# Patient Record
Sex: Female | Born: 1945
Health system: Southern US, Community
[De-identification: ages and names within clinical notes are randomized; demographics above are authoritative.]

## PROBLEM LIST (undated history)

## (undated) DIAGNOSIS — N319 Neuromuscular dysfunction of bladder, unspecified: Secondary | ICD-10-CM

## (undated) DIAGNOSIS — I82432 Acute embolism and thrombosis of left popliteal vein: Secondary | ICD-10-CM

## (undated) DIAGNOSIS — N189 Chronic kidney disease, unspecified: Secondary | ICD-10-CM

## (undated) DIAGNOSIS — M549 Dorsalgia, unspecified: Secondary | ICD-10-CM

## (undated) DIAGNOSIS — F419 Anxiety disorder, unspecified: Secondary | ICD-10-CM

## (undated) DIAGNOSIS — R269 Unspecified abnormalities of gait and mobility: Secondary | ICD-10-CM

## (undated) DIAGNOSIS — G0490 Encephalitis and encephalomyelitis, unspecified: Secondary | ICD-10-CM

## (undated) DIAGNOSIS — B0082 Herpes simplex myelitis: Secondary | ICD-10-CM

## (undated) HISTORY — PX: ABDOMINAL HYSTERECTOMY: SHX81

## (undated) HISTORY — PX: BLADDER REPAIR: SHX76

## (undated) HISTORY — DX: Chronic kidney disease, unspecified: N18.9

## (undated) HISTORY — PX: TUBAL LIGATION: SHX77

## (undated) HISTORY — DX: Unspecified abnormalities of gait and mobility: R26.9

---

## 1998-01-31 ENCOUNTER — Other Ambulatory Visit: Admission: RE | Admit: 1998-01-31 | Discharge: 1998-01-31 | Payer: Self-pay | Admitting: Obstetrics and Gynecology

## 1999-02-01 ENCOUNTER — Other Ambulatory Visit: Admission: RE | Admit: 1999-02-01 | Discharge: 1999-02-01 | Payer: Self-pay | Admitting: Obstetrics and Gynecology

## 1999-11-21 ENCOUNTER — Inpatient Hospital Stay (HOSPITAL_COMMUNITY): Admission: RE | Admit: 1999-11-21 | Discharge: 1999-11-24 | Payer: Self-pay | Admitting: Obstetrics and Gynecology

## 1999-11-21 ENCOUNTER — Encounter (INDEPENDENT_AMBULATORY_CARE_PROVIDER_SITE_OTHER): Payer: Self-pay | Admitting: Specialist

## 2000-11-19 ENCOUNTER — Other Ambulatory Visit: Admission: RE | Admit: 2000-11-19 | Discharge: 2000-11-19 | Payer: Self-pay | Admitting: Obstetrics and Gynecology

## 2001-02-26 ENCOUNTER — Encounter: Payer: Self-pay | Admitting: *Deleted

## 2001-02-26 ENCOUNTER — Ambulatory Visit (HOSPITAL_COMMUNITY): Admission: RE | Admit: 2001-02-26 | Discharge: 2001-02-26 | Payer: Self-pay | Admitting: *Deleted

## 2001-03-27 ENCOUNTER — Ambulatory Visit (HOSPITAL_COMMUNITY): Admission: RE | Admit: 2001-03-27 | Discharge: 2001-03-27 | Payer: Self-pay | Admitting: Gastroenterology

## 2001-03-27 ENCOUNTER — Encounter (INDEPENDENT_AMBULATORY_CARE_PROVIDER_SITE_OTHER): Payer: Self-pay

## 2001-12-16 ENCOUNTER — Other Ambulatory Visit: Admission: RE | Admit: 2001-12-16 | Discharge: 2001-12-16 | Payer: Self-pay | Admitting: Obstetrics and Gynecology

## 2002-05-27 ENCOUNTER — Encounter: Payer: Self-pay | Admitting: Obstetrics and Gynecology

## 2002-05-27 ENCOUNTER — Encounter: Admission: RE | Admit: 2002-05-27 | Discharge: 2002-05-27 | Payer: Self-pay | Admitting: Obstetrics and Gynecology

## 2002-12-29 ENCOUNTER — Other Ambulatory Visit: Admission: RE | Admit: 2002-12-29 | Discharge: 2002-12-29 | Payer: Self-pay | Admitting: Obstetrics and Gynecology

## 2004-07-02 ENCOUNTER — Encounter: Admission: RE | Admit: 2004-07-02 | Discharge: 2004-07-02 | Payer: Self-pay

## 2006-01-02 ENCOUNTER — Encounter: Admission: RE | Admit: 2006-01-02 | Discharge: 2006-01-02 | Payer: Self-pay | Admitting: Obstetrics and Gynecology

## 2007-02-04 ENCOUNTER — Encounter: Admission: RE | Admit: 2007-02-04 | Discharge: 2007-02-04 | Payer: Self-pay | Admitting: Obstetrics and Gynecology

## 2008-02-24 ENCOUNTER — Encounter: Admission: RE | Admit: 2008-02-24 | Discharge: 2008-02-24 | Payer: Self-pay | Admitting: Obstetrics and Gynecology

## 2008-02-26 ENCOUNTER — Encounter: Admission: RE | Admit: 2008-02-26 | Discharge: 2008-02-26 | Payer: Self-pay

## 2008-04-06 ENCOUNTER — Encounter: Admission: RE | Admit: 2008-04-06 | Discharge: 2008-04-21 | Payer: Self-pay

## 2010-09-25 ENCOUNTER — Encounter: Payer: Self-pay | Admitting: Obstetrics and Gynecology

## 2010-10-03 ENCOUNTER — Ambulatory Visit (HOSPITAL_COMMUNITY)
Admission: RE | Admit: 2010-10-03 | Discharge: 2010-10-03 | Payer: Self-pay | Source: Home / Self Care | Attending: Obstetrics and Gynecology | Admitting: Obstetrics and Gynecology

## 2011-01-19 NOTE — Op Note (Signed)
[facility]  Patient:    Katelyn Lamb, Katelyn Lamb                         MRN: [id number] Proc. Date: 11/21/99 Adm. Date:  [id number] Attending:  Frederich Balding                           Operative Report  PREOPERATIVE DIAGNOSIS:       Abnormal uterine bleeding.  Family history of ovarian cancer.  Urinary stress incontinence.  POSTOPERATIVE DIAGNOSIS:      Abnormal uterine bleeding.  Family history of ovarian cancer.  Urinary stress incontinence.  OPERATION:                    Total abdominal hysterectomy with bilateral salpingo-oophorectomy. Culdoplasty.  Retropubic suspension of the bladder using the Burch procedure.  Cystotomy with subsequent cystoscopy and placement of a suprapubic catheter.  SURGEON:                      Juluis Mire, M.D.  ASSISTANT:                    Beather Arbour. Thomasena Edis, M.D.  ANESTHESIA:                   General endotracheal anesthesia.  ESTIMATED BLOOD LOSS:         300 to 400 cc.  PACKS AND DRAINS:             None except for a suprapubic Foley.  BLOOD REPLACED:               None.  COMPLICATIONS:                None.  INDICATIONS:                  Dictated in the history and physical.  DESCRIPTION OF PROCEDURE:     The patient was taken to the OR and placed in the  supine position.  After a satisfactory level of general endotracheal anesthesia was obtained, the patient was placed in the modified dorsal lithotomy position using the Allen stirrups.  The abdomen, perineum, and vagina were prepped with Betadine and draped as a sterile field.  A low transverse skin incision was made with the knife and carried through the subcutaneous tissue.  The fascia was then entered  sharply.  The incision to the fascia was extended laterally.  The fascia was then taken off of the muscles superiorly and inferiorly.  The rectus muscles were separated in the midline.  The perineum was entered sharply and incision to peritoneum  extended both superiorly and inferiorly.  There was no evidence of injury to adjacent organs.  The appendix was visualized and noted to be normal.  The upper abdomen examination revealed both kidneys to be palpably normal.  The  liver was smooth.  I did not feel the gallbladder.  An OConnor-OSullivan retractor was put in place and the bowel contents were packed superiorly out of the pelvic cavity.  The uterus was of normal size and shape.  The tubes and ovaries  were unremarkable.  Both of the round ligaments were clamped, cut, and suture ligated with 0 Vicryl.  The retroperitoneal space on each side was developed. he ureters were identified and the ovarian vasculature was isolated above each ureter was clamped,  cut, and doubly ligated first with a free tie of 0 Vicryl and then a suture ligature of 0 Vicryl.  The bladder flap was developed.  The uterine vessels were clamped, cut, and suture ligated with 0 Vicryl.  Next, using the clamp, cut, and tie technique with suture ligatures of 0 Vicryl, the parametrium was serially separated from the sides of the uterus.  Both vaginal angles were clamped and cut and the intervening vaginal mucosa was excised.  The uterus was passed off the operative field.  Vaginal angles were secured with suture ligatures of 0 Vicryl. The intervening vaginal mucosa was closed with interrupted figure-of-eight sutures of 0 Vicryl.  We had good hemostasis.  The uterosacral ligaments were brought together in the midline with several interrupted sutures of 0 Vicryl completely  obliterating the cul-de-sac.  At this point in time we had adequate clear urine  output.  The pelvic cavity was thoroughly irrigated.  Hemostasis was excellent.  All pedicles were hemostatically intact.  The OConnor-OSullivan retractor and packs were removed.  The peritoneum was closed with a running suture of 2-0 Vicryl.  Attention was now turned to the retropubic suspension.  The  space of Retzius was easily developed.  A gloved hand was placed in the vaginal vault and the bladder was dissected bluntly off the underlying vaginal mucosa.  Bleeding was brought under control with the bipolar.  Next the urethrovesical angle was identified on each side and two sutures of 2-0 Prolene were put in place into the perivaginal  fascia and secured to the Coopers ligament.  Two further sutures of 2-0 Prolene  were put out further lateral in the perivaginal fascia and also secured to Coopers ligament.  All of these were tied down and we had excellent elevation of the urethrovesical angle.  The gloved hand was removed from the vagina at this point. The bladder was filled with saline.  A cystostomy incision was made and the cystoscope was introduced through that.  There was no evidence of sutures in the bladder mucosa.  She was given Indigo-Carmine and had free spillage of dye from  both ureteral orifices.  The cystostomy site was closed.  The mucosa was closed  with a running suture of 3-0 chromic.  The muscularis was brought together with  interrupted sutures of 3-0 chromic and a third layer was brought in using 3-0 Vicryl.  We redistended the bladder and there was no leakage from this area. The Bonanno catheter was put in place and secured to the skin.  At this point in time, the muscles were reapproximated with some 2-0 Vicryl.  The fascia was closed with a running suture of 0 PDS.  The skin was closed with staples and Steri-Strips. Sponge, needle, and instrument counts were correct by circulating nurse x 2. The patient tolerated the procedure well, was extubated, and transferred to the recovery room in good condition. DD:  11/22/99 TD:  11/22/99 Job: [id number]

## 2011-01-19 NOTE — Procedures (Signed)
[facility]  Patient:    Katelyn Lamb, Katelyn Lamb                           MRN: [id number] Proc. Date: 03/27/01 Attending:  Florencia Reasons, M.D. CC:         Willis Modena. Dreiling, M.D.  Juluis Mire, M.D.   Procedure Report  PROCEDURE:  Colonoscopy with polypectomy and biopsies.  INDICATIONS FOR PROCEDURE:  A 65 year old female for colon cancer screening and evaluation of sporadic intermittent diarrhea.  FINDINGS:  No evident colitis. Small rectosigmoid polyps.  DESCRIPTION OF PROCEDURE:  The nature, purpose and risk of the procedure had been discussed with the patient who provided written consent. Sedation for this procedure and the upper endoscopy which preceded it totaled fentanyl 175 mcg and Versed 18 mg IV without arrhythmias or desaturation. The large amount of medication required is attributed to the patients previous use of multiple psychotropic medications. The Olympus adjustable tension pediatric video colonoscope was advanced without much difficulty to the terminal ileum which had a normal appearance. Biopsies were obtained of it and pullback was then performed. The quality of the prep was very good and it is felt that all areas are well seen. On the way in and also during extraction of the scope, I encountered several small to medium size polyps in the sigmoid region. Most of these were about 2 x 4 mm in size and sessile in character. Two of these were removed by successive cold biopsies, and the two more distal ones were removed by snare technique, retrieving the polyp fragments from the snare polypectomies by suction through the scope. There was good hemostasis and no evidence of excessive cautery with the snare polypectomies.  Random mucosal biopsies were obtained along the length of the colon as well.  Retroflexion of the rectum was unremarkable. There were no large polyps, cancer, colitis or vascular malformations noted and I did not see  any diverticulosis.  The patient tolerated the procedure well and there were no apparent complications.  IMPRESSION:  Normal exam to the terminal ileum except for small to medium size rectosigmoid polyps.  No endoscopically-evident source for the patients IBS type diarrhea identified.  PLAN:  Await pathology on biopsies. DD:  03/27/01 TD:  03/27/01 Job: [id number]

## 2011-01-19 NOTE — Discharge Summary (Signed)
[facility]  Patient:    Katelyn Lamb, Katelyn Lamb                         MRN: [id number] Adm. Date:  [id number] Disc. Date: [id number] Attending:  Frederich Balding                           Discharge Summary  ADMISSION DIAGNOSES:          1. Anatomical urinary stress incontinence.                               2. Abnormal bleeding.                               3. Family history of ovarian cancer.  DISCHARGE DIAGNOSES:          1. Anatomical urinary stress incontinence.                               2. Abnormal bleeding.                               3. Family history of ovarian cancer.  PROCEDURE:                    Total abdominal hysterectomy with bilateral salpingo-oophorectomy.  Culdoplasty.  Retropubic suspension of the bladder using the Burch procedure.  Cystoscopy with placement of suprapubic catheter.  HISTORY OF PRESENT ILLNESS:   For complete history and physical, please see dictated note.  HOSPITAL COURSE:              The patient underwent above noted surgery. Pathology is still pending.  Postoperative hemoglobin was 11.1.  On her second postoperative day, we began clamping the suprapubic catheter.  She voided well and had minimal residuals.  On her third postoperative day, the suprapubic catheter was removed  intact.  At that time, she was tolerating a regular diet and ambulating without  difficulty.  She had normal bowel function, no active vaginal bleeding and incision was intact.  She was discharged home at that time.  COMPLICATIONS:                None were encountered during her stay in the hospital. The patient was discharged home in stable condition.  DISPOSITION:                  Routine postoperative instructions and orders were given.  She is to avoid heavy lifting, vaginal entrance, or driving a car. Discharged home on Tylox as needed for pain, continue Premarin hormone replacement therapy.  She is to call for active  vaginal bleeding.  She should call with increasing abdominal pain, nausea and vomiting, or elevated temperature. Follow up in the office will be in one week. DD:  11/24/99 TD:  11/24/99 Job: [id number]

## 2014-04-20 ENCOUNTER — Other Ambulatory Visit: Payer: Self-pay | Admitting: Gastroenterology

## 2014-04-20 DIAGNOSIS — R1033 Periumbilical pain: Secondary | ICD-10-CM

## 2014-04-26 ENCOUNTER — Ambulatory Visit
Admission: RE | Admit: 2014-04-26 | Discharge: 2014-04-26 | Disposition: A | Discharge status: Home / Self Care | Payer: No Typology Code available for payment source | Source: Ambulatory Visit | Attending: Gastroenterology | Admitting: Gastroenterology

## 2014-04-26 DIAGNOSIS — R1033 Periumbilical pain: Secondary | ICD-10-CM

## 2014-04-26 MED ORDER — IOHEXOL 300 MG/ML  SOLN
100.0000 mL | Freq: Once | INTRAMUSCULAR | Status: AC | PRN
  Administered 2014-04-26: 100 mL via INTRAVENOUS

## 2014-04-27 ENCOUNTER — Other Ambulatory Visit: Payer: Self-pay

## 2014-05-19 ENCOUNTER — Ambulatory Visit
Admission: RE | Admit: 2014-05-19 | Discharge: 2014-05-19 | Disposition: A | Discharge status: Home / Self Care | Payer: No Typology Code available for payment source | Source: Ambulatory Visit | Attending: Family Medicine | Admitting: Family Medicine

## 2014-05-19 ENCOUNTER — Emergency Department (HOSPITAL_COMMUNITY)
Admission: EM | Admit: 2014-05-19 | Discharge: 2014-05-19 | Disposition: A | Discharge status: Home / Self Care | Payer: Medicare Other | Attending: Emergency Medicine | Admitting: Emergency Medicine

## 2014-05-19 ENCOUNTER — Encounter (HOSPITAL_COMMUNITY): Payer: Self-pay | Admitting: Emergency Medicine

## 2014-05-19 ENCOUNTER — Emergency Department (HOSPITAL_COMMUNITY): Payer: Medicare Other

## 2014-05-19 ENCOUNTER — Other Ambulatory Visit: Payer: Self-pay | Admitting: Family Medicine

## 2014-05-19 DIAGNOSIS — Z87891 Personal history of nicotine dependence: Secondary | ICD-10-CM | POA: Insufficient documentation

## 2014-05-19 DIAGNOSIS — R52 Pain, unspecified: Secondary | ICD-10-CM

## 2014-05-19 DIAGNOSIS — Y92410 Unspecified street and highway as the place of occurrence of the external cause: Secondary | ICD-10-CM | POA: Diagnosis not present

## 2014-05-19 DIAGNOSIS — S298XXA Other specified injuries of thorax, initial encounter: Secondary | ICD-10-CM | POA: Diagnosis present

## 2014-05-19 DIAGNOSIS — Z79899 Other long term (current) drug therapy: Secondary | ICD-10-CM | POA: Diagnosis not present

## 2014-05-19 DIAGNOSIS — S2220XA Unspecified fracture of sternum, initial encounter for closed fracture: Secondary | ICD-10-CM | POA: Diagnosis not present

## 2014-05-19 DIAGNOSIS — Y9389 Activity, other specified: Secondary | ICD-10-CM | POA: Diagnosis not present

## 2014-05-19 LAB — I-STAT CHEM 8, ED
BUN: 18 mg/dL (ref 6–23)
Calcium, Ion: 1.12 mmol/L — ABNORMAL LOW (ref 1.13–1.30)
Chloride: 99 mEq/L (ref 96–112)
Creatinine, Ser: 0.9 mg/dL (ref 0.50–1.10)
Glucose, Bld: 107 mg/dL — ABNORMAL HIGH (ref 70–99)
HCT: 40 % (ref 36.0–46.0)
Hemoglobin: 13.6 g/dL (ref 12.0–15.0)
Potassium: 3.7 mEq/L (ref 3.7–5.3)
SODIUM: 136 meq/L — AB (ref 137–147)
TCO2: 31 mmol/L (ref 0–100)

## 2014-05-19 LAB — CBC WITH DIFFERENTIAL/PLATELET
BASOS ABS: 0 10*3/uL (ref 0.0–0.1)
Basophils Relative: 0 % (ref 0–1)
EOS PCT: 2 % (ref 0–5)
Eosinophils Absolute: 0.2 10*3/uL (ref 0.0–0.7)
HCT: 37.9 % (ref 36.0–46.0)
Hemoglobin: 12.4 g/dL (ref 12.0–15.0)
LYMPHS PCT: 35 % (ref 12–46)
Lymphs Abs: 2.8 10*3/uL (ref 0.7–4.0)
MCH: 31.1 pg (ref 26.0–34.0)
MCHC: 32.7 g/dL (ref 30.0–36.0)
MCV: 95 fL (ref 78.0–100.0)
Monocytes Absolute: 0.5 10*3/uL (ref 0.1–1.0)
Monocytes Relative: 7 % (ref 3–12)
NEUTROS ABS: 4.4 10*3/uL (ref 1.7–7.7)
Neutrophils Relative %: 56 % (ref 43–77)
PLATELETS: 187 10*3/uL (ref 150–400)
RBC: 3.99 MIL/uL (ref 3.87–5.11)
RDW: 13 % (ref 11.5–15.5)
WBC: 7.9 10*3/uL (ref 4.0–10.5)

## 2014-05-19 MED ORDER — IOHEXOL 350 MG/ML SOLN
100.0000 mL | Freq: Once | INTRAVENOUS | Status: AC | PRN
  Administered 2014-05-19: 100 mL via INTRAVENOUS
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
## 2014-06-24 ENCOUNTER — Ambulatory Visit (INDEPENDENT_AMBULATORY_CARE_PROVIDER_SITE_OTHER): Payer: No Typology Code available for payment source

## 2014-06-24 ENCOUNTER — Ambulatory Visit (INDEPENDENT_AMBULATORY_CARE_PROVIDER_SITE_OTHER): Payer: No Typology Code available for payment source | Admitting: Family Medicine

## 2014-06-24 VITALS — BP 130/88 | HR 104 | Temp 98.6°F | Resp 16 | Ht 65.77 in | Wt 149.8 lb

## 2014-06-24 DIAGNOSIS — M25561 Pain in right knee: Secondary | ICD-10-CM

## 2014-06-24 DIAGNOSIS — S82191A Other fracture of upper end of right tibia, initial encounter for closed fracture: Secondary | ICD-10-CM

## 2014-06-24 DIAGNOSIS — S82131A Displaced fracture of medial condyle of right tibia, initial encounter for closed fracture: Secondary | ICD-10-CM

## 2014-06-24 MED ORDER — HYDROCODONE-ACETAMINOPHEN 10-325 MG PO TABS: 1.0000 | ORAL_TABLET | Freq: Three times a day (TID) | ORAL | Status: DC | PRN

## 2014-06-24 MED ORDER — HYDROCODONE-ACETAMINOPHEN 10-325 MG PO TABS: 1.0000 | ORAL_TABLET | Freq: Three times a day (TID) | ORAL | Status: DC

## 2014-06-24 NOTE — Addendum Note (Signed)
Addended by: Lamar Blinks C on: 06/24/2014 10:14 PM   Modules accepted: Orders, Medications

## 2014-06-24 NOTE — Patient Instructions (Addendum)
I will call Dr. Graciella Freer tomorrow and ask them to see you asap. Per the radiologist you may have a fracture of your tibial plateau- however with your level of pain I am more suspicious that you do indeed have a fracture.   Keep the knee immobilizer on all the time, and do not bear weight on your knee.  Use your crutches or walker for support.  Keep the knee elevated and use ice when you can.   You can use the pain medication as needed, remember it will make you sleepy

## 2014-06-24 NOTE — Progress Notes (Addendum)
[facility] [address] [place] [address] [id number]  Date:  06/24/2014   Name:  Katelyn Lamb   DOB:  18-Sep-1945   MRN:  [id number]  PCP:  No PCP Per Patient    Chief Complaint: Knee Injury   History of Present Illness:  Katelyn Lamb is a 68 y.o. very pleasant female patient who presents with the following:  She is here today with a right knee injury.  She was putting her dog in the car- she did not see the open car door and struck it with her right knee.  She had a lot of pain right away.  Stumbled but did not fall. She has already had a lot of issues with her right knee and may be looking towards a knee replacement at some point.  She has not yet had any surgery on her knee but recently had synvysc injections per Dr. Ricki Rodriguez.  She is also seen by a spine specialist who periodically will rx her pain medication for her back.  She typically takes norco 10mg   There are no active problems to display for this patient.   History reviewed. No pertinent past medical history.  Past Surgical History  Procedure Laterality Date  . Cesarean section    . Tubal ligation    . Bladder repair    . Abdominal hysterectomy      History  Substance Use Topics  . Smoking status: Former Research scientist (life sciences)  . Smokeless tobacco: Not on file  . Alcohol Use: No    No family history on file.  Allergies  Allergen Reactions  . Demerol [Meperidine]     Hallucinations    Medication list has been reviewed and updated.  Current Outpatient Prescriptions on File Prior to Visit  Medication Sig Dispense Refill  . ALPRAZolam (XANAX) 1 MG tablet Take 1 mg by mouth 4 (four) times daily as needed for anxiety or sleep.       Marland Kitchen amphetamine-dextroamphetamine (ADDERALL XR) 30 MG 24 hr capsule Take 30 mg by mouth daily.       Marland Kitchen b complex vitamins tablet Take 1 tablet by mouth daily.      . calcium carbonate (OS-CAL) 600 MG TABS tablet Take 600 mg by mouth 2 (two) times daily with a meal.       . FLUoxetine (PROZAC) 40 MG capsule Take 40 mg by mouth every morning.      Marland Kitchen HYDROcodone-acetaminophen (NORCO) 10-325 MG per tablet Take 1 tablet by mouth 3 (three) times daily.      Marland Kitchen omega-3 acid ethyl esters (LOVAZA) 1 G capsule Take by mouth 2 (two) times daily.      . QUEtiapine (SEROQUEL) 50 MG tablet Take 25 mg by mouth at bedtime.       . vitamin C (ASCORBIC ACID) 500 MG tablet Take 500 mg by mouth daily.       No current facility-administered medications on file prior to visit.    Review of Systems:  As per HPI- otherwise negative.   Physical Examination: Filed Vitals:   06/24/14 1620  BP: 130/88  Pulse: 104  Temp: 98.6 F (37 C)  Resp: 16   Filed Vitals:   06/24/14 1620  Height: 5' 5.77" (1.671 m)  Weight: 149 lb 12.8 oz (67.949 kg)   Body mass index is 24.33 kg/(m^2). Ideal Body Weight: Weight in (lb) to have BMI = 25: 153.5  GEN: WDWN, NAD, Non-toxic, A & O x 3,  looks well, sitting in wheelchair with her husband HEENT: Atraumatic, Normocephalic. Neck supple. No masses, No LAD. Ears and Nose: No external deformity. CV: RRR, No M/G/R. No JVD. No thrill. No extra heart sounds. PULM: CTA B, no wheezes, crackles, rhonchi. No retractions. No resp. distress. No accessory muscle use. EXTR: No c/c/e NEURO she is quite willing to try and walk on her knee- I asked her to please not bear weight on it PSYCH: Normally interactive. Conversant. Not depressed or anxious appearing.  Calm demeanor.  Right knee: obvious bruising over the medial patella and medial tibial plateau.  She is quite tender in these areas.  She is holding knee in extension and resists any flexion of the knee as it is painful.  Kneecap is located.   Foot with normal sensation and cap refill   [facility] reading (PRIMARY) by  Dr. Lorelei Pont. Right knee: medial tibial plateau fracture RIGHT KNEE - COMPLETE 4+ VIEW  COMPARISON: None.  FINDINGS:  Spur formation involving all 3 joint compartments. Small to   moderate-sized effusion. Possible fracture through the far medial  aspect of the medial tibial plateau versus artifact due to  overlapping spur formation. Moderate medial joint space narrowing.  IMPRESSION:  1. Small effusion.  2. Fracture versus artifact at the base of a medial tibial spur.  3. Tricompartmental degenerative changes.    Assessment and Plan: Pain in right knee - Plan: DG Knee Complete 4 Views Right, HYDROcodone-acetaminophen (NORCO) 10-325 MG per tablet, HYDROcodone-acetaminophen (NORCO) 10-325 MG per tablet  Right medial tibial plateau fracture, closed, initial encounter - Plan: HYDROcodone-acetaminophen (NORCO) 10-325 MG per tablet, HYDROcodone-acetaminophen (NORCO) 10-325 MG per tablet  Likely right tibial plateau fracture.  Placed in a knee immobilizer, given crutches and rx for a walker See patient instructions for more details.   Will contact her ortho office tomorrow and ask them to see her  Hydrocodone for pain.  She states she is used to this medication, but cautioned her regarding excessive sedation if used with her xanax.  Advised her to use reduced doses of hydrocodone and/ or xanax if they are used in conjunction.    Please note pt only given one rx for #30 hydrocodone; I did have to order this twice due to printer problem but extra rx was destroyed.   Signed Lamar Blinks, MD

## 2014-06-25 ENCOUNTER — Other Ambulatory Visit: Payer: Self-pay | Admitting: Family Medicine

## 2014-07-01 ENCOUNTER — Telehealth: Payer: Self-pay

## 2014-07-01 DIAGNOSIS — S82141D Displaced bicondylar fracture of right tibia, subsequent encounter for closed fracture with routine healing: Secondary | ICD-10-CM

## 2014-07-01 NOTE — Telephone Encounter (Signed)
Pended shower chair. Please advise.

## 2014-07-01 NOTE — Telephone Encounter (Signed)
Patient was seen on Oct. 22 by Dr. Lorelei Pont. Husband [name] wants to know if she can have a rx for a bath chair because she can't not stand up while showering and doesn't want to use crutches while in the shower. He states he can pick up the rx because he doesn't know where to have our office send the rx to. He is not familiar with any medical supply companies. Cb# [phone] and he is on hippa form.

## 2014-07-01 NOTE — Telephone Encounter (Signed)
Called him back- he does not need the rx as insurance will not cover anyway. He is on his way to [facility] to buy the chair

## 2015-06-16 DIAGNOSIS — M47812 Spondylosis without myelopathy or radiculopathy, cervical region: Secondary | ICD-10-CM | POA: Insufficient documentation

## 2015-11-15 ENCOUNTER — Other Ambulatory Visit: Payer: Self-pay | Admitting: Gastroenterology

## 2015-11-15 DIAGNOSIS — K580 Irritable bowel syndrome with diarrhea: Secondary | ICD-10-CM | POA: Diagnosis not present

## 2015-11-15 DIAGNOSIS — Z86010 Personal history of colonic polyps: Secondary | ICD-10-CM | POA: Diagnosis not present

## 2015-11-15 DIAGNOSIS — R1033 Periumbilical pain: Secondary | ICD-10-CM

## 2015-11-15 DIAGNOSIS — R1031 Right lower quadrant pain: Secondary | ICD-10-CM | POA: Diagnosis not present

## 2015-11-15 DIAGNOSIS — K5730 Diverticulosis of large intestine without perforation or abscess without bleeding: Secondary | ICD-10-CM | POA: Diagnosis not present

## 2015-11-22 ENCOUNTER — Ambulatory Visit
Admission: RE | Admit: 2015-11-22 | Discharge: 2015-11-22 | Disposition: A | Discharge status: Home / Self Care | Payer: PPO | Source: Ambulatory Visit | Attending: Gastroenterology | Admitting: Gastroenterology

## 2015-11-22 DIAGNOSIS — R1031 Right lower quadrant pain: Secondary | ICD-10-CM | POA: Diagnosis not present

## 2015-11-22 DIAGNOSIS — R1033 Periumbilical pain: Secondary | ICD-10-CM

## 2015-11-22 MED ORDER — IOPAMIDOL (ISOVUE-300) INJECTION 61%
100.0000 mL | Freq: Once | INTRAVENOUS | Status: AC | PRN
  Administered 2015-11-22: 100 mL via INTRAVENOUS

## 2015-12-26 DIAGNOSIS — H6062 Unspecified chronic otitis externa, left ear: Secondary | ICD-10-CM | POA: Diagnosis not present

## 2015-12-26 DIAGNOSIS — H6122 Impacted cerumen, left ear: Secondary | ICD-10-CM | POA: Diagnosis not present

## 2015-12-26 DIAGNOSIS — J342 Deviated nasal septum: Secondary | ICD-10-CM | POA: Diagnosis not present

## 2015-12-29 DIAGNOSIS — H903 Sensorineural hearing loss, bilateral: Secondary | ICD-10-CM | POA: Diagnosis not present

## 2015-12-29 DIAGNOSIS — H6062 Unspecified chronic otitis externa, left ear: Secondary | ICD-10-CM | POA: Diagnosis not present

## 2015-12-29 DIAGNOSIS — H6122 Impacted cerumen, left ear: Secondary | ICD-10-CM | POA: Diagnosis not present

## 2016-01-03 DIAGNOSIS — M5136 Other intervertebral disc degeneration, lumbar region: Secondary | ICD-10-CM | POA: Diagnosis not present

## 2016-01-19 DIAGNOSIS — H6063 Unspecified chronic otitis externa, bilateral: Secondary | ICD-10-CM | POA: Diagnosis not present

## 2016-02-01 DIAGNOSIS — N390 Urinary tract infection, site not specified: Secondary | ICD-10-CM | POA: Diagnosis not present

## 2016-02-01 DIAGNOSIS — N816 Rectocele: Secondary | ICD-10-CM | POA: Diagnosis not present

## 2016-02-28 DIAGNOSIS — M25541 Pain in joints of right hand: Secondary | ICD-10-CM | POA: Diagnosis not present

## 2016-02-28 DIAGNOSIS — M1811 Unilateral primary osteoarthritis of first carpometacarpal joint, right hand: Secondary | ICD-10-CM | POA: Diagnosis not present

## 2016-03-14 DIAGNOSIS — Z6824 Body mass index (BMI) 24.0-24.9, adult: Secondary | ICD-10-CM | POA: Diagnosis not present

## 2016-03-14 DIAGNOSIS — Z01419 Encounter for gynecological examination (general) (routine) without abnormal findings: Secondary | ICD-10-CM | POA: Diagnosis not present

## 2016-03-14 DIAGNOSIS — Z1231 Encounter for screening mammogram for malignant neoplasm of breast: Secondary | ICD-10-CM | POA: Diagnosis not present

## 2016-05-25 DIAGNOSIS — H3589 Other specified retinal disorders: Secondary | ICD-10-CM | POA: Diagnosis not present

## 2016-05-25 DIAGNOSIS — H2513 Age-related nuclear cataract, bilateral: Secondary | ICD-10-CM | POA: Diagnosis not present

## 2016-05-25 DIAGNOSIS — H5203 Hypermetropia, bilateral: Secondary | ICD-10-CM | POA: Diagnosis not present

## 2016-06-12 DIAGNOSIS — N951 Menopausal and female climacteric states: Secondary | ICD-10-CM | POA: Diagnosis not present

## 2016-06-12 DIAGNOSIS — R61 Generalized hyperhidrosis: Secondary | ICD-10-CM | POA: Diagnosis not present

## 2016-06-12 DIAGNOSIS — F411 Generalized anxiety disorder: Secondary | ICD-10-CM | POA: Diagnosis not present

## 2016-06-12 DIAGNOSIS — Z79899 Other long term (current) drug therapy: Secondary | ICD-10-CM | POA: Diagnosis not present

## 2016-06-12 DIAGNOSIS — Z23 Encounter for immunization: Secondary | ICD-10-CM | POA: Diagnosis not present

## 2016-06-12 DIAGNOSIS — Z0000 Encounter for general adult medical examination without abnormal findings: Secondary | ICD-10-CM | POA: Diagnosis not present

## 2016-06-12 DIAGNOSIS — R51 Headache: Secondary | ICD-10-CM | POA: Diagnosis not present

## 2016-06-12 DIAGNOSIS — Z136 Encounter for screening for cardiovascular disorders: Secondary | ICD-10-CM | POA: Diagnosis not present

## 2016-06-13 DIAGNOSIS — M5136 Other intervertebral disc degeneration, lumbar region: Secondary | ICD-10-CM | POA: Diagnosis not present

## 2016-06-28 DIAGNOSIS — N952 Postmenopausal atrophic vaginitis: Secondary | ICD-10-CM | POA: Diagnosis not present

## 2016-07-03 DIAGNOSIS — H353130 Nonexudative age-related macular degeneration, bilateral, stage unspecified: Secondary | ICD-10-CM | POA: Diagnosis not present

## 2016-07-03 DIAGNOSIS — H25013 Cortical age-related cataract, bilateral: Secondary | ICD-10-CM | POA: Diagnosis not present

## 2016-07-03 DIAGNOSIS — H2511 Age-related nuclear cataract, right eye: Secondary | ICD-10-CM | POA: Diagnosis not present

## 2016-07-03 DIAGNOSIS — H02839 Dermatochalasis of unspecified eye, unspecified eyelid: Secondary | ICD-10-CM | POA: Diagnosis not present

## 2016-07-03 DIAGNOSIS — H2513 Age-related nuclear cataract, bilateral: Secondary | ICD-10-CM | POA: Diagnosis not present

## 2016-07-17 DIAGNOSIS — N952 Postmenopausal atrophic vaginitis: Secondary | ICD-10-CM | POA: Diagnosis not present

## 2016-07-19 ENCOUNTER — Other Ambulatory Visit: Payer: Self-pay | Admitting: Family Medicine

## 2016-07-19 DIAGNOSIS — G4452 New daily persistent headache (NDPH): Secondary | ICD-10-CM

## 2016-08-01 ENCOUNTER — Ambulatory Visit
Admission: RE | Admit: 2016-08-01 | Discharge: 2016-08-01 | Disposition: A | Discharge status: Home / Self Care | Payer: PPO | Source: Ambulatory Visit | Attending: Family Medicine | Admitting: Family Medicine

## 2016-08-01 DIAGNOSIS — R51 Headache: Secondary | ICD-10-CM | POA: Diagnosis not present

## 2016-08-01 DIAGNOSIS — G4452 New daily persistent headache (NDPH): Secondary | ICD-10-CM

## 2016-08-03 DIAGNOSIS — H2512 Age-related nuclear cataract, left eye: Secondary | ICD-10-CM | POA: Diagnosis not present

## 2016-08-03 DIAGNOSIS — H2513 Age-related nuclear cataract, bilateral: Secondary | ICD-10-CM | POA: Diagnosis not present

## 2016-08-03 DIAGNOSIS — H2511 Age-related nuclear cataract, right eye: Secondary | ICD-10-CM | POA: Diagnosis not present

## 2016-08-17 DIAGNOSIS — H2512 Age-related nuclear cataract, left eye: Secondary | ICD-10-CM | POA: Diagnosis not present

## 2016-08-17 DIAGNOSIS — H2513 Age-related nuclear cataract, bilateral: Secondary | ICD-10-CM | POA: Diagnosis not present

## 2016-08-23 DIAGNOSIS — M79604 Pain in right leg: Secondary | ICD-10-CM | POA: Diagnosis not present

## 2016-08-23 DIAGNOSIS — R252 Cramp and spasm: Secondary | ICD-10-CM | POA: Diagnosis not present

## 2016-08-23 DIAGNOSIS — M79606 Pain in leg, unspecified: Secondary | ICD-10-CM | POA: Diagnosis not present

## 2016-09-09 ENCOUNTER — Encounter (HOSPITAL_COMMUNITY): Payer: Self-pay | Admitting: Emergency Medicine

## 2016-09-09 ENCOUNTER — Emergency Department (HOSPITAL_COMMUNITY): Payer: PPO

## 2016-09-09 ENCOUNTER — Emergency Department (HOSPITAL_COMMUNITY)
Admission: EM | Admit: 2016-09-09 | Discharge: 2016-09-09 | Disposition: A | Discharge status: Home / Self Care | Payer: PPO | Attending: Emergency Medicine | Admitting: Emergency Medicine

## 2016-09-09 DIAGNOSIS — M545 Low back pain: Secondary | ICD-10-CM | POA: Diagnosis not present

## 2016-09-09 DIAGNOSIS — Z87891 Personal history of nicotine dependence: Secondary | ICD-10-CM | POA: Insufficient documentation

## 2016-09-09 DIAGNOSIS — M5416 Radiculopathy, lumbar region: Secondary | ICD-10-CM

## 2016-09-09 HISTORY — DX: Anxiety disorder, unspecified: F41.9

## 2016-09-09 HISTORY — DX: Dorsalgia, unspecified: M54.9

## 2016-09-09 LAB — BASIC METABOLIC PANEL
Anion gap: 11 (ref 5–15)
BUN: 10 mg/dL (ref 6–20)
CALCIUM: 9.5 mg/dL (ref 8.9–10.3)
CO2: 27 mmol/L (ref 22–32)
CREATININE: 0.96 mg/dL (ref 0.44–1.00)
Chloride: 98 mmol/L — ABNORMAL LOW (ref 101–111)
GFR calc Af Amer: >60 mL/min (ref >60)
GFR, EST NON AFRICAN AMERICAN: 59 mL/min — AB (ref >60)
Glucose, Bld: 106 mg/dL — ABNORMAL HIGH (ref 65–99)
Potassium: 4.1 mmol/L (ref 3.5–5.1)
SODIUM: 136 mmol/L (ref 135–145)

## 2016-09-09 LAB — CBC
HCT: 41.4 % (ref 36.0–46.0)
Hemoglobin: 13.7 g/dL (ref 12.0–15.0)
MCH: 31.4 pg (ref 26.0–34.0)
MCHC: 33.1 g/dL (ref 30.0–36.0)
MCV: 95 fL (ref 78.0–100.0)
PLATELETS: 222 10*3/uL (ref 150–400)
RBC: 4.36 MIL/uL (ref 3.87–5.11)
RDW: 12.7 % (ref 11.5–15.5)
WBC: 6.5 10*3/uL (ref 4.0–10.5)

## 2016-09-09 LAB — I-STAT TROPONIN, ED: Troponin i, poc: 0 ng/mL (ref 0.00–0.08)

## 2016-09-09 MED ORDER — SODIUM CHLORIDE 0.9 % IV BOLUS (SEPSIS)
500.0000 mL | Freq: Once | INTRAVENOUS | Status: AC
  Administered 2016-09-09: 500 mL via INTRAVENOUS

## 2016-09-09 MED ORDER — KETOROLAC TROMETHAMINE 10 MG PO TABS: 10.0000 mg | ORAL_TABLET | Freq: Four times a day (QID) | ORAL | 0 refills | Status: DC | PRN

## 2016-09-09 MED ORDER — MORPHINE SULFATE (PF) 4 MG/ML IV SOLN
4.0000 mg | Freq: Once | INTRAVENOUS | Status: AC
  Administered 2016-09-09: 4 mg via INTRAVENOUS
  Filled 2016-09-09: qty 1

## 2016-09-09 MED ORDER — MORPHINE SULFATE 30 MG PO TABS: 30.0000 mg | ORAL_TABLET | Freq: Four times a day (QID) | ORAL | 0 refills | Status: DC | PRN

## 2016-09-09 MED ORDER — ONDANSETRON HCL 4 MG/2ML IJ SOLN
4.0000 mg | Freq: Once | INTRAMUSCULAR | Status: AC
  Administered 2016-09-09: 4 mg via INTRAVENOUS
  Filled 2016-09-09: qty 2

## 2016-09-09 MED ORDER — KETOROLAC TROMETHAMINE 30 MG/ML IJ SOLN
30.0000 mg | Freq: Once | INTRAMUSCULAR | Status: AC
  Administered 2016-09-09: 30 mg via INTRAVENOUS
  Filled 2016-09-09: qty 1

## 2016-09-09 NOTE — Discharge Instructions (Signed)
Prescription for oral Toradol and oral morphine. Call Dr. Nelva Bush in the morning for follow-up

## 2016-09-09 NOTE — ED Triage Notes (Signed)
Pt reports chronic lower back pain- placed a heating pad on lower back/L hip area on 12/23 and thought it would cut off automatically and it didn't.  Since then she has had numbness/tingling in L hip that radiates to epigastric area.  Denies epigastric pain- only tingling.  Denies sob, nausea, and vomiting.  Since Tuesday, numbness/tingling radiates down L leg to toes.

## 2016-09-09 NOTE — ED Provider Notes (Signed)
[facility] DEPT Provider Note   CSN: [id number] Arrival date & time: 09/09/16  1553     History   Chief Complaint Chief Complaint  Patient presents with  . Back Pain  . numbness  . Tingling    HPI Katelyn Lamb is a 71 y.o. female.  Status post chronic low back pain with radicular symptoms. She sees Dr. Nelva Bush locally for epidural injections. She now complains of pain in the left lower back c radiation to the buttocks and to the left lower leg. Her leg feels numb and tingling. No frank bowel or bladder incontinence. She is able to ambulate. No foot drop. Severity of pain is moderate. She has been taking Vicodin at home with minimal relief.      Past Medical History:  Diagnosis Date  . Anxiety   . Back pain     There are no active problems to display for this patient.   Past Surgical History:  Procedure Laterality Date  . ABDOMINAL HYSTERECTOMY    . BLADDER REPAIR    . CESAREAN SECTION    . TUBAL LIGATION      OB History    No data available       Home Medications    Prior to Admission medications   Medication Sig Start Date End Date Taking? Authorizing Provider  ALPRAZolam Duanne Moron) 1 MG tablet Take 1 mg by mouth 4 (four) times daily as needed for anxiety or sleep.  03/23/14   Historical Provider, MD  amphetamine-dextroamphetamine (ADDERALL XR) 30 MG 24 hr capsule Take 30 mg by mouth daily.  04/27/14   Historical Provider, MD  b complex vitamins tablet Take 1 tablet by mouth daily.    Historical Provider, MD  calcium carbonate (OS-CAL) 600 MG TABS tablet Take 600 mg by mouth 2 (two) times daily with a meal.    Historical Provider, MD  FLUoxetine (PROZAC) 40 MG capsule Take 40 mg by mouth every morning.    Historical Provider, MD  HYDROcodone-acetaminophen (NORCO) 10-325 MG per tablet Take 1 tablet by mouth every 8 (eight) hours as needed for moderate pain. 06/24/14   Gay Filler Copland, MD  ketorolac (TORADOL) 10 MG tablet Take 1 tablet (10 mg total) by mouth  every 6 (six) hours as needed. 09/09/16   Nat Christen, MD  morphine (MSIR) 30 MG tablet Take 1 tablet (30 mg total) by mouth every 6 (six) hours as needed for severe pain. 09/09/16   Nat Christen, MD  omega-3 acid ethyl esters (LOVAZA) 1 G capsule Take by mouth 2 (two) times daily.    Historical Provider, MD  QUEtiapine (SEROQUEL) 50 MG tablet Take 25 mg by mouth at bedtime.  04/09/14   Historical Provider, MD  vitamin C (ASCORBIC ACID) 500 MG tablet Take 500 mg by mouth daily.    Historical Provider, MD    Family History No family history on file.  Social History Social History  Substance Use Topics  . Smoking status: Former Research scientist (life sciences)  . Smokeless tobacco: Never Used  . Alcohol use No     Allergies   Demerol [meperidine]   Review of Systems Review of Systems  All other systems reviewed and are negative.    Physical Exam Updated Vital Signs BP 93/75 (BP Location: Right Arm)   Pulse 82   Temp 97.5 F (36.4 C) (Oral)   Resp 18   SpO2 98%   Physical Exam  Constitutional: She appears well-developed and well-nourished.  HENT:  Head: Normocephalic and atraumatic.  Eyes: Conjunctivae are normal.  Neck: Neck supple.  Cardiovascular: Normal rate and regular rhythm.   Pulmonary/Chest: Effort normal and breath sounds normal.  Abdominal: Soft. Bowel sounds are normal.  Musculoskeletal: Normal range of motion.  Neurological: She is alert.  Tender over the sciatic nerve. Pain with straight leg raise left greater than right  Skin: Skin is warm and dry.  Psychiatric: She has a normal mood and affect. Her behavior is normal.  Nursing note and vitals reviewed.    ED Treatments / Results  Labs (all labs ordered are listed, but only abnormal results are displayed) Labs Reviewed  BASIC METABOLIC PANEL - Abnormal; Notable for the following:       Result Value   Chloride 98 (*)    Glucose, Bld 106 (*)    GFR calc non Af Amer 59 (*)    All other components within normal limits  CBC    I-STAT TROPOININ, ED    EKG  EKG Interpretation  Date/Time:  Sunday September 09 2016 16:09:52 EST Ventricular Rate:  88 PR Interval:  152 QRS Duration: 68 QT Interval:  346 QTC Calculation: 418 R Axis:   61 Text Interpretation:  Normal sinus rhythm Right atrial enlargement Low voltage QRS Borderline ECG Confirmed by Naser Schuld  MD, Darriel Utter ([id number]) on 09/09/2016 5:42:43 PM       Radiology Dg Lumbar Spine Complete  Result Date: 09/09/2016 CLINICAL DATA:  Low back pain with left leg pain EXAM: LUMBAR SPINE - COMPLETE 4+ VIEW COMPARISON:  Lumbar MRI 02/26/2008 FINDINGS: 5 mm anterior slip L4-L5 has progressed in the interval. Remaining alignment normal. Negative for fracture or pars defect. Facet degeneration at L4-5. Disc spaces intact. IMPRESSION: Grade 1 anterolisthesis L4-5 has progressed since 2009. No acute abnormality Electronically Signed   By: Franchot Gallo M.D.   On: 09/09/2016 18:59    Procedures Procedures (including critical care time)  Medications Ordered in ED Medications  ondansetron (ZOFRAN) injection 4 mg (4 mg Intravenous Given 09/09/16 1827)  sodium chloride 0.9 % bolus 500 mL (0 mLs Intravenous Stopped 09/09/16 1900)  ketorolac (TORADOL) 30 MG/ML injection 30 mg (30 mg Intravenous Given 09/09/16 1827)  morphine 4 MG/ML injection 4 mg (4 mg Intravenous Given 09/09/16 1826)     Initial Impression / Assessment and Plan / ED Course  I have reviewed the triage vital signs and the nursing notes.  Pertinent labs & imaging results that were available during my care of the patient were reviewed by me and considered in my medical decision making (see chart for details).  Clinical Course     Patient has acute on chronic low back pain with radicular symptoms. Plain films of lumbar spine show grade 1 anterolisthesis L4-L5.  She was given intravenous pain management in the ED which seemed to help. Discussed with orthopedic doctor on-call for [facility] Dr. Nelva Bush who agreed with  treatment plan. Discharge medication Toradol 10 mg by mouth and MSIR 30 mg. She will call orthopedics in the morning.  Final Clinical Impressions(s) / ED Diagnoses   Final diagnoses:  Lumbar back pain with radiculopathy affecting left lower extremity    New Prescriptions New Prescriptions   KETOROLAC (TORADOL) 10 MG TABLET    Take 1 tablet (10 mg total) by mouth every 6 (six) hours as needed.   MORPHINE (MSIR) 30 MG TABLET    Take 1 tablet (30 mg total) by mouth every 6 (six) hours as needed for severe pain.     Nat Christen,  MD 09/09/16 2101

## 2016-09-09 NOTE — ED Notes (Signed)
Labs were attempted by Quita Skye, EMT.  Clarise Cruz, phlebotomy notified of labs needed.

## 2016-09-09 NOTE — ED Notes (Signed)
Pt to xray

## 2016-09-10 DIAGNOSIS — M5416 Radiculopathy, lumbar region: Secondary | ICD-10-CM | POA: Diagnosis not present

## 2016-09-14 ENCOUNTER — Encounter (HOSPITAL_COMMUNITY): Payer: Self-pay | Admitting: Emergency Medicine

## 2016-09-14 DIAGNOSIS — M5416 Radiculopathy, lumbar region: Secondary | ICD-10-CM | POA: Diagnosis not present

## 2016-09-14 DIAGNOSIS — Z87891 Personal history of nicotine dependence: Secondary | ICD-10-CM | POA: Diagnosis not present

## 2016-09-14 DIAGNOSIS — G588 Other specified mononeuropathies: Secondary | ICD-10-CM | POA: Diagnosis not present

## 2016-09-14 DIAGNOSIS — M545 Low back pain: Secondary | ICD-10-CM | POA: Diagnosis not present

## 2016-09-14 NOTE — ED Triage Notes (Signed)
Patient with known bulging disc and numbness in bilateral legs.  Patient had MRI today, was told that a disc is pressing on a nerve.  Patient with pain that is uncontrolled at this time.  She has been given pain meds and prednisone with no effect.  She has two prescriptions for pain meds, morphine and hydromorphone and is mixing them.

## 2016-09-15 ENCOUNTER — Emergency Department (HOSPITAL_COMMUNITY)
Admission: EM | Admit: 2016-09-15 | Discharge: 2016-09-15 | Disposition: A | Discharge status: Home / Self Care | Payer: PPO | Attending: Emergency Medicine | Admitting: Emergency Medicine

## 2016-09-15 DIAGNOSIS — M5416 Radiculopathy, lumbar region: Secondary | ICD-10-CM

## 2016-09-15 MED ORDER — KETOROLAC TROMETHAMINE 60 MG/2ML IM SOLN
30.0000 mg | Freq: Once | INTRAMUSCULAR | Status: AC
  Administered 2016-09-15: 30 mg via INTRAMUSCULAR
  Filled 2016-09-15: qty 2

## 2016-09-15 MED ORDER — DIAZEPAM 5 MG PO TABS
5.0000 mg | ORAL_TABLET | Freq: Once | ORAL | Status: AC
  Administered 2016-09-15: 5 mg via ORAL
  Filled 2016-09-15: qty 1

## 2016-09-15 MED ORDER — ACETAMINOPHEN 500 MG PO TABS
1000.0000 mg | ORAL_TABLET | Freq: Once | ORAL | Status: AC
  Administered 2016-09-15: 1000 mg via ORAL
  Filled 2016-09-15: qty 2

## 2016-09-15 MED ORDER — DIAZEPAM 5 MG PO TABS: 5.0000 mg | ORAL_TABLET | Freq: Two times a day (BID) | ORAL | 0 refills | Status: DC

## 2016-09-15 NOTE — Discharge Instructions (Signed)

## 2016-09-15 NOTE — ED Provider Notes (Signed)
[facility] DEPT Provider Note   CSN: [id number] Arrival date & time: 09/14/16  2329     History   Chief Complaint Chief Complaint  Patient presents with  . Back Pain    HPI Katelyn Lamb is a 71 y.o. female.  71 yo F with a chief complaints of low back pain. This been going on for a while. She is seen by [facility] orthopedics. Had an MRI done as an outpatient yesterday and was found to have significant stenosis. She was started on gabapentin, prednisone and 2 mg Dilaudid tablets. Patient felt that her pain was still severely uncontrolled and came to the ED. While she was waiting in the ED for 7 hours her pain is somewhat improved. She has not been taking Tylenol or NSAIDs with this.   The history is provided by the patient and the spouse.  Back Pain   This is a new problem. The current episode started more than 2 days ago. The problem occurs constantly. The problem has been rapidly worsening. The pain is associated with no known injury. The pain is present in the lumbar spine. The quality of the pain is described as stabbing and shooting. The pain radiates to the left foot and right foot. The pain is at a severity of 10/10. The pain is severe. The symptoms are aggravated by bending and twisting. Pertinent negatives include no chest pain, no fever, no headaches and no dysuria. Treatments tried: narcotics, gabapentin. The treatment provided moderate relief.    Past Medical History:  Diagnosis Date  . Anxiety   . Back pain     There are no active problems to display for this patient.   Past Surgical History:  Procedure Laterality Date  . ABDOMINAL HYSTERECTOMY    . BLADDER REPAIR    . CESAREAN SECTION    . TUBAL LIGATION      OB History    No data available       Home Medications    Prior to Admission medications   Medication Sig Start Date End Date Taking? Authorizing Provider  ALPRAZolam Duanne Moron) 1 MG tablet Take 1 mg by mouth 4 (four) times daily as needed for  anxiety or sleep.  03/23/14   Historical Provider, MD  amphetamine-dextroamphetamine (ADDERALL XR) 30 MG 24 hr capsule Take 30 mg by mouth daily.  04/27/14   Historical Provider, MD  b complex vitamins tablet Take 1 tablet by mouth daily.    Historical Provider, MD  calcium carbonate (OS-CAL) 600 MG TABS tablet Take 600 mg by mouth 2 (two) times daily with a meal.    Historical Provider, MD  diazepam (VALIUM) 5 MG tablet Take 1 tablet (5 mg total) by mouth 2 (two) times daily. 09/15/16   Deno Etienne, DO  FLUoxetine (PROZAC) 40 MG capsule Take 40 mg by mouth every morning.    Historical Provider, MD  HYDROcodone-acetaminophen (NORCO) 10-325 MG per tablet Take 1 tablet by mouth every 8 (eight) hours as needed for moderate pain. 06/24/14   Gay Filler Copland, MD  ketorolac (TORADOL) 10 MG tablet Take 1 tablet (10 mg total) by mouth every 6 (six) hours as needed. 09/09/16   Nat Christen, MD  morphine (MSIR) 30 MG tablet Take 1 tablet (30 mg total) by mouth every 6 (six) hours as needed for severe pain. 09/09/16   Nat Christen, MD  omega-3 acid ethyl esters (LOVAZA) 1 G capsule Take by mouth 2 (two) times daily.    Historical Provider, MD  QUEtiapine (SEROQUEL) 50 MG tablet Take 25 mg by mouth at bedtime.  04/09/14   Historical Provider, MD  vitamin C (ASCORBIC ACID) 500 MG tablet Take 500 mg by mouth daily.    Historical Provider, MD    Family History History reviewed. No pertinent family history.  Social History Social History  Substance Use Topics  . Smoking status: Former Research scientist (life sciences)  . Smokeless tobacco: Never Used  . Alcohol use No     Allergies   Demerol [meperidine]   Review of Systems Review of Systems  Constitutional: Negative for chills and fever.  HENT: Negative for congestion and rhinorrhea.   Eyes: Negative for redness and visual disturbance.  Respiratory: Negative for shortness of breath and wheezing.   Cardiovascular: Negative for chest pain and palpitations.  Gastrointestinal: Negative  for nausea and vomiting.  Genitourinary: Negative for dysuria and urgency.  Musculoskeletal: Positive for back pain. Negative for arthralgias and myalgias.  Skin: Negative for pallor and wound.  Neurological: Negative for dizziness and headaches.     Physical Exam Updated Vital Signs BP 109/74 (BP Location: Right Arm)   Pulse 96   Temp 98.3 F (36.8 C) (Oral)   Resp 18   SpO2 100%   Physical Exam  Constitutional: She is oriented to person, place, and time. She appears well-developed and well-nourished. No distress.  HENT:  Head: Normocephalic and atraumatic.  Eyes: EOM are normal. Pupils are equal, round, and reactive to light.  Neck: Normal range of motion. Neck supple.  Cardiovascular: Normal rate and regular rhythm.  Exam reveals no gallop and no friction rub.   No murmur heard. Pulmonary/Chest: Effort normal. She has no wheezes. She has no rales.  Abdominal: Soft. She exhibits no distension and no mass. There is no tenderness. There is no guarding.  Musculoskeletal: She exhibits tenderness (worse to the L SI joint area). She exhibits no edema.  Pulse motor and sensation is intact to bilateral lower extremities. Patient is able to ambulate without difficulty.  Neurological: She is alert and oriented to person, place, and time.  Skin: Skin is warm and dry. She is not diaphoretic.  Psychiatric: She has a normal mood and affect. Her behavior is normal.  Nursing note and vitals reviewed.    ED Treatments / Results  Labs (all labs ordered are listed, but only abnormal results are displayed) Labs Reviewed - No data to display  EKG  EKG Interpretation None       Radiology No results found.  Procedures Procedures (including critical care time)  Medications Ordered in ED Medications  acetaminophen (TYLENOL) tablet 1,000 mg (not administered)  ketorolac (TORADOL) injection 30 mg (not administered)  diazepam (VALIUM) tablet 5 mg (not administered)     Initial  Impression / Assessment and Plan / ED Course  I have reviewed the triage vital signs and the nursing notes.  Pertinent labs & imaging results that were available during my care of the patient were reviewed by me and considered in my medical decision making (see chart for details).  Clinical Course     71 yo F With a chief complaint of low back pain. Patient has had an MRI this morning. Will attempt to treat the patient's pain here. Patient has some spasm on exam will give a prescription for Valium. Ortho follow-up.  6:00 AM:  I have discussed the diagnosis/risks/treatment options with the patient and family and believe the pt to be eligible for discharge home to follow-up with Ortho. We also discussed returning  to the ED immediately if new or worsening sx occur. We discussed the sx which are most concerning (e.g., sudden worsening pain, fever, inability to tolerate by mouth, loss of bowel or bladder) that necessitate immediate return. Medications administered to the patient during their visit and any new prescriptions provided to the patient are listed below.  Medications given during this visit Medications  acetaminophen (TYLENOL) tablet 1,000 mg (not administered)  ketorolac (TORADOL) injection 30 mg (not administered)  diazepam (VALIUM) tablet 5 mg (not administered)     The patient appears reasonably screen and/or stabilized for discharge and I doubt any other medical condition or other [facility] requiring further screening, evaluation, or treatment in the ED at this time prior to discharge.    Final Clinical Impressions(s) / ED Diagnoses   Final diagnoses:  Lumbar nerve root impingement    New Prescriptions New Prescriptions   DIAZEPAM (VALIUM) 5 MG TABLET    Take 1 tablet (5 mg total) by mouth 2 (two) times daily.     Deno Etienne, DO 09/15/16 0600

## 2016-09-17 DIAGNOSIS — M5416 Radiculopathy, lumbar region: Secondary | ICD-10-CM | POA: Diagnosis not present

## 2016-09-17 DIAGNOSIS — G894 Chronic pain syndrome: Secondary | ICD-10-CM | POA: Diagnosis not present

## 2016-09-17 DIAGNOSIS — M5136 Other intervertebral disc degeneration, lumbar region: Secondary | ICD-10-CM | POA: Diagnosis not present

## 2016-09-20 ENCOUNTER — Encounter (HOSPITAL_COMMUNITY): Payer: Self-pay | Admitting: Emergency Medicine

## 2016-09-20 ENCOUNTER — Emergency Department (HOSPITAL_COMMUNITY): Payer: PPO

## 2016-09-20 ENCOUNTER — Inpatient Hospital Stay (HOSPITAL_COMMUNITY)
Admission: EM | Admit: 2016-09-20 | Discharge: 2016-09-26 | DRG: 098 | Disposition: A | Discharge status: Home Health | Payer: PPO | Attending: Internal Medicine | Admitting: Internal Medicine

## 2016-09-20 ENCOUNTER — Ambulatory Visit: Payer: No Typology Code available for payment source | Admitting: Neurology

## 2016-09-20 DIAGNOSIS — M5489 Other dorsalgia: Secondary | ICD-10-CM | POA: Diagnosis present

## 2016-09-20 DIAGNOSIS — M545 Low back pain: Secondary | ICD-10-CM | POA: Diagnosis not present

## 2016-09-20 DIAGNOSIS — R159 Full incontinence of feces: Secondary | ICD-10-CM | POA: Diagnosis present

## 2016-09-20 DIAGNOSIS — F419 Anxiety disorder, unspecified: Secondary | ICD-10-CM | POA: Diagnosis not present

## 2016-09-20 DIAGNOSIS — Z79891 Long term (current) use of opiate analgesic: Secondary | ICD-10-CM | POA: Diagnosis not present

## 2016-09-20 DIAGNOSIS — G0491 Myelitis, unspecified: Secondary | ICD-10-CM

## 2016-09-20 DIAGNOSIS — R140 Abdominal distension (gaseous): Secondary | ICD-10-CM | POA: Diagnosis not present

## 2016-09-20 DIAGNOSIS — Z79899 Other long term (current) drug therapy: Secondary | ICD-10-CM | POA: Diagnosis not present

## 2016-09-20 DIAGNOSIS — M4802 Spinal stenosis, cervical region: Secondary | ICD-10-CM | POA: Diagnosis not present

## 2016-09-20 DIAGNOSIS — E876 Hypokalemia: Secondary | ICD-10-CM | POA: Diagnosis present

## 2016-09-20 DIAGNOSIS — B0082 Herpes simplex myelitis: Secondary | ICD-10-CM | POA: Diagnosis not present

## 2016-09-20 DIAGNOSIS — R32 Unspecified urinary incontinence: Secondary | ICD-10-CM | POA: Diagnosis present

## 2016-09-20 DIAGNOSIS — B029 Zoster without complications: Secondary | ICD-10-CM | POA: Diagnosis present

## 2016-09-20 DIAGNOSIS — M5124 Other intervertebral disc displacement, thoracic region: Secondary | ICD-10-CM | POA: Diagnosis not present

## 2016-09-20 DIAGNOSIS — M5126 Other intervertebral disc displacement, lumbar region: Secondary | ICD-10-CM | POA: Diagnosis not present

## 2016-09-20 DIAGNOSIS — G373 Acute transverse myelitis in demyelinating disease of central nervous system: Secondary | ICD-10-CM | POA: Diagnosis not present

## 2016-09-20 DIAGNOSIS — Z888 Allergy status to other drugs, medicaments and biological substances status: Secondary | ICD-10-CM | POA: Diagnosis not present

## 2016-09-20 DIAGNOSIS — D72829 Elevated white blood cell count, unspecified: Secondary | ICD-10-CM | POA: Diagnosis not present

## 2016-09-20 DIAGNOSIS — G0490 Encephalitis and encephalomyelitis, unspecified: Secondary | ICD-10-CM | POA: Diagnosis not present

## 2016-09-20 DIAGNOSIS — B028 Zoster with other complications: Secondary | ICD-10-CM | POA: Diagnosis present

## 2016-09-20 DIAGNOSIS — E871 Hypo-osmolality and hyponatremia: Secondary | ICD-10-CM | POA: Diagnosis not present

## 2016-09-20 DIAGNOSIS — Z90710 Acquired absence of both cervix and uterus: Secondary | ICD-10-CM | POA: Diagnosis not present

## 2016-09-20 DIAGNOSIS — Z880 Allergy status to penicillin: Secondary | ICD-10-CM

## 2016-09-20 DIAGNOSIS — R200 Anesthesia of skin: Secondary | ICD-10-CM | POA: Diagnosis not present

## 2016-09-20 DIAGNOSIS — R9089 Other abnormal findings on diagnostic imaging of central nervous system: Secondary | ICD-10-CM

## 2016-09-20 DIAGNOSIS — M549 Dorsalgia, unspecified: Secondary | ICD-10-CM | POA: Diagnosis not present

## 2016-09-20 DIAGNOSIS — Z87891 Personal history of nicotine dependence: Secondary | ICD-10-CM

## 2016-09-20 DIAGNOSIS — Z881 Allergy status to other antibiotic agents status: Secondary | ICD-10-CM

## 2016-09-20 DIAGNOSIS — B0224 Postherpetic myelitis: Secondary | ICD-10-CM | POA: Diagnosis not present

## 2016-09-20 DIAGNOSIS — T380X5A Adverse effect of glucocorticoids and synthetic analogues, initial encounter: Secondary | ICD-10-CM | POA: Diagnosis not present

## 2016-09-20 DIAGNOSIS — M5416 Radiculopathy, lumbar region: Secondary | ICD-10-CM | POA: Diagnosis not present

## 2016-09-20 LAB — BASIC METABOLIC PANEL
ANION GAP: 15 (ref 5–15)
BUN: 24 mg/dL — ABNORMAL HIGH (ref 6–20)
CO2: 23 mmol/L (ref 22–32)
Calcium: 10 mg/dL (ref 8.9–10.3)
Chloride: 96 mmol/L — ABNORMAL LOW (ref 101–111)
Creatinine, Ser: 1 mg/dL (ref 0.44–1.00)
GFR calc Af Amer: >60 mL/min (ref >60)
GFR, EST NON AFRICAN AMERICAN: 57 mL/min — AB (ref >60)
GLUCOSE: 70 mg/dL (ref 65–99)
Potassium: 3.9 mmol/L (ref 3.5–5.1)
SODIUM: 134 mmol/L — AB (ref 135–145)

## 2016-09-20 LAB — CBC WITH DIFFERENTIAL/PLATELET
BASOS ABS: 0 10*3/uL (ref 0.0–0.1)
Basophils Relative: 0 %
Eosinophils Absolute: 0.1 10*3/uL (ref 0.0–0.7)
Eosinophils Relative: 1 %
HEMATOCRIT: 47.4 % — AB (ref 36.0–46.0)
Hemoglobin: 15.8 g/dL — ABNORMAL HIGH (ref 12.0–15.0)
Lymphocytes Relative: 23 %
Lymphs Abs: 2.5 10*3/uL (ref 0.7–4.0)
MCH: 31.7 pg (ref 26.0–34.0)
MCHC: 33.3 g/dL (ref 30.0–36.0)
MCV: 95 fL (ref 78.0–100.0)
Monocytes Absolute: 0.9 10*3/uL (ref 0.1–1.0)
Monocytes Relative: 8 %
NEUTROS ABS: 7.4 10*3/uL (ref 1.7–7.7)
Neutrophils Relative %: 68 %
Platelets: 272 10*3/uL (ref 150–400)
RBC: 4.99 MIL/uL (ref 3.87–5.11)
RDW: 12.9 % (ref 11.5–15.5)
WBC: 10.9 10*3/uL — AB (ref 4.0–10.5)

## 2016-09-20 LAB — SEDIMENTATION RATE: Sed Rate: 2 mm/hr (ref 0–22)

## 2016-09-20 LAB — CK: Total CK: 200 U/L (ref 38–234)

## 2016-09-20 MED ORDER — DIPHENHYDRAMINE HCL 50 MG/ML IJ SOLN
12.5000 mg | INTRAMUSCULAR | Status: DC | PRN
Start: 2016-09-20 — End: 2016-09-26

## 2016-09-20 MED ORDER — VITAMIN D 1000 UNITS PO TABS
1000.0000 [IU] | ORAL_TABLET | Freq: Every day | ORAL | Status: DC
  Administered 2016-09-21 – 2016-09-26 (×6): 1000 [IU] via ORAL
  Filled 2016-09-20 (×6): qty 1

## 2016-09-20 MED ORDER — DIAZEPAM 5 MG PO TABS
5.0000 mg | ORAL_TABLET | Freq: Two times a day (BID) | ORAL | Status: DC
  Administered 2016-09-21 – 2016-09-26 (×12): 5 mg via ORAL
  Filled 2016-09-20 (×13): qty 1

## 2016-09-20 MED ORDER — SODIUM CHLORIDE 0.9 % IV SOLN
500.0000 mg | Freq: Two times a day (BID) | INTRAVENOUS | Status: AC
  Administered 2016-09-21 – 2016-09-25 (×10): 500 mg via INTRAVENOUS
  Filled 2016-09-20 (×10): qty 4

## 2016-09-20 MED ORDER — HYDROCODONE-ACETAMINOPHEN 10-325 MG PO TABS
1.0000 | ORAL_TABLET | ORAL | Status: DC | PRN
  Administered 2016-09-21 – 2016-09-26 (×15): 1 via ORAL
  Filled 2016-09-20 (×16): qty 1

## 2016-09-20 MED ORDER — CALCIUM CARBONATE 600 MG PO TABS: 600.0000 mg | ORAL_TABLET | Freq: Two times a day (BID) | ORAL | Status: DC

## 2016-09-20 MED ORDER — VALACYCLOVIR HCL 500 MG PO TABS
500.0000 mg | ORAL_TABLET | Freq: Two times a day (BID) | ORAL | Status: DC
  Administered 2016-09-21: 500 mg via ORAL
  Filled 2016-09-20 (×2): qty 1

## 2016-09-20 MED ORDER — ONDANSETRON HCL 4 MG/2ML IJ SOLN
4.0000 mg | Freq: Once | INTRAMUSCULAR | Status: AC
  Administered 2016-09-20: 4 mg via INTRAVENOUS
  Filled 2016-09-20: qty 2

## 2016-09-20 MED ORDER — ONDANSETRON HCL 4 MG PO TABS
4.0000 mg | ORAL_TABLET | Freq: Four times a day (QID) | ORAL | Status: DC | PRN
Start: 2016-09-20 — End: 2016-09-26

## 2016-09-20 MED ORDER — MORPHINE SULFATE (PF) 4 MG/ML IV SOLN
4.0000 mg | INTRAVENOUS | Status: AC | PRN
  Administered 2016-09-22: 4 mg via INTRAVENOUS
  Filled 2016-09-20 (×2): qty 1

## 2016-09-20 MED ORDER — SODIUM CHLORIDE 0.9 % IV BOLUS (SEPSIS)
1000.0000 mL | Freq: Once | INTRAVENOUS | Status: AC
  Administered 2016-09-20: 1000 mL via INTRAVENOUS

## 2016-09-20 MED ORDER — GADOBENATE DIMEGLUMINE 529 MG/ML IV SOLN
15.0000 mL | Freq: Once | INTRAVENOUS | Status: AC | PRN
  Administered 2016-09-20: 15 mL via INTRAVENOUS

## 2016-09-20 MED ORDER — PREDNISONE 10 MG PO TABS
10.0000 mg | ORAL_TABLET | Freq: Every day | ORAL | Status: DC
Start: 2016-09-21 — End: 2016-09-20

## 2016-09-20 MED ORDER — MORPHINE SULFATE (PF) 4 MG/ML IV SOLN
4.0000 mg | Freq: Once | INTRAVENOUS | Status: AC
  Administered 2016-09-20: 4 mg via INTRAVENOUS
  Filled 2016-09-20: qty 1

## 2016-09-20 MED ORDER — ONDANSETRON HCL 4 MG/2ML IJ SOLN: 4.0000 mg | Freq: Four times a day (QID) | INTRAMUSCULAR | Status: DC | PRN

## 2016-09-20 MED ORDER — ALPRAZOLAM 0.5 MG PO TABS
1.0000 mg | ORAL_TABLET | Freq: Every evening | ORAL | Status: DC | PRN
  Administered 2016-09-21 – 2016-09-25 (×3): 1 mg via ORAL
  Filled 2016-09-20 (×3): qty 2

## 2016-09-20 MED ORDER — DIPHENHYDRAMINE HCL 50 MG/ML IJ SOLN
12.5000 mg | Freq: Once | INTRAMUSCULAR | Status: AC
  Administered 2016-09-20: 12.5 mg via INTRAVENOUS

## 2016-09-20 MED ORDER — DIPHENHYDRAMINE HCL 50 MG/ML IJ SOLN
INTRAMUSCULAR | Status: AC
Start: 2016-09-20 — End: 2016-09-21
  Filled 2016-09-20: qty 1

## 2016-09-20 MED ORDER — SODIUM CHLORIDE 0.9 % IV SOLN
INTRAVENOUS | Status: DC
  Administered 2016-09-21 – 2016-09-24 (×4): via INTRAVENOUS

## 2016-09-20 MED ORDER — GABAPENTIN 100 MG PO CAPS
100.0000 mg | ORAL_CAPSULE | Freq: Three times a day (TID) | ORAL | Status: DC
  Administered 2016-09-21 – 2016-09-26 (×17): 100 mg via ORAL
  Filled 2016-09-20 (×18): qty 1

## 2016-09-20 MED ORDER — CALCIUM CARBONATE 1250 (500 CA) MG PO TABS
1.0000 | ORAL_TABLET | Freq: Every day | ORAL | Status: DC
  Administered 2016-09-21 – 2016-09-26 (×6): 500 mg via ORAL
  Filled 2016-09-20 (×7): qty 1

## 2016-09-20 MED ORDER — DIPHENHYDRAMINE HCL 50 MG/ML IJ SOLN
12.5000 mg | Freq: Once | INTRAMUSCULAR | Status: AC
  Administered 2016-09-20: 12.5 mg via INTRAVENOUS
  Filled 2016-09-20: qty 1

## 2016-09-20 MED ORDER — MORPHINE SULFATE (PF) 4 MG/ML IV SOLN
4.0000 mg | Freq: Once | INTRAVENOUS | Status: AC | PRN
Start: 2016-09-20 — End: 2016-09-20
  Administered 2016-09-20: 4 mg via INTRAVENOUS
  Filled 2016-09-20: qty 1

## 2016-09-20 MED ORDER — ENOXAPARIN SODIUM 40 MG/0.4ML ~~LOC~~ SOLN
40.0000 mg | SUBCUTANEOUS | Status: DC
  Administered 2016-09-21: 40 mg via SUBCUTANEOUS
  Filled 2016-09-20: qty 0.4

## 2016-09-20 MED ORDER — KETOROLAC TROMETHAMINE 15 MG/ML IJ SOLN
15.0000 mg | Freq: Four times a day (QID) | INTRAMUSCULAR | Status: AC | PRN
  Administered 2016-09-23: 15 mg via INTRAVENOUS
  Filled 2016-09-20: qty 1

## 2016-09-20 NOTE — ED Notes (Signed)
Returned from MRI.  Pt peripad was wet with urine, removed and placed dry material.

## 2016-09-20 NOTE — ED Triage Notes (Signed)
Pt sts she has bulging disc and having increased pain and difficulty walking; pt sts N/V and pain meds are making her sick

## 2016-09-20 NOTE — Consult Note (Addendum)
Neurology Consultation Reason for Consult: Lower extremity weakness Referring Physician: Olevia Bowens, D  CC: Lower extremity weakness  History is obtained from: Patient  HPI: Katelyn Lamb is a 71 y.o. female with a history of 2 weeks' worsening back pain and lower extremity weakness/numbness and urinary changes. She states that this is been getting progressive since she noticed some numbness on January 2. He has been getting progressively worse, and she was not able to get up to go to the bathroom on her own last night prompting her to come in for the third time.  Her chief complaint throughout all this is pain, and states that it is "odd, because my legs are numb but they also hurt."  She states that she has had some problems with her legs even in the month December, with occasional numbness but this would come and go was not persistent.  There is no clear precipitating illness.  ROS: A 14 point ROS was performed and is negative except as noted in the HPI.   Past Medical History:  Diagnosis Date  . Anxiety   . Back pain      Family History  Problem Relation Age of Onset  . Hypertension Mother    No history of autoimmune disorders  Social History:  reports that she has quit smoking. She has never used smokeless tobacco. She reports that she does not drink alcohol or use drugs.   Exam: Current vital signs: BP 121/67 (BP Location: Right Arm)   Pulse 93   Temp 97.9 F (36.6 C) (Oral)   Resp 20   Ht '5\' 6"'  (1.676 m)   Wt 65 kg (143 lb 3.2 oz)   SpO2 99%   BMI 23.11 kg/m  Vital signs in last 24 hours: Temp:  [97.9 F (36.6 C)-99 F (37.2 C)] 97.9 F (36.6 C) (01/18 2108) Pulse Rate:  [87-115] 93 (01/18 2108) Resp:  [20-21] 20 (01/18 2108) BP: (98-126)/(67-86) 121/67 (01/18 2108) SpO2:  [93 %-100 %] 99 % (01/18 2108) Weight:  [65 kg (143 lb 3.2 oz)-65.8 kg (145 lb)] 65 kg (143 lb 3.2 oz) (01/18 2108)   Physical Exam  Constitutional: Appears well-developed and  well-nourished.  Psych: Affect appropriate to situation Eyes: No scleral injection HENT: No OP obstrucion Head: Normocephalic.  Cardiovascular: Normal rate and regular rhythm.  Respiratory: Effort normal and breath sounds normal to anterior ascultation GI: Soft.  No distension. There is no tenderness.  Skin: She has 2 lesions of small vesicles, one on her left shoulder and one on her left leg.  Neuro: Mental Status: Patient is awake, alert, oriented to person, place, month, year, and situation. Patient is able to give a clear and coherent history. No signs of aphasia or neglect Cranial Nerves: II: Visual Fields are full. Pupils are equal, round, and reactive to light.   III,IV, VI: EOMI without ptosis or diploplia.  V: Facial sensation is symmetric to temperature VII: Facial movement is symmetric.  VIII: hearing is intact to voice X: Uvula elevates symmetrically XI: Shoulder shrug is symmetric. XII: tongue is midline without atrophy or fasciculations.  Motor: Tone is normal. Bulk is normal. She has good strength in her upper extremities bilaterally, 4/5 strength in bilateral lower extremities throughout with some limitation due to pain. Sensory: Sensation is decreased throughout the lower extremities and in the upper extremities to the elbow. Deep Tendon Reflexes: 3+ and symmetric in the bilateral upper extremities, 2+ in the patella bilaterally. Cerebellar: She has no clear ataxia on  finger nose finger   I have reviewed labs in epic and the results pertinent to this consultation are: ESR-normal BMP-unremarkable  I have reviewed the images obtained: MRI cervical and thoracic spine-patchy areas of signal change with enhancement.  Impression: 71 year old female with subacute progressive myelopathy with enhancing lesions  Most consistent with myelitis. Given her progression, I do think that treatment with Solu-Medrol is indicated at this time.  Differential diagnosis includes  multiple sclerosis, and neuromyelitis optica, infectious myelitis, autoimmune disease. Enhancement I think makes metabolic causes of myelopathy less likely.  Given that her symptoms have been present for several weeks and her rash only appeared today, not certain if these are related.   1) NMO IgG from serum 2) autoimmune evaluation with ANA, serum ACE, SSA, SSB, ena, anca, rf 3) CSF evaluation for cell count and differential, protein, glucose, oligoclonal bands, IgG index.  4) could consider infectious evaluation with CSF PCR for HSV-1, HSV-2, VZV, CMV, EBV  4) HIV, if immune compromised then further infectious workup 5) MRI brain without contrast(cannot get gad too close together). If lesions are seen, could go back for contrasted sequences later.  6) Solumedrol 566m BID 7) I think that VZV myelitis is certainly possible. While awaiting CSF evaluation, IV acyclovir is relatively safe and I would favor starting this for now given that we are also starting immune suppression with steroids.    MRoland Rack MD [facility] [phone] If 7pm- 7am, please page neurology on call as listed in [facility] [facility]

## 2016-09-20 NOTE — ED Provider Notes (Signed)
[facility] DEPT Provider Note   CSN: [id number] Arrival date & time: 09/20/16  1152     History   Chief Complaint Chief Complaint  Patient presents with  . Back Pain    HPI Katelyn Lamb is a 71 y.o. female.  The history is provided by the patient and medical records. No language interpreter was used.  Back Pain   Pertinent negatives include no fever, no headaches, no abdominal pain and no dysuria.   Katelyn Lamb is a 71 y.o. female  who presents to the Emergency Department complaining of gradually worsening low back pain and bilateral lower extremity pain. Patient states that she has a bulging disc in her lower spine seen on MRI performed 6 days ago. Patient followed by orthopedics, Dr. Rolena Infante. She was seen at his office following MRI and told that surgery may be warranted, but she will first need to see GI and neurology. Patient states that her lower back pain has progressively worsened. She also endorses a "pins and needles" sensation from her breast area down to her feet. Patient states that she has been having both bowel and bladder incontinence for the last week now. Due to her pain, she has not felt like eating, therefore number of bowel incontinence episodes have decreased. No blood in the stool. Pain is worse with movement, palpation and ambulation. She has been taking Norco as needed for pain, but notes that this medication does not improve her pain whatsoever.   Past Medical History:  Diagnosis Date  . Anxiety   . Back pain     Patient Active Problem List   Diagnosis Date Noted  . Intractable back pain 09/20/2016    Past Surgical History:  Procedure Laterality Date  . ABDOMINAL HYSTERECTOMY    . BLADDER REPAIR    . CESAREAN SECTION    . TUBAL LIGATION      OB History    No data available       Home Medications    Prior to Admission medications   Medication Sig Start Date End Date Taking? Authorizing Provider  ALPRAZolam Duanne Moron) 1 MG tablet Take 1 mg  by mouth at bedtime as needed for anxiety or sleep.  03/23/14  Yes Historical Provider, MD  calcium carbonate (OS-CAL) 600 MG TABS tablet Take 600 mg by mouth 2 (two) times daily with a meal.   Yes Historical Provider, MD  cholecalciferol (VITAMIN D) 1000 units tablet Take 1,000 Units by mouth daily.   Yes Historical Provider, MD  diazepam (VALIUM) 5 MG tablet Take 1 tablet (5 mg total) by mouth 2 (two) times daily. 09/15/16  Yes Deno Etienne, DO  diphenhydramine-acetaminophen (TYLENOL PM) 25-500 MG TABS tablet Take 1 tablet by mouth at bedtime as needed (sleep/pain).   Yes Historical Provider, MD  gabapentin (NEURONTIN) 100 MG capsule Take 100 mg by mouth 3 (three) times daily. 09/14/16  Yes Historical Provider, MD  HYDROcodone-acetaminophen (NORCO) 10-325 MG per tablet Take 1 tablet by mouth every 8 (eight) hours as needed for moderate pain. 06/24/14  Yes Gay Filler Copland, MD  HYDROmorphone (DILAUDID) 2 MG tablet Take 2 mg by mouth every 8 (eight) hours as needed for pain. 09/14/16  Yes Historical Provider, MD  ketorolac (TORADOL) 10 MG tablet Take 1 tablet (10 mg total) by mouth every 6 (six) hours as needed. 09/09/16  Yes Nat Christen, MD  morphine (MSIR) 30 MG tablet Take 1 tablet (30 mg total) by mouth every 6 (six) hours as needed for  severe pain. 09/09/16  Yes Nat Christen, MD  omega-3 acid ethyl esters (LOVAZA) 1 G capsule Take by mouth 2 (two) times daily.   Yes Historical Provider, MD  predniSONE (DELTASONE) 10 MG tablet Take 10 mg by mouth See admin instructions. Take as directed for 6 days 09/14/16  Yes Historical Provider, MD    Family History History reviewed. No pertinent family history.  Social History Social History  Substance Use Topics  . Smoking status: Former Research scientist (life sciences)  . Smokeless tobacco: Never Used  . Alcohol use No     Allergies   Amoxicillin-pot clavulanate; Demerol [meperidine]; and Penicillins   Review of Systems Review of Systems  Constitutional: Negative for chills and  fever.  HENT: Negative for congestion.   Eyes: Negative for visual disturbance.  Respiratory: Negative for cough and shortness of breath.   Cardiovascular: Negative.   Gastrointestinal: Positive for nausea. Negative for abdominal pain and vomiting.  Genitourinary: Negative for dysuria.       + bowel/bladder incontinence  Musculoskeletal: Positive for back pain and myalgias. Negative for gait problem.  Skin: Negative for rash.  Neurological: Negative for headaches.     Physical Exam Updated Vital Signs BP 104/74   Pulse 94   Temp 99 F (37.2 C) (Oral)   Resp 20   Ht 5\' 6"  (1.676 m)   Wt 65.8 kg   SpO2 93%   BMI 23.40 kg/m   Physical Exam  Constitutional: She is oriented to person, place, and time. She appears well-developed and well-nourished. No distress.  HENT:  Head: Normocephalic and atraumatic.  Cardiovascular: Normal rate, regular rhythm and normal heart sounds.   No murmur heard. Pulmonary/Chest: Effort normal and breath sounds normal. No respiratory distress. She has no wheezes. She has no rales.  Abdominal: Soft. She exhibits no distension. There is no tenderness.  Genitourinary:  Genitourinary Comments: Good rectal tone. Sensation to buttocks area at S1, S2 and S3 intact bilaterally.   Musculoskeletal:  5/5 muscle strength of all four extremities. Straight leg raises with pain bilaterally. Bilateral lower extremities with sensation intact, good distal pulses, good cap refill in all compartments soft. No cervical or thoracic midline/paraspinal tenderness. TTP of L-spine as well as paraspinal and SI areas.   Neurological: She is alert and oriented to person, place, and time.  Skin: Skin is warm and dry.  Nursing note and vitals reviewed.    ED Treatments / Results  Labs (all labs ordered are listed, but only abnormal results are displayed) Labs Reviewed  BASIC METABOLIC PANEL - Abnormal; Notable for the following:       Result Value   Sodium 134 (*)     Chloride 96 (*)    BUN 24 (*)    GFR calc non Af Amer 57 (*)    All other components within normal limits  CBC WITH DIFFERENTIAL/PLATELET - Abnormal; Notable for the following:    WBC 10.9 (*)    Hemoglobin 15.8 (*)    HCT 47.4 (*)    All other components within normal limits  CK  SEDIMENTATION RATE    EKG  EKG Interpretation None       Radiology No results found.  Procedures Procedures (including critical care time)  Medications Ordered in ED Medications  gadobenate dimeglumine (MULTIHANCE) injection 15 mL (not administered)  sodium chloride 0.9 % bolus 1,000 mL (0 mLs Intravenous Stopped 09/20/16 1519)  ondansetron (ZOFRAN) injection 4 mg (4 mg Intravenous Given 09/20/16 1347)  morphine 4 MG/ML injection 4  mg (4 mg Intravenous Given 09/20/16 1347)  morphine 4 MG/ML injection 4 mg (4 mg Intravenous Given 09/20/16 1526)  diphenhydrAMINE (BENADRYL) injection 12.5 mg (12.5 mg Intravenous Given 09/20/16 1530)     Initial Impression / Assessment and Plan / ED Course  I have reviewed the triage vital signs and the nursing notes.  Pertinent labs & imaging results that were available during my care of the patient were reviewed by me and considered in my medical decision making (see chart for details).     Katelyn Lamb is a 71 y.o. female who presents to ED for Worsening low back pain and bilateral lower extremity pain described as a pins and needle sensation. Numbness from breast down to toes bilaterally. Patient is followed by Dr. Rolena Infante of orthopedics for this. She has a recent MRI last week and has followed up with him following imaging. Patient endorses both bowel and bladder incontinence for the last 4-5 days.  Attending, Dr. Jeneen Rinks spoke with Dr. Rolena Infante who would like repeat imaging and will evaluate patient.   5:30 PM - Spoke with radiology concerning MRI findings. Per radiology, patient has abnormal signal to cervical and thoracic spine which is concerning and would like  MRI with GAD for further evaluation.   6:00 PM - Spoke with Dr. Rolena Infante who recommends neurology consult and medical admission.   6:12 PM - Spoke with neurology, Dr. Lawana Pai who agrees with repeat MR with contrast and will see patient in consultation.   Hospitalist consulted who will admit.   Patient seen by and discussed with Dr. Jeneen Rinks who agrees with treatment plan.    Final Clinical Impressions(s) / ED Diagnoses   Final diagnoses:  Back pain  Numbness  Intractable back pain    New Prescriptions New Prescriptions   No medications on file     [facility] Tarisa [name], PA-C 09/20/16 1844    Tanna Furry, MD 09/21/16 [phone]

## 2016-09-20 NOTE — ED Notes (Signed)
Pt reports itching with morphine, tolerable if benadryl is given. Medication provided per orders

## 2016-09-20 NOTE — H&P (Signed)
History and Physical    Katelyn Lamb [id number] DOB: [birth year] DOA: 09/20/2016  PCP: No PCP Per Patient   Patient coming from: Home.  Chief Complaint: Back pain  HPI: Katelyn Lamb is a 71 y.o. female with medical history significant of anxiety and chronic back pain who is coming to the emergency department with progressively worse lower back pain and numbness that radiates to her lower extremities since 09/04/2016 while she was in [place]. After returning home, symptoms have continued to progress to the point that she is unable to ambulate without assistance and has had sphincteric incontinence over the past few days. She denies fever,  but feels fatigued. She denies headache, sore throat, productive cough, chest pain, dizziness, palpitations, pitting edema lower extremities, abdominal pain, nausea, emesis, diarrhea or constipation. She denies dysuria or frequency.  ED Course: The patient received IV morphine and Benadryl reporting relief. WBC is 10.9, hemoglobin 15.8 g/dL and platelets 270. Sodium 139, potassium 3.9, chloride 96 and CO2 23 mmol/L. Her BUN was 24 mg/dL. MRI of the spine shows multiple abnormalities at all levels. Please see reports for further detail.  Review of Systems: As per HPI otherwise 10 point review of systems negative.    Past Medical History:  Diagnosis Date  . Anxiety   . Back pain     Past Surgical History:  Procedure Laterality Date  . ABDOMINAL HYSTERECTOMY    . BLADDER REPAIR    . CESAREAN SECTION    . TUBAL LIGATION       reports that she has quit smoking. She has never used smokeless tobacco. She reports that she does not drink alcohol or use drugs.  Allergies  Allergen Reactions  . Amoxicillin-Pot Clavulanate Other (See Comments)  . Demerol [Meperidine]     Hallucinations  . Penicillins     Family History  Problem Relation Age of Onset  . Hypertension Mother     Prior to Admission medications   Medication Sig Start Date End Date  Taking? Authorizing Provider  ALPRAZolam Duanne Moron) 1 MG tablet Take 1 mg by mouth at bedtime as needed for anxiety or sleep.  03/23/14  Yes Historical Provider, MD  calcium carbonate (OS-CAL) 600 MG TABS tablet Take 600 mg by mouth 2 (two) times daily with a meal.   Yes Historical Provider, MD  cholecalciferol (VITAMIN D) 1000 units tablet Take 1,000 Units by mouth daily.   Yes Historical Provider, MD  diazepam (VALIUM) 5 MG tablet Take 1 tablet (5 mg total) by mouth 2 (two) times daily. 09/15/16  Yes Deno Etienne, DO  diphenhydramine-acetaminophen (TYLENOL PM) 25-500 MG TABS tablet Take 1 tablet by mouth at bedtime as needed (sleep/pain).   Yes Historical Provider, MD  gabapentin (NEURONTIN) 100 MG capsule Take 100 mg by mouth 3 (three) times daily. 09/14/16  Yes Historical Provider, MD  HYDROcodone-acetaminophen (NORCO) 10-325 MG per tablet Take 1 tablet by mouth every 8 (eight) hours as needed for moderate pain. 06/24/14  Yes Gay Filler Copland, MD  HYDROmorphone (DILAUDID) 2 MG tablet Take 2 mg by mouth every 8 (eight) hours as needed for pain. 09/14/16  Yes Historical Provider, MD  ketorolac (TORADOL) 10 MG tablet Take 1 tablet (10 mg total) by mouth every 6 (six) hours as needed. 09/09/16  Yes Nat Christen, MD  morphine (MSIR) 30 MG tablet Take 1 tablet (30 mg total) by mouth every 6 (six) hours as needed for severe pain. 09/09/16  Yes Nat Christen, MD  omega-3 acid ethyl esters (  LOVAZA) 1 G capsule Take by mouth 2 (two) times daily.   Yes Historical Provider, MD  predniSONE (DELTASONE) 10 MG tablet Take 10 mg by mouth See admin instructions. Take as directed for 6 days 09/14/16  Yes Historical Provider, MD    Physical Exam:  Constitutional: NAD, calm, comfortable Vitals:   09/20/16 1254 09/20/16 1500 09/20/16 1715 09/20/16 2108  BP: 118/82 126/86 104/74 121/67  Pulse: 98 87 94 93  Resp: 20 20  20   Temp:    97.9 F (36.6 C)  TempSrc:    Oral  SpO2: 100% 98% 93% 99%  Weight:    65 kg (143 lb 3.2 oz)    Height:    5\' 6"  (1.676 m)   Eyes: PERRL, lids and conjunctivae normal ENMT: Mucous membranes are moist. Posterior pharynx clear of any exudate or lesions.  Neck: normal, supple, no masses, no thyromegaly Respiratory: clear to auscultation bilaterally, no wheezing, no crackles. Normal respiratory effort. No accessory muscle use.  Cardiovascular: Regular rate and rhythm, no murmurs / rubs / gallops. No extremity edema. 2+ pedal pulses. No carotid bruits.  Abdomen: no tenderness, no masses palpated. No hepatosplenomegaly. Bowel sounds positive.  Musculoskeletal: no clubbing / cyanosis.Good ROM, no contractures. Normal muscle tone.  Skin: left lateral chest wall and knee area vesicular rash Neurologic: CN 2-12 grossly intact. Sensation intact, DTR normal. Decreased strength on lower extremities. Psychiatric:  Alert and oriented x 3. Midlly anxiousmood.        Labs on Admission: I have personally reviewed following labs and imaging studies  CBC:  Recent Labs Lab 09/20/16 1420  WBC 10.9*  NEUTROABS 7.4  HGB 15.8*  HCT 47.4*  MCV 95.0  PLT [id number]   Basic Metabolic Panel:  Recent Labs Lab 09/20/16 1420  NA 134*  K 3.9  CL 96*  CO2 23  GLUCOSE 70  BUN 24*  CREATININE 1.00  CALCIUM 10.0   GFR: Estimated Creatinine Clearance: 50.4 mL/min (by C-G formula based on SCr of 1 mg/dL). Liver Function Tests: No results for input(s): AST, ALT, ALKPHOS, BILITOT, PROT, ALBUMIN in the last 168 hours. No results for input(s): LIPASE, AMYLASE in the last 168 hours. No results for input(s): AMMONIA in the last 168 hours. Coagulation Profile: No results for input(s): INR, PROTIME in the last 168 hours. Cardiac Enzymes:  Recent Labs Lab 09/20/16 1420  CKTOTAL 200   BNP (last 3 results) No results for input(s): PROBNP in the last 8760 hours. HbA1C: No results for input(s): HGBA1C in the last 72 hours. CBG: No results for input(s): GLUCAP in the last 168 hours. Lipid Profile: No  results for input(s): CHOL, HDL, LDLCALC, TRIG, CHOLHDL, LDLDIRECT in the last 72 hours. Thyroid Function Tests: No results for input(s): TSH, T4TOTAL, FREET4, T3FREE, THYROIDAB in the last 72 hours. Anemia Panel: No results for input(s): VITAMINB12, FOLATE, FERRITIN, TIBC, IRON, RETICCTPCT in the last 72 hours. Urine analysis: No results found for: COLORURINE, APPEARANCEUR, LABSPEC, PHURINE, GLUCOSEU, HGBUR, BILIRUBINUR, KETONESUR, PROTEINUR, UROBILINOGEN, NITRITE, LEUKOCYTESUR  Radiological Exams on Admission: Mr Lumbar Spine Wo Contrast  Result Date: 09/20/2016 CLINICAL DATA:  Acute on chronic low back pain radiating to buttocks and LEFT lower extremity with numbness and tingling. On Vicodin with minimal relief. EXAM: MRI CERVICAL, THORACIC SPINE WITH AND WITHOUT CONTRAST MR LUMBAR SPINE WITHOUT TECHNIQUE: Multiplanar and multiecho pulse sequences of the cervical spine, to include the craniocervical junction and cervicothoracic junction, and thoracic with and without contrast. Multiplanar and multi echo pulse sequences of  the lumbar spine, were obtained without intravenous contrast. CONTRAST:  15 cc MultiHance COMPARISON:  CT chest May 19, 2014 and MRI of lumbar spine February 26, 2008 FINDINGS: MRI CERVICAL SPINE FINDINGS ALIGNMENT: Straightened cervical lordosis.  No malalignment. VERTEBRAE/DISCS: Vertebral bodies are intact. Moderate to severe C3-4 thru C6-7 disc height loss of with moderate chronic discogenic endplate changes and disc desiccation. No suspicious bone marrow signal. No suspicious osseous or intradiscal enhancement. CORD:Expansile T2 bright signal and patchy varying enhancement predominately involving the posterior columns C2 through C4-5 and more central within the spinal cord at C5 through C7-T1. No myelomalacia. No syrinx. No abnormal leptomeningeal or epidural enhancement. POSTERIOR FOSSA, VERTEBRAL ARTERIES, PARASPINAL TISSUES: No MR findings of ligamentous injury. Vertebral  artery flow voids present. Included posterior fossa and paraspinal soft tissues are normal. DISC LEVELS: C2-3: Small central disc protrusion. Mild facet arthropathy without canal stenosis or neural foraminal narrowing. C3-4: 2 mm broad-based disc bulge, uncovertebral hypertrophy and mild facet arthropathy. Mild canal stenosis. Moderate RIGHT, mild to moderate LEFT neural foraminal narrowing. C4-5: Annular bulging, uncovertebral hypertrophy and mild facet arthropathy. Mild canal stenosis. Moderate RIGHT and moderate to severe LEFT neural foraminal narrowing. C5-6: Broad-based disc bulge and LEFT central disc protrusion in total measure 3 mm in AP dimension. Moderate to severe canal stenosis, AP dimension the canal is 6 mm. Ventral cord deformity. Severe RIGHT and moderate to severe LEFT neural foraminal narrowing. C6-7: 2 mm broad-based disc bulge, uncovertebral hypertrophy and mild facet arthropathy. Moderate canal stenosis. Moderate to severe bilateral neural foraminal narrowing. C7-T1: No disc bulge, canal stenosis nor neural foraminal narrowing. MRI THORACIC SPINE FINDINGS ALIGNMENT: Maintenance of the thoracic kyphosis. No malalignment. VERTEBRAE/DISCS: Vertebral bodies are intact. Old minimally displaced sternal fracture. Intervertebral discs morphology and signal are normal. Multilevel mild chronic discogenic endplate changes/spurring. Moderate subacute on chronic discogenic endplate changes [id number]. Heterogeneous T1, bright STIR probable hemangioma T2, 10 mm. Additional large L1 hemangioma. No suspicious osseous or intradiscal enhancement. CORD: Patchy mildly expansile central spinal cord T2 hyperintense signal and varying enhancement T1-2 through T2, T5-6 through T7-8 with predominant dorsal cord involvement. Patchy T2 hyperintense signal within the conus medullaris. No syrinx. No abnormal leptomeningeal or epidural enhancement. No myelomalacia. PREVERTEBRAL AND PARASPINAL SOFT TISSUES:  Normal. DISC LEVELS:  At T5-6 is 3 mm RIGHT central disc protrusion/extrusion without migration, contacting the ventral spinal cord without canal stenosis or neural foraminal narrowing at any level. MRI LUMBAR SPINE FINDINGS SEGMENTATION: For the purposes of this report, the last well-formed intervertebral disc will be described as L5-S1. ALIGNMENT: Maintenance of the lumbar lordosis. Grade 1 L4-5 anterolisthesis is similar, no spondylolysis. VERTEBRAE:Vertebral bodies are intact. Mild L4-5 and L5-S1 disc height loss is similar with decreased T2 signal within the lower lumbar disc compatible with mild desiccation. Mild chronic discogenic endplate changes 075-GRM and L5-S1. No abnormal bone marrow signal. No suspicious bone marrow signal. Large L1 hemangioma with additional scattered small hemangiomas. CONUS MEDULLARIS: Conus medullaris terminates at L1-2. Cauda equina is normal. PARASPINAL AND SOFT TISSUES: Extremely distended urinary bladder. DISC LEVELS: L1-2: Small broad-based LEFT central disc protrusion, interval re- absorption of extrusion. No canal stenosis or neural foraminal narrowing. L2-3: No disc bulge, canal stenosis nor neural foraminal narrowing. L3-4: Annular bulging. Moderate facet arthropathy and ligamentum flavum redundancy with trace facet effusions which are likely reactive. No canal stenosis. Mild neural foraminal narrowing. L4-5: Anterolisthesis. Small broad-based disc bulge. Severe facet arthropathy and ligamentum flavum redundancy with trace facet effusions which are likely reactive. Mild  canal stenosis including partial effacement lateral recesses which may affect the traversing L5 nerves. Moderate to severe RIGHT, moderate LEFT neural foraminal narrowing. L5-S1: Small broad-based disc bulge. Moderate to severe RIGHT, moderate LEFT facet arthropathy. Trace RIGHT facet effusion is likely reactive. No canal stenosis. Mild LEFT neural foraminal narrowing. IMPRESSION: MRI CERVICAL SPINE: Abnormal spinal cord signal and  enhancement without discrete mass. 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 Recommend correlation with cerebral spinal fluid studies. Degenerative cervical spine results in moderate to severe canal stenosis C5-6, moderate at C6-7. Neural foraminal narrowing C3-4 thru C6-7: Severe on the RIGHT at C5-6, moderate to severe multiple levels. MRI THORACIC SPINE: Abnormal spinal cord signal and enhancement without discrete mass. 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 Recommend correlation with cerebral spinal fluid studies. Small T5-6 disc protrusion/extrusion without canal stenosis or neural foraminal narrowing. MRI LUMBAR SPINE: Re- absorption of L1-2 disc extrusion with small residual protrusion. Degenerative lumbar spine resulting in mild canal stenosis L4-5, similar grade 1 L4-5 anterolisthesis on degenerative basis. Neural foraminal narrowing L3-4 through L5-S1: Moderate to severe on the RIGHT at L4-5. Preliminary acute findings discussed with and reconfirmed by PA.JAIME WARD on 09/20/2016 at 5:35 pm. Electronically Signed   By: Elon Alas M.D.   On: 09/20/2016 19:19   Mr Cervical Spine W Wo Contrast  Result Date: 09/20/2016 CLINICAL DATA:  Acute on chronic low back pain radiating to buttocks and LEFT lower extremity with numbness and tingling. On Vicodin with minimal relief. EXAM: MRI CERVICAL, THORACIC SPINE WITH AND WITHOUT CONTRAST MR LUMBAR SPINE WITHOUT TECHNIQUE: Multiplanar and multiecho pulse sequences of the cervical spine, to include the craniocervical junction and cervicothoracic junction, and thoracic with and without contrast. Multiplanar and multi echo pulse sequences of the lumbar spine, were obtained without intravenous contrast. CONTRAST:  15 cc MultiHance COMPARISON:  CT chest May 19, 2014 and MRI of lumbar spine February 26, 2008 FINDINGS: MRI CERVICAL SPINE FINDINGS ALIGNMENT: Straightened cervical lordosis.  No malalignment. VERTEBRAE/DISCS: Vertebral  bodies are intact. Moderate to severe C3-4 thru C6-7 disc height loss of with moderate chronic discogenic endplate changes and disc desiccation. No suspicious bone marrow signal. No suspicious osseous or intradiscal enhancement. CORD:Expansile T2 bright signal and patchy varying enhancement predominately involving the posterior columns C2 through C4-5 and more central within the spinal cord at C5 through C7-T1. No myelomalacia. No syrinx. No abnormal leptomeningeal or epidural enhancement. POSTERIOR FOSSA, VERTEBRAL ARTERIES, PARASPINAL TISSUES: No MR findings of ligamentous injury. Vertebral artery flow voids present. Included posterior fossa and paraspinal soft tissues are normal. DISC LEVELS: C2-3: Small central disc protrusion. Mild facet arthropathy without canal stenosis or neural foraminal narrowing. C3-4: 2 mm broad-based disc bulge, uncovertebral hypertrophy and mild facet arthropathy. Mild canal stenosis. Moderate RIGHT, mild to moderate LEFT neural foraminal narrowing. C4-5: Annular bulging, uncovertebral hypertrophy and mild facet arthropathy. Mild canal stenosis. Moderate RIGHT and moderate to severe LEFT neural foraminal narrowing. C5-6: Broad-based disc bulge and LEFT central disc protrusion in total measure 3 mm in AP dimension. Moderate to severe canal stenosis, AP dimension the canal is 6 mm. Ventral cord deformity. Severe RIGHT and moderate to severe LEFT neural foraminal narrowing. C6-7: 2 mm broad-based disc bulge, uncovertebral hypertrophy and mild facet arthropathy. Moderate canal stenosis. Moderate to severe bilateral neural foraminal narrowing. C7-T1: No disc bulge, canal stenosis nor neural foraminal narrowing. MRI THORACIC SPINE FINDINGS ALIGNMENT: Maintenance of the thoracic kyphosis. No malalignment. VERTEBRAE/DISCS: Vertebral bodies are intact. Old minimally displaced sternal fracture. Intervertebral  discs morphology and signal are normal. Multilevel mild chronic discogenic endplate  changes/spurring. Moderate subacute on chronic discogenic endplate changes [id number]. Heterogeneous T1, bright STIR probable hemangioma T2, 10 mm. Additional large L1 hemangioma. No suspicious osseous or intradiscal enhancement. CORD: Patchy mildly expansile central spinal cord T2 hyperintense signal and varying enhancement T1-2 through T2, T5-6 through T7-8 with predominant dorsal cord involvement. Patchy T2 hyperintense signal within the conus medullaris. No syrinx. No abnormal leptomeningeal or epidural enhancement. No myelomalacia. PREVERTEBRAL AND PARASPINAL SOFT TISSUES:  Normal. DISC LEVELS: At T5-6 is 3 mm RIGHT central disc protrusion/extrusion without migration, contacting the ventral spinal cord without canal stenosis or neural foraminal narrowing at any level. MRI LUMBAR SPINE FINDINGS SEGMENTATION: For the purposes of this report, the last well-formed intervertebral disc will be described as L5-S1. ALIGNMENT: Maintenance of the lumbar lordosis. Grade 1 L4-5 anterolisthesis is similar, no spondylolysis. VERTEBRAE:Vertebral bodies are intact. Mild L4-5 and L5-S1 disc height loss is similar with decreased T2 signal within the lower lumbar disc compatible with mild desiccation. Mild chronic discogenic endplate changes 075-GRM and L5-S1. No abnormal bone marrow signal. No suspicious bone marrow signal. Large L1 hemangioma with additional scattered small hemangiomas. CONUS MEDULLARIS: Conus medullaris terminates at L1-2. Cauda equina is normal. PARASPINAL AND SOFT TISSUES: Extremely distended urinary bladder. DISC LEVELS: L1-2: Small broad-based LEFT central disc protrusion, interval re- absorption of extrusion. No canal stenosis or neural foraminal narrowing. L2-3: No disc bulge, canal stenosis nor neural foraminal narrowing. L3-4: Annular bulging. Moderate facet arthropathy and ligamentum flavum redundancy with trace facet effusions which are likely reactive. No canal stenosis. Mild neural foraminal narrowing.  L4-5: Anterolisthesis. Small broad-based disc bulge. Severe facet arthropathy and ligamentum flavum redundancy with trace facet effusions which are likely reactive. Mild canal stenosis including partial effacement lateral recesses which may affect the traversing L5 nerves. Moderate to severe RIGHT, moderate LEFT neural foraminal narrowing. L5-S1: Small broad-based disc bulge. Moderate to severe RIGHT, moderate LEFT facet arthropathy. Trace RIGHT facet effusion is likely reactive. No canal stenosis. Mild LEFT neural foraminal narrowing. IMPRESSION: MRI CERVICAL SPINE: Abnormal spinal cord signal and enhancement without discrete mass. 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 Recommend correlation with cerebral spinal fluid studies. Degenerative cervical spine results in moderate to severe canal stenosis C5-6, moderate at C6-7. Neural foraminal narrowing C3-4 thru C6-7: Severe on the RIGHT at C5-6, moderate to severe multiple levels. MRI THORACIC SPINE: Abnormal spinal cord signal and enhancement without discrete mass. 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 Recommend correlation with cerebral spinal fluid studies. Small T5-6 disc protrusion/extrusion without canal stenosis or neural foraminal narrowing. MRI LUMBAR SPINE: Re- absorption of L1-2 disc extrusion with small residual protrusion. Degenerative lumbar spine resulting in mild canal stenosis L4-5, similar grade 1 L4-5 anterolisthesis on degenerative basis. Neural foraminal narrowing L3-4 through L5-S1: Moderate to severe on the RIGHT at L4-5. Preliminary acute findings discussed with and reconfirmed by PA.JAIME WARD on 09/20/2016 at 5:35 pm. Electronically Signed   By: Elon Alas M.D.   On: 09/20/2016 19:19   Mr Thoracic Spine W Wo Contrast  Result Date: 09/20/2016 CLINICAL DATA:  Acute on chronic low back pain radiating to buttocks and LEFT lower extremity with numbness and tingling. On Vicodin with minimal relief. EXAM:  MRI CERVICAL, THORACIC SPINE WITH AND WITHOUT CONTRAST MR LUMBAR SPINE WITHOUT TECHNIQUE: Multiplanar and multiecho pulse sequences of the cervical spine, to include the craniocervical junction and cervicothoracic junction, and thoracic with and without contrast. Multiplanar and multi echo  pulse sequences of the lumbar spine, were obtained without intravenous contrast. CONTRAST:  15 cc MultiHance COMPARISON:  CT chest May 19, 2014 and MRI of lumbar spine February 26, 2008 FINDINGS: MRI CERVICAL SPINE FINDINGS ALIGNMENT: Straightened cervical lordosis.  No malalignment. VERTEBRAE/DISCS: Vertebral bodies are intact. Moderate to severe C3-4 thru C6-7 disc height loss of with moderate chronic discogenic endplate changes and disc desiccation. No suspicious bone marrow signal. No suspicious osseous or intradiscal enhancement. CORD:Expansile T2 bright signal and patchy varying enhancement predominately involving the posterior columns C2 through C4-5 and more central within the spinal cord at C5 through C7-T1. No myelomalacia. No syrinx. No abnormal leptomeningeal or epidural enhancement. POSTERIOR FOSSA, VERTEBRAL ARTERIES, PARASPINAL TISSUES: No MR findings of ligamentous injury. Vertebral artery flow voids present. Included posterior fossa and paraspinal soft tissues are normal. DISC LEVELS: C2-3: Small central disc protrusion. Mild facet arthropathy without canal stenosis or neural foraminal narrowing. C3-4: 2 mm broad-based disc bulge, uncovertebral hypertrophy and mild facet arthropathy. Mild canal stenosis. Moderate RIGHT, mild to moderate LEFT neural foraminal narrowing. C4-5: Annular bulging, uncovertebral hypertrophy and mild facet arthropathy. Mild canal stenosis. Moderate RIGHT and moderate to severe LEFT neural foraminal narrowing. C5-6: Broad-based disc bulge and LEFT central disc protrusion in total measure 3 mm in AP dimension. Moderate to severe canal stenosis, AP dimension the canal is 6 mm. Ventral cord  deformity. Severe RIGHT and moderate to severe LEFT neural foraminal narrowing. C6-7: 2 mm broad-based disc bulge, uncovertebral hypertrophy and mild facet arthropathy. Moderate canal stenosis. Moderate to severe bilateral neural foraminal narrowing. C7-T1: No disc bulge, canal stenosis nor neural foraminal narrowing. MRI THORACIC SPINE FINDINGS ALIGNMENT: Maintenance of the thoracic kyphosis. No malalignment. VERTEBRAE/DISCS: Vertebral bodies are intact. Old minimally displaced sternal fracture. Intervertebral discs morphology and signal are normal. Multilevel mild chronic discogenic endplate changes/spurring. Moderate subacute on chronic discogenic endplate changes [id number]. Heterogeneous T1, bright STIR probable hemangioma T2, 10 mm. Additional large L1 hemangioma. No suspicious osseous or intradiscal enhancement. CORD: Patchy mildly expansile central spinal cord T2 hyperintense signal and varying enhancement T1-2 through T2, T5-6 through T7-8 with predominant dorsal cord involvement. Patchy T2 hyperintense signal within the conus medullaris. No syrinx. No abnormal leptomeningeal or epidural enhancement. No myelomalacia. PREVERTEBRAL AND PARASPINAL SOFT TISSUES:  Normal. DISC LEVELS: At T5-6 is 3 mm RIGHT central disc protrusion/extrusion without migration, contacting the ventral spinal cord without canal stenosis or neural foraminal narrowing at any level. MRI LUMBAR SPINE FINDINGS SEGMENTATION: For the purposes of this report, the last well-formed intervertebral disc will be described as L5-S1. ALIGNMENT: Maintenance of the lumbar lordosis. Grade 1 L4-5 anterolisthesis is similar, no spondylolysis. VERTEBRAE:Vertebral bodies are intact. Mild L4-5 and L5-S1 disc height loss is similar with decreased T2 signal within the lower lumbar disc compatible with mild desiccation. Mild chronic discogenic endplate changes 075-GRM and L5-S1. No abnormal bone marrow signal. No suspicious bone marrow signal. Large L1 hemangioma  with additional scattered small hemangiomas. CONUS MEDULLARIS: Conus medullaris terminates at L1-2. Cauda equina is normal. PARASPINAL AND SOFT TISSUES: Extremely distended urinary bladder. DISC LEVELS: L1-2: Small broad-based LEFT central disc protrusion, interval re- absorption of extrusion. No canal stenosis or neural foraminal narrowing. L2-3: No disc bulge, canal stenosis nor neural foraminal narrowing. L3-4: Annular bulging. Moderate facet arthropathy and ligamentum flavum redundancy with trace facet effusions which are likely reactive. No canal stenosis. Mild neural foraminal narrowing. L4-5: Anterolisthesis. Small broad-based disc bulge. Severe facet arthropathy and ligamentum flavum redundancy with trace facet effusions which are  likely reactive. Mild canal stenosis including partial effacement lateral recesses which may affect the traversing L5 nerves. Moderate to severe RIGHT, moderate LEFT neural foraminal narrowing. L5-S1: Small broad-based disc bulge. Moderate to severe RIGHT, moderate LEFT facet arthropathy. Trace RIGHT facet effusion is likely reactive. No canal stenosis. Mild LEFT neural foraminal narrowing. IMPRESSION: MRI CERVICAL SPINE: Abnormal spinal cord signal and enhancement without discrete mass. 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 Recommend correlation with cerebral spinal fluid studies. Degenerative cervical spine results in moderate to severe canal stenosis C5-6, moderate at C6-7. Neural foraminal narrowing C3-4 thru C6-7: Severe on the RIGHT at C5-6, moderate to severe multiple levels. MRI THORACIC SPINE: Abnormal spinal cord signal and enhancement without discrete mass. 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 Recommend correlation with cerebral spinal fluid studies. Small T5-6 disc protrusion/extrusion without canal stenosis or neural foraminal narrowing. MRI LUMBAR SPINE: Re- absorption of L1-2 disc extrusion with small residual protrusion.  Degenerative lumbar spine resulting in mild canal stenosis L4-5, similar grade 1 L4-5 anterolisthesis on degenerative basis. Neural foraminal narrowing L3-4 through L5-S1: Moderate to severe on the RIGHT at L4-5. Preliminary acute findings discussed with and reconfirmed by PA.JAIME WARD on 09/20/2016 at 5:35 pm. Electronically Signed   By: Elon Alas M.D.   On: 09/20/2016 19:19     Assessment/Plan Principal Problem:   Numbness of left lower extremity   Abnormal MRI of the cervical and thoracic spine consistent with myelitis. Admit to MedSurg/observation. Analgesics as needed for pain. Further workup and treatment per neurology.  Active Problems:   Intractable back pain Continue analgesics as needed.    Hyponatremia Normal saline at 75 MLs an hour. Follow-up sodium level in a.m.    Herpes zoster Per patient, lesions are painful and pruritic. Will start valacyclovir and begin contact precautions.     DVT prophylaxis: Lovenox. Code Status: Full code. Family Communication: Her husband is present in the room. Disposition Plan: Admit for further evaluation and treatment. Consults called: Orthopedic surgery (Dr. Rolena Infante), Dr. Leonel Ramsay (neurology) Admission status: Observation/MedSurg   Reubin Milan MD [facility] Pager [phone].  If 7PM-7AM, please contact night-coverage [email or web address] Password [facility]  09/20/2016, 9:21 PM

## 2016-09-20 NOTE — ED Notes (Signed)
Admitting MD at bedside; requesting pain medications be given prior to transport upstairs

## 2016-09-20 NOTE — ED Notes (Signed)
Pt assisted to bedside commode

## 2016-09-20 NOTE — Consult Note (Signed)
No PCP Per Patient Chief Complaint: Sever back and leg pain History: Status post chronic low back pain with radicular symptoms. She sees Dr. Nelva Bush locally for epidural injections. She now complains of pain in the left lower back c radiation to the buttocks and to the left lower leg. Her leg feels numb and tingling. No frank bowel or bladder incontinence. She has difficulty ambulating. No foot drop. Severity of pain is moderate. She has been taking Vicodin at home with minimal relief.  Past Medical History:  Diagnosis Date  . Anxiety   . Back pain     Allergies  Allergen Reactions  . Amoxicillin-Pot Clavulanate Other (See Comments)  . Demerol [Meperidine]     Hallucinations  . Penicillins     No current facility-administered medications on file prior to encounter.    Current Outpatient Prescriptions on File Prior to Encounter  Medication Sig Dispense Refill  . ALPRAZolam (XANAX) 1 MG tablet Take 1 mg by mouth at bedtime as needed for anxiety or sleep.     . calcium carbonate (OS-CAL) 600 MG TABS tablet Take 600 mg by mouth 2 (two) times daily with a meal.    . diazepam (VALIUM) 5 MG tablet Take 1 tablet (5 mg total) by mouth 2 (two) times daily. 10 tablet 0  . HYDROcodone-acetaminophen (NORCO) 10-325 MG per tablet Take 1 tablet by mouth every 8 (eight) hours as needed for moderate pain. 30 tablet 0  . ketorolac (TORADOL) 10 MG tablet Take 1 tablet (10 mg total) by mouth every 6 (six) hours as needed. 20 tablet 0  . morphine (MSIR) 30 MG tablet Take 1 tablet (30 mg total) by mouth every 6 (six) hours as needed for severe pain. 20 tablet 0  . omega-3 acid ethyl esters (LOVAZA) 1 G capsule Take by mouth 2 (two) times daily.      Physical Exam: Vitals:   09/20/16 1254 09/20/16 1500  BP: 118/82 126/86  Pulse: 98 87  Resp: 20 20  Temp:    A+O X3 No SOB/CP Abd soft/NT.  Denies further episodes of incontinence of bowel/bladder No hip/knee/ankle  Shoulder/elbow/wrist pain with  PROM Global weakness and pain with ROM and strength testing Unable to stand and ambulate due to pain 1+ UE/LE reflexes Unequivocal babinski/hoffman  Negative clonus Compartments soft/NT 1+ DP/PT pulses   Image: Dg Lumbar Spine Complete  Result Date: 09/09/2016 CLINICAL DATA:  Low back pain with left leg pain EXAM: LUMBAR SPINE - COMPLETE 4+ VIEW COMPARISON:  Lumbar MRI 02/26/2008 FINDINGS: 5 mm anterior slip L4-L5 has progressed in the interval. Remaining alignment normal. Negative for fracture or pars defect. Facet degeneration at L4-5. Disc spaces intact. IMPRESSION: Grade 1 anterolisthesis L4-5 has progressed since 2009. No acute abnormality Electronically Signed   By: Franchot Gallo M.D.   On: 09/09/2016 18:59    A/P: Patient was evaluated by me in the office on Monday 09/17/16 with progressive LE pain, incontinence of bowel and bladder (x3 weeks) and progressive difficulty ambulating (balance). At that time lumbar MRi demonstrate low grade slip at L4/5 but no significant stenosis or neural compression. Given extent of pathology and pain I felt further work-up was needed.  Recommended outpatient neurology evaluation which was suppose to happen today.  However, the office was closed and appointment cancelled.  Because of her worsening pain and poor po intake her husband brought her to the ER fir further evaluation. Her cervical and thoracic MRI demonstrate multiple areas of cord signal change without significant  compression.  Question of MS or transverse myelitis. No obvious surgical lesion appreciated at this time.  Awaiting final MRI read. At this point I recommend medicine admission for pain management and neurology consult for further diagnostic work-up I will continue to monitor clinical exam Please contact me if any questions arise  [phone]

## 2016-09-20 NOTE — ED Notes (Signed)
Pt back to MRI for images with contrast. Pain remains controlled.

## 2016-09-21 ENCOUNTER — Observation Stay (HOSPITAL_COMMUNITY): Payer: PPO

## 2016-09-21 DIAGNOSIS — G373 Acute transverse myelitis in demyelinating disease of central nervous system: Secondary | ICD-10-CM | POA: Diagnosis not present

## 2016-09-21 DIAGNOSIS — M5489 Other dorsalgia: Secondary | ICD-10-CM | POA: Diagnosis not present

## 2016-09-21 DIAGNOSIS — B0224 Postherpetic myelitis: Secondary | ICD-10-CM

## 2016-09-21 DIAGNOSIS — R32 Unspecified urinary incontinence: Secondary | ICD-10-CM | POA: Diagnosis not present

## 2016-09-21 DIAGNOSIS — R220 Localized swelling, mass and lump, head: Secondary | ICD-10-CM | POA: Diagnosis not present

## 2016-09-21 DIAGNOSIS — G0491 Myelitis, unspecified: Secondary | ICD-10-CM | POA: Diagnosis not present

## 2016-09-21 DIAGNOSIS — Z79891 Long term (current) use of opiate analgesic: Secondary | ICD-10-CM | POA: Diagnosis not present

## 2016-09-21 DIAGNOSIS — E876 Hypokalemia: Secondary | ICD-10-CM | POA: Diagnosis not present

## 2016-09-21 DIAGNOSIS — Z79899 Other long term (current) drug therapy: Secondary | ICD-10-CM | POA: Diagnosis not present

## 2016-09-21 DIAGNOSIS — R200 Anesthesia of skin: Secondary | ICD-10-CM | POA: Diagnosis not present

## 2016-09-21 DIAGNOSIS — F419 Anxiety disorder, unspecified: Secondary | ICD-10-CM | POA: Diagnosis not present

## 2016-09-21 DIAGNOSIS — R836 Abnormal cytological findings in cerebrospinal fluid: Secondary | ICD-10-CM | POA: Diagnosis not present

## 2016-09-21 DIAGNOSIS — B029 Zoster without complications: Secondary | ICD-10-CM | POA: Diagnosis not present

## 2016-09-21 DIAGNOSIS — Z888 Allergy status to other drugs, medicaments and biological substances status: Secondary | ICD-10-CM | POA: Diagnosis not present

## 2016-09-21 DIAGNOSIS — R9089 Other abnormal findings on diagnostic imaging of central nervous system: Secondary | ICD-10-CM | POA: Diagnosis not present

## 2016-09-21 DIAGNOSIS — B0082 Herpes simplex myelitis: Secondary | ICD-10-CM | POA: Diagnosis not present

## 2016-09-21 DIAGNOSIS — E871 Hypo-osmolality and hyponatremia: Secondary | ICD-10-CM | POA: Diagnosis not present

## 2016-09-21 DIAGNOSIS — Z90710 Acquired absence of both cervix and uterus: Secondary | ICD-10-CM | POA: Diagnosis not present

## 2016-09-21 DIAGNOSIS — Z881 Allergy status to other antibiotic agents status: Secondary | ICD-10-CM | POA: Diagnosis not present

## 2016-09-21 DIAGNOSIS — G959 Disease of spinal cord, unspecified: Secondary | ICD-10-CM | POA: Diagnosis not present

## 2016-09-21 DIAGNOSIS — G0490 Encephalitis and encephalomyelitis, unspecified: Secondary | ICD-10-CM | POA: Diagnosis not present

## 2016-09-21 DIAGNOSIS — R159 Full incontinence of feces: Secondary | ICD-10-CM | POA: Diagnosis not present

## 2016-09-21 DIAGNOSIS — Z5181 Encounter for therapeutic drug level monitoring: Secondary | ICD-10-CM | POA: Diagnosis not present

## 2016-09-21 DIAGNOSIS — Z880 Allergy status to penicillin: Secondary | ICD-10-CM | POA: Diagnosis not present

## 2016-09-21 DIAGNOSIS — B028 Zoster with other complications: Secondary | ICD-10-CM | POA: Diagnosis not present

## 2016-09-21 DIAGNOSIS — R202 Paresthesia of skin: Secondary | ICD-10-CM | POA: Diagnosis not present

## 2016-09-21 DIAGNOSIS — R140 Abdominal distension (gaseous): Secondary | ICD-10-CM | POA: Diagnosis not present

## 2016-09-21 DIAGNOSIS — D72829 Elevated white blood cell count, unspecified: Secondary | ICD-10-CM | POA: Diagnosis not present

## 2016-09-21 DIAGNOSIS — T380X5A Adverse effect of glucocorticoids and synthetic analogues, initial encounter: Secondary | ICD-10-CM | POA: Diagnosis not present

## 2016-09-21 DIAGNOSIS — M549 Dorsalgia, unspecified: Secondary | ICD-10-CM

## 2016-09-21 DIAGNOSIS — Z87891 Personal history of nicotine dependence: Secondary | ICD-10-CM | POA: Diagnosis not present

## 2016-09-21 LAB — CBC WITH DIFFERENTIAL/PLATELET
BASOS ABS: 0 10*3/uL (ref 0.0–0.1)
Basophils Relative: 0 %
EOS PCT: 0 %
Eosinophils Absolute: 0 10*3/uL (ref 0.0–0.7)
HEMATOCRIT: 43.5 % (ref 36.0–46.0)
Hemoglobin: 14.7 g/dL (ref 12.0–15.0)
LYMPHS PCT: 12 %
Lymphs Abs: 1 10*3/uL (ref 0.7–4.0)
MCH: 31.6 pg (ref 26.0–34.0)
MCHC: 33.8 g/dL (ref 30.0–36.0)
MCV: 93.5 fL (ref 78.0–100.0)
MONO ABS: 0 10*3/uL — AB (ref 0.1–1.0)
MONOS PCT: 0 %
Neutro Abs: 7.7 10*3/uL (ref 1.7–7.7)
Neutrophils Relative %: 88 %
PLATELETS: 221 10*3/uL (ref 150–400)
RBC: 4.65 MIL/uL (ref 3.87–5.11)
RDW: 12.8 % (ref 11.5–15.5)
WBC: 8.8 10*3/uL (ref 4.0–10.5)

## 2016-09-21 LAB — URINALYSIS, ROUTINE W REFLEX MICROSCOPIC
Bilirubin Urine: NEGATIVE
GLUCOSE, UA: 50 mg/dL — AB
Ketones, ur: 20 mg/dL — AB
Nitrite: NEGATIVE
PH: 5 (ref 5.0–8.0)
Protein, ur: NEGATIVE mg/dL
Specific Gravity, Urine: 1.02 (ref 1.005–1.030)

## 2016-09-21 LAB — COMPREHENSIVE METABOLIC PANEL
ALT: 34 U/L (ref 14–54)
ANION GAP: 12 (ref 5–15)
AST: 34 U/L (ref 15–41)
Albumin: 3.7 g/dL (ref 3.5–5.0)
Alkaline Phosphatase: 62 U/L (ref 38–126)
BILIRUBIN TOTAL: 1.1 mg/dL (ref 0.3–1.2)
BUN: 19 mg/dL (ref 6–20)
CHLORIDE: 99 mmol/L — AB (ref 101–111)
CO2: 20 mmol/L — ABNORMAL LOW (ref 22–32)
Calcium: 9 mg/dL (ref 8.9–10.3)
Creatinine, Ser: 0.84 mg/dL (ref 0.44–1.00)
GFR calc Af Amer: >60 mL/min (ref >60)
Glucose, Bld: 117 mg/dL — ABNORMAL HIGH (ref 65–99)
POTASSIUM: 4.3 mmol/L (ref 3.5–5.1)
Sodium: 131 mmol/L — ABNORMAL LOW (ref 135–145)
Total Protein: 6.4 g/dL — ABNORMAL LOW (ref 6.5–8.1)

## 2016-09-21 LAB — APTT: aPTT: 27 seconds (ref 24–36)

## 2016-09-21 LAB — HIV ANTIBODY (ROUTINE TESTING W REFLEX): HIV Screen 4th Generation wRfx: NONREACTIVE

## 2016-09-21 LAB — VITAMIN B12: VITAMIN B 12: 569 pg/mL (ref 180–914)

## 2016-09-21 MED ORDER — DEXTROSE 5 % IV SOLN
10.0000 mg/kg | Freq: Three times a day (TID) | INTRAVENOUS | Status: DC
  Administered 2016-09-21 – 2016-09-26 (×17): 650 mg via INTRAVENOUS
  Filled 2016-09-21 (×19): qty 13

## 2016-09-21 NOTE — Progress Notes (Signed)
[facility] PROGRESS NOTE  Katelyn Lamb [id number] DOB: 1946-08-08 DOA: 09/20/2016 PCP: No PCP Per Patient  Assessment/Plan: 71 y.o. female with medical history significant of anxiety and chronic back pain who is coming to the emergency department with progressively worse lower back pain and numbness that radiates to her lower extremities since 09/04/2016. She had left sided several vesicular dermatomal lesions on admission. MRI is suspicious for myelitis.   Probable transverse myelitis. MRI c/t/l spine: Abnormal spinal cord signal and enhancement without discrete mass. 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 Recommend correlation with cerebral spinal fluid studies. MRI brain: Cortical diffusion and FLAIR signal abnormality with some increased sulcal FLAIR signal centered in the anterior paramedian frontal and cingulate gyri. 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 appreciate the input   Urinary incontinence likely due to above. Non focal neuro exam, motor/sensory is preserved. Check bladder scan. May need foley, check UA   Shingles, vzv rash. Left sided dermatomal distribution. On acyclovir. Contact isolation.   Code Status: full Family Communication: d/w patient, her husband  (indicate person spoken with, relationship, and if by phone, the number) Disposition Plan: pend PT   Consultants:  Neurology   Procedures:  Pend LP  Antibiotics:  Acyclovir 1/19>>> (indicate start date, and stop date if known)  HPI/Subjective: Alert, no distress. Reports chronic back pains, no focal weakness   Objective: Vitals:   09/20/16 2108 09/21/16 0428  BP: 121/67 116/70  Pulse: 93 89  Resp: 20 18  Temp: 97.9 F (36.6 C) 99.5 F (37.5 C)    Intake/Output Summary (Last 24 hours) at 09/21/16 1218 Last data filed at  09/21/16 1039  Gross per 24 hour  Intake           815.25 ml  Output                2 ml  Net           813.25 ml   Filed Weights   09/20/16 1157 09/20/16 2108  Weight: 65.8 kg (145 lb) 65 kg (143 lb 3.2 oz)    Exam:   General:  Comfortable   Cardiovascular: s1,s2 rrr  Respiratory: CTA BL  Abdomen: soft, nt, nd   Musculoskeletal: no pedal edema    Data Reviewed: Basic Metabolic Panel:  Recent Labs Lab 09/20/16 1420 09/21/16 0655  NA 134* 131*  K 3.9 4.3  CL 96* 99*  CO2 23 20*  GLUCOSE 70 117*  BUN 24* 19  CREATININE 1.00 0.84  CALCIUM 10.0 9.0   Liver Function Tests:  Recent Labs Lab 09/21/16 0655  AST 34  ALT 34  ALKPHOS 62  BILITOT 1.1  PROT 6.4*  ALBUMIN 3.7   No results for input(s): LIPASE, AMYLASE in the last 168 hours. No results for input(s): AMMONIA in the last 168 hours. CBC:  Recent Labs Lab 09/20/16 1420 09/21/16 0655  WBC 10.9* 8.8  NEUTROABS 7.4 7.7  HGB 15.8* 14.7  HCT 47.4* 43.5  MCV 95.0 93.5  PLT 272 221   Cardiac Enzymes:  Recent Labs Lab 09/20/16 1420  CKTOTAL 200   BNP (last 3 results) No results for input(s): BNP in the last 8760 hours.  ProBNP (last 3 results) No results for input(s): PROBNP in the last 8760 hours.  CBG: No results for input(s): GLUCAP in the last 168 hours.  No  results found for this or any previous visit (from the past 240 hour(s)).   Studies: Mr Brain 45 Contrast  Result Date: 09/21/2016 CLINICAL DATA:  71 y/o F; worsening back pain and lower extremity weakness/numbness. EXAM: MRI HEAD WITHOUT CONTRAST TECHNIQUE: Multiplanar, multiecho pulse sequences of the brain and surrounding structures were obtained without intravenous contrast. COMPARISON:  09/20/2016 cervical MRI.  08/01/2016 MRI of the brain. FINDINGS: Brain: Increased T2 FLAIR signal within the right paramedian frontal sulci (series 12, image 157). Within the bilateral paramedian cingulate and right frontal lobes in the region  of incomplete FLAIR suppression there is cortical diffusion restriction and increased T2 FLAIR hyperintense signal (series 7 image 17 and series 5, image 26). Humerus T2 FLAIR hyperintense foci in white matter in both periventricular and subcortical white matter are similar in distribution in comparison with the prior MRI. On the sagittal T2 FLAIR volumetric sequence there are foci of signal abnormality at the cervicomedullary junction and upper cervical cord better characterized on the prior cervical MRI. No abnormal susceptibility hypointensity. No focal mass effect. No hydrocephalus. Vascular: Normal flow voids. Skull and upper cervical spine: Normal marrow signal. Sinuses/Orbits: Right mastoid effusion. No abnormal signal of paranasal sinuses or left mastoid air cell. Bilateral intra-ocular lens replacement. Other: None. IMPRESSION: 1. Cortical diffusion and FLAIR signal abnormality with some increased sulcal FLAIR signal centered in the anterior paramedian frontal and cingulate gyri. 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 The pattern of meningeal and cortical inflammation is not typical of multiple sclerosis or neuromyelitis optica favoring an infectious or other autoimmune etiology. Consider follow-up with MRI of the brain with contrast. 2. Stable nonspecific T2 FLAIR hyperintense white matter foci may represent microvascular ischemic changes particularly in the setting of diabetes or hypertension or sequelae of demyelination, vasculitis, and other infectious/inflammatory processes. Electronically Signed   By: Kristine Garbe M.D.   On: 09/21/2016 02:57   Mr Lumbar Spine Wo Contrast  Result Date: 09/20/2016 CLINICAL DATA:  Acute on chronic low back pain radiating to buttocks and LEFT lower extremity with numbness and tingling. On Vicodin with minimal relief. EXAM: MRI CERVICAL, THORACIC SPINE WITH AND WITHOUT CONTRAST MR LUMBAR  SPINE WITHOUT TECHNIQUE: Multiplanar and multiecho pulse sequences of the cervical spine, to include the craniocervical junction and cervicothoracic junction, and thoracic with and without contrast. Multiplanar and multi echo pulse sequences of the lumbar spine, were obtained without intravenous contrast. CONTRAST:  15 cc MultiHance COMPARISON:  CT chest May 19, 2014 and MRI of lumbar spine February 26, 2008 FINDINGS: MRI CERVICAL SPINE FINDINGS ALIGNMENT: Straightened cervical lordosis.  No malalignment. VERTEBRAE/DISCS: Vertebral bodies are intact. Moderate to severe C3-4 thru C6-7 disc height loss of with moderate chronic discogenic endplate changes and disc desiccation. No suspicious bone marrow signal. No suspicious osseous or intradiscal enhancement. CORD:Expansile T2 bright signal and patchy varying enhancement predominately involving the posterior columns C2 through C4-5 and more central within the spinal cord at C5 through C7-T1. No myelomalacia. No syrinx. No abnormal leptomeningeal or epidural enhancement. POSTERIOR FOSSA, VERTEBRAL ARTERIES, PARASPINAL TISSUES: No MR findings of ligamentous injury. Vertebral artery flow voids present. Included posterior fossa and paraspinal soft tissues are normal. DISC LEVELS: C2-3: Small central disc protrusion. Mild facet arthropathy without canal stenosis or neural foraminal narrowing. C3-4: 2 mm broad-based disc bulge, uncovertebral hypertrophy and mild facet arthropathy. Mild canal stenosis. Moderate RIGHT, mild to moderate LEFT neural foraminal narrowing. C4-5: Annular bulging, uncovertebral hypertrophy and mild  facet arthropathy. Mild canal stenosis. Moderate RIGHT and moderate to severe LEFT neural foraminal narrowing. C5-6: Broad-based disc bulge and LEFT central disc protrusion in total measure 3 mm in AP dimension. Moderate to severe canal stenosis, AP dimension the canal is 6 mm. Ventral cord deformity. Severe RIGHT and moderate to severe LEFT neural  foraminal narrowing. C6-7: 2 mm broad-based disc bulge, uncovertebral hypertrophy and mild facet arthropathy. Moderate canal stenosis. Moderate to severe bilateral neural foraminal narrowing. C7-T1: No disc bulge, canal stenosis nor neural foraminal narrowing. MRI THORACIC SPINE FINDINGS ALIGNMENT: Maintenance of the thoracic kyphosis. No malalignment. VERTEBRAE/DISCS: Vertebral bodies are intact. Old minimally displaced sternal fracture. Intervertebral discs morphology and signal are normal. Multilevel mild chronic discogenic endplate changes/spurring. Moderate subacute on chronic discogenic endplate changes [id number]. Heterogeneous T1, bright STIR probable hemangioma T2, 10 mm. Additional large L1 hemangioma. No suspicious osseous or intradiscal enhancement. CORD: Patchy mildly expansile central spinal cord T2 hyperintense signal and varying enhancement T1-2 through T2, T5-6 through T7-8 with predominant dorsal cord involvement. Patchy T2 hyperintense signal within the conus medullaris. No syrinx. No abnormal leptomeningeal or epidural enhancement. No myelomalacia. PREVERTEBRAL AND PARASPINAL SOFT TISSUES:  Normal. DISC LEVELS: At T5-6 is 3 mm RIGHT central disc protrusion/extrusion without migration, contacting the ventral spinal cord without canal stenosis or neural foraminal narrowing at any level. MRI LUMBAR SPINE FINDINGS SEGMENTATION: For the purposes of this report, the last well-formed intervertebral disc will be described as L5-S1. ALIGNMENT: Maintenance of the lumbar lordosis. Grade 1 L4-5 anterolisthesis is similar, no spondylolysis. VERTEBRAE:Vertebral bodies are intact. Mild L4-5 and L5-S1 disc height loss is similar with decreased T2 signal within the lower lumbar disc compatible with mild desiccation. Mild chronic discogenic endplate changes 075-GRM and L5-S1. No abnormal bone marrow signal. No suspicious bone marrow signal. Large L1 hemangioma with additional scattered small hemangiomas. CONUS  MEDULLARIS: Conus medullaris terminates at L1-2. Cauda equina is normal. PARASPINAL AND SOFT TISSUES: Extremely distended urinary bladder. DISC LEVELS: L1-2: Small broad-based LEFT central disc protrusion, interval re- absorption of extrusion. No canal stenosis or neural foraminal narrowing. L2-3: No disc bulge, canal stenosis nor neural foraminal narrowing. L3-4: Annular bulging. Moderate facet arthropathy and ligamentum flavum redundancy with trace facet effusions which are likely reactive. No canal stenosis. Mild neural foraminal narrowing. L4-5: Anterolisthesis. Small broad-based disc bulge. Severe facet arthropathy and ligamentum flavum redundancy with trace facet effusions which are likely reactive. Mild canal stenosis including partial effacement lateral recesses which may affect the traversing L5 nerves. Moderate to severe RIGHT, moderate LEFT neural foraminal narrowing. L5-S1: Small broad-based disc bulge. Moderate to severe RIGHT, moderate LEFT facet arthropathy. Trace RIGHT facet effusion is likely reactive. No canal stenosis. Mild LEFT neural foraminal narrowing. IMPRESSION: MRI CERVICAL SPINE: Abnormal spinal cord signal and enhancement without discrete mass. 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 Recommend correlation with cerebral spinal fluid studies. Degenerative cervical spine results in moderate to severe canal stenosis C5-6, moderate at C6-7. Neural foraminal narrowing C3-4 thru C6-7: Severe on the RIGHT at C5-6, moderate to severe multiple levels. MRI THORACIC SPINE: Abnormal spinal cord signal and enhancement without discrete mass. 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 Recommend correlation with cerebral spinal fluid studies. Small T5-6 disc protrusion/extrusion without canal stenosis or neural foraminal narrowing. MRI LUMBAR SPINE: Re- absorption of L1-2 disc extrusion with small residual protrusion. Degenerative lumbar spine resulting in mild canal stenosis  L4-5, similar grade 1 L4-5 anterolisthesis on degenerative basis. Neural foraminal narrowing L3-4 through L5-S1: Moderate to severe  on the RIGHT at L4-5. Preliminary acute findings discussed with and reconfirmed by PA.JAIME WARD on 09/20/2016 at 5:35 pm. Electronically Signed   By: Elon Alas M.D.   On: 09/20/2016 19:19   Mr Cervical Spine W Wo Contrast  Result Date: 09/20/2016 CLINICAL DATA:  Acute on chronic low back pain radiating to buttocks and LEFT lower extremity with numbness and tingling. On Vicodin with minimal relief. EXAM: MRI CERVICAL, THORACIC SPINE WITH AND WITHOUT CONTRAST MR LUMBAR SPINE WITHOUT TECHNIQUE: Multiplanar and multiecho pulse sequences of the cervical spine, to include the craniocervical junction and cervicothoracic junction, and thoracic with and without contrast. Multiplanar and multi echo pulse sequences of the lumbar spine, were obtained without intravenous contrast. CONTRAST:  15 cc MultiHance COMPARISON:  CT chest May 19, 2014 and MRI of lumbar spine February 26, 2008 FINDINGS: MRI CERVICAL SPINE FINDINGS ALIGNMENT: Straightened cervical lordosis.  No malalignment. VERTEBRAE/DISCS: Vertebral bodies are intact. Moderate to severe C3-4 thru C6-7 disc height loss of with moderate chronic discogenic endplate changes and disc desiccation. No suspicious bone marrow signal. No suspicious osseous or intradiscal enhancement. CORD:Expansile T2 bright signal and patchy varying enhancement predominately involving the posterior columns C2 through C4-5 and more central within the spinal cord at C5 through C7-T1. No myelomalacia. No syrinx. No abnormal leptomeningeal or epidural enhancement. POSTERIOR FOSSA, VERTEBRAL ARTERIES, PARASPINAL TISSUES: No MR findings of ligamentous injury. Vertebral artery flow voids present. Included posterior fossa and paraspinal soft tissues are normal. DISC LEVELS: C2-3: Small central disc protrusion. Mild facet arthropathy without canal stenosis or  neural foraminal narrowing. C3-4: 2 mm broad-based disc bulge, uncovertebral hypertrophy and mild facet arthropathy. Mild canal stenosis. Moderate RIGHT, mild to moderate LEFT neural foraminal narrowing. C4-5: Annular bulging, uncovertebral hypertrophy and mild facet arthropathy. Mild canal stenosis. Moderate RIGHT and moderate to severe LEFT neural foraminal narrowing. C5-6: Broad-based disc bulge and LEFT central disc protrusion in total measure 3 mm in AP dimension. Moderate to severe canal stenosis, AP dimension the canal is 6 mm. Ventral cord deformity. Severe RIGHT and moderate to severe LEFT neural foraminal narrowing. C6-7: 2 mm broad-based disc bulge, uncovertebral hypertrophy and mild facet arthropathy. Moderate canal stenosis. Moderate to severe bilateral neural foraminal narrowing. C7-T1: No disc bulge, canal stenosis nor neural foraminal narrowing. MRI THORACIC SPINE FINDINGS ALIGNMENT: Maintenance of the thoracic kyphosis. No malalignment. VERTEBRAE/DISCS: Vertebral bodies are intact. Old minimally displaced sternal fracture. Intervertebral discs morphology and signal are normal. Multilevel mild chronic discogenic endplate changes/spurring. Moderate subacute on chronic discogenic endplate changes [id number]. Heterogeneous T1, bright STIR probable hemangioma T2, 10 mm. Additional large L1 hemangioma. No suspicious osseous or intradiscal enhancement. CORD: Patchy mildly expansile central spinal cord T2 hyperintense signal and varying enhancement T1-2 through T2, T5-6 through T7-8 with predominant dorsal cord involvement. Patchy T2 hyperintense signal within the conus medullaris. No syrinx. No abnormal leptomeningeal or epidural enhancement. No myelomalacia. PREVERTEBRAL AND PARASPINAL SOFT TISSUES:  Normal. DISC LEVELS: At T5-6 is 3 mm RIGHT central disc protrusion/extrusion without migration, contacting the ventral spinal cord without canal stenosis or neural foraminal narrowing at any level. MRI LUMBAR  SPINE FINDINGS SEGMENTATION: For the purposes of this report, the last well-formed intervertebral disc will be described as L5-S1. ALIGNMENT: Maintenance of the lumbar lordosis. Grade 1 L4-5 anterolisthesis is similar, no spondylolysis. VERTEBRAE:Vertebral bodies are intact. Mild L4-5 and L5-S1 disc height loss is similar with decreased T2 signal within the lower lumbar disc compatible with mild desiccation. Mild chronic discogenic endplate changes 075-GRM and L5-S1.  No abnormal bone marrow signal. No suspicious bone marrow signal. Large L1 hemangioma with additional scattered small hemangiomas. CONUS MEDULLARIS: Conus medullaris terminates at L1-2. Cauda equina is normal. PARASPINAL AND SOFT TISSUES: Extremely distended urinary bladder. DISC LEVELS: L1-2: Small broad-based LEFT central disc protrusion, interval re- absorption of extrusion. No canal stenosis or neural foraminal narrowing. L2-3: No disc bulge, canal stenosis nor neural foraminal narrowing. L3-4: Annular bulging. Moderate facet arthropathy and ligamentum flavum redundancy with trace facet effusions which are likely reactive. No canal stenosis. Mild neural foraminal narrowing. L4-5: Anterolisthesis. Small broad-based disc bulge. Severe facet arthropathy and ligamentum flavum redundancy with trace facet effusions which are likely reactive. Mild canal stenosis including partial effacement lateral recesses which may affect the traversing L5 nerves. Moderate to severe RIGHT, moderate LEFT neural foraminal narrowing. L5-S1: Small broad-based disc bulge. Moderate to severe RIGHT, moderate LEFT facet arthropathy. Trace RIGHT facet effusion is likely reactive. No canal stenosis. Mild LEFT neural foraminal narrowing. IMPRESSION: MRI CERVICAL SPINE: Abnormal spinal cord signal and enhancement without discrete mass. 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 Recommend correlation with cerebral spinal fluid studies. Degenerative cervical spine results in  moderate to severe canal stenosis C5-6, moderate at C6-7. Neural foraminal narrowing C3-4 thru C6-7: Severe on the RIGHT at C5-6, moderate to severe multiple levels. MRI THORACIC SPINE: Abnormal spinal cord signal and enhancement without discrete mass. 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 Recommend correlation with cerebral spinal fluid studies. Small T5-6 disc protrusion/extrusion without canal stenosis or neural foraminal narrowing. MRI LUMBAR SPINE: Re- absorption of L1-2 disc extrusion with small residual protrusion. Degenerative lumbar spine resulting in mild canal stenosis L4-5, similar grade 1 L4-5 anterolisthesis on degenerative basis. Neural foraminal narrowing L3-4 through L5-S1: Moderate to severe on the RIGHT at L4-5. Preliminary acute findings discussed with and reconfirmed by PA.JAIME WARD on 09/20/2016 at 5:35 pm. Electronically Signed   By: Elon Alas M.D.   On: 09/20/2016 19:19   Mr Thoracic Spine W Wo Contrast  Result Date: 09/20/2016 CLINICAL DATA:  Acute on chronic low back pain radiating to buttocks and LEFT lower extremity with numbness and tingling. On Vicodin with minimal relief. EXAM: MRI CERVICAL, THORACIC SPINE WITH AND WITHOUT CONTRAST MR LUMBAR SPINE WITHOUT TECHNIQUE: Multiplanar and multiecho pulse sequences of the cervical spine, to include the craniocervical junction and cervicothoracic junction, and thoracic with and without contrast. Multiplanar and multi echo pulse sequences of the lumbar spine, were obtained without intravenous contrast. CONTRAST:  15 cc MultiHance COMPARISON:  CT chest May 19, 2014 and MRI of lumbar spine February 26, 2008 FINDINGS: MRI CERVICAL SPINE FINDINGS ALIGNMENT: Straightened cervical lordosis.  No malalignment. VERTEBRAE/DISCS: Vertebral bodies are intact. Moderate to severe C3-4 thru C6-7 disc height loss of with moderate chronic discogenic endplate changes and disc desiccation. No suspicious bone marrow signal. No  suspicious osseous or intradiscal enhancement. CORD:Expansile T2 bright signal and patchy varying enhancement predominately involving the posterior columns C2 through C4-5 and more central within the spinal cord at C5 through C7-T1. No myelomalacia. No syrinx. No abnormal leptomeningeal or epidural enhancement. POSTERIOR FOSSA, VERTEBRAL ARTERIES, PARASPINAL TISSUES: No MR findings of ligamentous injury. Vertebral artery flow voids present. Included posterior fossa and paraspinal soft tissues are normal. DISC LEVELS: C2-3: Small central disc protrusion. Mild facet arthropathy without canal stenosis or neural foraminal narrowing. C3-4: 2 mm broad-based disc bulge, uncovertebral hypertrophy and mild facet arthropathy. Mild canal stenosis. Moderate RIGHT, mild to moderate LEFT neural foraminal narrowing. C4-5: Annular bulging, uncovertebral  hypertrophy and mild facet arthropathy. Mild canal stenosis. Moderate RIGHT and moderate to severe LEFT neural foraminal narrowing. C5-6: Broad-based disc bulge and LEFT central disc protrusion in total measure 3 mm in AP dimension. Moderate to severe canal stenosis, AP dimension the canal is 6 mm. Ventral cord deformity. Severe RIGHT and moderate to severe LEFT neural foraminal narrowing. C6-7: 2 mm broad-based disc bulge, uncovertebral hypertrophy and mild facet arthropathy. Moderate canal stenosis. Moderate to severe bilateral neural foraminal narrowing. C7-T1: No disc bulge, canal stenosis nor neural foraminal narrowing. MRI THORACIC SPINE FINDINGS ALIGNMENT: Maintenance of the thoracic kyphosis. No malalignment. VERTEBRAE/DISCS: Vertebral bodies are intact. Old minimally displaced sternal fracture. Intervertebral discs morphology and signal are normal. Multilevel mild chronic discogenic endplate changes/spurring. Moderate subacute on chronic discogenic endplate changes [id number]. Heterogeneous T1, bright STIR probable hemangioma T2, 10 mm. Additional large L1 hemangioma. No  suspicious osseous or intradiscal enhancement. CORD: Patchy mildly expansile central spinal cord T2 hyperintense signal and varying enhancement T1-2 through T2, T5-6 through T7-8 with predominant dorsal cord involvement. Patchy T2 hyperintense signal within the conus medullaris. No syrinx. No abnormal leptomeningeal or epidural enhancement. No myelomalacia. PREVERTEBRAL AND PARASPINAL SOFT TISSUES:  Normal. DISC LEVELS: At T5-6 is 3 mm RIGHT central disc protrusion/extrusion without migration, contacting the ventral spinal cord without canal stenosis or neural foraminal narrowing at any level. MRI LUMBAR SPINE FINDINGS SEGMENTATION: For the purposes of this report, the last well-formed intervertebral disc will be described as L5-S1. ALIGNMENT: Maintenance of the lumbar lordosis. Grade 1 L4-5 anterolisthesis is similar, no spondylolysis. VERTEBRAE:Vertebral bodies are intact. Mild L4-5 and L5-S1 disc height loss is similar with decreased T2 signal within the lower lumbar disc compatible with mild desiccation. Mild chronic discogenic endplate changes [id number]-GRM and L5-S1. No abnormal bone marrow signal. No suspicious bone marrow signal. Large L1 hemangioma with additional scattered small hemangiomas. CONUS MEDULLARIS: Conus medullaris terminates at L1-2. Cauda equina is normal. PARASPINAL AND SOFT TISSUES: Extremely distended urinary bladder. DISC LEVELS: L1-2: Small broad-based LEFT central disc protrusion, interval re- absorption of extrusion. No canal stenosis or neural foraminal narrowing. L2-3: No disc bulge, canal stenosis nor neural foraminal narrowing. L3-4: Annular bulging. Moderate facet arthropathy and ligamentum flavum redundancy with trace facet effusions which are likely reactive. No canal stenosis. Mild neural foraminal narrowing. L4-5: Anterolisthesis. Small broad-based disc bulge. Severe facet arthropathy and ligamentum flavum redundancy with trace facet effusions which are likely reactive. Mild canal  stenosis including partial effacement lateral recesses which may affect the traversing L5 nerves. Moderate to severe RIGHT, moderate LEFT neural foraminal narrowing. L5-S1: Small broad-based disc bulge. Moderate to severe RIGHT, moderate LEFT facet arthropathy. Trace RIGHT facet effusion is likely reactive. No canal stenosis. Mild LEFT neural foraminal narrowing. IMPRESSION: MRI CERVICAL SPINE: Abnormal spinal cord signal and enhancement without discrete mass. 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 Recommend correlation with cerebral spinal fluid studies. Degenerative cervical spine results in moderate to severe canal stenosis C5-6, moderate at C6-7. Neural foraminal narrowing C3-4 thru C6-7: Severe on the RIGHT at C5-6, moderate to severe multiple levels. MRI THORACIC SPINE: Abnormal spinal cord signal and enhancement without discrete mass. 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 Recommend correlation with cerebral spinal fluid studies. Small T5-6 disc protrusion/extrusion without canal stenosis or neural foraminal narrowing. MRI LUMBAR SPINE: Re- absorption of L1-2 disc extrusion with small residual protrusion. Degenerative lumbar spine resulting in mild canal stenosis L4-5, similar grade 1 L4-5 anterolisthesis on degenerative basis. Neural foraminal narrowing L3-4 through L5-S1: Moderate  to severe on the RIGHT at L4-5. Preliminary acute findings discussed with and reconfirmed by PA.JAIME WARD on 09/20/2016 at 5:35 pm. Electronically Signed   By: Elon Alas M.D.   On: 09/20/2016 19:19    Scheduled Meds: . acyclovir  10 mg/kg Intravenous Q8H  . calcium carbonate  1 tablet Oral Q breakfast  . cholecalciferol  1,000 Units Oral Daily  . diazepam  5 mg Oral BID  . enoxaparin (LOVENOX) injection  40 mg Subcutaneous Q24H  . gabapentin  100 mg Oral TID  . methylPREDNISolone (SOLU-MEDROL) injection  500 mg Intravenous Q12H   Continuous Infusions: . sodium chloride 75  mL/hr at 09/21/16 0427    Principal Problem:   Numbness of left lower extremity Active Problems:   Intractable back pain   Hyponatremia   Herpes zoster    Time spent: >35 minutes     Kinnie Feil  [facility] Pager [phone]. If 7PM-7AM, please contact night-coverage at [email or web address], password [facility] 09/21/2016, 12:18 PM  LOS: 0 days

## 2016-09-21 NOTE — Care Management Obs Status (Signed)
[place] NOTIFICATION   Patient Details  Name: Katelyn Lamb MRN: [id number] Date of Birth: 1946-03-03   Medicare Observation Status Notification Given:  Yes    Erenest Rasher, RN 09/21/2016, 10:15 AM

## 2016-09-21 NOTE — Evaluation (Signed)
Physical Therapy Evaluation Patient Details Name: Katelyn Lamb MRN: [id number] DOB: 1946/07/26 Today's Date: 09/21/2016   History of Present Illness  Pt adm with progressive lower extremity weakness and incontinence. Pt found to have subacute progressive myelopathy with enhancing lesions most consistent with myelitis. Further work up in progress. Pt also with shingles. PMH - anxiety, back pain.  Clinical Impression  Pt admitted with above diagnosis and presents to PT with functional limitations due to deficits listed below (See PT problem list). Pt needs skilled PT to maximize independence and safety to allow discharge to home if husband able to provide assist. Amb distance limited by almost constant incontinence of urine despite toileting immediately on getting OOB.     Follow Up Recommendations Home health PT;Supervision for mobility/OOB    Equipment Recommendations  Rolling walker with 5" wheels    Recommendations for Other Services       Precautions / Restrictions Precautions Precautions: Fall Restrictions Weight Bearing Restrictions: No      Mobility  Bed Mobility Overal bed mobility: Needs Assistance Bed Mobility: Supine to Sit     Supine to sit: Min assist     General bed mobility comments: Assist to elevate trunk into sitting  Transfers Overall transfer level: Needs assistance Equipment used: Rolling walker (2 wheeled);1 person hand held assist Transfers: Sit to/from [facility] Sit to Stand: Min assist Stand pivot transfers: Min assist       General transfer comment: Assist to bring hips up and for balance. Performed pivotal steps from bed to bsc to recliner with bil forearm support.  Ambulation/Gait Ambulation/Gait assistance: Min assist Ambulation Distance (Feet): 30 Feet Assistive device: Rolling walker (2 wheeled) Gait Pattern/deviations: Step-through pattern;Decreased step length - right;Decreased step length - left;Ataxic Gait  velocity: decr Gait velocity interpretation: Below normal speed for age/gender General Gait Details: Assist for balance and support. Initially pt's LE's appeared slightly ataxic but that improved with incr distance. Pt incontinent of urine every time she stood and while she was amb which limited distance.  Stairs            Wheelchair Mobility    Modified Rankin (Stroke Patients Only)       Balance Overall balance assessment: Needs assistance Sitting-balance support: No upper extremity supported;Feet supported Sitting balance-Leahy Scale: Good     Standing balance support: Bilateral upper extremity supported Standing balance-Leahy Scale: Poor Standing balance comment: walker and min guard for static standing                             Pertinent Vitals/Pain      Home Living Family/patient expects to be discharged to:: Private residence Living Arrangements: Spouse/significant other Available Help at Discharge: Family;Available 24 hours/day Type of Home: House Home Access: Stairs to enter Entrance Stairs-Rails: None Entrance Stairs-Number of Steps: 3 Home Layout: Two level Home Equipment: None      Prior Function Level of Independence: Independent               Hand Dominance        Extremity/Trunk Assessment   Upper Extremity Assessment Upper Extremity Assessment: Overall WFL for tasks assessed    Lower Extremity Assessment Lower Extremity Assessment: RLE deficits/detail RLE Deficits / Details: strength 4/5, some ataxia LLE Deficits / Details: strength 4/5, some ataxia       Communication   Communication: No difficulties  Cognition Arousal/Alertness: Awake/alert Behavior During Therapy: WFL for tasks assessed/performed;Anxious Overall  Cognitive Status: Within Functional Limits for tasks assessed                      General Comments      Exercises     Assessment/Plan    PT Assessment Patient needs continued PT  services  PT Problem List Decreased strength;Decreased activity tolerance;Decreased balance;Decreased mobility;Decreased knowledge of use of DME          PT Treatment Interventions DME instruction;Gait training;Stair training;Functional mobility training;Therapeutic activities;Therapeutic exercise;Balance training;Neuromuscular re-education;Patient/family education    PT Goals (Current goals can be found in the Care Plan section)  Acute Rehab PT Goals Patient Stated Goal: return home PT Goal Formulation: With patient Time For Goal Achievement: 09/28/16 Potential to Achieve Goals: Good    Frequency Min 3X/week   Barriers to discharge Inaccessible home environment stairs to    Co-evaluation               End of Session Equipment Utilized During Treatment: Gait belt Activity Tolerance: Patient tolerated treatment well Patient left: in chair;with call bell/phone within reach Nurse Communication: Mobility status         Time: [id number] PT Time Calculation (min) (ACUTE ONLY): 36 min   Charges:         PT G CodesShary Decamp [place] 10/14/16, 2:23 PM [facility] PT [phone]

## 2016-09-21 NOTE — Progress Notes (Signed)
Indication: look for autoimmune versus infectious process.   Risks of the procedure were dicussed with the patient including post-LP headache, bleeding, infection, weakness/numbness of legs(radiculopathy), death.  The patient/patient's proxy agreed and written consent was obtained.   The patient was prepped and draped, and using sterile technique a 20 gauge quinke spinal needle was inserted in the L 3/4space. Do to patient not able to relax and curl well enough needle was not able to be passes into space. wil need to be done under IR.   Etta Quill PA-C [facility] [phone]  M-F  (8:30 am- 4 PM)  09/21/2016, 2:56 PM

## 2016-09-21 NOTE — Progress Notes (Signed)
Pharmacy Antibiotic Note  Katelyn Lamb is a 71 y.o. female admitted on 09/20/2016 with lower extremity weakness.  Pharmacy has been consulted for Acyclovir dosing for rule out VZV myelitis. WBC WNL. Renal function ok.   Plan: -Acyclovir 10 mg/kg IV q8h -Trend WBC, temp, renal function -F/U infectious work-up   Height: 5\' 6"  (167.6 cm) Weight: 143 lb 3.2 oz (65 kg) IBW/kg (Calculated) : 59.3  Temp (24hrs), Avg:98.8 F (37.1 C), Min:97.9 F (36.6 C), Max:99.5 F (37.5 C)   Recent Labs Lab 09/20/16 1420  WBC 10.9*  CREATININE 1.00    Estimated Creatinine Clearance: 50.4 mL/min (by C-G formula based on SCr of 1 mg/dL).    Allergies  Allergen Reactions  . Amoxicillin-Pot Clavulanate Other (See Comments)  . Demerol [Meperidine]     Hallucinations  . Penicillins     Katelyn Lamb 09/21/2016 4:59 AM

## 2016-09-21 NOTE — Care Management Note (Signed)
Case Management Note  Patient Details  Name: NAYVIE PELTO MRN: [id number] Date of Birth: [birth year]  Subjective/Objective:   Transverse myelitis                Action/Plan: Discharge Planning: NCM spoke to pt's husband and offered choice for HH/list provided. Husband agreeable to [facility]. Will need RW for home. Will notify [facility] and order DME closer to dc. Pt was having procedure at time of NCM visit.    Expected Discharge Date:                  Expected Discharge Plan:  [place]  In-House Referral:  NA  Discharge planning Services  CM Consult  Post Acute Care Choice:  Home Health Choice offered to:     DME Arranged:    DME Agency:     HH Arranged:  PT [place]:  [place]  Status of Service:  In process, will continue to follow  If discussed at Long Length of Stay Meetings, dates discussed:    Additional Comments:  Erenest Rasher, RN 09/21/2016, 5:55 PM

## 2016-09-22 ENCOUNTER — Inpatient Hospital Stay (HOSPITAL_COMMUNITY): Payer: PPO

## 2016-09-22 DIAGNOSIS — E871 Hypo-osmolality and hyponatremia: Secondary | ICD-10-CM

## 2016-09-22 LAB — CSF CELL COUNT WITH DIFFERENTIAL
LYMPHS CSF: 86 % — AB (ref 40–80)
MONOCYTE-MACROPHAGE-SPINAL FLUID: 14 % — AB (ref 15–45)
RBC Count, CSF: 34 /mm3 — ABNORMAL HIGH
TUBE #: 1
WBC, CSF: 490 /mm3 (ref 0–5)

## 2016-09-22 LAB — BASIC METABOLIC PANEL
ANION GAP: 12 (ref 5–15)
BUN: 15 mg/dL (ref 6–20)
CALCIUM: 8.8 mg/dL — AB (ref 8.9–10.3)
CO2: 18 mmol/L — AB (ref 22–32)
Chloride: 104 mmol/L (ref 101–111)
Creatinine, Ser: 0.7 mg/dL (ref 0.44–1.00)
GFR calc non Af Amer: >60 mL/min (ref >60)
GLUCOSE: 144 mg/dL — AB (ref 65–99)
POTASSIUM: 4.6 mmol/L (ref 3.5–5.1)
Sodium: 134 mmol/L — ABNORMAL LOW (ref 135–145)

## 2016-09-22 LAB — SJOGRENS SYNDROME-B EXTRACTABLE NUCLEAR ANTIBODY: SSB (La) (ENA) Antibody, IgG: <0.2 AI (ref 0.0–0.9)

## 2016-09-22 LAB — ANTIEXTRACTABLE NUCLEAR AG
ENA SM Ab Ser-aCnc: <0.2 AI (ref 0.0–0.9)
Ribonucleic Protein: <0.2 AI (ref 0.0–0.9)

## 2016-09-22 LAB — SJOGRENS SYNDROME-A EXTRACTABLE NUCLEAR ANTIBODY

## 2016-09-22 LAB — PROTEIN, CSF: TOTAL PROTEIN, CSF: 53 mg/dL — AB (ref 15–45)

## 2016-09-22 LAB — GLUCOSE, CSF: GLUCOSE CSF: 96 mg/dL — AB (ref 40–70)

## 2016-09-22 LAB — RHEUMATOID FACTOR: Rhuematoid fact SerPl-aCnc: <10 IU/mL (ref 0.0–13.9)

## 2016-09-22 NOTE — Progress Notes (Signed)
Patient ID: Katelyn Lamb, female   DOB: [birth year], 71 y.o.   MRN: [id number]                                                                PROGRESS NOTE                                                                                                                                                                                                             Patient Demographics:    Katelyn Lamb, is a 71 y.o. female, DOB - 12-01-1945, [id number]  Admit date - 09/20/2016   Admitting Physician Katelyn Milan, MD  Outpatient Primary MD for the patient is No PCP Per Patient  LOS - 1  Outpatient Specialists:   Chief Complaint  Patient presents with  . Back Pain       Brief Narrative 71 y.o.femalewith medical history significant of anxiety and chronic back pain who is coming to the emergency department with progressively worse lower back pain and numbness that radiates to her lower extremities since 09/04/2016. She had left sided several vesicular dermatomal lesions on admission. MRI is suspicious for myelitis.   Probable transverse myelitis. MRI c/t/l spine: Abnormal spinal cord signal and enhancement without discrete mass. 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 Recommend correlation with cerebral spinal fluid studies. MRI brain: Cortical diffusion and FLAIR signal abnormality with some increased sulcal FLAIR signal centered in the anterior paramedian frontal and cingulate gyri. Findings are most consistent with a primary encephalitis with a component of meningitis, possibly reactive - On iv steroids, cont acyclovir iv, Awaiting IR LP    Subjective:    Katelyn Lamb today is eating and feeling like numbness, tingling slightly better in her left leg.  Slight new rash in the left groin area.    Pt denies headache, photophobia, neck stiffness, cp, palp, sob, weakness.     Assessment  & Plan :    Principal Problem:   Numbness of left lower extremity Active Problems:  Intractable back pain   Hyponatremia   Herpes zoster   1. Shingles/  R/o transverse myelitis/encephalitis Cont iv acyclovir, cont iv steroids LP today Appreciate neurology input  2. Hyponatremia, mild Check cmp in am  3. Rash likely to to zoster Cont acyclovir and contact isolation  Code Status : FULL CODE  Family Communication  : w patient  Disposition Plan  : home  Barriers For Discharge :   Consults  :  neurology  Procedures  : LP today  DVT Prophylaxis  :   SCDs   Lab Results  Component Value Date   PLT 221 09/21/2016    Antibiotics  :    Anti-infectives    Start     Dose/Rate Route Frequency Ordered Stop   09/21/16 0500  acyclovir (ZOVIRAX) 650 mg in dextrose 5 % 100 mL IVPB     10 mg/kg  65 kg 113 mL/hr over 60 Minutes Intravenous Every 8 hours 09/21/16 0458     09/20/16 2200  valACYclovir (VALTREX) tablet 500 mg  Status:  Discontinued     500 mg Oral 2 times daily 09/20/16 2106 09/21/16 0453        Objective:   Vitals:   09/21/16 0428 09/21/16 1425 09/21/16 2029 09/22/16 0601  BP: 116/70 118/68 127/82 131/79  Pulse: 89 85 82 78  Resp: 18 18 17 20   Temp: 99.5 F (37.5 C) 98 F (36.7 C) 97.9 F (36.6 C) 98.2 F (36.8 C)  TempSrc: Oral Oral Oral Oral  SpO2: 95% 95% 96% 96%  Weight:      Height:        Wt Readings from Last 3 Encounters:  09/20/16 65 kg (143 lb 3.2 oz)  06/24/14 67.9 kg (149 lb 12.8 oz)  05/19/14 65.8 kg (145 lb)     Intake/Output Summary (Last 24 hours) at 09/22/16 0828 Last data filed at 09/22/16 0600  Gross per 24 hour  Intake             2735 ml  Output             2900 ml  Net             -165 ml     Physical Exam  Awake Alert, Oriented X 3, No new F.N deficits, Normal affect [place].AT,PERRAL Supple Neck,No JVD, No cervical lymphadenopathy appriciated.  Symmetrical Chest wall movement, Good air movement bilaterally, CTAB RRR,No Gallops,Rubs or new Murmurs, No Parasternal Heave +ve B.Sounds, Abd  Soft, No tenderness, No organomegaly appriciated, No rebound - guarding or rigidity. No Cyanosis, Clubbing or edema,  Vesicular rash in the left groin area and also along the outer aspect of the left lateral leg around knee    Data Review:    CBC  Recent Labs Lab 09/20/16 1420 09/21/16 0655  WBC 10.9* 8.8  HGB 15.8* 14.7  HCT 47.4* 43.5  PLT 272 221  MCV 95.0 93.5  MCH 31.7 31.6  MCHC 33.3 33.8  RDW 12.9 12.8  LYMPHSABS 2.5 1.0  MONOABS 0.9 0.0*  EOSABS 0.1 0.0  BASOSABS 0.0 0.0    Chemistries   Recent Labs Lab 09/20/16 1420 09/21/16 0655 09/22/16 0435  NA 134* 131* 134*  K 3.9 4.3 4.6  CL 96* 99* 104  CO2 23 20* 18*  GLUCOSE 70 117* 144*  BUN 24* 19 15  CREATININE 1.00 0.84 0.70  CALCIUM 10.0 9.0 8.8*  AST  --  34  --   ALT  --  34  --   ALKPHOS  --  62  --   BILITOT  --  1.1  --    ------------------------------------------------------------------------------------------------------------------ No results for input(s): CHOL, HDL, LDLCALC, TRIG, CHOLHDL, LDLDIRECT in the last 72 hours.  No results found for: HGBA1C ------------------------------------------------------------------------------------------------------------------ No results for input(s):  TSH, T4TOTAL, T3FREE, THYROIDAB in the last 72 hours.  Invalid input(s): FREET3 ------------------------------------------------------------------------------------------------------------------  Recent Labs  09/21/16 0655  VITAMINB12 569    Coagulation profile No results for input(s): INR, PROTIME in the last 168 hours.  No results for input(s): DDIMER in the last 72 hours.  Cardiac Enzymes No results for input(s): CKMB, TROPONINI, MYOGLOBIN in the last 168 hours.  Invalid input(s): CK ------------------------------------------------------------------------------------------------------------------ No results found for: BNP  Inpatient Medications  Scheduled Meds: . acyclovir  10 mg/kg  Intravenous Q8H  . calcium carbonate  1 tablet Oral Q breakfast  . cholecalciferol  1,000 Units Oral Daily  . diazepam  5 mg Oral BID  . gabapentin  100 mg Oral TID  . methylPREDNISolone (SOLU-MEDROL) injection  500 mg Intravenous Q12H   Continuous Infusions: . sodium chloride 75 mL/hr at 09/21/16 0427   PRN Meds:.ALPRAZolam, diphenhydrAMINE, HYDROcodone-acetaminophen, ketorolac, ondansetron **OR** ondansetron (ZOFRAN) IV  Micro Results No results found for this or any previous visit (from the past 240 hour(s)).  Radiology Reports Dg Lumbar Spine Complete  Result Date: 09/09/2016 CLINICAL DATA:  Low back pain with left leg pain EXAM: LUMBAR SPINE - COMPLETE 4+ VIEW COMPARISON:  Lumbar MRI 02/26/2008 FINDINGS: 5 mm anterior slip L4-L5 has progressed in the interval. Remaining alignment normal. Negative for fracture or pars defect. Facet degeneration at L4-5. Disc spaces intact. IMPRESSION: Grade 1 anterolisthesis L4-5 has progressed since 2009. No acute abnormality Electronically Signed   By: Franchot Gallo M.D.   On: 09/09/2016 18:59   Mr Brain Wo Contrast  Result Date: 09/21/2016 CLINICAL DATA:  71 y/o F; worsening back pain and lower extremity weakness/numbness. EXAM: MRI HEAD WITHOUT CONTRAST TECHNIQUE: Multiplanar, multiecho pulse sequences of the brain and surrounding structures were obtained without intravenous contrast. COMPARISON:  09/20/2016 cervical MRI.  08/01/2016 MRI of the brain. FINDINGS: Brain: Increased T2 FLAIR signal within the right paramedian frontal sulci (series 12, image 157). Within the bilateral paramedian cingulate and right frontal lobes in the region of incomplete FLAIR suppression there is cortical diffusion restriction and increased T2 FLAIR hyperintense signal (series 7 image 17 and series 5, image 26). Humerus T2 FLAIR hyperintense foci in white matter in both periventricular and subcortical white matter are similar in distribution in comparison with the prior  MRI. On the sagittal T2 FLAIR volumetric sequence there are foci of signal abnormality at the cervicomedullary junction and upper cervical cord better characterized on the prior cervical MRI. No abnormal susceptibility hypointensity. No focal mass effect. No hydrocephalus. Vascular: Normal flow voids. Skull and upper cervical spine: Normal marrow signal. Sinuses/Orbits: Right mastoid effusion. No abnormal signal of paranasal sinuses or left mastoid air cell. Bilateral intra-ocular lens replacement. Other: None. IMPRESSION: 1. Cortical diffusion and FLAIR signal abnormality with some increased sulcal FLAIR signal centered in the anterior paramedian frontal and cingulate gyri. 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 The pattern of meningeal and cortical inflammation is not typical of multiple sclerosis or neuromyelitis optica favoring an infectious or other autoimmune etiology. Consider follow-up with MRI of the brain with contrast. 2. Stable nonspecific T2 FLAIR hyperintense white matter foci may represent microvascular ischemic changes particularly in the setting of diabetes or hypertension or sequelae of demyelination, vasculitis, and other infectious/inflammatory processes. Electronically Signed   By: Kristine Garbe M.D.   On: 09/21/2016 02:57   Mr Lumbar Spine Wo Contrast  Result Date: 09/20/2016 CLINICAL DATA:  Acute on chronic low back pain radiating  to buttocks and LEFT lower extremity with numbness and tingling. On Vicodin with minimal relief. EXAM: MRI CERVICAL, THORACIC SPINE WITH AND WITHOUT CONTRAST MR LUMBAR SPINE WITHOUT TECHNIQUE: Multiplanar and multiecho pulse sequences of the cervical spine, to include the craniocervical junction and cervicothoracic junction, and thoracic with and without contrast. Multiplanar and multi echo pulse sequences of the lumbar spine, were obtained without intravenous contrast. CONTRAST:  15  cc MultiHance COMPARISON:  CT chest May 19, 2014 and MRI of lumbar spine February 26, 2008 FINDINGS: MRI CERVICAL SPINE FINDINGS ALIGNMENT: Straightened cervical lordosis.  No malalignment. VERTEBRAE/DISCS: Vertebral bodies are intact. Moderate to severe C3-4 thru C6-7 disc height loss of with moderate chronic discogenic endplate changes and disc desiccation. No suspicious bone marrow signal. No suspicious osseous or intradiscal enhancement. CORD:Expansile T2 bright signal and patchy varying enhancement predominately involving the posterior columns C2 through C4-5 and more central within the spinal cord at C5 through C7-T1. No myelomalacia. No syrinx. No abnormal leptomeningeal or epidural enhancement. POSTERIOR FOSSA, VERTEBRAL ARTERIES, PARASPINAL TISSUES: No MR findings of ligamentous injury. Vertebral artery flow voids present. Included posterior fossa and paraspinal soft tissues are normal. DISC LEVELS: C2-3: Small central disc protrusion. Mild facet arthropathy without canal stenosis or neural foraminal narrowing. C3-4: 2 mm broad-based disc bulge, uncovertebral hypertrophy and mild facet arthropathy. Mild canal stenosis. Moderate RIGHT, mild to moderate LEFT neural foraminal narrowing. C4-5: Annular bulging, uncovertebral hypertrophy and mild facet arthropathy. Mild canal stenosis. Moderate RIGHT and moderate to severe LEFT neural foraminal narrowing. C5-6: Broad-based disc bulge and LEFT central disc protrusion in total measure 3 mm in AP dimension. Moderate to severe canal stenosis, AP dimension the canal is 6 mm. Ventral cord deformity. Severe RIGHT and moderate to severe LEFT neural foraminal narrowing. C6-7: 2 mm broad-based disc bulge, uncovertebral hypertrophy and mild facet arthropathy. Moderate canal stenosis. Moderate to severe bilateral neural foraminal narrowing. C7-T1: No disc bulge, canal stenosis nor neural foraminal narrowing. MRI THORACIC SPINE FINDINGS ALIGNMENT: Maintenance of the  thoracic kyphosis. No malalignment. VERTEBRAE/DISCS: Vertebral bodies are intact. Old minimally displaced sternal fracture. Intervertebral discs morphology and signal are normal. Multilevel mild chronic discogenic endplate changes/spurring. Moderate subacute on chronic discogenic endplate changes [id number]. Heterogeneous T1, bright STIR probable hemangioma T2, 10 mm. Additional large L1 hemangioma. No suspicious osseous or intradiscal enhancement. CORD: Patchy mildly expansile central spinal cord T2 hyperintense signal and varying enhancement T1-2 through T2, T5-6 through T7-8 with predominant dorsal cord involvement. Patchy T2 hyperintense signal within the conus medullaris. No syrinx. No abnormal leptomeningeal or epidural enhancement. No myelomalacia. PREVERTEBRAL AND PARASPINAL SOFT TISSUES:  Normal. DISC LEVELS: At T5-6 is 3 mm RIGHT central disc protrusion/extrusion without migration, contacting the ventral spinal cord without canal stenosis or neural foraminal narrowing at any level. MRI LUMBAR SPINE FINDINGS SEGMENTATION: For the purposes of this report, the last well-formed intervertebral disc will be described as L5-S1. ALIGNMENT: Maintenance of the lumbar lordosis. Grade 1 L4-5 anterolisthesis is similar, no spondylolysis. VERTEBRAE:Vertebral bodies are intact. Mild L4-5 and L5-S1 disc height loss is similar with decreased T2 signal within the lower lumbar disc compatible with mild desiccation. Mild chronic discogenic endplate changes 075-GRM and L5-S1. No abnormal bone marrow signal. No suspicious bone marrow signal. Large L1 hemangioma with additional scattered small hemangiomas. CONUS MEDULLARIS: Conus medullaris terminates at L1-2. Cauda equina is normal. PARASPINAL AND SOFT TISSUES: Extremely distended urinary bladder. DISC LEVELS: L1-2: Small broad-based LEFT central disc protrusion, interval re- absorption of extrusion. No canal stenosis or neural  foraminal narrowing. L2-3: No disc bulge, canal stenosis  nor neural foraminal narrowing. L3-4: Annular bulging. Moderate facet arthropathy and ligamentum flavum redundancy with trace facet effusions which are likely reactive. No canal stenosis. Mild neural foraminal narrowing. L4-5: Anterolisthesis. Small broad-based disc bulge. Severe facet arthropathy and ligamentum flavum redundancy with trace facet effusions which are likely reactive. Mild canal stenosis including partial effacement lateral recesses which may affect the traversing L5 nerves. Moderate to severe RIGHT, moderate LEFT neural foraminal narrowing. L5-S1: Small broad-based disc bulge. Moderate to severe RIGHT, moderate LEFT facet arthropathy. Trace RIGHT facet effusion is likely reactive. No canal stenosis. Mild LEFT neural foraminal narrowing. IMPRESSION: MRI CERVICAL SPINE: Abnormal spinal cord signal and enhancement without discrete mass. 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 Recommend correlation with cerebral spinal fluid studies. Degenerative cervical spine results in moderate to severe canal stenosis C5-6, moderate at C6-7. Neural foraminal narrowing C3-4 thru C6-7: Severe on the RIGHT at C5-6, moderate to severe multiple levels. MRI THORACIC SPINE: Abnormal spinal cord signal and enhancement without discrete mass. 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 Recommend correlation with cerebral spinal fluid studies. Small T5-6 disc protrusion/extrusion without canal stenosis or neural foraminal narrowing. MRI LUMBAR SPINE: Re- absorption of L1-2 disc extrusion with small residual protrusion. Degenerative lumbar spine resulting in mild canal stenosis L4-5, similar grade 1 L4-5 anterolisthesis on degenerative basis. Neural foraminal narrowing L3-4 through L5-S1: Moderate to severe on the RIGHT at L4-5. Preliminary acute findings discussed with and reconfirmed by PA.JAIME WARD on 09/20/2016 at 5:35 pm. Electronically Signed   By: Elon Alas M.D.   On: 09/20/2016 19:19     Mr Cervical Spine W Wo Contrast  Result Date: 09/20/2016 CLINICAL DATA:  Acute on chronic low back pain radiating to buttocks and LEFT lower extremity with numbness and tingling. On Vicodin with minimal relief. EXAM: MRI CERVICAL, THORACIC SPINE WITH AND WITHOUT CONTRAST MR LUMBAR SPINE WITHOUT TECHNIQUE: Multiplanar and multiecho pulse sequences of the cervical spine, to include the craniocervical junction and cervicothoracic junction, and thoracic with and without contrast. Multiplanar and multi echo pulse sequences of the lumbar spine, were obtained without intravenous contrast. CONTRAST:  15 cc MultiHance COMPARISON:  CT chest May 19, 2014 and MRI of lumbar spine February 26, 2008 FINDINGS: MRI CERVICAL SPINE FINDINGS ALIGNMENT: Straightened cervical lordosis.  No malalignment. VERTEBRAE/DISCS: Vertebral bodies are intact. Moderate to severe C3-4 thru C6-7 disc height loss of with moderate chronic discogenic endplate changes and disc desiccation. No suspicious bone marrow signal. No suspicious osseous or intradiscal enhancement. CORD:Expansile T2 bright signal and patchy varying enhancement predominately involving the posterior columns C2 through C4-5 and more central within the spinal cord at C5 through C7-T1. No myelomalacia. No syrinx. No abnormal leptomeningeal or epidural enhancement. POSTERIOR FOSSA, VERTEBRAL ARTERIES, PARASPINAL TISSUES: No MR findings of ligamentous injury. Vertebral artery flow voids present. Included posterior fossa and paraspinal soft tissues are normal. DISC LEVELS: C2-3: Small central disc protrusion. Mild facet arthropathy without canal stenosis or neural foraminal narrowing. C3-4: 2 mm broad-based disc bulge, uncovertebral hypertrophy and mild facet arthropathy. Mild canal stenosis. Moderate RIGHT, mild to moderate LEFT neural foraminal narrowing. C4-5: Annular bulging, uncovertebral hypertrophy and mild facet arthropathy. Mild canal stenosis. Moderate RIGHT and moderate  to severe LEFT neural foraminal narrowing. C5-6: Broad-based disc bulge and LEFT central disc protrusion in total measure 3 mm in AP dimension. Moderate to severe canal stenosis, AP dimension the canal is 6 mm. Ventral cord deformity. Severe RIGHT and moderate  to severe LEFT neural foraminal narrowing. C6-7: 2 mm broad-based disc bulge, uncovertebral hypertrophy and mild facet arthropathy. Moderate canal stenosis. Moderate to severe bilateral neural foraminal narrowing. C7-T1: No disc bulge, canal stenosis nor neural foraminal narrowing. MRI THORACIC SPINE FINDINGS ALIGNMENT: Maintenance of the thoracic kyphosis. No malalignment. VERTEBRAE/DISCS: Vertebral bodies are intact. Old minimally displaced sternal fracture. Intervertebral discs morphology and signal are normal. Multilevel mild chronic discogenic endplate changes/spurring. Moderate subacute on chronic discogenic endplate changes [id number]. Heterogeneous T1, bright STIR probable hemangioma T2, 10 mm. Additional large L1 hemangioma. No suspicious osseous or intradiscal enhancement. CORD: Patchy mildly expansile central spinal cord T2 hyperintense signal and varying enhancement T1-2 through T2, T5-6 through T7-8 with predominant dorsal cord involvement. Patchy T2 hyperintense signal within the conus medullaris. No syrinx. No abnormal leptomeningeal or epidural enhancement. No myelomalacia. PREVERTEBRAL AND PARASPINAL SOFT TISSUES:  Normal. DISC LEVELS: At T5-6 is 3 mm RIGHT central disc protrusion/extrusion without migration, contacting the ventral spinal cord without canal stenosis or neural foraminal narrowing at any level. MRI LUMBAR SPINE FINDINGS SEGMENTATION: For the purposes of this report, the last well-formed intervertebral disc will be described as L5-S1. ALIGNMENT: Maintenance of the lumbar lordosis. Grade 1 L4-5 anterolisthesis is similar, no spondylolysis. VERTEBRAE:Vertebral bodies are intact. Mild L4-5 and L5-S1 disc height loss is similar with  decreased T2 signal within the lower lumbar disc compatible with mild desiccation. Mild chronic discogenic endplate changes 075-GRM and L5-S1. No abnormal bone marrow signal. No suspicious bone marrow signal. Large L1 hemangioma with additional scattered small hemangiomas. CONUS MEDULLARIS: Conus medullaris terminates at L1-2. Cauda equina is normal. PARASPINAL AND SOFT TISSUES: Extremely distended urinary bladder. DISC LEVELS: L1-2: Small broad-based LEFT central disc protrusion, interval re- absorption of extrusion. No canal stenosis or neural foraminal narrowing. L2-3: No disc bulge, canal stenosis nor neural foraminal narrowing. L3-4: Annular bulging. Moderate facet arthropathy and ligamentum flavum redundancy with trace facet effusions which are likely reactive. No canal stenosis. Mild neural foraminal narrowing. L4-5: Anterolisthesis. Small broad-based disc bulge. Severe facet arthropathy and ligamentum flavum redundancy with trace facet effusions which are likely reactive. Mild canal stenosis including partial effacement lateral recesses which may affect the traversing L5 nerves. Moderate to severe RIGHT, moderate LEFT neural foraminal narrowing. L5-S1: Small broad-based disc bulge. Moderate to severe RIGHT, moderate LEFT facet arthropathy. Trace RIGHT facet effusion is likely reactive. No canal stenosis. Mild LEFT neural foraminal narrowing. IMPRESSION: MRI CERVICAL SPINE: Abnormal spinal cord signal and enhancement without discrete mass. 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 Recommend correlation with cerebral spinal fluid studies. Degenerative cervical spine results in moderate to severe canal stenosis C5-6, moderate at C6-7. Neural foraminal narrowing C3-4 thru C6-7: Severe on the RIGHT at C5-6, moderate to severe multiple levels. MRI THORACIC SPINE: Abnormal spinal cord signal and enhancement without discrete mass. 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 Recommend  correlation with cerebral spinal fluid studies. Small T5-6 disc protrusion/extrusion without canal stenosis or neural foraminal narrowing. MRI LUMBAR SPINE: Re- absorption of L1-2 disc extrusion with small residual protrusion. Degenerative lumbar spine resulting in mild canal stenosis L4-5, similar grade 1 L4-5 anterolisthesis on degenerative basis. Neural foraminal narrowing L3-4 through L5-S1: Moderate to severe on the RIGHT at L4-5. Preliminary acute findings discussed with and reconfirmed by PA.JAIME WARD on 09/20/2016 at 5:35 pm. Electronically Signed   By: Elon Alas M.D.   On: 09/20/2016 19:19   Mr Thoracic Spine W Wo Contrast  Result Date: 09/20/2016 CLINICAL DATA:  Acute on chronic  low back pain radiating to buttocks and LEFT lower extremity with numbness and tingling. On Vicodin with minimal relief. EXAM: MRI CERVICAL, THORACIC SPINE WITH AND WITHOUT CONTRAST MR LUMBAR SPINE WITHOUT TECHNIQUE: Multiplanar and multiecho pulse sequences of the cervical spine, to include the craniocervical junction and cervicothoracic junction, and thoracic with and without contrast. Multiplanar and multi echo pulse sequences of the lumbar spine, were obtained without intravenous contrast. CONTRAST:  15 cc MultiHance COMPARISON:  CT chest May 19, 2014 and MRI of lumbar spine February 26, 2008 FINDINGS: MRI CERVICAL SPINE FINDINGS ALIGNMENT: Straightened cervical lordosis.  No malalignment. VERTEBRAE/DISCS: Vertebral bodies are intact. Moderate to severe C3-4 thru C6-7 disc height loss of with moderate chronic discogenic endplate changes and disc desiccation. No suspicious bone marrow signal. No suspicious osseous or intradiscal enhancement. CORD:Expansile T2 bright signal and patchy varying enhancement predominately involving the posterior columns C2 through C4-5 and more central within the spinal cord at C5 through C7-T1. No myelomalacia. No syrinx. No abnormal leptomeningeal or epidural enhancement. POSTERIOR  FOSSA, VERTEBRAL ARTERIES, PARASPINAL TISSUES: No MR findings of ligamentous injury. Vertebral artery flow voids present. Included posterior fossa and paraspinal soft tissues are normal. DISC LEVELS: C2-3: Small central disc protrusion. Mild facet arthropathy without canal stenosis or neural foraminal narrowing. C3-4: 2 mm broad-based disc bulge, uncovertebral hypertrophy and mild facet arthropathy. Mild canal stenosis. Moderate RIGHT, mild to moderate LEFT neural foraminal narrowing. C4-5: Annular bulging, uncovertebral hypertrophy and mild facet arthropathy. Mild canal stenosis. Moderate RIGHT and moderate to severe LEFT neural foraminal narrowing. C5-6: Broad-based disc bulge and LEFT central disc protrusion in total measure 3 mm in AP dimension. Moderate to severe canal stenosis, AP dimension the canal is 6 mm. Ventral cord deformity. Severe RIGHT and moderate to severe LEFT neural foraminal narrowing. C6-7: 2 mm broad-based disc bulge, uncovertebral hypertrophy and mild facet arthropathy. Moderate canal stenosis. Moderate to severe bilateral neural foraminal narrowing. C7-T1: No disc bulge, canal stenosis nor neural foraminal narrowing. MRI THORACIC SPINE FINDINGS ALIGNMENT: Maintenance of the thoracic kyphosis. No malalignment. VERTEBRAE/DISCS: Vertebral bodies are intact. Old minimally displaced sternal fracture. Intervertebral discs morphology and signal are normal. Multilevel mild chronic discogenic endplate changes/spurring. Moderate subacute on chronic discogenic endplate changes [id number]. Heterogeneous T1, bright STIR probable hemangioma T2, 10 mm. Additional large L1 hemangioma. No suspicious osseous or intradiscal enhancement. CORD: Patchy mildly expansile central spinal cord T2 hyperintense signal and varying enhancement T1-2 through T2, T5-6 through T7-8 with predominant dorsal cord involvement. Patchy T2 hyperintense signal within the conus medullaris. No syrinx. No abnormal leptomeningeal or epidural  enhancement. No myelomalacia. PREVERTEBRAL AND PARASPINAL SOFT TISSUES:  Normal. DISC LEVELS: At T5-6 is 3 mm RIGHT central disc protrusion/extrusion without migration, contacting the ventral spinal cord without canal stenosis or neural foraminal narrowing at any level. MRI LUMBAR SPINE FINDINGS SEGMENTATION: For the purposes of this report, the last well-formed intervertebral disc will be described as L5-S1. ALIGNMENT: Maintenance of the lumbar lordosis. Grade 1 L4-5 anterolisthesis is similar, no spondylolysis. VERTEBRAE:Vertebral bodies are intact. Mild L4-5 and L5-S1 disc height loss is similar with decreased T2 signal within the lower lumbar disc compatible with mild desiccation. Mild chronic discogenic endplate changes [id number] and L5-S1. No abnormal bone marrow signal. No suspicious bone marrow signal. Large L1 hemangioma with additional scattered small hemangiomas. CONUS MEDULLARIS: Conus medullaris terminates at L1-2. Cauda equina is normal. PARASPINAL AND SOFT TISSUES: Extremely distended urinary bladder. DISC LEVELS: L1-2: Small broad-based LEFT central disc protrusion, interval re- absorption of extrusion. No  canal stenosis or neural foraminal narrowing. L2-3: No disc bulge, canal stenosis nor neural foraminal narrowing. L3-4: Annular bulging. Moderate facet arthropathy and ligamentum flavum redundancy with trace facet effusions which are likely reactive. No canal stenosis. Mild neural foraminal narrowing. L4-5: Anterolisthesis. Small broad-based disc bulge. Severe facet arthropathy and ligamentum flavum redundancy with trace facet effusions which are likely reactive. Mild canal stenosis including partial effacement lateral recesses which may affect the traversing L5 nerves. Moderate to severe RIGHT, moderate LEFT neural foraminal narrowing. L5-S1: Small broad-based disc bulge. Moderate to severe RIGHT, moderate LEFT facet arthropathy. Trace RIGHT facet effusion is likely reactive. No canal stenosis. Mild  LEFT neural foraminal narrowing. IMPRESSION: MRI CERVICAL SPINE: Abnormal spinal cord signal and enhancement without discrete mass. 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 Recommend correlation with cerebral spinal fluid studies. Degenerative cervical spine results in moderate to severe canal stenosis C5-6, moderate at C6-7. Neural foraminal narrowing C3-4 thru C6-7: Severe on the RIGHT at C5-6, moderate to severe multiple levels. MRI THORACIC SPINE: Abnormal spinal cord signal and enhancement without discrete mass. 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 Recommend correlation with cerebral spinal fluid studies. Small T5-6 disc protrusion/extrusion without canal stenosis or neural foraminal narrowing. MRI LUMBAR SPINE: Re- absorption of L1-2 disc extrusion with small residual protrusion. Degenerative lumbar spine resulting in mild canal stenosis L4-5, similar grade 1 L4-5 anterolisthesis on degenerative basis. Neural foraminal narrowing L3-4 through L5-S1: Moderate to severe on the RIGHT at L4-5. Preliminary acute findings discussed with and reconfirmed by [email or web address] WARD on 09/20/2016 at 5:35 pm. Electronically Signed   By: Elon Alas M.D.   On: 09/20/2016 19:19    Time Spent in minutes  30   Jani Gravel M.D on 09/22/2016 at 8:28 AM  Between 7am to 7pm - Pager - [phone]  After 7pm go to [email or web address] - password [facility]  [facility] -  Office  [phone]

## 2016-09-22 NOTE — Progress Notes (Signed)
Subjective: Interval History: She is c/o numbness and tingling in all 4 extremities.  Her weakness has dissipated mostly.  Foley is in place.  No pain on the skin with or without rashes.  Objective: Vital signs in last 24 hours: Temp:  [97.9 F (36.6 C)-98.2 F (36.8 C)] 98.2 F (36.8 C) (01/20 0601) Pulse Rate:  [78-85] 78 (01/20 0601) Resp:  [17-20] 20 (01/20 0601) BP: (118-131)/(68-82) 131/79 (01/20 0601) SpO2:  [95 %-96 %] 96 % (01/20 0601)  Intake/Output from previous day: 01/19 0701 - 01/20 0700 In: 2735 [P.O.:600; I.V.:1575; IV Piggyback:560] Out: 2900 [Urine:2900] Intake/Output this shift: No intake/output data recorded. Nutritional status: Diet regular Room service appropriate? Yes; Fluid consistency: Thin  Neurologic Exam: Awake, alert, fully oriented. PERL, EOMI, Face symmetrical, tongue midline. Language is normal. Strength 5/5 bil UE and LE except for 4/5 left grip. No pronator drift. There is a vesicular rash in the left lateral thorac and left inner thigh and left groin areas.   Sensation is decreased more on the left vs right in UE, but normal in the LE.   No babinski.  No hoffman's. Gait - independent, but mildly unsteady.     Lab Results:  Recent Labs  09/20/16 1420 09/21/16 0655 09/22/16 0435  WBC 10.9* 8.8  --   HGB 15.8* 14.7  --   HCT 47.4* 43.5  --   PLT 272 221  --   NA 134* 131* 134*  K 3.9 4.3 4.6  CL 96* 99* 104  CO2 23 20* 18*  GLUCOSE 70 117* 144*  BUN 24* 19 15  CREATININE 1.00 0.84 0.70  CALCIUM 10.0 9.0 8.8*   Lipid Panel No results for input(s): CHOL, TRIG, HDL, CHOLHDL, VLDL, LDLCALC in the last 72 hours.  Studies/Results: Mr Brain Wo Contrast  Result Date: 09/21/2016 CLINICAL DATA:  71 y/o F; worsening back pain and lower extremity weakness/numbness. EXAM: MRI HEAD WITHOUT CONTRAST TECHNIQUE: Multiplanar, multiecho pulse sequences of the brain and surrounding structures were obtained without intravenous contrast.  COMPARISON:  09/20/2016 cervical MRI.  08/01/2016 MRI of the brain. FINDINGS: Brain: Increased T2 FLAIR signal within the right paramedian frontal sulci (series 12, image 157). Within the bilateral paramedian cingulate and right frontal lobes in the region of incomplete FLAIR suppression there is cortical diffusion restriction and increased T2 FLAIR hyperintense signal (series 7 image 17 and series 5, image 26). Humerus T2 FLAIR hyperintense foci in white matter in both periventricular and subcortical white matter are similar in distribution in comparison with the prior MRI. On the sagittal T2 FLAIR volumetric sequence there are foci of signal abnormality at the cervicomedullary junction and upper cervical cord better characterized on the prior cervical MRI. No abnormal susceptibility hypointensity. No focal mass effect. No hydrocephalus. Vascular: Normal flow voids. Skull and upper cervical spine: Normal marrow signal. Sinuses/Orbits: Right mastoid effusion. No abnormal signal of paranasal sinuses or left mastoid air cell. Bilateral intra-ocular lens replacement. Other: None. IMPRESSION: 1. Cortical diffusion and FLAIR signal abnormality with some increased sulcal FLAIR signal centered in the anterior paramedian frontal and cingulate gyri. 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 The pattern of meningeal and cortical inflammation is not typical of multiple sclerosis or neuromyelitis optica favoring an infectious or other autoimmune etiology. Consider follow-up with MRI of the brain with contrast. 2. Stable nonspecific T2 FLAIR hyperintense white matter foci may represent microvascular ischemic changes particularly in the setting of diabetes or hypertension or  sequelae of demyelination, vasculitis, and other infectious/inflammatory processes. Electronically Signed   By: Kristine Garbe M.D.   On: 09/21/2016 02:57   Mr Lumbar Spine Wo  Contrast  Result Date: 09/20/2016 CLINICAL DATA:  Acute on chronic low back pain radiating to buttocks and LEFT lower extremity with numbness and tingling. On Vicodin with minimal relief. EXAM: MRI CERVICAL, THORACIC SPINE WITH AND WITHOUT CONTRAST MR LUMBAR SPINE WITHOUT TECHNIQUE: Multiplanar and multiecho pulse sequences of the cervical spine, to include the craniocervical junction and cervicothoracic junction, and thoracic with and without contrast. Multiplanar and multi echo pulse sequences of the lumbar spine, were obtained without intravenous contrast. CONTRAST:  15 cc MultiHance COMPARISON:  CT chest May 19, 2014 and MRI of lumbar spine February 26, 2008 FINDINGS: MRI CERVICAL SPINE FINDINGS ALIGNMENT: Straightened cervical lordosis.  No malalignment. VERTEBRAE/DISCS: Vertebral bodies are intact. Moderate to severe C3-4 thru C6-7 disc height loss of with moderate chronic discogenic endplate changes and disc desiccation. No suspicious bone marrow signal. No suspicious osseous or intradiscal enhancement. CORD:Expansile T2 bright signal and patchy varying enhancement predominately involving the posterior columns C2 through C4-5 and more central within the spinal cord at C5 through C7-T1. No myelomalacia. No syrinx. No abnormal leptomeningeal or epidural enhancement. POSTERIOR FOSSA, VERTEBRAL ARTERIES, PARASPINAL TISSUES: No MR findings of ligamentous injury. Vertebral artery flow voids present. Included posterior fossa and paraspinal soft tissues are normal. DISC LEVELS: C2-3: Small central disc protrusion. Mild facet arthropathy without canal stenosis or neural foraminal narrowing. C3-4: 2 mm broad-based disc bulge, uncovertebral hypertrophy and mild facet arthropathy. Mild canal stenosis. Moderate RIGHT, mild to moderate LEFT neural foraminal narrowing. C4-5: Annular bulging, uncovertebral hypertrophy and mild facet arthropathy. Mild canal stenosis. Moderate RIGHT and moderate to severe LEFT neural  foraminal narrowing. C5-6: Broad-based disc bulge and LEFT central disc protrusion in total measure 3 mm in AP dimension. Moderate to severe canal stenosis, AP dimension the canal is 6 mm. Ventral cord deformity. Severe RIGHT and moderate to severe LEFT neural foraminal narrowing. C6-7: 2 mm broad-based disc bulge, uncovertebral hypertrophy and mild facet arthropathy. Moderate canal stenosis. Moderate to severe bilateral neural foraminal narrowing. C7-T1: No disc bulge, canal stenosis nor neural foraminal narrowing. MRI THORACIC SPINE FINDINGS ALIGNMENT: Maintenance of the thoracic kyphosis. No malalignment. VERTEBRAE/DISCS: Vertebral bodies are intact. Old minimally displaced sternal fracture. Intervertebral discs morphology and signal are normal. Multilevel mild chronic discogenic endplate changes/spurring. Moderate subacute on chronic discogenic endplate changes [id number]. Heterogeneous T1, bright STIR probable hemangioma T2, 10 mm. Additional large L1 hemangioma. No suspicious osseous or intradiscal enhancement. CORD: Patchy mildly expansile central spinal cord T2 hyperintense signal and varying enhancement T1-2 through T2, T5-6 through T7-8 with predominant dorsal cord involvement. Patchy T2 hyperintense signal within the conus medullaris. No syrinx. No abnormal leptomeningeal or epidural enhancement. No myelomalacia. PREVERTEBRAL AND PARASPINAL SOFT TISSUES:  Normal. DISC LEVELS: At T5-6 is 3 mm RIGHT central disc protrusion/extrusion without migration, contacting the ventral spinal cord without canal stenosis or neural foraminal narrowing at any level. MRI LUMBAR SPINE FINDINGS SEGMENTATION: For the purposes of this report, the last well-formed intervertebral disc will be described as L5-S1. ALIGNMENT: Maintenance of the lumbar lordosis. Grade 1 L4-5 anterolisthesis is similar, no spondylolysis. VERTEBRAE:Vertebral bodies are intact. Mild L4-5 and L5-S1 disc height loss is similar with decreased T2 signal within  the lower lumbar disc compatible with mild desiccation. Mild chronic discogenic endplate changes 075-GRM and L5-S1. No abnormal bone marrow signal. No suspicious bone marrow signal. Large L1  hemangioma with additional scattered small hemangiomas. CONUS MEDULLARIS: Conus medullaris terminates at L1-2. Cauda equina is normal. PARASPINAL AND SOFT TISSUES: Extremely distended urinary bladder. DISC LEVELS: L1-2: Small broad-based LEFT central disc protrusion, interval re- absorption of extrusion. No canal stenosis or neural foraminal narrowing. L2-3: No disc bulge, canal stenosis nor neural foraminal narrowing. L3-4: Annular bulging. Moderate facet arthropathy and ligamentum flavum redundancy with trace facet effusions which are likely reactive. No canal stenosis. Mild neural foraminal narrowing. L4-5: Anterolisthesis. Small broad-based disc bulge. Severe facet arthropathy and ligamentum flavum redundancy with trace facet effusions which are likely reactive. Mild canal stenosis including partial effacement lateral recesses which may affect the traversing L5 nerves. Moderate to severe RIGHT, moderate LEFT neural foraminal narrowing. L5-S1: Small broad-based disc bulge. Moderate to severe RIGHT, moderate LEFT facet arthropathy. Trace RIGHT facet effusion is likely reactive. No canal stenosis. Mild LEFT neural foraminal narrowing. IMPRESSION: MRI CERVICAL SPINE: Abnormal spinal cord signal and enhancement without discrete mass. 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 Recommend correlation with cerebral spinal fluid studies. Degenerative cervical spine results in moderate to severe canal stenosis C5-6, moderate at C6-7. Neural foraminal narrowing C3-4 thru C6-7: Severe on the RIGHT at C5-6, moderate to severe multiple levels. MRI THORACIC SPINE: Abnormal spinal cord signal and enhancement without discrete mass. 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 Recommend correlation with cerebral spinal  fluid studies. Small T5-6 disc protrusion/extrusion without canal stenosis or neural foraminal narrowing. MRI LUMBAR SPINE: Re- absorption of L1-2 disc extrusion with small residual protrusion. Degenerative lumbar spine resulting in mild canal stenosis L4-5, similar grade 1 L4-5 anterolisthesis on degenerative basis. Neural foraminal narrowing L3-4 through L5-S1: Moderate to severe on the RIGHT at L4-5. Preliminary acute findings discussed with and reconfirmed by PA.JAIME WARD on 09/20/2016 at 5:35 pm. Electronically Signed   By: Elon Alas M.D.   On: 09/20/2016 19:19   Mr Cervical Spine W Wo Contrast  Result Date: 09/20/2016 CLINICAL DATA:  Acute on chronic low back pain radiating to buttocks and LEFT lower extremity with numbness and tingling. On Vicodin with minimal relief. EXAM: MRI CERVICAL, THORACIC SPINE WITH AND WITHOUT CONTRAST MR LUMBAR SPINE WITHOUT TECHNIQUE: Multiplanar and multiecho pulse sequences of the cervical spine, to include the craniocervical junction and cervicothoracic junction, and thoracic with and without contrast. Multiplanar and multi echo pulse sequences of the lumbar spine, were obtained without intravenous contrast. CONTRAST:  15 cc MultiHance COMPARISON:  CT chest May 19, 2014 and MRI of lumbar spine February 26, 2008 FINDINGS: MRI CERVICAL SPINE FINDINGS ALIGNMENT: Straightened cervical lordosis.  No malalignment. VERTEBRAE/DISCS: Vertebral bodies are intact. Moderate to severe C3-4 thru C6-7 disc height loss of with moderate chronic discogenic endplate changes and disc desiccation. No suspicious bone marrow signal. No suspicious osseous or intradiscal enhancement. CORD:Expansile T2 bright signal and patchy varying enhancement predominately involving the posterior columns C2 through C4-5 and more central within the spinal cord at C5 through C7-T1. No myelomalacia. No syrinx. No abnormal leptomeningeal or epidural enhancement. POSTERIOR FOSSA, VERTEBRAL ARTERIES,  PARASPINAL TISSUES: No MR findings of ligamentous injury. Vertebral artery flow voids present. Included posterior fossa and paraspinal soft tissues are normal. DISC LEVELS: C2-3: Small central disc protrusion. Mild facet arthropathy without canal stenosis or neural foraminal narrowing. C3-4: 2 mm broad-based disc bulge, uncovertebral hypertrophy and mild facet arthropathy. Mild canal stenosis. Moderate RIGHT, mild to moderate LEFT neural foraminal narrowing. C4-5: Annular bulging, uncovertebral hypertrophy and mild facet arthropathy. Mild canal stenosis. Moderate RIGHT and moderate  to severe LEFT neural foraminal narrowing. C5-6: Broad-based disc bulge and LEFT central disc protrusion in total measure 3 mm in AP dimension. Moderate to severe canal stenosis, AP dimension the canal is 6 mm. Ventral cord deformity. Severe RIGHT and moderate to severe LEFT neural foraminal narrowing. C6-7: 2 mm broad-based disc bulge, uncovertebral hypertrophy and mild facet arthropathy. Moderate canal stenosis. Moderate to severe bilateral neural foraminal narrowing. C7-T1: No disc bulge, canal stenosis nor neural foraminal narrowing. MRI THORACIC SPINE FINDINGS ALIGNMENT: Maintenance of the thoracic kyphosis. No malalignment. VERTEBRAE/DISCS: Vertebral bodies are intact. Old minimally displaced sternal fracture. Intervertebral discs morphology and signal are normal. Multilevel mild chronic discogenic endplate changes/spurring. Moderate subacute on chronic discogenic endplate changes [id number]. Heterogeneous T1, bright STIR probable hemangioma T2, 10 mm. Additional large L1 hemangioma. No suspicious osseous or intradiscal enhancement. CORD: Patchy mildly expansile central spinal cord T2 hyperintense signal and varying enhancement T1-2 through T2, T5-6 through T7-8 with predominant dorsal cord involvement. Patchy T2 hyperintense signal within the conus medullaris. No syrinx. No abnormal leptomeningeal or epidural enhancement. No  myelomalacia. PREVERTEBRAL AND PARASPINAL SOFT TISSUES:  Normal. DISC LEVELS: At T5-6 is 3 mm RIGHT central disc protrusion/extrusion without migration, contacting the ventral spinal cord without canal stenosis or neural foraminal narrowing at any level. MRI LUMBAR SPINE FINDINGS SEGMENTATION: For the purposes of this report, the last well-formed intervertebral disc will be described as L5-S1. ALIGNMENT: Maintenance of the lumbar lordosis. Grade 1 L4-5 anterolisthesis is similar, no spondylolysis. VERTEBRAE:Vertebral bodies are intact. Mild L4-5 and L5-S1 disc height loss is similar with decreased T2 signal within the lower lumbar disc compatible with mild desiccation. Mild chronic discogenic endplate changes 075-GRM and L5-S1. No abnormal bone marrow signal. No suspicious bone marrow signal. Large L1 hemangioma with additional scattered small hemangiomas. CONUS MEDULLARIS: Conus medullaris terminates at L1-2. Cauda equina is normal. PARASPINAL AND SOFT TISSUES: Extremely distended urinary bladder. DISC LEVELS: L1-2: Small broad-based LEFT central disc protrusion, interval re- absorption of extrusion. No canal stenosis or neural foraminal narrowing. L2-3: No disc bulge, canal stenosis nor neural foraminal narrowing. L3-4: Annular bulging. Moderate facet arthropathy and ligamentum flavum redundancy with trace facet effusions which are likely reactive. No canal stenosis. Mild neural foraminal narrowing. L4-5: Anterolisthesis. Small broad-based disc bulge. Severe facet arthropathy and ligamentum flavum redundancy with trace facet effusions which are likely reactive. Mild canal stenosis including partial effacement lateral recesses which may affect the traversing L5 nerves. Moderate to severe RIGHT, moderate LEFT neural foraminal narrowing. L5-S1: Small broad-based disc bulge. Moderate to severe RIGHT, moderate LEFT facet arthropathy. Trace RIGHT facet effusion is likely reactive. No canal stenosis. Mild LEFT neural  foraminal narrowing. IMPRESSION: MRI CERVICAL SPINE: Abnormal spinal cord signal and enhancement without discrete mass. 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 Recommend correlation with cerebral spinal fluid studies. Degenerative cervical spine results in moderate to severe canal stenosis C5-6, moderate at C6-7. Neural foraminal narrowing C3-4 thru C6-7: Severe on the RIGHT at C5-6, moderate to severe multiple levels. MRI THORACIC SPINE: Abnormal spinal cord signal and enhancement without discrete mass. 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 Recommend correlation with cerebral spinal fluid studies. Small T5-6 disc protrusion/extrusion without canal stenosis or neural foraminal narrowing. MRI LUMBAR SPINE: Re- absorption of L1-2 disc extrusion with small residual protrusion. Degenerative lumbar spine resulting in mild canal stenosis L4-5, similar grade 1 L4-5 anterolisthesis on degenerative basis. Neural foraminal narrowing L3-4 through L5-S1: Moderate to severe on the RIGHT at L4-5. Preliminary acute findings discussed with  and reconfirmed by PA.JAIME WARD on 09/20/2016 at 5:35 pm. Electronically Signed   By: Elon Alas M.D.   On: 09/20/2016 19:19   Mr Thoracic Spine W Wo Contrast  Result Date: 09/20/2016 CLINICAL DATA:  Acute on chronic low back pain radiating to buttocks and LEFT lower extremity with numbness and tingling. On Vicodin with minimal relief. EXAM: MRI CERVICAL, THORACIC SPINE WITH AND WITHOUT CONTRAST MR LUMBAR SPINE WITHOUT TECHNIQUE: Multiplanar and multiecho pulse sequences of the cervical spine, to include the craniocervical junction and cervicothoracic junction, and thoracic with and without contrast. Multiplanar and multi echo pulse sequences of the lumbar spine, were obtained without intravenous contrast. CONTRAST:  15 cc MultiHance COMPARISON:  CT chest May 19, 2014 and MRI of lumbar spine February 26, 2008 FINDINGS: MRI CERVICAL SPINE FINDINGS  ALIGNMENT: Straightened cervical lordosis.  No malalignment. VERTEBRAE/DISCS: Vertebral bodies are intact. Moderate to severe C3-4 thru C6-7 disc height loss of with moderate chronic discogenic endplate changes and disc desiccation. No suspicious bone marrow signal. No suspicious osseous or intradiscal enhancement. CORD:Expansile T2 bright signal and patchy varying enhancement predominately involving the posterior columns C2 through C4-5 and more central within the spinal cord at C5 through C7-T1. No myelomalacia. No syrinx. No abnormal leptomeningeal or epidural enhancement. POSTERIOR FOSSA, VERTEBRAL ARTERIES, PARASPINAL TISSUES: No MR findings of ligamentous injury. Vertebral artery flow voids present. Included posterior fossa and paraspinal soft tissues are normal. DISC LEVELS: C2-3: Small central disc protrusion. Mild facet arthropathy without canal stenosis or neural foraminal narrowing. C3-4: 2 mm broad-based disc bulge, uncovertebral hypertrophy and mild facet arthropathy. Mild canal stenosis. Moderate RIGHT, mild to moderate LEFT neural foraminal narrowing. C4-5: Annular bulging, uncovertebral hypertrophy and mild facet arthropathy. Mild canal stenosis. Moderate RIGHT and moderate to severe LEFT neural foraminal narrowing. C5-6: Broad-based disc bulge and LEFT central disc protrusion in total measure 3 mm in AP dimension. Moderate to severe canal stenosis, AP dimension the canal is 6 mm. Ventral cord deformity. Severe RIGHT and moderate to severe LEFT neural foraminal narrowing. C6-7: 2 mm broad-based disc bulge, uncovertebral hypertrophy and mild facet arthropathy. Moderate canal stenosis. Moderate to severe bilateral neural foraminal narrowing. C7-T1: No disc bulge, canal stenosis nor neural foraminal narrowing. MRI THORACIC SPINE FINDINGS ALIGNMENT: Maintenance of the thoracic kyphosis. No malalignment. VERTEBRAE/DISCS: Vertebral bodies are intact. Old minimally displaced sternal fracture. Intervertebral  discs morphology and signal are normal. Multilevel mild chronic discogenic endplate changes/spurring. Moderate subacute on chronic discogenic endplate changes [id number]. Heterogeneous T1, bright STIR probable hemangioma T2, 10 mm. Additional large L1 hemangioma. No suspicious osseous or intradiscal enhancement. CORD: Patchy mildly expansile central spinal cord T2 hyperintense signal and varying enhancement T1-2 through T2, T5-6 through T7-8 with predominant dorsal cord involvement. Patchy T2 hyperintense signal within the conus medullaris. No syrinx. No abnormal leptomeningeal or epidural enhancement. No myelomalacia. PREVERTEBRAL AND PARASPINAL SOFT TISSUES:  Normal. DISC LEVELS: At T5-6 is 3 mm RIGHT central disc protrusion/extrusion without migration, contacting the ventral spinal cord without canal stenosis or neural foraminal narrowing at any level. MRI LUMBAR SPINE FINDINGS SEGMENTATION: For the purposes of this report, the last well-formed intervertebral disc will be described as L5-S1. ALIGNMENT: Maintenance of the lumbar lordosis. Grade 1 L4-5 anterolisthesis is similar, no spondylolysis. VERTEBRAE:Vertebral bodies are intact. Mild L4-5 and L5-S1 disc height loss is similar with decreased T2 signal within the lower lumbar disc compatible with mild desiccation. Mild chronic discogenic endplate changes [id number]-GRM and L5-S1. No abnormal bone marrow signal. No suspicious bone marrow signal.  Large L1 hemangioma with additional scattered small hemangiomas. CONUS MEDULLARIS: Conus medullaris terminates at L1-2. Cauda equina is normal. PARASPINAL AND SOFT TISSUES: Extremely distended urinary bladder. DISC LEVELS: L1-2: Small broad-based LEFT central disc protrusion, interval re- absorption of extrusion. No canal stenosis or neural foraminal narrowing. L2-3: No disc bulge, canal stenosis nor neural foraminal narrowing. L3-4: Annular bulging. Moderate facet arthropathy and ligamentum flavum redundancy with trace facet  effusions which are likely reactive. No canal stenosis. Mild neural foraminal narrowing. L4-5: Anterolisthesis. Small broad-based disc bulge. Severe facet arthropathy and ligamentum flavum redundancy with trace facet effusions which are likely reactive. Mild canal stenosis including partial effacement lateral recesses which may affect the traversing L5 nerves. Moderate to severe RIGHT, moderate LEFT neural foraminal narrowing. L5-S1: Small broad-based disc bulge. Moderate to severe RIGHT, moderate LEFT facet arthropathy. Trace RIGHT facet effusion is likely reactive. No canal stenosis. Mild LEFT neural foraminal narrowing. IMPRESSION: MRI CERVICAL SPINE: Abnormal spinal cord signal and enhancement without discrete mass. 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 Recommend correlation with cerebral spinal fluid studies. Degenerative cervical spine results in moderate to severe canal stenosis C5-6, moderate at C6-7. Neural foraminal narrowing C3-4 thru C6-7: Severe on the RIGHT at C5-6, moderate to severe multiple levels. MRI THORACIC SPINE: Abnormal spinal cord signal and enhancement without discrete mass. 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 Recommend correlation with cerebral spinal fluid studies. Small T5-6 disc protrusion/extrusion without canal stenosis or neural foraminal narrowing. MRI LUMBAR SPINE: Re- absorption of L1-2 disc extrusion with small residual protrusion. Degenerative lumbar spine resulting in mild canal stenosis L4-5, similar grade 1 L4-5 anterolisthesis on degenerative basis. Neural foraminal narrowing L3-4 through L5-S1: Moderate to severe on the RIGHT at L4-5. Preliminary acute findings discussed with and reconfirmed by PA.JAIME WARD on 09/20/2016 at 5:35 pm. Electronically Signed   By: Elon Alas M.D.   On: 09/20/2016 19:19    Medications:  Scheduled: . acyclovir  10 mg/kg Intravenous Q8H  . calcium carbonate  1 tablet Oral Q breakfast  .  cholecalciferol  1,000 Units Oral Daily  . diazepam  5 mg Oral BID  . gabapentin  100 mg Oral TID  . 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  Awaiting LP under fluoro.  I added VZV PCR in the CSF.  Prior order was for HSV PCR.    She is getting Acyclovir for antiviral treatment and steroids to reduce inflammation in the spinal cord.  These seem to have shown some good benefit clinically.    She will require extensive PT/OT.  She may slowly recover all of her neurological function.     LOS: 1 day   Rogue Jury, MD 09/22/2016  10:34 AM

## 2016-09-22 NOTE — Progress Notes (Signed)
Physical Therapy Treatment Patient Details Name: TANAYA GOGAN MRN: [id number] DOB: 05-02-1946 Today's Date: 09/22/2016    History of Present Illness Pt adm with progressive lower extremity weakness and incontinence. Pt found to have subacute progressive myelopathy with enhancing lesions most consistent with myelitis. Further work up in progress. Pt also with shingles. PMH - anxiety, back pain.    PT Comments    Pt very pleasant and willing to participate in PT session.  Making progress towards goals as she increased gt distance as well as required less assistance for bed mobility and transfers.  Cont with current POC to maximize strength and safety prior to d/c home.      Follow Up Recommendations  Home health PT;Supervision for mobility/OOB     Equipment Recommendations  Rolling walker with 5" wheels    Recommendations for Other Services       Precautions / Restrictions Precautions Precautions: Fall Restrictions Weight Bearing Restrictions: No    Mobility  Bed Mobility Overal bed mobility: Needs Assistance Bed Mobility: Supine to Sit     Supine to sit: Supervision     General bed mobility comments: completed with supervision with HOB elevated and use of rail.    Transfers Overall transfer level: Needs assistance Equipment used: Rolling walker (2 wheeled) Transfers: Sit to/from [facility] Sit to Stand: Min guard Stand pivot transfers: Min guard       General transfer comment: guarding for safety due to pt stating her legs felt numb   Ambulation/Gait Ambulation/Gait assistance: Min guard Ambulation Distance (Feet): 60 Feet Assistive device: Rolling walker (2 wheeled) Gait Pattern/deviations: Step-through pattern;Decreased step length - right;Decreased step length - left;Ataxic     General Gait Details: guarding for safety due to balance and strength deficits.  Slightly ataxic gt.     Stairs            Wheelchair Mobility     Modified Rankin (Stroke Patients Only)       Balance                                    Cognition Arousal/Alertness: Awake/alert Behavior During Therapy: WFL for tasks assessed/performed;Anxious Overall Cognitive Status: Within Functional Limits for tasks assessed                      Exercises      General Comments        Pertinent Vitals/Pain      Home Living Family/patient expects to be discharged to:: Private residence Living Arrangements: Spouse/significant other                  Prior Function            PT Goals (current goals can now be found in the care plan section) Acute Rehab PT Goals PT Goal Formulation: With patient Time For Goal Achievement: 09/28/16 Potential to Achieve Goals: Good Progress towards PT goals: Progressing toward goals    Frequency    Min 3X/week      PT Plan Current plan remains appropriate    Co-evaluation             End of Session Equipment Utilized During Treatment: Gait belt Activity Tolerance: Patient tolerated treatment well Patient left: in chair;with call bell/phone within reach;with family/visitor present;Other (comment) (Neuro MD present)     Time: [id number] PT Time Calculation (min) (ACUTE ONLY): 20 min  Charges:  $Gait Training: 8-22 mins                    G Codes:      Sena Hitch 09/22/2016, 10:28 AM   Sarajane Marek, PTA [phone] 09/22/2016

## 2016-09-22 NOTE — Progress Notes (Signed)
CRITICAL VALUE ALERT  Critical value received:  CSF; WBC 490  Date of notification:  09/22/16  Time of notification:  2000  Critical value read back:Yes.    Nurse who received alert: S. Elizebeth Brooking  MD notified (1st page):  Olevia Bowens  Time of first page:  2015  MD notified (2nd page):  Time of second page:  Responding MD: Olevia Bowens  Time MD responded:  2025

## 2016-09-22 NOTE — Progress Notes (Signed)
Patient in room from [facility] fluoro guide lumbar puncture. Laying flat and doing well.

## 2016-09-22 NOTE — Procedures (Signed)
Fluoroscopic guided lumbar puncture performed at L3-4 without immediate complication. Opening pressure 9 cm H2O. 12 mL clear CSF collected.

## 2016-09-23 ENCOUNTER — Inpatient Hospital Stay (HOSPITAL_COMMUNITY): Payer: PPO

## 2016-09-23 ENCOUNTER — Encounter (HOSPITAL_COMMUNITY): Payer: Self-pay | Admitting: Radiology

## 2016-09-23 LAB — CBC
HCT: 39.4 % (ref 36.0–46.0)
Hemoglobin: 13.3 g/dL (ref 12.0–15.0)
MCH: 31 pg (ref 26.0–34.0)
MCHC: 33.8 g/dL (ref 30.0–36.0)
MCV: 91.8 fL (ref 78.0–100.0)
PLATELETS: 210 10*3/uL (ref 150–400)
RBC: 4.29 MIL/uL (ref 3.87–5.11)
RDW: 12.5 % (ref 11.5–15.5)
WBC: 15.7 10*3/uL — AB (ref 4.0–10.5)

## 2016-09-23 LAB — COMPREHENSIVE METABOLIC PANEL
ALT: 29 U/L (ref 14–54)
AST: 31 U/L (ref 15–41)
Albumin: 3.3 g/dL — ABNORMAL LOW (ref 3.5–5.0)
Alkaline Phosphatase: 56 U/L (ref 38–126)
Anion gap: 11 (ref 5–15)
BUN: 16 mg/dL (ref 6–20)
CHLORIDE: 103 mmol/L (ref 101–111)
CO2: 24 mmol/L (ref 22–32)
Calcium: 9 mg/dL (ref 8.9–10.3)
Creatinine, Ser: 0.78 mg/dL (ref 0.44–1.00)
Glucose, Bld: 144 mg/dL — ABNORMAL HIGH (ref 65–99)
POTASSIUM: 3.4 mmol/L — AB (ref 3.5–5.1)
SODIUM: 138 mmol/L (ref 135–145)
Total Bilirubin: 0.6 mg/dL (ref 0.3–1.2)
Total Protein: 5.7 g/dL — ABNORMAL LOW (ref 6.5–8.1)

## 2016-09-23 LAB — ANGIOTENSIN CONVERTING ENZYME: ANGIOTENSIN-CONVERTING ENZYME: 24 U/L (ref 14–82)

## 2016-09-23 MED ORDER — GADOBENATE DIMEGLUMINE 529 MG/ML IV SOLN
13.0000 mL | Freq: Once | INTRAVENOUS | Status: AC | PRN
  Administered 2016-09-23: 13 mL via INTRAVENOUS

## 2016-09-23 MED ORDER — SODIUM CHLORIDE 0.9% FLUSH: 10.0000 mL | INTRAVENOUS | Status: DC | PRN

## 2016-09-23 MED ORDER — POTASSIUM CHLORIDE CRYS ER 20 MEQ PO TBCR
20.0000 meq | EXTENDED_RELEASE_TABLET | Freq: Once | ORAL | Status: AC
  Administered 2016-09-23: 20 meq via ORAL
  Filled 2016-09-23: qty 1

## 2016-09-23 NOTE — Progress Notes (Signed)
Peripherally Inserted Central Catheter/Midline Placement  The IV Nurse has discussed with the patient and/or persons authorized to consent for the patient, the purpose of this procedure and the potential benefits and risks involved with this procedure.  The benefits include less needle sticks, lab draws from the catheter, and the patient may be discharged home with the catheter. Risks include, but not limited to, infection, bleeding, blood clot (thrombus formation), and puncture of an artery; nerve damage and irregular heartbeat and possibility to perform a PICC exchange if needed/ordered by physician.  Alternatives to this procedure were also discussed.  Bard Power PICC patient education guide, fact sheet on infection prevention and patient information card has been provided to patient /or left at bedside.    PICC/Midline Placement Documentation  PICC Single Lumen 09/23/16 PICC Right Brachial 35 cm 0 cm (Active)  Indication for Insertion or Continuance of Line Home intravenous therapies (PICC only) 09/23/2016  5:46 PM  Exposed Catheter (cm) 0 cm 09/23/2016  5:46 PM  Site Assessment Clean;Dry;Intact 09/23/2016  5:46 PM  Line Status Flushed;Saline locked;Blood return noted 09/23/2016  5:46 PM  Dressing Type Transparent 09/23/2016  5:46 PM  Dressing Status Clean;Dry;Intact;Antimicrobial disc in place 09/23/2016  5:46 PM  Line Care Connections checked and tightened 09/23/2016  5:46 PM  Line Adjustment (NICU/IV Team Only) No 09/23/2016  5:46 PM  Dressing Intervention New dressing 09/23/2016  5:46 PM  Dressing Change Due 09/30/16 09/23/2016  5:46 PM       Rolena Infante 09/23/2016, 5:48 PM

## 2016-09-23 NOTE — Progress Notes (Addendum)
Patient ID: Katelyn Lamb, female   DOB: August 23, 1946, 71 y.o.   MRN: [id number]                                                                PROGRESS NOTE                                                                                                                                                                                                             Patient Demographics:    Katelyn Lamb, is a 71 y.o. female, DOB - [birth year], [id number]  Admit date - 09/20/2016   Admitting Physician Reubin Milan, MD  Outpatient Primary MD for the patient is No PCP Per Patient  LOS - 2  Outpatient Specialists:   Chief Complaint  Patient presents with  . Back Pain       Brief Narrative   71 y.o.femalewith medical history significant of anxiety and chronic back pain who is coming to the emergency department with progressively worse lower back pain and numbness that radiates to her lower extremities since 09/04/2016. She had left sided several vesicular dermatomal lesions on admission. MRI is suspicious for myelitis.   Probable transverse myelitis. MRI c/t/l spine: Abnormal spinal cord signal and enhancement without discrete mass. 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 Recommend correlation with cerebral spinal fluid studies. MRI brain: Cortical diffusion and FLAIR signal abnormality with some increased sulcal FLAIR signal centered in the anterior paramedian frontal and cingulate gyri. Findings are most consistent with a primary encephalitis with a component of meningitis, possibly reactive - On iv steroids, cont acyclovir iv, s/p LP 09/22/2016    Subjective:    [name] today still has numbness in the left leg.  Rash stable. No new vesicular lesion.    No headache, no photophobia, No chest pain, No abdominal pain - No Nausea,  No Cough - SOB.    Assessment  & Plan :    Principal Problem:   Numbness of left lower extremity Active Problems:   Intractable back pain  Hyponatremia   Herpes zoster    1. Shingles/  R/o transverse myelitis/encephalitis Cont iv acyclovir, cont iv steroids LP 1/20 Awaiting culture final results and hsv, vzv pcr Appreciate neurology input Picc line ordered to continue iv acyclovir for total 21 days  as per neurology Continue iv steroids for 5 day course  2. Hyponatremia, mild Check cmp in am  3. Rash likely to to zoster Cont acyclovir and contact isolation  4. Hypokalemia Replete, check cmp in am  5. Leukocytosis Secondary to steroids   Code Status : FULL CODE  Family Communication  : w patient  Disposition Plan  : home  Barriers For Discharge :   Consults  :  neurology  Procedures  : LP 1/20  DVT Prophylaxis  :  Lovenox - SCDs   Lab Results  Component Value Date   PLT 210 09/23/2016    Antibiotics  :    Anti-infectives    Start     Dose/Rate Route Frequency Ordered Stop   09/21/16 0500  acyclovir (ZOVIRAX) 650 mg in dextrose 5 % 100 mL IVPB     10 mg/kg  65 kg 113 mL/hr over 60 Minutes Intravenous Every 8 hours 09/21/16 0458     09/20/16 2200  valACYclovir (VALTREX) tablet 500 mg  Status:  Discontinued     500 mg Oral 2 times daily 09/20/16 2106 09/21/16 0453        Objective:   Vitals:   09/22/16 1454 09/22/16 2045 09/23/16 0632 09/23/16 1206  BP: (!) 143/91 126/76 122/66 120/67  Pulse: 95 76 80 73  Resp: 20 19 20 18   Temp:  98.3 F (36.8 C) 98.1 F (36.7 C) 97.5 F (36.4 C)  TempSrc:  Oral Oral Oral  SpO2: 98% 95% 93% 96%  Weight:      Height:        Wt Readings from Last 3 Encounters:  09/20/16 65 kg (143 lb 3.2 oz)  06/24/14 67.9 kg (149 lb 12.8 oz)  05/19/14 65.8 kg (145 lb)     Intake/Output Summary (Last 24 hours) at 09/23/16 1411 Last data filed at 09/23/16 1211  Gross per 24 hour  Intake             1502 ml  Output             2600 ml  Net            -1098 ml     Physical Exam  Awake Alert, Oriented X 3, No new F.N deficits, Normal  affect [place].AT,PERRAL Supple Neck,No JVD, No cervical lymphadenopathy appriciated.  Symmetrical Chest wall movement, Good air movement bilaterally, CTAB RRR,No Gallops,Rubs or new Murmurs, No Parasternal Heave +ve B.Sounds, Abd Soft, No tenderness, No organomegaly appriciated, No rebound - guarding or rigidity. No Cyanosis, Clubbing or edema,  Vesicular confluent rash on the left groin, left lateral leg   Data Review:    CBC  Recent Labs Lab 09/20/16 1420 09/21/16 0655 09/23/16 0348  WBC 10.9* 8.8 15.7*  HGB 15.8* 14.7 13.3  HCT 47.4* 43.5 39.4  PLT 272 221 210  MCV 95.0 93.5 91.8  MCH 31.7 31.6 31.0  MCHC 33.3 33.8 33.8  RDW 12.9 12.8 12.5  LYMPHSABS 2.5 1.0  --   MONOABS 0.9 0.0*  --   EOSABS 0.1 0.0  --   BASOSABS 0.0 0.0  --     Chemistries   Recent Labs Lab 09/20/16 1420 09/21/16 0655 09/22/16 0435 09/23/16 0348  NA 134* 131* 134* 138  K 3.9 4.3 4.6 3.4*  CL 96* 99* 104 103  CO2 23 20* 18* 24  GLUCOSE 70 117* 144* 144*  BUN 24* 19 15 16   CREATININE 1.00 0.84 0.70 0.78  CALCIUM 10.0 9.0 8.8* 9.0  AST  --  34  --  31  ALT  --  34  --  29  ALKPHOS  --  62  --  56  BILITOT  --  1.1  --  0.6   ------------------------------------------------------------------------------------------------------------------ No results for input(s): CHOL, HDL, LDLCALC, TRIG, CHOLHDL, LDLDIRECT in the last 72 hours.  No results found for: HGBA1C ------------------------------------------------------------------------------------------------------------------ No results for input(s): TSH, T4TOTAL, T3FREE, THYROIDAB in the last 72 hours.  Invalid input(s): FREET3 ------------------------------------------------------------------------------------------------------------------  Recent Labs  09/21/16 0655  VITAMINB12 569    Coagulation profile No results for input(s): INR, PROTIME in the last 168 hours.  No results for input(s): DDIMER in the last 72 hours.  Cardiac  Enzymes No results for input(s): CKMB, TROPONINI, MYOGLOBIN in the last 168 hours.  Invalid input(s): CK ------------------------------------------------------------------------------------------------------------------ No results found for: BNP  Inpatient Medications  Scheduled Meds: . acyclovir  10 mg/kg Intravenous Q8H  . calcium carbonate  1 tablet Oral Q breakfast  . cholecalciferol  1,000 Units Oral Daily  . diazepam  5 mg Oral BID  . gabapentin  100 mg Oral TID  . methylPREDNISolone (SOLU-MEDROL) injection  500 mg Intravenous Q12H   Continuous Infusions: . sodium chloride 75 mL/hr at 09/22/16 1208   PRN Meds:.ALPRAZolam, diphenhydrAMINE, HYDROcodone-acetaminophen, ketorolac, ondansetron **OR** ondansetron (ZOFRAN) IV  Micro Results Recent Results (from the past 240 hour(s))  CSF culture     Status: None (Preliminary result)   Collection Time: 09/22/16  5:57 PM  Result Value Ref Range Status   Specimen Description CSF  Final   Special Requests NONE  Final   Gram Stain   Final    WBC PRESENT, PREDOMINANTLY MONONUCLEAR NO ORGANISMS SEEN CYTOSPIN SMEAR    Culture NO GROWTH 1 DAY  Final   Report Status PENDING  Incomplete    Radiology Reports Dg Lumbar Spine Complete  Result Date: 09/09/2016 CLINICAL DATA:  Low back pain with left leg pain EXAM: LUMBAR SPINE - COMPLETE 4+ VIEW COMPARISON:  Lumbar MRI 02/26/2008 FINDINGS: 5 mm anterior slip L4-L5 has progressed in the interval. Remaining alignment normal. Negative for fracture or pars defect. Facet degeneration at L4-5. Disc spaces intact. IMPRESSION: Grade 1 anterolisthesis L4-5 has progressed since 2009. No acute abnormality Electronically Signed   By: Franchot Gallo M.D.   On: 09/09/2016 18:59   Mr Brain Wo Contrast  Result Date: 09/21/2016 CLINICAL DATA:  71 y/o F; worsening back pain and lower extremity weakness/numbness. EXAM: MRI HEAD WITHOUT CONTRAST TECHNIQUE: Multiplanar, multiecho pulse sequences of the brain  and surrounding structures were obtained without intravenous contrast. COMPARISON:  09/20/2016 cervical MRI.  08/01/2016 MRI of the brain. FINDINGS: Brain: Increased T2 FLAIR signal within the right paramedian frontal sulci (series 12, image 157). Within the bilateral paramedian cingulate and right frontal lobes in the region of incomplete FLAIR suppression there is cortical diffusion restriction and increased T2 FLAIR hyperintense signal (series 7 image 17 and series 5, image 26). Humerus T2 FLAIR hyperintense foci in white matter in both periventricular and subcortical white matter are similar in distribution in comparison with the prior MRI. On the sagittal T2 FLAIR volumetric sequence there are foci of signal abnormality at the cervicomedullary junction and upper cervical cord better characterized on the prior cervical MRI. No abnormal susceptibility hypointensity. No focal mass effect. No hydrocephalus. Vascular: Normal flow voids. Skull and upper cervical spine: Normal marrow signal. Sinuses/Orbits: Right mastoid effusion. No abnormal signal of paranasal sinuses or left mastoid air cell. Bilateral intra-ocular lens replacement.  Other: None. IMPRESSION: 1. Cortical diffusion and FLAIR signal abnormality with some increased sulcal FLAIR signal centered in the anterior paramedian frontal and cingulate gyri. 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 The pattern of meningeal and cortical inflammation is not typical of multiple sclerosis or neuromyelitis optica favoring an infectious or other autoimmune etiology. Consider follow-up with MRI of the brain with contrast. 2. Stable nonspecific T2 FLAIR hyperintense white matter foci may represent microvascular ischemic changes particularly in the setting of diabetes or hypertension or sequelae of demyelination, vasculitis, and other infectious/inflammatory processes. Electronically Signed   By: Kristine Garbe M.D.   On: 09/21/2016 02:57   Mr Jeri Cos [id number] Contrast  Result Date: 09/23/2016 CLINICAL DATA:  71 y/o  F; encephalitis. EXAM: MRI HEAD WITHOUT AND WITH CONTRAST TECHNIQUE: Multiplanar, multiecho pulse sequences of the brain and surrounding structures were obtained without and with intravenous contrast. CONTRAST:  25mL MULTIHANCE GADOBENATE DIMEGLUMINE 529 MG/ML IV SOLN COMPARISON:  09/21/2016 MRI of the brain. FINDINGS: Brain: Cortical T2 FLAIR hyperintense signal abnormality with diffusion restriction in the paramedian frontal and cingulate gyri is stable in distribution in comparison with the prior MRI of the brain. Sulcal increased FLAIR signal is largely resolved with minimal residual in the right paramedian frontal lobe (series 6, image 18). After administration of intravenous contrast there is faint cortical enhancement which corresponds best to diffusion restricting cortex (series 8, image 28 and series 13, image 23). There is no appreciable leptomeningeal enhancement. There are stable foci of T2 FLAIR hyperintense signal abnormality in subcortical and periventricular white matter from prior MRIs. There is no new focus of diffusion restriction, focal mass effect, FLAIR signal abnormality, or susceptibility hypointensity. There is no additional abnormal enhancement of the brain. Vascular: Normal flow voids. Skull and upper cervical spine: Normal marrow signal. Sinuses/Orbits: Stable right mastoid effusion. No abnormal signal of paranasal sinuses. Orbits are unremarkable. Other: None. IMPRESSION: 1. The region of paramedian frontal and cingulate gyrus cortical T2 FLAIR and diffusion signal abnormality is stable and demonstrates mild enhancement. Sulcal FLAIR signal abnormality has largely resolved and there is no leptomeningeal enhancement. Findings are consistent with encephalitis, probably slightly improved given diminution in sulcal FLAIR signal abnormality. No new areas of encephalitis  are identified. 2. No new signal abnormality of the brain parenchyma. 3. Stable background of nonspecific T2 FLAIR hyperintense white matter foci may represent microvascular ischemic changes particularly in the setting of diabetes or hypertension or sequelae of demyelination, vasculitis, and other infectious/inflammatory processes. Electronically Signed   By: Kristine Garbe M.D.   On: 09/23/2016 06:07   Mr Lumbar Spine Wo Contrast  Result Date: 09/20/2016 CLINICAL DATA:  Acute on chronic low back pain radiating to buttocks and LEFT lower extremity with numbness and tingling. On Vicodin with minimal relief. EXAM: MRI CERVICAL, THORACIC SPINE WITH AND WITHOUT CONTRAST MR LUMBAR SPINE WITHOUT TECHNIQUE: Multiplanar and multiecho pulse sequences of the cervical spine, to include the craniocervical junction and cervicothoracic junction, and thoracic with and without contrast. Multiplanar and multi echo pulse sequences of the lumbar spine, were obtained without intravenous contrast. CONTRAST:  15 cc MultiHance COMPARISON:  CT chest May 19, 2014 and MRI of lumbar spine February 26, 2008 FINDINGS: MRI CERVICAL SPINE FINDINGS ALIGNMENT: Straightened cervical lordosis.  No malalignment. VERTEBRAE/DISCS: Vertebral bodies are intact. Moderate to severe C3-4 thru C6-7 disc height loss of with moderate chronic discogenic endplate changes and disc desiccation. No suspicious bone marrow signal. No  suspicious osseous or intradiscal enhancement. CORD:Expansile T2 bright signal and patchy varying enhancement predominately involving the posterior columns C2 through C4-5 and more central within the spinal cord at C5 through C7-T1. No myelomalacia. No syrinx. No abnormal leptomeningeal or epidural enhancement. POSTERIOR FOSSA, VERTEBRAL ARTERIES, PARASPINAL TISSUES: No MR findings of ligamentous injury. Vertebral artery flow voids present. Included posterior fossa and paraspinal soft tissues are normal. DISC LEVELS: C2-3:  Small central disc protrusion. Mild facet arthropathy without canal stenosis or neural foraminal narrowing. C3-4: 2 mm broad-based disc bulge, uncovertebral hypertrophy and mild facet arthropathy. Mild canal stenosis. Moderate RIGHT, mild to moderate LEFT neural foraminal narrowing. C4-5: Annular bulging, uncovertebral hypertrophy and mild facet arthropathy. Mild canal stenosis. Moderate RIGHT and moderate to severe LEFT neural foraminal narrowing. C5-6: Broad-based disc bulge and LEFT central disc protrusion in total measure 3 mm in AP dimension. Moderate to severe canal stenosis, AP dimension the canal is 6 mm. Ventral cord deformity. Severe RIGHT and moderate to severe LEFT neural foraminal narrowing. C6-7: 2 mm broad-based disc bulge, uncovertebral hypertrophy and mild facet arthropathy. Moderate canal stenosis. Moderate to severe bilateral neural foraminal narrowing. C7-T1: No disc bulge, canal stenosis nor neural foraminal narrowing. MRI THORACIC SPINE FINDINGS ALIGNMENT: Maintenance of the thoracic kyphosis. No malalignment. VERTEBRAE/DISCS: Vertebral bodies are intact. Old minimally displaced sternal fracture. Intervertebral discs morphology and signal are normal. Multilevel mild chronic discogenic endplate changes/spurring. Moderate subacute on chronic discogenic endplate changes [id number]. Heterogeneous T1, bright STIR probable hemangioma T2, 10 mm. Additional large L1 hemangioma. No suspicious osseous or intradiscal enhancement. CORD: Patchy mildly expansile central spinal cord T2 hyperintense signal and varying enhancement T1-2 through T2, T5-6 through T7-8 with predominant dorsal cord involvement. Patchy T2 hyperintense signal within the conus medullaris. No syrinx. No abnormal leptomeningeal or epidural enhancement. No myelomalacia. PREVERTEBRAL AND PARASPINAL SOFT TISSUES:  Normal. DISC LEVELS: At T5-6 is 3 mm RIGHT central disc protrusion/extrusion without migration, contacting the ventral spinal cord  without canal stenosis or neural foraminal narrowing at any level. MRI LUMBAR SPINE FINDINGS SEGMENTATION: For the purposes of this report, the last well-formed intervertebral disc will be described as L5-S1. ALIGNMENT: Maintenance of the lumbar lordosis. Grade 1 L4-5 anterolisthesis is similar, no spondylolysis. VERTEBRAE:Vertebral bodies are intact. Mild L4-5 and L5-S1 disc height loss is similar with decreased T2 signal within the lower lumbar disc compatible with mild desiccation. Mild chronic discogenic endplate changes 075-GRM and L5-S1. No abnormal bone marrow signal. No suspicious bone marrow signal. Large L1 hemangioma with additional scattered small hemangiomas. CONUS MEDULLARIS: Conus medullaris terminates at L1-2. Cauda equina is normal. PARASPINAL AND SOFT TISSUES: Extremely distended urinary bladder. DISC LEVELS: L1-2: Small broad-based LEFT central disc protrusion, interval re- absorption of extrusion. No canal stenosis or neural foraminal narrowing. L2-3: No disc bulge, canal stenosis nor neural foraminal narrowing. L3-4: Annular bulging. Moderate facet arthropathy and ligamentum flavum redundancy with trace facet effusions which are likely reactive. No canal stenosis. Mild neural foraminal narrowing. L4-5: Anterolisthesis. Small broad-based disc bulge. Severe facet arthropathy and ligamentum flavum redundancy with trace facet effusions which are likely reactive. Mild canal stenosis including partial effacement lateral recesses which may affect the traversing L5 nerves. Moderate to severe RIGHT, moderate LEFT neural foraminal narrowing. L5-S1: Small broad-based disc bulge. Moderate to severe RIGHT, moderate LEFT facet arthropathy. Trace RIGHT facet effusion is likely reactive. No canal stenosis. Mild LEFT neural foraminal narrowing. IMPRESSION: MRI CERVICAL SPINE: Abnormal spinal cord signal and enhancement without discrete mass. Findings favor autoimmune or viral/post viral  myelitis. Recommend  correlation with cerebral spinal fluid studies. Degenerative cervical spine results in moderate to severe canal stenosis C5-6, moderate at C6-7. Neural foraminal narrowing C3-4 thru C6-7: Severe on the RIGHT at C5-6, moderate to severe multiple levels. MRI THORACIC SPINE: Abnormal spinal cord signal and enhancement without discrete mass. 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 Recommend correlation with cerebral spinal fluid studies. Small T5-6 disc protrusion/extrusion without canal stenosis or neural foraminal narrowing. MRI LUMBAR SPINE: Re- absorption of L1-2 disc extrusion with small residual protrusion. Degenerative lumbar spine resulting in mild canal stenosis L4-5, similar grade 1 L4-5 anterolisthesis on degenerative basis. Neural foraminal narrowing L3-4 through L5-S1: Moderate to severe on the RIGHT at L4-5. Preliminary acute findings discussed with and reconfirmed by PA.JAIME WARD on 09/20/2016 at 5:35 pm. Electronically Signed   By: Elon Alas M.D.   On: 09/20/2016 19:19   Mr Cervical Spine W Wo Contrast  Result Date: 09/20/2016 CLINICAL DATA:  Acute on chronic low back pain radiating to buttocks and LEFT lower extremity with numbness and tingling. On Vicodin with minimal relief. EXAM: MRI CERVICAL, THORACIC SPINE WITH AND WITHOUT CONTRAST MR LUMBAR SPINE WITHOUT TECHNIQUE: Multiplanar and multiecho pulse sequences of the cervical spine, to include the craniocervical junction and cervicothoracic junction, and thoracic with and without contrast. Multiplanar and multi echo pulse sequences of the lumbar spine, were obtained without intravenous contrast. CONTRAST:  15 cc MultiHance COMPARISON:  CT chest May 19, 2014 and MRI of lumbar spine February 26, 2008 FINDINGS: MRI CERVICAL SPINE FINDINGS ALIGNMENT: Straightened cervical lordosis.  No malalignment. VERTEBRAE/DISCS: Vertebral bodies are intact. Moderate to severe C3-4 thru C6-7 disc height loss of with moderate chronic  discogenic endplate changes and disc desiccation. No suspicious bone marrow signal. No suspicious osseous or intradiscal enhancement. CORD:Expansile T2 bright signal and patchy varying enhancement predominately involving the posterior columns C2 through C4-5 and more central within the spinal cord at C5 through C7-T1. No myelomalacia. No syrinx. No abnormal leptomeningeal or epidural enhancement. POSTERIOR FOSSA, VERTEBRAL ARTERIES, PARASPINAL TISSUES: No MR findings of ligamentous injury. Vertebral artery flow voids present. Included posterior fossa and paraspinal soft tissues are normal. DISC LEVELS: C2-3: Small central disc protrusion. Mild facet arthropathy without canal stenosis or neural foraminal narrowing. C3-4: 2 mm broad-based disc bulge, uncovertebral hypertrophy and mild facet arthropathy. Mild canal stenosis. Moderate RIGHT, mild to moderate LEFT neural foraminal narrowing. C4-5: Annular bulging, uncovertebral hypertrophy and mild facet arthropathy. Mild canal stenosis. Moderate RIGHT and moderate to severe LEFT neural foraminal narrowing. C5-6: Broad-based disc bulge and LEFT central disc protrusion in total measure 3 mm in AP dimension. Moderate to severe canal stenosis, AP dimension the canal is 6 mm. Ventral cord deformity. Severe RIGHT and moderate to severe LEFT neural foraminal narrowing. C6-7: 2 mm broad-based disc bulge, uncovertebral hypertrophy and mild facet arthropathy. Moderate canal stenosis. Moderate to severe bilateral neural foraminal narrowing. C7-T1: No disc bulge, canal stenosis nor neural foraminal narrowing. MRI THORACIC SPINE FINDINGS ALIGNMENT: Maintenance of the thoracic kyphosis. No malalignment. VERTEBRAE/DISCS: Vertebral bodies are intact. Old minimally displaced sternal fracture. Intervertebral discs morphology and signal are normal. Multilevel mild chronic discogenic endplate changes/spurring. Moderate subacute on chronic discogenic endplate changes [id number]. Heterogeneous  T1, bright STIR probable hemangioma T2, 10 mm. Additional large L1 hemangioma. No suspicious osseous or intradiscal enhancement. CORD: Patchy mildly expansile central spinal cord T2 hyperintense signal and varying enhancement T1-2 through T2, T5-6 through T7-8 with predominant dorsal cord involvement. Patchy T2 hyperintense signal within the  conus medullaris. No syrinx. No abnormal leptomeningeal or epidural enhancement. No myelomalacia. PREVERTEBRAL AND PARASPINAL SOFT TISSUES:  Normal. DISC LEVELS: At T5-6 is 3 mm RIGHT central disc protrusion/extrusion without migration, contacting the ventral spinal cord without canal stenosis or neural foraminal narrowing at any level. MRI LUMBAR SPINE FINDINGS SEGMENTATION: For the purposes of this report, the last well-formed intervertebral disc will be described as L5-S1. ALIGNMENT: Maintenance of the lumbar lordosis. Grade 1 L4-5 anterolisthesis is similar, no spondylolysis. VERTEBRAE:Vertebral bodies are intact. Mild L4-5 and L5-S1 disc height loss is similar with decreased T2 signal within the lower lumbar disc compatible with mild desiccation. Mild chronic discogenic endplate changes 075-GRM and L5-S1. No abnormal bone marrow signal. No suspicious bone marrow signal. Large L1 hemangioma with additional scattered small hemangiomas. CONUS MEDULLARIS: Conus medullaris terminates at L1-2. Cauda equina is normal. PARASPINAL AND SOFT TISSUES: Extremely distended urinary bladder. DISC LEVELS: L1-2: Small broad-based LEFT central disc protrusion, interval re- absorption of extrusion. No canal stenosis or neural foraminal narrowing. L2-3: No disc bulge, canal stenosis nor neural foraminal narrowing. L3-4: Annular bulging. Moderate facet arthropathy and ligamentum flavum redundancy with trace facet effusions which are likely reactive. No canal stenosis. Mild neural foraminal narrowing. L4-5: Anterolisthesis. Small broad-based disc bulge. Severe facet arthropathy and ligamentum flavum  redundancy with trace facet effusions which are likely reactive. Mild canal stenosis including partial effacement lateral recesses which may affect the traversing L5 nerves. Moderate to severe RIGHT, moderate LEFT neural foraminal narrowing. L5-S1: Small broad-based disc bulge. Moderate to severe RIGHT, moderate LEFT facet arthropathy. Trace RIGHT facet effusion is likely reactive. No canal stenosis. Mild LEFT neural foraminal narrowing. IMPRESSION: MRI CERVICAL SPINE: Abnormal spinal cord signal and enhancement without discrete mass. 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 Recommend correlation with cerebral spinal fluid studies. Degenerative cervical spine results in moderate to severe canal stenosis C5-6, moderate at C6-7. Neural foraminal narrowing C3-4 thru C6-7: Severe on the RIGHT at C5-6, moderate to severe multiple levels. MRI THORACIC SPINE: Abnormal spinal cord signal and enhancement without discrete mass. 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 Recommend correlation with cerebral spinal fluid studies. Small T5-6 disc protrusion/extrusion without canal stenosis or neural foraminal narrowing. MRI LUMBAR SPINE: Re- absorption of L1-2 disc extrusion with small residual protrusion. Degenerative lumbar spine resulting in mild canal stenosis L4-5, similar grade 1 L4-5 anterolisthesis on degenerative basis. Neural foraminal narrowing L3-4 through L5-S1: Moderate to severe on the RIGHT at L4-5. Preliminary acute findings discussed with and reconfirmed by PA.JAIME WARD on 09/20/2016 at 5:35 pm. Electronically Signed   By: Elon Alas M.D.   On: 09/20/2016 19:19   Mr Thoracic Spine W Wo Contrast  Result Date: 09/20/2016 CLINICAL DATA:  Acute on chronic low back pain radiating to buttocks and LEFT lower extremity with numbness and tingling. On Vicodin with minimal relief. EXAM: MRI CERVICAL, THORACIC SPINE WITH AND WITHOUT CONTRAST MR LUMBAR SPINE WITHOUT TECHNIQUE:  Multiplanar and multiecho pulse sequences of the cervical spine, to include the craniocervical junction and cervicothoracic junction, and thoracic with and without contrast. Multiplanar and multi echo pulse sequences of the lumbar spine, were obtained without intravenous contrast. CONTRAST:  15 cc MultiHance COMPARISON:  CT chest May 19, 2014 and MRI of lumbar spine February 26, 2008 FINDINGS: MRI CERVICAL SPINE FINDINGS ALIGNMENT: Straightened cervical lordosis.  No malalignment. VERTEBRAE/DISCS: Vertebral bodies are intact. Moderate to severe C3-4 thru C6-7 disc height loss of with moderate chronic discogenic endplate changes and disc desiccation. No suspicious bone  marrow signal. No suspicious osseous or intradiscal enhancement. CORD:Expansile T2 bright signal and patchy varying enhancement predominately involving the posterior columns C2 through C4-5 and more central within the spinal cord at C5 through C7-T1. No myelomalacia. No syrinx. No abnormal leptomeningeal or epidural enhancement. POSTERIOR FOSSA, VERTEBRAL ARTERIES, PARASPINAL TISSUES: No MR findings of ligamentous injury. Vertebral artery flow voids present. Included posterior fossa and paraspinal soft tissues are normal. DISC LEVELS: C2-3: Small central disc protrusion. Mild facet arthropathy without canal stenosis or neural foraminal narrowing. C3-4: 2 mm broad-based disc bulge, uncovertebral hypertrophy and mild facet arthropathy. Mild canal stenosis. Moderate RIGHT, mild to moderate LEFT neural foraminal narrowing. C4-5: Annular bulging, uncovertebral hypertrophy and mild facet arthropathy. Mild canal stenosis. Moderate RIGHT and moderate to severe LEFT neural foraminal narrowing. C5-6: Broad-based disc bulge and LEFT central disc protrusion in total measure 3 mm in AP dimension. Moderate to severe canal stenosis, AP dimension the canal is 6 mm. Ventral cord deformity. Severe RIGHT and moderate to severe LEFT neural foraminal narrowing. C6-7: 2  mm broad-based disc bulge, uncovertebral hypertrophy and mild facet arthropathy. Moderate canal stenosis. Moderate to severe bilateral neural foraminal narrowing. C7-T1: No disc bulge, canal stenosis nor neural foraminal narrowing. MRI THORACIC SPINE FINDINGS ALIGNMENT: Maintenance of the thoracic kyphosis. No malalignment. VERTEBRAE/DISCS: Vertebral bodies are intact. Old minimally displaced sternal fracture. Intervertebral discs morphology and signal are normal. Multilevel mild chronic discogenic endplate changes/spurring. Moderate subacute on chronic discogenic endplate changes [id number]. Heterogeneous T1, bright STIR probable hemangioma T2, 10 mm. Additional large L1 hemangioma. No suspicious osseous or intradiscal enhancement. CORD: Patchy mildly expansile central spinal cord T2 hyperintense signal and varying enhancement T1-2 through T2, T5-6 through T7-8 with predominant dorsal cord involvement. Patchy T2 hyperintense signal within the conus medullaris. No syrinx. No abnormal leptomeningeal or epidural enhancement. No myelomalacia. PREVERTEBRAL AND PARASPINAL SOFT TISSUES:  Normal. DISC LEVELS: At T5-6 is 3 mm RIGHT central disc protrusion/extrusion without migration, contacting the ventral spinal cord without canal stenosis or neural foraminal narrowing at any level. MRI LUMBAR SPINE FINDINGS SEGMENTATION: For the purposes of this report, the last well-formed intervertebral disc will be described as L5-S1. ALIGNMENT: Maintenance of the lumbar lordosis. Grade 1 L4-5 anterolisthesis is similar, no spondylolysis. VERTEBRAE:Vertebral bodies are intact. Mild L4-5 and L5-S1 disc height loss is similar with decreased T2 signal within the lower lumbar disc compatible with mild desiccation. Mild chronic discogenic endplate changes [id number] and L5-S1. No abnormal bone marrow signal. No suspicious bone marrow signal. Large L1 hemangioma with additional scattered small hemangiomas. CONUS MEDULLARIS: Conus medullaris terminates  at L1-2. Cauda equina is normal. PARASPINAL AND SOFT TISSUES: Extremely distended urinary bladder. DISC LEVELS: L1-2: Small broad-based LEFT central disc protrusion, interval re- absorption of extrusion. No canal stenosis or neural foraminal narrowing. L2-3: No disc bulge, canal stenosis nor neural foraminal narrowing. L3-4: Annular bulging. Moderate facet arthropathy and ligamentum flavum redundancy with trace facet effusions which are likely reactive. No canal stenosis. Mild neural foraminal narrowing. L4-5: Anterolisthesis. Small broad-based disc bulge. Severe facet arthropathy and ligamentum flavum redundancy with trace facet effusions which are likely reactive. Mild canal stenosis including partial effacement lateral recesses which may affect the traversing L5 nerves. Moderate to severe RIGHT, moderate LEFT neural foraminal narrowing. L5-S1: Small broad-based disc bulge. Moderate to severe RIGHT, moderate LEFT facet arthropathy. Trace RIGHT facet effusion is likely reactive. No canal stenosis. Mild LEFT neural foraminal narrowing. IMPRESSION: MRI CERVICAL SPINE: Abnormal spinal cord signal and enhancement without discrete mass. Findings favor autoimmune  or viral/post viral myelitis. Recommend correlation with cerebral spinal fluid studies. Degenerative cervical spine results in moderate to severe canal stenosis C5-6, moderate at C6-7. Neural foraminal narrowing C3-4 thru C6-7: Severe on the RIGHT at C5-6, moderate to severe multiple levels. MRI THORACIC SPINE: Abnormal spinal cord signal and enhancement without discrete mass. 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 Recommend correlation with cerebral spinal fluid studies. Small T5-6 disc protrusion/extrusion without canal stenosis or neural foraminal narrowing. MRI LUMBAR SPINE: Re- absorption of L1-2 disc extrusion with small residual protrusion. Degenerative lumbar spine resulting in mild canal stenosis L4-5, similar grade 1 L4-5  anterolisthesis on degenerative basis. Neural foraminal narrowing L3-4 through L5-S1: Moderate to severe on the RIGHT at L4-5. Preliminary acute findings discussed with and reconfirmed by PA.JAIME WARD on 09/20/2016 at 5:35 pm. Electronically Signed   By: Elon Alas M.D.   On: 09/20/2016 19:19   Dg Fluoro Guide Lumbar Puncture  Result Date: 09/22/2016 CLINICAL DATA:  Myelitis. EXAM: DIAGNOSTIC LUMBAR PUNCTURE UNDER FLUOROSCOPIC GUIDANCE FLUOROSCOPY TIME:  Fluoroscopy Time:  12 seconds Radiation Exposure Index (if provided by the fluoroscopic device): 21.25 microGray*m^2 Number of Acquired Spot Images: 0 PROCEDURE: Informed consent was obtained from the patient prior to the procedure, including potential complications of headache, allergy, and pain. With the patient prone, the lower back was prepped with Betadine. 1% Lidocaine was used for local anesthesia. Lumbar puncture was performed at the L3-4 level using a 3.5 inch 20 gauge needle via a right interlaminar approach with return of clear CSF with an opening pressure of 9 cm water (measured in the left lateral decubitus position). 12 ml of CSF were obtained for laboratory studies. The patient tolerated the procedure well and there were no apparent complications. IMPRESSION: Successful fluoroscopic guided lumbar puncture. Electronically Signed   By: Logan Bores M.D.   On: 09/22/2016 18:21    Time Spent in minutes  30   Jani Gravel M.D on 09/23/2016 at 2:11 PM  Between 7am to 7pm - Pager - [phone]  After 7pm go to [email or web address] - password [facility]  [facility] -  Office  [phone]

## 2016-09-23 NOTE — Progress Notes (Signed)
Subjective: Interval History: Still complains of tingling and numbness in the distal 4 extremities.  She complains of some abdominal distension and pain.  Foley in place.    CSF WBC 490 86%L, RBC 34, Glc 96, Prot 53, culture negative to date.  CSF HSV and VZV PCR pending.    MRI Brain with and without gad- no enhancing lesions- no changes from prior.    Objective: Vital signs in last 24 hours: Temp:  [98.1 F (36.7 C)-98.3 F (36.8 C)] 98.1 F (36.7 C) (01/21 [id number]) Pulse Rate:  [76-95] 80 (01/21 0632) Resp:  [19-20] 20 (01/21 [id number]) BP: (122-143)/(66-91) 122/66 (01/21 [id number]) SpO2:  [93 %-98 %] 93 % (01/21 [id number])  Intake/Output from previous day: 01/20 0701 - 01/21 0700 In: 1622 [P.O.:480; I.V.:975; IV Piggyback:167] Out: 2600 [Urine:2600] Intake/Output this shift: No intake/output data recorded. Nutritional status: Diet regular Room service appropriate? Yes; Fluid consistency: Thin  Neurologic Exam:  Awake, alert, fully oriented. Language normal. CN 2-12 intact. Motor 5/5 bil UE and LE. Sensory- decreased pinprick in the distal extremities. Coord- FTN intact. Gait- walking with PT.  Lab Results:  Recent Labs  09/21/16 0655 09/22/16 0435 09/23/16 0348  WBC 8.8  --  15.7*  HGB 14.7  --  13.3  HCT 43.5  --  39.4  PLT 221  --  210  NA 131* 134* 138  K 4.3 4.6 3.4*  CL 99* 104 103  CO2 20* 18* 24  GLUCOSE 117* 144* 144*  BUN 19 15 16   CREATININE 0.84 0.70 0.78  CALCIUM 9.0 8.8* 9.0   Lipid Panel No results for input(s): CHOL, TRIG, HDL, CHOLHDL, VLDL, LDLCALC in the last 72 hours.  Studies/Results: Mr Jeri Cos [id number] Contrast  Result Date: 09/23/2016 CLINICAL DATA:  71 y/o  F; encephalitis. EXAM: MRI HEAD WITHOUT AND WITH CONTRAST TECHNIQUE: Multiplanar, multiecho pulse sequences of the brain and surrounding structures were obtained without and with intravenous contrast. CONTRAST:  22mL MULTIHANCE GADOBENATE DIMEGLUMINE 529 MG/ML IV SOLN COMPARISON:  09/21/2016 MRI of  the brain. FINDINGS: Brain: Cortical T2 FLAIR hyperintense signal abnormality with diffusion restriction in the paramedian frontal and cingulate gyri is stable in distribution in comparison with the prior MRI of the brain. Sulcal increased FLAIR signal is largely resolved with minimal residual in the right paramedian frontal lobe (series 6, image 18). After administration of intravenous contrast there is faint cortical enhancement which corresponds best to diffusion restricting cortex (series 8, image 28 and series 13, image 23). There is no appreciable leptomeningeal enhancement. There are stable foci of T2 FLAIR hyperintense signal abnormality in subcortical and periventricular white matter from prior MRIs. There is no new focus of diffusion restriction, focal mass effect, FLAIR signal abnormality, or susceptibility hypointensity. There is no additional abnormal enhancement of the brain. Vascular: Normal flow voids. Skull and upper cervical spine: Normal marrow signal. Sinuses/Orbits: Stable right mastoid effusion. No abnormal signal of paranasal sinuses. Orbits are unremarkable. Other: None. IMPRESSION: 1. The region of paramedian frontal and cingulate gyrus cortical T2 FLAIR and diffusion signal abnormality is stable and demonstrates mild enhancement. Sulcal FLAIR signal abnormality has largely resolved and there is no leptomeningeal enhancement. Findings are consistent with encephalitis, probably slightly improved given diminution in sulcal FLAIR signal abnormality. No new areas of encephalitis are identified. 2. No new signal abnormality of the brain parenchyma. 3. Stable background of nonspecific T2 FLAIR hyperintense white matter foci may represent microvascular ischemic changes particularly in the setting of diabetes or hypertension or sequelae  of demyelination, vasculitis, and other infectious/inflammatory processes. Electronically Signed   By: Kristine Garbe M.D.   On: 09/23/2016 06:07   Dg  Fluoro Guide Lumbar Puncture  Result Date: 09/22/2016 CLINICAL DATA:  Myelitis. EXAM: DIAGNOSTIC LUMBAR PUNCTURE UNDER FLUOROSCOPIC GUIDANCE FLUOROSCOPY TIME:  Fluoroscopy Time:  12 seconds Radiation Exposure Index (if provided by the fluoroscopic device): 21.25 microGray*m^2 Number of Acquired Spot Images: 0 PROCEDURE: Informed consent was obtained from the patient prior to the procedure, including potential complications of headache, allergy, and pain. With the patient prone, the lower back was prepped with Betadine. 1% Lidocaine was used for local anesthesia. Lumbar puncture was performed at the L3-4 level using a 3.5 inch 20 gauge needle via a right interlaminar approach with return of clear CSF with an opening pressure of 9 cm water (measured in the left lateral decubitus position). 12 ml of CSF were obtained for laboratory studies. The patient tolerated the procedure well and there were no apparent complications. IMPRESSION: Successful fluoroscopic guided lumbar puncture. Electronically Signed   By: Logan Bores M.D.   On: 09/22/2016 18:21    Medications:  Scheduled: . acyclovir  10 mg/kg Intravenous Q8H  . calcium carbonate  1 tablet Oral Q breakfast  . cholecalciferol  1,000 Units Oral Daily  . diazepam  5 mg Oral BID  . gabapentin  100 mg Oral TID  . 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  Weakness has resolved.  She has some mild sensory changes still.  We will have to see how her bladder functions without the foley.  She may have constipation causing abdominal distension and discomfort which is related to spinal cord dysfunction as well.    She will need a total of 21 days of IV acyclovir and therefore will need PICC line placement at some point before discharge home with IV antiviral to continue there.  The solumedrol can be stopped after  5 full days of treatment.    Prognosis for recovery is good overall based on her progress thus far.       LOS: 2 days   Rogue Jury, MD 09/23/2016  11:34 AM

## 2016-09-24 DIAGNOSIS — R9089 Other abnormal findings on diagnostic imaging of central nervous system: Secondary | ICD-10-CM | POA: Diagnosis present

## 2016-09-24 DIAGNOSIS — G0490 Encephalitis and encephalomyelitis, unspecified: Secondary | ICD-10-CM | POA: Diagnosis present

## 2016-09-24 DIAGNOSIS — R200 Anesthesia of skin: Secondary | ICD-10-CM | POA: Diagnosis present

## 2016-09-24 DIAGNOSIS — G373 Acute transverse myelitis in demyelinating disease of central nervous system: Secondary | ICD-10-CM

## 2016-09-24 LAB — CBC
HCT: 37.1 % (ref 36.0–46.0)
Hemoglobin: 12.6 g/dL (ref 12.0–15.0)
MCH: 31.5 pg (ref 26.0–34.0)
MCHC: 34 g/dL (ref 30.0–36.0)
MCV: 92.8 fL (ref 78.0–100.0)
PLATELETS: 174 10*3/uL (ref 150–400)
RBC: 4 MIL/uL (ref 3.87–5.11)
RDW: 12.6 % (ref 11.5–15.5)
WBC: 11.4 10*3/uL — AB (ref 4.0–10.5)

## 2016-09-24 LAB — COMPREHENSIVE METABOLIC PANEL
ALT: 30 U/L (ref 14–54)
AST: 30 U/L (ref 15–41)
Albumin: 3.1 g/dL — ABNORMAL LOW (ref 3.5–5.0)
Alkaline Phosphatase: 54 U/L (ref 38–126)
Anion gap: 9 (ref 5–15)
BUN: 13 mg/dL (ref 6–20)
CHLORIDE: 102 mmol/L (ref 101–111)
CO2: 25 mmol/L (ref 22–32)
Calcium: 8.7 mg/dL — ABNORMAL LOW (ref 8.9–10.3)
Creatinine, Ser: 0.75 mg/dL (ref 0.44–1.00)
Glucose, Bld: 167 mg/dL — ABNORMAL HIGH (ref 65–99)
POTASSIUM: 3.3 mmol/L — AB (ref 3.5–5.1)
Sodium: 136 mmol/L (ref 135–145)
Total Bilirubin: 0.4 mg/dL (ref 0.3–1.2)
Total Protein: 5.5 g/dL — ABNORMAL LOW (ref 6.5–8.1)

## 2016-09-24 LAB — NEUROMYELITIS OPTICA AUTOAB, IGG: NMO-IGG: 1.7 U/mL (ref 0.0–3.0)

## 2016-09-24 LAB — HERPES SIMPLEX VIRUS(HSV) DNA BY PCR
HSV 1 DNA: NEGATIVE
HSV 2 DNA: POSITIVE — AB

## 2016-09-24 LAB — VDRL, CSF: VDRL Quant, CSF: NONREACTIVE

## 2016-09-24 LAB — ANTINUCLEAR ANTIBODIES, IFA: ANTINUCLEAR ANTIBODIES, IFA: NEGATIVE

## 2016-09-24 MED ORDER — POTASSIUM CHLORIDE CRYS ER 20 MEQ PO TBCR
40.0000 meq | EXTENDED_RELEASE_TABLET | Freq: Four times a day (QID) | ORAL | Status: AC
  Administered 2016-09-24 (×2): 40 meq via ORAL
  Filled 2016-09-24: qty 2
  Filled 2016-09-24: qty 4

## 2016-09-24 NOTE — Progress Notes (Signed)
CRITICAL VALUE ALERT  Critical value received:  CSF positive for HSV 2 DNA  Date of notification:  09/24/16  Time of notification:  11:30  Critical value read back: yes, positive for HSV 2 DNA  Nurse who received alert:  Derald Macleod  MD notified (1st page):  Dr. Hartford Poli  Time of first page:  11:45  MD notified (2nd page): na  Time of second page:na  Responding MD:  waiting  Time MD responded:  na

## 2016-09-24 NOTE — Progress Notes (Signed)
Patient ID: Katelyn Lamb, female   DOB: March 05, 1946, 71 y.o.   MRN: [id number]                                                                PROGRESS NOTE                                                                                                                                                                                                             Patient Demographics:    Katelyn Lamb, is a 71 y.o. female, DOB - 01-24-1946, [id number]  Admit date - 09/20/2016   Admitting Physician Reubin Milan, MD  Outpatient Primary MD for the patient is No PCP Per Patient  LOS - 3  Outpatient Specialists:   Chief Complaint  Patient presents with  . Back Pain       Brief Narrative   71 y.o.femalewith medical history significant of anxiety and chronic back pain who is coming to the emergency department with progressively worse lower back pain and numbness that radiates to her lower extremities since 09/04/2016. She had left sided several vesicular dermatomal lesions on admission. MRI is suspicious for myelitis.   Probable transverse myelitis. MRI c/t/l spine: Abnormal spinal cord signal and enhancement without discrete mass. 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 Recommend correlation with cerebral spinal fluid studies. MRI brain: Cortical diffusion and FLAIR signal abnormality with some increased sulcal FLAIR signal centered in the anterior paramedian frontal and cingulate gyri. Findings are most consistent with a primary encephalitis with a component of meningitis, possibly reactive - On iv steroids, cont acyclovir iv, s/p LP 09/22/2016    Subjective:   Continues to have numbness in lower extremities and hands. Denies any headache, photophobia or meningeal sign   Assessment  & Plan :    Principal Problem:   Numbness of left lower extremity Active Problems:   Intractable back pain   Hyponatremia   Herpes zoster    Rule out transverse  myelitis/encephalitis Patient is on IV acyclovir and IV steroids, per neurology continue acyclovir for total of 3 weeks. 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 Likely can be discharged in a.m.  Hyponatremia, mild Check cmp in am  Rash likely to to zoster Cont acyclovir  and contact isolation  Hypokalemia Replete, check cmp in am  Leukocytosis Secondary to steroids   Code Status : FULL CODE  Family Communication  : w patient  Disposition Plan  : home  Barriers For Discharge :   Consults  :  neurology  Procedures  : LP 1/20  DVT Prophylaxis  :  Lovenox - SCDs   Lab Results  Component Value Date   PLT 174 09/24/2016    Antibiotics  :    Anti-infectives    Start     Dose/Rate Route Frequency Ordered Stop   09/21/16 0500  acyclovir (ZOVIRAX) 650 mg in dextrose 5 % 100 mL IVPB     10 mg/kg  65 kg 113 mL/hr over 60 Minutes Intravenous Every 8 hours 09/21/16 0458     09/20/16 2200  valACYclovir (VALTREX) tablet 500 mg  Status:  Discontinued     500 mg Oral 2 times daily 09/20/16 2106 09/21/16 0453        Objective:   Vitals:   09/23/16 1206 09/23/16 2156 09/24/16 0418 09/24/16 1321  BP: 120/67 135/75 132/81 122/73  Pulse: 73 77 70 83  Resp: 18 19 19 18   Temp: 97.5 F (36.4 C) 97.5 F (36.4 C) 97.6 F (36.4 C) 98 F (36.7 C)  TempSrc: Oral Oral Oral Oral  SpO2: 96% 96% 96% 96%  Weight:      Height:        Wt Readings from Last 3 Encounters:  09/20/16 65 kg (143 lb 3.2 oz)  06/24/14 67.9 kg (149 lb 12.8 oz)  05/19/14 65.8 kg (145 lb)     Intake/Output Summary (Last 24 hours) at 09/24/16 1437 Last data filed at 09/24/16 1030  Gross per 24 hour  Intake             3802 ml  Output             3950 ml  Net             -148 ml     Physical Exam  Awake Alert, Oriented X 3, No new F.N deficits, Normal affect Capac.AT,PERRAL Supple Neck,No JVD, No cervical lymphadenopathy appriciated.  Symmetrical Chest  wall movement, Good air movement bilaterally, CTAB RRR,No Gallops,Rubs or new Murmurs, No Parasternal Heave +ve B.Sounds, Abd Soft, No tenderness, No organomegaly appriciated, No rebound - guarding or rigidity. No Cyanosis, Clubbing or edema,  Vesicular confluent rash on the left groin, left lateral leg   Data Review:    CBC  Recent Labs Lab 09/20/16 1420 09/21/16 0655 09/23/16 0348 09/24/16 0452  WBC 10.9* 8.8 15.7* 11.4*  HGB 15.8* 14.7 13.3 12.6  HCT 47.4* 43.5 39.4 37.1  PLT 272 221 210 174  MCV 95.0 93.5 91.8 92.8  MCH 31.7 31.6 31.0 31.5  MCHC 33.3 33.8 33.8 34.0  RDW 12.9 12.8 12.5 12.6  LYMPHSABS 2.5 1.0  --   --   MONOABS 0.9 0.0*  --   --   EOSABS 0.1 0.0  --   --   BASOSABS 0.0 0.0  --   --     Chemistries   Recent Labs Lab 09/20/16 1420 09/21/16 0655 09/22/16 0435 09/23/16 0348 09/24/16 0452  NA 134* 131* 134* 138 136  K 3.9 4.3 4.6 3.4* 3.3*  CL 96* 99* 104 103 102  CO2 23 20* 18* 24 25  GLUCOSE 70 117* 144* 144* 167*  BUN 24* 19 15 16 13   CREATININE 1.00 0.84 0.70 0.78 0.75  CALCIUM 10.0 9.0 8.8* 9.0 8.7*  AST  --  34  --  31 30  ALT  --  34  --  29 30  ALKPHOS  --  62  --  56 54  BILITOT  --  1.1  --  0.6 0.4   ------------------------------------------------------------------------------------------------------------------ No results for input(s): CHOL, HDL, LDLCALC, TRIG, CHOLHDL, LDLDIRECT in the last 72 hours.  No results found for: HGBA1C ------------------------------------------------------------------------------------------------------------------ No results for input(s): TSH, T4TOTAL, T3FREE, THYROIDAB in the last 72 hours.  Invalid input(s): FREET3 ------------------------------------------------------------------------------------------------------------------ No results for input(s): VITAMINB12, FOLATE, FERRITIN, TIBC, IRON, RETICCTPCT in the last 72 hours.  Coagulation profile No results for input(s): INR, PROTIME in the  last 168 hours.  No results for input(s): DDIMER in the last 72 hours.  Cardiac Enzymes No results for input(s): CKMB, TROPONINI, MYOGLOBIN in the last 168 hours.  Invalid input(s): CK ------------------------------------------------------------------------------------------------------------------ No results found for: BNP  Inpatient Medications  Scheduled Meds: . acyclovir  10 mg/kg Intravenous Q8H  . calcium carbonate  1 tablet Oral Q breakfast  . cholecalciferol  1,000 Units Oral Daily  . diazepam  5 mg Oral BID  . gabapentin  100 mg Oral TID  . methylPREDNISolone (SOLU-MEDROL) injection  500 mg Intravenous Q12H  . potassium chloride  40 mEq Oral Q6H   Continuous Infusions:  PRN Meds:.ALPRAZolam, diphenhydrAMINE, HYDROcodone-acetaminophen, ketorolac, ondansetron **OR** ondansetron (ZOFRAN) IV, sodium chloride flush  Micro Results Recent Results (from the past 240 hour(s))  CSF culture     Status: None (Preliminary result)   Collection Time: 09/22/16  5:57 PM  Result Value Ref Range Status   Specimen Description CSF  Final   Special Requests NONE  Final   Gram Stain   Final    WBC PRESENT, PREDOMINANTLY MONONUCLEAR NO ORGANISMS SEEN CYTOSPIN SMEAR    Culture NO GROWTH 2 DAYS  Final   Report Status PENDING  Incomplete    Radiology Reports Dg Lumbar Spine Complete  Result Date: 09/09/2016 CLINICAL DATA:  Low back pain with left leg pain EXAM: LUMBAR SPINE - COMPLETE 4+ VIEW COMPARISON:  Lumbar MRI 02/26/2008 FINDINGS: 5 mm anterior slip L4-L5 has progressed in the interval. Remaining alignment normal. Negative for fracture or pars defect. Facet degeneration at L4-5. Disc spaces intact. IMPRESSION: Grade 1 anterolisthesis L4-5 has progressed since 2009. No acute abnormality Electronically Signed   By: Franchot Gallo M.D.   On: 09/09/2016 18:59   Mr Brain Wo Contrast  Result Date: 09/21/2016 CLINICAL DATA:  71 y/o F; worsening back pain and lower extremity  weakness/numbness. EXAM: MRI HEAD WITHOUT CONTRAST TECHNIQUE: Multiplanar, multiecho pulse sequences of the brain and surrounding structures were obtained without intravenous contrast. COMPARISON:  09/20/2016 cervical MRI.  08/01/2016 MRI of the brain. FINDINGS: Brain: Increased T2 FLAIR signal within the right paramedian frontal sulci (series 12, image 157). Within the bilateral paramedian cingulate and right frontal lobes in the region of incomplete FLAIR suppression there is cortical diffusion restriction and increased T2 FLAIR hyperintense signal (series 7 image 17 and series 5, image 26). Humerus T2 FLAIR hyperintense foci in white matter in both periventricular and subcortical white matter are similar in distribution in comparison with the prior MRI. On the sagittal T2 FLAIR volumetric sequence there are foci of signal abnormality at the cervicomedullary junction and upper cervical cord better characterized on the prior cervical MRI. No abnormal susceptibility hypointensity. No focal mass effect. No hydrocephalus. Vascular: Normal flow voids. Skull and upper cervical spine: Normal marrow signal. Sinuses/Orbits:  Right mastoid effusion. No abnormal signal of paranasal sinuses or left mastoid air cell. Bilateral intra-ocular lens replacement. Other: None. IMPRESSION: 1. Cortical diffusion and FLAIR signal abnormality with some increased sulcal FLAIR signal centered in the anterior paramedian frontal and cingulate gyri. 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 The pattern of meningeal and cortical inflammation is not typical of multiple sclerosis or neuromyelitis optica favoring an infectious or other autoimmune etiology. Consider follow-up with MRI of the brain with contrast. 2. Stable nonspecific T2 FLAIR hyperintense white matter foci may represent microvascular ischemic changes particularly in the setting of diabetes or hypertension or sequelae  of demyelination, vasculitis, and other infectious/inflammatory processes. Electronically Signed   By: Kristine Garbe M.D.   On: 09/21/2016 02:57   Mr Jeri Cos [id number] Contrast  Result Date: 09/23/2016 CLINICAL DATA:  71 y/o  F; encephalitis. EXAM: MRI HEAD WITHOUT AND WITH CONTRAST TECHNIQUE: Multiplanar, multiecho pulse sequences of the brain and surrounding structures were obtained without and with intravenous contrast. CONTRAST:  71mL MULTIHANCE GADOBENATE DIMEGLUMINE 529 MG/ML IV SOLN COMPARISON:  09/21/2016 MRI of the brain. FINDINGS: Brain: Cortical T2 FLAIR hyperintense signal abnormality with diffusion restriction in the paramedian frontal and cingulate gyri is stable in distribution in comparison with the prior MRI of the brain. Sulcal increased FLAIR signal is largely resolved with minimal residual in the right paramedian frontal lobe (series 6, image 18). After administration of intravenous contrast there is faint cortical enhancement which corresponds best to diffusion restricting cortex (series 8, image 28 and series 13, image 23). There is no appreciable leptomeningeal enhancement. There are stable foci of T2 FLAIR hyperintense signal abnormality in subcortical and periventricular white matter from prior MRIs. There is no new focus of diffusion restriction, focal mass effect, FLAIR signal abnormality, or susceptibility hypointensity. There is no additional abnormal enhancement of the brain. Vascular: Normal flow voids. Skull and upper cervical spine: Normal marrow signal. Sinuses/Orbits: Stable right mastoid effusion. No abnormal signal of paranasal sinuses. Orbits are unremarkable. Other: None. IMPRESSION: 1. The region of paramedian frontal and cingulate gyrus cortical T2 FLAIR and diffusion signal abnormality is stable and demonstrates mild enhancement. Sulcal FLAIR signal abnormality has largely resolved and there is no leptomeningeal enhancement. Findings are consistent with encephalitis,  probably slightly improved given diminution in sulcal FLAIR signal abnormality. No new areas of encephalitis are identified. 2. No new signal abnormality of the brain parenchyma. 3. Stable background of nonspecific T2 FLAIR hyperintense white matter foci may represent microvascular ischemic changes particularly in the setting of diabetes or hypertension or sequelae of demyelination, vasculitis, and other infectious/inflammatory processes. Electronically Signed   By: Kristine Garbe M.D.   On: 09/23/2016 06:07   Mr Lumbar Spine Wo Contrast  Result Date: 09/20/2016 CLINICAL DATA:  Acute on chronic low back pain radiating to buttocks and LEFT lower extremity with numbness and tingling. On Vicodin with minimal relief. EXAM: MRI CERVICAL, THORACIC SPINE WITH AND WITHOUT CONTRAST MR LUMBAR SPINE WITHOUT TECHNIQUE: Multiplanar and multiecho pulse sequences of the cervical spine, to include the craniocervical junction and cervicothoracic junction, and thoracic with and without contrast. Multiplanar and multi echo pulse sequences of the lumbar spine, were obtained without intravenous contrast. CONTRAST:  15 cc MultiHance COMPARISON:  CT chest May 19, 2014 and MRI of lumbar spine February 26, 2008 FINDINGS: MRI CERVICAL SPINE FINDINGS ALIGNMENT: Straightened cervical lordosis.  No malalignment. VERTEBRAE/DISCS: Vertebral bodies are intact. Moderate to severe C3-4 thru C6-7 disc  height loss of with moderate chronic discogenic endplate changes and disc desiccation. No suspicious bone marrow signal. No suspicious osseous or intradiscal enhancement. CORD:Expansile T2 bright signal and patchy varying enhancement predominately involving the posterior columns C2 through C4-5 and more central within the spinal cord at C5 through C7-T1. No myelomalacia. No syrinx. No abnormal leptomeningeal or epidural enhancement. POSTERIOR FOSSA, VERTEBRAL ARTERIES, PARASPINAL TISSUES: No MR findings of ligamentous injury. Vertebral  artery flow voids present. Included posterior fossa and paraspinal soft tissues are normal. DISC LEVELS: C2-3: Small central disc protrusion. Mild facet arthropathy without canal stenosis or neural foraminal narrowing. C3-4: 2 mm broad-based disc bulge, uncovertebral hypertrophy and mild facet arthropathy. Mild canal stenosis. Moderate RIGHT, mild to moderate LEFT neural foraminal narrowing. C4-5: Annular bulging, uncovertebral hypertrophy and mild facet arthropathy. Mild canal stenosis. Moderate RIGHT and moderate to severe LEFT neural foraminal narrowing. C5-6: Broad-based disc bulge and LEFT central disc protrusion in total measure 3 mm in AP dimension. Moderate to severe canal stenosis, AP dimension the canal is 6 mm. Ventral cord deformity. Severe RIGHT and moderate to severe LEFT neural foraminal narrowing. C6-7: 2 mm broad-based disc bulge, uncovertebral hypertrophy and mild facet arthropathy. Moderate canal stenosis. Moderate to severe bilateral neural foraminal narrowing. C7-T1: No disc bulge, canal stenosis nor neural foraminal narrowing. MRI THORACIC SPINE FINDINGS ALIGNMENT: Maintenance of the thoracic kyphosis. No malalignment. VERTEBRAE/DISCS: Vertebral bodies are intact. Old minimally displaced sternal fracture. Intervertebral discs morphology and signal are normal. Multilevel mild chronic discogenic endplate changes/spurring. Moderate subacute on chronic discogenic endplate changes [id number]. Heterogeneous T1, bright STIR probable hemangioma T2, 10 mm. Additional large L1 hemangioma. No suspicious osseous or intradiscal enhancement. CORD: Patchy mildly expansile central spinal cord T2 hyperintense signal and varying enhancement T1-2 through T2, T5-6 through T7-8 with predominant dorsal cord involvement. Patchy T2 hyperintense signal within the conus medullaris. No syrinx. No abnormal leptomeningeal or epidural enhancement. No myelomalacia. PREVERTEBRAL AND PARASPINAL SOFT TISSUES:  Normal. DISC LEVELS:  At T5-6 is 3 mm RIGHT central disc protrusion/extrusion without migration, contacting the ventral spinal cord without canal stenosis or neural foraminal narrowing at any level. MRI LUMBAR SPINE FINDINGS SEGMENTATION: For the purposes of this report, the last well-formed intervertebral disc will be described as L5-S1. ALIGNMENT: Maintenance of the lumbar lordosis. Grade 1 L4-5 anterolisthesis is similar, no spondylolysis. VERTEBRAE:Vertebral bodies are intact. Mild L4-5 and L5-S1 disc height loss is similar with decreased T2 signal within the lower lumbar disc compatible with mild desiccation. Mild chronic discogenic endplate changes 075-GRM and L5-S1. No abnormal bone marrow signal. No suspicious bone marrow signal. Large L1 hemangioma with additional scattered small hemangiomas. CONUS MEDULLARIS: Conus medullaris terminates at L1-2. Cauda equina is normal. PARASPINAL AND SOFT TISSUES: Extremely distended urinary bladder. DISC LEVELS: L1-2: Small broad-based LEFT central disc protrusion, interval re- absorption of extrusion. No canal stenosis or neural foraminal narrowing. L2-3: No disc bulge, canal stenosis nor neural foraminal narrowing. L3-4: Annular bulging. Moderate facet arthropathy and ligamentum flavum redundancy with trace facet effusions which are likely reactive. No canal stenosis. Mild neural foraminal narrowing. L4-5: Anterolisthesis. Small broad-based disc bulge. Severe facet arthropathy and ligamentum flavum redundancy with trace facet effusions which are likely reactive. Mild canal stenosis including partial effacement lateral recesses which may affect the traversing L5 nerves. Moderate to severe RIGHT, moderate LEFT neural foraminal narrowing. L5-S1: Small broad-based disc bulge. Moderate to severe RIGHT, moderate LEFT facet arthropathy. Trace RIGHT facet effusion is likely reactive. No canal stenosis. Mild LEFT neural foraminal narrowing. IMPRESSION:  MRI CERVICAL SPINE: Abnormal spinal cord signal and  enhancement without discrete mass. 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 Recommend correlation with cerebral spinal fluid studies. Degenerative cervical spine results in moderate to severe canal stenosis C5-6, moderate at C6-7. Neural foraminal narrowing C3-4 thru C6-7: Severe on the RIGHT at C5-6, moderate to severe multiple levels. MRI THORACIC SPINE: Abnormal spinal cord signal and enhancement without discrete mass. 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 Recommend correlation with cerebral spinal fluid studies. Small T5-6 disc protrusion/extrusion without canal stenosis or neural foraminal narrowing. MRI LUMBAR SPINE: Re- absorption of L1-2 disc extrusion with small residual protrusion. Degenerative lumbar spine resulting in mild canal stenosis L4-5, similar grade 1 L4-5 anterolisthesis on degenerative basis. Neural foraminal narrowing L3-4 through L5-S1: Moderate to severe on the RIGHT at L4-5. Preliminary acute findings discussed with and reconfirmed by PA.JAIME WARD on 09/20/2016 at 5:35 pm. Electronically Signed   By: Elon Alas M.D.   On: 09/20/2016 19:19   Mr Cervical Spine W Wo Contrast  Result Date: 09/20/2016 CLINICAL DATA:  Acute on chronic low back pain radiating to buttocks and LEFT lower extremity with numbness and tingling. On Vicodin with minimal relief. EXAM: MRI CERVICAL, THORACIC SPINE WITH AND WITHOUT CONTRAST MR LUMBAR SPINE WITHOUT TECHNIQUE: Multiplanar and multiecho pulse sequences of the cervical spine, to include the craniocervical junction and cervicothoracic junction, and thoracic with and without contrast. Multiplanar and multi echo pulse sequences of the lumbar spine, were obtained without intravenous contrast. CONTRAST:  15 cc MultiHance COMPARISON:  CT chest May 19, 2014 and MRI of lumbar spine February 26, 2008 FINDINGS: MRI CERVICAL SPINE FINDINGS ALIGNMENT: Straightened cervical lordosis.  No malalignment. VERTEBRAE/DISCS: Vertebral  bodies are intact. Moderate to severe C3-4 thru C6-7 disc height loss of with moderate chronic discogenic endplate changes and disc desiccation. No suspicious bone marrow signal. No suspicious osseous or intradiscal enhancement. CORD:Expansile T2 bright signal and patchy varying enhancement predominately involving the posterior columns C2 through C4-5 and more central within the spinal cord at C5 through C7-T1. No myelomalacia. No syrinx. No abnormal leptomeningeal or epidural enhancement. POSTERIOR FOSSA, VERTEBRAL ARTERIES, PARASPINAL TISSUES: No MR findings of ligamentous injury. Vertebral artery flow voids present. Included posterior fossa and paraspinal soft tissues are normal. DISC LEVELS: C2-3: Small central disc protrusion. Mild facet arthropathy without canal stenosis or neural foraminal narrowing. C3-4: 2 mm broad-based disc bulge, uncovertebral hypertrophy and mild facet arthropathy. Mild canal stenosis. Moderate RIGHT, mild to moderate LEFT neural foraminal narrowing. C4-5: Annular bulging, uncovertebral hypertrophy and mild facet arthropathy. Mild canal stenosis. Moderate RIGHT and moderate to severe LEFT neural foraminal narrowing. C5-6: Broad-based disc bulge and LEFT central disc protrusion in total measure 3 mm in AP dimension. Moderate to severe canal stenosis, AP dimension the canal is 6 mm. Ventral cord deformity. Severe RIGHT and moderate to severe LEFT neural foraminal narrowing. C6-7: 2 mm broad-based disc bulge, uncovertebral hypertrophy and mild facet arthropathy. Moderate canal stenosis. Moderate to severe bilateral neural foraminal narrowing. C7-T1: No disc bulge, canal stenosis nor neural foraminal narrowing. MRI THORACIC SPINE FINDINGS ALIGNMENT: Maintenance of the thoracic kyphosis. No malalignment. VERTEBRAE/DISCS: Vertebral bodies are intact. Old minimally displaced sternal fracture. Intervertebral discs morphology and signal are normal. Multilevel mild chronic discogenic endplate  changes/spurring. Moderate subacute on chronic discogenic endplate changes [id number]. Heterogeneous T1, bright STIR probable hemangioma T2, 10 mm. Additional large L1 hemangioma. No suspicious osseous or intradiscal enhancement. CORD: Patchy mildly expansile central spinal cord T2 hyperintense signal and varying  enhancement T1-2 through T2, T5-6 through T7-8 with predominant dorsal cord involvement. Patchy T2 hyperintense signal within the conus medullaris. No syrinx. No abnormal leptomeningeal or epidural enhancement. No myelomalacia. PREVERTEBRAL AND PARASPINAL SOFT TISSUES:  Normal. DISC LEVELS: At T5-6 is 3 mm RIGHT central disc protrusion/extrusion without migration, contacting the ventral spinal cord without canal stenosis or neural foraminal narrowing at any level. MRI LUMBAR SPINE FINDINGS SEGMENTATION: For the purposes of this report, the last well-formed intervertebral disc will be described as L5-S1. ALIGNMENT: Maintenance of the lumbar lordosis. Grade 1 L4-5 anterolisthesis is similar, no spondylolysis. VERTEBRAE:Vertebral bodies are intact. Mild L4-5 and L5-S1 disc height loss is similar with decreased T2 signal within the lower lumbar disc compatible with mild desiccation. Mild chronic discogenic endplate changes 075-GRM and L5-S1. No abnormal bone marrow signal. No suspicious bone marrow signal. Large L1 hemangioma with additional scattered small hemangiomas. CONUS MEDULLARIS: Conus medullaris terminates at L1-2. Cauda equina is normal. PARASPINAL AND SOFT TISSUES: Extremely distended urinary bladder. DISC LEVELS: L1-2: Small broad-based LEFT central disc protrusion, interval re- absorption of extrusion. No canal stenosis or neural foraminal narrowing. L2-3: No disc bulge, canal stenosis nor neural foraminal narrowing. L3-4: Annular bulging. Moderate facet arthropathy and ligamentum flavum redundancy with trace facet effusions which are likely reactive. No canal stenosis. Mild neural foraminal narrowing.  L4-5: Anterolisthesis. Small broad-based disc bulge. Severe facet arthropathy and ligamentum flavum redundancy with trace facet effusions which are likely reactive. Mild canal stenosis including partial effacement lateral recesses which may affect the traversing L5 nerves. Moderate to severe RIGHT, moderate LEFT neural foraminal narrowing. L5-S1: Small broad-based disc bulge. Moderate to severe RIGHT, moderate LEFT facet arthropathy. Trace RIGHT facet effusion is likely reactive. No canal stenosis. Mild LEFT neural foraminal narrowing. IMPRESSION: MRI CERVICAL SPINE: Abnormal spinal cord signal and enhancement without discrete mass. 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 Recommend correlation with cerebral spinal fluid studies. Degenerative cervical spine results in moderate to severe canal stenosis C5-6, moderate at C6-7. Neural foraminal narrowing C3-4 thru C6-7: Severe on the RIGHT at C5-6, moderate to severe multiple levels. MRI THORACIC SPINE: Abnormal spinal cord signal and enhancement without discrete mass. 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 Recommend correlation with cerebral spinal fluid studies. Small T5-6 disc protrusion/extrusion without canal stenosis or neural foraminal narrowing. MRI LUMBAR SPINE: Re- absorption of L1-2 disc extrusion with small residual protrusion. Degenerative lumbar spine resulting in mild canal stenosis L4-5, similar grade 1 L4-5 anterolisthesis on degenerative basis. Neural foraminal narrowing L3-4 through L5-S1: Moderate to severe on the RIGHT at L4-5. Preliminary acute findings discussed with and reconfirmed by PA.JAIME WARD on 09/20/2016 at 5:35 pm. Electronically Signed   By: Elon Alas M.D.   On: 09/20/2016 19:19   Mr Thoracic Spine W Wo Contrast  Result Date: 09/20/2016 CLINICAL DATA:  Acute on chronic low back pain radiating to buttocks and LEFT lower extremity with numbness and tingling. On Vicodin with minimal relief. EXAM:  MRI CERVICAL, THORACIC SPINE WITH AND WITHOUT CONTRAST MR LUMBAR SPINE WITHOUT TECHNIQUE: Multiplanar and multiecho pulse sequences of the cervical spine, to include the craniocervical junction and cervicothoracic junction, and thoracic with and without contrast. Multiplanar and multi echo pulse sequences of the lumbar spine, were obtained without intravenous contrast. CONTRAST:  15 cc MultiHance COMPARISON:  CT chest May 19, 2014 and MRI of lumbar spine February 26, 2008 FINDINGS: MRI CERVICAL SPINE FINDINGS ALIGNMENT: Straightened cervical lordosis.  No malalignment. VERTEBRAE/DISCS: Vertebral bodies are intact. Moderate to severe C3-4  thru C6-7 disc height loss of with moderate chronic discogenic endplate changes and disc desiccation. No suspicious bone marrow signal. No suspicious osseous or intradiscal enhancement. CORD:Expansile T2 bright signal and patchy varying enhancement predominately involving the posterior columns C2 through C4-5 and more central within the spinal cord at C5 through C7-T1. No myelomalacia. No syrinx. No abnormal leptomeningeal or epidural enhancement. POSTERIOR FOSSA, VERTEBRAL ARTERIES, PARASPINAL TISSUES: No MR findings of ligamentous injury. Vertebral artery flow voids present. Included posterior fossa and paraspinal soft tissues are normal. DISC LEVELS: C2-3: Small central disc protrusion. Mild facet arthropathy without canal stenosis or neural foraminal narrowing. C3-4: 2 mm broad-based disc bulge, uncovertebral hypertrophy and mild facet arthropathy. Mild canal stenosis. Moderate RIGHT, mild to moderate LEFT neural foraminal narrowing. C4-5: Annular bulging, uncovertebral hypertrophy and mild facet arthropathy. Mild canal stenosis. Moderate RIGHT and moderate to severe LEFT neural foraminal narrowing. C5-6: Broad-based disc bulge and LEFT central disc protrusion in total measure 3 mm in AP dimension. Moderate to severe canal stenosis, AP dimension the canal is 6 mm. Ventral cord  deformity. Severe RIGHT and moderate to severe LEFT neural foraminal narrowing. C6-7: 2 mm broad-based disc bulge, uncovertebral hypertrophy and mild facet arthropathy. Moderate canal stenosis. Moderate to severe bilateral neural foraminal narrowing. C7-T1: No disc bulge, canal stenosis nor neural foraminal narrowing. MRI THORACIC SPINE FINDINGS ALIGNMENT: Maintenance of the thoracic kyphosis. No malalignment. VERTEBRAE/DISCS: Vertebral bodies are intact. Old minimally displaced sternal fracture. Intervertebral discs morphology and signal are normal. Multilevel mild chronic discogenic endplate changes/spurring. Moderate subacute on chronic discogenic endplate changes [id number]. Heterogeneous T1, bright STIR probable hemangioma T2, 10 mm. Additional large L1 hemangioma. No suspicious osseous or intradiscal enhancement. CORD: Patchy mildly expansile central spinal cord T2 hyperintense signal and varying enhancement T1-2 through T2, T5-6 through T7-8 with predominant dorsal cord involvement. Patchy T2 hyperintense signal within the conus medullaris. No syrinx. No abnormal leptomeningeal or epidural enhancement. No myelomalacia. PREVERTEBRAL AND PARASPINAL SOFT TISSUES:  Normal. DISC LEVELS: At T5-6 is 3 mm RIGHT central disc protrusion/extrusion without migration, contacting the ventral spinal cord without canal stenosis or neural foraminal narrowing at any level. MRI LUMBAR SPINE FINDINGS SEGMENTATION: For the purposes of this report, the last well-formed intervertebral disc will be described as L5-S1. ALIGNMENT: Maintenance of the lumbar lordosis. Grade 1 L4-5 anterolisthesis is similar, no spondylolysis. VERTEBRAE:Vertebral bodies are intact. Mild L4-5 and L5-S1 disc height loss is similar with decreased T2 signal within the lower lumbar disc compatible with mild desiccation. Mild chronic discogenic endplate changes [id number] and L5-S1. No abnormal bone marrow signal. No suspicious bone marrow signal. Large L1 hemangioma  with additional scattered small hemangiomas. CONUS MEDULLARIS: Conus medullaris terminates at L1-2. Cauda equina is normal. PARASPINAL AND SOFT TISSUES: Extremely distended urinary bladder. DISC LEVELS: L1-2: Small broad-based LEFT central disc protrusion, interval re- absorption of extrusion. No canal stenosis or neural foraminal narrowing. L2-3: No disc bulge, canal stenosis nor neural foraminal narrowing. L3-4: Annular bulging. Moderate facet arthropathy and ligamentum flavum redundancy with trace facet effusions which are likely reactive. No canal stenosis. Mild neural foraminal narrowing. L4-5: Anterolisthesis. Small broad-based disc bulge. Severe facet arthropathy and ligamentum flavum redundancy with trace facet effusions which are likely reactive. Mild canal stenosis including partial effacement lateral recesses which may affect the traversing L5 nerves. Moderate to severe RIGHT, moderate LEFT neural foraminal narrowing. L5-S1: Small broad-based disc bulge. Moderate to severe RIGHT, moderate LEFT facet arthropathy. Trace RIGHT facet effusion is likely reactive. No canal stenosis. Mild LEFT neural  foraminal narrowing. IMPRESSION: MRI CERVICAL SPINE: Abnormal spinal cord signal and enhancement without discrete mass. 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 Recommend correlation with cerebral spinal fluid studies. Degenerative cervical spine results in moderate to severe canal stenosis C5-6, moderate at C6-7. Neural foraminal narrowing C3-4 thru C6-7: Severe on the RIGHT at C5-6, moderate to severe multiple levels. MRI THORACIC SPINE: Abnormal spinal cord signal and enhancement without discrete mass. 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 Recommend correlation with cerebral spinal fluid studies. Small T5-6 disc protrusion/extrusion without canal stenosis or neural foraminal narrowing. MRI LUMBAR SPINE: Re- absorption of L1-2 disc extrusion with small residual protrusion.  Degenerative lumbar spine resulting in mild canal stenosis L4-5, similar grade 1 L4-5 anterolisthesis on degenerative basis. Neural foraminal narrowing L3-4 through L5-S1: Moderate to severe on the RIGHT at L4-5. Preliminary acute findings discussed with and reconfirmed by PA.JAIME WARD on 09/20/2016 at 5:35 pm. Electronically Signed   By: Elon Alas M.D.   On: 09/20/2016 19:19   Dg Fluoro Guide Lumbar Puncture  Result Date: 09/22/2016 CLINICAL DATA:  Myelitis. EXAM: DIAGNOSTIC LUMBAR PUNCTURE UNDER FLUOROSCOPIC GUIDANCE FLUOROSCOPY TIME:  Fluoroscopy Time:  12 seconds Radiation Exposure Index (if provided by the fluoroscopic device): 21.25 microGray*m^2 Number of Acquired Spot Images: 0 PROCEDURE: Informed consent was obtained from the patient prior to the procedure, including potential complications of headache, allergy, and pain. With the patient prone, the lower back was prepped with Betadine. 1% Lidocaine was used for local anesthesia. Lumbar puncture was performed at the L3-4 level using a 3.5 inch 20 gauge needle via a right interlaminar approach with return of clear CSF with an opening pressure of 9 cm water (measured in the left lateral decubitus position). 12 ml of CSF were obtained for laboratory studies. The patient tolerated the procedure well and there were no apparent complications. IMPRESSION: Successful fluoroscopic guided lumbar puncture. Electronically Signed   By: Logan Bores M.D.   On: 09/22/2016 18:21    Time Spent in minutes  [place].D on 09/24/2016 at 2:37 PM  Between 7am to 7pm - Pager - [phone]  After 7pm go to [email or web address] - password [facility]  [facility] -  Office  [phone]

## 2016-09-24 NOTE — Progress Notes (Signed)
Pharmacy Antibiotic Note  Katelyn Lamb is a 71 y.o. female admitted on 09/20/2016 with lower extremity weakness.  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  She has a history of genital herpes many years ago.  Her renal function has been stable.   Plan: - Continue Acyclovir 650mg  IV Q8H (~10 mg/kg TBW) - Continue to monitor renal fxn, clinical progress, work-up   Height: 5\' 6"  (167.6 cm) Weight: 143 lb 3.2 oz (65 kg) IBW/kg (Calculated) : 59.3  Temp (24hrs), Avg:97.6 F (36.4 C), Min:97.5 F (36.4 C), Max:97.6 F (36.4 C)   Recent Labs Lab 09/20/16 1420 09/21/16 0655 09/22/16 0435 09/23/16 0348 09/24/16 0452  WBC 10.9* 8.8  --  15.7* 11.4*  CREATININE 1.00 0.84 0.70 0.78 0.75    Estimated Creatinine Clearance: 63 mL/min (by C-G formula based on SCr of 0.75 mg/dL).    Allergies  Allergen Reactions  . Amoxicillin-Pot Clavulanate Other (See Comments)  . Demerol [Meperidine]     Hallucinations  . 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 Mina Marble, PharmD, BCPS Pager:  [phone] 09/24/2016, 1:01 PM

## 2016-09-24 NOTE — Progress Notes (Signed)
Physical Therapy Treatment Patient Details Name: Katelyn Lamb MRN: [id number] DOB: 09/25/1945 Today's Date: 09/24/2016    History of Present Illness Pt adm with progressive lower extremity weakness and incontinence. Pt found to have subacute progressive myelopathy with enhancing lesions most consistent with myelitis. Further work up in progress. Pt also with shingles. PMH - anxiety, back pain.    PT Comments    Patient is making progress toward mobility goals. Pt tolerated increased gait distance and bilat LE therex this session. Pt continues to c/o decreased sensation/numbness and heaviness in bilat feet and hands. Pt demonstrated slightly greater weakness L LE vs R LE. Pt educated on calf stretch as she c/o tightness with DF. Continue to progress as tolerated with anticipated d/c home with HHPT.   Follow Up Recommendations  Home health PT;Supervision for mobility/OOB     Equipment Recommendations  Rolling walker with 5" wheels    Recommendations for Other Services       Precautions / Restrictions Precautions Precautions: Fall Restrictions Weight Bearing Restrictions: No    Mobility  Bed Mobility Overal bed mobility: Modified Independent Bed Mobility: Supine to Sit           General bed mobility comments: increased time and effort  Transfers Overall transfer level: Needs assistance Equipment used: Rolling walker (2 wheeled) Transfers: Sit to/from [facility] Sit to Stand: Min guard         General transfer comment: min guard for safety; no unsteadiness noted upon standing although does continue to c/o numbness in feet and hands  Ambulation/Gait Ambulation/Gait assistance: Min guard Ambulation Distance (Feet): 70 Feet Assistive device: Rolling walker (2 wheeled) Gait Pattern/deviations: Step-through pattern;Decreased stride length;Decreased dorsiflexion - left;Trunk flexed Gait velocity: decreased   General Gait Details: min guard for safety;  slow, grossly steady gait with LOB when attempting to bend forward and point to knee with assistance needed to steady; cues for bilat heel strike   Stairs            Wheelchair Mobility    Modified Rankin (Stroke Patients Only)       Balance Overall balance assessment: Needs assistance   Sitting balance-Leahy Scale: Good       Standing balance-Leahy Scale: Poor                      Cognition Arousal/Alertness: Awake/alert Behavior During Therapy: WFL for tasks assessed/performed;Anxious Overall Cognitive Status: Within Functional Limits for tasks assessed                      Exercises General Exercises - Lower Extremity Ankle Circles/Pumps: AROM;Both;15 reps Long Arc Quad: AROM;Both;15 reps Hip ABduction/ADduction: AROM;Both;15 reps Straight Leg Raises: AROM;Both;15 reps Hip Flexion/Marching: AROM;Both;15 reps Toe Raises: AROM;Both;15 reps    General Comments        Pertinent Vitals/Pain Pain Assessment: No/denies pain    Home Living                      Prior Function            PT Goals (current goals can now be found in the care plan section) Acute Rehab PT Goals Patient Stated Goal: be active again PT Goal Formulation: With patient Time For Goal Achievement: 09/28/16 Potential to Achieve Goals: Good Progress towards PT goals: Progressing toward goals    Frequency    Min 3X/week      PT Plan Current plan remains appropriate  Co-evaluation             End of Session Equipment Utilized During Treatment: Gait belt Activity Tolerance: Patient tolerated treatment well Patient left: in chair;with call bell/phone within reach;Other (comment) (Neuro MD present)     Time: [id number] PT Time Calculation (min) (ACUTE ONLY): 31 min  Charges:  $Gait Training: 8-22 mins $Therapeutic Exercise: 8-22 mins                    G Codes:      Salina April, PTA Pager: [phone]   09/24/2016, 10:46 AM

## 2016-09-24 NOTE — Progress Notes (Signed)
Neurology Progress Note  I have reviewed the patient's chart at length. In brief, this is a 58-yo RH woman who initially presented to the ED on 09/20/16 for evaluation of LE weakness. She reports back pain with gradually worsening weakness and numbness in both legs as well as some urinary retention for about two weeks. She first noted numbness on 09/04/16. She was initially seen in the ED on 09/09/16 c/o pain in the left lower back with radiation to the buttocks and LLE, at that time reporting no change in bowel/bladder and no weakness. It was felt that this was lumbar pain with radiculopathy based on h/o same and she was discharged home. She was seen as an outpatient by ortho with MRI L spine 1/12 showing severe stenosis. She was started on gabapentin, prednisone, and Dilaudid. Due to uncontrolled pain, she returned to the ED on 1/13 with no mention of neuro deficits on documented exam. She was discharged home with Valium for muscle spasm. On presentation to the ED on 1/18, she c/o tingling in both legs with pain from the breasts down. She was found to have weakness in BLE. Neurologic consultation was obtained and she was noted to have 4/5 strength in BLE with decreased sensation in BLE diffusely and in BUE below the elbow. She was also noted to have small vesicular lesions on the L shoulder and L leg. MRI of the cervical and thoracic spine showed patchy areas of hyperintensity with enhancement. MRI brain showed hyperintensity in the paramedian frontal and cingulate gyri concerning for encephalitis. LP was performed and showed a mononuclear pleocytosis with a positive HSV-2 PCR. She was started IV Solumedrol and acyclovir. She has been documented to have improvement in neurologic deficits since admission.   Subjective: She has no new complaints today. She continues to have tingling in both hands that makes it difficult for her to perform fine motor tasks. She also reports ongoing tingling in her legs. She feels weak  in her left leg today. Foley catheter remains in place. She notes significant improvement in her back pain. She has been eating and sleeping well.   Current Meds:   Current Facility-Administered Medications:  .  0.9 %  sodium chloride infusion, , Intravenous, Continuous, Reubin Milan, MD, Last Rate: 75 mL/hr at 09/24/16 0941 .  acyclovir (ZOVIRAX) 650 mg in dextrose 5 % 100 mL IVPB, 10 mg/kg, Intravenous, Q8H, Erenest Blank, RPH, 650 mg at 09/24/16 0551 .  ALPRAZolam Duanne Moron) tablet 1 mg, 1 mg, Oral, QHS PRN, Reubin Milan, MD, 1 mg at 09/23/16 0320 .  calcium carbonate (OS-CAL - dosed in mg of elemental calcium) tablet 500 mg of elemental calcium, 1 tablet, Oral, Q breakfast, Reubin Milan, MD, 500 mg of elemental calcium at 09/24/16 0810 .  cholecalciferol (VITAMIN D) tablet 1,000 Units, 1,000 Units, Oral, Daily, Reubin Milan, MD, 1,000 Units at 09/24/16 0941 .  diazepam (VALIUM) tablet 5 mg, 5 mg, Oral, BID, Reubin Milan, MD, 5 mg at 09/24/16 0941 .  diphenhydrAMINE (BENADRYL) injection 12.5 mg, 12.5 mg, Intravenous, Q4H PRN, Reubin Milan, MD .  gabapentin (NEURONTIN) capsule 100 mg, 100 mg, Oral, TID, Reubin Milan, MD, 100 mg at 09/24/16 0941 .  HYDROcodone-acetaminophen (NORCO) 10-325 MG per tablet 1 tablet, 1 tablet, Oral, Q4H PRN, Reubin Milan, MD, 1 tablet at 09/24/16 1036 .  ketorolac (TORADOL) 15 MG/ML injection 15 mg, 15 mg, Intravenous, Q6H PRN, Reubin Milan, MD, 15 mg at 09/23/16 0936 .  methylPREDNISolone sodium succinate (SOLU-MEDROL) 500 mg in sodium chloride 0.9 % 50 mL IVPB, 500 mg, Intravenous, Q12H, Greta Doom, MD, 500 mg at 09/24/16 0945 .  ondansetron (ZOFRAN) tablet 4 mg, 4 mg, Oral, Q6H PRN **OR** ondansetron (ZOFRAN) injection 4 mg, 4 mg, Intravenous, Q6H PRN, Reubin Milan, MD .  sodium chloride flush (NS) 0.9 % injection 10-40 mL, 10-40 mL, Intracatheter, PRN, Jani Gravel, MD  Objective:  Temp:  [97.5 F  (36.4 C)-97.6 F (36.4 C)] 97.6 F (36.4 C) (01/22 0418) Pulse Rate:  [70-77] 70 (01/22 0418) Resp:  [18-19] 19 (01/22 0418) BP: (120-135)/(67-81) 132/81 (01/22 0418) SpO2:  [96 %] 96 % (01/22 0418)  General: WDWN woman in NAD. Alert, oriented x4. Speech is clear without dysarthria. Affect is bright. Comportment is normal.  HEENT: Neck is supple without lymphadenopathy. Mucous membranes are moist and the oropharynx is clear. Sclerae are anicteric. There is no conjunctival injection.  CV: Regular, no murmur. Carotid pulses are 2+ and symmetric with no bruits. Distal pulses 2+ and symmetric.   Extremities: No C/C/E. Neuro: MS: As noted above. No aphasia.  CN: Pupils are equal and reactive from 3-->2 mm bilaterally. EOMI, no nystagmus. Facial sensation is intact to light touch. Face is symmetric at rest with normal strength and mobility. Hearing is intact to conversational voice. Voice is normal in tone and quality. Palate elevates symmetrically. Uvula is midline. Bilateral SCM and trapezii are 5/5. Tongue is midline with normal bulk and mobility.  Motor: Normal bulk, tone, and strength throughout with the exception of 4+/5 strength L PF/DF, KE, and HF. No pronator drift. No tremor or other abnormal movements are observed.  Sensation: Intact to light touch, pinprick and vibration. She reports dysesthesias in the arms.   DTRs: 3+ BUE and both knees. Toes are mute bilaterally. No pathological reflexes.  Coordination: Finger-to-nose and heel-to-shin are without dysmetria bilaterally.     Labs: Lab Results  Component Value Date   WBC 11.4 (H) 09/24/2016   HGB 12.6 09/24/2016   HCT 37.1 09/24/2016   PLT 174 09/24/2016   GLUCOSE 167 (H) 09/24/2016   ALT 30 09/24/2016   AST 30 09/24/2016   NA 136 09/24/2016   K 3.3 (L) 09/24/2016   CL 102 09/24/2016   CREATININE 0.75 09/24/2016   BUN 13 09/24/2016   CO2 25 09/24/2016   CBC Latest Ref Rng & Units 09/24/2016 09/23/2016 09/21/2016  WBC 4.0 -  10.5 K/uL 11.4(H) 15.7(H) 8.8  Hemoglobin 12.0 - 15.0 g/dL 12.6 13.3 14.7  Hematocrit 36.0 - 46.0 % 37.1 39.4 43.5  Platelets 150 - 400 K/uL 174 210 221    No results found for: HGBA1C Lab Results  Component Value Date   ALT 30 09/24/2016   AST 30 09/24/2016   ALKPHOS 54 09/24/2016   BILITOT 0.4 09/24/2016   CSF wbc 490 (86% lymphs, 14% monos) CSF rbc 34 CSF protein 53 CSF glucose 96 CSF HSV-1 PCR negative CSF HSV-2 PCR positive CSF VZV PCR pending CSF VDRL pending CSF oligoclonal bands pending CSF cx negative  NMO Abs pending RF <10 ANCA pending SS-A, SS-B Abs negative ANA pending ACE 24 B12 569 HIV nonreactive   Radiology:  I have personally and independently reviewed the MRI of the cervical and thoracic spine from 09/20/16. This shows patchy areas of T2 hyperintensity extending from C2-T1. There is irregular patchy enhancement of this lesion without significant edema. There are disk bulges noted throughout the cervical spine, greatest at C5-6 where  there is moderate to severe canal stenosis.   I have personally and independently reviewed the MRI brain without contrast from 09/20/16.  This shows a moderate burden of patchy T2/FLAIR hyperintense lesions throughout the bihemispheric white matter, most suggestive of chronic small vessel disease. Also noted are focal areas of cortical hyperintensity in the paramedian and cingulate gyri with associated restricted diffusion and mild enhancement, concerning for an acute inflammatory process.   A/P:   1. Myelitis: She has a cervical and thoracic myelitis. Workup thus far is notable for inflammatory CSF with a positive HSV-2 PCR. While rare, HSV-2 has been associated with both myelitis and encephalitis. The patient reports a history of genital herpes many years ago, no recent outbreaks or lesions reported. VZV PCR is still pending--this could also produce her symptoms. She is now on day #4/21 of iv acyclovir which will treat both.  Continue to monitor daily BUN/creatinine while on acyclovir. She is on day #4/5 of IV Solumedrol.   2. Encephalitis: She had patchy areas of enhancement in the frontal lobes consistent with encephalitis. This can be seen with both HSV-2 and VZV as above. Continue IV acyclovir for total of 21 days as above.  3. BLE weakness: This is acute, due to myelitis. Symptoms have improved. Continue Solumedrol and acyclovir as above. Continue PT, appreciate assistance.   4. Dysesthesias: She has tingling in both hands and both legs, c/w myelitis. Continue solumedrol, acyclovir, PT/OT.    This was discussed with the patient who is in agreement with the plan as noted. She was given the opportunity to ask any questions and these were addressed to her satisfaction.   Melba Coon, MD [facility]

## 2016-09-24 NOTE — Progress Notes (Signed)
[facility]  New IV Acyclovir pt for home with [facility].  [facility] will provide HHRN, PT and Home Infusion Phamacy services for pt upon DC.  [facility] will provide in hospital teaching to support independence at home upon DC.  [facility] hospital team will follow and support transition home when ordered.  If patient discharges after hours, please call [phone].   Larry Sierras 09/24/2016, 11:06 AM

## 2016-09-24 NOTE — Care Management Note (Signed)
Case Management Note  Patient Details  Name: CORBIN PIZZOFERRATO MRN: [id number] Date of Birth: [birth year]  Subjective/Objective:                    Action/Plan:  Discussed home health with patient and grand daughter at bedside.   They would like [facility]. Will need home health orders and face to face for RN and PT.   PCP is Dr [name] at [facility] [place] .  Patient's husband plans on assisting patient at home with IV ABX  Expected Discharge Date:                  Expected Discharge Plan:  [place]  In-House Referral:  NA  Discharge planning Services  CM Consult  Post Acute Care Choice:  Home Health Choice offered to:  Patient  DME Arranged:  [facility] rolling DME Agency:     HH Arranged:  PT, RN [place] Agency:  [facility]  Status of Service:  In process, will continue to follow  If discussed at Long Length of Stay Meetings, dates discussed:    Additional Comments:  Marilu Favre, RN 09/24/2016, 12:58 PM

## 2016-09-25 LAB — BASIC METABOLIC PANEL
Anion gap: 6 (ref 5–15)
BUN: 14 mg/dL (ref 6–20)
CALCIUM: 9 mg/dL (ref 8.9–10.3)
CO2: 27 mmol/L (ref 22–32)
CREATININE: 0.73 mg/dL (ref 0.44–1.00)
Chloride: 101 mmol/L (ref 101–111)
GFR calc Af Amer: >60 mL/min (ref >60)
GFR calc non Af Amer: >60 mL/min (ref >60)
GLUCOSE: 131 mg/dL — AB (ref 65–99)
Potassium: 4.9 mmol/L (ref 3.5–5.1)
Sodium: 134 mmol/L — ABNORMAL LOW (ref 135–145)

## 2016-09-25 LAB — ANCA TITERS: P-ANCA: <1:20 {titer}

## 2016-09-25 LAB — CBC
HEMATOCRIT: 37.9 % (ref 36.0–46.0)
Hemoglobin: 13 g/dL (ref 12.0–15.0)
MCH: 31.6 pg (ref 26.0–34.0)
MCHC: 34.3 g/dL (ref 30.0–36.0)
MCV: 92 fL (ref 78.0–100.0)
PLATELETS: 194 10*3/uL (ref 150–400)
RBC: 4.12 MIL/uL (ref 3.87–5.11)
RDW: 12.5 % (ref 11.5–15.5)
WBC: 12.2 10*3/uL — ABNORMAL HIGH (ref 4.0–10.5)

## 2016-09-25 LAB — OLIGOCLONAL BANDS, CSF + SERM

## 2016-09-25 LAB — VARICELLA-ZOSTER BY PCR: VARICELLA-ZOSTER, PCR: NEGATIVE

## 2016-09-25 NOTE — Progress Notes (Signed)
Patient ID: Katelyn Lamb, female   DOB: June 05, 1946, 71 y.o.   MRN: [id number]                                                                PROGRESS NOTE                                                                                                                                                                                                             Patient Demographics:    Katelyn Lamb, is a 71 y.o. female, DOB - 1946/01/27, [id number] Admit date - 09/20/2016   Admitting Physician Reubin Milan, MD Outpatient Primary MD for the patient is No PCP Per Patient  LOS - 4  Outpatient Specialists:   Chief Complaint  Patient presents with  . Back Pain       Brief Narrative   71 y.o.femalewith medical history significant of anxiety and chronic back pain who is coming to the emergency department with progressively worse lower back pain and numbness that radiates to her lower extremities since 09/04/2016. She had left sided several vesicular dermatomal lesions on admission. MRI is suspicious for myelitis.   Probable transverse myelitis. MRI c/t/l spine: Abnormal spinal cord signal and enhancement without discrete mass. 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 Recommend correlation with cerebral spinal fluid studies. MRI brain: Cortical diffusion and FLAIR signal abnormality with some increased sulcal FLAIR signal centered in the anterior paramedian frontal and cingulate gyri. Findings are most consistent with a primary encephalitis with a component of meningitis, possibly reactive - On iv steroids, cont acyclovir iv, s/p LP 09/22/2016    Subjective:   Continue to have numbness and tingling in her legs and hands. Very hesitant when she gets up and walk around.   Assessment  & Plan :    Principal Problem:   Numbness of left lower extremity Active Problems:   Intractable back pain   Hyponatremia   Herpes zoster   Abnormal MRI, spinal cord   Encephalitis and  encephalomyelitis   Myelitis (HCC)   Numbness    Rule out transverse myelitis/encephalitis Patient is on IV acyclovir and IV steroids, per neurology continue acyclovir for total of 3 weeks. 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 Per neurology recommendation acyclovir for total of 3 weeks. PT recommended [facility] services with walker.  Hyponatremia, mild Check CMP in am  Rash likely to to zoster Cont acyclovir and contact isolation  Hypokalemia Replete, check cmp in am  Leukocytosis Secondary to steroids   Code Status : FULL CODE Family Communication  : w patient Disposition Plan  : home Barriers For Discharge :  Consults  :  neurology  Procedures  : LP 1/20  DVT Prophylaxis  :  Lovenox - SCDs   Lab Results  Component Value Date   PLT 194 09/25/2016    Antibiotics  :    Anti-infectives    Start     Dose/Rate Route Frequency Ordered Stop   09/21/16 0500  acyclovir (ZOVIRAX) 650 mg in dextrose 5 % 100 mL IVPB     10 mg/kg  65 kg 113 mL/hr over 60 Minutes Intravenous Every 8 hours 09/21/16 0458     09/20/16 2200  valACYclovir (VALTREX) tablet 500 mg  Status:  Discontinued     500 mg Oral 2 times daily 09/20/16 2106 09/21/16 0453        Objective:   Vitals:   09/24/16 0418 09/24/16 1321 09/24/16 2141 09/25/16 0500  BP: 132/81 122/73 136/74 128/84  Pulse: 70 83 86 74  Resp: 19 18 17 16   Temp: 97.6 F (36.4 C) 98 F (36.7 C) 98.1 F (36.7 C) 98.1 F (36.7 C)  TempSrc: Oral Oral Oral   SpO2: 96% 96% 95% 97%  Weight:      Height:        Wt Readings from Last 3 Encounters:  09/20/16 65 kg (143 lb 3.2 oz)  06/24/14 67.9 kg (149 lb 12.8 oz)  05/19/14 65.8 kg (145 lb)     Intake/Output Summary (Last 24 hours) at 09/25/16 1336 Last data filed at 09/25/16 0733  Gross per 24 hour  Intake              459 ml  Output             3200 ml  Net            -2741 ml     Physical  Exam  Awake Alert, Oriented X 3, No new F.N deficits, Normal affect Harper.AT,PERRAL Supple Neck,No JVD, No cervical lymphadenopathy appriciated.  Symmetrical Chest wall movement, Good air movement bilaterally, CTAB RRR,No Gallops,Rubs or new Murmurs, No Parasternal Heave +ve B.Sounds, Abd Soft, No tenderness, No organomegaly appriciated, No rebound - guarding or rigidity. No Cyanosis, Clubbing or edema,  Vesicular confluent rash on the left groin, left lateral leg   Data Review:    CBC  Recent Labs Lab 09/20/16 1420 09/21/16 0655 09/23/16 0348 09/24/16 0452 09/25/16 0508  WBC 10.9* 8.8 15.7* 11.4* 12.2*  HGB 15.8* 14.7 13.3 12.6 13.0  HCT 47.4* 43.5 39.4 37.1 37.9  PLT 272 221 210 174 194  MCV 95.0 93.5 91.8 92.8 92.0  MCH 31.7 31.6 31.0 31.5 31.6  MCHC 33.3 33.8 33.8 34.0 34.3  RDW 12.9 12.8 12.5 12.6 12.5  LYMPHSABS 2.5 1.0  --   --   --   MONOABS 0.9 0.0*  --   --   --   EOSABS 0.1 0.0  --   --   --   BASOSABS 0.0 0.0  --   --   --     Chemistries   Recent Labs Lab 09/21/16 0655 09/22/16 0435 09/23/16 0348 09/24/16  [id number] 09/25/16 0508  NA 131* 134* 138 136 134*  K 4.3 4.6 3.4* 3.3* 4.9  CL 99* 104 103 102 101  CO2 20* 18* 24 25 27   GLUCOSE 117* 144* 144* 167* 131*  BUN 19 15 16 13 14   CREATININE 0.84 0.70 0.78 0.75 0.73  CALCIUM 9.0 8.8* 9.0 8.7* 9.0  AST 34  --  31 30  --   ALT 34  --  29 30  --   ALKPHOS 62  --  56 54  --   BILITOT 1.1  --  0.6 0.4  --    ------------------------------------------------------------------------------------------------------------------ No results for input(s): CHOL, HDL, LDLCALC, TRIG, CHOLHDL, LDLDIRECT in the last 72 hours.  No results found for: HGBA1C ------------------------------------------------------------------------------------------------------------------ No results for input(s): TSH, T4TOTAL, T3FREE, THYROIDAB in the last 72 hours.  Invalid input(s):  FREET3 ------------------------------------------------------------------------------------------------------------------ No results for input(s): VITAMINB12, FOLATE, FERRITIN, TIBC, IRON, RETICCTPCT in the last 72 hours.  Coagulation profile No results for input(s): INR, PROTIME in the last 168 hours.  No results for input(s): DDIMER in the last 72 hours.  Cardiac Enzymes No results for input(s): CKMB, TROPONINI, MYOGLOBIN in the last 168 hours.  Invalid input(s): CK ------------------------------------------------------------------------------------------------------------------ No results found for: BNP  Inpatient Medications  Scheduled Meds: . acyclovir  10 mg/kg Intravenous Q8H  . calcium carbonate  1 tablet Oral Q breakfast  . cholecalciferol  1,000 Units Oral Daily  . diazepam  5 mg Oral BID  . gabapentin  100 mg Oral TID   Continuous Infusions:  PRN Meds:.ALPRAZolam, diphenhydrAMINE, HYDROcodone-acetaminophen, ketorolac, ondansetron **OR** ondansetron (ZOFRAN) IV, sodium chloride flush  Micro Results Recent Results (from the past 240 hour(s))  CSF culture     Status: None (Preliminary result)   Collection Time: 09/22/16  5:57 PM  Result Value Ref Range Status   Specimen Description CSF  Final   Special Requests NONE  Final   Gram Stain   Final    WBC PRESENT, PREDOMINANTLY MONONUCLEAR NO ORGANISMS SEEN CYTOSPIN SMEAR    Culture NO GROWTH 3 DAYS  Final   Report Status PENDING  Incomplete    Radiology Reports Dg Lumbar Spine Complete  Result Date: 09/09/2016 CLINICAL DATA:  Low back pain with left leg pain EXAM: LUMBAR SPINE - COMPLETE 4+ VIEW COMPARISON:  Lumbar MRI 02/26/2008 FINDINGS: 5 mm anterior slip L4-L5 has progressed in the interval. Remaining alignment normal. Negative for fracture or pars defect. Facet degeneration at L4-5. Disc spaces intact. IMPRESSION: Grade 1 anterolisthesis L4-5 has progressed since 2009. No acute abnormality Electronically Signed    By: Franchot Gallo M.D.   On: 09/09/2016 18:59   Mr Brain Wo Contrast  Result Date: 09/21/2016 CLINICAL DATA:  71 y/o F; worsening back pain and lower extremity weakness/numbness. EXAM: MRI HEAD WITHOUT CONTRAST TECHNIQUE: Multiplanar, multiecho pulse sequences of the brain and surrounding structures were obtained without intravenous contrast. COMPARISON:  09/20/2016 cervical MRI.  08/01/2016 MRI of the brain. FINDINGS: Brain: Increased T2 FLAIR signal within the right paramedian frontal sulci (series 12, image 157). Within the bilateral paramedian cingulate and right frontal lobes in the region of incomplete FLAIR suppression there is cortical diffusion restriction and increased T2 FLAIR hyperintense signal (series 7 image 17 and series 5, image 26). Humerus T2 FLAIR hyperintense foci in white matter in both periventricular and subcortical white matter are similar in distribution in comparison with the prior MRI. On the sagittal T2 FLAIR volumetric sequence there are foci of signal abnormality at the cervicomedullary junction  and upper cervical cord better characterized on the prior cervical MRI. No abnormal susceptibility hypointensity. No focal mass effect. No hydrocephalus. Vascular: Normal flow voids. Skull and upper cervical spine: Normal marrow signal. Sinuses/Orbits: Right mastoid effusion. No abnormal signal of paranasal sinuses or left mastoid air cell. Bilateral intra-ocular lens replacement. Other: None. IMPRESSION: 1. Cortical diffusion and FLAIR signal abnormality with some increased sulcal FLAIR signal centered in the anterior paramedian frontal and cingulate gyri. 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 The pattern of meningeal and cortical inflammation is not typical of multiple sclerosis or neuromyelitis optica favoring an infectious or other autoimmune etiology. Consider follow-up with MRI of the brain with contrast. 2.  Stable nonspecific T2 FLAIR hyperintense white matter foci may represent microvascular ischemic changes particularly in the setting of diabetes or hypertension or sequelae of demyelination, vasculitis, and other infectious/inflammatory processes. Electronically Signed   By: Kristine Garbe M.D.   On: 09/21/2016 02:57   Mr Jeri Cos [id number] Contrast  Result Date: 09/23/2016 CLINICAL DATA:  71 y/o  F; encephalitis. EXAM: MRI HEAD WITHOUT AND WITH CONTRAST TECHNIQUE: Multiplanar, multiecho pulse sequences of the brain and surrounding structures were obtained without and with intravenous contrast. CONTRAST:  65mL MULTIHANCE GADOBENATE DIMEGLUMINE 529 MG/ML IV SOLN COMPARISON:  09/21/2016 MRI of the brain. FINDINGS: Brain: Cortical T2 FLAIR hyperintense signal abnormality with diffusion restriction in the paramedian frontal and cingulate gyri is stable in distribution in comparison with the prior MRI of the brain. Sulcal increased FLAIR signal is largely resolved with minimal residual in the right paramedian frontal lobe (series 6, image 18). After administration of intravenous contrast there is faint cortical enhancement which corresponds best to diffusion restricting cortex (series 8, image 28 and series 13, image 23). There is no appreciable leptomeningeal enhancement. There are stable foci of T2 FLAIR hyperintense signal abnormality in subcortical and periventricular white matter from prior MRIs. There is no new focus of diffusion restriction, focal mass effect, FLAIR signal abnormality, or susceptibility hypointensity. There is no additional abnormal enhancement of the brain. Vascular: Normal flow voids. Skull and upper cervical spine: Normal marrow signal. Sinuses/Orbits: Stable right mastoid effusion. No abnormal signal of paranasal sinuses. Orbits are unremarkable. Other: None. IMPRESSION: 1. The region of paramedian frontal and cingulate gyrus cortical T2 FLAIR and diffusion signal abnormality is stable  and demonstrates mild enhancement. Sulcal FLAIR signal abnormality has largely resolved and there is no leptomeningeal enhancement. Findings are consistent with encephalitis, probably slightly improved given diminution in sulcal FLAIR signal abnormality. No new areas of encephalitis are identified. 2. No new signal abnormality of the brain parenchyma. 3. Stable background of nonspecific T2 FLAIR hyperintense white matter foci may represent microvascular ischemic changes particularly in the setting of diabetes or hypertension or sequelae of demyelination, vasculitis, and other infectious/inflammatory processes. Electronically Signed   By: Kristine Garbe M.D.   On: 09/23/2016 06:07   Mr Lumbar Spine Wo Contrast  Result Date: 09/20/2016 CLINICAL DATA:  Acute on chronic low back pain radiating to buttocks and LEFT lower extremity with numbness and tingling. On Vicodin with minimal relief. EXAM: MRI CERVICAL, THORACIC SPINE WITH AND WITHOUT CONTRAST MR LUMBAR SPINE WITHOUT TECHNIQUE: Multiplanar and multiecho pulse sequences of the cervical spine, to include the craniocervical junction and cervicothoracic junction, and thoracic with and without contrast. Multiplanar and multi echo pulse sequences of the lumbar spine, were obtained without intravenous contrast. CONTRAST:  15 cc MultiHance COMPARISON:  CT chest May 19, 2014 and MRI of lumbar spine February 26, 2008 FINDINGS: MRI CERVICAL SPINE FINDINGS ALIGNMENT: Straightened cervical lordosis.  No malalignment. VERTEBRAE/DISCS: Vertebral bodies are intact. Moderate to severe C3-4 thru C6-7 disc height loss of with moderate chronic discogenic endplate changes and disc desiccation. No suspicious bone marrow signal. No suspicious osseous or intradiscal enhancement. CORD:Expansile T2 bright signal and patchy varying enhancement predominately involving the posterior columns C2 through C4-5 and more central within the spinal cord at C5 through C7-T1. No  myelomalacia. No syrinx. No abnormal leptomeningeal or epidural enhancement. POSTERIOR FOSSA, VERTEBRAL ARTERIES, PARASPINAL TISSUES: No MR findings of ligamentous injury. Vertebral artery flow voids present. Included posterior fossa and paraspinal soft tissues are normal. DISC LEVELS: C2-3: Small central disc protrusion. Mild facet arthropathy without canal stenosis or neural foraminal narrowing. C3-4: 2 mm broad-based disc bulge, uncovertebral hypertrophy and mild facet arthropathy. Mild canal stenosis. Moderate RIGHT, mild to moderate LEFT neural foraminal narrowing. C4-5: Annular bulging, uncovertebral hypertrophy and mild facet arthropathy. Mild canal stenosis. Moderate RIGHT and moderate to severe LEFT neural foraminal narrowing. C5-6: Broad-based disc bulge and LEFT central disc protrusion in total measure 3 mm in AP dimension. Moderate to severe canal stenosis, AP dimension the canal is 6 mm. Ventral cord deformity. Severe RIGHT and moderate to severe LEFT neural foraminal narrowing. C6-7: 2 mm broad-based disc bulge, uncovertebral hypertrophy and mild facet arthropathy. Moderate canal stenosis. Moderate to severe bilateral neural foraminal narrowing. C7-T1: No disc bulge, canal stenosis nor neural foraminal narrowing. MRI THORACIC SPINE FINDINGS ALIGNMENT: Maintenance of the thoracic kyphosis. No malalignment. VERTEBRAE/DISCS: Vertebral bodies are intact. Old minimally displaced sternal fracture. Intervertebral discs morphology and signal are normal. Multilevel mild chronic discogenic endplate changes/spurring. Moderate subacute on chronic discogenic endplate changes [id number]. Heterogeneous T1, bright STIR probable hemangioma T2, 10 mm. Additional large L1 hemangioma. No suspicious osseous or intradiscal enhancement. CORD: Patchy mildly expansile central spinal cord T2 hyperintense signal and varying enhancement T1-2 through T2, T5-6 through T7-8 with predominant dorsal cord involvement. Patchy T2  hyperintense signal within the conus medullaris. No syrinx. No abnormal leptomeningeal or epidural enhancement. No myelomalacia. PREVERTEBRAL AND PARASPINAL SOFT TISSUES:  Normal. DISC LEVELS: At T5-6 is 3 mm RIGHT central disc protrusion/extrusion without migration, contacting the ventral spinal cord without canal stenosis or neural foraminal narrowing at any level. MRI LUMBAR SPINE FINDINGS SEGMENTATION: For the purposes of this report, the last well-formed intervertebral disc will be described as L5-S1. ALIGNMENT: Maintenance of the lumbar lordosis. Grade 1 L4-5 anterolisthesis is similar, no spondylolysis. VERTEBRAE:Vertebral bodies are intact. Mild L4-5 and L5-S1 disc height loss is similar with decreased T2 signal within the lower lumbar disc compatible with mild desiccation. Mild chronic discogenic endplate changes 075-GRM and L5-S1. No abnormal bone marrow signal. No suspicious bone marrow signal. Large L1 hemangioma with additional scattered small hemangiomas. CONUS MEDULLARIS: Conus medullaris terminates at L1-2. Cauda equina is normal. PARASPINAL AND SOFT TISSUES: Extremely distended urinary bladder. DISC LEVELS: L1-2: Small broad-based LEFT central disc protrusion, interval re- absorption of extrusion. No canal stenosis or neural foraminal narrowing. L2-3: No disc bulge, canal stenosis nor neural foraminal narrowing. L3-4: Annular bulging. Moderate facet arthropathy and ligamentum flavum redundancy with trace facet effusions which are likely reactive. No canal stenosis. Mild neural foraminal narrowing. L4-5: Anterolisthesis. Small broad-based disc bulge. Severe facet arthropathy and ligamentum flavum redundancy with trace facet effusions which are likely reactive. Mild canal stenosis including partial effacement lateral recesses which may affect the traversing L5 nerves. Moderate to severe RIGHT,  moderate LEFT neural foraminal narrowing. L5-S1: Small broad-based disc bulge. Moderate to severe RIGHT,  moderate LEFT facet arthropathy. Trace RIGHT facet effusion is likely reactive. No canal stenosis. Mild LEFT neural foraminal narrowing. IMPRESSION: MRI CERVICAL SPINE: Abnormal spinal cord signal and enhancement without discrete mass. 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 Recommend correlation with cerebral spinal fluid studies. Degenerative cervical spine results in moderate to severe canal stenosis C5-6, moderate at C6-7. Neural foraminal narrowing C3-4 thru C6-7: Severe on the RIGHT at C5-6, moderate to severe multiple levels. MRI THORACIC SPINE: Abnormal spinal cord signal and enhancement without discrete mass. 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 Recommend correlation with cerebral spinal fluid studies. Small T5-6 disc protrusion/extrusion without canal stenosis or neural foraminal narrowing. MRI LUMBAR SPINE: Re- absorption of L1-2 disc extrusion with small residual protrusion. Degenerative lumbar spine resulting in mild canal stenosis L4-5, similar grade 1 L4-5 anterolisthesis on degenerative basis. Neural foraminal narrowing L3-4 through L5-S1: Moderate to severe on the RIGHT at L4-5. Preliminary acute findings discussed with and reconfirmed by PA.JAIME WARD on 09/20/2016 at 5:35 pm. Electronically Signed   By: Elon Alas M.D.   On: 09/20/2016 19:19   Mr Cervical Spine W Wo Contrast  Result Date: 09/20/2016 CLINICAL DATA:  Acute on chronic low back pain radiating to buttocks and LEFT lower extremity with numbness and tingling. On Vicodin with minimal relief. EXAM: MRI CERVICAL, THORACIC SPINE WITH AND WITHOUT CONTRAST MR LUMBAR SPINE WITHOUT TECHNIQUE: Multiplanar and multiecho pulse sequences of the cervical spine, to include the craniocervical junction and cervicothoracic junction, and thoracic with and without contrast. Multiplanar and multi echo pulse sequences of the lumbar spine, were obtained without intravenous contrast. CONTRAST:  15 cc MultiHance  COMPARISON:  CT chest May 19, 2014 and MRI of lumbar spine February 26, 2008 FINDINGS: MRI CERVICAL SPINE FINDINGS ALIGNMENT: Straightened cervical lordosis.  No malalignment. VERTEBRAE/DISCS: Vertebral bodies are intact. Moderate to severe C3-4 thru C6-7 disc height loss of with moderate chronic discogenic endplate changes and disc desiccation. No suspicious bone marrow signal. No suspicious osseous or intradiscal enhancement. CORD:Expansile T2 bright signal and patchy varying enhancement predominately involving the posterior columns C2 through C4-5 and more central within the spinal cord at C5 through C7-T1. No myelomalacia. No syrinx. No abnormal leptomeningeal or epidural enhancement. POSTERIOR FOSSA, VERTEBRAL ARTERIES, PARASPINAL TISSUES: No MR findings of ligamentous injury. Vertebral artery flow voids present. Included posterior fossa and paraspinal soft tissues are normal. DISC LEVELS: C2-3: Small central disc protrusion. Mild facet arthropathy without canal stenosis or neural foraminal narrowing. C3-4: 2 mm broad-based disc bulge, uncovertebral hypertrophy and mild facet arthropathy. Mild canal stenosis. Moderate RIGHT, mild to moderate LEFT neural foraminal narrowing. C4-5: Annular bulging, uncovertebral hypertrophy and mild facet arthropathy. Mild canal stenosis. Moderate RIGHT and moderate to severe LEFT neural foraminal narrowing. C5-6: Broad-based disc bulge and LEFT central disc protrusion in total measure 3 mm in AP dimension. Moderate to severe canal stenosis, AP dimension the canal is 6 mm. Ventral cord deformity. Severe RIGHT and moderate to severe LEFT neural foraminal narrowing. C6-7: 2 mm broad-based disc bulge, uncovertebral hypertrophy and mild facet arthropathy. Moderate canal stenosis. Moderate to severe bilateral neural foraminal narrowing. C7-T1: No disc bulge, canal stenosis nor neural foraminal narrowing. MRI THORACIC SPINE FINDINGS ALIGNMENT: Maintenance of the thoracic kyphosis. No  malalignment. VERTEBRAE/DISCS: Vertebral bodies are intact. Old minimally displaced sternal fracture. Intervertebral discs morphology and signal are normal. Multilevel mild chronic discogenic endplate changes/spurring. Moderate subacute on chronic discogenic  endplate changes [id number]. Heterogeneous T1, bright STIR probable hemangioma T2, 10 mm. Additional large L1 hemangioma. No suspicious osseous or intradiscal enhancement. CORD: Patchy mildly expansile central spinal cord T2 hyperintense signal and varying enhancement T1-2 through T2, T5-6 through T7-8 with predominant dorsal cord involvement. Patchy T2 hyperintense signal within the conus medullaris. No syrinx. No abnormal leptomeningeal or epidural enhancement. No myelomalacia. PREVERTEBRAL AND PARASPINAL SOFT TISSUES:  Normal. DISC LEVELS: At T5-6 is 3 mm RIGHT central disc protrusion/extrusion without migration, contacting the ventral spinal cord without canal stenosis or neural foraminal narrowing at any level. MRI LUMBAR SPINE FINDINGS SEGMENTATION: For the purposes of this report, the last well-formed intervertebral disc will be described as L5-S1. ALIGNMENT: Maintenance of the lumbar lordosis. Grade 1 L4-5 anterolisthesis is similar, no spondylolysis. VERTEBRAE:Vertebral bodies are intact. Mild L4-5 and L5-S1 disc height loss is similar with decreased T2 signal within the lower lumbar disc compatible with mild desiccation. Mild chronic discogenic endplate changes 075-GRM and L5-S1. No abnormal bone marrow signal. No suspicious bone marrow signal. Large L1 hemangioma with additional scattered small hemangiomas. CONUS MEDULLARIS: Conus medullaris terminates at L1-2. Cauda equina is normal. PARASPINAL AND SOFT TISSUES: Extremely distended urinary bladder. DISC LEVELS: L1-2: Small broad-based LEFT central disc protrusion, interval re- absorption of extrusion. No canal stenosis or neural foraminal narrowing. L2-3: No disc bulge, canal stenosis nor neural foraminal  narrowing. L3-4: Annular bulging. Moderate facet arthropathy and ligamentum flavum redundancy with trace facet effusions which are likely reactive. No canal stenosis. Mild neural foraminal narrowing. L4-5: Anterolisthesis. Small broad-based disc bulge. Severe facet arthropathy and ligamentum flavum redundancy with trace facet effusions which are likely reactive. Mild canal stenosis including partial effacement lateral recesses which may affect the traversing L5 nerves. Moderate to severe RIGHT, moderate LEFT neural foraminal narrowing. L5-S1: Small broad-based disc bulge. Moderate to severe RIGHT, moderate LEFT facet arthropathy. Trace RIGHT facet effusion is likely reactive. No canal stenosis. Mild LEFT neural foraminal narrowing. IMPRESSION: MRI CERVICAL SPINE: Abnormal spinal cord signal and enhancement without discrete mass. 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 Recommend correlation with cerebral spinal fluid studies. Degenerative cervical spine results in moderate to severe canal stenosis C5-6, moderate at C6-7. Neural foraminal narrowing C3-4 thru C6-7: Severe on the RIGHT at C5-6, moderate to severe multiple levels. MRI THORACIC SPINE: Abnormal spinal cord signal and enhancement without discrete mass. 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 Recommend correlation with cerebral spinal fluid studies. Small T5-6 disc protrusion/extrusion without canal stenosis or neural foraminal narrowing. MRI LUMBAR SPINE: Re- absorption of L1-2 disc extrusion with small residual protrusion. Degenerative lumbar spine resulting in mild canal stenosis L4-5, similar grade 1 L4-5 anterolisthesis on degenerative basis. Neural foraminal narrowing L3-4 through L5-S1: Moderate to severe on the RIGHT at L4-5. Preliminary acute findings discussed with and reconfirmed by PA.JAIME WARD on 09/20/2016 at 5:35 pm. Electronically Signed   By: Elon Alas M.D.   On: 09/20/2016 19:19   Mr Thoracic Spine  W Wo Contrast  Result Date: 09/20/2016 CLINICAL DATA:  Acute on chronic low back pain radiating to buttocks and LEFT lower extremity with numbness and tingling. On Vicodin with minimal relief. EXAM: MRI CERVICAL, THORACIC SPINE WITH AND WITHOUT CONTRAST MR LUMBAR SPINE WITHOUT TECHNIQUE: Multiplanar and multiecho pulse sequences of the cervical spine, to include the craniocervical junction and cervicothoracic junction, and thoracic with and without contrast. Multiplanar and multi echo pulse sequences of the lumbar spine, were obtained without intravenous contrast. CONTRAST:  15 cc MultiHance COMPARISON:  CT chest May 19, 2014 and MRI of lumbar spine February 26, 2008 FINDINGS: MRI CERVICAL SPINE FINDINGS ALIGNMENT: Straightened cervical lordosis.  No malalignment. VERTEBRAE/DISCS: Vertebral bodies are intact. Moderate to severe C3-4 thru C6-7 disc height loss of with moderate chronic discogenic endplate changes and disc desiccation. No suspicious bone marrow signal. No suspicious osseous or intradiscal enhancement. CORD:Expansile T2 bright signal and patchy varying enhancement predominately involving the posterior columns C2 through C4-5 and more central within the spinal cord at C5 through C7-T1. No myelomalacia. No syrinx. No abnormal leptomeningeal or epidural enhancement. POSTERIOR FOSSA, VERTEBRAL ARTERIES, PARASPINAL TISSUES: No MR findings of ligamentous injury. Vertebral artery flow voids present. Included posterior fossa and paraspinal soft tissues are normal. DISC LEVELS: C2-3: Small central disc protrusion. Mild facet arthropathy without canal stenosis or neural foraminal narrowing. C3-4: 2 mm broad-based disc bulge, uncovertebral hypertrophy and mild facet arthropathy. Mild canal stenosis. Moderate RIGHT, mild to moderate LEFT neural foraminal narrowing. C4-5: Annular bulging, uncovertebral hypertrophy and mild facet arthropathy. Mild canal stenosis. Moderate RIGHT and moderate to severe LEFT neural  foraminal narrowing. C5-6: Broad-based disc bulge and LEFT central disc protrusion in total measure 3 mm in AP dimension. Moderate to severe canal stenosis, AP dimension the canal is 6 mm. Ventral cord deformity. Severe RIGHT and moderate to severe LEFT neural foraminal narrowing. C6-7: 2 mm broad-based disc bulge, uncovertebral hypertrophy and mild facet arthropathy. Moderate canal stenosis. Moderate to severe bilateral neural foraminal narrowing. C7-T1: No disc bulge, canal stenosis nor neural foraminal narrowing. MRI THORACIC SPINE FINDINGS ALIGNMENT: Maintenance of the thoracic kyphosis. No malalignment. VERTEBRAE/DISCS: Vertebral bodies are intact. Old minimally displaced sternal fracture. Intervertebral discs morphology and signal are normal. Multilevel mild chronic discogenic endplate changes/spurring. Moderate subacute on chronic discogenic endplate changes [id number]. Heterogeneous T1, bright STIR probable hemangioma T2, 10 mm. Additional large L1 hemangioma. No suspicious osseous or intradiscal enhancement. CORD: Patchy mildly expansile central spinal cord T2 hyperintense signal and varying enhancement T1-2 through T2, T5-6 through T7-8 with predominant dorsal cord involvement. Patchy T2 hyperintense signal within the conus medullaris. No syrinx. No abnormal leptomeningeal or epidural enhancement. No myelomalacia. PREVERTEBRAL AND PARASPINAL SOFT TISSUES:  Normal. DISC LEVELS: At T5-6 is 3 mm RIGHT central disc protrusion/extrusion without migration, contacting the ventral spinal cord without canal stenosis or neural foraminal narrowing at any level. MRI LUMBAR SPINE FINDINGS SEGMENTATION: For the purposes of this report, the last well-formed intervertebral disc will be described as L5-S1. ALIGNMENT: Maintenance of the lumbar lordosis. Grade 1 L4-5 anterolisthesis is similar, no spondylolysis. VERTEBRAE:Vertebral bodies are intact. Mild L4-5 and L5-S1 disc height loss is similar with decreased T2 signal within  the lower lumbar disc compatible with mild desiccation. Mild chronic discogenic endplate changes [id number] and L5-S1. No abnormal bone marrow signal. No suspicious bone marrow signal. Large L1 hemangioma with additional scattered small hemangiomas. CONUS MEDULLARIS: Conus medullaris terminates at L1-2. Cauda equina is normal. PARASPINAL AND SOFT TISSUES: Extremely distended urinary bladder. DISC LEVELS: L1-2: Small broad-based LEFT central disc protrusion, interval re- absorption of extrusion. No canal stenosis or neural foraminal narrowing. L2-3: No disc bulge, canal stenosis nor neural foraminal narrowing. L3-4: Annular bulging. Moderate facet arthropathy and ligamentum flavum redundancy with trace facet effusions which are likely reactive. No canal stenosis. Mild neural foraminal narrowing. L4-5: Anterolisthesis. Small broad-based disc bulge. Severe facet arthropathy and ligamentum flavum redundancy with trace facet effusions which are likely reactive. Mild canal stenosis including partial effacement lateral recesses which may affect the traversing L5 nerves. Moderate  to severe RIGHT, moderate LEFT neural foraminal narrowing. L5-S1: Small broad-based disc bulge. Moderate to severe RIGHT, moderate LEFT facet arthropathy. Trace RIGHT facet effusion is likely reactive. No canal stenosis. Mild LEFT neural foraminal narrowing. IMPRESSION: MRI CERVICAL SPINE: Abnormal spinal cord signal and enhancement without discrete mass. 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 Recommend correlation with cerebral spinal fluid studies. Degenerative cervical spine results in moderate to severe canal stenosis C5-6, moderate at C6-7. Neural foraminal narrowing C3-4 thru C6-7: Severe on the RIGHT at C5-6, moderate to severe multiple levels. MRI THORACIC SPINE: Abnormal spinal cord signal and enhancement without discrete mass. 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 Recommend correlation with cerebral spinal  fluid studies. Small T5-6 disc protrusion/extrusion without canal stenosis or neural foraminal narrowing. MRI LUMBAR SPINE: Re- absorption of L1-2 disc extrusion with small residual protrusion. Degenerative lumbar spine resulting in mild canal stenosis L4-5, similar grade 1 L4-5 anterolisthesis on degenerative basis. Neural foraminal narrowing L3-4 through L5-S1: Moderate to severe on the RIGHT at L4-5. Preliminary acute findings discussed with and reconfirmed by PA.JAIME WARD on 09/20/2016 at 5:35 pm. Electronically Signed   By: Elon Alas M.D.   On: 09/20/2016 19:19   Dg Fluoro Guide Lumbar Puncture  Result Date: 09/22/2016 CLINICAL DATA:  Myelitis. EXAM: DIAGNOSTIC LUMBAR PUNCTURE UNDER FLUOROSCOPIC GUIDANCE FLUOROSCOPY TIME:  Fluoroscopy Time:  12 seconds Radiation Exposure Index (if provided by the fluoroscopic device): 21.25 microGray*m^2 Number of Acquired Spot Images: 0 PROCEDURE: Informed consent was obtained from the patient prior to the procedure, including potential complications of headache, allergy, and pain. With the patient prone, the lower back was prepped with Betadine. 1% Lidocaine was used for local anesthesia. Lumbar puncture was performed at the L3-4 level using a 3.5 inch 20 gauge needle via a right interlaminar approach with return of clear CSF with an opening pressure of 9 cm water (measured in the left lateral decubitus position). 12 ml of CSF were obtained for laboratory studies. The patient tolerated the procedure well and there were no apparent complications. IMPRESSION: Successful fluoroscopic guided lumbar puncture. Electronically Signed   By: Logan Bores M.D.   On: 09/22/2016 18:21    Time Spent in minutes  [email or web address].D on 09/25/2016 at 1:36 PM  Between 7am to 7pm - Pager - [phone]  After 7pm go to [email or web address] - password [facility]  [facility] -  Office  [phone]

## 2016-09-25 NOTE — Progress Notes (Signed)
Subjective: She has no new complaints today. She continues to have tingling in both hands that makes it difficult for her to perform fine motor tasks. She also reports ongoing tingling in her legs. She feels weak in her left leg today. Foley catheter remains in place. She notes significant improvement in her back pain. She has been eating and sleeping well.   Exam: Vitals:   09/24/16 2141 09/25/16 0500  BP: 136/74 128/84  Pulse: 86 74  Resp: 17 16  Temp: 98.1 F (36.7 C) 98.1 F (36.7 C)    HEENT-  Normocephalic, no lesions, without obvious abnormality.  Normal external eye and conjunctiva.  Normal TM's bilaterally.  Normal auditory canals and external ears. Normal external nose, mucus membranes and septum.  Normal pharynx. Cardiovascular- S1, S2 normal, pulses palpable throughout   Lungs- chest clear, no wheezing, rales, normal symmetric air entry, Heart exam - S1, S2 normal, no murmur, no gallop, rate regular Abdomen- normal findings: bowel sounds normal Extremities- no edema Lymph-no adenopathy palpable Musculoskeletal-no joint tenderness, deformity or swelling Skin-warm and dry, no hyperpigmentation, vitiligo, or suspicious lesions   EXAM: General: WDWN woman in NAD. Alert, oriented x4. Speech is clear without dysarthria. Affect is bright. Comportment is normal.  HEENT: Neck is supple without lymphadenopathy. Mucous membranes are moist and the oropharynx is clear. Sclerae are anicteric. There is no conjunctival injection.  CV: Regular, no murmur. Carotid pulses are 2+ and symmetric with no bruits. Distal pulses 2+ and symmetric.   Extremities: No C/C/E. Neuro: MS: As noted above. No aphasia.  CN: Pupils are equal and reactive from 3-->2 mm bilaterally. EOMI, no nystagmus. Facial sensation is intact to light touch. Face is symmetric at rest with normal strength and mobility. Hearing is intact to conversational voice. Voice is normal in tone and quality. Palate elevates  symmetrically. Uvula is midline. Bilateral SCM and trapezii are 5/5. Tongue is midline with normal bulk and mobility.  Motor: Normal bulk, tone, and strength throughout with the exception of 4+/5 strength L PF/DF, KE, and HF. No pronator drift. No tremor or other abnormal movements are observed.  Sensation: Intact to light touch, pinprick and vibration. She reports dysesthesias in the arms.   DTRs: 3+ BUE and both knees. Toes are mute bilaterally. No pathological reflexes.  Coordination: Finger-to-nose and heel-to-shin are without dysmetria bilaterally.    Pertinent Labs/Diagnostics: CSF wbc 490 (86% lymphs, 14% monos) CSF rbc 34 CSF protein 53 CSF glucose 96 CSF HSV-1 PCR negative CSF HSV-2 PCR positive CSF VZV PCR pending CSF VDRL negative CSF oligoclonal bands pending CSF cx negative  NMO Abs negative RF <10 ANCA pending SS-A, SS-B Abs negative ANA pending ACE 24 B12 569 HIV nonreactive  Etta Quill PA-C [facility] [phone]  Impression:  A/P:   1. Myelitis: She has a cervical and thoracic myelitis. Workup thus far is notable for inflammatory CSF with a positive HSV-2 PCR. While rare, HSV-2 has been associated with both myelitis and encephalitis. The patient reports a history of genital herpes many years ago, no recent outbreaks or lesions reported. VZV PCR is still pending--this could also produce her symptoms. She is now on day #5/21 of iv acyclovir which will treat both. Continue to monitor daily BUN/creatinine while on acyclovir. She is on day #5/5 of IV Solumedrol.   2. Encephalitis: She had patchy areas of enhancement in the frontal lobes consistent with encephalitis. This can be seen with both HSV-2 and VZV as above. Continue IV acyclovir for total of  21 days as above.  3. BLE weakness: This is acute, due to myelitis. Symptoms have improved. Continue Solumedrol and acyclovir as above. Continue PT, appreciate assistance--will need evaluation for D/C  such as home versus inpatient [facility]  4. Dysesthesias: She has tingling in both hands and both legs, c/w myelitis. Continue solumedrol, acyclovir, PT/OT.     09/25/2016, 10:38 AM

## 2016-09-26 DIAGNOSIS — R200 Anesthesia of skin: Secondary | ICD-10-CM

## 2016-09-26 DIAGNOSIS — Z5181 Encounter for therapeutic drug level monitoring: Secondary | ICD-10-CM | POA: Diagnosis not present

## 2016-09-26 DIAGNOSIS — M549 Dorsalgia, unspecified: Secondary | ICD-10-CM | POA: Diagnosis not present

## 2016-09-26 DIAGNOSIS — G959 Disease of spinal cord, unspecified: Secondary | ICD-10-CM | POA: Diagnosis not present

## 2016-09-26 DIAGNOSIS — R220 Localized swelling, mass and lump, head: Secondary | ICD-10-CM | POA: Diagnosis not present

## 2016-09-26 DIAGNOSIS — B029 Zoster without complications: Secondary | ICD-10-CM | POA: Diagnosis not present

## 2016-09-26 DIAGNOSIS — R9089 Other abnormal findings on diagnostic imaging of central nervous system: Secondary | ICD-10-CM

## 2016-09-26 DIAGNOSIS — G0490 Encephalitis and encephalomyelitis, unspecified: Secondary | ICD-10-CM

## 2016-09-26 DIAGNOSIS — R202 Paresthesia of skin: Secondary | ICD-10-CM | POA: Diagnosis not present

## 2016-09-26 LAB — CSF CULTURE: CULTURE: NO GROWTH

## 2016-09-26 LAB — CSF CULTURE W GRAM STAIN

## 2016-09-26 MED ORDER — DEXTROSE 5 % IV SOLN: 10.0000 mg/kg | Freq: Three times a day (TID) | INTRAVENOUS | 0 refills | Status: DC

## 2016-09-26 MED ORDER — HEPARIN SOD (PORK) LOCK FLUSH 100 UNIT/ML IV SOLN
250.0000 [IU] | INTRAVENOUS | Status: AC | PRN
  Administered 2016-09-26: 250 [IU]

## 2016-09-26 NOTE — Progress Notes (Signed)
Physical Therapy Treatment Patient Details Name: Katelyn Lamb MRN: [id number] DOB: [birth year] Today's Date: 09/26/2016    History of Present Illness Pt adm with progressive lower extremity weakness and incontinence. Pt found to have subacute progressive myelopathy with enhancing lesions most consistent with myelitis. Further work up in progress. Pt also with shingles. PMH - anxiety, back pain.    PT Comments    Pt admitted with above diagnosis. Pt currently with functional limitations due to balance and endurance deficits. Pt was able to ambulate with RW in room due to DROPLET precautions.  Min guard assist as pt slightly unsteady at times.  Performed strengthening exercises.  Pt states she will have help at home.  Pt will benefit from skilled PT to increase their independence and safety with mobility to allow discharge to the venue listed below.    Follow Up Recommendations  Home health PT;Supervision for mobility/OOB     Equipment Recommendations  Rolling walker with 5" wheels    Recommendations for Other Services       Precautions / Restrictions Precautions Precautions: Fall Precaution Comments: DROPLET and MRSA precautions Restrictions Weight Bearing Restrictions: No    Mobility  Bed Mobility   Bed Mobility: Sit to Supine       Sit to supine: Supervision   General bed mobility comments: increased time and effort  Transfers Overall transfer level: Needs assistance Equipment used: Rolling walker (2 wheeled) Transfers: Sit to/from Stand Sit to Stand: Min guard         General transfer comment: min guard for safety; no unsteadiness noted upon standing although does continue to c/o numbness in feet and hands  Ambulation/Gait Ambulation/Gait assistance: Min guard;Min assist Ambulation Distance (Feet): 90 Feet Assistive device: Rolling walker (2 wheeled) Gait Pattern/deviations: Step-through pattern;Decreased stride length;Decreased dorsiflexion - left;Trunk  flexed Gait velocity: decreased Gait velocity interpretation: Below normal speed for age/gender General Gait Details: min guard for safety; slow, grossly steady gait with LOB when attempting to reach forward for object with assistance needed to steady; cues for bilat heel strike   Stairs            Wheelchair Mobility    Modified Rankin (Stroke Patients Only)       Balance Overall balance assessment: Needs assistance Sitting-balance support: No upper extremity supported;Feet supported Sitting balance-Leahy Scale: Good     Standing balance support: Bilateral upper extremity supported Standing balance-Leahy Scale: Poor Standing balance comment: walker and min guard for static standing             High level balance activites: Turns;Direction changes High Level Balance Comments: min guard assist with turns with cues to stay close to RW.    Cognition Arousal/Alertness: Awake/alert Behavior During Therapy: WFL for tasks assessed/performed;Anxious Overall Cognitive Status: Within Functional Limits for tasks assessed                      Exercises General Exercises - Lower Extremity Ankle Circles/Pumps: AROM;Both;15 reps Hip ABduction/ADduction: AROM;Both;5 reps;Standing Hip Flexion/Marching: AROM;Both;5 reps;Standing Other Exercises Other Exercises: knee flexion and hip extension in standing 10 reps.     General Comments        Pertinent Vitals/Pain Pain Assessment: No/denies pain  VSS    Home Living                      Prior Function            PT Goals (current goals can now be  found in the care plan section) Acute Rehab PT Goals Patient Stated Goal: be active again Progress towards PT goals: Progressing toward goals    Frequency    Min 3X/week      PT Plan Current plan remains appropriate    Co-evaluation             End of Session Equipment Utilized During Treatment: Gait belt Activity Tolerance: Patient  tolerated treatment well Patient left: with call bell/phone within reach;in bed;with bed alarm set     Time: [id number] PT Time Calculation (min) (ACUTE ONLY): 17 min  Charges:  $Gait Training: 8-22 mins                    G Codes:      Denice Paradise 2016/10/15, 2:27 PM [facility] Acute Rehabilitation [phone] (pager)

## 2016-09-26 NOTE — Progress Notes (Signed)
Discharge instructions gone over with patient. Home medications gone over. Follow up appointments to be made. [facility] will be following patient. Walker was delivered to patient's room. Patient is going home with picc line and the iv team has flushed and capped it off. Diet and activity was discussed. Patient verbalized understanding of instructions.

## 2016-09-26 NOTE — Care Management Note (Signed)
Case Management Note  Patient Details  Name: Katelyn Lamb MRN: [id number] Date of Birth: 1945-12-02  Subjective/Objective:                    Action/Plan:   Expected Discharge Date:                  Expected Discharge Plan:  [place]  In-House Referral:  NA  Discharge planning Services  CM Consult  Post Acute Care Choice:  Home Health Choice offered to:  Patient  DME Arranged:  Walker rolling DME Agency:  [place] Arranged:  PT, RN [facility] Agency:  [place]  Status of Service:  In process, will continue to follow  If discussed at Long Length of Stay Meetings, dates discussed:    Additional Comments:  Marilu Favre, RN 09/26/2016, 11:34 AM

## 2016-09-26 NOTE — Progress Notes (Addendum)
Subjective: Much improved  Exam: Vitals:   09/25/16 2145 09/26/16 0600  BP: 129/80 125/61  Pulse: 88 88  Resp: 19 18  Temp: 97.9 F (36.6 C) 98 F (36.7 C)     EXAM: General: WDWN womanin NAD. Alert, oriented x4. Speech is clear without dysarthria. Affect is bright. Comportment is normal.  HEENT:Neck is supple without lymphadenopathy. Mucous membranes are moist and the oropharynx is clear. Sclerae are anicteric. There is no conjunctival injection.  CV: Regular, no murmur. Carotid pulses are 2+ and symmetric with no bruits. Distal pulses 2+ and symmetric.  Extremities: No C/C/E. Neuro: MS:As noted above. No aphasia.  [id number] are equal and reactive from 3-->2 mm bilaterally. EOMI, no nystagmus. Facial sensation is intact to light touch. Face is symmetric at rest with normal strength and mobility. Hearing is intact to conversational voice. Voice is normal in tone and quality. Palate elevates symmetrically. Uvula is midline. Bilateral SCM and trapezii are 5/5. Tongue is midline with normal bulk and mobility.  Motor:Normal bulk, tone, and strength throughout with the exception of 4+/5 strength L PF/DF, KE, and HF. No pronator drift. No tremor or other abnormal movements are observed.  Sensation:Intact to light touch,pinprick andvibration. She reports dysesthesias in the arms.  DTRs: 3+ BUE and both knees. Toes are mutebilaterally. No pathological reflexes.  Coordination:Finger-to-nose and heel-to-shin are without dysmetria bilaterally    Pertinent Labs/Diagnostics: Pertinent Labs/Diagnostics: CSF wbc 490 (86% lymphs, 14% monos) CSF rbc 34 CSF protein 53 CSF glucose 96 CSF HSV-1 PCR negative CSF HSV-2 PCR positive CSF VZV PCR negative CSF VDRL negative CSF oligoclonal bands pending CSF cx negative  NMO Abs negative RF <10 ANCA pending SS-A, SS-B Abs negative ANA pending ACE 24 B12 569 HIV nonreactive    Impression:  A/P: 1. Myelitis: She has a  cervical and thoracic myelitis. Workup thus far is notable for inflammatory CSF with a positive HSV-2 PCR. While rare, HSV-2 has been associated with both myelitis and encephalitis. The patient reports a history of genital herpes many years ago, no recent outbreaks or lesions reported. VZV PCR is negative--this could also produce her symptoms. She is now on day #5/21 of iv acyclovir which will treat both. Continue to monitor daily BUN/creatinine while on acyclovir. She is on day #5/5 of IV Solumedrol.   2. Encephalitis: She had patchy areas of enhancement in the frontal lobes consistent with encephalitis. This can be seen with both HSV-2 and VZV as above. Continue IV acyclovir for total of 21 days as above.  3. BLE weakness: This is acute, due to myelitis. Symptoms have improved. Continue Solumedrol and acyclovir as above. Continue PT, appreciate assistance--will need evaluation for D/C such as home versus inpatient [facility]  4. Dysesthesias: She has tingling in both hands and both legs, c/w myelitis. Continue solumedrol, acyclovir, PT/OT.    NEUROLOGY S/O  Etta Quill PA-C [facility] [phone]  M-F  (8:30 am- 4 PM)  09/26/2016, 11:20 AM  Addendum:  I was called back to the room for further information about her diagnosis. One hour was spent with both husband and wife talking about her diagnosis. 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 It was also explained to them that she is not contagious at this time and that is okay to be around family members. It was also explained that she will be going home with a central catheter and they will be using heparin to flush support.  As far as her symptoms of tingling in her fingers and legs it was explained that only time will tell if this improves and if it does not improve over a period of 6 months she can go for an nerve conduction study and they can evaluate if it is permanent.  She is currently on Neurontin and this was explained to them that this is the drug of choice for neuropathy. There is explained to them that they most likely will have a follow-up with neurology in approximately 2 weeks. If anything changes they're to call the outpatient neurologist and see if they can have a appointment fitted in during that time if somebody does not go to their scheduled appointment. All studies, labs, and diagnostic tests were explained. All terminology such as meningitis and the myelitis was explained to them. What to expect during rehabilitation and the need to continue with rehabilitation exercises at home when she is not at rehabilitation was explained to them. It was explained to them that after they are discharged they are not to call the hospital to reach neuro hospitalist as we do not work outside the hospital. After this full conversation I feel the understood some but unfortunately I feel they were still confused about many many issues. I asked multiple times if there is any way I can help them and they said no.

## 2016-09-26 NOTE — Discharge Summary (Signed)
Physician Discharge Summary  Katelyn Lamb [id number] DOB: 04/20/1946 DOA: 09/20/2016  PCP: Katelyn Amel, MD  Admit date: 09/20/2016 Discharge date: 09/26/2016  Time spent: 60 minutes  Recommendations for Outpatient Follow-up:  1. Follow-up with Dr. Floyde Parkins, neurology in 1-2 weeks. 2. Follow-up with Katelyn Amel, MD in 1-2 weeks.   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  Diet recommendation: Regular.  Filed Weights   09/20/16 1157 09/20/16 2108  Weight: 65.8 kg (145 lb) 65 kg (143 lb 3.2 oz)    History of present illness:  Per Dr Katelyn Lamb is a 71 y.o. female with medical history significant of anxiety and chronic back pain who was coming to the emergency department with progressively worse lower back pain and numbness that radiated to her lower extremities since 09/04/2016 while she was in [place]. After returning home, symptoms continued to progress to the point that she was unable to ambulate without assistance and has had sphincteric incontinence over the past few days. She denied fever,  but feels fatigued. She denied headache, sore throat, productive cough, chest pain, dizziness, palpitations, pitting edema lower extremities, abdominal pain, nausea, emesis, diarrhea or constipation. She denied dysuria or frequency.  ED Course: The patient received IV morphine and Benadryl reporting relief. WBC is 10.9, hemoglobin 15.8 g/dL and platelets 270. Sodium 139, potassium 3.9, chloride 96 and CO2 23 mmol/L. Her BUN was 24 mg/dL. MRI of the spine showed multiple abnormalities at all levels. Please see reports for further detail. Lamb Course:  Rule out transverse  myelitis/encephalitis Patient Had presented with progressive worsening lower back pain and numbness that radiated to the lower extremities which started 09-2016 with some associated sphincteric incontinence. Patient was admitted neurology consult obtained. MRI of the brain, C-spine, T-spine and L-spine were obtained.  LP done on 1/20 was positive for HSV-2 DNA Patient was placed on IV steroids and IV acyclovir pending neurology recommendations and recommended to continue acyclovir for total of 3 weeks. Patient received a total of 5 days of high-dose IV Solu-Medrol. Patient was seen by physical therapy. Patient will discharge home with home health services and IV acyclovir to complete a three-week course of treatment.   Hyponatremia, mild Improved with hydration.   Rash likely to to zoster Patient was maintained on IV acyclovir and contact isolation during the hospitalization.  Hypokalemia Repleted during hospitalization.  Leukocytosis Secondary to steroids   Procedures:  MRI brain 09/21/2016, 09/23/2016  MRI of the C-spine, T-spine, L-spine 09/20/2016  Lumbar puncture under fluoroscopy Per Dr. Jeralyn Ruths 09/22/2016  Consultations:  Orthopedics: Dr. Rolena Infante 09/20/2016  Neurology Dr. Leonel Ramsay 09/20/2016    Discharge Exam: Vitals:   09/25/16 2145 09/26/16 0600  BP: 129/80 125/61  Pulse: 88 88  Resp: 19 18  Temp: 97.9 F (36.6 C) 98 F (36.7 C)    General: NAD Cardiovascular: RRR Respiratory: CTAB  Discharge Instructions   Discharge Instructions    Ambulatory referral to Neurology    Complete by:  As directed    An appointment is requested in approximately: 1-2 WEEKS.   Diet general    Complete by:  As directed    Increase activity slowly    Complete by:  As directed  Current Discharge Medication List    START taking these medications   Details  acyclovir 650 mg in dextrose 5 % 100 mL Inject 650 mg into the vein every 8 (eight) hours. TAKE FOR 16  MORE DAYS. Qty: 31200 mg, Refills: 0      CONTINUE these medications which have NOT CHANGED   Details  ALPRAZolam (XANAX) 1 MG tablet Take 1 mg by mouth at bedtime as needed for anxiety or sleep.     calcium carbonate (OS-CAL) 600 MG TABS tablet Take 600 mg by mouth 2 (two) times daily with a meal.    cholecalciferol (VITAMIN D) 1000 units tablet Take 1,000 Units by mouth daily.    diazepam (VALIUM) 5 MG tablet Take 1 tablet (5 mg total) by mouth 2 (two) times daily. Qty: 10 tablet, Refills: 0    diphenhydramine-acetaminophen (TYLENOL PM) 25-500 MG TABS tablet Take 1 tablet by mouth at bedtime as needed (sleep/pain).    gabapentin (NEURONTIN) 100 MG capsule Take 100 mg by mouth 3 (three) times daily.    HYDROcodone-acetaminophen (NORCO) 10-325 MG per tablet Take 1 tablet by mouth every 8 (eight) hours as needed for moderate pain. Qty: 30 tablet, Refills: 0   Associated Diagnoses: Right medial tibial plateau fracture, closed, initial encounter; Pain in right knee    ketorolac (TORADOL) 10 MG tablet Take 1 tablet (10 mg total) by mouth every 6 (six) hours as needed. Qty: 20 tablet, Refills: 0    morphine (MSIR) 30 MG tablet Take 1 tablet (30 mg total) by mouth every 6 (six) hours as needed for severe pain. Qty: 20 tablet, Refills: 0    omega-3 acid ethyl esters (LOVAZA) 1 G capsule Take by mouth 2 (two) times daily.      STOP taking these medications     HYDROmorphone (DILAUDID) 2 MG tablet      predniSONE (DELTASONE) 10 MG tablet        Allergies  Allergen Reactions  . Amoxicillin-Pot Clavulanate Other (See Comments)  . Demerol [Meperidine]     Hallucinations  . Penicillins    Follow-up Information    KOIRALA,DIBAS, MD Follow up in 1 week(s).   Specialty:  Family Medicine Why:  F/U IN 1-2 WEEKS. Contact information: [place] [address] [phone]        Lenor Coffin, MD. Schedule an appointment as soon as possible  for a visit in 1 week(s).   Specialty:  Neurology Why:  F/U IN 1-2 WEEKS. OFFICE WILL CALL WITH APPOINTMENT TIME. Contact information: [address] [place]  [address] [phone]            The results of significant diagnostics from this hospitalization (including imaging, microbiology, ancillary and laboratory) are listed below for reference.    Significant Diagnostic Studies: Dg Lumbar Spine Complete  Result Date: 09/09/2016 CLINICAL DATA:  Low back pain with left leg pain EXAM: LUMBAR SPINE - COMPLETE 4+ VIEW COMPARISON:  Lumbar MRI 02/26/2008 FINDINGS: 5 mm anterior slip L4-L5 has progressed in the interval. Remaining alignment normal. Negative for fracture or pars defect. Facet degeneration at L4-5. Disc spaces intact. IMPRESSION: Grade 1 anterolisthesis L4-5 has progressed since 2009. No acute abnormality Electronically Signed   By: Franchot Gallo M.D.   On: 09/09/2016 18:59   Mr Brain Wo Contrast  Result Date: 09/21/2016 CLINICAL DATA:  71 y/o F; worsening back pain and lower extremity weakness/numbness. EXAM: MRI HEAD WITHOUT CONTRAST TECHNIQUE: Multiplanar, multiecho pulse sequences of  the brain and surrounding structures were obtained without intravenous contrast. COMPARISON:  09/20/2016 cervical MRI.  08/01/2016 MRI of the brain. FINDINGS: Brain: Increased T2 FLAIR signal within the right paramedian frontal sulci (series 12, image 157). Within the bilateral paramedian cingulate and right frontal lobes in the region of incomplete FLAIR suppression there is cortical diffusion restriction and increased T2 FLAIR hyperintense signal (series 7 image 17 and series 5, image 26). Humerus T2 FLAIR hyperintense foci in white matter in both periventricular and subcortical white matter are similar in distribution in comparison with the prior MRI. On the sagittal T2 FLAIR volumetric sequence there are foci of signal abnormality at the cervicomedullary junction and upper cervical  cord better characterized on the prior cervical MRI. No abnormal susceptibility hypointensity. No focal mass effect. No hydrocephalus. Vascular: Normal flow voids. Skull and upper cervical spine: Normal marrow signal. Sinuses/Orbits: Right mastoid effusion. No abnormal signal of paranasal sinuses or left mastoid air cell. Bilateral intra-ocular lens replacement. Other: None. IMPRESSION: 1. Cortical diffusion and FLAIR signal abnormality with some increased sulcal FLAIR signal centered in the anterior paramedian frontal and cingulate gyri. 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 The pattern of meningeal and cortical inflammation is not typical of multiple sclerosis or neuromyelitis optica favoring an infectious or other autoimmune etiology. Consider follow-up with MRI of the brain with contrast. 2. Stable nonspecific T2 FLAIR hyperintense white matter foci may represent microvascular ischemic changes particularly in the setting of diabetes or hypertension or sequelae of demyelination, vasculitis, and other infectious/inflammatory processes. Electronically Signed   By: Kristine Garbe M.D.   On: 09/21/2016 02:57   Mr Jeri Cos [id number] Contrast  Result Date: 09/23/2016 CLINICAL DATA:  71 y/o  F; encephalitis. EXAM: MRI HEAD WITHOUT AND WITH CONTRAST TECHNIQUE: Multiplanar, multiecho pulse sequences of the brain and surrounding structures were obtained without and with intravenous contrast. CONTRAST:  37mL MULTIHANCE GADOBENATE DIMEGLUMINE 529 MG/ML IV SOLN COMPARISON:  09/21/2016 MRI of the brain. FINDINGS: Brain: Cortical T2 FLAIR hyperintense signal abnormality with diffusion restriction in the paramedian frontal and cingulate gyri is stable in distribution in comparison with the prior MRI of the brain. Sulcal increased FLAIR signal is largely resolved with minimal residual in the right paramedian frontal lobe (series 6, image 18). After  administration of intravenous contrast there is faint cortical enhancement which corresponds best to diffusion restricting cortex (series 8, image 28 and series 13, image 23). There is no appreciable leptomeningeal enhancement. There are stable foci of T2 FLAIR hyperintense signal abnormality in subcortical and periventricular white matter from prior MRIs. There is no new focus of diffusion restriction, focal mass effect, FLAIR signal abnormality, or susceptibility hypointensity. There is no additional abnormal enhancement of the brain. Vascular: Normal flow voids. Skull and upper cervical spine: Normal marrow signal. Sinuses/Orbits: Stable right mastoid effusion. No abnormal signal of paranasal sinuses. Orbits are unremarkable. Other: None. IMPRESSION: 1. The region of paramedian frontal and cingulate gyrus cortical T2 FLAIR and diffusion signal abnormality is stable and demonstrates mild enhancement. Sulcal FLAIR signal abnormality has largely resolved and there is no leptomeningeal enhancement. Findings are consistent with encephalitis, probably slightly improved given diminution in sulcal FLAIR signal abnormality. No new areas of encephalitis are identified. 2. No new signal abnormality of the brain parenchyma. 3. Stable background of nonspecific T2 FLAIR hyperintense white matter foci may represent microvascular ischemic changes particularly in the setting of diabetes or hypertension or sequelae of demyelination, vasculitis, and  other infectious/inflammatory processes. Electronically Signed   By: Kristine Garbe M.D.   On: 09/23/2016 06:07   Mr Lumbar Spine Wo Contrast  Result Date: 09/20/2016 CLINICAL DATA:  Acute on chronic low back pain radiating to buttocks and LEFT lower extremity with numbness and tingling. On Vicodin with minimal relief. EXAM: MRI CERVICAL, THORACIC SPINE WITH AND WITHOUT CONTRAST MR LUMBAR SPINE WITHOUT TECHNIQUE: Multiplanar and multiecho pulse sequences of the cervical  spine, to include the craniocervical junction and cervicothoracic junction, and thoracic with and without contrast. Multiplanar and multi echo pulse sequences of the lumbar spine, were obtained without intravenous contrast. CONTRAST:  15 cc MultiHance COMPARISON:  CT chest May 19, 2014 and MRI of lumbar spine February 26, 2008 FINDINGS: MRI CERVICAL SPINE FINDINGS ALIGNMENT: Straightened cervical lordosis.  No malalignment. VERTEBRAE/DISCS: Vertebral bodies are intact. Moderate to severe C3-4 thru C6-7 disc height loss of with moderate chronic discogenic endplate changes and disc desiccation. No suspicious bone marrow signal. No suspicious osseous or intradiscal enhancement. CORD:Expansile T2 bright signal and patchy varying enhancement predominately involving the posterior columns C2 through C4-5 and more central within the spinal cord at C5 through C7-T1. No myelomalacia. No syrinx. No abnormal leptomeningeal or epidural enhancement. POSTERIOR FOSSA, VERTEBRAL ARTERIES, PARASPINAL TISSUES: No MR findings of ligamentous injury. Vertebral artery flow voids present. Included posterior fossa and paraspinal soft tissues are normal. DISC LEVELS: C2-3: Small central disc protrusion. Mild facet arthropathy without canal stenosis or neural foraminal narrowing. C3-4: 2 mm broad-based disc bulge, uncovertebral hypertrophy and mild facet arthropathy. Mild canal stenosis. Moderate RIGHT, mild to moderate LEFT neural foraminal narrowing. C4-5: Annular bulging, uncovertebral hypertrophy and mild facet arthropathy. Mild canal stenosis. Moderate RIGHT and moderate to severe LEFT neural foraminal narrowing. C5-6: Broad-based disc bulge and LEFT central disc protrusion in total measure 3 mm in AP dimension. Moderate to severe canal stenosis, AP dimension the canal is 6 mm. Ventral cord deformity. Severe RIGHT and moderate to severe LEFT neural foraminal narrowing. C6-7: 2 mm broad-based disc bulge, uncovertebral hypertrophy and  mild facet arthropathy. Moderate canal stenosis. Moderate to severe bilateral neural foraminal narrowing. C7-T1: No disc bulge, canal stenosis nor neural foraminal narrowing. MRI THORACIC SPINE FINDINGS ALIGNMENT: Maintenance of the thoracic kyphosis. No malalignment. VERTEBRAE/DISCS: Vertebral bodies are intact. Old minimally displaced sternal fracture. Intervertebral discs morphology and signal are normal. Multilevel mild chronic discogenic endplate changes/spurring. Moderate subacute on chronic discogenic endplate changes [id number]. Heterogeneous T1, bright STIR probable hemangioma T2, 10 mm. Additional large L1 hemangioma. No suspicious osseous or intradiscal enhancement. CORD: Patchy mildly expansile central spinal cord T2 hyperintense signal and varying enhancement T1-2 through T2, T5-6 through T7-8 with predominant dorsal cord involvement. Patchy T2 hyperintense signal within the conus medullaris. No syrinx. No abnormal leptomeningeal or epidural enhancement. No myelomalacia. PREVERTEBRAL AND PARASPINAL SOFT TISSUES:  Normal. DISC LEVELS: At T5-6 is 3 mm RIGHT central disc protrusion/extrusion without migration, contacting the ventral spinal cord without canal stenosis or neural foraminal narrowing at any level. MRI LUMBAR SPINE FINDINGS SEGMENTATION: For the purposes of this report, the last well-formed intervertebral disc will be described as L5-S1. ALIGNMENT: Maintenance of the lumbar lordosis. Grade 1 L4-5 anterolisthesis is similar, no spondylolysis. VERTEBRAE:Vertebral bodies are intact. Mild L4-5 and L5-S1 disc height loss is similar with decreased T2 signal within the lower lumbar disc compatible with mild desiccation. Mild chronic discogenic endplate changes [id number]-GRM and L5-S1. No abnormal bone marrow signal. No suspicious bone marrow signal. Large L1 hemangioma with additional scattered small hemangiomas.  CONUS MEDULLARIS: Conus medullaris terminates at L1-2. Cauda equina is normal. PARASPINAL AND SOFT  TISSUES: Extremely distended urinary bladder. DISC LEVELS: L1-2: Small broad-based LEFT central disc protrusion, interval re- absorption of extrusion. No canal stenosis or neural foraminal narrowing. L2-3: No disc bulge, canal stenosis nor neural foraminal narrowing. L3-4: Annular bulging. Moderate facet arthropathy and ligamentum flavum redundancy with trace facet effusions which are likely reactive. No canal stenosis. Mild neural foraminal narrowing. L4-5: Anterolisthesis. Small broad-based disc bulge. Severe facet arthropathy and ligamentum flavum redundancy with trace facet effusions which are likely reactive. Mild canal stenosis including partial effacement lateral recesses which may affect the traversing L5 nerves. Moderate to severe RIGHT, moderate LEFT neural foraminal narrowing. L5-S1: Small broad-based disc bulge. Moderate to severe RIGHT, moderate LEFT facet arthropathy. Trace RIGHT facet effusion is likely reactive. No canal stenosis. Mild LEFT neural foraminal narrowing. IMPRESSION: MRI CERVICAL SPINE: Abnormal spinal cord signal and enhancement without discrete mass. 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 Recommend correlation with cerebral spinal fluid studies. Degenerative cervical spine results in moderate to severe canal stenosis C5-6, moderate at C6-7. Neural foraminal narrowing C3-4 thru C6-7: Severe on the RIGHT at C5-6, moderate to severe multiple levels. MRI THORACIC SPINE: Abnormal spinal cord signal and enhancement without discrete mass. 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 Recommend correlation with cerebral spinal fluid studies. Small T5-6 disc protrusion/extrusion without canal stenosis or neural foraminal narrowing. MRI LUMBAR SPINE: Re- absorption of L1-2 disc extrusion with small residual protrusion. Degenerative lumbar spine resulting in mild canal stenosis L4-5, similar grade 1 L4-5 anterolisthesis on degenerative basis. Neural foraminal narrowing  L3-4 through L5-S1: Moderate to severe on the RIGHT at L4-5. Preliminary acute findings discussed with and reconfirmed by PA.JAIME WARD on 09/20/2016 at 5:35 pm. Electronically Signed   By: Elon Alas M.D.   On: 09/20/2016 19:19   Mr Cervical Spine W Wo Contrast  Result Date: 09/20/2016 CLINICAL DATA:  Acute on chronic low back pain radiating to buttocks and LEFT lower extremity with numbness and tingling. On Vicodin with minimal relief. EXAM: MRI CERVICAL, THORACIC SPINE WITH AND WITHOUT CONTRAST MR LUMBAR SPINE WITHOUT TECHNIQUE: Multiplanar and multiecho pulse sequences of the cervical spine, to include the craniocervical junction and cervicothoracic junction, and thoracic with and without contrast. Multiplanar and multi echo pulse sequences of the lumbar spine, were obtained without intravenous contrast. CONTRAST:  15 cc MultiHance COMPARISON:  CT chest May 19, 2014 and MRI of lumbar spine February 26, 2008 FINDINGS: MRI CERVICAL SPINE FINDINGS ALIGNMENT: Straightened cervical lordosis.  No malalignment. VERTEBRAE/DISCS: Vertebral bodies are intact. Moderate to severe C3-4 thru C6-7 disc height loss of with moderate chronic discogenic endplate changes and disc desiccation. No suspicious bone marrow signal. No suspicious osseous or intradiscal enhancement. CORD:Expansile T2 bright signal and patchy varying enhancement predominately involving the posterior columns C2 through C4-5 and more central within the spinal cord at C5 through C7-T1. No myelomalacia. No syrinx. No abnormal leptomeningeal or epidural enhancement. POSTERIOR FOSSA, VERTEBRAL ARTERIES, PARASPINAL TISSUES: No MR findings of ligamentous injury. Vertebral artery flow voids present. Included posterior fossa and paraspinal soft tissues are normal. DISC LEVELS: C2-3: Small central disc protrusion. Mild facet arthropathy without canal stenosis or neural foraminal narrowing. C3-4: 2 mm broad-based disc bulge, uncovertebral hypertrophy and  mild facet arthropathy. Mild canal stenosis. Moderate RIGHT, mild to moderate LEFT neural foraminal narrowing. C4-5: Annular bulging, uncovertebral hypertrophy and mild facet arthropathy. Mild canal stenosis. Moderate RIGHT and moderate to severe LEFT neural foraminal  narrowing. C5-6: Broad-based disc bulge and LEFT central disc protrusion in total measure 3 mm in AP dimension. Moderate to severe canal stenosis, AP dimension the canal is 6 mm. Ventral cord deformity. Severe RIGHT and moderate to severe LEFT neural foraminal narrowing. C6-7: 2 mm broad-based disc bulge, uncovertebral hypertrophy and mild facet arthropathy. Moderate canal stenosis. Moderate to severe bilateral neural foraminal narrowing. C7-T1: No disc bulge, canal stenosis nor neural foraminal narrowing. MRI THORACIC SPINE FINDINGS ALIGNMENT: Maintenance of the thoracic kyphosis. No malalignment. VERTEBRAE/DISCS: Vertebral bodies are intact. Old minimally displaced sternal fracture. Intervertebral discs morphology and signal are normal. Multilevel mild chronic discogenic endplate changes/spurring. Moderate subacute on chronic discogenic endplate changes [id number]. Heterogeneous T1, bright STIR probable hemangioma T2, 10 mm. Additional large L1 hemangioma. No suspicious osseous or intradiscal enhancement. CORD: Patchy mildly expansile central spinal cord T2 hyperintense signal and varying enhancement T1-2 through T2, T5-6 through T7-8 with predominant dorsal cord involvement. Patchy T2 hyperintense signal within the conus medullaris. No syrinx. No abnormal leptomeningeal or epidural enhancement. No myelomalacia. PREVERTEBRAL AND PARASPINAL SOFT TISSUES:  Normal. DISC LEVELS: At T5-6 is 3 mm RIGHT central disc protrusion/extrusion without migration, contacting the ventral spinal cord without canal stenosis or neural foraminal narrowing at any level. MRI LUMBAR SPINE FINDINGS SEGMENTATION: For the purposes of this report, the last well-formed  intervertebral disc will be described as L5-S1. ALIGNMENT: Maintenance of the lumbar lordosis. Grade 1 L4-5 anterolisthesis is similar, no spondylolysis. VERTEBRAE:Vertebral bodies are intact. Mild L4-5 and L5-S1 disc height loss is similar with decreased T2 signal within the lower lumbar disc compatible with mild desiccation. Mild chronic discogenic endplate changes [id number]-GRM and L5-S1. No abnormal bone marrow signal. No suspicious bone marrow signal. Large L1 hemangioma with additional scattered small hemangiomas. CONUS MEDULLARIS: Conus medullaris terminates at L1-2. Cauda equina is normal. PARASPINAL AND SOFT TISSUES: Extremely distended urinary bladder. DISC LEVELS: L1-2: Small broad-based LEFT central disc protrusion, interval re- absorption of extrusion. No canal stenosis or neural foraminal narrowing. L2-3: No disc bulge, canal stenosis nor neural foraminal narrowing. L3-4: Annular bulging. Moderate facet arthropathy and ligamentum flavum redundancy with trace facet effusions which are likely reactive. No canal stenosis. Mild neural foraminal narrowing. L4-5: Anterolisthesis. Small broad-based disc bulge. Severe facet arthropathy and ligamentum flavum redundancy with trace facet effusions which are likely reactive. Mild canal stenosis including partial effacement lateral recesses which may affect the traversing L5 nerves. Moderate to severe RIGHT, moderate LEFT neural foraminal narrowing. L5-S1: Small broad-based disc bulge. Moderate to severe RIGHT, moderate LEFT facet arthropathy. Trace RIGHT facet effusion is likely reactive. No canal stenosis. Mild LEFT neural foraminal narrowing. IMPRESSION: MRI CERVICAL SPINE: Abnormal spinal cord signal and enhancement without discrete mass. 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 Recommend correlation with cerebral spinal fluid studies. Degenerative cervical spine results in moderate to severe canal stenosis C5-6, moderate at C6-7. Neural foraminal  narrowing C3-4 thru C6-7: Severe on the RIGHT at C5-6, moderate to severe multiple levels. MRI THORACIC SPINE: Abnormal spinal cord signal and enhancement without discrete mass. 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 Recommend correlation with cerebral spinal fluid studies. Small T5-6 disc protrusion/extrusion without canal stenosis or neural foraminal narrowing. MRI LUMBAR SPINE: Re- absorption of L1-2 disc extrusion with small residual protrusion. Degenerative lumbar spine resulting in mild canal stenosis L4-5, similar grade 1 L4-5 anterolisthesis on degenerative basis. Neural foraminal narrowing L3-4 through L5-S1: Moderate to severe on the RIGHT at L4-5. Preliminary acute findings discussed with and reconfirmed by PA.JAIME WARD  on 09/20/2016 at 5:35 pm. Electronically Signed   By: Elon Alas M.D.   On: 09/20/2016 19:19   Mr Thoracic Spine W Wo Contrast  Result Date: 09/20/2016 CLINICAL DATA:  Acute on chronic low back pain radiating to buttocks and LEFT lower extremity with numbness and tingling. On Vicodin with minimal relief. EXAM: MRI CERVICAL, THORACIC SPINE WITH AND WITHOUT CONTRAST MR LUMBAR SPINE WITHOUT TECHNIQUE: Multiplanar and multiecho pulse sequences of the cervical spine, to include the craniocervical junction and cervicothoracic junction, and thoracic with and without contrast. Multiplanar and multi echo pulse sequences of the lumbar spine, were obtained without intravenous contrast. CONTRAST:  15 cc MultiHance COMPARISON:  CT chest May 19, 2014 and MRI of lumbar spine February 26, 2008 FINDINGS: MRI CERVICAL SPINE FINDINGS ALIGNMENT: Straightened cervical lordosis.  No malalignment. VERTEBRAE/DISCS: Vertebral bodies are intact. Moderate to severe C3-4 thru C6-7 disc height loss of with moderate chronic discogenic endplate changes and disc desiccation. No suspicious bone marrow signal. No suspicious osseous or intradiscal enhancement. CORD:Expansile T2 bright signal  and patchy varying enhancement predominately involving the posterior columns C2 through C4-5 and more central within the spinal cord at C5 through C7-T1. No myelomalacia. No syrinx. No abnormal leptomeningeal or epidural enhancement. POSTERIOR FOSSA, VERTEBRAL ARTERIES, PARASPINAL TISSUES: No MR findings of ligamentous injury. Vertebral artery flow voids present. Included posterior fossa and paraspinal soft tissues are normal. DISC LEVELS: C2-3: Small central disc protrusion. Mild facet arthropathy without canal stenosis or neural foraminal narrowing. C3-4: 2 mm broad-based disc bulge, uncovertebral hypertrophy and mild facet arthropathy. Mild canal stenosis. Moderate RIGHT, mild to moderate LEFT neural foraminal narrowing. C4-5: Annular bulging, uncovertebral hypertrophy and mild facet arthropathy. Mild canal stenosis. Moderate RIGHT and moderate to severe LEFT neural foraminal narrowing. C5-6: Broad-based disc bulge and LEFT central disc protrusion in total measure 3 mm in AP dimension. Moderate to severe canal stenosis, AP dimension the canal is 6 mm. Ventral cord deformity. Severe RIGHT and moderate to severe LEFT neural foraminal narrowing. C6-7: 2 mm broad-based disc bulge, uncovertebral hypertrophy and mild facet arthropathy. Moderate canal stenosis. Moderate to severe bilateral neural foraminal narrowing. C7-T1: No disc bulge, canal stenosis nor neural foraminal narrowing. MRI THORACIC SPINE FINDINGS ALIGNMENT: Maintenance of the thoracic kyphosis. No malalignment. VERTEBRAE/DISCS: Vertebral bodies are intact. Old minimally displaced sternal fracture. Intervertebral discs morphology and signal are normal. Multilevel mild chronic discogenic endplate changes/spurring. Moderate subacute on chronic discogenic endplate changes [id number]. Heterogeneous T1, bright STIR probable hemangioma T2, 10 mm. Additional large L1 hemangioma. No suspicious osseous or intradiscal enhancement. CORD: Patchy mildly expansile central  spinal cord T2 hyperintense signal and varying enhancement T1-2 through T2, T5-6 through T7-8 with predominant dorsal cord involvement. Patchy T2 hyperintense signal within the conus medullaris. No syrinx. No abnormal leptomeningeal or epidural enhancement. No myelomalacia. PREVERTEBRAL AND PARASPINAL SOFT TISSUES:  Normal. DISC LEVELS: At T5-6 is 3 mm RIGHT central disc protrusion/extrusion without migration, contacting the ventral spinal cord without canal stenosis or neural foraminal narrowing at any level. MRI LUMBAR SPINE FINDINGS SEGMENTATION: For the purposes of this report, the last well-formed intervertebral disc will be described as L5-S1. ALIGNMENT: Maintenance of the lumbar lordosis. Grade 1 L4-5 anterolisthesis is similar, no spondylolysis. VERTEBRAE:Vertebral bodies are intact. Mild L4-5 and L5-S1 disc height loss is similar with decreased T2 signal within the lower lumbar disc compatible with mild desiccation. Mild chronic discogenic endplate changes [id number] and L5-S1. No abnormal bone marrow signal. No suspicious bone marrow signal. Large L1 hemangioma with additional  scattered small hemangiomas. CONUS MEDULLARIS: Conus medullaris terminates at L1-2. Cauda equina is normal. PARASPINAL AND SOFT TISSUES: Extremely distended urinary bladder. DISC LEVELS: L1-2: Small broad-based LEFT central disc protrusion, interval re- absorption of extrusion. No canal stenosis or neural foraminal narrowing. L2-3: No disc bulge, canal stenosis nor neural foraminal narrowing. L3-4: Annular bulging. Moderate facet arthropathy and ligamentum flavum redundancy with trace facet effusions which are likely reactive. No canal stenosis. Mild neural foraminal narrowing. L4-5: Anterolisthesis. Small broad-based disc bulge. Severe facet arthropathy and ligamentum flavum redundancy with trace facet effusions which are likely reactive. Mild canal stenosis including partial effacement lateral recesses which may affect the traversing L5  nerves. Moderate to severe RIGHT, moderate LEFT neural foraminal narrowing. L5-S1: Small broad-based disc bulge. Moderate to severe RIGHT, moderate LEFT facet arthropathy. Trace RIGHT facet effusion is likely reactive. No canal stenosis. Mild LEFT neural foraminal narrowing. IMPRESSION: MRI CERVICAL SPINE: Abnormal spinal cord signal and enhancement without discrete mass. 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 Recommend correlation with cerebral spinal fluid studies. Degenerative cervical spine results in moderate to severe canal stenosis C5-6, moderate at C6-7. Neural foraminal narrowing C3-4 thru C6-7: Severe on the RIGHT at C5-6, moderate to severe multiple levels. MRI THORACIC SPINE: Abnormal spinal cord signal and enhancement without discrete mass. 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 Recommend correlation with cerebral spinal fluid studies. Small T5-6 disc protrusion/extrusion without canal stenosis or neural foraminal narrowing. MRI LUMBAR SPINE: Re- absorption of L1-2 disc extrusion with small residual protrusion. Degenerative lumbar spine resulting in mild canal stenosis L4-5, similar grade 1 L4-5 anterolisthesis on degenerative basis. Neural foraminal narrowing L3-4 through L5-S1: Moderate to severe on the RIGHT at L4-5. Preliminary acute findings discussed with and reconfirmed by PA.JAIME WARD on 09/20/2016 at 5:35 pm. Electronically Signed   By: Elon Alas M.D.   On: 09/20/2016 19:19   Dg Fluoro Guide Lumbar Puncture  Result Date: 09/22/2016 CLINICAL DATA:  Myelitis. EXAM: DIAGNOSTIC LUMBAR PUNCTURE UNDER FLUOROSCOPIC GUIDANCE FLUOROSCOPY TIME:  Fluoroscopy Time:  12 seconds Radiation Exposure Index (if provided by the fluoroscopic device): 21.25 microGray*m^2 Number of Acquired Spot Images: 0 PROCEDURE: Informed consent was obtained from the patient prior to the procedure, including potential complications of headache, allergy, and pain. With the patient  prone, the lower back was prepped with Betadine. 1% Lidocaine was used for local anesthesia. Lumbar puncture was performed at the L3-4 level using a 3.5 inch 20 gauge needle via a right interlaminar approach with return of clear CSF with an opening pressure of 9 cm water (measured in the left lateral decubitus position). 12 ml of CSF were obtained for laboratory studies. The patient tolerated the procedure well and there were no apparent complications. IMPRESSION: Successful fluoroscopic guided lumbar puncture. Electronically Signed   By: Logan Bores M.D.   On: 09/22/2016 18:21    Microbiology: Recent Results (from the past 240 hour(s))  CSF culture     Status: None   Collection Time: 09/22/16  5:57 PM  Result Value Ref Range Status   Specimen Description CSF  Final   Special Requests NONE  Final   Gram Stain   Final    WBC PRESENT, PREDOMINANTLY MONONUCLEAR NO ORGANISMS SEEN CYTOSPIN SMEAR    Culture NO GROWTH 3 DAYS  Final   Report Status 09/26/2016 FINAL  Final     Labs: Basic Metabolic Panel:  Recent Labs Lab 09/21/16 0655 09/22/16 0435 09/23/16 0348 09/24/16 0452 09/25/16 0508  NA 131* 134* 138 136  134*  K 4.3 4.6 3.4* 3.3* 4.9  CL 99* 104 103 102 101  CO2 20* 18* 24 25 27   GLUCOSE 117* 144* 144* 167* 131*  BUN 19 15 16 13 14   CREATININE 0.84 0.70 0.78 0.75 0.73  CALCIUM 9.0 8.8* 9.0 8.7* 9.0   Liver Function Tests:  Recent Labs Lab 09/21/16 0655 09/23/16 0348 09/24/16 0452  AST 34 31 30  ALT 34 29 30  ALKPHOS 62 56 54  BILITOT 1.1 0.6 0.4  PROT 6.4* 5.7* 5.5*  ALBUMIN 3.7 3.3* 3.1*   No results for input(s): LIPASE, AMYLASE in the last 168 hours. No results for input(s): AMMONIA in the last 168 hours. CBC:  Recent Labs Lab 09/20/16 1420 09/21/16 0655 09/23/16 0348 09/24/16 0452 09/25/16 0508  WBC 10.9* 8.8 15.7* 11.4* 12.2*  NEUTROABS 7.4 7.7  --   --   --   HGB 15.8* 14.7 13.3 12.6 13.0  HCT 47.4* 43.5 39.4 37.1 37.9  MCV 95.0 93.5 91.8 92.8  92.0  PLT 272 221 210 174 194   Cardiac Enzymes:  Recent Labs Lab 09/20/16 1420  CKTOTAL 200   BNP: BNP (last 3 results) No results for input(s): BNP in the last 8760 hours.  ProBNP (last 3 results) No results for input(s): PROBNP in the last 8760 hours.  CBG: No results for input(s): GLUCAP in the last 168 hours.     SignedIrine Seal MD.  [facility] 09/26/2016, 1:28 PM

## 2016-09-26 NOTE — Care Management Note (Signed)
Case Management Note  Patient Details  Name: [name] MRN: [id number] Date of Birth: June 17, 1946  Subjective/Objective:                    Action/Plan: CM spoke to [facility] with [facility] DME and he is delivering the walker to the patients room. CM spoke to [facility] with [facility] IV therapy and the patient is all set up with [facility] and will receive a visit tonight. Pt has transportation home.   Expected Discharge Date:  09/26/16               Expected Discharge Plan:  [place]  In-House Referral:  NA  Discharge planning Services  CM Consult  Post Acute Care Choice:  Home Health Choice offered to:  Patient  DME Arranged:  Walker rolling DME Agency:  [place] Arranged:  PT, RN [facility] Agency:  [place]  Status of Service:  Completed, signed off  If discussed at [place] of Stay Meetings, dates discussed:    Additional Comments:  Pollie Friar, RN 09/26/2016, 2:23 PM

## 2016-09-27 DIAGNOSIS — R202 Paresthesia of skin: Secondary | ICD-10-CM | POA: Diagnosis not present

## 2016-09-27 DIAGNOSIS — Z452 Encounter for adjustment and management of vascular access device: Secondary | ICD-10-CM | POA: Diagnosis not present

## 2016-09-27 DIAGNOSIS — E871 Hypo-osmolality and hyponatremia: Secondary | ICD-10-CM | POA: Diagnosis not present

## 2016-09-27 DIAGNOSIS — B029 Zoster without complications: Secondary | ICD-10-CM | POA: Diagnosis not present

## 2016-09-27 DIAGNOSIS — R32 Unspecified urinary incontinence: Secondary | ICD-10-CM | POA: Diagnosis not present

## 2016-09-27 DIAGNOSIS — M549 Dorsalgia, unspecified: Secondary | ICD-10-CM | POA: Diagnosis not present

## 2016-09-27 DIAGNOSIS — Z792 Long term (current) use of antibiotics: Secondary | ICD-10-CM | POA: Diagnosis not present

## 2016-09-27 DIAGNOSIS — Z5181 Encounter for therapeutic drug level monitoring: Secondary | ICD-10-CM | POA: Diagnosis not present

## 2016-09-27 DIAGNOSIS — F419 Anxiety disorder, unspecified: Secondary | ICD-10-CM | POA: Diagnosis not present

## 2016-09-27 DIAGNOSIS — G959 Disease of spinal cord, unspecified: Secondary | ICD-10-CM | POA: Diagnosis not present

## 2016-09-28 NOTE — Consult Note (Signed)
            [facility] CM Primary Care Navigator  09/28/2016  Katelyn Lamb [birth year] [id number]   Attempt to see patient at the bedside to identify possible discharge needsbut she was already discharged to home per staff.  Primary care provider's office called [name]) to notify of patient's discharge and need for post hospital follow-up and transition of care.  Made aware to refer patient to [facility] care management for care coordination needs if deemed appropriate for services  For additional questions please contact:  Edwena Felty A. Dreden Rivere, BSN, RN-BC [facility] PRIMARY CARE Navigator Cell: [phone]

## 2016-10-01 DIAGNOSIS — M86141 Other acute osteomyelitis, right hand: Secondary | ICD-10-CM | POA: Diagnosis not present

## 2016-10-01 DIAGNOSIS — Z5181 Encounter for therapeutic drug level monitoring: Secondary | ICD-10-CM | POA: Diagnosis not present

## 2016-10-01 DIAGNOSIS — M549 Dorsalgia, unspecified: Secondary | ICD-10-CM | POA: Diagnosis not present

## 2016-10-01 DIAGNOSIS — G959 Disease of spinal cord, unspecified: Secondary | ICD-10-CM | POA: Diagnosis not present

## 2016-10-01 DIAGNOSIS — R202 Paresthesia of skin: Secondary | ICD-10-CM | POA: Diagnosis not present

## 2016-10-02 ENCOUNTER — Inpatient Hospital Stay (HOSPITAL_COMMUNITY)
Admission: EM | Admit: 2016-10-02 | Discharge: 2016-10-08 | DRG: 098 | Disposition: A | Discharge status: Rehabilitation Facility | Payer: PPO | Attending: Family Medicine | Admitting: Family Medicine

## 2016-10-02 ENCOUNTER — Telehealth: Payer: Self-pay | Admitting: Neurology

## 2016-10-02 ENCOUNTER — Encounter (HOSPITAL_COMMUNITY): Payer: Self-pay

## 2016-10-02 DIAGNOSIS — Z885 Allergy status to narcotic agent status: Secondary | ICD-10-CM

## 2016-10-02 DIAGNOSIS — R278 Other lack of coordination: Secondary | ICD-10-CM | POA: Diagnosis not present

## 2016-10-02 DIAGNOSIS — R32 Unspecified urinary incontinence: Secondary | ICD-10-CM | POA: Diagnosis not present

## 2016-10-02 DIAGNOSIS — K219 Gastro-esophageal reflux disease without esophagitis: Secondary | ICD-10-CM | POA: Diagnosis not present

## 2016-10-02 DIAGNOSIS — M62838 Other muscle spasm: Secondary | ICD-10-CM

## 2016-10-02 DIAGNOSIS — R262 Difficulty in walking, not elsewhere classified: Secondary | ICD-10-CM | POA: Diagnosis not present

## 2016-10-02 DIAGNOSIS — D696 Thrombocytopenia, unspecified: Secondary | ICD-10-CM | POA: Diagnosis present

## 2016-10-02 DIAGNOSIS — M5489 Other dorsalgia: Secondary | ICD-10-CM | POA: Diagnosis not present

## 2016-10-02 DIAGNOSIS — Z90710 Acquired absence of both cervix and uterus: Secondary | ICD-10-CM

## 2016-10-02 DIAGNOSIS — F419 Anxiety disorder, unspecified: Secondary | ICD-10-CM | POA: Diagnosis not present

## 2016-10-02 DIAGNOSIS — G373 Acute transverse myelitis in demyelinating disease of central nervous system: Secondary | ICD-10-CM | POA: Diagnosis present

## 2016-10-02 DIAGNOSIS — Z7401 Bed confinement status: Secondary | ICD-10-CM | POA: Diagnosis not present

## 2016-10-02 DIAGNOSIS — R200 Anesthesia of skin: Secondary | ICD-10-CM | POA: Diagnosis not present

## 2016-10-02 DIAGNOSIS — Z880 Allergy status to penicillin: Secondary | ICD-10-CM | POA: Diagnosis not present

## 2016-10-02 DIAGNOSIS — B0082 Herpes simplex myelitis: Secondary | ICD-10-CM | POA: Diagnosis not present

## 2016-10-02 DIAGNOSIS — I6789 Other cerebrovascular disease: Secondary | ICD-10-CM | POA: Diagnosis not present

## 2016-10-02 DIAGNOSIS — G0491 Myelitis, unspecified: Secondary | ICD-10-CM

## 2016-10-02 DIAGNOSIS — M25552 Pain in left hip: Secondary | ICD-10-CM | POA: Diagnosis present

## 2016-10-02 DIAGNOSIS — G8929 Other chronic pain: Secondary | ICD-10-CM | POA: Diagnosis present

## 2016-10-02 DIAGNOSIS — Z79899 Other long term (current) drug therapy: Secondary | ICD-10-CM

## 2016-10-02 DIAGNOSIS — Z87891 Personal history of nicotine dependence: Secondary | ICD-10-CM

## 2016-10-02 DIAGNOSIS — R159 Full incontinence of feces: Secondary | ICD-10-CM | POA: Diagnosis present

## 2016-10-02 DIAGNOSIS — R531 Weakness: Secondary | ICD-10-CM | POA: Diagnosis not present

## 2016-10-02 DIAGNOSIS — M792 Neuralgia and neuritis, unspecified: Secondary | ICD-10-CM

## 2016-10-02 DIAGNOSIS — Z8249 Family history of ischemic heart disease and other diseases of the circulatory system: Secondary | ICD-10-CM | POA: Diagnosis not present

## 2016-10-02 DIAGNOSIS — K5901 Slow transit constipation: Secondary | ICD-10-CM

## 2016-10-02 DIAGNOSIS — G0490 Encephalitis and encephalomyelitis, unspecified: Secondary | ICD-10-CM | POA: Diagnosis present

## 2016-10-02 HISTORY — DX: Herpes simplex myelitis: B00.82

## 2016-10-02 LAB — COMPREHENSIVE METABOLIC PANEL
ALT: 22 U/L (ref 14–54)
AST: 19 U/L (ref 15–41)
Albumin: 2.7 g/dL — ABNORMAL LOW (ref 3.5–5.0)
Alkaline Phosphatase: 69 U/L (ref 38–126)
Anion gap: 13 (ref 5–15)
BILIRUBIN TOTAL: 0.4 mg/dL (ref 0.3–1.2)
BUN: 12 mg/dL (ref 6–20)
CO2: 25 mmol/L (ref 22–32)
Calcium: 8.5 mg/dL — ABNORMAL LOW (ref 8.9–10.3)
Chloride: 98 mmol/L — ABNORMAL LOW (ref 101–111)
Creatinine, Ser: 0.86 mg/dL (ref 0.44–1.00)
Glucose, Bld: 126 mg/dL — ABNORMAL HIGH (ref 65–99)
POTASSIUM: 3.8 mmol/L (ref 3.5–5.1)
Sodium: 136 mmol/L (ref 135–145)
TOTAL PROTEIN: 5.2 g/dL — AB (ref 6.5–8.1)

## 2016-10-02 LAB — CBC WITH DIFFERENTIAL/PLATELET
BASOS ABS: 0 10*3/uL (ref 0.0–0.1)
Basophils Relative: 0 %
EOS ABS: 0.1 10*3/uL (ref 0.0–0.7)
EOS PCT: 2 %
HCT: 39.7 % (ref 36.0–46.0)
Hemoglobin: 12.9 g/dL (ref 12.0–15.0)
LYMPHS PCT: 31 %
Lymphs Abs: 2.6 10*3/uL (ref 0.7–4.0)
MCH: 31.5 pg (ref 26.0–34.0)
MCHC: 32.5 g/dL (ref 30.0–36.0)
MCV: 96.8 fL (ref 78.0–100.0)
Monocytes Absolute: 0.8 10*3/uL (ref 0.1–1.0)
Monocytes Relative: 10 %
NEUTROS PCT: 57 %
Neutro Abs: 4.7 10*3/uL (ref 1.7–7.7)
PLATELETS: 127 10*3/uL — AB (ref 150–400)
RBC: 4.1 MIL/uL (ref 3.87–5.11)
RDW: 13.1 % (ref 11.5–15.5)
WBC: 8.2 10*3/uL (ref 4.0–10.5)

## 2016-10-02 MED ORDER — SODIUM CHLORIDE 0.9 % IV SOLN
INTRAVENOUS | Status: DC
  Administered 2016-10-02 – 2016-10-08 (×6): via INTRAVENOUS

## 2016-10-02 MED ORDER — METHYLPREDNISOLONE SODIUM SUCC 125 MG IJ SOLR: 125.0000 mg | Freq: Once | INTRAMUSCULAR | Status: DC

## 2016-10-02 MED ORDER — ONDANSETRON HCL 4 MG/2ML IJ SOLN
4.0000 mg | Freq: Once | INTRAMUSCULAR | Status: AC
  Administered 2016-10-02: 4 mg via INTRAVENOUS
  Filled 2016-10-02: qty 2

## 2016-10-02 MED ORDER — SODIUM CHLORIDE 0.9 % IV SOLN
1000.0000 mg | Freq: Once | INTRAVENOUS | Status: AC
  Administered 2016-10-02: 1000 mg via INTRAVENOUS
  Filled 2016-10-02: qty 8

## 2016-10-02 MED ORDER — SODIUM CHLORIDE 0.9 % IV BOLUS (SEPSIS)
500.0000 mL | Freq: Once | INTRAVENOUS | Status: AC
  Administered 2016-10-02: 500 mL via INTRAVENOUS

## 2016-10-02 MED ORDER — MORPHINE SULFATE (PF) 4 MG/ML IV SOLN
2.0000 mg | Freq: Once | INTRAVENOUS | Status: AC
  Administered 2016-10-02: 2 mg via INTRAVENOUS
  Filled 2016-10-02: qty 1

## 2016-10-02 NOTE — Telephone Encounter (Signed)
Patients husband called office and was advised of message from RN below.  Patients husband has concerns about taking patient to the emergency room with the thought she will be turned away and not admitted as he feels she needs.

## 2016-10-02 NOTE — Telephone Encounter (Signed)
As an outpt clinic, we cannot arrange transportation for patients. If she is not able to ride in a vehicle, it is recommended that pt be transported back to the hospital via ambulance for emergency treatment.

## 2016-10-02 NOTE — Telephone Encounter (Signed)
I called the patient, talk with the husband. 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 The patient has a thoracic level sensory level, since coming home, she is losing function of the arms, the husband indicates that the arms are flailing about and she is not able to use them well. She has had a decline in her ability to ambulate, she is essentially bedridden. Pain is excruciating, she has been taken off of steroids when she was discharged to home. She has not had any seizures, no severe confusion.  Given the decline in functional level since discharge, I have recommended that the patient return to the hospital for consideration of readmission. The patient likely needs to be evaluated through infectious disease, consider restarting steroids. The patient may need inpatient rehabilitation following this hospital stay.

## 2016-10-02 NOTE — Telephone Encounter (Signed)
I called and spoke to the patient's husband. I explained that we cannot admitted to the hospital. If the patient symptoms are worsening and she is in severe pain they should take her back to the emergency room. He voiced understanding. He would like to keep the appointment on Thursday.

## 2016-10-02 NOTE — Telephone Encounter (Signed)
Husband is calling back, said her pain has started again. He is wanting to get her in ASAP.

## 2016-10-02 NOTE — ED Triage Notes (Signed)
Pt comes from home with complaints of increased pain in hands, legs, feet. Pt was admitted here for Myelitis d/t herpes simplex. Pt is being treated at home with acyclovir through a picc line with no improvement. PT husband at bedside and states he spoke with Dr. Jannifer Franklin today who advised they come back to ER. Pt husband reports she is now having increased pain, balance problems, and difficulty holding things with her hands. Pt A&Ox4. NAD. VSS

## 2016-10-02 NOTE — ED Provider Notes (Signed)
[facility] DEPT Provider Note   CSN: [id number] Arrival date & time: 10/02/16  [id number]     History   Chief Complaint Chief Complaint  Patient presents with  . Tingling    HPI Katelyn Lamb is a 71 y.o. female.  Patient status post admission January 18 through January 24 for a myelitis. During hospitalization she broke out with some vesicular lesions which seem to be consistent with shingles or herpetic infection. Final diagnosis was myelitis secondary to this. Patient MRI showed abnormalities. Patient reportedly had numbness up to the level of breast during the admission and was discharged at that level of numbness. But on steroids and on IV acyclovir was showing a fair amount of improvement. But over the last 5 days patient has gotten significantly worse. Now having difficulty walking even with a walker and her upper extremity coordination has gotten worse and is more spastic. Patient with no other new symptoms no further rash. Patient's been getting IV acyclovir at home through her PICC line and there has been a home nurse. Patient has not received steroids since discharge.      Past Medical History:  Diagnosis Date  . Anxiety   . Back pain   . 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 Abnormal MRI, spinal cord   . Encephalitis and encephalomyelitis   . 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 Numbness   . Intractable back pain 09/20/2016  . Numbness of left lower extremity 09/20/2016  . Hyponatremia 09/20/2016  . Herpes zoster 09/20/2016    Past Surgical History:  Procedure Laterality Date  . ABDOMINAL HYSTERECTOMY    . BLADDER REPAIR    . CESAREAN SECTION    . TUBAL LIGATION      OB History    No data available       Home Medications    Prior to Admission medications   Medication Sig Start Date End Date Taking? Authorizing Provider  acyclovir 650 mg in dextrose 5 % 100 mL  Inject 650 mg into the vein every 8 (eight) hours. TAKE FOR 16 MORE DAYS. 09/26/16 10/12/16 Yes Eugenie Filler, MD  ALPRAZolam Duanne Moron) 1 MG tablet Take 1 mg by mouth at bedtime as needed for anxiety or sleep.  03/23/14  Yes Historical Provider, MD  diazepam (VALIUM) 5 MG tablet Take 1 tablet (5 mg total) by mouth 2 (two) times daily. Patient taking differently: Take 5 mg by mouth every 12 (twelve) hours as needed for muscle spasms.  09/15/16  Yes Deno Etienne, DO  gabapentin (NEURONTIN) 100 MG capsule Take 100 mg by mouth 3 (three) times daily. 09/14/16  Yes Historical Provider, MD  HYDROcodone-acetaminophen (NORCO) 10-325 MG per tablet Take 1 tablet by mouth every 8 (eight) hours as needed for moderate pain. 06/24/14  Yes Gay Filler Copland, MD  calcium carbonate (OS-CAL) 600 MG TABS tablet Take 600 mg by mouth 2 (two) times daily with a meal.    Historical Provider, MD  cholecalciferol (VITAMIN D) 1000 units tablet Take 1,000 Units by mouth daily.    Historical Provider, MD  diphenhydramine-acetaminophen (TYLENOL PM) 25-500 MG TABS tablet Take 1 tablet by mouth at bedtime as needed (sleep/pain).    Historical Provider, MD  ketorolac (TORADOL) 10 MG tablet Take 1 tablet (10 mg total) by mouth every 6 (six) hours as needed. Patient not taking: Reported on 10/02/2016 09/09/16  Nat Christen, MD  omega-3 acid ethyl esters (LOVAZA) 1 G capsule Take by mouth 2 (two) times daily.    Historical Provider, MD    Family History Family History  Problem Relation Age of Onset  . Hypertension Mother     Social History Social History  Substance Use Topics  . Smoking status: Former Research scientist (life sciences)  . Smokeless tobacco: Never Used  . Alcohol use No     Allergies   Demerol [meperidine]; Amoxicillin-pot clavulanate; and Penicillins   Review of Systems Review of Systems  Constitutional: Negative for fever.  HENT: Negative for congestion.   Eyes: Negative for visual disturbance.  Respiratory: Negative for shortness of  breath.   Cardiovascular: Negative for chest pain.  Gastrointestinal: Negative for abdominal pain, diarrhea, nausea and vomiting.  Musculoskeletal: Positive for back pain and myalgias.  Skin: Positive for rash.  Neurological: Positive for weakness and numbness. Negative for headaches.  Hematological: Does not bruise/bleed easily.  Psychiatric/Behavioral: Negative for confusion.     Physical Exam Updated Vital Signs BP 97/79   Pulse 82   Temp 98.4 F (36.9 C) (Oral)   Resp 18   Ht 5\' 6"  (1.676 m)   Wt 63.5 kg   SpO2 98%   BMI 22.60 kg/m   Physical Exam  Constitutional: She is oriented to person, place, and time. She appears well-developed and well-nourished. No distress.  HENT:  Head: Normocephalic and atraumatic.  Mouth/Throat: Oropharynx is clear and moist.  Eyes: Conjunctivae and EOM are normal. Pupils are equal, round, and reactive to light.  Neck: Normal range of motion. Neck supple.  Cardiovascular: Normal rate and regular rhythm.   Pulmonary/Chest: Effort normal and breath sounds normal. No respiratory distress.  Abdominal: Soft. Bowel sounds are normal. There is no tenderness.  Musculoskeletal: She exhibits no edema.  Neurological: She is alert and oriented to person, place, and time. No cranial nerve deficit.  Bilateral lower extremity weakness a lot of coordination issue. Also bilateral upper extremity weakness with a lot of coordination abnormalities. Patient states that she has numbness feeling up to the level of her breasts.  Skin: Rash noted.  Examination of her skin shows some drying lesions consistent with fascicular lesions on her left upper back area. No other of fascicular lesions noted at this time.  Nursing note and vitals reviewed.    ED Treatments / Results  Labs (all labs ordered are listed, but only abnormal results are displayed) Labs Reviewed  COMPREHENSIVE METABOLIC PANEL - Abnormal; Notable for the following:       Result Value   Chloride  98 (*)    Glucose, Bld 126 (*)    Calcium 8.5 (*)    Total Protein 5.2 (*)    Albumin 2.7 (*)    All other components within normal limits  CBC WITH DIFFERENTIAL/PLATELET - Abnormal; Notable for the following:    Platelets 127 (*)    All other components within normal limits   Results for orders placed or performed during the hospital encounter of 10/02/16  Comprehensive metabolic panel  Result Value Ref Range   Sodium 136 135 - 145 mmol/L   Potassium 3.8 3.5 - 5.1 mmol/L   Chloride 98 (L) 101 - 111 mmol/L   CO2 25 22 - 32 mmol/L   Glucose, Bld 126 (H) 65 - 99 mg/dL   BUN 12 6 - 20 mg/dL   Creatinine, Ser 0.86 0.44 - 1.00 mg/dL   Calcium 8.5 (L) 8.9 - 10.3 mg/dL   Total  Protein 5.2 (L) 6.5 - 8.1 g/dL   Albumin 2.7 (L) 3.5 - 5.0 g/dL   AST 19 15 - 41 U/L   ALT 22 14 - 54 U/L   Alkaline Phosphatase 69 38 - 126 U/L   Total Bilirubin 0.4 0.3 - 1.2 mg/dL   GFR calc non Af Amer >60 >60 mL/min   GFR calc Af Amer >60 >60 mL/min   Anion gap 13 5 - 15  CBC with Differential/Platelet  Result Value Ref Range   WBC 8.2 4.0 - 10.5 K/uL   RBC 4.10 3.87 - 5.11 MIL/uL   Hemoglobin 12.9 12.0 - 15.0 g/dL   HCT 39.7 36.0 - 46.0 %   MCV 96.8 78.0 - 100.0 fL   MCH 31.5 26.0 - 34.0 pg   MCHC 32.5 30.0 - 36.0 g/dL   RDW 13.1 11.5 - 15.5 %   Platelets 127 (L) 150 - 400 K/uL   Neutrophils Relative % 57 %   Neutro Abs 4.7 1.7 - 7.7 K/uL   Lymphocytes Relative 31 %   Lymphs Abs 2.6 0.7 - 4.0 K/uL   Monocytes Relative 10 %   Monocytes Absolute 0.8 0.1 - 1.0 K/uL   Eosinophils Relative 2 %   Eosinophils Absolute 0.1 0.0 - 0.7 K/uL   Basophils Relative 0 %   Basophils Absolute 0.0 0.0 - 0.1 K/uL     EKG  EKG Interpretation None       Radiology No results found.  Procedures Procedures (including critical care time)  Medications Ordered in ED Medications  0.9 %  sodium chloride infusion ( Intravenous New Bag/Given 10/02/16 2145)  sodium chloride 0.9 % bolus 500 mL (500 mLs  Intravenous New Bag/Given 10/02/16 2145)  ondansetron (ZOFRAN) injection 4 mg (4 mg Intravenous Given 10/02/16 2145)  morphine 4 MG/ML injection 2 mg (2 mg Intravenous Given 10/02/16 2145)  methylPREDNISolone sodium succinate (SOLU-MEDROL) 1,000 mg in sodium chloride 0.9 % 50 mL IVPB (1,000 mg Intravenous Given 10/02/16 2216)     Initial Impression / Assessment and Plan / ED Course  I have reviewed the triage vital signs and the nursing notes.  Pertinent labs & imaging results that were available during my care of the patient were reviewed by me and considered in my medical decision making (see chart for details).   patient with recent hospitalization January 18 through January 24 for myelitis secondary to herpes simplex infection. They have been secondary shingles. Patient during hospitalization had a PICC line placed was treated with acyclovir and steroids. Discharged home on just IV acyclovir. Since being home patient has gotten progressively worse more so in the last few days. Where her ability to walk in the use of her arms is gotten worse. Patient states that the numbness at the level of the chest is unchanged from discharge. As stated above patient was not discharged home on steroids.  Patient was due to be followed up by Dr. Jannifer [facility] from [facility] neurology. Had appointment next 2 days. When she got sniffily worse they called today and recommended she come back in for evaluation. On my exam and listening to history of inpatient [place] a require readmission. At the advice of our neuro hospitalist I started her on 1000 mg Solu-Medrol IV and gave her some pain medicine. Patient's labs without any significant abnormalities.  Awaiting formal neurology consultation.    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

## 2016-10-02 NOTE — Telephone Encounter (Signed)
Patients husband called and stated that the patient is in extreme pain. They were referred to Dr. Jannifer Franklin but the soonest available apt was on Monday the 5th. He states that they are at a loss of what to do at this point because the patient needs to be seen as soon as possible. She is avoiding drinking any fluids because she is worried that she wont be able to get up and make it to the bathroom due to the extreme pain. They have been to the ER 3 times. He states that she has had to wait 7 hours to be seen in the ER. She has been diagnosed with a viral infection in her spine that has spread to her brain. He wants to know if there is any way to be seen earlier or what they should do. He doesn't want to take her back to ER because the wait is so hard for her. She has medication for the pain that have been helping her up until today. He would really like for her to see Dr. Jannifer Franklin but they are willing to see someone else if that is the only option. Please call and advise.

## 2016-10-03 ENCOUNTER — Inpatient Hospital Stay (HOSPITAL_COMMUNITY): Payer: PPO

## 2016-10-03 ENCOUNTER — Encounter (HOSPITAL_COMMUNITY): Payer: Self-pay | Admitting: *Deleted

## 2016-10-03 DIAGNOSIS — G8250 Quadriplegia, unspecified: Secondary | ICD-10-CM | POA: Diagnosis not present

## 2016-10-03 DIAGNOSIS — D696 Thrombocytopenia, unspecified: Secondary | ICD-10-CM | POA: Diagnosis not present

## 2016-10-03 DIAGNOSIS — D62 Acute posthemorrhagic anemia: Secondary | ICD-10-CM | POA: Diagnosis not present

## 2016-10-03 DIAGNOSIS — Z881 Allergy status to other antibiotic agents status: Secondary | ICD-10-CM

## 2016-10-03 DIAGNOSIS — M25552 Pain in left hip: Secondary | ICD-10-CM | POA: Diagnosis not present

## 2016-10-03 DIAGNOSIS — Z7401 Bed confinement status: Secondary | ICD-10-CM | POA: Diagnosis not present

## 2016-10-03 DIAGNOSIS — Z880 Allergy status to penicillin: Secondary | ICD-10-CM

## 2016-10-03 DIAGNOSIS — Z79899 Other long term (current) drug therapy: Secondary | ICD-10-CM | POA: Diagnosis not present

## 2016-10-03 DIAGNOSIS — K5901 Slow transit constipation: Secondary | ICD-10-CM | POA: Diagnosis not present

## 2016-10-03 DIAGNOSIS — K5903 Drug induced constipation: Secondary | ICD-10-CM | POA: Diagnosis not present

## 2016-10-03 DIAGNOSIS — G0491 Myelitis, unspecified: Secondary | ICD-10-CM | POA: Diagnosis not present

## 2016-10-03 DIAGNOSIS — G8929 Other chronic pain: Secondary | ICD-10-CM | POA: Diagnosis not present

## 2016-10-03 DIAGNOSIS — M7989 Other specified soft tissue disorders: Secondary | ICD-10-CM | POA: Diagnosis not present

## 2016-10-03 DIAGNOSIS — M5124 Other intervertebral disc displacement, thoracic region: Secondary | ICD-10-CM | POA: Diagnosis not present

## 2016-10-03 DIAGNOSIS — R339 Retention of urine, unspecified: Secondary | ICD-10-CM | POA: Diagnosis not present

## 2016-10-03 DIAGNOSIS — G0490 Encephalitis and encephalomyelitis, unspecified: Secondary | ICD-10-CM | POA: Diagnosis not present

## 2016-10-03 DIAGNOSIS — M62838 Other muscle spasm: Secondary | ICD-10-CM | POA: Diagnosis not present

## 2016-10-03 DIAGNOSIS — R278 Other lack of coordination: Secondary | ICD-10-CM | POA: Diagnosis not present

## 2016-10-03 DIAGNOSIS — F418 Other specified anxiety disorders: Secondary | ICD-10-CM | POA: Diagnosis not present

## 2016-10-03 DIAGNOSIS — Z885 Allergy status to narcotic agent status: Secondary | ICD-10-CM | POA: Diagnosis not present

## 2016-10-03 DIAGNOSIS — F419 Anxiety disorder, unspecified: Secondary | ICD-10-CM | POA: Diagnosis not present

## 2016-10-03 DIAGNOSIS — M549 Dorsalgia, unspecified: Secondary | ICD-10-CM | POA: Diagnosis not present

## 2016-10-03 DIAGNOSIS — N319 Neuromuscular dysfunction of bladder, unspecified: Secondary | ICD-10-CM | POA: Diagnosis not present

## 2016-10-03 DIAGNOSIS — F329 Major depressive disorder, single episode, unspecified: Secondary | ICD-10-CM | POA: Diagnosis not present

## 2016-10-03 DIAGNOSIS — R2689 Other abnormalities of gait and mobility: Secondary | ICD-10-CM | POA: Diagnosis not present

## 2016-10-03 DIAGNOSIS — M5489 Other dorsalgia: Secondary | ICD-10-CM | POA: Diagnosis not present

## 2016-10-03 DIAGNOSIS — G373 Acute transverse myelitis in demyelinating disease of central nervous system: Secondary | ICD-10-CM | POA: Diagnosis not present

## 2016-10-03 DIAGNOSIS — I82432 Acute embolism and thrombosis of left popliteal vein: Secondary | ICD-10-CM | POA: Diagnosis not present

## 2016-10-03 DIAGNOSIS — B029 Zoster without complications: Secondary | ICD-10-CM | POA: Diagnosis not present

## 2016-10-03 DIAGNOSIS — G8222 Paraplegia, incomplete: Secondary | ICD-10-CM | POA: Diagnosis not present

## 2016-10-03 DIAGNOSIS — M5136 Other intervertebral disc degeneration, lumbar region: Secondary | ICD-10-CM | POA: Diagnosis not present

## 2016-10-03 DIAGNOSIS — Z87891 Personal history of nicotine dependence: Secondary | ICD-10-CM | POA: Diagnosis not present

## 2016-10-03 DIAGNOSIS — Z90710 Acquired absence of both cervix and uterus: Secondary | ICD-10-CM | POA: Diagnosis not present

## 2016-10-03 DIAGNOSIS — M792 Neuralgia and neuritis, unspecified: Secondary | ICD-10-CM | POA: Diagnosis not present

## 2016-10-03 DIAGNOSIS — R159 Full incontinence of feces: Secondary | ICD-10-CM | POA: Diagnosis not present

## 2016-10-03 DIAGNOSIS — R270 Ataxia, unspecified: Secondary | ICD-10-CM | POA: Diagnosis not present

## 2016-10-03 DIAGNOSIS — R262 Difficulty in walking, not elsewhere classified: Secondary | ICD-10-CM | POA: Diagnosis not present

## 2016-10-03 DIAGNOSIS — R300 Dysuria: Secondary | ICD-10-CM | POA: Diagnosis not present

## 2016-10-03 DIAGNOSIS — M50322 Other cervical disc degeneration at C5-C6 level: Secondary | ICD-10-CM | POA: Diagnosis not present

## 2016-10-03 DIAGNOSIS — R200 Anesthesia of skin: Secondary | ICD-10-CM | POA: Diagnosis not present

## 2016-10-03 DIAGNOSIS — Z888 Allergy status to other drugs, medicaments and biological substances status: Secondary | ICD-10-CM | POA: Diagnosis not present

## 2016-10-03 DIAGNOSIS — R531 Weakness: Secondary | ICD-10-CM | POA: Diagnosis not present

## 2016-10-03 DIAGNOSIS — Z8249 Family history of ischemic heart disease and other diseases of the circulatory system: Secondary | ICD-10-CM

## 2016-10-03 DIAGNOSIS — R32 Unspecified urinary incontinence: Secondary | ICD-10-CM | POA: Diagnosis not present

## 2016-10-03 DIAGNOSIS — R109 Unspecified abdominal pain: Secondary | ICD-10-CM | POA: Diagnosis not present

## 2016-10-03 DIAGNOSIS — K219 Gastro-esophageal reflux disease without esophagitis: Secondary | ICD-10-CM | POA: Diagnosis not present

## 2016-10-03 DIAGNOSIS — B0082 Herpes simplex myelitis: Secondary | ICD-10-CM | POA: Diagnosis not present

## 2016-10-03 DIAGNOSIS — N390 Urinary tract infection, site not specified: Secondary | ICD-10-CM | POA: Diagnosis not present

## 2016-10-03 LAB — CBC WITH DIFFERENTIAL/PLATELET
BASOS ABS: 0 10*3/uL (ref 0.0–0.1)
Basophils Relative: 0 %
EOS ABS: 0 10*3/uL (ref 0.0–0.7)
Eosinophils Relative: 0 %
HCT: 40.2 % (ref 36.0–46.0)
Hemoglobin: 13.3 g/dL (ref 12.0–15.0)
Lymphocytes Relative: 8 %
Lymphs Abs: 0.5 10*3/uL — ABNORMAL LOW (ref 0.7–4.0)
MCH: 31.2 pg (ref 26.0–34.0)
MCHC: 33.1 g/dL (ref 30.0–36.0)
MCV: 94.4 fL (ref 78.0–100.0)
Monocytes Absolute: 0 10*3/uL — ABNORMAL LOW (ref 0.1–1.0)
Monocytes Relative: 0 %
Neutro Abs: 5.7 10*3/uL (ref 1.7–7.7)
Neutrophils Relative %: 92 %
PLATELETS: 135 10*3/uL — AB (ref 150–400)
RBC: 4.26 MIL/uL (ref 3.87–5.11)
RDW: 13 % (ref 11.5–15.5)
WBC: 6.3 10*3/uL (ref 4.0–10.5)

## 2016-10-03 MED ORDER — ACETAMINOPHEN 650 MG RE SUPP: 650.0000 mg | Freq: Four times a day (QID) | RECTAL | Status: DC | PRN

## 2016-10-03 MED ORDER — DIAZEPAM 5 MG PO TABS
5.0000 mg | ORAL_TABLET | Freq: Two times a day (BID) | ORAL | Status: DC | PRN
  Administered 2016-10-04 – 2016-10-07 (×5): 5 mg via ORAL
  Filled 2016-10-03 (×5): qty 1

## 2016-10-03 MED ORDER — HYDROCODONE-ACETAMINOPHEN 10-325 MG PO TABS
1.0000 | ORAL_TABLET | ORAL | Status: DC | PRN
  Administered 2016-10-03 – 2016-10-08 (×22): 1 via ORAL
  Filled 2016-10-03 (×23): qty 1

## 2016-10-03 MED ORDER — DIPHENHYDRAMINE-APAP (SLEEP) 25-500 MG PO TABS: 1.0000 | ORAL_TABLET | Freq: Every evening | ORAL | Status: DC | PRN

## 2016-10-03 MED ORDER — GABAPENTIN 100 MG PO CAPS
100.0000 mg | ORAL_CAPSULE | Freq: Three times a day (TID) | ORAL | Status: DC
  Administered 2016-10-03 – 2016-10-08 (×17): 100 mg via ORAL
  Filled 2016-10-03 (×18): qty 1

## 2016-10-03 MED ORDER — ONDANSETRON HCL 4 MG/2ML IJ SOLN: 4.0000 mg | Freq: Four times a day (QID) | INTRAMUSCULAR | Status: DC | PRN

## 2016-10-03 MED ORDER — ENOXAPARIN SODIUM 40 MG/0.4ML ~~LOC~~ SOLN
40.0000 mg | SUBCUTANEOUS | Status: DC
  Administered 2016-10-03 – 2016-10-08 (×6): 40 mg via SUBCUTANEOUS
  Filled 2016-10-03 (×7): qty 0.4

## 2016-10-03 MED ORDER — PANTOPRAZOLE SODIUM 40 MG PO TBEC
40.0000 mg | DELAYED_RELEASE_TABLET | Freq: Every day | ORAL | Status: DC
  Administered 2016-10-03 – 2016-10-08 (×6): 40 mg via ORAL
  Filled 2016-10-03 (×6): qty 1

## 2016-10-03 MED ORDER — MORPHINE SULFATE (PF) 4 MG/ML IV SOLN
2.0000 mg | INTRAVENOUS | Status: DC | PRN
  Administered 2016-10-03 – 2016-10-08 (×6): 2 mg via INTRAVENOUS
  Filled 2016-10-03 (×6): qty 1

## 2016-10-03 MED ORDER — ALBUTEROL SULFATE (2.5 MG/3ML) 0.083% IN NEBU: 2.5000 mg | INHALATION_SOLUTION | RESPIRATORY_TRACT | Status: DC | PRN

## 2016-10-03 MED ORDER — ONDANSETRON HCL 4 MG PO TABS: 4.0000 mg | ORAL_TABLET | Freq: Four times a day (QID) | ORAL | Status: DC | PRN

## 2016-10-03 MED ORDER — ACETAMINOPHEN 325 MG PO TABS
650.0000 mg | ORAL_TABLET | Freq: Four times a day (QID) | ORAL | Status: DC | PRN
  Filled 2016-10-03: qty 2

## 2016-10-03 MED ORDER — SODIUM CHLORIDE 0.9 % IV SOLN
1000.0000 mg | INTRAVENOUS | Status: DC
  Administered 2016-10-03 – 2016-10-04 (×2): 1000 mg via INTRAVENOUS
  Filled 2016-10-03 (×3): qty 8

## 2016-10-03 MED ORDER — GADOBENATE DIMEGLUMINE 529 MG/ML IV SOLN
15.0000 mL | Freq: Once | INTRAVENOUS | Status: AC
  Administered 2016-10-03: 13 mL via INTRAVENOUS

## 2016-10-03 MED ORDER — HYDROCODONE-ACETAMINOPHEN 10-325 MG PO TABS
1.0000 | ORAL_TABLET | Freq: Three times a day (TID) | ORAL | Status: DC | PRN
  Administered 2016-10-03: 1 via ORAL
  Filled 2016-10-03: qty 1

## 2016-10-03 MED ORDER — MORPHINE SULFATE (PF) 4 MG/ML IV SOLN
2.0000 mg | Freq: Once | INTRAVENOUS | Status: AC
  Administered 2016-10-03: 2 mg via INTRAVENOUS
  Filled 2016-10-03: qty 1

## 2016-10-03 MED ORDER — ALPRAZOLAM 0.5 MG PO TABS
1.0000 mg | ORAL_TABLET | Freq: Every day | ORAL | Status: DC | PRN
  Filled 2016-10-03: qty 2

## 2016-10-03 MED ORDER — DEXTROSE 5 % IV SOLN
10.0000 mg/kg | Freq: Three times a day (TID) | INTRAVENOUS | Status: DC
  Administered 2016-10-03 – 2016-10-08 (×17): 650 mg via INTRAVENOUS
  Filled 2016-10-03 (×20): qty 13

## 2016-10-03 NOTE — Progress Notes (Signed)
Patient seen and evaluated earlier this AM by my associate. Please refer to H and P for details regarding assessment and plan.  Will reassess next am.  Katelyn Lamb  Gen: pt in nad, alert and awake CV: no cyanosis Pulm: no wheezes, equal chest rise.  Will contact ID

## 2016-10-03 NOTE — ED Notes (Signed)
Patient transported to MRI 

## 2016-10-03 NOTE — ED Notes (Signed)
Breakfast ordered. Paged hospitalist to inform pt is back in room from MRI

## 2016-10-03 NOTE — H&P (Signed)
History and Physical    Katelyn Lamb [id number] DOB: [birth year] DOA: 10/02/2016  Referring MD/NP/PA: Dr. Roxanne Mins PCP: [name], MD  Patient coming from: home  Chief Complaint: weakness  HPI: Katelyn Lamb is a 71 y.o. female with medical history significant of anxiety and chronic back pain; presents with a five-day history of progressively worsening weakness. Just recently hospitalized here at [facility] 1/18-1/24 for HSV encephalitis and myelitis discharged home with PICC to complete  21 day course of IV acyclovir. After being discharged home she was not continued on high-dose steroids previously given during her hospitalization. Husband notes a gradual decline the patient's overall functional status including decreased ability to walk, involuntary hand movements, and loss of coordination. Patient reports associated symptoms of numbness from the nipple line down, increased work of breathing, and feeling as though a band is wrapped around waist. Husband notes that the patient is basically bedbound and cannot even feed herself. Patient also reports return of the left hip and buttock pain yesterday that is intense and unbearable to the point she has been crying out in pain. Symptoms of bladder/bowel incontinence previously reported have improved. The herpetic rash appears to be scabbing over. Denies any chest pain, seizure-like activity, or confusion.   ED Course: Upon admission into the emergency department patient was seen to be afebrile, pulse 7898, respirations 10 and 26, blood pressure as low as 89/65, and O2 saturation maintained on room air. Lab work was relatively unremarkable except for platelet count 127 and albumin 2.1. Patient was evaluated by Dr. Cheral Marker neurology who recommended MRI and high-dose steroids.  Review of Systems: As per HPI otherwise 10 point review of systems negative.   Past Medical History:  Diagnosis Date  . Anxiety   . Back pain   . 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 ABDOMINAL HYSTERECTOMY    . BLADDER REPAIR    . CESAREAN SECTION    . TUBAL LIGATION       reports that she has quit smoking. She has never used smokeless tobacco. She reports that she does not drink alcohol or use drugs.  Allergies  Allergen Reactions  . Demerol [Meperidine] Other (See Comments)    Hallucinations  . Amoxicillin-Pot Clavulanate Other (See Comments)  . Penicillins Other (See Comments)    Family History  Problem Relation Age of Onset  . Hypertension Mother     Prior to Admission medications   Medication Sig Start Date End Date Taking? Authorizing Provider  acyclovir 650 mg in dextrose 5 % 100 mL Inject 650 mg into the vein every 8 (eight) hours. TAKE FOR 16 MORE DAYS. 09/26/16 10/12/16 Yes Eugenie Filler, MD  ALPRAZolam Duanne Moron) 1 MG tablet Take 1 mg by mouth at bedtime as needed for anxiety or sleep.  03/23/14  Yes Historical Provider, MD  diazepam (VALIUM) 5 MG tablet Take 1 tablet (5 mg total) by mouth 2 (two) times daily. Patient taking differently: Take 5 mg by mouth every 12 (twelve) hours as needed for muscle spasms.  09/15/16  Yes Deno Etienne, DO  gabapentin (NEURONTIN) 100 MG capsule Take 100 mg by mouth 3 (three) times daily. 09/14/16  Yes Historical Provider, MD  HYDROcodone-acetaminophen (NORCO) 10-325 MG per tablet Take 1 tablet by mouth every 8 (eight) hours as needed for moderate pain. 06/24/14  Yes Gay Filler Copland, MD  calcium carbonate (OS-CAL) 600 MG TABS tablet Take 600 mg by  mouth 2 (two) times daily with a meal.    Historical Provider, MD  cholecalciferol (VITAMIN D) 1000 units tablet Take 1,000 Units by mouth daily.    Historical Provider, MD  diphenhydramine-acetaminophen (TYLENOL PM) 25-500 MG TABS tablet Take 1 tablet by mouth at bedtime as needed (sleep/pain).    Historical Provider, MD  ketorolac (TORADOL) 10 MG tablet Take 1 tablet (10 mg total) by mouth every 6 (six)  hours as needed. Patient not taking: Reported on 10/02/2016 09/09/16   Nat Christen, MD  omega-3 acid ethyl esters (LOVAZA) 1 G capsule Take by mouth 2 (two) times daily.    Historical Provider, MD    Physical Exam:    Constitutional: Elderly female in NAD, calm, comfortable Vitals:   10/02/16 2345 10/03/16 0000 10/03/16 0015 10/03/16 0030  BP: 94/68 (!) 89/65 93/69 98/68   Pulse: 84 82 81 78  Resp: 21 20 26 22   Temp:      TempSrc:      SpO2: 97% 96% 95% 95%  Weight:      Height:       Eyes: PERRL, lids and conjunctivae normal ENMT: Mucous membranes are moist. Posterior pharynx clear of any exudate or lesions.Normal dentition.  Neck: normal, supple, no masses, no thyromegaly Respiratory: Tachypneic, but clear to auscultation bilaterally, no wheezing, no crackles.. No accessory muscle use.  Cardiovascular: Regular rate and rhythm, no murmurs / rubs / gallops. No extremity edema. 2+ pedal pulses. No carotid bruits.  Abdomen: no tenderness, no masses palpated. No hepatosplenomegaly. Bowel sounds positive.  Musculoskeletal: no clubbing / cyanosis. No joint deformity upper and lower extremities. Good ROM, no contractures. Normal muscle tone.  Skin: Scabbed over lesions of the left chest wall and knee Neurologic: CN 2-12 grossly intact. Sensation abnormal, DTR normal. Strength 4/5 in all 4. Patient with decreased coordination of the bilateral upper extremities. Psychiatric: Normal judgment and insight. Alert and oriented x 3. Normal mood.     Labs on Admission: I have personally reviewed following labs and imaging studies  CBC:  Recent Labs Lab 10/02/16 2153  WBC 8.2  NEUTROABS 4.7  HGB 12.9  HCT 39.7  MCV 96.8  PLT [id number]   Basic Metabolic Panel:  Recent Labs Lab 10/02/16 2153  NA 136  K 3.8  CL 98*  CO2 25  GLUCOSE 126*  BUN 12  CREATININE 0.86  CALCIUM 8.5*   GFR: Estimated Creatinine Clearance: 58.6 mL/min (by C-G formula based on SCr of 0.86 mg/dL). Liver  Function Tests:  Recent Labs Lab 10/02/16 2153  AST 19  ALT 22  ALKPHOS 69  BILITOT 0.4  PROT 5.2*  ALBUMIN 2.7*   No results for input(s): LIPASE, AMYLASE in the last 168 hours. No results for input(s): AMMONIA in the last 168 hours. Coagulation Profile: No results for input(s): INR, PROTIME in the last 168 hours. Cardiac Enzymes: No results for input(s): CKTOTAL, CKMB, CKMBINDEX, TROPONINI in the last 168 hours. BNP (last 3 results) No results for input(s): PROBNP in the last 8760 hours. HbA1C: No results for input(s): HGBA1C in the last 72 hours. CBG: No results for input(s): GLUCAP in the last 168 hours. Lipid Profile: No results for input(s): CHOL, HDL, LDLCALC, TRIG, CHOLHDL, LDLDIRECT in the last 72 hours. Thyroid Function Tests: No results for input(s): TSH, T4TOTAL, FREET4, T3FREE, THYROIDAB in the last 72 hours. Anemia Panel: No results for input(s): VITAMINB12, FOLATE, FERRITIN, TIBC, IRON, RETICCTPCT in the last 72 hours. Urine analysis:    Component Value Date/Time  COLORURINE YELLOW 09/21/2016 1339   APPEARANCEUR HAZY (A) 09/21/2016 1339   LABSPEC 1.020 09/21/2016 1339   PHURINE 5.0 09/21/2016 1339   GLUCOSEU 50 (A) 09/21/2016 1339   HGBUR SMALL (A) 09/21/2016 1339   BILIRUBINUR NEGATIVE 09/21/2016 1339   KETONESUR 20 (A) 09/21/2016 1339   PROTEINUR NEGATIVE 09/21/2016 1339   NITRITE NEGATIVE 09/21/2016 1339   LEUKOCYTESUR TRACE (A) 09/21/2016 1339   Sepsis Labs: No results found for this or any previous visit (from the past 240 hour(s)).   Radiological Exams on Admission: No results found.   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 - Admit to a MedSurg bed - Continue Acyclovir IV, gabapentin, Valium prn muscle spasms - IVF NS at 75 mL per hour - Continue Solu-Medrol 1 g daily for total 5 days course. - Follow-up MRI studies - May  warrant reconsultation ID in a.m.  - PT/OT to eval and treat - Appreciate neurology consultative services, follow-up further recommendations - Continue pulse oximetry for bandlike sensation around chest and tachypnea initially seen on admission. The ascending nature initally gave concern for Ethelene Hal syndorme.  Anxiety  - Xanax prn anxiety/sleep  Thrombocytopenia: Acute. The patient's platelet count was noted to be 127 on admission.  - Recheck CBC  GI prophylaxis: protonix DVT prophylaxis: Lovenox   Code Status: Full  Family Communication: Discussed plan of care with the patient and her husband present at bedside  Disposition Plan: TBD Consults called: Neurology Admission status: Inpatient  Norval Morton MD [facility] Pager [phone]  If 7PM-7AM, please contact night-coverage [email or web address] Password [email or web address]  10/03/2016, 2:05 AM

## 2016-10-03 NOTE — Progress Notes (Signed)
Received pt from ED, A&O x4. BUE weakness noted. Old lesions to left back, thigh and knee with dry scabbing. Pain noted to BUE,BLE and back, prn meds given.

## 2016-10-03 NOTE — ED Notes (Signed)
Patient signed out to me pending official neurology consultation. Presented with myelitis felt to be herpetic, but now with complex neurologic findings not clearly attributable to myelitis. MRIs have been ordered looking for other lesions. I discussed the case with Dr. Cheral Marker of neurology service who requests the patient be admitted to the internal medicine service. She is already received high-dose methylprednisolone. Case is discussed with Dr. Tamala Julian of [facility] who agrees to admit the patient.   Delora Fuel, MD [phone]

## 2016-10-03 NOTE — Progress Notes (Signed)
Subjective: Has multiple complaints today about hip pain, knee pain, joint pain. But she does state that she is feeling stronger today.  Exam: Vitals:   10/03/16 0836 10/03/16 1234  BP: 100/60 104/71  Pulse: 79 81  Resp:  18  Temp: 98.9 F (37.2 C) 97.6 F (36.4 C)    Gen: In bed, NAD MS: Patient is alert and oriented following all commands CN: Cranial nerves II through XII grossly intact Motor: Lower extremities are 5/5. Upper extremities show proximal weakness with 4/5 with distal strength of 5/5. Of note with finger-nose she does have dysmetria on bilateral arms. Sensory: Sensory is grossly intact Deep tendon reflexes are 3+ throughout  Pertinent Labs/Diagnostics: MRI brain was obtained today shows cortically based signal abnormality in the paramedian frontal and cingulate gyri has overall improved. There is no residual diffusion abnormality in the region and only minimal cortical enhancement remains. Abnormal flare signal in the region is similar to the prior study. No new regions of signal normality are identified in the brain and there is no evidence of acute infarct. An cervical spine abnormal T2 hyperintensity within the cord from C2-C7 has decreased in intensity, and the cord expansion has decreased also. Associated enhancement in the cord from C2-C4 has increased from prior study and is more confluent with greatest involvement of the dorsal cord. No new cord lesions  Etta Quill PA-C [facility] [phone]  Impression: This is a 71 year old female with recent diagnosis of HSV-2 encephalomyelitis. She currently is on IV acyclovir at home but presented to the ED with worsening leg pain, sensory loss and gait instability. She also had worsening of upper extremity weakness and coordination. Imaging reveals improved spinal lesions, but there is evidence of possible worsening inflammation in the cervical area which is where her symptoms would localize.  I think that it  could be reasonable to rigidity steroids to try and control inflammation and possibly limit further injury as we continue to treat with acyclovir.  Recommendations: 1) continue acyclovir.  2) IV methylprednisolone, 1000 mg qd.  Roland Rack, MD [facility] [phone]  If 7pm- 7am, please page neurology on call as listed in [place].  10/03/2016, 12:37 PM

## 2016-10-03 NOTE — Progress Notes (Signed)
[facility]  Katelyn Lamb is an active pt with [facility] for [facility] and pharmacy for home IV antiviral medication (Acyclovir 650 mg IV Q 8 hours) prior to this readmission.  [facility] hospital staff will follow [name] while she is an inpatient to support transition back home when ordered to ensure [facility] needs are met.   If patient discharges after hours, please call [phone].   Larry Sierras 10/03/2016, 2:30 PM

## 2016-10-03 NOTE — ED Notes (Signed)
Pt would like something else for pain.

## 2016-10-03 NOTE — Consult Note (Signed)
NEURO HOSPITALIST CONSULT NOTE   Requestig physician: Dr. Rogene Houston  Reason for Consult: Worsened gait. Recently diagnosed herpes myelitis.  History obtained from:   Patient and Chart     HPI:                                                                                                                                          Katelyn Lamb is an 71 y.o. female with recent diagnosis of HSV2 encephalomyelitis, on IV acyclovir at home, who presents with worsening leg pain, sensory loss and gait instability in addition to worsening upper extremity weakness and incoordination. Per Dr. Jannifer Franklin' outpatient note from Tuesday evening: "The patient has a thoracic level sensory level, since coming home, she is losing function of the arms, the husband indicates that the arms are flailing about and she is not able to use them well. She has had a decline in her ability to ambulate, she is essentially bedridden. Pain is excruciating, she has been taken off of steroids when she was discharged to home. She has not had any seizures, no severe confusion." Dr. Jannifer Franklin' note documents that he feels she should be evaluated by ID and to consider restarting steroids.   She was recently admitted from January 18 through 24 for worsened gait and ascending muscle pains. She was diagnosed with HSV myelitis and started on steroids and acyclovir, with initial improvement. She was imaged and also had an LP that admission. Diagnosis of HSV myelitis was favored given LP results, MRI results and findings on skin exam of vesicular lesions at different locations that appeared consistent with shingles or herpetic infection. She had numbness up to breast level bilaterally and continues to have a sensory level to just below the nipples, at about T5 - T6. She states that the initial symptoms began about December 17 in her lower legs, with intermittent pain, paresthesias and weakness in a patchy distribution that then  ascended.   In the ED this evening, the patient states that despite being treated at home with acyclovir through a PICC line, she has worsened. She also endorses worsened bowel and bladder function with incontinence issues.  Past Medical History:  Diagnosis Date  . Anxiety   . Back pain   . Myelitis due to herpes simplex [facility])     Past Surgical History:  Procedure Laterality Date  . ABDOMINAL HYSTERECTOMY    . BLADDER REPAIR    . CESAREAN SECTION    . TUBAL LIGATION      Family History  Problem Relation Age of Onset  . Hypertension Mother    Social History:  reports that she has quit smoking. She has never used smokeless tobacco. She reports that she does not drink alcohol or use drugs.  Allergies  Allergen  Reactions  . Demerol [Meperidine] Other (See Comments)    Hallucinations  . Amoxicillin-Pot Clavulanate Other (See Comments)  . Penicillins Other (See Comments)    HOME MEDICATIONS:                                                                                                                     acyclovir 650 mg in dextrose 5 % 100 mL Inject 650 mg into the vein every 8 (eight) hours. TAKE FOR 16 MORE DAYS. Norval Morton, MD Reordered  Orderedas:acyclovir (ZOVIRAX) 650 mg in dextrose 5 % 100 mL IVPB - 650 mg (10 mg/kg  65 kg), Intravenous, Administer over 60 Minutes, Every 8 hours, First dose on Wed 10/03/16 at 0300, For 10 days  ALPRAZolam (XANAX) 1 MG tablet Take 1 mg by mouth at bedtime as needed for anxiety or sleep.  Norval Morton, MD Reordered  Orderedas:ALPRAZolam Duanne Moron) tablet 1 mg - 1 mg, Oral, Daily PRN, anxiety, sleep, Starting Wed 10/03/16 at 0251  diazepam (VALIUM) 5 MG tablet Take 1 tablet (5 mg total) by mouth 2 (two) times daily. Norval Morton, MD Reordered   Patient taking differently: Take 5 mg by mouth every 12 (twelve) hours as needed for muscle spasms.     Orderedas:diazepam (VALIUM) tablet 5 mg - 5 mg, Oral, Every 12 hours PRN, muscle  spasms, Starting Wed 10/03/16 at 0245  gabapentin (NEURONTIN) 100 MG capsule Take 100 mg by mouth 3 (three) times daily. Norval Morton, MD Reordered  Orderedas:gabapentin (NEURONTIN) capsule 100 mg - 100 mg, Oral, 3 times daily, First dose on Wed 10/03/16 at 1000  HYDROcodone-acetaminophen (NORCO) 10-325 MG per tablet Take 1 tablet by mouth every 8 (eight) hours as needed for moderate pain. Norval Morton, MD Reordered  Orderedas:HYDROcodone-acetaminophen (NORCO) 10-325 MG per tablet 1 tablet - 1 tablet, Oral, Every 8 hours PRN, moderate pain, Starting Wed 10/03/16 at 0246  calcium carbonate (OS-CAL) 600 MG TABS tablet Take 600 mg by mouth 2 (two) times daily with a meal. Norval Morton, MD Not Ordered  cholecalciferol (VITAMIN D) 1000 units tablet Take 1,000 Units by mouth daily. Norval Morton, MD Not Ordered  diphenhydramine-acetaminophen (TYLENOL PM) 25-500 MG TABS tablet Take 1 tablet by mouth at bedtime as needed (sleep/pain). Norval Morton, MD Not Ordered  Orderedas:diphenhydramine-acetaminophen (TYLENOL PM) 25-500 MG per tablet 1 tablet - 1 tablet, Oral, At bedtime PRN, sleep/pain, Starting Wed 10/03/16 at 0246 (Discontinued)  ketorolac (TORADOL) 10 MG tablet Take 1 tablet (10 mg total) by mouth every 6 (six) hours as needed. Norval Morton, MD Not Ordered   Patient not taking: Reported on 10/02/2016    omega-3 acid ethyl esters (LOVAZA) 1 G capsule Take by mouth 2 (two) times daily. Norval Morton, MD Not Ordered     ROS:  History obtained from patient. As per HPI.   Blood pressure 98/68, pulse 78, temperature 98.4 F (36.9 C), temperature source Oral, resp. rate 22, height 5\' 6"  (1.676 m), weight 63.5 kg (140 lb), SpO2 95 %.  General Examination:                                                                                                       HEENT-  Normocephalic/atraumatic.  Lungs- No gross wheezing. Respirations unlabored.  Extremities- Warm and well perfused.   Neurological Examination Mental Status: Alert, oriented, thought content appropriate.  Speech fluent without evidence of aphasia.  Able to follow all commands without difficulty. Cranial Nerves: II:  Visual fields intact to bedside confrontation, PERRL III,IV, VI: ptosis not present, EOMI with saccadic visual pursuits noted. No nystagmus. V,VII: smile symmetric, facial temperature sensation normal bilaterally VIII: hearing intact to conversation IX,X: No hypophonia XI: Symmetric shoulder shrug XII: midline tongue extension Motor: RUE: 4/5 proximal and distal with decreased tone.  LUE: 4/5 proximal and distal with decreased tone.  RLE: 4/5 proximal, 4-/5 distal, decreased tone LLE: 4/5 proximal, 4-/5 distal, decreased tone Sensory: Temperature sensation intact in upper extremities, mildly decreased in bilateral lower extremities. Dysesthesia ("feels like sandpaper") to fine touch bilateral lower extremities and abdomen/thorax to T6 level. Fine touch normal bilateral upper extremities.  Deep Tendon Reflexes: 3+ brachioradialis, biceps, patellae and achilles bilaterally.  Plantars: Upgoing bilaterally, right more briskly than left.  Cerebellar: Pronounced dyssinergia with FNF and other movements of bilateral upper extremities.  Gait: Unable to assess  Lab Results: Basic Metabolic Panel:  Recent Labs Lab 10/02/16 2153  NA 136  K 3.8  CL 98*  CO2 25  GLUCOSE 126*  BUN 12  CREATININE 0.86  CALCIUM 8.5*    Liver Function Tests:  Recent Labs Lab 10/02/16 2153  AST 19  ALT 22  ALKPHOS 69  BILITOT 0.4  PROT 5.2*  ALBUMIN 2.7*   No results for input(s): LIPASE, AMYLASE in the last 168 hours. No results for input(s): AMMONIA in the last 168 hours.  CBC:  Recent Labs Lab 10/02/16 2153  WBC 8.2  NEUTROABS 4.7  HGB 12.9  HCT 39.7  MCV 96.8   PLT 127*    Cardiac Enzymes: No results for input(s): CKTOTAL, CKMB, CKMBINDEX, TROPONINI in the last 168 hours.  Lipid Panel: No results for input(s): CHOL, TRIG, HDL, CHOLHDL, VLDL, LDLCALC in the last 168 hours.  CBG: No results for input(s): GLUCAP in the last 168 hours.  Microbiology: Results for orders placed or performed during the hospital encounter of 09/20/16  CSF culture     Status: None   Collection Time: 09/22/16  5:57 PM  Result Value Ref Range Status   Specimen Description CSF  Final   Special Requests NONE  Final   Gram Stain   Final    WBC PRESENT, PREDOMINANTLY MONONUCLEAR NO ORGANISMS SEEN CYTOSPIN SMEAR    Culture NO GROWTH 3 DAYS  Final   Report Status 09/26/2016 FINAL  Final    Coagulation Studies: No results for input(s): LABPROT,  INR in the last 72 hours.  Imaging: No results found.  Assessment:  71 y.o. female with recent diagnosis of HSV2 encephalomyelitis, on IV acyclovir at home, who presents with worsening leg pain, sensory loss and gait instability in addition to worsening upper extremity weakness and incoordination.  1. History of symptoms and examination findings are most consistent with worsening cervical myelopathy, now with additional findings suggestive of cerebellar involvement. Most likely secondary to a rare instance of HSV2 encephalomyelitis. HSV2 testing on CSF was positive last admission. VZV and HSV1 PCR were negative.  2. Has had 4 prior positives when tested for lupus, per patient, but ANA last admission was negative. Also negative were ACE, NMO titer, RA latex test, C-ANCA/P-ANCA, SSA/SSB, ribonucleic protein Ab and ENA SM Ab. HIV testing was negative. CSF VDRL was negative. CSF WBC were markedly elevated at 490 and total protein was mildly elevated at 53. Atypical cells on CSF also noted.  3. Imaging studies from last admission were reviewed. Findings consistent with encephalitis were seen in the paramedian frontal and cingulate  gyrus regions. Extensive lesional signal abnormality was seen in the cervical spinal cord. Thoracic spinal cord lesional signal abnormality also noted.   Recommendations: 1. Repeat MRI brain, cervical and thoracic spine with and without contrast.  2. ID consult regarding possible acyclovir-resistance of disseminated HSV2 CNS infection. Per article found from Pubmed search on acyclovir-resistant HSV: "therapy with intravenous foscarnet should be given for 10 days or until complete resolution of the lesions. The dosage of foscarnet should be 40 milligrams per kilogram three times per day or 60 milligrams per kilogram twice daily. If foscarnet fails to achieve clinical clearing, consideration should be given to use of intravenous cidofovir (or application of compounded 1% to 3% topical cidofovir ointment). Vidarabine is reserved for situations in which all of these therapies fail". Continue IV acyclovir for now.  3. Start 5 day course of IV methylprednisolone, 1000 mg qd. 4. PT/OT.  5. Bladder scan.  Electronically signed: Dr. Kerney Elbe 10/03/2016, 1:41 AM

## 2016-10-03 NOTE — Consult Note (Signed)
[facility] for Infectious Disease       Reason for Consult: HSV-2 transverse myelitis    Referring Physician: Dr. 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 enoxaparin (LOVENOX) injection  40 mg Subcutaneous Q24H  . gabapentin  100 mg Oral TID  . methylPREDNISolone (SOLU-MEDROL) injection  1,000 mg Intravenous Q24H  . pantoprazole  40 mg Oral Daily    Recommendations: Continue acyclovir Steroids per neurology   Assessment: She has HSV-2 transverse myelitis diagnosed in a hospitalization earlier this month with HSV-2 DNA in her CSF with elevated WBCs, MRI findings of cervical and thoracic myelitis.  She now has increased pain, difficulty with her arms and hands and came in for evaluation with concern by Dr. Jannifer Franklin of acyclovir resistance.  Her repeat MRI though is improved and therefore I do not feel she needs a change in her antivirals.    Antibiotics: Acyclovir day 13/21  HPI: Katelyn Lamb is a 71 y.o. female with a remote history of HSV2 infection who was admitted on 1/18 with worsening back pain, weakness, numbness and urinary changes and diagnosed as above with transverse myelitis due to HSV2.  She was  Sent out on acyclovir and had completed steroid treatment but has noted worsening pain now and difficulty using her hands, unable to grasp the walker very well.  No fever, taking mediacations.  MRI improved.   MRI independently reviewed  Review of Systems:  Constitutional: negative for fevers and chills Gastrointestinal: negative for diarrhea Hematologic/lymphatic: negative for lymphadenopathy Musculoskeletal: positive for hip pain in left All other systems reviewed and are negative    Past Medical History:  Diagnosis Date  . Anxiety   . Back pain   . 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 Smoking status: Former Research scientist (life sciences)  . Smokeless  tobacco: Never Used  . Alcohol use No    Family History  Problem Relation Age of Onset  . Hypertension Mother     Allergies  Allergen Reactions  . Demerol [Meperidine] Other (See Comments)    Hallucinations  . Amoxicillin-Pot Clavulanate Other (See Comments)  . Penicillins Other (See Comments)    Physical Exam: Constitutional: in no apparent distress  Vitals:   10/03/16 0230 10/03/16 0836  BP: 102/75 100/60  Pulse: 84 79  Resp: 19   Temp:  98.9 F (37.2 C)   EYES: anicteric ENMT: no thrush Cardiovascular: Cor RRR Respiratory: CTA B; normal respiratory effort GI: Bowel sounds are normal, liver is not enlarged, spleen is not enlarged Musculoskeletal: no pedal edema noted Skin: negatives: no rash Neuro: numbness in bilateral feet, lower legs, able to grasp with hands but difficult for her, chorea movements of arms, some contraction of hands at rest  Lab Results  Component Value Date   WBC 6.3 10/03/2016   HGB 13.3 10/03/2016   HCT 40.2 10/03/2016   MCV 94.4 10/03/2016   PLT 135 (L) 10/03/2016    Lab Results  Component Value Date   CREATININE 0.86 10/02/2016   BUN 12 10/02/2016   NA 136 10/02/2016   K 3.8 10/02/2016   CL 98 (L) 10/02/2016   CO2 25 10/02/2016    Lab Results  Component Value Date   ALT 22 10/02/2016   AST 19 10/02/2016   ALKPHOS 69 10/02/2016     Microbiology: No results  found for this or any previous visit (from the past 240 hour(s)).  Scharlene Gloss, [facility] Infectious Disease [place] [email or web address] [phone] pager  [phone] cell 10/03/2016, 11:47 AM

## 2016-10-03 NOTE — Care Management Note (Signed)
Case Management Note  Patient Details  Name: Katelyn Lamb MRN: [id number] Date of Birth: 03-07-1946  Subjective/Objective:                  From home with spouse.  /70 y.o. female with recent diagnosis of HSV2 encephalomyelitis, on IV acyclovir at home, who presents with worsening leg pain, sensory loss and gait instability in addition to worsening upper extremity weakness and incoordination.   Action/Plan: Admit status INPATIENT (Myelitis 2/2 HSV); anticipate discharge RESUMPTION OF CARE ([place]).   Expected Discharge Date:   (unsure)               Expected Discharge Plan:  [place]  In-House Referral:  NA  Discharge planning Services  CM Consult  Post Acute Care Choice:  Resumption of Svcs/PTA Provider Choice offered to:     DME Arranged:    DME Agency:     HH Arranged:    [place] Agency:  [place]  Status of Service:  In process, will continue to follow  If discussed at Long Length of Stay Meetings, dates discussed:    Additional Comments:  Fuller Mandril, RN 10/03/2016, 9:42 AM

## 2016-10-04 ENCOUNTER — Ambulatory Visit: Payer: PPO | Admitting: Neurology

## 2016-10-04 DIAGNOSIS — R278 Other lack of coordination: Secondary | ICD-10-CM

## 2016-10-04 DIAGNOSIS — G0490 Encephalitis and encephalomyelitis, unspecified: Secondary | ICD-10-CM

## 2016-10-04 NOTE — Progress Notes (Signed)
Subjective: Patient still suffering from significant dysmetria  in her upper extremities in particular   Antibiotics:  Anti-infectives    Start     Dose/Rate Route Frequency Ordered Stop   10/03/16 0300  acyclovir (ZOVIRAX) 650 mg in dextrose 5 % 100 mL IVPB    Comments:  TAKE FOR 16 MORE DAYS.     10 mg/kg  65 kg 113 mL/hr over 60 Minutes Intravenous Every 8 hours 10/03/16 0251 10/13/16 0559      Medications: Scheduled Meds: . acyclovir (ZOVIRAX) </= 700 mg IVPB  10 mg/kg Intravenous Q8H  . enoxaparin (LOVENOX) injection  40 mg Subcutaneous Q24H  . gabapentin  100 mg Oral TID  . methylPREDNISolone (SOLU-MEDROL) injection  1,000 mg Intravenous Q24H  . pantoprazole  40 mg Oral Daily   Continuous Infusions: . sodium chloride 75 mL/hr at 10/04/16 0512   PRN Meds:.acetaminophen **OR** acetaminophen, albuterol, ALPRAZolam, diazepam, HYDROcodone-acetaminophen, morphine, ondansetron **OR** ondansetron (ZOFRAN) IV    Objective: Weight change:   Intake/Output Summary (Last 24 hours) at 10/04/16 1747 Last data filed at 10/04/16 1422  Gross per 24 hour  Intake             1754 ml  Output                0 ml  Net             1754 ml   Blood pressure 112/68, pulse 90, temperature 98.6 F (37 C), temperature source Oral, resp. rate 18, height 5\' 6"  (1.676 m), weight 140 lb (63.5 kg), SpO2 100 %. Temp:  [97.9 F (36.6 C)-98.6 F (37 C)] 98.6 F (37 C) (02/01 1024) Pulse Rate:  [90-95] 90 (02/01 1415) Resp:  [16-18] 18 (02/01 1415) BP: (99-112)/(65-74) 112/68 (02/01 1415) SpO2:  [97 %-100 %] 100 % (02/01 1415)  Physical Exam: General: Alert and awake, oriented x3, not in any acute distress. HEENT: anicteric sclera, pupils reactive to light and accommodation, EOMI CVS regular rate, normal r,  no murmur rubs or gallops Chest: clear to auscultation bilaterally, no wheezing, rales or rhonchi Abdomen: soft nontender, nondistended, normal bowel sounds, Extremities: no   clubbing or edema noted bilaterally Skin: no rashes  Neuro: prominent dysmetria  CBC: CBC Latest Ref Rng & Units 10/03/2016 10/02/2016 09/25/2016  WBC 4.0 - 10.5 K/uL 6.3 8.2 12.2(H)  Hemoglobin 12.0 - 15.0 g/dL 13.3 12.9 13.0  Hematocrit 36.0 - 46.0 % 40.2 39.7 37.9  Platelets 150 - 400 K/uL 135(L) 127(L) 194      BMET  Recent Labs  10/02/16 2153  NA 136  K 3.8  CL 98*  CO2 25  GLUCOSE 126*  BUN 12  CREATININE 0.86  CALCIUM 8.5*     Liver Panel   Recent Labs  10/02/16 2153  PROT 5.2*  ALBUMIN 2.7*  AST 19  ALT 22  ALKPHOS 69  BILITOT 0.4       Sedimentation Rate No results for input(s): ESRSEDRATE in the last 72 hours. C-Reactive Protein No results for input(s): CRP in the last 72 hours.  Micro Results: Recent Results (from the past 720 hour(s))  CSF culture     Status: None   Collection Time: 09/22/16  5:57 PM  Result Value Ref Range Status   Specimen Description CSF  Final   Special Requests NONE  Final   Gram Stain   Final    WBC PRESENT, PREDOMINANTLY MONONUCLEAR NO ORGANISMS SEEN CYTOSPIN SMEAR  Culture NO GROWTH 3 DAYS  Final   Report Status 09/26/2016 FINAL  Final    Studies/Results: Mr Jeri Cos And Wo Contrast  Result Date: 10/03/2016 CLINICAL DATA:  Recent hospitalization for HSV encephalitis and myelitis. Progressive decline in functional status with worsening weakness, decreased ability to walk, involuntary hand movements, and loss of coordination. Numbness from the nipple line down. EXAM: MRI HEAD WITHOUT AND WITH CONTRAST MRI CERVICAL SPINE WITHOUT AND WITH CONTRAST MRI THORACIC SPINE WITHOUT AND WITH CONTRAST MRI LUMBAR SPINE WITHOUT AND WITH CONTRAST TECHNIQUE: Multiplanar, multiecho pulse sequences of the brain and surrounding structures, cervical spine, to include the craniocervical junction and cervicothoracic junction, and thoracic and lumbar spine were obtained without and with intravenous contrast. CONTRAST:  13 mL  MultiHance COMPARISON:  Brain MRI 09/23/2016.  Total spine MRI 09/21/2015. FINDINGS: MRI HEAD FINDINGS Brain: Cortically based signal abnormality in the paramedian frontal and cingulate gyri has overall improved. There is no residual diffusion abnormality in this region, and only minimal cortical enhancement remains, greater on the left (series 47, images 30 and 35 and series 48, image 25). Abnormal FLAIR signal in this region is similar to the prior study (series 6, images 16 and 18). No abnormal leptomeningeal enhancement is identified. There is a punctate focus of subtly increased trace diffusion signal in the inferior ventral medulla right of midline, decreased from prior. There is associated enhancement in the medulla (best seen on the cervical spine examination, series 45, image 8). No new regions of signal abnormality are identified in the brain. There is no evidence of acute infarct, intracranial hemorrhage, mass, midline shift, or extra-axial fluid collection. Patchy T2 hyperintensities in the cerebral white matter and pons are unchanged and nonspecific, moderate for age. Vascular: Major intracranial vascular flow voids are preserved. Skull and upper cervical spine: Unremarkable bone marrow signal. Sinuses/Orbits: Unchanged small right mastoid effusion. Clear paranasal sinuses. Bilateral cataract extraction. Other: None. MRI CERVICAL SPINE FINDINGS Alignment: Straightening and slight reversal of the normal cervical lordosis. No listhesis. Vertebrae: No evidence of fracture or suspicious osseous lesion. C3-C7 degenerative endplate changes. Cord: Abnormal T2 hyperintensity within the cord from C2-C7 has decreased in intensity, and cord expansion has decreased. Associated enhancement in the cord from C2-C4 has increased from the prior study and is more confluent with greatest involvement of the dorsal cord. More central cord enhancement at C6-7 has mildly decreased. Posterior Fossa, vertebral arteries,  paraspinal tissues: Small focus of enhancement in the inferior ventral medulla may have minimally increased. Disc levels: Axial images are motion degraded, particularly the MERGE sequence. Disc degeneration does not appear significantly changed from the recent prior examination, most notable at C5-6 where a broad-based posterior disc osteophyte complex with more focal left paracentral component results in moderate to severe spinal stenosis with mild-to-moderate cord flattening and severe right and moderate to severe left neural foraminal stenosis. MRI THORACIC SPINE FINDINGS Alignment:  Normal. Vertebrae: No evidence of thoracic spine fracture. 10 mm T2 hyperintense focus in the T2 vertebral body is unchanged and may represent an atypical hemangioma. Cord: Patchy T2 hyperintensity within the spinal cord at T2, T6, and T7-8 has decreased. The cord still appears slightly expanded at T7-8. There is persistent enhancement in each of these areas of T2 signal abnormality, however the extent and intensity of the enhancement have decreased. No new cord lesions are identified. Paraspinal and other soft tissues: Unremarkable. Disc levels: Unchanged right central disc protrusion at T5-6 contacting and slightly flattening the ventral spinal cord without significant  stenosis. MRI LUMBAR SPINE FINDINGS Segmentation:  Standard. Alignment: Facet mediated grade 1 anterolisthesis of L4 on L5, unchanged. Vertebrae: No evidence of fracture or suspicious osseous lesion. Large L1 vertebral body hemangioma. Conus medullaris: Extends to the L1-2 level and appears normal. The cauda equina is unremarkable. No abnormal intradural enhancement or nerve root thickening. Paraspinal and other soft tissues: Prominent bladder distention though less than on the prior MRI. Disc levels: Unchanged lumbar disc and facet degeneration compared to the recent prior examination, most notable at L4-5 where there is anterolisthesis with bulging uncovered disc  and advanced facet arthrosis resulting in mild spinal stenosis, moderate bilateral lateral recess stenosis, and moderate bilateral neural foraminal stenosis. IMPRESSION: 1. Improved appearance of the paramedian frontal and cingulate gyrus signal abnormality, with resolved restricted diffusion and decreased enhancement. 2. Punctate signal abnormality in the medulla with interval decreased diffusion abnormality and stable to slightly increased enhancement. 3. No new intracranial abnormality. 4. Decreased patchy T2 signal abnormality and expansion throughout the cervical and thoracic spinal cord. Cord enhancement from C2-C4 has increased, while cord enhancement elsewhere has improved. 5. No new cord lesions. Electronically Signed   By: Logan Bores M.D.   On: 10/03/2016 07:40   Mr Cervical Spine W Or Wo Contrast  Result Date: 10/03/2016 CLINICAL DATA:  Recent hospitalization for HSV encephalitis and myelitis. Progressive decline in functional status with worsening weakness, decreased ability to walk, involuntary hand movements, and loss of coordination. Numbness from the nipple line down. EXAM: MRI HEAD WITHOUT AND WITH CONTRAST MRI CERVICAL SPINE WITHOUT AND WITH CONTRAST MRI THORACIC SPINE WITHOUT AND WITH CONTRAST MRI LUMBAR SPINE WITHOUT AND WITH CONTRAST TECHNIQUE: Multiplanar, multiecho pulse sequences of the brain and surrounding structures, cervical spine, to include the craniocervical junction and cervicothoracic junction, and thoracic and lumbar spine were obtained without and with intravenous contrast. CONTRAST:  13 mL MultiHance COMPARISON:  Brain MRI 09/23/2016.  Total spine MRI 09/21/2015. FINDINGS: MRI HEAD FINDINGS Brain: Cortically based signal abnormality in the paramedian frontal and cingulate gyri has overall improved. There is no residual diffusion abnormality in this region, and only minimal cortical enhancement remains, greater on the left (series 47, images 30 and 35 and series 48, image  25). Abnormal FLAIR signal in this region is similar to the prior study (series 6, images 16 and 18). No abnormal leptomeningeal enhancement is identified. There is a punctate focus of subtly increased trace diffusion signal in the inferior ventral medulla right of midline, decreased from prior. There is associated enhancement in the medulla (best seen on the cervical spine examination, series 45, image 8). No new regions of signal abnormality are identified in the brain. There is no evidence of acute infarct, intracranial hemorrhage, mass, midline shift, or extra-axial fluid collection. Patchy T2 hyperintensities in the cerebral white matter and pons are unchanged and nonspecific, moderate for age. Vascular: Major intracranial vascular flow voids are preserved. Skull and upper cervical spine: Unremarkable bone marrow signal. Sinuses/Orbits: Unchanged small right mastoid effusion. Clear paranasal sinuses. Bilateral cataract extraction. Other: None. MRI CERVICAL SPINE FINDINGS Alignment: Straightening and slight reversal of the normal cervical lordosis. No listhesis. Vertebrae: No evidence of fracture or suspicious osseous lesion. C3-C7 degenerative endplate changes. Cord: Abnormal T2 hyperintensity within the cord from C2-C7 has decreased in intensity, and cord expansion has decreased. Associated enhancement in the cord from C2-C4 has increased from the prior study and is more confluent with greatest involvement of the dorsal cord. More central cord enhancement at C6-7 has mildly decreased.  Posterior Fossa, vertebral arteries, paraspinal tissues: Small focus of enhancement in the inferior ventral medulla may have minimally increased. Disc levels: Axial images are motion degraded, particularly the MERGE sequence. Disc degeneration does not appear significantly changed from the recent prior examination, most notable at C5-6 where a broad-based posterior disc osteophyte complex with more focal left paracentral  component results in moderate to severe spinal stenosis with mild-to-moderate cord flattening and severe right and moderate to severe left neural foraminal stenosis. MRI THORACIC SPINE FINDINGS Alignment:  Normal. Vertebrae: No evidence of thoracic spine fracture. 10 mm T2 hyperintense focus in the T2 vertebral body is unchanged and may represent an atypical hemangioma. Cord: Patchy T2 hyperintensity within the spinal cord at T2, T6, and T7-8 has decreased. The cord still appears slightly expanded at T7-8. There is persistent enhancement in each of these areas of T2 signal abnormality, however the extent and intensity of the enhancement have decreased. No new cord lesions are identified. Paraspinal and other soft tissues: Unremarkable. Disc levels: Unchanged right central disc protrusion at T5-6 contacting and slightly flattening the ventral spinal cord without significant stenosis. MRI LUMBAR SPINE FINDINGS Segmentation:  Standard. Alignment: Facet mediated grade 1 anterolisthesis of L4 on L5, unchanged. Vertebrae: No evidence of fracture or suspicious osseous lesion. Large L1 vertebral body hemangioma. Conus medullaris: Extends to the L1-2 level and appears normal. The cauda equina is unremarkable. No abnormal intradural enhancement or nerve root thickening. Paraspinal and other soft tissues: Prominent bladder distention though less than on the prior MRI. Disc levels: Unchanged lumbar disc and facet degeneration compared to the recent prior examination, most notable at L4-5 where there is anterolisthesis with bulging uncovered disc and advanced facet arthrosis resulting in mild spinal stenosis, moderate bilateral lateral recess stenosis, and moderate bilateral neural foraminal stenosis. IMPRESSION: 1. Improved appearance of the paramedian frontal and cingulate gyrus signal abnormality, with resolved restricted diffusion and decreased enhancement. 2. Punctate signal abnormality in the medulla with interval  decreased diffusion abnormality and stable to slightly increased enhancement. 3. No new intracranial abnormality. 4. Decreased patchy T2 signal abnormality and expansion throughout the cervical and thoracic spinal cord. Cord enhancement from C2-C4 has increased, while cord enhancement elsewhere has improved. 5. No new cord lesions. Electronically Signed   By: Logan Bores M.D.   On: 10/03/2016 07:40   Mr Thoracic Spine W Wo Contrast  Result Date: 10/03/2016 CLINICAL DATA:  Recent hospitalization for HSV encephalitis and myelitis. Progressive decline in functional status with worsening weakness, decreased ability to walk, involuntary hand movements, and loss of coordination. Numbness from the nipple line down. EXAM: MRI HEAD WITHOUT AND WITH CONTRAST MRI CERVICAL SPINE WITHOUT AND WITH CONTRAST MRI THORACIC SPINE WITHOUT AND WITH CONTRAST MRI LUMBAR SPINE WITHOUT AND WITH CONTRAST TECHNIQUE: Multiplanar, multiecho pulse sequences of the brain and surrounding structures, cervical spine, to include the craniocervical junction and cervicothoracic junction, and thoracic and lumbar spine were obtained without and with intravenous contrast. CONTRAST:  13 mL MultiHance COMPARISON:  Brain MRI 09/23/2016.  Total spine MRI 09/21/2015. FINDINGS: MRI HEAD FINDINGS Brain: Cortically based signal abnormality in the paramedian frontal and cingulate gyri has overall improved. There is no residual diffusion abnormality in this region, and only minimal cortical enhancement remains, greater on the left (series 47, images 30 and 35 and series 48, image 25). Abnormal FLAIR signal in this region is similar to the prior study (series 6, images 16 and 18). No abnormal leptomeningeal enhancement is identified. There is a punctate focus of  subtly increased trace diffusion signal in the inferior ventral medulla right of midline, decreased from prior. There is associated enhancement in the medulla (best seen on the cervical spine  examination, series 45, image 8). No new regions of signal abnormality are identified in the brain. There is no evidence of acute infarct, intracranial hemorrhage, mass, midline shift, or extra-axial fluid collection. Patchy T2 hyperintensities in the cerebral white matter and pons are unchanged and nonspecific, moderate for age. Vascular: Major intracranial vascular flow voids are preserved. Skull and upper cervical spine: Unremarkable bone marrow signal. Sinuses/Orbits: Unchanged small right mastoid effusion. Clear paranasal sinuses. Bilateral cataract extraction. Other: None. MRI CERVICAL SPINE FINDINGS Alignment: Straightening and slight reversal of the normal cervical lordosis. No listhesis. Vertebrae: No evidence of fracture or suspicious osseous lesion. C3-C7 degenerative endplate changes. Cord: Abnormal T2 hyperintensity within the cord from C2-C7 has decreased in intensity, and cord expansion has decreased. Associated enhancement in the cord from C2-C4 has increased from the prior study and is more confluent with greatest involvement of the dorsal cord. More central cord enhancement at C6-7 has mildly decreased. Posterior Fossa, vertebral arteries, paraspinal tissues: Small focus of enhancement in the inferior ventral medulla may have minimally increased. Disc levels: Axial images are motion degraded, particularly the MERGE sequence. Disc degeneration does not appear significantly changed from the recent prior examination, most notable at C5-6 where a broad-based posterior disc osteophyte complex with more focal left paracentral component results in moderate to severe spinal stenosis with mild-to-moderate cord flattening and severe right and moderate to severe left neural foraminal stenosis. MRI THORACIC SPINE FINDINGS Alignment:  Normal. Vertebrae: No evidence of thoracic spine fracture. 10 mm T2 hyperintense focus in the T2 vertebral body is unchanged and may represent an atypical hemangioma. Cord: Patchy  T2 hyperintensity within the spinal cord at T2, T6, and T7-8 has decreased. The cord still appears slightly expanded at T7-8. There is persistent enhancement in each of these areas of T2 signal abnormality, however the extent and intensity of the enhancement have decreased. No new cord lesions are identified. Paraspinal and other soft tissues: Unremarkable. Disc levels: Unchanged right central disc protrusion at T5-6 contacting and slightly flattening the ventral spinal cord without significant stenosis. MRI LUMBAR SPINE FINDINGS Segmentation:  Standard. Alignment: Facet mediated grade 1 anterolisthesis of L4 on L5, unchanged. Vertebrae: No evidence of fracture or suspicious osseous lesion. Large L1 vertebral body hemangioma. Conus medullaris: Extends to the L1-2 level and appears normal. The cauda equina is unremarkable. No abnormal intradural enhancement or nerve root thickening. Paraspinal and other soft tissues: Prominent bladder distention though less than on the prior MRI. Disc levels: Unchanged lumbar disc and facet degeneration compared to the recent prior examination, most notable at L4-5 where there is anterolisthesis with bulging uncovered disc and advanced facet arthrosis resulting in mild spinal stenosis, moderate bilateral lateral recess stenosis, and moderate bilateral neural foraminal stenosis. IMPRESSION: 1. Improved appearance of the paramedian frontal and cingulate gyrus signal abnormality, with resolved restricted diffusion and decreased enhancement. 2. Punctate signal abnormality in the medulla with interval decreased diffusion abnormality and stable to slightly increased enhancement. 3. No new intracranial abnormality. 4. Decreased patchy T2 signal abnormality and expansion throughout the cervical and thoracic spinal cord. Cord enhancement from C2-C4 has increased, while cord enhancement elsewhere has improved. 5. No new cord lesions. Electronically Signed   By: Logan Bores M.D.   On:  10/03/2016 07:40   Mr Lumbar Spine W Wo Contrast (assess For Abscess, Cord  Compression)  Result Date: 10/03/2016 CLINICAL DATA:  Recent hospitalization for HSV encephalitis and myelitis. Progressive decline in functional status with worsening weakness, decreased ability to walk, involuntary hand movements, and loss of coordination. Numbness from the nipple line down. EXAM: MRI HEAD WITHOUT AND WITH CONTRAST MRI CERVICAL SPINE WITHOUT AND WITH CONTRAST MRI THORACIC SPINE WITHOUT AND WITH CONTRAST MRI LUMBAR SPINE WITHOUT AND WITH CONTRAST TECHNIQUE: Multiplanar, multiecho pulse sequences of the brain and surrounding structures, cervical spine, to include the craniocervical junction and cervicothoracic junction, and thoracic and lumbar spine were obtained without and with intravenous contrast. CONTRAST:  13 mL MultiHance COMPARISON:  Brain MRI 09/23/2016.  Total spine MRI 09/21/2015. FINDINGS: MRI HEAD FINDINGS Brain: Cortically based signal abnormality in the paramedian frontal and cingulate gyri has overall improved. There is no residual diffusion abnormality in this region, and only minimal cortical enhancement remains, greater on the left (series 47, images 30 and 35 and series 48, image 25). Abnormal FLAIR signal in this region is similar to the prior study (series 6, images 16 and 18). No abnormal leptomeningeal enhancement is identified. There is a punctate focus of subtly increased trace diffusion signal in the inferior ventral medulla right of midline, decreased from prior. There is associated enhancement in the medulla (best seen on the cervical spine examination, series 45, image 8). No new regions of signal abnormality are identified in the brain. There is no evidence of acute infarct, intracranial hemorrhage, mass, midline shift, or extra-axial fluid collection. Patchy T2 hyperintensities in the cerebral white matter and pons are unchanged and nonspecific, moderate for age. Vascular: Major  intracranial vascular flow voids are preserved. Skull and upper cervical spine: Unremarkable bone marrow signal. Sinuses/Orbits: Unchanged small right mastoid effusion. Clear paranasal sinuses. Bilateral cataract extraction. Other: None. MRI CERVICAL SPINE FINDINGS Alignment: Straightening and slight reversal of the normal cervical lordosis. No listhesis. Vertebrae: No evidence of fracture or suspicious osseous lesion. C3-C7 degenerative endplate changes. Cord: Abnormal T2 hyperintensity within the cord from C2-C7 has decreased in intensity, and cord expansion has decreased. Associated enhancement in the cord from C2-C4 has increased from the prior study and is more confluent with greatest involvement of the dorsal cord. More central cord enhancement at C6-7 has mildly decreased. Posterior Fossa, vertebral arteries, paraspinal tissues: Small focus of enhancement in the inferior ventral medulla may have minimally increased. Disc levels: Axial images are motion degraded, particularly the MERGE sequence. Disc degeneration does not appear significantly changed from the recent prior examination, most notable at C5-6 where a broad-based posterior disc osteophyte complex with more focal left paracentral component results in moderate to severe spinal stenosis with mild-to-moderate cord flattening and severe right and moderate to severe left neural foraminal stenosis. MRI THORACIC SPINE FINDINGS Alignment:  Normal. Vertebrae: No evidence of thoracic spine fracture. 10 mm T2 hyperintense focus in the T2 vertebral body is unchanged and may represent an atypical hemangioma. Cord: Patchy T2 hyperintensity within the spinal cord at T2, T6, and T7-8 has decreased. The cord still appears slightly expanded at T7-8. There is persistent enhancement in each of these areas of T2 signal abnormality, however the extent and intensity of the enhancement have decreased. No new cord lesions are identified. Paraspinal and other soft tissues:  Unremarkable. Disc levels: Unchanged right central disc protrusion at T5-6 contacting and slightly flattening the ventral spinal cord without significant stenosis. MRI LUMBAR SPINE FINDINGS Segmentation:  Standard. Alignment: Facet mediated grade 1 anterolisthesis of L4 on L5, unchanged. Vertebrae: No evidence of fracture  or suspicious osseous lesion. Large L1 vertebral body hemangioma. Conus medullaris: Extends to the L1-2 level and appears normal. The cauda equina is unremarkable. No abnormal intradural enhancement or nerve root thickening. Paraspinal and other soft tissues: Prominent bladder distention though less than on the prior MRI. Disc levels: Unchanged lumbar disc and facet degeneration compared to the recent prior examination, most notable at L4-5 where there is anterolisthesis with bulging uncovered disc and advanced facet arthrosis resulting in mild spinal stenosis, moderate bilateral lateral recess stenosis, and moderate bilateral neural foraminal stenosis. IMPRESSION: 1. Improved appearance of the paramedian frontal and cingulate gyrus signal abnormality, with resolved restricted diffusion and decreased enhancement. 2. Punctate signal abnormality in the medulla with interval decreased diffusion abnormality and stable to slightly increased enhancement. 3. No new intracranial abnormality. 4. Decreased patchy T2 signal abnormality and expansion throughout the cervical and thoracic spinal cord. Cord enhancement from C2-C4 has increased, while cord enhancement elsewhere has improved. 5. No new cord lesions. Electronically Signed   By: Logan Bores M.D.   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 There was concern for potential acyclovir though we do not think this is likely at all.  #1 Cervical and thoracic myelitis due to HSV:  Given the fact that the patient has never been exposed to acyclovir until her recent admission with cervical and thoracic myelitis I think the possibility of her having acquired acyclovir resistance is exceedingly low if not impossible  I suspect the bulk of her pathology currently is being driven by the inflammatory response itself and I do support corticosteroid therapy.  I would finish out a 21 day course of IV acyclovir as planned  I discussed the case with Kathrynn Speed from neurology  I will sign off please call with further questions.     LOS: 1 day   Alcide Evener 10/04/2016, 5:47 PM

## 2016-10-04 NOTE — Progress Notes (Signed)
T/O Dr. Wendee Beavers.  OK to dc telemetry.

## 2016-10-04 NOTE — Evaluation (Signed)
Occupational Therapy Evaluation Patient Details Name: Katelyn Lamb MRN: [id number] DOB: 1946/02/10 Today's Date: 10/04/2016    History of Present Illness 71 y.o.femalewith medical history significant of anxiety andchronic back pain; presents with a five-day history of progressively worsening weakness. Just recently hospitalized here at [facility] 1/18-1/24for HSV encephalitis and myelitis. Husband notes a gradual decline the patient's overall functional status including decreased ability to walk, involuntary hand movements,and loss of coordination. Patient reports associated symptoms of numbness from the nipple line down, increased work of breathing,and feeling as though a band is wrapped around waist.   Clinical Impression   Pt reports recently she has required assist with all ADL. Currently pt requires mod assist +2 for safety with functional mobility and min-mod assist for ADL. Pt presenting with generalized weakness, dysmetria, bil UE athetoid movements, poor sitting and standing balance, and impaired sensation impacting her independence and safety with ADL and functional mobility. Pt very motivated to return to functional independence, willing to participate in therapy, and with supportive family present in room. Feel pt would be an excellent CIR candidate to maximize independence and safety with ADL and functional mobility prior to return home. Pt would benefit from continued skilled OT to address established goals.    Follow Up Recommendations  CIR;Supervision/Assistance - 24 hour    Equipment Recommendations  Tub/shower bench    Recommendations for Other Services Rehab consult     Precautions / Restrictions Precautions Precautions: Fall Restrictions Weight Bearing Restrictions: No      Mobility Bed Mobility Overal bed mobility: Needs Assistance Bed Mobility: Supine to Sit;Sit to Supine     Supine to sit: Mod assist Sit to supine: Min guard   General bed mobility  comments: Assist for trunk elevation from supine to sit and scooting hips out toward EOB. Cues for sequencing and hand placement.  Transfers Overall transfer level: Needs assistance Equipment used: Rolling walker (2 wheeled) Transfers: Sit to/from Stand Sit to Stand: Mod assist;+2 safety/equipment         General transfer comment: Mod assist to boost up from EOB and couch. Cues for hand placement and technique.    Balance Overall balance assessment: Needs assistance Sitting-balance support: Feet supported;Bilateral upper extremity supported Sitting balance-Leahy Scale: Fair     Standing balance support: Bilateral upper extremity supported Standing balance-Leahy Scale: Poor                              ADL Overall ADL's : Needs assistance/impaired Eating/Feeding: Set up;With adaptive utensils;Sitting Eating/Feeding Details (indicate cue type and reason): Pt with orange foam in room to use as built up handle for utensils. Pt reports she has just been using foam for hand strengthening. Educated pt and husband on use of foam for built up handle; pt able to demo use with spoon to mouth. Grooming: Minimal assistance;Sitting   Upper Body Bathing: Minimal assistance;Sitting   Lower Body Bathing: Moderate assistance;Sit to/from stand   Upper Body Dressing : Minimal assistance;Sitting Upper Body Dressing Details (indicate cue type and reason): to don/doff hospital gown Lower Body Dressing: Moderate assistance;Sit to/from stand Lower Body Dressing Details (indicate cue type and reason): Pt able to adjust socks in sitting. Anticipate she would have difficulty getting items started over her feet and pulling up clothing in standing. Toilet Transfer: Moderate assistance;+2 for safety/equipment;Ambulation;BSC;RW;Cueing for safety;Cueing for sequencing Toilet Transfer Details (indicate cue type and reason): Simulated by sit to stand from EOB with  functional mobility in room.          Functional mobility during ADLs: Moderate assistance;+2 for safety/equipment;Rolling walker;Cueing for safety;Cueing for sequencing General ADL Comments: Discussed need for post acute rehab; pt and husband are agreeable and hopeful for CIR placement.     Vision     Perception     Praxis      Pertinent Vitals/Pain Pain Assessment: Faces Faces Pain Scale: Hurts even more Pain Location: nerve pain bil UE and LEs Pain Descriptors / Indicators: Tingling;Numbness;Burning Pain Intervention(s): Limited activity within patient's tolerance;Monitored during session     Hand Dominance Right   Extremity/Trunk Assessment Upper Extremity Assessment Upper Extremity Assessment: RUE deficits/detail;LUE deficits/detail RUE Deficits / Details: Strength grossly 4/5. Pt reports impaired sensation. Poor grip strength. Pt with poor coordaintion; dysmetria and athetoid movements. RUE Sensation: decreased light touch;decreased proprioception RUE Coordination: decreased fine motor;decreased gross motor LUE Deficits / Details: Strength grossly 4/5. Pt reports impaired sensation. Poor grip strength. Pt with poor coordaintion; dysmetria and athetoid movements. Pt reports L worse than R side. LUE Sensation: decreased light touch;decreased proprioception LUE Coordination: decreased fine motor;decreased gross motor   Lower Extremity Assessment Lower Extremity Assessment: Defer to PT evaluation   Cervical / Trunk Assessment Cervical / Trunk Assessment: Kyphotic   Communication Communication Communication: No difficulties   Cognition Arousal/Alertness: Awake/alert Behavior During Therapy: Anxious;WFL for tasks assessed/performed Overall Cognitive Status: Within Functional Limits for tasks assessed                     General Comments       Exercises       Shoulder Instructions      Home Living Family/patient expects to be discharged to:: Private residence Living Arrangements:  Spouse/significant other Available Help at Discharge: Family;Available 24 hours/day Type of Home: House Home Access: Stairs to enter [employer] of Steps: 2 Entrance Stairs-Rails: None Home Layout: Two level;Able to live on main level with bedroom/bathroom     Bathroom Shower/Tub: Tub/shower unit;Curtain   Bathroom Toilet: Standard Bathroom Accessibility: Yes (can go in sideways)   Home Equipment: Bedside commode;Walker - 2 wheels;Adaptive equipment (gait belt) Adaptive Equipment: Reacher        Prior Functioning/Environment Level of Independence: Needs assistance  Gait / Transfers Assistance Needed: min assist for ambulation with RW.  min asssit to stand from lower chair.  (Sponge bathed; ) ADL's / Homemaking Assistance Needed: Sponge bathed PTA per pt due to PICC -someone helped with all of it; Assist with dressing; A to feed pt as well.     Comments: [place] in the bathroom a few times per pt        OT Problem List: Decreased strength;Decreased activity tolerance;Impaired balance (sitting and/or standing);Decreased coordination;Decreased knowledge of use of DME or AE;Decreased knowledge of precautions;Impaired sensation;Impaired tone;Impaired UE functional use;Pain   OT Treatment/Interventions: Self-care/ADL training;Therapeutic exercise;Neuromuscular education;Energy conservation;DME and/or AE instruction;Therapeutic activities;Patient/family education;Balance training    OT Goals(Current goals can be found in the care plan section) Acute Rehab OT Goals Patient Stated Goal: return to being independent OT Goal Formulation: With patient/family Time For Goal Achievement: 10/18/16 Potential to Achieve Goals: Good ADL Goals Pt Will Perform Eating: with modified independence;with adaptive utensils;sitting Pt Will Perform Grooming: with min guard assist;standing Pt Will Perform Upper Body Bathing: with supervision;sitting Pt Will Perform Lower Body Bathing: with min  guard assist;sit to/from stand Pt Will Perform Upper Body Dressing: with supervision;sitting Pt Will Perform Lower Body Dressing: with min guard assist;sit  to/from stand Pt Will Transfer to Toilet: ambulating;bedside commode;with min guard assist Pt Will Perform Toileting - Clothing Manipulation and hygiene: with min guard assist;sit to/from stand  OT Frequency: Min 2X/week   Barriers to D/C:            Co-evaluation PT/OT/SLP Co-Evaluation/Treatment: Yes Reason for Co-Treatment: Complexity of the patient's impairments (multi-system involvement);For patient/therapist safety;To address functional/ADL transfers   OT goals addressed during session: ADL's and self-care      End of Session Equipment Utilized During Treatment: Gait belt;Rolling walker Nurse Communication: Mobility status  Activity Tolerance: Patient tolerated treatment well Patient left: in bed;with call bell/phone within reach;with family/visitor present   Time: 1125-1202 OT Time Calculation (min): 37 min Charges:  OT General Charges $OT Visit: 1 Procedure OT Evaluation $OT Eval Moderate Complexity: 1 Procedure G-Codes:     Binnie Kand M.S., OTR/L Pager: [phone]  10/04/2016, 3:28 PM

## 2016-10-04 NOTE — NC FL2 (Signed)
[place] LEVEL OF CARE SCREENING TOOL     IDENTIFICATION  Patient Name: Katelyn Lamb Birthdate: Sep 15, 1945 Sex: female Admission Date (Current Location): 10/02/2016  [facility] and [place] Number:  Herbalist and Address:  [facility], [place] [address], [place], [place] [address]      Provider Number: [id number]  Attending Physician Name and Address:  Velvet Bathe, MD  Relative Name and Phone Number:       Current Level of Care: Hospital Recommended Level of Care: [place] Prior Approval Number:    Date Approved/Denied:   PASRR Number: [id number]  Discharge Plan: SNF    Current Diagnoses: Patient Active Problem List   Diagnosis Date Noted  . Thrombocytopenia ([place]) 10/03/2016  . Anxiety 10/03/2016  . Abnormal MRI, spinal cord   . Encephalitis and encephalomyelitis   . 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 Numbness   . Intractable back pain 09/20/2016  . Numbness of left lower extremity 09/20/2016  . Hyponatremia 09/20/2016  . Herpes zoster 09/20/2016    Orientation RESPIRATION BLADDER Height & Weight     Self, Time, Situation, Place  Normal Incontinent Weight: 140 lb (63.5 kg) Height:  5\' 6"  (167.6 cm)  BEHAVIORAL SYMPTOMS/MOOD NEUROLOGICAL BOWEL NUTRITION STATUS      Continent Diet (Heart healthy, thin consistency)  AMBULATORY STATUS COMMUNICATION OF NEEDS Skin   Extensive Assist Verbally Normal                       Personal Care Assistance Level of Assistance  Bathing, Feeding, Dressing Bathing Assistance: Maximum assistance Feeding assistance: Maximum assistance Dressing Assistance: Maximum assistance     Functional Limitations Info  Sight, Hearing, Speech Sight Info: Adequate Hearing Info: Adequate Speech Info: Adequate    SPECIAL CARE FACTORS FREQUENCY  PT (By licensed PT), OT (By licensed OT)     PT Frequency: 5 OT Frequency: 5             Contractures Contractures Info: Not present    Additional Factors Info  Code Status, Allergies, Isolation Precautions Code Status Info: Full Allergies Info:  Demerol Meperidine, Amoxicillin-pot Clavulanate, Penicillins     Isolation Precautions Info: None     Current Medications (10/04/2016):  This is the current hospital active medication list Current Facility-Administered Medications  Medication Dose Route Frequency Provider Last Rate Last Dose  . 0.9 %  sodium chloride infusion   Intravenous Continuous Fredia Sorrow, MD 75 mL/hr at 10/04/16 [id number]    . acetaminophen (TYLENOL) tablet 650 mg  650 mg Oral Q6H PRN Norval Morton, MD       Or  . acetaminophen (TYLENOL) suppository 650 mg  650 mg Rectal Q6H PRN Norval Morton, MD      . acyclovir (ZOVIRAX) 650 mg in dextrose 5 % 100 mL IVPB  10 mg/kg Intravenous Q8H Rondell A Tamala Julian, MD 113 mL/hr at 10/04/16 1445 650 mg at 10/04/16 1445  . albuterol (PROVENTIL) (2.5 MG/3ML) 0.083% nebulizer solution 2.5 mg  2.5 mg Nebulization Q2H PRN Norval Morton, MD      . ALPRAZolam Duanne Moron) tablet 1 mg  1 mg Oral Daily PRN Norval Morton, MD      . diazepam (VALIUM) tablet 5 mg  5 mg Oral Q12H PRN Norval Morton, MD      . enoxaparin (LOVENOX) injection 40 mg  40 mg Subcutaneous Q24H Rondell A Smith,  MD   40 mg at 10/04/16 1446  . gabapentin (NEURONTIN) capsule 100 mg  100 mg Oral TID Norval Morton, MD   100 mg at 10/04/16 1508  . HYDROcodone-acetaminophen (NORCO) 10-325 MG per tablet 1 tablet  1 tablet Oral Q4H PRN Velvet Bathe, MD   1 tablet at 10/04/16 1508  . methylPREDNISolone sodium succinate (SOLU-MEDROL) 1,000 mg in sodium chloride 0.9 % 50 mL IVPB  1,000 mg Intravenous Q24H Norval Morton, MD   1,000 mg at 10/03/16 2047  . morphine 4 MG/ML injection 2 mg  2 mg Intravenous Q3H PRN Norval Morton, MD   2 mg at 10/03/16 1439  . ondansetron (ZOFRAN) tablet 4 mg  4 mg Oral Q6H PRN Norval Morton, MD       Or  . ondansetron (ZOFRAN)  injection 4 mg  4 mg Intravenous Q6H PRN Rondell A Tamala Julian, MD      . pantoprazole (PROTONIX) EC tablet 40 mg  40 mg Oral Daily Norval Morton, MD   40 mg at 10/04/16 [id number]     Discharge Medications: Please see discharge summary for a list of discharge medications.  Relevant Imaging Results:  Relevant Lab Results:   Additional Information SSN: [id number]  Truitt Merle, LCSW

## 2016-10-04 NOTE — Progress Notes (Signed)
Inpatient Rehabilitation  Patient was screened by Gunnar Fusi for appropriateness for an Inpatient Acute Rehab consult.  At this time we await OT/PT evaluations to determine functional needs.  Plan to follow; please call with questions.   Carmelia Roller., CCC/SLP Admission Coordinator  [place]  Cell [phone]

## 2016-10-04 NOTE — Progress Notes (Signed)
Inpatient Rehabilitation  Note OT/PT evaluations and recommendations for IP Rehab. At this time we are recommending an Inpatient Rehab consult.  MD paged; please order if you are agreeable.    Carmelia Roller., CCC/SLP Admission Coordinator  [place]  Cell [phone]

## 2016-10-04 NOTE — Progress Notes (Signed)
PROGRESS NOTE    Katelyn Lamb  [id number] DOB: 02/19/1946 DOA: 10/02/2016 PCP: [name], MD   Brief Narrative:  71 y.o. female with medical history significant of anxiety and chronic back pain; presents with a five-day history of progressively worsening weakness. 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 - Neurology on board as well as ID     Thrombocytopenia ([place]) - Stable and improved from last check    Anxiety - Stable    DVT prophylaxis: Lovenox Code Status: Full Family Communication: None at bedside Disposition Plan: Once cleared for discharge by specialist   Consultants:   ID  Neurology   Procedures: None   Antimicrobials: Acyclovir   Subjective: The patient has no new complaints. No acute issues overnight  Objective: Vitals:   10/03/16 1234 10/04/16 0517 10/04/16 1024 10/04/16 1415  BP: 104/71 111/65 99/74 112/68  Pulse: 81 95 93 90  Resp: 18 16 16 18   Temp: 97.6 F (36.4 C) 97.9 F (36.6 C) 98.6 F (37 C)   TempSrc: Oral Oral Oral   SpO2: 94% 99% 97% 100%  Weight:      Height:        Intake/Output Summary (Last 24 hours) at 10/04/16 1740 Last data filed at 10/04/16 1422  Gross per 24 hour  Intake             1994 ml  Output                0 ml  Net             1994 ml   Filed Weights   10/02/16 1943  Weight: 63.5 kg (140 lb)    Examination:  General exam: Appears calm and comfortable , In no acute distress Respiratory system: Clear to auscultation. Respiratory effort normal., No wheezes Cardiovascular system: S1 & S2 heard, RRR. No JVD, murmurs, rubs, gallops or clicks. No pedal edema. Gastrointestinal system: Abdomen is nondistended, soft and  nontender. No organomegaly or masses felt. Normal bowel sounds heard. Central nervous system: Alert and oriented. No facial asymmetry Extremities: No cyanosis Skin: No rashes, lesions or ulcers, limited exam Psychiatry: Judgement and insight appear normal. Mood & affect appropriate.   Data Reviewed: I have personally reviewed following labs and imaging studies  CBC:  Recent Labs Lab 10/02/16 2153 10/03/16 0719  WBC 8.2 6.3  NEUTROABS 4.7 5.7  HGB 12.9 13.3  HCT 39.7 40.2  MCV 96.8 94.4  PLT 127* [id number]*   Basic Metabolic Panel:  Recent Labs Lab 10/02/16 2153  NA 136  K 3.8  CL 98*  CO2 25  GLUCOSE 126*  BUN 12  CREATININE 0.86  CALCIUM 8.5*   GFR: Estimated Creatinine Clearance: 58.6 mL/min (by C-G formula based on SCr of 0.86 mg/dL). Liver Function Tests:  Recent Labs Lab 10/02/16 2153  AST 19  ALT 22  ALKPHOS 69  BILITOT 0.4  PROT 5.2*  ALBUMIN 2.7*   No results for input(s): LIPASE, AMYLASE in the last 168 hours. No results for input(s): AMMONIA in the last 168 hours. Coagulation Profile: No results for input(s): INR, PROTIME in the last 168 hours. Cardiac Enzymes: No results for input(s): CKTOTAL, CKMB, CKMBINDEX, TROPONINI in the last  168 hours. BNP (last 3 results) No results for input(s): PROBNP in the last 8760 hours. HbA1C: No results for input(s): HGBA1C in the last 72 hours. CBG: No results for input(s): GLUCAP in the last 168 hours. Lipid Profile: No results for input(s): CHOL, HDL, LDLCALC, TRIG, CHOLHDL, LDLDIRECT in the last 72 hours. Thyroid Function Tests: No results for input(s): TSH, T4TOTAL, FREET4, T3FREE, THYROIDAB in the last 72 hours. Anemia Panel: No results for input(s): VITAMINB12, FOLATE, FERRITIN, TIBC, IRON, RETICCTPCT in the last 72 hours. Sepsis Labs: No results for input(s): PROCALCITON, LATICACIDVEN in the last 168 hours.  No results found for this or any previous visit (from the past 240 hour(s)).        Radiology Studies: Mr Jeri Cos And Wo Contrast  Result Date: 10/03/2016 CLINICAL DATA:  Recent hospitalization for HSV encephalitis and myelitis. Progressive decline in functional status with worsening weakness, decreased ability to walk, involuntary hand movements, and loss of coordination. Numbness from the nipple line down. EXAM: MRI HEAD WITHOUT AND WITH CONTRAST MRI CERVICAL SPINE WITHOUT AND WITH CONTRAST MRI THORACIC SPINE WITHOUT AND WITH CONTRAST MRI LUMBAR SPINE WITHOUT AND WITH CONTRAST TECHNIQUE: Multiplanar, multiecho pulse sequences of the brain and surrounding structures, cervical spine, to include the craniocervical junction and cervicothoracic junction, and thoracic and lumbar spine were obtained without and with intravenous contrast. CONTRAST:  13 mL MultiHance COMPARISON:  Brain MRI 09/23/2016.  Total spine MRI 09/21/2015. FINDINGS: MRI HEAD FINDINGS Brain: Cortically based signal abnormality in the paramedian frontal and cingulate gyri has overall improved. There is no residual diffusion abnormality in this region, and only minimal cortical enhancement remains, greater on the left (series 47, images 30 and 35 and series 48, image 25). Abnormal FLAIR signal in this region is similar to the prior study (series 6, images 16 and 18). No abnormal leptomeningeal enhancement is identified. There is a punctate focus of subtly increased trace diffusion signal in the inferior ventral medulla right of midline, decreased from prior. There is associated enhancement in the medulla (best seen on the cervical spine examination, series 45, image 8). No new regions of signal abnormality are identified in the brain. There is no evidence of acute infarct, intracranial hemorrhage, mass, midline shift, or extra-axial fluid collection. Patchy T2 hyperintensities in the cerebral white matter and pons are unchanged and nonspecific, moderate for age. Vascular: Major intracranial vascular flow voids are  preserved. Skull and upper cervical spine: Unremarkable bone marrow signal. Sinuses/Orbits: Unchanged small right mastoid effusion. Clear paranasal sinuses. Bilateral cataract extraction. Other: None. MRI CERVICAL SPINE FINDINGS Alignment: Straightening and slight reversal of the normal cervical lordosis. No listhesis. Vertebrae: No evidence of fracture or suspicious osseous lesion. C3-C7 degenerative endplate changes. Cord: Abnormal T2 hyperintensity within the cord from C2-C7 has decreased in intensity, and cord expansion has decreased. Associated enhancement in the cord from C2-C4 has increased from the prior study and is more confluent with greatest involvement of the dorsal cord. More central cord enhancement at C6-7 has mildly decreased. Posterior Fossa, vertebral arteries, paraspinal tissues: Small focus of enhancement in the inferior ventral medulla may have minimally increased. Disc levels: Axial images are motion degraded, particularly the MERGE sequence. Disc degeneration does not appear significantly changed from the recent prior examination, most notable at C5-6 where a broad-based posterior disc osteophyte complex with more focal left paracentral component results in moderate to severe spinal stenosis with mild-to-moderate cord flattening and severe right and moderate to severe left neural foraminal stenosis. MRI THORACIC SPINE  FINDINGS Alignment:  Normal. Vertebrae: No evidence of thoracic spine fracture. 10 mm T2 hyperintense focus in the T2 vertebral body is unchanged and may represent an atypical hemangioma. Cord: Patchy T2 hyperintensity within the spinal cord at T2, T6, and T7-8 has decreased. The cord still appears slightly expanded at T7-8. There is persistent enhancement in each of these areas of T2 signal abnormality, however the extent and intensity of the enhancement have decreased. No new cord lesions are identified. Paraspinal and other soft tissues: Unremarkable. Disc levels: Unchanged  right central disc protrusion at T5-6 contacting and slightly flattening the ventral spinal cord without significant stenosis. MRI LUMBAR SPINE FINDINGS Segmentation:  Standard. Alignment: Facet mediated grade 1 anterolisthesis of L4 on L5, unchanged. Vertebrae: No evidence of fracture or suspicious osseous lesion. Large L1 vertebral body hemangioma. Conus medullaris: Extends to the L1-2 level and appears normal. The cauda equina is unremarkable. No abnormal intradural enhancement or nerve root thickening. Paraspinal and other soft tissues: Prominent bladder distention though less than on the prior MRI. Disc levels: Unchanged lumbar disc and facet degeneration compared to the recent prior examination, most notable at L4-5 where there is anterolisthesis with bulging uncovered disc and advanced facet arthrosis resulting in mild spinal stenosis, moderate bilateral lateral recess stenosis, and moderate bilateral neural foraminal stenosis. IMPRESSION: 1. Improved appearance of the paramedian frontal and cingulate gyrus signal abnormality, with resolved restricted diffusion and decreased enhancement. 2. Punctate signal abnormality in the medulla with interval decreased diffusion abnormality and stable to slightly increased enhancement. 3. No new intracranial abnormality. 4. Decreased patchy T2 signal abnormality and expansion throughout the cervical and thoracic spinal cord. Cord enhancement from C2-C4 has increased, while cord enhancement elsewhere has improved. 5. No new cord lesions. Electronically Signed   By: Logan Bores M.D.   On: 10/03/2016 07:40   Mr Cervical Spine W Or Wo Contrast  Result Date: 10/03/2016 CLINICAL DATA:  Recent hospitalization for HSV encephalitis and myelitis. Progressive decline in functional status with worsening weakness, decreased ability to walk, involuntary hand movements, and loss of coordination. Numbness from the nipple line down. EXAM: MRI HEAD WITHOUT AND WITH CONTRAST MRI  CERVICAL SPINE WITHOUT AND WITH CONTRAST MRI THORACIC SPINE WITHOUT AND WITH CONTRAST MRI LUMBAR SPINE WITHOUT AND WITH CONTRAST TECHNIQUE: Multiplanar, multiecho pulse sequences of the brain and surrounding structures, cervical spine, to include the craniocervical junction and cervicothoracic junction, and thoracic and lumbar spine were obtained without and with intravenous contrast. CONTRAST:  13 mL MultiHance COMPARISON:  Brain MRI 09/23/2016.  Total spine MRI 09/21/2015. FINDINGS: MRI HEAD FINDINGS Brain: Cortically based signal abnormality in the paramedian frontal and cingulate gyri has overall improved. There is no residual diffusion abnormality in this region, and only minimal cortical enhancement remains, greater on the left (series 47, images 30 and 35 and series 48, image 25). Abnormal FLAIR signal in this region is similar to the prior study (series 6, images 16 and 18). No abnormal leptomeningeal enhancement is identified. There is a punctate focus of subtly increased trace diffusion signal in the inferior ventral medulla right of midline, decreased from prior. There is associated enhancement in the medulla (best seen on the cervical spine examination, series 45, image 8). No new regions of signal abnormality are identified in the brain. There is no evidence of acute infarct, intracranial hemorrhage, mass, midline shift, or extra-axial fluid collection. Patchy T2 hyperintensities in the cerebral white matter and pons are unchanged and nonspecific, moderate for age. Vascular: Major intracranial vascular flow  voids are preserved. Skull and upper cervical spine: Unremarkable bone marrow signal. Sinuses/Orbits: Unchanged small right mastoid effusion. Clear paranasal sinuses. Bilateral cataract extraction. Other: None. MRI CERVICAL SPINE FINDINGS Alignment: Straightening and slight reversal of the normal cervical lordosis. No listhesis. Vertebrae: No evidence of fracture or suspicious osseous lesion. C3-C7  degenerative endplate changes. Cord: Abnormal T2 hyperintensity within the cord from C2-C7 has decreased in intensity, and cord expansion has decreased. Associated enhancement in the cord from C2-C4 has increased from the prior study and is more confluent with greatest involvement of the dorsal cord. More central cord enhancement at C6-7 has mildly decreased. Posterior Fossa, vertebral arteries, paraspinal tissues: Small focus of enhancement in the inferior ventral medulla may have minimally increased. Disc levels: Axial images are motion degraded, particularly the MERGE sequence. Disc degeneration does not appear significantly changed from the recent prior examination, most notable at C5-6 where a broad-based posterior disc osteophyte complex with more focal left paracentral component results in moderate to severe spinal stenosis with mild-to-moderate cord flattening and severe right and moderate to severe left neural foraminal stenosis. MRI THORACIC SPINE FINDINGS Alignment:  Normal. Vertebrae: No evidence of thoracic spine fracture. 10 mm T2 hyperintense focus in the T2 vertebral body is unchanged and may represent an atypical hemangioma. Cord: Patchy T2 hyperintensity within the spinal cord at T2, T6, and T7-8 has decreased. The cord still appears slightly expanded at T7-8. There is persistent enhancement in each of these areas of T2 signal abnormality, however the extent and intensity of the enhancement have decreased. No new cord lesions are identified. Paraspinal and other soft tissues: Unremarkable. Disc levels: Unchanged right central disc protrusion at T5-6 contacting and slightly flattening the ventral spinal cord without significant stenosis. MRI LUMBAR SPINE FINDINGS Segmentation:  Standard. Alignment: Facet mediated grade 1 anterolisthesis of L4 on L5, unchanged. Vertebrae: No evidence of fracture or suspicious osseous lesion. Large L1 vertebral body hemangioma. Conus medullaris: Extends to the L1-2  level and appears normal. The cauda equina is unremarkable. No abnormal intradural enhancement or nerve root thickening. Paraspinal and other soft tissues: Prominent bladder distention though less than on the prior MRI. Disc levels: Unchanged lumbar disc and facet degeneration compared to the recent prior examination, most notable at L4-5 where there is anterolisthesis with bulging uncovered disc and advanced facet arthrosis resulting in mild spinal stenosis, moderate bilateral lateral recess stenosis, and moderate bilateral neural foraminal stenosis. IMPRESSION: 1. Improved appearance of the paramedian frontal and cingulate gyrus signal abnormality, with resolved restricted diffusion and decreased enhancement. 2. Punctate signal abnormality in the medulla with interval decreased diffusion abnormality and stable to slightly increased enhancement. 3. No new intracranial abnormality. 4. Decreased patchy T2 signal abnormality and expansion throughout the cervical and thoracic spinal cord. Cord enhancement from C2-C4 has increased, while cord enhancement elsewhere has improved. 5. No new cord lesions. Electronically Signed   By: Logan Bores M.D.   On: 10/03/2016 07:40   Mr Thoracic Spine W Wo Contrast  Result Date: 10/03/2016 CLINICAL DATA:  Recent hospitalization for HSV encephalitis and myelitis. Progressive decline in functional status with worsening weakness, decreased ability to walk, involuntary hand movements, and loss of coordination. Numbness from the nipple line down. EXAM: MRI HEAD WITHOUT AND WITH CONTRAST MRI CERVICAL SPINE WITHOUT AND WITH CONTRAST MRI THORACIC SPINE WITHOUT AND WITH CONTRAST MRI LUMBAR SPINE WITHOUT AND WITH CONTRAST TECHNIQUE: Multiplanar, multiecho pulse sequences of the brain and surrounding structures, cervical spine, to include the craniocervical junction and cervicothoracic junction,  and thoracic and lumbar spine were obtained without and with intravenous contrast. CONTRAST:   13 mL MultiHance COMPARISON:  Brain MRI 09/23/2016.  Total spine MRI 09/21/2015. FINDINGS: MRI HEAD FINDINGS Brain: Cortically based signal abnormality in the paramedian frontal and cingulate gyri has overall improved. There is no residual diffusion abnormality in this region, and only minimal cortical enhancement remains, greater on the left (series 47, images 30 and 35 and series 48, image 25). Abnormal FLAIR signal in this region is similar to the prior study (series 6, images 16 and 18). No abnormal leptomeningeal enhancement is identified. There is a punctate focus of subtly increased trace diffusion signal in the inferior ventral medulla right of midline, decreased from prior. There is associated enhancement in the medulla (best seen on the cervical spine examination, series 45, image 8). No new regions of signal abnormality are identified in the brain. There is no evidence of acute infarct, intracranial hemorrhage, mass, midline shift, or extra-axial fluid collection. Patchy T2 hyperintensities in the cerebral white matter and pons are unchanged and nonspecific, moderate for age. Vascular: Major intracranial vascular flow voids are preserved. Skull and upper cervical spine: Unremarkable bone marrow signal. Sinuses/Orbits: Unchanged small right mastoid effusion. Clear paranasal sinuses. Bilateral cataract extraction. Other: None. MRI CERVICAL SPINE FINDINGS Alignment: Straightening and slight reversal of the normal cervical lordosis. No listhesis. Vertebrae: No evidence of fracture or suspicious osseous lesion. C3-C7 degenerative endplate changes. Cord: Abnormal T2 hyperintensity within the cord from C2-C7 has decreased in intensity, and cord expansion has decreased. Associated enhancement in the cord from C2-C4 has increased from the prior study and is more confluent with greatest involvement of the dorsal cord. More central cord enhancement at C6-7 has mildly decreased. Posterior Fossa, vertebral arteries,  paraspinal tissues: Small focus of enhancement in the inferior ventral medulla may have minimally increased. Disc levels: Axial images are motion degraded, particularly the MERGE sequence. Disc degeneration does not appear significantly changed from the recent prior examination, most notable at C5-6 where a broad-based posterior disc osteophyte complex with more focal left paracentral component results in moderate to severe spinal stenosis with mild-to-moderate cord flattening and severe right and moderate to severe left neural foraminal stenosis. MRI THORACIC SPINE FINDINGS Alignment:  Normal. Vertebrae: No evidence of thoracic spine fracture. 10 mm T2 hyperintense focus in the T2 vertebral body is unchanged and may represent an atypical hemangioma. Cord: Patchy T2 hyperintensity within the spinal cord at T2, T6, and T7-8 has decreased. The cord still appears slightly expanded at T7-8. There is persistent enhancement in each of these areas of T2 signal abnormality, however the extent and intensity of the enhancement have decreased. No new cord lesions are identified. Paraspinal and other soft tissues: Unremarkable. Disc levels: Unchanged right central disc protrusion at T5-6 contacting and slightly flattening the ventral spinal cord without significant stenosis. MRI LUMBAR SPINE FINDINGS Segmentation:  Standard. Alignment: Facet mediated grade 1 anterolisthesis of L4 on L5, unchanged. Vertebrae: No evidence of fracture or suspicious osseous lesion. Large L1 vertebral body hemangioma. Conus medullaris: Extends to the L1-2 level and appears normal. The cauda equina is unremarkable. No abnormal intradural enhancement or nerve root thickening. Paraspinal and other soft tissues: Prominent bladder distention though less than on the prior MRI. Disc levels: Unchanged lumbar disc and facet degeneration compared to the recent prior examination, most notable at L4-5 where there is anterolisthesis with bulging uncovered disc  and advanced facet arthrosis resulting in mild spinal stenosis, moderate bilateral lateral recess stenosis, and  moderate bilateral neural foraminal stenosis. IMPRESSION: 1. Improved appearance of the paramedian frontal and cingulate gyrus signal abnormality, with resolved restricted diffusion and decreased enhancement. 2. Punctate signal abnormality in the medulla with interval decreased diffusion abnormality and stable to slightly increased enhancement. 3. No new intracranial abnormality. 4. Decreased patchy T2 signal abnormality and expansion throughout the cervical and thoracic spinal cord. Cord enhancement from C2-C4 has increased, while cord enhancement elsewhere has improved. 5. No new cord lesions. Electronically Signed   By: Logan Bores M.D.   On: 10/03/2016 07:40   Mr Lumbar Spine W Wo Contrast (assess For Abscess, Cord Compression)  Result Date: 10/03/2016 CLINICAL DATA:  Recent hospitalization for HSV encephalitis and myelitis. Progressive decline in functional status with worsening weakness, decreased ability to walk, involuntary hand movements, and loss of coordination. Numbness from the nipple line down. EXAM: MRI HEAD WITHOUT AND WITH CONTRAST MRI CERVICAL SPINE WITHOUT AND WITH CONTRAST MRI THORACIC SPINE WITHOUT AND WITH CONTRAST MRI LUMBAR SPINE WITHOUT AND WITH CONTRAST TECHNIQUE: Multiplanar, multiecho pulse sequences of the brain and surrounding structures, cervical spine, to include the craniocervical junction and cervicothoracic junction, and thoracic and lumbar spine were obtained without and with intravenous contrast. CONTRAST:  13 mL MultiHance COMPARISON:  Brain MRI 09/23/2016.  Total spine MRI 09/21/2015. FINDINGS: MRI HEAD FINDINGS Brain: Cortically based signal abnormality in the paramedian frontal and cingulate gyri has overall improved. There is no residual diffusion abnormality in this region, and only minimal cortical enhancement remains, greater on the left (series 47, images  30 and 35 and series 48, image 25). Abnormal FLAIR signal in this region is similar to the prior study (series 6, images 16 and 18). No abnormal leptomeningeal enhancement is identified. There is a punctate focus of subtly increased trace diffusion signal in the inferior ventral medulla right of midline, decreased from prior. There is associated enhancement in the medulla (best seen on the cervical spine examination, series 45, image 8). No new regions of signal abnormality are identified in the brain. There is no evidence of acute infarct, intracranial hemorrhage, mass, midline shift, or extra-axial fluid collection. Patchy T2 hyperintensities in the cerebral white matter and pons are unchanged and nonspecific, moderate for age. Vascular: Major intracranial vascular flow voids are preserved. Skull and upper cervical spine: Unremarkable bone marrow signal. Sinuses/Orbits: Unchanged small right mastoid effusion. Clear paranasal sinuses. Bilateral cataract extraction. Other: None. MRI CERVICAL SPINE FINDINGS Alignment: Straightening and slight reversal of the normal cervical lordosis. No listhesis. Vertebrae: No evidence of fracture or suspicious osseous lesion. C3-C7 degenerative endplate changes. Cord: Abnormal T2 hyperintensity within the cord from C2-C7 has decreased in intensity, and cord expansion has decreased. Associated enhancement in the cord from C2-C4 has increased from the prior study and is more confluent with greatest involvement of the dorsal cord. More central cord enhancement at C6-7 has mildly decreased. Posterior Fossa, vertebral arteries, paraspinal tissues: Small focus of enhancement in the inferior ventral medulla may have minimally increased. Disc levels: Axial images are motion degraded, particularly the MERGE sequence. Disc degeneration does not appear significantly changed from the recent prior examination, most notable at C5-6 where a broad-based posterior disc osteophyte complex with more  focal left paracentral component results in moderate to severe spinal stenosis with mild-to-moderate cord flattening and severe right and moderate to severe left neural foraminal stenosis. MRI THORACIC SPINE FINDINGS Alignment:  Normal. Vertebrae: No evidence of thoracic spine fracture. 10 mm T2 hyperintense focus in the T2 vertebral body is unchanged and  may represent an atypical hemangioma. Cord: Patchy T2 hyperintensity within the spinal cord at T2, T6, and T7-8 has decreased. The cord still appears slightly expanded at T7-8. There is persistent enhancement in each of these areas of T2 signal abnormality, however the extent and intensity of the enhancement have decreased. No new cord lesions are identified. Paraspinal and other soft tissues: Unremarkable. Disc levels: Unchanged right central disc protrusion at T5-6 contacting and slightly flattening the ventral spinal cord without significant stenosis. MRI LUMBAR SPINE FINDINGS Segmentation:  Standard. Alignment: Facet mediated grade 1 anterolisthesis of L4 on L5, unchanged. Vertebrae: No evidence of fracture or suspicious osseous lesion. Large L1 vertebral body hemangioma. Conus medullaris: Extends to the L1-2 level and appears normal. The cauda equina is unremarkable. No abnormal intradural enhancement or nerve root thickening. Paraspinal and other soft tissues: Prominent bladder distention though less than on the prior MRI. Disc levels: Unchanged lumbar disc and facet degeneration compared to the recent prior examination, most notable at L4-5 where there is anterolisthesis with bulging uncovered disc and advanced facet arthrosis resulting in mild spinal stenosis, moderate bilateral lateral recess stenosis, and moderate bilateral neural foraminal stenosis. IMPRESSION: 1. Improved appearance of the paramedian frontal and cingulate gyrus signal abnormality, with resolved restricted diffusion and decreased enhancement. 2. Punctate signal abnormality in the medulla  with interval decreased diffusion abnormality and stable to slightly increased enhancement. 3. No new intracranial abnormality. 4. Decreased patchy T2 signal abnormality and expansion throughout the cervical and thoracic spinal cord. Cord enhancement from C2-C4 has increased, while cord enhancement elsewhere has improved. 5. No new cord lesions. Electronically Signed   By: Logan Bores M.D.   On: 10/03/2016 07:40        Scheduled Meds: . acyclovir (ZOVIRAX) </= 700 mg IVPB  10 mg/kg Intravenous Q8H  . enoxaparin (LOVENOX) injection  40 mg Subcutaneous Q24H  . gabapentin  100 mg Oral TID  . methylPREDNISolone (SOLU-MEDROL) injection  1,000 mg Intravenous Q24H  . pantoprazole  40 mg Oral Daily   Continuous Infusions: . sodium chloride 75 mL/hr at 10/04/16 0512     LOS: 1 day    Time spent:  > 35 minutes  Velvet Bathe, MD [facility] Pager [phone]  If 7PM-7AM, please contact night-coverage [email or web address] Password [email or web address] 10/04/2016, 5:40 PM

## 2016-10-04 NOTE — Evaluation (Addendum)
Physical Therapy Evaluation Patient Details Name: Katelyn Lamb MRN: [id number] DOB: [birth year] Today's Date: 10/04/2016   History of Present Illness  71 y.o.femalewith medical history significant of anxiety andchronic back pain; presents with a five-day history of progressively worsening weakness. Just recently hospitalized here at [facility] 1/18-1/24for HSV encephalitis and myelitis. Husband notes a gradual decline the patient's overall functional status including decreased ability to walk, involuntary hand movements,and loss of coordination. Patient reports associated symptoms of numbness from the nipple line down, increased work of breathing,and feeling as though a band is wrapped around waist.  Clinical Impression  Pt admitted with above diagnosis. Pt currently with functional limitations due to the deficits listed below (see PT Problem List). Pt mod assist overall of 2 persons.  Very weak and ataxic/dysmetria all 4's.  Will progress as able.  Would benefit from Rehab.  Pt and husband agree and pt has 24 hour care at d/c.MD:  Pt has back pain and was scheduled to have injection by Dr. Nelva Bush.  Can he be consulted while pt here?    Pt will benefit from skilled PT to increase their independence and safety with mobility to allow discharge to the venue listed below.      Follow Up Recommendations CIR;Supervision/Assistance - 24 hour    Equipment Recommendations  None recommended by PT    Recommendations for Other Services Rehab consult     Precautions / Restrictions Precautions Precautions: Fall Restrictions Weight Bearing Restrictions: No      Mobility  Bed Mobility Overal bed mobility: Needs Assistance Bed Mobility: Supine to Sit;Sit to Supine     Supine to sit: Mod assist Sit to supine: Min guard   General bed mobility comments: Assist for trunk elevation from supine to sit and scooting hips out toward EOB. Cues for sequencing and hand placement.Took incr time for pt to  scoot to EOB and did best with reciprocal scooting. Had to have reminders to place hands correctly as to not injure them.   Transfers Overall transfer level: Needs assistance Equipment used: Rolling walker (2 wheeled) Transfers: Sit to/from Stand Sit to Stand: Mod assist;+2 safety/equipment         General transfer comment: Mod assist to boost up from EOB and couch. Cues for hand placement and technique as pt with athetoid and dysmetric movements making transitions difficult and unsteady.   Ambulation/Gait Ambulation/Gait assistance: Mod assist;+2 physical assistance Ambulation Distance (Feet): 20 Feet (10 feet x 2) Assistive device: Rolling walker (2 wheeled) Gait Pattern/deviations: Step-through pattern;Decreased stride length;Decreased dorsiflexion - left;Trunk flexed Gait velocity: decreased Gait velocity interpretation: Below normal speed for age/gender General Gait Details: slow, grossly unsteady gait with assistance needed to steady and cues to separate feet to widen BOS; cues for bilat heel strike.  Pt also had difficulty keeping her right UE on RW due to dysmetria.  Poor postural stability overall.  Husband present and said gait got progressively worse.   Stairs            Wheelchair Mobility    Modified Rankin (Stroke Patients Only)       Balance Overall balance assessment: Needs assistance Sitting-balance support: Feet supported;Bilateral upper extremity supported Sitting balance-Leahy Scale: Fair     Standing balance support: Bilateral upper extremity supported Standing balance-Leahy Scale: Poor Standing balance comment: walker and mod assist  for static standing             High level balance activites: Direction changes;Turns;Sudden stops High Level Balance Comments: mod assist  with turns with cues to stay close to RW.  Ataxia made it difficult as well.              Pertinent Vitals/Pain Pain Assessment: Faces Faces Pain Scale: Hurts even  more Pain Location: nerve pain bil UE and LEs Pain Descriptors / Indicators: Tingling;Numbness;Burning Pain Intervention(s): Limited activity within patient's tolerance;Monitored during session;Repositioned   VSS Home Living Family/patient expects to be discharged to:: Private residence Living Arrangements: Spouse/significant other Available Help at Discharge: Family;Available 24 hours/day Type of Home: House Home Access: Stairs to enter Entrance Stairs-Rails: None Entrance Stairs-Number of Steps: 2 Home Layout: Two level;Able to live on main level with bedroom/bathroom Home Equipment: Bedside commode;Walker - 2 wheels;Adaptive equipment (gait belt)      Prior Function Level of Independence: Needs assistance   Gait / Transfers Assistance Needed: min assist for ambulation with RW.  min asssit to stand from lower chair.  (Sponge bathed; )  ADL's / Homemaking Assistance Needed: Sponge bathed PTA per pt due to PICC -someone helped with all of it; Assist with dressing; A to feed pt as well.    Comments: [place] in the bathroom a few times per pt     Hand Dominance   Dominant Hand: Right    Extremity/Trunk Assessment   Upper Extremity Assessment Upper Extremity Assessment: RUE deficits/detail;LUE deficits/detail RUE Deficits / Details: Strength grossly 4/5. Pt reports impaired sensation. Poor grip strength. Pt with poor coordaintion; dysmetria and athetoid movements. RUE Sensation: decreased light touch;decreased proprioception RUE Coordination: decreased fine motor;decreased gross motor LUE Deficits / Details: Strength grossly 4/5. Pt reports impaired sensation. Poor grip strength. Pt with poor coordaintion; dysmetria and athetoid movements. Pt reports L worse than R side. LUE Sensation: decreased light touch;decreased proprioception LUE Coordination: decreased fine motor;decreased gross motor    Lower Extremity Assessment Lower Extremity Assessment: RLE deficits/detail;LLE  deficits/detail RLE Deficits / Details: strength 4-/5, ataxia RLE Sensation: decreased light touch;decreased proprioception RLE Coordination: decreased fine motor;decreased gross motor LLE Deficits / Details: strength 3/5, some ataxia LLE Sensation: decreased light touch;decreased proprioception LLE Coordination: decreased fine motor;decreased gross motor    Cervical / Trunk Assessment Cervical / Trunk Assessment: Kyphotic  Communication   Communication: No difficulties  Cognition Arousal/Alertness: Awake/alert Behavior During Therapy: Anxious;WFL for tasks assessed/performed Overall Cognitive Status: Within Functional Limits for tasks assessed                      General Comments      Exercises General Exercises - Lower Extremity Ankle Circles/Pumps: AROM;Both;15 reps Long Arc Quad: AROM;Both;5 reps;Seated   Assessment/Plan    PT Assessment Patient needs continued PT services  PT Problem List Decreased strength;Decreased activity tolerance;Decreased balance;Decreased mobility;Decreased knowledge of use of DME;Decreased coordination;Decreased safety awareness;Decreased knowledge of precautions;Decreased cognition          PT Treatment Interventions DME instruction;Gait training;Stair training;Functional mobility training;Therapeutic activities;Therapeutic exercise;Balance training;Neuromuscular re-education;Patient/family education    PT Goals (Current goals can be found in the Care Plan section)  Acute Rehab PT Goals Patient Stated Goal: return to being independent PT Goal Formulation: With patient Time For Goal Achievement: 10/18/16 Potential to Achieve Goals: Good    Frequency Min 3X/week   Barriers to discharge        Co-evaluation PT/OT/SLP Co-Evaluation/Treatment: Yes Reason for Co-Treatment: Complexity of the patient's impairments (multi-system involvement) PT goals addressed during session: Mobility/safety with mobility OT goals addressed during  session: ADL's and self-care       End  of Session Equipment Utilized During Treatment: Gait belt Activity Tolerance: Patient limited by fatigue Patient left: with call bell/phone within reach;in bed;with bed alarm set;with family/visitor present Nurse Communication: Mobility status         Time: 1125-1200 PT Time Calculation (min) (ACUTE ONLY): 35 min   Charges:   PT Evaluation $PT Eval Moderate Complexity: 1 Procedure     PT G Codes:        Monty Mccarrell F Alycia [place] Oct 14, 2016, 4:28 PM  [facility] [phone] (pager)

## 2016-10-04 NOTE — Progress Notes (Signed)
Subjective: She feels slightly improved today  Exam: Vitals:   10/04/16 1024 10/04/16 1415  BP: 99/74 112/68  Pulse: 93 90  Resp: 16 18  Temp: 98.6 F (37 C)     Gen: In bed, NAD MS: Patient is alert and oriented following all commands CN: Cranial nerves II through XII grossly intact Motor: Lower extremities are 5/5. Upper extremities show proximal weakness with 4/5 with distal strength of 5/5. Of note with finger-nose she does have dysmetria on bilateral arms. Sensory: Sensory is grossly intact Deep tendon reflexes are 3+ throughout   Impression: This is a 71 year old female with recent diagnosis of HSV-2 encephalomyelitis. She currently is on IV acyclovir at home but presented to the ED with worsening leg pain, sensory loss and gait instability. She also had worsening of upper extremity weakness and coordination. Imaging reveals improved spinal lesions, but there is evidence of possible worsening inflammation in the cervical area which is where her symptoms would localize.  I think that it could be reasonable to give steroids to try and control inflammation and possibly limit further injury as we continue to treat with acyclovir.  Recommendations: 1) continue acyclovir.  2) IV methylprednisolone, 1000 mg qd.  Roland Rack, MD [facility] [phone]  If 7pm- 7am, please page neurology on call as listed in [place].  10/04/2016, 7:44 PM

## 2016-10-05 DIAGNOSIS — G8250 Quadriplegia, unspecified: Secondary | ICD-10-CM

## 2016-10-05 DIAGNOSIS — G0491 Myelitis, unspecified: Secondary | ICD-10-CM

## 2016-10-05 DIAGNOSIS — K219 Gastro-esophageal reflux disease without esophagitis: Secondary | ICD-10-CM

## 2016-10-05 MED ORDER — PREDNISONE 50 MG PO TABS
50.0000 mg | ORAL_TABLET | Freq: Once | ORAL | Status: AC
  Administered 2016-10-06: 50 mg via ORAL
  Filled 2016-10-05: qty 1

## 2016-10-05 MED ORDER — PREDNISONE 10 MG PO TABS
10.0000 mg | ORAL_TABLET | Freq: Once | ORAL | Status: DC
  Filled 2016-10-05: qty 1

## 2016-10-05 MED ORDER — PREDNISONE 20 MG PO TABS
20.0000 mg | ORAL_TABLET | Freq: Once | ORAL | Status: DC
  Filled 2016-10-05: qty 1

## 2016-10-05 MED ORDER — PREDNISONE 50 MG PO TABS
60.0000 mg | ORAL_TABLET | Freq: Once | ORAL | Status: AC
  Administered 2016-10-05: 60 mg via ORAL
  Filled 2016-10-05: qty 1

## 2016-10-05 MED ORDER — PREDNISONE 20 MG PO TABS
40.0000 mg | ORAL_TABLET | Freq: Once | ORAL | Status: AC
  Administered 2016-10-07: 40 mg via ORAL
  Filled 2016-10-05: qty 2

## 2016-10-05 MED ORDER — PREDNISONE 10 MG PO TABS
30.0000 mg | ORAL_TABLET | Freq: Once | ORAL | Status: AC
  Administered 2016-10-08: 30 mg via ORAL
  Filled 2016-10-05 (×2): qty 1

## 2016-10-05 MED ORDER — SODIUM CHLORIDE 0.9% FLUSH: 10.0000 mL | INTRAVENOUS | Status: DC | PRN

## 2016-10-05 NOTE — Clinical Social Work Note (Signed)
Clinical Social Work Assessment  Patient Details  Name: Katelyn Lamb MRN: [id number] Date of Birth: 1946-08-30  Date of referral:  10/05/16               Reason for consult:  Facility Placement, Discharge Planning                Permission sought to share information with:  Family Supports Permission granted to share information::  Yes, Verbal Permission Granted  Name::     Annie Main Topping  Relationship::  husband  Contact Information:  [phone]  Housing/Transportation Living arrangements for the past 2 months:  [place] of Information:  Patient Patient Interpreter Needed:  None Criminal Activity/Legal Involvement Pertinent to Current Situation/Hospitalization:  No - Comment as needed Significant Relationships:  Adult Children, Pets, Spouse Lives with:  Spouse Do you feel safe going back to the place where you live?  Yes Need for family participation in patient care:  No (Coment)  Care giving concerns:  No care giving concerns identified.   Social Worker assessment / plan:  CSW met with pt to address consult for New SNF as a back up. CSW introduced herself and explained role of social work, and the process of discharging to SNF. PT is recommending CIR. Pt is agreeable to rehab. CSW initiated bed search and will follow up with bed offers. CSW will continue to follow.   Employment status:  Retired Nurse, adult PT Recommendations:  Inpatient [place] / Referral to community resources:  [place]  Patient/Family's Response to care:  Pt was appreciative of CSW support.   Patient/Family's Understanding of and Emotional Response to Diagnosis, Current Treatment, and Prognosis:  Pt understands that she would benefit from rehab prior to returning home.   Emotional Assessment Appearance:  Appears stated age Attitude/Demeanor/Rapport:   (appropriate) Affect (typically observed):  Accepting, Adaptable,  Pleasant Orientation:  Oriented to Self, Oriented to Place, Oriented to  Time, Oriented to Situation Alcohol / Substance use:  Not Applicable Psych involvement (Current and /or in the community):  No (Comment)  Discharge Needs  Concerns to be addressed:  Adjustment to Illness Readmission within the last 30 days:  No Current discharge risk:  Chronically ill Barriers to Discharge:  Continued Medical Work up   [employer], LCSW 10/05/2016, 4:41 PM

## 2016-10-05 NOTE — Progress Notes (Signed)
PROGRESS NOTE    Katelyn Lamb  [id number] DOB: 12-Sep-1945 DOA: 10/02/2016 PCP: [name], MD   Brief Narrative:  71 y.o. female with medical history significant of anxiety and chronic back pain; presents with a five-day history of progressively worsening weakness. 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 - Neurology on board and managing - ID consulted     Thrombocytopenia ([place]) - Stable and improved from last check    Anxiety - Stable    DVT prophylaxis: Lovenox Code Status: Full Family Communication: None at bedside Disposition Plan: Once cleared for discharge by specialist   Consultants:   ID  Neurology   Procedures: None   Antimicrobials: Acyclovir   Subjective: The patient has no new complaints.   Objective: Vitals:   10/04/16 1024 10/04/16 1415 10/04/16 2154 10/05/16 0618  BP: 99/74 112/68 (!) 98/54 130/72  Pulse: 93 90 74 100  Resp: 16 18 16 16   Temp: 98.6 F (37 C)  98.6 F (37 C) 98 F (36.7 C)  TempSrc: Oral  Oral   SpO2: 97% 100% 96% 93%  Weight:      Height:        Intake/Output Summary (Last 24 hours) at 10/05/16 1621 Last data filed at 10/05/16 [id number]  Gross per 24 hour  Intake             2755 ml  Output                0 ml  Net             2755 ml   Filed Weights   10/02/16 1943  Weight: 63.5 kg (140 lb)    Examination:  General exam: Appears calm and comfortable , In no acute distress Respiratory system: Clear to auscultation. Respiratory effort normal., No wheezes Cardiovascular system: S1 & S2 heard, RRR. No JVD, murmurs, rubs, gallops or clicks. No pedal edema. Gastrointestinal system: Abdomen is nondistended, soft and nontender. No  organomegaly or masses felt. Normal bowel sounds heard. Central nervous system: Alert and oriented. No facial asymmetry Extremities: No cyanosis Skin: No rashes, lesions or ulcers, limited exam Psychiatry: Judgement and insight appear normal. Mood & affect appropriate.   Data Reviewed: I have personally reviewed following labs and imaging studies  CBC:  Recent Labs Lab 10/02/16 2153 10/03/16 0719  WBC 8.2 6.3  NEUTROABS 4.7 5.7  HGB 12.9 13.3  HCT 39.7 40.2  MCV 96.8 94.4  PLT 127* [id number]*   Basic Metabolic Panel:  Recent Labs Lab 10/02/16 2153  NA 136  K 3.8  CL 98*  CO2 25  GLUCOSE 126*  BUN 12  CREATININE 0.86  CALCIUM 8.5*   GFR: Estimated Creatinine Clearance: 58.6 mL/min (by C-G formula based on SCr of 0.86 mg/dL). Liver Function Tests:  Recent Labs Lab 10/02/16 2153  AST 19  ALT 22  ALKPHOS 69  BILITOT 0.4  PROT 5.2*  ALBUMIN 2.7*   No results for input(s): LIPASE, AMYLASE in the last 168 hours. No results for input(s): AMMONIA in the last 168 hours. Coagulation Profile: No results for input(s): INR, PROTIME in the last 168 hours. Cardiac Enzymes: No results for input(s): CKTOTAL, CKMB, CKMBINDEX, TROPONINI in the last 168  hours. BNP (last 3 results) No results for input(s): PROBNP in the last 8760 hours. HbA1C: No results for input(s): HGBA1C in the last 72 hours. CBG: No results for input(s): GLUCAP in the last 168 hours. Lipid Profile: No results for input(s): CHOL, HDL, LDLCALC, TRIG, CHOLHDL, LDLDIRECT in the last 72 hours. Thyroid Function Tests: No results for input(s): TSH, T4TOTAL, FREET4, T3FREE, THYROIDAB in the last 72 hours. Anemia Panel: No results for input(s): VITAMINB12, FOLATE, FERRITIN, TIBC, IRON, RETICCTPCT in the last 72 hours. Sepsis Labs: No results for input(s): PROCALCITON, LATICACIDVEN in the last 168 hours.  No results found for this or any previous visit (from the past 240 hour(s)).       Radiology  Studies: No results found.      Scheduled Meds: . acyclovir (ZOVIRAX) </= 700 mg IVPB  10 mg/kg Intravenous Q8H  . enoxaparin (LOVENOX) injection  40 mg Subcutaneous Q24H  . gabapentin  100 mg Oral TID  . pantoprazole  40 mg Oral Daily  . [START ON 10/10/2016] predniSONE  10 mg Oral Once  . [START ON 10/09/2016] predniSONE  20 mg Oral Once  . [START ON 10/08/2016] predniSONE  30 mg Oral Once  . [START ON 10/07/2016] predniSONE  40 mg Oral Once  . [START ON 10/06/2016] predniSONE  50 mg Oral Once  . predniSONE  60 mg Oral Once   Continuous Infusions: . sodium chloride 75 mL/hr at 10/04/16 0512     LOS: 2 days    Time spent:  > 35 minutes  Velvet Bathe, MD [facility] Pager [phone]  If 7PM-7AM, please contact night-coverage [email or web address] Password [email or web address] 10/05/2016, 4:21 PM

## 2016-10-05 NOTE — Clinical Social Work Placement (Signed)
   CLINICAL SOCIAL WORK PLACEMENT  NOTE  Date:  10/05/2016  Patient Details  Name: Katelyn Lamb MRN: [id number] Date of Birth: 10-06-1945  Clinical Social Work is seeking post-discharge placement for this patient at the [place] level of care (*CSW will initial, date and re-position this form in  chart as items are completed):  Yes   Patient/family provided with [place] Work Department's list of facilities offering this level of care within the geographic area requested by the patient (or if unable, by the patient's family).  Yes   Patient/family informed of their freedom to choose among providers that offer the needed level of care, that participate in Medicare, Medicaid or managed care program needed by the patient, have an available bed and are willing to accept the patient.  Yes   Patient/family informed of [facility]'s ownership interest in [facility] and [facility], as well as of the fact that they are under no obligation to receive care at these facilities.  PASRR submitted to EDS on 10/04/16     PASRR number received on 10/04/16     Existing PASRR number confirmed on       FL2 transmitted to all facilities in geographic area requested by pt/family on 10/04/16     FL2 transmitted to all facilities within larger geographic area on       Patient informed that his/her managed care company has contracts with or will negotiate with certain facilities, including the following:            Patient/family informed of bed offers received.  Patient chooses bed at       Physician recommends and patient chooses bed at      Patient to be transferred to   on  .  Patient to be transferred to facility by       Patient family notified on   of transfer.  Name of family member notified:        PHYSICIAN       Additional Comment:    _______________________________________________ Darden Dates, LCSW 10/05/2016, 4:35 PM

## 2016-10-05 NOTE — Progress Notes (Signed)
I met with pt and her spouse at bedside to discuss a possible inpt rehab admission pending insurance approval when pt medically ready to d/c. They are frustrated for the 4 ED visits and 2 admissions with her continued decline in functional mobility. They are hopeful for an inpt rehab admission prior to d/c home. I have contacted [employer] go begin authorization for a possible inpt rehab admission. I will follow up on Monday. [phone]

## 2016-10-05 NOTE — Progress Notes (Signed)
Subjective: Feels improved   Exam: Vitals:   10/04/16 2154 10/05/16 0618  BP: (!) 98/54 130/72  Pulse: 74 100  Resp: 16 16  Temp: 98.6 F (37 C) 98 F (36.7 C)    Gen: In bed, NAD MS: Patient is alert and oriented following all commands CN: Cranial nerves II through XII grossly intact Motor: Lower extremities are 5/5 on right, 4/5 on left proximally. Upper extremities show proximal weakness with 4-/5 with distal strength of 4/5. Of note with finger-nose she does have dysmetria on bilateral arms but improved Sensory: Sensory is grossly intact Deep tendon reflexes are 3+ throughout    Pertinent Labs/Diagnostics: PLTs [place] PA-C [facility] [phone]  Impression: This is a 71 year old female with recent diagnosis of HSV-2 encephalomyelitis. She currently is on IV acyclovir  At home and now on IV solumedrol. Patient feels like she is getting improvemetn, but I am not sure that she is much differnet objectively. She has now completed three day course of high dose steroids, and given she already recoeved another course, I would favor weaning this now. I have ordered prednisone taper.    Recommendations: 1) continue acyclovir.  2) Prednisone 60mg , then decrease by 10mg  daily to off.  3) Neurology will sign off at this time, please call with further questions or concerns.   Roland Rack, MD [facility] [phone]  If 7pm- 7am, please page neurology on call as listed in [place].   10/05/2016, 11:48 AM

## 2016-10-05 NOTE — Consult Note (Signed)
Physical Medicine and Rehabilitation Consult Reason for Consult: Myelitis Referring Physician: [facility]   HPI: Katelyn Lamb is a 71 y.o. right handed female with history of anxiety and chronic back pain. Presented with a five-day history of progressive worsening weakness of lower extremities. Just recently hospitalized at [facility] 1/18-1/24 for HSV encephalitis and myelitis discharged to home with a PICC line to complete 21 day course of IV acyclovir. Per chart review patient lives with spouse needed minimal assist for ambulation with rolling walker. Minimal assist to stand from a low chair. She needed some assistance with dressing. 2 level home with bedroom on first floor. After being discharged to home she was not continued on high dose steroids previously given during her hospitalization. Husband reported a gradual decline overall functional status with associated symptoms of numbness from the nipple line down as well as involuntary hand movements. Noted bladder and bowel incontinence. Presented 10/03/2016 due to her increasing weakness. MRI of the brain showed no new intracranial abnormality. MRI of the spine showed improved appearance of the paramedian frontal and cingulate gyrus signal abnormality with resolved restricted diffusion and decreased enhancement. Decreased patchy T2 signal abnormality and expansion throughout the cervical and thoracic cord. Cord enhancement from C2-C4 had increased. Neurology follow-up placed on intravenous Solu-Medrol. Infectious disease follow-up advised to finish out 21 day course of plan IV acyclovir. Subcutaneous Lovenox for DVT prophylaxis. Tolerating a regular diet. Physical therapy evaluation completed 10/04/2016 with recommendations of physical medicine rehabilitation consult.   Review of Systems  Constitutional: Negative for chills and fever.  HENT: Negative for hearing loss and tinnitus.   Eyes: Negative for blurred vision and double vision.    Respiratory: Negative for cough and shortness of breath.   Cardiovascular: Negative for chest pain, palpitations and leg swelling.  Gastrointestinal: Negative for abdominal pain, nausea and vomiting.  Genitourinary: Negative for hematuria.       Bladder incontinence  Musculoskeletal: Positive for joint pain, myalgias and neck pain. Negative for falls.  Skin: Negative for rash.  Neurological: Positive for sensory change and weakness. Negative for seizures and loss of consciousness.  Psychiatric/Behavioral:       Anxiety  All other systems reviewed and are negative.  Past Medical History:  Diagnosis Date  . Anxiety   . Back pain   . Myelitis due to herpes simplex [facility])    Past Surgical History:  Procedure Laterality Date  . ABDOMINAL HYSTERECTOMY    . BLADDER REPAIR    . CESAREAN SECTION    . TUBAL LIGATION     Family History  Problem Relation Age of Onset  . Hypertension Mother    Social History:  reports that she has quit smoking. She has never used smokeless tobacco. She reports that she does not drink alcohol or use drugs. Allergies:  Allergies  Allergen Reactions  . Demerol [Meperidine] Other (See Comments)    Hallucinations  . Amoxicillin-Pot Clavulanate Other (See Comments)  . Penicillins Other (See Comments)   Medications Prior to Admission  Medication Sig Dispense Refill  . acyclovir 650 mg in dextrose 5 % 100 mL Inject 650 mg into the vein every 8 (eight) hours. TAKE FOR 16 MORE DAYS. 31200 mg 0  . ALPRAZolam (XANAX) 1 MG tablet Take 1 mg by mouth at bedtime as needed for anxiety or sleep.     . diazepam (VALIUM) 5 MG tablet Take 1 tablet (5 mg total) by mouth 2 (two) times daily. (Patient taking differently:  Take 5 mg by mouth every 12 (twelve) hours as needed for muscle spasms. ) 10 tablet 0  . gabapentin (NEURONTIN) 100 MG capsule Take 100 mg by mouth 3 (three) times daily.    Marland Kitchen HYDROcodone-acetaminophen (NORCO) 10-325 MG per tablet Take 1 tablet by mouth  every 8 (eight) hours as needed for moderate pain. 30 tablet 0  . calcium carbonate (OS-CAL) 600 MG TABS tablet Take 600 mg by mouth 2 (two) times daily with a meal.    . cholecalciferol (VITAMIN D) 1000 units tablet Take 1,000 Units by mouth daily.    . diphenhydramine-acetaminophen (TYLENOL PM) 25-500 MG TABS tablet Take 1 tablet by mouth at bedtime as needed (sleep/pain).    Marland Kitchen ketorolac (TORADOL) 10 MG tablet Take 1 tablet (10 mg total) by mouth every 6 (six) hours as needed. (Patient not taking: Reported on 10/02/2016) 20 tablet 0  . omega-3 acid ethyl esters (LOVAZA) 1 G capsule Take by mouth 2 (two) times daily.      Home: Home Living Family/patient expects to be discharged to:: Private residence Living Arrangements: Spouse/significant other Available Help at Discharge: Family, Available 24 hours/day Type of Home: House Home Access: Stairs to enter [employer] of Steps: 2 Entrance Stairs-Rails: None Home Layout: Two level, Able to live on main level with bedroom/bathroom Bathroom Shower/Tub: Tub/shower unit, Architectural technologist: Standard Bathroom Accessibility: Yes (can go in sideways) Home Equipment: Bedside commode, Walker - 2 wheels, Adaptive equipment (gait belt) Adaptive Equipment: Reacher  Functional History: Prior Function Level of Independence: Needs assistance Gait / Transfers Assistance Needed: min assist for ambulation with RW.  min asssit to stand from lower chair.  (Sponge bathed; ) ADL's / Homemaking Assistance Needed: Sponge bathed PTA per pt due to PICC -someone helped with all of it; Assist with dressing; A to feed pt as well.   Comments: [place] in the bathroom a few times per pt Functional Status:  Mobility: Bed Mobility Overal bed mobility: Needs Assistance Bed Mobility: Supine to Sit, Sit to Supine Supine to sit: Mod assist Sit to supine: Min guard General bed mobility comments: Assist for trunk elevation from supine to sit and scooting hips  out toward EOB. Cues for sequencing and hand placement.Took incr time for pt to scoot to EOB and did best with reciprocal scooting. Had to have reminders to place hands correctly as to not injure them.  Transfers Overall transfer level: Needs assistance Equipment used: Rolling walker (2 wheeled) Transfers: Sit to/from Stand Sit to Stand: Mod assist, +2 safety/equipment General transfer comment: Mod assist to boost up from EOB and couch. Cues for hand placement and technique as pt with athetoid and dysmetric movements making transitions difficult and unsteady.  Ambulation/Gait Ambulation/Gait assistance: Mod assist, +2 physical assistance Ambulation Distance (Feet): 20 Feet (10 feet x 2) Assistive device: Rolling walker (2 wheeled) Gait Pattern/deviations: Step-through pattern, Decreased stride length, Decreased dorsiflexion - left, Trunk flexed General Gait Details: slow, grossly unsteady gait with assistance needed to steady and cues to separate feet to widen BOS; cues for bilat heel strike.  Pt also had difficulty keeping her right UE on RW due to dysmetria.  Poor postural stability overall.  Husband present and said gait got progressively worse.  Gait velocity: decreased Gait velocity interpretation: Below normal speed for age/gender    ADL: ADL Overall ADL's : Needs assistance/impaired Eating/Feeding: Set up, With adaptive utensils, Sitting Eating/Feeding Details (indicate cue type and reason): Pt with orange foam in room to use as built  up handle for utensils. Pt reports she has just been using foam for hand strengthening. Educated pt and husband on use of foam for built up handle; pt able to demo use with spoon to mouth. Grooming: Minimal assistance, Sitting Upper Body Bathing: Minimal assistance, Sitting Lower Body Bathing: Moderate assistance, Sit to/from stand Upper Body Dressing : Minimal assistance, Sitting Upper Body Dressing Details (indicate cue type and reason): to don/doff  hospital gown Lower Body Dressing: Moderate assistance, Sit to/from stand Lower Body Dressing Details (indicate cue type and reason): Pt able to adjust socks in sitting. Anticipate she would have difficulty getting items started over her feet and pulling up clothing in standing. Toilet Transfer: Moderate assistance, +2 for safety/equipment, Ambulation, BSC, RW, Cueing for safety, Cueing for sequencing Toilet Transfer Details (indicate cue type and reason): Simulated by sit to stand from EOB with functional mobility in room. Functional mobility during ADLs: Moderate assistance, +2 for safety/equipment, Rolling walker, Cueing for safety, Cueing for sequencing General ADL Comments: Discussed need for post acute rehab; pt and husband are agreeable and hopeful for CIR placement.  Cognition: Cognition Overall Cognitive Status: Within Functional Limits for tasks assessed Orientation Level: Oriented X4 Cognition Arousal/Alertness: Awake/alert Behavior During Therapy: Anxious, WFL for tasks assessed/performed Overall Cognitive Status: Within Functional Limits for tasks assessed  Blood pressure 130/72, pulse 100, temperature 98 F (36.7 C), resp. rate 16, height 5\' 6"  (1.676 m), weight 63.5 kg (140 lb), SpO2 93 %. Physical Exam  Constitutional: She is oriented to person, place, and time.  HENT:  Head: Normocephalic.  Eyes: EOM are normal.  Neck: Normal range of motion. Neck supple. No thyromegaly present.  Cardiovascular: Normal rate, regular rhythm and normal heart sounds.   Respiratory: Effort normal and breath sounds normal. No respiratory distress.  GI: Soft. Bowel sounds are normal. She exhibits no distension.  Neurological: She is alert and oriented to person, place, and time.  Bilateral sensory loss from neck/collar line to toes. Left seems a little more affected than right. Bilateral limb ataxia Left greater than right. Strength RUE: 4-deltoid, 4/5 biceps,triceps, wrist, hand. LUE: 3- to  3/5 deltoid, 3+ to 4- biceps, triceps, wrist, hand. LE: 3+/5 HF, 3+ to 4- KE and 4/5 ADF/PF.   Skin: Skin is warm and dry.  Psychiatric: She has a normal mood and affect. Her behavior is normal. Thought content normal.    No results found for this or any previous visit (from the past 24 hour(s)). No results found.  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 Does the need for close, 24 hr/day medical supervision in concert with the patient's rehab needs make it unreasonable for this patient to be served in a less intensive setting? Yes 2. Co-Morbidities requiring supervision/potential complications: hyponatremia which requires serial labs, thrombocytopenia--check serial cbc's and monitor clinically, iv acyclovir and steroids, pain mgt 3. Due to bladder management, bowel management, safety, skin/wound care, disease management, medication administration, pain management and patient education, does the patient require 24 hr/day rehab nursing? Yes 4. Does the patient require coordinated care of a physician, rehab nurse, PT (1-2 hrs/day, 5 days/week) and OT (1-2 hrs/day, 5 days/week) to address physical and functional deficits in the context of the above medical diagnosis(es)? Yes Addressing deficits in the following areas: balance, endurance, locomotion, strength, transferring, bowel/bladder control, bathing, dressing, feeding, grooming, toileting and psychosocial support 5. Can the patient actively participate in an intensive therapy program of at least 3 hrs of therapy per day at least  5 days per week? Yes 6. The potential for patient to make measurable gains while on inpatient rehab is excellent 7. Anticipated functional outcomes upon discharge from inpatient rehab are modified independent and supervision  with PT, modified independent and supervision with OT, n/a with SLP. 8. Estimated rehab length of stay to reach the above functional goals is: 13-18  days 9. Does the patient have adequate social supports and living environment to accommodate these discharge functional goals? Yes 10. Anticipated D/C setting: Home 11. Anticipated post D/C treatments: [place] therapy 12. Overall Rehab/Functional Prognosis: excellent  RECOMMENDATIONS: This patient's condition is appropriate for continued rehabilitative care in the following setting: CIR Patient has agreed to participate in recommended program. Yes Note that insurance prior authorization may be required for reimbursement for recommended care.  Comment: Rehab Admissions Coordinator to follow up.  Thanks,  [name], MD, Mellody Drown    Cathlyn Parsons., PA-C 10/05/2016

## 2016-10-05 NOTE — PMR Pre-admission (Signed)
PMR Admission Coordinator Pre-Admission Assessment  Patient: Katelyn Lamb is an 71 y.o., female MRN: [id number] DOB: 28-Jan-1946 Height: 5\' 6"  (167.6 cm) Weight: 63.5 kg (140 lb)              Insurance Information HMO:    PPO: yes     PCP:      IPA:      80/20:      OTHER: medicare advantage plan PRIMARY: [employer]    Policy#: [id number]      Subscriber: pt CM Name: Eula Listen     Phone#: [phone]     Fax#: EPIC Access Pre-Cert#: [id number] approved for 7 days      Employer: retired Benefits:  Phone #: [phone]     Name: 10/08/16 Eff. Date: 09/03/14     Deduct: none      Out of Pocket Max: $3400      Life Max: none CIR: $250 co pay per day days 1-6 then covers 100%      SNF: no co pay days 1-20; $150 co pay per day days 21-90 Outpatient: $15 co pay per visit     Co-Pay: visits per medical neccesity Home Health: $25 co pay per visit      Co-Pay: visits per medical neccesity DME: 80%     Co-Pay: 20% Providers: in network  SECONDARY: none        Medicaid Application Date:       Case Manager:  Disability Application Date:       Case Worker:   Emergency Contact Information Contact Information    Name Relation Home Work Mobile   Mcgann,Stephen Spouse [phone]  [phone]   Raynald Blend Daughter   [phone]   Whitman Hero Grandaughter   [phone]   Simya, Carucci   [phone]     Current Medical History  Patient Admitting Diagnosis: HSV-2 myelitis, 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 Presented with a five-day history of progressive worsening weakness of lower extremities. Just recently hospitalized at [facility] 1/18-1/24 for HSV encephalitis and myelitis discharged to home with a PICC line to complete 21 day course of IV acyclovir.  After being discharged to home she was not continued on high dose steroids previously  given during her hospitalization. Husband reported a gradual decline overall functional status with associated symptoms of numbness from the nipple line down as well as involuntary hand movements. Noted bladder and bowel incontinence. Presented 10/03/2016 due to her increasing weakness. MRI of the brain showed no new intracranial abnormality. MRI of the spine showed improved appearance of the paramedian frontal and cingulate gyrus signal abnormality with resolved restricted diffusion and decreased enhancement. Decreased patchy T2 signal abnormality and expansion throughout the cervical and thoracic cord. Cord enhancement from C2-C4 had increased. Neurology follow-up placed on intravenous Solu-Medrol and transitioned to PO with taper as directed. Infectious disease follow-up advised to complete 21 day course of  IV acyclovir . Subcutaneous Lovenox for DVT prophylaxis. Tolerating a regular diet.   Past Medical History  Past Medical History:  Diagnosis Date  . Anxiety   . Back pain   . Myelitis due to herpes simplex ([place])     Family History  family history includes Hypertension in her mother.  Prior Rehab/Hospitalizations:  Has the patient had major surgery during 100 days prior to admission? No  Current Medications   Current Facility-Administered Medications:  .  0.9 %  sodium chloride infusion, , Intravenous, Continuous, Fredia Sorrow, MD, Last Rate: 75 mL/hr at 10/08/16 1141 .  acetaminophen (TYLENOL) tablet 650 mg, 650 mg, Oral, Q6H PRN **OR** acetaminophen (TYLENOL) suppository 650 mg, 650 mg, Rectal, Q6H PRN, Norval Morton, MD .  acyclovir (ZOVIRAX) 650 mg in dextrose 5 % 100 mL IVPB, 10 mg/kg, Intravenous, Q8H, Rondell A Tamala Julian, MD, Last Rate: 113 mL/hr at 10/08/16 0543, 650 mg at 10/08/16 0543 .  albuterol (PROVENTIL) (2.5 MG/3ML) 0.083% nebulizer solution 2.5 mg, 2.5 mg, Nebulization, Q2H PRN, Norval Morton, MD .  ALPRAZolam Duanne Moron) tablet 1 mg, 1 mg, Oral, Daily PRN, Norval Morton, MD .  diazepam (VALIUM) tablet 5 mg, 5 mg, Oral, Q12H PRN, Norval Morton, MD, 5 mg at 10/07/16 2204 .  enoxaparin (LOVENOX) injection 40 mg, 40 mg, Subcutaneous, Q24H, Rondell A Smith, MD, 40 mg at 10/07/16 1431 .  gabapentin (NEURONTIN) capsule 100 mg, 100 mg, Oral, TID, Norval Morton, MD, 100 mg at 10/08/16 0836 .  HYDROcodone-acetaminophen (NORCO) 10-325 MG per tablet 1 tablet, 1 tablet, Oral, Q4H PRN, Velvet Bathe, MD, 1 tablet at 10/08/16 1138 .  morphine 4 MG/ML injection 2 mg, 2 mg, Intravenous, Q3H PRN, Norval Morton, MD, 2 mg at 10/08/16 0837 .  ondansetron (ZOFRAN) tablet 4 mg, 4 mg, Oral, Q6H PRN **OR** ondansetron (ZOFRAN) injection 4 mg, 4 mg, Intravenous, Q6H PRN, Norval Morton, MD .  pantoprazole (PROTONIX) EC tablet 40 mg, 40 mg, Oral, Daily, Norval Morton, MD, 40 mg at 10/08/16 0836 .  [START ON 10/10/2016] predniSONE (DELTASONE) tablet 10 mg, 10 mg, Oral, Once, Greta Doom, MD .  Derrill Memo ON 10/09/2016] predniSONE (DELTASONE) tablet 20 mg, 20 mg, Oral, Once, Greta Doom, MD .  sodium chloride flush (NS) 0.9 % injection 10-40 mL, 10-40 mL, Intracatheter, PRN, Velvet Bathe, MD  Patients Current Diet: Diet regular Room service appropriate? Yes; Fluid consistency: Thin  Precautions / Restrictions Precautions Precautions: Fall Restrictions Weight Bearing Restrictions: No   Has the patient had 2 or more falls or a fall with injury in the past year?No  Prior Activity Level Community (5-7x/wk): Independent and active pta; recent visit to [facility] to visit friends, very social  Development worker, international aid / [place] Devices/Equipment: Hand-held shower hose Home Equipment: Bedside commode, Environmental consultant - 2 wheels, Adaptive equipment (gait belt)  Prior Device Use: Indicate devices/aids used by the patient prior to current illness, exacerbation or injury? None of the above  Prior Functional Level Prior Function Level of Independence:   (was totally independent prior to this disease process) Gait / Transfers Assistance Needed: min assist for ambulation with RW.  min asssit to stand from lower chair.  ADL's / Homemaking Assistance Needed: Sponge bathed PTA per pt due to PICC -someone helped with all of it; Assist with dressing; A to feed pt as well.   Comments: [place] in the bathroom a few times per pt  Self Care: Did the patient need help bathing, dressing, using the toilet or eating?  Independent  Indoor Mobility: Did the patient need assistance with walking from room to room (with or without device)? Independent  Stairs: Did the patient need assistance with internal or external stairs (with or without device)? Independent  Functional Cognition: Did the patient need help planning regular tasks such as shopping or remembering to take medications? Independent  Current Functional Level Cognition  Overall Cognitive Status: Within Functional Limits for tasks assessed  Orientation Level: Oriented X4    Extremity Assessment (includes Sensation/Coordination)  Upper Extremity Assessment: RUE deficits/detail, LUE deficits/detail RUE Deficits / Details: Strength grossly 4/5. Pt reports impaired sensation. Poor grip strength. Pt with poor coordaintion; dysmetria and athetoid movements. RUE Sensation: decreased light touch, decreased proprioception RUE Coordination: decreased fine motor, decreased gross motor LUE Deficits / Details: Strength grossly 4/5. Pt reports impaired sensation. Poor grip strength. Pt with poor coordaintion; dysmetria and athetoid movements. Pt reports L worse than R side. LUE Sensation: decreased light touch, decreased proprioception LUE Coordination: decreased fine motor, decreased gross motor  Lower Extremity Assessment: RLE deficits/detail, LLE deficits/detail RLE Deficits / Details: strength 4-/5, ataxia RLE Sensation: decreased light touch, decreased proprioception RLE Coordination: decreased fine motor,  decreased gross motor LLE Deficits / Details: strength 3/5, some ataxia LLE Sensation: decreased light touch, decreased proprioception LLE Coordination: decreased fine motor, decreased gross motor    ADLs  Overall ADL's : Needs assistance/impaired Eating/Feeding: Set up, With adaptive utensils, Sitting Eating/Feeding Details (indicate cue type and reason): Pt with orange foam in room to use as built up handle for utensils. Pt reports she has just been using foam for hand strengthening. Educated pt and husband on use of foam for built up handle; pt able to demo use with spoon to mouth. Grooming: Minimal assistance, Sitting Upper Body Bathing: Minimal assistance, Sitting Lower Body Bathing: Moderate assistance, Sit to/from stand Upper Body Dressing : Minimal assistance, Sitting Upper Body Dressing Details (indicate cue type and reason): to don/doff hospital gown Lower Body Dressing: Moderate assistance, Sit to/from stand Lower Body Dressing Details (indicate cue type and reason): Pt able to adjust socks in sitting. Anticipate she would have difficulty getting items started over her feet and pulling up clothing in standing. Toilet Transfer: Moderate assistance, +2 for safety/equipment, Ambulation, BSC, RW, Cueing for safety, Cueing for sequencing Toilet Transfer Details (indicate cue type and reason): Simulated by sit to stand from EOB with functional mobility in room. Functional mobility during ADLs: Moderate assistance, +2 for safety/equipment, Rolling walker, Cueing for safety, Cueing for sequencing General ADL Comments: Discussed need for post acute rehab; pt and husband are agreeable and hopeful for CIR placement.    Mobility  Overal bed mobility: Needs Assistance Bed Mobility: Supine to Sit, Sit to Supine Supine to sit: Mod assist Sit to supine: Min guard General bed mobility comments: Assist for trunk elevation from supine to sit and scooting hips out toward EOB. Cues for sequencing and  hand placement.Took incr time for pt to scoot to EOB and did best with reciprocal scooting. Had to have reminders to place hands correctly as to not injure them as they want to invert and pronate but pt cannot coordinate them.  Does best with pressure applied over hands hand over hand for movement.       Transfers  Overall transfer level: Needs assistance Equipment used: Rolling walker (2 wheeled) Transfers: Sit to/from Stand Sit to Stand: Mod assist, +2 safety/equipment Stand pivot transfers: Min guard General transfer comment: Mod assist to boost up from EOB and couch. Cues for hand placement and technique as pt with athetoid and dysmetric movements making transitions difficult and unsteady.     Ambulation / Gait / Stairs / Wheelchair Mobility  Ambulation/Gait Ambulation/Gait assistance: Mod assist, +2 physical assistance Ambulation Distance (Feet): 40 Feet Assistive device: Rolling walker (2 wheeled) Gait Pattern/deviations: Step-through pattern, Decreased stride length, Decreased dorsiflexion - left, Trunk flexed, Decreased step length - right, Decreased step length - left,  Ataxic, Scissoring, Staggering left, Drifts right/left, Staggering right, Narrow base of support General Gait Details: Pt with slow, grossly unsteady gait with assistance needed to steady and cues to separate feet to widen BOS; cues for bilat heel strike.  Pt at times scissoring needing cues to separate feet.  Posterior lean which pt could correct with cues but could not maintain postural stability for any length of time.  Pt also had difficulty keeping her right UE as well as Left UE on RW due to dysmetria (right worse then left).  Poor postural stability overall. Fatigues and as she fatigues her posture and poor coordination worsens.   Gait velocity: decreased Gait velocity interpretation: Below normal speed for age/gender    Posture / Balance Dynamic Sitting Balance Sitting balance - Comments: can sit EOB for up to 5  min but cannot use UEs effectively to assist her with sitting with continued athetoid movements when using UEs.  Difficult for pt to use hands functionally due to this.  Does best if she brings hands together to grasp objects.   Balance Overall balance assessment: Needs assistance Sitting-balance support: Feet supported, Bilateral upper extremity supported Sitting balance-Leahy Scale: Fair Sitting balance - Comments: can sit EOB for up to 5 min but cannot use UEs effectively to assist her with sitting with continued athetoid movements when using UEs.  Difficult for pt to use hands functionally due to this.  Does best if she brings hands together to grasp objects.   Standing balance support: Bilateral upper extremity supported Standing balance-Leahy Scale: Poor Standing balance comment: walker and mod assist  for static standing with significant posterior lean limiting pt stability. High level balance activites: Turns High Level Balance Comments: mod assist with turns with cues to stay close to RW.  Ataxia made it difficult as well.     Special needs/care consideration BiPAP/CPAP  N/a CPM  N/a Continuous Drip IV  yes Dialysis  N/a Life Vest  N/a Oxygen  N/a Special Bed  N/a Trach Size  N/a Wound Vac (area)  N/a Skin rash to back. Knee, thigh, groin  and leg, some blisters to back and leg                         Bowel mgmt: 2/4 continent Bladder mgmt: wears briefs and som incontinence Diabetic mgmt  N/a Was seeing Dr. Nelva Bush and Dr. Rolena Infante for bulging disc and receiving pain med injection with Dr. Nelva Bush that she would like to inquired into during CIR stay. Pt reports wash clothes , towels, etc are painful and rough to her skin since this diagnosis .    Previous Home Environment Living Arrangements: Spouse/significant other  Lives With: Spouse Available Help at Discharge: Family, Available 24 hours/day Type of Home: House Home Layout: Two level, Able to live on main level with  bedroom/bathroom Home Access: Stairs to enter Entrance Stairs-Rails: None Entrance Stairs-Number of Steps: 2 Bathroom Shower/Tub: Tub/shower unit, Air cabin crew Accessibility: No (down stair sbathroom small for pt must enter sideways with u) Home Care Services: Yes Type of Home Care Services: Home PT, [facility] (if known): ?[facility]  Discharge Living Setting Plans for Discharge Living Setting: Patient's home, Lives with (comment) Type of Home at Discharge: House Discharge Home Layout: Able to live on main level with bedroom/bathroom, Two level Discharge Home Access: Stairs to enter Entrance Stairs-Rails: None Entrance Stairs-Number of Steps: 2 Discharge Bathroom Shower/Tub: Tub/shower unit, Curtain Discharge  Bathroom Toilet: Standard Discharge Bathroom Accessibility: No (has to walk in sideways with use of RW) Does the patient have any problems obtaining your medications?: No  Social/Family/Support Systems Patient Roles: Spouse, Parent Contact Information: Annie Main, spouse Anticipated Caregiver: spouse, children and grandchildren Anticipated Caregiver's Contact Information: see above Ability/Limitations of Caregiver: spouse can provide min assist Caregiver Availability: 24/7 Discharge Plan Discussed with Primary Caregiver: Yes Is Caregiver In Agreement with Plan?: Yes Does Caregiver/Family have Issues with Lodging/Transportation while Pt is in Rehab?: No  Goals/Additional Needs Patient/Family Goal for Rehab: mod I to supervision with PT and OT Expected length of stay: ELOS 13-18 days Pt/Family Agrees to Admission and willing to participate: Yes Program Orientation Provided & Reviewed with Pt/Caregiver Including Roles  & Responsibilities: Yes  Decrease burden of Care through IP rehab admission: n/a  Possible need for SNF placement upon discharge:not anticipated  Patient Condition: This patient's medical and functional status has  changed since the consult dated: 10/05/2016 in which the Rehabilitation Physician determined and documented that the patient's condition is appropriate for intensive rehabilitative care in an inpatient rehabilitation facility. See "History of Present Illness" (above) for medical update. Functional changes are: overall mod assist. Patient's medical and functional status update has been discussed with the Rehabilitation physician and patient remains appropriate for inpatient rehabilitation. Will admit to inpatient rehab today.  Preadmission Screen Completed By:  Cleatrice Burke, 10/08/2016 12:22 PM ______________________________________________________________________   Discussed status with Dr. Posey Pronto on 10/08/16 at  1223 and received telephone approval for admission today.  Admission Coordinator:  Cleatrice Burke, time [id number] Date 10/08/2016

## 2016-10-06 LAB — HEPATITIS C ANTIBODY (REFLEX)

## 2016-10-06 LAB — HCV COMMENT:

## 2016-10-06 LAB — HEPATITIS B SURFACE ANTIGEN: HEP B S AG: NEGATIVE

## 2016-10-06 NOTE — Progress Notes (Signed)
PROGRESS NOTE    Katelyn Lamb  [id number] DOB: 1946-06-04 DOA: 10/02/2016 PCP: [name], MD   Brief Narrative:  71 y.o. female with medical history significant of anxiety and chronic back pain; presents with a five-day history of progressively worsening weakness. Just recently hospitalized here at [facility] 1/18-1/24 for HSV encephalitis and myelitis discharged home with PICC to complete  21 day course of IV acyclovir   Assessment & Plan:   Active Problems:   Myelitis due to herpes simplex [facility]): CERVICAL AND THORACIC - Plan is to continue acyclovir and IV methylprednisolone. - Neurology on board and managing - ID consulted     Thrombocytopenia ([place]) - Stable and improved from last check    Anxiety - Stable    DVT prophylaxis: Lovenox Code Status: Full Family Communication: None at bedside Disposition Plan: Once cleared for discharge by specialist   Consultants:   ID  Neurology   Procedures: None   Antimicrobials: Acyclovir   Subjective: The patient has no new complaints. Would like her diet changed to regular diet.  Objective: Vitals:   10/04/16 2154 10/05/16 0618 10/05/16 1943 10/06/16 0405  BP: (!) 98/54 130/72 108/60 110/62  Pulse: 74 100 69 68  Resp: 16 16 17 18   Temp: 98.6 F (37 C) 98 F (36.7 C) 97.8 F (36.6 C) 97.9 F (36.6 C)  TempSrc: Oral  Oral Oral  SpO2: 96% 93% 98% 99%  Weight:      Height:        Intake/Output Summary (Last 24 hours) at 10/06/16 1609 Last data filed at 10/06/16 1400  Gross per 24 hour  Intake             3144 ml  Output                0 ml  Net             3144 ml   Filed Weights   10/02/16 1943  Weight: 63.5 kg (140 lb)    Examination:  General exam: Appears calm and comfortable , In no acute distress Respiratory system: Clear to auscultation. Respiratory effort normal., No wheezes Cardiovascular system: S1 & S2 heard, RRR. No JVD, murmurs, rubs, gallops or clicks. No pedal  edema. Gastrointestinal system: Abdomen is nondistended, soft and nontender. No organomegaly or masses felt. Normal bowel sounds heard. Central nervous system: Alert and oriented. No facial asymmetry Extremities: No cyanosis Skin: No rashes, lesions or ulcers, limited exam Psychiatry: Judgement and insight appear normal. Mood & affect appropriate.   Data Reviewed: I have personally reviewed following labs and imaging studies  CBC:  Recent Labs Lab 10/02/16 2153 10/03/16 0719  WBC 8.2 6.3  NEUTROABS 4.7 5.7  HGB 12.9 13.3  HCT 39.7 40.2  MCV 96.8 94.4  PLT 127* [id number]*   Basic Metabolic Panel:  Recent Labs Lab 10/02/16 2153  NA 136  K 3.8  CL 98*  CO2 25  GLUCOSE 126*  BUN 12  CREATININE 0.86  CALCIUM 8.5*   GFR: Estimated Creatinine Clearance: 58.6 mL/min (by C-G formula based on SCr of 0.86 mg/dL). Liver Function Tests:  Recent Labs Lab 10/02/16 2153  AST 19  ALT 22  ALKPHOS 69  BILITOT 0.4  PROT 5.2*  ALBUMIN 2.7*   No results for input(s): LIPASE, AMYLASE in the last 168 hours. No results for input(s): AMMONIA in the last 168 hours. Coagulation Profile: No results for input(s): INR, PROTIME in the last 168 hours. Cardiac Enzymes: No results  for input(s): CKTOTAL, CKMB, CKMBINDEX, TROPONINI in the last 168 hours. BNP (last 3 results) No results for input(s): PROBNP in the last 8760 hours. HbA1C: No results for input(s): HGBA1C in the last 72 hours. CBG: No results for input(s): GLUCAP in the last 168 hours. Lipid Profile: No results for input(s): CHOL, HDL, LDLCALC, TRIG, CHOLHDL, LDLDIRECT in the last 72 hours. Thyroid Function Tests: No results for input(s): TSH, T4TOTAL, FREET4, T3FREE, THYROIDAB in the last 72 hours. Anemia Panel: No results for input(s): VITAMINB12, FOLATE, FERRITIN, TIBC, IRON, RETICCTPCT in the last 72 hours. Sepsis Labs: No results for input(s): PROCALCITON, LATICACIDVEN in the last 168 hours.  No results found for this or  any previous visit (from the past 240 hour(s)).   Radiology Studies: No results found.  Scheduled Meds: . acyclovir (ZOVIRAX) </= 700 mg IVPB  10 mg/kg Intravenous Q8H  . enoxaparin (LOVENOX) injection  40 mg Subcutaneous Q24H  . gabapentin  100 mg Oral TID  . pantoprazole  40 mg Oral Daily  . [START ON 10/10/2016] predniSONE  10 mg Oral Once  . [START ON 10/09/2016] predniSONE  20 mg Oral Once  . [START ON 10/08/2016] predniSONE  30 mg Oral Once  . [START ON 10/07/2016] predniSONE  40 mg Oral Once   Continuous Infusions: . sodium chloride 75 mL/hr at 10/06/16 0354     LOS: 3 days    Time spent:  > 35 minutes  Velvet Bathe, MD [facility] Pager [phone]  If 7PM-7AM, please contact night-coverage [email or web address] Password [email or web address] 10/06/2016, 4:09 PM

## 2016-10-07 NOTE — Progress Notes (Signed)
PROGRESS NOTE    Katelyn Lamb  [id number] DOB: May 28, 1946 DOA: 10/02/2016 PCP: [name], MD   Brief Narrative:  71 y.o. female with medical history significant of anxiety and chronic back pain; presents with a five-day history of progressively worsening weakness. 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 - Neurology on board and managing - ID consulted    Thrombocytopenia ([place]) - Stable and improved from last check    Anxiety - Stable    DVT prophylaxis: Lovenox Code Status: Full Family Communication: None at bedside Disposition Plan: Once cleared for discharge by specialist   Consultants:   ID  Neurology   Procedures: None   Antimicrobials: Acyclovir   Subjective: The patient has no new complaints. No new complaints.  Objective: Vitals:   10/05/16 1943 10/06/16 0405 10/06/16 2135 10/07/16 0502  BP: 108/60 110/62 (!) 105/58 121/62  Pulse: 69 68 (!) 55 (!) 52  Resp: 17 18 17 17   Temp: 97.8 F (36.6 C) 97.9 F (36.6 C) 97.8 F (36.6 C) 97.8 F (36.6 C)  TempSrc: Oral Oral Oral Oral  SpO2: 98% 99% 99% 99%  Weight:      Height:        Intake/Output Summary (Last 24 hours) at 10/07/16 1225 Last data filed at 10/07/16 [id number]  Gross per 24 hour  Intake          2672.75 ml  Output             1025 ml  Net          1647.75 ml   Filed Weights   10/02/16 1943  Weight: 63.5 kg (140 lb)    Examination:  General exam: Appears calm and comfortable , In no acute distress Respiratory system: Clear to auscultation. Respiratory effort normal., No wheezes Cardiovascular system: S1 & S2 heard, RRR. No JVD, murmurs, rubs, gallops or clicks. No pedal edema. Gastrointestinal  system: Abdomen is nondistended, soft and nontender. No organomegaly or masses felt. Normal bowel sounds heard. Central nervous system: Alert and oriented. No facial asymmetry Extremities: No cyanosis Skin: No rashes, lesions or ulcers, limited exam Psychiatry: Judgement and insight appear normal. Mood & affect appropriate.   Data Reviewed: I have personally reviewed following labs and imaging studies  CBC:  Recent Labs Lab 10/02/16 2153 10/03/16 0719  WBC 8.2 6.3  NEUTROABS 4.7 5.7  HGB 12.9 13.3  HCT 39.7 40.2  MCV 96.8 94.4  PLT 127* [id number]*   Basic Metabolic Panel:  Recent Labs Lab 10/02/16 2153  NA 136  K 3.8  CL 98*  CO2 25  GLUCOSE 126*  BUN 12  CREATININE 0.86  CALCIUM 8.5*   GFR: Estimated Creatinine Clearance: 57.8 mL/min (by C-G formula based on SCr of 0.86 mg/dL). Liver Function Tests:  Recent Labs Lab 10/02/16 2153  AST 19  ALT 22  ALKPHOS 69  BILITOT 0.4  PROT 5.2*  ALBUMIN 2.7*   No results for input(s): LIPASE, AMYLASE in the last 168 hours. No results for input(s): AMMONIA in the last 168 hours. Coagulation Profile: No results for input(s): INR, PROTIME in the last 168 hours. Cardiac Enzymes: No results for input(s): CKTOTAL, CKMB, CKMBINDEX, TROPONINI in the last 168 hours. BNP (last  3 results) No results for input(s): PROBNP in the last 8760 hours. HbA1C: No results for input(s): HGBA1C in the last 72 hours. CBG: No results for input(s): GLUCAP in the last 168 hours. Lipid Profile: No results for input(s): CHOL, HDL, LDLCALC, TRIG, CHOLHDL, LDLDIRECT in the last 72 hours. Thyroid Function Tests: No results for input(s): TSH, T4TOTAL, FREET4, T3FREE, THYROIDAB in the last 72 hours. Anemia Panel: No results for input(s): VITAMINB12, FOLATE, FERRITIN, TIBC, IRON, RETICCTPCT in the last 72 hours. Sepsis Labs: No results for input(s): PROCALCITON, LATICACIDVEN in the last 168 hours.  No results found for this or any previous visit (from  the past 240 hour(s)).   Radiology Studies: No results found.  Scheduled Meds: . acyclovir (ZOVIRAX) </= 700 mg IVPB  10 mg/kg Intravenous Q8H  . enoxaparin (LOVENOX) injection  40 mg Subcutaneous Q24H  . gabapentin  100 mg Oral TID  . pantoprazole  40 mg Oral Daily  . [START ON 10/10/2016] predniSONE  10 mg Oral Once  . [START ON 10/09/2016] predniSONE  20 mg Oral Once  . [START ON 10/08/2016] predniSONE  30 mg Oral Once   Continuous Infusions: . sodium chloride 75 mL/hr at 10/07/16 0418     LOS: 4 days    Time spent:  > 35 minutes  Velvet Bathe, MD [facility] Pager [phone]  If 7PM-7AM, please contact night-coverage [email or web address] Password [email or web address] 10/07/2016, 12:25 PM

## 2016-10-08 ENCOUNTER — Ambulatory Visit: Payer: PPO | Admitting: Neurology

## 2016-10-08 ENCOUNTER — Inpatient Hospital Stay (HOSPITAL_COMMUNITY)
Admission: RE | Admit: 2016-10-08 | Discharge: 2016-10-26 | DRG: 052 | Disposition: A | Discharge status: Home Health | Payer: PPO | Source: Intra-hospital | Attending: Physical Medicine & Rehabilitation | Admitting: Physical Medicine & Rehabilitation

## 2016-10-08 DIAGNOSIS — R4589 Other symptoms and signs involving emotional state: Secondary | ICD-10-CM

## 2016-10-08 DIAGNOSIS — G8222 Paraplegia, incomplete: Principal | ICD-10-CM

## 2016-10-08 DIAGNOSIS — G8929 Other chronic pain: Secondary | ICD-10-CM | POA: Diagnosis present

## 2016-10-08 DIAGNOSIS — Z79899 Other long term (current) drug therapy: Secondary | ICD-10-CM | POA: Diagnosis not present

## 2016-10-08 DIAGNOSIS — B0082 Herpes simplex myelitis: Secondary | ICD-10-CM | POA: Diagnosis present

## 2016-10-08 DIAGNOSIS — R300 Dysuria: Secondary | ICD-10-CM | POA: Diagnosis not present

## 2016-10-08 DIAGNOSIS — R109 Unspecified abdominal pain: Secondary | ICD-10-CM | POA: Diagnosis not present

## 2016-10-08 DIAGNOSIS — Z8249 Family history of ischemic heart disease and other diseases of the circulatory system: Secondary | ICD-10-CM

## 2016-10-08 DIAGNOSIS — K219 Gastro-esophageal reflux disease without esophagitis: Secondary | ICD-10-CM

## 2016-10-08 DIAGNOSIS — F418 Other specified anxiety disorders: Secondary | ICD-10-CM | POA: Diagnosis not present

## 2016-10-08 DIAGNOSIS — N390 Urinary tract infection, site not specified: Secondary | ICD-10-CM

## 2016-10-08 DIAGNOSIS — R339 Retention of urine, unspecified: Secondary | ICD-10-CM

## 2016-10-08 DIAGNOSIS — R159 Full incontinence of feces: Secondary | ICD-10-CM | POA: Diagnosis present

## 2016-10-08 DIAGNOSIS — R270 Ataxia, unspecified: Secondary | ICD-10-CM

## 2016-10-08 DIAGNOSIS — Z90710 Acquired absence of both cervix and uterus: Secondary | ICD-10-CM

## 2016-10-08 DIAGNOSIS — Z87891 Personal history of nicotine dependence: Secondary | ICD-10-CM | POA: Diagnosis not present

## 2016-10-08 DIAGNOSIS — F419 Anxiety disorder, unspecified: Secondary | ICD-10-CM | POA: Diagnosis not present

## 2016-10-08 DIAGNOSIS — I82432 Acute embolism and thrombosis of left popliteal vein: Secondary | ICD-10-CM

## 2016-10-08 DIAGNOSIS — G0491 Myelitis, unspecified: Secondary | ICD-10-CM | POA: Diagnosis not present

## 2016-10-08 DIAGNOSIS — Z888 Allergy status to other drugs, medicaments and biological substances status: Secondary | ICD-10-CM | POA: Diagnosis not present

## 2016-10-08 DIAGNOSIS — M549 Dorsalgia, unspecified: Secondary | ICD-10-CM | POA: Diagnosis not present

## 2016-10-08 DIAGNOSIS — D62 Acute posthemorrhagic anemia: Secondary | ICD-10-CM

## 2016-10-08 DIAGNOSIS — Z880 Allergy status to penicillin: Secondary | ICD-10-CM | POA: Diagnosis not present

## 2016-10-08 DIAGNOSIS — B029 Zoster without complications: Secondary | ICD-10-CM | POA: Diagnosis not present

## 2016-10-08 DIAGNOSIS — R2689 Other abnormalities of gait and mobility: Secondary | ICD-10-CM | POA: Diagnosis present

## 2016-10-08 DIAGNOSIS — Z881 Allergy status to other antibiotic agents status: Secondary | ICD-10-CM | POA: Diagnosis not present

## 2016-10-08 DIAGNOSIS — K5901 Slow transit constipation: Secondary | ICD-10-CM

## 2016-10-08 DIAGNOSIS — I959 Hypotension, unspecified: Secondary | ICD-10-CM

## 2016-10-08 DIAGNOSIS — N319 Neuromuscular dysfunction of bladder, unspecified: Secondary | ICD-10-CM

## 2016-10-08 DIAGNOSIS — R278 Other lack of coordination: Secondary | ICD-10-CM | POA: Diagnosis present

## 2016-10-08 DIAGNOSIS — G0490 Encephalitis and encephalomyelitis, unspecified: Secondary | ICD-10-CM | POA: Diagnosis present

## 2016-10-08 DIAGNOSIS — I952 Hypotension due to drugs: Secondary | ICD-10-CM

## 2016-10-08 DIAGNOSIS — M62838 Other muscle spasm: Secondary | ICD-10-CM | POA: Diagnosis present

## 2016-10-08 DIAGNOSIS — F329 Major depressive disorder, single episode, unspecified: Secondary | ICD-10-CM | POA: Diagnosis not present

## 2016-10-08 DIAGNOSIS — M7989 Other specified soft tissue disorders: Secondary | ICD-10-CM | POA: Diagnosis not present

## 2016-10-08 DIAGNOSIS — K5903 Drug induced constipation: Secondary | ICD-10-CM

## 2016-10-08 DIAGNOSIS — M792 Neuralgia and neuritis, unspecified: Secondary | ICD-10-CM

## 2016-10-08 LAB — CREATININE, SERUM: Creatinine, Ser: 0.74 mg/dL (ref 0.44–1.00)

## 2016-10-08 LAB — CBC
HEMATOCRIT: 35.2 % — AB (ref 36.0–46.0)
HEMOGLOBIN: 11.7 g/dL — AB (ref 12.0–15.0)
MCH: 31 pg (ref 26.0–34.0)
MCHC: 33.2 g/dL (ref 30.0–36.0)
MCV: 93.4 fL (ref 78.0–100.0)
Platelets: 154 10*3/uL (ref 150–400)
RBC: 3.77 MIL/uL — ABNORMAL LOW (ref 3.87–5.11)
RDW: 13.2 % (ref 11.5–15.5)
WBC: 8.7 10*3/uL (ref 4.0–10.5)

## 2016-10-08 MED ORDER — SORBITOL 70 % SOLN: 30.0000 mL | Freq: Every day | Status: DC | PRN

## 2016-10-08 MED ORDER — ENOXAPARIN SODIUM 40 MG/0.4ML ~~LOC~~ SOLN: 40.0000 mg | SUBCUTANEOUS | Status: DC

## 2016-10-08 MED ORDER — PREDNISONE 20 MG PO TABS
20.0000 mg | ORAL_TABLET | Freq: Once | ORAL | Status: AC
  Administered 2016-10-09: 20 mg via ORAL
  Filled 2016-10-08: qty 1

## 2016-10-08 MED ORDER — DEXTROSE 5 % IV SOLN
10.0000 mg/kg | Freq: Three times a day (TID) | INTRAVENOUS | Status: AC
  Administered 2016-10-08 – 2016-10-12 (×13): 650 mg via INTRAVENOUS
  Filled 2016-10-08 (×16): qty 13

## 2016-10-08 MED ORDER — ALPRAZOLAM 0.5 MG PO TABS
1.0000 mg | ORAL_TABLET | Freq: Every day | ORAL | Status: DC | PRN
  Administered 2016-10-09 – 2016-10-26 (×6): 1 mg via ORAL
  Filled 2016-10-08 (×6): qty 2

## 2016-10-08 MED ORDER — ENSURE ENLIVE PO LIQD
237.0000 mL | Freq: Two times a day (BID) | ORAL | Status: DC
  Administered 2016-10-09 (×2): 237 mL via ORAL

## 2016-10-08 MED ORDER — GABAPENTIN 100 MG PO CAPS
100.0000 mg | ORAL_CAPSULE | Freq: Three times a day (TID) | ORAL | Status: DC
  Administered 2016-10-08 – 2016-10-16 (×23): 100 mg via ORAL
  Filled 2016-10-08 (×23): qty 1

## 2016-10-08 MED ORDER — DIAZEPAM 5 MG PO TABS
5.0000 mg | ORAL_TABLET | Freq: Two times a day (BID) | ORAL | Status: DC | PRN
  Administered 2016-10-09: 5 mg via ORAL
  Filled 2016-10-08: qty 1

## 2016-10-08 MED ORDER — ALBUTEROL SULFATE (2.5 MG/3ML) 0.083% IN NEBU: 2.5000 mg | INHALATION_SOLUTION | RESPIRATORY_TRACT | Status: DC | PRN

## 2016-10-08 MED ORDER — PREDNISONE 5 MG PO TABS
10.0000 mg | ORAL_TABLET | Freq: Once | ORAL | Status: AC
  Administered 2016-10-10: 10 mg via ORAL
  Filled 2016-10-08: qty 2

## 2016-10-08 MED ORDER — SODIUM CHLORIDE 0.9% FLUSH
10.0000 mL | INTRAVENOUS | Status: DC | PRN
Start: 2016-10-08 — End: 2016-10-23
  Administered 2016-10-09 – 2016-10-18 (×6): 10 mL
  Filled 2016-10-08 (×6): qty 40

## 2016-10-08 MED ORDER — ENOXAPARIN SODIUM 40 MG/0.4ML ~~LOC~~ SOLN
40.0000 mg | SUBCUTANEOUS | Status: DC
  Administered 2016-10-09: 40 mg via SUBCUTANEOUS
  Filled 2016-10-08: qty 0.4

## 2016-10-08 MED ORDER — PANTOPRAZOLE SODIUM 40 MG PO TBEC
40.0000 mg | DELAYED_RELEASE_TABLET | Freq: Every day | ORAL | Status: DC
  Administered 2016-10-09 – 2016-10-26 (×18): 40 mg via ORAL
  Filled 2016-10-08 (×18): qty 1

## 2016-10-08 MED ORDER — ACETAMINOPHEN 325 MG PO TABS
650.0000 mg | ORAL_TABLET | Freq: Four times a day (QID) | ORAL | Status: DC | PRN
  Filled 2016-10-08 (×2): qty 2

## 2016-10-08 MED ORDER — HYDROCODONE-ACETAMINOPHEN 10-325 MG PO TABS
1.0000 | ORAL_TABLET | ORAL | Status: DC | PRN
  Administered 2016-10-08 – 2016-10-09 (×3): 1 via ORAL
  Filled 2016-10-08 (×3): qty 1

## 2016-10-08 MED ORDER — ONDANSETRON HCL 4 MG PO TABS
4.0000 mg | ORAL_TABLET | Freq: Four times a day (QID) | ORAL | Status: DC | PRN
  Administered 2016-10-20: 4 mg via ORAL
  Filled 2016-10-08: qty 1

## 2016-10-08 MED ORDER — ONDANSETRON HCL 4 MG/2ML IJ SOLN: 4.0000 mg | Freq: Four times a day (QID) | INTRAMUSCULAR | Status: DC | PRN

## 2016-10-08 MED ORDER — ACETAMINOPHEN 650 MG RE SUPP: 650.0000 mg | Freq: Four times a day (QID) | RECTAL | Status: DC | PRN

## 2016-10-08 NOTE — Progress Notes (Signed)
Physical Therapy Treatment Patient Details Name: Katelyn Lamb MRN: [id number] DOB: [birth year] Today's Date: 10/08/2016    History of Present Illness 71 y.o.femalewith medical history significant of anxiety andchronic back pain; presents with a five-day history of progressively worsening weakness. Just recently hospitalized here at [facility] 1/18-1/24for HSV encephalitis and myelitis. Husband notes a gradual decline the patient's overall functional status including decreased ability to walk, involuntary hand movements,and loss of coordination. Patient reports associated symptoms of numbness from the nipple line down, increased work of breathing,and feeling as though a band is wrapped around waist.    PT Comments    Pt admitted with above diagnosis. Pt currently with functional limitations due to balance and endurance deficits as well as coordination deficits.  Pt was able to incr distance today however still requiring +2 mod assist for safety due to poor postural stability with posterior lean in standing and ambulation.  Pt is continuing to use her hands as much as possible and she can grasp an  Object with both hands together.  Her coordination is still impaired in all 4 extremities.  Pt is highly motivated and would be a great candidate for Rehab.  Will continue to follow acutely. Pt will benefit from skilled PT to increase their independence and safety with mobility to allow discharge to the venue listed below.    Follow Up Recommendations  CIR;Supervision/Assistance - 24 hour     Equipment Recommendations  None recommended by PT    Recommendations for Other Services Rehab consult     Precautions / Restrictions Precautions Precautions: Fall Restrictions Weight Bearing Restrictions: No    Mobility  Bed Mobility Overal bed mobility: Needs Assistance Bed Mobility: Supine to Sit;Sit to Supine     Supine to sit: Mod assist     General bed mobility comments: Assist for trunk  elevation from supine to sit and scooting hips out toward EOB. Cues for sequencing and hand placement.Took incr time for pt to scoot to EOB and did best with reciprocal scooting. Had to have reminders to place hands correctly as to not injure them as they want to invert and pronate but pt cannot coordinate them.  Does best with pressure applied over hands hand over hand for movement.     Transfers Overall transfer level: Needs assistance Equipment used: Rolling walker (2 wheeled) Transfers: Sit to/from Stand Sit to Stand: Mod assist;+2 safety/equipment Stand pivot transfers: Min guard       General transfer comment: Mod assist to boost up from EOB and couch. Cues for hand placement and technique as pt with athetoid and dysmetric movements making transitions difficult and unsteady.   Ambulation/Gait Ambulation/Gait assistance: Mod assist;+2 physical assistance Ambulation Distance (Feet): 40 Feet Assistive device: Rolling walker (2 wheeled) Gait Pattern/deviations: Step-through pattern;Decreased stride length;Decreased dorsiflexion - left;Trunk flexed;Decreased step length - right;Decreased step length - left;Ataxic;Scissoring;Staggering left;Drifts right/left;Staggering right;Narrow base of support Gait velocity: decreased Gait velocity interpretation: Below normal speed for age/gender General Gait Details: Pt with slow, grossly unsteady gait with assistance needed to steady and cues to separate feet to widen BOS; cues for bilat heel strike.  Pt at times scissoring needing cues to separate feet.  Posterior lean which pt could correct with cues but could not maintain postural stability for any length of time.  Pt also had difficulty keeping her right UE as well as Left UE on RW due to dysmetria (right worse then left).  Poor postural stability overall. Fatigues and as she fatigues her posture and  poor coordination worsens.     Stairs            Wheelchair Mobility    Modified Rankin  (Stroke Patients Only)       Balance Overall balance assessment: Needs assistance Sitting-balance support: Feet supported;Bilateral upper extremity supported Sitting balance-Leahy Scale: Fair Sitting balance - Comments: can sit EOB for up to 5 min but cannot use UEs effectively to assist her with sitting with continued athetoid movements when using UEs.  Difficult for pt to use hands functionally due to this.  Does best if she brings hands together to grasp objects.     Standing balance support: Bilateral upper extremity supported Standing balance-Leahy Scale: Poor Standing balance comment: walker and mod assist  for static standing with significant posterior lean limiting pt stability.             High level balance activites: Turns High Level Balance Comments: mod assist with turns with cues to stay close to RW.  Ataxia made it difficult as well.     Cognition Arousal/Alertness: Awake/alert Behavior During Therapy: Anxious;WFL for tasks assessed/performed Overall Cognitive Status: Within Functional Limits for tasks assessed                      Exercises General Exercises - Lower Extremity Ankle Circles/Pumps: AROM;Both;15 reps Long Arc Quad: AROM;Both;Seated;10 reps Hip Flexion/Marching: AROM;Both;10 reps;Seated    General Comments        Pertinent Vitals/Pain Pain Assessment: Faces Faces Pain Scale: Hurts little more Pain Location: nerve pain bil UE and LEs Pain Descriptors / Indicators: Tingling;Numbness;Burning Pain Intervention(s): Limited activity within patient's tolerance;Monitored during session;Repositioned  VSS  Home Living                      Prior Function            PT Goals (current goals can now be found in the care plan section) Acute Rehab PT Goals Patient Stated Goal: return to being independent Progress towards PT goals: Progressing toward goals    Frequency    Min 3X/week      PT Plan Current plan remains  appropriate    Co-evaluation             End of Session Equipment Utilized During Treatment: Gait belt Activity Tolerance: Patient limited by fatigue Patient left: with call bell/phone within reach;in chair     Time: 1035-1055 PT Time Calculation (min) (ACUTE ONLY): 20 min  Charges:  $Gait Training: 8-22 mins                    G Codes:      Denice Paradise 06-Nov-2016, 11:31 AM Amanda [facility] Acute Rehabilitation [phone] (pager)

## 2016-10-08 NOTE — H&P (Signed)
Physical Medicine and Rehabilitation Admission H&P    Chief Complaint  Patient presents with  . Tingling  : HPI: Katelyn Lamb is a 71 y.o. right handed female with history of anxiety and chronic back pain. Presented with a five-day history of progressive worsening weakness of lower extremities. Just recently hospitalized at [facility] 1/18-1/24 for HSV encephalitis and myelitis discharged to home with a PICC line to complete 21 day course of IV acyclovir. Per chart review, patient lives with spouse needed minimal assist for ambulation with rolling walker. Minimal assist to stand from a low chair. She needed some assistance with dressing. 2 level home with bedroom on first floor. After being discharged to home she was not continued on high dose steroids previously given during her hospitalization. Husband reported a gradual decline overall functional status with associated symptoms of numbness from the nipple line down as well as involuntary hand movements. Noted bladder and bowel incontinence. Presented 10/03/2016 due to her increasing weakness. MRI of the brain reviewed, no new intracranial abnormality. MRI of the spine showed improved appearance of the paramedian frontal and cingulate gyrus signal abnormality with resolved restricted diffusion and decreased enhancement. Decreased patchy T2 signal abnormality and expansion throughout the cervical and thoracic cord. Cord enhancement from C2-C4 had increased. Neurology follow-up placed on intravenous Solu-Medrol and transitioned to PO with taper as directed. Infectious disease follow-up advised to complete 21 day course of  IV acyclovir . Subcutaneous Lovenox for DVT prophylaxis. Tolerating a regular diet. Physical and occupational therapy evaluations completed 10/04/2016 with recommendations of physical medicine rehabilitation consult. Patient was admitted for a comprehensive rehabilitation program  Review of Systems  Constitutional:  Negative for chills and fever.  HENT: Negative for hearing loss and tinnitus.   Eyes: Negative for blurred vision and discharge.  Respiratory: Negative for cough and shortness of breath.   Cardiovascular: Positive for leg swelling. Negative for chest pain and palpitations.  Gastrointestinal: Positive for constipation. Negative for abdominal pain, nausea and vomiting.  Genitourinary: Negative for flank pain and hematuria.       Bladder incontinence  Musculoskeletal: Positive for back pain.  Skin: Negative for rash.  Neurological: Positive for sensory change, focal weakness and weakness. Negative for seizures and loss of consciousness.  Psychiatric/Behavioral:       Anxiety  All other systems reviewed and are negative.  Past Medical History:  Diagnosis Date  . Anxiety   . Back pain   . 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 ABDOMINAL HYSTERECTOMY    . BLADDER REPAIR    . CESAREAN SECTION    . TUBAL LIGATION     Family History  Problem Relation Age of Onset  . Hypertension Mother    Social History:  reports that she has quit smoking. She has never used smokeless tobacco. She reports that she does not drink alcohol or use drugs. Allergies:  Allergies  Allergen Reactions  . Demerol [Meperidine] Other (See Comments)    Hallucinations  . Amoxicillin-Pot Clavulanate Other (See Comments)  . Penicillins Other (See Comments)   Medications Prior to Admission  Medication Sig Dispense Refill  . acyclovir 650 mg in dextrose 5 % 100 mL Inject 650 mg into the vein every 8 (eight) hours. TAKE FOR 16 MORE DAYS. 31200 mg 0  . ALPRAZolam (XANAX) 1 MG tablet Take 1 mg by mouth at bedtime as needed for anxiety or sleep.     Marland Kitchen  diazepam (VALIUM) 5 MG tablet Take 1 tablet (5 mg total) by mouth 2 (two) times daily. (Patient taking differently: Take 5 mg by mouth every 12 (twelve) hours as needed for muscle spasms. ) 10 tablet 0  . gabapentin  (NEURONTIN) 100 MG capsule Take 100 mg by mouth 3 (three) times daily.    Marland Kitchen HYDROcodone-acetaminophen (NORCO) 10-325 MG per tablet Take 1 tablet by mouth every 8 (eight) hours as needed for moderate pain. 30 tablet 0  . calcium carbonate (OS-CAL) 600 MG TABS tablet Take 600 mg by mouth 2 (two) times daily with a meal.    . cholecalciferol (VITAMIN D) 1000 units tablet Take 1,000 Units by mouth daily.    . diphenhydramine-acetaminophen (TYLENOL PM) 25-500 MG TABS tablet Take 1 tablet by mouth at bedtime as needed (sleep/pain).    Marland Kitchen ketorolac (TORADOL) 10 MG tablet Take 1 tablet (10 mg total) by mouth every 6 (six) hours as needed. (Patient not taking: Reported on 10/02/2016) 20 tablet 0  . omega-3 acid ethyl esters (LOVAZA) 1 G capsule Take by mouth 2 (two) times daily.      Home: Home Living Family/patient expects to be discharged to:: Private residence Living Arrangements: Spouse/significant other Available Help at Discharge: Family, Available 24 hours/day Type of Home: House Home Access: Stairs to enter [employer] of Steps: 2 Entrance Stairs-Rails: None Home Layout: Two level, Able to live on main level with bedroom/bathroom Bathroom Shower/Tub: Tub/shower unit, Architectural technologist: Standard Bathroom Accessibility: No (down stair sbathroom small for pt must enter sideways with u) Home Equipment: Bedside commode, Walker - 2 wheels, Adaptive equipment (gait belt) Adaptive Equipment: Reacher  Lives With: Spouse   Functional History: Prior Function Level of Independence:  (was totally independent prior to this disease process) Gait / Transfers Assistance Needed: min assist for ambulation with RW.  min asssit to stand from lower chair.  ADL's / Homemaking Assistance Needed: Sponge bathed PTA per pt due to PICC -someone helped with all of it; Assist with dressing; A to feed pt as well.   Comments: [place] in the bathroom a few times per pt  Functional Status:  Mobility: Bed  Mobility Overal bed mobility: Needs Assistance Bed Mobility: Supine to Sit, Sit to Supine Supine to sit: Mod assist Sit to supine: Min guard General bed mobility comments: Assist for trunk elevation from supine to sit and scooting hips out toward EOB. Cues for sequencing and hand placement.Took incr time for pt to scoot to EOB and did best with reciprocal scooting. Had to have reminders to place hands correctly as to not injure them.  Transfers Overall transfer level: Needs assistance Equipment used: Rolling walker (2 wheeled) Transfers: Sit to/from Stand Sit to Stand: Mod assist, +2 safety/equipment General transfer comment: Mod assist to boost up from EOB and couch. Cues for hand placement and technique as pt with athetoid and dysmetric movements making transitions difficult and unsteady.  Ambulation/Gait Ambulation/Gait assistance: Mod assist, +2 physical assistance Ambulation Distance (Feet): 20 Feet (10 feet x 2) Assistive device: Rolling walker (2 wheeled) Gait Pattern/deviations: Step-through pattern, Decreased stride length, Decreased dorsiflexion - left, Trunk flexed General Gait Details: slow, grossly unsteady gait with assistance needed to steady and cues to separate feet to widen BOS; cues for bilat heel strike.  Pt also had difficulty keeping her right UE on RW due to dysmetria.  Poor postural stability overall.  Husband present and said gait got progressively worse.  Gait velocity: decreased Gait velocity interpretation: Below normal  speed for age/gender    ADL: ADL Overall ADL's : Needs assistance/impaired Eating/Feeding: Set up, With adaptive utensils, Sitting Eating/Feeding Details (indicate cue type and reason): Pt with orange foam in room to use as built up handle for utensils. Pt reports she has just been using foam for hand strengthening. Educated pt and husband on use of foam for built up handle; pt able to demo use with spoon to mouth. Grooming: Minimal assistance,  Sitting Upper Body Bathing: Minimal assistance, Sitting Lower Body Bathing: Moderate assistance, Sit to/from stand Upper Body Dressing : Minimal assistance, Sitting Upper Body Dressing Details (indicate cue type and reason): to don/doff hospital gown Lower Body Dressing: Moderate assistance, Sit to/from stand Lower Body Dressing Details (indicate cue type and reason): Pt able to adjust socks in sitting. Anticipate she would have difficulty getting items started over her feet and pulling up clothing in standing. Toilet Transfer: Moderate assistance, +2 for safety/equipment, Ambulation, BSC, RW, Cueing for safety, Cueing for sequencing Toilet Transfer Details (indicate cue type and reason): Simulated by sit to stand from EOB with functional mobility in room. Functional mobility during ADLs: Moderate assistance, +2 for safety/equipment, Rolling walker, Cueing for safety, Cueing for sequencing General ADL Comments: Discussed need for post acute rehab; pt and husband are agreeable and hopeful for CIR placement.  Cognition: Cognition Overall Cognitive Status: Within Functional Limits for tasks assessed Orientation Level: Oriented X4 Cognition Arousal/Alertness: Awake/alert Behavior During Therapy: Anxious, WFL for tasks assessed/performed Overall Cognitive Status: Within Functional Limits for tasks assessed  Physical Exam: Blood pressure 128/62, pulse (!) 57, temperature 98.2 F (36.8 C), temperature source Oral, resp. rate 17, height 5\' 6"  (1.676 m), weight 63.5 kg (140 lb), SpO2 97 %. Physical Exam  Vitals reviewed. Constitutional: She is oriented to person, place, and time. She appears well-developed.  Frail  HENT:  Head: Normocephalic and atraumatic.  Eyes: Conjunctivae and EOM are normal. Left eye exhibits no discharge.  Neck: Normal range of motion. Neck supple. No thyromegaly present.  Cardiovascular: Normal rate, regular rhythm and normal heart sounds.   Respiratory: Effort normal  and breath sounds normal. No respiratory distress. She has no wheezes.  GI: Soft. Bowel sounds are normal. She exhibits no distension.  Musculoskeletal: She exhibits no edema or tenderness.  Neurological: She is alert and oriented to person, place, and time.  Bilateral sensory loss distal to neck  Bilateral ataxia  Motor: B/l UE: 4-/5 proximal to distal  B/l LE: 4/5 proximal to distal DTRs symmetric  Skin: Skin is warm and dry.  Psychiatric: She has a normal mood and affect. Her behavior is normal.  Slightly anxious    Medical Problem List and Plan: 1.  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 Complete Zovirax as directed per infectious disease. Prednisone taper as directed 2.  DVT Prophylaxis/Anticoagulation: Subcutaneous Lovenox. Monitor platelet counts and any signs of bleeding. Check vascular study 3. Pain Management: Neurontin 100 mg 3 times a day, Valium 5 mg every 4 hours as needed muscle spasms, hydrocodone as needed 4. Mood: Xanax 1 mg daily as needed 5. Neuropsych: This patient is capable of making decisions on her own behalf. 6. Skin/Wound Care: Routine skin checks 7. Fluids/Electrolytes/Nutrition: Routine I&O with follow-up chemistries 8. GERD. Protonix 9. Constipation. Laxative assistance.   Post Admission Physician Evaluation: 1. Preadmission assessment reviewed and changes made below. 2. Functional deficits secondary  to paraplegia. 3. Patient is admitted to receive collaborative, interdisciplinary care between the physiatrist, rehab nursing staff,  and therapy team. 4. Patient's level of medical complexity and substantial therapy needs in context of that medical necessity cannot be provided at a lesser intensity of care such as a SNF. 5. Patient has experienced substantial functional loss from his/her baseline which was documented above under the "Functional History" and "Functional Status" headings.  Judging by the  patient's diagnosis, physical exam, and functional history, the patient has potential for functional progress which will result in measurable gains while on inpatient rehab.  These gains will be of substantial and practical use upon discharge  in facilitating mobility and self-care at the household level. 6. Physiatrist will provide 24 hour management of medical needs as well as oversight of the therapy plan/treatment and provide guidance as appropriate regarding the interaction of the two. 7. The Preadmission Screening has been reviewed and patient status is unchanged unless otherwise stated above. 8. 24 hour rehab nursing will assist with bladder management, bowel management, safety, skin/wound care, disease management, pain management and patient education  and help integrate therapy concepts, techniques,education, etc. 9. PT will assess and treat for/with: Lower extremity strength, range of motion, stamina, balance, functional mobility, safety, adaptive techniques and equipment, coping skills, pain control, education.   Goals are: Supervision/Min A. 10. OT will assess and treat for/with: ADL's, functional mobility, safety, upper extremity strength, adaptive techniques and equipment, ego support, and community reintegration.   Goals are: Supervision/Min A. Therapy may proceed with showering this patient. 11. Case Management and Social Worker will assess and treat for psychological issues and discharge planning. 12. Team conference will be held weekly to assess progress toward goals and to determine barriers to discharge. 13. Patient will receive at least 3 hours of therapy per day at least 5 days per week. 14. ELOS: 16-19 days.       15. Prognosis:  good  Delice Lesch, MD, Mellody Drown Cathlyn Parsons., PA-C 10/08/2016

## 2016-10-08 NOTE — Progress Notes (Signed)
Katelyn Gong, RN Rehab Admission Coordinator Signed Physical Medicine and Rehabilitation  PMR Pre-admission Date of Service: 10/05/2016 2:56 PM  Related encounter: ED to Hosp-Admission (Current) from 10/02/2016 in [place]       [] Hide copied text PMR Admission Coordinator Pre-Admission Assessment  Patient: Katelyn Lamb is an 71 y.o., female MRN: [id number] DOB: May 12, 1946 Height: 5\' 6"  (167.6 cm) Weight: 63.5 kg (140 lb)                                                                                                                                                  Insurance Information HMO:    PPO: yes     PCP:      IPA:      80/20:      OTHER: medicare advantage plan PRIMARY: [employer]    Policy#: [id number]      Subscriber: pt CM Name: Eula Listen     Phone#: [phone]     Fax#: EPIC Access Pre-Cert#: [id number] approved for 7 days      Employer: retired Benefits:  Phone #: [phone]     Name: 10/08/16 Eff. Date: 09/03/14     Deduct: none      Out of Pocket Max: $3400      Life Max: none CIR: $250 co pay per day days 1-6 then covers 100%      SNF: no co pay days 1-20; $150 co pay per day days 21-90 Outpatient: $15 co pay per visit     Co-Pay: visits per medical neccesity Home Health: $25 co pay per visit      Co-Pay: visits per medical neccesity DME: 80%     Co-Pay: 20% Providers: in network  SECONDARY: none        Medicaid Application Date:       Case Manager:  Disability Application Date:       Case Worker:   Emergency Contact Information        Contact Information    Name Relation Home Work Mobile   Evitt,Stephen Spouse [phone]  [phone]   Raynald Blend Daughter   [phone]   Whitman Hero Grandaughter   [phone]   Vinetta, Mcgauley   [phone]     Current Medical History  Patient Admitting Diagnosis: HSV-2 myelitis, 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 Presented with a five-day history of progressive worsening weakness of lower extremities. Just recently hospitalized at [facility] 1/18-1/24 for HSV encephalitis and myelitis discharged to home with a PICC line to complete 21 day course of IV acyclovir.  After being discharged to home she was not continued on high dose steroids previously given during her hospitalization. Husband reported a gradual decline overall functional  status with associated symptoms of numbness from the nipple line down as well as involuntary hand movements. Noted bladder and bowel incontinence. Presented 10/03/2016 due to her increasing weakness. MRI of the brain showed no new intracranial abnormality. MRI of the spine showed improved appearance of the paramedian frontal and cingulate gyrus signal abnormality with resolved restricted diffusion and decreased enhancement. Decreased patchy T2 signal abnormality and expansion throughout the cervical and thoracic cord. Cord enhancement from C2-C4 had increased. Neurology follow-up placed on intravenous Solu-Medrol and transitioned to POwith taper as directed. Infectious disease follow-up advised to complete21 day course of IV acyclovir .Subcutaneous Lovenox for DVT prophylaxis. Tolerating a regular diet.   Past Medical History      Past Medical History:  Diagnosis Date  . Anxiety   . Back pain   . Myelitis due to herpes simplex ([place])     Family History  family history includes Hypertension in her mother.  Prior Rehab/Hospitalizations:  Has the patient had major surgery during 100 days prior to admission? No  Current Medications   Current Facility-Administered Medications:  .  0.9 %  sodium chloride infusion, , Intravenous, Continuous, Fredia Sorrow, MD, Last Rate: 75 mL/hr at 10/08/16 1141 .  acetaminophen (TYLENOL) tablet 650 mg, 650 mg, Oral, Q6H PRN  **OR** acetaminophen (TYLENOL) suppository 650 mg, 650 mg, Rectal, Q6H PRN, Norval Morton, MD .  acyclovir (ZOVIRAX) 650 mg in dextrose 5 % 100 mL IVPB, 10 mg/kg, Intravenous, Q8H, Rondell A Tamala Julian, MD, Last Rate: 113 mL/hr at 10/08/16 0543, 650 mg at 10/08/16 0543 .  albuterol (PROVENTIL) (2.5 MG/3ML) 0.083% nebulizer solution 2.5 mg, 2.5 mg, Nebulization, Q2H PRN, Norval Morton, MD .  ALPRAZolam Duanne Moron) tablet 1 mg, 1 mg, Oral, Daily PRN, Norval Morton, MD .  diazepam (VALIUM) tablet 5 mg, 5 mg, Oral, Q12H PRN, Norval Morton, MD, 5 mg at 10/07/16 2204 .  enoxaparin (LOVENOX) injection 40 mg, 40 mg, Subcutaneous, Q24H, Rondell A Smith, MD, 40 mg at 10/07/16 1431 .  gabapentin (NEURONTIN) capsule 100 mg, 100 mg, Oral, TID, Norval Morton, MD, 100 mg at 10/08/16 0836 .  HYDROcodone-acetaminophen (NORCO) 10-325 MG per tablet 1 tablet, 1 tablet, Oral, Q4H PRN, Velvet Bathe, MD, 1 tablet at 10/08/16 1138 .  morphine 4 MG/ML injection 2 mg, 2 mg, Intravenous, Q3H PRN, Norval Morton, MD, 2 mg at 10/08/16 0837 .  ondansetron (ZOFRAN) tablet 4 mg, 4 mg, Oral, Q6H PRN **OR** ondansetron (ZOFRAN) injection 4 mg, 4 mg, Intravenous, Q6H PRN, Norval Morton, MD .  pantoprazole (PROTONIX) EC tablet 40 mg, 40 mg, Oral, Daily, Norval Morton, MD, 40 mg at 10/08/16 0836 .  [START ON 10/10/2016] predniSONE (DELTASONE) tablet 10 mg, 10 mg, Oral, Once, Greta Doom, MD .  Derrill Memo ON 10/09/2016] predniSONE (DELTASONE) tablet 20 mg, 20 mg, Oral, Once, Greta Doom, MD .  sodium chloride flush (NS) 0.9 % injection 10-40 mL, 10-40 mL, Intracatheter, PRN, Velvet Bathe, MD  Patients Current Diet: Diet regular Room service appropriate? Yes; Fluid consistency: Thin  Precautions / Restrictions Precautions Precautions: Fall Restrictions Weight Bearing Restrictions: No   Has the patient had 2 or more falls or a fall with injury in the past year?No  Prior Activity Level Community (5-7x/wk):  Independent and active pta; recent visit to [facility] to visit friends, very social  Development worker, international aid / [place] Devices/Equipment: Hand-held shower hose Home Equipment: Bedside commode, Environmental consultant - 2 wheels, Adaptive equipment (gait  belt)  Prior Device Use: Indicate devices/aids used by the patient prior to current illness, exacerbation or injury? None of the above  Prior Functional Level Prior Function Level of Independence:  (was totally independent prior to this disease process) Gait / Transfers Assistance Needed: min assist for ambulation with RW.  min asssit to stand from lower chair.  ADL's / Homemaking Assistance Needed: Sponge bathed PTA per pt due to PICC -someone helped with all of it; Assist with dressing; A to feed pt as well.   Comments: [place] in the bathroom a few times per pt  Self Care: Did the patient need help bathing, dressing, using the toilet or eating?  Independent  Indoor Mobility: Did the patient need assistance with walking from room to room (with or without device)? Independent  Stairs: Did the patient need assistance with internal or external stairs (with or without device)? Independent  Functional Cognition: Did the patient need help planning regular tasks such as shopping or remembering to take medications? Independent  Current Functional Level Cognition  Overall Cognitive Status: Within Functional Limits for tasks assessed Orientation Level: Oriented X4    Extremity Assessment (includes Sensation/Coordination)  Upper Extremity Assessment: RUE deficits/detail, LUE deficits/detail RUE Deficits / Details: Strength grossly 4/5. Pt reports impaired sensation. Poor grip strength. Pt with poor coordaintion; dysmetria and athetoid movements. RUE Sensation: decreased light touch, decreased proprioception RUE Coordination: decreased fine motor, decreased gross motor LUE Deficits / Details: Strength grossly 4/5. Pt reports  impaired sensation. Poor grip strength. Pt with poor coordaintion; dysmetria and athetoid movements. Pt reports L worse than R side. LUE Sensation: decreased light touch, decreased proprioception LUE Coordination: decreased fine motor, decreased gross motor  Lower Extremity Assessment: RLE deficits/detail, LLE deficits/detail RLE Deficits / Details: strength 4-/5, ataxia RLE Sensation: decreased light touch, decreased proprioception RLE Coordination: decreased fine motor, decreased gross motor LLE Deficits / Details: strength 3/5, some ataxia LLE Sensation: decreased light touch, decreased proprioception LLE Coordination: decreased fine motor, decreased gross motor    ADLs  Overall ADL's : Needs assistance/impaired Eating/Feeding: Set up, With adaptive utensils, Sitting Eating/Feeding Details (indicate cue type and reason): Pt with orange foam in room to use as built up handle for utensils. Pt reports she has just been using foam for hand strengthening. Educated pt and husband on use of foam for built up handle; pt able to demo use with spoon to mouth. Grooming: Minimal assistance, Sitting Upper Body Bathing: Minimal assistance, Sitting Lower Body Bathing: Moderate assistance, Sit to/from stand Upper Body Dressing : Minimal assistance, Sitting Upper Body Dressing Details (indicate cue type and reason): to don/doff hospital gown Lower Body Dressing: Moderate assistance, Sit to/from stand Lower Body Dressing Details (indicate cue type and reason): Pt able to adjust socks in sitting. Anticipate she would have difficulty getting items started over her feet and pulling up clothing in standing. Toilet Transfer: Moderate assistance, +2 for safety/equipment, Ambulation, BSC, RW, Cueing for safety, Cueing for sequencing Toilet Transfer Details (indicate cue type and reason): Simulated by sit to stand from EOB with functional mobility in room. Functional mobility during ADLs: Moderate assistance, +2  for safety/equipment, Rolling walker, Cueing for safety, Cueing for sequencing General ADL Comments: Discussed need for post acute rehab; pt and husband are agreeable and hopeful for CIR placement.    Mobility  Overal bed mobility: Needs Assistance Bed Mobility: Supine to Sit, Sit to Supine Supine to sit: Mod assist Sit to supine: Min guard General bed mobility comments: Assist for trunk elevation  from supine to sit and scooting hips out toward EOB. Cues for sequencing and hand placement.Took incr time for pt to scoot to EOB and did best with reciprocal scooting. Had to have reminders to place hands correctly as to not injure them as they want to invert and pronate but pt cannot coordinate them.  Does best with pressure applied over hands hand over hand for movement.       Transfers  Overall transfer level: Needs assistance Equipment used: Rolling walker (2 wheeled) Transfers: Sit to/from Stand Sit to Stand: Mod assist, +2 safety/equipment Stand pivot transfers: Min guard General transfer comment: Mod assist to boost up from EOB and couch. Cues for hand placement and technique as pt with athetoid and dysmetric movements making transitions difficult and unsteady.     Ambulation / Gait / Stairs / Wheelchair Mobility  Ambulation/Gait Ambulation/Gait assistance: Mod assist, +2 physical assistance Ambulation Distance (Feet): 40 Feet Assistive device: Rolling walker (2 wheeled) Gait Pattern/deviations: Step-through pattern, Decreased stride length, Decreased dorsiflexion - left, Trunk flexed, Decreased step length - right, Decreased step length - left, Ataxic, Scissoring, Staggering left, Drifts right/left, Staggering right, Narrow base of support General Gait Details: Pt with slow, grossly unsteady gait with assistance needed to steady and cues to separate feet to widen BOS; cues for bilat heel strike.  Pt at times scissoring needing cues to separate feet.  Posterior lean which pt could  correct with cues but could not maintain postural stability for any length of time.  Pt also had difficulty keeping her right UE as well as Left UE on RW due to dysmetria (right worse then left).  Poor postural stability overall. Fatigues and as she fatigues her posture and poor coordination worsens.   Gait velocity: decreased Gait velocity interpretation: Below normal speed for age/gender    Posture / Balance Dynamic Sitting Balance Sitting balance - Comments: can sit EOB for up to 5 min but cannot use UEs effectively to assist her with sitting with continued athetoid movements when using UEs.  Difficult for pt to use hands functionally due to this.  Does best if she brings hands together to grasp objects.   Balance Overall balance assessment: Needs assistance Sitting-balance support: Feet supported, Bilateral upper extremity supported Sitting balance-Leahy Scale: Fair Sitting balance - Comments: can sit EOB for up to 5 min but cannot use UEs effectively to assist her with sitting with continued athetoid movements when using UEs.  Difficult for pt to use hands functionally due to this.  Does best if she brings hands together to grasp objects.   Standing balance support: Bilateral upper extremity supported Standing balance-Leahy Scale: Poor Standing balance comment: walker and mod assist  for static standing with significant posterior lean limiting pt stability. High level balance activites: Turns High Level Balance Comments: mod assist with turns with cues to stay close to RW.  Ataxia made it difficult as well.     Special needs/care consideration BiPAP/CPAP  N/a CPM  N/a Continuous Drip IV  yes Dialysis  N/a Life Vest  N/a Oxygen  N/a Special Bed  N/a Trach Size  N/a Wound Vac (area)  N/a Skin rash to back. Knee, thigh, groin  and leg, some blisters to back and leg                         Bowel mgmt: 2/4 continent Bladder mgmt: wears briefs and som incontinence Diabetic mgmt   N/a Was seeing Dr. Nelva Bush  and Dr. Rolena Infante for bulging disc and receiving pain med injection with Dr. Nelva Bush that she would like to inquired into during CIR stay. Pt reports wash clothes , towels, etc are painful and rough to her skin since this diagnosis .    Previous Home Environment Living Arrangements: Spouse/significant other  Lives With: Spouse Available Help at Discharge: Family, Available 24 hours/day Type of Home: House Home Layout: Two level, Able to live on main level with bedroom/bathroom Home Access: Stairs to enter Entrance Stairs-Rails: None Entrance Stairs-Number of Steps: 2 Bathroom Shower/Tub: Tub/shower unit, Air cabin crew Accessibility: No (down stair sbathroom small for pt must enter sideways with u) Home Care Services: Yes Type of Home Care Services: Home PT, [place] (if known): ?[facility]  Discharge Living Setting Plans for Discharge Living Setting: Patient's home, Lives with (comment) Type of Home at Discharge: House Discharge Home Layout: Able to live on main level with bedroom/bathroom, Two level Discharge Home Access: Stairs to enter Entrance Stairs-Rails: None Entrance Stairs-Number of Steps: 2 Discharge Bathroom Shower/Tub: Tub/shower unit, Curtain Discharge Bathroom Toilet: Standard Discharge Bathroom Accessibility: No (has to walk in sideways with use of RW) Does the patient have any problems obtaining your medications?: No  Social/Family/Support Systems Patient Roles: Spouse, Parent Contact Information: Annie Main, spouse Anticipated Caregiver: spouse, children and grandchildren Anticipated Caregiver's Contact Information: see above Ability/Limitations of Caregiver: spouse can provide min assist Caregiver Availability: 24/7 Discharge Plan Discussed with Primary Caregiver: Yes Is Caregiver In Agreement with Plan?: Yes Does Caregiver/Family have Issues with Lodging/Transportation while Pt is in Rehab?:  No  Goals/Additional Needs Patient/Family Goal for Rehab: mod I to supervision with PT and OT Expected length of stay: ELOS 13-18 days Pt/Family Agrees to Admission and willing to participate: Yes Program Orientation Provided & Reviewed with Pt/Caregiver Including Roles  & Responsibilities: Yes  Decrease burden of Care through IP rehab admission: n/a  Possible need for SNF placement upon discharge:not anticipated  Patient Condition: This patient's medical and functional status has changed since the consult dated: 10/05/2016 in which the Rehabilitation Physician determined and documented that the patient's condition is appropriate for intensive rehabilitative care in an inpatient rehabilitation facility. See "History of Present Illness" (above) for medical update. Functional changes are: overall mod assist. Patient's medical and functional status update has been discussed with the Rehabilitation physician and patient remains appropriate for inpatient rehabilitation. Will admit to inpatient rehab today.  Preadmission Screen Completed By:  Cleatrice Burke, 10/08/2016 12:22 PM ______________________________________________________________________   Discussed status with Dr. Posey Pronto on 10/08/16 at  1223 and received telephone approval for admission today.  Admission Coordinator:  Cleatrice Burke, time [id number] Date 10/08/2016       Cosigned by: Ankit Lorie Phenix, MD at 10/08/2016 12:25 PM  Revision History

## 2016-10-08 NOTE — Progress Notes (Signed)
[name], MD Physician Signed Physical Medicine and Rehabilitation  Consult Note Date of Service: 10/05/2016 12:23 PM  Related encounter: ED to Hosp-Admission (Current) from 10/02/2016 in [place] Collapse All   [] Hide copied text [] Hover for attribution information      Physical Medicine and Rehabilitation Consult Reason for Consult: Myelitis Referring Physician: [facility]   HPI: Katelyn Lamb is a 71 y.o. right handed female with history of anxiety and chronic back pain. Presented with a five-day history of progressive worsening weakness of lower extremities. Just recently hospitalized at [facility] 1/18-1/24 for HSV encephalitis and myelitis discharged to home with a PICC line to complete 21 day course of IV acyclovir. Per chart review patient lives with spouse needed minimal assist for ambulation with rolling walker. Minimal assist to stand from a low chair. She needed some assistance with dressing. 2 level home with bedroom on first floor. After being discharged to home she was not continued on high dose steroids previously given during her hospitalization. Husband reported a gradual decline overall functional status with associated symptoms of numbness from the nipple line down as well as involuntary hand movements. Noted bladder and bowel incontinence. Presented 10/03/2016 due to her increasing weakness. MRI of the brain showed no new intracranial abnormality. MRI of the spine showed improved appearance of the paramedian frontal and cingulate gyrus signal abnormality with resolved restricted diffusion and decreased enhancement. Decreased patchy T2 signal abnormality and expansion throughout the cervical and thoracic cord. Cord enhancement from C2-C4 had increased. Neurology follow-up placed on intravenous Solu-Medrol. Infectious disease follow-up advised to finish out 21 day course of plan IV acyclovir. Subcutaneous Lovenox  for DVT prophylaxis. Tolerating a regular diet. Physical therapy evaluation completed 10/04/2016 with recommendations of physical medicine rehabilitation consult.   Review of Systems  Constitutional: Negative for chills and fever.  HENT: Negative for hearing loss and tinnitus.   Eyes: Negative for blurred vision and double vision.  Respiratory: Negative for cough and shortness of breath.   Cardiovascular: Negative for chest pain, palpitations and leg swelling.  Gastrointestinal: Negative for abdominal pain, nausea and vomiting.  Genitourinary: Negative for hematuria.       Bladder incontinence  Musculoskeletal: Positive for joint pain, myalgias and neck pain. Negative for falls.  Skin: Negative for rash.  Neurological: Positive for sensory change and weakness. Negative for seizures and loss of consciousness.  Psychiatric/Behavioral:       Anxiety  All other systems reviewed and are negative.      Past Medical History:  Diagnosis Date  . Anxiety   . Back pain   . Myelitis due to herpes simplex [facility])         Past Surgical History:  Procedure Laterality Date  . ABDOMINAL HYSTERECTOMY    . BLADDER REPAIR    . CESAREAN SECTION    . TUBAL LIGATION          Family History  Problem Relation Age of Onset  . Hypertension Mother    Social History:  reports that she has quit smoking. She has never used smokeless tobacco. She reports that she does not drink alcohol or use drugs. Allergies:       Allergies  Allergen Reactions  . Demerol [Meperidine] Other (See Comments)    Hallucinations  . Amoxicillin-Pot Clavulanate Other (See Comments)  . Penicillins Other (See Comments)         Medications Prior to Admission  Medication Sig Dispense Refill  . acyclovir 650 mg in dextrose 5 % 100 mL Inject 650 mg into the vein every 8 (eight) hours. TAKE FOR 16 MORE DAYS. 31200 mg 0  . ALPRAZolam (XANAX) 1 MG tablet Take 1 mg by mouth at bedtime as needed for anxiety or  sleep.     . diazepam (VALIUM) 5 MG tablet Take 1 tablet (5 mg total) by mouth 2 (two) times daily. (Patient taking differently: Take 5 mg by mouth every 12 (twelve) hours as needed for muscle spasms. ) 10 tablet 0  . gabapentin (NEURONTIN) 100 MG capsule Take 100 mg by mouth 3 (three) times daily.    Marland Kitchen HYDROcodone-acetaminophen (NORCO) 10-325 MG per tablet Take 1 tablet by mouth every 8 (eight) hours as needed for moderate pain. 30 tablet 0  . calcium carbonate (OS-CAL) 600 MG TABS tablet Take 600 mg by mouth 2 (two) times daily with a meal.    . cholecalciferol (VITAMIN D) 1000 units tablet Take 1,000 Units by mouth daily.    . diphenhydramine-acetaminophen (TYLENOL PM) 25-500 MG TABS tablet Take 1 tablet by mouth at bedtime as needed (sleep/pain).    Marland Kitchen ketorolac (TORADOL) 10 MG tablet Take 1 tablet (10 mg total) by mouth every 6 (six) hours as needed. (Patient not taking: Reported on 10/02/2016) 20 tablet 0  . omega-3 acid ethyl esters (LOVAZA) 1 G capsule Take by mouth 2 (two) times daily.      Home: Home Living Family/patient expects to be discharged to:: Private residence Living Arrangements: Spouse/significant other Available Help at Discharge: Family, Available 24 hours/day Type of Home: House Home Access: Stairs to enter [employer] of Steps: 2 Entrance Stairs-Rails: None Home Layout: Two level, Able to live on main level with bedroom/bathroom Bathroom Shower/Tub: Tub/shower unit, Architectural technologist: Standard Bathroom Accessibility: Yes (can go in sideways) Home Equipment: Bedside commode, Walker - 2 wheels, Adaptive equipment (gait belt) Adaptive Equipment: Reacher  Functional History: Prior Function Level of Independence: Needs assistance Gait / Transfers Assistance Needed: min assist for ambulation with RW.  min asssit to stand from lower chair.  (Sponge bathed; ) ADL's / Homemaking Assistance Needed: Sponge bathed PTA per pt due to PICC -someone  helped with all of it; Assist with dressing; A to feed pt as well.   Comments: [place] in the bathroom a few times per pt Functional Status:  Mobility: Bed Mobility Overal bed mobility: Needs Assistance Bed Mobility: Supine to Sit, Sit to Supine Supine to sit: Mod assist Sit to supine: Min guard General bed mobility comments: Assist for trunk elevation from supine to sit and scooting hips out toward EOB. Cues for sequencing and hand placement.Took incr time for pt to scoot to EOB and did best with reciprocal scooting. Had to have reminders to place hands correctly as to not injure them.  Transfers Overall transfer level: Needs assistance Equipment used: Rolling walker (2 wheeled) Transfers: Sit to/from Stand Sit to Stand: Mod assist, +2 safety/equipment General transfer comment: Mod assist to boost up from EOB and couch. Cues for hand placement and technique as pt with athetoid and dysmetric movements making transitions difficult and unsteady.  Ambulation/Gait Ambulation/Gait assistance: Mod assist, +2 physical assistance Ambulation Distance (Feet): 20 Feet (10 feet x 2) Assistive device: Rolling walker (2 wheeled) Gait Pattern/deviations: Step-through pattern, Decreased stride length, Decreased dorsiflexion - left, Trunk flexed General Gait Details: slow, grossly unsteady gait with assistance needed to steady and cues to separate feet to widen BOS; cues  for bilat heel strike.  Pt also had difficulty keeping her right UE on RW due to dysmetria.  Poor postural stability overall.  Husband present and said gait got progressively worse.  Gait velocity: decreased Gait velocity interpretation: Below normal speed for age/gender  ADL: ADL Overall ADL's : Needs assistance/impaired Eating/Feeding: Set up, With adaptive utensils, Sitting Eating/Feeding Details (indicate cue type and reason): Pt with orange foam in room to use as built up handle for utensils. Pt reports she has just been using foam  for hand strengthening. Educated pt and husband on use of foam for built up handle; pt able to demo use with spoon to mouth. Grooming: Minimal assistance, Sitting Upper Body Bathing: Minimal assistance, Sitting Lower Body Bathing: Moderate assistance, Sit to/from stand Upper Body Dressing : Minimal assistance, Sitting Upper Body Dressing Details (indicate cue type and reason): to don/doff hospital gown Lower Body Dressing: Moderate assistance, Sit to/from stand Lower Body Dressing Details (indicate cue type and reason): Pt able to adjust socks in sitting. Anticipate she would have difficulty getting items started over her feet and pulling up clothing in standing. Toilet Transfer: Moderate assistance, +2 for safety/equipment, Ambulation, BSC, RW, Cueing for safety, Cueing for sequencing Toilet Transfer Details (indicate cue type and reason): Simulated by sit to stand from EOB with functional mobility in room. Functional mobility during ADLs: Moderate assistance, +2 for safety/equipment, Rolling walker, Cueing for safety, Cueing for sequencing General ADL Comments: Discussed need for post acute rehab; pt and husband are agreeable and hopeful for CIR placement.  Cognition: Cognition Overall Cognitive Status: Within Functional Limits for tasks assessed Orientation Level: Oriented X4 Cognition Arousal/Alertness: Awake/alert Behavior During Therapy: Anxious, WFL for tasks assessed/performed Overall Cognitive Status: Within Functional Limits for tasks assessed  Blood pressure 130/72, pulse 100, temperature 98 F (36.7 C), resp. rate 16, height 5\' 6"  (1.676 m), weight 63.5 kg (140 lb), SpO2 93 %. Physical Exam  Constitutional: She is oriented to person, place, and time.  HENT:  Head: Normocephalic.  Eyes: EOM are normal.  Neck: Normal range of motion. Neck supple. No thyromegaly present.  Cardiovascular: Normal rate, regular rhythm and normal heart sounds.   Respiratory: Effort normal and  breath sounds normal. No respiratory distress.  GI: Soft. Bowel sounds are normal. She exhibits no distension.  Neurological: She is alert and oriented to person, place, and time.  Bilateral sensory loss from neck/collar line to toes. Left seems a little more affected than right. Bilateral limb ataxia Left greater than right. Strength RUE: 4-deltoid, 4/5 biceps,triceps, wrist, hand. LUE: 3- to 3/5 deltoid, 3+ to 4- biceps, triceps, wrist, hand. LE: 3+/5 HF, 3+ to 4- KE and 4/5 ADF/PF.   Skin: Skin is warm and dry.  Psychiatric: She has a normal mood and affect. Her behavior is normal. Thought content normal.    Lab Results Last 24 Hours  No results found for this or any previous visit (from the past 24 hour(s)).   Imaging Results (Last 48 hours)  No results found.    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 Does the need for close, 24 hr/day medical supervision in concert with the patient's rehab needs make it unreasonable for this patient to be served in a less intensive setting? Yes 2. Co-Morbidities requiring supervision/potential complications: hyponatremia which requires serial labs, thrombocytopenia--check serial cbc's and monitor clinically, iv acyclovir and steroids, pain mgt 3. Due to bladder management, bowel management, safety, skin/wound care, disease management, medication administration, pain management and  patient education, does the patient require 24 hr/day rehab nursing? Yes 4. Does the patient require coordinated care of a physician, rehab nurse, PT (1-2 hrs/day, 5 days/week) and OT (1-2 hrs/day, 5 days/week) to address physical and functional deficits in the context of the above medical diagnosis(es)? Yes Addressing deficits in the following areas: balance, endurance, locomotion, strength, transferring, bowel/bladder control, bathing, dressing, feeding, grooming, toileting and psychosocial support 5. Can the patient  actively participate in an intensive therapy program of at least 3 hrs of therapy per day at least 5 days per week? Yes 6. The potential for patient to make measurable gains while on inpatient rehab is excellent 7. Anticipated functional outcomes upon discharge from inpatient rehab are modified independent and supervision  with PT, modified independent and supervision with OT, n/a with SLP. 8. Estimated rehab length of stay to reach the above functional goals is: 13-18 days 9. Does the patient have adequate social supports and living environment to accommodate these discharge functional goals? Yes 10. Anticipated D/C setting: Home 11. Anticipated post D/C treatments: [place] therapy 12. Overall Rehab/Functional Prognosis: excellent  RECOMMENDATIONS: This patient's condition is appropriate for continued rehabilitative care in the following setting: CIR Patient has agreed to participate in recommended program. Yes Note that insurance prior authorization may be required for reimbursement for recommended care.  Comment: Rehab Admissions Coordinator to follow up.  Thanks,  [name], MD, Mellody Drown    Cathlyn Parsons., PA-C 10/05/2016    Revision History                        Routing History

## 2016-10-08 NOTE — Progress Notes (Signed)
Patient to transfer to 4W rm 17. Husband by the bedside aware of transfer. Report given via phone to nurse Barnett Applebaum at [facility]. All belongings with patient, leaving the unit now.

## 2016-10-08 NOTE — Care Management Important Message (Signed)
Important Message  Patient Details  Name: Katelyn Lamb MRN: [id number] Date of Birth: 1945/10/28   Medicare Important Message Given:  Yes    Espn Zeman Montine Circle 10/08/2016, 4:55 PM

## 2016-10-08 NOTE — Progress Notes (Signed)
I have insurance approval to admit pt to inpt rehab today. I met with pt at bedside and discussed with her spouse by phone. They are both in agreement.I contacted Dr. Vega and we will make the arrangements. I will alert RN CM. [phone] 

## 2016-10-08 NOTE — Progress Notes (Signed)
PROGRESS NOTE    Katelyn Lamb  [id number] DOB: [birth year] DOA: 10/02/2016 PCP: Katelyn Amel, MD   Brief Narrative:  71 y.o. female with medical history significant of anxiety and chronic back pain; presents with a five-day history of progressively worsening weakness. Just recently hospitalized here at [facility] 1/18-1/24 for HSV encephalitis and myelitis discharged home with PICC to complete  21 day course of IV acyclovir   Assessment & Plan:   Active Problems:   Myelitis due to herpes simplex [facility]): CERVICAL AND THORACIC - Plan is to continue acyclovir and IV methylprednisolone. - Neurology evaluated patient and recommended prednisone taper. They have signed off - ID consulted and recommended continuing acyclovir.    Thrombocytopenia ([place]) - Stable and improved from last check    Anxiety - Stable    DVT prophylaxis: Lovenox Code Status: Full Family Communication: None at bedside Disposition Plan: Awaiting acceptance into Inpatient rehab vs SNF. Ready medically for transition once bed available. For now will continue prednisone taper and IV acyclovir in house.   Consultants:   ID  Neurology   Procedures: None   Antimicrobials: Acyclovir   Subjective: The patient has no new complaints. No new complaints.  Objective: Vitals:   10/07/16 0502 10/07/16 1339 10/07/16 2137 10/08/16 0513  BP: 121/62 118/67 113/63 128/62  Pulse: (!) 52 60 69 (!) 57  Resp: 17 17 18 17   Temp: 97.8 F (36.6 C) 98.6 F (37 C) 98.5 F (36.9 C) 98.2 F (36.8 C)  TempSrc: Oral Oral Oral Oral  SpO2: 99% 96% 99% 97%  Weight:      Height:        Intake/Output Summary (Last 24 hours) at 10/08/16 0956 Last data filed at 10/08/16 0900  Gross per 24 hour  Intake             1140 ml  Output             2250 ml  Net            -1110 ml   Filed Weights   10/02/16 1943  Weight: 63.5 kg (140 lb)    Examination:  General exam: Appears calm and comfortable , In no acute  distress Respiratory system: Clear to auscultation. Respiratory effort normal., No wheezes Cardiovascular system: S1 & S2 heard, RRR. No JVD, murmurs, rubs, gallops or clicks. No pedal edema. Gastrointestinal system: Abdomen is nondistended, soft and nontender. No organomegaly or masses felt. Normal bowel sounds heard. Central nervous system: Alert and oriented. No facial asymmetry Extremities: No cyanosis Skin: No rashes, lesions or ulcers, limited exam Psychiatry: Judgement and insight appear normal. Mood & affect appropriate.   Data Reviewed: I have personally reviewed following labs and imaging studies  CBC:  Recent Labs Lab 10/02/16 2153 10/03/16 0719  WBC 8.2 6.3  NEUTROABS 4.7 5.7  HGB 12.9 13.3  HCT 39.7 40.2  MCV 96.8 94.4  PLT 127* [id number]*   Basic Metabolic Panel:  Recent Labs Lab 10/02/16 2153  NA 136  K 3.8  CL 98*  CO2 25  GLUCOSE 126*  BUN 12  CREATININE 0.86  CALCIUM 8.5*   GFR: Estimated Creatinine Clearance: 57.8 mL/min (by C-G formula based on SCr of 0.86 mg/dL). Liver Function Tests:  Recent Labs Lab 10/02/16 2153  AST 19  ALT 22  ALKPHOS 69  BILITOT 0.4  PROT 5.2*  ALBUMIN 2.7*   No results for input(s): LIPASE, AMYLASE in the last 168 hours. No results for input(s): AMMONIA in the  last 168 hours. Coagulation Profile: No results for input(s): INR, PROTIME in the last 168 hours. Cardiac Enzymes: No results for input(s): CKTOTAL, CKMB, CKMBINDEX, TROPONINI in the last 168 hours. BNP (last 3 results) No results for input(s): PROBNP in the last 8760 hours. HbA1C: No results for input(s): HGBA1C in the last 72 hours. CBG: No results for input(s): GLUCAP in the last 168 hours. Lipid Profile: No results for input(s): CHOL, HDL, LDLCALC, TRIG, CHOLHDL, LDLDIRECT in the last 72 hours. Thyroid Function Tests: No results for input(s): TSH, T4TOTAL, FREET4, T3FREE, THYROIDAB in the last 72 hours. Anemia Panel: No results for input(s):  VITAMINB12, FOLATE, FERRITIN, TIBC, IRON, RETICCTPCT in the last 72 hours. Sepsis Labs: No results for input(s): PROCALCITON, LATICACIDVEN in the last 168 hours.  No results found for this or any previous visit (from the past 240 hour(s)).   Radiology Studies: No results found.  Scheduled Meds: . acyclovir (ZOVIRAX) </= 700 mg IVPB  10 mg/kg Intravenous Q8H  . enoxaparin (LOVENOX) injection  40 mg Subcutaneous Q24H  . gabapentin  100 mg Oral TID  . pantoprazole  40 mg Oral Daily  . [START ON 10/10/2016] predniSONE  10 mg Oral Once  . [START ON 10/09/2016] predniSONE  20 mg Oral Once   Continuous Infusions: . sodium chloride 75 mL/hr at 10/07/16 1742     LOS: 5 days    Time spent:  > 35 minutes  Velvet Bathe, MD [facility] Pager [phone]  If 7PM-7AM, please contact night-coverage [email or web address] Password [email or web address] 10/08/2016, 9:56 AM

## 2016-10-08 NOTE — Progress Notes (Signed)
Pt admitted to unit at 1730 with husband at bedside. RN reviewed rehab booklet, schedule and safety plan with verbal understanding. Call bell within reach, husband at bedside. RN provided choice of soft call bell with pt stating she does not need one. Husband ordering dinner for patient.

## 2016-10-09 ENCOUNTER — Inpatient Hospital Stay (HOSPITAL_COMMUNITY): Payer: PPO | Admitting: Physical Therapy

## 2016-10-09 ENCOUNTER — Inpatient Hospital Stay (HOSPITAL_COMMUNITY): Payer: PPO | Admitting: Occupational Therapy

## 2016-10-09 ENCOUNTER — Inpatient Hospital Stay (HOSPITAL_COMMUNITY): Payer: PPO

## 2016-10-09 DIAGNOSIS — G0491 Myelitis, unspecified: Secondary | ICD-10-CM

## 2016-10-09 DIAGNOSIS — M62838 Other muscle spasm: Secondary | ICD-10-CM

## 2016-10-09 DIAGNOSIS — M7989 Other specified soft tissue disorders: Secondary | ICD-10-CM

## 2016-10-09 DIAGNOSIS — N319 Neuromuscular dysfunction of bladder, unspecified: Secondary | ICD-10-CM

## 2016-10-09 DIAGNOSIS — M792 Neuralgia and neuritis, unspecified: Secondary | ICD-10-CM

## 2016-10-09 DIAGNOSIS — G8222 Paraplegia, incomplete: Secondary | ICD-10-CM

## 2016-10-09 DIAGNOSIS — D62 Acute posthemorrhagic anemia: Secondary | ICD-10-CM

## 2016-10-09 LAB — COMPREHENSIVE METABOLIC PANEL
ALBUMIN: 3 g/dL — AB (ref 3.5–5.0)
ALK PHOS: 58 U/L (ref 38–126)
ALT: 50 U/L (ref 14–54)
AST: 33 U/L (ref 15–41)
Anion gap: 12 (ref 5–15)
BUN: 12 mg/dL (ref 6–20)
CALCIUM: 8.7 mg/dL — AB (ref 8.9–10.3)
CO2: 30 mmol/L (ref 22–32)
CREATININE: 0.8 mg/dL (ref 0.44–1.00)
Chloride: 96 mmol/L — ABNORMAL LOW (ref 101–111)
GFR calc Af Amer: >60 mL/min (ref >60)
GFR calc non Af Amer: >60 mL/min (ref >60)
GLUCOSE: 80 mg/dL (ref 65–99)
Potassium: 3 mmol/L — ABNORMAL LOW (ref 3.5–5.1)
SODIUM: 138 mmol/L (ref 135–145)
Total Bilirubin: 0.4 mg/dL (ref 0.3–1.2)
Total Protein: 5.6 g/dL — ABNORMAL LOW (ref 6.5–8.1)

## 2016-10-09 LAB — CBC WITH DIFFERENTIAL/PLATELET
BASOS PCT: 0 %
Basophils Absolute: 0 10*3/uL (ref 0.0–0.1)
EOS ABS: 0.3 10*3/uL (ref 0.0–0.7)
Eosinophils Relative: 2 %
HEMATOCRIT: 38.5 % (ref 36.0–46.0)
HEMOGLOBIN: 13.1 g/dL (ref 12.0–15.0)
LYMPHS ABS: 2.9 10*3/uL (ref 0.7–4.0)
Lymphocytes Relative: 28 %
MCH: 31.8 pg (ref 26.0–34.0)
MCHC: 34 g/dL (ref 30.0–36.0)
MCV: 93.4 fL (ref 78.0–100.0)
Monocytes Absolute: 0.5 10*3/uL (ref 0.1–1.0)
Monocytes Relative: 5 %
NEUTROS ABS: 6.6 10*3/uL (ref 1.7–7.7)
NEUTROS PCT: 65 %
Platelets: 163 10*3/uL (ref 150–400)
RBC: 4.12 MIL/uL (ref 3.87–5.11)
RDW: 13.3 % (ref 11.5–15.5)
WBC: 10.2 10*3/uL (ref 4.0–10.5)

## 2016-10-09 MED ORDER — DIAZEPAM 5 MG PO TABS
5.0000 mg | ORAL_TABLET | Freq: Two times a day (BID) | ORAL | Status: DC
  Administered 2016-10-09 – 2016-10-26 (×33): 5 mg via ORAL
  Filled 2016-10-09 (×34): qty 1

## 2016-10-09 MED ORDER — ENOXAPARIN SODIUM 30 MG/0.3ML ~~LOC~~ SOLN
30.0000 mg | Freq: Two times a day (BID) | SUBCUTANEOUS | Status: DC
  Administered 2016-10-10: 30 mg via SUBCUTANEOUS
  Filled 2016-10-09: qty 0.3

## 2016-10-09 MED ORDER — HYDROCODONE-ACETAMINOPHEN 10-325 MG PO TABS
1.0000 | ORAL_TABLET | ORAL | Status: DC | PRN
  Administered 2016-10-09 – 2016-10-10 (×4): 2 via ORAL
  Administered 2016-10-10: 1 via ORAL
  Administered 2016-10-10: 2 via ORAL
  Administered 2016-10-10 – 2016-10-11 (×3): 1 via ORAL
  Administered 2016-10-11 – 2016-10-12 (×5): 2 via ORAL
  Administered 2016-10-12: 1 via ORAL
  Administered 2016-10-12 – 2016-10-20 (×21): 2 via ORAL
  Administered 2016-10-21: 1 via ORAL
  Administered 2016-10-21 – 2016-10-22 (×5): 2 via ORAL
  Administered 2016-10-23: 1 via ORAL
  Administered 2016-10-23 – 2016-10-25 (×8): 2 via ORAL
  Administered 2016-10-25: 1 via ORAL
  Administered 2016-10-25: 2 via ORAL
  Administered 2016-10-26 (×2): 1 via ORAL
  Filled 2016-10-09 (×4): qty 2
  Filled 2016-10-09: qty 1
  Filled 2016-10-09 (×2): qty 2
  Filled 2016-10-09: qty 1
  Filled 2016-10-09 (×4): qty 2
  Filled 2016-10-09: qty 1
  Filled 2016-10-09 (×12): qty 2
  Filled 2016-10-09: qty 1
  Filled 2016-10-09: qty 2
  Filled 2016-10-09: qty 1
  Filled 2016-10-09 (×2): qty 2
  Filled 2016-10-09: qty 1
  Filled 2016-10-09 (×3): qty 2
  Filled 2016-10-09: qty 1
  Filled 2016-10-09: qty 2
  Filled 2016-10-09: qty 1
  Filled 2016-10-09 (×20): qty 2

## 2016-10-09 MED ORDER — ENSURE ENLIVE PO LIQD
237.0000 mL | ORAL | Status: DC
  Administered 2016-10-10 – 2016-10-25 (×16): 237 mL via ORAL

## 2016-10-09 MED ORDER — POTASSIUM CHLORIDE CRYS ER 20 MEQ PO TBCR
20.0000 meq | EXTENDED_RELEASE_TABLET | Freq: Two times a day (BID) | ORAL | Status: AC
  Administered 2016-10-09 – 2016-10-10 (×4): 20 meq via ORAL
  Filled 2016-10-09 (×5): qty 1

## 2016-10-09 NOTE — Progress Notes (Signed)
Patient information reviewed and entered into eRehab system by Maripat Borba, RN, CRRN, PPS Coordinator.  Information including medical coding and functional independence measure will be reviewed and updated through discharge.     Per nursing patient was given "Data Collection Information Summary for Patients in Inpatient Rehabilitation Facilities with attached "Privacy Act Statement-Health Care Records" upon admission.  

## 2016-10-09 NOTE — Progress Notes (Signed)
RN received report from doppler study of a positive dvt in her left lower extremity. RN notified PA on call. PA increased lovenox dose and gave instructions to monitor. Pt has no noticeable swelling to LLE and reports no pain in leg at this time. RN will continue to monitor. Continue plan of care.

## 2016-10-09 NOTE — IPOC Note (Signed)
Overall Plan of Care [facility]) Katelyn Lamb Details Name: Katelyn Lamb MRN: [id number] DOB: [birth year]  Admitting Diagnosis: Tetraplegia Mydelitis  Hospital Problems: Active Problems:   Myelitis ([place])   Incomplete paraplegia ([place])   Acute blood loss anemia   Neurogenic bladder     Functional Problem List: Nursing Bladder, Bowel, Endurance, Medication Management, Pain, Perception, Safety, Sensory  PT Balance, Endurance, Motor, Sensory  OT Balance, Endurance, Motor, Pain, Safety, Sensory  SLP    TR         Basic ADL's: OT Eating, Grooming, Bathing, Dressing, Toileting     Advanced  ADL's: OT Laundry, Simple Meal Preparation     Transfers: PT Bed Mobility, Bed to Chair, Car, [employer], Floor  OT Toilet     Locomotion: PT Ambulation, Stairs     Additional Impairments: OT None  SLP        TR      Anticipated Outcomes Item Anticipated Outcome  Self Feeding supervision  Swallowing      Basic self-care  supervision  Toileting  supervision   Bathroom Transfers supervision  Bowel/Bladder  Minimal assist  Transfers  supervision  Locomotion  supervision gait  Communication     Cognition     Pain  4 or less  Safety/Judgment  minimal assist   Therapy Plan: PT Intensity: Minimum of 1-2 x/day ,45 to 90 minutes PT Frequency: 5 out of 7 days PT Duration Estimated Length of Stay: 14-17days OT Intensity: Minimum of 1-2 x/day, 45 to 90 minutes OT Frequency: 5 out of 7 days OT Duration/Estimated Length of Stay: 14-17 days         Team Interventions: Nursing Interventions Katelyn Lamb/Family Education, Medication Management, Bladder Management, Bowel Management, Disease Management/Prevention, Pain Management, Discharge Planning, Psychosocial Support  PT interventions Ambulation/gait training, Discharge planning, DME/adaptive equipment instruction, Functional mobility training, Pain management, Splinting/orthotics, Therapeutic Activities, UE/LE Strength taining/ROM, UE/LE  Coordination activities, Therapeutic Exercise, Stair training, Katelyn Lamb/family education, Neuromuscular re-education, Functional electrical stimulation, Community reintegration, Training and development officer  OT Interventions Discharge planning, Training and development officer, Disease mangement/prevention, Academic librarian, Functional mobility training, Psychosocial support, Therapeutic Activities, UE/LE Coordination activities, Katelyn Lamb/family education, DME/adaptive equipment instruction, Pain management, Skin care/wound managment, UE/LE Strength taining/ROM, Therapeutic Exercise, Self Care/advanced ADL retraining, Wheelchair propulsion/positioning  SLP Interventions    TR Interventions    SW/CM Interventions      Team Discharge Planning: Destination: PT-Home ,OT- Home , SLP-  Projected Follow-up: PT-Home health PT, Outpatient PT, OT-  24 hour supervision/assistance, SLP-  Projected Equipment Needs: PT-To be determined, OT- To be determined, SLP-  Equipment Details: PT- , OT-  Katelyn Lamb/family involved in discharge planning: PT- Katelyn Lamb, Family member/caregiver,  OT-Katelyn Lamb, Family member/caregiver, SLP-   MD ELOS: 14-17 days. Medical Rehab Prognosis:  Good Assessment: 71 y.o.right handed femalewith history of anxiety and chronic back pain. Presented with a five-day history of progressive worsening weakness of lower extremities. Just recently hospitalized at [facility] 1/18-1/24 for HSV encephalitis and myelitis discharged to home with a PICC line to complete 21 day course of IV acyclovir. Per chart review, Katelyn Lamb lives with spouse needed minimal assist for ambulation with rolling walker. Minimal assist to stand from a low chair. Katelyn Lamb needed some assistance with dressing. 2 level home with bedroom on first floor. After being discharged to home Katelyn Lamb was not continued on high dose steroids previously given during her hospitalization. Husband reported a gradual decline overall functional  status with associated symptoms of numbness from the nipple line down as well as involuntary hand movements.  Noted bladder and bowel incontinence. Presented 10/03/2016 due to her increasing weakness. MRI of the brain reviewed, no new intracranial abnormality. MRI of the spine showed improved appearance of the paramedian frontal and cingulate gyrus signal abnormality with resolved restricted diffusion and decreased enhancement. Decreased patchy T2 signal abnormality and expansion throughout the cervical and thoracic cord. Cord enhancement from C2-C4 had increased. Neurology follow-up placed on intravenous Solu-Medrol and transitioned to PO with taper as directed. Infectious disease follow-up advised to complete 21 day course of  IV acyclovir . Pt with resulting functional deficits with mobility, transfers, ataxia, [place], self-care.  Will set goals for Supervision/Min A with PT/OT.  See Team Conference Notes for weekly updates to the plan of care

## 2016-10-09 NOTE — Progress Notes (Signed)
Nutrition Brief Note  Patient identified on the Malnutrition Screening Tool (MST) Report  Wt Readings from Last 15 Encounters:  10/08/16 141 lb 8 oz (64.2 kg)  10/02/16 140 lb (63.5 kg)  09/20/16 143 lb 3.2 oz (65 kg)  06/24/14 149 lb 12.8 oz (67.9 kg)  05/19/14 145 lb (65.8 kg)    Body mass index is 22.84 kg/m. Patient meets criteria for normal based on current BMI. Weight has been stable.   Current diet order is regular, patient is consuming approximately 100% of meals at this time. Pt reports eating well currently and PTA with no other difficulties. Labs and medications reviewed. Pt currently has Ensure ordered and has been consuming them. RD to modify orders to only once daily as intake has been adequate.  No further nutrition interventions warranted at this time. If nutrition issues arise, please consult RD.   Corrin Parker, MS, RD, LDN Pager # [phone] After hours/ weekend pager # [phone]

## 2016-10-09 NOTE — Evaluation (Signed)
Occupational Therapy Assessment and Plan  Patient Details  Name: Katelyn Lamb MRN: [id number] Date of Birth: [birth year]  OT Diagnosis: ataxia, muscle weakness (generalized) and impaired sensation, coordination disorder Rehab Potential: Rehab Potential (ACUTE ONLY): Good ELOS: 14-17 days   Today's Date: 10/09/2016 OT Individual Time: 3888-2800 OT Individual Time Calculation (min): 74 min     Problem List:  Patient Active Problem List   Diagnosis Date Noted  . Incomplete paraplegia ([place])   . Acute blood loss anemia   . Neurogenic bladder   . Myelitis due to herpes simplex ([place])   . Neuropathic pain   . Muscle spasm   . Gastroesophageal reflux disease   . Slow transit constipation   . Thrombocytopenia ([place]) 10/03/2016  . Anxiety 10/03/2016  . Abnormal MRI, spinal cord   . Encephalitis and encephalomyelitis   . Myelitis ([place])   . Numbness   . Intractable back pain 09/20/2016  . Numbness of left lower extremity 09/20/2016  . Hyponatremia 09/20/2016  . Herpes zoster 09/20/2016    Past Medical History:  Past Medical History:  Diagnosis Date  . Anxiety   . Back pain   . Myelitis due to herpes simplex [facility])    Past Surgical History:  Past Surgical History:  Procedure Laterality Date  . ABDOMINAL HYSTERECTOMY    . BLADDER REPAIR    . CESAREAN SECTION    . TUBAL LIGATION      Assessment & Plan Clinical Impression: Patient is a 71 y.o. right handed femalewith history of anxiety and chronic back pain. Presented with a five-day history of progressive worsening weakness of lower extremities. Just recently hospitalized at [facility] 1/18-1/24 for HSV encephalitis and myelitis discharged to home with a PICC line to complete 21 day course of IV acyclovir. Per chart review, patient lives with spouse needed minimal assist for ambulation with rolling walker. Minimal assist to stand from a low chair. She needed some assistance with dressing. 2 level home with bedroom on first  floor. After being discharged to home she was not continued on high dose steroids previously given during her hospitalization. Husband reported a gradual decline overall functional status with associated symptoms of numbness from the nipple line down as well as involuntary hand movements. Noted bladder and bowel incontinence. Presented 10/03/2016 due to her increasing weakness. MRI of the brain reviewed, no new intracranial abnormality. MRI of the spine showed improved appearance of the paramedian frontal and cingulate gyrus signal abnormality with resolved restricted diffusion and decreased enhancement. Decreased patchy T2 signal abnormality and expansion throughout the cervical and thoracic cord. Cord enhancement from C2-C4 had increased. Neurology follow-up placed on intravenous Solu-Medrol and transitioned to PO with taper as directed. Infectious disease follow-up advised to complete 21 day course of  IV acyclovir . Subcutaneous Lovenox for DVT prophylaxis. Tolerating a regular diet. Physical and occupational therapy evaluations completed 10/04/2016 with recommendations of physical medicine rehabilitation consult.    Patient transferred to CIR on 10/08/2016 .    Patient currently requires mod- total a with basic self-care skills secondary to muscle weakness, decreased cardiorespiratoy endurance, ataxia and decreased coordination and decreased sitting balance, decreased standing balance and decreased balance strategies.  Prior to hospitalization, patient could complete ADLs and IADLs with independent .  Patient will benefit from skilled intervention to decrease level of assist with basic self-care skills prior to discharge home with care partner.  Anticipate patient will require 24 hour supervision and follow up home health.  OT - End of  Session Activity Tolerance: Decreased this session Endurance Deficit: Yes Endurance Deficit Description: multiple rest breaks secondary to fatigue OT Assessment Rehab  Potential (ACUTE ONLY): Good OT Patient demonstrates impairments in the following area(s): Balance;Endurance;Motor;Pain;Safety;Sensory OT Basic ADL's Functional Problem(s): Eating;Grooming;Bathing;Dressing;Toileting OT Advanced ADL's Functional Problem(s): Laundry;Simple Meal Preparation OT Transfers Functional Problem(s): Toilet OT Additional Impairment(s): None OT Plan OT Intensity: Minimum of 1-2 x/day, 45 to 90 minutes OT Frequency: 5 out of 7 days OT Duration/Estimated Length of Stay: 14-17 days OT Treatment/Interventions: Discharge planning;Balance/vestibular training;Disease mangement/prevention;Community reintegration;Functional mobility training;Psychosocial support;Therapeutic Activities;UE/LE Coordination activities;Patient/family education;DME/adaptive equipment instruction;Pain management;Skin care/wound managment;UE/LE Strength taining/ROM;Therapeutic Exercise;Self Care/advanced ADL retraining;Wheelchair propulsion/positioning OT Self Feeding Anticipated Outcome(s): supervision OT Basic Self-Care Anticipated Outcome(s): supervision OT Toileting Anticipated Outcome(s): supervision OT Bathroom Transfers Anticipated Outcome(s): supervision OT Recommendation Recommendations for Other Services: Neuropsych consult;Therapeutic Recreation consult Therapeutic Recreation Interventions: Kitchen group;Pet therapy Patient destination: Home Follow Up Recommendations: 24 hour supervision/assistance Equipment Recommended: To be determined   Skilled Therapeutic Intervention Upon entering the room, pt supine in bed with husband present during evaluation. Pt with 3/10 c/o pain in lower back this session but agreeable to OT intervention. OT educated pt and caregiver on OT purpose, POC, and goals . They verbalized understanding and agreement. Pt declined shower this session. Pt performed UB self care tasks seated on EOB and LB tasks supine in bed for safety. Pt needing mod lifting assistance for sit  <>stand. Mod stand pivot transfer into wheelchair with RW. Pt seated in wheelchair at sink for grooming tasks with set up A. OT built up handle of toothbrush for pt to have greater control. Pt remained in wheelchair with call bell and all needed items within reach upon exiting the room.   OT Evaluation Precautions/Restrictions  Precautions Precautions: Fall Restrictions Weight Bearing Restrictions: No Pain Pain Assessment Pain Assessment: 0-10 Pain Score: 3  Pain Type: Chronic pain Pain Location: Back Pain Orientation: Lower Pain Descriptors / Indicators: Aching Pain Frequency: Constant Pain Onset: On-going Patients Stated Pain Goal: 2 Pain Intervention(s): Repositioned Home Living/Prior Functioning Home Living Available Help at Discharge: Family, Available 24 hours/day Type of Home: House Home Access: Stairs to enter Technical brewer of Steps: 2 Entrance Stairs-Rails: None Home Layout: Two level, Able to live on main level with bedroom/bathroom Bathroom Shower/Tub: Tub/shower unit, Architectural technologist: Standard Additional Comments: Pt able to enter downstairs bathroom sideways with RW for toileting but likely unable to shower due to accessibility of bathroom with RW  Lives With: Spouse Vision/Perception  Vision- History Baseline Vision/History: No visual deficits Patient Visual Report: No change from baseline  Cognition Overall Cognitive Status: Within Functional Limits for tasks assessed Arousal/Alertness: Awake/alert Orientation Level: Person;Place;Situation Person: Oriented Place: Oriented Situation: Oriented Year: 2018 Month: February Day of Week: Correct Immediate Memory Recall: Sock;Blue;Bed Memory Recall: Sock;Blue;Bed Memory Recall Sock: Without Cue Memory Recall Blue: Without Cue Memory Recall Bed: Without Cue Sensation Sensation Light Touch: Impaired Detail Light Touch Impaired Details: Impaired RUE;Impaired LUE;Impaired RLE;Impaired  LLE Stereognosis: Not tested Hot/Cold: Not tested Proprioception: Impaired Detail Proprioception Impaired Details: Impaired RUE;Impaired LUE;Impaired RLE;Impaired LLE Additional Comments: B UEs with greater impairment Coordination Gross Motor Movements are Fluid and Coordinated: No Fine Motor Movements are Fluid and Coordinated: No Coordination and Movement Description: dysmetria, uncoordinated movements Motor  Motor Motor: Ataxia;Abnormal postural alignment and control Motor - Skilled Clinical Observations: dysmetira, ataxia  Trunk/Postural Assessment  Cervical Assessment Cervical Assessment: Within Functional Limits Thoracic Assessment Thoracic Assessment: Within Functional Limits Lumbar Assessment Lumbar Assessment: Within Functional Limits Postural  Control Postural Control: Deficits on evaluation Trunk Control: impaired, posterior biax Righting Reactions: delayed  Balance Dynamic Standing Balance Dynamic Standing - Comments: max A Extremity/Trunk Assessment RUE Assessment RUE Assessment:  (3+/5) LUE Assessment LUE Assessment:  (3+/5)   See Function Navigator for Current Functional Status.   Refer to Care Plan for Long Term Goals  Recommendations for other services: Neuropsych and Therapeutic Recreation  Pet therapy and Kitchen group   Discharge Criteria: Patient will be discharged from OT if patient refuses treatment 3 consecutive times without medical reason, if treatment goals not met, if there is a change in medical status, if patient makes no progress towards goals or if patient is discharged from hospital.  The above assessment, treatment plan, treatment alternatives and goals were discussed and mutually agreed upon: by patient and by family  [name] 10/09/2016, 10:23 AM

## 2016-10-09 NOTE — Progress Notes (Signed)
Results of dopplers noted--will increase lovenox to 30 mg bid and recheck dopplers in a week to monitor for propagation/resolution/stability. Will continue to monitor for any pain or swelling of LLE  in the interim.

## 2016-10-09 NOTE — Discharge Summary (Signed)
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 Just recently hospitalized here at [facility] 1/18-1/24for HSV encephalitis and myelitisdischarged home with PICCto complete 21 day course of IV acyclovir.  Presented this admission complaining of continued neurological deficiencies which improved with prednisone taper and acyclocir  Hospital Course:  Active Problems:   Myelitis due to herpes simplex [facility]): CERVICAL AND THORACIC - Neurology evaluated patient and recommended prednisone taper.  - ID consulted and recommended continuing acyclovir. - CIR on discharge.    Thrombocytopenia ([place]) - Stable     Anxiety - Stable  Procedures:  None  Consultations:  ID  Neuro  Discharge Exam: Vitals:   10/08/16 0513 10/08/16 1249  BP: 128/62 117/66  Pulse: (!) 57 84  Resp: 17 17  Temp: 98.2 F (36.8 C) 98.3 F (36.8 C)    General: Pt in nad, alert and awake Cardiovascular: rrr, no rubs Respiratory: no increased wob, no wheezes  Discharge Instructions    Discharge Medication List as of 10/08/2016  5:32 PM    CONTINUE these medications which have NOT CHANGED   Details  acyclovir 650 mg in dextrose 5 % 100 mL Inject 650 mg into the vein every 8 (eight) hours. TAKE FOR 16 MORE DAYS., Starting  Wed 09/26/2016, Until Fri 10/12/2016, Print    ALPRAZolam Duanne Moron) 1 MG tablet Take 1 mg by mouth at bedtime as needed for anxiety or sleep. , Starting Tue 03/23/2014, Historical Med    diazepam (VALIUM) 5 MG tablet Take 1 tablet (5 mg total) by mouth 2 (two) times daily., Starting Sat 09/15/2016, Print    gabapentin (NEURONTIN) 100 MG capsule Take 100 mg by mouth 3 (three) times daily., Starting Fri 09/14/2016, Historical Med    HYDROcodone-acetaminophen (NORCO) 10-325 MG per tablet Take 1 tablet by mouth every 8 (eight) hours as needed for moderate pain., Starting Thu 06/24/2014, Print    calcium carbonate (OS-CAL) 600 MG TABS tablet Take 600 mg by mouth 2 (two) times daily with a meal., Until Discontinued, Historical Med    cholecalciferol (VITAMIN D) 1000 units tablet Take 1,000 Units by mouth daily., Historical Med    diphenhydramine-acetaminophen (TYLENOL PM) 25-500 MG TABS tablet Take 1 tablet by mouth at bedtime as needed (sleep/pain)., Historical Med    ketorolac (TORADOL) 10 MG tablet Take 1 tablet (10 mg total) by mouth every 6 (six) hours as needed., Starting Sun 09/09/2016, Print    omega-3 acid ethyl esters (LOVAZA) 1 G capsule Take by mouth 2 (two) times daily., Until Discontinued, Historical Med       Allergies  Allergen Reactions  . Demerol [Meperidine] Other (See Comments)    Hallucinations  . Amoxicillin-Pot Clavulanate Other (See Comments)  . Penicillins Other (See Comments)      The results of significant diagnostics from this hospitalization (including imaging, microbiology, ancillary  and laboratory) are listed below for reference.    Significant Diagnostic Studies: Dg Lumbar Spine Complete  Result Date: 09/09/2016 CLINICAL DATA:  Low back pain with left leg pain EXAM: LUMBAR SPINE - COMPLETE 4+ VIEW COMPARISON:  Lumbar MRI 02/26/2008 FINDINGS: 5 mm anterior slip L4-L5 has progressed in the interval. Remaining alignment normal. Negative for fracture or pars defect.  Facet degeneration at L4-5. Disc spaces intact. IMPRESSION: Grade 1 anterolisthesis L4-5 has progressed since 2009. No acute abnormality Electronically Signed   By: Franchot Gallo M.D.   On: 09/09/2016 18:59   Mr Brain Wo Contrast  Result Date: 09/21/2016 CLINICAL DATA:  71 y/o F; worsening back pain and lower extremity weakness/numbness. EXAM: MRI HEAD WITHOUT CONTRAST TECHNIQUE: Multiplanar, multiecho pulse sequences of the brain and surrounding structures were obtained without intravenous contrast. COMPARISON:  09/20/2016 cervical MRI.  08/01/2016 MRI of the brain. FINDINGS: Brain: Increased T2 FLAIR signal within the right paramedian frontal sulci (series 12, image 157). Within the bilateral paramedian cingulate and right frontal lobes in the region of incomplete FLAIR suppression there is cortical diffusion restriction and increased T2 FLAIR hyperintense signal (series 7 image 17 and series 5, image 26). Humerus T2 FLAIR hyperintense foci in white matter in both periventricular and subcortical white matter are similar in distribution in comparison with the prior MRI. On the sagittal T2 FLAIR volumetric sequence there are foci of signal abnormality at the cervicomedullary junction and upper cervical cord better characterized on the prior cervical MRI. No abnormal susceptibility hypointensity. No focal mass effect. No hydrocephalus. Vascular: Normal flow voids. Skull and upper cervical spine: Normal marrow signal. Sinuses/Orbits: Right mastoid effusion. No abnormal signal of paranasal sinuses or left mastoid air cell. Bilateral intra-ocular lens replacement. Other: None. IMPRESSION: 1. Cortical diffusion and FLAIR signal abnormality with some increased sulcal FLAIR signal centered in the anterior paramedian frontal and cingulate gyri. 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 The pattern of meningeal and cortical inflammation is not typical of  multiple sclerosis or neuromyelitis optica favoring an infectious or other autoimmune etiology. Consider follow-up with MRI of the brain with contrast. 2. Stable nonspecific T2 FLAIR hyperintense white matter foci may represent microvascular ischemic changes particularly in the setting of diabetes or hypertension or sequelae of demyelination, vasculitis, and other infectious/inflammatory processes. Electronically Signed   By: Kristine Garbe M.D.   On: 09/21/2016 02:57   Mr Jeri Cos And Wo Contrast  Result Date: 10/03/2016 CLINICAL DATA:  Recent hospitalization for HSV encephalitis and myelitis. Progressive decline in functional status with worsening weakness, decreased ability to walk, involuntary hand movements, and loss of coordination. Numbness from the nipple line down. EXAM: MRI HEAD WITHOUT AND WITH CONTRAST MRI CERVICAL SPINE WITHOUT AND WITH CONTRAST MRI THORACIC SPINE WITHOUT AND WITH CONTRAST MRI LUMBAR SPINE WITHOUT AND WITH CONTRAST TECHNIQUE: Multiplanar, multiecho pulse sequences of the brain and surrounding structures, cervical spine, to include the craniocervical junction and cervicothoracic junction, and thoracic and lumbar spine were obtained without and with intravenous contrast. CONTRAST:  13 mL MultiHance COMPARISON:  Brain MRI 09/23/2016.  Total spine MRI 09/21/2015. FINDINGS: MRI HEAD FINDINGS Brain: Cortically based signal abnormality in the paramedian frontal and cingulate gyri has overall improved. There is no residual diffusion abnormality in this region, and only minimal cortical enhancement remains, greater on the left (series 47, images 30 and 35 and series 48, image 25). Abnormal FLAIR signal in this region is similar to the prior study (  series 6, images 16 and 18). No abnormal leptomeningeal enhancement is identified. There is a punctate focus of subtly increased trace diffusion signal in the inferior ventral medulla right of midline, decreased from prior. There is  associated enhancement in the medulla (best seen on the cervical spine examination, series 45, image 8). No new regions of signal abnormality are identified in the brain. There is no evidence of acute infarct, intracranial hemorrhage, mass, midline shift, or extra-axial fluid collection. Patchy T2 hyperintensities in the cerebral white matter and pons are unchanged and nonspecific, moderate for age. Vascular: Major intracranial vascular flow voids are preserved. Skull and upper cervical spine: Unremarkable bone marrow signal. Sinuses/Orbits: Unchanged small right mastoid effusion. Clear paranasal sinuses. Bilateral cataract extraction. Other: None. MRI CERVICAL SPINE FINDINGS Alignment: Straightening and slight reversal of the normal cervical lordosis. No listhesis. Vertebrae: No evidence of fracture or suspicious osseous lesion. C3-C7 degenerative endplate changes. Cord: Abnormal T2 hyperintensity within the cord from C2-C7 has decreased in intensity, and cord expansion has decreased. Associated enhancement in the cord from C2-C4 has increased from the prior study and is more confluent with greatest involvement of the dorsal cord. More central cord enhancement at C6-7 has mildly decreased. Posterior Fossa, vertebral arteries, paraspinal tissues: Small focus of enhancement in the inferior ventral medulla may have minimally increased. Disc levels: Axial images are motion degraded, particularly the MERGE sequence. Disc degeneration does not appear significantly changed from the recent prior examination, most notable at C5-6 where a broad-based posterior disc osteophyte complex with more focal left paracentral component results in moderate to severe spinal stenosis with mild-to-moderate cord flattening and severe right and moderate to severe left neural foraminal stenosis. MRI THORACIC SPINE FINDINGS Alignment:  Normal. Vertebrae: No evidence of thoracic spine fracture. 10 mm T2 hyperintense focus in the T2 vertebral  body is unchanged and may represent an atypical hemangioma. Cord: Patchy T2 hyperintensity within the spinal cord at T2, T6, and T7-8 has decreased. The cord still appears slightly expanded at T7-8. There is persistent enhancement in each of these areas of T2 signal abnormality, however the extent and intensity of the enhancement have decreased. No new cord lesions are identified. Paraspinal and other soft tissues: Unremarkable. Disc levels: Unchanged right central disc protrusion at T5-6 contacting and slightly flattening the ventral spinal cord without significant stenosis. MRI LUMBAR SPINE FINDINGS Segmentation:  Standard. Alignment: Facet mediated grade 1 anterolisthesis of L4 on L5, unchanged. Vertebrae: No evidence of fracture or suspicious osseous lesion. Large L1 vertebral body hemangioma. Conus medullaris: Extends to the L1-2 level and appears normal. The cauda equina is unremarkable. No abnormal intradural enhancement or nerve root thickening. Paraspinal and other soft tissues: Prominent bladder distention though less than on the prior MRI. Disc levels: Unchanged lumbar disc and facet degeneration compared to the recent prior examination, most notable at L4-5 where there is anterolisthesis with bulging uncovered disc and advanced facet arthrosis resulting in mild spinal stenosis, moderate bilateral lateral recess stenosis, and moderate bilateral neural foraminal stenosis. IMPRESSION: 1. Improved appearance of the paramedian frontal and cingulate gyrus signal abnormality, with resolved restricted diffusion and decreased enhancement. 2. Punctate signal abnormality in the medulla with interval decreased diffusion abnormality and stable to slightly increased enhancement. 3. No new intracranial abnormality. 4. Decreased patchy T2 signal abnormality and expansion throughout the cervical and thoracic spinal cord. Cord enhancement from C2-C4 has increased, while cord enhancement elsewhere has improved. 5. No new  cord lesions. Electronically Signed   By: Logan Bores  M.D.   On: 10/03/2016 07:40   Mr Jeri Cos [id number] Contrast  Result Date: 09/23/2016 CLINICAL DATA:  71 y/o  F; encephalitis. EXAM: MRI HEAD WITHOUT AND WITH CONTRAST TECHNIQUE: Multiplanar, multiecho pulse sequences of the brain and surrounding structures were obtained without and with intravenous contrast. CONTRAST:  79mL MULTIHANCE GADOBENATE DIMEGLUMINE 529 MG/ML IV SOLN COMPARISON:  09/21/2016 MRI of the brain. FINDINGS: Brain: Cortical T2 FLAIR hyperintense signal abnormality with diffusion restriction in the paramedian frontal and cingulate gyri is stable in distribution in comparison with the prior MRI of the brain. Sulcal increased FLAIR signal is largely resolved with minimal residual in the right paramedian frontal lobe (series 6, image 18). After administration of intravenous contrast there is faint cortical enhancement which corresponds best to diffusion restricting cortex (series 8, image 28 and series 13, image 23). There is no appreciable leptomeningeal enhancement. There are stable foci of T2 FLAIR hyperintense signal abnormality in subcortical and periventricular white matter from prior MRIs. There is no new focus of diffusion restriction, focal mass effect, FLAIR signal abnormality, or susceptibility hypointensity. There is no additional abnormal enhancement of the brain. Vascular: Normal flow voids. Skull and upper cervical spine: Normal marrow signal. Sinuses/Orbits: Stable right mastoid effusion. No abnormal signal of paranasal sinuses. Orbits are unremarkable. Other: None. IMPRESSION: 1. The region of paramedian frontal and cingulate gyrus cortical T2 FLAIR and diffusion signal abnormality is stable and demonstrates mild enhancement. Sulcal FLAIR signal abnormality has largely resolved and there is no leptomeningeal enhancement. Findings are consistent with encephalitis, probably slightly improved given diminution in sulcal FLAIR signal  abnormality. No new areas of encephalitis are identified. 2. No new signal abnormality of the brain parenchyma. 3. Stable background of nonspecific T2 FLAIR hyperintense white matter foci may represent microvascular ischemic changes particularly in the setting of diabetes or hypertension or sequelae of demyelination, vasculitis, and other infectious/inflammatory processes. Electronically Signed   By: Kristine Garbe M.D.   On: 09/23/2016 06:07   Mr Lumbar Spine Wo Contrast  Result Date: 09/20/2016 CLINICAL DATA:  Acute on chronic low back pain radiating to buttocks and LEFT lower extremity with numbness and tingling. On Vicodin with minimal relief. EXAM: MRI CERVICAL, THORACIC SPINE WITH AND WITHOUT CONTRAST MR LUMBAR SPINE WITHOUT TECHNIQUE: Multiplanar and multiecho pulse sequences of the cervical spine, to include the craniocervical junction and cervicothoracic junction, and thoracic with and without contrast. Multiplanar and multi echo pulse sequences of the lumbar spine, were obtained without intravenous contrast. CONTRAST:  15 cc MultiHance COMPARISON:  CT chest May 19, 2014 and MRI of lumbar spine February 26, 2008 FINDINGS: MRI CERVICAL SPINE FINDINGS ALIGNMENT: Straightened cervical lordosis.  No malalignment. VERTEBRAE/DISCS: Vertebral bodies are intact. Moderate to severe C3-4 thru C6-7 disc height loss of with moderate chronic discogenic endplate changes and disc desiccation. No suspicious bone marrow signal. No suspicious osseous or intradiscal enhancement. CORD:Expansile T2 bright signal and patchy varying enhancement predominately involving the posterior columns C2 through C4-5 and more central within the spinal cord at C5 through C7-T1. No myelomalacia. No syrinx. No abnormal leptomeningeal or epidural enhancement. POSTERIOR FOSSA, VERTEBRAL ARTERIES, PARASPINAL TISSUES: No MR findings of ligamentous injury. Vertebral artery flow voids present. Included posterior fossa and paraspinal  soft tissues are normal. DISC LEVELS: C2-3: Small central disc protrusion. Mild facet arthropathy without canal stenosis or neural foraminal narrowing. C3-4: 2 mm broad-based disc bulge, uncovertebral hypertrophy and mild facet arthropathy. Mild canal stenosis. Moderate RIGHT, mild to moderate LEFT neural foraminal narrowing. C4-5: Annular  bulging, uncovertebral hypertrophy and mild facet arthropathy. Mild canal stenosis. Moderate RIGHT and moderate to severe LEFT neural foraminal narrowing. C5-6: Broad-based disc bulge and LEFT central disc protrusion in total measure 3 mm in AP dimension. Moderate to severe canal stenosis, AP dimension the canal is 6 mm. Ventral cord deformity. Severe RIGHT and moderate to severe LEFT neural foraminal narrowing. C6-7: 2 mm broad-based disc bulge, uncovertebral hypertrophy and mild facet arthropathy. Moderate canal stenosis. Moderate to severe bilateral neural foraminal narrowing. C7-T1: No disc bulge, canal stenosis nor neural foraminal narrowing. MRI THORACIC SPINE FINDINGS ALIGNMENT: Maintenance of the thoracic kyphosis. No malalignment. VERTEBRAE/DISCS: Vertebral bodies are intact. Old minimally displaced sternal fracture. Intervertebral discs morphology and signal are normal. Multilevel mild chronic discogenic endplate changes/spurring. Moderate subacute on chronic discogenic endplate changes [id number]. Heterogeneous T1, bright STIR probable hemangioma T2, 10 mm. Additional large L1 hemangioma. No suspicious osseous or intradiscal enhancement. CORD: Patchy mildly expansile central spinal cord T2 hyperintense signal and varying enhancement T1-2 through T2, T5-6 through T7-8 with predominant dorsal cord involvement. Patchy T2 hyperintense signal within the conus medullaris. No syrinx. No abnormal leptomeningeal or epidural enhancement. No myelomalacia. PREVERTEBRAL AND PARASPINAL SOFT TISSUES:  Normal. DISC LEVELS: At T5-6 is 3 mm RIGHT central disc protrusion/extrusion without  migration, contacting the ventral spinal cord without canal stenosis or neural foraminal narrowing at any level. MRI LUMBAR SPINE FINDINGS SEGMENTATION: For the purposes of this report, the last well-formed intervertebral disc will be described as L5-S1. ALIGNMENT: Maintenance of the lumbar lordosis. Grade 1 L4-5 anterolisthesis is similar, no spondylolysis. VERTEBRAE:Vertebral bodies are intact. Mild L4-5 and L5-S1 disc height loss is similar with decreased T2 signal within the lower lumbar disc compatible with mild desiccation. Mild chronic discogenic endplate changes [id number] and L5-S1. No abnormal bone marrow signal. No suspicious bone marrow signal. Large L1 hemangioma with additional scattered small hemangiomas. CONUS MEDULLARIS: Conus medullaris terminates at L1-2. Cauda equina is normal. PARASPINAL AND SOFT TISSUES: Extremely distended urinary bladder. DISC LEVELS: L1-2: Small broad-based LEFT central disc protrusion, interval re- absorption of extrusion. No canal stenosis or neural foraminal narrowing. L2-3: No disc bulge, canal stenosis nor neural foraminal narrowing. L3-4: Annular bulging. Moderate facet arthropathy and ligamentum flavum redundancy with trace facet effusions which are likely reactive. No canal stenosis. Mild neural foraminal narrowing. L4-5: Anterolisthesis. Small broad-based disc bulge. Severe facet arthropathy and ligamentum flavum redundancy with trace facet effusions which are likely reactive. Mild canal stenosis including partial effacement lateral recesses which may affect the traversing L5 nerves. Moderate to severe RIGHT, moderate LEFT neural foraminal narrowing. L5-S1: Small broad-based disc bulge. Moderate to severe RIGHT, moderate LEFT facet arthropathy. Trace RIGHT facet effusion is likely reactive. No canal stenosis. Mild LEFT neural foraminal narrowing. IMPRESSION: MRI CERVICAL SPINE: Abnormal spinal cord signal and enhancement without discrete mass. Findings favor autoimmune or  viral/post viral myelitis. Recommend correlation with cerebral spinal fluid studies. Degenerative cervical spine results in moderate to severe canal stenosis C5-6, moderate at C6-7. Neural foraminal narrowing C3-4 thru C6-7: Severe on the RIGHT at C5-6, moderate to severe multiple levels. MRI THORACIC SPINE: Abnormal spinal cord signal and enhancement without discrete mass. 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 Recommend correlation with cerebral spinal fluid studies. Small T5-6 disc protrusion/extrusion without canal stenosis or neural foraminal narrowing. MRI LUMBAR SPINE: Re- absorption of L1-2 disc extrusion with small residual protrusion. Degenerative lumbar spine resulting in mild canal stenosis L4-5, similar grade 1 L4-5 anterolisthesis on degenerative basis. Neural foraminal narrowing L3-4 through  L5-S1: Moderate to severe on the RIGHT at L4-5. Preliminary acute findings discussed with and reconfirmed by PA.JAIME WARD on 09/20/2016 at 5:35 pm. Electronically Signed   By: Elon Alas M.D.   On: 09/20/2016 19:19   Mr Cervical Spine W Or Wo Contrast  Result Date: 10/03/2016 CLINICAL DATA:  Recent hospitalization for HSV encephalitis and myelitis. Progressive decline in functional status with worsening weakness, decreased ability to walk, involuntary hand movements, and loss of coordination. Numbness from the nipple line down. EXAM: MRI HEAD WITHOUT AND WITH CONTRAST MRI CERVICAL SPINE WITHOUT AND WITH CONTRAST MRI THORACIC SPINE WITHOUT AND WITH CONTRAST MRI LUMBAR SPINE WITHOUT AND WITH CONTRAST TECHNIQUE: Multiplanar, multiecho pulse sequences of the brain and surrounding structures, cervical spine, to include the craniocervical junction and cervicothoracic junction, and thoracic and lumbar spine were obtained without and with intravenous contrast. CONTRAST:  13 mL MultiHance COMPARISON:  Brain MRI 09/23/2016.  Total spine MRI 09/21/2015. FINDINGS: MRI HEAD FINDINGS Brain:  Cortically based signal abnormality in the paramedian frontal and cingulate gyri has overall improved. There is no residual diffusion abnormality in this region, and only minimal cortical enhancement remains, greater on the left (series 47, images 30 and 35 and series 48, image 25). Abnormal FLAIR signal in this region is similar to the prior study (series 6, images 16 and 18). No abnormal leptomeningeal enhancement is identified. There is a punctate focus of subtly increased trace diffusion signal in the inferior ventral medulla right of midline, decreased from prior. There is associated enhancement in the medulla (best seen on the cervical spine examination, series 45, image 8). No new regions of signal abnormality are identified in the brain. There is no evidence of acute infarct, intracranial hemorrhage, mass, midline shift, or extra-axial fluid collection. Patchy T2 hyperintensities in the cerebral white matter and pons are unchanged and nonspecific, moderate for age. Vascular: Major intracranial vascular flow voids are preserved. Skull and upper cervical spine: Unremarkable bone marrow signal. Sinuses/Orbits: Unchanged small right mastoid effusion. Clear paranasal sinuses. Bilateral cataract extraction. Other: None. MRI CERVICAL SPINE FINDINGS Alignment: Straightening and slight reversal of the normal cervical lordosis. No listhesis. Vertebrae: No evidence of fracture or suspicious osseous lesion. C3-C7 degenerative endplate changes. Cord: Abnormal T2 hyperintensity within the cord from C2-C7 has decreased in intensity, and cord expansion has decreased. Associated enhancement in the cord from C2-C4 has increased from the prior study and is more confluent with greatest involvement of the dorsal cord. More central cord enhancement at C6-7 has mildly decreased. Posterior Fossa, vertebral arteries, paraspinal tissues: Small focus of enhancement in the inferior ventral medulla may have minimally increased. Disc  levels: Axial images are motion degraded, particularly the MERGE sequence. Disc degeneration does not appear significantly changed from the recent prior examination, most notable at C5-6 where a broad-based posterior disc osteophyte complex with more focal left paracentral component results in moderate to severe spinal stenosis with mild-to-moderate cord flattening and severe right and moderate to severe left neural foraminal stenosis. MRI THORACIC SPINE FINDINGS Alignment:  Normal. Vertebrae: No evidence of thoracic spine fracture. 10 mm T2 hyperintense focus in the T2 vertebral body is unchanged and may represent an atypical hemangioma. Cord: Patchy T2 hyperintensity within the spinal cord at T2, T6, and T7-8 has decreased. The cord still appears slightly expanded at T7-8. There is persistent enhancement in each of these areas of T2 signal abnormality, however the extent and intensity of the enhancement have decreased. No new cord lesions are identified. Paraspinal and other  soft tissues: Unremarkable. Disc levels: Unchanged right central disc protrusion at T5-6 contacting and slightly flattening the ventral spinal cord without significant stenosis. MRI LUMBAR SPINE FINDINGS Segmentation:  Standard. Alignment: Facet mediated grade 1 anterolisthesis of L4 on L5, unchanged. Vertebrae: No evidence of fracture or suspicious osseous lesion. Large L1 vertebral body hemangioma. Conus medullaris: Extends to the L1-2 level and appears normal. The cauda equina is unremarkable. No abnormal intradural enhancement or nerve root thickening. Paraspinal and other soft tissues: Prominent bladder distention though less than on the prior MRI. Disc levels: Unchanged lumbar disc and facet degeneration compared to the recent prior examination, most notable at L4-5 where there is anterolisthesis with bulging uncovered disc and advanced facet arthrosis resulting in mild spinal stenosis, moderate bilateral lateral recess stenosis, and  moderate bilateral neural foraminal stenosis. IMPRESSION: 1. Improved appearance of the paramedian frontal and cingulate gyrus signal abnormality, with resolved restricted diffusion and decreased enhancement. 2. Punctate signal abnormality in the medulla with interval decreased diffusion abnormality and stable to slightly increased enhancement. 3. No new intracranial abnormality. 4. Decreased patchy T2 signal abnormality and expansion throughout the cervical and thoracic spinal cord. Cord enhancement from C2-C4 has increased, while cord enhancement elsewhere has improved. 5. No new cord lesions. Electronically Signed   By: Logan Bores M.D.   On: 10/03/2016 07:40   Mr Cervical Spine W Wo Contrast  Result Date: 09/20/2016 CLINICAL DATA:  Acute on chronic low back pain radiating to buttocks and LEFT lower extremity with numbness and tingling. On Vicodin with minimal relief. EXAM: MRI CERVICAL, THORACIC SPINE WITH AND WITHOUT CONTRAST MR LUMBAR SPINE WITHOUT TECHNIQUE: Multiplanar and multiecho pulse sequences of the cervical spine, to include the craniocervical junction and cervicothoracic junction, and thoracic with and without contrast. Multiplanar and multi echo pulse sequences of the lumbar spine, were obtained without intravenous contrast. CONTRAST:  15 cc MultiHance COMPARISON:  CT chest May 19, 2014 and MRI of lumbar spine February 26, 2008 FINDINGS: MRI CERVICAL SPINE FINDINGS ALIGNMENT: Straightened cervical lordosis.  No malalignment. VERTEBRAE/DISCS: Vertebral bodies are intact. Moderate to severe C3-4 thru C6-7 disc height loss of with moderate chronic discogenic endplate changes and disc desiccation. No suspicious bone marrow signal. No suspicious osseous or intradiscal enhancement. CORD:Expansile T2 bright signal and patchy varying enhancement predominately involving the posterior columns C2 through C4-5 and more central within the spinal cord at C5 through C7-T1. No myelomalacia. No syrinx. No  abnormal leptomeningeal or epidural enhancement. POSTERIOR FOSSA, VERTEBRAL ARTERIES, PARASPINAL TISSUES: No MR findings of ligamentous injury. Vertebral artery flow voids present. Included posterior fossa and paraspinal soft tissues are normal. DISC LEVELS: C2-3: Small central disc protrusion. Mild facet arthropathy without canal stenosis or neural foraminal narrowing. C3-4: 2 mm broad-based disc bulge, uncovertebral hypertrophy and mild facet arthropathy. Mild canal stenosis. Moderate RIGHT, mild to moderate LEFT neural foraminal narrowing. C4-5: Annular bulging, uncovertebral hypertrophy and mild facet arthropathy. Mild canal stenosis. Moderate RIGHT and moderate to severe LEFT neural foraminal narrowing. C5-6: Broad-based disc bulge and LEFT central disc protrusion in total measure 3 mm in AP dimension. Moderate to severe canal stenosis, AP dimension the canal is 6 mm. Ventral cord deformity. Severe RIGHT and moderate to severe LEFT neural foraminal narrowing. C6-7: 2 mm broad-based disc bulge, uncovertebral hypertrophy and mild facet arthropathy. Moderate canal stenosis. Moderate to severe bilateral neural foraminal narrowing. C7-T1: No disc bulge, canal stenosis nor neural foraminal narrowing. MRI THORACIC SPINE FINDINGS ALIGNMENT: Maintenance of the thoracic kyphosis. No malalignment. VERTEBRAE/DISCS: Vertebral  bodies are intact. Old minimally displaced sternal fracture. Intervertebral discs morphology and signal are normal. Multilevel mild chronic discogenic endplate changes/spurring. Moderate subacute on chronic discogenic endplate changes [id number]. Heterogeneous T1, bright STIR probable hemangioma T2, 10 mm. Additional large L1 hemangioma. No suspicious osseous or intradiscal enhancement. CORD: Patchy mildly expansile central spinal cord T2 hyperintense signal and varying enhancement T1-2 through T2, T5-6 through T7-8 with predominant dorsal cord involvement. Patchy T2 hyperintense signal within the conus  medullaris. No syrinx. No abnormal leptomeningeal or epidural enhancement. No myelomalacia. PREVERTEBRAL AND PARASPINAL SOFT TISSUES:  Normal. DISC LEVELS: At T5-6 is 3 mm RIGHT central disc protrusion/extrusion without migration, contacting the ventral spinal cord without canal stenosis or neural foraminal narrowing at any level. MRI LUMBAR SPINE FINDINGS SEGMENTATION: For the purposes of this report, the last well-formed intervertebral disc will be described as L5-S1. ALIGNMENT: Maintenance of the lumbar lordosis. Grade 1 L4-5 anterolisthesis is similar, no spondylolysis. VERTEBRAE:Vertebral bodies are intact. Mild L4-5 and L5-S1 disc height loss is similar with decreased T2 signal within the lower lumbar disc compatible with mild desiccation. Mild chronic discogenic endplate changes [id number]-GRM and L5-S1. No abnormal bone marrow signal. No suspicious bone marrow signal. Large L1 hemangioma with additional scattered small hemangiomas. CONUS MEDULLARIS: Conus medullaris terminates at L1-2. Cauda equina is normal. PARASPINAL AND SOFT TISSUES: Extremely distended urinary bladder. DISC LEVELS: L1-2: Small broad-based LEFT central disc protrusion, interval re- absorption of extrusion. No canal stenosis or neural foraminal narrowing. L2-3: No disc bulge, canal stenosis nor neural foraminal narrowing. L3-4: Annular bulging. Moderate facet arthropathy and ligamentum flavum redundancy with trace facet effusions which are likely reactive. No canal stenosis. Mild neural foraminal narrowing. L4-5: Anterolisthesis. Small broad-based disc bulge. Severe facet arthropathy and ligamentum flavum redundancy with trace facet effusions which are likely reactive. Mild canal stenosis including partial effacement lateral recesses which may affect the traversing L5 nerves. Moderate to severe RIGHT, moderate LEFT neural foraminal narrowing. L5-S1: Small broad-based disc bulge. Moderate to severe RIGHT, moderate LEFT facet arthropathy. Trace RIGHT  facet effusion is likely reactive. No canal stenosis. Mild LEFT neural foraminal narrowing. IMPRESSION: MRI CERVICAL SPINE: Abnormal spinal cord signal and enhancement without discrete mass. 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 Recommend correlation with cerebral spinal fluid studies. Degenerative cervical spine results in moderate to severe canal stenosis C5-6, moderate at C6-7. Neural foraminal narrowing C3-4 thru C6-7: Severe on the RIGHT at C5-6, moderate to severe multiple levels. MRI THORACIC SPINE: Abnormal spinal cord signal and enhancement without discrete mass. 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 Recommend correlation with cerebral spinal fluid studies. Small T5-6 disc protrusion/extrusion without canal stenosis or neural foraminal narrowing. MRI LUMBAR SPINE: Re- absorption of L1-2 disc extrusion with small residual protrusion. Degenerative lumbar spine resulting in mild canal stenosis L4-5, similar grade 1 L4-5 anterolisthesis on degenerative basis. Neural foraminal narrowing L3-4 through L5-S1: Moderate to severe on the RIGHT at L4-5. Preliminary acute findings discussed with and reconfirmed by PA.JAIME WARD on 09/20/2016 at 5:35 pm. Electronically Signed   By: Elon Alas M.D.   On: 09/20/2016 19:19   Mr Thoracic Spine W Wo Contrast  Result Date: 10/03/2016 CLINICAL DATA:  Recent hospitalization for HSV encephalitis and myelitis. Progressive decline in functional status with worsening weakness, decreased ability to walk, involuntary hand movements, and loss of coordination. Numbness from the nipple line down. EXAM: MRI HEAD WITHOUT AND WITH CONTRAST MRI CERVICAL SPINE WITHOUT AND WITH CONTRAST MRI THORACIC SPINE WITHOUT AND WITH CONTRAST MRI LUMBAR  SPINE WITHOUT AND WITH CONTRAST TECHNIQUE: Multiplanar, multiecho pulse sequences of the brain and surrounding structures, cervical spine, to include the craniocervical junction and cervicothoracic junction,  and thoracic and lumbar spine were obtained without and with intravenous contrast. CONTRAST:  13 mL MultiHance COMPARISON:  Brain MRI 09/23/2016.  Total spine MRI 09/21/2015. FINDINGS: MRI HEAD FINDINGS Brain: Cortically based signal abnormality in the paramedian frontal and cingulate gyri has overall improved. There is no residual diffusion abnormality in this region, and only minimal cortical enhancement remains, greater on the left (series 47, images 30 and 35 and series 48, image 25). Abnormal FLAIR signal in this region is similar to the prior study (series 6, images 16 and 18). No abnormal leptomeningeal enhancement is identified. There is a punctate focus of subtly increased trace diffusion signal in the inferior ventral medulla right of midline, decreased from prior. There is associated enhancement in the medulla (best seen on the cervical spine examination, series 45, image 8). No new regions of signal abnormality are identified in the brain. There is no evidence of acute infarct, intracranial hemorrhage, mass, midline shift, or extra-axial fluid collection. Patchy T2 hyperintensities in the cerebral white matter and pons are unchanged and nonspecific, moderate for age. Vascular: Major intracranial vascular flow voids are preserved. Skull and upper cervical spine: Unremarkable bone marrow signal. Sinuses/Orbits: Unchanged small right mastoid effusion. Clear paranasal sinuses. Bilateral cataract extraction. Other: None. MRI CERVICAL SPINE FINDINGS Alignment: Straightening and slight reversal of the normal cervical lordosis. No listhesis. Vertebrae: No evidence of fracture or suspicious osseous lesion. C3-C7 degenerative endplate changes. Cord: Abnormal T2 hyperintensity within the cord from C2-C7 has decreased in intensity, and cord expansion has decreased. Associated enhancement in the cord from C2-C4 has increased from the prior study and is more confluent with greatest involvement of the dorsal cord. More  central cord enhancement at C6-7 has mildly decreased. Posterior Fossa, vertebral arteries, paraspinal tissues: Small focus of enhancement in the inferior ventral medulla may have minimally increased. Disc levels: Axial images are motion degraded, particularly the MERGE sequence. Disc degeneration does not appear significantly changed from the recent prior examination, most notable at C5-6 where a broad-based posterior disc osteophyte complex with more focal left paracentral component results in moderate to severe spinal stenosis with mild-to-moderate cord flattening and severe right and moderate to severe left neural foraminal stenosis. MRI THORACIC SPINE FINDINGS Alignment:  Normal. Vertebrae: No evidence of thoracic spine fracture. 10 mm T2 hyperintense focus in the T2 vertebral body is unchanged and may represent an atypical hemangioma. Cord: Patchy T2 hyperintensity within the spinal cord at T2, T6, and T7-8 has decreased. The cord still appears slightly expanded at T7-8. There is persistent enhancement in each of these areas of T2 signal abnormality, however the extent and intensity of the enhancement have decreased. No new cord lesions are identified. Paraspinal and other soft tissues: Unremarkable. Disc levels: Unchanged right central disc protrusion at T5-6 contacting and slightly flattening the ventral spinal cord without significant stenosis. MRI LUMBAR SPINE FINDINGS Segmentation:  Standard. Alignment: Facet mediated grade 1 anterolisthesis of L4 on L5, unchanged. Vertebrae: No evidence of fracture or suspicious osseous lesion. Large L1 vertebral body hemangioma. Conus medullaris: Extends to the L1-2 level and appears normal. The cauda equina is unremarkable. No abnormal intradural enhancement or nerve root thickening. Paraspinal and other soft tissues: Prominent bladder distention though less than on the prior MRI. Disc levels: Unchanged lumbar disc and facet degeneration compared to the recent prior  examination, most  notable at L4-5 where there is anterolisthesis with bulging uncovered disc and advanced facet arthrosis resulting in mild spinal stenosis, moderate bilateral lateral recess stenosis, and moderate bilateral neural foraminal stenosis. IMPRESSION: 1. Improved appearance of the paramedian frontal and cingulate gyrus signal abnormality, with resolved restricted diffusion and decreased enhancement. 2. Punctate signal abnormality in the medulla with interval decreased diffusion abnormality and stable to slightly increased enhancement. 3. No new intracranial abnormality. 4. Decreased patchy T2 signal abnormality and expansion throughout the cervical and thoracic spinal cord. Cord enhancement from C2-C4 has increased, while cord enhancement elsewhere has improved. 5. No new cord lesions. Electronically Signed   By: Logan Bores M.D.   On: 10/03/2016 07:40   Mr Thoracic Spine W Wo Contrast  Result Date: 09/20/2016 CLINICAL DATA:  Acute on chronic low back pain radiating to buttocks and LEFT lower extremity with numbness and tingling. On Vicodin with minimal relief. EXAM: MRI CERVICAL, THORACIC SPINE WITH AND WITHOUT CONTRAST MR LUMBAR SPINE WITHOUT TECHNIQUE: Multiplanar and multiecho pulse sequences of the cervical spine, to include the craniocervical junction and cervicothoracic junction, and thoracic with and without contrast. Multiplanar and multi echo pulse sequences of the lumbar spine, were obtained without intravenous contrast. CONTRAST:  15 cc MultiHance COMPARISON:  CT chest May 19, 2014 and MRI of lumbar spine February 26, 2008 FINDINGS: MRI CERVICAL SPINE FINDINGS ALIGNMENT: Straightened cervical lordosis.  No malalignment. VERTEBRAE/DISCS: Vertebral bodies are intact. Moderate to severe C3-4 thru C6-7 disc height loss of with moderate chronic discogenic endplate changes and disc desiccation. No suspicious bone marrow signal. No suspicious osseous or intradiscal enhancement. CORD:Expansile  T2 bright signal and patchy varying enhancement predominately involving the posterior columns C2 through C4-5 and more central within the spinal cord at C5 through C7-T1. No myelomalacia. No syrinx. No abnormal leptomeningeal or epidural enhancement. POSTERIOR FOSSA, VERTEBRAL ARTERIES, PARASPINAL TISSUES: No MR findings of ligamentous injury. Vertebral artery flow voids present. Included posterior fossa and paraspinal soft tissues are normal. DISC LEVELS: C2-3: Small central disc protrusion. Mild facet arthropathy without canal stenosis or neural foraminal narrowing. C3-4: 2 mm broad-based disc bulge, uncovertebral hypertrophy and mild facet arthropathy. Mild canal stenosis. Moderate RIGHT, mild to moderate LEFT neural foraminal narrowing. C4-5: Annular bulging, uncovertebral hypertrophy and mild facet arthropathy. Mild canal stenosis. Moderate RIGHT and moderate to severe LEFT neural foraminal narrowing. C5-6: Broad-based disc bulge and LEFT central disc protrusion in total measure 3 mm in AP dimension. Moderate to severe canal stenosis, AP dimension the canal is 6 mm. Ventral cord deformity. Severe RIGHT and moderate to severe LEFT neural foraminal narrowing. C6-7: 2 mm broad-based disc bulge, uncovertebral hypertrophy and mild facet arthropathy. Moderate canal stenosis. Moderate to severe bilateral neural foraminal narrowing. C7-T1: No disc bulge, canal stenosis nor neural foraminal narrowing. MRI THORACIC SPINE FINDINGS ALIGNMENT: Maintenance of the thoracic kyphosis. No malalignment. VERTEBRAE/DISCS: Vertebral bodies are intact. Old minimally displaced sternal fracture. Intervertebral discs morphology and signal are normal. Multilevel mild chronic discogenic endplate changes/spurring. Moderate subacute on chronic discogenic endplate changes [id number]. Heterogeneous T1, bright STIR probable hemangioma T2, 10 mm. Additional large L1 hemangioma. No suspicious osseous or intradiscal enhancement. CORD: Patchy mildly  expansile central spinal cord T2 hyperintense signal and varying enhancement T1-2 through T2, T5-6 through T7-8 with predominant dorsal cord involvement. Patchy T2 hyperintense signal within the conus medullaris. No syrinx. No abnormal leptomeningeal or epidural enhancement. No myelomalacia. PREVERTEBRAL AND PARASPINAL SOFT TISSUES:  Normal. DISC LEVELS: At T5-6 is 3 mm RIGHT central disc protrusion/extrusion  without migration, contacting the ventral spinal cord without canal stenosis or neural foraminal narrowing at any level. MRI LUMBAR SPINE FINDINGS SEGMENTATION: For the purposes of this report, the last well-formed intervertebral disc will be described as L5-S1. ALIGNMENT: Maintenance of the lumbar lordosis. Grade 1 L4-5 anterolisthesis is similar, no spondylolysis. VERTEBRAE:Vertebral bodies are intact. Mild L4-5 and L5-S1 disc height loss is similar with decreased T2 signal within the lower lumbar disc compatible with mild desiccation. Mild chronic discogenic endplate changes [id number] and L5-S1. No abnormal bone marrow signal. No suspicious bone marrow signal. Large L1 hemangioma with additional scattered small hemangiomas. CONUS MEDULLARIS: Conus medullaris terminates at L1-2. Cauda equina is normal. PARASPINAL AND SOFT TISSUES: Extremely distended urinary bladder. DISC LEVELS: L1-2: Small broad-based LEFT central disc protrusion, interval re- absorption of extrusion. No canal stenosis or neural foraminal narrowing. L2-3: No disc bulge, canal stenosis nor neural foraminal narrowing. L3-4: Annular bulging. Moderate facet arthropathy and ligamentum flavum redundancy with trace facet effusions which are likely reactive. No canal stenosis. Mild neural foraminal narrowing. L4-5: Anterolisthesis. Small broad-based disc bulge. Severe facet arthropathy and ligamentum flavum redundancy with trace facet effusions which are likely reactive. Mild canal stenosis including partial effacement lateral recesses which may affect  the traversing L5 nerves. Moderate to severe RIGHT, moderate LEFT neural foraminal narrowing. L5-S1: Small broad-based disc bulge. Moderate to severe RIGHT, moderate LEFT facet arthropathy. Trace RIGHT facet effusion is likely reactive. No canal stenosis. Mild LEFT neural foraminal narrowing. IMPRESSION: MRI CERVICAL SPINE: Abnormal spinal cord signal and enhancement without discrete mass. 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 Recommend correlation with cerebral spinal fluid studies. Degenerative cervical spine results in moderate to severe canal stenosis C5-6, moderate at C6-7. Neural foraminal narrowing C3-4 thru C6-7: Severe on the RIGHT at C5-6, moderate to severe multiple levels. MRI THORACIC SPINE: Abnormal spinal cord signal and enhancement without discrete mass. 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 Recommend correlation with cerebral spinal fluid studies. Small T5-6 disc protrusion/extrusion without canal stenosis or neural foraminal narrowing. MRI LUMBAR SPINE: Re- absorption of L1-2 disc extrusion with small residual protrusion. Degenerative lumbar spine resulting in mild canal stenosis L4-5, similar grade 1 L4-5 anterolisthesis on degenerative basis. Neural foraminal narrowing L3-4 through L5-S1: Moderate to severe on the RIGHT at L4-5. Preliminary acute findings discussed with and reconfirmed by PA.JAIME WARD on 09/20/2016 at 5:35 pm. Electronically Signed   By: Elon Alas M.D.   On: 09/20/2016 19:19   Mr Lumbar Spine W Wo Contrast (assess For Abscess, Cord Compression)  Result Date: 10/03/2016 CLINICAL DATA:  Recent hospitalization for HSV encephalitis and myelitis. Progressive decline in functional status with worsening weakness, decreased ability to walk, involuntary hand movements, and loss of coordination. Numbness from the nipple line down. EXAM: MRI HEAD WITHOUT AND WITH CONTRAST MRI CERVICAL SPINE WITHOUT AND WITH CONTRAST MRI THORACIC SPINE WITHOUT  AND WITH CONTRAST MRI LUMBAR SPINE WITHOUT AND WITH CONTRAST TECHNIQUE: Multiplanar, multiecho pulse sequences of the brain and surrounding structures, cervical spine, to include the craniocervical junction and cervicothoracic junction, and thoracic and lumbar spine were obtained without and with intravenous contrast. CONTRAST:  13 mL MultiHance COMPARISON:  Brain MRI 09/23/2016.  Total spine MRI 09/21/2015. FINDINGS: MRI HEAD FINDINGS Brain: Cortically based signal abnormality in the paramedian frontal and cingulate gyri has overall improved. There is no residual diffusion abnormality in this region, and only minimal cortical enhancement remains, greater on the left (series 47, images 30 and 35 and series 48, image 25). Abnormal  FLAIR signal in this region is similar to the prior study (series 6, images 16 and 18). No abnormal leptomeningeal enhancement is identified. There is a punctate focus of subtly increased trace diffusion signal in the inferior ventral medulla right of midline, decreased from prior. There is associated enhancement in the medulla (best seen on the cervical spine examination, series 45, image 8). No new regions of signal abnormality are identified in the brain. There is no evidence of acute infarct, intracranial hemorrhage, mass, midline shift, or extra-axial fluid collection. Patchy T2 hyperintensities in the cerebral white matter and pons are unchanged and nonspecific, moderate for age. Vascular: Major intracranial vascular flow voids are preserved. Skull and upper cervical spine: Unremarkable bone marrow signal. Sinuses/Orbits: Unchanged small right mastoid effusion. Clear paranasal sinuses. Bilateral cataract extraction. Other: None. MRI CERVICAL SPINE FINDINGS Alignment: Straightening and slight reversal of the normal cervical lordosis. No listhesis. Vertebrae: No evidence of fracture or suspicious osseous lesion. C3-C7 degenerative endplate changes. Cord: Abnormal T2 hyperintensity  within the cord from C2-C7 has decreased in intensity, and cord expansion has decreased. Associated enhancement in the cord from C2-C4 has increased from the prior study and is more confluent with greatest involvement of the dorsal cord. More central cord enhancement at C6-7 has mildly decreased. Posterior Fossa, vertebral arteries, paraspinal tissues: Small focus of enhancement in the inferior ventral medulla may have minimally increased. Disc levels: Axial images are motion degraded, particularly the MERGE sequence. Disc degeneration does not appear significantly changed from the recent prior examination, most notable at C5-6 where a broad-based posterior disc osteophyte complex with more focal left paracentral component results in moderate to severe spinal stenosis with mild-to-moderate cord flattening and severe right and moderate to severe left neural foraminal stenosis. MRI THORACIC SPINE FINDINGS Alignment:  Normal. Vertebrae: No evidence of thoracic spine fracture. 10 mm T2 hyperintense focus in the T2 vertebral body is unchanged and may represent an atypical hemangioma. Cord: Patchy T2 hyperintensity within the spinal cord at T2, T6, and T7-8 has decreased. The cord still appears slightly expanded at T7-8. There is persistent enhancement in each of these areas of T2 signal abnormality, however the extent and intensity of the enhancement have decreased. No new cord lesions are identified. Paraspinal and other soft tissues: Unremarkable. Disc levels: Unchanged right central disc protrusion at T5-6 contacting and slightly flattening the ventral spinal cord without significant stenosis. MRI LUMBAR SPINE FINDINGS Segmentation:  Standard. Alignment: Facet mediated grade 1 anterolisthesis of L4 on L5, unchanged. Vertebrae: No evidence of fracture or suspicious osseous lesion. Large L1 vertebral body hemangioma. Conus medullaris: Extends to the L1-2 level and appears normal. The cauda equina is unremarkable. No  abnormal intradural enhancement or nerve root thickening. Paraspinal and other soft tissues: Prominent bladder distention though less than on the prior MRI. Disc levels: Unchanged lumbar disc and facet degeneration compared to the recent prior examination, most notable at L4-5 where there is anterolisthesis with bulging uncovered disc and advanced facet arthrosis resulting in mild spinal stenosis, moderate bilateral lateral recess stenosis, and moderate bilateral neural foraminal stenosis. IMPRESSION: 1. Improved appearance of the paramedian frontal and cingulate gyrus signal abnormality, with resolved restricted diffusion and decreased enhancement. 2. Punctate signal abnormality in the medulla with interval decreased diffusion abnormality and stable to slightly increased enhancement. 3. No new intracranial abnormality. 4. Decreased patchy T2 signal abnormality and expansion throughout the cervical and thoracic spinal cord. Cord enhancement from C2-C4 has increased, while cord enhancement elsewhere has improved. 5. No new  cord lesions. Electronically Signed   By: Logan Bores M.D.   On: 10/03/2016 07:40   Dg Fluoro Guide Lumbar Puncture  Result Date: 09/22/2016 CLINICAL DATA:  Myelitis. EXAM: DIAGNOSTIC LUMBAR PUNCTURE UNDER FLUOROSCOPIC GUIDANCE FLUOROSCOPY TIME:  Fluoroscopy Time:  12 seconds Radiation Exposure Index (if provided by the fluoroscopic device): 21.25 microGray*m^2 Number of Acquired Spot Images: 0 PROCEDURE: Informed consent was obtained from the patient prior to the procedure, including potential complications of headache, allergy, and pain. With the patient prone, the lower back was prepped with Betadine. 1% Lidocaine was used for local anesthesia. Lumbar puncture was performed at the L3-4 level using a 3.5 inch 20 gauge needle via a right interlaminar approach with return of clear CSF with an opening pressure of 9 cm water (measured in the left lateral decubitus position). 12 ml of CSF were  obtained for laboratory studies. The patient tolerated the procedure well and there were no apparent complications. IMPRESSION: Successful fluoroscopic guided lumbar puncture. Electronically Signed   By: Logan Bores M.D.   On: 09/22/2016 18:21    Microbiology: No results found for this or any previous visit (from the past 240 hour(s)).   Labs: Basic Metabolic Panel:  Recent Labs Lab 10/02/16 2153 10/08/16 1845  NA 136  --   K 3.8  --   CL 98*  --   CO2 25  --   GLUCOSE 126*  --   BUN 12  --   CREATININE 0.86 0.74  CALCIUM 8.5*  --    Liver Function Tests:  Recent Labs Lab 10/02/16 2153  AST 19  ALT 22  ALKPHOS 69  BILITOT 0.4  PROT 5.2*  ALBUMIN 2.7*   No results for input(s): LIPASE, AMYLASE in the last 168 hours. No results for input(s): AMMONIA in the last 168 hours. CBC:  Recent Labs Lab 10/02/16 2153 10/03/16 0719 10/08/16 1845  WBC 8.2 6.3 8.7  NEUTROABS 4.7 5.7  --   HGB 12.9 13.3 11.7*  HCT 39.7 40.2 35.2*  MCV 96.8 94.4 93.4  PLT 127* 135* 154   Cardiac Enzymes: No results for input(s): CKTOTAL, CKMB, CKMBINDEX, TROPONINI in the last 168 hours. BNP: BNP (last 3 results) No results for input(s): BNP in the last 8760 hours.  ProBNP (last 3 results) No results for input(s): PROBNP in the last 8760 hours.  CBG: No results for input(s): GLUCAP in the last 168 hours.     Signed:  Velvet Bathe MD.  [facility] 10/09/2016, 9:02 AM

## 2016-10-09 NOTE — Progress Notes (Signed)
Physical Therapy Session Note  Patient Details  Name: Katelyn Lamb MRN: [id number] Date of Birth: Jan 12, 1946  Today's Date: 10/09/2016 PT Individual Time: 1400-1500 PT Individual Time Calculation (min): 60 min   Short Term Goals: Week 1:  PT Short Term Goal 1 (Week 1): Pt will consistently perform functional transers iwth min A PT Short Term Goal 2 (Week 1): pt with gait in controlled enivonrment iwht min A 50'  Skilled Therapeutic Interventions/Progress Updates: Pt presented in bed c/o back pain from earlier sessions, pt advised received pain meds from nsg prior to session. Performed supine to sit with mod/maxA.  Sit to stand from bed with modA, pt noted to brace legs against bed and increased posterior lean upon standing.  Second attempt pt with improved wt shift and decreased sway. Pt ambulated in room with HHA cues for increasing BoS with good carryover. Transported to rehab gym via w/c for pain management. Pt participated in sit to/from stand w/c to mat transfers x 4 with cues for increasing forward wt shift with improved carryover. Pt with c/o diaphoresis which pt attributes to pain. Resolved after seated rest. Returned to room due to pt's urgency to use toilet. Performed stand pivot w/c to toilet with modA (+void). Pt ambulated back to bed from toilet with HHA with improved technique. Performed standing and sitting static balance with UE reaching for coordination. Pt able to reach target with approx 60% accuracy, with x1 LOB during sitting balance. Pt returned to supine with modA for trunk control and LE placement. Pt left in bed with husband present and all current needs met.   Therapy Documentation  Precautions:  Precautions Precautions: Fall Restrictions Weight Bearing Restrictions: No Pain: Pain Assessment Pain Assessment: 0-10 Pain Score: 5  Pain Type: Acute pain Pain Location: Back Pain Descriptors / Indicators: Aching Pain Frequency: Constant Pain Onset: On-going Patients  Stated Pain Goal: 2 Pain Intervention(s): Medication (See eMAR)   See Function Navigator for Current Functional Status.   Therapy/Group: Individual Therapy  Kymberli Wiegand  Pretty Weltman, PTA  10/09/2016, 3:35 PM

## 2016-10-09 NOTE — Evaluation (Signed)
Physical Therapy Assessment and Plan  Patient Details  Name: Katelyn Lamb MRN: [id number] Date of Birth: Sep 02, 1946  PT Diagnosis: Abnormality of gait, Coordination disorder, Difficulty walking, Impaired sensation and Muscle weakness Rehab Potential: Good ELOS: 14-17days   Today's Date: 10/09/2016 PT Individual Time: 3007-6226 PT Individual Time Calculation (min): 55 min    Problem List:  Patient Active Problem List   Diagnosis Date Noted  . Incomplete paraplegia ([place])   . Acute blood loss anemia   . Neurogenic bladder   . 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 Neuropathic pain   . Muscle spasm   . Gastroesophageal reflux disease   . Slow transit constipation   . Thrombocytopenia ([place]) 10/03/2016  . Anxiety 10/03/2016  . Abnormal MRI, spinal cord   . Encephalitis and encephalomyelitis   . Myelitis ([place])   . Numbness   . Intractable back pain 09/20/2016  . Numbness of left lower extremity 09/20/2016  . Hyponatremia 09/20/2016  . Herpes zoster 09/20/2016    Past Medical History:  Past Medical History:  Diagnosis Date  . Anxiety   . Back pain   . 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 ABDOMINAL HYSTERECTOMY    . BLADDER REPAIR    . CESAREAN SECTION    . TUBAL LIGATION      Assessment & Plan Clinical Impression: Patient is a 71 y.o. year old female with recent admission to the hospital on 10/03/2016 due to her increasing weakness. MRI of the brain reviewed, no new intracranial abnormality. MRI of the spine showed improved appearance of the paramedian frontal and cingulate gyrus signal abnormality with resolved restricted diffusion and decreased enhancement. Decreased patchy T2 signal abnormality and expansion throughout the cervical and thoracic cord. Cord enhancement from C2-C4 had increased. Neurology follow-up placed on intravenous Solu-Medrol and transitioned to PO with taper as directed.  Infectious disease follow-up advised to complete 21 day course of  IV acyclovir .  Patient transferred to CIR on 10/08/2016 .   Patient currently requires max with mobility secondary to muscle weakness, impaired timing and sequencing, ataxia and decreased coordination and decreased standing balance, decreased postural control and decreased balance strategies.  Prior to hospitalization, patient was independent  with mobility and lived with Spouse in a House home.  Home access is 2Stairs to enter.  Patient will benefit from skilled PT intervention to maximize safe functional mobility, minimize fall risk and decrease caregiver burden for planned discharge home with 24 hour supervision.  Anticipate patient will benefit from follow up [place] at discharge.  PT - End of Session Activity Tolerance: Tolerates 30+ min activity with multiple rests Endurance Deficit: Yes PT Assessment Rehab Potential (ACUTE/IP ONLY): Good PT Patient demonstrates impairments in the following area(s): Balance;Endurance;Motor;Sensory PT Transfers Functional Problem(s): Bed Mobility;Bed to Chair;Car;Furniture;Floor PT Locomotion Functional Problem(s): Ambulation;Stairs PT Plan PT Intensity: Minimum of 1-2 x/day ,45 to 90 minutes PT Frequency: 5 out of 7 days PT Duration Estimated Length of Stay: 14-17days PT Treatment/Interventions: Ambulation/gait training;Discharge planning;DME/adaptive equipment instruction;Functional mobility training;Pain management;Splinting/orthotics;Therapeutic Activities;UE/LE Strength taining/ROM;UE/LE Coordination activities;Therapeutic Exercise;Stair training;Patient/family education;Neuromuscular re-education;Functional electrical stimulation;Community reintegration;Balance/vestibular training PT Transfers Anticipated Outcome(s): supervision PT Locomotion Anticipated Outcome(s): supervision gait PT Recommendation Follow Up Recommendations: Home health PT;Outpatient PT Patient destination:  Home Equipment Recommended: To be determined  Skilled Therapeutic Intervention Eval performed and pt/husband educated on PT goals and PT POC. Pt performed supine to sit with supervision  in hospital bed, min A for sit to supine for LE control.  Stand pivot transfers multiple reps throughout treatment initially with mod A, HHA, declined to max A with fatigue at end of session.  Gait with HHA x 80' with min A, UE ataxia makes pt unable to use RW or propel w/c.  Stair negotiation with 2 rails with max A due to pt focused on UE grasp on stairs and not attending to balance and LEs.  Car transfer to simulated sedan height car with mod A. Pt limited by dysmetria and ataxia of bilat UEs > LEs.  Good LE strength but decreased activity tolerance.  PT Evaluation Precautions/Restrictions Precautions Precautions: Fall Restrictions Weight Bearing Restrictions: No Pain Pain Assessment Pain Assessment: No/denies pain  Home Living/Prior Functioning Home Living Available Help at Discharge: Family;Available 24 hours/day Type of Home: House Home Access: Stairs to enter [employer] of Steps: 2 Entrance Stairs-Rails: None Home Layout: Two level;Able to live on main level with bedroom/bathroom Bathroom Shower/Tub: Tub/shower unit;Curtain Bathroom Toilet: Standard Additional Comments: Pt able to enter downstairs bathroom sideways with RW for toileting but likely unable to shower due to accessibility of bathroom with RW  Lives With: Spouse Prior Function Level of Independence: Independent with basic ADLs;Independent with transfers;Independent with gait  Able to Take Stairs?: Yes Driving: Yes  Cognition Overall Cognitive Status: Within Functional Limits for tasks assessed Arousal/Alertness: Awake/alert Orientation Level: Oriented X4 Sensation Sensation Light Touch: Impaired Detail Light Touch Impaired Details: Impaired RUE;Impaired LUE;Impaired RLE;Impaired LLE Stereognosis: Not  tested Hot/Cold: Not tested Proprioception: Impaired Detail Proprioception Impaired Details: Impaired RUE;Impaired LUE;Impaired RLE;Impaired LLE Additional Comments: B UEs with greater impairment Coordination Gross Motor Movements are Fluid and Coordinated: No Fine Motor Movements are Fluid and Coordinated: No Coordination and Movement Description: dysmetria, uncoordinated movements, ataxia Motor  Motor Motor: Ataxia;Abnormal postural alignment and control Motor - Skilled Clinical Observations: dysmetira, ataxia   Trunk/Postural Assessment  Cervical Assessment Cervical Assessment: Within Functional Limits Thoracic Assessment Thoracic Assessment: Within Functional Limits Lumbar Assessment Lumbar Assessment: Within Functional Limits Postural Control Postural Control: Deficits on evaluation Trunk Control: impaired, posterior biax Righting Reactions: delayed  Balance Dynamic Standing Balance Dynamic Standing - Comments: max A for static standing balance, mod A for gait Extremity Assessment      RLE Assessment RLE Assessment: Within Functional Limits LLE Assessment LLE Assessment: Within Functional Limits   See Function Navigator for Current Functional Status.   Refer to Care Plan for Long Term Goals  Recommendations for other services: Therapeutic Recreation  Outing/community reintegration  Discharge Criteria: Patient will be discharged from PT if patient refuses treatment 3 consecutive times without medical reason, if treatment goals not met, if there is a change in medical status, if patient makes no progress towards goals or if patient is discharged from hospital.  The above assessment, treatment plan, treatment alternatives and goals were discussed and mutually agreed upon: by patient and by family  DONAWERTH,KAREN 10/09/2016, 11:26 AM

## 2016-10-09 NOTE — Progress Notes (Signed)
**  Preliminary report by tech**  Bilateral lower extremity venous duplex complete. There is evidence of acute deep vein thrombosis involving the peroneal veins of the left lower extremity. There is no evidence of superficial vein thrombosis involving the left lower extremity. There is no evidence of deep or superficial vein thrombosis involving the right lower extremity. There is no evidence of a Baker's cyst bilaterally. Results were given to the patient's nurse, Mason.  10/09/16 5:23 PM Katelyn Lamb RVT

## 2016-10-09 NOTE — Progress Notes (Signed)
[facility]     PROGRESS NOTE  Subjective/Complaints:  Pt seen sitting up in bed this AM, husband at bedside.  Pt states she slept great overnight.  She notes a tightness around her abdomen that improved after she was cathed.   ROS: +Abd tightness. Denies CP, SOB, N/V/D.  Objective: Vital Signs: Blood pressure 131/71, pulse 64, temperature 98.7 F (37.1 C), temperature source Oral, resp. rate 18, weight 64.2 kg (141 lb 8 oz), SpO2 97 %. No results found.  Recent Labs  10/08/16 1845  WBC 8.7  HGB 11.7*  HCT 35.2*  PLT 154    Recent Labs  10/08/16 1845  CREATININE 0.74   CBG (last 3)  No results for input(s): GLUCAP in the last 72 hours.  Wt Readings from Last 3 Encounters:  10/08/16 64.2 kg (141 lb 8 oz)  10/02/16 63.5 kg (140 lb)  09/20/16 65 kg (143 lb 3.2 oz)    Physical Exam:  BP 131/71 (BP Location: Left Arm)   Pulse 64   Temp 98.7 F (37.1 C) (Oral)   Resp 18   Wt 64.2 kg (141 lb 8 oz)   SpO2 97%   BMI 22.84 kg/m  Constitutional: She appears well-developed. Frail  HENT: Normocephalic and atraumatic.  Eyes: EOM are normal. No discharge.  Cardiovascular: Normal rate, regular rhythm. No JVD.  Respiratory: Effort normal and breath sounds normal. GI: Soft. Bowel sounds are normal.  Musculoskeletal: She exhibits no edema or tenderness.  Neurological: She is alert and oriented.  Bilateral sensory loss distal to neck  Bilateral ataxia  Motor: B/l UE: 4-/5 proximal to distal  B/l LE: 4/5 proximal to distal Skin: Skin is warm and dry.  Psychiatric: She has a normal mood and affect. Slightly anxious    Assessment/Plan: 1. Functional deficits secondary to incomplete paraplegia which require 3+ hours per day of interdisciplinary therapy in a comprehensive inpatient rehab setting. Physiatrist is providing close team supervision and 24 hour management of active medical problems listed below. Physiatrist and rehab team continue  to assess barriers to discharge/monitor patient progress toward functional and medical goals.  Function:  Bathing Bathing position      Bathing parts      Bathing assist        Upper Body Dressing/Undressing Upper body dressing                    Upper body assist        Lower Body Dressing/Undressing Lower body dressing                                  Lower body assist        Toileting Toileting     Toileting steps completed by helper: Adjust clothing prior to toileting, Performs perineal hygiene, Adjust clothing after toileting Toileting Assistive Devices: Toilet aid  Toileting assist Assist level: Two helpers   Transfers Chair/bed Physiological scientist Comprehension Comprehension assist level: Understands basic 75 - 89% of the time/ requires cueing 10 - 24% of the time  Expression Expression assist level: Expresses basic 75 - 89% of the time/requires cueing 10 - 24% of the time. Needs helper to occlude trach/needs to repeat words.  Social Interaction Social Interaction assist level: Interacts appropriately 75 - 89% of the time - Needs redirection for appropriate language or to initiate interaction.  Problem Solving Problem solving assist level: Solves basic 75 - 89% of the time/requires cueing 10 - 24% of the time  Memory Memory assist level: Recognizes or recalls 75 - 89% of the time/requires cueing 10 - 24% of the time    Medical Problem List and Plan: 1.  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   Complete Zovirax as directed per infectious disease.  Prednisone taper as directed  Begin CIR 2.  DVT Prophylaxis/Anticoagulation: Subcutaneous Lovenox. Monitor platelet counts and any signs of bleeding.    Vascular study pending 3. Pain Management: Neurontin 100 mg 3 times a day, Valium 5 mg every 4 hours as needed muscle  spasms, hydrocodone as needed 4. Mood: Xanax 1 mg daily as needed 5. Neuropsych: This patient is capable of making decisions on her own behalf. 6. Skin/Wound Care: Routine skin checks 7. Fluids/Electrolytes/Nutrition: Routine I&Os 8. GERD. Protonix 9. Constipation. Laxative assistance. 9. ABLA  Hb 11.7 on 2/5  Cont to monitor 10. Neurogenic bladder  Monitor with I/O caths PRN  PVRs/Bladder scans ordered 11. Abd muscle spasms  Will consider baclofen  LOS (Days) 1 A FACE TO FACE EVALUATION WAS PERFORMED  Katelyn Lamb Lorie Phenix 10/09/2016 9:07 AM

## 2016-10-10 ENCOUNTER — Inpatient Hospital Stay (HOSPITAL_COMMUNITY): Payer: PPO | Admitting: Occupational Therapy

## 2016-10-10 ENCOUNTER — Inpatient Hospital Stay (HOSPITAL_COMMUNITY): Payer: PPO | Admitting: Physical Therapy

## 2016-10-10 DIAGNOSIS — R300 Dysuria: Secondary | ICD-10-CM

## 2016-10-10 DIAGNOSIS — I82432 Acute embolism and thrombosis of left popliteal vein: Secondary | ICD-10-CM

## 2016-10-10 LAB — URINALYSIS, ROUTINE W REFLEX MICROSCOPIC
Bilirubin Urine: NEGATIVE
Glucose, UA: NEGATIVE mg/dL
KETONES UR: NEGATIVE mg/dL
Nitrite: POSITIVE — AB
PH: 7 (ref 5.0–8.0)
Protein, ur: NEGATIVE mg/dL
SPECIFIC GRAVITY, URINE: 1.009 (ref 1.005–1.030)

## 2016-10-10 MED ORDER — RIVAROXABAN 15 MG PO TABS
15.0000 mg | ORAL_TABLET | Freq: Two times a day (BID) | ORAL | Status: DC
  Administered 2016-10-10 – 2016-10-26 (×33): 15 mg via ORAL
  Filled 2016-10-10 (×35): qty 1

## 2016-10-10 MED ORDER — RIVAROXABAN 20 MG PO TABS: 20.0000 mg | ORAL_TABLET | Freq: Every day | ORAL | Status: DC

## 2016-10-10 NOTE — Progress Notes (Signed)
Occupational Therapy Session Note  Patient Details  Name: Katelyn Lamb MRN: [id number] Date of Birth: Jan 22, 1946  Today's Date: 10/10/2016 OT Individual Time: 0702-0800 and [id number] OT Individual Time Calculation (min): 58 min and 25 min    Short Term Goals: Week 1:  OT Short Term Goal 1 (Week 1): Pt will perform toileting with mod A in order to decrease level of assistance with self care. OT Short Term Goal 2 (Week 1): Pt will perform LB dressing with mod A in order to increase I with self care. OT Short Term Goal 3 (Week 1): Pt will demonstrate mod A for standing balance during LB clothing management.  OT Short Term Goal 4 (Week 1): Pt will engage in 10 minutes of functional task with 2 rest breaks or less.   Skilled Therapeutic Interventions/Progress Updates:  Session 1: Upon entering the room, pt seated on [facility] with RN present in the room. Pt reports, "I feel that band around my chest again and I can't feel anything in my feet this morning." Pt needing increased time and cues for concentration to void this session. Pt able to perform hygiene from seated position with R hand. Pt standing with min-mod A standing balance for LB clothing management. Pt returned to sitting EOB with breakfast tray in front of her. OT presented pt with weighted mug cup for fluids in order to increase pts independence with bringing drink to mouth. OT provided set up A to open containers and hand over hand assistance to scoop food with built up utensil and bring to mouth. After, 50% of meal finished pt reduced level of assist until pt demonstrated ability to feed self 5-6 bites. Movements still uncoordinated but decreased spillage noted from utensil and pt able to get to mouth. Pt returned to bed at end of session with call bell and all needed items within reach upon exiting the room.   Session 2: Upon entering the room, pt's husband present for observation. Pt with 2/10 c/o soreness in lower back but agreeable to OT  intervention. Pt reporting need for toileting with mod A for sit <>Stand and mod A hand held ambulation 10' to bathroom. Pt needing lifting and lowering assistance onto toilet but able to perform hygiene. Pt unable to coordinate movements in UEs in order to pull pants over hips. Pt ambulated to sink to wash hands with initial hand over hand assistance with mod A for standing balance during task. Pt returned to sit on EOB and therapist placed 1 lb wrist weights on B UEs and practiced hand to mouth movements for feeding. Pt's movements much more coordinated than prior session. Pt able to touch nose on multiple occassions. Pt returned to supine at end of session secondary to fatigue. Call bell and all needed items within reach upon exiting the room.    Therapy Documentation Precautions:  Precautions Precautions: Fall Restrictions Weight Bearing Restrictions: No General:   Vital Signs:   Pain:   ADL:   Exercises:   Other Treatments:    See Function Navigator for Current Functional Status.   Therapy/Group: Individual Therapy    [name] 10/10/2016, 10:50 AM

## 2016-10-10 NOTE — Progress Notes (Signed)
Social Work  Social Work Assessment and Plan  Patient Details  Name: Katelyn Lamb MRN: [id number] Date of Birth: 04/28/1946  Today's Date: 10/10/2016  Problem List:  Patient Active Problem List   Diagnosis Date Noted  . Dysuria   . Acute deep vein thrombosis (DVT) of popliteal vein of left lower extremity ([place])   . Incomplete paraplegia ([place])   . Acute blood loss anemia   . Neurogenic bladder   . 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 Neuropathic pain   . Muscle spasm   . Gastroesophageal reflux disease   . Slow transit constipation   . Thrombocytopenia ([place]) 10/03/2016  . Anxiety 10/03/2016  . Abnormal MRI, spinal cord   . Encephalitis and encephalomyelitis   . Myelitis ([place])   . Numbness   . Intractable back pain 09/20/2016  . Numbness of left lower extremity 09/20/2016  . Hyponatremia 09/20/2016  . Herpes zoster 09/20/2016   Past Medical History:  Past Medical History:  Diagnosis Date  . Anxiety   . Back pain   . 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 ABDOMINAL HYSTERECTOMY    . BLADDER REPAIR    . CESAREAN SECTION    . TUBAL LIGATION     Social History:  reports that she has quit smoking. She has never used smokeless tobacco. She reports that she does not drink alcohol or use drugs.  Family / Support Systems Marital Status: Married How Long?: 27 yrs (2nd marriage for both) Patient Roles: Spouse, Parent Spouse/Significant Other: Annie Main Serfass @ (H) [phone] or [phone] Children: daughter, Raynald Blend @ [phone];  son, Beverlyn Bourgoin @ (C) [phone] - both live locally Other Supports: grandaughter, Ellard Artis @ [phone] Anticipated Caregiver: spouse, children and grandchildren Ability/Limitations of Caregiver: spouse can provide min assist Caregiver Availability: 24/7 Family Dynamics: Husband at bedside and very supportive.  They are laughing easily together and  really share in completion of the interview.  Social History Preferred language: English Religion: Christian Cultural Background: NA Read: Yes Write: Yes Employment Status: Retired Freight forwarder Issues: None Guardian/Conservator: None - per MD, pt is capable of making decisions on his own behalf.   Abuse/Neglect Physical Abuse: Denies Verbal Abuse: Denies Sexual Abuse: Denies Exploitation of patient/patient's resources: Denies Self-Neglect: Denies  Emotional Status Pt's affect, behavior adn adjustment status: Pt pleasant and talkative as she denies any significant emotional distress.  She does admit to having had "a few pity parties for myself....but nothing terrible."  She and husband report much frustration with the length of time it took to get correct diagnosis.  Despite no endorsement of significant mood issues, she may benefit from neuropsychology consult for mood and cognitive screening. Recent Psychosocial Issues: None Pyschiatric History: None Substance Abuse History: None  Patient / Family Perceptions, Expectations & Goals Pt/Family understanding of illness & functional limitations: Pt and spouse with good understanding of her diagnosis, treatment to date and current functional limitations/ need for CIR. Premorbid pt/family roles/activities: pt was independent prior to early Jan Anticipated changes in roles/activities/participation: Spouse to continue providing caregiver assistance as he was PTA. Pt/family expectations/goals: "I hope I can get to where he doesn't have to help me!"  [employer]: None Premorbid Home Care/DME Agencies: Other (Comment) ([facility] following PTA) Transportation available at discharge: yes Resource referrals recommended: Neuropsychology  Discharge Planning Living Arrangements: Spouse/significant other Support Systems: Spouse/significant  other, Children, Friends/neighbors, Other relatives Type of Residence:  Private residence Insurance Resources: Multimedia programmer (specify) ([employer]) Financial Resources: Radio broadcast assistant Screen Referred: No Living Expenses: Higher education careers adviser Management: Spouse Does the patient have any problems obtaining your medications?: No Home Management: Pt and spouse Patient/Family Preliminary Plans: Pt to return home with husband as primary support. Social Work Anticipated Follow Up Needs: HH/OP Expected length of stay: ELOS 13-18 days  Clinical Impression Pleasant woman with spouse at bedside.  Here following diagnosis and treatment for myelitis due to HSV and with significant weakness and ataxia.  Spouse very supportive and both are optimistic about rehab program.  Pt denies any significant emotional distress, however, admits to "having a few pity parties..."  Will follow for support and d/c planning needs.  Leib Elahi 10/10/2016, 3:53 PM

## 2016-10-10 NOTE — Care Management Note (Signed)
[facility] Individual Statement of Services  Patient Name:  Katelyn Lamb  Date:  10/10/2016  Welcome to the [facility].  Our goal is to provide you with an individualized program based on your diagnosis and situation, designed to meet your specific needs.  With this comprehensive rehabilitation program, you will be expected to participate in at least 3 hours of rehabilitation therapies Monday-Friday, with modified therapy programming on the weekends.  Your rehabilitation program will include the following services:  Physical Therapy (PT), Occupational Therapy (OT), Speech Therapy (ST), 24 hour per day rehabilitation nursing, Therapeutic Recreaction (TR), Neuropsychology, Case Management (Social Worker), Rehabilitation Medicine, Nutrition Services and Pharmacy Services  Weekly team conferences will be held on Wednesdays to discuss your progress.  Your Social Worker will talk with you frequently to get your input and to update you on team discussions.  Team conferences with you and your family in attendance may also be held.  Expected length of stay: 14-17 days  Overall anticipated outcome: supervision  Depending on your progress and recovery, your program may change. Your Social Worker will coordinate services and will keep you informed of any changes. Your Social Worker's name and contact numbers are listed  below.  The following services may also be recommended but are not provided by the [facility] will be made to provide these services after discharge if needed.  Arrangements include referral to agencies that provide these services.  Your insurance has been verified to be:  [employer] Your primary doctor is:  Dr. Dorthy Cooler  Pertinent information will be shared with your doctor and your insurance  company.  Social Worker:  [place], [place] or ([phone]   Information discussed with and copy given to patient by: Lennart Pall, 10/10/2016, 3:57 PM

## 2016-10-10 NOTE — Progress Notes (Signed)
Physical Therapy Session Note  Patient Details  Name: Katelyn Lamb MRN: [id number] Date of Birth: 02/24/1946  Today's Date: 10/10/2016 PT Individual Time: 1105-1130 PT Individual Time Calculation (min): 25 min   Short Term Goals: Week 1:  PT Short Term Goal 1 (Week 1): Pt will consistently perform functional transers iwth min A PT Short Term Goal 2 (Week 1): pt with gait in controlled enivonrment iwht min A 50'  Skilled Therapeutic Interventions/Progress Updates:   Pt received supine in bed and agreeable to PT At bed level due to back pain from previous session.   Supine therex:  SLR 2x10BLE Bridges 2x10  Clam shells with level 2 tband 2x12 Isometric hip abduciton 2x12  Ankle PF 2x15  PT provide min cues for technique to improved LE control and strengthening aspect of movement. Patient noted to have improved technique following instruction from PT.   Pt left supine in bed with call bell in reach and all needs met.           Therapy Documentation Precautions:  Precautions Precautions: Fall Restrictions Weight Bearing Restrictions: No Pain:7/10 in back, aching  See Function Navigator for Current Functional Status.   Therapy/Group: Individual Therapy  Lorie Phenix 10/10/2016, 1:18 PM

## 2016-10-10 NOTE — Progress Notes (Signed)
Physical Therapy Session Note  Patient Details  Name: Katelyn Lamb MRN: [id number] Date of Birth: 17-Dec-1945  Today's Date: 10/10/2016 PT Individual Time: 0900-1000 1300-1330 PT Individual Time Calculation (min): 60 min and 30 min  Short Term Goals: Week 1:  PT Short Term Goal 1 (Week 1): Pt will consistently perform functional transers iwth min A PT Short Term Goal 2 (Week 1): pt with gait in controlled enivonrment iwht min A 50'  Skilled Therapeutic Interventions/Progress Updates: Tx 1: Pt presented in ed agreeable to therapy c/o back pain which pt states she has been premedicated. Pt performed supine to sit with log roll technique with mod/maxA.  No c/o increased pain with bed mobility. Performed sit to/from stand with HHA from bed with posterior lean noted which was corrected with multimodal cues. HHA ambulation to toilet per pt request with cues for increasing BoS and increasing step length. Performed sit to/from stand at toilet with minA and noted decreased eccentric control with descent. Transported via w/c to rehab gym. Performed standing reaching activities on rehab gym initially with hand over hand support. Pt able to grasp cards and place on board initially place with 60% accuracy improving to 100% accuracy. Pt returned to room via w/c and transferred to bed, performed sit to supine with modA with husband present and all current needs met.   Tx2: Pt presented in bed. Performed log roll supine to sit with modA for sequencing and trunk control. Sit to stand with HHA with cues for increased UE usage and avoidance of bracing against bed with BLE. Gait with double and single HHA total 243f with min/modA. Cues for increasing BOS, decreasing anterior wt shifting and improving stride length. Pt returned to room and performed sit to/from stand to toilet. Encouraged use of BUE with hand over hand assist for placement. Pt ambulated to bed and returned to supine as same manner as previous. Pt left in  bed with husband present and all needs met.      Therapy Documentation Precautions:  Precautions Precautions: Fall Restrictions Weight Bearing Restrictions: No   See Function Navigator for Current Functional Status.   Therapy/Group: Individual Therapy  Mohsin Crum  Katelyn Lamb, PTA  10/10/2016, 12:31 PM

## 2016-10-10 NOTE — Plan of Care (Signed)
Problem: SCI BLADDER ELIMINATION Goal: RH STG MANAGE BLADDER WITH ASSISTANCE STG Manage Bladder With Mod Assistance   Outcome: Progressing Maintaining continence but c/o burning.

## 2016-10-10 NOTE — Progress Notes (Signed)
[facility] PHYSICAL MEDICINE & REHABILITATION     PROGRESS NOTE  Subjective/Complaints:  Pt seen sitting up in her chair this AM working with OT.  Per PA, pt with pain yesterday, however, this AM, pt denies complaints. She slept well overnight and notes a good day in therapies yesterday.  She does complain of dysuria.   ROS: +Abd tightness, dysuria. Denies CP, SOB, N/V/D.  Objective: Vital Signs: Blood pressure 118/66, pulse 65, temperature 98.1 F (36.7 C), temperature source Oral, resp. rate 17, weight 64.2 kg (141 lb 8 oz), SpO2 97 %. No results found.  Recent Labs  10/08/16 1845 10/09/16 0927  WBC 8.7 10.2  HGB 11.7* 13.1  HCT 35.2* 38.5  PLT 154 163    Recent Labs  10/08/16 1845 10/09/16 0927  NA  --  138  K  --  3.0*  CL  --  96*  GLUCOSE  --  80  BUN  --  12  CREATININE 0.74 0.80  CALCIUM  --  8.7*   CBG (last 3)  No results for input(s): GLUCAP in the last 72 hours.  Wt Readings from Last 3 Encounters:  10/08/16 64.2 kg (141 lb 8 oz)  10/02/16 63.5 kg (140 lb)  09/20/16 65 kg (143 lb 3.2 oz)    Physical Exam:  BP 118/66 (BP Location: Left Arm)   Pulse 65   Temp 98.1 F (36.7 C) (Oral)   Resp 17   Wt 64.2 kg (141 lb 8 oz)   SpO2 97%   BMI 22.84 kg/m  Constitutional: She appears well-developed. Frail  HENT: Normocephalic and atraumatic.  Eyes: EOM are normal. No discharge.  Cardiovascular: Normal rate, regular rhythm. No JVD.  Respiratory: Effort normal and breath sounds normal. GI: Soft. Bowel sounds are normal.  Musculoskeletal: She exhibits no edema or tenderness. Atrophy b/l hands. Neurological: She is alert and oriented.  Bilateral sensory loss distal to neck  Bilateral ataxia  Motor: B/l UE: 4-/5 proximal to distal (unchanged). B/l LE: 4/5 proximal to distal Skin: Skin is warm and dry.  Psychiatric: She has a normal mood and affect. Slightly anxious    Assessment/Plan: 1. Functional deficits secondary to incomplete paraplegia which  require 3+ hours per day of interdisciplinary therapy in a comprehensive inpatient rehab setting. Physiatrist is providing close team supervision and 24 hour management of active medical problems listed below. Physiatrist and rehab team continue to assess barriers to discharge/monitor patient progress toward functional and medical goals.  Function:  Bathing Bathing position   Position: Bed  Bathing parts Body parts bathed by patient: Right arm, Left arm, Chest, Abdomen, Front perineal area, Right upper leg, Left upper leg Body parts bathed by helper: Buttocks, Right lower leg, Left lower leg, Back  Bathing assist Assist Level:  (mod a)      Upper Body Dressing/Undressing Upper body dressing   What is the patient wearing?: Pull over shirt/dress     Pull over shirt/dress - Perfomed by patient: Thread/unthread right sleeve Pull over shirt/dress - Perfomed by helper: Thread/unthread left sleeve, Put head through opening, Pull shirt over trunk        Upper body assist Assist Level:  (max a)      Lower Body Dressing/Undressing Lower body dressing   What is the patient wearing?: Pants, Ted Hose, Non-skid slipper socks       Pants- Performed by helper: Thread/unthread right pants leg, Thread/unthread left pants leg, Pull pants up/down   Non-skid slipper socks- Performed by helper:  Don/doff right sock, Don/doff left sock               TED Hose - Performed by helper: Don/doff right TED hose, Don/doff left TED hose  Lower body assist Assist for lower body dressing:  (total a)      Toileting Toileting   Toileting steps completed by patient: Performs perineal hygiene Toileting steps completed by helper: Adjust clothing prior to toileting, Adjust clothing after toileting Toileting Assistive Devices: Toilet aid  Toileting assist Assist level:  (max A)   Transfers Chair/bed transfer   Chair/bed transfer method: Stand pivot Chair/bed transfer assist level: Moderate assist (Pt  50 - 74%/lift or lower) Chair/bed transfer assistive device: Other (hand held assist)     Locomotion Ambulation     Max distance: 80 Assist level: Moderate assist (Pt 50 - 74%)   Wheelchair          Cognition Comprehension Comprehension assist level: Understands complex 90% of the time/cues 10% of the time  Expression Expression assist level: Expresses complex 90% of the time/cues < 10% of the time  Social Interaction Social Interaction assist level: Interacts appropriately with others with medication or extra time (anti-anxiety, antidepressant).  Problem Solving Problem solving assist level: Solves complex 90% of the time/cues < 10% of the time  Memory Memory assist level: Recognizes or recalls 75 - 89% of the time/requires cueing 10 - 24% of the time    Medical Problem List and Plan: 1.  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   Complete Zovirax as directed per infectious disease.  Prednisone taper as directed  Cont CIR 2.  DVT Prophylaxis/Anticoagulation: Monitor platelet counts and any signs of bleeding.    Vascular study showing acute LLE DVT, will start Xarelto 3. Pain Management: Neurontin 100 mg 3 times a day, Valium 5 mg every 4 hours as needed muscle spasms, hydrocodone as needed 4. Mood: Xanax 1 mg daily as needed 5. Neuropsych: This patient is capable of making decisions on her own behalf. 6. Skin/Wound Care: Routine skin checks 7. Fluids/Electrolytes/Nutrition: Routine I&Os 8. GERD. Protonix 9. Constipation. Laxative assistance. 9. ABLA: Resolved  Hb 13.1 on 2/6  Cont to monitor 10. Neurogenic bladder  Improving  Monitor with I/O caths PRN  PVRs appear to be negative  Bladder scans ordered 11. Abd muscle spasms  ?Related to potential UTI  Will consider baclofen  12. Dysuria  UA/Ucx ordered  LOS (Days) 2 A FACE TO FACE EVALUATION WAS PERFORMED  Deundre Thong Lorie Phenix 10/10/2016 10:55 AM

## 2016-10-10 NOTE — Progress Notes (Signed)
ANTICOAGULATION CONSULT NOTE - Initial Consult  Pharmacy Consult for xarelto Indication: DVT  Allergies  Allergen Reactions  . Demerol [Meperidine] Other (See Comments)    Hallucinations  . Amoxicillin-Pot Clavulanate Other (See Comments)  . Penicillins Other (See Comments)    Patient Measurements: Weight: 142 lb 6.4 oz (64.6 kg)  Vital Signs: Temp: 98.1 F (36.7 C) (02/07 0444) Temp Source: Oral (02/07 0444) BP: 118/66 (02/07 0444) Pulse Rate: 65 (02/07 0444)  Labs:  Recent Labs  10/08/16 1845 10/09/16 0927  HGB 11.7* 13.1  HCT 35.2* 38.5  PLT 154 163  CREATININE 0.74 0.80    Estimated Creatinine Clearance: 62.1 mL/min (by C-G formula based on SCr of 0.8 mg/dL).   Medical History: Past Medical History:  Diagnosis Date  . Anxiety   . Back pain   . 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 Found to have a new LLE DVT yesterday. Pharmacy is consulted to start [place] for DVT treatment. Pt was on lovenox 40 mg daily for VTE prophylaxis, which was changed to 30 mg BID over night, and patient received a dose this morning at 0600. Hgb 13.1, pltc 163K.   Goal of Therapy:   Monitor platelets by anticoagulation protocol: Yes   Plan:  Xarelto 15mg  po BID for 21 days through 2/27 Switch to xarelto 20 mg daily on 2/28 Monitor CBC/BMET Xarelto education   Maryanna Shape, PharmD, BCPS  Clinical Pharmacist  Pager: [phone]   10/10/2016,1:33 PM

## 2016-10-11 ENCOUNTER — Inpatient Hospital Stay (HOSPITAL_COMMUNITY): Payer: PPO | Admitting: Occupational Therapy

## 2016-10-11 ENCOUNTER — Inpatient Hospital Stay (HOSPITAL_COMMUNITY): Payer: PPO

## 2016-10-11 DIAGNOSIS — N390 Urinary tract infection, site not specified: Secondary | ICD-10-CM

## 2016-10-11 LAB — BASIC METABOLIC PANEL
ANION GAP: 8 (ref 5–15)
BUN: 11 mg/dL (ref 6–20)
CALCIUM: 8.9 mg/dL (ref 8.9–10.3)
CO2: 31 mmol/L (ref 22–32)
Chloride: 96 mmol/L — ABNORMAL LOW (ref 101–111)
Creatinine, Ser: 0.69 mg/dL (ref 0.44–1.00)
GLUCOSE: 77 mg/dL (ref 65–99)
POTASSIUM: 5 mmol/L (ref 3.5–5.1)
SODIUM: 135 mmol/L (ref 135–145)

## 2016-10-11 MED ORDER — NITROFURANTOIN MONOHYD MACRO 100 MG PO CAPS
100.0000 mg | ORAL_CAPSULE | Freq: Two times a day (BID) | ORAL | Status: AC
  Administered 2016-10-11 – 2016-10-17 (×14): 100 mg via ORAL
  Filled 2016-10-11 (×14): qty 1

## 2016-10-11 NOTE — Progress Notes (Signed)
Occupational Therapy Session Note  Patient Details  Name: Katelyn Lamb MRN: [id number] Date of Birth: [birth year]  Today's Date: 10/11/2016 OT Individual Time: [id number] and 1500-1600 OT Individual Time Calculation (min): 74 min and 60 min    Short Term Goals: Week 1:  OT Short Term Goal 1 (Week 1): Pt will perform toileting with mod A in order to decrease level of assistance with self care. OT Short Term Goal 2 (Week 1): Pt will perform LB dressing with mod A in order to increase I with self care. OT Short Term Goal 3 (Week 1): Pt will demonstrate mod A for standing balance during LB clothing management.  OT Short Term Goal 4 (Week 1): Pt will engage in 10 minutes of functional task with 2 rest breaks or less.   Skilled Therapeutic Interventions/Progress Updates:    Session 1: Upon entering the room, pt supine in bed and performed supine >sit with min verbal cues for technique and steady assistance. 1 lb weights placed on B wrist, plate guard added, and utensil placed into built up handle for self feeding this session. Pt needing close super vision for sitting balance. Set up A to open containers and cut food as needed. Pt able to feed self with minimal spillage from spoon this session. Pt picking up weighted cup with both hands and bringing to mouth. After meal, pt performed sit <>stand with mod lifting assistance. She stood at sink to brush teeth with built up handle and set up A with min a for standing balance. Pt returning to bed at end of session. Call bell and all needed items within reach.   Session 2: Upon entering the room, pt supine in bed with husband present. Skilled OT intervention with focus on self care retraining from shower level, functional transfers, and balance. Pt ambulated with hand held assist to bathroom with mod A. Pt performed hygiene after toileting but needing assistance for clothing management. Pt seated on TTB for bathing at shower level. Bath mitt placed on R hand in  order to increase success with self care tasks. Pt has great difficulty holding onto cloth to wash. Pt needing min A for dynamic sitting balance. She was able to bring B LE ankle to opposite knee to wash feet. Pt standing with max A for lifting and lowering in shower for therapist to assist with washing buttocks. Pt transitioned to dressing self while seated on EOB with mod sit <>stand. Pt returned to supine at end of session secondary to fatigue. Husband remained present in room and all needs within reach.   Therapy Documentation Precautions:  Precautions Precautions: Fall Restrictions Weight Bearing Restrictions: No General:   Vital Signs: Therapy Vitals Temp: 98 F (36.7 C) Temp Source: Oral Pulse Rate: 75 Resp: 18 BP: (!) 87/55 (rn notified) Patient Position (if appropriate): Lying Oxygen Therapy SpO2: 95 % O2 Device: Not Delivered Pain: Pain Assessment Pain Assessment: 0-10 Pain Score: Asleep Pain Type: Chronic pain Pain Location: Back Patients Stated Pain Goal: 2 Pain Intervention(s): Medication (See eMAR) ADL:   Exercises:   Other Treatments:    See Function Navigator for Current Functional Status.   Therapy/Group: Individual Therapy  [name] 10/11/2016, 4:56 PM

## 2016-10-11 NOTE — Discharge Instructions (Addendum)
Inpatient Rehab Discharge Instructions  Katelyn Lamb Discharge date and time: No discharge date for patient encounter.   Activities/Precautions/ Functional Status: Activity: activity as tolerated Diet: regular diet Wound Care: none needed Functional status:  ___ No restrictions     ___ Walk up steps independently ___ 24/7 supervision/assistance   ___ Walk up steps with assistance ___ Intermittent supervision/assistance  ___ Bathe/dress independently ___ Walk with walker     _x__ Bathe/dress with assistance ___ Walk Independently    ___ Shower independently ___ Walk with assistance    ___ Shower with assistance ___ No alcohol     ___ Return to work/school ________            COMMUNITY REFERRALS UPON DISCHARGE:    Home Health:   PT     OT                           Agency:  [facility] Phone: [phone]   Medical Equipment/Items Ordered: tub bench                                                     Agency/Supplier:  [facility] [phone]    Special Instructions:    My questions have been answered and I understand these instructions. I will adhere to these goals and the provided educational materials after my discharge from the hospital.  Patient/Caregiver Signature _______________________________ Date __________  Clinician Signature _______________________________________ Date __________  Please bring this form and your medication list with you to all your follow-up doctor's appointments. Information on my medicine - XARELTO (rivaroxaban)  This medication education was reviewed with me or my healthcare representative as part of my discharge preparation.    WHY WAS XARELTO PRESCRIBED FOR YOU? Xarelto was prescribed to treat blood clots that may have been found in the veins of your legs (deep vein thrombosis) or in your lungs (pulmonary embolism) and to reduce the risk of them occurring again.  What do you need to know about Xarelto? The  starting dose is one 15 mg tablet taken TWICE daily with food for the FIRST 21 DAYS then on 10/31/2016  the dose is changed to one 20 mg tablet taken ONCE A DAY with your evening meal.  DO NOT stop taking Xarelto without talking to the health care provider who prescribed the medication.  Refill your prescription for 20 mg tablets before you run out.  After discharge, you should have regular check-up appointments with your healthcare provider that is prescribing your Xarelto.  In the future your dose may need to be changed if your kidney function changes by a significant amount.  What do you do if you miss a dose? If you are taking Xarelto TWICE DAILY and you miss a dose, take it as soon as you remember. You may take two 15 mg tablets (total 30 mg) at the same time then resume your regularly scheduled 15 mg twice daily the next day.  If you are taking Xarelto ONCE DAILY and you miss a dose, take it as soon as you remember on the same day then continue your regularly scheduled once daily regimen the next day. Do not take two doses of Xarelto at the same time.   Important Safety Information Xarelto is a blood thinner medicine that  can cause bleeding. You should call your healthcare provider right away if you experience any of the following: ? Bleeding from an injury or your nose that does not stop. ? Unusual colored urine (red or dark brown) or unusual colored stools (red or black). ? Unusual bruising for unknown reasons. ? A serious fall or if you hit your head (even if there is no bleeding).  Some medicines may interact with Xarelto and might increase your risk of bleeding while on Xarelto. To help avoid this, consult your healthcare provider or pharmacist prior to using any new prescription or non-prescription medications, including herbals, vitamins, non-steroidal anti-inflammatory drugs (NSAIDs) and supplements.  This website has more information on Xarelto: [email or web address]

## 2016-10-11 NOTE — Progress Notes (Signed)
[place] PHYSICAL MEDICINE & REHABILITATION     PROGRESS NOTE  Subjective/Complaints:  Pt seen sitting up in her chair working with OT this AM.  She slept well overnight.  She notes improvement in function with wrist weights. She denies pain.  She notes intermittent dysuria.   ROS: +Dysuria. Denies CP, SOB, N/V/D.  Objective: Vital Signs: Blood pressure 97/60, pulse 62, temperature 97.8 F (36.6 C), temperature source Oral, resp. rate 18, weight 64.6 kg (142 lb 6.4 oz), SpO2 97 %. No results found.  Recent Labs  10/08/16 1845 10/09/16 0927  WBC 8.7 10.2  HGB 11.7* 13.1  HCT 35.2* 38.5  PLT 154 163    Recent Labs  10/09/16 0927 10/11/16 0436  NA 138 135  K 3.0* 5.0  CL 96* 96*  GLUCOSE 80 77  BUN 12 11  CREATININE 0.80 0.69  CALCIUM 8.7* 8.9   CBG (last 3)  No results for input(s): GLUCAP in the last 72 hours.  Wt Readings from Last 3 Encounters:  10/10/16 64.6 kg (142 lb 6.4 oz)  10/02/16 63.5 kg (140 lb)  09/20/16 65 kg (143 lb 3.2 oz)    Physical Exam:  BP 97/60 (BP Location: Left Arm)   Pulse 62   Temp 97.8 F (36.6 C) (Oral)   Resp 18   Wt 64.6 kg (142 lb 6.4 oz)   SpO2 97%   BMI 22.98 kg/m  Constitutional: She appears well-developed. Frail  HENT: Normocephalic and atraumatic.  Eyes: EOM are normal. No discharge.  Cardiovascular: Normal rate, regular rhythm. No JVD.  Respiratory: Effort normal and breath sounds normal. GI: Soft. Bowel sounds are normal.  Musculoskeletal: She exhibits no edema or tenderness. Atrophy b/l hands. Neurological: She is alert and oriented.  Bilateral sensory loss distal to neck  Bilateral ataxia (stable). Motor: B/l UE: 4-/5 proximal to distal (unchanged). B/l LE: 4/5 proximal to distal Skin: Skin is warm and dry.  Psychiatric: She has a normal mood and affect. Slightly anxious    Assessment/Plan: 1. Functional deficits secondary to incomplete paraplegia which require 3+ hours per day of interdisciplinary therapy  in a comprehensive inpatient rehab setting. Physiatrist is providing close team supervision and 24 hour management of active medical problems listed below. Physiatrist and rehab team continue to assess barriers to discharge/monitor patient progress toward functional and medical goals.  Function:  Bathing Bathing position   Position: Bed  Bathing parts Body parts bathed by patient: Right arm, Left arm, Chest, Abdomen, Front perineal area, Right upper leg, Left upper leg Body parts bathed by helper: Buttocks, Right lower leg, Left lower leg, Back  Bathing assist Assist Level:  (mod a)      Upper Body Dressing/Undressing Upper body dressing   What is the patient wearing?: Pull over shirt/dress     Pull over shirt/dress - Perfomed by patient: Thread/unthread right sleeve Pull over shirt/dress - Perfomed by helper: Thread/unthread left sleeve, Put head through opening, Pull shirt over trunk        Upper body assist Assist Level:  (max a)      Lower Body Dressing/Undressing Lower body dressing   What is the patient wearing?: Pants, Ted Hose, Non-skid slipper socks       Pants- Performed by helper: Thread/unthread right pants leg, Thread/unthread left pants leg, Pull pants up/down   Non-skid slipper socks- Performed by helper: Don/doff right sock, Don/doff left sock               TED Hose -  Performed by helper: Don/doff right TED hose, Don/doff left TED hose  Lower body assist Assist for lower body dressing:  (total a)      Toileting Toileting   Toileting steps completed by patient: Performs perineal hygiene Toileting steps completed by helper: Adjust clothing prior to toileting, Adjust clothing after toileting Toileting Assistive Devices: Toilet aid  Toileting assist Assist level:  (max A)   Transfers Chair/bed transfer   Chair/bed transfer method: Stand pivot Chair/bed transfer assist level: Moderate assist (Pt 50 - 74%/lift or lower) Chair/bed transfer assistive  device: Other (hand held assist)     Locomotion Ambulation     Max distance: 10 Assist level: Moderate assist (Pt 50 - 74%)   Wheelchair          Cognition Comprehension Comprehension assist level: Understands complex 90% of the time/cues 10% of the time  Expression Expression assist level: Expresses complex 90% of the time/cues < 10% of the time  Social Interaction Social Interaction assist level: Interacts appropriately with others with medication or extra time (anti-anxiety, antidepressant).  Problem Solving Problem solving assist level: Solves complex 90% of the time/cues < 10% of the time  Memory Memory assist level: Recognizes or recalls 75 - 89% of the time/requires cueing 10 - 24% of the time    Medical Problem List and Plan: 1.  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   Complete Zovirax as directed per infectious disease.  Prednisone taper as directed  Cont CIR  Discussed with therapies regarding use of weight for functional improvement, appears to be helping 2.  DVT Prophylaxis/Anticoagulation: Monitor platelet counts and any signs of bleeding.    Vascular study showing acute LLE DVT, started Xarelto 3. Pain Management: Neurontin 100 mg 3 times a day, Valium 5 mg every 4 hours as needed muscle spasms, hydrocodone as needed 4. Mood: Xanax 1 mg daily as needed 5. Neuropsych: This patient is capable of making decisions on her own behalf. 6. Skin/Wound Care: Routine skin checks 7. Fluids/Electrolytes/Nutrition: Routine I&Os  BMP WNL 2/8 8. GERD. Protonix 9. Constipation. Laxative assistance. 9. ABLA: Resolved  Hb 13.1 on 2/6  Cont to monitor 10. Neurogenic bladder  Improving  Monitor with I/O caths PRN  PVRs appear to be negative  Bladder scans ordered 11. Abd muscle spasms  Confounded by potential UTI  Will consider baclofen  12. Dysuria  UA+, Ucx pending  Empiric macrobid started 2/5-2/14  LOS (Days) 3 A  FACE TO FACE EVALUATION WAS PERFORMED  Ankit Lorie Phenix 10/11/2016 8:57 AM

## 2016-10-11 NOTE — Patient Care Conference (Signed)
Inpatient RehabilitationTeam Conference and Plan of Care Update Date: 10/10/2016   Time: 2:45 PM    Patient Name: Katelyn Lamb      Medical Record Number: [id number]  Date of Birth: July 23, 1946 Sex: Female         Room/Bed: 4W17C/4W17C-01 Payor Info: Payor: [employer] / Plan: [employer] Must / Product Type: *No Product type* /    Admitting Diagnosis: Tetraplegia Mydelitis  Admit Date/Time:  10/08/2016  6:00 PM Admission Comments: No comment available   Primary Diagnosis:  <principal problem not specified> Principal Problem: <principal problem not specified>  Patient Active Problem List   Diagnosis Date Noted  . Acute lower UTI   . Dysuria   . Acute deep vein thrombosis (DVT) of popliteal vein of left lower extremity ([place])   . Incomplete paraplegia ([place])   . Acute blood loss anemia   . Neurogenic bladder   . Myelitis due to herpes simplex ([place])   . Neuropathic pain   . Muscle spasm   . Gastroesophageal reflux disease   . Slow transit constipation   . Thrombocytopenia ([place]) 10/03/2016  . Anxiety 10/03/2016  . Abnormal MRI, spinal cord   . Encephalitis and encephalomyelitis   . Myelitis ([place])   . Numbness   . Intractable back pain 09/20/2016  . Numbness of left lower extremity 09/20/2016  . Hyponatremia 09/20/2016  . Herpes zoster 09/20/2016    Expected Discharge Date: Expected Discharge Date: 10/26/16  Team Members Present: Physician leading conference: Dr. 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 LBM 2/4/1/8. c/o dysuria and pyuria.  Maintain continence. Be free of infection.  Offer frequent toileting. Monitor i/o q shift.   Swallow/Nutrition/ Hydration             ADL's   mod - max A functional transfers, mod A - UB self care, Total A LB self care  Supervision overall  self care retraining, balance, endurance, pt/family education   Mobility   mod/max A transfers, mod A gait without AD  supervision overall  balance, coordination, activity tolerance   Communication             Safety/Cognition/ Behavioral Observations            Pain   chronic backpain with norco 1-2 tab Q4H prn. Minimal LLE pain with DVT.  Maintain tolerable pain goal of 3-10.  Assess pain q shift and prn. Monitor for effectiveness.   Skin   Bruising to arms and abdomen.  Be free of new skin breakdown.  Assess skin integrity q shift and prn.     Rehab Goals Patient on target to meet rehab goals: Yes *See Care Plan and progress notes for long and short-term goals.  Barriers to Discharge: Mobility, transfers, acute DVT, neurogenic bladder, abd spasms, dysuria    Possible Resolutions to Barriers:  UA/Ucx ordered, DVT tx strarted, monitor bladder    Discharge Planning/Teaching Needs:  Plan home with spouse who can provide 24/7 assistance.      Team Discussion:  +DVT found.  UTI being treated;  Pt c/o pain around belly.  Overall supervision goals with PT/OT.  UE very uncoordinated/ ataxic;  Therapy to trial weights.  Currently mod/ max overall.  Ambulation better with HHA as cannot control AE.  Revisions to Treatment Plan:  None   Continued Need for Acute Rehabilitation Level of Care: The patient requires daily medical management by a physician with specialized training in physical medicine and rehabilitation for the following conditions: Daily direction of a multidisciplinary physical rehabilitation program to ensure safe treatment while eliciting the highest outcome that is of practical  value to the patient.: Yes Daily medical management of patient stability for increased activity during participation in an intensive rehabilitation regime.: Yes Daily analysis of laboratory values and/or radiology reports with any subsequent need for medication adjustment of medical intervention for : Neurological problems;Urological problems;Other  Ajwa Kimberley, [place] 10/11/2016, 1:22 PM

## 2016-10-11 NOTE — Progress Notes (Signed)
Physical Therapy Session Note  Patient Details  Name: Katelyn Lamb MRN: [id number] Date of Birth: [birth year]  Today's Date: 10/11/2016 PT Individual Time: [id number] PT Individual Time Calculation (min): 60 min   Short Term Goals: Week 1:  PT Short Term Goal 1 (Week 1): Pt will consistently perform functional transers iwth min A PT Short Term Goal 2 (Week 1): pt with gait in controlled enivonrment iwht min A 50'  Skilled Therapeutic Interventions/Progress Updates:    Session focused on neuro re-ed to address balance, coordination, postural control, motor control, and functional mobility. W/c propulsion with BLE for coordination and reciprocal movement pattern re-training activity. Stand pivot transfers with overall min to mod assist during session. Engaged in blocked practice sit <> stands with focus on eccentric control, forward weightshift, and maintaining balance once upright as pt with tendency for posterior lean. Progressed activity with pt holding foam mat in B hands for closed chain activity to address BUE coordination and increase challenge of balance. Gait training without AD with HHA and then progressed to patient carrying foam mat which then progressed to carrying foam mat with 3 cups stacked in 2 layers to increase challenge of activity and address balance, BUE coordination, and improving gait quality. Stair negotiation with mod assist ascending and max assist descending until last step when pt mistepped and R shoe came off requiring +2 assist for safety to descend into a chair. End of session transferred back to bed and positioned in supine to rest. All needs in reach.  Therapy Documentation Precautions:  Precautions Precautions: Fall Restrictions Weight Bearing Restrictions: No Pain: Pain Assessment Pain Assessment: 0-10 Pain Score: 2  Pain Type: Chronic pain Pain Location: Back Pain Orientation: Lower Pain Intervention(s): Medication (See eMAR)  See Function Navigator for  Current Functional Status.   Therapy/Group: Individual Therapy  [name], PT, DPT  10/11/2016, 12:11 PM

## 2016-10-12 ENCOUNTER — Inpatient Hospital Stay (HOSPITAL_COMMUNITY): Payer: PPO | Admitting: Physical Therapy

## 2016-10-12 ENCOUNTER — Inpatient Hospital Stay (HOSPITAL_COMMUNITY): Payer: PPO

## 2016-10-12 ENCOUNTER — Inpatient Hospital Stay (HOSPITAL_COMMUNITY): Payer: PPO | Admitting: Occupational Therapy

## 2016-10-12 DIAGNOSIS — K5903 Drug induced constipation: Secondary | ICD-10-CM

## 2016-10-12 LAB — URINE CULTURE

## 2016-10-12 MED ORDER — POLYETHYLENE GLYCOL 3350 17 G PO PACK
17.0000 g | PACK | Freq: Every day | ORAL | Status: DC
  Administered 2016-10-12 – 2016-10-19 (×8): 17 g via ORAL
  Filled 2016-10-12 (×15): qty 1

## 2016-10-12 NOTE — Progress Notes (Signed)
Physical Therapy Session Note  Patient Details  Name: Katelyn Lamb MRN: [id number] Date of Birth: 1945/10/09  Today's Date: 10/12/2016 PT Individual Time: 1100-1200 PT Individual Time Calculation (min): 60 min   Short Term Goals: Week 1:  PT Short Term Goal 1 (Week 1): Pt will consistently perform functional transers iwth min A PT Short Term Goal 2 (Week 1): pt with gait in controlled enivonrment iwht min A 50'  Skilled Therapeutic Interventions/Progress Updates:    Pt slow to arouse but agreeable to session. Min to mod assist for bed mobility with cues for technique to get to EOB and extra time. Pt able to walk into bathroom with min to mod assist (to negotiate turn and threshold) to perform toileting needs with attempt at patient to manage clothing but ultimately required assist due to ataxia in BUE. Required mod assist for dynamic standing standing balance during this. Hand hygiene at sink for bimanual task and addressing overall coordination to turn on sink and get paper towels requiring assist for dynamic standing balance and extra time to complete task due to impaired coordination. Pt able to gait to/from therapy session and unit with min to mod assist without AD and cues for attention to task - with distracting environment pt tends to lose balance and required mod assist to recover. Neuro re-ed to address reciprocal movement pattern re-training and close chain activity (BUE in adaptive hand pieces) x 10 min on level 3 on Nustep and blocked practice sit <> stands with hands on knees for closed chain activity x 10 reps with min to mod assist. Cues for upward gaze and facilitation of anterior weightshift and to assist with eccentric control. End of session transferred to the bed for bladder scan to occur by RN. Husband present during therapy session to observe and encourage.   Therapy Documentation Precautions:  Precautions Precautions: Fall Restrictions Weight Bearing Restrictions: No    Pain:  c/o some low back pain - unrated and premedicated per report   See Function Navigator for Current Functional Status.   Therapy/Group: Individual Therapy  [name], PT, DPT  10/12/2016, 12:14 PM

## 2016-10-12 NOTE — Progress Notes (Signed)
[place] PHYSICAL MEDICINE & REHABILITATION     PROGRESS NOTE  Subjective/Complaints:  Pt seen sitting up in bed this AM eating breakfast with nursing assistance.  Encouraged use of wrist weights for ataxia.  She notes overall improvement in ataxia.  She also notes she has not had a BM in several days.   ROS: +Constipation. Denies CP, SOB, N/V/D.  Objective: Vital Signs: Blood pressure 100/66, pulse 73, temperature 98.4 F (36.9 C), temperature source Oral, resp. rate 18, weight 64.6 kg (142 lb 6.4 oz), SpO2 98 %. No results found.  Recent Labs  10/09/16 0927  WBC 10.2  HGB 13.1  HCT 38.5  PLT 163    Recent Labs  10/09/16 0927 10/11/16 0436  NA 138 135  K 3.0* 5.0  CL 96* 96*  GLUCOSE 80 77  BUN 12 11  CREATININE 0.80 0.69  CALCIUM 8.7* 8.9   CBG (last 3)  No results for input(s): GLUCAP in the last 72 hours.  Wt Readings from Last 3 Encounters:  10/10/16 64.6 kg (142 lb 6.4 oz)  10/02/16 63.5 kg (140 lb)  09/20/16 65 kg (143 lb 3.2 oz)    Physical Exam:  BP 100/66 (BP Location: Left Arm)   Pulse 73   Temp 98.4 F (36.9 C) (Oral)   Resp 18   Wt 64.6 kg (142 lb 6.4 oz)   SpO2 98%   BMI 22.98 kg/m  Constitutional: She appears well-developed. Frail  HENT: Normocephalic and atraumatic.  Eyes: EOMI. No discharge.  Cardiovascular: Normal rate, regular rhythm. No JVD.  Respiratory: Effort normal and breath sounds normal. GI: Soft. Bowel sounds are normal.  Musculoskeletal: She exhibits no edema or tenderness. Atrophy b/l hands. Neurological: She is alert and oriented.  Bilateral sensory loss distal to neck  Bilateral ataxia  Motor: B/l UE: 4-/5 proximal to distal (stable). B/l LE: 4/5 proximal to distal Skin: Skin is warm and dry.  Psychiatric: She has a normal mood and affect. Slightly anxious    Assessment/Plan: 1. Functional deficits secondary to incomplete paraplegia which require 3+ hours per day of interdisciplinary therapy in a comprehensive  inpatient rehab setting. Physiatrist is providing close team supervision and 24 hour management of active medical problems listed below. Physiatrist and rehab team continue to assess barriers to discharge/monitor patient progress toward functional and medical goals.  Function:  Bathing Bathing position   Position: Shower  Bathing parts Body parts bathed by patient: Right arm, Left arm, Chest, Abdomen, Front perineal area, Right upper leg, Left upper leg, Right lower leg, Left lower leg Body parts bathed by helper: Back, Buttocks  Bathing assist Assist Level: Assistive device, Touching or steadying assistance(Pt > 75%) Assistive Device Comment: bath mitt    Upper Body Dressing/Undressing Upper body dressing   What is the patient wearing?: Pull over shirt/dress     Pull over shirt/dress - Perfomed by patient: Thread/unthread right sleeve Pull over shirt/dress - Perfomed by helper: Thread/unthread left sleeve, Put head through opening, Pull shirt over trunk        Upper body assist Assist Level:  (max A)      Lower Body Dressing/Undressing Lower body dressing   What is the patient wearing?: Pants, Underwear, Non-skid slipper socks Underwear - Performed by patient: Thread/unthread right underwear leg, Thread/unthread left underwear leg Underwear - Performed by helper: Pull underwear up/down Pants- Performed by patient: Thread/unthread right pants leg Pants- Performed by helper: Thread/unthread left pants leg, Pull pants up/down   Non-skid slipper socks- Performed  by helper: Don/doff right sock, Don/doff left sock               TED Hose - Performed by helper: Don/doff right TED hose, Don/doff left TED hose  Lower body assist Assist for lower body dressing: Touching or steadying assistance (Pt > 75%) (mod A)      Toileting Toileting   Toileting steps completed by patient: Performs perineal hygiene Toileting steps completed by helper: Adjust clothing prior to toileting,  Adjust clothing after toileting Toileting Assistive Devices: Toilet aid  Toileting assist Assist level:  (max A)   Transfers Chair/bed transfer   Chair/bed transfer method: Stand pivot Chair/bed transfer assist level: Moderate assist (Pt 50 - 74%/lift or lower) Chair/bed transfer assistive device: Other (hand held assist)     Locomotion Ambulation     Max distance: 95' Assist level: Touching or steadying assistance (Pt > 75%)   Wheelchair   Type: Manual Max wheelchair distance: 150' Assist Level: Supervision or verbal cues (BLE)  Cognition Comprehension Comprehension assist level: Understands complex 90% of the time/cues 10% of the time  Expression Expression assist level: Expresses complex 90% of the time/cues < 10% of the time  Social Interaction Social Interaction assist level: Interacts appropriately with others with medication or extra time (anti-anxiety, antidepressant).  Problem Solving Problem solving assist level: Solves complex 90% of the time/cues < 10% of the time  Memory Memory assist level: Recognizes or recalls 75 - 89% of the time/requires cueing 10 - 24% of the time    Medical Problem List and Plan: 1.  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   Complete Zovirax as directed per infectious disease.  Prednisone taper as directed  Cont CIR 2.  DVT Prophylaxis/Anticoagulation: Monitor platelet counts and any signs of bleeding.    Vascular study showing acute LLE DVT, started Xarelto 3. Pain Management: Neurontin 100 mg 3 times a day, Valium 5 mg every 4 hours as needed muscle spasms, hydrocodone as needed 4. Mood: Xanax 1 mg daily as needed 5. Neuropsych: This patient is capable of making decisions on her own behalf. 6. Skin/Wound Care: Routine skin checks 7. Fluids/Electrolytes/Nutrition: Routine I&Os  BMP WNL on 2/8 8. GERD. Protonix 9. Constipation. Laxative assistance.  Increased bowel reg on 2/9 9. ABLA:  Resolved  Hb 13.1 on 2/6  Cont to monitor 10. Neurogenic bladder  Monitor with I/O caths PRN  PVRs now show retention  Bladder scans   Will consider meds if consistent 11. Abd muscle spasms  Confounded by UTI  Will consider baclofen  12. Dysuria  UA+, Ucx Klebsiella Oxytoca  Macrobid 2/8-2/14  LOS (Days) 4 A FACE TO FACE EVALUATION WAS PERFORMED  Ankit Lorie Phenix 10/12/2016 9:01 AM

## 2016-10-12 NOTE — Progress Notes (Signed)
Occupational Therapy Session Note  Patient Details  Name: Katelyn Lamb MRN: [id number] Date of Birth: Apr 09, 1946  Today's Date: 10/12/2016 OT Individual Time: [id number] OT Individual Time Calculation (min): 59 min    Short Term Goals: Week 1:  OT Short Term Goal 1 (Week 1): Pt will perform toileting with mod A in order to decrease level of assistance with self care. OT Short Term Goal 2 (Week 1): Pt will perform LB dressing with mod A in order to increase I with self care. OT Short Term Goal 3 (Week 1): Pt will demonstrate mod A for standing balance during LB clothing management.  OT Short Term Goal 4 (Week 1): Pt will engage in 10 minutes of functional task with 2 rest breaks or less.   Skilled Therapeutic Interventions/Progress Updates: Pt was sitting at EOB with RN at time of arrival eating breakfast with built up utensils and R weighted cuff, agreeable to tx. Tx focus on bilateral UE coordination and functional transfers. Pt required assist for stabilizing eating utensil prior to bringing food to mouth due to dropping utensil in lap x3 times with Mod spillage. Cues for pacing and concentration provided with noticeable improvement in quality of UE movement. She declined ADLs, but reported wanting to complete oral care at sink. She ambulated short distance to chair at sink with Mod A HHA. Built up toothbrush utilized to increase independence with task. Weighted cup used for rinse afterwards with bilateral UEs and extra time. Due to UE deficits, pt frequently dropped items in sink and required Mod A and encouragement to continue. Teds donned with Total A with pt attempting to don socks in figure 4 position. OT assistance required due to coordination demands. At end of session pt was left in w/c with all needs within reach.      Therapy Documentation Precautions:  Precautions Precautions: Fall Restrictions Weight Bearing Restrictions: No General:   Vital Signs:   Pain: Pt left with RN for  pain medication at end of session, though no c/o during tx   ADL:      See Function Navigator for Current Functional Status.   Therapy/Group: Individual Therapy  Kelly Eisler A Tyshia Fenter 10/12/2016, 12:10 PM

## 2016-10-12 NOTE — Progress Notes (Signed)
Social Work Patient ID: Katelyn Lamb, female   DOB: 01-12-1946, 71 y.o.   MRN: [id number]   Met with pt and spouse yesterday afternoon to review team conference.  Both aware and agreeable with targeted d/c date of 2/23 with supervision goals overall. Both pleased with progress over the first few days on CIR.  They are asking if a repeat MRI will be done once anti-viral meds are halted.  I will alert MD/PA to this question.  Will continue to follow for support and d/c planning needs.  Zackeriah Kissler, LCSW

## 2016-10-12 NOTE — Progress Notes (Addendum)
Physical Therapy Session Note  Patient Details  Name: Katelyn Lamb MRN: [id number] Date of Birth: July 01, 1946  Today's Date: 10/12/2016 PT Individual Time: [id number] PT Individual Time Calculation (min): 30 min   Short Term Goals: Week 1:  PT Short Term Goal 1 (Week 1): Pt will consistently perform functional transers iwth min A PT Short Term Goal 2 (Week 1): pt with gait in controlled enivonrment iwht min A 50'  Skilled Therapeutic Interventions/Progress Updates:    Session 1: Pt up in w/c upon arrival, reporting that she is already tired today. Ambulating to gym with HHA X1, using UEs to navigate through closed doors. Shorter ambulation performed in gym holding pillow for UE incorporation. Seated trunk stabilization activities/UE coordination with reaching and holding ball at variable angles. Standing head rotations Lt/Rt balance challenge. Ambulating back to room without HHA, min/mod assist (variable with fatigue) for balance stability and cues for posture.    Session 2: Pt in bed upon arrival. Ambulating to gym with HHA X1, requiring assist with door due to decreased stability. In gym working on standing balance activities with staggered stance, toe taps on 1 inch step, and head rotations. Pt with poor stability, requiring mod assist for balance. Pt able to hold static standing with min guard for brief periods. Ambulating back to room with min/mod assist for balance. Decreased foot clearance and narrowing BOS with fatigue.   Therapy Documentation Precautions:  Precautions Precautions: Fall Restrictions Weight Bearing Restrictions: No  Pain: 6/10 all over Monitored during session     See Function Navigator for Current Functional Status.   Therapy/Group: Individual Therapy  Cassell Clement, PT, CSCS Pager [phone] Office [phone]  10/12/2016, 3:56 PM

## 2016-10-13 ENCOUNTER — Inpatient Hospital Stay (HOSPITAL_COMMUNITY): Payer: PPO | Admitting: Physical Therapy

## 2016-10-13 NOTE — Progress Notes (Signed)
[place] PHYSICAL MEDICINE & REHABILITATION     PROGRESS NOTE  Subjective/Complaints:  Pt seen laying in bed this AM.  She states she just woke up and slept well overnight.  She is happy that she had a good BM yesterday.  She also notes improvement in abdominal tightness.   ROS: Denies CP, SOB, N/V/D.  Objective: Vital Signs: Blood pressure (!) 89/55, pulse 93, temperature 98.6 F (37 C), temperature source Oral, resp. rate 18, weight 64.6 kg (142 lb 6.4 oz), SpO2 95 %. No results found. No results for input(s): WBC, HGB, HCT, PLT in the last 72 hours.  Recent Labs  10/11/16 0436  NA 135  K 5.0  CL 96*  GLUCOSE 77  BUN 11  CREATININE 0.69  CALCIUM 8.9   CBG (last 3)  No results for input(s): GLUCAP in the last 72 hours.  Wt Readings from Last 3 Encounters:  10/10/16 64.6 kg (142 lb 6.4 oz)  10/02/16 63.5 kg (140 lb)  09/20/16 65 kg (143 lb 3.2 oz)    Physical Exam:  BP (!) 89/55 (BP Location: Left Arm)   Pulse 93   Temp 98.6 F (37 C) (Oral)   Resp 18   Wt 64.6 kg (142 lb 6.4 oz)   SpO2 95%   BMI 22.98 kg/m  Constitutional: She appears well-developed. Frail  HENT: Normocephalic and atraumatic.  Eyes: EOMI. No discharge.  Cardiovascular: RRR. No JVD.  Respiratory: Effort normal and breath sounds normal. GI: Soft. Bowel sounds are normal.  Musculoskeletal: She exhibits no edema or tenderness. Atrophy b/l hands. Neurological: She is alert and oriented.  Bilateral sensory loss distal to neck  Bilateral ataxia  Motor: B/l UE: 4-/5 proximal to distal (unchanged). B/l LE: 4/5 proximal to distal Skin: Skin is warm and dry.  Psychiatric: She has a normal mood and affect. Slightly anxious    Assessment/Plan: 1. Functional deficits secondary to incomplete paraplegia which require 3+ hours per day of interdisciplinary therapy in a comprehensive inpatient rehab setting. Physiatrist is providing close team supervision and 24 hour management of active medical  problems listed below. Physiatrist and rehab team continue to assess barriers to discharge/monitor patient progress toward functional and medical goals.  Function:  Bathing Bathing position   Position: Shower  Bathing parts Body parts bathed by patient: Right arm, Left arm, Chest, Abdomen, Front perineal area, Right upper leg, Left upper leg, Right lower leg, Left lower leg Body parts bathed by helper: Back, Buttocks  Bathing assist Assist Level: Assistive device, Touching or steadying assistance(Pt > 75%) Assistive Device Comment: bath mitt    Upper Body Dressing/Undressing Upper body dressing   What is the patient wearing?: Pull over shirt/dress     Pull over shirt/dress - Perfomed by patient: Thread/unthread right sleeve Pull over shirt/dress - Perfomed by helper: Thread/unthread left sleeve, Put head through opening, Pull shirt over trunk        Upper body assist Assist Level:  (max A)      Lower Body Dressing/Undressing Lower body dressing   What is the patient wearing?: Pants, Underwear, Non-skid slipper socks Underwear - Performed by patient: Thread/unthread right underwear leg, Thread/unthread left underwear leg Underwear - Performed by helper: Pull underwear up/down Pants- Performed by patient: Thread/unthread right pants leg Pants- Performed by helper: Thread/unthread left pants leg, Pull pants up/down   Non-skid slipper socks- Performed by helper: Don/doff right sock, Don/doff left sock  TED Hose - Performed by helper: Don/doff right TED hose, Don/doff left TED hose  Lower body assist Assist for lower body dressing: Touching or steadying assistance (Pt > 75%) (mod A)      Toileting Toileting   Toileting steps completed by patient: Performs perineal hygiene Toileting steps completed by helper: Adjust clothing prior to toileting, Adjust clothing after toileting Toileting Assistive Devices: Grab bar or rail  Toileting assist Assist level:  Touching or steadying assistance (Pt.75%)   Transfers Chair/bed transfer   Chair/bed transfer method: Ambulatory Chair/bed transfer assist level: Moderate assist (Pt 50 - 74%/lift or lower) Chair/bed transfer assistive device:  (handheld assist)     Locomotion Ambulation     Max distance: 160 ft Assist level: Touching or steadying assistance (Pt > 75%)   Wheelchair   Type: Manual Max wheelchair distance: 150' Assist Level: Supervision or verbal cues (BLE)  Cognition Comprehension Comprehension assist level: Understands complex 90% of the time/cues 10% of the time  Expression Expression assist level: Expresses complex 90% of the time/cues < 10% of the time  Social Interaction Social Interaction assist level: Interacts appropriately with others with medication or extra time (anti-anxiety, antidepressant).  Problem Solving Problem solving assist level: Solves complex 90% of the time/cues < 10% of the time  Memory Memory assist level: Recognizes or recalls 75 - 89% of the time/requires cueing 10 - 24% of the time    Medical Problem List and Plan: 1.  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   Complete Zovirax as directed per infectious disease.  Prednisone taper as directed  Cont CIR 2.  DVT Prophylaxis/Anticoagulation: Monitor platelet counts and any signs of bleeding.    Vascular study showing acute LLE DVT, started Xarelto 3. Pain Management: Neurontin 100 mg 3 times a day, Valium 5 mg every 4 hours as needed muscle spasms, hydrocodone as needed 4. Mood: Xanax 1 mg daily as needed 5. Neuropsych: This patient is capable of making decisions on her own behalf. 6. Skin/Wound Care: Routine skin checks 7. Fluids/Electrolytes/Nutrition: Routine I&Os  BMP WNL on 2/8 8. GERD. Protonix 9. Constipation. Laxative assistance.  Increased bowel reg on 2/9  Improving 9. ABLA: Resolved  Hb 13.1 on 2/6  Cont to monitor 10. Neurogenic  bladder  Monitor with I/O caths PRN  PVRs fluctuating  Bladder scans   Will consider meds if consistent 11. Abd muscle spasms  Confounded by UTI  Will consider baclofen if necessary  Improving 12. Dysuria  UA+, Ucx Klebsiella Oxytoca  Macrobid 2/8-2/14  LOS (Days) 5 A FACE TO FACE EVALUATION WAS PERFORMED  Kullen Tomasetti Lorie Phenix 10/13/2016 10:47 AM

## 2016-10-13 NOTE — Progress Notes (Signed)
Complaining of "burning" pain to back at 2000. PRN meds not due, scheduled neurontin given. Also complained of constipation at that time. Up to BR, without results. At 2300, Up to [facility]-disimpacted for large amount of hard stool. At [facility], no void 8 hours, up to [facility] voided, PVR=0. Katelyn Lamb

## 2016-10-13 NOTE — Progress Notes (Signed)
Physical Therapy Session Note  Patient Details  Name: Katelyn Lamb MRN: [id number] Date of Birth: [birth year]  Today's Date: 10/13/2016 PT Individual Time: 8403-7543 PT Individual Time Calculation (min): 25 min   Short Term Goals: Week 1:  PT Short Term Goal 1 (Week 1): Pt will consistently perform functional transers iwth min A PT Short Term Goal 2 (Week 1): pt with gait in controlled enivonrment iwht min A 50'  Skilled Therapeutic Interventions/Progress Updates:   Pt received supine in bed and agreeable to PT. Supine>sit transfer with mod assist and mod cues for trunk control to prevent Posterior LOB. Mod assist also provided to come to EOB using reciprcal scooting technique.   Stand pivot transfer to [facility] with mod assist from PT due to constant posterior LOB. .   Sit<>stand completed x 6 throughout treatment with mod assist from PT with constant posterior LOB. Patient unable to demonstrate anterior weight shift to maintian standing balance until gait initiated.   Gait training x 156f x 2 with min assist progressing to mod assist in turns cues from PT for improved step legnth and increased BOS to improve stability.  Dynamic gait instructed by PT to Weave through 8 cones x 2, as well as to carry tennis  Ball on racquet  x 343f Mod assist throughout with additional support to manage tennis racquet.   Patient returned to room and left supine in bed with call bell in reach and all needs met.        Therapy Documentation Precautions:  Precautions Precautions: Fall Restrictions Weight Bearing Restrictions: No Pain:0/10 but reports increased numbness in BLE   See Function Navigator for Current Functional Status.   Therapy/Group: Individual Therapy  AuLorie Phenix/06/2017, 2:59 PM

## 2016-10-14 ENCOUNTER — Inpatient Hospital Stay (HOSPITAL_COMMUNITY): Payer: PPO

## 2016-10-14 ENCOUNTER — Encounter (HOSPITAL_COMMUNITY): Payer: Self-pay | Admitting: Student

## 2016-10-14 ENCOUNTER — Inpatient Hospital Stay (HOSPITAL_COMMUNITY): Payer: PPO | Admitting: Occupational Therapy

## 2016-10-14 MED ORDER — BACLOFEN 5 MG HALF TABLET
5.0000 mg | ORAL_TABLET | Freq: Three times a day (TID) | ORAL | Status: DC
  Administered 2016-10-14 – 2016-10-15 (×3): 5 mg via ORAL
  Filled 2016-10-14 (×3): qty 1

## 2016-10-14 NOTE — Progress Notes (Signed)
Physical Therapy Session Note  Patient Details  Name: Katelyn Lamb MRN: [id number] Date of Birth: [birth year]  Today's Date: 10/14/2016 PT Individual Time: [id number] PT Individual Time Calculation (min): 45 min   Short Term Goals: Week 1:  PT Short Term Goal 1 (Week 1): Pt will consistently perform functional transers iwth min A PT Short Term Goal 2 (Week 1): pt with gait in controlled enivonrment iwht min A 50'  Skilled Therapeutic Interventions/Progress Updates:   mod assist for sit -> stand transfer from EOB with cues and facilitation for anterior weightshift and upright posture. Pt able to ambulate with min assist into bathroom and completed toilet transfers and dynamic standing balance during clothing management with min to mod assist. Neuro re-ed during gait training in hallway with focus on gait quality, coordination and postural control and dynamic balance activity with UE coordination task to reach for and grasp/pitch horseshoes in standing position with min to mod assist. Engaged in functional laundry task to load washer and select appropriate settings for fine motor control of BUE with min assist for standing balance.   Therapy Documentation Precautions:  Precautions Precautions: Fall Restrictions Weight Bearing Restrictions: No   Pain:  Denies pain.    See Function Navigator for Current Functional Status.   Therapy/Group: Individual Therapy  [name], PT, DPT  10/14/2016, 3:14 PM

## 2016-10-14 NOTE — Progress Notes (Signed)
[facility]ILITATION     PROGRESS NOTE  Subjective/Complaints:  Pt seen sitting up in bed this AM.  She slept well overnight.  She has questions regarding her recovery.  She states she still has abdominal tightness.   ROS: +Abdnominal tightness. Denies CP, SOB, N/V/D.  Objective: Vital Signs: Blood pressure 101/68, pulse 81, temperature 97.6 F (36.4 C), temperature source Oral, resp. rate 18, weight 64.6 kg (142 lb 6.4 oz), SpO2 96 %. No results found. No results for input(s): WBC, HGB, HCT, PLT in the last 72 hours. No results for input(s): NA, K, CL, GLUCOSE, BUN, CREATININE, CALCIUM in the last 72 hours.  Invalid input(s): CO CBG (last 3)  No results for input(s): GLUCAP in the last 72 hours.  Wt Readings from Last 3 Encounters:  10/10/16 64.6 kg (142 lb 6.4 oz)  10/02/16 63.5 kg (140 lb)  09/20/16 65 kg (143 lb 3.2 oz)    Physical Exam:  BP 101/68 (BP Location: Left Arm)   Pulse 81   Temp 97.6 F (36.4 C) (Oral)   Resp 18   Wt 64.6 kg (142 lb 6.4 oz)   SpO2 96%   BMI 22.98 kg/m  Constitutional: She appears well-developed. Frail  HENT: Normocephalic and atraumatic.  Eyes: EOMI. No discharge.  Cardiovascular: RRR. No JVD.  Respiratory: Effort normal and breath sounds normal. GI: Soft. Bowel sounds are normal.  Musculoskeletal: She exhibits no edema or tenderness. Atrophy b/l hands. Neurological: She is alert and oriented.  Bilateral sensory loss distal to neck  Bilateral ataxia  Motor: B/l UE: 4-/5 proximal to distal (stable). B/l LE: 4/5 proximal to distal Skin: Skin is warm and dry.  Psychiatric: She has a normal mood and affect. Slightly anxious    Assessment/Plan: 1. Functional deficits secondary to incomplete paraplegia which require 3+ hours per day of interdisciplinary therapy in a comprehensive inpatient rehab setting. Physiatrist is providing close team supervision and 24 hour management of active medical problems listed  below. Physiatrist and rehab team continue to assess barriers to discharge/monitor patient progress toward functional and medical goals.  Function:  Bathing Bathing position   Position: Shower  Bathing parts Body parts bathed by patient: Right arm, Left arm, Chest, Abdomen, Front perineal area, Right upper leg, Left upper leg, Right lower leg, Left lower leg Body parts bathed by helper: Back, Buttocks  Bathing assist Assist Level: Assistive device, Touching or steadying assistance(Pt > 75%) Assistive Device Comment: bath mitt    Upper Body Dressing/Undressing Upper body dressing   What is the patient wearing?: Pull over shirt/dress     Pull over shirt/dress - Perfomed by patient: Thread/unthread right sleeve Pull over shirt/dress - Perfomed by helper: Thread/unthread left sleeve, Put head through opening, Pull shirt over trunk        Upper body assist Assist Level:  (max A)      Lower Body Dressing/Undressing Lower body dressing   What is the patient wearing?: Pants, Underwear, Non-skid slipper socks Underwear - Performed by patient: Thread/unthread right underwear leg, Thread/unthread left underwear leg Underwear - Performed by helper: Pull underwear up/down Pants- Performed by patient: Thread/unthread right pants leg Pants- Performed by helper: Thread/unthread left pants leg, Pull pants up/down   Non-skid slipper socks- Performed by helper: Don/doff right sock, Don/doff left sock               TED Hose - Performed by helper: Don/doff right TED hose, Don/doff left TED hose  Lower body assist  Assist for lower body dressing: Touching or steadying assistance (Pt > 75%) (mod A)      Toileting Toileting   Toileting steps completed by patient: Performs perineal hygiene Toileting steps completed by helper: Adjust clothing prior to toileting, Adjust clothing after toileting Toileting Assistive Devices: Grab bar or rail  Toileting assist Assist level: Touching or steadying  assistance (Pt.75%)   Transfers Chair/bed transfer   Chair/bed transfer method: Ambulatory Chair/bed transfer assist level: Moderate assist (Pt 50 - 74%/lift or lower) Chair/bed transfer assistive device: Armrests     Locomotion Ambulation     Max distance: 125ft Assist level: Moderate assist (Pt 50 - 74%)   Wheelchair   Type: Manual Max wheelchair distance: 150' Assist Level: Supervision or verbal cues (BLE)  Cognition Comprehension Comprehension assist level: Understands basic 90% of the time/cues < 10% of the time  Expression Expression assist level: Expresses basic 90% of the time/requires cueing < 10% of the time.  Social Interaction Social Interaction assist level: Interacts appropriately 90% of the time - Needs monitoring or encouragement for participation or interaction.  Problem Solving Problem solving assist level: Solves basic 90% of the time/requires cueing < 10% of the time  Memory Memory assist level: Recognizes or recalls 75 - 89% of the time/requires cueing 10 - 24% of the time    Medical Problem List and Plan: 1.  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   Complete Zovirax as directed per infectious disease.  Prednisone taper as directed  Cont CIR 2.  DVT Prophylaxis/Anticoagulation: Monitor platelet counts and any signs of bleeding.    Vascular study showing acute LLE DVT, started Xarelto 3. Pain Management: Neurontin 100 mg 3 times a day, Valium 5 mg every 4 hours as needed muscle spasms, hydrocodone as needed 4. Mood: Xanax 1 mg daily as needed 5. Neuropsych: This patient is capable of making decisions on her own behalf. 6. Skin/Wound Care: Routine skin checks 7. Fluids/Electrolytes/Nutrition: Routine I&Os  BMP WNL on 2/8  Labs ordered for tomorrow 8. GERD. Protonix 9. Constipation. Laxative assistance.  Increased bowel reg on 2/9  Improving 9. ABLA: Resolved  Hb 13.1 on 2/6  Labs ordered for  tomorrow  Cont to monitor 10. Neurogenic bladder  Monitor with I/O caths PRN  PVRs fluctuating  Bladder scans   Will consider meds if consistent 11. Abd muscle spasms  Confounded by UTI  Baclofen 5 TID trail 2/11  Improving 12. Acute lower UTI  UA+, Ucx Klebsiella Oxytoca  Macrobid 2/8-2/14  LOS (Days) 6 A FACE TO FACE EVALUATION WAS PERFORMED  Laurna Shetley Lorie Phenix 10/14/2016 10:06 AM

## 2016-10-14 NOTE — Plan of Care (Signed)
Problem: SCI BLADDER ELIMINATION Goal: RH STG MANAGE BLADDER WITH ASSISTANCE STG Manage Bladder With Mod Assistance   Outcome: Not Progressing Continues to require intermittent caths

## 2016-10-14 NOTE — Plan of Care (Signed)
Problem: SCI BOWEL ELIMINATION Goal: RH STG MANAGE BOWEL WITH ASSISTANCE STG Manage Bowel with Mod Assistance.   Outcome: Not Progressing Required disimpaction

## 2016-10-14 NOTE — Progress Notes (Signed)
Occupational Therapy Session Note  Patient Details  Name: Katelyn Lamb MRN: [id number] Date of Birth: 1946/06/04  Today's Date: 10/14/2016 OT Individual Time: [id number] and [id number] OT Individual Time Calculation (min): 65 min and 48 min   Short Term Goals: Week 1:  OT Short Term Goal 1 (Week 1): Pt will perform toileting with mod Katelyn in order to decrease level of assistance with self care. OT Short Term Goal 2 (Week 1): Pt will perform LB dressing with mod Katelyn in order to increase I with self care. OT Short Term Goal 3 (Week 1): Pt will demonstrate mod Katelyn for standing balance during LB clothing management.  OT Short Term Goal 4 (Week 1): Pt will engage in 10 minutes of functional task with 2 rest breaks or less.   Skilled Therapeutic Interventions/Progress Updates: Pt was lying in bed at time of arrival, agreeable to session. Tx focus on ADL retraining and bilateral UE coordination remediation. Pt completed self feeding of breakfast at EOB with close supervision due to posterior LOBs and 1# weighted wrist cuffs. Dycem applied to weighted cup to increase ease of bringing cup to mouth with no spillage. Pt dropped built up eating utensil x3 instances and required Min Katelyn for stabilization when retrieving utensil from plate. Plate guard also used for maximizing independence. Pt verbalized frustration/discouragement with her UE coordination. Pt provided encouragement and therapeutic listening to increase self efficacy and motivation in tx. Afterwards she was agreeable to complete oral care w/c level at sink. She ambulated short distance Mod Katelyn HHA to w/c. Oral care completed with weighted cuffs and built up toothbrush with Min Katelyn for uncapping items. With extra time, pt able to cap toothpaste and adjust water temperature. At end of session pt was left in w/c and all needs within reach. Max encouragement provided for her to stay up instead of returning to bed.     2nd Session 1:1 tx (48 min) Pt participated in  skilled OT session focusing on B UE coordination remediation, functional transfers, standing balance, and IADL retraining. Pt was in w/c with family present at time of arrival, agreeable to tx. She was taken to dayroom and completed laundry folding task with 1# weighted wrist cuffs and extra time. Pt able to fold towels, small wash cloths, and pillow cases without physical assistance. Music utilized to increase motivation with pt reporting helpfulness in increasing affect. Afterwards pts spouse reported that her laundry was complete and needed to be transferred from washer to dryer. Pt ambulated short distance into laundry room with Mod Katelyn HHA. With weighted cuffs still donned, she retrieved items from washer and transferred them to dryer, required Min Katelyn for task. Afterwards she ambulated back to w/c and was returned to room. Pts spouse and son verbalized concerns regarding pts DVT status, requesting to consult physician. Electronic note left for MD. Pt then requested to go back to bed, ambulated to bed in manner as written above, was repositioned for comfort, and left with family at time of departure.   Therapy Documentation Precautions:  Precautions Precautions: Fall Restrictions Weight Bearing Restrictions: No   Pain: No c/o pain during session    ADL:      See Function Navigator for Current Functional Status.   Therapy/Group: Individual Therapy  Katelyn Lamb Katelyn Lamb 10/14/2016, 1:50 PM

## 2016-10-14 NOTE — Plan of Care (Signed)
Problem: SCI BOWEL ELIMINATION Goal: RH STG SCI MANAGE BOWEL WITH MEDICATION WITH ASSISTANCE STG SCI Manage bowel with medication with Mod assistance.   Outcome: Not Progressing Required disimpaction

## 2016-10-14 NOTE — Progress Notes (Addendum)
Physical Therapy Session Note  Patient Details  Name: Katelyn Lamb MRN: [id number] Date of Birth: 1946/07/18  Today's Date: 10/14/2016 PT Individual Time: 1000-1100 PT Individual Time Calculation (min): 60 min   Short Term Goals: Week 1:  PT Short Term Goal 1 (Week 1): Pt will consistently perform functional transers iwth min A PT Short Term Goal 2 (Week 1): pt with gait in controlled enivonrment iwht min A 50'  Skilled Therapeutic Interventions/Progress Updates:    Session focused on bed mobility re-training in bed and on mat including rolling, supine <> sit, and getting into prone and prone on elbows and neuro re-ed to address balance, coordination (BUE and BLE), postural control, and transfers. Pt requires up to mod assist for functional transfers and gait due to impaired postural control and posterior lean. In prone and prone on elbows addressed closed chain UE activity and activation of thoracic and cervical spine to maintain this position about 5 seconds before rest break needed. Pt unable to achieve quadruped (at least not with one person assist) due to back pain when attempting. Neuro re-ed for bridging with yoga block between knees x 10 reps with 5 second hold for coordination and motor control. Seated EOM addressed fine motor and coordination activity to pick up and place cups at a target using BUE and 1 UE as able. End of session pt requested to return to bed to rest despite education on importance of being up and OOB to build up endurance and strength.   Therapy Documentation Precautions:  Precautions Precautions: Fall Restrictions Weight Bearing Restrictions: No  Pain:  reports increased discomfort and heaviness in BUE today.    See Function Navigator for Current Functional Status.   Therapy/Group: Individual Therapy  [name], PT, DPT  10/14/2016, 11:50 AM

## 2016-10-15 ENCOUNTER — Inpatient Hospital Stay (HOSPITAL_COMMUNITY): Payer: PPO | Admitting: Occupational Therapy

## 2016-10-15 ENCOUNTER — Inpatient Hospital Stay (HOSPITAL_COMMUNITY): Payer: PPO | Admitting: Physical Therapy

## 2016-10-15 LAB — BASIC METABOLIC PANEL
Anion gap: 10 (ref 5–15)
BUN: 13 mg/dL (ref 6–20)
CHLORIDE: 95 mmol/L — AB (ref 101–111)
CO2: 31 mmol/L (ref 22–32)
Calcium: 8.8 mg/dL — ABNORMAL LOW (ref 8.9–10.3)
Creatinine, Ser: 0.78 mg/dL (ref 0.44–1.00)
GFR calc Af Amer: >60 mL/min (ref >60)
GFR calc non Af Amer: >60 mL/min (ref >60)
GLUCOSE: 100 mg/dL — AB (ref 65–99)
POTASSIUM: 3.9 mmol/L (ref 3.5–5.1)
Sodium: 136 mmol/L (ref 135–145)

## 2016-10-15 LAB — CBC WITH DIFFERENTIAL/PLATELET
Basophils Absolute: 0 10*3/uL (ref 0.0–0.1)
Basophils Relative: 0 %
EOS PCT: 4 %
Eosinophils Absolute: 0.3 10*3/uL (ref 0.0–0.7)
HCT: 39 % (ref 36.0–46.0)
Hemoglobin: 12.4 g/dL (ref 12.0–15.0)
LYMPHS ABS: 2.1 10*3/uL (ref 0.7–4.0)
LYMPHS PCT: 30 %
MCH: 31.5 pg (ref 26.0–34.0)
MCHC: 31.8 g/dL (ref 30.0–36.0)
MCV: 99 fL (ref 78.0–100.0)
MONO ABS: 1.1 10*3/uL — AB (ref 0.1–1.0)
MONOS PCT: 17 %
Neutro Abs: 3.4 10*3/uL (ref 1.7–7.7)
Neutrophils Relative %: 49 %
PLATELETS: 228 10*3/uL (ref 150–400)
RBC: 3.94 MIL/uL (ref 3.87–5.11)
RDW: 14.9 % (ref 11.5–15.5)
WBC: 6.8 10*3/uL (ref 4.0–10.5)

## 2016-10-15 MED ORDER — BACLOFEN 10 MG PO TABS
10.0000 mg | ORAL_TABLET | Freq: Three times a day (TID) | ORAL | Status: DC
  Administered 2016-10-15 – 2016-10-16 (×4): 10 mg via ORAL
  Filled 2016-10-15 (×4): qty 1

## 2016-10-15 NOTE — Progress Notes (Signed)
Occupational Therapy Session Note  Patient Details  Name: Katelyn Lamb MRN: [id number] Date of Birth: [birth year]  Today's Date: 10/15/2016 OT Individual Time: 0700-0758 OT Individual Time Calculation (min): 58 min    Short Term Goals: Week 1:  OT Short Term Goal 1 (Week 1): Pt will perform toileting with mod A in order to decrease level of assistance with self care. OT Short Term Goal 2 (Week 1): Pt will perform LB dressing with mod A in order to increase I with self care. OT Short Term Goal 3 (Week 1): Pt will demonstrate mod A for standing balance during LB clothing management.  OT Short Term Goal 4 (Week 1): Pt will engage in 10 minutes of functional task with 2 rest breaks or less.   Skilled Therapeutic Interventions/Progress Updates:    Upon entering the room, pt supine in bed with no c/o pain. Pt performed supine >sit with min A and verbal cues for proper technique. Pt performed sit <>stand with mod A and ambulation to bathroom with mod A and without AD. Pt requiring assistance for clothing management. Pt sitting on toilet for 7 minutes but unable to void this session. Pt standing with LOB posteriorly requiring mod A to correct. Pt seated on EOB for self feeding with 1 lb wrist weights donned. Pt having increased difficulty and spillage secondary to plate and tray being disposable. Unable to place plate guard on disposable plate. Pt utilized built up utensil for task. Pt then ambulating and standing at sink to brush teeth with built up handle as well to increase independence with set up for task. Pt returning to bed as she reports increase in lower back pain. RN notified. Call bell and all needed items within reach.   Therapy Documentation Precautions:  Precautions Precautions: Fall Restrictions Weight Bearing Restrictions: No General:   Vital Signs: Therapy Vitals Temp: 97.5 F (36.4 C) Temp Source: Oral Pulse Rate: 84 Resp: 16 BP: 91/62 Patient Position (if appropriate):  Lying Oxygen Therapy SpO2: 98 % O2 Device: Not Delivered Pain:   ADL:   Exercises:   Other Treatments:    See Function Navigator for Current Functional Status.   Therapy/Group: Individual Therapy  [name] 10/15/2016, 4:52 PM

## 2016-10-15 NOTE — Progress Notes (Signed)
PRN vicodin and xanax given at HS, per patient's request. Up to bathroom to void unmeasured amount, PVR=0. Katelyn Lamb A

## 2016-10-15 NOTE — Progress Notes (Signed)
[facility]ICAL MEDICINE & REHABILITATION     PROGRESS NOTE  Subjective/Complaints:  Pt seen laying in bed this AM.  She states she had a good first session with therapies.  Pt states she "slept very, very, very well". Per nursing, family with several questions.   ROS: +Abdnominal tightness, improved. Denies CP, SOB, N/V/D.  Objective: Vital Signs: Blood pressure (!) 86/60, pulse 86, temperature 98.6 F (37 C), temperature source Oral, resp. rate 16, weight 64.6 kg (142 lb 6.4 oz), SpO2 94 %. No results found.  Recent Labs  10/15/16 0442  WBC 6.8  HGB 12.4  HCT 39.0  PLT 228    Recent Labs  10/15/16 0442  NA 136  K 3.9  CL 95*  GLUCOSE 100*  BUN 13  CREATININE 0.78  CALCIUM 8.8*   CBG (last 3)  No results for input(s): GLUCAP in the last 72 hours.  Wt Readings from Last 3 Encounters:  10/10/16 64.6 kg (142 lb 6.4 oz)  10/02/16 63.5 kg (140 lb)  09/20/16 65 kg (143 lb 3.2 oz)    Physical Exam:  BP (!) 86/60 (BP Location: Left Arm)   Pulse 86   Temp 98.6 F (37 C) (Oral)   Resp 16   Wt 64.6 kg (142 lb 6.4 oz)   SpO2 94%   BMI 22.98 kg/m  Constitutional: She appears well-developed. Frail  HENT: Normocephalic and atraumatic.  Eyes: EOMI. No discharge.  Cardiovascular: RRR. No JVD.  Respiratory: Effort normal and breath sounds normal. GI: Soft. Bowel sounds are normal.  Musculoskeletal: She exhibits no edema or tenderness. Atrophy b/l hands. Neurological: She is alert and oriented.  Bilateral sensory loss distal to neck  Bilateral ataxia  Motor: B/l UE: 4-/5 proximal to distal (unchanged). B/l LE: 4/5 proximal to distal Skin: Skin is warm and dry.  Psychiatric: She has a normal mood and affect. Slightly anxious    Assessment/Plan: 1. Functional deficits secondary to incomplete paraplegia which require 3+ hours per day of interdisciplinary therapy in a comprehensive inpatient rehab setting. Physiatrist is providing close team supervision and 24  hour management of active medical problems listed below. Physiatrist and rehab team continue to assess barriers to discharge/monitor patient progress toward functional and medical goals.  Function:  Bathing Bathing position   Position: Shower  Bathing parts Body parts bathed by patient: Right arm, Left arm, Chest, Abdomen, Front perineal area, Right upper leg, Left upper leg, Right lower leg, Left lower leg Body parts bathed by helper: Back, Buttocks  Bathing assist Assist Level: Assistive device, Touching or steadying assistance(Pt > 75%) Assistive Device Comment: bath mitt    Upper Body Dressing/Undressing Upper body dressing   What is the patient wearing?: Pull over shirt/dress     Pull over shirt/dress - Perfomed by patient: Thread/unthread right sleeve Pull over shirt/dress - Perfomed by helper: Thread/unthread left sleeve, Put head through opening, Pull shirt over trunk        Upper body assist Assist Level:  (max A)      Lower Body Dressing/Undressing Lower body dressing   What is the patient wearing?: Non-skid slipper socks, Ted Hose Underwear - Performed by patient: Thread/unthread right underwear leg, Thread/unthread left underwear leg Underwear - Performed by helper: Pull underwear up/down Pants- Performed by patient: Thread/unthread right pants leg Pants- Performed by helper: Thread/unthread left pants leg, Pull pants up/down   Non-skid slipper socks- Performed by helper: Don/doff right sock, Don/doff left sock  TED Hose - Performed by helper: Don/doff right TED hose, Don/doff left TED hose  Lower body assist Assist for lower body dressing: Touching or steadying assistance (Pt > 75%) (mod A)      Toileting Toileting   Toileting steps completed by patient: Performs perineal hygiene Toileting steps completed by helper: Adjust clothing prior to toileting, Adjust clothing after toileting Toileting Assistive Devices: Grab bar or rail  Toileting  assist Assist level: Touching or steadying assistance (Pt.75%)   Transfers Chair/bed transfer   Chair/bed transfer method: Ambulatory Chair/bed transfer assist level: Moderate assist (Pt 50 - 74%/lift or lower) Chair/bed transfer assistive device: Armrests     Locomotion Ambulation     Max distance: 142ft Assist level: Moderate assist (Pt 50 - 74%)   Wheelchair   Type: Manual Max wheelchair distance: 150' Assist Level: Supervision or verbal cues (BLE)  Cognition Comprehension Comprehension assist level: Understands basic 90% of the time/cues < 10% of the time  Expression Expression assist level: Expresses basic 90% of the time/requires cueing < 10% of the time.  Social Interaction Social Interaction assist level: Interacts appropriately 90% of the time - Needs monitoring or encouragement for participation or interaction.  Problem Solving Problem solving assist level: Solves basic 90% of the time/requires cueing < 10% of the time  Memory Memory assist level: Recognizes or recalls 75 - 89% of the time/requires cueing 10 - 24% of the time    Medical Problem List and Plan: 1.  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   Complete Zovirax as directed per infectious disease.  Prednisone taper as directed  Cont CIR 2.  DVT Prophylaxis/Anticoagulation: Monitor platelet counts and any signs of bleeding.    Vascular study showing acute LLE DVT, started Xarelto 3. Pain Management: Neurontin 100 mg 3 times a day, Valium 5 mg every 4 hours as needed muscle spasms, hydrocodone as needed 4. Mood: Xanax 1 mg daily as needed 5. Neuropsych: This patient is capable of making decisions on her own behalf. 6. Skin/Wound Care: Routine skin checks 7. Fluids/Electrolytes/Nutrition: Routine I&Os  BMP within acceptable range on 2/12 8. GERD. Protonix 9. Constipation. Laxative assistance.  Increased bowel reg on 2/9  Improving 9. ABLA: Resolved  Hb 12.4  on 2/12  Cont to monitor 10. Neurogenic bladder  Monitor with I/O caths PRN  PVRs fluctuating  Bladder scans   Will consider meds if consistent 11. Abd muscle spasms  Confounded by UTI  Baclofen 5 TID trail 2/11, increased to 10 on 2/12  Appears to be improving 12. Acute lower UTI  UA+, Ucx Klebsiella Oxytoca  Macrobid 2/8-2/14  >35 minutes spent with >30 minutes with patient and later family educating on patients medical condition and as well as addressing questions regarding urinary symptoms, discharge plan, pain, etc.  LOS (Days) 7 A FACE TO FACE EVALUATION WAS PERFORMED  Ankit Lorie Phenix 10/15/2016 10:00 AM

## 2016-10-15 NOTE — Progress Notes (Signed)
Physical Therapy Weekly Progress Note  Patient Details  Name: Katelyn Lamb MRN: [id number] Date of Birth: 1946/01/09  Beginning of progress report period: October 09, 2016 End of progress report period: October 15, 2016  Today's Date: 10/15/2016  Patient has met 1 of 2 short term goals.  Pt making slow gains during her current course of treatment. She continues to require mod assist for all functional transfers and min to mod assist for gait without AD. She is making mild improvements in balance however continues to demonstrate significant posterior sway with poor self correction. She would continue to benefit from skilled PT to continue to progress safety with balance, gait and functional activities.   Patient continues to demonstrate the following deficits muscle weakness and muscle paralysis, decreased cardiorespiratoy endurance, impaired timing and sequencing, unbalanced muscle activation, ataxia and decreased coordination and decreased standing balance, decreased postural control and decreased balance strategies and therefore will continue to benefit from skilled PT intervention to increase functional independence with mobility.  Patient progressing toward long term goals..  Continue plan of care.  PT Short Term Goals Week 1:  PT Short Term Goal 1 (Week 1): Pt will consistently perform functional transers iwth min A PT Short Term Goal 1 - Progress (Week 1): Not met PT Short Term Goal 2 (Week 1): pt with gait in controlled enivonrment iwht min A 50' PT Short Term Goal 2 - Progress (Week 1): Met Week 2:  PT Short Term Goal 1 (Week 2): Pt will be able to perform basic transfers with min assist PT Short Term Goal 2 (Week 2): Pt will be able to gait x 150' with min assist PT Short Term Goal 3 (Week 2): Pt will be able to perform 4 stairs with mod assist  Skilled Therapeutic Interventions/Progress Updates:  Ambulation/gait training;Discharge planning;DME/adaptive equipment  instruction;Functional mobility training;Pain management;Splinting/orthotics;Therapeutic Activities;UE/LE Strength taining/ROM;UE/LE Coordination activities;Therapeutic Exercise;Stair training;Patient/family education;Neuromuscular re-education;Functional electrical stimulation;Community reintegration;Balance/vestibular training;Disease management/prevention;Psychosocial support;Wheelchair propulsion/positioning;Cognitive remediation/compensation   Therapy Documentation Precautions:  Precautions Precautions: Fall Restrictions Weight Bearing Restrictions: No   See Function Navigator for Current Functional Status.    [place], PT, DPT  10/16/2016, 7:52 AM

## 2016-10-15 NOTE — Plan of Care (Signed)
Problem: RH PAIN MANAGEMENT Goal: RH STG PAIN MANAGED AT OR BELOW PT'S PAIN GOAL <3  Outcome: Not Progressing Consistently rating pain > 3.

## 2016-10-15 NOTE — Progress Notes (Signed)
Physical Therapy Session Note  Patient Details  Name: Katelyn Lamb MRN: [id number] Date of Birth: [birth year]  Today's Date: 10/15/2016 PT Individual Time: 1045-1200 PT Individual Time Calculation (min): 75 min   Short Term Goals: Week 1:  PT Short Term Goal 1 (Week 1): Pt will consistently perform functional transers iwth min A PT Short Term Goal 1 - Progress (Week 1): Not met PT Short Term Goal 2 (Week 1): pt with gait in controlled enivonrment iwht min A 50' PT Short Term Goal 2 - Progress (Week 1): Met  Skilled Therapeutic Interventions/Progress Updates: Pt presented in w/c agreeable to therapy. Pt indicating increased tightness across waist and legs. Sit to/from stand x5 from mat with cues for place hands on legs for push through. Tactile cues for anterior wt shift. Performed anterior/neutral wt shift on tilt board. With max cues for full knee extension and improved posture. Ambulated with HHA 157f with cues to stay on task as pt's gait quality would decrease once distracted. Pt with verbal cues for increasing BOS and maintaining forward gaze. Pt performed seated UE forced movement for seated balance with hand over hand movement. Performed NuStep L3 x 144m with hand straps for reciprocal movement and endurance. Pt able to ambulate back to room with HHA and decreased sway. Pt request to return to bed, sit to supine with modA for LE placement. Pt left in bed with alarm on and all current needs met.      Therapy Documentation Precautions:  Precautions Precautions: Fall Restrictions Weight Bearing Restrictions: No   See Function Navigator for Current Functional Status.   Therapy/Group: Individual Therapy  Fabrizio Filip  Katelyn Lamb, PTA  10/15/2016, 3:52 PM

## 2016-10-15 NOTE — Progress Notes (Signed)
Occupational Therapy Session Note  Patient Details  Name: Katelyn Lamb MRN: [id number] Date of Birth: [birth year]  Today's Date: 10/15/2016 OT Individual Time: [id number] Individual Time Calculation (min): 62 min    Short Term Goals: Week 1:  OT Short Term Goal 1 (Week 1): Pt will perform toileting with mod A in order to decrease level of assistance with self care. OT Short Term Goal 2 (Week 1): Pt will perform LB dressing with mod A in order to increase I with self care. OT Short Term Goal 3 (Week 1): Pt will demonstrate mod A for standing balance during LB clothing management.  OT Short Term Goal 4 (Week 1): Pt will engage in 10 minutes of functional task with 2 rest breaks or less.     Skilled Therapeutic Interventions/Progress Updates: Pt was lying in bed at time of arrival, requesting shower. Tx focus on bilateral UE coordination, standing balance, sit<stands and adaptive bathing/dressing skills. Pt ambulated to bathroom with Mod A HHA to tub bench, 2 LOBs with therapist correction. PICC site covered. Bathing completed with wash mit, pt able to wash LEs in figure 4 position and also buttocks when standing with min guard. Mod A sit<stand. Afterwards pt completed dressing w/c level at sink. Mod A required for donning shirt and Max A for LB dressing/footwear with cues on adaptive techniques. She may benefit from having 1# wrist weights during dressing to increase independence. At end of session pt was left in w/c with all needs within reach.      Therapy Documentation Precautions:  Precautions Precautions: Fall Restrictions Weight Bearing Restrictions: No  Pain: No c/o pain during session  Pain Assessment Pain Assessment: 0-10 Pain Score: 6  Pain Type: Other (Comment);Acute pain Pain Location: Bladder ADL:      See Function Navigator for Current Functional Status.   Therapy/Group: Individual Therapy  [place] Creswell A Krishay Faro 10/15/2016, 12:31 PM

## 2016-10-16 ENCOUNTER — Inpatient Hospital Stay (HOSPITAL_COMMUNITY): Payer: PPO | Admitting: Physical Therapy

## 2016-10-16 ENCOUNTER — Inpatient Hospital Stay (HOSPITAL_COMMUNITY): Payer: PPO | Admitting: Occupational Therapy

## 2016-10-16 DIAGNOSIS — R109 Unspecified abdominal pain: Secondary | ICD-10-CM

## 2016-10-16 MED ORDER — GABAPENTIN 300 MG PO CAPS
300.0000 mg | ORAL_CAPSULE | Freq: Three times a day (TID) | ORAL | Status: DC
  Administered 2016-10-16: 300 mg via ORAL
  Filled 2016-10-16: qty 1

## 2016-10-16 MED ORDER — GABAPENTIN 100 MG PO CAPS
100.0000 mg | ORAL_CAPSULE | Freq: Three times a day (TID) | ORAL | Status: DC
  Administered 2016-10-16 – 2016-10-18 (×5): 100 mg via ORAL
  Filled 2016-10-16 (×5): qty 1

## 2016-10-16 NOTE — Progress Notes (Signed)
Late entry: Husband concerned about pt's increased lethargy, and decrease balance. Husband indicated that he noticed this increase of lethargy and confusion over the weekend, and that it appears to be worse today. Husband indicated that he would like MD to review, and discontinue some of the medication that he felt was contributing to these symptoms. Spoke to Algis Liming, MD. Jeannene Patella to review. Pam also advised nurse to tell husband to discuss with Dr. Posey Pronto in the morning. Husband indicated that his son has been trying to get in touch with Dr. Posey Pronto. However, they have been playing 'phone tag', and thus have been unsuccessful in their attempts at this time.

## 2016-10-16 NOTE — Progress Notes (Signed)
[facility] PHYSICAL MEDICINE & REHABILITATION     PROGRESS NOTE  Subjective/Complaints:  Pt seen sitting up in bed, eating breakfast with OT.  She states she is having difficulty this AM, confirmed with therapy.  She slept well this AM.  She complains of burning in her legs. She had an incontinent episode overnight.   ROS: +LE burning. Denies CP, SOB, N/V/D.  Objective: Vital Signs: Blood pressure 94/63, pulse 83, temperature 97.8 F (36.6 C), temperature source Oral, resp. rate 18, weight 64.6 kg (142 lb 6.4 oz), SpO2 98 %. No results found.  Recent Labs  10/15/16 0442  WBC 6.8  HGB 12.4  HCT 39.0  PLT 228    Recent Labs  10/15/16 0442  NA 136  K 3.9  CL 95*  GLUCOSE 100*  BUN 13  CREATININE 0.78  CALCIUM 8.8*   CBG (last 3)  No results for input(s): GLUCAP in the last 72 hours.  Wt Readings from Last 3 Encounters:  10/10/16 64.6 kg (142 lb 6.4 oz)  10/02/16 63.5 kg (140 lb)  09/20/16 65 kg (143 lb 3.2 oz)    Physical Exam:  BP 94/63 (BP Location: Left Arm)   Pulse 83   Temp 97.8 F (36.6 C) (Oral)   Resp 18   Wt 64.6 kg (142 lb 6.4 oz)   SpO2 98%   BMI 22.98 kg/m  Constitutional: She appears well-developed. Frail  HENT: Normocephalic and atraumatic.  Eyes: EOMI. No discharge.  Cardiovascular: RRR. No JVD.  Respiratory: Effort normal and breath sounds normal. GI: Soft. Bowel sounds are normal.  Musculoskeletal: She exhibits no edema or tenderness. Atrophy b/l hands. Neurological: She is alert and oriented.  Bilateral sensory loss distal to neck  Bilateral ataxia, increased this AM. Motor: B/l UE: 4-/5 proximal to distal (unchanged). B/l LE: 4/5 proximal to distal Skin: Skin is warm and dry.  Psychiatric: She has a normal mood and affect.   Assessment/Plan: 1. Functional deficits secondary to incomplete paraplegia which require 3+ hours per day of interdisciplinary therapy in a comprehensive inpatient rehab setting. Physiatrist is providing close  team supervision and 24 hour management of active medical problems listed below. Physiatrist and rehab team continue to assess barriers to discharge/monitor patient progress toward functional and medical goals.  Function:  Bathing Bathing position   Position: Shower  Bathing parts Body parts bathed by patient: Right arm, Left arm, Chest, Abdomen, Front perineal area, Right upper leg, Left upper leg, Right lower leg, Left lower leg, Buttocks Body parts bathed by helper: Back  Bathing assist Assist Level: Assistive device, Touching or steadying assistance(Pt > 75%) Assistive Device Comment: bath mitt    Upper Body Dressing/Undressing Upper body dressing   What is the patient wearing?: Pull over shirt/dress     Pull over shirt/dress - Perfomed by patient: Thread/unthread right sleeve, Thread/unthread left sleeve, Put head through opening Pull over shirt/dress - Perfomed by helper: Pull shirt over trunk        Upper body assist Assist Level: Touching or steadying assistance(Pt > 75%)      Lower Body Dressing/Undressing Lower body dressing   What is the patient wearing?: Pants, Non-skid slipper socks, Ted Hose Underwear - Performed by patient: Thread/unthread right underwear leg, Thread/unthread left underwear leg Underwear - Performed by helper: Pull underwear up/down Pants- Performed by patient: Thread/unthread right pants leg Pants- Performed by helper: Thread/unthread left pants leg, Pull pants up/down, Thread/unthread right pants leg   Non-skid slipper socks- Performed by helper: Don/doff  right sock, Don/doff left sock               TED Hose - Performed by helper: Don/doff right TED hose, Don/doff left TED hose  Lower body assist Assist for lower body dressing:  (Max-Total A)      Toileting Toileting   Toileting steps completed by patient: Performs perineal hygiene Toileting steps completed by helper: Adjust clothing prior to toileting, Adjust clothing after  toileting Toileting Assistive Devices: Grab bar or rail  Toileting assist Assist level: Touching or steadying assistance (Pt.75%)   Transfers Chair/bed transfer   Chair/bed transfer method: Stand pivot Chair/bed transfer assist level: Moderate assist (Pt 50 - 74%/lift or lower) Chair/bed transfer assistive device: Armrests     Locomotion Ambulation     Max distance: 115ft Assist level: Touching or steadying assistance (Pt > 75%)   Wheelchair   Type: Manual Max wheelchair distance: 150' Assist Level: Supervision or verbal cues (BLE)  Cognition Comprehension Comprehension assist level: Understands basic 90% of the time/cues < 10% of the time  Expression Expression assist level: Expresses basic 90% of the time/requires cueing < 10% of the time.  Social Interaction Social Interaction assist level: Interacts appropriately 90% of the time - Needs monitoring or encouragement for participation or interaction.  Problem Solving Problem solving assist level: Solves basic 90% of the time/requires cueing < 10% of the time  Memory Memory assist level: Recognizes or recalls 75 - 89% of the time/requires cueing 10 - 24% of the time    Medical Problem List and Plan: 1.  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   Complete Zovirax as directed per infectious disease.  Prednisone taper as directed  Cont CIR 2.  DVT Prophylaxis/Anticoagulation: Monitor platelet counts and any signs of bleeding.    Vascular study showing acute LLE DVT, started Xarelto 3. Pain Management:   Neurontin 100 mg 3 times a day, increased to 300 on 2/13  Valium 5 mg every 4 hours as needed muscle spasms, hydrocodone as needed 4. Mood: Xanax 1 mg daily as needed 5. Neuropsych: This patient is capable of making decisions on her own behalf. 6. Skin/Wound Care: Routine skin checks 7. Fluids/Electrolytes/Nutrition: Routine I&Os  BMP within acceptable range on 2/12 8. GERD.  Protonix 9. Constipation. Laxative assistance.  Increased bowel reg on 2/9  Improving 9. ABLA: Resolved  Hb 12.4 on 2/12  Cont to monitor 10. Neurogenic bladder  Monitor with I/O caths PRN  PVRs cont to fluctuate  Bladder scans   Will consider meds if consistently elevated after abx completed 11. Abd muscle spasms  Confounded by UTI  Baclofen 5 TID trail 2/11, increased to 10 on 2/12  Appears to be improving 12. Acute lower UTI  UA+, Ucx Klebsiella Oxytoca  Macrobid 2/8-2/14   LOS (Days) 8 A FACE TO FACE EVALUATION WAS PERFORMED  Doyce Stonehouse Lorie Phenix 10/16/2016 10:47 AM

## 2016-10-16 NOTE — Progress Notes (Signed)
Nurse call to update on patient's lethargy this afternoon. Husband concerned about multiple medications and their sedating effects. He would like baclofen discontinued. Will discontinue and decrease gabapentin back to 100 mg tid.  Advised nurse to have husband follow up with MD during am rounds tomorrow.

## 2016-10-16 NOTE — Progress Notes (Signed)
Occupational Therapy Weekly Progress Note  Patient Details  Name: Katelyn Lamb MRN: [id number] Date of Birth: March 28, 1946  Beginning of progress report period: October 09, 2016 End of progress report period: October 16, 2016  Today's Date: 10/16/2016 OT Individual Time: 1740-8144 OT Individual Time Calculation (min): 74 min    Patient has met 2 of 4 short term goals. Pt making steady progress this week towards occupational therapy goals. Pt continues to ambulate and transfer without use of AD and needing mod A for safety. Pt has been able to shower with use of bath mitt to increase independence with task. Pt has increased self feeding skills to minimal spillage with use of 1 lb wrist weights, plate guard, weighted cups, and built up handles for utensils.Pt continues to benefit from OT intervention at this time.   Patient continues to demonstrate the following deficits: muscle weakness, decreased cardiorespiratoy endurance, decreased coordination and decreased motor planning and decreased sitting balance, decreased standing balance and decreased balance strategies and therefore will continue to benefit from skilled OT intervention to enhance overall performance with BADL.  Patient progressing toward long term goals..  Continue plan of care.  OT Short Term Goals Week 1:  OT Short Term Goal 1 (Week 1): Pt will perform toileting with mod A in order to decrease level of assistance with self care. OT Short Term Goal 1 - Progress (Week 1): Not met OT Short Term Goal 2 (Week 1): Pt will perform LB dressing with mod A in order to increase I with self care. OT Short Term Goal 2 - Progress (Week 1): Not met OT Short Term Goal 3 (Week 1): Pt will demonstrate mod A for standing balance during LB clothing management.  OT Short Term Goal 3 - Progress (Week 1): Met OT Short Term Goal 4 (Week 1): Pt will engage in 10 minutes of functional task with 2 rest breaks or less.  OT Short Term Goal 4 - Progress  (Week 1): Met Week 2:  OT Short Term Goal 1 (Week 2): Pt will perform toileting with mod A balance while pt performs clothing management with increased time. OT Short Term Goal 2 (Week 2): Pt will perform LB dressing with mod A in order to decrease level of assist with self care.  OT Short Term Goal 3 (Week 2): Pt will perform grooming while standing at sink with min A for balance.   Skilled Therapeutic Interventions/Progress Updates:    Upon entering the room, pt having difficulty waking from sleep. Once pt attempts to exit bed, OT notes that pt's clothing and bed linen saturated with urine and pt unaware. Sit >stand with mod A for lifting and pt with posterior lean in standing needing max cues and manual facilitation for anterior weight shift. Pt ambulated with mod A without use of AD to bathroom. Soiled clothing removed while seated on toilet. Pt able to void again once seated on toilet. Pt needing total A for LB clothing management and min A for UB clothing management. Pt ambulated to sink to wash hands with set up A. Pt returning to sit on EOB for meal with plate guard placed, 1 lb wrist weights donned bilaterally, and built up utensil. Pt with minimal spillage this session and required cues to use both hands to bring cup to mouth. Sign placed above bed that pt is to feed self for each meal to continue to improve. Pt standing at sink to brush teeth with built up toothbrush handle. Pt needing cues  as she bends at waist and knees being to bend while standing and pt needing cues for upright posture. Pt refused to sit in wheelchair and returns to bed at end of session. Call bell and all needed items within reach upon exiting the room.   Therapy Documentation Precautions:  Precautions Precautions: Fall Restrictions Weight Bearing Restrictions: No  See Function Navigator for Current Functional Status.   Therapy/Group: Individual Therapy  [name] 10/16/2016, 8:54 AM

## 2016-10-16 NOTE — Progress Notes (Signed)
Physical Therapy Note  Patient Details  Name: KATOYA OBLANDER MRN: [id number] Date of Birth: [birth year] Today's Date: 10/16/2016    Time: [id number] 55 minutes  1:1 No c/o pain at rest, pt c/o some increased back pain with prolonged standing, eased with rest.  Gait throughout unit with handheld assist, min A, continues with slight ataxia but no LOB during gait.  Supine NMR with focus on core control and closed chain movements of UEs and LEs.  Pt with improving hip and core control during closed chain activities, requires max manual facilitation to carry over to open chain tasks.  Toilet transfers and multiple reps of sit to stand training with min/mod A to lift and lower, focusing on eccentric control pt able to progress to min A.   Bruk Tumolo 10/16/2016, 3:58 PM

## 2016-10-16 NOTE — Progress Notes (Signed)
Physical Therapy Session Note  Patient Details  Name: Katelyn Lamb MRN: [id number] Date of Birth: [birth year]  Today's Date: 10/16/2016 PT Individual Time: 0900-1015 PT Individual Time Calculation (min): 75 min   Short Term Goals: Week 2:  PT Short Term Goal 1 (Week 2): Pt will be able to perform basic transfers with min assist PT Short Term Goal 2 (Week 2): Pt will be able to gait x 150' with min assist PT Short Term Goal 3 (Week 2): Pt will be able to perform 4 stairs with mod assist  Skilled Therapeutic Interventions/Progress Updates:  Pt presented in bed asleep. Easy to awake however continued to be lethargic throughout session. Pt c/o "stiffness" in low back and hips. Instructed pt in hooklying LTR and heel slides x 10 bilaterally.  Pt performed supine to sit with mod cues for sequencing. Sit to stand with modA for significant posterior lean. Cues to increase forefoot wt bearing with fair carryover. Gait with HHA with mod progressing to minA with cues for increasing BOS and increased TKE in stance phase. NMR standing at mat with bench placed on top. Wt bearing through elbow with cues for keeping head elevated and chest forward. Pt with increased difficulty following single step commands despite verbal cues with pt placing self in seated position. Pt with improved technique after seated rest and having pt verbalize sequencing. Pt performed standing with elbows on bench and arms straight. Pt returned to room and performed sit to supine with minA.  Pt left in bed with call bell within reach and all current needs met.       Therapy Documentation Precautions:  Precautions Precautions: Fall Restrictions Weight Bearing Restrictions: No   See Function Navigator for Current Functional Status.   Therapy/Group: Individual Therapy  Obbie Lewallen  Maicee Ullman, PTA  10/16/2016, 12:12 PM

## 2016-10-17 ENCOUNTER — Ambulatory Visit (HOSPITAL_COMMUNITY): Payer: PPO | Admitting: Psychology

## 2016-10-17 ENCOUNTER — Inpatient Hospital Stay (HOSPITAL_COMMUNITY): Payer: PPO | Admitting: Occupational Therapy

## 2016-10-17 ENCOUNTER — Inpatient Hospital Stay (HOSPITAL_COMMUNITY): Payer: PPO | Admitting: Physical Therapy

## 2016-10-17 DIAGNOSIS — G8222 Paraplegia, incomplete: Secondary | ICD-10-CM

## 2016-10-17 DIAGNOSIS — F419 Anxiety disorder, unspecified: Secondary | ICD-10-CM

## 2016-10-17 DIAGNOSIS — B0082 Herpes simplex myelitis: Secondary | ICD-10-CM

## 2016-10-17 DIAGNOSIS — G0490 Encephalitis and encephalomyelitis, unspecified: Secondary | ICD-10-CM

## 2016-10-17 NOTE — Patient Care Conference (Signed)
Inpatient RehabilitationTeam Conference and Plan of Care Update Date: 10/17/2016   Time: 11:40 AM    Patient Name: Katelyn Lamb      Medical Record Number: [id number]  Date of Birth: 01-26-1946 Sex: Female         Room/Bed: 4W17C/4W17C-01 Payor Info: Payor: [employer] / Plan: [employer] / Product Type: *No Product type* /    Admitting Diagnosis: Tetraplegia Mydelitis  Admit Date/Time:  10/08/2016  6:00 PM Admission Comments: No comment available   Primary Diagnosis:  <principal problem not specified> Principal Problem: <principal problem not specified>  Patient Active Problem List   Diagnosis Date Noted  . Abdominal spasms   . Constipation due to pain medication   . Acute lower UTI   . Dysuria   . Acute deep vein thrombosis (DVT) of popliteal vein of left lower extremity ([place])   . Incomplete paraplegia ([place])   . Acute blood loss anemia   . Neurogenic bladder   . Myelitis due to herpes simplex ([place])   . Neuropathic pain   . Muscle spasm   . Gastroesophageal reflux disease   . Slow transit constipation   . Thrombocytopenia ([place]) 10/03/2016  . Anxiety 10/03/2016  . Abnormal MRI, spinal cord   . Encephalitis and encephalomyelitis   . Myelitis ([place])   . Numbness   . Intractable back pain 09/20/2016  . Numbness of left lower extremity 09/20/2016  . Hyponatremia 09/20/2016  . Herpes zoster 09/20/2016    Expected Discharge Date: Expected Discharge Date: 10/26/16  Team Members Present: Physician leading conference: Dr. 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 LBM 10/14/16  Mananged bowel and bladder  Monitor   Swallow/Nutrition/ Hydration             ADL's   set up A grooming, feeding self with set up A - min A, UB self care min A, LB self care max - total A, functional transfers mod A without AD, pt needing AE and 1 lb wrist weights for self feeding  Supervision overall  self care retraining, balance, endurance, functional transfers, AE training   Mobility   ModA sit/stand transfers, min/modA gait without AD  supervision overall  balance, coordination, transfers, gait   Communication             Safety/Cognition/ Behavioral Observations            Pain   Norco 1-2 tabs Q4hr prn, Bacolfen 10mg  BID, Neurotin 300mg  TID  <3  Monitor for lethargy, and increased confusion   Skin   Bruising to arms and abdomen  No skin breakdown  Assess and encourage change of position q 2hrs    Rehab Goals Patient on target to meet rehab goals: Yes *See Care Plan and progress notes for long and short-term goals.  Barriers to Discharge: Mobility, transfers, DVT, neurogenic bladder, abd spasms, UTI    Possible Resolutions to Barriers:  Abx, monitor bladder, therapies    Discharge Planning/Teaching Needs:  Plan home with spouse who can provide 24/7 assistance.  ongoing  Team Discussion:  UTI - treating;  Made changes to decrease overall pain meds.  Need to monitor bladder fxn/  Timed toileting?  Using 1# weight when feeding to minimize ataxia and other AE for ADLs.   Now with a posterior lean and NO AWARENESS or ability to correct.  ADLs min - total assist.  Anticipate decreasing goals to min assist overall  Revisions to Treatment Plan:  Downgrading most goals to min assist   Continued Need for Acute Rehabilitation Level of Care: The patient requires daily medical management by a physician with specialized training in physical medicine and rehabilitation for the following  conditions: Daily direction of a multidisciplinary physical rehabilitation program to ensure safe treatment while eliciting the highest outcome that is of practical value to the patient.: Yes Daily medical management of patient stability for increased activity during participation in an intensive rehabilitation regime.: Yes Daily analysis of laboratory values and/or radiology reports with any subsequent need for medication adjustment of medical intervention for : Neurological problems;Urological problems;Other  Katelyn Lamb 10/17/2016, 2:34 PM

## 2016-10-17 NOTE — Progress Notes (Signed)
Neuropsychological consultation  Patient:   Katelyn Lamb   DOB:   [birth year]  MR Number:  [id number]  Location:  [facility] [id number] [place] [address] Dept: [id number]           Date of Service:   10/17/2016  Start Time:   2:45 PM End Time:   3:40 PM  Provider/Observer:  Edgardo Roys PSYD       Billing Code/Service: [phone]  Chief Complaint:     Chief Complaint  Patient presents with  . Anxiety  . Stress  . Depression    Reason for Service:  The patient was referred for psychological/neuropsychological consultation.  The patient has had some significant issues with anxiety as well as lethargy and depression like symptoms. The [facility] to the development of a herpes class viral infection that apparently involved her spinal cord. The patient reports that she started feeling pain in early January particularly in her hip and then later her abdomen. She had been planning a trip to [place] at the end of January to see a friend whose husband had passed away. She was insistent on trying to make that trip and she did travel on this trip but developed such pain that she had to be taken off the airplane on her return in a wheelchair. After couple of days returning in early February she went to the emergency department and they tried to address her pain but could not identify exactly what was going on. She returned to the emergency department a few days later. Eventually, the etiological factor was identified and she was started on antiviral medications. However, she had significant neurological symptoms in her arms and legs. She continues to have significant fine motor control issues in both of her hands. She also has continued weakness and other issues with her legs. The patient has experienced a lot of anxiety and worry about everything that has gone on.  The referral was to address some coping skills and issues related to her emotional response to these very significant medical issues.  Current Status:  The patient describes an improving but still problematic issue with her hands and arms. She reports significant fatigue in her legs and weakness in her legs. She reports that her pain symptoms have improved more recently. She denies any memory issues are attention and concentration issues or other cognitive disturbance.  Reliability of Information: Formation is provided by the patient as well as communication with staff and review of available medical record.  Behavioral Observation: TIKKI MCKOWEN  presents as a 71 y.o.-year-old Right Caucasian Female who appeared her stated age. her dress was Appropriate and she was Well Groomed and her manners were Appropriate to the situation.  her participation was indicative of Appropriate behaviors.  There were  physical disabilities noted.  she displayed an appropriate level of cooperation and motivation.     Interactions:    Active Appropriate, Attentive and Sharing  Attention:   within normal limits and attention span and concentration were age appropriate  Memory:   within normal limits; recent and remote memory intact  Visuo-spatial:  within normal limits  Speech (Volume):  low  Speech:   normal; normal  Thought Process:  Coherent  Though Content:  WNL;   Orientation:   person, place, time/date and situation  Judgment:   Good  Planning:   Good  Affect:  Anxious and Depressed  Mood:    Anxious and Depressed  Insight:   Good  Intelligence:   normal  Marital Status/Living: The patient is married and her husband has been actively helping her. The patient's husband was here at her room today when I had my clinical interview.  Substance Use:  No concerns of substance abuse are reported.    Education:   Engineering geologist History:   Past Medical History:  Diagnosis Date  . Anxiety    . Back pain   . 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 acetaminophen (TYLENOL) tablet 650 mg   Or  . acetaminophen (TYLENOL) suppository 650 mg  . albuterol (PROVENTIL) (2.5 MG/3ML) 0.083% nebulizer solution 2.5 mg  . ALPRAZolam (XANAX) tablet 1 mg  . diazepam (VALIUM) tablet 5 mg  . feeding supplement (ENSURE ENLIVE) (ENSURE ENLIVE) liquid 237 mL  . gabapentin (NEURONTIN) capsule 100 mg  . HYDROcodone-acetaminophen (NORCO) 10-325 MG per tablet 1-2 tablet  . nitrofurantoin (macrocrystal-monohydrate) (MACROBID) capsule 100 mg  . ondansetron (ZOFRAN) tablet 4 mg   Or  . ondansetron (ZOFRAN) injection 4 mg  . pantoprazole (PROTONIX) EC tablet 40 mg  . polyethylene glycol (MIRALAX / GLYCOLAX) packet 17 g  . Rivaroxaban (XARELTO) tablet 15 mg   Followed by  . [START ON 10/31/2016] rivaroxaban (XARELTO) tablet 20 mg  . sodium chloride flush (NS) 0.9 % injection 10-40 mL  . sorbitol 70 % solution 30 mL   Outpatient Encounter Prescriptions as of 10/17/2016  Medication Sig  . ALPRAZolam (XANAX) 1 MG tablet Take 1 mg by mouth at bedtime as needed for anxiety or sleep.   . calcium carbonate (OS-CAL) 600 MG TABS tablet Take 600 mg by mouth 2 (two) times daily with a meal.  . cholecalciferol (VITAMIN D) 1000 units tablet Take 1,000 Units by mouth daily.  . diazepam (VALIUM) 5 MG tablet Take 1 tablet (5 mg total) by mouth 2 (two) times daily. (Patient taking differently: Take 5 mg by mouth every 12 (twelve) hours as needed for muscle spasms. )  . diphenhydramine-acetaminophen (TYLENOL PM) 25-500 MG TABS tablet Take 1 tablet by mouth at bedtime as needed (sleep/pain).  Marland Kitchen gabapentin (NEURONTIN) 100 MG capsule Take 100 mg by mouth 3 (three) times daily.  Marland Kitchen HYDROcodone-acetaminophen (NORCO) 10-325 MG per tablet Take 1 tablet by mouth every 8 (eight) hours as needed for moderate pain.  Marland Kitchen ketorolac (TORADOL) 10 MG tablet  Take 1 tablet (10 mg total) by mouth every 6 (six) hours as needed. (Patient not taking: Reported on 10/02/2016)  . omega-3 acid ethyl esters (LOVAZA) 1 G capsule Take by mouth 2 (two) times daily.          Sexual History:   History  Sexual Activity  . Sexual activity: Not on file    Psychiatric History:  The patient does have a prior history of depression anxiety. She had been treated in the past with amitriptyline which was helpful. There is also family history of depression as well (sister). The patient reports that she has had issues in the past related to symptoms possibly related to irritable bowel syndrome or dumping syndrome that has had some concern about whether there was a psychological component related to depression anxiety or not. She did respond to amitriptyline for both the treatment of her depression years ago as well as the rheumatologist using it to help with  her long-term diarrhea issues.  Family Med/Psych History:  Family History  Problem Relation Age of Onset  . Hypertension Mother     Risk of Suicide/Violence: virtually non-existent   Impression/DX:  The patient is status post viral infection that involved her spinal cord. She had significant weakness and motor control issues with both her hands and her legs as well as symptoms in the muscles of her stomach. The patient is had a lot of anxiety and worry recently with all of the events that have been going on but also has a prior history of anxiety.  Disposition/Plan:  We worked on issues related to coping with her anxiety and the medical issues she has been going through. The patient requested that we meet again next week and I agreed to see her again next week to continue to work on coping skills and strategies.  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

## 2016-10-17 NOTE — Progress Notes (Signed)
Occupational Therapy Session Note  Patient Details  Name: Katelyn Lamb MRN: [id number] Date of Birth: 1946-03-08  Today's Date: 10/17/2016 OT Individual Time: 0930-1030 and  1302-1330 OT Individual Time Calculation (min):60 min and  28 min    Short Term Goals: Week 2:  OT Short Term Goal 1 (Week 2): Pt will perform toileting with mod A balance while pt performs clothing management with increased time. OT Short Term Goal 2 (Week 2): Pt will perform LB dressing with mod A in order to decrease level of assist with self care.  OT Short Term Goal 3 (Week 2): Pt will perform grooming while standing at sink with min A for balance.   Skilled Therapeutic Interventions/Progress Updates:  Session 1: Upon entering the room, pt supine in bed but agreeable to OT intervention. Pt performs supine >sit with min A to EOB. Pt ambulated to bathroom with mod A and without use of AD. Pt seated on TTB in walk in shower with mod A for lowering assistance. Pt washing self while seated and bath mitt placed on R UE. Pt needing steady assist for sitting balance when crossing LE over knee to wash feet. Pt standing from seat with mod A and therapist assisted pt in washing buttocks. Pt sitting and drying from shower seat for energy conservation. Pt ambulating to sit on EOB to don clothing items. Pt unable to grasp LB clothing to manage pulling pants up and needing total A for task. Pt returned to bed at end of session as she reports 8/10 lower back pain. RN notified and medication given. Call bell and all needed items within reach upon exiting the room.   Session 2: Upon entering the room, pt seated on EOB with husband present in the room. Skilled OT intervention with focus on functional mobility, transfers, and pt/family education. Pt performed sit <>stand with mod A and ambulates from room to ADL apartment, 150', without use of AD and mod A overall for safety and balance. Pt continues to display posterior lean in standing. Pt  needing mod cues for forward gaze. OT educated and demonstrated shower transfer with use of TTB. Pt returned demonstrations with steady assistance needed for balance and min verbal cues for proper technique. OT also recommended purchase of safety treads to decrease fall risk. Pt returned to room in same manner as above and continued lunch with husbands assistance.   Therapy Documentation Precautions:  Precautions Precautions: Fall Restrictions Weight Bearing Restrictions: No General:   Vital Signs: Therapy Vitals Temp: 97.9 F (36.6 C) Temp Source: Oral Pulse Rate: 98 Resp: 18 BP: 93/66 Patient Position (if appropriate): Lying Oxygen Therapy SpO2: 94 % O2 Device: Not Delivered Pain:   ADL:   Exercises:   Other Treatments:    See Function Navigator for Current Functional Status.   Therapy/Group: Individual Therapy  [name] 10/17/2016, 4:05 PM

## 2016-10-17 NOTE — Progress Notes (Signed)
Physical Therapy Session Note  Patient Details  Name: Katelyn Lamb MRN: [id number] Date of Birth: 1946/09/03  Today's Date: 10/17/2016 PT Individual Time: 0800-0900, 1410-1435 PT Individual Time Calculation (min): 60 min and 25 min  Short Term Goals: Week 2:  PT Short Term Goal 1 (Week 2): Pt will be able to perform basic transfers with min assist PT Short Term Goal 2 (Week 2): Pt will be able to gait x 150' with min assist PT Short Term Goal 3 (Week 2): Pt will be able to perform 4 stairs with mod assist  Skilled Therapeutic Interventions/Progress Updates: Tx1: Pt presented in bed having not eaten breakfast. Provided emotional support due to pt stating had "bad night" feeling unable to move limbs. Supine to sit with modA decreased coordination in [place]. Donned pants, socks, shoes total assist for time management. Sit to stand modA, pt requiring mod/max verbal/tactile cues for decreased posterior lean. Encouraged to eat breakfast while pt sitting in w/c with adaptive equipment. Pt requesting to use toilet after breakfast, modA sit to stand from w/c with verbal cues to decrease posterior lean, requiring tactile cues to correct. HHA gait to toilet minA with minA stand to sit. MaxA for toileting and lifting pants. Pt returned to w/c and left with all current needs met.      Tx2: Pt presented in bed, performed supine to sit with modA, hand over hand placement for use of bed rails. Ambulated 247f with HHA, cues for looking upright and staying to task. Stair training with HHA, ascend/decend x3 5in steps with modA using step to pattern two trials. Improved technique during second trial. Pt able to tolerate ambulation back to room in same manner and returned to bed with modA for sequencing. Pt in bed at end of session with husband present and all needs met.   Therapy Documentation Precautions:  Precautions Precautions: Fall Restrictions Weight Bearing Restrictions: No See Function Navigator for Current  Functional Status.   Therapy/Group: Individual Therapy  Kaiden Pech  Rafael Quesada, PTA  10/17/2016, 4:01 PM

## 2016-10-17 NOTE — Progress Notes (Signed)
[facility] PHYSICAL MEDICINE & REHABILITATION     PROGRESS NOTE  Subjective/Complaints:  Pt seen laying in bed this AM.  She is anxious.  She is upset because she was incontinent overnight and states she could not move at [place], pt reported to be more lethargic, baclofen d/ced, gabapentin decreased.  ROS:  Denies CP, SOB, N/V/D.  Objective: Vital Signs: Blood pressure 112/76, pulse 89, temperature 97.8 F (36.6 C), temperature source Oral, resp. rate 18, weight 64.6 kg (142 lb 6.4 oz), SpO2 95 %. No results found.  Recent Labs  10/15/16 0442  WBC 6.8  HGB 12.4  HCT 39.0  PLT 228    Recent Labs  10/15/16 0442  NA 136  K 3.9  CL 95*  GLUCOSE 100*  BUN 13  CREATININE 0.78  CALCIUM 8.8*   CBG (last 3)  No results for input(s): GLUCAP in the last 72 hours.  Wt Readings from Last 3 Encounters:  10/10/16 64.6 kg (142 lb 6.4 oz)  10/02/16 63.5 kg (140 lb)  09/20/16 65 kg (143 lb 3.2 oz)    Physical Exam:  BP 112/76 (BP Location: Left Arm)   Pulse 89   Temp 97.8 F (36.6 C) (Oral)   Resp 18   Wt 64.6 kg (142 lb 6.4 oz)   SpO2 95%   BMI 22.98 kg/m  Constitutional: She appears well-developed. Frail  HENT: Normocephalic and atraumatic.  Eyes: EOMI. No discharge.  Cardiovascular: RRR. No JVD.  Respiratory: Effort normal and breath sounds normal. GI: Soft. Bowel sounds are normal.  Musculoskeletal: She exhibits no edema or tenderness. Atrophy b/l hands. Neurological: She is alert and oriented.  Bilateral sensory loss distal to neck  Bilateral ataxia. Motor: B/l UE: 4-/5 proximal to distal (stable). B/l LE: 4/5 proximal to distal (stable) Skin: Skin is warm and dry.  Psychiatric: She has a normal mood and affect.   Assessment/Plan: 1. Functional deficits secondary to incomplete paraplegia which require 3+ hours per day of interdisciplinary therapy in a comprehensive inpatient rehab setting. Physiatrist is providing close team supervision and 24 hour  management of active medical problems listed below. Physiatrist and rehab team continue to assess barriers to discharge/monitor patient progress toward functional and medical goals.  Function:  Bathing Bathing position   Position: Shower  Bathing parts Body parts bathed by patient: Right arm, Left arm, Chest, Abdomen, Front perineal area, Right upper leg, Left upper leg, Right lower leg, Left lower leg, Buttocks Body parts bathed by helper: Back  Bathing assist Assist Level: Assistive device, Touching or steadying assistance(Pt > 75%) Assistive Device Comment: bath mitt    Upper Body Dressing/Undressing Upper body dressing   What is the patient wearing?: Pull over shirt/dress     Pull over shirt/dress - Perfomed by patient: Thread/unthread right sleeve, Thread/unthread left sleeve, Put head through opening Pull over shirt/dress - Perfomed by helper: Pull shirt over trunk        Upper body assist Assist Level: Touching or steadying assistance(Pt > 75%)      Lower Body Dressing/Undressing Lower body dressing   What is the patient wearing?: Pants Underwear - Performed by patient: Thread/unthread right underwear leg, Thread/unthread left underwear leg Underwear - Performed by helper: Pull underwear up/down Pants- Performed by patient: Thread/unthread right pants leg Pants- Performed by helper: Thread/unthread left pants leg, Pull pants up/down, Thread/unthread right pants leg   Non-skid slipper socks- Performed by helper: Don/doff right sock, Don/doff left sock  TED Hose - Performed by helper: Don/doff right TED hose, Don/doff left TED hose  Lower body assist Assist for lower body dressing:  (total A)      Toileting Toileting   Toileting steps completed by patient: Performs perineal hygiene Toileting steps completed by helper: Adjust clothing prior to toileting, Adjust clothing after toileting Toileting Assistive Devices: Grab bar or rail  Toileting assist  Assist level:  (max A)   Transfers Chair/bed transfer   Chair/bed transfer method: Stand pivot Chair/bed transfer assist level: Moderate assist (Pt 50 - 74%/lift or lower) Chair/bed transfer assistive device: Armrests     Locomotion Ambulation     Max distance: 171ft Assist level: Touching or steadying assistance (Pt > 75%)   Wheelchair   Type: Manual Max wheelchair distance: 150' Assist Level: Supervision or verbal cues (BLE)  Cognition Comprehension Comprehension assist level: Understands basic 75 - 89% of the time/ requires cueing 10 - 24% of the time  Expression Expression assist level: Expresses basic 90% of the time/requires cueing < 10% of the time.  Social Interaction Social Interaction assist level: Interacts appropriately 75 - 89% of the time - Needs redirection for appropriate language or to initiate interaction.  Problem Solving Problem solving assist level: Solves basic 75 - 89% of the time/requires cueing 10 - 24% of the time  Memory Memory assist level: Recognizes or recalls 75 - 89% of the time/requires cueing 10 - 24% of the time    Medical Problem List and Plan: 1.  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   Complete Zovirax as directed per infectious disease.    Prednisone tapered  Cont CIR 2.  DVT Prophylaxis/Anticoagulation: Monitor platelet counts and any signs of bleeding.    Vascular study showing acute LLE DVT, started Xarelto 3. Pain Management:   Neurontin 100 mg 3 times a day, increased to 300 on 2/13, decreased back to 100 TID due to lethargy  Valium 5 mg every 4 hours as needed muscle spasms, hydrocodone as needed 4. Mood: Xanax 1 mg daily as needed 5. Neuropsych: This patient is capable of making decisions on her own behalf. 6. Skin/Wound Care: Routine skin checks 7. Fluids/Electrolytes/Nutrition: Routine I&Os  BMP within acceptable range on 2/12 8. GERD. Protonix 9. Constipation. Laxative  assistance.  Increased bowel reg on 2/9  Improving 9. ABLA: Resolved  Hb 12.4 on 2/12  Cont to monitor 10. Neurogenic bladder  Monitor with I/O caths PRN  PVRs cont to fluctuate, appears to be more incontinent at night  Bladder scans   Will consider meds if consistently elevated after abx completed 11. Abd muscle spasms  Confounded by UTI  Baclofen 5 TID trail 2/11, increased to 10 on 2/12, d/ced per husband on 2/13 12. Acute lower UTI  UA+, Ucx Klebsiella Oxytoca  Macrobid 2/8-2/14   LOS (Days) 9 A FACE TO FACE EVALUATION WAS PERFORMED  Katelyn Lamb Katelyn Lamb 10/17/2016 10:44 AM

## 2016-10-18 ENCOUNTER — Inpatient Hospital Stay (HOSPITAL_COMMUNITY): Payer: PPO | Admitting: Occupational Therapy

## 2016-10-18 ENCOUNTER — Inpatient Hospital Stay (HOSPITAL_COMMUNITY): Payer: PPO

## 2016-10-18 MED ORDER — HYDROXYZINE HCL 10 MG PO TABS
10.0000 mg | ORAL_TABLET | Freq: Three times a day (TID) | ORAL | Status: DC | PRN
Start: 2016-10-18 — End: 2016-10-26
  Administered 2016-10-18: 10 mg via ORAL
  Filled 2016-10-18 (×3): qty 1

## 2016-10-18 MED ORDER — BETHANECHOL CHLORIDE 10 MG PO TABS
5.0000 mg | ORAL_TABLET | Freq: Three times a day (TID) | ORAL | Status: DC
  Administered 2016-10-18 – 2016-10-19 (×4): 5 mg via ORAL
  Filled 2016-10-18 (×4): qty 1

## 2016-10-18 MED ORDER — GABAPENTIN 100 MG PO CAPS
200.0000 mg | ORAL_CAPSULE | Freq: Three times a day (TID) | ORAL | Status: DC
  Administered 2016-10-18 – 2016-10-26 (×24): 200 mg via ORAL
  Filled 2016-10-18 (×25): qty 2

## 2016-10-18 NOTE — Progress Notes (Signed)
Physical Therapy Session Note  Patient Details  Name: Katelyn Lamb MRN: [id number] Date of Birth: [birth year]  Today's Date: 10/18/2016 PT Individual Time: 1345-1430 PT Individual Time Calculation (min): 45 min   Short Term Goals: Week 2:  PT Short Term Goal 1 (Week 2): Pt will be able to perform basic transfers with min assist PT Short Term Goal 2 (Week 2): Pt will be able to gait x 150' with min assist PT Short Term Goal 3 (Week 2): Pt will be able to perform 4 stairs with mod assist  Skilled Therapeutic Interventions/Progress Updates:    Session focused on functional bed mobility and transfer OOB to prepare for therapy session (min assist overall), gait down to day room x 150' with overall min assist without AD demonstrating improved balance and fluidity of movement, and neuro re-ed to address BUE coordination during fine motor tasks and dynamic standing balance/postural control re-training on Wii Fit. Pt required mod assist for sit <> stand from chair without armrests with facilitation for anterior weightshift. Pt with mod to max assist for dynamic standing balance on Wii Fit (mod assist to step up/down on platform) due to posterior lean and LOB. End of session hand off to OT for next session.  Therapy Documentation Precautions:  Precautions Precautions: Fall Restrictions Weight Bearing Restrictions: No  Pain: Premedicated for back pain.    See Function Navigator for Current Functional Status.   Therapy/Group: Individual Therapy  [name], PT, DPT  10/18/2016, 2:36 PM

## 2016-10-18 NOTE — Progress Notes (Signed)
Occupational Therapy Session Note  Patient Details  Name: Katelyn Lamb MRN: [id number] Date of Birth: 20-Mar-1946  Today's Date: 10/18/2016 OT Individual Time: 1430-1500 OT Individual Time Calculation (min): 30 min    Skilled Therapeutic Interventions/Progress Updates: 1:1 Therapetuic activities with focus on sit to stands with maintaining forward weight shift, dynamic standing balance with reaching outside of BOS, functional use of bilateral UE with functional grasp. Pt able to perform sit to stands without UE support with min to mod A with cues for anterior weight shift. Weights used on wrist during functional task for increased control. Pt benefited from visual attention to hands with functional tasks- however required mod cuing to attend to hands- difficulty with selective attention. (her motor movements require much of her attention). Performed toilet transfer with mod A and total A for toileting. Returned to bed with minA. Left in bed with RN and her husband.      Therapy Documentation Precautions:  Precautions Precautions: Fall Restrictions Weight Bearing Restrictions: No Pain: No c/o pain in session  See Function Navigator for Current Functional Status.   Therapy/Group: Individual Therapy  Willeen [facility] 10/18/2016, 8:24 PM

## 2016-10-18 NOTE — Progress Notes (Signed)
Occupational Therapy Session Note  Patient Details  Name: Katelyn Lamb MRN: [id number] Date of Birth: 1946/08/20  Today's Date: 10/18/2016 OT Individual Time: 0700-0800 OT Individual Time Calculation (min): 60 min    Short Term Goals: Week 2:  OT Short Term Goal 1 (Week 2): Pt will perform toileting with mod A balance while pt performs clothing management with increased time. OT Short Term Goal 2 (Week 2): Pt will perform LB dressing with mod A in order to decrease level of assist with self care.  OT Short Term Goal 3 (Week 2): Pt will perform grooming while standing at sink with min A for balance.   Skilled Therapeutic Interventions/Progress Updates:    Upon entering the room, pt supine in bed with 6/10 c/o lower back pain. RN notified and medication brought this session. Skilled OT intervention with focus on anterior weight shift, sit <>stand, functional transfers, and self feeding. Pt required manual facilitation and multimodal cues for anterior weight shift during each functional transfer and sit <>stand. Pt ambulating to bathroom with mod hand held assistance. Pt able to push pants down and perform toilet hygiene with encouragement and min - mod A standing balance depending on pt focus. Pt returning to sit on EOB with 1 lb hand weights donned, plate guard, and built up utensil and eating with R hand. Pt having difficulty slowing down movements and focusing on task this session due to excessive talking. Pt needing max cues for redirection. Pt continues to report increased pain in back and returning to supine at end of session.   Therapy Documentation Precautions:  Precautions Precautions: Fall Restrictions Weight Bearing Restrictions: No General:   Vital Signs: Therapy Vitals Temp: 98.6 F (37 C) Temp Source: Oral Pulse Rate: 94 Resp: 16 BP: 106/66 Patient Position (if appropriate): Lying Oxygen Therapy SpO2: 98 % O2 Device: Not Delivered Pain: Pain Assessment Pain  Assessment: 0-10 Pain Score: 5  Pain Type: Chronic pain Pain Location: Back Pain Orientation: Lower Pain Frequency: Intermittent Pain Onset: On-going Pain Intervention(s): Medication (See eMAR) ADL:   Exercises:   Other Treatments:    See Function Navigator for Current Functional Status.   Therapy/Group: Individual Therapy  [name] 10/18/2016, 3:50 PM

## 2016-10-18 NOTE — Progress Notes (Signed)
Social Work Patient ID: Katelyn Lamb, female   DOB: 28-Nov-1945, 71 y.o.   MRN: [id number]   Have reviewed team conference this week with pt and spouse.  Both aware that we continue to aim toward 2/23 d/c date and supervision goals.  Pt pleased with progress "but I've got a ways to go..."  Both felt session with neuropsychologist was very helpful yesterday and plan to meet with him again next week with family.  Will continue to follow.  Rusti Arizmendi, LCSW

## 2016-10-18 NOTE — Progress Notes (Addendum)
Physical Therapy Note  Patient Details  Name: AMILYN BOBLITT MRN: [id number] Date of Birth: [birth year] Today's Date: 10/18/2016  [id number], 60 min individual tx Pain: 5/10 low back at end of session; pt requested meds  Bed mobility without use of rails, in flat bed with mod assist.  Sitting static balance with VCs for trunk righting due to L lean.  Sit>< stand from EOB with multimodal cues for technique, min assist. Pt reported she would like to use toilet, but was unable to void.  Mod assist toilet transfer, max assist toileting. Footrests of w/c  adjusted to support and position pelvis for fit and function.  Gait without AD x 150' x 2 with L hand hold assist on level tile, mod assist.  RLE adducts and externally rotates = narrow BOS and bumping R foot against L at times.  Seated reciprocal scooting without use of UEs for pelvic actvation.  Blocked sit>< stand without use of UEs, focusing on trunk and cervical flexion. Pt has immediate LOB backwards upon standing with poor awareness.  Pt left resting in w/c with  all needs within reach; assisted for using call bell to request pain meds.  See function navigator for current status  Tamarick Kovalcik 10/18/2016, 7:52 AM

## 2016-10-18 NOTE — Progress Notes (Signed)
[facility]IC[facility]ILITATION     PROGRESS NOTE  Subjective/Complaints:  Pt seen laying in bed this AM.  She states she is doing well this AM.  She states she did not feel well yesterday AM, but had a better day.  She complain of abdominal dysesthesias.    ROS:  +Abdominal dysesthesia. Denies CP, SOB, N/V/D.  Objective: Vital Signs: Blood pressure 94/88, pulse 89, temperature 98.9 F (37.2 C), temperature source Oral, resp. rate 18, weight 64.6 kg (142 lb 6.4 oz), SpO2 96 %. No results found. No results for input(s): WBC, HGB, HCT, PLT in the last 72 hours. No results for input(s): NA, K, CL, GLUCOSE, BUN, CREATININE, CALCIUM in the last 72 hours.  Invalid input(s): CO CBG (last 3)  No results for input(s): GLUCAP in the last 72 hours.  Wt Readings from Last 3 Encounters:  10/10/16 64.6 kg (142 lb 6.4 oz)  10/02/16 63.5 kg (140 lb)  09/20/16 65 kg (143 lb 3.2 oz)    Physical Exam:  BP 94/88 (BP Location: Left Arm)   Pulse 89   Temp 98.9 F (37.2 C) (Oral)   Resp 18   Wt 64.6 kg (142 lb 6.4 oz)   SpO2 96%   BMI 22.98 kg/m  Constitutional: She appears well-developed. Frail  HENT: Normocephalic and atraumatic.  Eyes: EOMI. No discharge.  Cardiovascular: RRR. No JVD.  Respiratory: Effort normal and breath sounds normal. GI: Soft. Bowel sounds are normal.  Musculoskeletal: She exhibits no edema or tenderness. Atrophy b/l hands. Neurological: She is alert and oriented.  Sensation diminished to light touch distal to neck  Bilateral ataxia. Motor: B/l UE: 4-/5 proximal to distal (unchanged). B/l LE: 4/5 proximal to distal (stable) Skin: Skin is warm and dry.  Psychiatric: She has a normal mood and affect.   Assessment/Plan: 1. Functional deficits secondary to incomplete paraplegia which require 3+ hours per day of interdisciplinary therapy in a comprehensive inpatient rehab setting. Physiatrist is providing close team supervision and 24 hour management of  active medical problems listed below. Physiatrist and rehab team continue to assess barriers to discharge/monitor patient progress toward functional and medical goals.  Function:  Bathing Bathing position   Position: Shower  Bathing parts Body parts bathed by patient: Right arm, Left arm, Chest, Abdomen, Front perineal area, Right upper leg, Left upper leg, Right lower leg, Left lower leg, Buttocks Body parts bathed by helper: Back  Bathing assist Assist Level: Assistive device, Touching or steadying assistance(Pt > 75%) Assistive Device Comment: bath mitt    Upper Body Dressing/Undressing Upper body dressing   What is the patient wearing?: Pull over shirt/dress     Pull over shirt/dress - Perfomed by patient: Thread/unthread right sleeve, Thread/unthread left sleeve Pull over shirt/dress - Perfomed by helper: Pull shirt over trunk, Put head through opening        Upper body assist Assist Level:  (mod A)      Lower Body Dressing/Undressing Lower body dressing   What is the patient wearing?: Pants, Non-skid slipper socks, Ted Hose Underwear - Performed by patient: Thread/unthread right underwear leg, Thread/unthread left underwear leg Underwear - Performed by helper: Pull underwear up/down Pants- Performed by patient: Thread/unthread right pants leg Pants- Performed by helper: Thread/unthread left pants leg, Pull pants up/down, Fasten/unfasten pants   Non-skid slipper socks- Performed by helper: Don/doff right sock, Don/doff left sock               TED Hose - Performed by  helper: Don/doff right TED hose, Don/doff left TED hose  Lower body assist Assist for lower body dressing:  (total A)      Toileting Toileting   Toileting steps completed by patient: Performs perineal hygiene Toileting steps completed by helper: Adjust clothing prior to toileting, Adjust clothing after toileting Toileting Assistive Devices: Grab bar or rail  Toileting assist Assist level:  (max  assist)   Transfers Chair/bed transfer   Chair/bed transfer method: Stand pivot Chair/bed transfer assist level: Moderate assist (Pt 50 - 74%/lift or lower) Chair/bed transfer assistive device: Armrests     Locomotion Ambulation     Max distance: 134ft Assist level: Touching or steadying assistance (Pt > 75%)   Wheelchair   Type: Manual Max wheelchair distance: 150' Assist Level: Supervision or verbal cues (BLE)  Cognition Comprehension Comprehension assist level: Understands basic 75 - 89% of the time/ requires cueing 10 - 24% of the time  Expression Expression assist level: Expresses basic 90% of the time/requires cueing < 10% of the time.  Social Interaction Social Interaction assist level: Interacts appropriately 75 - 89% of the time - Needs redirection for appropriate language or to initiate interaction.  Problem Solving Problem solving assist level: Solves basic 75 - 89% of the time/requires cueing 10 - 24% of the time  Memory Memory assist level: Recognizes or recalls 75 - 89% of the time/requires cueing 10 - 24% of the time    Medical Problem List and Plan: 1.  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   Completed Zovirax as directed per infectious disease. Spoke to ID per insistence of husband regarding repeat MRI - no indication at this time.   Prednisone tapered  Cont CIR 2.  DVT Prophylaxis/Anticoagulation: Monitor platelet counts and any signs of bleeding.    Vascular study showing acute LLE DVT, started Xarelto 3. Pain Management:   Neurontin 100 mg 3 times a day, will increase to 200 TID.  Spoke with pt and discussed concerns, including husband's concern of lethargy.  She states she would like to increase th dose as she believes this works well for her.   Valium 5 mg every 4 hours as needed muscle spasms, hydrocodone as needed 4. Mood: Xanax 1 mg daily as needed 5. Neuropsych: This patient is capable of making  decisions on her own behalf. 6. Skin/Wound Care: Routine skin checks 7. Fluids/Electrolytes/Nutrition: Routine I&Os  BMP within acceptable range on 2/12 8. GERD. Protonix 9. Constipation. Laxative assistance.  Increased bowel reg on 2/9  Improving 9. ABLA: Resolved  Hb 12.4 on 2/12  Cont to monitor 10. Neurogenic bladder  Monitor with I/O caths PRN  PVRs cont to fluctuate, appears to be more incontinent at night with retention during the day  Trial of bethanechol 5 TID  11. Abd muscle spasms  Confounded by UTI  Baclofen 5 TID trail 2/11, increased to 10 on 2/12, d/ced per husband on 2/13 12. Acute lower UTI  UA+, Ucx Klebsiella Oxytoca  Macrobid completed 2/8-2/14   LOS (Days) 10 A FACE TO FACE EVALUATION WAS PERFORMED  Ankit Lorie Phenix 10/18/2016 7:47 AM

## 2016-10-19 ENCOUNTER — Inpatient Hospital Stay (HOSPITAL_COMMUNITY): Payer: PPO | Admitting: Physical Therapy

## 2016-10-19 ENCOUNTER — Inpatient Hospital Stay (HOSPITAL_COMMUNITY): Payer: PPO | Admitting: Occupational Therapy

## 2016-10-19 ENCOUNTER — Inpatient Hospital Stay (HOSPITAL_COMMUNITY): Payer: PPO

## 2016-10-19 DIAGNOSIS — R270 Ataxia, unspecified: Secondary | ICD-10-CM

## 2016-10-19 DIAGNOSIS — F418 Other specified anxiety disorders: Secondary | ICD-10-CM

## 2016-10-19 DIAGNOSIS — R4589 Other symptoms and signs involving emotional state: Secondary | ICD-10-CM

## 2016-10-19 DIAGNOSIS — R339 Retention of urine, unspecified: Secondary | ICD-10-CM

## 2016-10-19 DIAGNOSIS — F329 Major depressive disorder, single episode, unspecified: Secondary | ICD-10-CM

## 2016-10-19 MED ORDER — FLUOXETINE HCL 10 MG PO CAPS
10.0000 mg | ORAL_CAPSULE | Freq: Every day | ORAL | Status: DC
  Administered 2016-10-19: 10 mg via ORAL
  Filled 2016-10-19 (×5): qty 1

## 2016-10-19 MED ORDER — PROPRANOLOL HCL 10 MG PO TABS
5.0000 mg | ORAL_TABLET | Freq: Three times a day (TID) | ORAL | Status: DC
  Administered 2016-10-19 – 2016-10-25 (×13): 5 mg via ORAL
  Filled 2016-10-19 (×22): qty 1

## 2016-10-19 MED ORDER — BETHANECHOL CHLORIDE 10 MG PO TABS
10.0000 mg | ORAL_TABLET | Freq: Three times a day (TID) | ORAL | Status: DC
  Administered 2016-10-19 – 2016-10-22 (×8): 10 mg via ORAL
  Filled 2016-10-19 (×9): qty 1

## 2016-10-19 NOTE — Progress Notes (Signed)
Occupational Therapy Session Note  Patient Details  Name: Katelyn Lamb MRN: [id number] Date of Birth: 03-04-1946  Today's Date: 10/19/2016 OT Individual Time: [id number] OT Individual Time Calculation (min): 67 min    Short Term Goals: Week 2:  OT Short Term Goal 1 (Week 2): Pt will perform toileting with mod A balance while pt performs clothing management with increased time. OT Short Term Goal 2 (Week 2): Pt will perform LB dressing with mod A in order to decrease level of assist with self care.  OT Short Term Goal 3 (Week 2): Pt will perform grooming while standing at sink with min A for balance.   Skilled Therapeutic Interventions/Progress Updates: Patient seated on toilet by nurse tech upon approach for OT.   She participated as follows:  Toileting=Max A for clothing mgmt (unsteady dynamic balance during)  Toilet transfer (HHA to walk toilet to w/c at sink)= Min A  Fine Motor and UE coordination and motor control work  Mat mobility and core strength with Max A due to decreased uppper body strength and whole body taxia and incoordination  Static and dynamic standing activities with focus on forward shifting (Max A) - patient with posterior lean   Patient was left sitting edge of bed and her husband setting up her dinner tray and her son sitting by visiting     Therapy Documentation Precautions:  Precautions Precautions: Fall Restrictions Weight Bearing Restrictions: No  Pain:denied   See Function Navigator for Current Functional Status.   Therapy/Group: Individual Therapy  Alfredia Ferguson [facility] 10/19/2016, 6:50 PM

## 2016-10-19 NOTE — Progress Notes (Signed)
Occupational Therapy Session Note  Patient Details  Name: Katelyn Lamb MRN: [id number] Date of Birth: 05-15-1946  Today's Date: 10/19/2016 OT Individual Time: [id number] OT Individual Time Calculation (min): 67 min    Short Term Goals: Week 2:  OT Short Term Goal 1 (Week 2): Pt will perform toileting with mod A balance while pt performs clothing management with increased time. OT Short Term Goal 2 (Week 2): Pt will perform LB dressing with mod A in order to decrease level of assist with self care.  OT Short Term Goal 3 (Week 2): Pt will perform grooming while standing at sink with min A for balance.   Skilled Therapeutic Interventions/Progress Updates: Pt was lying in bed at time of arrival, agreeable to session. Supine<sit completed with supervision and HOB flat. Pt ate breakfast with use of plate guard, built up utensils, weighted cup, and 1# wrist weights. Pt able to provide caregiver directed setup of AE for carryover at home. Pt reported having pain and RN notified, providing medication during session. Afterwards pt reported needing to void, ambulated to bathroom with Mod A HHA and cues for anterior weight shift during sit<stand. Pt provided extra time to lower pants (with weighted cuffs) but still required Max A for clothing mgt. Hand hygiene completed in standing at sink with steady assist. Afterwards pt ambulated to dresser to gather ADL items, initiated dressing w/c level at sink with weighted cuffs. Pt exhibiting frustration/sadness throughout session with report of feeling discouraged due to her physical progress. Pt provided support, encouragement, and extra time to complete self care tasks today. At end of session pt was left with PT to finish LB dressing.      Therapy Documentation Precautions:  Precautions Precautions: Fall Restrictions Weight Bearing Restrictions: No   Pain: C/o back pain, RN notified to provide medication Pain Assessment Pain Assessment: 0-10 Pain Score: 7   Pain Type: Chronic pain Pain Location: Back Pain Orientation: Lower Pain Intervention(s): Medication (See eMAR) ADL:      See Function Navigator for Current Functional Status.   Therapy/Group: Individual Therapy  Bush Murdoch A Yliana Gravois 10/19/2016, 12:17 PM

## 2016-10-19 NOTE — Progress Notes (Signed)
Physical Therapy Session Note  Patient Details  Name: Katelyn Lamb MRN: [id number] Date of Birth: 1945/11/14  Today's Date: 10/19/2016 PT Individual Time: 1030-1100 PT Individual Time Calculation (min): 30 min   Short Term Goals: Week 2:  PT Short Term Goal 1 (Week 2): Pt will be able to perform basic transfers with min assist PT Short Term Goal 2 (Week 2): Pt will be able to gait x 150' with min assist PT Short Term Goal 3 (Week 2): Pt will be able to perform 4 stairs with mod assist  Skilled Therapeutic Interventions/Progress Updates: Pt received supine in bed, c/o pain as below and requesting pain medication at beginning of session. Supine>sit with S, bedrails and HOB elevated; increased time d/t truncal and extremity ataxia. Seated on EOB, pt dons B shoes with setupA; unable to tie shoes. Provided pt with elastic shoe laces to reduce energy expenditure and improve independence. Seated on EOB with distant S for sitting balance, pt engaged in task at table top for BUE coordination. Pt turning small animal figures right-side up with alternating UEs; significantly increased time and required assist from therapist to pick up toy animals from the floor after dropping them. Remained seated on EOB at end of session with pt using call bell to request assist to use restroom; bed alarm intact and all needs in reach.      Therapy Documentation Precautions:  Precautions Precautions: Fall Restrictions Weight Bearing Restrictions: No Pain: Pain Assessment Pain Assessment: 0-10 Pain Score: 7  Pain Type: Chronic pain Pain Location: Back Pain Orientation: Lower Pain Intervention(s): Medication (See eMAR)   See Function Navigator for Current Functional Status.   Therapy/Group: Individual Therapy  Luberta Mutter 10/19/2016, 12:04 PM

## 2016-10-19 NOTE — Progress Notes (Signed)
Physical Therapy Session Note  Patient Details  Name: Katelyn Lamb MRN: [id number] Date of Birth: [birth year]  Today's Date: 10/19/2016 PT Individual Time: 0900-1000 PT Individual Time Calculation (min): 60 min   Short Term Goals: Week 2:  PT Short Term Goal 1 (Week 2): Pt will be able to perform basic transfers with min assist PT Short Term Goal 2 (Week 2): Pt will be able to gait x 150' with min assist PT Short Term Goal 3 (Week 2): Pt will be able to perform 4 stairs with mod assist  Skilled Therapeutic Interventions/Progress Updates:   Handoff from OT at sink up in w/c working on lower body dressing. Pt unable to complete pants and increasingly frustrated. Pt reporting have a bad night and not a good morning so far. Emotional support provided throughout session due to pt expressing more frustration today. Fine motor tasks to finish oral hygiene at sink using 1# weights on Bilateral arms to address ataxia.  Neuro re-ed addressed throughout session with focus on coordination, motor control, balance, and postural control. Pt required mod assist for transfers and min to mod assist for gait with L HHA; diffculty with turns. Reciprocal movement pattern re-training and functional strengthening and coordination addressed during w/c propulsion with BLE and seated on Kinetron (30 cm/sec resistance). Blocked practice sit <> stand retraining with focus on anterior weightshift and maintaining upright postural control (pt with posterior lean tendency). End of session returned to bed to rest per patient request with all needs in reach.   Therapy Documentation Precautions:  Precautions Precautions: Fall Restrictions Weight Bearing Restrictions: No   Pain:  c/o back pain - RN notified for pain medication.   See Function Navigator for Current Functional Status.   Therapy/Group: Individual Therapy  [name], PT, DPT  10/19/2016, 10:57 AM

## 2016-10-19 NOTE — Progress Notes (Signed)
[place] PH[facility]ICAL MEDICINE & REHABILITATION     PROGRESS NOTE  Subjective/Complaints:  Pt seen sitting up in bed this AM.  She is flat initially.  She states she slept well ovenight, but work up feeling "bad" this AM, but not feels better.    ROS:  +Abdominal dysesthesia. Denies CP, SOB, N/V/D.  Objective: Vital Signs: Blood pressure 90/64, pulse 87, temperature 98.7 F (37.1 C), temperature source Oral, resp. rate 18, weight 64.6 kg (142 lb 6.4 oz), SpO2 95 %. No results found. No results for input(s): WBC, HGB, HCT, PLT in the last 72 hours. No results for input(s): NA, K, CL, GLUCOSE, BUN, CREATININE, CALCIUM in the last 72 hours.  Invalid input(s): CO CBG (last 3)  No results for input(s): GLUCAP in the last 72 hours.  Wt Readings from Last 3 Encounters:  10/10/16 64.6 kg (142 lb 6.4 oz)  10/02/16 63.5 kg (140 lb)  09/20/16 65 kg (143 lb 3.2 oz)    Physical Exam:  BP 90/64 (BP Location: Left Arm)   Pulse 87   Temp 98.7 F (37.1 C) (Oral)   Resp 18   Wt 64.6 kg (142 lb 6.4 oz)   SpO2 95%   BMI 22.98 kg/m  Constitutional: She appears well-developed. Frail  HENT: Normocephalic and atraumatic.  Eyes: EOMI. No discharge.  Cardiovascular: RRR. No JVD.  Respiratory: Effort normal and breath sounds normal. GI: Soft. Bowel sounds are normal.  Musculoskeletal: She exhibits no edema or tenderness. Atrophy b/l hands. Neurological: She is alert and oriented.  Sensation diminished to light touch distal to neck  Bilateral ataxia. Motor: B/l UE: 4-/5 proximal to distal (stable). B/l LE: 4/5 proximal to distal (stable) Skin: Skin is warm and dry.  Psychiatric: She has a normal mood and affect.   Assessment/Plan: 1. Functional deficits secondary to incomplete paraplegia which require 3+ hours per day of interdisciplinary therapy in a comprehensive inpatient rehab setting. Physiatrist is providing close team supervision and 24 hour management of active medical problems listed  below. Physiatrist and rehab team continue to assess barriers to discharge/monitor patient progress toward functional and medical goals.  Function:  Bathing Bathing position   Position: Shower  Bathing parts Body parts bathed by patient: Right arm, Left arm, Chest, Abdomen, Front perineal area, Right upper leg, Left upper leg, Right lower leg, Left lower leg, Buttocks Body parts bathed by helper: Back  Bathing assist Assist Level: Assistive device, Touching or steadying assistance(Pt > 75%) Assistive Device Comment: bath mitt    Upper Body Dressing/Undressing Upper body dressing   What is the patient wearing?: Pull over shirt/dress     Pull over shirt/dress - Perfomed by patient: Thread/unthread right sleeve, Thread/unthread left sleeve Pull over shirt/dress - Perfomed by helper: Pull shirt over trunk, Put head through opening        Upper body assist Assist Level:  (mod A)      Lower Body Dressing/Undressing Lower body dressing   What is the patient wearing?: Pants, Non-skid slipper socks, Ted Hose Underwear - Performed by patient: Thread/unthread right underwear leg, Thread/unthread left underwear leg Underwear - Performed by helper: Pull underwear up/down Pants- Performed by patient: Thread/unthread right pants leg Pants- Performed by helper: Thread/unthread left pants leg, Pull pants up/down, Fasten/unfasten pants   Non-skid slipper socks- Performed by helper: Don/doff right sock, Don/doff left sock               TED Hose - Performed by helper: Don/doff right TED hose, Don/doff  left TED hose  Lower body assist Assist for lower body dressing:  (total A)      Toileting Toileting   Toileting steps completed by patient: Performs perineal hygiene, Adjust clothing prior to toileting Toileting steps completed by helper: Adjust clothing after toileting Toileting Assistive Devices: Grab bar or rail  Toileting assist Assist level:  (mod A)   Transfers Chair/bed  transfer   Chair/bed transfer method: Stand pivot Chair/bed transfer assist level: Moderate assist (Pt 50 - 74%/lift or lower) Chair/bed transfer assistive device: Armrests     Locomotion Ambulation     Max distance: 150' Assist level: Touching or steadying assistance (Pt > 75%)   Wheelchair   Type: Manual Max wheelchair distance: 150' Assist Level: Supervision or verbal cues (BLE)  Cognition Comprehension Comprehension assist level: Understands basic 75 - 89% of the time/ requires cueing 10 - 24% of the time  Expression Expression assist level: Expresses basic 90% of the time/requires cueing < 10% of the time.  Social Interaction Social Interaction assist level: Interacts appropriately 75 - 89% of the time - Needs redirection for appropriate language or to initiate interaction.  Problem Solving Problem solving assist level: Solves basic 75 - 89% of the time/requires cueing 10 - 24% of the time  Memory Memory assist level: Recognizes or recalls 75 - 89% of the time/requires cueing 10 - 24% of the time    Medical Problem List and Plan: 1.  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   Completed Zovirax as directed per infectious disease. Spoke to ID per insistence of husband regarding repeat MRI - no indication at this time.   Prednisone d/ced  Cont CIR  Trial propranolol  2.  DVT Prophylaxis/Anticoagulation: Monitor platelet counts and any signs of bleeding.    Vascular study showing acute LLE DVT, started Xarelto 3. Pain Management:   Neurontin 100 mg 3 times a day, will increase to 200 TID.  Spoke with pt and discussed concerns, including husband's concern of lethargy.  She states she would like to increase th dose as she believes this works well for her.   Valium 5 mg every 4 hours as needed muscle spasms, hydrocodone as needed  Prozaac started 2/16 4. Mood: Xanax 1 mg daily as needed  Trial propranolol 5 TID 5. Neuropsych: This  patient is capable of making decisions on her own behalf. 6. Skin/Wound Care: Routine skin checks 7. Fluids/Electrolytes/Nutrition: Routine I&Os  BMP within acceptable range on 2/12  Labs ordered for Monday 8. GERD. Protonix 9. Constipation. Laxative assistance.  Increased bowel reg on 2/9  Improving 9. ABLA: Resolved  Hb 12.4 on 2/12  Labs ordered for Monday  Cont to monitor 10. Neurogenic bladder  Monitor with I/O caths PRN  PVRs cont to fluctuate, appears to be more incontinent at night with retention during the day  Trial of bethanechol 5 TID, increased to 10 on 2/16  11. Abd muscle spasms  Confounded by UTI  Baclofen 5 TID trail 2/11, increased to 10 on 2/12, d/ced per husband on 2/13 12. Acute lower UTI  UA+, Ucx Klebsiella Oxytoca  Macrobid completed 2/8-2/14   LOS (Days) 11 A FACE TO FACE EVALUATION WAS PERFORMED  Katelyn Lamb Lorie Phenix 10/19/2016 9:01 AM

## 2016-10-20 ENCOUNTER — Inpatient Hospital Stay (HOSPITAL_COMMUNITY): Payer: PPO

## 2016-10-20 NOTE — Progress Notes (Signed)
[place] PHYSICAL MEDICINE & REHABILITATION     PROGRESS NOTE  Subjective/Complaints:  Reports reasonable sleep. Doesn't initiate much  ROS: Limited due cognitive/behavioral   Objective: Vital Signs: Blood pressure (!) 89/60, pulse 79, temperature 98.7 F (37.1 C), temperature source Oral, resp. rate 18, weight 64.6 kg (142 lb 6.4 oz), SpO2 97 %. No results found. No results for input(s): WBC, HGB, HCT, PLT in the last 72 hours. No results for input(s): NA, K, CL, GLUCOSE, BUN, CREATININE, CALCIUM in the last 72 hours.  Invalid input(s): CO CBG (last 3)  No results for input(s): GLUCAP in the last 72 hours.  Wt Readings from Last 3 Encounters:  10/10/16 64.6 kg (142 lb 6.4 oz)  10/02/16 63.5 kg (140 lb)  09/20/16 65 kg (143 lb 3.2 oz)    Physical Exam:  BP (!) 89/60   Pulse 79   Temp 98.7 F (37.1 C) (Oral)   Resp 18   Wt 64.6 kg (142 lb 6.4 oz)   SpO2 97%   BMI 22.98 kg/m  Constitutional: She appears well-developed. Frail  HENT: Normocephalic and atraumatic.  Eyes: EOMI. No discharge.  Cardiovascular: RRR.  Respiratory: Effort normal and breath sounds normal. GI: Soft. Bowel sounds are normal.  Musculoskeletal: She exhibits no edema or tenderness. Atrophy b/l hands. Neurological: She is alert and oriented.  Sensation diminished to light touch distal to neck  Bilateral ataxia. Motor: B/l UE: 4-/5 proximal to distal (stable). B/l LE: 4/5 proximal to distal (stable) Skin: Skin is warm and dry.  Psychiatric: She has a normal mood and affect.   Assessment/Plan: 1. Functional deficits secondary to incomplete paraplegia which require 3+ hours per day of interdisciplinary therapy in a comprehensive inpatient rehab setting. Physiatrist is providing close team supervision and 24 hour management of active medical problems listed below. Physiatrist and rehab team continue to assess barriers to discharge/monitor patient progress toward functional and medical  goals.  Function:  Bathing Bathing position   Position: Shower  Bathing parts Body parts bathed by patient: Right arm, Left arm, Chest, Abdomen, Front perineal area, Right upper leg, Left upper leg, Right lower leg, Left lower leg, Buttocks Body parts bathed by helper: Back  Bathing assist Assist Level: Assistive device, Touching or steadying assistance(Pt > 75%) Assistive Device Comment: bath mitt    Upper Body Dressing/Undressing Upper body dressing   What is the patient wearing?: Pull over shirt/dress     Pull over shirt/dress - Perfomed by patient: Put head through opening, Pull shirt over trunk Pull over shirt/dress - Perfomed by helper: Thread/unthread left sleeve, Thread/unthread right sleeve        Upper body assist Assist Level:  (Mod A)      Lower Body Dressing/Undressing Lower body dressing   What is the patient wearing?: Pants, Shoes Underwear - Performed by patient: Thread/unthread right underwear leg, Thread/unthread left underwear leg Underwear - Performed by helper: Pull underwear up/down Pants- Performed by patient: Thread/unthread right pants leg Pants- Performed by helper: Thread/unthread left pants leg, Pull pants up/down   Non-skid slipper socks- Performed by helper: Don/doff right sock, Don/doff left sock       Shoes - Performed by helper: Don/doff right shoe, Don/doff left shoe, Fasten right, Fasten left       TED Hose - Performed by helper: Don/doff right TED hose, Don/doff left TED hose  Lower body assist Assist for lower body dressing: Touching or steadying assistance (Pt > 75%)      Toileting Toileting  Toileting steps completed by patient: Performs perineal hygiene, Adjust clothing prior to toileting Toileting steps completed by helper: Adjust clothing prior to toileting, Performs perineal hygiene, Adjust clothing after toileting Toileting Assistive Devices: Grab bar or rail  Toileting assist Assist level:  (mod A)    Transfers Chair/bed transfer   Chair/bed transfer method: Ambulatory, Stand pivot Chair/bed transfer assist level: Moderate assist (Pt 50 - 74%/lift or lower) Chair/bed transfer assistive device: Armrests     Locomotion Ambulation     Max distance: 120' Assist level: Touching or steadying assistance (Pt > 75%)   Wheelchair   Type: Manual Max wheelchair distance: 120' (BLE) Assist Level: Touching or steadying assistance (Pt > 75%)  Cognition Comprehension Comprehension assist level: Understands basic 75 - 89% of the time/ requires cueing 10 - 24% of the time  Expression Expression assist level: Expresses basic 90% of the time/requires cueing < 10% of the time.  Social Interaction Social Interaction assist level: Interacts appropriately 75 - 89% of the time - Needs redirection for appropriate language or to initiate interaction.  Problem Solving Problem solving assist level: Solves basic 75 - 89% of the time/requires cueing 10 - 24% of the time  Memory Memory assist level: Recognizes or recalls 75 - 89% of the time/requires cueing 10 - 24% of the time    Medical Problem List and Plan: 1.  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   Completed Zovirax as directed per infectious disease. Spoke to ID per insistence of husband regarding repeat MRI - no indication at this time.   Prednisone d/ced  Cont CIR  Trial propranolol  2.  DVT Prophylaxis/Anticoagulation: Monitor platelet counts and any signs of bleeding.    Vascular study showing acute LLE DVT, started Xarelto 3. Pain Management:   Neurontin 100 mg 3 times a day, will increase to 200 TID.  Spoke with pt and discussed concerns, including husband's concern of lethargy.  She states she would like to increase the dose as she believes this works well for her.   Valium 5 mg every 4 hours as needed muscle spasms, hydrocodone as needed  Prozaac started 2/16 4. Mood: Xanax 1 mg daily as  needed  Trial propranolol 5 TID 5. Neuropsych: This patient is capable of making decisions on her own behalf. 6. Skin/Wound Care: Routine skin checks 7. Fluids/Electrolytes/Nutrition: Routine I&Os  BMP within acceptable range on 2/12  Labs ordered for Monday 8. GERD. Protonix 9. Constipation. Laxative assistance.  Increased bowel reg on 2/9  Improving 9. ABLA: Resolved  Hb 12.4 on 2/12  Labs ordered for Monday  Cont to monitor 10. Neurogenic bladder  Monitor with I/O caths PRN  PVRs cont to fluctuate, appears to be more incontinent at night with retention during the day  Trial of bethanechol 5 TID, increased to 10 on 2/16  11. Abd muscle spasms  Confounded by UTI  Baclofen 5 TID trail 2/11, increased to 10 on 2/12, d/ced per husband on 2/13 12. Acute lower UTI  UA+, Ucx Klebsiella Oxytoca  Macrobid completed 2/8-2/14   LOS (Days) 12 A FACE TO FACE EVALUATION WAS PERFORMED  SWARTZ,ZACHARY T 10/20/2016 9:45 AM

## 2016-10-20 NOTE — Progress Notes (Signed)
Patient c/o increased numbness "all over."  States "it is traveling up my chest."  Refused prozac this morning.  Dr. Naaman Plummer notified of patient symptoms.  Continues to be alert and oriented x4, no SOB or c/o acute pain noted.  Able to feel being touched on all extremities.  No changes noted in strength or ability to move extremities.

## 2016-10-20 NOTE — Progress Notes (Signed)
Asked by RN to see patient as she was complaining of increased tingling in her legs. I examined the patient and her exam appeared consistent with this morning as well as exams documented during the week. She is able to sense light touch and pain in both legs. Motor exam grossly 4/5 in both limbs. She does appear anxious and depressed. RN reported that she refused prozac this morning.   I reassured the patient that I saw no acute changes. Encouraged her to participate with therapy and provided further positive reinforcement.   [name], MD, [facility] 10/20/2016 '

## 2016-10-20 NOTE — Progress Notes (Signed)
Pt has total of 400 per bladder scan present in her bladder. Pt is unable to void on her on. Pt was attempted to be straight cathed X 3 attempts. The third attempt was successful with a return of 350 cc of clear straw colored urine without any presence of foul odor. Pt tolerated well. Past the  procedure pt complained of burning in her urethral area. Was instructed to let nurse know if the burning did not stop.pt verbalized understanding.

## 2016-10-20 NOTE — Progress Notes (Signed)
Occupational Therapy Session Note  Patient Details  Name: Katelyn Lamb MRN: [id number] Date of Birth: 1945/09/12  Today's Date: 10/20/2016 OT Individual Time: 1400-1500 OT Individual Time Calculation (min): 60 min   Short Term Goals: Week 2:  OT Short Term Goal 1 (Week 2): Pt will perform toileting with mod A balance while pt performs clothing management with increased time. OT Short Term Goal 2 (Week 2): Pt will perform LB dressing with mod A in order to decrease level of assist with self care.  OT Short Term Goal 3 (Week 2): Pt will perform grooming while standing at sink with min A for balance.   Skilled Therapeutic Interventions/Progress Updates: ADL-retraining with focus on toileting, transfers 30 min); therapeutic activity with focus on BUE coordination/FMC. Pt received seated on toilet with daughter Katelyn Lamb present and assisting.   Pt required extra time to eliminate and total assist for hygiene due to poor RUE coordination/sensory impairment.   Pt required hand-guidance to return to w/c and was escorted to rehab gym to completed BUE coordination task with daughter observing. OT provided HEP for [facility] and family education on post-discharge recommendations to improve coordination.    Pt completed pipe-tree assembly with intermittent min assist to manage items 5 times during session.   Pt sustained attention to task and reported improved coordination with use of 1/2 lb wrist weight provided during session.    Therapy Documentation Precautions:  Precautions Precautions: Fall Restrictions Weight Bearing Restrictions: No   Vital Signs: Therapy Vitals Temp: 98.9 F (37.2 C) Temp Source: Oral Pulse Rate: 68 Resp: 16 BP: 95/64 Patient Position (if appropriate): Sitting Oxygen Therapy SpO2: 98 % O2 Device: Not Delivered   Pain: Pt reported some hypersensitivity to physical contact (touch, pressure) from daughter and OT during session.  See Function Navigator for Current Functional  Status.   Therapy/Group: Individual Therapy  [place] 10/20/2016, 4:09 PM

## 2016-10-21 ENCOUNTER — Inpatient Hospital Stay (HOSPITAL_COMMUNITY): Payer: PPO | Admitting: Physical Therapy

## 2016-10-21 NOTE — Progress Notes (Signed)
Physical Therapy Session Note  Patient Details  Name: Katelyn Lamb MRN: [id number] Date of Birth: [birth year]  Today's Date: 10/21/2016 PT Individual Time: [id number] PT Individual Time Calculation (min): 48 min   Short Term Goals: Week 2:  PT Short Term Goal 1 (Week 2): Pt will be able to perform basic transfers with min assist PT Short Term Goal 2 (Week 2): Pt will be able to gait x 150' with min assist PT Short Term Goal 3 (Week 2): Pt will be able to perform 4 stairs with mod assist  Skilled Therapeutic Interventions/Progress Updates:    Pt received in bed & agreeable to tx. Pt's family members present but exiting upon PT arrival. Pt noted 8/10 mid-back pain & RN made aware; meds administered during session. Session focused on transfers, functional mobility, and BUE NMR. Pt completed stand pivot and sit<>stand transfers with steady/min assist and min cuing for sequencing stand pivot. Pt ambulated 200 ft without AD & steady assist with slight ataxic gait. Pt engaged in connect four game with 1 pound weights on BUE for increased feedback with task focusing on coordination and fine motor use of BUE with pt requiring significantly extra time to complete task. At end of session pt left sitting on EOB set up with meal tray and with nursing student present.   Therapy Documentation Precautions:  Precautions Precautions: Fall Restrictions Weight Bearing Restrictions: No   See Function Navigator for Current Functional Status.   Therapy/Group: Individual Therapy  Waunita Schooner 10/21/2016, 5:21 PM

## 2016-10-21 NOTE — Progress Notes (Signed)
[place] PHYSICAL MEDICINE & REHABILITATION     PROGRESS NOTE  Subjective/Complaints:  In much better spirits today. Tingling/numbness subsided yesterday. Had a good day with therapy  ROS: pt denies nausea, vomiting, diarrhea, cough, shortness of breath or chest pain   Objective: Vital Signs: Blood pressure (!) 88/54, pulse 92, temperature 97.9 F (36.6 C), temperature source Oral, resp. rate 16, weight 64.6 kg (142 lb 6.4 oz), SpO2 95 %. No results found. No results for input(s): WBC, HGB, HCT, PLT in the last 72 hours. No results for input(s): NA, K, CL, GLUCOSE, BUN, CREATININE, CALCIUM in the last 72 hours.  Invalid input(s): CO CBG (last 3)  No results for input(s): GLUCAP in the last 72 hours.  Wt Readings from Last 3 Encounters:  10/10/16 64.6 kg (142 lb 6.4 oz)  10/02/16 63.5 kg (140 lb)  09/20/16 65 kg (143 lb 3.2 oz)    Physical Exam:  BP (!) 88/54 (BP Location: Right Arm)   Pulse 92   Temp 97.9 F (36.6 C) (Oral)   Resp 16   Wt 64.6 kg (142 lb 6.4 oz)   SpO2 95%   BMI 22.98 kg/m  Constitutional: She appears well-developed. Frail  HENT: Normocephalic and atraumatic.  Eyes: EOMI. No discharge.  Cardiovascular: RRR.  Respiratory: normal effort. GI: Soft. Bowel sounds are normal.  Musculoskeletal: She exhibits no edema or tenderness. Atrophy b/l hands. Neurological: She is alert and oriented.  Sensation diminished to light touch distal to neck  Bilateral ataxia. Motor: B/l UE: 4-/5 proximal to distal (no change). B/l LE: 4/5 proximal to distal (stable) Skin: Skin is warm and dry.  Psychiatric: She has a normal mood and affect. Bright, less anxious  Assessment/Plan: 1. Functional deficits secondary to incomplete paraplegia which require 3+ hours per day of interdisciplinary therapy in a comprehensive inpatient rehab setting. Physiatrist is providing close team supervision and 24 hour management of active medical problems listed below. Physiatrist and  rehab team continue to assess barriers to discharge/monitor patient progress toward functional and medical goals.  Function:  Bathing Bathing position   Position: Shower  Bathing parts Body parts bathed by patient: Right arm, Left arm, Chest, Abdomen, Front perineal area, Right upper leg, Left upper leg, Right lower leg, Left lower leg, Buttocks Body parts bathed by helper: Back  Bathing assist Assist Level: Assistive device, Touching or steadying assistance(Pt > 75%) Assistive Device Comment: bath mitt    Upper Body Dressing/Undressing Upper body dressing   What is the patient wearing?: Pull over shirt/dress     Pull over shirt/dress - Perfomed by patient: Put head through opening, Pull shirt over trunk Pull over shirt/dress - Perfomed by helper: Thread/unthread left sleeve, Thread/unthread right sleeve        Upper body assist Assist Level:  (Mod A)      Lower Body Dressing/Undressing Lower body dressing   What is the patient wearing?: Pants, Shoes Underwear - Performed by patient: Thread/unthread right underwear leg, Thread/unthread left underwear leg Underwear - Performed by helper: Pull underwear up/down Pants- Performed by patient: Thread/unthread right pants leg Pants- Performed by helper: Thread/unthread left pants leg, Pull pants up/down   Non-skid slipper socks- Performed by helper: Don/doff right sock, Don/doff left sock       Shoes - Performed by helper: Don/doff right shoe, Don/doff left shoe, Fasten right, Fasten left       TED Hose - Performed by helper: Don/doff right TED hose, Don/doff left TED hose  Lower body assist  Assist for lower body dressing: Touching or steadying assistance (Pt > 75%)      Toileting Toileting   Toileting steps completed by patient: Performs perineal hygiene, Adjust clothing prior to toileting Toileting steps completed by helper: Adjust clothing prior to toileting, Performs perineal hygiene, Adjust clothing after  toileting Toileting Assistive Devices: Grab bar or rail  Toileting assist Assist level:  (mod A)   Transfers Chair/bed transfer   Chair/bed transfer method: Ambulatory, Stand pivot Chair/bed transfer assist level: Moderate assist (Pt 50 - 74%/lift or lower) Chair/bed transfer assistive device: Armrests     Locomotion Ambulation     Max distance: 120' Assist level: Touching or steadying assistance (Pt > 75%)   Wheelchair   Type: Manual Max wheelchair distance: 120' (BLE) Assist Level: Touching or steadying assistance (Pt > 75%)  Cognition Comprehension Comprehension assist level: Understands basic 75 - 89% of the time/ requires cueing 10 - 24% of the time  Expression Expression assist level: Expresses basic 90% of the time/requires cueing < 10% of the time.  Social Interaction Social Interaction assist level: Interacts appropriately 75 - 89% of the time - Needs redirection for appropriate language or to initiate interaction.  Problem Solving Problem solving assist level: Solves basic 75 - 89% of the time/requires cueing 10 - 24% of the time  Memory Memory assist level: Recognizes or recalls 75 - 89% of the time/requires cueing 10 - 24% of the time    Medical Problem List and Plan: 1.  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   Completed Zovirax as directed per infectious disease. Spoke to ID per insistence of husband regarding repeat MRI - no indication at this time.   Prednisone d/ced  Cont CIR  Trial propranolol   -symptoms yesterday improved with reassurance/distraction--exam stable today.  2.  DVT Prophylaxis/Anticoagulation: Monitor platelet counts and any signs of bleeding.    Vascular study showing acute LLE DVT, started Xarelto 3. Pain Management:   Neurontin 100 mg 3 times a day, will increase to 200 TID.  Spoke with pt and discussed concerns, including husband's concern of lethargy.  She states she would like to  increase the dose as she believes this works well for her.   Valium 5 mg every 4 hours as needed muscle spasms, hydrocodone as needed  Prozaac started 2/16 4. Mood: Xanax 1 mg daily as needed  Trial propranolol 5 TID  -team to provide positive reinforcement 5. Neuropsych: This patient is capable of making decisions on her own behalf. 6. Skin/Wound Care: Routine skin checks 7. Fluids/Electrolytes/Nutrition: Routine I&Os  BMP within acceptable range on 2/12  Labs ordered for Monday 8. GERD. Protonix 9. Constipation. Laxative assistance.  Increased bowel reg on 2/9  Improving 9. ABLA: Resolved  Hb 12.4 on 2/12  Labs ordered for Monday  Cont to monitor 10. Neurogenic bladder  Monitor with I/O caths PRN  PVRs a little lower (90's)  -intermittent incontinence  -timed voiding attempts  Trial of bethanechol 5 TID, increased to 10 on 2/16  11. Abd muscle spasms  Confounded by UTI  Baclofen 5 TID trail 2/11, increased to 10 on 2/12, d/ced per husband on 2/13 12. Acute lower UTI  UA+, Ucx Klebsiella Oxytoca  Macrobid completed 2/8-2/14   LOS (Days) 13 A FACE TO FACE EVALUATION WAS PERFORMED  SWARTZ,ZACHARY T 10/21/2016 9:26 AM

## 2016-10-22 ENCOUNTER — Inpatient Hospital Stay (HOSPITAL_COMMUNITY): Payer: PPO | Admitting: Physical Therapy

## 2016-10-22 ENCOUNTER — Inpatient Hospital Stay (HOSPITAL_COMMUNITY): Payer: PPO

## 2016-10-22 LAB — BASIC METABOLIC PANEL
Anion gap: 11 (ref 5–15)
BUN: 10 mg/dL (ref 6–20)
CHLORIDE: 100 mmol/L — AB (ref 101–111)
CO2: 27 mmol/L (ref 22–32)
CREATININE: 0.77 mg/dL (ref 0.44–1.00)
Calcium: 9.3 mg/dL (ref 8.9–10.3)
GFR calc non Af Amer: >60 mL/min (ref >60)
Glucose, Bld: 92 mg/dL (ref 65–99)
POTASSIUM: 4.1 mmol/L (ref 3.5–5.1)
SODIUM: 138 mmol/L (ref 135–145)

## 2016-10-22 LAB — CBC WITH DIFFERENTIAL/PLATELET
Basophils Absolute: 0 10*3/uL (ref 0.0–0.1)
Basophils Relative: 0 %
Eosinophils Absolute: 0.4 10*3/uL (ref 0.0–0.7)
Eosinophils Relative: 4 %
HCT: 40.9 % (ref 36.0–46.0)
HEMOGLOBIN: 13.2 g/dL (ref 12.0–15.0)
LYMPHS ABS: 2.5 10*3/uL (ref 0.7–4.0)
Lymphocytes Relative: 29 %
MCH: 31.9 pg (ref 26.0–34.0)
MCHC: 32.3 g/dL (ref 30.0–36.0)
MCV: 98.8 fL (ref 78.0–100.0)
Monocytes Absolute: 0.7 10*3/uL (ref 0.1–1.0)
Monocytes Relative: 8 %
NEUTROS PCT: 59 %
Neutro Abs: 5.1 10*3/uL (ref 1.7–7.7)
Platelets: 178 10*3/uL (ref 150–400)
RBC: 4.14 MIL/uL (ref 3.87–5.11)
RDW: 15.3 % (ref 11.5–15.5)
WBC: 8.6 10*3/uL (ref 4.0–10.5)

## 2016-10-22 MED ORDER — BETHANECHOL CHLORIDE 25 MG PO TABS
25.0000 mg | ORAL_TABLET | Freq: Three times a day (TID) | ORAL | Status: DC
  Administered 2016-10-22 – 2016-10-26 (×12): 25 mg via ORAL
  Filled 2016-10-22 (×12): qty 1

## 2016-10-22 NOTE — Progress Notes (Signed)
Physical Therapy Session Note  Patient Details  Name: Katelyn Lamb MRN: [id number] Date of Birth: 09-03-1946  Today's Date: 10/22/2016 PT Individual Time: 0800-0900 PT Individual Time Calculation (min): 60 min   Short Term Goals: Week 2:  PT Short Term Goal 1 (Week 2): Pt will be able to perform basic transfers with min assist PT Short Term Goal 2 (Week 2): Pt will be able to gait x 150' with min assist PT Short Term Goal 3 (Week 2): Pt will be able to perform 4 stairs with mod assist  Skilled Therapeutic Interventions/Progress Updates: Pt presented in bed with nsg and family present. HHA to toilet with maxA for doffing brief. Stand to sit from toilet minA with no cues and decreased posterior lean.  MaxA for breakfast set up. Pt ate breakfast sitting at EOB unsupported maintaining good sitting balance. Ambulated with HHA cues for maintaining task with gait, improved balance/gait with decreased distractions. Performed standing balance/coordination grasping objects reaching with 75% accuracy and x 1 occurrence dropping object. Pt returned to room/bed with family present and all current needs met.      Therapy Documentation Precautions:  Precautions Precautions: Fall Restrictions Weight Bearing Restrictions: No General:   Vital Signs:   Pain: Pain Assessment Pain Assessment: 0-10 Pain Score: 3  Pain Type: Chronic pain Pain Location: Back Pain Descriptors / Indicators: Aching Pain Frequency: Intermittent Pain Onset: On-going Pain Intervention(s): Medication (See eMAR)   See Function Navigator for Current Functional Status.   Therapy/Group: Individual Therapy  Tayden Duran  Enna Warwick, PTA  10/22/2016, 12:07 PM

## 2016-10-22 NOTE — Progress Notes (Signed)
Occupational Therapy Session Note  Patient Details  Name: Katelyn Lamb MRN: [id number] Date of Birth: 11/25/1945  Today's Date: 10/22/2016 OT Individual Time: 1630-16:58 28 min    Short Term Goals: Week  2:    Skilled Therapeutic Interventions/Progress Updates:    Upon entering room, Pt supine in bed, not complaining of pain and agreeable to tx. Pt requests to go to the bathroom and ambulates to toilet with HHA with MOD A for balance. 1 LOB to R noted with MOD A to correct. Pt voids urine and requires TOTAL A for toileting tasks. HOH A provided d/t decreased coordination/ataxia of BUE to advance pants up/down hips. Pt sits EOB with 1# wrist weight on RUE to retrieve small candies placed on wash cloth to improve FMC. Pt able to eat 5 candies with significantly increased time. Pt reports mild back pain and RN notified. Pt sitting EOB>supine with CGA for LE management and exited room with call light in reach and all needs met.   Therapy Documentation Precautions:  Precautions Precautions: Fall Restrictions Weight Bearing Restrictions: No General:   Vital Signs: Therapy Vitals Temp: 98.4 F (36.9 C) Temp Source: Oral Pulse Rate: 77 Resp: 18 BP: (!) 84/53 Patient Position (if appropriate): Lying Oxygen Therapy SpO2: 94 % O2 Device: Not Delivered Pain:   ADL:   Exercises:   Other Treatments:    See Function Navigator for Current Functional Status.   Therapy/Group: Individual Therapy   M  10/22/2016, 5:02 PM 

## 2016-10-22 NOTE — Progress Notes (Signed)
[place] PHYSICAL MEDICINE & REHABILITATION     PROGRESS NOTE  Subjective/Complaints:  Pt seen laying in bed this AM.  She slept well overnight. She had a good weekend.  She has questions about rash on her arm.   ROS: Denies nausea, vomiting, diarrhea, shortness of breath or chest pain   Objective: Vital Signs: Blood pressure 101/60, pulse 72, temperature 98.9 F (37.2 C), temperature source Oral, resp. rate 18, weight 64.6 kg (142 lb 6.4 oz), SpO2 99 %. No results found. No results for input(s): WBC, HGB, HCT, PLT in the last 72 hours. No results for input(s): NA, K, CL, GLUCOSE, BUN, CREATININE, CALCIUM in the last 72 hours.  Invalid input(s): CO CBG (last 3)  No results for input(s): GLUCAP in the last 72 hours.  Wt Readings from Last 3 Encounters:  10/10/16 64.6 kg (142 lb 6.4 oz)  10/02/16 63.5 kg (140 lb)  09/20/16 65 kg (143 lb 3.2 oz)    Physical Exam:  BP 101/60 (BP Location: Right Arm)   Pulse 72   Temp 98.9 F (37.2 C) (Oral)   Resp 18   Wt 64.6 kg (142 lb 6.4 oz)   SpO2 99%   BMI 22.98 kg/m  Constitutional: She appears well-developed. Frail  HENT: Normocephalic and atraumatic.  Eyes: EOMI. No discharge.  Cardiovascular: RRR. No JVD. Respiratory: normal effort. Clear GI: Soft. Bowel sounds are normal.  Musculoskeletal: She exhibits no edema or tenderness. Atrophy b/l hands. Neurological: She is alert and oriented.  Sensation diminished to light touch distal to neck  Bilateral ataxia. Motor: B/l UE: 4-/5 proximal to distal (stable). B/l LE: 4/5 proximal to distal (stable) Skin: Skin is warm and dry.  Psychiatric: She has a normal mood and affect. Bright, less anxious  Assessment/Plan: 1. Functional deficits secondary to incomplete paraplegia which require 3+ hours per day of interdisciplinary therapy in a comprehensive inpatient rehab setting. Physiatrist is providing close team supervision and 24 hour management of active medical problems listed  below. Physiatrist and rehab team continue to assess barriers to discharge/monitor patient progress toward functional and medical goals.  Function:  Bathing Bathing position   Position: Shower  Bathing parts Body parts bathed by patient: Right arm, Left arm, Chest, Abdomen, Front perineal area, Right upper leg, Left upper leg, Right lower leg, Left lower leg, Buttocks Body parts bathed by helper: Back  Bathing assist Assist Level: Assistive device, Touching or steadying assistance(Pt > 75%) Assistive Device Comment: bath mitt    Upper Body Dressing/Undressing Upper body dressing   What is the patient wearing?: Pull over shirt/dress     Pull over shirt/dress - Perfomed by patient: Put head through opening, Pull shirt over trunk Pull over shirt/dress - Perfomed by helper: Thread/unthread left sleeve, Thread/unthread right sleeve        Upper body assist Assist Level:  (Mod A)      Lower Body Dressing/Undressing Lower body dressing   What is the patient wearing?: Pants, Shoes Underwear - Performed by patient: Thread/unthread right underwear leg, Thread/unthread left underwear leg Underwear - Performed by helper: Pull underwear up/down Pants- Performed by patient: Thread/unthread right pants leg Pants- Performed by helper: Thread/unthread left pants leg, Pull pants up/down   Non-skid slipper socks- Performed by helper: Don/doff right sock, Don/doff left sock       Shoes - Performed by helper: Don/doff right shoe, Don/doff left shoe, Fasten right, Fasten left       TED Hose - Performed by helper: Don/doff right  TED hose, Don/doff left TED hose  Lower body assist Assist for lower body dressing: Touching or steadying assistance (Pt > 75%)      Toileting Toileting   Toileting steps completed by patient: Performs perineal hygiene, Adjust clothing prior to toileting Toileting steps completed by helper: Adjust clothing prior to toileting, Performs perineal hygiene, Adjust  clothing after toileting Toileting Assistive Devices: Grab bar or rail  Toileting assist Assist level:  (mod A)   Transfers Chair/bed transfer   Chair/bed transfer method: Ambulatory Chair/bed transfer assist level: Touching or steadying assistance (Pt > 75%) Chair/bed transfer assistive device: Armrests     Locomotion Ambulation     Max distance: 200 ft Assist level: Touching or steadying assistance (Pt > 75%)   Wheelchair   Type: Manual Max wheelchair distance: 120' (BLE) Assist Level: Touching or steadying assistance (Pt > 75%)  Cognition Comprehension Comprehension assist level: Understands basic 90% of the time/cues < 10% of the time  Expression Expression assist level: Expresses basic 90% of the time/requires cueing < 10% of the time.  Social Interaction Social Interaction assist level: Interacts appropriately with others - No medications needed.  Problem Solving Problem solving assist level: Solves basic 90% of the time/requires cueing < 10% of the time  Memory Memory assist level: Recognizes or recalls 90% of the time/requires cueing < 10% of the time    Medical Problem List and Plan: 1.  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   Completed Zovirax as directed per infectious disease. Spoke to ID per insistence of husband regarding repeat MRI - no indication at this time.   Prednisone d/ced  Cont CIR  Trial propranolol   Weekend notes reviewed, pt with cognitive symptoms affecting functional progress 2.  DVT Prophylaxis/Anticoagulation: Monitor platelet counts and any signs of bleeding.    Vascular study showing acute LLE DVT, started Xarelto 3. Pain Management:   Neurontin 100 mg 3 times a day, will increase to 200 TID.  Spoke with pt and discussed concerns, including husband's concern of lethargy.  She states she would like to increase the dose as she believes this works well for her.   Valium 5 mg every 4 hours as  needed muscle spasms, hydrocodone as needed  Prozaac started 2/16 4. Mood: Xanax 1 mg daily as needed  Trial propranolol 5 TID  -team to provide positive reinforcement 5. Neuropsych: This patient is capable of making decisions on her own behalf. 6. Skin/Wound Care: Routine skin checks 7. Fluids/Electrolytes/Nutrition: Routine I&Os  BMP within acceptable range on 2/12  Labs pending 8. GERD. Protonix 9. Constipation. Laxative assistance.  Increased bowel reg on 2/9  Improving 9. ABLA: Resolved  Hb 12.4 on 2/12  Labs pending  Cont to monitor 10. Neurogenic bladder  Monitor with I/O caths PRN  PVRs ?improving  -intermittent incontinence  -timed voiding attempts  Trial of bethanechol 5 TID, increased to 10 on 2/16, increased to 25 on 2/19 11. Abd muscle spasms  Baclofen 5 TID trail 2/11, increased to 10 on 2/12, d/ced per husband on 2/13 12. Acute lower UTI  UA+, Ucx Klebsiella Oxytoca  Macrobid completed 2/8-2/14   LOS (Days) 14 A FACE TO FACE EVALUATION WAS PERFORMED  Katelyn Lamb Lorie Phenix 10/22/2016 10:02 AM

## 2016-10-22 NOTE — Plan of Care (Signed)
Problem: RH Balance Goal: LTG Patient will maintain dynamic standing balance (PT) LTG:  Patient will maintain dynamic standing balance with assistance during mobility activities (PT)  Downgraded due to balance/safety. ABG  Problem: RH Bed to Chair Transfers Goal: LTG Patient will perform bed/chair transfers w/assist (PT) LTG: Patient will perform bed/chair transfers with assistance, with/without cues (PT).  Downgraded due to safety/balance. ABG  Problem: RH Car Transfers Goal: LTG Patient will perform car transfers with assist (PT) LTG: Patient will perform car transfers with assistance (PT).  Downgraded due to safety/balance. ABG  Problem: RH Ambulation Goal: LTG Patient will ambulate in controlled environment (PT) LTG: Patient will ambulate in a controlled environment, # of feet with assistance (PT).  Distance increased but level of assist downgraded due to safety/balance. ABG Goal: LTG Patient will ambulate in home environment (PT) LTG: Patient will ambulate in home environment, # of feet with assistance (PT).  Downgraded due to safety/balance. ABG

## 2016-10-22 NOTE — Progress Notes (Signed)
Occupational Therapy Session Note  Patient Details  Name: Katelyn Lamb MRN: [id number] Date of Birth: 05/12/1946  Today's Date: 10/22/2016 OT Individual Time: [id number] OT Individual Time Calculation (min): 74 min    Short Term Goals: Week 2:  OT Short Term Goal 1 (Week 2): Pt will perform toileting with mod A balance while pt performs clothing management with increased time. OT Short Term Goal 2 (Week 2): Pt will perform LB dressing with mod A in order to decrease level of assist with self care.  OT Short Term Goal 3 (Week 2): Pt will perform grooming while standing at sink with min A for balance.   Skilled Therapeutic Interventions/Progress Updates:    Upon entering the room, pt supine in bed with no c/o pain this session. Pt performs supine >sit with min A to EOB. Pt ambulates with min hand held assist into bathroom for bathing. Pt engaged in bathing at shower level while seated on TTB. Pt utilized bath mitt on R hand in order to increase independence. Pt standing with min A for balance and she was able to wash buttocks and peri area with mitt. Pt transferred to commode for drying before ambulating to EOB to don clothing items. Pt able to pull LB clothing over R hip this session which she has not been able to do. Pt also able to pull shirt over neck and down trunk this session which she has not been previously about to do. Pt ambulating from room without use of AD and min A for balance. Pt given cues to relax arms at side and gently move back and forth to help with balance when ambulating. Pt looking at pictures on wall and impulsively sitting in rolling chair which therapist had to block wheels for safety. Pt scooting to edge of chair where therapist holding pt up underarms and given cues to stand. Pt needing total A to come to full stand. She reports no pain at this time and ambulated back to room in same manner as above. Call bell and all needed items within reach.   Therapy  Documentation Precautions:  Precautions Precautions: Fall Restrictions Weight Bearing Restrictions: No  See Function Navigator for Current Functional Status.   Therapy/Group: Individual Therapy  [name] 10/22/2016, 2:29 PM

## 2016-10-22 NOTE — Progress Notes (Signed)
Physical Therapy Session Note  Patient Details  Name: Katelyn Lamb MRN: [id number] Date of Birth: 1946/05/20  Today's Date: 10/22/2016 PT Individual Time: [id number] PT Individual Time Calculation (min): 33 min   Skilled Therapeutic Interventions/Progress Updates:    Session focused on family education with hands-on training with patient's husband. Focused on bed mobility in regular (28" height) bed in ADL apartment, basic transfers, gait with HHA, and simulated low car transfer (mod assist for lifting assist to get out of car). Educated on proper body mechanics, positioning of UE with HHA, and safety overall. Husband, [name], able to provide appropriate level of assist and cues to patient for safety. Checked off on safety plan for  Bed <> chair transfers and gait in room. Recommended to practice toileting with OT in future session.   Therapy Documentation Precautions:  Precautions Precautions: Fall Restrictions Weight Bearing Restrictions: No   Pain:  c/o R knee pain - recommended ice at end of session. RN aware.   See Function Navigator for Current Functional Status.   Therapy/Group: Individual Therapy  [name], PT, DPT  10/22/2016, 2:22 PM

## 2016-10-22 NOTE — Plan of Care (Signed)
Problem: RH Balance Goal: LTG Patient will maintain dynamic standing with ADLs (OT) LTG:  Patient will maintain dynamic standing balance with assist during activities of daily living (OT)   Downgraded secondary to safety with balance  Problem: RH Grooming Goal: LTG Patient will perform grooming w/assist,cues/equip (OT) LTG: Patient will perform grooming with assist, with/without cues using equipment (OT)  Downgraded secondary to safety with balance  Problem: RH Bathing Goal: LTG Patient will bathe with assist, cues/equipment (OT) LTG: Patient will bathe specified number of body parts with assist with/without cues using equipment (position)  (OT)  Downgraded secondary to safety with balance  Problem: RH Dressing Goal: LTG Patient will perform upper body dressing (OT) LTG Patient will perform upper body dressing with assist, with/without cues (OT).  Downgraded secondary to safety with balance Goal: LTG Patient will perform lower body dressing w/assist (OT) LTG: Patient will perform lower body dressing with assist, with/without cues in positioning using equipment (OT)  Downgraded secondary to safety with balance  Problem: RH Toileting Goal: LTG Patient will perform toileting w/assist, cues/equip (OT) LTG: Patient will perform toiletiing (clothes management/hygiene) with assist, with/without cues using equipment (OT)  Downgraded secondary to safety with balance  Problem: RH Simple Meal Prep Goal: LTG Patient will perform simple meal prep w/assist (OT) LTG: Patient will perform simple meal prep with assistance, with/without cues (OT).  Outcome: Not Applicable Date Met: 73/71/06 Discontinue goal as it is no longer pt focus at this time  Problem: RH Laundry Goal: LTG Patient will perform laundry w/assist, cues (OT) LTG: Patient will perform laundry with assistance, with/without cues (OT).  Outcome: Not Applicable Date Met: 26/94/85 Discontinued goal as it is no longer pt focus at this  time  Problem: RH Toilet Transfers Goal: LTG Patient will perform toilet transfers w/assist (OT) LTG: Patient will perform toilet transfers with assist, with/without cues using equipment (OT)  Downgraded secondary to safety with balance

## 2016-10-23 ENCOUNTER — Ambulatory Visit (HOSPITAL_REHABILITATION): Payer: PPO | Admitting: Psychology

## 2016-10-23 ENCOUNTER — Inpatient Hospital Stay (HOSPITAL_COMMUNITY): Payer: PPO | Admitting: Physical Therapy

## 2016-10-23 ENCOUNTER — Inpatient Hospital Stay (HOSPITAL_COMMUNITY): Payer: PPO | Admitting: Occupational Therapy

## 2016-10-23 DIAGNOSIS — B0082 Herpes simplex myelitis: Secondary | ICD-10-CM

## 2016-10-23 DIAGNOSIS — G8222 Paraplegia, incomplete: Secondary | ICD-10-CM

## 2016-10-23 DIAGNOSIS — G0490 Encephalitis and encephalomyelitis, unspecified: Secondary | ICD-10-CM

## 2016-10-23 DIAGNOSIS — F419 Anxiety disorder, unspecified: Secondary | ICD-10-CM

## 2016-10-23 NOTE — Consult Note (Signed)
  Neuropsychological Consultation:  Family meeting with Patient.   Meet with the patient and family about the concerns they have both currently and what will happen after discharge.  They had a wide range of questions, some of which were outside of my purview.  The patient does appear to be doing better since my first visit with regard to anxiety.  The Husband and children have a lot of concerns about how follow-up will proceed and what to look at regarding possible return.  Answered the many questions the best I could and focused on specific neuropsychological issues related to her recovery and treatment.  Worked on coping skills for both the patient as well as family members.  Will review these question and issues raised during Team meeting on 10/24/2016.

## 2016-10-23 NOTE — Consult Note (Signed)
  Neuropsychological Consultation: Family meeting with Patient.   Meet with the patient and family about the concerns they have both currently and what will happen after discharge. They had a wide range of questions, some of which were outside of my purview. The patient does appear to be doing better since my first visit with regard to anxiety. The Husband and children have a lot of concerns about how follow-up will proceed and what to look at regarding possible return. Answered the many questions the best I could and focused on specific neuropsychological issues related to her recovery and treatment. Worked on coping skills for both the patient as well as family members. Will review these question and issues raised during Team meeting on 10/24/2016.

## 2016-10-23 NOTE — Progress Notes (Signed)
Occupational Therapy Session Note  Patient Details  Name: Katelyn Lamb MRN: [id number] Date of Birth: 02/11/1946  Today's Date: 10/23/2016 OT Individual Time: [id number] OT Individual Time Calculation (min): 75 min    Short Term Goals: Week 2:  OT Short Term Goal 1 (Week 2): Pt will perform toileting with mod A balance while pt performs clothing management with increased time. OT Short Term Goal 2 (Week 2): Pt will perform LB dressing with mod A in order to decrease level of assist with self care.  OT Short Term Goal 3 (Week 2): Pt will perform grooming while standing at sink with min A for balance.   Skilled Therapeutic Interventions/Progress Updates:    Upon entering the room, pt supine in bed with RN present and giving medications. Pt verbalized 4/10 pain in R knee this session but agreeable to OT intervention. Pt required min verbal cues for anterior weight shift for sit <>stand with steady assistance. Pt ambulating to dresser and sitting in chair, able to open drawer and obtain clothing items with increased time and min cues for technique. Pt ambulated to bathroom and bathing from seated position on TTB with steady assistance when reaching downward to wash feet. Pt standing to wash buttocks and peri area with steady assistance for standing balance. Pt utilized R UE with bath mitt and L UE holding onto grab bar in standing. Pt returned to sit on EOB to don clothing items. Pt able to pull R pants over R hip with gross grasp. Hand over hand assistance to place L hand over pants and pull up. Pt returning to supine at end of session secondary to fatigue at end of session. Call bell and all needed items within reach upon exiting the room.   Therapy Documentation Precautions:  Precautions Precautions: Fall Restrictions Weight Bearing Restrictions: No General:   Vital Signs:   Pain: Pain Assessment Pain Score: 2  ADL:   Exercises:   Other Treatments:    See Function Navigator for  Current Functional Status.   Therapy/Group: Individual Therapy  [name] 10/23/2016, 2:19 PM

## 2016-10-23 NOTE — Progress Notes (Signed)
Physical Therapy Session Note  Patient Details  Name: Katelyn Lamb MRN: [id number] Date of Birth: [birth year]  Today's Date: 10/23/2016 PT Individual Time: 1100-1200 1418-1530 PT Individual Time Calculation (min): 60 min and 72 min   Short Term Goals: Week 2:  PT Short Term Goal 1 (Week 2): Pt will be able to perform basic transfers with min assist PT Short Term Goal 2 (Week 2): Pt will be able to gait x 150' with min assist PT Short Term Goal 3 (Week 2): Pt will be able to perform 4 stairs with mod assist  Skilled Therapeutic Interventions/Progress Updates:  Tx1: Pt presented in bed asleep but easily aroused. Pt c/o increased pain in back and knee. Performed supine to sit with minA and HOB elevated. Performed sit to stand cues for decreased pushing against bed with LE. Transferred to toilet for timed toileting (+ void). Performed sit to/from stand at toilet with [place] for donning/doffing clothes. Performed hand hygiene at sink with minA for balance/safety. Transported to ortho gym due to R knee pain and performed standing balance with UE coordination activities.  Pt able to reach/grasp 101 successfully with RUE and 9/10 LUE reaching outside BOS with PTA providing minA for balance and cues for maintaining full knee extension. Pt ambulated partially back to room with HHA and returned to bed per pt request with pt performing sit to supine with min guard and use of bed rail. Pt left in bed with call bell within reach and needs met.   Tx2: Pt presented in bed with family present. Performed supine to sit with supervision and use of bed rail. Husband present to continue family ed. Observed husband perform sit to stand and toilet transfers with minA. Total assist for donning shoes for time management. Pt ambulated with husband HHA with husband and PTA providing cues for looking forward and staying task. Pt ascend/decend 4 steps 6in no rail with modA. Husband participated in stair management x4 stairs  with modA. Continued reinforcement of sit to/from stand transfers with pt demonstrating decreased posterior lean when unable to brace legs against object. Performed from standard chair and bed total x8 initially requiting modA and progressing to [place].  Pt ambulated to room with pt providing HHA total 242f no LOB however pt would occasionally stagger when attempting to speak with husband. Pt returned to bed sit to supine with supervision and bed flat. Pt left in bed with husband present and all current needs met.      Therapy Documentation Precautions:  Precautions Precautions: Fall Restrictions Weight Bearing Restrictions: No   See Function Navigator for Current Functional Status.   Therapy/Group: Individual Therapy  Fidela Cieslak  Daegan Arizmendi, PTA  10/23/2016, 3:38 PM

## 2016-10-23 NOTE — Progress Notes (Signed)
[facility] PHYSICAL MEDICINE & REHABILITATION     PROGRESS NOTE  Subjective/Complaints:  Pt sitting up in bed, eating breakfast by herself this AM.  She states she slept well overnight and feels well this AM.    ROS: Denies nausea, vomiting, diarrhea, shortness of breath or chest pain   Objective: Vital Signs: Blood pressure 94/61, pulse 79, temperature 99 F (37.2 C), temperature source Oral, resp. rate 18, weight 64.6 kg (142 lb 6.4 oz), SpO2 95 %. No results found.  Recent Labs  10/22/16 1040  WBC 8.6  HGB 13.2  HCT 40.9  PLT 178    Recent Labs  10/22/16 1040  NA 138  K 4.1  CL 100*  GLUCOSE 92  BUN 10  CREATININE 0.77  CALCIUM 9.3   CBG (last 3)  No results for input(s): GLUCAP in the last 72 hours.  Wt Readings from Last 3 Encounters:  10/10/16 64.6 kg (142 lb 6.4 oz)  10/02/16 63.5 kg (140 lb)  09/20/16 65 kg (143 lb 3.2 oz)    Physical Exam:  BP 94/61 (BP Location: Right Arm)   Pulse 79   Temp 99 F (37.2 C) (Oral)   Resp 18   Wt 64.6 kg (142 lb 6.4 oz)   SpO2 95%   BMI 22.98 kg/m  Constitutional: She appears well-developed. Frail  HENT: Normocephalic and atraumatic.  Eyes: EOMI. No discharge.  Cardiovascular: RRR. No JVD. Respiratory: Normal effort. Clear GI: Soft. Bowel sounds are normal.  Musculoskeletal: She exhibits no edema or tenderness. Atrophy b/l hands. Neurological: She is alert and oriented.  Sensation diminished to light touch distal to neck  Bilateral ataxia. Motor: B/l UE: 4-/5 proximal to distal (unchanged). B/l LE: 4/5 proximal to distal (stable) Skin: Left arm lesion with blister, stable.  Psychiatric: She has a normal mood and affect. Bright, less anxious  Assessment/Plan: 1. Functional deficits secondary to incomplete paraplegia which require 3+ hours per day of interdisciplinary therapy in a comprehensive inpatient rehab setting. Physiatrist is providing close team supervision and 24 hour management of active medical  problems listed below. Physiatrist and rehab team continue to assess barriers to discharge/monitor patient progress toward functional and medical goals.  Function:  Bathing Bathing position   Position: Shower  Bathing parts Body parts bathed by patient: Right arm, Left arm, Chest, Abdomen, Front perineal area, Right upper leg, Left upper leg, Right lower leg, Left lower leg, Buttocks Body parts bathed by helper: Back  Bathing assist Assist Level: Assistive device, Touching or steadying assistance(Pt > 75%) Assistive Device Comment: bath mitt    Upper Body Dressing/Undressing Upper body dressing   What is the patient wearing?: Pull over shirt/dress     Pull over shirt/dress - Perfomed by patient: Put head through opening, Pull shirt over trunk Pull over shirt/dress - Perfomed by helper: Thread/unthread right sleeve, Thread/unthread left sleeve        Upper body assist Assist Level:  (mod A)      Lower Body Dressing/Undressing Lower body dressing   What is the patient wearing?: Underwear, Pants, Non-skid slipper socks Underwear - Performed by patient: Thread/unthread right underwear leg, Thread/unthread left underwear leg Underwear - Performed by helper: Pull underwear up/down Pants- Performed by patient: Thread/unthread right pants leg, Thread/unthread left pants leg Pants- Performed by helper: Pull pants up/down   Non-skid slipper socks- Performed by helper: Don/doff right sock, Don/doff left sock       Shoes - Performed by helper: Don/doff right shoe, Don/doff left shoe,  Fasten right, Fasten left       TED Hose - Performed by helper: Don/doff right TED hose, Don/doff left TED hose  Lower body assist Assist for lower body dressing:  (mod A)      Toileting Toileting   Toileting steps completed by patient: Performs perineal hygiene Toileting steps completed by helper: Adjust clothing prior to toileting, Adjust clothing after toileting, Performs perineal  hygiene Toileting Assistive Devices: Grab bar or rail  Toileting assist Assist level:  (MAX A)   Transfers Chair/bed transfer   Chair/bed transfer method: Ambulatory Chair/bed transfer assist level: Touching or steadying assistance (Pt > 75%) Chair/bed transfer assistive device: Armrests     Locomotion Ambulation     Max distance: 200 ft Assist level: Touching or steadying assistance (Pt > 75%)   Wheelchair   Type: Manual Max wheelchair distance: 120' (BLE) Assist Level: Touching or steadying assistance (Pt > 75%)  Cognition Comprehension Comprehension assist level: Understands basic 90% of the time/cues < 10% of the time  Expression Expression assist level: Expresses basic 90% of the time/requires cueing < 10% of the time.  Social Interaction Social Interaction assist level: Interacts appropriately with others - No medications needed.  Problem Solving Problem solving assist level: Solves basic 90% of the time/requires cueing < 10% of the time  Memory Memory assist level: Recognizes or recalls 90% of the time/requires cueing < 10% of the time    Medical Problem List and Plan: 1.  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   Completed Zovirax as directed per infectious disease. Spoke to ID per insistence of husband regarding repeat MRI - no indication at this time.   Prednisone d/ced  Cont CIR  Trial propranolol started 2/16 2.  DVT Prophylaxis/Anticoagulation: Monitor platelet counts and any signs of bleeding.    Vascular study showing acute LLE DVT, started Xarelto 3. Pain Management:   Neurontin 100 mg 3 times a day, will increase to 200 TID.  Spoke with pt and discussed concerns, including husband's concern of lethargy.  She states she would like to increase the dose as she believes this works well for her.   Valium 5 mg every 4 hours as needed muscle spasms, hydrocodone as needed  Prozaac started 2/16, d/ced 2/20, per  patient 4. Mood: Xanax 1 mg daily as needed  Trial propranolol 5 TID started 2/16  -team to provide positive reinforcement 5. Neuropsych: This patient is capable of making decisions on her own behalf. 6. Skin/Wound Care: Routine skin checks 7. Fluids/Electrolytes/Nutrition: Routine I&Os  BMP within acceptable range on 2/20 8. GERD. Protonix 9. Constipation. Laxative assistance.  Increased bowel reg on 2/9  Improving 9. ABLA: Resolved  Hb 13.2 on 2/20  Cont to monitor 10. Neurogenic bladder  Monitor with I/O caths PRN  PVRs ?improving  -intermittent incontinence  -timed voiding attempts  Trial of bethanechol 5 TID, increased to 10 on 2/16, increased to 25 on 2/19, improving 11. Abd muscle spasms  Baclofen 5 TID trail 2/11, increased to 10 on 2/12, d/ced per husband on 2/13 12. Acute lower UTI  UA+, Ucx Klebsiella Oxytoca  Macrobid completed 2/8-2/14 13. Left arm blister  Localized, non-painful  No surrounding erythema.  Cont to monitor    LOS (Days) 15 A FACE TO FACE EVALUATION WAS PERFORMED  Tidus Upchurch Lorie Phenix 10/23/2016 9:02 AM

## 2016-10-23 NOTE — Consult Note (Signed)
Neuropsychological Consultation:  Family meeting with Patient.   Meet with the patient and family about the concerns they have both currently and what will happen after discharge.  They had a wide range of questions, some of which were outside of my purview.  The patient does appear to be doing better since my first visit with regard to anxiety.  The Husband and children have a lot of concerns about how follow-up will proceed and what to look at regarding possible return.  Answered the many questions the best I could and focused on specific neuropsychological issues related to her recovery and treatment.  Worked on coping skills for both the patient as well as family members.  Will review these question and issues raised during Team meeting on 10/24/2016.

## 2016-10-23 NOTE — Progress Notes (Signed)
Physical Therapy Weekly Progress Note  Patient Details  Name: Katelyn Lamb MRN: [id number] Date of Birth: November 23, 1945  Beginning of progress report period: October 15, 2016 End of progress report period: October 23, 2016  Today's Date: 10/23/2016     Patient has met 3 of 3 short term goals. Pt is demonstrating improvements in functional mobility with improved consistency with bed mobility, sit to/from stand transfers, and gait. She continues to require assistance with balance due to posterior lean with standing activities and unsteady gait. Goals were downgraded this week to overall min assist due these balance impairments and inconsistency with postural control. Family education has been initiated with hands on training with pt's husband. Planning to d/c end of week. She would continue to benefit from skilled PT to improve balance, gait, and continued family education for safety in preparation for discharge.   Patient continues to demonstrate the following deficits muscle weakness and impaired timing and sequencing, unbalanced muscle activation and decreased coordination and therefore will continue to benefit from skilled PT intervention to increase functional independence with mobility.  Patient progressing toward long term goals..  Plan of care revisions: goals were downgraded to overall min assist level this week.  PT Short Term Goals Week 2:  PT Short Term Goal 1 (Week 2): Pt will be able to perform basic transfers with min assist PT Short Term Goal 1 - Progress (Week 2): Met PT Short Term Goal 2 (Week 2): Pt will be able to gait x 150' with min assist PT Short Term Goal 2 - Progress (Week 2): Met PT Short Term Goal 3 (Week 2): Pt will be able to perform 4 stairs with mod assist PT Short Term Goal 3 - Progress (Week 2): Met  Week 3 = LTGS  Skilled Therapeutic Interventions/Progress Updates:  Ambulation/gait training;Discharge planning;DME/adaptive equipment instruction;Functional  mobility training;Pain management;Splinting/orthotics;Therapeutic Activities;UE/LE Strength taining/ROM;UE/LE Coordination activities;Therapeutic Exercise;Stair training;Patient/family education;Neuromuscular re-education;Functional electrical stimulation;Community reintegration;Balance/vestibular training;Disease management/prevention;Psychosocial support;Wheelchair propulsion/positioning;Cognitive remediation/compensation   Therapy Documentation Precautions:  Precautions Precautions: Fall Restrictions Weight Bearing Restrictions: No   See Function Navigator for Current Functional Status.  Therapy/Group: Individual Therapy  Rosita DeChalus  Rosita DeChalus, PTA 10/23/2016, 10:55 AM   Lars Masson, PT, DPT 1:55 PM 10/24/16

## 2016-10-24 ENCOUNTER — Inpatient Hospital Stay (HOSPITAL_COMMUNITY): Payer: PPO

## 2016-10-24 ENCOUNTER — Inpatient Hospital Stay (HOSPITAL_COMMUNITY): Payer: PPO | Admitting: Physical Therapy

## 2016-10-24 ENCOUNTER — Inpatient Hospital Stay (HOSPITAL_COMMUNITY): Payer: PPO | Admitting: Occupational Therapy

## 2016-10-24 MED ORDER — VALACYCLOVIR HCL 500 MG PO TABS
1000.0000 mg | ORAL_TABLET | Freq: Three times a day (TID) | ORAL | Status: DC
  Administered 2016-10-24 – 2016-10-26 (×6): 1000 mg via ORAL
  Filled 2016-10-24 (×6): qty 2

## 2016-10-24 NOTE — Progress Notes (Addendum)
[facility] for Infectious Disease    Date of Admission:  10/08/2016      ID: Katelyn Lamb is a 71 y.o. 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 During acute renab she was treated for a kleb oxytoca uti with nitrofurantoin. But recently having left arm blister for the past 3 days for which primary team would like ID to evaluate Active Problems:   Incomplete paraplegia (HCC)   Acute blood loss anemia   Neurogenic bladder   Dysuria   Acute deep vein thrombosis (DVT) of popliteal vein of left lower extremity (HCC)   Acute lower UTI   Constipation due to pain medication   Abdominal spasms   Urinary retention   Anxiety about health   Reactive depression   Ataxia    Subjective: Still having some parasthesia from primary process. Left arm blister continues to evolve. Denies significant pain  Medications:  . bethanechol  25 mg Oral TID  . diazepam  5 mg Oral BID  . feeding supplement (ENSURE ENLIVE)  237 mL Oral Q24H  . gabapentin  200 mg Oral TID  . pantoprazole  40 mg Oral Daily  . polyethylene glycol  17 g Oral Daily  . propranolol  5 mg Oral TID  . rivaroxaban  15 mg Oral BID WC   Followed by  . [START ON 10/31/2016] rivaroxaban  20 mg Oral Q supper    Objective: Vital signs in last 24 hours: Temp:  [98 F (36.7 C)] 98 F (36.7 C) (02/21 0611) Pulse Rate:  [76-91] 91 (02/21 0611) Resp:  [16] 16 (02/21 0611) BP: (91-106)/(61-65) 106/65 (02/21 0611) SpO2:  [98 %] 98 % (02/21 0611) Weight:  [138 lb 14.4 oz (63 kg)] 138 lb 14.4 oz (63 kg) (02/21  1209) Physical Exam  Constitutional:  oriented to person, place, and time. appears well-developed and well-nourished. No distress.  HENT: [place]/AT, PERRLA, no scleral icterus Mouth/Throat: Oropharynx is clear and moist. No oropharyngeal exudate.  Cardiovascular: Normal rate, regular rhythm and normal heart sounds. Exam reveals no gallop and no friction rub.  No murmur heard.  Pulmonary/Chest: Effort normal and breath sounds normal. No respiratory distress.  has no wheezes.  Neck = supple, no nuchal rigidity Abdominal: Soft. Bowel sounds are normal.  exhibits no distension. There is no tenderness.  Lymphadenopathy: no cervical adenopathy. No axillary adenopathy Neurological: alert and oriented to person, place, and time.  Skin: left upper arm has cluster of blisters, pearly appearance. Red base. 1.2cm x grouped together Psychiatric: a normal mood and affect.  behavior is normal.    Lab Results  Recent Labs  10/22/16 1040  WBC 8.6  HGB 13.2  HCT 40.9  NA 138  K 4.1  CL 100*  CO2 27  BUN 10  CREATININE 0.77    Assessment/Plan: Zoster flare= no other lesion in the dermatome. I have prepped skin, aspirated fluid to send for viral culture. Clinically looks like zoster. Recommend valtrex 1gm TID x 7 day to accelerate healing. Change dressing daily   Place patient on contact precaution. This is not disseminated  disease, no need for airborne  [facility] for Infectious Diseases Cell: [phone] Pager: [phone]  10/24/2016, 2:38 PM

## 2016-10-24 NOTE — Progress Notes (Addendum)
Physical Therapy Note  Patient Details  Name: Katelyn Lamb MRN: [id number] Date of Birth: 1946-02-20 Today's Date: 10/24/2016  1430-1535, 65 min individual tx Missed 10 min due to Infectious disease MD   Pain: neurogenic pain, unrated, premedicated  Husband Katelyn Lamb here for ongoing family ed. Katelyn Lamb ambulated with pt to gym with hand hold assistance.  neuromuscular re-education via forced use, demo, multimodal cues for reciprocal scooting forward/backward with bil LE support, without UE support.  Blocked practice for sit>< stand without use of UEs focusing on forward wt shift before LE extension against mat table.  Sustained stretch bil LE heel cords standing on wedge with mod assist due to backward lean upon standingx 1 minute x 3.  Pt stated she needed to urinate urgently.  Pt had bladder accident before reaching BR.  Toilet transfer with Katelyn Lamb to toilet; from toilet with PT.  Infections disease MD arrived to examine pt.  Gait in room to bed. Pt left in care of [place] , [place].   See function navigator for current status.   Sharlot Sturkey 10/24/2016, 3:34 PM

## 2016-10-24 NOTE — Progress Notes (Signed)
Occupational Therapy Session Note  Patient Details  Name: Katelyn Lamb MRN: [id number] Date of Birth: 02/15/1946  Today's Date: 10/24/2016 OT Individual Time: 0730-0830 OT Individual Time Calculation (min): 60 min    Short Term Goals: Week 2:  OT Short Term Goal 1 (Week 2): Pt will perform toileting with mod A balance while pt performs clothing management with increased time. OT Short Term Goal 2 (Week 2): Pt will perform LB dressing with mod A in order to decrease level of assist with self care.  OT Short Term Goal 3 (Week 2): Pt will perform grooming while standing at sink with min A for balance.   Skilled Therapeutic Interventions/Progress Updates:    Upon entering the room, pt supine in bed with c/o 9/10 back pain. RN notified and asked for pain medication. Pt agreeable to OT intervention. Pt ambulating to bathroom with min hand held assistance into bathroom for toileting. Pt needing cues to pull pants down by grabbing directing in the middle as she is unable to hook fingers to pull down. Pt unable to void but pt had soiled clothing with BM that she reports having early in the morning but not getting completely clean. Pt requesting to wash buttocks and peri area in shower for hygiene. Pt standing in shower with min A and pt washed peri area with hand mitt and OT assisted with buttocks. Pt ambulated to dresser to obtain clothing items with min assist for balance. Pt sitting on EOB to don clothing items. 1 lb wrist weight placed on R UE and built up utensil provided for pt to feed self. She performed this task with minimal spillage from spoon, occasional assist for grip on utensil, and min cues to attend to task. Pt returning to supine secondary to pain. Call bell and all needed items within reach upon exiting the room.   Therapy Documentation Precautions:  Precautions Precautions: Fall Restrictions Weight Bearing Restrictions: No  See Function Navigator for Current Functional  Status.   Therapy/Group: Individual Therapy  [name] 10/24/2016, 10:54 AM

## 2016-10-24 NOTE — Progress Notes (Signed)
[place] PHYSICAL MEDICINE & REHABILITATION     PROGRESS NOTE  Subjective/Complaints:  Pt sitting up in bed this AM.  She slept well overnight.  She continues to deny pain in left arm at site of lesion.   ROS: Denies nausea, vomiting, diarrhea, shortness of breath or chest pain   Objective: Vital Signs: Blood pressure 106/65, pulse 91, temperature 98 F (36.7 C), temperature source Oral, resp. rate 16, weight 63 kg (138 lb 14.4 oz), SpO2 98 %. No results found.  Recent Labs  10/22/16 1040  WBC 8.6  HGB 13.2  HCT 40.9  PLT 178    Recent Labs  10/22/16 1040  NA 138  K 4.1  CL 100*  GLUCOSE 92  BUN 10  CREATININE 0.77  CALCIUM 9.3   CBG (last 3)  No results for input(s): GLUCAP in the last 72 hours.  Wt Readings from Last 3 Encounters:  10/24/16 63 kg (138 lb 14.4 oz)  10/02/16 63.5 kg (140 lb)  09/20/16 65 kg (143 lb 3.2 oz)    Physical Exam:  BP 106/65 (BP Location: Right Arm)   Pulse 91   Temp 98 F (36.7 C) (Oral)   Resp 16   Wt 63 kg (138 lb 14.4 oz)   SpO2 98%   BMI 22.42 kg/m  Constitutional: She appears well-developed. Frail  HENT: Normocephalic and atraumatic.  Eyes: EOMI. No discharge.  Cardiovascular: RRR. No JVD. Respiratory: Normal effort. Clear GI: Soft. Bowel sounds are normal.  Musculoskeletal: She exhibits no edema or tenderness. Atrophy b/l hands. Neurological: She is alert and oriented.  Sensation diminished to light touch distal to neck  Bilateral ataxia. Motor: B/l UE: 4-/5 proximal to distal (Right stronger than left). B/l LE: 4/5 proximal to distal (stable) Skin: Left arm lesion with blister, stable.  Psychiatric: Somewhat flat.   Assessment/Plan: 1. Functional deficits secondary to incomplete paraplegia which require 3+ hours per day of interdisciplinary therapy in a comprehensive inpatient rehab setting. Physiatrist is providing close team supervision and 24 hour management of active medical problems listed  below. Physiatrist and rehab team continue to assess barriers to discharge/monitor patient progress toward functional and medical goals.  Function:  Bathing Bathing position   Position: Shower  Bathing parts Body parts bathed by patient: Right arm, Left arm, Chest, Abdomen, Front perineal area, Right upper leg, Left upper leg, Right lower leg, Left lower leg, Buttocks Body parts bathed by helper: Back  Bathing assist Assist Level: Assistive device, Touching or steadying assistance(Pt > 75%) Assistive Device Comment: bath mitt    Upper Body Dressing/Undressing Upper body dressing   What is the patient wearing?: Pull over shirt/dress     Pull over shirt/dress - Perfomed by patient: Thread/unthread right sleeve, Pull shirt over trunk Pull over shirt/dress - Perfomed by helper: Put head through opening, Thread/unthread left sleeve        Upper body assist Assist Level:  (mod A)      Lower Body Dressing/Undressing Lower body dressing   What is the patient wearing?: Pants, Non-skid slipper socks Underwear - Performed by patient: Thread/unthread right underwear leg, Thread/unthread left underwear leg Underwear - Performed by helper: Pull underwear up/down Pants- Performed by patient: Thread/unthread right pants leg, Thread/unthread left pants leg Pants- Performed by helper: Pull pants up/down   Non-skid slipper socks- Performed by helper: Don/doff right sock, Don/doff left sock       Shoes - Performed by helper: Don/doff right shoe, Don/doff left shoe, Fasten right, Fasten left  TED Hose - Performed by helper: Don/doff right TED hose, Don/doff left TED hose  Lower body assist Assist for lower body dressing:  (max A)      Toileting Toileting   Toileting steps completed by patient: Performs perineal hygiene Toileting steps completed by helper: Adjust clothing prior to toileting, Performs perineal hygiene, Adjust clothing after toileting Toileting Assistive Devices: Grab  bar or rail  Toileting assist Assist level:  (MAX A)   Transfers Chair/bed transfer   Chair/bed transfer method: Ambulatory Chair/bed transfer assist level: Touching or steadying assistance (Pt > 75%) Chair/bed transfer assistive device: Armrests     Locomotion Ambulation     Max distance: 270 Assist level: Touching or steadying assistance (Pt > 75%)   Wheelchair   Type: Manual Max wheelchair distance: 120' (BLE) Assist Level: Touching or steadying assistance (Pt > 75%)  Cognition Comprehension Comprehension assist level: Understands complex 90% of the time/cues 10% of the time  Expression Expression assist level: Expresses complex 90% of the time/cues < 10% of the time  Social Interaction Social Interaction assist level: Interacts appropriately 75 - 89% of the time - Needs redirection for appropriate language or to initiate interaction.  Problem Solving Problem solving assist level: Solves basic 90% of the time/requires cueing < 10% of the time  Memory Memory assist level: Recognizes or recalls 90% of the time/requires cueing < 10% of the time    Medical Problem List and Plan: 1.  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   Completed Zovirax as directed per infectious disease. Spoke to ID per insistence of husband regarding repeat MRI - no indication at this time.   Prednisone d/ced  Cont CIR  Trial propranolol started 2/16 2.  DVT Prophylaxis/Anticoagulation: Monitor platelet counts and any signs of bleeding.    Vascular study showing acute LLE DVT, started Xarelto 3. Pain Management:   Neurontin 100 mg 3 times a day, increased to 200 TID.  Spoke with pt and discussed concerns, including husband's concern of lethargy.  She states she would like to increase the dose as she believes this works well for her.   Valium 5 mg every 4 hours as needed muscle spasms, hydrocodone as needed  Prozaac started 2/16, d/ced 2/20, per  patient 4. Mood: Xanax 1 mg daily as needed  Trial propranolol 5 TID started 2/16  -team to provide positive reinforcement 5. Neuropsych: This patient is capable of making decisions on her own behalf.  Appreciate Neuropsych follow up 6. Skin/Wound Care: Routine skin checks 7. Fluids/Electrolytes/Nutrition: Routine I&Os  BMP within acceptable range on 2/20 8. GERD. Protonix 9. Constipation. Laxative assistance.  Increased bowel reg on 2/9  Improving 9. ABLA: Resolved  Hb 13.2 on 2/19  Cont to monitor 10. Neurogenic bladder  Monitor with I/O caths PRN  PVRs ?improving  -intermittent incontinence  -timed voiding attempts  Trial of bethanechol 5 TID, increased to 10 on 2/16, increased to 25 on 2/19, continues to improve 11. Abd muscle spasms  Baclofen 5 TID trail 2/11, increased to 10 on 2/12, d/ced per husband on 2/13 12. Acute lower UTI  UA+, Ucx Klebsiella Oxytoca  Macrobid completed 2/8-2/14 13. Left arm blister  Localized, non-painful  No surrounding erythema.  Cont to monitor   Will speak with ID   LOS (Days) 16 A FACE TO FACE EVALUATION WAS PERFORMED  Katelyn Lamb Lorie Phenix 10/24/2016 1:13 PM

## 2016-10-24 NOTE — Progress Notes (Signed)
Physical Therapy Session Note  Patient Details  Name: Katelyn Lamb MRN: [id number] Date of Birth: 1945-12-18  Today's Date: 10/24/2016 PT Individual Time: 0907-1010 PT Individual Time Calculation (min): 63 min   Short Term Goals: Week 3:     Skilled Therapeutic Interventions/Progress Updates: Pt presented in bed feeling anxious about today's therapy. Agreeable to participate with some encouragement. Performed supine to sit with minA, sit to stand with minA. Pt ambulated HHA to rehab gym. Performed active df/pf on rocker board x 10 bilaterally, x10 each foot for feedback with active movement. Calf stretch on wedge x 2 min with cues for decreasing posterior lean with fair results no LOB. NuStep for reciprocal movement and endurance L3 x 8 min. Returned to room and provided emotional support due to increased anxiety regarding d/c and feeling like being a burden upon family. Pt stated feeling some relief after discussion. Pt performed sit to supine with supervision and use of features. Required modA via PTA holding feet in place to boost self up in bed. Pt left with call bell within reach and needs met.      Therapy Documentation Precautions:  Precautions Precautions: Fall Restrictions Weight Bearing Restrictions: No   See Function Navigator for Current Functional Status.   Therapy/Group: Individual Therapy  Tearra Ouk  Miquel Stacks, PTA  10/24/2016, 10:59 AM

## 2016-10-25 ENCOUNTER — Inpatient Hospital Stay (HOSPITAL_COMMUNITY): Payer: PPO | Admitting: Occupational Therapy

## 2016-10-25 ENCOUNTER — Inpatient Hospital Stay (HOSPITAL_COMMUNITY): Payer: PPO

## 2016-10-25 DIAGNOSIS — B029 Zoster without complications: Secondary | ICD-10-CM

## 2016-10-25 NOTE — Plan of Care (Signed)
Problem: RH Toileting Goal: LTG Patient will perform toileting w/assist, cues/equip (OT) LTG: Patient will perform toiletiing (clothes management/hygiene) with assist, with/without cues using equipment (OT)  Outcome: Not Met (add Reason) Mod a

## 2016-10-25 NOTE — Discharge Summary (Signed)
Discharge summary job # [id number]

## 2016-10-25 NOTE — Progress Notes (Signed)
Occupational Therapy Discharge Summary and OT Intervention  Patient Details  Name: Katelyn Lamb MRN: [id number] Date of Birth: 08-02-1946  Today's Date: 10/25/2016 OT Individual Time: 0700-0800 and 1532-1610 OT Individual Time Calculation (min): 60 min and 38 min    Patient has met 8 of 9 long term goals due to improved activity tolerance, improved balance, ability to compensate for deficits and improved coordination.  Patient to discharge at overall min - mod A level.  Patient's care partner is independent to provide the necessary physical and cognitive assistance at discharge.    Reasons goals not met: Toileting goal not met as pt needs assist with clothing management  Recommendation:  Patient will benefit from ongoing skilled OT services in home health setting to continue to advance functional skills in the area of BADL and iADL.  Equipment: TTB  Reasons for discharge: treatment goals met  Patient/family agrees with progress made and goals achieved: Yes   OT Intervention:  Session 1: Upon entering the room, pt supine in bed. Pt with no c/o pain this session and agreeable to OT intervention. Pt declined bathing and dressing this session. Pt ambulated with min hand held assistance to dayroom. OT educated and demonstrated use of yellow resistive theraputty for B hand coordination exercises. Pt returned demonstrations with min verbal cues for proper technique. Pt grasping ping pong balls , one at a time, with R hand and placing into container. Pt having 1/7 drops this session. Pt ambulated back to room in same manner at end of session. Call bell and all needed items within reach upon exiting.    Session 2: Pt received in therapy gym and transitioned easily from PT session. Husband present for family education. He assisted pt with ambulating to ADL apartment . OT reviewing shower transfer with use of TTB. Caregiver returned demonstration by providing steady assistance for transfer. OT  recommended purchase of safety treads to decrease fall risk. Pt ambulated back to room where he assisted her with toileting task. OT reviewed pt's progress with ADLs and provided bath mitt, plate guard, and built up grips for home use in order to increase pt independence in home environment. OT recommended purchase of 1 lb wrist weights. No further questions at this time. Pt returned to bed at end of session with call bell and all needed items within reach.   OT Discharge Precautions/Restrictions  Precautions Precautions: Fall Vital Signs Therapy Vitals Pulse Rate: 64 BP: (!) 84/51 Pain Pain Assessment Pain Assessment: No/denies pain Pain Score: 0-No pain Pain Type: Acute pain Pain Location: Generalized Pain Descriptors / Indicators: Aching Pain Frequency: Constant Pain Intervention(s): Medication (See eMAR) Vision/Perception  Vision- History Baseline Vision/History: No visual deficits Patient Visual Report: No change from baseline  Cognition Overall Cognitive Status: Within Functional Limits for tasks assessed Arousal/Alertness: Awake/alert Orientation Level: Oriented X4 Sensation Sensation Light Touch: Impaired Detail Light Touch Impaired Details: Impaired RUE;Impaired LUE;Impaired RLE;Impaired LLE Proprioception Impaired Details: Impaired RUE;Impaired LUE;Impaired RLE;Impaired LLE Additional Comments: B UEs with greater impairment Coordination Gross Motor Movements are Fluid and Coordinated: No Fine Motor Movements are Fluid and Coordinated: No Motor  Motor Motor: Ataxia;Abnormal postural alignment and control Motor - Discharge Observations: BUE greater impairment than BLE Mobility  Transfers Transfers: Sit to Stand;Stand to Sit Sit to Stand: 4: Min assist Stand to Sit: 4: Min assist  Trunk/Postural Assessment  Cervical Assessment Cervical Assessment: Within Functional Limits Thoracic Assessment Thoracic Assessment: Within Functional Limits Lumbar  Assessment Lumbar Assessment: Within Functional Limits Postural Control Postural  Control: Deficits on evaluation Trunk Control: posterior bias though improved since admission  Balance Balance Balance Assessed: Yes Dynamic Sitting Balance Dynamic Sitting - Level of Assistance: 5: Stand by assistance Static Standing Balance Static Standing - Level of Assistance: 4: Min assist Dynamic Standing Balance Dynamic Standing - Level of Assistance: 4: Min assist Extremity/Trunk Assessment RUE Assessment RUE Assessment: Exceptions to [facility] LUE Assessment LUE Assessment: Exceptions to [facility]   See Function Navigator for Current Functional Status.  [name] 10/25/2016, 9:25 PM

## 2016-10-25 NOTE — Patient Care Conference (Signed)
Inpatient RehabilitationTeam Conference and Plan of Care Update Date: 10/24/2016   Time: 2:35 PM    Patient Name: Katelyn Lamb      Medical Record Number: [id number]  Date of Birth: December 27, 1945 Sex: Female         Room/Bed: 4W17C/4W17C-01 Payor Info: Payor: [employer] / Plan: Tenn[employer] Must / Product Type: *No Product type* /    Admitting Diagnosis: Tetraplegia Mydelitis  Admit Date/Time:  10/08/2016  6:00 PM Admission Comments: No comment available   Primary Diagnosis:  <principal problem not specified> Principal Problem: <principal problem not specified>  Patient Active Problem List   Diagnosis Date Noted  . Hypotension due to drugs   . Urinary retention   . Anxiety about health   . Reactive depression   . Ataxia   . Abdominal spasms   . Constipation due to pain medication   . Acute lower UTI   . Dysuria   . Acute deep vein thrombosis (DVT) of popliteal vein of left lower extremity ([place])   . Incomplete paraplegia ([place])   . Acute blood loss anemia   . Neurogenic bladder   . Neuropathic pain   . Muscle spasm   . Gastroesophageal reflux disease   . Slow transit constipation   . Thrombocytopenia ([place]) 10/03/2016  . Abnormal MRI, spinal cord   . Numbness   . Intractable back pain 09/20/2016  . Numbness of left lower extremity 09/20/2016  . Hyponatremia 09/20/2016  . Herpes zoster without complication 0000000    Expected Discharge Date: Expected Discharge Date: 10/26/16  Team Members Present: Physician leading conference: Dr. 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   Improve mobility,  transfers, ataxia, bladder, left arm lesion  See above   Bowel/Bladder   continent of bowel and bladder LBM 10/23/16 Time toileting q 2-3 hours while awake  min assist  monitor bowel and bladder q shift   Swallow/Nutrition/ Hydration             ADL's   set up A grooming as well as self feeding with 1 lb wrist weights, built up utensil, and weighted cup. UB self care with mod A, LB self care with max A, functional transfers min HHA  min A overall, mod A LB dressing  self care retraining, B UE coordination, balance, functional transfers, pt/ family education   Mobility   MinA/HHA sit to/from stand transfers, gait 284ft HHA   minA overall   balance, coordination, stairs, family education   Communication             Safety/Cognition/ Behavioral Observations  no unsafe behavior  min assist  monitor q shift   Pain   c/o pain to backNorco 1-2 Tabs/baclofen 10 mg BID, Neurontin 300 mg TID , given for pain  < 3  Assess and treat for pain q shift and prn   Skin   lt arm blister; culture done by ID yesterday per report; zoster flame; covered with gauze and tegaderm  no new skin breakdown with min assist  Assess skin q shift and prn; perform dressing changes to left arm as ordered.    Rehab Goals Patient on target to meet rehab  goals: Yes *See Care Plan and progress notes for long and short-term goals.  Barriers to Discharge: Mobility, transfers, DVT, neurogenic bladder, left arm lesion, neurogenic bladders, anxiety    Possible Resolutions to Barriers:  bladder meds, therapies, arm lesion, anxiety meds    Discharge Planning/Teaching Needs:  Plan home with spouse who can provide 24/7 assistance.  ongoing   Team Discussion:  meds begun for bladder - some improved?  Left arm lesion now - per MD is "stable".  MD following up with ID for direction.  Timed toileting and better continence;  Cont bowel. Set-up assist with grooming/ eating and 1# weights.  Mod/ max with self-care.  Husband has  been checked off.  On track for min assist goals.   Revisions to Treatment Plan:  None   Continued Need for Acute Rehabilitation Level of Care: The patient requires daily medical management by a physician with specialized training in physical medicine and rehabilitation for the following conditions: Daily direction of a multidisciplinary physical rehabilitation program to ensure safe treatment while eliciting the highest outcome that is of practical value to the patient.: Yes Daily medical management of patient stability for increased activity during participation in an intensive rehabilitation regime.: Yes Daily analysis of laboratory values and/or radiology reports with any subsequent need for medication adjustment of medical intervention for : Neurological problems;Urological problems;Other  Granite Godman 10/26/2016, 10:39 AM

## 2016-10-25 NOTE — Discharge Summary (Signed)
Katelyn Lamb, Katelyn Lamb NO.:  [id number]  MEDICAL RECORD NO.:  [id number]  LOCATION:                                 FACILITY:  PHYSICIAN:  Delice Lesch, MD        DATE OF BIRTH:  10-15-1945  DATE OF ADMISSION:  10/08/2016 DATE OF DISCHARGE:                              DISCHARGE SUMMARY   ANTICIPATED DISCHARGE DATE:  October 26, 2016.  DISCHARGE DIAGNOSES: 1. 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 2. Xarelto for deep vein thrombosis. 3. Pain management. 4. Zoster flare. 5. Anxiety. 6. Gastroesophageal reflux disease. 7. Constipation. 8. Acute blood loss anemia. 9. Neurogenic bladder. 10.Klebsiella -- oxytoca urinary tract infection.  HISTORY OF PRESENT ILLNESS:  This is a 71 year old right-handed female with history of anxiety, chronic back pain, presented with a 5-day history of progressive weakness over lower extremities, recently discharged from [facility], January 18 to September 26, 2016, for HSV encephalitis and myelitis.ight-handed female with history of anxiety, chronic back pain, presented with a 5-day history of progressive weakness over lower extremities, recently discharged from [facility], January 18 to September 26, 2016, for HSV encephalitis and myelitis.  Discharged to home with a PICC line in place to complete a 21-day course of intravenous acyclovir.  She lives with spouse, needed minimal assist for ambulation with a rolling walker. After being discharged to home, she was not continued on high-dose steroids previously given during hospitalization.  Reported gradual decline over functional status with associated symptoms of numbness from the nipple line down.  Noted bladder and bowel incontinence.  Presented October 03, 2016, due to increasing weakness.  MRI of the brain negative.  MRI of the spine showed improved appearance of the paramedian frontal and cingulate gyrus signal abnormality with resolved restricted diffusion and decreased enhancement.  Decreased patchy T2 signal abnormality and expansion throughout the cervical and thoracic cord. Cord enhancement from C2-C4 had increased.  Neurology follow up placed on intravenous Solu-Medrol.   Infectious Disease follow up advised to complete 21 day course of intravenous acyclovir.  Subcutaneous Lovenox for DVT prophylaxis.  Physical and occupational therapy ongoing.  The patient was admitted for a comprehensive rehab program.  PAST MEDICAL HISTORY:  See discharge diagnoses.  SOCIAL HISTORY:  Lives with spouse.  Functional status upon admission to [place], moderate assist, 20 feet rolling walker; minimal guard, sit to supine; min to mod assist with activities of daily living.  PHYSICAL EXAMINATION:  VITAL SIGNS:  Blood pressure 128/62, pulse 57, temperature 98, respirations 17. GENERAL:  This was an alert female, oriented to person, place, and time. Well developed. HEENT:  EOMs intact. NECK:  Supple.  Nontender.  No JVD. CARDIAC:  Regular rate and rhythm.  No murmur. ABDOMEN:  Soft, nontender.  Good bowel sounds. LUNGS:  Clear to auscultation, without wheeze. NEUROLOGIC:  Noted bilateral sensory loss distal to neck, bilateral ataxia.  REHABILITATION HOSPITAL COURSE:  The patient was admitted to inpatient rehab services with therapies initiated on a 3-hour daily basis, consisting of physical therapy, occupational therapy, and rehabilitation nursing.  The following issues were addressed during the patient's rehabilitation stay.  Pertaining to Mrs. Dewilde's HSV encephalitis and myelitis with C3-4 incomplete paraplegia.  She had completed a 21-day course of Zovirax as per Infectious Disease as well as a prednisone taper.  She would follow up  Outpatient Infectious Disease.  The patient on subcutaneous Lovenox for DVT prophylaxis.  Routine vascular studies completed, October 09, 2016, showing evidence of acute deep vein thrombosis involving the peroneal veins in the left lower extremity. Due to these findings, her Lovenox was discontinued and Xarelto was initiated.  There were no bleeding episodes.  Pain management with the use of Neurontin 200 mg 3 times daily.   Monitoring closely for any lethargy.  She was using Tylenol also as well as hydrocodone.  Valium 5 mg b.i.d. for spasms.  Mood with noted bouts of anxiety, on Xanax 1 mg daily as needed.Patientalso placed on low dose inderal 2/16 but discontinued 2/23 due to soft blood pressure.   Bouts of constipation resolved with laxative assistance.  Neurogenic bladder. Voiding was improving.  Bouts of intermittent incontinence, she was on Urecholine 25 mg t.i.d.  She was treated with Macrobid, February 8 to October 17, 2016, for UTI, remaining afebrile.  The patient developed a left upper extremity arm blistered area with small area of clusters, red base, 1.2 cm and grouped together.  With these findings, infectious Disease was consulted, suspect zoster flare up, placed on Valtrex 1 g t.i.d. x7 days, October 24, 2016, placed on contact precautions.  Not felt to be disseminated disease, no need for airborne precautions.  The area was aspirated and sent off for any viral culture.  The patient received weekly collaborative interdisciplinary team conferences to discuss estimated length of stay, family teaching, any barriers to discharge.  Working with energy conservation.  The patient able to complete bed mobility on a flat bed without rales during sessions at supervision level.  Instructed in simulated car transfers, ambulation up and down a ramp, ambulating over uneven matched surfaces as well as stair negotiation.  Required minimal assist overall for transfers and ambulation.  She could gather her belongings for activities of daily living and homemaking, ambulate to the bathroom with minimal handheld assistance.  Full family teaching was completed as well as full discussion for 24-hour assistance for her safety.  DISCHARGE MEDICATIONS:  Included: 1. Urecholine 25 mg p.o. t.i.d. 2. Valium 5 mg p.o. b.i.d. 3. Neurontin 200 mg p.o. t.i.d. 4. Protonix 40 mg p.o. daily. 5. MiraLAX 17 g daily, hold for  loose stools. 6. Valtrex 1000 mg p.o. t.i.d. x7 days total. 7. Xanax 1 mg daily as needed. 8. Hydrocodone 2 tablets every 4 hours as needed for pain. 9.Xarelto 15 mg b.i.d. until October 31, 2016, then begin 20 mg     daily.  DIET:  Regular.  FOLLOWUP:  She would follow up with Dr. Scharlene Gloss, Infectious Disease, call for appointment; Dr. Thelma Comp Patel's office to call for appointment; Dr. Floyde Parkins, Neurology Services; Dr. Dorthy Cooler, medical management.     Lauraine Rinne, P.A.   ______________________________ Delice Lesch, MD    DA/MEDQ  D:  10/25/2016  T:  10/25/2016  Job:  [id number]  cc:   Dr. Margette Fast Dibas Dorthy Cooler, MD Thayer Headings, MD

## 2016-10-25 NOTE — Progress Notes (Signed)
[facility] PHYSICAL MEDICINE & REHABILITATION     PROGRESS NOTE  Subjective/Complaints:  Pt sitting up in bed this AM.  She states she slept well overnight.  She has questions about lesion.  ROS: Denies nausea, vomiting, diarrhea, shortness of breath or chest pain   Objective: Vital Signs: Blood pressure (!) 87/53, pulse 87, temperature 99 F (37.2 C), temperature source Oral, resp. rate 16, weight 63 kg (138 lb 14.4 oz), SpO2 93 %. No results found. No results for input(s): WBC, HGB, HCT, PLT in the last 72 hours. No results for input(s): NA, K, CL, GLUCOSE, BUN, CREATININE, CALCIUM in the last 72 hours.  Invalid input(s): CO CBG (last 3)  No results for input(s): GLUCAP in the last 72 hours.  Wt Readings from Last 3 Encounters:  10/24/16 63 kg (138 lb 14.4 oz)  10/02/16 63.5 kg (140 lb)  09/20/16 65 kg (143 lb 3.2 oz)    Physical Exam:  BP (!) 87/53   Pulse 87   Temp 99 F (37.2 C) (Oral)   Resp 16   Wt 63 kg (138 lb 14.4 oz)   SpO2 93%   BMI 22.42 kg/m  Constitutional: She appears well-developed. Frail  HENT: Normocephalic and atraumatic.  Eyes: EOMI. No discharge.  Cardiovascular: RRR. No JVD. Respiratory: Normal effort. Clear GI: Soft. Bowel sounds are normal.  Musculoskeletal: She exhibits no edema or tenderness. Atrophy b/l hands. Neurological: She is alert and oriented.  Sensation diminished to light touch distal to neck  Bilateral ataxia. Motor: B/l UE: 4-/5 proximal to distal (Right stronger than left). B/l LE: 4/5 proximal to distal (stable) Skin: Left arm lesion with open blister.  Psychiatric: Somewhat flat.   Assessment/Plan: 1. Functional deficits secondary to incomplete paraplegia which require 3+ hours per day of interdisciplinary therapy in a comprehensive inpatient rehab setting. Physiatrist is providing close team supervision and 24 hour management of active medical problems listed below. Physiatrist and rehab team continue to assess barriers  to discharge/monitor patient progress toward functional and medical goals.  Function:  Bathing Bathing position   Position: Shower  Bathing parts Body parts bathed by patient: Right arm, Left arm, Chest, Abdomen, Front perineal area, Right upper leg, Left upper leg, Right lower leg, Left lower leg, Buttocks Body parts bathed by helper: Back  Bathing assist Assist Level: Assistive device, Touching or steadying assistance(Pt > 75%) Assistive Device Comment: bath mitt    Upper Body Dressing/Undressing Upper body dressing   What is the patient wearing?: Pull over shirt/dress     Pull over shirt/dress - Perfomed by patient: Thread/unthread right sleeve, Pull shirt over trunk Pull over shirt/dress - Perfomed by helper: Put head through opening, Thread/unthread left sleeve        Upper body assist Assist Level:  (mod A)      Lower Body Dressing/Undressing Lower body dressing   What is the patient wearing?: Pants, Non-skid slipper socks Underwear - Performed by patient: Thread/unthread right underwear leg, Thread/unthread left underwear leg Underwear - Performed by helper: Pull underwear up/down Pants- Performed by patient: Thread/unthread right pants leg, Thread/unthread left pants leg Pants- Performed by helper: Pull pants up/down   Non-skid slipper socks- Performed by helper: Don/doff right sock, Don/doff left sock       Shoes - Performed by helper: Don/doff right shoe, Don/doff left shoe, Fasten right, Fasten left       TED Hose - Performed by helper: Don/doff right TED hose, Don/doff left TED hose  Lower body assist  Assist for lower body dressing:  (max A)      Toileting Toileting   Toileting steps completed by patient: Performs perineal hygiene Toileting steps completed by helper: Adjust clothing prior to toileting, Adjust clothing after toileting Toileting Assistive Devices: Grab bar or rail  Toileting assist Assist level: Touching or steadying assistance (Pt.75%)    Transfers Chair/bed transfer   Chair/bed transfer method: Ambulatory Chair/bed transfer assist level: Touching or steadying assistance (Pt > 75%) Chair/bed transfer assistive device: Armrests     Locomotion Ambulation     Max distance: 200' Assist level: Touching or steadying assistance (Pt > 75%)   Wheelchair   Type: Manual Max wheelchair distance: 120' (BLE) Assist Level: Touching or steadying assistance (Pt > 75%)  Cognition Comprehension Comprehension assist level: Understands complex 90% of the time/cues 10% of the time  Expression Expression assist level: Expresses complex 90% of the time/cues < 10% of the time  Social Interaction Social Interaction assist level: Interacts appropriately 90% of the time - Needs monitoring or encouragement for participation or interaction.  Problem Solving Problem solving assist level: Solves basic 90% of the time/requires cueing < 10% of the time  Memory Memory assist level: Recognizes or recalls 90% of the time/requires cueing < 10% of the time    Medical Problem List and Plan: 1.  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   Completed Zovirax as directed per infectious disease. Spoke to ID per insistence of husband regarding repeat MRI - no indication at this time.   Prednisone d/ced  Cont CIR  Trial propranolol started 2/16 2.  DVT Prophylaxis/Anticoagulation: Monitor platelet counts and any signs of bleeding.    Vascular study showing acute LLE DVT, started Xarelto 3. Pain Management:   Neurontin 100 mg 3 times a day, increased to 200 TID.  Spoke with pt and discussed concerns, including husband's concern of lethargy.  She states she would like to increase the dose as she believes this works well for her.   Valium 5 mg every 4 hours as needed muscle spasms, hydrocodone as needed  Prozaac started 2/16, d/ced 2/20, per patient 4. Mood: Xanax 1 mg daily as needed  Trial propranolol 5 TID  started 2/16  -team to provide positive reinforcement 5. Neuropsych: This patient is capable of making decisions on her own behalf.  Appreciate Neuropsych follow up 6. Skin/Wound Care: Routine skin checks 7. Fluids/Electrolytes/Nutrition: Routine I&Os  BMP within acceptable range on 2/20 8. GERD. Protonix 9. Constipation. Laxative assistance.  Increased bowel reg on 2/9  Improving 9. ABLA: Resolved  Hb 13.2 on 2/19  Cont to monitor 10. Neurogenic bladder  Monitor with I/O caths PRN  PVRs improving  timed voiding attempts  Trial of bethanechol 5 TID, increased to 10 on 2/16, increased to 25 on 2/19, continues to improve, cont meds, will wean as outpt 11. Abd muscle spasms  Baclofen 5 TID trail 2/11, increased to 10 on 2/12, d/ced per husband on 2/13 12. Acute lower UTI  UA+, Ucx Klebsiella Oxytoca  Macrobid completed 2/8-2/14 13. Zoster flare  Localized, non-painful  ID aspirated fluid, resulting pending  Valtrex 59m TID x7 days per ID   LOS (Days) 17 A FACE TO FACE EVALUATION WAS PERFORMED  Ankit Lorie Phenix 10/25/2016 12:48 PM

## 2016-10-25 NOTE — Progress Notes (Signed)
Physical Therapy Session Note  Patient Details  Name: Katelyn Lamb MRN: [id number] Date of Birth: [birth year]  Today's Date: 10/25/2016 PT Individual Time: 0830-0930 PT Individual Time Calculation (min): 60 min    Skilled Therapeutic Interventions/Progress Updates:    Session focused on d/c planning, grad day activities, and neuro re-ed for postural control and balance re-training. Pt able to complete bed mobility on flat bed without rails during session at supervision level with cues for technique and encouragement. Instructed in simulated car transfer, gait up/dow ramp, gait over uneven mulched surface (mod assist for LOB x 1), gait on unit with focus on heel strike and postural control, and stair negotiation (min to mod assist needed for balance and cues for foot placement). Pt required overall min assist for transfers and gait this session with HHA for balance and cues for safety and slowing movement during turns. Emotional support provided throughout session.   Therapy Documentation Precautions:  Precautions Precautions: Fall Restrictions Weight Bearing Restrictions: No  Pain: Premedicated for pain. Pt reports it is all over.  See Function Navigator for Current Functional Status.   Therapy/Group: Individual Therapy  [name], PT, DPT  10/25/2016, 11:33 AM

## 2016-10-25 NOTE — Progress Notes (Signed)
Physical Therapy Discharge Summary  Patient Details  Name: Katelyn Lamb MRN: [id number] Date of Birth: 1945-11-22  Today's Date: 10/25/2016 PT Individual Time: 1445-1530 PT Individual Time Calculation (min): 45 min  Reports ongoing back pain - premedicated. Reviewed mobility including bed mobility, transfers, short distance gait, and toileting needs with husband, [name] providing assist. He was able to provide appropriate amount of assist with intermittent cues for body mechanics. Overall min physical assist provided throughout session. Neuro re-ed on Nustep to address reciprocal movement pattern re-training, coordination, and strengthening x 8 min on level 5 with BUE/BLE using UE adaptive pieces for closed chain activity. Dynamic standing balance addressed at sink and during clothing management to address postural control.   Patient has met 7 of 7 long term goals due to improved activity tolerance, improved balance, improved postural control, ability to compensate for deficits, improved awareness and improved coordination.  Patient to discharge at an ambulatory level min to mod assist.   Patient's care partner is independent to provide the necessary physical assistance at discharge and successfully completed family education.  Reasons goals not met: n/a - all goals met at this time  Recommendation:  Patient will benefit from ongoing skilled PT services in home health setting to continue to advance safe functional mobility, address ongoing impairments in gait, balance, endurance, coordination, strength, postural control, and minimize fall risk.  Equipment: No equipment provided. Pt declined w/c for community use.  Reasons for discharge: treatment goals met and discharge from hospital  Patient/family agrees with progress made and goals achieved: Yes  PT Discharge Precautions/Restrictions Precautions Precautions: Fall Restrictions Weight Bearing Restrictions: No Cognition Orientation Level:  Oriented X4 Sensation Sensation Light Touch: Impaired Detail Light Touch Impaired Details: Impaired RUE;Impaired LUE;Impaired RLE;Impaired LLE Proprioception Impaired Details: Impaired RUE;Impaired LUE;Impaired RLE;Impaired LLE Additional Comments: B UEs with greater impairment Coordination Gross Motor Movements are Fluid and Coordinated: No Fine Motor Movements are Fluid and Coordinated: No Motor  Motor Motor: Ataxia;Abnormal postural alignment and control Motor - Discharge Observations: BUE greater impairment than BLE     Trunk/Postural Assessment  Cervical Assessment Cervical Assessment: Within Functional Limits Thoracic Assessment Thoracic Assessment: Within Functional Limits (mild kyphosis) Lumbar Assessment Lumbar Assessment: Within Functional Limits Postural Control Postural Control: Deficits on evaluation Trunk Control: posterior bias though improved since admission  Balance Balance Balance Assessed: Yes Dynamic Sitting Balance Dynamic Sitting - Level of Assistance: 5: Stand by assistance Static Standing Balance Static Standing - Level of Assistance: 4: Min assist Dynamic Standing Balance Dynamic Standing - Level of Assistance: 4: Min assist Extremity Assessment   See OT d/c summary for UE details.    RLE Assessment RLE Assessment: Exceptions to [facility] (grossly WFL; decreased coordination) LLE Assessment LLE Assessment: Exceptions to [facility] (grossly WFL; decreased coordination)   See Function Navigator for Current Functional Status.  [name], PT, DPT  10/25/2016, 3:59 PM

## 2016-10-26 DIAGNOSIS — I952 Hypotension due to drugs: Secondary | ICD-10-CM

## 2016-10-26 DIAGNOSIS — I959 Hypotension, unspecified: Secondary | ICD-10-CM

## 2016-10-26 MED ORDER — RIVAROXABAN 15 MG PO TABS: ORAL_TABLET | ORAL | 0 refills | Status: DC

## 2016-10-26 MED ORDER — OMEGA-3-ACID ETHYL ESTERS 1 G PO CAPS: 1.0000 g | ORAL_CAPSULE | Freq: Two times a day (BID) | ORAL | 0 refills | Status: DC

## 2016-10-26 MED ORDER — RIVAROXABAN 20 MG PO TABS: ORAL_TABLET | ORAL | 1 refills | Status: DC

## 2016-10-26 MED ORDER — HYDROCODONE-ACETAMINOPHEN 10-325 MG PO TABS: 1.0000 | ORAL_TABLET | ORAL | 0 refills | Status: DC | PRN

## 2016-10-26 MED ORDER — VALACYCLOVIR HCL 1 G PO TABS: 1000.0000 mg | ORAL_TABLET | Freq: Three times a day (TID) | ORAL | 0 refills | Status: DC

## 2016-10-26 MED ORDER — ALPRAZOLAM 1 MG PO TABS: 1.0000 mg | ORAL_TABLET | Freq: Every evening | ORAL | 0 refills | Status: DC | PRN

## 2016-10-26 MED ORDER — POLYETHYLENE GLYCOL 3350 17 G PO PACK: 17.0000 g | PACK | Freq: Every day | ORAL | 0 refills | Status: DC

## 2016-10-26 MED ORDER — DIAZEPAM 5 MG PO TABS: 5.0000 mg | ORAL_TABLET | Freq: Two times a day (BID) | ORAL | 0 refills | Status: DC

## 2016-10-26 MED ORDER — BETHANECHOL CHLORIDE 25 MG PO TABS: ORAL_TABLET | ORAL | 0 refills | Status: DC

## 2016-10-26 MED ORDER — PANTOPRAZOLE SODIUM 40 MG PO TBEC: 40.0000 mg | DELAYED_RELEASE_TABLET | Freq: Every day | ORAL | 1 refills | Status: AC

## 2016-10-26 MED ORDER — BETHANECHOL CHLORIDE 25 MG PO TABS: 12.5000 mg | ORAL_TABLET | Freq: Three times a day (TID) | ORAL | Status: DC

## 2016-10-26 MED ORDER — PROPRANOLOL HCL 10 MG PO TABS: 5.0000 mg | ORAL_TABLET | Freq: Three times a day (TID) | ORAL | 0 refills | Status: DC

## 2016-10-26 MED ORDER — GABAPENTIN 100 MG PO CAPS: 200.0000 mg | ORAL_CAPSULE | Freq: Three times a day (TID) | ORAL | 1 refills | Status: DC

## 2016-10-26 NOTE — Progress Notes (Signed)
[facility] PHYSICAL MEDICINE & REHABILITATION     PROGRESS NOTE  Subjective/Complaints:  Pt seen laying in bed this AM.  She is anxious about discharge.  She states her shoulder is sore from yesterday.  She also notes burning in her feet and hips at times when she initially wakes up.    ROS: Denies nausea, vomiting, diarrhea, shortness of breath or chest pain  Objective: Vital Signs: Blood pressure (!) 80/62, pulse 74, temperature 98.7 F (37.1 C), temperature source Oral, resp. rate 16, weight 63 kg (138 lb 14.4 oz), SpO2 98 %. No results found. No results for input(s): WBC, HGB, HCT, PLT in the last 72 hours. No results for input(s): NA, K, CL, GLUCOSE, BUN, CREATININE, CALCIUM in the last 72 hours.  Invalid input(s): CO CBG (last 3)  No results for input(s): GLUCAP in the last 72 hours.  Wt Readings from Last 3 Encounters:  10/24/16 63 kg (138 lb 14.4 oz)  10/02/16 63.5 kg (140 lb)  09/20/16 65 kg (143 lb 3.2 oz)    Physical Exam:  BP (!) 80/62   Pulse 74   Temp 98.7 F (37.1 C) (Oral)   Resp 16   Wt 63 kg (138 lb 14.4 oz)   SpO2 98%   BMI 22.42 kg/m  Constitutional: She appears well-developed. Frail  HENT: Normocephalic and atraumatic.  Eyes: EOMI. No discharge.  Cardiovascular: RRR. No JVD. Respiratory: Normal effort. Clear GI: Soft. Bowel sounds are normal.  Musculoskeletal: She exhibits no edema or tenderness, including feet, hips upon palpation or ROM. Atrophy b/l hands. Neurological: She is alert and oriented.  Sensation diminished to light touch distal to neck  Bilateral ataxia. Motor: B/l UE: 4-/5 proximal to distal (Right stronger than left, unchanged). B/l LE: 4/5 proximal to distal (unchanged) Skin: Left arm lesion with open blister, healing.  Psychiatric: Somewhat flat. Delayed processing. Anxious.  Assessment/Plan: 1. Functional deficits secondary to incomplete paraplegia which require 3+ hours per day of interdisciplinary therapy in a comprehensive  inpatient rehab setting. Physiatrist is providing close team supervision and 24 hour management of active medical problems listed below. Physiatrist and rehab team continue to assess barriers to discharge/monitor patient progress toward functional and medical goals.  Function:  Bathing Bathing position   Position: Shower (per staff report)  Bathing parts Body parts bathed by patient: Right arm, Left arm, Chest, Abdomen, Front perineal area, Right upper leg, Left upper leg, Right lower leg, Left lower leg, Buttocks Body parts bathed by helper: Back  Bathing assist Assist Level: Assistive device, Touching or steadying assistance(Pt > 75%) Assistive Device Comment: bath mitt    Upper Body Dressing/Undressing Upper body dressing   What is the patient wearing?: Pull over shirt/dress (per staff report)     Pull over shirt/dress - Perfomed by patient: Thread/unthread right sleeve, Pull shirt over trunk, Put head through opening Pull over shirt/dress - Perfomed by helper: Thread/unthread left sleeve        Upper body assist Assist Level: Touching or steadying assistance(Pt > 75%)      Lower Body Dressing/Undressing Lower body dressing   What is the patient wearing?: Pants (per staff report) Underwear - Performed by patient: Thread/unthread right underwear leg, Thread/unthread left underwear leg Underwear - Performed by helper: Pull underwear up/down Pants- Performed by patient: Thread/unthread right pants leg, Thread/unthread left pants leg Pants- Performed by helper: Pull pants up/down   Non-skid slipper socks- Performed by helper: Don/doff right sock, Don/doff left sock  Shoes - Performed by helper: Don/doff right shoe, Don/doff left shoe, Fasten right, Fasten left       TED Hose - Performed by helper: Don/doff right TED hose, Don/doff left TED hose  Lower body assist Assist for lower body dressing:  (mod a)      Toileting Toileting   Toileting steps completed by  patient: Adjust clothing prior to toileting, Performs perineal hygiene Toileting steps completed by helper: Adjust clothing after toileting Toileting Assistive Devices: Grab bar or rail  Toileting assist Assist level:  (mod a)   Transfers Chair/bed transfer   Chair/bed transfer method: Ambulatory Chair/bed transfer assist level: Touching or steadying assistance (Pt > 75%) Chair/bed transfer assistive device: Armrests     Locomotion Ambulation     Max distance: 200' Assist level: Touching or steadying assistance (Pt > 75%)   Wheelchair   Type: Manual Max wheelchair distance: 120' (BLE) Assist Level: Touching or steadying assistance (Pt > 75%)  Cognition Comprehension Comprehension assist level: Understands complex 90% of the time/cues 10% of the time  Expression Expression assist level: Expresses complex 90% of the time/cues < 10% of the time  Social Interaction Social Interaction assist level: Interacts appropriately 90% of the time - Needs monitoring or encouragement for participation or interaction.  Problem Solving Problem solving assist level: Solves complex 90% of the time/cues < 10% of the time  Memory Memory assist level: Recognizes or recalls 90% of the time/requires cueing < 10% of the time    Medical Problem List and Plan: 1.  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   Completed Zovirax as directed per infectious disease. Spoke to ID per insistence of husband regarding repeat MRI - no indication at this time.   Prednisone d/ced  Propranolol d/ced due to low BP  D/c today.  Will see patient for transitional care management in 1-2 weeks.  2.  DVT Prophylaxis/Anticoagulation: Monitor platelet counts and any signs of bleeding.    Vascular study showing acute LLE DVT, started Xarelto.  3. Pain Management:   Neurontin 100 mg 3 times a day, increased to 200 TID.  Spoke with pt and discussed concerns, including husband's  concern of lethargy.  She states she would like to increase the dose as she believes this works well for her.   Valium 5 mg every 4 hours as needed muscle spasms, hydrocodone as needed  Prozaac started 2/16, d/ced 2/20, per patient 4. Mood: Xanax 1 mg daily as needed  Propranolol 5 TID started 2/16, d/ced 2/23 due to BP  Remains anxious  -team to provide positive reinforcement 5. Neuropsych: This patient is capable of making decisions on her own behalf.  Appreciate Neuropsych follow up 6. Skin/Wound Care: Routine skin checks 7. Fluids/Electrolytes/Nutrition: Routine I&Os  BMP within acceptable range on 2/20 8. GERD. Protonix 9. Constipation. Laxative assistance.  Increased bowel reg on 2/9  Improving 9. ABLA: Resolved  Hb 13.2 on 2/19  Cont to monitor 10. Neurogenic bladder  Monitor with I/O caths PRN  PVRs improving  timed voiding attempts  Trial of bethanechol 5 TID, increased to 10 on 2/16, increased to 25 on 2/19, decreased to 12.5 on 2/23 due to BP, will cont to wean as outpt 11. Abd muscle spasms  Baclofen 5 TID trail 2/11, increased to 10 on 2/12, d/ced per husband on 2/13 12. Acute lower UTI  UA+, Ucx Klebsiella Oxytoca  Macrobid completed 2/8-2/14 13. Zoster flare  Localized, non-painful  ID aspirated fluid, resulting  pending  Valtrex 69m TID x7 days per ID 14. Hypotension  Will check BP again and Propranolol d/ced, bethanechol decreased if low.  LOS (Days) 18 A FACE TO FACE EVALUATION WAS PERFORMED  Brecken Walth Lorie Phenix 10/26/2016 9:44 AM

## 2016-10-26 NOTE — Progress Notes (Signed)
Social Work  Discharge Note  The overall goal for the admission was met for:   Discharge location: Yes - home with spouse able to provide 24/7 assistance  Length of Stay: Yes - 18 days  Discharge activity level: Yes  Home/community participation: Yes - min (some mod) assist overall  Services provided included: MD, RD, PT, OT, RN, TR, Pharmacy, [place]: Private Insurance: [employer]  Follow-up services arranged: Home Health: PT, OT via [facility], DME: tub bench - [facility] and Patient/Family has no preference for HH/DME agencies  Comments (or additional information):  Patient/Family verbalized understanding of follow-up arrangements: Yes  Individual responsible for coordination of the follow-up plan: pt  Confirmed correct DME delivered: Landan Fedie 10/26/2016    Felice Deem

## 2016-10-26 NOTE — Progress Notes (Signed)
Patient discharged home.  Left floor via wheelchair, escorted by nursing staff and family.  Patient and family verbalized understanding of discharge instructions as given by Marlowe Shores, PA.  All patient belongings sent with patient.  Appears to be in no immediate distress at this time.  Brita Romp, RN

## 2016-10-26 NOTE — Progress Notes (Signed)
Changed dressing on patients arm.  Upon finishing, RN noticed specimen bag with viral transport tube inside.  Patient and husband state it has been there for a few days.  They state the infectious disease MD was using it to culture the blister on her left arm.  They also state multiple vials were used.  Notified Marlowe Shores, PA of findings.  Called Infectious disease MD for clarification to whether or not the vial should be sent now or not.  She stated no, that the virus is likely dead at this point and that the test results would not change any course of action for the patient.  Disposed of vial and notified patient and family of conversation and they are adamant that the MD took other vials with her upon specimen collection.  Brita Romp, RN

## 2016-10-27 DIAGNOSIS — Z792 Long term (current) use of antibiotics: Secondary | ICD-10-CM | POA: Diagnosis not present

## 2016-10-27 DIAGNOSIS — F419 Anxiety disorder, unspecified: Secondary | ICD-10-CM | POA: Diagnosis not present

## 2016-10-27 DIAGNOSIS — Z5181 Encounter for therapeutic drug level monitoring: Secondary | ICD-10-CM | POA: Diagnosis not present

## 2016-10-27 DIAGNOSIS — M549 Dorsalgia, unspecified: Secondary | ICD-10-CM | POA: Diagnosis not present

## 2016-10-27 DIAGNOSIS — Z452 Encounter for adjustment and management of vascular access device: Secondary | ICD-10-CM | POA: Diagnosis not present

## 2016-10-27 DIAGNOSIS — B029 Zoster without complications: Secondary | ICD-10-CM | POA: Diagnosis not present

## 2016-10-27 DIAGNOSIS — E871 Hypo-osmolality and hyponatremia: Secondary | ICD-10-CM | POA: Diagnosis not present

## 2016-10-27 DIAGNOSIS — R32 Unspecified urinary incontinence: Secondary | ICD-10-CM | POA: Diagnosis not present

## 2016-10-27 DIAGNOSIS — G959 Disease of spinal cord, unspecified: Secondary | ICD-10-CM | POA: Diagnosis not present

## 2016-10-27 DIAGNOSIS — R202 Paresthesia of skin: Secondary | ICD-10-CM | POA: Diagnosis not present

## 2016-11-01 ENCOUNTER — Telehealth: Payer: Self-pay | Admitting: *Deleted

## 2016-11-01 DIAGNOSIS — N390 Urinary tract infection, site not specified: Secondary | ICD-10-CM | POA: Diagnosis not present

## 2016-11-01 DIAGNOSIS — R300 Dysuria: Secondary | ICD-10-CM | POA: Diagnosis not present

## 2016-11-01 NOTE — Telephone Encounter (Signed)
I spoke with Katelyn Lamb and gave him the appt time 11/09/16 @3 :00 arrive by 2:45 and reviewed address.

## 2016-11-02 DIAGNOSIS — Z7901 Long term (current) use of anticoagulants: Secondary | ICD-10-CM | POA: Diagnosis not present

## 2016-11-02 DIAGNOSIS — B029 Zoster without complications: Secondary | ICD-10-CM | POA: Diagnosis not present

## 2016-11-02 DIAGNOSIS — B0082 Herpes simplex myelitis: Secondary | ICD-10-CM | POA: Diagnosis not present

## 2016-11-02 DIAGNOSIS — E871 Hypo-osmolality and hyponatremia: Secondary | ICD-10-CM | POA: Diagnosis not present

## 2016-11-02 DIAGNOSIS — Z452 Encounter for adjustment and management of vascular access device: Secondary | ICD-10-CM | POA: Diagnosis not present

## 2016-11-02 DIAGNOSIS — Z5181 Encounter for therapeutic drug level monitoring: Secondary | ICD-10-CM | POA: Diagnosis not present

## 2016-11-02 DIAGNOSIS — R202 Paresthesia of skin: Secondary | ICD-10-CM | POA: Diagnosis not present

## 2016-11-02 DIAGNOSIS — R32 Unspecified urinary incontinence: Secondary | ICD-10-CM | POA: Diagnosis not present

## 2016-11-02 DIAGNOSIS — I82402 Acute embolism and thrombosis of unspecified deep veins of left lower extremity: Secondary | ICD-10-CM | POA: Diagnosis not present

## 2016-11-02 DIAGNOSIS — G8222 Paraplegia, incomplete: Secondary | ICD-10-CM | POA: Diagnosis not present

## 2016-11-02 DIAGNOSIS — G959 Disease of spinal cord, unspecified: Secondary | ICD-10-CM | POA: Diagnosis not present

## 2016-11-02 DIAGNOSIS — F419 Anxiety disorder, unspecified: Secondary | ICD-10-CM | POA: Diagnosis not present

## 2016-11-02 DIAGNOSIS — N319 Neuromuscular dysfunction of bladder, unspecified: Secondary | ICD-10-CM | POA: Diagnosis not present

## 2016-11-02 DIAGNOSIS — M549 Dorsalgia, unspecified: Secondary | ICD-10-CM | POA: Diagnosis not present

## 2016-11-02 DIAGNOSIS — Z792 Long term (current) use of antibiotics: Secondary | ICD-10-CM | POA: Diagnosis not present

## 2016-11-06 ENCOUNTER — Ambulatory Visit
Admission: RE | Admit: 2016-11-06 | Discharge: 2016-11-06 | Disposition: A | Discharge status: Home / Self Care | Payer: PPO | Source: Ambulatory Visit | Attending: Family Medicine | Admitting: Family Medicine

## 2016-11-06 ENCOUNTER — Other Ambulatory Visit: Payer: Self-pay | Admitting: Family Medicine

## 2016-11-06 DIAGNOSIS — M25512 Pain in left shoulder: Secondary | ICD-10-CM

## 2016-11-06 DIAGNOSIS — G8222 Paraplegia, incomplete: Secondary | ICD-10-CM | POA: Diagnosis not present

## 2016-11-06 DIAGNOSIS — I825Z2 Chronic embolism and thrombosis of unspecified deep veins of left distal lower extremity: Secondary | ICD-10-CM | POA: Diagnosis not present

## 2016-11-06 DIAGNOSIS — G0491 Myelitis, unspecified: Secondary | ICD-10-CM | POA: Diagnosis not present

## 2016-11-07 DIAGNOSIS — M549 Dorsalgia, unspecified: Secondary | ICD-10-CM | POA: Diagnosis not present

## 2016-11-07 DIAGNOSIS — Z5181 Encounter for therapeutic drug level monitoring: Secondary | ICD-10-CM | POA: Diagnosis not present

## 2016-11-07 DIAGNOSIS — R202 Paresthesia of skin: Secondary | ICD-10-CM | POA: Diagnosis not present

## 2016-11-07 DIAGNOSIS — E871 Hypo-osmolality and hyponatremia: Secondary | ICD-10-CM | POA: Diagnosis not present

## 2016-11-07 DIAGNOSIS — Z792 Long term (current) use of antibiotics: Secondary | ICD-10-CM | POA: Diagnosis not present

## 2016-11-07 DIAGNOSIS — G959 Disease of spinal cord, unspecified: Secondary | ICD-10-CM | POA: Diagnosis not present

## 2016-11-07 DIAGNOSIS — B029 Zoster without complications: Secondary | ICD-10-CM | POA: Diagnosis not present

## 2016-11-07 DIAGNOSIS — Z452 Encounter for adjustment and management of vascular access device: Secondary | ICD-10-CM | POA: Diagnosis not present

## 2016-11-07 DIAGNOSIS — R32 Unspecified urinary incontinence: Secondary | ICD-10-CM | POA: Diagnosis not present

## 2016-11-07 DIAGNOSIS — F419 Anxiety disorder, unspecified: Secondary | ICD-10-CM | POA: Diagnosis not present

## 2016-11-08 DIAGNOSIS — Z452 Encounter for adjustment and management of vascular access device: Secondary | ICD-10-CM | POA: Diagnosis not present

## 2016-11-08 DIAGNOSIS — G959 Disease of spinal cord, unspecified: Secondary | ICD-10-CM | POA: Diagnosis not present

## 2016-11-08 DIAGNOSIS — B029 Zoster without complications: Secondary | ICD-10-CM | POA: Diagnosis not present

## 2016-11-08 DIAGNOSIS — Z5181 Encounter for therapeutic drug level monitoring: Secondary | ICD-10-CM | POA: Diagnosis not present

## 2016-11-08 DIAGNOSIS — M549 Dorsalgia, unspecified: Secondary | ICD-10-CM | POA: Diagnosis not present

## 2016-11-08 DIAGNOSIS — E871 Hypo-osmolality and hyponatremia: Secondary | ICD-10-CM | POA: Diagnosis not present

## 2016-11-08 DIAGNOSIS — R32 Unspecified urinary incontinence: Secondary | ICD-10-CM | POA: Diagnosis not present

## 2016-11-08 DIAGNOSIS — Z792 Long term (current) use of antibiotics: Secondary | ICD-10-CM | POA: Diagnosis not present

## 2016-11-08 DIAGNOSIS — F419 Anxiety disorder, unspecified: Secondary | ICD-10-CM | POA: Diagnosis not present

## 2016-11-08 DIAGNOSIS — R202 Paresthesia of skin: Secondary | ICD-10-CM | POA: Diagnosis not present

## 2016-11-09 ENCOUNTER — Encounter: Payer: PPO | Attending: Physical Medicine & Rehabilitation | Admitting: Physical Medicine & Rehabilitation

## 2016-11-09 ENCOUNTER — Encounter: Payer: Self-pay | Admitting: Physical Medicine & Rehabilitation

## 2016-11-09 VITALS — BP 103/65 | HR 83

## 2016-11-09 DIAGNOSIS — Z7902 Long term (current) use of antithrombotics/antiplatelets: Secondary | ICD-10-CM | POA: Diagnosis not present

## 2016-11-09 DIAGNOSIS — F419 Anxiety disorder, unspecified: Secondary | ICD-10-CM | POA: Insufficient documentation

## 2016-11-09 DIAGNOSIS — G8222 Paraplegia, incomplete: Secondary | ICD-10-CM | POA: Insufficient documentation

## 2016-11-09 DIAGNOSIS — R270 Ataxia, unspecified: Secondary | ICD-10-CM | POA: Insufficient documentation

## 2016-11-09 DIAGNOSIS — B0082 Herpes simplex myelitis: Secondary | ICD-10-CM | POA: Diagnosis not present

## 2016-11-09 DIAGNOSIS — I82402 Acute embolism and thrombosis of unspecified deep veins of left lower extremity: Secondary | ICD-10-CM | POA: Diagnosis not present

## 2016-11-09 DIAGNOSIS — Z5181 Encounter for therapeutic drug level monitoring: Secondary | ICD-10-CM

## 2016-11-09 DIAGNOSIS — M792 Neuralgia and neuritis, unspecified: Secondary | ICD-10-CM | POA: Diagnosis not present

## 2016-11-09 DIAGNOSIS — G8929 Other chronic pain: Secondary | ICD-10-CM | POA: Diagnosis not present

## 2016-11-09 DIAGNOSIS — Z8249 Family history of ischemic heart disease and other diseases of the circulatory system: Secondary | ICD-10-CM | POA: Diagnosis not present

## 2016-11-09 DIAGNOSIS — Z79899 Other long term (current) drug therapy: Secondary | ICD-10-CM | POA: Diagnosis not present

## 2016-11-09 DIAGNOSIS — N319 Neuromuscular dysfunction of bladder, unspecified: Secondary | ICD-10-CM | POA: Insufficient documentation

## 2016-11-09 DIAGNOSIS — Z90710 Acquired absence of both cervix and uterus: Secondary | ICD-10-CM | POA: Insufficient documentation

## 2016-11-09 DIAGNOSIS — I82432 Acute embolism and thrombosis of left popliteal vein: Secondary | ICD-10-CM

## 2016-11-09 DIAGNOSIS — B029 Zoster without complications: Secondary | ICD-10-CM

## 2016-11-09 DIAGNOSIS — Z87891 Personal history of nicotine dependence: Secondary | ICD-10-CM | POA: Diagnosis not present

## 2016-11-09 DIAGNOSIS — R339 Retention of urine, unspecified: Secondary | ICD-10-CM

## 2016-11-09 DIAGNOSIS — M549 Dorsalgia, unspecified: Secondary | ICD-10-CM | POA: Insufficient documentation

## 2016-11-09 MED ORDER — BACLOFEN 10 MG PO TABS: 5.0000 mg | ORAL_TABLET | Freq: Three times a day (TID) | ORAL | 1 refills | Status: DC

## 2016-11-09 MED ORDER — GABAPENTIN 300 MG PO CAPS: 300.0000 mg | ORAL_CAPSULE | Freq: Three times a day (TID) | ORAL | 1 refills | Status: DC

## 2016-11-09 NOTE — Addendum Note (Signed)
Addended by: Delice Lesch A on: 11/09/2016 04:03 PM   Modules accepted: Orders

## 2016-11-09 NOTE — Progress Notes (Signed)
Subjective:    Patient ID: Katelyn Lamb, female    DOB: 02-06-1946, 71 y.o.   MRN: [id number]  HPI 71 year old right-handed female with history of anxiety, chronic back pain, presented for hospital follow up after receiving CIR for HSV 2 myelitis with incomplete paraplegia.    At discharge, she was instructed to follow up with ID, who stated that she did not need to be seen.  Husband provides majority of history. They do have an appointment with another ID doctor.  She sees Neurology next week.  She saw PCP earlier this week. She continues to take Xarelto.  Today, she complains of ataxia and generalized pain.  Her bowel issues have resolved.  She states her urinary retention has resolved, which she is in the process of weaning.    Therapies: 4/week. Mobility: Uses husband for support DME: Shower chair   Pain Inventory Average Pain 6 Pain Right Now 7 My pain is constant, burning, stabbing and tingling  In the last 24 hours, has pain interfered with the following? General activity 10 Relation with others 9 Enjoyment of life 10 What TIME of day is your pain at its worst? morning Sleep (in general) Good  Pain is worse with: walking, bending, sitting and inactivity Pain improves with: rest and medication Relief from Meds: 5  Mobility walk with assistance how many minutes can you walk? 2 ability to climb steps?  no do you drive?  no needs help with transfers Do you have any goals in this area?  yes  Function retired I need assistance with the following:  feeding, dressing, bathing, toileting, meal prep and household duties  Neuro/Psych weakness numbness tremor tingling trouble walking  Prior Studies Any changes since last visit?  no  Physicians involved in your care Any changes since last visit?  no   Family History  Problem Relation Age of Onset  . Hypertension Mother    Social History   Social History  . Marital status: Married    Spouse name: N/A  .  Number of children: 2  . Years of education: N/A   Social History Main Topics  . Smoking status: Former Research scientist (life sciences)  . Smokeless tobacco: Never Used  . Alcohol use No  . Drug use: No  . Sexual activity: Not Asked   Other Topics Concern  . None   Social History Narrative  . None   Past Surgical History:  Procedure Laterality Date  . ABDOMINAL HYSTERECTOMY    . BLADDER REPAIR    . CESAREAN SECTION    . TUBAL LIGATION     Past Medical History:  Diagnosis Date  . Anxiety   . Back pain   . Myelitis due to herpes simplex (HCC)    BP 103/65   Pulse 83   SpO2 94%   Opioid Risk Score:   Fall Risk Score:  `1  Depression screen PHQ 2/9  Depression screen PHQ 2/9 11/09/2016  Decreased Interest 0  Down, Depressed, Hopeless 0  PHQ - 2 Score 0  Altered sleeping 0  Tired, decreased energy 2  Change in appetite 0  Feeling bad or failure about yourself  1  Trouble concentrating 0  Moving slowly or fidgety/restless 0  Suicidal thoughts 0  PHQ-9 Score 3    Review of Systems  HENT: Negative.   Eyes: Negative.   Respiratory: Negative.   Cardiovascular: Negative.   Gastrointestinal: Negative.   Endocrine: Negative.   Genitourinary: Negative.   Musculoskeletal: Positive for back pain,  gait problem and neck pain.  Skin: Negative.   Allergic/Immunologic: Negative.   Neurological: Positive for syncope, weakness and numbness.  Hematological: Negative.   Psychiatric/Behavioral: Negative.       Objective:   Physical Exam Constitutional: She appears well-developed. Frail  HENT: Normocephalic and atraumatic.  Eyes: EOMI. No discharge.  Cardiovascular: RRR. No JVD. Respiratory: Normal effort. Clear GI: Soft. Bowel sounds are normal.  Musculoskeletal: She exhibits no edema or tenderness Atrophy b/l hands. Neurological: She is alert and oriented.  Sensation diminished to light touch distal to neck  Bilateral ataxia. Motor: B/l UE: 4-/5 proximal to distal (Right stronger than  left, unchanged). B/l LE: 4+/5 proximal to distal  Skin: Left arm lesion with blister.  Psychiatric: Somewhat flat. Delayed processing. Anxious.    Assessment & Plan:  71 year old right-handed female with history of anxiety, chronic back pain, presented for hospital follow up after receiving CIR for HSV 2 myelitis with incomplete paraplegia.   1.  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   Follow up with ID  Valium changed to Baclofen 5 TID 2. Ataxia  Cont wrist weights  Will trial Propranolol, will have to watch BP with med  3. LLE DVT  Cont Xarelto.   4. Chronic pain with neuropathic pain  Receives [place] from Dr. Nelva Bush  Will increase Gabapentin 300 TID  5. Anxiety  Cont   6. Neurogenic bladder  Cont to wean bethanechol and then d/c  7. Zoster flare  Cont to follow with ID  >45 minutes spent with pt and husband with >35 minutes in educating and counseling regarding medications, neurogenic bladder, ataxia

## 2016-11-13 DIAGNOSIS — Z5181 Encounter for therapeutic drug level monitoring: Secondary | ICD-10-CM | POA: Diagnosis not present

## 2016-11-13 DIAGNOSIS — F419 Anxiety disorder, unspecified: Secondary | ICD-10-CM | POA: Diagnosis not present

## 2016-11-13 DIAGNOSIS — Z452 Encounter for adjustment and management of vascular access device: Secondary | ICD-10-CM | POA: Diagnosis not present

## 2016-11-13 DIAGNOSIS — E871 Hypo-osmolality and hyponatremia: Secondary | ICD-10-CM | POA: Diagnosis not present

## 2016-11-13 DIAGNOSIS — R202 Paresthesia of skin: Secondary | ICD-10-CM | POA: Diagnosis not present

## 2016-11-13 DIAGNOSIS — B029 Zoster without complications: Secondary | ICD-10-CM | POA: Diagnosis not present

## 2016-11-13 DIAGNOSIS — G959 Disease of spinal cord, unspecified: Secondary | ICD-10-CM | POA: Diagnosis not present

## 2016-11-13 DIAGNOSIS — R32 Unspecified urinary incontinence: Secondary | ICD-10-CM | POA: Diagnosis not present

## 2016-11-13 DIAGNOSIS — Z792 Long term (current) use of antibiotics: Secondary | ICD-10-CM | POA: Diagnosis not present

## 2016-11-13 DIAGNOSIS — M549 Dorsalgia, unspecified: Secondary | ICD-10-CM | POA: Diagnosis not present

## 2016-11-14 ENCOUNTER — Encounter: Payer: Self-pay | Admitting: Neurology

## 2016-11-14 ENCOUNTER — Ambulatory Visit (INDEPENDENT_AMBULATORY_CARE_PROVIDER_SITE_OTHER): Payer: PPO | Admitting: Neurology

## 2016-11-14 VITALS — BP 90/65 | HR 90 | Ht 66.0 in | Wt 141.5 lb

## 2016-11-14 DIAGNOSIS — G0490 Encephalitis and encephalomyelitis, unspecified: Secondary | ICD-10-CM

## 2016-11-14 DIAGNOSIS — R269 Unspecified abnormalities of gait and mobility: Secondary | ICD-10-CM | POA: Diagnosis not present

## 2016-11-14 DIAGNOSIS — R270 Ataxia, unspecified: Secondary | ICD-10-CM

## 2016-11-14 HISTORY — DX: Unspecified abnormalities of gait and mobility: R26.9

## 2016-11-14 MED ORDER — DULOXETINE HCL 30 MG PO CPEP: 30.0000 mg | ORAL_CAPSULE | Freq: Two times a day (BID) | ORAL | 3 refills | Status: DC

## 2016-11-14 NOTE — Patient Instructions (Signed)
   With the Cymbalta 30 mg take one a day for one week, then take one twice a day.

## 2016-11-14 NOTE — Progress Notes (Signed)
Reason for visit: Encephalomyelitis  Referring physician: Dr. Sherral Hammers Katelyn Lamb is a 71 y.o. female  History of present illness:  Katelyn Lamb is a 71 year old right-handed white female with a history of chronic low back pain. The patient began having new symptoms toward the mid to latter part of December 2017 associated with some discomfort on the left side the back that would go around to the front of the abdomen with a burning sensation. Eventually, the patient began having some numbness around the left knee and then developed numbness on both legs. The patient developed a rash around the left knee area as well. The patient had difficulty walking towards the beginning of January 2018. The patient was seen for an evaluation through her orthopedic surgeon, MRI of the low back did not show an etiology of her symptoms. The patient was referred to this office, but she ended up in the emergency room before that time and was admitted for evaluation. The patient at one point had some difficulty with urinary incontinence. She developed in the hospital problems with numbness and weakness of the arms and hands which has persisted. She has a significant gait disorder. The patient has undergone rehabilitation in the hospital, she continues as an outpatient. She has regained bladder function, but she has developed a severe pain syndrome that affects the arms and legs and she has a squeezing sensation around the chest level and in the mid thigh level bilaterally. The patient has not had any falls, but she needs assistance from her husband to ambulate, she has minimal use of the hands currently. She requires assistance with bathing, dressing, and feeding herself. MRI of the brain, cervical spine, and thoracic spine shows evidence of involvement of the mesial portions of the frontal lobes bilaterally, with severe involvement of the cervical spinal cord and diffuse involvement of the thoracic spinal cord.  Particularly the C2-C4 levels were involved with enhancement, lumbar puncture indicated a type 2 herpes infection. The patient has not yet seen infectious disease. She was given a 21 day course of acyclovir, she is currently off this medication. She comes to this office for an evaluation. She has been placed on gabapentin taking 300 mg 3 times daily, but she continues to have severe pain. She was placed on baclofen 5 mg 3 times daily, but she could not tolerate this medication secondary to drowsiness.    Past Medical History:  Diagnosis Date  . Anxiety   . Back pain   . Gait abnormality 11/14/2016  . Myelitis due to herpes simplex [facility])     Past Surgical History:  Procedure Laterality Date  . ABDOMINAL HYSTERECTOMY    . BLADDER REPAIR    . CESAREAN SECTION    . TUBAL LIGATION      Family History  Problem Relation Age of Onset  . Hypertension Mother     Social history:  reports that she has quit smoking. She has never used smokeless tobacco. She reports that she does not drink alcohol or use drugs.  Medications:  Prior to Admission medications   Medication Sig Start Date End Date Taking? Authorizing Provider  ALPRAZolam Duanne Moron) 1 MG tablet Take 1 mg by mouth as needed. 10/26/16  Yes Historical Provider, MD  bethanechol (URECHOLINE) 25 MG tablet 25 mg 3 times a day 1 week then 25 mg twice a day 1 week then 25 mg daily 1 week and stop 10/26/16  Yes Daniel J Angiulli, PA-C  calcium carbonate (OS-CAL)  600 MG TABS tablet Take 600 mg by mouth 2 (two) times daily with a meal.   Yes Historical Provider, MD  cholecalciferol (VITAMIN D) 1000 units tablet Take 1,000 Units by mouth daily.   Yes Historical Provider, MD  diazepam (VALIUM) 5 MG tablet Take 5 mg by mouth 2 (two) times daily. 10/26/16  Yes Historical Provider, MD  gabapentin (NEURONTIN) 300 MG capsule Take 1 capsule (300 mg total) by mouth 3 (three) times daily. 11/09/16 12/09/16 Yes Ankit Lorie Phenix, MD  HYDROcodone-acetaminophen (NORCO)  10-325 MG tablet Take 1-2 tablets by mouth every 4 (four) hours as needed for moderate pain. 10/26/16  Yes Daniel J Angiulli, PA-C  omega-3 acid ethyl esters (LOVAZA) 1 g capsule Take 1 capsule (1 g total) by mouth 2 (two) times daily. 10/26/16  Yes Daniel J Angiulli, PA-C  pantoprazole (PROTONIX) 40 MG tablet Take 1 tablet (40 mg total) by mouth daily. 10/26/16  Yes Daniel J Angiulli, PA-C  polyethylene glycol (MIRALAX / GLYCOLAX) packet Take 17 g by mouth daily. Patient taking differently: Take 17 g by mouth daily as needed.  10/26/16  Yes Daniel J Angiulli, PA-C  rivaroxaban (XARELTO) 20 MG TABS tablet 20 mg tablet daily to begin 10/31/2016 10/26/16  Yes Daniel J Angiulli, PA-C  baclofen (LIORESAL) 10 MG tablet Take 0.5 tablets (5 mg total) by mouth 3 (three) times daily. Patient not taking: Reported on 11/14/2016 11/09/16   Ankit Lorie Phenix, MD  DULoxetine (CYMBALTA) 30 MG capsule Take 1 capsule (30 mg total) by mouth 2 (two) times daily. 11/14/16   [name], MD      Allergies  Allergen Reactions  . Demerol [Meperidine] Other (See Comments)    Hallucinations  . Amoxicillin-Pot Clavulanate Other (See Comments)  . Penicillins Other (See Comments)    ROS:  Out of a complete 14 system review of symptoms, the patient complains only of the following symptoms, and all other reviewed systems are negative.  Fevers  Gait instability, numbness, pain  Blood pressure 90/65, pulse 90, height 5\' 6"  (1.676 m), weight 141 lb 8 oz (64.2 kg), SpO2 93 %.  Physical Exam  General: The patient is alert and cooperative at the time of the examination.  Eyes: Pupils are equal, round, and reactive to light. Discs are flat bilaterally.  Neck: The neck is supple, no carotid bruits are noted.  Respiratory: The respiratory examination is clear.  Cardiovascular: The cardiovascular examination reveals a regular rate and rhythm, no obvious murmurs or rubs are noted.  Skin: Extremities are without  significant edema.  Neurologic Exam  Mental status: The patient is alert and oriented x 3 at the time of the examination. The patient has apparent normal recent and remote memory, with an apparently normal attention span and concentration ability.  Cranial nerves: Facial symmetry is present. There is good sensation of the face to pinprick and soft touch bilaterally. The strength of the facial muscles and the muscles to head turning and shoulder shrug are normal bilaterally. Speech is well enunciated, no aphasia or dysarthria is noted. Extraocular movements are full. Visual fields are full. The tongue is midline, and the patient has symmetric elevation of the soft palate. No obvious hearing deficits are noted.  Motor: The motor testing reveals 4/5 strength with intrinsic muscles hands, 4+/5 strength with the upper arms, and with the lower extremities with exception of a significant footdrop on the left. Good symmetric motor tone is noted throughout.  Sensory: Sensory testing is notable for a  decrease in pinprick sensation throughout the arms and legs, operations sensation is moderately impaired on all 4 extremities with significant impairment of position sense on all 4 extremities. No evidence of extinction was noted.   Coordination: Cerebellar testing reveals a sensory ataxia of all 4 extremities, most prominent with the left upper extremity.   Gait and station: Gait is is wide-based, ataxic, the patient requires slight assistance with walking. Tandem gait was not attempted.  No drift is seen.  Reflexes: Deep tendon reflexes are symmetric and normal bilaterally. Toes are downgoing bilaterally.   Assessment/Plan:   1. Encephalomyelitis, herpes 2  2. Sensory ataxia, gait disorder  3. Chronic pain syndrome  The patient is having significant sensory ataxia and chronic pain related to the spinal cord involvement with the herpes infection. The patient is on gabapentin, she is on a relatively low  dose but Katelyn fear that a significant increase in this medication may make her gait disturbance much more severe. The patient will be placed on Cymbalta working up to 30 mg twice daily, she is to contact me at that point if she can continue to increase the medication. The patient will stop the baclofen. She will follow-up in 3 months. She will continue physical and occupational therapy, she will be seeing infectious disease in the near future.  Jill Alexanders MD 11/14/2016 12:15 PM  [facility] [address] [place], [place] [address]  Phone [phone] Fax [phone]

## 2016-11-15 DIAGNOSIS — M549 Dorsalgia, unspecified: Secondary | ICD-10-CM | POA: Diagnosis not present

## 2016-11-15 DIAGNOSIS — R202 Paresthesia of skin: Secondary | ICD-10-CM | POA: Diagnosis not present

## 2016-11-15 DIAGNOSIS — B029 Zoster without complications: Secondary | ICD-10-CM | POA: Diagnosis not present

## 2016-11-15 DIAGNOSIS — Z5181 Encounter for therapeutic drug level monitoring: Secondary | ICD-10-CM | POA: Diagnosis not present

## 2016-11-15 DIAGNOSIS — E871 Hypo-osmolality and hyponatremia: Secondary | ICD-10-CM | POA: Diagnosis not present

## 2016-11-15 DIAGNOSIS — Z792 Long term (current) use of antibiotics: Secondary | ICD-10-CM | POA: Diagnosis not present

## 2016-11-15 DIAGNOSIS — G959 Disease of spinal cord, unspecified: Secondary | ICD-10-CM | POA: Diagnosis not present

## 2016-11-15 DIAGNOSIS — R32 Unspecified urinary incontinence: Secondary | ICD-10-CM | POA: Diagnosis not present

## 2016-11-15 DIAGNOSIS — Z452 Encounter for adjustment and management of vascular access device: Secondary | ICD-10-CM | POA: Diagnosis not present

## 2016-11-15 DIAGNOSIS — F419 Anxiety disorder, unspecified: Secondary | ICD-10-CM | POA: Diagnosis not present

## 2016-11-22 DIAGNOSIS — R32 Unspecified urinary incontinence: Secondary | ICD-10-CM | POA: Diagnosis not present

## 2016-11-22 DIAGNOSIS — E871 Hypo-osmolality and hyponatremia: Secondary | ICD-10-CM | POA: Diagnosis not present

## 2016-11-22 DIAGNOSIS — B029 Zoster without complications: Secondary | ICD-10-CM | POA: Diagnosis not present

## 2016-11-22 DIAGNOSIS — Z792 Long term (current) use of antibiotics: Secondary | ICD-10-CM | POA: Diagnosis not present

## 2016-11-22 DIAGNOSIS — F419 Anxiety disorder, unspecified: Secondary | ICD-10-CM | POA: Diagnosis not present

## 2016-11-22 DIAGNOSIS — M549 Dorsalgia, unspecified: Secondary | ICD-10-CM | POA: Diagnosis not present

## 2016-11-22 DIAGNOSIS — R202 Paresthesia of skin: Secondary | ICD-10-CM | POA: Diagnosis not present

## 2016-11-22 DIAGNOSIS — Z452 Encounter for adjustment and management of vascular access device: Secondary | ICD-10-CM | POA: Diagnosis not present

## 2016-11-22 DIAGNOSIS — Z5181 Encounter for therapeutic drug level monitoring: Secondary | ICD-10-CM | POA: Diagnosis not present

## 2016-11-22 DIAGNOSIS — G959 Disease of spinal cord, unspecified: Secondary | ICD-10-CM | POA: Diagnosis not present

## 2016-11-26 ENCOUNTER — Inpatient Hospital Stay: Payer: PPO | Admitting: Internal Medicine

## 2016-11-28 ENCOUNTER — Telehealth: Payer: Self-pay | Admitting: Neurology

## 2016-11-28 NOTE — Telephone Encounter (Signed)
Patients husband called in reference to DULoxetine (CYMBALTA) 30 MG capsule wanting to give an updated that patient is doing fine on the medication and both patient and husband feel the medication is helping.

## 2016-12-04 ENCOUNTER — Encounter: Payer: PPO | Admitting: Occupational Therapy

## 2016-12-04 ENCOUNTER — Ambulatory Visit: Payer: PPO | Admitting: Physical Therapy

## 2016-12-06 ENCOUNTER — Ambulatory Visit: Payer: PPO | Attending: Family Medicine

## 2016-12-06 ENCOUNTER — Telehealth: Payer: Self-pay | Admitting: Neurology

## 2016-12-06 ENCOUNTER — Ambulatory Visit: Payer: PPO | Admitting: Occupational Therapy

## 2016-12-06 DIAGNOSIS — R41844 Frontal lobe and executive function deficit: Secondary | ICD-10-CM | POA: Insufficient documentation

## 2016-12-06 DIAGNOSIS — R208 Other disturbances of skin sensation: Secondary | ICD-10-CM | POA: Diagnosis not present

## 2016-12-06 DIAGNOSIS — G8222 Paraplegia, incomplete: Secondary | ICD-10-CM | POA: Insufficient documentation

## 2016-12-06 DIAGNOSIS — R2681 Unsteadiness on feet: Secondary | ICD-10-CM | POA: Insufficient documentation

## 2016-12-06 DIAGNOSIS — R41841 Cognitive communication deficit: Secondary | ICD-10-CM | POA: Insufficient documentation

## 2016-12-06 DIAGNOSIS — R278 Other lack of coordination: Secondary | ICD-10-CM | POA: Diagnosis not present

## 2016-12-06 DIAGNOSIS — R29818 Other symptoms and signs involving the nervous system: Secondary | ICD-10-CM | POA: Insufficient documentation

## 2016-12-06 DIAGNOSIS — M6281 Muscle weakness (generalized): Secondary | ICD-10-CM

## 2016-12-06 DIAGNOSIS — R482 Apraxia: Secondary | ICD-10-CM | POA: Insufficient documentation

## 2016-12-06 DIAGNOSIS — R2689 Other abnormalities of gait and mobility: Secondary | ICD-10-CM | POA: Diagnosis not present

## 2016-12-06 NOTE — Therapy (Signed)
[facility] [address] [place], [name], [address] Phone: [phone]   Fax:  [phone]  Occupational Therapy Evaluation  Patient Details  Name: Katelyn Lamb MRN: [id number] Date of Birth: [birth year] Referring Provider: Carlos Levering PA-C  Encounter Date: 12/06/2016      OT End of Session - 12/06/16 1608    Visit Number 1   Number of Visits 17   Date for OT Re-Evaluation 02/04/17   Authorization Type [employer] - G code required   Authorization - Visit Number 1   Authorization - Number of Visits 10   OT Start Time 1350   OT Stop Time 1445   OT Time Calculation (min) 55 min   Activity Tolerance Patient tolerated treatment well      Past Medical History:  Diagnosis Date  . Anxiety   . Back pain   . Gait abnormality 11/14/2016  . 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 ABDOMINAL HYSTERECTOMY    . BLADDER REPAIR    . CESAREAN SECTION    . TUBAL LIGATION      There were no vitals filed for this visit.      Subjective Assessment - 12/06/16 1353    Patient is accompained by: Family member  husband   Pertinent History C3-4 incomplete paraplegia, HSV encephalitis and myelitis, anxiety   Limitations no driving, fall risk   Patient Stated Goals to be normal and get stronger   Currently in Pain? Yes   Pain Score --  fluctuates   Pain Location --  Generalized   Pain Type Neuropathic pain;Chronic pain  chronic in back   Pain Onset More than a month ago   Pain Frequency Constant   Aggravating Factors  movement   Pain Relieving Factors rest           [facility] OT Assessment - 12/06/16 0001      Assessment   Diagnosis C3-4 incomplete paraplegia   d/t Herpes simlex virus (HSV) w/ encephalitis and myelitis   Referring Provider Carlos Levering PA-C   Onset Date --  December 2017 - January 2018   Assessment Pt reports legs became  weaker and numb in January 2018. Then UE's became involved after hospitalization.    Prior Therapy inpatient rehab 10/08/16 - 10/26/16     Precautions   Precautions Fall  no driving, 24 hr. sup required     Balance Screen   Has the patient fallen in the past 6 months Yes   How many times? 1   Has the patient had a decrease in activity level because of a fear of falling?  --  P.T. to address further     Home  Environment   Bathroom Shower/Tub Tub/Shower unit;Curtain   Home Equipment Tub bench;Bedside commode;Walker - 4 wheels   Additional Comments Pt lives on 1st floor of 2 level home, with 2 steps to enter   Lives With Spouse     Prior Function   Level of [place] Retired     ADL   Eating/Feeding Moderate assistance  feeding self finger foods and some w/ utensils, husband help   Grooming --  assist with hair, able to brush teeth   Upper Body Bathing Modified independent   Lower Body Bathing Modified independent   Upper Body Dressing Moderate assistance   Lower Body Dressing Moderate assistance  inconsistent, assist with shoes/socks &  pulling up in back   Toilet Tranfer Minimal assistance   Toileting - Clothing Manipulation Minimal assistance   Toileting -  Hygiene Moderate assistance  for bowel movements   Tub/Shower Transfer Supervision/safety  with tub bench   ADL comments Pt not performing IADLS at this time. Husband has always done financial management     Mobility   Mobility Status Needs assist   Mobility Status Comments walking with assist     Written Expression   Dominant Hand Right   Handwriting --  unable - pt keeps dropping pen     Vision - History   Visual History Cataracts  cataract surgery both eyes Dec 2017   Additional Comments Pt reports decreased difficulty reading since cataract surgery     Cognition   Overall Cognitive Status Difficult to assess  denies change     Observation/Other Assessments   Observations Pt  appears to have motor skills to perform coordination tasks. Pt impaired with sensation including proprioception and kinesthesia, but question if this soley contributes to her coordination deficits or if perceptual deficits or apraxia plays a part as well.  Even when looking at her hand with a coordination task, pt constantly dropping items.     Sensation   Light Touch Impaired by gross assessment  inconsistent w/ detection & localizing   Proprioception Impaired by gross assessment   Additional Comments impaired kinestheisa     Coordination   9 Hole Peg Test Right;Left   Right 9 Hole Peg Test unable, pt could pick up occasionally but would drop   Left 9 Hole Peg Test placed 1 peg in 60 sec.   Box and Blocks Rt = 17, Lt = 10 lbs   Coordination Impaired by severly impaired sensation, ? apraxia and ataxia     Perception   Perception Impaired  decreased body awareness     ROM / Strength   AROM / PROM / Strength AROM     AROM   Overall AROM Comments RUE A/ROM WFL's. LUE A/ROM: WFL's however pt flexes elbow (? d/t weakness) but can extend with functional reaching without difficulty     Hand Function   Right Hand Grip (lbs) 23 lbs   Left Hand Grip (lbs) 15 lbs                         OT Education - 12/06/16 1605    Education provided Yes   Education Details Reviewed assessment and POC, functional safe tasks to perform at home   Person(s) Educated Patient;Spouse   Methods Explanation   Comprehension Verbalized understanding          OT Short Term Goals - 12/06/16 1614      OT SHORT TERM GOAL #1   Title Independent with initial HEP (Due 01/05/17)   Time 4   Period Weeks   Status New     OT SHORT TERM GOAL #2   Title Pt to feed self 75% of the time   Time 4   Period Weeks   Status New     OT SHORT TERM GOAL #3   Title Pt to dress self overall with min assist   Time 4   Period Weeks   Status New     OT SHORT TERM GOAL #4   Title Pt to write name with  Rt hand with 75% legibility with A/E prn   Time 4   Period Weeks   Status New  OT SHORT TERM GOAL #5   Title Improve bilateral UE function as evidenced by performing Box & Blocks with at least an 8 block improvement   Baseline eval: Rt = 17, Lt = 10    Time 4   Period Weeks   Status New     Additional Short Term Goals   Additional Short Term Goals Yes     OT SHORT TERM GOAL #6   Title Pt to verbalize understanding with safety considerations d/t lack of sensation bilaterally   Time 4   Period Weeks   Status New           OT Long Term Goals - 12/06/16 1617      OT LONG TERM GOAL #1   Title Independent with updated HEP (Due 02/04/17)   Time 8   Period Weeks   Status New     OT LONG TERM GOAL #2   Title Improve coordination as evidenced by performing 9 hole peg test bilaterally in 2 sec. or under   Time 8   Period Weeks   Status New     OT LONG TERM GOAL #3   Title Pt to perform simple snack/sandwich prep mod I level   Time 8   Period Weeks   Status New     OT LONG TERM GOAL #4   Title Pt to demo sufficient UE strength as demo by folding laundry for 10 minutes w/o rest   Time 8   Period Weeks   Status New     OT LONG TERM GOAL #5   Title Pt to improve grip strength by 10 lbs or greater from initial eval   Baseline eval: Rt = 23 lbs, Lt = 10 lbs   Time 8   Period Weeks   Status New               Plan - 12/06/16 1610    Clinical Impression Statement Pt is a 71 y.o. female who presents to outpatient rehab for O.T. evaluation with: incomplete paraplegia C3-4 d/t HSV with encephalitis and myelitis, and anxiety. Pt presents with decreased coordination, strength, sensation and pain which limits her ability to perform all ADLS safely and independently   OT Frequency 2x / week   OT Duration 8 weeks  plus evaluation   OT Treatment/Interventions Self-care/ADL training;Moist Heat;Fluidtherapy;DME and/or AE instruction;Patient/family  education;Splinting;Therapeutic activities;Neuromuscular education;Functional Mobility Training;Passive range of motion;Cognitive remediation/compensation;Visual/perceptual remediation/compensation;Manual Therapy   Plan coordination HEP as able, putty HEP, UE ROM ex's for strength/endurance (dowel with 1-2 lb weight for high sh flex)   Consulted and Agree with Plan of Care Patient;Family member/caregiver   Family Member Consulted husband      Patient will benefit from skilled therapeutic intervention in order to improve the following deficits and impairments:  Decreased coordination, Decreased range of motion, Impaired flexibility, Decreased endurance, Decreased safety awareness, Impaired sensation, Decreased knowledge of precautions, Decreased activity tolerance, Impaired UE functional use, Pain, Decreased mobility, Decreased cognition, Decreased strength, Impaired perceived functional ability, Impaired vision/preception  Visit Diagnosis: Other lack of coordination - Plan: Ot plan of care cert/re-cert  Muscle weakness (generalized) - Plan: Ot plan of care cert/re-cert  Other symptoms and signs involving the nervous system - Plan: Ot plan of care cert/re-cert  Apraxia - Plan: Ot plan of care cert/re-cert  Other disturbances of skin sensation - Plan: Ot plan of care cert/re-cert      G-Codes - 93/57/01 1628    Functional Assessment Tool Used (Outpatient only)  9 hole: unable, Box & Blocks Rt = 17, Lt = 10   Functional Limitation Carrying, moving and handling objects   Carrying, Moving and Handling Objects Current Status [phone]) At least 57 percent but less than 100 percent impaired, limited or restricted   Carrying, Moving and Handling Objects Goal Status (C0034) At least 40 percent but less than 60 percent impaired, limited or restricted      Problem List Patient Active Problem List   Diagnosis Date Noted  . Gait abnormality 11/14/2016  . Hypotension due to drugs   . Urinary  retention   . Anxiety about health   . Reactive depression   . Ataxia   . Abdominal spasms   . Constipation due to pain medication   . Acute lower UTI   . Dysuria   . Acute deep vein thrombosis (DVT) of popliteal vein of left lower extremity ([place])   . Incomplete paraplegia ([place])   . Acute blood loss anemia   . Neurogenic bladder   . Neuropathic pain   . Muscle spasm   . Gastroesophageal reflux disease   . Slow transit constipation   . Thrombocytopenia ([place]) 10/03/2016  . Abnormal MRI, spinal cord   . Encephalomyelitis   . Numbness   . Intractable back pain 09/20/2016  . Numbness of left lower extremity 09/20/2016  . Hyponatremia 09/20/2016  . Herpes zoster without complication 91/79/1505    Carey Bullocks, OTR/L 12/06/2016, 4:49 PM  [facility] [address] [place], [name], [address] Phone: [phone]   Fax:  [phone]  Name: Katelyn Lamb MRN: [id number] Date of Birth: Mar 04, 1946

## 2016-12-06 NOTE — Telephone Encounter (Signed)
I called patient. The patient has not been feeling as well over the last several days, today she has had a significant change in her walking, not able to ambulate well.  I will see her in office tomorrow at 9:00 for an urgent work in.

## 2016-12-06 NOTE — Telephone Encounter (Signed)
Patients husband called office in reference to patient having symptoms today of numbness bilateral legs, bottom of feet, and can barely walk.  Patient did therapy today could hardly stand up.  Per patient she use to could walk with out help and now she requires help.  Patient feels she is not getting better, but worse.

## 2016-12-06 NOTE — Therapy (Signed)
[facility] [address] [place], [name], [address] Phone: [phone]   Fax:  [phone]  Physical Therapy Evaluation  Patient Details  Name: Katelyn Lamb MRN: [id number] Date of Birth: 01-Jul-1946 Referring Provider: Carlos Levering, PA-C  Encounter Date: 12/06/2016      PT End of Session - 12/06/16 1615    Visit Number 1   Number of Visits 17   Date for PT Re-Evaluation 02/04/17   Authorization Type [employer]: G-CODE AND PROGRESS NOTE EVERY 10TH VISIT.    PT Start Time 1447   PT Stop Time 1530   PT Time Calculation (min) 43 min   Equipment Utilized During Treatment Gait belt   Activity Tolerance Patient tolerated treatment well   Behavior During Therapy WFL for tasks assessed/performed      Past Medical History:  Diagnosis Date  . Anxiety   . Back pain   . Gait abnormality 11/14/2016  . 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 ABDOMINAL HYSTERECTOMY    . BLADDER REPAIR    . CESAREAN SECTION    . TUBAL LIGATION      There were no vitals filed for this visit.       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 Pt reported pain and sensation issues began in low back, radiated towards abdomen and then travelled to hands and feet. Pt reports balance, strength, walking and sensation is impaired since onset of myelitis. One fall in the last six months, while standing from commode-she fell into bathtub. Pt reported she reported she did hit her head but denied HA, changes in cognition, or other injuries.  Pt reports BLE sensation has been getting worse over the last few days.    Patient is accompained by: Family member  Husband: [name]   Pertinent History LLE DVT, anxiety, B cataract surgery in 2017   Patient Stated Goals Pt would like to be IND in everything (walk,  feed dog, and perform ADLs safely).    Currently in Pain? Yes   Pain Score 5    Pain Location Back  and hands and feet   Pain Orientation Upper;Mid;Lower   Pain Descriptors / Indicators --  nerve pain   Pain Type Neuropathic pain;Chronic pain   Pain Radiating Towards BLEs   Pain Onset More than a month ago   Pain Frequency Constant   Aggravating Factors  sitting up tall   Pain Relieving Factors sleep            [facility] PT Assessment - 12/06/16 1501      Assessment   Medical Diagnosis Incomplete paraplegia   Referring Provider Carlos Levering, PA-C   Onset Date/Surgical Date 09/09/16   Hand Dominance Right   Prior Therapy CIR for 18 days     Precautions   Precautions Fall     Restrictions   Weight Bearing Restrictions No     Balance Screen   Has the patient fallen in the past 6 months Yes   How many times? 1   Has the patient had a decrease in activity level because of a fear of falling?  Yes   Is the patient reluctant to leave their home because of a fear of falling?  No     Home Ecologist residence   Living Arrangements Spouse/significant  other   Available Help at Discharge Family   Type of [place] to enter   Entrance Stairs-Number of Steps 2   Entrance Stairs-Rails None   Home Layout Two level;Able to live on main level with bedroom/bathroom   Alternate Level Stairs-Number of Steps 12   Alternate Level Stairs-Rails Left   Home Equipment Shower seat     Prior Function   Level of Independence Independent   Vocation Retired   Lexicographer, walk Orthoptist   Overall Cognitive Status Within Functional Limits for tasks assessed     Sensation   Light Touch Impaired by gross assessment   Additional Comments Decr. light touch in BLEs vs. light touch on pt's cheek.     Coordination   Gross Motor Movements are Fluid and Coordinated No   Fine Motor Movements are Fluid and Coordinated No    Coordination and Movement Description Dysmetria during finger to nose, ataxia during heel shin test (B)     Tone   Assessment Location Right Lower Extremity;Left Lower Extremity     ROM / Strength   AROM / PROM / Strength AROM;Strength     AROM   Overall AROM  Deficits   Overall AROM Comments R LE WFL and L LE WFL except decr. L ankle DF to approx. neutral.     Strength   Overall Strength Deficits   Strength Assessment Site Hip;Knee;Ankle   Right/Left Hip Right;Left   Right Hip Flexion 4-/5   Right Hip Extension --  not formally tested but weakness suspected 2/2 gait   Right Hip ABduction --  tested in seated position: 3+/5   Right Hip ADduction --  tested in seated position: 3+/5   Left Hip Flexion 3+/5   Left Hip Extension --  not formally tested, weakness suspected 2/2 gait   Left Hip ABduction 3+/5  in seated position   Left Hip ADduction 3+/5  in seated position   Right/Left Knee Right;Left   Right Knee Flexion 4-/5   Right Knee Extension 4/5   Left Knee Flexion 4-/5   Left Knee Extension 4/5   Right/Left Ankle Right;Left   Right Ankle Dorsiflexion 4/5   Left Ankle Dorsiflexion 2/5     Transfers   Transfers Sit to Stand;Stand to Sit   Sit to Stand 4: Min assist;4: Min guard;Without upper extremity assist;From chair/3-in-1   Sit to Stand Details (indicate cue type and reason) Pt unable to use UE support 2/2 decr. sensation, cues for safety and technique, PT held back of chair during STS txfs.   Stand to Sit 4: Min guard;Without upper extremity assist;To chair/3-in-1     Ambulation/Gait   Ambulation/Gait Yes   Ambulation/Gait Assistance 4: Min assist;4: Min guard   Ambulation/Gait Assistance Details Pt experienced intermittent LOB and scissoring gait pattern. 3 LOB episodes which PT assisted pt in correcting with min A and HHA.   Ambulation Distance (Feet) 100 Feet   Assistive device None   Gait Pattern Step-through pattern;Decreased stride length;Decreased  dorsiflexion - left;Decreased arm swing - left;Decreased arm swing - right;Ataxic;Scissoring   Ambulation Surface Level;Indoor   Gait velocity 1.81ft/sec.  no AD      Standardized Balance Assessment   Standardized Balance Assessment Berg Balance Test;Timed Up and Go Test     Berg Balance Test   Sit to Stand Needs minimal aid to stand or to stabilize   Standing Unsupported Able to stand 2 minutes with supervision  Sitting with Back Unsupported but Feet Supported on Floor or Stool Able to sit safely and securely 2 minutes   Stand to Sit Uses backs of legs against chair to control descent   Transfers Able to transfer with verbal cueing and /or supervision   Standing Unsupported with Eyes Closed Able to stand 3 seconds   Standing Ubsupported with Feet Together Needs help to attain position but able to stand for 30 seconds with feet together   From Standing, Reach Forward with Outstretched Arm Can reach forward >5 cm safely (2")  4" with R UE   From Standing Position, Pick up Object from Floor Able to pick up shoe, needs supervision   From Standing Position, Turn to Look Behind Over each Shoulder Looks behind one side only/other side shows less weight shift   Turn 360 Degrees Needs assistance while turning   Standing Unsupported, Alternately Place Feet on Step/Stool Able to complete >2 steps/needs minimal assist   Standing Unsupported, One Foot in Front Able to take small step independently and hold 30 seconds   Standing on One Leg Tries to lift leg/unable to hold 3 seconds but remains standing independently   Total Score 27     Timed Up and Go Test   TUG Normal TUG   Normal TUG (seconds) 13.8  no AD     RLE Tone   RLE Tone Within Functional Limits     LLE Tone   LLE Tone Within Functional Limits                           PT Education - 12/06/16 1613    Education provided Yes   Education Details PT discussed outcome measurements, PT POC and frequency/duration.  PT encouraged to notify MD regarding changes in sensation, which has been getting worse this week.    Person(s) Educated Patient;Spouse   Methods Explanation   Comprehension Verbalized understanding          PT Short Term Goals - 12/06/16 1620      PT SHORT TERM GOAL #1   Title Pt will be IND in HEP to improve strength, flexibility, and balance. TARGET DATE FOR ALL STGS: 01/03/17   Status New     PT SHORT TERM GOAL #2   Title Pt will perform TUG in </=13.5sec., IND, to decr. falls risk.   Status New     PT SHORT TERM GOAL #3   Title Pt will improve BERG score to >/=35/56 to decr. falls risk.   Status New     PT SHORT TERM GOAL #4   Title Pt will amb. 300', at MOD I level, over even terrain with LRAD to improve functional mobility.    Status New           PT Long Term Goals - 12/06/16 1622      PT LONG TERM GOAL #1   Title Pt will improve gait speed to >/=2.40ft/sec., no AD, to safely amb. in the community. TARGET DATE FOR ALL LTGS: 01/31/17   Status New     PT LONG TERM GOAL #2   Title Pt will improve BERG score to >/=45/56 to decr. falls risk.    Status New     PT LONG TERM GOAL #3   Title Pt will amb. 600' over even/uneven terrain, at MOD I level, to progress to walking dog safely.    Status New     PT LONG TERM GOAL #4  Title Pt will verbalize understanding of fall prevention strategies to reduce falls risk.    Status New               Plan - 12/06/16 1615    Clinical Impression Statement Pt is a pleasant 71y/o female presenting to with incomplete paraplegia 2/2 myelitis and encephalitis, with onset of 09/09/16. Pt's PMH significant for the following: LLE DVT, anxiety, B cataract surgery in 2017. The following impairments were noted during PT exam: gait deviations, impaired coordination, decr. strength, decr. L ankle ROM, decr. safety awareness, impaired sensation. and pain. Pt's gait speed was slightly above falls risk cutoff, however, it was below the  2.46ft/sec which indicates pt is not able to safely amb. in the community. Pt's TUG time and BERG score indicate pt is at a falls risk. Pt reports she is unable to use an AD, as she cannot grip an AD. Pt required frequent cues for safety and to wait to stand in order to allow PT to assist pt. Pt would benefit from skilled PT to improve safety during functional mobility.    Rehab Potential Good   Clinical Impairments Affecting Rehab Potential see above   PT Frequency 2x / week   PT Duration 8 weeks   PT Treatment/Interventions ADLs/Self Care Home Management;Electrical Stimulation;Biofeedback;Neuromuscular re-education;Balance training;Therapeutic exercise;Therapeutic activities;Manual techniques;Functional mobility training;Stair training;Gait training;DME Instruction;Orthotic Fit/Training;Patient/family education;Vestibular   PT Next Visit Plan Provide fall prevention handout. Initiate balance, strength, and flexibility HEP.   Consulted and Agree with Plan of Care Patient;Family member/caregiver   Family Member Consulted husband: [name]      Patient will benefit from skilled therapeutic intervention in order to improve the following deficits and impairments:  Abnormal gait, Pain, Decreased knowledge of use of DME, Decreased strength, Decreased mobility, Decreased balance, Decreased range of motion, Decreased coordination, Decreased safety awareness, Impaired flexibility, Impaired sensation (PT will not directly address pain but will monitor closely)  Visit Diagnosis: Paraplegia, incomplete ([place]) - Plan: PT plan of care cert/re-cert  Other abnormalities of gait and mobility - Plan: PT plan of care cert/re-cert  Other lack of coordination - Plan: PT plan of care cert/re-cert      G-Codes - 34/19/62 1625    Functional Assessment Tool Used (Outpatient Only) TUG no AD: 13.8sec.; gait speed no AD: 1.69ft/sec.; BERG: 27/56   Functional Limitation Mobility: Walking and moving around   Mobility:  Walking and Moving Around Current Status (I2979) At least 60 percent but less than 80 percent impaired, limited or restricted   Mobility: Walking and Moving Around Goal Status (G9211) At least 20 percent but less than 40 percent impaired, limited or restricted       Problem List Patient Active Problem List   Diagnosis Date Noted  . Gait abnormality 11/14/2016  . Hypotension due to drugs   . Urinary retention   . Anxiety about health   . Reactive depression   . Ataxia   . Abdominal spasms   . Constipation due to pain medication   . Acute lower UTI   . Dysuria   . Acute deep vein thrombosis (DVT) of popliteal vein of left lower extremity ([place])   . Incomplete paraplegia ([place])   . Acute blood loss anemia   . Neurogenic bladder   . Neuropathic pain   . Muscle spasm   . Gastroesophageal reflux disease   . Slow transit constipation   . Thrombocytopenia ([place]) 10/03/2016  . Abnormal MRI, spinal cord   . Encephalomyelitis   .  Numbness   . Intractable back pain 09/20/2016  . Numbness of left lower extremity 09/20/2016  . Hyponatremia 09/20/2016  . Herpes zoster without complication 14/64/3142    Laneka Mcgrory L 12/06/2016, 4:27 PM  [facility] [address] [place], [name], [address] Phone: [phone]   Fax:  [phone]  Name: Katelyn Lamb MRN: [id number] Date of Birth: Feb 07, 1946  Geoffry Paradise, PT,DPT 12/06/16 4:28 PM Phone: [phone] Fax: [phone]

## 2016-12-07 ENCOUNTER — Encounter: Payer: Self-pay | Admitting: Neurology

## 2016-12-07 ENCOUNTER — Ambulatory Visit (INDEPENDENT_AMBULATORY_CARE_PROVIDER_SITE_OTHER): Payer: PPO | Admitting: Neurology

## 2016-12-07 VITALS — BP 110/85 | HR 111 | Ht 66.0 in | Wt 137.0 lb

## 2016-12-07 DIAGNOSIS — G0490 Encephalitis and encephalomyelitis, unspecified: Secondary | ICD-10-CM

## 2016-12-07 DIAGNOSIS — G8222 Paraplegia, incomplete: Secondary | ICD-10-CM

## 2016-12-07 MED ORDER — PREDNISONE 10 MG PO TABS: ORAL_TABLET | ORAL | 0 refills | Status: DC

## 2016-12-07 MED ORDER — ACYCLOVIR 800 MG PO TABS: 800.0000 mg | ORAL_TABLET | Freq: Three times a day (TID) | ORAL | 0 refills | Status: DC

## 2016-12-07 NOTE — Progress Notes (Signed)
Reason for visit: Encephalomyelitis  Katelyn Lamb is a 71 y.o. female  History of present illness:  Katelyn Lamb is a 71 year old right-handed white female with a history of a HSV 2 encephalomyelitis that has left her with a significant gait disorder, and sensory alteration involving the arms and legs and weakness in the hands. The patient indicates that she has had a change in her clinical condition over the last 2 or 3 days. The patient has had increased numbness sensation in the legs and in the midsection of the body. The patient continues to have significant weakness of both hands, difficulty using the hands, and she is dropping things frequently. She has increased discomfort in the hands with cold sensations and burning sensations in the fingers. She has had some blurring of vision over the last week or so. She has had an alteration in her ability to ambulate, she was almost nonambulatory yesterday, she feels somewhat better today. The gait alteration began about 2 days ago. The patient has not noted any change in control of the bowels or the bladder but she does have some difficulty with chronic constipation. The patient does have some dizziness. She denies any headache, she did have a fall and bumped the left frontal area of the head 5 days ago without loss of consciousness. She returns to the office today for an evaluation.  Past Medical History:  Diagnosis Date  . Anxiety   . Back pain   . Gait abnormality 11/14/2016  . Myelitis due to herpes simplex [facility])     Past Surgical History:  Procedure Laterality Date  . ABDOMINAL HYSTERECTOMY    . BLADDER REPAIR    . CESAREAN SECTION    . TUBAL LIGATION      Family History  Problem Relation Age of Onset  . Hypertension Mother     Social history:  reports that she has quit smoking. She has never used smokeless tobacco. She reports that she does not drink alcohol or use drugs.  Medications:  Prior to Admission medications     Medication Sig Start Date End Date Taking? Authorizing Provider  ALPRAZolam Duanne Moron) 1 MG tablet Take 1 mg by mouth as needed. 10/26/16  Yes Historical Provider, MD  calcium carbonate (OS-CAL) 600 MG TABS tablet Take 600 mg by mouth 2 (two) times daily with a meal.   Yes Historical Provider, MD  cholecalciferol (VITAMIN D) 1000 units tablet Take 1,000 Units by mouth daily.   Yes Historical Provider, MD  diazepam (VALIUM) 5 MG tablet Take 5 mg by mouth 2 (two) times daily. 10/26/16  Yes Historical Provider, MD  DULoxetine (CYMBALTA) 30 MG capsule Take 1 capsule (30 mg total) by mouth 2 (two) times daily. 11/14/16  Yes [name], MD  gabapentin (NEURONTIN) 300 MG capsule Take 1 capsule (300 mg total) by mouth 3 (three) times daily. 11/09/16 12/09/16 Yes Ankit Lorie Phenix, MD  HYDROcodone-acetaminophen (NORCO) 10-325 MG tablet Take 1-2 tablets by mouth every 4 (four) hours as needed for moderate pain. 10/26/16  Yes Daniel J Angiulli, PA-C  omega-3 acid ethyl esters (LOVAZA) 1 g capsule Take 1 capsule (1 g total) by mouth 2 (two) times daily. 10/26/16  Yes Daniel J Angiulli, PA-C  pantoprazole (PROTONIX) 40 MG tablet Take 1 tablet (40 mg total) by mouth daily. 10/26/16  Yes Daniel J Angiulli, PA-C  polyethylene glycol (MIRALAX / GLYCOLAX) packet Take 17 g by mouth daily. Patient taking differently: Take 17 g by mouth daily  as needed.  10/26/16  Yes Daniel J Angiulli, PA-C  rivaroxaban (XARELTO) 20 MG TABS tablet 20 mg tablet daily to begin 10/31/2016 10/26/16  Yes Daniel J Angiulli, PA-C  acyclovir (ZOVIRAX) 800 MG tablet Take 1 tablet (800 mg total) by mouth 3 (three) times daily. 12/07/16   [name], MD  predniSONE (DELTASONE) 10 MG tablet Begin taking 6 tablets daily, taper by one tablet every other day until off the medication. 12/07/16   [name], MD      Allergies  Allergen Reactions  . Demerol [Meperidine] Other (See Comments)    Hallucinations  . Amoxicillin-Pot Clavulanate Other  (See Comments)  . Penicillins Other (See Comments)    ROS:  Out of a complete 14 system review of symptoms, the patient complains only of the following symptoms, and all other reviewed systems are negative.  Decreased activity, chills, fatigue Loss of vision Excessive thirst Constipation  Blood pressure 110/85, pulse (!) 111, height 5\' 6"  (1.676 m), weight 137 lb (62.1 kg).  Physical Exam  General: The patient is alert and cooperative at the time of the examination.  Eyes: Pupils are equal, round, and reactive to light. Discs are flat bilaterally.  Neck: The neck is supple, no carotid bruits are noted.  Respiratory: The respiratory examination is clear.  Cardiovascular: The cardiovascular examination reveals a regular rate and rhythm, no obvious murmurs or rubs are noted.  Skin: Extremities are without significant edema.  Neurologic Exam  Mental status: The patient is alert and oriented x 3 at the time of the examination. The patient has apparent normal recent and remote memory, with an apparently normal attention span and concentration ability.  Cranial nerves: Facial symmetry is present. There is good sensation of the face to pinprick and soft touch bilaterally. The strength of the facial muscles and the muscles to head turning and shoulder shrug are normal bilaterally. Speech is well enunciated, no aphasia or dysarthria is noted. Extraocular movements are full. Visual fields are full. The tongue is midline, and the patient has symmetric elevation of the soft palate. No obvious hearing deficits are noted.  Motor: The motor testing reveals 4/5 strength with the intrinsic muscles of the hands bilaterally and with grip strength bilaterally. The patient has 4+/5 strength proximally in both arms. With the lower extremity is, the patient has 4+/5 strength with hip flexion bilaterally, near normal strength distally in both legs.  Sensory: Sensory testing is notable for symmetric  pinprick sensation in the arms and legs, but vibration sensation is severely impaired on all 4 extremities.  Coordination: Cerebellar testing reveals ataxia with finger-nose-finger and heel-to-shin bilaterally.  Gait and station: Gait is wide-based, the patient is able to ambulate without assistance. The Romberg testing is unsteady, the patient does not fall. Tandem gait was not attempted. The patient requires assistance with standing from a seated position.  Reflexes: Deep tendon reflexes are symmetric, but are slightly depressed bilaterally.    Assessment/Plan:  1. HSV 2 encephalomyelitis  2. Gait disorder  3. Chronic pain syndrome  The patient has had some worsening of her clinical condition since last seen with worsening of gait and balance, and a change in sensation in the body and lower extremities. The patient will be placed on acyclovir 800 mg 3 times daily for 3 weeks, then he converted to a suppressive regimen of this medication. The patient will be placed on a prednisone Dosepak, 10 mg 12 day pack. She is getting ongoing therapy. I will get  an infectious disease consultation. MRI of the brain and cervical spine will be done with and without gadolinium enhancement. She will follow-up in 6 weeks. If her clinical condition dramatically changes, she is go to the emergency room for an evaluation.   For her constipation, she will go on Colace and Senokot, she is already taking MiraLAX on a daily basis.  Jill Alexanders MD 12/07/2016 9:37 AM  [facility] [address] [place], [place] [address]  Phone [phone] Fax [phone]

## 2016-12-07 NOTE — Telephone Encounter (Signed)
Placed pt on schedule at 9am today with Dr Jannifer Franklin as requested.

## 2016-12-10 ENCOUNTER — Ambulatory Visit: Payer: PPO | Admitting: Occupational Therapy

## 2016-12-10 ENCOUNTER — Encounter: Payer: Self-pay | Admitting: Occupational Therapy

## 2016-12-10 DIAGNOSIS — G8222 Paraplegia, incomplete: Secondary | ICD-10-CM | POA: Diagnosis not present

## 2016-12-10 DIAGNOSIS — R29818 Other symptoms and signs involving the nervous system: Secondary | ICD-10-CM

## 2016-12-10 DIAGNOSIS — R208 Other disturbances of skin sensation: Secondary | ICD-10-CM

## 2016-12-10 DIAGNOSIS — M6281 Muscle weakness (generalized): Secondary | ICD-10-CM

## 2016-12-10 DIAGNOSIS — R482 Apraxia: Secondary | ICD-10-CM

## 2016-12-10 DIAGNOSIS — R278 Other lack of coordination: Secondary | ICD-10-CM

## 2016-12-10 NOTE — Therapy (Signed)
[facility] [address] [place], [name], [address] Phone: [phone]   Fax:  [phone]  Occupational Therapy Treatment  Patient Details  Name: Katelyn Lamb MRN: [id number] Date of Birth: 09-12-1945 Referring Provider: Carlos Levering PA-C  Encounter Date: 12/10/2016      OT End of Session - 12/10/16 6269    Visit Number 2   Number of Visits 17   Date for OT Re-Evaluation 02/04/17   Authorization Type [employer] - G code required   Authorization - Visit Number 2   Authorization - Number of Visits 10   OT Start Time 4854   OT Stop Time 1401   OT Time Calculation (min) 45 min   Activity Tolerance Patient tolerated treatment well      Past Medical History:  Diagnosis Date  . Anxiety   . Back pain   . Gait abnormality 11/14/2016  . Myelitis due to herpes simplex [facility])     Past Surgical History:  Procedure Laterality Date  . ABDOMINAL HYSTERECTOMY    . BLADDER REPAIR    . CESAREAN SECTION    . TUBAL LIGATION      There were no vitals filed for this visit.      Subjective Assessment - 12/10/16 1321    Subjective  I feel stiff as a board   Pertinent History C3-4 incomplete paraplegia, HSV encephalitis and myelitis, anxiety   Limitations no driving, fall risk   Patient Stated Goals to be normal and get stronger   Currently in Pain? Yes   Pain Score 5    Pain Location --  all over my body  pins and needles   Pain Descriptors / Indicators Pins and needles   Pain Type Neuropathic pain   Pain Onset More than a month ago   Pain Frequency Constant   Aggravating Factors  moving makes it worse   Pain Relieving Factors rest,    Multiple Pain Sites Yes   Pain Score 5   Pain Location Back   Pain Orientation --  my entire back   Pain Descriptors / Indicators Spasm   Pain Type Chronic pain   Pain Onset More than a month ago   Pain Frequency Constant   Aggravating Factors  certain postures,  cold   Pain Relieving Factors heat,                       OT Treatments/Exercises (OP) - 12/10/16 0001      ADLs   Overall ADLs Addressed bed positioning with pt - pt currently unable to lay on her side - can only lay on her back and finds that she has low back pain and wakes up very stiff. Demonstrated sidelying with pillows for trunk support and alignment of LE"s an UE's.  Pt tolerated well. Husband concerned that pt can't feel arms well and concerned that pt may lay on arm in wrong position. Discussed using sidelying for first 15 minutes that pt is in bed for weight bearing on right side as beginning. Pt and husband verbalized understanding.  Also addressed roll under pt's knees when in supine to relieve pressure on low back and reduce hyperextension. Husband took pictures of all positioning.    ADL Comments Reviewed all goasl and tx with pt - pt in agreement.  Written copy of goals provided to pt and husband     Neurological Re-education Exercises   Other Exercises 1 NEuro re ed in  supine in closed chain activity for midline reach with cues to move slow and use vision to compensate for impaired sensation and incoordination of UE's.  Pt demonstrate apraxia, ataxic UE's movements and impaired attention.                  OT Education - 12/10/16 1640    Education provided Yes   Education Details bed positioning   Methods Explanation;Demonstration;Verbal cues;Other (comment)  husband took pictures   Comprehension Verbalized understanding;Returned demonstration          OT Short Term Goals - 12/10/16 1641      OT SHORT TERM GOAL #1   Title Independent with initial HEP (Due 01/05/17)   Time 4   Period Weeks   Status New     OT SHORT TERM GOAL #2   Title Pt to feed self 75% of the time   Time 4   Period Weeks   Status New     OT SHORT TERM GOAL #3   Title Pt to dress self overall with min assist   Time 4   Period Weeks   Status New     OT SHORT TERM GOAL  #4   Title Pt to write name with Rt hand with 75% legibility with A/E prn   Time 4   Period Weeks   Status New     OT SHORT TERM GOAL #5   Title Improve bilateral UE function as evidenced by performing Box & Blocks with at least an 8 block improvement   Baseline eval: Rt = 17, Lt = 10    Time 4   Period Weeks   Status New     OT SHORT TERM GOAL #6   Title Pt to verbalize understanding with safety considerations d/t lack of sensation bilaterally   Time 4   Period Weeks   Status New           OT Long Term Goals - 12/10/16 1641      OT LONG TERM GOAL #1   Title Independent with updated HEP (Due 02/04/17)   Time 8   Period Weeks   Status New     OT LONG TERM GOAL #2   Title Improve coordination as evidenced by performing 9 hole peg test bilaterally in 2 sec. or under   Time 8   Period Weeks   Status New     OT LONG TERM GOAL #3   Title Pt to perform simple snack/sandwich prep mod I level   Time 8   Period Weeks   Status New     OT LONG TERM GOAL #4   Title Pt to demo sufficient UE strength as demo by folding laundry for 10 minutes w/o rest   Time 8   Period Weeks   Status New     OT LONG TERM GOAL #5   Title Pt to improve grip strength by 10 lbs or greater from initial eval   Baseline eval: Rt = 23 lbs, Lt = 10 lbs   Time 8   Period Weeks   Status New               Plan - 12/10/16 1641    Clinical Impression Statement Reviewed goals and POC with pt and husband - they are in agreement.    Rehab Potential Fair   Clinical Impairments Affecting Rehab Potential apraxia, cognitive deficits, ataxia, severe sensory impairment from chest level down.    OT Frequency 2x /  week   OT Duration 8 weeks   OT Treatment/Interventions Self-care/ADL training;Moist Heat;Fluidtherapy;DME and/or AE instruction;Patient/family education;Splinting;Therapeutic activities;Neuromuscular education;Functional Mobility Training;Passive range of motion;Cognitive  remediation/compensation;Visual/perceptual remediation/compensation;Manual Therapy   Plan check bed positioning, NMR for core and proximal stability, balance, functional mobility, closed chain activities, address pain in L shoulder,    Consulted and Agree with Plan of Care Patient;Family member/caregiver   Family Member Consulted husband      Patient will benefit from skilled therapeutic intervention in order to improve the following deficits and impairments:  Decreased coordination, Decreased range of motion, Impaired flexibility, Decreased endurance, Decreased safety awareness, Impaired sensation, Decreased knowledge of precautions, Decreased activity tolerance, Impaired UE functional use, Pain, Decreased mobility, Decreased cognition, Decreased strength, Impaired perceived functional ability, Impaired vision/preception  Visit Diagnosis: Muscle weakness (generalized)  Other symptoms and signs involving the nervous system  Apraxia  Other disturbances of skin sensation  Other lack of coordination    Problem List Patient Active Problem List   Diagnosis Date Noted  . Gait abnormality 11/14/2016  . Hypotension due to drugs   . Urinary retention   . Anxiety about health   . Reactive depression   . Ataxia   . Abdominal spasms   . Constipation due to pain medication   . Acute lower UTI   . Dysuria   . Acute deep vein thrombosis (DVT) of popliteal vein of left lower extremity ([place])   . Incomplete paraplegia ([place])   . Acute blood loss anemia   . Neurogenic bladder   . Neuropathic pain   . Muscle spasm   . Gastroesophageal reflux disease   . Slow transit constipation   . Thrombocytopenia ([place]) 10/03/2016  . Abnormal MRI, spinal cord   . Encephalomyelitis   . Numbness   . Intractable back pain 09/20/2016  . Numbness of left lower extremity 09/20/2016  . Hyponatremia 09/20/2016  . Herpes zoster without complication 14/43/1540    Quay Burow, OTR/L 12/10/2016, 4:46  PM  [facility] [address] [place], [name], [address] Phone: [phone]   Fax:  [phone]  Name: Katelyn Lamb MRN: [id number] Date of Birth: 01/28/1946

## 2016-12-13 ENCOUNTER — Ambulatory Visit: Payer: PPO | Admitting: Occupational Therapy

## 2016-12-13 ENCOUNTER — Other Ambulatory Visit: Payer: Self-pay | Admitting: *Deleted

## 2016-12-13 ENCOUNTER — Encounter: Payer: Self-pay | Admitting: Occupational Therapy

## 2016-12-13 DIAGNOSIS — R482 Apraxia: Secondary | ICD-10-CM

## 2016-12-13 DIAGNOSIS — G0490 Encephalitis and encephalomyelitis, unspecified: Secondary | ICD-10-CM

## 2016-12-13 DIAGNOSIS — G8222 Paraplegia, incomplete: Secondary | ICD-10-CM | POA: Diagnosis not present

## 2016-12-13 DIAGNOSIS — R41844 Frontal lobe and executive function deficit: Secondary | ICD-10-CM

## 2016-12-13 DIAGNOSIS — R208 Other disturbances of skin sensation: Secondary | ICD-10-CM

## 2016-12-13 DIAGNOSIS — R2681 Unsteadiness on feet: Secondary | ICD-10-CM

## 2016-12-13 DIAGNOSIS — R278 Other lack of coordination: Secondary | ICD-10-CM

## 2016-12-13 DIAGNOSIS — R29818 Other symptoms and signs involving the nervous system: Secondary | ICD-10-CM

## 2016-12-13 DIAGNOSIS — M6281 Muscle weakness (generalized): Secondary | ICD-10-CM

## 2016-12-13 NOTE — Therapy (Signed)
[facility] [address] [place], [name], [address] Phone: [phone]   Fax:  [phone]  Occupational Therapy Treatment  Patient Details  Name: Katelyn Lamb MRN: [id number] Date of Birth: [birth year] Referring Provider: Carlos Levering PA-C  Encounter Date: 12/13/2016      OT End of Session - 12/13/16 1654    Visit Number 3   Number of Visits 17   Date for OT Re-Evaluation 02/04/17   Authorization Type [employer] - G code required   Authorization - Visit Number 3   Authorization - Number of Visits 10   OT Start Time 1224   OT Stop Time 1617   OT Time Calculation (min) 46 min   Activity Tolerance Patient tolerated treatment well      Past Medical History:  Diagnosis Date  . Anxiety   . Back pain   . Gait abnormality 11/14/2016  . Myelitis due to herpes simplex [facility])     Past Surgical History:  Procedure Laterality Date  . ABDOMINAL HYSTERECTOMY    . BLADDER REPAIR    . CESAREAN SECTION    . TUBAL LIGATION      There were no vitals filed for this visit.      Subjective Assessment - 12/13/16 1537    Subjective  I had to start prednisone as I had a set back   Patient is accompained by: Family member  husband   Pertinent History HSV2 encephalitis and myelitis, anxiety,    Limitations no driving, fall risk   Patient Stated Goals to be normal and get stronger   Currently in Pain? Yes   Pain Score 3    Pain Location --  all over pins and needles   Pain Descriptors / Indicators Pins and needles   Pain Type Neuropathic pain   Pain Onset More than a month ago   Pain Frequency Constant   Aggravating Factors  moving makes it worse   Pain Relieving Factors rest                      OT Treatments/Exercises (OP) - 12/13/16 0001      Neurological Re-education Exercises   Other Exercises 1 Neuro re ed to address UE strenthenng and motor control in supine with closed chain  activity and 2 pound resistance.  Pt needed mod a for UE control and max cues for impulsivity and decreased sustained attention.  Also addresed proximal stability to decrease limb and trunk ataxia through transitioning into quadraped and then up into half kneeling with UE's in weight bearing. Pt demonstrates apraxia, impaired attention, decreased ability to follow simple one step commands and fear.  Addressed small weight shifts in quadraped and tall kneeling with emphasis on using closed chain positioning and small controlled movements.  Also addressed sit to stand using hands on knees, slowly sliding hands up thighs and into standing.  Also emphasized using slow controlled movements vs momentum.                     OT Short Term Goals - 12/13/16 1639      OT SHORT TERM GOAL #1   Title Pt and husband will be mod I with home activities program - 01/05/17   Status New     OT SHORT TERM GOAL #2   Title Pt to feed self 75% of the time AE prn   Status New     OT SHORT TERM GOAL #  3   Title Pt will require min a for dressing    Time --   Period --   Status New     OT SHORT TERM GOAL #4   Title Pt will complete sit to stand with close superivison and vc's for safety as prep for more independent functional mobility   Time --   Period --   Status New     OT SHORT TERM GOAL #5   Title Improve bilateral UE function as evidenced by performing Box & Blocks with at least an 8 block improvement   Baseline eval: Rt = 17, Lt = 10    Time --   Period --   Status New     OT SHORT TERM GOAL #6   Title Pt will be demonstrate ability for simple activity in standing without UE support with minimal dynamic standing balance with close supervision.     Time --   Period --   Status New     OT SHORT TERM GOAL #7   Title Pt will demonstrate ability for sustained attention with no more than min vc's during functional familiar basic activity   Status New           OT Long Term Goals -  12/13/16 1646      OT LONG TERM GOAL #1   Title Pt and husband Independent with updated HEP (Due 02/04/17)   Status New     OT LONG TERM GOAL #2   Title Pt will demonstrate ability to zip and button with vc's only   Status New     OT LONG TERM GOAL #3   Title Pt to perform simple snack/sandwich prep S level   Status New     OT LONG TERM GOAL #4   Title Pt to demo sufficient UE strength as demo by folding laundry for 10 minutes w/o rest   Status New     OT LONG TERM GOAL #5   Title Pt to improve grip strength by 10 lbs or greater from initial eval   Baseline eval: Rt = 23 lbs, Lt = 10 lbs   Status New     OT LONG TERM GOAL #6   Title Pt will require no more than supervision for dressing    Status New     OT LONG TERM GOAL #7   Title Pt will be supervision for toilet transfers   Status New     OT LONG TERM GOAL #8   Title Pt will be supervision for shower transfers   Baseline 12/13/2016 pt on eval listed as supervision however has had a set back and currently requiring min assist   Status New     OT LONG TERM GOAL  #9   Baseline Pt will demonstrate ability for sustained/selective attention for familar functional task with no more than 2 vc's.    Status New               Plan - 12/13/16 1651    Clinical Impression Statement Pt has had a recent decline and therefore goals readjusted.  See new goals.    Rehab Potential Fair   Clinical Impairments Affecting Rehab Potential apraxia, cognitive deficits, ataxia, severe sensory impairment from chest level down.    OT Frequency 2x / week   OT Duration 8 weeks   OT Treatment/Interventions Self-care/ADL training;Moist Heat;Fluidtherapy;DME and/or AE instruction;Patient/family education;Splinting;Therapeutic activities;Neuromuscular education;Functional Mobility Training;Passive range of motion;Cognitive remediation/compensation;Visual/perceptual remediation/compensation;Manual Therapy;Therapeutic exercise;Balance training  Plan heavy closed chain weight bearing, small slow controlled movements, UE strengthening, functional mobility, address pain in L shoulder   Consulted and Agree with Plan of Care Patient;Family member/caregiver   Family Member Consulted husband      Patient will benefit from skilled therapeutic intervention in order to improve the following deficits and impairments:  Abnormal gait, Decreased activity tolerance, Decreased balance, Decreased cognition, Decreased coordination, Decreased safety awareness, Decreased mobility, Decreased strength, Difficulty walking, Impaired UE functional use, Impaired tone, Impaired sensation, Impaired vision/preception, Pain  Visit Diagnosis: Muscle weakness (generalized)  Other symptoms and signs involving the nervous system  Apraxia  Other disturbances of skin sensation  Other lack of coordination  Unsteadiness on feet  Frontal lobe and executive function deficit    Problem List Patient Active Problem List   Diagnosis Date Noted  . Gait abnormality 11/14/2016  . Hypotension due to drugs   . Urinary retention   . Anxiety about health   . Reactive depression   . Ataxia   . Abdominal spasms   . Constipation due to pain medication   . Acute lower UTI   . Dysuria   . Acute deep vein thrombosis (DVT) of popliteal vein of left lower extremity ([place])   . Incomplete paraplegia ([place])   . Acute blood loss anemia   . Neurogenic bladder   . Neuropathic pain   . Muscle spasm   . Gastroesophageal reflux disease   . Slow transit constipation   . Thrombocytopenia ([place]) 10/03/2016  . Abnormal MRI, spinal cord   . Encephalomyelitis   . Numbness   . Intractable back pain 09/20/2016  . Numbness of left lower extremity 09/20/2016  . Hyponatremia 09/20/2016  . Herpes zoster without complication 62/69/4854    Quay Burow, OTR/L 12/13/2016, 4:56 PM  [facility] [address] [place], [name], [address] Phone: [phone]   Fax:  [phone]  Name: Katelyn Lamb MRN: [id number] Date of Birth: 10-Dec-1945

## 2016-12-18 ENCOUNTER — Encounter: Payer: PPO | Admitting: Occupational Therapy

## 2016-12-18 ENCOUNTER — Ambulatory Visit: Payer: PPO

## 2016-12-19 ENCOUNTER — Ambulatory Visit: Payer: PPO

## 2016-12-19 ENCOUNTER — Encounter: Payer: Self-pay | Admitting: Occupational Therapy

## 2016-12-19 ENCOUNTER — Ambulatory Visit: Payer: PPO | Admitting: Occupational Therapy

## 2016-12-19 DIAGNOSIS — R29818 Other symptoms and signs involving the nervous system: Secondary | ICD-10-CM

## 2016-12-19 DIAGNOSIS — M6281 Muscle weakness (generalized): Secondary | ICD-10-CM

## 2016-12-19 DIAGNOSIS — G8222 Paraplegia, incomplete: Secondary | ICD-10-CM | POA: Diagnosis not present

## 2016-12-19 DIAGNOSIS — R2681 Unsteadiness on feet: Secondary | ICD-10-CM

## 2016-12-19 DIAGNOSIS — R482 Apraxia: Secondary | ICD-10-CM

## 2016-12-19 DIAGNOSIS — R41844 Frontal lobe and executive function deficit: Secondary | ICD-10-CM

## 2016-12-19 DIAGNOSIS — R278 Other lack of coordination: Secondary | ICD-10-CM

## 2016-12-19 DIAGNOSIS — R208 Other disturbances of skin sensation: Secondary | ICD-10-CM

## 2016-12-19 NOTE — Therapy (Signed)
[facility] [address] [place], [name], [address] Phone: [phone]   Fax:  [phone]  Physical Therapy Treatment  Patient Details  Name: Katelyn Lamb MRN: [id number] Date of Birth: 10-13-1945 Referring Provider: Carlos Levering, PA-C  Encounter Date: 12/19/2016      PT End of Session - 12/19/16 1301    Visit Number 2   Number of Visits 17   Date for PT Re-Evaluation 02/04/17   Authorization Type [employer]: G-CODE AND PROGRESS NOTE EVERY 10TH VISIT.    PT Start Time 1104   PT Stop Time 1146   PT Time Calculation (min) 42 min   Equipment Utilized During Treatment --  min guard to S prn   Activity Tolerance Patient tolerated treatment well   Behavior During Therapy WFL for tasks assessed/performed      Past Medical History:  Diagnosis Date  . Anxiety   . Back pain   . Gait abnormality 11/14/2016  . Myelitis due to herpes simplex [facility])     Past Surgical History:  Procedure Laterality Date  . ABDOMINAL HYSTERECTOMY    . BLADDER REPAIR    . CESAREAN SECTION    . TUBAL LIGATION      There were no vitals filed for this visit.      Subjective Assessment - 12/19/16 1108    Subjective Pt reported she took the last prednisone pill today. Pt reported she still has nerve pain, which bothers her.    Patient is accompained by: Family member  husband-Steve   Pertinent History LLE DVT, anxiety, B cataract surgery in 2017   Patient Stated Goals Pt would like to be IND in everything (walk, feed dog, and perform ADLs safely).    Currently in Pain? No/denies          Therex: Pt performed stretches with cues and demo for technique. Please see pt instructions for details.  Neuro re-ed: Pt performed balance activities and STS txfs with cues and demo for technique. Please see pt instructions for details.                   Self Care:     PT Education - 12/19/16 1259    Education  provided Yes   Education Details PT provided pt with fall prevention handout. PT provided pt with flexibility and balance HEP.    Person(s) Educated Patient;Spouse   Methods Explanation;Demonstration;Tactile cues;Verbal cues;Handout   Comprehension Returned demonstration;Verbalized understanding          PT Short Term Goals - 12/06/16 1620      PT SHORT TERM GOAL #1   Title Pt will be IND in HEP to improve strength, flexibility, and balance. TARGET DATE FOR ALL STGS: 01/03/17   Status New     PT SHORT TERM GOAL #2   Title Pt will perform TUG in </=13.5sec., IND, to decr. falls risk.   Status New     PT SHORT TERM GOAL #3   Title Pt will improve BERG score to >/=35/56 to decr. falls risk.   Status New     PT SHORT TERM GOAL #4   Title Pt will amb. 300', at MOD I level, over even terrain with LRAD to improve functional mobility.    Status New           PT Long Term Goals - 12/06/16 1622      PT LONG TERM GOAL #1   Title Pt will improve gait speed to >/=  2.46ft/sec., no AD, to safely amb. in the community. TARGET DATE FOR ALL LTGS: 01/31/17   Status New     PT LONG TERM GOAL #2   Title Pt will improve BERG score to >/=45/56 to decr. falls risk.    Status New     PT LONG TERM GOAL #3   Title Pt will amb. 600' over even/uneven terrain, at MOD I level, to progress to walking dog safely.    Status New     PT LONG TERM GOAL #4   Title Pt will verbalize understanding of fall prevention strategies to reduce falls risk.    Status New               Plan - 12/19/16 1301    Clinical Impression Statement Today's skilled session focused on establishing balance and flexibility exercises for pt's HEP. PT also provided pt with fall prevention handout, as pt with cognitive and physical impairment which incr. pt's risk for falls. Pt tolerated HEP well, no c/o pain. However, pt will require review of HEP 2/2 cognitive impairments.    Rehab Potential Good   Clinical Impairments  Affecting Rehab Potential see above   PT Frequency 2x / week   PT Duration 8 weeks   PT Treatment/Interventions ADLs/Self Care Home Management;Electrical Stimulation;Biofeedback;Neuromuscular re-education;Balance training;Therapeutic exercise;Therapeutic activities;Manual techniques;Functional mobility training;Stair training;Gait training;DME Instruction;Orthotic Fit/Training;Patient/family education;Vestibular   PT Next Visit Plan Initiate functional strengthening HEP.    Consulted and Agree with Plan of Care Patient;Family member/caregiver   Family Member Consulted husband: [name]      Patient will benefit from skilled therapeutic intervention in order to improve the following deficits and impairments:  Abnormal gait, Pain, Decreased knowledge of use of DME, Decreased strength, Decreased mobility, Decreased balance, Decreased range of motion, Decreased coordination, Decreased safety awareness, Impaired flexibility, Impaired sensation (PT will not directly address pain but will monitor closely)  Visit Diagnosis: Other lack of coordination  Unsteadiness on feet  Muscle weakness (generalized)     Problem List Patient Active Problem List   Diagnosis Date Noted  . Gait abnormality 11/14/2016  . Hypotension due to drugs   . Urinary retention   . Anxiety about health   . Reactive depression   . Ataxia   . Abdominal spasms   . Constipation due to pain medication   . Acute lower UTI   . Dysuria   . Acute deep vein thrombosis (DVT) of popliteal vein of left lower extremity ([place])   . Incomplete paraplegia ([place])   . Acute blood loss anemia   . Neurogenic bladder   . Neuropathic pain   . Muscle spasm   . Gastroesophageal reflux disease   . Slow transit constipation   . Thrombocytopenia ([place]) 10/03/2016  . Abnormal MRI, spinal cord   . Encephalomyelitis   . Numbness   . Intractable back pain 09/20/2016  . Numbness of left lower extremity 09/20/2016  . Hyponatremia 09/20/2016  .  Herpes zoster without complication 79/89/2119    Tashara Suder L 12/19/2016, 1:05 PM  [facility] [address] [place], [name], [address] Phone: [phone]   Fax:  [phone]  Name: WINOLA DRUM MRN: [id number] Date of Birth: [birth year]  Geoffry Paradise, PT,DPT 12/19/16 1:06 PM Phone: [phone] Fax: [phone]

## 2016-12-19 NOTE — Therapy (Signed)
[facility] [address] [place], [name], [address] Phone: [phone]   Fax:  [phone]  Occupational Therapy Treatment  Patient Details  Name: Katelyn Lamb MRN: [id number] Date of Birth: 1946/08/03 Referring Provider: Carlos Levering PA-C  Encounter Date: 12/19/2016      OT End of Session - 12/19/16 1245    Visit Number 4   Number of Visits 17   Date for OT Re-Evaluation 02/04/17   Authorization Type [employer] - G code required   Authorization - Visit Number 4   Authorization - Number of Visits 10   OT Start Time 9326   OT Stop Time 1235   OT Time Calculation (min) 50 min   Activity Tolerance Patient tolerated treatment well   Behavior During Therapy [facility] for tasks assessed/performed      Past Medical History:  Diagnosis Date  . Anxiety   . Back pain   . Gait abnormality 11/14/2016  . Myelitis due to herpes simplex [facility])     Past Surgical History:  Procedure Laterality Date  . ABDOMINAL HYSTERECTOMY    . BLADDER REPAIR    . CESAREAN SECTION    . TUBAL LIGATION      There were no vitals filed for this visit.      Subjective Assessment - 12/19/16 1238    Subjective  I took my last dose of prednisone today   Patient is accompained by: Family member   Pertinent History HSV2 encephalitis and myelitis, anxiety,    Limitations no driving, fall risk   Patient Stated Goals to be normal and get stronger   Currently in Pain? No/denies   Pain Score 0-No pain                      OT Treatments/Exercises (OP) - 12/19/16 0001      Neurological Re-education Exercises   Other Exercises 1 Neuro reeducation to address postural control and proximal stability through closed chain transitional movements.  Patient self distracts but is easily redirected to task.  In sidelying worked to shift weight - trunk lateral flexion and extension.  Patient with report of left shoulder pain with  forearm weight bearing in sidesitting- with increased support- pain resolved.  Patient with poorly graded muscle control in limbs and trunk - but with resistance, (increased proiprioceptive input) patient better able to coordiinate motion.     Other Exercises 2 Sit to stand with increased emphasis on biomechanics to encourage forward trunk weight shift to stand with balance over base.  Patient able to reproduce this motion immediately following blocked practice - with cueing.,                  OT Education - 12/19/16 1245    Education provided Yes   Education Details sit to stand alignment,    Person(s) Educated Patient;Spouse   Methods Explanation;Demonstration;Tactile cues   Comprehension Need further instruction          OT Short Term Goals - 12/19/16 1248      OT SHORT TERM GOAL #1   Title Pt and husband will be mod I with home activities program - 01/05/17   Status On-going     OT SHORT TERM GOAL #2   Title Pt to feed self 75% of the time AE prn   Status On-going     OT SHORT TERM GOAL #3   Title Pt will require min a for dressing  Status On-going     OT SHORT TERM GOAL #4   Title Pt will complete sit to stand with close superivison and vc's for safety as prep for more independent functional mobility   Status On-going     OT SHORT TERM GOAL #5   Title Improve bilateral UE function as evidenced by performing [employer] with at least an 8 block improvement   Status On-going     OT SHORT TERM GOAL #6   Title Pt will be demonstrate ability for simple activity in standing without UE support with minimal dynamic standing balance with close supervision.     Status On-going     OT SHORT TERM GOAL #7   Title Pt will demonstrate ability for sustained attention with no more than min vc's during functional familiar basic activity   Status On-going           OT Long Term Goals - 12/19/16 1248      OT LONG TERM GOAL #1   Title Pt and husband Independent with  updated HEP (Due 02/04/17)   Status On-going     OT LONG TERM GOAL #2   Title Pt will demonstrate ability to zip and button with vc's only   Status On-going     OT LONG TERM GOAL #3   Title Pt to perform simple snack/sandwich prep S level   Status On-going     OT LONG TERM GOAL #4   Title Pt to demo sufficient UE strength as demo by folding laundry for 10 minutes w/o rest   Status On-going     OT LONG TERM GOAL #5   Title Pt to improve grip strength by 10 lbs or greater from initial eval   Status On-going     OT LONG TERM GOAL #6   Title Pt will require no more than supervision for dressing    Status On-going     OT LONG TERM GOAL #7   Title Pt will be supervision for toilet transfers   Status On-going     OT LONG TERM GOAL #8   Title Pt will be supervision for shower transfers   Status On-going     OT LONG TERM GOAL  #9   Baseline Pt will demonstrate ability for sustained/selective attention for familar functional task with no more than 2 vc's.    Status On-going               Plan - 12/19/16 1246    Clinical Impression Statement Patient with significant physical and cognitive impairments, new goals established recently.  Working toward [employer].   Rehab Potential Fair   Clinical Impairments Affecting Rehab Potential apraxia, cognitive deficits, ataxia, severe sensory impairment from chest level down.    OT Frequency 2x / week   OT Duration 8 weeks   OT Treatment/Interventions Self-care/ADL training;Moist Heat;Fluidtherapy;DME and/or AE instruction;Patient/family education;Splinting;Therapeutic activities;Neuromuscular education;Functional Mobility Training;Passive range of motion;Cognitive remediation/compensation;Visual/perceptual remediation/compensation;Manual Therapy;Therapeutic exercise;Balance training   Plan Transitional movements, closed chain, small controlled movement- patient uses momnetum and overuses LE's.    Consulted and Agree with Plan of Care  Patient;Family member/caregiver   Family Member Consulted husband      Patient will benefit from skilled therapeutic intervention in order to improve the following deficits and impairments:  Abnormal gait, Decreased activity tolerance, Decreased balance, Decreased cognition, Decreased coordination, Decreased safety awareness, Decreased mobility, Decreased strength, Difficulty walking, Impaired UE functional use, Impaired tone, Impaired sensation, Impaired vision/preception, Pain  Visit Diagnosis: Muscle  weakness (generalized)  Other symptoms and signs involving the nervous system  Apraxia  Other disturbances of skin sensation  Other lack of coordination  Unsteadiness on feet  Frontal lobe and executive function deficit    Problem List Patient Active Problem List   Diagnosis Date Noted  . Gait abnormality 11/14/2016  . Hypotension due to drugs   . Urinary retention   . Anxiety about health   . Reactive depression   . Ataxia   . Abdominal spasms   . Constipation due to pain medication   . Acute lower UTI   . Dysuria   . Acute deep vein thrombosis (DVT) of popliteal vein of left lower extremity ([place])   . Incomplete paraplegia ([place])   . Acute blood loss anemia   . Neurogenic bladder   . Neuropathic pain   . Muscle spasm   . Gastroesophageal reflux disease   . Slow transit constipation   . Thrombocytopenia ([place]) 10/03/2016  . Abnormal MRI, spinal cord   . Encephalomyelitis   . Numbness   . Intractable back pain 09/20/2016  . Numbness of left lower extremity 09/20/2016  . Hyponatremia 09/20/2016  . Herpes zoster without complication 94/76/5465    Mariah Milling 12/19/2016, 12:50 PM  [facility] [address] [place], [name], [address] Phone: [phone]   Fax:  [phone]  Name: Katelyn Lamb MRN: [id number] Date of Birth: Jun 01, 1946

## 2016-12-19 NOTE — Patient Instructions (Addendum)
Fall Prevention in the Home Falls can cause injuries and can affect people from all age groups. There are many simple things that you can do to make your home safe and to help prevent falls. What can I do on the outside of my home?  Regularly repair the edges of walkways and driveways and fix any cracks.  Remove high doorway thresholds.  Trim any shrubbery on the main path into your home.  Use bright outdoor lighting.  Clear walkways of debris and clutter, including tools and rocks.  Regularly check that handrails are securely fastened and in good repair. Both sides of any steps should have handrails.  Install guardrails along the edges of any raised decks or porches.  Have leaves, snow, and ice cleared regularly.  Use sand or salt on walkways during winter months.  In the garage, clean up any spills right away, including grease or oil spills. What can I do in the bathroom?  Use night lights.  Install grab bars by the toilet and in the tub and shower. Do not use towel bars as grab bars.  Use non-skid mats or decals on the floor of the tub or shower.  If you need to sit down while you are in the shower, use a plastic, non-slip stool.  Keep the floor dry. Immediately clean up any water that spills on the floor.  Remove soap buildup in the tub or shower on a regular basis.  Attach bath mats securely with double-sided non-slip rug tape.  Remove throw rugs and other tripping hazards from the floor. What can I do in the bedroom?  Use night lights.  Make sure that a bedside light is easy to reach.  Do not use oversized bedding that drapes onto the floor.  Have a firm chair that has side arms to use for getting dressed.  Remove throw rugs and other tripping hazards from the floor. What can I do in the kitchen?  Clean up any spills right away.  Avoid walking on wet floors.  Place frequently used items in easy-to-reach places.  If you need to reach for something above  you, use a sturdy step stool that has a grab bar.  Keep electrical cables out of the way.  Do not use floor polish or wax that makes floors slippery. If you have to use wax, make sure that it is non-skid floor wax.  Remove throw rugs and other tripping hazards from the floor. What can I do in the stairways?  Do not leave any items on the stairs.  Make sure that there are handrails on both sides of the stairs. Fix handrails that are broken or loose. Make sure that handrails are as long as the stairways.  Check any carpeting to make sure that it is firmly attached to the stairs. Fix any carpet that is loose or worn.  Avoid having throw rugs at the top or bottom of stairways, or secure the rugs with carpet tape to prevent them from moving.  Make sure that you have a light switch at the top of the stairs and the bottom of the stairs. If you do not have them, have them installed. What are some other fall prevention tips?  Wear closed-toe shoes that fit well and support your feet. Wear shoes that have rubber soles or low heels.  When you use a stepladder, make sure that it is completely opened and that the sides are firmly locked. Have someone hold the ladder while you are using   it. Do not climb a closed stepladder.  Add color or contrast paint or tape to grab bars and handrails in your home. Place contrasting color strips on the first and last steps.  Use mobility aids as needed, such as canes, walkers, scooters, and crutches.  Turn on lights if it is dark. Replace any light bulbs that burn out.  Set up furniture so that there are clear paths. Keep the furniture in the same spot.  Fix any uneven floor surfaces.  Choose a carpet design that does not hide the edge of steps of a stairway.  Be aware of any and all pets.  Review your medicines with your healthcare provider. Some medicines can cause dizziness or changes in blood pressure, which increase your risk of falling. Talk with  your health care provider about other ways that you can decrease your risk of falls. This may include working with a physical therapist or trainer to improve your strength, balance, and endurance. This information is not intended to replace advice given to you by your health care provider. Make sure you discuss any questions you have with your health care provider. Document Released: 08/10/2002 Document Revised: 01/17/2016 Document Reviewed: 09/24/2014 Elsevier Interactive Patient Education  2017 [employer]vier Inc.   Piriformis Stretch, Sitting    Sit, one ankle on opposite knee, same-side hand on crossed knee. Push down on knee, keeping spine straight. Lean torso forward, with flat back, until tension is felt in hamstrings and gluteals of crossed-leg side. Hold __30_ seconds.  Repeat with other leg.  Repeat _3__ times per session. Do _1-2__ sessions per day.  Copyright  VHI. All rights reserved.   HIP: Hamstrings - Short Sitting    Rest leg on raised surface or on floor. Keep knee straight. Lift chest. Hold __30_ seconds. Repeat on other side.  _3__ reps per set, __1-2_ sets per day, _7__ days per week  Copyright  VHI. All rights reserved.   Perform in a corner with a chair in front of you for safety OR at kitchen sink with chair behind you: Feet Together, Head Motion - Eyes Open    With eyes open, feet together, move head slowly: up and down 5 times and side to side 5 times. Repeat __3__ times per session. Do __1__ sessions per day.  Copyright  VHI. All rights reserved.  Feet Together, Varied Arm Positions - Eyes Closed    Stand with feet together and arms at your side. Close eyes and visualize upright position. Hold __30__ seconds. Repeat __3__ times per session. Do __1__ sessions per day.  Copyright  VHI. All rights reserved.   Functional Quadriceps: Sit to Stand    Sit on edge of chair, feet flat on floor. Stand upright, extending knees fully. Repeat __10__ times  per set. Do __1__ sets per session. Do __1-2__ sessions per day.  http://orth.exer.us/735   Copyright  VHI. All rights reserved.

## 2016-12-20 ENCOUNTER — Encounter: Payer: PPO | Admitting: Physical Medicine & Rehabilitation

## 2016-12-21 ENCOUNTER — Ambulatory Visit: Payer: PPO | Admitting: Speech Pathology

## 2016-12-21 DIAGNOSIS — G8222 Paraplegia, incomplete: Secondary | ICD-10-CM | POA: Diagnosis not present

## 2016-12-21 DIAGNOSIS — R41841 Cognitive communication deficit: Secondary | ICD-10-CM

## 2016-12-21 NOTE — Therapy (Signed)
[facility] [address] [place], [name], [address] Phone: [phone]   Fax:  [phone]  Speech Language Pathology Evaluation  Patient Details  Name: Katelyn Lamb MRN: [id number] Date of Birth: [birth year] Referring Provider: Dr. Floyde Parkins  Encounter Date: 12/21/2016      End of Session - 12/21/16 1248    Visit Number 1   Number of Visits 1   Date for SLP Re-Evaluation --  N/A   Authorization Type G code required   SLP Start Time 1150   SLP Stop Time  1232   SLP Time Calculation (min) 42 min   Activity Tolerance Patient tolerated treatment well      Past Medical History:  Diagnosis Date  . Anxiety   . Back pain   . Gait abnormality 11/14/2016  . Myelitis due to herpes simplex [facility])     Past Surgical History:  Procedure Laterality Date  . ABDOMINAL HYSTERECTOMY    . BLADDER REPAIR    . CESAREAN SECTION    . TUBAL LIGATION      There were no vitals filed for this visit.      Subjective Assessment - 12/21/16 1212    Subjective "I haven't had any thinking changes"   Patient is accompained by: Family member   Special Tests spouse, [name]   Currently in Pain? No/denies            SLP Evaluation [facility] - 12/21/16 1212      SLP Visit Information   SLP Received On 12/21/16   Referring Provider Dr. Floyde Parkins   Onset Date 09/09/16   Medical Diagnosis HSV myelitis     Subjective   Patient/Family Stated Goal N/A for ST     General Information   HPI 71 y.o. 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 She received CIR 10/08/16 to 10/26/16. Pt did not receive ST on acute care or CIR. She is receiving PT and OT at [facility].   Mobility Status spouse assistance, receiving PT     Prior Functional Status   Cognitive/Linguistic Baseline Within functional limits   Type of Home House    Lives With Spouse   Available Support Family   Vocation  Retired     Associate Professor   Overall Cognitive Status Within Functional Limits for tasks assessed   Attention Alternating   Alternating Attention Appears intact   Memory Appears intact   Awareness Appears intact   Problem Solving Appears intact     Auditory Comprehension   Overall Auditory Comprehension Appears within functional limits for tasks assessed     Visual Recognition/Discrimination   Discrimination Within Function Limits     Reading Comprehension   Reading Status Within funtional limits     Verbal Expression   Overall Verbal Expression Appears within functional limits for tasks assessed   Naming No impairment   Pragmatics No impairment     Written Expression   Dominant Hand Right   Written Expression Unable to assess (comment)     Oral Motor/Sensory Function   Overall Oral Motor/Sensory Function Appears within functional limits for tasks assessed     Motor Speech   Overall Motor Speech Appears within functional limits for tasks assessed     Standardized Assessments   Standardized Assessments  --  portions of MOCA  SLP Education - Jan 07, 2017 1248    Education provided Yes   Education Details rationale for referral to ST   Person(s) Educated Patient;Spouse   Methods Explanation;Demonstration   Comprehension Verbalized understanding              Plan - 07-Jan-2017 1249    Clinical Impression Statement Katelyn Lamb referred for ST to assess for cognitive changes due to HSV myelitis. She is accompanied by her spouse. Both pt and spouse deny any changes in memory, attention, concentration, problem solving, safety judegement. Neuropsychologist on CIR noted memory and attention to be [facility].  They affirm that Katelyn Lamb remembers to take her meds and remembers her appointments. Portions of the [facility] Cognitive Assessement reveal memory, reasoning and attention relatively intact. Katelyn Lamb is oriented, recalled 4/4 words with delay.  She solved functional time and money problems WFL. Reading aloud and reading comprehension of complex paragraph intact. Speech is intelligible. Pt denies difficulty chewing or swallowing. Pt is unable to write due to UE weakness/sensatoin. Katelyn Lamb provides detailed and accurate course of hospitalizations. At this time, I do not reommend skilled ST   Consulted and Agree with Plan of Care Patient;Family member/caregiver   Family Member Consulted spouse      Patient will benefit from skilled therapeutic intervention in order to improve the following deficits and impairments:   Cognitive communication deficit      G-Codes - 01/07/2017 1255    Functional Limitations Memory   Memory Current Status (G9168) 0 percent impaired, limited or restricted   Memory Goal Status (G5364) 0 percent impaired, limited or restricted   Memory Discharge Status (G9170) 0 percent impaired, limited or restricted      Problem List Patient Active Problem List   Diagnosis Date Noted  . Gait abnormality 11/14/2016  . Hypotension due to drugs   . Urinary retention   . Anxiety about health   . Reactive depression   . Ataxia   . Abdominal spasms   . Constipation due to pain medication   . Acute lower UTI   . Dysuria   . Acute deep vein thrombosis (DVT) of popliteal vein of left lower extremity ([place])   . Incomplete paraplegia ([place])   . Acute blood loss anemia   . Neurogenic bladder   . Neuropathic pain   . Muscle spasm   . Gastroesophageal reflux disease   . Slow transit constipation   . Thrombocytopenia ([place]) 10/03/2016  . Abnormal MRI, spinal cord   . Encephalomyelitis   . Numbness   . Intractable back pain 09/20/2016  . Numbness of left lower extremity 09/20/2016  . Hyponatremia 09/20/2016  . Herpes zoster without complication 68/11/2120    Merlie Noga, Annye Rusk MS, CCC-SLP Jan 07, 2017, 12:55 PM  [facility] [address] [place],  [name], [address] Phone: [phone]   Fax:  [phone]  Name: SAMANATHA Lamb MRN: [id number] Date of Birth: 02-18-1946

## 2016-12-24 ENCOUNTER — Ambulatory Visit: Payer: PPO | Admitting: Occupational Therapy

## 2016-12-24 ENCOUNTER — Ambulatory Visit: Payer: PPO | Admitting: Physical Therapy

## 2016-12-24 ENCOUNTER — Encounter: Payer: Self-pay | Admitting: Occupational Therapy

## 2016-12-24 DIAGNOSIS — R2681 Unsteadiness on feet: Secondary | ICD-10-CM

## 2016-12-24 DIAGNOSIS — R29818 Other symptoms and signs involving the nervous system: Secondary | ICD-10-CM

## 2016-12-24 DIAGNOSIS — R278 Other lack of coordination: Secondary | ICD-10-CM

## 2016-12-24 DIAGNOSIS — R41844 Frontal lobe and executive function deficit: Secondary | ICD-10-CM

## 2016-12-24 DIAGNOSIS — R482 Apraxia: Secondary | ICD-10-CM

## 2016-12-24 DIAGNOSIS — M6281 Muscle weakness (generalized): Secondary | ICD-10-CM

## 2016-12-24 DIAGNOSIS — R208 Other disturbances of skin sensation: Secondary | ICD-10-CM

## 2016-12-24 DIAGNOSIS — R2689 Other abnormalities of gait and mobility: Secondary | ICD-10-CM

## 2016-12-24 DIAGNOSIS — G8222 Paraplegia, incomplete: Secondary | ICD-10-CM | POA: Diagnosis not present

## 2016-12-24 NOTE — Patient Instructions (Signed)
Standing Marching   Using a chair if necessary, march in place. Repeat 10 times each leg. Do 1 sessions per day.  http://gt2.exer.us/344   Copyright  VHI. All rights reserved.   Hip Backward Kick   Using a chair for balance, keep legs shoulder width apart and toes pointed for- ward. Slowly extend one leg back, keeping knee straight. Do not lean forward. Repeat with other leg. Repeat 10 times each leg.  Do 1 sessions per day.  http://gt2.exer.us/340   Copyright  VHI. All rights reserved.     Hip Side Kick   Holding a chair for balance, keep legs shoulder width apart and toes pointed forward. Swing a leg out to side, keeping knee straight. Do not lean. Repeat using other leg. Repeat 10  Times each leg. Do 1  sessions per day.  http://gt2.exer.us/342   Do forward kicks - 10 reps each leg.

## 2016-12-24 NOTE — Therapy (Signed)
[facility] [address] [place], [name], [address] Phone: [phone]   Fax:  [phone]  Physical Therapy Treatment  Patient Details  Name: Katelyn Lamb MRN: [id number] Date of Birth: 08-15-1946 Referring Provider: Carlos Levering, PA-C  Encounter Date: 12/24/2016      PT End of Session - 12/24/16 2058    Visit Number 3   Number of Visits 17   Date for PT Re-Evaluation 02/04/17   Authorization Type [employer]: G-CODE AND PROGRESS NOTE EVERY 10TH VISIT.    PT Start Time 1316   PT Stop Time 1400   PT Time Calculation (min) 44 min   Equipment Utilized During Treatment Gait belt      Past Medical History:  Diagnosis Date  . Anxiety   . Back pain   . Gait abnormality 11/14/2016  . Myelitis due to herpes simplex [facility])     Past Surgical History:  Procedure Laterality Date  . ABDOMINAL HYSTERECTOMY    . BLADDER REPAIR    . CESAREAN SECTION    . TUBAL LIGATION      There were no vitals filed for this visit.      Subjective Assessment - 12/24/16 2035    Subjective Pt reports she is just having usual nerve pain today - states she feels as if she has cardboard on her thighs    Patient is accompained by: Family member   Pertinent History LLE DVT, anxiety, B cataract surgery in 2017   Patient Stated Goals Pt would like to be IND in everything (walk, feed dog, and perform ADLs safely).    Currently in Pain? No/denies                         [facility] Adult PT Treatment/Exercise - 12/24/16 1331      Transfers   Transfers Sit to Stand;Stand to Sit   Sit to Stand 5: Supervision   Number of Reps Other reps (comment)  5     Ambulation/Gait   Ambulation/Gait Yes   Ambulation/Gait Assistance 4: Min guard   Ambulation/Gait Assistance Details 2# weights used on LE's to increase proprioception   Ambulation Distance (Feet) 115 Feet  2 reps - 1 rep with weights and 1 rep without weights   Assistive device None   Gait Pattern Step-through pattern;Decreased dorsiflexion - left;Ataxic;Decreased arm swing - right;Decreased arm swing - left   Ambulation Surface Level;Indoor   Stairs Yes   Stairs Assistance 4: Min assist   Stair Management Technique Step to pattern   Number of Stairs 4   Height of Stairs 6      NeuroRe-ed:  Pt instructed in forward, back and side kicks for HEP - 10 reps each leg; marching in place 10 reps each leg; 2# placed on each leg after 1st set of 10 reps to increase proprioception in LE's for incr. balance Ankle sways with mod assist without UE support   Alternate tap ups to 1st step - with mod to min assist; pt held with RUE as able to hold onto rail Alternate stepping up/back on carpet by mat with min to mod assist        PT Education - 12/24/16 2057    Education provided Yes   Education Details added kicks (3 directions) and marching to HEP   Person(s) Educated Patient;Spouse   Methods Explanation;Demonstration;Handout   Comprehension Verbalized understanding;Returned demonstration          PT  Short Term Goals - 12/06/16 1620      PT SHORT TERM GOAL #1   Title Pt will be IND in HEP to improve strength, flexibility, and balance. TARGET DATE FOR ALL STGS: 01/03/17   Status New     PT SHORT TERM GOAL #2   Title Pt will perform TUG in </=13.5sec., IND, to decr. falls risk.   Status New     PT SHORT TERM GOAL #3   Title Pt will improve BERG score to >/=35/56 to decr. falls risk.   Status New     PT SHORT TERM GOAL #4   Title Pt will amb. 300', at MOD I level, over even terrain with LRAD to improve functional mobility.    Status New           PT Long Term Goals - 12/06/16 1622      PT LONG TERM GOAL #1   Title Pt will improve gait speed to >/=2.57ft/sec., no AD, to safely amb. in the community. TARGET DATE FOR ALL LTGS: 01/31/17   Status New     PT LONG TERM GOAL #2   Title Pt will improve BERG score to >/=45/56 to decr. falls  risk.    Status New     PT LONG TERM GOAL #3   Title Pt will amb. 600' over even/uneven terrain, at MOD I level, to progress to walking dog safely.    Status New     PT LONG TERM GOAL #4   Title Pt will verbalize understanding of fall prevention strategies to reduce falls risk.    Status New               Plan - 12/24/16 2059    Clinical Impression Statement Pt has decreased sensation in bil. feet which results in decr. somatosensory input and decreases balance.  Pt looks down to compensate for lack of sensation which limits pt's ability to scan environment.  Pt has significantly decr. standing balance, especially decr. SLS.   Rehab Potential Good   Clinical Impairments Affecting Rehab Potential see above   PT Frequency 2x / week   PT Duration 8 weeks   PT Treatment/Interventions ADLs/Self Care Home Management;Electrical Stimulation;Biofeedback;Neuromuscular re-education;Balance training;Therapeutic exercise;Therapeutic activities;Manual techniques;Functional mobility training;Stair training;Gait training;DME Instruction;Orthotic Fit/Training;Patient/family education;Vestibular   PT Next Visit Plan review balance HEP given on 12-24-13; cont balance and gait   Consulted and Agree with Plan of Care Patient;Family member/caregiver   Family Member Consulted husband: [name]      Patient will benefit from skilled therapeutic intervention in order to improve the following deficits and impairments:  Abnormal gait, Pain, Decreased knowledge of use of DME, Decreased strength, Decreased mobility, Decreased balance, Decreased range of motion, Decreased coordination, Decreased safety awareness, Impaired flexibility, Impaired sensation  Visit Diagnosis: Other abnormalities of gait and mobility     Problem List Patient Active Problem List   Diagnosis Date Noted  . Gait abnormality 11/14/2016  . Hypotension due to drugs   . Urinary retention   . Anxiety about health   . Reactive  depression   . Ataxia   . Abdominal spasms   . Constipation due to pain medication   . Acute lower UTI   . Dysuria   . Acute deep vein thrombosis (DVT) of popliteal vein of left lower extremity ([place])   . Incomplete paraplegia ([place])   . Acute blood loss anemia   . Neurogenic bladder   . Neuropathic pain   . Muscle spasm   .  Gastroesophageal reflux disease   . Slow transit constipation   . Thrombocytopenia ([place]) 10/03/2016  . Abnormal MRI, spinal cord   . Encephalomyelitis   . Numbness   . Intractable back pain 09/20/2016  . Numbness of left lower extremity 09/20/2016  . Hyponatremia 09/20/2016  . Herpes zoster without complication 76/39/4320    QVLDKC, CQFJU VQQUIVH, PT 12/24/2016, 9:10 PM  [facility] [address] [place], [name], [address] Phone: [phone]   Fax:  [phone]  Name: Katelyn Lamb MRN: [id number] Date of Birth: 1945/10/29

## 2016-12-24 NOTE — Therapy (Signed)
[facility] [address] [place], [name], [address] Phone: [phone]   Fax:  [phone]  Occupational Therapy Treatment  Patient Details  Name: Katelyn Lamb MRN: [id number] Date of Birth: 03/23/1946 Referring Provider: Carlos Levering PA-C  Encounter Date: 12/24/2016      OT End of Session - 12/24/16 1600    Visit Number 5   Number of Visits 17   Date for OT Re-Evaluation 02/04/17   Authorization Type [employer] - G code required   Authorization - Visit Number 5   Authorization - Number of Visits 10   OT Start Time 1401   OT Stop Time 1445   OT Time Calculation (min) 44 min   Activity Tolerance Patient tolerated treatment well      Past Medical History:  Diagnosis Date  . Anxiety   . Back pain   . Gait abnormality 11/14/2016  . Myelitis due to herpes simplex [facility])     Past Surgical History:  Procedure Laterality Date  . ABDOMINAL HYSTERECTOMY    . BLADDER REPAIR    . CESAREAN SECTION    . TUBAL LIGATION      There were no vitals filed for this visit.      Subjective Assessment - 12/24/16 1402    Subjective  I had that set back but I think I am doing better now   Patient is accompained by: Family member  husband   Pertinent History HSV2 encephalitis and myelitis, anxiety,    Limitations no driving, fall risk   Patient Stated Goals to be normal and get stronger   Currently in Pain? No/denies                      OT Treatments/Exercises (OP) - 12/24/16 0001      ADLs   Eating Addressed self feeding - pt with improved performance with red foam on utensils as this provides more surface and improved input to hand to function.  Pt manages a spoon better with gross grasp. Able to manage fork with built up with normal holding of utensil. Again, needs cueing to look at her hands instead of the task.  Pt issued red foam for use at home and pt and husband both verbalized  understanding.       Neurological Re-education Exercises   Other Exercises 1 Neuro re ed to address functional ambulation without a device incorporating head turns and vertical head movement with mobility.  Pt needs intermittent min a with obvious bias to R especially with head turns to the right as well as vertical head movement.  Pt was able to identify which direction she is losing her balance with extra time however balance reactions are delayed and inefficient. Pt utilizes step strategy for all LOB.  Addressed standing balance without UE support with kitchen activty requiring both UE's to be engaged in actiivity and alternating head and body turns as well as vertical head movement. ALos focused on pt's ability to use vison to compensate for sensory loss and poor motor planning with activity - pt needs cues to slow down due to impulsivitiy and to look at how hands are oriented before proceeding with activity. With practice, repetition and cueing pt improved in performance for control and for completing the activity.                    OT Short Term Goals - 12/24/16 1557      OT  SHORT TERM GOAL #1   Title Pt and husband will be mod I with home activities program - 01/05/17   Status On-going     OT SHORT TERM GOAL #2   Title Pt to feed self 75% of the time AE prn   Status On-going     OT SHORT TERM GOAL #3   Title Pt will require min a for dressing    Status On-going     OT SHORT TERM GOAL #4   Title Pt will complete sit to stand with close superivison and vc's for safety as prep for more independent functional mobility   Status On-going     OT SHORT TERM GOAL #5   Title Improve bilateral UE function as evidenced by performing Box & Blocks with at least an 8 block improvement   Status On-going     OT SHORT TERM GOAL #6   Title Pt will be demonstrate ability for simple activity in standing without UE support with minimal dynamic standing balance with close supervision.      Status On-going     OT SHORT TERM GOAL #7   Title Pt will demonstrate ability for sustained attention with no more than min vc's during functional familiar basic activity   Status On-going           OT Long Term Goals - 12/24/16 1558      OT LONG TERM GOAL #1   Title Pt and husband Independent with updated HEP (Due 02/04/17)   Status On-going     OT LONG TERM GOAL #2   Title Pt will demonstrate ability to zip and button with vc's only   Status On-going     OT LONG TERM GOAL #3   Title Pt to perform simple snack/sandwich prep S level   Status On-going     OT LONG TERM GOAL #4   Title Pt to demo sufficient UE strength as demo by folding laundry for 10 minutes w/o rest   Status On-going     OT LONG TERM GOAL #5   Title Pt to improve grip strength by 10 lbs or greater from initial eval   Status On-going     OT LONG TERM GOAL #6   Title Pt will require no more than supervision for dressing    Status On-going     OT LONG TERM GOAL #7   Title Pt will be supervision for toilet transfers   Status On-going     OT LONG TERM GOAL #8   Title Pt will be supervision for shower transfers   Status On-going     OT LONG TERM GOAL  #9   Baseline Pt will demonstrate ability for sustained/selective attention for familar functional task with no more than 2 vc's.    Status On-going               Plan - 12/24/16 1558    Clinical Impression Statement Pt working toward goals. Pt with improvement in balance and ability to attend during functional tasks.    Rehab Potential Fair   Clinical Impairments Affecting Rehab Potential apraxia, cognitive deficits, ataxia, severe sensory impairment from chest level down.    OT Frequency 2x / week   OT Duration 8 weeks   OT Treatment/Interventions Self-care/ADL training;Moist Heat;Fluidtherapy;DME and/or AE instruction;Patient/family education;Splinting;Therapeutic activities;Neuromuscular education;Functional Mobility Training;Passive range of  motion;Cognitive remediation/compensation;Visual/perceptual remediation/compensation;Manual Therapy;Therapeutic exercise;Balance training   Plan transitional movements, closed chain, small controlled movement, UE functional use using vision to compensate, decreased  impulsivity and increase attention to UE's use, balance, functional mobility   Consulted and Agree with Plan of Care Patient;Family member/caregiver   Family Member Consulted husband      Patient will benefit from skilled therapeutic intervention in order to improve the following deficits and impairments:  Abnormal gait, Decreased activity tolerance, Decreased balance, Decreased cognition, Decreased coordination, Decreased safety awareness, Decreased mobility, Decreased strength, Difficulty walking, Impaired UE functional use, Impaired tone, Impaired sensation, Impaired vision/preception, Pain  Visit Diagnosis: Other lack of coordination  Unsteadiness on feet  Muscle weakness (generalized)  Other symptoms and signs involving the nervous system  Apraxia  Other disturbances of skin sensation  Frontal lobe and executive function deficit    Problem List Patient Active Problem List   Diagnosis Date Noted  . Gait abnormality 11/14/2016  . Hypotension due to drugs   . Urinary retention   . Anxiety about health   . Reactive depression   . Ataxia   . Abdominal spasms   . Constipation due to pain medication   . Acute lower UTI   . Dysuria   . Acute deep vein thrombosis (DVT) of popliteal vein of left lower extremity ([place])   . Incomplete paraplegia ([place])   . Acute blood loss anemia   . Neurogenic bladder   . Neuropathic pain   . Muscle spasm   . Gastroesophageal reflux disease   . Slow transit constipation   . Thrombocytopenia ([place]) 10/03/2016  . Abnormal MRI, spinal cord   . Encephalomyelitis   . Numbness   . Intractable back pain 09/20/2016  . Numbness of left lower extremity 09/20/2016  . Hyponatremia  09/20/2016  . Herpes zoster without complication 21/22/4825    Quay Burow , OTR/L 12/24/2016, 4:01 PM  [facility] [address] [place], [name], [address] Phone: [phone]   Fax:  [phone]  Name: TRAVONNA SWINDLE MRN: [id number] Date of Birth: 20-Jun-1946

## 2016-12-25 ENCOUNTER — Other Ambulatory Visit: Payer: PPO

## 2016-12-25 ENCOUNTER — Ambulatory Visit
Admission: RE | Admit: 2016-12-25 | Discharge: 2016-12-25 | Disposition: A | Discharge status: Home / Self Care | Payer: PPO | Source: Ambulatory Visit | Attending: Neurology | Admitting: Neurology

## 2016-12-25 DIAGNOSIS — G0490 Encephalitis and encephalomyelitis, unspecified: Secondary | ICD-10-CM | POA: Diagnosis not present

## 2016-12-25 DIAGNOSIS — G8222 Paraplegia, incomplete: Secondary | ICD-10-CM

## 2016-12-25 DIAGNOSIS — R531 Weakness: Secondary | ICD-10-CM | POA: Diagnosis not present

## 2016-12-25 DIAGNOSIS — M50221 Other cervical disc displacement at C4-C5 level: Secondary | ICD-10-CM | POA: Diagnosis not present

## 2016-12-25 MED ORDER — GADOBENATE DIMEGLUMINE 529 MG/ML IV SOLN: 12.0000 mL | Freq: Once | INTRAVENOUS | Status: DC | PRN

## 2016-12-26 ENCOUNTER — Other Ambulatory Visit: Payer: Self-pay | Admitting: Neurology

## 2016-12-26 DIAGNOSIS — G8222 Paraplegia, incomplete: Secondary | ICD-10-CM

## 2016-12-26 DIAGNOSIS — G0490 Encephalitis and encephalomyelitis, unspecified: Secondary | ICD-10-CM

## 2016-12-26 NOTE — Telephone Encounter (Signed)
The acyclovir will be reduced to 400 mg taking 1 twice daily as maintenance suppressive therapy.

## 2016-12-27 ENCOUNTER — Ambulatory Visit: Payer: PPO | Admitting: Physical Therapy

## 2016-12-27 ENCOUNTER — Ambulatory Visit: Payer: PPO | Admitting: Occupational Therapy

## 2016-12-27 DIAGNOSIS — R482 Apraxia: Secondary | ICD-10-CM

## 2016-12-27 DIAGNOSIS — R278 Other lack of coordination: Secondary | ICD-10-CM

## 2016-12-27 DIAGNOSIS — R29818 Other symptoms and signs involving the nervous system: Secondary | ICD-10-CM

## 2016-12-27 DIAGNOSIS — R2681 Unsteadiness on feet: Secondary | ICD-10-CM

## 2016-12-27 DIAGNOSIS — G8222 Paraplegia, incomplete: Secondary | ICD-10-CM | POA: Diagnosis not present

## 2016-12-27 DIAGNOSIS — R2689 Other abnormalities of gait and mobility: Secondary | ICD-10-CM

## 2016-12-27 NOTE — Therapy (Signed)
[facility] [address] [place], [name], [address] Phone: [phone]   Fax:  [phone]  Occupational Therapy Treatment  Patient Details  Name: Katelyn Lamb MRN: [id number] Date of Birth: April 16, 1946 Referring Provider: Carlos Levering PA-C  Encounter Date: 12/27/2016      OT End of Session - 12/27/16 1203    Visit Number 6   Number of Visits 17   Date for OT Re-Evaluation 02/04/17   Authorization Type [employer] - G code required   Authorization - Visit Number 6   Authorization - Number of Visits 10   OT Start Time 0160   OT Stop Time 1100   OT Time Calculation (min) 45 min   Activity Tolerance Patient tolerated treatment well      Past Medical History:  Diagnosis Date  . Anxiety   . Back pain   . Gait abnormality 11/14/2016  . Myelitis due to herpes simplex [facility])     Past Surgical History:  Procedure Laterality Date  . ABDOMINAL HYSTERECTOMY    . BLADDER REPAIR    . CESAREAN SECTION    . TUBAL LIGATION      There were no vitals filed for this visit.      Subjective Assessment - 12/27/16 1016    Subjective  I put on some make up today   Pertinent History HSV2 encephalitis and myelitis, anxiety,    Limitations no driving, fall risk   Patient Stated Goals to be normal and get stronger   Currently in Pain? Yes  low back (premorbid) - not addressing                      OT Treatments/Exercises (OP) - 12/27/16 0001      Neurological Re-education Exercises   Other Exercises 1 Neuro re-education to address motor planning, bilateral integration, compensations for sensory loss bilaterally during functional tasks including: donning/doffing socks and shoes including partial typing; getting objects in/out of high and low cabinets bilaterally, pouring cup of water from 1 cup to another over sink with 1 spill; flipping cards over (alternating hands), and stringing large wooden  beads. Pt overall with mod difficulty, several drops, and cues required to slow down and use vision to look at hands. Pt improved with repetition and slowing down. Pt occasionally had to stop activity altogether and begin again.    Other Exercises 2 Pt stood for retrieving things in/out of high and low cabinets for above activity to address balance: pt had no LOB with close supervision                  OT Short Term Goals - 12/24/16 1557      OT SHORT TERM GOAL #1   Title Pt and husband will be mod I with home activities program - 01/05/17   Status On-going     OT SHORT TERM GOAL #2   Title Pt to feed self 75% of the time AE prn   Status On-going     OT SHORT TERM GOAL #3   Title Pt will require min a for dressing    Status On-going     OT SHORT TERM GOAL #4   Title Pt will complete sit to stand with close superivison and vc's for safety as prep for more independent functional mobility   Status On-going     OT SHORT TERM GOAL #5   Title Improve bilateral UE function as evidenced by performing Box &  Blocks with at least an 8 block improvement   Status On-going     OT SHORT TERM GOAL #6   Title Pt will be demonstrate ability for simple activity in standing without UE support with minimal dynamic standing balance with close supervision.     Status On-going     OT SHORT TERM GOAL #7   Title Pt will demonstrate ability for sustained attention with no more than min vc's during functional familiar basic activity   Status On-going           OT Long Term Goals - 12/24/16 1558      OT LONG TERM GOAL #1   Title Pt and husband Independent with updated HEP (Due 02/04/17)   Status On-going     OT LONG TERM GOAL #2   Title Pt will demonstrate ability to zip and button with vc's only   Status On-going     OT LONG TERM GOAL #3   Title Pt to perform simple snack/sandwich prep S level   Status On-going     OT LONG TERM GOAL #4   Title Pt to demo sufficient UE strength as  demo by folding laundry for 10 minutes w/o rest   Status On-going     OT LONG TERM GOAL #5   Title Pt to improve grip strength by 10 lbs or greater from initial eval   Status On-going     OT LONG TERM GOAL #6   Title Pt will require no more than supervision for dressing    Status On-going     OT LONG TERM GOAL #7   Title Pt will be supervision for toilet transfers   Status On-going     OT LONG TERM GOAL #8   Title Pt will be supervision for shower transfers   Status On-going     OT LONG TERM GOAL  #9   Baseline Pt will demonstrate ability for sustained/selective attention for familar functional task with no more than 2 vc's.    Status On-going               Plan - 12/27/16 1204    Clinical Impression Statement Pt making slow but steady progress towards goals. Pt improves with cues to slow down and use vision for functional tasks using UE's. Pt with increased balance today with head turns to Rt.    Rehab Potential Fair   Clinical Impairments Affecting Rehab Potential apraxia, cognitive deficits, ataxia, severe sensory impairment from chest level down.    OT Frequency 2x / week   OT Duration 8 weeks   OT Treatment/Interventions Self-care/ADL training;Moist Heat;Fluidtherapy;DME and/or AE instruction;Patient/family education;Splinting;Therapeutic activities;Neuromuscular education;Functional Mobility Training;Passive range of motion;Cognitive remediation/compensation;Visual/perceptual remediation/compensation;Manual Therapy;Therapeutic exercise;Balance training   Plan continue to address apraxia and coordination during functional tasks, balance during functional tasks   Consulted and Agree with Plan of Care Patient;Family member/caregiver   Family Member Consulted husband      Patient will benefit from skilled therapeutic intervention in order to improve the following deficits and impairments:     Visit Diagnosis: Other lack of coordination  Apraxia  Unsteadiness on  feet  Other symptoms and signs involving the nervous system    Problem List Patient Active Problem List   Diagnosis Date Noted  . Gait abnormality 11/14/2016  . Hypotension due to drugs   . Urinary retention   . Anxiety about health   . Reactive depression   . Ataxia   . Abdominal spasms   . Constipation  due to pain medication   . Acute lower UTI   . Dysuria   . Acute deep vein thrombosis (DVT) of popliteal vein of left lower extremity ([place])   . Incomplete paraplegia ([place])   . Acute blood loss anemia   . Neurogenic bladder   . Neuropathic pain   . Muscle spasm   . Gastroesophageal reflux disease   . Slow transit constipation   . Thrombocytopenia ([place]) 10/03/2016  . Abnormal MRI, spinal cord   . Encephalomyelitis   . Numbness   . Intractable back pain 09/20/2016  . Numbness of left lower extremity 09/20/2016  . Hyponatremia 09/20/2016  . Herpes zoster without complication 09/81/1914    Carey Bullocks, OTR/L 12/27/2016, 12:06 PM  [facility] [address] [place], [name], [address] Phone: [phone]   Fax:  [phone]  Name: KEILA TURAN MRN: [id number] Date of Birth: [birth year]

## 2016-12-28 ENCOUNTER — Telehealth: Payer: Self-pay | Admitting: Neurology

## 2016-12-28 NOTE — Telephone Encounter (Signed)
  I called the patient and talked with the patient and her husband. There is a possible new lesion at the T1-3 level, this could correlate with her new symptoms of leg sensory alteration.  The patient is remaining on acyclovir. She will be seeing infectious disease physician on 01/08/2017.  MRI the brain showed no change.  MRI cervical 12/28/16:  IMPRESSION:  Abnormal MRI cervical spine (with and without) demonstrating:  1. There are 3 stable / improving areas of spinal cord T2 hyperintensity at the cervico-medullary junction and within the posterior spinal cord from C2-C4. There is a small area of enhancement at the C3 level, measuring 53mm in cranio-caudal dimension, which has decreased in size compared to prior study. These findings are compatible with autoimmune, inflammatory or para-infectious etiologies.   2. There is a possibly new T2STIR hyperintense signal within the spinal cord at T1-T3 posteriorly on sagittal views (series 14, image 5), but this area is not within field of view on axial views. This area does not have contrast enhancement. This appears to be a new finding compared to MRI on 10/03/16, and could represent new focus of inflammation vs technical / motion artifact.   3. At C5-6: disc bulging and disc protrusion with mild spinal stenosis and severe biforaminal stenosis.  4. At C4-5: disc bulging and uncovertebral joint hypertrophy with mild right and moderate left foraminal stenosis.  5. C6-7: disc bulging with moderate right foraminal stenosis.  6. Compared to prior MRI on 10/03/16, the extent of spinal cord T2 hyperintensity and enhancement at C2-C4 and C6 have decreased in the current study. There may be new area of spinal cord T2STIR hyperintensity from T1-T3 (but non-enhancing), which may reflect new new focus of inflammation vs a technical / motion artifact finding. Consider follow up with MRI thoracic spine (with and without).    MRI brain  12/28/16:  IMPRESSION:  Abnormal MRI brain (with and without) demonstrating: 1. Moderate periventricular and subcortical and pontine chronic small vessel ischemic disease.  2. No abnormal lesions are seen on post contrast views.   3. Compared to MRI on 10/03/16, no new findings. In addition, prior focus of enhancement in left paramedian frontal region has resolved.

## 2016-12-28 NOTE — Therapy (Signed)
[facility] [address] [place], [name], [address] Phone: [phone]   Fax:  [phone]  Physical Therapy Treatment  Patient Details  Name: Katelyn Lamb MRN: [id number] Date of Birth: [birth year] Referring Provider: Carlos Levering, PA-C  Encounter Date: 12/27/2016      PT End of Session - 12/28/16 1117    Visit Number 4   Number of Visits 17   Date for PT Re-Evaluation 02/04/17   Authorization Type [employer]: G-CODE AND PROGRESS NOTE EVERY 10TH VISIT.    PT Start Time 1110   PT Stop Time 1153   PT Time Calculation (min) 43 min   Equipment Utilized During Treatment Gait belt      Past Medical History:  Diagnosis Date  . Anxiety   . Back pain   . Gait abnormality 11/14/2016  . Myelitis due to herpes simplex [facility])     Past Surgical History:  Procedure Laterality Date  . ABDOMINAL HYSTERECTOMY    . BLADDER REPAIR    . CESAREAN SECTION    . TUBAL LIGATION      There were no vitals filed for this visit.      Subjective Assessment - 12/28/16 1102    Subjective Pt reports no changes or problems since previous PT session   Patient is accompained by: Family member   Pertinent History LLE DVT, anxiety, B cataract surgery in 2017   Patient Stated Goals Pt would like to be IND in everything (walk, feed dog, and perform ADLs safely).    Currently in Pain? No/denies                         [facility] Adult PT Treatment/Exercise - 12/28/16 0001      Transfers   Transfers Sit to Stand;Stand to Sit   Sit to Stand 5: Supervision   Number of Reps Other reps (comment)  5   Comments no UE support used     Ambulation/Gait   Ambulation/Gait Yes   Ambulation/Gait Assistance 4: Min guard   Ambulation/Gait Assistance Details pt had 2 occurrences of LE's scissoring with curve negotiation on track during gait training - - min assist needed for  balance recovery   Ambulation Distance (Feet) 350  Feet   Assistive device None   Gait Pattern Step-through pattern;Decreased dorsiflexion - left;Ataxic;Decreased arm swing - right;Decreased arm swing - left   Ambulation Surface Level;Indoor   Stairs Yes   Stairs Assistance 4: Min guard   Stair Management Technique Step to pattern   Number of Stairs 4   Height of Stairs 6     Knee/Hip Exercises: Standing   Forward Step Up Both;1 set;10 reps;Step Height: 6"  CGA for balance             Balance Exercises - 12/28/16 1113      Balance Exercises: Standing   Rockerboard Anterior/posterior;EO;30 seconds  inside // bars   Sidestepping 2 reps  inside // bars   Other Standing Exercises Marching in place - cues to decr. speed; 10 reps each leg;  marching forwards and backwards inside // bars; pt performed crossovers 10' x 4 reps insdie // bars; performed stepping behind 10' x 2 reps inside // bars  with CGA      NeuroRe-ed;  Pt performed SLS activity using blue ladder on floor - 4 reps with min assist with cues to decr. Speed  Stepping over and back of balance beam - 10  reps each leg for improved SLS with weight shift Alternate stepping up/back on incline 10 reps each leg and then down/back on decline 10 reps each leg with min assist  Marching on incline/decline with head turns horizontal and vertical with min to mod assist for balance recovery         PT Short Term Goals - 12/06/16 1620      PT SHORT TERM GOAL #1   Title Pt will be IND in HEP to improve strength, flexibility, and balance. TARGET DATE FOR ALL STGS: 01/03/17   Status New     PT SHORT TERM GOAL #2   Title Pt will perform TUG in </=13.5sec., IND, to decr. falls risk.   Status New     PT SHORT TERM GOAL #3   Title Pt will improve BERG score to >/=35/56 to decr. falls risk.   Status New     PT SHORT TERM GOAL #4   Title Pt will amb. 300', at MOD I level, over even terrain with LRAD to improve functional mobility.    Status New           PT Long Term  Goals - 12/06/16 1622      PT LONG TERM GOAL #1   Title Pt will improve gait speed to >/=2.87ft/sec., no AD, to safely amb. in the community. TARGET DATE FOR ALL LTGS: 01/31/17   Status New     PT LONG TERM GOAL #2   Title Pt will improve BERG score to >/=45/56 to decr. falls risk.    Status New     PT LONG TERM GOAL #3   Title Pt will amb. 600' over even/uneven terrain, at MOD I level, to progress to walking dog safely.    Status New     PT LONG TERM GOAL #4   Title Pt will verbalize understanding of fall prevention strategies to reduce falls risk.    Status New               Plan - 12/28/16 1118    Clinical Impression Statement Pt's balance is affected by decr. sensation in feet, necessitating to look down rather than looking straight ahead during ambulation/gait training.  Pt requires min assist occasionally for balance recovery due to decr. SLS and occasional scissoring of LE's during gait                                                                            Rehab Potential Good   Clinical Impairments Affecting Rehab Potential see above   PT Frequency 2x / week   PT Duration 8 weeks   PT Treatment/Interventions ADLs/Self Care Home Management;Electrical Stimulation;Biofeedback;Neuromuscular re-education;Balance training;Therapeutic exercise;Therapeutic activities;Manual techniques;Functional mobility training;Stair training;Gait training;DME Instruction;Orthotic Fit/Training;Patient/family education;Vestibular   PT Next Visit Plan   Begin checking STG's - due 01-03-17:  Cont balance and gait training   Consulted and Agree with Plan of Care Patient;Family member/caregiver   Family Member Consulted husband: [name]      Patient will benefit from skilled therapeutic intervention in order to improve the following deficits and impairments:  Abnormal gait, Pain, Decreased knowledge of use of DME, Decreased strength, Decreased mobility, Decreased balance, Decreased range of  motion, Decreased  coordination, Decreased safety awareness, Impaired flexibility, Impaired sensation  Visit Diagnosis: Unsteadiness on feet  Other abnormalities of gait and mobility     Problem List Patient Active Problem List   Diagnosis Date Noted  . Gait abnormality 11/14/2016  . Hypotension due to drugs   . Urinary retention   . Anxiety about health   . Reactive depression   . Ataxia   . Abdominal spasms   . Constipation due to pain medication   . Acute lower UTI   . Dysuria   . Acute deep vein thrombosis (DVT) of popliteal vein of left lower extremity ([place])   . Incomplete paraplegia ([place])   . Acute blood loss anemia   . Neurogenic bladder   . Neuropathic pain   . Muscle spasm   . Gastroesophageal reflux disease   . Slow transit constipation   . Thrombocytopenia ([place]) 10/03/2016  . Abnormal MRI, spinal cord   . Encephalomyelitis   . Numbness   . Intractable back pain 09/20/2016  . Numbness of left lower extremity 09/20/2016  . Hyponatremia 09/20/2016  . Herpes zoster without complication 82/51/8984    Alda Lea, PT 12/28/2016, 11:23 AM  [facility] [address] [place], [name], [address] Phone: [phone]   Fax:  [phone]  Name: Katelyn Lamb MRN: [id number] Date of Birth: Jan 30, 1946

## 2017-01-01 ENCOUNTER — Ambulatory Visit: Payer: PPO | Attending: Family Medicine | Admitting: Occupational Therapy

## 2017-01-01 ENCOUNTER — Ambulatory Visit: Payer: PPO

## 2017-01-01 DIAGNOSIS — R482 Apraxia: Secondary | ICD-10-CM

## 2017-01-01 DIAGNOSIS — R208 Other disturbances of skin sensation: Secondary | ICD-10-CM

## 2017-01-01 DIAGNOSIS — R2681 Unsteadiness on feet: Secondary | ICD-10-CM | POA: Diagnosis not present

## 2017-01-01 DIAGNOSIS — M6281 Muscle weakness (generalized): Secondary | ICD-10-CM | POA: Insufficient documentation

## 2017-01-01 DIAGNOSIS — R278 Other lack of coordination: Secondary | ICD-10-CM | POA: Diagnosis not present

## 2017-01-01 DIAGNOSIS — R2689 Other abnormalities of gait and mobility: Secondary | ICD-10-CM

## 2017-01-01 DIAGNOSIS — R29818 Other symptoms and signs involving the nervous system: Secondary | ICD-10-CM | POA: Diagnosis not present

## 2017-01-01 DIAGNOSIS — R41844 Frontal lobe and executive function deficit: Secondary | ICD-10-CM | POA: Insufficient documentation

## 2017-01-01 NOTE — Therapy (Signed)
[facility] [address] [place], [name], [address] Phone: [phone]   Fax:  [phone]  Occupational Therapy Treatment  Patient Details  Name: Katelyn Lamb MRN: [id number] Date of Birth: February 22, 1946 Referring Provider: Carlos Levering PA-C  Encounter Date: 01/01/2017      OT End of Session - 01/01/17 1425    Visit Number 7   Number of Visits 17   Date for OT Re-Evaluation 02/04/17   Authorization Type [employer] - G code required   Authorization - Visit Number 7   Authorization - Number of Visits 10   OT Start Time 6962   OT Stop Time 1403   OT Time Calculation (min) 45 min   Activity Tolerance Patient tolerated treatment well      Past Medical History:  Diagnosis Date  . Anxiety   . Back pain   . Gait abnormality 11/14/2016  . Myelitis due to herpes simplex [facility])     Past Surgical History:  Procedure Laterality Date  . ABDOMINAL HYSTERECTOMY    . BLADDER REPAIR    . CESAREAN SECTION    . TUBAL LIGATION      There were no vitals filed for this visit.      Subjective Assessment - 01/01/17 1353    Pertinent History HSV2 encephalitis and myelitis, anxiety,    Limitations no driving, fall risk   Patient Stated Goals to be normal and get stronger   Currently in Pain? Yes   Pain Score 2    Pain Location Arm   Pain Orientation Left   Pain Type Neuropathic pain   Pain Onset More than a month ago   Pain Frequency Intermittent   Aggravating Factors  moving it   Pain Relieving Factors meds, ice                      OT Treatments/Exercises (OP) - 01/01/17 0001      ADLs   Eating Simulated eating while picking up beans with spoon using Rt hand. Pt did very well w/ no drops in reasonable amt of time.    UB Dressing Spent the majority of the time working on UB dressing: Pt still required min to mod assist with doffing pull over shirt, and min assist to don shirt w/ adaptive  strategies/techniques. Pt required mod assist to doff open sweater. Pt demo decr. motor learning and ability to problem solve even after repetition. Pt required continual cueing to slow down and look at hand placement before performing task. Attempted hooking/unhooking small buttons but pt uanble, therefore d/c task. Pt instructed to attempt to work on at home with larger buttons   ADL Comments Began assessing STG's                  OT Short Term Goals - 01/01/17 1426      OT SHORT TERM GOAL #1   Title Pt and husband will be mod I with home activities program - 01/05/17   Status On-going     OT SHORT TERM GOAL #2   Title Pt to feed self 75% of the time AE prn   Status Achieved     OT SHORT TERM GOAL #3   Title Pt will require min a for dressing    Status On-going  01/01/17: currently mod assist for UB dressing, dependent for tying shoes     OT SHORT TERM GOAL #4   Title Pt will complete sit to  stand with close superivison and vc's for safety as prep for more independent functional mobility   Status Achieved     OT SHORT TERM GOAL #5   Title Improve bilateral UE function as evidenced by performing Box & Blocks with at least an 8 block improvement   Baseline eval: Rt = 17, Lt = 10    Status On-going     OT SHORT TERM GOAL #6   Title Pt will be demonstrate ability for simple activity in standing without UE support with minimal dynamic standing balance with close supervision.     Status On-going     OT SHORT TERM GOAL #7   Title Pt will demonstrate ability for sustained attention with no more than min vc's during functional familiar basic activity   Status On-going           OT Long Term Goals - 12/24/16 1558      OT LONG TERM GOAL #1   Title Pt and husband Independent with updated HEP (Due 02/04/17)   Status On-going     OT LONG TERM GOAL #2   Title Pt will demonstrate ability to zip and button with vc's only   Status On-going     OT LONG TERM GOAL #3   Title Pt  to perform simple snack/sandwich prep S level   Status On-going     OT LONG TERM GOAL #4   Title Pt to demo sufficient UE strength as demo by folding laundry for 10 minutes w/o rest   Status On-going     OT LONG TERM GOAL #5   Title Pt to improve grip strength by 10 lbs or greater from initial eval   Status On-going     OT LONG TERM GOAL #6   Title Pt will require no more than supervision for dressing    Status On-going     OT LONG TERM GOAL #7   Title Pt will be supervision for toilet transfers   Status On-going     OT LONG TERM GOAL #8   Title Pt will be supervision for shower transfers   Status On-going     OT LONG TERM GOAL  #9   Baseline Pt will demonstrate ability for sustained/selective attention for familar functional task with no more than 2 vc's.    Status On-going               Plan - 01/01/17 1427    Clinical Impression Statement Pt demo decr. problem solving to correct hand placement during basic familiar tasks and requires continual cueing to look at hands and achieve proper hand placement before attempting task   Clinical Impairments Affecting Rehab Potential apraxia, cognitive deficits, ataxia, severe sensory impairment from chest level down.    OT Frequency 2x / week   OT Duration 8 weeks   OT Treatment/Interventions Self-care/ADL training;Moist Heat;Fluidtherapy;DME and/or AE instruction;Patient/family education;Splinting;Therapeutic activities;Neuromuscular education;Functional Mobility Training;Passive range of motion;Cognitive remediation/compensation;Visual/perceptual remediation/compensation;Manual Therapy;Therapeutic exercise;Balance training   Plan assess remaining STG's, work on dynamic standing w/ functional reaching   Consulted and Agree with Plan of Care Patient;Family member/caregiver   Family Member Consulted husband      Patient will benefit from skilled therapeutic intervention in order to improve the following deficits and impairments:   Abnormal gait, Decreased activity tolerance, Decreased balance, Decreased cognition, Decreased coordination, Decreased safety awareness, Decreased mobility, Decreased strength, Difficulty walking, Impaired UE functional use, Impaired tone, Impaired sensation, Impaired vision/preception, Pain  Visit Diagnosis: Apraxia  Other lack of  coordination  Other disturbances of skin sensation  Other symptoms and signs involving the nervous system  Frontal lobe and executive function deficit    Problem List Patient Active Problem List   Diagnosis Date Noted  . Gait abnormality 11/14/2016  . Hypotension due to drugs   . Urinary retention   . Anxiety about health   . Reactive depression   . Ataxia   . Abdominal spasms   . Constipation due to pain medication   . Acute lower UTI   . Dysuria   . Acute deep vein thrombosis (DVT) of popliteal vein of left lower extremity ([place])   . Incomplete paraplegia ([place])   . Acute blood loss anemia   . Neurogenic bladder   . Neuropathic pain   . Muscle spasm   . Gastroesophageal reflux disease   . Slow transit constipation   . Thrombocytopenia ([place]) 10/03/2016  . Abnormal MRI, spinal cord   . Encephalomyelitis   . Numbness   . Intractable back pain 09/20/2016  . Numbness of left lower extremity 09/20/2016  . Hyponatremia 09/20/2016  . Herpes zoster without complication 36/14/4315    Carey Bullocks, OTR/L 01/01/2017, 2:30 PM  [facility] [address] [place], [name], [address] Phone: [phone]   Fax:  [phone]  Name: Katelyn Lamb MRN: [id number] Date of Birth: 21-Aug-1946

## 2017-01-01 NOTE — Therapy (Addendum)
[facility] [address] [place], [name], [address] Phone: [phone]   Fax:  [phone]  Physical Therapy Treatment  Patient Details  Name: Katelyn Lamb MRN: [id number] Date of Birth: [birth year] Referring Provider: Carlos Levering, PA-C  Encounter Date: 01/01/2017      PT End of Session - 01/01/17 1546    Visit Number 5   Number of Visits 17   Date for PT Re-Evaluation 02/04/17   Authorization Type [employer]: G-CODE AND PROGRESS NOTE EVERY 10TH VISIT.    PT Start Time 1405  Pt with OT   PT Stop Time 1450   PT Time Calculation (min) 45 min   Equipment Utilized During Treatment Gait belt   Activity Tolerance Patient tolerated treatment well   Behavior During Therapy WFL for tasks assessed/performed      Past Medical History:  Diagnosis Date  . Anxiety   . Back pain   . Gait abnormality 11/14/2016  . Myelitis due to herpes simplex [facility])     Past Surgical History:  Procedure Laterality Date  . ABDOMINAL HYSTERECTOMY    . BLADDER REPAIR    . CESAREAN SECTION    . TUBAL LIGATION      There were no vitals filed for this visit.      Subjective Assessment - 01/01/17 1406    Subjective Pt reported 2 falls since last visit. Pt reported the falls occurred when trying to get her headphones while using her flatiron to grab the headphones. Pt stated her buttocks bore the brunt of the fall. She also fell when in bathroom when trying to sit on the commode, as she didn't want to wake her husband for help. Pt denied hitting head or other injuries.    Patient is accompained by: Family member  Steve-husband   Pertinent History LLE DVT, anxiety, B cataract surgery in 2017   Patient Stated Goals Pt would like to be IND in everything (walk, feed dog, and perform ADLs safely).    Currently in Pain? Yes   Pain Score 2    Pain Location Arm   Pain Orientation Left   Pain Descriptors / Indicators Pins and needles    Pain Type Neuropathic pain   Pain Onset More than a month ago   Pain Frequency Intermittent   Aggravating Factors  moving it   Pain Relieving Factors meds, ice                         [facility] Adult PT Treatment/Exercise - 01/01/17 1415      Ambulation/Gait   Ambulation/Gait Yes   Ambulation/Gait Assistance 5: Supervision;4: Min guard   Ambulation/Gait Assistance Details Pt with intermittent LOB episodes, pt utilized stepping strategy to correct balance. Cues to improve stride length and improve narrow BOS.   Ambulation Distance (Feet) 345 Feet   Assistive device None   Gait Pattern Step-through pattern;Decreased dorsiflexion - left;Ataxic;Decreased arm swing - right;Decreased arm swing - left   Ambulation Surface Level;Indoor   Pre-Gait Activities B hip marches in standing with B UEs on RW x10 reps. Pt's L hand did come off of RW 2/2 decr. sensation. PT assessed sensation during session and pt was able to indicate each light touch to B hands.     Standardized Balance Assessment   Standardized Balance Assessment Berg Balance Test;Timed Up and Go Test     Berg Balance Test   Sit to Stand Able to stand without using  hands and stabilize independently   Standing Unsupported Able to stand safely 2 minutes   Sitting with Back Unsupported but Feet Supported on Floor or Stool Able to sit safely and securely 2 minutes   Stand to Sit Sits safely with minimal use of hands   Transfers Able to transfer with verbal cueing and /or supervision  from lower surface (chair)   Standing Unsupported with Eyes Closed Able to stand 10 seconds safely   Standing Ubsupported with Feet Together Needs help to attain position but able to stand for 30 seconds with feet together   From Standing, Reach Forward with Outstretched Arm Can reach forward >12 cm safely (5")  7"   From Standing Position, Pick up Object from Floor Able to pick up shoe, needs supervision   From Standing Position, Turn to  Look Behind Over each Shoulder Looks behind one side only/other side shows less weight shift   Turn 360 Degrees Able to turn 360 degrees safely but slowly   Standing Unsupported, Alternately Place Feet on Step/Stool Able to complete >2 steps/needs minimal assist   Standing Unsupported, One Foot in Front Able to take small step independently and hold 30 seconds   Standing on One Leg Tries to lift leg/unable to hold 3 seconds but remains standing independently   Total Score 38     Timed Up and Go Test   TUG Normal TUG   Normal TUG (seconds) 13.5  no AD     Pt required rest breaks during session 2/2 fatigue.       Self Care:      PT Education - 01/01/17 1545    Education provided Yes   Education Details PT discussed goal progress and outcome measure results. PT educated pt on the importance of attempting to amb. with RW again for safety (pt has fallen twice since last visit), as pt was able to detect light touch sensation, during testing, with eyes closed.  PT also educated pt on STS txf technique.    Person(s) Educated Patient;Spouse   Methods Explanation   Comprehension Verbalized understanding;Need further instruction          PT Short Term Goals - 01/01/17 1548      PT SHORT TERM GOAL #1   Title Pt will be IND in HEP to improve strength, flexibility, and balance. TARGET DATE FOR ALL STGS: 01/03/17   Status New     PT SHORT TERM GOAL #2   Title Pt will perform TUG in </=13.5sec., IND, to decr. falls risk.   Status Achieved     PT SHORT TERM GOAL #3   Title Pt will improve BERG score to >/=35/56 to decr. falls risk.   Status Achieved     PT SHORT TERM GOAL #4   Title Pt will amb. 300', at MOD I level, over even terrain with LRAD to improve functional mobility.    Status Partially Met           PT Long Term Goals - 12/06/16 1622      PT LONG TERM GOAL #1   Title Pt will improve gait speed to >/=2.70f/sec., no AD, to safely amb. in the community. TARGET DATE FOR  ALL LTGS: 01/31/17   Status New     PT LONG TERM GOAL #2   Title Pt will improve BERG score to >/=45/56 to decr. falls risk.    Status New     PT LONG TERM GOAL #3   Title Pt will amb. 600'  over even/uneven terrain, at MOD I level, to progress to walking dog safely.    Status New     PT LONG TERM GOAL #4   Title Pt will verbalize understanding of fall prevention strategies to reduce falls risk.    Status New               Plan - 01/01/17 1546    Clinical Impression Statement Pt demonstrated progress, as she met STGs 2 and 3. Pt did not meet STG 4, as she required min guard-S during amb. 2/2 LOB episodes and gait deviations. Pt would benefit from trialing amb. with RW to improve balance, gait deviations and safety during gait. Pt was able to detect light touch sensation, with eyes closed, during testing. B grip was also fair. Continue with POC.    Rehab Potential Good   Clinical Impairments Affecting Rehab Potential see above   PT Frequency 2x / week   PT Duration 8 weeks   PT Treatment/Interventions ADLs/Self Care Home Management;Electrical Stimulation;Biofeedback;Neuromuscular re-education;Balance training;Therapeutic exercise;Therapeutic activities;Manual techniques;Functional mobility training;Stair training;Gait training;DME Instruction;Orthotic Fit/Training;Patient/family education;Vestibular   PT Next Visit Plan Assess STG 1 and modify/progress HEP as tolerated. Trial amb. with RW   Consulted and Agree with Plan of Care Patient;Family member/caregiver   Family Member Consulted husband: [name]      Patient will benefit from skilled therapeutic intervention in order to improve the following deficits and impairments:  Abnormal gait, Pain, Decreased knowledge of use of DME, Decreased strength, Decreased mobility, Decreased balance, Decreased range of motion, Decreased coordination, Decreased safety awareness, Impaired flexibility, Impaired sensation  Visit Diagnosis: Other  abnormalities of gait and mobility  Unsteadiness on feet  Other lack of coordination  Other disturbances of skin sensation     Problem List Patient Active Problem List   Diagnosis Date Noted  . Gait abnormality 11/14/2016  . Hypotension due to drugs   . Urinary retention   . Anxiety about health   . Reactive depression   . Ataxia   . Abdominal spasms   . Constipation due to pain medication   . Acute lower UTI   . Dysuria   . Acute deep vein thrombosis (DVT) of popliteal vein of left lower extremity ([place])   . Incomplete paraplegia ([place])   . Acute blood loss anemia   . Neurogenic bladder   . Neuropathic pain   . Muscle spasm   . Gastroesophageal reflux disease   . Slow transit constipation   . Thrombocytopenia ([place]) 10/03/2016  . Abnormal MRI, spinal cord   . Encephalomyelitis   . Numbness   . Intractable back pain 09/20/2016  . Numbness of left lower extremity 09/20/2016  . Hyponatremia 09/20/2016  . Herpes zoster without complication 78/29/5621    Deveron Shamoon L 01/01/2017, 4:09 PM  [facility] [address] [place], [name], [address] Phone: [phone]   Fax:  [phone]  Name: WINDY DUDEK MRN: [id number] Date of Birth: [birth year]  Geoffry Paradise, PT,DPT 01/01/17 4:09 PM Phone: [phone] Fax: [phone]

## 2017-01-03 ENCOUNTER — Encounter: Payer: PPO | Admitting: Occupational Therapy

## 2017-01-03 ENCOUNTER — Ambulatory Visit: Payer: PPO | Admitting: Physical Therapy

## 2017-01-07 DIAGNOSIS — I82502 Chronic embolism and thrombosis of unspecified deep veins of left lower extremity: Secondary | ICD-10-CM | POA: Diagnosis not present

## 2017-01-07 DIAGNOSIS — M791 Myalgia: Secondary | ICD-10-CM | POA: Diagnosis not present

## 2017-01-07 DIAGNOSIS — Z79899 Other long term (current) drug therapy: Secondary | ICD-10-CM | POA: Diagnosis not present

## 2017-01-07 DIAGNOSIS — G8222 Paraplegia, incomplete: Secondary | ICD-10-CM | POA: Diagnosis not present

## 2017-01-08 ENCOUNTER — Encounter: Payer: Self-pay | Admitting: Internal Medicine

## 2017-01-08 ENCOUNTER — Ambulatory Visit (INDEPENDENT_AMBULATORY_CARE_PROVIDER_SITE_OTHER): Payer: PPO | Admitting: Internal Medicine

## 2017-01-08 VITALS — BP 117/72 | HR 96 | Temp 97.8°F | Ht 67.0 in | Wt 136.0 lb

## 2017-01-08 DIAGNOSIS — R200 Anesthesia of skin: Secondary | ICD-10-CM

## 2017-01-08 DIAGNOSIS — Z5181 Encounter for therapeutic drug level monitoring: Secondary | ICD-10-CM | POA: Diagnosis not present

## 2017-01-08 DIAGNOSIS — G0490 Encephalitis and encephalomyelitis, unspecified: Secondary | ICD-10-CM

## 2017-01-08 LAB — CBC WITH DIFFERENTIAL/PLATELET
Basophils Absolute: 64 cells/uL (ref 0–200)
Basophils Relative: 1 %
EOS PCT: 5 %
Eosinophils Absolute: 320 cells/uL (ref 15–500)
HCT: 36.9 % (ref 35.0–45.0)
Hemoglobin: 12.3 g/dL (ref 11.7–15.5)
LYMPHS PCT: 36 %
Lymphs Abs: 2304 cells/uL (ref 850–3900)
MCH: 32.8 pg (ref 27.0–33.0)
MCHC: 33.3 g/dL (ref 32.0–36.0)
MCV: 98.4 fL (ref 80.0–100.0)
MONOS PCT: 8 %
MPV: 9.4 fL (ref 7.5–12.5)
Monocytes Absolute: 512 cells/uL (ref 200–950)
NEUTROS PCT: 50 %
Neutro Abs: 3200 cells/uL (ref 1500–7800)
PLATELETS: 277 10*3/uL (ref 140–400)
RBC: 3.75 MIL/uL — AB (ref 3.80–5.10)
RDW: 13.8 % (ref 11.0–15.0)
WBC: 6.4 10*3/uL (ref 3.8–10.8)

## 2017-01-08 LAB — COMPREHENSIVE METABOLIC PANEL
ALT: 35 U/L — AB (ref 6–29)
AST: 55 U/L — AB (ref 10–35)
Albumin: 3.6 g/dL (ref 3.6–5.1)
Alkaline Phosphatase: 201 U/L — ABNORMAL HIGH (ref 33–130)
BILIRUBIN TOTAL: 0.5 mg/dL (ref 0.2–1.2)
BUN: 12 mg/dL (ref 7–25)
CALCIUM: 9 mg/dL (ref 8.6–10.4)
CO2: 26 mmol/L (ref 20–31)
CREATININE: 0.8 mg/dL (ref 0.50–0.99)
Chloride: 103 mmol/L (ref 98–110)
GLUCOSE: 87 mg/dL (ref 65–99)
Potassium: 4.1 mmol/L (ref 3.5–5.3)
SODIUM: 139 mmol/L (ref 135–146)
Total Protein: 5.9 g/dL — ABNORMAL LOW (ref 6.1–8.1)

## 2017-01-08 MED ORDER — VALGANCICLOVIR HCL 450 MG PO TABS: 900.0000 mg | ORAL_TABLET | Freq: Two times a day (BID) | ORAL | 1 refills | Status: DC

## 2017-01-08 NOTE — Progress Notes (Signed)
   Subjective:    Patient ID: Katelyn Lamb, female    DOB: 1946/08/11, 71 y.o.   MRN: [id number]  HPI Here for follow up of myelitis. 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  Some blurred vision and waxing and waning difficulty with ambulation.  She was started back on acyclovir after seeing Dr. Jannifer Franklin and steroids and MRI done with a new hyperintense area noted at T1-3 concerning for a new focus of inflammation.  Since starting the medication, she has not noted any significant changes/improvements.     Review of Systems  Constitutional: Positive for fatigue.  Musculoskeletal: Positive for gait problem.       Stiffness in back/torso  Neurological: Positive for dizziness.       Objective:   Physical Exam  Constitutional: She is oriented to person, place, and time. She appears well-developed and well-nourished.  Eyes: No scleral icterus.  Cardiovascular: Normal rate, regular rhythm and normal heart sounds.   Pulmonary/Chest: Effort normal and breath sounds normal. No respiratory distress. She has no wheezes.  Neurological: She is alert and oriented to person, place, and time.  Contracted and weak hands, legs   SH: no tobacco       Assessment & Plan:

## 2017-01-08 NOTE — Assessment & Plan Note (Addendum)
I am going to try ganciclovir for the potential CMV (and covers HSV) and will check labs including CMV PCR and others related to myelitis to see if other diagnosis comes up now.  However, the MRI did not seem to significant and the original areas are improved so this really makes me think it is improving ovreall, though I can't explain the worsening clinical syndrome.   Labs ordered.   If nothing new shows up, will go back to the twice daily acyclovir

## 2017-01-08 NOTE — Assessment & Plan Note (Signed)
Will check cmp and cbc on valganciclovir in 1 week

## 2017-01-08 NOTE — Assessment & Plan Note (Signed)
New numbness may be related to new area

## 2017-01-09 ENCOUNTER — Ambulatory Visit: Payer: PPO | Admitting: Physical Therapy

## 2017-01-09 ENCOUNTER — Ambulatory Visit: Payer: PPO | Admitting: Occupational Therapy

## 2017-01-09 ENCOUNTER — Telehealth: Payer: Self-pay | Admitting: Infectious Diseases

## 2017-01-09 ENCOUNTER — Encounter: Payer: Self-pay | Admitting: Physical Therapy

## 2017-01-09 ENCOUNTER — Encounter: Payer: Self-pay | Admitting: Occupational Therapy

## 2017-01-09 DIAGNOSIS — M6281 Muscle weakness (generalized): Secondary | ICD-10-CM

## 2017-01-09 DIAGNOSIS — R482 Apraxia: Secondary | ICD-10-CM

## 2017-01-09 DIAGNOSIS — R2681 Unsteadiness on feet: Secondary | ICD-10-CM

## 2017-01-09 DIAGNOSIS — R208 Other disturbances of skin sensation: Secondary | ICD-10-CM

## 2017-01-09 DIAGNOSIS — R2689 Other abnormalities of gait and mobility: Secondary | ICD-10-CM

## 2017-01-09 DIAGNOSIS — R278 Other lack of coordination: Secondary | ICD-10-CM

## 2017-01-09 DIAGNOSIS — R29818 Other symptoms and signs involving the nervous system: Secondary | ICD-10-CM

## 2017-01-09 DIAGNOSIS — R41844 Frontal lobe and executive function deficit: Secondary | ICD-10-CM

## 2017-01-09 LAB — HIV ANTIBODY (ROUTINE TESTING W REFLEX): HIV: NONREACTIVE

## 2017-01-09 LAB — RPR

## 2017-01-09 NOTE — Patient Instructions (Addendum)
01/09/17 Performed marching, hip abd, and hip extension, alternate kicks x10 each with min guard.  Standing Marching   Using a chair if necessary, march in place. Repeat 10 times each leg. Do 1 sessions per day.  http://gt2.exer.us/344   Copyright  VHI. All rights reserved.   Hip Backward Kick   Using a chair for balance, keep legs shoulder width apart and toes pointed for- ward. Slowly extend one leg back, keeping knee straight. Do not lean forward. Repeat with other leg. Repeat 10 times each leg.  Do 1 sessions per day.  http://gt2.exer.us/340   Copyright  VHI. All rights reserved.     Hip Side Kick  Piriformis Stretch, Sitting:  01/09/17 Attempted but unable due to back pain    Sit, one ankle on opposite knee, same-side hand on crossed knee. Push down on knee, keeping spine straight. Lean torso forward, with flat back, until tension is felt in hamstrings and gluteals of crossed-leg side. Hold __30_ seconds.  Repeat with other leg.  Repeat _3__ times per session. Do _1-2__ sessions per day.  Copyright  VHI. All rights reserved.   HIP: Hamstrings - Short Sitting    01/09/17 cues for technique, x2 each 30sec hold    Rest leg on raised surface or on floor. Keep knee straight. Lift chest. Hold __30_ seconds. Repeat on other side.  _3__ reps per set, __1-2_ sets per day, _7__ days per week  Copyright  VHI. All rights reserved.   Perform in a corner with a chair in front of you for safety OR at kitchen sink with chair behind you: Feet Together, Head Motion - Eyes Open  01/09/17 Min A, cues for technique    With eyes open, feet together, move head slowly: up and down 5 times and side to side 5 times. Repeat __3__ times per session. Do __1__ sessions per day.  Copyright  VHI. All rights reserved.  Feet Together, Varied Arm Positions - Eyes Closed  01/09/17 Min A, cues for technique    Stand with feet together and arms at your side. Close eyes and visualize upright  position. Hold __30__ seconds. Repeat __3__ times per session. Do __1__ sessions per day.  Copyright  VHI. All rights reserved.   Functional Quadriceps: Sit to Stand: 01/09/17 Min A with cues for technique    Sit on edge of chair, feet flat on floor. Stand upright, extending knees fully. Repeat __10__ times per set. Do __1__ sets per session. Do __1-2__ sessions per day.

## 2017-01-09 NOTE — Therapy (Signed)
[facility] [address] [place], [name], [address] Phone: [phone]   Fax:  [phone]  Physical Therapy Treatment  Patient Details  Name: Katelyn Lamb MRN: [id number] Date of Birth: 1945-10-15 Referring Provider: Carlos Levering, PA-C  Encounter Date: 01/09/2017      PT End of Session - 01/09/17 1600    Visit Number 6   Number of Visits 17   Date for PT Re-Evaluation 02/04/17   Authorization Type [employer]: G-CODE AND PROGRESS NOTE EVERY 10TH VISIT.    PT Start Time 1455   PT Stop Time 1535   PT Time Calculation (min) 40 min   Equipment Utilized During Treatment Gait belt   Activity Tolerance Patient tolerated treatment well   Behavior During Therapy WFL for tasks assessed/performed      Past Medical History:  Diagnosis Date  . Anxiety   . Back pain   . Gait abnormality 11/14/2016  . Myelitis due to herpes simplex [facility])     Past Surgical History:  Procedure Laterality Date  . ABDOMINAL HYSTERECTOMY    . BLADDER REPAIR    . CESAREAN SECTION    . TUBAL LIGATION      There were no vitals filed for this visit.      Subjective Assessment - 01/09/17 1456    Subjective Pt reports a history of bulging disk that seems to have flared up recently from doing things around the house. Pt's ability to perform HEP is intermittent due to back pain.   Currently in Pain? Yes   Pain Score 10-Worst pain ever   Pain Location Back   Pain Orientation Lower;Mid;Upper   Pain Descriptors / Indicators Shooting   Pain Type Neuropathic pain   Pain Radiating Towards radiates into hands   Pain Onset More than a month ago   Pain Frequency Constant   Aggravating Factors  moving   Pain Relieving Factors lying down in the bed      01/09/17 Performed marching, hip abd, and hip extension, alternate kicks x10 each with min guard.  Standing Marching   Using a chair if necessary, march in place. Repeat 10 times each  leg. Do 1 sessions per day.  http://gt2.exer.us/344   Copyright  VHI. All rights reserved.   Hip Backward Kick   Using a chair for balance, keep legs shoulder width apart and toes pointed for- ward. Slowly extend one leg back, keeping knee straight. Do not lean forward. Repeat with other leg. Repeat 10 times each leg.  Do 1 sessions per day.  http://gt2.exer.us/340   Copyright  VHI. All rights reserved.     Hip Side Kick  Piriformis Stretch, Sitting:  01/09/17 Attempted but unable due to back pain    Sit, one ankle on opposite knee, same-side hand on crossed knee. Push down on knee, keeping spine straight. Lean torso forward, with flat back, until tension is felt in hamstrings and gluteals of crossed-leg side. Hold __30_ seconds.  Repeat with other leg.  Repeat _3__ times per session. Do _1-2__ sessions per day.  Copyright  VHI. All rights reserved.   HIP: Hamstrings - Short Sitting    01/09/17 cues for technique, x2 each 30sec hold    Rest leg on raised surface or on floor. Keep knee straight. Lift chest. Hold __30_ seconds. Repeat on other side.  _3__ reps per set, __1-2_ sets per day, _7__ days per week  Copyright  VHI. All rights reserved.   Perform in a corner  with a chair in front of you for safety OR at kitchen sink with chair behind you: Feet Together, Head Motion - Eyes Open  01/09/17 Min A, cues for technique    With eyes open, feet together, move head slowly: up and down 5 times and side to side 5 times. Repeat __3__ times per session. Do __1__ sessions per day.  Copyright  VHI. All rights reserved.  Feet Together, Varied Arm Positions - Eyes Closed  01/09/17 Min A, cues for technique    Stand with feet together and arms at your side. Close eyes and visualize upright position. Hold __30__ seconds. Repeat __3__ times per session. Do __1__ sessions per day.  Copyright  VHI. All rights reserved.   Functional Quadriceps: Sit to Stand: 01/09/17 Min A with  cues for technique    Sit on edge of chair, feet flat on floor. Stand upright, extending knees fully. Repeat __10__ times per set. Do __1__ sets per session. Do __1-2__ sessions per day.                   [facility] Adult PT Treatment/Exercise - 01/09/17 0001      Ambulation/Gait   Ambulation/Gait Yes   Ambulation/Gait Assistance 4: Min guard   Ambulation/Gait Assistance Details trialled [facility] with tripod tip and soft grip in R hand.   Ambulation Distance (Feet) 115 Feet  x2   Assistive device Straight cane  with tripod tip and soft grip   Gait Pattern Step-through pattern;Decreased dorsiflexion - left;Ataxic;Decreased arm swing - right;Decreased arm swing - left   Ambulation Surface Level;Indoor                PT Education - 01/09/17 1601    Education provided Yes   Education Details Results of testing STG# 1; HEP review, benifits of trialing an AD.   Person(s) Educated Patient;Spouse   Methods Explanation;Demonstration;Verbal cues;Handout   Comprehension Verbalized understanding;Returned demonstration;Verbal cues required;Need further instruction          PT Short Term Goals - 01/09/17 1553      PT SHORT TERM GOAL #1   Title Pt will be IND in HEP to improve strength, flexibility, and balance. TARGET DATE FOR ALL STGS: 01/03/17   Baseline Pt is intermittently performing HEP (to improve strength, flexibility, and balance) at home but reports being limited by back pain. Pt required min guard to min A and cues for teechniqe.   Status Partially Met     PT SHORT TERM GOAL #2   Title Pt will perform TUG in </=13.5sec., IND, to decr. falls risk.   Status Achieved     PT SHORT TERM GOAL #3   Title Pt will improve BERG score to >/=35/56 to decr. falls risk.   Status Achieved     PT SHORT TERM GOAL #4   Title Pt will amb. 300', at MOD I level, over even terrain with LRAD to improve functional mobility.    Status Partially Met           PT Long Term Goals -  12/06/16 1622      PT LONG TERM GOAL #1   Title Pt will improve gait speed to >/=2.59f/sec., no AD, to safely amb. in the community. TARGET DATE FOR ALL LTGS: 01/31/17   Status New     PT LONG TERM GOAL #2   Title Pt will improve BERG score to >/=45/56 to decr. falls risk.    Status New     PT LONG  TERM GOAL #3   Title Pt will amb. 600' over even/uneven terrain, at MOD I level, to progress to walking dog safely.    Status New     PT LONG TERM GOAL #4   Title Pt will verbalize understanding of fall prevention strategies to reduce falls risk.    Status New               Plan - 01/09/17 1555    Clinical Impression Statement Checked STG # 1: Pt is intermittently performing HEP (to improve strength, flexibility, and balance) at home but reports being limited by back pain. Pt required min guard to min A and cues for teechniqe.  Trialled [facility] with tripod tip and  soft grip; pt required min guard but was able to maintain grip and did not drop AD.  Pt/husband report that walker would not be helpful in house environment due to narrow walk ways.                                                                      pl   Rehab Potential Good   Clinical Impairments Affecting Rehab Potential see above   PT Frequency 2x / week   PT Duration 8 weeks   PT Treatment/Interventions ADLs/Self Care Home Management;Electrical Stimulation;Biofeedback;Neuromuscular re-education;Balance training;Therapeutic exercise;Therapeutic activities;Manual techniques;Functional mobility training;Stair training;Gait training;DME Instruction;Orthotic Fit/Training;Patient/family education;Vestibular   PT Next Visit Plan Gait train with SPC with tripod or quad tip.   Consulted and Agree with Plan of Care Patient;Family member/caregiver   Family Member Consulted husband: [name]      Patient will benefit from skilled therapeutic intervention in order to improve the following deficits and impairments:  Abnormal gait, Pain,  Decreased knowledge of use of DME, Decreased strength, Decreased mobility, Decreased balance, Decreased range of motion, Decreased coordination, Decreased safety awareness, Impaired flexibility, Impaired sensation  Visit Diagnosis: Other abnormalities of gait and mobility  Unsteadiness on feet  Other disturbances of skin sensation  Other lack of coordination     Problem List Patient Active Problem List   Diagnosis Date Noted  . Medication monitoring encounter 01/08/2017  . Gait abnormality 11/14/2016  . Hypotension due to drugs   . Urinary retention   . Anxiety about health   . Reactive depression   . Ataxia   . Abdominal spasms   . Constipation due to pain medication   . Acute lower UTI   . Dysuria   . Acute deep vein thrombosis (DVT) of popliteal vein of left lower extremity ([place])   . Incomplete paraplegia ([place])   . Acute blood loss anemia   . Neurogenic bladder   . Neuropathic pain   . Muscle spasm   . Gastroesophageal reflux disease   . Slow transit constipation   . Thrombocytopenia ([place]) 10/03/2016  . Abnormal MRI, spinal cord   . Encephalomyelitis   . Numbness   . Intractable back pain 09/20/2016  . Numbness of left lower extremity 09/20/2016  . Hyponatremia 09/20/2016  . Herpes zoster without complication 50/05/3817    Bjorn Loser, PTA  01/09/17, 4:12 PM [facility] [address] [place], [name], [address] Phone: [phone]   Fax:  [phone]  Name: SIVAN CUELLO MRN: [id number] Date  of Birth: 03-Dec-1945

## 2017-01-09 NOTE — Therapy (Signed)
[facility] [address] [place], [name], [address] Phone: [phone]   Fax:  [phone]  Occupational Therapy Treatment  Patient Details  Name: Katelyn Lamb MRN: [id number] Date of Birth: 02/29/1946 Referring Provider: Carlos Levering PA-C  Encounter Date: 01/09/2017      OT End of Session - 01/09/17 1736    Visit Number 8   Number of Visits 17   Date for OT Re-Evaluation 02/04/17   Authorization Type [employer] - G code required   Authorization - Visit Number 8   Authorization - Number of Visits 10   OT Start Time 6712   OT Stop Time 1618   OT Time Calculation (min) 46 min   Activity Tolerance Patient tolerated treatment well      Past Medical History:  Diagnosis Date  . Anxiety   . Back pain   . Gait abnormality 11/14/2016  . Myelitis due to herpes simplex [facility])     Past Surgical History:  Procedure Laterality Date  . ABDOMINAL HYSTERECTOMY    . BLADDER REPAIR    . CESAREAN SECTION    . TUBAL LIGATION      There were no vitals filed for this visit.      Subjective Assessment - 01/09/17 1541    Subjective  I fell again last Wednesday or Thursday - I was trying to pick up something on the floor that fell between the toilet and the wall.    Patient is accompained by: Family member  husband   Pertinent History HSV2 encephalitis and myelitis, anxiety,    Limitations no driving, fall risk   Patient Stated Goals to be normal and get stronger                      OT Treatments/Exercises (OP) - 01/09/17 0001      Neurological Re-education Exercises   Other Exercises 1 Neuro re ed to address sit to stand, static and dynamic standing balance both with functional ambulation as well as with functional activity. Incorporated functional use of both hands with max cueing to look at hands when using them.  Pt needed min cues for sustained attention and frequently self distracts.                    OT Short Term Goals - 01/09/17 1731      OT SHORT TERM GOAL #1   Title Pt and husband will be mod I with home activities program - 01/05/17   Status Achieved     OT SHORT TERM GOAL #2   Title Pt to feed self 75% of the time AE prn   Status Achieved     OT SHORT TERM GOAL #3   Title Pt will require min a for dressing    Status Achieved  per pt and husband pt only needs assist with buttons/tying     OT SHORT TERM GOAL #4   Title Pt will complete sit to stand with close superivison and vc's for safety as prep for more independent functional mobility   Status Achieved     OT SHORT TERM GOAL #5   Title Improve bilateral UE function as evidenced by performing Box & Blocks with at least an 8 block improvement   Baseline eval: Rt = 17, Lt = 10    Status Achieved  r= 23, L = 12     OT SHORT TERM GOAL #6   Title Pt will  be demonstrate ability for simple activity in standing without UE support with minimal dynamic standing balance with close supervision.     Status Achieved     OT SHORT TERM GOAL #7   Title Pt will demonstrate ability for sustained attention with no more than min vc's during functional familiar basic activity   Status Achieved           OT Long Term Goals - 01/09/17 1732      OT LONG TERM GOAL #1   Title Pt and husband Independent with updated HEP (Due 02/04/17)   Status On-going     OT LONG TERM GOAL #2   Title Pt will demonstrate ability to zip and button with vc's only   Status On-going     OT LONG TERM GOAL #3   Title Pt to perform simple snack/sandwich prep S level   Status On-going     OT LONG TERM GOAL #4   Title Pt to demo sufficient UE strength as demo by folding laundry for 10 minutes w/o rest   Status On-going     OT LONG TERM GOAL #5   Title Pt to improve grip strength by 10 lbs or greater from initial eval   Status On-going     OT LONG TERM GOAL #6   Title Pt will require no more than supervision for dressing     Status On-going     OT LONG TERM GOAL #7   Title Pt will be supervision for toilet transfers   Status On-going     OT LONG TERM GOAL #8   Title Pt will be supervision for shower transfers   Status On-going     OT LONG TERM GOAL  #9   Baseline Pt will demonstrate ability for sustained/selective attention for familar functional task with no more than 2 vc's.    Status On-going               Plan - 01/09/17 1732    Clinical Impression Statement Pt progressing toward goals. Pt gaining in independence in basic self care and self feeding.    Rehab Potential Fair   Clinical Impairments Affecting Rehab Potential apraxia, cognitive deficits, ataxia, severe sensory impairment from chest level down.    OT Frequency 2x / week   OT Duration 8 weeks   OT Treatment/Interventions Self-care/ADL training;Moist Heat;Fluidtherapy;DME and/or AE instruction;Patient/family education;Splinting;Therapeutic activities;Neuromuscular education;Functional Mobility Training;Passive range of motion;Cognitive remediation/compensation;Visual/perceptual remediation/compensation;Manual Therapy;Therapeutic exercise;Balance training   Plan dynamic standing balance incorporating functional use of UE's and sustained attention, safety   Consulted and Agree with Plan of Care Patient;Family member/caregiver   Family Member Consulted husband      Patient will benefit from skilled therapeutic intervention in order to improve the following deficits and impairments:  Abnormal gait, Decreased activity tolerance, Decreased balance, Decreased cognition, Decreased coordination, Decreased safety awareness, Decreased mobility, Decreased strength, Difficulty walking, Impaired UE functional use, Impaired tone, Impaired sensation, Impaired vision/preception, Pain  Visit Diagnosis: Unsteadiness on feet  Other lack of coordination  Other disturbances of skin sensation  Apraxia  Other symptoms and signs involving the nervous  system  Frontal lobe and executive function deficit  Muscle weakness (generalized)    Problem List Patient Active Problem List   Diagnosis Date Noted  . Medication monitoring encounter 01/08/2017  . Gait abnormality 11/14/2016  . Hypotension due to drugs   . Urinary retention   . Anxiety about health   . Reactive depression   . Ataxia   .  Abdominal spasms   . Constipation due to pain medication   . Acute lower UTI   . Dysuria   . Acute deep vein thrombosis (DVT) of popliteal vein of left lower extremity ([place])   . Incomplete paraplegia ([place])   . Acute blood loss anemia   . Neurogenic bladder   . Neuropathic pain   . Muscle spasm   . Gastroesophageal reflux disease   . Slow transit constipation   . Thrombocytopenia ([place]) 10/03/2016  . Abnormal MRI, spinal cord   . Encephalomyelitis   . Numbness   . Intractable back pain 09/20/2016  . Numbness of left lower extremity 09/20/2016  . Hyponatremia 09/20/2016  . Herpes zoster without complication 87/56/4332    Quay Burow, OTR/L 01/09/2017, 5:38 PM  [facility] [address] [place], [name], [address] Phone: [phone]   Fax:  [phone]  Name: BRAYAH URQUILLA MRN: [id number] Date of Birth: 05-Jul-1946

## 2017-01-09 NOTE — Telephone Encounter (Signed)
Husband called that pt was called new antiviral yesterday- it is $2800 for 14 days.  They are unable to afford this.   will stay on acyclovir  I am forwarding to Dr Linus Salmons and Onnie Boer, Rubin Payor [employer] for other suggestions (ie obtain through [facility] or pt assistance)

## 2017-01-10 ENCOUNTER — Ambulatory Visit: Payer: PPO | Admitting: Physical Therapy

## 2017-01-10 ENCOUNTER — Ambulatory Visit: Payer: PPO | Admitting: Occupational Therapy

## 2017-01-10 LAB — CMV (CYTOMEGALOVIRUS) DNA ULTRAQUANT, PCR

## 2017-01-10 LAB — VARICELLA-ZOSTER BY PCR: VZV DNA, QL PCR: NOT DETECTED

## 2017-01-10 LAB — CRYPTOCOCCAL AG, LTX SCR RFLX TITER: CRYPTOCOCCAL AG SCREEN: NOT DETECTED

## 2017-01-10 NOTE — Telephone Encounter (Signed)
Called the husband back today to discuss option about cost of meds. [facility] said that they can do the fill for around [phone] but that is still too much. Instead, I reached out to [facility] directly to see about options. They suggested that we go through patient assistance directly. They will come in on Tues with their financial stuff and sigh one application. I'll submit the application at that time. I think she will be able to get Valcyte.

## 2017-01-10 NOTE — Telephone Encounter (Signed)
Thanks Minh.  It is a shot in the dark to see if it helps her.  Maybe just 14 days worth and see how she does.  If no benefit, I will do further work up and she will go back to acyclovir.

## 2017-01-11 LAB — EPSTEIN BARR VIRUS DNA, QUANT RTPCR: EBV DNA, QN PCR: <200 copies/mL

## 2017-01-12 LAB — FUNGAL ANTIBODIES PANEL, ID-BLOOD
ASPERGILLUS FUMIGATUS: NEGATIVE
Aspergillus Flavus Antibodies: NEGATIVE
Aspergillus Niger Antibodies: NEGATIVE
Blastomyces Abs, Qn, DID: NEGATIVE
Coccidioides Antibody ID: NEGATIVE
HISTOPLASMA ANTIBODY, ID: NEGATIVE

## 2017-01-12 LAB — HTLV I/II DNA, QUAL. REAL TIME PCR
HTLV-I DNA: NOT DETECTED
HTLV-II DNA: NOT DETECTED

## 2017-01-14 ENCOUNTER — Ambulatory Visit: Payer: PPO | Admitting: Occupational Therapy

## 2017-01-14 ENCOUNTER — Ambulatory Visit: Payer: PPO | Admitting: Physical Therapy

## 2017-01-14 DIAGNOSIS — G0401 Postinfectious acute disseminated encephalitis and encephalomyelitis (postinfectious ADEM): Secondary | ICD-10-CM | POA: Diagnosis not present

## 2017-01-14 DIAGNOSIS — K8689 Other specified diseases of pancreas: Secondary | ICD-10-CM | POA: Diagnosis not present

## 2017-01-14 DIAGNOSIS — Z7901 Long term (current) use of anticoagulants: Secondary | ICD-10-CM | POA: Diagnosis not present

## 2017-01-14 DIAGNOSIS — K5900 Constipation, unspecified: Secondary | ICD-10-CM | POA: Diagnosis not present

## 2017-01-14 DIAGNOSIS — Z8661 Personal history of infections of the central nervous system: Secondary | ICD-10-CM | POA: Diagnosis not present

## 2017-01-14 DIAGNOSIS — I825Z9 Chronic embolism and thrombosis of unspecified deep veins of unspecified distal lower extremity: Secondary | ICD-10-CM | POA: Diagnosis not present

## 2017-01-14 DIAGNOSIS — B9620 Unspecified Escherichia coli [E. coli] as the cause of diseases classified elsewhere: Secondary | ICD-10-CM | POA: Diagnosis not present

## 2017-01-14 DIAGNOSIS — I2510 Atherosclerotic heart disease of native coronary artery without angina pectoris: Secondary | ICD-10-CM | POA: Diagnosis not present

## 2017-01-14 DIAGNOSIS — Z87891 Personal history of nicotine dependence: Secondary | ICD-10-CM | POA: Diagnosis not present

## 2017-01-14 DIAGNOSIS — Z90710 Acquired absence of both cervix and uterus: Secondary | ICD-10-CM | POA: Diagnosis not present

## 2017-01-14 DIAGNOSIS — R270 Ataxia, unspecified: Secondary | ICD-10-CM | POA: Diagnosis not present

## 2017-01-14 DIAGNOSIS — G0491 Myelitis, unspecified: Secondary | ICD-10-CM | POA: Diagnosis not present

## 2017-01-14 DIAGNOSIS — N390 Urinary tract infection, site not specified: Secondary | ICD-10-CM | POA: Diagnosis not present

## 2017-01-14 DIAGNOSIS — G0490 Encephalitis and encephalomyelitis, unspecified: Secondary | ICD-10-CM | POA: Diagnosis not present

## 2017-01-15 ENCOUNTER — Telehealth: Payer: Self-pay

## 2017-01-15 ENCOUNTER — Ambulatory Visit: Payer: PPO | Admitting: Neurology

## 2017-01-15 ENCOUNTER — Other Ambulatory Visit: Payer: PPO

## 2017-01-15 DIAGNOSIS — K760 Fatty (change of) liver, not elsewhere classified: Secondary | ICD-10-CM | POA: Diagnosis not present

## 2017-01-15 DIAGNOSIS — R748 Abnormal levels of other serum enzymes: Secondary | ICD-10-CM | POA: Diagnosis not present

## 2017-01-15 NOTE — Telephone Encounter (Signed)
Patient cancelled same day stating that she is at [facility]

## 2017-01-16 DIAGNOSIS — R748 Abnormal levels of other serum enzymes: Secondary | ICD-10-CM | POA: Diagnosis not present

## 2017-01-16 DIAGNOSIS — R933 Abnormal findings on diagnostic imaging of other parts of digestive tract: Secondary | ICD-10-CM | POA: Diagnosis not present

## 2017-01-16 DIAGNOSIS — G0481 Other encephalitis and encephalomyelitis: Secondary | ICD-10-CM | POA: Diagnosis not present

## 2017-01-16 DIAGNOSIS — G0490 Encephalitis and encephalomyelitis, unspecified: Secondary | ICD-10-CM | POA: Diagnosis not present

## 2017-01-17 ENCOUNTER — Ambulatory Visit: Payer: PPO | Admitting: Physical Therapy

## 2017-01-17 ENCOUNTER — Ambulatory Visit: Payer: PPO | Admitting: Occupational Therapy

## 2017-01-17 DIAGNOSIS — R748 Abnormal levels of other serum enzymes: Secondary | ICD-10-CM | POA: Diagnosis not present

## 2017-01-17 DIAGNOSIS — G0490 Encephalitis and encephalomyelitis, unspecified: Secondary | ICD-10-CM | POA: Diagnosis not present

## 2017-01-17 DIAGNOSIS — Z8661 Personal history of infections of the central nervous system: Secondary | ICD-10-CM | POA: Diagnosis not present

## 2017-01-17 DIAGNOSIS — M50222 Other cervical disc displacement at C5-C6 level: Secondary | ICD-10-CM | POA: Diagnosis not present

## 2017-01-17 DIAGNOSIS — M4802 Spinal stenosis, cervical region: Secondary | ICD-10-CM | POA: Diagnosis not present

## 2017-01-18 DIAGNOSIS — Z5189 Encounter for other specified aftercare: Secondary | ICD-10-CM | POA: Diagnosis not present

## 2017-01-18 DIAGNOSIS — G0491 Myelitis, unspecified: Secondary | ICD-10-CM | POA: Diagnosis not present

## 2017-01-19 DIAGNOSIS — G0481 Other encephalitis and encephalomyelitis: Secondary | ICD-10-CM | POA: Diagnosis not present

## 2017-01-19 DIAGNOSIS — G0491 Myelitis, unspecified: Secondary | ICD-10-CM | POA: Diagnosis not present

## 2017-01-19 DIAGNOSIS — R748 Abnormal levels of other serum enzymes: Secondary | ICD-10-CM | POA: Diagnosis not present

## 2017-01-20 DIAGNOSIS — G0481 Other encephalitis and encephalomyelitis: Secondary | ICD-10-CM | POA: Diagnosis not present

## 2017-01-20 DIAGNOSIS — G9389 Other specified disorders of brain: Secondary | ICD-10-CM | POA: Diagnosis not present

## 2017-01-20 DIAGNOSIS — G0491 Myelitis, unspecified: Secondary | ICD-10-CM | POA: Diagnosis not present

## 2017-01-20 DIAGNOSIS — R748 Abnormal levels of other serum enzymes: Secondary | ICD-10-CM | POA: Diagnosis not present

## 2017-01-21 ENCOUNTER — Encounter: Payer: PPO | Admitting: Occupational Therapy

## 2017-01-21 ENCOUNTER — Ambulatory Visit: Payer: PPO | Admitting: Physical Therapy

## 2017-01-21 DIAGNOSIS — R918 Other nonspecific abnormal finding of lung field: Secondary | ICD-10-CM | POA: Diagnosis not present

## 2017-01-21 DIAGNOSIS — G0481 Other encephalitis and encephalomyelitis: Secondary | ICD-10-CM | POA: Diagnosis not present

## 2017-01-21 DIAGNOSIS — I2510 Atherosclerotic heart disease of native coronary artery without angina pectoris: Secondary | ICD-10-CM | POA: Diagnosis not present

## 2017-01-21 DIAGNOSIS — K8689 Other specified diseases of pancreas: Secondary | ICD-10-CM | POA: Diagnosis not present

## 2017-01-21 DIAGNOSIS — M792 Neuralgia and neuritis, unspecified: Secondary | ICD-10-CM | POA: Diagnosis not present

## 2017-01-21 DIAGNOSIS — R935 Abnormal findings on diagnostic imaging of other abdominal regions, including retroperitoneum: Secondary | ICD-10-CM | POA: Diagnosis not present

## 2017-01-21 DIAGNOSIS — G0491 Myelitis, unspecified: Secondary | ICD-10-CM | POA: Diagnosis not present

## 2017-01-21 DIAGNOSIS — G0490 Encephalitis and encephalomyelitis, unspecified: Secondary | ICD-10-CM | POA: Diagnosis not present

## 2017-01-22 DIAGNOSIS — R748 Abnormal levels of other serum enzymes: Secondary | ICD-10-CM | POA: Insufficient documentation

## 2017-01-22 DIAGNOSIS — R935 Abnormal findings on diagnostic imaging of other abdominal regions, including retroperitoneum: Secondary | ICD-10-CM | POA: Diagnosis not present

## 2017-01-22 DIAGNOSIS — G0481 Other encephalitis and encephalomyelitis: Secondary | ICD-10-CM | POA: Diagnosis not present

## 2017-01-22 DIAGNOSIS — I825Z9 Chronic embolism and thrombosis of unspecified deep veins of unspecified distal lower extremity: Secondary | ICD-10-CM | POA: Insufficient documentation

## 2017-01-22 DIAGNOSIS — M792 Neuralgia and neuritis, unspecified: Secondary | ICD-10-CM | POA: Diagnosis not present

## 2017-01-22 DIAGNOSIS — K8689 Other specified diseases of pancreas: Secondary | ICD-10-CM | POA: Diagnosis not present

## 2017-01-23 DIAGNOSIS — G0481 Other encephalitis and encephalomyelitis: Secondary | ICD-10-CM | POA: Diagnosis not present

## 2017-01-23 DIAGNOSIS — R748 Abnormal levels of other serum enzymes: Secondary | ICD-10-CM | POA: Diagnosis not present

## 2017-01-23 DIAGNOSIS — M792 Neuralgia and neuritis, unspecified: Secondary | ICD-10-CM | POA: Diagnosis not present

## 2017-01-24 ENCOUNTER — Encounter: Payer: PPO | Admitting: Occupational Therapy

## 2017-01-24 ENCOUNTER — Ambulatory Visit: Payer: PPO | Admitting: Physical Therapy

## 2017-01-24 DIAGNOSIS — R933 Abnormal findings on diagnostic imaging of other parts of digestive tract: Secondary | ICD-10-CM | POA: Diagnosis not present

## 2017-01-24 DIAGNOSIS — B9620 Unspecified Escherichia coli [E. coli] as the cause of diseases classified elsewhere: Secondary | ICD-10-CM | POA: Diagnosis not present

## 2017-01-24 DIAGNOSIS — K449 Diaphragmatic hernia without obstruction or gangrene: Secondary | ICD-10-CM | POA: Diagnosis not present

## 2017-01-24 DIAGNOSIS — R260 Ataxic gait: Secondary | ICD-10-CM | POA: Diagnosis not present

## 2017-01-24 DIAGNOSIS — K8689 Other specified diseases of pancreas: Secondary | ICD-10-CM | POA: Insufficient documentation

## 2017-01-24 DIAGNOSIS — G0481 Other encephalitis and encephalomyelitis: Secondary | ICD-10-CM | POA: Diagnosis not present

## 2017-01-24 DIAGNOSIS — N390 Urinary tract infection, site not specified: Secondary | ICD-10-CM | POA: Diagnosis not present

## 2017-01-24 DIAGNOSIS — M792 Neuralgia and neuritis, unspecified: Secondary | ICD-10-CM | POA: Diagnosis not present

## 2017-01-25 DIAGNOSIS — M792 Neuralgia and neuritis, unspecified: Secondary | ICD-10-CM | POA: Diagnosis not present

## 2017-01-25 DIAGNOSIS — R260 Ataxic gait: Secondary | ICD-10-CM | POA: Diagnosis not present

## 2017-01-25 DIAGNOSIS — G0481 Other encephalitis and encephalomyelitis: Secondary | ICD-10-CM | POA: Diagnosis not present

## 2017-01-25 DIAGNOSIS — Z5189 Encounter for other specified aftercare: Secondary | ICD-10-CM | POA: Diagnosis not present

## 2017-01-25 DIAGNOSIS — N390 Urinary tract infection, site not specified: Secondary | ICD-10-CM | POA: Diagnosis not present

## 2017-01-25 DIAGNOSIS — B9620 Unspecified Escherichia coli [E. coli] as the cause of diseases classified elsewhere: Secondary | ICD-10-CM | POA: Diagnosis not present

## 2017-01-26 DIAGNOSIS — N390 Urinary tract infection, site not specified: Secondary | ICD-10-CM | POA: Diagnosis not present

## 2017-01-26 DIAGNOSIS — G0481 Other encephalitis and encephalomyelitis: Secondary | ICD-10-CM | POA: Diagnosis not present

## 2017-01-26 DIAGNOSIS — M792 Neuralgia and neuritis, unspecified: Secondary | ICD-10-CM | POA: Diagnosis not present

## 2017-01-26 DIAGNOSIS — R260 Ataxic gait: Secondary | ICD-10-CM | POA: Diagnosis not present

## 2017-01-26 DIAGNOSIS — B9620 Unspecified Escherichia coli [E. coli] as the cause of diseases classified elsewhere: Secondary | ICD-10-CM | POA: Diagnosis not present

## 2017-01-29 ENCOUNTER — Ambulatory Visit: Payer: PPO | Admitting: Occupational Therapy

## 2017-01-29 ENCOUNTER — Ambulatory Visit: Payer: PPO

## 2017-01-31 ENCOUNTER — Ambulatory Visit: Payer: PPO | Admitting: Physical Therapy

## 2017-01-31 ENCOUNTER — Encounter: Payer: PPO | Admitting: Occupational Therapy

## 2017-02-04 ENCOUNTER — Encounter: Payer: Self-pay | Admitting: Physical Therapy

## 2017-02-04 ENCOUNTER — Ambulatory Visit: Payer: PPO | Admitting: Physical Therapy

## 2017-02-04 ENCOUNTER — Ambulatory Visit: Payer: PPO | Attending: Family Medicine | Admitting: Occupational Therapy

## 2017-02-04 DIAGNOSIS — R29818 Other symptoms and signs involving the nervous system: Secondary | ICD-10-CM | POA: Insufficient documentation

## 2017-02-04 DIAGNOSIS — R2681 Unsteadiness on feet: Secondary | ICD-10-CM | POA: Diagnosis not present

## 2017-02-04 DIAGNOSIS — M6281 Muscle weakness (generalized): Secondary | ICD-10-CM | POA: Diagnosis not present

## 2017-02-04 DIAGNOSIS — R482 Apraxia: Secondary | ICD-10-CM

## 2017-02-04 DIAGNOSIS — R2689 Other abnormalities of gait and mobility: Secondary | ICD-10-CM | POA: Diagnosis not present

## 2017-02-04 DIAGNOSIS — R208 Other disturbances of skin sensation: Secondary | ICD-10-CM | POA: Diagnosis not present

## 2017-02-04 DIAGNOSIS — R278 Other lack of coordination: Secondary | ICD-10-CM | POA: Diagnosis not present

## 2017-02-04 DIAGNOSIS — R41844 Frontal lobe and executive function deficit: Secondary | ICD-10-CM

## 2017-02-04 NOTE — Therapy (Signed)
[facility] [address] [place], [name], [address] Phone: [phone]   Fax:  [phone]  Occupational Therapy Treatment  Patient Details  Name: Katelyn Lamb MRN: [id number] Date of Birth: December 09, 1945 Referring Provider: Carlos Levering PA-C  Encounter Date: 02/04/2017    Past Medical History:  Diagnosis Date  . Anxiety   . Back pain   . Gait abnormality 11/14/2016  . Myelitis due to herpes simplex [facility])     Past Surgical History:  Procedure Laterality Date  . ABDOMINAL HYSTERECTOMY    . BLADDER REPAIR    . CESAREAN SECTION    . TUBAL LIGATION      There were no vitals filed for this visit.   This is pt's first OT session since 01/09/2017.  Pt has been in the hospital and working with both physicians from [place] as well as [place].  Husband reports that doctors are unsure of what is going on medically with pt - that they have forwarded information to the [facility] clinic.  Pt reports they increased her gabapentin to 900 mg/day and "this has made me totally unable to do anything."  Pt attended PT today but then stated she felt unable to do OT and was leaving.  Long discussion with pt and husband given pt's fluctuating medical status and lack of ability to attend therapies with any consistency.  Pt was offered to continue with POC as is (not recommended given lack of ability to tolerate), continue with one therapy or go on hold for both therapies until more medically stable.  After much discussion, pt stated she felt like "I should do something."  Pt feels her balance deficit is her biggest obstacle at this time therefore pt will continue with PT and put OT on hold (per pt request).  Will place pt on hold for OT until either pt is more medically stable and/or pt feels she can tolerate two therapies.  Pt and husband in agreement.                            OT Short Term Goals - 02/04/17 1605      OT SHORT  TERM GOAL #1   Title Pt and husband will be mod I with home activities program - 01/05/17   Status Achieved     OT SHORT TERM GOAL #2   Title Pt to feed self 75% of the time AE prn   Status Achieved     OT SHORT TERM GOAL #3   Title Pt will require min a for dressing    Status Achieved  per pt and husband pt only needs assist with buttons/tying     OT SHORT TERM GOAL #4   Title Pt will complete sit to stand with close superivison and vc's for safety as prep for more independent functional mobility   Status Achieved     OT SHORT TERM GOAL #5   Title Improve bilateral UE function as evidenced by performing Box & Blocks with at least an 8 block improvement   Baseline eval: Rt = 17, Lt = 10    Status Achieved  r= 23, L = 12     OT SHORT TERM GOAL #6   Title Pt will be demonstrate ability for simple activity in standing without UE support with minimal dynamic standing balance with close supervision.     Status Achieved     OT SHORT TERM GOAL #7  Title Pt will demonstrate ability for sustained attention with no more than min vc's during functional familiar basic activity   Status Achieved           OT Long Term Goals - 02/04/17 1605      OT LONG TERM GOAL #1   Title Pt and husband Independent with updated HEP (Due 02/04/17)   Status On-going     OT LONG TERM GOAL #2   Title Pt will demonstrate ability to zip and button with vc's only   Status On-going     OT LONG TERM GOAL #3   Title Pt to perform simple snack/sandwich prep S level   Status On-going     OT LONG TERM GOAL #4   Title Pt to demo sufficient UE strength as demo by folding laundry for 10 minutes w/o rest   Status On-going     OT LONG TERM GOAL #5   Title Pt to improve grip strength by 10 lbs or greater from initial eval   Status On-going     OT LONG TERM GOAL #6   Title Pt will require no more than supervision for dressing    Status On-going     OT LONG TERM GOAL #7   Title Pt will be supervision for  toilet transfers   Status On-going     OT LONG TERM GOAL #8   Title Pt will be supervision for shower transfers   Status On-going     OT LONG TERM GOAL  #9   Baseline Pt will demonstrate ability for sustained/selective attention for familar functional task with no more than 2 vc's.    Status On-going             Patient will benefit from skilled therapeutic intervention in order to improve the following deficits and impairments:     Visit Diagnosis: Unsteadiness on feet  Other disturbances of skin sensation  Other lack of coordination  Apraxia  Frontal lobe and executive function deficit  Muscle weakness (generalized)  Other symptoms and signs involving the nervous system    Problem List Patient Active Problem List   Diagnosis Date Noted  . Medication monitoring encounter 01/08/2017  . Gait abnormality 11/14/2016  . Hypotension due to drugs   . Urinary retention   . Anxiety about health   . Reactive depression   . Ataxia   . Abdominal spasms   . Constipation due to pain medication   . Acute lower UTI   . Dysuria   . Acute deep vein thrombosis (DVT) of popliteal vein of left lower extremity ([place])   . Incomplete paraplegia ([place])   . Acute blood loss anemia   . Neurogenic bladder   . Neuropathic pain   . Muscle spasm   . Gastroesophageal reflux disease   . Slow transit constipation   . Thrombocytopenia ([place]) 10/03/2016  . Abnormal MRI, spinal cord   . Encephalomyelitis   . Numbness   . Intractable back pain 09/20/2016  . Numbness of left lower extremity 09/20/2016  . Hyponatremia 09/20/2016  . Herpes zoster without complication 63/09/6008    Quay Burow, OTR/L 02/04/2017, 4:05 PM  [facility] [address] [place], [name], [address] Phone: [phone]   Fax:  [phone]  Name: Katelyn Lamb MRN: [id number] Date of Birth: 1946/02/18

## 2017-02-04 NOTE — Therapy (Signed)
[facility] [address] [place], [name], [address] Phone: [phone]   Fax:  [phone]  Physical Therapy Treatment  Patient Details  Name: Katelyn Lamb MRN: [id number] Date of Birth: [birth year] Referring Provider: Carlos Levering, PA-C  Encounter Date: 02/04/2017      PT End of Session - 02/04/17 1624    Visit Number 7   Number of Visits 23  7 prior + 16 re-cert on 6/4   Date for PT Re-Evaluation 03/29/17   Authorization Type [employer]: G-CODE AND PROGRESS NOTE EVERY 10TH VISIT.    Authorization Time Period 02/04/17 to 04/05/17   PT Start Time 1445   PT Stop Time 1533   PT Time Calculation (min) 48 min   Equipment Utilized During Treatment Gait belt   Activity Tolerance Patient limited by fatigue   Behavior During Therapy WFL for tasks assessed/performed      Past Medical History:  Diagnosis Date  . Anxiety   . Back pain   . Gait abnormality 11/14/2016  . Myelitis due to herpes simplex [facility])     Past Surgical History:  Procedure Laterality Date  . ABDOMINAL HYSTERECTOMY    . BLADDER REPAIR    . CESAREAN SECTION    . TUBAL LIGATION      There were no vitals filed for this visit.      Subjective Assessment - 02/04/17 1454    Subjective REports she felt better after [facility] admission and underwent plasmapheresis (5 doses) and solumedrol. However she has been feeling worse since they tripled her gabapentin (per pt 356m 3x/day to 9057m3x/day). She has become more unsteady "can't walk straight."   Patient is accompained by: Family member  Katelyn Lamb   Pertinent History LLE DVT, anxiety, B cataract surgery in 2017   Patient Stated Goals Pt would like to be IND in everything (walk, feed dog, and perform ADLs safely).    Currently in Pain? Yes   Pain Score 10-Worst pain ever   Pain Location Hand   Pain Orientation Right;Left   Pain Descriptors / Indicators Other (Comment)  nerve pain; feels  sensitive, almost raw   Pain Type Neuropathic pain   Pain Onset More than a month ago   Pain Frequency Constant                         [facility] Adult PT Treatment/Exercise - 02/04/17 0001      Transfers   Transfers Sit to Stand;Stand to Sit   Sit to Stand 4: Min guard   Sit to Stand Details (indicate cue type and reason) does not use hands due to pain, multiple attempts to come to stand, uses back of legs against mat table for leverage   Stand to Sit 4: Min guard;4: Min assist;Without upper extremity assist;Uncontrolled descent     Ambulation/Gait   Ambulation/Gait Assistance 4: Min assist   Ambulation/Gait Assistance Details up to min assist by PT to maintain balance (especially when turning); no device due to hand pain/weakness   Ambulation Distance (Feet) 120 Feet  120, 80   Assistive device None   Gait Pattern Step-through pattern;Decreased arm swing - right;Decreased arm swing - left;Decreased step length - right;Decreased step length - left;Lateral hip instability;Decreased trunk rotation;Narrow base of support;Wide base of support   Ambulation Surface Level;Indoor   Gait velocity 2.21 ft/sec  32.8 ft/14.81 sec     Posture/Postural Control   Posture/Postural Control Postural limitations  Posture Comments postural instability in standing required up to mod assist to prevent falling (delayed reactions); sitting balance (static) WFL with slight forward head, holds hands guarded in her lap     Standardized Balance Assessment   Standardized Balance Assessment Berg Balance Test     Berg Balance Test   Sit to Stand Needs minimal aid to stand or to stabilize   Standing Unsupported Unable to stand 30 seconds unassisted  14 sec, 28 sec   Sitting with Back Unsupported but Feet Supported on Floor or Stool Able to sit safely and securely 2 minutes   Stand to Sit Sits independently, has uncontrolled descent   Transfers Needs one person to assist   Standing Unsupported  with Eyes Closed Needs help to keep from falling   Standing Ubsupported with Feet Together Needs help to attain position and unable to hold for 15 seconds   From Standing, Reach Forward with Outstretched Arm Reaches forward but needs supervision  able to reach 10 inch, but must have close-guarding    From Standing Position, Pick up Object from [place] to pick up shoe, needs supervision   From Standing Position, Turn to Look Behind Over each Shoulder Needs supervision when turning   Turn 360 Degrees Needs close supervision or verbal cueing   Standing Unsupported, Alternately Place Feet on Step/Stool Needs assistance to keep from falling or unable to try   Standing Unsupported, One Foot in Front Needs help to step but can hold 15 seconds   Standing on One Leg Unable to try or needs assist to prevent fall  unable to try   Total Score 14     Knee/Hip Exercises: Standing   Hip Flexion Stengthening;Both;1 set;10 reps;Knee bent   Hip Flexion Limitations vc for wider base of support as alternating; UE support   Hip Abduction Stengthening;Both;1 set;10 reps;Knee straight   Abduction Limitations vc for alignment   Hip Extension Stengthening;Both;1 set;10 reps                PT Education - 02/04/17 1623    Education provided Yes   Education Details results of Berg and gait velocity; POC; must have 24/7 assistance (including standing exercises at counter)   Person(s) Educated Patient;Spouse   Methods Explanation;Demonstration   Comprehension Verbalized understanding          PT Short Term Goals - 02/04/17 1704      PT SHORT TERM GOAL #1   Title ---See 01/09/17 note for prior STGs and results     PT SHORT TERM GOAL #2   Title ---     PT SHORT TERM GOAL #3   Title ---     PT SHORT TERM GOAL #4   Title ---     PT SHORT TERM GOAL #5   Title Patient/caregiver will be independent with updated HEP to improve strength and balance. (TARGET date 03/01/17)   Time 4   Period Weeks    Status New     Additional Short Term Goals   Additional Short Term Goals Yes     PT SHORT TERM GOAL #6   Title Patient will improve Berg balance to >=21/56 to demonstrate improved balance and lesser risk of falls. (TARGET date 03/01/17)   Time 4   Period Weeks   Status New     PT SHORT TERM GOAL #7   Title Patient will ambulate 300 ft with least restrictive assistive device vs no device with supervision. (TARGET date 03/01/17)   Time  4   Period Weeks   Status New           PT Long Term Goals - 02/04/17 2024      PT LONG TERM GOAL #1   Title Pt will improve gait speed to >/=2.55f/sec., no AD, to safely amb. in the community. TARGET DATE FOR ALL LTGS: 01/31/17   Baseline 6/4  2.21 ft/sec   Status Partially Met     PT LONG TERM GOAL #2   Title Pt will improve BERG score to >/=45/56 to decr. falls risk.    Baseline 6/4  14/56 (after hospitalized for medical decline)   Status Not Met     PT LONG TERM GOAL #3   Title Pt will amb. 600' over even/uneven terrain, at MOD I level, to progress to walking dog safely.    Baseline 6/4 requires min assist over level, indoor    Status Not Met     PT LONG TERM GOAL #4   Title Pt will verbalize understanding of fall prevention strategies to reduce falls risk.    Baseline 6/4 Husband can report education provided. He found no areas needing to be addressed at home. Pt unable to report on education.   Status Partially Met     PT LONG TERM GOAL #5   Title Patient/caregiver will be independent in updated HEP. (TARGET 03/29/17)   Time 8   Period Weeks   Status New     Additional Long Term Goals   Additional Long Term Goals Yes     PT LONG TERM GOAL #6   Title Patient will improve gait velocity to >2.62 ft/sec to indicate incr safety with community ambulation. (TARGET 03/29/17)   Time 8   Period Weeks   Status New     PT LONG TERM GOAL #7   Title Patient will improve Berg score to >= 28/56 to indicate improved balance and lesser risk  of falls. (TARGET 03/29/17)   Time 8   Period Weeks   Status New     PT LONG TERM GOAL #8   Title Patient will ambulate 600 ft over level and uneven terrain with least restrictive device (vs no device) with supervision for safety. (TARGET 03/29/17)   Time 8   Period Weeks   Status New               Plan - 02/04/17 1630    Clinical Impression Statement [facility]see return to PT referral in media tab). Patient admitted due to decline in sensation and underwent plasmapheresis x 5 doses and solumedrol. Diagnosis remains uncertain with test result for ?paraneoplasm still pending. Patient has declined in her ability to ambulate due to balance deficits (which have worsened--Berg on 01/01/17  38/56, today 14/56). Patient desperately wants to continue PT at this time as she feels the gabapentin is the reason she is worse and is slowly tapering off the gabapentin. With the decline in status, will extend her PT for 8 weeks (pt missed the last 4 weeks due to decline/hospitalization) and reassess goals.    Rehab Potential Good   Clinical Impairments Affecting Rehab Potential see above   PT Frequency 2x / week   PT Duration 8 weeks   PT Treatment/Interventions ADLs/Self Care Home Management;Electrical Stimulation;Biofeedback;Neuromuscular re-education;Balance training;Therapeutic exercise;Therapeutic activities;Manual techniques;Functional mobility training;Stair training;Gait training;DME Instruction;Orthotic Fit/Training;Patient/family education;Vestibular   PT Next Visit Plan Continue to assess if HEP remains appropriate or needs to be updated; balance and  gait training. Scored 14 on Berg 6/4   Consulted and Agree with Plan of Care Patient;Family member/caregiver   Family Member Consulted husband: Katelyn Lamb      Patient will benefit from skilled therapeutic intervention in order to improve the following deficits and impairments:  Abnormal  gait, Pain, Decreased knowledge of use of DME, Decreased strength, Decreased mobility, Decreased balance, Decreased range of motion, Decreased coordination, Decreased safety awareness, Impaired flexibility, Impaired sensation  Visit Diagnosis: Unsteadiness on feet - Plan: PT plan of care cert/re-cert  Other disturbances of skin sensation - Plan: PT plan of care cert/re-cert  Muscle weakness (generalized) - Plan: PT plan of care cert/re-cert  Other symptoms and signs involving the nervous system - Plan: PT plan of care cert/re-cert  Other abnormalities of gait and mobility - Plan: PT plan of care cert/re-cert     Problem List Patient Active Problem List   Diagnosis Date Noted  . Medication monitoring encounter 01/08/2017  . Gait abnormality 11/14/2016  . Hypotension due to drugs   . Urinary retention   . Anxiety about health   . Reactive depression   . Ataxia   . Abdominal spasms   . Constipation due to pain medication   . Acute lower UTI   . Dysuria   . Acute deep vein thrombosis (DVT) of popliteal vein of left lower extremity ([place])   . Incomplete paraplegia ([place])   . Acute blood loss anemia   . Neurogenic bladder   . Neuropathic pain   . Muscle spasm   . Gastroesophageal reflux disease   . Slow transit constipation   . Thrombocytopenia ([place]) 10/03/2016  . Abnormal MRI, spinal cord   . Encephalomyelitis   . Numbness   . Intractable back pain 09/20/2016  . Numbness of left lower extremity 09/20/2016  . Hyponatremia 09/20/2016  . Herpes zoster without complication 78/47/8412   Physical Therapy Progress Note  Dates of Reporting Period: 12/06/16 to 02/04/17   Objective Reports of Subjective Statement: Patient reports her balance is worse after hospitalization  Objective Measurements: Merrilee Jansky Balance 14/56  Goal Update: see above  Plan: see above  Reason Skilled Services are Required: Patient is a high fall risk and potentially can reduce falls with PT/improved  balance and gait   Rexanne Mano, PT 02/04/2017, 8:41 PM  [facility] [address] [place], [name], [address] Phone: [phone]   Fax:  [phone]  Name: Katelyn Lamb MRN: [id number] Date of Birth: April 27, 1946

## 2017-02-06 ENCOUNTER — Ambulatory Visit: Payer: PPO | Admitting: Internal Medicine

## 2017-02-07 ENCOUNTER — Telehealth: Payer: Self-pay | Admitting: *Deleted

## 2017-02-07 ENCOUNTER — Ambulatory Visit: Payer: PPO | Admitting: Physical Therapy

## 2017-02-07 ENCOUNTER — Encounter: Payer: PPO | Admitting: Occupational Therapy

## 2017-02-07 DIAGNOSIS — R208 Other disturbances of skin sensation: Secondary | ICD-10-CM

## 2017-02-07 DIAGNOSIS — R2681 Unsteadiness on feet: Secondary | ICD-10-CM

## 2017-02-07 DIAGNOSIS — R2689 Other abnormalities of gait and mobility: Secondary | ICD-10-CM

## 2017-02-07 NOTE — Telephone Encounter (Signed)
Called and spoke with husband. Scheduled appt for tomorrow at 12pm, check in 1130am. He stated she was amitted to [facility] for special treatments. Now wants to f/u with CW,MD. They missed last appt d/t her being in hospital.

## 2017-02-08 ENCOUNTER — Encounter: Payer: Self-pay | Admitting: Neurology

## 2017-02-08 ENCOUNTER — Ambulatory Visit (INDEPENDENT_AMBULATORY_CARE_PROVIDER_SITE_OTHER): Payer: PPO | Admitting: Neurology

## 2017-02-08 VITALS — BP 104/68 | HR 96 | Ht 67.0 in | Wt 137.5 lb

## 2017-02-08 DIAGNOSIS — M792 Neuralgia and neuritis, unspecified: Secondary | ICD-10-CM | POA: Diagnosis not present

## 2017-02-08 DIAGNOSIS — G0490 Encephalitis and encephalomyelitis, unspecified: Secondary | ICD-10-CM

## 2017-02-08 DIAGNOSIS — R269 Unspecified abnormalities of gait and mobility: Secondary | ICD-10-CM

## 2017-02-08 MED ORDER — DULOXETINE HCL 60 MG PO CPEP: 60.0000 mg | ORAL_CAPSULE | Freq: Two times a day (BID) | ORAL | 3 refills | Status: DC

## 2017-02-08 MED ORDER — PREGABALIN 50 MG PO CAPS: 50.0000 mg | ORAL_CAPSULE | Freq: Two times a day (BID) | ORAL | 3 refills | Status: DC

## 2017-02-08 NOTE — Patient Instructions (Signed)
    We will go up on the cymbalta 30 mg to one in the morning and 2 in the evening for 2 weeks, then take 60 mg tablets twice a day.  Stop the gabapentin and start Lyrica 50 mg twice a day.

## 2017-02-08 NOTE — Progress Notes (Signed)
Reason for visit: Encephalomyelitis  Katelyn Lamb is an 71 y.o. 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 The patient did have positive HSV-2 PCR in the spinal fluid documented. The patient has been on immunosuppressant medication taking acyclovir. The patient has been seen by Dr. Linus Salmons from infectious disease. The plans were switch the patient to ganciclovir, but the medication was too expensive for the patient. She has been admitted to [facility], an extensive workup was undertaken at that time. The patient has had a repeat herpes PCR panel that was negative, but the patient was on acyclovir at the time of the studies. The patient had paraneoplastic antibodies and antibodies for encephalomyelitis that were all negative. A repeat lumbar puncture showed normalization of inflammatory cells, glucose, and protein. The patient was subjected to plasmapheresis, this did not offer any benefit with her clinical symptoms. The gabapentin dosing was increased to 900 mg 3 times daily to help control the pain but this resulted in significant gait instability, the patient has reduced the dose to 300 mg twice daily. The patient remains in physical and occupational therapy. The patient had a lot of fatigue issues. She has not had any falls, but her balance is poor. The patient has poor sensation in the hands and has difficulty using the hands for this reason. She generally is able to sleep fairly well at night, but her feet do cause discomfort, and she has a lot of discomfort in the groin area. The patient returns to this office for an evaluation.  Past Medical History:  Diagnosis Date  . Anxiety   . Back pain   . Gait abnormality 11/14/2016  . Myelitis due to herpes simplex [facility])     Past Surgical History:  Procedure Laterality Date  . ABDOMINAL HYSTERECTOMY    . BLADDER  REPAIR    . CESAREAN SECTION    . TUBAL LIGATION      Family History  Problem Relation Age of Onset  . Hypertension Mother     Social history:  reports that she has quit smoking. She has never used smokeless tobacco. She reports that she does not drink alcohol or use drugs.    Allergies  Allergen Reactions  . Demerol [Meperidine] Other (See Comments)    Hallucinations  . Amoxicillin-Pot Clavulanate Other (See Comments)  . Penicillins Other (See Comments)    Medications:  Prior to Admission medications   Medication Sig Start Date End Date Taking? Authorizing Provider  acyclovir (ZOVIRAX) 400 MG tablet Take 1 tablet (400 mg total) by mouth 2 (two) times daily. 12/26/16  Yes [name], MD  ALPRAZolam Duanne Moron) 1 MG tablet Take 1 mg by mouth as needed. 10/26/16  Yes [provider]  calcium carbonate (OS-CAL) 600 MG TABS tablet Take 600 mg by mouth 2 (two) times daily with a meal.   Yes [provider]  cholecalciferol (VITAMIN D) 1000 units tablet Take 1,000 Units by mouth daily.   Yes [provider]  diazepam (VALIUM) 5 MG tablet Take 5 mg by mouth 2 (two) times daily. 10/26/16  Yes [provider]  HYDROcodone-acetaminophen (NORCO) 10-325 MG tablet Take 1-2 tablets by mouth every 4 (four) hours as needed for moderate pain. 10/26/16  Yes Angiulli, Lavon Paganini, PA-C  omega-3 acid ethyl esters (LOVAZA) 1 g capsule Take 1 capsule (1 g total)  by mouth 2 (two) times daily. 10/26/16  Yes Angiulli, Lavon Paganini, PA-C  pantoprazole (PROTONIX) 40 MG tablet Take 1 tablet (40 mg total) by mouth daily. 10/26/16  Yes Angiulli, Lavon Paganini, PA-C  polyethylene glycol (MIRALAX / GLYCOLAX) packet Take 17 g by mouth daily. Patient taking differently: Take 17 g by mouth daily as needed.  10/26/16  Yes Angiulli, Lavon Paganini, PA-C  rivaroxaban (XARELTO) 20 MG TABS tablet 20 mg tablet daily to begin 10/31/2016 10/26/16  Yes Angiulli, Lavon Paganini, PA-C  DULoxetine (CYMBALTA) 60 MG  capsule Take 1 capsule (60 mg total) by mouth 2 (two) times daily. 02/08/17   [name], MD  pregabalin (LYRICA) 50 MG capsule Take 1 capsule (50 mg total) by mouth 2 (two) times daily. 02/08/17   [name], MD  valGANciclovir (VALCYTE) 450 MG tablet Take 2 tablets (900 mg total) by mouth 2 (two) times daily. Patient not taking: Reported on 02/08/2017 01/08/17   Thayer Headings, MD    ROS:  Out of a complete 14 system review of symptoms, the patient complains only of the following symptoms, and all other reviewed systems are negative.  Gait disturbance Numbness  Blood pressure 104/68, pulse 96, height 5\' 7"  (1.702 m), weight 137 lb 8 oz (62.4 kg).  Physical Exam  General: The patient is alert and cooperative at the time of the examination.  Skin: No significant peripheral edema is noted.   Neurologic Exam  Mental status: The patient is alert and oriented x 3 at the time of the examination. The patient has apparent normal recent and remote memory, with an apparently normal attention span and concentration ability.   Cranial nerves: Facial symmetry is present. Speech is normal, no aphasia or dysarthria is noted. Extraocular movements are full. Visual fields are full.  Motor: The patient has good strength in all 4 extremities, with exception of some slight weakness in the triceps muscles bilaterally.  Sensory examination: Soft touch sensation is symmetric on the face, arms, and legs.  Coordination: The patient has mild ataxia with finger-nose-finger and heel-to-shin bilaterally..  Gait and station: The patient has a slightly wide-based, unsteady gait. Tandem gait was not attempted. Romberg is positive. No drift is seen.  Reflexes: Deep tendon reflexes are symmetric.   Assessment/Plan:  1. Encephalomyelitis  2. Sensory ataxia  3. Chronic pain syndrome  The patient has what is thought to be a HSV-2 encephalomyelitis. The patient has developed a chronic pain syndrome  with a sensory ataxia. The patient has difficulty using her hands and walking because she cannot feel her extremities well enough. She will be taken off of gabapentin in that she believes that this has offered no benefit with pain control. She will be placed on Lyrica taking 50 mg twice daily. She will be increased on Cymbalta to 90 mg daily for 2 weeks and then go to 60 mg twice daily. She will follow-up in 3 months, she will remain on acyclovir.  Jill Alexanders MD 02/08/2017 1:19 PM  [facility] [address] [place], [place] [address]  Phone [phone] Fax [phone]

## 2017-02-08 NOTE — Therapy (Signed)
[facility] [address] [place], [name], [address] Phone: [phone]   Fax:  [phone]  Physical Therapy Treatment  Patient Details  Name: Katelyn Lamb MRN: [id number] Date of Birth: February 20, 1946 Referring Provider: Carlos Levering, PA-C  Encounter Date: 02/07/2017      PT End of Session - 02/08/17 1341    Visit Number 8   Number of Visits 23   Date for PT Re-Evaluation 03/29/17   Authorization Type [employer]: G-CODE AND PROGRESS NOTE EVERY 10TH VISIT.    Authorization Time Period 02/04/17 to 04/05/17   PT Start Time 1452   PT Stop Time 1538   PT Time Calculation (min) 46 min      Past Medical History:  Diagnosis Date  . Anxiety   . Back pain   . Gait abnormality 11/14/2016  . Myelitis due to herpes simplex [facility])     Past Surgical History:  Procedure Laterality Date  . ABDOMINAL HYSTERECTOMY    . BLADDER REPAIR    . CESAREAN SECTION    . TUBAL LIGATION      There were no vitals filed for this visit.      Subjective Assessment - 02/08/17 1334    Subjective Pt states she is feeling worse today than she was at visit on Monday this week; reports she is having some neck stiffness and wonders if "this stuff going to go all the way up to my head?"    Patient is accompained by: Family member   Pertinent History LLE DVT, anxiety, B cataract surgery in 2017   Patient Stated Goals Pt would like to be IND in everything (walk, feed dog, and perform ADLs safely).    Currently in Pain? Yes   Pain Score 6    Pain Location Neck   Pain Orientation Right;Left   Pain Descriptors / Indicators Tightness   Pain Type Neuropathic pain                         [facility] Adult PT Treatment/Exercise - 02/08/17 0001      Transfers   Transfers Sit to Stand;Stand to Sit   Sit to Stand 4: Min guard   Sit to Stand Details (indicate cue type and reason) initially used back of legs against mat table   Stand  to Sit 4: Min guard;4: Min assist;Without upper extremity assist;Uncontrolled descent   Number of Reps Other reps (comment)  5 reps with cues for weight shifting     Ambulation/Gait   Ambulation/Gait Yes   Ambulation/Gait Assistance 4: Min assist   Ambulation/Gait Assistance Details no device used due to lack of sensation in hands   Ambulation Distance (Feet) 230 Feet   Assistive device None   Gait Pattern Step-through pattern;Decreased arm swing - right;Decreased arm swing - left;Decreased step length - right;Decreased step length - left;Lateral hip instability;Decreased trunk rotation;Narrow base of support;Wide base of support   Ambulation Surface Level;Indoor      NeuroRe-ed:  Pt performed tall kneeling for trunk stabilization; weight shifting in this position by moving each LE out/in  and up/back 5 reps each for balance training with min to mod assist for balance recovery  Partial squats back on heels with return to upright x 10 reps for hip extensor strengthening       Balance Exercises - 02/08/17 1340      Balance Exercises: Standing   Rockerboard Anterior/posterior;EO;30 seconds  inside // bars  Sidestepping 2 reps  inside // bars   Other Standing Exercises Marching in place 10 reps each LE inside // bars    Pt performed alternate stepping forward/back and laterally with min assist for balance - 5 reps each LE each direction       PT Education - 02/08/17 1341    Education provided Yes   Education Details recommended pt to resume doing standing balance exercises with husband's assistance   Person(s) Educated Patient;Spouse   Methods Explanation;Demonstration   Comprehension Verbalized understanding          PT Short Term Goals - 02/04/17 1704      PT SHORT TERM GOAL #1   Title ---See 01/09/17 note for prior STGs and results     PT SHORT TERM GOAL #2   Title ---     PT SHORT TERM GOAL #3   Title ---     PT SHORT TERM GOAL #4   Title ---     PT SHORT  TERM GOAL #5   Title Patient/caregiver will be independent with updated HEP to improve strength and balance. (TARGET date 03/01/17)   Time 4   Period Weeks   Status New     Additional Short Term Goals   Additional Short Term Goals Yes     PT SHORT TERM GOAL #6   Title Patient will improve Berg balance to >=21/56 to demonstrate improved balance and lesser risk of falls. (TARGET date 03/01/17)   Time 4   Period Weeks   Status New     PT SHORT TERM GOAL #7   Title Patient will ambulate 300 ft with least restrictive assistive device vs no device with supervision. (TARGET date 03/01/17)   Time 4   Period Weeks   Status New           PT Long Term Goals - 02/04/17 2024      PT LONG TERM GOAL #1   Title Pt will improve gait speed to >/=2.53f/sec., no AD, to safely amb. in the community. TARGET DATE FOR ALL LTGS: 01/31/17   Baseline 6/4  2.21 ft/sec   Status Partially Met     PT LONG TERM GOAL #2   Title Pt will improve BERG score to >/=45/56 to decr. falls risk.    Baseline 6/4  14/56 (after hospitalized for medical decline)   Status Not Met     PT LONG TERM GOAL #3   Title Pt will amb. 600' over even/uneven terrain, at MOD I level, to progress to walking dog safely.    Baseline 6/4 requires min assist over level, indoor    Status Not Met     PT LONG TERM GOAL #4   Title Pt will verbalize understanding of fall prevention strategies to reduce falls risk.    Baseline 6/4 Husband can report education provided. He found no areas needing to be addressed at home. Pt unable to report on education.   Status Partially Met     PT LONG TERM GOAL #5   Title Patient/caregiver will be independent in updated HEP. (TARGET 03/29/17)   Time 8   Period Weeks   Status New     Additional Long Term Goals   Additional Long Term Goals Yes     PT LONG TERM GOAL #6   Title Patient will improve gait velocity to >2.62 ft/sec to indicate incr safety with community ambulation. (TARGET 03/29/17)    Time 8   Period Weeks   Status New  PT LONG TERM GOAL #7   Title Patient will improve Berg score to >= 28/56 to indicate improved balance and lesser risk of falls. (TARGET 03/29/17)   Time 8   Period Weeks   Status New     PT LONG TERM GOAL #8   Title Patient will ambulate 600 ft over level and uneven terrain with least restrictive device (vs no device) with supervision for safety. (TARGET 03/29/17)   Time 8   Period Weeks   Status New               Plan - 02/08/17 1343    Clinical Impression Statement Pt's balance improved at end of session compared to her performance at start of session;  pt appeared to have some anxiety at beginning of session but appeared to have incr. confidence with repetition of activities and encouragement   Rehab Potential Good   Clinical Impairments Affecting Rehab Potential see above   PT Frequency 2x / week   PT Duration 8 weeks   PT Treatment/Interventions ADLs/Self Care Home Management;Electrical Stimulation;Biofeedback;Neuromuscular re-education;Balance training;Therapeutic exercise;Therapeutic activities;Manual techniques;Functional mobility training;Stair training;Gait training;DME Instruction;Orthotic Fit/Training;Patient/family education;Vestibular   PT Next Visit Plan cont balance and gait   Consulted and Agree with Plan of Care Patient;Family member/caregiver   Family Member Consulted husband: [name]      Patient will benefit from skilled therapeutic intervention in order to improve the following deficits and impairments:  Abnormal gait, Pain, Decreased knowledge of use of DME, Decreased strength, Decreased mobility, Decreased balance, Decreased range of motion, Decreased coordination, Decreased safety awareness, Impaired flexibility, Impaired sensation  Visit Diagnosis: Unsteadiness on feet  Other disturbances of skin sensation  Other abnormalities of gait and mobility     Problem List Patient Active Problem List   Diagnosis  Date Noted  . Medication monitoring encounter 01/08/2017  . Gait abnormality 11/14/2016  . Hypotension due to drugs   . Urinary retention   . Anxiety about health   . Reactive depression   . Ataxia   . Abdominal spasms   . Constipation due to pain medication   . Acute lower UTI   . Dysuria   . Acute deep vein thrombosis (DVT) of popliteal vein of left lower extremity ([place])   . Incomplete paraplegia ([place])   . Acute blood loss anemia   . Neurogenic bladder   . Neuropathic pain   . Muscle spasm   . Gastroesophageal reflux disease   . Slow transit constipation   . Thrombocytopenia ([place]) 10/03/2016  . Abnormal MRI, spinal cord   . Encephalomyelitis   . Numbness   . Intractable back pain 09/20/2016  . Numbness of left lower extremity 09/20/2016  . Hyponatremia 09/20/2016  . Herpes zoster without complication 85/90/9311    Alda Lea, PT 02/08/2017, 1:46 PM  [facility] [address] [place], [name], [address] Phone: [phone]   Fax:  [phone]  Name: Katelyn Lamb MRN: [id number] Date of Birth: 1946-05-08

## 2017-02-11 ENCOUNTER — Ambulatory Visit: Payer: PPO | Admitting: Physical Therapy

## 2017-02-11 DIAGNOSIS — M6281 Muscle weakness (generalized): Secondary | ICD-10-CM

## 2017-02-11 DIAGNOSIS — R2681 Unsteadiness on feet: Secondary | ICD-10-CM

## 2017-02-11 DIAGNOSIS — R2689 Other abnormalities of gait and mobility: Secondary | ICD-10-CM

## 2017-02-12 DIAGNOSIS — M792 Neuralgia and neuritis, unspecified: Secondary | ICD-10-CM | POA: Diagnosis not present

## 2017-02-12 DIAGNOSIS — Z79899 Other long term (current) drug therapy: Secondary | ICD-10-CM | POA: Diagnosis not present

## 2017-02-12 DIAGNOSIS — G959 Disease of spinal cord, unspecified: Secondary | ICD-10-CM | POA: Diagnosis not present

## 2017-02-12 DIAGNOSIS — G0490 Encephalitis and encephalomyelitis, unspecified: Secondary | ICD-10-CM | POA: Diagnosis not present

## 2017-02-12 NOTE — Therapy (Signed)
[facility] [address] [place], [name], [address] Phone: [phone]   Fax:  [phone]  Physical Therapy Treatment  Patient Details  Name: Katelyn Lamb MRN: [id number] Date of Birth: Jul 06, 1946 Referring Provider: Carlos Levering, PA-C  Encounter Date: 02/11/2017      PT End of Session - 02/12/17 2208    Visit Number 9   Number of Visits 23   Date for PT Re-Evaluation 03/29/17   Authorization Type [employer]: G-CODE AND PROGRESS NOTE EVERY 10TH VISIT.    Authorization Time Period 02/04/17 to 04/05/17   PT Start Time 1532   PT Stop Time 1620   PT Time Calculation (min) 48 min   Equipment Utilized During Treatment Gait belt      Past Medical History:  Diagnosis Date  . Anxiety   . Back pain   . Gait abnormality 11/14/2016  . Myelitis due to herpes simplex [facility])     Past Surgical History:  Procedure Laterality Date  . ABDOMINAL HYSTERECTOMY    . BLADDER REPAIR    . CESAREAN SECTION    . TUBAL LIGATION      There were no vitals filed for this visit.      Subjective Assessment - 02/12/17 2153    Subjective Pt states she is walking better today than she was last week; saw Dr. Jannifer Franklin last Friday - he changed Gabapentin medication to Lyrica and she says it is helping with the pain   Patient is accompained by: Family member   Pertinent History LLE DVT, anxiety, B cataract surgery in 2017   Patient Stated Goals Pt would like to be IND in everything (walk, feed dog, and perform ADLs safely).    Currently in Pain? Yes   Pain Score 5    Pain Location Leg   Pain Orientation Right;Left   Pain Descriptors / Indicators Aching;Restless   Pain Type Neuropathic pain   Pain Onset More than a month ago   Pain Frequency Constant                         [facility] Adult PT Treatment/Exercise - 02/12/17 0001      Transfers   Transfers Sit to Stand;Stand to Sit   Sit to Stand 4: Min guard   Sit  to Stand Details (indicate cue type and reason) no UE support used   Number of Reps 10 reps     Ambulation/Gait   Ambulation/Gait Yes   Ambulation/Gait Assistance 4: Min guard   Ambulation/Gait Assistance Details no device used   Ambulation Distance (Feet) 250 Feet   Assistive device None   Gait Pattern Step-through pattern;Decreased arm swing - right;Decreased arm swing - left;Decreased step length - right;Decreased step length - left;Lateral hip instability;Decreased trunk rotation;Narrow base of support;Wide base of support   Ambulation Surface Level;Indoor   Stairs Yes   Stairs Assistance 4: Min guard   Stair Management Technique One rail Right;Step to pattern;Forwards   Number of Stairs 4   Height of Stairs 6     Knee/Hip Exercises: Standing   Forward Step Up Both;1 set;10 reps   Functional Squat 5 reps             Balance Exercises - 02/12/17 2207      Balance Exercises: Standing   Standing Eyes Opened Wide (BOA);Head turns;5 reps   Rockerboard Anterior/posterior;EO;30 seconds  inside // bars   Sidestepping 2 reps   Other Standing  Exercises Marching in place 10 reps each LE inside // bars      Alternate tap ups to 6" step with CGA Ladder to increase step length and to increase SLS with min hand held assist Stepping out/in with use of ladder for improved balance with lateral stepping Stepping over and back of balance beam with UE support prn with CGA - 10 reps each leg       PT Short Term Goals - 02/04/17 1704      PT SHORT TERM GOAL #1   Title ---See 01/09/17 note for prior STGs and results     PT SHORT TERM GOAL #2   Title ---     PT SHORT TERM GOAL #3   Title ---     PT SHORT TERM GOAL #4   Title ---     PT SHORT TERM GOAL #5   Title Patient/caregiver will be independent with updated HEP to improve strength and balance. (TARGET date 03/01/17)   Time 4   Period Weeks   Status New     Additional Short Term Goals   Additional Short Term Goals Yes      PT SHORT TERM GOAL #6   Title Patient will improve Berg balance to >=21/56 to demonstrate improved balance and lesser risk of falls. (TARGET date 03/01/17)   Time 4   Period Weeks   Status New     PT SHORT TERM GOAL #7   Title Patient will ambulate 300 ft with least restrictive assistive device vs no device with supervision. (TARGET date 03/01/17)   Time 4   Period Weeks   Status New           PT Long Term Goals - 02/04/17 2024      PT LONG TERM GOAL #1   Title Pt will improve gait speed to >/=2.54f/sec., no AD, to safely amb. in the community. TARGET DATE FOR ALL LTGS: 01/31/17   Baseline 6/4  2.21 ft/sec   Status Partially Met     PT LONG TERM GOAL #2   Title Pt will improve BERG score to >/=45/56 to decr. falls risk.    Baseline 6/4  14/56 (after hospitalized for medical decline)   Status Not Met     PT LONG TERM GOAL #3   Title Pt will amb. 600' over even/uneven terrain, at MOD I level, to progress to walking dog safely.    Baseline 6/4 requires min assist over level, indoor    Status Not Met     PT LONG TERM GOAL #4   Title Pt will verbalize understanding of fall prevention strategies to reduce falls risk.    Baseline 6/4 Husband can report education provided. He found no areas needing to be addressed at home. Pt unable to report on education.   Status Partially Met     PT LONG TERM GOAL #5   Title Patient/caregiver will be independent in updated HEP. (TARGET 03/29/17)   Time 8   Period Weeks   Status New     Additional Long Term Goals   Additional Long Term Goals Yes     PT LONG TERM GOAL #6   Title Patient will improve gait velocity to >2.62 ft/sec to indicate incr safety with community ambulation. (TARGET 03/29/17)   Time 8   Period Weeks   Status New     PT LONG TERM GOAL #7   Title Patient will improve Berg score to >= 28/56 to indicate improved balance and lesser  risk of falls. (TARGET 03/29/17)   Time 8   Period Weeks   Status New     PT LONG  TERM GOAL #8   Title Patient will ambulate 600 ft over level and uneven terrain with least restrictive device (vs no device) with supervision for safety. (TARGET 03/29/17)   Time 8   Period Weeks   Status New               Plan - 02/12/17 2210    Clinical Impression Statement Balance and gait much improved today compared to pt's performance at previous session; pt demonstrating ability to grip with Rt hand with left hand continuing to have decr. sensation resulting in decreased ability to maintain grip   Rehab Potential Good   Clinical Impairments Affecting Rehab Potential see above   PT Frequency 2x / week   PT Duration 8 weeks   PT Treatment/Interventions ADLs/Self Care Home Management;Electrical Stimulation;Biofeedback;Neuromuscular re-education;Balance training;Therapeutic exercise;Therapeutic activities;Manual techniques;Functional mobility training;Stair training;Gait training;DME Instruction;Orthotic Fit/Training;Patient/family education;Vestibular   PT Next Visit Plan Do G code; cont balance and gait   Consulted and Agree with Plan of Care Patient;Family member/caregiver   Family Member Consulted husband: [name]      Patient will benefit from skilled therapeutic intervention in order to improve the following deficits and impairments:  Abnormal gait, Pain, Decreased knowledge of use of DME, Decreased strength, Decreased mobility, Decreased balance, Decreased range of motion, Decreased coordination, Decreased safety awareness, Impaired flexibility, Impaired sensation  Visit Diagnosis: Unsteadiness on feet  Other abnormalities of gait and mobility  Muscle weakness (generalized)     Problem List Patient Active Problem List   Diagnosis Date Noted  . Medication monitoring encounter 01/08/2017  . Gait abnormality 11/14/2016  . Hypotension due to drugs   . Urinary retention   . Anxiety about health   . Reactive depression   . Ataxia   . Abdominal spasms   .  Constipation due to pain medication   . Acute lower UTI   . Dysuria   . Acute deep vein thrombosis (DVT) of popliteal vein of left lower extremity ([place])   . Incomplete paraplegia ([place])   . Acute blood loss anemia   . Neurogenic bladder   . Neuropathic pain   . Muscle spasm   . Gastroesophageal reflux disease   . Slow transit constipation   . Thrombocytopenia ([place]) 10/03/2016  . Abnormal MRI, spinal cord   . Encephalomyelitis   . Numbness   . Intractable back pain 09/20/2016  . Numbness of left lower extremity 09/20/2016  . Hyponatremia 09/20/2016  . Herpes zoster without complication 77/07/6578    Alda Lea, PT 02/12/2017, 10:15 PM  [facility] [address] [place], [name], [address] Phone: [phone]   Fax:  [phone]  Name: TYRIA SPRINGER MRN: [id number] Date of Birth: [birth year]

## 2017-02-15 DIAGNOSIS — R300 Dysuria: Secondary | ICD-10-CM | POA: Diagnosis not present

## 2017-02-18 ENCOUNTER — Ambulatory Visit: Payer: PPO | Admitting: Physical Therapy

## 2017-02-18 ENCOUNTER — Encounter: Payer: Self-pay | Admitting: Physical Therapy

## 2017-02-18 DIAGNOSIS — R29818 Other symptoms and signs involving the nervous system: Secondary | ICD-10-CM

## 2017-02-18 DIAGNOSIS — R2681 Unsteadiness on feet: Secondary | ICD-10-CM | POA: Diagnosis not present

## 2017-02-18 DIAGNOSIS — R2689 Other abnormalities of gait and mobility: Secondary | ICD-10-CM

## 2017-02-18 DIAGNOSIS — M6281 Muscle weakness (generalized): Secondary | ICD-10-CM

## 2017-02-18 NOTE — Therapy (Signed)
[facility] [address] [place], [name], [address] Phone: [phone]   Fax:  [phone]  Physical Therapy Treatment  Patient Details  Name: Katelyn Lamb MRN: [id number] Date of Birth: 09/12/1945 Referring Provider: Carlos Levering, PA-C  Encounter Date: 02/18/2017      PT End of Session - 02/18/17 1951    Visit Number 10   Number of Visits 23   Date for PT Re-Evaluation 03/29/17   Authorization Type [employer]: G-CODE AND PROGRESS NOTE EVERY 10TH VISIT.    Authorization Time Period 02/04/17 to 04/05/17   PT Start Time 1446   PT Stop Time 1530   PT Time Calculation (min) 44 min   Equipment Utilized During Treatment Gait belt   Activity Tolerance Patient tolerated treatment well   Behavior During Therapy WFL for tasks assessed/performed      Past Medical History:  Diagnosis Date  . Anxiety   . Back pain   . Gait abnormality 11/14/2016  . Myelitis due to herpes simplex [facility])     Past Surgical History:  Procedure Laterality Date  . ABDOMINAL HYSTERECTOMY    . BLADDER REPAIR    . CESAREAN SECTION    . TUBAL LIGATION      There were no vitals filed for this visit.      Subjective Assessment - 02/18/17 1455    Subjective Pateint reports that the increase in her medicine initially made her legs feel better. But after shopping with grandaughter on Friday, she woke up Saturday with much worse pain. Has gradually felt better, but still not as well as before shopping.    Patient is accompained by: Family member   Pertinent History LLE DVT, anxiety, B cataract surgery in 2017   Patient Stated Goals Pt would like to be IND in everything (walk, feed dog, and perform ADLs safely).    Currently in Pain? Yes   Pain Score 5    Pain Location Leg   Pain Orientation Right;Left   Pain Descriptors / Indicators Discomfort   Pain Type Neuropathic pain   Pain Onset More than a month ago   Pain Frequency Constant                          [facility] Adult PT Treatment/Exercise - 02/18/17 1530      Ambulation/Gait   Ambulation/Gait Assistance 4: Min guard   Ambulation/Gait Assistance Details --  appears unsteady however no overt LOB;    Ambulation Distance (Feet) 150 Feet  120, 50, 50   Assistive device None   Gait Pattern Step-through pattern;Decreased arm swing - right;Decreased arm swing - left;Decreased step length - right;Decreased step length - left;Lateral hip instability;Decreased trunk rotation;Wide base of support   Ambulation Surface Level   Gait velocity 3.40 ft/sec  20 ft/5.88 sec   Pre-Gait Activities staggered stance with anterior wt-shfiting "up tall" over front leg             Balance Exercises - 02/18/17 1942      Balance Exercises: Standing   Standing Eyes Opened Wide ([place]);Head turns;Foam/compliant surface   Standing Eyes Closed Wide (BOA);Foam/compliant surface   Tandem Stance Eyes open;Eyes closed;Foam/compliant surface;Intermittent upper extremity support  partial tandem   Rockerboard Anterior/posterior;EO;Intermittent UE support             PT Short Term Goals - 02/04/17 1704      PT SHORT TERM GOAL #1   Title ---See 01/09/17  note for prior STGs and results     PT SHORT TERM GOAL #2   Title ---     PT SHORT TERM GOAL #3   Title ---     PT SHORT TERM GOAL #4   Title ---     PT SHORT TERM GOAL #5   Title Patient/caregiver will be independent with updated HEP to improve strength and balance. (TARGET date 03/01/17)   Time 4   Period Weeks   Status New     Additional Short Term Goals   Additional Short Term Goals Yes     PT SHORT TERM GOAL #6   Title Patient will improve Berg balance to >=21/56 to demonstrate improved balance and lesser risk of falls. (TARGET date 03/01/17)   Time 4   Period Weeks   Status New     PT SHORT TERM GOAL #7   Title Patient will ambulate 300 ft with least restrictive assistive device vs no device with  supervision. (TARGET date 03/01/17)   Time 4   Period Weeks   Status New           PT Long Term Goals - 02/04/17 2024      PT LONG TERM GOAL #1   Title Pt will improve gait speed to >/=2.42f/sec., no AD, to safely amb. in the community. TARGET DATE FOR ALL LTGS: 01/31/17   Baseline 6/4  2.21 ft/sec   Status Partially Met     PT LONG TERM GOAL #2   Title Pt will improve BERG score to >/=45/56 to decr. falls risk.    Baseline 6/4  14/56 (after hospitalized for medical decline)   Status Not Met     PT LONG TERM GOAL #3   Title Pt will amb. 600' over even/uneven terrain, at MOD I level, to progress to walking dog safely.    Baseline 6/4 requires min assist over level, indoor    Status Not Met     PT LONG TERM GOAL #4   Title Pt will verbalize understanding of fall prevention strategies to reduce falls risk.    Baseline 6/4 Husband can report education provided. He found no areas needing to be addressed at home. Pt unable to report on education.   Status Partially Met     PT LONG TERM GOAL #5   Title Patient/caregiver will be independent in updated HEP. (TARGET 03/29/17)   Time 8   Period Weeks   Status New     Additional Long Term Goals   Additional Long Term Goals Yes     PT LONG TERM GOAL #6   Title Patient will improve gait velocity to >2.62 ft/sec to indicate incr safety with community ambulation. (TARGET 03/29/17)   Time 8   Period Weeks   Status New     PT LONG TERM GOAL #7   Title Patient will improve Berg score to >= 28/56 to indicate improved balance and lesser risk of falls. (TARGET 03/29/17)   Time 8   Period Weeks   Status New     PT LONG TERM GOAL #8   Title Patient will ambulate 600 ft over level and uneven terrain with least restrictive device (vs no device) with supervision for safety. (TARGET 03/29/17)   Time 8   Period Weeks   Status New               Plan - 02/18/17 1955    Clinical Impression Statement Session focused on gait and balance  training.  Patient continues to improve and will benefit from continued PT.    Rehab Potential Good   Clinical Impairments Affecting Rehab Potential see above   PT Frequency 2x / week   PT Duration 8 weeks   PT Treatment/Interventions ADLs/Self Care Home Management;Electrical Stimulation;Biofeedback;Neuromuscular re-education;Balance training;Therapeutic exercise;Therapeutic activities;Manual techniques;Functional mobility training;Stair training;Gait training;DME Instruction;Orthotic Fit/Training;Patient/family education;Vestibular   PT Next Visit Plan Discuss ?restart OT (was placed on hold due to too fatigued for 2 sessions back to back; PT is to let Santiago Glad, OT know when pt ready to resume); cont balance, gait, strengthening   Consulted and Agree with Plan of Care Patient;Family member/caregiver   Family Member Consulted husband: [name]      Patient will benefit from skilled therapeutic intervention in order to improve the following deficits and impairments:  Abnormal gait, Pain, Decreased knowledge of use of DME, Decreased strength, Decreased mobility, Decreased balance, Decreased range of motion, Decreased coordination, Decreased safety awareness, Impaired flexibility, Impaired sensation  Visit Diagnosis: Unsteadiness on feet  Other abnormalities of gait and mobility  Muscle weakness (generalized)  Other symptoms and signs involving the nervous system       G-Codes - 03/02/17 1952    Functional Assessment Tool Used (Outpatient Only) gait velocity 3.40 ft/sec    Functional Limitation Mobility: Walking and moving around   Mobility: Walking and Moving Around Current Status [phone]) At least 40 percent but less than 60 percent impaired, limited or restricted   Mobility: Walking and Moving Around Goal Status [phone]) At least 20 percent but less than 40 percent impaired, limited or restricted      Problem List Patient Active Problem List   Diagnosis Date Noted  . Medication monitoring  encounter 01/08/2017  . Gait abnormality 11/14/2016  . Hypotension due to drugs   . Urinary retention   . Anxiety about health   . Reactive depression   . Ataxia   . Abdominal spasms   . Constipation due to pain medication   . Acute lower UTI   . Dysuria   . Acute deep vein thrombosis (DVT) of popliteal vein of left lower extremity ([place])   . Incomplete paraplegia ([place])   . Acute blood loss anemia   . Neurogenic bladder   . Neuropathic pain   . Muscle spasm   . Gastroesophageal reflux disease   . Slow transit constipation   . Thrombocytopenia ([place]) 10/03/2016  . Abnormal MRI, spinal cord   . Encephalomyelitis   . Numbness   . Intractable back pain 09/20/2016  . Numbness of left lower extremity 09/20/2016  . Hyponatremia 09/20/2016  . Herpes zoster without complication 20/80/2233   Physical Therapy Progress Note  Dates of Reporting Period: 12/06/16 to 03/02/2017  (pt with illness and missed sessions)  Objective Reports of Subjective Statement:   Objective Measurements: Gait velocity up to 3.40 ft/sec  Goal Update: see above  Plan: see above  Reason Skilled Services are Required: for balance and gait training to reduce risk of falls   Rexanne Mano, PT 03/02/17, 7:59 PM  [facility] [address] [place], [name], [address] Phone: [phone]   Fax:  [phone]  Name: JONETTA DAGLEY MRN: [id number] Date of Birth: Apr 21, 1946

## 2017-02-22 ENCOUNTER — Ambulatory Visit: Payer: PPO | Admitting: Physical Therapy

## 2017-02-22 ENCOUNTER — Encounter: Payer: Self-pay | Admitting: Physical Therapy

## 2017-02-22 DIAGNOSIS — R278 Other lack of coordination: Secondary | ICD-10-CM

## 2017-02-22 DIAGNOSIS — R2681 Unsteadiness on feet: Secondary | ICD-10-CM | POA: Diagnosis not present

## 2017-02-22 DIAGNOSIS — M6281 Muscle weakness (generalized): Secondary | ICD-10-CM

## 2017-02-22 NOTE — Therapy (Signed)
[facility] [address] [place], [name], [address] Phone: [phone]   Fax:  [phone]  Physical Therapy Treatment  Patient Details  Name: Katelyn Lamb MRN: [id number] Date of Birth: [birth year] Referring Provider: Carlos Levering, PA-C  Encounter Date: 02/22/2017      PT End of Session - 02/22/17 1645    Visit Number 11   Number of Visits 23   Date for PT Re-Evaluation 03/29/17   Authorization Type [employer]: G-CODE AND PROGRESS NOTE EVERY 10TH VISIT.    Authorization Time Period 02/04/17 to 04/05/17   PT Start Time 1403   PT Stop Time 1445   PT Time Calculation (min) 42 min   Equipment Utilized During Treatment Gait belt   Activity Tolerance Patient tolerated treatment well;No increased pain   Behavior During Therapy WFL for tasks assessed/performed      Past Medical History:  Diagnosis Date  . Anxiety   . Back pain   . Gait abnormality 11/14/2016  . Myelitis due to herpes simplex [facility])     Past Surgical History:  Procedure Laterality Date  . ABDOMINAL HYSTERECTOMY    . BLADDER REPAIR    . CESAREAN SECTION    . TUBAL LIGATION      There were no vitals filed for this visit.      Subjective Assessment - 02/22/17 1411    Subjective Nerve pain is worse today (and yesterday). Slipped off the toilet again (sitting on 3n1 with lid closed to do sponge bath). Her and husband are unsure how this happened (he had stepped out to get something).   Patient is accompained by: Family member   Pertinent History LLE DVT, anxiety, B cataract surgery in 2017   Patient Stated Goals Pt would like to be IND in everything (walk, feed dog, and perform ADLs safely).    Currently in Pain? Yes   Pain Score 10-Worst pain ever   Pain Location Hand   Pain Orientation Right;Left   Pain Descriptors / Indicators Grimacing;Discomfort   Pain Type Neuropathic pain   Pain Onset More than a month ago   Pain Frequency  Constant                         [facility] Adult PT Treatment/Exercise - 02/22/17 1447      Transfers   Transfers Sit to Stand;Stand to [employer] Transfers   Sit to Stand 4: Min guard;Without upper extremity assist   Sit to Stand Details (indicate cue type and reason) slightly unsteady, uses wide BOS   Stand to Sit 4: Min guard;With upper extremity assist   Stand Pivot Transfers 4: Min guard   Transfer Cueing Pt tends to begin to reach down for armrest and turn as she approaches seat. ?partly why she fell off 3n1 as she went to sit down. Practiced staying upright and turning her back to the chair before she begins to bend and reach for armrest     Ambulation/Gait   Ambulation/Gait Assistance 4: Min guard   Ambulation/Gait Assistance Details holding gait belt due to pt's unsteady, wide stance with staggering; gait improves as she walks farther;    Ambulation Distance (Feet) 100 Feet  180,   Assistive device None   Gait Pattern Step-through pattern;Decreased arm swing - right;Decreased arm swing - left;Decreased step length - right;Decreased step length - left;Lateral hip instability;Decreased trunk rotation;Wide base of support   Ambulation Surface Level  Balance Exercises - 02/22/17 1637      Balance Exercises: Standing   Standing Eyes Closed Narrow base of support (BOS);Wide (BOA);Foam/compliant surface;30 secs   SLS with Vectors Foam/compliant surface  single cone taps, alt legs; imblance needed min assist   Stepping Strategy Anterior;Posterior;Foam/compliant surface;10 reps   Rockerboard Anterior/posterior;Head turns;EC;Intermittent UE support  //bars; pt shows delayed response to imbalance at times   Retro Gait Foam/compliant surface   Sidestepping Foam/compliant support  4 in //bars, 2 on red mat; legs straight vs bent   Marching Limitations on red mat, in place, fwd and backward with min assist when loses balance   Other Standing Exercises  anterior step off/back on with rockerboard in // bars           PT Education - 02/22/17 1643    Education provided Yes   Education Details ?time to resume OT (they have scheduled OT eval); safe stand to sit transfers   Person(s) Educated Patient   Methods Explanation;Demonstration;Verbal cues   Comprehension Verbalized understanding;Verbal cues required;Need further instruction          PT Short Term Goals - 02/04/17 1704      PT SHORT TERM GOAL #1   Title ---See 01/09/17 note for prior STGs and results     PT SHORT TERM GOAL #2   Title ---     PT SHORT TERM GOAL #3   Title ---     PT SHORT TERM GOAL #4   Title ---     PT SHORT TERM GOAL #5   Title Patient/caregiver will be independent with updated HEP to improve strength and balance. (TARGET date 03/01/17)   Time 4   Period Weeks   Status New     Additional Short Term Goals   Additional Short Term Goals Yes     PT SHORT TERM GOAL #6   Title Patient will improve Berg balance to >=21/56 to demonstrate improved balance and lesser risk of falls. (TARGET date 03/01/17)   Time 4   Period Weeks   Status New     PT SHORT TERM GOAL #7   Title Patient will ambulate 300 ft with least restrictive assistive device vs no device with supervision. (TARGET date 03/01/17)   Time 4   Period Weeks   Status New           PT Long Term Goals - 02/04/17 2024      PT LONG TERM GOAL #1   Title Pt will improve gait speed to >/=2.30f/sec., no AD, to safely amb. in the community. TARGET DATE FOR ALL LTGS: 01/31/17   Baseline 6/4  2.21 ft/sec   Status Partially Met     PT LONG TERM GOAL #2   Title Pt will improve BERG score to >/=45/56 to decr. falls risk.    Baseline 6/4  14/56 (after hospitalized for medical decline)   Status Not Met     PT LONG TERM GOAL #3   Title Pt will amb. 600' over even/uneven terrain, at MOD I level, to progress to walking dog safely.    Baseline 6/4 requires min assist over level, indoor    Status  Not Met     PT LONG TERM GOAL #4   Title Pt will verbalize understanding of fall prevention strategies to reduce falls risk.    Baseline 6/4 Husband can report education provided. He found no areas needing to be addressed at home. Pt unable to report on education.   Status Partially  Met     PT LONG TERM GOAL #5   Title Patient/caregiver will be independent in updated HEP. (TARGET 03/29/17)   Time 8   Period Weeks   Status New     Additional Long Term Goals   Additional Long Term Goals Yes     PT LONG TERM GOAL #6   Title Patient will improve gait velocity to >2.62 ft/sec to indicate incr safety with community ambulation. (TARGET 03/29/17)   Time 8   Period Weeks   Status New     PT LONG TERM GOAL #7   Title Patient will improve Berg score to >= 28/56 to indicate improved balance and lesser risk of falls. (TARGET 03/29/17)   Time 8   Period Weeks   Status New     PT LONG TERM GOAL #8   Title Patient will ambulate 600 ft over level and uneven terrain with least restrictive device (vs no device) with supervision for safety. (TARGET 03/29/17)   Time 8   Period Weeks   Status New               Plan - 02/22/17 1646    Clinical Impression Statement Session focused on balance, gait training and safety with transfers. Husband present for all education. Patient's neuropathic pain is worsening per pt. Her balance however was a bit better today. Will continue with PT with plans to check STGs end of next week.    Rehab Potential Good   Clinical Impairments Affecting Rehab Potential see above   PT Frequency 2x / week   PT Duration 8 weeks   PT Treatment/Interventions ADLs/Self Care Home Management;Electrical Stimulation;Biofeedback;Neuromuscular re-education;Balance training;Therapeutic exercise;Therapeutic activities;Manual techniques;Functional mobility training;Stair training;Gait training;DME Instruction;Orthotic Fit/Training;Patient/family education;Vestibular   PT Next Visit  Plan cont balance, gait, strengthening (sometimes very delayed response to LOB and other times she self-corrects quickly)   Consulted and Agree with Plan of Care Patient;Family member/caregiver   Family Member Consulted husband: [name]      Patient will benefit from skilled therapeutic intervention in order to improve the following deficits and impairments:  Abnormal gait, Pain, Decreased knowledge of use of DME, Decreased strength, Decreased mobility, Decreased balance, Decreased range of motion, Decreased coordination, Decreased safety awareness, Impaired flexibility, Impaired sensation  Visit Diagnosis: Unsteadiness on feet  Muscle weakness (generalized)  Other lack of coordination     Problem List Patient Active Problem List   Diagnosis Date Noted  . Medication monitoring encounter 01/08/2017  . Gait abnormality 11/14/2016  . Hypotension due to drugs   . Urinary retention   . Anxiety about health   . Reactive depression   . Ataxia   . Abdominal spasms   . Constipation due to pain medication   . Acute lower UTI   . Dysuria   . Acute deep vein thrombosis (DVT) of popliteal vein of left lower extremity ([place])   . Incomplete paraplegia ([place])   . Acute blood loss anemia   . Neurogenic bladder   . Neuropathic pain   . Muscle spasm   . Gastroesophageal reflux disease   . Slow transit constipation   . Thrombocytopenia ([place]) 10/03/2016  . Abnormal MRI, spinal cord   . Encephalomyelitis   . Numbness   . Intractable back pain 09/20/2016  . Numbness of left lower extremity 09/20/2016  . Hyponatremia 09/20/2016  . Herpes zoster without complication 25/63/8937    Rexanne Mano, PT 02/22/2017, 4:54 PM  [facility] [address]  [place], [name], [address] Phone: [phone]   Fax:  [phone]  Name: FLORESTINE CARMICAL MRN: [id number] Date of Birth: Aug 27, 1946

## 2017-02-26 ENCOUNTER — Ambulatory Visit: Payer: PPO | Admitting: Physical Therapy

## 2017-02-26 DIAGNOSIS — R2681 Unsteadiness on feet: Secondary | ICD-10-CM | POA: Diagnosis not present

## 2017-02-26 DIAGNOSIS — R2689 Other abnormalities of gait and mobility: Secondary | ICD-10-CM

## 2017-02-26 DIAGNOSIS — M6281 Muscle weakness (generalized): Secondary | ICD-10-CM

## 2017-02-26 NOTE — Therapy (Signed)
[facility] [address] [place], [name], [address] Phone: [phone]   Fax:  [phone]  Physical Therapy Treatment  Patient Details  Name: Katelyn Lamb MRN: [id number] Date of Birth: 05-15-1946 Referring Provider: Carlos Levering, PA-C  Encounter Date: 02/26/2017      PT End of Session - 02/26/17 2145    Visit Number 12  G2   Number of Visits 23   Date for PT Re-Evaluation 03/29/17   Authorization Type [employer]: G-CODE AND PROGRESS NOTE EVERY 10TH VISIT.    Authorization Time Period 02/04/17 to 04/05/17   PT Start Time 1447   PT Stop Time 1531   PT Time Calculation (min) 44 min      Past Medical History:  Diagnosis Date  . Anxiety   . Back pain   . Gait abnormality 11/14/2016  . Myelitis due to herpes simplex [facility])     Past Surgical History:  Procedure Laterality Date  . ABDOMINAL HYSTERECTOMY    . BLADDER REPAIR    . CESAREAN SECTION    . TUBAL LIGATION      There were no vitals filed for this visit.      Subjective Assessment - 02/26/17 1621    Subjective Pt states she feels balance is slowly getting better - states her status continues to fluctuate from day to day and says she continues to have nerve pain   Patient is accompained by: Family member   Pertinent History LLE DVT, anxiety, B cataract surgery in 2017   Patient Stated Goals Pt would like to be IND in everything (walk, feed dog, and perform ADLs safely).    Currently in Pain? Yes   Pain Score 3    Pain Location Back   Pain Orientation Mid   Pain Descriptors / Indicators Aching;Sore   Pain Type Chronic pain   Pain Onset More than a month ago   Pain Frequency Intermittent                         [facility] Adult PT Treatment/Exercise - 02/26/17 1512      Ambulation/Gait   Ambulation/Gait Yes   Ambulation/Gait Assistance 4: Min guard   Ambulation/Gait Assistance Details close SBA to CGA for safety:  cues for  pt to incr. arm swing   Ambulation Distance (Feet) 350 Feet   Assistive device None   Gait Pattern Step-through pattern;Decreased arm swing - right;Decreased arm swing - left;Decreased step length - right;Decreased step length - left;Lateral hip instability;Decreased trunk rotation;Wide base of support   Ambulation Surface Level;Indoor     Knee/Hip Exercises: Stretches   Press photographer Both;1 rep;30 seconds  2" step used     Knee/Hip Exercises: Aerobic   Elliptical 1" 35 secs at level 1.5  pt stopped at this time due to fatigue     Knee/Hip Exercises: Standing   Heel Raises Both;1 set;10 reps             Balance Exercises - 02/26/17 2143      Balance Exercises: Standing   Standing Eyes Opened Wide (BOA);Foam/compliant surface;5 reps  longest standing without UE support 10 secs on balance beam   Rockerboard Anterior/posterior;EO;30 seconds  inside // bars   Sidestepping 4 reps  with squats inside // bars without UE support   Other Standing Exercises Marching in place 10 reps each LE inside // bars     Pt performed marching 35' x 2 reps with  CGA - cues to decr. Speed to incr. SLS  Stepping over and back of balance beam inside // bars with minimal UE support Crossovers front and back 10' x 2 reps each inside // bars        PT Short Term Goals - 02/04/17 1704      PT SHORT TERM GOAL #1   Title ---See 01/09/17 note for prior STGs and results     PT SHORT TERM GOAL #2   Title ---     PT SHORT TERM GOAL #3   Title ---     PT SHORT TERM GOAL #4   Title ---     PT SHORT TERM GOAL #5   Title Patient/caregiver will be independent with updated HEP to improve strength and balance. (TARGET date 03/01/17)   Time 4   Period Weeks   Status New     Additional Short Term Goals   Additional Short Term Goals Yes     PT SHORT TERM GOAL #6   Title Patient will improve Berg balance to >=21/56 to demonstrate improved balance and lesser risk of falls. (TARGET date 03/01/17)    Time 4   Period Weeks   Status New     PT SHORT TERM GOAL #7   Title Patient will ambulate 300 ft with least restrictive assistive device vs no device with supervision. (TARGET date 03/01/17)   Time 4   Period Weeks   Status New           PT Long Term Goals - 02/04/17 2024      PT LONG TERM GOAL #1   Title Pt will improve gait speed to >/=2.4f/sec., no AD, to safely amb. in the community. TARGET DATE FOR ALL LTGS: 01/31/17   Baseline 6/4  2.21 ft/sec   Status Partially Met     PT LONG TERM GOAL #2   Title Pt will improve BERG score to >/=45/56 to decr. falls risk.    Baseline 6/4  14/56 (after hospitalized for medical decline)   Status Not Met     PT LONG TERM GOAL #3   Title Pt will amb. 600' over even/uneven terrain, at MOD I level, to progress to walking dog safely.    Baseline 6/4 requires min assist over level, indoor    Status Not Met     PT LONG TERM GOAL #4   Title Pt will verbalize understanding of fall prevention strategies to reduce falls risk.    Baseline 6/4 Husband can report education provided. He found no areas needing to be addressed at home. Pt unable to report on education.   Status Partially Met     PT LONG TERM GOAL #5   Title Patient/caregiver will be independent in updated HEP. (TARGET 03/29/17)   Time 8   Period Weeks   Status New     Additional Long Term Goals   Additional Long Term Goals Yes     PT LONG TERM GOAL #6   Title Patient will improve gait velocity to >2.62 ft/sec to indicate incr safety with community ambulation. (TARGET 03/29/17)   Time 8   Period Weeks   Status New     PT LONG TERM GOAL #7   Title Patient will improve Berg score to >= 28/56 to indicate improved balance and lesser risk of falls. (TARGET 03/29/17)   Time 8   Period Weeks   Status New     PT LONG TERM GOAL #8   Title Patient will  ambulate 600 ft over level and uneven terrain with least restrictive device (vs no device) with supervision for safety. (TARGET  03/29/17)   Time 8   Period Weeks   Status New               Plan - 02/26/17 2146    Clinical Impression Statement Balance improved today as pt able to amb. with intermittent assistance; also noted improvement in Lt hand grip with pt able to hold bar on elliptical without hand falling off of bar; pt cont to c/o neuropathic pain   Rehab Potential Good   Clinical Impairments Affecting Rehab Potential see above   PT Frequency 2x / week   PT Duration 8 weeks   PT Treatment/Interventions ADLs/Self Care Home Management;Electrical Stimulation;Biofeedback;Neuromuscular re-education;Balance training;Therapeutic exercise;Therapeutic activities;Manual techniques;Functional mobility training;Stair training;Gait training;DME Instruction;Orthotic Fit/Training;Patient/family education;Vestibular   PT Next Visit Plan check STG's (due 03-01-17)   Consulted and Agree with Plan of Care Patient;Family member/caregiver   Family Member Consulted husband: [name]      Patient will benefit from skilled therapeutic intervention in order to improve the following deficits and impairments:  Abnormal gait, Pain, Decreased knowledge of use of DME, Decreased strength, Decreased mobility, Decreased balance, Decreased range of motion, Decreased coordination, Decreased safety awareness, Impaired flexibility, Impaired sensation  Visit Diagnosis: Unsteadiness on feet  Other abnormalities of gait and mobility  Muscle weakness (generalized)     Problem List Patient Active Problem List   Diagnosis Date Noted  . Medication monitoring encounter 01/08/2017  . Gait abnormality 11/14/2016  . Hypotension due to drugs   . Urinary retention   . Anxiety about health   . Reactive depression   . Ataxia   . Abdominal spasms   . Constipation due to pain medication   . Acute lower UTI   . Dysuria   . Acute deep vein thrombosis (DVT) of popliteal vein of left lower extremity ([place])   . Incomplete paraplegia ([place])   .  Acute blood loss anemia   . Neurogenic bladder   . Neuropathic pain   . Muscle spasm   . Gastroesophageal reflux disease   . Slow transit constipation   . Thrombocytopenia ([place]) 10/03/2016  . Abnormal MRI, spinal cord   . Encephalomyelitis   . Numbness   . Intractable back pain 09/20/2016  . Numbness of left lower extremity 09/20/2016  . Hyponatremia 09/20/2016  . Herpes zoster without complication 25/00/3704    Alda Lea, PT 02/26/2017, 9:52 PM  [facility] [address] [place], [name], [address] Phone: [phone]   Fax:  [phone]  Name: JANAYA BROY MRN: [id number] Date of Birth: [birth year]

## 2017-02-27 ENCOUNTER — Ambulatory Visit: Payer: PPO | Admitting: Neurology

## 2017-03-01 ENCOUNTER — Ambulatory Visit: Payer: PPO | Admitting: Physical Therapy

## 2017-03-01 ENCOUNTER — Encounter: Payer: Self-pay | Admitting: Physical Therapy

## 2017-03-01 DIAGNOSIS — R2681 Unsteadiness on feet: Secondary | ICD-10-CM | POA: Diagnosis not present

## 2017-03-01 DIAGNOSIS — M6281 Muscle weakness (generalized): Secondary | ICD-10-CM

## 2017-03-01 DIAGNOSIS — R278 Other lack of coordination: Secondary | ICD-10-CM

## 2017-03-01 NOTE — Therapy (Signed)
[facility] [address] [place], [name], [address] Phone: [phone]   Fax:  [phone]  Physical Therapy Treatment  Patient Details  Name: Katelyn Lamb MRN: [id number] Date of Birth: 1946-01-02 Referring Provider: Carlos Levering, PA-C  Encounter Date: 03/01/2017      PT End of Session - 03/01/17 5916    Visit Number 13  G3   Number of Visits 23   Date for PT Re-Evaluation 03/29/17   Authorization Type [employer]: G-CODE AND PROGRESS NOTE EVERY 10TH VISIT.    Authorization Time Period 02/04/17 to 04/05/17   PT Start Time 1537   PT Stop Time 1615   PT Time Calculation (min) 38 min   Equipment Utilized During Treatment Gait belt   Activity Tolerance Patient tolerated treatment well   Behavior During Therapy WFL for tasks assessed/performed      Past Medical History:  Diagnosis Date  . Anxiety   . Back pain   . Gait abnormality 11/14/2016  . Myelitis due to herpes simplex [facility])     Past Surgical History:  Procedure Laterality Date  . ABDOMINAL HYSTERECTOMY    . BLADDER REPAIR    . CESAREAN SECTION    . TUBAL LIGATION      There were no vitals filed for this visit.      Subjective Assessment - 03/01/17 1539    Subjective Had a good day yesterday. Has decided if this is her new normal, she needs to learn how to do things and can't just wait to get better.    Patient is accompained by: Family member   Pertinent History LLE DVT, anxiety, B cataract surgery in 2017   Patient Stated Goals Pt would like to be IND in everything (walk, feed dog, and perform ADLs safely).    Currently in Pain? Yes   Pain Score 6    Pain Location Back   Pain Orientation Mid   Pain Descriptors / Indicators Aching   Pain Type Chronic pain   Pain Onset More than a month ago                         [facility] Adult PT Treatment/Exercise - 03/01/17 1550      Ambulation/Gait   Ambulation/Gait Assistance  4: Min guard;4: Min assist   Ambulation/Gait Assistance Details closeguarding to min assist due to imbalance, typically drifts to her right, also caught her rt toe/foot 3x with significant LOB; incorporated carrying light object bimanual grasp, head turns, turns, sudden stops, change in velocity   Ambulation Distance (Feet) 600 Feet  300   Assistive device None   Gait Pattern Step-through pattern;Decreased arm swing - right;Decreased arm swing - left;Decreased step length - right;Decreased step length - left;Lateral hip instability;Decreased trunk rotation;Poor foot clearance - right;Narrow base of support;Wide base of support   Ambulation Surface Level   Stairs Assistance 4: Min guard   Stair Management Technique One rail Right;Step to pattern;Forwards   Number of Stairs 4   Height of Stairs 6     Berg Balance Test   Sit to Stand Able to stand without using hands and stabilize independently   Standing Unsupported Able to stand safely 2 minutes   Sitting with Back Unsupported but Feet Supported on Floor or Stool Able to sit safely and securely 2 minutes   Stand to Sit Sits safely with minimal use of hands   Transfers Able to transfer safely, minor use of  hands   Standing Unsupported with Eyes Closed Able to stand 10 seconds safely   Standing Ubsupported with Feet Together Able to place feet together independently and stand for 1 minute with supervision   From Standing, Reach Forward with Outstretched Arm Can reach confidently >25 cm (10")   From Standing Position, Pick up Object from [place] to pick up shoe safely and easily   From Standing Position, Turn to Look Behind Over each Shoulder Looks behind from both sides and weight shifts well   Turn 360 Degrees Able to turn 360 degrees safely in 4 seconds or less   Standing Unsupported, Alternately Place Feet on Step/Stool Able to complete >2 steps/needs minimal assist   Standing Unsupported, One Foot in Front Needs help to step but can hold  15 seconds   Standing on One Leg Tries to lift leg/unable to hold 3 seconds but remains standing independently   Total Score 46             Balance Exercises - 03/01/17 1631      Balance Exercises: Standing   Tandem Stance Eyes open;Intermittent upper extremity support;3 reps  at most 9 sec   SLS Eyes open;Solid surface;Intermittent upper extremity support   Gait with Head Turns Forward  lt, rt, diagonal           PT Education - 03/01/17 1633    Education provided Yes   Education Details balance is better when she is focused, deteriorates significantly when she is multi-tasking   Person(s) Educated Patient;Spouse   Methods Explanation;Demonstration   Comprehension Verbalized understanding          PT Short Term Goals - 03/01/17 1634      PT SHORT TERM GOAL #1   Title Patient/caregiver will be independent with updated HEP to improve strength and balance. (TARGET date 03/01/17)   Time 4   Period Weeks   Status Achieved     PT SHORT TERM GOAL #2   Title Patient will improve Berg balance to >=21/56 to demonstrate improved balance and lesser risk of falls. (TARGET date 03/01/17)   Baseline 6/29 46/56   Time 4   Period Weeks   Status Achieved     PT SHORT TERM GOAL #3   Title Patient will ambulate 300 ft with least restrictive assistive device vs no device with supervision. (TARGET date 03/01/17)   Baseline 6/29 minguard due to imbalance   Time 4   Period Weeks   Status Partially Met           PT Long Term Goals - 02/04/17 2024      PT LONG TERM GOAL #1   Title Pt will improve gait speed to >/=2.67f/sec., no AD, to safely amb. in the community. TARGET DATE FOR ALL LTGS: 01/31/17   Baseline 6/4  2.21 ft/sec   Status Partially Met     PT LONG TERM GOAL #2   Title Pt will improve BERG score to >/=45/56 to decr. falls risk.    Baseline 6/4  14/56 (after hospitalized for medical decline)   Status Not Met     PT LONG TERM GOAL #3   Title Pt will amb. 600'  over even/uneven terrain, at MOD I level, to progress to walking dog safely.    Baseline 6/4 requires min assist over level, indoor    Status Not Met     PT LONG TERM GOAL #4   Title Pt will verbalize understanding of fall prevention strategies to reduce  falls risk.    Baseline 6/4 Husband can report education provided. He found no areas needing to be addressed at home. Pt unable to report on education.   Status Partially Met     PT LONG TERM GOAL #5   Title Patient/caregiver will be independent in updated HEP. (TARGET 03/29/17)   Time 8   Period Weeks   Status New     Additional Long Term Goals   Additional Long Term Goals Yes     PT LONG TERM GOAL #6   Title Patient will improve gait velocity to >2.62 ft/sec to indicate incr safety with community ambulation. (TARGET 03/29/17)   Time 8   Period Weeks   Status New     PT LONG TERM GOAL #7   Title Patient will improve Berg score to >= 28/56 to indicate improved balance and lesser risk of falls. (TARGET 03/29/17)   Time 8   Period Weeks   Status New     PT LONG TERM GOAL #8   Title Patient will ambulate 600 ft over level and uneven terrain with least restrictive device (vs no device) with supervision for safety. (TARGET 03/29/17)   Time 8   Period Weeks   Status New               Plan - 03/01/17 1638    Clinical Impression Statement Assessed STGs with pt meeting 2 of 3 goals and partially meeting 3rd goal (met distance, not level of assist). Session focused on balance and gait training with cognitive challenges/multi-tasking. Pt will continue to benefift from continued PT.    Rehab Potential Good   Clinical Impairments Affecting Rehab Potential see above   PT Frequency 2x / week   PT Duration 8 weeks   PT Treatment/Interventions ADLs/Self Care Home Management;Electrical Stimulation;Biofeedback;Neuromuscular re-education;Balance training;Therapeutic exercise;Therapeutic activities;Manual techniques;Functional mobility  training;Stair training;Gait training;DME Instruction;Orthotic Fit/Training;Patient/family education;Vestibular   PT Next Visit Plan core stability (?wall bumps on foam; ?tall kneeling), gait with challenges (naming things, toss and catch soft ball, etc); balance and gait training   Consulted and Agree with Plan of Care Patient;Family member/caregiver   Family Member Consulted husband: [name]      Patient will benefit from skilled therapeutic intervention in order to improve the following deficits and impairments:  Abnormal gait, Pain, Decreased knowledge of use of DME, Decreased strength, Decreased mobility, Decreased balance, Decreased range of motion, Decreased coordination, Decreased safety awareness, Impaired flexibility, Impaired sensation  Visit Diagnosis: Other lack of coordination  Muscle weakness (generalized)  Unsteadiness on feet     Problem List Patient Active Problem List   Diagnosis Date Noted  . Medication monitoring encounter 01/08/2017  . Gait abnormality 11/14/2016  . Hypotension due to drugs   . Urinary retention   . Anxiety about health   . Reactive depression   . Ataxia   . Abdominal spasms   . Constipation due to pain medication   . Acute lower UTI   . Dysuria   . Acute deep vein thrombosis (DVT) of popliteal vein of left lower extremity ([place])   . Incomplete paraplegia ([place])   . Acute blood loss anemia   . Neurogenic bladder   . Neuropathic pain   . Muscle spasm   . Gastroesophageal reflux disease   . Slow transit constipation   . Thrombocytopenia ([place]) 10/03/2016  . Abnormal MRI, spinal cord   . Encephalomyelitis   . Numbness   . Intractable back pain 09/20/2016  . Numbness of left lower  extremity 09/20/2016  . Hyponatremia 09/20/2016  . Herpes zoster without complication 16/06/9603    Rexanne Mano, PT 03/01/2017, 4:46 PM  [facility] [address] [place], [name],  [address] Phone: [phone]   Fax:  [phone]  Name: Katelyn Lamb MRN: [id number] Date of Birth: Apr 23, 1946

## 2017-03-04 ENCOUNTER — Telehealth: Payer: Self-pay | Admitting: Neurology

## 2017-03-04 MED ORDER — PREGABALIN 50 MG PO CAPS: ORAL_CAPSULE | ORAL | 3 refills | Status: DC

## 2017-03-04 NOTE — Telephone Encounter (Signed)
Pt said she is still having a hard time getting up in the morning, very stiff with nerve pain. Back is worse, feels like she can hardly walk. Pt is still using lyrica and cymbalta as directed, she d/c gabapentin. Testing done at [facility] has all been negative. Pt is sleeping at night but when getting up to go the bathroom her husband has to help her. Please call

## 2017-03-04 NOTE — Telephone Encounter (Signed)
I called patient. She has increased stiffness and discomfort in the morning, she initially got benefit with the Cymbalta and the Lyrica, I will go up on the Lyrica taking 50 mg in the morning and 100 mg in the evening. A prescription was sent in.

## 2017-03-05 NOTE — Telephone Encounter (Signed)
Lyrica rx faxed and confirmed to [facility] at [phone].

## 2017-03-11 ENCOUNTER — Ambulatory Visit: Payer: PPO | Attending: Family Medicine | Admitting: Physical Therapy

## 2017-03-11 ENCOUNTER — Ambulatory Visit: Payer: PPO | Admitting: Occupational Therapy

## 2017-03-11 ENCOUNTER — Encounter: Payer: Self-pay | Admitting: Physical Therapy

## 2017-03-11 DIAGNOSIS — R29818 Other symptoms and signs involving the nervous system: Secondary | ICD-10-CM

## 2017-03-11 DIAGNOSIS — R2689 Other abnormalities of gait and mobility: Secondary | ICD-10-CM

## 2017-03-11 DIAGNOSIS — R41844 Frontal lobe and executive function deficit: Secondary | ICD-10-CM | POA: Diagnosis not present

## 2017-03-11 DIAGNOSIS — M6281 Muscle weakness (generalized): Secondary | ICD-10-CM

## 2017-03-11 DIAGNOSIS — R2681 Unsteadiness on feet: Secondary | ICD-10-CM | POA: Diagnosis not present

## 2017-03-11 DIAGNOSIS — R208 Other disturbances of skin sensation: Secondary | ICD-10-CM | POA: Insufficient documentation

## 2017-03-11 DIAGNOSIS — R482 Apraxia: Secondary | ICD-10-CM | POA: Insufficient documentation

## 2017-03-11 DIAGNOSIS — R278 Other lack of coordination: Secondary | ICD-10-CM

## 2017-03-11 NOTE — Therapy (Signed)
[facility] [address] [place], [name], [address] Phone: [phone]   Fax:  [phone]  Physical Therapy Treatment  Patient Details  Name: Katelyn Lamb MRN: [id number] Date of Birth: 14-Mar-1946 Referring Provider: Carlos Levering, PA-C  Encounter Date: 03/11/2017      PT End of Session - 03/11/17 1725    Visit Number 14  G4   Number of Visits 23   Date for PT Re-Evaluation 03/29/17   Authorization Type [employer]: G-CODE AND PROGRESS NOTE EVERY 10TH VISIT.    Authorization Time Period 02/04/17 to 04/05/17   PT Start Time 1448   PT Stop Time 1526   PT Time Calculation (min) 38 min   Equipment Utilized During Treatment Gait belt   Activity Tolerance Patient tolerated treatment well   Behavior During Therapy WFL for tasks assessed/performed      Past Medical History:  Diagnosis Date  . Anxiety   . Back pain   . Gait abnormality 11/14/2016  . Myelitis due to herpes simplex [facility])     Past Surgical History:  Procedure Laterality Date  . ABDOMINAL HYSTERECTOMY    . BLADDER REPAIR    . CESAREAN SECTION    . TUBAL LIGATION      There were no vitals filed for this visit.      Subjective Assessment - 03/11/17 1449    Subjective Doing well today. Feels her husband continues to try to do too much for her. "He won't even let me try to make a  sandwich"   Patient is accompained by: Family member   Pertinent History LLE DVT, anxiety, B cataract surgery in 2017   Patient Stated Goals Pt would like to be IND in everything (walk, feed dog, and perform ADLs safely).    Currently in Pain? Yes   Pain Score 5    Pain Location Back   Pain Orientation Mid   Pain Descriptors / Indicators Aching   Pain Type Chronic pain   Pain Onset More than a month ago   Pain Frequency Intermittent                         [facility] Adult PT Treatment/Exercise - 03/11/17 1452      Transfers   Sit to Stand 4:  Min guard;4: Min assist;Without upper extremity assist   Sit to Stand Details (indicate cue type and reason) on blue foam beam; losing balance posteriorly and needed frequent cues and assist to wt-shift anteriorly   Stand to Sit 4: Min guard   Number of Reps 10 reps     Ambulation/Gait   Ambulation/Gait Assistance 4: Min assist;4: Min guard   Ambulation/Gait Assistance Details initial 600 ft with staggering and catching Rt or Lt toe x 3; from 600-720 ft pt caught Rt toe x 4 with min assist to regain balance; pt with poor awareness and continued walking/talking throughout    Ambulation Distance (Feet) 720 Feet   Assistive device None   Gait Pattern Step-through pattern;Decreased arm swing - right;Decreased arm swing - left;Decreased step length - right;Decreased step length - left;Lateral hip instability;Decreased trunk rotation;Poor foot clearance - right;Narrow base of support;Poor foot clearance - left   Ambulation Surface Level;Indoor     Knee/Hip Exercises: Standing   Forward Step Up Both;2 sets;10 reps;Hand Hold: 2;Step Height: 6"   Other Standing Knee Exercises step tap to second step (12" high) alternating legs, no UE support x 20  Balance Exercises - 03/11/17 1721      Balance Exercises: Standing   Standing Eyes Opened Wide (BOA);Foam/compliant surface  blue mat, min assist due to posterior LOB; ultimately 30 sec   Other Standing Exercises standing on ramp on blue mat (downward facing minguard without difficulty); upward facing with min assist and posterior LOB repeatedly              PT Short Term Goals - 03/01/17 1634      PT SHORT TERM GOAL #1   Title Patient/caregiver will be independent with updated HEP to improve strength and balance. (TARGET date 03/01/17)   Time 4   Period Weeks   Status Achieved     PT SHORT TERM GOAL #2   Title Patient will improve Berg balance to >=21/56 to demonstrate improved balance and lesser risk of falls. (TARGET date  03/01/17)   Baseline 6/29 46/56   Time 4   Period Weeks   Status Achieved     PT SHORT TERM GOAL #3   Title Patient will ambulate 300 ft with least restrictive assistive device vs no device with supervision. (TARGET date 03/01/17)   Baseline 6/29 minguard due to imbalance   Time 4   Period Weeks   Status Partially Met           PT Long Term Goals - 03/11/17 1732      PT LONG TERM GOAL #1   Title Pt will improve gait speed to >/=2.56f/sec., no AD, to safely amb. in the community. TARGET DATE FOR ALL LTGS: 01/31/17   Baseline 6/4  2.21 ft/sec   Status Partially Met     PT LONG TERM GOAL #2   Title Pt will improve BERG score to >/=45/56 to decr. falls risk.    Baseline 6/4  14/56 (after hospitalized for medical decline)   Status Not Met     PT LONG TERM GOAL #3   Title Pt will amb. 600' over even/uneven terrain, at MOD I level, to progress to walking dog safely.    Baseline 6/4 requires min assist over level, indoor    Status Not Met     PT LONG TERM GOAL #4   Title Pt will verbalize understanding of fall prevention strategies to reduce falls risk.    Baseline 6/4 Husband can report education provided. He found no areas needing to be addressed at home. Pt unable to report on education.   Status Partially Met     PT LONG TERM GOAL #5   Title Patient/caregiver will be independent in updated HEP. (TARGET 03/29/17)   Time 8   Period Weeks   Status New     PT LONG TERM GOAL #6   Title Patient will improve gait velocity to >2.62 ft/sec while maintaining balance & proper use of DME (to indicate incr safety with community ambulation). (TARGET 03/29/17) Updated 7/9   Time 8   Period Weeks   Status New     PT LONG TERM GOAL #7   Title Patient will improve Berg score to >= 48/56 to indicate improved balance and lesser risk of falls. (TARGET 03/29/17) updated 7/9   Time 8   Period Weeks   Status New     PT LONG TERM GOAL #8   Title Patient will ambulate 600 ft over level and  uneven terrain with least restrictive device (vs no device) with supervision for safety. (TARGET 03/29/17)   Time 8   Period Weeks   Status New  Plan - 03/11/17 1727    Clinical Impression Statement Session focused on gait training with focus on longer distance and noted significant fatigue with catching rt foot and required min assist to prevent fall. Additional work on Hotel manager and strengthening LEs. Will continue to work towards [place].    Rehab Potential Good   Clinical Impairments Affecting Rehab Potential see above   PT Frequency 2x / week   PT Duration 8 weeks   PT Treatment/Interventions ADLs/Self Care Home Management;Electrical Stimulation;Biofeedback;Neuromuscular re-education;Balance training;Therapeutic exercise;Therapeutic activities;Manual techniques;Functional mobility training;Stair training;Gait training;DME Instruction;Orthotic Fit/Training;Patient/family education;Vestibular   PT Next Visit Plan core stability (?wall bumps on foam; ?tall kneeling), gait (?try RW or other DME); balance and LE strengthening   Consulted and Agree with Plan of Care Patient;Family member/caregiver   Family Member Consulted husband: [name]      Patient will benefit from skilled therapeutic intervention in order to improve the following deficits and impairments:  Abnormal gait, Pain, Decreased knowledge of use of DME, Decreased strength, Decreased mobility, Decreased balance, Decreased range of motion, Decreased coordination, Decreased safety awareness, Impaired flexibility, Impaired sensation  Visit Diagnosis: Other lack of coordination  Muscle weakness (generalized)  Unsteadiness on feet     Problem List Patient Active Problem List   Diagnosis Date Noted  . Medication monitoring encounter 01/08/2017  . Gait abnormality 11/14/2016  . Hypotension due to drugs   . Urinary retention   . Anxiety about health   . Reactive depression   . Ataxia   . Abdominal  spasms   . Constipation due to pain medication   . Acute lower UTI   . Dysuria   . Acute deep vein thrombosis (DVT) of popliteal vein of left lower extremity ([place])   . Incomplete paraplegia ([place])   . Acute blood loss anemia   . Neurogenic bladder   . Neuropathic pain   . Muscle spasm   . Gastroesophageal reflux disease   . Slow transit constipation   . Thrombocytopenia ([place]) 10/03/2016  . Abnormal MRI, spinal cord   . Encephalomyelitis   . Numbness   . Intractable back pain 09/20/2016  . Numbness of left lower extremity 09/20/2016  . Hyponatremia 09/20/2016  . Herpes zoster without complication 84/69/6295    Rexanne Mano, PT 03/11/2017, 5:36 PM  [facility] [address] [place], [name], [address] Phone: [phone]   Fax:  [phone]  Name: Katelyn Lamb MRN: [id number] Date of Birth: Oct 14, 1945

## 2017-03-12 DIAGNOSIS — G959 Disease of spinal cord, unspecified: Secondary | ICD-10-CM | POA: Diagnosis not present

## 2017-03-12 DIAGNOSIS — M5416 Radiculopathy, lumbar region: Secondary | ICD-10-CM | POA: Diagnosis not present

## 2017-03-12 DIAGNOSIS — M5136 Other intervertebral disc degeneration, lumbar region: Secondary | ICD-10-CM | POA: Diagnosis not present

## 2017-03-12 DIAGNOSIS — G894 Chronic pain syndrome: Secondary | ICD-10-CM | POA: Diagnosis not present

## 2017-03-12 DIAGNOSIS — G0490 Encephalitis and encephalomyelitis, unspecified: Secondary | ICD-10-CM | POA: Diagnosis not present

## 2017-03-12 NOTE — Therapy (Signed)
[facility] [address] [place], [name], [address] Phone: [phone]   Fax:  [phone]  Occupational Therapy Re- Evaluation  Patient Details  Name: Katelyn Lamb MRN: [id number] Date of Birth: [birth year] Referring Provider: Carlos Levering PA-C  Encounter Date: 03/11/2017      OT End of Session - 03/12/17 1258    Visit Number 9   Number of Visits 24   Authorization Type [employer] - G code required   Authorization - Visit Number 9  G code completed visit 9   Authorization - Number of Visits 19   OT Start Time 1407   OT Stop Time 1445   OT Time Calculation (min) 38 min   Activity Tolerance Patient tolerated treatment well   Behavior During Therapy WFL for tasks assessed/performed      Past Medical History:  Diagnosis Date  . Anxiety   . Back pain   . Gait abnormality 11/14/2016  . Myelitis due to herpes simplex [facility])     Past Surgical History:  Procedure Laterality Date  . ABDOMINAL HYSTERECTOMY    . BLADDER REPAIR    . CESAREAN SECTION    . TUBAL LIGATION      There were no vitals filed for this visit.      Subjective Assessment - 03/11/17 1409    Currently in Pain? Yes   Pain Score 5    Pain Location Back   Pain Orientation Mid   Pain Descriptors / Indicators Aching   Pain Type Chronic pain   Pain Onset More than a month ago   Pain Frequency Intermittent   Aggravating Factors  moving   Pain Relieving Factors lying down   Multiple Pain Sites Yes   Pain Score 7   Pain Location Hand   Pain Orientation Right;Left   Pain Descriptors / Indicators Burning   Pain Type Chronic pain   Pain Onset More than a month ago   Pain Frequency Constant   Aggravating Factors  movments   Pain Relieving Factors heat           [facility] OT Assessment - 03/12/17 0001      Assessment   Diagnosis HSV II encephalomyelitis  however diagnosis remains uncertain     Balance Screen   Has the  patient fallen in the past 6 months No     Home  Environment   Lives With Spouse     ADL   Eating/Feeding Needs assist with cutting food   Grooming --  Needs assist with styling hair   Upper Body Bathing Set up   Lower Body Bathing Minimal assistance   Upper Body Dressing Minimal assistance  needs A with buttons   Lower Body Dressing Minimal assistance   Toilet Transfer Supervision/safety;Min guard  minguard- supervision   Toileting -  Hygiene Moderate assistance  hygeine following a BM   Tub/Shower Transfer Min guard     IADL   Light Housekeeping Does not participate in any housekeeping tasks   Meal Prep Needs to have meals prepared and served   Medication Management --  Husband assists   Financial Management Dependent     Written Expression   Handwriting 50% legible  for name     Cognition   Overall Cognitive Status Impaired/Different from baseline   Memory Impaired   Memory Impairment Decreased short term memory     Sensation   Light Touch Impaired by gross assessment     Hand Function  Right Hand Grip (lbs) 17  lbs   Left Hand Grip (lbs) 8 lbs                           OT Short Term Goals - 03/12/17 1325      OT SHORT TERM GOAL #1   Title Pt and husband will be mod I with home activities program - 01/05/17   Status Achieved     OT SHORT TERM GOAL #2   Title Pt to feed self 75% of the time AE prn   Status Achieved     OT SHORT TERM GOAL #3   Title Pt will require min a for dressing    Status Achieved  per pt and husband pt only needs assist with buttons/tying     OT SHORT TERM GOAL #4   Title Pt will complete sit to stand with close superivison and vc's for safety as prep for more independent functional mobility   Status Achieved     OT SHORT TERM GOAL #5   Title Improve bilateral UE function as evidenced by performing Box & Blocks with at least an 8 block improvement   Baseline eval: Rt = 17, Lt = 10    Status Not Met  r= 23, L  = 12     Additional Short Term Goals   Additional Short Term Goals Yes     OT SHORT TERM GOAL #6   Title Pt will be demonstrate ability for simple activity in standing without UE support with minimal dynamic standing balance with close supervision.     Status Achieved     OT SHORT TERM GOAL #7   Title Pt will demonstrate ability for sustained attention with no more than min vc's during functional familiar basic activity   Status Achieved     OT SHORT TERM GOAL #8   Title Pt will perform bathing and dressing with supervison only   Time 4   Period Weeks   Status New     OT SHORT TERM GOAL  #9   TITLE Pt will perfom snack/ sandwich prep with supervision and min v.c.   Time 4   Period Weeks   Status New     OT SHORT TERM GOAL  #10   TITLE Pt will demonstrate improved bilateral UE functional use as evidenced by increasing box/ blocks score by 5  blocks   Baseline RUE 29 blocks, LUE 11 blocks   Time 4   Period Weeks   Status New     OT SHORT TERM GOAL  #11   TITLE Pt will increase bilateral grip strength by 5 lbs for increased ease with ADLs/IADLs.   Baseline RUE 17 lbs, LUE 8 lbs   Time 4   Period Weeks   Status New           OT Long Term Goals - 03/12/17 1327      OT LONG TERM GOAL #1   Title Pt and husband Independent with updated HEP    Time 8   Period Weeks   Status On-going     OT LONG TERM GOAL #2   Title Pt will demonstrate ability to zip and button with vc's only   Time 8   Period Weeks   Status On-going     OT LONG TERM GOAL #3   Title Pt to perform simple snack/sandwich prep S level ( now set as STG)   Baseline now  set as STG   Time --   Period Weeks   Status Not Met     OT LONG TERM GOAL #4   Title Pt to demo sufficient UE strength as demo by folding laundry for 10 minutes w/o rest in standing   Time 8   Period Weeks   Status On-going     OT LONG TERM GOAL #5   Title Pt to improve grip strength by 10 lbs or greater from initial eval    Status Not Met     Long Term Additional Goals   Additional Long Term Goals Yes     OT LONG TERM GOAL #6   Title Pt will require no more than supervision for dressing    Status Not Met     OT LONG TERM GOAL #7   Title Pt will be supervision for toilet transfers and hygeine   Time 8   Period Weeks   Status On-going     OT LONG TERM GOAL #8   Title Pt will be supervision for shower transfers   Time 8   Status On-going     OT LONG TERM GOAL  #9   Baseline Pt will demonstrate ability for sustained/selective attention for familar functional task with no more than 2 vc's.    Time 8   Period Weeks   Status On-going     OT LONG TERM GOAL  #10   TITLE Pt will perform simple sandwich prep/ basic home management at a modified independent level.   Time 8   Period Weeks   Status New               Plan - 03/12/17 1313    Clinical Impression Statement Patient returns to clinic after hospitalization at [facility] 5/14-5/25/18 . Patient admitted due to decline in sensation and underwent plasmapheresis x 5 doses and solumedrol. Diagnosis remains uncertain with test result for ?paraneoplasm still pending.Patient requested to place OT on hold after d/c home as she did not feel she could tolerate oth OT/ PT at the same time. With the decline in status, will extend her OT for 8 weeks. Pt presents with decreased coordination, decreased sensation, pain, decreased strength, decreased functional mobility, and cognitive deficits which impede her performance of ADLs/IADLs   Occupational Profile and client history currently impacting functional performance Pt's deficits impedes her ability to perform ADLs and IADLS   Occupational performance deficits (Please refer to evaluation for details): ADL's;IADL's;Rest and Sleep;Leisure;Social Participation   Current Impairments/barriers affecting progress: apraxia, cognitive deficits, ataxia, severe sensory impairment from chest level down.    OT Frequency 2x /  week   OT Duration 8 weeks   OT Treatment/Interventions Self-care/ADL training;Moist Heat;Fluidtherapy;DME and/or AE instruction;Patient/family education;Splinting;Therapeutic activities;Neuromuscular education;Functional Mobility Training;Passive range of motion;Cognitive remediation/compensation;Visual/perceptual remediation/compensation;Manual Therapy;Therapeutic exercise;Balance training   Plan work towards updated goals, coordination activities, ADLs   Consulted and Agree with Plan of Care Patient;Family member/caregiver   Family Member Consulted husband      Patient will benefit from skilled therapeutic intervention in order to improve the following deficits and impairments:  Abnormal gait, Decreased activity tolerance, Decreased balance, Decreased cognition, Decreased coordination, Decreased safety awareness, Decreased mobility, Decreased strength, Difficulty walking, Impaired UE functional use, Impaired tone, Impaired sensation, Impaired vision/preception, Pain  Visit Diagnosis: Other lack of coordination - Plan: Ot plan of care cert/re-cert  Muscle weakness (generalized) - Plan: Ot plan of care cert/re-cert  Unsteadiness on feet - Plan: Ot plan of care cert/re-cert  Other abnormalities  of gait and mobility - Plan: Ot plan of care cert/re-cert  Other symptoms and signs involving the nervous system - Plan: Ot plan of care cert/re-cert  Other disturbances of skin sensation - Plan: Ot plan of care cert/re-cert  Frontal lobe and executive function deficit - Plan: Ot plan of care cert/re-cert      G-Codes - 56/81/27 1350    Functional Assessment Tool Used (Outpatient only)  Box & Blocks Rt = 29 blocks, Lt = 11   Functional Limitation Carrying, moving and handling objects   Carrying, Moving and Handling Objects Current Status (N1700) At least 60 percent but less than 80 percent impaired, limited or restricted   Carrying, Moving and Handling Objects Goal Status (F7494) At least 40  percent but less than 60 percent impaired, limited or restricted      Problem List Patient Active Problem List   Diagnosis Date Noted  . Medication monitoring encounter 01/08/2017  . Gait abnormality 11/14/2016  . Hypotension due to drugs   . Urinary retention   . Anxiety about health   . Reactive depression   . Ataxia   . Abdominal spasms   . Constipation due to pain medication   . Acute lower UTI   . Dysuria   . Acute deep vein thrombosis (DVT) of popliteal vein of left lower extremity ([place])   . Incomplete paraplegia ([place])   . Acute blood loss anemia   . Neurogenic bladder   . Neuropathic pain   . Muscle spasm   . Gastroesophageal reflux disease   . Slow transit constipation   . Thrombocytopenia ([place]) 10/03/2016  . Abnormal MRI, spinal cord   . Encephalomyelitis   . Numbness   . Intractable back pain 09/20/2016  . Numbness of left lower extremity 09/20/2016  . Hyponatremia 09/20/2016  . Herpes zoster without complication 49/67/5916    RINE,KATHRYN 03/12/2017, 1:57 PM  [facility] [address] [place], [name], [address] Phone: [phone]   Fax:  [phone]  Name: Katelyn Lamb MRN: [id number] Date of Birth: March 12, 1946

## 2017-03-13 ENCOUNTER — Ambulatory Visit: Payer: PPO

## 2017-03-13 DIAGNOSIS — R2689 Other abnormalities of gait and mobility: Secondary | ICD-10-CM

## 2017-03-13 DIAGNOSIS — M6281 Muscle weakness (generalized): Secondary | ICD-10-CM

## 2017-03-13 DIAGNOSIS — R2681 Unsteadiness on feet: Secondary | ICD-10-CM

## 2017-03-13 DIAGNOSIS — R278 Other lack of coordination: Secondary | ICD-10-CM | POA: Diagnosis not present

## 2017-03-14 ENCOUNTER — Encounter: Payer: Self-pay | Admitting: Occupational Therapy

## 2017-03-15 NOTE — Therapy (Signed)
[facility] [address] [place], [name], [address] Phone: [phone]   Fax:  [phone]  Physical Therapy Treatment  Patient Details  Name: Katelyn Lamb MRN: [id number] Date of Birth: [birth year] Referring Provider: Carlos Levering, PA-C  Encounter Date: 03/13/2017      PT End of Session - 03/15/17 0749    Visit Number 15  G5 (carried over from 03/13/17 as note could not be closed that day)   Number of Visits 23   Date for PT Re-Evaluation 03/29/17   Authorization Type [employer]: G-CODE AND PROGRESS NOTE EVERY 10TH VISIT.    Authorization Time Period 02/04/17 to 04/05/17   PT Start Time 1449   PT Stop Time 1532   PT Time Calculation (min) 43 min   Equipment Utilized During Treatment --  min guard to min A prn   Activity Tolerance Patient tolerated treatment well   Behavior During Therapy WFL for tasks assessed/performed      Past Medical History:  Diagnosis Date  . Anxiety   . Back pain   . Gait abnormality 11/14/2016  . 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 ABDOMINAL HYSTERECTOMY    . BLADDER REPAIR    . CESAREAN SECTION    . TUBAL LIGATION      There were no vitals filed for this visit.      Subjective Assessment - 03/15/17 0748    Subjective Pt denied falls since last visit. Pt reported she had a head/neck MRI with contrast yesterday but does not have the results. Pt reported R hand sensation was "almost" normal today.    Patient is accompained by: Family member  husband: [name]   Pertinent History LLE DVT, anxiety, B cataract surgery in 2017   Patient Stated Goals Pt would like to be IND in everything (walk, feed dog, and perform ADLs safely).    Currently in Pain? No/denies          03/13/17 1534  Ambulation/Gait  Ambulation/Gait Yes  Ambulation/Gait Assistance 4: Min guard;4: Min assist  Ambulation/Gait Assistance Details  Pt performed pre-gait activities to improve reciprocal arm swing and B heel strike (R>L). Pt amb. before and after pre-gait with cues to improve focus, upright posture, heel strike, and arm swing. Pt required less cues and performing just one task at a time (heel strike vs. arm swing).  Pt refused to try AD.  Ambulation Distance (Feet) 100 Feet (30x4, 100')  Assistive device None  Gait Pattern Step-through pattern;Decreased arm swing - right;Decreased arm swing - left;Decreased step length - right;Decreased step length - left;Lateral hip instability;Decreased trunk rotation;Poor foot clearance - right;Narrow base of support;Decreased dorsiflexion - right  Ambulation Surface Level;Indoor  Exercises  Exercises Knee/Hip  Knee/Hip Exercises: Standing  Wall Squat 5 reps;5 seconds;3 sets  Wall Squat Limitations Pt with lateral wt. shift to the L side, even with 2" block placed under foot. Pt attempted tall kneeling squats x3 reps but reported B knee pain and had to cease tall kneeling activities.         03/13/17 1501  Balance Exercises: Standing  Wall Bumps Hip  Wall Bumps-Hips Eyes opened;Anterior/posterior;Foam/compliant surface;20 reps (and solid surface)                           PT Short Term Goals - 03/01/17 1634      PT SHORT TERM  GOAL #1   Title Patient/caregiver will be independent with updated HEP to improve strength and balance. (TARGET date 03/01/17)   Time 4   Period Weeks   Status Achieved     PT SHORT TERM GOAL #2   Title Patient will improve Berg balance to >=21/56 to demonstrate improved balance and lesser risk of falls. (TARGET date 03/01/17)   Baseline 6/29 46/56   Time 4   Period Weeks   Status Achieved     PT SHORT TERM GOAL #3   Title Patient will ambulate 300 ft with least restrictive assistive device vs no device with supervision. (TARGET date 03/01/17)   Baseline 6/29 minguard due to imbalance   Time 4   Period Weeks   Status Partially  Met           PT Long Term Goals - 03/11/17 1732      PT LONG TERM GOAL #1   Title Pt will improve gait speed to >/=2.70f/sec., no AD, to safely amb. in the community. TARGET DATE FOR ALL LTGS: 01/31/17   Baseline 6/4  2.21 ft/sec   Status Partially Met     PT LONG TERM GOAL #2   Title Pt will improve BERG score to >/=45/56 to decr. falls risk.    Baseline 6/4  14/56 (after hospitalized for medical decline)   Status Not Met     PT LONG TERM GOAL #3   Title Pt will amb. 600' over even/uneven terrain, at MOD I level, to progress to walking dog safely.    Baseline 6/4 requires min assist over level, indoor    Status Not Met     PT LONG TERM GOAL #4   Title Pt will verbalize understanding of fall prevention strategies to reduce falls risk.    Baseline 6/4 Husband can report education provided. He found no areas needing to be addressed at home. Pt unable to report on education.   Status Partially Met     PT LONG TERM GOAL #5   Title Patient/caregiver will be independent in updated HEP. (TARGET 03/29/17)   Time 8   Period Weeks   Status New     PT LONG TERM GOAL #6   Title Patient will improve gait velocity to >2.62 ft/sec while maintaining balance & proper use of DME (to indicate incr safety with community ambulation). (TARGET 03/29/17) Updated 7/9   Time 8   Period Weeks   Status New     PT LONG TERM GOAL #7   Title Patient will improve Berg score to >= 48/56 to indicate improved balance and lesser risk of falls. (TARGET 03/29/17) updated 7/9   Time 8   Period Weeks   Status New     PT LONG TERM GOAL #8   Title Patient will ambulate 600 ft over level and uneven terrain with least restrictive device (vs no device) with supervision for safety. (TARGET 03/29/17)   Time 8   Period Weeks   Status New               Plan - 03/15/17 0749    Clinical Impression Statement Carried over from 03/13/17 as note could not be closed that day. Pt required incr. time during all  activities to allow for processing and 2/2 knee pain (during tall kneeling). Pt required cues during wall squats to improve weight shifting to the RLE vs. L LE bias. Pt demonstrated improve heel strike after pre-gait activities, but was unable to perform heel strike with  reciprocal arm swing 2/2 cognition. Pt would continue to benefit from skilled PT to improve safety during functional mobility.    Rehab Potential Good   Clinical Impairments Affecting Rehab Potential see above   PT Frequency 2x / week   PT Duration 8 weeks   PT Treatment/Interventions ADLs/Self Care Home Management;Electrical Stimulation;Biofeedback;Neuromuscular re-education;Balance training;Therapeutic exercise;Therapeutic activities;Manual techniques;Functional mobility training;Stair training;Gait training;DME Instruction;Orthotic Fit/Training;Patient/family education;Vestibular   PT Next Visit Plan  Reciprocal arm swing and heel strike during gait (?try RW or other DME-pt refused); balance and LE strengthening   Consulted and Agree with Plan of Care Patient;Family member/caregiver   Family Member Consulted husband: [name]      Patient will benefit from skilled therapeutic intervention in order to improve the following deficits and impairments:  Abnormal gait, Pain, Decreased knowledge of use of DME, Decreased strength, Decreased mobility, Decreased balance, Decreased range of motion, Decreased coordination, Decreased safety awareness, Impaired flexibility, Impaired sensation  Visit Diagnosis: Other abnormalities of gait and mobility  Muscle weakness (generalized)  Unsteadiness on feet     Problem List Patient Active Problem List   Diagnosis Date Noted  . Medication monitoring encounter 01/08/2017  . Gait abnormality 11/14/2016  . Hypotension due to drugs   . Urinary retention   . Anxiety about health   . Reactive depression   . Ataxia   . Abdominal spasms   . Constipation due to pain medication   . Acute lower  UTI   . Dysuria   . Acute deep vein thrombosis (DVT) of popliteal vein of left lower extremity ([place])   . Incomplete paraplegia ([place])   . Acute blood loss anemia   . Neurogenic bladder   . Neuropathic pain   . Muscle spasm   . Gastroesophageal reflux disease   . Slow transit constipation   . Thrombocytopenia ([place]) 10/03/2016  . Abnormal MRI, spinal cord   . Encephalomyelitis   . Numbness   . Intractable back pain 09/20/2016  . Numbness of left lower extremity 09/20/2016  . Hyponatremia 09/20/2016  . Herpes zoster without complication 85/46/2703    Delitha Elms L 03/15/2017, 7:51 AM  [facility] [address] [place], [name], [address] Phone: [phone]   Fax:  [phone]  Name: SAMADHI MAHURIN MRN: [id number] Date of Birth: 30-Nov-1945  Geoffry Paradise, PT,DPT 03/15/17 7:52 AM Phone: [phone] Fax: [phone]

## 2017-03-18 ENCOUNTER — Encounter: Payer: PPO | Admitting: Occupational Therapy

## 2017-03-18 ENCOUNTER — Ambulatory Visit: Payer: PPO | Admitting: Physical Therapy

## 2017-03-19 DIAGNOSIS — M816 Localized osteoporosis [Lequesne]: Secondary | ICD-10-CM | POA: Diagnosis not present

## 2017-03-19 DIAGNOSIS — Z6822 Body mass index (BMI) 22.0-22.9, adult: Secondary | ICD-10-CM | POA: Diagnosis not present

## 2017-03-19 DIAGNOSIS — N958 Other specified menopausal and perimenopausal disorders: Secondary | ICD-10-CM | POA: Diagnosis not present

## 2017-03-19 DIAGNOSIS — N819 Female genital prolapse, unspecified: Secondary | ICD-10-CM | POA: Diagnosis not present

## 2017-03-19 DIAGNOSIS — Z01419 Encounter for gynecological examination (general) (routine) without abnormal findings: Secondary | ICD-10-CM | POA: Diagnosis not present

## 2017-03-19 DIAGNOSIS — Z1231 Encounter for screening mammogram for malignant neoplasm of breast: Secondary | ICD-10-CM | POA: Diagnosis not present

## 2017-03-21 ENCOUNTER — Other Ambulatory Visit: Payer: Self-pay | Admitting: Obstetrics and Gynecology

## 2017-03-21 ENCOUNTER — Ambulatory Visit: Payer: PPO | Admitting: Physical Therapy

## 2017-03-21 ENCOUNTER — Ambulatory Visit: Payer: PPO | Admitting: Occupational Therapy

## 2017-03-21 DIAGNOSIS — R2681 Unsteadiness on feet: Secondary | ICD-10-CM

## 2017-03-21 DIAGNOSIS — R278 Other lack of coordination: Secondary | ICD-10-CM

## 2017-03-21 DIAGNOSIS — R2689 Other abnormalities of gait and mobility: Secondary | ICD-10-CM

## 2017-03-21 DIAGNOSIS — R928 Other abnormal and inconclusive findings on diagnostic imaging of breast: Secondary | ICD-10-CM

## 2017-03-21 DIAGNOSIS — R208 Other disturbances of skin sensation: Secondary | ICD-10-CM

## 2017-03-21 DIAGNOSIS — R29818 Other symptoms and signs involving the nervous system: Secondary | ICD-10-CM

## 2017-03-21 NOTE — Patient Instructions (Signed)
  LOOK AT HAND, GO SLOW, PAUSE BETWEEN EACH REPETITION, OPEN HAND FULLY BEFORE GRASPING  1. FLIP CARDS OVER RT HAND, THEN LT HAND (MAY NEED LARGE CARDS FOR LT HAND)   2. PRACTICE PICKING UP AND RELEASING CUPS WITH RT HAND, THEN LT HAND. PROGRESS TO PUTTING ON SHELF WITHOUT KNOCKING OVER SURROUNDING THINGS  3) PRACTICE PICKING UP COINS AND PUTTING IN NARROW CONTAINER OR CUP WITH EACH HAND.   4) PRACTICE USING LT HAND FOR FINGER FOODS  5) USE TRAVEL CUPS WITH LIDS WHEN TRANSPORTING DRINK  6) PRACTICE USING HAND FOR SAFE FUNCTIONAL ACTIVITIES: HANGING CLOTHES, PICKING UP REMOTE, OPENING CABINETS, PUTTING AWAY LIGHT GROCERIES, FOLDING    LAUNDRY  DO NOT USE EITHER HAND TO HOLD ANYTHING HEAVY, SHARP, HOT, OR BREAKABLE

## 2017-03-21 NOTE — Therapy (Signed)
[facility] [address] [place], [name], [address] Phone: [phone]   Fax:  [phone]  Occupational Therapy Treatment  Patient Details  Name: Katelyn Lamb MRN: [id number] Date of Birth: 18-Jan-1946 Referring Provider: Carlos Levering PA-C  Encounter Date: 03/21/2017      OT End of Session - 03/21/17 1701    Visit Number 10   Number of Visits 24   Date for OT Re-Evaluation 05/11/17   Authorization Type [employer] - G code required   Authorization - Visit Number 10   Authorization - Number of Visits 19   OT Start Time 1450   OT Stop Time 1530   OT Time Calculation (min) 40 min   Activity Tolerance Patient tolerated treatment well      Past Medical History:  Diagnosis Date  . Anxiety   . Back pain   . Gait abnormality 11/14/2016  . 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 ABDOMINAL HYSTERECTOMY    . BLADDER REPAIR    . CESAREAN SECTION    . TUBAL LIGATION      There were no vitals filed for this visit.      Subjective Assessment - 03/21/17 1509    Subjective  They still don't know what is really going on   Patient is accompained by: Family member  husband   Pertinent History HSV2 encephalitis and myelitis, anxiety,    Limitations no driving, fall risk   Patient Stated Goals to be normal and get stronger   Currently in Pain? No/denies                      OT Treatments/Exercises (OP) - 03/21/17 0001      ADLs   UB Dressing Discussed UE dressing - pt reports donning shirt is easier than doffing shirt. Pt to bring in shirt to practice next session   LB Dressing Attempted tying shoes - pt able to make first initial knot with max difficulty but unable to do bow part   ADL Comments Cues given throughout functional coordination to look at hands when performing task, go slow, open hand fully. Discussed safety concerns  especially with Lt hand.      Fine Motor Coordination   Other Fine Motor Exercises Pt issued functional coordination activities for HEP - see pt instructions. Pt demo each                OT Education - 03/21/17 1528    Education provided Yes   Education Details functional coordination activities    Person(s) Educated Patient;Spouse   Methods Explanation;Demonstration;Handout   Comprehension Verbalized understanding;Returned demonstration          OT Short Term Goals - 03/21/17 1702      OT SHORT TERM GOAL #1   Title Pt and husband will be mod I with home activities program - 01/05/17   Status Achieved     OT SHORT TERM GOAL #2   Title Pt to feed self 75% of the time AE prn   Status Achieved     OT SHORT TERM GOAL #3   Title Pt will require min a for dressing    Status Achieved  per pt and husband pt only needs assist with buttons/tying     OT SHORT TERM GOAL #4   Title Pt will complete sit to stand with close superivison and vc's for safety  as prep for more independent functional mobility   Status Achieved     OT SHORT TERM GOAL #5   Title Improve bilateral UE function as evidenced by performing Box & Blocks with at least an 8 block improvement   Baseline eval: Rt = 17, Lt = 10    Status Not Met  r= 23, L = 12     OT SHORT TERM GOAL #6   Title Pt will be demonstrate ability for simple activity in standing without UE support with minimal dynamic standing balance with close supervision.     Status Achieved     OT SHORT TERM GOAL #7   Title Pt will demonstrate ability for sustained attention with no more than min vc's during functional familiar basic activity   Status Achieved     OT SHORT TERM GOAL #8   Title Pt will perform bathing and dressing with supervison only - all new STG's below due 04/11/17   Time 4   Period Weeks   Status New     OT SHORT TERM GOAL  #9   TITLE Pt will perfom snack/ sandwich prep with supervision and min v.c.   Time 4   Period  Weeks   Status New     OT SHORT TERM GOAL  #10   TITLE Pt will demonstrate improved bilateral UE functional use as evidenced by increasing box/ blocks score by 5  blocks   Baseline RUE 29 blocks, LUE 11 blocks   Time 4   Period Weeks   Status New     OT SHORT TERM GOAL  #11   TITLE Pt will increase bilateral grip strength by 5 lbs for increased ease with ADLs/IADLs.   Baseline RUE 17 lbs, LUE 8 lbs   Time 4   Period Weeks   Status New           OT Long Term Goals - 03/21/17 1703      OT LONG TERM GOAL #1   Title Pt and husband Independent with updated HEP - all ongoing and new LTG's due 05/11/17   Time 8   Period Weeks   Status On-going     OT LONG TERM GOAL #2   Title Pt will demonstrate ability to zip and button with vc's only   Time 8   Period Weeks   Status On-going     OT LONG TERM GOAL #3   Title Pt to perform simple snack/sandwich prep S level ( now set as STG)   Baseline now set as STG   Period Weeks   Status Not Met     OT LONG TERM GOAL #4   Title Pt to demo sufficient UE strength as demo by folding laundry for 10 minutes w/o rest in standing   Time 8   Period Weeks   Status On-going     OT LONG TERM GOAL #5   Title Pt to improve grip strength by 10 lbs or greater from initial eval   Status Not Met     OT LONG TERM GOAL #6   Title Pt will require no more than supervision for dressing    Status Not Met     OT LONG TERM GOAL #7   Title Pt will be supervision for toilet transfers and hygeine   Time 8   Period Weeks   Status On-going     OT LONG TERM GOAL #8   Title Pt will be supervision for shower transfers  Time 8   Status On-going     OT LONG TERM GOAL  #9   Baseline Pt will demonstrate ability for sustained/selective attention for familar functional task with no more than 2 vc's.    Time 8   Period Weeks   Status On-going     OT LONG TERM GOAL  #10   TITLE Pt will perform simple sandwich prep/ basic home management at a modified  independent level.   Time 8   Period Weeks   Status New               Plan - 03/21/17 1704    Clinical Impression Statement Pt continues to demo decreased sensation, coordination, ataxia, and apraxia worse LUE than RUE. Pt's cognitive deficits also impede ADLS   Rehab Potential Fair   Current Impairments/barriers affecting progress: apraxia, cognitive deficits, ataxia, severe sensory impairment from chest level down.    OT Frequency 2x / week   OT Duration 8 weeks   OT Treatment/Interventions Self-care/ADL training;Moist Heat;Fluidtherapy;DME and/or AE instruction;Patient/family education;Splinting;Therapeutic activities;Neuromuscular education;Functional Mobility Training;Passive range of motion;Cognitive remediation/compensation;Visual/perceptual remediation/compensation;Manual Therapy;Therapeutic exercise;Balance training   Plan practice UE dressing and buttons (pt to bring in shirts)   Consulted and Agree with Plan of Care Patient;Family member/caregiver   Family Member Consulted husband      Patient will benefit from skilled therapeutic intervention in order to improve the following deficits and impairments:  Abnormal gait, Decreased activity tolerance, Decreased balance, Decreased cognition, Decreased coordination, Decreased safety awareness, Decreased mobility, Decreased strength, Difficulty walking, Impaired UE functional use, Impaired tone, Impaired sensation, Impaired vision/preception, Pain  Visit Diagnosis: Other lack of coordination  Other symptoms and signs involving the nervous system  Other disturbances of skin sensation    Problem List Patient Active Problem List   Diagnosis Date Noted  . Medication monitoring encounter 01/08/2017  . Gait abnormality 11/14/2016  . Hypotension due to drugs   . Urinary retention   . Anxiety about health   . Reactive depression   . Ataxia   . Abdominal spasms   . Constipation due to pain medication   . Acute lower UTI    . Dysuria   . Acute deep vein thrombosis (DVT) of popliteal vein of left lower extremity ([place])   . Incomplete paraplegia ([place])   . Acute blood loss anemia   . Neurogenic bladder   . Neuropathic pain   . Muscle spasm   . Gastroesophageal reflux disease   . Slow transit constipation   . Thrombocytopenia ([place]) 10/03/2016  . Abnormal MRI, spinal cord   . Encephalomyelitis   . Numbness   . Intractable back pain 09/20/2016  . Numbness of left lower extremity 09/20/2016  . Hyponatremia 09/20/2016  . Herpes zoster without complication 27/74/1287    Carey Bullocks, OTR/L 03/21/2017, 5:07 PM  [facility] [address] [place], [name], [address] Phone: [phone]   Fax:  [phone]  Name: Katelyn Lamb MRN: [id number] Date of Birth: 1945/10/27

## 2017-03-22 ENCOUNTER — Encounter: Payer: PPO | Admitting: Occupational Therapy

## 2017-03-22 NOTE — Therapy (Signed)
[facility] [address] [place], [name], [address] Phone: [phone]   Fax:  [phone]  Physical Therapy Treatment  Patient Details  Name: Katelyn Lamb MRN: [id number] Date of Birth: [birth year] Referring Provider: Carlos Levering, PA-C  Encounter Date: 03/21/2017      PT End of Session - 03/22/17 1347    Visit Number 16  G6   Number of Visits 23   Date for PT Re-Evaluation 03/29/17   Authorization Type [employer]: G-CODE AND PROGRESS NOTE EVERY 10TH VISIT.    Authorization Time Period 02/04/17 to 04/05/17   PT Start Time 1401   PT Stop Time 1445   PT Time Calculation (min) 44 min   Equipment Utilized During Treatment Gait belt      Past Medical History:  Diagnosis Date  . Anxiety   . Back pain   . Gait abnormality 11/14/2016  . Myelitis due to herpes simplex [facility])     Past Surgical History:  Procedure Laterality Date  . ABDOMINAL HYSTERECTOMY    . BLADDER REPAIR    . CESAREAN SECTION    . TUBAL LIGATION      There were no vitals filed for this visit.      Subjective Assessment - 03/22/17 1337    Subjective Pt states the MRI results showed no change in the lesions which was good news; pt does report having had 2 falls within past 2 days but states she did not get hurt   Patient is accompained by: Family member   Pertinent History LLE DVT, anxiety, B cataract surgery in 2017   Patient Stated Goals Pt would like to be IND in everything (walk, feed dog, and perform ADLs safely).    Currently in Pain? No/denies                         [facility] Adult PT Treatment/Exercise - 03/22/17 0001      Transfers   Transfers Sit to Stand   Sit to Stand 5: Supervision   Sit to Stand Details (indicate cue type and reason) slight unsteadiness noted    Number of Reps Other reps (comment)  3 reps   Comments no UE support used     Ambulation/Gait   Ambulation/Gait Yes   Ambulation/Gait  Assistance 4: Min guard;4: Min assist   Ambulation/Gait Assistance Details no device used; pt states she has been trying to increase her arm swing   Ambulation Distance (Feet) 350 Feet   Assistive device None   Gait Pattern Step-through pattern;Decreased arm swing - right;Decreased arm swing - left;Decreased step length - right;Decreased step length - left;Lateral hip instability;Decreased trunk rotation;Poor foot clearance - right;Narrow base of support;Decreased dorsiflexion - right   Ambulation Surface Level;Indoor   Stairs Assistance 4: Min guard   Stair Management Technique One rail Right;Alternating pattern;Step to pattern  alternating with ascension:  step to with descension   Number of Stairs 4   Height of Stairs 6     Knee/Hip Exercises: Standing   Heel Raises Both;1 set;10 reps   Other Standing Knee Exercises Ankle sways x 10 reps with minimal UE support iwth CGA             Balance Exercises - 03/22/17 1345      Balance Exercises: Standing   Rockerboard Anterior/posterior;10 reps;EO   Other Standing Exercises Pt performed amb. tossing ball 30' x 4 reps with CGA for multi-tasking with gait; pt had  some difficulty catching ball due to decreased sensation and coordination in hands.  pt performed kicking ball gently with each foot 30' x 2 reps with CGA to min assist for recovery of LOB - activity performed on tiled surface     Pt also performed SLS activity with use of white ladder on floor - stepped forward in each "block" step over step sequence For improved SLS:  Then performed stepping out to side alternating with stepping inside ladder for improved lateral stepping  And weight shifting - CGA to min assist for balance recovery Cone taps with each foot, then tipping cone over and standing upright with min assist        PT Short Term Goals - 03/01/17 1634      PT SHORT TERM GOAL #1   Title Patient/caregiver will be independent with updated HEP to improve strength  and balance. (TARGET date 03/01/17)   Time 4   Period Weeks   Status Achieved     PT SHORT TERM GOAL #2   Title Patient will improve Berg balance to >=21/56 to demonstrate improved balance and lesser risk of falls. (TARGET date 03/01/17)   Baseline 6/29 46/56   Time 4   Period Weeks   Status Achieved     PT SHORT TERM GOAL #3   Title Patient will ambulate 300 ft with least restrictive assistive device vs no device with supervision. (TARGET date 03/01/17)   Baseline 6/29 minguard due to imbalance   Time 4   Period Weeks   Status Partially Met           PT Long Term Goals - 03/11/17 1732      PT LONG TERM GOAL #1   Title Pt will improve gait speed to >/=2.6f/sec., no AD, to safely amb. in the community. TARGET DATE FOR ALL LTGS: 01/31/17   Baseline 6/4  2.21 ft/sec   Status Partially Met     PT LONG TERM GOAL #2   Title Pt will improve BERG score to >/=45/56 to decr. falls risk.    Baseline 6/4  14/56 (after hospitalized for medical decline)   Status Not Met     PT LONG TERM GOAL #3   Title Pt will amb. 600' over even/uneven terrain, at MOD I level, to progress to walking dog safely.    Baseline 6/4 requires min assist over level, indoor    Status Not Met     PT LONG TERM GOAL #4   Title Pt will verbalize understanding of fall prevention strategies to reduce falls risk.    Baseline 6/4 Husband can report education provided. He found no areas needing to be addressed at home. Pt unable to report on education.   Status Partially Met     PT LONG TERM GOAL #5   Title Patient/caregiver will be independent in updated HEP. (TARGET 03/29/17)   Time 8   Period Weeks   Status New     PT LONG TERM GOAL #6   Title Patient will improve gait velocity to >2.62 ft/sec while maintaining balance & proper use of DME (to indicate incr safety with community ambulation). (TARGET 03/29/17) Updated 7/9   Time 8   Period Weeks   Status New     PT LONG TERM GOAL #7   Title Patient will  improve Berg score to >= 48/56 to indicate improved balance and lesser risk of falls. (TARGET 03/29/17) updated 7/9   Time 8   Period Weeks   Status New  PT LONG TERM GOAL #8   Title Patient will ambulate 600 ft over level and uneven terrain with least restrictive device (vs no device) with supervision for safety. (TARGET 03/29/17)   Time 8   Period Weeks   Status New               Plan - 03/22/17 1348    Clinical Impression Statement Pt continues to exhibit decreased SLS but declines to use assistive device.  Overall gait pattern is improving with increased initial heel contact in stance and incr. arm swing.     Rehab Potential Good   Clinical Impairments Affecting Rehab Potential see above   PT Frequency 2x / week   PT Duration 8 weeks   PT Treatment/Interventions ADLs/Self Care Home Management;Electrical Stimulation;Biofeedback;Neuromuscular re-education;Balance training;Therapeutic exercise;Therapeutic activities;Manual techniques;Functional mobility training;Stair training;Gait training;DME Instruction;Orthotic Fit/Training;Patient/family education;Vestibular   PT Next Visit Plan Check LTG's next week (renew if pt continues) as end of certification period is 03-29-17 -- need to determine if pt is to continue PT or D/C (pt is scheduled out until early Sept. 2018)   Consulted and Agree with Plan of Care Patient;Family member/caregiver   Family Member Consulted husband: [name]      Patient will benefit from skilled therapeutic intervention in order to improve the following deficits and impairments:  Abnormal gait, Pain, Decreased knowledge of use of DME, Decreased strength, Decreased mobility, Decreased balance, Decreased range of motion, Decreased coordination, Decreased safety awareness, Impaired flexibility, Impaired sensation  Visit Diagnosis: Other abnormalities of gait and mobility  Unsteadiness on feet     Problem List Patient Active Problem List   Diagnosis Date  Noted  . Medication monitoring encounter 01/08/2017  . Gait abnormality 11/14/2016  . Hypotension due to drugs   . Urinary retention   . Anxiety about health   . Reactive depression   . Ataxia   . Abdominal spasms   . Constipation due to pain medication   . Acute lower UTI   . Dysuria   . Acute deep vein thrombosis (DVT) of popliteal vein of left lower extremity ([place])   . Incomplete paraplegia ([place])   . Acute blood loss anemia   . Neurogenic bladder   . Neuropathic pain   . Muscle spasm   . Gastroesophageal reflux disease   . Slow transit constipation   . Thrombocytopenia ([place]) 10/03/2016  . Abnormal MRI, spinal cord   . Encephalomyelitis   . Numbness   . Intractable back pain 09/20/2016  . Numbness of left lower extremity 09/20/2016  . Hyponatremia 09/20/2016  . Herpes zoster without complication 90/05/2003    [place], PT 03/22/2017, 1:55 PM  [facility] [address] [place], [name], [address] Phone: [phone]   Fax:  [phone]  Name: Katelyn Lamb MRN: [id number] Date of Birth: September 20, 1945

## 2017-03-26 ENCOUNTER — Ambulatory Visit: Payer: PPO | Admitting: Occupational Therapy

## 2017-03-26 ENCOUNTER — Ambulatory Visit: Payer: PPO

## 2017-03-26 DIAGNOSIS — R41844 Frontal lobe and executive function deficit: Secondary | ICD-10-CM

## 2017-03-26 DIAGNOSIS — R2689 Other abnormalities of gait and mobility: Secondary | ICD-10-CM

## 2017-03-26 DIAGNOSIS — R278 Other lack of coordination: Secondary | ICD-10-CM

## 2017-03-26 DIAGNOSIS — R482 Apraxia: Secondary | ICD-10-CM

## 2017-03-26 DIAGNOSIS — R208 Other disturbances of skin sensation: Secondary | ICD-10-CM

## 2017-03-26 DIAGNOSIS — R2681 Unsteadiness on feet: Secondary | ICD-10-CM

## 2017-03-26 DIAGNOSIS — M6281 Muscle weakness (generalized): Secondary | ICD-10-CM

## 2017-03-26 DIAGNOSIS — R29818 Other symptoms and signs involving the nervous system: Secondary | ICD-10-CM

## 2017-03-26 NOTE — Therapy (Signed)
[name], [name], [address] Phone: [phone]   Fax:  [phone]  Occupational Therapy Treatment  Patient Details  Name: Katelyn Lamb MRN: [id number] Date of Birth: [birth year] Referring Provider: Carlos Levering PA-C  Encounter Date: 03/26/2017      OT End of Session - 03/26/17 1611    Visit Number 11   Number of Visits 24   Date for OT Re-Evaluation 05/11/17   Authorization Type [employer] - G code required   Authorization - Visit Number 11   Authorization - Number of Visits 19   OT Start Time 1408   OT Stop Time 1450   OT Time Calculation (min) 42 min   Activity Tolerance Patient tolerated treatment well   Behavior During Therapy [facility] for tasks assessed/performed      Past Medical History:  Diagnosis Date  . Anxiety   . Back pain   . Gait abnormality 11/14/2016  . Myelitis due to herpes simplex [facility])     Past Surgical History:  Procedure Laterality Date  . ABDOMINAL HYSTERECTOMY    . BLADDER REPAIR    . CESAREAN SECTION    . TUBAL LIGATION      There were no vitals filed for this visit.      Subjective Assessment - 03/26/17 1412    Subjective  Husband reports that she woke up this morning and couldn't move LUE   Patient is accompained by: Family member  husband   Pertinent History HSV2 encephalitis and myelitis, anxiety,    Limitations no driving, fall risk   Patient Stated Goals to be normal and get stronger   Currently in Pain? Yes   Pain Score 2    Pain Location Back   Pain Descriptors / Indicators Aching  back   Pain Type Chronic pain   Pain Frequency Intermittent   Aggravating Factors  inactivity, too much activity   Pain Relieving Factors pain pill, heat      Pt attempted to don sweater, but demo significant difficulty putting in 2nd UE due to sensation deficits.  Therefore, recommended pt holding sweater with inside forward and placing R hand in  sleeve hole and then don like a cape (swing around).  Pt demo improvement using this strategy.  Adjusted in standing in front of mirror with supervision for balance.  Attempting buttoning large buttons on sweater while wearing, but demo significant difficulty.  Therefore, practiced buttoning in lap with improvement/success initially, but pt demo incr difficultly with internal/external distractions after first 2-3 buttons.  Pt cued to make sure that she looks at her hands/objects to use vision to compensate for sensation deficits.  Donning pull-over shirt with incr time/difficulty and min cueing.  Pt able to adjust/pull down in front of mirror with min-mod cueing to use vision and use one hand at a time (layered).  However, pt needed min-mod A to doff (but layered) due to tightness of shirt and wearing shirt underneath today and cueing given for use of vision (turning head to look at hand).  Attempted several strategies, but pt with continued difficulty due to decr sensation.  Recommended that pt attempt at home (without layer) and at least attempt to perform part of activity.  Pt will need reinforcement of strategies and repetition due to cognitive deficits, apraxia.                        OT Education - 03/26/17 1610  Education Details adaptive strategies for dressing   Person(s) Educated Patient;Spouse   Methods Explanation;Demonstration;Verbal cues;Tactile cues   Comprehension Verbalized understanding;Returned demonstration;Verbal cues required;Tactile cues required;Need further instruction          OT Short Term Goals - 03/21/17 1702      OT SHORT TERM GOAL #1   Title Pt and husband will be mod I with home activities program - 01/05/17   Status Achieved     OT SHORT TERM GOAL #2   Title Pt to feed self 75% of the time AE prn   Status Achieved     OT SHORT TERM GOAL #3   Title Pt will require min a for dressing    Status Achieved  per pt and husband pt only needs  assist with buttons/tying     OT SHORT TERM GOAL #4   Title Pt will complete sit to stand with close superivison and vc's for safety as prep for more independent functional mobility   Status Achieved     OT SHORT TERM GOAL #5   Title Improve bilateral UE function as evidenced by performing Box & Blocks with at least an 8 block improvement   Baseline eval: Rt = 17, Lt = 10    Status Not Met  r= 23, L = 12     OT SHORT TERM GOAL #6   Title Pt will be demonstrate ability for simple activity in standing without UE support with minimal dynamic standing balance with close supervision.     Status Achieved     OT SHORT TERM GOAL #7   Title Pt will demonstrate ability for sustained attention with no more than min vc's during functional familiar basic activity   Status Achieved     OT SHORT TERM GOAL #8   Title Pt will perform bathing and dressing with supervison only - all new STG's below due 04/11/17   Time 4   Period Weeks   Status New     OT SHORT TERM GOAL  #9   TITLE Pt will perfom snack/ sandwich prep with supervision and min v.c.   Time 4   Period Weeks   Status New     OT SHORT TERM GOAL  #10   TITLE Pt will demonstrate improved bilateral UE functional use as evidenced by increasing box/ blocks score by 5  blocks   Baseline RUE 29 blocks, LUE 11 blocks   Time 4   Period Weeks   Status New     OT SHORT TERM GOAL  #11   TITLE Pt will increase bilateral grip strength by 5 lbs for increased ease with ADLs/IADLs.   Baseline RUE 17 lbs, LUE 8 lbs   Time 4   Period Weeks   Status New           OT Long Term Goals - 03/21/17 1703      OT LONG TERM GOAL #1   Title Pt and husband Independent with updated HEP - all ongoing and new LTG's due 05/11/17   Time 8   Period Weeks   Status On-going     OT LONG TERM GOAL #2   Title Pt will demonstrate ability to zip and button with vc's only   Time 8   Period Weeks   Status On-going     OT LONG TERM GOAL #3   Title Pt to  perform simple snack/sandwich prep S level ( now set as STG)   Baseline now set as STG  Period Weeks   Status Not Met     OT LONG TERM GOAL #4   Title Pt to demo sufficient UE strength as demo by folding laundry for 10 minutes w/o rest in standing   Time 8   Period Weeks   Status On-going     OT LONG TERM GOAL #5   Title Pt to improve grip strength by 10 lbs or greater from initial eval   Status Not Met     OT LONG TERM GOAL #6   Title Pt will require no more than supervision for dressing    Status Not Met     OT LONG TERM GOAL #7   Title Pt will be supervision for toilet transfers and hygeine   Time 8   Period Weeks   Status On-going     OT LONG TERM GOAL #8   Title Pt will be supervision for shower transfers   Time 8   Status On-going     OT LONG TERM GOAL  #9   Baseline Pt will demonstrate ability for sustained/selective attention for familar functional task with no more than 2 vc's.    Time 8   Period Weeks   Status On-going     OT LONG TERM GOAL  #10   TITLE Pt will perform simple sandwich prep/ basic home management at a modified independent level.   Time 8   Period Weeks   Status New               Plan - 03/26/17 1612    Clinical Impression Statement UB dressing limited due to decr sensation, decr coordination, apraxia, and cognitive deficits.  Pt with improvement in donning sweater with use of strategies.    Rehab Potential Fair   Current Impairments/barriers affecting progress: apraxia, cognitive deficits, ataxia, severe sensory impairment from chest level down.    OT Frequency 2x / week   OT Duration 8 weeks   OT Treatment/Interventions Self-care/ADL training;Moist Heat;Fluidtherapy;DME and/or AE instruction;Patient/family education;Splinting;Therapeutic activities;Neuromuscular education;Functional Mobility Training;Passive range of motion;Cognitive remediation/compensation;Visual/perceptual remediation/compensation;Manual Therapy;Therapeutic  exercise;Balance training   Plan HEP for shoulders (? supine ball ex), continue with ADL strategies    Consulted and Agree with Plan of Care Patient;Family member/caregiver   Family Member Consulted husband      Patient will benefit from skilled therapeutic intervention in order to improve the following deficits and impairments:  Abnormal gait, Decreased activity tolerance, Decreased balance, Decreased cognition, Decreased coordination, Decreased safety awareness, Decreased mobility, Decreased strength, Difficulty walking, Impaired UE functional use, Impaired tone, Impaired sensation, Impaired vision/preception, Pain  Visit Diagnosis: Other symptoms and signs involving the nervous system  Other disturbances of skin sensation  Other lack of coordination  Unsteadiness on feet  Frontal lobe and executive function deficit  Muscle weakness (generalized)  Apraxia    Problem List Patient Active Problem List   Diagnosis Date Noted  . Medication monitoring encounter 01/08/2017  . Gait abnormality 11/14/2016  . Hypotension due to drugs   . Urinary retention   . Anxiety about health   . Reactive depression   . Ataxia   . Abdominal spasms   . Constipation due to pain medication   . Acute lower UTI   . Dysuria   . Acute deep vein thrombosis (DVT) of popliteal vein of left lower extremity ([place])   . Incomplete paraplegia ([place])   . Acute blood loss anemia   . Neurogenic bladder   . Neuropathic pain   . Muscle spasm   .  Gastroesophageal reflux disease   . Slow transit constipation   . Thrombocytopenia ([place]) 10/03/2016  . Abnormal MRI, spinal cord   . Encephalomyelitis   . Numbness   . Intractable back pain 09/20/2016  . Numbness of left lower extremity 09/20/2016  . Hyponatremia 09/20/2016  . Herpes zoster without complication 40/33/5331    [facility] 03/26/2017, 4:17 PM  [facility] [address] [place], [name], [address] Phone: [phone]   Fax:  [phone]  Name: Katelyn Lamb MRN: [id number] Date of Birth: [birth year]   Vianne Bulls, OTR/L [facility] [address] [place], [place]  [address] [phone] phone [phone] 03/26/17 4:17 PM

## 2017-03-26 NOTE — Therapy (Signed)
[facility] [address] [place], [name], [address] Phone: [phone]   Fax:  [phone]  Physical Therapy Treatment  Patient Details  Name: Katelyn Lamb MRN: [id number] Date of Birth: [birth year] Referring Provider: Carlos Levering, PA-C  Encounter Date: 03/26/2017      PT End of Session - 03/26/17 1631    Visit Number 17   Number of Visits 23   Date for PT Re-Evaluation 03/29/17   Authorization Type [employer]: G-CODE AND PROGRESS NOTE EVERY 10TH VISIT.    Authorization Time Period 02/04/17 to 04/05/17   PT Start Time 1450   PT Stop Time 1532   PT Time Calculation (min) 42 min   Equipment Utilized During Treatment Gait belt   Activity Tolerance Patient limited by pain;Patient limited by fatigue   Behavior During Therapy [facility] for tasks assessed/performed      Past Medical History:  Diagnosis Date  . Anxiety   . Back pain   . Gait abnormality 11/14/2016  . Myelitis due to herpes simplex [facility])     Past Surgical History:  Procedure Laterality Date  . ABDOMINAL HYSTERECTOMY    . BLADDER REPAIR    . CESAREAN SECTION    . TUBAL LIGATION      There were no vitals filed for this visit.      Subjective Assessment - 03/26/17 1453    Subjective Pt denied falls or changes since last visit, except for dog running over pt's face. Pt reported scratches but no major injuries.    Patient is accompained by: Family member   Pertinent History LLE DVT, anxiety, B cataract surgery in 2017   Patient Stated Goals Pt would like to be IND in everything (walk, feed dog, and perform ADLs safely).    Currently in Pain? No/denies                         [facility] Adult PT Treatment/Exercise - 03/26/17 1456      Ambulation/Gait   Ambulation/Gait Yes   Ambulation/Gait Assistance 4: Min guard;4: Min assist   Ambulation/Gait Assistance Details Cues to improve heel strike, decr. shuffle gait, improve stride  length and to stay in straight trajectory. Pt required seated rest break 2/2 7/10 LBP after amb. Cues for weight shifting over inclines/declines and to improve posterior trunk lean.    Ambulation Distance (Feet) 600 Feet  50'   Assistive device None   Gait Pattern Step-through pattern;Decreased arm swing - right;Decreased arm swing - left;Decreased step length - right;Decreased step length - left;Lateral hip instability;Decreased trunk rotation;Poor foot clearance - right;Narrow base of support;Decreased dorsiflexion - right;Shuffle   Ambulation Surface Level;Unlevel;Indoor;Outdoor   Gait velocity 1.62 ft/sec. no AD     Standardized Balance Assessment   Standardized Balance Assessment Berg Balance Test     Berg Balance Test   Sit to Stand Able to stand without using hands and stabilize independently   Standing Unsupported Able to stand 30 seconds unsupported  LOB after 1 minute and 30 seconds   Sitting with Back Unsupported but Feet Supported on Floor or Stool Able to sit safely and securely 2 minutes   Stand to Sit Sits safely with minimal use of hands   Transfers Able to transfer safely, minor use of hands   Standing Unsupported with Eyes Closed Able to stand 10 seconds with supervision   Standing Ubsupported with Feet Together Needs help to attain position but able to stand for  30 seconds with feet together   From Standing, Reach Forward with Outstretched Arm Can reach forward >12 cm safely (5")  6" with RUE   From Standing Position, Pick up Object from West to pick up shoe safely and easily   From Standing Position, Turn to Look Behind Over each Shoulder Turn sideways only but maintains balance   Turn 360 Degrees Able to turn 360 degrees safely but slowly   Standing Unsupported, Alternately Place Feet on Step/Stool Able to stand independently and safely and complete 8 steps in 20 seconds   Standing Unsupported, One Foot in Front Able to take small step independently and hold 30  seconds   Standing on One Leg Tries to lift leg/unable to hold 3 seconds but remains standing independently   Total Score 40                PT Education - 03/26/17 1630    Education provided Yes   Education Details PT discussed goal progress and the importance of performing HEP at home to improve deficits. PT educated pt on renewal for 2x/week for 2 weeks to focus on HEP and then d/c.    Person(s) Educated Patient;Spouse   Methods Explanation   Comprehension Verbalized understanding;Need further instruction          PT Short Term Goals - 03/01/17 1634      PT SHORT TERM GOAL #1   Title Patient/caregiver will be independent with updated HEP to improve strength and balance. (TARGET date 03/01/17)   Time 4   Period Weeks   Status Achieved     PT SHORT TERM GOAL #2   Title Patient will improve Berg balance to >=21/56 to demonstrate improved balance and lesser risk of falls. (TARGET date 03/01/17)   Baseline 6/29 46/56   Time 4   Period Weeks   Status Achieved     PT SHORT TERM GOAL #3   Title Patient will ambulate 300 ft with least restrictive assistive device vs no device with supervision. (TARGET date 03/01/17)   Baseline 6/29 minguard due to imbalance   Time 4   Period Weeks   Status Partially Met           PT Long Term Goals - 03/26/17 1635      PT LONG TERM GOAL #1   Title Pt will improve gait speed to >/=2.47f/sec., no AD, to safely amb. in the community. TARGET DATE FOR ALL LTGS: 01/31/17   Baseline 6/4  2.21 ft/sec   Status Partially Met     PT LONG TERM GOAL #2   Title Pt will improve BERG score to >/=45/56 to decr. falls risk.    Baseline 6/4  14/56 (after hospitalized for medical decline)   Status Not Met     PT LONG TERM GOAL #3   Title Pt will amb. 600' over even/uneven terrain, at MOD I level, to progress to walking dog safely.    Baseline 6/4 requires min assist over level, indoor    Status Not Met     PT LONG TERM GOAL #4   Title Pt will  verbalize understanding of fall prevention strategies to reduce falls risk.    Baseline 6/4 Husband can report education provided. He found no areas needing to be addressed at home. Pt unable to report on education.   Status Partially Met     PT LONG TERM GOAL #5   Title Patient/caregiver will be independent in updated HEP. (TARGET 03/29/17)  Baseline ALL UNMET GOALS WILL BE CARRIED OVER TO NEW POC: 04/12/17   Time 8   Period Weeks   Status New     PT LONG TERM GOAL #6   Title Patient will improve gait velocity to >2.62 ft/sec while maintaining balance & proper use of DME (to indicate incr safety with community ambulation). (TARGET 03/29/17) Updated 7/9   Time 8   Period Weeks   Status Not Met     PT LONG TERM GOAL #7   Title Patient will improve Berg score to >= 48/56 to indicate improved balance and lesser risk of falls. (TARGET 03/29/17) updated 7/9   Time 8   Period Weeks   Status Not Met     PT LONG TERM GOAL #8   Title Patient will ambulate 600 ft over level and uneven terrain with least restrictive device (vs no device) with supervision for safety. (TARGET 03/29/17)   Time 8   Period Weeks   Status Not Met               Plan - 03/26/17 1632    Clinical Impression Statement Pt did not meet LTGs 6, 7, or 8. PT will assess LTG 5 next session and renew pt for 2x/week for 2 weeks in order to focus on HEP. Pt had a difficult time today during balance/gait activities 2/2 7/10 LBP. Pt required incr. time during all activities 2/2 LPB, and required seated rest breaks. Pt also reported she's not been performing HEP often, therefore, PT reiterated the importance of performing HEP. PT will renew for 2x/week for 2 weeks and then d/c pt, with the intent of focusing on compensation strategies and HEP in the next 2 weeks to improve safety during functional mobility.    Rehab Potential Good   Clinical Impairments Affecting Rehab Potential see above   PT Frequency 2x / week   PT Duration  2 weeks  original POC: 2x/week for 8 weeks   PT Treatment/Interventions ADLs/Self Care Home Management;Electrical Stimulation;Biofeedback;Neuromuscular re-education;Balance training;Therapeutic exercise;Therapeutic activities;Manual techniques;Functional mobility training;Stair training;Gait training;DME Instruction;Orthotic Fit/Training;Patient/family education;Vestibular   PT Next Visit Plan Check HEP LTG, cancel PT appt's after 04/12/17.  Pt prefers exercises in supine and seated positions.    Consulted and Agree with Plan of Care Patient;Family member/caregiver   Family Member Consulted husband: [name]      Patient will benefit from skilled therapeutic intervention in order to improve the following deficits and impairments:  Abnormal gait, Pain, Decreased knowledge of use of DME, Decreased strength, Decreased mobility, Decreased balance, Decreased range of motion, Decreased coordination, Decreased safety awareness, Impaired flexibility, Impaired sensation  Visit Diagnosis: Other abnormalities of gait and mobility - Plan: PT plan of care cert/re-cert  Unsteadiness on feet - Plan: PT plan of care cert/re-cert  Muscle weakness (generalized) - Plan: PT plan of care cert/re-cert  Other lack of coordination - Plan: PT plan of care cert/re-cert  Other disturbances of skin sensation - Plan: PT plan of care cert/re-cert     Problem List Patient Active Problem List   Diagnosis Date Noted  . Medication monitoring encounter 01/08/2017  . Gait abnormality 11/14/2016  . Hypotension due to drugs   . Urinary retention   . Anxiety about health   . Reactive depression   . Ataxia   . Abdominal spasms   . Constipation due to pain medication   . Acute lower UTI   . Dysuria   . Acute deep vein thrombosis (DVT) of popliteal vein of left  lower extremity ([place])   . Incomplete paraplegia ([place])   . Acute blood loss anemia   . Neurogenic bladder   . Neuropathic pain   . Muscle spasm   .  Gastroesophageal reflux disease   . Slow transit constipation   . Thrombocytopenia ([place]) 10/03/2016  . Abnormal MRI, spinal cord   . Encephalomyelitis   . Numbness   . Intractable back pain 09/20/2016  . Numbness of left lower extremity 09/20/2016  . Hyponatremia 09/20/2016  . Herpes zoster without complication 03/52/4818    Akeelah Seppala L 03/26/2017, 4:41 PM  [facility] [address] [place], [name], [address] Phone: [phone]   Fax:  [phone]  Name: Katelyn Lamb MRN: [id number] Date of Birth: Jun 23, 1946  Geoffry Paradise, PT,DPT 03/26/17 4:42 PM Phone: [phone] Fax: [phone]

## 2017-03-29 ENCOUNTER — Encounter: Payer: PPO | Admitting: Occupational Therapy

## 2017-03-29 ENCOUNTER — Ambulatory Visit: Payer: PPO

## 2017-03-29 ENCOUNTER — Ambulatory Visit
Admission: RE | Admit: 2017-03-29 | Discharge: 2017-03-29 | Disposition: A | Discharge status: Home / Self Care | Payer: PPO | Source: Ambulatory Visit | Attending: Obstetrics and Gynecology | Admitting: Obstetrics and Gynecology

## 2017-03-29 DIAGNOSIS — R2681 Unsteadiness on feet: Secondary | ICD-10-CM

## 2017-03-29 DIAGNOSIS — R928 Other abnormal and inconclusive findings on diagnostic imaging of breast: Secondary | ICD-10-CM

## 2017-03-29 DIAGNOSIS — M6281 Muscle weakness (generalized): Secondary | ICD-10-CM

## 2017-03-29 DIAGNOSIS — R278 Other lack of coordination: Secondary | ICD-10-CM

## 2017-03-29 NOTE — Therapy (Signed)
[facility] [address] [place], [name], [address] Phone: [phone]   Fax:  [phone]  Physical Therapy Treatment  Patient Details  Name: Katelyn Lamb MRN: [id number] Date of Birth: Dec 06, 1945 Referring Provider: Carlos Levering, PA-C  Encounter Date: 03/29/2017      PT End of Session - 03/29/17 1543    Visit Number 18   Number of Visits 23   Date for PT Re-Evaluation 04/12/17   Authorization Type [employer]: G-CODE AND PROGRESS NOTE EVERY 10TH VISIT.    PT Start Time 1446   PT Stop Time 1535   PT Time Calculation (min) 49 min   Activity Tolerance Patient tolerated treatment well   Behavior During Therapy WFL for tasks assessed/performed      Past Medical History:  Diagnosis Date  . Anxiety   . Back pain   . Gait abnormality 11/14/2016  . Myelitis due to herpes simplex [facility])     Past Surgical History:  Procedure Laterality Date  . ABDOMINAL HYSTERECTOMY    . BLADDER REPAIR    . CESAREAN SECTION    . TUBAL LIGATION      There were no vitals filed for this visit.      Subjective Assessment - 03/29/17 1451    Subjective Pt reported mammogram results were negative, so pt states she's having a good day mentally. Pt reported balance is still off today. Pt denied falls since last visit. At end of session, husband voiced concern over only 2 weeks of PT left. PT explained that pt needs to see improved functional gains to justify continued PT, and that the next 2 weeks would focus on compensatory strategies and HEP.    Patient is accompained by: Family member   Pertinent History LLE DVT, anxiety, B cataract surgery in 2017   Patient Stated Goals Pt would like to be IND in everything (walk, feed dog, and perform ADLs safely).    Currently in Pain? No/denies        Neuro re-ed: Pt performed with min guard: feet apart/together head turns x5/direction. Cues and demo for technique. Please see pt  instructions for HEP details.  Therex: Pt performed in seated/supine positions, pt denied pain during therex. Pt required cues and demo for technique. Please see pt instructions for HEP details.                          PT Education - 03/29/17 1542    Education provided Yes   Education Details PT modified HEP to more supine/seated activities per pt request, as she stated she'd be more willing to perform HEP in seated/supine for safety. Pt not currently performing HEP. PT reiterated the importance of performing HEP in order to improve functional abilities. PT again discussed d/c in 2 weeks, and that next 2 weeks would focus on modifying HEP to improve balance, strength, and flexibility.    Person(s) Educated Patient;Spouse   Methods Explanation;Demonstration;Verbal cues;Handout   Comprehension Returned demonstration;Verbalized understanding;Need further instruction          PT Short Term Goals - 03/01/17 1634      PT SHORT TERM GOAL #1   Title Patient/caregiver will be independent with updated HEP to improve strength and balance. (TARGET date 03/01/17)   Time 4   Period Weeks   Status Achieved     PT SHORT TERM GOAL #2   Title Patient will improve Berg balance to >=21/56 to demonstrate improved  balance and lesser risk of falls. (TARGET date 03/01/17)   Baseline 6/29 46/56   Time 4   Period Weeks   Status Achieved     PT SHORT TERM GOAL #3   Title Patient will ambulate 300 ft with least restrictive assistive device vs no device with supervision. (TARGET date 03/01/17)   Baseline 6/29 minguard due to imbalance   Time 4   Period Weeks   Status Partially Met           PT Long Term Goals - 03/29/17 1546      PT LONG TERM GOAL #1   Title Pt will improve gait speed to >/=2.22f/sec., no AD, to safely amb. in the community. TARGET DATE FOR ALL LTGS: 01/31/17   Baseline 6/4  2.21 ft/sec   Status Partially Met     PT LONG TERM GOAL #2   Title Pt will improve  BERG score to >/=45/56 to decr. falls risk.    Baseline 6/4  14/56 (after hospitalized for medical decline)   Status Not Met     PT LONG TERM GOAL #3   Title Pt will amb. 600' over even/uneven terrain, at MOD I level, to progress to walking dog safely.    Baseline 6/4 requires min assist over level, indoor    Status Not Met     PT LONG TERM GOAL #4   Title Pt will verbalize understanding of fall prevention strategies to reduce falls risk.    Baseline 6/4 Husband can report education provided. He found no areas needing to be addressed at home. Pt unable to report on education.   Status Partially Met     PT LONG TERM GOAL #5   Title Patient/caregiver will be independent in updated HEP. (TARGET 03/29/17)   Baseline ALL UNMET GOALS WILL BE CARRIED OVER TO NEW POC: 04/12/17   Time 8   Period Weeks   Status Not Met     PT LONG TERM GOAL #6   Title Patient will improve gait velocity to >2.62 ft/sec while maintaining balance & proper use of DME (to indicate incr safety with community ambulation). (TARGET 03/29/17) Updated 7/9   Time 8   Period Weeks   Status Not Met     PT LONG TERM GOAL #7   Title Patient will improve Berg score to >= 48/56 to indicate improved balance and lesser risk of falls. (TARGET 03/29/17) updated 7/9   Time 8   Period Weeks   Status Not Met     PT LONG TERM GOAL #8   Title Patient will ambulate 600 ft over level and uneven terrain with least restrictive device (vs no device) with supervision for safety. (TARGET 03/29/17)   Time 8   Period Weeks   Status Not Met               Plan - 03/29/17 1544    Clinical Impression Statement Pt did not meet LTG 5, as she reported she's not performing HEP at home. Therefore, session focused on modifying HEP in order to encourage pt to perform at home to improve gains. PT again reiterated the importance of performing HEP, and that next two weeks of PT will focus on compensatory strategies and HEP and to improve strength,  balance, and flexibility. Continue with POC.    Rehab Potential Good   Clinical Impairments Affecting Rehab Potential see above   PT Frequency 2x / week   PT Duration 2 weeks  original POC: 2x/week for 8  weeks   PT Treatment/Interventions ADLs/Self Care Home Management;Electrical Stimulation;Biofeedback;Neuromuscular re-education;Balance training;Therapeutic exercise;Therapeutic activities;Manual techniques;Functional mobility training;Stair training;Gait training;DME Instruction;Orthotic Fit/Training;Patient/family education;Vestibular   PT Next Visit Plan Review HEP as needed and focus on other alternatives for walking (pt states she won't use RW 2/2 poor hand grip).    Consulted and Agree with Plan of Care Patient;Family member/caregiver   Family Member Consulted husband: [name]      Patient will benefit from skilled therapeutic intervention in order to improve the following deficits and impairments:  Abnormal gait, Pain, Decreased knowledge of use of DME, Decreased strength, Decreased mobility, Decreased balance, Decreased range of motion, Decreased coordination, Decreased safety awareness, Impaired flexibility, Impaired sensation  Visit Diagnosis: Muscle weakness (generalized)  Unsteadiness on feet  Other lack of coordination     Problem List Patient Active Problem List   Diagnosis Date Noted  . Medication monitoring encounter 01/08/2017  . Gait abnormality 11/14/2016  . Hypotension due to drugs   . Urinary retention   . Anxiety about health   . Reactive depression   . Ataxia   . Abdominal spasms   . Constipation due to pain medication   . Acute lower UTI   . Dysuria   . Acute deep vein thrombosis (DVT) of popliteal vein of left lower extremity ([place])   . Incomplete paraplegia ([place])   . Acute blood loss anemia   . Neurogenic bladder   . Neuropathic pain   . Muscle spasm   . Gastroesophageal reflux disease   . Slow transit constipation   . Thrombocytopenia ([place])  10/03/2016  . Abnormal MRI, spinal cord   . Encephalomyelitis   . Numbness   . Intractable back pain 09/20/2016  . Numbness of left lower extremity 09/20/2016  . Hyponatremia 09/20/2016  . Herpes zoster without complication 24/82/5003    Jazzlyn Huizenga L 03/29/2017, 3:47 PM  [facility] [address] [place], [name], [address] Phone: [phone]   Fax:  [phone]  Name: Katelyn Lamb MRN: [id number] Date of Birth: November 20, 1945  Geoffry Paradise, PT,DPT 03/29/17 3:50 PM Phone: [phone] Fax: [phone]

## 2017-03-29 NOTE — Patient Instructions (Addendum)
Piriformis Stretch, Sitting:      Sit, one ankle on opposite knee, same-side hand on crossed knee. Push down on knee, keeping spine straight. Lean torso forward, with flat back, until tension is felt in hamstrings and gluteals of crossed-leg side. Hold __30_ seconds.  Repeat with other leg.  Repeat _3__ times per session. Do _1-2__ sessions per day.  Copyright  VHI. All rights reserved.   HIP: Hamstrings - Short Sitting       Rest leg on raised surface or on floor. Keep knee straight. Lift chest. Hold __30_ seconds. Repeat on other side.  _3__ reps per set, __1-2_ sets per day, _7__ days per week  Copyright  VHI. All rights reserved.   Bridge    Lie back, legs bent. Tuck in stomach and then lift hips up towards ceiling. Slowly come back down.  Repeat __5__ times. Do __2__ sets per session, perform 3 days a week.  [email or web address].[email or web address]   Copyright  VHI. All rights reserved.   HIP / KNEE: Flexion, Knee to Chest - Supine    Tuck in stomach. Raise knee to chest, keep both knees bent.Perform slowly. __5_ reps per set, _3__ sets per session, _3__ days per week Place red band above knees.  Copyright  VHI. All rights reserved.   ABDUCTION: Sitting (Active)    Sit with feet flat, tie red band above knees. Lift right leg slightly and draw it out to side. Repeat with left leg.  Complete __2_ sets of _10__ repetitions. Perform __3_ sessions per week.  Copyright  VHI. All rights reserved.     Functional Quadriceps: Sit to Stand:     Sit on edge of chair, feet flat on floor. Stand upright, extending knees fully. Repeat __10__ times per set. Do __1__ sets per session. Do __1-2__ sessions per day.          Perform in a corner with a chair in front of you for safety OR at kitchen sink with chair behind you:.  Feet Apart, Head Motion - Eyes Open    With eyes open, feet apart, move head slowly: up and down 5 times and side to side 5 times. Repeat __3__ times per  session. Do __1__ sessions per day.  Copyright  VHI. All rights reserved.

## 2017-04-01 ENCOUNTER — Ambulatory Visit: Payer: PPO | Admitting: Physical Therapy

## 2017-04-02 ENCOUNTER — Ambulatory Visit: Payer: PPO | Admitting: Physical Therapy

## 2017-04-02 ENCOUNTER — Ambulatory Visit: Payer: PPO | Admitting: Occupational Therapy

## 2017-04-04 ENCOUNTER — Ambulatory Visit: Payer: PPO | Admitting: Occupational Therapy

## 2017-04-04 ENCOUNTER — Telehealth: Payer: Self-pay | Admitting: *Deleted

## 2017-04-04 ENCOUNTER — Ambulatory Visit: Payer: PPO | Attending: Family Medicine | Admitting: Physical Therapy

## 2017-04-04 DIAGNOSIS — M6281 Muscle weakness (generalized): Secondary | ICD-10-CM

## 2017-04-04 DIAGNOSIS — R208 Other disturbances of skin sensation: Secondary | ICD-10-CM | POA: Insufficient documentation

## 2017-04-04 DIAGNOSIS — R29818 Other symptoms and signs involving the nervous system: Secondary | ICD-10-CM | POA: Insufficient documentation

## 2017-04-04 DIAGNOSIS — R278 Other lack of coordination: Secondary | ICD-10-CM | POA: Diagnosis not present

## 2017-04-04 DIAGNOSIS — R2689 Other abnormalities of gait and mobility: Secondary | ICD-10-CM | POA: Diagnosis not present

## 2017-04-04 DIAGNOSIS — R2681 Unsteadiness on feet: Secondary | ICD-10-CM | POA: Insufficient documentation

## 2017-04-04 NOTE — Therapy (Signed)
[facility] [address] [place], [name], [address] Phone: [phone]   Fax:  [phone]  Occupational Therapy Treatment  Patient Details  Name: Katelyn Lamb MRN: [id number] Date of Birth: 1946/04/04 Referring Provider: Carlos Levering PA-C  Encounter Date: 04/04/2017      OT End of Session - 04/04/17 1723    Visit Number 12   Number of Visits 24   Date for OT Re-Evaluation 05/11/17   Authorization Type [employer] - G code required   Authorization Time Period 4th visit from renewal period   Authorization - Visit Number 12   Authorization - Number of Visits 19   OT Start Time 1315   OT Stop Time 1400   OT Time Calculation (min) 45 min   Activity Tolerance Patient tolerated treatment well;Patient limited by pain      Past Medical History:  Diagnosis Date  . Anxiety   . Back pain   . Gait abnormality 11/14/2016  . Myelitis due to herpes simplex [facility])     Past Surgical History:  Procedure Laterality Date  . ABDOMINAL HYSTERECTOMY    . BLADDER REPAIR    . CESAREAN SECTION    . TUBAL LIGATION      There were no vitals filed for this visit.      Subjective Assessment - 04/04/17 1316    Subjective  My Lt arm is getting worse   Patient is accompained by: Family member   Pertinent History HSV2 encephalitis and myelitis, anxiety,    Limitations no driving, fall risk   Patient Stated Goals to be normal and get stronger   Currently in Pain? Yes   Pain Score 10-Worst pain ever   Pain Location Arm   Pain Orientation Left   Pain Type Chronic pain   Pain Onset More than a month ago   Pain Frequency Intermittent   Aggravating Factors  trying to move it   Pain Relieving Factors ice                      OT Treatments/Exercises (OP) - 04/04/17 0001      ADLs   ADL Comments Discussion re: pain in Lt shoulder (different from nerve pain) and recommended seeing orthopedist. Xray  negative for fracture, but pain still present and worse. Compared to 1st evaluation in April 2018, pt has had significant decline in bilateral sh. ROM and ? frozen shoulder bilaterally (Lt worse than Rt). Pt had full ROM at initial eval in April 2018     Exercises   Exercises Shoulder     Shoulder Exercises: ROM/Strengthening   Other ROM/Strengthening Exercises Due to decline in shoulders, pt issued shoulder HEP - see pt instructions for details. Husband present for all education and he was instructed on how to build up grip  for easier grasping onto dowel d/t pt's lack of sensation and dropping items.                 OT Education - 04/04/17 1352    Education provided Yes   Education Details shoulder HEP   Person(s) Educated Patient;Spouse   Methods Explanation;Demonstration;Handout   Comprehension Verbalized understanding;Returned demonstration;Verbal cues required          OT Short Term Goals - 03/21/17 1702      OT SHORT TERM GOAL #1   Title Pt and husband will be mod I with home activities program - 01/05/17   Status Achieved  OT SHORT TERM GOAL #2   Title Pt to feed self 75% of the time AE prn   Status Achieved     OT SHORT TERM GOAL #3   Title Pt will require min a for dressing    Status Achieved  per pt and husband pt only needs assist with buttons/tying     OT SHORT TERM GOAL #4   Title Pt will complete sit to stand with close superivison and vc's for safety as prep for more independent functional mobility   Status Achieved     OT SHORT TERM GOAL #5   Title Improve bilateral UE function as evidenced by performing Box & Blocks with at least an 8 block improvement   Baseline eval: Rt = 17, Lt = 10    Status Not Met  r= 23, L = 12     OT SHORT TERM GOAL #6   Title Pt will be demonstrate ability for simple activity in standing without UE support with minimal dynamic standing balance with close supervision.     Status Achieved     OT SHORT TERM GOAL #7    Title Pt will demonstrate ability for sustained attention with no more than min vc's during functional familiar basic activity   Status Achieved     OT SHORT TERM GOAL #8   Title Pt will perform bathing and dressing with supervison only - all new STG's below due 04/11/17   Time 4   Period Weeks   Status New     OT SHORT TERM GOAL  #9   TITLE Pt will perfom snack/ sandwich prep with supervision and min v.c.   Time 4   Period Weeks   Status New     OT SHORT TERM GOAL  #10   TITLE Pt will demonstrate improved bilateral UE functional use as evidenced by increasing box/ blocks score by 5  blocks   Baseline RUE 29 blocks, LUE 11 blocks   Time 4   Period Weeks   Status New     OT SHORT TERM GOAL  #11   TITLE Pt will increase bilateral grip strength by 5 lbs for increased ease with ADLs/IADLs.   Baseline RUE 17 lbs, LUE 8 lbs   Time 4   Period Weeks   Status New           OT Long Term Goals - 03/21/17 1703      OT LONG TERM GOAL #1   Title Pt and husband Independent with updated HEP - all ongoing and new LTG's due 05/11/17   Time 8   Period Weeks   Status On-going     OT LONG TERM GOAL #2   Title Pt will demonstrate ability to zip and button with vc's only   Time 8   Period Weeks   Status On-going     OT LONG TERM GOAL #3   Title Pt to perform simple snack/sandwich prep S level ( now set as STG)   Baseline now set as STG   Period Weeks   Status Not Met     OT LONG TERM GOAL #4   Title Pt to demo sufficient UE strength as demo by folding laundry for 10 minutes w/o rest in standing   Time 8   Period Weeks   Status On-going     OT LONG TERM GOAL #5   Title Pt to improve grip strength by 10 lbs or greater from initial eval   Status Not  Met     OT LONG TERM GOAL #6   Title Pt will require no more than supervision for dressing    Status Not Met     OT LONG TERM GOAL #7   Title Pt will be supervision for toilet transfers and hygeine   Time 8   Period Weeks    Status On-going     OT LONG TERM GOAL #8   Title Pt will be supervision for shower transfers   Time 8   Status On-going     OT LONG TERM GOAL  #9   Baseline Pt will demonstrate ability for sustained/selective attention for familar functional task with no more than 2 vc's.    Time 8   Period Weeks   Status On-going     OT LONG TERM GOAL  #10   TITLE Pt will perform simple sandwich prep/ basic home management at a modified independent level.   Time 8   Period Weeks   Status New               Plan - 04/04/17 1724    Clinical Impression Statement Pt with decreased shoulder ROM bilaterally (Lt worse than Rt) and ? frozen shoulder. Pt also limited by apraxia, cognitive deficits and decreased sensation bilaterally (Rt hand worse than Lt)   Current Impairments/barriers affecting progress: apraxia, cognitive deficits, ataxia, severe sensory impairment     OT Frequency 2x / week   OT Duration 8 weeks   OT Treatment/Interventions Self-care/ADL training;Moist Heat;Fluidtherapy;DME and/or AE instruction;Patient/family education;Splinting;Therapeutic activities;Neuromuscular education;Functional Mobility Training;Passive range of motion;Cognitive remediation/compensation;Visual/perceptual remediation/compensation;Manual Therapy;Therapeutic exercise;Balance training   Plan continue ADL strategies, review sh. HEP, progress towards goals as able   Consulted and Agree with Plan of Care Patient;Family member/caregiver   Family Member Consulted husband      Patient will benefit from skilled therapeutic intervention in order to improve the following deficits and impairments:  Abnormal gait, Decreased activity tolerance, Decreased balance, Decreased cognition, Decreased coordination, Decreased safety awareness, Decreased mobility, Decreased strength, Difficulty walking, Impaired UE functional use, Impaired tone, Impaired sensation, Impaired vision/preception, Pain  Visit Diagnosis: Muscle  weakness (generalized)  Other symptoms and signs involving the nervous system    Problem List Patient Active Problem List   Diagnosis Date Noted  . Medication monitoring encounter 01/08/2017  . Gait abnormality 11/14/2016  . Hypotension due to drugs   . Urinary retention   . Anxiety about health   . Reactive depression   . Ataxia   . Abdominal spasms   . Constipation due to pain medication   . Acute lower UTI   . Dysuria   . Acute deep vein thrombosis (DVT) of popliteal vein of left lower extremity ([place])   . Incomplete paraplegia ([place])   . Acute blood loss anemia   . Neurogenic bladder   . Neuropathic pain   . Muscle spasm   . Gastroesophageal reflux disease   . Slow transit constipation   . Thrombocytopenia ([place]) 10/03/2016  . Abnormal MRI, spinal cord   . Encephalomyelitis   . Numbness   . Intractable back pain 09/20/2016  . Numbness of left lower extremity 09/20/2016  . Hyponatremia 09/20/2016  . Herpes zoster without complication 40/81/4481    Carey Bullocks, OTR/L 04/04/2017, 5:27 PM  [facility] [address] [place], [name], [address] Phone: [phone]   Fax:  [phone]  Name: Katelyn Lamb MRN: [id number] Date of Birth: June 24, 1946

## 2017-04-04 NOTE — Telephone Encounter (Signed)
I called the husband. The patient has been in physical and occupational therapy, apparently she is starting to regress, not progress. She has been followed through [facility], recent MRI evaluations of the brain, cervical spine, and thoracic spine were done on 03/13/2017.  The MRI studies did not show any progression, good stability was seen.  The patient continues to have low back pain, she has seen Dr. Nelva Bush for this. She is on Xarelto and cannot get epidural steroid injections at the moment.  I will see the patient back sometime in the next week or so. She is having an occasional fall, she is having difficulty using the hands. The neuropathic pain has improved some.  She is on medications that could worsen gait stability such as Lyrica, diazepam, and Xanax.

## 2017-04-04 NOTE — Patient Instructions (Signed)
Flexion (Assistive)    Clasp hands together and raise arms above head, keeping elbows as straight as possible. Can be done sitting or lying. Repeat _10___ times. Do __2__ sessions per day.   ELBOW: Extension / Chest Press (Frame)    Lie on back with knees bent. Straighten elbows to raise dowel. Hold _5__ seconds.  _10__ reps per set, _2__ sets per day    SHOULDER: Flexion - Supine (Cane)    Hold cane in both hands. Raise arms up overhead. Do not allow back to arch. Hold _2__ seconds. _10__ reps per set, _2__ sets per day   ROM: Horizontal Abduction / Adduction - Wand    Keeping both palms down, push right hand across body with other hand. Then pull back across body. Do not allow trunk to twist. Hold _5___ seconds. Repeat _10___ times per set.  Do __2__ sessions per day.

## 2017-04-05 NOTE — Telephone Encounter (Signed)
Called and LVM for husband again. Asked him to call back first thing Monday to let us know if appt on 04/09/17 at 12pm, check in 1130am works for them. Advised office now closed.

## 2017-04-05 NOTE — Telephone Encounter (Signed)
Called and LVM for pt husband to call and schedule a work in visit. Can offer 04/09/17 at 12pm, check in 11:30am if he calls back.

## 2017-04-05 NOTE — Therapy (Signed)
[facility] [address] [place], [name], [address] Phone: [phone]   Fax:  [phone]  Physical Therapy Treatment  Patient Details  Name: Katelyn Lamb MRN: [id number] Date of Birth: [birth year] Referring Provider: Carlos Levering, PA-C  Encounter Date: 04/04/2017      PT End of Session - 04/05/17 9518    Visit Number 19   Number of Visits 23   Date for PT Re-Evaluation 04/12/17   Authorization Type [employer]: G-CODE AND PROGRESS NOTE EVERY 10TH VISIT.    Authorization Time Period 02/04/17 to 04/05/17   PT Start Time 1402   PT Stop Time 1452   PT Time Calculation (min) 50 min      Past Medical History:  Diagnosis Date  . Anxiety   . Back pain   . Gait abnormality 11/14/2016  . Myelitis due to herpes simplex [facility])     Past Surgical History:  Procedure Laterality Date  . ABDOMINAL HYSTERECTOMY    . BLADDER REPAIR    . CESAREAN SECTION    . TUBAL LIGATION      There were no vitals filed for this visit.      Subjective Assessment - 04/05/17 1328    Subjective Pt states she has good days and bad days - states she was not having a good day when PT goals were checked at last session; states she does not have good balance and wishes it could be improved   Patient is accompained by: Family member   Pertinent History LLE DVT, anxiety, B cataract surgery in 2017   Patient Stated Goals Pt would like to be IND in everything (walk, feed dog, and perform ADLs safely).    Currently in Pain? Yes   Pain Score 10-Worst pain ever   Pain Location Arm   Pain Orientation Left   Pain Descriptors / Indicators Aching   Pain Type Neuropathic pain   Pain Onset More than a month ago   Pain Frequency Intermittent   Multiple Pain Sites No               NeuroRe-ed:   Pt performed alternate tap ups to 1st and 2nd step (6")  without UE support with CGA to min assist ALternate stepping up/back on incline  and on decline 10 reps each leg with CGA  Marching in place on incline and then on decline 10 reps with CGA to min assist for balance recovery            Balance Exercises - 04/05/17 1331      Balance Exercises: Standing   Standing Eyes Opened Wide (BOA);Head turns;3 reps   Rockerboard Anterior/posterior;EO;Intermittent UE support;10 reps   Other Standing Exercises Pt performed balance exercises at counter for HEP:  includiing forward, back and side kicks x 10 reps each;  marching in place x 10 reps each leg:  partial tandem stance and SLS; sidestepping along counter:  crossovers front - alternating feet - standing in place - 10 reps each with min to [place]           PT Education - 04/05/17 1337    Education provided Yes   Education Details Balance HEP   Person(s) Educated Patient;Spouse   Methods Explanation;Demonstration;Handout   Comprehension Verbalized understanding;Returned demonstration          PT Short Term Goals - 03/01/17 1634      PT SHORT TERM GOAL #1   Title Patient/caregiver will be independent with updated HEP  to improve strength and balance. (TARGET date 03/01/17)   Time 4   Period Weeks   Status Achieved     PT SHORT TERM GOAL #2   Title Patient will improve Berg balance to >=21/56 to demonstrate improved balance and lesser risk of falls. (TARGET date 03/01/17)   Baseline 6/29 46/56   Time 4   Period Weeks   Status Achieved     PT SHORT TERM GOAL #3   Title Patient will ambulate 300 ft with least restrictive assistive device vs no device with supervision. (TARGET date 03/01/17)   Baseline 6/29 minguard due to imbalance   Time 4   Period Weeks   Status Partially Met           PT Long Term Goals - 03/29/17 1546      PT LONG TERM GOAL #1   Title Pt will improve gait speed to >/=2.59f/sec., no AD, to safely amb. in the community. TARGET DATE FOR ALL LTGS: 01/31/17   Baseline 6/4  2.21 ft/sec   Status Partially Met     PT LONG TERM GOAL #2    Title Pt will improve BERG score to >/=45/56 to decr. falls risk.    Baseline 6/4  14/56 (after hospitalized for medical decline)   Status Not Met     PT LONG TERM GOAL #3   Title Pt will amb. 600' over even/uneven terrain, at MOD I level, to progress to walking dog safely.    Baseline 6/4 requires min assist over level, indoor    Status Not Met     PT LONG TERM GOAL #4   Title Pt will verbalize understanding of fall prevention strategies to reduce falls risk.    Baseline 6/4 Husband can report education provided. He found no areas needing to be addressed at home. Pt unable to report on education.   Status Partially Met     PT LONG TERM GOAL #5   Title Patient/caregiver will be independent in updated HEP. (TARGET 03/29/17)   Baseline ALL UNMET GOALS WILL BE CARRIED OVER TO NEW POC: 04/12/17   Time 8   Period Weeks   Status Not Met     PT LONG TERM GOAL #6   Title Patient will improve gait velocity to >2.62 ft/sec while maintaining balance & proper use of DME (to indicate incr safety with community ambulation). (TARGET 03/29/17) Updated 7/9   Time 8   Period Weeks   Status Not Met     PT LONG TERM GOAL #7   Title Patient will improve Berg score to >= 48/56 to indicate improved balance and lesser risk of falls. (TARGET 03/29/17) updated 7/9   Time 8   Period Weeks   Status Not Met     PT LONG TERM GOAL #8   Title Patient will ambulate 600 ft over level and uneven terrain with least restrictive device (vs no device) with supervision for safety. (TARGET 03/29/17)   Time 8   Period Weeks   Status Not Met               Plan - 04/05/17 1339    Clinical Impression Statement Pt reports that the only balance exercise she has for home program is standing and turning head side to side;  added balance exercises to HEP, as program appears to mostly consist of strengthening exercises.  Pt does not use assistive device for assistance with ambulation due to her stating  inability to do  so due to  lack of sensation in bi. hands.   Rehab Potential Good   PT Frequency 2x / week   PT Duration 2 weeks   PT Treatment/Interventions ADLs/Self Care Home Management;Electrical Stimulation;Biofeedback;Neuromuscular re-education;Balance training;Therapeutic exercise;Therapeutic activities;Manual techniques;Functional mobility training;Stair training;Gait training;DME Instruction;Orthotic Fit/Training;Patient/family education;Vestibular   PT Next Visit Plan Check balance HEP to determine if pt/spouse has any ?'s regarding these new exs. added on 04-04-17 to HEP:  plan per primary PT, Geoffry Paradise, is for pt to be D/C'd from PT next by (04-12-17) due to lack of progress   Consulted and Agree with Plan of Care Patient;Family member/caregiver   Family Member Consulted husband: [name]      Patient will benefit from skilled therapeutic intervention in order to improve the following deficits and impairments:  Abnormal gait, Pain, Decreased knowledge of use of DME, Decreased strength, Decreased mobility, Decreased balance, Decreased range of motion, Decreased coordination, Decreased safety awareness, Impaired flexibility, Impaired sensation  Visit Diagnosis: Unsteadiness on feet  Other abnormalities of gait and mobility     Problem List Patient Active Problem List   Diagnosis Date Noted  . Medication monitoring encounter 01/08/2017  . Gait abnormality 11/14/2016  . Hypotension due to drugs   . Urinary retention   . Anxiety about health   . Reactive depression   . Ataxia   . Abdominal spasms   . Constipation due to pain medication   . Acute lower UTI   . Dysuria   . Acute deep vein thrombosis (DVT) of popliteal vein of left lower extremity ([place])   . Incomplete paraplegia ([place])   . Acute blood loss anemia   . Neurogenic bladder   . Neuropathic pain   . Muscle spasm   . Gastroesophageal reflux disease   . Slow transit constipation   . Thrombocytopenia ([place]) 10/03/2016  . Abnormal  MRI, spinal cord   . Encephalomyelitis   . Numbness   . Intractable back pain 09/20/2016  . Numbness of left lower extremity 09/20/2016  . Hyponatremia 09/20/2016  . Herpes zoster without complication 32/10/3341    DildayJenness Corner, PT 04/05/2017, 1:45 PM  [facility] [address] [place], [name], [address] Phone: [phone]   Fax:  [phone]  Name: SHAMRA BRADEEN MRN: [id number] Date of Birth: [birth year]

## 2017-04-05 NOTE — Patient Instructions (Signed)
Standing Marching   Using a chair if necessary, march in place. Repeat 10 times. Do 1 sessions per day.  http://gt2.exer.us/344   Copyright  VHI. All rights reserved.   Hip Backward Kick   Using a chair for balance, keep legs shoulder width apart and toes pointed for- ward. Slowly extend one leg back, keeping knee straight. Do not lean forward. Repeat with other leg. Repeat 10 times. Do 1 sessions per day.  http://gt2.exer.us/340   Copyright  VHI. All rights reserved.     Hip Side Kick   Holding a chair for balance, keep legs shoulder width apart and toes pointed forward. Swing a leg out to side, keeping knee straight. Do not lean. Repeat using other leg. Repeat 10 times. Do 1 sessions per day.  http://gt2.exer.us/342   Copyright  VHI. All rights reserved.   Feet Heel-Toe "Tandem", Varied Arm Positions - Eyes Open   With eyes open, right foot directly in front of the other, arms out, look straight ahead at a stationary object. Hold 30 seconds. Repeat 1  times per session. Do 1sessions per day.  Copyright  VHI. All rights reserved.       Standing On One Leg Without Support .  Stand on one leg in neutral spine without support. Hold 10 seconds. Repeat on other leg. Do 2 repetitions, 1 sets.  http://bt.exer.us/36   Copyright  VHI. All rights reserved.    Side-Stepping   Walk to left side with eyes open. Take even steps, leading with same foot. Make sure each foot lifts off the floor. Repeat in opposite direction. Repeat for 10 minutes per session. Do  1 sessions per day.   Copyright  VHI. All rights reserved.

## 2017-04-08 ENCOUNTER — Ambulatory Visit: Payer: PPO | Admitting: Physical Therapy

## 2017-04-08 NOTE — Telephone Encounter (Signed)
Noted, thank you

## 2017-04-08 NOTE — Telephone Encounter (Signed)
Pt husband called back, confirmed they want appointment.  Spoke with RN Terrence Dupont.  Appointment scheduled 8-7, check in time of 11:30 for 12 appointment

## 2017-04-09 ENCOUNTER — Encounter: Payer: Self-pay | Admitting: Physical Therapy

## 2017-04-09 ENCOUNTER — Encounter: Payer: Self-pay | Admitting: Neurology

## 2017-04-09 ENCOUNTER — Ambulatory Visit: Payer: PPO | Admitting: Occupational Therapy

## 2017-04-09 ENCOUNTER — Ambulatory Visit (INDEPENDENT_AMBULATORY_CARE_PROVIDER_SITE_OTHER): Payer: PPO | Admitting: Neurology

## 2017-04-09 ENCOUNTER — Ambulatory Visit: Payer: PPO | Admitting: Physical Therapy

## 2017-04-09 VITALS — BP 91/69 | HR 87 | Ht 66.0 in | Wt 137.5 lb

## 2017-04-09 DIAGNOSIS — G0490 Encephalitis and encephalomyelitis, unspecified: Secondary | ICD-10-CM

## 2017-04-09 DIAGNOSIS — M6281 Muscle weakness (generalized): Secondary | ICD-10-CM

## 2017-04-09 DIAGNOSIS — R270 Ataxia, unspecified: Secondary | ICD-10-CM | POA: Diagnosis not present

## 2017-04-09 DIAGNOSIS — R29818 Other symptoms and signs involving the nervous system: Secondary | ICD-10-CM

## 2017-04-09 DIAGNOSIS — R278 Other lack of coordination: Secondary | ICD-10-CM

## 2017-04-09 DIAGNOSIS — R2689 Other abnormalities of gait and mobility: Secondary | ICD-10-CM

## 2017-04-09 DIAGNOSIS — R269 Unspecified abnormalities of gait and mobility: Secondary | ICD-10-CM | POA: Diagnosis not present

## 2017-04-09 DIAGNOSIS — R208 Other disturbances of skin sensation: Secondary | ICD-10-CM

## 2017-04-09 DIAGNOSIS — R2681 Unsteadiness on feet: Secondary | ICD-10-CM | POA: Diagnosis not present

## 2017-04-09 NOTE — Progress Notes (Signed)
Reason for visit: Encephalomyelitis  Katelyn Lamb is an 71 y.o. 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 The patient has had residual issues with function of the arms and legs, she has had good improvement with her neuropathic pain on Cymbalta and Lyrica. The patient continues to have gait instability, she is getting physical and occupational therapy. Her physical therapist is concerned that she is regressing. The patient indicates that she suffers from depression, she sleeps during the day, she has difficulty sleeping at night, she may stay awake for 5 hours or more. The patient has an occasional fall, she does not use a cane or walker for ambulation, mainly because she feels that she cannot grip with her hands well enough to use assistive devices. The patient indicates that her bowels and bladder are working well. She denies any memory disturbance. She has bilateral shoulder pain and she is being evaluated through orthopedic surgery for this. She has chronic low back pain and is followed by Dr. Nelva Bush, but she is on blood thinners and cannot have epidural steroid injections. The patient is on diazepam taking 5 mg twice daily, she claims that this was started initially for spasticity issues. She takes alprazolam 1 mg tablet if needed, but she does not take this frequently. She remains on Lyrica taking 50 mg in the morning and 100 mg in the evening. She remains on acyclovir suppressive therapy.  Past Medical History:  Diagnosis Date  . Anxiety   . Back pain   . Gait abnormality 11/14/2016  . Myelitis due to herpes simplex [facility])     Past Surgical History:  Procedure Laterality Date  . ABDOMINAL HYSTERECTOMY    . BLADDER REPAIR    . CESAREAN SECTION    . TUBAL LIGATION      Family History  Problem Relation Age of Onset  . Hypertension Mother     Social  history:  reports that she has quit smoking. She has never used smokeless tobacco. She reports that she does not drink alcohol or use drugs.    Allergies  Allergen Reactions  . Demerol [Meperidine] Other (See Comments)    Hallucinations  . Amoxicillin-Pot Clavulanate Other (See Comments)  . Penicillins Other (See Comments)    Medications:  Prior to Admission medications   Medication Sig Start Date End Date Taking? Authorizing Provider  acyclovir (ZOVIRAX) 400 MG tablet Take 1 tablet (400 mg total) by mouth 2 (two) times daily. 12/26/16   [name], MD  ALPRAZolam Duanne Moron) 1 MG tablet Take 1 mg by mouth as needed. 10/26/16   [provider]  calcium carbonate (OS-CAL) 600 MG TABS tablet Take 600 mg by mouth 2 (two) times daily with a meal.    [provider]  cholecalciferol (VITAMIN D) 1000 units tablet Take 1,000 Units by mouth daily.    [provider]  diazepam (VALIUM) 5 MG tablet Take 5 mg by mouth 2 (two) times daily. 10/26/16   [provider]  DULoxetine (CYMBALTA) 60 MG capsule Take 1 capsule (60 mg total) by mouth 2 (two) times daily. 02/08/17   [name], MD  HYDROcodone-acetaminophen [facility]) 10-325 MG tablet Take 1-2 tablets by mouth every 4 (four) hours as needed for moderate pain. 10/26/16   Angiulli, Lavon Paganini, PA-C  omega-3 acid ethyl esters (LOVAZA) 1 g capsule Take 1 capsule (1 g  total) by mouth 2 (two) times daily. 10/26/16   Angiulli, Lavon Paganini, PA-C  pantoprazole (PROTONIX) 40 MG tablet Take 1 tablet (40 mg total) by mouth daily. 10/26/16   Angiulli, Lavon Paganini, PA-C  polyethylene glycol (MIRALAX / GLYCOLAX) packet Take 17 g by mouth daily. Patient taking differently: Take 17 g by mouth daily as needed.  10/26/16   Angiulli, Lavon Paganini, PA-C  pregabalin (LYRICA) 50 MG capsule One capsule in the morning and 2 in the evening 03/04/17   [name], MD  rivaroxaban Alveda Reasons) 20 MG TABS tablet 20 mg tablet daily to begin 10/31/2016  10/26/16   Angiulli, Lavon Paganini, PA-C    ROS:  Out of a complete 14 system review of symptoms, the patient complains only of the following symptoms, and all other reviewed systems are negative.  Decreased appetite, chills, fatigue Hearing loss, loss of vision Daytime sleepiness Back pain, walking difficulty Memory loss, numbness, weakness Confusion, depression   Blood pressure 91/69, pulse 87, height 5\' 6"  (1.676 m), weight 137 lb 8 oz (62.4 kg).  Physical Exam  General: The patient is alert and cooperative at the time of the examination.  Skin: No significant peripheral edema is noted.   Neurologic Exam  Mental status: The patient is alert and oriented x 3 at the time of the examination. The patient has apparent normal recent and remote memory, with an apparently normal attention span and concentration ability.   Cranial nerves: Facial symmetry is present. Speech is normal, no aphasia or dysarthria is noted. Extraocular movements are full. Visual fields are full.  Motor: The patient has good strength in all 4 extremities, with exception of 4/5 strength with the triceps muscles bilaterally and with grip strength bilaterally.  Sensory examination: Soft touch sensation is symmetric on the face, arms, and legs.  Coordination: The patient has some ataxia of finger-nose-finger and heel-to-shin bilaterally.  Gait and station: The patient has a wide-based, unsteady gait. Tandem gait was not attempted. Romberg is positive, the patient falls backwards.  Reflexes: Deep tendon reflexes are symmetric.   Assessment/Plan:  1. Encephalomyelitis   2. Gait disturbance   3. Chronic low back pain   The patient indicates that she is having significant problems with depression. The patient will be cut back on Lyrica to help with this issue and to improve gait stability. She will go to 50 mg twice daily for 2 weeks and if possible go back to 50 mg at night. Eventually we may try to taper off  of the diazepam as well. If the depression worsens, we may need to add Abilify to the Cymbalta. The patient will follow-up in 3 months. I do not see any significant changes in her clinical examination today as compared to the exam in June 2018. The patient just had a MRI of the brain, cervical spine, and thoracic spine in mid July 2018. This was done through [facility] and revealed good stability of the central nervous system lesions.  Jill Alexanders MD 04/09/2017 12:47 PM  [facility] [address] [place], [place] [address]  Phone [phone] Fax [phone]

## 2017-04-09 NOTE — Therapy (Signed)
[facility] [address] [place], [name], [address] Phone: [phone]   Fax:  [phone]  Physical Therapy Treatment  Patient Details  Name: Katelyn Lamb MRN: [id number] Date of Birth: 1946-07-06 Referring Provider: Carlos Levering, PA-C  Encounter Date: 04/09/2017      PT End of Session - 04/09/17 1454    Visit Number 20   Number of Visits 23   Date for PT Re-Evaluation 04/12/17   Authorization Type [employer]: G-CODE AND PROGRESS NOTE EVERY 10TH VISIT.    Authorization Time Period 02/04/17 to 04/05/17   PT Start Time 1447   PT Stop Time 1530   PT Time Calculation (min) 43 min   Equipment Utilized During Treatment Gait belt   Activity Tolerance Patient tolerated treatment well   Behavior During Therapy WFL for tasks assessed/performed      Past Medical History:  Diagnosis Date  . Anxiety   . Back pain   . Gait abnormality 11/14/2016  . Myelitis due to herpes simplex [facility])     Past Surgical History:  Procedure Laterality Date  . ABDOMINAL HYSTERECTOMY    . BLADDER REPAIR    . CESAREAN SECTION    . TUBAL LIGATION      There were no vitals filed for this visit.      Subjective Assessment - 04/09/17 1450    Subjective No new complaints. Has had a fall last night. Was bending forward to pick up card of floor and came up too fast hitting her head on night stand. Informed Dr. Jannifer Franklin who was not concerned.   Patient is accompained by: Family member   Pertinent History LLE DVT, anxiety, B cataract surgery in 2017   Patient Stated Goals Pt would like to be IND in everything (walk, feed dog, and perform ADLs safely).    Currently in Pain? Yes   Pain Score 5    Pain Location Generalized  legs and left shoulder    Pain Descriptors / Indicators Aching;Sore;Tender   Pain Type Chronic pain   Pain Onset More than a month ago   Pain Frequency Intermittent   Aggravating Factors  increased movement,  activity   Pain Relieving Factors rest            [facility] PT Assessment - 04/09/17 1455      Transfers   Transfers Sit to Stand;Stand to Sit   Sit to Stand 5: Supervision;With upper extremity assist;From bed;From chair/3-in-1   Stand to Sit 5: Supervision;With upper extremity assist;To bed;To chair/3-in-1     Ambulation/Gait   Ambulation/Gait Yes   Ambulation/Gait Assistance 4: Min guard;4: Min assist   Ambulation/Gait Assistance Details mulitple episodes of toe scuffing with gait needing min assist to prevent forward falls. increased assistance on outdoor paved surfaces.    Ambulation Distance (Feet) 450 Feet  x1 indoors, 350 x1 in/outdoors   Assistive device None   Gait Pattern Step-through pattern;Decreased arm swing - right;Decreased arm swing - left;Decreased step length - right;Decreased step length - left;Lateral hip instability;Decreased trunk rotation;Poor foot clearance - right;Narrow base of support;Decreased dorsiflexion - right;Shuffle   Ambulation Surface Level;Indoor   Gait velocity 17.03= 1.92 ft.sec     Berg Balance Test   Sit to Stand Able to stand  independently using hands   Standing Unsupported Able to stand 2 minutes with supervision   Sitting with Back Unsupported but Feet Supported on Floor or Stool Able to sit safely and securely 2 minutes   Stand  to Sit Controls descent by using hands   Transfers Able to transfer safely, definite need of hands   Standing Unsupported with Eyes Closed Able to stand 10 seconds with supervision   Standing Ubsupported with Feet Together Able to place feet together independently and stand for 1 minute with supervision   From Standing, Reach Forward with Outstretched Arm Can reach forward >12 cm safely (5")  6.5 inches   From Standing Position, Pick up Object from [place] to pick up shoe, needs supervision   From Standing Position, Turn to Look Behind Over each Shoulder Looks behind from both sides and weight shifts well    Turn 360 Degrees Able to turn 360 degrees safely one side only in 4 seconds or less  4 secs to right, 5.56 to left   Standing Unsupported, Alternately Place Feet on Step/Stool Able to complete 4 steps without aid or supervision  12.75 sec's   Standing Unsupported, One Foot in Front Able to plae foot ahead of the other independently and hold 30 seconds   Standing on One Leg Able to lift leg independently and hold equal to or more than 3 seconds   Total Score 42   Berg comment: 42/56= significant risk of falling            PT Short Term Goals - 03/01/17 1634      PT SHORT TERM GOAL #1   Title Patient/caregiver will be independent with updated HEP to improve strength and balance. (TARGET date 03/01/17)   Time 4   Period Weeks   Status Achieved     PT SHORT TERM GOAL #2   Title Patient will improve Berg balance to >=21/56 to demonstrate improved balance and lesser risk of falls. (TARGET date 03/01/17)   Baseline 6/29 46/56   Time 4   Period Weeks   Status Achieved     PT SHORT TERM GOAL #3   Title Patient will ambulate 300 ft with least restrictive assistive device vs no device with supervision. (TARGET date 03/01/17)   Baseline 6/29 minguard due to imbalance   Time 4   Period Weeks   Status Partially Met           PT Long Term Goals - 04/09/17 1454      PT LONG TERM GOAL #1   Title --   Baseline --   Status --     PT LONG TERM GOAL #2   Title --   Baseline --   Status --     PT LONG TERM GOAL #3   Title --   Baseline --   Status --     PT LONG TERM GOAL #4   Title --   Baseline --   Status --     PT LONG TERM GOAL #5   Title Patient/caregiver will be independent in updated HEP. (TARGET 03/29/17)   Baseline 04/09/17: met per pt and spouse report today   Time --   Period --   Status Achieved     PT LONG TERM GOAL #6   Title Patient will improve gait velocity to >2.62 ft/sec while maintaining balance & proper use of DME (to indicate incr safety with  community ambulation). (TARGET 03/29/17) Updated 7/9   Baseline 04/09/17: 1.92 ft/sec with no AD up to min assist for balance   Time --   Period --   Status Not Met     PT LONG TERM GOAL #7   Title Patient will improve Merrilee Jansky  score to >= 48/56 to indicate improved balance and lesser risk of falls. (TARGET 03/29/17) updated 7/9   Baseline 04-16-2017: 42/56 scored today, improved from 27/56 last check just not to goal   Time --   Period --   Status Partially Met     PT LONG TERM GOAL #8   Title Patient will ambulate 600 ft over level and uneven terrain with least restrictive device (vs no device) with supervision for safety. (TARGET 03/29/17)   Baseline 2017/04/16: pt unable to achieve this distance and continues to need up to min assist for balance/safety with gait due to toe scuffing/multiple balance lossess   Time --   Period --   Status Not Met               Plan - Apr 16, 2017 1454    Clinical Impression Statement Today's skilled session focused on checking progress toward LTGs 5-8 (current goals). Pt met 1 goal, partially met 1 goal and did not meet 2 goals.    Rehab Potential Good   PT Frequency 2x / week   PT Duration 2 weeks   PT Treatment/Interventions ADLs/Self Care Home Management;Electrical Stimulation;Biofeedback;Neuromuscular re-education;Balance training;Therapeutic exercise;Therapeutic activities;Manual techniques;Functional mobility training;Stair training;Gait training;DME Instruction;Orthotic Fit/Training;Patient/family education;Vestibular   PT Next Visit Plan discharge today per primary PT's plan of care   Consulted and Agree with Plan of Care Patient;Family member/caregiver   Family Member Consulted husband: [name]      Patient will benefit from skilled therapeutic intervention in order to improve the following deficits and impairments:  Abnormal gait, Pain, Decreased knowledge of use of DME, Decreased strength, Decreased mobility, Decreased balance, Decreased range of  motion, Decreased coordination, Decreased safety awareness, Impaired flexibility, Impaired sensation  Visit Diagnosis: Muscle weakness (generalized)  Other lack of coordination  Unsteadiness on feet  Other abnormalities of gait and mobility     G-Codes - 2017/04/16 2114-11-07    Functional Assessment Tool Used (Outpatient Only) gait velocity 1.92  ft/sec; BERG: 42/56   Functional Limitation Mobility: Walking and moving around   Mobility: Walking and Moving Around Goal Status [id number]) At least 20 percent but less than 40 percent impaired, limited or restricted   Mobility: Walking and Moving Around Discharge Status [phone]) At least 20 percent but less than 40 percent impaired, limited or restricted       Problem List Patient Active Problem List   Diagnosis Date Noted  . Medication monitoring encounter 01/08/2017  . Gait abnormality 11/14/2016  . Hypotension due to drugs   . Urinary retention   . Anxiety about health   . Reactive depression   . Ataxia   . Abdominal spasms   . Constipation due to pain medication   . Acute lower UTI   . Dysuria   . Acute deep vein thrombosis (DVT) of popliteal vein of left lower extremity ([place])   . Incomplete paraplegia ([place])   . Acute blood loss anemia   . Neurogenic bladder   . Neuropathic pain   . Muscle spasm   . Gastroesophageal reflux disease   . Slow transit constipation   . Thrombocytopenia ([place]) 10/03/2016  . Abnormal MRI, spinal cord   . Encephalomyelitis   . Numbness   . Intractable back pain 09/20/2016  . Numbness of left lower extremity 09/20/2016  . Hyponatremia 09/20/2016  . Herpes zoster without complication 95/05/3266    Willow Ora, PTA, [facility] [address], [place], [place] [address] [phone] April 16, 2017, 9:19 PM  Name: Katelyn Lamb MRN: [id number] Date of Birth: 1945-10-02  PHYSICAL THERAPY DISCHARGE SUMMARY  Visits from Start of Care: 20  Current functional level related to  goals / functional outcomes:     PT Long Term Goals - 04/09/17 1454      PT LONG TERM GOAL #1   Title --   Baseline --   Status --     PT LONG TERM GOAL #2   Title --   Baseline --   Status --     PT LONG TERM GOAL #3   Title --   Baseline --   Status --     PT LONG TERM GOAL #4   Title --   Baseline --   Status --     PT LONG TERM GOAL #5   Title Patient/caregiver will be independent in updated HEP. (TARGET 03/29/17)   Baseline 04/09/17: met per pt and spouse report today   Time --   Period --   Status Achieved     PT LONG TERM GOAL #6   Title Patient will improve gait velocity to >2.62 ft/sec while maintaining balance & proper use of DME (to indicate incr safety with community ambulation). (TARGET 03/29/17) Updated 7/9   Baseline 04/09/17: 1.92 ft/sec with no AD up to min assist for balance   Time --   Period --   Status Not Met     PT LONG TERM GOAL #7   Title Patient will improve Berg score to >= 48/56 to indicate improved balance and lesser risk of falls. (TARGET 03/29/17) updated 7/9   Baseline 04/09/17: 42/56 scored today, improved from 27/56 last check just not to goal   Time --   Period --   Status Partially Met     PT LONG TERM GOAL #8   Title Patient will ambulate 600 ft over level and uneven terrain with least restrictive device (vs no device) with supervision for safety. (TARGET 03/29/17)   Baseline 04/09/17: pt unable to achieve this distance and continues to need up to min assist for balance/safety with gait due to toe scuffing/multiple balance lossess   Time --   Period --   Status Not Met        Remaining deficits: Increased risk for falls, impaired balance. Pt stated she does not perform HEP consistently and refuses to use RW, stating she can't grip RW, despite pt's ability to grip PT's hand during amb. PT unable to progress pt further, if pt does not attempt HEP and ambulation with AD.   Education / Equipment: HEP  Plan: Patient agrees to  discharge.  Patient goals were partially met. Patient is being discharged due to lack of progress.  ?????         Geoffry Paradise, PT,DPT 04/10/17 1:40 PM Phone: [phone] Fax: [phone]

## 2017-04-09 NOTE — Therapy (Signed)
[facility] [address] [place], [name], [address] Phone: [phone]   Fax:  [phone]  Occupational Therapy Treatment  Patient Details  Name: Katelyn Lamb MRN: [id number] Date of Birth: 16-May-1946 Referring Provider: Carlos Levering PA-C  Encounter Date: 04/09/2017      OT End of Session - 04/09/17 1419    Visit Number 13   Number of Visits 24   Date for OT Re-Evaluation 05/11/17   Authorization Type [employer] - G code required   Authorization Time Period --   Authorization - Visit Number 13   Authorization - Number of Visits 19   OT Start Time 1417   OT Stop Time 1445   OT Time Calculation (min) 28 min      Past Medical History:  Diagnosis Date  . Anxiety   . Back pain   . Gait abnormality 11/14/2016  . Myelitis due to herpes simplex [facility])     Past Surgical History:  Procedure Laterality Date  . ABDOMINAL HYSTERECTOMY    . BLADDER REPAIR    . CESAREAN SECTION    . TUBAL LIGATION      There were no vitals filed for this visit.      Subjective Assessment - 04/09/17 1416    Subjective  Pt reports she has an appt scheduled with Dr. Onnie Graham regarding her shoulders   Pertinent History HSV2 encephalitis and myelitis, anxiety,    Limitations no driving, fall risk   Patient Stated Goals to be normal and get stronger   Currently in Pain? Yes   Pain Score 1    Pain Location Shoulder   Pain Orientation Left   Pain Descriptors / Indicators Aching   Pain Type Chronic pain   Pain Onset More than a month ago   Pain Frequency Intermittent   Aggravating Factors  movement   Pain Relieving Factors rest   Pain Score 5   Pain Location Leg   Pain Orientation Right;Left   Pain Descriptors / Indicators Burning   Pain Type Chronic pain   Pain Onset More than a month ago   Pain Frequency Intermittent   Aggravating Factors  walking   Pain Relieving Factors unknown                 Treatment: Functional midrange reaching with RUE, low range reaching with LUE to place and remove yellow and red clothespins for sustained pinch, min-mod facilitation/ v.c              OT Education - 04/09/17 1501    Education provided Yes   Education Details reveiwed cane exercises in supine   Person(s) Educated Patient;Spouse   Methods Explanation;Demonstration;Tactile cues;Verbal cues   Comprehension Verbalized understanding;Returned demonstration;Verbal cues required;Tactile cues required          OT Short Term Goals - 03/21/17 1702      OT SHORT TERM GOAL #1   Title Pt and husband will be mod I with home activities program - 01/05/17   Status Achieved     OT SHORT TERM GOAL #2   Title Pt to feed self 75% of the time AE prn   Status Achieved     OT SHORT TERM GOAL #3   Title Pt will require min a for dressing    Status Achieved  per pt and husband pt only needs assist with buttons/tying     OT SHORT TERM GOAL #4   Title Pt will complete sit to stand  with close superivison and vc's for safety as prep for more independent functional mobility   Status Achieved     OT SHORT TERM GOAL #5   Title Improve bilateral UE function as evidenced by performing Box & Blocks with at least an 8 block improvement   Baseline eval: Rt = 17, Lt = 10    Status Not Met  r= 23, L = 12     OT SHORT TERM GOAL #6   Title Pt will be demonstrate ability for simple activity in standing without UE support with minimal dynamic standing balance with close supervision.     Status Achieved     OT SHORT TERM GOAL #7   Title Pt will demonstrate ability for sustained attention with no more than min vc's during functional familiar basic activity   Status Achieved     OT SHORT TERM GOAL #8   Title Pt will perform bathing and dressing with supervison only - all new STG's below due 04/11/17   Time 4   Period Weeks   Status New     OT SHORT TERM GOAL  #9   TITLE Pt will  perfom snack/ sandwich prep with supervision and min v.c.   Time 4   Period Weeks   Status New     OT SHORT TERM GOAL  #10   TITLE Pt will demonstrate improved bilateral UE functional use as evidenced by increasing box/ blocks score by 5  blocks   Baseline RUE 29 blocks, LUE 11 blocks   Time 4   Period Weeks   Status New     OT SHORT TERM GOAL  #11   TITLE Pt will increase bilateral grip strength by 5 lbs for increased ease with ADLs/IADLs.   Baseline RUE 17 lbs, LUE 8 lbs   Time 4   Period Weeks   Status New           OT Long Term Goals - 03/21/17 1703      OT LONG TERM GOAL #1   Title Pt and husband Independent with updated HEP - all ongoing and new LTG's due 05/11/17   Time 8   Period Weeks   Status On-going     OT LONG TERM GOAL #2   Title Pt will demonstrate ability to zip and button with vc's only   Time 8   Period Weeks   Status On-going     OT LONG TERM GOAL #3   Title Pt to perform simple snack/sandwich prep S level ( now set as STG)   Baseline now set as STG   Period Weeks   Status Not Met     OT LONG TERM GOAL #4   Title Pt to demo sufficient UE strength as demo by folding laundry for 10 minutes w/o rest in standing   Time 8   Period Weeks   Status On-going     OT LONG TERM GOAL #5   Title Pt to improve grip strength by 10 lbs or greater from initial eval   Status Not Met     OT LONG TERM GOAL #6   Title Pt will require no more than supervision for dressing    Status Not Met     OT LONG TERM GOAL #7   Title Pt will be supervision for toilet transfers and hygeine   Time 8   Period Weeks   Status On-going     OT LONG TERM GOAL #8   Title Pt will  be supervision for shower transfers   Time 8   Status On-going     OT LONG TERM GOAL  #9   Baseline Pt will demonstrate ability for sustained/selective attention for familar functional task with no more than 2 vc's.    Time 8   Period Weeks   Status On-going     OT LONG TERM GOAL  #10   TITLE  Pt will perform simple sandwich prep/ basic home management at a modified independent level.   Time 8   Period Weeks   Status New               Plan - 04/09/17 1459    Clinical Impression Statement Pt is progressing slowly towards goals. She is limited by pain, cognitive and sensory deficits.   Rehab Potential Fair   Current Impairments/barriers affecting progress: apraxia, cognitive deficits, ataxia, severe sensory impairment     OT Frequency 2x / week   OT Duration 8 weeks   OT Treatment/Interventions Self-care/ADL training;Moist Heat;Fluidtherapy;DME and/or AE instruction;Patient/family education;Splinting;Therapeutic activities;Neuromuscular education;Functional Mobility Training;Passive range of motion;Cognitive remediation/compensation;Visual/perceptual remediation/compensation;Manual Therapy;Therapeutic exercise;Balance training   Plan work towards ADL strategies   Consulted and Agree with Plan of Care Patient;Family member/caregiver   Family Member Consulted husband      Patient will benefit from skilled therapeutic intervention in order to improve the following deficits and impairments:  Abnormal gait, Decreased activity tolerance, Decreased balance, Decreased cognition, Decreased coordination, Decreased safety awareness, Decreased mobility, Decreased strength, Difficulty walking, Impaired UE functional use, Impaired tone, Impaired sensation, Impaired vision/preception, Pain  Visit Diagnosis: Muscle weakness (generalized)  Other symptoms and signs involving the nervous system  Other lack of coordination  Other disturbances of skin sensation    Problem List Patient Active Problem List   Diagnosis Date Noted  . Medication monitoring encounter 01/08/2017  . Gait abnormality 11/14/2016  . Hypotension due to drugs   . Urinary retention   . Anxiety about health   . Reactive depression   . Ataxia   . Abdominal spasms   . Constipation due to pain medication   .  Acute lower UTI   . Dysuria   . Acute deep vein thrombosis (DVT) of popliteal vein of left lower extremity ([place])   . Incomplete paraplegia ([place])   . Acute blood loss anemia   . Neurogenic bladder   . Neuropathic pain   . Muscle spasm   . Gastroesophageal reflux disease   . Slow transit constipation   . Thrombocytopenia ([place]) 10/03/2016  . Abnormal MRI, spinal cord   . Encephalomyelitis   . Numbness   . Intractable back pain 09/20/2016  . Numbness of left lower extremity 09/20/2016  . Hyponatremia 09/20/2016  . Herpes zoster without complication 07/02/1313    RINE,KATHRYN 04/09/2017, 3:02 PM Theone Murdoch, OTR/L [phone] Phone: [phone] 3:09 PM 04/09/17 [facility] [address] [place], [name], [address] Phone: [phone]   Fax:  [phone]  Name: TANGALA WIEGERT MRN: [id number] Date of Birth: [birth year]

## 2017-04-09 NOTE — Patient Instructions (Signed)
   We will cut back on the Lyrica to 50 mg twice a day for 2 weeks, then start 50 mg at night only.

## 2017-04-11 ENCOUNTER — Ambulatory Visit: Payer: PPO | Admitting: Physical Therapy

## 2017-04-11 ENCOUNTER — Ambulatory Visit: Payer: PPO | Admitting: Occupational Therapy

## 2017-04-15 ENCOUNTER — Encounter: Payer: PPO | Admitting: Occupational Therapy

## 2017-04-15 ENCOUNTER — Ambulatory Visit: Payer: PPO | Admitting: Physical Therapy

## 2017-04-16 ENCOUNTER — Ambulatory Visit: Payer: PPO | Admitting: Physical Therapy

## 2017-04-16 ENCOUNTER — Ambulatory Visit: Payer: PPO | Admitting: Occupational Therapy

## 2017-04-16 DIAGNOSIS — M6281 Muscle weakness (generalized): Secondary | ICD-10-CM

## 2017-04-16 DIAGNOSIS — R29818 Other symptoms and signs involving the nervous system: Secondary | ICD-10-CM

## 2017-04-16 DIAGNOSIS — R278 Other lack of coordination: Secondary | ICD-10-CM

## 2017-04-16 DIAGNOSIS — R208 Other disturbances of skin sensation: Secondary | ICD-10-CM

## 2017-04-16 DIAGNOSIS — R2681 Unsteadiness on feet: Secondary | ICD-10-CM

## 2017-04-16 DIAGNOSIS — R2689 Other abnormalities of gait and mobility: Secondary | ICD-10-CM

## 2017-04-16 NOTE — Therapy (Signed)
[facility] [address] [place], [name], [address] Phone: [phone]   Fax:  [phone]  Occupational Therapy Treatment  Patient Details  Name: Katelyn Lamb MRN: [id number] Date of Birth: [birth year] Referring Provider: Carlos Levering PA-C  Encounter Date: 04/16/2017      OT End of Session - 04/16/17 1638    Visit Number 14   Number of Visits 24   Date for OT Re-Evaluation 05/11/17   Authorization Type [employer] - G code required   Authorization Time Period 4th visit from renewal period   Authorization - Visit Number 14   Authorization - Number of Visits 19   OT Start Time 1535   OT Stop Time 1615   OT Time Calculation (min) 40 min      Past Medical History:  Diagnosis Date  . Anxiety   . Back pain   . Gait abnormality 11/14/2016  . Myelitis due to herpes simplex [facility])     Past Surgical History:  Procedure Laterality Date  . ABDOMINAL HYSTERECTOMY    . BLADDER REPAIR    . CESAREAN SECTION    . TUBAL LIGATION      There were no vitals filed for this visit.      Subjective Assessment - 04/16/17 1551    Subjective  Pt reports she's having a good day   Pertinent History HSV2 encephalitis and myelitis, anxiety,    Limitations no driving, fall risk   Patient Stated Goals to be normal and get stronger   Currently in Pain? No/denies            treatment: Pt ambulated from waiting room today with therapist demonstrating significant balance deficits requiring min handheld assist. Therapist recommended use of a walker and pt practiced navigating gym with rolling walker and minguard-min A. Supine lower trunk rotation, followed by chest press with ball and triceps extension with ball, min facilitation. Seated chest press with ball x 10 reps.  Pt practiced doffing/ donning shoes using long handed shore horn and shoe buttons, pt required extra time to fasten shoe button. Pt to consider for  home.                  OT Education - 04/16/17 1659    Education provided Yes   Education Details recommended pt uses walker with her husband providing min A   Person(s) Educated Patient;Spouse   Methods Explanation;Demonstration;Verbal cues   Comprehension Verbalized understanding          OT Short Term Goals - 04/16/17 1641      OT SHORT TERM GOAL #1   Title Pt and husband will be mod I with home activities program - 01/05/17   Status Achieved     OT SHORT TERM GOAL #2   Title Pt to feed self 75% of the time AE prn   Status Achieved     OT SHORT TERM GOAL #3   Title Pt will require min a for dressing    Status Achieved  per pt and husband pt only needs assist with buttons/tying     OT SHORT TERM GOAL #4   Title Pt will complete sit to stand with close superivison and vc's for safety as prep for more independent functional mobility   Status Achieved     OT SHORT TERM GOAL #5   Title Improve bilateral UE function as evidenced by performing Box & Blocks with at least an 8 block improvement   Baseline  eval: Rt = 17, Lt = 10    Status Not Met  r= 23, L = 12     OT SHORT TERM GOAL #6   Title Pt will be demonstrate ability for simple activity in standing without UE support with minimal dynamic standing balance with close supervision.     Status Achieved     OT SHORT TERM GOAL #7   Title Pt will demonstrate ability for sustained attention with no more than min vc's during functional familiar basic activity   Status Achieved     OT SHORT TERM GOAL #8   Title Pt will perform bathing and dressing with supervison only - all new STG's below due 04/11/17   Time 4   Period Weeks   Status On-going     OT SHORT TERM GOAL  #9   TITLE Pt will perfom snack/ sandwich prep with supervision and min v.c.   Time 4   Period Weeks   Status On-going     OT SHORT TERM GOAL  #10   TITLE Pt will demonstrate improved bilateral UE functional use as evidenced by increasing box/  blocks score by 5  blocks   Baseline RUE 29 blocks, LUE 11 blocks   Time 4   Period Weeks   Status On-going     OT SHORT TERM GOAL  #11   TITLE Pt will increase bilateral grip strength by 5 lbs for increased ease with ADLs/IADLs.   Baseline RUE 17 lbs, LUE 8 lbs   Time 4   Period Weeks   Status On-going           OT Long Term Goals - 03/21/17 1703      OT LONG TERM GOAL #1   Title Pt and husband Independent with updated HEP - all ongoing and new LTG's due 05/11/17   Time 8   Period Weeks   Status On-going     OT LONG TERM GOAL #2   Title Pt will demonstrate ability to zip and button with vc's only   Time 8   Period Weeks   Status On-going     OT LONG TERM GOAL #3   Title Pt to perform simple snack/sandwich prep S level ( now set as STG)   Baseline now set as STG   Period Weeks   Status Not Met     OT LONG TERM GOAL #4   Title Pt to demo sufficient UE strength as demo by folding laundry for 10 minutes w/o rest in standing   Time 8   Period Weeks   Status On-going     OT LONG TERM GOAL #5   Title Pt to improve grip strength by 10 lbs or greater from initial eval   Status Not Met     OT LONG TERM GOAL #6   Title Pt will require no more than supervision for dressing    Status Not Met     OT LONG TERM GOAL #7   Title Pt will be supervision for toilet transfers and hygeine   Time 8   Period Weeks   Status On-going     OT LONG TERM GOAL #8   Title Pt will be supervision for shower transfers   Time 8   Status On-going     OT LONG TERM GOAL  #9   Baseline Pt will demonstrate ability for sustained/selective attention for familar functional task with no more than 2 vc's.    Time 8   Period  Weeks   Status On-going     OT LONG TERM GOAL  #10   TITLE Pt will perform simple sandwich prep/ basic home management at a modified independent level.   Time 8   Period Weeks   Status New               Plan - 04/16/17 1641    Clinical Impression Statement Pt  is progressing towards goals. Pt practiced walking with a walker today with minguard assist. Pt demonstrates improved safety, balance and ability to grasp walker. Pt and husband were encouraged to have pt use a walker with her husband providing min A for safety.   Rehab Potential Fair   Current Impairments/barriers affecting progress: apraxia, cognitive deficits, ataxia, severe sensory impairment     OT Frequency 2x / week   OT Duration 8 weeks   OT Treatment/Interventions Self-care/ADL training;Moist Heat;Fluidtherapy;DME and/or AE instruction;Patient/family education;Splinting;Therapeutic activities;Neuromuscular education;Functional Mobility Training;Passive range of motion;Cognitive remediation/compensation;Visual/perceptual remediation/compensation;Manual Therapy;Therapeutic exercise;Balance training   Plan check short term goals, ADL strategies   Consulted and Agree with Plan of Care Patient;Family member/caregiver   Family Member Consulted husband      Patient will benefit from skilled therapeutic intervention in order to improve the following deficits and impairments:  Abnormal gait, Decreased activity tolerance, Decreased balance, Decreased cognition, Decreased coordination, Decreased safety awareness, Decreased mobility, Decreased strength, Difficulty walking, Impaired UE functional use, Impaired tone, Impaired sensation, Impaired vision/preception, Pain  Visit Diagnosis: Muscle weakness (generalized)  Other lack of coordination  Unsteadiness on feet  Other abnormalities of gait and mobility  Other symptoms and signs involving the nervous system  Other disturbances of skin sensation    Problem List Patient Active Problem List   Diagnosis Date Noted  . Medication monitoring encounter 01/08/2017  . Gait abnormality 11/14/2016  . Hypotension due to drugs   . Urinary retention   . Anxiety about health   . Reactive depression   . Ataxia   . Abdominal spasms   .  Constipation due to pain medication   . Acute lower UTI   . Dysuria   . Acute deep vein thrombosis (DVT) of popliteal vein of left lower extremity ([place])   . Incomplete paraplegia ([place])   . Acute blood loss anemia   . Neurogenic bladder   . Neuropathic pain   . Muscle spasm   . Gastroesophageal reflux disease   . Slow transit constipation   . Thrombocytopenia ([place]) 10/03/2016  . Abnormal MRI, spinal cord   . Encephalomyelitis   . Numbness   . Intractable back pain 09/20/2016  . Numbness of left lower extremity 09/20/2016  . Hyponatremia 09/20/2016  . Herpes zoster without complication 14/70/9295    RINE,KATHRYN 04/16/2017, 5:00 PM Theone Murdoch, OTR/L [phone] Phone: [phone] 5:00 PM 04/16/17 [facility] [address] [place], [name], [address] Phone: [phone]   Fax:  [phone]  Name: Katelyn Lamb MRN: [id number] Date of Birth: Apr 27, 1946

## 2017-04-22 ENCOUNTER — Ambulatory Visit: Payer: PPO | Admitting: Physical Therapy

## 2017-04-22 ENCOUNTER — Ambulatory Visit: Payer: PPO | Admitting: Occupational Therapy

## 2017-04-23 DIAGNOSIS — I82502 Chronic embolism and thrombosis of unspecified deep veins of left lower extremity: Secondary | ICD-10-CM | POA: Diagnosis not present

## 2017-04-23 DIAGNOSIS — A09 Infectious gastroenteritis and colitis, unspecified: Secondary | ICD-10-CM | POA: Diagnosis not present

## 2017-04-23 DIAGNOSIS — H6123 Impacted cerumen, bilateral: Secondary | ICD-10-CM | POA: Diagnosis not present

## 2017-04-24 ENCOUNTER — Other Ambulatory Visit: Payer: Self-pay | Admitting: Family Medicine

## 2017-04-24 DIAGNOSIS — M7502 Adhesive capsulitis of left shoulder: Secondary | ICD-10-CM | POA: Diagnosis not present

## 2017-04-24 DIAGNOSIS — A09 Infectious gastroenteritis and colitis, unspecified: Secondary | ICD-10-CM | POA: Diagnosis not present

## 2017-04-24 DIAGNOSIS — I82502 Chronic embolism and thrombosis of unspecified deep veins of left lower extremity: Secondary | ICD-10-CM

## 2017-04-25 ENCOUNTER — Encounter: Payer: PPO | Admitting: Occupational Therapy

## 2017-04-25 ENCOUNTER — Ambulatory Visit: Payer: PPO | Admitting: Physical Therapy

## 2017-04-26 ENCOUNTER — Emergency Department (HOSPITAL_COMMUNITY): Payer: PPO

## 2017-04-26 ENCOUNTER — Telehealth: Payer: Self-pay | Admitting: Neurology

## 2017-04-26 ENCOUNTER — Emergency Department (HOSPITAL_COMMUNITY)
Admission: EM | Admit: 2017-04-26 | Discharge: 2017-04-27 | Disposition: A | Discharge status: Home / Self Care | Payer: PPO | Attending: Emergency Medicine | Admitting: Emergency Medicine

## 2017-04-26 ENCOUNTER — Encounter (HOSPITAL_COMMUNITY): Payer: Self-pay | Admitting: Emergency Medicine

## 2017-04-26 DIAGNOSIS — M25552 Pain in left hip: Secondary | ICD-10-CM | POA: Diagnosis not present

## 2017-04-26 DIAGNOSIS — Y998 Other external cause status: Secondary | ICD-10-CM | POA: Insufficient documentation

## 2017-04-26 DIAGNOSIS — Y939 Activity, unspecified: Secondary | ICD-10-CM | POA: Diagnosis not present

## 2017-04-26 DIAGNOSIS — T148XXA Other injury of unspecified body region, initial encounter: Secondary | ICD-10-CM | POA: Diagnosis not present

## 2017-04-26 DIAGNOSIS — W010XXA Fall on same level from slipping, tripping and stumbling without subsequent striking against object, initial encounter: Secondary | ICD-10-CM | POA: Diagnosis not present

## 2017-04-26 DIAGNOSIS — Z79899 Other long term (current) drug therapy: Secondary | ICD-10-CM | POA: Insufficient documentation

## 2017-04-26 DIAGNOSIS — S79912A Unspecified injury of left hip, initial encounter: Secondary | ICD-10-CM | POA: Diagnosis not present

## 2017-04-26 DIAGNOSIS — Y92000 Kitchen of unspecified non-institutional (private) residence as  the place of occurrence of the external cause: Secondary | ICD-10-CM | POA: Diagnosis not present

## 2017-04-26 DIAGNOSIS — Z86718 Personal history of other venous thrombosis and embolism: Secondary | ICD-10-CM | POA: Insufficient documentation

## 2017-04-26 DIAGNOSIS — S32502A Unspecified fracture of left pubis, initial encounter for closed fracture: Secondary | ICD-10-CM

## 2017-04-26 DIAGNOSIS — S3210XA Unspecified fracture of sacrum, initial encounter for closed fracture: Secondary | ICD-10-CM

## 2017-04-26 DIAGNOSIS — W19XXXA Unspecified fall, initial encounter: Secondary | ICD-10-CM

## 2017-04-26 MED ORDER — DULOXETINE HCL 60 MG PO CPEP
60.0000 mg | ORAL_CAPSULE | Freq: Two times a day (BID) | ORAL | Status: DC
  Administered 2017-04-26 – 2017-04-27 (×2): 60 mg via ORAL
  Filled 2017-04-26 (×2): qty 1

## 2017-04-26 MED ORDER — PANTOPRAZOLE SODIUM 40 MG PO TBEC
40.0000 mg | DELAYED_RELEASE_TABLET | Freq: Every day | ORAL | Status: DC
  Administered 2017-04-27: 40 mg via ORAL
  Filled 2017-04-26 (×2): qty 1

## 2017-04-26 MED ORDER — PREGABALIN 50 MG PO CAPS
50.0000 mg | ORAL_CAPSULE | Freq: Two times a day (BID) | ORAL | Status: DC
  Administered 2017-04-26 – 2017-04-27 (×2): 50 mg via ORAL
  Filled 2017-04-26 (×2): qty 1

## 2017-04-26 MED ORDER — VANCOMYCIN 50 MG/ML ORAL SOLUTION
125.0000 mg | Freq: Four times a day (QID) | ORAL | Status: DC
  Administered 2017-04-26 – 2017-04-27 (×3): 125 mg via ORAL
  Filled 2017-04-26 (×5): qty 2.5

## 2017-04-26 MED ORDER — OXYCODONE-ACETAMINOPHEN 5-325 MG PO TABS: 1.0000 | ORAL_TABLET | Freq: Three times a day (TID) | ORAL | 0 refills | Status: DC | PRN

## 2017-04-26 MED ORDER — VANCOMYCIN HCL 125 MG PO CAPS
125.0000 mg | ORAL_CAPSULE | Freq: Four times a day (QID) | ORAL | Status: DC
  Filled 2017-04-26 (×2): qty 1

## 2017-04-26 MED ORDER — OXYCODONE-ACETAMINOPHEN 5-325 MG PO TABS
1.0000 | ORAL_TABLET | Freq: Once | ORAL | Status: AC
  Administered 2017-04-26: 1 via ORAL
  Filled 2017-04-26: qty 1

## 2017-04-26 MED ORDER — HYDROCODONE-ACETAMINOPHEN 5-325 MG PO TABS
2.0000 | ORAL_TABLET | Freq: Once | ORAL | Status: AC
  Administered 2017-04-26: 2 via ORAL
  Filled 2017-04-26: qty 2

## 2017-04-26 MED ORDER — DIPHENHYDRAMINE HCL 25 MG PO CAPS
25.0000 mg | ORAL_CAPSULE | Freq: Once | ORAL | Status: AC
  Administered 2017-04-26: 25 mg via ORAL
  Filled 2017-04-26: qty 1

## 2017-04-26 MED ORDER — DIAZEPAM 5 MG PO TABS
5.0000 mg | ORAL_TABLET | Freq: Two times a day (BID) | ORAL | Status: DC
  Administered 2017-04-26 – 2017-04-27 (×2): 5 mg via ORAL
  Filled 2017-04-26 (×2): qty 1

## 2017-04-26 MED ORDER — ALPRAZOLAM 0.5 MG PO TABS
1.0000 mg | ORAL_TABLET | Freq: Every evening | ORAL | Status: DC | PRN
  Filled 2017-04-26: qty 2

## 2017-04-26 NOTE — ED Triage Notes (Signed)
Patient from home after slipping and falling on floor.  No LOC, complains of left hip pain with no obvious deformity.  Only complains of pain with movement.  Alert and oriented at this time.  Also complains of left shoulder pain from a previous fall.  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

## 2017-04-26 NOTE — ED Notes (Signed)
Pt requesting hydrocodone for pain stating this is what she takes at home for pain and the percocet makes her itchy. EDP made aware.

## 2017-04-26 NOTE — ED Notes (Signed)
Social work called to report that they are working on placement and will notify husband about placement.

## 2017-04-26 NOTE — Discharge Planning (Signed)
Weightbearing as tolerated!

## 2017-04-26 NOTE — ED Notes (Signed)
Offered to order patient a meal tray. Family reports they will get patient something to eat.

## 2017-04-26 NOTE — Care Management (Signed)
ED CM faxed [facility] referral and attempted to contact on call nurse to arrange start of care for tomorrow. Patient's son was given information concerning private duty care. CSW and CM will follow up to secure safe discharge tomorrow.

## 2017-04-26 NOTE — ED Notes (Signed)
ED Provider at bedside. 

## 2017-04-26 NOTE — Evaluation (Signed)
Physical Therapy Evaluation Patient Details Name: Katelyn Lamb MRN: [id number] DOB: 1946/04/23 Today's Date: 04/26/2017   History of Present Illness  Pt is a 71 y/o female presenting to ED after a fall. Imaging revealed a L sacral ala fracture and nondisplaced L inferior pubic ramus fx. PMH includes chronic back pain, gait abnormality and abnormal MRI. Pt reports baseline decreased sensation in hands and reports it was caused by infection that went to brain and spinal cord.   Clinical Impression  Pt presenting to ED with problem above and deficits below. PTA, pt was independent with functional mobility, however, was going to OP PT secondary to decreased balance. Upon eval, pt limited by pain, weakness, and decreased balance. Required mod A +2 for mobility this session and could only tolerate a few side steps at EOB. Pt's husband concerned about taking pt home and reports he will be unable to assist as needed. Discussed SNF to increase independence and safety with functional mobility, and pt and family agreeable. Will continue to follow to maximize functional mobility independence and safety.     Follow Up Recommendations SNF    Equipment Recommendations  Wheelchair (measurements PT);Wheelchair cushion (measurements PT)    Recommendations for Other Services OT consult     Precautions / Restrictions Precautions Precautions: Fall Precaution Comments: Pt presenting secondary to fall at home  Restrictions Weight Bearing Restrictions: Yes LLE Weight Bearing: Weight bearing as tolerated      Mobility  Bed Mobility Overal bed mobility: Needs Assistance Bed Mobility: Supine to Sit;Sit to Supine     Supine to sit: Mod assist Sit to supine: Mod assist;+2 for physical assistance   General bed mobility comments: Mod A to assist with LLE movement and trunk elevation to transition to sitting. Mod +2 for return to supine for trunk descent and LE lift assist.   Transfers Overall transfer level:  Needs assistance Equipment used: 2 person hand held assist Transfers: Sit to/from Stand Sit to Stand: Min assist;+2 safety/equipment;From elevated surface         General transfer comment:  Min A +2 from elevated surface. Increased time and effort required to stand secondary to pain.   Ambulation/Gait Ambulation/Gait assistance: Mod assist;+2 physical assistance Ambulation Distance (Feet): 1 Feet Assistive device: 2 person hand held assist Gait Pattern/deviations: Step-to pattern;Antalgic;Shuffle Gait velocity: Decreased Gait velocity interpretation: Below normal speed for age/gender General Gait Details: Able to take a few side steps along EOB, however, required mod A +2 for balance. Pt unable to lift LLE off the floor and had to shuffle in order to side step.   Stairs            Wheelchair Mobility    Modified Rankin (Stroke Patients Only)       Balance Overall balance assessment: Needs assistance Sitting-balance support: Bilateral upper extremity supported;Feet supported Sitting balance-Leahy Scale: Fair     Standing balance support: Bilateral upper extremity supported;During functional activity Standing balance-Leahy Scale: Poor Standing balance comment: Reliant on UE support for balance.                              Pertinent Vitals/Pain Pain Assessment: 0-10 Pain Score: 10-Worst pain ever Pain Location: L hip with movement Pain Descriptors / Indicators: Aching;Grimacing Pain Intervention(s): Limited activity within patient's tolerance;Monitored during session;Repositioned    Home Living Family/patient expects to be discharged to:: Private residence Living Arrangements: Spouse/significant other Available Help at Discharge: Family;Available 24 hours/day Type  of Home: House Home Access: Stairs to enter Entrance Stairs-Rails: None Entrance Stairs-Number of Steps: 2 Home Layout: Two level;Able to live on main level with bedroom/bathroom Home  Equipment: Bedside commode;Walker - 2 wheels;Adaptive equipment      Prior Function Level of Independence: Independent         Comments: Reports she was independent with functional mobility. Reports she could not use walker as hands would slip off because she could not feel them.      Hand Dominance   Dominant Hand: Right    Extremity/Trunk Assessment   Upper Extremity Assessment Upper Extremity Assessment: LUE deficits/detail;RUE deficits/detail RUE Deficits / Details: Decreased grip strength noted; pt reports this is baseline.  RUE Sensation: decreased light touch;decreased proprioception LUE Deficits / Details: Decreased grip strength noted; pt reports this is baseline.  LUE Sensation: decreased light touch;decreased proprioception    Lower Extremity Assessment Lower Extremity Assessment: LLE deficits/detail;Generalized weakness LLE Deficits / Details: Gross weakness noted. Pt unable to lift foot off of floor secondary to pain.  LLE: Unable to fully assess due to pain       Communication   Communication: No difficulties  Cognition Arousal/Alertness: Awake/alert Behavior During Therapy: WFL for tasks assessed/performed Overall Cognitive Status: Within Functional Limits for tasks assessed                                        General Comments General comments (skin integrity, edema, etc.): Pt's husband present during session and concerned about taking pt home. Educated about SNF at d/c to increase independence and safety with functional mobility.     Exercises     Assessment/Plan    PT Assessment Patient needs continued PT services  PT Problem List Decreased range of motion;Decreased activity tolerance;Decreased balance;Decreased mobility;Decreased knowledge of use of DME;Decreased knowledge of precautions;Pain       PT Treatment Interventions DME instruction;Gait training;Functional mobility training;Therapeutic activities;Therapeutic  exercise;Balance training;Neuromuscular re-education;Patient/family education    PT Goals (Current goals can be found in the Care Plan section)  Acute Rehab PT Goals Patient Stated Goal: to decrease pain  PT Goal Formulation: With patient Time For Goal Achievement: 05/03/17 Potential to Achieve Goals: Good    Frequency Min 2X/week   Barriers to discharge        Co-evaluation               AM-PAC PT "6 Clicks" Daily Activity  Outcome Measure Difficulty turning over in bed (including adjusting bedclothes, sheets and blankets)?: Unable Difficulty moving from lying on back to sitting on the side of the bed? : Unable Difficulty sitting down on and standing up from a chair with arms (e.g., wheelchair, bedside commode, etc,.)?: Unable Help needed moving to and from a bed to chair (including a wheelchair)?: A Lot Help needed walking in hospital room?: A Lot Help needed climbing 3-5 steps with a railing? : Total 6 Click Score: 8    End of Session Equipment Utilized During Treatment: Gait belt Activity Tolerance: Patient limited by pain Patient left: in bed;with call bell/phone within reach;with family/visitor present Nurse Communication: Mobility status PT Visit Diagnosis: Other abnormalities of gait and mobility (R26.89);Pain Pain - Right/Left: Left Pain - part of body:  (pelvis )    Time: 4174-0814 PT Time Calculation (min) (ACUTE ONLY): 26 min   Charges:   PT Evaluation $PT Eval Moderate Complexity: 1 Mod PT Treatments $Therapeutic  Activity: 8-22 mins   PT G Codes:   PT G-Codes **NOT FOR INPATIENT CLASS** Functional Assessment Tool Used: AM-PAC 6 Clicks Basic Mobility;Clinical judgement Functional Limitation: Mobility: Walking and moving around Mobility: Walking and Moving Around Current Status (A8341): At least 80 percent but less than 100 percent impaired, limited or restricted Mobility: Walking and Moving Around Goal Status [phone]): At least 20 percent but less  than 40 percent impaired, limited or restricted    Leighton Ruff, PT, DPT  Acute Rehabilitation Services  Pager: [phone] 04/26/2017, 3:32 PM

## 2017-04-26 NOTE — Telephone Encounter (Signed)
Patients husband called office in reference to pregabalin (LYRICA) 50 MG capsule.  States patient has been taking 1 in am and 1 in pm.  Patient has not had any increase in pain. Husband states patient takes diazepam (VALIUM) 5 MG tablet 1 in am and pm he would like to know if patient needs to continue.    **FYI** Husband would like to know patient fell this morning while in the kitchen and patient is now at [facility] ED.

## 2017-04-26 NOTE — Progress Notes (Signed)
CSW received a call from [place] Pt has been accepted by: [facility] Number for report is: [phone] Pt's unit/room/bed number will be: [id number] Accepting physician: SNF MD  Pt can arrive ASAP on 04/26/17  CSW will update RN and EDP.  Alphonse Guild. Carrie Usery, Reed Pandy, CSI Clinical Social Worker Ph: [phone]

## 2017-04-26 NOTE — Discharge Planning (Signed)
[facility] consulted regarding home health services.  Pt goes to [facility] for PT.  Pt can not have home health AND OP Rehab.  EDCM spoke with pt and husband at bedside; both voiced concerns of returning home in this state.  [facility] consulted PT Rehab to evaluate, treat and offer recommendations for disposition. EDCM will await the direction of PT eval.

## 2017-04-26 NOTE — Telephone Encounter (Signed)
I called the husband, left a message. A will cut back on the Lyrica taking one at night for 2 weeks and then stop. Cutting back on diazepam is also reasonable, going to 1 tablet a day for 2 weeks and then stop.  The patient fell and fractured her pelvis, she has significant risk for falls.   CT pelvis 04/26/17:  IMPRESSION: 1. Small nondisplaced fracture of the left sacral ala. 2. Small nondisplaced fracture of the left inferior pubic ramus. 3. Small linear bony fragment adjacent to the anterior left greater trochanter, which may represent a small avulsion fracture at the site of the gluteus minimus tendon insertion.

## 2017-04-26 NOTE — ED Notes (Signed)
Notifed EDP of pt need for PM medication.

## 2017-04-26 NOTE — NC FL2 (Signed)
[place] LEVEL OF CARE SCREENING TOOL     IDENTIFICATION  Patient Name: Katelyn Lamb Birthdate: September 18, 1945 Sex: female Admission Date (Current Location): 04/26/2017  [facility] and [place] Number:  Herbalist and Address:  [facility] [place], [place] [address], [place], [place] [address]      Provider Number: [id number]  Attending Physician Name and Address:  Merrily Pew, MD  Relative Name and Phone Number:       Current Level of Care: Hospital Recommended Level of Care: [place] Prior Approval Number:    Date Approved/Denied:   PASRR Number:   [id number]  Discharge Plan: SNF    Current Diagnoses: Patient Active Problem List   Diagnosis Date Noted  . Medication monitoring encounter 01/08/2017  . Gait abnormality 11/14/2016  . Hypotension due to drugs   . Urinary retention   . Anxiety about health   . Reactive depression   . Ataxia   . Abdominal spasms   . Constipation due to pain medication   . Acute lower UTI   . Dysuria   . Acute deep vein thrombosis (DVT) of popliteal vein of left lower extremity ([place])   . Incomplete paraplegia ([place])   . Acute blood loss anemia   . Neurogenic bladder   . Neuropathic pain   . Muscle spasm   . Gastroesophageal reflux disease   . Slow transit constipation   . Thrombocytopenia ([place]) 10/03/2016  . Abnormal MRI, spinal cord   . Encephalomyelitis   . Numbness   . Intractable back pain 09/20/2016  . Numbness of left lower extremity 09/20/2016  . Hyponatremia 09/20/2016  . Herpes zoster without complication 22/97/9892    Orientation RESPIRATION BLADDER Height & Weight     Self, Time, Situation  Normal Continent Weight:   Height:     BEHAVIORAL SYMPTOMS/MOOD NEUROLOGICAL BOWEL NUTRITION STATUS      Continent Diet (please see discharge summary. )  AMBULATORY STATUS COMMUNICATION OF NEEDS Skin   Extensive Assist Verbally Normal                       Personal  Care Assistance Level of Assistance  Bathing, Feeding, Dressing Bathing Assistance: Maximum assistance Feeding assistance: Limited assistance Dressing Assistance: Maximum assistance     Functional Limitations Info  Sight, Hearing, Speech Sight Info: Adequate Hearing Info: Adequate Speech Info: Adequate    SPECIAL CARE FACTORS FREQUENCY  PT (By licensed PT), OT (By licensed OT)     PT Frequency: 5 times a week.               Contractures Contractures Info: Not present    Additional Factors Info  Code Status, Allergies Code Status Info: Prior Allergies Info: Demerol Meperidine, Amoxicillin-pot Clavulanate, Penicillins           Current Medications (04/26/2017):  This is the current hospital active medication list No current facility-administered medications for this encounter.    Current Outpatient Prescriptions  Medication Sig Dispense Refill  . acyclovir (ZOVIRAX) 400 MG tablet Take 1 tablet (400 mg total) by mouth 2 (two) times daily. 60 tablet 5  . ALPRAZolam (XANAX) 1 MG tablet Take 1 mg by mouth as needed.  0  . calcium carbonate (OS-CAL) 600 MG TABS tablet Take 600 mg by mouth 2 (two) times daily with a meal.    . cholecalciferol (VITAMIN D) 1000 units tablet Take 1,000 Units by mouth daily.    Marland Kitchen  diazepam (VALIUM) 5 MG tablet Take 5 mg by mouth 2 (two) times daily.  0  . DULoxetine (CYMBALTA) 60 MG capsule Take 1 capsule (60 mg total) by mouth 2 (two) times daily. 60 capsule 3  . HYDROcodone-acetaminophen (NORCO) 10-325 MG tablet Take 1-2 tablets by mouth every 4 (four) hours as needed for moderate pain. 30 tablet 0  . omega-3 acid ethyl esters (LOVAZA) 1 g capsule Take 1 capsule (1 g total) by mouth 2 (two) times daily. 60 capsule 0  . pantoprazole (PROTONIX) 40 MG tablet Take 1 tablet (40 mg total) by mouth daily. 30 tablet 1  . polyethylene glycol (MIRALAX / GLYCOLAX) packet Take 17 g by mouth daily. (Patient taking differently: Take 17 g by mouth daily as  needed. ) 14 each 0  . pregabalin (LYRICA) 50 MG capsule One capsule in the morning and 2 in the evening 90 capsule 3  . rivaroxaban (XARELTO) 20 MG TABS tablet 20 mg tablet daily to begin 10/31/2016 30 tablet 1     Discharge Medications: Please see discharge summary for a list of discharge medications.  Relevant Imaging Results:  Relevant Lab Results:   Additional Information SSN- [id number]  Wetzel Bjornstad, LCSWA

## 2017-04-26 NOTE — ED Notes (Signed)
Patient used wheelchair to go to the bathroom, 2 staff assist to pivot to the toilet and back to the wheelchair. Patient was a total assist when transferring and was not able to do more than 10% of the effort on her own. Walker and wheelchair used for transfer assistance.

## 2017-04-26 NOTE — Progress Notes (Addendum)
CSW received a call from the [facility] ED [facility] who stated she felt it was not safe for the pt to be discharged this late at night to either home due to the pt's inability to walk or to a facility that is not their preferred facility and that the [place] made the decision to allow the pt to remain overnight to make a more preferable choice in the morning to either D/C home or to a different facility.  Alphonse Guild. Barby Colvard, LCSWA, Deon Pilling, CSI Clinical Social Worker Ph: [phone]

## 2017-04-26 NOTE — Progress Notes (Addendum)
2nd shift ED CSW received handoff from daytime ED CSW stating EDP at [facility] Ed is requiring placement for the pt.  Daytime ED CSW completed FL-2 and assessment and faxed out referrals to facilities via the hub  CSW asked that pt and pt's husband be made aware that ED CSW's were attempting placement but due to interests of time the ED cannot hold a pt for multiple placement choices once placement has been found. and provided pt's husband with names of these facilities.    CSW was informed by daytime CSW that pt's husband stated the EDP had told the pt's husband that the pt can be held in the ED until the pt's husband has toured the facilities of their choice and made a better-informed decision on placement.  CSW spoke with the EDP who stated he did NOT tell the pt's husband this and that this was not true.   Daytime ED CSW informed family.    5:12 PM CSW informed family placement was attempted at multiple facilities but pt had only been accepted for review by [facility], [facility] and [facility]'s. CSW also called [facility] but did not receive a call in return.  CSW spoke to [facility] but did not get a return call.  CSW initiated admittance into [facility] after [facility] accepted and initiated insurance auth from [employer] called family and explained this was only accepting facility. Pt's husband agreed to call [facility] and then via RN stated his son had hear bad reviews on line and now refused to D/C to [facility].  Pt was informed by [facility] pt was accepted and informed by [employer] that pt was authorized and that auth # was [id number] and room number is 128A.  CSW staffed case with RN, EDP and Asst CSW Director and it was agreed CSW had found a safe placement for the pt and it was pt's right to accept placement or return home with Home Health.  CSW spoke with pt's family and informed him of options and then explained it was his option  to take pt to [facility] and then attempt placement to other facilities from [place].   CSW updated EDP  Pt's son entered the conversation by phone and CSW reiterated options, pt's son stated pt was not going to allow D/C and stated, "She is not going anywhere and I want that on the record", son then stated he wanted to speak to the CM regarding Home Health options and to the EDP.   CSW updated CM and EDP.  EDP stated family wishes to remain overnight in the ED to have more choices available to them due to their reading bad reviews about the facility where placement was found.  CSW was unable to reach asst director again, but decision has already been made per Asst Director, and pt must accept D/C or return home due to pt not meeting inpatient criteria. CWS updated EDP and will speak to family.  CSW spoke to family and family states CSW is incorrect that family can stay and asked CSW to speak to the CM.  CM states daytime EDP before leaving for the day stated to family they can stay for 24 hours to find more suitable placement in CM's, RN's and CN's and family's presence.  CSW staffed with Asst Director and confirmed D/C order was signed and pt is D/C'd.    CSW Asst Director stated CSW must call AC and if needed Medical Director, to D/C pt due to  D/C order being signed.    CSW asked nighttime EDP to please create hard copies of all scripts to be sent to [place] stated may take some time. EDP confirmed D/C orders have been signed. CSW will continue to follow.  7:02 PM CSW at direction of the CSW Asst Director contacted the [facility] and updated AC on situation.  CSW awaiting return call from from [facility].       Alphonse Guild. Hazelee Harbold, LCSWA, Deon Pilling, CSI Clinical Social Worker Ph: [phone]

## 2017-04-26 NOTE — Progress Notes (Signed)
CSW spoke with pt and pt's husband at bedside. CSW was informed that doctor is not sending pt home due to pt not being to care for self properly. CSW was informed that husband is unable to care for pt as pt is requiring more help. CSW [facility]ed [facility] to SN[facility] and is awaiting to hear which facility can met pt's need. CSW was informed by evening CSW to inform family that pt would potentially have to take the first bed available only because ED staff cant hold pt's once they have placement.    CSW updated evening CSW on status of everything at this time. Per nurse doctor is holding pt in Pod F for placement. Evening  CSW is to update pt and family of bed options once available.   Virgie Dad Milena Liggett, MSW, [place] [facility] Clinical Social Worker [phone]

## 2017-04-26 NOTE — ED Notes (Signed)
Attempted to call social work in regards to patient. No response at this time.

## 2017-04-26 NOTE — Care Management (Addendum)
Dr. Dayna Barker and ED CM met with patient and family at bedside Patient and husband reported that they spoke with [facility] ED CSW and was told that they had to make the decision to accept bed at [facility] or be discharged with [facility] services. Family states, that they may opt to go home but need to get a plan in-place to have family and Private duty in place, for a safe discharge. ED CM discussed [place] services patient and family already familiar patient has had [facility] in the past. Offered choice. selected [facility]. Referral faxed in to the after service. Dr. Dayna Barker spoke with family concerning not discharging her until safe discharge plan is inplace tomorrow.  CM will provide patient and family with [facility] and Private duty resources. CM updated [facility] ED CSW. Dr. Dayna Barker updated the EDP assuming care. CM will continue to follow.Marland Kitchen

## 2017-04-26 NOTE — Clinical Social Work Note (Signed)
Clinical Social Work Assessment  Patient Details  Name: Katelyn Lamb MRN: [id number] Date of Birth: 10-26-1945  Date of referral:  04/19/17               Reason for consult:  Facility Placement                Permission sought to share information with:    Permission granted to share information::     Name::        Agency::     Relationship::     Contact Information:     Housing/Transportation Living arrangements for the past 2 months:  Mobile Home (with husband) Source of Information:  Patient Patient Interpreter Needed:    Criminal Activity/Legal Involvement Pertinent to Current Situation/Hospitalization:  No - Comment as needed Significant Relationships:  Adult Children, Spouse Lives with:  Spouse Annie Main) Do you feel safe going back to the place where you live?  Yes Need for family participation in patient care:  Yes (Comment)  Care giving concerns:  Concerns at this time include being able to care for pt as needed.    Social Worker assessment / plan:  Pt is from home with husband. Supports for pt include husband and other family members at this time. Pt is being reccommended for SNF and is agreeable to this plan of care at this time.   Employment status:  Retired Forensic scientist:  Managed Care PT Recommendations:  [place] / Referral to community resources:  [place]  Patient/Family's Response to care:  Pt is understanding and agreeable tp plan of care at this time.   Patient/Family's Understanding of and Emotional Response to Diagnosis, Current Treatment, and Prognosis:  No further questions or concerns have been presented at this time.   Emotional Assessment Appearance:  Appears stated age Attitude/Demeanor/Rapport:    Affect (typically observed):  Accepting, Adaptable Orientation:  Oriented to Self, Oriented to Place, Oriented to  Time, Oriented to Situation Alcohol / Substance use:  Not Applicable Psych  involvement (Current and /or in the community):  No (Comment)  Discharge Needs  Concerns to be addressed:  No discharge needs identified Readmission within the last 30 days:  No Current discharge risk:  None Barriers to Discharge:  No Barriers Identified   Wetzel Bjornstad, [place] 04/26/2017, 2:59 PM

## 2017-04-26 NOTE — ED Provider Notes (Addendum)
[facility] DEPT Provider Note   CSN: [id number] Arrival date & time: 04/26/17  1287     History   Chief Complaint Chief Complaint  Patient presents with  . Fall    HPI Katelyn Lamb is a 71 y.o. female.  Patient is a 71yo F with h/o encephalomyelitis d/t herpes simplex who presents to the ED with L hip pain from a fall. Patient states that she has been unsteady on her feet for the past few months with 1-2 falls per month, no recent increase in falls. She was in the kitchen this morning, slipped on the floor and fell on her L side, did not hit her head. Has L hip pain/back pain with movement. At baseline is able to ambulate on her own but needs assistance standing.      Past Medical History:  Diagnosis Date  . Anxiety   . Back pain   . Gait abnormality 11/14/2016  . Myelitis due to herpes simplex [facility])     Patient Active Problem List   Diagnosis Date Noted  . Medication monitoring encounter 01/08/2017  . Gait abnormality 11/14/2016  . Hypotension due to drugs   . Urinary retention   . Anxiety about health   . Reactive depression   . Ataxia   . Abdominal spasms   . Constipation due to pain medication   . Acute lower UTI   . Dysuria   . Acute deep vein thrombosis (DVT) of popliteal vein of left lower extremity ([place])   . Incomplete paraplegia ([place])   . Acute blood loss anemia   . Neurogenic bladder   . Neuropathic pain   . Muscle spasm   . Gastroesophageal reflux disease   . Slow transit constipation   . Thrombocytopenia ([place]) 10/03/2016  . Abnormal MRI, spinal cord   . Encephalomyelitis   . Numbness   . Intractable back pain 09/20/2016  . Numbness of left lower extremity 09/20/2016  . Hyponatremia 09/20/2016  . Herpes zoster without complication 86/76/7209    Past Surgical History:  Procedure Laterality Date  . ABDOMINAL HYSTERECTOMY    . BLADDER REPAIR    . CESAREAN SECTION    . TUBAL LIGATION      OB History    No data available       Home  Medications    Prior to Admission medications   Medication Sig Start Date End Date Taking? Authorizing Provider  acyclovir (ZOVIRAX) 400 MG tablet Take 1 tablet (400 mg total) by mouth 2 (two) times daily. 12/26/16   [name], MD  ALPRAZolam Duanne Moron) 1 MG tablet Take 1 mg by mouth as needed. 10/26/16   [provider]  calcium carbonate (OS-CAL) 600 MG TABS tablet Take 600 mg by mouth 2 (two) times daily with a meal.    [provider]  cholecalciferol (VITAMIN D) 1000 units tablet Take 1,000 Units by mouth daily.    [provider]  diazepam (VALIUM) 5 MG tablet Take 5 mg by mouth 2 (two) times daily. 10/26/16   [provider]  DULoxetine (CYMBALTA) 60 MG capsule Take 1 capsule (60 mg total) by mouth 2 (two) times daily. 02/08/17   [name], MD  HYDROcodone-acetaminophen [facility]) 10-325 MG tablet Take 1-2 tablets by mouth every 4 (four) hours as needed for moderate pain. 10/26/16   Angiulli, Lavon Paganini, PA-C  omega-3 acid ethyl esters (LOVAZA) 1 g capsule Take 1 capsule (1 g total) by mouth 2 (two) times daily. 10/26/16  Angiulli, Lavon Paganini, PA-C  pantoprazole (PROTONIX) 40 MG tablet Take 1 tablet (40 mg total) by mouth daily. 10/26/16   Angiulli, Lavon Paganini, PA-C  polyethylene glycol (MIRALAX / GLYCOLAX) packet Take 17 g by mouth daily. Patient taking differently: Take 17 g by mouth daily as needed.  10/26/16   Angiulli, Lavon Paganini, PA-C  pregabalin (LYRICA) 50 MG capsule One capsule in the morning and 2 in the evening 03/04/17   [name], MD  rivaroxaban Alveda Reasons) 20 MG TABS tablet 20 mg tablet daily to begin 10/31/2016 10/26/16   Angiulli, Lavon Paganini, PA-C    Family History Family History  Problem Relation Age of Onset  . Hypertension Mother     Social History Social History  Substance Use Topics  . Smoking status: Former Research scientist (life sciences)  . Smokeless tobacco: Never Used  . Alcohol use No     Allergies   Demerol [meperidine]; Amoxicillin-pot  clavulanate; and Penicillins   Review of Systems Review of Systems  Constitutional: Negative for chills and fever.  Eyes: Negative for visual disturbance.  Respiratory: Negative for chest tightness.   Cardiovascular: Negative for chest pain.  Gastrointestinal: Negative for abdominal pain, diarrhea and vomiting.  Musculoskeletal: Positive for arthralgias. Negative for neck pain and neck stiffness.  Neurological: Negative for light-headedness and headaches.  Psychiatric/Behavioral: Negative for confusion.     Physical Exam Updated Vital Signs BP 116/74 (BP Location: Right Arm)   Pulse 64   Resp 14   SpO2 100%   Physical Exam  Constitutional: She is oriented to person, place, and time. She appears well-developed and well-nourished. No distress.  HENT:  Head: Normocephalic and atraumatic.  Nose: Nose normal.  Eyes: Pupils are equal, round, and reactive to light. Conjunctivae and EOM are normal.  Neck: Normal range of motion. Neck supple.  Cardiovascular: Normal rate, regular rhythm, normal heart sounds and intact distal pulses.   No murmur heard. Pulmonary/Chest: Effort normal and breath sounds normal. No respiratory distress.  Abdominal: Soft. Bowel sounds are normal. There is no tenderness. There is no rebound and no guarding.  Musculoskeletal: She exhibits tenderness.  TTP over L sacrum and hip. Unable to bear weight upon standing. Reduced AROM d/t pain  Neurological: She is alert and oriented to person, place, and time. She displays normal reflexes. No sensory deficit. She exhibits normal muscle tone.  Skin: Skin is warm and dry. Capillary refill takes less than 2 seconds.  Psychiatric: She has a normal mood and affect.     ED Treatments / Results  Labs (all labs ordered are listed, but only abnormal results are displayed) Labs Reviewed - No data to display  EKG  EKG Interpretation None       Radiology Ct Pelvis Wo Contrast  Result Date: 04/26/2017 CLINICAL  DATA:  Left hip pain after fall. EXAM: CT PELVIS WITHOUT CONTRAST TECHNIQUE: Multidetector CT imaging of the pelvis was performed following the standard protocol without intravenous contrast. COMPARISON:  Pelvic x-rays from earlier today. CT abdomen pelvis dated November 22, 2015. FINDINGS: Bones: There are small nondisplaced fractures of the left sacral ala and left inferior pubic ramus. There is a small linear bony fragment adjacent to the anterior left greater trochanter, which may represent a small avulsion fracture. Mild degenerative changes of the bilateral hip joints. Severe lower lumbar facet arthropathy with unchanged 6 mm anterolisthesis of L4 on L5. Ligaments: Not applicable for exam/indication. Tendons/muscles: No focal abnormality. Soft tissues: Atherosclerotic vascular calcifications. The uterus is surgically absent. Visualized intrapelvic  contents are otherwise unremarkable. IMPRESSION: 1. Small nondisplaced fracture of the left sacral ala. 2. Small nondisplaced fracture of the left inferior pubic ramus. 3. Small linear bony fragment adjacent to the anterior left greater trochanter, which may represent a small avulsion fracture at the site of the gluteus minimus tendon insertion. Electronically Signed   By: Titus Dubin M.D.   On: 04/26/2017 11:15   Dg Hip Unilat With Pelvis 2-3 Views Left  Result Date: 04/26/2017 CLINICAL DATA:  Fall. EXAM: DG HIP (WITH OR WITHOUT PELVIS) 2-3V LEFT COMPARISON:  No recent prior. FINDINGS: Degenerative changes lumbar spine and both hips. No acute bony abnormality identified. No evidence of fracture or dislocation. Pelvic calcifications consistent phleboliths. IMPRESSION: Degenerative changes lumbar spine and both hips. No evidence of fracture or dislocation. Electronically Signed   By: Marcello Moores  Register   On: 04/26/2017 09:12    Procedures Procedures (including critical care time)  Medications Ordered in ED Medications  HYDROcodone-acetaminophen  (NORCO/VICODIN) 5-325 MG per tablet 2 tablet (2 tablets Oral Given 04/26/17 1224)  oxyCODONE-acetaminophen (PERCOCET/ROXICET) 5-325 MG per tablet 1 tablet (1 tablet Oral Given 04/26/17 1341)     Initial Impression / Assessment and Plan / ED Course  I have reviewed the triage vital signs and the nursing notes.  Pertinent labs & imaging results that were available during my care of the patient were reviewed by me and considered in my medical decision making (see chart for details).   Patient is a 71 yo F who presented to ED after a mechanical fall, found to have some small nondisplaced sacral fractures. Attempted to ambulate patient with good pain control in the ED but patient is unable to bear weight. SW consulted for SNF placement, if unable to find placement will likely need to stay for pain control given cannot perform ADLs. Signed out to Dr Tamera Punt follow up on placement.    Final Clinical Impressions(s) / ED Diagnoses   Final diagnoses:  Fall, initial encounter  Closed fracture of sacrum, unspecified portion of sacrum, initial encounter [facility])    New Prescriptions New Prescriptions   No medications on file      Bufford Lope, DO 04/26/17 1628    Mesner, Corene Cornea, MD 04/26/17 1657    Mesner, Corene Cornea, MD 04/26/17 [place], [place], DO 05/09/17 1147    Mesner, Corene Cornea, MD 05/10/17 [phone]

## 2017-04-26 NOTE — Discharge Instructions (Addendum)
Because you are on pain medications, I recommend taking HALF your normal Valium dose, or 2.5 mg instead of 5 mg. Talk to your doctor about this because long-term use of these medications is VERY dangerous.

## 2017-04-27 DIAGNOSIS — S3214XD Type 1 fracture of sacrum, subsequent encounter for fracture with routine healing: Secondary | ICD-10-CM | POA: Diagnosis not present

## 2017-04-27 DIAGNOSIS — S3210XA Unspecified fracture of sacrum, initial encounter for closed fracture: Secondary | ICD-10-CM | POA: Diagnosis not present

## 2017-04-27 DIAGNOSIS — R2689 Other abnormalities of gait and mobility: Secondary | ICD-10-CM | POA: Diagnosis not present

## 2017-04-27 DIAGNOSIS — K219 Gastro-esophageal reflux disease without esophagitis: Secondary | ICD-10-CM | POA: Diagnosis not present

## 2017-04-27 DIAGNOSIS — S79911A Unspecified injury of right hip, initial encounter: Secondary | ICD-10-CM | POA: Diagnosis not present

## 2017-04-27 DIAGNOSIS — G990 Autonomic neuropathy in diseases classified elsewhere: Secondary | ICD-10-CM | POA: Diagnosis not present

## 2017-04-27 DIAGNOSIS — M6281 Muscle weakness (generalized): Secondary | ICD-10-CM | POA: Diagnosis not present

## 2017-04-27 DIAGNOSIS — B0089 Other herpesviral infection: Secondary | ICD-10-CM | POA: Diagnosis not present

## 2017-04-27 DIAGNOSIS — E785 Hyperlipidemia, unspecified: Secondary | ICD-10-CM | POA: Diagnosis not present

## 2017-04-27 DIAGNOSIS — Z5189 Encounter for other specified aftercare: Secondary | ICD-10-CM | POA: Diagnosis not present

## 2017-04-27 DIAGNOSIS — S92153A Displaced avulsion fracture (chip fracture) of unspecified talus, initial encounter for closed fracture: Secondary | ICD-10-CM | POA: Diagnosis not present

## 2017-04-27 DIAGNOSIS — F339 Major depressive disorder, recurrent, unspecified: Secondary | ICD-10-CM | POA: Diagnosis not present

## 2017-04-27 MED ORDER — DOCUSATE SODIUM 250 MG PO CAPS: 250.0000 mg | ORAL_CAPSULE | Freq: Every day | ORAL | 0 refills | Status: AC

## 2017-04-27 MED ORDER — DOCUSATE SODIUM 100 MG PO CAPS
200.0000 mg | ORAL_CAPSULE | Freq: Every day | ORAL | Status: DC
Start: 2017-04-27 — End: 2017-04-27

## 2017-04-27 MED ORDER — DIAZEPAM 5 MG PO TABS: 2.5000 mg | ORAL_TABLET | Freq: Two times a day (BID) | ORAL | 0 refills | Status: AC | PRN

## 2017-04-27 MED ORDER — HYDROCODONE-ACETAMINOPHEN 5-325 MG PO TABS: 1.0000 | ORAL_TABLET | ORAL | 0 refills | Status: DC | PRN

## 2017-04-27 MED ORDER — HYDROCODONE-ACETAMINOPHEN 5-325 MG PO TABS
1.0000 | ORAL_TABLET | ORAL | Status: DC | PRN
  Administered 2017-04-27 (×2): 1 via ORAL
  Filled 2017-04-27 (×2): qty 1

## 2017-04-27 MED ORDER — HYDROCODONE-ACETAMINOPHEN 5-325 MG PO TABS
2.0000 | ORAL_TABLET | Freq: Once | ORAL | Status: AC
  Administered 2017-04-27: 2 via ORAL
  Filled 2017-04-27: qty 2

## 2017-04-27 NOTE — ED Notes (Signed)
Lights dimmed to room as requested. States her husband is coming and she wants to sleep until then.

## 2017-04-27 NOTE — ED Notes (Signed)
Family members x 2 have arrived to bedside.

## 2017-04-27 NOTE — ED Notes (Signed)
Lying on bed talking on phone. Spouse went home to get her meds then will return. Both are aware and in agreement w/tx plan - accepted to [facility] and may arrive at 1400.

## 2017-04-27 NOTE — ED Provider Notes (Signed)
Notified that patient has bed available at SNF. Labs, imaging reviewed - pt has small non-displaced sacral fx. Will need PT at SNF. Pt o/w HDS. She has been tolerating pain meds w/o difficulty. Of note, pt taking valium scheduled at home. Given that she will need analgesia for her sacral fx, will half this dose and advise her to discuss with her PCP given ongoing use of this high risk med. D/c to SNF.    Duffy Bruce, MD 04/27/17 Darlin Drop

## 2017-04-27 NOTE — ED Notes (Signed)
Attempted to call report to [facility] x 3 - no answer. Family w/pt.

## 2017-04-27 NOTE — ED Notes (Signed)
Fall Risk arm band applied to pt - FALL RISK sign placed on door.

## 2017-04-27 NOTE — ED Notes (Signed)
Left message for Katelyn Lamb, [place], to notify spouse has arrived to bedside. Spouse advised he spoke w/her approx 45 min ago and was advised she is checking on [facility].

## 2017-04-27 NOTE — Clinical Social Work Note (Signed)
Clinical Social Worker continuing to follow patient and family for support and discharge planning needs.  CSW provided patient husband with additional offers this morning and they have chosen placement at [facility].  CSW notified facility who is agreeable to weekend admission and received insurance authorization from [facility] Josem Kaufmann # [id number]).  CSW updated patient husband and RN - Therapist, sports to arrange transport via [facility] to [facility].  No additional social work needs identified at this time.  CSW signing off.  Barbette Or, [place]

## 2017-04-27 NOTE — Care Management Note (Signed)
Case Management Note  Patient Details  Name: Katelyn Lamb MRN: [id number] Date of Birth: 08-25-1946  Subjective/Objective:  Pt was held in the ED last pm awaiting SNF. Made Evelena Peat, CSW aware. She is making alternative bed offers and has made it clear to family pt will need to choose and be discharged this am.                  Action/Plan:CM will sign off for now but will be available should additional discharge needs arise or disposition change.    Expected Discharge Date:                  Expected Discharge Plan:  [facility]  In-House Referral:  Clinical Social Work  Discharge planning Services  CM Consult  Post Acute Care Choice:    Choice offered to:  Patient, Adult Children, Spouse  DME Arranged:    DME Agency:     HH Arranged:  RN, PT, OT, Nurse's Aide, Social Work [employer] Agency:  [facility]  Status of Service:  Completed, signed off  If discussed at [employer], dates discussed:    Additional Comments:  Delrae Sawyers, RN 04/27/2017, 10:01 AM

## 2017-04-27 NOTE — ED Notes (Signed)
Moved pt to F7 - closer to nurses' station. Pt states her family is trying to arrange home care d/t unable to care for self. Pt noted to be alert, oriented, cooperative.

## 2017-04-27 NOTE — ED Notes (Signed)
Patient was given a Cup of Sprite and Water and [employer] and Peanut Butter, A Regular Diet was ordered for Lunch.

## 2017-04-27 NOTE — ED Notes (Signed)
Pt and family aware pt should be transported to [facility] soon - waiting for paperwork and narcotic rx's from [facility]P. Pt states she has all of her belongings - purse and cell phone.

## 2017-04-27 NOTE — ED Notes (Signed)
Spouse advised pt c/o itching after taking Percocet yesterday. Percocet added to pt's allergy list.

## 2017-04-29 ENCOUNTER — Other Ambulatory Visit: Payer: PPO

## 2017-04-29 ENCOUNTER — Ambulatory Visit: Payer: PPO | Admitting: Physical Therapy

## 2017-04-29 DIAGNOSIS — G990 Autonomic neuropathy in diseases classified elsewhere: Secondary | ICD-10-CM | POA: Diagnosis not present

## 2017-04-29 DIAGNOSIS — F339 Major depressive disorder, recurrent, unspecified: Secondary | ICD-10-CM | POA: Diagnosis not present

## 2017-04-30 ENCOUNTER — Ambulatory Visit: Payer: PPO | Admitting: Physical Therapy

## 2017-04-30 ENCOUNTER — Encounter: Payer: PPO | Admitting: Occupational Therapy

## 2017-05-02 ENCOUNTER — Encounter: Payer: PPO | Admitting: Occupational Therapy

## 2017-05-02 ENCOUNTER — Ambulatory Visit: Payer: PPO | Admitting: Physical Therapy

## 2017-05-02 NOTE — Telephone Encounter (Signed)
Called Crystal back at [facility]. Relayed I spoke with husband. Keep orders the same for valium 2.5 mg q12 prn per Dr Krista Blue.   She requested new rx for lyrica 50mg  to take one at night for 2 weeks and then d/c per CW,MD note on 04/26/17. Fax: [phone].  Advised I will speak with WID, Dr Krista Blue about this.

## 2017-05-02 NOTE — Telephone Encounter (Signed)
Called patient husband back. Patient was discharged from hospital to [facility]. She got their Saturday. He went over prescriptions with them, but they stated they did not have changes to lyrica/valium that he spoke with Dr Jannifer Franklin about on 04/26/17. I advised that they usually need this in writing. Since nothing called in, they did not have this. I relayed Dr Krista Blue recommendations with him. Advised I will call crystal back and let her know about recommendations and that I spoke with him as well. He states she is in isolation for CDIFF. She is currently being treated for this.

## 2017-05-02 NOTE — Telephone Encounter (Signed)
Called and LVM for husband to call.   Per Dr Krista Blue, patient can take valium only PRN. He needs to be aware that by her taking this, given her hx of falls, gait disorder, brain disease, this puts her at a risk for falls. They need to make sure she has assistance. Do not take medication daily, only prn.

## 2017-05-02 NOTE — Telephone Encounter (Signed)
Called Crystal back. I relayed message from Dr Jannifer Franklin on 04/26/17. She verbalized understanding.  She stated however, that when pt went to hospital, they changed valium to 2.5 mg every 12 hr prn. She only takes as needed now.  Wondering how she should be taking medication, if she should still stop medication or continue to take as prescribed by hospital. Advised CW,MD out of office this week. Will speak with WID and call back to advise.

## 2017-05-02 NOTE — Telephone Encounter (Signed)
Pt husband has returned the call to RN Terrence Dupont, he is asking for a call back

## 2017-05-02 NOTE — Telephone Encounter (Signed)
Crystal/[facility] [phone] called patient has come to them since after discharge from the hospital. There is a discrepancy in the dosing of diazepam (VALIUM) 5 MG tablet and pregabalin (LYRICA) 50 MG capsule . Please call to advise

## 2017-05-07 ENCOUNTER — Encounter: Payer: Self-pay | Admitting: Occupational Therapy

## 2017-05-07 ENCOUNTER — Ambulatory Visit: Payer: PPO | Admitting: Physical Therapy

## 2017-05-07 ENCOUNTER — Ambulatory Visit: Payer: PPO | Admitting: Occupational Therapy

## 2017-05-07 MED ORDER — PREGABALIN 50 MG PO CAPS: 50.0000 mg | ORAL_CAPSULE | Freq: Two times a day (BID) | ORAL | Status: DC

## 2017-05-07 NOTE — Therapy (Signed)
[facility] [address] [place], [name], [address] Phone: [phone]   Fax:  [phone]  Patient Details  Name: Katelyn Lamb MRN: [id number] Date of Birth: 1946-05-12 Referring Provider:  No ref. provider found  Encounter Date: 05/07/2017  OCCUPATIONAL THERAPY DISCHARGE SUMMARY    Current functional level related to goals / functional outcomes: Pt did not fully meet all goals. Goals were not reassessed as pt fell and broke her pelvis. Pt is now in a skilled facility.   Remaining deficits: Decreased strength, decreased coordination, decreased balance, impaired sensation,   Education / Equipment: Pt and husband were educated in HEP and therapist recommended pt use of a walker for improved safety with ambulation. Pt practiced use in the clinic with assistance. Pt and husband reported pt was not using a walker at home. Plan: Patient agrees to discharge.  Patient goals were not met. Patient is being discharged due to a change in medical status.  ?????      Tyress Loden 05/07/2017, 1:02 PM  [facility] [address] [place], [name], [address] Phone: [phone]   Fax:  [phone]

## 2017-05-07 NOTE — Telephone Encounter (Signed)
I called the husband, left a message, I will call the extended care facility and increase the dose of Lyrica to 50 mg twice daily.  The number for the facility is [phone].

## 2017-05-07 NOTE — Telephone Encounter (Signed)
Pt husband is calling back re: pregabalin (LYRICA) 50 MG capsule he states that currently pt is down to 1 pill a day but her nerve pain has increased and he would like for her to go to 2 a day.  Pt husband is asking for a call back

## 2017-05-07 NOTE — Addendum Note (Signed)
Addended by: [name] on: 05/07/2017 06:16 PM   Modules accepted: Orders

## 2017-05-07 NOTE — Telephone Encounter (Signed)
Pt husband stating if a change is going to be made to medication the [facility] would have to be contacted.

## 2017-05-16 ENCOUNTER — Ambulatory Visit: Payer: PPO | Admitting: Neurology

## 2017-05-20 MED ORDER — PREGABALIN 50 MG PO CAPS: ORAL_CAPSULE | ORAL | Status: DC

## 2017-05-20 NOTE — Telephone Encounter (Addendum)
Pt's husband called in she is experiencing increased pain in the back, groin, feet and butt over the past week to 10 days. She is also experiencing loss of feeling in her hands and new nerve pain in her feet. He said she is also having some increased  coordination problems with her hands and arms. He is wanting to know if the medication should be increased again or try new medication .Please call to discuss him at [phone]

## 2017-05-20 NOTE — Addendum Note (Signed)
Addended by: [name] on: 05/20/2017 04:39 PM   Modules accepted: Orders

## 2017-05-20 NOTE — Telephone Encounter (Signed)
I called the patient. The patient has had ongoing discomfort, revisit she had been on Lyrica taking 50 mg in the morning 100 mg in the evening, we will return to this dose. The pain has increased since she has fallen and fractured her pelvis. She has pain in the buttocks area and in the hands and feet.

## 2017-05-20 NOTE — Telephone Encounter (Signed)
Faxed orders to [facility] at [phone]. Received fax confirmation.

## 2017-05-23 DIAGNOSIS — A09 Infectious gastroenteritis and colitis, unspecified: Secondary | ICD-10-CM | POA: Diagnosis not present

## 2017-05-31 ENCOUNTER — Ambulatory Visit
Admission: RE | Admit: 2017-05-31 | Discharge: 2017-05-31 | Disposition: A | Discharge status: Home / Self Care | Payer: PPO | Source: Ambulatory Visit | Attending: Family Medicine | Admitting: Family Medicine

## 2017-05-31 DIAGNOSIS — I82502 Chronic embolism and thrombosis of unspecified deep veins of left lower extremity: Secondary | ICD-10-CM | POA: Diagnosis not present

## 2017-06-21 DIAGNOSIS — G0490 Encephalitis and encephalomyelitis, unspecified: Secondary | ICD-10-CM | POA: Diagnosis not present

## 2017-06-25 DIAGNOSIS — R194 Change in bowel habit: Secondary | ICD-10-CM | POA: Diagnosis not present

## 2017-06-25 DIAGNOSIS — K5730 Diverticulosis of large intestine without perforation or abscess without bleeding: Secondary | ICD-10-CM | POA: Diagnosis not present

## 2017-06-25 DIAGNOSIS — K219 Gastro-esophageal reflux disease without esophagitis: Secondary | ICD-10-CM | POA: Diagnosis not present

## 2017-06-25 DIAGNOSIS — Z86010 Personal history of colonic polyps: Secondary | ICD-10-CM | POA: Diagnosis not present

## 2017-06-29 DIAGNOSIS — F411 Generalized anxiety disorder: Secondary | ICD-10-CM | POA: Diagnosis not present

## 2017-06-29 DIAGNOSIS — F3342 Major depressive disorder, recurrent, in full remission: Secondary | ICD-10-CM | POA: Diagnosis not present

## 2017-07-04 DIAGNOSIS — G0490 Encephalitis and encephalomyelitis, unspecified: Secondary | ICD-10-CM | POA: Diagnosis not present

## 2017-07-08 NOTE — Telephone Encounter (Signed)
Pt is asking for a call back re: possibly increasing her Lyrica due to nerve pain from virus in her brain.  Pt is aware  Of her appointment next Thurs the 15th but doesn't feel she can wait until then for relief.  Please call

## 2017-07-08 NOTE — Telephone Encounter (Signed)
I called the patient, okay to go back to 50 mg in the morning 100 mg in the evening, if need be she can go up to 50 mg twice during the day and 100 mg at night, need to be careful about the balance issues, I will see her in 10 days.

## 2017-07-13 ENCOUNTER — Other Ambulatory Visit: Payer: Self-pay | Admitting: Neurology

## 2017-07-18 ENCOUNTER — Ambulatory Visit (INDEPENDENT_AMBULATORY_CARE_PROVIDER_SITE_OTHER): Payer: PPO | Admitting: Neurology

## 2017-07-18 ENCOUNTER — Encounter: Payer: Self-pay | Admitting: Neurology

## 2017-07-18 VITALS — BP 109/81 | HR 107 | Wt 134.5 lb

## 2017-07-18 DIAGNOSIS — R269 Unspecified abnormalities of gait and mobility: Secondary | ICD-10-CM

## 2017-07-18 DIAGNOSIS — G0490 Encephalitis and encephalomyelitis, unspecified: Secondary | ICD-10-CM | POA: Diagnosis not present

## 2017-07-18 MED ORDER — LEVETIRACETAM 250 MG PO TABS: 500.0000 mg | ORAL_TABLET | Freq: Two times a day (BID) | ORAL | 3 refills | Status: DC

## 2017-07-18 NOTE — Progress Notes (Signed)
Reason for visit: Meningoencephalitis  Katelyn Lamb is an 71 y.o. 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  The patient has been relatively stable since last seen, she had a fall and fractured her pelvis on 27 April 2017.  The patient has recovered from this.  She continues to have numbness in the hands with discomfort, she has numbness in the groin and buttocks area, she has gait instability.  She is on Lyrica taking 50 mg in the morning and 100 mg in the evening, higher doses resulted in worsening gait stability.  She also takes Cymbalta 120 mg daily.  The patient at times has difficulty sleeping at night.  She drops things from her hands frequently.  She does have a walker, but she does not use the walker regularly.  She returns to this office for an evaluation.  She is followed through [facility], she will be having MRI of the brain, cervical spine, and thoracic spine in December 2018.  Past Medical History:  Diagnosis Date  . Anxiety   . Back pain   . Gait abnormality 11/14/2016  . Myelitis due to herpes simplex [facility])     Past Surgical History:  Procedure Laterality Date  . ABDOMINAL HYSTERECTOMY    . BLADDER REPAIR    . CESAREAN SECTION    . TUBAL LIGATION      Family History  Problem Relation Age of Onset  . Hypertension Mother     Social history:  reports that she has quit smoking. she has never used smokeless tobacco. She reports that she does not drink alcohol or use drugs.    Allergies  Allergen Reactions  . Demerol [Meperidine] Other (See Comments)    Hallucinations  . Percocet [Oxycodone-Acetaminophen] Itching  . Amoxicillin-Pot Clavulanate Other (See Comments)    Severe pain, headache, intestinal infection  . Penicillins Itching and Rash    Has patient had a PCN reaction causing immediate rash, facial/tongue/throat swelling, SOB or  lightheadedness with hypotension: Yes Has patient had a PCN reaction causing severe rash involving mucus membranes or skin necrosis: No Has patient had a PCN reaction that required hospitalization: No Has patient had a PCN reaction occurring within the last 10 years: Yes If all of the above answers are "NO", then may proceed with Cephalosporin use.    Medications:  Prior to Admission medications   Medication Sig Start Date End Date Taking? Authorizing Provider  acyclovir (ZOVIRAX) 400 MG tablet Take 1 tablet (400 mg total) by mouth 2 (two) times daily. Patient taking differently: Take 400 mg by mouth 2 (two) times daily. Maintenance dose - continuous 12/26/16  Yes [name], MD  ALPRAZolam Duanne Moron) 1 MG tablet Take 1 mg by mouth at bedtime as needed for anxiety or sleep.  10/26/16  Yes [provider]  Calcium Carb-Cholecalciferol (CALCIUM+D3 PO) Take 1 tablet by mouth 2 (two) times daily. 1200 mg calcium +D3    Yes [provider]  Cyanocobalamin (VITAMIN B12 PO) Take 1 tablet by mouth daily.   Yes [provider]  DULoxetine (CYMBALTA) 60 MG capsule TAKE 1 CAPSULE (60 MG TOTAL) BY MOUTH 2 (TWO) TIMES DAILY. 07/15/17  Yes [name], MD  HYDROcodone-acetaminophen (NORCO) 5-325 MG tablet Take 1-2 tablets by mouth every 4 (four) hours as needed for moderate pain. 04/27/17  Yes Duffy Bruce, MD  Menthol, Topical  Analgesic, (STOPAIN EX) Apply 1 application topically See admin instructions. Spray topically at bedtime as needed for pain   Yes [provider]  omega-3 acid ethyl esters (LOVAZA) 1 g capsule Take 1 capsule (1 g total) by mouth 2 (two) times daily. 10/26/16  Yes Angiulli, Lavon Paganini, PA-C  pantoprazole (PROTONIX) 40 MG tablet Take 1 tablet (40 mg total) by mouth daily. 10/26/16  Yes Angiulli, Lavon Paganini, PA-C  polyethylene glycol (MIRALAX / GLYCOLAX) packet Take 17 g by mouth daily. Patient taking differently: Take 17 g by mouth daily as needed  (constipation).  10/26/16  Yes Angiulli, Lavon Paganini, PA-C  pregabalin (LYRICA) 50 MG capsule 1 The Morning, 2 in the Evening 05/20/17  Yes [name], MD  promethazine (PHENERGAN) 25 MG suppository Place 25 mg rectally every 12 (twelve) hours as needed for nausea or vomiting (diarrha).  04/23/17  Yes [provider]    ROS:  Out of a complete 14 system review of symptoms, the patient complains only of the following symptoms, and all other reviewed systems are negative.  Diarrhea Joint pain, joint swelling, back pain, walking difficulty Numbness, weakness  Blood pressure 109/81, pulse (!) 107, weight 134 lb 8 oz (61 kg).  Physical Exam  General: The patient is alert and cooperative at the time of the examination.  Skin: No significant peripheral edema is noted.   Neurologic Exam  Mental status: The patient is alert and oriented x 3 at the time of the examination. The patient has apparent normal recent and remote memory, with an apparently normal attention span and concentration ability.   Cranial nerves: Facial symmetry is present. Speech is normal, no aphasia or dysarthria is noted. Extraocular movements are full. Visual fields are full.  Motor: The patient has good strength in all 4 extremities.  Sensory examination: Soft touch sensation is symmetric on the face, arms, and legs.  The patient reports decreased soft touch sensation in both lower extremities.  Coordination: The patient has mild ataxia with finger-nose-finger and heel shin bilaterally.   The patient has clumsiness when trying to perform handwriting.  Gait and station: The patient has a slightly wide-based gait, the patient can walk independently.  Tandem gait is unsteady.  Romberg is negative.  Reflexes: Deep tendon reflexes are symmetric.   Assessment/Plan:  1.  Meningoencephalitis  2.  Gait disorder  3.  Chronic pain syndrome  The patient will be given a trial on Keppra working up to 500 mg  twice daily to see if this helps the discomfort in the hands and body.  The patient will remain on the Cymbalta and the Lyrica.  They will call for any dose adjustments of the Keppra.  She will have her MRI studies done through [facility] in December.  The will follow-up in 6 months.  Jill Alexanders MD 07/18/2017 2:21 PM  [facility] [address] [place], [place] [address]  Phone [phone] Fax [phone]

## 2017-07-18 NOTE — Patient Instructions (Signed)
   We will start [place] for the pain in the hands.  Start 250 mg twice a day for 2 weeks, then take one twice a day.

## 2017-07-23 DIAGNOSIS — M5136 Other intervertebral disc degeneration, lumbar region: Secondary | ICD-10-CM | POA: Diagnosis not present

## 2017-07-23 DIAGNOSIS — M5416 Radiculopathy, lumbar region: Secondary | ICD-10-CM | POA: Diagnosis not present

## 2017-07-26 ENCOUNTER — Other Ambulatory Visit: Payer: Self-pay | Admitting: Neurology

## 2017-07-26 DIAGNOSIS — G0490 Encephalitis and encephalomyelitis, unspecified: Secondary | ICD-10-CM

## 2017-07-26 DIAGNOSIS — G8222 Paraplegia, incomplete: Secondary | ICD-10-CM

## 2017-07-30 ENCOUNTER — Other Ambulatory Visit: Payer: Self-pay | Admitting: Neurology

## 2017-07-30 NOTE — Telephone Encounter (Signed)
Faxed printed/signed rx Lyrica to [facility] at [phone]. Received fax confirmation.

## 2017-08-20 ENCOUNTER — Telehealth: Payer: Self-pay | Admitting: Neurology

## 2017-08-20 DIAGNOSIS — G894 Chronic pain syndrome: Secondary | ICD-10-CM | POA: Diagnosis not present

## 2017-08-20 DIAGNOSIS — M5416 Radiculopathy, lumbar region: Secondary | ICD-10-CM | POA: Diagnosis not present

## 2017-08-20 DIAGNOSIS — M542 Cervicalgia: Secondary | ICD-10-CM | POA: Diagnosis not present

## 2017-08-20 DIAGNOSIS — M5136 Other intervertebral disc degeneration, lumbar region: Secondary | ICD-10-CM | POA: Diagnosis not present

## 2017-08-20 NOTE — Telephone Encounter (Signed)
On the last 3 visit, they had indicated that the MRI studies of the brain, cervical spine and thoracic spine were set up to be done at [facility] in December.  I therefore did not order any studies.  The studies can be done here, will need to be compared to prior studies done in April 2018.  They are to contact me if they wish me to order these studies.

## 2017-08-20 NOTE — Telephone Encounter (Signed)
Pt husband(on DPR) has called and stated that he was under the impression that the MRI would be scheduled to be done here instead of having to take pt to [facility].  Pt husband is asking for a call back to know if at all possible the MRI can be done at [facility] before the year is out

## 2017-08-30 DIAGNOSIS — M5124 Other intervertebral disc displacement, thoracic region: Secondary | ICD-10-CM | POA: Diagnosis not present

## 2017-08-30 DIAGNOSIS — D1809 Hemangioma of other sites: Secondary | ICD-10-CM | POA: Diagnosis not present

## 2017-08-30 DIAGNOSIS — G0490 Encephalitis and encephalomyelitis, unspecified: Secondary | ICD-10-CM | POA: Diagnosis not present

## 2017-09-02 DIAGNOSIS — M542 Cervicalgia: Secondary | ICD-10-CM | POA: Diagnosis not present

## 2017-09-04 DIAGNOSIS — R29898 Other symptoms and signs involving the musculoskeletal system: Secondary | ICD-10-CM | POA: Diagnosis not present

## 2017-09-06 DIAGNOSIS — G8929 Other chronic pain: Secondary | ICD-10-CM | POA: Insufficient documentation

## 2017-09-06 DIAGNOSIS — G894 Chronic pain syndrome: Secondary | ICD-10-CM | POA: Insufficient documentation

## 2017-09-12 DIAGNOSIS — G8929 Other chronic pain: Secondary | ICD-10-CM | POA: Insufficient documentation

## 2017-09-12 DIAGNOSIS — R29898 Other symptoms and signs involving the musculoskeletal system: Secondary | ICD-10-CM | POA: Diagnosis not present

## 2017-09-12 DIAGNOSIS — M542 Cervicalgia: Secondary | ICD-10-CM | POA: Diagnosis not present

## 2017-09-12 DIAGNOSIS — M545 Low back pain: Secondary | ICD-10-CM | POA: Diagnosis not present

## 2017-09-16 ENCOUNTER — Other Ambulatory Visit: Payer: Self-pay | Admitting: *Deleted

## 2017-09-16 DIAGNOSIS — M545 Low back pain: Secondary | ICD-10-CM | POA: Diagnosis not present

## 2017-09-16 DIAGNOSIS — M542 Cervicalgia: Secondary | ICD-10-CM | POA: Diagnosis not present

## 2017-09-16 DIAGNOSIS — R29898 Other symptoms and signs involving the musculoskeletal system: Secondary | ICD-10-CM | POA: Diagnosis not present

## 2017-09-16 NOTE — Patient Outreach (Signed)
[facility] High Risk patient called and left a message requesting a return call.  Eulah Pont. Myrtie Neither, MSN, [facility] Gerontological Nurse Practitioner [facility] Care Management [phone]

## 2017-09-17 ENCOUNTER — Other Ambulatory Visit: Payer: Self-pay | Admitting: *Deleted

## 2017-09-17 NOTE — Patient Outreach (Signed)
[facility]A HIgh Risk patient screen #2 attempted without success, however, I was able to leave a message and requested a return call.  Eulah Pont. Myrtie Neither, MSN, [facility] Gerontological Nurse Practitioner [facility] Care Management [phone]

## 2017-09-19 ENCOUNTER — Encounter: Payer: Self-pay | Admitting: *Deleted

## 2017-09-19 ENCOUNTER — Other Ambulatory Visit: Payer: Self-pay | Admitting: *Deleted

## 2017-09-19 NOTE — Patient Outreach (Signed)
3rd [facility] High Risk call without success. I did leave another message and will now send her a [facility] letter with our care management information.  Katelyn Lamb. Myrtie Neither, MSN, [facility] Gerontological Nurse Practitioner [facility] Care Management [phone]

## 2017-09-23 DIAGNOSIS — M545 Low back pain: Secondary | ICD-10-CM | POA: Diagnosis not present

## 2017-09-23 DIAGNOSIS — M542 Cervicalgia: Secondary | ICD-10-CM | POA: Diagnosis not present

## 2017-09-23 DIAGNOSIS — R29898 Other symptoms and signs involving the musculoskeletal system: Secondary | ICD-10-CM | POA: Diagnosis not present

## 2017-09-26 DIAGNOSIS — R29898 Other symptoms and signs involving the musculoskeletal system: Secondary | ICD-10-CM | POA: Diagnosis not present

## 2017-09-26 DIAGNOSIS — M545 Low back pain: Secondary | ICD-10-CM | POA: Diagnosis not present

## 2017-09-26 DIAGNOSIS — M542 Cervicalgia: Secondary | ICD-10-CM | POA: Diagnosis not present

## 2017-10-03 DIAGNOSIS — M542 Cervicalgia: Secondary | ICD-10-CM | POA: Diagnosis not present

## 2017-10-03 DIAGNOSIS — M545 Low back pain: Secondary | ICD-10-CM | POA: Diagnosis not present

## 2017-10-03 DIAGNOSIS — R29898 Other symptoms and signs involving the musculoskeletal system: Secondary | ICD-10-CM | POA: Diagnosis not present

## 2017-10-29 ENCOUNTER — Telehealth: Payer: Self-pay | Admitting: Neurology

## 2017-10-29 DIAGNOSIS — K219 Gastro-esophageal reflux disease without esophagitis: Secondary | ICD-10-CM | POA: Diagnosis not present

## 2017-10-29 DIAGNOSIS — R300 Dysuria: Secondary | ICD-10-CM | POA: Diagnosis not present

## 2017-10-29 DIAGNOSIS — H6121 Impacted cerumen, right ear: Secondary | ICD-10-CM | POA: Diagnosis not present

## 2017-10-29 DIAGNOSIS — M792 Neuralgia and neuritis, unspecified: Secondary | ICD-10-CM | POA: Diagnosis not present

## 2017-10-29 DIAGNOSIS — R2689 Other abnormalities of gait and mobility: Secondary | ICD-10-CM | POA: Diagnosis not present

## 2017-10-29 MED ORDER — PREGABALIN 50 MG PO CAPS: ORAL_CAPSULE | ORAL | 0 refills | Status: DC

## 2017-10-29 NOTE — Telephone Encounter (Signed)
Patient's husband calling to get a 3 month supply of pregabalin (LYRICA) 50 MG capsule [facility].  He said would save money if got 3 month supply.

## 2017-10-29 NOTE — Addendum Note (Signed)
Addended by: Hope Pigeon on: 10/29/2017 01:37 PM   Modules accepted: Orders

## 2017-10-29 NOTE — Telephone Encounter (Signed)
Faxed printed/signed rx to [phone]. Received fax confirmation.

## 2017-10-29 NOTE — Telephone Encounter (Signed)
Rx printed, waiting on MD signature 

## 2017-11-02 ENCOUNTER — Other Ambulatory Visit: Payer: Self-pay | Admitting: Neurology

## 2017-11-06 DIAGNOSIS — M5416 Radiculopathy, lumbar region: Secondary | ICD-10-CM | POA: Insufficient documentation

## 2017-11-08 DIAGNOSIS — M5136 Other intervertebral disc degeneration, lumbar region: Secondary | ICD-10-CM | POA: Insufficient documentation

## 2017-11-08 DIAGNOSIS — M51369 Other intervertebral disc degeneration, lumbar region without mention of lumbar back pain or lower extremity pain: Secondary | ICD-10-CM | POA: Insufficient documentation

## 2017-11-21 DIAGNOSIS — M5416 Radiculopathy, lumbar region: Secondary | ICD-10-CM | POA: Diagnosis not present

## 2017-11-21 DIAGNOSIS — M5136 Other intervertebral disc degeneration, lumbar region: Secondary | ICD-10-CM | POA: Diagnosis not present

## 2017-12-09 ENCOUNTER — Other Ambulatory Visit: Payer: Self-pay | Admitting: *Deleted

## 2017-12-09 MED ORDER — DULOXETINE HCL 60 MG PO CPEP: 60.0000 mg | ORAL_CAPSULE | Freq: Two times a day (BID) | ORAL | 0 refills | Status: DC

## 2017-12-11 DIAGNOSIS — H903 Sensorineural hearing loss, bilateral: Secondary | ICD-10-CM | POA: Diagnosis not present

## 2017-12-11 DIAGNOSIS — Z822 Family history of deafness and hearing loss: Secondary | ICD-10-CM | POA: Diagnosis not present

## 2017-12-12 DIAGNOSIS — H3589 Other specified retinal disorders: Secondary | ICD-10-CM | POA: Diagnosis not present

## 2017-12-19 DIAGNOSIS — M545 Low back pain: Secondary | ICD-10-CM | POA: Diagnosis not present

## 2017-12-19 DIAGNOSIS — R102 Pelvic and perineal pain: Secondary | ICD-10-CM | POA: Diagnosis not present

## 2017-12-31 DIAGNOSIS — M5136 Other intervertebral disc degeneration, lumbar region: Secondary | ICD-10-CM | POA: Diagnosis not present

## 2018-01-09 DIAGNOSIS — Z79899 Other long term (current) drug therapy: Secondary | ICD-10-CM | POA: Diagnosis not present

## 2018-01-09 DIAGNOSIS — M542 Cervicalgia: Secondary | ICD-10-CM | POA: Diagnosis not present

## 2018-01-09 DIAGNOSIS — M5416 Radiculopathy, lumbar region: Secondary | ICD-10-CM | POA: Diagnosis not present

## 2018-01-09 DIAGNOSIS — M545 Low back pain: Secondary | ICD-10-CM | POA: Diagnosis not present

## 2018-01-09 DIAGNOSIS — G894 Chronic pain syndrome: Secondary | ICD-10-CM | POA: Diagnosis not present

## 2018-01-09 DIAGNOSIS — M5136 Other intervertebral disc degeneration, lumbar region: Secondary | ICD-10-CM | POA: Diagnosis not present

## 2018-01-09 DIAGNOSIS — M4856XD Collapsed vertebra, not elsewhere classified, lumbar region, subsequent encounter for fracture with routine healing: Secondary | ICD-10-CM | POA: Diagnosis not present

## 2018-01-16 DIAGNOSIS — R197 Diarrhea, unspecified: Secondary | ICD-10-CM | POA: Diagnosis not present

## 2018-01-21 ENCOUNTER — Encounter: Payer: Self-pay | Admitting: Neurology

## 2018-01-21 ENCOUNTER — Ambulatory Visit (INDEPENDENT_AMBULATORY_CARE_PROVIDER_SITE_OTHER): Payer: PPO | Admitting: Neurology

## 2018-01-21 VITALS — BP 90/60 | HR 77 | Ht 66.0 in | Wt 135.5 lb

## 2018-01-21 DIAGNOSIS — G0490 Encephalitis and encephalomyelitis, unspecified: Secondary | ICD-10-CM

## 2018-01-21 MED ORDER — DULOXETINE HCL 60 MG PO CPEP: 60.0000 mg | ORAL_CAPSULE | Freq: Every day | ORAL | 0 refills | Status: DC

## 2018-01-21 MED ORDER — AMITRIPTYLINE HCL 10 MG PO TABS: ORAL_TABLET | ORAL | 3 refills | Status: DC

## 2018-01-21 NOTE — Patient Instructions (Signed)
   Reduce the cymbalta to 60 mg in the morning and begin amitriptyline at night.

## 2018-01-21 NOTE — Progress Notes (Signed)
Reason for visit: Encephalomyelitis  Katelyn Lamb is an 72 y.o. female  History of present illness:  Katelyn Lamb is a 72 year old right-handed white female with a history of encephalomyelitis associated with chronic pain and gait disorder, sensory alteration in the arms and legs.  The patient has fallen on occasion, she uses a walker for ambulation, she has had at least one fall with a walker.  The patient continued to have ongoing discomfort, she is on Lyrica and Cymbalta.  She has issues with episodic diarrhea that has been a problem recently for her.  She is seeing Dr. Collene Mares for this.  She returns for an evaluation.  She was given Keppra on her last visit, she could not tolerate the medication.  She has had MRI of the brain, cervical spine, and thoracic spine at [facility] in December 2018, the studies were stable.  No new lesions were seen.  Past Medical History:  Diagnosis Date  . Anxiety   . Back pain   . Gait abnormality 11/14/2016  . Myelitis due to herpes simplex [facility])     Past Surgical History:  Procedure Laterality Date  . ABDOMINAL HYSTERECTOMY    . BLADDER REPAIR    . CESAREAN SECTION    . TUBAL LIGATION      Family History  Problem Relation Age of Onset  . Hypertension Mother     Social history:  reports that she has quit smoking. She has never used smokeless tobacco. She reports that she does not drink alcohol or use drugs.    Allergies  Allergen Reactions  . Demerol [Meperidine] Other (See Comments)    Hallucinations  . Percocet [Oxycodone-Acetaminophen] Itching  . Amoxicillin-Pot Clavulanate Other (See Comments)    Severe pain, headache, intestinal infection  . Penicillins Itching and Rash    Has patient had a PCN reaction causing immediate rash, facial/tongue/throat swelling, SOB or lightheadedness with hypotension: Yes Has patient had a PCN reaction causing severe rash involving mucus membranes or skin necrosis: No Has patient had a PCN reaction  that required hospitalization: No Has patient had a PCN reaction occurring within the last 10 years: Yes If all of the above answers are "NO", then may proceed with Cephalosporin use.    Medications:  Prior to Admission medications   Medication Sig Start Date End Date Taking? Authorizing Provider  acyclovir (ZOVIRAX) 400 MG tablet TAKE 1 TABLET (400 MG TOTAL) BY MOUTH 2 (TWO) TIMES DAILY. 07/29/17  Yes [name], MD  Calcium Carb-Cholecalciferol (CALCIUM+D3 PO) Take 1 tablet by mouth 2 (two) times daily. 1200 mg calcium +D3    Yes [provider]  Cyanocobalamin (VITAMIN B12 PO) Take 1 tablet by mouth daily.   Yes [provider]  DULoxetine (CYMBALTA) 60 MG capsule Take 1 capsule (60 mg total) by mouth 2 (two) times daily. 12/09/17  Yes [name], MD  HYDROcodone-acetaminophen (NORCO) 5-325 MG tablet Take 1-2 tablets by mouth every 4 (four) hours as needed for moderate pain. 04/27/17  Yes Duffy Bruce, MD  Menthol, Topical Analgesic, (STOPAIN EX) Apply 1 application topically See admin instructions. Spray topically at bedtime as needed for pain   Yes [provider]  omega-3 acid ethyl esters (LOVAZA) 1 g capsule Take 1 capsule (1 g total) by mouth 2 (two) times daily. 10/26/16  Yes Angiulli, Lavon Paganini, PA-C  pantoprazole (PROTONIX) 40 MG tablet Take 1 tablet (40 mg total) by mouth daily. 10/26/16  Yes Angiulli, Lavon Paganini, PA-C  polyethylene glycol (MIRALAX / GLYCOLAX) packet Take 17 g by mouth daily. Patient taking differently: Take 17 g by mouth daily as needed (constipation).  10/26/16  Yes Angiulli, Lavon Paganini, PA-C  pregabalin (LYRICA) 50 MG capsule TAKE 1 CAPSULE BY MOUTH IN THE MORNING AND 2 CAPSULES IN THE EVENING 10/29/17  Yes [name], MD  ALPRAZolam Duanne Moron) 1 MG tablet Take 1 mg by mouth at bedtime as needed for anxiety or sleep.  10/26/16   [provider]  promethazine (PHENERGAN) 25 MG suppository Place 25 mg rectally every 12  (twelve) hours as needed for nausea or vomiting (diarrha).  04/23/17   [provider]    ROS:  Out of a complete 14 system review of symptoms, the patient complains only of the following symptoms, and all other reviewed systems are negative.  Numbness, weakness Gait instability Diarrhea  Blood pressure 90/60, pulse 77, height 5\' 6"  (1.676 m), weight 135 lb 8 oz (61.5 kg).  Physical Exam  General: The patient is alert and cooperative at the time of the examination.  Skin: No significant peripheral edema is noted.   Neurologic Exam  Mental status: The patient is alert and oriented x 3 at the time of the examination. The patient has apparent normal recent and remote memory, with an apparently normal attention span and concentration ability.   Cranial nerves: Facial symmetry is present. Speech is normal, no aphasia or dysarthria is noted. Extraocular movements are full. Visual fields are full.  Motor: The patient has good strength in all 4 extremities, with exception some slight weakness in the intrinsic muscles of the hands bilaterally..  Sensory examination: Soft touch sensation is symmetric on the face, arms, and legs.  Coordination: The patient has some dysmetria with finger-nose-finger bilaterally, good heel-to-shin bilaterally.  Gait and station: The patient has a wide-based gait, unsteady gait.  Tandem gait was unsteady.  Romberg is negative..  Reflexes: Deep tendon reflexes are symmetric.   Assessment/Plan:  1.  Encephalomyelitis  The patient continues to have ongoing pain and discomfort, she has significant gait instability and is at risk for falling.  She is having episodes of diarrhea.  The patient will go down on the Cymbalta taking 60 mg in the morning, we will start amitriptyline at nighttime, working up to 30 mg at night.  In the past, this has been very helpful for her diarrhea.  Hopefully this will also help her discomfort and help her rest at night.   She will call for any dose adjustments.  She otherwise will follow-up in 6 months.  The patient is now off of her anticoagulant medications, she could potentially use carbamazepine in the future for nerve pain.  Jill Alexanders MD 01/21/2018 3:19 PM  [facility] [address] [place], [place] [address]  Phone [phone] Fax [phone]

## 2018-01-22 ENCOUNTER — Telehealth: Payer: Self-pay | Admitting: *Deleted

## 2018-01-22 NOTE — Telephone Encounter (Signed)
Submitted PA Amitriptyline 10mg  tab on covermymeds. Waiting on determination. Key: [id number] - PA Case ID: [id number]  "[employer] has received your information, and the request will be reviewed.. If you have any questions please contact [employer] at [phone]."

## 2018-01-22 NOTE — Telephone Encounter (Signed)
Received fax notification from [employer] that PA approved effective 01/22/18-09/02/18.   F[facility]ed [facility] notifying them of approval at [phone]. Received fax confirmation.

## 2018-01-23 ENCOUNTER — Telehealth: Payer: Self-pay | Admitting: Neurology

## 2018-01-23 MED ORDER — DULOXETINE HCL 60 MG PO CPEP: 60.0000 mg | ORAL_CAPSULE | Freq: Two times a day (BID) | ORAL | 0 refills | Status: DC

## 2018-01-23 NOTE — Telephone Encounter (Signed)
I called the patient.  The patient amitriptyline at night 10 mg, she stopped the nighttime dose of Cymbalta 60 mg.  She has had increased pain overnight, she has felt somewhat unsteady, floppy today.  They will go back on the Cymbalta at 60 twice daily, if they can cut the amitriptyline tablet in half they will start at 5 mg at night to see if this helps the constipation issues and the pain, and helps her sleep.

## 2018-01-23 NOTE — Telephone Encounter (Signed)
Pt's husband called the pt took 1st dose of amitriptyline (ELAVIL) 10 MG tablet last night at bedtime as prescribed and did not take the cymbalta. He states she slept all night. Today she's had difficulty with walking, standing, and is limp like a ragdoll, difficulty staying awake. He is requesting a call back to discuss. He said she did take meds as normal this morning. Please call to discuss

## 2018-01-24 ENCOUNTER — Other Ambulatory Visit: Payer: Self-pay | Admitting: Neurology

## 2018-01-24 DIAGNOSIS — G0490 Encephalitis and encephalomyelitis, unspecified: Secondary | ICD-10-CM

## 2018-01-24 DIAGNOSIS — G8222 Paraplegia, incomplete: Secondary | ICD-10-CM

## 2018-01-30 ENCOUNTER — Other Ambulatory Visit: Payer: Self-pay | Admitting: Neurology

## 2018-02-03 ENCOUNTER — Telehealth (HOSPITAL_COMMUNITY): Payer: Self-pay

## 2018-02-03 NOTE — Telephone Encounter (Signed)
Called to schedule consult, no answer, left vm. AW  

## 2018-02-04 ENCOUNTER — Other Ambulatory Visit (HOSPITAL_COMMUNITY): Payer: Self-pay | Admitting: Interventional Radiology

## 2018-02-04 DIAGNOSIS — M4856XD Collapsed vertebra, not elsewhere classified, lumbar region, subsequent encounter for fracture with routine healing: Secondary | ICD-10-CM

## 2018-02-10 ENCOUNTER — Telehealth: Payer: Self-pay | Admitting: Neurology

## 2018-02-10 DIAGNOSIS — M4856XD Collapsed vertebra, not elsewhere classified, lumbar region, subsequent encounter for fracture with routine healing: Secondary | ICD-10-CM | POA: Diagnosis not present

## 2018-02-10 DIAGNOSIS — R29818 Other symptoms and signs involving the nervous system: Secondary | ICD-10-CM

## 2018-02-10 DIAGNOSIS — M5136 Other intervertebral disc degeneration, lumbar region: Secondary | ICD-10-CM | POA: Diagnosis not present

## 2018-02-10 DIAGNOSIS — M5416 Radiculopathy, lumbar region: Secondary | ICD-10-CM | POA: Diagnosis not present

## 2018-02-10 DIAGNOSIS — M545 Low back pain: Secondary | ICD-10-CM | POA: Diagnosis not present

## 2018-02-10 DIAGNOSIS — Z79899 Other long term (current) drug therapy: Secondary | ICD-10-CM | POA: Diagnosis not present

## 2018-02-10 DIAGNOSIS — G894 Chronic pain syndrome: Secondary | ICD-10-CM | POA: Diagnosis not present

## 2018-02-10 NOTE — Telephone Encounter (Signed)
Pt husband(on DPR) has called and he 1st wants to know if Dr Jannifer Franklin got the images from [facility] for the MRI done in Dec 2018.  Husband is wanting to schedule another MRI to compare to Dec 2018's MRI to see if the status of the disease.  Pt husband is asking to be called

## 2018-02-10 NOTE — Telephone Encounter (Signed)
I do have the results of the MRI studies done of the brain, cervical spine, and thoracic spine.  The written reports are available, I do not have the actual scan images from December 2018.  I am not sure why we would want to rescan her if there has been no real change in her clinical condition.  I would prefer to follow her clinically right now.

## 2018-02-12 ENCOUNTER — Ambulatory Visit (HOSPITAL_COMMUNITY)
Admission: RE | Admit: 2018-02-12 | Discharge: 2018-02-12 | Disposition: A | Discharge status: Home / Self Care | Payer: PPO | Source: Ambulatory Visit | Attending: Interventional Radiology | Admitting: Interventional Radiology

## 2018-02-12 ENCOUNTER — Encounter (HOSPITAL_COMMUNITY): Payer: Self-pay

## 2018-02-12 DIAGNOSIS — M4856XD Collapsed vertebra, not elsewhere classified, lumbar region, subsequent encounter for fracture with routine healing: Secondary | ICD-10-CM

## 2018-02-12 DIAGNOSIS — S32050A Wedge compression fracture of fifth lumbar vertebra, initial encounter for closed fracture: Secondary | ICD-10-CM | POA: Diagnosis not present

## 2018-02-12 NOTE — Consult Note (Signed)
Chief Complaint: Patient was seen in consultation today for lumbar 2 and lumbar 5 compression fractures.  Referring Physician(s): Suella Broad  Supervising Physician: Luanne Bras  Patient Status: [facility] - Out-pt  History of Present Illness: Katelyn Lamb is a 72 y.o. 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 A few weeks ago, she was attempting to open a drawer and fell backwards and landed on her back on a hard wooden floor. She then went to Dr. Nelva Bush for back pain management.  IR requested by Dr. Nelva Bush for management of lumbar 2 and lumbar 5 compression fracture. Patient awake and alert sitting in wheelchair. Accompanied by husband and two children. Complains of midline lower back pain. Rates pain 5/10 at this time. States moving makes pain worse. States back pain is associated with numbness down bilateral legs. Denies fever, chills, chest pain, dyspnea, abdominal pain, dizziness, or bladder/bowel incontinence.  Past Medical History:  Diagnosis Date  . Anxiety   . Back pain   . Gait abnormality 11/14/2016  . Myelitis due to herpes simplex [facility])     Past Surgical History:  Procedure Laterality Date  . ABDOMINAL HYSTERECTOMY    . BLADDER REPAIR    . CESAREAN SECTION    . TUBAL LIGATION      Allergies: Demerol [meperidine]; Percocet [oxycodone-acetaminophen]; Amoxicillin-pot clavulanate; and Penicillins  Medications: Prior to Admission medications   Medication Sig Start Date End Date Taking? Authorizing Provider  acyclovir (ZOVIRAX) 400 MG tablet TAKE 1 TABLET BY MOUTH TWICE A DAY 01/24/18   [name], MD  ALPRAZolam Duanne Moron) 1 MG tablet Take 1 mg by mouth at bedtime as needed for anxiety or sleep.  10/26/16   [provider]  amitriptyline (ELAVIL) 10 MG tablet Take one tablet at night for one week, then take 2 tablets at night for one week, then take 3 tablets at night. 01/21/18   [name],  MD  Calcium Carb-Cholecalciferol (CALCIUM+D3 PO) Take 1 tablet by mouth 2 (two) times daily. 1200 mg calcium +D3     [provider]  Cyanocobalamin (VITAMIN B12 PO) Take 1 tablet by mouth daily.    [provider]  DULoxetine (CYMBALTA) 60 MG capsule Take 1 capsule (60 mg total) by mouth 2 (two) times daily. 01/23/18   [name], MD  HYDROcodone-acetaminophen (NORCO) 5-325 MG tablet Take 1-2 tablets by mouth every 4 (four) hours as needed for moderate pain. 04/27/17   Duffy Bruce, MD  Menthol, Topical Analgesic, (STOPAIN EX) Apply 1 application topically See admin instructions. Spray topically at bedtime as needed for pain    [provider]  omega-3 acid ethyl esters (LOVAZA) 1 g capsule Take 1 capsule (1 g total) by mouth 2 (two) times daily. 10/26/16   Angiulli, Lavon Paganini, PA-C  pantoprazole (PROTONIX) 40 MG tablet Take 1 tablet (40 mg total) by mouth daily. 10/26/16   Angiulli, Lavon Paganini, PA-C  polyethylene glycol (MIRALAX / GLYCOLAX) packet Take 17 g by mouth daily. Patient taking differently: Take 17 g by mouth daily as needed (constipation).  10/26/16   Angiulli, Lavon Paganini, PA-C  pregabalin (LYRICA) 50 MG capsule TAKE 1 CAPSULE IN THE MORNING AND 2 CAPSULES IN THE EVENING 01/31/18   [name], MD  promethazine (PHENERGAN) 25 MG suppository Place 25 mg rectally every 12 (twelve) hours as needed for nausea or vomiting (diarrha).  04/23/17   [provider]  Family History  Problem Relation Age of Onset  . Hypertension Mother     Social History   Socioeconomic History  . Marital status: Married    Spouse name: [name]  . Number of children: 2  . Years of education: 42  . Highest education level: Not on file  Occupational History  . Not on file  Social Needs  . Financial resource strain: Not on file  . Food insecurity:    Worry: Not on file    Inability: Not on file  . Transportation needs:    Medical: Not on file    Non-medical:  Not on file  Tobacco Use  . Smoking status: Former Research scientist (life sciences)  . Smokeless tobacco: Never Used  Substance and Sexual Activity  . Alcohol use: No  . Drug use: No  . Sexual activity: Not on file  Lifestyle  . Physical activity:    Days per week: Not on file    Minutes per session: Not on file  . Stress: Not on file  Relationships  . Social connections:    Talks on phone: Not on file    Gets together: Not on file    Attends religious service: Not on file    Active member of club or organization: Not on file    Attends meetings of clubs or organizations: Not on file    Relationship status: Not on file  Other Topics Concern  . Not on file  Social History Narrative   Lives with husband   Caffeine use: Green tea   Soda ocass   Coffee rare   Right-handed     Review of Systems: A 12 point ROS discussed and pertinent positives are indicated in the HPI above.  All other systems are negative.  Review of Systems  Constitutional: Negative for chills and fever.  Respiratory: Negative for shortness of breath and wheezing.   Cardiovascular: Negative for chest pain and palpitations.  Gastrointestinal: Negative for abdominal pain.  Musculoskeletal: Positive for back pain.  Neurological: Positive for numbness. Negative for dizziness.  Psychiatric/Behavioral: Negative for behavioral problems and confusion.    Vital Signs: There were no vitals taken for this visit.  Physical Exam  Constitutional: She is oriented to person, place, and time. She appears well-developed and well-nourished. No distress.  Pulmonary/Chest: Effort normal. No respiratory distress.  Musculoskeletal:  Moderate tenderness of midline lower back.  Neurological: She is alert and oriented to person, place, and time.  Skin: Skin is warm and dry.  Psychiatric: She has a normal mood and affect. Her behavior is normal. Judgment and thought content normal.  Nursing note and vitals reviewed.    Imaging: No results  found.  Labs:  CBC: No results for input(s): WBC, HGB, HCT, PLT in the last 8760 hours.  COAGS: No results for input(s): INR, APTT in the last 8760 hours.  BMP: No results for input(s): NA, K, CL, CO2, GLUCOSE, BUN, CALCIUM, CREATININE, GFRNONAA, GFRAA in the last 8760 hours.  Invalid input(s): CMP  LIVER FUNCTION TESTS: No results for input(s): BILITOT, AST, ALT, ALKPHOS, PROT, ALBUMIN in the last 8760 hours.  TUMOR MARKERS: No results for input(s): AFPTM, CEA, CA199, CHROMGRNA in the last 8760 hours.  Assessment and Plan:  Lumbar 2 and lumbar 5 compression fractures. Reviewed imaging with patient and family members. Explained that the the best course of management for her compression fractures is with a procedure called a kyphoplasty/vertebroplasty. Explained procedure, including risks and benefits.  Plan for follow-up lumbar 2 and  lumbar 5 kyphoplasties/vertebroplasties ASAP. Patient asks that we call her to let her know if her insurance is approved before she decides to move forward with procedure. Informed patient that our schedulers will call her to inform her of insurance approval. Instructed patient no bending, stooping, or lifting anything above 10 pounds prior to procedure.  All questions answered and concerns addressed. Patient conveys understanding and agrees with plan.  Thank you for this interesting consult.  I greatly enjoyed meeting Katelyn Lamb and look forward to participating in their care.  A copy of this report was sent to the requesting provider on this date.  Electronically Signed: Earley Abide, PA-C 02/12/2018, 12:58 PM   I spent a total of 30 Minutes in face to face in clinical consultation, greater than 50% of which was counseling/coordinating care for lumbar 2 and lumbar 5 compression fractures.

## 2018-02-13 ENCOUNTER — Other Ambulatory Visit (HOSPITAL_COMMUNITY): Payer: Self-pay | Admitting: Interventional Radiology

## 2018-02-17 DIAGNOSIS — R350 Frequency of micturition: Secondary | ICD-10-CM | POA: Diagnosis not present

## 2018-02-19 NOTE — Addendum Note (Signed)
Addended by: [name] on: 02/19/2018 05:03 PM   Modules accepted: Orders

## 2018-02-19 NOTE — Telephone Encounter (Signed)
Pt's husband called he is wanting to make sure the disease is still in check and is not coming back is the reasoning for rescanning every 6 mths. He is asking if Dr Jannifer Franklin would call to discuss this with him. He is also going to contact [facility] to get MRI disks sent for his review. He can be reached at (c) [phone]

## 2018-02-19 NOTE — Telephone Encounter (Signed)
I called the husband.  The patient has had some episodes of confusion, usually coming out of sleep, the patient will not recognize her husband at times.  We will go ahead and get an MRI of the brain and cervical spine, compare to prior studies.

## 2018-02-20 ENCOUNTER — Telehealth: Payer: Self-pay | Admitting: Neurology

## 2018-02-20 DIAGNOSIS — R194 Change in bowel habit: Secondary | ICD-10-CM | POA: Diagnosis not present

## 2018-02-20 DIAGNOSIS — K5904 Chronic idiopathic constipation: Secondary | ICD-10-CM | POA: Diagnosis not present

## 2018-02-20 DIAGNOSIS — K219 Gastro-esophageal reflux disease without esophagitis: Secondary | ICD-10-CM | POA: Diagnosis not present

## 2018-02-20 DIAGNOSIS — K5730 Diverticulosis of large intestine without perforation or abscess without bleeding: Secondary | ICD-10-CM | POA: Diagnosis not present

## 2018-02-20 NOTE — Telephone Encounter (Signed)
Health team order sent to [facility]. No auth via their website. They will reach out to the pt to schedule.

## 2018-03-05 ENCOUNTER — Telehealth (HOSPITAL_COMMUNITY): Payer: Self-pay

## 2018-03-05 NOTE — Telephone Encounter (Signed)
Called to schedule KP, no answer, left vm. AW 

## 2018-03-11 ENCOUNTER — Telehealth (HOSPITAL_COMMUNITY): Payer: Self-pay

## 2018-03-11 NOTE — Telephone Encounter (Signed)
Called to schedule. Pt has an infection and going to see her dentist this week. She would like to get that taken care of first and then schedule her KP. I will call her at the end of next week to see if she would like to schedule. She agreed with this plan. AW

## 2018-03-13 ENCOUNTER — Other Ambulatory Visit (HOSPITAL_COMMUNITY): Payer: Self-pay | Admitting: Interventional Radiology

## 2018-03-13 DIAGNOSIS — S32050A Wedge compression fracture of fifth lumbar vertebra, initial encounter for closed fracture: Secondary | ICD-10-CM

## 2018-03-13 DIAGNOSIS — S32020A Wedge compression fracture of second lumbar vertebra, initial encounter for closed fracture: Secondary | ICD-10-CM

## 2018-03-14 ENCOUNTER — Ambulatory Visit
Admission: RE | Admit: 2018-03-14 | Discharge: 2018-03-14 | Disposition: A | Discharge status: Home / Self Care | Payer: PPO | Source: Ambulatory Visit | Attending: Neurology | Admitting: Neurology

## 2018-03-14 DIAGNOSIS — R29818 Other symptoms and signs involving the nervous system: Secondary | ICD-10-CM

## 2018-03-14 MED ORDER — GADOBENATE DIMEGLUMINE 529 MG/ML IV SOLN
12.0000 mL | Freq: Once | INTRAVENOUS | Status: AC | PRN
  Administered 2018-03-14: 12 mL via INTRAVENOUS

## 2018-03-17 ENCOUNTER — Other Ambulatory Visit: Payer: Self-pay | Admitting: Interventional Radiology

## 2018-03-17 NOTE — Progress Notes (Signed)
Brain MRI with and without contrast shows chronic findings as compared to last year in April with the exception of one area in the right frontal lobe that has increased in size. While there is nothing to suggest an acute problem, further evaluation and management should be discussed with Dr. Jannifer Franklin upon his return. There is evidence of atrophy which is volume loss of the brain which also has increased compared to last year. Again, I would recommend that Dr. Jannifer Franklin review these findings with her in more detail upon his return. No immediate action is required as far as these test results, please update patient. Star Age, MD, PhD [facility])

## 2018-03-17 NOTE — Progress Notes (Signed)
Cervical spine MRI with and without contrast from last week shows mild to moderate degenerative changes, spots on her spinal cord have actually improved compared to last year in April, which is reassuring, nothing acute was seen. Please notify patient. Star Age, MD, PhD [facility])

## 2018-03-18 ENCOUNTER — Ambulatory Visit (HOSPITAL_COMMUNITY)
Admission: RE | Admit: 2018-03-18 | Discharge: 2018-03-18 | Disposition: A | Discharge status: Home / Self Care | Payer: PPO | Source: Ambulatory Visit | Attending: Interventional Radiology | Admitting: Interventional Radiology

## 2018-03-18 ENCOUNTER — Telehealth: Payer: Self-pay | Admitting: *Deleted

## 2018-03-18 ENCOUNTER — Encounter (HOSPITAL_COMMUNITY): Payer: Self-pay

## 2018-03-18 DIAGNOSIS — R350 Frequency of micturition: Secondary | ICD-10-CM | POA: Diagnosis not present

## 2018-03-18 NOTE — Telephone Encounter (Signed)
-----   Message from Star Age, MD sent at 03/17/2018 12:21 PM EDT ----- Brain MRI with and without contrast shows chronic findings as compared to last year in April with the exception of one area in the right frontal lobe that has increased in size. While there is nothing to suggest an acute problem, further evaluation and management should be discussed with Dr. Jannifer Franklin upon his return. There is evidence of atrophy which is volume loss of the brain which also has increased compared to last year. Again, I would recommend that Dr. Jannifer Franklin review these findings with her in more detail upon his return. No immediate action is required as far as these test results, please update patient. Star Age, MD, PhD [facility])

## 2018-03-18 NOTE — Telephone Encounter (Signed)
Called and spoke w/ husband and patient about MRI brain and cervical spine results per Dr. Rexene Alberts note. They are aware Dr. Rexene Alberts reviewed since Dr. Jannifer Franklin out but she would like them to still f/u with Dr. Jannifer Franklin once he returns. They requested to come in for appt to review in person. I scheduled appt for 04/09/18 at 3pm, check in 2:30pm. Advised I will call if any one cx earlier than that. I will send message to Dr. Jannifer Franklin for when he returns. They verbalized understanding and appreciation for call.

## 2018-03-22 ENCOUNTER — Other Ambulatory Visit: Payer: Self-pay | Admitting: Neurology

## 2018-03-23 ENCOUNTER — Telehealth: Payer: Self-pay | Admitting: Neurology

## 2018-03-23 NOTE — Telephone Encounter (Signed)
I called the patient.  I had a chance to review the disc from [facility] with a MRI of the brain that was done on 30 August 2017.  The most recent studies here were done and compared to a prior study in April 2018.  The cervical spine MRI was stable, there was some question of progression of the right frontal lesions on the MRI of the brain done most recently, when I compared this to the August 30, 2017 study, there is no change in the right frontal lobe lesions.

## 2018-03-24 NOTE — Telephone Encounter (Signed)
FYI-Patient returning a calling stating she would like to keep the appointment with Dr. Jannifer Franklin on 04-09-18 to further discuss imaging results.

## 2018-03-24 NOTE — Telephone Encounter (Signed)
Noted, I will keep her on the schedule

## 2018-03-24 NOTE — Telephone Encounter (Signed)
Called and lvm for pt to call. I see she spoke with Dr. Jannifer Franklin about imaging results. Wanting to know if they still want 04/09/18 appt with Dr. Jannifer Franklin. That was previously made for her and husband to come in and review results.

## 2018-03-25 ENCOUNTER — Other Ambulatory Visit: Payer: Self-pay | Admitting: Neurology

## 2018-03-26 ENCOUNTER — Inpatient Hospital Stay (HOSPITAL_COMMUNITY)
Admission: EM | Admit: 2018-03-26 | Discharge: 2018-03-29 | DRG: 871 | Disposition: A | Discharge status: Home / Self Care | Payer: PPO | Attending: Internal Medicine | Admitting: Internal Medicine

## 2018-03-26 ENCOUNTER — Emergency Department (HOSPITAL_COMMUNITY): Payer: PPO

## 2018-03-26 ENCOUNTER — Encounter (HOSPITAL_COMMUNITY): Payer: Self-pay

## 2018-03-26 ENCOUNTER — Telehealth: Payer: Self-pay | Admitting: Neurology

## 2018-03-26 DIAGNOSIS — R000 Tachycardia, unspecified: Secondary | ICD-10-CM | POA: Diagnosis not present

## 2018-03-26 DIAGNOSIS — Z880 Allergy status to penicillin: Secondary | ICD-10-CM

## 2018-03-26 DIAGNOSIS — R531 Weakness: Secondary | ICD-10-CM | POA: Diagnosis not present

## 2018-03-26 DIAGNOSIS — Z885 Allergy status to narcotic agent status: Secondary | ICD-10-CM

## 2018-03-26 DIAGNOSIS — G032 Benign recurrent meningitis [Mollaret]: Secondary | ICD-10-CM | POA: Diagnosis not present

## 2018-03-26 DIAGNOSIS — Z79890 Hormone replacement therapy (postmenopausal): Secondary | ICD-10-CM

## 2018-03-26 DIAGNOSIS — Z79899 Other long term (current) drug therapy: Secondary | ICD-10-CM | POA: Diagnosis not present

## 2018-03-26 DIAGNOSIS — G8929 Other chronic pain: Secondary | ICD-10-CM | POA: Diagnosis present

## 2018-03-26 DIAGNOSIS — Z881 Allergy status to other antibiotic agents status: Secondary | ICD-10-CM | POA: Diagnosis not present

## 2018-03-26 DIAGNOSIS — N390 Urinary tract infection, site not specified: Secondary | ICD-10-CM | POA: Diagnosis not present

## 2018-03-26 DIAGNOSIS — F329 Major depressive disorder, single episode, unspecified: Secondary | ICD-10-CM | POA: Diagnosis not present

## 2018-03-26 DIAGNOSIS — Z79891 Long term (current) use of opiate analgesic: Secondary | ICD-10-CM | POA: Diagnosis not present

## 2018-03-26 DIAGNOSIS — R062 Wheezing: Secondary | ICD-10-CM

## 2018-03-26 DIAGNOSIS — B004 Herpesviral encephalitis: Secondary | ICD-10-CM | POA: Diagnosis present

## 2018-03-26 DIAGNOSIS — Z87891 Personal history of nicotine dependence: Secondary | ICD-10-CM | POA: Diagnosis not present

## 2018-03-26 DIAGNOSIS — A419 Sepsis, unspecified organism: Secondary | ICD-10-CM | POA: Diagnosis present

## 2018-03-26 DIAGNOSIS — R6510 Systemic inflammatory response syndrome (SIRS) of non-infectious origin without acute organ dysfunction: Secondary | ICD-10-CM | POA: Diagnosis present

## 2018-03-26 DIAGNOSIS — A4189 Other specified sepsis: Secondary | ICD-10-CM | POA: Diagnosis not present

## 2018-03-26 DIAGNOSIS — R410 Disorientation, unspecified: Secondary | ICD-10-CM | POA: Diagnosis not present

## 2018-03-26 DIAGNOSIS — R0902 Hypoxemia: Secondary | ICD-10-CM | POA: Diagnosis not present

## 2018-03-26 DIAGNOSIS — R300 Dysuria: Secondary | ICD-10-CM

## 2018-03-26 DIAGNOSIS — B001 Herpesviral vesicular dermatitis: Secondary | ICD-10-CM | POA: Diagnosis not present

## 2018-03-26 DIAGNOSIS — F419 Anxiety disorder, unspecified: Secondary | ICD-10-CM | POA: Diagnosis present

## 2018-03-26 LAB — URINALYSIS, ROUTINE W REFLEX MICROSCOPIC
BILIRUBIN URINE: NEGATIVE
Glucose, UA: NEGATIVE mg/dL
Hgb urine dipstick: NEGATIVE
KETONES UR: NEGATIVE mg/dL
LEUKOCYTES UA: NEGATIVE
Nitrite: NEGATIVE
Protein, ur: NEGATIVE mg/dL
Specific Gravity, Urine: 1.005 (ref 1.005–1.030)
pH: 8 (ref 5.0–8.0)

## 2018-03-26 LAB — CBC WITH DIFFERENTIAL/PLATELET
BASOS ABS: 0 10*3/uL (ref 0.0–0.1)
BASOS PCT: 0 %
Eosinophils Absolute: 0.2 10*3/uL (ref 0.0–0.7)
Eosinophils Relative: 1 %
HEMATOCRIT: 40 % (ref 36.0–46.0)
HEMOGLOBIN: 12.7 g/dL (ref 12.0–15.0)
Lymphocytes Relative: 7 %
Lymphs Abs: 1.4 10*3/uL (ref 0.7–4.0)
MCH: 30.1 pg (ref 26.0–34.0)
MCHC: 31.8 g/dL (ref 30.0–36.0)
MCV: 94.8 fL (ref 78.0–100.0)
MONOS PCT: 4 %
Monocytes Absolute: 0.8 10*3/uL (ref 0.1–1.0)
NEUTROS ABS: 17.2 10*3/uL — AB (ref 1.7–7.7)
NEUTROS PCT: 88 %
Platelets: 231 10*3/uL (ref 150–400)
RBC: 4.22 MIL/uL (ref 3.87–5.11)
RDW: 14.3 % (ref 11.5–15.5)
WBC: 19.5 10*3/uL — AB (ref 4.0–10.5)

## 2018-03-26 LAB — COMPREHENSIVE METABOLIC PANEL
ALT: 18 U/L (ref 0–44)
ANION GAP: 10 (ref 5–15)
AST: 31 U/L (ref 15–41)
Albumin: 3.7 g/dL (ref 3.5–5.0)
Alkaline Phosphatase: 122 U/L (ref 38–126)
BILIRUBIN TOTAL: 0.4 mg/dL (ref 0.3–1.2)
BUN: 14 mg/dL (ref 8–23)
CHLORIDE: 100 mmol/L (ref 98–111)
CO2: 26 mmol/L (ref 22–32)
Calcium: 9 mg/dL (ref 8.9–10.3)
Creatinine, Ser: 0.91 mg/dL (ref 0.44–1.00)
Glucose, Bld: 150 mg/dL — ABNORMAL HIGH (ref 70–99)
POTASSIUM: 4.1 mmol/L (ref 3.5–5.1)
Sodium: 136 mmol/L (ref 135–145)
TOTAL PROTEIN: 7.2 g/dL (ref 6.5–8.1)

## 2018-03-26 LAB — I-STAT CG4 LACTIC ACID, ED
LACTIC ACID, VENOUS: 2.36 mmol/L — AB (ref 0.5–1.9)
Lactic Acid, Venous: 1.71 mmol/L (ref 0.5–1.9)

## 2018-03-26 LAB — PROTIME-INR
INR: 1.06
PROTHROMBIN TIME: 13.7 s (ref 11.4–15.2)

## 2018-03-26 MED ORDER — SODIUM CHLORIDE 0.9 % IV BOLUS
500.0000 mL | Freq: Once | INTRAVENOUS | Status: AC
  Administered 2018-03-27: 500 mL via INTRAVENOUS

## 2018-03-26 MED ORDER — SODIUM CHLORIDE 0.9 % IV SOLN
INTRAVENOUS | Status: DC
  Administered 2018-03-27 (×2): via INTRAVENOUS

## 2018-03-26 MED ORDER — LEVOFLOXACIN IN D5W 500 MG/100ML IV SOLN: 500.0000 mg | INTRAVENOUS | Status: DC

## 2018-03-26 MED ORDER — LEVOFLOXACIN IN D5W 750 MG/150ML IV SOLN
750.0000 mg | Freq: Once | INTRAVENOUS | Status: AC
  Administered 2018-03-26: 750 mg via INTRAVENOUS
  Filled 2018-03-26: qty 150

## 2018-03-26 MED ORDER — ACETAMINOPHEN 650 MG RE SUPP: 650.0000 mg | Freq: Four times a day (QID) | RECTAL | Status: DC | PRN

## 2018-03-26 MED ORDER — ONDANSETRON HCL 4 MG/2ML IJ SOLN: 4.0000 mg | Freq: Four times a day (QID) | INTRAMUSCULAR | Status: DC | PRN

## 2018-03-26 MED ORDER — PREGABALIN 50 MG PO CAPS
100.0000 mg | ORAL_CAPSULE | Freq: Every day | ORAL | Status: DC
  Administered 2018-03-27 – 2018-03-28 (×3): 100 mg via ORAL
  Filled 2018-03-26 (×3): qty 2

## 2018-03-26 MED ORDER — DULOXETINE HCL 30 MG PO CPEP
60.0000 mg | ORAL_CAPSULE | Freq: Two times a day (BID) | ORAL | Status: DC
  Administered 2018-03-27 – 2018-03-29 (×6): 60 mg via ORAL
  Filled 2018-03-26 (×6): qty 2

## 2018-03-26 MED ORDER — PANTOPRAZOLE SODIUM 40 MG PO TBEC
40.0000 mg | DELAYED_RELEASE_TABLET | Freq: Every day | ORAL | Status: DC
  Administered 2018-03-27 – 2018-03-29 (×3): 40 mg via ORAL
  Filled 2018-03-26 (×3): qty 1

## 2018-03-26 MED ORDER — VANCOMYCIN HCL IN DEXTROSE 1-5 GM/200ML-% IV SOLN
1000.0000 mg | Freq: Once | INTRAVENOUS | Status: AC
  Administered 2018-03-26: 1000 mg via INTRAVENOUS
  Filled 2018-03-26: qty 200

## 2018-03-26 MED ORDER — DICLOFENAC SODIUM 1 % TD GEL
2.0000 g | Freq: Four times a day (QID) | TRANSDERMAL | Status: DC
  Administered 2018-03-27 – 2018-03-28 (×8): 2 g via TOPICAL
  Filled 2018-03-26: qty 100

## 2018-03-26 MED ORDER — POLYETHYLENE GLYCOL 3350 17 G PO PACK
17.0000 g | PACK | Freq: Every day | ORAL | Status: DC | PRN
  Administered 2018-03-28: 17 g via ORAL
  Filled 2018-03-26: qty 1

## 2018-03-26 MED ORDER — SODIUM CHLORIDE 0.9 % IV SOLN
2.0000 g | Freq: Once | INTRAVENOUS | Status: AC
  Administered 2018-03-26: 2 g via INTRAVENOUS
  Filled 2018-03-26: qty 2

## 2018-03-26 MED ORDER — HYDROCODONE-ACETAMINOPHEN 10-325 MG PO TABS
1.0000 | ORAL_TABLET | Freq: Three times a day (TID) | ORAL | Status: DC | PRN
  Administered 2018-03-27 – 2018-03-28 (×3): 1 via ORAL
  Filled 2018-03-26 (×3): qty 1

## 2018-03-26 MED ORDER — DIAZEPAM 5 MG PO TABS
5.0000 mg | ORAL_TABLET | Freq: Two times a day (BID) | ORAL | Status: DC
  Administered 2018-03-27 – 2018-03-29 (×6): 5 mg via ORAL
  Filled 2018-03-26 (×6): qty 1

## 2018-03-26 MED ORDER — ONDANSETRON HCL 4 MG PO TABS: 4.0000 mg | ORAL_TABLET | Freq: Four times a day (QID) | ORAL | Status: DC | PRN

## 2018-03-26 MED ORDER — DEXTROSE 5 % IV SOLN
600.0000 mg | Freq: Three times a day (TID) | INTRAVENOUS | Status: DC
  Administered 2018-03-27 – 2018-03-28 (×6): 600 mg via INTRAVENOUS
  Filled 2018-03-26 (×7): qty 12

## 2018-03-26 MED ORDER — ACETAMINOPHEN 325 MG PO TABS: 650.0000 mg | ORAL_TABLET | Freq: Four times a day (QID) | ORAL | Status: DC | PRN

## 2018-03-26 NOTE — H&P (Addendum)
History and Physical    Katelyn Lamb GUR:[id number] DOB: [birth year] DOA: 03/26/2018  PCP: [name], MD  Patient coming from: Home  I have personally briefly reviewed patient's old medical records in [place]  Chief Complaint: Fever, Generalized weakness  HPI: Katelyn Lamb is a 72 y.o. 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  Chronic acyclovir use since that time.  Disease has been stable for a number of months on MRIs.  She reports that for the past week she has been having increased urinary urgency, frequency and dysuria.  Initially seen by PCP a week ago, urine dipstick was negative; however, urine culture would come back with reported E.Coli (they dont have results right now in front of them).  She was seen yesterday by her primary care provider and started on Macrobid for a presumed UTI.  Today she felt worse, generalized weakness, some confusion, new onset of mild, frontal, headache.  New onset fever.  Difficulty walking at home.   ED Course: No meningismus, does have fever of 101.x.  Is taking PO acyclovir.  UA negative again today.  Headache improved / resolved at this time.   Review of Systems: As per HPI otherwise 10 point review of systems negative.   Past Medical History:  Diagnosis Date  . Anxiety   . Back pain   . Gait abnormality 11/14/2016  . Myelitis due to herpes simplex [facility])     Past Surgical History:  Procedure Laterality Date  . ABDOMINAL HYSTERECTOMY    . BLADDER REPAIR    . CESAREAN SECTION    . TUBAL LIGATION       reports that she has quit smoking. She has never used smokeless tobacco. She reports that she does not drink alcohol or use drugs.  Allergies  Allergen Reactions  . Demerol [Meperidine] Other (See Comments)    Hallucinations  . Percocet [Oxycodone-Acetaminophen] Itching  . Amoxicillin-Pot Clavulanate Other (See Comments)    Severe pain, headache, intestinal infection  .  Penicillins Itching and Rash    Has patient had a PCN reaction causing immediate rash, facial/tongue/throat swelling, SOB or lightheadedness with hypotension: Yes Has patient had a PCN reaction causing severe rash involving mucus membranes or skin necrosis: No Has patient had a PCN reaction that required hospitalization: No Has patient had a PCN reaction occurring within the last 10 years: Yes If all of the above answers are "NO", then may proceed with Cephalosporin use.    Family History  Problem Relation Age of Onset  . Hypertension Mother      Prior to Admission medications   Medication Sig Start Date End Date Taking? Authorizing Provider  acyclovir (ZOVIRAX) 400 MG tablet TAKE 1 TABLET BY MOUTH TWICE A DAY 01/24/18  Yes [name], MD  Biotin 10000 MCG TABS Take 1 tablet by mouth daily.   Yes [provider]  Calcium Carb-Cholecalciferol (CALCIUM+D3 PO) Take 1 tablet by mouth 2 (two) times daily. 1200 mg calcium +D3    Yes [provider]  Cyanocobalamin (VITAMIN B12 PO) Take 1 tablet by mouth daily.   Yes [provider]  diazepam (VALIUM) 5 MG tablet Take 5 mg by mouth 2 (two) times daily.   Yes [provider]  diclofenac sodium (VOLTAREN) 1 % GEL APPLY 2 GRAM TO THE AFFECTED AREA(S) 4 TIMES PER DAY 02/10/18  Yes [provider]  DULoxetine (CYMBALTA) 60 MG capsule TAKE 1 CAPSULE (60 MG TOTAL) BY MOUTH  2 (TWO) TIMES DAILY. 03/25/18  Yes [name], MD  HYDROcodone-acetaminophen [facility]) 10-325 MG tablet Take 1 tablet by mouth 3 (three) times daily as needed for severe pain.  03/07/18  Yes [provider]  nitrofurantoin, macrocrystal-monohydrate, (MACROBID) 100 MG capsule Take 100 mg by mouth 2 (two) times daily.  03/25/18  Yes [provider]  omega-3 acid ethyl esters (LOVAZA) 1 g capsule Take 1 capsule (1 g total) by mouth 2 (two) times daily. 10/26/16  Yes Angiulli, Lavon Paganini, PA-C  pantoprazole (PROTONIX) 40 MG  tablet Take 1 tablet (40 mg total) by mouth daily. 10/26/16  Yes Angiulli, Lavon Paganini, PA-C  pregabalin (LYRICA) 50 MG capsule TAKE 1 CAPSULE IN THE MORNING AND 2 CAPSULES IN THE EVENING 01/31/18  Yes [name], MD  ALPRAZolam Duanne Moron) 1 MG tablet Take 1 mg by mouth at bedtime as needed for anxiety or sleep.  10/26/16   [provider]  conjugated estrogens (PREMARIN) vaginal cream Place 1 Applicatorful vaginally daily as needed (itching).     [provider]  HYDROcodone-acetaminophen (NORCO) 5-325 MG tablet Take 1-2 tablets by mouth every 4 (four) hours as needed for moderate pain. Patient not taking: Reported on 03/26/2018 04/27/17   Duffy Bruce, MD  Menthol, Topical Analgesic, (STOPAIN EX) Apply 1 application topically See admin instructions. Spray topically at bedtime as needed for pain    [provider]  polyethylene glycol (MIRALAX / GLYCOLAX) packet Take 17 g by mouth daily. Patient taking differently: Take 17 g by mouth daily as needed (constipation).  10/26/16   Angiulli, Lavon Paganini, PA-C  promethazine (PHENERGAN) 25 MG suppository Place 25 mg rectally every 12 (twelve) hours as needed for nausea or vomiting (diarrha).  04/23/17   [provider]    Physical Exam: Vitals:   03/26/18 2030 03/26/18 2100 03/26/18 2104 03/26/18 2131  BP: 107/75 107/69  92/77  Pulse: 95 91  91  Resp: 14 20  (!) 25  Temp:   99.7 F (37.6 C)   TempSrc:   Oral   SpO2: 91% 94%  99%  Weight:      Height:        Constitutional: NAD, calm, comfortable Eyes: PERRL, lids and conjunctivae normal ENMT: Mucous membranes are moist. Posterior pharynx clear of any exudate or lesions.Normal dentition.  Neck: normal, supple, no masses, no thyromegaly Respiratory: clear to auscultation bilaterally, no wheezing, no crackles. Normal respiratory effort. No accessory muscle use.  Cardiovascular: Regular rate and rhythm, no murmurs / rubs / gallops. No extremity edema. 2+ pedal  pulses. No carotid bruits.  Abdomen: no tenderness, no masses palpated. No hepatosplenomegaly. Bowel sounds positive.  Musculoskeletal: no clubbing / cyanosis. No joint deformity upper and lower extremities. Good ROM, no contractures. Normal muscle tone.  Skin: no rashes, lesions, ulcers. No induration Neurologic: CN 2-12 grossly intact. Sensation intact, DTR normal. Strength 5/5 in all 4.  Psychiatric: Normal judgment and insight. Alert and oriented x 3. Normal mood.    Labs on Admission: I have personally reviewed following labs and imaging studies  CBC: Recent Labs  Lab 03/26/18 1926  WBC 19.5*  NEUTROABS 17.2*  HGB 12.7  HCT 40.0  MCV 94.8  PLT 601   Basic Metabolic Panel: Recent Labs  Lab 03/26/18 1926  NA 136  K 4.1  CL 100  CO2 26  GLUCOSE 150*  BUN 14  CREATININE 0.91  CALCIUM 9.0   GFR: Estimated Creatinine Clearance: 53.9 mL/min (by C-G formula based  on SCr of 0.91 mg/dL). Liver Function Tests: Recent Labs  Lab 03/26/18 1926  AST 31  ALT 18  ALKPHOS 122  BILITOT 0.4  PROT 7.2  ALBUMIN 3.7   No results for input(s): LIPASE, AMYLASE in the last 168 hours. No results for input(s): AMMONIA in the last 168 hours. Coagulation Profile: Recent Labs  Lab 03/26/18 1926  INR 1.06   Cardiac Enzymes: No results for input(s): CKTOTAL, CKMB, CKMBINDEX, TROPONINI in the last 168 hours. BNP (last 3 results) No results for input(s): PROBNP in the last 8760 hours. HbA1C: No results for input(s): HGBA1C in the last 72 hours. CBG: No results for input(s): GLUCAP in the last 168 hours. Lipid Profile: No results for input(s): CHOL, HDL, LDLCALC, TRIG, CHOLHDL, LDLDIRECT in the last 72 hours. Thyroid Function Tests: No results for input(s): TSH, T4TOTAL, FREET4, T3FREE, THYROIDAB in the last 72 hours. Anemia Panel: No results for input(s): VITAMINB12, FOLATE, FERRITIN, TIBC, IRON, RETICCTPCT in the last 72 hours. Urine analysis:    Component Value Date/Time    COLORURINE YELLOW 03/26/2018 2019   APPEARANCEUR CLEAR 03/26/2018 2019   LABSPEC 1.005 03/26/2018 2019   PHURINE 8.0 03/26/2018 2019   GLUCOSEU NEGATIVE 03/26/2018 2019   HGBUR NEGATIVE 03/26/2018 2019   BILIRUBINUR NEGATIVE 03/26/2018 2019   KETONESUR NEGATIVE 03/26/2018 2019   PROTEINUR NEGATIVE 03/26/2018 2019   NITRITE NEGATIVE 03/26/2018 2019   LEUKOCYTESUR NEGATIVE 03/26/2018 2019    Radiological Exams on Admission: Dg Chest Port 1 View  Result Date: 03/26/2018 CLINICAL DATA:  Fever, altered mental status and generalized weakness today. EXAM: PORTABLE CHEST 1 VIEW COMPARISON:  Chest x-ray dated 05/19/2014. FINDINGS: Heart size and mediastinal contours are within normal limits. Lungs are clear. No pleural effusion or pneumothorax seen. Osseous structures about the chest are unremarkable. IMPRESSION: No active disease.  No evidence of pneumonia or pulmonary edema. Electronically Signed   By: Franki Cabot M.D.   On: 03/26/2018 20:48    EKG: Independently reviewed.  Assessment/Plan Principal Problem:   SIRS (systemic inflammatory response syndrome) (HCC) Active Problems:   Dysuria   Delirium   Encephalitis due to human herpes simplex virus (HSV)    1. SIRS - DDx includes UTI (e.coli by report) vs HSV-2 ([place]) meningitis / early recurrent encephalitis. 1. Spoke with Dr. Leonel Ramsay: 2. Dont need to transfer to [facility] 3. Treat both UTI and HSV-2 empirically initially 4. Can stop the IV acyclovir when LP comes back without lymphocytic pleocytosis 5. Have ordered IR LP for AM 6. Will leave on levaquin for moment, family to try and get print-out of eagle culture and sensitivity. 7. Repeat UCx, BCx pending 8. IVF: NS at 77 2. Delirium - due to #1 above 3. Generalized weakness - due to #1 above  DVT prophylaxis: SCDs - IR LP in AM Code Status: Full Family Communication: Son at bedside Disposition Plan: Home after admit Consults called: Spoke with Dr. Leonel Ramsay on  phone Admission status: Admit to inpatient   Etta Quill DO [facility] Pager [phone] Only works nights!  If 7AM-7PM, please contact the primary day team physician taking care of patient  [email or web address] Password [facility]  03/26/2018, 10:38 PM

## 2018-03-26 NOTE — Progress Notes (Signed)
Pharmacy Antibiotic Note  Katelyn Lamb is a 72 y.o. female admitted on 03/26/2018 with HSV-2 meningitis vs encephalitis/ UTI.  Pharmacy has been consulted for acyclovir and levofloxacin dosing.  Plan:  Levofloxacin 750 mg IV x1 in ED, then 500 mg IV q24h  Acyclovir 600 mg IV q8h   Height: 5\' 6"  (167.6 cm) Weight: 134 lb (60.8 kg) IBW/kg (Calculated) : 59.3  Temp (24hrs), Avg:100.7 F (38.2 C), Min:99.7 F (37.6 C), Max:101.7 F (38.7 C)  Recent Labs  Lab 03/26/18 1926 03/26/18 1934  WBC 19.5*  --   CREATININE 0.91  --   LATICACIDVEN  --  2.36*    Estimated Creatinine Clearance: 53.9 mL/min (by C-G formula based on SCr of 0.91 mg/dL).    Allergies  Allergen Reactions  . Demerol [Meperidine] Other (See Comments)    Hallucinations  . Percocet [Oxycodone-Acetaminophen] Itching  . Amoxicillin-Pot Clavulanate Other (See Comments)    Severe pain, headache, intestinal infection  . Penicillins Itching and Rash    Has patient had a PCN reaction causing immediate rash, facial/tongue/throat swelling, SOB or lightheadedness with hypotension: Yes Has patient had a PCN reaction causing severe rash involving mucus membranes or skin necrosis: No Has patient had a PCN reaction that required hospitalization: No Has patient had a PCN reaction occurring within the last 10 years: Yes If all of the above answers are "NO", then may proceed with Cephalosporin use.    Antimicrobials this admission: 7/24 aztreonam x 1  7/24 vancomycin x1   7/24 levofloxacin >>  7/24 IV acyclovir >>  Dose adjustments this admission: ---  Microbiology results: 7/24 BCx:  7/24 UCx:     Thank you for allowing pharmacy to be a part of this patient's care.  Royetta Asal, PharmD, BCPS Pager [phone] 03/26/2018 10:03 PM

## 2018-03-26 NOTE — ED Notes (Signed)
Bed: WA02 Expected date:  Expected time:  Means of arrival:  Comments: 

## 2018-03-26 NOTE — Telephone Encounter (Signed)
Pts husband [name] requesting a call, stating the pt started a new antibiotic last night for a UTI. Once the pt woke up this morning she was unable to walk. [name] states "her legs are like noodles" Nothing new beside this medication, unclear if this is a reaction between the medications. [name] states the antibiotic is "Mitrofurantoin-100 MG"(per spelling)   Please call to advise.

## 2018-03-26 NOTE — Telephone Encounter (Signed)
I called the husband. The patient started macrodantin for a UTI yesterday. By early afternoon today, she has had worsening of gait. She is not running a fever. I do not see any obvious interactions with her current medications, but she may need to stop her macrodantin and go on another antibiotic.  If the walking does not improve,she may require further scanning procedures. She will be seen here on 8.7.19.

## 2018-03-26 NOTE — ED Triage Notes (Signed)
Patient BIB EMS for fever and generalized weakness that began today. Patient was diagnosed with a UTI and began antibiotics yesterday.   Vitals 108/70 P 110 RR 20 SpO2 89% RA, 94% 4L Temp 101.8 oral  Patient given 1000mg  tylenol by EMS

## 2018-03-26 NOTE — ED Notes (Signed)
ED TO INPATIENT HANDOFF REPORT  Name/Age/Gender Katelyn Lamb 72 y.o. female  Code Status    Code Status Orders  (From admission, onward)        Start     Ordered   03/26/18 2237  Full code  Continuous     03/26/18 2237    Code Status History    Date Active Date Inactive Code Status Order ID Comments User Context   10/08/2016 1801 10/26/2016 1548 Full Code [id number]  Elizabeth Sauer Inpatient   10/08/2016 1801 10/08/2016 1801 Full Code [id number]  Elizabeth Sauer Inpatient   10/03/2016 0251 10/08/2016 1800 Full Code [id number]  Norval Morton, MD ED   09/20/2016 2106 09/26/2016 2030 Full Code [id number]  Reubin Milan, MD Inpatient      Home/SNF/Other Home  Chief Complaint Fever; Weakness  Level of Care/Admitting Diagnosis ED Disposition    ED Disposition Condition [place]: [facility] [100102]  Level of Care: Med-Surg [16]  Diagnosis: Delirium [414239]  Admitting Physician: Etta Quill [[id number]]  Attending Physician: Etta Quill [[id number]]  Estimated length of stay: past midnight tomorrow  Certification:: I certify this patient will need inpatient services for at least 2 midnights  PT Class (Do Not Modify): Inpatient [101]  PT Acc Code (Do Not Modify): Private [1]       Medical History Past Medical History:  Diagnosis Date  . Anxiety   . Back pain   . Gait abnormality 11/14/2016  . 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 Demerol [Meperidine] Other (See Comments)    Hallucinations  . Percocet [Oxycodone-Acetaminophen] Itching  . Amoxicillin-Pot Clavulanate Other (See Comments)    Severe pain, headache, intestinal infection  . Penicillins Itching and Rash    Has patient had a PCN reaction causing immediate rash, facial/tongue/throat swelling, SOB or lightheadedness with hypotension: Yes Has patient had a PCN reaction causing severe rash involving mucus  membranes or skin necrosis: No Has patient had a PCN reaction that required hospitalization: No Has patient had a PCN reaction occurring within the last 10 years: Yes If all of the above answers are "NO", then may proceed with Cephalosporin use.    IV Location/Drains/Wounds Patient Lines/Drains/Airways Status   Active Line/Drains/Airways    Name:   Placement date:   Placement time:   Site:   Days:   Peripheral IV 03/26/18 Right Wrist   03/26/18    2009    Wrist   less than 1   Peripheral IV 03/26/18 Right Arm   03/26/18    2010    Arm   less than 1          Labs/Imaging Results for orders placed or performed during the hospital encounter of 03/26/18 (from the past 48 hour(s))  Comprehensive metabolic panel     Status: Abnormal   Collection Time: 03/26/18  7:26 PM  Result Value Ref Range   Sodium 136 135 - 145 mmol/L   Potassium 4.1 3.5 - 5.1 mmol/L   Chloride 100 98 - 111 mmol/L   CO2 26 22 - 32 mmol/L   Glucose, Bld 150 (H) 70 - 99 mg/dL   BUN 14 8 - 23 mg/dL   Creatinine, Ser 0.91 0.44 - 1.00 mg/dL   Calcium 9.0 8.9 - 10.3 mg/dL   Total Protein 7.2 6.5 - 8.1 g/dL   Albumin 3.7 3.5 - 5.0  g/dL   AST 31 15 - 41 U/L   ALT 18 0 - 44 U/L   Alkaline Phosphatase 122 38 - 126 U/L   Total Bilirubin 0.4 0.3 - 1.2 mg/dL   GFR calc non Af Amer >60 >60 mL/min   GFR calc Af Amer >60 >60 mL/min    Comment: (NOTE) The eGFR has been calculated using the CKD EPI equation. This calculation has not been validated in all clinical situations. eGFR's persistently <60 mL/min signify possible Chronic Kidney Disease.    Anion gap 10 5 - 15    Comment: Performed at [facility], [place] [address] [place], [place] [address]  CBC with Differential     Status: Abnormal   Collection Time: 03/26/18  7:26 PM  Result Value Ref Range   WBC 19.5 (H) 4.0 - 10.5 K/uL   RBC 4.22 3.87 - 5.11 MIL/uL   Hemoglobin 12.7 12.0 - 15.0 g/dL   HCT 40.0 36.0 - 46.0 %   MCV 94.8 78.0 - 100.0 fL    MCH 30.1 26.0 - 34.0 pg   MCHC 31.8 30.0 - 36.0 g/dL   RDW 14.3 11.5 - 15.5 %   Platelets 231 150 - 400 K/uL   Neutrophils Relative % 88 %   Neutro Abs 17.2 (H) 1.7 - 7.7 K/uL   Lymphocytes Relative 7 %   Lymphs Abs 1.4 0.7 - 4.0 K/uL   Monocytes Relative 4 %   Monocytes Absolute 0.8 0.1 - 1.0 K/uL   Eosinophils Relative 1 %   Eosinophils Absolute 0.2 0.0 - 0.7 K/uL   Basophils Relative 0 %   Basophils Absolute 0.0 0.0 - 0.1 K/uL    Comment: Performed at [facility], [place] [address], [place], [place] [address]  Protime-INR     Status: None   Collection Time: 03/26/18  7:26 PM  Result Value Ref Range   Prothrombin Time 13.7 11.4 - 15.2 seconds   INR 1.06     Comment: Performed at [facility], [place] [address] [place], [place] [address]  I-Stat CG4 Lactic Acid, ED     Status: Abnormal   Collection Time: 03/26/18  7:34 PM  Result Value Ref Range   Lactic Acid, Venous 2.36 (HH) 0.5 - 1.9 mmol/L   Comment NOTIFIED PHYSICIAN   Urinalysis, Routine w reflex microscopic     Status: None   Collection Time: 03/26/18  8:19 PM  Result Value Ref Range   Color, Urine YELLOW YELLOW   APPearance CLEAR CLEAR   Specific Gravity, Urine 1.005 1.005 - 1.030   pH 8.0 5.0 - 8.0   Glucose, UA NEGATIVE NEGATIVE mg/dL   Hgb urine dipstick NEGATIVE NEGATIVE   Bilirubin Urine NEGATIVE NEGATIVE   Ketones, ur NEGATIVE NEGATIVE mg/dL   Protein, ur NEGATIVE NEGATIVE mg/dL   Nitrite NEGATIVE NEGATIVE   Leukocytes, UA NEGATIVE NEGATIVE    Comment: Performed at [place] [address] [place], [place] [address]  I-Stat CG4 Lactic Acid, ED     Status: None   Collection Time: 03/26/18 10:08 PM  Result Value Ref Range   Lactic Acid, Venous 1.71 0.5 - 1.9 mmol/L   Dg Chest Port 1 View  Result Date: 03/26/2018 CLINICAL DATA:  Fever, altered mental status and generalized weakness today. EXAM: PORTABLE CHEST 1 VIEW COMPARISON:  Chest x-ray dated  05/19/2014. FINDINGS: Heart size and mediastinal contours are within normal limits. Lungs are clear. No pleural effusion or pneumothorax seen.  Osseous structures about the chest are unremarkable. IMPRESSION: No active disease.  No evidence of pneumonia or pulmonary edema. Electronically Signed   By: Franki Cabot M.D.   On: 03/26/2018 20:48    Pending Labs Unresulted Labs (From admission, onward)   Start     Ordered   03/27/18 0500  CBC  Tomorrow morning,   R     03/26/18 2237   03/27/18 8616  Basic metabolic panel  Tomorrow morning,   R     03/26/18 2237   03/26/18 2237  HIV antibody (Routine Testing)  Once,   R     03/26/18 2237   03/26/18 2213  Herpes simplex virus (HSV), DNA by PCR Cerebrospinal Fluid  Once,   R     03/26/18 2213   03/26/18 1936  Urine culture  STAT,   STAT     03/26/18 1938   03/26/18 1926  Culture, blood (Routine x 2)  BLOOD CULTURE X 2,   STAT     03/26/18 1926      Vitals/Pain Today's Vitals   03/26/18 2030 03/26/18 2100 03/26/18 2104 03/26/18 2131  BP: 107/75 107/69  92/77  Pulse: 95 91  91  Resp: 14 20  (!) 25  Temp:   99.7 F (37.6 C)   TempSrc:   Oral   SpO2: 91% 94%  99%  Weight:      Height:      PainSc:        Isolation Precautions No active isolations  Medications Medications  sodium chloride 0.9 % bolus 500 mL (has no administration in time range)  levofloxacin (LEVAQUIN) IVPB 500 mg (has no administration in time range)  acyclovir (ZOVIRAX) 600 mg in dextrose 5 % 100 mL IVPB (has no administration in time range)  acetaminophen (TYLENOL) tablet 650 mg (has no administration in time range)    Or  acetaminophen (TYLENOL) suppository 650 mg (has no administration in time range)  ondansetron (ZOFRAN) tablet 4 mg (has no administration in time range)    Or  ondansetron (ZOFRAN) injection 4 mg (has no administration in time range)  0.9 %  sodium chloride infusion (has no administration in time range)  diazepam (VALIUM) tablet 5 mg (has no  administration in time range)  HYDROcodone-acetaminophen (NORCO) 10-325 MG per tablet 1 tablet (has no administration in time range)  pantoprazole (PROTONIX) EC tablet 40 mg (has no administration in time range)  polyethylene glycol (MIRALAX / GLYCOLAX) packet 17 g (has no administration in time range)  pregabalin (LYRICA) capsule 50-100 mg (has no administration in time range)  DULoxetine (CYMBALTA) DR capsule 60 mg (has no administration in time range)  diclofenac sodium (VOLTAREN) 1 % transdermal gel 2 g (has no administration in time range)  levofloxacin (LEVAQUIN) IVPB 750 mg (750 mg Intravenous New Bag/Given 03/26/18 2029)  aztreonam (AZACTAM) 2 g in sodium chloride 0.9 % 100 mL IVPB (0 g Intravenous Stopped 03/26/18 2125)  vancomycin (VANCOCIN) IVPB 1000 mg/200 mL premix (1,000 mg Intravenous New Bag/Given 03/26/18 2134)    Mobility Has not walked in ED

## 2018-03-26 NOTE — ED Provider Notes (Signed)
[facility] DEPT Provider Note   CSN: [id number] Arrival date & time: 03/26/18  1607     History   Chief Complaint No chief complaint on file.   HPI Katelyn Lamb is a 72 y.o. female with a past medical history of myelitis secondary to herpes simplex, encephalomyelitis, gait abnormality, who presents today for evaluation of fever and generalized weakness.  She reports that for the past week she has been having increased urinary urgency, frequency and dysuria.  She was seen yesterday by her primary care provider and started on Macrobid for a presumed UTI.  She reports that she took 1 dose of that yesterday, has not had any today.  She reports generally not feeling well.  EMS found that she was mildly hypoxic on room air with a sat of 98%, she is satting 94% on 2 L nasal cannula.  EMS also gave patient 1000 mg of Tylenol for her fever.  Patient denies any new pains.  Family reports that patient is back to her baseline mentally.  Patient reports that she had weakness earlier that has resolved fully back to her base line.   HPI  Past Medical History:  Diagnosis Date  . Anxiety   . Back pain   . Gait abnormality 11/14/2016  . Myelitis due to herpes simplex [facility])     Patient Active Problem List   Diagnosis Date Noted  . Medication monitoring encounter 01/08/2017  . Gait abnormality 11/14/2016  . Hypotension due to drugs   . Urinary retention   . Anxiety about health   . Reactive depression   . Ataxia   . Abdominal spasms   . Constipation due to pain medication   . Acute lower UTI   . Dysuria   . Acute deep vein thrombosis (DVT) of popliteal vein of left lower extremity ([place])   . Incomplete paraplegia ([place])   . Acute blood loss anemia   . Neurogenic bladder   . Neuropathic pain   . Muscle spasm   . Gastroesophageal reflux disease   . Slow transit constipation   . Thrombocytopenia ([place]) 10/03/2016  . Abnormal MRI, spinal cord   .  Encephalomyelitis   . Numbness   . Intractable back pain 09/20/2016  . Numbness of left lower extremity 09/20/2016  . Hyponatremia 09/20/2016  . Herpes zoster without complication 37/06/6268    Past Surgical History:  Procedure Laterality Date  . ABDOMINAL HYSTERECTOMY    . BLADDER REPAIR    . CESAREAN SECTION    . TUBAL LIGATION       OB History   None      Home Medications    Prior to Admission medications   Medication Sig Start Date End Date Taking? Authorizing Provider  acyclovir (ZOVIRAX) 400 MG tablet TAKE 1 TABLET BY MOUTH TWICE A DAY 01/24/18  Yes [name], MD  Biotin 10000 MCG TABS Take 1 tablet by mouth daily.   Yes [provider]  Calcium Carb-Cholecalciferol (CALCIUM+D3 PO) Take 1 tablet by mouth 2 (two) times daily. 1200 mg calcium +D3    Yes [provider]  Cyanocobalamin (VITAMIN B12 PO) Take 1 tablet by mouth daily.   Yes [provider]  diazepam (VALIUM) 5 MG tablet Take 5 mg by mouth 2 (two) times daily.   Yes [provider]  diclofenac sodium (VOLTAREN) 1 % GEL APPLY 2 GRAM TO THE AFFECTED AREA(S) 4 TIMES PER DAY 02/10/18  Yes [provider]  DULoxetine (  CYMBALTA) 60 MG capsule TAKE 1 CAPSULE (60 MG TOTAL) BY MOUTH 2 (TWO) TIMES DAILY. 03/25/18  Yes [name], MD  HYDROcodone-acetaminophen [facility]) 10-325 MG tablet Take 1 tablet by mouth 3 (three) times daily as needed for severe pain.  03/07/18  Yes [provider]  nitrofurantoin, macrocrystal-monohydrate, (MACROBID) 100 MG capsule Take 100 mg by mouth 2 (two) times daily.  03/25/18  Yes [provider]  omega-3 acid ethyl esters (LOVAZA) 1 g capsule Take 1 capsule (1 g total) by mouth 2 (two) times daily. 10/26/16  Yes Angiulli, Lavon Paganini, PA-C  pantoprazole (PROTONIX) 40 MG tablet Take 1 tablet (40 mg total) by mouth daily. 10/26/16  Yes Angiulli, Lavon Paganini, PA-C  pregabalin (LYRICA) 50 MG capsule TAKE 1 CAPSULE IN THE MORNING AND 2  CAPSULES IN THE EVENING 01/31/18  Yes [name], MD  ALPRAZolam Duanne Moron) 1 MG tablet Take 1 mg by mouth at bedtime as needed for anxiety or sleep.  10/26/16   [provider]  conjugated estrogens (PREMARIN) vaginal cream Place 1 Applicatorful vaginally daily as needed (itching).     [provider]  HYDROcodone-acetaminophen (NORCO) 5-325 MG tablet Take 1-2 tablets by mouth every 4 (four) hours as needed for moderate pain. Patient not taking: Reported on 03/26/2018 04/27/17   Duffy Bruce, MD  Menthol, Topical Analgesic, (STOPAIN EX) Apply 1 application topically See admin instructions. Spray topically at bedtime as needed for pain    [provider]  polyethylene glycol (MIRALAX / GLYCOLAX) packet Take 17 g by mouth daily. Patient taking differently: Take 17 g by mouth daily as needed (constipation).  10/26/16   Angiulli, Lavon Paganini, PA-C  promethazine (PHENERGAN) 25 MG suppository Place 25 mg rectally every 12 (twelve) hours as needed for nausea or vomiting (diarrha).  04/23/17   [provider]    Family History Family History  Problem Relation Age of Onset  . Hypertension Mother     Social History Social History   Tobacco Use  . Smoking status: Former Research scientist (life sciences)  . Smokeless tobacco: Never Used  Substance Use Topics  . Alcohol use: No  . Drug use: No     Allergies   Demerol [meperidine]; Percocet [oxycodone-acetaminophen]; Amoxicillin-pot clavulanate; and Penicillins   Review of Systems Review of Systems  Constitutional: Positive for fever. Negative for chills.  Eyes: Negative for visual disturbance.  Respiratory: Negative for chest tightness and shortness of breath.   Cardiovascular: Negative for chest pain.  Gastrointestinal: Negative for abdominal pain.  Genitourinary: Positive for dysuria. Negative for decreased urine volume, flank pain, frequency and urgency.  Musculoskeletal: Negative for arthralgias, back pain, neck pain and neck  stiffness.  Neurological: Positive for headaches. Negative for dizziness, facial asymmetry, speech difficulty and weakness.  Psychiatric/Behavioral: Negative for confusion.  All other systems reviewed and are negative.    Physical Exam Updated Vital Signs BP 92/77 Comment: 2L O2  Pulse 91   Temp 99.7 F (37.6 C) (Oral)   Resp (!) 25 Comment: 2L O2  Ht 5\' 6"  (1.676 m)   Wt 60.8 kg (134 lb)   SpO2 99%   BMI 21.63 kg/m   Physical Exam  Constitutional: She appears well-developed and well-nourished. No distress.  HENT:  Head: Normocephalic and atraumatic.  Eyes: Conjunctivae are normal. Right eye exhibits no discharge. Left eye exhibits no discharge. No scleral icterus.  Neck: Normal range of motion.  Cardiovascular: Regular rhythm, normal heart sounds, intact distal pulses and normal pulses. Tachycardia present.  Pulmonary/Chest: Effort normal. No stridor. No respiratory distress.  Abdominal: She exhibits no distension.  Musculoskeletal: She exhibits no edema or deformity.  Neurological: She is alert. She exhibits normal muscle tone.  Skin: Skin is warm and dry. She is not diaphoretic.  Psychiatric: She has a normal mood and affect. Her behavior is normal.  Nursing note and vitals reviewed.    ED Treatments / Results  Labs (all labs ordered are listed, but only abnormal results are displayed) Labs Reviewed  COMPREHENSIVE METABOLIC PANEL - Abnormal; Notable for the following components:      Result Value   Glucose, Bld 150 (*)    All other components within normal limits  CBC WITH DIFFERENTIAL/PLATELET - Abnormal; Notable for the following components:   WBC 19.5 (*)    Neutro Abs 17.2 (*)    All other components within normal limits  I-STAT CG4 LACTIC ACID, ED - Abnormal; Notable for the following components:   Lactic Acid, Venous 2.36 (*)    All other components within normal limits  CULTURE, BLOOD (ROUTINE X 2)  CULTURE, BLOOD (ROUTINE X 2)  URINE CULTURE    PROTIME-INR  URINALYSIS, ROUTINE W REFLEX MICROSCOPIC  HERPES SIMPLEX VIRUS(HSV) DNA BY PCR  I-STAT CG4 LACTIC ACID, ED    EKG EKG Interpretation  Date/Time:  Wednesday March 26 2018 19:12:40 EDT Ventricular Rate:  107 PR Interval:    QRS Duration: 80 QT Interval:  340 QTC Calculation: 454 R Axis:   126 Text Interpretation:  Sinus tachycardia Low voltage with right axis deviation Consider anterior infarct SINCE LAST TRACING HEART RATE HAS INCREASED Confirmed by Malvin Johns [phone]) on 03/26/2018 8:30:06 PM   Radiology Dg Chest Port 1 View  Result Date: 03/26/2018 CLINICAL DATA:  Fever, altered mental status and generalized weakness today. EXAM: PORTABLE CHEST 1 VIEW COMPARISON:  Chest x-ray dated 05/19/2014. FINDINGS: Heart size and mediastinal contours are within normal limits. Lungs are clear. No pleural effusion or pneumothorax seen. Osseous structures about the chest are unremarkable. IMPRESSION: No active disease.  No evidence of pneumonia or pulmonary edema. Electronically Signed   By: Franki Cabot M.D.   On: 03/26/2018 20:48    Procedures Procedures  CRITICAL CARE Performed by: Wyn Quaker Total critical care time: 40 minutes Critical care time was exclusive of separately billable procedures and treating other patients. Critical care was necessary to treat or prevent imminent or life-threatening deterioration. Critical care was time spent personally by me on the following activities: development of treatment plan with patient and/or surrogate as well as nursing, discussions with consultants, evaluation of patient's response to treatment, examination of patient, obtaining history from patient or surrogate, ordering and performing treatments and interventions, ordering and review of laboratory studies, ordering and review of radiographic studies, pulse oximetry and re-evaluation of patient's condition.  Sepsis requiring 3 antibiotics.     Medications Ordered in  ED Medications  vancomycin (VANCOCIN) IVPB 1000 mg/200 mL premix (1,000 mg Intravenous New Bag/Given 03/26/18 2134)  sodium chloride 0.9 % bolus 500 mL (has no administration in time range)  levofloxacin (LEVAQUIN) IVPB 500 mg (has no administration in time range)  acyclovir (ZOVIRAX) 600 mg in dextrose 5 % 100 mL IVPB (has no administration in time range)  levofloxacin (LEVAQUIN) IVPB 750 mg (750 mg Intravenous New Bag/Given 03/26/18 2029)  aztreonam (AZACTAM) 2 g in sodium chloride 0.9 % 100 mL IVPB (0 g Intravenous Stopped 03/26/18 2125)     Initial Impression / Assessment and Plan / ED Course  I have reviewed the triage vital signs and the nursing notes.  Pertinent labs & imaging results that were available during my care of the patient were reviewed by me and considered in my medical decision making (see chart for details).  Clinical Course as of Mar 26 2216  Wed Mar 26, 2018  2054 Sepsis re-evaluation completed.  Patient appears to be feeling better   [EH]    Clinical Course User Index [EH] Ollen Gross    Remo Lipps I Goynes presents today for evaluation of fever and feeling weak.  She is previously been seen by [facility] who reportedly got a urine culture that was consistent with infection.  Those records are unable to be reviewed today.  She was started on Macrobid for this.  She reportedly took 1 or 2 doses of it yesterday, none today.  Upon arrival she is meeting sepsis criteria, she is tachycardic, tachypneic, and febrile.  Initial lactic elevated at 2.3.  White count elevated at 19.5 with neutrophils of 17.2, which I interpret is consistent with an infection, suspect bacterial.  Urine is not consistent with clear infection with no blood, no ketones, no protein, no nitrites, no leukocytes.    Blood cultures were obtained.  She is penicillin allergic, therefore was started on azatactam, levofloxacin, and vancomycin.  X-ray was obtained without acute  abnormalities.  Hospitalist was consulted for admission who consulted neurology and agreed for admission.    This patient was seen as a shared visit with Dr. Tamera Punt.   Final Clinical Impressions(s) / ED Diagnoses   Final diagnoses:  Sepsis, due to unspecified organism [facility])  Urinary tract infection without hematuria, site unspecified    ED Discharge Orders    None       Ollen Gross 03/26/18 2223    Malvin Johns, MD 03/26/18 2324

## 2018-03-27 ENCOUNTER — Other Ambulatory Visit: Payer: Self-pay

## 2018-03-27 ENCOUNTER — Inpatient Hospital Stay (HOSPITAL_COMMUNITY): Payer: PPO

## 2018-03-27 LAB — BASIC METABOLIC PANEL
ANION GAP: 6 (ref 5–15)
BUN: 12 mg/dL (ref 8–23)
CHLORIDE: 107 mmol/L (ref 98–111)
CO2: 27 mmol/L (ref 22–32)
Calcium: 8.3 mg/dL — ABNORMAL LOW (ref 8.9–10.3)
Creatinine, Ser: 0.76 mg/dL (ref 0.44–1.00)
GFR calc Af Amer: >60 mL/min (ref >60)
GFR calc non Af Amer: >60 mL/min (ref >60)
GLUCOSE: 103 mg/dL — AB (ref 70–99)
POTASSIUM: 3.8 mmol/L (ref 3.5–5.1)
Sodium: 140 mmol/L (ref 135–145)

## 2018-03-27 LAB — CBC
HEMATOCRIT: 34.4 % — AB (ref 36.0–46.0)
HEMOGLOBIN: 10.9 g/dL — AB (ref 12.0–15.0)
MCH: 30.1 pg (ref 26.0–34.0)
MCHC: 31.7 g/dL (ref 30.0–36.0)
MCV: 95 fL (ref 78.0–100.0)
Platelets: 217 10*3/uL (ref 150–400)
RBC: 3.62 MIL/uL — AB (ref 3.87–5.11)
RDW: 14.6 % (ref 11.5–15.5)
WBC: 15.6 10*3/uL — AB (ref 4.0–10.5)

## 2018-03-27 LAB — CSF CELL COUNT WITH DIFFERENTIAL
RBC COUNT CSF: 0 /mm3
TUBE #: 4
WBC, CSF: 1 /mm3 (ref 0–5)

## 2018-03-27 LAB — PROTEIN, CSF: Total  Protein, CSF: 29 mg/dL (ref 15–45)

## 2018-03-27 LAB — GLUCOSE, CSF: Glucose, CSF: 61 mg/dL (ref 40–70)

## 2018-03-27 LAB — HIV ANTIBODY (ROUTINE TESTING W REFLEX): HIV Screen 4th Generation wRfx: NONREACTIVE

## 2018-03-27 MED ORDER — LIDOCAINE HCL 1 % IJ SOLN
INTRAMUSCULAR | Status: AC
  Filled 2018-03-27: qty 20

## 2018-03-27 MED ORDER — PREGABALIN 50 MG PO CAPS
50.0000 mg | ORAL_CAPSULE | Freq: Every day | ORAL | Status: DC
  Administered 2018-03-27 – 2018-03-29 (×3): 50 mg via ORAL
  Filled 2018-03-27 (×4): qty 1

## 2018-03-27 NOTE — Progress Notes (Addendum)
Dr Herbert Moors notified of BP 89/67.No orders except to monitor.  Writer received  A call from [place] in the lab with a report on the spinal fluid as follows" no WBC"s no organisms  Seen.

## 2018-03-27 NOTE — Telephone Encounter (Signed)
Pts husband requesting a call, stating the pt is currently in the hospital. Doctors there are wanting to do an LP, pt feels uncomfortable with doing an LP without speaking with Dr. Jannifer Franklin. Please call to advise.

## 2018-03-27 NOTE — Progress Notes (Signed)
Pt requested to do admission documentation in the morning because she was tired and wanted to rest.

## 2018-03-27 NOTE — Progress Notes (Signed)
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  She is followed at [facility] for this.  She reports compliance with her acyclovir however she is quite hesitant to get a lumbar puncture.  Her last MRI on 7 08/2018 of the cervical and brain showed no significant acute changes and perhaps some improvement of the MRI cervical spine however the brain MRI showed extensive T2/FLAIR foci in both lobes especially the right that is increased somewhat worse the rest of the changes are stable.  Fortunately she has no evidence of meningismus at this time making the likelihood of bacterial meningitis less likely. - Continue acyclovir IV, hold home acyclovir -We will attempt lumbar puncture today - Follow-up blood culture and urine culture ordered 03/26/2018 -Gentle IV fluids -We will discuss with neurology  #) Altered mental status: This appears to have resolved with treatment  #)pain/psych: Much of her debility secondary to her lesions from this chronic encephalomyelitis. - Continue alprazolam 1 mg nightly as needed -Continue biotin supplementation -Continue Valium 5 mill grams twice daily -Continue duloxetine 60 mg twice daily -Continue PRN Norco - Continue pregabalin 50 mg every morning and 100 mg nightly  #) Recurrent UTI: Unlikely be causing such  profound symptomatology. -Discontinue levofloxacin due to risk of altered mental status in the elderly - Follow-up urine culture from 03/26/2018  Fluids: Gentle IV fluids Electrolytes: Monitor and supplement Nutrition: Regular diet  Prophylaxis: SCDs  Disposition: Pending evaluation of fever and lumbar puncture  Full code  Consultants:   Neurology  Interventional radiology  Procedures:   Lumbar puncture to be attempted on 03/27/2018  Antimicrobials:   IV acyclovir started 03/26/2018  IV levofloxacin from 03/26/2018 to 03/27/2018   Subjective: Patient reports she is doing much better.  She denies any pain but continues to have fairly significant weakness.  She denies any nausea, vomiting, diarrhea.  She is quite hesitant to do the lumbar puncture she is afraid of the discomfort involved.    Objective: Vitals:   03/26/18 2104 03/26/18 2131 03/27/18 0006 03/27/18 0009  BP:  92/77  (!) 85/70  Pulse:  91  84  Resp:  (!) 25  16  Temp: 99.7 F (37.6 C)   98.8 F (37.1 C)  TempSrc: Oral   Oral  SpO2:  99%  95%  Weight:   65.2 kg (143 lb 11.8 oz)   Height:        Intake/Output Summary (Last 24 hours) at 03/27/2018 1259 Last data filed at 03/27/2018 1025 Gross per 24 hour  Intake 1179.7 ml  Output 1000 ml  Net 179.7 ml   Filed Weights   03/26/18 1922 03/27/18 0006  Weight: 60.8 kg (134 lb) 65.2 kg (143 lb 11.8 oz)    Examination:  General exam: Appears calm and comfortable  Respiratory system: Clear to auscultation. Respiratory effort normal. Cardiovascular system: Regular rate and rhythm, no murmurs  Gastrointestinal system: Abdomen is nondistended, soft and nontender. No organomegaly or masses felt. Normal bowel sounds heard. Central nervous system: Alert and oriented.  Bilateral lower extremity weakness 4 out of 5, diminished sensation, no meningismus Extremities: No lower extremity edema Skin: No rashes on visible skin Psychiatry: Judgement and insight appear  normal. Mood & affect appropriate.     Data Reviewed: I have personally reviewed following labs and imaging studies  CBC: Recent Labs  Lab 03/26/18 1926 03/27/18 0415  WBC 19.5* 15.6*  NEUTROABS 17.2*  --   HGB 12.7 10.9*  HCT 40.0 34.4*  MCV 94.8 95.0  PLT 231 621   Basic Metabolic Panel: Recent Labs  Lab 03/26/18 1926 03/27/18 0415  NA 136 140  K 4.1 3.8  CL 100 107  CO2 26 27  GLUCOSE 150* 103*  BUN 14 12  CREATININE 0.91 0.76  CALCIUM 9.0 8.3*   GFR: Estimated Creatinine Clearance: 61.3 mL/min (by C-G formula based on SCr of 0.76 mg/dL). Liver Function Tests: Recent Labs  Lab 03/26/18 1926  AST 31  ALT 18  ALKPHOS 122  BILITOT 0.4  PROT 7.2  ALBUMIN 3.7   No results for input(s): LIPASE, AMYLASE in the last 168 hours. No results for input(s): AMMONIA in the last 168 hours. Coagulation Profile: Recent Labs  Lab 03/26/18 1926  INR 1.06   Cardiac Enzymes: No results for input(s): CKTOTAL, CKMB, CKMBINDEX, TROPONINI in the last 168 hours. BNP (last 3 results) No results for input(s): PROBNP in the last 8760 hours. HbA1C: No results for input(s): HGBA1C in the last 72 hours. CBG: No results for input(s): GLUCAP in the last 168 hours. Lipid Profile: No results for input(s): CHOL, HDL, LDLCALC, TRIG, CHOLHDL, LDLDIRECT in the last 72 hours. Thyroid Function Tests: No results for input(s): TSH, T4TOTAL, FREET4, T3FREE, THYROIDAB in the last 72 hours. Anemia Panel: No results for input(s): VITAMINB12, FOLATE, FERRITIN, TIBC, IRON, RETICCTPCT in the last 72 hours. Sepsis Labs: Recent Labs  Lab 03/26/18 1934 03/26/18 2208  LATICACIDVEN 2.36* 1.71    No results found for this or any previous visit (from the past 240 hour(s)).       Radiology Studies: Dg Chest Port 1 View  Result Date: 03/26/2018 CLINICAL DATA:  Fever, altered mental status and generalized weakness today. EXAM: PORTABLE CHEST 1 VIEW COMPARISON:  Chest x-ray dated 05/19/2014.  FINDINGS: Heart size and mediastinal contours are within normal limits. Lungs are clear. No pleural effusion or pneumothorax seen. Osseous structures about the chest are unremarkable. IMPRESSION: No active disease.  No evidence of pneumonia or pulmonary edema. Electronically Signed   By: Franki Cabot M.D.   On: 03/26/2018 20:48        Scheduled Meds: . diazepam  5 mg Oral BID  . diclofenac sodium  2 g Topical QID  . DULoxetine  60 mg Oral BID  . pantoprazole  40 mg Oral Daily  . pregabalin  100 mg Oral QHS  . pregabalin  50 mg Oral Daily   Continuous Infusions: . sodium chloride 75 mL/hr at 03/27/18 0013  . acyclovir 600 mg (03/27/18 0546)  . levofloxacin (LEVAQUIN) IV       LOS: 1 day    Time spent: [place], MD [facility]  If 7PM-7AM, please contact night-coverage [email or web address] Password [facility] 03/27/2018, 12:59 PM

## 2018-03-27 NOTE — Telephone Encounter (Signed)
I called the husband.  The patient is in the hospital with a fever of 101.8, significant elevated white blood count.  Lumbar puncture was recommended, this is reasonable.  The patient has had some confusion initially.  Any systemic infection may worsen her functional level as the husband reported.

## 2018-03-28 ENCOUNTER — Inpatient Hospital Stay (HOSPITAL_COMMUNITY): Payer: PPO

## 2018-03-28 LAB — HERPES SIMPLEX VIRUS(HSV) DNA BY PCR
HSV 1 DNA: NEGATIVE
HSV 2 DNA: NEGATIVE

## 2018-03-28 LAB — URINE CULTURE: Culture: <10000 — AB

## 2018-03-28 LAB — MAGNESIUM: Magnesium: 1.8 mg/dL (ref 1.7–2.4)

## 2018-03-28 LAB — CBC
HCT: 33.5 % — ABNORMAL LOW (ref 36.0–46.0)
Hemoglobin: 10.6 g/dL — ABNORMAL LOW (ref 12.0–15.0)
MCH: 30 pg (ref 26.0–34.0)
MCHC: 31.6 g/dL (ref 30.0–36.0)
MCV: 94.9 fL (ref 78.0–100.0)
Platelets: 213 K/uL (ref 150–400)
RBC: 3.53 MIL/uL — ABNORMAL LOW (ref 3.87–5.11)
RDW: 14.6 % (ref 11.5–15.5)
WBC: 8.3 K/uL (ref 4.0–10.5)

## 2018-03-28 LAB — BASIC METABOLIC PANEL
BUN: 12 mg/dL (ref 8–23)
CO2: 26 mmol/L (ref 22–32)
Chloride: 108 mmol/L (ref 98–111)
Creatinine, Ser: 0.8 mg/dL (ref 0.44–1.00)
Glucose, Bld: 90 mg/dL (ref 70–99)
Potassium: 3.7 mmol/L (ref 3.5–5.1)
Sodium: 141 mmol/L (ref 135–145)

## 2018-03-28 LAB — BASIC METABOLIC PANEL WITH GFR
Anion gap: 7 (ref 5–15)
Calcium: 8.7 mg/dL — ABNORMAL LOW (ref 8.9–10.3)
GFR calc Af Amer: >60 mL/min (ref >60)
GFR calc non Af Amer: >60 mL/min (ref >60)

## 2018-03-28 MED ORDER — DIPHENHYDRAMINE HCL 50 MG/ML IJ SOLN: 50.0000 mg | Freq: Once | INTRAMUSCULAR | Status: DC

## 2018-03-28 MED ORDER — CIPROFLOXACIN HCL 500 MG PO TABS
500.0000 mg | ORAL_TABLET | Freq: Two times a day (BID) | ORAL | Status: DC
  Administered 2018-03-28 – 2018-03-29 (×2): 500 mg via ORAL
  Filled 2018-03-28 (×2): qty 1

## 2018-03-28 MED ORDER — ACYCLOVIR 400 MG PO TABS
400.0000 mg | ORAL_TABLET | Freq: Two times a day (BID) | ORAL | Status: DC
  Administered 2018-03-29: 400 mg via ORAL
  Filled 2018-03-28: qty 1

## 2018-03-28 MED ORDER — SODIUM CHLORIDE 0.9 % IV SOLN
2.0000 g | INTRAVENOUS | Status: DC
  Administered 2018-03-28: 2 g via INTRAVENOUS
  Filled 2018-03-28: qty 20
  Filled 2018-03-28: qty 2

## 2018-03-28 MED ORDER — METHYLPREDNISOLONE SODIUM SUCC 125 MG IJ SOLR: 125.0000 mg | Freq: Once | INTRAMUSCULAR | Status: DC

## 2018-03-28 MED ORDER — FAMOTIDINE IN NACL 20-0.9 MG/50ML-% IV SOLN
20.0000 mg | Freq: Once | INTRAVENOUS | Status: AC
  Administered 2018-03-28: 20 mg via INTRAVENOUS
  Filled 2018-03-28: qty 50

## 2018-03-28 NOTE — Progress Notes (Signed)
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  CSF was extremely reassuring.  Suspect most likely due to UTI as patient was noted to have UTI as an outpatient - Discontinue acyclovir IV, transition to home acyclovir 400 mg twice daily -CSF culture and HSV PCR CSF pending -Protein normal, glucose normal, cell count reassuring from CSF - blood culture and urine culture ordered 03/26/2018 no growth to date -Start ciprofloxacin based on culture growing out E. coli and viridans group strep  #) Altered mental status: This appears to have resolved   #)pain/psych: Much of her debility secondary to her lesions from this chronic encephalomyelitis. - Continue alprazolam 1 mg nightly as needed -Continue biotin supplementation -Continue Valium 5 mill grams twice daily -Continue duloxetine 60 mg twice daily -Continue PRN Norco - Continue pregabalin 50 mg every morning and 100 mg nightly   Fluids: Gentle IV fluids Electrolytes: Monitor and supplement Nutrition: Regular diet  Prophylaxis: SCDs  Disposition: Pending evaluation of fever and lumbar puncture  Full code  Consultants:   Neurology  Interventional radiology  Procedures:   Lumbar puncture  on 03/27/2018  Antimicrobials:   IV acyclovir started 03/26/2018 to 03/28/2018  Home p.o.  acyclovir 400 mg twice daily  IV levofloxacin from 03/26/2018 to 03/27/2018   Subjective: Patient reports that she is doing significantly better.  She denies any nausea, vomiting, diarrhea.  She is ambulated to a bedside commode.  Her husband today brought in urine culture growing E. coli and viridans group strep.  Objective: Vitals:   03/27/18 1420 03/27/18 2221 03/28/18 0611 03/28/18 1101  BP: (!) 89/67 98/71 114/76 95/72  Pulse: 80 87 82 86  Resp: 14 20 16 18   Temp: 98.2 F (36.8 C) 98.1 F (36.7 C) 98.5 F (36.9 C) 98.1 F (36.7 C)  TempSrc: Oral Oral Oral Oral  SpO2: 100% 95% 96% 94%  Weight:      Height:        Intake/Output Summary (Last 24 hours) at 03/28/2018 1429 Last data filed at 03/28/2018 0640 Gross per 24 hour  Intake -  Output 1750 ml  Net -1750 ml   Filed Weights   03/26/18 1922 03/27/18 0006  Weight: 60.8 kg (134 lb) 65.2 kg (143 lb 11.8 oz)    Examination:  General exam: Appears calm and comfortable  Respiratory system: Clear to auscultation. Respiratory effort normal. Cardiovascular system: Regular rate and rhythm, no murmurs Gastrointestinal system: Abdomen is nondistended, soft and nontender. No organomegaly or masses felt. Normal bowel sounds heard. Central nervous system: Alert and oriented.  Bilateral lower extremity weakness 4 out of 5, diminished sensation, no meningismus Extremities: No lower extremity edema Skin: No rashes on visible skin Psychiatry: Judgement and insight appear normal. Mood & affect appropriate.     Data Reviewed: I have personally reviewed following labs and imaging studies  CBC: Recent Labs  Lab 03/26/18 1926 03/27/18 0415 03/28/18  0410  WBC 19.5* 15.6* 8.3  NEUTROABS 17.2*  --   --   HGB 12.7 10.9* 10.6*  HCT 40.0 34.4* 33.5*  MCV 94.8 95.0 94.9  PLT 231 217 342   Basic Metabolic Panel: Recent Labs  Lab 03/26/18 1926 03/27/18 0415 03/28/18 0410  NA 136 140 141  K 4.1 3.8 3.7  CL 100 107 108  CO2 26  27 26   GLUCOSE 150* 103* 90  BUN 14 12 12   CREATININE 0.91 0.76 0.80  CALCIUM 9.0 8.3* 8.7*  MG  --   --  1.8   GFR: Estimated Creatinine Clearance: 61.3 mL/min (by C-G formula based on SCr of 0.8 mg/dL). Liver Function Tests: Recent Labs  Lab 03/26/18 1926  AST 31  ALT 18  ALKPHOS 122  BILITOT 0.4  PROT 7.2  ALBUMIN 3.7   No results for input(s): LIPASE, AMYLASE in the last 168 hours. No results for input(s): AMMONIA in the last 168 hours. Coagulation Profile: Recent Labs  Lab 03/26/18 1926  INR 1.06   Cardiac Enzymes: No results for input(s): CKTOTAL, CKMB, CKMBINDEX, TROPONINI in the last 168 hours. BNP (last 3 results) No results for input(s): PROBNP in the last 8760 hours. HbA1C: No results for input(s): HGBA1C in the last 72 hours. CBG: No results for input(s): GLUCAP in the last 168 hours. Lipid Profile: No results for input(s): CHOL, HDL, LDLCALC, TRIG, CHOLHDL, LDLDIRECT in the last 72 hours. Thyroid Function Tests: No results for input(s): TSH, T4TOTAL, FREET4, T3FREE, THYROIDAB in the last 72 hours. Anemia Panel: No results for input(s): VITAMINB12, FOLATE, FERRITIN, TIBC, IRON, RETICCTPCT in the last 72 hours. Sepsis Labs: Recent Labs  Lab 03/26/18 1934 03/26/18 2208  LATICACIDVEN 2.36* 1.71    Recent Results (from the past 240 hour(s))  Culture, blood (Routine x 2)     Status: None (Preliminary result)   Collection Time: 03/26/18  7:26 PM  Result Value Ref Range Status   Specimen Description   Final    BLOOD LEFT ANTECUBITAL Performed at [facility] [address], [place], [place] [address]    Special Requests   Final    BOTTLES DRAWN AEROBIC AND ANAEROBIC Blood Culture results may not be optimal due to an inadequate volume of blood received in culture bottles Performed at [facility] [address], [place], [place] [address]    Culture   Final    NO GROWTH 2 DAYS Performed at [facility] [address] [place], [place] [address]    Report Status PENDING  Incomplete  Urine culture     Status: Abnormal   Collection Time: 03/26/18  8:19 PM  Result Value Ref Range Status   Specimen Description   Final    URINE, RANDOM Performed at [facility] [address], [place], [place] [address]    Special Requests   Final    NONE Performed at [facility], [place] [address], [place], [place] [address]    Culture (A)  Final    <10,000 COLONIES/mL INSIGNIFICANT GROWTH Performed at [facility] [address] [place],  [address]    Report Status 03/28/2018 FINAL  Final  Culture, blood (Routine x 2)     Status: None (Preliminary result)   Collection Time: 03/26/18  8:25 PM  Result Value Ref Range Status   Specimen Description   Final    BLOOD RIGHT ANTECUBITAL Performed at [facility], [address] [place], [place] [address]    Special Requests   Final    BOTTLES DRAWN AEROBIC AND ANAEROBIC Blood Culture results may not be optimal due to an excessive volume of blood received in culture bottles Performed at [facility] [address], [place], [place] [address]    Culture   Final    NO GROWTH 2 DAYS Performed at [facility] [address] [place], [place] [address]    Report Status PENDING  Incomplete  CSF culture     Status: None (Preliminary result)   Collection Time: 03/27/18  2:30 PM  Result Value Ref Range Status   Specimen Description   Final    CSF Performed at [facility] [address] [place], [place] [address]    Special Requests   Final    NONE Performed at [facility], [place] [address] [place], [place] [address]    Gram Stain   Final    NO ORGANISMS SEEN NO WBC SEEN CYTOSPIN SMEAR Gram Stain Report Called to,Read Back By and Verified With: Jamesetta Geralds [id number] @ 8588 BY J SCOTTON    Culture   Final    NO  GROWTH < 24 HOURS Performed at [facility], [place] [address], [place], [place] [address]    Report Status PENDING  Incomplete         Radiology Studies: Dg Chest Port 1 View  Result Date: 03/26/2018 CLINICAL DATA:  Fever, altered mental status and generalized weakness today. EXAM: PORTABLE CHEST 1 VIEW COMPARISON:  Chest x-ray dated 05/19/2014. FINDINGS: Heart size and mediastinal contours are within normal limits. Lungs are clear. No pleural effusion or pneumothorax seen. Osseous structures about the chest are unremarkable. IMPRESSION: No active disease.  No evidence of pneumonia or pulmonary edema. Electronically Signed   By: Franki Cabot M.D.   On: 03/26/2018 20:48   Dg Fluoro Guide Lumbar Puncture  Result Date: 03/27/2018 CLINICAL DATA:  Mollaret's meningitis G03.2 (ICD-10-CM) Encephalitis due to human herpes simplex virus (HSV) B00.4 (ICD-10-CM) EXAM: DIAGNOSTIC LUMBAR PUNCTURE UNDER FLUOROSCOPIC GUIDANCE FLUOROSCOPY TIME:  Fluoroscopy Time:  0 minutes 24 seconds Radiation Exposure Index (if provided by the fluoroscopic device): Number of Acquired Spot Images: 0 PROCEDURE: Informed consent was obtained from the patient prior to the procedure, including potential complications of headache, allergy, and pain. With the patient prone, the lower back was prepped with Betadine. 1% Lidocaine was used for local anesthesia. Lumbar puncture was performed at the L3-4 level using a 20 gauge needle with return of clear CSF with an opening pressure of 20 cm water. Eighteen ml of CSF were obtained for laboratory studies. The patient tolerated the procedure well and there were no apparent complications. IMPRESSION: Successful lumbar puncture using fluoroscopic guidance. Electronically Signed   By: Franchot Gallo M.D.   On: 03/27/2018 14:42        Scheduled Meds: . diazepam  5 mg Oral BID  . diclofenac sodium  2 g Topical QID  . DULoxetine  60 mg Oral BID  . pantoprazole  40 mg Oral Daily  .  pregabalin  100 mg Oral QHS  . pregabalin  50 mg Oral Daily   Continuous Infusions: . acyclovir 600 mg (03/28/18 1331)     LOS: 2 days    Time spent: [place], MD [facility]  If 7PM-7AM, please contact night-coverage [email or web address] Password [facility] 03/28/2018, 2:29 PM

## 2018-03-28 NOTE — Progress Notes (Signed)
Spoke with pt and husband at bedside concerning [facility] needs.  Pt feel that she will not needs HH at this time, however if PT eval reveals that she will need HHPT selected [facility].

## 2018-03-28 NOTE — Progress Notes (Signed)
Pharmacy Antibiotic Note  Katelyn Lamb is a 72 y.o. female admitted on 03/26/2018 with HSV-2 meningitis vs encephalitis/ UTI.  Pharmacy has been consulted for acyclovir and levofloxacin dosing.  Plan: Day 3 Abxs  Continue Acyclovir 600 mg IV q8h   Await plan from Md for length of treatment with IV acyclovir  Height: 5\' 6"  (167.6 cm) Weight: 143 lb 11.8 oz (65.2 kg) IBW/kg (Calculated) : 59.3  Temp (24hrs), Avg:98.3 F (36.8 C), Min:98.1 F (36.7 C), Max:98.5 F (36.9 C)  Recent Labs  Lab 03/26/18 1926 03/26/18 1934 03/26/18 2208 03/27/18 0415 03/28/18 0410  WBC 19.5*  --   --  15.6* 8.3  CREATININE 0.91  --   --  0.76 0.80  LATICACIDVEN  --  2.36* 1.71  --   --     Estimated Creatinine Clearance: 61.3 mL/min (by C-G formula based on SCr of 0.8 mg/dL).    Allergies  Allergen Reactions  . Demerol [Meperidine] Other (See Comments)    Hallucinations  . Percocet [Oxycodone-Acetaminophen] Itching  . Amoxicillin-Pot Clavulanate Other (See Comments)    Severe pain, headache, intestinal infection  . Penicillins Itching and Rash    Has patient had a PCN reaction causing immediate rash, facial/tongue/throat swelling, SOB or lightheadedness with hypotension: Yes Has patient had a PCN reaction causing severe rash involving mucus membranes or skin necrosis: No Has patient had a PCN reaction that required hospitalization: No Has patient had a PCN reaction occurring within the last 10 years: Yes If all of the above answers are "NO", then may proceed with Cephalosporin use.    Antimicrobials this admission: 7/24 aztreonam x 1  7/24 vancomycin x1   7/24 levofloxacin >> 7/25 7/24 IV acyclovir >>  Dose adjustments this admission: ---  Microbiology results: 7/24 BCx:  ngtd 7/24 UCx:  <10k insignificant growth 7/25 CSF: ngtd   Thank you for allowing pharmacy to be a part of this patient's care.  Adrian Saran, PharmD, BCPS Pager [phone] 03/28/2018 8:41 AM

## 2018-03-28 NOTE — Evaluation (Signed)
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  Clinical Impression  Pt admitted with above diagnosis. Pt currently with functional limitations due to the deficits listed below (see PT Problem List).  Pt will benefit from skilled PT to increase their independence and safety with mobility to allow discharge to the venue listed below.  Pt assisted with ambulating in hallway and initially required min assist however progressed to min/guard for safety with mobility.  Pt reports having long history with PT and does not feel she will need this upon d/c, also able to f/u with neurologist if PT is indicated.  Pt states good family support and spouse can assist if needed upon d/c.     Follow Up Recommendations No PT follow up(pt reports she has received a lot of PT in the past, will f/u with neurologist if PT is needed upon d/c)    Equipment Recommendations  None recommended by PT    Recommendations for Other Services       Precautions / Restrictions Precautions Precautions: Fall Precaution Comments: hx of decreased sensation throughout body      Mobility  Bed Mobility Overal bed mobility: Needs Assistance Bed Mobility: Supine to Sit     Supine to sit: HOB elevated;Supervision        Transfers Overall transfer level: Needs assistance Equipment used: Rolling walker (2 wheeled) Transfers: Sit to/from Stand Sit to Stand: Min assist         General transfer comment: verbal cues for safe technique, assist to rise and steady initially from bed however improved to min/guard from  toilet  Ambulation/Gait Ambulation/Gait assistance: Min assist Gait Distance (Feet): 160 Feet Assistive device: Rolling walker (2 wheeled) Gait Pattern/deviations: Step-through pattern;Decreased stride length     General Gait Details: assist for steadying however this improved with distance, min/guard last 100 feet  Stairs            Wheelchair Mobility    Modified Rankin (Stroke Patients Only)       Balance                                             Pertinent Vitals/Pain Pain Assessment: No/denies pain    Home Living Family/patient expects to be discharged to:: Private residence Living Arrangements: Spouse/significant other Available Help at Discharge: Family;Available 24 hours/day Type of Home: House Home Access: Stairs to enter   [employer] of Steps: 2 Home Layout: Two level;Able to live on main level with bedroom/bathroom Home Equipment: Bedside commode;Walker - 2 wheels;Adaptive equipment      Prior Function Level of Independence: Independent               Hand Dominance        Extremity/Trunk Assessment   Upper Extremity Assessment Upper Extremity Assessment: (sensation deficits from hx of infection spread through nervous system per pt)    Lower Extremity Assessment Lower Extremity Assessment: Overall WFL for tasks assessed       Communication   Communication: No difficulties  Cognition Arousal/Alertness: Awake/alert Behavior During  Therapy: WFL for tasks assessed/performed Overall Cognitive Status: Within Functional Limits for tasks assessed                                        General Comments      Exercises General Exercises - Lower Extremity Ankle Circles/Pumps: AROM;10 reps;Both Long Arc Quad: AROM;10 reps;Both;Seated Hip ABduction/ADduction: Both;Supine;AROM;10 reps Straight Leg Raises: AROM;10 reps;Both;Supine Hip Flexion/Marching: AROM;10 reps;Both;Seated    Assessment/Plan    PT Assessment Patient needs continued PT services  PT Problem List Decreased strength;Decreased mobility;Decreased activity tolerance;Decreased balance;Decreased knowledge of use of DME       PT Treatment Interventions DME instruction;Therapeutic activities;Gait training;Therapeutic exercise;Functional mobility training;Patient/family education;Balance training    PT Goals (Current goals can be found in the Care Plan section)  Acute Rehab PT Goals PT Goal Formulation: With patient Time For Goal Achievement: 04/11/18 Potential to Achieve Goals: Good    Frequency Min 3X/week   Barriers to discharge        Co-evaluation               AM-PAC PT "6 Clicks" Daily Activity  Outcome Measure Difficulty turning over in bed (including adjusting bedclothes, sheets and blankets)?: None Difficulty moving from lying on back to sitting on the side of the bed? : A Little Difficulty sitting down on and standing up from a chair with arms (e.g., wheelchair, bedside commode, etc,.)?: A Little Help needed moving to and from a bed to chair (including a wheelchair)?: A Little Help needed walking in hospital room?: A Little Help needed climbing 3-5 steps with a railing? : A Little 6 Click Score: 19    End of Session Equipment Utilized During Treatment: Gait belt Activity Tolerance: Patient tolerated treatment well Patient left: in chair;with call bell/phone within reach(pt aware to call for assist) Nurse Communication: Mobility status PT Visit Diagnosis: Other abnormalities of gait and mobility (R26.89);Muscle weakness (generalized) (M62.81)    Time: 9702-6378 PT Time Calculation (min) (ACUTE ONLY): 27 min   Charges:   PT Evaluation $PT Eval Moderate Complexity: 1 Mod         Carmelia Bake, PT, DPT 03/28/2018 Pager: [phone]  York Ram E 03/28/2018, 2:52 PM

## 2018-03-29 MED ORDER — CIPROFLOXACIN HCL 500 MG PO TABS: 500.0000 mg | ORAL_TABLET | Freq: Two times a day (BID) | ORAL | 0 refills | Status: AC

## 2018-03-29 NOTE — Discharge Summary (Signed)
Physician Discharge Summary  Katelyn Lamb:[id number] DOB: 1945-10-19 DOA: 03/26/2018  PCP: [name], MD  Admit date: 03/26/2018 Discharge date: 03/29/2018  Admitted From: Home Disposition:  Home  Recommendations for Outpatient Follow-up:  1. Follow up with PCP in 1-2 weeks 2. Please obtain BMP/CBC in one week   Home Health: No Equipment/Devices: None  Discharge Condition: stable CODE STATUS: full Diet recommendation:  Regular   Brief/Interim Summary:  #) Altered mental status secondary to sepsis secondary to UTI: Patient was admitted with fever.  Initially was concerning for the return of her Mollaret syndrome (see below) however as an outpatient at [facility] patient did have urine culture growing a gram-negative rod that was susceptible to ciprofloxacin as well as grade 100,000 viridans group strep.  She was started on treatment for this and her symptoms improved dramatically.  She was discharged home on ciprofloxacin.  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  Patient was started on IV acyclovir.  She did have an MRI that was relatively unchanged but did show possible new lesions approximately 2 weeks prior as an outpatient.  Patient had a lumbar puncture performed on 03/27/2018.  From this the cell count was reassuring.  The protein and glucose were normal.  The culture was negative.  Herpes simplex PCR was negative.  Patient's home oral acyclovir was resumed on discharge.  #) Pain/psych: Patient was continued on home alprazolam, diazepam, duloxetine, pregabalin.    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)    Discharge Instructions  Discharge Instructions    Call MD for:  difficulty breathing, headache or visual disturbances    Complete by:  As directed    Call MD for:  hives   Complete by:  As directed    Call MD for:  persistant dizziness or light-headedness   Complete by:  As directed    Call MD for:  persistant nausea and vomiting   Complete by:  As directed    Call MD for:  redness, tenderness, or signs of infection (pain, swelling, redness, odor or green/yellow discharge around incision site)   Complete by:  As directed    Call MD for:  severe uncontrolled pain   Complete by:  As directed    Call MD for:  temperature >100.4   Complete by:  As directed    Diet - low sodium heart healthy   Complete by:  As directed    Discharge instructions   Complete by:  As directed    Please follow-up with your primary doctor in 1 to 2 weeks.  Please take your anti-medics as prescribed.   Increase activity slowly   Complete by:  As directed      Allergies as of 03/29/2018      Reactions   Demerol [meperidine] Other (See Comments)   Hallucinations   Percocet [oxycodone-acetaminophen] Itching   Amoxicillin-pot Clavulanate Diarrhea   Severe pain, headache, intestinal infection   Penicillins Itching, Rash   Has patient had a PCN reaction causing immediate rash, facial/tongue/throat swelling, SOB or lightheadedness with hypotension:  NO Has patient had a PCN reaction causing severe rash involving mucus membranes or skin necrosis: No Has patient had a PCN reaction that required hospitalization: No Has patient had a PCN reaction occurring within the  last 10 years: Yes If all of the above answers are "NO", then may proceed with Cephalosporin use.      Medication List    TAKE these medications   acyclovir 400 MG tablet Commonly known as:  ZOVIRAX TAKE 1 TABLET BY MOUTH TWICE A DAY   ALPRAZolam 1 MG tablet Commonly known as:  XANAX Take 1 mg by mouth at bedtime as needed for anxiety or sleep.   Biotin 10000 MCG Tabs Take 1 tablet by mouth daily.   CALCIUM+D3 PO Take 1 tablet by mouth 2 (two) times daily.  1200 mg calcium +D3   ciprofloxacin 500 MG tablet Commonly known as:  CIPRO Take 1 tablet (500 mg total) by mouth 2 (two) times daily for 8 days.   conjugated estrogens vaginal cream Commonly known as:  PREMARIN Place 1 Applicatorful vaginally daily as needed (itching).   diazepam 5 MG tablet Commonly known as:  VALIUM Take 5 mg by mouth 2 (two) times daily.   diclofenac sodium 1 % Gel Commonly known as:  VOLTAREN APPLY 2 GRAM TO THE AFFECTED AREA(S) 4 TIMES PER DAY   DULoxetine 60 MG capsule Commonly known as:  CYMBALTA TAKE 1 CAPSULE (60 MG TOTAL) BY MOUTH 2 (TWO) TIMES DAILY.   HYDROcodone-acetaminophen 10-325 MG tablet Commonly known as:  NORCO Take 1 tablet by mouth 3 (three) times daily as needed for severe pain.   nitrofurantoin (macrocrystal-monohydrate) 100 MG capsule Commonly known as:  MACROBID Take 100 mg by mouth 2 (two) times daily.   omega-3 acid ethyl esters 1 g capsule Commonly known as:  LOVAZA Take 1 capsule (1 g total) by mouth 2 (two) times daily.   pantoprazole 40 MG tablet Commonly known as:  PROTONIX Take 1 tablet (40 mg total) by mouth daily.   polyethylene glycol packet Commonly known as:  MIRALAX / GLYCOLAX Take 17 g by mouth daily. What changed:    when to take this  reasons to take this   pregabalin 50 MG capsule Commonly known as:  LYRICA TAKE 1 CAPSULE IN THE MORNING AND 2 CAPSULES IN THE EVENING   promethazine 25 MG suppository Commonly known as:  PHENERGAN Place 25 mg rectally every 12 (twelve) hours as needed for nausea or vomiting (diarrha).   STOPAIN EX Apply 1 application topically See admin instructions. Spray topically at bedtime as needed for pain   VITAMIN B12 PO Take 1 tablet by mouth daily.       Allergies  Allergen Reactions  . Demerol [Meperidine] Other (See Comments)    Hallucinations  . Percocet [Oxycodone-Acetaminophen] Itching  . Amoxicillin-Pot Clavulanate Diarrhea    Severe pain, headache,  intestinal infection  . Penicillins Itching and Rash    Has patient had a PCN reaction causing immediate rash, facial/tongue/throat swelling, SOB or lightheadedness with hypotension:  NO Has patient had a PCN reaction causing severe rash involving mucus membranes or skin necrosis: No Has patient had a PCN reaction that required hospitalization: No Has patient had a PCN reaction occurring within the last 10 years: Yes If all of the above answers are "NO", then may proceed with Cephalosporin use.    Consultations:  Interventional radiology   Procedures/Studies: Mr Kizzie Fantasia Contrast  Result Date: 03/15/2018  [facility] [address] [place], [place] [address] [phone] NEUROIMAGING REPORT STUDY DATE: 03/14/2018 PATIENT NAME: MERIDETH BOSQUE DOB: 10-07-1945 MRN: [id number] EXAM: MRI Brain with and without contrast ORDERING CLINICIAN: [name], MD CLINICAL HISTORY:  72 year old woman with history of encephalomyelitis with neurologic symptoms COMPARISON FILMS: MRI 12/25/2016 TECHNIQUE:MRI of the brain with and without contrast was obtained utilizing 5 mm axial slices with T1, T2, T2 flair, SWI and diffusion weighted views.  T1 sagittal, T2 coronal and postcontrast views in the axial and coronal plane were obtained. CONTRAST: 13 ml Multihance IMAGING SITE: [facility], [place]. FINDINGS: On sagittal images, the spinal cord is imaged caudally to C2-C3 and is normal in caliber.   The contents of the posterior fossa are of normal size and position.   The pituitary gland and optic chiasm appear normal.    There is mild to moderate generalized cortical atrophy, increased in the frontal and temporal lobes when compared to the 12/25/2016 MRI.  There are no abnormal extra-axial collections of fluid.  The cerebellum appears normal.  There are T2/FLAIR hyperintense foci in the pons, unchanged compared to the 12/25/2016 MRI.  The deep gray matter appears normal.   There are extensive T2/FLAIR hyperintense foci in the hemispheres with confluent areas in the deep and periventricular white matter of the frontal lobes.  There has been significant progression of these changes in the right frontal lobe when compared to the previous MRI.  Diffusion weighted images are normal.  Susceptibility weighted images are normal.   The VIIth/VIIIth nerve complex appears normal.  There is a mild right mastoid effusion..  The paranasal sinuses appear normal.  There are bilateral lens replacements.  Otherwise the orbits appear normal.  Flow voids are identified within the major intracerebral arteries.  After the infusion of contrast material, a normal enhancement pattern is noted.   This MRI of the brain with and without contrast shows the following: 1.  Extensive T2/FLAIR hyperintense foci in the hemispheres and in the pons with large confluencies in the frontal lobes.  The right frontal lobe changes have increased when compared to the 12/25/2016 MRI while the changes elsewhere are more stable.  These findings are consistent with chronic microvascular ischemic changes with superimposed more recent right frontal findings that could be due to the history of encephalomyelitis or chronic stroke.   2.  The extent of atrophy, especially in the frontal and temporal lobes, has increased when compared to the previous MRI from 12/25/2016. 3.  There are no acute findings.   INTERPRETING PHYSICIAN: Richard A. Felecia Shelling, MD, PhD, FAAN Certified in  Neuroimaging by [employer]   Mr Cervical Spine W Wo Contrast  Result Date: 03/15/2018  [facility] [address] [place], [place] [address] [phone] NEUROIMAGING REPORT STUDY DATE: 03/14/2018 PATIENT NAME: BEAUX VERNE DOB: [birth year] MRN: [id number] EXAM: MRI of the cervical spine ORDERING CLINICIAN: [name], MD CLINICAL HISTORY: 72 year old woman with history of encephalomyelitis with neurologic symptoms  COMPARISON FILMS: MRI of the cervical spine 12/25/2016 TECHNIQUE: MRI of the cervical spine was obtained utilizing 3 mm sagittal slices from the posterior fossa down to the T3-4 level with T1, T2 and inversion recovery views. In addition 4 mm axial slices from M5-7 down to T1-2 level were included with T2 and gradient echo views. CONTRAST: 13 mL MultiHance IMAGING SITE: [facility], [place], [place], [name] FINDINGS: :  On sagittal images, the spine is imaged from above the cervicomedullary junction to T2.  There are T2 hyperintense foci within the the spinal cord anteriorly to the right at the cervical medullary junction, posteriorly adjacent to C3-C4 and posteriorly adjacent to C6.  The 3 foci  were present on the 2018 MRI though the focus adjacent to C3-C4 is mildly reduced in size.  There is very minimal retrolisthesis of C6 upon C7.   There is a hemangioma within the T2 vertebral body.  There is loss of disc height at C3-C4, C4-C5, C5-C6 and C6-C7.  The discs and interspaces were further evaluated on axial views from C2 to T1 as follows: C2 - C3:  The disc and interspace appear normal. C3 - C4: There is disc protrusion and uncovertebral spurring causing mild bilateral foraminal narrowing but no nerve root compression or spinal stenosis. C4 - C5: There is disc bulging and uncovertebral spurring leading to mild left foraminal narrowing but no nerve root compression.. C5 - C6: There is moderate spinal stenosis due to a left paramedian disc protrusion and uncovertebral spurring.  There is mild to moderate left and mild right foraminal narrowing but no nerve root compression. C6 - C7: There is 1 to 2 mm of retrolisthesis associated with disc bulging, uncovertebral spurring and ligamentum flavum hypertrophy causing mild spinal stenosis and moderate bilateral foraminal narrowing but no definite nerve root compression.. C7 - T1: There is mild left disc bulging but no spinal stenosis or nerve root  compression.. After the infusion of contrast, there is some enhancement of the focus at C3, improved when compared to the 12/25/2016 MRI. When compared to the MRI dated 12/25/2016, the degenerative changes are essentially unchanged.    This MRI of the cervical spine with and without contrast shows the following: 1.    There are 3 foci within the spinal cord, anteriorly at the cervicomedullary junction, posteriorly at C3-C4 and centrally to posteriorly at C6.  All these foci were present on the 12/25/2016 MRI.  There is some enhancement of the focus adjacent to C3 though this has improved when compared to the 2018 MRI.  This focus is also mildly reduced in size compared to the 2018 MRI.  The MRI foci are likely due to sequela of the previous encephalomyelitis. 2.    There is moderate spinal stenosis at C5-C6 causing mild to moderate left foraminal narrowing but no definite nerve root compression. 3.    There is minimal retrolisthesis and mild spinal stenosis with moderate bilateral foraminal narrowing at C6-C7 but no definite nerve root compression. 4.    There are milder degenerative changes as detailed above at the other cervical levels as detailed above. INTERPRETING PHYSICIAN: Richard A. Felecia Shelling, MD, PhD, FAAN Certified in  Neuroimaging by [employer]   Dg Chest [place] 1 View  Result Date: 03/26/2018 CLINICAL DATA:  Fever, altered mental status and generalized weakness today. EXAM: PORTABLE CHEST 1 VIEW COMPARISON:  Chest x-ray dated 05/19/2014. FINDINGS: Heart size and mediastinal contours are within normal limits. Lungs are clear. No pleural effusion or pneumothorax seen. Osseous structures about the chest are unremarkable. IMPRESSION: No active disease.  No evidence of pneumonia or pulmonary edema. Electronically Signed   By: Franki Cabot M.D.   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 With the patient  prone, the lower back was prepped with Betadine. 1% Lidocaine was used for local anesthesia. Lumbar puncture was performed at the L3-4 level using a 20 gauge needle with return of clear CSF with an opening pressure of 20 cm water. Eighteen ml of CSF were obtained for laboratory studies. The patient tolerated the procedure well and there were no apparent complications. IMPRESSION: Successful lumbar puncture using fluoroscopic guidance. Electronically Signed   By: Franchot Gallo M.D.   On: 03/27/2018 14:42       03/27/2018 fluoroscopic lumbar puncture  Subjective:   Discharge Exam: Vitals:   03/29/18 0149 03/29/18 0625  BP: 117/70 98/65  Pulse: (!) 59 68  Resp: 16 16  Temp: 97.8 F (36.6 C) 97.9 F (36.6 C)  SpO2: 95% 97%   Vitals:   03/28/18 1101 03/28/18 1527 03/29/18 0149 03/29/18 0625  BP: 95/72 92/62 117/70 98/65  Pulse: 86 80 (!) 59 68  Resp: 18 18 16 16   Temp: 98.1 F (36.7 C) 98.1 F (36.7 C) 97.8 F (36.6 C) 97.9 F (36.6 C)  TempSrc: Oral Oral Oral Oral  SpO2: 94% 94% 95% 97%  Weight:      Height:        General exam: Appears calm and comfortable  Respiratory system: Clear to auscultation. Respiratory effort normal. Cardiovascular system: Regular rate and rhythm, no murmurs Gastrointestinal system: Abdomen is nondistended, soft and nontender. No organomegaly or masses felt. Normal bowel sounds heard. Central nervous system: Alert and oriented.  Bilateral lower extremity weakness 4 out of 5, diminished sensation, no  meningismus Extremities: No lower extremity edema Skin: No rashes on visible skin Psychiatry: Judgement and insight appear normal. Mood & affect appropriate.      The results of significant diagnostics from this hospitalization (including imaging, microbiology, ancillary and laboratory) are listed below for reference.     Microbiology: Recent Results (from the past 240 hour(s))  Culture, blood (Routine x 2)     Status: None (Preliminary result)   Collection Time: 03/26/18  7:26 PM  Result Value Ref Range Status   Specimen Description   Final    BLOOD LEFT ANTECUBITAL Performed at [facility] [address] [place] [address], [place], [place] [address]    Special Requests   Final    BOTTLES DRAWN AEROBIC AND ANAEROBIC Blood Culture results may not be optimal due to an inadequate volume of blood received in culture bottles Performed at [facility] [address] [place], [place] [address]    Culture   Final    NO GROWTH 3 DAYS Performed at [facility], [place] [address], [place], [place] [address]    Report Status PENDING  Incomplete  Urine culture     Status: Abnormal   Collection Time: 03/26/18  8:19 PM  Result Value Ref Range Status   Specimen Description   Final    URINE, RANDOM Performed at [facility] [address], [place], [place] [address]    Special Requests   Final    NONE Performed at [facility], [place] [address], [place], [place] [address]    Culture (A)  Final    <10,000 COLONIES/mL INSIGNIFICANT GROWTH Performed at [facility] [address] [place], [place] [address]    Report Status 03/28/2018 FINAL  Final  Culture, blood (Routine x 2)     Status: None (Preliminary result)   Collection Time: 03/26/18  8:25 PM  Result Value Ref Range Status  Specimen Description   Final    BLOOD RIGHT ANTECUBITAL Performed at [facility] [address],  [place], [place] [address]    Special Requests   Final    BOTTLES DRAWN AEROBIC AND ANAEROBIC Blood Culture results may not be optimal due to an excessive volume of blood received in culture bottles Performed at [facility] [address], [place], [place] [address]    Culture   Final    NO GROWTH 3 DAYS Performed at [facility], [place] [address] [place], [place] [address]    Report Status PENDING  Incomplete  CSF culture     Status: None (Preliminary result)   Collection Time: 03/27/18  2:30 PM  Result Value Ref Range Status   Specimen Description   Final    CSF Performed at [facility] [address], [place], [place] [address]    Special Requests   Final    NONE Performed at [facility], [place] [address], [place], [place] [address]    Gram Stain   Final    NO ORGANISMS SEEN NO WBC SEEN CYTOSPIN SMEAR Gram Stain Report Called to,Read Back By and Verified With: Jamesetta Geralds [id number] @ 1660 BY J SCOTTON    Culture   Final    NO GROWTH 2 DAYS Performed at [facility], [place] [address] [place],  [address]    Report Status PENDING  Incomplete     Labs: BNP (last 3 results) No results for input(s): BNP in the last 8760 hours. Basic Metabolic Panel: Recent Labs  Lab 03/26/18 1926 03/27/18 0415 03/28/18 0410  NA 136 140 141  K 4.1 3.8 3.7  CL 100 107 108  CO2 26 27 26   GLUCOSE 150* 103* 90  BUN 14 12 12   CREATININE 0.91 0.76 0.80  CALCIUM 9.0 8.3* 8.7*  MG  --   --  1.8   Liver Function Tests: Recent Labs  Lab 03/26/18 1926  AST 31  ALT 18  ALKPHOS 122  BILITOT 0.4  PROT 7.2  ALBUMIN 3.7   No results for input(s): LIPASE, AMYLASE in the last 168 hours. No results for input(s): AMMONIA in the last 168 hours. CBC: Recent Labs  Lab 03/26/18 1926 03/27/18 0415 03/28/18 0410  WBC 19.5* 15.6* 8.3  NEUTROABS 17.2*  --   --   HGB 12.7 10.9* 10.6*  HCT 40.0 34.4* 33.5*  MCV 94.8  95.0 94.9  PLT 231 217 213   Cardiac Enzymes: No results for input(s): CKTOTAL, CKMB, CKMBINDEX, TROPONINI in the last 168 hours. BNP: Invalid input(s): POCBNP CBG: No results for input(s): GLUCAP in the last 168 hours. D-Dimer No results for input(s): DDIMER in the last 72 hours. Hgb A1c No results for input(s): HGBA1C in the last 72 hours. Lipid Profile No results for input(s): CHOL, HDL, LDLCALC, TRIG, CHOLHDL, LDLDIRECT in the last 72 hours. Thyroid function studies No results for input(s): TSH, T4TOTAL, T3FREE, THYROIDAB in the last 72 hours.  Invalid input(s): FREET3 Anemia work up No results for input(s): VITAMINB12, FOLATE, FERRITIN, TIBC, IRON, RETICCTPCT in the last 72 hours. Urinalysis    Component Value Date/Time   COLORURINE YELLOW 03/26/2018 2019   APPEARANCEUR CLEAR 03/26/2018 2019   LABSPEC 1.005 03/26/2018 2019   PHURINE 8.0 03/26/2018 2019   GLUCOSEU NEGATIVE 03/26/2018 2019   HGBUR NEGATIVE 03/26/2018 2019   BILIRUBINUR NEGATIVE 03/26/2018 2019   [place] NEGATIVE 03/26/2018 2019   PROTEINUR  NEGATIVE 03/26/2018 2019   NITRITE NEGATIVE 03/26/2018 2019   LEUKOCYTESUR NEGATIVE 03/26/2018 2019   Sepsis Labs Invalid input(s): PROCALCITONIN,  WBC,  LACTICIDVEN Microbiology Recent Results (from the past 240 hour(s))  Culture, blood (Routine x 2)     Status: None (Preliminary result)   Collection Time: 03/26/18  7:26 PM  Result Value Ref Range Status   Specimen Description   Final    BLOOD LEFT ANTECUBITAL Performed at [facility] [address], [place], [place] [address]    Special Requests   Final    BOTTLES DRAWN AEROBIC AND ANAEROBIC Blood Culture results may not be optimal due to an inadequate volume of blood received in culture bottles Performed at [facility] [address], [place], [place] [address]    Culture   Final    NO GROWTH 3 DAYS Performed at [facility], [place] [address]  [place], [place] [address]    Report Status PENDING  Incomplete  Urine culture     Status: Abnormal   Collection Time: 03/26/18  8:19 PM  Result Value Ref Range Status   Specimen Description   Final    URINE, RANDOM Performed at [facility] [address] [place], [place] [address]    Special Requests   Final    NONE Performed at [facility], [place] [address], [place], [place] [address]    Culture (A)  Final    <10,000 COLONIES/mL INSIGNIFICANT GROWTH Performed at [facility] [address] [place], [place] [address]    Report Status 03/28/2018 FINAL  Final  Culture, blood (Routine x 2)     Status: None (Preliminary result)   Collection Time: 03/26/18  8:25 PM  Result Value Ref Range Status   Specimen Description   Final    BLOOD RIGHT ANTECUBITAL Performed at [facility] [address] [place], [place] [address]    Special Requests   Final    BOTTLES DRAWN AEROBIC AND ANAEROBIC Blood Culture results may not be optimal due to an excessive volume of blood received in culture bottles Performed at [facility] [address], [place], [place] [address]    Culture   Final    NO GROWTH 3 DAYS Performed at [facility], [place] [address] [place], [place] [address]    Report Status PENDING  Incomplete  CSF culture     Status: None (Preliminary result)   Collection Time: 03/27/18  2:30 PM  Result Value Ref Range Status   Specimen Description   Final    CSF Performed at [facility] [address], [place], [place] [address]    Special Requests   Final    NONE Performed at [facility], [place] [address] [place], [place] [address]    Gram Stain   Final    NO ORGANISMS SEEN NO WBC SEEN CYTOSPIN SMEAR Gram Stain Report Called to,Read Back By and Verified With: Jamesetta Geralds [id number] @ 6789 BY J SCOTTON    Culture   Final    NO GROWTH 2 DAYS Performed  at [facility], [place] [address], [place], [place] [address]    Report Status PENDING  Incomplete     Time coordinating discharge: Over 30 minutes  SIGNED:   Cristy Folks, MD  [facility] 03/29/2018, 1:32 PM  If 7PM-7AM, please contact night-coverage [email or web address] Password [email or web address]

## 2018-03-29 NOTE — Discharge Instructions (Signed)

## 2018-03-31 LAB — CULTURE, BLOOD (ROUTINE X 2)
CULTURE: NO GROWTH
CULTURE: NO GROWTH

## 2018-03-31 LAB — CSF CULTURE: CULTURE: NO GROWTH

## 2018-03-31 LAB — CSF CULTURE W GRAM STAIN: Gram Stain: NONE SEEN

## 2018-04-05 IMAGING — CR DG LUMBAR SPINE COMPLETE 4+V
5 series · 5 of 5 positions shown · non-contrast
Comparison: Lumbar MRI 02/26/2008

CLINICAL DATA: Low back pain with left leg pain

EXAM:
LUMBAR SPINE - COMPLETE 4+ VIEW

[l-spine obl (1 of 2)]
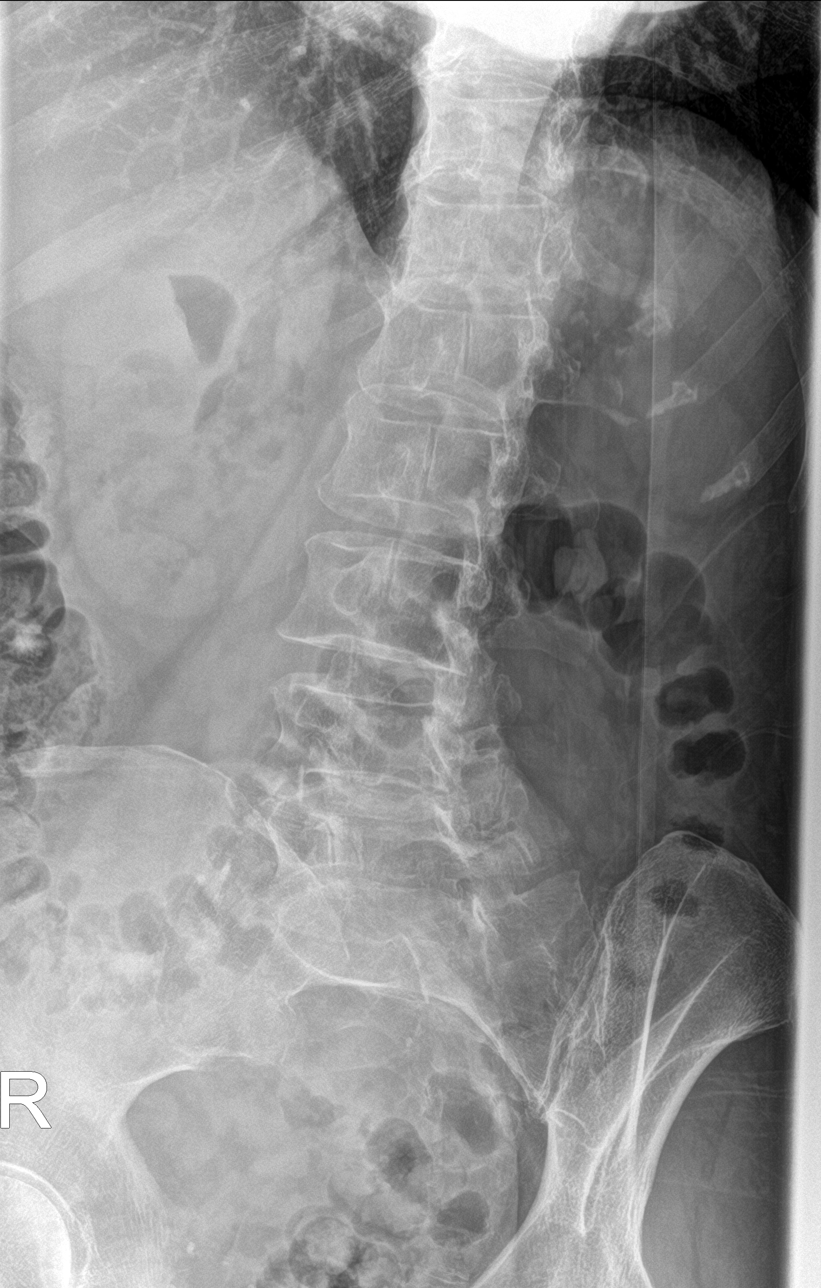

[l-spine obl (2 of 2)]
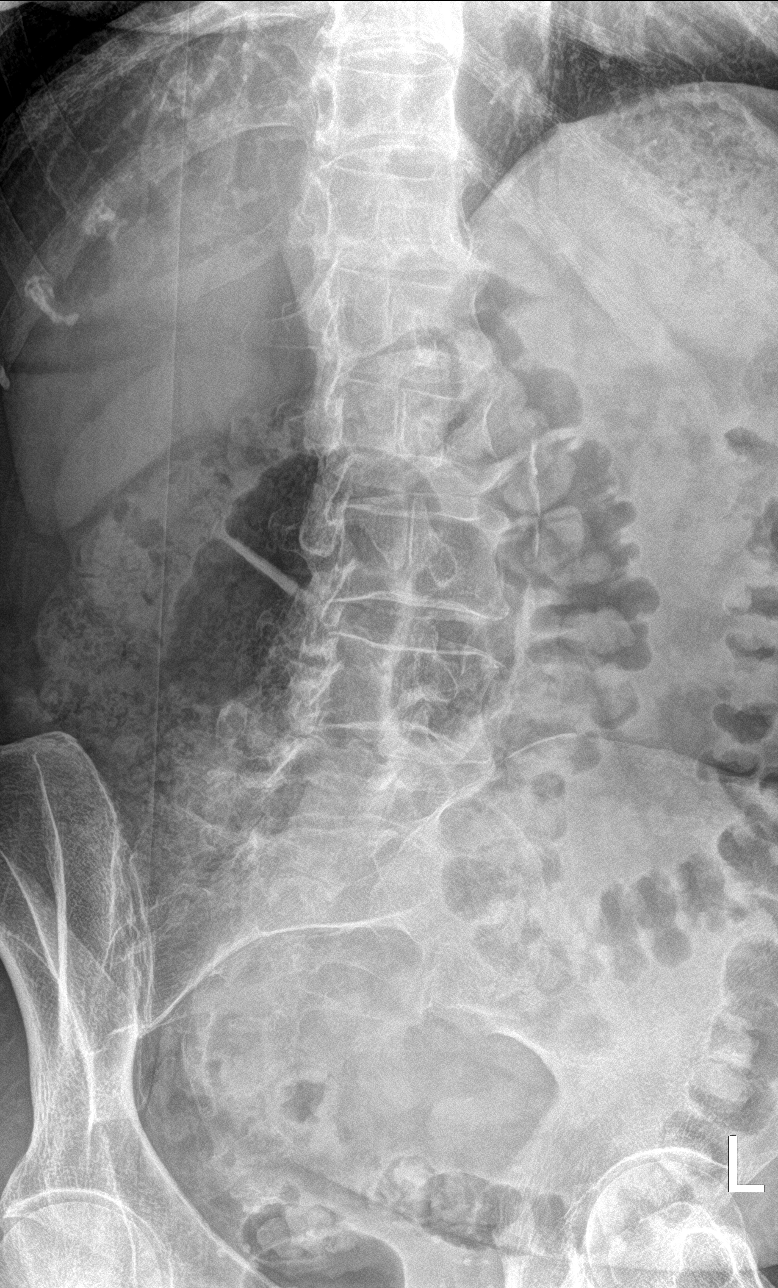

[l-spine lat]
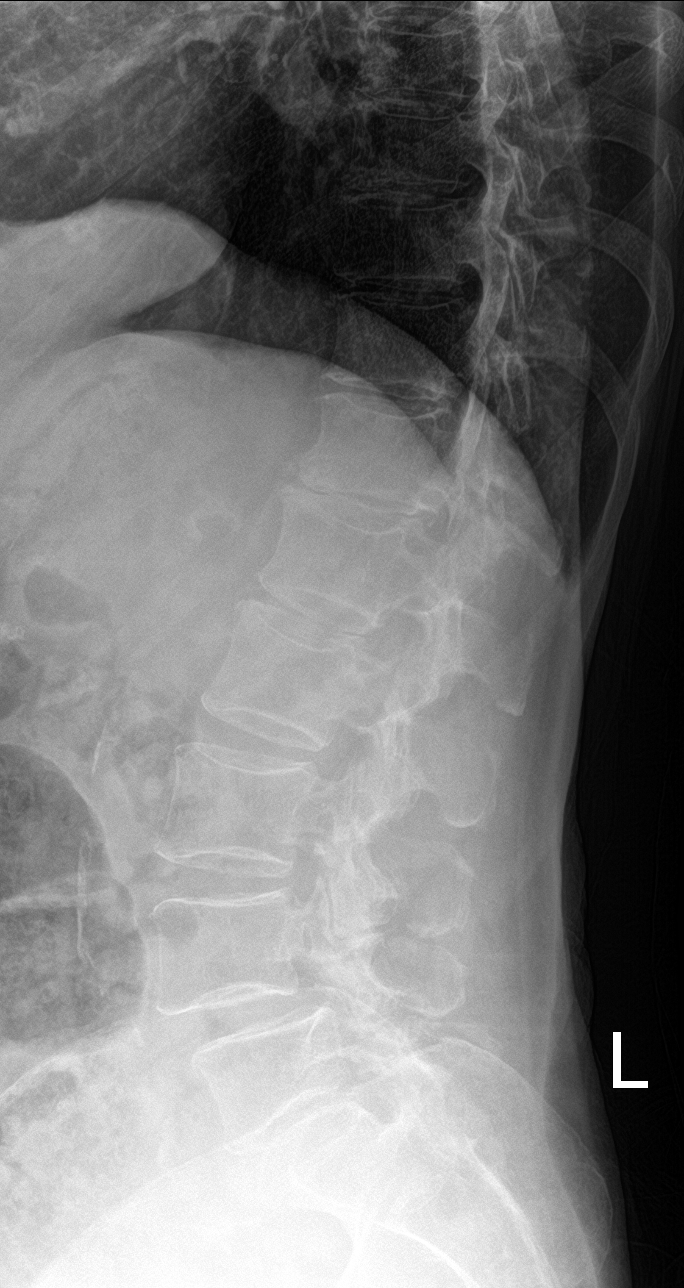

[l-spine spot]
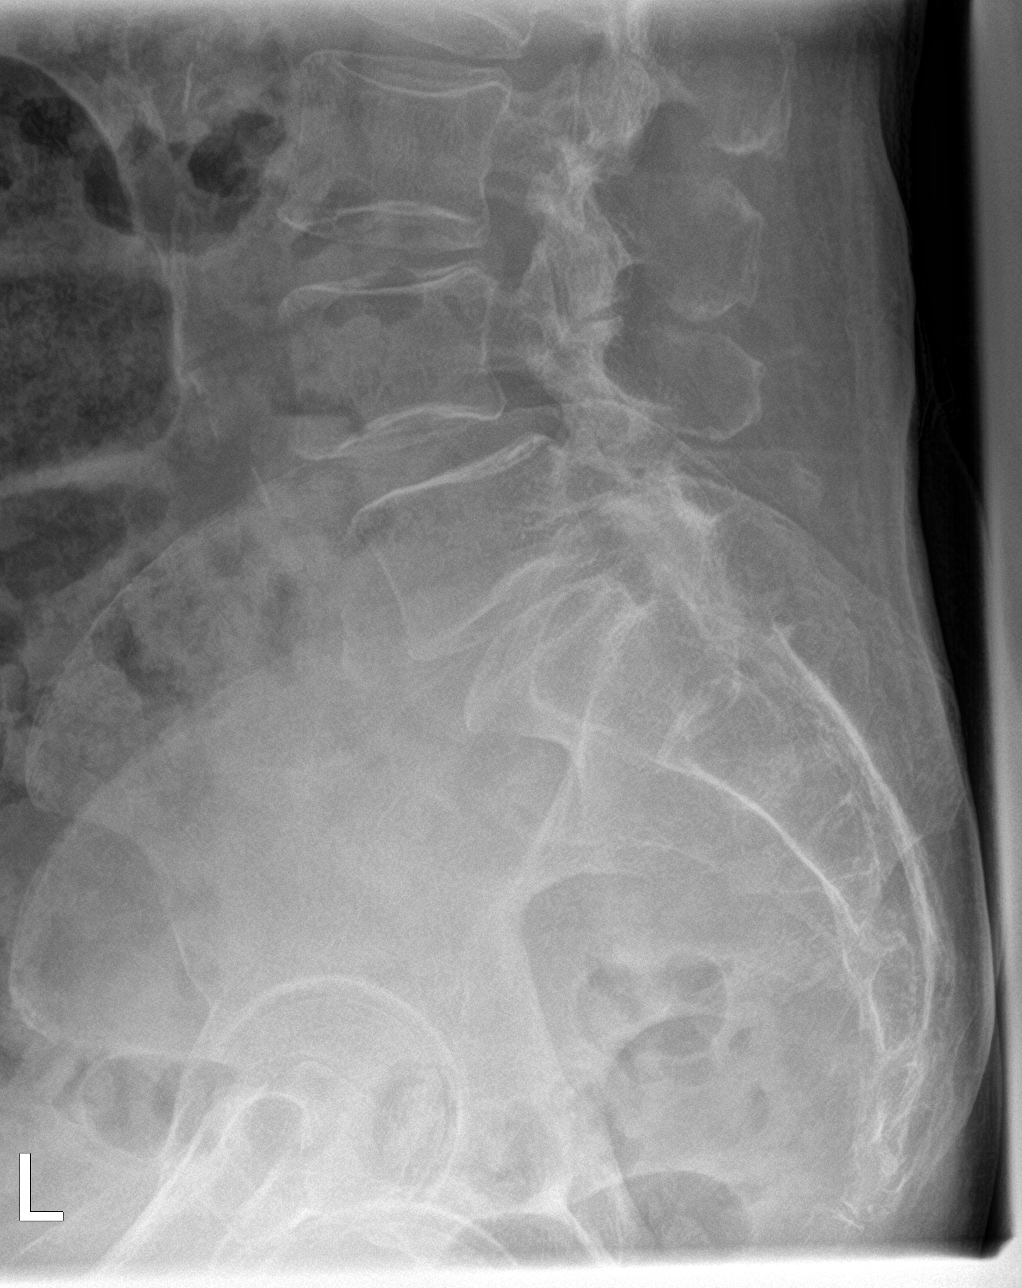

[l-spine ap]
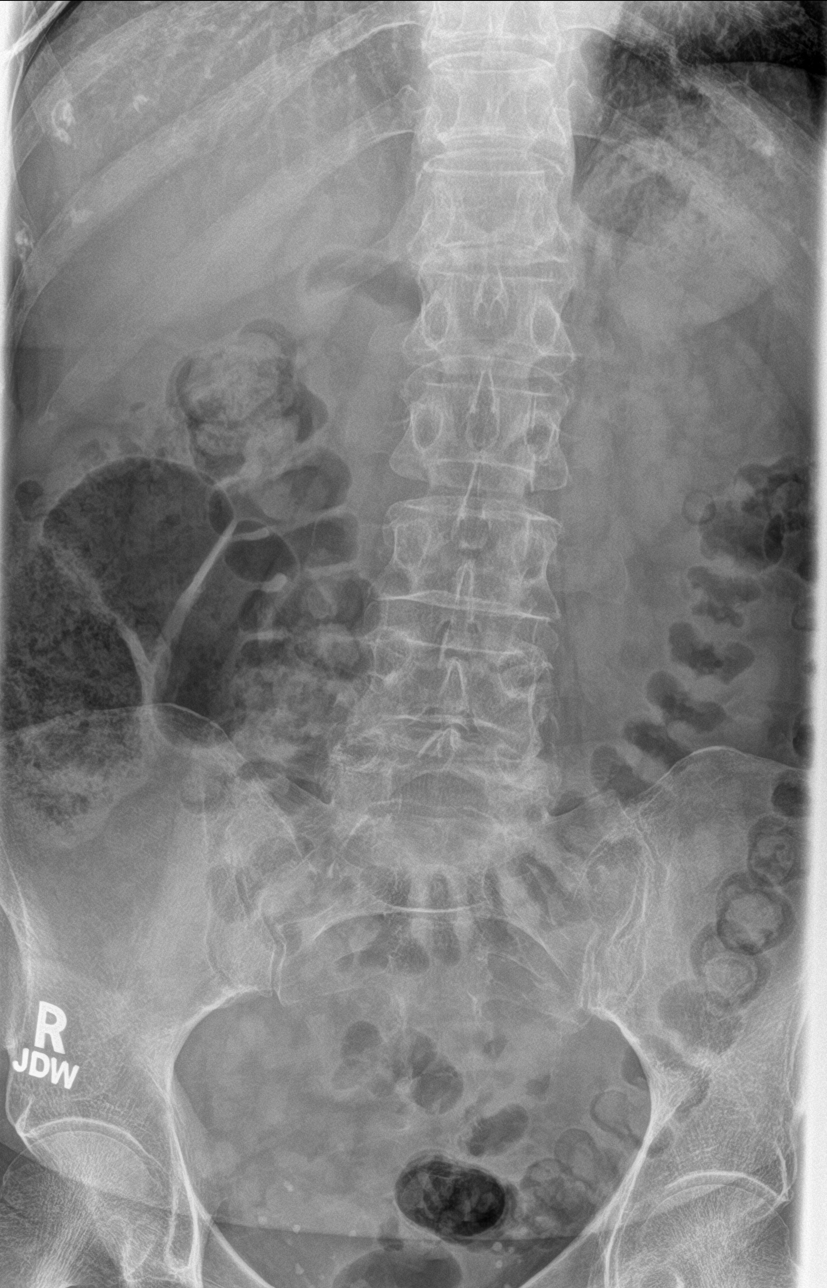

[5 of 5 positions shown; findings below may reference images not displayed]

FINDINGS: 5 mm anterior slip L4-L5 has progressed in the interval. Remaining
alignment normal. Negative for fracture or pars defect. Facet
degeneration at L4-5. Disc spaces intact.
IMPRESSION: Grade 1 anterolisthesis L4-5 has progressed since 7229. No acute
abnormality

## 2018-04-08 DIAGNOSIS — F411 Generalized anxiety disorder: Secondary | ICD-10-CM | POA: Diagnosis not present

## 2018-04-08 DIAGNOSIS — R2689 Other abnormalities of gait and mobility: Secondary | ICD-10-CM | POA: Diagnosis not present

## 2018-04-08 DIAGNOSIS — N390 Urinary tract infection, site not specified: Secondary | ICD-10-CM | POA: Diagnosis not present

## 2018-04-09 ENCOUNTER — Ambulatory Visit: Payer: PPO | Admitting: Neurology

## 2018-04-09 ENCOUNTER — Other Ambulatory Visit: Payer: Self-pay | Admitting: Radiology

## 2018-04-09 ENCOUNTER — Encounter: Payer: Self-pay | Admitting: Neurology

## 2018-04-09 VITALS — BP 117/60 | HR 84 | Ht 66.0 in | Wt 147.5 lb

## 2018-04-09 DIAGNOSIS — R269 Unspecified abnormalities of gait and mobility: Secondary | ICD-10-CM

## 2018-04-09 DIAGNOSIS — B004 Herpesviral encephalitis: Secondary | ICD-10-CM

## 2018-04-09 MED ORDER — CARBAMAZEPINE 100 MG PO CHEW: 100.0000 mg | CHEWABLE_TABLET | Freq: Every day | ORAL | 3 refills | Status: DC

## 2018-04-09 NOTE — Progress Notes (Signed)
Reason for visit: Encephalomyelitis  [name] is an 72 y.o. female  History of present illness:  Katelyn Lamb is a 72 year old right-handed white female with a history of encephalomyelitis.  The patient has a residual gait disorder, burning dysesthesias in the feet and pain in the legs.  The patient is not sleeping well at night because of this.  She remains on Lyrica taking 50 mg in the morning and 100 mg in the evening, higher doses resulted in significant worsening of gait instability.  She takes Cymbalta 60 mg twice daily.  She was recently in the hospital on 26 March 2018 with a urinary tract infection that had resulted in a significant decline in her functional level.  Lumbar puncture was done in the hospital and was completely unremarkable.  The patient in the past has been on Keppra for discomfort but could not tolerate the medication.  She returns this office for an evaluation.  The patient is at or near her usual baseline.  Past Medical History:  Diagnosis Date  . Anxiety   . Back pain   . Gait abnormality 11/14/2016  . Myelitis due to herpes simplex [facility])     Past Surgical History:  Procedure Laterality Date  . ABDOMINAL HYSTERECTOMY    . BLADDER REPAIR    . CESAREAN SECTION    . TUBAL LIGATION      Family History  Problem Relation Age of Onset  . Hypertension Mother     Social history:  reports that she has quit smoking. She has never used smokeless tobacco. She reports that she does not drink alcohol or use drugs.    Allergies  Allergen Reactions  . Demerol [Meperidine] Other (See Comments)    Hallucinations  . Percocet [Oxycodone-Acetaminophen] Itching  . Amoxicillin-Pot Clavulanate Diarrhea    Severe pain, headache, intestinal infection  . Penicillins Itching and Rash    Has patient had a PCN reaction causing immediate rash, facial/tongue/throat swelling, SOB or lightheadedness with hypotension:  NO Has patient had a PCN reaction causing severe rash  involving mucus membranes or skin necrosis: No Has patient had a PCN reaction that required hospitalization: No Has patient had a PCN reaction occurring within the last 10 years: Yes If all of the above answers are "NO", then may proceed with Cephalosporin use.    Medications:  Prior to Admission medications   Medication Sig Start Date End Date Taking? Authorizing Provider  acyclovir (ZOVIRAX) 400 MG tablet TAKE 1 TABLET BY MOUTH TWICE A DAY 01/24/18  Yes [name], MD  ALPRAZolam Duanne Moron) 1 MG tablet Take 1 mg by mouth at bedtime as needed for anxiety or sleep.  10/26/16  Yes [provider]  Biotin 10000 MCG TABS Take 1 tablet by mouth daily.   Yes [provider]  Calcium Carb-Cholecalciferol (CALCIUM+D3 PO) Take 1 tablet by mouth 2 (two) times daily. 1200 mg calcium +D3    Yes [provider]  conjugated estrogens (PREMARIN) vaginal cream Place 1 Applicatorful vaginally daily as needed (itching).    Yes [provider]  Cyanocobalamin (VITAMIN B12 PO) Take 1 tablet by mouth daily.   Yes [provider]  diazepam (VALIUM) 5 MG tablet Take 5 mg by mouth 2 (two) times daily.   Yes [provider]  diclofenac sodium (VOLTAREN) 1 % GEL APPLY 2 GRAM TO THE AFFECTED AREA(S) 4 TIMES PER DAY 02/10/18  Yes [provider]  DULoxetine (CYMBALTA) 60 MG capsule TAKE 1 CAPSULE (  60 MG TOTAL) BY MOUTH 2 (TWO) TIMES DAILY. 03/25/18  Yes [name], MD  HYDROcodone-acetaminophen [facility]) 10-325 MG tablet Take 1 tablet by mouth 3 (three) times daily as needed for severe pain.  03/07/18  Yes [provider]  Menthol, Topical Analgesic, (STOPAIN EX) Apply 1 application topically See admin instructions. Spray topically at bedtime as needed for pain   Yes [provider]  omega-3 acid ethyl esters (LOVAZA) 1 g capsule Take 1 capsule (1 g total) by mouth 2 (two) times daily. 10/26/16  Yes Angiulli, Lavon Paganini, PA-C  pantoprazole  (PROTONIX) 40 MG tablet Take 1 tablet (40 mg total) by mouth daily. 10/26/16  Yes Angiulli, Lavon Paganini, PA-C  polyethylene glycol (MIRALAX / GLYCOLAX) packet Take 17 g by mouth daily. Patient taking differently: Take 17 g by mouth daily as needed (constipation).  10/26/16  Yes Angiulli, Lavon Paganini, PA-C  pregabalin (LYRICA) 50 MG capsule TAKE 1 CAPSULE IN THE MORNING AND 2 CAPSULES IN THE EVENING 01/31/18  Yes [name], MD  promethazine (PHENERGAN) 25 MG suppository Place 25 mg rectally every 12 (twelve) hours as needed for nausea or vomiting (diarrha).  04/23/17  Yes [provider]  carbamazepine (TEGRETOL) 100 MG chewable tablet Chew 1 tablet (100 mg total) by mouth at bedtime. 04/09/18   [name], MD    ROS:  Out of a complete 14 system review of symptoms, the patient complains only of the following symptoms, and all other reviewed systems are negative.  Chills, fatigue, runny nose Wheezing Cold intolerance, excessive thirst Constipation, diarrhea, incontinence of the bowels Snoring, acting out dreams Back pain, walking difficulty Memory loss, dizziness, headache, numbness, weakness, tremors Confusion  Blood pressure 117/60, pulse 84, height 5\' 6"  (1.676 m), weight 147 lb 8 oz (66.9 kg).  Physical Exam  General: The patient is alert and cooperative at the time of the examination.  Skin: No significant peripheral edema is noted.   Neurologic Exam  Mental status: The patient is alert and oriented x 3 at the time of the examination. The patient has apparent normal recent and remote memory, with an apparently normal attention span and concentration ability.   Cranial nerves: Facial symmetry is present. Speech is normal, no aphasia or dysarthria is noted. Extraocular movements are full. Visual fields are full.  Motor: The patient has good strength in all 4 extremities.  Sensory examination: Soft touch sensation is symmetric on the face, arms, and  legs.  Coordination: The patient has good finger-nose-finger and heel-to-shin bilaterally.  Gait and station: The patient has a normal gait. Tandem gait is normal. Romberg is negative. No drift is seen.  Reflexes: Deep tendon reflexes are symmetric.   MRI brain 03/15/18:  IMPRESSION: This MRI of the brain with and without contrast shows the following: 1.  Extensive T2/FLAIR hyperintense foci in the hemispheres and in the pons with large confluencies in the frontal lobes.  The right frontal lobe changes have increased when compared to the 12/25/2016 MRI while the changes elsewhere are more stable.  These findings are consistent with chronic microvascular ischemic changes with superimposed more recent right frontal findings that could be due to the history of encephalomyelitis or chronic stroke.    2.  The extent of atrophy, especially in the frontal and temporal lobes, has increased when compared to the previous MRI from 12/25/2016. 3.  There are no acute findings.     * MRI scan images were reviewed online. I agree with the  written report.  When compared to MRI from [facility] in December 2018, there is no change on this current scan.   MRI cervical 03/15/2018:  IMPRESSION:  This MRI of the cervical spine with and without contrast shows the following: 1.    There are 3 foci within the spinal cord, anteriorly at the cervicomedullary junction, posteriorly at C3-C4 and centrally to posteriorly at C6.  All these foci were present on the 12/25/2016 MRI.  There is some enhancement of the focus adjacent to C3 though this has improved when compared to the 2018 MRI.  This focus is also mildly reduced in size compared to the 2018 MRI.  The MRI foci are likely due to sequela of the previous encephalomyelitis. 2.    There is moderate spinal stenosis at C5-C6 causing mild to moderate left foraminal narrowing but no definite nerve root compression.  3.    There is minimal retrolisthesis and mild spinal  stenosis with moderate bilateral foraminal narrowing at C6-C7 but no definite nerve root compression. 4.    There are milder degenerative changes as detailed above at the other cervical levels as detailed above.    Assessment/Plan:  1.  Encephalomyelitis  2.  Recent urinary tract infection  The patient is doing fairly well, she appears to be at her baseline.  She is still complaining quite a bit of pain and discomfort.  I will try low-dose carbamazepine, 100 mg at night.  She will call for any dose adjustments.  A prescription was sent in.  She will follow-up for next scheduled appointment in November 2019.  Jill Alexanders MD 04/09/2018 3:40 PM  [facility] [address] [place], [place] [address]  Phone [phone] Fax [phone]

## 2018-04-09 NOTE — Patient Instructions (Signed)
We will try low dose carbamazepine 100 mg at night.  Tegretol (carbamazepine) may result in dizziness, gait instability, cognitive slowing, or drowsiness. Sometimes, and allergic rash may occur, or a photosensitive rash may occur. If any significant side effects are noted, please contact our office.

## 2018-04-11 ENCOUNTER — Other Ambulatory Visit: Payer: Self-pay

## 2018-04-11 ENCOUNTER — Ambulatory Visit (HOSPITAL_COMMUNITY)
Admission: RE | Admit: 2018-04-11 | Discharge: 2018-04-11 | Disposition: A | Discharge status: Home / Self Care | Payer: PPO | Source: Ambulatory Visit | Attending: Interventional Radiology | Admitting: Interventional Radiology

## 2018-04-11 DIAGNOSIS — M4856XA Collapsed vertebra, not elsewhere classified, lumbar region, initial encounter for fracture: Secondary | ICD-10-CM | POA: Diagnosis not present

## 2018-04-11 DIAGNOSIS — M8588 Other specified disorders of bone density and structure, other site: Secondary | ICD-10-CM | POA: Diagnosis not present

## 2018-04-11 DIAGNOSIS — Z885 Allergy status to narcotic agent status: Secondary | ICD-10-CM | POA: Insufficient documentation

## 2018-04-11 DIAGNOSIS — F419 Anxiety disorder, unspecified: Secondary | ICD-10-CM | POA: Diagnosis not present

## 2018-04-11 DIAGNOSIS — S32020A Wedge compression fracture of second lumbar vertebra, initial encounter for closed fracture: Secondary | ICD-10-CM

## 2018-04-11 DIAGNOSIS — M8580 Other specified disorders of bone density and structure, unspecified site: Secondary | ICD-10-CM | POA: Diagnosis not present

## 2018-04-11 DIAGNOSIS — Z8249 Family history of ischemic heart disease and other diseases of the circulatory system: Secondary | ICD-10-CM | POA: Insufficient documentation

## 2018-04-11 DIAGNOSIS — Z87891 Personal history of nicotine dependence: Secondary | ICD-10-CM | POA: Insufficient documentation

## 2018-04-11 DIAGNOSIS — Z880 Allergy status to penicillin: Secondary | ICD-10-CM | POA: Diagnosis not present

## 2018-04-11 DIAGNOSIS — S32050A Wedge compression fracture of fifth lumbar vertebra, initial encounter for closed fracture: Secondary | ICD-10-CM

## 2018-04-11 HISTORY — PX: IR KYPHO LUMBAR INC FX REDUCE BONE BX UNI/BIL CANNULATION INC/IMAGING: IMG5519

## 2018-04-11 LAB — POCT I-STAT, CHEM 8
BUN: 14 mg/dL (ref 8–23)
CHLORIDE: 101 mmol/L (ref 98–111)
CREATININE: 0.9 mg/dL (ref 0.44–1.00)
Calcium, Ion: 1.27 mmol/L (ref 1.15–1.40)
GLUCOSE: 85 mg/dL (ref 70–99)
HCT: 32 % — ABNORMAL LOW (ref 36.0–46.0)
Hemoglobin: 10.9 g/dL — ABNORMAL LOW (ref 12.0–15.0)
POTASSIUM: 3.8 mmol/L (ref 3.5–5.1)
Sodium: 142 mmol/L (ref 135–145)
TCO2: 29 mmol/L (ref 22–32)

## 2018-04-11 LAB — APTT: aPTT: 31 seconds (ref 24–36)

## 2018-04-11 LAB — CBC
HEMATOCRIT: 38 % (ref 36.0–46.0)
Hemoglobin: 11.6 g/dL — ABNORMAL LOW (ref 12.0–15.0)
MCH: 29.7 pg (ref 26.0–34.0)
MCHC: 30.5 g/dL (ref 30.0–36.0)
MCV: 97.2 fL (ref 78.0–100.0)
Platelets: 265 10*3/uL (ref 150–400)
RBC: 3.91 MIL/uL (ref 3.87–5.11)
RDW: 13.8 % (ref 11.5–15.5)
WBC: 7.7 10*3/uL (ref 4.0–10.5)

## 2018-04-11 LAB — PROTIME-INR
INR: 1.03
Prothrombin Time: 13.4 seconds (ref 11.4–15.2)

## 2018-04-11 MED ORDER — MIDAZOLAM HCL 2 MG/2ML IJ SOLN
INTRAMUSCULAR | Status: AC
  Filled 2018-04-11: qty 2

## 2018-04-11 MED ORDER — BUPIVACAINE HCL (PF) 0.5 % IJ SOLN
INTRAMUSCULAR | Status: AC | PRN
  Administered 2018-04-11: 30 mL

## 2018-04-11 MED ORDER — HYDROMORPHONE HCL 1 MG/ML IJ SOLN
INTRAMUSCULAR | Status: AC | PRN
  Administered 2018-04-11: 1 mg via INTRAVENOUS

## 2018-04-11 MED ORDER — HYDROMORPHONE HCL 1 MG/ML IJ SOLN
INTRAMUSCULAR | Status: AC
  Filled 2018-04-11: qty 1

## 2018-04-11 MED ORDER — SODIUM CHLORIDE 0.9 % IV SOLN: INTRAVENOUS | Status: DC

## 2018-04-11 MED ORDER — IOPAMIDOL (ISOVUE-300) INJECTION 61%
INTRAVENOUS | Status: AC
  Administered 2018-04-11: 5 mL
  Filled 2018-04-11: qty 50

## 2018-04-11 MED ORDER — FENTANYL CITRATE (PF) 100 MCG/2ML IJ SOLN
INTRAMUSCULAR | Status: AC
  Filled 2018-04-11: qty 2

## 2018-04-11 MED ORDER — VANCOMYCIN HCL IN DEXTROSE 1-5 GM/200ML-% IV SOLN
1000.0000 mg | Freq: Once | INTRAVENOUS | Status: AC
  Administered 2018-04-11: 1000 mg via INTRAVENOUS

## 2018-04-11 MED ORDER — FENTANYL CITRATE (PF) 100 MCG/2ML IJ SOLN
INTRAMUSCULAR | Status: AC | PRN
  Administered 2018-04-11 (×2): 25 ug via INTRAVENOUS
  Administered 2018-04-11: 12.5 ug via INTRAVENOUS

## 2018-04-11 MED ORDER — MIDAZOLAM HCL 2 MG/2ML IJ SOLN
INTRAMUSCULAR | Status: AC | PRN
  Administered 2018-04-11 (×2): 0.5 mg via INTRAVENOUS
  Administered 2018-04-11: 1 mg via INTRAVENOUS

## 2018-04-11 MED ORDER — SODIUM CHLORIDE 0.9 % IV SOLN: INTRAVENOUS | Status: AC

## 2018-04-11 MED ORDER — TOBRAMYCIN SULFATE 1.2 G IJ SOLR
INTRAMUSCULAR | Status: AC
  Filled 2018-04-11: qty 1.2

## 2018-04-11 MED ORDER — BUPIVACAINE HCL (PF) 0.5 % IJ SOLN
INTRAMUSCULAR | Status: AC
  Filled 2018-04-11: qty 30

## 2018-04-11 MED ORDER — VANCOMYCIN HCL IN DEXTROSE 1-5 GM/200ML-% IV SOLN
INTRAVENOUS | Status: AC
  Filled 2018-04-11: qty 200

## 2018-04-11 NOTE — H&P (Signed)
Chief Complaint: Patient was seen in consultation today for lumbar 2 and lumbar 5 compression fractures.  Referring Physician(s): Suella Broad  Supervising Physician: Luanne Bras  Patient Status: [facility] - Out-pt  History of Present Illness: Katelyn Lamb is a 72 y.o. 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 She had a consultation with Dr. Estanislado Pandy 02/12/2018 regarding management of her lumbar 2 and lumbar 5 compression fractures.  Patient presents today for possible image-guided lumbar 2 and lumbar 5 kyphoplasties/vertebroplasties. Patient awake and alert laying in bed. Accompanied by husband and daughter. Complains of low back pain. States that her pain is rated 0/10 at this time, as she is laying flat. Complains of numbness/tingling down bilateral legs, stable at this time. Denies fever, chills, chest pain, dyspnea, abdominal pain, headache, dizziness, bladder/bowel incontinence, or urinary symptoms including dysuria or urinary frequency.   Past Medical History:  Diagnosis Date  . Anxiety   . Back pain   . Gait abnormality 11/14/2016  . Myelitis due to herpes simplex [facility])     Past Surgical History:  Procedure Laterality Date  . ABDOMINAL HYSTERECTOMY    . BLADDER REPAIR    . CESAREAN SECTION    . TUBAL LIGATION      Allergies: Demerol [meperidine]; Percocet [oxycodone-acetaminophen]; Amoxicillin-pot clavulanate; and Penicillins  Medications: Prior to Admission medications   Medication Sig Start Date End Date Taking? Authorizing Provider  acyclovir (ZOVIRAX) 400 MG tablet TAKE 1 TABLET BY MOUTH TWICE A DAY 01/24/18  Yes [name], MD  Biotin 10000 MCG TABS Take 1 tablet by mouth daily.   Yes [provider]  Calcium Carb-Cholecalciferol (CALCIUM+D3 PO) Take 1 tablet by mouth 2 (two) times daily. 1200 mg calcium +D3    Yes [provider]  carbamazepine (TEGRETOL) 100 MG chewable  tablet Chew 1 tablet (100 mg total) by mouth at bedtime. 04/09/18  Yes [name], MD  Cyanocobalamin (VITAMIN B12 PO) Take 1 tablet by mouth daily.   Yes [provider]  diazepam (VALIUM) 5 MG tablet Take 5 mg by mouth 2 (two) times daily.   Yes [provider]  diclofenac sodium (VOLTAREN) 1 % GEL APPLY 2 GRAM TO THE AFFECTED AREA(S) 4 TIMES PER DAY 02/10/18  Yes [provider]  DULoxetine (CYMBALTA) 60 MG capsule TAKE 1 CAPSULE (60 MG TOTAL) BY MOUTH 2 (TWO) TIMES DAILY. 03/25/18  Yes [name], MD  HYDROcodone-acetaminophen [facility]) 10-325 MG tablet Take 1 tablet by mouth 3 (three) times daily as needed for severe pain.  03/07/18  Yes [provider]  Menthol, Topical Analgesic, (STOPAIN EX) Apply 1 application topically See admin instructions. Spray topically at bedtime as needed for pain   Yes [provider]  omega-3 acid ethyl esters (LOVAZA) 1 g capsule Take 1 capsule (1 g total) by mouth 2 (two) times daily. 10/26/16  Yes Angiulli, Lavon Paganini, PA-C  pantoprazole (PROTONIX) 40 MG tablet Take 1 tablet (40 mg total) by mouth daily. 10/26/16  Yes Angiulli, Lavon Paganini, PA-C  polyethylene glycol (MIRALAX / GLYCOLAX) packet Take 17 g by mouth daily. Patient taking differently: Take 17 g by mouth daily as needed (constipation).  10/26/16  Yes Angiulli, Lavon Paganini, PA-C  pregabalin (LYRICA) 50 MG capsule TAKE 1 CAPSULE IN THE MORNING AND 2 CAPSULES IN THE EVENING 01/31/18  Yes [name], MD  ALPRAZolam Duanne Moron) 1 MG tablet Take 1 mg by mouth at bedtime as  needed for anxiety or sleep.  10/26/16   [provider]  conjugated estrogens (PREMARIN) vaginal cream Place 1 Applicatorful vaginally daily as needed (itching).     [provider]  promethazine (PHENERGAN) 25 MG suppository Place 25 mg rectally every 12 (twelve) hours as needed for nausea or vomiting (diarrha).  04/23/17   [provider]     Family History  Problem  Relation Age of Onset  . Hypertension Mother     Social History   Socioeconomic History  . Marital status: Married    Spouse name: [name]  . Number of children: 2  . Years of education: 49  . Highest education level: Not on file  Occupational History  . Not on file  Social Needs  . Financial resource strain: Not on file  . Food insecurity:    Worry: Not on file    Inability: Not on file  . Transportation needs:    Medical: Not on file    Non-medical: Not on file  Tobacco Use  . Smoking status: Former Research scientist (life sciences)  . Smokeless tobacco: Never Used  Substance and Sexual Activity  . Alcohol use: No  . Drug use: No  . Sexual activity: Not on file  Lifestyle  . Physical activity:    Days per week: Not on file    Minutes per session: Not on file  . Stress: Not on file  Relationships  . Social connections:    Talks on phone: Not on file    Gets together: Not on file    Attends religious service: Not on file    Active member of club or organization: Not on file    Attends meetings of clubs or organizations: Not on file    Relationship status: Not on file  Other Topics Concern  . Not on file  Social History Narrative   Lives with husband   Caffeine use: Green tea   Soda ocass   Coffee rare   Right-handed     Review of Systems: A 12 point ROS discussed and pertinent positives are indicated in the HPI above.  All other systems are negative.  Review of Systems  Constitutional: Negative for chills and fever.  Respiratory: Negative for shortness of breath and wheezing.   Cardiovascular: Negative for chest pain and palpitations.  Gastrointestinal: Negative for abdominal pain.       Negative for bowel incontinence.  Genitourinary: Negative for dysuria and frequency.       Negative for bladder incontinence.  Musculoskeletal: Positive for back pain.  Neurological: Positive for numbness. Negative for dizziness and headaches.  Psychiatric/Behavioral: Negative for behavioral  problems and confusion.    Vital Signs: BP 106/77   Pulse 83   Temp 98.2 F (36.8 C)   Ht 5\' 6"  (1.676 m)   Wt 140 lb (63.5 kg)   SpO2 94%   BMI 22.60 kg/m   Physical Exam  Constitutional: She is oriented to person, place, and time. She appears well-developed and well-nourished. No distress.  Cardiovascular: Normal rate, regular rhythm and normal heart sounds.  No murmur heard. Pulmonary/Chest: Effort normal and breath sounds normal. No respiratory distress. She has no wheezes.  Musculoskeletal:  Mild tenderness of midline lower back in lumbar region.  Neurological: She is alert and oriented to person, place, and time.  Skin: Skin is warm and dry.  Psychiatric: She has a normal mood and affect. Her behavior is normal. Judgment and thought content normal.  Nursing note and vitals reviewed.  MD Evaluation Airway: WNL Heart: WNL Abdomen: WNL Chest/ Lungs: WNL ASA  Classification: 3 Mallampati/Airway Score: Two   Imaging: Mr Jeri Cos EX Contrast  Result Date: 03/15/2018  [facility] [address] [place], [place] [address] [phone] NEUROIMAGING REPORT STUDY DATE: 03/14/2018 PATIENT NAME: DENEE BOEDER DOB: 09-18-1945 MRN: [id number] EXAM: MRI Brain with and without contrast ORDERING CLINICIAN: [name], MD CLINICAL HISTORY: 72 year old woman with history of encephalomyelitis with neurologic symptoms COMPARISON FILMS: MRI 12/25/2016 TECHNIQUE:MRI of the brain with and without contrast was obtained utilizing 5 mm axial slices with T1, T2, T2 flair, SWI and diffusion weighted views.  T1 sagittal, T2 coronal and postcontrast views in the axial and coronal plane were obtained. CONTRAST: 13 ml Multihance IMAGING SITE: [facility], [place]. FINDINGS: On sagittal images, the spinal cord is imaged caudally to C2-C3 and is normal in caliber.   The contents of the posterior fossa are of normal size and position.   The pituitary gland and  optic chiasm appear normal.    There is mild to moderate generalized cortical atrophy, increased in the frontal and temporal lobes when compared to the 12/25/2016 MRI.  There are no abnormal extra-axial collections of fluid.  The cerebellum appears normal.  There are T2/FLAIR hyperintense foci in the pons, unchanged compared to the 12/25/2016 MRI.  The deep gray matter appears normal.  There are extensive T2/FLAIR hyperintense foci in the hemispheres with confluent areas in the deep and periventricular white matter of the frontal lobes.  There has been significant progression of these changes in the right frontal lobe when compared to the previous MRI.  Diffusion weighted images are normal.  Susceptibility weighted images are normal.   The VIIth/VIIIth nerve complex appears normal.  There is a mild right mastoid effusion..  The paranasal sinuses appear normal.  There are bilateral lens replacements.  Otherwise the orbits appear normal.  Flow voids are identified within the major intracerebral arteries.  After the infusion of contrast material, a normal enhancement pattern is noted.   This MRI of the brain with and without contrast shows the following: 1.  Extensive T2/FLAIR hyperintense foci in the hemispheres and in the pons with large confluencies in the frontal lobes.  The right frontal lobe changes have increased when compared to the 12/25/2016 MRI while the changes elsewhere are more stable.  These findings are consistent with chronic microvascular ischemic changes with superimposed more recent right frontal findings that could be due to the history of encephalomyelitis or chronic stroke.   2.  The extent of atrophy, especially in the frontal and temporal lobes, has increased when compared to the previous MRI from 12/25/2016. 3.  There are no acute findings.   INTERPRETING PHYSICIAN: Richard A. Felecia Shelling, MD, PhD, FAAN Certified in  Neuroimaging by [employer]   Mr Cervical Spine W Wo  Contrast  Result Date: 03/15/2018  [facility] [address] [place], [place] [address] [phone] NEUROIMAGING REPORT STUDY DATE: 03/14/2018 PATIENT NAME: AUSET FRITZLER DOB: [birth year] MRN: [id number] EXAM: MRI of the cervical spine ORDERING CLINICIAN: [name], MD CLINICAL HISTORY: 72 year old woman with history of encephalomyelitis with neurologic symptoms COMPARISON FILMS: MRI of the cervical spine 12/25/2016 TECHNIQUE: MRI of the cervical spine was obtained utilizing 3 mm sagittal slices from the posterior fossa down to the T3-4 level with T1, T2 and inversion recovery views. In addition 4 mm axial slices from A2-6 down to  T1-2 level were included with T2 and gradient echo views. CONTRAST: 13 mL MultiHance IMAGING SITE: [facility], [place], [place], [name] FINDINGS: :  On sagittal images, the spine is imaged from above the cervicomedullary junction to T2.  There are T2 hyperintense foci within the the spinal cord anteriorly to the right at the cervical medullary junction, posteriorly adjacent to C3-C4 and posteriorly adjacent to C6.  The 3 foci were present on the 2018 MRI though the focus adjacent to C3-C4 is mildly reduced in size.  There is very minimal retrolisthesis of C6 upon C7.   There is a hemangioma within the T2 vertebral body.  There is loss of disc height at C3-C4, C4-C5, C5-C6 and C6-C7.  The discs and interspaces were further evaluated on axial views from C2 to T1 as follows: C2 - C3:  The disc and interspace appear normal. C3 - C4: There is disc protrusion and uncovertebral spurring causing mild bilateral foraminal narrowing but no nerve root compression or spinal stenosis. C4 - C5: There is disc bulging and uncovertebral spurring leading to mild left foraminal narrowing but no nerve root compression.. C5 - C6: There is moderate spinal stenosis due to a left paramedian disc protrusion and uncovertebral spurring.  There is mild to moderate left  and mild right foraminal narrowing but no nerve root compression. C6 - C7: There is 1 to 2 mm of retrolisthesis associated with disc bulging, uncovertebral spurring and ligamentum flavum hypertrophy causing mild spinal stenosis and moderate bilateral foraminal narrowing but no definite nerve root compression.. C7 - T1: There is mild left disc bulging but no spinal stenosis or nerve root compression.. After the infusion of contrast, there is some enhancement of the focus at C3, improved when compared to the 12/25/2016 MRI. When compared to the MRI dated 12/25/2016, the degenerative changes are essentially unchanged.    This MRI of the cervical spine with and without contrast shows the following: 1.    There are 3 foci within the spinal cord, anteriorly at the cervicomedullary junction, posteriorly at C3-C4 and centrally to posteriorly at C6.  All these foci were present on the 12/25/2016 MRI.  There is some enhancement of the focus adjacent to C3 though this has improved when compared to the 2018 MRI.  This focus is also mildly reduced in size compared to the 2018 MRI.  The MRI foci are likely due to sequela of the previous encephalomyelitis. 2.    There is moderate spinal stenosis at C5-C6 causing mild to moderate left foraminal narrowing but no definite nerve root compression. 3.    There is minimal retrolisthesis and mild spinal stenosis with moderate bilateral foraminal narrowing at C6-C7 but no definite nerve root compression. 4.    There are milder degenerative changes as detailed above at the other cervical levels as detailed above. INTERPRETING PHYSICIAN: Richard A. Felecia Shelling, MD, PhD, FAAN Certified in  Neuroimaging by [employer]   Dg Chest [place] 1 View  Result Date: 03/26/2018 CLINICAL DATA:  Fever, altered mental status and generalized weakness today. EXAM: PORTABLE CHEST 1 VIEW COMPARISON:  Chest x-ray dated 05/19/2014. FINDINGS: Heart size and mediastinal contours are within normal  limits. Lungs are clear. No pleural effusion or pneumothorax seen. Osseous structures about the chest are unremarkable. IMPRESSION: No active disease.  No evidence of pneumonia or pulmonary edema. Electronically Signed   By: Franki Cabot M.D.   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 With the patient prone, the lower back was prepped with Betadine. 1% Lidocaine was used for local anesthesia. Lumbar puncture was performed at the L3-4 level using a 20 gauge needle with return of clear CSF with an opening pressure of 20 cm water. Eighteen ml of CSF were obtained for laboratory studies. The patient tolerated the procedure well and there were no apparent complications. IMPRESSION: Successful lumbar puncture using fluoroscopic guidance. Electronically Signed   By: Franchot Gallo M.D.   On: 03/27/2018 14:42    Labs:  CBC: Recent Labs    03/26/18 1926 03/27/18 0415 03/28/18 0410  WBC 19.5* 15.6* 8.3  HGB 12.7 10.9* 10.6*  HCT 40.0 34.4* 33.5*  PLT 231 217 213    COAGS: Recent Labs    03/26/18 1926  INR 1.06    BMP: Recent Labs    03/26/18 1926 03/27/18 0415 03/28/18 0410  NA 136 140 141  K 4.1 3.8 3.7  CL 100 107 108  CO2 26 27 26   GLUCOSE 150* 103* 90  BUN 14 12 12   CALCIUM 9.0 8.3* 8.7*  CREATININE 0.91 0.76 0.80  GFRNONAA >60 >60 >60  GFRAA >60 >60 >60    LIVER FUNCTION TESTS: Recent Labs    03/26/18 1926  BILITOT 0.4  AST 31  ALT 18  ALKPHOS  122  PROT 7.2  ALBUMIN 3.7    TUMOR MARKERS: No results for input(s): AFPTM, CEA, CA199, CHROMGRNA in the last 8760 hours.  Assessment and Plan:  Lumbar 2 and lumbar 5 compression fractures. Plan for image-guided lumbar 2 and lumbar 5 kyphoplasties/vertebroplasties today with Dr. Estanislado Pandy. Patient is NPO. Denies fever. She does not take blood thinners. INR pending.  Risks and benefits of lumbar 2 and lumbar 5 kyphoplasties/vertebroplasties were discussed with the patient including, but not limited to education regarding the natural healing process of compression fractures without intervention, bleeding, infection, cement migration which may cause spinal cord damage, paralysis, pulmonary embolism or even death. This interventional procedure involves the use of X-rays and because of the nature of the planned procedure, it is possible that we will have prolonged use of X-ray fluoroscopy. Potential radiation risks to you include (but are not limited to) the following: - A slightly elevated risk for cancer  several years later in life. This risk is typically less than 0.5% percent. This risk is low in comparison to the normal incidence of human cancer, which is 33% for women and 50% for men according to the [place]. - Radiation induced injury can include skin redness, resembling a rash, tissue breakdown / ulcers and hair loss (which can be temporary or permanent).  The likelihood of either of these occurring depends on the difficulty of the procedure and whether you are sensitive to radiation due to previous procedures, disease, or genetic conditions.  IF your procedure requires a prolonged use of radiation, you will be notified and given written instructions for further action.  It is your responsibility to monitor the irradiated area for the 2 weeks following the procedure and to notify your physician if you are concerned  that you have suffered a radiation induced injury.   All  of the patient's questions were answered, patient is agreeable to proceed. Consent signed and in chart.   Thank you for this interesting consult.  I greatly enjoyed meeting Markiyah I Pennypacker and look forward to participating in their care.  A copy of this report was sent to the requesting provider on this date.  Electronically Signed: Earley Abide, PA-C 04/11/2018, 10:17 AM   I spent a total of 25 Minutes in face to face in clinical consultation, greater than 50% of which was counseling/coordinating care for lumbar 2 and lumbar 5 compression fractures.

## 2018-04-11 NOTE — Procedures (Signed)
S/P L2 and L 5 balloon kyphoplasty

## 2018-04-11 NOTE — Discharge Instructions (Signed)
KYPHOPLASTY/VERTEBROPLASTY DISCHARGE INSTRUCTIONS  Medications: (check all that apply)     Resume all home medications as before procedure.       Resume your (aspirin/Plavix/Coumadin) on                  Continue your pain medications as prescribed as needed.  Over the next 3-5 days, decrease your pain medication as tolerated.  Over the counter medications (i.e. Tylenol, ibuprofen, and aleve) may be substituted once severe/moderate pain symptoms have subsided.   Wound Care: Bandages may be removed the day following your procedure.  You may get your incision wet once bandages are removed.  Bandaids may be used to cover the incisions until scab formation.  Topical ointments are optional.  If you develop a fever greater than 101 degrees, have increased skin redness at the incision sites or pus-like oozing from incisions occurring within 1 week of the procedure, contact radiology at [phone] or [phone].  Ice pack to back for 15-20 minutes 2-3 time per day for first 2-3 days post procedure.  The ice will expedite muscle healing and help with the pain from the incisions.   Activity: Bedrest today with limited activity for 24 hours post procedure.  No driving for 48 hours.  Increase your activity as tolerated after bedrest (with assistance if necessary).  Refrain from any strenuous activity or heavy lifting (greater than 10 lbs.).   Follow up: Contact radiology at [phone] or [phone] if any questions/concerns.  A physician assistant from radiology will contact you in approximately 1 week.  If a biopsy was performed at the time of your procedure, your referring physician should receive the results in usually 2-3 days.         

## 2018-04-15 ENCOUNTER — Encounter (HOSPITAL_COMMUNITY): Payer: Self-pay | Admitting: Interventional Radiology

## 2018-04-15 ENCOUNTER — Other Ambulatory Visit: Payer: Self-pay | Admitting: Gastroenterology

## 2018-04-15 DIAGNOSIS — K5641 Fecal impaction: Secondary | ICD-10-CM

## 2018-04-16 ENCOUNTER — Ambulatory Visit
Admission: RE | Admit: 2018-04-16 | Discharge: 2018-04-16 | Disposition: A | Discharge status: Home / Self Care | Payer: PPO | Source: Ambulatory Visit | Attending: Gastroenterology | Admitting: Gastroenterology

## 2018-04-16 DIAGNOSIS — K5641 Fecal impaction: Secondary | ICD-10-CM

## 2018-04-16 DIAGNOSIS — R197 Diarrhea, unspecified: Secondary | ICD-10-CM | POA: Diagnosis not present

## 2018-04-24 DIAGNOSIS — R350 Frequency of micturition: Secondary | ICD-10-CM | POA: Diagnosis not present

## 2018-05-20 DIAGNOSIS — Z1231 Encounter for screening mammogram for malignant neoplasm of breast: Secondary | ICD-10-CM | POA: Diagnosis not present

## 2018-05-20 DIAGNOSIS — Z6825 Body mass index (BMI) 25.0-25.9, adult: Secondary | ICD-10-CM | POA: Diagnosis not present

## 2018-05-20 DIAGNOSIS — Z124 Encounter for screening for malignant neoplasm of cervix: Secondary | ICD-10-CM | POA: Diagnosis not present

## 2018-05-20 DIAGNOSIS — Z779 Other contact with and (suspected) exposures hazardous to health: Secondary | ICD-10-CM | POA: Diagnosis not present

## 2018-05-30 ENCOUNTER — Other Ambulatory Visit: Payer: Self-pay | Admitting: Neurology

## 2018-06-22 ENCOUNTER — Other Ambulatory Visit: Payer: Self-pay | Admitting: Neurology

## 2018-07-07 DIAGNOSIS — M25512 Pain in left shoulder: Secondary | ICD-10-CM | POA: Diagnosis not present

## 2018-07-08 DIAGNOSIS — R197 Diarrhea, unspecified: Secondary | ICD-10-CM | POA: Diagnosis not present

## 2018-07-16 DIAGNOSIS — R300 Dysuria: Secondary | ICD-10-CM | POA: Diagnosis not present

## 2018-07-21 ENCOUNTER — Other Ambulatory Visit: Payer: Self-pay | Admitting: Neurology

## 2018-07-21 DIAGNOSIS — G0490 Encephalitis and encephalomyelitis, unspecified: Secondary | ICD-10-CM

## 2018-07-21 DIAGNOSIS — G8222 Paraplegia, incomplete: Secondary | ICD-10-CM

## 2018-07-24 ENCOUNTER — Encounter: Payer: Self-pay | Admitting: Neurology

## 2018-07-24 ENCOUNTER — Ambulatory Visit (INDEPENDENT_AMBULATORY_CARE_PROVIDER_SITE_OTHER): Payer: PPO | Admitting: Neurology

## 2018-07-24 VITALS — BP 106/76 | HR 90 | Resp 18 | Ht 66.0 in | Wt 140.0 lb

## 2018-07-24 DIAGNOSIS — R269 Unspecified abnormalities of gait and mobility: Secondary | ICD-10-CM | POA: Diagnosis not present

## 2018-07-24 DIAGNOSIS — G0490 Encephalitis and encephalomyelitis, unspecified: Secondary | ICD-10-CM | POA: Diagnosis not present

## 2018-07-24 NOTE — Progress Notes (Signed)
Reason for visit: Encephalomyelitis, gait disorder  Katelyn Lamb is an 72 y.o. female  History of present illness:  Katelyn Lamb is a 72 year old right-handed white female with a history of encephalomyelitis that has left her with neuropathic pain in the legs and hands.  The patient has a chronic gait disorder, she is falling more frequently over the last 6 or 7 weeks.  This seems to correlate when she was placed on oxybutynin for her bladder.  The patient has significant issues with urinary frequency and incontinence.  She uses a walker for ambulation, but she is still falling frequently.  The patient will oftentimes fall when she tries to bend over to pick up something.  Her leg pain has recently increased, she was followed by Dr. Nelva Bush for chronic pain, she gained some improvement with epidural steroid injections previously.  The patient returns to this office for an evaluation.  She claims that she cannot get her walker into the bathroom area as it is too wide.  Past Medical History:  Diagnosis Date  . Anxiety   . Back pain   . Gait abnormality 11/14/2016  . Myelitis due to herpes simplex [facility])     Past Surgical History:  Procedure Laterality Date  . ABDOMINAL HYSTERECTOMY    . BLADDER REPAIR    . CESAREAN SECTION    . IR KYPHO LUMBAR INC FX REDUCE BONE BX UNI/BIL CANNULATION INC/IMAGING  04/11/2018  . TUBAL LIGATION      Family History  Problem Relation Age of Onset  . Hypertension Mother     Social history:  reports that she has quit smoking. She has never used smokeless tobacco. She reports that she does not drink alcohol or use drugs.    Allergies  Allergen Reactions  . Demerol [Meperidine] Other (See Comments)    Hallucinations  . Percocet [Oxycodone-Acetaminophen] Itching  . Amoxicillin-Pot Clavulanate Diarrhea    Severe pain, headache, intestinal infection  . Penicillins Itching and Rash    Has patient had a PCN reaction causing immediate rash, facial/tongue/throat  swelling, SOB or lightheadedness with hypotension:  NO Has patient had a PCN reaction causing severe rash involving mucus membranes or skin necrosis: No Has patient had a PCN reaction that required hospitalization: No Has patient had a PCN reaction occurring within the last 10 years: Yes If all of the above answers are "NO", then may proceed with Cephalosporin use.    Medications:  Prior to Admission medications   Medication Sig Start Date End Date Taking? Authorizing Provider  acyclovir (ZOVIRAX) 400 MG tablet TAKE 1 TABLET BY MOUTH TWICE A DAY 07/22/18  Yes [name], MD  Biotin 10000 MCG TABS Take 1 tablet by mouth daily.   Yes [provider]  Calcium Carb-Cholecalciferol (CALCIUM+D3 PO) Take 1 tablet by mouth 2 (two) times daily. 1200 mg calcium +D3    Yes [provider]  conjugated estrogens (PREMARIN) vaginal cream Place 1 Applicatorful vaginally daily as needed (itching).    Yes [provider]  diazepam (VALIUM) 5 MG tablet Take 5 mg by mouth 2 (two) times daily.   Yes [provider]  diclofenac sodium (VOLTAREN) 1 % GEL APPLY 2 GRAM TO THE AFFECTED AREA(S) 4 TIMES PER DAY 02/10/18  Yes [provider]  DULoxetine (CYMBALTA) 60 MG capsule TAKE 1 CAPSULE (60 MG TOTAL) BY MOUTH 2 (TWO) TIMES DAILY. 06/23/18  Yes [name], MD  HYDROcodone-acetaminophen [facility]) 10-325 MG tablet Take 1 tablet by  mouth 3 (three) times daily as needed for severe pain.  03/07/18  Yes [provider]  Menthol, Topical Analgesic, (STOPAIN EX) Apply 1 application topically See admin instructions. Spray topically at bedtime as needed for pain   Yes [provider]  omega-3 acid ethyl esters (LOVAZA) 1 g capsule Take 1 capsule (1 g total) by mouth 2 (two) times daily. 10/26/16  Yes Angiulli, Lavon Paganini, PA-C  oxybutynin (DITROPAN-XL) 5 MG 24 hr tablet Take 5 mg by mouth at bedtime.   Yes [provider]  pantoprazole (PROTONIX) 40  MG tablet Take 1 tablet (40 mg total) by mouth daily. 10/26/16  Yes Angiulli, Lavon Paganini, PA-C  polyethylene glycol (MIRALAX / GLYCOLAX) packet Take 17 g by mouth daily. Patient taking differently: Take 17 g by mouth daily as needed (constipation).  10/26/16  Yes Angiulli, Lavon Paganini, PA-C  pregabalin (LYRICA) 50 MG capsule TAKE 1 CAPSULE IN THE MORNING AND 2 CAPSULES IN THE EVENING 01/31/18  Yes [name], MD    ROS:  Out of a complete 14 system review of symptoms, the patient complains only of the following symptoms, and all other reviewed systems are negative.  Walking problems Leg pain  There were no vitals taken for this visit.  Physical Exam  General: The patient is alert and cooperative at the time of the examination.  Skin: No significant peripheral edema is noted.   Neurologic Exam  Mental status: The patient is alert and oriented x 3 at the time of the examination. The patient has apparent normal recent and remote memory, with an apparently normal attention span and concentration ability.   Cranial nerves: Facial symmetry is present. Speech is normal, no aphasia or dysarthria is noted. Extraocular movements are full. Visual fields are full.  Motor: The patient has good strength in all 4 extremities.  Sensory examination: Soft touch sensation is symmetric on the face, arms, and legs.  Coordination: The patient has good finger-nose-finger and heel-to-shin bilaterally.  Gait and station: The patient has a wide-based, unsteady gait.  The patient is not able to perform tandem gait.  Romberg is positive.  Reflexes: Deep tendon reflexes are symmetric.   Assessment/Plan:  1.  History of encephalomyelitis  2.  Gait disorder, multiple falls  3.  Neurogenic bladder  The patient is to use a 3 in 1 chair at bedside at night so she does not have to walk down the hall to the bathroom.  We will give her a prescription for a Rollator to hopefully access the bathroom more  safely.  The patient will be set up for home health physical therapy for gait training.  She will follow-up through this office in 6 months.  She is to try to take the oxybutynin in the evening hours rather than in the morning.  Jill Alexanders MD 07/24/2018 2:27 PM  [facility] [address] [place], [place] [address]  Phone [phone] Fax [phone]

## 2018-07-25 DIAGNOSIS — M545 Low back pain: Secondary | ICD-10-CM | POA: Diagnosis not present

## 2018-07-25 DIAGNOSIS — Z5181 Encounter for therapeutic drug level monitoring: Secondary | ICD-10-CM | POA: Diagnosis not present

## 2018-07-25 DIAGNOSIS — G894 Chronic pain syndrome: Secondary | ICD-10-CM | POA: Diagnosis not present

## 2018-07-25 DIAGNOSIS — Z79899 Other long term (current) drug therapy: Secondary | ICD-10-CM | POA: Diagnosis not present

## 2018-07-25 DIAGNOSIS — M5136 Other intervertebral disc degeneration, lumbar region: Secondary | ICD-10-CM | POA: Diagnosis not present

## 2018-07-25 DIAGNOSIS — M542 Cervicalgia: Secondary | ICD-10-CM | POA: Diagnosis not present

## 2018-07-25 DIAGNOSIS — M4856XD Collapsed vertebra, not elsewhere classified, lumbar region, subsequent encounter for fracture with routine healing: Secondary | ICD-10-CM | POA: Diagnosis not present

## 2018-08-03 ENCOUNTER — Other Ambulatory Visit: Payer: Self-pay | Admitting: Neurology

## 2018-08-04 ENCOUNTER — Telehealth: Payer: Self-pay | Admitting: Neurology

## 2018-08-04 NOTE — Telephone Encounter (Signed)
Noted/fim 

## 2018-08-04 NOTE — Telephone Encounter (Signed)
FYI-Kumar, therapist with [facility] home health calling to advise service for the patient will begin on 08-07-18.

## 2018-08-07 DIAGNOSIS — Z9181 History of falling: Secondary | ICD-10-CM | POA: Diagnosis not present

## 2018-08-07 DIAGNOSIS — M549 Dorsalgia, unspecified: Secondary | ICD-10-CM | POA: Diagnosis not present

## 2018-08-07 DIAGNOSIS — N319 Neuromuscular dysfunction of bladder, unspecified: Secondary | ICD-10-CM | POA: Diagnosis not present

## 2018-08-07 DIAGNOSIS — F419 Anxiety disorder, unspecified: Secondary | ICD-10-CM | POA: Diagnosis not present

## 2018-08-07 DIAGNOSIS — B004 Herpesviral encephalitis: Secondary | ICD-10-CM | POA: Diagnosis not present

## 2018-08-14 ENCOUNTER — Telehealth: Payer: Self-pay | Admitting: Neurology

## 2018-08-14 NOTE — Telephone Encounter (Signed)
PT K[facility]n [facility] @ [facility] has called stating he did his evaluation of pt on 12-5 Plan is 1 time a week for 1 week 2 times a week for 3 weeks and 1 time a week for 1 week PT Katelyn Lamb is asking for verbal orders for an evaluation of Occupational Therapy please call

## 2018-08-14 NOTE — Telephone Encounter (Signed)
I called physical therapy, I gave verbal order for the pain for physical therapy.

## 2018-08-14 NOTE — Telephone Encounter (Signed)
Dr. Jannifer Franklin, see message and please advise if PT orders are agreeable.

## 2018-08-18 DIAGNOSIS — F419 Anxiety disorder, unspecified: Secondary | ICD-10-CM | POA: Diagnosis not present

## 2018-08-18 DIAGNOSIS — B004 Herpesviral encephalitis: Secondary | ICD-10-CM | POA: Diagnosis not present

## 2018-08-18 DIAGNOSIS — Z9181 History of falling: Secondary | ICD-10-CM | POA: Diagnosis not present

## 2018-08-18 DIAGNOSIS — N319 Neuromuscular dysfunction of bladder, unspecified: Secondary | ICD-10-CM | POA: Diagnosis not present

## 2018-08-18 DIAGNOSIS — M549 Dorsalgia, unspecified: Secondary | ICD-10-CM | POA: Diagnosis not present

## 2018-08-21 DIAGNOSIS — K219 Gastro-esophageal reflux disease without esophagitis: Secondary | ICD-10-CM | POA: Diagnosis not present

## 2018-08-21 DIAGNOSIS — K5730 Diverticulosis of large intestine without perforation or abscess without bleeding: Secondary | ICD-10-CM | POA: Diagnosis not present

## 2018-08-21 DIAGNOSIS — K580 Irritable bowel syndrome with diarrhea: Secondary | ICD-10-CM | POA: Diagnosis not present

## 2018-08-21 DIAGNOSIS — Z86010 Personal history of colonic polyps: Secondary | ICD-10-CM | POA: Diagnosis not present

## 2018-09-02 DIAGNOSIS — M545 Low back pain: Secondary | ICD-10-CM | POA: Diagnosis not present

## 2018-09-02 DIAGNOSIS — M4310 Spondylolisthesis, site unspecified: Secondary | ICD-10-CM | POA: Diagnosis not present

## 2018-09-02 DIAGNOSIS — M5136 Other intervertebral disc degeneration, lumbar region: Secondary | ICD-10-CM | POA: Diagnosis not present

## 2018-09-04 DIAGNOSIS — B004 Herpesviral encephalitis: Secondary | ICD-10-CM | POA: Diagnosis not present

## 2018-09-04 DIAGNOSIS — F419 Anxiety disorder, unspecified: Secondary | ICD-10-CM | POA: Diagnosis not present

## 2018-09-04 DIAGNOSIS — N319 Neuromuscular dysfunction of bladder, unspecified: Secondary | ICD-10-CM | POA: Diagnosis not present

## 2018-09-04 DIAGNOSIS — Z9181 History of falling: Secondary | ICD-10-CM | POA: Diagnosis not present

## 2018-09-04 DIAGNOSIS — M549 Dorsalgia, unspecified: Secondary | ICD-10-CM | POA: Diagnosis not present

## 2018-09-08 NOTE — Telephone Encounter (Signed)
PT K[facility]n [facility] @ [facility] has called stating he would like a verbal authorization to extend [facility] physical therapy for 2 times a week for 1 week and 1 time a week for 2 weeks. Katelyn Lamb states okay to leave voicemail.

## 2018-09-08 NOTE — Telephone Encounter (Signed)
I called and gave verbal order for the physical therapy.

## 2018-09-18 ENCOUNTER — Telehealth: Payer: Self-pay | Admitting: Neurology

## 2018-09-18 DIAGNOSIS — R194 Change in bowel habit: Secondary | ICD-10-CM | POA: Diagnosis not present

## 2018-09-18 DIAGNOSIS — R140 Abdominal distension (gaseous): Secondary | ICD-10-CM | POA: Diagnosis not present

## 2018-09-18 DIAGNOSIS — R197 Diarrhea, unspecified: Secondary | ICD-10-CM | POA: Diagnosis not present

## 2018-09-18 NOTE — Telephone Encounter (Signed)
Dwyane Dee is calling to get a verbal ok to add OT for this pt. Please advise.

## 2018-09-18 NOTE — Telephone Encounter (Signed)
I contacted Dwyane Dee with [facility] and provided verbal orders for OT.

## 2018-09-22 ENCOUNTER — Other Ambulatory Visit: Payer: Self-pay | Admitting: Neurology

## 2018-09-23 ENCOUNTER — Telehealth: Payer: Self-pay | Admitting: Neurology

## 2018-09-23 DIAGNOSIS — M549 Dorsalgia, unspecified: Secondary | ICD-10-CM | POA: Diagnosis not present

## 2018-09-23 DIAGNOSIS — Z9181 History of falling: Secondary | ICD-10-CM | POA: Diagnosis not present

## 2018-09-23 DIAGNOSIS — F419 Anxiety disorder, unspecified: Secondary | ICD-10-CM | POA: Diagnosis not present

## 2018-09-23 DIAGNOSIS — B004 Herpesviral encephalitis: Secondary | ICD-10-CM | POA: Diagnosis not present

## 2018-09-23 DIAGNOSIS — N319 Neuromuscular dysfunction of bladder, unspecified: Secondary | ICD-10-CM | POA: Diagnosis not present

## 2018-09-23 NOTE — Telephone Encounter (Signed)
Don from [facility] called to inform that the OT was evaluated the 17th Fri. Asking for one week 1, two week 1 and one week 1 for ADL incontinence management . Katelyn Lamb is needing verbal ok to proceed. Please advise.

## 2018-09-23 NOTE — Telephone Encounter (Signed)
I contacted Timmothy Sours with [facility] and provided verbal order.

## 2018-09-26 ENCOUNTER — Telehealth: Payer: Self-pay | Admitting: Neurology

## 2018-09-26 NOTE — Telephone Encounter (Signed)
Don from [facility] called stating there was a missed OT visit on 1/23. Spouse reports pts has been having rough nights and was to tiered yesterday. Request ok to add one visit for next week since yesterday was missed. Timmothy Sours is seeing pt today as part of the plan.

## 2018-09-26 NOTE — Telephone Encounter (Signed)
I spoke with Dr. Jannifer Franklin, he is agreeable to adding on one extra visit. I called Timmothy Sours, OT with [facility] and advised him of this information.

## 2018-10-21 ENCOUNTER — Telehealth: Payer: Self-pay | Admitting: Neurology

## 2018-10-21 NOTE — Telephone Encounter (Addendum)
I contacted the pt's husband and lvm stating we could see the pt on 10/29/18 for a 7:30 appt and a check in time of 7 am. Husband advised to call back if this time/date worked for the pt.

## 2018-10-21 NOTE — Telephone Encounter (Signed)
Pts husband called wanting to see provider sooner than what is scheduled due to the pt worsening. Pt is not recognizing her husband, she states that people have visited the home when no one has come to visit etc. Husband would really want her checked. Please advise.

## 2018-11-03 DIAGNOSIS — R338 Other retention of urine: Secondary | ICD-10-CM | POA: Diagnosis not present

## 2018-11-03 DIAGNOSIS — R3914 Feeling of incomplete bladder emptying: Secondary | ICD-10-CM | POA: Diagnosis not present

## 2018-11-03 DIAGNOSIS — N3942 Incontinence without sensory awareness: Secondary | ICD-10-CM | POA: Diagnosis not present

## 2018-11-03 DIAGNOSIS — N139 Obstructive and reflux uropathy, unspecified: Secondary | ICD-10-CM | POA: Diagnosis not present

## 2018-11-03 DIAGNOSIS — R350 Frequency of micturition: Secondary | ICD-10-CM | POA: Diagnosis not present

## 2018-11-12 DIAGNOSIS — R338 Other retention of urine: Secondary | ICD-10-CM | POA: Diagnosis not present

## 2018-11-19 ENCOUNTER — Encounter: Payer: Self-pay | Admitting: Neurology

## 2018-12-04 DIAGNOSIS — M25512 Pain in left shoulder: Secondary | ICD-10-CM | POA: Diagnosis not present

## 2018-12-04 DIAGNOSIS — Z9981 Dependence on supplemental oxygen: Secondary | ICD-10-CM | POA: Diagnosis not present

## 2018-12-04 DIAGNOSIS — F329 Major depressive disorder, single episode, unspecified: Secondary | ICD-10-CM | POA: Diagnosis not present

## 2018-12-04 DIAGNOSIS — G2581 Restless legs syndrome: Secondary | ICD-10-CM | POA: Diagnosis not present

## 2018-12-04 DIAGNOSIS — D1801 Hemangioma of skin and subcutaneous tissue: Secondary | ICD-10-CM | POA: Diagnosis not present

## 2018-12-04 DIAGNOSIS — D235 Other benign neoplasm of skin of trunk: Secondary | ICD-10-CM | POA: Diagnosis not present

## 2018-12-04 DIAGNOSIS — E7800 Pure hypercholesterolemia, unspecified: Secondary | ICD-10-CM | POA: Diagnosis not present

## 2018-12-04 DIAGNOSIS — J449 Chronic obstructive pulmonary disease, unspecified: Secondary | ICD-10-CM | POA: Diagnosis not present

## 2018-12-04 DIAGNOSIS — Z87891 Personal history of nicotine dependence: Secondary | ICD-10-CM | POA: Diagnosis not present

## 2018-12-04 DIAGNOSIS — R531 Weakness: Secondary | ICD-10-CM | POA: Diagnosis not present

## 2018-12-04 DIAGNOSIS — N390 Urinary tract infection, site not specified: Secondary | ICD-10-CM | POA: Diagnosis not present

## 2018-12-04 DIAGNOSIS — L720 Epidermal cyst: Secondary | ICD-10-CM | POA: Diagnosis not present

## 2018-12-04 DIAGNOSIS — L820 Inflamed seborrheic keratosis: Secondary | ICD-10-CM | POA: Diagnosis not present

## 2018-12-04 DIAGNOSIS — Z466 Encounter for fitting and adjustment of urinary device: Secondary | ICD-10-CM | POA: Diagnosis not present

## 2018-12-04 DIAGNOSIS — I872 Venous insufficiency (chronic) (peripheral): Secondary | ICD-10-CM | POA: Diagnosis not present

## 2018-12-04 DIAGNOSIS — F0280 Dementia in other diseases classified elsewhere without behavioral disturbance: Secondary | ICD-10-CM | POA: Diagnosis not present

## 2018-12-04 DIAGNOSIS — Z85828 Personal history of other malignant neoplasm of skin: Secondary | ICD-10-CM | POA: Diagnosis not present

## 2018-12-04 DIAGNOSIS — L812 Freckles: Secondary | ICD-10-CM | POA: Diagnosis not present

## 2018-12-04 DIAGNOSIS — M47816 Spondylosis without myelopathy or radiculopathy, lumbar region: Secondary | ICD-10-CM | POA: Diagnosis not present

## 2018-12-04 DIAGNOSIS — G20 Parkinson's disease: Secondary | ICD-10-CM | POA: Diagnosis not present

## 2018-12-04 DIAGNOSIS — I6523 Occlusion and stenosis of bilateral carotid arteries: Secondary | ICD-10-CM | POA: Diagnosis not present

## 2018-12-04 DIAGNOSIS — I4819 Other persistent atrial fibrillation: Secondary | ICD-10-CM | POA: Diagnosis not present

## 2018-12-04 DIAGNOSIS — F419 Anxiety disorder, unspecified: Secondary | ICD-10-CM | POA: Diagnosis not present

## 2018-12-04 DIAGNOSIS — I214 Non-ST elevation (NSTEMI) myocardial infarction: Secondary | ICD-10-CM | POA: Diagnosis not present

## 2018-12-04 DIAGNOSIS — I6389 Other cerebral infarction: Secondary | ICD-10-CM | POA: Diagnosis not present

## 2018-12-04 DIAGNOSIS — L821 Other seborrheic keratosis: Secondary | ICD-10-CM | POA: Diagnosis not present

## 2018-12-04 DIAGNOSIS — I10 Essential (primary) hypertension: Secondary | ICD-10-CM | POA: Diagnosis not present

## 2018-12-04 DIAGNOSIS — R499 Unspecified voice and resonance disorder: Secondary | ICD-10-CM | POA: Diagnosis not present

## 2018-12-04 DIAGNOSIS — M25569 Pain in unspecified knee: Secondary | ICD-10-CM | POA: Diagnosis not present

## 2018-12-04 DIAGNOSIS — Z23 Encounter for immunization: Secondary | ICD-10-CM | POA: Diagnosis not present

## 2018-12-04 DIAGNOSIS — Z9181 History of falling: Secondary | ICD-10-CM | POA: Diagnosis not present

## 2018-12-04 DIAGNOSIS — D485 Neoplasm of uncertain behavior of skin: Secondary | ICD-10-CM | POA: Diagnosis not present

## 2018-12-04 DIAGNOSIS — I739 Peripheral vascular disease, unspecified: Secondary | ICD-10-CM | POA: Diagnosis not present

## 2018-12-04 DIAGNOSIS — Z20828 Contact with and (suspected) exposure to other viral communicable diseases: Secondary | ICD-10-CM | POA: Diagnosis not present

## 2018-12-04 DIAGNOSIS — G894 Chronic pain syndrome: Secondary | ICD-10-CM | POA: Diagnosis not present

## 2018-12-04 DIAGNOSIS — L570 Actinic keratosis: Secondary | ICD-10-CM | POA: Diagnosis not present

## 2018-12-04 DIAGNOSIS — L97811 Non-pressure chronic ulcer of other part of right lower leg limited to breakdown of skin: Secondary | ICD-10-CM | POA: Diagnosis not present

## 2018-12-04 DIAGNOSIS — M545 Low back pain: Secondary | ICD-10-CM | POA: Diagnosis not present

## 2018-12-04 DIAGNOSIS — Z87440 Personal history of urinary (tract) infections: Secondary | ICD-10-CM | POA: Diagnosis not present

## 2018-12-10 DIAGNOSIS — N3942 Incontinence without sensory awareness: Secondary | ICD-10-CM | POA: Diagnosis not present

## 2018-12-11 DIAGNOSIS — Z87440 Personal history of urinary (tract) infections: Secondary | ICD-10-CM | POA: Diagnosis not present

## 2018-12-11 DIAGNOSIS — N390 Urinary tract infection, site not specified: Secondary | ICD-10-CM | POA: Diagnosis not present

## 2018-12-11 DIAGNOSIS — Z466 Encounter for fitting and adjustment of urinary device: Secondary | ICD-10-CM | POA: Diagnosis not present

## 2018-12-14 ENCOUNTER — Other Ambulatory Visit: Payer: Self-pay | Admitting: Neurology

## 2018-12-18 DIAGNOSIS — N3942 Incontinence without sensory awareness: Secondary | ICD-10-CM | POA: Diagnosis not present

## 2018-12-31 DIAGNOSIS — Z87440 Personal history of urinary (tract) infections: Secondary | ICD-10-CM | POA: Diagnosis not present

## 2018-12-31 DIAGNOSIS — Z466 Encounter for fitting and adjustment of urinary device: Secondary | ICD-10-CM | POA: Diagnosis not present

## 2018-12-31 DIAGNOSIS — N390 Urinary tract infection, site not specified: Secondary | ICD-10-CM | POA: Diagnosis not present

## 2019-01-01 DIAGNOSIS — R197 Diarrhea, unspecified: Secondary | ICD-10-CM | POA: Diagnosis not present

## 2019-01-01 DIAGNOSIS — G8222 Paraplegia, incomplete: Secondary | ICD-10-CM | POA: Diagnosis not present

## 2019-01-01 DIAGNOSIS — R2689 Other abnormalities of gait and mobility: Secondary | ICD-10-CM | POA: Diagnosis not present

## 2019-01-09 DIAGNOSIS — N3942 Incontinence without sensory awareness: Secondary | ICD-10-CM | POA: Diagnosis not present

## 2019-01-12 DIAGNOSIS — R197 Diarrhea, unspecified: Secondary | ICD-10-CM | POA: Diagnosis not present

## 2019-01-14 DIAGNOSIS — N390 Urinary tract infection, site not specified: Secondary | ICD-10-CM | POA: Diagnosis not present

## 2019-01-22 ENCOUNTER — Other Ambulatory Visit: Payer: Self-pay | Admitting: Neurology

## 2019-01-22 DIAGNOSIS — G0490 Encephalitis and encephalomyelitis, unspecified: Secondary | ICD-10-CM

## 2019-01-22 DIAGNOSIS — G8222 Paraplegia, incomplete: Secondary | ICD-10-CM

## 2019-01-27 ENCOUNTER — Other Ambulatory Visit: Payer: Self-pay | Admitting: Neurology

## 2019-02-12 DIAGNOSIS — N390 Urinary tract infection, site not specified: Secondary | ICD-10-CM | POA: Diagnosis not present

## 2019-02-12 DIAGNOSIS — Z87440 Personal history of urinary (tract) infections: Secondary | ICD-10-CM | POA: Diagnosis not present

## 2019-02-12 DIAGNOSIS — Z466 Encounter for fitting and adjustment of urinary device: Secondary | ICD-10-CM | POA: Diagnosis not present

## 2019-02-16 DIAGNOSIS — N3942 Incontinence without sensory awareness: Secondary | ICD-10-CM | POA: Diagnosis not present

## 2019-02-17 ENCOUNTER — Telehealth: Payer: Self-pay

## 2019-02-17 NOTE — Telephone Encounter (Signed)
Waiting on 2 week opening.

## 2019-02-17 NOTE — Telephone Encounter (Signed)
-----   Message from [name], MD sent at 02/16/2019  5:46 PM EDT ----- Please set up a face-to-face visit with this patient sometime in the next week or 2.  Thank you.

## 2019-02-19 ENCOUNTER — Ambulatory Visit: Payer: PPO | Admitting: Neurology

## 2019-02-23 DIAGNOSIS — E7800 Pure hypercholesterolemia, unspecified: Secondary | ICD-10-CM | POA: Diagnosis not present

## 2019-02-23 DIAGNOSIS — G8222 Paraplegia, incomplete: Secondary | ICD-10-CM | POA: Diagnosis not present

## 2019-02-23 DIAGNOSIS — Z0001 Encounter for general adult medical examination with abnormal findings: Secondary | ICD-10-CM | POA: Diagnosis not present

## 2019-02-23 DIAGNOSIS — R2689 Other abnormalities of gait and mobility: Secondary | ICD-10-CM | POA: Diagnosis not present

## 2019-02-23 DIAGNOSIS — Z79899 Other long term (current) drug therapy: Secondary | ICD-10-CM | POA: Diagnosis not present

## 2019-02-23 DIAGNOSIS — M792 Neuralgia and neuritis, unspecified: Secondary | ICD-10-CM | POA: Diagnosis not present

## 2019-02-23 DIAGNOSIS — F411 Generalized anxiety disorder: Secondary | ICD-10-CM | POA: Diagnosis not present

## 2019-02-23 NOTE — Telephone Encounter (Signed)
I reached out to the pt and scheduled her two week f/u appt for 03/02/19 at 330 pm.

## 2019-02-24 DIAGNOSIS — G8222 Paraplegia, incomplete: Secondary | ICD-10-CM | POA: Diagnosis not present

## 2019-02-24 DIAGNOSIS — N3942 Incontinence without sensory awareness: Secondary | ICD-10-CM | POA: Diagnosis not present

## 2019-02-25 DIAGNOSIS — M5136 Other intervertebral disc degeneration, lumbar region: Secondary | ICD-10-CM | POA: Diagnosis not present

## 2019-02-25 DIAGNOSIS — M545 Low back pain: Secondary | ICD-10-CM | POA: Diagnosis not present

## 2019-02-26 DIAGNOSIS — E7800 Pure hypercholesterolemia, unspecified: Secondary | ICD-10-CM | POA: Diagnosis not present

## 2019-02-26 DIAGNOSIS — Z79899 Other long term (current) drug therapy: Secondary | ICD-10-CM | POA: Diagnosis not present

## 2019-03-02 ENCOUNTER — Encounter: Payer: Self-pay | Admitting: Neurology

## 2019-03-02 ENCOUNTER — Ambulatory Visit (INDEPENDENT_AMBULATORY_CARE_PROVIDER_SITE_OTHER): Payer: PPO | Admitting: Neurology

## 2019-03-02 ENCOUNTER — Other Ambulatory Visit: Payer: Self-pay

## 2019-03-02 VITALS — BP 100/75 | HR 75 | Temp 97.7°F | Ht 66.0 in | Wt 158.0 lb

## 2019-03-02 DIAGNOSIS — R413 Other amnesia: Secondary | ICD-10-CM

## 2019-03-02 DIAGNOSIS — R269 Unspecified abnormalities of gait and mobility: Secondary | ICD-10-CM | POA: Diagnosis not present

## 2019-03-02 DIAGNOSIS — E538 Deficiency of other specified B group vitamins: Secondary | ICD-10-CM

## 2019-03-02 DIAGNOSIS — G9340 Encephalopathy, unspecified: Secondary | ICD-10-CM | POA: Diagnosis not present

## 2019-03-02 DIAGNOSIS — M792 Neuralgia and neuritis, unspecified: Secondary | ICD-10-CM | POA: Diagnosis not present

## 2019-03-02 DIAGNOSIS — G0490 Encephalitis and encephalomyelitis, unspecified: Secondary | ICD-10-CM

## 2019-03-02 HISTORY — DX: Other amnesia: R41.3

## 2019-03-02 MED ORDER — PREGABALIN 50 MG PO CAPS: 100.0000 mg | ORAL_CAPSULE | Freq: Every day | ORAL | 1 refills | Status: DC

## 2019-03-02 MED ORDER — DULOXETINE HCL 60 MG PO CPEP: 60.0000 mg | ORAL_CAPSULE | Freq: Every day | ORAL | 1 refills | Status: DC

## 2019-03-02 MED ORDER — DULOXETINE HCL 30 MG PO CPEP: 30.0000 mg | ORAL_CAPSULE | Freq: Every day | ORAL | 1 refills | Status: DC

## 2019-03-02 NOTE — Patient Instructions (Signed)
With the duloxitine, we will cut back to 30 mg in the morning and 60 mg in the evening.  With the lyrica, cut back to 100 mg at night only.

## 2019-03-02 NOTE — Progress Notes (Signed)
Reason for visit: Encephalomyelitis  Katelyn Lamb is an 73 y.o. female  History of present illness:  Katelyn Lamb is a 73 year old right-handed white female with a history of encephalomyelitis associated with a gait disorder and neurogenic bladder and some mild cognitive changes.  The patient returns with her husband today.  The family believes that the patient has had some ongoing decline over the last 6 months of some memory issues.  She is having vivid dreams at night and cannot distinguish reality from the dreams at times.  At other times, she will not recognize her husband and believes that it is someone who looks like her husband but is not.  She will have hallucinations at times.  The patient does have ongoing gait instability, she will fall occasionally, previously she has sustained compression fractures of the spine because of the falls.  The patient remains on diazepam 5 mg twice daily for tremor and she takes Lyrica 50 mg in the morning and 100 mg in the evening along with Cymbalta 60 mg twice daily for neuropathic pain.  The patient requires assistance with bathing and dressing, she walks only minimally during the day, she last had physical therapy for her walking in January 2020.  The patient has an indwelling catheter for her bladder, she is to undergo urodynamic studies at some point in the future.  Past Medical History:  Diagnosis Date  . Anxiety   . Back pain   . Gait abnormality 11/14/2016  . Myelitis due to herpes simplex [facility])     Past Surgical History:  Procedure Laterality Date  . ABDOMINAL HYSTERECTOMY    . BLADDER REPAIR    . CESAREAN SECTION    . IR KYPHO LUMBAR INC FX REDUCE BONE BX UNI/BIL CANNULATION INC/IMAGING  04/11/2018  . TUBAL LIGATION      Family History  Problem Relation Age of Onset  . Hypertension Mother     Social history:  reports that she has quit smoking. She has never used smokeless tobacco. She reports that she does not drink alcohol or use  drugs.    Allergies  Allergen Reactions  . Demerol [Meperidine] Other (See Comments)    Hallucinations  . Percocet [Oxycodone-Acetaminophen] Itching  . Amoxicillin-Pot Clavulanate Diarrhea    Severe pain, headache, intestinal infection  . Penicillins Itching and Rash    Has patient had a PCN reaction causing immediate rash, facial/tongue/throat swelling, SOB or lightheadedness with hypotension:  NO Has patient had a PCN reaction causing severe rash involving mucus membranes or skin necrosis: No Has patient had a PCN reaction that required hospitalization: No Has patient had a PCN reaction occurring within the last 10 years: Yes If all of the above answers are "NO", then may proceed with Cephalosporin use.    Medications:  Prior to Admission medications   Medication Sig Start Date End Date Taking? Authorizing Provider  acyclovir (ZOVIRAX) 400 MG tablet TAKE 1 TABLET BY MOUTH TWICE A DAY 01/22/19  Yes [name], MD  Biotin 10000 MCG TABS Take 1 tablet by mouth daily.   Yes [provider]  Calcium Carb-Cholecalciferol (CALCIUM+D3 PO) Take 1 tablet by mouth 2 (two) times daily. 1200 mg calcium +D3    Yes [provider]  conjugated estrogens (PREMARIN) vaginal cream Place 1 Applicatorful vaginally daily as needed (itching).    Yes [provider]  diazepam (VALIUM) 5 MG tablet Take 5 mg by mouth 2 (two) times daily.   Yes [provider]  diclofenac sodium (VOLTAREN) 1 % GEL APPLY 2 GRAM TO THE AFFECTED AREA(S) 4 TIMES PER DAY 02/10/18  Yes [provider]  diphenoxylate-atropine (LOMOTIL) 2.5-0.025 MG tablet  02/27/19  Yes [provider]  DULoxetine (CYMBALTA) 60 MG capsule TAKE 1 CAPSULE BY MOUTH TWICE A DAY 12/15/18  Yes [name], MD  HYDROcodone-acetaminophen (NORCO) 10-325 MG tablet Take 1 tablet by mouth 3 (three) times daily as needed for severe pain.  03/07/18  Yes [provider]  Menthol, Topical  Analgesic, (STOPAIN EX) Apply 1 application topically See admin instructions. Spray topically at bedtime as needed for pain   Yes [provider]  omega-3 acid ethyl esters (LOVAZA) 1 g capsule Take 1 capsule (1 g total) by mouth 2 (two) times daily. Patient taking differently: Take 1 g by mouth daily.  10/26/16  Yes Angiulli, Lavon Paganini, PA-C  pantoprazole (PROTONIX) 40 MG tablet Take 1 tablet (40 mg total) by mouth daily. 10/26/16  Yes Angiulli, Lavon Paganini, PA-C  polyethylene glycol (MIRALAX / GLYCOLAX) packet Take 17 g by mouth daily. Patient taking differently: Take 17 g by mouth daily as needed (constipation).  10/26/16  Yes Angiulli, Lavon Paganini, PA-C  pregabalin (LYRICA) 50 MG capsule TAKE 1 CAPSULE BY MOUTH IN THE MORNING AND TAKE 2 CAPSULES BY MOUTH IN THE EVENING. 01/28/19  Yes [name], MD    ROS:  Out of a complete 14 system review of symptoms, the patient complains only of the following symptoms, and all other reviewed systems are negative.  Memory problems Hallucinations Walking problem Neurogenic bladder  Blood pressure 100/75, pulse 75, temperature 97.7 F (36.5 C), temperature source Temporal, height 5\' 6"  (1.676 m), weight 158 lb (71.7 kg).  Physical Exam  General: The patient is alert and cooperative at the time of the examination.  Skin: No significant peripheral edema is noted.   Neurologic Exam  Mental status: The patient is alert and oriented x 3 at the time of the examination. The Mini-Mental status examination done today shows a total score 23/30.   Cranial nerves: Facial symmetry is present. Speech is normal, no aphasia or dysarthria is noted. Extraocular movements are full. Visual fields are full.  Motor: The patient has good strength in all 4 extremities.  Sensory examination: Soft touch sensation is symmetric on the face, arms, and legs.  Coordination: The patient has good finger-nose-finger and heel-to-shin bilaterally.  Gait and station:  The patient has a wide-based gait, the patient is able to walk with minimal assistance.  Tandem gait was not attempted.  Romberg is negative.  Reflexes: Deep tendon reflexes are symmetric.   Assessment/Plan:  1.  Encephalomyelitis  2.  Gait disorder  3.  History of neuropathic pain  4.  Low back pain, history of compression fractures  5.  Memory disturbance  The patient will be set up for blood work today, she will have a repeat MRI of the brain and cervical spine, the family believes that she has had some cognitive changes over the last 6 months or so.  We will try to reduce some of her medications, she claims that some of the neuropathic pain has improved.  We will reduce the Lyrica to taking only 100 mg daily at nighttime, and reduce the Cymbalta taking 30 mg in the morning and 60 mg in the evening.  She will follow-up in 5 or 6 months.  Jill Alexanders MD 03/02/2019 3:50 PM  [facility] [address]  [place], [place] [address]  Phone [phone] Fax [phone]

## 2019-03-03 ENCOUNTER — Telehealth: Payer: Self-pay | Admitting: Neurology

## 2019-03-03 NOTE — Telephone Encounter (Signed)
Health team order sent to GI. No auth they will reach out to the patient to schedule.  

## 2019-03-04 LAB — COMPREHENSIVE METABOLIC PANEL
ALT: 10 IU/L (ref 0–32)
AST: 25 IU/L (ref 0–40)
Albumin/Globulin Ratio: 1.6 (ref 1.2–2.2)
Albumin: 4.2 g/dL (ref 3.7–4.7)
Alkaline Phosphatase: 109 IU/L (ref 39–117)
BUN/Creatinine Ratio: 9 — ABNORMAL LOW (ref 12–28)
BUN: 8 mg/dL (ref 8–27)
Bilirubin Total: 0.2 mg/dL (ref 0.0–1.2)
CO2: 22 mmol/L (ref 20–29)
Calcium: 9.5 mg/dL (ref 8.7–10.3)
Chloride: 98 mmol/L (ref 96–106)
Creatinine, Ser: 0.9 mg/dL (ref 0.57–1.00)
GFR calc Af Amer: 74 mL/min/{1.73_m2} (ref >59)
GFR calc non Af Amer: 65 mL/min/{1.73_m2} (ref >59)
Globulin, Total: 2.6 g/dL (ref 1.5–4.5)
Glucose: 85 mg/dL (ref 65–99)
Potassium: 4.4 mmol/L (ref 3.5–5.2)
Sodium: 137 mmol/L (ref 134–144)
Total Protein: 6.8 g/dL (ref 6.0–8.5)

## 2019-03-04 LAB — RPR: RPR Ser Ql: NONREACTIVE

## 2019-03-04 LAB — SEDIMENTATION RATE: Sed Rate: 32 mm/hr (ref 0–40)

## 2019-03-04 LAB — COPPER, SERUM: Copper: 104 ug/dL (ref 72–166)

## 2019-03-04 LAB — VITAMIN B12: Vitamin B-12: 1403 pg/mL — ABNORMAL HIGH (ref 232–1245)

## 2019-03-05 DIAGNOSIS — Z466 Encounter for fitting and adjustment of urinary device: Secondary | ICD-10-CM | POA: Diagnosis not present

## 2019-03-05 DIAGNOSIS — N390 Urinary tract infection, site not specified: Secondary | ICD-10-CM | POA: Diagnosis not present

## 2019-03-05 DIAGNOSIS — Z87440 Personal history of urinary (tract) infections: Secondary | ICD-10-CM | POA: Diagnosis not present

## 2019-03-09 DIAGNOSIS — G8222 Paraplegia, incomplete: Secondary | ICD-10-CM | POA: Diagnosis not present

## 2019-03-09 DIAGNOSIS — N3942 Incontinence without sensory awareness: Secondary | ICD-10-CM | POA: Diagnosis not present

## 2019-03-11 DIAGNOSIS — N390 Urinary tract infection, site not specified: Secondary | ICD-10-CM | POA: Diagnosis not present

## 2019-03-11 DIAGNOSIS — Z466 Encounter for fitting and adjustment of urinary device: Secondary | ICD-10-CM | POA: Diagnosis not present

## 2019-03-11 DIAGNOSIS — Z87440 Personal history of urinary (tract) infections: Secondary | ICD-10-CM | POA: Diagnosis not present

## 2019-03-17 DIAGNOSIS — R338 Other retention of urine: Secondary | ICD-10-CM | POA: Diagnosis not present

## 2019-03-30 ENCOUNTER — Other Ambulatory Visit: Payer: Self-pay

## 2019-03-30 ENCOUNTER — Telehealth: Payer: Self-pay | Admitting: Neurology

## 2019-03-30 ENCOUNTER — Ambulatory Visit
Admission: RE | Admit: 2019-03-30 | Discharge: 2019-03-30 | Disposition: A | Discharge status: Home / Self Care | Payer: PPO | Source: Ambulatory Visit | Attending: Neurology | Admitting: Neurology

## 2019-03-30 DIAGNOSIS — G9340 Encephalopathy, unspecified: Secondary | ICD-10-CM

## 2019-03-30 DIAGNOSIS — M792 Neuralgia and neuritis, unspecified: Secondary | ICD-10-CM

## 2019-03-30 DIAGNOSIS — R413 Other amnesia: Secondary | ICD-10-CM

## 2019-03-30 DIAGNOSIS — G0490 Encephalitis and encephalomyelitis, unspecified: Secondary | ICD-10-CM

## 2019-03-30 DIAGNOSIS — R269 Unspecified abnormalities of gait and mobility: Secondary | ICD-10-CM | POA: Diagnosis not present

## 2019-03-30 MED ORDER — GADOBENATE DIMEGLUMINE 529 MG/ML IV SOLN
14.0000 mL | Freq: Once | INTRAVENOUS | Status: AC | PRN
  Administered 2019-03-30: 14 mL via INTRAVENOUS

## 2019-03-30 NOTE — Telephone Encounter (Signed)
Called the patient.  The MRI of the brain and cervical spine showed no change from 2019 evaluations, still some slight enhancement in the spinal cord at the C3 level.   MRI brain 03/30/19:  IMPRESSION: This MRI of the brain with and without contrast shows the following: 1.   Extensive T2/flair hyperintense foci in the hemispheres most consistent with advanced chronic microvascular ischemic change.  The distribution and extent are similar to changes noted on the 2019 MRI. 2.   Atrophy, similar in extent to the 2019 MRI. 3.   There is a normal enhancement pattern and no acute findings.    MRI cervical 03/30/19:  IMPRESSION: This MRI of the cervical spine with and without contrast shows the following: 1.   There is abnormal signal within the spinal cord anteriorly at the cervical medullary junction, posteriorly from C2-C3 through C3-C4 and posteriorly adjacent to C6-C7.  The changes are similar to the MRI from 2019.  This abnormality is nonspecific and could be due to a metabolic disturbance such as B12 or copper deficiency or the sequela of infectious or inflammatory disease including idiopathic transverse myelitis, HIV, HTLV 1/2, Sjogren's syndrome or anti-Hu or other paraneoplastic syndrome. 2.   Compared to the 03/14/2018 MRI, the spinal cord findings are similar. 3.   There is moderately severe spinal stenosis at C5-C6, mild spinal stenosis at C6-C7 and borderline spinal stenosis at C4-C5.  Degenerative changes are similar to the previous MRI.  There is no definite nerve root compression. 4.   After contrast is administered, there is subtle enhancement of the focus adjacent to C3, similar to the 2019 MRI or slightly improved.

## 2019-04-02 DIAGNOSIS — R338 Other retention of urine: Secondary | ICD-10-CM | POA: Diagnosis not present

## 2019-04-02 DIAGNOSIS — N139 Obstructive and reflux uropathy, unspecified: Secondary | ICD-10-CM | POA: Diagnosis not present

## 2019-04-08 DIAGNOSIS — Z466 Encounter for fitting and adjustment of urinary device: Secondary | ICD-10-CM | POA: Diagnosis not present

## 2019-04-08 DIAGNOSIS — Z87440 Personal history of urinary (tract) infections: Secondary | ICD-10-CM | POA: Diagnosis not present

## 2019-04-08 DIAGNOSIS — N390 Urinary tract infection, site not specified: Secondary | ICD-10-CM | POA: Diagnosis not present

## 2019-04-09 DIAGNOSIS — W19XXXA Unspecified fall, initial encounter: Secondary | ICD-10-CM | POA: Diagnosis not present

## 2019-04-09 DIAGNOSIS — S7001XA Contusion of right hip, initial encounter: Secondary | ICD-10-CM | POA: Diagnosis not present

## 2019-04-09 DIAGNOSIS — M25551 Pain in right hip: Secondary | ICD-10-CM | POA: Diagnosis not present

## 2019-04-15 DIAGNOSIS — Z87440 Personal history of urinary (tract) infections: Secondary | ICD-10-CM | POA: Diagnosis not present

## 2019-04-15 DIAGNOSIS — N390 Urinary tract infection, site not specified: Secondary | ICD-10-CM | POA: Diagnosis not present

## 2019-04-15 DIAGNOSIS — Z466 Encounter for fitting and adjustment of urinary device: Secondary | ICD-10-CM | POA: Diagnosis not present

## 2019-04-17 ENCOUNTER — Ambulatory Visit: Payer: PPO | Admitting: Neurology

## 2019-05-06 DIAGNOSIS — N3942 Incontinence without sensory awareness: Secondary | ICD-10-CM | POA: Diagnosis not present

## 2019-05-06 DIAGNOSIS — R159 Full incontinence of feces: Secondary | ICD-10-CM | POA: Diagnosis not present

## 2019-05-06 DIAGNOSIS — R3914 Feeling of incomplete bladder emptying: Secondary | ICD-10-CM | POA: Diagnosis not present

## 2019-05-06 DIAGNOSIS — M62838 Other muscle spasm: Secondary | ICD-10-CM | POA: Diagnosis not present

## 2019-05-06 DIAGNOSIS — M6281 Muscle weakness (generalized): Secondary | ICD-10-CM | POA: Diagnosis not present

## 2019-05-06 DIAGNOSIS — R152 Fecal urgency: Secondary | ICD-10-CM | POA: Diagnosis not present

## 2019-05-22 DIAGNOSIS — M65311 Trigger thumb, right thumb: Secondary | ICD-10-CM | POA: Diagnosis not present

## 2019-05-25 DIAGNOSIS — S20211A Contusion of right front wall of thorax, initial encounter: Secondary | ICD-10-CM | POA: Diagnosis not present

## 2019-05-25 DIAGNOSIS — S0033XA Contusion of nose, initial encounter: Secondary | ICD-10-CM | POA: Diagnosis not present

## 2019-06-02 DIAGNOSIS — R3914 Feeling of incomplete bladder emptying: Secondary | ICD-10-CM | POA: Diagnosis not present

## 2019-06-02 DIAGNOSIS — N3942 Incontinence without sensory awareness: Secondary | ICD-10-CM | POA: Diagnosis not present

## 2019-06-02 DIAGNOSIS — R152 Fecal urgency: Secondary | ICD-10-CM | POA: Diagnosis not present

## 2019-06-02 DIAGNOSIS — M62838 Other muscle spasm: Secondary | ICD-10-CM | POA: Diagnosis not present

## 2019-06-02 DIAGNOSIS — M6281 Muscle weakness (generalized): Secondary | ICD-10-CM | POA: Diagnosis not present

## 2019-06-02 DIAGNOSIS — R350 Frequency of micturition: Secondary | ICD-10-CM | POA: Diagnosis not present

## 2019-06-03 ENCOUNTER — Telehealth: Payer: Self-pay

## 2019-06-03 NOTE — Telephone Encounter (Signed)
Letter mailed from [facility] to pt's home address asking pt to call in and reschedule 09/07/2019 appt due to providers schedule changing. Appt on that date has been canceled.

## 2019-06-05 DIAGNOSIS — R300 Dysuria: Secondary | ICD-10-CM | POA: Diagnosis not present

## 2019-06-05 DIAGNOSIS — R350 Frequency of micturition: Secondary | ICD-10-CM | POA: Diagnosis not present

## 2019-06-08 ENCOUNTER — Ambulatory Visit: Payer: PPO | Admitting: Podiatry

## 2019-06-16 DIAGNOSIS — B373 Candidiasis of vulva and vagina: Secondary | ICD-10-CM | POA: Diagnosis not present

## 2019-06-17 ENCOUNTER — Ambulatory Visit: Payer: PPO | Admitting: Podiatry

## 2019-06-17 ENCOUNTER — Other Ambulatory Visit: Payer: Self-pay

## 2019-06-17 DIAGNOSIS — M79676 Pain in unspecified toe(s): Secondary | ICD-10-CM | POA: Diagnosis not present

## 2019-06-17 DIAGNOSIS — B351 Tinea unguium: Secondary | ICD-10-CM

## 2019-06-17 DIAGNOSIS — N951 Menopausal and female climacteric states: Secondary | ICD-10-CM | POA: Insufficient documentation

## 2019-06-17 DIAGNOSIS — F411 Generalized anxiety disorder: Secondary | ICD-10-CM | POA: Insufficient documentation

## 2019-06-22 NOTE — Progress Notes (Signed)
   SUBJECTIVE Patient presents to office today complaining of elongated, thickened nails that cause pain while ambulating in shoes. She is unable to trim her own nails. Patient is here for further evaluation and treatment.  Past Medical History:  Diagnosis Date  . Anxiety   . Back pain   . Gait abnormality 11/14/2016  . Memory difficulty 03/02/2019  . Myelitis due to herpes simplex [facility])     OBJECTIVE General Patient is awake, alert, and oriented x 3 and in no acute distress. Derm Skin is dry and supple bilateral. Negative open lesions or macerations. Remaining integument unremarkable. Nails are tender, long, thickened and dystrophic with subungual debris, consistent with onychomycosis, 1-5 bilateral. No signs of infection noted. Vasc  DP and PT pedal pulses palpable bilaterally. Temperature gradient within normal limits.  Neuro Epicritic and protective threshold sensation grossly intact bilaterally.  Musculoskeletal Exam No symptomatic pedal deformities noted bilateral. Muscular strength within normal limits.  ASSESSMENT 1. Onychodystrophic nails 1-5 bilateral with hyperkeratosis of nails.  2. Onychomycosis of nail due to dermatophyte bilateral 3. Pain in foot bilateral  PLAN OF CARE 1. Patient evaluated today.  2. Instructed to maintain good pedal hygiene and foot care.  3. Mechanical debridement of nails 1-5 bilaterally performed using a nail nipper. Filed with dremel without incident.  4. Return to clinic in 3 mos.    Edrick Kins, DPM [facility]  Dr. Edrick Kins, [address]                                        [place], [place] [address]                Office [phone]  Fax [phone]

## 2019-07-06 DIAGNOSIS — Z79899 Other long term (current) drug therapy: Secondary | ICD-10-CM | POA: Diagnosis not present

## 2019-07-06 DIAGNOSIS — M545 Low back pain: Secondary | ICD-10-CM | POA: Diagnosis not present

## 2019-07-06 DIAGNOSIS — G894 Chronic pain syndrome: Secondary | ICD-10-CM | POA: Diagnosis not present

## 2019-07-06 DIAGNOSIS — M542 Cervicalgia: Secondary | ICD-10-CM | POA: Diagnosis not present

## 2019-07-06 DIAGNOSIS — M5136 Other intervertebral disc degeneration, lumbar region: Secondary | ICD-10-CM | POA: Diagnosis not present

## 2019-07-13 ENCOUNTER — Other Ambulatory Visit: Payer: Self-pay | Admitting: Neurology

## 2019-07-13 DIAGNOSIS — G0490 Encephalitis and encephalomyelitis, unspecified: Secondary | ICD-10-CM

## 2019-07-13 DIAGNOSIS — G8222 Paraplegia, incomplete: Secondary | ICD-10-CM

## 2019-07-22 ENCOUNTER — Telehealth: Payer: Self-pay | Admitting: Neurology

## 2019-07-22 NOTE — Telephone Encounter (Signed)
Pt's husband called stating that he is needing to speak to the provider about several things going on with the pt and he would like to be advised if it has to do with what she is being treated for or if it may be something else. He states that she is having trouble standing and walking by herself and he thinks she may also have a UTI. Please advise.

## 2019-07-22 NOTE — Telephone Encounter (Signed)
I contacted the pt's husband and lvm. I advised him Dr. Jannifer Franklin is out of the office until 07/27/2019. I advised him I would be happy to speak with him or we could wait until Dr. Jannifer Franklin returned.  I asked him to call back to see how he would like to proceed.

## 2019-07-23 ENCOUNTER — Inpatient Hospital Stay (HOSPITAL_COMMUNITY): Payer: PPO

## 2019-07-23 ENCOUNTER — Other Ambulatory Visit: Payer: Self-pay

## 2019-07-23 ENCOUNTER — Inpatient Hospital Stay (HOSPITAL_COMMUNITY)
Admission: EM | Admit: 2019-07-23 | Discharge: 2019-07-28 | DRG: 871 | Disposition: A | Discharge status: Home Health | Payer: PPO | Attending: Internal Medicine | Admitting: Internal Medicine

## 2019-07-23 ENCOUNTER — Emergency Department (HOSPITAL_COMMUNITY): Payer: PPO

## 2019-07-23 ENCOUNTER — Encounter (HOSPITAL_COMMUNITY): Payer: Self-pay | Admitting: Emergency Medicine

## 2019-07-23 DIAGNOSIS — M549 Dorsalgia, unspecified: Secondary | ICD-10-CM | POA: Diagnosis not present

## 2019-07-23 DIAGNOSIS — F329 Major depressive disorder, single episode, unspecified: Secondary | ICD-10-CM | POA: Diagnosis not present

## 2019-07-23 DIAGNOSIS — R3129 Other microscopic hematuria: Secondary | ICD-10-CM | POA: Diagnosis not present

## 2019-07-23 DIAGNOSIS — Z880 Allergy status to penicillin: Secondary | ICD-10-CM | POA: Diagnosis not present

## 2019-07-23 DIAGNOSIS — T361X5A Adverse effect of cephalosporins and other beta-lactam antibiotics, initial encounter: Secondary | ICD-10-CM | POA: Diagnosis not present

## 2019-07-23 DIAGNOSIS — R0689 Other abnormalities of breathing: Secondary | ICD-10-CM | POA: Diagnosis not present

## 2019-07-23 DIAGNOSIS — Z20828 Contact with and (suspected) exposure to other viral communicable diseases: Secondary | ICD-10-CM | POA: Diagnosis not present

## 2019-07-23 DIAGNOSIS — Z86718 Personal history of other venous thrombosis and embolism: Secondary | ICD-10-CM

## 2019-07-23 DIAGNOSIS — Z8249 Family history of ischemic heart disease and other diseases of the circulatory system: Secondary | ICD-10-CM | POA: Diagnosis not present

## 2019-07-23 DIAGNOSIS — B009 Herpesviral infection, unspecified: Secondary | ICD-10-CM | POA: Diagnosis present

## 2019-07-23 DIAGNOSIS — K219 Gastro-esophageal reflux disease without esophagitis: Secondary | ICD-10-CM | POA: Diagnosis not present

## 2019-07-23 DIAGNOSIS — G0491 Myelitis, unspecified: Secondary | ICD-10-CM | POA: Diagnosis present

## 2019-07-23 DIAGNOSIS — J449 Chronic obstructive pulmonary disease, unspecified: Secondary | ICD-10-CM | POA: Diagnosis not present

## 2019-07-23 DIAGNOSIS — Z87891 Personal history of nicotine dependence: Secondary | ICD-10-CM

## 2019-07-23 DIAGNOSIS — J9601 Acute respiratory failure with hypoxia: Secondary | ICD-10-CM | POA: Diagnosis not present

## 2019-07-23 DIAGNOSIS — M4316 Spondylolisthesis, lumbar region: Secondary | ICD-10-CM | POA: Diagnosis present

## 2019-07-23 DIAGNOSIS — R6520 Severe sepsis without septic shock: Secondary | ICD-10-CM | POA: Diagnosis present

## 2019-07-23 DIAGNOSIS — D638 Anemia in other chronic diseases classified elsewhere: Secondary | ICD-10-CM | POA: Diagnosis not present

## 2019-07-23 DIAGNOSIS — R509 Fever, unspecified: Secondary | ICD-10-CM | POA: Diagnosis not present

## 2019-07-23 DIAGNOSIS — D649 Anemia, unspecified: Secondary | ICD-10-CM | POA: Diagnosis not present

## 2019-07-23 DIAGNOSIS — R5081 Fever presenting with conditions classified elsewhere: Secondary | ICD-10-CM | POA: Diagnosis not present

## 2019-07-23 DIAGNOSIS — R531 Weakness: Secondary | ICD-10-CM | POA: Diagnosis not present

## 2019-07-23 DIAGNOSIS — R319 Hematuria, unspecified: Secondary | ICD-10-CM | POA: Diagnosis not present

## 2019-07-23 DIAGNOSIS — D72829 Elevated white blood cell count, unspecified: Secondary | ICD-10-CM | POA: Diagnosis present

## 2019-07-23 DIAGNOSIS — F419 Anxiety disorder, unspecified: Secondary | ICD-10-CM | POA: Diagnosis not present

## 2019-07-23 DIAGNOSIS — Z20822 Contact with and (suspected) exposure to covid-19: Secondary | ICD-10-CM

## 2019-07-23 DIAGNOSIS — Z743 Need for continuous supervision: Secondary | ICD-10-CM | POA: Diagnosis not present

## 2019-07-23 DIAGNOSIS — Z209 Contact with and (suspected) exposure to unspecified communicable disease: Secondary | ICD-10-CM | POA: Diagnosis not present

## 2019-07-23 DIAGNOSIS — R000 Tachycardia, unspecified: Secondary | ICD-10-CM | POA: Diagnosis not present

## 2019-07-23 DIAGNOSIS — N390 Urinary tract infection, site not specified: Secondary | ICD-10-CM

## 2019-07-23 DIAGNOSIS — G8929 Other chronic pain: Secondary | ICD-10-CM | POA: Diagnosis not present

## 2019-07-23 DIAGNOSIS — A419 Sepsis, unspecified organism: Secondary | ICD-10-CM | POA: Diagnosis not present

## 2019-07-23 DIAGNOSIS — E876 Hypokalemia: Secondary | ICD-10-CM | POA: Diagnosis not present

## 2019-07-23 DIAGNOSIS — Z885 Allergy status to narcotic agent status: Secondary | ICD-10-CM | POA: Diagnosis not present

## 2019-07-23 DIAGNOSIS — R062 Wheezing: Secondary | ICD-10-CM | POA: Diagnosis not present

## 2019-07-23 DIAGNOSIS — G629 Polyneuropathy, unspecified: Secondary | ICD-10-CM | POA: Diagnosis present

## 2019-07-23 DIAGNOSIS — T83511A Infection and inflammatory reaction due to indwelling urethral catheter, initial encounter: Secondary | ICD-10-CM

## 2019-07-23 DIAGNOSIS — R0602 Shortness of breath: Secondary | ICD-10-CM | POA: Diagnosis not present

## 2019-07-23 LAB — BASIC METABOLIC PANEL
Anion gap: 9 (ref 5–15)
BUN: 10 mg/dL (ref 8–23)
CO2: 23 mmol/L (ref 22–32)
Calcium: 7.7 mg/dL — ABNORMAL LOW (ref 8.9–10.3)
Chloride: 104 mmol/L (ref 98–111)
Creatinine, Ser: 0.84 mg/dL (ref 0.44–1.00)
GFR calc Af Amer: >60 mL/min (ref >60)
GFR calc non Af Amer: >60 mL/min (ref >60)
Glucose, Bld: 140 mg/dL — ABNORMAL HIGH (ref 70–99)
Potassium: 3.4 mmol/L — ABNORMAL LOW (ref 3.5–5.1)
Sodium: 136 mmol/L (ref 135–145)

## 2019-07-23 LAB — URINE CULTURE: Culture: NO GROWTH

## 2019-07-23 LAB — CBC WITH DIFFERENTIAL/PLATELET
Abs Immature Granulocytes: 0.17 10*3/uL — ABNORMAL HIGH (ref 0.00–0.07)
Basophils Absolute: 0.1 10*3/uL (ref 0.0–0.1)
Basophils Relative: 0 %
Eosinophils Absolute: 0.1 10*3/uL (ref 0.0–0.5)
Eosinophils Relative: 0 %
HCT: 41 % (ref 36.0–46.0)
Hemoglobin: 13.2 g/dL (ref 12.0–15.0)
Immature Granulocytes: 1 %
Lymphocytes Relative: 6 %
Lymphs Abs: 1.2 10*3/uL (ref 0.7–4.0)
MCH: 31.7 pg (ref 26.0–34.0)
MCHC: 32.2 g/dL (ref 30.0–36.0)
MCV: 98.6 fL (ref 80.0–100.0)
Monocytes Absolute: 0.9 10*3/uL (ref 0.1–1.0)
Monocytes Relative: 4 %
Neutro Abs: 19.2 10*3/uL — ABNORMAL HIGH (ref 1.7–7.7)
Neutrophils Relative %: 89 %
Platelets: 204 10*3/uL (ref 150–400)
RBC: 4.16 MIL/uL (ref 3.87–5.11)
RDW: 13.2 % (ref 11.5–15.5)
WBC: 21.6 10*3/uL — ABNORMAL HIGH (ref 4.0–10.5)
nRBC: 0 % (ref 0.0–0.2)

## 2019-07-23 LAB — PROTIME-INR
INR: 1.1 (ref 0.8–1.2)
Prothrombin Time: 13.7 seconds (ref 11.4–15.2)

## 2019-07-23 LAB — URINALYSIS, ROUTINE W REFLEX MICROSCOPIC
Bilirubin Urine: NEGATIVE
Glucose, UA: NEGATIVE mg/dL
Ketones, ur: NEGATIVE mg/dL
Nitrite: NEGATIVE
Protein, ur: 30 mg/dL — AB
RBC / HPF: >50 RBC/hpf — ABNORMAL HIGH (ref 0–5)
Specific Gravity, Urine: 1.011 (ref 1.005–1.030)
pH: 5 (ref 5.0–8.0)

## 2019-07-23 LAB — CBC
HCT: 34 % — ABNORMAL LOW (ref 36.0–46.0)
Hemoglobin: 10.8 g/dL — ABNORMAL LOW (ref 12.0–15.0)
MCH: 32.2 pg (ref 26.0–34.0)
MCHC: 31.8 g/dL (ref 30.0–36.0)
MCV: 101.5 fL — ABNORMAL HIGH (ref 80.0–100.0)
Platelets: 159 10*3/uL (ref 150–400)
RBC: 3.35 MIL/uL — ABNORMAL LOW (ref 3.87–5.11)
RDW: 13.5 % (ref 11.5–15.5)
WBC: 21.1 10*3/uL — ABNORMAL HIGH (ref 4.0–10.5)
nRBC: 0 % (ref 0.0–0.2)

## 2019-07-23 LAB — I-STAT CHEM 8, ED
BUN: 7 mg/dL — ABNORMAL LOW (ref 8–23)
Calcium, Ion: 1.19 mmol/L (ref 1.15–1.40)
Chloride: 97 mmol/L — ABNORMAL LOW (ref 98–111)
Creatinine, Ser: 0.9 mg/dL (ref 0.44–1.00)
Glucose, Bld: 143 mg/dL — ABNORMAL HIGH (ref 70–99)
HCT: 42 % (ref 36.0–46.0)
Hemoglobin: 14.3 g/dL (ref 12.0–15.0)
Potassium: 3.3 mmol/L — ABNORMAL LOW (ref 3.5–5.1)
Sodium: 138 mmol/L (ref 135–145)
TCO2: 26 mmol/L (ref 22–32)

## 2019-07-23 LAB — APTT: aPTT: 39 seconds — ABNORMAL HIGH (ref 24–36)

## 2019-07-23 LAB — COMPREHENSIVE METABOLIC PANEL
ALT: 13 U/L (ref 0–44)
AST: 19 U/L (ref 15–41)
Albumin: 3.9 g/dL (ref 3.5–5.0)
Alkaline Phosphatase: 108 U/L (ref 38–126)
Anion gap: 11 (ref 5–15)
BUN: 9 mg/dL (ref 8–23)
CO2: 25 mmol/L (ref 22–32)
Calcium: 9.1 mg/dL (ref 8.9–10.3)
Chloride: 100 mmol/L (ref 98–111)
Creatinine, Ser: 0.81 mg/dL (ref 0.44–1.00)
GFR calc Af Amer: >60 mL/min (ref >60)
GFR calc non Af Amer: >60 mL/min (ref >60)
Glucose, Bld: 141 mg/dL — ABNORMAL HIGH (ref 70–99)
Potassium: 3.4 mmol/L — ABNORMAL LOW (ref 3.5–5.1)
Sodium: 136 mmol/L (ref 135–145)
Total Bilirubin: 1.1 mg/dL (ref 0.3–1.2)
Total Protein: 7.5 g/dL (ref 6.5–8.1)

## 2019-07-23 LAB — MAGNESIUM: Magnesium: 1.5 mg/dL — ABNORMAL LOW (ref 1.7–2.4)

## 2019-07-23 LAB — MRSA PCR SCREENING: MRSA by PCR: NEGATIVE

## 2019-07-23 LAB — LACTIC ACID, PLASMA: Lactic Acid, Venous: 1.3 mmol/L (ref 0.5–1.9)

## 2019-07-23 LAB — SARS CORONAVIRUS 2 (TAT 6-24 HRS): SARS Coronavirus 2: NEGATIVE

## 2019-07-23 MED ORDER — IPRATROPIUM-ALBUTEROL 0.5-2.5 (3) MG/3ML IN SOLN
3.0000 mL | Freq: Two times a day (BID) | RESPIRATORY_TRACT | Status: DC
  Filled 2019-07-23: qty 3

## 2019-07-23 MED ORDER — SODIUM CHLORIDE 0.9 % IV BOLUS
500.0000 mL | Freq: Once | INTRAVENOUS | Status: AC
  Administered 2019-07-23: 500 mL via INTRAVENOUS

## 2019-07-23 MED ORDER — SODIUM CHLORIDE 0.9 % IV SOLN
2.0000 g | Freq: Once | INTRAVENOUS | Status: AC
  Administered 2019-07-23: 2 g via INTRAVENOUS
  Filled 2019-07-23: qty 2

## 2019-07-23 MED ORDER — ACETAMINOPHEN 325 MG PO TABS
650.0000 mg | ORAL_TABLET | Freq: Four times a day (QID) | ORAL | Status: DC | PRN
  Administered 2019-07-23 – 2019-07-24 (×2): 650 mg via ORAL
  Filled 2019-07-23 (×2): qty 2

## 2019-07-23 MED ORDER — CHLORHEXIDINE GLUCONATE CLOTH 2 % EX PADS
6.0000 | MEDICATED_PAD | Freq: Every day | CUTANEOUS | Status: DC
  Administered 2019-07-23 – 2019-07-28 (×5): 6 via TOPICAL

## 2019-07-23 MED ORDER — SODIUM CHLORIDE (PF) 0.9 % IJ SOLN
INTRAMUSCULAR | Status: AC
  Administered 2019-07-23: 06:00:00
  Filled 2019-07-23: qty 50

## 2019-07-23 MED ORDER — VANCOMYCIN HCL IN DEXTROSE 1-5 GM/200ML-% IV SOLN: 1000.0000 mg | Freq: Once | INTRAVENOUS | Status: DC

## 2019-07-23 MED ORDER — SODIUM CHLORIDE 0.9 % IV SOLN: INTRAVENOUS | Status: DC

## 2019-07-23 MED ORDER — POTASSIUM CHLORIDE CRYS ER 20 MEQ PO TBCR
40.0000 meq | EXTENDED_RELEASE_TABLET | Freq: Once | ORAL | Status: AC
  Administered 2019-07-23: 40 meq via ORAL
  Filled 2019-07-23: qty 2

## 2019-07-23 MED ORDER — METRONIDAZOLE IN NACL 5-0.79 MG/ML-% IV SOLN
500.0000 mg | Freq: Once | INTRAVENOUS | Status: AC
  Administered 2019-07-23: 500 mg via INTRAVENOUS
  Filled 2019-07-23: qty 100

## 2019-07-23 MED ORDER — POTASSIUM CHLORIDE IN NACL 20-0.9 MEQ/L-% IV SOLN
INTRAVENOUS | Status: DC
  Administered 2019-07-24 (×2): via INTRAVENOUS
  Filled 2019-07-23 (×4): qty 1000

## 2019-07-23 MED ORDER — SODIUM CHLORIDE 0.9 % IV BOLUS
1000.0000 mL | Freq: Once | INTRAVENOUS | Status: AC
  Administered 2019-07-23: 1000 mL via INTRAVENOUS

## 2019-07-23 MED ORDER — SODIUM CHLORIDE 0.9 % IV SOLN
2.0000 g | INTRAVENOUS | Status: DC
  Administered 2019-07-23 – 2019-07-24 (×2): 2 g via INTRAVENOUS
  Filled 2019-07-23 (×2): qty 20
  Filled 2019-07-23: qty 2

## 2019-07-23 MED ORDER — ACETAMINOPHEN 650 MG RE SUPP: 650.0000 mg | Freq: Four times a day (QID) | RECTAL | Status: DC | PRN

## 2019-07-23 MED ORDER — ALBUTEROL SULFATE HFA 108 (90 BASE) MCG/ACT IN AERS
8.0000 | INHALATION_SPRAY | Freq: Once | RESPIRATORY_TRACT | Status: AC
  Administered 2019-07-23: 8 via RESPIRATORY_TRACT
  Filled 2019-07-23: qty 6.7

## 2019-07-23 MED ORDER — ALBUTEROL SULFATE (2.5 MG/3ML) 0.083% IN NEBU: 2.5000 mg | INHALATION_SOLUTION | RESPIRATORY_TRACT | Status: DC | PRN

## 2019-07-23 MED ORDER — LEVOFLOXACIN IN D5W 500 MG/100ML IV SOLN
500.0000 mg | Freq: Every day | INTRAVENOUS | Status: DC
  Administered 2019-07-23: 500 mg via INTRAVENOUS
  Filled 2019-07-23: qty 100

## 2019-07-23 MED ORDER — IOHEXOL 350 MG/ML SOLN
100.0000 mL | Freq: Once | INTRAVENOUS | Status: AC | PRN
  Administered 2019-07-23: 100 mL via INTRAVENOUS

## 2019-07-23 MED ORDER — ORAL CARE MOUTH RINSE
15.0000 mL | Freq: Two times a day (BID) | OROMUCOSAL | Status: DC
  Administered 2019-07-23 – 2019-07-28 (×7): 15 mL via OROMUCOSAL

## 2019-07-23 MED ORDER — VANCOMYCIN HCL 10 G IV SOLR
1250.0000 mg | Freq: Once | INTRAVENOUS | Status: AC
  Administered 2019-07-23: 1250 mg via INTRAVENOUS
  Filled 2019-07-23: qty 1250

## 2019-07-23 MED ORDER — ENOXAPARIN SODIUM 40 MG/0.4ML ~~LOC~~ SOLN: 40.0000 mg | SUBCUTANEOUS | Status: DC

## 2019-07-23 MED ORDER — IPRATROPIUM-ALBUTEROL 0.5-2.5 (3) MG/3ML IN SOLN: 3.0000 mL | Freq: Four times a day (QID) | RESPIRATORY_TRACT | Status: DC

## 2019-07-23 NOTE — ED Notes (Signed)
Obtained one set of blood cultures prior to starting antibiotics due to patient being a difficult stick.

## 2019-07-23 NOTE — Progress Notes (Signed)
A consult was received from an ED physician for Vancomycin, Aztreoanm per pharmacy dosing.  The patient's profile has been reviewed for ht/wt/allergies/indication/available labs.   A one time order has been placed for Vancomycin 1.25gm iv x1, then Aztreonam 2gm iv x1.  Further antibiotics/pharmacy consults should be ordered by admitting physician if indicated.                       Thank you, Nani Skillern Crowford 07/23/2019  12:52 AM

## 2019-07-23 NOTE — ED Notes (Signed)
Dr. Marlowe Sax paged regarding documented BP. Ordered IV fluid bolus infusing currently.

## 2019-07-23 NOTE — ED Notes (Signed)
Patient transported to CT 

## 2019-07-23 NOTE — ED Provider Notes (Signed)
[facility] DEPT Provider Note   CSN: [id number] Arrival date & time: 07/23/19  0018     History   Chief Complaint Chief Complaint  Patient presents with  . Shortness of Breath  . Fever    HPI Katelyn Lamb is a 73 y.o. female.     The history is provided by the EMS personnel. The history is limited by the condition of the patient.  Shortness of Breath Severity:  Moderate Onset quality:  Gradual Timing:  Constant Progression:  Worsening Chronicity:  New Context: not activity and not fumes   Relieved by:  Nothing Worsened by:  Nothing Associated symptoms: fever   Associated symptoms: no chest pain and no rash   Associated symptoms comment:  Fatigue and falls was reportedly diagnosed with a UTI Risk factors: no recent surgery   Fever Associated symptoms: no chest pain and no rash     Past Medical History:  Diagnosis Date  . Anxiety   . Back pain   . Gait abnormality 11/14/2016  . Memory difficulty 03/02/2019  . Myelitis due to herpes simplex [facility])     Patient Active Problem List   Diagnosis Date Noted  . Generalized anxiety disorder 06/17/2019  . Menopausal sweats 06/17/2019  . Memory difficulty 03/02/2019  . Degenerative spondylolisthesis 09/02/2018  . Delirium 03/26/2018  . SIRS (systemic inflammatory response syndrome) ([place]) 03/26/2018  . 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 Degeneration of lumbar intervertebral disc 11/08/2017  . Lumbar radiculopathy 11/06/2017  . Chronic neck pain 09/12/2017  . Chronic low back pain 09/06/2017  . Chronic pain syndrome 09/06/2017  . Dilated pancreatic duct 01/24/2017  . DVT, lower extremity, distal, chronic ([place]) 01/22/2017  . Elevated serum GGT level 01/22/2017  . Medication monitoring encounter 01/08/2017  . Gait abnormality 11/14/2016  . Hypotension due to drugs   . Urinary retention   . Anxiety about health   . Reactive depression   . Ataxia   .  Abdominal spasms   . Constipation due to pain medication   . Acute lower UTI   . Dysuria   . Acute deep vein thrombosis (DVT) of popliteal vein of left lower extremity ([place])   . Incomplete paraplegia ([place])   . Acute blood loss anemia   . Neurogenic bladder   . Neuropathic pain   . Muscle spasm   . Gastroesophageal reflux disease   . Slow transit constipation   . Thrombocytopenia ([place]) 10/03/2016  . Abnormal MRI, spinal cord   . Encephalomyelitis   . Numbness   . Intractable back pain 09/20/2016  . Numbness of left lower extremity 09/20/2016  . Hyponatremia 09/20/2016  . Herpes zoster without complication 0000000  . Spondylosis of cervical region without myelopathy or radiculopathy 06/16/2015    Past Surgical History:  Procedure Laterality Date  . ABDOMINAL HYSTERECTOMY    . BLADDER REPAIR    . CESAREAN SECTION    . IR KYPHO LUMBAR INC FX REDUCE BONE BX UNI/BIL CANNULATION INC/IMAGING  04/11/2018  . TUBAL LIGATION       OB History   No obstetric history on file.      Home Medications    Prior to Admission medications   Medication Sig Start Date End Date Taking? Authorizing Provider  acyclovir (ZOVIRAX) 400 MG tablet TAKE 1 TABLET BY MOUTH TWICE A DAY 07/13/19   [name], MD  Biotin 10000 MCG TABS Take 1 tablet by mouth daily.  [provider]  Calcium Carb-Cholecalciferol (CALCIUM+D3 PO) Take 1 tablet by mouth 2 (two) times daily. 1200 mg calcium +D3     [provider]  conjugated estrogens (PREMARIN) vaginal cream Place 1 Applicatorful vaginally daily as needed (itching).     [provider]  diazepam (VALIUM) 5 MG tablet Take 5 mg by mouth 2 (two) times daily.    [provider]  diclofenac sodium (VOLTAREN) 1 % GEL APPLY 2 GRAM TO THE AFFECTED AREA(S) 4 TIMES PER DAY 02/10/18   [provider]  diphenoxylate-atropine (LOMOTIL) 2.5-0.025 MG tablet  02/27/19   [provider]  doxycycline (VIBRA-TABS) 100  MG tablet Take 100 mg by mouth 2 (two) times daily. 04/02/19   [provider]  DULoxetine (CYMBALTA) 30 MG capsule Take 1 capsule (30 mg total) by mouth daily. 03/02/19   [name], MD  DULoxetine (CYMBALTA) 60 MG capsule Take 1 capsule (60 mg total) by mouth at bedtime. 03/02/19   [name], MD  fluconazole (DIFLUCAN) 150 MG tablet  06/16/19   [provider]  HYDROcodone-acetaminophen (NORCO) 10-325 MG tablet Take 1 tablet by mouth 3 (three) times daily as needed for severe pain.  03/07/18   [provider]  Menthol, Topical Analgesic, (STOPAIN EX) Apply 1 application topically See admin instructions. Spray topically at bedtime as needed for pain    [provider]  methocarbamol (ROBAXIN) 750 MG tablet TAKE 1 TO 2 TABLETS BY MOUTH 3 TIMES A DAY AS NEEDED 04/09/19   [provider]  omega-3 acid ethyl esters (LOVAZA) 1 g capsule Take 1 capsule (1 g total) by mouth 2 (two) times daily. Patient taking differently: Take 1 g by mouth daily.  10/26/16   Angiulli, Lavon Paganini, PA-C  pantoprazole (PROTONIX) 40 MG tablet Take 1 tablet (40 mg total) by mouth daily. 10/26/16   Angiulli, Lavon Paganini, PA-C  polyethylene glycol (MIRALAX / GLYCOLAX) packet Take 17 g by mouth daily. Patient taking differently: Take 17 g by mouth daily as needed (constipation).  10/26/16   Angiulli, Lavon Paganini, PA-C  pregabalin (LYRICA) 50 MG capsule Take 2 capsules (100 mg total) by mouth at bedtime. TAKE 1 CAPSULE BY MOUTH IN THE MORNING AND TAKE 2 CAPSULES BY MOUTH IN THE EVENING. 03/02/19   [name], MD  sulfamethoxazole-trimethoprim (BACTRIM DS) 800-160 MG tablet TAKE 1 TABLET BY MOUTH TWICE A DAY FOR 7 DAYS 06/05/19   [provider]  terconazole (TERAZOL 7) 0.4 % vaginal cream  06/16/19   [provider]  triamcinolone cream (KENALOG) 0.1 % APPLY THIN COAT TO AFFECTED AREA TWICE A DAY 06/03/19   [provider]  zaleplon (SONATA) 10 MG capsule TAKE  1 CAPSULE BY MOUTH EVERY DAY AT BEDTIME 05/12/19   [provider]    Family History Family History  Problem Relation Age of Onset  . Hypertension Mother     Social History Social History   Tobacco Use  . Smoking status: Former Research scientist (life sciences)  . Smokeless tobacco: Never Used  Substance Use Topics  . Alcohol use: No  . Drug use: No     Allergies   Demerol [meperidine], Percocet [oxycodone-acetaminophen], Amoxicillin-pot clavulanate, and Penicillins   Review of Systems Review of Systems  Unable to perform ROS: Mental status change  Constitutional: Positive for fever.  Respiratory: Positive for shortness of breath.   Cardiovascular: Negative for chest pain.  Skin: Negative for rash.     Physical Exam Updated Vital Signs  BP 106/72 (BP Location: Right Arm)   Pulse (!) 110   Temp (!) 101.2 F (38.4 C) (Rectal)   Resp 20   Ht 5\' 5"  (1.651 m)   Wt 68 kg   SpO2 96%   BMI 24.96 kg/m   Physical Exam Vitals signs and nursing note reviewed.  Constitutional:      General: She is not in acute distress.    Appearance: She is normal weight.  HENT:     Head: Normocephalic and atraumatic.     Nose: Nose normal.  Eyes:     Conjunctiva/sclera: Conjunctivae normal.     Pupils: Pupils are equal, round, and reactive to light.  Neck:     Musculoskeletal: Normal range of motion and neck supple. No neck rigidity.  Cardiovascular:     Rate and Rhythm: Regular rhythm. Tachycardia present.     Pulses: Normal pulses.     Heart sounds: Normal heart sounds.  Pulmonary:     Effort: Pulmonary effort is normal.     Breath sounds: Wheezing present.  Abdominal:     General: Abdomen is flat. Bowel sounds are normal.     Tenderness: There is no abdominal tenderness.  Musculoskeletal: Normal range of motion.  Lymphadenopathy:     Cervical: No cervical adenopathy.  Skin:    General: Skin is warm and dry.     Capillary Refill: Capillary refill takes less than 2 seconds.     Findings:  No erythema.  Neurological:     General: No focal deficit present.     Mental Status: She is alert.  Psychiatric:        Mood and Affect: Mood normal.      ED Treatments / Results  Labs (all labs ordered are listed, but only abnormal results are displayed) Results for orders placed or performed during the hospital encounter of 07/23/19  CBC with Differential  Result Value Ref Range   WBC 21.6 (H) 4.0 - 10.5 K/uL   RBC 4.16 3.87 - 5.11 MIL/uL   Hemoglobin 13.2 12.0 - 15.0 g/dL   HCT 41.0 36.0 - 46.0 %   MCV 98.6 80.0 - 100.0 fL   MCH 31.7 26.0 - 34.0 pg   MCHC 32.2 30.0 - 36.0 g/dL   RDW 13.2 11.5 - 15.5 %   Platelets 204 150 - 400 K/uL   nRBC 0.0 0.0 - 0.2 %   Neutrophils Relative % 89 %   Neutro Abs 19.2 (H) 1.7 - 7.7 K/uL   Lymphocytes Relative 6 %   Lymphs Abs 1.2 0.7 - 4.0 K/uL   Monocytes Relative 4 %   Monocytes Absolute 0.9 0.1 - 1.0 K/uL   Eosinophils Relative 0 %   Eosinophils Absolute 0.1 0.0 - 0.5 K/uL   Basophils Relative 0 %   Basophils Absolute 0.1 0.0 - 0.1 K/uL   Immature Granulocytes 1 %   Abs Immature Granulocytes 0.17 (H) 0.00 - 0.07 K/uL  I-stat chem 8, ED (not at [facility] or [facility])  Result Value Ref Range   Sodium 138 135 - 145 mmol/L   Potassium 3.3 (L) 3.5 - 5.1 mmol/L   Chloride 97 (L) 98 - 111 mmol/L   BUN 7 (L) 8 - 23 mg/dL   Creatinine, Ser 0.90 0.44 - 1.00 mg/dL   Glucose, Bld 143 (H) 70 - 99 mg/dL   Calcium, Ion 1.19 1.15 - 1.40 mmol/L   TCO2 26 22 - 32 mmol/L   Hemoglobin 14.3 12.0 - 15.0 g/dL  HCT 42.0 36.0 - 46.0 %   No results found.  EKG EKG Interpretation  Date/Time:  Thursday July 23 2019 00:41:57 EST Ventricular Rate:  112 PR Interval:    QRS Duration: 87 QT Interval:  332 QTC Calculation: 454 R Axis:   -59 Text Interpretation: Sinus tachycardia Left axis deviation Borderline low voltage, extremity leads Baseline wander in lead(s) V1 Confirmed by Randal Buba, Kailena Lubas ([id number]) on 07/23/2019 1:26:38 AM   Radiology No  results found.  Procedures Procedures (including critical care time)  Medications Ordered in ED Medications  aztreonam (AZACTAM) 2 g in sodium chloride 0.9 % 100 mL IVPB (has no administration in time range)  metroNIDAZOLE (FLAGYL) IVPB 500 mg (500 mg Intravenous New Bag/Given 07/23/19 0119)  vancomycin (VANCOCIN) 1,250 mg in sodium chloride 0.9 % 250 mL IVPB (1,250 mg Intravenous New Bag/Given 07/23/19 0122)  sodium chloride 0.9 % bolus 500 mL (has no administration in time range)  albuterol (VENTOLIN HFA) 108 (90 Base) MCG/ACT inhaler 8 puff (8 puffs Inhalation Given 07/23/19 0116)     MDM Reviewed: previous chart, nursing note and vitals Interpretation: labs, ECG and x-ray (elevated white count normal creatinine.  ) Total time providing critical care: 30-74 minutes (sepsis initiated ). This excludes time spent performing separately reportable procedures and services. Consults: admitting MD  CRITICAL CARE Performed by: Hailyn Zarr K Taina Landry-Rasch Total critical care time: 60 minutes Critical care time was exclusive of separately billable procedures and treating other patients. Critical care was necessary to treat or prevent imminent or life-threatening deterioration. Critical care was time spent personally by me on the following activities: development of treatment plan with patient and/or surrogate as well as nursing, discussions with consultants, evaluation of patient's response to treatment, examination of patient, obtaining history from patient or surrogate, ordering and performing treatments and interventions, ordering and review of laboratory studies, ordering and review of radiographic studies, pulse oximetry and re-evaluation of patient's condition.  30 cc/kg bolus withheld as I am concerned due to the constellation of symptoms that the patient has covid   Final Clinical Impressions(s) / ED Diagnoses   Final diagnoses:  Sepsis, due to unspecified organism, unspecified whether  acute organ dysfunction present [facility])    ED Discharge Orders    None       Keyera Hattabaugh, MD 07/23/19 [id number]

## 2019-07-23 NOTE — ED Triage Notes (Signed)
Patient coming from home with complaints of not feeling well for the past few days. Patient reports fever, SOB, and weakness. Currently on antibiotics for a UTI. Patient almost fell when ambulating to restroom, but husband helped to lower her to ground. No injuries noted. Patient had wheezing upon assessment and was 87% on RA. SpO2 96% on 4 L of O2 via [place]. Temp with EMS was 101 with no Tylenol given.

## 2019-07-23 NOTE — ED Notes (Signed)
Husband Keylan Mecham (spouse) [phone] would like to be updated on covid status so that he may visit.

## 2019-07-23 NOTE — H&P (Signed)
History and Physical    Katelyn Lamb [id number] DOB: 1945/12/07 DOA: 07/23/2019  PCP: [name], MD Patient coming from: Home  Chief Complaint: Dysuria  HPI: Katelyn Lamb is a 73 y.o. female with medical history significant of anxiety, back pain, gait abnormality, memory difficulty, HSV myelitis presenting with a chief complaint of dysuria.  States she was recently diagnosed with a UTI and prescribed an antibiotic.  She has taken 4 doses so far.  Denies fevers or chills.  Denies chest pain, shortness of breath, or cough.  Denies nausea, vomiting, abdominal pain, or diarrhea.  Denies any recent sick contacts.  States she had a blood clot in her left leg a few years ago.  ED Course: Temperature 101.2 F, slightly tachycardic and tachypneic.  Blood pressure soft.  Oxygen saturation 87% on room air, improved with 4 L supplemental oxygen via nasal cannula.  Noted to be wheezing.  WBC count 21.6 with left shift.  Lactic acid normal.  UA with large amount of leukocytes, greater than 50 RBCs, 11-20 WBCs, and many bacteria.  Urine culture pending.  Blood culture x2 pending.  Chest x-ray not suggestive of pneumonia.  Patient received albuterol, aztreonam, metronidazole, vancomycin, and a 500 cc normal saline bolus.  Review of Systems:  All systems reviewed and apart from history of presenting illness, are negative.  Past Medical History:  Diagnosis Date   Anxiety    Back pain    Gait abnormality 11/14/2016   Memory difficulty 03/02/2019   Myelitis due to herpes simplex [facility])     Past Surgical History:  Procedure Laterality Date   ABDOMINAL HYSTERECTOMY     BLADDER REPAIR     CESAREAN SECTION     IR KYPHO LUMBAR INC FX REDUCE BONE BX UNI/BIL CANNULATION INC/IMAGING  04/11/2018   TUBAL LIGATION       reports that she has quit smoking. She has never used smokeless tobacco. She reports that she does not drink alcohol or use drugs.  Allergies  Allergen Reactions   Demerol  [Meperidine] Other (See Comments)    Hallucinations   Percocet [Oxycodone-Acetaminophen] Itching   Amoxicillin-Pot Clavulanate Diarrhea    Severe pain, headache, intestinal infection   Penicillins Itching and Rash    Has patient had a PCN reaction causing immediate rash, facial/tongue/throat swelling, SOB or lightheadedness with hypotension:  NO Has patient had a PCN reaction causing severe rash involving mucus membranes or skin necrosis: No Has patient had a PCN reaction that required hospitalization: No Has patient had a PCN reaction occurring within the last 10 years: Yes If all of the above answers are "NO", then may proceed with Cephalosporin use.    Family History  Problem Relation Age of Onset   Hypertension Mother     Prior to Admission medications   Medication Sig Start Date End Date Taking? Authorizing Provider  acyclovir (ZOVIRAX) 400 MG tablet TAKE 1 TABLET BY MOUTH TWICE A DAY 07/13/19   [name], MD  Biotin 10000 MCG TABS Take 1 tablet by mouth daily.    [provider]  Calcium Carb-Cholecalciferol (CALCIUM+D3 PO) Take 1 tablet by mouth 2 (two) times daily. 1200 mg calcium +D3     [provider]  conjugated estrogens (PREMARIN) vaginal cream Place 1 Applicatorful vaginally daily as needed (itching).     [provider]  diazepam (VALIUM) 5 MG tablet Take 5 mg by mouth 2 (two) times daily.    [provider]  diclofenac sodium (VOLTAREN)  1 % GEL APPLY 2 GRAM TO THE AFFECTED AREA(S) 4 TIMES PER DAY 02/10/18   [provider]  diphenoxylate-atropine (LOMOTIL) 2.5-0.025 MG tablet  02/27/19   [provider]  doxycycline (VIBRA-TABS) 100 MG tablet Take 100 mg by mouth 2 (two) times daily. 04/02/19   [provider]  DULoxetine (CYMBALTA) 30 MG capsule Take 1 capsule (30 mg total) by mouth daily. 03/02/19   [name], MD  DULoxetine (CYMBALTA) 60 MG capsule Take 1 capsule (60 mg total) by mouth at  bedtime. 03/02/19   [name], MD  fluconazole (DIFLUCAN) 150 MG tablet  06/16/19   [provider]  HYDROcodone-acetaminophen (NORCO) 10-325 MG tablet Take 1 tablet by mouth 3 (three) times daily as needed for severe pain.  03/07/18   [provider]  Menthol, Topical Analgesic, (STOPAIN EX) Apply 1 application topically See admin instructions. Spray topically at bedtime as needed for pain    [provider]  methocarbamol (ROBAXIN) 750 MG tablet TAKE 1 TO 2 TABLETS BY MOUTH 3 TIMES A DAY AS NEEDED 04/09/19   [provider]  omega-3 acid ethyl esters (LOVAZA) 1 g capsule Take 1 capsule (1 g total) by mouth 2 (two) times daily. Patient taking differently: Take 1 g by mouth daily.  10/26/16   Angiulli, Lavon Paganini, PA-C  pantoprazole (PROTONIX) 40 MG tablet Take 1 tablet (40 mg total) by mouth daily. 10/26/16   Angiulli, Lavon Paganini, PA-C  polyethylene glycol (MIRALAX / GLYCOLAX) packet Take 17 g by mouth daily. Patient taking differently: Take 17 g by mouth daily as needed (constipation).  10/26/16   Angiulli, Lavon Paganini, PA-C  pregabalin (LYRICA) 50 MG capsule Take 2 capsules (100 mg total) by mouth at bedtime. TAKE 1 CAPSULE BY MOUTH IN THE MORNING AND TAKE 2 CAPSULES BY MOUTH IN THE EVENING. 03/02/19   [name], MD  sulfamethoxazole-trimethoprim (BACTRIM DS) 800-160 MG tablet TAKE 1 TABLET BY MOUTH TWICE A DAY FOR 7 DAYS 06/05/19   [provider]  terconazole (TERAZOL 7) 0.4 % vaginal cream  06/16/19   [provider]  triamcinolone cream (KENALOG) 0.1 % APPLY THIN COAT TO AFFECTED AREA TWICE A DAY 06/03/19   [provider]  zaleplon (SONATA) 10 MG capsule TAKE 1 CAPSULE BY MOUTH EVERY DAY AT BEDTIME 05/12/19   [provider]    Physical Exam: Vitals:   07/23/19 0521 07/23/19 0530 07/23/19 0601 07/23/19 0649  BP: (!) 86/58 (!) 88/60 (!) 84/58 111/87  Pulse: (!) 113 92 94 92  Resp: (!) 26 (!) 25 (!) 24 18  Temp:        TempSrc:      SpO2: 95% 94% 98% 95%  Weight:      Height:        Physical Exam  Constitutional: She is oriented to person, place, and time. She appears well-developed and well-nourished. No distress.  HENT:  Head: Normocephalic.  Eyes: Right eye exhibits no discharge. Left eye exhibits no discharge.  Neck: Neck supple.  Cardiovascular: Normal rate, regular rhythm and intact distal pulses.  Pulmonary/Chest: Effort normal and breath sounds normal. She has no wheezes. She has no rales.  On 4 L supplemental oxygen via nasal cannula Speaking clearly in full sentences  Abdominal: Soft. Bowel sounds are normal. She exhibits no distension. There is no abdominal tenderness. There is no guarding.  Musculoskeletal:        General: No edema.  Neurological: She is alert and  oriented to person, place, and time.  Skin: Skin is warm and dry. She is not diaphoretic.     Labs on Admission: I have personally reviewed following labs and imaging studies  CBC: Recent Labs  Lab 07/23/19 0059 07/23/19 0110 07/23/19 0607  WBC 21.6*  --  21.1*  NEUTROABS 19.2*  --   --   HGB 13.2 14.3 10.8*  HCT 41.0 42.0 34.0*  MCV 98.6  --  101.5*  PLT 204  --  [id number]   Basic Metabolic Panel: Recent Labs  Lab 07/23/19 0059 07/23/19 0110 07/23/19 0607  NA 136 138 136  K 3.4* 3.3* 3.4*  CL 100 97* 104  CO2 25  --  23  GLUCOSE 141* 143* 140*  BUN 9 7* 10  CREATININE 0.81 0.90 0.84  CALCIUM 9.1  --  7.7*  MG 1.5*  --   --    GFR: Estimated Creatinine Clearance: 55.3 mL/min (by C-G formula based on SCr of 0.84 mg/dL). Liver Function Tests: Recent Labs  Lab 07/23/19 0059  AST 19  ALT 13  ALKPHOS 108  BILITOT 1.1  PROT 7.5  ALBUMIN 3.9   No results for input(s): LIPASE, AMYLASE in the last 168 hours. No results for input(s): AMMONIA in the last 168 hours. Coagulation Profile: Recent Labs  Lab 07/23/19 0059  INR 1.1   Cardiac Enzymes: No results for input(s): CKTOTAL, CKMB, CKMBINDEX,  TROPONINI in the last 168 hours. BNP (last 3 results) No results for input(s): PROBNP in the last 8760 hours. HbA1C: No results for input(s): HGBA1C in the last 72 hours. CBG: No results for input(s): GLUCAP in the last 168 hours. Lipid Profile: No results for input(s): CHOL, HDL, LDLCALC, TRIG, CHOLHDL, LDLDIRECT in the last 72 hours. Thyroid Function Tests: No results for input(s): TSH, T4TOTAL, FREET4, T3FREE, THYROIDAB in the last 72 hours. Anemia Panel: No results for input(s): VITAMINB12, FOLATE, FERRITIN, TIBC, IRON, RETICCTPCT in the last 72 hours. Urine analysis:    Component Value Date/Time   COLORURINE AMBER (A) 07/23/2019 0059   APPEARANCEUR CLOUDY (A) 07/23/2019 0059   LABSPEC 1.011 07/23/2019 0059   PHURINE 5.0 07/23/2019 0059   GLUCOSEU NEGATIVE 07/23/2019 0059   HGBUR LARGE (A) 07/23/2019 0059   BILIRUBINUR NEGATIVE 07/23/2019 [place] 07/23/2019 0059   PROTEINUR 30 (A) 07/23/2019 0059   NITRITE NEGATIVE 07/23/2019 0059   LEUKOCYTESUR LARGE (A) 07/23/2019 0059    Radiological Exams on Admission: Ct Angio Chest Pe W Or Wo Contrast  Result Date: 07/23/2019 CLINICAL DATA:  Shortness of breath. EXAM: CT ANGIOGRAPHY CHEST WITH CONTRAST TECHNIQUE: Multidetector CT imaging of the chest was performed using the standard protocol during bolus administration of intravenous contrast. Multiplanar CT image reconstructions and MIPs were obtained to evaluate the vascular anatomy. CONTRAST:  191mL OMNIPAQUE IOHEXOL 350 MG/ML SOLN COMPARISON:  May 21, 2014. FINDINGS: Cardiovascular: Satisfactory opacification of the pulmonary arteries to the segmental level. No evidence of pulmonary embolism. Normal heart size. No pericardial effusion. Mediastinum/Nodes: No enlarged mediastinal, hilar, or axillary lymph nodes. Thyroid gland, trachea, and esophagus demonstrate no significant findings. Lungs/Pleura: No pneumothorax or pleural effusion is noted. Minimal bilateral  posterior basilar subsegmental atelectasis is noted. Upper Abdomen: No acute abnormality. Musculoskeletal: No chest wall abnormality. No acute or significant osseous findings. Review of the MIP images confirms the above findings. IMPRESSION: No definite evidence of pulmonary embolus. Minimal bilateral posterior basilar subsegmental atelectasis. Electronically Signed   By: Marijo Conception M.D.   On:  07/23/2019 07:13   Dg Chest Portable 1 View  Result Date: 07/23/2019 CLINICAL DATA:  73 year old female with fever and shortness of breath. EXAM: PORTABLE CHEST 1 VIEW COMPARISON:  Chest radiographs 03/26/2018 and earlier. FINDINGS: Portable AP semi upright view at 0131 hours. Mildly lower lung volumes. Mediastinal contours remain normal. Visualized tracheal air column is within normal limits. Allowing for portable technique the lungs are clear. No acute osseous abnormality identified. IMPRESSION: Negative portable chest. Electronically Signed   By: Genevie Ann M.D.   On: 07/23/2019 01:39    EKG: Independently reviewed.  Sinus tachycardia, heart rate 112.  Baseline wander in V1.  Assessment/Plan Principal Problem:   UTI (urinary tract infection) Active Problems:   Sepsis ([place])   Hematuria   Acute respiratory failure with hypoxia ([facility])   Hypokalemia   Severe sepsis secondary to UTI Febrile, tachycardic, tachypneic.  Did become hypotensive in the ED, blood pressure now improved after 1.5 L IV fluid. WBC count 21.6 with left shift.  Lactic acid normal.  UA with large amount of leukocytes, 11-20 WBCs, and many bacteria. -Continue IV fluid hydration -Levaquin (penicillin allergy) -Urine culture pending -Blood culture x2 pending -Continue to monitor WBC count  Hematuria Urine amber colored and UA with large amount of RBCs.  Hemoglobin 14.3 on admission, did drop to 10.8 this morning after IV fluid resuscitation. -CT renal stone study -Continue to monitor hemoglobin and hematocrit  Acute hypoxic  respiratory failure secondary to suspected reactive airway disease Oxygen saturation 87% on room air, improved with 4 L supplemental oxygen via nasal cannula.  Per ED documentation, patient was wheezing on arrival and received albuterol.  No documented history of asthma or COPD.  CT angiogram negative for PE or pneumonia.  Showing minimal bilateral posterior basilar subsegmental atelectasis.  Currently not wheezing. -DuoNebs every 6 hours -Albuterol nebulizer as needed -Incentive spirometry  Mild hypokalemia Potassium 3.4. -Replete potassium.  Check magnesium level and replete if low.  Continue to monitor BMP.  Pharmacy med rec pending.  DVT prophylaxis: SCDs given hematuria Code Status: Patient wishes to be full code. Family Communication: No family available. Disposition Plan: Anticipate discharge after clinical improvement. Consults called: None Admission status: It is my clinical opinion that admission to INPATIENT is reasonable and necessary in this 73 y.o. female  presenting with severe sepsis secondary to UTI, hematuria, and acute hypoxic respiratory failure.  High risk of decompensation.  Given the aforementioned, the predictability of an adverse outcome is felt to be significant. I expect that the patient will require at least 2 midnights in the hospital to treat this condition.   The medical decision making on this patient was of high complexity and the patient is at high risk for clinical deterioration, therefore this is a level 3 visit.  Shela Leff MD [facility] Pager [phone]  If 7PM-7AM, please contact night-coverage [email or web address] Password [facility]  07/23/2019, 7:53 AM

## 2019-07-23 NOTE — Progress Notes (Signed)
Pharmacy Antibiotic Note  Katelyn Lamb is a 73 y.o. female admitted on 07/23/2019 with UTI.  Pharmacy has been consulted for levofloxacin dosing.  Plan: Levofloxacin 500mg  iv q24hr  Height: 5\' 5"  (165.1 cm) Weight: 150 lb (68 kg) IBW/kg (Calculated) : 57  Temp (24hrs), Avg:101.2 F (38.4 C), Min:101.2 F (38.4 C), Max:101.2 F (38.4 C)  Recent Labs  Lab 07/23/19 0059 07/23/19 0110  WBC 21.6*  --   CREATININE 0.81 0.90  LATICACIDVEN 1.3  --     Estimated Creatinine Clearance: 51.6 mL/min (by C-G formula based on SCr of 0.9 mg/dL).    Allergies  Allergen Reactions  . Demerol [Meperidine] Other (See Comments)    Hallucinations  . Percocet [Oxycodone-Acetaminophen] Itching  . Amoxicillin-Pot Clavulanate Diarrhea    Severe pain, headache, intestinal infection  . Penicillins Itching and Rash    Has patient had a PCN reaction causing immediate rash, facial/tongue/throat swelling, SOB or lightheadedness with hypotension:  NO Has patient had a PCN reaction causing severe rash involving mucus membranes or skin necrosis: No Has patient had a PCN reaction that required hospitalization: No Has patient had a PCN reaction occurring within the last 10 years: Yes If all of the above answers are "NO", then may proceed with Cephalosporin use.    Antimicrobials this admission: Vancomycin 07/23/2019 x1 Aztreonam 07/23/2019 x1 Levofloxacin 07/23/2019 >>   Dose adjustments this admission: -  Microbiology results: -  Thank you for allowing pharmacy to be a part of this patient's care.  Nani Skillern [place] 07/23/2019 4:39 AM

## 2019-07-23 NOTE — ED Notes (Signed)
Patient incontinent of stool at this time; bed, gown and linen changed. Patient provided with cup of ice and call bell within patient reach.

## 2019-07-24 LAB — BASIC METABOLIC PANEL
Anion gap: 8 (ref 5–15)
BUN: 8 mg/dL (ref 8–23)
CO2: 23 mmol/L (ref 22–32)
Calcium: 8.7 mg/dL — ABNORMAL LOW (ref 8.9–10.3)
Chloride: 111 mmol/L (ref 98–111)
Creatinine, Ser: 0.73 mg/dL (ref 0.44–1.00)
GFR calc Af Amer: >60 mL/min (ref >60)
GFR calc non Af Amer: >60 mL/min (ref >60)
Glucose, Bld: 97 mg/dL (ref 70–99)
Potassium: 4.3 mmol/L (ref 3.5–5.1)
Sodium: 142 mmol/L (ref 135–145)

## 2019-07-24 LAB — CBC
HCT: 36.8 % (ref 36.0–46.0)
Hemoglobin: 11.3 g/dL — ABNORMAL LOW (ref 12.0–15.0)
MCH: 31.5 pg (ref 26.0–34.0)
MCHC: 30.7 g/dL (ref 30.0–36.0)
MCV: 102.5 fL — ABNORMAL HIGH (ref 80.0–100.0)
Platelets: 154 10*3/uL (ref 150–400)
RBC: 3.59 MIL/uL — ABNORMAL LOW (ref 3.87–5.11)
RDW: 13.5 % (ref 11.5–15.5)
WBC: 9.4 10*3/uL (ref 4.0–10.5)
nRBC: 0 % (ref 0.0–0.2)

## 2019-07-24 MED ORDER — POLYETHYLENE GLYCOL 3350 17 G PO PACK
17.0000 g | PACK | Freq: Every day | ORAL | Status: DC
  Administered 2019-07-25 – 2019-07-26 (×2): 17 g via ORAL
  Filled 2019-07-24 (×5): qty 1

## 2019-07-24 MED ORDER — DULOXETINE HCL 30 MG PO CPEP
60.0000 mg | ORAL_CAPSULE | Freq: Every day | ORAL | Status: DC
  Administered 2019-07-24 – 2019-07-27 (×4): 60 mg via ORAL
  Filled 2019-07-24 (×4): qty 2

## 2019-07-24 MED ORDER — HYDROCODONE-ACETAMINOPHEN 10-325 MG PO TABS
1.0000 | ORAL_TABLET | Freq: Three times a day (TID) | ORAL | Status: DC | PRN
  Administered 2019-07-25 – 2019-07-27 (×2): 1 via ORAL
  Filled 2019-07-24 (×2): qty 1

## 2019-07-24 MED ORDER — DIPHENHYDRAMINE HCL 25 MG PO CAPS
ORAL_CAPSULE | ORAL | Status: AC
  Administered 2019-07-24: 25 mg via ORAL
  Filled 2019-07-24: qty 1

## 2019-07-24 MED ORDER — DIPHENOXYLATE-ATROPINE 2.5-0.025 MG PO TABS: 1.0000 | ORAL_TABLET | Freq: Four times a day (QID) | ORAL | Status: DC | PRN

## 2019-07-24 MED ORDER — ESTROGENS, CONJUGATED 0.625 MG/GM VA CREA: 1.0000 | TOPICAL_CREAM | Freq: Every day | VAGINAL | Status: DC | PRN

## 2019-07-24 MED ORDER — OMEGA-3-ACID ETHYL ESTERS 1 G PO CAPS
1.0000 g | ORAL_CAPSULE | Freq: Two times a day (BID) | ORAL | Status: DC
  Administered 2019-07-24 – 2019-07-28 (×4): 1 g via ORAL
  Filled 2019-07-24 (×9): qty 1

## 2019-07-24 MED ORDER — DULOXETINE HCL 30 MG PO CPEP
30.0000 mg | ORAL_CAPSULE | Freq: Every day | ORAL | Status: DC
  Administered 2019-07-24 – 2019-07-28 (×5): 30 mg via ORAL
  Filled 2019-07-24 (×5): qty 1

## 2019-07-24 MED ORDER — IPRATROPIUM-ALBUTEROL 0.5-2.5 (3) MG/3ML IN SOLN: 3.0000 mL | RESPIRATORY_TRACT | Status: DC | PRN

## 2019-07-24 MED ORDER — PREGABALIN 50 MG PO CAPS
100.0000 mg | ORAL_CAPSULE | Freq: Every day | ORAL | Status: DC
  Administered 2019-07-24 – 2019-07-27 (×4): 100 mg via ORAL
  Filled 2019-07-24 (×4): qty 2

## 2019-07-24 MED ORDER — PANTOPRAZOLE SODIUM 40 MG PO TBEC
40.0000 mg | DELAYED_RELEASE_TABLET | Freq: Every day | ORAL | Status: DC
  Administered 2019-07-24 – 2019-07-28 (×5): 40 mg via ORAL
  Filled 2019-07-24 (×5): qty 1

## 2019-07-24 MED ORDER — ACYCLOVIR 400 MG PO TABS
400.0000 mg | ORAL_TABLET | Freq: Two times a day (BID) | ORAL | Status: DC
  Administered 2019-07-24 – 2019-07-28 (×9): 400 mg via ORAL
  Filled 2019-07-24 (×11): qty 1

## 2019-07-24 MED ORDER — DIAZEPAM 5 MG PO TABS
5.0000 mg | ORAL_TABLET | Freq: Two times a day (BID) | ORAL | Status: DC
  Administered 2019-07-24 – 2019-07-28 (×9): 5 mg via ORAL
  Filled 2019-07-24 (×9): qty 1

## 2019-07-24 NOTE — Progress Notes (Signed)
PROGRESS NOTE    Katelyn Lamb  [id number] DOB: 1945-09-14 DOA: 07/23/2019 PCP: [name], MD   Brief Narrative:   Katelyn Lamb is a 73 y.o. female with medical history significant of anxiety, back pain, gait abnormality, memory difficulty, HSV myelitis presenting with a chief complaint of dysuria.  States she was recently diagnosed with a UTI and prescribed an antibiotic.  She has taken 4 doses so far.  Denies fevers or chills.  Denies chest pain, shortness of breath, or cough.  Denies nausea, vomiting, abdominal pain, or diarrhea.  Denies any recent sick contacts.  States she had a blood clot in her left leg a few years ago.  Patient was febrile in ER.  She was given a dose of vancomycin, aztreonam and metronidazole in ER.  She was started on IV Rocephin.  White cell count 21.6.  Urine and blood cultures have been drawn.  Assessment & Plan:   Principal Problem:   Severe sepsis ([place]) Active Problems:   UTI (urinary tract infection)   Hematuria   Acute respiratory failure with hypoxia (HCC)   Hypokalemia   Acute on chronic anemia   Fever   Leukocytosis   Sepsis ([place])  Sepsis due to UTI present on admission.  Severe sepsis secondary to UTI Febrile, tachycardic, tachypneic.  Did become hypotensive in the ED, blood pressure now improved after 1.5 L IV fluid. WBC count 21.6 with left shift.  Lactic acid normal.  UA with large amount of leukocytes, 11-20 WBCs, and many bacteria. We will DC IV fluids as patient is able to eat and drink now. Patient has tolerated Rocephin well in spite of her penicillin allergy. -Urine culture pending -Blood culture x2 pending -Continue to monitor WBC count  Hematuria-resolved   Urine amber colored and UA with large amount of RBCs.  Hemoglobin 14.3 on admission, did drop to 10.8 this morning after IV fluid resuscitation. -CT renal stone study nonconclusive due to previous contrast from CTA of chest. However patient's symptoms are better.   Outpatient follow-up with urology if persistent hematuria -Continue to monitor hemoglobin and hematocrit  Acute hypoxic respiratory failure secondary to suspected reactive airway disease Oxygen saturation 87% on room air, improved with 4 L supplemental oxygen via nasal cannula.  Per ED documentation, patient was wheezing on arrival and received albuterol.  No documented history of asthma or COPD.  CT angiogram negative for PE or pneumonia.  Showing minimal bilateral posterior basilar subsegmental atelectasis.  Currently not wheezing. -DuoNebs every 6 hours -Albuterol nebulizer as needed -Incentive spirometry  Mild hypokalemia Potassium 3.4. -Replete potassium.  Check magnesium level and replete if low.  Continue to monitor BMP.   Anemia-unknown, possibly due to chronic disease. We will order anemia panel.     Consultants:   Procedures:   Antimicrobials: Rocephin   Subjective:  Patient is feeling fine.  She denies any dysuria polyuria hematuria.  No flank pain.  No fever or chills.  She feels much better than earlier. Objective: Vitals:   07/24/19 1204 07/24/19 1225 07/24/19 1306 07/24/19 1409  BP: (!) 96/45 98/76 113/77 103/70  Pulse: 65 (!) 55 99 98  Resp: 18 13 20    Temp:    97.8 F (36.6 C)  TempSrc:      SpO2: 90% 97% 99% 100%  Weight:      Height:        Intake/Output Summary (Last 24 hours) at 07/24/2019 1824 Last data filed at 07/24/2019 1400 Gross per 24 hour  Intake 1649.32 ml  Output 1450 ml  Net 199.32 ml   Filed Weights   07/23/19 0042  Weight: 68 kg    Examination:  General exam: Appears calm and comfortable  Respiratory system: Clear to auscultation. Respiratory effort normal. Cardiovascular system: S1 & S2 heard, RRR. No JVD, murmurs, rubs, gallops or clicks. No pedal edema. Gastrointestinal system: Abdomen is nondistended, soft and nontender. No organomegaly or masses felt. Normal bowel sounds heard. Central nervous system: Alert and  oriented. No focal neurological deficits. Extremities: Symmetric 5 x 5 power. Skin: No rashes, lesions or ulcers Psychiatry: Judgement and insight appear normal. Mood & affect appropriate.     Data Reviewed: I have personally reviewed following labs and imaging studies  CBC: Recent Labs  Lab 07/23/19 0059 07/23/19 0110 07/23/19 0607 07/24/19 0515  WBC 21.6*  --  21.1* 9.4  NEUTROABS 19.2*  --   --   --   HGB 13.2 14.3 10.8* 11.3*  HCT 41.0 42.0 34.0* 36.8  MCV 98.6  --  101.5* 102.5*  PLT 204  --  [id number]   Basic Metabolic Panel: Recent Labs  Lab 07/23/19 0059 07/23/19 0110 07/23/19 0607 07/24/19 0515  NA 136 138 136 142  K 3.4* 3.3* 3.4* 4.3  CL 100 97* 104 111  CO2 25  --  23 23  GLUCOSE 141* 143* 140* 97  BUN 9 7* 10 8  CREATININE 0.81 0.90 0.84 0.73  CALCIUM 9.1  --  7.7* 8.7*  MG 1.5*  --   --   --    GFR: Estimated Creatinine Clearance: 58 mL/min (by C-G formula based on SCr of 0.73 mg/dL). Liver Function Tests: Recent Labs  Lab 07/23/19 0059  AST 19  ALT 13  ALKPHOS 108  BILITOT 1.1  PROT 7.5  ALBUMIN 3.9   No results for input(s): LIPASE, AMYLASE in the last 168 hours. No results for input(s): AMMONIA in the last 168 hours. Coagulation Profile: Recent Labs  Lab 07/23/19 0059  INR 1.1   Cardiac Enzymes: No results for input(s): CKTOTAL, CKMB, CKMBINDEX, TROPONINI in the last 168 hours. BNP (last 3 results) No results for input(s): PROBNP in the last 8760 hours. HbA1C: No results for input(s): HGBA1C in the last 72 hours. CBG: No results for input(s): GLUCAP in the last 168 hours. Lipid Profile: No results for input(s): CHOL, HDL, LDLCALC, TRIG, CHOLHDL, LDLDIRECT in the last 72 hours. Thyroid Function Tests: No results for input(s): TSH, T4TOTAL, FREET4, T3FREE, THYROIDAB in the last 72 hours. Anemia Panel: No results for input(s): VITAMINB12, FOLATE, FERRITIN, TIBC, IRON, RETICCTPCT in the last 72 hours. Sepsis Labs: Recent Labs    Lab 07/23/19 0059  LATICACIDVEN 1.3    Recent Results (from the past 240 hour(s))  SARS CORONAVIRUS 2 (TAT 6-24 HRS) Nasopharyngeal Nasopharyngeal Swab     Status: None   Collection Time: 07/23/19 12:59 AM   Specimen: Nasopharyngeal Swab  Result Value Ref Range Status   SARS Coronavirus 2 NEGATIVE NEGATIVE Final    Comment: (NOTE) SARS-CoV-2 target nucleic acids are NOT DETECTED. The SARS-CoV-2 RNA is generally detectable in upper and lower respiratory specimens during the acute phase of infection. Negative results do not preclude SARS-CoV-2 infection, do not rule out co-infections with other pathogens, and should not be used as the sole basis for treatment or other patient management decisions. Negative results must be combined with clinical observations, patient history, and epidemiological information. The expected result is Negative. Fact Sheet for Patients: SugarRoll.be Fact  Sheet for Healthcare Providers: https://www.woods-mathews.com/ This test is not yet approved or cleared by the [place] FDA and  has been authorized for detection and/or diagnosis of SARS-CoV-2 by FDA under an Emergency Use Authorization (EUA). This EUA will remain  in effect (meaning this test can be used) for the duration of the COVID-19 declaration under Section 56 4(b)(1) of the Act, 21 U.S.C. section 360bbb-3(b)(1), unless the authorization is terminated or revoked sooner. Performed at [facility], [place] [address], [place], [place] [address]   Urine culture     Status: None   Collection Time: 07/23/19 12:59 AM   Specimen: In/Out Cath Urine  Result Value Ref Range Status   Specimen Description   Final    IN/OUT CATH URINE Performed at [facility] [address] [place], [place] [address]    Special Requests   Final    NONE Performed at [facility], [place] [address] [place], [place] [address]     Culture   Final    NO GROWTH Performed at [facility], [place] [address], [place], [place] [address]    Report Status 07/23/2019 FINAL  Final  Blood Culture (routine x 2)     Status: None (Preliminary result)   Collection Time: 07/23/19  1:00 AM   Specimen: BLOOD RIGHT ARM  Result Value Ref Range Status   Specimen Description   Final    BLOOD RIGHT ARM Performed at [facility] [address] [place], [place] [address]    Special Requests   Final    BOTTLES DRAWN AEROBIC AND ANAEROBIC Blood Culture adequate volume Performed at [facility] [address], [place], [place] [address]    Culture   Final    NO GROWTH 1 DAY Performed at [facility], [place] [address], [place], [place] [address]    Report Status PENDING  Incomplete  MRSA PCR Screening     Status: None   Collection Time: 07/23/19  9:50 PM   Specimen: Nasal Mucosa; Nasopharyngeal  Result Value Ref Range Status   MRSA by PCR NEGATIVE NEGATIVE Final    Comment:        The GeneXpert MRSA Assay (FDA approved for NASAL specimens only), is one component of a comprehensive MRSA colonization surveillance program. It is not intended to diagnose MRSA infection nor to guide or monitor treatment for MRSA infections. Performed at [facility], [place] [address] [place], [place] [address]   Blood Culture (routine x 2)     Status: None (Preliminary result)   Collection Time: 07/24/19  5:15 AM   Specimen: BLOOD  Result Value Ref Range Status   Specimen Description   Final    BLOOD BLOOD RIGHT HAND Performed at [facility] [address], [place], [place] [address]    Special Requests   Final    BOTTLES DRAWN AEROBIC ONLY Blood Culture adequate volume Performed at [facility] [address], [place], [place] [address]    Culture   Final    NO GROWTH < 12 HOURS Performed at [facility] [address],  [place], [place] [address]    Report Status PENDING  Incomplete         Radiology Studies: Ct Angio Chest Pe W Or Wo Contrast  Result Date: 07/23/2019 CLINICAL DATA:  Shortness of breath. EXAM: CT ANGIOGRAPHY CHEST WITH CONTRAST TECHNIQUE: Multidetector CT imaging of the  chest was performed using the standard protocol during bolus administration of intravenous contrast. Multiplanar CT image reconstructions and MIPs were obtained to evaluate the vascular anatomy. CONTRAST:  126mL OMNIPAQUE IOHEXOL 350 MG/ML SOLN COMPARISON:  May 21, 2014. FINDINGS: Cardiovascular: Satisfactory opacification of the pulmonary arteries to the segmental level. No evidence of pulmonary embolism. Normal heart size. No pericardial effusion. Mediastinum/Nodes: No enlarged mediastinal, hilar, or axillary lymph nodes. Thyroid gland, trachea, and esophagus demonstrate no significant findings. Lungs/Pleura: No pneumothorax or pleural effusion is noted. Minimal bilateral posterior basilar subsegmental atelectasis is noted. Upper Abdomen: No acute abnormality. Musculoskeletal: No chest wall abnormality. No acute or significant osseous findings. Review of the MIP images confirms the above findings. IMPRESSION: No definite evidence of pulmonary embolus. Minimal bilateral posterior basilar subsegmental atelectasis. Electronically Signed   By: Marijo Conception M.D.   On: 07/23/2019 07:13   Dg Chest Portable 1 View  Result Date: 07/23/2019 CLINICAL DATA:  73 year old female with fever and shortness of breath. EXAM: PORTABLE CHEST 1 VIEW COMPARISON:  Chest radiographs 03/26/2018 and earlier. FINDINGS: Portable AP semi upright view at 0131 hours. Mildly lower lung volumes. Mediastinal contours remain normal. Visualized tracheal air column is within normal limits. Allowing for portable technique the lungs are clear. No acute osseous abnormality identified. IMPRESSION: Negative portable chest. Electronically Signed   By: Genevie Ann M.D.   On:  07/23/2019 01:39   Ct Renal Stone Study  Result Date: 07/23/2019 CLINICAL DATA:  Hematuria EXAM: CT ABDOMEN AND PELVIS WITHOUT CONTRAST TECHNIQUE: Multidetector CT imaging of the abdomen and pelvis was performed following the standard protocol without oral or IV contrast. Note that there is contrast throughout the collecting systems, ureters, and bladder from CT angiogram chest obtained earlier in the day. COMPARISON:  CT abdomen and pelvis November 22, 2015 FINDINGS: Lower chest: There is bibasilar atelectasis. There is no lung base edema or consolidation. Hepatobiliary: No focal liver lesions are appreciable. Gallbladder wall is not appreciably thickened. There is no biliary duct dilatation. Pancreas: There is no pancreatic mass or inflammatory focus. Spleen: No splenic lesions are evident. Adrenals/Urinary Tract: Adrenals appear normal bilaterally. There is no evident renal mass or hydronephrosis on either side. There is contrast throughout the renal collecting systems, ureters, and bladder which makes assessment for small calculi limited. No obvious obstructing lesions are identified. Urinary bladder wall thickness appears within normal limits. The extensive concentration of intravenous contrast in the bladder limits assessment of the bladder for potential lesion within the bladder. Stomach/Bowel: There is no appreciable bowel wall or mesenteric thickening. There is no evident bowel obstruction. Terminal ileum appears normal. There is no free air or portal venous air. Vascular/Lymphatic: No abdominal aortic aneurysm. There is aortic and iliac artery atherosclerosis. There is no evident adenopathy in the abdomen or pelvis. Reproductive: Uterus is absent.  No evident pelvic mass. Other: No periappendiceal region inflammation. No abscess or ascites evident in the abdomen or pelvis. Musculoskeletal: Patient has had kyphoplasty procedures at L2 and L5. There is also a degree of anterior wedging at L3 and L4. Bones  are osteoporotic. There is an old healed fracture of the left ischium. No blastic or lytic bone lesions are evident. There is spinal stenosis at L3-4 due to bony hypertrophy and disc protrusion. There is also spinal stenosis at L4-5 due to similar factors. There is also a slight degree of anterolisthesis of L4 on L5 which contributes to the stenosis. No intramuscular or abdominal wall lesions are evident. IMPRESSION: 1. There  is extensive contrast within the renal collecting systems, ureters, and bladder which limits assessment for potential smaller calculi or lesion within the bladder. Note that contrast in the urinary bladder is quite concentrated at this time period no obstructing foci are identified in either collecting system or ureter. No hydronephrosis on either side. If hematuria persists, a repeat study after intravenous contrast has cleared from the urinary system may be helpful to further assess. 2. No bowel obstruction. No abscess in the abdomen or pelvis. No periappendiceal region inflammation evident. 3.  Uterus absent. 4. Osteoporosis with multiple lumbar compression fractures. Status post kyphoplasty procedures at L2 and L5. Mild spondylolisthesis at L4-5, likely due to underlying spondylosis. Spinal stenosis at L3-4 and L4-5, multifactorial in etiology. 5.  Aortic Atherosclerosis (ICD10-I70.0). Electronically Signed   By: Lowella Grip III M.D.   On: 07/23/2019 09:37        Scheduled Meds:  acyclovir  400 mg Oral BID   Chlorhexidine Gluconate Cloth  6 each Topical Daily   diazepam  5 mg Oral BID   DULoxetine  30 mg Oral Daily   DULoxetine  60 mg Oral QHS   mouth rinse  15 mL Mouth Rinse BID   omega-3 acid ethyl esters  1 g Oral BID   pantoprazole  40 mg Oral Daily   polyethylene glycol  17 g Oral Daily   pregabalin  100 mg Oral QHS   Continuous Infusions:  cefTRIAXone (ROCEPHIN)  IV Stopped (07/24/19 1249)     LOS: 1 day    Time spent:     Jahniya Duzan [place]on [place],  MD [facility] Pager [phone]  If 7PM-7AM, please contact night-coverage [email or web address] Password [facility] 07/24/2019, 6:24 PM

## 2019-07-24 NOTE — Progress Notes (Signed)
PT Cancellation Note  Patient Details Name: Katelyn Lamb MRN: [id number] DOB: August 12, 1946   Cancelled Treatment:    Reason Eval/Treat Not Completed: Other (comment).  Pt is having a reaction to something per her report, and asked to wait for PT eval.  Try again at another time.   Ramond Dial 07/24/2019, 12:54 PM   Mee Hives, PT MS Acute Rehab Dept. Number: [place]

## 2019-07-24 NOTE — Progress Notes (Signed)
Patient complained of hands itching, believes she's having an allergic reaction to Rocephin. MD notified. Instructed to give 25 mg of PO benadryl, wait for reaction to resolve, then restart the antibiotic 15-30 minutes later. Pill given, itching resolved, restarted IV Rocephin. Currently waiting for antibiotic to finish.

## 2019-07-25 DIAGNOSIS — R6520 Severe sepsis without septic shock: Secondary | ICD-10-CM

## 2019-07-25 DIAGNOSIS — A419 Sepsis, unspecified organism: Principal | ICD-10-CM

## 2019-07-25 LAB — CBC
HCT: 36.1 % (ref 36.0–46.0)
Hemoglobin: 11.3 g/dL — ABNORMAL LOW (ref 12.0–15.0)
MCH: 31.7 pg (ref 26.0–34.0)
MCHC: 31.3 g/dL (ref 30.0–36.0)
MCV: 101.1 fL — ABNORMAL HIGH (ref 80.0–100.0)
Platelets: 251 10*3/uL (ref 150–400)
RBC: 3.57 MIL/uL — ABNORMAL LOW (ref 3.87–5.11)
RDW: 13.6 % (ref 11.5–15.5)
WBC: 7.4 10*3/uL (ref 4.0–10.5)
nRBC: 0 % (ref 0.0–0.2)

## 2019-07-25 LAB — BASIC METABOLIC PANEL
Anion gap: 10 (ref 5–15)
BUN: 7 mg/dL — ABNORMAL LOW (ref 8–23)
CO2: 22 mmol/L (ref 22–32)
Calcium: 9.1 mg/dL (ref 8.9–10.3)
Chloride: 110 mmol/L (ref 98–111)
Creatinine, Ser: 0.68 mg/dL (ref 0.44–1.00)
GFR calc Af Amer: >60 mL/min (ref >60)
GFR calc non Af Amer: >60 mL/min (ref >60)
Glucose, Bld: 89 mg/dL (ref 70–99)
Potassium: 4 mmol/L (ref 3.5–5.1)
Sodium: 142 mmol/L (ref 135–145)

## 2019-07-25 LAB — VITAMIN B12: Vitamin B-12: 1152 pg/mL — ABNORMAL HIGH (ref 180–914)

## 2019-07-25 LAB — FOLATE: Folate: 6.8 ng/mL (ref >5.9)

## 2019-07-25 LAB — IRON AND TIBC
Iron: 40 ug/dL (ref 28–170)
Saturation Ratios: 11 % (ref 10.4–31.8)
TIBC: 352 ug/dL (ref 250–450)
UIBC: 312 ug/dL

## 2019-07-25 LAB — RETICULOCYTES
Immature Retic Fract: 15.9 % (ref 2.3–15.9)
RBC.: 3.57 MIL/uL — ABNORMAL LOW (ref 3.87–5.11)
Retic Count, Absolute: 53.9 10*3/uL (ref 19.0–186.0)
Retic Ct Pct: 1.5 % (ref 0.4–3.1)

## 2019-07-25 LAB — FERRITIN: Ferritin: 23 ng/mL (ref 11–307)

## 2019-07-25 MED ORDER — FAMOTIDINE IN NACL 20-0.9 MG/50ML-% IV SOLN
20.0000 mg | Freq: Two times a day (BID) | INTRAVENOUS | Status: DC
  Administered 2019-07-25 – 2019-07-28 (×7): 20 mg via INTRAVENOUS
  Filled 2019-07-25 (×7): qty 50

## 2019-07-25 MED ORDER — IPRATROPIUM-ALBUTEROL 0.5-2.5 (3) MG/3ML IN SOLN: 3.0000 mL | RESPIRATORY_TRACT | 0 refills | Status: DC | PRN

## 2019-07-25 MED ORDER — METHYLPREDNISOLONE SODIUM SUCC 125 MG IJ SOLR
125.0000 mg | Freq: Once | INTRAMUSCULAR | Status: AC
  Administered 2019-07-25: 125 mg via INTRAVENOUS
  Filled 2019-07-25: qty 2

## 2019-07-25 MED ORDER — DIPHENHYDRAMINE HCL 25 MG PO CAPS
25.0000 mg | ORAL_CAPSULE | Freq: Three times a day (TID) | ORAL | Status: DC
  Administered 2019-07-24 – 2019-07-28 (×9): 25 mg via ORAL
  Filled 2019-07-25 (×9): qty 1

## 2019-07-25 MED ORDER — SODIUM CHLORIDE 0.9 % IV SOLN
2.0000 g | Freq: Three times a day (TID) | INTRAVENOUS | Status: DC
  Administered 2019-07-25 – 2019-07-28 (×8): 2 g via INTRAVENOUS
  Filled 2019-07-25 (×10): qty 2

## 2019-07-25 MED ORDER — CEFDINIR 300 MG PO CAPS: 300.0000 mg | ORAL_CAPSULE | Freq: Two times a day (BID) | ORAL | 0 refills | Status: DC

## 2019-07-25 MED ORDER — CEFDINIR 300 MG PO CAPS
300.0000 mg | ORAL_CAPSULE | Freq: Two times a day (BID) | ORAL | Status: DC
  Administered 2019-07-25: 300 mg via ORAL
  Filled 2019-07-25 (×2): qty 1

## 2019-07-25 MED ORDER — PREDNISONE 20 MG PO TABS
40.0000 mg | ORAL_TABLET | Freq: Once | ORAL | Status: AC
  Administered 2019-07-26: 40 mg via ORAL
  Filled 2019-07-25: qty 2

## 2019-07-25 NOTE — Progress Notes (Signed)
Paged MD to make aware that pt is complaining of an itching sensation when receiving Rocephin. Will hold off on administering antibiotic until MD returns call. Will continue to monitor.

## 2019-07-25 NOTE — TOC Progression Note (Signed)
Transition of Care [facility]) - Progression Note    Patient Details  Name: DOMINGUE DELORGE MRN: [id number] Date of Birth: 06/08/1946  Transition of Care [facility]) CM/SW Contact  Joaquin Courts, RN Phone Number: 07/25/2019, 3:58 PM  Clinical Narrative:   Patient set up with [facility] for HHPT/OT. [employer] to deliver nebulizer machine to bedside for home use.    Expected Discharge Plan: [place] Barriers to Discharge: Continued Medical Work up  Expected Discharge Plan and Services Expected Discharge Plan: [place]   Discharge Planning Services: CM Consult Post Acute Care Choice: [place] arrangements for the past 2 months: Single Family Home Expected Discharge Date: 07/25/19               DME Arranged: Nebulizer machine DME Agency: [employer] Date DME Agency Contacted: 07/25/19 Time DME Agency Contacted: [phone] Representative spoke with at DME Agency: [place]: OT, PT [place] Agency: [place] ([place]) Date [place]: 07/25/19 Time [place]: [place] Representative spoke with at [place]: [place] ([place]) Interventions    Readmission Risk Interventions No flowsheet data found.

## 2019-07-25 NOTE — Progress Notes (Signed)
PROGRESS NOTE     Katelyn Lamb  [id number] DOB: [birth year] DOA: 07/23/2019 PCP: [name], MD   Brief Narrative:   Katelyn Lamb is a 73 y.o. female with medical history significant of anxiety, back pain, gait abnormality, memory difficulty, HSV myelitis presenting with a chief complaint of dysuria.  States she was recently diagnosed with a UTI and prescribed an antibiotic.  She has taken 4 doses so far.  Denies fevers or chills.  Denies chest pain, shortness of breath, or cough.  Denies nausea, vomiting, abdominal pain, or diarrhea.  Denies any recent sick contacts.  States she had a blood clot in her left leg a few years ago.  Patient was febrile in ER.  She was given a dose of vancomycin, aztreonam and metronidazole in ER.  She was started on IV Rocephin.  White cell count 21.6.  Urine and blood cultures have been drawn.  07/25/2019: Patient seen alongside patient's husband.  Patient is eager to be discharged back home.  However, patient has multiple drug allergies.  Tried to oral cefdinir but patient developed significant allergic reaction as per patient's nurse.  Patient reported feeling of the throat closing up.  Patient was treated with IV Solu-Medrol, IV Pepcid and Benadryl.  Cefdinir has been discontinued.  Discussed with the pharmacist, will start patient on IV aztreonam.  Patient may need to remain in the hospital to complete course of antibiotics.  Meanwhile, cultures have not grown any organisms as patient was on antibiotics prior to presentation.  Assessment & Plan:   Principal Problem:   Severe sepsis ([place]) Active Problems:   UTI (urinary tract infection)   Hematuria   Acute respiratory failure with hypoxia (HCC)   Hypokalemia   Acute on chronic anemia   Fever   Leukocytosis   Sepsis (HCC)  Severe sepsis, likely secondary to UTI: Febrile, tachycardic, tachypneic.  Did become hypotensive in the ED, blood pressure now improved after 1.5 L IV fluid. WBC count 21.6  with left shift.  Lactic acid normal.  UA with large amount of leukocytes, 11-20 WBCs, and many bacteria. We will DC IV fluids as patient is able to eat and drink now. Patient has tolerated Rocephin well in spite of her penicillin allergy. -Urine culture pending -Blood culture x2 pending -Continue to monitor WBC count 07/25/2019: Urine culture has not grown any organism.  Patient was on antibiotics prior to presentation.  Patient developed allergic reaction to cefdinir.  Will start patient on IV aztreonam.  Patient may remain in the hospital is complete course of antibiotics intravenously.  Hematuria:  Urine amber colored and UA with large amount of RBCs.  Hemoglobin 14.3 on admission, did drop to 10.8 this morning after IV fluid resuscitation. -CT renal stone study nonconclusive due to previous contrast from CTA of chest. However patient's symptoms are better.  Outpatient follow-up with urology if persistent hematuria -Continue to monitor hemoglobin and hematocrit 07/25/2019: Likely related to UTI.  Resolved.  CT renal stone study was not optimal.  Acute hypoxic respiratory failure: -Likely secondary to suspected reactive airway disease. Oxygen saturation 87% on room air, improved with 4 L supplemental oxygen via nasal cannula.  Per ED documentation, patient was wheezing on arrival and received albuterol.  No documented history of asthma or COPD.  CT angiogram negative for PE or pneumonia.  Showing minimal bilateral posterior basilar subsegmental atelectasis.  Currently not wheezing. -DuoNebs every 6 hours -Albuterol nebulizer as needed -Incentive spirometry 07/25/2019: Stable.  Resolved significantly.  Mild  hypokalemia Potassium 3.4. -Replete potassium.  Check magnesium level and replete if low.  Continue to monitor BMP. 07/25/2019: Potassium is 4 today.  Allergic reaction to cefdinir: -Patient has multiple drug allergies. -Patient has severe allergy to penicillin -Patient was  already complaining about ceftriaxone -Trial of sildenafil, as patient developed allergies as well -IV Solu-Medrol 125 Mg given, IV Pepcid 20 mg twice daily ordered, and Benadryl. -Further management depend on hospital course.  Consultants:  None  Procedures:  None  Antimicrobials:  -Discontinue IV Rocephin -Tried cefdinir, but patient developed allergy -Start IV aztreonam today, 07/25/2019  Subjective: Eager to be discharged back home  Developed allergy to cefdinir  Objective: Vitals:   07/24/19 1306 07/24/19 1409 07/24/19 2116 07/25/19 0524  BP: 113/77 103/70 101/67 132/85  Pulse: 99 98 79 78  Resp: 20  16 16   Temp:  97.8 F (36.6 C) 99.1 F (37.3 C) 98.1 F (36.7 C)  TempSrc:   Oral Oral  SpO2: 99% 100% 97% 96%  Weight:      Height:        Intake/Output Summary (Last 24 hours) at 07/25/2019 1121 Last data filed at 07/25/2019 0530 Gross per 24 hour  Intake 932.46 ml  Output 2150 ml  Net -1217.54 ml   Filed Weights   07/23/19 0042  Weight: 68 kg    Examination:  General exam: Appears calm and comfortable  Respiratory system: Clear to auscultation. Respiratory effort normal. Cardiovascular system: S1 & S2 heard Gastrointestinal system: Abdomen is nondistended, soft and nontender. No organomegaly or masses felt. Normal bowel sounds heard. Central nervous system: Alert and oriented.  Patient moves all extremities Extremities: No leg edema  Data Reviewed: I have personally reviewed following labs and imaging studies  CBC: Recent Labs  Lab 07/23/19 0059 07/23/19 0110 07/23/19 0607 07/24/19 0515 07/25/19 0553  WBC 21.6*  --  21.1* 9.4 7.4  NEUTROABS 19.2*  --   --   --   --   HGB 13.2 14.3 10.8* 11.3* 11.3*  HCT 41.0 42.0 34.0* 36.8 36.1  MCV 98.6  --  101.5* 102.5* 101.1*  PLT 204  --  159 154 [id number]   Basic Metabolic Panel: Recent Labs  Lab 07/23/19 0059 07/23/19 0110 07/23/19 0607 07/24/19 0515 07/25/19 0553  NA 136 138 136 142 142  K 3.4*  3.3* 3.4* 4.3 4.0  CL 100 97* 104 111 110  CO2 25  --  23 23 22   GLUCOSE 141* 143* 140* 97 89  BUN 9 7* 10 8 7*  CREATININE 0.81 0.90 0.84 0.73 0.68  CALCIUM 9.1  --  7.7* 8.7* 9.1  MG 1.5*  --   --   --   --    GFR: Estimated Creatinine Clearance: 58 mL/min (by C-G formula based on SCr of 0.68 mg/dL). Liver Function Tests: Recent Labs  Lab 07/23/19 0059  AST 19  ALT 13  ALKPHOS 108  BILITOT 1.1  PROT 7.5  ALBUMIN 3.9   No results for input(s): LIPASE, AMYLASE in the last 168 hours. No results for input(s): AMMONIA in the last 168 hours. Coagulation Profile: Recent Labs  Lab 07/23/19 0059  INR 1.1   Cardiac Enzymes: No results for input(s): CKTOTAL, CKMB, CKMBINDEX, TROPONINI in the last 168 hours. BNP (last 3 results) No results for input(s): PROBNP in the last 8760 hours. HbA1C: No results for input(s): HGBA1C in the last 72 hours. CBG: No results for input(s): GLUCAP in the last 168 hours. Lipid Profile: No results  for input(s): CHOL, HDL, LDLCALC, TRIG, CHOLHDL, LDLDIRECT in the last 72 hours. Thyroid Function Tests: No results for input(s): TSH, T4TOTAL, FREET4, T3FREE, THYROIDAB in the last 72 hours. Anemia Panel: Recent Labs    07/25/19 0553  VITAMINB12 1,152*  FOLATE 6.8  FERRITIN 23  TIBC 352  IRON 40  RETICCTPCT 1.5   Sepsis Labs: Recent Labs  Lab 07/23/19 0059  LATICACIDVEN 1.3    Recent Results (from the past 240 hour(s))  SARS CORONAVIRUS 2 (TAT 6-24 HRS) Nasopharyngeal Nasopharyngeal Swab     Status: None   Collection Time: 07/23/19 12:59 AM   Specimen: Nasopharyngeal Swab  Result Value Ref Range Status   SARS Coronavirus 2 NEGATIVE NEGATIVE Final    Comment: (NOTE) SARS-CoV-2 target nucleic acids are NOT DETECTED. The SARS-CoV-2 RNA is generally detectable in upper and lower respiratory specimens during the acute phase of infection. Negative results do not preclude SARS-CoV-2 infection, do not rule out co-infections with other  pathogens, and should not be used as the sole basis for treatment or other patient management decisions. Negative results must be combined with clinical observations, patient history, and epidemiological information. The expected result is Negative. Fact Sheet for Patients: SugarRoll.be Fact Sheet for Healthcare Providers: https://www.woods-mathews.com/ This test is not yet approved or cleared by the [place] FDA and  has been authorized for detection and/or diagnosis of SARS-CoV-2 by FDA under an Emergency Use Authorization (EUA). This EUA will remain  in effect (meaning this test can be used) for the duration of the COVID-19 declaration under Section 56 4(b)(1) of the Act, 21 U.S.C. section 360bbb-3(b)(1), unless the authorization is terminated or revoked sooner. Performed at [facility], [place] [address] [place], [place] [address]   Urine culture     Status: None   Collection Time: 07/23/19 12:59 AM   Specimen: In/Out Cath Urine  Result Value Ref Range Status   Specimen Description   Final    IN/OUT CATH URINE Performed at [facility] [address] [place], [place] [address]    Special Requests   Final    NONE Performed at [facility], [place] [address], [place], [place] [address]    Culture   Final    NO GROWTH Performed at [facility], [place] [address] [place], [place] [address]    Report Status 07/23/2019 FINAL  Final  Blood Culture (routine x 2)     Status: None (Preliminary result)   Collection Time: 07/23/19  1:00 AM   Specimen: BLOOD RIGHT ARM  Result Value Ref Range Status   Specimen Description   Final    BLOOD RIGHT ARM Performed at [facility] [address] [place], [place] [address]    Special Requests   Final    BOTTLES DRAWN AEROBIC AND ANAEROBIC Blood Culture adequate volume Performed at [facility]  [address], [place], [place] [address]    Culture   Final    NO GROWTH 2 DAYS Performed at [facility] [address], [place], [place] [address]    Report Status PENDING  Incomplete  MRSA PCR Screening     Status: None   Collection Time: 07/23/19  9:50 PM   Specimen: Nasal Mucosa; Nasopharyngeal  Result Value Ref Range Status   MRSA by PCR NEGATIVE NEGATIVE Final    Comment:        The GeneXpert MRSA Assay (FDA approved for NASAL specimens only), is  one component of a comprehensive MRSA colonization surveillance program. It is not intended to diagnose MRSA infection nor to guide or monitor treatment for MRSA infections. Performed at [facility], [place] [address], [place], [place] [address]   Blood Culture (routine x 2)     Status: None (Preliminary result)   Collection Time: 07/24/19  5:15 AM   Specimen: BLOOD RIGHT HAND  Result Value Ref Range Status   Specimen Description   Final    BLOOD RIGHT HAND Performed at [facility], [place] [address] [place], [place] [address]    Special Requests   Final    BOTTLES DRAWN AEROBIC ONLY Blood Culture adequate volume Performed at [facility] [address] [place], [place] [address]    Culture   Final    NO GROWTH 1 DAY Performed at [facility], [place] [address], [place],  [address]    Report Status PENDING  Incomplete         Radiology Studies: No results found.      Scheduled Meds: . acyclovir  400 mg Oral BID  . Chlorhexidine Gluconate Cloth  6 each Topical Daily  . diazepam  5 mg Oral BID  . DULoxetine  30 mg Oral Daily  . DULoxetine  60 mg Oral QHS  . mouth rinse  15 mL Mouth Rinse BID  . omega-3 acid ethyl esters  1 g Oral BID  . pantoprazole  40 mg Oral Daily  . polyethylene glycol  17 g Oral Daily  . pregabalin  100 mg Oral QHS   Continuous Infusions: . cefTRIAXone (ROCEPHIN)  IV Stopped (07/24/19 1249)     LOS: 2 days    Time spent:      Bonnell Public, MD [facility] Pager [phone]  If 7PM-7AM, please contact night-coverage [email or web address] Password [facility] 07/25/2019, 11:21 AM

## 2019-07-25 NOTE — Progress Notes (Signed)
Pharmacy Antibiotic Note  Katelyn Lamb is a 73 y.o. female admitted on 07/23/2019 with sepsis.  Pharmacy has been consulted for aztreonam dosing.  Patient admitted with chief complaint of dysuria, on antibiotics for sepsis of unknown origin. Pt has a documented PCN allergy, received ceftriaxone in 2019. Pt started on ceftriaxone on admission 11/19, was transitioned to cefdinir today. Allergic reaction reported after cefdinir dose (see RN note from 11/21). Antibiotics changed back to aztreonam. Asked RN to update allergy profile to reflect reaction.   Today, 07/25/19  WBC 7.4  SCr 0.7, CrCl 58 mL/min  Afebrile  Day  #3 of antibiotics  Patient also on acyclovir, which she takes chronically PTA.  Plan:  Initiate aztreonam 2 g IV q8h  Do not anticipate dose adjustment due to renal function, pharmacy to sign off. Please reconsult if needed  Height: 5\' 5"  (165.1 cm) Weight: 150 lb (68 kg) IBW/kg (Calculated) : 57  Temp (24hrs), Avg:98.7 F (37.1 C), Min:98.1 F (36.7 C), Max:99.1 F (37.3 C)  Recent Labs  Lab 07/23/19 0059 07/23/19 0110 07/23/19 0607 07/24/19 0515 07/25/19 0553  WBC 21.6*  --  21.1* 9.4 7.4  CREATININE 0.81 0.90 0.84 0.73 0.68  LATICACIDVEN 1.3  --   --   --   --     Estimated Creatinine Clearance: 58 mL/min (by C-G formula based on SCr of 0.68 mg/dL).    Allergies  Allergen Reactions  . Demerol [Meperidine] Other (See Comments)    Hallucinations  . Percocet [Oxycodone-Acetaminophen] Itching  . Amoxicillin-Pot Clavulanate Diarrhea    Severe pain, headache, intestinal infection  . Penicillins Itching and Rash    Has patient had a PCN reaction causing immediate rash, facial/tongue/throat swelling, SOB or lightheadedness with hypotension:  NO Has patient had a PCN reaction causing severe rash involving mucus membranes or skin necrosis: No Has patient had a PCN reaction that required hospitalization: No Has patient had a PCN reaction occurring within  the last 10 years: Yes If all of the above answers are "NO", then may proceed with Cephalosporin use.    Antimicrobials this admission: Aztreonam 11/21 >> ceftriaxone 11/19 >> 11/20 cefdinir 11/21 one dose Levofloxacin on 11/19 Metronidazole on 11/19 Acyclovir 11/20 >>   Dose adjustments this admission:  Microbiology results: 11/20 BCx: no growth 1 day 11/20 UCx: NGF  11/20 MRSA PCR: Negative 11/19 SARS-2: Negative  Thank you for allowing pharmacy to be a part of this patient's care.  Lenis Noon, PharmD 07/25/2019 3:42 PM

## 2019-07-25 NOTE — Progress Notes (Signed)
Administered PO Omnicef at 1404 and pt stated that her hands were burning and that her throat felt like it was closing and she has a headache. Alerted Camera operator and RR came to bedside. Paged MD. Awaiting orders. Will continue to monitor.

## 2019-07-25 NOTE — Evaluation (Addendum)
Occupational Therapy Evaluation Patient Details Name: Katelyn Lamb MRN: [id number] DOB: 1945-09-19 Today's Date: 07/25/2019    History of Present Illness Katelyn Lamb is a 73 y.o. female with medical history significant of anxiety, back pain, gait abnormality, memory difficulty, HSV myelitis presenting with a chief complaint of dysuria.  States she was recently diagnosed with a UTI   Clinical Impression   Pt admitted with above diagnoses, presenting with decreased activity tolerance and generalized weakness limiting ability to engage in BADL at desired level of independence. PTA pt living with spouse, whom assisted with some dynamic BADL and all IADL. At time of eval, pt is min A for bed mobility and min guard- min A for functional transfers. Pt able to stand at sink to complete grooming tasks with min guard assist. She generally requires min A for all other BADL at this time. She has cognitive deficits at baseline with word finding and memory. Given current status, recommend HHOT at d/c for safe progression of BADL in home environment. Will continue to follow per POC listed below.     Follow Up Recommendations  Home health OT;Supervision/Assistance - 24 hour    Equipment Recommendations  None recommended by OT    Recommendations for Other Services       Precautions / Restrictions Precautions Precautions: Fall Restrictions Weight Bearing Restrictions: No      Mobility Bed Mobility Overal bed mobility: Needs Assistance Bed Mobility: Supine to Sit;Sit to Supine     Supine to sit: Min assist Sit to supine: Min assist   General bed mobility comments: increased time and use of rails. Min A for LB management  Transfers Overall transfer level: Needs assistance Equipment used: Rolling walker (2 wheeled) Transfers: Sit to/from Stand Sit to Stand: Min guard;Min assist         General transfer comment: cues for sage hand placement    Balance Overall balance assessment: Needs  assistance Sitting-balance support: Bilateral upper extremity supported;Feet supported Sitting balance-Leahy Scale: Fair     Standing balance support: During functional activity;Bilateral upper extremity supported Standing balance-Leahy Scale: Poor Standing balance comment: relies on RW and support                           ADL either performed or assessed with clinical judgement   ADL Overall ADL's : Needs assistance/impaired Eating/Feeding: Set up;Sitting   Grooming: Min guard;Standing;Oral care Grooming Details (indicate cue type and reason): to brush teeth at sink Upper Body Bathing: Minimal assistance;Sitting   Lower Body Bathing: Minimal assistance;Sit to/from stand;Sitting/lateral leans   Upper Body Dressing : Minimal assistance;Sitting   Lower Body Dressing: Minimal assistance;Sitting/lateral leans;Sit to/from stand   Toilet Transfer: Minimal assistance;Regular Toilet;Grab bars;RW;Ambulation   Toileting- Clothing Manipulation and Hygiene: Minimal assistance;Sit to/from stand Toileting - Clothing Manipulation Details (indicate cue type and reason): husband assists at baseline Tub/ Shower Transfer: Minimal assistance;Shower seat;Ambulation;Rolling walker   Functional mobility during ADLs: Minimal assistance;Rolling walker;Cueing for safety;Cueing for sequencing General ADL Comments: pt limited by baseline cognitive deficits and generalized weakness with decreased activity tolerance     Vision Baseline Vision/History: Wears glasses Wears Glasses: At all times Patient Visual Report: No change from baseline Additional Comments: pt does share that she has to focus on items when moving them or else she will think she is holding said item and is not.     Perception     Praxis      Pertinent Vitals/Pain Pain  Assessment: No/denies pain     Hand Dominance Right   Extremity/Trunk Assessment Upper Extremity Assessment Upper Extremity Assessment: Generalized  weakness   Lower Extremity Assessment Lower Extremity Assessment: Defer to PT evaluation   Cervical / Trunk Assessment Cervical / Trunk Assessment: Normal   Communication Communication Communication: No difficulties;HOH   Cognition Arousal/Alertness: Awake/alert Behavior During Therapy: WFL for tasks assessed/performed Overall Cognitive Status: History of cognitive impairments - at baseline                                 General Comments: reports memory impairments, word finding difficulties at baseline. Pt aslo very tangential between stories and sometimes not finishing thought before switching to next topic   General Comments       Exercises     Shoulder Instructions      Home Living Family/patient expects to be discharged to:: Private residence Living Arrangements: Spouse/significant other Available Help at Discharge: Available 24 hours/day;Family Type of Home: House Home Access: Stairs to enter [employer] of Steps: 2 Entrance Stairs-Rails: None Home Layout: Two level;Able to live on main level with bedroom/bathroom     Bathroom Shower/Tub: Tub/shower unit;Curtain   Bathroom Toilet: Standard     Home Equipment: Environmental consultant - 2 wheels;Bedside commode;Wheelchair - manual   Additional Comments: spouse assist as needed      Prior Functioning/Environment Level of Independence: Needs assistance  Gait / Transfers Assistance Needed: occasional use of RW as needed and support from spouse ADL's / Homemaking Assistance Needed: pt states her husband has to still help her with peri care/ husband manages IADLs            OT Problem List: Decreased strength;Decreased knowledge of use of DME or AE;Decreased coordination;Decreased activity tolerance;Decreased cognition;Impaired UE functional use;Impaired balance (sitting and/or standing);Impaired sensation      OT Treatment/Interventions: Self-care/ADL training;Therapeutic exercise;Patient/family  education;Neuromuscular education;Balance training;Energy conservation;Therapeutic activities;DME and/or AE instruction;Cognitive remediation/compensation    OT Goals(Current goals can be found in the care plan section) Acute Rehab OT Goals Patient Stated Goal: go home today OT Goal Formulation: With patient Time For Goal Achievement: 08/08/19 Potential to Achieve Goals: Good  OT Frequency: Min 2X/week   Barriers to D/C:            Co-evaluation              AM-PAC OT "6 Clicks" Daily Activity     Outcome Measure Help from another person eating meals?: A Little Help from another person taking care of personal grooming?: A Little Help from another person toileting, which includes using toliet, bedpan, or urinal?: A Little Help from another person bathing (including washing, rinsing, drying)?: A Little Help from another person to put on and taking off regular upper body clothing?: A Little Help from another person to put on and taking off regular lower body clothing?: A Little 6 Click Score: 18   End of Session Equipment Utilized During Treatment: Gait belt;Rolling walker Nurse Communication: Mobility status  Activity Tolerance: Patient tolerated treatment well Patient left: in bed;with call bell/phone within reach;with bed alarm set  OT Visit Diagnosis: Unsteadiness on feet (R26.81);Other abnormalities of gait and mobility (R26.89);Other symptoms and signs involving the nervous system (R29.898);Other symptoms and signs involving cognitive function;Muscle weakness (generalized) (M62.81)                Time: [id number] OT Time Calculation (min): 46 min Charges:  OT General Charges $  OT Visit: 1 Visit OT Evaluation $OT Eval Moderate Complexity: 1 Mod OT Treatments $Self Care/Home Management : 23-37 mins  Zenovia Jarred, MSOT, OTR/L Behavioral Health OT/ Acute Relief OT [facility] Office: [phone]  Zenovia Jarred 07/25/2019, 1:14 PM

## 2019-07-25 NOTE — Evaluation (Signed)
Physical Therapy Evaluation Patient Details Name: Katelyn Lamb MRN: [id number] DOB: 29-Apr-1946 Today's Date: 07/25/2019   History of Present Illness  Katelyn Lamb is a 73 y.o. female with medical history significant of anxiety, back pain, gait abnormality, memory difficulty, HSV myelitis presenting with a chief complaint of dysuria.  States she was recently diagnosed with a UTI  Clinical Impression  Patient presents with decreased balance.Patient reports that her balance varies due to her "Brain  Problem" > ptatient reports spouse assists with ambulation as needed. Patient SPO2 .94% while ambulating. Pt admitted with above diagnosis.  Pt currently with functional limitations due to the deficits listed below (see PT Problem List). Pt will benefit from skilled PT to increase their independence and safety with mobility to allow discharge to the venue listed below.       Follow Up Recommendations Home health PT;Supervision/Assistance - 24 hour    Equipment Recommendations  None recommended by PT    Recommendations for Other Services       Precautions / Restrictions Precautions Precautions: Fall      Mobility  Bed Mobility               General bed mobility comments: OOB  Transfers Overall transfer level: Needs assistance Equipment used: Rolling walker (2 wheeled) Transfers: Sit to/from Stand           General transfer comment: cues to push from recliner and reach back.  Ambulation/Gait Ambulation/Gait assistance: Min assist Gait Distance (Feet): 60 Feet Assistive device: Rolling walker (2 wheeled) Gait Pattern/deviations: Step-through pattern;Drifts right/left;Decreased stride length Gait velocity: decr   General Gait Details: tends to veer to left, required assist to keep straight line  Stairs            Wheelchair Mobility    Modified Rankin (Stroke Patients Only)       Balance Overall balance assessment: Needs assistance Sitting-balance support:  Bilateral upper extremity supported;Feet supported Sitting balance-Leahy Scale: Fair     Standing balance support: During functional activity;Bilateral upper extremity supported Standing balance-Leahy Scale: Poor Standing balance comment: relies on RW and support                             Pertinent Vitals/Pain      Home Living Family/patient expects to be discharged to:: Private residence Living Arrangements: Spouse/significant other Available Help at Discharge: Available 24 hours/day Type of Home: House Home Access: Stairs to enter Entrance Stairs-Rails: None Entrance Stairs-Number of Steps: 2 Home Layout: Two level;Able to live on main level with bedroom/bathroom Home Equipment: Gilford Rile - 2 wheels;Bedside commode;Wheelchair - manual Additional Comments: states spouse assists when balance is off.    Prior Function                 Hand Dominance   Dominant Hand: Right    Extremity/Trunk Assessment   Upper Extremity Assessment Upper Extremity Assessment: Generalized weakness    Lower Extremity Assessment Lower Extremity Assessment: Generalized weakness    Cervical / Trunk Assessment Cervical / Trunk Assessment: Normal  Communication   Communication: No difficulties;HOH  Cognition Arousal/Alertness: Awake/alert Behavior During Therapy: WFL for tasks assessed/performed Overall Cognitive Status: History of cognitive impairments - at baseline                                 General Comments: patient reports that her memeory and word  finding affected by her dx.      General Comments      Exercises     Assessment/Plan    PT Assessment Patient needs continued PT services  PT Problem List Decreased mobility;Decreased safety awareness;Decreased knowledge of precautions;Decreased activity tolerance;Decreased balance;Decreased knowledge of use of DME       PT Treatment Interventions DME instruction;Therapeutic activities;Gait  training;Therapeutic exercise;Patient/family education    PT Goals (Current goals can be found in the Care Plan section)  Acute Rehab PT Goals Patient Stated Goal: go home today PT Goal Formulation: With patient Time For Goal Achievement: 08/08/19 Potential to Achieve Goals: Good    Frequency Min 3X/week   Barriers to discharge        Co-evaluation               AM-PAC PT "6 Clicks" Mobility  Outcome Measure Help needed turning from your back to your side while in a flat bed without using bedrails?: A Little Help needed moving from lying on your back to sitting on the side of a flat bed without using bedrails?: A Little Help needed moving to and from a bed to a chair (including a wheelchair)?: A Little Help needed standing up from a chair using your arms (e.g., wheelchair or bedside chair)?: A Little Help needed to walk in hospital room?: A Little Help needed climbing 3-5 steps with a railing? : A Lot 6 Click Score: 17    End of Session   Activity Tolerance: Patient tolerated treatment well Patient left: in chair;with call bell/phone within reach Nurse Communication: Mobility status PT Visit Diagnosis: Unsteadiness on feet (R26.81);Other symptoms and signs involving the nervous system (R29.898)    Time: [id number] PT Time Calculation (min) (ACUTE ONLY): 25 min   Charges:   PT Evaluation $PT Eval Low Complexity: 1 Low PT Treatments $Gait Training: 8-22 mins        Tresa Endo PT Acute Rehabilitation Services Pager [phone] Office [phone]   [name] 07/25/2019, 9:32 AM

## 2019-07-25 NOTE — Progress Notes (Signed)
    Home health agencies that serve [phone].        [place] Quality of Patient Care Rating Patient Survey Summary Rating  [facility] [phone]  out of 5 stars 4 out of [place] [phone] 4  out of 5 stars 5 out of [place] [phone]) [phone] 4  out of 5 stars 4 out of [place] [phone] 4  out of 5 stars 5 out of [place] [phone] 4 out of 5 stars 4 out of [place] [phone] 4 out of 5 stars 4 out of 5 stars  [facility] [phone] 3  out of 5 stars 4 out of [place] [phone] 3 out of 5 stars 4 out of 5 stars  [facility] ([phone]) [phone] 4  out of 5 stars 3 out of [place] [phone] 3  out of 5 stars 4 out of [place] [phone] 3  out of 5 stars 4 out of [place] [phone] 4  out of 5 stars 4 out of [place] [phone] 4  out of 5 stars 3 out of [place] number Footnote as displayed on Wink  1 This agency provides services under a federal waiver program to non-traditional, chronic long term population.  2 This agency provides services to a special needs population.  3 Not Available.  4 The number of patient episodes for this measure is too small to report.  5 This measure currently does not have data or provider has been certified/recertified for less than 6 months.  6 The national average for this measure is not provided because of state-to-state differences in data collection.  7 Medicare is not displaying rates for this measure for any home health agency, because of an issue with  the data.  8 There were problems with the data and they are being corrected.  9 Zero, or very few, patients met the survey's rules for inclusion. The scores shown, if any, reflect a very small number of surveys and may not accurately tell how an agency is doing.  10 Survey results are based on less than 12 months of data.  11 Fewer than 70 patients completed the survey. Use the scores shown, if any, with caution as the number of surveys may be too low to accurately tell how an agency is doing.  12 No survey results are available for this period.  13 Data suppressed by CMS for one or more quarters.

## 2019-07-26 LAB — BASIC METABOLIC PANEL
Anion gap: 9 (ref 5–15)
BUN: 11 mg/dL (ref 8–23)
CO2: 26 mmol/L (ref 22–32)
Calcium: 9.1 mg/dL (ref 8.9–10.3)
Chloride: 103 mmol/L (ref 98–111)
Creatinine, Ser: 0.84 mg/dL (ref 0.44–1.00)
GFR calc Af Amer: >60 mL/min (ref >60)
GFR calc non Af Amer: >60 mL/min (ref >60)
Glucose, Bld: 148 mg/dL — ABNORMAL HIGH (ref 70–99)
Potassium: 4.6 mmol/L (ref 3.5–5.1)
Sodium: 138 mmol/L (ref 135–145)

## 2019-07-26 LAB — CBC
HCT: 37.9 % (ref 36.0–46.0)
Hemoglobin: 11.9 g/dL — ABNORMAL LOW (ref 12.0–15.0)
MCH: 31.1 pg (ref 26.0–34.0)
MCHC: 31.4 g/dL (ref 30.0–36.0)
MCV: 99 fL (ref 80.0–100.0)
Platelets: 218 10*3/uL (ref 150–400)
RBC: 3.83 MIL/uL — ABNORMAL LOW (ref 3.87–5.11)
RDW: 13.7 % (ref 11.5–15.5)
WBC: 4.9 10*3/uL (ref 4.0–10.5)
nRBC: 0 % (ref 0.0–0.2)

## 2019-07-26 NOTE — Progress Notes (Signed)
Occupational Therapy Treatment Patient Details Name: Katelyn Lamb MRN: [id number] DOB: March 03, 1946 Today's Date: 07/26/2019    History of present illness Katelyn Lamb is a 73 y.o. female with medical history significant of anxiety, back pain, gait abnormality, memory difficulty, HSV myelitis presenting with a chief complaint of dysuria.  States she was recently diagnosed with a UTI   OT comments  Performed toileting and standing grooming today. Pt needed more assistance for hygiene today; she was leaning posteriorly and had difficulty weight shifting forward despite multiple, different verbal cues and tactile cues.  Pt plans home today.   Follow Up Recommendations  Home health OT;Supervision/Assistance - 24 hour    Equipment Recommendations  None recommended by OT    Recommendations for Other Services      Precautions / Restrictions Precautions Precautions: Fall Restrictions Weight Bearing Restrictions: No       Mobility Bed Mobility           Sit to supine: Supervision   General bed mobility comments: pt was sitting EOB when OT arrived  Transfers   Equipment used: Rolling walker (2 wheeled)   Sit to Stand: Min assist         General transfer comment: cues for UE placement and min A to steady during transition to standing    Balance                                           ADL either performed or assessed with clinical judgement   ADL       Grooming: Oral care;Min guard;Standing                   Toilet Transfer: Minimal assistance;Comfort height toilet;BSC;RW   Toileting- Clothing Manipulation and Hygiene: Maximal assistance;Sit to/from stand         General ADL Comments: Pt needed mod/max A for balance during hygiene; she did not understand multimodal cues to weight shift forward during hygiene. Pt reports that she normally performs front and husband assists with posterior peri care. Pt was able to open toothpaste and  apply it and open lotion and use it.       Vision       Perception     Praxis      Cognition Arousal/Alertness: Awake/alert Behavior During Therapy: WFL for tasks assessed/performed Overall Cognitive Status: History of cognitive impairments - at baseline                                 General Comments: memory problems at baseline.  Pt did lose train of thought at times        Exercises     Shoulder Instructions       General Comments      Pertinent Vitals/ Pain       Pain Assessment: No/denies pain  Home Living                                          Prior Functioning/Environment              Frequency  Min 2X/week        Progress Toward Goals  OT Goals(current goals can now be found in  the care plan section)  Progress towards OT goals: Progressing toward goals(needed more assist for hygiene today)     Plan      Co-evaluation                 AM-PAC OT "6 Clicks" Daily Activity     Outcome Measure   Help from another person eating meals?: A Little Help from another person taking care of personal grooming?: A Little Help from another person toileting, which includes using toliet, bedpan, or urinal?: A Lot Help from another person bathing (including washing, rinsing, drying)?: A Little Help from another person to put on and taking off regular upper body clothing?: A Little Help from another person to put on and taking off regular lower body clothing?: A Little 6 Click Score: 17    End of Session    OT Visit Diagnosis: Unsteadiness on feet (R26.81);Other abnormalities of gait and mobility (R26.89);Other symptoms and signs involving the nervous system (R29.898);Other symptoms and signs involving cognitive function;Muscle weakness (generalized) (M62.81)   Activity Tolerance Patient tolerated treatment well   Patient Left in bed;with call bell/phone within reach;with bed alarm set   Nurse Communication           Time: [id number] OT Time Calculation (min): 27 min  Charges: OT General Charges $OT Visit: 1 Visit OT Treatments $Self Care/Home Management : 23-37 mins  Lesle Chris, OTR/L Acute Rehabilitation Services [phone] [place] pager [phone] office 07/26/2019   [place] 07/26/2019, 10:21 AM

## 2019-07-26 NOTE — Progress Notes (Signed)
PROGRESS NOTE     Katelyn Lamb  [id number] DOB: 03-03-1946 DOA: 07/23/2019 PCP: [name], MD   Brief Narrative: Patient is a 73 year old Caucasian female with past medical history significant for  anxiety, back pain, gait abnormality, memory difficulty and HSV myelitis.  Patient presented with dysuria, and currently on treatment for sepsis/UTI.  Apparently, patient was on antibiotics prior to presentation, therefore, cultures have not grown any organisms.  Patient has severe penicillin allergy, but was on IV Rocephin.  Patient has improved significantly, and leukocytosis has resolved significantly.  However, patient has continued to complain with symptoms involving the fingers/hand whilst on IV Rocephin.  The initial plan was to discharge patient on oral antibiotics and patient reported same with hand/finger symptoms with trial of 1 dose of cefdinir.  Patient will likely stay in the hospital to complete 7-day course of antibiotics.  Patient is currently on IV aztreonam.  Unfortunately, culture results have not grown any organism.  07/25/2019: Patient seen alongside patient's husband.  Patient is eager to be discharged back home.  However, patient has multiple drug allergies.  Tried to oral cefdinir but patient developed significant allergic reaction as per patient's nurse.  Patient reported feeling of the throat closing up.  Patient was treated with IV Solu-Medrol, IV Pepcid and Benadryl.  Cefdinir has been discontinued.  Discussed with the pharmacist, will start patient on IV aztreonam.  Patient may need to remain in the hospital to complete course of antibiotics.  Meanwhile, cultures have not grown any organisms as patient was on antibiotics prior to presentation.  07/26/2019: Patient seen.  No new changes.  Will complete course of IV antibiotics.  No constitutional symptoms reported.  Assessment & Plan:   Principal Problem:   Severe sepsis ([place]) Active Problems:   UTI (urinary tract  infection)   Hematuria   Acute respiratory failure with hypoxia ([facility])   Hypokalemia   Acute on chronic anemia   Fever   Leukocytosis   Sepsis ([facility])  Severe sepsis, likely secondary to UTI: -Febrile, tachycardic, tachypneic.  Did become hypotensive in the ED, blood pressure now improved after 1.5 L IV fluid. WBC count 21.6 with left shift.  Lactic acid normal.  UA with large amount of leukocytes, 11-20 WBCs, and many bacteria. We will DC IV fluids as patient is able to eat and drink now. Patient has tolerated Rocephin well in spite of her penicillin allergy. -Urine culture pending -Blood culture x2 pending -Continue to monitor WBC count 07/25/2019: Urine culture has not grown any organism.  Patient was on antibiotics prior to presentation.  Patient developed allergic reaction to cefdinir.  Will start patient on IV aztreonam.  Patient may remain in the hospital is complete course of antibiotics intravenously. 07/26/2019: Complete course of IV antibiotics.  Patient is currently on IV aztreonam without any complaints.  Hematuria:  Urine amber colored and UA with large amount of RBCs.  Hemoglobin 14.3 on admission, did drop to 10.8 this morning after IV fluid resuscitation. -CT renal stone study nonconclusive due to previous contrast from CTA of chest. However patient's symptoms are better.  Outpatient follow-up with urology if persistent hematuria -Continue to monitor hemoglobin and hematocrit 07/25/2019: Likely related to UTI.  Resolved.  CT renal stone study was not optimal.  Acute hypoxic respiratory failure: -Likely secondary to suspected reactive airway disease. Oxygen saturation 87% on room air, improved with 4 L supplemental oxygen via nasal cannula.  Per ED documentation, patient was wheezing on arrival and received albuterol.  No documented history of asthma or COPD.  CT angiogram negative for PE or pneumonia.  Showing minimal bilateral posterior basilar subsegmental atelectasis.   Currently not wheezing. -DuoNebs every 6 hours -Albuterol nebulizer as needed -Incentive spirometry 07/25/2019: Stable.  Resolved significantly.  Mild hypokalemia Potassium 3.4. -Replete potassium.  Check magnesium level and replete if low.  Continue to monitor BMP. 07/25/2019: Potassium is 4 today. 07/26/2019: Potassium is 4.6 today.    Allergic reaction to cefdinir: -Patient has multiple drug allergies. -Patient has severe allergy to penicillin -Patient was already complaining about ceftriaxone -Trial of sildenafil, as patient developed allergies as well -IV Solu-Medrol 125 Mg given, IV Pepcid 20 mg twice daily ordered, and Benadryl. -Further management depend on hospital course. 07/26/2019: Patient is tolerating IV aztreonam.  Complete course of IV antibiotics.  Consultants:  None  Procedures:  None  Antimicrobials:  -Discontinued IV Rocephin on 07/25/2019 -Tried cefdinir on 07/25/2019, but patient developed allergy -Start IV aztreonam today, 07/25/2019  Subjective: No new complaints No fever chills  Objective: Vitals:   07/25/19 0524 07/25/19 1330 07/25/19 2058 07/26/19 0559  BP: 132/85 107/70 (!) 91/55 96/64  Pulse: 78 62 63 72  Resp: 16 16 16 16   Temp: 98.1 F (36.7 C) 98.8 F (37.1 C) 98.5 F (36.9 C) 97.7 F (36.5 C)  TempSrc: Oral Oral Oral Oral  SpO2: 96% 98% 94% 92%  Weight:      Height:        Intake/Output Summary (Last 24 hours) at 07/26/2019 1030 Last data filed at 07/26/2019 [id number] Gross per 24 hour  Intake 200 ml  Output 1300 ml  Net -1100 ml   Filed Weights   07/23/19 0042  Weight: 68 kg    Examination:  General exam: Appears calm and comfortable  Respiratory system: Clear to auscultation. Respiratory effort normal. Cardiovascular system: S1 & S2 heard Gastrointestinal system: Abdomen is nondistended, soft and nontender. No organomegaly or masses felt. Normal bowel sounds heard. Central nervous system: Alert and oriented.   Patient moves all extremities Extremities: No leg edema  Data Reviewed: I have personally reviewed following labs and imaging studies  CBC: Recent Labs  Lab 07/23/19 0059 07/23/19 0110 07/23/19 0607 07/24/19 0515 07/25/19 0553 07/26/19 0551  WBC 21.6*  --  21.1* 9.4 7.4 4.9  NEUTROABS 19.2*  --   --   --   --   --   HGB 13.2 14.3 10.8* 11.3* 11.3* 11.9*  HCT 41.0 42.0 34.0* 36.8 36.1 37.9  MCV 98.6  --  101.5* 102.5* 101.1* 99.0  PLT 204  --  159 154 251 [id number]   Basic Metabolic Panel: Recent Labs  Lab 07/23/19 0059 07/23/19 0110 07/23/19 0607 07/24/19 0515 07/25/19 0553 07/26/19 0551  NA 136 138 136 142 142 138  K 3.4* 3.3* 3.4* 4.3 4.0 4.6  CL 100 97* 104 111 110 103  CO2 25  --  23 23 22 26   GLUCOSE 141* 143* 140* 97 89 148*  BUN 9 7* 10 8 7* 11  CREATININE 0.81 0.90 0.84 0.73 0.68 0.84  CALCIUM 9.1  --  7.7* 8.7* 9.1 9.1  MG 1.5*  --   --   --   --   --    GFR: Estimated Creatinine Clearance: 55.3 mL/min (by C-G formula based on SCr of 0.84 mg/dL). Liver Function Tests: Recent Labs  Lab 07/23/19 0059  AST 19  ALT 13  ALKPHOS 108  BILITOT 1.1  PROT 7.5  ALBUMIN 3.9  No results for input(s): LIPASE, AMYLASE in the last 168 hours. No results for input(s): AMMONIA in the last 168 hours. Coagulation Profile: Recent Labs  Lab 07/23/19 0059  INR 1.1   Cardiac Enzymes: No results for input(s): CKTOTAL, CKMB, CKMBINDEX, TROPONINI in the last 168 hours. BNP (last 3 results) No results for input(s): PROBNP in the last 8760 hours. HbA1C: No results for input(s): HGBA1C in the last 72 hours. CBG: No results for input(s): GLUCAP in the last 168 hours. Lipid Profile: No results for input(s): CHOL, HDL, LDLCALC, TRIG, CHOLHDL, LDLDIRECT in the last 72 hours. Thyroid Function Tests: No results for input(s): TSH, T4TOTAL, FREET4, T3FREE, THYROIDAB in the last 72 hours. Anemia Panel: Recent Labs    07/25/19 0553  VITAMINB12 1,152*  FOLATE 6.8  FERRITIN 23   TIBC 352  IRON 40  RETICCTPCT 1.5   Sepsis Labs: Recent Labs  Lab 07/23/19 0059  LATICACIDVEN 1.3    Recent Results (from the past 240 hour(s))  SARS CORONAVIRUS 2 (TAT 6-24 HRS) Nasopharyngeal Nasopharyngeal Swab     Status: None   Collection Time: 07/23/19 12:59 AM   Specimen: Nasopharyngeal Swab  Result Value Ref Range Status   SARS Coronavirus 2 NEGATIVE NEGATIVE Final    Comment: (NOTE) SARS-CoV-2 target nucleic acids are NOT DETECTED. The SARS-CoV-2 RNA is generally detectable in upper and lower respiratory specimens during the acute phase of infection. Negative results do not preclude SARS-CoV-2 infection, do not rule out co-infections with other pathogens, and should not be used as the sole basis for treatment or other patient management decisions. Negative results must be combined with clinical observations, patient history, and epidemiological information. The expected result is Negative. Fact Sheet for Patients: SugarRoll.be Fact Sheet for Healthcare Providers: https://www.woods-mathews.com/ This test is not yet approved or cleared by the [place] FDA and  has been authorized for detection and/or diagnosis of SARS-CoV-2 by FDA under an Emergency Use Authorization (EUA). This EUA will remain  in effect (meaning this test can be used) for the duration of the COVID-19 declaration under Section 56 4(b)(1) of the Act, 21 U.S.C. section 360bbb-3(b)(1), unless the authorization is terminated or revoked sooner. Performed at [facility], [place] [address], [place], [place] [address]   Urine culture     Status: None   Collection Time: 07/23/19 12:59 AM   Specimen: In/Out Cath Urine  Result Value Ref Range Status   Specimen Description   Final    IN/OUT CATH URINE Performed at [facility] [address], [place], [place] [address]    Special Requests   Final    NONE Performed at [facility], [place] [address], [place], [place] [address]    Culture   Final    NO GROWTH Performed at [facility], [place] [address], [place], [place] [address]    Report Status 07/23/2019 FINAL  Final  Blood Culture (routine x 2)     Status: None (Preliminary result)   Collection Time: 07/23/19  1:00 AM   Specimen: BLOOD RIGHT ARM  Result Value Ref Range Status   Specimen Description   Final    BLOOD RIGHT ARM Performed at [facility] [address], [place], [place] [address]    Special Requests   Final    BOTTLES DRAWN AEROBIC AND ANAEROBIC Blood Culture adequate volume Performed at [facility] [address], [place], [place] [address]    Culture  Final    NO GROWTH 3 DAYS Performed at [facility], [place] [address], [place], [place] [address]    Report Status PENDING  Incomplete  MRSA PCR Screening     Status: None   Collection Time: 07/23/19  9:50 PM   Specimen: Nasal Mucosa; Nasopharyngeal  Result Value Ref Range Status   MRSA by PCR NEGATIVE NEGATIVE Final    Comment:        The GeneXpert MRSA Assay (FDA approved for NASAL specimens only), is one component of a comprehensive MRSA colonization surveillance program. It is not intended to diagnose MRSA infection nor to guide or monitor treatment for MRSA infections. Performed at [facility], [place] [address], [place], [place] [address]   Blood Culture (routine x 2)     Status: None (Preliminary result)   Collection Time: 07/24/19  5:15 AM   Specimen: BLOOD RIGHT HAND  Result Value Ref Range Status   Specimen Description   Final    BLOOD RIGHT HAND Performed at [facility], [place] [address] [place], [place] [address]    Special Requests   Final    BOTTLES DRAWN AEROBIC ONLY Blood Culture adequate volume Performed at [facility] [address], [place], [place] [address]    Culture   Final    NO GROWTH 2 DAYS  Performed at [facility] [address], [place], [place] [address]    Report Status PENDING  Incomplete         Radiology Studies: No results found.      Scheduled Meds: . acyclovir  400 mg Oral BID  . Chlorhexidine Gluconate Cloth  6 each Topical Daily  . diazepam  5 mg Oral BID  . diphenhydrAMINE  25 mg Oral Q8H  . DULoxetine  30 mg Oral Daily  . DULoxetine  60 mg Oral QHS  . mouth rinse  15 mL Mouth Rinse BID  . omega-3 acid ethyl esters  1 g Oral BID  . pantoprazole  40 mg Oral Daily  . polyethylene glycol  17 g Oral Daily  . pregabalin  100 mg Oral QHS   Continuous Infusions: . aztreonam 2 g (07/26/19 0626)  . famotidine (PEPCID) IV 20 mg (07/26/19 0959)     LOS: 3 days    Time spent:     Bonnell Public, MD [facility] Pager [phone]  If 7PM-7AM, please contact night-coverage [email or web address] Password [email or web address] 07/26/2019, 10:30 AM

## 2019-07-27 DIAGNOSIS — R3129 Other microscopic hematuria: Secondary | ICD-10-CM

## 2019-07-27 DIAGNOSIS — J9601 Acute respiratory failure with hypoxia: Secondary | ICD-10-CM

## 2019-07-27 DIAGNOSIS — E876 Hypokalemia: Secondary | ICD-10-CM

## 2019-07-27 NOTE — Progress Notes (Signed)
PROGRESS NOTE    Katelyn Lamb  [id number]  DOB: 1946-03-27  DOA: 07/23/2019 PCP: [name], MD  Brief Narrative:  73 y.o.femalewith medical history significant ofanxiety, back pain, gait abnormality, memory difficulty, HSV myelitis presenting witha chief complaint of dysuria. States she was recently diagnosed with a UTI and prescribed an antibiotic and took 4 doses PTA. ED course: Patient was febrile in ER. White cell count 21.6. Abnormal U/A noted.She was given a dose of vancomycin, aztreonam and metronidazole in ER. Per ED documentation, patient was wheezing on arrival and received albuterol.Oxygen saturation 87% on room air, improved with 4 L supplemental oxygen via nasal cannula. No documented history of asthma or COPD. CT angiogram negative for PE or pneumonia. Showing minimal bilateral posterior basilar subsegmental atelectasis Hospital course: She was admitted to [facility] service and started on IV Rocephin.Urine and blood cultures show no growth so far. On 11/21-Triedto oral cefdinir but patient developed significant allergic reaction as per patient's nurse. Patient reported feeling of the throat closing up. Patient was treated with IV Solu-Medrol, IV Pepcid and Benadryl. Cefdinir has been discontinued. Discussed with the pharmacist, will start patient on IV aztreonam. Patient may need to remain in the hospital to complete course of antibiotics. O2 saturation is improved now and stable. Patient underwent CT renal for microscopic hematuria with no significant findings.  Seen by PT who recommended [facility] PT/OT on discharge.  Subjective: Patient sitting comfortably in bedside chair.  Reports chronic neuropathy in hands and legs.  Was worked up extensively at [facility] with no definitive diagnosis.  Denies any skin rash or itching with IV aztreonam.  She is asking if she could go home for Thanksgiving.  Objective: Vitals:   07/26/19 1952 07/27/19 0600 07/27/19 1417 07/27/19 1600    BP: 103/69 105/83 114/70   Pulse: 84 (!) 58 65   Resp: 18 17 18    Temp: 98.4 F (36.9 C) 97.6 F (36.4 C) 97.6 F (36.4 C)   TempSrc:  Oral Oral   SpO2: 94% 95% 92% 93%  Weight:      Height:       No intake or output data in the 24 hours ending 07/27/19 1721 Filed Weights   07/23/19 0042  Weight: 68 kg    Physical Examination:  General exam: Appears calm and comfortable  Respiratory system: Clear to auscultation. Respiratory effort normal. Cardiovascular system: S1 & S2 heard, RRR. No JVD, murmurs, rubs, gallops or clicks. No pedal edema. Gastrointestinal system: Abdomen is nondistended, soft and nontender. No organomegaly or masses felt. Normal bowel sounds heard. Central nervous system: Alert and oriented. No new focal neurological deficits. Extremities: No contractures, edema.  She does have chronic joint deformities in both hands. Skin: No rashes, lesions or ulcers Psychiatry: Judgement and insight appear normal. Mood & affect appropriate.   Data Reviewed: I have personally reviewed following labs and imaging studies  CBC: Recent Labs  Lab 07/23/19 0059 07/23/19 0110 07/23/19 0607 07/24/19 0515 07/25/19 0553 07/26/19 0551  WBC 21.6*  --  21.1* 9.4 7.4 4.9  NEUTROABS 19.2*  --   --   --   --   --   HGB 13.2 14.3 10.8* 11.3* 11.3* 11.9*  HCT 41.0 42.0 34.0* 36.8 36.1 37.9  MCV 98.6  --  101.5* 102.5* 101.1* 99.0  PLT 204  --  159 154 251 [id number]   Basic Metabolic Panel: Recent Labs  Lab 07/23/19 0059 07/23/19 0110 07/23/19 0607 07/24/19 0515 07/25/19 0553 07/26/19 0551  NA 136  138 136 142 142 138  K 3.4* 3.3* 3.4* 4.3 4.0 4.6  CL 100 97* 104 111 110 103  CO2 25  --  23 23 22 26   GLUCOSE 141* 143* 140* 97 89 148*  BUN 9 7* 10 8 7* 11  CREATININE 0.81 0.90 0.84 0.73 0.68 0.84  CALCIUM 9.1  --  7.7* 8.7* 9.1 9.1  MG 1.5*  --   --   --   --   --    GFR: Estimated Creatinine Clearance: 55.3 mL/min (by C-G formula based on SCr of 0.84 mg/dL). Liver  Function Tests: Recent Labs  Lab 07/23/19 0059  AST 19  ALT 13  ALKPHOS 108  BILITOT 1.1  PROT 7.5  ALBUMIN 3.9   No results for input(s): LIPASE, AMYLASE in the last 168 hours. No results for input(s): AMMONIA in the last 168 hours. Coagulation Profile: Recent Labs  Lab 07/23/19 0059  INR 1.1   Cardiac Enzymes: No results for input(s): CKTOTAL, CKMB, CKMBINDEX, TROPONINI in the last 168 hours. BNP (last 3 results) No results for input(s): PROBNP in the last 8760 hours. HbA1C: No results for input(s): HGBA1C in the last 72 hours. CBG: No results for input(s): GLUCAP in the last 168 hours. Lipid Profile: No results for input(s): CHOL, HDL, LDLCALC, TRIG, CHOLHDL, LDLDIRECT in the last 72 hours. Thyroid Function Tests: No results for input(s): TSH, T4TOTAL, FREET4, T3FREE, THYROIDAB in the last 72 hours. Anemia Panel: Recent Labs    07/25/19 0553  VITAMINB12 1,152*  FOLATE 6.8  FERRITIN 23  TIBC 352  IRON 40  RETICCTPCT 1.5   Sepsis Labs: Recent Labs  Lab 07/23/19 0059  LATICACIDVEN 1.3    Recent Results (from the past 240 hour(s))  SARS CORONAVIRUS 2 (TAT 6-24 HRS) Nasopharyngeal Nasopharyngeal Swab     Status: None   Collection Time: 07/23/19 12:59 AM   Specimen: Nasopharyngeal Swab  Result Value Ref Range Status   SARS Coronavirus 2 NEGATIVE NEGATIVE Final    Comment: (NOTE) SARS-CoV-2 target nucleic acids are NOT DETECTED. The SARS-CoV-2 RNA is generally detectable in upper and lower respiratory specimens during the acute phase of infection. Negative results do not preclude SARS-CoV-2 infection, do not rule out co-infections with other pathogens, and should not be used as the sole basis for treatment or other patient management decisions. Negative results must be combined with clinical observations, patient history, and epidemiological information. The expected result is Negative. Fact Sheet for  Patients: SugarRoll.be Fact Sheet for Healthcare Providers: https://www.woods-mathews.com/ This test is not yet approved or cleared by the [place] FDA and  has been authorized for detection and/or diagnosis of SARS-CoV-2 by FDA under an Emergency Use Authorization (EUA). This EUA will remain  in effect (meaning this test can be used) for the duration of the COVID-19 declaration under Section 56 4(b)(1) of the Act, 21 U.S.C. section 360bbb-3(b)(1), unless the authorization is terminated or revoked sooner. Performed at [facility], [place] [address], [place], [place] [address]   Urine culture     Status: None   Collection Time: 07/23/19 12:59 AM   Specimen: In/Out Cath Urine  Result Value Ref Range Status   Specimen Description   Final    IN/OUT CATH URINE Performed at [facility] [address] [place], [place] [address]    Special Requests   Final    NONE Performed at [facility], [place] [address], [place], [place] [address]    Culture  Final    NO GROWTH Performed at [facility], [place] [address], [place], [place] [address]    Report Status 07/23/2019 FINAL  Final  Blood Culture (routine x 2)     Status: None (Preliminary result)   Collection Time: 07/23/19  1:00 AM   Specimen: BLOOD RIGHT ARM  Result Value Ref Range Status   Specimen Description   Final    BLOOD RIGHT ARM Performed at [facility] [address], [place], [place] [address]    Special Requests   Final    BOTTLES DRAWN AEROBIC AND ANAEROBIC Blood Culture adequate volume Performed at [facility] [address] [place], [place] [address]    Culture   Final    NO GROWTH 4 DAYS Performed at [facility], [place] [address], [place], [place] [address]    Report Status PENDING  Incomplete  MRSA PCR Screening     Status: None   Collection Time: 07/23/19  9:50 PM    Specimen: Nasal Mucosa; Nasopharyngeal  Result Value Ref Range Status   MRSA by PCR NEGATIVE NEGATIVE Final    Comment:        The GeneXpert MRSA Assay (FDA approved for NASAL specimens only), is one component of a comprehensive MRSA colonization surveillance program. It is not intended to diagnose MRSA infection nor to guide or monitor treatment for MRSA infections. Performed at [facility], [place] [address] [place], [place] [address]   Blood Culture (routine x 2)     Status: None (Preliminary result)   Collection Time: 07/24/19  5:15 AM   Specimen: BLOOD RIGHT HAND  Result Value Ref Range Status   Specimen Description   Final    BLOOD RIGHT HAND Performed at [facility], [place] [address], [place], [place] [address]    Special Requests   Final    BOTTLES DRAWN AEROBIC ONLY Blood Culture adequate volume Performed at [facility] [address], [place], [place] [address]    Culture   Final    NO GROWTH 3 DAYS Performed at [facility], [place] [address], [place], [place] [address]    Report Status PENDING  Incomplete      Radiology Studies: No results found.      Scheduled Meds:  acyclovir  400 mg Oral BID   Chlorhexidine Gluconate Cloth  6 each Topical Daily   diazepam  5 mg Oral BID   diphenhydrAMINE  25 mg Oral Q8H   DULoxetine  30 mg Oral Daily   DULoxetine  60 mg Oral QHS   mouth rinse  15 mL Mouth Rinse BID   omega-3 acid ethyl esters  1 g Oral BID   pantoprazole  40 mg Oral Daily   polyethylene glycol  17 g Oral Daily   pregabalin  100 mg Oral QHS   Continuous Infusions:  aztreonam 2 g (07/27/19 1338)   famotidine (PEPCID) IV 20 mg (07/27/19 1146)    Assessment & Plan:   Sepsis syndrome POA presumed due to UTI: Presented with fever tachycardia and tachypnea.  She also had significant leukocytosis of 20 1.6K although lactate was normal.  Did not tolerate cephalosporins, reports prior  penicillin allergy with rash.  She reports itching in both her hands this time.  Currently on IV aztreonam (since 11/21) and tolerating well.  No growth reported on urine culture from 11/19.  Blood cultures negative as well.  Will discuss regarding  stop date for antibiotic treatment with pharmacy in a.m.  Transient hypoxic respiratory failure: Present on admission.  Could be secondary to reactive airway disease versus atelectasis.Oxygen saturation 87% on room air, improved with 4 L supplemental oxygen via nasal cannula.Per ED documentation, patient was wheezing on arrival and received albuterol. No documented history of asthma or COPD. CT angiogram negative for PE or pneumonia. Showing minimal bilateral posterior basilar subsegmental atelectasis. Currently not wheezing.  Saturating well on room air  Microscopic hematuria: CT renal did not show any significant nephrolithiasis but largely inconclusive due to extensive contrast within the renal collecting systems,ureters, and bladder which limits assessment for potential smaller calculi within the bladder.  Repeat UA as outpatient.  History of HSV myelitis: Patient on chronic acyclovir  Chronic back pain/peripheral neuropathy: CT renal did report compression fractures and degenerative disc disease.  Resume pregabalin.  Allergic reaction to antibiotic: Patient has documented penicillin allergy.  Had itching in both her hands with cephalosporins.  There is report of breathing difficulty but patient does not recollect this.  Tolerating IV aztreonam well.  Depression/anxiety: On diazepam/duloxetine  DVT prophylaxis: Ambulating and out of bed to chair Code Status: Full code Family / Patient Communication: Discussed with patient Disposition Plan: Home when medically cleared     LOS: 4 days    Time spent: 25 minutes    Guilford Shi, MD [facility] Pager [phone]  If 7PM-7AM, please contact  night-coverage [email or web address] Password [facility] 07/27/2019, 5:21 PM

## 2019-07-27 NOTE — Progress Notes (Signed)
Physical Therapy Treatment Patient Details Name: Katelyn Lamb MRN: [id number] DOB: [birth year] Today's Date: 07/27/2019    History of Present Illness Katelyn Lamb is a 73 y.o. female with medical history significant of anxiety, back pain, gait abnormality, memory difficulty, HSV myelitis presenting with a chief complaint of dysuria.  States she was recently diagnosed with a UTI    PT Comments    Pt progressing, incr gait distance today but continues to require assist for balance/safety during gait. Continue to recommend 24hr assist and HHPT    Follow Up Recommendations  Home health PT;Supervision/Assistance - 24 hour     Equipment Recommendations  None recommended by PT    Recommendations for Other Services       Precautions / Restrictions Precautions Precautions: Fall Restrictions Weight Bearing Restrictions: No    Mobility  Bed Mobility Overal bed mobility: Needs Assistance Bed Mobility: Supine to Sit     Supine to sit: Min guard     General bed mobility comments: incr time and cues to self assist, no rails, bed flat  Transfers Overall transfer level: Needs assistance Equipment used: Rolling walker (2 wheeled) Transfers: Sit to/from Stand Sit to Stand: Min assist         General transfer comment: cues for UE placement and min A to steady during transition to standing from bed. pt stood with supervision from toilet  Ambulation/Gait Ambulation/Gait assistance: Min assist Gait Distance (Feet): 80 Feet(10) Assistive device: Rolling walker (2 wheeled) Gait Pattern/deviations: Step-through pattern;Drifts right/left;Decreased stride length Gait velocity: decr   General Gait Details: assist for balance and maneuvering RW   Stairs             Wheelchair Mobility    Modified Rankin (Stroke Patients Only)       Balance Overall balance assessment: Needs assistance Sitting-balance support: Bilateral upper extremity supported;Feet supported Sitting  balance-Leahy Scale: Fair Sitting balance - Comments: difficulty wt shifting, no overt LOB noted    Standing balance support: During functional activity;Bilateral upper extremity supported Standing balance-Leahy Scale: Fair Standing balance comment: briefly able to maintain static standing without support, min assist for wt shifting safely without UE support                             Cognition Arousal/Alertness: Awake/alert Behavior During Therapy: WFL for tasks assessed/performed Overall Cognitive Status: History of cognitive impairments - at baseline                                 General Comments: repeats and constradicts self during session/memory deficits at baseline      Exercises      General Comments        Pertinent Vitals/Pain Pain Assessment: No/denies pain    Home Living                      Prior Function            PT Goals (current goals can now be found in the care plan section) Acute Rehab PT Goals Patient Stated Goal: go home today PT Goal Formulation: With patient Time For Goal Achievement: 08/08/19 Potential to Achieve Goals: Good Progress towards PT goals: Progressing toward goals    Frequency    Min 3X/week      PT Plan Current plan remains appropriate    Co-evaluation  AM-PAC PT "6 Clicks" Mobility   Outcome Measure  Help needed turning from your back to your side while in a flat bed without using bedrails?: A Little Help needed moving from lying on your back to sitting on the side of a flat bed without using bedrails?: A Little Help needed moving to and from a bed to a chair (including a wheelchair)?: A Little Help needed standing up from a chair using your arms (e.g., wheelchair or bedside chair)?: A Little Help needed to walk in hospital room?: A Little Help needed climbing 3-5 steps with a railing? : A Little 6 Click Score: 18    End of Session Equipment Utilized During  Treatment: Gait belt Activity Tolerance: Patient tolerated treatment well Patient left: in chair;with call bell/phone within reach;with nursing/sitter in room;with chair alarm set   PT Visit Diagnosis: Unsteadiness on feet (R26.81);Other symptoms and signs involving the nervous system (R29.898)     Time: [id number] PT Time Calculation (min) (ACUTE ONLY): 16 min  Charges:  $Gait Training: 8-22 mins                     Kenyon Ana, PT  Pager: [phone] Acute Rehab Dept [facility]): [phone]   07/27/2019 \   Kort Stettler 07/27/2019, 1:47 PM

## 2019-07-27 NOTE — Care Management Important Message (Signed)
Important Message  Patient Details IM Letter given to [facility] SW to present to the Patient Name: Katelyn Lamb MRN: [id number] Date of Birth: Apr 02, 1946   Medicare Important Message Given:  Yes     [name] 07/27/2019, 10:33 AM

## 2019-07-28 DIAGNOSIS — D649 Anemia, unspecified: Secondary | ICD-10-CM

## 2019-07-28 DIAGNOSIS — R5081 Fever presenting with conditions classified elsewhere: Secondary | ICD-10-CM

## 2019-07-28 LAB — CULTURE, BLOOD (ROUTINE X 2)
Culture: NO GROWTH
Special Requests: ADEQUATE

## 2019-07-28 NOTE — Progress Notes (Signed)
Pt is being discharged to home. Discharge instructions including follow up appointments and medications given. Pt had no further questions at this time.

## 2019-07-28 NOTE — Discharge Summary (Signed)
Physician Discharge Summary  Katelyn Lamb [id number] DOB: [birth year] DOA: 07/23/2019  PCP: [name], MD  Admit date: 07/23/2019 Discharge date: 07/28/2019 Consultations:  Admitted From:  Disposition:   Discharge Diagnoses:  Principal Problem:   Severe sepsis ([place]) Active Problems:   UTI (urinary tract infection)   Hematuria   Acute respiratory failure with hypoxia (HCC)   Hypokalemia   Acute on chronic anemia   Fever   Leukocytosis   Sepsis [facility])   Hospital Course Summary: 73 y.o.femalewith medical history significant ofanxiety, back pain, gait abnormality, memory difficulty, HSV myelitis presenting witha chief complaint of dysuria. States she was recently diagnosed with a UTI and prescribed an antibioticand took4 doses PTA. ED course:Patient was febrile in ER. White cell count 21.6.Abnormal U/A noted.She was given a dose of vancomycin, aztreonam and metronidazole in ER.Per ED documentation, patient was wheezing on arrival and received albuterol.Oxygen saturation 87% on room air, improved with 4 L supplemental oxygen via nasal cannula. No documented history of asthma or COPD. CT angiogram negative for PE or pneumonia. Showing minimal bilateral posterior basilar subsegmental atelectasis Hospital course:[facility] andstarted on IV Rocephin.Urine and blood cultures show no growth so far. On 11/21-Triedto oral cefdinir but patient developed significant allergic reaction as per patient's nurse. Patient reported feeling of the throat closing up. Patient was treated with IV Solu-Medrol, IV Pepcid and Benadryl. Cefdinir has been discontinued.  Hospitalistdiscussed with the pharmacist and subsequently started patient on IV aztreonam. Patient may need to remain in the hospital to complete course of antibiotics.O2 saturation is improved now and stable. Patient underwent CT renal for microscopic hematuria with no significant findings.Seen by PT  who recommended [facility] PT/OT on discharge.  Sepsis syndrome POA presumed due to UTI: Presented with fever, tachycardia and tachypnea.  She also had significant leukocytosis of 21.6K although lactate was normal.  Did not tolerate cephalosporins, reports prior penicillin allergy with rash.  She reports itching in both her hands this time.    As mentioned above she was transitioned to IV aztreonam (received 11/21-11/24) and tolerated well.  Surprisingly, no growth reported on urine culture from 11/19, although to bear in mind this urine sample was sent after patient received 3 days of outpatient antibiotics and possibly IV antibiotic in the ED.  Blood cultures negative as well.  Patient anxious to go home for Thanksgiving.  Discussed with clinical pharmacy who advised that patient did receive total of 6 days of IV antibiotics which should be enough to cover UTI.   Leukocytosis resolved on CBC within 24 hours of admission.  Not sure if initial leukocytosis could have been reactive.  She had temp of 101.50F on presentation but she has remained afebrile during rest of the hospital course. Patient reports chronic issues with urinary incontinence for which she was seeing [facility] in the past.  Recommended follow-up with them upon discharge.  Transient hypoxic respiratory failure: Present on admission.  Could be secondary to reactive airway disease versus atelectasis.Oxygen saturation 87% on room air, improved with 4 L supplemental oxygen via nasal cannula.Per ED documentation, patient was wheezing on arrival and received albuterol. No documented history of asthma or COPD. CT angiogram negative for PE or pneumonia. Showing minimal bilateral posterior basilar subsegmental atelectasis. Currently not wheezing.  Saturating well on room air  Microscopic hematuria: CT renal did not show any significant nephrolithiasis but largely inconclusive due to extensive contrast within the renal collecting systems,ureters,  and bladder which limits assessment for potential smaller calculi  within the bladder.  Repeat UA and follow-up [facility] as outpatient   History of HSV myelitis: Patient on chronic acyclovir  Chronic back pain/peripheral neuropathy: CT renal did report compression fractures and degenerative disc disease.  Resume pregabalin.  Allergic reaction to antibiotic: Patient has documented penicillin allergy.  Had itching in both her hands with cephalosporins.  There is report of breathing difficulty but patient does not recollect this.  Tolerating IV aztreonam well.  Depression/anxiety: On diazepam/duloxetine    Discharge Exam:  Vitals:   07/28/19 0528 07/28/19 1349  BP: 107/73 107/73  Pulse: 62 68  Resp: 16 18  Temp: 97.7 F (36.5 C) 98.8 F (37.1 C)  SpO2: 94% 100%   Vitals:   07/27/19 1600 07/27/19 2020 07/28/19 0528 07/28/19 1349  BP:  109/80 107/73 107/73  Pulse:  77 62 68  Resp:  17 16 18   Temp:  97.8 F (36.6 C) 97.7 F (36.5 C) 98.8 F (37.1 C)  TempSrc:  Oral Oral   SpO2: 93% 98% 94% 100%  Weight:      Height:        General: Pt is alert, awake, not in acute distress Cardiovascular: RRR, S1/S2 +, no rubs, no gallops Respiratory: CTA bilaterally, no wheezing, no rhonchi Abdominal: Soft, NT, ND, bowel sounds + Extremities: no edema, no cyanosis.  Chronic joint deformities in bilateral hands/digits  Discharge Condition:Stable CODE STATUS: Full code Diet recommendation: Heart healthy diet Recommendations for Outpatient Follow-up:  1. Follow up with PCP: 1 week 2. Follow up with consultants: Urology in 2 weeks 3. Please obtain follow up labs including: Repeat UA, CBC  Home Health services upon discharge: Home health PT/OT Equipment/Devices upon discharge: None   Discharge Instructions:  Discharge Instructions    Call MD for:  persistant dizziness or light-headedness   Complete by: As directed    Call MD for:  persistant nausea and vomiting    Complete by: As directed    Call MD for:  severe uncontrolled pain   Complete by: As directed    Call MD for:  temperature >100.4   Complete by: As directed    Diet - low sodium heart healthy   Complete by: As directed    For home use only DME Nebulizer machine   Complete by: As directed    Patient needs a nebulizer to treat with the following condition: Wheezing   Length of Need: 6 Months   Increase activity slowly   Complete by: As directed    Activity as per PT/OT   Increase activity slowly   Complete by: As directed      Allergies as of 07/28/2019      Reactions   Demerol [meperidine] Other (See Comments)   Hallucinations   Percocet [oxycodone-acetaminophen] Itching   Amoxicillin-pot Clavulanate Diarrhea   Severe pain, headache, intestinal infection   Penicillins Itching, Rash   Has patient had a PCN reaction causing immediate rash, facial/tongue/throat swelling, SOB or lightheadedness with hypotension:  NO Has patient had a PCN reaction causing severe rash involving mucus membranes or skin necrosis: No Has patient had a PCN reaction that required hospitalization: No Has patient had a PCN reaction occurring within the last 10 years: Yes If all of the above answers are "NO", then may proceed with Cephalosporin use.      Medication List    STOP taking these medications   nitrofurantoin (macrocrystal-monohydrate) 100 MG capsule Commonly known as: MACROBID   omega-3 acid ethyl esters 1 g capsule  Commonly known as: LOVAZA     TAKE these medications   acyclovir 400 MG tablet Commonly known as: ZOVIRAX TAKE 1 TABLET BY MOUTH TWICE A DAY   conjugated estrogens vaginal cream Commonly known as: PREMARIN Place 1 Applicatorful vaginally daily as needed (itching).   diazepam 5 MG tablet Commonly known as: VALIUM Take 5 mg by mouth 2 (two) times daily.   diphenoxylate-atropine 2.5-0.025 MG tablet Commonly known as: LOMOTIL Take 1 tablet by mouth 4 (four) times daily  as needed for diarrhea or loose stools.   DULoxetine 60 MG capsule Commonly known as: CYMBALTA Take 1 capsule (60 mg total) by mouth at bedtime.   DULoxetine 30 MG capsule Commonly known as: Cymbalta Take 1 capsule (30 mg total) by mouth daily.   HYDROcodone-acetaminophen 10-325 MG tablet Commonly known as: NORCO Take 1 tablet by mouth 3 (three) times daily as needed for severe pain.   ipratropium-albuterol 0.5-2.5 (3) MG/3ML Soln Commonly known as: DUONEB Take 3 mLs by nebulization every 4 (four) hours as needed (For SOB and wheezing).   pantoprazole 40 MG tablet Commonly known as: PROTONIX Take 1 tablet (40 mg total) by mouth daily.   polyethylene glycol 17 g packet Commonly known as: MIRALAX / GLYCOLAX Take 17 g by mouth daily.   pregabalin 50 MG capsule Commonly known as: LYRICA Take 2 capsules (100 mg total) by mouth at bedtime. TAKE 1 CAPSULE BY MOUTH IN THE MORNING AND TAKE 2 CAPSULES BY MOUTH IN THE EVENING. What changed: additional instructions            Durable Medical Equipment  (From admission, onward)         Start     Ordered   07/25/19 0000  For home use only DME Nebulizer machine    Question Answer Comment  Patient needs a nebulizer to treat with the following condition Wheezing   Length of Need 6 Months      07/25/19 [place], [place] Follow up.   Why: Follow up for Physical and Occupationla Therapy  Contact information: [name] [address] [phone]          Allergies  Allergen Reactions  . Demerol [Meperidine] Other (See Comments)    Hallucinations  . Percocet [Oxycodone-Acetaminophen] Itching  . Amoxicillin-Pot Clavulanate Diarrhea    Severe pain, headache, intestinal infection  . Penicillins Itching and Rash    Has patient had a PCN reaction causing immediate rash, facial/tongue/throat swelling, SOB or lightheadedness with hypotension:   NO Has patient had a PCN reaction causing severe rash involving mucus membranes or skin necrosis: No Has patient had a PCN reaction that required hospitalization: No Has patient had a PCN reaction occurring within the last 10 years: Yes If all of the above answers are "NO", then may proceed with Cephalosporin use.      The results of significant diagnostics from this hospitalization (including imaging, microbiology, ancillary and laboratory) are listed below for reference.    Labs: BNP (last 3 results) No results for input(s): BNP in the last 8760 hours. Basic Metabolic Panel: Recent Labs  Lab 07/23/19 0059 07/23/19 0110 07/23/19 0607 07/24/19 0515 07/25/19 0553 07/26/19 0551  NA 136 138 136 142 142 138  K 3.4* 3.3* 3.4* 4.3 4.0 4.6  CL 100 97* 104 111 110 103  CO2 25  --  23 23 22 26   GLUCOSE 141*  143* 140* 97 89 148*  BUN 9 7* 10 8 7* 11  CREATININE 0.81 0.90 0.84 0.73 0.68 0.84  CALCIUM 9.1  --  7.7* 8.7* 9.1 9.1  MG 1.5*  --   --   --   --   --    Liver Function Tests: Recent Labs  Lab 07/23/19 0059  AST 19  ALT 13  ALKPHOS 108  BILITOT 1.1  PROT 7.5  ALBUMIN 3.9   No results for input(s): LIPASE, AMYLASE in the last 168 hours. No results for input(s): AMMONIA in the last 168 hours. CBC: Recent Labs  Lab 07/23/19 0059 07/23/19 0110 07/23/19 0607 07/24/19 0515 07/25/19 0553 07/26/19 0551  WBC 21.6*  --  21.1* 9.4 7.4 4.9  NEUTROABS 19.2*  --   --   --   --   --   HGB 13.2 14.3 10.8* 11.3* 11.3* 11.9*  HCT 41.0 42.0 34.0* 36.8 36.1 37.9  MCV 98.6  --  101.5* 102.5* 101.1* 99.0  PLT 204  --  159 154 251 218   Cardiac Enzymes: No results for input(s): CKTOTAL, CKMB, CKMBINDEX, TROPONINI in the last 168 hours. BNP: Invalid input(s): POCBNP CBG: No results for input(s): GLUCAP in the last 168 hours. D-Dimer No results for input(s): DDIMER in the last 72 hours. Hgb A1c No results for input(s): HGBA1C in the last 72 hours. Lipid Profile No results  for input(s): CHOL, HDL, LDLCALC, TRIG, CHOLHDL, LDLDIRECT in the last 72 hours. Thyroid function studies No results for input(s): TSH, T4TOTAL, T3FREE, THYROIDAB in the last 72 hours.  Invalid input(s): FREET3 Anemia work up No results for input(s): VITAMINB12, FOLATE, FERRITIN, TIBC, IRON, RETICCTPCT in the last 72 hours. Urinalysis    Component Value Date/Time   COLORURINE AMBER (A) 07/23/2019 0059   APPEARANCEUR CLOUDY (A) 07/23/2019 0059   LABSPEC 1.011 07/23/2019 0059   PHURINE 5.0 07/23/2019 0059   GLUCOSEU NEGATIVE 07/23/2019 0059   HGBUR LARGE (A) 07/23/2019 0059   BILIRUBINUR NEGATIVE 07/23/2019 [place] 07/23/2019 0059   PROTEINUR 30 (A) 07/23/2019 0059   NITRITE NEGATIVE 07/23/2019 0059   LEUKOCYTESUR LARGE (A) 07/23/2019 0059   Sepsis Labs Invalid input(s): PROCALCITONIN,  WBC,  LACTICIDVEN Microbiology Recent Results (from the past 240 hour(s))  SARS CORONAVIRUS 2 (TAT 6-24 HRS) Nasopharyngeal Nasopharyngeal Swab     Status: None   Collection Time: 07/23/19 12:59 AM   Specimen: Nasopharyngeal Swab  Result Value Ref Range Status   SARS Coronavirus 2 NEGATIVE NEGATIVE Final    Comment: (NOTE) SARS-CoV-2 target nucleic acids are NOT DETECTED. The SARS-CoV-2 RNA is generally detectable in upper and lower respiratory specimens during the acute phase of infection. Negative results do not preclude SARS-CoV-2 infection, do not rule out co-infections with other pathogens, and should not be used as the sole basis for treatment or other patient management decisions. Negative results must be combined with clinical observations, patient history, and epidemiological information. The expected result is Negative. Fact Sheet for Patients: SugarRoll.be Fact Sheet for Healthcare Providers: https://www.woods-mathews.com/ This test is not yet approved or cleared by the [place] FDA and  has been authorized for  detection and/or diagnosis of SARS-CoV-2 by FDA under an Emergency Use Authorization (EUA). This EUA will remain  in effect (meaning this test can be used) for the duration of the COVID-19 declaration under Section 56 4(b)(1) of the Act, 21 U.S.C. section 360bbb-3(b)(1), unless the authorization is terminated or revoked sooner. Performed at [facility],  [address], [place], [place] [address]   Urine culture     Status: None   Collection Time: 07/23/19 12:59 AM   Specimen: In/Out Cath Urine  Result Value Ref Range Status   Specimen Description   Final    IN/OUT CATH URINE Performed at [facility] [address], [place], [place] [address]    Special Requests   Final    NONE Performed at [facility], [place] [address] [place], [place] [address]    Culture   Final    NO GROWTH Performed at [facility], [place] [address] [place], [place] [address]    Report Status 07/23/2019 FINAL  Final  Blood Culture (routine x 2)     Status: None   Collection Time: 07/23/19  1:00 AM   Specimen: BLOOD RIGHT ARM  Result Value Ref Range Status   Specimen Description   Final    BLOOD RIGHT ARM Performed at [facility] [address], [place], [place] [address]    Special Requests   Final    BOTTLES DRAWN AEROBIC AND ANAEROBIC Blood Culture adequate volume Performed at [facility] [address] [place], [place] [address]    Culture   Final    NO GROWTH 5 DAYS Performed at [facility], [place] [address] [place], [place] [address]    Report Status 07/28/2019 FINAL  Final  MRSA PCR Screening     Status: None   Collection Time: 07/23/19  9:50 PM   Specimen: Nasal Mucosa; Nasopharyngeal  Result Value Ref Range Status   MRSA by PCR NEGATIVE NEGATIVE Final    Comment:        The GeneXpert MRSA Assay (FDA approved for NASAL specimens only), is one component of a comprehensive MRSA  colonization surveillance program. It is not intended to diagnose MRSA infection nor to guide or monitor treatment for MRSA infections. Performed at [facility], [place] [address] [place], [place] [address]   Blood Culture (routine x 2)     Status: None (Preliminary result)   Collection Time: 07/24/19  5:15 AM   Specimen: BLOOD RIGHT HAND  Result Value Ref Range Status   Specimen Description   Final    BLOOD RIGHT HAND Performed at [facility], [place] [address] [place], [place] [address]    Special Requests   Final    BOTTLES DRAWN AEROBIC ONLY Blood Culture adequate volume Performed at [facility] [address], [place], [place] [address]    Culture   Final    NO GROWTH 4 DAYS Performed at [facility], [place] [address], [place], [place] [address]    Report Status PENDING  Incomplete    Procedures/Studies: Ct Angio Chest Pe W Or Wo Contrast  Result Date: 07/23/2019 CLINICAL DATA:  Shortness of breath. EXAM: CT ANGIOGRAPHY CHEST WITH CONTRAST TECHNIQUE: Multidetector CT imaging of the chest was performed using the standard protocol during bolus administration of intravenous contrast. Multiplanar CT image reconstructions and MIPs were obtained to evaluate the vascular anatomy. CONTRAST:  175mL OMNIPAQUE IOHEXOL 350 MG/ML SOLN COMPARISON:  May 21, 2014. FINDINGS: Cardiovascular: Satisfactory opacification of the pulmonary arteries to the segmental level. No evidence of pulmonary embolism. Normal heart size. No pericardial effusion. Mediastinum/Nodes: No enlarged mediastinal, hilar, or axillary lymph nodes. Thyroid gland, trachea, and esophagus demonstrate no significant findings. Lungs/Pleura: No pneumothorax or pleural effusion is noted. Minimal bilateral  posterior basilar subsegmental atelectasis is noted. Upper Abdomen: No acute abnormality. Musculoskeletal: No chest wall abnormality. No acute or significant osseous findings.  Review of the MIP images confirms the above findings. IMPRESSION: No definite evidence of pulmonary embolus. Minimal bilateral posterior basilar subsegmental atelectasis. Electronically Signed   By: Marijo Conception M.D.   On: 07/23/2019 07:13   Dg Chest Portable 1 View  Result Date: 07/23/2019 CLINICAL DATA:  73 year old female with fever and shortness of breath. EXAM: PORTABLE CHEST 1 VIEW COMPARISON:  Chest radiographs 03/26/2018 and earlier. FINDINGS: Portable AP semi upright view at 0131 hours. Mildly lower lung volumes. Mediastinal contours remain normal. Visualized tracheal air column is within normal limits. Allowing for portable technique the lungs are clear. No acute osseous abnormality identified. IMPRESSION: Negative portable chest. Electronically Signed   By: Genevie Ann M.D.   On: 07/23/2019 01:39   Ct Renal Stone Study  Result Date: 07/23/2019 CLINICAL DATA:  Hematuria EXAM: CT ABDOMEN AND PELVIS WITHOUT CONTRAST TECHNIQUE: Multidetector CT imaging of the abdomen and pelvis was performed following the standard protocol without oral or IV contrast. Note that there is contrast throughout the collecting systems, ureters, and bladder from CT angiogram chest obtained earlier in the day. COMPARISON:  CT abdomen and pelvis November 22, 2015 FINDINGS: Lower chest: There is bibasilar atelectasis. There is no lung base edema or consolidation. Hepatobiliary: No focal liver lesions are appreciable. Gallbladder wall is not appreciably thickened. There is no biliary duct dilatation. Pancreas: There is no pancreatic mass or inflammatory focus. Spleen: No splenic lesions are evident. Adrenals/Urinary Tract: Adrenals appear normal bilaterally. There is no evident renal mass or hydronephrosis on either side. There is contrast throughout the renal collecting systems, ureters, and bladder which makes assessment for small calculi limited. No obvious obstructing lesions are identified. Urinary bladder wall thickness  appears within normal limits. The extensive concentration of intravenous contrast in the bladder limits assessment of the bladder for potential lesion within the bladder. Stomach/Bowel: There is no appreciable bowel wall or mesenteric thickening. There is no evident bowel obstruction. Terminal ileum appears normal. There is no free air or portal venous air. Vascular/Lymphatic: No abdominal aortic aneurysm. There is aortic and iliac artery atherosclerosis. There is no evident adenopathy in the abdomen or pelvis. Reproductive: Uterus is absent.  No evident pelvic mass. Other: No periappendiceal region inflammation. No abscess or ascites evident in the abdomen or pelvis. Musculoskeletal: Patient has had kyphoplasty procedures at L2 and L5. There is also a degree of anterior wedging at L3 and L4. Bones are osteoporotic. There is an old healed fracture of the left ischium. No blastic or lytic bone lesions are evident. There is spinal stenosis at L3-4 due to bony hypertrophy and disc protrusion. There is also spinal stenosis at L4-5 due to similar factors. There is also a slight degree of anterolisthesis of L4 on L5 which contributes to the stenosis. No intramuscular or abdominal wall lesions are evident. IMPRESSION: 1. There is extensive contrast within the renal collecting systems, ureters, and bladder which limits assessment for potential smaller calculi or lesion within the bladder. Note that contrast in the urinary bladder is quite concentrated at this time period no obstructing foci are identified in either collecting system or ureter. No hydronephrosis on either side. If hematuria persists, a repeat study after intravenous contrast has cleared from the urinary system may be helpful to further assess. 2. No bowel obstruction. No abscess in the abdomen or pelvis. No periappendiceal region inflammation evident.  3.  Uterus absent. 4. Osteoporosis with multiple lumbar compression fractures. Status post kyphoplasty  procedures at L2 and L5. Mild spondylolisthesis at L4-5, likely due to underlying spondylosis. Spinal stenosis at L3-4 and L4-5, multifactorial in etiology. 5.  Aortic Atherosclerosis (ICD10-I70.0). Electronically Signed   By: Lowella Grip III M.D.   On: 07/23/2019 09:37     Time coordinating discharge: Over 30 minutes  SIGNED:   Guilford Shi, MD  [facility] 07/28/2019, 2:56 PM Pager : [phone]

## 2019-07-29 LAB — CULTURE, BLOOD (ROUTINE X 2)
Culture: NO GROWTH
Special Requests: ADEQUATE

## 2019-08-05 DIAGNOSIS — F411 Generalized anxiety disorder: Secondary | ICD-10-CM | POA: Diagnosis not present

## 2019-08-05 DIAGNOSIS — G8222 Paraplegia, incomplete: Secondary | ICD-10-CM | POA: Diagnosis not present

## 2019-08-05 DIAGNOSIS — N390 Urinary tract infection, site not specified: Secondary | ICD-10-CM | POA: Diagnosis not present

## 2019-08-07 DIAGNOSIS — F419 Anxiety disorder, unspecified: Secondary | ICD-10-CM | POA: Diagnosis not present

## 2019-08-07 DIAGNOSIS — A419 Sepsis, unspecified organism: Secondary | ICD-10-CM | POA: Diagnosis not present

## 2019-08-07 DIAGNOSIS — N390 Urinary tract infection, site not specified: Secondary | ICD-10-CM | POA: Diagnosis not present

## 2019-08-07 DIAGNOSIS — G629 Polyneuropathy, unspecified: Secondary | ICD-10-CM | POA: Diagnosis not present

## 2019-08-07 DIAGNOSIS — Z79899 Other long term (current) drug therapy: Secondary | ICD-10-CM | POA: Diagnosis not present

## 2019-08-07 DIAGNOSIS — J9601 Acute respiratory failure with hypoxia: Secondary | ICD-10-CM | POA: Diagnosis not present

## 2019-08-07 DIAGNOSIS — R6520 Severe sepsis without septic shock: Secondary | ICD-10-CM | POA: Diagnosis not present

## 2019-08-07 DIAGNOSIS — E876 Hypokalemia: Secondary | ICD-10-CM | POA: Diagnosis not present

## 2019-08-07 DIAGNOSIS — G8929 Other chronic pain: Secondary | ICD-10-CM | POA: Diagnosis not present

## 2019-08-07 DIAGNOSIS — M549 Dorsalgia, unspecified: Secondary | ICD-10-CM | POA: Diagnosis not present

## 2019-08-07 DIAGNOSIS — D649 Anemia, unspecified: Secondary | ICD-10-CM | POA: Diagnosis not present

## 2019-08-07 DIAGNOSIS — R269 Unspecified abnormalities of gait and mobility: Secondary | ICD-10-CM | POA: Diagnosis not present

## 2019-08-07 DIAGNOSIS — B0082 Herpes simplex myelitis: Secondary | ICD-10-CM | POA: Diagnosis not present

## 2019-08-11 DIAGNOSIS — M5416 Radiculopathy, lumbar region: Secondary | ICD-10-CM | POA: Diagnosis not present

## 2019-08-14 DIAGNOSIS — R82998 Other abnormal findings in urine: Secondary | ICD-10-CM | POA: Diagnosis not present

## 2019-08-14 DIAGNOSIS — R338 Other retention of urine: Secondary | ICD-10-CM | POA: Diagnosis not present

## 2019-08-20 DIAGNOSIS — R339 Retention of urine, unspecified: Secondary | ICD-10-CM | POA: Diagnosis not present

## 2019-08-27 DIAGNOSIS — J449 Chronic obstructive pulmonary disease, unspecified: Secondary | ICD-10-CM | POA: Diagnosis not present

## 2019-08-27 DIAGNOSIS — J9601 Acute respiratory failure with hypoxia: Secondary | ICD-10-CM | POA: Diagnosis not present

## 2019-09-07 ENCOUNTER — Ambulatory Visit: Payer: PPO | Admitting: Neurology

## 2019-09-07 ENCOUNTER — Other Ambulatory Visit: Payer: Self-pay | Admitting: Neurology

## 2019-09-10 DIAGNOSIS — Z5181 Encounter for therapeutic drug level monitoring: Secondary | ICD-10-CM | POA: Diagnosis not present

## 2019-09-10 DIAGNOSIS — M4310 Spondylolisthesis, site unspecified: Secondary | ICD-10-CM | POA: Diagnosis not present

## 2019-09-10 DIAGNOSIS — M5416 Radiculopathy, lumbar region: Secondary | ICD-10-CM | POA: Diagnosis not present

## 2019-09-10 DIAGNOSIS — G894 Chronic pain syndrome: Secondary | ICD-10-CM | POA: Diagnosis not present

## 2019-09-10 DIAGNOSIS — Z79899 Other long term (current) drug therapy: Secondary | ICD-10-CM | POA: Diagnosis not present

## 2019-09-10 DIAGNOSIS — M5136 Other intervertebral disc degeneration, lumbar region: Secondary | ICD-10-CM | POA: Diagnosis not present

## 2019-09-14 ENCOUNTER — Encounter: Payer: Self-pay | Admitting: Neurology

## 2019-09-20 ENCOUNTER — Other Ambulatory Visit: Payer: Self-pay | Admitting: Neurology

## 2019-09-20 DIAGNOSIS — G9340 Encephalopathy, unspecified: Secondary | ICD-10-CM

## 2019-09-20 DIAGNOSIS — R269 Unspecified abnormalities of gait and mobility: Secondary | ICD-10-CM

## 2019-09-20 DIAGNOSIS — G0490 Encephalitis and encephalomyelitis, unspecified: Secondary | ICD-10-CM

## 2019-09-20 DIAGNOSIS — R413 Other amnesia: Secondary | ICD-10-CM

## 2019-09-20 DIAGNOSIS — M792 Neuralgia and neuritis, unspecified: Secondary | ICD-10-CM

## 2019-09-23 ENCOUNTER — Ambulatory Visit: Payer: PPO | Attending: Internal Medicine

## 2019-09-23 DIAGNOSIS — Z23 Encounter for immunization: Secondary | ICD-10-CM

## 2019-09-23 NOTE — Progress Notes (Signed)
   Covid-19 Vaccination Clinic  Name:  Katelyn Lamb    MRN: [id number] DOB: Jun 09, 1946  09/23/2019  [name] was observed post Covid-19 immunization for 15 minutes without incidence. She was provided with Vaccine Information Sheet and instruction to access the V-Safe system.   [name] was instructed to call 911 with any severe reactions post vaccine: Marland Kitchen Difficulty breathing  . Swelling of your face and throat  . A fast heartbeat  . A bad rash all over your body  . Dizziness and weakness    Immunizations Administered    Name Date Dose VIS Date Route   Pfizer COVID-19 Vaccine 09/23/2019  6:10 PM 0.3 mL 08/14/2019 Intramuscular   Manufacturer: [place]   Lot: [id number]   [place]: [id number]

## 2019-09-27 DIAGNOSIS — J449 Chronic obstructive pulmonary disease, unspecified: Secondary | ICD-10-CM | POA: Diagnosis not present

## 2019-09-27 DIAGNOSIS — J9601 Acute respiratory failure with hypoxia: Secondary | ICD-10-CM | POA: Diagnosis not present

## 2019-10-13 ENCOUNTER — Other Ambulatory Visit: Payer: Self-pay | Admitting: Neurology

## 2019-10-13 DIAGNOSIS — R413 Other amnesia: Secondary | ICD-10-CM

## 2019-10-13 DIAGNOSIS — R269 Unspecified abnormalities of gait and mobility: Secondary | ICD-10-CM

## 2019-10-13 DIAGNOSIS — M792 Neuralgia and neuritis, unspecified: Secondary | ICD-10-CM

## 2019-10-13 DIAGNOSIS — G0490 Encephalitis and encephalomyelitis, unspecified: Secondary | ICD-10-CM

## 2019-10-13 DIAGNOSIS — G9340 Encephalopathy, unspecified: Secondary | ICD-10-CM

## 2019-10-14 ENCOUNTER — Ambulatory Visit: Payer: PPO | Attending: Internal Medicine

## 2019-10-14 DIAGNOSIS — Z23 Encounter for immunization: Secondary | ICD-10-CM | POA: Insufficient documentation

## 2019-10-14 NOTE — Progress Notes (Signed)
   Covid-19 Vaccination Clinic  Name:  Katelyn Lamb    MRN: [id number] DOB: Feb 20, 1946  10/14/2019  Katelyn Lamb was observed post Covid-19 immunization for 15 minutes without incidence. She was provided with Vaccine Information Sheet and instruction to access the V-Safe system.   Katelyn Lamb was instructed to call 911 with any severe reactions post vaccine: Marland Kitchen Difficulty breathing  . Swelling of your face and throat  . A fast heartbeat  . A bad rash all over your body  . Dizziness and weakness    Immunizations Administered    Name Date Dose VIS Date Route   Pfizer COVID-19 Vaccine 10/14/2019  3:41 PM 0.3 mL 08/14/2019 Intramuscular   Manufacturer: [employer]   Lot: [id number]   [place]: [id number]

## 2019-11-06 DIAGNOSIS — R399 Unspecified symptoms and signs involving the genitourinary system: Secondary | ICD-10-CM | POA: Diagnosis not present

## 2019-11-06 DIAGNOSIS — R296 Repeated falls: Secondary | ICD-10-CM | POA: Diagnosis not present

## 2019-11-06 DIAGNOSIS — F411 Generalized anxiety disorder: Secondary | ICD-10-CM | POA: Diagnosis not present

## 2019-11-06 DIAGNOSIS — F331 Major depressive disorder, recurrent, moderate: Secondary | ICD-10-CM | POA: Diagnosis not present

## 2019-11-09 DIAGNOSIS — Z1231 Encounter for screening mammogram for malignant neoplasm of breast: Secondary | ICD-10-CM | POA: Diagnosis not present

## 2019-11-09 DIAGNOSIS — Z6825 Body mass index (BMI) 25.0-25.9, adult: Secondary | ICD-10-CM | POA: Diagnosis not present

## 2019-11-09 DIAGNOSIS — Z779 Other contact with and (suspected) exposures hazardous to health: Secondary | ICD-10-CM | POA: Diagnosis not present

## 2019-11-09 DIAGNOSIS — Z124 Encounter for screening for malignant neoplasm of cervix: Secondary | ICD-10-CM | POA: Diagnosis not present

## 2019-11-16 ENCOUNTER — Telehealth (INDEPENDENT_AMBULATORY_CARE_PROVIDER_SITE_OTHER): Payer: PPO | Admitting: Neurology

## 2019-11-16 ENCOUNTER — Encounter: Payer: Self-pay | Admitting: Neurology

## 2019-11-16 DIAGNOSIS — G0490 Encephalitis and encephalomyelitis, unspecified: Secondary | ICD-10-CM | POA: Diagnosis not present

## 2019-11-16 DIAGNOSIS — G9340 Encephalopathy, unspecified: Secondary | ICD-10-CM

## 2019-11-16 DIAGNOSIS — R413 Other amnesia: Secondary | ICD-10-CM

## 2019-11-16 DIAGNOSIS — R269 Unspecified abnormalities of gait and mobility: Secondary | ICD-10-CM

## 2019-11-16 DIAGNOSIS — M792 Neuralgia and neuritis, unspecified: Secondary | ICD-10-CM

## 2019-11-16 MED ORDER — PREGABALIN 50 MG PO CAPS: 100.0000 mg | ORAL_CAPSULE | Freq: Every day | ORAL | 0 refills | Status: DC

## 2019-11-16 MED ORDER — QUETIAPINE FUMARATE 25 MG PO TABS: 25.0000 mg | ORAL_TABLET | Freq: Every day | ORAL | Status: DC

## 2019-11-16 NOTE — Progress Notes (Signed)
   Virtual Visit via Video Note  I connected with Katelyn Lamb on 11/16/19 at 11:00 AM EDT by a video enabled telemedicine application and verified that I am speaking with the correct person using two identifiers.  Location: Patient: The patient is at home Provider: Physician is in the office.   I discussed the limitations of evaluation and management by telemedicine and the availability of in person appointments. The patient expressed understanding and agreed to proceed.  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  The patient has also described some troubles with her memory.  She is walking with a walker, she has had no falls since last seen.  She has a neurogenic bladder, she was in the hospital on 23 July 2019 with a severe bladder infection.  She just recently finished a course of Cipro for another bladder infection, when on Cipro she cut back on the Lyrica from 100 mg at night to 50 mg at night, she remains on Cymbalta taking 30 mg in the morning and 60 mg in the evening.  She takes Seroquel 25 mg at night if needed for sleep.  She has not been sleeping well over the last 2 to 3 days because of severe pain that is all over.  She just finished the Cipro today.   Observations/Objective: The clinical evaluation reveals the patient is alert and cooperative, she has symmetric face, good midline protrusion of the tongue with good lateral movement of the tongue.  Speech is well enunciated.  She has good full extraocular movements.  The patient has some dysmetria, left greater than right upper extremity with finger-nose-finger.  She is able to perform heel shin.  She is able to ambulate a short distance with a walker with short steps, she has relatively good stability with a walker.  Assessment and Plan: 1.  History of viral  meningoencephalitis  2.  Neurogenic bladder, frequent urinary tract infections  3.  Gait disorder  4.  Memory disorder  The patient has had worsening pain when she came down off of her Lyrica.  She is to go back to 100 mg of Lyrica at night, she does not take it during the day because it changes her balance.  The patient may try hemp oil to see if this helps her sleep and pain levels as well.  She will follow-up here in 6 months.  Follow Up Instructions: 75-month follow-up with me   I discussed the assessment and treatment plan with the patient. The patient was provided an opportunity to ask questions and all were answered. The patient agreed with the plan and demonstrated an understanding of the instructions.   The patient was advised to call back or seek an in-person evaluation if the symptoms worsen or if the condition fails to improve as anticipated.  I provided 20 minutes of non-face-to-face time during this encounter.   [name], MD

## 2019-12-06 ENCOUNTER — Emergency Department (HOSPITAL_COMMUNITY): Payer: PPO

## 2019-12-06 ENCOUNTER — Observation Stay (HOSPITAL_COMMUNITY): Payer: PPO

## 2019-12-06 ENCOUNTER — Encounter (HOSPITAL_COMMUNITY): Payer: Self-pay | Admitting: Internal Medicine

## 2019-12-06 ENCOUNTER — Inpatient Hospital Stay (HOSPITAL_COMMUNITY)
Admission: EM | Admit: 2019-12-06 | Discharge: 2019-12-18 | DRG: 535 | Disposition: A | Discharge status: Skilled Nursing Facility | Payer: PPO | Attending: Internal Medicine | Admitting: Internal Medicine

## 2019-12-06 DIAGNOSIS — R339 Retention of urine, unspecified: Secondary | ICD-10-CM | POA: Diagnosis present

## 2019-12-06 DIAGNOSIS — S3282XA Multiple fractures of pelvis without disruption of pelvic ring, initial encounter for closed fracture: Secondary | ICD-10-CM | POA: Diagnosis not present

## 2019-12-06 DIAGNOSIS — Z743 Need for continuous supervision: Secondary | ICD-10-CM | POA: Diagnosis not present

## 2019-12-06 DIAGNOSIS — Z87891 Personal history of nicotine dependence: Secondary | ICD-10-CM

## 2019-12-06 DIAGNOSIS — Z20822 Contact with and (suspected) exposure to covid-19: Secondary | ICD-10-CM | POA: Diagnosis present

## 2019-12-06 DIAGNOSIS — F29 Unspecified psychosis not due to a substance or known physiological condition: Secondary | ICD-10-CM | POA: Diagnosis not present

## 2019-12-06 DIAGNOSIS — R262 Difficulty in walking, not elsewhere classified: Secondary | ICD-10-CM | POA: Diagnosis not present

## 2019-12-06 DIAGNOSIS — Z90710 Acquired absence of both cervix and uterus: Secondary | ICD-10-CM

## 2019-12-06 DIAGNOSIS — S61412A Laceration without foreign body of left hand, initial encounter: Secondary | ICD-10-CM | POA: Diagnosis present

## 2019-12-06 DIAGNOSIS — F329 Major depressive disorder, single episode, unspecified: Secondary | ICD-10-CM | POA: Diagnosis present

## 2019-12-06 DIAGNOSIS — M79642 Pain in left hand: Secondary | ICD-10-CM | POA: Diagnosis not present

## 2019-12-06 DIAGNOSIS — S0990XA Unspecified injury of head, initial encounter: Secondary | ICD-10-CM | POA: Diagnosis not present

## 2019-12-06 DIAGNOSIS — J9601 Acute respiratory failure with hypoxia: Secondary | ICD-10-CM | POA: Diagnosis present

## 2019-12-06 DIAGNOSIS — R0902 Hypoxemia: Secondary | ICD-10-CM | POA: Diagnosis present

## 2019-12-06 DIAGNOSIS — Z8661 Personal history of infections of the central nervous system: Secondary | ICD-10-CM | POA: Diagnosis not present

## 2019-12-06 DIAGNOSIS — B0082 Herpes simplex myelitis: Secondary | ICD-10-CM | POA: Diagnosis not present

## 2019-12-06 DIAGNOSIS — S6992XA Unspecified injury of left wrist, hand and finger(s), initial encounter: Secondary | ICD-10-CM | POA: Diagnosis not present

## 2019-12-06 DIAGNOSIS — M6281 Muscle weakness (generalized): Secondary | ICD-10-CM | POA: Diagnosis not present

## 2019-12-06 DIAGNOSIS — R269 Unspecified abnormalities of gait and mobility: Secondary | ICD-10-CM

## 2019-12-06 DIAGNOSIS — S199XXA Unspecified injury of neck, initial encounter: Secondary | ICD-10-CM | POA: Diagnosis not present

## 2019-12-06 DIAGNOSIS — Z79899 Other long term (current) drug therapy: Secondary | ICD-10-CM

## 2019-12-06 DIAGNOSIS — R5381 Other malaise: Secondary | ICD-10-CM | POA: Diagnosis not present

## 2019-12-06 DIAGNOSIS — M792 Neuralgia and neuritis, unspecified: Secondary | ICD-10-CM | POA: Diagnosis not present

## 2019-12-06 DIAGNOSIS — W08XXXA Fall from other furniture, initial encounter: Secondary | ICD-10-CM | POA: Diagnosis present

## 2019-12-06 DIAGNOSIS — N319 Neuromuscular dysfunction of bladder, unspecified: Secondary | ICD-10-CM | POA: Diagnosis present

## 2019-12-06 DIAGNOSIS — R296 Repeated falls: Secondary | ICD-10-CM | POA: Diagnosis present

## 2019-12-06 DIAGNOSIS — G629 Polyneuropathy, unspecified: Secondary | ICD-10-CM | POA: Diagnosis present

## 2019-12-06 DIAGNOSIS — Z792 Long term (current) use of antibiotics: Secondary | ICD-10-CM | POA: Diagnosis not present

## 2019-12-06 DIAGNOSIS — D72829 Elevated white blood cell count, unspecified: Secondary | ICD-10-CM | POA: Diagnosis present

## 2019-12-06 DIAGNOSIS — S79912A Unspecified injury of left hip, initial encounter: Secondary | ICD-10-CM | POA: Diagnosis not present

## 2019-12-06 DIAGNOSIS — W19XXXA Unspecified fall, initial encounter: Secondary | ICD-10-CM | POA: Diagnosis not present

## 2019-12-06 DIAGNOSIS — S61412D Laceration without foreign body of left hand, subsequent encounter: Secondary | ICD-10-CM | POA: Diagnosis not present

## 2019-12-06 DIAGNOSIS — M25552 Pain in left hip: Secondary | ICD-10-CM | POA: Diagnosis present

## 2019-12-06 DIAGNOSIS — G4700 Insomnia, unspecified: Secondary | ICD-10-CM | POA: Diagnosis present

## 2019-12-06 DIAGNOSIS — R278 Other lack of coordination: Secondary | ICD-10-CM | POA: Diagnosis not present

## 2019-12-06 DIAGNOSIS — R519 Headache, unspecified: Secondary | ICD-10-CM | POA: Diagnosis not present

## 2019-12-06 DIAGNOSIS — Z66 Do not resuscitate: Secondary | ICD-10-CM | POA: Diagnosis present

## 2019-12-06 DIAGNOSIS — S32512A Fracture of superior rim of left pubis, initial encounter for closed fracture: Secondary | ICD-10-CM | POA: Diagnosis not present

## 2019-12-06 DIAGNOSIS — R41841 Cognitive communication deficit: Secondary | ICD-10-CM | POA: Diagnosis not present

## 2019-12-06 DIAGNOSIS — N179 Acute kidney failure, unspecified: Secondary | ICD-10-CM | POA: Diagnosis present

## 2019-12-06 DIAGNOSIS — R270 Ataxia, unspecified: Secondary | ICD-10-CM | POA: Diagnosis not present

## 2019-12-06 DIAGNOSIS — S32409A Unspecified fracture of unspecified acetabulum, initial encounter for closed fracture: Secondary | ICD-10-CM | POA: Diagnosis not present

## 2019-12-06 DIAGNOSIS — S32592D Other specified fracture of left pubis, subsequent encounter for fracture with routine healing: Secondary | ICD-10-CM | POA: Diagnosis not present

## 2019-12-06 DIAGNOSIS — K219 Gastro-esophageal reflux disease without esophagitis: Secondary | ICD-10-CM | POA: Diagnosis present

## 2019-12-06 DIAGNOSIS — G894 Chronic pain syndrome: Secondary | ICD-10-CM | POA: Diagnosis present

## 2019-12-06 DIAGNOSIS — G8929 Other chronic pain: Secondary | ICD-10-CM | POA: Diagnosis not present

## 2019-12-06 DIAGNOSIS — G0490 Encephalitis and encephalomyelitis, unspecified: Secondary | ICD-10-CM

## 2019-12-06 DIAGNOSIS — J9811 Atelectasis: Secondary | ICD-10-CM | POA: Diagnosis not present

## 2019-12-06 DIAGNOSIS — S32592A Other specified fracture of left pubis, initial encounter for closed fracture: Principal | ICD-10-CM | POA: Diagnosis present

## 2019-12-06 DIAGNOSIS — S329XXA Fracture of unspecified parts of lumbosacral spine and pelvis, initial encounter for closed fracture: Secondary | ICD-10-CM

## 2019-12-06 DIAGNOSIS — Y92009 Unspecified place in unspecified non-institutional (private) residence as the place of occurrence of the external cause: Secondary | ICD-10-CM

## 2019-12-06 DIAGNOSIS — M79643 Pain in unspecified hand: Secondary | ICD-10-CM | POA: Diagnosis not present

## 2019-12-06 DIAGNOSIS — S3289XA Fracture of other parts of pelvis, initial encounter for closed fracture: Secondary | ICD-10-CM | POA: Diagnosis not present

## 2019-12-06 DIAGNOSIS — R2689 Other abnormalities of gait and mobility: Secondary | ICD-10-CM | POA: Diagnosis not present

## 2019-12-06 DIAGNOSIS — R52 Pain, unspecified: Secondary | ICD-10-CM | POA: Diagnosis not present

## 2019-12-06 DIAGNOSIS — R2681 Unsteadiness on feet: Secondary | ICD-10-CM | POA: Diagnosis not present

## 2019-12-06 DIAGNOSIS — R279 Unspecified lack of coordination: Secondary | ICD-10-CM | POA: Diagnosis not present

## 2019-12-06 DIAGNOSIS — F419 Anxiety disorder, unspecified: Secondary | ICD-10-CM | POA: Diagnosis present

## 2019-12-06 DIAGNOSIS — R58 Hemorrhage, not elsewhere classified: Secondary | ICD-10-CM | POA: Diagnosis not present

## 2019-12-06 DIAGNOSIS — Z9181 History of falling: Secondary | ICD-10-CM | POA: Diagnosis not present

## 2019-12-06 LAB — CBC
HCT: 39.3 % (ref 36.0–46.0)
Hemoglobin: 12.1 g/dL (ref 12.0–15.0)
MCH: 30.4 pg (ref 26.0–34.0)
MCHC: 30.8 g/dL (ref 30.0–36.0)
MCV: 98.7 fL (ref 80.0–100.0)
Platelets: 202 10*3/uL (ref 150–400)
RBC: 3.98 MIL/uL (ref 3.87–5.11)
RDW: 13.2 % (ref 11.5–15.5)
WBC: 11.7 10*3/uL — ABNORMAL HIGH (ref 4.0–10.5)
nRBC: 0 % (ref 0.0–0.2)

## 2019-12-06 LAB — COMPREHENSIVE METABOLIC PANEL
ALT: 23 U/L (ref 0–44)
AST: 35 U/L (ref 15–41)
Albumin: 3.4 g/dL — ABNORMAL LOW (ref 3.5–5.0)
Alkaline Phosphatase: 121 U/L (ref 38–126)
Anion gap: 10 (ref 5–15)
BUN: 11 mg/dL (ref 8–23)
CO2: 29 mmol/L (ref 22–32)
Calcium: 9 mg/dL (ref 8.9–10.3)
Chloride: 101 mmol/L (ref 98–111)
Creatinine, Ser: 1.18 mg/dL — ABNORMAL HIGH (ref 0.44–1.00)
GFR calc Af Amer: 53 mL/min — ABNORMAL LOW (ref >60)
GFR calc non Af Amer: 46 mL/min — ABNORMAL LOW (ref >60)
Glucose, Bld: 106 mg/dL — ABNORMAL HIGH (ref 70–99)
Potassium: 3.8 mmol/L (ref 3.5–5.1)
Sodium: 140 mmol/L (ref 135–145)
Total Bilirubin: 0.4 mg/dL (ref 0.3–1.2)
Total Protein: 6.6 g/dL (ref 6.5–8.1)

## 2019-12-06 LAB — SARS CORONAVIRUS 2 (TAT 6-24 HRS): SARS Coronavirus 2: NEGATIVE

## 2019-12-06 MED ORDER — ONDANSETRON HCL 4 MG PO TABS: 4.0000 mg | ORAL_TABLET | Freq: Four times a day (QID) | ORAL | Status: DC | PRN

## 2019-12-06 MED ORDER — IPRATROPIUM-ALBUTEROL 0.5-2.5 (3) MG/3ML IN SOLN: 3.0000 mL | RESPIRATORY_TRACT | Status: DC | PRN

## 2019-12-06 MED ORDER — ACYCLOVIR 400 MG PO TABS
400.0000 mg | ORAL_TABLET | Freq: Two times a day (BID) | ORAL | Status: DC
  Administered 2019-12-06 – 2019-12-17 (×23): 400 mg via ORAL
  Filled 2019-12-06 (×23): qty 1

## 2019-12-06 MED ORDER — DULOXETINE HCL 30 MG PO CPEP
30.0000 mg | ORAL_CAPSULE | Freq: Every morning | ORAL | Status: DC
  Administered 2019-12-07 – 2019-12-17 (×11): 30 mg via ORAL
  Filled 2019-12-06 (×11): qty 1

## 2019-12-06 MED ORDER — SENNOSIDES-DOCUSATE SODIUM 8.6-50 MG PO TABS
1.0000 | ORAL_TABLET | Freq: Every evening | ORAL | Status: DC | PRN
  Administered 2019-12-13: 1 via ORAL
  Filled 2019-12-06: qty 1

## 2019-12-06 MED ORDER — DIAZEPAM 5 MG PO TABS
5.0000 mg | ORAL_TABLET | Freq: Two times a day (BID) | ORAL | Status: DC
  Administered 2019-12-06 – 2019-12-17 (×22): 5 mg via ORAL
  Filled 2019-12-06 (×23): qty 1

## 2019-12-06 MED ORDER — QUETIAPINE FUMARATE 25 MG PO TABS
25.0000 mg | ORAL_TABLET | Freq: Every day | ORAL | Status: DC
  Administered 2019-12-06 – 2019-12-17 (×12): 25 mg via ORAL
  Filled 2019-12-06 (×14): qty 1

## 2019-12-06 MED ORDER — DULOXETINE HCL 60 MG PO CPEP
60.0000 mg | ORAL_CAPSULE | Freq: Every day | ORAL | Status: DC
  Administered 2019-12-06 – 2019-12-17 (×12): 60 mg via ORAL
  Filled 2019-12-06 (×12): qty 1

## 2019-12-06 MED ORDER — ACETAMINOPHEN 325 MG PO TABS: 650.0000 mg | ORAL_TABLET | Freq: Four times a day (QID) | ORAL | Status: DC | PRN

## 2019-12-06 MED ORDER — ONDANSETRON HCL 4 MG/2ML IJ SOLN: 4.0000 mg | Freq: Four times a day (QID) | INTRAMUSCULAR | Status: DC | PRN

## 2019-12-06 MED ORDER — PREGABALIN 100 MG PO CAPS
100.0000 mg | ORAL_CAPSULE | Freq: Every day | ORAL | Status: DC
  Administered 2019-12-06 – 2019-12-17 (×12): 100 mg via ORAL
  Filled 2019-12-06 (×12): qty 1

## 2019-12-06 MED ORDER — SODIUM CHLORIDE 0.9 % IV SOLN: INTRAVENOUS | Status: AC

## 2019-12-06 MED ORDER — SULFAMETHOXAZOLE-TRIMETHOPRIM 400-80 MG PO TABS
0.5000 | ORAL_TABLET | Freq: Every day | ORAL | Status: DC
  Administered 2019-12-07 – 2019-12-17 (×11): 0.5 via ORAL
  Filled 2019-12-06 (×12): qty 1

## 2019-12-06 MED ORDER — HYDROCODONE-ACETAMINOPHEN 5-325 MG PO TABS
1.0000 | ORAL_TABLET | Freq: Once | ORAL | Status: AC
  Administered 2019-12-06: 1 via ORAL
  Filled 2019-12-06: qty 1

## 2019-12-06 MED ORDER — HEPARIN SODIUM (PORCINE) 5000 UNIT/ML IJ SOLN
5000.0000 [IU] | Freq: Three times a day (TID) | INTRAMUSCULAR | Status: DC
  Administered 2019-12-06 – 2019-12-18 (×34): 5000 [IU] via SUBCUTANEOUS
  Filled 2019-12-06 (×34): qty 1

## 2019-12-06 MED ORDER — LIDOCAINE-EPINEPHRINE (PF) 2 %-1:200000 IJ SOLN
10.0000 mL | Freq: Once | INTRAMUSCULAR | Status: DC
  Filled 2019-12-06: qty 10
  Filled 2019-12-06: qty 20

## 2019-12-06 MED ORDER — POLYETHYLENE GLYCOL 3350 17 G PO PACK
17.0000 g | PACK | Freq: Every day | ORAL | Status: DC
  Administered 2019-12-10 – 2019-12-17 (×2): 17 g via ORAL
  Filled 2019-12-06 (×9): qty 1

## 2019-12-06 MED ORDER — HYDROCODONE-ACETAMINOPHEN 10-325 MG PO TABS
1.0000 | ORAL_TABLET | Freq: Two times a day (BID) | ORAL | Status: DC | PRN
  Administered 2019-12-07 – 2019-12-09 (×5): 1 via ORAL
  Filled 2019-12-06 (×7): qty 1

## 2019-12-06 MED ORDER — ACETAMINOPHEN 650 MG RE SUPP: 650.0000 mg | Freq: Four times a day (QID) | RECTAL | Status: DC | PRN

## 2019-12-06 MED ORDER — MORPHINE SULFATE (PF) 2 MG/ML IV SOLN
1.0000 mg | INTRAVENOUS | Status: DC | PRN
  Administered 2019-12-06 – 2019-12-09 (×3): 1 mg via INTRAVENOUS
  Filled 2019-12-06 (×3): qty 1

## 2019-12-06 MED ORDER — PANTOPRAZOLE SODIUM 40 MG PO TBEC
40.0000 mg | DELAYED_RELEASE_TABLET | Freq: Every day | ORAL | Status: DC
  Administered 2019-12-06 – 2019-12-17 (×12): 40 mg via ORAL
  Filled 2019-12-06 (×11): qty 1

## 2019-12-06 NOTE — ED Provider Notes (Signed)
[facility] EMERGENCY DEPARTMENT Provider Note   CSN: [id number] Arrival date & time: 12/06/19  1354     History No chief complaint on file.   Katelyn Lamb is a 74 y.o. female.  HPI     74 year old female history of herpes encephalitis, anxiety, back pain fell off of a barstool today while at her son's.  She denies any loss of consciousness.  On presentation she is complaining of left hip pain.  Past Medical History:  Diagnosis Date  . Anxiety   . Back pain   . Gait abnormality 11/14/2016  . Memory difficulty 03/02/2019  . Myelitis due to herpes simplex [facility])     Patient Active Problem List   Diagnosis Date Noted  . Severe sepsis ([place]) 07/23/2019  . UTI (urinary tract infection) 07/23/2019  . Hematuria 07/23/2019  . Acute respiratory failure with hypoxia ([place]) 07/23/2019  . Hypokalemia 07/23/2019  . Acute on chronic anemia 07/23/2019  . Fever 07/23/2019  . Leukocytosis 07/23/2019  . Sepsis ([place]) 07/23/2019  . Generalized anxiety disorder 06/17/2019  . Menopausal sweats 06/17/2019  . Memory difficulty 03/02/2019  . Degenerative spondylolisthesis 09/02/2018  . Delirium 03/26/2018  . SIRS (systemic inflammatory response syndrome) ([place]) 03/26/2018  . 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 Degeneration of lumbar intervertebral disc 11/08/2017  . Lumbar radiculopathy 11/06/2017  . Chronic neck pain 09/12/2017  . Chronic low back pain 09/06/2017  . Chronic pain syndrome 09/06/2017  . Dilated pancreatic duct 01/24/2017  . DVT, lower extremity, distal, chronic ([place]) 01/22/2017  . Elevated serum GGT level 01/22/2017  . Medication monitoring encounter 01/08/2017  . Gait abnormality 11/14/2016  . Hypotension due to drugs   . Urinary retention   . Anxiety about health   . Reactive depression   . Ataxia   . Abdominal spasms   . Constipation due to pain medication   . Acute lower UTI   . Dysuria   . Acute deep vein  thrombosis (DVT) of popliteal vein of left lower extremity ([place])   . Incomplete paraplegia ([place])   . Acute blood loss anemia   . Neurogenic bladder   . Neuropathic pain   . Muscle spasm   . Gastroesophageal reflux disease   . Slow transit constipation   . Thrombocytopenia ([place]) 10/03/2016  . Abnormal MRI, spinal cord   . Encephalomyelitis   . Numbness   . Intractable back pain 09/20/2016  . Numbness of left lower extremity 09/20/2016  . Hyponatremia 09/20/2016  . Herpes zoster without complication 0000000  . Spondylosis of cervical region without myelopathy or radiculopathy 06/16/2015    Past Surgical History:  Procedure Laterality Date  . ABDOMINAL HYSTERECTOMY    . BLADDER REPAIR    . CESAREAN SECTION    . IR KYPHO LUMBAR INC FX REDUCE BONE BX UNI/BIL CANNULATION INC/IMAGING  04/11/2018  . TUBAL LIGATION       OB History   No obstetric history on file.     Family History  Problem Relation Age of Onset  . Hypertension Mother     Social History   Tobacco Use  . Smoking status: Former Research scientist (life sciences)  . Smokeless tobacco: Never Used  Substance Use Topics  . Alcohol use: No  . Drug use: No    Home Medications Prior to Admission medications   Medication Sig Start Date End Date Taking? Authorizing Provider  acyclovir (ZOVIRAX) 400 MG tablet TAKE 1 TABLET BY MOUTH TWICE A DAY Patient  taking differently: Take 400 mg by mouth 2 (two) times daily.  07/13/19   [name], MD  conjugated estrogens (PREMARIN) vaginal cream Place 1 Applicatorful vaginally daily as needed (itching).     [provider]  diazepam (VALIUM) 5 MG tablet Take 5 mg by mouth 2 (two) times daily.    [provider]  diphenoxylate-atropine (LOMOTIL) 2.5-0.025 MG tablet Take 1 tablet by mouth 4 (four) times daily as needed for diarrhea or loose stools.  02/27/19   [provider]  DULoxetine (CYMBALTA) 30 MG capsule TAKE 1 CAPSULE BY MOUTH EVERY DAY 09/07/19   [name],  MD  DULoxetine (CYMBALTA) 60 MG capsule TAKE 1 CAPSULE (60 MG TOTAL) BY MOUTH AT BEDTIME. 09/20/19   [name], MD  HYDROcodone-acetaminophen [facility]) 10-325 MG tablet Take 1 tablet by mouth 3 (three) times daily as needed for severe pain.  03/07/18   [provider]  ipratropium-albuterol (DUONEB) 0.5-2.5 (3) MG/3ML SOLN Take 3 mLs by nebulization every 4 (four) hours as needed (For SOB and wheezing). 07/25/19 08/24/19  Dana Allan I, MD  pantoprazole (PROTONIX) 40 MG tablet Take 1 tablet (40 mg total) by mouth daily. 10/26/16   Angiulli, Lavon Paganini, PA-C  polyethylene glycol (MIRALAX / GLYCOLAX) packet Take 17 g by mouth daily. Patient not taking: Reported on 07/23/2019 10/26/16   Angiulli, Lavon Paganini, PA-C  pregabalin (LYRICA) 50 MG capsule Take 2 capsules (100 mg total) by mouth at bedtime. 11/16/19   [name], MD  QUEtiapine (SEROQUEL) 25 MG tablet Take 1 tablet (25 mg total) by mouth at bedtime. 11/16/19   [name], MD    Allergies    Demerol [meperidine], Percocet [oxycodone-acetaminophen], Amoxicillin-pot clavulanate, and Penicillins  Review of Systems   Review of Systems  All other systems reviewed and are negative.   Physical Exam Updated Vital Signs BP 107/82   Pulse 93   Temp 98.8 F (37.1 C) (Oral)   Resp 15   SpO2 92%   Physical Exam Vitals and nursing note reviewed.  Constitutional:      Appearance: Normal appearance.  HENT:     Head: Normocephalic and atraumatic.     Right Ear: External ear normal.     Left Ear: External ear normal.     Nose: Nose normal.     Mouth/Throat:     Pharynx: Oropharynx is clear.  Eyes:     Extraocular Movements: Extraocular movements intact.     Pupils: Pupils are equal, round, and reactive to light.  Cardiovascular:     Rate and Rhythm: Normal rate and regular rhythm.  Pulmonary:     Effort: Pulmonary effort is normal.     Breath sounds: Normal breath sounds.  Abdominal:     General: Abdomen is  flat.     Palpations: Abdomen is soft.  Musculoskeletal:        General: Normal range of motion.     Cervical back: Normal range of motion.     Comments: Tenderness palpation left hip and left groin but has full active range of motion of hip Skin tear to dorsal aspect of left hand  Skin:    General: Skin is warm and dry.     Capillary Refill: Capillary refill takes less than 2 seconds.  Neurological:     General: No focal deficit present.     Mental Status: She is alert.     Coordination: Coordination abnormal.  Psychiatric:  Mood and Affect: Mood normal.     ED Results / Procedures / Treatments   Labs (all labs ordered are listed, but only abnormal results are displayed) Labs Reviewed  CBC  COMPREHENSIVE METABOLIC PANEL    EKG EKG Interpretation  Date/Time:  Sunday December 06 2019 14:13:51 EDT Ventricular Rate:  92 PR Interval:    QRS Duration: 87 QT Interval:  395 QTC Calculation: 492 R Axis:   -77 Text Interpretation: Sinus rhythm Abnormal R-wave progression, late transition Inferior infarct, old Confirmed by Pattricia Boss [phone]) on 12/06/2019 3:50:05 PM   Radiology CT Head Wo Contrast  Result Date: 12/06/2019 CLINICAL DATA:  [place]. Hit head. EXAM: CT HEAD WITHOUT CONTRAST CT CERVICAL SPINE WITHOUT CONTRAST TECHNIQUE: Multidetector CT imaging of the head and cervical spine was performed following the standard protocol without intravenous contrast. Multiplanar CT image reconstructions of the cervical spine were also generated. COMPARISON:  None. FINDINGS: CT HEAD FINDINGS Brain: Age related cerebral atrophy, ventriculomegaly and fairly extensive periventricular white matter disease. No extra-axial fluid collections are identified. No CT findings for acute hemispheric infarction or intracranial hemorrhage. No mass lesions. The brainstem and cerebellum are normal. Vascular: Moderate vascular calcifications. No aneurysm or hyperdense vessels. Skull: No skull fracture or  bone lesions. Sinuses/Orbits: The paranasal sinuses and mastoid air cells are clear. Small amount of fluid in the right mastoid air cells. The globes are intact. Other: No scalp lesions or hematoma. CT CERVICAL SPINE FINDINGS Alignment: Mild reversal of the normal cervical lordosis. The cervical vertebral bodies are normally aligned. Skull base and vertebrae: Advanced degenerative changes noted at C1-2 but no dens or other vertebral body fractures. The facets are normally aligned. No facet or laminar fractures. The skull base C1 and C1-2 articulations are maintained. Soft tissues and spinal canal: No prevertebral fluid or swelling. No visible canal hematoma. Disc levels: The spinal canal is fairly generous. No significant spinal or foraminal stenosis. Upper chest: The upper ribs are intact and the visualized lung apices are grossly clear. Other: No neck mass or adenopathy. IMPRESSION: 1. Age related cerebral atrophy, ventriculomegaly and fairly extensive periventricular white matter disease. 2. No acute intracranial findings or skull fracture. 3. Normal alignment of the cervical vertebral bodies and no acute cervical spine fracture. 4. Moderate degenerative cervical spondylosis with multilevel disc disease and facet disease. Electronically Signed   By: Marijo Sanes M.D.   On: 12/06/2019 15:34   CT Cervical Spine Wo Contrast  Result Date: 12/06/2019 CLINICAL DATA:  [place]. Hit head. EXAM: CT HEAD WITHOUT CONTRAST CT CERVICAL SPINE WITHOUT CONTRAST TECHNIQUE: Multidetector CT imaging of the head and cervical spine was performed following the standard protocol without intravenous contrast. Multiplanar CT image reconstructions of the cervical spine were also generated. COMPARISON:  None. FINDINGS: CT HEAD FINDINGS Brain: Age related cerebral atrophy, ventriculomegaly and fairly extensive periventricular white matter disease. No extra-axial fluid collections are identified. No CT findings for acute hemispheric  infarction or intracranial hemorrhage. No mass lesions. The brainstem and cerebellum are normal. Vascular: Moderate vascular calcifications. No aneurysm or hyperdense vessels. Skull: No skull fracture or bone lesions. Sinuses/Orbits: The paranasal sinuses and mastoid air cells are clear. Small amount of fluid in the right mastoid air cells. The globes are intact. Other: No scalp lesions or hematoma. CT CERVICAL SPINE FINDINGS Alignment: Mild reversal of the normal cervical lordosis. The cervical vertebral bodies are normally aligned. Skull base and vertebrae: Advanced degenerative changes noted at C1-2 but no dens or other vertebral body fractures. The  facets are normally aligned. No facet or laminar fractures. The skull base C1 and C1-2 articulations are maintained. Soft tissues and spinal canal: No prevertebral fluid or swelling. No visible canal hematoma. Disc levels: The spinal canal is fairly generous. No significant spinal or foraminal stenosis. Upper chest: The upper ribs are intact and the visualized lung apices are grossly clear. Other: No neck mass or adenopathy. IMPRESSION: 1. Age related cerebral atrophy, ventriculomegaly and fairly extensive periventricular white matter disease. 2. No acute intracranial findings or skull fracture. 3. Normal alignment of the cervical vertebral bodies and no acute cervical spine fracture. 4. Moderate degenerative cervical spondylosis with multilevel disc disease and facet disease. Electronically Signed   By: Marijo Sanes M.D.   On: 12/06/2019 15:34   DG Hand Complete Left  Result Date: 12/06/2019 CLINICAL DATA:  Fall, pain. EXAM: LEFT HAND - COMPLETE 3+ VIEW COMPARISON:  None. FINDINGS: Osseous alignment is normal. No fracture line or displaced fracture fragment is seen. Degenerative osteoarthritis at the first [facility] joint, moderate in degree with osteophyte formation. Additional degenerative changes at the second through fifth PIP joints, mild to moderate in degree.  IMPRESSION: 1. No acute findings. No osseous fracture or dislocation seen. 2. Degenerative osteoarthritis, most prominent at the first [facility] joint. Electronically Signed   By: Franki Cabot M.D.   On: 12/06/2019 15:24   DG Hip Unilat W or Wo Pelvis 2-3 Views Left  Result Date: 12/06/2019 CLINICAL DATA:  Fall, pain EXAM: DG HIP (WITH OR WITHOUT PELVIS) 2-3V LEFT COMPARISON:  04/26/2017 FINDINGS: Old left inferior pubic ramus fracture noted. There is lucency throughout the superior pubic ramus and pubic bone on the left which could reflect superimposed acute fracture. No proximal femoral abnormality. Hip joints and SI joints are symmetric. IMPRESSION: Old left inferior pubic ramus fracture. Concern for superimposed left pubic bone and superior pubic ramus fractures. Electronically Signed   By: Rolm Baptise M.D.   On: 12/06/2019 15:22    Procedures Procedures (including critical care time)  Medications Ordered in ED Medications - No data to display  ED Course  I have reviewed the triage vital signs and the nursing notes.  Pertinent labs & imaging results that were available during my care of the patient were reviewed by me and considered in my medical decision making (see chart for details).    MDM Rules/Calculators/A&P                     74 year old female who had a fall off of a barstool today.  She denies any lightheadedness or loss of consciousness.  She appears to have a new left superior ramus and inferior ramus fracture superimposed over an old fracture.  Labs are pending.  I discussed her care with Dr. Kathrynn Humble.  Patient will likely require admission for pain control and mobilization.  Final Clinical Impression(s) / ED Diagnoses Final diagnoses:  Fall, initial encounter  Multiple closed fractures of pelvis without disruption of pelvic ring, initial encounter [facility])    Rx / DC Orders ED Discharge Orders    None       Pattricia Boss, MD 12/06/19 1552

## 2019-12-06 NOTE — ED Notes (Signed)
Admitting MD at bedside.

## 2019-12-06 NOTE — H&P (Addendum)
[facility] History and Physical    Katelyn Lamb, is a 74 y.o. female  MRN: [id number]   DOB - 17-Apr-1946  Admit Date - 12/06/2019  Outpatient Primary MD for the patient is Dorthy Cooler, [place], MD  Outpatient Specialists: Dr. Margette Fast (neurology)  Patient coming from: Home  Chief Complaint: Hip pain after fall  HPI: Katelyn Lamb is a 74 y.o. 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  Patient provides history, husband at bedside to provide additional history.  Patient was at son's house for [facility] and sitting on high barstool in the kitchen when she suddenly fell from the chair.  Denies any preceding chest pain, shortness of breath, palpitations, dizziness.  Patient landed on her rear and hit the left side of her head on the wall behind her.  Family was present during the fall.  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  Patient uses a walker at baseline.  Patient is on chronic Bactrim for recurrent UTIs.  Denies any dysuria, hematuria, odorous urine, fevers or chills.  Patient takes Seroquel for sleep.  Denies any changes in dosing.  Chronic pain patient takes opioid as needed for that as well as Lyrica, no changes in medications recently.  Regarding viral encephalomyelitis, patient is on chronic acyclovir.   ED Course:   T-max 98.8, respiratory rate range 17-23, document SPO2 of 87% on room air, on 2 L during examination, hemodynamically stable.  WBC 11.7, creatinine 1.18.  Covid test  pending. CT head negative for acute intracranial abnormalities, CT cervical spine showed normal alignment of cervical vertebral bodies. X-ray of hip/pelvis concerning for left pubic bone/pubic ramus fractures superimposed on old left inferior pubic ramus fractures. X-ray of left hand no fractures or dislocations.  TRH was called for further management given patient was unable to ambulate with 2+ assist.   Review of Systems:   In addition to the HPI above,  Constitutional:  No weight loss, night sweats, Fevers, chills, fatigue.  HEENT:  No headaches, Difficulty swallowing,Tooth/dental problems,Sore throat,  No sneezing, itching, ear ache, nasal congestion, post nasal drip,  Cardio-vascular:  No chest pain, Orthopnea, PND, swelling in lower extremities, anasarca, dizziness, palpitations  GI:  No heartburn, indigestion, abdominal pain, nausea, vomiting, diarrhea, change in bowel habits, loss of appetite  Resp:  No shortness of breath with exertion or at rest. No excess mucus, no productive cough, No non-productive cough, No coughing up of blood.No change in color of mucus.No wheezing.No chest wall deformity  Skin:  no rash or lesions.  GU:  no dysuria, change in color of urine, no urgency or frequency. No flank pain.  Musculoskeletal:  No joint pain or swelling. No decreased range of motion. No back pain.  Psych:  No change in mood or affect. No depression or anxiety. No memory loss.   A full 10 point Review of Systems was done, except as stated above, all other Review of Systems were negative.  Past Medical History:  Diagnosis Date   Anxiety    Back pain    Gait abnormality 11/14/2016   Memory difficulty 03/02/2019  Myelitis due to herpes simplex [facility])    Past Surgical History:  Procedure Laterality Date   ABDOMINAL HYSTERECTOMY     BLADDER REPAIR     CESAREAN SECTION     IR KYPHO LUMBAR INC FX REDUCE BONE BX UNI/BIL CANNULATION INC/IMAGING  04/11/2018   TUBAL  LIGATION     Social History:  reports that she has quit smoking. She has never used smokeless tobacco. She reports that she does not drink alcohol or use drugs.  Allergies  Allergen Reactions   Demerol [Meperidine] Other (See Comments)    Hallucinations   Percocet [Oxycodone-Acetaminophen] Itching   Amoxicillin-Pot Clavulanate Diarrhea    Severe pain, headache, intestinal infection   Penicillins Itching and Rash    Has patient had a PCN reaction causing immediate rash, facial/tongue/throat swelling, SOB or lightheadedness with hypotension:  NO Has patient had a PCN reaction causing severe rash involving mucus membranes or skin necrosis: No Has patient had a PCN reaction that required hospitalization: No Has patient had a PCN reaction occurring within the last 10 years: Yes If all of the above answers are "NO", then may proceed with Cephalosporin use.    Family History  Problem Relation Age of Onset   Hypertension Mother       Prior to Admission medications   Medication Sig Start Date End Date Taking? Authorizing Provider  acyclovir (ZOVIRAX) 400 MG tablet TAKE 1 TABLET BY MOUTH TWICE A DAY Patient taking differently: Take 400 mg by mouth in the morning and at bedtime.  07/13/19  Yes [name], MD  Calcium Carb-Cholecalciferol (CALCIUM 600-D PO) Take 1 tablet by mouth in the morning and at bedtime.   Yes [provider]  conjugated estrogens (PREMARIN) vaginal cream Place 1 Applicatorful vaginally daily as needed (itching).    Yes [provider]  Cyanocobalamin (VITAMIN B-12 PO) Take 1 tablet by mouth daily.   Yes [provider]  diazepam (VALIUM) 5 MG tablet Take 5 mg by mouth 2 (two) times daily. For muscle spasms   Yes [provider]  diclofenac Sodium (VOLTAREN) 1 % GEL Apply 1 application topically daily as needed (pain).   Yes [provider]  DULoxetine (CYMBALTA) 30 MG capsule TAKE 1 CAPSULE BY MOUTH EVERY  DAY Patient taking differently: Take 30 mg by mouth every morning.  09/07/19  Yes [name], MD  DULoxetine (CYMBALTA) 60 MG capsule TAKE 1 CAPSULE (60 MG TOTAL) BY MOUTH AT BEDTIME. 09/20/19  Yes [name], MD  HYDROcodone-acetaminophen [facility]) 10-325 MG tablet Take 1 tablet by mouth 2 (two) times daily as needed for severe pain.  03/07/18  Yes [provider]  Multiple Vitamins-Minerals (HAIR/SKIN/NAILS/BIOTIN) TABS Take 1 tablet by mouth daily.   Yes [provider]  omega-3 acid ethyl esters (LOVAZA) 1 g capsule Take 1 g by mouth daily.   Yes [provider]  pantoprazole (PROTONIX) 40 MG tablet Take 1 tablet (40 mg total) by mouth daily. Patient taking differently: Take 40 mg by mouth at bedtime.  10/26/16  Yes Angiulli, Lavon Paganini, PA-C  polyethylene glycol (MIRALAX / GLYCOLAX) packet Take 17 g by mouth daily. Patient taking differently: Take 17 g by mouth daily as needed (constipation).  10/26/16  Yes Angiulli, Lavon Paganini, PA-C  QUEtiapine (SEROQUEL) 25 MG tablet Take 1 tablet (25 mg total) by mouth at bedtime. 11/16/19  Yes [name], MD  sulfamethoxazole-trimethoprim (BACTRIM) 400-80 MG tablet Take 0.5 tablets by mouth daily.   Yes  [provider]  ipratropium-albuterol (DUONEB) 0.5-2.5 (3) MG/3ML SOLN Take 3 mLs by nebulization every 4 (four) hours as needed (For SOB and wheezing). Patient not taking: Reported on 12/06/2019 07/25/19 08/24/19  Dana Allan I, MD  pregabalin (LYRICA) 50 MG capsule Take 2 capsules (100 mg total) by mouth at bedtime. Patient not taking: Reported on 12/06/2019 11/16/19   [name], MD   Physical Exam: Vitals:   12/06/19 1600 12/06/19 1615 12/06/19 1630 12/06/19 1645  BP:    119/83  Pulse: 85 89 91 89  Resp: 17 19 15  (!) 23  Temp:      TempSrc:      SpO2: 98% 93% 94% 93%    Wt Readings from Last 3 Encounters:  07/23/19 68 kg  03/02/19 71.7 kg  07/24/18 63.5 kg    Constitutional normal  appearing elderly female Eyes: EOMI, anicteric, normal conjunctivae ENMT: Oropharynx with moist mucous membranes, normal dentition Cardiovascular: RRR no MRGs, with no peripheral edema Respiratory: Normal respiratory effort on 2 L, clear breath sounds  Abdomen: Soft,non-tender,  Musculoskeletal:  No joint deformity upper and lower extremities. Good ROM, no contractures. Normal muscle tone. Skin: Left hand laceration Neurologic: Alert, oriented x4.  Slow speech, often forgetful.  No focal deficits on examination, able to move all extremities though limited by pain in left lower extremity. Psychiatric:Appropriate affect, and mood. Mental status AAOx3          Labs on Admission:  Basic Metabolic Panel: Recent Labs  Lab 12/06/19 1605  NA 140  K 3.8  CL 101  CO2 29  GLUCOSE 106*  BUN 11  CREATININE 1.18*  CALCIUM 9.0   Liver Function Tests: Recent Labs  Lab 12/06/19 1605  AST 35  ALT 23  ALKPHOS 121  BILITOT 0.4  PROT 6.6  ALBUMIN 3.4*   No results for input(s): LIPASE, AMYLASE in the last 168 hours. No results for input(s): AMMONIA in the last 168 hours. CBC: Recent Labs  Lab 12/06/19 1605  WBC 11.7*  HGB 12.1  HCT 39.3  MCV 98.7  PLT 202   Cardiac Enzymes: No results for input(s): CKTOTAL, CKMB, CKMBINDEX, TROPONINI in the last 168 hours.  BNP (last 3 results) No results for input(s): BNP in the last 8760 hours.  ProBNP (last 3 results) No results for input(s): PROBNP in the last 8760 hours.  CBG: No results for input(s): GLUCAP in the last 168 hours.  Radiological Exams on Admission: CT Head Wo Contrast  Result Date: 12/06/2019 CLINICAL DATA:  [place]. Hit head. EXAM: CT HEAD WITHOUT CONTRAST CT CERVICAL SPINE WITHOUT CONTRAST TECHNIQUE: Multidetector CT imaging of the head and cervical spine was performed following the standard protocol without intravenous contrast. Multiplanar CT image reconstructions of the cervical spine were also generated. COMPARISON:   None. FINDINGS: CT HEAD FINDINGS Brain: Age related cerebral atrophy, ventriculomegaly and fairly extensive periventricular white matter disease. No extra-axial fluid collections are identified. No CT findings for acute hemispheric infarction or intracranial hemorrhage. No mass lesions. The brainstem and cerebellum are normal. Vascular: Moderate vascular calcifications. No aneurysm or hyperdense vessels. Skull: No skull fracture or bone lesions. Sinuses/Orbits: The paranasal sinuses and mastoid air cells are clear. Small amount of fluid in the right mastoid air cells. The globes are intact. Other: No scalp lesions or hematoma. CT CERVICAL SPINE FINDINGS Alignment: Mild reversal of the normal cervical lordosis. The cervical vertebral bodies are normally aligned. Skull base and vertebrae: Advanced degenerative changes noted at C1-2 but no  dens or other vertebral body fractures. The facets are normally aligned. No facet or laminar fractures. The skull base C1 and C1-2 articulations are maintained. Soft tissues and spinal canal: No prevertebral fluid or swelling. No visible canal hematoma. Disc levels: The spinal canal is fairly generous. No significant spinal or foraminal stenosis. Upper chest: The upper ribs are intact and the visualized lung apices are grossly clear. Other: No neck mass or adenopathy. IMPRESSION: 1. Age related cerebral atrophy, ventriculomegaly and fairly extensive periventricular white matter disease. 2. No acute intracranial findings or skull fracture. 3. Normal alignment of the cervical vertebral bodies and no acute cervical spine fracture. 4. Moderate degenerative cervical spondylosis with multilevel disc disease and facet disease. Electronically Signed   By: Marijo Sanes M.D.   On: 12/06/2019 15:34   CT Cervical Spine Wo Contrast  Result Date: 12/06/2019 CLINICAL DATA:  [place]. Hit head. EXAM: CT HEAD WITHOUT CONTRAST CT CERVICAL SPINE WITHOUT CONTRAST TECHNIQUE: Multidetector CT imaging of  the head and cervical spine was performed following the standard protocol without intravenous contrast. Multiplanar CT image reconstructions of the cervical spine were also generated. COMPARISON:  None. FINDINGS: CT HEAD FINDINGS Brain: Age related cerebral atrophy, ventriculomegaly and fairly extensive periventricular white matter disease. No extra-axial fluid collections are identified. No CT findings for acute hemispheric infarction or intracranial hemorrhage. No mass lesions. The brainstem and cerebellum are normal. Vascular: Moderate vascular calcifications. No aneurysm or hyperdense vessels. Skull: No skull fracture or bone lesions. Sinuses/Orbits: The paranasal sinuses and mastoid air cells are clear. Small amount of fluid in the right mastoid air cells. The globes are intact. Other: No scalp lesions or hematoma. CT CERVICAL SPINE FINDINGS Alignment: Mild reversal of the normal cervical lordosis. The cervical vertebral bodies are normally aligned. Skull base and vertebrae: Advanced degenerative changes noted at C1-2 but no dens or other vertebral body fractures. The facets are normally aligned. No facet or laminar fractures. The skull base C1 and C1-2 articulations are maintained. Soft tissues and spinal canal: No prevertebral fluid or swelling. No visible canal hematoma. Disc levels: The spinal canal is fairly generous. No significant spinal or foraminal stenosis. Upper chest: The upper ribs are intact and the visualized lung apices are grossly clear. Other: No neck mass or adenopathy. IMPRESSION: 1. Age related cerebral atrophy, ventriculomegaly and fairly extensive periventricular white matter disease. 2. No acute intracranial findings or skull fracture. 3. Normal alignment of the cervical vertebral bodies and no acute cervical spine fracture. 4. Moderate degenerative cervical spondylosis with multilevel disc disease and facet disease. Electronically Signed   By: Marijo Sanes M.D.   On: 12/06/2019 15:34    DG CHEST PORT 1 VIEW  Result Date: 12/06/2019 CLINICAL DATA:  Hypoxia. EXAM: PORTABLE CHEST 1 VIEW COMPARISON:  Radiograph and CT 07/23/2019 FINDINGS: The cardiomediastinal contours are normal. Minor bibasilar atelectasis. Pulmonary vasculature is normal. No consolidation, pleural effusion, or pneumothorax. No acute osseous abnormalities are seen. IMPRESSION: Minor bibasilar atelectasis. Electronically Signed   By: Keith Rake M.D.   On: 12/06/2019 18:45   DG Hand Complete Left  Result Date: 12/06/2019 CLINICAL DATA:  Fall, pain. EXAM: LEFT HAND - COMPLETE 3+ VIEW COMPARISON:  None. FINDINGS: Osseous alignment is normal. No fracture line or displaced fracture fragment is seen. Degenerative osteoarthritis at the first [facility] joint, moderate in degree with osteophyte formation. Additional degenerative changes at the second through fifth PIP joints, mild to moderate in degree. IMPRESSION: 1. No acute findings. No osseous fracture or dislocation seen.  2. Degenerative osteoarthritis, most prominent at the first [facility] joint. Electronically Signed   By: Franki Cabot M.D.   On: 12/06/2019 15:24   DG Hip Unilat W or Wo Pelvis 2-3 Views Left  Result Date: 12/06/2019 CLINICAL DATA:  Fall, pain EXAM: DG HIP (WITH OR WITHOUT PELVIS) 2-3V LEFT COMPARISON:  04/26/2017 FINDINGS: Old left inferior pubic ramus fracture noted. There is lucency throughout the superior pubic ramus and pubic bone on the left which could reflect superimposed acute fracture. No proximal femoral abnormality. Hip joints and SI joints are symmetric. IMPRESSION: Old left inferior pubic ramus fracture. Concern for superimposed left pubic bone and superior pubic ramus fractures. Electronically Signed   By: Rolm Baptise M.D.   On: 12/06/2019 15:22    EKG: Independently reviewed. normal EKG, normal sinus rhythm.  Assessment/Plan  Active Problems:   Encephalomyelitis   Neuropathic pain   Neurogenic bladder   Ataxia   Gait abnormality    Chronic pain syndrome   Pelvic fracture (HCC)   AKI (acute kidney injury) ([place])    Possible left pubic bone/superior pubic ramus fracture after fall.  Found on pelvic x-ray, no hip or SI joint involvement.  Has previous history of left inferior pubic ramus fracture.  Unable to walk even with 2 person assist -Continue home opioid regimen, add IV morphine 1 mg every 4 hours as needed severe pain --CT hip given XR says possible fracture -EDP discussed with Dr. Alvan Dame ([facility]) --weight bearing as tolerated and outpt follow in 4 weeks -PT/OT eval  Fall with history of recurrent falls related to sensory ataxia related to gait disorder from prior history of viral meningoencephalitis.  At baseline has good stability with a walker , currently unable to bear weight related to concern for pubic fracture.  Denied any preceding chest pain, shortness of breath, or other symptoms prior to fall. CT head and cervical spine nonacute -Monitor on telemetry -PT/OT eval  Hypoxia?  SPO2 documented as 87% on room air in ED, on 2 L with SPO2 greater than 92% on my examination, waveform was not the best.  Patient without any cough, normal respiratory effort, no respiratory symptoms. -Obtain chest x-ray--ADDENDUM: showed atelectasis, will add incentive spirometry -Wean O2 as able, goal SPO2 greater than 92% -Covid test pending --duonebs PRN  AKI, mild suspect prerenal etiology.  Baseline creatinine 0.6-0.8.  1.18 on admission.  Patient looks a bit dry on examination, likely prerenal.  Does have reported history of neurogenic bladder, has been reports patient is incontinent at at baseline but does not need catheterizations -IV fluids time for 24 hours, reassess with BMP in a.m. -Avoid nephrotoxins, monitor output  Leukocytosis, suspect reactive in the setting of recent fall and concern for fracture.  Remains afebrile with no localizing signs or symptoms of infection -Continue trending daily CBC -Check chest  x-ray as above -Blood cultures if becomes febrile  Left hand laceration X-ray showed no fracture dislocation.  No signs of infection on examination -Suturing by EDP  Chronic urinary retention with concern for neurogenic bladder in the past.  Has history of recurrent UTIs.  Denies any dysuria, malodorous urine or changes in color in urine -Continue home low-dose Bactrim for UTI prophylaxis --bladder scan to ensure no retention -Holding off on UA given history of recurrent UTIs and no current symptoms -Monitor output -Followed by urology, [facility]  History of HSV and encephalomyelitis(dx'd 2017),stable No new confusion -Continue chronic acyclovir  Chronic pain/peripheral neuropathy -home Lyrica -Home norco BID PRN  Depression/anxiety, stable -Continue home diazepam/Cymbalta  Insomnia -Continue nightly home Seroquel  Memory difficulty Alert oriented x4.  CT head with no acute disease, does show age-related cerebral atrophy, ventriculomegaly -Delirium precautions  GERD, stable --Home PPI   DVT Prophylaxis Heparin  AM Labs Ordered, also please review Full Orders  Family Communication: Admission, patients condition and plan of care including tests being ordered have been discussed with the patient and husband who indicate understanding and agree with the plan and Code Status.  Code Status DNR (do not want CPR or intubation for cardiac arrest), okay with intubation if breathing becomes worse  Likely DC to to be determined based on PT/OT evaluations  Condition stable  Consults called: ED physician to call orthopedics, discussed with Dr. Paralee Cancel [facility], follow up in 4 weeks)  Admission status: Observation    Desiree Hane M.D on 12/06/2019 at 7:33 PM  To page go to [email or web address] - password [email or web address]   If 7PM-7AM, please contact night-coverage [email or web address] Password [facility]  12/06/2019, 7:33 PM

## 2019-12-06 NOTE — ED Provider Notes (Signed)
  Physical Exam  BP 107/82   Pulse 93   Temp 98.8 F (37.1 C) (Oral)   Resp 15   SpO2 92%   Physical Exam  ED Course/Procedures     Procedures  MDM   Assuming care of patient from Dr. Jeanell Sparrow.   Patient in the ED for fall and hip pain. Workup thus far shows imaging (CT and radiographs) that are reassuring.  Concerning findings are as following  Important pending results are labs/blood work.  According to Dr. Jeanell Sparrow, plan is to f/u on labs. Pt unable to walk. Has pubic rami fracture on Xrays. Will need admission.   Patient had no complains, no concerns from the nursing side. Will continue to monitor.    Varney Biles, MD 12/06/19 1549

## 2019-12-06 NOTE — ED Triage Notes (Signed)
Pt bib ems after falling. Pt was sitting on a bar stool and fell onto her hip. Pt did hit her head on the wall. Arrives in [place] denies neck or back pain. Lac to L hand, bleeding controlled. L hip pain. No shortening or rotation noted. Alert and oriented X4. No LOC, no blood thinners.  110/80 HR 100 RR 22

## 2019-12-07 ENCOUNTER — Other Ambulatory Visit: Payer: Self-pay

## 2019-12-07 ENCOUNTER — Encounter (HOSPITAL_COMMUNITY): Payer: Self-pay | Admitting: Internal Medicine

## 2019-12-07 DIAGNOSIS — R269 Unspecified abnormalities of gait and mobility: Secondary | ICD-10-CM | POA: Diagnosis not present

## 2019-12-07 DIAGNOSIS — F329 Major depressive disorder, single episode, unspecified: Secondary | ICD-10-CM | POA: Diagnosis present

## 2019-12-07 DIAGNOSIS — S3282XA Multiple fractures of pelvis without disruption of pelvic ring, initial encounter for closed fracture: Secondary | ICD-10-CM | POA: Diagnosis not present

## 2019-12-07 DIAGNOSIS — J9601 Acute respiratory failure with hypoxia: Secondary | ICD-10-CM | POA: Diagnosis present

## 2019-12-07 DIAGNOSIS — Y92009 Unspecified place in unspecified non-institutional (private) residence as the place of occurrence of the external cause: Secondary | ICD-10-CM | POA: Diagnosis not present

## 2019-12-07 DIAGNOSIS — W08XXXA Fall from other furniture, initial encounter: Secondary | ICD-10-CM | POA: Diagnosis present

## 2019-12-07 DIAGNOSIS — R0902 Hypoxemia: Secondary | ICD-10-CM | POA: Diagnosis not present

## 2019-12-07 DIAGNOSIS — Z792 Long term (current) use of antibiotics: Secondary | ICD-10-CM | POA: Diagnosis not present

## 2019-12-07 DIAGNOSIS — R339 Retention of urine, unspecified: Secondary | ICD-10-CM | POA: Diagnosis present

## 2019-12-07 DIAGNOSIS — Z20822 Contact with and (suspected) exposure to covid-19: Secondary | ICD-10-CM | POA: Diagnosis present

## 2019-12-07 DIAGNOSIS — Z8661 Personal history of infections of the central nervous system: Secondary | ICD-10-CM | POA: Diagnosis not present

## 2019-12-07 DIAGNOSIS — Z90710 Acquired absence of both cervix and uterus: Secondary | ICD-10-CM | POA: Diagnosis not present

## 2019-12-07 DIAGNOSIS — W19XXXA Unspecified fall, initial encounter: Secondary | ICD-10-CM | POA: Diagnosis not present

## 2019-12-07 DIAGNOSIS — R270 Ataxia, unspecified: Secondary | ICD-10-CM | POA: Diagnosis not present

## 2019-12-07 DIAGNOSIS — F419 Anxiety disorder, unspecified: Secondary | ICD-10-CM | POA: Diagnosis present

## 2019-12-07 DIAGNOSIS — Z87891 Personal history of nicotine dependence: Secondary | ICD-10-CM | POA: Diagnosis not present

## 2019-12-07 DIAGNOSIS — Z66 Do not resuscitate: Secondary | ICD-10-CM | POA: Diagnosis present

## 2019-12-07 DIAGNOSIS — R296 Repeated falls: Secondary | ICD-10-CM | POA: Diagnosis present

## 2019-12-07 DIAGNOSIS — N179 Acute kidney failure, unspecified: Secondary | ICD-10-CM | POA: Diagnosis present

## 2019-12-07 DIAGNOSIS — G894 Chronic pain syndrome: Secondary | ICD-10-CM | POA: Diagnosis present

## 2019-12-07 DIAGNOSIS — K219 Gastro-esophageal reflux disease without esophagitis: Secondary | ICD-10-CM | POA: Diagnosis present

## 2019-12-07 DIAGNOSIS — Z79899 Other long term (current) drug therapy: Secondary | ICD-10-CM | POA: Diagnosis not present

## 2019-12-07 DIAGNOSIS — G4700 Insomnia, unspecified: Secondary | ICD-10-CM | POA: Diagnosis present

## 2019-12-07 DIAGNOSIS — S32409A Unspecified fracture of unspecified acetabulum, initial encounter for closed fracture: Secondary | ICD-10-CM | POA: Diagnosis not present

## 2019-12-07 DIAGNOSIS — N319 Neuromuscular dysfunction of bladder, unspecified: Secondary | ICD-10-CM | POA: Diagnosis present

## 2019-12-07 DIAGNOSIS — S32592A Other specified fracture of left pubis, initial encounter for closed fracture: Secondary | ICD-10-CM | POA: Diagnosis present

## 2019-12-07 DIAGNOSIS — G629 Polyneuropathy, unspecified: Secondary | ICD-10-CM | POA: Diagnosis present

## 2019-12-07 DIAGNOSIS — S61412A Laceration without foreign body of left hand, initial encounter: Secondary | ICD-10-CM | POA: Diagnosis present

## 2019-12-07 DIAGNOSIS — G0490 Encephalitis and encephalomyelitis, unspecified: Secondary | ICD-10-CM | POA: Diagnosis not present

## 2019-12-07 DIAGNOSIS — M25552 Pain in left hip: Secondary | ICD-10-CM | POA: Diagnosis present

## 2019-12-07 DIAGNOSIS — D72829 Elevated white blood cell count, unspecified: Secondary | ICD-10-CM | POA: Diagnosis present

## 2019-12-07 LAB — BASIC METABOLIC PANEL
Anion gap: 12 (ref 5–15)
BUN: 12 mg/dL (ref 8–23)
CO2: 27 mmol/L (ref 22–32)
Calcium: 8.4 mg/dL — ABNORMAL LOW (ref 8.9–10.3)
Chloride: 101 mmol/L (ref 98–111)
Creatinine, Ser: 1.06 mg/dL — ABNORMAL HIGH (ref 0.44–1.00)
GFR calc Af Amer: >60 mL/min (ref >60)
GFR calc non Af Amer: 52 mL/min — ABNORMAL LOW (ref >60)
Glucose, Bld: 100 mg/dL — ABNORMAL HIGH (ref 70–99)
Potassium: 3.9 mmol/L (ref 3.5–5.1)
Sodium: 140 mmol/L (ref 135–145)

## 2019-12-07 LAB — CBC
HCT: 35 % — ABNORMAL LOW (ref 36.0–46.0)
Hemoglobin: 11.2 g/dL — ABNORMAL LOW (ref 12.0–15.0)
MCH: 31.2 pg (ref 26.0–34.0)
MCHC: 32 g/dL (ref 30.0–36.0)
MCV: 97.5 fL (ref 80.0–100.0)
Platelets: 182 10*3/uL (ref 150–400)
RBC: 3.59 MIL/uL — ABNORMAL LOW (ref 3.87–5.11)
RDW: 13.4 % (ref 11.5–15.5)
WBC: 7.8 10*3/uL (ref 4.0–10.5)
nRBC: 0 % (ref 0.0–0.2)

## 2019-12-07 NOTE — Progress Notes (Signed)
PROGRESS NOTE    Katelyn Lamb  [id number] DOB: Jan 30, 1946 DOA: 12/06/2019 PCP: [name], MD    Brief Narrative:  Katelyn Lamb is a 74 y.o. 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  Patient apparently was at son's house for [facility] and sitting on high barstool in the kitchen when she suddenly fell from the chair. Patient landed on her rear and hit the left side of her head on the wall behind her.  Family was present during the fall. She has long history of recurrent falls that has been ongoing and presumed related to sensory ataxia as a residual side effect from viral meningoencephalitis (diagnosed 2017). She uses a walker at baseline. She has chronic pain patient takes opioid as needed for that as well as Lyrica, no changes in medications recently. Regarding viral encephalomyelitis, patient is on chronic acyclovir.  T-max 98.8, respiratory rate range 17-23, document SPO2 of 87% on room air, on 2 L during examination, hemodynamically stable. In ED WBC 11.7, creatinine 1.18.  Covid test negative. CT head negative for acute intracranial abnormalities, CT cervical spine showed normal alignment of cervical vertebral bodies. X-ray of hip/pelvis concerning for left pubic bone/pubic ramus fractures superimposed on old left inferior pubic ramus fractures. X-ray of left hand no fractures or dislocations.   Assessment & Plan:   Active Problems:   Encephalomyelitis   Neuropathic pain   Neurogenic bladder   Ataxia   Gait abnormality   Chronic pain syndrome   Pelvic fracture (HCC)   AKI (acute kidney injury) ([place])  Acute fracture left superior and inferior pubic rami and fracture left pubic bone after fall.  Found on pelvic  x-ray, and on CT of the pelvis.  Has previous history of left inferior pubic ramus fracture.  Unable to walk even with 2 person assist -Continue home opioid regimen, morphine IV as needed severe pain -EDP discussed with Dr. Alvan Dame ([facility]) --weight bearing as tolerated and outpt follow in 4 weeks -PT/OT eval  Fall with history of recurrent falls related to sensory ataxia related to gait disorder from prior history of viral meningoencephalitis.  At baseline has good stability with a walker , currently unable to bear weight related to pain from pubic fracture.  CT head and cervical spine nonacute -Monitor on telemetry -PT/OT eval  Hypoxia?  SPO2 documented as 87% on room air in ED, on 2 L with SPO2 greater than 92%. Patient without any cough, normal respiratory effort, no respiratory symptoms. -Chest x-ray showed atelectasis, ordered incentive spirometry -Wean O2 as able, goal SPO2 greater than 92% -Covid test negative --duonebs PRN  AKI, mild suspect prerenal etiology.  Baseline creatinine 0.6-0.8.  1.18 on admission. Likely prerenal.  Does have reported history of neurogenic bladder, has been reports patient is incontinent at at baseline but does not need catheterizations - Continue IV fluids. BMP in a.m. -Avoid nephrotoxins, monitor output  Leukocytosis, suspect reactive in the setting of recent fall and concern for fracture.  Remains afebrile with no localizing signs or symptoms of infection -Continue trending daily CBC -Chest x-ray as above -Blood cultures if becomes febrile  Left hand laceration X-ray showed no fracture dislocation.  No signs of infection on examination -Suturing by EDP  Chronic urinary retention with concern for neurogenic bladder in the  past.  Has history of recurrent UTIs.  Denies any dysuria, malodorous urine or changes in color in urine -Continue home low-dose Bactrim for UTI prophylaxis -- Holding off on UA given history of recurrent UTIs and no  current symptoms -Monitor output -Followed [facility] urology, [facility]  History of HSV and encephalomyelitis(dx'd 2017),stable No new confusion -Continue chronic acyclovir  Chronic pain/peripheral neuropathy -home Lyrica -Home norco BID PRN  Depression/anxiety, stable -Continue home diazepam/Cymbalta  Insomnia -Continue nightly home Seroquel  Memory difficulty Currently alert and oriented.  CT head with no acute disease, does show age-related cerebral atrophy, ventriculomegaly -Delirium precautions  GERD, stable --Home PPI  DVT Prophylaxis Heparin  Family Communication: Discussed with husband Code Status DNR (do not want CPR or intubation for cardiac arrest), okay with intubation if breathing becomes worse   Antimicrobials:   Bactrim suppression    Subjective: She is complaining of pelvic pain.  Objective: Vitals:   12/07/19 0458 12/07/19 0840 12/07/19 1250 12/07/19 1330  BP:  98/69 (!) 125/54 106/67  Pulse:  83 88 83  Resp: 16 17 17 18   Temp:  98.2 F (36.8 C) 98.9 F (37.2 C) 98.5 F (36.9 C)  TempSrc:  Oral Oral Oral  SpO2:  91% 91% 91%  Weight:      Height:        Intake/Output Summary (Last 24 hours) at 12/07/2019 1746 Last data filed at 12/07/2019 0415 Gross per 24 hour  Intake 638.67 ml  Output 300 ml  Net 338.67 ml   Filed Weights   12/06/19 2100  Weight: 72.4 kg    Examination: General exam: Awake and oriented, complaining of pelvic pain but not in any acute distress at this time Respiratory system: Decreased breath sounds lower lobes otherwise clear to auscultation. Cardiovascular system: S1 & S2 heard, RRR. No JVD, murmurs, rubs, gallops or clicks. No pedal edema. Gastrointestinal system: Abdomen is nondistended, soft and nontender. Normal bowel sounds heard. Central nervous system: Alert and oriented.  Limited range of motion left lower extremity due to pain otherwise no focal neurological deficits. Extremities: Symmetric 5 x 5  power. Skin: Left hand laceration, no rashes Psychiatry: Mood & affect appropriate.     Data Reviewed: I have personally reviewed following labs and imaging studies  CBC: Recent Labs  Lab 12/06/19 1605 12/07/19 0355  WBC 11.7* 7.8  HGB 12.1 11.2*  HCT 39.3 35.0*  MCV 98.7 97.5  PLT 202 [id number]   Basic Metabolic Panel: Recent Labs  Lab 12/06/19 1605 12/07/19 0355  NA 140 140  K 3.8 3.9  CL 101 101  CO2 29 27  GLUCOSE 106* 100*  BUN 11 12  CREATININE 1.18* 1.06*  CALCIUM 9.0 8.4*   GFR: Estimated Creatinine Clearance: 47.9 mL/min (A) (by C-G formula based on SCr of 1.06 mg/dL (H)). Liver Function Tests: Recent Labs  Lab 12/06/19 1605  AST 35  ALT 23  ALKPHOS 121  BILITOT 0.4  PROT 6.6  ALBUMIN 3.4*   No results for input(s): LIPASE, AMYLASE in the last 168 hours. No results for input(s): AMMONIA in the last 168 hours. Coagulation Profile: No results for input(s): INR, PROTIME in the last 168 hours. Cardiac Enzymes: No results for input(s): CKTOTAL, CKMB, CKMBINDEX, TROPONINI in the last 168 hours. BNP (last 3 results) No results for input(s): PROBNP in the last 8760 hours. HbA1C: No results for input(s): HGBA1C in the last 72 hours. CBG: No results for input(s): GLUCAP in the last 168 hours. Lipid Profile: No results  for input(s): CHOL, HDL, LDLCALC, TRIG, CHOLHDL, LDLDIRECT in the last 72 hours. Thyroid Function Tests: No results for input(s): TSH, T4TOTAL, FREET4, T3FREE, THYROIDAB in the last 72 hours. Anemia Panel: No results for input(s): VITAMINB12, FOLATE, FERRITIN, TIBC, IRON, RETICCTPCT in the last 72 hours. Sepsis Labs: No results for input(s): PROCALCITON, LATICACIDVEN in the last 168 hours.  Recent Results (from the past 240 hour(s))  SARS CORONAVIRUS 2 (TAT 6-24 HRS) Nasopharyngeal Nasopharyngeal Swab     Status: None   Collection Time: 12/06/19  3:59 PM   Specimen: Nasopharyngeal Swab  Result Value Ref Range Status   SARS Coronavirus 2  NEGATIVE NEGATIVE Final    Comment: (NOTE) SARS-CoV-2 target nucleic acids are NOT DETECTED. The SARS-CoV-2 RNA is generally detectable in upper and lower respiratory specimens during the acute phase of infection. Negative results do not preclude SARS-CoV-2 infection, do not rule out co-infections with other pathogens, and should not be used as the sole basis for treatment or other patient management decisions. Negative results must be combined with clinical observations, patient history, and epidemiological information. The expected result is Negative. Fact Sheet for Patients: SugarRoll.be Fact Sheet for Healthcare Providers: https://www.woods-mathews.com/ This test is not yet approved or cleared by the [place] FDA and  has been authorized for detection and/or diagnosis of SARS-CoV-2 by FDA under an Emergency Use Authorization (EUA). This EUA will remain  in effect (meaning this test can be used) for the duration of the COVID-19 declaration under Section 56 4(b)(1) of the Act, 21 U.S.C. section 360bbb-3(b)(1), unless the authorization is terminated or revoked sooner. Performed at [facility], [address], [place], [place] [address]          Radiology Studies: CT Head Wo Contrast  Result Date: 12/06/2019 CLINICAL DATA:  [place]. Hit head. EXAM: CT HEAD WITHOUT CONTRAST CT CERVICAL SPINE WITHOUT CONTRAST TECHNIQUE: Multidetector CT imaging of the head and cervical spine was performed following the standard protocol without intravenous contrast. Multiplanar CT image reconstructions of the cervical spine were also generated. COMPARISON:  None. FINDINGS: CT HEAD FINDINGS Brain: Age related cerebral atrophy, ventriculomegaly and fairly extensive periventricular white matter disease. No extra-axial fluid collections are identified. No CT findings for acute hemispheric infarction or intracranial hemorrhage. No mass lesions. The brainstem  and cerebellum are normal. Vascular: Moderate vascular calcifications. No aneurysm or hyperdense vessels. Skull: No skull fracture or bone lesions. Sinuses/Orbits: The paranasal sinuses and mastoid air cells are clear. Small amount of fluid in the right mastoid air cells. The globes are intact. Other: No scalp lesions or hematoma. CT CERVICAL SPINE FINDINGS Alignment: Mild reversal of the normal cervical lordosis. The cervical vertebral bodies are normally aligned. Skull base and vertebrae: Advanced degenerative changes noted at C1-2 but no dens or other vertebral body fractures. The facets are normally aligned. No facet or laminar fractures. The skull base C1 and C1-2 articulations are maintained. Soft tissues and spinal canal: No prevertebral fluid or swelling. No visible canal hematoma. Disc levels: The spinal canal is fairly generous. No significant spinal or foraminal stenosis. Upper chest: The upper ribs are intact and the visualized lung apices are grossly clear. Other: No neck mass or adenopathy. IMPRESSION: 1. Age related cerebral atrophy, ventriculomegaly and fairly extensive periventricular white matter disease. 2. No acute intracranial findings or skull fracture. 3. Normal alignment of the cervical vertebral bodies and no acute cervical spine fracture. 4. Moderate degenerative cervical spondylosis with multilevel disc disease and facet disease. Electronically Signed  By: Marijo Sanes M.D.   On: 12/06/2019 15:34   CT Cervical Spine Wo Contrast  Result Date: 12/06/2019 CLINICAL DATA:  [place]. Hit head. EXAM: CT HEAD WITHOUT CONTRAST CT CERVICAL SPINE WITHOUT CONTRAST TECHNIQUE: Multidetector CT imaging of the head and cervical spine was performed following the standard protocol without intravenous contrast. Multiplanar CT image reconstructions of the cervical spine were also generated. COMPARISON:  None. FINDINGS: CT HEAD FINDINGS Brain: Age related cerebral atrophy, ventriculomegaly and fairly  extensive periventricular white matter disease. No extra-axial fluid collections are identified. No CT findings for acute hemispheric infarction or intracranial hemorrhage. No mass lesions. The brainstem and cerebellum are normal. Vascular: Moderate vascular calcifications. No aneurysm or hyperdense vessels. Skull: No skull fracture or bone lesions. Sinuses/Orbits: The paranasal sinuses and mastoid air cells are clear. Small amount of fluid in the right mastoid air cells. The globes are intact. Other: No scalp lesions or hematoma. CT CERVICAL SPINE FINDINGS Alignment: Mild reversal of the normal cervical lordosis. The cervical vertebral bodies are normally aligned. Skull base and vertebrae: Advanced degenerative changes noted at C1-2 but no dens or other vertebral body fractures. The facets are normally aligned. No facet or laminar fractures. The skull base C1 and C1-2 articulations are maintained. Soft tissues and spinal canal: No prevertebral fluid or swelling. No visible canal hematoma. Disc levels: The spinal canal is fairly generous. No significant spinal or foraminal stenosis. Upper chest: The upper ribs are intact and the visualized lung apices are grossly clear. Other: No neck mass or adenopathy. IMPRESSION: 1. Age related cerebral atrophy, ventriculomegaly and fairly extensive periventricular white matter disease. 2. No acute intracranial findings or skull fracture. 3. Normal alignment of the cervical vertebral bodies and no acute cervical spine fracture. 4. Moderate degenerative cervical spondylosis with multilevel disc disease and facet disease. Electronically Signed   By: Marijo Sanes M.D.   On: 12/06/2019 15:34   CT PELVIS WO CONTRAST  Result Date: 12/06/2019 CLINICAL DATA:  Possible pelvic fractures on plain films EXAM: CT PELVIS WITHOUT CONTRAST TECHNIQUE: Multidetector CT imaging of the pelvis was performed following the standard protocol without intravenous contrast. COMPARISON:  Plain films  earlier today FINDINGS: Urinary Tract:  No abnormality visualized. Bowel:  Unremarkable visualized pelvic bowel loops. Vascular/Lymphatic: Aortoiliac atherosclerosis.  No adenopathy. Reproductive:  Prior hysterectomy.  No adnexal masses. Other:  No free fluid or free air. Musculoskeletal: Old healed left inferior pubic ramus fracture noted. There is also acute fractures involving the left superior pubic ramus and left pubic bone best seen on coronal imaging, and left inferior pubic ramus best seen on axial images. No proximal femoral abnormality. No subluxation or dislocation. Prior vertebral augmentation at L5. IMPRESSION: Acute fractures through the left superior and inferior pubic rami as well as left pubic bone. Old healed left inferior pubic ramus fracture. Electronically Signed   By: Rolm Baptise M.D.   On: 12/06/2019 22:10   DG CHEST PORT 1 VIEW  Result Date: 12/06/2019 CLINICAL DATA:  Hypoxia. EXAM: PORTABLE CHEST 1 VIEW COMPARISON:  Radiograph and CT 07/23/2019 FINDINGS: The cardiomediastinal contours are normal. Minor bibasilar atelectasis. Pulmonary vasculature is normal. No consolidation, pleural effusion, or pneumothorax. No acute osseous abnormalities are seen. IMPRESSION: Minor bibasilar atelectasis. Electronically Signed   By: Keith Rake M.D.   On: 12/06/2019 18:45   DG Hand Complete Left  Result Date: 12/06/2019 CLINICAL DATA:  Fall, pain. EXAM: LEFT HAND - COMPLETE 3+ VIEW COMPARISON:  None. FINDINGS: Osseous alignment is normal. No fracture  line or displaced fracture fragment is seen. Degenerative osteoarthritis at the first [facility] joint, moderate in degree with osteophyte formation. Additional degenerative changes at the second through fifth PIP joints, mild to moderate in degree. IMPRESSION: 1. No acute findings. No osseous fracture or dislocation seen. 2. Degenerative osteoarthritis, most prominent at the first [facility] joint. Electronically Signed   By: Franki Cabot M.D.   On: 12/06/2019  15:24   DG Hip Unilat W or Wo Pelvis 2-3 Views Left  Result Date: 12/06/2019 CLINICAL DATA:  Fall, pain EXAM: DG HIP (WITH OR WITHOUT PELVIS) 2-3V LEFT COMPARISON:  04/26/2017 FINDINGS: Old left inferior pubic ramus fracture noted. There is lucency throughout the superior pubic ramus and pubic bone on the left which could reflect superimposed acute fracture. No proximal femoral abnormality. Hip joints and SI joints are symmetric. IMPRESSION: Old left inferior pubic ramus fracture. Concern for superimposed left pubic bone and superior pubic ramus fractures. Electronically Signed   By: Rolm Baptise M.D.   On: 12/06/2019 15:22        Scheduled Meds: . acyclovir  400 mg Oral BID  . diazepam  5 mg Oral BID  . DULoxetine  30 mg Oral q morning - 10a  . DULoxetine  60 mg Oral QHS  . heparin  5,000 Units Subcutaneous Q8H  . lidocaine-EPINEPHrine  10 mL Infiltration Once  . pantoprazole  40 mg Oral QHS  . polyethylene glycol  17 g Oral Daily  . pregabalin  100 mg Oral QHS  . QUEtiapine  25 mg Oral QHS  . sulfamethoxazole-trimethoprim  0.5 tablet Oral Daily   Continuous Infusions: . sodium chloride 75 mL/hr at 12/06/19 2256     LOS: 0 days     Yaakov Guthrie, MD [facility]   To contact the attending provider between 7A-7P or the covering provider during after hours 7P-7A, please log into the web site [email or web address] and access using universal [place] password for that web site. If you do not have the password, please call the hospital operator.  12/07/2019, 5:46 PM

## 2019-12-07 NOTE — Progress Notes (Signed)
..  Laceration Repair Performed by: Lorin Glass, PA-C Authorized by: Lorin Glass, PA-C   Consent:    Consent obtained:  Verbal   Consent given by:  Patient   Risks discussed:  Infection, need for additional repair, poor cosmetic result, pain, retained foreign body, tendon damage, vascular damage, poor wound healing and nerve damage   Alternatives discussed:  No treatment and referral (Alternative wound closures) Anesthesia (see MAR for exact dosages):    Anesthesia method:  Local infiltration   Local anesthetic:  Lidocaine 2% WITH epi Laceration details:    Location:  Hand   Hand location:  L hand, dorsum   Length (cm):  4 Repair type:    Repair type:  Simple Pre-procedure details:    Preparation:  Patient was prepped and draped in usual sterile fashion and imaging obtained to evaluate for foreign bodies Exploration:    Hemostasis achieved with:  Epinephrine and direct pressure   Wound exploration: wound explored through full range of motion and entire depth of wound probed and visualized     Wound extent: no fascia violation noted and no underlying fracture noted   Treatment:    Area cleansed with:  Saline and Betadine   Amount of cleaning:  Extensive   Irrigation solution:  Sterile saline Skin repair:    Repair method:  Sutures and Steri-Strips   Suture size:  5-0   Suture material:  Prolene   Suture technique:  Simple interrupted   Number of sutures:  4   Number of Steri-Strips:  3 Approximation:    Approximation:  Close Post-procedure details:    Dressing:  Non-adherent dressing   Patient tolerance of procedure:  Tolerated well, no immediate complications

## 2019-12-07 NOTE — Progress Notes (Signed)
Rehab Admissions Coordinator Note:  Per PT recommendation, patient was screened by Michel Santee for appropriateness for an Inpatient Acute Rehab Consult.  Noted pt is observation status at this time. Pt may not have the medical necessity to warrant an inpatient rehab stay if they remain in observation. If pt were to qualify for inpatient status, AC will screen for candidacy.    Michel Santee 12/07/2019, 1:00 PM  I can be reached at [phone].

## 2019-12-07 NOTE — Evaluation (Signed)
Physical Therapy Evaluation Patient Details Name: Katelyn Lamb MRN: [id number] DOB: 02-01-1946 Today's Date: 12/07/2019   History of Present Illness  Katelyn Lamb is a 74 y.o. 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 Imaging: CT head negative for acute intracranial abnormalities, CT cervical spine showed normal alignment of cervical vertebral bodies. X-ray of left hand no fractures or dislocations.  Clinical Impression  PTA patient required intermittent assistance from husband for pericare (other ADLs behind patient) and for safety with transfers/gait. Pt was household ambulatory with RW prior to the fall. Currently patient requires mod-maxA for bed mobility secondary to difficulty scooting and with trunk management (tendency for posterior weight shift). She demonstrates inability to lift feet off the ground during transfers/gait requiring scoot pivot transfers. Pt demonstrates grossly 4/5 LE strength in sitting, but not functional in standing currently. Due to current level of care required for transfers, ADLs, prior level of function, and caregiver support avaliable following discharge, recommend patient attend rehab prior to discharge. Recommend CIR due to motivation/partcipation ability. PT will continue following acutely while admitted.    Follow Up Recommendations CIR(vs SNF if deemed not eligible)    Equipment Recommendations  Other (comment)(TBD at next venue of care)    Recommendations for Other Services Rehab consult     Precautions / Restrictions Precautions Precautions: None Restrictions Weight Bearing Restrictions: Yes Other Position/Activity Restrictions: WBAT       Mobility  Bed Mobility Overal bed mobility: Needs Assistance Bed Mobility: Supine to Sit     Supine to sit: Mod assist;Max assist;HOB elevated     General bed mobility comments: Pt required mod-maxA for trunk control/scoot to EOB during bed mobility this morning. Required multiple VCs for sequencing for assistance but unable to scoot once sitting up, requiring maxA to get EOB.  Transfers Overall transfer level: Needs assistance Equipment used: Rolling walker (2 wheeled) Transfers: Sit to/from [facility] Sit to Stand: Min assist;Mod assist;From elevated surface Stand pivot transfers: Min assist;Mod assist;From elevated surface       General transfer comment: Pt requires min-modA for standing balance requiring constant VCs for upright posture, improved weight shift foward (due to posterior push), and rotation of feet (unable to pick up       Balance Overall balance assessment: Needs assistance Sitting-balance support: Bilateral upper extremity supported Sitting balance-Leahy Scale: Fair     Standing balance support: Bilateral upper extremity supported Standing balance-Leahy Scale: Poor Standing balance comment: Requires BUE support         Pertinent Vitals/Pain Pain Assessment: 0-10 Pain Score: 3  Pain Location: R hip Pain Descriptors / Indicators: Aching;Dull;Guarding;Grimacing Pain Intervention(s): Premedicated before session;Monitored during session;Limited activity within patient's tolerance    Home Living Family/patient expects to be discharged to:: Private residence Living Arrangements: Spouse/significant other Available Help at Discharge: Available 24 hours/day;Family Type of Home: House Home Access: Stairs to enter Entrance Stairs-Rails: None Entrance Stairs-Number of Steps: 2 Home Layout: Two level;Able to live on main level with bedroom/bathroom Home Equipment: Gilford Rile - 2 wheels;Bedside commode;Wheelchair - manual;Tub  bench Additional Comments: spouse assist as needed    Prior Function Level of Independence: Needs assistance   Gait / Transfers Assistance Needed: occasional use of RW as needed and support from spouse  ADL's / Homemaking Assistance Needed: pt states her husband has to still help her with peri care/ husband manages IADLs  Comments: Pt states she cannot do things behind her back, requiring husband assistance.      Hand Dominance   Dominant Hand: Right    Extremity/Trunk Assessment   Upper Extremity Assessment Upper Extremity Assessment: Defer to OT evaluation    Lower Extremity Assessment Lower Extremity Assessment: Generalized weakness(Able to perform 4/5 gross strength in sitting)       Communication   Communication: No difficulties;HOH  Cognition Arousal/Alertness: Lethargic Behavior During Therapy: Flat affect Overall Cognitive Status: Impaired/Different from baseline Area of Impairment: Orientation       Orientation Level: Disoriented to;Time       General Comments General comments (skin integrity, edema, etc.): VSS throughout. HR baseline at 89, increased to 109 following transfer with quick return to baseline with sitting.     Exercises General Exercises - Lower Extremity Ankle Circles/Pumps: AROM;Both;15 reps Long Arc Quad: AROM;Both;15 reps Hip Flexion/Marching: AROM;10 reps;Seated Toe Raises: AROM;Both;10 reps;Seated Heel Raises: AROM;Both;10 reps;Seated   Assessment/Plan    PT Assessment Patient needs continued PT services  PT Problem List Decreased strength;Decreased range of motion;Decreased activity tolerance;Decreased mobility;Decreased coordination;Decreased knowledge of use of DME;Decreased balance;Decreased safety awareness;Decreased knowledge of precautions;Pain       PT Treatment Interventions DME instruction;Gait training;Functional mobility training;Therapeutic activities;Therapeutic exercise;Balance training;Neuromuscular  re-education;Cognitive remediation;Patient/family education;Modalities    PT Goals (Current goals can be found in the Care Plan section)  Acute Rehab PT Goals Patient Stated Goal: Go to rehab to get stronger to return home PT Goal Formulation: With patient Time For Goal Achievement: 12/21/19 Potential to Achieve Goals: Fair    Frequency Min 3X/week   Barriers to discharge Decreased caregiver support Husband is only caregiver, unable to provide>modA for functional transfers.       AM-PAC PT "6 Clicks" Mobility  Outcome Measure Help needed turning from your back to your side while in a flat bed without using bedrails?: A Lot Help needed moving from lying on your back to sitting on the side of a flat bed without using bedrails?: A Lot Help needed moving to and from a bed to a chair (including a wheelchair)?: A Lot Help needed standing up from a chair using your arms (e.g., wheelchair or bedside chair)?: A Lot Help needed to walk in hospital room?: A Lot Help needed climbing 3-5 steps with a railing? : Total 6 Click Score: 11    End of Session Equipment Utilized During Treatment: Gait belt Activity Tolerance: Patient tolerated treatment well Patient left: in chair;with call bell/phone within reach;with chair alarm set;with family/visitor present Nurse Communication: Mobility status PT Visit Diagnosis: Unsteadiness on feet (R26.81);Muscle weakness (generalized) (M62.81);History of falling (Z91.81);Difficulty in walking, not elsewhere classified (R26.2);Pain Pain - Right/Left: Right Pain - part of body: Hip    Time: 1008-1100 PT Time Calculation (min) (ACUTE ONLY): 52 min   Charges:   PT Evaluation $PT Eval Moderate Complexity: 1 Mod PT Treatments $Therapeutic Exercise: 8-22 mins $Therapeutic Activity: 8-22 mins        Ann Held PT, DPT Acute Rehab [facility] Rehabilitation P: [phone]   Jager Koska A Kimbery Harwood 12/07/2019, 11:24 AM

## 2019-12-07 NOTE — NC FL2 (Signed)
[place] LEVEL OF CARE SCREENING TOOL     IDENTIFICATION  Patient Name: Katelyn Lamb Birthdate: Feb 19, 1946 Sex: female Admission Date (Current Location): 12/06/2019  [facility] and [place] Number:  Herbalist and Address:  [facility] [place], [place] [address] [place], [place] [address]      Provider Number: [id number]  Attending Physician Name and Address:  Yaakov Guthrie, MD  Relative Name and Phone Number:       Current Level of Care: Hospital Recommended Level of Care: [place] Prior Approval Number:    Date Approved/Denied:   PASRR Number: [id number]  Discharge Plan: SNF    Current Diagnoses: Patient Active Problem List   Diagnosis Date Noted  . Pelvic fracture ([place]) 12/06/2019  . AKI (acute kidney injury) ([place]) 12/06/2019  . Severe sepsis ([place]) 07/23/2019  . UTI (urinary tract infection) 07/23/2019  . Hematuria 07/23/2019  . Acute respiratory failure with hypoxia ([place]) 07/23/2019  . Hypokalemia 07/23/2019  . Acute on chronic anemia 07/23/2019  . Fever 07/23/2019  . Leukocytosis 07/23/2019  . Sepsis ([place]) 07/23/2019  . Generalized anxiety disorder 06/17/2019  . Menopausal sweats 06/17/2019  . Memory difficulty 03/02/2019  . Degenerative spondylolisthesis 09/02/2018  . Delirium 03/26/2018  . SIRS (systemic inflammatory response syndrome) ([place]) 03/26/2018  . 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 Degeneration of lumbar intervertebral disc 11/08/2017  . Lumbar radiculopathy 11/06/2017  . Chronic neck pain 09/12/2017  . Chronic low back pain 09/06/2017  . Chronic pain syndrome 09/06/2017  . Dilated pancreatic duct 01/24/2017  . DVT, lower extremity, distal, chronic ([place]) 01/22/2017  . Elevated serum GGT level 01/22/2017  . Medication monitoring encounter 01/08/2017  . Gait abnormality 11/14/2016  . Hypotension due to drugs   . Urinary retention   . Anxiety about health   .  Reactive depression   . Ataxia   . Abdominal spasms   . Constipation due to pain medication   . Acute lower UTI   . Dysuria   . Acute deep vein thrombosis (DVT) of popliteal vein of left lower extremity ([place])   . Incomplete paraplegia ([place])   . Acute blood loss anemia   . Neurogenic bladder   . Neuropathic pain   . Muscle spasm   . Gastroesophageal reflux disease   . Slow transit constipation   . Thrombocytopenia ([place]) 10/03/2016  . Abnormal MRI, spinal cord   . Encephalomyelitis   . Numbness   . Intractable back pain 09/20/2016  . Numbness of left lower extremity 09/20/2016  . Hyponatremia 09/20/2016  . Herpes zoster without complication 0000000  . Spondylosis of cervical region without myelopathy or radiculopathy 06/16/2015    Orientation RESPIRATION BLADDER Height & Weight     Self, Time, Situation, Place  Normal Continent Weight: 159 lb 9.8 oz (72.4 kg) Height:  5\' 5"  (165.1 cm)  BEHAVIORAL SYMPTOMS/MOOD NEUROLOGICAL BOWEL NUTRITION STATUS      Continent Diet(see discharge summary)  AMBULATORY STATUS COMMUNICATION OF NEEDS Skin   Extensive Assist Verbally Other (Comment), Skin abrasions(abrasion on left hand and right leg; closed laceration on left hand w/ compression wrap; generalized ecchymosis)                       Personal Care Assistance Level of Assistance  Feeding, Bathing, Dressing Bathing Assistance: Maximum assistance Feeding assistance: Independent Dressing Assistance: Maximum assistance     Functional Limitations Info  Sight, Hearing, Speech  Sight Info: Adequate Hearing Info: Adequate Speech Info: Adequate    SPECIAL CARE FACTORS FREQUENCY  PT (By licensed PT), OT (By licensed OT)     PT Frequency: 5x week OT Frequency: 5x week            Contractures Contractures Info: Not present    Additional Factors Info  Code Status, Allergies, Psychotropic Code Status Info: DNR Allergies Info: Demerol (Meperidine), Percocet  (Oxycodone-acetaminophen), Amoxicillin-pot Clavulanate, Penicillins Psychotropic Info: diazepam (VALIUM) tablet 5 mg 2x daily PO; DULoxetine (CYMBALTA) DR capsule 30 mg daily at 10am PO; DULoxetine (CYMBALTA) DR capsule 60 mg daily at bedtime PO; QUEtiapine (SEROQUEL) tablet 25 mg daily at bedtime PO         Current Medications (12/07/2019):  This is the current hospital active medication list Current Facility-Administered Medications  Medication Dose Route Frequency Provider Last Rate Last Admin  . 0.9 %  sodium chloride infusion   Intravenous Continuous Oretha Milch D, MD 75 mL/hr at 12/06/19 2256 New Bag at 12/06/19 2256  . acetaminophen (TYLENOL) tablet 650 mg  650 mg Oral Q6H PRN Oretha Milch D, MD       Or  . acetaminophen (TYLENOL) suppository 650 mg  650 mg Rectal Q6H PRN Oretha Milch D, MD      . acyclovir (ZOVIRAX) tablet 400 mg  400 mg Oral BID Oretha Milch D, MD   400 mg at 12/07/19 0951  . diazepam (VALIUM) tablet 5 mg  5 mg Oral BID Oretha Milch D, MD   5 mg at 12/07/19 [id number]  . DULoxetine (CYMBALTA) DR capsule 30 mg  30 mg Oral q morning - 10a Oretha Milch D, MD   30 mg at 12/07/19 0951  . DULoxetine (CYMBALTA) DR capsule 60 mg  60 mg Oral QHS Oretha Milch D, MD   60 mg at 12/06/19 2109  . heparin injection 5,000 Units  5,000 Units Subcutaneous Q8H Oretha Milch D, MD   5,000 Units at 12/07/19 [phone]  . HYDROcodone-acetaminophen (NORCO) 10-325 MG per tablet 1 tablet  1 tablet Oral BID PRN Oretha Milch D, MD   1 tablet at 12/07/19 1210  . ipratropium-albuterol (DUONEB) 0.5-2.5 (3) MG/3ML nebulizer solution 3 mL  3 mL Nebulization Q4H PRN Oretha Milch D, MD      . lidocaine-EPINEPHrine (XYLOCAINE W/EPI) 2 %-1:200000 (PF) injection 10 mL  10 mL Infiltration Once Oretha Milch D, MD      . morphine 2 MG/ML injection 1 mg  1 mg Intravenous Q4H PRN Oretha Milch D, MD   1 mg at 12/07/19 0501  . ondansetron (ZOFRAN) tablet 4 mg  4 mg Oral Q6H PRN Oretha Milch D, MD        Or  . ondansetron (ZOFRAN) injection 4 mg  4 mg Intravenous Q6H PRN Oretha Milch D, MD      . pantoprazole (PROTONIX) EC tablet 40 mg  40 mg Oral QHS Oretha Milch D, MD   40 mg at 12/06/19 2110  . polyethylene glycol (MIRALAX / GLYCOLAX) packet 17 g  17 g Oral Daily Oretha Milch D, MD      . pregabalin (LYRICA) capsule 100 mg  100 mg Oral QHS Oretha Milch D, MD   100 mg at 12/06/19 2256  . QUEtiapine (SEROQUEL) tablet 25 mg  25 mg Oral QHS Oretha Milch D, MD   25 mg at 12/06/19 2109  . senna-docusate (Senokot-S) tablet 1 tablet  1 tablet Oral QHS PRN Desiree Hane, MD      .  sulfamethoxazole-trimethoprim (BACTRIM) 400-80 MG per tablet 0.5 tablet  0.5 tablet Oral Daily Oretha Milch D, MD   0.5 tablet at 12/07/19 1209     Discharge Medications: Please see discharge summary for a list of discharge medications.  Relevant Imaging Results:  Relevant Lab Results:   Additional Information SS#[id number]  [place], [place]

## 2019-12-08 LAB — BASIC METABOLIC PANEL
Anion gap: 9 (ref 5–15)
BUN: 13 mg/dL (ref 8–23)
CO2: 28 mmol/L (ref 22–32)
Calcium: 8.8 mg/dL — ABNORMAL LOW (ref 8.9–10.3)
Chloride: 103 mmol/L (ref 98–111)
Creatinine, Ser: 0.88 mg/dL (ref 0.44–1.00)
GFR calc Af Amer: >60 mL/min (ref >60)
GFR calc non Af Amer: >60 mL/min (ref >60)
Glucose, Bld: 93 mg/dL (ref 70–99)
Potassium: 3.9 mmol/L (ref 3.5–5.1)
Sodium: 140 mmol/L (ref 135–145)

## 2019-12-08 LAB — CBC
HCT: 36.3 % (ref 36.0–46.0)
Hemoglobin: 11.5 g/dL — ABNORMAL LOW (ref 12.0–15.0)
MCH: 31 pg (ref 26.0–34.0)
MCHC: 31.7 g/dL (ref 30.0–36.0)
MCV: 97.8 fL (ref 80.0–100.0)
Platelets: 166 10*3/uL (ref 150–400)
RBC: 3.71 MIL/uL — ABNORMAL LOW (ref 3.87–5.11)
RDW: 13.5 % (ref 11.5–15.5)
WBC: 6.8 10*3/uL (ref 4.0–10.5)
nRBC: 0 % (ref 0.0–0.2)

## 2019-12-08 NOTE — Progress Notes (Addendum)
PROGRESS NOTE    Katelyn Lamb  [id number] DOB: 08-May-1946 DOA: 12/06/2019 PCP: [name], MD    Brief Narrative:  Katelyn Lamb is a 74 y.o. 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  Patient apparently was at son's house for [facility] and sitting on high barstool in the kitchen when she suddenly fell from the chair. Patient landed on her rear and hit the left side of her head on the wall behind her.  Family was present during the fall. She has long history of recurrent falls that has been ongoing and presumed related to sensory ataxia as a residual side effect from viral meningoencephalitis (diagnosed 2017). She uses a walker at baseline. She has chronic pain patient takes opioid as needed for that as well as Lyrica, no changes in medications recently. Regarding viral encephalomyelitis, patient is on chronic acyclovir.  T-max 98.8, respiratory rate range 17-23, document SPO2 of 87% on room air, on 2 L during examination, hemodynamically stable. In ED WBC 11.7, creatinine 1.18.  Covid test negative. CT head negative for acute intracranial abnormalities, CT cervical spine showed normal alignment of cervical vertebral bodies. X-ray of hip/pelvis concerning for left pubic bone/pubic ramus fractures superimposed on old left inferior pubic ramus fractures. X-ray of left hand no fractures or dislocations.   Assessment & Plan:   Active Problems:   Encephalomyelitis   Neuropathic pain   Neurogenic bladder   Ataxia   Gait abnormality   Chronic pain syndrome   Pelvic fracture (HCC)   AKI (acute kidney injury) ([place])  Recurrent falls related to sensory ataxia due to history of viral manic encephalitis/acute fracture left  superior and inferior pubic rami and old fracture left pubic bone after fall.  CT head and cervical spine unremarkable. Continue home opioid regimen, morphine IV as needed severe pain EDP discussed with Dr. Alvan Dame ([facility]) --weight bearing as tolerated and outpt follow in 4 weeks-PT/OT recommended CIR.  CIR consulted.   Hypoxia/atelectasis: SPO2 documented as 87% on room air in ED, on 2 L with SPO2 greater than 92%. Patient without any cough, normal respiratory effort, no respiratory symptoms.  She is now saturating over 90% on room air.  Continue incentive spirometry.  AKI, mild suspect prerenal etiology.  Baseline creatinine 0.6-0.8.  1.18 on admission. Likely prerenal.  Resolved.  T  Leukocytosis: Reactive.  Resolved.  Left hand laceration X-ray showed no fracture dislocation.  No signs of infection on examination -Suturing by EDP  Chronic urinary retention with concern for neurogenic bladder in the past.  Has history of recurrent UTIs.  Denies any dysuria, malodorous urine or changes in color in urine -Continue home low-dose Bactrim for UTI prophylaxis  History of HSV and encephalomyelitis(dx'd 2017),stable No new confusion -Continue chronic acyclovir  Chronic pain/peripheral neuropathy -home Lyrica -Home norco BID PRN  Depression/anxiety, stable -Continue home diazepam/Cymbalta  Insomnia -Continue nightly home Seroquel  Memory difficulty Currently alert and oriented.  CT head with no acute disease, does show age-related cerebral atrophy, ventriculomegaly -Delirium precautions  GERD, stable --Home PPI  DVT Prophylaxis Heparin  Family Communication:  No family present at bedside. Code Status DNR (do not want CPR or intubation for cardiac arrest), okay with intubation if breathing becomes worse Patient from: Home Disposition plan: CIR likely in 1 to  2 days Barriers to discharge: Insurance authorization pending   Antimicrobials:   Bactrim  suppression    Subjective: Seen and examined.  She was alert and oriented.  She complained of some left hand pain and bilateral groin pain.  She was asking if there was any fracture seen on x-ray of the left hand.  I updated her with results.  Objective: Vitals:   12/07/19 1330 12/07/19 2020 12/08/19 0330 12/08/19 0733  BP: 106/67 109/69 104/66 113/76  Pulse: 83 79 81 81  Resp: 18 16 16 14   Temp: 98.5 F (36.9 C) 98.1 F (36.7 C) 97.7 F (36.5 C) 98.3 F (36.8 C)  TempSrc: Oral Oral Oral Oral  SpO2: 91% 95% 93% 92%  Weight:      Height:        Intake/Output Summary (Last 24 hours) at 12/08/2019 0916 Last data filed at 12/08/2019 0914 Gross per 24 hour  Intake --  Output 1000 ml  Net -1000 ml   Filed Weights   12/06/19 2100  Weight: 72.4 kg    Examination:  General exam: Appears calm and comfortable  Respiratory system: Clear to auscultation. Respiratory effort normal. Cardiovascular system: S1 & S2 heard, RRR. No JVD, murmurs, rubs, gallops or clicks. No pedal edema. Gastrointestinal system: Abdomen is nondistended, soft and nontender. No organomegaly or masses felt. Normal bowel sounds heard. Central nervous system: Alert and oriented. No focal neurological deficits. Extremities: Symmetric 5 x 5 power.  Dressing in the left hand. Skin: No rashes, lesions or ulcers.  Psychiatry: Judgement and insight appear normal. Mood & affect appropriate.   Data Reviewed: I have personally reviewed following labs and imaging studies  CBC: Recent Labs  Lab 12/06/19 1605 12/07/19 0355 12/08/19 0524  WBC 11.7* 7.8 6.8  HGB 12.1 11.2* 11.5*  HCT 39.3 35.0* 36.3  MCV 98.7 97.5 97.8  PLT 202 182 [id number]   Basic Metabolic Panel: Recent Labs  Lab 12/06/19 1605 12/07/19 0355 12/08/19 0524  NA 140 140 140  K 3.8 3.9 3.9  CL 101 101 103  CO2 29 27 28   GLUCOSE 106* 100* 93  BUN 11 12 13   CREATININE 1.18* 1.06* 0.88  CALCIUM 9.0 8.4* 8.8*   GFR: Estimated Creatinine  Clearance: 57.7 mL/min (by C-G formula based on SCr of 0.88 mg/dL). Liver Function Tests: Recent Labs  Lab 12/06/19 1605  AST 35  ALT 23  ALKPHOS 121  BILITOT 0.4  PROT 6.6  ALBUMIN 3.4*   No results for input(s): LIPASE, AMYLASE in the last 168 hours. No results for input(s): AMMONIA in the last 168 hours. Coagulation Profile: No results for input(s): INR, PROTIME in the last 168 hours. Cardiac Enzymes: No results for input(s): CKTOTAL, CKMB, CKMBINDEX, TROPONINI in the last 168 hours. BNP (last 3 results) No results for input(s): PROBNP in the last 8760 hours. HbA1C: No results for input(s): HGBA1C in the last 72 hours. CBG: No results for input(s): GLUCAP in the last 168 hours. Lipid Profile: No results for input(s): CHOL, HDL, LDLCALC, TRIG, CHOLHDL, LDLDIRECT in the last 72 hours. Thyroid Function Tests: No results for input(s): TSH, T4TOTAL, FREET4, T3FREE, THYROIDAB in the last 72 hours. Anemia Panel: No results for input(s): VITAMINB12, FOLATE, FERRITIN, TIBC, IRON, RETICCTPCT in the last 72 hours. Sepsis Labs: No results for input(s): PROCALCITON, LATICACIDVEN in the last 168 hours.  Recent Results (from the past 240 hour(s))  SARS CORONAVIRUS 2 (TAT 6-24 HRS) Nasopharyngeal Nasopharyngeal Swab     Status: None  Collection Time: 12/06/19  3:59 PM   Specimen: Nasopharyngeal Swab  Result Value Ref Range Status   SARS Coronavirus 2 NEGATIVE NEGATIVE Final    Comment: (NOTE) SARS-CoV-2 target nucleic acids are NOT DETECTED. The SARS-CoV-2 RNA is generally detectable in upper and lower respiratory specimens during the acute phase of infection. Negative results do not preclude SARS-CoV-2 infection, do not rule out co-infections with other pathogens, and should not be used as the sole basis for treatment or other patient management decisions. Negative results must be combined with clinical observations, patient history, and epidemiological information. The  expected result is Negative. Fact Sheet for Patients: SugarRoll.be Fact Sheet for Healthcare Providers: https://www.woods-mathews.com/ This test is not yet approved or cleared by the [place] FDA and  has been authorized for detection and/or diagnosis of SARS-CoV-2 by FDA under an Emergency Use Authorization (EUA). This EUA will remain  in effect (meaning this test can be used) for the duration of the COVID-19 declaration under Section 56 4(b)(1) of the Act, 21 U.S.C. section 360bbb-3(b)(1), unless the authorization is terminated or revoked sooner. Performed at [facility], [place] [address], [place], [place] [address]          Radiology Studies: CT Head Wo Contrast  Result Date: 12/06/2019 CLINICAL DATA:  [place]. Hit head. EXAM: CT HEAD WITHOUT CONTRAST CT CERVICAL SPINE WITHOUT CONTRAST TECHNIQUE: Multidetector CT imaging of the head and cervical spine was performed following the standard protocol without intravenous contrast. Multiplanar CT image reconstructions of the cervical spine were also generated. COMPARISON:  None. FINDINGS: CT HEAD FINDINGS Brain: Age related cerebral atrophy, ventriculomegaly and fairly extensive periventricular white matter disease. No extra-axial fluid collections are identified. No CT findings for acute hemispheric infarction or intracranial hemorrhage. No mass lesions. The brainstem and cerebellum are normal. Vascular: Moderate vascular calcifications. No aneurysm or hyperdense vessels. Skull: No skull fracture or bone lesions. Sinuses/Orbits: The paranasal sinuses and mastoid air cells are clear. Small amount of fluid in the right mastoid air cells. The globes are intact. Other: No scalp lesions or hematoma. CT CERVICAL SPINE FINDINGS Alignment: Mild reversal of the normal cervical lordosis. The cervical vertebral bodies are normally aligned. Skull base and vertebrae: Advanced degenerative changes noted at C1-2  but no dens or other vertebral body fractures. The facets are normally aligned. No facet or laminar fractures. The skull base C1 and C1-2 articulations are maintained. Soft tissues and spinal canal: No prevertebral fluid or swelling. No visible canal hematoma. Disc levels: The spinal canal is fairly generous. No significant spinal or foraminal stenosis. Upper chest: The upper ribs are intact and the visualized lung apices are grossly clear. Other: No neck mass or adenopathy. IMPRESSION: 1. Age related cerebral atrophy, ventriculomegaly and fairly extensive periventricular white matter disease. 2. No acute intracranial findings or skull fracture. 3. Normal alignment of the cervical vertebral bodies and no acute cervical spine fracture. 4. Moderate degenerative cervical spondylosis with multilevel disc disease and facet disease. Electronically Signed   By: Marijo Sanes M.D.   On: 12/06/2019 15:34   CT Cervical Spine Wo Contrast  Result Date: 12/06/2019 CLINICAL DATA:  [place]. Hit head. EXAM: CT HEAD WITHOUT CONTRAST CT CERVICAL SPINE WITHOUT CONTRAST TECHNIQUE: Multidetector CT imaging of the head and cervical spine was performed following the standard protocol without intravenous contrast. Multiplanar CT image reconstructions of the cervical spine were also generated. COMPARISON:  None. FINDINGS: CT HEAD FINDINGS Brain: Age related cerebral atrophy, ventriculomegaly and fairly extensive periventricular white matter  disease. No extra-axial fluid collections are identified. No CT findings for acute hemispheric infarction or intracranial hemorrhage. No mass lesions. The brainstem and cerebellum are normal. Vascular: Moderate vascular calcifications. No aneurysm or hyperdense vessels. Skull: No skull fracture or bone lesions. Sinuses/Orbits: The paranasal sinuses and mastoid air cells are clear. Small amount of fluid in the right mastoid air cells. The globes are intact. Other: No scalp lesions or hematoma. CT CERVICAL  SPINE FINDINGS Alignment: Mild reversal of the normal cervical lordosis. The cervical vertebral bodies are normally aligned. Skull base and vertebrae: Advanced degenerative changes noted at C1-2 but no dens or other vertebral body fractures. The facets are normally aligned. No facet or laminar fractures. The skull base C1 and C1-2 articulations are maintained. Soft tissues and spinal canal: No prevertebral fluid or swelling. No visible canal hematoma. Disc levels: The spinal canal is fairly generous. No significant spinal or foraminal stenosis. Upper chest: The upper ribs are intact and the visualized lung apices are grossly clear. Other: No neck mass or adenopathy. IMPRESSION: 1. Age related cerebral atrophy, ventriculomegaly and fairly extensive periventricular white matter disease. 2. No acute intracranial findings or skull fracture. 3. Normal alignment of the cervical vertebral bodies and no acute cervical spine fracture. 4. Moderate degenerative cervical spondylosis with multilevel disc disease and facet disease. Electronically Signed   By: Marijo Sanes M.D.   On: 12/06/2019 15:34   CT PELVIS WO CONTRAST  Result Date: 12/06/2019 CLINICAL DATA:  Possible pelvic fractures on plain films EXAM: CT PELVIS WITHOUT CONTRAST TECHNIQUE: Multidetector CT imaging of the pelvis was performed following the standard protocol without intravenous contrast. COMPARISON:  Plain films earlier today FINDINGS: Urinary Tract:  No abnormality visualized. Bowel:  Unremarkable visualized pelvic bowel loops. Vascular/Lymphatic: Aortoiliac atherosclerosis.  No adenopathy. Reproductive:  Prior hysterectomy.  No adnexal masses. Other:  No free fluid or free air. Musculoskeletal: Old healed left inferior pubic ramus fracture noted. There is also acute fractures involving the left superior pubic ramus and left pubic bone best seen on coronal imaging, and left inferior pubic ramus best seen on axial images. No proximal femoral abnormality.  No subluxation or dislocation. Prior vertebral augmentation at L5. IMPRESSION: Acute fractures through the left superior and inferior pubic rami as well as left pubic bone. Old healed left inferior pubic ramus fracture. Electronically Signed   By: Rolm Baptise M.D.   On: 12/06/2019 22:10   DG CHEST PORT 1 VIEW  Result Date: 12/06/2019 CLINICAL DATA:  Hypoxia. EXAM: PORTABLE CHEST 1 VIEW COMPARISON:  Radiograph and CT 07/23/2019 FINDINGS: The cardiomediastinal contours are normal. Minor bibasilar atelectasis. Pulmonary vasculature is normal. No consolidation, pleural effusion, or pneumothorax. No acute osseous abnormalities are seen. IMPRESSION: Minor bibasilar atelectasis. Electronically Signed   By: Keith Rake M.D.   On: 12/06/2019 18:45   DG Hand Complete Left  Result Date: 12/06/2019 CLINICAL DATA:  Fall, pain. EXAM: LEFT HAND - COMPLETE 3+ VIEW COMPARISON:  None. FINDINGS: Osseous alignment is normal. No fracture line or displaced fracture fragment is seen. Degenerative osteoarthritis at the first [facility] joint, moderate in degree with osteophyte formation. Additional degenerative changes at the second through fifth PIP joints, mild to moderate in degree. IMPRESSION: 1. No acute findings. No osseous fracture or dislocation seen. 2. Degenerative osteoarthritis, most prominent at the first [facility] joint. Electronically Signed   By: Franki Cabot M.D.   On: 12/06/2019 15:24   DG Hip Unilat W or Wo Pelvis 2-3 Views Left  Result Date: 12/06/2019  CLINICAL DATA:  Fall, pain EXAM: DG HIP (WITH OR WITHOUT PELVIS) 2-3V LEFT COMPARISON:  04/26/2017 FINDINGS: Old left inferior pubic ramus fracture noted. There is lucency throughout the superior pubic ramus and pubic bone on the left which could reflect superimposed acute fracture. No proximal femoral abnormality. Hip joints and SI joints are symmetric. IMPRESSION: Old left inferior pubic ramus fracture. Concern for superimposed left pubic bone and superior pubic  ramus fractures. Electronically Signed   By: Rolm Baptise M.D.   On: 12/06/2019 15:22        Scheduled Meds: . acyclovir  400 mg Oral BID  . diazepam  5 mg Oral BID  . DULoxetine  30 mg Oral q morning - 10a  . DULoxetine  60 mg Oral QHS  . heparin  5,000 Units Subcutaneous Q8H  . lidocaine-EPINEPHrine  10 mL Infiltration Once  . pantoprazole  40 mg Oral QHS  . polyethylene glycol  17 g Oral Daily  . pregabalin  100 mg Oral QHS  . QUEtiapine  25 mg Oral QHS  . sulfamethoxazole-trimethoprim  0.5 tablet Oral Daily   Continuous Infusions:    LOS: 1 day   Total time spent 29 minutes  Darliss Cheney , MD [facility]   To contact the attending provider between 7A-7P or the covering provider during after hours 7P-7A, please log into the web site [email or web address] and access using universal [place] password for that web site. If you do not have the password, please call the hospital operator.  12/08/2019, 9:16 AM

## 2019-12-08 NOTE — Plan of Care (Signed)
?  Problem: Clinical Measurements: ?Goal: Will remain free from infection ?Outcome: Progressing ?  ?

## 2019-12-08 NOTE — Progress Notes (Signed)
Inpatient Rehab Admissions Coordinator:   Note pt transitioned to inpatient status.  At this time, we are recommending a CIR consult.  I will place an order per our protocol.   Shann Medal, PT, DPT Admissions Coordinator [phone] 12/08/19  11:52 AM

## 2019-12-08 NOTE — Progress Notes (Signed)
Occupational Therapy Evaluation (late entry)  Pt was admitted with the below listed diagnosis and presents with the below listed deficits. She currently requires mod A +2 for functional transfers, but did progress to min A +2 with use of sara stedy.  She requires min A - total A for ADLs.  She lives with her spouse, who is very supportive and her primary caregiver.  She requires min A, overall at baseline, but status does fluctuate, and she does experience frequent unexplained falls.   Feel she would benefit from CIR level therapies due to her h/o of chronic neurologic deficits that require a rehab team who specialize in neuro rehab.  She will also benefit from the consistency and intensity offered by CIR to allow her to maximize safety with ADLs in order to return home with spouse, reduce risk/frequency of falls, and subsequent readmissions.  Will follow acutely.     12/07/19 1400  OT Visit Information  Last OT Received On 12/07/19  Assistance Needed +1  History of Present Illness Katelyn Lamb is a 74 y.o. 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 Imaging: CT head negative for acute intracranial abnormalities, CT cervical spine showed normal alignment of cervical vertebral bodies. X-ray of left hand no fractures or dislocations.  Precautions  Precautions None  Restrictions  Weight Bearing Restrictions Yes  Other Position/Activity Restrictions WBAT  Home Living  Family/patient expects to be discharged to: Private residence  Living Arrangements Spouse/significant other  Available Help at Discharge Available 24 hours/day;Family  Type of [place] to enter   Entrance Stairs-Number of Steps 2  Entrance Stairs-Rails None  Home Layout Two level;Able to live on main level with bedroom/bathroom  Bathroom Shower/Tub Tub/shower unit;Curtain  Corporate treasurer No  Home Herbalist - 2 wheels;BSC;Wheelchair - manual;Tub bench  Additional Comments spouse is very supportive  Prior Function  Level of Independence Needs assistance  Gait / Transfers Assistance Needed occasional use of RW as needed and support from spouse  ADL's / [place] pt states her husband has to still help her with peri care/ husband manages IADLs  Comments Pt and spouse report that she has impaired proprioceptive awareness so if she can't see her hands, she is unable to perofm a task, such as peri care, fastening bra, washing back, etc   Communication  Communication No difficulties;HOH  Pain Assessment  Pain Assessment Faces  Faces Pain Scale 4  Pain Location Lt hip  Pain Descriptors / Indicators Aching;Dull;Guarding;Grimacing  Pain Intervention(s) Monitored during session;Repositioned  Cognition  Arousal/Alertness Lethargic;Awake/alert  Behavior During Therapy Flat affect  Overall Cognitive Status Impaired/Different from baseline  Area of Impairment Orientation;Attention;Memory;Following commands;Problem solving;Awareness  Orientation Level Disoriented to;Time  Current Attention Level Sustained  Memory Decreased short-term memory  Following Commands Follows one step commands consistently;Follows multi-step commands inconsistently  Awareness Emergent  Problem Solving Slow processing;Difficulty sequencing;Decreased initiation;Requires verbal cues;Requires tactile cues  General Comments Pt with baseline cognitive deficits that spouse report are variable   Upper Extremity Assessment  Upper Extremity Assessment Generalized weakness;RUE deficits/detail;LUE deficits/detail  RUE Deficits / Details impaired proprioception per  spouse and pt report   RUE Sensation decreased proprioception  RUE Coordination decreased gross motor;decreased fine  motor  LUE Deficits / Details pt with bandage over Lt hand due to laceration and sutures.  Spouse and pt report impaired proprioception   LUE Sensation decreased proprioception  LUE Coordination decreased fine motor;decreased gross motor  Lower Extremity Assessment  Lower Extremity Assessment Defer to PT evaluation  Cervical / Trunk Assessment  Cervical / Trunk Assessment Normal  ADL  Overall ADL's  Needs assistance/impaired  Eating/Feeding Set up;Sitting  Grooming Wash/dry hands;Wash/dry face;Oral care;Minimal assistance;Sitting  Upper Body Bathing Minimal assistance;Sitting  Lower Body Bathing Maximal assistance;Sit to/from stand  Upper Body Dressing  Maximal assistance;Sitting  Lower Body Dressing Total assistance;Sit to/from Retail buyer Moderate assistance;+2 for safety/equipment;Stand-pivot;Comfort height toilet;BSC (use of stedy)  Toileting- Clothing Manipulation and Hygiene Total assistance;Bed level  Functional mobility during ADLs Moderate assistance;+2 for safety/equipment (stedy)  General ADL Comments pt requires mod verbal cues for sequencing and problem solving.  she demonstrates difficulty with sit to stand and with advancing LEs to take steps  Vision- History  Patient Visual Report No change from baseline  Perception  Perception Tested? Yes  Praxis  Praxis tested? Deficits  Deficits Initiation;Organization  Bed Mobility  Overal bed mobility Needs Assistance  Bed Mobility Sit to Supine  Supine to sit Max assist;HOB elevated  General bed mobility comments Pt requires assist to control descent of trunk and to lift LEs   Transfers  Overall transfer level Needs assistance  Equipment used 1 person hand held assist;Ambulation equipment used  Transfers Sit to/from [facility]  Sit to Stand Mod assist  Stand pivot transfers Mod  assist;+2 safety/equipment  General transfer comment Pt requires increased time and facilitation for forward translation of trunk moving sit to stand from recliner (low surface).  She requires assist to boost hips and achieve full hip extension.  She demonstrates signficant difficulty with advancing feet to take steps.  once pt transferred to the bed, she requested use of commode.  Clarise Cruz stedy was used to transfer her into BR.  she initially required mod A to move sit to stand, but progressed to min A with use of stedy.  she require repeated cues for hand placement, problem solving and sequencing   Balance  Overall balance assessment Needs assistance  Sitting-balance support Bilateral upper extremity supported  Sitting balance-Leahy Scale Fair  Standing balance support Bilateral upper extremity supported  Standing balance-Leahy Scale Poor  Standing balance comment Requires BUE support and Mod A   General Comments  General comments (skin integrity, edema, etc.) spouse present and very supportive.  He is her primary caregiver, and reports pt has frequent falls - sounds like due to inattention, and poor proprioceptive awareness?  He has sustained a back injury due to assisting her, but is very committed to continuing to provide care.    OT - End of Session  Equipment Utilized During Treatment Gait belt;Other (comment) (stedy)  Activity Tolerance Patient tolerated treatment well  Patient left in bed;with call bell/phone within reach;with bed alarm set;with nursing/sitter in room;with family/visitor present  Nurse Communication Mobility status;Need for lift equipment  OT Assessment  OT Recommendation/Assessment Patient needs continued OT Services  OT Visit Diagnosis Unsteadiness on feet (R26.81);Cognitive communication deficit (R41.841);Muscle weakness (generalized) (M62.81)  OT Problem List Decreased strength;Decreased activity tolerance;Impaired balance (sitting and/or standing);Decreased  coordination;Decreased cognition;Decreased safety awareness;Decreased knowledge of use of DME or AE;Impaired UE functional use;Pain  Barriers to Discharge Decreased caregiver support  Barriers to Discharge Comments spouse unable to provide current level of physical assistance (mod A)  OT Plan  OT Frequency (ACUTE ONLY) Min 2X/week  OT Treatment/Interventions (ACUTE ONLY) Self-care/ADL training;Neuromuscular education;DME and/or AE instruction;Therapeutic activities;Cognitive remediation/compensation;Patient/family education;Balance training  AM-PAC OT "6 Clicks" Daily Activity Outcome Measure (Version 2)  Help from another person eating meals? 3  Help from another person taking care of personal grooming? 3  Help from another person toileting, which includes using toliet, bedpan, or urinal? 2  Help from another person bathing (including washing, rinsing, drying)? 2  Help from another person to put on and taking off regular upper body clothing? 2  Help from another person to put on and taking off regular lower body clothing? 1  6 Click Score 13  OT Recommendation  Recommendations for Other Services Rehab consult  Follow Up Recommendations CIR;Supervision/Assistance - 24 hour  OT Equipment None recommended by OT  Individuals Consulted  Consulted and Agree with Results and Recommendations Patient;Family member/caregiver  Family Member Consulted spouse  Acute Rehab OT Goals  Patient Stated Goal to get stronger and return to prior level of functioning   OT Goal Formulation With patient/family  Time For Goal Achievement 12/15/19  Potential to Achieve Goals Good  OT Time Calculation  OT Start Time (ACUTE ONLY) 1204  OT Stop Time (ACUTE ONLY) 1303  OT Time Calculation (min) 59 min  OT General Charges  $OT Visit 1 Visit  OT Evaluation  $OT Eval Moderate Complexity 1 Mod  OT Treatments  $Self Care/Home Management  38-52 mins  Written Expression  Dominant Hand Right  Nilsa Nutting., OTR/L Acute  Rehabilitation Services Pager [phone] Office [phone]

## 2019-12-08 NOTE — Progress Notes (Signed)
Physical Therapy Treatment Patient Details Name: Katelyn Lamb MRN: [id number] DOB: [birth year] Today's Date: 12/08/2019    History of Present Illness Katelyn Lamb is a 74 y.o. 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 Imaging: CT head negative for acute intracranial abnormalities, CT cervical spine showed normal alignment of cervical vertebral bodies. X-ray of left hand no fractures or dislocations.    PT Comments    Pt demonstrates steady progress towards PT goals. Tx focused on exercise progression and functional transfer progression. Pt demonstrated much improved functional transfers with improved ability to weight shift in standing and take steps during stand pivot transfer as well as reduced assistance required for bed mobility (max to modA only for scoot). Initially performed sara steady transfer to toilet to ensure limited WB on LUE since patient's husband reported pt needs minimal use to not tear stitches on back of L hand. Pt was much more lively and joking with therapist today with improved demeanor.  She stated she does wish to talk to her pain management doctor because the current pain meds do not fully take away the pain. Explained how it was normal to have the pain after her type of injury and how it appears well controlled due to her being able to progress so well with stepping and weight shifting today. Case management notified patient and husband were inquiring to talk to them about her current care plan. Pt will continue to benefit from skilled PT to progress functional transfers to address deficits listed below until transition to next level of care.     Follow Up  Recommendations  CIR     Equipment Recommendations  Other (comment)    Recommendations for Other Services Rehab consult     Precautions / Restrictions Precautions Precautions: None Restrictions Weight Bearing Restrictions: Yes Other Position/Activity Restrictions: WBAT    Mobility  Bed Mobility Overal bed mobility: Needs Assistance Bed Mobility: Supine to Sit     Supine to sit: Mod assist;HOB elevated     General bed mobility comments: Required assistance only with scooting to EOB today, improved trunk control today.  Transfers Overall transfer level: Needs assistance Equipment used: Rolling walker (2 wheeled) Transfers: Sit to/from [facility] Sit to Stand: Min assist Stand pivot transfers: Min assist;+2 safety/equipment;+2 physical assistance       General transfer comment: Nurse tech was present and assisted with transfer. Pt required intermittent VCs for weight shifts toward L side, leaning heavily to right today. Pt is not as limited by pain today, being able to lift feet and clear floor. MinA required for LE management for improved step length to get to chair safely.      Balance Overall balance assessment: Needs assistance Sitting-balance support: Bilateral upper extremity supported Sitting balance-Leahy Scale: Fair     Standing balance support: Bilateral upper extremity supported Standing balance-Leahy Scale: Poor Standing balance comment: Requires BUE support and Mod A         Cognition Arousal/Alertness: Awake/alert Behavior During Therapy: WFL for tasks assessed/performed Overall Cognitive Status: Impaired/Different from baseline Area of Impairment: Orientation;Attention;Memory;Following commands;Problem solving;Awareness   Orientation Level: Disoriented to;Time Current Attention Level: Sustained Memory: Decreased short-term memory Following Commands: Follows one step commands consistently;Follows multi-step commands  inconsistently  Awareness: Emergent Problem Solving: Slow processing;Difficulty sequencing;Decreased initiation;Requires verbal cues;Requires tactile cues General Comments: Spouse reports intermittent levels of cognitive deficits stating today seems like a good day.       Exercises General Exercises - Lower Extremity Ankle Circles/Pumps: AROM;Both;15 reps Long Arc Quad: AROM;Both;15 reps Hip Flexion/Marching: Strengthening;20 reps;Seated Toe Raises: AROM;Both;10 reps;Seated Heel Raises: AROM;Both;10 reps;Seated    General Comments General comments (skin integrity, edema, etc.): VSS throughout, HR baseline 85 up to 104 with activity and normal decline following mobility.      Pertinent Vitals/Pain Pain Assessment: Faces Faces Pain Scale: Hurts a little bit Pain Location: Lt hip Pain Descriptors / Indicators: Aching;Dull;Guarding;Grimacing Pain Intervention(s): Monitored during session;Repositioned;Limited activity within patient's tolerance           PT Goals (current goals can now be found in the care plan section) Acute Rehab PT Goals Patient Stated Goal: to get stronger and return to prior level of functioning  PT Goal Formulation: With patient Time For Goal Achievement: 12/21/19 Potential to Achieve Goals: Fair Progress towards PT goals: Progressing toward goals    Frequency    Min 5X/week      PT Plan Frequency needs to be updated       AM-PAC PT "6 Clicks" Mobility   Outcome Measure  Help needed turning from your back to your side while in a flat bed without using bedrails?: A Little Help needed moving from lying on your back to sitting on the side of a flat bed without using bedrails?: A Little Help needed moving to and from a bed to a chair (including a wheelchair)?: A Little Help needed standing up from a chair using your arms (e.g., wheelchair or bedside chair)?: A Little Help needed to walk in hospital room?: A Lot Help needed climbing 3-5 steps  with a railing? : Total 6 Click Score: 15    End of Session Equipment Utilized During Treatment: Gait belt Activity Tolerance: Patient tolerated treatment well Patient left: in chair;with call bell/phone within reach;with chair alarm set;with family/visitor present;Other (comment)(NT in room getting purwick set up) Nurse Communication: Mobility status PT Visit Diagnosis: Unsteadiness on feet (R26.81);Muscle weakness (generalized) (M62.81);History of falling (Z91.81);Difficulty in walking, not elsewhere classified (R26.2);Pain Pain - Right/Left: Right Pain - part of body: Hip     Time: [id number] PT Time Calculation (min) (ACUTE ONLY): 50 min  Charges:  $Therapeutic Exercise: 8-22 mins $Therapeutic Activity: 23-37 mins                     Ann Held PT, DPT Acute Rehab [facility] Rehabilitation P: [phone]    Murline Weigel A Irfan Veal 12/08/2019, 4:20 PM

## 2019-12-09 DIAGNOSIS — R0902 Hypoxemia: Secondary | ICD-10-CM | POA: Diagnosis present

## 2019-12-09 DIAGNOSIS — Z66 Do not resuscitate: Secondary | ICD-10-CM

## 2019-12-09 DIAGNOSIS — S61412A Laceration without foreign body of left hand, initial encounter: Secondary | ICD-10-CM | POA: Diagnosis present

## 2019-12-09 MED ORDER — METHOCARBAMOL 500 MG PO TABS
500.0000 mg | ORAL_TABLET | Freq: Three times a day (TID) | ORAL | Status: AC
  Administered 2019-12-09 – 2019-12-10 (×6): 500 mg via ORAL
  Filled 2019-12-09 (×6): qty 1

## 2019-12-09 MED ORDER — OXYCODONE HCL 5 MG PO TABS
2.5000 mg | ORAL_TABLET | Freq: Four times a day (QID) | ORAL | Status: DC | PRN
  Administered 2019-12-10: 13:00:00 2.5 mg via ORAL
  Filled 2019-12-09: qty 1

## 2019-12-09 MED ORDER — ACETAMINOPHEN 500 MG PO TABS
1000.0000 mg | ORAL_TABLET | Freq: Three times a day (TID) | ORAL | Status: AC
  Administered 2019-12-09 – 2019-12-15 (×21): 1000 mg via ORAL
  Filled 2019-12-09 (×21): qty 2

## 2019-12-09 MED ORDER — OXYCODONE HCL 5 MG PO TABS
5.0000 mg | ORAL_TABLET | Freq: Four times a day (QID) | ORAL | Status: DC | PRN
  Administered 2019-12-09: 5 mg via ORAL
  Filled 2019-12-09: qty 1

## 2019-12-09 NOTE — Progress Notes (Signed)
Dr. Eliseo Squires notified about pt's blood pressure of 99/66. No new orders at this time. Pt asymptomatic.

## 2019-12-09 NOTE — Plan of Care (Signed)
  Problem: Pain Managment: Goal: General experience of comfort will improve Outcome: Progressing   

## 2019-12-09 NOTE — Progress Notes (Signed)
Physical Therapy Treatment Patient Details Name: Katelyn Lamb MRN: [id number] DOB: 10/29/1945 Today's Date: 12/09/2019    History of Present Illness Katelyn Lamb is a 74 y.o. 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 Imaging: CT head negative for acute intracranial abnormalities, CT cervical spine showed normal alignment of cervical vertebral bodies. X-ray of left hand no fractures or dislocations.    PT Comments    Pt demonstrated improved progress towards therapy goals being able to progress forward gait today. She continues to have difficulty with B LE foot clearance secondary to pain. Pt was able to demonstrate improved gait speed with music assistance. She required intermittent VCs for upright posture and RLE forward progression.  Pt demonstrate improved forward progression with increased upright posture. Pt toileted with sara steady because she wanted to attempt BM on regular toilet. Pt able to stand upright through full duration of transfer demonstrating improved postural tolerance from prior sessions. Pt demonstrates good progress that can be improved with CIR; PT will continue following acutely until transition to next level of care.  Follow Up Recommendations  CIR     Equipment Recommendations  Other (comment)    Recommendations for Other Services Rehab consult     Precautions / Restrictions Precautions Precautions: None Restrictions Weight Bearing Restrictions: Yes Other Position/Activity Restrictions: WBAT    Mobility  Bed Mobility Overal bed mobility: Needs Assistance Bed Mobility: Supine to Sit     Supine to sit: Mod assist;HOB elevated     General  bed mobility comments: Required assistance with trunk mobility today  Transfers Overall transfer level: Needs assistance Equipment used: Rolling walker (2 wheeled) Transfers: Sit to/from Stand Sit to Stand: Mod assist;Min guard         General transfer comment: Pt demonstrated CGA-modA required depending on fatigue and level of surface. She required no assistance from bed, but required modA after first sitting on toilet to retrieve purwick.   Ambulation/Gait Ambulation/Gait assistance: Min assist;+2 safety/equipment Gait Distance (Feet): 6 Feet Assistive device: Rolling walker (2 wheeled) Gait Pattern/deviations: Step-to pattern;Shuffle Gait velocity: decreased   General Gait Details: Pt required intermittent assistance with RLE forward progression. Improved with music (beach boys) with increased lateral weight shifts and small shuffling steps, but fatigued quickly in UE with increased pace.       Balance Overall balance assessment: Needs assistance Sitting-balance support: Bilateral upper extremity supported Sitting balance-Leahy Scale: Fair     Standing balance support: Bilateral upper extremity supported Standing balance-Leahy Scale: Poor Standing balance comment: Requires BUE support          Cognition Arousal/Alertness: Awake/alert Behavior During Therapy: WFL for tasks assessed/performed Overall Cognitive Status: Impaired/Different from baseline Area of Impairment: Orientation;Attention;Memory;Following commands;Problem solving;Awareness    Orientation Level: Disoriented to;Time Current Attention Level: Sustained Memory: Decreased short-term memory Following Commands: Follows one step commands consistently;Follows multi-step commands inconsistently   Awareness: Emergent Problem Solving: Slow processing;Difficulty sequencing;Decreased initiation;Requires verbal cues;Requires tactile cues General Comments: Spouse reports intermittent levels of cognitive deficits  stating today seems like a good day.       Exercises General Exercises - Lower Extremity Ankle Circles/Pumps: AROM;Both;15 reps Long Arc Quad: AROM;Both;10 reps;Seated Hip Flexion/Marching: Strengthening;Seated;10 reps        Pertinent Vitals/Pain Pain Assessment:  Faces Faces Pain Scale: Hurts little more Pain Location: Lt hip Pain Descriptors / Indicators: Aching;Dull;Guarding;Grimacing Pain Intervention(s): Limited activity within patient's tolerance;Monitored during session           PT Goals (current goals can now be found in the care plan section) Acute Rehab PT Goals Patient Stated Goal: Go to the bathroom PT Goal Formulation: With patient Time For Goal Achievement: 12/21/19 Potential to Achieve Goals: Fair Progress towards PT goals: Progressing toward goals    Frequency    Min 5X/week      PT Plan Current plan remains appropriate       AM-PAC PT "6 Clicks" Mobility   Outcome Measure  Help needed turning from your back to your side while in a flat bed without using bedrails?: A Little Help needed moving from lying on your back to sitting on the side of a flat bed without using bedrails?: A Little Help needed moving to and from a bed to a chair (including a wheelchair)?: A Little Help needed standing up from a chair using your arms (e.g., wheelchair or bedside chair)?: A Little Help needed to walk in hospital room?: A Lot Help needed climbing 3-5 steps with a railing? : Total 6 Click Score: 15    End of Session Equipment Utilized During Treatment: Gait belt Activity Tolerance: Patient tolerated treatment well Patient left: in chair;with call bell/phone within reach;with chair alarm set;with family/visitor present;Other (comment) Nurse Communication: Mobility status PT Visit Diagnosis: Unsteadiness on feet (R26.81);Muscle weakness (generalized) (M62.81);History of falling (Z91.81);Difficulty in walking, not elsewhere classified (R26.2);Pain Pain -  Right/Left: Right Pain - part of body: Hip     Time: [id number] PT Time Calculation (min) (ACUTE ONLY): 33 min  Charges:  $Gait Training: 8-22 mins $Therapeutic Activity: 8-22 mins                     Ann Held PT, DPT Acute Rehab [facility] Rehabilitation P: [phone]    Katelyn Lamb 12/09/2019, 6:38 PM

## 2019-12-09 NOTE — Progress Notes (Signed)
Progress Note    Katelyn Lamb  [id number] DOB: 1946/03/19  DOA: 12/06/2019 PCP: [name], MD    Brief Narrative:   Chief complaint: fall/pelvic fx  Medical records reviewed and are as summarized below:  Katelyn Lamb is an 74 y.o. 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  Work-up in the emergency department reveals oxygen saturation level of 87% on room air, mild tachypnea, left pubic bone/pubic ramus fracture superimposed on old left inferior pubic ramus fractures.  Also with left hand laceration.  Assessment/Plan:   Active Problems:   Pelvic fracture (HCC)   AKI (acute kidney injury) ([place])   Hypoxia   Laceration of left hand   Ataxia   Encephalomyelitis   Neuropathic pain   Neurogenic bladder   Gait abnormality   Chronic pain syndrome   1.  Pelvic fracture secondary to fall.  Patient with a history of recurrent falls related to sensory ataxia due to history of viral meningeal encephalitis.  X-ray reveals acute fracture left superior and inferior pubic rami.  CT of the head and C-spine were unremarkable.  Case discussed with Ortho in the emergency department who recommended weightbearing as tolerated and outpatient follow-up in 4 weeks.  Evaluated by physical therapy who recommends CIR.  Await CIR approval/admission -Continue pain management -Mobilize as able -CIR  #2.  Hypoxia/atelectasis.  Upon presentation patient's oxygen saturation level at room air was 87%.  Chest x-ray revealed some atelectasis without infiltrate.  Oxygen supplementation provided and sats went up to 92% today on room air at rest oxygen saturation level 91%.  Cough no shortness of breath -Continue incentive  spirometry -Mobilize as able -Monitor  #3.  Acute kidney injury.  Creatinine was 1.18 on admission.  Chart review indicates her baseline is close to 0.8.  Was provided with gentle IV fluids.  Resolved.  #4.  Left hand laceration.  Stitches provided in the emergency department.  X-ray showed no fracture or dislocation.  Dressing dry and intact she remains afebrile nontoxic-appearing. -suture removal 1 week -monitor  #5.  Chronic urinary retention/neurogenic bladder.  History of same.  Also history of recurrent UTIs.  Home medications include Bactrim. -Continue Bactrim -Monitor  #6.  History of HSV and encephalomyelitis.  Stable at baseline. -Continue acyclovir  #7.  Chronic pain.  Home medications include Lyrica and Norco.  Stable -Continue home meds  #8.  Insomnia.  Home medications include Seroquel.  Stable -Continue home meds  #9.  GERD.  Stable -Continue PPI     Family Communication/Anticipated D/C date and plan/Code Status   DVT prophylaxis: heparin ordered. Code Status: dnr  Family Communication: husband at bedside Disposition Plan: CIR hopefully   Medical Consultants:    None.   Anti-Infectives:    Bactrim   Subjective:   Awake alert.  Reports continued pain but states managed well.  Slept some during the night denies nausea  Objective:    Vitals:   12/08/19 1352 12/08/19 1940 12/09/19 0500 12/09/19 0826  BP: 107/61 110/64 108/64 115/75  Pulse: 82 80 80 97  Resp: 16 16  17   Temp: 98.3 F (36.8 C) 98.2 F (36.8 C) 98.2 F (36.8 C) 98.6 F (37 C)  TempSrc: Oral Oral Oral Oral  SpO2: 91% 93%  93% 91%  Weight:      Height:        Intake/Output Summary (Last 24 hours) at 12/09/2019 1102 Last data filed at 12/08/2019 1632 Gross per 24 hour  Intake 420 ml  Output 1050 ml  Net -630 ml   Filed Weights   12/06/19 2100  Weight: 72.4 kg    Exam: General: Awake alert well-nourished appears chronically ill CV: Regular rate and rhythm no murmur  gallop or rub, no lower extremity edema Respiratory: Respirations slightly shallow but breath sounds are clear bilaterally I hear no wheeze no crackles Abdomen: Nondistended positive bowel sounds nontender to palpation no guarding or rebounding Musculoskeletal: Joints without swelling/erythema dressing to her left hand dry and intact left fingers are slightly swollen and quite bruised but warm to the touch pulses are palpable full range of motion Neuro: Alert and oriented x3 speech clear facial symmetry  Data Reviewed:   I have personally reviewed following labs and imaging studies:  Labs: Labs show the following:   Basic Metabolic Panel: Recent Labs  Lab 12/06/19 1605 12/06/19 1605 12/07/19 0355 12/08/19 0524  NA 140  --  140 140  K 3.8   < > 3.9 3.9  CL 101  --  101 103  CO2 29  --  27 28  GLUCOSE 106*  --  100* 93  BUN 11  --  12 13  CREATININE 1.18*  --  1.06* 0.88  CALCIUM 9.0  --  8.4* 8.8*   < > = values in this interval not displayed.   GFR Estimated Creatinine Clearance: 57.7 mL/min (by C-G formula based on SCr of 0.88 mg/dL). Liver Function Tests: Recent Labs  Lab 12/06/19 1605  AST 35  ALT 23  ALKPHOS 121  BILITOT 0.4  PROT 6.6  ALBUMIN 3.4*   No results for input(s): LIPASE, AMYLASE in the last 168 hours. No results for input(s): AMMONIA in the last 168 hours. Coagulation profile No results for input(s): INR, PROTIME in the last 168 hours.  CBC: Recent Labs  Lab 12/06/19 1605 12/07/19 0355 12/08/19 0524  WBC 11.7* 7.8 6.8  HGB 12.1 11.2* 11.5*  HCT 39.3 35.0* 36.3  MCV 98.7 97.5 97.8  PLT 202 182 166   Cardiac Enzymes: No results for input(s): CKTOTAL, CKMB, CKMBINDEX, TROPONINI in the last 168 hours. BNP (last 3 results) No results for input(s): PROBNP in the last 8760 hours. CBG: No results for input(s): GLUCAP in the last 168 hours. D-Dimer: No results for input(s): DDIMER in the last 72 hours. Hgb A1c: No results for input(s):  HGBA1C in the last 72 hours. Lipid Profile: No results for input(s): CHOL, HDL, LDLCALC, TRIG, CHOLHDL, LDLDIRECT in the last 72 hours. Thyroid function studies: No results for input(s): TSH, T4TOTAL, T3FREE, THYROIDAB in the last 72 hours.  Invalid input(s): FREET3 Anemia work up: No results for input(s): VITAMINB12, FOLATE, FERRITIN, TIBC, IRON, RETICCTPCT in the last 72 hours. Sepsis Labs: Recent Labs  Lab 12/06/19 1605 12/07/19 0355 12/08/19 0524  WBC 11.7* 7.8 6.8    Microbiology Recent Results (from the past 240 hour(s))  SARS CORONAVIRUS 2 (TAT 6-24 HRS) Nasopharyngeal Nasopharyngeal Swab     Status: None   Collection Time: 12/06/19  3:59 PM   Specimen: Nasopharyngeal Swab  Result Value Ref Range Status   SARS Coronavirus 2 NEGATIVE NEGATIVE Final    Comment: (NOTE) SARS-CoV-2 target nucleic acids are NOT DETECTED. The SARS-CoV-2 RNA is generally detectable in upper and lower respiratory specimens  during the acute phase of infection. Negative results do not preclude SARS-CoV-2 infection, do not rule out co-infections with other pathogens, and should not be used as the sole basis for treatment or other patient management decisions. Negative results must be combined with clinical observations, patient history, and epidemiological information. The expected result is Negative. Fact Sheet for Patients: SugarRoll.be Fact Sheet for Healthcare Providers: https://www.woods-mathews.com/ This test is not yet approved or cleared by the [place] FDA and  has been authorized for detection and/or diagnosis of SARS-CoV-2 by FDA under an Emergency Use Authorization (EUA). This EUA will remain  in effect (meaning this test can be used) for the duration of the COVID-19 declaration under Section 56 4(b)(1) of the Act, 21 U.S.C. section 360bbb-3(b)(1), unless the authorization is terminated or revoked sooner. Performed at [facility], [place] [address] [place], [place] [address]     Procedures and diagnostic studies:  No results found.  Medications:   . acyclovir  400 mg Oral BID  . diazepam  5 mg Oral BID  . DULoxetine  30 mg Oral q morning - 10a  . DULoxetine  60 mg Oral QHS  . heparin  5,000 Units Subcutaneous Q8H  . lidocaine-EPINEPHrine  10 mL Infiltration Once  . pantoprazole  40 mg Oral QHS  . polyethylene glycol  17 g Oral Daily  . pregabalin  100 mg Oral QHS  . QUEtiapine  25 mg Oral QHS  . sulfamethoxazole-trimethoprim  0.5 tablet Oral Daily   Continuous Infusions:   LOS: 2 days   Radene Gunning NP  [facility]   How to contact the [facility] Attending or Consulting provider [place] or covering provider during after hours [place], for this patient?  1. Check the care team in [facility] and look for a) attending/consulting TR[facility] provider listed and b) the [facility] team listed 2. Log into [email or web address] and use [place]'s universal password to access. If you do not have the password, please contact the hospital operator. 3. Locate the [facility] provider you are looking for under [facility] and page to a number that you can be directly reached. 4. If you still have difficulty reaching the provider, please page the [facility] (Director on Call) for the Hospitalists listed on amion for assistance.  12/09/2019, 11:02 AM

## 2019-12-09 NOTE — Progress Notes (Signed)
Inpatient Rehab Admissions:  Inpatient Rehab Consult received.  I met with patient and her husband at the bedside for rehabilitation assessment and to discuss goals and expectations of an inpatient rehab admission.  Both are very hopeful for CIR level therapy, as they are familiar with our program (pt was on CIR in 10/2016 for C3-4 incomplete tetraplegia).  Will open insurance for prior authorization for potential admission pending approval.   Signed: Shann Medal, PT, DPT Admissions Coordinator [phone] 12/09/19  3:23 PM

## 2019-12-10 MED ORDER — OXYCODONE HCL 5 MG PO TABS
5.0000 mg | ORAL_TABLET | Freq: Four times a day (QID) | ORAL | Status: DC | PRN
  Administered 2019-12-10 – 2019-12-11 (×3): 5 mg via ORAL
  Filled 2019-12-10 (×3): qty 1

## 2019-12-10 MED ORDER — DICLOFENAC SODIUM 1 % EX GEL
2.0000 g | Freq: Four times a day (QID) | CUTANEOUS | Status: DC
  Administered 2019-12-10 – 2019-12-17 (×15): 2 g via TOPICAL
  Filled 2019-12-10: qty 100

## 2019-12-10 NOTE — Plan of Care (Signed)
Plan of care reviewed with pt and daughter at bedside. VSS, pain controlled with medications per orders. Up with x2 assist, very unsteady to [facility].  Call bell in reach, alarms activated. Pt stable at this time, will continue to monitor.  Problem: Clinical Measurements: Goal: Will remain free from infection Outcome: Progressing   Problem: Elimination: Goal: Will not experience complications related to bowel motility Outcome: Progressing   Problem: Pain Managment: Goal: General experience of comfort will improve Outcome: Progressing   Problem: Safety: Goal: Ability to remain free from injury will improve Outcome: Progressing

## 2019-12-10 NOTE — Progress Notes (Signed)
Physical Therapy Treatment Patient Details Name: Katelyn Lamb MRN: [id number] DOB: [birth year] Today's Date: 12/10/2019    History of Present Illness Katelyn Lamb is a 74 y.o. 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 Imaging: CT head negative for acute intracranial abnormalities, CT cervical spine showed normal alignment of cervical vertebral bodies. X-ray of left hand no fractures or dislocations.    PT Comments    Pt seated on commode on entry.  Pt required assistance with various DME to transfer.  Ultimately L PFRW worked best for her and she was able to progress to gait training.  Continue to recommend rehab in a post acute setting to maximize functional gains before return home.  She currently requires moderate assistance to mobilize.      Follow Up Recommendations  CIR     Equipment Recommendations  Other (comment)    Recommendations for Other Services Rehab consult     Precautions / Restrictions Precautions Precautions: Fall Precaution Comments: spouse reports pt told to avoid L hand composite flexion to allow skin to heal and to maintain suture placement Restrictions Weight Bearing Restrictions: Yes Other Position/Activity Restrictions: WBAT    Mobility  Bed Mobility Overal bed mobility: Needs Assistance Bed Mobility: Supine to Sit     Supine to sit: Mod assist;HOB elevated     General bed mobility comments: pt up on BSC at start of session  Transfers Overall transfer level: Needs assistance Equipment used: Rolling walker (2 wheeled);Left platform walker(also used sara stedy) Transfers: Sit to/from Stand Sit to Stand: Mod assist;Min assist Stand  pivot transfers: +2 safety/equipment;Mod assist       General transfer comment: Pt performed in sara stedy from commode and from bed with RW.    She required cues for hand placement.  After standing with RW she was unable to offweight LEs with LUE so performed additional transfer with L PFRW  Ambulation/Gait Ambulation/Gait assistance: +2 safety/equipment;Mod assist Gait Distance (Feet): 12 Feet Assistive device: Left platform walker Gait Pattern/deviations: Step-to pattern;Shuffle;Narrow base of support;Trunk flexed Gait velocity: decreased   General Gait Details: Pt unable to weight shift with RW required L platform RW to allow for gt progression.  Pt required assistance to maintain stance.  She fatigues and knees start to flex and required cues for postural awareness.   Stairs             Wheelchair Mobility    Modified Rankin (Stroke Patients Only)       Balance Overall balance assessment: Needs assistance Sitting-balance support: Bilateral upper extremity supported Sitting balance-Leahy Scale: Fair     Standing balance support: Bilateral upper extremity supported Standing balance-Leahy Scale: Poor Standing balance comment: Requires BUE support                            Cognition Arousal/Alertness: Awake/alert Behavior During Therapy: WFL for tasks assessed/performed Overall Cognitive Status: Impaired/Different from baseline Area of Impairment: Orientation;Attention;Memory;Following commands;Problem solving;Awareness                 Orientation Level: Disoriented to;Time Current Attention Level: Sustained Memory: Decreased short-term memory Following Commands: Follows one step commands consistently;Follows multi-step commands inconsistently   Awareness: Emergent Problem Solving: Slow  processing;Difficulty sequencing;Decreased initiation;Requires verbal cues;Requires tactile cues General Comments: Spouse reports intermittent levels of  cognitive deficits stating today seems like a good day.       Exercises      General Comments        Pertinent Vitals/Pain Pain Assessment: Faces Faces Pain Scale: Hurts little more Pain Location: Lt hip, B pelvis Pain Descriptors / Indicators: Aching;Dull;Guarding;Grimacing Pain Intervention(s): Monitored during session;Repositioned    Home Living                      Prior Function            PT Goals (current goals can now be found in the care plan section) Acute Rehab PT Goals Patient Stated Goal: go to rehab and get stronger Potential to Achieve Goals: Fair Progress towards PT goals: Progressing toward goals    Frequency    Min 5X/week      PT Plan Current plan remains appropriate    Co-evaluation              AM-PAC PT "6 Clicks" Mobility   Outcome Measure  Help needed turning from your back to your side while in a flat bed without using bedrails?: A Little Help needed moving from lying on your back to sitting on the side of a flat bed without using bedrails?: A Little Help needed moving to and from a bed to a chair (including a wheelchair)?: A Little Help needed standing up from a chair using your arms (e.g., wheelchair or bedside chair)?: A Little Help needed to walk in hospital room?: A Lot Help needed climbing 3-5 steps with a railing? : Total 6 Click Score: 15    End of Session Equipment Utilized During Treatment: Gait belt Activity Tolerance: Patient tolerated treatment well Patient left: in chair;with call bell/phone within reach;with chair alarm set;with family/visitor present;Other (comment) Nurse Communication: Mobility status PT Visit Diagnosis: Unsteadiness on feet (R26.81);Muscle weakness (generalized) (M62.81);History of falling (Z91.81);Difficulty in walking, not elsewhere classified (R26.2);Pain Pain - Right/Left: Right Pain - part of body: Hip     Time: [id number] PT Time Calculation (min) (ACUTE ONLY): 34  min  Charges:  $Gait Training: 8-22 mins $Therapeutic Activity: 8-22 mins                     Erasmo Leventhal , PTA Acute Rehabilitation Services Pager [phone] Office [phone]     Katelyn Lamb Eli Hose 12/10/2019, 4:40 PM

## 2019-12-10 NOTE — Progress Notes (Addendum)
Inpatient Rehab Admissions Coordinator:   Received denial from [employer] for inpatient rehab authorization request, based on lack of acute medical necessity.  I did let pt and family know.  They are potentially interested in filing an appeal, and I will provide them with the information they need to do so this afternoon.  They do not appear to be interested in SNF level rehab at this time.  CIR will sign off.  CM aware.   Shann Medal, PT, DPT Admissions Coordinator [phone] 12/10/19  1:52 PM

## 2019-12-10 NOTE — Care Management Important Message (Signed)
Important Message  Patient Details  Name: Katelyn Lamb MRN: [id number] Date of Birth: 02/25/1946   Medicare Important Message Given:  Yes     Celinda Dethlefs [address] 12/10/2019, 1:28 PM

## 2019-12-10 NOTE — Progress Notes (Signed)
PT Cancellation Note  Patient Details Name: Katelyn Lamb MRN: [id number] DOB: 1945/10/23   Cancelled Treatment:    Reason Eval/Treat Not Completed: (P) Other (comment)(Pt eating in bed on arrival.  Will f/u per POC.)   [name] Stann Mainland 12/10/2019, 12:23 PM Erasmo Leventhal , PTA Acute Rehabilitation Services Pager [phone] Office [phone]

## 2019-12-10 NOTE — Progress Notes (Signed)
Pt received denial for CIR. Peer to peer unsuccessful. NCM made aware by pt's husband pt is appealing insurance denial for CIR. States he has spoken with [employer] and they are emailing him a form for wife to sign, giving him consent to speak on her behalf. Both stated they are not considering next level of care ( SNF vs HH) until they know insurance appeal has been denied. TOC team will continue to monitor and follow... Whitman Hero RN,BSN,CM [phone]

## 2019-12-10 NOTE — Progress Notes (Signed)
Occupational Therapy Treatment Patient Details Name: Katelyn Lamb MRN: [id number] DOB: 09-02-1946 Today's Date: 12/10/2019    History of present illness Katelyn Lamb is a 74 y.o. 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 Imaging: CT head negative for acute intracranial abnormalities, CT cervical spine showed normal alignment of cervical vertebral bodies. X-ray of left hand no fractures or dislocations.   OT comments  Pt progressing towards acute OT goals. Completed 3x sit<>stand transfers with mod A while working on toilet transfer and pericare goals. Pt very motivated to work with therapy. Continue to recommend CIR.     Follow Up Recommendations  CIR;Supervision/Assistance - 24 hour    Equipment Recommendations  None recommended by OT    Recommendations for Other Services      Precautions / Restrictions Precautions Precautions: Fall Precaution Comments: spouse reports pt told to avoid L hand composite flexion to allow skin to heal and to maintain suture placement Restrictions Weight Bearing Restrictions: Yes Other Position/Activity Restrictions: WBAT       Mobility Bed Mobility               General bed mobility comments: pt up on BSC at start of session  Transfers Overall transfer level: Needs assistance Equipment used: Rolling walker (2 wheeled) Transfers: Sit to/from Stand Sit to Stand: Mod assist         General transfer comment: to/from [facility], stedy seat, and recliner. assist for powerup and to control descent into recliner    Balance Overall balance assessment: Needs assistance Sitting-balance support: Bilateral upper extremity  supported Sitting balance-Leahy Scale: Fair     Standing balance support: Bilateral upper extremity supported Standing balance-Leahy Scale: Poor Standing balance comment: Requires BUE support                           ADL either performed or assessed with clinical judgement   ADL Overall ADL's : Needs assistance/impaired                         Toilet Transfer: Moderate assistance;BSC(Sara Stedy) [employer] forces technical officer Details (indicate cue type and reason): utilized sara stedy this session as only +1 assist available. Pt able to complete sit<>stand at mod A level (powerup) and cues for hand plaecment. Pt able to stand with BUE support of stedy bar for about a minute as well.            General ADL Comments: Pt received on BSC. Completed a total of 3x sit<>stands throughout session at mod A level. Able to stand for about a minute using BUE support of stedy bar     Vision       Perception     Praxis      Cognition Arousal/Alertness: Awake/alert Behavior During Therapy: WFL for tasks assessed/performed Overall Cognitive Status: Impaired/Different from baseline Area of Impairment: Orientation;Attention;Memory;Following commands;Problem solving;Awareness                 Orientation Level: Disoriented to;Time Current Attention Level: Sustained Memory: Decreased short-term memory Following Commands: Follows one step commands consistently;Follows multi-step commands inconsistently   Awareness: Emergent Problem Solving: Slow processing;Difficulty sequencing;Decreased initiation;Requires verbal cues;Requires tactile cues General Comments: Spouse  reports intermittent levels of cognitive deficits stating today seems like a good day.         Exercises     Shoulder Instructions       General Comments      Pertinent Vitals/ Pain       Pain Assessment: Faces Faces Pain Scale: Hurts little more Pain Location: Lt hip Pain Descriptors / Indicators:  Aching;Dull;Guarding;Grimacing Pain Intervention(s): Limited activity within patient's tolerance;Monitored during session  Home Living                                          Prior Functioning/Environment              Frequency  Min 2X/week        Progress Toward Goals  OT Goals(current goals can now be found in the care plan section)  Progress towards OT goals: Progressing toward goals  Acute Rehab OT Goals Patient Stated Goal: go to rehab and get stronger OT Goal Formulation: With patient/family Time For Goal Achievement: 12/15/19 Potential to Achieve Goals: Good ADL Goals Pt Will Perform Grooming: with min assist;standing Pt Will Perform Upper Body Bathing: with set-up;with supervision;sitting Pt Will Perform Lower Body Bathing: sit to/from stand;with min assist Pt Will Perform Lower Body Dressing: with min assist;sit to/from stand Pt Will Transfer to Toilet: with min assist;ambulating;regular height toilet;bedside commode;grab bars Pt Will Perform Toileting - Clothing Manipulation and hygiene: with min assist  Plan Discharge plan remains appropriate    Co-evaluation                 AM-PAC OT "6 Clicks" Daily Activity     Outcome Measure   Help from another person eating meals?: A Little Help from another person taking care of personal grooming?: A Little Help from another person toileting, which includes using toliet, bedpan, or urinal?: A Lot Help from another person bathing (including washing, rinsing, drying)?: A Lot Help from another person to put on and taking off regular upper body clothing?: A Lot Help from another person to put on and taking off regular lower body clothing?: Total 6 Click Score: 13    End of Session Equipment Utilized During Treatment: Gait belt;Other (comment)(stedy)  OT Visit Diagnosis: Unsteadiness on feet (R26.81);Cognitive communication deficit (R41.841);Muscle weakness (generalized) (M62.81)    Activity Tolerance Patient tolerated treatment well   Patient Left in chair;with call bell/phone within reach;with family/visitor present   Nurse Communication          Time: 1300-1330 OT Time Calculation (min): 30 min  Charges: OT General Charges $OT Visit: 1 Visit OT Treatments $Self Care/Home Management : 23-37 mins  Tyrone Schimke, OT Acute Rehabilitation Services Pager: [phone] Office: [phone]    Hortencia Pilar 12/10/2019, 1:56 PM

## 2019-12-10 NOTE — Progress Notes (Signed)
Progress Note    Katelyn Lamb  [id number] DOB: 1945-09-21  DOA: 12/06/2019 PCP: [name], MD    Brief Narrative:   Chief complaint: fall/pelvic fracture  Medical records reviewed and are as summarized below:  Katelyn Lamb is an 74 y.o. 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  Work-up in the emergency department revealed oxygen saturation level of 87% on room air, mild tachypnea, left pubic bone/pubic ramus fracture superimposed on old left inferior pubic ramus fractures.  Also with left hand laceration  Assessment/Plan:   Active Problems:   Pelvic fracture (HCC)   AKI (acute kidney injury) ([place])   Hypoxia   Laceration of left hand   Ataxia   Encephalomyelitis   Neuropathic pain   Neurogenic bladder   Gait abnormality   Chronic pain syndrome   DNR (do not resuscitate)   1.  Pelvic fracture secondary to fall.  Patient with a history of recurrent falls related to sensory ataxia due to history of viral meningeal encephalitis.  X-ray reveald acute fracture left superior and inferior pubic rami.  CT of the head and C-spine were unremarkable.  Case discussed with Ortho in the emergency department who recommended weightbearing as tolerated and outpatient follow-up in 4 weeks.  Evaluated by physical therapy who recommends CIR. Insurance denied. Family and patient not interested in snf and looking to challenge denial. Pain management improved with scheduled tylenol, robaxin.  -Continue pain management -Mobilize as able   #2.  Hypoxia/atelectasis.  Resolved.  Upon presentation patient's oxygen saturation level at room air was 87%.  Chest x-ray revealed some atelectasis without infiltrate.   Oxygen supplementation provided and sats went up to 92% today on room air at rest oxygen saturation level 91%.  Cough no shortness of breath -Continue incentive spirometry -Mobilize as able -Monitor  #3.  Acute kidney injury.  Creatinine was 1.18 on admission.  Chart review indicates her baseline is close to 0.8.  Was provided with gentle IV fluids.  Resolved.  #4.  Left hand laceration.  Stitches provided in the emergency department.  X-ray showed no fracture or dislocation.  Dressing dry and intact she remains afebrile nontoxic-appearing. -suture removal 1 week -monitor  #5.  Chronic urinary retention/neurogenic bladder.  History of same.  Also history of recurrent UTIs.  Home medications include Bactrim. -Continue Bactrim -Monitor  #6.  History of HSV and encephalomyelitis.  Stable at baseline. -Continue acyclovir  #7.  Chronic pain.  Home medications include Lyrica and Norco.  Stable -Continue home meds -voltaren  #8.  Insomnia.  Home medications include Seroquel.  Stable -Continue home meds  #9.  GERD.  Stable -Continue PPI   Family Communication/Anticipated D/C date and plan/Code Status   DVT prophylaxis: heparin ordered. Code Status: dnr Family Communication: husband at bedside Disposition Plan: to be determined   Medical Consultants:    None.   Anti-Infectives:    bactrim  Subjective:   sitting  Objective:    Vitals:   12/09/19 1520 12/09/19 2103 12/10/19 0355 12/10/19 0722  BP: 99/66 113/73 111/77 107/75  Pulse: 81 80 82 82  Resp: 17 16 16 18   Temp: 97.6 F (36.4 C) (!) 97.4 F (36.3 C) 98.4 F (36.9 C) 98.3 F (36.8 C)  TempSrc:  Oral Oral Oral Oral  SpO2: 90% 95% 92% 90%  Weight:      Height:        Intake/Output Summary (Last 24 hours) at 12/10/2019 1358 Last data filed at 12/10/2019 1235 Gross per 24 hour  Intake 400 ml  Output 1000 ml  Net -600 ml   Filed Weights   12/06/19 2100  Weight: 72.4 kg    Exam: General:  Awake alert well-nourished appears chronically ill CV: Regular rate and rhythm no murmur gallop or rub, no lower extremity edema Respiratory: Respirations slightly shallow but breath sounds are clear bilaterally I hear no wheeze no crackles Abdomen: Nondistended positive bowel sounds nontender to palpation no guarding or rebounding Musculoskeletal: Joints without swelling/erythema dressing to her left hand dry and intact left fingers are slightly swollen and quite bruised but warm to the touch pulses are palpable full range of motion Neuro: Alert and oriented x3 speech clear facial symmetry    Data Reviewed:   I have personally reviewed following labs and imaging studies:  Labs: Labs show the following:   Basic Metabolic Panel: Recent Labs  Lab 12/06/19 1605 12/06/19 1605 12/07/19 0355 12/08/19 0524  NA 140  --  140 140  K 3.8   < > 3.9 3.9  CL 101  --  101 103  CO2 29  --  27 28  GLUCOSE 106*  --  100* 93  BUN 11  --  12 13  CREATININE 1.18*  --  1.06* 0.88  CALCIUM 9.0  --  8.4* 8.8*   < > = values in this interval not displayed.   GFR Estimated Creatinine Clearance: 57.7 mL/min (by C-G formula based on SCr of 0.88 mg/dL). Liver Function Tests: Recent Labs  Lab 12/06/19 1605  AST 35  ALT 23  ALKPHOS 121  BILITOT 0.4  PROT 6.6  ALBUMIN 3.4*   No results for input(s): LIPASE, AMYLASE in the last 168 hours. No results for input(s): AMMONIA in the last 168 hours. Coagulation profile No results for input(s): INR, PROTIME in the last 168 hours.  CBC: Recent Labs  Lab 12/06/19 1605 12/07/19 0355 12/08/19 0524  WBC 11.7* 7.8 6.8  HGB 12.1 11.2* 11.5*  HCT 39.3 35.0* 36.3  MCV 98.7 97.5 97.8  PLT 202 182 166   Cardiac Enzymes: No results for input(s): CKTOTAL, CKMB, CKMBINDEX, TROPONINI in the last 168 hours. BNP (last 3 results) No results for input(s): PROBNP in the last 8760 hours. CBG: No results for input(s): GLUCAP in the last 168  hours. D-Dimer: No results for input(s): DDIMER in the last 72 hours. Hgb A1c: No results for input(s): HGBA1C in the last 72 hours. Lipid Profile: No results for input(s): CHOL, HDL, LDLCALC, TRIG, CHOLHDL, LDLDIRECT in the last 72 hours. Thyroid function studies: No results for input(s): TSH, T4TOTAL, T3FREE, THYROIDAB in the last 72 hours.  Invalid input(s): FREET3 Anemia work up: No results for input(s): VITAMINB12, FOLATE, FERRITIN, TIBC, IRON, RETICCTPCT in the last 72 hours. Sepsis Labs: Recent Labs  Lab 12/06/19 1605 12/07/19 0355 12/08/19 0524  WBC 11.7* 7.8 6.8    Microbiology Recent Results (from the past 240 hour(s))  SARS CORONAVIRUS 2 (TAT 6-24 HRS) Nasopharyngeal Nasopharyngeal Swab     Status: None   Collection Time: 12/06/19  3:59 PM   Specimen: Nasopharyngeal Swab  Result Value Ref Range Status   SARS Coronavirus 2 NEGATIVE NEGATIVE Final    Comment: (NOTE) SARS-CoV-2 target nucleic acids are NOT DETECTED. The SARS-CoV-2  RNA is generally detectable in upper and lower respiratory specimens during the acute phase of infection. Negative results do not preclude SARS-CoV-2 infection, do not rule out co-infections with other pathogens, and should not be used as the sole basis for treatment or other patient management decisions. Negative results must be combined with clinical observations, patient history, and epidemiological information. The expected result is Negative. Fact Sheet for Patients: SugarRoll.be Fact Sheet for Healthcare Providers: https://www.woods-mathews.com/ This test is not yet approved or cleared by the [place] FDA and  has been authorized for detection and/or diagnosis of SARS-CoV-2 by FDA under an Emergency Use Authorization (EUA). This EUA will remain  in effect (meaning this test can be used) for the duration of the COVID-19 declaration under Section 56 4(b)(1) of the Act, 21 U.S.C. section  360bbb-3(b)(1), unless the authorization is terminated or revoked sooner. Performed at [facility], [place] [address], [place], [place] [address]     Procedures and diagnostic studies:  No results found.  Medications:   . acetaminophen  1,000 mg Oral TID  . acyclovir  400 mg Oral BID  . diazepam  5 mg Oral BID  . diclofenac Sodium  2 g Topical QID  . DULoxetine  30 mg Oral q morning - 10a  . DULoxetine  60 mg Oral QHS  . heparin  5,000 Units Subcutaneous Q8H  . lidocaine-EPINEPHrine  10 mL Infiltration Once  . methocarbamol  500 mg Oral TID  . pantoprazole  40 mg Oral QHS  . polyethylene glycol  17 g Oral Daily  . pregabalin  100 mg Oral QHS  . QUEtiapine  25 mg Oral QHS  . sulfamethoxazole-trimethoprim  0.5 tablet Oral Daily   Continuous Infusions:   LOS: 3 days   Radene Gunning NP [facility]   How to contact the [facility] Attending or Consulting provider [place] or covering provider during after hours [place], for this patient?  1. Check the care team in [facility] and look for a) attending/consulting [facility] provider listed and b) the [facility] team listed 2. Log into [email or web address] and use [place]'s universal password to access. If you do not have the password, please contact the hospital operator. 3. Locate the [facility] provider you are looking for under [facility] and page to a number that you can be directly reached. 4. If you still have difficulty reaching the provider, please page the [facility] (Director on Call) for the Hospitalists listed on amion for assistance.  12/10/2019, 1:58 PM

## 2019-12-11 MED ORDER — METHOCARBAMOL 500 MG PO TABS
500.0000 mg | ORAL_TABLET | Freq: Three times a day (TID) | ORAL | Status: AC
  Administered 2019-12-11 – 2019-12-14 (×11): 500 mg via ORAL
  Filled 2019-12-11 (×11): qty 1

## 2019-12-11 MED ORDER — OXYCODONE HCL 5 MG PO TABS
2.5000 mg | ORAL_TABLET | Freq: Four times a day (QID) | ORAL | Status: DC | PRN
  Administered 2019-12-12 – 2019-12-18 (×13): 5 mg via ORAL
  Filled 2019-12-11 (×14): qty 1

## 2019-12-11 NOTE — Progress Notes (Signed)
Physical Therapy Treatment Patient Details Name: Katelyn Lamb MRN: [id number] DOB: [birth year] Today's Date: 12/11/2019    History of Present Illness Katelyn Lamb is a 74 y.o. 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 Imaging: CT head negative for acute intracranial abnormalities, CT cervical spine showed normal alignment of cervical vertebral bodies. X-ray of left hand no fractures or dislocations.    PT Comments    Pt supine in bed on arrival.  She required assistance to move to edge of bed, into standing and for gt progression.  Pt continues to require mod +2.  Pt is an excellent candidate for aggressive rehab in a post acute setting.  Continue to recommend CIR.      Follow Up Recommendations  CIR     Equipment Recommendations  Other (comment)    Recommendations for Other Services Rehab consult     Precautions / Restrictions Precautions Precautions: Fall Precaution Comments: spouse reports pt told to avoid L hand composite flexion to allow skin to heal and to maintain suture placement Restrictions Weight Bearing Restrictions: Yes Other Position/Activity Restrictions: WBAT    Mobility  Bed Mobility Overal bed mobility: Needs Assistance Bed Mobility: Supine to Sit     Supine to sit: Mod assist;HOB elevated     General bed mobility comments: Assistance to move to edge of bed for LE advancement and trunk elevation.  Transfers Overall transfer level: Needs assistance Equipment used: Left platform walker Transfers: Sit to/from Stand Sit to Stand: Mod assist;+2 physical assistance         General transfer comment: Mod +2 to rise into standing with  posterior bias.  Cues for hand placement and forward weight shifting.  Ambulation/Gait Ambulation/Gait assistance: Mod assist;+2 physical assistance Gait Distance (Feet): 6 Feet(+ 15 ft) Assistive device: Left platform walker Gait Pattern/deviations: Step-to pattern;Shuffle;Narrow base of support;Trunk flexed     General Gait Details: Pt with improve weight shifting this session and able to lift R foot better 50 % of the time.  She continues to fatigue quickly.   Stairs             Wheelchair Mobility    Modified Rankin (Stroke Patients Only)       Balance Overall balance assessment: Needs assistance Sitting-balance support: Bilateral upper extremity supported Sitting balance-Leahy Scale: Fair       Standing balance-Leahy Scale: Poor                              Cognition Arousal/Alertness: Awake/alert Behavior During Therapy: WFL for tasks assessed/performed Overall Cognitive Status: Impaired/Different from baseline Area of Impairment: Orientation;Attention;Memory;Following commands;Problem solving;Awareness                 Orientation Level: Disoriented to;Time Current Attention Level: Sustained Memory: Decreased short-term memory Following Commands: Follows one step commands consistently;Follows multi-step commands inconsistently   Awareness: Emergent Problem Solving: Slow processing;Difficulty sequencing;Decreased initiation;Requires verbal cues;Requires tactile cues General Comments: Spouse reports intermittent levels of cognitive deficits stating today seems like a good day.       Exercises      General Comments        Pertinent Vitals/Pain Pain Assessment: Faces Pain Score: 3  Faces Pain  Scale: Hurts little more Pain Location: Lt hip, B pelvis Pain Descriptors / Indicators: Aching;Dull;Guarding;Grimacing Pain Intervention(s): Monitored during session;Repositioned    Home Living                      Prior Function             PT Goals (current goals can now be found in the care plan section) Acute Rehab PT Goals Patient Stated Goal: go to rehab and get stronger Potential to Achieve Goals: Fair Progress towards PT goals: Progressing toward goals    Frequency    Min 5X/week      PT Plan Current plan remains appropriate    Co-evaluation              AM-PAC PT "6 Clicks" Mobility   Outcome Measure  Help needed turning from your back to your side while in a flat bed without using bedrails?: A Little Help needed moving from lying on your back to sitting on the side of a flat bed without using bedrails?: A Little Help needed moving to and from a bed to a chair (including a wheelchair)?: A Lot Help needed standing up from a chair using your arms (e.g., wheelchair or bedside chair)?: A Lot Help needed to walk in hospital room?: A Lot Help needed climbing 3-5 steps with a railing? : Total 6 Click Score: 13    End of Session Equipment Utilized During Treatment: Gait belt Activity Tolerance: Patient tolerated treatment well Patient left: in chair;with call bell/phone within reach;with chair alarm set;with family/visitor present;Other (comment) Nurse Communication: Mobility status PT Visit Diagnosis: Unsteadiness on feet (R26.81);Muscle weakness (generalized) (M62.81);History of falling (Z91.81);Difficulty in walking, not elsewhere classified (R26.2);Pain Pain - Right/Left: Right Pain - part of body: Hip     Time: [id number] PT Time Calculation (min) (ACUTE ONLY): 25 min  Charges:  $Gait Training: 8-22 mins $Therapeutic Activity: 8-22 mins                     Erasmo Leventhal , PTA Acute Rehabilitation Services Pager [phone] Office [phone]    Winni Ehrhard Eli Hose 12/11/2019, 5:41 PM

## 2019-12-11 NOTE — Progress Notes (Addendum)
Progress Note    Katelyn Lamb  [id number] DOB: 18-Jul-1946  DOA: 12/06/2019 PCP: [name], MD    Brief Narrative:   Chief complaint: fall/pelvic fracture  Medical records reviewed and are as summarized below:  Katelyn Lamb is an 74 y.o. 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  Work-up in the emergency department revealed oxygen saturation level of 87% on room air, mild tachypnea, left pubic bone/pubic ramus fracture superimposed on old left inferior pubic ramus fractures.  Also with left hand laceration. PT recommended CIR but insurance declined. Family currently in process of appealing.     Assessment/Plan:   Active Problems:   Pelvic fracture (HCC)   AKI (acute kidney injury) ([place])   Hypoxia   Laceration of left hand   Ataxia   Encephalomyelitis   Neuropathic pain   Neurogenic bladder   Gait abnormality   Chronic pain syndrome   DNR (do not resuscitate)  1.  Pelvic fracture secondary to fall.  Patient with a history of recurrent falls related to sensory ataxia due to history of viral meningeal encephalitis.  X-ray reveald acute fracture left superior and inferior pubic rami.  CT of the head and C-spine were unremarkable.  Case discussed with Ortho in the emergency department who recommended weightbearing as tolerated and outpatient follow-up in 4 weeks.  Evaluated by physical therapy who recommends CIR. Insurance denied. Family and patient not interested in snf and looking to appeal denial. TOC following. Patient and family do not want snf or HH. Pain management improved with scheduled tylenol, robaxin.  -Continue pain management -Mobilize as able    #2.  Hypoxia/atelectasis.  Resolved.   Upon presentation patient's oxygen saturation level at room air was 87%.  Chest x-ray revealed some atelectasis without infiltrate.  Oxygen supplementation provided and sats went up to 92% today on room air at rest oxygen saturation level 91%.  Cough no shortness of breath -Continue incentive spirometry -Mobilize as able -Monitor   #3.  Acute kidney injury.  Creatinine was 1.18 on admission.  Chart review indicates her baseline is close to 0.8.  Was provided with gentle IV fluids.  Resolved.   #4.  Left hand laceration.  Stitches provided in the emergency department.  X-ray showed no fracture or dislocation.  Dressing dry and intact she remains afebrile nontoxic-appearing. -suture removal 1 week -monitor   #5.  Chronic urinary retention/neurogenic bladder.  History of same.  Also history of recurrent UTIs.  Home medications include Bactrim. -Continue Bactrim -Monitor   #6.  History of HSV and encephalomyelitis.  Stable at baseline. -Continue acyclovir   #7.  Chronic pain.  Home medications include Lyrica and Norco.  Stable -Continue home meds -voltaren   #8.  Insomnia.  Home medications include Seroquel.  Stable -Continue home meds   #9.  GERD.  Stable -Continue PPI     Family Communication/Anticipated D/C date and plan/Code Status   DVT prophylaxis: heparin ordered. Code Status: dnr Family Communication: granddaughter at bedside Disposition Plan: to be determined as CIR denied by insurance and appeal in process   Medical Consultants:   orthopedics    Subjective:   Awake alert preparing for breakfast. Reports resting well through night  Objective:    Vitals:  12/10/19 1409 12/10/19 2113 12/11/19 0325 12/11/19 0730  BP: 113/83 93/71 92/69  92/65  Pulse: 90 89 84 79  Resp: 18 16 16 16   Temp: 98 F (36.7 C) 98.3 F (36.8 C) 98.6 F (37 C) 98.4 F (36.9 C)  TempSrc: Oral Oral Oral Oral  SpO2: 95% 94% 92% 93%  Weight:      Height:        Intake/Output  Summary (Last 24 hours) at 12/11/2019 1216 Last data filed at 12/11/2019 0900 Gross per 24 hour  Intake 590 ml  Output 1200 ml  Net -610 ml   Filed Weights   12/06/19 2100  Weight: 72.4 kg    Exam: General: Awake alert well-nourished appears chronically ill CV: Regular rate and rhythm no murmur gallop or rub, no lower extremity edema Respiratory: Respirations slightly shallow but breath sounds are clear bilaterally I hear no wheeze no crackles Abdomen: Nondistended positive bowel sounds nontender to palpation no guarding or rebounding Musculoskeletal: Joints without swelling/erythema dressing to her left hand dry and intact left fingers are slightly swollen and quite bruised but warm to the touch pulses are palpable full range of motion Neuro: Alert and oriented x3 speech clear facial symmetry    Data Reviewed:   I have personally reviewed following labs and imaging studies:  Labs: Labs show the following:   Basic Metabolic Panel: Recent Labs  Lab 12/06/19 1605 12/06/19 1605 12/07/19 0355 12/08/19 0524  NA 140  --  140 140  K 3.8   < > 3.9 3.9  CL 101  --  101 103  CO2 29  --  27 28  GLUCOSE 106*  --  100* 93  BUN 11  --  12 13  CREATININE 1.18*  --  1.06* 0.88  CALCIUM 9.0  --  8.4* 8.8*   < > = values in this interval not displayed.   GFR Estimated Creatinine Clearance: 57.7 mL/min (by C-G formula based on SCr of 0.88 mg/dL). Liver Function Tests: Recent Labs  Lab 12/06/19 1605  AST 35  ALT 23  ALKPHOS 121  BILITOT 0.4  PROT 6.6  ALBUMIN 3.4*   No results for input(s): LIPASE, AMYLASE in the last 168 hours. No results for input(s): AMMONIA in the last 168 hours. Coagulation profile No results for input(s): INR, PROTIME in the last 168 hours.  CBC: Recent Labs  Lab 12/06/19 1605 12/07/19 0355 12/08/19 0524  WBC 11.7* 7.8 6.8  HGB 12.1 11.2* 11.5*  HCT 39.3 35.0* 36.3  MCV 98.7 97.5 97.8  PLT 202 182 166   Cardiac Enzymes: No results for  input(s): CKTOTAL, CKMB, CKMBINDEX, TROPONINI in the last 168 hours. BNP (last 3 results) No results for input(s): PROBNP in the last 8760 hours. CBG: No results for input(s): GLUCAP in the last 168 hours. D-Dimer: No results for input(s): DDIMER in the last 72 hours. Hgb A1c: No results for input(s): HGBA1C in the last 72 hours. Lipid Profile: No results for input(s): CHOL, HDL, LDLCALC, TRIG, CHOLHDL, LDLDIRECT in the last 72 hours. Thyroid function studies: No results for input(s): TSH, T4TOTAL, T3FREE, THYROIDAB in the last 72 hours.  Invalid input(s): FREET3 Anemia work up: No results for input(s): VITAMINB12, FOLATE, FERRITIN, TIBC, IRON, RETICCTPCT in the last 72 hours. Sepsis Labs: Recent Labs  Lab 12/06/19 1605 12/07/19 0355 12/08/19 0524  WBC 11.7* 7.8 6.8    Microbiology Recent Results (from the past 240 hour(s))  SARS CORONAVIRUS 2 (TAT 6-24 HRS) Nasopharyngeal Nasopharyngeal Swab  Status: None   Collection Time: 12/06/19  3:59 PM   Specimen: Nasopharyngeal Swab  Result Value Ref Range Status   SARS Coronavirus 2 NEGATIVE NEGATIVE Final    Comment: (NOTE) SARS-CoV-2 target nucleic acids are NOT DETECTED. The SARS-CoV-2 RNA is generally detectable in upper and lower respiratory specimens during the acute phase of infection. Negative results do not preclude SARS-CoV-2 infection, do not rule out co-infections with other pathogens, and should not be used as the sole basis for treatment or other patient management decisions. Negative results must be combined with clinical observations, patient history, and epidemiological information. The expected result is Negative. Fact Sheet for Patients: SugarRoll.be Fact Sheet for Healthcare Providers: https://www.woods-mathews.com/ This test is not yet approved or cleared by the [place] FDA and  has been authorized for detection and/or diagnosis of SARS-CoV-2 by FDA under  an Emergency Use Authorization (EUA). This EUA will remain  in effect (meaning this test can be used) for the duration of the COVID-19 declaration under Section 56 4(b)(1) of the Act, 21 U.S.C. section 360bbb-3(b)(1), unless the authorization is terminated or revoked sooner. Performed at [facility], [place] [address], [place], [place] [address]     Procedures and diagnostic studies:  No results found.  Medications:    acetaminophen  1,000 mg Oral TID   acyclovir  400 mg Oral BID   diazepam  5 mg Oral BID   diclofenac Sodium  2 g Topical QID   DULoxetine  30 mg Oral q morning - 10a   DULoxetine  60 mg Oral QHS   heparin  5,000 Units Subcutaneous Q8H   lidocaine-EPINEPHrine  10 mL Infiltration Once   methocarbamol  500 mg Oral TID   pantoprazole  40 mg Oral QHS   polyethylene glycol  17 g Oral Daily   pregabalin  100 mg Oral QHS   QUEtiapine  25 mg Oral QHS   sulfamethoxazole-trimethoprim  0.5 tablet Oral Daily   Continuous Infusions:    LOS: 4 days   Radene Gunning NP [facility]   How to contact the [facility] Attending or Consulting provider [place] or covering provider during after hours [place], for this patient?  Check the care team in [facility] and look for a) attending/consulting TRH provider listed and b) the [facility] team listed Log into [email or web address] and use [place]'s universal password to access. If you do not have the password, please contact the hospital operator. Locate the [facility] provider you are looking for under [facility] and page to a number that you can be directly reached. If you still have difficulty reaching the provider, please page the [facility] (Director on Call) for the Hospitalists listed on amion for assistance.  12/11/2019, 12:16 PM

## 2019-12-12 NOTE — Plan of Care (Signed)

## 2019-12-12 NOTE — Plan of Care (Signed)
  Problem: Pain Managment: Goal: General experience of comfort will improve Outcome: Progressing   

## 2019-12-12 NOTE — Progress Notes (Signed)
Progress Note    Katelyn Lamb  [id number] DOB: 04/13/1946  DOA: 12/06/2019 PCP: [name], MD    Brief Narrative:   Chief complaint: fall/pelvic fracture  Medical records reviewed and are as summarized below:  Katelyn Lamb is an 74 y.o. 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  Work-up in the emergency department revealed oxygen saturation level of 87% on room air, mild tachypnea, left pubic bone/pubic ramus fracture superimposed on old left inferior pubic ramus fractures.  Also with left hand laceration. PT recommended CIR but insurance declined. Family currently in process of appealing.     Assessment/Plan:   Active Problems:   Encephalomyelitis   Neuropathic pain   Neurogenic bladder   Ataxia   Gait abnormality   Chronic pain syndrome   Pelvic fracture (HCC)   AKI (acute kidney injury) ([place])   Hypoxia   Laceration of left hand   DNR (do not resuscitate)   1.  Pelvic fracture secondary to fall.  -Patient with a history of recurrent falls related to sensory ataxia due to history of viral meningeal encephalitis.   -X-ray revealed acute fracture left superior and inferior pubic rami.   -Case discussed with Ortho in the emergency department who recommended weightbearing as tolerated and outpatient follow-up in 4 weeks.   -Evaluated by physical therapy who recommends CIR. Insurance denied-family appealing decision -Pain management improved with scheduled tylenol, robaxin.  -Mobilize as able    2.  Hypoxia/atelectasis.  Resolved.   -Upon presentation patient's oxygen saturation level at room air was 87%.  Chest x-ray revealed some atelectasis without infiltrate.   -Continue incentive  spirometry -Mobilize as able   3.  Acute kidney injury.  Creatinine was 1.18 on admission.  Chart review indicates her baseline is close to 0.8.  Was provided with gentle IV fluids.  Resolved.   4.  Left hand laceration.  Stitches provided in the emergency department.  X-ray showed no fracture or dislocation.  Dressing dry and intact she remains afebrile nontoxic-appearing. -suture removal 1 week -monitor   5.  Chronic urinary retention/neurogenic bladder.  History of same.  Also history of recurrent UTIs.  Home medications include Bactrim. -Continue Bactrim   6.  History of HSV and encephalomyelitis.  Stable at baseline. -Continue acyclovir   7.  Chronic pain.  Home medications include Lyrica and Norco.  Stable -voltaren -While on scheduled Tylenol will change to oxycodone   8.  Insomnia.  Home medications include Seroquel.  Stable -Continue home meds   9.  GERD.  Stable -Continue PPI     Family Communication/Anticipated D/C date and plan/Code Status   DVT prophylaxis: heparin ordered. Code Status: dnr Family Communication: granddaughter at bedside 4/9 Disposition Plan: to be determined as CIR denied by insurance and appeal in process/TOC following   Medical Consultants:    Orthopedics  CIR    Subjective:   Denies any overnight events  Objective:    Vitals:   12/11/19 1327 12/11/19 2019 12/12/19 0500 12/12/19 0729  BP: 106/84 103/65 105/84 108/72  Pulse: 77 91 75 79  Resp: 17 15 14 17   Temp: 98.2 F (36.8 C) 98.5 F (36.9 C) 97.6 F (36.4 C) 98.1 F (36.7 C)  TempSrc: Oral Oral Oral Oral  SpO2: 95% 94% 97% 96%  Weight:      Height:        Intake/Output Summary (Last 24 hours) at 12/12/2019 0935 Last data filed at 12/12/2019 0500 Gross per 24 hour  Intake 720 ml  Output 1000 ml  Net -280 ml   Filed Weights   12/06/19 2100  Weight: 72.4 kg    Exam: In bed, chronically ill-appearing Pleasant cooperative Regular rate and rhythm No increased  work of breathing    Data Reviewed:   I have personally reviewed following labs and imaging studies:  Labs: Labs show the following:   Basic Metabolic Panel: Recent Labs  Lab 12/06/19 1605 12/06/19 1605 12/07/19 0355 12/08/19 0524  NA 140  --  140 140  K 3.8   < > 3.9 3.9  CL 101  --  101 103  CO2 29  --  27 28  GLUCOSE 106*  --  100* 93  BUN 11  --  12 13  CREATININE 1.18*  --  1.06* 0.88  CALCIUM 9.0  --  8.4* 8.8*   < > = values in this interval not displayed.   GFR Estimated Creatinine Clearance: 57.7 mL/min (by C-G formula based on SCr of 0.88 mg/dL). Liver Function Tests: Recent Labs  Lab 12/06/19 1605  AST 35  ALT 23  ALKPHOS 121  BILITOT 0.4  PROT 6.6  ALBUMIN 3.4*   No results for input(s): LIPASE, AMYLASE in the last 168 hours. No results for input(s): AMMONIA in the last 168 hours. Coagulation profile No results for input(s): INR, PROTIME in the last 168 hours.  CBC: Recent Labs  Lab 12/06/19 1605 12/07/19 0355 12/08/19 0524  WBC 11.7* 7.8 6.8  HGB 12.1 11.2* 11.5*  HCT 39.3 35.0* 36.3  MCV 98.7 97.5 97.8  PLT 202 182 166   Cardiac Enzymes: No results for input(s): CKTOTAL, CKMB, CKMBINDEX, TROPONINI in the last 168 hours. BNP (last 3 results) No results for input(s): PROBNP in the last 8760 hours. CBG: No results for input(s): GLUCAP in the last 168 hours. D-Dimer: No results for input(s): DDIMER in the last 72 hours. Hgb A1c: No results for input(s): HGBA1C in the last 72 hours. Lipid Profile: No results for input(s): CHOL, HDL, LDLCALC, TRIG, CHOLHDL, LDLDIRECT in the last 72 hours. Thyroid function studies: No results for input(s): TSH, T4TOTAL, T3FREE, THYROIDAB in the last 72 hours.  Invalid input(s): FREET3 Anemia work up: No results for input(s): VITAMINB12, FOLATE, FERRITIN, TIBC, IRON, RETICCTPCT in the last 72 hours. Sepsis Labs: Recent Labs  Lab 12/06/19 1605 12/07/19 0355 12/08/19 0524  WBC 11.7* 7.8 6.8     Microbiology Recent Results (from the past 240 hour(s))  SARS CORONAVIRUS 2 (TAT 6-24 HRS) Nasopharyngeal Nasopharyngeal Swab     Status: None   Collection Time: 12/06/19  3:59 PM   Specimen: Nasopharyngeal Swab  Result Value Ref Range Status   SARS Coronavirus 2 NEGATIVE NEGATIVE Final    Comment: (NOTE) SARS-CoV-2 target nucleic acids are NOT DETECTED. The SARS-CoV-2 RNA is generally detectable in upper and lower respiratory specimens during the acute phase of infection. Negative results do not preclude SARS-CoV-2 infection, do not rule out co-infections with other pathogens, and should not be used as the sole basis for treatment or other patient management decisions. Negative results must be combined with clinical observations, patient history, and epidemiological information. The expected result is Negative. Fact Sheet for Patients: SugarRoll.be Fact Sheet for Healthcare Providers: https://www.woods-mathews.com/ This test is not  yet approved or cleared by the [place] and  has been authorized for detection and/or diagnosis of SARS-CoV-2 by FDA under an Emergency Use Authorization (EUA). This EUA will remain  in effect (meaning this test can be used) for the duration of the COVID-19 declaration under Section 56 4(b)(1) of the Act, 21 U.S.C. section 360bbb-3(b)(1), unless the authorization is terminated or revoked sooner. Performed at [facility], [place] [address] [place], [place] [address]     Procedures and diagnostic studies:  No results found.  Medications:   . acetaminophen  1,000 mg Oral TID  . acyclovir  400 mg Oral BID  . diazepam  5 mg Oral BID  . diclofenac Sodium  2 g Topical QID  . DULoxetine  30 mg Oral q morning - 10a  . DULoxetine  60 mg Oral QHS  . heparin  5,000 Units Subcutaneous Q8H  . lidocaine-EPINEPHrine  10 mL Infiltration Once  . methocarbamol  500 mg Oral TID  . pantoprazole  40 mg  Oral QHS  . polyethylene glycol  17 g Oral Daily  . pregabalin  100 mg Oral QHS  . QUEtiapine  25 mg Oral QHS  . sulfamethoxazole-trimethoprim  0.5 tablet Oral Daily   Continuous Infusions:   LOS: 5 days   Geradine Girt DO [facility]   How to contact the [facility] Attending or Consulting provider [place] or covering provider during after hours [place], for this patient?  1. Check the care team in [facility] and look for a) attending/consulting TRH provider listed and b) the [facility] team listed 2. Log into [email or web address] and use [place]'s universal password to access. If you do not have the password, please contact the hospital operator. 3. Locate the [facility] provider you are looking for under [facility] and page to a number that you can be directly reached. 4. If you still have difficulty reaching the provider, please page the [facility] (Director on Call) for the Hospitalists listed on amion for assistance.  12/12/2019, 9:35 AM

## 2019-12-13 ENCOUNTER — Other Ambulatory Visit: Payer: Self-pay | Admitting: Neurology

## 2019-12-13 DIAGNOSIS — G0490 Encephalitis and encephalomyelitis, unspecified: Secondary | ICD-10-CM

## 2019-12-13 DIAGNOSIS — G9340 Encephalopathy, unspecified: Secondary | ICD-10-CM

## 2019-12-13 DIAGNOSIS — R413 Other amnesia: Secondary | ICD-10-CM

## 2019-12-13 DIAGNOSIS — R269 Unspecified abnormalities of gait and mobility: Secondary | ICD-10-CM

## 2019-12-13 DIAGNOSIS — M792 Neuralgia and neuritis, unspecified: Secondary | ICD-10-CM

## 2019-12-13 MED ORDER — MELATONIN 3 MG PO TABS
3.0000 mg | ORAL_TABLET | Freq: Every day | ORAL | Status: DC
  Administered 2019-12-14 – 2019-12-17 (×5): 3 mg via ORAL
  Filled 2019-12-13 (×5): qty 1

## 2019-12-13 NOTE — Plan of Care (Signed)

## 2019-12-13 NOTE — Progress Notes (Signed)
Bladder scan showed 63ml- obtained order for in/out cath- 2 staff members made 2 separate attempts- without success- she then voided large amount- post void bladder scan performed- still showing 369ml left in bladder

## 2019-12-13 NOTE — Progress Notes (Signed)
Progress Note    Katelyn Lamb  [id number] DOB: [birth year]  DOA: 12/06/2019 PCP: [name], MD    Brief Narrative:   Chief complaint: fall/pelvic fracture  Medical records reviewed and are as summarized below:  Katelyn Lamb is an 74 y.o. 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  Work-up in the emergency department revealed oxygen saturation level of 87% on room air, mild tachypnea, left pubic bone/pubic ramus fracture superimposed on old left inferior pubic ramus fractures.  Also with left hand laceration. PT recommended CIR but insurance declined. Family currently in process of appealing.     Assessment/Plan:   Active Problems:   Encephalomyelitis   Neuropathic pain   Neurogenic bladder   Ataxia   Gait abnormality   Chronic pain syndrome   Pelvic fracture (HCC)   AKI (acute kidney injury) ([place])   Hypoxia   Laceration of left hand   DNR (do not resuscitate)   1.  Pelvic fracture secondary to fall.  -Patient with a history of recurrent falls related to sensory ataxia due to history of viral meningeal encephalitis.   -X-ray revealed acute fracture left superior and inferior pubic rami.   -Case discussed with Ortho in the emergency department who recommended weightbearing as tolerated and outpatient follow-up in 4 weeks.   -Evaluated by physical therapy who recommends CIR. Insurance denied-family appealing decision -Pain management improved with scheduled tylenol, robaxin.  -Mobilize as able    2.  Hypoxia/atelectasis.  Resolved.   -Upon presentation patient's oxygen saturation level at room air was 87%.  Chest x-ray revealed some atelectasis without infiltrate.   -Continue incentive  spirometry -Mobilize as able   3.  Acute kidney injury.  Creatinine was 1.18 on admission.  Chart review indicates her baseline is close to 0.8.  Was provided with gentle IV fluids.  Resolved.   4.  Left hand laceration.  Stitches provided in the emergency department.  X-ray showed no fracture or dislocation.  Dressing dry and intact she remains afebrile nontoxic-appearing. -suture removal 1 week -We will asked nurse to redress today   5.  Chronic urinary retention/neurogenic bladder.  History of same.  Also history of recurrent UTIs.  Home medications include Bactrim. -Continue Bactrim   6.  History of HSV and encephalomyelitis.  Stable at baseline. -Continue acyclovir   7.  Chronic pain.  Home medications include Lyrica and Norco.  Stable -voltaren -While on scheduled Tylenol will change to oxycodone   8.  Insomnia.  Home medications include Seroquel.  Stable -Continue home meds   9.  GERD.  Stable -Continue PPI     Family Communication/Anticipated D/C date and plan/Code Status   DVT prophylaxis: heparin ordered. Code Status: dnr Family Communication: granddaughter at bedside 4/9 Disposition Plan: to be determined as CIR denied by insurance and appeal in process/TOC following   Medical Consultants:    Orthopedics  CIR    Subjective:   No overnight events  Objective:    Vitals:   12/12/19 1956 12/13/19 0300 12/13/19 0500 12/13/19 0729  BP: 108/71 100/70  90/60  Pulse: 79 80  79  Resp: 17 16  16   Temp: 98.3 F (36.8 C) 97.9 F (36.6 C)  98.1 F (36.7 C)  TempSrc: Oral Oral  Oral  SpO2: 94% 91%  95%  Weight:   75 kg   Height:        Intake/Output Summary (Last 24 hours) at 12/13/2019 1340 Last data filed at 12/12/2019 1700 Gross per 24 hour  Intake 0 ml  Output --  Net 0 ml   Filed Weights   12/06/19 2100 12/13/19 0500  Weight: 72.4 kg 75 kg    Exam: In bed, pleasant and cooperative    Data Reviewed:   I have personally reviewed  following labs and imaging studies:  Labs: Labs show the following:   Basic Metabolic Panel: Recent Labs  Lab 12/06/19 1605 12/06/19 1605 12/07/19 0355 12/08/19 0524  NA 140  --  140 140  K 3.8   < > 3.9 3.9  CL 101  --  101 103  CO2 29  --  27 28  GLUCOSE 106*  --  100* 93  BUN 11  --  12 13  CREATININE 1.18*  --  1.06* 0.88  CALCIUM 9.0  --  8.4* 8.8*   < > = values in this interval not displayed.   GFR Estimated Creatinine Clearance: 58.6 mL/min (by C-G formula based on SCr of 0.88 mg/dL). Liver Function Tests: Recent Labs  Lab 12/06/19 1605  AST 35  ALT 23  ALKPHOS 121  BILITOT 0.4  PROT 6.6  ALBUMIN 3.4*   No results for input(s): LIPASE, AMYLASE in the last 168 hours. No results for input(s): AMMONIA in the last 168 hours. Coagulation profile No results for input(s): INR, PROTIME in the last 168 hours.  CBC: Recent Labs  Lab 12/06/19 1605 12/07/19 0355 12/08/19 0524  WBC 11.7* 7.8 6.8  HGB 12.1 11.2* 11.5*  HCT 39.3 35.0* 36.3  MCV 98.7 97.5 97.8  PLT 202 182 166   Cardiac Enzymes: No results for input(s): CKTOTAL, CKMB, CKMBINDEX, TROPONINI in the last 168 hours. BNP (last 3 results) No results for input(s): PROBNP in the last 8760 hours. CBG: No results for input(s): GLUCAP in the last 168 hours. D-Dimer: No results for input(s): DDIMER in the last 72 hours. Hgb A1c: No results for input(s): HGBA1C in the last 72 hours. Lipid Profile: No results for input(s): CHOL, HDL, LDLCALC, TRIG, CHOLHDL, LDLDIRECT in the last 72 hours. Thyroid function studies: No results for input(s): TSH, T4TOTAL, T3FREE, THYROIDAB in the last 72 hours.  Invalid input(s): FREET3 Anemia work up: No results for input(s): VITAMINB12, FOLATE, FERRITIN, TIBC, IRON, RETICCTPCT in the last 72 hours. Sepsis Labs: Recent Labs  Lab 12/06/19 1605 12/07/19 0355 12/08/19 0524  WBC 11.7* 7.8 6.8    Microbiology Recent Results (from the past 240 hour(s))  SARS  CORONAVIRUS 2 (TAT 6-24 HRS) Nasopharyngeal Nasopharyngeal Swab     Status: None   Collection Time: 12/06/19  3:59 PM   Specimen: Nasopharyngeal Swab  Result Value Ref Range Status   SARS Coronavirus 2 NEGATIVE NEGATIVE Final    Comment: (NOTE) SARS-CoV-2 target nucleic acids are NOT DETECTED. The SARS-CoV-2 RNA is generally detectable in upper and lower respiratory specimens during the acute phase of infection. Negative results do not preclude SARS-CoV-2 infection, do not rule out co-infections with other pathogens, and should not be used as the sole basis for treatment or other patient management decisions. Negative results must be combined with clinical observations, patient history, and epidemiological information. The expected result is Negative. Fact Sheet for Patients: SugarRoll.be Fact Sheet for Healthcare Providers: https://www.woods-mathews.com/ This test is not yet approved or cleared  by the [place] and  has been authorized for detection and/or diagnosis of SARS-CoV-2 by FDA under an Emergency Use Authorization (EUA). This EUA will remain  in effect (meaning this test can be used) for the duration of the COVID-19 declaration under Section 56 4(b)(1) of the Act, 21 U.S.C. section 360bbb-3(b)(1), unless the authorization is terminated or revoked sooner. Performed at [facility], [place] [address], [place], [place] [address]     Procedures and diagnostic studies:  No results found.  Medications:   . acetaminophen  1,000 mg Oral TID  . acyclovir  400 mg Oral BID  . diazepam  5 mg Oral BID  . diclofenac Sodium  2 g Topical QID  . DULoxetine  30 mg Oral q morning - 10a  . DULoxetine  60 mg Oral QHS  . heparin  5,000 Units Subcutaneous Q8H  . lidocaine-EPINEPHrine  10 mL Infiltration Once  . methocarbamol  500 mg Oral TID  . pantoprazole  40 mg Oral QHS  . polyethylene glycol  17 g Oral Daily  . pregabalin  100  mg Oral QHS  . QUEtiapine  25 mg Oral QHS  . sulfamethoxazole-trimethoprim  0.5 tablet Oral Daily   Continuous Infusions:   LOS: 6 days   Geradine Girt DO [facility]   How to contact the [facility] Attending or Consulting provider [place] or covering provider during after hours [place], for this patient?  1. Check the care team in [facility] and look for a) attending/consulting TRH provider listed and b) the [facility] team listed 2. Log into [email or web address] and use [place]'s universal password to access. If you do not have the password, please contact the hospital operator. 3. Locate the [facility] provider you are looking for under [facility] and page to a number that you can be directly reached. 4. If you still have difficulty reaching the provider, please page the [facility] (Director on Call) for the Hospitalists listed on amion for assistance.  12/13/2019, 1:40 PM

## 2019-12-13 NOTE — Progress Notes (Signed)
Pt bladder scan revealed 541ml retained urine in bladder. RN attempted to in and out cath pt, no urine came out. Per pt request she would like to try to void again and re-try cath after some time if needed. RN will continue to monitor.

## 2019-12-14 ENCOUNTER — Other Ambulatory Visit: Payer: Self-pay

## 2019-12-14 DIAGNOSIS — G0490 Encephalitis and encephalomyelitis, unspecified: Secondary | ICD-10-CM

## 2019-12-14 DIAGNOSIS — M792 Neuralgia and neuritis, unspecified: Secondary | ICD-10-CM

## 2019-12-14 DIAGNOSIS — R413 Other amnesia: Secondary | ICD-10-CM

## 2019-12-14 DIAGNOSIS — G9340 Encephalopathy, unspecified: Secondary | ICD-10-CM

## 2019-12-14 DIAGNOSIS — R269 Unspecified abnormalities of gait and mobility: Secondary | ICD-10-CM

## 2019-12-14 MED ORDER — DULOXETINE HCL 60 MG PO CPEP: 60.0000 mg | ORAL_CAPSULE | Freq: Every day | ORAL | 1 refills | Status: DC

## 2019-12-14 NOTE — Progress Notes (Signed)
NCM spoke with [employer] rep. concerning pt's appeal. Rep informed NCM appeal still under review, determination shold be made today. TOC team will continue monitor and follow... Whitman Hero RN,BSN,CM [phone]

## 2019-12-14 NOTE — Plan of Care (Signed)
  Problem: Pain Managment: Goal: General experience of comfort will improve Outcome: Progressing Note: Treated hip pain once with oxycodone which gave relief   Problem: Safety: Goal: Ability to remain free from injury will improve Outcome: Progressing

## 2019-12-14 NOTE — Progress Notes (Signed)
Physical Therapy Treatment Patient Details Name: Katelyn Lamb MRN: [id number] DOB: June 19, 1946 Today's Date: 12/14/2019    History of Present Illness Katelyn Lamb is a 74 y.o. 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 Imaging: CT head negative for acute intracranial abnormalities, CT cervical spine showed normal alignment of cervical vertebral bodies. X-ray of left hand no fractures or dislocations.    PT Comments    Pt supine in bed on arrival.  She is pleasant and agreeable to PT session this am.  Focused on LE strengthening and gt progression.  She continues to require use of PFRW and min to mod assistance.  She is progressing well and remains an excellent candidate for rehab in a post acute setting.  Continue to recommend CIR.    Follow Up Recommendations  CIR     Equipment Recommendations  Other (comment)    Recommendations for Other Services Rehab consult     Precautions / Restrictions Precautions Precautions: Fall Precaution Comments: spouse reports pt told to avoid L hand composite flexion to allow skin to heal and to maintain suture placement Restrictions Weight Bearing Restrictions: Yes Other Position/Activity Restrictions: WBAT    Mobility  Bed Mobility Overal bed mobility: Needs Assistance Bed Mobility: Supine to Sit     Supine to sit: Mod assist;HOB elevated     General bed mobility comments: Pt presents with posterior lean and required assistance to remains sitting upright.  Assistance to move to edge of bed and to elevate trunk into a seated position.  Transfers Overall transfer level: Needs assistance Equipment used: Left platform  walker Transfers: Sit to/from Stand Sit to Stand: Mod assist         General transfer comment: Mod +1 to rise into standing with cues for hand placement and forward weight shifting.  Moderate assistance to boost into standing.  Ambulation/Gait Ambulation/Gait assistance: Mod assist;+2 safety/equipment;Min assist Gait Distance (Feet): 40 Feet Assistive device: Left platform walker Gait Pattern/deviations: Step-to pattern;Shuffle;Narrow base of support;Trunk flexed Gait velocity: decreased   General Gait Details: Pt with slow progression of R foot forward.  Pt required assistance to weight shift and maintain close position to the RW.  Pt required cues for upper trunk control and maintaining her body position in the center as she tends to drift to teh R side of the [place]   Stairs             Wheelchair Mobility    Modified Rankin (Stroke Patients Only)       Balance Overall balance assessment: Needs assistance Sitting-balance support: Bilateral upper extremity supported Sitting balance-Leahy Scale: Fair       Standing balance-Leahy Scale: Poor                              Cognition Arousal/Alertness: Awake/alert Behavior During Therapy: WFL for tasks assessed/performed Overall Cognitive Status: Impaired/Different from baseline Area of Impairment: Orientation;Attention;Memory;Following commands;Problem solving;Awareness                 Orientation Level: Disoriented to;Time Current Attention Level: Sustained Memory: Decreased short-term memory Following Commands: Follows one step commands consistently;Follows multi-step commands inconsistently   Awareness: Emergent Problem Solving: Slow processing;Difficulty sequencing;Decreased initiation;Requires verbal  cues;Requires tactile cues General Comments: Spouse reports intermittent levels of cognitive deficits stating today seems like a good day.       Exercises General Exercises - Lower  Extremity Ankle Circles/Pumps: AROM;Both;15 reps;Supine Quad Sets: AROM;Both;10 reps;Supine Heel Slides: AROM;Both;10 reps;Supine;AAROM    General Comments        Pertinent Vitals/Pain Pain Assessment: Faces Pain Score: 4  Pain Location: Lt hip, B pelvis Pain Descriptors / Indicators: Aching;Dull;Guarding;Grimacing Pain Intervention(s): Monitored during session;Repositioned    Home Living                      Prior Function            PT Goals (current goals can now be found in the care plan section) Acute Rehab PT Goals Patient Stated Goal: go to rehab and get stronger Potential to Achieve Goals: Fair Progress towards PT goals: Progressing toward goals    Frequency    Min 5X/week      PT Plan Current plan remains appropriate    Co-evaluation              AM-PAC PT "6 Clicks" Mobility   Outcome Measure  Help needed turning from your back to your side while in a flat bed without using bedrails?: A Little Help needed moving from lying on your back to sitting on the side of a flat bed without using bedrails?: A Little Help needed moving to and from a bed to a chair (including a wheelchair)?: A Lot Help needed standing up from a chair using your arms (e.g., wheelchair or bedside chair)?: A Lot Help needed to walk in hospital room?: A Lot Help needed climbing 3-5 steps with a railing? : Total 6 Click Score: 13    End of Session Equipment Utilized During Treatment: Gait belt Activity Tolerance: Patient tolerated treatment well Patient left: in chair;with call bell/phone within reach;with chair alarm set;with family/visitor present;Other (comment) Nurse Communication: Mobility status PT Visit Diagnosis: Unsteadiness on feet (R26.81);Muscle weakness (generalized) (M62.81);History of falling (Z91.81);Difficulty in walking, not elsewhere classified (R26.2);Pain Pain - Right/Left: Right Pain - part of body: Hip     Time: [id number] PT Time Calculation  (min) (ACUTE ONLY): 35 min  Charges:  $Gait Training: 8-22 mins $Therapeutic Exercise: 8-22 mins                     Erasmo Leventhal , PTA Acute Rehabilitation Services Pager [phone] Office [phone]     Jaxzen Vanhorn Eli Hose 12/14/2019, 2:23 PM

## 2019-12-14 NOTE — Progress Notes (Signed)
Progress Note    Katelyn Lamb  [id number] DOB: [birth year]  DOA: 12/06/2019 PCP: [name], MD    Brief Narrative:   Chief complaint: fall/pelvic fracture  Medical records reviewed and are as summarized below:  Katelyn Lamb is an 74 y.o. 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  Work-up in the emergency department revealed oxygen saturation level of 87% on room air, mild tachypnea, left pubic bone/pubic ramus fracture superimposed on old left inferior pubic ramus fractures.  Also with left hand laceration. PT recommended CIR but insurance declined. Family currently in process of appealing.     Assessment/Plan:   Active Problems:   Encephalomyelitis   Neuropathic pain   Neurogenic bladder   Ataxia   Gait abnormality   Chronic pain syndrome   Pelvic fracture (HCC)   AKI (acute kidney injury) ([place])   Hypoxia   Laceration of left hand   DNR (do not resuscitate)   1.  Pelvic fracture secondary to fall.  -Patient with a history of recurrent falls related to sensory ataxia due to history of viral meningeal encephalitis.   -X-ray revealed acute fracture left superior and inferior pubic rami.   -Case discussed with Ortho in the emergency department who recommended weightbearing as tolerated and outpatient follow-up in 4 weeks.   -Evaluated by physical therapy who recommends CIR. Insurance denied-family appealing decision -Pain management improved with scheduled tylenol, robaxin.  -Mobilize as able    2.  Hypoxia/atelectasis.  Resolved.   -Upon presentation patient's oxygen saturation level at room air was 87%.  Chest x-ray revealed some atelectasis without infiltrate.   -Continue incentive  spirometry -Mobilize as able   3.  Acute kidney injury.  Creatinine was 1.18 on admission.  Chart review indicates her baseline is close to 0.8.  Was provided with gentle IV fluids.  Resolved.   4.  Left hand laceration.  Stitches provided in the emergency department.  X-ray showed no fracture or dislocation.  Dressing dry and intact she remains afebrile nontoxic-appearing. -suture removal 1 week -will need sutures removed in next 24-48 hours   5.  Chronic urinary retention/neurogenic bladder.  History of same.  Also history of recurrent UTIs.  Home medications include Bactrim. -Continue Bactrim   6.  History of HSV and encephalomyelitis.  Stable at baseline. -Continue acyclovir   7.  Chronic pain.  Home medications include Lyrica and Norco.  Stable -voltaren -While on scheduled Tylenol will change to oxycodone   8.  Insomnia.  Home medications include Seroquel.  Stable -Continue home meds   9.  GERD.  Stable -Continue PPI     Family Communication/Anticipated D/C date and plan/Code Status   DVT prophylaxis: heparin ordered. Code Status: dnr Family Communication: granddaughter at bedside 4/9 Disposition Plan: to be determined as CIR denied by insurance and appeal in process/TOC following   Medical Consultants:    Orthopedics  CIR    Subjective:   No current complaints  Objective:    Vitals:   12/13/19 2021 12/14/19 0312 12/14/19 0500 12/14/19 0820  BP: 93/70 (!) 86/66  102/69  Pulse: 81 74  75  Resp:    18  Temp: 98 F (36.7 C) 98 F (36.7 C)  (!) 97.5 F (36.4 C)  TempSrc: Oral Oral  Oral  SpO2: 95% 91%  90%  Weight:   73.5 kg   Height:        Intake/Output Summary (Last 24 hours) at 12/14/2019 1440 Last data filed at 12/14/2019 0950 Gross per 24 hour  Intake --  Output 2000 ml  Net -2000 ml   Filed Weights   12/06/19 2100 12/13/19 0500 12/14/19 0500  Weight: 72.4 kg 75 kg 73.5 kg    Exam: In bed, no acute distress    Data Reviewed:   I  have personally reviewed following labs and imaging studies:  Labs: Labs show the following:   Basic Metabolic Panel: Recent Labs  Lab 12/08/19 0524  NA 140  K 3.9  CL 103  CO2 28  GLUCOSE 93  BUN 13  CREATININE 0.88  CALCIUM 8.8*   GFR Estimated Creatinine Clearance: 58 mL/min (by C-G formula based on SCr of 0.88 mg/dL). Liver Function Tests: No results for input(s): AST, ALT, ALKPHOS, BILITOT, PROT, ALBUMIN in the last 168 hours. No results for input(s): LIPASE, AMYLASE in the last 168 hours. No results for input(s): AMMONIA in the last 168 hours. Coagulation profile No results for input(s): INR, PROTIME in the last 168 hours.  CBC: Recent Labs  Lab 12/08/19 0524  WBC 6.8  HGB 11.5*  HCT 36.3  MCV 97.8  PLT 166   Cardiac Enzymes: No results for input(s): CKTOTAL, CKMB, CKMBINDEX, TROPONINI in the last 168 hours. BNP (last 3 results) No results for input(s): PROBNP in the last 8760 hours. CBG: No results for input(s): GLUCAP in the last 168 hours. D-Dimer: No results for input(s): DDIMER in the last 72 hours. Hgb A1c: No results for input(s): HGBA1C in the last 72 hours. Lipid Profile: No results for input(s): CHOL, HDL, LDLCALC, TRIG, CHOLHDL, LDLDIRECT in the last 72 hours. Thyroid function studies: No results for input(s): TSH, T4TOTAL, T3FREE, THYROIDAB in the last 72 hours.  Invalid input(s): FREET3 Anemia work up: No results for input(s): VITAMINB12, FOLATE, FERRITIN, TIBC, IRON, RETICCTPCT in the last 72 hours. Sepsis Labs: Recent Labs  Lab 12/08/19 0524  WBC 6.8    Microbiology Recent Results (from the past 240 hour(s))  SARS CORONAVIRUS 2 (TAT 6-24 HRS) Nasopharyngeal Nasopharyngeal Swab     Status: None   Collection Time: 12/06/19  3:59 PM   Specimen: Nasopharyngeal Swab  Result Value Ref Range Status   SARS Coronavirus 2 NEGATIVE NEGATIVE Final    Comment: (NOTE) SARS-CoV-2 target nucleic acids are NOT DETECTED. The SARS-CoV-2 RNA is  generally detectable in upper and lower respiratory specimens during the acute phase of infection. Negative results do not preclude SARS-CoV-2 infection, do not rule out co-infections with other pathogens, and should not be used as the sole basis for treatment or other patient management decisions. Negative results must be combined with clinical observations, patient history, and epidemiological information. The expected result is Negative. Fact Sheet for Patients: SugarRoll.be Fact Sheet for Healthcare Providers: https://www.woods-mathews.com/ This test is not yet approved or cleared by the [place] FDA and  has been authorized for detection and/or diagnosis of SARS-CoV-2 by FDA under an Emergency Use Authorization (EUA). This EUA will remain  in effect (meaning this test can be used) for the duration of the COVID-19 declaration under Section 56 4(b)(1) of the Act, 21 U.S.C. section 360bbb-3(b)(1), unless the authorization is terminated or revoked sooner. Performed at [facility], [place] [address], [place], [place] [address]     Procedures  and diagnostic studies:  No results found.  Medications:   . acetaminophen  1,000 mg Oral TID  . acyclovir  400 mg Oral BID  . diazepam  5 mg Oral BID  . diclofenac Sodium  2 g Topical QID  . DULoxetine  30 mg Oral q morning - 10a  . DULoxetine  60 mg Oral QHS  . heparin  5,000 Units Subcutaneous Q8H  . lidocaine-EPINEPHrine  10 mL Infiltration Once  . melatonin  3 mg Oral QHS  . methocarbamol  500 mg Oral TID  . pantoprazole  40 mg Oral QHS  . polyethylene glycol  17 g Oral Daily  . pregabalin  100 mg Oral QHS  . QUEtiapine  25 mg Oral QHS  . sulfamethoxazole-trimethoprim  0.5 tablet Oral Daily   Continuous Infusions:   LOS: 7 days   Geradine Girt DO [facility]   How to contact the [facility] Attending or Consulting provider [place] or covering provider during after hours [place], for this patient?  1. Check the care team in [facility] and look for a) attending/consulting TRH provider listed and b) the [facility] team listed 2. Log into [email or web address] and use [place]'s universal password to access. If you do not have the password, please contact the hospital operator. 3. Locate the [facility] provider you are looking for under [facility] and page to a number that you can be directly reached. 4. If you still have difficulty reaching the provider, please page the [facility] (Director on Call) for the Hospitalists listed on amion for assistance.  12/14/2019, 2:40 PM

## 2019-12-14 NOTE — Plan of Care (Signed)
  Problem: Safety: Goal: Ability to remain free from injury will improve Outcome: Progressing   

## 2019-12-14 NOTE — Progress Notes (Signed)
Occupational Therapy Treatment Patient Details Name: Katelyn Lamb MRN: [id number] DOB: August 17, 1946 Today's Date: 12/14/2019    History of present illness Katelyn Lamb is a 74 y.o. 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 Imaging: CT head negative for acute intracranial abnormalities, CT cervical spine showed normal alignment of cervical vertebral bodies. X-ray of left hand no fractures or dislocations.   OT comments  Patient seated in recliner upon entry.  Requires max assist for transfer to [facility] and mod assist from [facility] to EOB, cueing for hand placement and sequencing technique throughout. Engaged in toileting mgmt of clothing (mesh underwear) with max assist and posterior lean without UE support. Poor attention to task, problem solving, safety, and sequencing requiring constant verbal cueing.  Mod assist to return supine.  Continue to recommend CIR at dc.  Will follow acutely.    Follow Up Recommendations  CIR;Supervision/Assistance - 24 hour    Equipment Recommendations  None recommended by OT    Recommendations for Other Services Rehab consult    Precautions / Restrictions Precautions Precautions: Fall Precaution Comments: spouse reports pt told to avoid L hand composite flexion to allow skin to heal and to maintain suture placement Restrictions Weight Bearing Restrictions: Yes Other Position/Activity Restrictions: WBAT       Mobility Bed Mobility Overal bed mobility: Needs Assistance Bed Mobility: Sit to Supine     Supine to sit: Mod assist;HOB elevated Sit to supine: Mod assist   General bed mobility comments: pt requires support for B LEs and guiding support  of trunk to return supine   Transfers Overall transfer level: Needs assistance Equipment used: Left platform walker Transfers: Sit to/from Stand Sit to Stand: Mod assist;Max assist         General transfer comment: using L platform RW from recliner requires max assist to power up and steady with cueing for hand placement and foward weight shifting; from [facility] requires mod assist; cueing for sequencing and safety throughout transfers     Balance Overall balance assessment: Needs assistance Sitting-balance support: Single extremity supported;Feet supported Sitting balance-Leahy Scale: Fair     Standing balance support: Bilateral upper extremity supported;No upper extremity supported;During functional activity Standing balance-Leahy Scale: Poor Standing balance comment: relaint on BUE and external support, posterior lean during ADL tasks in standing                            ADL either performed or assessed with clinical judgement   ADL Overall ADL's : Needs assistance/impaired                         Toilet Transfer: Maximal assistance;Stand-pivot;RW;BSC Toilet Transfer Details (indicate cue type and reason): max assist to power up from recliner, with cueing for hand placement and safety  Toileting- Clothing Manipulation and Hygiene: Maximal assistance;Sit to/from stand Toileting - Clothing Manipulation Details (indicate cue type and reason): max assist to complete clothing mgmt (mesh underwear), with posterior lean with 0 hand support      Functional mobility during ADLs: Moderate assistance;Maximal assistance;Rolling walker;Cueing for safety;Cueing for sequencing General ADL Comments: pt remains limited by decreased cognition, impaired balance and genearlized weakness  Vision       Perception     Praxis      Cognition Arousal/Alertness: Awake/alert Behavior During Therapy: WFL for tasks assessed/performed Overall Cognitive Status:  Impaired/Different from baseline Area of Impairment: Attention;Following commands;Memory;Safety/judgement;Awareness;Problem solving                 Orientation Level: Disoriented to;Time Current Attention Level: Sustained Memory: Decreased recall of precautions;Decreased short-term memory Following Commands: Follows one step commands consistently;Follows one step commands with increased time;Follows multi-step commands inconsistently Safety/Judgement: Decreased awareness of safety;Decreased awareness of deficits Awareness: Emergent Problem Solving: Slow processing;Difficulty sequencing;Decreased initiation;Requires verbal cues;Requires tactile cues General Comments: patient requires 1 step commands, increaesed time for process, verbal cueing for problem solving and demonstrates decreased recall of techniques during transfers         Exercises General Exercises - Lower Extremity Ankle Circles/Pumps: AROM;Both;15 reps;Supine Quad Sets: AROM;Both;10 reps;Supine Heel Slides: AROM;Both;10 reps;Supine;AAROM   Shoulder Instructions       General Comments      Pertinent Vitals/ Pain       Pain Assessment: Faces Pain Score: 4  Faces Pain Scale: Hurts little more Pain Location: Lt hip, B pelvis Pain Descriptors / Indicators: Aching;Dull;Guarding;Grimacing Pain Intervention(s): Monitored during session;Repositioned  Home Living                                          Prior Functioning/Environment              Frequency  Min 2X/week        Progress Toward Goals  OT Goals(current goals can now be found in the care plan section)  Progress towards OT goals: Progressing toward goals  Acute Rehab OT Goals Patient Stated Goal: go to rehab and get stronger OT Goal Formulation: With patient/family  Plan Discharge plan remains appropriate;Frequency remains appropriate    Co-evaluation                 AM-PAC OT "6 Clicks" Daily Activity      Outcome Measure   Help from another person eating meals?: A Little Help from another person taking care of personal grooming?: A Little Help from another person toileting, which includes using toliet, bedpan, or urinal?: A Lot Help from another person bathing (including washing, rinsing, drying)?: A Lot Help from another person to put on and taking off regular upper body clothing?: A Lot Help from another person to put on and taking off regular lower body clothing?: Total 6 Click Score: 13    End of Session Equipment Utilized During Treatment: Gait belt;Other (comment)(L platform RW )  OT Visit Diagnosis: Unsteadiness on feet (R26.81);Cognitive communication deficit (R41.841);Muscle weakness (generalized) (M62.81)   Activity Tolerance Patient tolerated treatment well   Patient Left with call bell/phone within reach;in bed;with bed alarm set   Nurse Communication Mobility status        Time: [id number] OT Time Calculation (min): 27 min  Charges: OT General Charges $OT Visit: 1 Visit OT Treatments $Self Care/Home Management : 23-37 mins  [place] Pager [phone] Office [phone]    Delight Stare 12/14/2019, 3:23 PM

## 2019-12-14 NOTE — Progress Notes (Signed)
Bladder scan showing 54ml- giving her sometime to urinate this morning

## 2019-12-15 LAB — SARS CORONAVIRUS 2 (TAT 6-24 HRS): SARS Coronavirus 2: NEGATIVE

## 2019-12-15 NOTE — Progress Notes (Signed)
NCM made aware by Tammy/ HTA informing NCM appeal for inpatient rehab has been upheld on the 2nd level. NCM made pt/wife aware. Now pt agreeable to SNF level care.  Patient/ husband reports preference for 5 star SNF. NCM provided Medicare SNF ratings list and discussed insurance authorization process. No further questions reported at this time. RNCM to continue to follow and assist with discharge planning needs. Whitman Hero RN,BSN,CM [phone]

## 2019-12-15 NOTE — Progress Notes (Signed)
Physical Therapy Treatment Patient Details Name: Katelyn Lamb MRN: [id number] DOB: 12/25/1945 Today's Date: 12/15/2019    History of Present Illness Katelyn Lamb is a 74 y.o. 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 Imaging: CT head negative for acute intracranial abnormalities, CT cervical spine showed normal alignment of cervical vertebral bodies. X-ray of left hand no fractures or dislocations.    PT Comments    Pt supine in bed on arrival.  She appears to be positioned uncomfortably and was slow to progress mobility to edge of bed.  Pt with posterior bias moving to edge of bed.  Pt required cues for hand placement and assistance to rise into standing.  Pt required assistance to progress gt but remains limited due to pain and fatigue.  Continue to recommend rehab in a post acute setting to improve strength and function before returning home.     Follow Up Recommendations  CIR     Equipment Recommendations  Other (comment)    Recommendations for Other Services Rehab consult     Precautions / Restrictions Precautions Precautions: Fall Precaution Comments: spouse reports pt told to avoid L hand composite flexion to allow skin to heal and to maintain suture placement Restrictions Weight Bearing Restrictions: Yes Other Position/Activity Restrictions: WBAT    Mobility  Bed Mobility Overal bed mobility: Needs Assistance Bed Mobility: Supine to Sit;Sit to Supine     Supine to sit: Mod assist;HOB elevated     General bed mobility comments: Mod assistance to move to edge of bed  Transfers Overall transfer level: Needs assistance Equipment used: Left platform  walker Transfers: Sit to/from Stand Sit to Stand: Mod assist         General transfer comment: Cues for hand placement to and from seated surface.  Pt with posterior bias pre transfer and during translation into standing.  Ambulation/Gait Ambulation/Gait assistance: Mod assist;+2 safety/equipment Gait Distance (Feet): 35 Feet Assistive device: Left platform walker Gait Pattern/deviations: Step-to pattern;Shuffle;Narrow base of support;Trunk flexed     General Gait Details: Pt with slow progression of R foot forward.  Pt required assistance to weight shift and maintain close position to the RW.  Pt required cues for upper trunk control and maintaining her body position in the center as she tends to drift to teh R side of the [place]   Stairs             Wheelchair Mobility    Modified Rankin (Stroke Patients Only)       Balance Overall balance assessment: Needs assistance Sitting-balance support: Single extremity supported;Feet supported Sitting balance-Leahy Scale: Fair       Standing balance-Leahy Scale: Poor                              Cognition Arousal/Alertness: Awake/alert Behavior During Therapy: WFL for tasks assessed/performed Overall Cognitive Status: Impaired/Different from baseline Area of Impairment: Following commands;Safety/judgement;Problem solving                   Current Attention Level: Sustained   Following Commands: Follows one step commands consistently;Follows one step commands with increased time;Follows multi-step commands inconsistently Safety/Judgement: Decreased awareness of safety;Decreased awareness of deficits Awareness: Emergent  Problem Solving: Slow processing;Difficulty sequencing;Requires verbal cues;Requires tactile cues        Exercises      General Comments        Pertinent Vitals/Pain Pain Assessment: Faces Faces Pain Scale: Hurts little more Pain Location: Lt hip, B pelvis Pain Descriptors  / Indicators: Aching;Dull;Guarding;Grimacing Pain Intervention(s): Monitored during session;Repositioned    Home Living                      Prior Function            PT Goals (current goals can now be found in the care plan section) Acute Rehab PT Goals Patient Stated Goal: go to rehab and get stronger Potential to Achieve Goals: Fair Progress towards PT goals: Progressing toward goals    Frequency    Min 5X/week      PT Plan Current plan remains appropriate    Co-evaluation              AM-PAC PT "6 Clicks" Mobility   Outcome Measure  Help needed turning from your back to your side while in a flat bed without using bedrails?: A Little Help needed moving from lying on your back to sitting on the side of a flat bed without using bedrails?: A Little Help needed moving to and from a bed to a chair (including a wheelchair)?: A Lot Help needed standing up from a chair using your arms (e.g., wheelchair or bedside chair)?: A Lot Help needed to walk in hospital room?: A Lot Help needed climbing 3-5 steps with a railing? : Total 6 Click Score: 13    End of Session Equipment Utilized During Treatment: Gait belt Activity Tolerance: Patient tolerated treatment well Patient left: in chair;with call bell/phone within reach;with chair alarm set;with family/visitor present;Other (comment) Nurse Communication: Mobility status PT Visit Diagnosis: Unsteadiness on feet (R26.81);Muscle weakness (generalized) (M62.81);History of falling (Z91.81);Difficulty in walking, not elsewhere classified (R26.2);Pain Pain - Right/Left: Right Pain - part of body: Hip     Time: [id number] PT Time Calculation (min) (ACUTE ONLY): 24 min  Charges:  $Gait Training: 8-22 mins $Therapeutic Activity: 8-22 mins                     Erasmo Leventhal , PTA Acute Rehabilitation Services Pager [phone] Office [phone]     Eythan Jayne Eli Hose 12/15/2019, 4:57 PM

## 2019-12-15 NOTE — Progress Notes (Signed)
Progress Note    DELISHA Lamb  [id number] DOB: June 20, 1946  DOA: 12/06/2019 PCP: [name], MD    Brief Narrative:   Chief complaint: fall/pelvic fracture  Medical records reviewed and are as summarized below:  Katelyn Lamb is an 74 y.o. 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  Work-up in the emergency department revealed oxygen saturation level of 87% on room air, mild tachypnea, left pubic bone/pubic ramus fracture superimposed on old left inferior pubic ramus fractures.  Also with left hand laceration. PT recommended CIR but insurance declined. Family currently in process of appealing.     Assessment/Plan:   Active Problems:   Encephalomyelitis   Neuropathic pain   Neurogenic bladder   Ataxia   Gait abnormality   Chronic pain syndrome   Pelvic fracture (HCC)   AKI (acute kidney injury) ([place])   Hypoxia   Laceration of left hand   DNR (do not resuscitate)   1.  Pelvic fracture secondary to fall.  -Patient with a history of recurrent falls related to sensory ataxia due to history of viral meningeal encephalitis.   -X-ray revealed acute fracture left superior and inferior pubic rami.   -Case discussed with Ortho in the emergency department who recommended weightbearing as tolerated and outpatient follow-up in 4 weeks.   -Evaluated by physical therapy who recommends CIR. Insurance denied-family appealing decision -Pain management improved with scheduled tylenol, robaxin.  -Mobilize as able    2.  Hypoxia/atelectasis.  Resolved.   -Upon presentation patient's oxygen saturation level at room air was 87%.  Chest x-ray revealed some atelectasis without infiltrate.   -Continue incentive  spirometry -Mobilize as able   3.  Acute kidney injury.  Creatinine was 1.18 on admission.  Chart review indicates her baseline is close to 0.8.  Was provided with gentle IV fluids.  Resolved.   4.  Left hand laceration.  Stitches provided in the emergency department.  X-ray showed no fracture or dislocation.  Dressing dry and intact she remains afebrile nontoxic-appearing. -suture removal 1 week -will need sutures removed in next 24-48 hours   5.  Chronic urinary retention/neurogenic bladder.  History of same.  Also history of recurrent UTIs.  Home medications include Bactrim. -Continue Bactrim   6.  History of HSV and encephalomyelitis.  Stable at baseline. -Continue acyclovir   7.  Chronic pain.  Home medications include Lyrica and Norco.  Stable -voltaren -While on scheduled Tylenol will change to oxycodone   8.  Insomnia.  Home medications include Seroquel.  Stable -Continue home meds   9.  GERD.  Stable -Continue PPI     Family Communication/Anticipated D/C date and plan/Code Status   DVT prophylaxis: heparin ordered. Code Status: dnr Disposition Plan: to be determined as CIR denied by insurance and appeal in process/TOC following   Medical Consultants:    Orthopedics  CIR    Subjective:   Patient states she has a good appetite and feels fine today Objective:    Vitals:   12/14/19 0820 12/14/19 1500 12/14/19 2022 12/15/19 0836  BP: 102/69 105/72 95/69 99/61   Pulse: 75 72 81 77  Resp: 18 17 18 18   Temp: (!) 97.5 F (36.4 C) 98.1 F (36.7 C) 97.8 F (36.6 C) 98.4 F (  36.9 C)  TempSrc: Oral Oral Oral Oral  SpO2: 90% 92% 91% 94%  Weight:      Height:        Intake/Output Summary (Last 24 hours) at 12/15/2019 1336 Last data filed at 12/14/2019 1821 Gross per 24 hour  Intake --  Output 500 ml  Net -500 ml   Filed Weights   12/06/19 2100 12/13/19 0500 12/14/19 0500  Weight: 72.4 kg 75 kg 73.5 kg    Exam: In bed, pleasant cooperative    Data  Reviewed:   I have personally reviewed following labs and imaging studies:  Labs: Labs show the following:   Basic Metabolic Panel: No results for input(s): NA, K, CL, CO2, GLUCOSE, BUN, CREATININE, CALCIUM, MG, PHOS in the last 168 hours. GFR Estimated Creatinine Clearance: 58 mL/min (by C-G formula based on SCr of 0.88 mg/dL). Liver Function Tests: No results for input(s): AST, ALT, ALKPHOS, BILITOT, PROT, ALBUMIN in the last 168 hours. No results for input(s): LIPASE, AMYLASE in the last 168 hours. No results for input(s): AMMONIA in the last 168 hours. Coagulation profile No results for input(s): INR, PROTIME in the last 168 hours.  CBC: No results for input(s): WBC, NEUTROABS, HGB, HCT, MCV, PLT in the last 168 hours. Cardiac Enzymes: No results for input(s): CKTOTAL, CKMB, CKMBINDEX, TROPONINI in the last 168 hours. BNP (last 3 results) No results for input(s): PROBNP in the last 8760 hours. CBG: No results for input(s): GLUCAP in the last 168 hours. D-Dimer: No results for input(s): DDIMER in the last 72 hours. Hgb A1c: No results for input(s): HGBA1C in the last 72 hours. Lipid Profile: No results for input(s): CHOL, HDL, LDLCALC, TRIG, CHOLHDL, LDLDIRECT in the last 72 hours. Thyroid function studies: No results for input(s): TSH, T4TOTAL, T3FREE, THYROIDAB in the last 72 hours.  Invalid input(s): FREET3 Anemia work up: No results for input(s): VITAMINB12, FOLATE, FERRITIN, TIBC, IRON, RETICCTPCT in the last 72 hours. Sepsis Labs: No results for input(s): PROCALCITON, WBC, LATICACIDVEN in the last 168 hours.  Microbiology Recent Results (from the past 240 hour(s))  SARS CORONAVIRUS 2 (TAT 6-24 HRS) Nasopharyngeal Nasopharyngeal Swab     Status: None   Collection Time: 12/06/19  3:59 PM   Specimen: Nasopharyngeal Swab  Result Value Ref Range Status   SARS Coronavirus 2 NEGATIVE NEGATIVE Final    Comment: (NOTE) SARS-CoV-2 target nucleic acids are NOT  DETECTED. The SARS-CoV-2 RNA is generally detectable in upper and lower respiratory specimens during the acute phase of infection. Negative results do not preclude SARS-CoV-2 infection, do not rule out co-infections with other pathogens, and should not be used as the sole basis for treatment or other patient management decisions. Negative results must be combined with clinical observations, patient history, and epidemiological information. The expected result is Negative. Fact Sheet for Patients: SugarRoll.be Fact Sheet for Healthcare Providers: https://www.woods-mathews.com/ This test is not yet approved or cleared by the [place] FDA and  has been authorized for detection and/or diagnosis of SARS-CoV-2 by FDA under an Emergency Use Authorization (EUA). This EUA will remain  in effect (meaning this test can be used) for the duration of the COVID-19 declaration under Section 56 4(b)(1) of the Act, 21 U.S.C. section 360bbb-3(b)(1), unless the authorization is terminated or revoked sooner. Performed at [facility], [place] [address], [place], [place] [address]     Procedures and diagnostic studies:  No results found.  Medications:   . acetaminophen  1,000 mg Oral TID  .  acyclovir  400 mg Oral BID  . diazepam  5 mg Oral BID  . diclofenac Sodium  2 g Topical QID  . DULoxetine  30 mg Oral q morning - 10a  . DULoxetine  60 mg Oral QHS  . heparin  5,000 Units Subcutaneous Q8H  . lidocaine-EPINEPHrine  10 mL Infiltration Once  . melatonin  3 mg Oral QHS  . pantoprazole  40 mg Oral QHS  . polyethylene glycol  17 g Oral Daily  . pregabalin  100 mg Oral QHS  . QUEtiapine  25 mg Oral QHS  . sulfamethoxazole-trimethoprim  0.5 tablet Oral Daily   Continuous Infusions:   LOS: 8 days   Geradine Girt DO [facility]   How to contact the [facility] Attending or Consulting provider [place] or covering provider during after hours [place], for this patient?  1. Check the care team in [facility] and look for a) attending/consulting [facility] provider listed and b) the [facility] team listed 2. Log into [email or web address] and use 's universal password to access. If you do not have the password, please contact the hospital operator. 3. Locate the [facility] provider you are looking for under [facility] and page to a number that you can be directly reached. 4. If you still have difficulty reaching the provider, please page the [facility] (Director on Call) for the Hospitalists listed on amion for assistance.  12/15/2019, 1:36 PM

## 2019-12-15 NOTE — Plan of Care (Signed)
  Problem: Clinical Measurements: Goal: Will remain free from infection Outcome: Progressing   Problem: Elimination: Goal: Will not experience complications related to bowel motility Outcome: Progressing   Problem: Safety: Goal: Ability to remain free from injury will improve Outcome: Progressing

## 2019-12-16 DIAGNOSIS — S61412A Laceration without foreign body of left hand, initial encounter: Secondary | ICD-10-CM

## 2019-12-16 NOTE — Progress Notes (Signed)
Physical Therapy Treatment Patient Details Name: SARAI SIERACKI MRN: [id number] DOB: [birth year] Today's Date: 12/16/2019    History of Present Illness RAIZA FOELL is a 74 y.o. 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 Imaging: CT head negative for acute intracranial abnormalities, CT cervical spine showed normal alignment of cervical vertebral bodies. X-ray of left hand no fractures or dislocations.    PT Comments    Pt supine in bed on arrival.  She is slid to the bottom of the bed and reports soreness in L groin and buttock.  Pt required assistance to roll and reposition to improve comfort.  Focused on LE strengthening and gentle ROM. Stretching.  Placed geomat under patient's bottom to off load her weight to improve comfort. Continue to recommend rehab in a post acute setting before returning home.    Follow Up Recommendations  CIR     Equipment Recommendations  Other (comment)    Recommendations for Other Services Rehab consult     Precautions / Restrictions Precautions Precautions: Fall Precaution Comments: spouse reports pt told to avoid L hand composite flexion to allow skin to heal and to maintain suture placement Restrictions Weight Bearing Restrictions: Yes    Mobility  Bed Mobility Overal bed mobility: Needs Assistance Bed Mobility: Rolling Rolling: Min assist         General bed mobility comments: rolling assistance to place geo mat under patient.  Pt reports increased pain in L groin through her back side.  Transfers Overall transfer level: (deferred transfers due to pain.)                  Ambulation/Gait                  Stairs             Wheelchair Mobility    Modified Rankin (Stroke Patients Only)       Balance                                            Cognition Arousal/Alertness: Awake/alert Behavior During Therapy: WFL for tasks assessed/performed                                          Exercises General Exercises - Lower Extremity Ankle Circles/Pumps: AROM;Both;20 reps;Supine Quad Sets: AROM;Both;10 reps;Supine Heel Slides: AROM;Both;10 reps;Supine Other Exercises Other Exercises: heel cord HS stretch ( gentle ) on L hip.    General Comments        Pertinent Vitals/Pain Pain Assessment: Faces Faces Pain Scale: Hurts little more Pain Location: Lt hip, B pelvis Pain Descriptors / Indicators: Aching;Dull;Guarding;Grimacing Pain Intervention(s): Monitored during session;Repositioned    Home Living                      Prior Function            PT Goals (current goals can now be found in the care plan section) Acute Rehab PT Goals Patient Stated Goal: go to rehab and  get stronger Potential to Achieve Goals: Fair Progress towards PT goals: Progressing toward goals    Frequency    Min 5X/week      PT Plan Current plan remains appropriate    Co-evaluation              AM-PAC PT "6 Clicks" Mobility   Outcome Measure  Help needed turning from your back to your side while in a flat bed without using bedrails?: A Little Help needed moving from lying on your back to sitting on the side of a flat bed without using bedrails?: A Little Help needed moving to and from a bed to a chair (including a wheelchair)?: A Lot Help needed standing up from a chair using your arms (e.g., wheelchair or bedside chair)?: A Lot Help needed to walk in hospital room?: A Lot Help needed climbing 3-5 steps with a railing? : Total 6 Click Score: 13    End of Session Equipment Utilized During Treatment: Gait belt Activity  Tolerance: Patient tolerated treatment well Patient left: with call bell/phone within reach;in bed;with bed alarm set;with family/visitor present Nurse Communication: Mobility status(informed nursing of geo mat placement to offload pressure.) PT Visit Diagnosis: Unsteadiness on feet (R26.81);Muscle weakness (generalized) (M62.81);History of falling (Z91.81);Difficulty in walking, not elsewhere classified (R26.2);Pain Pain - Right/Left: Right Pain - part of body: Hip     Time: [id number] PT Time Calculation (min) (ACUTE ONLY): 24 min  Charges:  $Therapeutic Exercise: 8-22 mins $Therapeutic Activity: 8-22 mins                     Erasmo Leventhal , PTA Acute Rehabilitation Services Pager [phone] Office [phone]     [place]ley [place] Eli Hose 12/16/2019, 5:22 PM

## 2019-12-16 NOTE — Progress Notes (Signed)
NCM received call from  [place] [phone])  with HTA/UR. Stephanie informed NCM OF  SNF authorization, # [id number] x 5 days, ambulance transfer authorization [phone].  Whitman Hero RN,BSN,CM

## 2019-12-16 NOTE — Progress Notes (Addendum)
PROGRESS NOTE    Katelyn Lamb  [id number] DOB: 02-21-1946 DOA: 12/06/2019 PCP: [name], MD    Brief Narrative:  Katelyn Lamb is a 74 y.o. 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   Work-up in the emergency department revealedoxygen saturation level of 87% on room air, mild tachypnea, left pubic bone/pubic ramus fracture superimposed on old left inferior pubic ramus fractures. Also with left hand laceration. PT recommended CIR but insurance declined initially and declined once again family appealed.  Currently awaiting SNF placement.   Assessment & Plan:   Active Problems:   Encephalomyelitis   Neuropathic pain   Neurogenic bladder   Ataxia   Gait abnormality   Chronic pain syndrome   Pelvic fracture (HCC)   AKI (acute kidney injury) ([place])   Hypoxia   Laceration of left hand   DNR (do not resuscitate)   Pelvic fracture secondary to fall.  Patient with a history of recurrent falls related to sensory ataxia due to history of viral meningeal encephalitis. X-ray revealedacute fracture left superior and inferior pubic rami. Case discussed with [facility], Dr. Alvan Dame in the emergency department who recommended weightbearing as tolerated and outpatient follow-up in 4 weeks.  --PT recommends CIR.Insurance denied, and denied once again on appeal --Pain control with oxycodone 2.5-5mg  every 6 hours as needed --Mobilize as able --Pending SNF placement  Hypoxia/atelectasis.Resolved. Upon presentation patient's oxygen saturation level at room air was 87%. Chest x-ray revealed some atelectasis without infiltrate.  --Continue incentive spirometry --Mobilize as able  Acute kidney  injury: Resolved . Creatinine was 1.18 on admission; baseline is close to 0.8. Resolved with IV fluid hydration.   Left hand laceration. Reapproximated with sutures in the ED.  X-ray with no fracture/dislocation.  Not well approximated over the first left proximal metacarpal phalangeal joint sutures removed on 12/16/2019.  Chronic urinary retention/neurogenic bladder.  --Continue home Bactrim for suppressive therapy  History of HSV and encephalomyelitis.  Stable at baseline. --Continue acyclovir 400 mg p.o. twice daily  Chronic pain.  Home medications include Lyrica and Norco.Stable --voltaren 2g topically QID --Continue oxycodone as above, was getting scheduled Tylenol previously.  Insomnia.  --Continue home Seroquel 25mg  PO qHS --Melatonin 3 mg PO qHS  GERD.  --Continue PPI   DVT prophylaxis: Heparin Code Status: DNR Family Communication: Husband present at bedside  Status is: Inpatient  Remains inpatient appropriate because:Ongoing active pain requiring inpatient pain management, Unsafe d/c plan and Inpatient level of care appropriate due to severity of illness   Dispo: The patient is from: Home              Anticipated d/c is to: SNF              Anticipated d/c date is: 1 day              Patient currently is medically stable to d/c.   Consultants:   Orthopedics, Dr Alvan Dame; case was discussed by ED provider on admission  Procedures:   None  Antimicrobials:   Home prophylactic suppressive Bactrim   Subjective: Patient seen and examined at bedside, resting comfortably in bedside chair.  Husband present.  Pain better controlled today.  Ready to have her sutures removed today.  No other specific complaints or concerns from  patient and her husband.  Awaiting SNF placement, as insurance has declined CIR placement.  Patient denies headache, no fever/chills/night sweats, no chest pain, no shortness of breath, no abdominal pain.  No acute events  overnight per nursing staff.  Objective: Vitals:   12/15/19 1735 12/15/19 2041 12/16/19 0436 12/16/19 0803  BP: 106/78 97/72 110/67 99/62  Pulse: 84 85 75 74  Resp: 17 18 18 17   Temp: 98.4 F (36.9 C) 98.4 F (36.9 C) 98.6 F (37 C) 98.1 F (36.7 C)  TempSrc: Oral Oral  Oral  SpO2: 94% 95% 96% 94%  Weight:      Height:        Intake/Output Summary (Last 24 hours) at 12/16/2019 1257 Last data filed at 12/16/2019 1000 Gross per 24 hour  Intake 720 ml  Output 800 ml  Net -80 ml   Filed Weights   12/06/19 2100 12/13/19 0500 12/14/19 0500  Weight: 72.4 kg 75 kg 73.5 kg    Examination:  General exam: Appears calm and comfortable  Respiratory system: Clear to auscultation. Respiratory effort normal.  Actually well on room air. Cardiovascular system: S1 & S2 heard, RRR. No JVD, murmurs, rubs, gallops or clicks. No pedal edema. Gastrointestinal system: Abdomen is nondistended, soft and nontender. No organomegaly or masses felt. Normal bowel sounds heard. Central nervous system: Alert and oriented. No focal neurological deficits. Extremities: Symmetric 5 x 5 power. Skin: Noted left hand wound with sutures/Steri-Strips in place; not well approximated over left first proximal metacarpal phalangeal joint Psychiatry: Judgement and insight appear normal. Mood & affect appropriate.     Data Reviewed: I have personally reviewed following labs and imaging studies  CBC: No results for input(s): WBC, NEUTROABS, HGB, HCT, MCV, PLT in the last 168 hours. Basic Metabolic Panel: No results for input(s): NA, K, CL, CO2, GLUCOSE, BUN, CREATININE, CALCIUM, MG, PHOS in the last 168 hours. GFR: Estimated Creatinine Clearance: 58 mL/min (by C-G formula based on SCr of 0.88 mg/dL). Liver Function Tests: No results for input(s): AST, ALT, ALKPHOS, BILITOT, PROT, ALBUMIN in the last 168 hours. No results for input(s): LIPASE, AMYLASE in the last 168 hours. No results for input(s): AMMONIA in the  last 168 hours. Coagulation Profile: No results for input(s): INR, PROTIME in the last 168 hours. Cardiac Enzymes: No results for input(s): CKTOTAL, CKMB, CKMBINDEX, TROPONINI in the last 168 hours. BNP (last 3 results) No results for input(s): PROBNP in the last 8760 hours. HbA1C: No results for input(s): HGBA1C in the last 72 hours. CBG: No results for input(s): GLUCAP in the last 168 hours. Lipid Profile: No results for input(s): CHOL, HDL, LDLCALC, TRIG, CHOLHDL, LDLDIRECT in the last 72 hours. Thyroid Function Tests: No results for input(s): TSH, T4TOTAL, FREET4, T3FREE, THYROIDAB in the last 72 hours. Anemia Panel: No results for input(s): VITAMINB12, FOLATE, FERRITIN, TIBC, IRON, RETICCTPCT in the last 72 hours. Sepsis Labs: No results for input(s): PROCALCITON, LATICACIDVEN in the last 168 hours.  Recent Results (from the past 240 hour(s))  SARS CORONAVIRUS 2 (TAT 6-24 HRS) Nasopharyngeal Nasopharyngeal Swab     Status: None   Collection Time: 12/06/19  3:59 PM   Specimen: Nasopharyngeal Swab  Result Value Ref Range Status   SARS Coronavirus 2 NEGATIVE NEGATIVE Final    Comment: (NOTE) SARS-CoV-2 target nucleic acids are NOT DETECTED. The SARS-CoV-2 RNA is generally detectable in upper and lower respiratory specimens during the acute phase of infection. Negative results do not preclude SARS-CoV-2 infection, do not rule out  co-infections with other pathogens, and should not be used as the sole basis for treatment or other patient management decisions. Negative results must be combined with clinical observations, patient history, and epidemiological information. The expected result is Negative. Fact Sheet for Patients: SugarRoll.be Fact Sheet for Healthcare Providers: https://www.woods-mathews.com/ This test is not yet approved or cleared by the [place] FDA and  has been authorized for detection and/or diagnosis of  SARS-CoV-2 by FDA under an Emergency Use Authorization (EUA). This EUA will remain  in effect (meaning this test can be used) for the duration of the COVID-19 declaration under Section 56 4(b)(1) of the Act, 21 U.S.C. section 360bbb-3(b)(1), unless the authorization is terminated or revoked sooner. Performed at [facility], [place] [address], [place], [name] [address]   SARS CORONAVIRUS 2 (TAT 6-24 HRS) Nasopharyngeal Nasopharyngeal Swab     Status: None   Collection Time: 12/15/19  5:46 PM   Specimen: Nasopharyngeal Swab  Result Value Ref Range Status   SARS Coronavirus 2 NEGATIVE NEGATIVE Final    Comment: (NOTE) SARS-CoV-2 target nucleic acids are NOT DETECTED. The SARS-CoV-2 RNA is generally detectable in upper and lower respiratory specimens during the acute phase of infection. Negative results do not preclude SARS-CoV-2 infection, do not rule out co-infections with other pathogens, and should not be used as the sole basis for treatment or other patient management decisions. Negative results must be combined with clinical observations, patient history, and epidemiological information. The expected result is Negative. Fact Sheet for Patients: SugarRoll.be Fact Sheet for Healthcare Providers: https://www.woods-mathews.com/ This test is not yet approved or cleared by the [place] FDA and  has been authorized for detection and/or diagnosis of SARS-CoV-2 by FDA under an Emergency Use Authorization (EUA). This EUA will remain  in effect (meaning this test can be used) for the duration of the COVID-19 declaration under Section 56 4(b)(1) of the Act, 21 U.S.C. section 360bbb-3(b)(1), unless the authorization is terminated or revoked sooner. Performed at [facility], [place] [address], [place], [place] [address]          Radiology Studies: No results found.      Scheduled Meds: . acyclovir  400 mg Oral BID  .  diazepam  5 mg Oral BID  . diclofenac Sodium  2 g Topical QID  . DULoxetine  30 mg Oral q morning - 10a  . DULoxetine  60 mg Oral QHS  . heparin  5,000 Units Subcutaneous Q8H  . lidocaine-EPINEPHrine  10 mL Infiltration Once  . melatonin  3 mg Oral QHS  . pantoprazole  40 mg Oral QHS  . polyethylene glycol  17 g Oral Daily  . pregabalin  100 mg Oral QHS  . QUEtiapine  25 mg Oral QHS  . sulfamethoxazole-trimethoprim  0.5 tablet Oral Daily   Continuous Infusions:   LOS: 9 days    Time spent: 40 minutes spent on chart review, discussion with nursing staff, consultants, updating family and interview/physical exam; more than 50% of that time was spent in counseling and/or coordination of care.    Eric J [place] DO [facility] Available via Epic secure chat 7am-7pm After these hours, please refer to coverage provider listed on [email or web address] 12/16/2019, 12:57 PM

## 2019-12-16 NOTE — Plan of Care (Signed)
  Problem: Elimination: Goal: Will not experience complications related to bowel motility Outcome: Progressing Note: BM this shift   Problem: Pain Managment: Goal: General experience of comfort will improve Outcome: Progressing   Problem: Safety: Goal: Ability to remain free from injury will improve Outcome: Progressing

## 2019-12-16 NOTE — TOC Progression Note (Signed)
Transition of Care [facility]) - Progression Note    Patient Details  Name: Katelyn Lamb MRN: [id number] Date of Birth: 27-Oct-1945  [facility]) CM/SW Contact  Sharin Mons, RN Phone Number: [phone] 12/16/2019, 12:09 PM  Clinical Narrative:    Awaiting SNF bed offers. Pt's preference [facility].... NCM has faxed referral and left voice message with admission liaison Prem to review. TOC  Team monitoring for needs.   Expected Discharge Plan: Skilled Nursing Facility Barriers to Discharge: No SNF bed  Expected Discharge Plan and Services Expected Discharge Plan: [place]      Social Determinants of Health (SDOH) Interventions    Readmission Risk Interventions No flowsheet data found.

## 2019-12-17 ENCOUNTER — Other Ambulatory Visit: Payer: Self-pay | Admitting: Neurology

## 2019-12-17 LAB — SARS CORONAVIRUS 2 (TAT 6-24 HRS): SARS Coronavirus 2: NEGATIVE

## 2019-12-17 MED ORDER — DULOXETINE HCL 30 MG PO CPEP: 30.0000 mg | ORAL_CAPSULE | Freq: Every morning | ORAL | 0 refills | Status: DC

## 2019-12-17 MED ORDER — DIAZEPAM 5 MG PO TABS: 5.0000 mg | ORAL_TABLET | Freq: Two times a day (BID) | ORAL | 0 refills | Status: AC

## 2019-12-17 MED ORDER — DULOXETINE HCL 60 MG PO CPEP: 60.0000 mg | ORAL_CAPSULE | Freq: Every day | ORAL | 0 refills | Status: DC

## 2019-12-17 MED ORDER — OXYCODONE HCL 5 MG PO TABS: 5.0000 mg | ORAL_TABLET | Freq: Four times a day (QID) | ORAL | 0 refills | Status: AC | PRN

## 2019-12-17 NOTE — Progress Notes (Signed)
PROGRESS NOTE    Katelyn Lamb  [id number] DOB: 1946-08-02 DOA: 12/06/2019 PCP: [name], MD    Brief Narrative:  Katelyn Lamb is a 74 y.o. 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   Work-up in the emergency department revealedoxygen saturation level of 87% on room air, mild tachypnea, left pubic bone/pubic ramus fracture superimposed on old left inferior pubic ramus fractures. Also with left hand laceration. PT recommended CIR but insurance declined initially and declined once again family appealed.  Currently awaiting SNF placement.   Assessment & Plan:   Active Problems:   Encephalomyelitis   Neuropathic pain   Neurogenic bladder   Ataxia   Gait abnormality   Chronic pain syndrome   Pelvic fracture (HCC)   AKI (acute kidney injury) ([place])   Hypoxia   Laceration of left hand   DNR (do not resuscitate)   Pelvic fracture secondary to fall.  Patient with a history of recurrent falls related to sensory ataxia due to history of viral meningeal encephalitis. X-ray revealedacute fracture left superior and inferior pubic rami. Case discussed with [facility], Dr. Alvan Dame in the emergency department who recommended weightbearing as tolerated and outpatient follow-up in 4 weeks.  --PT recommends CIR.Insurance denied, and denied once again on appeal --Pain control with oxycodone 2.5-5mg  every 6 hours as needed --Mobilize as able --Pending SNF placement  Hypoxia/atelectasis.Resolved. Upon presentation patient's oxygen saturation level at room air was 87%. Chest x-ray revealed some atelectasis without infiltrate.  --Continue incentive spirometry, oxygenating well on room air --Mobilize  as able  Acute kidney injury: Resolved . Creatinine was 1.18 on admission; baseline is close to 0.8. Resolved with IV fluid hydration.   Left hand laceration. Reapproximated with sutures in the ED.  X-ray with no fracture/dislocation.  Not well approximated over the first left proximal metacarpal phalangeal joint sutures removed on 12/16/2019.  Chronic urinary retention/neurogenic bladder.  --Continue home Bactrim for suppressive therapy  History of HSV and encephalomyelitis.  Stable at baseline. --Continue acyclovir 400 mg p.o. twice daily  Chronic pain.  Home medications include Lyrica and Norco.Stable --voltaren 2g topically QID --Continue oxycodone as above, was getting scheduled Tylenol previously.  Insomnia.  --Continue home Seroquel 25mg  PO qHS --Melatonin 3 mg PO qHS  GERD.  --Continue PPI   DVT prophylaxis: Heparin Code Status: DNR Family Communication: No family present at bedside this morning, husband updated extensively yesterday  Status is: Inpatient  Remains inpatient appropriate because:Ongoing active pain requiring inpatient pain management, Unsafe d/c plan and Inpatient level of care appropriate due to severity of illness   Dispo: The patient is from: Home              Anticipated d/c is to: SNF              Anticipated d/c date is: 1 day              Patient currently is medically stable to d/c.   Consultants:   Orthopedics, Dr Alvan Dame; case was discussed by ED provider on admission  Procedures:   None  Antimicrobials:   Home prophylactic suppressive Bactrim   Subjective: Patient seen and examined at bedside, sleeping but easily arousable.  Patient states had good sleep overnight, overall feels well this morning with  good pain control.  No other complaints or concerns at this time.  Awaiting SNF placement. Patient denies headache, no fever/chills/night sweats, no chest pain, no shortness of breath, no abdominal pain.  No acute  events overnight per nursing staff.  Objective: Vitals:   12/16/19 0436 12/16/19 0803 12/16/19 2211 12/17/19 0330  BP: 110/67 99/62 (!) 91/57 97/67  Pulse: 75 74 88 84  Resp: 18 17 16 14   Temp: 98.6 F (37 C) 98.1 F (36.7 C) 98.7 F (37.1 C) 98.3 F (36.8 C)  TempSrc:  Oral Oral Oral  SpO2: 96% 94% 96% 95%  Weight:      Height:        Intake/Output Summary (Last 24 hours) at 12/17/2019 1159 Last data filed at 12/16/2019 1540 Gross per 24 hour  Intake 150 ml  Output 500 ml  Net -350 ml   Filed Weights   12/06/19 2100 12/13/19 0500 12/14/19 0500  Weight: 72.4 kg 75 kg 73.5 kg    Examination:  General exam: Appears calm and comfortable  Respiratory system: Clear to auscultation. Respiratory effort normal.  Oxygenating well on room air. Cardiovascular system: S1 & S2 heard, RRR. No JVD, murmurs, rubs, gallops or clicks. No pedal edema. Gastrointestinal system: Abdomen is nondistended, soft and nontender. No organomegaly or masses felt. Normal bowel sounds heard. Central nervous system: Alert and oriented. No focal neurological deficits. Extremities: Symmetric 5 x 5 power. Skin: Noted left hand wound with Steri-Strips in place; not well approximated over left first proximal metacarpal phalangeal joint Psychiatry: Judgement and insight appear normal. Mood & affect appropriate.     Data Reviewed: I have personally reviewed following labs and imaging studies  CBC: No results for input(s): WBC, NEUTROABS, HGB, HCT, MCV, PLT in the last 168 hours. Basic Metabolic Panel: No results for input(s): NA, K, CL, CO2, GLUCOSE, BUN, CREATININE, CALCIUM, MG, PHOS in the last 168 hours. GFR: Estimated Creatinine Clearance: 58 mL/min (by C-G formula based on SCr of 0.88 mg/dL). Liver Function Tests: No results for input(s): AST, ALT, ALKPHOS, BILITOT, PROT, ALBUMIN in the last 168 hours. No results for input(s): LIPASE, AMYLASE in the last 168 hours. No results for input(s): AMMONIA  in the last 168 hours. Coagulation Profile: No results for input(s): INR, PROTIME in the last 168 hours. Cardiac Enzymes: No results for input(s): CKTOTAL, CKMB, CKMBINDEX, TROPONINI in the last 168 hours. BNP (last 3 results) No results for input(s): PROBNP in the last 8760 hours. HbA1C: No results for input(s): HGBA1C in the last 72 hours. CBG: No results for input(s): GLUCAP in the last 168 hours. Lipid Profile: No results for input(s): CHOL, HDL, LDLCALC, TRIG, CHOLHDL, LDLDIRECT in the last 72 hours. Thyroid Function Tests: No results for input(s): TSH, T4TOTAL, FREET4, T3FREE, THYROIDAB in the last 72 hours. Anemia Panel: No results for input(s): VITAMINB12, FOLATE, FERRITIN, TIBC, IRON, RETICCTPCT in the last 72 hours. Sepsis Labs: No results for input(s): PROCALCITON, LATICACIDVEN in the last 168 hours.  Recent Results (from the past 240 hour(s))  SARS CORONAVIRUS 2 (TAT 6-24 HRS) Nasopharyngeal Nasopharyngeal Swab     Status: None   Collection Time: 12/15/19  5:46 PM   Specimen: Nasopharyngeal Swab  Result Value Ref Range Status   SARS Coronavirus 2 NEGATIVE NEGATIVE Final    Comment: (NOTE) SARS-CoV-2 target nucleic acids are NOT DETECTED. The SARS-CoV-2 RNA is generally detectable in upper and lower respiratory specimens during the acute phase of infection. Negative results do not preclude SARS-CoV-2 infection, do not  rule out co-infections with other pathogens, and should not be used as the sole basis for treatment or other patient management decisions. Negative results must be combined with clinical observations, patient history, and epidemiological information. The expected result is Negative. Fact Sheet for Patients: SugarRoll.be Fact Sheet for Healthcare Providers: https://www.woods-mathews.com/ This test is not yet approved or cleared by the [place] FDA and  has been authorized for detection and/or diagnosis of  SARS-CoV-2 by FDA under an Emergency Use Authorization (EUA). This EUA will remain  in effect (meaning this test can be used) for the duration of the COVID-19 declaration under Section 56 4(b)(1) of the Act, 21 U.S.C. section 360bbb-3(b)(1), unless the authorization is terminated or revoked sooner. Performed at [facility], [place] [address], [place],  [address]          Radiology Studies: No results found.      Scheduled Meds: . acyclovir  400 mg Oral BID  . diazepam  5 mg Oral BID  . diclofenac Sodium  2 g Topical QID  . DULoxetine  30 mg Oral q morning - 10a  . DULoxetine  60 mg Oral QHS  . heparin  5,000 Units Subcutaneous Q8H  . lidocaine-EPINEPHrine  10 mL Infiltration Once  . melatonin  3 mg Oral QHS  . pantoprazole  40 mg Oral QHS  . polyethylene glycol  17 g Oral Daily  . pregabalin  100 mg Oral QHS  . QUEtiapine  25 mg Oral QHS  . sulfamethoxazole-trimethoprim  0.5 tablet Oral Daily   Continuous Infusions:   LOS: 10 days    Time spent: 35 minutes spent on chart review, discussion with nursing staff, consultants, updating family and interview/physical exam; more than 50% of that time was spent in counseling and/or coordination of care.    Kalyiah Saintil J [place] DO [facility] Available via Epic secure chat 7am-7pm After these hours, please refer to coverage provider listed on [email or web address] 12/17/2019, 11:59 AM

## 2019-12-17 NOTE — Progress Notes (Signed)
NCM shared  SNF bed offers with pt and pt's husband this am. Husband stated he was interested in [facility]. NCM allowed pt to speak with each admission's liaison to ask questions.  Husband left hospital to view [facility] and accepted their bed offer. However after speaking with family husband is now requesting for pt to go to [facility]. NCM made [facility] aware and bed offered extended and accepted. [facility] liaison stated they will be able to receive pt in the am. NCM made MD, pt and nurse aware.  TOC team will continue to monitor and follow. Whitman Hero RN,BSN,CM [phone]

## 2019-12-17 NOTE — Progress Notes (Signed)
Physical Therapy Treatment Patient Details Name: CAMYA CERUTTI MRN: [id number] DOB: 30-Apr-1946 Today's Date: 12/17/2019    History of Present Illness PAIZLEE BELLINGHAUSEN is a 74 y.o. 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 Imaging: CT head negative for acute intracranial abnormalities, CT cervical spine showed normal alignment of cervical vertebral bodies. X-ray of left hand no fractures or dislocations.    PT Comments    Pt supine in bed.  She is aggreeable to PT session this evening.  Pt required increased assistance this session with gt as she present with poor balance, increased pain and decreased strength.  Continue to recommend rehab in a post acute setting to improve strength and function before going home.    Follow Up Recommendations  CIR     Equipment Recommendations  Other (comment)    Recommendations for Other Services Rehab consult     Precautions / Restrictions Precautions Precautions: Fall Precaution Comments: spouse reports pt told to avoid L hand composite flexion to allow skin to heal and to maintain suture placement Restrictions Weight Bearing Restrictions: Yes Other Position/Activity Restrictions: WBAT    Mobility  Bed Mobility Overal bed mobility: Needs Assistance Bed Mobility: Rolling Rolling: Mod assist   Supine to sit: Mod assist;HOB elevated     General bed mobility comments: Mod to roll and advance LEs to edge bed.  Mod assistance to rise into sitting.   pt presents with posterior bias.  Transfers Overall transfer level: Needs assistance Equipment used: Left platform walker Transfers: Sit to/from Stand Sit to Stand: Mod assist          General transfer comment: Cues for hand placement to and from seated surface.  Pt with posterior bias pre transfer and during translation into standing.  Post gt started to sit prematurely and require chair to be brought up behind her.  Ambulation/Gait Ambulation/Gait assistance: Mod assist;+2 safety/equipment;Max assist Gait Distance (Feet): 20 Feet Assistive device: Left platform walker Gait Pattern/deviations: Step-to pattern;Shuffle;Narrow base of support;Trunk flexed Gait velocity: decreased   General Gait Details: Pt with slow progression of R foot forward.  Pt required assistance to weight shift and maintain close position to the RW.  Pt required cues for upper trunk control and maintaining her body position in the center as she tends to drift to the R side of the PFRW.   Pt with poor balance as she fatigued multiple LOB and required max assistance at the end of gt training.   Stairs             Wheelchair Mobility    Modified Rankin (Stroke Patients Only)       Balance Overall balance assessment: Needs assistance   Sitting balance-Leahy Scale: Fair     Standing balance support: Bilateral upper extremity supported;No upper extremity supported;During functional activity Standing balance-Leahy Scale: Poor                              Cognition Arousal/Alertness: Awake/alert Behavior During Therapy: WFL for tasks assessed/performed Overall Cognitive Status: Impaired/Different from baseline Area of Impairment: Following commands;Safety/judgement;Problem solving                   Current Attention Level: Sustained  Memory: Decreased recall of precautions;Decreased short-term memory Following Commands: Follows one step commands consistently;Follows one step commands with increased time;Follows multi-step commands inconsistently Safety/Judgement: Decreased awareness of safety;Decreased awareness of deficits Awareness: Emergent Problem Solving:  Slow processing;Difficulty sequencing;Requires verbal cues;Requires tactile cues General Comments: patient requires 1 step commands, increased time for process, verbal cueing for problem solving and demonstrates decreased recall of techniques during transfers      Exercises      General Comments        Pertinent Vitals/Pain Pain Assessment: Faces Faces Pain Scale: Hurts whole lot Pain Location: Lt hip, B pelvis Pain Descriptors / Indicators: Aching;Dull;Guarding;Grimacing Pain Intervention(s): Monitored during session;Repositioned    Home Living                      Prior Function            PT Goals (current goals can now be found in the care plan section) Acute Rehab PT Goals Patient Stated Goal: go to rehab and get stronger Potential to Achieve Goals: Fair Progress towards PT goals: Progressing toward goals    Frequency    Min 5X/week      PT Plan Current plan remains appropriate    Co-evaluation              AM-PAC PT "6 Clicks" Mobility   Outcome Measure  Help needed turning from your back to your side while in a flat bed without using bedrails?: A Little Help needed moving from lying on your back to sitting on the side of a flat bed without using bedrails?: A Little Help needed moving to and from a bed to a chair (including a wheelchair)?: A Lot Help needed standing up from a chair using your arms (e.g., wheelchair or bedside chair)?: A Lot Help needed to walk in hospital room?: A Lot Help needed climbing 3-5 steps with a railing? : Total 6 Click Score: 13    End of Session Equipment Utilized During Treatment: Gait belt Activity Tolerance: Patient tolerated treatment well Patient left: with family/visitor present;in chair;with call bell/phone within reach Nurse Communication: Mobility status(informed nurse tech of need for stedy for back to bed,) PT Visit Diagnosis: Unsteadiness on feet (R26.81);Muscle weakness (generalized)  (M62.81);History of falling (Z91.81);Difficulty in walking, not elsewhere classified (R26.2);Pain Pain - Right/Left: Right Pain - part of body: Hip     Time: 1710-1731 PT Time Calculation (min) (ACUTE ONLY): 21 min  Charges:  $Gait Training: 8-22 mins                     Erasmo Leventhal , PTA Acute Rehabilitation Services Pager [phone] Office [phone]     Dezhane Staten Eli Hose 12/17/2019, 6:26 PM

## 2019-12-17 NOTE — Discharge Summary (Addendum)
Physician Discharge Summary  [name] [id number] DOB: 30-May-1946 DOA: 12/06/2019  PCP: [name], MD  Admit date: 12/06/2019 Discharge date: 12/18/2019  Admitted From: Home Disposition:  [facility]  Recommendations for Outpatient Follow-up:  1. Follow up with PCP in 1-2 weeks 2. Follow-up with orthopedics, Dr. Alvan Dame for further evaluation of nonoperative pelvic fracture in 4 weeks.  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   Work-up in the emergency department revealedoxygen saturation level of 87% on room air, mild tachypnea, left pubic bone/pubic ramus fracture superimposed on old left inferior pubic ramus fractures. Also with left hand laceration. PT recommended CIR but insurance declined initially and declined once again family appealed.  Currently awaiting SNF placement.  Hospital course:  Pelvic fracture secondary to fall.  Patient with a history of recurrent falls related to sensory ataxia due to history of viral meningeal encephalitis. X-ray revealedacute fracture left superior and inferior pubic rami. Case discussed with [facility], Dr. Alvan Dame in the emergency department who recommended weightbearing as tolerated and outpatient follow-up in 4 weeks. Knee pain control with oxycodone 5 mg every 6 hours as needed.  Continue therapy efforts at SNF.  Hypoxia/atelectasis.Resolved. Upon presentation patient's oxygen saturation level at room air was 87%. Chest x-ray revealed some atelectasis without  infiltrate. Now oxygenating well on room air.  Continue incentive spirometry.  Acute kidney injury: Resolved . Creatinine was 1.18 on admission; baseline is close to 0.8. Resolved with IV fluid hydration.   Left hand laceration. Reapproximated with sutures in the ED.  X-ray with no fracture/dislocation.  Not well approximated over the first left proximal metacarpal phalangeal joint sutures removed on 12/16/2019.  Continue local wound care.  Chronic urinary retention/neurogenic bladder.  Continue home Bactrim for suppressive therapy  History of HSV and encephalomyelitis.  Stable at baseline. Continue acyclovir 400 mg p.o. twice daily  Chronic pain.  Continues Cymbalta 30mg  PO qAm and 60mg  qPM, Voltaren topical gel.  On oxycodone as above.  Insomnia.  Continue home Seroquel 25mg  PO qHS  GERD.  Continue PPI  Discharge Diagnoses:  Active Problems:   Encephalomyelitis   Neuropathic pain   Neurogenic bladder   Ataxia   Gait abnormality   Chronic pain syndrome   Pelvic fracture (HCC)   Laceration of left hand   DNR (do not resuscitate)    Discharge Instructions  Discharge Instructions    Call MD for:  difficulty breathing, headache or visual disturbances   Complete by: As directed    Call MD for:  extreme fatigue   Complete by: As directed    Call MD for:  persistant dizziness or light-headedness   Complete by: As directed    Call MD for:  persistant nausea and vomiting   Complete by: As directed    Call MD for:  severe uncontrolled pain   Complete by: As directed    Call MD for:  temperature >100.4   Complete by: As directed    Diet - low sodium heart healthy   Complete by: As directed    Diet - low sodium heart healthy   Complete by: As  directed    Increase activity slowly   Complete by: As directed    Increase activity slowly   Complete by: As directed      Allergies as of 12/18/2019      Reactions   Demerol [meperidine] Other (See Comments)    Hallucinations   Percocet [oxycodone-acetaminophen] Itching   Amoxicillin-pot Clavulanate Diarrhea   Severe pain, headache, intestinal infection   Penicillins Itching, Rash   Has patient had a PCN reaction causing immediate rash, facial/tongue/throat swelling, SOB or lightheadedness with hypotension:  NO Has patient had a PCN reaction causing severe rash involving mucus membranes or skin necrosis: No Has patient had a PCN reaction that required hospitalization: No Has patient had a PCN reaction occurring within the last 10 years: Yes If all of the above answers are "NO", then may proceed with Cephalosporin use.      Medication List    STOP taking these medications   HYDROcodone-acetaminophen 10-325 MG tablet Commonly known as: NORCO   pregabalin 50 MG capsule Commonly known as: LYRICA     TAKE these medications   acyclovir 400 MG tablet Commonly known as: ZOVIRAX TAKE 1 TABLET BY MOUTH TWICE A DAY What changed: when to take this   CALCIUM 600-D PO Take 1 tablet by mouth in the morning and at bedtime.   conjugated estrogens vaginal cream Commonly known as: PREMARIN Place 1 Applicatorful vaginally daily as needed (itching).   diazepam 5 MG tablet Commonly known as: VALIUM Take 1 tablet (5 mg total) by mouth 2 (two) times daily. For muscle spasms   DULoxetine 30 MG capsule Commonly known as: CYMBALTA TAKE 1 CAPSULE BY MOUTH EVERY DAY What changed:   how much to take  when to take this   DULoxetine 60 MG capsule Commonly known as: CYMBALTA Take 1 capsule (60 mg total) by mouth at bedtime. What changed: You were already taking a medication with the same name, and this prescription was added. Make sure you understand how and when to take each.   DULoxetine 60 MG capsule Commonly known as: CYMBALTA Take 1 capsule (60 mg total) by mouth at bedtime. What changed: You were already taking a medication with the same name, and this prescription was added. Make sure you  understand how and when to take each.   DULoxetine 30 MG capsule Commonly known as: CYMBALTA Take 1 capsule (30 mg total) by mouth every morning. What changed: You were already taking a medication with the same name, and this prescription was added. Make sure you understand how and when to take each.   Hair/Skin/Nails/Biotin Tabs Take 1 tablet by mouth daily.   ipratropium-albuterol 0.5-2.5 (3) MG/3ML Soln Commonly known as: DUONEB Take 3 mLs by nebulization every 4 (four) hours as needed (For SOB and wheezing).   omega-3 acid ethyl esters 1 g capsule Commonly known as: LOVAZA Take 1 g by mouth daily.   oxyCODONE 5 MG immediate release tablet Commonly known as: Oxy IR/ROXICODONE Take 1 tablet (5 mg total) by mouth every 6 (six) hours as needed for up to 8 days for severe pain.   pantoprazole 40 MG tablet Commonly known as: PROTONIX Take 1 tablet (40 mg total) by mouth daily. What changed: when to take this   polyethylene glycol 17 g packet Commonly known as: MIRALAX / GLYCOLAX Take 17 g by mouth daily. What changed:   when to take this  reasons to take this   QUEtiapine 25 MG tablet Commonly known as: SEROquel Take  1 tablet (25 mg total) by mouth at bedtime.   sulfamethoxazole-trimethoprim 400-80 MG tablet Commonly known as: BACTRIM Take 0.5 tablets by mouth daily.   VITAMIN B-12 PO Take 1 tablet by mouth daily.   Voltaren 1 % Gel Generic drug: diclofenac Sodium Apply 1 application topically daily as needed (pain).       Contact information for follow-up providers    Paralee Cancel, MD. Schedule an appointment as soon as possible for a visit in 4 week(s).   Specialty: Orthopedic Surgery Contact information: [address] [place] [address] [phone]        [name], MD. Schedule an appointment as soon as possible for a visit in 1 week(s).   Specialty: Family Medicine Contact information: [place]  [address] [place] [address] [phone]            Contact information for after-discharge care    Destination    [facility] .   Service: Skilled Nursing Contact information: [address] [place] [address] [phone]                 Allergies  Allergen Reactions  . Demerol [Meperidine] Other (See Comments)    Hallucinations  . Percocet [Oxycodone-Acetaminophen] Itching  . Amoxicillin-Pot Clavulanate Diarrhea    Severe pain, headache, intestinal infection  . Penicillins Itching and Rash    Has patient had a PCN reaction causing immediate rash, facial/tongue/throat swelling, SOB or lightheadedness with hypotension:  NO Has patient had a PCN reaction causing severe rash involving mucus membranes or skin necrosis: No Has patient had a PCN reaction that required hospitalization: No Has patient had a PCN reaction occurring within the last 10 years: Yes If all of the above answers are "NO", then may proceed with Cephalosporin use.    Procedures/Studies: CT Head Wo Contrast  Result Date: 12/06/2019 CLINICAL DATA:  [place]. Hit head. EXAM: CT HEAD WITHOUT CONTRAST CT CERVICAL SPINE WITHOUT CONTRAST TECHNIQUE: Multidetector CT imaging of the head and cervical spine was performed following the standard protocol without intravenous contrast. Multiplanar CT image reconstructions of the cervical spine were also generated. COMPARISON:  None. FINDINGS: CT HEAD FINDINGS Brain: Age related cerebral atrophy, ventriculomegaly and fairly extensive periventricular white matter disease. No extra-axial fluid collections are identified. No CT findings for acute hemispheric infarction or intracranial hemorrhage. No mass lesions. The brainstem and cerebellum are normal. Vascular: Moderate vascular calcifications. No aneurysm or hyperdense vessels. Skull: No skull fracture or bone lesions. Sinuses/Orbits: The paranasal sinuses and mastoid air cells  are clear. Small amount of fluid in the right mastoid air cells. The globes are intact. Other: No scalp lesions or hematoma. CT CERVICAL SPINE FINDINGS Alignment: Mild reversal of the normal cervical lordosis. The cervical vertebral bodies are normally aligned. Skull base and vertebrae: Advanced degenerative changes noted at C1-2 but no dens or other vertebral body fractures. The facets are normally aligned. No facet or laminar fractures. The skull base C1 and C1-2 articulations are maintained. Soft tissues and spinal canal: No prevertebral fluid or swelling. No visible canal hematoma. Disc levels: The spinal canal is fairly generous. No significant spinal or foraminal stenosis. Upper chest: The upper ribs are intact and the visualized lung apices are grossly clear. Other: No neck mass or adenopathy. IMPRESSION: 1. Age related cerebral atrophy, ventriculomegaly and fairly extensive periventricular white matter disease. 2. No acute intracranial findings or skull fracture. 3. Normal alignment of the cervical vertebral bodies  and no acute cervical spine fracture. 4. Moderate degenerative cervical spondylosis with multilevel disc disease and facet disease. Electronically Signed   By: Marijo Sanes M.D.   On: 12/06/2019 15:34   CT Cervical Spine Wo Contrast  Result Date: 12/06/2019 CLINICAL DATA:  [place]. Hit head. EXAM: CT HEAD WITHOUT CONTRAST CT CERVICAL SPINE WITHOUT CONTRAST TECHNIQUE: Multidetector CT imaging of the head and cervical spine was performed following the standard protocol without intravenous contrast. Multiplanar CT image reconstructions of the cervical spine were also generated. COMPARISON:  None. FINDINGS: CT HEAD FINDINGS Brain: Age related cerebral atrophy, ventriculomegaly and fairly extensive periventricular white matter disease. No extra-axial fluid collections are identified. No CT findings for acute hemispheric infarction or intracranial hemorrhage. No mass lesions. The brainstem and  cerebellum are normal. Vascular: Moderate vascular calcifications. No aneurysm or hyperdense vessels. Skull: No skull fracture or bone lesions. Sinuses/Orbits: The paranasal sinuses and mastoid air cells are clear. Small amount of fluid in the right mastoid air cells. The globes are intact. Other: No scalp lesions or hematoma. CT CERVICAL SPINE FINDINGS Alignment: Mild reversal of the normal cervical lordosis. The cervical vertebral bodies are normally aligned. Skull base and vertebrae: Advanced degenerative changes noted at C1-2 but no dens or other vertebral body fractures. The facets are normally aligned. No facet or laminar fractures. The skull base C1 and C1-2 articulations are maintained. Soft tissues and spinal canal: No prevertebral fluid or swelling. No visible canal hematoma. Disc levels: The spinal canal is fairly generous. No significant spinal or foraminal stenosis. Upper chest: The upper ribs are intact and the visualized lung apices are grossly clear. Other: No neck mass or adenopathy. IMPRESSION: 1. Age related cerebral atrophy, ventriculomegaly and fairly extensive periventricular white matter disease. 2. No acute intracranial findings or skull fracture. 3. Normal alignment of the cervical vertebral bodies and no acute cervical spine fracture. 4. Moderate degenerative cervical spondylosis with multilevel disc disease and facet disease. Electronically Signed   By: Marijo Sanes M.D.   On: 12/06/2019 15:34   CT PELVIS WO CONTRAST  Result Date: 12/06/2019 CLINICAL DATA:  Possible pelvic fractures on plain films EXAM: CT PELVIS WITHOUT CONTRAST TECHNIQUE: Multidetector CT imaging of the pelvis was performed following the standard protocol without intravenous contrast. COMPARISON:  Plain films earlier today FINDINGS: Urinary Tract:  No abnormality visualized. Bowel:  Unremarkable visualized pelvic bowel loops. Vascular/Lymphatic: Aortoiliac atherosclerosis.  No adenopathy. Reproductive:  Prior  hysterectomy.  No adnexal masses. Other:  No free fluid or free air. Musculoskeletal: Old healed left inferior pubic ramus fracture noted. There is also acute fractures involving the left superior pubic ramus and left pubic bone best seen on coronal imaging, and left inferior pubic ramus best seen on axial images. No proximal femoral abnormality. No subluxation or dislocation. Prior vertebral augmentation at L5. IMPRESSION: Acute fractures through the left superior and inferior pubic rami as well as left pubic bone. Old healed left inferior pubic ramus fracture. Electronically Signed   By: Rolm Baptise M.D.   On: 12/06/2019 22:10   DG CHEST PORT 1 VIEW  Result Date: 12/06/2019 CLINICAL DATA:  Hypoxia. EXAM: PORTABLE CHEST 1 VIEW COMPARISON:  Radiograph and CT 07/23/2019 FINDINGS: The cardiomediastinal contours are normal. Minor bibasilar atelectasis. Pulmonary vasculature is normal. No consolidation, pleural effusion, or pneumothorax. No acute osseous abnormalities are seen. IMPRESSION: Minor bibasilar atelectasis. Electronically Signed   By: Keith Rake M.D.   On: 12/06/2019 18:45   DG Hand Complete Left  Result Date: 12/06/2019  CLINICAL DATA:  Fall, pain. EXAM: LEFT HAND - COMPLETE 3+ VIEW COMPARISON:  None. FINDINGS: Osseous alignment is normal. No fracture line or displaced fracture fragment is seen. Degenerative osteoarthritis at the first [facility] joint, moderate in degree with osteophyte formation. Additional degenerative changes at the second through fifth PIP joints, mild to moderate in degree. IMPRESSION: 1. No acute findings. No osseous fracture or dislocation seen. 2. Degenerative osteoarthritis, most prominent at the first [facility] joint. Electronically Signed   By: Franki Cabot M.D.   On: 12/06/2019 15:24   DG Hip Unilat W or Wo Pelvis 2-3 Views Left  Result Date: 12/06/2019 CLINICAL DATA:  Fall, pain EXAM: DG HIP (WITH OR WITHOUT PELVIS) 2-3V LEFT COMPARISON:  04/26/2017 FINDINGS: Old left  inferior pubic ramus fracture noted. There is lucency throughout the superior pubic ramus and pubic bone on the left which could reflect superimposed acute fracture. No proximal femoral abnormality. Hip joints and SI joints are symmetric. IMPRESSION: Old left inferior pubic ramus fracture. Concern for superimposed left pubic bone and superior pubic ramus fractures. Electronically Signed   By: Rolm Baptise M.D.   On: 12/06/2019 15:22     Subjective: Patient seen and examined at bedside, resting comfortably. Husband present. No complaints. Ready for DC to SNF this am.  Discharge Exam: Vitals:   12/18/19 0350 12/18/19 0855  BP: (!) 86/57 91/69  Pulse: 73 77  Resp: 16 17  Temp: 98.2 F (36.8 C) (!) 97.3 F (36.3 C)  SpO2: 91% 94%   Vitals:   12/17/19 1450 12/17/19 2013 12/18/19 0350 12/18/19 0855  BP: 99/67 100/69 (!) 86/57 91/69  Pulse: 70 82 73 77  Resp: 17 16 16 17   Temp: 98.4 F (36.9 C) 98.4 F (36.9 C) 98.2 F (36.8 C) (!) 97.3 F (36.3 C)  TempSrc: Oral Oral Oral Oral  SpO2: 93% 93% 91% 94%  Weight:      Height:        General exam: Appears calm and comfortable  Respiratory system: Clear to auscultation. Respiratory effort normal.  Oxygenating well on room air. Cardiovascular system: S1 & S2 heard, RRR. No JVD, murmurs, rubs, gallops or clicks. No pedal edema. Gastrointestinal system: Abdomen is nondistended, soft and nontender. No organomegaly or masses felt. Normal bowel sounds heard. Central nervous system: Alert and oriented. No focal neurological deficits. Extremities: Symmetric 5 x 5 power. Skin: Noted left hand wound with Steri-Strips in place; not well approximated over left first proximal metacarpal phalangeal joint Psychiatry: Judgement and insight appear normal. Mood & affect appropriate.     The results of significant diagnostics from this hospitalization (including imaging, microbiology, ancillary and laboratory) are listed below for reference.      Microbiology: Recent Results (from the past 240 hour(s))  SARS CORONAVIRUS 2 (TAT 6-24 HRS) Nasopharyngeal Nasopharyngeal Swab     Status: None   Collection Time: 12/15/19  5:46 PM   Specimen: Nasopharyngeal Swab  Result Value Ref Range Status   SARS Coronavirus 2 NEGATIVE NEGATIVE Final    Comment: (NOTE) SARS-CoV-2 target nucleic acids are NOT DETECTED. The SARS-CoV-2 RNA is generally detectable in upper and lower respiratory specimens during the acute phase of infection. Negative results do not preclude SARS-CoV-2 infection, do not rule out co-infections with other pathogens, and should not be used as the sole basis for treatment or other patient management decisions. Negative results must be combined with clinical observations, patient history, and epidemiological information. The expected result is Negative. Fact Sheet for Patients: SugarRoll.be  Fact Sheet for Healthcare Providers: https://www.woods-mathews.com/ This test is not yet approved or cleared by the [place] FDA and  has been authorized for detection and/or diagnosis of SARS-CoV-2 by FDA under an Emergency Use Authorization (EUA). This EUA will remain  in effect (meaning this test can be used) for the duration of the COVID-19 declaration under Section 56 4(b)(1) of the Act, 21 U.S.C. section 360bbb-3(b)(1), unless the authorization is terminated or revoked sooner. Performed at [facility], [place] [address] [place], [name] [address]   SARS CORONAVIRUS 2 (TAT 6-24 HRS) Nasopharyngeal Nasopharyngeal Swab     Status: None   Collection Time: 12/17/19  4:50 PM   Specimen: Nasopharyngeal Swab  Result Value Ref Range Status   SARS Coronavirus 2 NEGATIVE NEGATIVE Final    Comment: (NOTE) SARS-CoV-2 target nucleic acids are NOT DETECTED. The SARS-CoV-2 RNA is generally detectable in upper and lower respiratory specimens during the acute phase of infection.  Negative results do not preclude SARS-CoV-2 infection, do not rule out co-infections with other pathogens, and should not be used as the sole basis for treatment or other patient management decisions. Negative results must be combined with clinical observations, patient history, and epidemiological information. The expected result is Negative. Fact Sheet for Patients: SugarRoll.be Fact Sheet for Healthcare Providers: https://www.woods-mathews.com/ This test is not yet approved or cleared by the [place] FDA and  has been authorized for detection and/or diagnosis of SARS-CoV-2 by FDA under an Emergency Use Authorization (EUA). This EUA will remain  in effect (meaning this test can be used) for the duration of the COVID-19 declaration under Section 56 4(b)(1) of the Act, 21 U.S.C. section 360bbb-3(b)(1), unless the authorization is terminated or revoked sooner. Performed at [facility], [place] [address], [place], [place] [address]      Labs: BNP (last 3 results) No results for input(s): BNP in the last 8760 hours. Basic Metabolic Panel: No results for input(s): NA, K, CL, CO2, GLUCOSE, BUN, CREATININE, CALCIUM, MG, PHOS in the last 168 hours. Liver Function Tests: No results for input(s): AST, ALT, ALKPHOS, BILITOT, PROT, ALBUMIN in the last 168 hours. No results for input(s): LIPASE, AMYLASE in the last 168 hours. No results for input(s): AMMONIA in the last 168 hours. CBC: No results for input(s): WBC, NEUTROABS, HGB, HCT, MCV, PLT in the last 168 hours. Cardiac Enzymes: No results for input(s): CKTOTAL, CKMB, CKMBINDEX, TROPONINI in the last 168 hours. BNP: Invalid input(s): POCBNP CBG: No results for input(s): GLUCAP in the last 168 hours. D-Dimer No results for input(s): DDIMER in the last 72 hours. Hgb A1c No results for input(s): HGBA1C in the last 72 hours. Lipid Profile No results for input(s): CHOL, HDL, LDLCALC,  TRIG, CHOLHDL, LDLDIRECT in the last 72 hours. Thyroid function studies No results for input(s): TSH, T4TOTAL, T3FREE, THYROIDAB in the last 72 hours.  Invalid input(s): FREET3 Anemia work up No results for input(s): VITAMINB12, FOLATE, FERRITIN, TIBC, IRON, RETICCTPCT in the last 72 hours. Urinalysis    Component Value Date/Time   COLORURINE AMBER (A) 07/23/2019 0059   APPEARANCEUR CLOUDY (A) 07/23/2019 0059   LABSPEC 1.011 07/23/2019 0059   PHURINE 5.0 07/23/2019 0059   GLUCOSEU NEGATIVE 07/23/2019 0059   HGBUR LARGE (A) 07/23/2019 0059   BILIRUBINUR NEGATIVE 07/23/2019 [place] 07/23/2019 0059   PROTEINUR 30 (A) 07/23/2019 0059   NITRITE NEGATIVE 07/23/2019 0059   LEUKOCYTESUR LARGE (A) 07/23/2019 0059   Sepsis Labs Invalid input(s): PROCALCITONIN,  WBC,  LACTICIDVEN Microbiology Recent Results (from the past 240 hour(s))  SARS CORONAVIRUS 2 (TAT 6-24 HRS) Nasopharyngeal Nasopharyngeal Swab     Status: None   Collection Time: 12/15/19  5:46 PM   Specimen: Nasopharyngeal Swab  Result Value Ref Range Status   SARS Coronavirus 2 NEGATIVE NEGATIVE Final    Comment: (NOTE) SARS-CoV-2 target nucleic acids are NOT DETECTED. The SARS-CoV-2 RNA is generally detectable in upper and lower respiratory specimens during the acute phase of infection. Negative results do not preclude SARS-CoV-2 infection, do not rule out co-infections with other pathogens, and should not be used as the sole basis for treatment or other patient management decisions. Negative results must be combined with clinical observations, patient history, and epidemiological information. The expected result is Negative. Fact Sheet for Patients: SugarRoll.be Fact Sheet for Healthcare Providers: https://www.woods-mathews.com/ This test is not yet approved or cleared by the [place] FDA and  has been authorized for detection and/or diagnosis of SARS-CoV-2  by FDA under an Emergency Use Authorization (EUA). This EUA will remain  in effect (meaning this test can be used) for the duration of the COVID-19 declaration under Section 56 4(b)(1) of the Act, 21 U.S.C. section 360bbb-3(b)(1), unless the authorization is terminated or revoked sooner. Performed at [facility], [place] [address] [place], [name] [address]   SARS CORONAVIRUS 2 (TAT 6-24 HRS) Nasopharyngeal Nasopharyngeal Swab     Status: None   Collection Time: 12/17/19  4:50 PM   Specimen: Nasopharyngeal Swab  Result Value Ref Range Status   SARS Coronavirus 2 NEGATIVE NEGATIVE Final    Comment: (NOTE) SARS-CoV-2 target nucleic acids are NOT DETECTED. The SARS-CoV-2 RNA is generally detectable in upper and lower respiratory specimens during the acute phase of infection. Negative results do not preclude SARS-CoV-2 infection, do not rule out co-infections with other pathogens, and should not be used as the sole basis for treatment or other patient management decisions. Negative results must be combined with clinical observations, patient history, and epidemiological information. The expected result is Negative. Fact Sheet for Patients: SugarRoll.be Fact Sheet for Healthcare Providers: https://www.woods-mathews.com/ This test is not yet approved or cleared by the [place] FDA and  has been authorized for detection and/or diagnosis of SARS-CoV-2 by FDA under an Emergency Use Authorization (EUA). This EUA will remain  in effect (meaning this test can be used) for the duration of the COVID-19 declaration under Section 56 4(b)(1) of the Act, 21 U.S.C. section 360bbb-3(b)(1), unless the authorization is terminated or revoked sooner. Performed at [facility], [place] [address], [place], [place] [address]      Time coordinating discharge: Over 30 minutes  SIGNED:   Tzipporah Nagorski J [place] DO  [facility] 12/18/2019, 9:34  AM

## 2019-12-17 NOTE — Plan of Care (Signed)

## 2019-12-17 NOTE — TOC Transition Note (Signed)
Transition of Care [facility]) - CM/SW Discharge Note   Patient Details  Name: Katelyn Lamb MRN: [id number] Date of Birth: 01/02/1946  Transition of Care [facility]) CM/SW Contact:  Sharin Mons, RN Phone Number: [phone] 12/17/2019, 3:09 PM   Clinical Narrative:    Patient will DC to: [facility] Anticipated DC date: 12/18/2019 Family notified: husband Transport by: Corey Harold   Per MD patient ready for DC today to SNF. RN, patient, patient's family, and facility notified of DC. Discharge Summary and FL2 sent to facility. RN to call report prior to discharge [phone]). Rm [phone]. DC packet on chart. Ambulance transport requested for patient.   RNCM will sign off for now as intervention is no longer needed. Please consult [place] again if new needs arise.   Final next level of care: Skilled Nursing Facility([facility]) Barriers to Discharge: No Barriers Identified   Patient Goals and CMS Choice   CMS Medicare.gov Compare Post Acute Care list provided to:: Patient Represenative (must comment)(husband)    Discharge Placement                       Discharge Plan and Services                                     Social Determinants of Health (SDOH) Interventions     Readmission Risk Interventions No flowsheet data found.

## 2019-12-18 DIAGNOSIS — R2681 Unsteadiness on feet: Secondary | ICD-10-CM | POA: Diagnosis not present

## 2019-12-18 DIAGNOSIS — M792 Neuralgia and neuritis, unspecified: Secondary | ICD-10-CM | POA: Diagnosis not present

## 2019-12-18 DIAGNOSIS — F419 Anxiety disorder, unspecified: Secondary | ICD-10-CM | POA: Diagnosis not present

## 2019-12-18 DIAGNOSIS — R300 Dysuria: Secondary | ICD-10-CM | POA: Diagnosis not present

## 2019-12-18 DIAGNOSIS — G0490 Encephalitis and encephalomyelitis, unspecified: Secondary | ICD-10-CM | POA: Diagnosis not present

## 2019-12-18 DIAGNOSIS — G4700 Insomnia, unspecified: Secondary | ICD-10-CM | POA: Diagnosis not present

## 2019-12-18 DIAGNOSIS — R269 Unspecified abnormalities of gait and mobility: Secondary | ICD-10-CM | POA: Diagnosis not present

## 2019-12-18 DIAGNOSIS — R262 Difficulty in walking, not elsewhere classified: Secondary | ICD-10-CM | POA: Diagnosis not present

## 2019-12-18 DIAGNOSIS — F329 Major depressive disorder, single episode, unspecified: Secondary | ICD-10-CM | POA: Diagnosis not present

## 2019-12-18 DIAGNOSIS — Z66 Do not resuscitate: Secondary | ICD-10-CM | POA: Diagnosis not present

## 2019-12-18 DIAGNOSIS — S61412D Laceration without foreign body of left hand, subsequent encounter: Secondary | ICD-10-CM | POA: Diagnosis not present

## 2019-12-18 DIAGNOSIS — R278 Other lack of coordination: Secondary | ICD-10-CM | POA: Diagnosis not present

## 2019-12-18 DIAGNOSIS — R270 Ataxia, unspecified: Secondary | ICD-10-CM | POA: Diagnosis not present

## 2019-12-18 DIAGNOSIS — R41841 Cognitive communication deficit: Secondary | ICD-10-CM | POA: Diagnosis not present

## 2019-12-18 DIAGNOSIS — Z8661 Personal history of infections of the central nervous system: Secondary | ICD-10-CM | POA: Diagnosis not present

## 2019-12-18 DIAGNOSIS — M6281 Muscle weakness (generalized): Secondary | ICD-10-CM | POA: Diagnosis not present

## 2019-12-18 DIAGNOSIS — B0082 Herpes simplex myelitis: Secondary | ICD-10-CM | POA: Diagnosis not present

## 2019-12-18 DIAGNOSIS — S3289XA Fracture of other parts of pelvis, initial encounter for closed fracture: Secondary | ICD-10-CM | POA: Diagnosis not present

## 2019-12-18 DIAGNOSIS — R5381 Other malaise: Secondary | ICD-10-CM | POA: Diagnosis not present

## 2019-12-18 DIAGNOSIS — F29 Unspecified psychosis not due to a substance or known physiological condition: Secondary | ICD-10-CM | POA: Diagnosis not present

## 2019-12-18 DIAGNOSIS — W19XXXD Unspecified fall, subsequent encounter: Secondary | ICD-10-CM | POA: Diagnosis not present

## 2019-12-18 DIAGNOSIS — G894 Chronic pain syndrome: Secondary | ICD-10-CM | POA: Diagnosis not present

## 2019-12-18 DIAGNOSIS — R2689 Other abnormalities of gait and mobility: Secondary | ICD-10-CM | POA: Diagnosis not present

## 2019-12-18 DIAGNOSIS — S32592D Other specified fracture of left pubis, subsequent encounter for fracture with routine healing: Secondary | ICD-10-CM | POA: Diagnosis not present

## 2019-12-18 DIAGNOSIS — S32409A Unspecified fracture of unspecified acetabulum, initial encounter for closed fracture: Secondary | ICD-10-CM | POA: Diagnosis not present

## 2019-12-18 DIAGNOSIS — Z743 Need for continuous supervision: Secondary | ICD-10-CM | POA: Diagnosis not present

## 2019-12-18 DIAGNOSIS — S32592G Other specified fracture of left pubis, subsequent encounter for fracture with delayed healing: Secondary | ICD-10-CM | POA: Diagnosis not present

## 2019-12-18 DIAGNOSIS — K219 Gastro-esophageal reflux disease without esophagitis: Secondary | ICD-10-CM | POA: Diagnosis not present

## 2019-12-18 DIAGNOSIS — N319 Neuromuscular dysfunction of bladder, unspecified: Secondary | ICD-10-CM | POA: Diagnosis not present

## 2019-12-18 DIAGNOSIS — G8929 Other chronic pain: Secondary | ICD-10-CM | POA: Diagnosis not present

## 2019-12-18 DIAGNOSIS — R0902 Hypoxemia: Secondary | ICD-10-CM | POA: Diagnosis not present

## 2019-12-18 DIAGNOSIS — S61412A Laceration without foreign body of left hand, initial encounter: Secondary | ICD-10-CM | POA: Diagnosis not present

## 2019-12-18 DIAGNOSIS — R279 Unspecified lack of coordination: Secondary | ICD-10-CM | POA: Diagnosis not present

## 2019-12-18 DIAGNOSIS — Z9181 History of falling: Secondary | ICD-10-CM | POA: Diagnosis not present

## 2019-12-18 DIAGNOSIS — N179 Acute kidney failure, unspecified: Secondary | ICD-10-CM | POA: Diagnosis not present

## 2019-12-18 NOTE — Progress Notes (Signed)
Physical Therapy Treatment Patient Details Name: Katelyn Lamb MRN: [id number] DOB: 1946/01/14 Today's Date: 12/18/2019    History of Present Illness Katelyn Lamb is a 74 y.o. 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 Imaging: CT head negative for acute intracranial abnormalities, CT cervical spine showed normal alignment of cervical vertebral bodies. X-ray of left hand no fractures or dislocations.    PT Comments    Pt supine in bed on arrival this session.  Pt warmed up with LE exercises.  Post exercises move to right side of bed to prepare for transfer into standing.  Pt required assistance of min-mod this session.  Balance is much improved but remains unsteady.  Plan is for d/c to SNF this am.  Pt continues to benefit from aggressive rehab in a post acute setting.     Follow Up Recommendations  CIR     Equipment Recommendations  Other (comment)    Recommendations for Other Services       Precautions / Restrictions Precautions Precautions: Fall Precaution Comments: spouse reports pt told to avoid L hand composite flexion to allow skin to heal and to maintain suture placement Restrictions Weight Bearing Restrictions: Yes    Mobility  Bed Mobility Overal bed mobility: Needs Assistance Bed Mobility: Rolling Rolling: Mod assist   Supine to sit: Mod assist;+2 for physical assistance     General bed mobility comments: Rolling to R side today with improved tolerance.  Assistance to rise into seated position and use of bed pad to boost to edge of bed.  Transfers Overall transfer level: Needs assistance Equipment used: Left platform walker Transfers: Sit to/from  Stand Sit to Stand: Min assist;+2 physical assistance         General transfer comment: Pt continues to require cues for hand placement to and seated surface.  Bed in elevated position and cues to remove hand from platform before sitting.  Ambulation/Gait Ambulation/Gait assistance: Mod assist;+2 safety/equipment Gait Distance (Feet): 45 Feet Assistive device: Left platform walker Gait Pattern/deviations: Step-to pattern;Shuffle;Narrow base of support;Trunk flexed;Decreased stance time - left;Decreased weight shift to left Gait velocity: decreased   General Gait Details: Pt with improved weight shifting to L and R foot clearance.  Cues for upper trunk control.  Pt fatigues quickly but balance is much improved from last session.   Stairs             Wheelchair Mobility    Modified Rankin (Stroke Patients Only)       Balance Overall balance assessment: Needs assistance Sitting-balance support: Single extremity supported;Feet supported Sitting balance-Leahy Scale: Fair       Standing balance-Leahy Scale: Poor                              Cognition Arousal/Alertness: Awake/alert Behavior During Therapy: WFL for tasks assessed/performed Overall Cognitive Status: Impaired/Different from baseline                                 General Comments: cognition not formally assessed but remains slow to process with poor safety awareness.      Exercises General Exercises - Lower Extremity Ankle  Circles/Pumps: AROM;Both;20 reps;Supine Quad Sets: AROM;Both;10 reps;Supine Heel Slides: AROM;Both;10 reps;Supine    General Comments        Pertinent Vitals/Pain Pain Assessment: Faces Faces Pain Scale: Hurts little more Pain Location: Lt hip, B pelvis Pain Descriptors / Indicators: Aching;Dull;Guarding;Grimacing Pain Intervention(s): Monitored during session;Repositioned    Home Living                      Prior Function             PT Goals (current goals can now be found in the care plan section) Acute Rehab PT Goals Potential to Achieve Goals: Fair Progress towards PT goals: Progressing toward goals    Frequency    Min 5X/week      PT Plan Current plan remains appropriate    Co-evaluation              AM-PAC PT "6 Clicks" Mobility   Outcome Measure  Help needed turning from your back to your side while in a flat bed without using bedrails?: A Little Help needed moving from lying on your back to sitting on the side of a flat bed without using bedrails?: A Little Help needed moving to and from a bed to a chair (including a wheelchair)?: A Lot Help needed standing up from a chair using your arms (e.g., wheelchair or bedside chair)?: A Lot Help needed to walk in hospital room?: A Lot Help needed climbing 3-5 steps with a railing? : Total 6 Click Score: 13    End of Session Equipment Utilized During Treatment: Gait belt Activity Tolerance: Patient tolerated treatment well Patient left: with family/visitor present;in chair;with call bell/phone within reach Nurse Communication: Mobility status PT Visit Diagnosis: Unsteadiness on feet (R26.81);Muscle weakness (generalized) (M62.81);History of falling (Z91.81);Difficulty in walking, not elsewhere classified (R26.2);Pain Pain - Right/Left: Right Pain - part of body: Hip     Time: 1030-1058 PT Time Calculation (min) (ACUTE ONLY): 28 min  Charges:  $Gait Training: 8-22 mins $Therapeutic Activity: 8-22 mins                     Erasmo Leventhal , PTA Acute Rehabilitation Services Pager [phone] Office [phone]     Katelyn Lamb 12/18/2019, 12:34 PM

## 2019-12-18 NOTE — Plan of Care (Signed)

## 2019-12-18 NOTE — Progress Notes (Addendum)
Attempted to call report to [facility]- only a recording for the charge nurse was available.  A message was left with name and number to return.  PTAR has been set up for 11 am

## 2019-12-18 NOTE — Plan of Care (Signed)
  Problem: Clinical Measurements: Goal: Will remain free from infection Outcome: Progressing   Problem: Elimination: Goal: Will not experience complications related to bowel motility Outcome: Progressing   Problem: Pain Managment: Goal: General experience of comfort will improve Outcome: Progressing   

## 2019-12-18 NOTE — Progress Notes (Signed)
Report called to [facility] adjuster, chartered) at [facility].  A discharge packet has been printed and will be sent with the patient via PTAR

## 2019-12-21 DIAGNOSIS — Z8661 Personal history of infections of the central nervous system: Secondary | ICD-10-CM | POA: Diagnosis not present

## 2019-12-21 DIAGNOSIS — N319 Neuromuscular dysfunction of bladder, unspecified: Secondary | ICD-10-CM | POA: Diagnosis not present

## 2019-12-21 DIAGNOSIS — S32592G Other specified fracture of left pubis, subsequent encounter for fracture with delayed healing: Secondary | ICD-10-CM | POA: Diagnosis not present

## 2019-12-21 DIAGNOSIS — G8929 Other chronic pain: Secondary | ICD-10-CM | POA: Diagnosis not present

## 2019-12-21 DIAGNOSIS — W19XXXD Unspecified fall, subsequent encounter: Secondary | ICD-10-CM | POA: Diagnosis not present

## 2019-12-29 ENCOUNTER — Other Ambulatory Visit: Payer: Self-pay | Admitting: Neurology

## 2019-12-29 DIAGNOSIS — G8222 Paraplegia, incomplete: Secondary | ICD-10-CM

## 2019-12-29 DIAGNOSIS — G0490 Encephalitis and encephalomyelitis, unspecified: Secondary | ICD-10-CM

## 2020-01-14 DIAGNOSIS — R300 Dysuria: Secondary | ICD-10-CM | POA: Diagnosis not present

## 2020-01-18 DIAGNOSIS — Z9181 History of falling: Secondary | ICD-10-CM | POA: Diagnosis not present

## 2020-01-18 DIAGNOSIS — Z87440 Personal history of urinary (tract) infections: Secondary | ICD-10-CM | POA: Diagnosis not present

## 2020-01-18 DIAGNOSIS — G8929 Other chronic pain: Secondary | ICD-10-CM | POA: Diagnosis not present

## 2020-01-18 DIAGNOSIS — R32 Unspecified urinary incontinence: Secondary | ICD-10-CM | POA: Diagnosis not present

## 2020-01-18 DIAGNOSIS — A86 Unspecified viral encephalitis: Secondary | ICD-10-CM | POA: Diagnosis not present

## 2020-01-18 DIAGNOSIS — M549 Dorsalgia, unspecified: Secondary | ICD-10-CM | POA: Diagnosis not present

## 2020-01-18 DIAGNOSIS — Z792 Long term (current) use of antibiotics: Secondary | ICD-10-CM | POA: Diagnosis not present

## 2020-01-18 DIAGNOSIS — G4700 Insomnia, unspecified: Secondary | ICD-10-CM | POA: Diagnosis not present

## 2020-01-18 DIAGNOSIS — Z87891 Personal history of nicotine dependence: Secondary | ICD-10-CM | POA: Diagnosis not present

## 2020-01-18 DIAGNOSIS — K5730 Diverticulosis of large intestine without perforation or abscess without bleeding: Secondary | ICD-10-CM | POA: Diagnosis not present

## 2020-01-18 DIAGNOSIS — Z79899 Other long term (current) drug therapy: Secondary | ICD-10-CM | POA: Diagnosis not present

## 2020-01-18 DIAGNOSIS — K219 Gastro-esophageal reflux disease without esophagitis: Secondary | ICD-10-CM | POA: Diagnosis not present

## 2020-01-18 DIAGNOSIS — K5900 Constipation, unspecified: Secondary | ICD-10-CM | POA: Diagnosis not present

## 2020-01-18 DIAGNOSIS — F419 Anxiety disorder, unspecified: Secondary | ICD-10-CM | POA: Diagnosis not present

## 2020-01-18 DIAGNOSIS — S32502D Unspecified fracture of left pubis, subsequent encounter for fracture with routine healing: Secondary | ICD-10-CM | POA: Diagnosis not present

## 2020-01-18 DIAGNOSIS — N319 Neuromuscular dysfunction of bladder, unspecified: Secondary | ICD-10-CM | POA: Diagnosis not present

## 2020-01-19 DIAGNOSIS — S329XXD Fracture of unspecified parts of lumbosacral spine and pelvis, subsequent encounter for fracture with routine healing: Secondary | ICD-10-CM | POA: Diagnosis not present

## 2020-01-19 DIAGNOSIS — N3000 Acute cystitis without hematuria: Secondary | ICD-10-CM | POA: Diagnosis not present

## 2020-01-20 ENCOUNTER — Inpatient Hospital Stay (HOSPITAL_COMMUNITY)
Admission: EM | Admit: 2020-01-20 | Discharge: 2020-01-24 | DRG: 871 | Disposition: A | Discharge status: Home Health | Payer: PPO | Attending: Internal Medicine | Admitting: Internal Medicine

## 2020-01-20 ENCOUNTER — Emergency Department (HOSPITAL_COMMUNITY): Payer: PPO

## 2020-01-20 ENCOUNTER — Inpatient Hospital Stay (HOSPITAL_COMMUNITY): Payer: PPO

## 2020-01-20 ENCOUNTER — Other Ambulatory Visit: Payer: Self-pay

## 2020-01-20 DIAGNOSIS — N179 Acute kidney failure, unspecified: Secondary | ICD-10-CM | POA: Diagnosis not present

## 2020-01-20 DIAGNOSIS — F419 Anxiety disorder, unspecified: Secondary | ICD-10-CM | POA: Diagnosis not present

## 2020-01-20 DIAGNOSIS — Z20822 Contact with and (suspected) exposure to covid-19: Secondary | ICD-10-CM | POA: Diagnosis present

## 2020-01-20 DIAGNOSIS — B9689 Other specified bacterial agents as the cause of diseases classified elsewhere: Secondary | ICD-10-CM | POA: Diagnosis not present

## 2020-01-20 DIAGNOSIS — R509 Fever, unspecified: Secondary | ICD-10-CM | POA: Diagnosis not present

## 2020-01-20 DIAGNOSIS — Z87891 Personal history of nicotine dependence: Secondary | ICD-10-CM | POA: Diagnosis not present

## 2020-01-20 DIAGNOSIS — L293 Anogenital pruritus, unspecified: Secondary | ICD-10-CM | POA: Diagnosis not present

## 2020-01-20 DIAGNOSIS — Z90710 Acquired absence of both cervix and uterus: Secondary | ICD-10-CM | POA: Diagnosis not present

## 2020-01-20 DIAGNOSIS — G8929 Other chronic pain: Secondary | ICD-10-CM | POA: Diagnosis not present

## 2020-01-20 DIAGNOSIS — R6521 Severe sepsis with septic shock: Secondary | ICD-10-CM | POA: Diagnosis present

## 2020-01-20 DIAGNOSIS — G629 Polyneuropathy, unspecified: Secondary | ICD-10-CM | POA: Diagnosis present

## 2020-01-20 DIAGNOSIS — Z87440 Personal history of urinary (tract) infections: Secondary | ICD-10-CM | POA: Diagnosis not present

## 2020-01-20 DIAGNOSIS — R0902 Hypoxemia: Secondary | ICD-10-CM | POA: Diagnosis not present

## 2020-01-20 DIAGNOSIS — R0689 Other abnormalities of breathing: Secondary | ICD-10-CM | POA: Diagnosis not present

## 2020-01-20 DIAGNOSIS — Z79899 Other long term (current) drug therapy: Secondary | ICD-10-CM

## 2020-01-20 DIAGNOSIS — D72829 Elevated white blood cell count, unspecified: Secondary | ICD-10-CM | POA: Diagnosis not present

## 2020-01-20 DIAGNOSIS — F329 Major depressive disorder, single episode, unspecified: Secondary | ICD-10-CM | POA: Diagnosis not present

## 2020-01-20 DIAGNOSIS — R0602 Shortness of breath: Secondary | ICD-10-CM | POA: Diagnosis not present

## 2020-01-20 DIAGNOSIS — R9431 Abnormal electrocardiogram [ECG] [EKG]: Secondary | ICD-10-CM | POA: Diagnosis not present

## 2020-01-20 DIAGNOSIS — Z8661 Personal history of infections of the central nervous system: Secondary | ICD-10-CM | POA: Diagnosis not present

## 2020-01-20 DIAGNOSIS — N3000 Acute cystitis without hematuria: Secondary | ICD-10-CM

## 2020-01-20 DIAGNOSIS — L989 Disorder of the skin and subcutaneous tissue, unspecified: Secondary | ICD-10-CM | POA: Diagnosis not present

## 2020-01-20 DIAGNOSIS — Z8249 Family history of ischemic heart disease and other diseases of the circulatory system: Secondary | ICD-10-CM | POA: Diagnosis not present

## 2020-01-20 DIAGNOSIS — Z66 Do not resuscitate: Secondary | ICD-10-CM | POA: Diagnosis present

## 2020-01-20 DIAGNOSIS — A419 Sepsis, unspecified organism: Secondary | ICD-10-CM | POA: Diagnosis not present

## 2020-01-20 DIAGNOSIS — R531 Weakness: Secondary | ICD-10-CM | POA: Diagnosis not present

## 2020-01-20 DIAGNOSIS — S3289XA Fracture of other parts of pelvis, initial encounter for closed fracture: Secondary | ICD-10-CM | POA: Diagnosis not present

## 2020-01-20 DIAGNOSIS — I959 Hypotension, unspecified: Secondary | ICD-10-CM | POA: Diagnosis not present

## 2020-01-20 DIAGNOSIS — J440 Chronic obstructive pulmonary disease with acute lower respiratory infection: Secondary | ICD-10-CM | POA: Diagnosis not present

## 2020-01-20 DIAGNOSIS — N390 Urinary tract infection, site not specified: Secondary | ICD-10-CM | POA: Diagnosis not present

## 2020-01-20 DIAGNOSIS — M549 Dorsalgia, unspecified: Secondary | ICD-10-CM | POA: Diagnosis present

## 2020-01-20 DIAGNOSIS — B961 Klebsiella pneumoniae [K. pneumoniae] as the cause of diseases classified elsewhere: Secondary | ICD-10-CM | POA: Diagnosis not present

## 2020-01-20 DIAGNOSIS — N319 Neuromuscular dysfunction of bladder, unspecified: Secondary | ICD-10-CM | POA: Diagnosis not present

## 2020-01-20 DIAGNOSIS — R338 Other retention of urine: Secondary | ICD-10-CM | POA: Diagnosis not present

## 2020-01-20 DIAGNOSIS — B004 Herpesviral encephalitis: Secondary | ICD-10-CM | POA: Diagnosis not present

## 2020-01-20 DIAGNOSIS — J209 Acute bronchitis, unspecified: Secondary | ICD-10-CM | POA: Diagnosis not present

## 2020-01-20 DIAGNOSIS — Z1612 Extended spectrum beta lactamase (ESBL) resistance: Secondary | ICD-10-CM | POA: Diagnosis not present

## 2020-01-20 DIAGNOSIS — R000 Tachycardia, unspecified: Secondary | ICD-10-CM | POA: Diagnosis not present

## 2020-01-20 DIAGNOSIS — A498 Other bacterial infections of unspecified site: Secondary | ICD-10-CM

## 2020-01-20 DIAGNOSIS — E118 Type 2 diabetes mellitus with unspecified complications: Secondary | ICD-10-CM | POA: Diagnosis not present

## 2020-01-20 LAB — COMPREHENSIVE METABOLIC PANEL
ALT: 19 U/L (ref 0–44)
AST: 37 U/L (ref 15–41)
Albumin: 3.2 g/dL — ABNORMAL LOW (ref 3.5–5.0)
Alkaline Phosphatase: 138 U/L — ABNORMAL HIGH (ref 38–126)
Anion gap: 14 (ref 5–15)
BUN: 13 mg/dL (ref 8–23)
CO2: 22 mmol/L (ref 22–32)
Calcium: 8.4 mg/dL — ABNORMAL LOW (ref 8.9–10.3)
Chloride: 106 mmol/L (ref 98–111)
Creatinine, Ser: 1.21 mg/dL — ABNORMAL HIGH (ref 0.44–1.00)
GFR calc Af Amer: 52 mL/min — ABNORMAL LOW (ref >60)
GFR calc non Af Amer: 45 mL/min — ABNORMAL LOW (ref >60)
Glucose, Bld: 169 mg/dL — ABNORMAL HIGH (ref 70–99)
Potassium: 3.7 mmol/L (ref 3.5–5.1)
Sodium: 142 mmol/L (ref 135–145)
Total Bilirubin: 0.6 mg/dL (ref 0.3–1.2)
Total Protein: 6.6 g/dL (ref 6.5–8.1)

## 2020-01-20 LAB — CBC WITH DIFFERENTIAL/PLATELET
Abs Immature Granulocytes: 0.09 10*3/uL — ABNORMAL HIGH (ref 0.00–0.07)
Basophils Absolute: 0 10*3/uL (ref 0.0–0.1)
Basophils Relative: 0 %
Eosinophils Absolute: 0 10*3/uL (ref 0.0–0.5)
Eosinophils Relative: 0 %
HCT: 39.8 % (ref 36.0–46.0)
Hemoglobin: 12.4 g/dL (ref 12.0–15.0)
Immature Granulocytes: 1 %
Lymphocytes Relative: 10 %
Lymphs Abs: 1.9 10*3/uL (ref 0.7–4.0)
MCH: 30.7 pg (ref 26.0–34.0)
MCHC: 31.2 g/dL (ref 30.0–36.0)
MCV: 98.5 fL (ref 80.0–100.0)
Monocytes Absolute: 0.5 10*3/uL (ref 0.1–1.0)
Monocytes Relative: 3 %
Neutro Abs: 15.3 10*3/uL — ABNORMAL HIGH (ref 1.7–7.7)
Neutrophils Relative %: 86 %
Platelets: 243 10*3/uL (ref 150–400)
RBC: 4.04 MIL/uL (ref 3.87–5.11)
RDW: 13.6 % (ref 11.5–15.5)
WBC: 17.8 10*3/uL — ABNORMAL HIGH (ref 4.0–10.5)
nRBC: 0 % (ref 0.0–0.2)

## 2020-01-20 LAB — URINALYSIS, ROUTINE W REFLEX MICROSCOPIC
Bilirubin Urine: NEGATIVE
Glucose, UA: NEGATIVE mg/dL
Hgb urine dipstick: NEGATIVE
Ketones, ur: NEGATIVE mg/dL
Nitrite: NEGATIVE
Protein, ur: 30 mg/dL — AB
Specific Gravity, Urine: 1.012 (ref 1.005–1.030)
WBC, UA: >50 WBC/hpf — ABNORMAL HIGH (ref 0–5)
pH: 6 (ref 5.0–8.0)

## 2020-01-20 LAB — POCT I-STAT EG7
Acid-Base Excess: 1 mmol/L (ref 0.0–2.0)
Bicarbonate: 25 mmol/L (ref 20.0–28.0)
Calcium, Ion: 1.09 mmol/L — ABNORMAL LOW (ref 1.15–1.40)
HCT: 38 % (ref 36.0–46.0)
Hemoglobin: 12.9 g/dL (ref 12.0–15.0)
O2 Saturation: 98 %
Potassium: 3.5 mmol/L (ref 3.5–5.1)
Sodium: 141 mmol/L (ref 135–145)
TCO2: 26 mmol/L (ref 22–32)
pCO2, Ven: 38.3 mmHg — ABNORMAL LOW (ref 44.0–60.0)
pH, Ven: 7.423 (ref 7.250–7.430)
pO2, Ven: 111 mmHg — ABNORMAL HIGH (ref 32.0–45.0)

## 2020-01-20 LAB — PROTIME-INR
INR: 1.1 (ref 0.8–1.2)
Prothrombin Time: 14 seconds (ref 11.4–15.2)

## 2020-01-20 LAB — SARS CORONAVIRUS 2 BY RT PCR (HOSPITAL ORDER, PERFORMED IN ~~LOC~~ HOSPITAL LAB): SARS Coronavirus 2: NEGATIVE

## 2020-01-20 LAB — LACTIC ACID, PLASMA: Lactic Acid, Venous: 1.8 mmol/L (ref 0.5–1.9)

## 2020-01-20 LAB — APTT: aPTT: 33 seconds (ref 24–36)

## 2020-01-20 MED ORDER — HYDROCODONE-ACETAMINOPHEN 10-325 MG PO TABS
1.0000 | ORAL_TABLET | Freq: Four times a day (QID) | ORAL | Status: DC | PRN
  Administered 2020-01-21 – 2020-01-23 (×4): 1 via ORAL
  Filled 2020-01-20 (×4): qty 1

## 2020-01-20 MED ORDER — VANCOMYCIN HCL 1500 MG/300ML IV SOLN
1500.0000 mg | Freq: Once | INTRAVENOUS | Status: AC
  Administered 2020-01-20: 1500 mg via INTRAVENOUS
  Filled 2020-01-20: qty 300

## 2020-01-20 MED ORDER — PREGABALIN 100 MG PO CAPS
100.0000 mg | ORAL_CAPSULE | Freq: Every day | ORAL | Status: DC
  Administered 2020-01-20 – 2020-01-23 (×4): 100 mg via ORAL
  Filled 2020-01-20 (×4): qty 1

## 2020-01-20 MED ORDER — DIPHENOXYLATE-ATROPINE 2.5-0.025 MG PO TABS
1.0000 | ORAL_TABLET | Freq: Four times a day (QID) | ORAL | Status: DC | PRN
  Administered 2020-01-23: 1 via ORAL
  Filled 2020-01-20 (×2): qty 1

## 2020-01-20 MED ORDER — IPRATROPIUM-ALBUTEROL 0.5-2.5 (3) MG/3ML IN SOLN
3.0000 mL | RESPIRATORY_TRACT | Status: DC | PRN
  Administered 2020-01-21: 3 mL via RESPIRATORY_TRACT
  Filled 2020-01-20: qty 3

## 2020-01-20 MED ORDER — POLYETHYLENE GLYCOL 3350 17 G PO PACK: 17.0000 g | PACK | Freq: Every day | ORAL | Status: DC | PRN

## 2020-01-20 MED ORDER — HEPARIN SODIUM (PORCINE) 5000 UNIT/ML IJ SOLN
5000.0000 [IU] | Freq: Two times a day (BID) | INTRAMUSCULAR | Status: DC
  Administered 2020-01-20 – 2020-01-23 (×7): 5000 [IU] via SUBCUTANEOUS
  Filled 2020-01-20 (×8): qty 1

## 2020-01-20 MED ORDER — ONDANSETRON HCL 4 MG PO TABS: 4.0000 mg | ORAL_TABLET | Freq: Four times a day (QID) | ORAL | Status: DC | PRN

## 2020-01-20 MED ORDER — ONDANSETRON HCL 4 MG/2ML IJ SOLN: 4.0000 mg | Freq: Four times a day (QID) | INTRAMUSCULAR | Status: DC | PRN

## 2020-01-20 MED ORDER — SODIUM CHLORIDE 0.9 % IV SOLN
2.0000 g | Freq: Once | INTRAVENOUS | Status: AC
  Administered 2020-01-20: 2 g via INTRAVENOUS
  Filled 2020-01-20: qty 2

## 2020-01-20 MED ORDER — DICLOFENAC SODIUM 1 % EX GEL
1.0000 "application " | Freq: Every day | CUTANEOUS | Status: DC | PRN
  Filled 2020-01-20: qty 100

## 2020-01-20 MED ORDER — LACTATED RINGERS IV BOLUS (SEPSIS)
250.0000 mL | Freq: Once | INTRAVENOUS | Status: AC
  Administered 2020-01-20: 250 mL via INTRAVENOUS

## 2020-01-20 MED ORDER — VANCOMYCIN HCL IN DEXTROSE 1-5 GM/200ML-% IV SOLN
1000.0000 mg | Freq: Once | INTRAVENOUS | Status: DC
  Filled 2020-01-20: qty 200

## 2020-01-20 MED ORDER — VITAMIN B-12 100 MCG PO TABS
100.0000 ug | ORAL_TABLET | Freq: Every day | ORAL | Status: DC
  Administered 2020-01-21 – 2020-01-24 (×4): 100 ug via ORAL
  Filled 2020-01-20 (×5): qty 1

## 2020-01-20 MED ORDER — SODIUM CHLORIDE 0.9 % IV SOLN: INTRAVENOUS | Status: AC

## 2020-01-20 MED ORDER — METRONIDAZOLE IN NACL 5-0.79 MG/ML-% IV SOLN
500.0000 mg | Freq: Once | INTRAVENOUS | Status: AC
  Administered 2020-01-20: 500 mg via INTRAVENOUS
  Filled 2020-01-20: qty 100

## 2020-01-20 MED ORDER — DIAZEPAM 2 MG PO TABS: 2.0000 mg | ORAL_TABLET | Freq: Two times a day (BID) | ORAL | Status: DC | PRN

## 2020-01-20 MED ORDER — BUDESONIDE 0.25 MG/2ML IN SUSP
0.2500 mg | Freq: Two times a day (BID) | RESPIRATORY_TRACT | Status: DC
  Administered 2020-01-21 – 2020-01-24 (×7): 0.25 mg via RESPIRATORY_TRACT
  Filled 2020-01-20 (×9): qty 2

## 2020-01-20 MED ORDER — ACYCLOVIR 400 MG PO TABS
400.0000 mg | ORAL_TABLET | Freq: Two times a day (BID) | ORAL | Status: DC
  Administered 2020-01-20 – 2020-01-24 (×8): 400 mg via ORAL
  Filled 2020-01-20 (×11): qty 1

## 2020-01-20 MED ORDER — DULOXETINE HCL 30 MG PO CPEP
30.0000 mg | ORAL_CAPSULE | Freq: Every morning | ORAL | Status: DC
  Administered 2020-01-21 – 2020-01-24 (×4): 30 mg via ORAL
  Filled 2020-01-20 (×4): qty 1

## 2020-01-20 MED ORDER — QUETIAPINE FUMARATE 25 MG PO TABS
25.0000 mg | ORAL_TABLET | Freq: Every day | ORAL | Status: DC
  Administered 2020-01-20 – 2020-01-23 (×4): 25 mg via ORAL
  Filled 2020-01-20 (×5): qty 1

## 2020-01-20 MED ORDER — VANCOMYCIN HCL 750 MG/150ML IV SOLN: 750.0000 mg | Freq: Two times a day (BID) | INTRAVENOUS | Status: DC

## 2020-01-20 MED ORDER — PANTOPRAZOLE SODIUM 40 MG PO TBEC
40.0000 mg | DELAYED_RELEASE_TABLET | Freq: Every day | ORAL | Status: DC
  Administered 2020-01-20 – 2020-01-23 (×4): 40 mg via ORAL
  Filled 2020-01-20 (×4): qty 1

## 2020-01-20 MED ORDER — SODIUM CHLORIDE 0.9 % IV SOLN
2.0000 g | Freq: Two times a day (BID) | INTRAVENOUS | Status: DC
  Administered 2020-01-20 – 2020-01-22 (×4): 2 g via INTRAVENOUS
  Filled 2020-01-20 (×4): qty 2

## 2020-01-20 MED ORDER — DULOXETINE HCL 60 MG PO CPEP: 60.0000 mg | ORAL_CAPSULE | Freq: Every day | ORAL | Status: DC

## 2020-01-20 MED ORDER — LACTATED RINGERS IV BOLUS (SEPSIS)
1000.0000 mL | Freq: Once | INTRAVENOUS | Status: AC
  Administered 2020-01-20: 1000 mL via INTRAVENOUS

## 2020-01-20 MED ORDER — ESTROGENS, CONJUGATED 0.625 MG/GM VA CREA
1.0000 | TOPICAL_CREAM | Freq: Every day | VAGINAL | Status: DC | PRN
  Administered 2020-01-23: 1 via VAGINAL
  Filled 2020-01-20: qty 30

## 2020-01-20 MED ORDER — OMEGA-3-ACID ETHYL ESTERS 1 G PO CAPS
1.0000 g | ORAL_CAPSULE | Freq: Every day | ORAL | Status: DC
  Administered 2020-01-21 – 2020-01-24 (×4): 1 g via ORAL
  Filled 2020-01-20 (×5): qty 1

## 2020-01-20 MED ORDER — DULOXETINE HCL 60 MG PO CPEP
60.0000 mg | ORAL_CAPSULE | Freq: Every day | ORAL | Status: DC
  Administered 2020-01-20 – 2020-01-23 (×4): 60 mg via ORAL
  Filled 2020-01-20 (×5): qty 1

## 2020-01-20 MED ORDER — HAIR/SKIN/NAILS/BIOTIN PO TABS: 1.0000 | ORAL_TABLET | Freq: Every day | ORAL | Status: DC

## 2020-01-20 NOTE — ED Triage Notes (Signed)
Pt bib ems from home sepsis alert. Dc from [facility] 1 week ago for rehab after fx her pelvix around easter. Pt on 3rd round of abx for UTI without resolve. Pt started macrobid today. Pt weak, febrile, falling asleep. Occasionally does not make sense when talking. Given 1g tylenol and 500cc NS en route.  100/52  HR 10 100.55F 95% 3L [place] (90% RA)

## 2020-01-20 NOTE — H&P (Signed)
History and Physical    Katelyn Lamb [id number] DOB: 1946/03/22 DOA: 01/20/2020  PCP: [name], MD   Patient coming from: Home   Chief Complaint: UTI  HPI: Katelyn Lamb is a 74 y.o. 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  Her left hip symptoms started about 1 week ago, with burning sensation, urinary frequency, and cloudy smelly urine.  She was treated with Cipro for 7 days which completed yesterday, however her urinary symptoms persisted.  Her PCP called her yesterday saying urine culture came back showing Cipro will not cover, and her PCP started her on Macrobid since yesterday which she took her first dose last night.  Overnight patient started to feel worsening of chills.  She started to spiking fever this morning of 102 F.  Patient's husband also noticed patient became more sleepy this morning, and somewhat confused. ED Course: UA WBC>50, large leukocytes, WBC 17.8, creatinine 1.2.  Hypertensive, received 30 cc/kg bolus and BP stabilized  Review of Systems: As per HPI otherwise 10 point review of systems negative.    Past Medical History:  Diagnosis Date  . Anxiety   . Back pain   . Gait abnormality 11/14/2016  . Memory difficulty 03/02/2019  . 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 ABDOMINAL HYSTERECTOMY    . BLADDER REPAIR    . CESAREAN SECTION    . IR KYPHO LUMBAR INC FX REDUCE BONE BX UNI/BIL CANNULATION INC/IMAGING  04/11/2018  . TUBAL LIGATION       reports that she has quit smoking. She has never used smokeless tobacco. She reports that she does not drink alcohol or use drugs.  Allergies  Allergen Reactions  .  Demerol [Meperidine] Other (See Comments)    Hallucinations  . Percocet [Oxycodone-Acetaminophen] Itching  . Amoxicillin-Pot Clavulanate Diarrhea    Severe pain, headache, intestinal infection  . Penicillins Itching and Rash    Has patient had a PCN reaction causing immediate rash, facial/tongue/throat swelling, SOB or lightheadedness with hypotension:  NO Has patient had a PCN reaction causing severe rash involving mucus membranes or skin necrosis: No Has patient had a PCN reaction that required hospitalization: No Has patient had a PCN reaction occurring within the last 10 years: Yes If all of the above answers are "NO", then may proceed with Cephalosporin use.    Family History  Problem Relation Age of Onset  . Hypertension Mother      Prior to Admission medications   Medication Sig Start Date End Date Taking? Authorizing Provider  acyclovir (ZOVIRAX) 400 MG tablet TAKE 1 TABLET BY MOUTH TWICE A DAY Patient taking differently: Take 400 mg by mouth 2 (two) times daily.  12/29/19  Yes [name], MD  Calcium Carb-Cholecalciferol (CALCIUM 600-D PO) Take 1 tablet by mouth in the morning and at bedtime.   Yes [provider]  conjugated estrogens (PREMARIN) vaginal cream Place 1 Applicatorful vaginally daily as needed (itching).    Yes [provider]  Cyanocobalamin (VITAMIN B-12 PO) Take 1 tablet by mouth daily.   Yes [provider]  diazepam (VALIUM) 5 MG tablet Take 5 mg by mouth 2 (two) times daily.   Yes [provider]  diclofenac Sodium (VOLTAREN) 1 % GEL Apply 1 application topically daily as needed (pain).   Yes [provider]  diphenoxylate-atropine (LOMOTIL) 2.5-0.025 MG tablet Take 1 tablet by mouth 4 (four) times daily as needed for diarrhea or loose stools.   Yes [provider]  DULoxetine (CYMBALTA) 30 MG capsule TAKE 1 CAPSULE BY MOUTH EVERY DAY Patient taking differently: Take 30 mg by mouth every morning.   09/07/19  Yes [name], MD  DULoxetine (CYMBALTA) 60 MG capsule Take 1 capsule (60 mg total) by mouth at bedtime. 12/14/19  Yes [name], MD  HYDROcodone-acetaminophen [facility]) 10-325 MG tablet Take 1 tablet by mouth every 6 (six) hours as needed for pain. 03/07/18  Yes [provider]  Multiple Vitamins-Minerals (HAIR/SKIN/NAILS/BIOTIN) TABS Take 1 tablet by mouth daily.   Yes [provider]  nitrofurantoin, macrocrystal-monohydrate, (MACROBID) 100 MG capsule Take 100 mg by mouth 2 (two) times daily. 01/19/20  Yes [provider]  omega-3 acid ethyl esters (LOVAZA) 1 g capsule Take 1 g by mouth daily.   Yes [provider]  pantoprazole (PROTONIX) 40 MG tablet Take 1 tablet (40 mg total) by mouth daily. Patient taking differently: Take 40 mg by mouth at bedtime.  10/26/16  Yes Angiulli, Lavon Paganini, PA-C  polyethylene glycol (MIRALAX / GLYCOLAX) packet Take 17 g by mouth daily. Patient taking differently: Take 17 g by mouth daily as needed (constipation).  10/26/16  Yes Angiulli, Lavon Paganini, PA-C  pregabalin (LYRICA) 50 MG capsule Take 100 mg by mouth at bedtime. 11/16/19  Yes [provider]  QUEtiapine (SEROQUEL) 25 MG tablet Take 1 tablet (25 mg total) by mouth at bedtime. 11/16/19  Yes [name], MD  ciprofloxacin (CIPRO) 500 MG tablet Take 500 mg by mouth 2 (two) times daily. 01/14/20   [provider]  DULoxetine (CYMBALTA) 30 MG capsule Take 1 capsule (30 mg total) by mouth every morning. 12/18/19 01/17/20  [place] Donnamarie Poag, DO  DULoxetine (CYMBALTA) 60 MG capsule Take 1 capsule (60 mg total) by mouth at bedtime. 12/17/19 01/16/20  [place] Donnamarie Poag, DO  ipratropium-albuterol (DUONEB) 0.5-2.5 (3) MG/3ML SOLN Take 3 mLs by nebulization every 4 (four) hours as needed (For SOB and wheezing). Patient not taking: Reported on 12/06/2019 07/25/19 08/24/19  Dana Allan I, MD  sulfamethoxazole-trimethoprim (BACTRIM) 400-80 MG tablet Take  0.5 tablets by mouth daily.    [provider]    Physical Exam: Vitals:   01/20/20 1245 01/20/20 1300 01/20/20 1315 01/20/20 1330  BP: 101/65 108/74 103/79 103/77  Pulse: 89 88 90 86  Resp: 19 16 16 16   Temp:      TempSrc:      SpO2: 95% 93% 91% 94%  Weight:      Height:        Constitutional: NAD, calm, comfortable Vitals:   01/20/20 1245 01/20/20 1300 01/20/20 1315 01/20/20 1330  BP: 101/65 108/74 103/79 103/77  Pulse: 89 88 90 86  Resp: 19 16 16 16   Temp:      TempSrc:      SpO2: 95% 93% 91% 94%  Weight:      Height:       Eyes: PERRL, lids and conjunctivae normal ENMT: Mucous membranes are dry. Posterior pharynx clear of any exudate or lesions.Normal dentition.  Neck: normal, supple, no masses, no thyromegaly Respiratory: clear to auscultation bilaterally, scattered wheezing, no crackles. Normal respiratory effort. No accessory muscle use.  Cardiovascular: Regular rate and  rhythm, no murmurs / rubs / gallops. No extremity edema. 2+ pedal pulses. No carotid bruits.  Abdomen: no tenderness, no masses palpated. No hepatosplenomegaly. Bowel sounds positive.  Musculoskeletal: no clubbing / cyanosis. No joint deformity upper and lower extremities. Good ROM, no contractures. Normal muscle tone.  Skin: no rashes, lesions, ulcers. No induration Neurologic: Lethargic, responds to voice, moving all limbs, following simple commands.  Psychiatric: Sleepy     Labs on Admission: I have personally reviewed following labs and imaging studies  CBC: Recent Labs  Lab 01/20/20 1049 01/20/20 1104  WBC 17.8*  --   NEUTROABS 15.3*  --   HGB 12.4 12.9  HCT 39.8 38.0  MCV 98.5  --   PLT 243  --    Basic Metabolic Panel: Recent Labs  Lab 01/20/20 1049 01/20/20 1104  NA 142 141  K 3.7 3.5  CL 106  --   CO2 22  --   GLUCOSE 169*  --   BUN 13  --   CREATININE 1.21*  --   CALCIUM 8.4*  --    GFR: Estimated Creatinine Clearance: 42.1 mL/min (A) (by C-G formula  based on SCr of 1.21 mg/dL (H)). Liver Function Tests: Recent Labs  Lab 01/20/20 1049  AST 37  ALT 19  ALKPHOS 138*  BILITOT 0.6  PROT 6.6  ALBUMIN 3.2*   No results for input(s): LIPASE, AMYLASE in the last 168 hours. No results for input(s): AMMONIA in the last 168 hours. Coagulation Profile: Recent Labs  Lab 01/20/20 1049  INR 1.1   Cardiac Enzymes: No results for input(s): CKTOTAL, CKMB, CKMBINDEX, TROPONINI in the last 168 hours. BNP (last 3 results) No results for input(s): PROBNP in the last 8760 hours. HbA1C: No results for input(s): HGBA1C in the last 72 hours. CBG: No results for input(s): GLUCAP in the last 168 hours. Lipid Profile: No results for input(s): CHOL, HDL, LDLCALC, TRIG, CHOLHDL, LDLDIRECT in the last 72 hours. Thyroid Function Tests: No results for input(s): TSH, T4TOTAL, FREET4, T3FREE, THYROIDAB in the last 72 hours. Anemia Panel: No results for input(s): VITAMINB12, FOLATE, FERRITIN, TIBC, IRON, RETICCTPCT in the last 72 hours. Urine analysis:    Component Value Date/Time   COLORURINE YELLOW 01/20/2020 1216   APPEARANCEUR CLOUDY (A) 01/20/2020 1216   LABSPEC 1.012 01/20/2020 1216   PHURINE 6.0 01/20/2020 1216   GLUCOSEU NEGATIVE 01/20/2020 1216   HGBUR NEGATIVE 01/20/2020 [place] 01/20/2020 [place] 01/20/2020 1216   PROTEINUR 30 (A) 01/20/2020 1216   NITRITE NEGATIVE 01/20/2020 1216   LEUKOCYTESUR LARGE (A) 01/20/2020 1216    Radiological Exams on Admission: DG Chest Port 1 View  Result Date: 01/20/2020 CLINICAL DATA:  Shortness of breath. Weakness, fever and somnolence. On antibiotics for urinary tract infection. EXAM: PORTABLE CHEST 1 VIEW COMPARISON:  Radiographs 12/06/2019 and 07/13/2019. CT 07/23/2019. FINDINGS: 1126 hours. The heart size and mediastinal contours are stable. There are lower lung volumes with increased streaky bibasilar pulmonary opacities, radiographically most consistent with  atelectasis. No consolidation, edema, pleural effusion or pneumothorax. The bones appear unchanged. Telemetry leads overlie the chest. IMPRESSION: Lower lung volumes with increased streaky bibasilar pulmonary opacities, radiographically consistent with atelectasis. No consolidation or pleural effusion. Electronically Signed   By: Richardean Sale M.D.   On: 01/20/2020 11:38    EKG: Independently reviewed. Prolonged QTC  Assessment/Plan Active Problems:   Sepsis ([place])  Sepsis -Evidenced by signs of end organ damage including change of mentation/acute encephalopathy, AKI,  elevated white count, borderline hypotension -No information about urine culture in epic or Care Everywhere, contact patient PCP [facility] physicians, left message.  For now we will extend coverage with cefepime, discontinue vancomycin and Flagyl. -Continue aggressive hydration for tonight  Neurogenic bladder/chronic urine retention/recurrent UTI -Long discussion with patient and husband, patient was diagnosed with urinary retention last year, and was on 30 days Foley, unfortunately developed massive UTI and Foley discontinued.  Urology at that point recommended self cath two times daily, in the morning and evening and document PVR.  Patient however unable to perform self cath due to her decreased hands and arm strength and coordination as a result of her encephalitis/meningitis.  Husband tried few times and felt he could not perform such duty either and then stopped.  From then on, patient had few UTI last year, and patient PCP started patient on Bactrim from this January.  But patient still developed 2-3 episodes of UTI this year despite Bactrim.  Husband denies any history of ESBL episode. -We will check PVR and renal ultrasound, discussed with patient and husband regarding the solution for self cath, which seems like the only option to prevent frequent UTI episode in the future.  Patient husband however expressed difficulty to perform  straight cath.  Wheezing -We will treat as acute bronchitis, recommend outpatient lung function test -Bronchodilators and Pulmicort  AKI -Likely from sepsis, received IV boluses and will continue aggressive maintenance fluid for today  History of HSV encephalitis in 2017 -Continue suppression acyclovir  Depression and anxiety -We will cut down her Valium -Continue SSRI  Prolonged QTC -Repeat EKG in a.m.  DVT prophylaxis: Heparin subcu Code Status: DNR but ok with intubation for short term Family Communication: Husband at bedside Disposition Plan: PT evaluation, we will need rehab Consults called: None Admission status: Progressive care, barriers to discharge is patient responded to antibiotics, and PT evaluation   Lequita Halt MD [facility] Pager [phone]    01/20/2020, 2:14 PM

## 2020-01-20 NOTE — ED Notes (Signed)
EDP aware of pt BP 

## 2020-01-20 NOTE — Progress Notes (Signed)
Pharmacy Antibiotic Note  Katelyn Lamb is a 74 y.o. female admitted on 01/20/2020 with sepsishx recent/recurrent UTI.  Pharmacy has been consulted for vancomycin and cefepime dosing.  Allergies noted, Pt has tolerated multiple cephalosporins in past.    Plan: Vancomycin 1500 mg IV x 1, then 750 mg IV every 12 hours (nomogram dosing d/t reagent shortage) Cefepime 2g IV every 12 hours Monitor renal function, Cx and clinical progression to narrow Vancomycin trough as needed   Height: 5\' 5"  (165.1 cm) Weight: 73 kg (160 lb 15 oz) IBW/kg (Calculated) : 57  Temp (24hrs), Avg:98.8 F (37.1 C), Min:98.8 F (37.1 C), Max:98.8 F (37.1 C)  Recent Labs  Lab 01/20/20 1049  WBC 17.8*    CrCl cannot be calculated (Patient's most recent lab result is older than the maximum 21 days allowed.).    Allergies  Allergen Reactions  . Demerol [Meperidine] Other (See Comments)    Hallucinations  . Percocet [Oxycodone-Acetaminophen] Itching  . Amoxicillin-Pot Clavulanate Diarrhea    Severe pain, headache, intestinal infection  . Penicillins Itching and Rash    Has patient had a PCN reaction causing immediate rash, facial/tongue/throat swelling, SOB or lightheadedness with hypotension:  NO Has patient had a PCN reaction causing severe rash involving mucus membranes or skin necrosis: No Has patient had a PCN reaction that required hospitalization: No Has patient had a PCN reaction occurring within the last 10 years: Yes If all of the above answers are "NO", then may proceed with Cephalosporin use.    Antimicrobials this admission: Vanco 5/19>> Cefepime 5/19>>  Dose adjustments this admission: n/a  Microbiology results: 5/19 BCx: sent 5/19 UCx: sent  Bertis Ruddy, PharmD Clinical Pharmacist ED Pharmacist Phone # [phone] 01/20/2020 11:35 AM

## 2020-01-20 NOTE — ED Notes (Signed)
famiily in

## 2020-01-20 NOTE — ED Provider Notes (Addendum)
[facility] EMERGENCY DEPARTMENT Provider Note   CSN: [id number] Arrival date & time: 01/20/20  1039     History Chief Complaint  Patient presents with  . Altered Mental Status    Katelyn Lamb is a 74 y.o. female.  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  Patient's husband gave significant amount of history and reports she has been dealing with a urinary tract infection now for the last few weeks.  She has now started on her third antibiotic.  Could not remember the initial antibiotic but then did a course of Cipro and then was changed to [place] yesterday.  He reports yesterday she seemed normal but when she woke up today she was confused, weak and had a fever.  Patient was found to be satting 90% on room air with tachycardia and normal blood pressure and temperature of 100.4 per EMS.  She was given a 500 mL bolus and 1000 mg of Tylenol.  Patient reports that she has had a cough but no sputum production and states it has been present since Easter.  She denies any nausea, vomiting, abdominal pain, chest pain or diarrhea.  She has had no recent falls but does report that she has pain in her right knee.  The history is provided by the patient, the EMS personnel and the spouse.  Altered Mental Status Presenting symptoms: confusion   Severity:  Moderate Most recent episode:  Yesterday Episode history:  Continuous Timing:  Constant Progression:  Unchanged Chronicity:  New Context: recent change in medication and recent illness   Associated symptoms: decreased appetite, fever and weakness   Associated symptoms: no abdominal pain, no difficulty breathing, no nausea and no vomiting   Associated symptoms comment:  Cough without sputum      Past Medical History:  Diagnosis Date  . Anxiety   . Back pain    . Gait abnormality 11/14/2016  . Memory difficulty 03/02/2019  . Myelitis due to herpes simplex [facility])     Patient Active Problem List   Diagnosis Date Noted  . Laceration of left hand 12/09/2019  . DNR (do not resuscitate) 12/09/2019  . Pelvic fracture ([place]) 12/06/2019  . Severe sepsis ([place]) 07/23/2019  . UTI (urinary tract infection) 07/23/2019  . Hematuria 07/23/2019  . Acute respiratory failure with hypoxia ([place]) 07/23/2019  . Hypokalemia 07/23/2019  . Acute on chronic anemia 07/23/2019  . Fever 07/23/2019  . Leukocytosis 07/23/2019  . Sepsis ([place]) 07/23/2019  . Generalized anxiety disorder 06/17/2019  . Menopausal sweats 06/17/2019  . Memory difficulty 03/02/2019  . Degenerative spondylolisthesis 09/02/2018  . Delirium 03/26/2018  . SIRS (systemic inflammatory response syndrome) ([place]) 03/26/2018  . 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 Degeneration of lumbar intervertebral disc 11/08/2017  . Lumbar radiculopathy 11/06/2017  . Chronic neck pain 09/12/2017  . Chronic low back pain 09/06/2017  . Chronic pain syndrome 09/06/2017  . Dilated pancreatic duct 01/24/2017  . DVT, lower extremity, distal, chronic ([place]) 01/22/2017  . Elevated serum GGT level 01/22/2017  . Medication monitoring encounter 01/08/2017  . Gait abnormality 11/14/2016  . Hypotension due to drugs   . Urinary retention   . Anxiety about health   . Reactive depression   . Ataxia   . Abdominal spasms   . Constipation  due to pain medication   . Acute lower UTI   . Dysuria   . Acute deep vein thrombosis (DVT) of popliteal vein of left lower extremity ([place])   . Incomplete paraplegia ([place])   . Acute blood loss anemia   . Neurogenic bladder   . Neuropathic pain   . Muscle spasm   . Gastroesophageal reflux disease   . Slow transit constipation   . Thrombocytopenia ([place]) 10/03/2016  . Abnormal MRI, spinal cord   . Encephalomyelitis   . Numbness   . Intractable back pain  09/20/2016  . Numbness of left lower extremity 09/20/2016  . Hyponatremia 09/20/2016  . Herpes zoster without complication 0000000  . Spondylosis of cervical region without myelopathy or radiculopathy 06/16/2015    Past Surgical History:  Procedure Laterality Date  . ABDOMINAL HYSTERECTOMY    . BLADDER REPAIR    . CESAREAN SECTION    . IR KYPHO LUMBAR INC FX REDUCE BONE BX UNI/BIL CANNULATION INC/IMAGING  04/11/2018  . TUBAL LIGATION       OB History   No obstetric history on file.     Family History  Problem Relation Age of Onset  . Hypertension Mother     Social History   Tobacco Use  . Smoking status: Former Research scientist (life sciences)  . Smokeless tobacco: Never Used  . Tobacco comment: 40 years ago   Substance Use Topics  . Alcohol use: No  . Drug use: No    Home Medications Prior to Admission medications   Medication Sig Start Date End Date Taking? Authorizing Provider  acyclovir (ZOVIRAX) 400 MG tablet TAKE 1 TABLET BY MOUTH TWICE A DAY 12/29/19   [name], MD  Calcium Carb-Cholecalciferol (CALCIUM 600-D PO) Take 1 tablet by mouth in the morning and at bedtime.    [provider]  conjugated estrogens (PREMARIN) vaginal cream Place 1 Applicatorful vaginally daily as needed (itching).     [provider]  Cyanocobalamin (VITAMIN B-12 PO) Take 1 tablet by mouth daily.    [provider]  diclofenac Sodium (VOLTAREN) 1 % GEL Apply 1 application topically daily as needed (pain).    [provider]  DULoxetine (CYMBALTA) 30 MG capsule TAKE 1 CAPSULE BY MOUTH EVERY DAY Patient taking differently: Take 30 mg by mouth every morning.  09/07/19   [name], MD  DULoxetine (CYMBALTA) 30 MG capsule Take 1 capsule (30 mg total) by mouth every morning. 12/18/19 01/17/20  [place] Donnamarie Poag, DO  DULoxetine (CYMBALTA) 60 MG capsule Take 1 capsule (60 mg total) by mouth at bedtime. 12/14/19   [name], MD  DULoxetine (CYMBALTA) 60 MG capsule  Take 1 capsule (60 mg total) by mouth at bedtime. 12/17/19 01/16/20  [place] Donnamarie Poag, DO  ipratropium-albuterol (DUONEB) 0.5-2.5 (3) MG/3ML SOLN Take 3 mLs by nebulization every 4 (four) hours as needed (For SOB and wheezing). Patient not taking: Reported on 12/06/2019 07/25/19 08/24/19  Dana Allan I, MD  Multiple Vitamins-Minerals (HAIR/SKIN/NAILS/BIOTIN) TABS Take 1 tablet by mouth daily.    [provider]  omega-3 acid ethyl esters (LOVAZA) 1 g capsule Take 1 g by mouth daily.    [provider]  pantoprazole (PROTONIX) 40 MG tablet Take 1 tablet (40 mg total) by mouth daily. Patient taking differently: Take 40 mg by mouth at bedtime.  10/26/16   Angiulli, Lavon Paganini, PA-C  polyethylene glycol (MIRALAX / GLYCOLAX) packet Take 17 g by mouth daily. Patient taking differently: Take 17 g by  mouth daily as needed (constipation).  10/26/16   Angiulli, Lavon Paganini, PA-C  QUEtiapine (SEROQUEL) 25 MG tablet Take 1 tablet (25 mg total) by mouth at bedtime. 11/16/19   [name], MD  sulfamethoxazole-trimethoprim (BACTRIM) 400-80 MG tablet Take 0.5 tablets by mouth daily.    [provider]    Allergies    Demerol [meperidine], Percocet [oxycodone-acetaminophen], Amoxicillin-pot clavulanate, and Penicillins  Review of Systems   Review of Systems  Constitutional: Positive for decreased appetite and fever.  Gastrointestinal: Negative for abdominal pain, nausea and vomiting.  Neurological: Positive for weakness.  Psychiatric/Behavioral: Positive for confusion.  All other systems reviewed and are negative.   Physical Exam Updated Vital Signs BP (!) 78/59   Pulse (!) 101   Temp 98.8 F (37.1 C) (Oral)   Resp 17   Ht 5\' 5"  (1.651 m)   Wt 73 kg   SpO2 91%   BMI 26.78 kg/m   Physical Exam Vitals and nursing note reviewed.  Constitutional:      General: She is not in acute distress.    Appearance: She is well-developed and normal weight. She is not diaphoretic.       Comments: Warm and clammy skin  HENT:     Head: Normocephalic and atraumatic.     Mouth/Throat:     Mouth: Mucous membranes are dry.  Eyes:     Pupils: Pupils are equal, round, and reactive to light.  Cardiovascular:     Rate and Rhythm: Normal rate and regular rhythm.     Heart sounds: Normal heart sounds. No murmur. No friction rub.  Pulmonary:     Effort: Pulmonary effort is normal.     Breath sounds: Normal air entry. Examination of the right-middle field reveals rales. Examination of the left-middle field reveals rales. Examination of the right-lower field reveals rales. Examination of the left-lower field reveals rales. Rhonchi and rales present. No wheezing.  Abdominal:     General: Bowel sounds are normal. There is no distension.     Palpations: Abdomen is soft.     Tenderness: There is no abdominal tenderness. There is no guarding or rebound.  Musculoskeletal:        General: No tenderness. Normal range of motion.     Right knee: Normal. No swelling, effusion or bony tenderness. Normal range of motion. No tenderness.     Right lower leg: No edema.     Left lower leg: No edema.     Comments: No edema  Skin:    General: Skin is warm.     Capillary Refill: Capillary refill takes 2 to 3 seconds.     Findings: No rash.  Neurological:     Mental Status: She is alert.     Cranial Nerves: No cranial nerve deficit.     Comments: Oriented to person and place.  Answering questions appropriately but does have delayed response.  Moving arms and legs without difficulty  Psychiatric:     Comments: Calm and cooperative and able to answer most questions.  Does not appear to be responding to internal stimuli at this time.     ED Results / Procedures / Treatments   Labs (all labs ordered are listed, but only abnormal results are displayed) Labs Reviewed  COMPREHENSIVE METABOLIC PANEL - Abnormal; Notable for the following components:      Result Value   Glucose, Bld 169 (*)     Creatinine, Ser 1.21 (*)    Calcium 8.4 (*)  Albumin 3.2 (*)    Alkaline Phosphatase 138 (*)    GFR calc non Af Amer 45 (*)    GFR calc Af Amer 52 (*)    All other components within normal limits  CBC WITH DIFFERENTIAL/PLATELET - Abnormal; Notable for the following components:   WBC 17.8 (*)    Neutro Abs 15.3 (*)    Abs Immature Granulocytes 0.09 (*)    All other components within normal limits  URINALYSIS, ROUTINE W REFLEX MICROSCOPIC - Abnormal; Notable for the following components:   APPearance CLOUDY (*)    Protein, ur 30 (*)    Leukocytes,Ua LARGE (*)    WBC, UA >50 (*)    Bacteria, UA MANY (*)    All other components within normal limits  POCT I-STAT EG7 - Abnormal; Notable for the following components:   pCO2, Ven 38.3 (*)    pO2, Ven 111.0 (*)    Calcium, Ion 1.09 (*)    All other components within normal limits  CULTURE, BLOOD (ROUTINE X 2)  CULTURE, BLOOD (ROUTINE X 2)  URINE CULTURE  SARS CORONAVIRUS 2 BY RT PCR (HOSPITAL ORDER, [facility])  LACTIC ACID, PLASMA  APTT  PROTIME-INR  LACTIC ACID, PLASMA  I-STAT VENOUS BLOOD GAS, ED    EKG EKG Interpretation  Date/Time:  Wednesday Jan 20 2020 10:54:23 EDT Ventricular Rate:  108 PR Interval:    QRS Duration: 84 QT Interval:  385 QTC Calculation: 517 R Axis:   -92 Text Interpretation: Sinus tachycardia Left anterior fascicular block Borderline low voltage, extremity leads Abnormal R-wave progression, late transition Prolonged QT interval No significant change since last tracing Confirmed by Blanchie Dessert [id number]) on 01/20/2020 11:05:47 AM   Radiology DG Chest Port 1 View  Result Date: 01/20/2020 CLINICAL DATA:  Shortness of breath. Weakness, fever and somnolence. On antibiotics for urinary tract infection. EXAM: PORTABLE CHEST 1 VIEW COMPARISON:  Radiographs 12/06/2019 and 07/13/2019. CT 07/23/2019. FINDINGS: 1126 hours. The heart size and mediastinal contours are stable. There  are lower lung volumes with increased streaky bibasilar pulmonary opacities, radiographically most consistent with atelectasis. No consolidation, edema, pleural effusion or pneumothorax. The bones appear unchanged. Telemetry leads overlie the chest. IMPRESSION: Lower lung volumes with increased streaky bibasilar pulmonary opacities, radiographically consistent with atelectasis. No consolidation or pleural effusion. Electronically Signed   By: Richardean Sale M.D.   On: 01/20/2020 11:38    Procedures Procedures (including critical care time)  Medications Ordered in ED Medications  lactated ringers bolus 1,000 mL (has no administration in time range)    ED Course  I have reviewed the triage vital signs and the nursing notes.  Pertinent labs & imaging results that were available during my care of the patient were reviewed by me and considered in my medical decision making (see chart for details).    MDM Rules/Calculators/A&P                      Elderly female presenting today with tachycardia, fever, mild hypoxia and a code sepsis was initiated.  It was reported that patient has had recurrent antibiotics for UTI there is concern for UTI versus pneumonia.  Patient has complained of a dry cough and sats were 90% on room air with rales and rhonchi present in the bases bilaterally.  She has no evidence of fluid overload at this time and no prior heart history.  No evidence of cellulitis, septic joint today.  She has no abdominal  pain consistent with appendicitis, perforation, diverticulitis.  Patient given 1 L of IV fluids until lactate returns.  She is already received Tylenol.  11:31 AM On reevaluation patient is hypotensive in the [id number] systolic with lower maps in the 50s.  Chest x-ray at bedside did not show significant signs of pneumonia.  Patient covered with broad-spectrum antibiotics.  Will give 30/kg of fluid which is 2249mL.  Will reevaluate after initial fluid bolus to see if patient  requires pressor support.  Patient continues to [facility] about the same.  VBG with normal pH and CO2.  CBC with leukocytosis of 17,000 and stable hemoglobin.  11:56 AM CMP with mild AKI with a creatinine of 1.21 from baseline of 0.8 with normal anion gap, lactic acid and coags within normal limits and chest x-ray with atelectasis but no area of consolidation.  After 1 L bolus patient's blood pressure improved to 111/81. UA consistent with UTI.  Urine cultures pending.  BP remains stable on fluids.   Admit for further care.  MDM Number of Diagnoses or Management Options   Amount and/or Complexity of Data Reviewed Clinical lab tests: ordered and reviewed Tests in the radiology section of CPT: ordered and reviewed Tests in the medicine section of CPT: ordered and reviewed Decide to obtain previous medical records or to obtain history from someone other than the patient: yes Obtain history from someone other than the patient: yes Review and summarize past medical records: yes Discuss the patient with other providers: yes Independent visualization of images, tracings, or specimens: yes  Risk of Complications, Morbidity, and/or Mortality Presenting problems: high Diagnostic procedures: low Management options: moderate  Critical Care Total time providing critical care: 30-74 minutes  Patient Progress Patient progress: improved  CRITICAL CARE Performed by: Devarius Nelles Total critical care time: 40 minutes Critical care time was exclusive of separately billable procedures and treating other patients. Critical care was necessary to treat or prevent imminent or life-threatening deterioration. Critical care was time spent personally by me on the following activities: development of treatment plan with patient and/or surrogate as well as nursing, discussions with consultants, evaluation of patient's response to treatment, examination of patient, obtaining history from patient or  surrogate, ordering and performing treatments and interventions, ordering and review of laboratory studies, ordering and review of radiographic studies, pulse oximetry and re-evaluation of patient's condition.  Final Clinical Impression(s) / ED Diagnoses Final diagnoses:  Sepsis with acute renal failure and septic shock, due to unspecified organism, unspecified acute renal failure type [facility])    Rx / DC Orders ED Discharge Orders    None       Blanchie Dessert, MD 01/20/20 1301    Blanchie Dessert, MD 01/20/20 1315

## 2020-01-21 ENCOUNTER — Encounter (HOSPITAL_COMMUNITY): Payer: Self-pay | Admitting: Internal Medicine

## 2020-01-21 DIAGNOSIS — B9689 Other specified bacterial agents as the cause of diseases classified elsewhere: Secondary | ICD-10-CM

## 2020-01-21 DIAGNOSIS — F419 Anxiety disorder, unspecified: Secondary | ICD-10-CM

## 2020-01-21 DIAGNOSIS — R338 Other retention of urine: Secondary | ICD-10-CM

## 2020-01-21 DIAGNOSIS — N390 Urinary tract infection, site not specified: Secondary | ICD-10-CM

## 2020-01-21 LAB — CBC
HCT: 34.6 % — ABNORMAL LOW (ref 36.0–46.0)
Hemoglobin: 10.5 g/dL — ABNORMAL LOW (ref 12.0–15.0)
MCH: 30.6 pg (ref 26.0–34.0)
MCHC: 30.3 g/dL (ref 30.0–36.0)
MCV: 100.9 fL — ABNORMAL HIGH (ref 80.0–100.0)
Platelets: 173 10*3/uL (ref 150–400)
RBC: 3.43 MIL/uL — ABNORMAL LOW (ref 3.87–5.11)
RDW: 13.7 % (ref 11.5–15.5)
WBC: 15.4 10*3/uL — ABNORMAL HIGH (ref 4.0–10.5)
nRBC: 0 % (ref 0.0–0.2)

## 2020-01-21 LAB — BASIC METABOLIC PANEL
Anion gap: 9 (ref 5–15)
BUN: 11 mg/dL (ref 8–23)
CO2: 24 mmol/L (ref 22–32)
Calcium: 8.2 mg/dL — ABNORMAL LOW (ref 8.9–10.3)
Chloride: 108 mmol/L (ref 98–111)
Creatinine, Ser: 0.9 mg/dL (ref 0.44–1.00)
GFR calc Af Amer: >60 mL/min (ref >60)
GFR calc non Af Amer: >60 mL/min (ref >60)
Glucose, Bld: 112 mg/dL — ABNORMAL HIGH (ref 70–99)
Potassium: 3.2 mmol/L — ABNORMAL LOW (ref 3.5–5.1)
Sodium: 141 mmol/L (ref 135–145)

## 2020-01-21 MED ORDER — POTASSIUM CHLORIDE 10 MEQ/100ML IV SOLN
10.0000 meq | INTRAVENOUS | Status: AC
  Administered 2020-01-21: 10 meq via INTRAVENOUS
  Filled 2020-01-21: qty 100

## 2020-01-21 MED ORDER — POTASSIUM CHLORIDE 10 MEQ/100ML IV SOLN: 10.0000 meq | INTRAVENOUS | Status: DC

## 2020-01-21 MED ORDER — POTASSIUM CHLORIDE CRYS ER 20 MEQ PO TBCR
20.0000 meq | EXTENDED_RELEASE_TABLET | Freq: Three times a day (TID) | ORAL | Status: AC
  Administered 2020-01-22 (×4): 20 meq via ORAL
  Filled 2020-01-21 (×4): qty 1

## 2020-01-21 NOTE — Progress Notes (Signed)
PROGRESS NOTE  Katelyn Lamb [id number] DOB: [birth year] DOA: 01/20/2020 PCP: [name], MD  Brief History   The patient is a 74 yr old woman who presented to [facility] from home with complaints of chills and fever of 102 worsening UTI symptoms including burning with micturition, urinary frequency, and foul smelling urine. She has also been lethargic and confused per the patient's spouse. The patient has had an ongoing issue with UTI due to urinary retention and the need for catheters. She has had to separate consults with urology which have ended badly and without any kind of resolution of her problem. She was been on and off of Cipro in the past 3 weeks. Her last dose of Cipro was the night of 01/19/2020.   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 She has had lumbar compression fractures s/p kyphoplasty.  Consultants  . None  Procedures  . None  Antibiotics   Anti-infectives (From admission, onward)   Start     Dose/Rate Route Frequency Ordered Stop   01/21/20 0100  vancomycin (VANCOREADY) IVPB 750 mg/150 mL  Status:  Discontinued     750 mg 150 mL/hr over 60 Minutes Intravenous Every 12 hours 01/20/20 1231 01/20/20 1439   01/20/20 2200  ceFEPIme (MAXIPIME) 2 g in sodium chloride 0.9 % 100 mL IVPB     2 g 200 mL/hr over 30 Minutes Intravenous Every 12 hours 01/20/20 1231     01/20/20 1400  acyclovir (ZOVIRAX) tablet 400 mg     400 mg Oral 2 times daily 01/20/20 1350     01/20/20 1145  ceFEPIme (MAXIPIME) 2 g in sodium chloride 0.9 % 100 mL IVPB     2 g 200 mL/hr over 30 Minutes Intravenous  Once 01/20/20 1130 01/20/20 1258   01/20/20 1145  metroNIDAZOLE (FLAGYL) IVPB 500 mg     500 mg 100 mL/hr over 60 Minutes Intravenous  Once 01/20/20 1130 01/20/20 1258   01/20/20 1145  vancomycin (VANCOCIN) IVPB 1000 mg/200 mL premix  Status:  Discontinued     1,000 mg 200 mL/hr  over 60 Minutes Intravenous  Once 01/20/20 1130 01/20/20 1136   01/20/20 1145  vancomycin (VANCOREADY) IVPB 1500 mg/300 mL     1,500 mg 150 mL/hr over 120 Minutes Intravenous  Once 01/20/20 1136 01/20/20 1542    .  Subjective  The patient is resting comfortably. No new complaints.  Objective   Vitals:  Vitals:   01/21/20 1214 01/21/20 1548  BP: (!) 79/53 (!) 88/59  Pulse: 90 97  Resp: 17 (!) 22  Temp: 98.1 F (36.7 C) 98.7 F (37.1 C)  SpO2: 97% 94%   Exam:  Constitutional:  . The patient is awake, alert, and oriented x 3. No acute distress. Respiratory:  . No increased work of breathing. . No wheezes, rales, or rhonchi . No tactile fremitus Cardiovascular:  . Regular rate and rhythm . No murmurs, ectopy, or gallups. . No lateral PMI. No thrills. Abdomen:  . Abdomen is soft, non-tender, non-distended . No hernias, masses, or organomegaly . Normoactive bowel sounds.  Musculoskeletal:  . No cyanosis, clubbing, or edema Skin:  . No rashes, lesions, ulcers . palpation of skin: no induration or nodules Neurologic:  . CN 2-12 intact . Sensation all 4 extremities intact Psychiatric:  . Mental status o Mood, affect appropriate o Orientation to person, place, time  . judgment  and insight appear intact  I have personally reviewed the following:   Today's Data  . Vitals, BMP, CBC  Micro Data  . Urine culture has grown out Klebsiella pneumoniae.  Scheduled Meds: . acyclovir  400 mg Oral BID  . budesonide (PULMICORT) nebulizer solution  0.25 mg Nebulization BID  . DULoxetine  30 mg Oral q morning - 10a  . DULoxetine  60 mg Oral QHS  . heparin  5,000 Units Subcutaneous Q12H  . omega-3 acid ethyl esters  1 g Oral Daily  . pantoprazole  40 mg Oral QHS  . pregabalin  100 mg Oral QHS  . QUEtiapine  25 mg Oral QHS  . vitamin B-12  100 mcg Oral Daily   Continuous Infusions: . ceFEPime (MAXIPIME) IV Stopped (01/21/20 0935)    Active Problems:   Sepsis ([place])    AKI (acute kidney injury) ([place])   LOS: 1 day   A & P  Sepsis:Evidenced by signs of end organ damage including change of mentation/acute encephalopathy, AKI, elevated white count, borderline hypotension. No information about urine culture in epic or Care Everywhere, contact patient PCP [facility] physicians, left message. For now we will extend coverage with cefepime, discontinue vancomycin and Flagyl. Continue aggressive hydration for tonight.  Neurogenic bladder/chronic urine retention/recurrent UTI: Long discussion with patient and husband, patient was diagnosed with urinary retention last year, and was on 30 days Foley, unfortunately developed massive UTI and Foley discontinued. Urology at that point recommended self cath two times daily, in the morning and evening and document PVR. Patient however unable to perform self cath due to her decreased hands and arm strength and coordination as a result of her encephalitis/meningitis.  Husband tried few times and felt he could not perform such duty either and then stopped.  From then on, patient had few UTI last year, and patient PCP started patient on Bactrim from this January.  But patient still developed 2-3 episodes of UTI this year despite Bactrim.  Husband denies any history of ESBL episode. Renal ultrasound demonstrated no acute renal finding. There is some renal cortical atrophy evident.  We will check PVR as well. Will consult ID to help patient avoid further infection. The patient and her spouse have declined offer of a urology consult.   Wheezing: We will treat as acute bronchitis, recommend outpatient lung function test. Continue bronchodilators and pulmicort.  AKI: Likely from sepsis, received IV boluses and will continue aggressive maintenance fluid for today.  Hypotension: Hold antihypertensives. IV fluid bolus.  History of HSV encephalitis in 2017: Continue suppression acyclovir.  Depression and anxiety: We will cut down her Valium.  Continue SSRI.  Prolonged QTC: Repeat EKG.  I have seen and examined this patient myself. I have spent 35 minutes in her evaluation and care.  DVT prophylaxis: Heparin subcu Code Status: DNR but ok with intubation for short term Family Communication: Husband at bedside Disposition Plan: PT evaluation, we will need rehab  Ava Swayze, DO [facility] Direct contact: see [email or web address]  7PM-7AM contact night coverage as above 01/21/2020, 5:20 PM  LOS: 1 day

## 2020-01-21 NOTE — Progress Notes (Signed)
PROGRESS NOTE  Katelyn Lamb [id number] DOB: Apr 01, 1946 DOA: 01/20/2020 PCP: [name], MD  Brief History   The patient is a 74 yr old woman who presented to [facility] from home with complaints of chills and fever of 102 worsening UTI symptoms including burning with micturition, urinary frequency, and foul smelling urine. She has also been lethargic and confused per the patient's spouse. The patient has had an ongoing issue with UTI due to urinary retention and the need for catheters. She has had to separate consults with urology which have ended badly and without any kind of resolution of her problem. She was been on and off of Cipro in the past 3 weeks. Her last dose of Cipro was the night of 01/19/2020.   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 She has had lumbar compression fractures s/p kyphoplasty.  Consultants  . None  Procedures  . None  Antibiotics   Anti-infectives (From admission, onward)   Start     Dose/Rate Route Frequency Ordered Stop   01/21/20 0100  vancomycin (VANCOREADY) IVPB 750 mg/150 mL  Status:  Discontinued     750 mg 150 mL/hr over 60 Minutes Intravenous Every 12 hours 01/20/20 1231 01/20/20 1439   01/20/20 2200  ceFEPIme (MAXIPIME) 2 g in sodium chloride 0.9 % 100 mL IVPB     2 g 200 mL/hr over 30 Minutes Intravenous Every 12 hours 01/20/20 1231     01/20/20 1400  acyclovir (ZOVIRAX) tablet 400 mg     400 mg Oral 2 times daily 01/20/20 1350     01/20/20 1145  ceFEPIme (MAXIPIME) 2 g in sodium chloride 0.9 % 100 mL IVPB     2 g 200 mL/hr over 30 Minutes Intravenous  Once 01/20/20 1130 01/20/20 1258   01/20/20 1145  metroNIDAZOLE (FLAGYL) IVPB 500 mg     500 mg 100 mL/hr over 60 Minutes Intravenous  Once 01/20/20 1130 01/20/20 1258   01/20/20 1145  vancomycin (VANCOCIN) IVPB 1000 mg/200 mL premix  Status:  Discontinued     1,000 mg 200 mL/hr  over 60 Minutes Intravenous  Once 01/20/20 1130 01/20/20 1136   01/20/20 1145  vancomycin (VANCOREADY) IVPB 1500 mg/300 mL     1,500 mg 150 mL/hr over 120 Minutes Intravenous  Once 01/20/20 1136 01/20/20 1542    .  Subjective  The patient is resting comfortably. No new complaints.  Objective   Vitals:  Vitals:   01/21/20 1214 01/21/20 1548  BP: (!) 79/53 (!) 88/59  Pulse: 90 97  Resp: 17 (!) 22  Temp: 98.1 F (36.7 C) 98.7 F (37.1 C)  SpO2: 97% 94%   Exam:  Constitutional:  . The patient is awake, alert, and oriented x 3. No acute distress. Eyes:  . pupils and irises appear normal . Normal lids and conjunctivae ENMT:  . grossly normal hearing  . Lips appear normal . external ears, nose appear normal . Oropharynx: mucosa, tongue,posterior pharynx appear normal Neck:  . neck appears normal, no masses, normal ROM, supple . no thyromegaly Respiratory:  . No increased work of breathing. . No wheezes, rales, or rhonchi . No tactile fremitus Cardiovascular:  . Regular rate and rhythm . No murmurs, ectopy, or gallups. . No lateral PMI. No thrills. Abdomen:  . Abdomen is soft, non-tender, non-distended . No hernias, masses, or organomegaly . Normoactive bowel sounds.  Musculoskeletal:  .  No cyanosis, clubbing, or edema Skin:  . No rashes, lesions, ulcers . palpation of skin: no induration or nodules Neurologic:  . CN 2-12 intact . Sensation all 4 extremities intact Psychiatric:  . Mental status o Mood, affect appropriate o Orientation to person, place, time  . judgment and insight appear intact  I have personally reviewed the following:   Today's Data  . Vitals, BMP, CBC  Micro Data  . Urine culture has grown out Klebsiella pneumoniae  Scheduled Meds: . acyclovir  400 mg Oral BID  . budesonide (PULMICORT) nebulizer solution  0.25 mg Nebulization BID  . DULoxetine  30 mg Oral q morning - 10a  . DULoxetine  60 mg Oral QHS  . heparin  5,000 Units  Subcutaneous Q12H  . omega-3 acid ethyl esters  1 g Oral Daily  . pantoprazole  40 mg Oral QHS  . pregabalin  100 mg Oral QHS  . QUEtiapine  25 mg Oral QHS  . vitamin B-12  100 mcg Oral Daily   Continuous Infusions: . ceFEPime (MAXIPIME) IV Stopped (01/21/20 0935)    Active Problems:   Sepsis ([place])   AKI (acute kidney injury) ([place])   LOS: 1 day   A & P  Sepsis:Evidenced by signs of end organ damage including change of mentation/acute encephalopathy, AKI, elevated white count, borderline hypotension. No information about urine culture in epic or Care Everywhere, contact patient PCP [facility] physicians, left message. For now we will extend coverage with cefepime, discontinue vancomycin and Flagyl. Continue aggressive hydration for tonight.  Neurogenic bladder/chronic urine retention/recurrent UTI: Long discussion with patient and husband, patient was diagnosed with urinary retention last year, and was on 30 days Foley, unfortunately developed massive UTI and Foley discontinued. Urology at that point recommended self cath two times daily, in the morning and evening and document PVR. Patient however unable to perform self cath due to her decreased hands and arm strength and coordination as a result of her encephalitis/meningitis.  Husband tried few times and felt he could not perform such duty either and then stopped.  From then on, patient had few UTI last year, and patient PCP started patient on Bactrim from this January.  But patient still developed 2-3 episodes of UTI this year despite Bactrim.  Husband denies any history of ESBL episode. Renal ultrasound demonstrated no acute renal finding. There is some renal cortical atrophy evident.  We will check PVR as well. Will consult ID to help patient avoid further infection. The patient and her spouse have declined off of a urology consult.   Wheezing: We will treat as acute bronchitis, recommend outpatient lung function test. Continue bronchodilators  and pulmicort.  AKI: Likely from sepsis, received IV boluses and will continue aggressive maintenance fluid for today.  History of HSV encephalitis in 2017: Continue suppression acyclovir.  Depression and anxiety: We will cut down her Valium. Continue SSRI.  Prolonged QTC: Repeat EKG in a.m.  I have seen and examined this patient myself. I have spent 35 minutes in her evaluation and care.  DVT prophylaxis: Heparin subcu Code Status: DNR but ok with intubation for short term Family Communication: Husband at bedside Disposition Plan: PT evaluation, we will need rehab  Tennile Styles, DO [facility] Direct contact: see [email or web address]  7PM-7AM contact night coverage as above 01/21/2020, 5:20 PM  LOS: 1 day

## 2020-01-21 NOTE — Progress Notes (Signed)
   01/21/20 0340  Assess: MEWS Score  Temp 97.8 F (36.6 C)  BP 92/81  Pulse Rate 99  Resp (!) 23  SpO2 95 %  Assess: MEWS Score  MEWS Temp 0  MEWS Systolic 1  MEWS Pulse 0  MEWS RR 1  MEWS LOC 0  MEWS Score 2  MEWS Score Color Yellow  Assess: if the MEWS score is Yellow or Red  Were vital signs taken at a resting state? Yes  Focused Assessment Documented focused assessment  Early Detection of Sepsis Score *See Row Information* Low  MEWS guidelines implemented *See Row Information* Yes  Treat  MEWS Interventions Escalated (See documentation below)  Take Vital Signs  Increase Vital Sign Frequency  Yellow: Q 2hr X 2 then Q 4hr X 2, if remains yellow, continue Q 4hrs  Escalate  MEWS: Escalate Yellow: discuss with charge nurse/RN and consider discussing with provider and RRT  Notify: Charge Nurse/RN  Name of Charge Nurse/RN Notified Chanda Busing, RN  Date Charge Nurse/RN Notified 01/21/20  Time Charge Nurse/RN Notified [phone]  Document  Patient Outcome Stabilized after interventions  Progress note created (see row info) Yes   Elesa Hacker, RN

## 2020-01-21 NOTE — Evaluation (Signed)
Physical Therapy Evaluation Patient Details Name: Katelyn Lamb MRN: [id number] DOB: 1946-08-23 Today's Date: 01/21/2020   History of Present Illness  74 y.o. 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 Pt discharged to SNF [facility]) following previous hospitalization for pelvic fx. She had only been home x 1 week prior to current hospitalization.    Clinical Impression  Pt admitted with above diagnosis. On eval, pt required mod assist supine to sit, min assist sit to stand and min assist ambulation 76' with RW. Pt ambulated on RA with desat to 89%. SpO2 98-100% at rest on RA.  Pt presents with confusion and balance deficits, increasing her risk for falls. Pt currently with functional limitations due to the deficits listed below (see PT Problem List). Pt will benefit from skilled PT to increase their independence and safety with mobility to allow discharge to the venue listed below.       Follow Up Recommendations Home health PT;Supervision/Assistance - 24 hour    Equipment Recommendations  None recommended by PT    Recommendations for Other Services       Precautions / Restrictions Precautions Precautions: Fall;Other (comment) Precaution Comments: watch sats      Mobility  Bed Mobility Overal bed mobility: Needs Assistance Bed Mobility: Supine to Sit     Supine to sit: HOB elevated;Mod assist     General bed mobility comments: +rail, increased time, cues for sequencing  Transfers Overall transfer level: Needs assistance Equipment used: Rolling walker (2 wheeled) Transfers: Sit to/from Stand Sit to Stand: Min assist         General transfer comment: cues for hand placement, assist  to power up  Ambulation/Gait Ambulation/Gait assistance: Min assist;+2 safety/equipment Gait Distance (Feet): 75 Feet Assistive device: Rolling walker (2 wheeled) Gait Pattern/deviations: Step-through pattern;Decreased stride length Gait velocity: decreased Gait velocity interpretation: <1.31 ft/sec, indicative of household ambulator General Gait Details: Ambulated on RA with desat to 89%. Assist to steady balance. Cues to stay close to RW. Pt replying "what walker?" When pt directed to the walker in front of her, she continued to scan the environment around her, saying "oh, is that the walker over there?" Pt gripping RW but unable to get her to visually attend to it.  Stairs            Wheelchair Mobility    Modified Rankin (Stroke Patients Only)       Balance Overall balance assessment: Needs assistance Sitting-balance support: No upper extremity supported;Feet supported Sitting balance-Leahy Scale: Good     Standing balance support: Bilateral upper extremity supported;During functional activity Standing balance-Leahy Scale: Poor Standing balance comment: reliant on UE support                             Pertinent Vitals/Pain Pain Assessment: No/denies pain    Home Living Family/patient expects to be discharged to:: Private residence Living Arrangements: Spouse/significant other Available Help at Discharge: Available 24 hours/day;Family Type of Home: House Home Access: Stairs to enter Entrance Stairs-Rails: None Entrance Stairs-Number of Steps: 2 Home Layout: Two level;Able to live on main level with bedroom/bathroom Home Equipment: Gilford Rile - 2 wheels;Bedside commode;Wheelchair - manual;Tub bench Additional Comments: spouse is very supportive    Prior  Function Level of Independence: Needs assistance   Gait / Transfers Assistance Needed: ambulating with RW, occasional support from spouse  ADL's / Homemaking Assistance Needed: husband assists with  ADLs as needed        Hand Dominance   Dominant Hand: Right    Extremity/Trunk Assessment   Upper Extremity Assessment Upper Extremity Assessment: Generalized weakness    Lower Extremity Assessment Lower Extremity Assessment: Generalized weakness       Communication   Communication: No difficulties  Cognition Arousal/Alertness: Awake/alert Behavior During Therapy: WFL for tasks assessed/performed Overall Cognitive Status: Impaired/Different from baseline Area of Impairment: Orientation;Attention;Memory;Safety/judgement;Following commands;Problem solving;Awareness                 Orientation Level: Disoriented to;Place;Time Current Attention Level: Selective Memory: Decreased short-term memory Following Commands: Follows one step commands with increased time Safety/Judgement: Decreased awareness of safety;Decreased awareness of deficits Awareness: Emergent Problem Solving: Slow processing;Difficulty sequencing;Requires verbal cues General Comments: Pt was adamant that she came directly to the hospital from [place]. Husband arrived during eval and confirmed pt had been home x 1 week PTA. Even with his confirmation, pt still hesitant to believe she had been home.      General Comments General comments (skin integrity, edema, etc.): Pt on 3L O2 on arrival, SpO2 100%. O2 removed. SpO2 100% at rest on RA prior to mobility. Desat to 89% on RA after 75'. SpO2 98% in recliner on RA at end of session. BP 97/61 in recliner. RN aware of BP and pt being on RA.    Exercises     Assessment/Plan    PT Assessment Patient needs continued PT services  PT Problem List Decreased strength;Decreased mobility;Decreased knowledge of precautions;Decreased activity tolerance;Decreased balance       PT Treatment Interventions DME instruction;Therapeutic activities;Cognitive remediation;Gait training;Therapeutic exercise;Patient/family education;Balance training;Stair  training;Functional mobility training    PT Goals (Current goals can be found in the Care Plan section)  Acute Rehab PT Goals Patient Stated Goal: home PT Goal Formulation: With patient/family Time For Goal Achievement: 02/04/20 Potential to Achieve Goals: Good    Frequency Min 3X/week   Barriers to discharge        Co-evaluation               AM-PAC PT "6 Clicks" Mobility  Outcome Measure Help needed turning from your back to your side while in a flat bed without using bedrails?: A Little Help needed moving from lying on your back to sitting on the side of a flat bed without using bedrails?: A Little Help needed moving to and from a bed to a chair (including a wheelchair)?: A Little Help needed standing up from a chair using your arms (e.g., wheelchair or bedside chair)?: A Little Help needed to walk in hospital room?: A Little Help needed climbing 3-5 steps with a railing? : A Lot 6 Click Score: 17    End of Session Equipment Utilized During Treatment: Gait belt Activity Tolerance: Patient tolerated treatment well Patient left: in chair;with chair alarm set;with call bell/phone within reach;with family/visitor present Nurse Communication: Mobility status;Other (comment)(Pt on RA.) PT Visit Diagnosis: Difficulty in walking, not elsewhere classified (R26.2)    Time: [id number] PT Time Calculation (min) (ACUTE ONLY): 35 min   Charges:   PT Evaluation $PT Eval Moderate Complexity: 1 Mod PT Treatments $Gait Training: 8-22 mins        Lorrin Goodell, PT  Office # [phone] Pager [phone]   Lorriane Shire 01/21/2020, 12:37 PM

## 2020-01-22 DIAGNOSIS — B004 Herpesviral encephalitis: Secondary | ICD-10-CM

## 2020-01-22 DIAGNOSIS — R000 Tachycardia, unspecified: Secondary | ICD-10-CM

## 2020-01-22 DIAGNOSIS — D72829 Elevated white blood cell count, unspecified: Secondary | ICD-10-CM

## 2020-01-22 LAB — CBC WITH DIFFERENTIAL/PLATELET
Abs Immature Granulocytes: 0.04 10*3/uL (ref 0.00–0.07)
Basophils Absolute: 0 10*3/uL (ref 0.0–0.1)
Basophils Relative: 0 %
Eosinophils Absolute: 0.5 10*3/uL (ref 0.0–0.5)
Eosinophils Relative: 7 %
HCT: 32 % — ABNORMAL LOW (ref 36.0–46.0)
Hemoglobin: 10 g/dL — ABNORMAL LOW (ref 12.0–15.0)
Immature Granulocytes: 1 %
Lymphocytes Relative: 22 %
Lymphs Abs: 1.8 10*3/uL (ref 0.7–4.0)
MCH: 31 pg (ref 26.0–34.0)
MCHC: 31.3 g/dL (ref 30.0–36.0)
MCV: 99.1 fL (ref 80.0–100.0)
Monocytes Absolute: 0.4 10*3/uL (ref 0.1–1.0)
Monocytes Relative: 6 %
Neutro Abs: 5.2 10*3/uL (ref 1.7–7.7)
Neutrophils Relative %: 64 %
Platelets: 175 10*3/uL (ref 150–400)
RBC: 3.23 MIL/uL — ABNORMAL LOW (ref 3.87–5.11)
RDW: 14 % (ref 11.5–15.5)
WBC: 8 10*3/uL (ref 4.0–10.5)
nRBC: 0 % (ref 0.0–0.2)

## 2020-01-22 LAB — BASIC METABOLIC PANEL
Anion gap: 6 (ref 5–15)
BUN: 11 mg/dL (ref 8–23)
CO2: 25 mmol/L (ref 22–32)
Calcium: 8.3 mg/dL — ABNORMAL LOW (ref 8.9–10.3)
Chloride: 111 mmol/L (ref 98–111)
Creatinine, Ser: 0.87 mg/dL (ref 0.44–1.00)
GFR calc Af Amer: >60 mL/min (ref >60)
GFR calc non Af Amer: >60 mL/min (ref >60)
Glucose, Bld: 103 mg/dL — ABNORMAL HIGH (ref 70–99)
Potassium: 3.7 mmol/L (ref 3.5–5.1)
Sodium: 142 mmol/L (ref 135–145)

## 2020-01-22 MED ORDER — SODIUM CHLORIDE 0.9 % IV SOLN: 2.0000 g | INTRAVENOUS | Status: DC

## 2020-01-22 MED ORDER — SODIUM CHLORIDE 0.9 % IV SOLN
1.0000 g | INTRAVENOUS | Status: DC
  Administered 2020-01-22: 1 g via INTRAVENOUS
  Filled 2020-01-22: qty 10

## 2020-01-22 MED ORDER — SODIUM CHLORIDE 0.9 % IV BOLUS
500.0000 mL | Freq: Once | INTRAVENOUS | Status: AC
  Administered 2020-01-22: 500 mL via INTRAVENOUS

## 2020-01-22 NOTE — Consult Note (Signed)
[facility] Infectious Disease    Date of Admission:  01/20/2020   Total days of antibiotics: 3        Day 3 of Cefepime       Reason for Consult: Recurrent UTI    Referring Provider: Dr. Benny Lamb Primary Care Provider: Dr. Dorthy Lamb   Assessment: Katelyn Lamb is a 74 y.o. female with a PMHx of HSV encephalomyelitis complicated by neurogenic bladder and subsequent recurrent UTIs who presented to the ED with c/o fever, chills, dysuria, and urinary frequency. Initially on this admission, patient was afebrile and hemodynamically stable but with a mild leukocytosis at 15 and tachycardic in the low 100s. She was started on broad-spectrum antibiotics on 5/19 and narrowed to Cefepime by 5/20 AM. This has been transitioned to Ceftriaxone on 5/21 PM.  In light of significant neurological history with HSV encephalomyelitis complicated by neurogenic bladder, recurrent UTIs are likely secondary to chronic urine stasis.  Blood cultures obtained and show NGTD. Urine cultures came back positive with >100,000 Klebsiella pneumonia. Patient's leukocytosis and vital signs have improved, showing response to current antibiotic therapy, so unlikely to be ESBL. She would benefit from continued suppressive antibiotic therapy but likely Bactrim was not covering the pathogens causing her symptoms. Will await susceptibilities to determine regimen on discharge.   Encouraged patient to follow up again with Urology at [facility] and express their desire for treatment that does not include than in/out. Given the only period of time without UTIs was while she had a foley catheter, further supports need for urological assessment.   Plan: 1. Follow urine susceptibilities  2. Continue Ceftriaxone  3. If additional episodes of diarrhea, please obtain C. Diff testing given high risk   Active Problems:   Sepsis (Katelyn Lamb)   AKI (acute kidney injury) (Katelyn Lamb)   . acyclovir  400 mg Oral BID  . budesonide (PULMICORT)  nebulizer solution  0.25 mg Nebulization BID  . DULoxetine  30 mg Oral q morning - 10a  . DULoxetine  60 mg Oral QHS  . heparin  5,000 Units Subcutaneous Q12H  . omega-3 acid ethyl esters  1 g Oral Daily  . pantoprazole  40 mg Oral QHS  . potassium chloride  20 mEq Oral TID  . pregabalin  100 mg Oral QHS  . QUEtiapine  25 mg Oral QHS  . vitamin B-12  100 mcg Oral Daily    HPI: Katelyn Lamb is a 74 y.o. female with a PMHx of HSV encephalomyelitis complicated by neurogenic bladder and subsequent recurrent UTIs who presented to the ED with c/o fever, chills, dysuria, and urinary frequency.  Patient's husband and daughter are at bedside and assisted with history.  Katelyn Lamb states she has been having symptoms of recurrent UTIs for at least the past 1 to 2 years.  She states that symptoms usually onset with dysuria, urinary frequency and urinary urgency.  She endorses occasional fevers and chills in the past episodes.  Due to frequency of infections, she was started on low-dose of Bactrim by her primary care doctor over the past couple months and had been taking this every day. Approximately 1 month ago after being admitted to the hospital for a fall and transferring to a SNF, patient developed dysuria and urgency while at SNF. She states the Bactrim was discontinued and she was started on Ciprofloxacin. At one point, Cipro was transitioned to Fosfomycin. Katelyn Lamb states symptoms were not improving significantly, so Fosfomycin  was transitioned to a 4th anti-biotic they cannot recall the name of.   Katelyn Lamb says that approximately 1 year ago, she had a foley catheter for a couple months, during which time she did not experience any UTIs. Upon meeting with Urology, the foley cathether was removed and patient was started on bladder training with in/out catheterization. Patient suffered some bleeding and pain with this. They switched Urologist to [facility], who had recommended bi-daily in/out  with PVR log. They were unable to do this therapy as Katelyn Lamb did not feel comfortable and Katelyn Lamb has a tremor. Since then, she has been spontaneously voiding with frequent urinary incontinence episodes.   [name] also notes that she had two episodes of water-y diarrhea today. She denies any abdominal pain, melena, hematochezia. She notes a history of chronic diarrhea, although she has not had it in months now.   She denies any current fever, chills, chest pain, SOB, new or changed joint/muscle pain, nausea, vomiting.   Review of Systems: Review of Systems  Constitutional: Positive for chills, diaphoresis, fever and malaise/fatigue.  Respiratory: Negative for cough and shortness of breath.   Cardiovascular: Negative for chest pain and leg swelling.  Gastrointestinal: Negative for abdominal pain, blood in stool, constipation, diarrhea, melena, nausea and vomiting.  Genitourinary: Positive for dysuria, frequency and urgency. Negative for flank pain and hematuria.  Musculoskeletal: Positive for joint pain and myalgias.  Neurological: Positive for weakness.    Past Medical History:  Diagnosis Date  . Anxiety   . Back pain   . Gait abnormality 11/14/2016  . Memory difficulty 03/02/2019  . Myelitis due to herpes simplex [facility])     Social History   Tobacco Use  . Smoking status: Former Research scientist (life sciences)  . Smokeless tobacco: Never Used  . Tobacco comment: 40 years ago   Substance Use Topics  . Alcohol use: No  . Drug use: No    Family History  Problem Relation Age of Onset  . Hypertension Mother    Allergies  Allergen Reactions  . Demerol [Meperidine] Other (See Comments)    Hallucinations  . Percocet [Oxycodone-Acetaminophen] Itching  . Amoxicillin-Pot Clavulanate Diarrhea    Severe pain, headache, intestinal infection  . Penicillins Itching and Rash    Has patient had a PCN reaction causing immediate rash, facial/tongue/throat swelling, SOB or lightheadedness with hypotension:  NO  Has patient had a PCN reaction causing severe rash involving mucus membranes or skin necrosis: No Has patient had a PCN reaction that required hospitalization: No Has patient had a PCN reaction occurring within the last 10 years: Yes If all of the above answers are "NO", then may proceed with Cephalosporin use.    OBJECTIVE: Blood pressure 96/69, pulse 85, temperature 97.8 F (36.6 C), temperature source Oral, resp. rate (!) 26, height 5\' 5"  (1.651 m), weight 74.3 kg, SpO2 98 %.  Physical Exam Vitals and nursing note reviewed.  Constitutional:      Appearance: She is normal weight. She is ill-appearing (chronically).  HENT:     Head: Normocephalic and atraumatic.     Mouth/Throat:     Mouth: Mucous membranes are dry.     Pharynx: Oropharynx is clear.  Eyes:     Extraocular Movements: Extraocular movements intact.     Conjunctiva/sclera: Conjunctivae normal.     Pupils: Pupils are equal, round, and reactive to light.  Cardiovascular:     Rate and Rhythm: Normal rate and regular rhythm.     Heart sounds:  Murmur (2/6 blowing systolic murmur) present.  Pulmonary:     Effort: Pulmonary effort is normal. No respiratory distress.     Breath sounds: No wheezing.  Abdominal:     General: Bowel sounds are normal. There is no distension.     Palpations: Abdomen is soft.     Tenderness: There is no abdominal tenderness. There is no guarding.  Musculoskeletal:     Right lower leg: No edema.     Left lower leg: No edema.  Skin:    General: Skin is warm and dry.  Neurological:     General: No focal deficit present.     Mental Status: She is alert and oriented to person, place, and time.  Psychiatric:        Mood and Affect: Mood normal.        Behavior: Behavior normal. Behavior is cooperative.     Lab Results Lab Results  Component Value Date   WBC 8.0 01/22/2020   HGB 10.0 (L) 01/22/2020   HCT 32.0 (L) 01/22/2020   MCV 99.1 01/22/2020   PLT 175 01/22/2020    Lab Results   Component Value Date   CREATININE 0.87 01/22/2020   BUN 11 01/22/2020   NA 142 01/22/2020   K 3.7 01/22/2020   CL 111 01/22/2020   CO2 25 01/22/2020    Lab Results  Component Value Date   ALT 19 01/20/2020   AST 37 01/20/2020   ALKPHOS 138 (H) 01/20/2020   BILITOT 0.6 01/20/2020     Microbiology: Recent Results (from the past 240 hour(s))  Blood Culture (routine x 2)     Status: None (Preliminary result)   Collection Time: 01/20/20 10:52 AM   Specimen: BLOOD LEFT FOREARM  Result Value Ref Range Status   Specimen Description BLOOD LEFT FOREARM  Final   Special Requests   Final    BOTTLES DRAWN AEROBIC AND ANAEROBIC Blood Culture results may not be optimal due to an excessive volume of blood received in culture bottles   Culture   Final    NO GROWTH 2 DAYS Performed at [facility], [place] [address], [place], [place] [address]    Report Status PENDING  Incomplete  Blood Culture (routine x 2)     Status: None (Preliminary result)   Collection Time: 01/20/20 10:56 AM   Specimen: BLOOD RIGHT FOREARM  Result Value Ref Range Status   Specimen Description BLOOD RIGHT FOREARM  Final   Special Requests   Final    BOTTLES DRAWN AEROBIC ONLY Blood Culture results may not be optimal due to an inadequate volume of blood received in culture bottles   Culture   Final    NO GROWTH 2 DAYS Performed at [facility], [place] [address], [place], [place] [address]    Report Status PENDING  Incomplete  Urine culture     Status: Abnormal (Preliminary result)   Collection Time: 01/20/20 12:17 PM   Specimen: In/Out Cath Urine  Result Value Ref Range Status   Specimen Description IN/OUT CATH URINE  Final   Special Requests NONE  Final   Culture (A)  Final    >=100,000 COLONIES/mL KLEBSIELLA PNEUMONIAE SUSCEPTIBILITIES TO FOLLOW Performed at [facility], [place] [address] [place], [place] [address]    Report Status PENDING  Incomplete  SARS Coronavirus 2 by RT PCR (hospital  order, performed in [facility] hospital lab) Nasopharyngeal Nasopharyngeal Swab     Status: None   Collection Time:  01/20/20 12:17 PM   Specimen: Nasopharyngeal Swab  Result Value Ref Range Status   SARS Coronavirus 2 NEGATIVE NEGATIVE Final    Comment: (NOTE) SARS-CoV-2 target nucleic acids are NOT DETECTED. The SARS-CoV-2 RNA is generally detectable in upper and lower respiratory specimens during the acute phase of infection. The lowest concentration of SARS-CoV-2 viral copies this assay can detect is 250 copies / mL. A negative result does not preclude SARS-CoV-2 infection and should not be used as the sole basis for treatment or other patient management decisions.  A negative result may occur with improper specimen collection / handling, submission of specimen other than nasopharyngeal swab, presence of viral mutation(s) within the areas targeted by this assay, and inadequate number of viral copies (<250 copies / mL). A negative result must be combined with clinical observations, patient history, and epidemiological information. Fact Sheet for Patients:   StrictlyIdeas.no Fact Sheet for Healthcare Providers: BankingDealers.co.za This test is not yet approved or cleared  by the [place] FDA and has been authorized for detection and/or diagnosis of SARS-CoV-2 by FDA under an Emergency Use Authorization (EUA).  This EUA will remain in effect (meaning this test can be used) for the duration of the COVID-19 declaration under Section 564(b)(1) of the Act, 21 U.S.C. section 360bbb-3(b)(1), unless the authorization is terminated or revoked sooner. Performed at [facility], [place] [address], [place], [place] [address]     Dr. Jose Persia Internal Medicine PGY-1  01/22/2020, 1:51 PM

## 2020-01-22 NOTE — Progress Notes (Signed)
PROGRESS NOTE  Katelyn Lamb [id number] DOB: 1946-03-09 DOA: 01/20/2020 PCP: [name], MD  Brief History   The patient is a 74 yr old woman who presented to [facility] from home with complaints of chills and fever of 102 worsening UTI symptoms including burning with micturition, urinary frequency, and foul smelling urine. She has also been lethargic and confused per the patient's spouse. The patient has had an ongoing issue with UTI due to urinary retention and the need for catheters. She has had to separate consults with urology which have ended badly and without any kind of resolution of her problem. She was been on and off of Cipro in the past 3 weeks. Her last dose of Cipro was the night of 01/19/2020.   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 She has had lumbar compression fractures s/p kyphoplasty.  Urine culture has grown out klebsiella pneumoniae. Antibiotics have been changed to Rocephin. Infectious disease has been consulted to assist with avoidance of infection in the future.  Consultants  . Infectious disease.  Procedures  . None  Antibiotics   Anti-infectives (From admission, onward)   Start     Dose/Rate Route Frequency Ordered Stop   01/22/20 2100  cefTRIAXone (ROCEPHIN) 1 g in sodium chloride 0.9 % 100 mL IVPB     1 g 200 mL/hr over 30 Minutes Intravenous Every 24 hours 01/22/20 1042     01/22/20 1045  cefTRIAXone (ROCEPHIN) 2 g in sodium chloride 0.9 % 100 mL IVPB  Status:  Discontinued     2 g 200 mL/hr over 30 Minutes Intravenous Every 24 hours 01/22/20 1041 01/22/20 1042   01/21/20 0100  vancomycin (VANCOREADY) IVPB 750 mg/150 mL  Status:  Discontinued     750 mg 150 mL/hr over 60 Minutes Intravenous Every 12 hours 01/20/20 1231 01/20/20 1439   01/20/20 2200  ceFEPIme (MAXIPIME) 2 g in sodium chloride 0.9 % 100 mL IVPB  Status:  Discontinued     2  g 200 mL/hr over 30 Minutes Intravenous Every 12 hours 01/20/20 1231 01/22/20 1041   01/20/20 1400  acyclovir (ZOVIRAX) tablet 400 mg     400 mg Oral 2 times daily 01/20/20 1350     01/20/20 1145  ceFEPIme (MAXIPIME) 2 g in sodium chloride 0.9 % 100 mL IVPB     2 g 200 mL/hr over 30 Minutes Intravenous  Once 01/20/20 1130 01/20/20 1258   01/20/20 1145  metroNIDAZOLE (FLAGYL) IVPB 500 mg     500 mg 100 mL/hr over 60 Minutes Intravenous  Once 01/20/20 1130 01/20/20 1258   01/20/20 1145  vancomycin (VANCOCIN) IVPB 1000 mg/200 mL premix  Status:  Discontinued     1,000 mg 200 mL/hr over 60 Minutes Intravenous  Once 01/20/20 1130 01/20/20 1136   01/20/20 1145  vancomycin (VANCOREADY) IVPB 1500 mg/300 mL     1,500 mg 150 mL/hr over 120 Minutes Intravenous  Once 01/20/20 1136 01/20/20 1542     Subjective  The patient is resting comfortably. No new complaints. She states that she is feeling better.  Objective   Vitals:  Vitals:   01/22/20 1400 01/22/20 1420  BP:    Pulse: 81 84  Resp: (!) 23 (!) 22  Temp:    SpO2: 97% 95%   Exam:  Constitutional:  . The patient is awake, alert, and oriented x 3. No acute distress. Respiratory:  .  No increased work of breathing. . No wheezes, rales, or rhonchi . No tactile fremitus Cardiovascular:  . Regular rate and rhythm . No murmurs, ectopy, or gallups. . No lateral PMI. No thrills. Abdomen:  . Abdomen is soft, non-tender, non-distended . No hernias, masses, or organomegaly . Normoactive bowel sounds.  Musculoskeletal:  . No cyanosis, clubbing, or edema Skin:  . No rashes, lesions, ulcers . palpation of skin: no induration or nodules Neurologic:  . CN 2-12 intact . Sensation all 4 extremities intact Psychiatric:  . Mental status o Mood, affect appropriate o Orientation to person, place, time  . judgment and insight appear intact  I have personally reviewed the following:   Today's Data  . Vitals, BMP, CBC  Micro Data   . Urine culture has grown out Klebsiella pneumoniae.  Scheduled Meds: . acyclovir  400 mg Oral BID  . budesonide (PULMICORT) nebulizer solution  0.25 mg Nebulization BID  . DULoxetine  30 mg Oral q morning - 10a  . DULoxetine  60 mg Oral QHS  . heparin  5,000 Units Subcutaneous Q12H  . omega-3 acid ethyl esters  1 g Oral Daily  . pantoprazole  40 mg Oral QHS  . potassium chloride  20 mEq Oral TID  . pregabalin  100 mg Oral QHS  . QUEtiapine  25 mg Oral QHS  . vitamin B-12  100 mcg Oral Daily   Continuous Infusions: . cefTRIAXone (ROCEPHIN)  IV    . sodium chloride      Active Problems:   Sepsis ([place])   AKI (acute kidney injury) ([place])   LOS: 2 days   A & P  Sepsis:Evidenced by signs of end organ damage including change of mentation/acute encephalopathy, AKI, elevated white count, borderline hypotension. No information about urine culture in epic or Care Everywhere, contact patient PCP [facility] physicians, left message. For now we will extend coverage with cefepime, discontinue vancomycin and Flagyl. Antibiotics have been converted to Rocephin as urine cultures have grown out Klebsiella pneumonia.  Neurogenic bladder/chronic urine retention/recurrent UTI: Long discussion with patient and husband, patient was diagnosed with urinary retention last year, and was on 30 days Foley, unfortunately developed massive UTI and Foley discontinued. Urology at that point recommended self cath two times daily, in the morning and evening and document PVR. Patient however unable to perform self cath due to her decreased hands and arm strength and coordination as a result of her encephalitis/meningitis.  Husband tried few times and felt he could not perform such duty either and then stopped.  From then on, patient had few UTI last year, and patient PCP started patient on Bactrim from this January.  But patient still developed 2-3 episodes of UTI this year despite Bactrim.  Husband denies any history of ESBL  episode. Renal ultrasound demonstrated no acute renal finding. There is some renal cortical atrophy evident.  We will check PVR as well. Will consult ID to help patient avoid further infection. The patient and her spouse have declined offer of a urology consult. Infectious disease has been consulted. They will follow her as outpatient.  Wheezing: We will treat as acute bronchitis, recommend outpatient lung function test. Continue bronchodilators and pulmicort.  AKI: Likely from sepsis, received IV boluses and will continue aggressive maintenance fluid for today. Creatinine is 0.87 today. Monitor creatinine, electrolytes, and volume status. Avoid nephrotoxic medications and hypotension.  Hypotension: Hold antihypertensives. IV fluid bolus.  History of HSV encephalitis in 2017: Continue suppression acyclovir.  Depression and anxiety:  We will cut down her Valium. Continue SSRI.  Prolonged QTC: Repeat EKG.  I have seen and examined this patient myself. I have spent 42 minutes in her evaluation and care.  DVT prophylaxis: Heparin subcu Code Status: DNR but ok with intubation for short term Family Communication: Husband at bedside Disposition Plan: PT evaluation, we will need rehab  Shyloh Krinke, DO [facility] Direct contact: see [email or web address]  7PM-7AM contact night coverage as above 01/22/2020, 5:16 PM  LOS: 1 day

## 2020-01-22 NOTE — Progress Notes (Signed)
Physical Therapy Treatment Patient Details Name: Katelyn Lamb MRN: [id number] DOB: Jun 14, 1946 Today's Date: 01/22/2020    History of Present Illness 74 y.o. 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 Pt discharged to SNF [facility] following previous hospitalization for pelvic fx. She had only been home x 1 week prior to current hospitalization.    PT Comments    Pt with better cognition today, however continues to have poor safety awareness. Pt is also limited in safe mobility by decreased strength, balance and endurance. Pt with excessive diarrhea today and request placement of briefs prior to ambulation. Pt able to initiate placement of briefs by bridging up to pull them up, however ultimately requires min A for placement. Pt requires min A for transfers and ambulation of 40 feet with RW. D/c plans remain appropriate. PT will continue to follow acutely..    Follow Up Recommendations  Home health PT;Supervision/Assistance - 24 hour     Equipment Recommendations  None recommended by PT       Precautions / Restrictions Precautions Precautions: Fall;Other (comment) Precaution Comments: watch sats Restrictions Weight Bearing Restrictions: No    Mobility  Bed Mobility Overal bed mobility: Needs Assistance Bed Mobility: Supine to Sit     Supine to sit: HOB elevated;Mod assist     General bed mobility comments: +rail, increased time, cues for sequencing  Transfers Overall transfer level: Needs assistance Equipment used: Rolling walker (2 wheeled) Transfers: Sit to/from Stand Sit to Stand: Min assist         General transfer comment: min A for steadying, increased posterior lean to  brace LE on side of bed, balance better when asked to step away from bed   Ambulation/Gait Ambulation/Gait assistance: Min assist Gait Distance (Feet): 40 Feet Assistive device: Rolling walker (2 wheeled) Gait Pattern/deviations: Step-through pattern;Decreased stride length Gait velocity: decreased Gait velocity interpretation: <1.8 ft/sec, indicate of risk for recurrent falls General Gait Details: min A for ambulation to door and back to bed x2, pt with bout of diarrhea just prior to therapyand prefers not to leave room       Balance Overall balance assessment: Needs assistance Sitting-balance support: No upper extremity supported;Feet supported Sitting balance-Leahy Scale: Good     Standing balance support: Bilateral upper extremity supported;During functional activity Standing balance-Leahy Scale: Poor Standing balance comment: reliant on UE support                            Cognition Arousal/Alertness: Awake/alert Behavior During Therapy: WFL for tasks assessed/performed Overall Cognitive Status: Impaired/Different from baseline Area of Impairment: Orientation;Attention;Memory;Safety/judgement;Following commands;Problem solving;Awareness                   Current Attention Level: Selective Memory: Decreased short-term memory Following Commands: Follows multi-step commands with increased time Safety/Judgement: Decreased awareness of safety;Decreased awareness of deficits Awareness: Emergent Problem Solving: Slow processing;Difficulty sequencing;Requires verbal cues General Comments: Pt less confused today, but asks for clarification from husband to most questions         General Comments General comments (skin integrity, edema, etc.): VSS on RA      Pertinent Vitals/Pain Pain Assessment: No/denies pain           PT Goals (current  goals can now be found in the care plan section) Acute Rehab PT Goals Patient Stated Goal: home PT Goal  Formulation: With patient/family Time For Goal Achievement: 02/04/20 Potential to Achieve Goals: Good Progress towards PT goals: Progressing toward goals    Frequency    Min 3X/week      PT Plan Current plan remains appropriate       AM-PAC PT "6 Clicks" Mobility   Outcome Measure  Help needed turning from your back to your side while in a flat bed without using bedrails?: A Little Help needed moving from lying on your back to sitting on the side of a flat bed without using bedrails?: A Little Help needed moving to and from a bed to a chair (including a wheelchair)?: A Little Help needed standing up from a chair using your arms (e.g., wheelchair or bedside chair)?: A Little Help needed to walk in hospital room?: A Little Help needed climbing 3-5 steps with a railing? : A Lot 6 Click Score: 17    End of Session Equipment Utilized During Treatment: Gait belt Activity Tolerance: Patient tolerated treatment well Patient left: with family/visitor present;in bed;with call bell/phone within reach Nurse Communication: Mobility status PT Visit Diagnosis: Difficulty in walking, not elsewhere classified (R26.2)     Time: [id number] PT Time Calculation (min) (ACUTE ONLY): 23 min  Charges:  $Gait Training: 8-22 mins $Therapeutic Activity: 8-22 mins                     Elizabeth B. Migdalia Dk PT, DPT Acute Rehabilitation Services Pager [phone] Office [phone]    [place] 01/22/2020, 6:10 PM

## 2020-01-23 ENCOUNTER — Inpatient Hospital Stay: Payer: Self-pay

## 2020-01-23 DIAGNOSIS — J440 Chronic obstructive pulmonary disease with acute lower respiratory infection: Secondary | ICD-10-CM

## 2020-01-23 DIAGNOSIS — L293 Anogenital pruritus, unspecified: Secondary | ICD-10-CM

## 2020-01-23 DIAGNOSIS — E118 Type 2 diabetes mellitus with unspecified complications: Secondary | ICD-10-CM

## 2020-01-23 DIAGNOSIS — J209 Acute bronchitis, unspecified: Secondary | ICD-10-CM

## 2020-01-23 LAB — CBC WITH DIFFERENTIAL/PLATELET
Abs Immature Granulocytes: 0.01 10*3/uL (ref 0.00–0.07)
Basophils Absolute: 0 10*3/uL (ref 0.0–0.1)
Basophils Relative: 0 %
Eosinophils Absolute: 0.5 10*3/uL (ref 0.0–0.5)
Eosinophils Relative: 7 %
HCT: 37.9 % (ref 36.0–46.0)
Hemoglobin: 12 g/dL (ref 12.0–15.0)
Immature Granulocytes: 0 %
Lymphocytes Relative: 31 %
Lymphs Abs: 2.2 10*3/uL (ref 0.7–4.0)
MCH: 30.7 pg (ref 26.0–34.0)
MCHC: 31.7 g/dL (ref 30.0–36.0)
MCV: 96.9 fL (ref 80.0–100.0)
Monocytes Absolute: 0.5 10*3/uL (ref 0.1–1.0)
Monocytes Relative: 7 %
Neutro Abs: 3.8 10*3/uL (ref 1.7–7.7)
Neutrophils Relative %: 55 %
Platelets: 205 10*3/uL (ref 150–400)
RBC: 3.91 MIL/uL (ref 3.87–5.11)
RDW: 14.3 % (ref 11.5–15.5)
WBC: 6.9 10*3/uL (ref 4.0–10.5)
nRBC: 0 % (ref 0.0–0.2)

## 2020-01-23 LAB — URINE CULTURE: Culture: >=100000 — AB

## 2020-01-23 MED ORDER — ESTROGENS, CONJUGATED 0.625 MG/GM VA CREA
1.0000 | TOPICAL_CREAM | Freq: Every day | VAGINAL | Status: DC
  Filled 2020-01-23: qty 30

## 2020-01-23 MED ORDER — SODIUM CHLORIDE 0.9 % IV SOLN
1.0000 g | Freq: Three times a day (TID) | INTRAVENOUS | Status: DC
  Administered 2020-01-23 – 2020-01-24 (×4): 1 g via INTRAVENOUS
  Filled 2020-01-23 (×6): qty 1

## 2020-01-23 NOTE — Progress Notes (Signed)
PROGRESS NOTE  Katelyn Lamb [id number] DOB: 01/17/1946 DOA: 01/20/2020 PCP: [name], MD  Brief History   The patient is a 74 yr old woman who presented to [facility] from home with complaints of chills and fever of 102 worsening UTI symptoms including burning with micturition, urinary frequency, and foul smelling urine. She has also been lethargic and confused per the patient's spouse. The patient has had an ongoing issue with UTI due to urinary retention and the need for catheters. She has had to separate consults with urology which have ended badly and without any kind of resolution of her problem. She was been on and off of Cipro in the past 3 weeks. Her last dose of Cipro was the night of 01/19/2020.   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 She has had lumbar compression fractures s/p kyphoplasty.  Urine culture has grown out klebsiella pneumoniae which is resistant to all but carbapenems and nitrofurantoins.. Antibiotics have again been changed, this time to meropenem. Infectious disease has recommended placement of a midline catheter. The patient may then be able to be discharged to complete her 10 days course of ertapenem.  Consultants  . Infectious disease.  Procedures  . None  Antibiotics   Anti-infectives (From admission, onward)   Start     Dose/Rate Route Frequency Ordered Stop   01/23/20 1400  meropenem (MERREM) 1 g in sodium chloride 0.9 % 100 mL IVPB     1 g 200 mL/hr over 30 Minutes Intravenous Every 8 hours 01/23/20 1016     01/22/20 2100  cefTRIAXone (ROCEPHIN) 1 g in sodium chloride 0.9 % 100 mL IVPB  Status:  Discontinued     1 g 200 mL/hr over 30 Minutes Intravenous Every 24 hours 01/22/20 1042 01/23/20 1016   01/22/20 1045  cefTRIAXone (ROCEPHIN) 2 g in sodium chloride 0.9 % 100 mL IVPB  Status:  Discontinued     2 g 200 mL/hr over 30 Minutes  Intravenous Every 24 hours 01/22/20 1041 01/22/20 1042   01/21/20 0100  vancomycin (VANCOREADY) IVPB 750 mg/150 mL  Status:  Discontinued     750 mg 150 mL/hr over 60 Minutes Intravenous Every 12 hours 01/20/20 1231 01/20/20 1439   01/20/20 2200  ceFEPIme (MAXIPIME) 2 g in sodium chloride 0.9 % 100 mL IVPB  Status:  Discontinued     2 g 200 mL/hr over 30 Minutes Intravenous Every 12 hours 01/20/20 1231 01/22/20 1041   01/20/20 1400  acyclovir (ZOVIRAX) tablet 400 mg     400 mg Oral 2 times daily 01/20/20 1350     01/20/20 1145  ceFEPIme (MAXIPIME) 2 g in sodium chloride 0.9 % 100 mL IVPB     2 g 200 mL/hr over 30 Minutes Intravenous  Once 01/20/20 1130 01/20/20 1258   01/20/20 1145  metroNIDAZOLE (FLAGYL) IVPB 500 mg     500 mg 100 mL/hr over 60 Minutes Intravenous  Once 01/20/20 1130 01/20/20 1258   01/20/20 1145  vancomycin (VANCOCIN) IVPB 1000 mg/200 mL premix  Status:  Discontinued     1,000 mg 200 mL/hr over 60 Minutes Intravenous  Once 01/20/20 1130 01/20/20 1136   01/20/20 1145  vancomycin (VANCOREADY) IVPB 1500 mg/300 mL     1,500 mg 150 mL/hr over 120 Minutes Intravenous  Once 01/20/20 1136 01/20/20 1542     Subjective  The patient is resting comfortably. She  is complaining of burning in her perineum especially with urination.   Objective   Vitals:  Vitals:   01/23/20 1111 01/23/20 1446  BP: 114/78 97/71  Pulse: 72 68  Resp: 19 16  Temp: 98.3 F (36.8 C) 98.2 F (36.8 C)  SpO2: 98% 94%   Exam:  Constitutional:  . The patient is awake, alert, and oriented x 3. No acute distress. Respiratory:  . No increased work of breathing. . No wheezes, rales, or rhonchi . No tactile fremitus Cardiovascular:  . Regular rate and rhythm . No murmurs, ectopy, or gallups. . No lateral PMI. No thrills. Abdomen:  . Abdomen is soft, non-tender, non-distended . No hernias, masses, or organomegaly . Normoactive bowel sounds.  Musculoskeletal:  . No cyanosis, clubbing, or  edema Skin:  . No rashes, lesions, ulcers . palpation of skin: no induration or nodules Neurologic:  . CN 2-12 intact . Sensation all 4 extremities intact' .  Psychiatric:  . Mental status o Mood, affect appropriate o Orientation to person, place, time  . judgment and insight appear intact Genitourinary: Perineum appears erythematous. No discharge apparent.  I have personally reviewed the following:   Today's Data  . Vitals,CBC  Doctor, general practice  . Urine culture has grown out Klebsiella pneumoniae. It is resistant to all but nitrufurantoin and carbapenems  Scheduled Meds: . acyclovir  400 mg Oral BID  . budesonide (PULMICORT) nebulizer solution  0.25 mg Nebulization BID  . conjugated estrogens  1 Applicatorful Vaginal Daily  . DULoxetine  30 mg Oral q morning - 10a  . DULoxetine  60 mg Oral QHS  . heparin  5,000 Units Subcutaneous Q12H  . omega-3 acid ethyl esters  1 g Oral Daily  . pantoprazole  40 mg Oral QHS  . pregabalin  100 mg Oral QHS  . QUEtiapine  25 mg Oral QHS  . vitamin B-12  100 mcg Oral Daily   Continuous Infusions: . meropenem (MERREM) IV 1 g (01/23/20 1331)    Active Problems:   Sepsis ([place])   AKI (acute kidney injury) ([place])   LOS: 3 days   A & P  Sepsis:Evidenced by signs of end organ damage including change of mentation/acute encephalopathy, AKI, elevated white count, borderline hypotension. No information about urine culture in epic or Care Everywhere, contact patient PCP [facility] physicians, left message. For now we will extend coverage with cefepime, discontinue vancomycin and Flagyl. Antibiotics have been converted to Rocephin as urine cultures have grown out Klebsiella pneumonia.  Neurogenic bladder/chronic urine retention/recurrent UTI: Long discussion with patient and husband, patient was diagnosed with urinary retention last year, and was on 30 days Foley, unfortunately developed massive UTI and Foley discontinued. Urology at that point recommended  self cath two times daily, in the morning and evening and document PVR. Patient however unable to perform self cath due to her decreased hands and arm strength and coordination as a result of her encephalitis/meningitis.  Husband tried few times and felt he could not perform such duty either and then stopped.  From then on, patient had few UTI last year, and patient PCP started patient on Bactrim from this January.  But patient still developed 2-3 episodes of UTI this year despite Bactrim.  Husband denies any history of ESBL episode. Renal ultrasound demonstrated no acute renal finding. There is some renal cortical atrophy evident.  We will check PVR as well. Will consult ID to help patient avoid further infection. The patient and her spouse have declined offer of  a urology consult. Infectious disease has been consulted. They will follow her as outpatient.  Perineal irritation: Likely due to the patient's low estrogen status and the placement of the external catheter. I have discussed this with nursing. They will try to urge the patient to go without catheter.  Wheezing: We will treat as acute bronchitis, recommend outpatient lung function test. Continue bronchodilators and pulmicort.  AKI: Likely from sepsis, received IV boluses and will continue aggressive maintenance fluid for today. Creatinine is 0.87 today. Monitor creatinine, electrolytes, and volume status. Avoid nephrotoxic medications and hypotension.  Hypotension: Hold antihypertensives. IV fluid bolus.  History of HSV encephalitis in 2017: Continue suppression acyclovir.  Depression and anxiety: We will cut down her Valium. Continue SSRI.  Prolonged QTC: Repeat EKG.  I have seen and examined this patient myself. I have spent 32 minutes in her evaluation and care.  DVT prophylaxis: Heparin subcu Code Status: DNR but ok with intubation for short term Family Communication: Husband at bedside Disposition Plan: PT evaluation, we will  need rehab  Manaal Mandala, DO [facility] Direct contact: see [email or web address]  7PM-7AM contact night coverage as above 01/23/2020, 4:41 PM  LOS: 1 day

## 2020-01-23 NOTE — Progress Notes (Signed)
Pt wants to change her code status to Full Code. I cut her DNR bracelet off at her and her husband's request. Message sent Dr Benny Lennert.

## 2020-01-23 NOTE — Progress Notes (Signed)
At bedside for PICC placement.  Pt and husband present, verbalized the "surprise" of needing the PICC was told to them about "30 minutes ago" and have not had time to process information. Both verbalize that pt has had a PICC in the past. Risks and benefits explained, pt and husband verbalize to wait until tomorrow to place PICC.  Pt stated she had some anxiety re sterile drape and being alone underneath it.  Both had a lot of questions re medications, home health, discharge plans etc. as well as frustrations re disease process and treatments prior to admission. Time spent at bedside almost an hour. PICC Consent signed.  Plan on placement 01/23/20 pm.  Marya Amsler RN notified of discussion and current plan.

## 2020-01-23 NOTE — TOC Initial Note (Signed)
Transition of Care [facility]) - Initial/Assessment Note    Patient Details  Name: Katelyn Lamb MRN: [id number] Date of Birth: 02-Nov-1945  Transition of Care [facility]) CM/SW Contact:    Claudie Leach, RN 01/23/2020, 5:53 PM  Clinical Narrative:                 CM contacted as patient is having PICC placed for home IV antibiotics. Discussed Dc plan with husband and patient at length.  They both desire for patient to return home.  Husband is willing and able to learn and administer IV antibiotics.  Dr. Benny Lennert advises to plan for d/c tomorrow.   Pam with [facility] accepted referral for IV medications.  [facility] and [facility] are unable to take patient referral.   [facility] will advise tomorrow if they can take patient.    Expected Discharge Plan: [place] Services Barriers to Discharge: Other (comment)(IV abx/ HH needs)   Patient Goals and CMS Choice Patient states their goals for this hospitalization and ongoing recovery are:: to get home CMS Medicare.gov Compare Post Acute Care list provided to:: Patient Represenative (must comment)(husband) Choice offered to / list presented to : Spouse  Expected Discharge Plan and Services Expected Discharge Plan: [place]   Discharge Planning Services: CM Consult Post Acute Care Choice: [place] arrangements for the past 2 months: [place], [place] Agency: [facility] Date [facility] Agency Contacted: 01/23/20 Time [place]: 1752 Representative spoke with at [place]: [place]  Prior Living Arrangements/Services Living arrangements for the past 2 months: [place], [place] Lives with:: Spouse Patient language and need for interpreter reviewed:: Yes Do you feel safe going back to the place where you live?: Yes      Need for Family Participation in Patient Care: Yes (Comment) Care giver support system in place?: Yes  (comment)   Criminal Activity/Legal Involvement Pertinent to Current Situation/Hospitalization: No - Comment as needed  Activities of Daily Living Home Assistive Devices/Equipment: Environmental consultant (specify type), Wheelchair, Shower chair without back, Grab bars in shower, Bedside commode/3-in-1 ADL Screening (condition at time of admission) Patient's cognitive ability adequate to safely complete daily activities?: No Is the patient deaf or have difficulty hearing?: No Does the patient have difficulty seeing, even when wearing glasses/contacts?: No Does the patient have difficulty concentrating, remembering, or making decisions?: Yes Patient able to express need for assistance with ADLs?: Yes Does the patient have difficulty dressing or bathing?: Yes Independently performs ADLs?: No Communication: Independent Dressing (OT): Needs assistance Is this a change from baseline?: Pre-admission baseline Grooming: Needs assistance Is this a change from baseline?: Pre-admission baseline Feeding: Independent Bathing: Needs assistance Is this a change from baseline?: Pre-admission baseline Toileting: Needs assistance Is this a change from baseline?: Pre-admission baseline In/Out Bed: Needs assistance Is this a change from baseline?: Pre-admission baseline Walks in Home: Needs assistance Is this a change from baseline?: Pre-admission baseline Does the patient have difficulty walking or climbing stairs?: Yes Weakness of Legs: Both Weakness of Arms/Hands: None  Permission Sought/Granted                  Emotional Assessment              Admission diagnosis:  AKI (acute kidney injury) ([place]) [N17.9]  Sepsis ([place]) [A41.9] Sepsis with acute renal failure and septic shock, due to unspecified organism, unspecified acute renal failure type ([place]) [A41.9, R65.21, N17.9] Patient Active Problem List   Diagnosis Date Noted  . Laceration of left hand 12/09/2019  . DNR (do not resuscitate) 12/09/2019   . Pelvic fracture ([place]) 12/06/2019  . AKI (acute kidney injury) ([place]) 12/06/2019  . Severe sepsis ([place]) 07/23/2019  . UTI (urinary tract infection) 07/23/2019  . Hematuria 07/23/2019  . Acute respiratory failure with hypoxia ([place]) 07/23/2019  . Hypokalemia 07/23/2019  . Acute on chronic anemia 07/23/2019  . Fever 07/23/2019  . Leukocytosis 07/23/2019  . Sepsis ([place]) 07/23/2019  . Generalized anxiety disorder 06/17/2019  . Menopausal sweats 06/17/2019  . Memory difficulty 03/02/2019  . Degenerative spondylolisthesis 09/02/2018  . Delirium 03/26/2018  . SIRS (systemic inflammatory response syndrome) ([place]) 03/26/2018  . 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 Degeneration of lumbar intervertebral disc 11/08/2017  . Lumbar radiculopathy 11/06/2017  . Chronic neck pain 09/12/2017  . Chronic low back pain 09/06/2017  . Chronic pain syndrome 09/06/2017  . Dilated pancreatic duct 01/24/2017  . DVT, lower extremity, distal, chronic ([place]) 01/22/2017  . Elevated serum GGT level 01/22/2017  . Medication monitoring encounter 01/08/2017  . Gait abnormality 11/14/2016  . Hypotension due to drugs   . Urinary retention   . Anxiety about health   . Reactive depression   . Ataxia   . Abdominal spasms   . Constipation due to pain medication   . Acute lower UTI   . Dysuria   . Acute deep vein thrombosis (DVT) of popliteal vein of left lower extremity ([place])   . Incomplete paraplegia ([place])   . Acute blood loss anemia   . Neurogenic bladder   . Neuropathic pain   . Muscle spasm   . Gastroesophageal reflux disease   . Slow transit constipation   . Thrombocytopenia ([place]) 10/03/2016  . Abnormal MRI, spinal cord   . Encephalomyelitis   . Numbness   . Intractable back pain 09/20/2016  . Numbness of left lower extremity 09/20/2016  . Hyponatremia 09/20/2016  . Herpes zoster without complication 0000000  . Spondylosis of cervical region without myelopathy or  radiculopathy 06/16/2015   PCP:  [name], MD Pharmacy:   [id number] - [place], [place]. AT [address] [place] [address] Phone: [phone] Fax: [phone]     Social Determinants of Health (SDOH) Interventions    Readmission Risk Interventions No flowsheet data found.

## 2020-01-23 NOTE — Progress Notes (Signed)
PHARMACY CONSULT NOTE FOR:  OUTPATIENT  PARENTERAL ANTIBIOTIC THERAPY (OPAT)  Indication: ESBL UTI Regimen: Ertapenem 1 gm IV daily End date: 02/01/2020  IV antibiotic discharge orders are pended. To discharging provider:  please sign these orders via discharge navigator,  Select New Orders & click on the button choice - Manage This Unsigned Work.    Barth Kirks, PharmD, BCPS, BCCCP Clinical Pharmacist [phone]  Please check AMION for all [place] numbers  01/23/2020 5:25 PM

## 2020-01-23 NOTE — Progress Notes (Addendum)
ID PROGRESS NOTE   Klebsiella sensitivities found to be ESBL. Will change antibiotics to meropenem. Patient reports diarrhea improved  Remains afebrile  A: plan to treat as complicated urinary tract infection  Will need midline for IV carbapenem  - can change to daily ertapenem 1gm for discharge  - plan to treat for 10 days  Taisa Deloria [facility] [phone]

## 2020-01-24 DIAGNOSIS — B961 Klebsiella pneumoniae [K. pneumoniae] as the cause of diseases classified elsewhere: Secondary | ICD-10-CM

## 2020-01-24 DIAGNOSIS — G629 Polyneuropathy, unspecified: Secondary | ICD-10-CM

## 2020-01-24 DIAGNOSIS — L989 Disorder of the skin and subcutaneous tissue, unspecified: Secondary | ICD-10-CM

## 2020-01-24 DIAGNOSIS — N319 Neuromuscular dysfunction of bladder, unspecified: Secondary | ICD-10-CM

## 2020-01-24 DIAGNOSIS — Z1612 Extended spectrum beta lactamase (ESBL) resistance: Secondary | ICD-10-CM

## 2020-01-24 DIAGNOSIS — R9431 Abnormal electrocardiogram [ECG] [EKG]: Secondary | ICD-10-CM

## 2020-01-24 MED ORDER — CHLORHEXIDINE GLUCONATE CLOTH 2 % EX PADS: 6.0000 | MEDICATED_PAD | Freq: Every day | CUTANEOUS | Status: DC

## 2020-01-24 MED ORDER — BUDESONIDE 0.25 MG/2ML IN SUSP: 0.2500 mg | Freq: Two times a day (BID) | RESPIRATORY_TRACT | 12 refills | Status: DC

## 2020-01-24 MED ORDER — SODIUM CHLORIDE 0.9% FLUSH: 10.0000 mL | INTRAVENOUS | Status: DC | PRN

## 2020-01-24 MED ORDER — ERTAPENEM IV (FOR PTA / DISCHARGE USE ONLY): 1.0000 g | INTRAVENOUS | 0 refills | Status: DC

## 2020-01-24 MED ORDER — SODIUM CHLORIDE 0.9% FLUSH: 10.0000 mL | INTRAVENOUS | 0 refills | Status: DC | PRN

## 2020-01-24 MED ORDER — SODIUM CHLORIDE 0.9% FLUSH: 10.0000 mL | Freq: Two times a day (BID) | INTRAVENOUS | Status: DC

## 2020-01-24 MED ORDER — SODIUM CHLORIDE 0.9% FLUSH: 10.0000 mL | Freq: Two times a day (BID) | INTRAVENOUS | 0 refills | Status: DC

## 2020-01-24 NOTE — Progress Notes (Signed)
Peripherally Inserted Central Catheter Placement  The IV Nurse has discussed with the patient and/or persons authorized to consent for the patient, the purpose of this procedure and the potential benefits and risks involved with this procedure.  The benefits include less needle sticks, lab draws from the catheter, and the patient may be discharged home with the catheter. Risks include, but not limited to, infection, bleeding, blood clot (thrombus formation), and puncture of an artery; nerve damage and irregular heartbeat and possibility to perform a PICC exchange if needed/ordered by physician.  Alternatives to this procedure were also discussed.  Bard Power PICC patient education guide, fact sheet on infection prevention and patient information card has been provided to patient /or left at bedside.  Consent signed 01-23-20 1740  PICC Placement Documentation  PICC Single Lumen 01/24/20 PICC Right Brachial 37 cm 0 cm (Active)  Indication for Insertion or Continuance of Line Home intravenous therapies (PICC only) 01/24/20 1603  Exposed Catheter (cm) 0 cm 01/24/20 1603  Site Assessment Clean;Dry;Intact 01/24/20 1603  Line Status Flushed;Saline locked;Blood return noted 01/24/20 1603  Dressing Type Transparent 01/24/20 1603  Dressing Status Clean;Dry;Intact 01/24/20 1603  Dressing Intervention New dressing 01/24/20 1603  Dressing Change Due 01/31/20 01/24/20 1603       Tashawna Thom, Nicolette Bang 01/24/2020, 4:04 PM

## 2020-01-24 NOTE — Discharge Summary (Signed)
Physician Discharge Summary  Katelyn [facility] RJJ:[id number] DOB: Nov 04, 1945 DOA: 01/20/2020  PCP: Katelyn Amel, MD  Admit date: 01/20/2020 Discharge date: 01/24/2020  Recommendations for Outpatient Follow-up:  1. Discharge to [facility]. 2. Complete 9 days of IV ertapenem. 3. Follow up with Infectious disease as outpatient as directed. 4. Get appointment with Gynecological urology as outpatient regarding urinary retention. 5. Follow up with PCP in 7-10 days.  Follow-up Information    [facility], [facility].   Specialty: [facility] Why: [facility], physical and occupational therapist will visit from this agency.  [facility] first visit will be Monday afternoon 5/24.  [facility] or office will call you to confirm. Contact information: [facility] [address] [phone]        [facility].   Why: This company will provide your antibiotic.  [facility] will provide support in addition to your [facility] nurse from [facility] Your antibiotic will be delivered  Monday morning.  They will call you to confirm. Contact information: [phone]         Discharge Diagnoses: Principal diagnosis is #1 1. Sepsis 2. UTI due to MDRO Klebsiella pneumoniae 3. Chronic urinary retention 4. History of HSV encephalitis with resultant neuropathy 5. Acute bronchitis 6. Prolonged QTc 7. Perineal irritation  Discharge Condition: Fair  Disposition: [facility] with PICC for continuation of IV ertapenem and [facility].  Diet recommendation: Heart healthy  Filed Weights   01/22/20 0625 01/23/20 0541 01/24/20 0506  Weight: 74.3 kg 70.8 kg 71.8 kg    History of present illness:  K[facility]n [facility] is a 74 y.o. 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  Her left hip symptoms started about 1 week ago, with burning sensation, urinary frequency, and cloudy smelly urine.  She was treated with Cipro for 7 days which completed yesterday, however her urinary symptoms persisted.  Her PCP called her yesterday saying urine culture came back showing Cipro will not cover, and her PCP started her on Macrobid since yesterday which she took her first dose last night.  Overnight patient started to feel worsening of chills.  She started to spiking fever this morning of 102 F.  Patient's husband also noticed patient became more sleepy this morning, and somewhat confused. ED Course: UA WBC>50, large leukocytes, WBC 17.8, creatinine 1.2.  Hypertensive, received 30 cc/kg bolus and BP stabilized  Hospital Course:  The patient is a 74 yr old woman who presented to [facility] with complaints of chills and fever of 102 worsening UTI symptoms including burning with micturition, urinary frequency, and foul smelling urine. She has also been lethargic and confused per the patient's spouse. The patient has had an ongoing issue with UTI due to urinary retention and the need for catheters. She has had to separate consults with urology which have ended badly and without any kind of resolution of her problem. She was been on and off of Cipro in the past 3 weeks. Her last dose of Cipro was the night of 01/19/2020.   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 She has had lumbar compression fractures s/p  kyphoplasty.  Urine culture has grown out klebsiella pneumoniae which is resistant to all but carbapenems and nitrofurantoins.. Antibiotics have again been changed, this time to meropenem. Infectious disease has recommended placement of a midline catheter. The patient may then be able to be  discharged to complete her 10 days course of ertapenem.   Today's assessment: S: The patient is resting quietly. No acute distress. O: Vitals:  Vitals:   01/24/20 0923 01/24/20 1200  BP:  102/66  Pulse:  77  Resp:    Temp:  98.2 F (36.8 C)  SpO2: 95% 92%    Exam:  Constitutional:  . The patient is awake, alert, and oriented x 3. No acute distress. Respiratory:  . No increased work of breathing. . No wheezes, rales, or rhonchi . No tactile fremitus Cardiovascular:  . Regular rate and rhythm . No murmurs, ectopy, or gallups. . No lateral PMI. No thrills. Abdomen:  . Abdomen is soft, non-tender, non-distended . No hernias, masses, or organomegaly . Normoactive bowel sounds.  Musculoskeletal:  . No cyanosis, clubbing, or edema Skin:  . No rashes, lesions, ulcers . palpation of skin: no induration or nodules Neurologic:  . CN 2-12 intact . Sensation all 4 extremities intact Psychiatric:  . Mental status o Mood, affect appropriate o Orientation to person, place, time  . judgment and insight appear intact   Discharge Instructions  Discharge Instructions    Activity as tolerated - No restrictions   Complete by: As directed    [facility] pharmacist to adjust dose for Vancomycin, Aminoglycosides and other anti-infective therapies as requested by physician.   Complete by: As directed    [facility] to provide Cath Flo 1m   Complete by: As directed    Administer for PICC line occlusion and as ordered by physician for other access device issues.   Anaphylaxis Kit: Provided to treat any anaphylactic reaction to the medication being provided to the patient if First Dose or when requested by physician   Complete by: As directed    Epinephrine 145mml vial / amp: Administer 0.60m77m0.60ml40mubcutaneously once for moderate to severe anaphylaxis, nurse to call physician and pharmacy when reaction occurs and call 911 if needed for immediate care    Diphenhydramine 50mg87mIV vial: Administer 25-50mg 8mM PRN for first dose reaction, rash, itching, mild reaction, nurse to call physician and pharmacy when reaction occurs   Sodium Chloride 0.9% NS 500ml I69mdminister if needed for hypovolemic blood pressure drop or as ordered by physician after call to physician with anaphylactic reaction   Call MD for:  difficulty breathing, headache or visual disturbances   Complete by: As directed    Call MD for:  extreme fatigue   Complete by: As directed    Call MD for:  persistant dizziness or light-headedness   Complete by: As directed    Call MD for:  severe uncontrolled pain   Complete by: As directed    Call MD for:  temperature >100.4   Complete by: As directed    Change dressing on IV access line weekly and PRN   Complete by: As directed    Diet - low sodium heart healthy   Complete by: As directed    Discharge instructions   Complete by: As directed    [facility]charge to [facility]. Complete 9 days of IV ertapenem. Follow up with Infectious disease as outpatient as directed. Get appointment with Gynecological urology as outpatient regarding urinary retention. Follow  up with PCP in 7-10 days.   Flush IV access with Sodium Chloride 0.9% and Heparin 10 units/ml or 100 units/ml   Complete by: As directed    [facility] instructions - [facility]   Complete by: As directed    Instructions: Flush IV access with Sodium Chloride 0.9% and Heparin 10units/ml or 100units/ml   Change dressing on IV access line: Weekly and PRN   Instructions Cath Flo 72m: Administer for PICC Line occlusion and as ordered by physician for other access device   [facility] pharmacist to adjust dose for: Vancomycin, Aminoglycosides and other anti-infective therapies as requested by physician   Increase activity slowly   Complete by: As directed    Method of administration may be changed at the discretion of [facility] pharmacist  based upon assessment of the patient and/or caregiver's ability to self-administer the medication ordered   Complete by: As directed    Outpatient Parenteral Antibiotic Therapy Information Antibiotic: Ertapenem (Invanz) IVPB; Indications for use: Recurrent/resistant Klebsiella UTI; End Date: 02/01/2020   Complete by: As directed    Antibiotic: Ertapenem (Invanz) IVPB   Indications for use: Recurrent/resistant Klebsiella UTI   End Date: 02/01/2020     Allergies as of 01/24/2020      Reactions   Demerol [meperidine] Other (See Comments)   Hallucinations   Percocet [oxycodone-acetaminophen] Itching   Amoxicillin-pot Clavulanate Diarrhea   Severe pain, headache, intestinal infection   Penicillins Itching, Rash   Has patient had a PCN reaction causing immediate rash, facial/tongue/throat swelling, SOB or lightheadedness with hypotension:  NO Has patient had a PCN reaction causing severe rash involving mucus membranes or skin necrosis: No Has patient had a PCN reaction that required hospitalization: No Has patient had a PCN reaction occurring within the last 10 years: Yes If all of the above answers are "NO", then may proceed with Cephalosporin use.      Medication List    STOP taking these medications   ciprofloxacin 500 MG tablet Commonly known as: CIPRO   nitrofurantoin (macrocrystal-monohydrate) 100 MG capsule Commonly known as: MACROBID   sulfamethoxazole-trimethoprim 400-80 MG tablet Commonly known as: BACTRIM     TAKE these medications   acyclovir 400 MG tablet Commonly known as: ZOVIRAX TAKE 1 TABLET BY MOUTH TWICE A DAY   budesonide 0.25 MG/2ML nebulizer solution Commonly known as: PULMICORT Take 2 mLs (0.25 mg total) by nebulization 2 (two) times daily.   CALCIUM 600-D PO Take 1 tablet by mouth in the morning and at bedtime.   conjugated estrogens vaginal cream Commonly known as: PREMARIN Place 1 Applicatorful vaginally daily as needed (itching).   diazepam 5 MG  tablet Commonly known as: VALIUM Take 5 mg by mouth 2 (two) times daily.   diphenoxylate-atropine 2.5-0.025 MG tablet Commonly known as: LOMOTIL Take 1 tablet by mouth 4 (four) times daily as needed for diarrhea or loose stools.   DULoxetine 30 MG capsule Commonly known as: CYMBALTA TAKE 1 CAPSULE BY MOUTH EVERY DAY What changed:   how much to take  when to take this  Another medication with the same name was removed. Continue taking this medication, and follow the directions you see here.   DULoxetine 60 MG capsule Commonly known as: CYMBALTA Take 1 capsule (60 mg total) by mouth at bedtime. What changed:   Another medication with the same name was changed. Make sure you understand how and when to take each.  Another medication with the same name was removed. Continue  taking this medication, and follow the directions you see here.   ertapenem  IVPB Commonly known as: INVANZ Inject 1 g into the vein daily for 9 days. Indication:  ESBL UTI First Dose: Yes Last Day of Therapy:  02/01/2020 Labs - Once weekly:  CBC/D and BMP, Labs - Every other week:  ESR and CRP Method of administration: Mini-Bag Plus / Gravity Method of administration may be changed at the discretion of [facility] pharmacist based upon assessment of the patient and/or caregiver's ability to self-administer the medication ordered.   Hair/Skin/Nails/Biotin Tabs Take 1 tablet by mouth daily.   HYDROcodone-acetaminophen 10-325 MG tablet Commonly known as: NORCO Take 1 tablet by mouth every 6 (six) hours as needed for pain.   ipratropium-albuterol 0.5-2.5 (3) MG/3ML Soln Commonly known as: DUONEB Take 3 mLs by nebulization every 4 (four) hours as needed (For SOB and wheezing).   omega-3 acid ethyl esters 1 g capsule Commonly known as: LOVAZA Take 1 g by mouth daily.   pantoprazole 40 MG tablet Commonly known as: PROTONIX Take 1 tablet (40 mg total) by mouth daily. What changed: when to take this    polyethylene glycol 17 g packet Commonly known as: MIRALAX / GLYCOLAX Take 17 g by mouth daily. What changed:   when to take this  reasons to take this   pregabalin 50 MG capsule Commonly known as: LYRICA Take 100 mg by mouth at bedtime.   QUEtiapine 25 MG tablet Commonly known as: SEROquel Take 1 tablet (25 mg total) by mouth at bedtime.   VITAMIN B-12 PO Take 1 tablet by mouth daily.   Voltaren 1 % Gel Generic drug: diclofenac Sodium Apply 1 application topically daily as needed (pain).            Discharge Care Instructions  (From admission, onward)         Start     Ordered   01/24/20 0000  Change dressing on IV access line weekly and PRN  ([facility] instructions - [facility] )     01/24/20 0755         Allergies  Allergen Reactions  . Demerol [Meperidine] Other (See Comments)    Hallucinations  . Percocet [Oxycodone-Acetaminophen] Itching  . Amoxicillin-Pot Clavulanate Diarrhea    Severe pain, headache, intestinal infection  . Penicillins Itching and Rash    Has patient had a PCN reaction causing immediate rash, facial/tongue/throat swelling, SOB or lightheadedness with hypotension:  NO Has patient had a PCN reaction causing severe rash involving mucus membranes or skin necrosis: No Has patient had a PCN reaction that required hospitalization: No Has patient had a PCN reaction occurring within the last 10 years: Yes If all of the above answers are "NO", then may proceed with Cephalosporin use.    The results of significant diagnostics from this hospitalization (including imaging, microbiology, ancillary and laboratory) are listed below for reference.    Significant Diagnostic Studies: US RENAL  Result Date: 01/20/2020 CLINICAL DATA:  74 year old female with acute renal injury. EXAM: RENAL / URINARY TRACT ULTRASOUND COMPLETE COMPARISON:  CT Abdomen and Pelvis 07/23/2019. FINDINGS: Right Kidney: Renal measurements: 8.6 x 5.0 x 4.3 cm =  volume: 96 mild mL . Echogenicity within normal limits. Thinning of the cortex (image 6). No mass or hydronephrosis visualized. Left Kidney: Renal measurements: 8.4 x 5.0 x 4.3 cm = volume: 95 mL. Echogenicity within normal limits. Thinning of the cortex (image 19). No mass or hydronephrosis visualized. Bladder: Appears normal for  degree of bladder distention. Other: None. IMPRESSION: No acute renal finding.  Some renal cortical atrophy is evident. Electronically Signed   By: Genevie Ann M.D.   On: 01/20/2020 15:14   DG Chest Port 1 View  Result Date: 01/20/2020 CLINICAL DATA:  Shortness of breath. Weakness, fever and somnolence. On antibiotics for urinary tract infection. EXAM: PORTABLE CHEST 1 VIEW COMPARISON:  Radiographs 12/06/2019 and 07/13/2019. CT 07/23/2019. FINDINGS: 1126 hours. The heart size and mediastinal contours are stable. There are lower lung volumes with increased streaky bibasilar pulmonary opacities, radiographically most consistent with atelectasis. No consolidation, edema, pleural effusion or pneumothorax. The bones appear unchanged. Telemetry leads overlie the chest. IMPRESSION: Lower lung volumes with increased streaky bibasilar pulmonary opacities, radiographically consistent with atelectasis. No consolidation or pleural effusion. Electronically Signed   By: Richardean Sale M.D.   On: 01/20/2020 11:38   [place] EKG SITE RITE  Result Date: 01/23/2020 If [facility] image not attached, placement could not be confirmed due to current cardiac rhythm.   Microbiology: Recent Results (from the past 240 hour(s))  Blood Culture (routine x 2)     Status: None (Preliminary result)   Collection Time: 01/20/20 10:52 AM   Specimen: BLOOD LEFT FOREARM  Result Value Ref Range Status   Specimen Description BLOOD LEFT FOREARM  Final   Special Requests   Final    BOTTLES DRAWN AEROBIC AND ANAEROBIC Blood Culture results may not be optimal due to an excessive volume of blood received in culture bottles    Culture   Final    NO GROWTH 4 DAYS Performed at [facility], [place] [address] [place], [place] [address]    Report Status PENDING  Incomplete  Blood Culture (routine x 2)     Status: None (Preliminary result)   Collection Time: 01/20/20 10:56 AM   Specimen: BLOOD RIGHT FOREARM  Result Value Ref Range Status   Specimen Description BLOOD RIGHT FOREARM  Final   Special Requests   Final    BOTTLES DRAWN AEROBIC ONLY Blood Culture results may not be optimal due to an inadequate volume of blood received in culture bottles   Culture   Final    NO GROWTH 4 DAYS Performed at [facility], [place] [address], [place], [place] [address]    Report Status PENDING  Incomplete  Urine culture     Status: Abnormal   Collection Time: 01/20/20 12:17 PM   Specimen: In/Out Cath Urine  Result Value Ref Range Status   Specimen Description IN/OUT CATH URINE  Final   Special Requests   Final    NONE Performed at [facility], [place] [address], [place], [place] [address]    Culture (A)  Final    >=100,000 COLONIES/mL KLEBSIELLA PNEUMONIAE Confirmed Extended Spectrum Beta-Lactamase Producer (ESBL).  In bloodstream infections from ESBL organisms, carbapenems are preferred over piperacillin/tazobactam. They are shown to have a lower risk of mortality.    Report Status 01/23/2020 FINAL  Final   Organism ID, Bacteria KLEBSIELLA PNEUMONIAE (A)  Final      Susceptibility   Klebsiella pneumoniae - MIC*    AMPICILLIN >=32 RESISTANT Resistant     CEFAZOLIN >=64 RESISTANT Resistant     CEFTRIAXONE >=64 RESISTANT Resistant     CIPROFLOXACIN >=4 RESISTANT Resistant     GENTAMICIN >=16 RESISTANT Resistant     IMIPENEM <=0.25 SENSITIVE Sensitive     NITROFURANTOIN 32 SENSITIVE Sensitive     TRIMETH/SULFA >=320 RESISTANT Resistant  AMPICILLIN/SULBACTAM >=32 RESISTANT Resistant     PIP/TAZO 32 INTERMEDIATE Intermediate     * >=100,000 COLONIES/mL KLEBSIELLA PNEUMONIAE  SARS Coronavirus 2 by RT  PCR (hospital order, performed in [facility] hospital lab) Nasopharyngeal Nasopharyngeal Swab     Status: None   Collection Time: 01/20/20 12:17 PM   Specimen: Nasopharyngeal Swab  Result Value Ref Range Status   SARS Coronavirus 2 NEGATIVE NEGATIVE Final    Comment: (NOTE) SARS-CoV-2 target nucleic acids are NOT DETECTED. The SARS-CoV-2 RNA is generally detectable in upper and lower respiratory specimens during the acute phase of infection. The lowest concentration of SARS-CoV-2 viral copies this assay can detect is 250 copies / mL. A negative result does not preclude SARS-CoV-2 infection and should not be used as the sole basis for treatment or other patient management decisions.  A negative result may occur with improper specimen collection / handling, submission of specimen other than nasopharyngeal swab, presence of viral mutation(s) within the areas targeted by this assay, and inadequate number of viral copies (<250 copies / mL). A negative result must be combined with clinical observations, patient history, and epidemiological information. Fact Sheet for Patients:   StrictlyIdeas.no Fact Sheet for Healthcare Providers: BankingDealers.co.za This test is not yet approved or cleared  by the [place] FDA and has been authorized for detection and/or diagnosis of SARS-CoV-2 by FDA under an Emergency Use Authorization (EUA).  This EUA will remain in effect (meaning this test can be used) for the duration of the COVID-19 declaration under Section 564(b)(1) of the Act, 21 U.S.C. section 360bbb-3(b)(1), unless the authorization is terminated or revoked sooner. Performed at [facility], [place] [address] [place], [place] [address]      Labs: Basic Metabolic Panel: Recent Labs  Lab 01/20/20 1049 01/20/20 1104 01/21/20 0355 01/22/20 0357  NA 142 141 141 142  K 3.7 3.5 3.2* 3.7  CL 106  --  108 111  CO2 22  --  24 25   GLUCOSE 169*  --  112* 103*  BUN 13  --  11 11  CREATININE 1.21*  --  0.90 0.87  CALCIUM 8.4*  --  8.2* 8.3*   Liver Function Tests: Recent Labs  Lab 01/20/20 1049  AST 37  ALT 19  ALKPHOS 138*  BILITOT 0.6  PROT 6.6  ALBUMIN 3.2*   No results for input(s): LIPASE, AMYLASE in the last 168 hours. No results for input(s): AMMONIA in the last 168 hours. CBC: Recent Labs  Lab 01/20/20 1049 01/20/20 1104 01/21/20 0355 01/22/20 0357 01/23/20 0451  WBC 17.8*  --  15.4* 8.0 6.9  NEUTROABS 15.3*  --   --  5.2 3.8  HGB 12.4 12.9 10.5* 10.0* 12.0  HCT 39.8 38.0 34.6* 32.0* 37.9  MCV 98.5  --  100.9* 99.1 96.9  PLT 243  --  173 175 205   Cardiac Enzymes: No results for input(s): CKTOTAL, CKMB, CKMBINDEX, TROPONINI in the last 168 hours. BNP: BNP (last 3 results) No results for input(s): BNP in the last 8760 hours.  ProBNP (last 3 results) No results for input(s): PROBNP in the last 8760 hours.  CBG: No results for input(s): GLUCAP in the last 168 hours.  Active Problems:   Sepsis ([place])   AKI (acute kidney injury) ([place])   Time coordinating discharge: 38 minutes  Signed:        Ava Swayze, DO [facility]  01/24/2020, 2:13 PM

## 2020-01-24 NOTE — TOC Transition Note (Addendum)
Transition of Care [facility]) - CM/SW Discharge Note   Patient Details  Name: Katelyn [facility] MRN: [id number] Date of Birth: April 24, 1946  [facility] CM/SW Contact:  Claudie Leach, RN 01/24/2020, 10:28 AM   Clinical Narrative:    Patient to d/c home with IV antibiotics (Ertapenem daily) for 9 days.  [facility] accepted referral for RN/PT/OT and can start RN visits Monday afternoon for first home dose.  Antibiotic will be delivered to patient's home Monday morning per Merry Proud at [facility].  Carolynn Sayers, RN with [facility] discussed plan with patient's husband last night and will also provide support.  This was explained in detail to the patient and husband.  Also added to the AVS.  Patient awaiting PICC placement.    Final next level of care: [place] Barriers to Discharge: No Barriers Identified   Patient Goals and CMS Choice Patient states their goals for this hospitalization and ongoing recovery are:: to get home CMS Medicare.gov Compare Post Acute Care list provided to:: Patient Represenative (must comment)(husband) Choice offered to / list presented to : Spouse   Discharge Plan and Services   Discharge Planning Services: CM Consult Post Acute Care Choice: Home Health           [facility] Agency: [facility] Date [place]: 01/23/20 Time HH Agency Contacted: 1800 Representative spoke with at [place]

## 2020-01-24 NOTE — Progress Notes (Signed)
[facility] for Infectious Disease    Date of Admission:  01/20/2020   Total days of antibiotics 5/day 2 meropenem           ID: Katelyn Lamb is a 74 y.o. female with recurrent uti -now ESBL Active Problems:   Sepsis ([place])   AKI (acute kidney injury) ([place])    Subjective: She reports that she had burning urination that was improved with premarin. Waiting for picc line placement  Medications:  . acyclovir  400 mg Oral BID  . budesonide (PULMICORT) nebulizer solution  0.25 mg Nebulization BID  . conjugated estrogens  1 Applicatorful Vaginal Daily  . DULoxetine  30 mg Oral q morning - 10a  . DULoxetine  60 mg Oral QHS  . heparin  5,000 Units Subcutaneous Q12H  . omega-3 acid ethyl esters  1 g Oral Daily  . pantoprazole  40 mg Oral QHS  . pregabalin  100 mg Oral QHS  . QUEtiapine  25 mg Oral QHS  . vitamin B-12  100 mcg Oral Daily    Objective: Vital signs in last 24 hours: Temp:  [97.7 F (36.5 C)-98.8 F (37.1 C)] 97.7 F (36.5 C) (05/23 1435) Pulse Rate:  [70-91] 70 (05/23 1435) Resp:  [15-20] 18 (05/23 1435) BP: (100-114)/(62-83) 105/79 (05/23 1435) SpO2:  [92 %-98 %] 93 % (05/23 1435) FiO2 (%):  [21 %] 21 % (05/23 0923) Weight:  [71.8 kg] 71.8 kg (05/23 0506) Physical Exam  Constitutional:  oriented to person, place, and time. appears well-developed and well-nourished. No distress.  HENT: [place]/AT, PERRLA, no scleral icterus Mouth/Throat: Oropharynx is clear and moist. No oropharyngeal exudate.  Cardiovascular: Normal rate, regular rhythm and normal heart sounds. Exam reveals no gallop and no friction rub.  No murmur heard.  Pulmonary/Chest: Effort normal and breath sounds normal. No respiratory distress.  has no wheezes.  Neck = supple, no nuchal rigidity Abdominal: Soft. Bowel sounds are normal.  exhibits no distension. There is no tenderness.  Lymphadenopathy: no cervical adenopathy. No axillary adenopathy Neurological: alert and oriented to person, place,  and time.  Skin: Skin is warm and dry. No rash noted. No erythema.  Psychiatric: a normal mood and affect.  behavior is normal.    Lab Results Recent Labs    01/22/20 0357 01/23/20 0451  WBC 8.0 6.9  HGB 10.0* 12.0  HCT 32.0* 37.9  NA 142  --   K 3.7  --   CL 111  --   CO2 25  --   BUN 11  --   CREATININE 0.87  --     Microbiology: Reviewed  Abnormal  >=100,000 COLONIES/mL KLEBSIELLA PNEUMONIAE  Confirmed Extended Spectrum Beta-Lactamase Producer (ESBL). In bloodstream infections from ESBL organisms, carbapenems are preferred over piperacillin/tazobactam. They are shown to have a lower risk of mortality.    Report Status 01/23/2020 FINAL   Organism ID, Bacteria KLEBSIELLA PNEUMONIAEAbnormal    Resulting Agency [facility]  Susceptibility   Klebsiella pneumoniae    MIC    AMPICILLIN >=32 RESIST... Resistant    AMPICILLIN/SULBACTAM >=32 RESIST... Resistant    CEFAZOLIN >=64 RESIST... Resistant    CEFTRIAXONE >=64 RESIST... Resistant    CIPROFLOXACIN >=4 RESISTANT  Resistant    GENTAMICIN >=16 RESIST... Resistant    IMIPENEM <=0.25 SENS... Sensitive    NITROFURANTOIN 32 SENSITIVE  Sensitive    PIP/TAZO 32 INTERMED... Intermediate    TRIMETH/SULFA >=320 RESIS... Resistant       Studies/Results: [place] EKG SITE  RITE  Result Date: 01/23/2020 If [facility] image not attached, placement could not be confirmed due to current cardiac rhythm.    Assessment/Plan: ESBL kleb pneumonaie complicated uti = plan to treat with ertapenem 1gm Iv dailiy through 5/31 then pull picc line. We will see her back in the ID clinic for management for chronic suppression with macrobid until she sees urology  Will try to see if can get her into urology clinic within 2 wks.  Will sign off  [facility] for Infectious Diseases Cell: [phone] Pager: [phone]  01/24/2020, 3:41 PM

## 2020-01-24 NOTE — Discharge Instructions (Signed)
Urinary Tract Infection, Adult A urinary tract infection (UTI) is an infection of any part of the urinary tract. The urinary tract includes:  The kidneys.  The ureters.  The bladder.  The urethra. These organs make, store, and get rid of pee (urine) in the body. What are the causes? This is caused by germs (bacteria) in your genital area. These germs grow and cause swelling (inflammation) of your urinary tract. What increases the risk? You are more likely to develop this condition if:  You have a small, thin tube (catheter) to drain pee.  You cannot control when you pee or poop (incontinence).  You are female, and: ? You use these methods to prevent pregnancy:  A medicine that kills sperm (spermicide).  A device that blocks sperm (diaphragm). ? You have low levels of a female hormone (estrogen). ? You are pregnant.  You have genes that add to your risk.  You are sexually active.  You take antibiotic medicines.  You have trouble peeing because of: ? A prostate that is bigger than normal, if you are female. ? A blockage in the part of your body that drains pee from the bladder (urethra). ? A kidney stone. ? A nerve condition that affects your bladder (neurogenic bladder). ? Not getting enough to drink. ? Not peeing often enough.  You have other conditions, such as: ? Diabetes. ? A weak disease-fighting system (immune system). ? Sickle cell disease. ? Gout. ? Injury of the spine. What are the signs or symptoms? Symptoms of this condition include:  Needing to pee right away (urgently).  Peeing often.  Peeing small amounts often.  Pain or burning when peeing.  Blood in the pee.  Pee that smells bad or not like normal.  Trouble peeing.  Pee that is cloudy.  Fluid coming from the vagina, if you are female.  Pain in the belly or lower back. Other symptoms include:  Throwing up (vomiting).  No urge to eat.  Feeling mixed up (confused).  Being tired  and grouchy (irritable).  A fever.  Watery poop (diarrhea). How is this treated? This condition may be treated with:  Antibiotic medicine.  Other medicines.  Drinking enough water. Follow these instructions at home:  Medicines  Take over-the-counter and prescription medicines only as told by your doctor.  If you were prescribed an antibiotic medicine, take it as told by your doctor. Do not stop taking it even if you start to feel better. General instructions  Make sure you: ? Pee until your bladder is empty. ? Do not hold pee for a long time. ? Empty your bladder after sex. ? Wipe from front to back after pooping if you are a female. Use each tissue one time when you wipe.  Drink enough fluid to keep your pee pale yellow.  Keep all follow-up visits as told by your doctor. This is important. Contact a doctor if:  You do not get better after 1-2 days.  Your symptoms go away and then come back. Get help right away if:  You have very bad back pain.  You have very bad pain in your lower belly.  You have a fever.  You are sick to your stomach (nauseous).  You are throwing up. Summary  A urinary tract infection (UTI) is an infection of any part of the urinary tract.  This condition is caused by germs in your genital area.  There are many risk factors for a UTI. These include having a small, thin   tube to drain pee and not being able to control when you pee or poop.  Treatment includes antibiotic medicines for germs.  Drink enough fluid to keep your pee pale yellow. This information is not intended to replace advice given to you by your health care provider. Make sure you discuss any questions you have with your health care provider. Document Revised: 08/07/2018 Document Reviewed: 02/27/2018 Elsevier Patient Education  2020 Elsevier Inc.  

## 2020-01-25 DIAGNOSIS — A498 Other bacterial infections of unspecified site: Secondary | ICD-10-CM | POA: Diagnosis not present

## 2020-01-25 DIAGNOSIS — Z87440 Personal history of urinary (tract) infections: Secondary | ICD-10-CM

## 2020-01-25 DIAGNOSIS — N390 Urinary tract infection, site not specified: Secondary | ICD-10-CM | POA: Diagnosis not present

## 2020-01-25 LAB — CULTURE, BLOOD (ROUTINE X 2)
Culture: NO GROWTH
Culture: NO GROWTH

## 2020-01-28 DIAGNOSIS — N390 Urinary tract infection, site not specified: Secondary | ICD-10-CM | POA: Diagnosis not present

## 2020-01-28 DIAGNOSIS — A498 Other bacterial infections of unspecified site: Secondary | ICD-10-CM | POA: Diagnosis not present

## 2020-01-29 ENCOUNTER — Telehealth: Payer: Self-pay

## 2020-01-29 NOTE — Telephone Encounter (Signed)
Patient's husband called office today with concerns regarding patient. States that patient is having issues moving around. Is needing assistance to move with walker and husband. Describes legs "like rubber" offer no support to move around. Would like to know if MD has reviewed most recent blood work. Would also like to know if this could be related to antibiotics. Would like call from MD if possible. [place]

## 2020-02-01 ENCOUNTER — Emergency Department (HOSPITAL_COMMUNITY): Payer: PPO

## 2020-02-01 ENCOUNTER — Encounter (HOSPITAL_COMMUNITY): Payer: Self-pay

## 2020-02-01 ENCOUNTER — Other Ambulatory Visit: Payer: Self-pay

## 2020-02-01 ENCOUNTER — Inpatient Hospital Stay (HOSPITAL_COMMUNITY)
Admission: EM | Admit: 2020-02-01 | Discharge: 2020-02-06 | DRG: 690 | Disposition: A | Discharge status: Home Health | Payer: PPO | Attending: Internal Medicine | Admitting: Internal Medicine

## 2020-02-01 ENCOUNTER — Telehealth: Payer: Self-pay | Admitting: Internal Medicine

## 2020-02-01 DIAGNOSIS — F418 Other specified anxiety disorders: Secondary | ICD-10-CM | POA: Diagnosis present

## 2020-02-01 DIAGNOSIS — N319 Neuromuscular dysfunction of bladder, unspecified: Secondary | ICD-10-CM | POA: Diagnosis present

## 2020-02-01 DIAGNOSIS — W19XXXA Unspecified fall, initial encounter: Secondary | ICD-10-CM | POA: Diagnosis not present

## 2020-02-01 DIAGNOSIS — Z8249 Family history of ischemic heart disease and other diseases of the circulatory system: Secondary | ICD-10-CM

## 2020-02-01 DIAGNOSIS — M5124 Other intervertebral disc displacement, thoracic region: Secondary | ICD-10-CM | POA: Diagnosis not present

## 2020-02-01 DIAGNOSIS — M4802 Spinal stenosis, cervical region: Secondary | ICD-10-CM | POA: Diagnosis not present

## 2020-02-01 DIAGNOSIS — I959 Hypotension, unspecified: Secondary | ICD-10-CM | POA: Diagnosis present

## 2020-02-01 DIAGNOSIS — R5381 Other malaise: Secondary | ICD-10-CM | POA: Diagnosis present

## 2020-02-01 DIAGNOSIS — N390 Urinary tract infection, site not specified: Secondary | ICD-10-CM | POA: Diagnosis not present

## 2020-02-01 DIAGNOSIS — R531 Weakness: Secondary | ICD-10-CM

## 2020-02-01 DIAGNOSIS — M549 Dorsalgia, unspecified: Secondary | ICD-10-CM | POA: Diagnosis present

## 2020-02-01 DIAGNOSIS — Z87440 Personal history of urinary (tract) infections: Secondary | ICD-10-CM

## 2020-02-01 DIAGNOSIS — Z87891 Personal history of nicotine dependence: Secondary | ICD-10-CM

## 2020-02-01 DIAGNOSIS — A498 Other bacterial infections of unspecified site: Secondary | ICD-10-CM

## 2020-02-01 DIAGNOSIS — Z515 Encounter for palliative care: Secondary | ICD-10-CM | POA: Diagnosis not present

## 2020-02-01 DIAGNOSIS — Z79891 Long term (current) use of opiate analgesic: Secondary | ICD-10-CM | POA: Diagnosis not present

## 2020-02-01 DIAGNOSIS — K219 Gastro-esophageal reflux disease without esophagitis: Secondary | ICD-10-CM | POA: Diagnosis present

## 2020-02-01 DIAGNOSIS — R410 Disorientation, unspecified: Secondary | ICD-10-CM | POA: Diagnosis not present

## 2020-02-01 DIAGNOSIS — G8929 Other chronic pain: Secondary | ICD-10-CM | POA: Diagnosis present

## 2020-02-01 DIAGNOSIS — G9340 Encephalopathy, unspecified: Secondary | ICD-10-CM | POA: Diagnosis not present

## 2020-02-01 DIAGNOSIS — R4589 Other symptoms and signs involving emotional state: Secondary | ICD-10-CM | POA: Diagnosis present

## 2020-02-01 DIAGNOSIS — M6281 Muscle weakness (generalized): Secondary | ICD-10-CM | POA: Diagnosis not present

## 2020-02-01 DIAGNOSIS — M4800 Spinal stenosis, site unspecified: Secondary | ICD-10-CM | POA: Diagnosis not present

## 2020-02-01 DIAGNOSIS — R29898 Other symptoms and signs involving the musculoskeletal system: Secondary | ICD-10-CM | POA: Diagnosis not present

## 2020-02-01 DIAGNOSIS — Z20822 Contact with and (suspected) exposure to covid-19: Secondary | ICD-10-CM | POA: Diagnosis present

## 2020-02-01 DIAGNOSIS — Z90710 Acquired absence of both cervix and uterus: Secondary | ICD-10-CM

## 2020-02-01 DIAGNOSIS — R262 Difficulty in walking, not elsewhere classified: Secondary | ICD-10-CM | POA: Diagnosis not present

## 2020-02-01 DIAGNOSIS — Z8619 Personal history of other infectious and parasitic diseases: Secondary | ICD-10-CM

## 2020-02-01 DIAGNOSIS — Z79899 Other long term (current) drug therapy: Secondary | ICD-10-CM | POA: Diagnosis not present

## 2020-02-01 DIAGNOSIS — F0390 Unspecified dementia without behavioral disturbance: Secondary | ICD-10-CM | POA: Diagnosis present

## 2020-02-01 DIAGNOSIS — D539 Nutritional anemia, unspecified: Secondary | ICD-10-CM | POA: Diagnosis present

## 2020-02-01 DIAGNOSIS — Z8661 Personal history of infections of the central nervous system: Secondary | ICD-10-CM

## 2020-02-01 DIAGNOSIS — R0902 Hypoxemia: Secondary | ICD-10-CM | POA: Diagnosis not present

## 2020-02-01 DIAGNOSIS — Z66 Do not resuscitate: Secondary | ICD-10-CM | POA: Diagnosis present

## 2020-02-01 DIAGNOSIS — S32512A Fracture of superior rim of left pubis, initial encounter for closed fracture: Secondary | ICD-10-CM | POA: Diagnosis not present

## 2020-02-01 DIAGNOSIS — S32599G Other specified fracture of unspecified pubis, subsequent encounter for fracture with delayed healing: Secondary | ICD-10-CM | POA: Diagnosis not present

## 2020-02-01 DIAGNOSIS — M48061 Spinal stenosis, lumbar region without neurogenic claudication: Secondary | ICD-10-CM | POA: Diagnosis not present

## 2020-02-01 LAB — BASIC METABOLIC PANEL
Anion gap: 10 (ref 5–15)
BUN: 7 mg/dL — ABNORMAL LOW (ref 8–23)
CO2: 28 mmol/L (ref 22–32)
Calcium: 9 mg/dL (ref 8.9–10.3)
Chloride: 101 mmol/L (ref 98–111)
Creatinine, Ser: 0.86 mg/dL (ref 0.44–1.00)
GFR calc Af Amer: >60 mL/min (ref >60)
GFR calc non Af Amer: >60 mL/min (ref >60)
Glucose, Bld: 104 mg/dL — ABNORMAL HIGH (ref 70–99)
Potassium: 3.5 mmol/L (ref 3.5–5.1)
Sodium: 139 mmol/L (ref 135–145)

## 2020-02-01 LAB — URINALYSIS, ROUTINE W REFLEX MICROSCOPIC
Bilirubin Urine: NEGATIVE
Glucose, UA: NEGATIVE mg/dL
Hgb urine dipstick: NEGATIVE
Ketones, ur: NEGATIVE mg/dL
Leukocytes,Ua: NEGATIVE
Nitrite: NEGATIVE
Protein, ur: NEGATIVE mg/dL
Specific Gravity, Urine: 1.005 (ref 1.005–1.030)
pH: 7 (ref 5.0–8.0)

## 2020-02-01 LAB — CBG MONITORING, ED: Glucose-Capillary: 93 mg/dL (ref 70–99)

## 2020-02-01 LAB — CBC
HCT: 37.6 % (ref 36.0–46.0)
Hemoglobin: 11.4 g/dL — ABNORMAL LOW (ref 12.0–15.0)
MCH: 30.5 pg (ref 26.0–34.0)
MCHC: 30.3 g/dL (ref 30.0–36.0)
MCV: 100.5 fL — ABNORMAL HIGH (ref 80.0–100.0)
Platelets: 271 10*3/uL (ref 150–400)
RBC: 3.74 MIL/uL — ABNORMAL LOW (ref 3.87–5.11)
RDW: 13.2 % (ref 11.5–15.5)
WBC: 7.5 10*3/uL (ref 4.0–10.5)
nRBC: 0 % (ref 0.0–0.2)

## 2020-02-01 MED ORDER — MORPHINE SULFATE (PF) 4 MG/ML IV SOLN
4.0000 mg | Freq: Once | INTRAVENOUS | Status: AC
  Administered 2020-02-01: 4 mg via INTRAVENOUS
  Filled 2020-02-01: qty 1

## 2020-02-01 MED ORDER — SODIUM CHLORIDE 0.9% FLUSH
3.0000 mL | Freq: Once | INTRAVENOUS | Status: AC
  Administered 2020-02-01: 3 mL via INTRAVENOUS

## 2020-02-01 NOTE — TOC Initial Note (Signed)
Transition of Care [facility]) - Initial/Assessment Note    Patient Details  Name: Katelyn Lamb MRN: [id number] Date of Birth: 10/13/1945  [facility] CM/SW Contact:    Ova Freshwater Phone Number: [phone] 02/01/2020, 1:27 PM  Clinical Narrative:                  CSW requested palliative care consult for patient, spoke with ED/RN and she stated she would relay the message to the EDP.        Patient Goals and CMS Choice        Expected Discharge Plan and Services                                                Prior Living Arrangements/Services                       Activities of Daily Living      Permission Sought/Granted                  Emotional Assessment              Admission diagnosis:  sick Patient Active Problem List   Diagnosis Date Noted  . History of recurrent UTI (urinary tract infection)   . Bacterial infection due to Klebsiella pneumoniae   . Laceration of left hand 12/09/2019  . DNR (do not resuscitate) 12/09/2019  . Pelvic fracture ([place]) 12/06/2019  . AKI (acute kidney injury) ([place]) 12/06/2019  . Severe sepsis ([place]) 07/23/2019  . UTI (urinary tract infection) 07/23/2019  . Hematuria 07/23/2019  . Acute respiratory failure with hypoxia ([place]) 07/23/2019  . Hypokalemia 07/23/2019  . Acute on chronic anemia 07/23/2019  . Fever 07/23/2019  . Leukocytosis 07/23/2019  . Sepsis ([place]) 07/23/2019  . Generalized anxiety disorder 06/17/2019  . Menopausal sweats 06/17/2019  . Memory difficulty 03/02/2019  . Degenerative spondylolisthesis 09/02/2018  . Delirium 03/26/2018  . SIRS (systemic inflammatory response syndrome) ([place]) 03/26/2018  . 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 Degeneration of lumbar intervertebral disc 11/08/2017  . Lumbar radiculopathy 11/06/2017  . Chronic neck pain 09/12/2017  . Chronic low back pain 09/06/2017  . Chronic pain syndrome 09/06/2017   . Dilated pancreatic duct 01/24/2017  . DVT, lower extremity, distal, chronic ([place]) 01/22/2017  . Elevated serum GGT level 01/22/2017  . Medication monitoring encounter 01/08/2017  . Gait abnormality 11/14/2016  . Hypotension due to drugs   . Urinary retention   . Anxiety about health   . Reactive depression   . Ataxia   . Abdominal spasms   . Constipation due to pain medication   . Acute lower UTI   . Dysuria   . Acute deep vein thrombosis (DVT) of popliteal vein of left lower extremity ([place])   . Incomplete paraplegia ([place])   . Acute blood loss anemia   . Neurogenic bladder   . Neuropathic pain   . Muscle spasm   . Gastroesophageal reflux disease   . Slow transit constipation   . Thrombocytopenia ([place]) 10/03/2016  . Abnormal MRI, spinal cord   . Encephalomyelitis   . Numbness   . Intractable back pain 09/20/2016  . Numbness of left lower extremity 09/20/2016  . Hyponatremia 09/20/2016  . Herpes zoster without complication 0000000  . Spondylosis of cervical  region without myelopathy or radiculopathy 06/16/2015   PCP:  [name], MD Pharmacy:   [id number] - [place], [address] AT [address] [place] [address] Phone: [phone] Fax: [phone]     Social Determinants of Health (SDOH) Interventions    Readmission Risk Interventions No flowsheet data found.

## 2020-02-01 NOTE — ED Triage Notes (Signed)
Patient arrived by [facility] for increased weakness and hypotension. patient finishing antibiotics for recent UTI. Patient denies pain. EMS reports that patient BP 80s with sitting. Normal intake and output, pale on assessment

## 2020-02-01 NOTE — ED Notes (Signed)
Pt transferred to MRI

## 2020-02-01 NOTE — Telephone Encounter (Signed)
Spoke to patient's husband who states that his wife is having lower extremity weakness, unable to stand on her own feet without assistance, denies fever, denies dysuria, no incontinence -she is being treated for ESBL kleb UTI with ertapenem. He asked if this was due to her abtx,  Recommend to come to the ED/call EMS for transport. Concern that she needs urgent medical evaluation +/- imaging. He reports his wife was reluctant to come to the ED over the weekend and has steadily worsened.  Will see once she comes to hospital.  Caren Griffins B. [place] for Infectious Diseases [phone]

## 2020-02-01 NOTE — Progress Notes (Signed)
ID PROGRESS NOTE  Ms. Kundert is a 74yo F with remote hx of transverse myelitis complicated by neurogenic bladder and recurrent uti, she was hospitalized roughly 10 days ago with fever and urinary discomfort and incontinence concerning for recurrent UTI. She was found to have ESBL klebsiella UTI. She was discharged on ertapenem for which she finished on 5/28. The patient over the last 48-72hrs has had progressive lower extremity weakness and unable to stand on her own, which is NEW. Also, she reports sensation of heaviness to her legs. She is a 2 person assist in assessing her gait.  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 She has not been assessed for orthostatic here. I have not tested her gait.  Recommend to have her admitted for neuro evaluation  Please MRI spine to see if having discitis?/ impingement to explain her symptoms Call if questions,  Caren Griffins B. [place] for Infectious Diseases [phone]

## 2020-02-01 NOTE — ED Provider Notes (Signed)
[facility] EMERGENCY DEPARTMENT Provider Note   CSN: [id number] Arrival date & time: 02/01/20  1145     History Chief Complaint  Patient presents with  . Weakness    Katelyn Lamb is a 74 y.o. female.  HPI   54yF with LE weakness.  Currently finishing up treatment for ESBL Klebsiella UTI with ertapenem via PICC.  This weakness is new within the past couple days.  She is now to the point where she is unable to stand without significant assistance and there have been two incidences where her husband had to assist her to the ground because he could not physically hold her up.  She has a past history of HSV myelitis. She has had chronic neurologic symptoms since that time but these symptoms recently are an acute change.  Patient herself is not a very good historian.  History primarily from review of records and her husband at bedside.  He says that some days are better than others in terms of her mental status but he does not feel like it is necessarily acutely changed.  He is primarily concerned about the weakness she has been exhibiting. She does say her legs feel weak. Has chronic pain in her back and numbness in feet but this hasn't changed recently.   Past Medical History:  Diagnosis Date  . Anxiety   . Back pain   . Gait abnormality 11/14/2016  . Memory difficulty 03/02/2019  . Myelitis due to herpes simplex [facility])     Patient Active Problem List   Diagnosis Date Noted  . History of recurrent UTI (urinary tract infection)   . Bacterial infection due to Klebsiella pneumoniae   . Laceration of left hand 12/09/2019  . DNR (do not resuscitate) 12/09/2019  . Pelvic fracture ([place]) 12/06/2019  . AKI (acute kidney injury) ([place]) 12/06/2019  . Severe sepsis ([place]) 07/23/2019  . UTI (urinary tract infection) 07/23/2019  . Hematuria 07/23/2019  . Acute respiratory failure with hypoxia ([place]) 07/23/2019  . Hypokalemia 07/23/2019  . Acute on chronic anemia 07/23/2019  .  Fever 07/23/2019  . Leukocytosis 07/23/2019  . Sepsis ([place]) 07/23/2019  . Generalized anxiety disorder 06/17/2019  . Menopausal sweats 06/17/2019  . Memory difficulty 03/02/2019  . Degenerative spondylolisthesis 09/02/2018  . Delirium 03/26/2018  . SIRS (systemic inflammatory response syndrome) ([place]) 03/26/2018  . 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 Degeneration of lumbar intervertebral disc 11/08/2017  . Lumbar radiculopathy 11/06/2017  . Chronic neck pain 09/12/2017  . Chronic low back pain 09/06/2017  . Chronic pain syndrome 09/06/2017  . Dilated pancreatic duct 01/24/2017  . DVT, lower extremity, distal, chronic ([place]) 01/22/2017  . Elevated serum GGT level 01/22/2017  . Medication monitoring encounter 01/08/2017  . Gait abnormality 11/14/2016  . Hypotension due to drugs   . Urinary retention   . Anxiety about health   . Reactive depression   . Ataxia   . Abdominal spasms   . Constipation due to pain medication   . Acute lower UTI   . Dysuria   . Acute deep vein thrombosis (DVT) of popliteal vein of left lower extremity ([place])   . Incomplete paraplegia ([place])   . Acute blood loss anemia   . Neurogenic bladder   . Neuropathic pain   . Muscle spasm   . Gastroesophageal reflux disease   . Slow transit constipation   . Thrombocytopenia ([place]) 10/03/2016  . Abnormal MRI, spinal cord   . Encephalomyelitis   .  Numbness   . Intractable back pain 09/20/2016  . Numbness of left lower extremity 09/20/2016  . Hyponatremia 09/20/2016  . Herpes zoster without complication 03/83/3383  . Spondylosis of cervical region without myelopathy or radiculopathy 06/16/2015    Past Surgical History:  Procedure Laterality Date  . ABDOMINAL HYSTERECTOMY    . BLADDER REPAIR    . CESAREAN SECTION    . IR KYPHO LUMBAR INC FX REDUCE BONE BX UNI/BIL CANNULATION INC/IMAGING  04/11/2018  . TUBAL LIGATION       OB History   No obstetric history on file.      Family History  Problem Relation Age of Onset  . Hypertension Mother     Social History   Tobacco Use  . Smoking status: Former Research scientist (life sciences)  . Smokeless tobacco: Never Used  . Tobacco comment: 40 years ago   Substance Use Topics  . Alcohol use: No  . Drug use: No    Home Medications Prior to Admission medications   Medication Sig Start Date End Date Taking? Authorizing Provider  acyclovir (ZOVIRAX) 400 MG tablet TAKE 1 TABLET BY MOUTH TWICE A DAY Patient taking differently: Take 400 mg by mouth 2 (two) times daily.  12/29/19   [name], MD  budesonide (PULMICORT) 0.25 MG/2ML nebulizer solution Take 2 mLs (0.25 mg total) by nebulization 2 (two) times daily. 01/24/20   Swayze, Ava, DO  Calcium Carb-Cholecalciferol (CALCIUM 600-D PO) Take 1 tablet by mouth in the morning and at bedtime.    [provider]  conjugated estrogens (PREMARIN) vaginal cream Place 1 Applicatorful vaginally daily as needed (itching).     [provider]  Cyanocobalamin (VITAMIN B-12 PO) Take 1 tablet by mouth daily.    [provider]  diazepam (VALIUM) 5 MG tablet Take 5 mg by mouth 2 (two) times daily.    [provider]  diclofenac Sodium (VOLTAREN) 1 % GEL Apply 1 application topically daily as needed (pain).    [provider]  diphenoxylate-atropine (LOMOTIL) 2.5-0.025 MG tablet Take 1 tablet by mouth 4 (four) times daily as needed for diarrhea or loose stools.    [provider]  DULoxetine (CYMBALTA) 30 MG capsule TAKE 1 CAPSULE BY MOUTH EVERY DAY Patient taking differently: Take 30 mg by mouth every morning.  09/07/19   [name], MD  DULoxetine (CYMBALTA) 60 MG capsule Take 1 capsule (60 mg total) by mouth at bedtime. 12/14/19   [name], MD  ertapenem [facility]) IVPB Inject 1 g into the vein daily for 9 days. Indication:  ESBL UTI First Dose: Yes Last Day of Therapy:  02/01/2020 Labs - Once weekly:  CBC/D and BMP, Labs - Every  other week:  ESR and CRP Method of administration: Mini-Bag Plus / Gravity Method of administration may be changed at the discretion of home infusion pharmacist based upon assessment of the patient and/or caregiver's ability to self-administer the medication ordered. 01/24/20 02/02/20  Swayze, Ava, DO  HYDROcodone-acetaminophen (NORCO) 10-325 MG tablet Take 1 tablet by mouth every 6 (six) hours as needed for pain. 03/07/18   [provider]  ipratropium-albuterol (DUONEB) 0.5-2.5 (3) MG/3ML SOLN Take 3 mLs by nebulization every 4 (four) hours as needed (For SOB and wheezing). Patient not taking: Reported on 12/06/2019 07/25/19 08/24/19  Dana Allan I, MD  Multiple Vitamins-Minerals (HAIR/SKIN/NAILS/BIOTIN) TABS Take 1 tablet by mouth daily.    [provider]  omega-3 acid ethyl esters (LOVAZA) 1 g capsule Take 1 g by  mouth daily.    [provider]  pantoprazole (PROTONIX) 40 MG tablet Take 1 tablet (40 mg total) by mouth daily. Patient taking differently: Take 40 mg by mouth at bedtime.  10/26/16   Angiulli, Lavon Paganini, PA-C  polyethylene glycol (MIRALAX / GLYCOLAX) packet Take 17 g by mouth daily. Patient taking differently: Take 17 g by mouth daily as needed (constipation).  10/26/16   Angiulli, Lavon Paganini, PA-C  pregabalin (LYRICA) 50 MG capsule Take 100 mg by mouth at bedtime. 11/16/19   [provider]  QUEtiapine (SEROQUEL) 25 MG tablet Take 1 tablet (25 mg total) by mouth at bedtime. 11/16/19   [name], MD  sodium chloride flush (NS) 0.9 % SOLN 10-40 mLs by Intracatheter route every 12 (twelve) hours. 01/24/20   Swayze, Ava, DO  sodium chloride flush (NS) 0.9 % SOLN 10-40 mLs by Intracatheter route as needed (flush). 01/24/20   Swayze, Ava, DO    Allergies    Demerol [meperidine], Percocet [oxycodone-acetaminophen], Amoxicillin-pot clavulanate, and Penicillins  Review of Systems   Review of Systems All systems reviewed and negative, other than as  noted in HPI.  Physical Exam Updated Vital Signs BP 121/84   Pulse 83   Temp 97.7 F (36.5 C) (Oral)   Resp (!) 24   Ht _0  (1.651 m)   Wt 68 kg   SpO2 100%   BMI 24.96 kg/m   Physical Exam Vitals and nursing note reviewed.  Constitutional:      General: She is not in acute distress.    Appearance: She is well-developed.  HENT:     Head: Normocephalic and atraumatic.  Eyes:     General:        Right eye: No discharge.        Left eye: No discharge.     Conjunctiva/sclera: Conjunctivae normal.  Cardiovascular:     Rate and Rhythm: Normal rate and regular rhythm.     Heart sounds: Normal heart sounds. No murmur. No friction rub. No gallop.   Pulmonary:     Effort: Pulmonary effort is normal. No respiratory distress.     Breath sounds: Normal breath sounds.  Abdominal:     General: There is no distension.     Palpations: Abdomen is soft.     Tenderness: There is no abdominal tenderness.  Musculoskeletal:        General: No tenderness.     Cervical back: Neck supple.  Skin:    General: Skin is warm and dry.  Neurological:     Mental Status: She is alert and oriented to person, place, and time.     Comments: CN 2-12 intact. Strength 5/5 b/l u/l ext. Can hold either leg off the bed for several seconds although there is some shaking/bobbing  Psychiatric:        Behavior: Behavior normal.     ED Results / Procedures / Treatments   Labs (all labs ordered are listed, but only abnormal results are displayed) Labs Reviewed  BASIC METABOLIC PANEL - Abnormal; Notable for the following components:      Result Value   Glucose, Bld 104 (*)    BUN 7 (*)    All other components within normal limits  CBC - Abnormal; Notable for the following components:   RBC 3.74 (*)    Hemoglobin 11.4 (*)    MCV 100.5 (*)    All other components within normal limits  SARS CORONAVIRUS 2 (TAT 6-24 HRS)  URINALYSIS,  ROUTINE W REFLEX MICROSCOPIC  CBG MONITORING, ED    EKG EKG  Interpretation  Date/Time:  Monday Feb 01 2020 11:54:03 EDT Ventricular Rate:  84 PR Interval:  160 QRS Duration: 76 QT Interval:  412 QTC Calculation: 486 R Axis:   85 Text Interpretation: Normal sinus rhythm Cannot rule out Anterior infarct , age undetermined Abnormal ECG Confirmed by Virgel Manifold [phone]) on 02/01/2020 7:39:59 PM   Radiology No results found.  Procedures Procedures (including critical care time)  Medications Ordered in ED Medications  sodium chloride flush (NS) 0.9 % injection 3 mL (has no administration in time range)    ED Course  I have reviewed the triage vital signs and the nursing notes.  Pertinent labs & imaging results that were available during my care of the patient were reviewed by me and considered in my medical decision making (see chart for details).    MDM Rules/Calculators/A&P                      74yF with weakness. Mild symmetric LE weakness on exam. Being treated for UTI. UA looks fine today and it doesn't make sense why she would start to develop symptoms when she is essentially finished with her abx at this point. Some back pain but this is chronic and denies significant recent change. I don't think this is an acute CVA. Hx of HSV encephalomyelitis with chronic neurological sequela. Husband reports cognition is about baseline.  Will MRI. Would consult neurology if any significant abnormalities. Otherwise I don't have an obvious explanation for her symptoms. Deconditioning with recent illness? Already some home health assistance. May need rehab. Care signed out to Dr Rex Kras with imaging pending.    Final Clinical Impression(s) / ED Diagnoses Final diagnoses:  Weakness    Rx / DC Orders ED Discharge Orders    None       Virgel Manifold, MD 02/02/20 [phone]

## 2020-02-02 DIAGNOSIS — F0390 Unspecified dementia without behavioral disturbance: Secondary | ICD-10-CM | POA: Diagnosis present

## 2020-02-02 DIAGNOSIS — Z8619 Personal history of other infectious and parasitic diseases: Secondary | ICD-10-CM | POA: Diagnosis not present

## 2020-02-02 DIAGNOSIS — Z79891 Long term (current) use of opiate analgesic: Secondary | ICD-10-CM | POA: Diagnosis not present

## 2020-02-02 DIAGNOSIS — R29898 Other symptoms and signs involving the musculoskeletal system: Secondary | ICD-10-CM | POA: Diagnosis not present

## 2020-02-02 DIAGNOSIS — Z515 Encounter for palliative care: Secondary | ICD-10-CM | POA: Diagnosis present

## 2020-02-02 DIAGNOSIS — D539 Nutritional anemia, unspecified: Secondary | ICD-10-CM | POA: Diagnosis present

## 2020-02-02 DIAGNOSIS — Z8661 Personal history of infections of the central nervous system: Secondary | ICD-10-CM | POA: Diagnosis not present

## 2020-02-02 DIAGNOSIS — N390 Urinary tract infection, site not specified: Secondary | ICD-10-CM | POA: Diagnosis present

## 2020-02-02 DIAGNOSIS — R531 Weakness: Secondary | ICD-10-CM | POA: Diagnosis present

## 2020-02-02 DIAGNOSIS — M549 Dorsalgia, unspecified: Secondary | ICD-10-CM | POA: Diagnosis present

## 2020-02-02 DIAGNOSIS — Z79899 Other long term (current) drug therapy: Secondary | ICD-10-CM | POA: Diagnosis not present

## 2020-02-02 DIAGNOSIS — Z20822 Contact with and (suspected) exposure to covid-19: Secondary | ICD-10-CM | POA: Diagnosis present

## 2020-02-02 DIAGNOSIS — I959 Hypotension, unspecified: Secondary | ICD-10-CM | POA: Diagnosis present

## 2020-02-02 DIAGNOSIS — M4800 Spinal stenosis, site unspecified: Secondary | ICD-10-CM | POA: Diagnosis present

## 2020-02-02 DIAGNOSIS — Z87440 Personal history of urinary (tract) infections: Secondary | ICD-10-CM | POA: Diagnosis not present

## 2020-02-02 DIAGNOSIS — G8929 Other chronic pain: Secondary | ICD-10-CM | POA: Diagnosis present

## 2020-02-02 DIAGNOSIS — K219 Gastro-esophageal reflux disease without esophagitis: Secondary | ICD-10-CM

## 2020-02-02 DIAGNOSIS — F418 Other specified anxiety disorders: Secondary | ICD-10-CM

## 2020-02-02 DIAGNOSIS — N319 Neuromuscular dysfunction of bladder, unspecified: Secondary | ICD-10-CM | POA: Diagnosis present

## 2020-02-02 DIAGNOSIS — Z90710 Acquired absence of both cervix and uterus: Secondary | ICD-10-CM | POA: Diagnosis not present

## 2020-02-02 DIAGNOSIS — R5381 Other malaise: Secondary | ICD-10-CM | POA: Diagnosis present

## 2020-02-02 DIAGNOSIS — Z8249 Family history of ischemic heart disease and other diseases of the circulatory system: Secondary | ICD-10-CM | POA: Diagnosis not present

## 2020-02-02 DIAGNOSIS — Z87891 Personal history of nicotine dependence: Secondary | ICD-10-CM | POA: Diagnosis not present

## 2020-02-02 DIAGNOSIS — Z66 Do not resuscitate: Secondary | ICD-10-CM | POA: Diagnosis present

## 2020-02-02 LAB — C-REACTIVE PROTEIN: CRP: 0.7 mg/dL (ref <1.0)

## 2020-02-02 LAB — SEDIMENTATION RATE: Sed Rate: 28 mm/hr — ABNORMAL HIGH (ref 0–22)

## 2020-02-02 LAB — SARS CORONAVIRUS 2 (TAT 6-24 HRS): SARS Coronavirus 2: NEGATIVE

## 2020-02-02 MED ORDER — PREGABALIN 100 MG PO CAPS
100.0000 mg | ORAL_CAPSULE | Freq: Every day | ORAL | Status: DC
  Administered 2020-02-02 – 2020-02-05 (×4): 100 mg via ORAL
  Filled 2020-02-02 (×4): qty 1

## 2020-02-02 MED ORDER — HYDROCODONE-ACETAMINOPHEN 10-325 MG PO TABS
1.0000 | ORAL_TABLET | Freq: Four times a day (QID) | ORAL | Status: DC | PRN
  Administered 2020-02-02 – 2020-02-03 (×3): 1 via ORAL
  Filled 2020-02-02 (×3): qty 1

## 2020-02-02 MED ORDER — DULOXETINE HCL 30 MG PO CPEP
30.0000 mg | ORAL_CAPSULE | Freq: Every morning | ORAL | Status: DC
  Administered 2020-02-03 – 2020-02-06 (×4): 30 mg via ORAL
  Filled 2020-02-02 (×4): qty 1

## 2020-02-02 MED ORDER — GADOBUTROL 1 MMOL/ML IV SOLN
3.5000 mL | Freq: Once | INTRAVENOUS | Status: AC | PRN
  Administered 2020-02-02: 3.5 mL via INTRAVENOUS

## 2020-02-02 MED ORDER — CHLORHEXIDINE GLUCONATE CLOTH 2 % EX PADS
6.0000 | MEDICATED_PAD | Freq: Every day | CUTANEOUS | Status: DC
  Administered 2020-02-02 – 2020-02-06 (×5): 6 via TOPICAL

## 2020-02-02 MED ORDER — POLYETHYLENE GLYCOL 3350 17 G PO PACK: 17.0000 g | PACK | Freq: Every day | ORAL | Status: DC | PRN

## 2020-02-02 MED ORDER — ACETAMINOPHEN 650 MG RE SUPP: 650.0000 mg | Freq: Four times a day (QID) | RECTAL | Status: DC | PRN

## 2020-02-02 MED ORDER — ALBUTEROL SULFATE (2.5 MG/3ML) 0.083% IN NEBU: 2.5000 mg | INHALATION_SOLUTION | Freq: Four times a day (QID) | RESPIRATORY_TRACT | Status: DC | PRN

## 2020-02-02 MED ORDER — DICLOFENAC SODIUM 1 % EX GEL
1.0000 "application " | Freq: Every day | CUTANEOUS | Status: DC | PRN
  Administered 2020-02-03: 1 via TOPICAL
  Filled 2020-02-02: qty 100

## 2020-02-02 MED ORDER — ACYCLOVIR 400 MG PO TABS
400.0000 mg | ORAL_TABLET | Freq: Two times a day (BID) | ORAL | Status: DC
  Administered 2020-02-02 – 2020-02-06 (×9): 400 mg via ORAL
  Filled 2020-02-02 (×9): qty 1

## 2020-02-02 MED ORDER — SODIUM CHLORIDE 0.9% FLUSH
10.0000 mL | INTRAVENOUS | Status: DC | PRN
  Administered 2020-02-02: 10 mL

## 2020-02-02 MED ORDER — DIPHENOXYLATE-ATROPINE 2.5-0.025 MG PO TABS
1.0000 | ORAL_TABLET | Freq: Four times a day (QID) | ORAL | Status: DC | PRN
  Administered 2020-02-02: 1 via ORAL
  Filled 2020-02-02: qty 1

## 2020-02-02 MED ORDER — QUETIAPINE FUMARATE 50 MG PO TABS
25.0000 mg | ORAL_TABLET | Freq: Every day | ORAL | Status: DC
  Administered 2020-02-02 – 2020-02-05 (×4): 25 mg via ORAL
  Filled 2020-02-02 (×4): qty 1

## 2020-02-02 MED ORDER — PANTOPRAZOLE SODIUM 40 MG PO TBEC
40.0000 mg | DELAYED_RELEASE_TABLET | Freq: Every day | ORAL | Status: DC
  Administered 2020-02-02 – 2020-02-05 (×4): 40 mg via ORAL
  Filled 2020-02-02 (×4): qty 1

## 2020-02-02 MED ORDER — ONDANSETRON HCL 4 MG PO TABS: 4.0000 mg | ORAL_TABLET | Freq: Four times a day (QID) | ORAL | Status: DC | PRN

## 2020-02-02 MED ORDER — ONDANSETRON HCL 4 MG/2ML IJ SOLN: 4.0000 mg | Freq: Four times a day (QID) | INTRAMUSCULAR | Status: DC | PRN

## 2020-02-02 MED ORDER — SODIUM CHLORIDE 0.9% FLUSH
10.0000 mL | Freq: Two times a day (BID) | INTRAVENOUS | Status: DC
  Administered 2020-02-02: 10 mL
  Administered 2020-02-02: 20 mL
  Administered 2020-02-03 – 2020-02-05 (×5): 10 mL

## 2020-02-02 MED ORDER — DULOXETINE HCL 60 MG PO CPEP
60.0000 mg | ORAL_CAPSULE | Freq: Every day | ORAL | Status: DC
  Administered 2020-02-02 – 2020-02-05 (×4): 60 mg via ORAL
  Filled 2020-02-02 (×4): qty 1

## 2020-02-02 MED ORDER — ACETAMINOPHEN 325 MG PO TABS
650.0000 mg | ORAL_TABLET | Freq: Four times a day (QID) | ORAL | Status: DC | PRN
  Filled 2020-02-02: qty 2

## 2020-02-02 MED ORDER — DIAZEPAM 2 MG PO TABS
5.0000 mg | ORAL_TABLET | Freq: Two times a day (BID) | ORAL | Status: DC
  Administered 2020-02-02 – 2020-02-03 (×4): 5 mg via ORAL
  Filled 2020-02-02 (×5): qty 3

## 2020-02-02 MED ORDER — ENOXAPARIN SODIUM 40 MG/0.4ML ~~LOC~~ SOLN
40.0000 mg | SUBCUTANEOUS | Status: DC
  Administered 2020-02-02 – 2020-02-06 (×5): 40 mg via SUBCUTANEOUS
  Filled 2020-02-02 (×5): qty 0.4

## 2020-02-02 MED ORDER — SODIUM CHLORIDE 0.9% FLUSH
3.0000 mL | Freq: Two times a day (BID) | INTRAVENOUS | Status: DC
  Administered 2020-02-02 – 2020-02-05 (×5): 3 mL via INTRAVENOUS

## 2020-02-02 NOTE — ED Provider Notes (Addendum)
I received this patient in signout from Dr. Wilson Singer. Pt had presented w/ LE weakness, recent abx for ESBL UTI and distant hx of myelitis 2/2 HSV meningitis. At time of signout, pending MRI brain, c-spine, and l-spine.   Imaging shows multiple chronic findings but no acute findings requiring intervention. Pt alert and comfortable on reassessment.  It was initially difficult to appreciate what symptoms were new versus chronic when I spoke with the patient, but later her husband arrived and clarified that current inability to walk is very different from her functional status at time of discharge recently.  Nursing staff attempted ambulation and patient completely unable to ambulate, very off balance even with assistance and walker. I discussed w/ neurology, Dr. Rory Percy, who advised that imaging was sufficient neuro w/u at this point. Because she cannot ambulate and this is new problem for her, discussed admission w/ [facility], Dr. Marlowe Sax.     Marlea Gambill, Wenda Overland, MD 02/02/20 0157    Rex Kras Wenda Overland, MD 02/02/20 [phone]

## 2020-02-02 NOTE — Progress Notes (Signed)
Pt admitted to 6n13 from ED at this time. Moved self from stretcher to bed. Denies pain/nausea. Will continue to monitor. Fall risk precautions in place.

## 2020-02-02 NOTE — Progress Notes (Signed)
Ardith Dark notified of arrival to unit.

## 2020-02-02 NOTE — Social Work (Signed)
CSW acknowledging consult for SNF placement. Will follow for therapy recommendations needed to best determine disposition/for insurance authorization.   Greig Altergott, MSW, LCSW [place] Clinical Social Work    

## 2020-02-02 NOTE — Care Management (Signed)
Patient from home with [facility]. Both agencies aware of patient's admission . Await PT eval   Magdalen Spatz RN

## 2020-02-02 NOTE — ED Notes (Signed)
Attempted to ambulate pt in hallway with a walker. Pt very unsteady, almost fell twice with [place] of walker and staff assistance. Little, MD made aware of the same

## 2020-02-02 NOTE — Evaluation (Signed)
Physical Therapy Evaluation Patient Details Name: Katelyn Lamb MRN: [id number] DOB: [birth year] Today's Date: 02/02/2020   History of Present Illness  Pt is a 74 y/o female admitted secondary to increased LE weakness and inability to ambulate. Found to be hypotensive upon admission. PMH includes recent UTI, anxiety, and transverse myelitis.   Clinical Impression  Pt admitted secondary to problem above with deficits below. Pt requiring min A to stand and transfer to [facility] this session. Pt reports she is feeling stronger during session. Feel pt will progress well and be able to d/c home with assist from family and HHPT. However, if pt does not progress, may require ST SNF placement. Will continue to follow acutely to maximize functional mobility independence and safety.     Follow Up Recommendations Home health PT;Supervision/Assistance - 24 hour(pending progression )    Equipment Recommendations  None recommended by PT    Recommendations for Other Services       Precautions / Restrictions Precautions Precautions: Fall Restrictions Weight Bearing Restrictions: No      Mobility  Bed Mobility Overal bed mobility: Needs Assistance Bed Mobility: Supine to Sit;Sit to Supine     Supine to sit: Mod assist Sit to supine: Supervision   General bed mobility comments: Mod A to assist with scooting hips to EOB. Supervision for return to supine.   Transfers Overall transfer level: Needs assistance Equipment used: Rolling walker (2 wheeled) Transfers: Sit to/from [facility] Sit to Stand: Min assist Stand pivot transfers: Min assist       General transfer comment: Min A for lift assist and steadying to stand and transfer to [facility] and back to bed. Cues for sequencing using RW.   Ambulation/Gait                Stairs            Wheelchair Mobility    Modified Rankin (Stroke Patients Only)       Balance Overall balance assessment: Needs  assistance Sitting-balance support: No upper extremity supported;Feet supported Sitting balance-Leahy Scale: Good     Standing balance support: Bilateral upper extremity supported;During functional activity Standing balance-Leahy Scale: Poor Standing balance comment: reliant on UE support                             Pertinent Vitals/Pain Pain Assessment: 0-10 Pain Score: 4  Pain Location: stomach and BLE  Pain Descriptors / Indicators: Aching Pain Intervention(s): Limited activity within patient's tolerance;Monitored during session    Home Living Family/patient expects to be discharged to:: Private residence Living Arrangements: Spouse/significant other Available Help at Discharge: Available 24 hours/day;Family Type of Home: House Home Access: Stairs to enter Entrance Stairs-Rails: None Entrance Stairs-Number of Steps: 2 Home Layout: Two level;Able to live on main level with bedroom/bathroom Home Equipment: Gilford Rile - 2 wheels;Bedside commode;Wheelchair - manual;Tub bench;Grab bars - tub/shower;Cane - quad      Prior Function Level of Independence: Needs assistance   Gait / Transfers Assistance Needed: Used RW for ambulation; needed help with ambulation   ADL's / Homemaking Assistance Needed: husband assists with ADLs as needed        Hand Dominance        Extremity/Trunk Assessment   Upper Extremity Assessment Upper Extremity Assessment: Defer to OT evaluation    Lower Extremity Assessment Lower Extremity Assessment: Generalized weakness    Cervical / Trunk Assessment Cervical / Trunk Assessment: Normal  Communication  Communication: No difficulties  Cognition Arousal/Alertness: Awake/alert Behavior During Therapy: WFL for tasks assessed/performed Overall Cognitive Status: History of cognitive impairments - at baseline                                 General Comments: Memory deficits at baseline. Was A and O X4.       General  Comments General comments (skin integrity, edema, etc.): Pt's husband and daughter present during session     Exercises     Assessment/Plan    PT Assessment Patient needs continued PT services  PT Problem List Decreased strength;Decreased mobility;Decreased knowledge of precautions;Decreased activity tolerance;Decreased balance;Decreased cognition;Decreased safety awareness       PT Treatment Interventions DME instruction;Therapeutic activities;Cognitive remediation;Gait training;Therapeutic exercise;Patient/family education;Balance training;Stair training;Functional mobility training    PT Goals (Current goals can be found in the Care Plan section)  Acute Rehab PT Goals Patient Stated Goal: to go home PT Goal Formulation: With patient/family Time For Goal Achievement: 02/16/20 Potential to Achieve Goals: Good    Frequency Min 3X/week   Barriers to discharge        Co-evaluation               AM-PAC PT "6 Clicks" Mobility  Outcome Measure Help needed turning from your back to your side while in a flat bed without using bedrails?: A Little Help needed moving from lying on your back to sitting on the side of a flat bed without using bedrails?: A Lot Help needed moving to and from a bed to a chair (including a wheelchair)?: A Little Help needed standing up from a chair using your arms (e.g., wheelchair or bedside chair)?: A Little Help needed to walk in hospital room?: A Little Help needed climbing 3-5 steps with a railing? : A Lot 6 Click Score: 16    End of Session Equipment Utilized During Treatment: Gait belt Activity Tolerance: Patient tolerated treatment well Patient left: in bed;with call bell/phone within reach;with bed alarm set;with family/visitor present;with nursing/sitter in room Nurse Communication: Mobility status PT Visit Diagnosis: Unsteadiness on feet (R26.81);Muscle weakness (generalized) (M62.81)    Time: [id number] PT Time Calculation (min)  (ACUTE ONLY): 41 min   Charges:   PT Evaluation $PT Eval Moderate Complexity: 1 Mod PT Treatments $Therapeutic Activity: 23-37 mins        Lou Miner, DPT  Acute Rehabilitation Services  Pager: [phone] Office: [phone]   Rudean Hitt 02/02/2020, 4:01 PM

## 2020-02-02 NOTE — Consult Note (Signed)
Secure chatted Dr. Tamala Julian, will await PICC line removal until patient's disposition date is set.

## 2020-02-02 NOTE — H&P (Addendum)
History and Physical    Katelyn Lamb WCB:[id number] DOB: 12/19/1945 DOA: 02/01/2020  Referring MD/NP/PA: Shela Leff, MD PCP: [name], MD  Outpatient specialists: Dr. Margette Fast (neurology)  Patient coming from: Home   Chief Complaint: Weakness and pain  I have personally briefly reviewed patient's old medical records in [place]   HPI: Katelyn Lamb is a 74 y.o. 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  Patient provides history as well as help from her daughter who is present at bedside.  She reports having severe pain in both of her ankles and knees which caused her to be unable to walk.  She lives at home with her husband and reportedly almost fell, but her husband was able to her down to the floor.  They called EMS and they were able to help her get up.  Patient had been being treated for a ESBL urinary tract infection through a PICC line with ertapenem, but completed her last treatment yesterday.  Denied having any fever, dysuria, cough, shortness of breath, abdominal pain, nausea, vomiting, diarrhea, or change in appetite.  En route with EMS patient's systolic blood pressure were noted to be in the 80s with sitting.  ED Course: On admission into the emergency department patient was seen to be afebrile, respirations 13 and 24, blood pressure 90/63-121/84, and all other vital signs maintained.  Labs appeared near patient's baseline.  Urinalysis negative for any signs of infection.  MRI of the brain showed no acute abnormality and chronic small vessel disease.  MRI of the cervical spine reported to be unchanged with severe spinal cord stenosis at C5-6 with moderate bilateral stenosis C4-5, and C6-7 neural foraminal stenosis.  Review of Systems    Constitutional: Negative for fever and malaise/fatigue.  HENT: Negative for congestion and sore throat.   Eyes: Negative for photophobia and pain.  Respiratory: Negative for cough and shortness of breath.   Cardiovascular: Negative for chest pain and leg swelling.  Gastrointestinal: Negative for abdominal pain, diarrhea, nausea and vomiting.  Genitourinary: Negative for dysuria and hematuria.  Musculoskeletal: Positive for falls, joint pain and myalgias.  Skin: Negative for rash.  Neurological: Positive for weakness. Negative for focal weakness.  Endo/Heme/Allergies: Negative for polydipsia.  Psychiatric/Behavioral: Positive for memory loss. Negative for substance abuse.    Past Medical History:  Diagnosis Date  . Anxiety   . Back pain   . Gait abnormality 11/14/2016  . Memory difficulty 03/02/2019  . Myelitis due to herpes simplex [facility])     Past Surgical History:  Procedure Laterality Date  . ABDOMINAL HYSTERECTOMY    . BLADDER REPAIR    . CESAREAN SECTION    . IR KYPHO LUMBAR INC FX REDUCE BONE BX UNI/BIL CANNULATION INC/IMAGING  04/11/2018  . TUBAL LIGATION       reports that she has quit smoking. She has never used smokeless tobacco. She reports that she does not drink alcohol or use drugs.  Allergies  Allergen Reactions  . Demerol [Meperidine] Other (See Comments)    Hallucinations  . Percocet [Oxycodone-Acetaminophen] Itching  . Amoxicillin-Pot Clavulanate Diarrhea    Severe pain, headache, intestinal infection  . Penicillins Itching and Rash    Has patient had a PCN reaction causing immediate rash, facial/tongue/throat swelling, SOB or lightheadedness with hypotension:  NO Has patient  had a PCN reaction causing severe rash involving mucus membranes or skin necrosis: No Has patient had a PCN reaction that required hospitalization: No Has patient had a PCN reaction occurring within the last 10 years: Yes If all of the above answers are "NO", then may proceed with  Cephalosporin use.    Family History  Problem Relation Age of Onset  . Hypertension Mother     Prior to Admission medications   Medication Sig Start Date End Date Taking? Authorizing Provider  acyclovir (ZOVIRAX) 400 MG tablet TAKE 1 TABLET BY MOUTH TWICE A DAY Patient taking differently: Take 400 mg by mouth 2 (two) times daily.  12/29/19   [name], MD  budesonide (PULMICORT) 0.25 MG/2ML nebulizer solution Take 2 mLs (0.25 mg total) by nebulization 2 (two) times daily. 01/24/20   Swayze, Ava, DO  Calcium Carb-Cholecalciferol (CALCIUM 600-D PO) Take 1 tablet by mouth in the morning and at bedtime.    [provider]  conjugated estrogens (PREMARIN) vaginal cream Place 1 Applicatorful vaginally daily as needed (itching).     [provider]  Cyanocobalamin (VITAMIN B-12 PO) Take 1 tablet by mouth daily.    [provider]  diazepam (VALIUM) 5 MG tablet Take 5 mg by mouth 2 (two) times daily.    [provider]  diclofenac Sodium (VOLTAREN) 1 % GEL Apply 1 application topically daily as needed (pain).    [provider]  diphenoxylate-atropine (LOMOTIL) 2.5-0.025 MG tablet Take 1 tablet by mouth 4 (four) times daily as needed for diarrhea or loose stools.    [provider]  DULoxetine (CYMBALTA) 30 MG capsule TAKE 1 CAPSULE BY MOUTH EVERY DAY Patient taking differently: Take 30 mg by mouth every morning.  09/07/19   [name], MD  DULoxetine (CYMBALTA) 60 MG capsule Take 1 capsule (60 mg total) by mouth at bedtime. 12/14/19   [name], MD  ertapenem [facility]) IVPB Inject 1 g into the vein daily for 9 days. Indication:  ESBL UTI First Dose: Yes Last Day of Therapy:  02/01/2020 Labs - Once weekly:  CBC/D and BMP, Labs - Every other week:  ESR and CRP Method of administration: Mini-Bag Plus / Gravity Method of administration may be changed at the discretion of home infusion pharmacist based upon assessment of the  patient and/or caregiver's ability to self-administer the medication ordered. 01/24/20 02/02/20  Swayze, Ava, DO  HYDROcodone-acetaminophen (NORCO) 10-325 MG tablet Take 1 tablet by mouth every 6 (six) hours as needed for pain. 03/07/18   [provider]  ipratropium-albuterol (DUONEB) 0.5-2.5 (3) MG/3ML SOLN Take 3 mLs by nebulization every 4 (four) hours as needed (For SOB and wheezing). Patient not taking: Reported on 12/06/2019 07/25/19 08/24/19  Dana Allan I, MD  Multiple Vitamins-Minerals (HAIR/SKIN/NAILS/BIOTIN) TABS Take 1 tablet by mouth daily.    [provider]  omega-3 acid ethyl esters (LOVAZA) 1 g capsule Take 1 g by mouth daily.    [provider]  pantoprazole (PROTONIX) 40 MG tablet Take 1 tablet (40 mg total) by mouth daily. Patient taking differently: Take 40 mg by mouth at bedtime.  10/26/16   Angiulli, Lavon Paganini, PA-C  polyethylene glycol (MIRALAX / GLYCOLAX) packet Take 17 g by mouth daily. Patient taking differently: Take 17 g by mouth daily as needed (constipation).  10/26/16   Angiulli, Lavon Paganini, PA-C  pregabalin (LYRICA) 50 MG capsule Take 100 mg by mouth at bedtime. 11/16/19   [provider]  QUEtiapine (  SEROQUEL) 25 MG tablet Take 1 tablet (25 mg total) by mouth at bedtime. 11/16/19   [name], MD  sodium chloride flush (NS) 0.9 % SOLN 10-40 mLs by Intracatheter route every 12 (twelve) hours. 01/24/20   Swayze, Ava, DO  sodium chloride flush (NS) 0.9 % SOLN 10-40 mLs by Intracatheter route as needed (flush). 01/24/20   Swayze, Ava, DO    Physical Exam:  Constitutional: Elderly female currently in NAD, calm, comfortable Vitals:   02/02/20 0130 02/02/20 0354 02/02/20 0451 02/02/20 0522  BP: 114/76 (!) 101/54  102/75  Pulse: 89 87  81  Resp: (!) _0 Temp:   98.2 F (36.8 C) (!) 97.5 F (36.4 C)  TempSrc:   Axillary Oral  SpO2: 95% 99%  96%  Weight:      Height:       Eyes: PERRL, lids and conjunctivae normal ENMT:  Mucous membranes are moist. Posterior pharynx clear of any exudate or lesions.  Neck: normal, supple, no masses, no thyromegaly Respiratory: clear to auscultation bilaterally, no wheezing, no crackles. Normal respiratory effort. No accessory muscle use.  Cardiovascular: Regular rate and rhythm, no murmurs / rubs / gallops. No extremity edema. 2+ pedal pulses. No carotid bruits.  PICC line present the right upper extremity without signs of erythema. Abdomen: no tenderness, no masses palpated. No hepatosplenomegaly. Bowel sounds positive.  Musculoskeletal: no clubbing / cyanosis.  Decreased range of motion of the lumbar spine.  Decreased range of motion of most notably of the left lower extremity due to pain. Skin: no rashes, lesions, ulcers. No induration  Neurologic: CN 2-12 grossly intact.  Patient able to move all 4 extremities without any issues.Marland Kitchen  Psychiatric: Normal judgment and insight. Alert and oriented x 3. Normal mood.     Labs on Admission: I have personally reviewed following labs and imaging studies  CBC: Recent Labs  Lab 02/01/20 1158  WBC 7.5  HGB 11.4*  HCT 37.6  MCV 100.5*  PLT 917   Basic Metabolic Panel: Recent Labs  Lab 02/01/20 1158  NA 139  K 3.5  CL 101  CO2 28  GLUCOSE 104*  BUN 7*  CREATININE 0.86  CALCIUM 9.0   GFR: Estimated Creatinine Clearance: 53.2 mL/min (by C-G formula based on SCr of 0.86 mg/dL). Liver Function Tests: No results for input(s): AST, ALT, ALKPHOS, BILITOT, PROT, ALBUMIN in the last 168 hours. No results for input(s): LIPASE, AMYLASE in the last 168 hours. No results for input(s): AMMONIA in the last 168 hours. Coagulation Profile: No results for input(s): INR, PROTIME in the last 168 hours. Cardiac Enzymes: No results for input(s): CKTOTAL, CKMB, CKMBINDEX, TROPONINI in the last 168 hours. BNP (last 3 results) No results for input(s): PROBNP in the last 8760 hours. HbA1C: No results for input(s): HGBA1C in the last 72  hours. CBG: Recent Labs  Lab 02/01/20 1647  GLUCAP 93   Lipid Profile: No results for input(s): CHOL, HDL, LDLCALC, TRIG, CHOLHDL, LDLDIRECT in the last 72 hours. Thyroid Function Tests: No results for input(s): TSH, T4TOTAL, FREET4, T3FREE, THYROIDAB in the last 72 hours. Anemia Panel: No results for input(s): VITAMINB12, FOLATE, FERRITIN, TIBC, IRON, RETICCTPCT in the last 72 hours. Urine analysis:    Component Value Date/Time   COLORURINE YELLOW 02/01/2020 1934   APPEARANCEUR CLEAR 02/01/2020 1934   LABSPEC 1.005 02/01/2020 1934   PHURINE 7.0 02/01/2020 1934   GLUCOSEU NEGATIVE 02/01/2020 1934   HGBUR NEGATIVE 02/01/2020 1934   BILIRUBINUR  NEGATIVE 02/01/2020 [place] 02/01/2020 1934   PROTEINUR NEGATIVE 02/01/2020 1934   NITRITE NEGATIVE 02/01/2020 1934   LEUKOCYTESUR NEGATIVE 02/01/2020 1934   Sepsis Labs: Recent Results (from the past 240 hour(s))  SARS CORONAVIRUS 2 (TAT 6-24 HRS) Nasopharyngeal Nasopharyngeal Swab     Status: None   Collection Time: 02/02/20  1:43 AM   Specimen: Nasopharyngeal Swab  Result Value Ref Range Status   SARS Coronavirus 2 NEGATIVE NEGATIVE Final    Comment: (NOTE) SARS-CoV-2 target nucleic acids are NOT DETECTED. The SARS-CoV-2 RNA is generally detectable in upper and lower respiratory specimens during the acute phase of infection. Negative results do not preclude SARS-CoV-2 infection, do not rule out co-infections with other pathogens, and should not be used as the sole basis for treatment or other patient management decisions. Negative results must be combined with clinical observations, patient history, and epidemiological information. The expected result is Negative. Fact Sheet for Patients: SugarRoll.be Fact Sheet for Healthcare Providers: https://www.woods-mathews.com/ This test is not yet approved or cleared by the [place] FDA and  has been authorized for  detection and/or diagnosis of SARS-CoV-2 by FDA under an Emergency Use Authorization (EUA). This EUA will remain  in effect (meaning this test can be used) for the duration of the COVID-19 declaration under Section 56 4(b)(1) of the Act, 21 U.S.C. section 360bbb-3(b)(1), unless the authorization is terminated or revoked sooner. Performed at [facility], [place] [address], [place], [place] [address]      Radiological Exams on Admission: MR BRAIN WO CONTRAST  Result Date: 02/02/2020 CLINICAL DATA:  Encephalopathy EXAM: MRI HEAD WITHOUT CONTRAST TECHNIQUE: Multiplanar, multiecho pulse sequences of the brain and surrounding structures were obtained without intravenous contrast. COMPARISON:  03/30/2019 FINDINGS: BRAIN: No acute infarct, acute hemorrhage or extra-axial collection. Early confluent hyperintense T2-weighted signal of the periventricular and deep white matter, most commonly due to chronic ischemic microangiopathy. There is generalized atrophy without lobar predilection. No chronic microhemorrhage. Normal midline structures. VASCULAR: Major flow voids are preserved. SKULL AND UPPER CERVICAL SPINE: Normal calvarium and skull base. Visualized upper cervical spine and soft tissues are normal. SINUSES/ORBITS: No paranasal sinus fluid levels or advanced mucosal thickening. No mastoid or middle ear effusion. Normal orbits. IMPRESSION: 1. No acute intracranial abnormality. 2. Moderate chronic small vessel disease. Electronically Signed   By: Ulyses Jarred M.D.   On: 02/02/2020 00:46   MR Cervical Spine W or Wo Contrast  Result Date: 02/02/2020 CLINICAL DATA:  History of transverse myelitis. Progressive lower extremity weakness. EXAM: MRI CERVICAL SPINE WITHOUT AND WITH CONTRAST TECHNIQUE: Multiplanar and multiecho pulse sequences of the cervical spine, to include the craniocervical junction and cervicothoracic junction, were obtained without and with intravenous contrast. CONTRAST:  3.48m GADAVIST  GADOBUTROL 1 MMOL/ML IV SOLN COMPARISON:  Cervical spine MRI 03/14/2018 FINDINGS: Alignment: Reversal of normal cervical lordosis may be positional or due to muscle spasm. Vertebrae: T2 M angioma.  No acute fracture. Cord: Hyperintense T2-weighted signal within the dorsal spinal cord at the C3-4 levels. This is unchanged from the prior study. Posterior Fossa, vertebral arteries, paraspinal tissues: Negative. Disc levels: C1-2: Unremarkable. C2-3: Normal disc space and facet joints. There is no spinal canal stenosis. No neural foraminal stenosis. C3-4: Small disc osteophyte complex, unchanged. There is no spinal canal stenosis. No neural foraminal stenosis. C4-5: Unchanged intermediate disc osteophyte complex. Unchanged mild spinal canal stenosis. Unchanged moderate bilateral neural foraminal stenosis. C5-6: Unchanged central disc protrusion with endplate spurring indenting the ventral  aspect of the spinal cord. Severe spinal canal stenosis. Unchanged severe bilateral neural foraminal stenosis. C6-7: Intermediate disc bulge. Unchanged mild spinal canal stenosis. Unchanged moderate neural foraminal stenosis. C7-T1: Normal disc space and facet joints. There is no spinal canal stenosis. No neural foraminal stenosis. IMPRESSION: 1. Unchanged examination with severe spinal canal stenosis at C5-6 with indentation of the ventral aspect of the spinal cord. 2. Hyperintense T2-weighted signal within the dorsal spinal cord at the C3-4 levels, unchanged from the prior study and likely a sequela of transverse myelitis. 3. Unchanged mild spinal canal stenosis at C4-5 and C6-7. 4. Unchanged severe bilateral C5-6 and moderate bilateral C4-5 and C6-7 neural foraminal stenosis. Electronically Signed   By: Ulyses Jarred M.D.   On: 02/02/2020 01:37   MR Lumbar Spine W Wo Contrast  Result Date: 02/02/2020 CLINICAL DATA:  Lower extremity weakness and low back pain EXAM: MRI LUMBAR SPINE WITHOUT AND WITH CONTRAST TECHNIQUE: Multiplanar  and multiecho pulse sequences of the lumbar spine were obtained without and with intravenous contrast. CONTRAST:  3.86m GADAVIST GADOBUTROL 1 MMOL/ML IV SOLN COMPARISON:  02/26/2008 FINDINGS: Segmentation:  Standard Alignment: Grade 1 anterolisthesis at L4-5 due to severe facet arthrosis Vertebrae: Sclerotic areas within the L2 and L5 vertebrae compatible prior vertebral augmentation. No acute fracture. No discitis-osteomyelitis. Conus medullaris and cauda equina: Conus extends to the L1 level. Conus and cauda equina appear normal. Paraspinal and other soft tissues: Negative Disc levels: T12-L1: Small disc bulge.  No stenosis. L1-L2: Partial involution of left subarticular disc protrusion. There is no spinal canal stenosis. No neural foraminal stenosis. L2-L3: Normal disc space and facet joints. There is no spinal canal stenosis. No neural foraminal stenosis. L3-L4: Moderate facet hypertrophy with mild disc bulge. Left lateral recess narrowing without central spinal canal stenosis. No neural foraminal stenosis. L4-L5: Severe facet hypertrophy with intermediate sized disc uncovering. Mild spinal canal stenosis. Moderate bilateral neural foraminal stenosis. L5-S1: Left asymmetric disc bulge. There is no spinal canal stenosis. No neural foraminal stenosis. Visualized sacrum: Normal. IMPRESSION: 1. Mild spinal canal stenosis and moderate bilateral neural foraminal stenosis at L4-L5 secondary to grade 1 anterolisthesis caused by severe facet arthrosis. 2. Left lateral recess narrowing at L3-L4. Correlate for L4 radiculopathy. 3. Partial involution of left subarticular disc protrusion at L1-L2. No associated stenosis. Electronically Signed   By: KUlyses JarredM.D.   On: 02/02/2020 01:23    EKG: Independently reviewed.  Sinus rhythm at 84 bpm  Assessment/Plan Lower extremity weakness, spinal stenosis: Acute on chronic.  Patient reports having severe pain in her bilateral ankles and knees.  Imaging studies significant  for several areas of spinal stenosis. -Admit to a MedSurg bed -Check vitamin B12, TSH, ESR, CRP -PT/OT consulted -Transitions of care consult for likely need placement  -Neurosurgery consulted,  will follow-up for further recommendation  Chronic pain: Patient has a history of chronic lower pain with prior kyphoplasty.  Home medications include hydrocodone as needed for pain, Lyrica, and Voltaren gel. -Continue current regimen -Palliative care consult for symptom management  History of HSV encephalitis in 2017: MRI images were evaluated by neurology, but felt symptoms not likely related to liver HSV. -Continue acyclovir  Macrocytic anemia: Chronic.  On admission hemoglobin 11.4 with MCV 100.5. -Continue to monitor  Anxiety and depression -Continue Cymbalta and Valium   Recent history of ESBL UT, neurogenic bladder I: Patient completed course ertapenem through PICC line on 5/31.  Repeat urinalysis negative for any acute abnormalities. -Remove PICC line.  Discussed with  infectious disease  GERD -Continue Protonix  DVT prophylaxis: Lovenox Code Status: Full(discussed previous DNR status with patient and she requested to be a full code) Family Communication: Daughter updated at bedside Disposition Plan: To be determined Consults called: Palliative care Admission status: Inpatient Norval Morton MD [facility] Pager [phone]   If 7PM-7AM, please contact night-coverage [email or web address] Password [email or web address]  02/02/2020, 9:06 AM

## 2020-02-02 NOTE — TOC Initial Note (Signed)
Transition of Care [facility]) - Initial/Assessment Note    Patient Details  Name: Katelyn Lamb MRN: [id number] Date of Birth: [birth year]  Transition of Care [facility]) CM/SW Contact:    Marilu Favre, RN Phone Number: 02/02/2020, 2:08 PM  Clinical Narrative:                 Spoke to PT rec HHPT.   Spoke to patient and daughter at bedside they are in agreement for home health . Patient active with [facility] and [facility] and wish to continue with same agencies. Both agencies aware. Will need home health orders for resumption of care.   Patient from home with husband, has walker, 3 in 1 and shower seat.   Expected Discharge Plan: [place] Barriers to Discharge: Continued Medical Work up   Patient Goals and CMS Choice Patient states their goals for this hospitalization and ongoing recovery are:: to return to home CMS Medicare.gov Compare Post Acute Care list provided to:: Patient Choice offered to / list presented to : Patient, Adult Children  Expected Discharge Plan and Services Expected Discharge Plan: [place]   Discharge Planning Services: CM Consult Post Acute Care Choice: [place] arrangements for the past 2 months: Single Family Home                 DME Arranged: N/A DME Agency: NA       HH Arranged: PT, OT, RN [place] Agency: [facility] Date [place]: 02/02/20 Time [place]: 27 Representative spoke with at [place] I Arrangements/Services Living arrangements for the past 2 months: [place] with:: Spouse Patient language and need for interpreter reviewed:: Yes Do you feel safe going back to the place where you live?: Yes      Need for Family Participation in Patient Care: Yes (Comment) Care giver support system in place?: Yes (comment) Current home services: DME, Home RN, Home PT, Home OT Criminal Activity/Legal Involvement  Pertinent to Current Situation/Hospitalization: No - Comment as needed  Activities of Daily Living      Permission Sought/Granted   Permission granted to share information with : Yes, Verbal Permission Granted  Share Information with NAME: [name]  Permission granted to share info w AGENCY: [facility], [facility]        Emotional Assessment Appearance:: Appears stated age Attitude/Demeanor/Rapport: Engaged Affect (typically observed): Accepting Orientation: : Oriented to Self, Oriented to Place, Oriented to  Time, Oriented to Situation Alcohol / Substance Use: Not Applicable Psych Involvement: No (comment)  Admission diagnosis:  Weakness [R53.1] Lower extremity weakness [R29.898] Patient Active Problem List   Diagnosis Date Noted  . Lower extremity weakness 02/02/2020  . History of recurrent UTI (urinary tract infection)   . Bacterial infection due to Klebsiella pneumoniae   . Laceration of left hand 12/09/2019  . DNR (do not resuscitate) 12/09/2019  . Pelvic fracture ([place]) 12/06/2019  . AKI (acute kidney injury) ([place]) 12/06/2019  . Severe sepsis ([place]) 07/23/2019  . UTI (urinary tract infection) 07/23/2019  . Hematuria 07/23/2019  . Acute respiratory failure with hypoxia ([place]) 07/23/2019  . Hypokalemia 07/23/2019  . Acute on chronic anemia 07/23/2019  . Fever 07/23/2019  . Leukocytosis 07/23/2019  . Sepsis ([place]) 07/23/2019  . Generalized anxiety disorder 06/17/2019  . Menopausal sweats 06/17/2019  . Memory difficulty 03/02/2019  . Degenerative spondylolisthesis 09/02/2018  . Delirium 03/26/2018  . SIRS (systemic  inflammatory response syndrome) ([place]) 03/26/2018  . 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 Degeneration of lumbar intervertebral disc 11/08/2017  . Lumbar radiculopathy 11/06/2017  . Chronic neck pain 09/12/2017  . Chronic low back pain 09/06/2017  . Chronic pain syndrome 09/06/2017  . Dilated pancreatic duct  01/24/2017  . DVT, lower extremity, distal, chronic ([place]) 01/22/2017  . Elevated serum GGT level 01/22/2017  . Medication monitoring encounter 01/08/2017  . Gait abnormality 11/14/2016  . Hypotension due to drugs   . Urinary retention   . Anxiety about health   . Reactive depression   . Ataxia   . Abdominal spasms   . Constipation due to pain medication   . Acute lower UTI   . Dysuria   . Acute deep vein thrombosis (DVT) of popliteal vein of left lower extremity ([place])   . Incomplete paraplegia ([place])   . Acute blood loss anemia   . Neurogenic bladder   . Neuropathic pain   . Muscle spasm   . Gastroesophageal reflux disease   . Slow transit constipation   . Thrombocytopenia ([place]) 10/03/2016  . Abnormal MRI, spinal cord   . Encephalomyelitis   . Numbness   . Intractable back pain 09/20/2016  . Numbness of left lower extremity 09/20/2016  . Hyponatremia 09/20/2016  . Herpes zoster without complication 0000000  . Spondylosis of cervical region without myelopathy or radiculopathy 06/16/2015   PCP:  [name], MD Pharmacy:   [id number] - [place], [place] [place] [address] Phone: [phone] Fax: [phone]     Social Determinants of Health (SDOH) Interventions    Readmission Risk Interventions No flowsheet data found.

## 2020-02-03 DIAGNOSIS — R262 Difficulty in walking, not elsewhere classified: Secondary | ICD-10-CM

## 2020-02-03 DIAGNOSIS — N319 Neuromuscular dysfunction of bladder, unspecified: Secondary | ICD-10-CM

## 2020-02-03 LAB — BASIC METABOLIC PANEL
Anion gap: 10 (ref 5–15)
BUN: <5 mg/dL — ABNORMAL LOW (ref 8–23)
CO2: 27 mmol/L (ref 22–32)
Calcium: 8.7 mg/dL — ABNORMAL LOW (ref 8.9–10.3)
Chloride: 103 mmol/L (ref 98–111)
Creatinine, Ser: 0.72 mg/dL (ref 0.44–1.00)
GFR calc Af Amer: >60 mL/min (ref >60)
GFR calc non Af Amer: >60 mL/min (ref >60)
Glucose, Bld: 93 mg/dL (ref 70–99)
Potassium: 3.8 mmol/L (ref 3.5–5.1)
Sodium: 140 mmol/L (ref 135–145)

## 2020-02-03 LAB — CBC
HCT: 36.3 % (ref 36.0–46.0)
Hemoglobin: 11.1 g/dL — ABNORMAL LOW (ref 12.0–15.0)
MCH: 29.8 pg (ref 26.0–34.0)
MCHC: 30.6 g/dL (ref 30.0–36.0)
MCV: 97.6 fL (ref 80.0–100.0)
Platelets: 270 10*3/uL (ref 150–400)
RBC: 3.72 MIL/uL — ABNORMAL LOW (ref 3.87–5.11)
RDW: 13.3 % (ref 11.5–15.5)
WBC: 7.8 10*3/uL (ref 4.0–10.5)
nRBC: 0 % (ref 0.0–0.2)

## 2020-02-03 NOTE — NC FL2 (Signed)
[place] LEVEL OF CARE SCREENING TOOL     IDENTIFICATION  Patient Name: Katelyn Lamb Birthdate: [birth year] Sex: female Admission Date (Current Location): 02/01/2020  [facility] and [place] Number:  Herbalist and Address:  [facility], [place] [address] [place], [place] [address]      Provider Number: [id number]  Attending Physician Name and Address:  Karie Kirks, DO  Relative Name and Phone Number:       Current Level of Care: Hospital Recommended Level of Care: [place] Prior Approval Number:    Date Approved/Denied:   PASRR Number: [id number]  Discharge Plan: SNF    Current Diagnoses: Patient Active Problem List   Diagnosis Date Noted  . Lower extremity weakness 02/02/2020  . Macrocytic anemia 02/02/2020  . Spinal stenosis 02/02/2020  . History of recurrent UTI (urinary tract infection)   . Bacterial infection due to Klebsiella pneumoniae   . Laceration of left hand 12/09/2019  . DNR (do not resuscitate) 12/09/2019  . Pelvic fracture ([place]) 12/06/2019  . AKI (acute kidney injury) ([place]) 12/06/2019  . Severe sepsis ([place]) 07/23/2019  . UTI (urinary tract infection) 07/23/2019  . Hematuria 07/23/2019  . Acute respiratory failure with hypoxia ([place]) 07/23/2019  . Hypokalemia 07/23/2019  . Acute on chronic anemia 07/23/2019  . Fever 07/23/2019  . Leukocytosis 07/23/2019  . Sepsis ([place]) 07/23/2019  . Generalized anxiety disorder 06/17/2019  . Menopausal sweats 06/17/2019  . Memory difficulty 03/02/2019  . Degenerative spondylolisthesis 09/02/2018  . Delirium 03/26/2018  . SIRS (systemic inflammatory response syndrome) ([place]) 03/26/2018  . 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 Degeneration of lumbar intervertebral disc 11/08/2017  . Lumbar radiculopathy 11/06/2017  . Chronic neck pain 09/12/2017  . Chronic low back pain 09/06/2017  . Chronic pain syndrome 09/06/2017  .  Dilated pancreatic duct 01/24/2017  . DVT, lower extremity, distal, chronic ([place]) 01/22/2017  . Elevated serum GGT level 01/22/2017  . Medication monitoring encounter 01/08/2017  . Gait abnormality 11/14/2016  . Hypotension due to drugs   . Urinary retention   . Anxiety about health   . Reactive depression   . Ataxia   . Abdominal spasms   . Constipation due to pain medication   . Acute lower UTI   . Dysuria   . Acute deep vein thrombosis (DVT) of popliteal vein of left lower extremity ([place])   . Incomplete paraplegia ([place])   . Acute blood loss anemia   . Neurogenic bladder   . Neuropathic pain   . Muscle spasm   . Gastroesophageal reflux disease   . Slow transit constipation   . Thrombocytopenia ([place]) 10/03/2016  . Abnormal MRI, spinal cord   . Encephalomyelitis   . Numbness   . Intractable back pain 09/20/2016  . Numbness of left lower extremity 09/20/2016  . Hyponatremia 09/20/2016  . Herpes zoster without complication 0000000  . Spondylosis of cervical region without myelopathy or radiculopathy 06/16/2015    Orientation RESPIRATION BLADDER Height & Weight     Self, Time, Situation, Place  Normal   Weight: 150 lb (68 kg) Height:  5\' 5"  (165.1 cm)  BEHAVIORAL SYMPTOMS/MOOD NEUROLOGICAL BOWEL NUTRITION STATUS        Diet(see discharge summary)  AMBULATORY STATUS COMMUNICATION OF NEEDS Skin   Extensive Assist Verbally                         Personal  Care Assistance Level of Assistance  Bathing, Feeding, Dressing Bathing Assistance: Limited assistance Feeding assistance: Independent Dressing Assistance: Limited assistance     Functional Limitations Info  Sight, Hearing, Speech Sight Info: Adequate Hearing Info: Adequate Speech Info: Adequate    SPECIAL CARE FACTORS FREQUENCY  PT (By licensed PT), OT (By licensed OT)     PT Frequency: 5x week OT Frequency: 5x week            Contractures Contractures Info: Not present    Additional Factors  Info  Code Status, Allergies, Isolation Precautions, Psychotropic Code Status Info: Full Code Allergies Info: Demerol (Meperidine), Percocet (Oxycodone-acetaminophen), Amoxicillin-pot Clavulanate, Penicillins Psychotropic Info: diazepam (VALIUM) tablet 5 mg 2x daily PO; DULoxetine (CYMBALTA) DR capsule 30 mg every morning 10am; DULoxetine (CYMBALTA) DR capsule 60 mg daily at bedtime; QUEtiapine (SEROQUEL) tablet 25 mg daily at bedtime   Isolation Precautions Info: ESBL     Current Medications (02/03/2020):  This is the current hospital active medication list Current Facility-Administered Medications  Medication Dose Route Frequency Provider Last Rate Last Admin  . acetaminophen (TYLENOL) tablet 650 mg  650 mg Oral Q6H PRN Fuller Plan A, MD       Or  . acetaminophen (TYLENOL) suppository 650 mg  650 mg Rectal Q6H PRN Fuller Plan A, MD      . acyclovir (ZOVIRAX) tablet 400 mg  400 mg Oral BID Fuller Plan A, MD   400 mg at 02/03/20 0937  . albuterol (PROVENTIL) (2.5 MG/3ML) 0.083% nebulizer solution 2.5 mg  2.5 mg Nebulization Q6H PRN Fuller Plan A, MD      . Chlorhexidine Gluconate Cloth 2 % PADS 6 each  6 each Topical Daily Norval Morton, MD   6 each at 02/03/20 [id number]  . diazepam (VALIUM) tablet 5 mg  5 mg Oral BID Fuller Plan A, MD   5 mg at 02/03/20 0937  . diclofenac Sodium (VOLTAREN) 1 % topical gel 1 application  1 application Topical Daily PRN Fuller Plan A, MD      . diphenoxylate-atropine (LOMOTIL) 2.5-0.025 MG per tablet 1 tablet  1 tablet Oral QID PRN Norval Morton, MD   1 tablet at 02/02/20 1348  . DULoxetine (CYMBALTA) DR capsule 30 mg  30 mg Oral q morning - 10a Fuller Plan A, MD   30 mg at 02/03/20 0937  . DULoxetine (CYMBALTA) DR capsule 60 mg  60 mg Oral QHS Fuller Plan A, MD   60 mg at 02/02/20 2137  . enoxaparin (LOVENOX) injection 40 mg  40 mg Subcutaneous Q24H Fuller Plan A, MD   40 mg at 02/03/20 0937  . HYDROcodone-acetaminophen (NORCO) 10-325  MG per tablet 1 tablet  1 tablet Oral Q6H PRN Norval Morton, MD   1 tablet at 02/03/20 1254  . ondansetron (ZOFRAN) tablet 4 mg  4 mg Oral Q6H PRN Fuller Plan A, MD       Or  . ondansetron (ZOFRAN) injection 4 mg  4 mg Intravenous Q6H PRN Smith, Rondell A, MD      . pantoprazole (PROTONIX) EC tablet 40 mg  40 mg Oral QHS Smith, Rondell A, MD   40 mg at 02/02/20 2137  . polyethylene glycol (MIRALAX / GLYCOLAX) packet 17 g  17 g Oral Daily PRN Tamala Julian, Rondell A, MD      . pregabalin (LYRICA) capsule 100 mg  100 mg Oral QHS Smith, Rondell A, MD   100 mg at 02/02/20 2136  . QUEtiapine (SEROQUEL) tablet  25 mg  25 mg Oral QHS Fuller Plan A, MD   25 mg at 02/02/20 2136  . sodium chloride flush (NS) 0.9 % injection 10-40 mL  10-40 mL Intracatheter Q12H Smith, Rondell A, MD   10 mL at 02/02/20 2138  . sodium chloride flush (NS) 0.9 % injection 10-40 mL  10-40 mL Intracatheter PRN Fuller Plan A, MD   10 mL at 02/02/20 1712  . sodium chloride flush (NS) 0.9 % injection 3 mL  3 mL Intravenous Q12H Smith, Rondell A, MD   3 mL at 02/02/20 1005     Discharge Medications: Please see discharge summary for a list of discharge medications.  Relevant Imaging Results:  Relevant Lab Results:   Additional Information SS#[id number]  [place], [place]

## 2020-02-03 NOTE — Progress Notes (Signed)
Occupational Therapy Evaluation Patient Details Name: Katelyn Lamb MRN: [id number] DOB: 09/11/1945 Today's Date: 02/03/2020    History of Present Illness Pt is a 74 y/o female admitted secondary to increased LE weakness and inability to ambulate. Found to be hypotensive upon admission. PMH includes recent UTI, anxiety, and transverse myelitis.    Clinical Impression   Pt slightly poor historian secondary to baseline cognitive deficits. Prior to hospitalization, pt was living with her husband in a 2-story house with 2 steps to enter and 1st floor living access. Pt required supervision-min assist from husband for BADLs and utilized a RW for ambulation throughout the house. Pt was dependent on husband for IADLs and received some assistance from a [place]. Pt admitted for above and limited by decreased memory, decreased strength, decreased balance, and decreased activity tolerance. Today, pt semi-reclined in bed, soiled in urine. CNA present to assist with bed linen/pt clean up. Assessed bed mobility, functional transfers, toileting, sponge bathing, dressing, and grooming. Pt requires min-mod assist for bed mobility, min assist for functional transfers, supervision for UB bathing, mod assist for LB bathing, setup for dressing and grooming in seated position. Pt requires mod verbal/visual cues for sequencing functional tasks and extended time for mental processing. Pt would benefit from continued skilled OT services to address ADLs and functional transfers. Recommending SNF secondary to cognitive deficits and decreased balance/activity tolerance. Will continue to follow acutely as able.       Follow Up Recommendations  SNF;Supervision/Assistance - 24 hour;Other (comment)(secondary to safety concerns and cog deficits at baseline)    Equipment Recommendations  None recommended by OT;Other (comment)(defer to SNF)    Recommendations for Other Services       Precautions / Restrictions  Precautions Precautions: Fall Precaution Comments: watch sats Restrictions Weight Bearing Restrictions: No      Mobility Bed Mobility Overal bed mobility: Needs Assistance Bed Mobility: Supine to Sit;Sit to Supine Rolling: Min assist   Supine to sit: Mod assist     General bed mobility comments: Pt with HOB elevated required assistance to advance LEs to edge of bed and elevate trunk into a seated position. She presents with posterior pelvic tilt and posterior bias.  Transfers Overall transfer level: Needs assistance Equipment used: Rolling walker (2 wheeled) Transfers: Sit to/from [facility] Sit to Stand: Mod assist;+2 physical assistance Stand pivot transfers: Min assist       General transfer comment: Pt with lift assistance to rise into standing required +2 from bed and +1 mod from recliner chair.  Pt with posterior bias in standing.    Balance Overall balance assessment: Needs assistance Sitting-balance support: No upper extremity supported;Feet supported Sitting balance-Leahy Scale: Good   Postural control: Posterior lean Standing balance support: Bilateral upper extremity supported;During functional activity Standing balance-Leahy Scale: Poor Standing balance comment: reliant on UE support                           ADL either performed or assessed with clinical judgement   ADL Overall ADL's : Needs assistance/impaired Eating/Feeding: Set up;Sitting   Grooming: Wash/dry hands;Wash/dry face;Brushing hair;Set up;Sitting   Upper Body Bathing: Set up;Cueing for sequencing;Sitting Upper Body Bathing Details (indicate cue type and reason): v/c's for thoroughness while sitting  Lower Body Bathing: Moderate assistance;Cueing for safety;Sitting/lateral leans;Sit to/from stand Lower Body Bathing Details (indicate cue type and reason): pt able to perform perineal in standing with min assist for balance; pt required max assist for  perianal care  in standing with min assist for balance using RW for support Upper Body Dressing : Set up;Sitting   Lower Body Dressing: Moderate assistance;Cueing for safety;Cueing for sequencing;Sitting/lateral leans;Sit to/from stand Lower Body Dressing Details (indicate cue type and reason): required assistance with threading BLEs through underwear and pulling over hips once standing with min assist at waistline for balance Toilet Transfer: Minimal assistance;Cueing for safety;Cueing for sequencing;Stand-pivot;BSC;RW Toilet Transfer Details (indicate cue type and reason): pt transferred from bed to [facility] with min assist for balance and v/c's for sequencing/hand placement using RW for support Toileting- Clothing Manipulation and Hygiene: Moderate assistance;Cueing for safety;Sitting/lateral lean;Sit to/from stand Toileting - Clothing Manipulation Details (indicate cue type and reason): supervision for perineal care in sitting; min assist at waistline for balance for perineal care in standing; max assist in standing for perianal care; mod assist for LB dressing management Tub/ Shower Transfer: Minimal assistance;Cueing for safety;Cueing for sequencing;Stand-pivot;Shower seat;Grab bars;Rolling walker   Functional mobility during ADLs: Minimal assistance;Moderate assistance;+2 for safety/equipment;Rolling walker(mostly just focused on sit>stand and stand pivot transfer) General ADL Comments: pt requires min-max assist overall for BADLs secondary to decreased balance and sequencing skills     Vision Baseline Vision/History: (reports wearing contacts typically) Patient Visual Report: No change from baseline       Perception     Praxis      Pertinent Vitals/Pain Pain Assessment: 0-10 Pain Score: 7  Pain Location: lower back Pain Descriptors / Indicators: Aching;Dull;Grimacing Pain Intervention(s): Heat applied;Limited activity within patient's tolerance;Patient requesting pain meds-RN notified;Repositioned      Hand Dominance Right   Extremity/Trunk Assessment Upper Extremity Assessment Upper Extremity Assessment: Overall WFL for tasks assessed;Generalized weakness   Lower Extremity Assessment Lower Extremity Assessment: Defer to PT evaluation   Cervical / Trunk Assessment Cervical / Trunk Assessment: Normal   Communication Communication Communication: No difficulties   Cognition Arousal/Alertness: Awake/alert Behavior During Therapy: WFL for tasks assessed/performed Overall Cognitive Status: History of cognitive impairments - at baseline Area of Impairment: Orientation;Attention;Memory;Safety/judgement;Following commands;Problem solving;Awareness                 Orientation Level: Disoriented to;Time;Situation Current Attention Level: Selective Memory: Decreased short-term memory Following Commands: Follows one step commands with increased time Safety/Judgement: Decreased awareness of safety;Decreased awareness of deficits Awareness: Emergent Problem Solving: Slow processing;Difficulty sequencing;Requires verbal cues General Comments: memory deficits at baseline; was oriented to person/place/month but not year today   General Comments  poor safety awareness and decreased balance; extended time needed for mental processing    Exercises     Shoulder Instructions      Home Living Family/patient expects to be discharged to:: Private residence Living Arrangements: Spouse/significant other Available Help at Discharge: Available 24 hours/day;Family Type of Home: House Home Access: Stairs to enter [employer] of Steps: 2 Entrance Stairs-Rails: None Home Layout: Two level;Able to live on main level with bedroom/bathroom Alternate Level Stairs-Number of Steps: 10   Bathroom Shower/Tub: Teacher, early years/pre: Standard     Home Equipment: Wheelchair - [employer] - 2 wheels;Tub bench;Grab bars - toilet;Grab bars - tub/shower;Bedside commode    Additional Comments: spouse is very supportive      Prior Functioning/Environment Level of Independence: Needs assistance  Gait / Transfers Assistance Needed: Used RW for ambulation; needed help with ambulation  ADL's / Homemaking Assistance Needed: husband assists with ADLs as needed            OT Problem List: Decreased strength;Decreased activity tolerance;Impaired balance (sitting  and/or standing);Decreased cognition;Decreased safety awareness;Pain      OT Treatment/Interventions: Self-care/ADL training;Therapeutic exercise;Energy conservation;DME and/or AE instruction;Therapeutic activities    OT Goals(Current goals can be found in the care plan section) Acute Rehab OT Goals Patient Stated Goal: to go home  OT Frequency: Min 2X/week   Barriers to D/C:            Co-evaluation              AM-PAC OT "6 Clicks" Daily Activity     Outcome Measure Help from another person eating meals?: None Help from another person taking care of personal grooming?: A Little Help from another person toileting, which includes using toliet, bedpan, or urinal?: A Lot Help from another person bathing (including washing, rinsing, drying)?: A Lot Help from another person to put on and taking off regular upper body clothing?: None Help from another person to put on and taking off regular lower body clothing?: A Lot 6 Click Score: 17   End of Session Equipment Utilized During Treatment: Gait belt;Rolling walker Nurse Communication: Mobility status  Activity Tolerance: Patient tolerated treatment well;Patient limited by fatigue Patient left: in chair;with call bell/phone within reach;with chair alarm set;with nursing/sitter in room  OT Visit Diagnosis: Unsteadiness on feet (R26.81);Muscle weakness (generalized) (M62.81);Other symptoms and signs involving cognitive function                Time: 1020-1103 OT Time Calculation (min): 43 min Charges:  OT General Charges $OT Visit: 1  Visit OT Evaluation $OT Eval Moderate Complexity: 1 Mod OT Treatments $Self Care/Home Management : 23-37 mins  Michel Bickers, OTR/L [facility] Acute Rehab Services [phone]   Francesca Jewett 02/03/2020, 4:36 PM

## 2020-02-03 NOTE — TOC Progression Note (Addendum)
Transition of Care [facility]) - Progression Note    Patient Details  Name: Katelyn Lamb MRN: [id number] Date of Birth: November 01, 1945  [facility] CM/SW Contact  Jacalyn Lefevre Edson Snowball, RN Phone Number: 02/03/2020, 1:53 PM  Clinical Narrative:     Re discussed disposition with patient, daughter and husband at bedside.   They are interested in short term rehab at SNF at discharge. They realize TOC team will submit for  insurance  authorization. Their preference is to return to [place] . They do NOT want [facility]. [facility] does not have a bed, patient and husband aware, will fax to other SNF's, will not fax to [facility].  Husband stated if only low rated SNF's offer he will take patient home at discharge. TOC team has submitted for insurance authorization.  Husband , daughter and patient aware Aimee from PT will be working with her again this afternoon.   Expected Discharge Plan: [place] Barriers to Discharge: Continued Medical Work up  Expected Discharge Plan and Services Expected Discharge Plan: [place]   Discharge Planning Services: CM Consult Post Acute Care Choice: [place] arrangements for the past 2 months: Single Family Home                 DME Arranged: N/A DME Agency: NA       HH Arranged: PT, OT, RN [place] Agency: [facility] Date [place]: 02/02/20 Time [place]: [id number] Representative spoke with at [place]: Cassie   Social Determinants of Health ([place]) Interventions    Readmission Risk Interventions No flowsheet data found.

## 2020-02-03 NOTE — Progress Notes (Addendum)
Physical Therapy Treatment Patient Details Name: Katelyn Lamb MRN: [id number] DOB: July 22, 1946 Today's Date: 02/03/2020    History of Present Illness Pt is a 74 y/o female admitted secondary to increased LE weakness and inability to ambulate. Found to be hypotensive upon admission. PMH includes recent UTI, anxiety, and transverse myelitis.     PT Comments    Pt supine in bed on arrival.  Husband present at bedside and very supportive.  Based on assessment of functional mobility will inform supervising PT of need for change in recommendations at this time.  Pt presents with poor base and decreased functional capacity.  She is requiring min to moderate external assistance and +2 for close chair follow due to safety.  Will continue to follow acutely.    Orthostatic vitals: Lying:100/67 Sitting:100/79 Standing:90/58     Follow Up Recommendations  Supervision/Assistance - 24 hour;SNF     Equipment Recommendations  None recommended by PT    Recommendations for Other Services       Precautions / Restrictions Precautions Precautions: Fall Precaution Comments: watch sats Restrictions Weight Bearing Restrictions: No    Mobility  Bed Mobility Overal bed mobility: Needs Assistance Bed Mobility: Supine to Sit;Sit to Supine     Supine to sit: Mod assist     General bed mobility comments: Pt with HOB elevated required assistance to advance LEs to edge of bed and elevate trunk into a seated position. She presents with posterior pelvic tilt and posterior bias.  Transfers Overall transfer level: Needs assistance Equipment used: Rolling walker (2 wheeled) Transfers: Sit to/from [facility] Sit to Stand: Mod assist;+2 physical assistance Stand pivot transfers: Min assist       General transfer comment: Pt with lift assistance to rise into standing required +2 from bed and +1 mod from recliner chair.  Pt with posterior bias in  standing.  Ambulation/Gait Ambulation/Gait assistance: Mod assist;Min assist Gait Distance (Feet): 4 Feet(+ 25 ft) Assistive device: Rolling walker (2 wheeled) Gait Pattern/deviations: Step-through pattern;Decreased stride length     General Gait Details: Pt initially required moderate assistance with poor balance and poor ability to turn.  LOB during pivot.  Sat patient in recliner and she performed additional trial with straight path and close chair follow.  Toward end of gt knees became shaky and she required increased assistance.   Stairs             Wheelchair Mobility    Modified Rankin (Stroke Patients Only)       Balance Overall balance assessment: Needs assistance Sitting-balance support: No upper extremity supported;Feet supported Sitting balance-Leahy Scale: Good   Postural control: Posterior lean   Standing balance-Leahy Scale: Poor Standing balance comment: reliant on UE support                            Cognition Arousal/Alertness: Awake/alert Behavior During Therapy: WFL for tasks assessed/performed Overall Cognitive Status: History of cognitive impairments - at baseline Area of Impairment: Orientation;Attention;Memory;Safety/judgement;Following commands;Problem solving;Awareness                 Orientation Level: Disoriented to;Time;Situation Current Attention Level: Selective Memory: Decreased short-term memory Following Commands: Follows one step commands with increased time Safety/Judgement: Decreased awareness of safety;Decreased awareness of deficits Awareness: Emergent Problem Solving: Slow processing;Difficulty sequencing;Requires verbal cues General Comments: memory deficits at baseline; was oriented to person/place/month but not year today      Exercises      General  Comments        Pertinent Vitals/Pain Pain Assessment: 0-10 Pain Score: 7  Pain Location: lower back Pain Descriptors / Indicators:  Aching;Dull;Grimacing Pain Intervention(s): Heat applied;Limited activity within patient's tolerance;Patient requesting pain meds-RN notified;Repositioned    Home Living                      Prior Function            PT Goals (current goals can now be found in the care plan section) Acute Rehab PT Goals Patient Stated Goal: to go home Potential to Achieve Goals: Good Progress towards PT goals: Progressing toward goals    Frequency    Min 5X/week(until snf is confirmed)      PT Plan Current plan remains appropriate    Co-evaluation              AM-PAC PT "6 Clicks" Mobility   Outcome Measure  Help needed turning from your back to your side while in a flat bed without using bedrails?: A Lot Help needed moving from lying on your back to sitting on the side of a flat bed without using bedrails?: A Lot Help needed moving to and from a bed to a chair (including a wheelchair)?: A Lot Help needed standing up from a chair using your arms (e.g., wheelchair or bedside chair)?: A Lot Help needed to walk in hospital room?: A Lot Help needed climbing 3-5 steps with a railing? : Total 6 Click Score: 11    End of Session Equipment Utilized During Treatment: Gait belt Activity Tolerance: Patient tolerated treatment well Patient left: with call bell/phone within reach;with family/visitor present;with nursing/sitter in room;in chair;with chair alarm set Nurse Communication: Mobility status PT Visit Diagnosis: Unsteadiness on feet (R26.81);Muscle weakness (generalized) (M62.81)     Time: [id number] PT Time Calculation (min) (ACUTE ONLY): 29 min  Charges:  $Gait Training: 8-22 mins $Therapeutic Activity: 8-22 mins                     Erasmo Leventhal , PTA Acute Rehabilitation Services Pager [phone] Office [phone]     Loukas Antonson Eli Hose 02/03/2020, 5:42 PM

## 2020-02-03 NOTE — Progress Notes (Addendum)
Patient family brought her in yesterday basically because she was unable to ambulate.  Normally can ambulate with use of a walker.  Daughter noted that they had concern with her father's ability to care for her at home if unable to ambulate.  She was noted to be afebrile and after neurology evaluated MRI images they felt there was no concern for HSV encephalitis or need of LP.  Case had been discussed with Dr. Shelba Flake of neurosurgery who informed me that patient is actually followed by Dr. Rolena Infante of [facility] and should be the person evaluating the patient if needed in the hospital.

## 2020-02-03 NOTE — Consult Note (Addendum)
Patient ID: Katelyn Lamb MRN: [id number] DOB/AGE: 09-Dec-1945 74 y.o.  Admit date: 02/01/2020  Admission Diagnoses:  Principal Problem:   Lower extremity weakness Active Problems:   Gastroesophageal reflux disease   Anxiety about health   Macrocytic anemia   Spinal stenosis   HPI: [name] is a pleasant 74 yo female with Hx of transverse myelitis (followed by Dr. Jannifer Franklin, neurologist as outpatient and Dr. Nelva Bush for medical pain management) who was recently hospitalized for 5 days for a UTI, she was discharged home, but after about 1 week of being home she began experiencing progressive LE weakness without any known injury. This started Friday and she was brought to the hospital on Monday. At baseline the patient ambulates with a walker, but over the last few days she has been unable to ambulate with a walker as her knees give out. Even with a family member assisting her with a belt the patient is unable to mobilize to do normal ADLs to care for herself (go to bathroom, etc). Patient complains of bilateral nerve type pain and wrestless legs, worse at night. This is not new for her.The patient did have some frequency/leakage with her recent UTI, no significant bladder or bowel incontinence or saddle anaesthesia.   Past Medical History: Past Medical History:  Diagnosis Date  . Anxiety   . Back pain   . Gait abnormality 11/14/2016  . Memory difficulty 03/02/2019  . Myelitis due to herpes simplex [facility])     Surgical History: Past Surgical History:  Procedure Laterality Date  . ABDOMINAL HYSTERECTOMY    . BLADDER REPAIR    . CESAREAN SECTION    . IR KYPHO LUMBAR INC FX REDUCE BONE BX UNI/BIL CANNULATION INC/IMAGING  04/11/2018  . TUBAL LIGATION      Family History: Family History  Problem Relation Age of Onset  . Hypertension Mother     Social History: Social History   Socioeconomic History  . Marital status: Married    Spouse name: [name]  . Number of children: 2  . Years of  education: 85  . Highest education level: Not on file  Occupational History  . Not on file  Tobacco Use  . Smoking status: Former Research scientist (life sciences)  . Smokeless tobacco: Never Used  . Tobacco comment: 40 years ago   Substance and Sexual Activity  . Alcohol use: No  . Drug use: No  . Sexual activity: Not Currently  Other Topics Concern  . Not on file  Social History Narrative   Lives with husband   Caffeine use: Green tea   Soda ocass   Coffee rare   Right-handed   Social Determinants of Health   Financial Resource Strain:   . Difficulty of Paying Living Expenses:   Food Insecurity:   . Worried About Charity fundraiser in the Last Year:   . Arboriculturist in the Last Year:   Transportation Needs:   . Film/video editor (Medical):   Marland Kitchen Lack of Transportation (Non-Medical):   Physical Activity:   . Days of Exercise per Week:   . Minutes of Exercise per Session:   Stress:   . Feeling of Stress :   Social Connections:   . Frequency of Communication with Friends and Family:   . Frequency of Social Gatherings with Friends and Family:   . Attends Religious Services:   . Active Member of Clubs or Organizations:   . Attends Archivist Meetings:   Marland Kitchen Marital Status:  Intimate Partner Violence:   . Fear of Current or Ex-Partner:   . Emotionally Abused:   Marland Kitchen Physically Abused:   . Sexually Abused:     Allergies: Demerol [meperidine], Percocet [oxycodone-acetaminophen], Amoxicillin-pot clavulanate, and Penicillins  Medications: I have reviewed the patient's current medications.  Vital Signs: Patient Vitals for the past 24 hrs:  BP Temp Temp src Pulse Resp SpO2  02/03/20 1456 102/66 100 F (37.8 C) Oral 100 17 96 %  02/03/20 0415 104/71 98.6 F (37 C) Oral 89 16 95 %  02/02/20 2000 98/69 (!) 97.5 F (36.4 C) Oral 84 -- 94 %    Radiology: MR BRAIN WO CONTRAST  Result Date: 02/02/2020 CLINICAL DATA:  Encephalopathy EXAM: MRI HEAD WITHOUT CONTRAST TECHNIQUE:  Multiplanar, multiecho pulse sequences of the brain and surrounding structures were obtained without intravenous contrast. COMPARISON:  03/30/2019 FINDINGS: BRAIN: No acute infarct, acute hemorrhage or extra-axial collection. Early confluent hyperintense T2-weighted signal of the periventricular and deep white matter, most commonly due to chronic ischemic microangiopathy. There is generalized atrophy without lobar predilection. No chronic microhemorrhage. Normal midline structures. VASCULAR: Major flow voids are preserved. SKULL AND UPPER CERVICAL SPINE: Normal calvarium and skull base. Visualized upper cervical spine and soft tissues are normal. SINUSES/ORBITS: No paranasal sinus fluid levels or advanced mucosal thickening. No mastoid or middle ear effusion. Normal orbits. IMPRESSION: 1. No acute intracranial abnormality. 2. Moderate chronic small vessel disease. Electronically Signed   By: Ulyses Jarred M.D.   On: 02/02/2020 00:46   MR Cervical Spine W or Wo Contrast  Result Date: 02/02/2020 CLINICAL DATA:  History of transverse myelitis. Progressive lower extremity weakness. EXAM: MRI CERVICAL SPINE WITHOUT AND WITH CONTRAST TECHNIQUE: Multiplanar and multiecho pulse sequences of the cervical spine, to include the craniocervical junction and cervicothoracic junction, were obtained without and with intravenous contrast. CONTRAST:  3.7mL GADAVIST GADOBUTROL 1 MMOL/ML IV SOLN COMPARISON:  Cervical spine MRI 03/14/2018 FINDINGS: Alignment: Reversal of normal cervical lordosis may be positional or due to muscle spasm. Vertebrae: T2 M angioma.  No acute fracture. Cord: Hyperintense T2-weighted signal within the dorsal spinal cord at the C3-4 levels. This is unchanged from the prior study. Posterior Fossa, vertebral arteries, paraspinal tissues: Negative. Disc levels: C1-2: Unremarkable. C2-3: Normal disc space and facet joints. There is no spinal canal stenosis. No neural foraminal stenosis. C3-4: Small disc  osteophyte complex, unchanged. There is no spinal canal stenosis. No neural foraminal stenosis. C4-5: Unchanged intermediate disc osteophyte complex. Unchanged mild spinal canal stenosis. Unchanged moderate bilateral neural foraminal stenosis. C5-6: Unchanged central disc protrusion with endplate spurring indenting the ventral aspect of the spinal cord. Severe spinal canal stenosis. Unchanged severe bilateral neural foraminal stenosis. C6-7: Intermediate disc bulge. Unchanged mild spinal canal stenosis. Unchanged moderate neural foraminal stenosis. C7-T1: Normal disc space and facet joints. There is no spinal canal stenosis. No neural foraminal stenosis. IMPRESSION: 1. Unchanged examination with severe spinal canal stenosis at C5-6 with indentation of the ventral aspect of the spinal cord. 2. Hyperintense T2-weighted signal within the dorsal spinal cord at the C3-4 levels, unchanged from the prior study and likely a sequela of transverse myelitis. 3. Unchanged mild spinal canal stenosis at C4-5 and C6-7. 4. Unchanged severe bilateral C5-6 and moderate bilateral C4-5 and C6-7 neural foraminal stenosis. Electronically Signed   By: Ulyses Jarred M.D.   On: 02/02/2020 01:37   MR Lumbar Spine W Wo Contrast  Result Date: 02/02/2020 CLINICAL DATA:  Lower extremity weakness and low back pain EXAM: MRI  LUMBAR SPINE WITHOUT AND WITH CONTRAST TECHNIQUE: Multiplanar and multiecho pulse sequences of the lumbar spine were obtained without and with intravenous contrast. CONTRAST:  3.67mL GADAVIST GADOBUTROL 1 MMOL/ML IV SOLN COMPARISON:  02/26/2008 FINDINGS: Segmentation:  Standard Alignment: Grade 1 anterolisthesis at L4-5 due to severe facet arthrosis Vertebrae: Sclerotic areas within the L2 and L5 vertebrae compatible prior vertebral augmentation. No acute fracture. No discitis-osteomyelitis. Conus medullaris and cauda equina: Conus extends to the L1 level. Conus and cauda equina appear normal. Paraspinal and other soft  tissues: Negative Disc levels: T12-L1: Small disc bulge.  No stenosis. L1-L2: Partial involution of left subarticular disc protrusion. There is no spinal canal stenosis. No neural foraminal stenosis. L2-L3: Normal disc space and facet joints. There is no spinal canal stenosis. No neural foraminal stenosis. L3-L4: Moderate facet hypertrophy with mild disc bulge. Left lateral recess narrowing without central spinal canal stenosis. No neural foraminal stenosis. L4-L5: Severe facet hypertrophy with intermediate sized disc uncovering. Mild spinal canal stenosis. Moderate bilateral neural foraminal stenosis. L5-S1: Left asymmetric disc bulge. There is no spinal canal stenosis. No neural foraminal stenosis. Visualized sacrum: Normal. IMPRESSION: 1. Mild spinal canal stenosis and moderate bilateral neural foraminal stenosis at L4-L5 secondary to grade 1 anterolisthesis caused by severe facet arthrosis. 2. Left lateral recess narrowing at L3-L4. Correlate for L4 radiculopathy. 3. Partial involution of left subarticular disc protrusion at L1-L2. No associated stenosis. Electronically Signed   By: Ulyses Jarred M.D.   On: 02/02/2020 01:23   US RENAL  Result Date: 01/20/2020 CLINICAL DATA:  74 year old female with acute renal injury. EXAM: RENAL / URINARY TRACT ULTRASOUND COMPLETE COMPARISON:  CT Abdomen and Pelvis 07/23/2019. FINDINGS: Right Kidney: Renal measurements: 8.6 x 5.0 x 4.3 cm = volume: 96 mild mL . Echogenicity within normal limits. Thinning of the cortex (image 6). No mass or hydronephrosis visualized. Left Kidney: Renal measurements: 8.4 x 5.0 x 4.3 cm = volume: 95 mL. Echogenicity within normal limits. Thinning of the cortex (image 19). No mass or hydronephrosis visualized. Bladder: Appears normal for degree of bladder distention. Other: None. IMPRESSION: No acute renal finding.  Some renal cortical atrophy is evident. Electronically Signed   By: Genevie Ann M.D.   On: 01/20/2020 15:14   DG Chest Port 1  View  Result Date: 01/20/2020 CLINICAL DATA:  Shortness of breath. Weakness, fever and somnolence. On antibiotics for urinary tract infection. EXAM: PORTABLE CHEST 1 VIEW COMPARISON:  Radiographs 12/06/2019 and 07/13/2019. CT 07/23/2019. FINDINGS: 1126 hours. The heart size and mediastinal contours are stable. There are lower lung volumes with increased streaky bibasilar pulmonary opacities, radiographically most consistent with atelectasis. No consolidation, edema, pleural effusion or pneumothorax. The bones appear unchanged. Telemetry leads overlie the chest. IMPRESSION: Lower lung volumes with increased streaky bibasilar pulmonary opacities, radiographically consistent with atelectasis. No consolidation or pleural effusion. Electronically Signed   By: Richardean Sale M.D.   On: 01/20/2020 11:38   [place] EKG SITE RITE  Result Date: 01/23/2020 If [facility] image not attached, placement could not be confirmed due to current cardiac rhythm.   Labs: Recent Labs    02/01/20 1158 02/03/20 0449  WBC 7.5 7.8  RBC 3.74* 3.72*  HCT 37.6 36.3  PLT 271 270   Recent Labs    02/01/20 1158 02/03/20 0449  NA 139 140  K 3.5 3.8  CL 101 103  CO2 28 27  BUN 7* <5*  CREATININE 0.86 0.72  GLUCOSE 104* 93  CALCIUM 9.0 8.7*   No  results for input(s): LABPT, INR in the last 72 hours.  Review of Systems: ROS as stated in HPI  Physical Exam: Body mass index is 24.96 kg/m.  General: AAOx3. NAD. Lying in bed comfortably with family member at side.  Inspection: No obvious deformity of spine. Ne lesions/masses noted.  Palpation: Non-tender over midline spine.   AROM: Paitient UE ROM including shoulder shrug, shoulder abduction, elbow flexion, elbow extension, wrist extension, finger abduction and finger squeeze normal and pain free bilaterally. LE ROM limited as patient is in bed. Normal, pain free hip flexion, knee flexion/extension, ankle plantar and dorsi flexion.  Neuro: Normals sensation to  light touch in the upper and lower extremities bilaterally. 5/5 UE neur strength bilaterally with the exception of bilateral hand grip strength. Which the patient states is her baseline. Negative Hoffmans. LE motor strength is limited as the patient is in bed/ not tested with weight bearing. Appears to have trace weakness of hip flexors and quads bilaterally ?Deconditioning rather true neuro deficit? 5/5 ankle dorsi and plantar flexion bilaterally. Negative SLR. Negative babinski.  PV: LE warm and well profused.     Assessment and Plan: MRI of cervical unchanged compared to previous studies.   No significant radicular pain or obvious motor deficit that correlates with a specific myotome/dermatome on exam that would indicate lumbar spine cause for new weakness. No severe stenosis or nerve compression in lumbar spine.   Patient discussed with Dr. Rolena Infante (my attending) who will see patient tomorrow.   Recommend neurology consult for additional work up.    Amanda Ward PA-C [facility]  Patient was seen and evaluated this morning.  I agree with the findings as stated by my PA.  Patient continues to have generalized lower extremity weakness and is somewhat confused.  She rates intermittent lower extremity pain with passive range of motion.  I reviewed the MRI of her cervical, thoracic, and lumbar MRI.  I agree with the neurologist that there is no significant change from her prior cervical or lumbar MRI.  There is no formal MRI report for the thoracic spine as of yet but I do not see any significant cord compression.  There is a slight signal change in the lower thoracic spine question hemangioma.  At this point time I do not think surgical intervention is advisable.  There is no significant structural abnormality that would account for her new lower extremity weakness.  Furthermore, I do not feel from a medical standpoint she is a good surgical candidate.  At this point I recommend that we obtain  new x-rays of her pelvis to ensure that the recent pelvic fracture is not changed.  Patient should be scheduled for follow-up with my partner Dr.Olin for ongoing treatment and management of the pelvic fracture.  At this point there is nothing further I can add to her care and so I will sign off.  If there is any other questions or concerns please not hesitate to contact me.  I will defer management to the neurology team.  Recommend follow-up with Dr. Rock Nephew her treating neurologist.

## 2020-02-03 NOTE — Consult Note (Signed)
Neurology Consultation  Reason for Consult: Progressive lower extremity weakness bilaterally Referring Physician: Dr. Karie Kirks, [facility]  CC: Difficulty walking, worsening lower extremity weakness  History is obtained from: Patient, chart-no family at bedside  HPI: Katelyn Lamb is a 74 y.o. female past medical history of HSV encephalomyelitis, chronic back pain status post kyphoplasty, progressive gait disorder neurogenic bladder secondary to transverse myelitis, recurrent UTIs with recent ESBL presenting for evaluation of difficulty walking. Imaging from the ER-MRI brain, C-spine and lumbar spine noted no new findings. However the patient reports that she has been having more difficulty walking than prior.  Review of her chart reveals that she had been having progressive memory deficits and gait deficits ongoing at least for the past 2 to 3 years.  She is not a great historian-but she does mention that the difficulty with walking is her knees buckling and giving out and having lot of burning pain in her inner thighs and knees. She does not report of any urinary symptoms at this time.  No abdominal pain nausea vomiting chest pain shortness of breath. She was seen by ID-Dr. Graylon Good in the clinic, was notably orthostatic, and was sent for further evaluation to the ER. She did remain somewhat hypotensive and questionably orthostatic in the hospital as well. She is a patient of Dr. Jannifer Franklin at [facility] neurology. According to the chart review, her gait-which is with walker assistance is baseline has deteriorated to a point where she requires more than 2 person assist at this time. She complains that this is new and also noted that the burning in her legs is severely worse than prior. Of note, she also reports that she had a fall around Easter where she fell from a stool and had a possible superimposed left pubic bone and superior pubic ramus fracture on top of an old left inferior pubic ramus  fracture.  ROS: Performed and is negative except noted in the HPI  Past Medical History:  Diagnosis Date  . Anxiety   . Back pain   . Gait abnormality 11/14/2016  . Memory difficulty 03/02/2019  . Myelitis due to herpes simplex ([place])     Family History  Problem Relation Age of Onset  . Hypertension Mother     Social History:   reports that she has quit smoking. She has never used smokeless tobacco. She reports that she does not drink alcohol or use drugs.  Medications  Current Facility-Administered Medications:  .  acetaminophen (TYLENOL) tablet 650 mg, 650 mg, Oral, Q6H PRN **OR** acetaminophen (TYLENOL) suppository 650 mg, 650 mg, Rectal, Q6H PRN, Smith, Rondell A, MD .  acyclovir (ZOVIRAX) tablet 400 mg, 400 mg, Oral, BID, Smith, Rondell A, MD, 400 mg at 02/03/20 0937 .  albuterol (PROVENTIL) (2.5 MG/3ML) 0.083% nebulizer solution 2.5 mg, 2.5 mg, Nebulization, Q6H PRN, Tamala Julian, Rondell A, MD .  Chlorhexidine Gluconate Cloth 2 % PADS 6 each, 6 each, Topical, Daily, Norval Morton, MD, 6 each at 02/03/20 [id number] .  diazepam (VALIUM) tablet 5 mg, 5 mg, Oral, BID, Smith, Rondell A, MD, 5 mg at 02/03/20 0937 .  diclofenac Sodium (VOLTAREN) 1 % topical gel 1 application, 1 application, Topical, Daily PRN, Norval Morton, MD, 1 application at 0000000 1612 .  diphenoxylate-atropine (LOMOTIL) 2.5-0.025 MG per tablet 1 tablet, 1 tablet, Oral, QID PRN, Fuller Plan A, MD, 1 tablet at 02/02/20 1348 .  DULoxetine (CYMBALTA) DR capsule 30 mg, 30 mg, Oral, q morning - 10a, Norval Morton, MD,  30 mg at 02/03/20 0937 .  DULoxetine (CYMBALTA) DR capsule 60 mg, 60 mg, Oral, QHS, Smith, Rondell A, MD, 60 mg at 02/02/20 2137 .  enoxaparin (LOVENOX) injection 40 mg, 40 mg, Subcutaneous, Q24H, Smith, Rondell A, MD, 40 mg at 02/03/20 0937 .  HYDROcodone-acetaminophen (NORCO) 10-325 MG per tablet 1 tablet, 1 tablet, Oral, Q6H PRN, Fuller Plan A, MD, 1 tablet at 02/03/20 1254 .  ondansetron (ZOFRAN)  tablet 4 mg, 4 mg, Oral, Q6H PRN **OR** ondansetron (ZOFRAN) injection 4 mg, 4 mg, Intravenous, Q6H PRN, Smith, Rondell A, MD .  pantoprazole (PROTONIX) EC tablet 40 mg, 40 mg, Oral, QHS, Smith, Rondell A, MD, 40 mg at 02/02/20 2137 .  polyethylene glycol (MIRALAX / GLYCOLAX) packet 17 g, 17 g, Oral, Daily PRN, Smith, Rondell A, MD .  pregabalin (LYRICA) capsule 100 mg, 100 mg, Oral, QHS, Smith, Rondell A, MD, 100 mg at 02/02/20 2136 .  QUEtiapine (SEROQUEL) tablet 25 mg, 25 mg, Oral, QHS, Smith, Rondell A, MD, 25 mg at 02/02/20 2136 .  sodium chloride flush (NS) 0.9 % injection 10-40 mL, 10-40 mL, Intracatheter, Q12H, Smith, Rondell A, MD, 10 mL at 02/02/20 2138 .  sodium chloride flush (NS) 0.9 % injection 10-40 mL, 10-40 mL, Intracatheter, PRN, Tamala Julian, Rondell A, MD, 10 mL at 02/02/20 1712 .  sodium chloride flush (NS) 0.9 % injection 3 mL, 3 mL, Intravenous, Q12H, Smith, Rondell A, MD, 3 mL at 02/02/20 1005  Exam: Current vital signs: BP 102/66 (BP Location: Left Arm)   Pulse 100   Temp 100 F (37.8 C) (Oral)   Resp 17   Ht 5\' 5"  (1.651 m)   Wt 68 kg   SpO2 96%   BMI 24.96 kg/m  Vital signs in last 24 hours: Temp:  [98.6 F (37 C)-100 F (37.8 C)] 100 F (37.8 C) (06/02 1456) Pulse Rate:  [89-100] 100 (06/02 1456) Resp:  [16-17] 17 (06/02 1456) BP: (102-104)/(66-71) 102/66 (06/02 1456) SpO2:  [95 %-96 %] 96 % (06/02 1456) General: Awake alert in no distress HEENT: Normocephalic atraumatic Lungs: Clear Cardiovascular: Regular rhythm Abdomen soft nondistended nontender Extremities warm well perfused with intact pulses, chronic venous stasis changes seen. Neurological exam He is awake, alert, oriented to place, date and month but not to the year. She was able to tell me the current president, the president prior to the current president but could not tell me any president before that. She has mildly reduced attention concentration She has no evidence of dysarthria or  aphasia. Cranial nerve examination: Pupils equal round react light, extraocular movements intact, visual fields are full, facial sensation intact and face appears symmetric, auditory acuity intact to conversational voice, palate midline, shoulder shrug intact, tongue midline. Motor exam: Upper extremity 5/5 antigravity without drift bilaterally.  Lower extremity examination reveals 4+/5 strength in both hip flexors with mild effort dependent weakness.  Knee flexion extension is at least a 4+/5 as well.  Plantar and dorsiflexion 5/5. Sensory exam: Intact to light touch without extinction but reports some hyperesthesia in both lower extremities. DTRs: Are 2+ in the upper extremities, 2+ at the knees, unable to elicit at the ankles but there is no clonus and no Hoffman's sign on the upper extremities. Coordination: Intact finger-nose-finger testing. Gait testing deferred at this time  Labs I have reviewed labs in epic and the results pertinent to this consultation are:  CBC    Component Value Date/Time   WBC 7.8 02/03/2020 0449   RBC 3.72 (  L) 02/03/2020 0449   HGB 11.1 (L) 02/03/2020 0449   HCT 36.3 02/03/2020 0449   PLT 270 02/03/2020 0449   MCV 97.6 02/03/2020 0449   MCH 29.8 02/03/2020 0449   MCHC 30.6 02/03/2020 0449   RDW 13.3 02/03/2020 0449   LYMPHSABS 2.2 01/23/2020 0451   MONOABS 0.5 01/23/2020 0451   EOSABS 0.5 01/23/2020 0451   BASOSABS 0.0 01/23/2020 0451    CMP     Component Value Date/Time   NA 140 02/03/2020 0449   NA 137 03/02/2019 1612   K 3.8 02/03/2020 0449   CL 103 02/03/2020 0449   CO2 27 02/03/2020 0449   GLUCOSE 93 02/03/2020 0449   BUN <5 (L) 02/03/2020 0449   BUN 8 03/02/2019 1612   CREATININE 0.72 02/03/2020 0449   CREATININE 0.80 01/08/2017 1449   CALCIUM 8.7 (L) 02/03/2020 0449   PROT 6.6 01/20/2020 1049   PROT 6.8 03/02/2019 1612   ALBUMIN 3.2 (L) 01/20/2020 1049   ALBUMIN 4.2 03/02/2019 1612   AST 37 01/20/2020 1049   ALT 19 01/20/2020 1049    ALKPHOS 138 (H) 01/20/2020 1049   BILITOT 0.6 01/20/2020 1049   BILITOT 0.2 03/02/2019 1612   GFRNONAA >60 02/03/2020 0449   GFRAA >60 02/03/2020 0449   Imaging I have reviewed the images obtained: MRI examination of the brain-02/02/2020-no acute infarct or hemorrhage.  Confluent hyperintense T2 signal periventricular deep white matter most common seen due to chronic ischemic microangiopathy but she might have sequelae of her prior HSV encephalitis as well. MR C-spine-unchanged from prior with severe spinal canal stenosis at C5-6 with indentation of the ventral aspect of the spinal cord.  Hyperintense T2-weighted signal within the dorsal spinal cord at C3-4 that was unchanged from prior study and likely sequela of her transverse myelitis.  Unchanged mild spinal canal stenosis at C4-5 and 6 7.  Unchanged severe bilateral C5-6 and moderate bilateral C4-5 and 6 7 foraminal stenosis. Lumbar spine MRI imaging: Mild spinal canal stenosis and moderate bilateral neural foraminal stenosis at L4-L5 secondary to grade 1 anterolisthesis caused by severe facet arthrosis.  Left lateral recess narrowing at L3-4.  Partial involution of left subarticular disc protrusion at L1-2.  No associated stenosis.  Assessment:  Progressive gait difficulty and lower extremity weakness. I am not completely convinced that progressive lower extremity weakness is only is due to an acute spinal pathology based on history and exam but rather might be multifactorial and ongoing as discussed below. I would reimage her hip to make sure that there is no progression of the pelvic fracture causing her to be unstable.  She also continues to complain of severe burning neuropathic type of pain which might require some medication adjustment with her pain medicine physician. 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 Due to recent admissions, progressive difficulty with gait, I agree with orthopedics, might be due to deconditioning as well.  CIR was recommended but declined by insurance in April and she was placed in SNF. There is no evidence of discitis  or an acute infection. There is no evidence of abnormal enhancement in the cord warranting IV steroids.   Recommendations: -X-rays of the pelvis -If no fracture or unstable joints, aggressive gait training and physical therapy. -Optimization of pain medications per primary team as you are -Outpatient neurology and pain management follow-up.  Please call with questions. -- Amie Portland, MD [facility] Pager: [phone] If 7pm to 7am, please call on call as listed on AMION.

## 2020-02-03 NOTE — Progress Notes (Signed)
PROGRESS NOTE  Katelyn Lamb MGN:[id number] DOB: [birth year] DOA: 02/01/2020 PCP: Katelyn Amel, MD  Brief History   The patient is a 74 yr old woman who was discharged from this facility on 01/25/2020 after an inpatient stay for MDRO Klebsiella UTI. She went home with a PICC for long term antibiotics which concluded on 02/01/2020. The patient's family returned her to the ED on 02/02/2020, because they were unable to care for her at home due to her inability to walk with bilateral lower extremity weakness, sore ankles and knees. She is usually able to walk with a walker, but she has not been able to do this in the last couple of days.  The admitting physician discussed the patient with neurology who stated that unless she was febrile there was no concern for HSV encephalitis and there was no need for an LP, but they did not do a formal consult. He also spoke with Dr. Shelba Lamb of neurosurgery who informed Dr. Tamala Lamb that the patient is followed by Dr. Rolena Lamb of [facility] for her spinal stenosis, and he should be consulted. I discussed the patient with Dr. Rolena Lamb today and formally consulted him. He will see the patient, but stated that the patient required a formal consult from neurology. I have now discussed the patient with Dr. Cheral Lamb of neurology who will perform a formal consult.  The patient's family has decided that the patient will need discharge to SNF. TOC has been consulted.  Consultants  . Neurology . Orthopedic surgery . Palliative Care  Procedures  . None  Antibiotics  . None  Subjective  The patient is resting comfortably. No new complaints.  Objective   Vitals:  Vitals:   02/03/20 0415 02/03/20 1456  BP: 104/71 102/66  Pulse: 89 100  Resp: 16 17  Temp: 98.6 F (37 C) 100 F (37.8 C)  SpO2: 95% 96%   Exam:  Constitutional:  . The patient is awake, alert, and oriented x 3. No acute distress. Respiratory:  . No increased work of breathing. . No wheezes, rales,  or rhonchi . No tactile fremitus Cardiovascular:  . Regular rate and rhythm . No murmurs, ectopy, or gallups. . No lateral PMI. No thrills. Abdomen:  . Abdomen is soft, non-tender, non-distended . No hernias, masses, or organomegaly . Normoactive bowel sounds.  Musculoskeletal:  . No cyanosis, clubbing, or edema Skin:  . No rashes, lesions, ulcers . palpation of skin: no induration or nodules Neurologic:  . CN 2-12 intact . Sensation all 4 extremities intact Psychiatric:  . Mental status o Mood, affect appropriate o Orientation to person, place, time  . judgment and insight appear intact   I have personally reviewed the following:   Today's Data  . Vitals, BMP, CBC  Imaging  . MRI  Scheduled Meds: . acyclovir  400 mg Oral BID  . Chlorhexidine Gluconate Cloth  6 each Topical Daily  . diazepam  5 mg Oral BID  . DULoxetine  30 mg Oral q morning - 10a  . DULoxetine  60 mg Oral QHS  . enoxaparin (LOVENOX) injection  40 mg Subcutaneous Q24H  . pantoprazole  40 mg Oral QHS  . pregabalin  100 mg Oral QHS  . QUEtiapine  25 mg Oral QHS  . sodium chloride flush  10-40 mL Intracatheter Q12H  . sodium chloride flush  3 mL Intravenous Q12H   Continuous Infusions:  Principal Problem:   Lower extremity weakness Active Problems:   Gastroesophageal reflux disease   Anxiety  about health   Macrocytic anemia   Spinal stenosis   LOS: 1 day   A & P  Lower extremity weakness, spinal stenosis: Acute on chronic.  Patient reports having severe pain in her bilateral ankles and knees.  Imaging studies significant for several areas of spinal stenosis. The patient has been admitted to a med/surg bed. ESR is only minimally elevated. CRP is normal. Neurology and orthopedic surgery have been consulted. MRI has been performed and demonstrated no new changes. PT/OT has been consulted as well as TOC as the patient will need SNF placement.  Chronic pain: Patient has a history of chronic lower  pain with prior kyphoplasty.  Home medications include hydrocodone as needed for pain, Lyrica, and Voltaren gel. Continue current regimen. Palliative care consult for symptom management.  History of HSV encephalitis in 2017: MRI images were evaluated by neurology, but felt symptoms not likely related to liver HSV. Continue acyclovir.  Macrocytic anemia: Chronic.  On admission hemoglobin 11.4 with MCV 100.5. Monitor.  Anxiety and depression: Continue Cymbalta and Valium   Recent history of ESBL UTI due to neurogenic bladder. The patient has completed course ertapenem through PICC line on 5/31.  Repeat urinalysis negative for any acute abnormalities. Remove PICC line prior to dc.  Discussed with infectious disease.  GERD: Continue Protonix  I have seen and examined this patient myself. I have spent 34 minutes in her evaluation and care.  DVT prophylaxis: Lovenox Code Status: Full(discussed previous DNR status with patient and she requested to be a full code) Family Communication: None available. Disposition Plan: The patient is from home. Anticipated discharge is to SNF. Barriers to discharge include needed completion of evaluation of bilateral lower extremity weakness and inability to walk. Admission status: Inpatient  Katelyn Valentine, DO [facility] Direct contact: see [email or web address]  7PM-7AM contact night coverage as above 02/03/2020, 6:04 PM  LOS: 1 day

## 2020-02-03 NOTE — Progress Notes (Signed)
Patient family brought her in yesterday basically because she was unable to ambulate.  Normally can ambulate with use of a walker.  Daughter noted that they had concern with her father's ability to care for her at home if unable to ambulate.  She was noted to be afebrile and after neurology evaluated MRI images they felt there was no concern for HSV encephalitis or need of LP.  Case had been discussed with Dr. Shelba Flake of neurosurgery who informed me that patient is actually followed by Dr. Rolena Infante of orthopedics and should be the person evaluating the patient if needed in the hospital.

## 2020-02-04 ENCOUNTER — Telehealth: Payer: Self-pay | Admitting: Neurology

## 2020-02-04 ENCOUNTER — Inpatient Hospital Stay (HOSPITAL_COMMUNITY): Payer: PPO

## 2020-02-04 MED ORDER — DIAZEPAM 2 MG PO TABS: 5.0000 mg | ORAL_TABLET | Freq: Two times a day (BID) | ORAL | Status: DC | PRN

## 2020-02-04 MED ORDER — GADOBUTROL 1 MMOL/ML IV SOLN
6.0000 mL | Freq: Once | INTRAVENOUS | Status: AC | PRN
  Administered 2020-02-04: 6 mL via INTRAVENOUS

## 2020-02-04 MED ORDER — HYDROCODONE-ACETAMINOPHEN 5-325 MG PO TABS
1.0000 | ORAL_TABLET | Freq: Four times a day (QID) | ORAL | Status: DC | PRN
  Administered 2020-02-05 – 2020-02-06 (×2): 1 via ORAL
  Filled 2020-02-04 (×2): qty 1

## 2020-02-04 MED ORDER — HYDROCODONE-ACETAMINOPHEN 10-325 MG PO TABS: 1.0000 | ORAL_TABLET | Freq: Four times a day (QID) | ORAL | Status: DC | PRN

## 2020-02-04 NOTE — Progress Notes (Signed)
Physical Therapy Treatment Patient Details Name: Katelyn Lamb MRN: [id number] DOB: [birth year] Today's Date: 02/04/2020    History of Present Illness Pt is a 74 y/o female admitted secondary to increased LE weakness and inability to ambulate. Found to be hypotensive upon admission.  MRI brainand entire spine without acute changes, does have chronic severe spinal stenosis in C spine and mild in lumbar.  Had pelvis xray today that revealed Nondisplaced acute to subacute fracture involving the left side ofthe pubic symphysis which extends into the left inferior pubicramus. No weight bearing restrictions at this time.  PMH includes recent UTI, anxiety, and transverse myelitis.    PT Comments    Pt making good progress.  Demonstrated improved gait distance and quality.  Required min A for transfers with min cues for techniques.  Does cont to fatigue easily and required rest breaks.  Cont POC.    Follow Up Recommendations  Supervision/Assistance - 24 hour;SNF     Equipment Recommendations  None recommended by PT    Recommendations for Other Services       Precautions / Restrictions Precautions Precautions: Fall Precaution Comments: watch sats    Mobility  Bed Mobility Overal bed mobility: Needs Assistance Bed Mobility: Sit to Supine       Sit to supine: Supervision      Transfers Overall transfer level: Needs assistance Equipment used: Rolling walker (2 wheeled) Transfers: Sit to/from [facility] Sit to Stand: Min assist Stand pivot transfers: Min assist       General transfer comment: Sit to stand x 5 with cues for safe hand placement and reaching back to sit; used RW for stand pivot to bsc and back to recliner  Ambulation/Gait Ambulation/Gait assistance: Min guard Gait Distance (Feet): 30 Feet(x2) Assistive device: Rolling walker (2 wheeled) Gait Pattern/deviations: Step-through pattern;Decreased stride length;Shuffle Gait velocity: decreased   General  Gait Details: Pt ambulated to the door and back twice in room with seated rest break.  She was able to complete turns with min cues for walker.  Did note legs minimally shakey, but pt still able to maintain balance.   Stairs             Wheelchair Mobility    Modified Rankin (Stroke Patients Only)       Balance Overall balance assessment: Needs assistance Sitting-balance support: No upper extremity supported;Feet supported Sitting balance-Leahy Scale: Good     Standing balance support: Bilateral upper extremity supported;During functional activity Standing balance-Leahy Scale: Fair Standing balance comment: Pt used RW but able to balance with min guard                            Cognition Arousal/Alertness: Awake/alert Behavior During Therapy: WFL for tasks assessed/performed Overall Cognitive Status: History of cognitive impairments - at baseline                         Following Commands: Follows multi-step commands consistently              Exercises      General Comments General comments (skin integrity, edema, etc.): VSS; spouse present and reports significant improvements      Pertinent Vitals/Pain Pain Assessment: Faces Faces Pain Scale: Hurts a little bit Pain Location: lower back Pain Descriptors / Indicators: Aching;Dull Pain Intervention(s): Limited activity within patient's tolerance;Monitored during session;Repositioned(improved when back in bed)    Home Living  Prior Function            PT Goals (current goals can now be found in the care plan section) Progress towards PT goals: Progressing toward goals    Frequency    Min 5X/week      PT Plan Current plan remains appropriate    Co-evaluation              AM-PAC PT "6 Clicks" Mobility   Outcome Measure  Help needed turning from your back to your side while in a flat bed without using bedrails?: A Little Help needed  moving from lying on your back to sitting on the side of a flat bed without using bedrails?: A Little Help needed moving to and from a bed to a chair (including a wheelchair)?: A Little Help needed standing up from a chair using your arms (e.g., wheelchair or bedside chair)?: A Little Help needed to walk in hospital room?: A Little Help needed climbing 3-5 steps with a railing? : A Lot 6 Click Score: 17    End of Session Equipment Utilized During Treatment: Gait belt Activity Tolerance: Patient tolerated treatment well Patient left: in bed;with family/visitor present;with call bell/phone within reach Nurse Communication: Mobility status(bed alarm not working) PT Visit Diagnosis: Unsteadiness on feet (R26.81);Muscle weakness (generalized) (M62.81)     Time: [id number] PT Time Calculation (min) (ACUTE ONLY): 30 min  Charges:  $Gait Training: 8-22 mins $Therapeutic Activity: 8-22 mins                     Katelyn Lamb, PT Acute Rehab Services Pager [phone] [facility] [phone] [facility] [phone]    Katelyn Lamb 02/04/2020, 5:54 PM

## 2020-02-04 NOTE — Progress Notes (Signed)
MRI T-spine has been ordered.   Electronically signed: Dr. Kerney Elbe

## 2020-02-04 NOTE — Progress Notes (Signed)
PROGRESS NOTE  Katelyn Lamb FKC:[id number] DOB: [birth year] DOA: 02/01/2020 PCP: [name], MD  Brief History   The patient is a 74 yr old woman who was discharged from this facility on 01/25/2020 after an inpatient stay for MDRO Klebsiella UTI. She went home with a PICC for long term antibiotics which concluded on 02/01/2020. The patient's family returned her to the ED on 02/02/2020, because they were unable to care for her at home due to her inability to walk with bilateral lower extremity weakness, sore ankles and knees. She is usually able to walk with a walker, but she has not been able to do this in the last couple of days.  The admitting physician discussed the patient with neurology who stated that unless she was febrile there was no concern for HSV encephalitis and there was no need for an LP, but they did not do a formal consult. He also spoke with Dr. Shelba Flake of neurosurgery who informed Dr. Tamala Julian that the patient is followed by Dr. Rolena Infante of [facility] for her spinal stenosis, and he should be consulted. I discussed the patient with Dr. Rolena Infante today and formally consulted him. He will see the patient, but stated that the patient required a formal consult from neurology. I have now discussed the patient with Dr. Cheral Marker of neurology who will perform a formal consult.  The patient's family has decided that the patient will need discharge to SNF. TOC has been consulted. B/L hip x-ray is pending. MRI of the T-spine is pending.  Consultants  . Neurology . Orthopedic surgery . Palliative Care  Procedures  . None  Antibiotics  . None  Subjective  The patient is resting comfortably. Her husband complains that the patient has been lethargic all day. The patient is currently sitting up in a chair.  Objective   Vitals:  Vitals:   02/04/20 0511 02/04/20 1333  BP: 95/69 (!) 89/70  Pulse: 82 75  Resp: 17 14  Temp: 98.6 F (37 C) (!) 97.5 F (36.4 C)  SpO2: 92% 93%    Exam:  Constitutional:  . The patient is awake, alert, and oriented x 3. No acute distress. Respiratory:  . No increased work of breathing. . No wheezes, rales, or rhonchi . No tactile fremitus Cardiovascular:  . Regular rate and rhythm . No murmurs, ectopy, or gallups. . No lateral PMI. No thrills. Abdomen:  . Abdomen is soft, non-tender, non-distended . No hernias, masses, or organomegaly . Normoactive bowel sounds.  Musculoskeletal:  . No cyanosis, clubbing, or edema Skin:  . No rashes, lesions, ulcers . palpation of skin: no induration or nodules Neurologic:  . CN 2-12 intact . Sensation all 4 extremities intact Psychiatric:  . Mental status o Mood, affect appropriate o Orientation to person, place, time  . judgment and insight appear intact   I have personally reviewed the following:   Today's Data  . Vitals, BMP, CBC  Imaging  . MRI  Scheduled Meds: . acyclovir  400 mg Oral BID  . Chlorhexidine Gluconate Cloth  6 each Topical Daily  . diazepam  5 mg Oral BID  . DULoxetine  30 mg Oral q morning - 10a  . DULoxetine  60 mg Oral QHS  . enoxaparin (LOVENOX) injection  40 mg Subcutaneous Q24H  . pantoprazole  40 mg Oral QHS  . pregabalin  100 mg Oral QHS  . QUEtiapine  25 mg Oral QHS  . sodium chloride flush  10-40 mL Intracatheter Q12H  .  sodium chloride flush  3 mL Intravenous Q12H   Continuous Infusions:  Principal Problem:   Lower extremity weakness Active Problems:   Gastroesophageal reflux disease   Anxiety about health   Macrocytic anemia   Spinal stenosis   LOS: 2 days   A & P  Lower extremity weakness, spinal stenosis: Acute on chronic.  Patient reports having severe pain in her bilateral ankles and knees.  Imaging studies significant for several areas of spinal stenosis. The patient has been admitted to a med/surg bed. ESR is only minimally elevated. CRP is normal. Neurology and orthopedic surgery have been consulted. MRI has been  performed and demonstrated no new changes. PT/OT has been consulted as well as TOC as the patient will need SNF placement.  Decreased level of consciousness: I have made valium which was listed as scheduled bid at home prn here. I have also decreased her hydrocodone dosing. Monitor.  Chronic pain: Patient has a history of chronic lower pain with prior kyphoplasty.  Home medications include hydrocodone as needed for pain, Lyrica, and Voltaren gel. Continue current regimen. Palliative care consult for symptom management.  History of HSV encephalitis in 2017: MRI images were evaluated by neurology, but felt symptoms not likely related to liver HSV. Continue acyclovir.  Macrocytic anemia: Chronic.  On admission hemoglobin 11.4 with MCV 100.5. Monitor.  Anxiety and depression: Continue Cymbalta and Valium   Recent history of ESBL UTI due to neurogenic bladder. The patient has completed course ertapenem through PICC line on 5/31.  Repeat urinalysis negative for any acute abnormalities. Remove PICC line prior to dc.  Discussed with infectious disease.  GERD: Continue Protonix  I have seen and examined this patient myself. I have spent 34 minutes in her evaluation and care.  DVT prophylaxis: Lovenox Code Status: Full(discussed previous DNR status with patient and she requested to be a full code) Family Communication: None available. Disposition Plan: The patient is from home. Anticipated discharge is to SNF. Barriers to discharge include needed completion of evaluation of bilateral lower extremity weakness and inability to walk. Admission status: Inpatient  Katelyn Atha, DO [facility] Direct contact: see [email or web address]  7PM-7AM contact night coverage as above 02/04/2020, 4:09 PM  LOS: 1 day

## 2020-02-04 NOTE — Telephone Encounter (Signed)
I called pts husband Katelyn Lamb about pt being admitted to hospital for weakness in extremities. He stated patient  had some images on 02/02/2020 and today in the hospital . The orthopedic MD wanted Dr.WIllis to give some input on the the patients care. He stated pt will be seeing the neurological team tomorrow and saw infectious disease today. I stated we are closed tomorrow and Dr.WIllis will be on Monday to discuss with him about wife care. I stated message will be sent to him. The husband stated Dr. Jannifer Franklin can call him on the cell phone and he verbalized understanding.

## 2020-02-04 NOTE — TOC Progression Note (Signed)
Transition of Care [facility]) - Progression Note    Patient Details  Name: Katelyn Lamb MRN: [id number] Date of Birth: 1946-01-13  Transition of Care [facility]) CM/SW [place], [place] Phone Number: 02/04/2020, 4:08 PM  Clinical Narrative:     CSW went to pt room. Husband and CSW spoke outside of room while pt rested. CSW presented SNF bed offers. Husband had concerns about low star ratings. Potentially interested in [facility] though requested CSW expand bed search. CSW expanded bed search to include [facility] and [facility]. TOC will continue to follow.   Expected Discharge Plan: [place] Two Barriers to Discharge: Continued Medical Work up  Expected Discharge Plan and Services Expected Discharge Plan: [place]   Discharge Planning Services: CM Consult Post Acute Care Choice: [place] arrangements for the past 2 months: Single Family Home                 DME Arranged: N/A DME Agency: NA       HH Arranged: PT, OT, RN [place] Agency: [facility] Date [place]: 02/02/20 Time [place]: [id number] Representative spoke with at [place]: Cassie   Social Determinants of Health ([place]) Interventions    Readmission Risk Interventions No flowsheet data found.

## 2020-02-04 NOTE — TOC Progression Note (Signed)
Transition of Care [facility]) - Progression Note    Patient Details  Name: Katelyn Lamb MRN: [id number] Date of Birth: [birth year]  Transition of Care [facility]) CM/SW [place], [place] Phone Number: 02/04/2020, 12:19 PM  Clinical Narrative:    CSW contacted [facility], informed they do not have any bed availability. CSW went to patient's room, patient gone for MRI. CSW attempted to contact patient's husband to provide additional bed offers, left a voicemail requesting a call back. CSW will continue to follow.   Expected Discharge Plan: [place] Barriers to Discharge: Continued Medical Work up  Expected Discharge Plan and Services Expected Discharge Plan: [place]   Discharge Planning Services: CM Consult Post Acute Care Choice: [place] arrangements for the past 2 months: Single Family Home                 DME Arranged: N/A DME Agency: NA       HH Arranged: PT, OT, RN [place] Agency: [facility] Date [place]: 02/02/20 Time [place]: [id number] Representative spoke with at [place]: Cassie   Social Determinants of Health ([place]) Interventions    Readmission Risk Interventions No flowsheet data found.

## 2020-02-04 NOTE — Progress Notes (Signed)
PT Cancellation Note  Patient Details Name: JALONDA WILMOTT MRN: [id number] DOB: 1946/05/03   Cancelled Treatment:    Reason Eval/Treat Not Completed: (P) Patient at procedure or test/unavailable(Pt with transport present heading to MRI.)   Lorris Carducci Eli Hose 02/04/2020, 11:00 AM Erasmo Leventhal , PTA Acute Rehabilitation Services Pager [phone] Office [phone]

## 2020-02-04 NOTE — Telephone Encounter (Signed)
Pt's husband called wanting to inform her provider that she has been admitted to the hospital due to having issues with walking and he is wanting to make sure that the provider takes a look at the pt's MRI results and advise them. Husband is aware that provider will be returning to office on Mon.

## 2020-02-05 NOTE — Social Work (Addendum)
CSW spoke with Lexine Baton of [facility] and confirmed they are able to accept patient Saturday if medically stable for discharge. CSW updated MD and requested a new covid. CSW updated patient's spouse and provided him contact information for the SNF. Spouse will need to sign patient into SNF. No further questions expressed at this time.  Insurance authorization is good for 5 business days, beginning 6/4. Authorization [id number]. When ready for transport, if during the weekend contact on-call [phone] to ambulance authorization. TOC team will continue to follow & assist with discharge needs.  Criss Alvine, MSW, [employer]

## 2020-02-05 NOTE — Care Management (Addendum)
Cassie with [facility] updated and continues to follow. Awaiting determination of peer to peer review for SNF.   Magdalen Spatz RN

## 2020-02-05 NOTE — Progress Notes (Signed)
PROGRESS NOTE  Katelyn Lamb:[id number] DOB: 1946/03/19 DOA: 02/01/2020 PCP: [name], MD  Brief History   The patient is a 74 yr old woman who was discharged from this facility on 01/25/2020 after an inpatient stay for MDRO Klebsiella UTI. She went home with a PICC for long term antibiotics which concluded on 02/01/2020. The patient's family returned her to the ED on 02/02/2020, because they were unable to care for her at home due to her inability to walk with bilateral lower extremity weakness, sore ankles and knees. She is usually able to walk with a walker, but she has not been able to do this in the last couple of days.  The admitting physician discussed the patient with neurology who stated that unless she was febrile there was no concern for HSV encephalitis and there was no need for an LP, but they did not do a formal consult. He also spoke with Dr. Shelba Flake of neurosurgery who informed Dr. Tamala Julian that the patient is followed by Dr. Rolena Infante of [facility] for her spinal stenosis, and he should be consulted. I discussed the patient with Dr. Rolena Infante today and formally consulted him. He will see the patient, but stated that the patient required a formal consult from neurology. I have now discussed the patient with Dr. Cheral Marker of neurology who will perform a formal consult.  The patient's family has decided that the patient will need discharge to SNF. TOC has been consulted. B/L hip x-ray is demosntrates a worsening of fracture of inferior pubic symphysis noted in 12/06/2019 x-ray. MRI of the T-spine does not demonstrate a new spinal cord lesion.  Consultants  . Neurology . Orthopedic surgery . Palliative Care  Procedures  . None  Antibiotics  . None  Subjective  The patient is resting comfortably. No new complaints.  Objective   Vitals:  Vitals:   02/04/20 2020 02/05/20 1458  BP: 99/69 94/71  Pulse: 81 82  Resp: 17 16  Temp: 98 F (36.7 C)   SpO2: 93% 95%    Exam:  Constitutional:  . The patient is awake, alert, and oriented x 3. No acute distress. Respiratory:  . No increased work of breathing. . No wheezes, rales, or rhonchi . No tactile fremitus Cardiovascular:  . Regular rate and rhythm . No murmurs, ectopy, or gallups. . No lateral PMI. No thrills. Abdomen:  . Abdomen is soft, non-tender, non-distended . No hernias, masses, or organomegaly . Normoactive bowel sounds.  Musculoskeletal:  . No cyanosis, clubbing, or edema Skin:  . No rashes, lesions, ulcers . palpation of skin: no induration or nodules Neurologic:  . CN 2-12 intact . Sensation all 4 extremities intact Psychiatric:  . Mental status o Mood, affect appropriate o Orientation to person, place, time  . judgment and insight appear intact  I have personally reviewed the following:   Today's Data  . Vitals  Imaging  . MRI . X-ray of pelvis  Scheduled Meds: . acyclovir  400 mg Oral BID  . Chlorhexidine Gluconate Cloth  6 each Topical Daily  . DULoxetine  30 mg Oral q morning - 10a  . DULoxetine  60 mg Oral QHS  . enoxaparin (LOVENOX) injection  40 mg Subcutaneous Q24H  . pantoprazole  40 mg Oral QHS  . pregabalin  100 mg Oral QHS  . QUEtiapine  25 mg Oral QHS  . sodium chloride flush  10-40 mL Intracatheter Q12H  . sodium chloride flush  3 mL Intravenous Q12H   Continuous  Infusions:  Principal Problem:   Lower extremity weakness Active Problems:   Gastroesophageal reflux disease   Anxiety about health   Macrocytic anemia   Spinal stenosis   LOS: 3 days   A & P  Lower extremity weakness, spinal stenosis: Acute on chronic.  Patient reports having severe pain in her bilateral ankles and knees.  Imaging studies significant for several areas of spinal stenosis. The patient has been admitted to a med/surg bed. ESR is only minimally elevated. CRP is normal. Neurology and orthopedic surgery have been consulted. MRI has been performed and demonstrated  no new changes. PT/OT has been consulted as well as TOC as the patient will need SNF placement.  Decreased level of consciousness: Much improved after I made valium, which was listed as scheduled bid at home, prn here. I have also decreased her hydrocodone dosing. Monitor.  Chronic pain: Patient has a history of chronic lower pain with prior kyphoplasty.  Home medications include hydrocodone as needed for pain, Lyrica, and Voltaren gel. Continue current regimen. Palliative care consult for symptom management.  History of HSV encephalitis in 2017: MRI images were evaluated by neurology, but felt symptoms not likely related to liver HSV. Continue acyclovir. No new lesions in spinal cord per MRI.  Macrocytic anemia: Chronic.  On admission hemoglobin 11.4 with MCV 100.5. Monitor.  Anxiety and depression: Continue Cymbalta and Valium   Recent history of ESBL UTI due to neurogenic bladder. The patient has completed course ertapenem through PICC line on 5/31.  Repeat urinalysis negative for any acute abnormalities. Remove PICC line prior to dc.  Discussed with infectious disease.  GERD: Continue Protonix  I have seen and examined this patient myself. I have spent 32 minutes in her evaluation and care.  DVT prophylaxis: Lovenox Code Status: Full(discussed previous DNR status with patient and she requested to be a full code) Family Communication: None available. Disposition Plan: The patient is from home. Anticipated discharge is to SNF. Barriers to discharge include needed completion of evaluation of bilateral lower extremity weakness and inability to walk. Admission status: Inpatient  Katelyn Pfefferkorn, DO [facility] Direct contact: see [email or web address]  7PM-7AM contact night coverage as above 02/05/2020, 4:44 PM  LOS: 1 day

## 2020-02-05 NOTE — Plan of Care (Signed)

## 2020-02-05 NOTE — TOC Progression Note (Addendum)
Transition of Care [facility]) - Progression Note    Patient Details  Name: Katelyn Lamb MRN: [id number] Date of Birth: November 20, 1945  Transition of Care [facility]) CM/SW [place], [place] Phone Number: 02/05/2020, 11:13 AM  Clinical Narrative:    Update: Peer to peer review has been approved. Insurance authorization is good for 5 business days, beginning 6/4. Authorization [id number]. CSW met with patient and husband to provide an update on the approval. CSW updated patient and spouse that there have been no new bed offers.CSW spoke with [facility] and they are reviewing referral.  CSW asked spouse what a secondary choice will be, spouse expressed he will determine it once he gets a decision from [facility].   CSW contacted by [place] and informed insurance authorization declined, stating it does not appear to meet medical necessity. [facility] requested a peer to peer review be completed with Dr. Amalia Hailey [phone]. CSW notified MD of the information and need for peer to peer. CSW will continue to follow.   Expected Discharge Plan: [place] Barriers to Discharge: Continued Medical Work up  Expected Discharge Plan and Services Expected Discharge Plan: [place]   Discharge Planning Services: CM Consult Post Acute Care Choice: [place] arrangements for the past 2 months: Single Family Home                 DME Arranged: N/A DME Agency: NA       HH Arranged: PT, OT, RN [place] Agency: [facility] Date [place]: 02/02/20 Time [place]: [id number] Representative spoke with at [place]: Cassie   Social Determinants of Health ([place]) Interventions    Readmission Risk Interventions No flowsheet data found.

## 2020-02-05 NOTE — Telephone Encounter (Signed)
I called and talk with the husband.  The patient had a urinary tract infection coming to the hospital, treatment of this and with physical therapy as resulted in some improvement in her walking, she is doing fairly well at this time.  I do not believe that the cervical spine issue is the cause of her issues, but this is being monitored through Dr. Rolena Infante.  There is no evidence of actual spinal cord injury from the stenosis.

## 2020-02-06 DIAGNOSIS — S32599G Other specified fracture of unspecified pubis, subsequent encounter for fracture with delayed healing: Secondary | ICD-10-CM

## 2020-02-06 LAB — SARS CORONAVIRUS 2 BY RT PCR (HOSPITAL ORDER, PERFORMED IN ~~LOC~~ HOSPITAL LAB): SARS Coronavirus 2: NEGATIVE

## 2020-02-06 MED ORDER — DIAZEPAM 5 MG PO TABS: 5.0000 mg | ORAL_TABLET | Freq: Two times a day (BID) | ORAL | 0 refills | Status: DC | PRN

## 2020-02-06 NOTE — Discharge Summary (Addendum)
Physician Discharge Summary  Katelyn Lamb IHK:[id number] DOB: [birth year] DOA: 02/01/2020  PCP: [name], MD  Admit date: 02/01/2020 Discharge date: 02/06/2020  Recommendations for Outpatient Follow-up:  Discharge to home with home health PT/OT Follow up with PCP in 7-10 days.  Follow up with orthopedic surgery in 2 weeks.  Follow up with neurology in one month.  Seek referral to [facility] urology for urinary retention.  Follow-up Information     Schedule an appointment as soon as possible for a visit  with [name], MD.   Specialty: Neurology Contact information: [address] [place] [address] [place], [facility] Follow up.   Specialty: Home Health Services Contact information: [address] [place] [address] [phone]         [facility] Follow up.            Discharge Diagnoses: Principal diagnosis is #1 Bilateral lower extremity weakness Pain in ankles and knees Ambulatory dysfunction Debility - acute on chronic, in part due to UTI in addition to myelitis. Neuropathic pain Spinal stenosis Neurogenic bladder Pelvic fracture History of HSV encephalitis History of transverse myelitis Anxiety and depression  Discharge Condition: fair  Disposition: home with home health pt/ot/sw  Diet recommendation: Heart healthy  Filed Weights   02/01/20 1149  Weight: 68 kg   History of present illness: HPI: Katelyn Lamb is a 74 y.o. 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  Patient provides history as well as help from her daughter who is present at bedside.  She reports having severe pain in both of her ankles and knees which caused her to be unable to walk.  She lives at home with her husband and  reportedly almost fell, but her husband was able to her down to the floor.  They called EMS and they were able to help her get up.  Patient had been being treated for a ESBL urinary tract infection through a PICC line with ertapenem, but completed her last treatment yesterday.  Denied having any fever, dysuria, cough, shortness of breath, abdominal pain, nausea, vomiting, diarrhea, or change in appetite.   En route with EMS patient's systolic blood pressure were noted to be in the 80s with sitting.   ED Course: On admission into the emergency department patient was seen to be afebrile, respirations 13 and 24, blood pressure 90/63-121/84, and all other vital signs maintained.  Labs appeared near patient's baseline.  Urinalysis negative for any signs of infection.  MRI of the brain showed no acute abnormality and chronic small vessel disease.  MRI of the cervical spine reported to be unchanged with severe spinal cord stenosis at C5-6 with moderate bilateral stenosis C4-5, and C6-7 neural foraminal stenosis.  Hospital Course: The patient was admitted to a telemetry bed. Neurology and orthopedic surgery were consulted. Neurology felt that the patient's symptoms are likely sequelae to her transverse myelitis. MRI of the spine was performed and demonstrated no new lesions. Orthopedic surgery suggested repeat imaging of the patient's pelvis.. TOC has been consulted. B/L hip x-ray is demosntrates a worsening of fracture of inferior pubic symphysis noted in 12/06/2019 x-ray. MRI of the T-spine does not demonstrate a new spinal cord lesion.  Although initially the patient and her family agreed to  discharge to SNF as their inability to care for the patient at home was the reason for presentation, they have now decided that they would prefer to have the patient discharge to home with PT/OT. SNF discharge was recommended by PT/OT.  During the patient's stay her husband complained of lethargy. Her valium was made prn  instead of scheduled and her hydrocodone dosing was decreased. She was more appropriately alert following these changes.  Today's assessment: S: The patient is resting comfortably. She will be discharged to home.  O: Vitals:  Vitals:   02/06/20 0534 02/06/20 1435  BP: 101/62 94/71  Pulse: 85 79  Resp: 18 18  Temp: 98.2 F (36.8 C) 98 F (36.7 C)  SpO2: 95% 96%   Constitutional:  The patient is awake, alert, and oriented x 3. No acute distress. Respiratory:  No increased work of breathing. No wheezes, rales, or rhonchi No tactile fremitus Cardiovascular:  Regular rate and rhythm No murmurs, ectopy, or gallups. No lateral PMI. No thrills. Abdomen:  Abdomen is soft, non-tender, non-distended No hernias, masses, or organomegaly Normoactive bowel sounds.  Musculoskeletal:  No cyanosis, clubbing, or edema Skin:  No rashes, lesions, ulcers palpation of skin: no induration or nodules Neurologic:  CN 2-12 intact Sensation all 4 extremities intact Psychiatric:  Mental status Mood, affect appropriate Orientation to person, place, time  judgment and insight appear intact  Discharge Instructions  Discharge Instructions     Activity as tolerated - No restrictions   Complete by: As directed    Call MD for:  temperature >100.4   Complete by: As directed    Diet - low sodium heart healthy   Complete by: As directed    Discharge instructions   Complete by: As directed    Discharge to home with home health PT/OT, follow up with PCP in 7-10 days. Follow up with orthopedic surgery in 2 weeks. Follow up with neurology in one month. Seek referral to [facility] urology for urinary retention.   Increase activity slowly   Complete by: As directed    PICC line removal   Complete by: As directed       Allergies as of 02/06/2020       Reactions   Demerol [meperidine] Other (See Comments)   Hallucinations   Percocet [oxycodone-acetaminophen] Itching   Amoxicillin-pot Clavulanate  Diarrhea   Severe pain, headache, intestinal infection   Penicillins Itching, Rash   Has patient had a PCN reaction causing immediate rash, facial/tongue/throat swelling, SOB or lightheadedness with hypotension:  NO Has patient had a PCN reaction causing severe rash involving mucus membranes or skin necrosis: No Has patient had a PCN reaction that required hospitalization: No Has patient had a PCN reaction occurring within the last 10 years: Yes If all of the above answers are "NO", then may proceed with Cephalosporin use.        Medication List     STOP taking these medications    budesonide 0.25 MG/2ML nebulizer solution Commonly known as: PULMICORT   CALCIUM 600-D PO   conjugated estrogens vaginal cream Commonly known as: PREMARIN   ertapenem  IVPB Commonly known as: INVANZ   sodium chloride flush 0.9 % Soln Commonly known as: NS   VITAMIN B-12 PO       TAKE these medications    acyclovir 400 MG tablet Commonly known as: ZOVIRAX TAKE 1 TABLET BY MOUTH TWICE A DAY   diazepam 5 MG tablet Commonly known as: VALIUM Take 1 tablet (5 mg total)  by mouth every 12 (twelve) hours as needed for anxiety. What changed:  when to take this reasons to take this   diphenoxylate-atropine 2.5-0.025 MG tablet Commonly known as: LOMOTIL Take 1 tablet by mouth 4 (four) times daily as needed for diarrhea or loose stools.   DULoxetine 30 MG capsule Commonly known as: CYMBALTA TAKE 1 CAPSULE BY MOUTH EVERY DAY What changed:  how much to take when to take this   DULoxetine 60 MG capsule Commonly known as: CYMBALTA Take 1 capsule (60 mg total) by mouth at bedtime. What changed: Another medication with the same name was changed. Make sure you understand how and when to take each.   Hair/Skin/Nails/Biotin Tabs Take 1 tablet by mouth daily.   HYDROcodone-acetaminophen 10-325 MG tablet Commonly known as: NORCO Take 1 tablet by mouth every 6 (six) hours as needed for pain.     ipratropium-albuterol 0.5-2.5 (3) MG/3ML Soln Commonly known as: DUONEB Take 3 mLs by nebulization every 4 (four) hours as needed (For SOB and wheezing).   omega-3 acid ethyl esters 1 g capsule Commonly known as: LOVAZA Take 1 g by mouth daily.   pantoprazole 40 MG tablet Commonly known as: PROTONIX Take 1 tablet (40 mg total) by mouth daily. What changed: when to take this   polyethylene glycol 17 g packet Commonly known as: MIRALAX / GLYCOLAX Take 17 g by mouth daily. What changed:  when to take this reasons to take this   pregabalin 50 MG capsule Commonly known as: LYRICA Take 100 mg by mouth at bedtime.   QUEtiapine 25 MG tablet Commonly known as: SEROquel Take 1 tablet (25 mg total) by mouth at bedtime.   Voltaren 1 % Gel Generic drug: diclofenac Sodium Apply 1 application topically daily as needed (pain).       Allergies  Allergen Reactions   Demerol [Meperidine] Other (See Comments)    Hallucinations   Percocet [Oxycodone-Acetaminophen] Itching   Amoxicillin-Pot Clavulanate Diarrhea    Severe pain, headache, intestinal infection   Penicillins Itching and Rash    Has patient had a PCN reaction causing immediate rash, facial/tongue/throat swelling, SOB or lightheadedness with hypotension:  NO Has patient had a PCN reaction causing severe rash involving mucus membranes or skin necrosis: No Has patient had a PCN reaction that required hospitalization: No Has patient had a PCN reaction occurring within the last 10 years: Yes If all of the above answers are "NO", then may proceed with Cephalosporin use.    The results of significant diagnostics from this hospitalization (including imaging, microbiology, ancillary and laboratory) are listed below for reference.    Significant Diagnostic Studies: MR BRAIN WO CONTRAST  Result Date: 02/02/2020 CLINICAL DATA:  Encephalopathy EXAM: MRI HEAD WITHOUT CONTRAST TECHNIQUE: Multiplanar, multiecho pulse sequences of the  brain and surrounding structures were obtained without intravenous contrast. COMPARISON:  03/30/2019 FINDINGS: BRAIN: No acute infarct, acute hemorrhage or extra-axial collection. Early confluent hyperintense T2-weighted signal of the periventricular and deep white matter, most commonly due to chronic ischemic microangiopathy. There is generalized atrophy without lobar predilection. No chronic microhemorrhage. Normal midline structures. VASCULAR: Major flow voids are preserved. SKULL AND UPPER CERVICAL SPINE: Normal calvarium and skull base. Visualized upper cervical spine and soft tissues are normal. SINUSES/ORBITS: No paranasal sinus fluid levels or advanced mucosal thickening. No mastoid or middle ear effusion. Normal orbits. IMPRESSION: 1. No acute intracranial abnormality. 2. Moderate chronic small vessel disease. Electronically Signed   By: Ulyses Jarred M.D.   On: 02/02/2020 00:46  MR Cervical Spine W or Wo Contrast  Result Date: 02/02/2020 CLINICAL DATA:  History of transverse myelitis. Progressive lower extremity weakness. EXAM: MRI CERVICAL SPINE WITHOUT AND WITH CONTRAST TECHNIQUE: Multiplanar and multiecho pulse sequences of the cervical spine, to include the craniocervical junction and cervicothoracic junction, were obtained without and with intravenous contrast. CONTRAST:  3.9mL GADAVIST GADOBUTROL 1 MMOL/ML IV SOLN COMPARISON:  Cervical spine MRI 03/14/2018 FINDINGS: Alignment: Reversal of normal cervical lordosis may be positional or due to muscle spasm. Vertebrae: T2 M angioma.  No acute fracture. Cord: Hyperintense T2-weighted signal within the dorsal spinal cord at the C3-4 levels. This is unchanged from the prior study. Posterior Fossa, vertebral arteries, paraspinal tissues: Negative. Disc levels: C1-2: Unremarkable. C2-3: Normal disc space and facet joints. There is no spinal canal stenosis. No neural foraminal stenosis. C3-4: Small disc osteophyte complex, unchanged. There is no spinal  canal stenosis. No neural foraminal stenosis. C4-5: Unchanged intermediate disc osteophyte complex. Unchanged mild spinal canal stenosis. Unchanged moderate bilateral neural foraminal stenosis. C5-6: Unchanged central disc protrusion with endplate spurring indenting the ventral aspect of the spinal cord. Severe spinal canal stenosis. Unchanged severe bilateral neural foraminal stenosis. C6-7: Intermediate disc bulge. Unchanged mild spinal canal stenosis. Unchanged moderate neural foraminal stenosis. C7-T1: Normal disc space and facet joints. There is no spinal canal stenosis. No neural foraminal stenosis. IMPRESSION: 1. Unchanged examination with severe spinal canal stenosis at C5-6 with indentation of the ventral aspect of the spinal cord. 2. Hyperintense T2-weighted signal within the dorsal spinal cord at the C3-4 levels, unchanged from the prior study and likely a sequela of transverse myelitis. 3. Unchanged mild spinal canal stenosis at C4-5 and C6-7. 4. Unchanged severe bilateral C5-6 and moderate bilateral C4-5 and C6-7 neural foraminal stenosis. Electronically Signed   By: Ulyses Jarred M.D.   On: 02/02/2020 01:37   MR THORACIC SPINE W WO CONTRAST  Result Date: 02/04/2020 CLINICAL DATA:  Progressive difficulty walking. History of HSV encephalomyelitis and transverse myelitis. EXAM: MRI THORACIC WITHOUT AND WITH CONTRAST TECHNIQUE: Multiplanar and multiecho pulse sequences of the thoracic spine were obtained without and with intravenous contrast. CONTRAST:  66mL GADAVIST GADOBUTROL 1 MMOL/ML IV SOLN COMPARISON:  CTA chest dated July 23, 2019. MRI thoracic spine dated October 03, 2016. FINDINGS: Alignment:  Physiologic. Vertebrae: No fracture, evidence of discitis, or suspicious bone lesion. Unchanged atypical hemangioma in the T2 vertebral body. Partially visualized chronic L2 compression deformity status post cement augmentation. Cord: There is some residual T2 signal abnormality within the thoracic  cord at T2, T6, and T7-T8, decreased in intensity since the prior study from 2018, and without associated enhancement. No new cord lesion. Paraspinal and other soft tissues: Negative. Disc levels: Unchanged scattered small central and paracentral disc protrusions. No spinal canal or neuroforaminal stenosis at any level. IMPRESSION: 1. No acute abnormality. 2. Decreased intensity of multifocal T2 signal abnormality within the thoracic cord without associated enhancement, consistent with sequelae of prior myelitis. No new cord lesion. Electronically Signed   By: Titus Dubin M.D.   On: 02/04/2020 14:51   MR Lumbar Spine W Wo Contrast  Result Date: 02/02/2020 CLINICAL DATA:  Lower extremity weakness and low back pain EXAM: MRI LUMBAR SPINE WITHOUT AND WITH CONTRAST TECHNIQUE: Multiplanar and multiecho pulse sequences of the lumbar spine were obtained without and with intravenous contrast. CONTRAST:  3.11mL GADAVIST GADOBUTROL 1 MMOL/ML IV SOLN COMPARISON:  02/26/2008 FINDINGS: Segmentation:  Standard Alignment: Grade 1 anterolisthesis at L4-5 due to severe facet arthrosis Vertebrae:  Sclerotic areas within the L2 and L5 vertebrae compatible prior vertebral augmentation. No acute fracture. No discitis-osteomyelitis. Conus medullaris and cauda equina: Conus extends to the L1 level. Conus and cauda equina appear normal. Paraspinal and other soft tissues: Negative Disc levels: T12-L1: Small disc bulge.  No stenosis. L1-L2: Partial involution of left subarticular disc protrusion. There is no spinal canal stenosis. No neural foraminal stenosis. L2-L3: Normal disc space and facet joints. There is no spinal canal stenosis. No neural foraminal stenosis. L3-L4: Moderate facet hypertrophy with mild disc bulge. Left lateral recess narrowing without central spinal canal stenosis. No neural foraminal stenosis. L4-L5: Severe facet hypertrophy with intermediate sized disc uncovering. Mild spinal canal stenosis. Moderate bilateral  neural foraminal stenosis. L5-S1: Left asymmetric disc bulge. There is no spinal canal stenosis. No neural foraminal stenosis. Visualized sacrum: Normal. IMPRESSION: 1. Mild spinal canal stenosis and moderate bilateral neural foraminal stenosis at L4-L5 secondary to grade 1 anterolisthesis caused by severe facet arthrosis. 2. Left lateral recess narrowing at L3-L4. Correlate for L4 radiculopathy. 3. Partial involution of left subarticular disc protrusion at L1-L2. No associated stenosis. Electronically Signed   By: Ulyses Jarred M.D.   On: 02/02/2020 01:23   US RENAL  Result Date: 01/20/2020 CLINICAL DATA:  74 year old female with acute renal injury. EXAM: RENAL / URINARY TRACT ULTRASOUND COMPLETE COMPARISON:  CT Abdomen and Pelvis 07/23/2019. FINDINGS: Right Kidney: Renal measurements: 8.6 x 5.0 x 4.3 cm = volume: 96 mild mL . Echogenicity within normal limits. Thinning of the cortex (image 6). No mass or hydronephrosis visualized. Left Kidney: Renal measurements: 8.4 x 5.0 x 4.3 cm = volume: 95 mL. Echogenicity within normal limits. Thinning of the cortex (image 19). No mass or hydronephrosis visualized. Bladder: Appears normal for degree of bladder distention. Other: None. IMPRESSION: No acute renal finding.  Some renal cortical atrophy is evident. Electronically Signed   By: Genevie Ann M.D.   On: 01/20/2020 15:14   DG Chest Port 1 View  Result Date: 01/20/2020 CLINICAL DATA:  Shortness of breath. Weakness, fever and somnolence. On antibiotics for urinary tract infection. EXAM: PORTABLE CHEST 1 VIEW COMPARISON:  Radiographs 12/06/2019 and 07/13/2019. CT 07/23/2019. FINDINGS: 1126 hours. The heart size and mediastinal contours are stable. There are lower lung volumes with increased streaky bibasilar pulmonary opacities, radiographically most consistent with atelectasis. No consolidation, edema, pleural effusion or pneumothorax. The bones appear unchanged. Telemetry leads overlie the chest. IMPRESSION: Lower  lung volumes with increased streaky bibasilar pulmonary opacities, radiographically consistent with atelectasis. No consolidation or pleural effusion. Electronically Signed   By: Richardean Sale M.D.   On: 01/20/2020 11:38   DG HIPS BILAT WITH PELVIS 3-4 VIEWS  Result Date: 02/04/2020 CLINICAL DATA:  Pelvic fracture. EXAM: DG HIP (WITH OR WITHOUT PELVIS) 3-4V BILAT COMPARISON:  December 06, 2019. FINDINGS: There is no evidence of hip fracture or dislocation. However, there appears to be a nondisplaced acute to subacute fracture involving the left side of the pubic symphysis which extends into the left inferior pubic ramus. IMPRESSION: Nondisplaced acute to subacute fracture involving the left side of the pubic symphysis which extends into the left inferior pubic ramus. Hips are unremarkable. Electronically Signed   By: Marijo Conception M.D.   On: 02/04/2020 12:00   [place] EKG SITE RITE  Result Date: 01/23/2020 If [facility] image not attached, placement could not be confirmed due to current cardiac rhythm.   Microbiology: Recent Results (from the past 240 hour(s))  SARS CORONAVIRUS 2 (TAT 6-24  HRS) Nasopharyngeal Nasopharyngeal Swab     Status: None   Collection Time: 02/02/20  1:43 AM   Specimen: Nasopharyngeal Swab  Result Value Ref Range Status   SARS Coronavirus 2 NEGATIVE NEGATIVE Final    Comment: (NOTE) SARS-CoV-2 target nucleic acids are NOT DETECTED. The SARS-CoV-2 RNA is generally detectable in upper and lower respiratory specimens during the acute phase of infection. Negative results do not preclude SARS-CoV-2 infection, do not rule out co-infections with other pathogens, and should not be used as the sole basis for treatment or other patient management decisions. Negative results must be combined with clinical observations, patient history, and epidemiological information. The expected result is Negative. Fact Sheet for Patients: SugarRoll.be Fact Sheet for  Healthcare Providers: https://www.woods-mathews.com/ This test is not yet approved or cleared by the [place] FDA and  has been authorized for detection and/or diagnosis of SARS-CoV-2 by FDA under an Emergency Use Authorization (EUA). This EUA will remain  in effect (meaning this test can be used) for the duration of the COVID-19 declaration under Section 56 4(b)(1) of the Act, 21 U.S.C. section 360bbb-3(b)(1), unless the authorization is terminated or revoked sooner. Performed at [facility], [place] [address] [place], [place] [address]   SARS Coronavirus 2 by RT PCR (hospital order, performed in [facility] hospital lab) Nasopharyngeal Nasopharyngeal Swab     Status: None   Collection Time: 02/06/20 11:03 AM   Specimen: Nasopharyngeal Swab  Result Value Ref Range Status   SARS Coronavirus 2 NEGATIVE NEGATIVE Final    Comment: (NOTE) SARS-CoV-2 target nucleic acids are NOT DETECTED. The SARS-CoV-2 RNA is generally detectable in upper and lower respiratory specimens during the acute phase of infection. The lowest concentration of SARS-CoV-2 viral copies this assay can detect is 250 copies / mL. A negative result does not preclude SARS-CoV-2 infection and should not be used as the sole basis for treatment or other patient management decisions.  A negative result may occur with improper specimen collection / handling, submission of specimen other than nasopharyngeal swab, presence of viral mutation(s) within the areas targeted by this assay, and inadequate number of viral copies (<250 copies / mL). A negative result must be combined with clinical observations, patient history, and epidemiological information. Fact Sheet for Patients:   StrictlyIdeas.no Fact Sheet for Healthcare Providers: BankingDealers.co.za This test is not yet approved or cleared  by the [place] FDA and has been authorized for detection  and/or diagnosis of SARS-CoV-2 by FDA under an Emergency Use Authorization (EUA).  This EUA will remain in effect (meaning this test can be used) for the duration of the COVID-19 declaration under Section 564(b)(1) of the Act, 21 U.S.C. section 360bbb-3(b)(1), unless the authorization is terminated or revoked sooner. Performed at [facility], [place] [address] [place], [place] [address]      Labs: Basic Metabolic Panel: Recent Labs  Lab 02/01/20 1158 02/03/20 0449  NA 139 140  K 3.5 3.8  CL 101 103  CO2 28 27  GLUCOSE 104* 93  BUN 7* <5*  CREATININE 0.86 0.72  CALCIUM 9.0 8.7*   Liver Function Tests: No results for input(s): AST, ALT, ALKPHOS, BILITOT, PROT, ALBUMIN in the last 168 hours. No results for input(s): LIPASE, AMYLASE in the last 168 hours. No results for input(s): AMMONIA in the last 168 hours. CBC: Recent Labs  Lab 02/01/20 1158 02/03/20 0449  WBC 7.5 7.8  HGB 11.4* 11.1*  HCT 37.6 36.3  MCV 100.5* 97.6  PLT 271 270   Cardiac Enzymes: No results for input(s): CKTOTAL, CKMB, CKMBINDEX, TROPONINI in the last 168 hours. BNP: BNP (last 3 results) No results for input(s): BNP in the last 8760 hours.  ProBNP (last 3 results) No results for input(s): PROBNP in the last 8760 hours.  CBG: Recent Labs  Lab 02/01/20 1647  GLUCAP 93    Principal Problem:   Lower extremity weakness Active Problems:   Gastroesophageal reflux disease   Anxiety about health   Macrocytic anemia   Spinal stenosis   Time coordinating discharge: 38 minutes.  Signed:        Gurney Balthazor, DO [facility]  02/06/2020, 5:13 PM

## 2020-02-06 NOTE — Care Management (Signed)
Patient was previously set up with [facility].  Cassie with [facility] aware of patient d/c today.

## 2020-02-06 NOTE — Consult Note (Signed)
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 The major challenges she has faced have been residual gait instability, diffuse neuropathic pain (burning in her skin and dysthesia) and recurrent urinary tract infections related to urinary retention which have recently led to repeat hospital admissions.  Palliative care consultation requested for symptom management and goals of care discussion.  Clinical Assessment and Goals of Care:  I met with Katelyn Lamb and her husband today and introduced the concept of palliative care and how we may be able to help them moving forward. Katelyn Lamb is alert and engaged on my visit. She ambulated with minimal assist to the bathroom while I was in the room. She has good insight into her condition and talks about how difficult it has been to be dependent on others for help and also the agonizing daily pain she experiences from dysesthesias. Her husband is her primary caregiver and he is committed this role but describes how difficulty it has been at home mostly when she gets a UTI which almost always causes her to fall or become confused. Her last UTI was MDR and required a month of IV antibiotics. Katelyn Lamb says her pain is never controlled and it impacts her ability to have QOL- she tells me other doctors have said there is nothing that can treat  the neuropathic pain that she has and it is something she has to live with and tolerate.   Her main goal is to remain at home for all of her care-she wants to avoid rehospitalization and has done CIR and SNF rehab at [facility] in the past. Other than limited home health services they have no in home support or care navigator to help coordinate her different providers and medical treatment recommendations. This has resulted in delays treating her UTIs and pain, also difficult with her mobility to get her to office visits- her husband says virtual visits have been extremely helpful.  Outside of a UTI, her mental status is clear and insightful - she is able to describe her condition in detail and make complex decisions about her care during my visit.    SUMMARY OF RECOMMENDATIONS   1. Neuropathic Pain, related to Transverse Myelitis, Spinal Stenosis, Viral Myelopathy Patient would benefit from specialized care for neuropathic pain- methadone, lidocaine or even ketamine may provide significant relief and improve her functional status. I have discussed a referral to Dr. Clydell Hakim who may be able to help with specialized interventions. Most of these approaches will require and outpatient plan  of care.She is on high dose Duloxetine and  Gabapentin. She uses hydrocodone prn for pain as well.  2. Neurogenic Bladder/Urinary Retention likely secondary to Spinal Cord Syndrome Patient may benefit from urological evaluation and initiation of oxybutynin or tamsulosin- bladder dysfunction is mostl likely a result of her spinal cord lesions.  3. Advance Care Planning/Care Coordination They are interested in completing directives- I recommended completing a MOST.  4. Serious Illness Psychological/Coping Support Patient relies heavily on her faith -she would benefit from counseling and support after discharge- spiritual and emotional- will make referral for palliative services.   Discharge Planning: She is  able to ambulate with minimal assist and her mental status is clear. I believe she can discharge home. She is set to be followed by [facility] through [facility] and this will be extremely helpful for them. I discussed options for specialized neuropathic pain management following discharge and also dicussed advanced care planning.   Home with [place] services  Referral placed to Dr. Clydell Hakim for complex neuropathic pain management  Information shared re: MOST form and HCPOA documentation  Needs urological plan of care prohylactic abx or oxybutynin etc.       Primary Diagnoses: Present on Admission: . Lower extremity weakness . Macrocytic anemia . Anxiety about health . Gastroesophageal reflux disease . Spinal stenosis   I have reviewed the medical record, interviewed the patient and family, and examined the patient. The following aspects are pertinent.  Past Medical History:  Diagnosis Date  . Anxiety   . Back pain   . Gait abnormality 11/14/2016  . Memory difficulty 03/02/2019  . Myelitis due to herpes simplex [facility])    Social History   Socioeconomic History  . Marital status: Married    Spouse name: [name]  . Number of children: 2  . Years of education: 44  . Highest education level: Not on file  Occupational History  . Not on file  Tobacco Use  . Smoking status: Former Research scientist (life sciences)  . Smokeless tobacco: Never Used  . Tobacco comment: 40 years ago   Substance and Sexual Activity  . Alcohol use: No  . Drug use: No  . Sexual activity: Not Currently  Other Topics Concern  . Not on file  Social History Narrative   Lives with husband   Caffeine use: Green tea   Soda ocass   Coffee rare   Right-handed   Social Determinants of Health   Financial Resource Strain:   . Difficulty of Paying Living Expenses:   Food Insecurity:   . Worried About Charity fundraiser in the Last Year:   . Arboriculturist in the Last Year:   Transportation Needs:   . Lexicographer (Medical):   Marland Kitchen Lack of Transportation (Non-Medical):   Physical Activity:   . Days of Exercise per Week:   . Minutes of Exercise per Session:   Stress:   . Feeling of Stress :   Social Connections:   . Frequency of Communication with Friends and Family:   . Frequency of Social Gatherings with Friends and Family:   . Attends Religious Services:   . Active Member of Clubs or Organizations:   . Attends Archivist Meetings:   Marland Kitchen Marital Status:    Family History  Problem Relation Age of Onset  . Hypertension Mother    Scheduled Meds: . acyclovir  400 mg Oral BID  . Chlorhexidine Gluconate Cloth  6 each Topical Daily  . DULoxetine  30 mg Oral q morning - 10a  . DULoxetine  60 mg Oral QHS  . enoxaparin (LOVENOX) injection  40 mg Subcutaneous Q24H  . pantoprazole  40 mg Oral QHS  . pregabalin  100 mg Oral QHS  . QUEtiapine  25 mg Oral QHS  . sodium chloride flush  10-40 mL Intracatheter Q12H  . sodium chloride flush  3 mL Intravenous Q12H   Continuous Infusions:  PRN Meds:.acetaminophen **OR** acetaminophen, albuterol, diazepam, diclofenac Sodium, diphenoxylate-atropine, HYDROcodone-acetaminophen, ondansetron **OR** ondansetron (ZOFRAN) IV, polyethylene glycol, sodium chloride flush Medications Prior to Admission:  Prior to Admission medications   Medication Sig Start Date End Date Taking? Authorizing Provider  acyclovir (ZOVIRAX) 400 MG tablet TAKE 1 TABLET BY MOUTH TWICE A DAY Patient taking differently: Take 400 mg by mouth 2 (two) times daily.  12/29/19  Yes [name], MD  Calcium Carb-Cholecalciferol (CALCIUM 600-D PO) Take 1 tablet by mouth in the morning and at bedtime.   Yes [provider]  conjugated estrogens (PREMARIN) vaginal cream Place 1 Applicatorful vaginally daily as needed (itching).    Yes [provider]  Cyanocobalamin (VITAMIN B-12 PO) Take 1 tablet by mouth daily.   Yes [provider]  diazepam  (VALIUM) 5 MG tablet Take 5 mg by mouth 2 (two) times daily.   Yes [provider]  diclofenac Sodium (VOLTAREN) 1 % GEL Apply 1 application topically daily as needed (pain).   Yes [provider]  diphenoxylate-atropine (LOMOTIL) 2.5-0.025 MG tablet Take 1 tablet by mouth 4 (four) times daily as needed for diarrhea or loose stools.   Yes [provider]  DULoxetine (CYMBALTA) 30 MG capsule TAKE 1 CAPSULE BY MOUTH EVERY DAY Patient taking differently: Take 30 mg by mouth every morning.  09/07/19  Yes [name], MD  DULoxetine (CYMBALTA) 60 MG capsule Take 1 capsule (60 mg total) by mouth at bedtime. 12/14/19  Yes [name], MD  HYDROcodone-acetaminophen [facility]) 10-325 MG tablet Take 1 tablet by mouth every 6 (six) hours as needed for pain. 03/07/18  Yes [provider]  Multiple Vitamins-Minerals (HAIR/SKIN/NAILS/BIOTIN) TABS Take 1 tablet by mouth daily.   Yes [provider]  omega-3 acid ethyl esters (LOVAZA) 1 g capsule Take 1 g by mouth daily.   Yes [provider]  pantoprazole (PROTONIX) 40 MG tablet Take 1 tablet (40 mg total) by mouth daily. Patient taking differently: Take 40 mg by mouth at bedtime.  10/26/16  Yes Angiulli, Lavon Paganini, PA-C  polyethylene glycol (MIRALAX / GLYCOLAX) packet Take 17 g by mouth daily. Patient taking differently: Take 17 g by mouth daily as needed (constipation).  10/26/16  Yes Angiulli, Lavon Paganini, PA-C  pregabalin (LYRICA) 50 MG capsule Take 100 mg by mouth at bedtime. 11/16/19  Yes [provider]  QUEtiapine (SEROQUEL) 25 MG tablet Take 1 tablet (25 mg total) by mouth at bedtime. 11/16/19  Yes [name], MD  sodium chloride flush (NS) 0.9 % SOLN 10-40 mLs by Intracatheter route every 12 (twelve) hours. 01/24/20  Yes Swayze, Ava, DO  sodium chloride flush (NS) 0.9 % SOLN 10-40 mLs by Intracatheter route as needed (flush). 01/24/20  Yes Swayze, Ava, DO  budesonide (PULMICORT) 0.25 MG/2ML  nebulizer solution Take 2 mLs (0.25 mg total) by nebulization 2 (two) times daily. Patient not taking: Reported on 02/02/2020 01/24/20   Swayze, Ava, DO  ipratropium-albuterol (DUONEB) 0.5-2.5 (3) MG/3ML SOLN Take 3 mLs by nebulization every 4 (four) hours as needed (  For SOB and wheezing). Patient not taking: Reported on 12/06/2019 07/25/19 08/24/19  Bonnell Public, MD   Allergies  Allergen Reactions  . Demerol [Meperidine] Other (See Comments)    Hallucinations  . Percocet [Oxycodone-Acetaminophen] Itching  . Amoxicillin-Pot Clavulanate Diarrhea    Severe pain, headache, intestinal infection  . Penicillins Itching and Rash    Has patient had a PCN reaction causing immediate rash, facial/tongue/throat swelling, SOB or lightheadedness with hypotension:  NO Has patient had a PCN reaction causing severe rash involving mucus membranes or skin necrosis: No Has patient had a PCN reaction that required hospitalization: No Has patient had a PCN reaction occurring within the last 10 years: Yes If all of the above answers are "NO", then may proceed with Cephalosporin use.   Review of Systems  Physical Exam  Vital Signs: BP 101/62 (BP Location: Left Arm)   Pulse 85   Temp 98.2 F (36.8 C) (Oral)   Resp 18   Ht _0  (1.651 m)   Wt 68 kg   SpO2 95%   BMI 24.96 kg/m  Pain Scale: 0-10 POSS *See Group Information*: 1-Acceptable,Awake and alert Pain Score: 5    SpO2: SpO2: 95 % O2 Device:SpO2: 95 % O2 Flow Rate: .   IO: Intake/output summary:   Intake/Output Summary (Last 24 hours) at 02/06/2020 0759 Last data filed at 02/06/2020 0500 Gross per 24 hour  Intake 480 ml  Output 200 ml  Net 280 ml    LBM: Last BM Date: 02/02/20 Baseline Weight: Weight: 68 kg Most recent weight: Weight: 68 kg     Palliative Assessment/Data:      Time Total: 70 min Greater than 50%  of this time was spent counseling and coordinating care related to the above assessment and plan.  Signed  by: Lane Hacker, DO   Please contact Palliative Medicine Team phone at [phone] for questions and concerns.  For individual provider: See Shea Evans

## 2020-02-06 NOTE — Progress Notes (Signed)
Occupational Therapy Treatment Patient Details Name: Katelyn Lamb MRN: [id number] DOB: September 24, 1945 Today's Date: 02/06/2020    History of present illness Pt is a 74 y/o female admitted secondary to increased LE weakness and inability to ambulate. Found to be hypotensive upon admission.  MRI brainand entire spine without acute changes, does have chronic severe spinal stenosis in C spine and mild in lumbar.  Had pelvis xray today that revealed Nondisplaced acute to subacute fracture involving the left side ofthe pubic symphysis which extends into the left inferior pubicramus. No weight bearing restrictions at this time.  PMH includes recent UTI, anxiety, and transverse myelitis.   OT comments  Pt admitted with above. Pt currently with functional limitations due to the deficits listed below (see OT Problem List). Pt at this time required min guard from elevated surface to RW with cues for handplacement. Pt ambulated to bathroom and completed oral care in standing with set up to min guard but had to be cued to sitting as unaware about change in standing tolerance as started to flex knees and posterior tilt. Pt cued then complete care in sitting due to tolerance for activity. Pt's husband was educated about safety when completed BUE activity at sink and to have pt in a sitting position to decrease risk of falls. Pt and husband also education in modifications in dressing due to decrease in standing tolerance. Pt completed bed mobility sitting to supine with min guard and increase time to sequence.  Pt will benefit from skilled OT to increase their safety and independence with ADL and functional mobility for ADL to facilitate discharge to venue listed below.     Follow Up Recommendations  SNF;Supervision/Assistance - 24 hour(but family refusing SNF stay)    Equipment Recommendations       Recommendations for Other Services      Precautions / Restrictions Precautions Precautions: Fall Precaution  Comments: watch sats Restrictions Weight Bearing Restrictions: No       Mobility Bed Mobility Overal bed mobility: Needs Assistance Bed Mobility: Supine to Sit;Sit to Supine Rolling: Min assist   Supine to sit: Min assist Sit to supine: Min guard      Transfers Overall transfer level: Needs assistance Equipment used: Rolling walker (2 wheeled) Transfers: Sit to/from Stand Sit to Stand: Min guard         General transfer comment: pt requires constant cues for safety    Balance Overall balance assessment: Needs assistance Sitting-balance support: Feet supported Sitting balance-Leahy Scale: Good   Postural control: Posterior lean Standing balance support: Bilateral upper extremity supported;During functional activity Standing balance-Leahy Scale: Fair Standing balance comment: Pt used RW but able to balance with min guard                           ADL either performed or assessed with clinical judgement   ADL Overall ADL's : Needs assistance/impaired Eating/Feeding: Set up;Sitting   Grooming: Wash/dry hands;Wash/dry face;Sitting;Standing;Set up;Min guard   Upper Body Bathing: Set up;Cueing for sequencing;Sitting   Lower Body Bathing: Moderate assistance;Cueing for safety;Sitting/lateral leans;Sit to/from stand   Upper Body Dressing : Set up;Sitting   Lower Body Dressing: Moderate assistance;Cueing for safety;Cueing for sequencing;Sitting/lateral leans;Sit to/from stand Lower Body Dressing Details (indicate cue type and reason): required assistance with threading BLEs through underwear and pulling over hips once standing with min assist at waistline for balance Toilet Transfer: Minimal assistance;Cueing for safety;Cueing for sequencing;Stand-pivot;BSC;RW   Toileting- Clothing Manipulation and Hygiene:  Moderate assistance;Cueing for safety;Sitting/lateral lean;Sit to/from stand   Tub/ Shower Transfer: Minimal assistance;Cueing for safety;Cueing for  sequencing;Stand-pivot;Shower seat;Grab bars;Rolling walker   Functional mobility during ADLs: Minimal assistance;Cueing for safety;Cueing for sequencing;Rolling walker       Vision   Vision Assessment?: No apparent visual deficits   Perception     Praxis      Cognition Arousal/Alertness: Awake/alert Behavior During Therapy: WFL for tasks assessed/performed Overall Cognitive Status: History of cognitive impairments - at baseline Area of Impairment: Problem solving;Awareness;Safety/judgement;Following commands;Memory;Attention;Orientation                 Orientation Level: Time;Situation Current Attention Level: Selective Memory: Decreased short-term memory Following Commands: Follows one step commands inconsistently;Follows one step commands with increased time Safety/Judgement: Decreased awareness of safety;Decreased awareness of deficits Awareness: Intellectual Problem Solving: Slow processing;Requires verbal cues General Comments: memory deficits at baseline; was oriented to person/place/month but not year today        Exercises     Shoulder Instructions       General Comments      Pertinent Vitals/ Pain       Pain Assessment: 0-10 Pain Score: 6  Pain Location: lower back Pain Descriptors / Indicators: Guarding Pain Intervention(s): Limited activity within patient's tolerance;Monitored during session;Repositioned  Home Living                                          Prior Functioning/Environment              Frequency  Min 2X/week        Progress Toward Goals  OT Goals(current goals can now be found in the care plan section)  Progress towards OT goals: Progressing toward goals  Acute Rehab OT Goals Patient Stated Goal: get home ADL Goals Pt Will Perform Grooming: with modified independence;standing Pt Will Perform Lower Body Bathing: with min guard assist;sitting/lateral leans;sit to/from stand Pt Will Perform Lower  Body Dressing: with min guard assist;sitting/lateral leans;sit to/from stand Pt Will Transfer to Toilet: squat pivot transfer;bedside commode;with supervision Pt Will Perform Tub/Shower Transfer: with min guard assist;Stand pivot transfer;ambulating;shower seat;rolling walker  Plan Discharge plan remains appropriate    Co-evaluation                 AM-PAC OT "6 Clicks" Daily Activity     Outcome Measure   Help from another person eating meals?: None Help from another person taking care of personal grooming?: A Little Help from another person toileting, which includes using toliet, bedpan, or urinal?: A Little Help from another person bathing (including washing, rinsing, drying)?: A Lot Help from another person to put on and taking off regular upper body clothing?: A Little Help from another person to put on and taking off regular lower body clothing?: A Little 6 Click Score: 18    End of Session Equipment Utilized During Treatment: Gait belt;Rolling walker  OT Visit Diagnosis: Unsteadiness on feet (R26.81);Muscle weakness (generalized) (M62.81);Other symptoms and signs involving cognitive function   Activity Tolerance Patient tolerated treatment well;Patient limited by fatigue   Patient Left in bed;with call bell/phone within reach;with family/visitor present   Nurse Communication Mobility status        Time: 7858-8502 OT Time Calculation (min): 32 min  Charges: OT General Charges $OT Visit: 1 Visit OT Treatments $Self Care/Home Management : 23-37 mins  Joeseph Amor OTR/L  Acute Rehab  Services  [phone] office number [phone] pager number    Joeseph Amor 02/06/2020, 12:48 PM

## 2020-02-06 NOTE — Progress Notes (Signed)
PICC line removed by IV team. Will monitor patient for at least 30 minutes. Patient and family aware.

## 2020-02-06 NOTE — TOC Progression Note (Signed)
Transition of Care [facility]) - Progression Note    Patient Details  Name: Katelyn Lamb MRN: [id number] Date of Birth: 1946/07/04  [facility] of Care [facility]) CM/SW [place], [place] Phone Number: 02/06/2020, 11:39 AM  Clinical Narrative:    CSW met with pt and husband, Annie Main bedside. Annie Main and pt decided that they no longer want to go to SNF and they want the pt to go home with home health. Annie Main stated that he will be able to provide 24 hour support to pt and help her with ADL's. CSW explained that [facility] will only come every 2-3 days for therapy only, not personal care. Annie Main stated that he understood.   CSW inquired if pt will need ptar transportation. Annie Main declined and stated that he will be able to get her home by himself.   CSW reached out to CM to arrange [facility] services. CSW is signing off at this time.    Expected Discharge Plan: [place] Barriers to Discharge: Continued Medical Work up  Expected Discharge Plan and Services Expected Discharge Plan: [place]   Discharge Planning Services: CM Consult Post Acute Care Choice: [place] arrangements for the past 2 months: Single Family Home Expected Discharge Date: 02/06/20               DME Arranged: N/A DME Agency: NA       HH Arranged: PT, OT, RN [place] Agency: [facility] Date [place]: 02/02/20 Time [place]: [id number] Representative spoke with at [place]: Cassie   Social Determinants of Health ([place]) Interventions    Readmission Risk Interventions No flowsheet data found.  Emeterio Reeve, Latanya Presser, [place] Social Worker [phone]

## 2020-02-06 NOTE — Progress Notes (Signed)
Physical Therapy Treatment Patient Details Name: Katelyn Lamb MRN: [id number] DOB: 21-Sep-1945 Today's Date: 02/06/2020    History of Present Illness Pt is a 74 y/o female admitted secondary to increased LE weakness and inability to ambulate. Found to be hypotensive upon admission.  MRI brainand entire spine without acute changes, does have chronic severe spinal stenosis in C spine and mild in lumbar.  Had pelvis xray today that revealed Nondisplaced acute to subacute fracture involving the left side ofthe pubic symphysis which extends into the left inferior pubicramus. No weight bearing restrictions at this time.  PMH includes recent UTI, anxiety, and transverse myelitis.    PT Comments    Pt was seen for mobility on RW with husband present during therapy session.  Following her visit pt returned to bed, with CNA called to help her with bath.  Follow up acutely with all strengthening and gait, to increase independence and to support return home with husband.     Follow Up Recommendations  Supervision/Assistance - 24 hour;SNF     Equipment Recommendations  None recommended by PT    Recommendations for Other Services Rehab consult     Precautions / Restrictions Precautions Precautions: Fall Restrictions Weight Bearing Restrictions: No    Mobility  Bed Mobility Overal bed mobility: Needs Assistance Bed Mobility: Supine to Sit;Sit to Supine Rolling: Min assist   Supine to sit: Min assist Sit to supine: Min assist      Transfers Overall transfer level: Needs assistance Equipment used: Rolling walker (2 wheeled) Transfers: Sit to/from Stand Sit to Stand: Min assist Stand pivot transfers: Min assist          Ambulation/Gait Ambulation/Gait assistance: Min guard Gait Distance (Feet): 48 Feet Assistive device: Rolling walker (2 wheeled) Gait Pattern/deviations: Step-through pattern;Decreased stride length;Shuffle     General Gait Details: two walks with sitting rest very  briefly   Stairs             Wheelchair Mobility    Modified Rankin (Stroke Patients Only)       Balance Overall balance assessment: Needs assistance Sitting-balance support: Feet supported Sitting balance-Leahy Scale: Good     Standing balance support: Bilateral upper extremity supported;During functional activity Standing balance-Leahy Scale: Fair                              Cognition Arousal/Alertness: Awake/alert Behavior During Therapy: WFL for tasks assessed/performed Overall Cognitive Status: History of cognitive impairments - at baseline Area of Impairment: Problem solving;Awareness;Safety/judgement;Following commands;Memory;Attention;Orientation                 Orientation Level: Time;Situation Current Attention Level: Selective Memory: Decreased short-term memory Following Commands: Follows one step commands inconsistently;Follows one step commands with increased time Safety/Judgement: Decreased awareness of safety;Decreased awareness of deficits Awareness: Intellectual Problem Solving: Slow processing;Requires verbal cues        Exercises      General Comments        Pertinent Vitals/Pain Pain Assessment: Faces Faces Pain Scale: Hurts a little bit Pain Location: lower back Pain Descriptors / Indicators: Guarding Pain Intervention(s): Limited activity within patient's tolerance;Monitored during session;Premedicated before session;Repositioned    Home Living                      Prior Function            PT Goals (current goals can now be found in the care plan  section) Acute Rehab PT Goals Patient Stated Goal: get home Progress towards PT goals: Progressing toward goals    Frequency    Min 5X/week      PT Plan Current plan remains appropriate    Co-evaluation              AM-PAC PT "6 Clicks" Mobility   Outcome Measure  Help needed turning from your back to your side while in a flat bed  without using bedrails?: A Little Help needed moving from lying on your back to sitting on the side of a flat bed without using bedrails?: A Little Help needed moving to and from a bed to a chair (including a wheelchair)?: A Little Help needed standing up from a chair using your arms (e.g., wheelchair or bedside chair)?: A Little Help needed to walk in hospital room?: A Little Help needed climbing 3-5 steps with a railing? : A Lot 6 Click Score: 17    End of Session Equipment Utilized During Treatment: Gait belt Activity Tolerance: Patient tolerated treatment well Patient left: in bed;with family/visitor present;with call bell/phone within reach Nurse Communication: Mobility status PT Visit Diagnosis: Unsteadiness on feet (R26.81);Muscle weakness (generalized) (M62.81)     Time: 8657-8469 PT Time Calculation (min) (ACUTE ONLY): 32 min  Charges:  $Gait Training: 8-22 mins $Therapeutic Activity: 8-22 mins                     Ramond Dial 02/06/2020, 12:25 AM  Mee Hives, PT MS Acute Rehab Dept. Number: [facility]

## 2020-02-09 DIAGNOSIS — A498 Other bacterial infections of unspecified site: Secondary | ICD-10-CM | POA: Diagnosis not present

## 2020-02-09 DIAGNOSIS — N390 Urinary tract infection, site not specified: Secondary | ICD-10-CM | POA: Diagnosis not present

## 2020-02-11 ENCOUNTER — Telehealth (INDEPENDENT_AMBULATORY_CARE_PROVIDER_SITE_OTHER): Payer: PPO | Admitting: Internal Medicine

## 2020-02-11 ENCOUNTER — Other Ambulatory Visit: Payer: Self-pay

## 2020-02-11 ENCOUNTER — Encounter: Payer: Self-pay | Admitting: Internal Medicine

## 2020-02-11 DIAGNOSIS — N390 Urinary tract infection, site not specified: Secondary | ICD-10-CM

## 2020-02-11 MED ORDER — NITROFURANTOIN MONOHYD MACRO 100 MG PO CAPS
100.0000 mg | ORAL_CAPSULE | Freq: Every day | ORAL | 0 refills | Status: DC
Start: 2020-02-11 — End: 2020-02-17

## 2020-02-11 NOTE — Progress Notes (Signed)
Rfv: follow up for hospitalization- telephone visit  Patient ID: Katelyn Lamb, female   DOB: Mar 14, 1946, 74 y.o.   MRN: [id number]  HPI 74yo F with hx of HSV transverse myelitis complicated by neurogenic bladder and recurrent uti. She was recently treated for 14d course of carbapenem for ESBL klebsiella uti. Near the completion of her abtx course, she had increasing weakness of legs, altered mental status that required her to be readmitted, that was thought possibly due to medications irregularity.   She has started to have tremors, thus they restarted valium. In addition, they think that she may have cloudy urine but she denies discomfort. She denies having urgency  Outpatient Encounter Medications as of 02/11/2020  Medication Sig  . acyclovir (ZOVIRAX) 400 MG tablet TAKE 1 TABLET BY MOUTH TWICE A DAY (Patient taking differently: Take 400 mg by mouth 2 (two) times daily. )  . diazepam (VALIUM) 5 MG tablet Take 1 tablet (5 mg total) by mouth every 12 (twelve) hours as needed for anxiety.  . diclofenac Sodium (VOLTAREN) 1 % GEL Apply 1 application topically daily as needed (pain).  Marland Kitchen diphenoxylate-atropine (LOMOTIL) 2.5-0.025 MG tablet Take 1 tablet by mouth 4 (four) times daily as needed for diarrhea or loose stools.  . DULoxetine (CYMBALTA) 30 MG capsule TAKE 1 CAPSULE BY MOUTH EVERY DAY (Patient taking differently: Take 30 mg by mouth every morning. )  . DULoxetine (CYMBALTA) 60 MG capsule Take 1 capsule (60 mg total) by mouth at bedtime.  Marland Kitchen HYDROcodone-acetaminophen (NORCO) 10-325 MG tablet Take 1 tablet by mouth every 6 (six) hours as needed for pain.  . Multiple Vitamins-Minerals (HAIR/SKIN/NAILS/BIOTIN) TABS Take 1 tablet by mouth daily.  Marland Kitchen omega-3 acid ethyl esters (LOVAZA) 1 g capsule Take 1 g by mouth daily.  . pantoprazole (PROTONIX) 40 MG tablet Take 1 tablet (40 mg total) by mouth daily. (Patient taking differently: Take 40 mg by mouth at bedtime. )  . polyethylene glycol (MIRALAX /  GLYCOLAX) packet Take 17 g by mouth daily. (Patient taking differently: Take 17 g by mouth daily as needed (constipation). )  . pregabalin (LYRICA) 50 MG capsule Take 100 mg by mouth at bedtime.  Marland Kitchen QUEtiapine (SEROQUEL) 25 MG tablet Take 1 tablet (25 mg total) by mouth at bedtime.  Marland Kitchen ipratropium-albuterol (DUONEB) 0.5-2.5 (3) MG/3ML SOLN Take 3 mLs by nebulization every 4 (four) hours as needed (For SOB and wheezing). (Patient not taking: Reported on 12/06/2019)   No facility-administered encounter medications on file as of 02/11/2020.     Patient Active Problem List   Diagnosis Date Noted  . Lower extremity weakness 02/02/2020  . Macrocytic anemia 02/02/2020  . Spinal stenosis 02/02/2020  . History of recurrent UTI (urinary tract infection)   . Bacterial infection due to Klebsiella pneumoniae   . Laceration of left hand 12/09/2019  . DNR (do not resuscitate) 12/09/2019  . Pelvic fracture ([place]) 12/06/2019  . AKI (acute kidney injury) ([place]) 12/06/2019  . Severe sepsis ([place]) 07/23/2019  . UTI (urinary tract infection) 07/23/2019  . Hematuria 07/23/2019  . Acute respiratory failure with hypoxia ([place]) 07/23/2019  . Hypokalemia 07/23/2019  . Acute on chronic anemia 07/23/2019  . Fever 07/23/2019  . Leukocytosis 07/23/2019  . Sepsis ([place]) 07/23/2019  . Generalized anxiety disorder 06/17/2019  . Menopausal sweats 06/17/2019  . Memory difficulty 03/02/2019  . Degenerative spondylolisthesis 09/02/2018  . Delirium 03/26/2018  . SIRS (systemic inflammatory response syndrome) ([place]) 03/26/2018  . Encephalitis due to human herpes simplex virus (HSV)  03/26/2018  . Degeneration of lumbar intervertebral disc 11/08/2017  . Lumbar radiculopathy 11/06/2017  . Chronic neck pain 09/12/2017  . Chronic low back pain 09/06/2017  . Chronic pain syndrome 09/06/2017  . Dilated pancreatic duct 01/24/2017  . DVT, lower extremity, distal, chronic ([place]) 01/22/2017  . Elevated serum GGT level 01/22/2017  .  Medication monitoring encounter 01/08/2017  . Gait abnormality 11/14/2016  . Hypotension due to drugs   . Urinary retention   . Anxiety about health   . Reactive depression   . Ataxia   . Abdominal spasms   . Constipation due to pain medication   . Acute lower UTI   . Dysuria   . Acute deep vein thrombosis (DVT) of popliteal vein of left lower extremity ([place])   . Incomplete paraplegia ([place])   . Acute blood loss anemia   . Neurogenic bladder   . Neuropathic pain   . Muscle spasm   . Gastroesophageal reflux disease   . Slow transit constipation   . Thrombocytopenia ([place]) 10/03/2016  . Abnormal MRI, spinal cord   . Encephalomyelitis   . Numbness   . Intractable back pain 09/20/2016  . Numbness of left lower extremity 09/20/2016  . Hyponatremia 09/20/2016  . Herpes zoster without complication 64/40/3474  . Spondylosis of cervical region without myelopathy or radiculopathy 06/16/2015     Health Maintenance Due  Topic Date Due  . TETANUS/TDAP  Never done  . COLONOSCOPY  Never done  . MAMMOGRAM  02/23/2010  . DEXA SCAN  Never done     Review of Systems  Physical Exam   There were no vitals taken for this visit.    Lab Results  Component Value Date   LABRPR Non Reactive 03/02/2019    CBC Lab Results  Component Value Date   WBC 7.8 02/03/2020   RBC 3.72 (L) 02/03/2020   HGB 11.1 (L) 02/03/2020   HCT 36.3 02/03/2020   PLT 270 02/03/2020   MCV 97.6 02/03/2020   MCH 29.8 02/03/2020   MCHC 30.6 02/03/2020   RDW 13.3 02/03/2020   LYMPHSABS 2.2 01/23/2020   MONOABS 0.5 01/23/2020   EOSABS 0.5 01/23/2020    BMET Lab Results  Component Value Date   NA 140 02/03/2020   K 3.8 02/03/2020   CL 103 02/03/2020   CO2 27 02/03/2020   GLUCOSE 93 02/03/2020   BUN <5 (L) 02/03/2020   CREATININE 0.72 02/03/2020   CALCIUM 8.7 (L) 02/03/2020   GFRNONAA >60 02/03/2020   GFRAA >60 02/03/2020      Assessment and Plan  Recurrent uti = they have establised care with  urologist at [facility] on [address] [facility] and recommended to have them see dr Amalia Hailey (they have not met yet), or other urologist who is can deal with neurogenic bladder.   Will start her on #30 of macrobid once a day for chronic suppressiion  rtc 2-3wk

## 2020-02-12 ENCOUNTER — Inpatient Hospital Stay (HOSPITAL_COMMUNITY): Payer: PPO

## 2020-02-12 ENCOUNTER — Emergency Department (HOSPITAL_COMMUNITY): Payer: PPO

## 2020-02-12 ENCOUNTER — Encounter (HOSPITAL_COMMUNITY): Payer: Self-pay | Admitting: Student

## 2020-02-12 ENCOUNTER — Telehealth: Payer: Self-pay

## 2020-02-12 ENCOUNTER — Inpatient Hospital Stay (HOSPITAL_COMMUNITY)
Admission: AD | Admit: 2020-02-12 | Discharge: 2020-02-17 | DRG: 871 | Disposition: A | Discharge status: Home Health | Payer: PPO | Attending: Internal Medicine | Admitting: Internal Medicine

## 2020-02-12 ENCOUNTER — Other Ambulatory Visit: Payer: Self-pay

## 2020-02-12 DIAGNOSIS — R0689 Other abnormalities of breathing: Secondary | ICD-10-CM | POA: Diagnosis not present

## 2020-02-12 DIAGNOSIS — B029 Zoster without complications: Secondary | ICD-10-CM | POA: Diagnosis present

## 2020-02-12 DIAGNOSIS — N319 Neuromuscular dysfunction of bladder, unspecified: Secondary | ICD-10-CM | POA: Diagnosis not present

## 2020-02-12 DIAGNOSIS — E872 Acidosis: Secondary | ICD-10-CM | POA: Diagnosis present

## 2020-02-12 DIAGNOSIS — F418 Other specified anxiety disorders: Secondary | ICD-10-CM

## 2020-02-12 DIAGNOSIS — G8222 Paraplegia, incomplete: Secondary | ICD-10-CM | POA: Diagnosis present

## 2020-02-12 DIAGNOSIS — J9601 Acute respiratory failure with hypoxia: Secondary | ICD-10-CM | POA: Diagnosis present

## 2020-02-12 DIAGNOSIS — M542 Cervicalgia: Secondary | ICD-10-CM | POA: Diagnosis present

## 2020-02-12 DIAGNOSIS — R6520 Severe sepsis without septic shock: Secondary | ICD-10-CM | POA: Diagnosis not present

## 2020-02-12 DIAGNOSIS — Z885 Allergy status to narcotic agent status: Secondary | ICD-10-CM

## 2020-02-12 DIAGNOSIS — G9341 Metabolic encephalopathy: Secondary | ICD-10-CM | POA: Diagnosis not present

## 2020-02-12 DIAGNOSIS — G894 Chronic pain syndrome: Secondary | ICD-10-CM | POA: Diagnosis not present

## 2020-02-12 DIAGNOSIS — R270 Ataxia, unspecified: Secondary | ICD-10-CM | POA: Diagnosis not present

## 2020-02-12 DIAGNOSIS — N3000 Acute cystitis without hematuria: Secondary | ICD-10-CM | POA: Diagnosis not present

## 2020-02-12 DIAGNOSIS — R7881 Bacteremia: Secondary | ICD-10-CM

## 2020-02-12 DIAGNOSIS — N179 Acute kidney failure, unspecified: Secondary | ICD-10-CM | POA: Diagnosis not present

## 2020-02-12 DIAGNOSIS — B004 Herpesviral encephalitis: Secondary | ICD-10-CM

## 2020-02-12 DIAGNOSIS — R410 Disorientation, unspecified: Secondary | ICD-10-CM | POA: Diagnosis not present

## 2020-02-12 DIAGNOSIS — R6521 Severe sepsis with septic shock: Secondary | ICD-10-CM | POA: Diagnosis present

## 2020-02-12 DIAGNOSIS — K219 Gastro-esophageal reflux disease without esophagitis: Secondary | ICD-10-CM | POA: Diagnosis present

## 2020-02-12 DIAGNOSIS — M4800 Spinal stenosis, site unspecified: Secondary | ICD-10-CM | POA: Diagnosis present

## 2020-02-12 DIAGNOSIS — Z20822 Contact with and (suspected) exposure to covid-19: Secondary | ICD-10-CM | POA: Diagnosis present

## 2020-02-12 DIAGNOSIS — I82432 Acute embolism and thrombosis of left popliteal vein: Secondary | ICD-10-CM | POA: Diagnosis not present

## 2020-02-12 DIAGNOSIS — Z66 Do not resuscitate: Secondary | ICD-10-CM | POA: Diagnosis present

## 2020-02-12 DIAGNOSIS — Z87891 Personal history of nicotine dependence: Secondary | ICD-10-CM

## 2020-02-12 DIAGNOSIS — R000 Tachycardia, unspecified: Secondary | ICD-10-CM | POA: Diagnosis not present

## 2020-02-12 DIAGNOSIS — R0902 Hypoxemia: Secondary | ICD-10-CM | POA: Diagnosis not present

## 2020-02-12 DIAGNOSIS — Z86718 Personal history of other venous thrombosis and embolism: Secondary | ICD-10-CM

## 2020-02-12 DIAGNOSIS — D696 Thrombocytopenia, unspecified: Secondary | ICD-10-CM | POA: Diagnosis present

## 2020-02-12 DIAGNOSIS — F411 Generalized anxiety disorder: Secondary | ICD-10-CM | POA: Diagnosis present

## 2020-02-12 DIAGNOSIS — R062 Wheezing: Secondary | ICD-10-CM | POA: Diagnosis not present

## 2020-02-12 DIAGNOSIS — I2699 Other pulmonary embolism without acute cor pulmonale: Secondary | ICD-10-CM | POA: Diagnosis not present

## 2020-02-12 DIAGNOSIS — I10 Essential (primary) hypertension: Secondary | ICD-10-CM | POA: Diagnosis not present

## 2020-02-12 DIAGNOSIS — F329 Major depressive disorder, single episode, unspecified: Secondary | ICD-10-CM | POA: Diagnosis present

## 2020-02-12 DIAGNOSIS — L89616 Pressure-induced deep tissue damage of right heel: Secondary | ICD-10-CM | POA: Diagnosis present

## 2020-02-12 DIAGNOSIS — R197 Diarrhea, unspecified: Secondary | ICD-10-CM | POA: Diagnosis not present

## 2020-02-12 DIAGNOSIS — Z888 Allergy status to other drugs, medicaments and biological substances status: Secondary | ICD-10-CM

## 2020-02-12 DIAGNOSIS — G8929 Other chronic pain: Secondary | ICD-10-CM

## 2020-02-12 DIAGNOSIS — R6510 Systemic inflammatory response syndrome (SIRS) of non-infectious origin without acute organ dysfunction: Secondary | ICD-10-CM | POA: Diagnosis present

## 2020-02-12 DIAGNOSIS — Z881 Allergy status to other antibiotic agents status: Secondary | ICD-10-CM

## 2020-02-12 DIAGNOSIS — R509 Fever, unspecified: Secondary | ICD-10-CM | POA: Diagnosis not present

## 2020-02-12 DIAGNOSIS — Z79899 Other long term (current) drug therapy: Secondary | ICD-10-CM

## 2020-02-12 DIAGNOSIS — I825Z9 Chronic embolism and thrombosis of unspecified deep veins of unspecified distal lower extremity: Secondary | ICD-10-CM | POA: Diagnosis present

## 2020-02-12 DIAGNOSIS — A419 Sepsis, unspecified organism: Principal | ICD-10-CM | POA: Diagnosis present

## 2020-02-12 DIAGNOSIS — Z87440 Personal history of urinary (tract) infections: Secondary | ICD-10-CM

## 2020-02-12 DIAGNOSIS — Z209 Contact with and (suspected) exposure to unspecified communicable disease: Secondary | ICD-10-CM | POA: Diagnosis not present

## 2020-02-12 DIAGNOSIS — N390 Urinary tract infection, site not specified: Secondary | ICD-10-CM | POA: Diagnosis present

## 2020-02-12 DIAGNOSIS — Z9851 Tubal ligation status: Secondary | ICD-10-CM

## 2020-02-12 DIAGNOSIS — Z880 Allergy status to penicillin: Secondary | ICD-10-CM

## 2020-02-12 LAB — BLOOD GAS, ARTERIAL
Acid-base deficit: 0.9 mmol/L (ref 0.0–2.0)
Bicarbonate: 24.4 mmol/L (ref 20.0–28.0)
O2 Saturation: 96.2 %
Patient temperature: 98.6
pCO2 arterial: 46.1 mmHg (ref 32.0–48.0)
pH, Arterial: 7.343 — ABNORMAL LOW (ref 7.350–7.450)
pO2, Arterial: 87.8 mmHg (ref 83.0–108.0)

## 2020-02-12 LAB — COMPREHENSIVE METABOLIC PANEL
ALT: 26 U/L (ref 0–44)
AST: 49 U/L — ABNORMAL HIGH (ref 15–41)
Albumin: 3.6 g/dL (ref 3.5–5.0)
Alkaline Phosphatase: 160 U/L — ABNORMAL HIGH (ref 38–126)
Anion gap: 11 (ref 5–15)
BUN: 10 mg/dL (ref 8–23)
CO2: 26 mmol/L (ref 22–32)
Calcium: 8.4 mg/dL — ABNORMAL LOW (ref 8.9–10.3)
Chloride: 101 mmol/L (ref 98–111)
Creatinine, Ser: 1.05 mg/dL — ABNORMAL HIGH (ref 0.44–1.00)
GFR calc Af Amer: >60 mL/min (ref >60)
GFR calc non Af Amer: 53 mL/min — ABNORMAL LOW (ref >60)
Glucose, Bld: 126 mg/dL — ABNORMAL HIGH (ref 70–99)
Potassium: 3.7 mmol/L (ref 3.5–5.1)
Sodium: 138 mmol/L (ref 135–145)
Total Bilirubin: 0.6 mg/dL (ref 0.3–1.2)
Total Protein: 6.9 g/dL (ref 6.5–8.1)

## 2020-02-12 LAB — URINALYSIS, ROUTINE W REFLEX MICROSCOPIC
Bilirubin Urine: NEGATIVE
Glucose, UA: NEGATIVE mg/dL
Hgb urine dipstick: NEGATIVE
Ketones, ur: NEGATIVE mg/dL
Nitrite: NEGATIVE
Protein, ur: 30 mg/dL — AB
Specific Gravity, Urine: 1.01 (ref 1.005–1.030)
WBC, UA: >50 WBC/hpf — ABNORMAL HIGH (ref 0–5)
pH: 7 (ref 5.0–8.0)

## 2020-02-12 LAB — CBC WITH DIFFERENTIAL/PLATELET
Abs Immature Granulocytes: 0.05 10*3/uL (ref 0.00–0.07)
Basophils Absolute: 0 10*3/uL (ref 0.0–0.1)
Basophils Relative: 0 %
Eosinophils Absolute: 0.2 10*3/uL (ref 0.0–0.5)
Eosinophils Relative: 2 %
HCT: 37.2 % (ref 36.0–46.0)
Hemoglobin: 11.5 g/dL — ABNORMAL LOW (ref 12.0–15.0)
Immature Granulocytes: 0 %
Lymphocytes Relative: 11 %
Lymphs Abs: 1.4 10*3/uL (ref 0.7–4.0)
MCH: 30.3 pg (ref 26.0–34.0)
MCHC: 30.9 g/dL (ref 30.0–36.0)
MCV: 97.9 fL (ref 80.0–100.0)
Monocytes Absolute: 0.5 10*3/uL (ref 0.1–1.0)
Monocytes Relative: 4 %
Neutro Abs: 10 10*3/uL — ABNORMAL HIGH (ref 1.7–7.7)
Neutrophils Relative %: 83 %
Platelets: 249 10*3/uL (ref 150–400)
RBC: 3.8 MIL/uL — ABNORMAL LOW (ref 3.87–5.11)
RDW: 13.1 % (ref 11.5–15.5)
WBC: 12.2 10*3/uL — ABNORMAL HIGH (ref 4.0–10.5)
nRBC: 0 % (ref 0.0–0.2)

## 2020-02-12 LAB — ECHOCARDIOGRAM COMPLETE
Height: 65 in
Weight: 2582.4 oz

## 2020-02-12 LAB — TSH: TSH: 1.734 u[IU]/mL (ref 0.350–4.500)

## 2020-02-12 LAB — MRSA PCR SCREENING: MRSA by PCR: NEGATIVE

## 2020-02-12 LAB — LACTIC ACID, PLASMA
Lactic Acid, Venous: 2.2 mmol/L (ref 0.5–1.9)
Lactic Acid, Venous: 2.7 mmol/L (ref 0.5–1.9)
Lactic Acid, Venous: 1.6 mmol/L (ref 0.5–1.9)
Lactic Acid, Venous: 1.7 mmol/L (ref 0.5–1.9)
Lactic Acid, Venous: 2.1 mmol/L (ref 0.5–1.9)
Lactic Acid, Venous: 1.1 mmol/L (ref 0.5–1.9)
Lactic Acid, Venous: 2.5 mmol/L (ref 0.5–1.9)

## 2020-02-12 LAB — PROTIME-INR
INR: 1.1 (ref 0.8–1.2)
Prothrombin Time: 13.7 seconds (ref 11.4–15.2)

## 2020-02-12 LAB — SARS CORONAVIRUS 2 BY RT PCR (HOSPITAL ORDER, PERFORMED IN ~~LOC~~ HOSPITAL LAB): SARS Coronavirus 2: NEGATIVE

## 2020-02-12 LAB — APTT: aPTT: 31 seconds (ref 24–36)

## 2020-02-12 MED ORDER — SODIUM CHLORIDE 0.9 % IV SOLN
1.0000 g | Freq: Two times a day (BID) | INTRAVENOUS | Status: DC
  Administered 2020-02-12: 1 g via INTRAVENOUS
  Filled 2020-02-12 (×2): qty 1

## 2020-02-12 MED ORDER — NOREPINEPHRINE 4 MG/250ML-% IV SOLN
2.0000 ug/min | INTRAVENOUS | Status: DC
  Administered 2020-02-12: 2 ug/min via INTRAVENOUS

## 2020-02-12 MED ORDER — NOREPINEPHRINE 4 MG/250ML-% IV SOLN: 0.0000 ug/min | INTRAVENOUS | Status: DC

## 2020-02-12 MED ORDER — IPRATROPIUM-ALBUTEROL 0.5-2.5 (3) MG/3ML IN SOLN: 3.0000 mL | Freq: Four times a day (QID) | RESPIRATORY_TRACT | Status: DC | PRN

## 2020-02-12 MED ORDER — PANTOPRAZOLE SODIUM 40 MG PO TBEC
40.0000 mg | DELAYED_RELEASE_TABLET | Freq: Every day | ORAL | Status: DC
  Administered 2020-02-12 – 2020-02-16 (×5): 40 mg via ORAL
  Filled 2020-02-12 (×5): qty 1

## 2020-02-12 MED ORDER — SODIUM CHLORIDE 0.9 % IV SOLN
1.0000 g | Freq: Once | INTRAVENOUS | Status: AC
  Administered 2020-02-12: 1 g via INTRAVENOUS
  Filled 2020-02-12: qty 1

## 2020-02-12 MED ORDER — HAIR/SKIN/NAILS/BIOTIN PO TABS: 1.0000 | ORAL_TABLET | Freq: Every day | ORAL | Status: DC

## 2020-02-12 MED ORDER — IPRATROPIUM-ALBUTEROL 0.5-2.5 (3) MG/3ML IN SOLN: 3.0000 mL | Freq: Two times a day (BID) | RESPIRATORY_TRACT | Status: DC

## 2020-02-12 MED ORDER — CHLORHEXIDINE GLUCONATE CLOTH 2 % EX PADS
6.0000 | MEDICATED_PAD | Freq: Every day | CUTANEOUS | Status: DC
  Administered 2020-02-13 – 2020-02-17 (×5): 6 via TOPICAL

## 2020-02-12 MED ORDER — PREGABALIN 50 MG PO CAPS
100.0000 mg | ORAL_CAPSULE | Freq: Every day | ORAL | Status: DC
  Administered 2020-02-12 – 2020-02-16 (×5): 100 mg via ORAL
  Filled 2020-02-12 (×5): qty 2

## 2020-02-12 MED ORDER — DULOXETINE HCL 60 MG PO CPEP
60.0000 mg | ORAL_CAPSULE | Freq: Every day | ORAL | Status: DC
  Administered 2020-02-12 – 2020-02-16 (×5): 60 mg via ORAL
  Filled 2020-02-12 (×3): qty 1
  Filled 2020-02-12 (×2): qty 2

## 2020-02-12 MED ORDER — SODIUM CHLORIDE 0.9 % IV BOLUS: 1000.0000 mL | Freq: Once | INTRAVENOUS | Status: DC

## 2020-02-12 MED ORDER — ACETAMINOPHEN 500 MG PO TABS
1000.0000 mg | ORAL_TABLET | Freq: Once | ORAL | Status: AC
  Administered 2020-02-12: 1000 mg via ORAL
  Filled 2020-02-12: qty 2

## 2020-02-12 MED ORDER — SODIUM CHLORIDE (PF) 0.9 % IJ SOLN
INTRAMUSCULAR | Status: AC
  Filled 2020-02-12: qty 50

## 2020-02-12 MED ORDER — IPRATROPIUM-ALBUTEROL 0.5-2.5 (3) MG/3ML IN SOLN
3.0000 mL | Freq: Four times a day (QID) | RESPIRATORY_TRACT | Status: DC
  Administered 2020-02-12: 3 mL via RESPIRATORY_TRACT
  Filled 2020-02-12: qty 3

## 2020-02-12 MED ORDER — POLYETHYLENE GLYCOL 3350 17 G PO PACK: 17.0000 g | PACK | Freq: Every day | ORAL | Status: DC | PRN

## 2020-02-12 MED ORDER — METRONIDAZOLE IN NACL 5-0.79 MG/ML-% IV SOLN
500.0000 mg | Freq: Once | INTRAVENOUS | Status: AC
  Administered 2020-02-12: 500 mg via INTRAVENOUS
  Filled 2020-02-12: qty 100

## 2020-02-12 MED ORDER — SODIUM CHLORIDE 0.9 % IV SOLN: INTRAVENOUS | Status: DC | PRN

## 2020-02-12 MED ORDER — QUETIAPINE FUMARATE 25 MG PO TABS
25.0000 mg | ORAL_TABLET | Freq: Every evening | ORAL | Status: DC | PRN
  Administered 2020-02-13 – 2020-02-17 (×5): 25 mg via ORAL
  Filled 2020-02-12 (×5): qty 1

## 2020-02-12 MED ORDER — IPRATROPIUM-ALBUTEROL 0.5-2.5 (3) MG/3ML IN SOLN
3.0000 mL | Freq: Once | RESPIRATORY_TRACT | Status: AC
  Administered 2020-02-12: 3 mL via RESPIRATORY_TRACT
  Filled 2020-02-12: qty 3

## 2020-02-12 MED ORDER — SODIUM CHLORIDE 0.9 % IV SOLN: INTRAVENOUS | Status: DC

## 2020-02-12 MED ORDER — ENOXAPARIN SODIUM 80 MG/0.8ML ~~LOC~~ SOLN
1.0000 mg/kg | Freq: Two times a day (BID) | SUBCUTANEOUS | Status: DC
  Administered 2020-02-12 – 2020-02-14 (×5): 75 mg via SUBCUTANEOUS
  Filled 2020-02-12 (×6): qty 0.75

## 2020-02-12 MED ORDER — SODIUM CHLORIDE 0.9 % IV BOLUS
500.0000 mL | Freq: Once | INTRAVENOUS | Status: AC
  Administered 2020-02-12: 500 mL via INTRAVENOUS

## 2020-02-12 MED ORDER — DULOXETINE HCL 30 MG PO CPEP
30.0000 mg | ORAL_CAPSULE | Freq: Every morning | ORAL | Status: DC
  Administered 2020-02-13 – 2020-02-17 (×5): 30 mg via ORAL
  Filled 2020-02-12 (×6): qty 1

## 2020-02-12 MED ORDER — LACTATED RINGERS IV BOLUS
1000.0000 mL | Freq: Once | INTRAVENOUS | Status: AC
  Administered 2020-02-12: 1000 mL via INTRAVENOUS

## 2020-02-12 MED ORDER — VANCOMYCIN HCL IN DEXTROSE 1-5 GM/200ML-% IV SOLN
1000.0000 mg | Freq: Once | INTRAVENOUS | Status: AC
  Administered 2020-02-12: 1000 mg via INTRAVENOUS
  Filled 2020-02-12: qty 200

## 2020-02-12 MED ORDER — SODIUM CHLORIDE 0.9 % IV SOLN: 250.0000 mL | INTRAVENOUS | Status: DC

## 2020-02-12 MED ORDER — NOREPINEPHRINE 4 MG/250ML-% IV SOLN: 2.0000 ug/min | INTRAVENOUS | Status: DC

## 2020-02-12 MED ORDER — SODIUM CHLORIDE 0.9 % IV SOLN
2.0000 g | Freq: Once | INTRAVENOUS | Status: AC
  Administered 2020-02-12: 2 g via INTRAVENOUS
  Filled 2020-02-12: qty 2

## 2020-02-12 MED ORDER — OMEGA-3-ACID ETHYL ESTERS 1 G PO CAPS
1.0000 g | ORAL_CAPSULE | Freq: Every day | ORAL | Status: DC
  Administered 2020-02-13 – 2020-02-17 (×5): 1 g via ORAL
  Filled 2020-02-12 (×7): qty 1

## 2020-02-12 MED ORDER — TAMSULOSIN HCL 0.4 MG PO CAPS
0.4000 mg | ORAL_CAPSULE | Freq: Every day | ORAL | Status: DC
  Administered 2020-02-12 – 2020-02-17 (×6): 0.4 mg via ORAL
  Filled 2020-02-12 (×6): qty 1

## 2020-02-12 MED ORDER — IOHEXOL 350 MG/ML SOLN
100.0000 mL | Freq: Once | INTRAVENOUS | Status: AC | PRN
  Administered 2020-02-12: 80 mL via INTRAVENOUS

## 2020-02-12 MED ORDER — ALBUTEROL SULFATE (2.5 MG/3ML) 0.083% IN NEBU: 2.5000 mg | INHALATION_SOLUTION | Freq: Four times a day (QID) | RESPIRATORY_TRACT | Status: DC | PRN

## 2020-02-12 MED ORDER — ENOXAPARIN SODIUM 40 MG/0.4ML ~~LOC~~ SOLN: 40.0000 mg | SUBCUTANEOUS | Status: DC

## 2020-02-12 MED ORDER — ORAL CARE MOUTH RINSE
15.0000 mL | Freq: Two times a day (BID) | OROMUCOSAL | Status: DC
  Administered 2020-02-12 – 2020-02-17 (×11): 15 mL via OROMUCOSAL

## 2020-02-12 MED ORDER — NOREPINEPHRINE 4 MG/250ML-% IV SOLN
INTRAVENOUS | Status: AC
  Filled 2020-02-12: qty 250

## 2020-02-12 MED ORDER — ACETAMINOPHEN 650 MG RE SUPP: 650.0000 mg | Freq: Once | RECTAL | Status: AC

## 2020-02-12 MED ORDER — SODIUM CHLORIDE 0.9 % IV BOLUS
1000.0000 mL | Freq: Once | INTRAVENOUS | Status: AC
  Administered 2020-02-12: 1000 mL via INTRAVENOUS

## 2020-02-12 NOTE — ED Provider Notes (Signed)
[place] DEPT Provider Note   CSN: [id number] Arrival date & time: 02/12/20  0501     History Chief Complaint  Patient presents with   Fever    Katelyn Lamb is a 74 y.o. female with a history of herpes zoster with prior encephalitis, thrombocytopenia, GERD, anemia, prior DVT, transverse myelitis, recurrent UTI, and sepsis who presents to the emergency department via EMS for altered mental status and fever. Sxs began at midnight.  Per EMS they received a call from patient's husband whom she lives with at home that the patient was altered and felt hot to the touch.  He described her altered mental status as speaking nonsensically about things like raccoons.  Upon EMS arrival patient was febrile to 102, she was tachycardic, and noted to be hypoxic in the mid 80s on room air improved with application of 15 L nonrebreather.  Patient reports to me that she has had a cough, she has no other specific complaints but does not necessarily consistently answer questions. She denies headache, chest pain, dyspnea, abdominal pain, dysuria, melena, or hematochezia.  Level 5 caveat applies secondary to altered mental status. EMS administered 1L NS en route. Patient's husband is reported to be en route to the hospital. Patient has had both doses of her COVID 19 vaccine.   Chart reviewed for additional history, patient has had multiple recent hospital admissions, most recently discharged 02/07/20 after admission for ambulatory dysfunction, neurology and orthopedic surgery were consulted, initially plan was for SNF placement, however then was prefer that she be discharged home with PT/OT.  Has had issues with recurrent UTIs, recent ESBL UTI.  HPI     Past Medical History:  Diagnosis Date   Anxiety    Back pain    Gait abnormality 11/14/2016   Memory difficulty 03/02/2019   Myelitis due to herpes simplex [facility])     Patient Active Problem List   Diagnosis Date Noted    Lower extremity weakness 02/02/2020   Macrocytic anemia 02/02/2020   Spinal stenosis 02/02/2020   History of recurrent UTI (urinary tract infection)    Bacterial infection due to Klebsiella pneumoniae    Laceration of left hand 12/09/2019   DNR (do not resuscitate) 12/09/2019   Pelvic fracture ([place]) 12/06/2019   AKI (acute kidney injury) ([place]) 12/06/2019   Severe sepsis ([place]) 07/23/2019   UTI (urinary tract infection) 07/23/2019   Hematuria 07/23/2019   Acute respiratory failure with hypoxia ([place]) 07/23/2019   Hypokalemia 07/23/2019   Acute on chronic anemia 07/23/2019   Fever 07/23/2019   Leukocytosis 07/23/2019   Sepsis ([place]) 07/23/2019   Generalized anxiety disorder 06/17/2019   Menopausal sweats 06/17/2019   Memory difficulty 03/02/2019   Degenerative spondylolisthesis 09/02/2018   Delirium 03/26/2018   SIRS (systemic inflammatory response syndrome) ([place]) 03/26/2018   Encephalitis due to human herpes simplex virus (HSV) 03/26/2018   Degeneration of lumbar intervertebral disc 11/08/2017   Lumbar radiculopathy 11/06/2017   Chronic neck pain 09/12/2017   Chronic low back pain 09/06/2017   Chronic pain syndrome 09/06/2017   Dilated pancreatic duct 01/24/2017   DVT, lower extremity, distal, chronic ([place]) 01/22/2017   Elevated serum GGT level 01/22/2017   Medication monitoring encounter 01/08/2017   Gait abnormality 11/14/2016   Hypotension due to drugs    Urinary retention    Anxiety about health    Reactive depression    Ataxia    Abdominal spasms    Constipation due to pain medication  Acute lower UTI    Dysuria    Acute deep vein thrombosis (DVT) of popliteal vein of left lower extremity (HCC)    Incomplete paraplegia (HCC)    Acute blood loss anemia    Neurogenic bladder    Neuropathic pain    Muscle spasm    Gastroesophageal reflux disease    Slow transit constipation    Thrombocytopenia (HCC) 10/03/2016    Abnormal MRI, spinal cord    Encephalomyelitis    Numbness    Intractable back pain 09/20/2016   Numbness of left lower extremity 09/20/2016   Hyponatremia 09/20/2016   Herpes zoster without complication 54/65/6812   Spondylosis of cervical region without myelopathy or radiculopathy 06/16/2015    Past Surgical History:  Procedure Laterality Date   ABDOMINAL HYSTERECTOMY     BLADDER REPAIR     CESAREAN SECTION     IR KYPHO LUMBAR INC FX REDUCE BONE BX UNI/BIL CANNULATION INC/IMAGING  04/11/2018   TUBAL LIGATION       OB History   No obstetric history on file.     Family History  Problem Relation Age of Onset   Hypertension Mother     Social History   Tobacco Use   Smoking status: Former Smoker   Smokeless tobacco: Never Used   Tobacco comment: 40 years ago   Vaping Use   Vaping Use: Never used  Substance Use Topics   Alcohol use: No   Drug use: No    Home Medications Prior to Admission medications   Medication Sig Start Date End Date Taking? Authorizing Provider  acyclovir (ZOVIRAX) 400 MG tablet TAKE 1 TABLET BY MOUTH TWICE A DAY Patient taking differently: Take 400 mg by mouth 2 (two) times daily.  12/29/19   [name], MD  diazepam (VALIUM) 5 MG tablet Take 1 tablet (5 mg total) by mouth every 12 (twelve) hours as needed for anxiety. 02/06/20   Swayze, Ava, DO  diclofenac Sodium (VOLTAREN) 1 % GEL Apply 1 application topically daily as needed (pain).    [provider]  diphenoxylate-atropine (LOMOTIL) 2.5-0.025 MG tablet Take 1 tablet by mouth 4 (four) times daily as needed for diarrhea or loose stools.    [provider]  DULoxetine (CYMBALTA) 30 MG capsule TAKE 1 CAPSULE BY MOUTH EVERY DAY Patient taking differently: Take 30 mg by mouth every morning.  09/07/19   [name], MD  DULoxetine (CYMBALTA) 60 MG capsule Take 1 capsule (60 mg total) by mouth at bedtime. 12/14/19   [name], MD    HYDROcodone-acetaminophen [facility]) 10-325 MG tablet Take 1 tablet by mouth every 6 (six) hours as needed for pain. 03/07/18   [provider]  ipratropium-albuterol (DUONEB) 0.5-2.5 (3) MG/3ML SOLN Take 3 mLs by nebulization every 4 (four) hours as needed (For SOB and wheezing). Patient not taking: Reported on 12/06/2019 07/25/19 08/24/19  Dana Allan I, MD  Multiple Vitamins-Minerals (HAIR/SKIN/NAILS/BIOTIN) TABS Take 1 tablet by mouth daily.    [provider]  nitrofurantoin, macrocrystal-monohydrate, (MACROBID) 100 MG capsule Take 1 capsule (100 mg total) by mouth at bedtime. 02/11/20   [name], MD  omega-3 acid ethyl esters (LOVAZA) 1 g capsule Take 1 g by mouth daily.    [provider]  pantoprazole (PROTONIX) 40 MG tablet Take 1 tablet (40 mg total) by mouth daily. Patient taking differently: Take 40 mg by mouth at bedtime.  10/26/16   Angiulli, Lavon Paganini, PA-C  polyethylene glycol (MIRALAX / Floria Raveling) packet  Take 17 g by mouth daily. Patient taking differently: Take 17 g by mouth daily as needed (constipation).  10/26/16   Angiulli, Lavon Paganini, PA-C  pregabalin (LYRICA) 50 MG capsule Take 100 mg by mouth at bedtime. 11/16/19   [provider]  QUEtiapine (SEROQUEL) 25 MG tablet Take 1 tablet (25 mg total) by mouth at bedtime. 11/16/19   [name], MD    Allergies    Demerol [meperidine], Percocet [oxycodone-acetaminophen], Amoxicillin-pot clavulanate, and Penicillins  Review of Systems   Review of Systems  Unable to perform ROS: Mental status change    Physical Exam Updated Vital Signs BP (!) 137/93 (BP Location: Left Arm)    Pulse (!) 109    Temp (!) 103 F (39.4 C) (Oral)    Resp (!) 26    Ht 5' 5" (1.651 m)    Wt 73.2 kg    SpO2 97%    BMI 26.86 kg/m   Physical Exam Vitals and nursing note reviewed.  Constitutional:      Appearance: She is ill-appearing.  HENT:     Head: Normocephalic and atraumatic.     Mouth/Throat:      Mouth: Mucous membranes are moist.  Eyes:     Pupils: Pupils are equal, round, and reactive to light.  Cardiovascular:     Rate and Rhythm: Regular rhythm. Tachycardia present.  Pulmonary:     Effort: Tachypnea present.     Comments: Patient initially on 15L NRB which was turned off, SpO2 initially in the upper 90s but decreased to 90% on RA therefore was placed on 2L via [place] with improvement to mid 90s. She has rales at the right base.   Abdominal:     General: There is no distension.     Palpations: Abdomen is soft.     Tenderness: There is no abdominal tenderness. There is no guarding or rebound.  Musculoskeletal:     Cervical back: Neck supple. No rigidity.     Comments: Trace symmetric edema noted to the lower legs  Neurological:     Mental Status: She is alert.     Comments: Alert. Oriented to person, place, and to month. Answers some questions. CN III-XII Grossly intact.   Psychiatric:        Mood and Affect: Mood normal.    ED Results / Procedures / Treatments   Labs (all labs ordered are listed, but only abnormal results are displayed) Labs Reviewed  LACTIC ACID, PLASMA - Abnormal; Notable for the following components:      Result Value   Lactic Acid, Venous 2.2 (*)    All other components within normal limits  COMPREHENSIVE METABOLIC PANEL - Abnormal; Notable for the following components:   Glucose, Bld 126 (*)    Creatinine, Ser 1.05 (*)    Calcium 8.4 (*)    AST 49 (*)    Alkaline Phosphatase 160 (*)    GFR calc non Af Amer 53 (*)    All other components within normal limits  CBC WITH DIFFERENTIAL/PLATELET - Abnormal; Notable for the following components:   WBC 12.2 (*)    RBC 3.80 (*)    Hemoglobin 11.5 (*)    Neutro Abs 10.0 (*)    All other components within normal limits  URINALYSIS, ROUTINE W REFLEX MICROSCOPIC - Abnormal; Notable for the following components:   Color, Urine AMBER (*)    APPearance TURBID (*)    Protein, ur 30 (*)    Leukocytes,Ua LARGE  (*)  WBC, UA >50 (*)    Bacteria, UA MANY (*)    All other components within normal limits  SARS CORONAVIRUS 2 BY RT PCR (HOSPITAL ORDER, [place] [facility])  CULTURE, BLOOD (ROUTINE X 2)  CULTURE, BLOOD (ROUTINE X 2)  URINE CULTURE  APTT  PROTIME-INR  LACTIC ACID, PLASMA    EKG EKG Interpretation  Date/Time:  Friday February 12 2020 05:31:47 EDT Ventricular Rate:  109 PR Interval:    QRS Duration: 82 QT Interval:  333 QTC Calculation: 449 R Axis:   -17 Text Interpretation: Sinus tachycardia Borderline left axis deviation Low voltage, extremity and precordial leads Consider anterior infarct No significant change since 02/01/2020 Confirmed by Veryl Speak [phone]) on 02/12/2020 5:35:17 AM   Radiology DG Chest Port 1 View  Result Date: 02/12/2020 CLINICAL DATA:  Fever EXAM: PORTABLE CHEST 1 VIEW COMPARISON:  01/20/2020 FINDINGS: Stable mild prominence of lower lung markings. There is no edema, consolidation, effusion, or pneumothorax. Normal heart size and mediastinal contours. Artifact from EKG leads. IMPRESSION: No active disease. Electronically Signed   By: Monte Fantasia M.D.   On: 02/12/2020 06:34    Procedures Procedures (including critical care time)  CRITICAL CARE Performed by: Kennith Maes   Total critical care time: 50 minutes  Critical care time was exclusive of separately billable procedures and treating other patients.  Critical care was necessary to treat or prevent imminent or life-threatening deterioration.  Critical care was time spent personally by me on the following activities: development of treatment plan with patient and/or surrogate as well as nursing, discussions with consultants, evaluation of patient's response to treatment, examination of patient, obtaining history from patient or surrogate, ordering and performing treatments and interventions, ordering and review of laboratory studies, ordering and review of radiographic  studies, pulse oximetry and re-evaluation of patient's condition.  Medications Ordered in ED Medications  vancomycin (VANCOCIN) IVPB 1000 mg/200 mL premix (1,000 mg Intravenous New Bag/Given 02/12/20 0624)  meropenem (MERREM) 1 g in sodium chloride 0.9 % 100 mL IVPB (has no administration in time range)  ceFEPIme (MAXIPIME) 2 g in sodium chloride 0.9 % 100 mL IVPB (0 g Intravenous Stopped 02/12/20 0614)  metroNIDAZOLE (FLAGYL) IVPB 500 mg (500 mg Intravenous New Bag/Given 02/12/20 0547)  acetaminophen (TYLENOL) suppository 650 mg ( Rectal See Alternative 02/12/20 0545)    Or  acetaminophen (TYLENOL) tablet 1,000 mg (1,000 mg Oral Given 02/12/20 0545)  ipratropium-albuterol (DUONEB) 0.5-2.5 (3) MG/3ML nebulizer solution 3 mL (3 mLs Nebulization Given 02/12/20 0629)  sodium chloride 0.9 % bolus 500 mL (500 mLs Intravenous New Bag/Given 02/12/20 5993)    ED Course  I have reviewed the triage vital signs and the nursing notes.  Pertinent labs & imaging results that were available during my care of the patient were reviewed by me and considered in my medical decision making (see chart for details).    MDM Rules/Calculators/A&P                         Patient presents to the ED with concern for fever & AMS that began at midnight this evening.  On arrival on NRB, transitioned to 2L via [place] with good SpO2. She is tachycardic & febrile. Normotensive. Received 1L fluids with EMS PTA. Code sepsis initiated w/ broad spectrum abx, will hold off on additional fluids at this time as patient is not hypotensive w/ BP of 137/93, continue to monitor at this time. I have ordered tylenol  for fever as well.   Additional History obtained from EMS. Previous records obtained and reviewed.  ED Course:  EKG: No significant change.  Lab Tests:  I Ordered, reviewed, and interpreted labs, which included:  CBC: Mild leukocytosis @ 12.2 with left shift. Anemia similar to prior.  CMP: Mild elevation in creatinine compared to  prior.  Alk phos mildly elevated.  No significant electrolyte derangement. PT/INR/APTT: WNL Lactic acid: Elevated @ 2.2 UA: Consistent w/ infection.  COVID: Pending.   Imaging Studies ordered:  I ordered imaging studies which included CXR, I independently visualized and interpreted imaging which showed no active disease per radiology read, upon my read question possible developing infiltrate @ R base.   06:10: RE-EVAL: Patient desaturated to 87% on nasal cannula, titrated up supplemental oxygen and transition back to nonrebreather with maintaining SPO2 greater than 95% on 10 L. Tachycardia ranging 110-140- repeat EKG with sinus tachycardia. Some audible wheezing, will give duoneb and additional 500 cc of fluids.   06:20: Patient's husband is at bedside, provides additional history-he states that when she woke up at midnight she was oriented, went to the bathroom, and when she returned she seemed to be having rigors with chills, he tried to get her warm, she then seemed to break a sweat become flushed with elevated temperature.  Upon EMS arrival she started to become confused.  He relays that she has been breathing more heavily with some wheezing this morning which is new.  He is concerned she has another UTI.  06:55: Transitioned patient back to 4L via [place] with SpO2 @ 98%, HR 110. Patient able to speak in complete sentences, states she is feeling somewhat better.   Patient presentation concerning for sepsis, currently source appears to be UTI, question early R lower lobe infiltrate on CXR however radiology interpretation as negative, most recent urine culture reviewed, fairly resistant, will consult pharmacy for further abx selection. Patient will need admission for sepsis with UTI and her current hypoxia- will obtain CTA of the chest to further assess hypoxia and discuss with hospitalist service. I have updated patient & her husband regarding results & plan of care- they are in agreement.   07:05:  Patient care signed out to Krista Blue PA-C at change of shift pending admission.   This is a shared visit with supervising physician Dr. Stark Jock who has independently evaluated patient & provided guidance in evaluation/management/disposition, in agreement with care   Portions of this note were generated with Dragon dictation software. Dictation errors may occur despite best attempts at proofreading.  Final Clinical Impression(s) / ED Diagnoses Final diagnoses:  Sepsis, due to unspecified organism, unspecified whether acute organ dysfunction present [facility])  Acute cystitis without hematuria  Hypoxia    Rx / DC Orders ED Discharge Orders    None       Amaryllis Dyke, PA-C 02/12/20 0708    Veryl Speak, MD 02/18/20 [phone]

## 2020-02-12 NOTE — Progress Notes (Signed)
Notified bedside nurse of need to draw repeat lactic acid @ 0930.  ?

## 2020-02-12 NOTE — ED Notes (Signed)
Pt desated on 2L [place] to 80%, pt placed back on Non Rebreather per PA Sammy

## 2020-02-12 NOTE — Progress Notes (Signed)
ANTICOAGULATION CONSULT NOTE - Initial Consult  Pharmacy Consult for Enoxaparin Indication: pulmonary embolus  Allergies  Allergen Reactions  . Demerol [Meperidine] Other (See Comments)    Hallucinations  . Percocet [Oxycodone-Acetaminophen] Itching  . Amoxicillin-Pot Clavulanate Diarrhea    Severe pain, headache, intestinal infection  . Penicillins Itching and Rash    Has patient had a PCN reaction causing immediate rash, facial/tongue/throat swelling, SOB or lightheadedness with hypotension:  NO Has patient had a PCN reaction causing severe rash involving mucus membranes or skin necrosis: No Has patient had a PCN reaction that required hospitalization: No Has patient had a PCN reaction occurring within the last 10 years: Yes If all of the above answers are "NO", then may proceed with Cephalosporin use.    Patient Measurements: Height: 5\' 5"  (165.1 cm) Weight: 73.2 kg (161 lb 6.4 oz) IBW/kg (Calculated) : 57 Heparin Dosing Weight:   Vital Signs: Temp: 99.2 F (37.3 C) (06/11 0812) Temp Source: Oral (06/11 0812) BP: 105/56 (06/11 0900) Pulse Rate: 94 (06/11 0900)  Labs: Recent Labs    02/12/20 0520  HGB 11.5*  HCT 37.2  PLT 249  APTT 31  LABPROT 13.7  INR 1.1  CREATININE 1.05*    Estimated Creatinine Clearance: 48.5 mL/min (A) (by C-G formula based on SCr of 1.05 mg/dL (H)).   Medical History: Past Medical History:  Diagnosis Date  . Anxiety   . Back pain   . Gait abnormality 11/14/2016  . Memory difficulty 03/02/2019  . Myelitis due to herpes simplex (HCC)     Medications:  Scheduled:  . DULoxetine  30 mg Oral q morning - 10a  . DULoxetine  60 mg Oral QHS  . enoxaparin (LOVENOX) injection  1 mg/kg Subcutaneous Q12H  . Hair/Skin/Nails/Biotin  1 tablet Oral Daily  . ipratropium-albuterol  3 mL Nebulization Q6H  . omega-3 acid ethyl esters  1 g Oral Daily  . pantoprazole  40 mg Oral QHS  . pregabalin  100 mg Oral QHS    Assessment: Pharmacy is  consulted to dose enoxaparin in 74 yo female admitted with AMS and UTI sepsis. Pt's CT shows acute PE. No anticoagulation or antiplatelet meds on home med list.   Today, 02/12/20   Scr 1.05 mg/dl, CrCl 48 ml/min  Hgb 11.5, plt 249     Goal of Therapy:  Heparin level 0.3-0.7 units/ml Monitor platelets by anticoagulation protocol: Yes   Plan:   Enoxaparin 75 mg SQ q12h   Monitor SCr, CBC  Monitor for signs and symptoms of bleeding   Royetta Asal, PharmD, BCPS 02/12/2020 9:48 AM

## 2020-02-12 NOTE — Progress Notes (Signed)
A consult was received from an ED physician for vancomycin and cefepime per pharmacy dosing.  The patient's profile has been reviewed for ht/wt/allergies/indication/available labs.   A one time order has been placed for vancomycin 1g, cefepime 2g.  Further antibiotics/pharmacy consults should be ordered by admitting physician if indicated.                       Thank you, Peggyann Juba, PharmD, BCPS Pharmacy: [phone] 02/12/2020  5:26 AM

## 2020-02-12 NOTE — ED Triage Notes (Signed)
PT from home with complaints of battling UTI for past 6 weeks. Pt became altered at midnight at home to husband  Tachypenic, fever of 102 at home and came in on Non rebreather. Pt usually A&O x4 and doesn't require oxygen at home.

## 2020-02-12 NOTE — ED Notes (Signed)
Patient transported to CT 

## 2020-02-12 NOTE — Progress Notes (Signed)
PHARMACY NOTE:  ANTIMICROBIAL RENAL DOSAGE ADJUSTMENT  Current antimicrobial regimen includes a mismatch between antimicrobial dosage and estimated renal function.  As per policy approved by the Pharmacy & Therapeutics and Medical Executive Committees, the antimicrobial dosage will be adjusted accordingly.  Current antimicrobial dosage: Meropenem 1gm IV daily  Indication: UTI with Hx ESBL in past 30 days  Renal Function:  Estimated Creatinine Clearance: 48.4 mL/min (A) (by C-G formula based on SCr of 1.05 mg/dL (H)). []      On intermittent HD, scheduled: []      On CRRT    Antimicrobial dosage has been changed to: Meropenem 1gm IV q12h  Additional comments:   Thank you for allowing pharmacy to be a part of this patient's care.  Netta Cedars, [facility] 02/12/2020 10:47 AM

## 2020-02-12 NOTE — Progress Notes (Signed)
  Echocardiogram 2D Echocardiogram has been performed.  Katelyn Lamb 02/12/2020, 9:07 AM

## 2020-02-12 NOTE — Telephone Encounter (Signed)
Patient's husband called office today to inform MD she is currently at [facility]. States last night her condition got worse. Was experiencing; SOB, Headache, Fever. Is concerned she has a sever UTI.  Will forward message to MD. Aundria Rud, [place]

## 2020-02-12 NOTE — ED Notes (Signed)
Admitting provider at bedside.

## 2020-02-12 NOTE — Progress Notes (Signed)
Bilateral lower extremity venous duplex completed. Refer to "CV Proc" under chart review to view preliminary results.  02/12/2020 11:35 AM Kelby Aline., MHA, RVT, RDCS, RDMS

## 2020-02-12 NOTE — H&P (Addendum)
[facility] History and Physical  SAKIRA DAHMER FKC:[id number] DOB: April 22, 1946 DOA: 02/12/2020  Referring physician:  PCP: [name], MD   Chief Complaint: Altered mental status/UTI sepsis  HPI: Katelyn Lamb is a 74 y.o. WF PMHx GAD, Memory difficulty, Herpes zoster with prior Encephalitis, Thrombocytopenia, GERD, anemia, prior DVT, transverse myelitis, recurrent UTI, and sepsis   who presents to the emergency department via EMS for altered mental status and fever. Sxs began at midnight.  Per EMS they received a call from patient's husband whom she lives with at home that the patient was altered and felt hot to the touch.  He described her altered mental status as speaking nonsensically about things like raccoons.  Upon EMS arrival patient was febrile to 102, she was tachycardic, and noted to be hypoxic in the mid 80s on room air improved with application of 15 L nonrebreather.  Patient reports to me that she has had a cough, she has no other specific complaints but does not necessarily consistently answer questions. She denies headache, chest pain, dyspnea, abdominal pain, dysuria, melena, or hematochezia.  Level 5 caveat applies secondary to altered mental status. EMS administered 1L NS en route. Patient's husband is reported to be en route to the hospital. Patient has had both doses of her COVID 19 vaccine.   Chart reviewed for additional history, patient has had multiple recent hospital admissions, most recently discharged 02/07/20 after admission for ambulatory dysfunction, neurology and orthopedic surgery were consulted, initially plan was for SNF placement, however then was prefer that she be discharged home with PT/OT.  Has had issues with recurrent UTIs, recent ESBL UTI.  Husband states that patient has been having frequent recurrent UTIs.  Has been working with Dr. [name], ID to try and identify organism causing these frequent infections.  Stated had been sent home on a  10-day course of antibiotics via PICC line, patient did well until IV antibiotics discontinued and then infection returned.  Not on home O2.  A/O x4, follows all commands.  Positive S OB, negative CP, positive back pain   Review of Systems:  Covid vaccination; positive both doses COVID-19 vaccination  Constitutional:  No weight loss, night sweats, Fevers, chills, fatigue.  HEENT:  No headaches, Difficulty swallowing,Tooth/dental problems,Sore throat,  No sneezing, itching, ear ache, nasal congestion, post nasal drip,  Cardio-vascular:  No chest pain, Orthopnea, PND, swelling in lower extremities, anasarca, dizziness, palpitations  GI:  No heartburn, indigestion, abdominal pain, nausea, vomiting, diarrhea, change in bowel habits, loss of appetite  Resp:  No shortness of breath with exertion or at rest. No excess mucus, no productive cough, No non-productive cough, No coughing up of blood.No change in color of mucus.No wheezing.No chest wall deformity  Skin:  no rash or lesions.  GU:  no dysuria, change in color of urine, no urgency or frequency. No flank pain.  Musculoskeletal:  Positive joint pain or swelling. No decreased range of motion.  Positive back pain.  Psych:  No change in mood or affect. No depression or anxiety. No memory loss.   Past Medical History:  Diagnosis Date  . Anxiety   . Back pain   . Gait abnormality 11/14/2016  . Memory difficulty 03/02/2019  . Myelitis due to herpes simplex [facility])    Past Surgical History:  Procedure Laterality Date  . ABDOMINAL HYSTERECTOMY    . BLADDER REPAIR    . CESAREAN SECTION    . IR KYPHO LUMBAR INC FX REDUCE BONE BX UNI/BIL  CANNULATION INC/IMAGING  04/11/2018  . TUBAL LIGATION     Social History:  reports that she has quit smoking. She has never used smokeless tobacco. She reports that she does not drink alcohol and does not use drugs.  Allergies  Allergen Reactions  . Demerol [Meperidine] Other (See Comments)     Hallucinations  . Percocet [Oxycodone-Acetaminophen] Itching  . Amoxicillin-Pot Clavulanate Diarrhea    Severe pain, headache, intestinal infection  . Penicillins Itching and Rash    Has patient had a PCN reaction causing immediate rash, facial/tongue/throat swelling, SOB or lightheadedness with hypotension:  NO Has patient had a PCN reaction causing severe rash involving mucus membranes or skin necrosis: No Has patient had a PCN reaction that required hospitalization: No Has patient had a PCN reaction occurring within the last 10 years: Yes If all of the above answers are "NO", then may proceed with Cephalosporin use.    Family History  Problem Relation Age of Onset  . Hypertension Mother      Prior to Admission medications   Medication Sig Start Date End Date Taking? Authorizing Provider  acyclovir (ZOVIRAX) 400 MG tablet TAKE 1 TABLET BY MOUTH TWICE A DAY Patient taking differently: Take 400 mg by mouth 2 (two) times daily.  12/29/19  Yes [name], MD  diazepam (VALIUM) 5 MG tablet Take 1 tablet (5 mg total) by mouth every 12 (twelve) hours as needed for anxiety. Patient taking differently: Take 5 mg by mouth in the morning and at bedtime.  02/06/20  Yes Swayze, Ava, DO  diclofenac Sodium (VOLTAREN) 1 % GEL Apply 1 application topically daily as needed (pain).   Yes [provider]  diphenoxylate-atropine (LOMOTIL) 2.5-0.025 MG tablet Take 1 tablet by mouth 4 (four) times daily as needed for diarrhea or loose stools.   Yes [provider]  DULoxetine (CYMBALTA) 30 MG capsule TAKE 1 CAPSULE BY MOUTH EVERY DAY Patient taking differently: Take 30 mg by mouth every morning.  09/07/19  Yes [name], MD  DULoxetine (CYMBALTA) 60 MG capsule Take 1 capsule (60 mg total) by mouth at bedtime. 12/14/19  Yes [name], MD  HYDROcodone-acetaminophen [facility]) 10-325 MG tablet Take 1 tablet by mouth every 6 (six) hours as needed for pain. 03/07/18  Yes [provider]  Multiple Vitamins-Minerals (HAIR/SKIN/NAILS/BIOTIN) TABS Take 1 tablet by mouth daily.   Yes [provider]  nitrofurantoin, macrocrystal-monohydrate, (MACROBID) 100 MG capsule Take 1 capsule (100 mg total) by mouth at bedtime. 02/11/20  Yes [name], MD  omega-3 acid ethyl esters (LOVAZA) 1 g capsule Take 1 g by mouth daily.   Yes [provider]  pantoprazole (PROTONIX) 40 MG tablet Take 1 tablet (40 mg total) by mouth daily. Patient taking differently: Take 40 mg by mouth at bedtime.  10/26/16  Yes Angiulli, Lavon Paganini, PA-C  polyethylene glycol (MIRALAX / GLYCOLAX) packet Take 17 g by mouth daily. Patient taking differently: Take 17 g by mouth daily as needed (constipation).  10/26/16  Yes Angiulli, Lavon Paganini, PA-C  pregabalin (LYRICA) 50 MG capsule Take 100 mg by mouth at bedtime. 11/16/19  Yes [provider]  QUEtiapine (SEROQUEL) 25 MG tablet Take 1 tablet (25 mg total) by mouth at bedtime. Patient taking differently: Take 25 mg by mouth at bedtime as needed (sleep aid).  11/16/19  Yes [name], MD  ipratropium-albuterol (DUONEB) 0.5-2.5 (3) MG/3ML SOLN Take 3 mLs by nebulization every 4 (four) hours as needed (For SOB  and wheezing). Patient not taking: Reported on 12/06/2019 07/25/19 08/24/19  Bonnell Public, MD     Consultants:    Procedures/Significant Events:  6/11 PCXR; negative active disease 6/11 CTA chest PE protocol: 6/11 CTA PE protocol;-very limited examination due to poorly opacified pulmonary arteries and excessive breathing motion artifact. -Right lower lobe pulmonary embolism. -. No thoracic aortic aneurysm or dissection. -No obvious pulmonary lesions, pulmonary edema or pleural effusions. -. Aortic atherosclerosis.   I have personally reviewed and interpreted all radiology studies and my findings are as above.   VENTILATOR SETTINGS: Nasal cannula 6/11 Flow; 4 L/min SPO2 99%   Cultures 6/11 SARS  coronavirus negative 6/11 blood pending 6/11 urine pending   Antimicrobials: Anti-infectives (From admission, onward)   Start     Ordered Stop   02/12/20 0700  meropenem (MERREM) 1 g in sodium chloride 0.9 % 100 mL IVPB     Discontinue     02/12/20 0647     02/12/20 0530  ceFEPIme (MAXIPIME) 2 g in sodium chloride 0.9 % 100 mL IVPB        02/12/20 0518 02/12/20 0614   02/12/20 0530  metroNIDAZOLE (FLAGYL) IVPB 500 mg        02/12/20 0518 02/12/20 0735   02/12/20 0530  vancomycin (VANCOCIN) IVPB 1000 mg/200 mL premix        02/12/20 0518 02/12/20 0736       Devices    LINES / TUBES:      Continuous Infusions: . sodium chloride 100 mL/hr at 02/12/20 0852    Physical Exam: Vitals:   02/12/20 0630 02/12/20 0700 02/12/20 0812 02/12/20 0900  BP: 108/79 94/73  (!) 105/56  Pulse: (!) 112 (!) 110  94  Resp: 20 (!) 30  19  Temp:  99.3 F (37.4 C) 99.2 F (37.3 C)   TempSrc:  Oral Oral   SpO2: 98% 99%  96%  Weight:      Height:        Wt Readings from Last 3 Encounters:  02/12/20 73.2 kg  02/01/20 68 kg  01/24/20 71.8 kg    General: A/O x4, positive acute respiratory distress Eyes: negative scleral hemorrhage, negative anisocoria, negative icterus ENT: Negative Runny nose, negative gingival bleeding, Neck:  Negative scars, masses, torticollis, lymphadenopathy, JVD Lungs: Clear to auscultation bilaterally without wheezes or crackles Cardiovascular: Regular rate and rhythm without murmur gallop or rub normal S1 and S2 Abdomen: negative abdominal pain, nondistended, positive soft, bowel sounds, no rebound, no ascites, no appreciable mass Extremities: No significant cyanosis, clubbing, or edema bilateral lower extremities Skin: Negative rashes, lesions, ulcers Psychiatric:  Negative depression, negative anxiety, negative fatigue, negative mania  Central nervous system:  Cranial nerves II through XII intact, tongue/uvula midline, all extremities muscle strength 5/5,  sensation intact throughout, negative dysarthria, negative expressive aphasia, negative receptive aphasia.        Labs on Admission:  Basic Metabolic Panel: Recent Labs  Lab 02/12/20 0520  NA 138  K 3.7  CL 101  CO2 26  GLUCOSE 126*  BUN 10  CREATININE 1.05*  CALCIUM 8.4*   Liver Function Tests: Recent Labs  Lab 02/12/20 0520  AST 49*  ALT 26  ALKPHOS 160*  BILITOT 0.6  PROT 6.9  ALBUMIN 3.6   No results for input(s): LIPASE, AMYLASE in the last 168 hours. No results for input(s): AMMONIA in the last 168 hours. CBC: Recent Labs  Lab 02/12/20 0520  WBC 12.2*  NEUTROABS 10.0*  HGB 11.5*  HCT 37.2  MCV 97.9  PLT 249   Cardiac Enzymes: No results for input(s): CKTOTAL, CKMB, CKMBINDEX, TROPONINI in the last 168 hours.  BNP (last 3 results) No results for input(s): BNP in the last 8760 hours.  ProBNP (last 3 results) No results for input(s): PROBNP in the last 8760 hours.  CBG: No results for input(s): GLUCAP in the last 168 hours.  Radiological Exams on Admission: CT Angio Chest PE W/Cm &/Or Wo Cm  Result Date: 02/12/2020 CLINICAL DATA:  Altered mental status.  Fever. EXAM: CT ANGIOGRAPHY CHEST WITH CONTRAST TECHNIQUE: Multidetector CT imaging of the chest was performed using the standard protocol during bolus administration of intravenous contrast. Multiplanar CT image reconstructions and MIPs were obtained to evaluate the vascular anatomy. CONTRAST:  42m OMNIPAQUE IOHEXOL 350 MG/ML SOLN COMPARISON:  Chest x-ray 02/11/2010 and prior chest CT 07/23/2019 FINDINGS: Cardiovascular: The heart is normal in size. No pericardial effusion. Mild tortuosity and ectasia of the thoracic aorta but no aneurysm or dissection. No significant atherosclerotic calcifications. A few calcifications are noted at the aortic valve. The aortic branch vessels are patent. Study is quite limited by poorly opacified pulmonary arteries and excessive breathing motion artifact but there are  definite filling defects in right lower lobe arterial branches consistent with pulmonary embolism. Mediastinum/Nodes: No mediastinal or hilar mass or lymphadenopathy. The esophagus is grossly normal. Lungs/Pleura: Limited examination due to excessive breathing motion artifact. I do not see any obvious pulmonary lesions. No pulmonary edema or pleural effusions. Upper Abdomen: No significant upper abdominal findings. Musculoskeletal: No breast masses, supraclavicular or axillary lymphadenopathy. The bony thorax is intact. Review of the MIP images confirms the above findings. IMPRESSION: 1. Very limited examination due to poorly opacified pulmonary arteries and excessive breathing motion artifact. 2. Right lower lobe pulmonary embolism. 3. No thoracic aortic aneurysm or dissection. 4. No obvious pulmonary lesions, pulmonary edema or pleural effusions. 5. Aortic atherosclerosis. Aortic Atherosclerosis (ICD10-I70.0). Electronically Signed   By: PMarijo SanesM.D.   On: 02/12/2020 08:19   DG Chest Port 1 View  Result Date: 02/12/2020 CLINICAL DATA:  Fever EXAM: PORTABLE CHEST 1 VIEW COMPARISON:  01/20/2020 FINDINGS: Stable mild prominence of lower lung markings. There is no edema, consolidation, effusion, or pneumothorax. Normal heart size and mediastinal contours. Artifact from EKG leads. IMPRESSION: No active disease. Electronically Signed   By: JMonte FantasiaM.D.   On: 02/12/2020 06:34    EKG: Independently reviewed.  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 CyCarlyle BasquesID who recommended  Macrobid x30 days for chronic suppression.  Follow-up with Dr. Amalia Hailey urologist in 2 to 3 weeks. -We will ensure ID instructions follow once patient stable for discharge.  Neurogenic bladder -Patient's MAR does not contain medication for neurogenic bladder -Bladder scan TID; checking for complete emptying of bladder -If patient having significant urine retention may be contributing to her frequent UTI we will add medication.  Acute respiratory failure with hypoxia -Patient does not use home O2 -Most likely secondary to acute RIGHT PE -Titrate O2 to maintain SPO2> 92% -Incentive spirometer -Flutter valve -DuoNeb QID  Acute RIGHT lower lobe PE -CTA PE protocol consistent with acute PE see results above -Full dose Lovenox per pharmacy -Obtain lower extremity Doppler; R/O DVT  Incomplete paraplegia -Stable  Herpes zoster without complication -Lyrica 503 mg nightly  Generalized anxiety disorder -Cymbalta 30 mg every morning/60 mg nightly Reactive depression  Essential HTN -Patient currently borderline hypotensive -Hold home narcotics and benzodiazepine until pressures stabilized.  Goals of care -6/11 PT/OT consult; evaluate patient for CIR vs SNF vs home health   Code Status: Full (DVT Prophylaxis: Lovenox full dose Family Communication: 6/11 husband at bedside for discussion of plan of care Status is:  Inpatient    Dispo: The patient is from: Home              Anticipated d/c is to: Home vs SNF              Anticipated d/c date is: 6/16              Patient currently unstable     Data Reviewed: Care during the described time interval was provided by me .  I have reviewed this patient's available data, including medical history, events of note, physical examination, and all test results as part of my evaluation.   The patient is critically ill with multiple organ systems failure and requires high complexity decision making for assessment and support, frequent evaluation and titration of therapies, application of advanced monitoring technologies and extensive interpretation of multiple databases. Critical Care Time devoted to patient care services described in this note  Time spent: 74 minutes   Ensley Blas, [facility] Pager [phone]

## 2020-02-12 NOTE — Progress Notes (Signed)
Notified bedside nurse of need to draw repeat lactic acid. 

## 2020-02-12 NOTE — Progress Notes (Addendum)
Per orders, post void bladder scan completed. Pt with 358ml remaining. Dr. Sherral Hammers made aware. Per Dr. Sherral Hammers, RN to insert foley catheter if the next scan overnight shows post void residual >247ml.

## 2020-02-12 NOTE — ED Notes (Signed)
Attempted report x1, no answer on receiving unit.

## 2020-02-12 NOTE — ED Provider Notes (Signed)
Received patient as a handoff at shift change from [facility], PA-C.  HPI as obtained by handoff provider: [name] is a 74 y.o. female with a history of herpes zoster with prior encephalitis, thrombocytopenia, GERD, anemia, prior DVT, transverse myelitis, recurrent UTI, and sepsis who presents to the emergency department via EMS for altered mental status and fever. Sxs began at midnight.  Per EMS they received a call from patient's husband whom she lives with at home that the patient was altered and felt hot to the touch.  He described her altered mental status as speaking nonsensically about things like raccoons.  Upon EMS arrival patient was febrile to 102, she was tachycardic, and noted to be hypoxic in the mid 80s on room air improved with application of 15 L nonrebreather.  Patient reports to me that she has had a cough, she has no other specific complaints but does not necessarily consistently answer questions. She denies headache, chest pain, dyspnea, abdominal pain, dysuria, melena, or hematochezia.  Level 5 caveat applies secondary to altered mental status. EMS administered 1L NS en route. Patient's husband is reported to be en route to the hospital. Patient has had both doses of her COVID 19 vaccine.   Chart reviewed for additional history, patient has had multiple recent hospital admissions, most recently discharged 02/07/20 after admission for ambulatory dysfunction, neurology and orthopedic surgery were consulted, initially plan was for SNF placement, however then was prefer that she be discharged home with PT/OT.  Has had issues with recurrent UTIs, recent ESBL UTI.   Physical Exam  BP 94/73 (BP Location: Right Arm)   Pulse (!) 110   Temp 99.3 F (37.4 C) (Oral)   Resp (!) 30   Ht 5\' 5"  (1.651 m)   Wt 73.2 kg   SpO2 99%   BMI 26.86 kg/m   Physical Exam Vitals and nursing note reviewed. Exam conducted with a chaperone present.  Constitutional:      Appearance: She  is ill-appearing.  HENT:     Head: Normocephalic and atraumatic.  Eyes:     General: No scleral icterus.    Conjunctiva/sclera: Conjunctivae normal.  Cardiovascular:     Rate and Rhythm: Regular rhythm. Tachycardia present.     Pulses: Normal pulses.     Heart sounds: Normal heart sounds.  Pulmonary:     Comments: Tachypneic. Skin:    General: Skin is dry.     Capillary Refill: Capillary refill takes less than 2 seconds.  Neurological:     Mental Status: She is alert and oriented to person, place, and time.     GCS: GCS eye subscore is 4. GCS verbal subscore is 5. GCS motor subscore is 6.  Psychiatric:        Mood and Affect: Mood normal.        Behavior: Behavior normal.        Thought Content: Thought content normal.      ED Course/Procedures   Clinical Course as of Feb 11 730  Fri Feb 12, 2020  0728 Spoke with Dr. Sherral Hammers who will see and admit patient.   [GG]    Clinical Course User Index [GG] Corena Herter, PA-C    Procedures Results for orders placed or performed during the hospital encounter of 02/12/20  SARS Coronavirus 2 by RT PCR (hospital order, performed in [facility] hospital lab) Nasopharyngeal Nasopharyngeal Swab   Specimen: Nasopharyngeal Swab  Result Value Ref Range   SARS Coronavirus 2 NEGATIVE NEGATIVE  Lactic acid, plasma  Result Value Ref Range   Lactic Acid, Venous 2.2 (HH) 0.5 - 1.9 mmol/L  Comprehensive metabolic panel  Result Value Ref Range   Sodium 138 135 - 145 mmol/L   Potassium 3.7 3.5 - 5.1 mmol/L   Chloride 101 98 - 111 mmol/L   CO2 26 22 - 32 mmol/L   Glucose, Bld 126 (H) 70 - 99 mg/dL   BUN 10 8 - 23 mg/dL   Creatinine, Ser 1.05 (H) 0.44 - 1.00 mg/dL   Calcium 8.4 (L) 8.9 - 10.3 mg/dL   Total Protein 6.9 6.5 - 8.1 g/dL   Albumin 3.6 3.5 - 5.0 g/dL   AST 49 (H) 15 - 41 U/L   ALT 26 0 - 44 U/L   Alkaline Phosphatase 160 (H) 38 - 126 U/L   Total Bilirubin 0.6 0.3 - 1.2 mg/dL   GFR calc non Af Amer 53 (L) >60 mL/min   GFR  calc Af Amer >60 >60 mL/min   Anion gap 11 5 - 15  CBC WITH DIFFERENTIAL  Result Value Ref Range   WBC 12.2 (H) 4.0 - 10.5 K/uL   RBC 3.80 (L) 3.87 - 5.11 MIL/uL   Hemoglobin 11.5 (L) 12.0 - 15.0 g/dL   HCT 37.2 36 - 46 %   MCV 97.9 80.0 - 100.0 fL   MCH 30.3 26.0 - 34.0 pg   MCHC 30.9 30.0 - 36.0 g/dL   RDW 13.1 11.5 - 15.5 %   Platelets 249 150 - 400 K/uL   nRBC 0.0 0.0 - 0.2 %   Neutrophils Relative % 83 %   Neutro Abs 10.0 (H) 1.7 - 7.7 K/uL   Lymphocytes Relative 11 %   Lymphs Abs 1.4 0.7 - 4.0 K/uL   Monocytes Relative 4 %   Monocytes Absolute 0.5 0 - 1 K/uL   Eosinophils Relative 2 %   Eosinophils Absolute 0.2 0 - 0 K/uL   Basophils Relative 0 %   Basophils Absolute 0.0 0 - 0 K/uL   Immature Granulocytes 0 %   Abs Immature Granulocytes 0.05 0.00 - 0.07 K/uL  APTT  Result Value Ref Range   aPTT 31 24 - 36 seconds  Protime-INR  Result Value Ref Range   Prothrombin Time 13.7 11.4 - 15.2 seconds   INR 1.1 0.8 - 1.2  Urinalysis, Routine w reflex microscopic  Result Value Ref Range   Color, Urine AMBER (A) YELLOW   APPearance TURBID (A) CLEAR   Specific Gravity, Urine 1.010 1.005 - 1.030   pH 7.0 5.0 - 8.0   Glucose, UA NEGATIVE NEGATIVE mg/dL   Hgb urine dipstick NEGATIVE NEGATIVE   Bilirubin Urine NEGATIVE NEGATIVE   Ketones, ur NEGATIVE NEGATIVE mg/dL   Protein, ur 30 (A) NEGATIVE mg/dL   Nitrite NEGATIVE NEGATIVE   Leukocytes,Ua LARGE (A) NEGATIVE   RBC / HPF 11-20 0 - 5 RBC/hpf   WBC, UA >50 (H) 0 - 5 WBC/hpf   Bacteria, UA MANY (A) NONE SEEN   WBC Clumps PRESENT    Mucus PRESENT    MR BRAIN WO CONTRAST  Result Date: 02/02/2020 CLINICAL DATA:  Encephalopathy EXAM: MRI HEAD WITHOUT CONTRAST TECHNIQUE: Multiplanar, multiecho pulse sequences of the brain and surrounding structures were obtained without intravenous contrast. COMPARISON:  03/30/2019 FINDINGS: BRAIN: No acute infarct, acute hemorrhage or extra-axial collection. Early confluent hyperintense  T2-weighted signal of the periventricular and deep white matter, most commonly due to chronic ischemic microangiopathy. There is generalized atrophy without  lobar predilection. No chronic microhemorrhage. Normal midline structures. VASCULAR: Major flow voids are preserved. SKULL AND UPPER CERVICAL SPINE: Normal calvarium and skull base. Visualized upper cervical spine and soft tissues are normal. SINUSES/ORBITS: No paranasal sinus fluid levels or advanced mucosal thickening. No mastoid or middle ear effusion. Normal orbits. IMPRESSION: 1. No acute intracranial abnormality. 2. Moderate chronic small vessel disease. Electronically Signed   By: Ulyses Jarred M.D.   On: 02/02/2020 00:46   MR Cervical Spine W or Wo Contrast  Result Date: 02/02/2020 CLINICAL DATA:  History of transverse myelitis. Progressive lower extremity weakness. EXAM: MRI CERVICAL SPINE WITHOUT AND WITH CONTRAST TECHNIQUE: Multiplanar and multiecho pulse sequences of the cervical spine, to include the craniocervical junction and cervicothoracic junction, were obtained without and with intravenous contrast. CONTRAST:  3.66mL GADAVIST GADOBUTROL 1 MMOL/ML IV SOLN COMPARISON:  Cervical spine MRI 03/14/2018 FINDINGS: Alignment: Reversal of normal cervical lordosis may be positional or due to muscle spasm. Vertebrae: T2 M angioma.  No acute fracture. Cord: Hyperintense T2-weighted signal within the dorsal spinal cord at the C3-4 levels. This is unchanged from the prior study. Posterior Fossa, vertebral arteries, paraspinal tissues: Negative. Disc levels: C1-2: Unremarkable. C2-3: Normal disc space and facet joints. There is no spinal canal stenosis. No neural foraminal stenosis. C3-4: Small disc osteophyte complex, unchanged. There is no spinal canal stenosis. No neural foraminal stenosis. C4-5: Unchanged intermediate disc osteophyte complex. Unchanged mild spinal canal stenosis. Unchanged moderate bilateral neural foraminal stenosis. C5-6: Unchanged  central disc protrusion with endplate spurring indenting the ventral aspect of the spinal cord. Severe spinal canal stenosis. Unchanged severe bilateral neural foraminal stenosis. C6-7: Intermediate disc bulge. Unchanged mild spinal canal stenosis. Unchanged moderate neural foraminal stenosis. C7-T1: Normal disc space and facet joints. There is no spinal canal stenosis. No neural foraminal stenosis. IMPRESSION: 1. Unchanged examination with severe spinal canal stenosis at C5-6 with indentation of the ventral aspect of the spinal cord. 2. Hyperintense T2-weighted signal within the dorsal spinal cord at the C3-4 levels, unchanged from the prior study and likely a sequela of transverse myelitis. 3. Unchanged mild spinal canal stenosis at C4-5 and C6-7. 4. Unchanged severe bilateral C5-6 and moderate bilateral C4-5 and C6-7 neural foraminal stenosis. Electronically Signed   By: Ulyses Jarred M.D.   On: 02/02/2020 01:37   MR THORACIC SPINE W WO CONTRAST  Result Date: 02/04/2020 CLINICAL DATA:  Progressive difficulty walking. History of HSV encephalomyelitis and transverse myelitis. EXAM: MRI THORACIC WITHOUT AND WITH CONTRAST TECHNIQUE: Multiplanar and multiecho pulse sequences of the thoracic spine were obtained without and with intravenous contrast. CONTRAST:  28mL GADAVIST GADOBUTROL 1 MMOL/ML IV SOLN COMPARISON:  CTA chest dated July 23, 2019. MRI thoracic spine dated October 03, 2016. FINDINGS: Alignment:  Physiologic. Vertebrae: No fracture, evidence of discitis, or suspicious bone lesion. Unchanged atypical hemangioma in the T2 vertebral body. Partially visualized chronic L2 compression deformity status post cement augmentation. Cord: There is some residual T2 signal abnormality within the thoracic cord at T2, T6, and T7-T8, decreased in intensity since the prior study from 2018, and without associated enhancement. No new cord lesion. Paraspinal and other soft tissues: Negative. Disc levels: Unchanged  scattered small central and paracentral disc protrusions. No spinal canal or neuroforaminal stenosis at any level. IMPRESSION: 1. No acute abnormality. 2. Decreased intensity of multifocal T2 signal abnormality within the thoracic cord without associated enhancement, consistent with sequelae of prior myelitis. No new cord lesion. Electronically Signed   By: Orville Govern.D.  On: 02/04/2020 14:51   MR Lumbar Spine W Wo Contrast  Result Date: 02/02/2020 CLINICAL DATA:  Lower extremity weakness and low back pain EXAM: MRI LUMBAR SPINE WITHOUT AND WITH CONTRAST TECHNIQUE: Multiplanar and multiecho pulse sequences of the lumbar spine were obtained without and with intravenous contrast. CONTRAST:  3.27mL GADAVIST GADOBUTROL 1 MMOL/ML IV SOLN COMPARISON:  02/26/2008 FINDINGS: Segmentation:  Standard Alignment: Grade 1 anterolisthesis at L4-5 due to severe facet arthrosis Vertebrae: Sclerotic areas within the L2 and L5 vertebrae compatible prior vertebral augmentation. No acute fracture. No discitis-osteomyelitis. Conus medullaris and cauda equina: Conus extends to the L1 level. Conus and cauda equina appear normal. Paraspinal and other soft tissues: Negative Disc levels: T12-L1: Small disc bulge.  No stenosis. L1-L2: Partial involution of left subarticular disc protrusion. There is no spinal canal stenosis. No neural foraminal stenosis. L2-L3: Normal disc space and facet joints. There is no spinal canal stenosis. No neural foraminal stenosis. L3-L4: Moderate facet hypertrophy with mild disc bulge. Left lateral recess narrowing without central spinal canal stenosis. No neural foraminal stenosis. L4-L5: Severe facet hypertrophy with intermediate sized disc uncovering. Mild spinal canal stenosis. Moderate bilateral neural foraminal stenosis. L5-S1: Left asymmetric disc bulge. There is no spinal canal stenosis. No neural foraminal stenosis. Visualized sacrum: Normal. IMPRESSION: 1. Mild spinal canal stenosis and  moderate bilateral neural foraminal stenosis at L4-L5 secondary to grade 1 anterolisthesis caused by severe facet arthrosis. 2. Left lateral recess narrowing at L3-L4. Correlate for L4 radiculopathy. 3. Partial involution of left subarticular disc protrusion at L1-L2. No associated stenosis. Electronically Signed   By: Ulyses Jarred M.D.   On: 02/02/2020 01:23   US RENAL  Result Date: 01/20/2020 CLINICAL DATA:  74 year old female with acute renal injury. EXAM: RENAL / URINARY TRACT ULTRASOUND COMPLETE COMPARISON:  CT Abdomen and Pelvis 07/23/2019. FINDINGS: Right Kidney: Renal measurements: 8.6 x 5.0 x 4.3 cm = volume: 96 mild mL . Echogenicity within normal limits. Thinning of the cortex (image 6). No mass or hydronephrosis visualized. Left Kidney: Renal measurements: 8.4 x 5.0 x 4.3 cm = volume: 95 mL. Echogenicity within normal limits. Thinning of the cortex (image 19). No mass or hydronephrosis visualized. Bladder: Appears normal for degree of bladder distention. Other: None. IMPRESSION: No acute renal finding.  Some renal cortical atrophy is evident. Electronically Signed   By: Genevie Ann M.D.   On: 01/20/2020 15:14   DG Chest Port 1 View  Result Date: 02/12/2020 CLINICAL DATA:  Fever EXAM: PORTABLE CHEST 1 VIEW COMPARISON:  01/20/2020 FINDINGS: Stable mild prominence of lower lung markings. There is no edema, consolidation, effusion, or pneumothorax. Normal heart size and mediastinal contours. Artifact from EKG leads. IMPRESSION: No active disease. Electronically Signed   By: Monte Fantasia M.D.   On: 02/12/2020 06:34   DG Chest Port 1 View  Result Date: 01/20/2020 CLINICAL DATA:  Shortness of breath. Weakness, fever and somnolence. On antibiotics for urinary tract infection. EXAM: PORTABLE CHEST 1 VIEW COMPARISON:  Radiographs 12/06/2019 and 07/13/2019. CT 07/23/2019. FINDINGS: 1126 hours. The heart size and mediastinal contours are stable. There are lower lung volumes with increased streaky  bibasilar pulmonary opacities, radiographically most consistent with atelectasis. No consolidation, edema, pleural effusion or pneumothorax. The bones appear unchanged. Telemetry leads overlie the chest. IMPRESSION: Lower lung volumes with increased streaky bibasilar pulmonary opacities, radiographically consistent with atelectasis. No consolidation or pleural effusion. Electronically Signed   By: Richardean Sale M.D.   On: 01/20/2020 11:38   DG HIPS BILAT WITH  PELVIS 3-4 VIEWS  Result Date: 02/04/2020 CLINICAL DATA:  Pelvic fracture. EXAM: DG HIP (WITH OR WITHOUT PELVIS) 3-4V BILAT COMPARISON:  December 06, 2019. FINDINGS: There is no evidence of hip fracture or dislocation. However, there appears to be a nondisplaced acute to subacute fracture involving the left side of the pubic symphysis which extends into the left inferior pubic ramus. IMPRESSION: Nondisplaced acute to subacute fracture involving the left side of the pubic symphysis which extends into the left inferior pubic ramus. Hips are unremarkable. Electronically Signed   By: Marijo Conception M.D.   On: 02/04/2020 12:00   [place] EKG SITE RITE  Result Date: 01/23/2020 If [facility] image not attached, placement could not be confirmed due to current cardiac rhythm.    MDM   In short, patient presents to the ED febrile, tachycardic, and tachypneic with evidence of urosepsis.  She has a history of recurrent ESBL UTI.  Handoff provider had already placed consult with hospitalist, will speak with them shortly.  Laboratory work-up notable for elevated lactic acid 2.2, leukocytosis to 12.2, and UA suggestive of infection.  Patient had received both of her COVID-19 vaccinations and is COVID-19 negative here in the ED.  DG chest demonstrates no obvious cause for patient's hypoxia and tachypnea, CTA PE study ordered and pending.  Patient is currently oxygenating at 90% SPO2 on 2 L supplemental O2 via [place].  She has been provided 1 g Tylenol and had been started  on cefepime, vancomycin, and metronidazole.  She has only been given 0.5 L at time of handoff, so while she is trending towards hypotension we have room to give.    Spoke with Dr. Sherral Hammers who will see and admit patient.    Corena Herter, PA-C 02/12/20 0731    Lucrezia Starch, MD 02/16/20 [phone]

## 2020-02-12 NOTE — Progress Notes (Signed)
Notified bedside nurse of need to draw repeat lactic acid ([facility] 1231).

## 2020-02-12 NOTE — Consult Note (Signed)
NAME:  Katelyn Lamb, MRN:  [id number], DOB:  12-08-1945, LOS: 0 ADMISSION DATE:  02/12/2020, CONSULTATION DATE: 6/11 REFERRING MD:  Sherral Hammers, CHIEF COMPLAINT:  Septic shock    Brief History   74 year old white female with multiple medical problems.  Most relevantly history of ESBL urinary tract infections.  Just discharged on 6/6 after being treated for this.  Readmitted 6/11 with working diagnosis of septic shock in the setting of urinary tract infection, critical care asked to evaluate when she remained hypotensive in spite of volume resuscitation efforts  History of present illness    74 year old female with history as described below who was in her usual state of health up until 7/11 approximately midnight when her husband found her to be confused having hallucinations.  She had a temperature as high as 102 she was tachycardic, warm to touch.  In the ER she was also found to be hypoxic with initial pulse oximetry in the mid 80s.  She was also hypotensive and received 1 L saline in route to the emergency room. She had recently been discharged on 6/6 after a hospital admission where she was treated for an ESBL urinary tract infection.  She was stated go home on a 10-day course of antibiotics via PICC line, apparently she did well until antibiotics were discontinued.  She was to be admitted to the internal medicine service to the stepdown unit, she was treated with an additional liter and a half of saline, impaired Antibiotics after cultures were sent.  Critical care was asked to evaluate later that morning as the patient continued to have hypotensive being spite of volume resuscitation efforts and was being started on norepinephrine infusion Past Medical History  ESBL UTI, memory deficit, prior herpes w/ encephalitis, thrombocytopenia, GERD, anemia, transverse myelitis, prior sepsis, DVT, gait disturbance,   Significant Hospital Events   6/11 admitted  Consults:  Critical care consulted  6/11  Procedures:    Significant Diagnostic Tests:  CT of chest 6/11: Right lower lobe pulmonary embolism , No obvious pulmonary lesions pulmonary edema or pleural effusion.  Micro Data:  5/19: Klebsiella, greater than 100,000 colony count of ESBL producer (prior admit) 6/11 blood culture 6/11 urine culture 6/11 SARS coronavirus PCR: Negative.   Antimicrobials:  Meropenem 6/11>>> Vancomycin 6/10>>> Metronidazole 6/10>>>stopped  Interim history/subjective:  Still slow to respond now on pressors   Objective   Blood pressure (Abnormal) 73/46, pulse 83, temperature 98.7 F (37.1 C), temperature source Oral, resp. rate 15, height 5\' 5"  (1.651 m), weight 72.7 kg, SpO2 97 %.        Intake/Output Summary (Last 24 hours) at 02/12/2020 1215 Last data filed at 02/12/2020 1100 Gross per 24 hour  Intake 2641.09 ml  Output no documentation  Net 2641.09 ml   Filed Weights   02/12/20 0511 02/12/20 0944  Weight: 73.2 kg 72.7 kg    Examination: General: 74 year old white female remains lethargic, currently lying in bed not in acute distress HENT: Normocephalic atraumatic mucous membranes moist no JVD Lungs: Clear to auscultation without accessory use Cardiovascular: Bradycardic regular rhythm without murmur rub or gallop Abdomen: Soft nontender no organomegaly positive bowel sounds Extremities: Warm palpable pulses brisk cap refill Neuro: Slow to respond, mumbles but not able to understand much of her spoken word.  Generalized weakness GU: Concentrated yellow urine  Resolved Hospital Problem list     Assessment & Plan:  Septic shock, presumptively urinary tract source w/ lactic acidosis  -Historically symptoms resumed not long after cessation of  antibiotics.  Has history of recurrent urinary tract infection and most recently ESBL producing organism Plan Continue maintenance IV fluids, she is completing 1/3 L of crystalloid, this will satisfy 30 mL/kg predicted body weight fluid  challenge Continuing broad-spectrum antibiotics, agree with meropenem, will see what her culture show, unlikely we need to continue vancomycin but will keep for now  continue low-dose Norepinephrine, titrate for mean arterial pressure greater than 65.  If requires greater then 10 mcg she will need central access Follow-up pending cortisol Serial lactates Will check renal ultrasound , Ensure no evidence of pyelonephritis obstructive uropathy  Acute metabolic encephalopathy -Most likely in the setting of sepsis.  She does have a complicated medical history including history of herpes encephalitis, prior transverse myelitis and gait disturbance, also review of medication list shows Home medications including Seroquel, Lyrica, Cymbalta, Norco and as needed Valium Arterial blood gas obtained, acidosis not a contributing factor Plan Cont supportive care rx infection Eventually will need to add back cymbalta but holding all sedating meds for now   Acute Pulmonary Emboli -this reflects her second venous thromboembolic event  Plan Cont LMWH  Eventually transition to oral therapy; she will likely need life long AC given her limited mobility and second VTE event   Acute hypoxic respiratory respiratory failure  likeyl multifactorial: hypoperfusion, small PE, atx  -improving Plan Pulse ox  Supplemental oxygen     Mild AKI in setting of shock Plan Cont IVFs  Renal dose meds Strict I&O Am chem   Anemia without evidence of bleeding Plan Trend CBC   Best pratoctice:  Diet: N.p.o. Pain/Anxiety/Delirium protocol (if indicated): Holding VAP protocol (if indicated): Not applicable DVT prophylaxis: Therapeutic low molecular weight heparin GI prophylaxis N/A Glucose control: N/A Mobility: Bedrest Code Status: Full code Family Communication: Updated Disposition: Agree with ICU transfer Labs   CBC: Recent Labs  Lab 02/12/20 0520  WBC 12.2*  NEUTROABS 10.0*  HGB 11.5*  HCT 37.2    MCV 97.9  PLT 315    Basic Metabolic Panel: Recent Labs  Lab 02/12/20 0520  NA 138  K 3.7  CL 101  CO2 26  GLUCOSE 126*  BUN 10  CREATININE 1.05*  CALCIUM 8.4*   GFR: Estimated Creatinine Clearance: 48.4 mL/min (A) (by C-G formula based on SCr of 1.05 mg/dL (H)). Recent Labs  Lab 02/12/20 0520 02/12/20 0732 02/12/20 1022  WBC 12.2*  --   --   LATICACIDVEN 2.2* 2.7* 1.6    Liver Function Tests: Recent Labs  Lab 02/12/20 0520  AST 49*  ALT 26  ALKPHOS 160*  BILITOT 0.6  PROT 6.9  ALBUMIN 3.6   No results for input(s): LIPASE, AMYLASE in the last 168 hours. No results for input(s): AMMONIA in the last 168 hours.  ABG    Component Value Date/Time   HCO3 25.0 01/20/2020 1104   TCO2 26 01/20/2020 1104   O2SAT 98.0 01/20/2020 1104     Coagulation Profile: Recent Labs  Lab 02/12/20 0520  INR 1.1    Cardiac Enzymes: No results for input(s): CKTOTAL, CKMB, CKMBINDEX, TROPONINI in the last 168 hours.  HbA1C: No results found for: HGBA1C  CBG: No results for input(s): GLUCAP in the last 168 hours.  Review of Systems:   Not able  Past Medical History  She,  has a past medical history of Anxiety, Back pain, Gait abnormality (11/14/2016), Memory difficulty (03/02/2019), and Myelitis due to herpes simplex ([place]).   Surgical History    Past Surgical History:  Procedure Laterality Date  . ABDOMINAL HYSTERECTOMY    . BLADDER REPAIR    . CESAREAN SECTION    . IR KYPHO LUMBAR INC FX REDUCE BONE BX UNI/BIL CANNULATION INC/IMAGING  04/11/2018  . TUBAL LIGATION       Social History   reports that she has quit smoking. She has never used smokeless tobacco. She reports that she does not drink alcohol and does not use drugs.   Family History   Her family history includes Hypertension in her mother.   Allergies Allergies  Allergen Reactions  . Demerol [Meperidine] Other (See Comments)    Hallucinations  . Percocet [Oxycodone-Acetaminophen] Itching  .  Amoxicillin-Pot Clavulanate Diarrhea    Severe pain, headache, intestinal infection  . Penicillins Itching and Rash    Has patient had a PCN reaction causing immediate rash, facial/tongue/throat swelling, SOB or lightheadedness with hypotension:  NO Has patient had a PCN reaction causing severe rash involving mucus membranes or skin necrosis: No Has patient had a PCN reaction that required hospitalization: No Has patient had a PCN reaction occurring within the last 10 years: Yes If all of the above answers are "NO", then may proceed with Cephalosporin use.     Home Medications  Prior to Admission medications   Medication Sig Start Date End Date Taking? Authorizing Provider  acyclovir (ZOVIRAX) 400 MG tablet TAKE 1 TABLET BY MOUTH TWICE A DAY Patient taking differently: Take 400 mg by mouth 2 (two) times daily.  12/29/19  Yes [name], MD  diazepam (VALIUM) 5 MG tablet Take 1 tablet (5 mg total) by mouth every 12 (twelve) hours as needed for anxiety. Patient taking differently: Take 5 mg by mouth in the morning and at bedtime.  02/06/20  Yes Swayze, Ava, DO  diclofenac Sodium (VOLTAREN) 1 % GEL Apply 1 application topically daily as needed (pain).   Yes [provider]  diphenoxylate-atropine (LOMOTIL) 2.5-0.025 MG tablet Take 1 tablet by mouth 4 (four) times daily as needed for diarrhea or loose stools.   Yes [provider]  DULoxetine (CYMBALTA) 30 MG capsule TAKE 1 CAPSULE BY MOUTH EVERY DAY Patient taking differently: Take 30 mg by mouth every morning.  09/07/19  Yes [name], MD  DULoxetine (CYMBALTA) 60 MG capsule Take 1 capsule (60 mg total) by mouth at bedtime. 12/14/19  Yes [name], MD  HYDROcodone-acetaminophen [facility]) 10-325 MG tablet Take 1 tablet by mouth every 6 (six) hours as needed for pain. 03/07/18  Yes [provider]  Multiple Vitamins-Minerals (HAIR/SKIN/NAILS/BIOTIN) TABS Take 1 tablet by mouth daily.   Yes [provider]  nitrofurantoin, macrocrystal-monohydrate, (MACROBID) 100 MG capsule Take 1 capsule (100 mg total) by mouth at bedtime. 02/11/20  Yes [name], MD  omega-3 acid ethyl esters (LOVAZA) 1 g capsule Take 1 g by mouth daily.   Yes [provider]  pantoprazole (PROTONIX) 40 MG tablet Take 1 tablet (40 mg total) by mouth daily. Patient taking differently: Take 40 mg by mouth at bedtime.  10/26/16  Yes Angiulli, Lavon Paganini, PA-C  polyethylene glycol (MIRALAX / GLYCOLAX) packet Take 17 g by mouth daily. Patient taking differently: Take 17 g by mouth daily as needed (constipation).  10/26/16  Yes Angiulli, Lavon Paganini, PA-C  pregabalin (LYRICA) 50 MG capsule Take 100 mg by mouth at bedtime. 11/16/19  Yes [provider]  QUEtiapine (SEROQUEL) 25 MG tablet Take 1 tablet (25 mg total) by mouth at bedtime. Patient taking differently: Take  25 mg by mouth at bedtime as needed (sleep aid).  11/16/19  Yes [name], MD  ipratropium-albuterol (DUONEB) 0.5-2.5 (3) MG/3ML SOLN Take 3 mLs by nebulization every 4 (four) hours as needed (For SOB and wheezing). Patient not taking: Reported on 12/06/2019 07/25/19 08/24/19  Bonnell Public, MD     Critical care time: [place] ACNP-BC [place] Pager # [phone] OR # [phone] if no answer

## 2020-02-13 ENCOUNTER — Inpatient Hospital Stay (HOSPITAL_COMMUNITY): Payer: PPO

## 2020-02-13 DIAGNOSIS — I2699 Other pulmonary embolism without acute cor pulmonale: Secondary | ICD-10-CM

## 2020-02-13 LAB — CBC WITH DIFFERENTIAL/PLATELET
Abs Immature Granulocytes: 0.11 10*3/uL — ABNORMAL HIGH (ref 0.00–0.07)
Basophils Absolute: 0 10*3/uL (ref 0.0–0.1)
Basophils Relative: 0 %
Eosinophils Absolute: 0.4 10*3/uL (ref 0.0–0.5)
Eosinophils Relative: 2 %
HCT: 32.5 % — ABNORMAL LOW (ref 36.0–46.0)
Hemoglobin: 10.3 g/dL — ABNORMAL LOW (ref 12.0–15.0)
Immature Granulocytes: 1 %
Lymphocytes Relative: 10 %
Lymphs Abs: 1.5 10*3/uL (ref 0.7–4.0)
MCH: 30.3 pg (ref 26.0–34.0)
MCHC: 31.7 g/dL (ref 30.0–36.0)
MCV: 95.6 fL (ref 80.0–100.0)
Monocytes Absolute: 0.6 10*3/uL (ref 0.1–1.0)
Monocytes Relative: 4 %
Neutro Abs: 12.2 10*3/uL — ABNORMAL HIGH (ref 1.7–7.7)
Neutrophils Relative %: 83 %
Platelets: 220 10*3/uL (ref 150–400)
RBC: 3.4 MIL/uL — ABNORMAL LOW (ref 3.87–5.11)
RDW: 13.2 % (ref 11.5–15.5)
WBC: 14.7 10*3/uL — ABNORMAL HIGH (ref 4.0–10.5)
nRBC: 0 % (ref 0.0–0.2)

## 2020-02-13 LAB — LACTIC ACID, PLASMA
Lactic Acid, Venous: 2.1 mmol/L (ref 0.5–1.9)
Lactic Acid, Venous: 1.3 mmol/L (ref 0.5–1.9)
Lactic Acid, Venous: 1.1 mmol/L (ref 0.5–1.9)
Lactic Acid, Venous: 1.4 mmol/L (ref 0.5–1.9)

## 2020-02-13 LAB — PROTIME-INR
INR: 1.1 (ref 0.8–1.2)
Prothrombin Time: 14.2 seconds (ref 11.4–15.2)

## 2020-02-13 LAB — COMPREHENSIVE METABOLIC PANEL
ALT: 25 U/L (ref 0–44)
AST: 42 U/L — ABNORMAL HIGH (ref 15–41)
Albumin: 3.1 g/dL — ABNORMAL LOW (ref 3.5–5.0)
Alkaline Phosphatase: 132 U/L — ABNORMAL HIGH (ref 38–126)
Anion gap: 10 (ref 5–15)
BUN: 8 mg/dL (ref 8–23)
CO2: 23 mmol/L (ref 22–32)
Calcium: 8.7 mg/dL — ABNORMAL LOW (ref 8.9–10.3)
Chloride: 105 mmol/L (ref 98–111)
Creatinine, Ser: 0.76 mg/dL (ref 0.44–1.00)
GFR calc Af Amer: >60 mL/min (ref >60)
GFR calc non Af Amer: >60 mL/min (ref >60)
Glucose, Bld: 105 mg/dL — ABNORMAL HIGH (ref 70–99)
Potassium: 4.2 mmol/L (ref 3.5–5.1)
Sodium: 138 mmol/L (ref 135–145)
Total Bilirubin: 1 mg/dL (ref 0.3–1.2)
Total Protein: 6.2 g/dL — ABNORMAL LOW (ref 6.5–8.1)

## 2020-02-13 LAB — URINE CULTURE: Culture: 40000 — AB

## 2020-02-13 LAB — PROCALCITONIN: Procalcitonin: 1.74 ng/mL

## 2020-02-13 LAB — CORTISOL-AM, BLOOD: Cortisol - AM: 21.8 ug/dL (ref 6.7–22.6)

## 2020-02-13 LAB — MAGNESIUM: Magnesium: 1.7 mg/dL (ref 1.7–2.4)

## 2020-02-13 LAB — PHOSPHORUS: Phosphorus: 2.4 mg/dL — ABNORMAL LOW (ref 2.5–4.6)

## 2020-02-13 MED ORDER — HYDROCODONE-ACETAMINOPHEN 5-325 MG PO TABS
1.0000 | ORAL_TABLET | Freq: Once | ORAL | Status: AC
  Administered 2020-02-13: 1 via ORAL
  Filled 2020-02-13: qty 1

## 2020-02-13 MED ORDER — SODIUM CHLORIDE 0.9 % IV SOLN
1.0000 g | Freq: Three times a day (TID) | INTRAVENOUS | Status: AC
  Administered 2020-02-13 – 2020-02-16 (×11): 1 g via INTRAVENOUS
  Filled 2020-02-13 (×11): qty 1

## 2020-02-13 NOTE — Evaluation (Signed)
Physical Therapy Evaluation Patient Details Name: Katelyn Lamb MRN: [id number] DOB: [birth year] Today's Date: 02/13/2020   History of Present Illness  74 y.o. female with a history of herpes zoster with prior encephalitis, thrombocytopenia, GERD, anemia, prior DVT, transverse myelitis, recurrent UTI, chronic pain, memory difficulty, and sepsis who presents to the emergency department via EMS for altered mental status and fever. Pt with sepsis secondary to UTI, RLL PE on lovenox.  Clinical Impression   Pt presents with LE weakness suspect secondary to transverse myelitis and deconditioning, impaired LE sensation, difficulty performing mobility tasks, impaired sitting and standing balance, and decreased activity tolerance. Pt to benefit from acute PT to address deficits. Pt required min-mod +2 for bed mobility and transfer from bed to recliner thi day, unable to progress to gait training due to pt fatigue. At baseline, pt receives assist for some ADLs and has close supervision during mobility from husband, PT recommending home with HHPT upon d/c.  PT to progress mobility as tolerated, and will continue to follow acutely.      Follow Up Recommendations Home health PT;Supervision/Assistance - 24 hour    Equipment Recommendations  None recommended by PT    Recommendations for Other Services       Precautions / Restrictions Precautions Precautions: Fall Restrictions Weight Bearing Restrictions: No      Mobility  Bed Mobility Overal bed mobility: Needs Assistance Bed Mobility: Supine to Sit     Supine to sit: Mod assist;+2 for physical assistance;+2 for safety/equipment;HOB elevated     General bed mobility comments: Mod +2 for trunk and LE management, scooting to EOB with use of bed pads. Requires multimodal cuing for sequencing.  Transfers Overall transfer level: Needs assistance Equipment used: Rolling walker (2 wheeled) Transfers: Sit to/from [facility] Sit to  Stand: Mod assist;+2 safety/equipment Stand pivot transfers: +2 safety/equipment;Min assist       General transfer comment: MOd +2 for power up, steadying, verbal cuing for hand placement when rising and correcting posture to upright as pt with R lateral leaning upon standing. Min assist to transfer to recliner via small steps  Ambulation/Gait             General Gait Details: unable to attempt this day  Stairs            Wheelchair Mobility    Modified Rankin (Stroke Patients Only)       Balance Overall balance assessment: Needs assistance Sitting-balance support: Single extremity supported (bed too high for feet to reach floor) Sitting balance-Leahy Scale: Fair Sitting balance - Comments: able to sit statically without assist, posterior leaning with LE movement or scooting tasks Postural control: Right lateral lean;Posterior lean Standing balance support: Bilateral upper extremity supported Standing balance-Leahy Scale: Fair Standing balance comment: cues for posture in standing                             Pertinent Vitals/Pain Pain Assessment: Faces Faces Pain Scale: No hurt Pain Intervention(s): Limited activity within patient's tolerance;Monitored during session;Repositioned    Home Living Family/patient expects to be discharged to:: Private residence Living Arrangements: Spouse/significant other Available Help at Discharge: Family;Available 24 hours/day Type of Home: House Home Access: Stairs to enter Entrance Stairs-Rails: None Entrance Stairs-Number of Steps: 2 Home Layout: Two level;Able to live on main level with bedroom/bathroom Home Equipment: Wheelchair - [employer] - 2 wheels;Tub bench;Grab bars - toilet;Grab bars - tub/shower;Bedside commode Additional Comments: spouse is very  supportive    Prior Function Level of Independence: Needs assistance   Gait / Transfers Assistance Needed: Used RW for ambulation; needed help with  ambulation   ADL's / Homemaking Assistance Needed: husband assists with ADLs as needed        Hand Dominance   Dominant Hand: Right    Extremity/Trunk Assessment   Upper Extremity Assessment Upper Extremity Assessment: Defer to OT evaluation    Lower Extremity Assessment Lower Extremity Assessment: Generalized weakness;RLE deficits/detail;LLE deficits/detail (able to perform SLR, ankle pumps, heel slides bilaterally) RLE Sensation: decreased light touch LLE Sensation: decreased light touch    Cervical / Trunk Assessment Cervical / Trunk Assessment: Normal  Communication   Communication: No difficulties  Cognition Arousal/Alertness: Awake/alert Behavior During Therapy: WFL for tasks assessed/performed Overall Cognitive Status: History of cognitive impairments - at baseline (history of cognitive impairment) Area of Impairment: Safety/judgement;Awareness;Problem solving                   Current Attention Level: Selective   Following Commands: Follows one step commands with increased time Safety/Judgement: Decreased awareness of safety;Decreased awareness of deficits Awareness: Emergent Problem Solving: Slow processing;Decreased initiation;Requires verbal cues;Difficulty sequencing;Requires tactile cues General Comments: A&Ox4, required verbal and tactile cues for sequencing for all tasks      General Comments General comments (skin integrity, edema, etc.): VSS, on 2LO2 via [place]    Exercises     Assessment/Plan    PT Assessment Patient needs continued PT services  PT Problem List Decreased strength;Decreased mobility;Decreased activity tolerance;Decreased balance;Decreased cognition;Decreased safety awareness;Decreased coordination;Decreased knowledge of use of DME;Impaired sensation       PT Treatment Interventions DME instruction;Therapeutic activities;Gait training;Therapeutic exercise;Patient/family education;Balance training;Stair training;Functional  mobility training    PT Goals (Current goals can be found in the Care Plan section)  Acute Rehab PT Goals Patient Stated Goal: go home PT Goal Formulation: With patient Time For Goal Achievement: 02/27/20 Potential to Achieve Goals: Good    Frequency Min 3X/week   Barriers to discharge        Co-evaluation PT/OT/SLP Co-Evaluation/Treatment: Yes Reason for Co-Treatment: Necessary to address cognition/behavior during functional activity;For patient/therapist safety;To address functional/ADL transfers PT goals addressed during session: Mobility/safety with mobility;Balance;Proper use of DME         AM-PAC PT "6 Clicks" Mobility  Outcome Measure Help needed turning from your back to your side while in a flat bed without using bedrails?: A Lot Help needed moving from lying on your back to sitting on the side of a flat bed without using bedrails?: A Lot Help needed moving to and from a bed to a chair (including a wheelchair)?: A Lot Help needed standing up from a chair using your arms (e.g., wheelchair or bedside chair)?: A Little Help needed to walk in hospital room?: A Lot Help needed climbing 3-5 steps with a railing? : A Lot 6 Click Score: 13    End of Session Equipment Utilized During Treatment: Gait belt Activity Tolerance: Patient tolerated treatment well Patient left: with call bell/phone within reach;in chair;with chair alarm set Nurse Communication: Mobility status PT Visit Diagnosis: Unsteadiness on feet (R26.81);Muscle weakness (generalized) (M62.81)    Time: 7829-5621 PT Time Calculation (min) (ACUTE ONLY): 32 min   Charges:   PT Evaluation $PT Eval Low Complexity: 1 Low          Quante Pettry E, PT Acute Rehabilitation Services Pager [phone]  Office [phone]  Mackenzee Becvar D Elonda Husky 02/13/2020, 4:16 PM

## 2020-02-13 NOTE — Progress Notes (Signed)
PHARMACY NOTE:  ANTIMICROBIAL RENAL DOSAGE ADJUSTMENT  Current antimicrobial regimen includes a mismatch between antimicrobial dosage and estimated renal function.  As per policy approved by the Pharmacy & Therapeutics and Medical Executive Committees, the antimicrobial dosage will be adjusted accordingly.  Current antimicrobial dosage: Meropenem 1gm IV q12h  Indication: UTI with Hx ESBL in past 30 days  Renal Function:  Estimated Creatinine Clearance: 63.5 mL/min (by C-G formula based on SCr of 0.76 mg/dL). []      On intermittent HD, scheduled: []      On CRRT    Antimicrobial dosage has been changed to: Meropenem 1gm IV q8h  Additional comments:   Thank you for allowing pharmacy to be a part of this patient's care.  Netta Cedars, [facility] 02/13/2020 7:57 AM

## 2020-02-13 NOTE — Progress Notes (Signed)
[facility] PCCM  S: feels better, has an appetite, off levophed since 2 AM, no longer hallucinating, remembers hallucinating and remembers having a fever yesterday  O:  Vitals:   02/13/20 0500 02/13/20 0600 02/13/20 0700 02/13/20 0800  BP: 130/73 133/76 121/72   Pulse: (!) 112 (!) 113 (!) 121   Resp: (!) 23 (!) 27 (!) 27   Temp:  (!) 100.5 F (38.1 C)  99.2 F (37.3 C)  TempSrc:      SpO2: 97% 94% 93%   Weight:      Height:       RA  General:  Resting comfortably in bed HENT: NCAT OP clear PULM: CTA B, normal effort CV: RRR, no mgr GI: BS+, soft, nontender MSK: normal bulk and tone Neuro: awake, alert, no distress, MAEW  Urine culture> 40K cfu, multiple species  CBC    Component Value Date/Time   WBC 14.7 (H) 02/13/2020 0538   RBC 3.40 (L) 02/13/2020 0538   HGB 10.3 (L) 02/13/2020 0538   HCT 32.5 (L) 02/13/2020 0538   PLT 220 02/13/2020 0538   MCV 95.6 02/13/2020 0538   MCH 30.3 02/13/2020 0538   MCHC 31.7 02/13/2020 0538   RDW 13.2 02/13/2020 0538   LYMPHSABS 1.5 02/13/2020 0538   MONOABS 0.6 02/13/2020 0538   EOSABS 0.4 02/13/2020 0538   BASOSABS 0.0 02/13/2020 0538   BMET    Component Value Date/Time   NA 138 02/13/2020 0538   NA 137 03/02/2019 1612   K 4.2 02/13/2020 0538   CL 105 02/13/2020 0538   CO2 23 02/13/2020 0538   GLUCOSE 105 (H) 02/13/2020 0538   BUN 8 02/13/2020 0538   BUN 8 03/02/2019 1612   CREATININE 0.76 02/13/2020 0538   CREATININE 0.80 01/08/2017 1449   CALCIUM 8.7 (L) 02/13/2020 0538   GFRNONAA >60 02/13/2020 0538   GFRAA >60 02/13/2020 0538    Impression: Septic shock> resolved UTI, history of ESBL Acute encephalopathy, metabolic> resolved  Plan: Continue antibiotics Monitor off of levophed Care per triad  Roselie Awkward, MD [place] P[facility]M Pager: [phone] Cell: [phone] If no response, call [phone]

## 2020-02-13 NOTE — Evaluation (Signed)
Occupational Therapy Evaluation Patient Details Name: Katelyn Lamb MRN: [id number] DOB: 10/02/1945 Today's Date: 02/13/2020    History of Present Illness 74 y.o. female with a history of herpes zoster with prior encephalitis, thrombocytopenia, GERD, anemia, prior DVT, transverse myelitis, recurrent UTI, chronic pain, memory difficulty, and sepsis who presents to the emergency department via EMS for altered mental status and fever.   Clinical Impression   Unsure of patient's reliability as a historian. Today she was mod A +2 for bed mobility with significant assist needed for trunk elevation, she was min to mod +2 for safety with SPT to recliner with RW. She was max A for LB dressing (to don socks) and requires mod cues both physical and verbal for sequencing and attention throughout the entire session. She is a joy to work with with a GREAT sense of humor - no family to determine what true baseline is. Next session plan to have +2 for safety - separate sessions for OT/PT. Pt on 2L O2 throughout session and VSS. OT will continue to follow acutely and recommend HHOT at this time to maximize safety and independence in ADL and transfers as well as work on cognitive strategies in the home setting- she did mention that she falls and "slides off" a lot in home environment. Next session to focus on cognitive strategies as well as functional BUE tasks requiring balance (R lateral lean in sitting and standing today).    Follow Up Recommendations  Home health OT;Supervision/Assistance - 24 hour    Equipment Recommendations  None recommended by OT (Pt has appropriate DME)    Recommendations for Other Services       Precautions / Restrictions Precautions Precautions: Fall Restrictions Weight Bearing Restrictions: No      Mobility Bed Mobility Overal bed mobility: Needs Assistance Bed Mobility: Supine to Sit     Supine to sit: Mod assist;+2 for physical assistance;+2 for safety/equipment;HOB  elevated     General bed mobility comments: cues for sequencing, and mod A +2 to elevate trunk and move legs to EOB  Transfers Overall transfer level: Needs assistance Equipment used: Rolling walker (2 wheeled) Transfers: Sit to/from [facility] Sit to Stand: Mod assist;+2 safety/equipment (vc for safe hand placement, heavy lean to R in standing) Stand pivot transfers: +2 safety/equipment;Min assist       General transfer comment: pt requires constant cues for safety, R lean throughout session.     Balance Overall balance assessment: Needs assistance Sitting-balance support: Single extremity supported (bed too high for feet to reach floor) Sitting balance-Leahy Scale: Poor   Postural control: Right lateral lean;Posterior lean Standing balance support: Bilateral upper extremity supported Standing balance-Leahy Scale: Fair Standing balance comment: cues for posture in standing                           ADL either performed or assessed with clinical judgement   ADL Overall ADL's : Needs assistance/impaired     Grooming: Minimal assistance;Sitting;Cueing for sequencing Grooming Details (indicate cue type and reason): delayed processing, did better when copying therapist Upper Body Bathing: Minimal assistance;Cueing for sequencing;Sitting   Lower Body Bathing: Moderate assistance;Cueing for safety;Sitting/lateral leans;Sit to/from stand   Upper Body Dressing : Moderate assistance;Sitting;Cueing for sequencing   Lower Body Dressing: Moderate assistance;Cueing for safety;Cueing for sequencing;Sitting/lateral leans;Sit to/from stand   Toilet Transfer: Moderate assistance;+2 for safety/equipment;RW;Cueing for safety;Cueing for sequencing Toilet Transfer Details (indicate cue type and reason): increased time for processing  and cues for sequencing/safety - esp in regards to line management Toileting- Clothing Manipulation and Hygiene: Moderate assistance        Functional mobility during ADLs: Minimal assistance;Cueing for safety;Cueing for sequencing;Rolling walker;+2 for safety/equipment General ADL Comments: pt requires min-max assist overall for BADLs secondary to decreased balance and sequencing skills     Vision Patient Visual Report: No change from baseline       Perception     Praxis      Pertinent Vitals/Pain Pain Assessment: Faces Faces Pain Scale: No hurt Pain Intervention(s): Monitored during session;Repositioned     Hand Dominance Right   Extremity/Trunk Assessment Upper Extremity Assessment Upper Extremity Assessment: Generalized weakness   Lower Extremity Assessment Lower Extremity Assessment: Defer to PT evaluation   Cervical / Trunk Assessment Cervical / Trunk Assessment: Normal   Communication Communication Communication: No difficulties   Cognition Arousal/Alertness: Awake/alert Behavior During Therapy: WFL for tasks assessed/performed Overall Cognitive Status: No family/caregiver present to determine baseline cognitive functioning (history of cognitive impairment) Area of Impairment: Safety/judgement;Awareness;Problem solving                         Safety/Judgement: Decreased awareness of safety Awareness: Emergent Problem Solving: Slow processing;Decreased initiation;Requires verbal cues;Difficulty sequencing General Comments: required verbal cues for sequencing for all tasks   General Comments  VSS throughout session on 2L O2    Exercises     Shoulder Instructions      Home Living Family/patient expects to be discharged to:: Private residence Living Arrangements: Spouse/significant other Available Help at Discharge: Family;Available 24 hours/day Type of Home: House Home Access: Stairs to enter [employer] of Steps: 2 Entrance Stairs-Rails: None Home Layout: Two level;Able to live on main level with bedroom/bathroom     Bathroom Shower/Tub: Animal nutritionist: Standard Bathroom Accessibility: No   Home Equipment: Wheelchair - [employer] - 2 wheels;Tub bench;Grab bars - toilet;Grab bars - tub/shower;Bedside commode   Additional Comments: spouse is very supportive      Prior Functioning/Environment Level of Independence: Needs assistance  Gait / Transfers Assistance Needed: Used RW for ambulation; needed help with ambulation  ADL's / Homemaking Assistance Needed: husband assists with ADLs as needed            OT Problem List: Decreased strength;Decreased activity tolerance;Impaired balance (sitting and/or standing);Decreased cognition;Decreased safety awareness      OT Treatment/Interventions: Self-care/ADL training;Therapeutic exercise;Energy conservation;DME and/or AE instruction;Therapeutic activities    OT Goals(Current goals can be found in the care plan section) Acute Rehab OT Goals Patient Stated Goal: get out of bed OT Goal Formulation: With patient Time For Goal Achievement: 02/27/20 Potential to Achieve Goals: Good ADL Goals Pt Will Perform Grooming: with set-up;sitting Pt Will Transfer to Toilet: with supervision;stand pivot transfer Pt Will Perform Toileting - Clothing Manipulation and hygiene: with supervision;sitting/lateral leans Additional ADL Goal #1: Pt will perform bed mobility at min A prior to engaging in ADL  OT Frequency: Min 2X/week   Barriers to D/C:            Co-evaluation              AM-PAC OT "6 Clicks" Daily Activity     Outcome Measure Help from another person eating meals?: A Little Help from another person taking care of personal grooming?: A Little Help from another person toileting, which includes using toliet, bedpan, or urinal?: A Lot Help from another person bathing (including washing, rinsing, drying)?:  A Lot Help from another person to put on and taking off regular upper body clothing?: A Little Help from another person to put on and taking off regular lower  body clothing?: A Lot 6 Click Score: 15   End of Session Equipment Utilized During Treatment: Gait belt;Rolling walker;Oxygen (2L) Nurse Communication: Mobility status;Precautions  Activity Tolerance: Patient tolerated treatment well Patient left: in chair;with chair alarm set;with call bell/phone within reach;Other (comment) (lab doing blood draws in room)  OT Visit Diagnosis: Unsteadiness on feet (R26.81);Muscle weakness (generalized) (M62.81);Other symptoms and signs involving cognitive function                Time: 1013-1045 OT Time Calculation (min): 32 min Charges:  OT General Charges $OT Visit: 1 Visit OT Evaluation $OT Eval Moderate Complexity: [place] OTR/L Acute Rehabilitation Services Pager: [phone] Office: [place] 02/13/2020, 12:52 PM

## 2020-02-13 NOTE — Progress Notes (Signed)
PROGRESS NOTE    Katelyn Lamb  LSL:[id number] DOB: March 04, 1946 DOA: 02/12/2020 PCP: [name], MD   Brief Narrative:   Katelyn Lamb is a 74 y.o. who presents to the emergency department via EMS for altered mental status and fever. Sxs began at midnight.Per EMS they received a call from patient's husband whom she lives with at home that the patient was altered and felt hot to the touch. He described her altered mental status as speaking nonsensically about things like raccoons. Upon EMS arrival patient was febrile to 102,she was tachycardic, and noted to be hypoxic in the mid 80s on room air improved with application of 15 L nonrebreather. Patient reports to me that she has had a cough, she has no other specific complaints but does not necessarilyconsistently answer questions. She denies headache, chest pain, dyspnea, abdominal pain, dysuria, melena, or hematochezia.Level 5 caveat applies secondary to altered mental status. EMS administered 1L NS en route.Patient's husband is reported to be en route to the hospital.Patient has had both doses of her COVID 19 vaccine.  Husband states that patient has been having frequent recurrent UTIs.  Has been working with Dr. [name], ID to try and identify organism causing these frequent infections.  Stated had been sent home on a 10-day course of antibiotics via PICC line, patient did well until IV antibiotics discontinued and then infection returned.  Not on home O2.  A/O x4, follows all commands.  Positive S OB, negative CP, positive back pain  6/12: Pt reports doing better today. Now off pressors. Continue abx. Need to make a decision on transition to oral AC. Gave options to pt. She will discuss with spouse.    Assessment & Plan:   Principal Problem:   Sepsis secondary to UTI [facility]) Active Problems:   Herpes zoster without complication   Thrombocytopenia (HCC)   Incomplete paraplegia (HCC)   Neurogenic bladder   Acute deep vein  thrombosis (DVT) of popliteal vein of left lower extremity (HCC)   Anxiety about health   Reactive depression   Ataxia   Delirium   SIRS (systemic inflammatory response syndrome) (HCC)   Encephalitis due to human herpes simplex virus (HSV)   Chronic neck pain   DVT, lower extremity, distal, chronic (HCC)   Generalized anxiety disorder   Severe sepsis ([place])   UTI (urinary tract infection)   Acute respiratory failure with hypoxia (HCC)   Spinal stenosis   Right pulmonary embolus (HCC)   Acute metabolic encephalopathy  Severe Sepsis secondary to UTI     - Upon admission patient met criteria for sepsis temp> 30 C, HR> 90, RR> 20, lactic acidosis     - Echo: EF 65%, G1DD     - Normal saline 152m/hr     - 6/10 patient seen by [name] ID who recommended Macrobid x30 days for chronic suppression.  Follow-up with Dr. [name] in 2 to 3 weeks.     - We will ensure ID instructions follow once patient stable for discharge.     - 6/12: remains on merrem this morning, WBC ok. temp curve improving, now off pressors, UCx w/ multiple colonies present. Will order recollection, but she's been on abx, so not likely to produce anything. Continue merrem for now.  Neurogenic bladder     - Patient's MAR does not contain medication for neurogenic bladder     - Bladder scan TID; checking for complete emptying of bladder     - If patient  having significant urine retention may be contributing to her frequent UTI we will add medication.     - 6/12: on flomax, but still retaining, place foley; attempt voiding trial as early as possible  Acute respiratory failure with hypoxia     - Patient does not use home O2     - Most likely secondary to acute RIGHT PE     - Titrate O2 to maintain SPO2> 92%     - 6/12: continue tx for PTE; continu IS, flutter, breathing Tx, wean O2 as able  Acute RIGHT lower lobe PE     - CTA PE protocol consistent with acute PE see results above     - Full dose  Lovenox per pharmacy     - lower extremity Doppler: negative     - 6/12: need to make decision on oral AC  Incomplete paraplegia     - Stable  Herpes zoster without complication     - Lyrica 100 mg nightly  Generalized anxiety disorder Reactive depression     - Cymbalta 30 mg every morning/60 mg nightly  Essential HTN     - BP meds held d/t to her need for pressors     - 6/12: now off pressors, but pressure is soft, continue holding home meds for now  Goals of care     - PT/OT consult; evaluate patient for CIR vs SNF vs home health  DVT prophylaxis: lovenox Code Status: FULL Family Communication: Spoke with husband by phone.    Status is: Inpatient  Remains inpatient appropriate because:IV treatments appropriate due to intensity of illness or inability to take PO   Dispo: The patient is from: Home              Anticipated d/c is to: Home              Anticipated d/c date is: > 3 days              Patient currently is not medically stable to d/c.  Consultants:   PCCM  Antimicrobials:  Marland Kitchen Merrem   ROS:  Reports weakness. Denies CP, N, V, dyspnea . Remainder 10-pt ROS is negative for all not previously mentioned.  Subjective: "Is it in my heart or my lungs?"  Objective: Vitals:   02/13/20 0600 02/13/20 0700 02/13/20 0800 02/13/20 0900  BP: 133/76 121/72  (!) 99/57  Pulse: (!) 113 (!) 121  (!) 106  Resp: (!) 27 (!) 27  (!) 25  Temp: (!) 100.5 F (38.1 C)  99.2 F (37.3 C)   TempSrc:      SpO2: 94% 93%  95%  Weight:      Height:        Intake/Output Summary (Last 24 hours) at 02/13/2020 1358 Last data filed at 02/13/2020 1245 Gross per 24 hour  Intake 2068.49 ml  Output 4300 ml  Net -2231.51 ml   Filed Weights   02/12/20 0511 02/12/20 0944  Weight: 73.2 kg 72.7 kg    Examination:  General: 74 y.o. female resting in bed in NAD Cardiovascular: RRR, +S1, S2, no m/g/r, equal pulses throughout Respiratory: CTABL, no w/r/r, normal WOB GI: BS+,  NDNT, no masses noted, no organomegaly noted MSK: No e/c/c Neuro: Alert to name, follows commands Psyc: Appropriate interaction and affect, calm/cooperative   Data Reviewed: I have personally reviewed following labs and imaging studies.  CBC: Recent Labs  Lab 02/12/20 0520 02/13/20 0538  WBC 12.2* 14.7*  NEUTROABS 10.0* 12.2*  HGB 11.5* 10.3*  HCT 37.2 32.5*  MCV 97.9 95.6  PLT 249 149   Basic Metabolic Panel: Recent Labs  Lab 02/12/20 0520 02/13/20 0538  NA 138 138  K 3.7 4.2  CL 101 105  CO2 26 23  GLUCOSE 126* 105*  BUN 10 8  CREATININE 1.05* 0.76  CALCIUM 8.4* 8.7*  MG  --  1.7  PHOS  --  2.4*   GFR: Estimated Creatinine Clearance: 63.5 mL/min (by C-G formula based on SCr of 0.76 mg/dL). Liver Function Tests: Recent Labs  Lab 02/12/20 0520 02/13/20 0538  AST 49* 42*  ALT 26 25  ALKPHOS 160* 132*  BILITOT 0.6 1.0  PROT 6.9 6.2*  ALBUMIN 3.6 3.1*   No results for input(s): LIPASE, AMYLASE in the last 168 hours. No results for input(s): AMMONIA in the last 168 hours. Coagulation Profile: Recent Labs  Lab 02/12/20 0520 02/13/20 0538  INR 1.1 1.1   Cardiac Enzymes: No results for input(s): CKTOTAL, CKMB, CKMBINDEX, TROPONINI in the last 168 hours. BNP (last 3 results) No results for input(s): PROBNP in the last 8760 hours. HbA1C: No results for input(s): HGBA1C in the last 72 hours. CBG: No results for input(s): GLUCAP in the last 168 hours. Lipid Profile: No results for input(s): CHOL, HDL, LDLCALC, TRIG, CHOLHDL, LDLDIRECT in the last 72 hours. Thyroid Function Tests: Recent Labs    02/12/20 0520  TSH 1.734   Anemia Panel: No results for input(s): VITAMINB12, FOLATE, FERRITIN, TIBC, IRON, RETICCTPCT in the last 72 hours. Sepsis Labs: Recent Labs  Lab 02/13/20 0204 02/13/20 0538 02/13/20 0737 02/13/20 1045  PROCALCITON  --  1.74  --   --   LATICACIDVEN 2.1* 1.3 1.1 1.4    Recent Results (from the past 240 hour(s))  SARS  Coronavirus 2 by RT PCR (hospital order, performed in [facility] hospital lab) Nasopharyngeal Nasopharyngeal Swab     Status: None   Collection Time: 02/06/20 11:03 AM   Specimen: Nasopharyngeal Swab  Result Value Ref Range Status   SARS Coronavirus 2 NEGATIVE NEGATIVE Final    Comment: (NOTE) SARS-CoV-2 target nucleic acids are NOT DETECTED. The SARS-CoV-2 RNA is generally detectable in upper and lower respiratory specimens during the acute phase of infection. The lowest concentration of SARS-CoV-2 viral copies this assay can detect is 250 copies / mL. A negative result does not preclude SARS-CoV-2 infection and should not be used as the sole basis for treatment or other patient management decisions.  A negative result may occur with improper specimen collection / handling, submission of specimen other than nasopharyngeal swab, presence of viral mutation(s) within the areas targeted by this assay, and inadequate number of viral copies (<250 copies / mL). A negative result must be combined with clinical observations, patient history, and epidemiological information. Fact Sheet for Patients:   StrictlyIdeas.no Fact Sheet for Healthcare Providers: BankingDealers.co.za This test is not yet approved or cleared  by the [place] FDA and has been authorized for detection and/or diagnosis of SARS-CoV-2 by FDA under an Emergency Use Authorization (EUA).  This EUA will remain in effect (meaning this test can be used) for the duration of the COVID-19 declaration under Section 564(b)(1) of the Act, 21 U.S.C. section 360bbb-3(b)(1), unless the authorization is terminated or revoked sooner. Performed at [facility], [place] [address], [place], [place] [address]   Blood Culture (routine x 2)     Status: None (Preliminary result)   Collection Time: 02/12/20  5:21 AM  Specimen: BLOOD  Result Value Ref Range Status   Specimen Description    Final    BLOOD RIGHT ANTECUBITAL Performed at [facility] [address] [place], [place] [address]    Special Requests   Final    BOTTLES DRAWN AEROBIC AND ANAEROBIC Blood Culture adequate volume Performed at [facility] [address], [place], [place] [address]    Culture   Final    NO GROWTH < 24 HOURS Performed at [facility] [address] [place], [place] [address]    Report Status PENDING  Incomplete  Blood Culture (routine x 2)     Status: None (Preliminary result)   Collection Time: 02/12/20  5:21 AM   Specimen: BLOOD  Result Value Ref Range Status   Specimen Description   Final    BLOOD BLOOD LEFT FOREARM Performed at [facility] [address] [place], [place] [address]    Special Requests   Final    BOTTLES DRAWN AEROBIC AND ANAEROBIC Blood Culture adequate volume Performed at [facility] [address] [place], [place] [address]    Culture   Final    NO GROWTH < 24 HOURS Performed at [facility] [address] [place], [place] [address]    Report Status PENDING  Incomplete  Urine culture     Status: Abnormal   Collection Time: 02/12/20  5:48 AM   Specimen: In/Out Cath Urine  Result Value Ref Range Status   Specimen Description   Final    IN/OUT CATH URINE Performed at [facility] [address] [place], [place] [address]    Special Requests   Final    NONE Performed at [facility], [place] [address] [place], [place] [address]    Culture (A)  Final    40,000 COLONIES/mL MULTIPLE SPECIES PRESENT, SUGGEST RECOLLECTION   Report Status 02/13/2020 FINAL  Final  SARS Coronavirus 2 by RT PCR (hospital order, performed in [facility] hospital lab) Nasopharyngeal Nasopharyngeal Swab     Status: None   Collection Time: 02/12/20  5:48 AM   Specimen: Nasopharyngeal Swab  Result Value Ref Range Status   SARS Coronavirus 2  NEGATIVE NEGATIVE Final    Comment: (NOTE) SARS-CoV-2 target nucleic acids are NOT DETECTED.  The SARS-CoV-2 RNA is generally detectable in upper and lower respiratory specimens during the acute phase of infection. The lowest concentration of SARS-CoV-2 viral copies this assay can detect is 250 copies / mL. A negative result does not preclude SARS-CoV-2 infection and should not be used as the sole basis for treatment or other patient management decisions.  A negative result may occur with improper specimen collection / handling, submission of specimen other than nasopharyngeal swab, presence of viral mutation(s) within the areas targeted by this assay, and inadequate number of viral copies (<250 copies / mL). A negative result must be combined with clinical observations, patient history, and epidemiological information.  Fact Sheet for Patients:   StrictlyIdeas.no  Fact Sheet for Healthcare Providers: BankingDealers.co.za  This test is not yet approved or  cleared by the [place] FDA and has been authorized for detection and/or diagnosis of SARS-CoV-2 by FDA under an Emergency Use Authorization (EUA).  This EUA will remain in effect (meaning this test can be used) for the duration of the COVID-19 declaration under Section 564(b)(1) of the Act, 21 U.S.C. section 360bbb-3(b)(1), unless the authorization is terminated or revoked sooner.  Performed at [facility], [place], [place] [address] [place], [place] [address]   MRSA PCR Screening     Status: None   Collection Time: 02/12/20  9:51 AM   Specimen: Nasal Mucosa; Nasopharyngeal  Result Value Ref Range Status   MRSA by PCR NEGATIVE NEGATIVE Final    Comment:        The GeneXpert MRSA Assay (FDA approved for NASAL specimens only), is one component of a comprehensive MRSA colonization surveillance program. It is not intended to diagnose MRSA infection nor to guide  or monitor treatment for MRSA infections. Performed at [facility], [place] [address], [place], [place] [address]       Radiology Studies: CT Angio Chest PE W/Cm &/Or Wo Cm  Result Date: 02/12/2020 CLINICAL DATA:  Altered mental status.  Fever. EXAM: CT ANGIOGRAPHY CHEST WITH CONTRAST TECHNIQUE: Multidetector CT imaging of the chest was performed using the standard protocol during bolus administration of intravenous contrast. Multiplanar CT image reconstructions and MIPs were obtained to evaluate the vascular anatomy. CONTRAST:  65m OMNIPAQUE IOHEXOL 350 MG/ML SOLN COMPARISON:  Chest x-ray 02/11/2010 and prior chest CT 07/23/2019 FINDINGS: Cardiovascular: The heart is normal in size. No pericardial effusion. Mild tortuosity and ectasia of the thoracic aorta but no aneurysm or dissection. No significant atherosclerotic calcifications. A few calcifications are noted at the aortic valve. The aortic branch vessels are patent. Study is quite limited by poorly opacified pulmonary arteries and excessive breathing motion artifact but there are definite filling defects in right lower lobe arterial branches consistent with pulmonary embolism. Mediastinum/Nodes: No mediastinal or hilar mass or lymphadenopathy. The esophagus is grossly normal. Lungs/Pleura: Limited examination due to excessive breathing motion artifact. I do not see any obvious pulmonary lesions. No pulmonary edema or pleural effusions. Upper Abdomen: No significant upper abdominal findings. Musculoskeletal: No breast masses, supraclavicular or axillary lymphadenopathy. The bony thorax is intact. Review of the MIP images confirms the above findings. IMPRESSION: 1. Very limited examination due to poorly opacified pulmonary arteries and excessive breathing motion artifact. 2. Right lower lobe pulmonary embolism. 3. No thoracic aortic aneurysm or dissection. 4. No obvious pulmonary lesions, pulmonary edema or pleural effusions. 5.  Aortic atherosclerosis. Aortic Atherosclerosis (ICD10-I70.0). Electronically Signed   By: PMarijo SanesM.D.   On: 02/12/2020 08:19   [place]  Result Date: 02/12/2020 CLINICAL DATA:  Sepsis EXAM: RENAL / URINARY TRACT ULTRASOUND COMPLETE COMPARISON:  None. FINDINGS: Right Kidney: Renal measurements: 9.9 x 4.3 x 3.8 cm. = volume: 84.3 mL . Echogenicity within normal limits. No mass or hydronephrosis visualized. Left Kidney: Renal measurements: 9.6 x 4.7 x 4.1 cm = volume: 98 mL. Echogenicity within normal limits. No mass or hydronephrosis visualized. Bladder: Appears normal for degree of bladder distention. Other: None. IMPRESSION: No acute abnormality noted. Electronically Signed   By: MInez CatalinaM.D.   On: 02/12/2020 15:05   DG Chest Port 1 View  Result Date: 02/12/2020 CLINICAL DATA:  Fever EXAM: PORTABLE CHEST 1 VIEW COMPARISON:  01/20/2020 FINDINGS: Stable mild prominence of lower lung markings. There is no edema, consolidation, effusion, or pneumothorax. Normal heart size and mediastinal contours. Artifact from EKG leads. IMPRESSION: No active disease. Electronically Signed   By: JMonte FantasiaM.D.   On: 02/12/2020 06:34   ECHOCARDIOGRAM COMPLETE  Result Date: 02/12/2020    ECHOCARDIOGRAM REPORT   Patient Name:   JSALLI BODINDate of Exam: 02/12/2020 Medical Rec #:  [id number]   Height:  65.0 in Accession #:    [id number]   Weight:       161.4 lb Date of Birth:  [birth year]     BSA:          1.806 m Patient Age:    32 years     BP:           94/73 mmHg Patient Gender: F            HR:           94 bpm. Exam Location:  Inpatient Procedure: 2D Echo, Cardiac Doppler and Color Doppler Indications:    Bacteremia. Severe sepsis  History:        Patient has no prior history of Echocardiogram examinations.                 Abnormal ECG; Signs/Symptoms:Altered Mental Status, Bacteremia                 and Fever. Hypoxia. UTI.  Sonographer:    Roseanna Rainbow RDCS Referring Phys: [id number] [place]   Sonographer Comments: Technically difficult study due to poor echo windows. Pain from probe. Exam interrupted. Difficult exam, patient had pain in the apical region with pressure. IMPRESSIONS  1. Left ventricular ejection fraction, by estimation, is 65 to 70%. The left ventricle has normal function. The left ventricle has no regional wall motion abnormalities. There is mild left ventricular hypertrophy. Left ventricular diastolic parameters are consistent with Grade I diastolic dysfunction (impaired relaxation).  2. Right ventricular systolic function is normal. The right ventricular size is normal.  3. The mitral valve is normal in structure. Trivial mitral valve regurgitation. No evidence of mitral stenosis.  4. The aortic valve is normal in structure. Aortic valve regurgitation is not visualized. Mild to moderate aortic valve sclerosis/calcification is present, without any evidence of aortic stenosis.  5. Aortic dilatation noted. There is mild dilatation of the ascending aorta measuring 38 mm.  6. The inferior vena cava is normal in size with greater than 50% respiratory variability, suggesting right atrial pressure of 3 mmHg. Conclusion(s)/Recommendation(s): No evidence of valvular vegetations on this transthoracic echocardiogram. Would recommend a transesophageal echocardiogram to exclude infective endocarditis if clinically indicated. FINDINGS  Left Ventricle: Left ventricular ejection fraction, by estimation, is 65 to 70%. The left ventricle has normal function. The left ventricle has no regional wall motion abnormalities. The left ventricular internal cavity size was normal in size. There is  mild left ventricular hypertrophy. Left ventricular diastolic parameters are consistent with Grade I diastolic dysfunction (impaired relaxation). Right Ventricle: The right ventricular size is normal. No increase in right ventricular wall thickness. Right ventricular systolic function is normal. Left Atrium: Left atrial  size was normal in size. Right Atrium: Right atrial size was normal in size. Pericardium: There is no evidence of pericardial effusion. Mitral Valve: The mitral valve is normal in structure. Normal mobility of the mitral valve leaflets. Trivial mitral valve regurgitation. No evidence of mitral valve stenosis. Tricuspid Valve: The tricuspid valve is normal in structure. Tricuspid valve regurgitation is not demonstrated. No evidence of tricuspid stenosis. Aortic Valve: The aortic valve is normal in structure.. There is moderate thickening and moderate calcification of the aortic valve. Aortic valve regurgitation is not visualized. Mild to moderate aortic valve sclerosis/calcification is present, without any evidence of aortic stenosis. There is moderate thickening of the aortic valve. There is moderate calcification of the aortic valve. Pulmonic Valve: The pulmonic valve was normal in structure. Pulmonic valve  regurgitation is not visualized. No evidence of pulmonic stenosis. Aorta: Aortic dilatation noted. There is mild dilatation of the ascending aorta measuring 38 mm. Venous: The inferior vena cava is normal in size with greater than 50% respiratory variability, suggesting right atrial pressure of 3 mmHg. IAS/Shunts: No atrial level shunt detected by color flow Doppler.  LEFT VENTRICLE PLAX 2D LVIDd:         3.26 cm     Diastology LVIDs:         1.88 cm     LV e' lateral:   10.60 cm/s LV PW:         0.93 cm     LV E/e' lateral: 7.7 LV IVS:        1.28 cm     LV e' medial:    5.98 cm/s LVOT diam:     1.90 cm     LV E/e' medial:  13.7 LV SV:         64 LV SV Index:   35 LVOT Area:     2.84 cm  LV Volumes (MOD) LV vol d, MOD A2C: 40.4 ml LV vol d, MOD A4C: 43.3 ml LV vol s, MOD A2C: 16.1 ml LV vol s, MOD A4C: 11.8 ml LV SV MOD A2C:     24.3 ml LV SV MOD A4C:     43.3 ml LV SV MOD BP:      29.0 ml RIGHT VENTRICLE            IVC RV S prime:     9.03 cm/s  IVC diam: 1.37 cm TAPSE (M-mode): 1.8 cm LEFT ATRIUM              Index       RIGHT ATRIUM          Index LA diam:        2.60 cm 1.44 cm/m  RA Area:     9.35 cm LA Vol (A2C):   24.6 ml 13.62 ml/m RA Volume:   17.00 ml 9.41 ml/m LA Vol (A4C):   19.4 ml 10.74 ml/m LA Biplane Vol: 24.0 ml 13.29 ml/m  AORTIC VALVE LVOT Vmax:   125.00 cm/s LVOT Vmean:  82.700 cm/s LVOT VTI:    0.224 m  AORTA Ao Root diam: 3.20 cm Ao Asc diam:  3.80 cm MITRAL VALVE MV Area (PHT): 4.80 cm     SHUNTS MV Decel Time: 158 msec     Systemic VTI:  0.22 m MV E velocity: 81.80 cm/s   Systemic Diam: 1.90 cm MV A velocity: 110.00 cm/s MV E/A ratio:  0.74 Candee Furbish MD Electronically signed by Candee Furbish MD Signature Date/Time: 02/12/2020/11:34:44 AM    Final    VAS [place] LOWER EXTREMITY VENOUS (DVT)  Result Date: 02/12/2020  Lower Venous DVTStudy Indications: Pulmonary embolism.  Limitations: Patient contracted position. Hypersomnolence and poor ultrasound/tissue interface. Comparison Study: No prior study Performing Technologist: Maudry Mayhew MHA, RDMS, RVT, RDCS  Examination Guidelines: A complete evaluation includes B-mode imaging, spectral Doppler, color Doppler, and power Doppler as needed of all accessible portions of each vessel. Bilateral testing is considered an integral part of a complete examination. Limited examinations for reoccurring indications may be performed as noted. The reflux portion of the exam is performed with the patient in reverse Trendelenburg.  +---------+---------------+---------+-----------+----------+--------------+ RIGHT    CompressibilityPhasicitySpontaneityPropertiesThrombus Aging +---------+---------------+---------+-----------+----------+--------------+ CFV      Full           Yes  Yes                                 +---------+---------------+---------+-----------+----------+--------------+ SFJ      Full                                                        +---------+---------------+---------+-----------+----------+--------------+ FV  Prox  Full                                                        +---------+---------------+---------+-----------+----------+--------------+ FV Mid   Full                                                        +---------+---------------+---------+-----------+----------+--------------+ FV DistalFull                                                        +---------+---------------+---------+-----------+----------+--------------+ POP      Full           Yes      Yes                                 +---------+---------------+---------+-----------+----------+--------------+   Right Technical Findings: Not visualized segments include PTV, peroneal veins.  +---------+---------------+---------+-----------+----------+--------------+ LEFT     CompressibilityPhasicitySpontaneityPropertiesThrombus Aging +---------+---------------+---------+-----------+----------+--------------+ CFV      Full           Yes      Yes                                 +---------+---------------+---------+-----------+----------+--------------+ FV Prox  Full                                                        +---------+---------------+---------+-----------+----------+--------------+ FV Mid   Full                                                        +---------+---------------+---------+-----------+----------+--------------+ FV DistalFull                                                        +---------+---------------+---------+-----------+----------+--------------+ POP      Full  Yes      Yes                                 +---------+---------------+---------+-----------+----------+--------------+ PTV      Full                    Yes                                 +---------+---------------+---------+-----------+----------+--------------+   Left Technical Findings: Not visualized segments include SFJ, peroneal veins. Limited visualization left PTV.   Summary:  RIGHT: - There is no evidence of deep vein thrombosis in the lower extremity. However, portions of this examination were limited- see technologist comments above.  - No cystic structure found in the popliteal fossa.  LEFT: - There is no evidence of deep vein thrombosis in the lower extremity. However, portions of this examination were limited- see technologist comments above.  - No cystic structure found in the popliteal fossa.  *See table(s) above for measurements and observations. Electronically signed by Monica Martinez MD on 02/12/2020 at 8:34:45 PM.    Final      Scheduled Meds: . Chlorhexidine Gluconate Cloth  6 each Topical Daily  . DULoxetine  30 mg Oral q morning - 10a  . DULoxetine  60 mg Oral QHS  . enoxaparin (LOVENOX) injection  1 mg/kg Subcutaneous Q12H  . mouth rinse  15 mL Mouth Rinse BID  . omega-3 acid ethyl esters  1 g Oral Daily  . pantoprazole  40 mg Oral QHS  . pregabalin  100 mg Oral QHS  . tamsulosin  0.4 mg Oral Daily   Continuous Infusions: . sodium chloride 100 mL/hr at 02/13/20 0953  . sodium chloride 10 mL/hr at 02/12/20 1800  . sodium chloride    . meropenem (MERREM) IV Stopped (02/13/20 1051)  . norepinephrine (LEVOPHED) Adult infusion 1 mcg/min (02/12/20 1813)     LOS: 1 day    Time spent: 35 minutes spent in the coordination of care today.    Jonnie Finner, DO [facility]  If 7PM-7AM, please contact night-coverage [email or web address] 02/13/2020, 1:58 PM

## 2020-02-14 LAB — COMPREHENSIVE METABOLIC PANEL
ALT: 20 U/L (ref 0–44)
AST: 22 U/L (ref 15–41)
Albumin: 2.6 g/dL — ABNORMAL LOW (ref 3.5–5.0)
Alkaline Phosphatase: 113 U/L (ref 38–126)
Anion gap: 6 (ref 5–15)
BUN: 8 mg/dL (ref 8–23)
CO2: 26 mmol/L (ref 22–32)
Calcium: 8.3 mg/dL — ABNORMAL LOW (ref 8.9–10.3)
Chloride: 110 mmol/L (ref 98–111)
Creatinine, Ser: 0.74 mg/dL (ref 0.44–1.00)
GFR calc Af Amer: >60 mL/min (ref >60)
GFR calc non Af Amer: >60 mL/min (ref >60)
Glucose, Bld: 110 mg/dL — ABNORMAL HIGH (ref 70–99)
Potassium: 4 mmol/L (ref 3.5–5.1)
Sodium: 142 mmol/L (ref 135–145)
Total Bilirubin: 0.5 mg/dL (ref 0.3–1.2)
Total Protein: 5.6 g/dL — ABNORMAL LOW (ref 6.5–8.1)

## 2020-02-14 LAB — CBC WITH DIFFERENTIAL/PLATELET
Abs Immature Granulocytes: 0.03 10*3/uL (ref 0.00–0.07)
Basophils Absolute: 0 10*3/uL (ref 0.0–0.1)
Basophils Relative: 0 %
Eosinophils Absolute: 0.9 10*3/uL — ABNORMAL HIGH (ref 0.0–0.5)
Eosinophils Relative: 10 %
HCT: 30.4 % — ABNORMAL LOW (ref 36.0–46.0)
Hemoglobin: 9.5 g/dL — ABNORMAL LOW (ref 12.0–15.0)
Immature Granulocytes: 0 %
Lymphocytes Relative: 29 %
Lymphs Abs: 2.6 10*3/uL (ref 0.7–4.0)
MCH: 30.5 pg (ref 26.0–34.0)
MCHC: 31.3 g/dL (ref 30.0–36.0)
MCV: 97.7 fL (ref 80.0–100.0)
Monocytes Absolute: 0.4 10*3/uL (ref 0.1–1.0)
Monocytes Relative: 5 %
Neutro Abs: 4.9 10*3/uL (ref 1.7–7.7)
Neutrophils Relative %: 56 %
Platelets: 195 10*3/uL (ref 150–400)
RBC: 3.11 MIL/uL — ABNORMAL LOW (ref 3.87–5.11)
RDW: 13.5 % (ref 11.5–15.5)
WBC: 8.9 10*3/uL (ref 4.0–10.5)
nRBC: 0 % (ref 0.0–0.2)

## 2020-02-14 LAB — URINE CULTURE: Culture: NO GROWTH

## 2020-02-14 LAB — MAGNESIUM: Magnesium: 1.8 mg/dL (ref 1.7–2.4)

## 2020-02-14 LAB — PHOSPHORUS: Phosphorus: 2.5 mg/dL (ref 2.5–4.6)

## 2020-02-14 MED ORDER — RIVAROXABAN 15 MG PO TABS
15.0000 mg | ORAL_TABLET | Freq: Two times a day (BID) | ORAL | Status: DC
  Administered 2020-02-14 – 2020-02-17 (×6): 15 mg via ORAL
  Filled 2020-02-14 (×6): qty 1

## 2020-02-14 MED ORDER — RIVAROXABAN 20 MG PO TABS: 20.0000 mg | ORAL_TABLET | Freq: Every day | ORAL | Status: DC

## 2020-02-14 NOTE — Progress Notes (Signed)
PROGRESS NOTE    LEE KALT  OVF:[id number] DOB: Mar 20, 1946 DOA: 02/12/2020 PCP: [name], MD   Brief Narrative:   Katelyn Lamb is a 74 y.o. who presents to the emergency department via EMS for altered mental status and fever. Sxs began at midnight.Per EMS they received a call from patient's husband whom she lives with at home that the patient was altered and felt hot to the touch. He described her altered mental status as speaking nonsensically about things like raccoons. Upon EMS arrival patient was febrile to 102,she was tachycardic, and noted to be hypoxic in the mid 80s on room air improved with application of 15 L nonrebreather. Patient reports to me that she has had a cough, she has no other specific complaints but does not necessarilyconsistently answer questions. She denies headache, chest pain, dyspnea, abdominal pain, dysuria, melena, or hematochezia.Level 5 caveat applies secondary to altered mental status. EMS administered 1L NS en route.Patient's husband is reported to be en route to the hospital.Patient has had both doses of her COVID 19 vaccine.  Husband states that patient has been having frequent recurrent UTIs.  Has been working with Dr. [name], ID to try and identify organism causing these frequent infections.  Stated had been sent home on a 10-day course of antibiotics via PICC line, patient did well until IV antibiotics discontinued and then infection returned.  Not on home O2.  A/O x4, follows all commands.  Positive S OB, negative CP, positive back pain  6/12: Pt reports doing better today. Now off pressors. Continue abx. Need to make a decision on transition to oral AC. Gave options to pt. She will discuss with spouse.  6/13: Try voiding trial today. Spoke with husband. Will switch to xarelto. Follow. I think she's stable to transfer to telemetry.    Assessment & Plan:   Principal Problem:   Sepsis secondary to UTI [facility]) Active Problems:    Herpes zoster without complication   Thrombocytopenia (HCC)   Incomplete paraplegia (HCC)   Neurogenic bladder   Acute deep vein thrombosis (DVT) of popliteal vein of left lower extremity (HCC)   Anxiety about health   Reactive depression   Ataxia   Delirium   SIRS (systemic inflammatory response syndrome) (HCC)   Encephalitis due to human herpes simplex virus (HSV)   Chronic neck pain   DVT, lower extremity, distal, chronic (HCC)   Generalized anxiety disorder   Severe sepsis ([place])   UTI (urinary tract infection)   Acute respiratory failure with hypoxia (HCC)   Spinal stenosis   Right pulmonary embolus (HCC)   Acute metabolic encephalopathy  Severe Sepsis secondary to UTI     - Upon admission patient met criteria for sepsis temp> 30 C, HR> 90, RR> 20, lactic acidosis     - Echo: EF 65%, G1DD     - Normal saline 150m/hr     - 6/10 patient seen by [name] ID who recommended Macrobid x30 days for chronic suppression.  Follow-up with Dr. [name] in 2 to 3 weeks.     - We will ensure ID instructions follow once patient stable for discharge.     - 6/12: remains on merrem this morning, WBC ok. temp curve improving, now off pressors, UCx w/ multiple colonies present. Will order recollection, but she's been on abx, so not likely to produce anything. Continue merrem for now.     - 6/13: Rpt UCx w/ no growth. We will extend her merrem  coverage to 5 days and then continue her suppressive macrobid. TTF.    Neurogenic bladder     - Patient's MAR does not contain medication for neurogenic bladder     - Bladder scan TID; checking for complete emptying of bladder     - If patient having significant urine retention may be contributing to her frequent UTI we will add medication.     - 6/12: on flomax, but still retaining, place foley; attempt voiding trial as early as possible     - 6/13: voiding trial today.   Acute respiratory failure with hypoxia Acute RIGHT lower lobe  PE     - Patient does not use home O2     - Most likely secondary to acute RIGHT PE     - Titrate O2 to maintain SPO2> 92%     - CTA PE protocol consistent with acute PE see results above     - Full dose Lovenox per pharmacy     - lower extremity Doppler: negative     - 6/13: transition to xarelto, continue IS, flutter, wean O2 as able  Incomplete paraplegia     - Stable  Herpes zoster without complication     - Lyrica 100 mg nightly  Generalized anxiety disorder Reactive depression     - Cymbalta 30 mg every morning/60 mg nightly  Essential HTN     - BP meds held d/t to her need for pressors     - 6/12: now off pressors, but pressure is soft, continue holding home meds for now     - 6/13: BP is better today; decrease fluids, can start home meds in AM if BP remain stable or elevated  Goals of care     - PT/OT consult; evaluate patient for CIR vs SNF vs home health     - 6/13: PT/OT rec HH; consult TOC  DVT prophylaxis: lovenox Code Status: FULL Family Communication: With husband by phone.   Status is: Inpatient  Remains inpatient appropriate because:IV treatments appropriate due to intensity of illness or inability to take PO   Dispo: The patient is from: Home              Anticipated d/c is to: Home              Anticipated d/c date is: > 3 days              Patient currently is not medically stable to d/c.  Consultants:   PCCM  Antimicrobials:  Marland Kitchen Merrem   ROS:  Denies CP, N, V, ab pain, dyspnea . Remainder 10-pt ROS is negative for all not previously mentioned.  Subjective: "I remember being on that."  Objective: Vitals:   02/14/20 0100 02/14/20 0200 02/14/20 0300 02/14/20 0400  BP: (!) 108/58 (!) 107/59 (!) 149/73 (!) 103/54  Pulse:  88 100 94  Resp: 19 (!) 24 19 (!) 25  Temp:    98.8 F (37.1 C)  TempSrc:    Oral  SpO2:  97% 96% 95%  Weight:      Height:        Intake/Output Summary (Last 24 hours) at 02/14/2020 0720 Last data filed at  02/13/2020 1827 Gross per 24 hour  Intake 2135.89 ml  Output 1650 ml  Net 485.89 ml   Filed Weights   02/12/20 0511 02/12/20 0944  Weight: 73.2 kg 72.7 kg    Examination:  General: 74 y.o. female resting in bed in NAD Cardiovascular:  RRR, +S1, S2, no m/g/r, equal pulses throughout Respiratory: CTABL, no w/r/r, normal WOB GI: BS+, NDNT, soft MSK: No e/c/c Neuro: Alert to name, follows commands   Data Reviewed: I have personally reviewed following labs and imaging studies.  CBC: Recent Labs  Lab 02/12/20 0520 02/13/20 0538 02/14/20 0244  WBC 12.2* 14.7* 8.9  NEUTROABS 10.0* 12.2* 4.9  HGB 11.5* 10.3* 9.5*  HCT 37.2 32.5* 30.4*  MCV 97.9 95.6 97.7  PLT 249 220 528   Basic Metabolic Panel: Recent Labs  Lab 02/12/20 0520 02/13/20 0538 02/14/20 0244  NA 138 138 142  K 3.7 4.2 4.0  CL 101 105 110  CO2 _0 GLUCOSE 126* 105* 110*  BUN _1 CREATININE 1.05* 0.76 0.74  CALCIUM 8.4* 8.7* 8.3*  MG  --  1.7 1.8  PHOS  --  2.4* 2.5   GFR: Estimated Creatinine Clearance: 63.5 mL/min (by C-G formula based on SCr of 0.74 mg/dL). Liver Function Tests: Recent Labs  Lab 02/12/20 0520 02/13/20 0538 02/14/20 0244  AST 49* 42* 22  ALT _2 ALKPHOS 160* 132* 113  BILITOT 0.6 1.0 0.5  PROT 6.9 6.2* 5.6*  ALBUMIN 3.6 3.1* 2.6*   No results for input(s): LIPASE, AMYLASE in the last 168 hours. No results for input(s): AMMONIA in the last 168 hours. Coagulation Profile: Recent Labs  Lab 02/12/20 0520 02/13/20 0538  INR 1.1 1.1   Cardiac Enzymes: No results for input(s): CKTOTAL, CKMB, CKMBINDEX, TROPONINI in the last 168 hours. BNP (last 3 results) No results for input(s): PROBNP in the last 8760 hours. HbA1C: No results for input(s): HGBA1C in the last 72 hours. CBG: No results for input(s): GLUCAP in the last 168 hours. Lipid Profile: No results for input(s): CHOL, HDL, LDLCALC, TRIG, CHOLHDL, LDLDIRECT in the last 72 hours. Thyroid Function  Tests: Recent Labs    02/12/20 0520  TSH 1.734   Anemia Panel: No results for input(s): VITAMINB12, FOLATE, FERRITIN, TIBC, IRON, RETICCTPCT in the last 72 hours. Sepsis Labs: Recent Labs  Lab 02/13/20 0204 02/13/20 0538 02/13/20 0737 02/13/20 1045  PROCALCITON  --  1.74  --   --   LATICACIDVEN 2.1* 1.3 1.1 1.4    Recent Results (from the past 240 hour(s))  SARS Coronavirus 2 by RT PCR (hospital order, performed in [facility] hospital lab) Nasopharyngeal Nasopharyngeal Swab     Status: None   Collection Time: 02/06/20 11:03 AM   Specimen: Nasopharyngeal Swab  Result Value Ref Range Status   SARS Coronavirus 2 NEGATIVE NEGATIVE Final    Comment: (NOTE) SARS-CoV-2 target nucleic acids are NOT DETECTED. The SARS-CoV-2 RNA is generally detectable in upper and lower respiratory specimens during the acute phase of infection. The lowest concentration of SARS-CoV-2 viral copies this assay can detect is 250 copies / mL. A negative result does not preclude SARS-CoV-2 infection and should not be used as the sole basis for treatment or other patient management decisions.  A negative result may occur with improper specimen collection / handling, submission of specimen other than nasopharyngeal swab, presence of viral mutation(s) within the areas targeted by this assay, and inadequate number of viral copies (<250 copies / mL). A negative result must be combined with clinical observations, patient history, and epidemiological information. Fact Sheet for Patients:   StrictlyIdeas.no Fact Sheet for Healthcare Providers: BankingDealers.co.za This test is not yet approved or cleared  by the [place] FDA and has been authorized for detection  and/or diagnosis of SARS-CoV-2 by FDA under an Emergency Use Authorization (EUA).  This EUA will remain in effect (meaning this test can be used) for the duration of the COVID-19 declaration under  Section 564(b)(1) of the Act, 21 U.S.C. section 360bbb-3(b)(1), unless the authorization is terminated or revoked sooner. Performed at [facility], [place] [address] [place], [place] [address]   Blood Culture (routine x 2)     Status: None (Preliminary result)   Collection Time: 02/12/20  5:21 AM   Specimen: BLOOD  Result Value Ref Range Status   Specimen Description   Final    BLOOD RIGHT ANTECUBITAL Performed at [facility] [address], [place], [place] [address]    Special Requests   Final    BOTTLES DRAWN AEROBIC AND ANAEROBIC Blood Culture adequate volume Performed at [facility] [address], [place], [place] [address]    Culture   Final    NO GROWTH < 24 HOURS Performed at [facility] [address], [place], [place] [address]    Report Status PENDING  Incomplete  Blood Culture (routine x 2)     Status: None (Preliminary result)   Collection Time: 02/12/20  5:21 AM   Specimen: BLOOD  Result Value Ref Range Status   Specimen Description   Final    BLOOD BLOOD LEFT FOREARM Performed at [facility] [address], [place], [place] [address]    Special Requests   Final    BOTTLES DRAWN AEROBIC AND ANAEROBIC Blood Culture adequate volume Performed at [facility] [address], [place], [place] [address]    Culture   Final    NO GROWTH < 24 HOURS Performed at [facility] [address], [place], [place] [address]    Report Status PENDING  Incomplete  Urine culture     Status: Abnormal   Collection Time: 02/12/20  5:48 AM   Specimen: In/Out Cath Urine  Result Value Ref Range Status   Specimen Description   Final    IN/OUT CATH URINE Performed at [facility] [address], [place], [place] [address]    Special Requests   Final    NONE Performed at [facility], [place] [address] [place],  [address]    Culture  (A)  Final    40,000 COLONIES/mL MULTIPLE SPECIES PRESENT, SUGGEST RECOLLECTION   Report Status 02/13/2020 FINAL  Final  SARS Coronavirus 2 by RT PCR (hospital order, performed in [facility] hospital lab) Nasopharyngeal Nasopharyngeal Swab     Status: None   Collection Time: 02/12/20  5:48 AM   Specimen: Nasopharyngeal Swab  Result Value Ref Range Status   SARS Coronavirus 2 NEGATIVE NEGATIVE Final    Comment: (NOTE) SARS-CoV-2 target nucleic acids are NOT DETECTED.  The SARS-CoV-2 RNA is generally detectable in upper and lower respiratory specimens during the acute phase of infection. The lowest concentration of SARS-CoV-2 viral copies this assay can detect is 250 copies / mL. A negative result does not preclude SARS-CoV-2 infection and should not be used as the sole basis for treatment or other patient management decisions.  A negative result may occur with improper specimen collection / handling, submission of specimen other than nasopharyngeal swab, presence of viral mutation(s) within the areas targeted by this assay, and inadequate number of viral copies (<250 copies / mL). A negative result must be combined with clinical observations, patient history, and  epidemiological information.  Fact Sheet for Patients:   StrictlyIdeas.no  Fact Sheet for Healthcare Providers: BankingDealers.co.za  This test is not yet approved or  cleared by the [place] FDA and has been authorized for detection and/or diagnosis of SARS-CoV-2 by FDA under an Emergency Use Authorization (EUA).  This EUA will remain in effect (meaning this test can be used) for the duration of the COVID-19 declaration under Section 564(b)(1) of the Act, 21 U.S.C. section 360bbb-3(b)(1), unless the authorization is terminated or revoked sooner.  Performed at [facility], [place] [address], [place], [place] [address]   MRSA PCR Screening     Status:  None   Collection Time: 02/12/20  9:51 AM   Specimen: Nasal Mucosa; Nasopharyngeal  Result Value Ref Range Status   MRSA by PCR NEGATIVE NEGATIVE Final    Comment:        The GeneXpert MRSA Assay (FDA approved for NASAL specimens only), is one component of a comprehensive MRSA colonization surveillance program. It is not intended to diagnose MRSA infection nor to guide or monitor treatment for MRSA infections. Performed at [facility], [place] [address], [place], [place] [address]       Radiology Studies: CT Angio Chest PE W/Cm &/Or Wo Cm  Result Date: 02/12/2020 CLINICAL DATA:  Altered mental status.  Fever. EXAM: CT ANGIOGRAPHY CHEST WITH CONTRAST TECHNIQUE: Multidetector CT imaging of the chest was performed using the standard protocol during bolus administration of intravenous contrast. Multiplanar CT image reconstructions and MIPs were obtained to evaluate the vascular anatomy. CONTRAST:  58m OMNIPAQUE IOHEXOL 350 MG/ML SOLN COMPARISON:  Chest x-ray 02/11/2010 and prior chest CT 07/23/2019 FINDINGS: Cardiovascular: The heart is normal in size. No pericardial effusion. Mild tortuosity and ectasia of the thoracic aorta but no aneurysm or dissection. No significant atherosclerotic calcifications. A few calcifications are noted at the aortic valve. The aortic branch vessels are patent. Study is quite limited by poorly opacified pulmonary arteries and excessive breathing motion artifact but there are definite filling defects in right lower lobe arterial branches consistent with pulmonary embolism. Mediastinum/Nodes: No mediastinal or hilar mass or lymphadenopathy. The esophagus is grossly normal. Lungs/Pleura: Limited examination due to excessive breathing motion artifact. I do not see any obvious pulmonary lesions. No pulmonary edema or pleural effusions. Upper Abdomen: No significant upper abdominal findings. Musculoskeletal: No breast masses, supraclavicular or axillary  lymphadenopathy. The bony thorax is intact. Review of the MIP images confirms the above findings. IMPRESSION: 1. Very limited examination due to poorly opacified pulmonary arteries and excessive breathing motion artifact. 2. Right lower lobe pulmonary embolism. 3. No thoracic aortic aneurysm or dissection. 4. No obvious pulmonary lesions, pulmonary edema or pleural effusions. 5. Aortic atherosclerosis. Aortic Atherosclerosis (ICD10-I70.0). Electronically Signed   By: PMarijo SanesM.D.   On: 02/12/2020 08:19   [place]  Result Date: 02/12/2020 CLINICAL DATA:  Sepsis EXAM: RENAL / URINARY TRACT ULTRASOUND COMPLETE COMPARISON:  None. FINDINGS: Right Kidney: Renal measurements: 9.9 x 4.3 x 3.8 cm. = volume: 84.3 mL . Echogenicity within normal limits. No mass or hydronephrosis visualized. Left Kidney: Renal measurements: 9.6 x 4.7 x 4.1 cm = volume: 98 mL. Echogenicity within normal limits. No mass or hydronephrosis visualized. Bladder: Appears normal for degree of bladder distention. Other: None. IMPRESSION: No acute abnormality noted. Electronically Signed   By: MInez CatalinaM.D.   On: 02/12/2020 15:05   DG CHEST PORT 1 VIEW  Result Date: 02/13/2020 CLINICAL DATA:  Wheezing. EXAM:  PORTABLE CHEST 1 VIEW COMPARISON:  Radiograph and chest CTA yesterday. FINDINGS: Normal heart size with unchanged mediastinal contours. Trace blunting of the costophrenic angles, new from prior may represent small effusions. No focal airspace disease, pulmonary edema, or pneumothorax. Stable osseous structures. IMPRESSION: Trace blunting of the costophrenic angles, new from prior may represent small effusions. Otherwise stable exam without acute findings. Electronically Signed   By: Keith Rake M.D.   On: 02/13/2020 18:00   ECHOCARDIOGRAM COMPLETE  Result Date: 02/12/2020    ECHOCARDIOGRAM REPORT   Patient Name:   TAMATHA GADBOIS Date of Exam: 02/12/2020 Medical Rec #:  [id number]    Height:       65.0 in Accession #:     [id number]   Weight:       161.4 lb Date of Birth:  Feb 14, 1946     BSA:          1.806 m Patient Age:    36 years     BP:           94/73 mmHg Patient Gender: F            HR:           94 bpm. Exam Location:  Inpatient Procedure: 2D Echo, Cardiac Doppler and Color Doppler Indications:    Bacteremia. Severe sepsis  History:        Patient has no prior history of Echocardiogram examinations.                 Abnormal ECG; Signs/Symptoms:Altered Mental Status, Bacteremia                 and Fever. Hypoxia. UTI.  Sonographer:    Roseanna Rainbow RDCS Referring Phys: [id number] [place]  Sonographer Comments: Technically difficult study due to poor echo windows. Pain from probe. Exam interrupted. Difficult exam, patient had pain in the apical region with pressure. IMPRESSIONS  1. Left ventricular ejection fraction, by estimation, is 65 to 70%. The left ventricle has normal function. The left ventricle has no regional wall motion abnormalities. There is mild left ventricular hypertrophy. Left ventricular diastolic parameters are consistent with Grade I diastolic dysfunction (impaired relaxation).  2. Right ventricular systolic function is normal. The right ventricular size is normal.  3. The mitral valve is normal in structure. Trivial mitral valve regurgitation. No evidence of mitral stenosis.  4. The aortic valve is normal in structure. Aortic valve regurgitation is not visualized. Mild to moderate aortic valve sclerosis/calcification is present, without any evidence of aortic stenosis.  5. Aortic dilatation noted. There is mild dilatation of the ascending aorta measuring 38 mm.  6. The inferior vena cava is normal in size with greater than 50% respiratory variability, suggesting right atrial pressure of 3 mmHg. Conclusion(s)/Recommendation(s): No evidence of valvular vegetations on this transthoracic echocardiogram. Would recommend a transesophageal echocardiogram to exclude infective endocarditis if clinically indicated.  FINDINGS  Left Ventricle: Left ventricular ejection fraction, by estimation, is 65 to 70%. The left ventricle has normal function. The left ventricle has no regional wall motion abnormalities. The left ventricular internal cavity size was normal in size. There is  mild left ventricular hypertrophy. Left ventricular diastolic parameters are consistent with Grade I diastolic dysfunction (impaired relaxation). Right Ventricle: The right ventricular size is normal. No increase in right ventricular wall thickness. Right ventricular systolic function is normal. Left Atrium: Left atrial size was normal in size. Right Atrium: Right atrial size was normal in size. Pericardium: There is  no evidence of pericardial effusion. Mitral Valve: The mitral valve is normal in structure. Normal mobility of the mitral valve leaflets. Trivial mitral valve regurgitation. No evidence of mitral valve stenosis. Tricuspid Valve: The tricuspid valve is normal in structure. Tricuspid valve regurgitation is not demonstrated. No evidence of tricuspid stenosis. Aortic Valve: The aortic valve is normal in structure.. There is moderate thickening and moderate calcification of the aortic valve. Aortic valve regurgitation is not visualized. Mild to moderate aortic valve sclerosis/calcification is present, without any evidence of aortic stenosis. There is moderate thickening of the aortic valve. There is moderate calcification of the aortic valve. Pulmonic Valve: The pulmonic valve was normal in structure. Pulmonic valve regurgitation is not visualized. No evidence of pulmonic stenosis. Aorta: Aortic dilatation noted. There is mild dilatation of the ascending aorta measuring 38 mm. Venous: The inferior vena cava is normal in size with greater than 50% respiratory variability, suggesting right atrial pressure of 3 mmHg. IAS/Shunts: No atrial level shunt detected by color flow Doppler.  LEFT VENTRICLE PLAX 2D LVIDd:         3.26 cm     Diastology LVIDs:          1.88 cm     LV e' lateral:   10.60 cm/s LV PW:         0.93 cm     LV E/e' lateral: 7.7 LV IVS:        1.28 cm     LV e' medial:    5.98 cm/s LVOT diam:     1.90 cm     LV E/e' medial:  13.7 LV SV:         64 LV SV Index:   35 LVOT Area:     2.84 cm  LV Volumes (MOD) LV vol d, MOD A2C: 40.4 ml LV vol d, MOD A4C: 43.3 ml LV vol s, MOD A2C: 16.1 ml LV vol s, MOD A4C: 11.8 ml LV SV MOD A2C:     24.3 ml LV SV MOD A4C:     43.3 ml LV SV MOD BP:      29.0 ml RIGHT VENTRICLE            IVC RV S prime:     9.03 cm/s  IVC diam: 1.37 cm TAPSE (M-mode): 1.8 cm LEFT ATRIUM             Index       RIGHT ATRIUM          Index LA diam:        2.60 cm 1.44 cm/m  RA Area:     9.35 cm LA Vol (A2C):   24.6 ml 13.62 ml/m RA Volume:   17.00 ml 9.41 ml/m LA Vol (A4C):   19.4 ml 10.74 ml/m LA Biplane Vol: 24.0 ml 13.29 ml/m  AORTIC VALVE LVOT Vmax:   125.00 cm/s LVOT Vmean:  82.700 cm/s LVOT VTI:    0.224 m  AORTA Ao Root diam: 3.20 cm Ao Asc diam:  3.80 cm MITRAL VALVE MV Area (PHT): 4.80 cm     SHUNTS MV Decel Time: 158 msec     Systemic VTI:  0.22 m MV E velocity: 81.80 cm/s   Systemic Diam: 1.90 cm MV A velocity: 110.00 cm/s MV E/A ratio:  0.74 Candee Furbish MD Electronically signed by Candee Furbish MD Signature Date/Time: 02/12/2020/11:34:44 AM    Final    VAS [place] LOWER EXTREMITY VENOUS (DVT)  Result Date: 02/12/2020  Lower Venous  DVTStudy Indications: Pulmonary embolism.  Limitations: Patient contracted position. Hypersomnolence and poor ultrasound/tissue interface. Comparison Study: No prior study Performing Technologist: Maudry Mayhew MHA, RDMS, RVT, RDCS  Examination Guidelines: A complete evaluation includes B-mode imaging, spectral Doppler, color Doppler, and power Doppler as needed of all accessible portions of each vessel. Bilateral testing is considered an integral part of a complete examination. Limited examinations for reoccurring indications may be performed as noted. The reflux portion of the exam is  performed with the patient in reverse Trendelenburg.  +---------+---------------+---------+-----------+----------+--------------+ RIGHT    CompressibilityPhasicitySpontaneityPropertiesThrombus Aging +---------+---------------+---------+-----------+----------+--------------+ CFV      Full           Yes      Yes                                 +---------+---------------+---------+-----------+----------+--------------+ SFJ      Full                                                        +---------+---------------+---------+-----------+----------+--------------+ FV Prox  Full                                                        +---------+---------------+---------+-----------+----------+--------------+ FV Mid   Full                                                        +---------+---------------+---------+-----------+----------+--------------+ FV DistalFull                                                        +---------+---------------+---------+-----------+----------+--------------+ POP      Full           Yes      Yes                                 +---------+---------------+---------+-----------+----------+--------------+   Right Technical Findings: Not visualized segments include PTV, peroneal veins.  +---------+---------------+---------+-----------+----------+--------------+ LEFT     CompressibilityPhasicitySpontaneityPropertiesThrombus Aging +---------+---------------+---------+-----------+----------+--------------+ CFV      Full           Yes      Yes                                 +---------+---------------+---------+-----------+----------+--------------+ FV Prox  Full                                                        +---------+---------------+---------+-----------+----------+--------------+ FV Mid   Full                                                         +---------+---------------+---------+-----------+----------+--------------+  FV DistalFull                                                        +---------+---------------+---------+-----------+----------+--------------+ POP      Full           Yes      Yes                                 +---------+---------------+---------+-----------+----------+--------------+ PTV      Full                    Yes                                 +---------+---------------+---------+-----------+----------+--------------+   Left Technical Findings: Not visualized segments include SFJ, peroneal veins. Limited visualization left PTV.   Summary: RIGHT: - There is no evidence of deep vein thrombosis in the lower extremity. However, portions of this examination were limited- see technologist comments above.  - No cystic structure found in the popliteal fossa.  LEFT: - There is no evidence of deep vein thrombosis in the lower extremity. However, portions of this examination were limited- see technologist comments above.  - No cystic structure found in the popliteal fossa.  *See table(s) above for measurements and observations. Electronically signed by Monica Martinez MD on 02/12/2020 at 8:34:45 PM.    Final      Scheduled Meds: . Chlorhexidine Gluconate Cloth  6 each Topical Daily  . DULoxetine  30 mg Oral q morning - 10a  . DULoxetine  60 mg Oral QHS  . enoxaparin (LOVENOX) injection  1 mg/kg Subcutaneous Q12H  . mouth rinse  15 mL Mouth Rinse BID  . omega-3 acid ethyl esters  1 g Oral Daily  . pantoprazole  40 mg Oral QHS  . pregabalin  100 mg Oral QHS  . tamsulosin  0.4 mg Oral Daily   Continuous Infusions: . sodium chloride 100 mL/hr at 02/13/20 2102  . sodium chloride 10 mL/hr at 02/12/20 1800  . sodium chloride    . meropenem (MERREM) IV Stopped (02/14/20 0335)  . norepinephrine (LEVOPHED) Adult infusion 1 mcg/min (02/12/20 1813)     LOS: 2 days    Time spent: 35 minutes spent in the  coordination of care today.    Jonnie Finner, DO [facility]  If 7PM-7AM, please contact night-coverage [email or web address] 02/14/2020, 7:20 AM

## 2020-02-15 LAB — CBC WITH DIFFERENTIAL/PLATELET
Abs Immature Granulocytes: 0.04 10*3/uL (ref 0.00–0.07)
Basophils Absolute: 0 10*3/uL (ref 0.0–0.1)
Basophils Relative: 0 %
Eosinophils Absolute: 0.5 10*3/uL (ref 0.0–0.5)
Eosinophils Relative: 7 %
HCT: 35.8 % — ABNORMAL LOW (ref 36.0–46.0)
Hemoglobin: 11 g/dL — ABNORMAL LOW (ref 12.0–15.0)
Immature Granulocytes: 1 %
Lymphocytes Relative: 39 %
Lymphs Abs: 2.8 10*3/uL (ref 0.7–4.0)
MCH: 29.7 pg (ref 26.0–34.0)
MCHC: 30.7 g/dL (ref 30.0–36.0)
MCV: 96.8 fL (ref 80.0–100.0)
Monocytes Absolute: 0.5 10*3/uL (ref 0.1–1.0)
Monocytes Relative: 7 %
Neutro Abs: 3.3 10*3/uL (ref 1.7–7.7)
Neutrophils Relative %: 46 %
Platelets: 235 10*3/uL (ref 150–400)
RBC: 3.7 MIL/uL — ABNORMAL LOW (ref 3.87–5.11)
RDW: 13.2 % (ref 11.5–15.5)
WBC: 7.2 10*3/uL (ref 4.0–10.5)
nRBC: 0 % (ref 0.0–0.2)

## 2020-02-15 LAB — COMPREHENSIVE METABOLIC PANEL
ALT: 20 U/L (ref 0–44)
AST: 27 U/L (ref 15–41)
Albumin: 3 g/dL — ABNORMAL LOW (ref 3.5–5.0)
Alkaline Phosphatase: 130 U/L — ABNORMAL HIGH (ref 38–126)
Anion gap: 12 (ref 5–15)
BUN: 7 mg/dL — ABNORMAL LOW (ref 8–23)
CO2: 24 mmol/L (ref 22–32)
Calcium: 8.7 mg/dL — ABNORMAL LOW (ref 8.9–10.3)
Chloride: 106 mmol/L (ref 98–111)
Creatinine, Ser: 0.68 mg/dL (ref 0.44–1.00)
GFR calc Af Amer: >60 mL/min (ref >60)
GFR calc non Af Amer: >60 mL/min (ref >60)
Glucose, Bld: 108 mg/dL — ABNORMAL HIGH (ref 70–99)
Potassium: 4 mmol/L (ref 3.5–5.1)
Sodium: 142 mmol/L (ref 135–145)
Total Bilirubin: 0.3 mg/dL (ref 0.3–1.2)
Total Protein: 6.4 g/dL — ABNORMAL LOW (ref 6.5–8.1)

## 2020-02-15 LAB — MAGNESIUM: Magnesium: 1.7 mg/dL (ref 1.7–2.4)

## 2020-02-15 LAB — PHOSPHORUS: Phosphorus: 2.2 mg/dL — ABNORMAL LOW (ref 2.5–4.6)

## 2020-02-15 MED ORDER — PSYLLIUM 95 % PO PACK
1.0000 | PACK | Freq: Every day | ORAL | Status: DC
  Filled 2020-02-15 (×3): qty 1

## 2020-02-15 MED ORDER — DIPHENOXYLATE-ATROPINE 2.5-0.025 MG/5ML PO LIQD: 5.0000 mL | Freq: Four times a day (QID) | ORAL | Status: DC | PRN

## 2020-02-15 MED ORDER — NITROFURANTOIN MONOHYD MACRO 100 MG PO CAPS
100.0000 mg | ORAL_CAPSULE | Freq: Every day | ORAL | Status: DC
  Filled 2020-02-15: qty 1

## 2020-02-15 MED ORDER — DIPHENHYDRAMINE HCL 25 MG PO CAPS
25.0000 mg | ORAL_CAPSULE | Freq: Three times a day (TID) | ORAL | Status: DC | PRN
  Administered 2020-02-15 – 2020-02-17 (×2): 25 mg via ORAL
  Filled 2020-02-15 (×3): qty 1

## 2020-02-15 MED ORDER — OXYCODONE-ACETAMINOPHEN 7.5-325 MG PO TABS
1.0000 | ORAL_TABLET | Freq: Four times a day (QID) | ORAL | Status: DC | PRN
  Administered 2020-02-15 – 2020-02-17 (×2): 1 via ORAL
  Filled 2020-02-15 (×3): qty 1

## 2020-02-15 NOTE — Progress Notes (Signed)
Patient had episode of incontinence. Bladder scan completed 325 cc. Second In & out cath completed 400 cc.

## 2020-02-15 NOTE — Progress Notes (Signed)
Patient complained of pain in her lower back and buttocks 7/10. Pt/ spouse reports pt having had percocet previously for pain management. Hospitalist provider contacted to assist.

## 2020-02-15 NOTE — Progress Notes (Signed)
Physical Therapy Treatment Patient Details Name: Katelyn Lamb MRN: [id number] DOB: 1945/11/07 Today's Date: 02/15/2020    History of Present Illness 74 y.o. female with a history of herpes zoster with prior encephalitis, thrombocytopenia, GERD, anemia, prior DVT, transverse myelitis, recurrent UTI, chronic pain, memory difficulty, and sepsis who presents to the emergency department via EMS for altered mental status and fever. Pt with sepsis secondary to UTI, RLL PE on lovenox.    PT Comments    Pt assisted with ambulating short distance in hallway with recliner following for safety.  Pt requiring multimodal cues and step by step instructions for mobilizing.  Pt anticipates d/c home tomorrow.  Spouse present and observed session.    Follow Up Recommendations  Home health PT;Supervision/Assistance - 24 hour     Equipment Recommendations  None recommended by PT    Recommendations for Other Services       Precautions / Restrictions Precautions Precautions: Fall    Mobility  Bed Mobility Overal bed mobility: Needs Assistance Bed Mobility: Supine to Sit     Supine to sit: Min assist;HOB elevated     General bed mobility comments: pt requiring assist to initiate however once provided with instructions, pt able to perform with increased time  Transfers Overall transfer level: Needs assistance Equipment used: Rolling walker (2 wheeled) Transfers: Sit to/from Stand Sit to Stand: Min assist         General transfer comment: multimodal cues for tehcnique, assist to rise and steady  Ambulation/Gait Ambulation/Gait assistance: Min assist;Min guard Gait Distance (Feet): 55 Feet Assistive device: Rolling walker (2 wheeled) Gait Pattern/deviations: Step-through pattern;Decreased stride length Gait velocity: decreased   General Gait Details: initially min assist for stability; improved to min/guard; recliner following for safety however pt did not require   Stairs              Wheelchair Mobility    Modified Rankin (Stroke Patients Only)       Balance Overall balance assessment: Needs assistance         Standing balance support: Bilateral upper extremity supported Standing balance-Leahy Scale: Poor Standing balance comment: reliant on UE support                            Cognition Arousal/Alertness: Awake/alert Behavior During Therapy: WFL for tasks assessed/performed Overall Cognitive Status: History of cognitive impairments - at baseline (history of cognitive impairment) Area of Impairment: Safety/judgement;Awareness;Problem solving                   Current Attention Level: Selective   Following Commands: Follows one step commands with increased time Safety/Judgement: Decreased awareness of safety;Decreased awareness of deficits Awareness: Emergent Problem Solving: Slow processing;Decreased initiation;Requires verbal cues;Difficulty sequencing;Requires tactile cues General Comments: required verbal and tactile cues for sequencing for all tasks      Exercises      General Comments        Pertinent Vitals/Pain Pain Assessment: Faces Faces Pain Scale: Hurts little more Pain Location: lower back and buttocks Pain Descriptors / Indicators: Grimacing;Sore Pain Intervention(s): Repositioned;Monitored during session    Home Living                      Prior Function            PT Goals (current goals can now be found in the care plan section) Progress towards PT goals: Progressing toward goals    Frequency  Min 3X/week      PT Plan Current plan remains appropriate    Co-evaluation              AM-PAC PT "6 Clicks" Mobility   Outcome Measure  Help needed turning from your back to your side while in a flat bed without using bedrails?: A Little Help needed moving from lying on your back to sitting on the side of a flat bed without using bedrails?: A Little Help needed moving to  and from a bed to a chair (including a wheelchair)?: A Little Help needed standing up from a chair using your arms (e.g., wheelchair or bedside chair)?: A Little Help needed to walk in hospital room?: A Little Help needed climbing 3-5 steps with a railing? : A Lot 6 Click Score: 17    End of Session Equipment Utilized During Treatment: Gait belt Activity Tolerance: Patient tolerated treatment well Patient left: in chair;with chair alarm set;with call bell/phone within reach;with family/visitor present Nurse Communication: Mobility status PT Visit Diagnosis: Muscle weakness (generalized) (M62.81);Difficulty in walking, not elsewhere classified (R26.2)     Time: 4503-8882 PT Time Calculation (min) (ACUTE ONLY): 23 min  Charges:  $Gait Training: 8-22 mins                    Arlyce Dice, DPT Acute Rehabilitation Services Pager: [phone] Office: [phone]  Trena Platt 02/15/2020, 4:13 PM

## 2020-02-15 NOTE — Care Management Important Message (Signed)
Important Message  Patient Details IM Letter given to Gabriel Earing RN Case Manager to present to the Patient Name: Katelyn Lamb MRN: [id number] Date of Birth: 02/24/1946   Medicare Important Message Given:  Yes     [name] 02/15/2020, 11:56 AM

## 2020-02-15 NOTE — Progress Notes (Addendum)
Patient had an incontinent episode this am.Bladder scan completed, 569 cc. Pt attempted to use bedside commode. Pt unsuccessful. In and out cath performed, 700 cc.

## 2020-02-15 NOTE — Progress Notes (Signed)
PROGRESS NOTE    Katelyn Lamb  YME:[id number] DOB: [birth year] DOA: 02/12/2020 PCP: [name], MD   Brief Narrative:   Katelyn Lamb is a 74 y.o. who presents to the emergency department via EMS for altered mental status and fever. Sxs began at midnight.Per EMS they received a call from patient's husband whom she lives with at home that the patient was altered and felt hot to the touch. He described her altered mental status as speaking nonsensically about things like raccoons. Upon EMS arrival patient was febrile to 102,she was tachycardic, and noted to be hypoxic in the mid 80s on room air improved with application of 15 L nonrebreather. Patient reports to me that she has had a cough, she has no other specific complaints but does not necessarilyconsistently answer questions. She denies headache, chest pain, dyspnea, abdominal pain, dysuria, melena, or hematochezia.Level 5 caveat applies secondary to altered mental status. EMS administered 1L NS en route.Patient's husband is reported to be en route to the hospital.Patient has had both doses of her COVID 19 vaccine.  Husband states that patient has been having frequent recurrent UTIs. Has been working with Dr. [name], ID to try and identify organism causing these frequent infections. Stated had been sent home on a 10-day course of antibiotics via PICC line, patient did well until IV antibiotics discontinued and then infection returned. Not on home O2. A/O x4, follows all commands. Positive S OB, negative CP, positive back pain  6/14: Merrem through tomorrow night. Voiding trial in progress. She is having some problems with retention. Per protocol, I&O cath for an another 24 hours until resolution or need to replace foley. She complains of diarrhea this AM. Will add lomotil. Continue xarelto. PT eval ordered.     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 ID who recommended Macrobid x30 days for chronic suppression. Follow-up with Dr. [name] in 2 to 3 weeks. - We will ensure ID instructions follow once patient stable for discharge. - 6/12: remains on merrem this morning, WBC ok. temp curve improving, now off pressors, UCx w/ multiple colonies present. Will order recollection, but she's been on abx, so not likely to produce anything. Continue merrem for now.     - 6/13: Rpt UCx w/ no growth. We will extend her merrem coverage to 5 days and then continue her suppressive macrobid. TTF.      - 6/14: Merrem through tomorrow night. [place]    Neurogenic bladder - Patient's MAR does not contain medication  for neurogenic bladder - Bladder scan TID; checking for complete emptying of bladder - If patient having significant urine retention may be contributing to her frequent UTI we will add medication. - on flomax, but still retaining, place foley; attempt voiding trial as early as possible     - 6/14: Per protocol, she is supposed to  try I&O cathing for 24hours before she can return to a foley; should she need replacement of foley, she will discharge to home with it and follow up with urology outpt.   Acute respiratory failure with hypoxia Acute RIGHT lower lobe PE - Patient does not use home O2 - Most likely secondary to acute RIGHT PE - Titrate O2 to maintain SPO2>92% - CTA PE protocol consistent with acute PE see results above - Full dose Lovenox per pharmacy - lower extremity Doppler: negative - 6/13: transition to xarelto, continue IS, flutter, wean O2 as able     - 6/14: Now on RA; continue xarelto.  Incomplete paraplegia - Stable  Herpes zoster without complication - Lyrica 644 mg nightly  Generalized anxiety disorder Reactive depression - Cymbalta 30 mg every morning/60 mg nightly  Essential HTN - BP meds held d/t to her need for pressors - off pressors, but pressure is soft, continue holding home meds for now     - 6/14: BP soft. Continue holding home meds.   Goals of care - PT/OT rec HH; consult TOC  DVT prophylaxis: xarelto Code Status: FULL  Status is: Inpatient  Remains inpatient appropriate because:Inpatient level of care appropriate due to severity of illness   Dispo: The patient is from: Home              Anticipated d/c is to: Home              Anticipated d/c date is: 2 days              Patient currently is not medically stable to d/c.  Consultants:   PCCM  Antimicrobials:  Marland Kitchen Merrem   ROS:  Reports diarrhea . Remainder 10-pt ROS is negative for all not previously mentioned.  Subjective: "They just aren't coming in fast enough."  Objective: Vitals:   02/14/20 1814 02/14/20 2031 02/15/20 0013 02/15/20 0500  BP: 107/72 102/71 106/83 109/83  Pulse: 79 85 86 80  Resp: (!) '24 16 14 16  ' Temp:  98.6 F (37 C) 99.1 F (37.3 C) 99.4 F (37.4 C)  TempSrc:  Oral Oral Oral  SpO2: 97% 95% 93% 97%  Weight:        Height:        Intake/Output Summary (Last 24 hours) at 02/15/2020 0740 Last data filed at 02/15/2020 0347 Gross per 24 hour  Intake 3293.59 ml  Output 3500 ml  Net -206.41 ml   Filed Weights   02/12/20 0511 02/12/20 0944  Weight: 73.2 kg 72.7 kg    Examination:  General: 74 y.o. female resting in bed in NAD Cardiovascular: RRR, +S1, S2, no m/g/r, equal pulses throughout Respiratory: CTABL, no w/r/r, normal WOB GI: BS+, NDNT, no masses noted, no organomegaly noted MSK: No e/c/c Neuro: Alert to name, follows commands Psyc: Appropriate interaction and affect, calm/cooperative   Data Reviewed: I have personally reviewed following labs and imaging studies.  CBC: Recent Labs  Lab 02/12/20 0520 02/13/20 0538 02/14/20 0244 02/15/20 0415  WBC 12.2* 14.7* 8.9 7.2  NEUTROABS 10.0* 12.2* 4.9 3.3  HGB 11.5* 10.3* 9.5* 11.0*  HCT 37.2 32.5* 30.4* 35.8*  MCV 97.9 95.6 97.7 96.8  PLT 249 220 195 034   Basic Metabolic Panel: Recent Labs  Lab 02/12/20 0520 02/13/20 0538 02/14/20 0244 02/15/20 0415  NA 138 138 142 142  K 3.7 4.2 4.0 4.0  CL 101 105 110 106  CO2 '26 23 26 24  ' GLUCOSE 126* 105* 110* 108*  BUN '10 8 8 ' 7*  CREATININE 1.05* 0.76 0.74 0.68  CALCIUM 8.4* 8.7* 8.3* 8.7*  MG  --  1.7 1.8 1.7  PHOS  --  2.4* 2.5 2.2*   GFR: Estimated Creatinine Clearance: 63.5 mL/min (by C-G formula based on SCr of 0.68 mg/dL). Liver Function Tests: Recent Labs  Lab 02/12/20 0520 02/13/20 0538 02/14/20 0244 02/15/20 0415  AST 49* 42* 22 27  ALT '26 25 20 20  ' ALKPHOS 160* 132* 113 130*  BILITOT 0.6 1.0 0.5 0.3  PROT 6.9 6.2* 5.6* 6.4*  ALBUMIN 3.6 3.1* 2.6* 3.0*   No results for input(s): LIPASE, AMYLASE in the last 168 hours. No results for input(s): AMMONIA in the last 168 hours. Coagulation Profile: Recent Labs  Lab 02/12/20 0520 02/13/20 0538  INR 1.1 1.1   Cardiac Enzymes: No results for input(s): CKTOTAL, CKMB, CKMBINDEX, TROPONINI in the last 168  hours. BNP (last 3 results) No results for input(s): PROBNP in the last 8760 hours. HbA1C: No results for input(s): HGBA1C in the last 72 hours. CBG: No results for input(s): GLUCAP in the last 168 hours. Lipid Profile: No results for input(s): CHOL, HDL, LDLCALC, TRIG, CHOLHDL, LDLDIRECT in the last 72 hours. Thyroid Function Tests: No results for input(s): TSH, T4TOTAL, FREET4, T3FREE, THYROIDAB in the last 72 hours. Anemia Panel: No results for input(s): VITAMINB12, FOLATE, FERRITIN, TIBC, IRON, RETICCTPCT in the last 72 hours. Sepsis Labs: Recent Labs  Lab 02/13/20 0204 02/13/20 0538 02/13/20 0737 02/13/20 1045  PROCALCITON  --  1.74  --   --   LATICACIDVEN 2.1* 1.3 1.1 1.4    Recent Results (from the past 240 hour(s))  SARS Coronavirus 2 by RT PCR (hospital order, performed in [facility] hospital lab) Nasopharyngeal Nasopharyngeal Swab     Status: None   Collection Time: 02/06/20 11:03 AM   Specimen: Nasopharyngeal Swab  Result Value Ref Range Status   SARS Coronavirus 2 NEGATIVE NEGATIVE Final    Comment: (NOTE) SARS-CoV-2 target nucleic acids are NOT DETECTED. The SARS-CoV-2 RNA is generally detectable in upper and lower respiratory specimens during the acute phase of infection. The lowest concentration of SARS-CoV-2 viral copies this assay can detect is 250 copies / mL. A negative result does not preclude SARS-CoV-2 infection and should not be used as the sole basis for treatment or other patient management decisions.  A negative result may occur with improper specimen collection / handling, submission of specimen other than nasopharyngeal swab, presence of viral mutation(s) within the areas targeted by this assay, and inadequate number of viral copies (<250 copies / mL). A negative result must be combined with clinical observations, patient history, and epidemiological information. Fact Sheet for Patients:   StrictlyIdeas.no Fact Sheet  for Healthcare Providers: BankingDealers.co.za This test is not yet approved or cleared  by the [place] FDA and has been authorized for detection and/or diagnosis of SARS-CoV-2 by FDA under an Emergency Use Authorization (EUA).  This EUA will remain in effect (meaning this test can be used) for the duration of the COVID-19 declaration under Section 564(b)(1) of the Act, 21 U.S.C. section 360bbb-3(b)(1), unless the authorization is terminated or  revoked sooner. Performed at [facility], [place] [address], [place], [place] [address]   Blood Culture (routine x 2)     Status: None (Preliminary result)   Collection Time: 02/12/20  5:21 AM   Specimen: BLOOD  Result Value Ref Range Status   Specimen Description   Final    BLOOD RIGHT ANTECUBITAL Performed at [facility] [address] [place], [place] [address]    Special Requests   Final    BOTTLES DRAWN AEROBIC AND ANAEROBIC Blood Culture adequate volume Performed at [place] [address], [place], [place] [address]    Culture   Final    NO GROWTH 2 DAYS Performed at [place] [address], [place], [place] [address]    Report Status PENDING  Incomplete  Blood Culture (routine x 2)     Status: None (Preliminary result)   Collection Time: 02/12/20  5:21 AM   Specimen: BLOOD  Result Value Ref Range Status   Specimen Description   Final    BLOOD BLOOD LEFT FOREARM Performed at [facility] [address], [place], [place] [address]    Special Requests   Final    BOTTLES DRAWN AEROBIC AND ANAEROBIC Blood Culture adequate volume Performed at [facility] [address], [place], [place] [address]    Culture   Final    NO GROWTH 2 DAYS Performed at [place] [address] [place], [place] [address]    Report Status PENDING  Incomplete  Urine culture     Status: Abnormal   Collection Time:  02/12/20  5:48 AM   Specimen: In/Out Cath Urine  Result Value Ref Range Status   Specimen Description   Final    IN/OUT CATH URINE Performed at [place] [address] [place], [place] [address]    Special Requests   Final    NONE Performed at [facility], [place] [address] [place], [place] [address]    Culture (A)  Final    40,000 COLONIES/mL MULTIPLE SPECIES PRESENT, SUGGEST RECOLLECTION   Report Status 02/13/2020 FINAL  Final  SARS Coronavirus 2 by RT PCR (hospital order, performed in [facility] hospital lab) Nasopharyngeal Nasopharyngeal Swab     Status: None   Collection Time: 02/12/20  5:48 AM   Specimen: Nasopharyngeal Swab  Result Value Ref Range Status   SARS Coronavirus 2 NEGATIVE NEGATIVE Final    Comment: (NOTE) SARS-CoV-2 target nucleic acids are NOT DETECTED.  The SARS-CoV-2 RNA is generally detectable in upper and lower respiratory specimens during the acute phase of infection. The lowest concentration of SARS-CoV-2 viral copies this assay can detect is 250 copies / mL. A negative result does not preclude SARS-CoV-2 infection and should not be used as the sole basis for treatment or other patient management decisions.  A negative result may occur with improper specimen collection / handling, submission of specimen other than nasopharyngeal swab, presence of viral mutation(s) within the areas targeted by this assay, and inadequate number of viral copies (<250 copies / mL). A negative result must be combined with clinical observations, patient history, and epidemiological information.  Fact Sheet for Patients:   StrictlyIdeas.no  Fact Sheet for Healthcare Providers: BankingDealers.co.za  This test is not yet approved or  cleared by the [place] FDA and has been authorized for detection and/or diagnosis of SARS-CoV-2 by FDA under an Emergency Use Authorization (EUA).  This EUA will remain in effect (meaning this test can be used) for the duration of the COVID-19 declaration under Section 564(b)(1) of the Act, 21 U.S.C. section 360bbb-3(b)(1), unless the authorization is terminated or revoked sooner.  Performed at [facility], [place] [address], [place], [place] [address]   MRSA PCR Screening     Status: None   Collection Time: 02/12/20  9:51 AM   Specimen: Nasal Mucosa; Nasopharyngeal  Result Value Ref Range Status   MRSA by PCR NEGATIVE NEGATIVE Final    Comment:        The GeneXpert MRSA Assay (FDA approved for NASAL specimens only), is one component of a comprehensive MRSA colonization surveillance program. It is not intended to diagnose MRSA infection nor to guide or monitor treatment for MRSA infections. Performed at [facility], [place] [address], [place], [place] [address]   Culture, Urine     Status: None   Collection Time: 02/13/20  2:27 PM   Specimen: Urine, Random  Result Value Ref Range Status   Specimen Description   Final    URINE, RANDOM Performed at [place] [address] [place], [place] [address]    Special Requests   Final    NONE Performed at [facility], [place] [address], [place], [place] [address]    Culture   Final    NO GROWTH Performed at [facility], [place] [address] [place], [place] [address]    Report Status 02/14/2020 FINAL  Final      Radiology Studies: DG CHEST PORT 1 VIEW  Result Date: 02/13/2020 CLINICAL DATA:  Wheezing. EXAM: PORTABLE CHEST 1 VIEW COMPARISON:  Radiograph and chest CTA yesterday. FINDINGS: Normal heart size with unchanged mediastinal contours. Trace blunting of the costophrenic angles, new from prior may represent small effusions. No focal airspace disease, pulmonary edema, or pneumothorax. Stable osseous structures. IMPRESSION: Trace blunting of the costophrenic angles, new from prior may represent  small effusions. Otherwise stable exam without acute findings. Electronically Signed   By: Keith Rake M.D.   On: 02/13/2020 18:00     Scheduled Meds: . Chlorhexidine Gluconate Cloth  6 each Topical Daily  . DULoxetine  30 mg Oral q morning - 10a  . DULoxetine  60 mg Oral QHS  . mouth rinse  15 mL Mouth Rinse BID  . omega-3 acid ethyl esters  1 g Oral Daily  . pantoprazole  40 mg Oral QHS  . pregabalin  100 mg Oral QHS  . Rivaroxaban  15 mg Oral BID WC   Followed by  . [START ON 03/06/2020] rivaroxaban  20 mg Oral Q supper  . tamsulosin  0.4 mg Oral Daily   Continuous Infusions: . sodium chloride 50 mL/hr at 02/14/20 1600  . sodium chloride 10 mL/hr at 02/12/20 1800  . sodium chloride    . meropenem (MERREM) IV 1 g (02/14/20 2318)     LOS: 3 days    Time spent: 25 minutes spent in the coordination of care today.    Jonnie Finner, DO [facility]  If 7PM-7AM, please contact night-coverage [email or web address] 02/15/2020, 7:40 AM

## 2020-02-15 NOTE — TOC Progression Note (Signed)
Transition of Care [facility]) - Progression Note    Patient Details  Name: Katelyn Lamb MRN: [id number] Date of Birth: 11/02/1945  Transition of Care [facility] CM/SW Contact  Purcell Mouton, RN Phone Number: 02/15/2020, 4:25 PM  Clinical Narrative:     Spoke with pt's husband concerning discharge plans and needs. Husband asked for [facility]. Referral given to in house rep.        Expected Discharge Plan and Services                                                 Social Determinants of Health (SDOH) Interventions    Readmission Risk Interventions No flowsheet data found.

## 2020-02-15 NOTE — Progress Notes (Signed)
Patients spouse at bedside and expressed concerns regarding tissue injury to right heel. Spouse request site be evaluated further for treatment recommendations. WOC consult entered.

## 2020-02-15 NOTE — Discharge Instructions (Addendum)
Indwelling Urinary Catheter Care, Adult °An indwelling urinary catheter is a thin tube that is put into your bladder. The tube helps to drain pee (urine) out of your body. The tube goes in through your urethra. Your urethra is where pee comes out of your body. Your pee will come out through the catheter, then it will go into a bag (drainage bag). °Take good care of your catheter so it will work well. °How to wear your catheter and bag °Supplies needed °· Sticky tape (adhesive tape) or a leg strap. °· Alcohol wipe or soap and water (if you use tape). °· A clean towel (if you use tape). °· Large overnight bag. °· Smaller bag (leg bag). °Wearing your catheter °Attach your catheter to your leg with tape or a leg strap. °· Make sure the catheter is not pulled tight. °· If a leg strap gets wet, take it off and put on a dry strap. °· If you use tape to hold the bag on your leg: °1. Use an alcohol wipe or soap and water to wash your skin where the tape made it sticky before. °2. Use a clean towel to pat-dry that skin. °3. Use new tape to make the bag stay on your leg. °Wearing your bags °You should have been given a large overnight bag. °· You may wear the overnight bag in the day or night. °· Always have the overnight bag lower than your bladder.  Do not let the bag touch the floor. °· Before you go to sleep, put a clean plastic bag in a wastebasket. Then hang the overnight bag inside the wastebasket. °You should also have a smaller leg bag that fits under your clothes. °· Always wear the leg bag below your knee. °· Do not wear your leg bag at night. °How to care for your skin and catheter °Supplies needed °· A clean washcloth. °· Water and mild soap. °· A clean towel. °Caring for your skin and catheter ° °  ° °· Clean the skin around your catheter every day: °1. Wash your hands with soap and water. °2. Wet a clean washcloth in warm water and mild soap. °3. Clean the skin around your urethra. °§ If you are  female: °§ Gently spread the folds of skin around your vagina (labia). °§ With the washcloth in your other hand, wipe the inner side of your labia on each side. Wipe from front to back. °§ If you are female: °§ Pull back any skin that covers the end of your penis (foreskin). °§ With the washcloth in your other hand, wipe your penis in small circles. Start wiping at the tip of your penis, then move away from the catheter. °§ Move the foreskin back in place, if needed. °4. With your free hand, hold the catheter close to where it goes into your body. °§ Keep holding the catheter during cleaning so it does not get pulled out. °5. With the washcloth in your other hand, clean the catheter. °§ Only wipe downward on the catheter. °§ Do not wipe upward toward your body. Doing this may push germs into your urethra and cause infection. °6. Use a clean towel to pat-dry the catheter and the skin around it. Make sure to wipe off all soap. °7. Wash your hands with soap and water. °· Shower every day. Do not take baths. °· Do not use cream, ointment, or lotion on the area where the catheter goes into your body, unless your doctor tells you   to. °· Do not use powders, sprays, or lotions on your genital area. °· Check your skin around the catheter every day for signs of infection. Check for: °? Redness, swelling, or pain. °? Fluid or blood. °? Warmth. °? Pus or a bad smell. °How to empty the bag °Supplies needed °· Rubbing alcohol. °· Gauze pad or cotton ball. °· Tape or a leg strap. °Emptying the bag °Pour the pee out of your bag when it is ?-½ full, or at least 2-3 times a day. Do this for your overnight bag and your leg bag. °1. Wash your hands with soap and water. °2. Separate (detach) the bag from your leg. °3. Hold the bag over the toilet or a clean pail. Keep the bag lower than your hips and bladder. This is so the pee (urine) does not go back into the tube. °4. Open the pour spout. It is at the bottom of the bag. °5. Empty the  pee into the toilet or pail. Do not let the pour spout touch any surface. °6. Put rubbing alcohol on a gauze pad or cotton ball. °7. Use the gauze pad or cotton ball to clean the pour spout. °8. Close the pour spout. °9. Attach the bag to your leg with tape or a leg strap. °10. Wash your hands with soap and water. °Follow instructions for cleaning the drainage bag: °· From the product maker. °· As told by your doctor. °How to change the bag °Supplies needed °· Alcohol wipes. °· A clean bag. °· Tape or a leg strap. °Changing the bag °Replace your bag when it starts to leak, smell bad, or look dirty. °1. Wash your hands with soap and water. °2. Separate the dirty bag from your leg. °3. Pinch the catheter with your fingers so that pee does not spill out. °4. Separate the catheter tube from the bag tube where these tubes connect (at the connection valve). Do not let the tubes touch any surface. °5. Clean the end of the catheter tube with an alcohol wipe. Use a different alcohol wipe to clean the end of the bag tube. °6. Connect the catheter tube to the tube of the clean bag. °7. Attach the clean bag to your leg with tape or a leg strap. Do not make the bag tight on your leg. °8. Wash your hands with soap and water. °General rules ° °· Never pull on your catheter. Never try to take it out. Doing that can hurt you. °· Always wash your hands before and after you touch your catheter or bag. Use a mild, fragrance-free soap. If you do not have soap and water, use hand sanitizer. °· Always make sure there are no twists or bends (kinks) in the catheter tube. °· Always make sure there are no leaks in the catheter or bag. °· Drink enough fluid to keep your pee pale yellow. °· Do not take baths, swim, or use a hot tub. °· If you are female, wipe from front to back after you poop (have a bowel movement). °Contact a doctor if: °· Your pee is cloudy. °· Your pee smells worse than usual. °· Your catheter gets clogged. °· Your catheter  leaks. °· Your bladder feels full. °Get help right away if: °· You have redness, swelling, or pain where the catheter goes into your body. °· You have fluid, blood, pus, or a bad smell coming from the area where the catheter goes into your body. °· Your skin feels warm where   the catheter goes into your body.  You have a fever.  You have pain in your: ? Belly (abdomen). ? Legs. ? Lower back. ? Bladder.  You see blood in the catheter.  Your pee is pink or red.  You feel sick to your stomach (nauseous).  You throw up (vomit).  You have chills.  Your pee is not draining into the bag.  Your catheter gets pulled out. Summary  An indwelling urinary catheter is a thin tube that is placed into the bladder to help drain pee (urine) out of the body.  The catheter is placed into the part of the body that drains pee from the bladder (urethra).  Taking good care of your catheter will keep it working properly and help prevent problems.  Always wash your hands before and after touching your catheter or bag.  Never pull on your catheter or try to take it out. This information is not intended to replace advice given to you by your health care provider. Make sure you discuss any questions you have with your health care provider. Document Revised: 12/12/2018 Document Reviewed: 04/05/2017 Elsevier Patient Education  [place] on my medicine - XARELTO (rivaroxaban)  This medication education was reviewed with me or my healthcare representative as part of my discharge preparation.  WHY WAS XARELTO PRESCRIBED FOR YOU? Xarelto was prescribed to treat blood clots that may have been found in the veins of your legs (deep vein thrombosis) or in your lungs (pulmonary embolism) and to reduce the risk of them occurring again.  What do you need to know about Xarelto? The starting dose is one 15 mg tablet taken TWICE daily with food for the FIRST 21 DAYS then after all the 15 mg  tablets have been taken  the dose is changed to one 20 mg tablet taken ONCE A DAY with your evening meal.  DO NOT stop taking Xarelto without talking to the health care provider who prescribed the medication.  Refill your prescription for 20 mg tablets before you run out.  After discharge, you should have regular check-up appointments with your healthcare provider that is prescribing your Xarelto.  In the future your dose may need to be changed if your kidney function changes by a significant amount.  What do you do if you miss a dose? If you are taking Xarelto TWICE DAILY and you miss a dose, take it as soon as you remember. You may take two 15 mg tablets (total 30 mg) at the same time then resume your regularly scheduled 15 mg twice daily the next day.  If you are taking Xarelto ONCE DAILY and you miss a dose, take it as soon as you remember on the same day then continue your regularly scheduled once daily regimen the next day. Do not take two doses of Xarelto at the same time.   Important Safety Information Xarelto is a blood thinner medicine that can cause bleeding. You should call your healthcare provider right away if you experience any of the following: ? Bleeding from an injury or your nose that does not stop. ? Unusual colored urine (red or dark brown) or unusual colored stools (red or black). ? Unusual bruising for unknown reasons. ? A serious fall or if you hit your head (even if there is no bleeding).  Some medicines may interact with Xarelto and might increase your risk of bleeding while on Xarelto. To help avoid this, consult your healthcare provider or pharmacist prior to using  any new prescription or non-prescription medications, including herbals, vitamins, non-steroidal anti-inflammatory drugs (NSAIDs) and supplements.  This website has more information on Xarelto: [email or web address].[email or web address]    Sepsis, Diagnosis, Adult Sepsis is a serious bodily reaction to an infection. The  infection that triggers sepsis may be from a bacteria, virus, or fungus. Sepsis can result from an infection in any part of your body. Infections that commonly lead to sepsis include skin, lung, and urinary tract infections. Sepsis is a medical emergency that must be treated right away in a hospital. In severe cases, it can lead to septic shock. Septic shock can weaken your heart and cause your blood pressure to drop. This can cause your central nervous system and your body's organs to stop working. What are the causes? This condition is caused by a severe reaction to infections from bacteria, viruses, or fungus. The germs that most often lead to sepsis include:  Escherichia coli (E. coli) bacteria.  Staphylococcus aureus (staph) bacteria.  Some types of Streptococcus bacteria. The most common infections affect these organs:  The lung (pneumonia).  The kidneys or bladder (urinary tract infection).  The skin (cellulitis).  The bowel, gallbladder, or pancreas. What increases the risk? You are more likely to develop this condition if:  Your body's disease-fighting system (immune system) is weakened.  You are age 62 or older.  You are female.  You had surgery or you have been hospitalized.  You have these devices inserted into your body: ? A small, thin tube (catheter). ? IV line. ? Breathing tube. ? Drainage tube.  You are not getting enough nutrients from food (malnourished).  You have a long-term (chronic) disease, such as cancer, lung disease, kidney disease, or diabetes.  You are African American. What are the signs or symptoms? Symptoms of this condition may include:  Fever.  Chills or feeling very cold.  Confusion or anxiety.  Fatigue.  Muscle aches.  Shortness of breath.  Nausea and vomiting.  Urinating much less than usual.  Fast heart rate (tachycardia).  Rapid breathing (hyperventilation).  Changes in skin color. Your skin may look blotchy, pale,  or blue.  Cool, clammy, or sweaty skin.  Skin rash. Other symptoms depend on the source of your infection. How is this diagnosed? This condition is diagnosed based on:  Your symptoms.  Your medical history.  A physical exam. Other tests may also be done to find out the cause of the infection and how severe the sepsis is. These tests may include:  Blood tests.  Urine tests.  Swabs from other areas of your body that may have an infection. These samples may be tested (cultured) to find out what type of bacteria is causing the infection.  Chest X-ray to check for pneumonia. Other imaging tests, such as a CT scan, may also be done.  Lumbar puncture. This removes a small amount of the fluid that surrounds your brain and spinal cord. The fluid is then examined for infection. How is this treated? This condition must be treated in a hospital. Based on the cause of your infection, you may be given an antibiotic, antiviral, or antifungal medicine. You may also receive:  Fluids through an IV.  Oxygen and breathing assistance.  Medicines to increase your blood pressure.  Kidney dialysis. This process cleans your blood if your kidneys have failed.  Surgery to remove infected tissue.  Blood transfusion if needed.  Medicine to prevent blood clots.  Nutrients to correct imbalances in basic body function (metabolism). You may: ?  Receive important salts and minerals (electrolytes) through an IV. ? Have your blood sugar level adjusted. Follow these instructions at home: Medicines   Take over-the-counter and prescription medicines only as told by your health care provider.  If you were prescribed an antibiotic, antiviral, or antifungal medicine, take it as told by your health care provider. Do not stop taking the medicine even if you start to feel better. General instructions  If you have a catheter or other indwelling device, ask to have it removed as soon as possible.  Keep all  follow-up visits as told by your health care provider. This is important. Contact a health care provider if:  You do not feel like you are getting better or regaining strength.  You are having trouble coping with your recovery.  You frequently feel tired.  You feel worse or do not seem to get better after surgery.  You think you may have an infection after surgery. Get help right away if:  You have any symptoms of sepsis.  You have difficulty breathing.  You have a rapid or skipping heartbeat.  You become confused or disoriented.  You have a high fever.  Your skin becomes blotchy, pale, or blue.  You have an infection that is getting worse or not getting better. These symptoms may represent a serious problem that is an emergency. Do not wait to see if the symptoms will go away. Get medical help right away. Call your local emergency services (911 in the [place] Do not drive yourself to the hospital. Summary  Sepsis is a medical emergency that requires immediate treatment in a hospital.  This condition is caused by a severe reaction to infections from bacteria, viruses, or fungus.  Based on the cause of your infection, you may be given an antibiotic, antiviral, or antifungal medicine.  Treatment may also include IV fluids, breathing assistance, and kidney dialysis. This information is not intended to replace advice given to you by your health care provider. Make sure you discuss any questions you have with your health care provider. Document Revised: 03/28/2018 Document Reviewed: 03/28/2018 Elsevier Patient Education  [place].

## 2020-02-16 LAB — CBC WITH DIFFERENTIAL/PLATELET
Abs Immature Granulocytes: 0.03 10*3/uL (ref 0.00–0.07)
Basophils Absolute: 0.1 10*3/uL (ref 0.0–0.1)
Basophils Relative: 1 %
Eosinophils Absolute: 0.6 10*3/uL — ABNORMAL HIGH (ref 0.0–0.5)
Eosinophils Relative: 8 %
HCT: 37.1 % (ref 36.0–46.0)
Hemoglobin: 11.8 g/dL — ABNORMAL LOW (ref 12.0–15.0)
Immature Granulocytes: 0 %
Lymphocytes Relative: 39 %
Lymphs Abs: 2.8 10*3/uL (ref 0.7–4.0)
MCH: 30 pg (ref 26.0–34.0)
MCHC: 31.8 g/dL (ref 30.0–36.0)
MCV: 94.4 fL (ref 80.0–100.0)
Monocytes Absolute: 0.5 10*3/uL (ref 0.1–1.0)
Monocytes Relative: 7 %
Neutro Abs: 3.3 10*3/uL (ref 1.7–7.7)
Neutrophils Relative %: 45 %
Platelets: 256 10*3/uL (ref 150–400)
RBC: 3.93 MIL/uL (ref 3.87–5.11)
RDW: 13.3 % (ref 11.5–15.5)
WBC: 7.3 10*3/uL (ref 4.0–10.5)
nRBC: 0 % (ref 0.0–0.2)

## 2020-02-16 LAB — COMPREHENSIVE METABOLIC PANEL
ALT: 19 U/L (ref 0–44)
AST: 29 U/L (ref 15–41)
Albumin: 3.1 g/dL — ABNORMAL LOW (ref 3.5–5.0)
Alkaline Phosphatase: 138 U/L — ABNORMAL HIGH (ref 38–126)
Anion gap: 11 (ref 5–15)
BUN: 8 mg/dL (ref 8–23)
CO2: 26 mmol/L (ref 22–32)
Calcium: 8.9 mg/dL (ref 8.9–10.3)
Chloride: 104 mmol/L (ref 98–111)
Creatinine, Ser: 0.72 mg/dL (ref 0.44–1.00)
GFR calc Af Amer: >60 mL/min (ref >60)
GFR calc non Af Amer: >60 mL/min (ref >60)
Glucose, Bld: 106 mg/dL — ABNORMAL HIGH (ref 70–99)
Potassium: 4.2 mmol/L (ref 3.5–5.1)
Sodium: 141 mmol/L (ref 135–145)
Total Bilirubin: 0.4 mg/dL (ref 0.3–1.2)
Total Protein: 6.7 g/dL (ref 6.5–8.1)

## 2020-02-16 LAB — MAGNESIUM: Magnesium: 1.8 mg/dL (ref 1.7–2.4)

## 2020-02-16 LAB — PHOSPHORUS: Phosphorus: 3.4 mg/dL (ref 2.5–4.6)

## 2020-02-16 MED ORDER — GERHARDT'S BUTT CREAM
TOPICAL_CREAM | Freq: Three times a day (TID) | CUTANEOUS | Status: DC
  Filled 2020-02-16: qty 1

## 2020-02-16 NOTE — Progress Notes (Signed)
Occupational Therapy Treatment Patient Details Name: Katelyn Lamb MRN: [id number] DOB: Mar 05, 1946 Today's Date: 02/16/2020    History of present illness 74 y.o. female with a history of herpes zoster with prior encephalitis, thrombocytopenia, GERD, anemia, prior DVT, transverse myelitis, recurrent UTI, chronic pain, memory difficulty, and sepsis who presents to the emergency department via EMS for altered mental status and fever. Pt with sepsis secondary to UTI, RLL PE on lovenox.   OT comments  Treatment focused on participation in ADLs and safe mobility. Patient required max assistance to donn socks but min assist for toileting and min guard for grooming standing at sink. Initially needed min assist for sit to stand but improved to min guard with RW.    Follow Up Recommendations  Home health OT;Supervision/Assistance - 24 hour    Equipment Recommendations  None recommended by OT    Recommendations for Other Services      Precautions / Restrictions Precautions Precautions: Fall       Mobility Bed Mobility   Bed Mobility: Supine to Sit     Supine to sit: Min assist;HOB elevated        Transfers Overall transfer level: Needs assistance Equipment used: Rolling walker (2 wheeled) Transfers: Sit to/from Stand Sit to Stand: Min assist         General transfer comment: Initially min assist with sit to stand but improved to min guard with third stand and after ambulation to bathroom.    Balance                                           ADL either performed or assessed with clinical judgement   ADL   Eating/Feeding: Set up;Sitting   Grooming: Standing;Oral care;Wash/dry face;Wash/dry hands Grooming Details (indicate cue type and reason): standing at sink.               Lower Body Dressing Details (indicate cue type and reason): unable to donn socks. Attempted twice - limited by weak grip strength and tremors in hands. Toilet Transfer: Min  guard;Ambulation;Regular Toilet;RW Toilet Transfer Details (indicate cue type and reason): Patient ambulated to bathroom with RW and min guard. verbal cue to not get caught up in IV line. Toileting- Clothing Manipulation and Hygiene: Minimal assistance Toileting - Clothing Manipulation Details (indicate cue type and reason): min assist for clothing managmeent with toileting.       General ADL Comments: Patient ambulated with min guard to bathroom, stood at sink for grooming, and ambulated to recliner for lunch. no overt loss of balance but poor posture and flexed knees noted.     Vision       Perception     Praxis      Cognition Arousal/Alertness: Awake/alert Behavior During Therapy: WFL for tasks assessed/performed Overall Cognitive Status: No family/caregiver present to determine baseline cognitive functioning                                          Exercises     Shoulder Instructions       General Comments      Pertinent Vitals/ Pain       Pain Assessment: No/denies pain  Home Living  Prior Functioning/Environment              Frequency  Min 2X/week        Progress Toward Goals  OT Goals(current goals can now be found in the care plan section)  Progress towards OT goals: Progressing toward goals  Acute Rehab OT Goals Patient Stated Goal: go home OT Goal Formulation: With patient Time For Goal Achievement: 02/27/20 Potential to Achieve Goals: Good  Plan Discharge plan remains appropriate    Co-evaluation                 AM-PAC OT "6 Clicks" Daily Activity     Outcome Measure   Help from another person eating meals?: A Little Help from another person taking care of personal grooming?: A Little Help from another person toileting, which includes using toliet, bedpan, or urinal?: A Little Help from another person bathing (including washing, rinsing, drying)?: A  Lot Help from another person to put on and taking off regular upper body clothing?: A Little Help from another person to put on and taking off regular lower body clothing?: A Lot 6 Click Score: 16    End of Session Equipment Utilized During Treatment: Gait belt;Rolling walker  OT Visit Diagnosis: Unsteadiness on feet (R26.81);Muscle weakness (generalized) (M62.81);Other symptoms and signs involving cognitive function   Activity Tolerance Patient tolerated treatment well   Patient Left in chair;with chair alarm set;with call bell/phone within reach   Nurse Communication Mobility status (IV beeping)        Time: 8882-8003 OT Time Calculation (min): 23 min  Charges: OT General Charges $OT Visit: 1 Visit OT Treatments $Self Care/Home Management : 23-37 mins  Amena Dockham, OTR/L [place]  Office [phone] Pager: [place] 02/16/2020, 5:00 PM

## 2020-02-16 NOTE — Progress Notes (Signed)
PROGRESS NOTE    Katelyn Lamb  CZY:[id number] DOB: December 26, 1945 DOA: 02/12/2020 PCP: [name], MD   Brief Narrative:   Katelyn Lamb is a 74 y.o. who presents to the emergency department via EMS for altered mental status and fever. Sxs began at midnight.Per EMS they received a call from patient's husband whom she lives with at home that the patient was altered and felt hot to the touch. He described her altered mental status as speaking nonsensically about things like raccoons. Upon EMS arrival patient was febrile to 102,she was tachycardic, and noted to be hypoxic in the mid 80s on room air improved with application of 15 L nonrebreather. Patient reports to me that she has had a cough, she has no other specific complaints but does not necessarilyconsistently answer questions. She denies headache, chest pain, dyspnea, abdominal pain, dysuria, melena, or hematochezia.Level 5 caveat applies secondary to altered mental status. EMS administered 1L NS en route.Patient's husband is reported to be en route to the hospital.Patient has had both doses of her COVID 19 vaccine.  Husband states that patient has been having frequent recurrent UTIs. Has been working with Dr. [name], ID to try and identify organism causing these frequent infections. Stated had been sent home on a 10-day course of antibiotics via PICC line, patient did well until IV antibiotics discontinued and then infection returned. Not on home O2. A/O x4, follows all commands. Positive S OB, negative CP, positive back pain  6/15: Merrem through tonight. She's having periods of incontinence and retention. She has urology follow up. Will likely need to go home with foley cath, but will hold as long as we can. Will begin suppressive therapy with macrobid tomorrow. Likely home tomorrow.   Assessment & Plan:   Principal Problem:   Sepsis secondary to UTI [facility]) Active Problems:   Herpes zoster without complication    Thrombocytopenia (HCC)   Incomplete paraplegia (HCC)   Neurogenic bladder   Acute deep vein thrombosis (DVT) of popliteal vein of left lower extremity (HCC)   Anxiety about health   Reactive depression   Ataxia   Delirium   SIRS (systemic inflammatory response syndrome) (HCC)   Encephalitis due to human herpes simplex virus (HSV)   Chronic neck pain   DVT, lower extremity, distal, chronic (HCC)   Generalized anxiety disorder   Severe sepsis ([place])   UTI (urinary tract infection)   Acute respiratory failure with hypoxia (HCC)   Spinal stenosis   Right pulmonary embolus (HCC)   Acute metabolic encephalopathy  Severe Sepsis secondary to UTI - Upon admission patient met criteria for sepsis temp>30 C, HR>90, RR>20, lactic acidosis - Echo: EF 65%, G1DD - Normal saline 134m/hr - 6/10 patient seen by [name] ID who recommended Macrobid x30 days for chronic suppression. Follow-up with Dr. [name] in 2 to 3 weeks. - We will ensure ID instructions follow once patient stable for discharge. - Rpt UCx w/ no growth. We will extend her merrem coverage to 5 days and then continue her suppressive macrobid. TTF.     - 6/15: Merrem through tonight; then transition to macrobid. She needs to maintain follow up with urology.    Neurogenic bladder - Patient's MAR does not contain medication for neurogenic bladder - Bladder scan TID; checking for complete emptying of bladder - If patient having significant urine retention may be contributing to her frequent UTI we will add medication. - on flomax, but still retaining, place foley; attempt voiding trial  as early as possible - 6/14: Per protocol, she is supposed to try I&O cathing for 24hours before she can return to a foley; should she need replacement of foley, she will discharge to home with it and follow up with urology outpt.   Acute respiratory failure with hypoxia Acute RIGHT  lower lobe PE - Patient does not use home O2 - Most likely secondary to acute RIGHT PE - she's on RA. Continue xarelto loading dose; this is her second clot, so she will likely need this for life.   Incomplete paraplegia - Stable  Herpes zoster without complication - Lyrica 212 mg nightly  Generalized anxiety disorder Reactive depression - Cymbalta 30 mg every morning/60 mg nightly  Essential HTN - BP meds held d/t to her need for pressors - off pressors, BP ok, follow  Goals of care - PT/OT rec HH; consult TOC  DVT prophylaxis: xarelto Code Status: FULL Family Communication: With husband at bedside   Status is: Inpatient  Remains inpatient appropriate because:IV treatments appropriate due to intensity of illness or inability to take PO   Dispo: The patient is from: Home              Anticipated d/c is to: Home              Anticipated d/c date is: 1 day              Patient currently is not medically stable to d/c.  Consultants:   PCCM  Antimicrobials:  Marland Kitchen Merrem   ROS:  Denies CP, N, V, ab pain . Remainder 10-pt ROS is negative for all not previously mentioned.  Subjective: "Why do I keep getting these?"  Objective: Vitals:   02/15/20 1537 02/15/20 2151 02/16/20 0536 02/16/20 1334  BP: 99/74 114/77 (!) 126/93 (!) 113/99  Pulse: 85 70 88 (!) 107  Resp: '19 18 18 17  ' Temp: (!) 97.4 F (36.3 C) 97.7 F (36.5 C) 98 F (36.7 C) 98.2 F (36.8 C)  TempSrc: Oral Oral Oral Oral  SpO2: 92% 92% 93% 90%  Weight:      Height:        Intake/Output Summary (Last 24 hours) at 02/16/2020 1512 Last data filed at 02/16/2020 2482 Gross per 24 hour  Intake 1393.4 ml  Output 1750 ml  Net -356.6 ml   Filed Weights   02/12/20 0511 02/12/20 0944  Weight: 73.2 kg 72.7 kg    Examination:  General: 74 y.o. female resting in bed in NAD Cardiovascular: RRR, +S1, S2, no m/g/r Respiratory: CTABL, no w/r/r, normal WOB GI: BS+,  NDNT, soft MSK: No e/c/c Neuro: Alert to name, follows commands Psyc: Appropriate interaction and affect, calm/cooperative   Data Reviewed: I have personally reviewed following labs and imaging studies.  CBC: Recent Labs  Lab 02/12/20 0520 02/13/20 0538 02/14/20 0244 02/15/20 0415 02/16/20 0424  WBC 12.2* 14.7* 8.9 7.2 7.3  NEUTROABS 10.0* 12.2* 4.9 3.3 3.3  HGB 11.5* 10.3* 9.5* 11.0* 11.8*  HCT 37.2 32.5* 30.4* 35.8* 37.1  MCV 97.9 95.6 97.7 96.8 94.4  PLT 249 220 195 235 500   Basic Metabolic Panel: Recent Labs  Lab 02/12/20 0520 02/13/20 0538 02/14/20 0244 02/15/20 0415 02/16/20 0424  NA 138 138 142 142 141  K 3.7 4.2 4.0 4.0 4.2  CL 101 105 110 106 104  CO2 '26 23 26 24 26  ' GLUCOSE 126* 105* 110* 108* 106*  BUN '10 8 8 ' 7* 8  CREATININE 1.05* 0.76 0.74  0.68 0.72  CALCIUM 8.4* 8.7* 8.3* 8.7* 8.9  MG  --  1.7 1.8 1.7 1.8  PHOS  --  2.4* 2.5 2.2* 3.4   GFR: Estimated Creatinine Clearance: 63.5 mL/min (by C-G formula based on SCr of 0.72 mg/dL). Liver Function Tests: Recent Labs  Lab 02/12/20 0520 02/13/20 0538 02/14/20 0244 02/15/20 0415 02/16/20 0424  AST 49* 42* '22 27 29  ' ALT '26 25 20 20 19  ' ALKPHOS 160* 132* 113 130* 138*  BILITOT 0.6 1.0 0.5 0.3 0.4  PROT 6.9 6.2* 5.6* 6.4* 6.7  ALBUMIN 3.6 3.1* 2.6* 3.0* 3.1*   No results for input(s): LIPASE, AMYLASE in the last 168 hours. No results for input(s): AMMONIA in the last 168 hours. Coagulation Profile: Recent Labs  Lab 02/12/20 0520 02/13/20 0538  INR 1.1 1.1   Cardiac Enzymes: No results for input(s): CKTOTAL, CKMB, CKMBINDEX, TROPONINI in the last 168 hours. BNP (last 3 results) No results for input(s): PROBNP in the last 8760 hours. HbA1C: No results for input(s): HGBA1C in the last 72 hours. CBG: No results for input(s): GLUCAP in the last 168 hours. Lipid Profile: No results for input(s): CHOL, HDL, LDLCALC, TRIG, CHOLHDL, LDLDIRECT in the last 72 hours. Thyroid Function Tests: No  results for input(s): TSH, T4TOTAL, FREET4, T3FREE, THYROIDAB in the last 72 hours. Anemia Panel: No results for input(s): VITAMINB12, FOLATE, FERRITIN, TIBC, IRON, RETICCTPCT in the last 72 hours. Sepsis Labs: Recent Labs  Lab 02/13/20 0204 02/13/20 0538 02/13/20 0737 02/13/20 1045  PROCALCITON  --  1.74  --   --   LATICACIDVEN 2.1* 1.3 1.1 1.4    Recent Results (from the past 240 hour(s))  Blood Culture (routine x 2)     Status: None (Preliminary result)   Collection Time: 02/12/20  5:21 AM   Specimen: BLOOD  Result Value Ref Range Status   Specimen Description   Final    BLOOD RIGHT ANTECUBITAL Performed at [facility], [place] [address], [place], [place] [address]    Special Requests   Final    BOTTLES DRAWN AEROBIC AND ANAEROBIC Blood Culture adequate volume Performed at [place] [address] [place], [place] [address]    Culture   Final    NO GROWTH 4 DAYS Performed at [facility], [place] [address], [place], [place] [address]    Report Status PENDING  Incomplete  Blood Culture (routine x 2)     Status: None (Preliminary result)   Collection Time: 02/12/20  5:21 AM   Specimen: BLOOD  Result Value Ref Range Status   Specimen Description   Final    BLOOD BLOOD LEFT FOREARM Performed at [place] [address] [place], [place] [address]    Special Requests   Final    BOTTLES DRAWN AEROBIC AND ANAEROBIC Blood Culture adequate volume Performed at [place] [address], [place], [place] [address]    Culture   Final    NO GROWTH 4 DAYS Performed at [facility], [place] [address] [place], [place] [address]    Report Status PENDING  Incomplete  Urine culture     Status: Abnormal   Collection Time: 02/12/20  5:48 AM   Specimen: In/Out Cath Urine  Result Value Ref Range Status   Specimen Description   Final    IN/OUT CATH URINE Performed at [place] [address] [place], [place] [address]    Special  Requests   Final    NONE Performed at [facility], [place] [address] [place], [place] [address]    Culture (A)  Final    40,000 COLONIES/mL MULTIPLE SPECIES PRESENT, SUGGEST RECOLLECTION   Report Status 02/13/2020 FINAL  Final  SARS Coronavirus 2 by RT PCR (hospital order, performed in [facility] hospital lab) Nasopharyngeal Nasopharyngeal Swab     Status: None   Collection Time: 02/12/20  5:48 AM   Specimen: Nasopharyngeal Swab  Result Value Ref Range Status   SARS Coronavirus 2 NEGATIVE NEGATIVE Final    Comment: (NOTE) SARS-CoV-2 target nucleic acids are NOT DETECTED.  The SARS-CoV-2 RNA is generally detectable in upper and lower respiratory specimens during the acute phase of infection. The lowest concentration of SARS-CoV-2 viral copies this assay can detect is 250 copies / mL. A negative result does not preclude SARS-CoV-2 infection and should not be used as the sole basis for treatment or other patient management decisions.  A negative result may occur with improper specimen collection / handling, submission of specimen other than nasopharyngeal swab, presence of viral mutation(s) within the areas targeted by this assay, and inadequate number of viral copies (<250 copies / mL). A negative result must be combined with clinical observations, patient history, and epidemiological information.  Fact Sheet for Patients:   StrictlyIdeas.no  Fact Sheet for Healthcare Providers: BankingDealers.co.za  This test is not yet approved or  cleared by the [place] FDA and has been authorized for detection and/or diagnosis of SARS-CoV-2 by FDA under an Emergency Use Authorization (EUA).  This EUA will remain in effect (meaning this test can be used) for the duration of the COVID-19 declaration under Section 564(b)(1) of the Act, 21 U.S.C. section  360bbb-3(b)(1), unless the authorization is terminated or revoked sooner.  Performed at [facility], [place] [address], [place], [place] [address]   MRSA PCR Screening     Status: None   Collection Time: 02/12/20  9:51 AM   Specimen: Nasal Mucosa; Nasopharyngeal  Result Value Ref Range Status   MRSA by PCR NEGATIVE NEGATIVE Final    Comment:        The GeneXpert MRSA Assay (FDA approved for NASAL specimens only), is one component of a comprehensive MRSA colonization surveillance program. It is not intended to diagnose MRSA infection nor to guide or monitor treatment for MRSA infections. Performed at [facility], [place] [address] [place], [place] [address]   Culture, Urine     Status: None   Collection Time: 02/13/20  2:27 PM   Specimen: Urine, Random  Result Value Ref Range Status   Specimen Description   Final    URINE, RANDOM Performed at [place] [address], [place], [place] [address]    Special Requests   Final    NONE Performed at [facility], [place] [address], [place], [place] [address]    Culture   Final    NO GROWTH Performed at [facility], [place] [address], [place], [place] [address]    Report Status 02/14/2020 FINAL  Final      Radiology Studies: No results found.   Scheduled Meds: . Chlorhexidine Gluconate Cloth  6 each Topical Daily  . DULoxetine  30 mg Oral q morning - 10a  . DULoxetine  60 mg Oral QHS  . Gerhardt's butt cream   Topical TID  . mouth rinse  15 mL Mouth Rinse BID  . [START  ON 02/17/2020] nitrofurantoin (macrocrystal-monohydrate)  100 mg Oral QHS  . omega-3 acid ethyl esters  1 g Oral Daily  . pantoprazole  40 mg Oral QHS  . pregabalin  100 mg Oral QHS  . psyllium  1 packet Oral Daily  . Rivaroxaban  15 mg Oral BID WC   Followed by  . [START ON 03/06/2020] rivaroxaban  20 mg Oral Q supper  . tamsulosin  0.4 mg Oral Daily   Continuous  Infusions: . sodium chloride 50 mL/hr at 02/15/20 1800  . sodium chloride 10 mL/hr at 02/12/20 1800  . sodium chloride    . meropenem (MERREM) IV 1 g (02/16/20 0908)     LOS: 4 days    Time spent: 25 minutes spent in the coordination of care today.    Jonnie Finner, DO [facility]  If 7PM-7AM, please contact night-coverage [email or web address] 02/16/2020, 3:12 PM

## 2020-02-16 NOTE — TOC Progression Note (Signed)
Transition of Care [facility]) - Progression Note    Patient Details  Name: Katelyn Lamb MRN: [id number] Date of Birth: November 12, 1945  Transition of Care [facility]) CM/SW Contact  Ross Ludwig, [place] Phone Number: 02/16/2020, 7:00 PM  Clinical Narrative:     CSW confirmed that patient is currently open to [facility] for home health services.  Per patient's husband they want to continue with home health services.        Expected Discharge Plan and Services                                                 Social Determinants of Health (SDOH) Interventions    Readmission Risk Interventions No flowsheet data found.

## 2020-02-16 NOTE — Progress Notes (Signed)
Patient had incontinent episode at 2300. At 2350 prior to bladder scan, patient voided 200 in canister and bladder scan shows 205. Patient started getting iriitated and wants to sleep. We cont to monitor.

## 2020-02-16 NOTE — Consult Note (Signed)
WOC Nurse Consult Note: Reason for Consult: Deep tissue pressure injury (DTPI) to right heel. Present on admission. Wound type: Began as friction injury blister at home, turned dark prior to admission and is currently resolving Pressure Injury POA: Yes Measurement:3cm x 2cm x 0.1cm Wound QAS:TMHD pink, dry Drainage (amount, consistency, odor) None Periwound:intact, dry Dressing procedure/placement/frequency: Patient and husband give report on history of right heel tissue injury, relaying that her preferred position for rest is supine and with her knees bent, heels flat. When she experiences neuropathic pain, she rubs her heels on the sheets/mattress. Husband reports a "blister" that became dark prior to admission.  Now resolving. Heel boots will be provided for use here and at home post discharge. Additionally, I am requested to see the patient's perianal area that presents with erythema and a faint satellite rash consistent with fungal overgrowth that is likely related to her UTI-related antibiotic use. I will provide patient with Gerhart's Butt Cream, a 1:1:1 compounded zinc oxide:lotrimin: hydrocortisone cream product that should resolve this issue.   [place] nursing team will not follow, but will remain available to this patient, the nursing and medical teams.  Please re-consult if needed. Thanks, Maudie Flakes, MSN, RN, [place], Arther Abbott  Pager# [phone]

## 2020-02-16 NOTE — Progress Notes (Signed)
Bladder scan completed 460 cc. Provider contacted. V.O received to insert foley catheter due to urinary retention.

## 2020-02-17 LAB — COMPREHENSIVE METABOLIC PANEL
ALT: 23 U/L (ref 0–44)
AST: 34 U/L (ref 15–41)
Albumin: 3.3 g/dL — ABNORMAL LOW (ref 3.5–5.0)
Alkaline Phosphatase: 146 U/L — ABNORMAL HIGH (ref 38–126)
Anion gap: 8 (ref 5–15)
BUN: 9 mg/dL (ref 8–23)
CO2: 26 mmol/L (ref 22–32)
Calcium: 9.1 mg/dL (ref 8.9–10.3)
Chloride: 106 mmol/L (ref 98–111)
Creatinine, Ser: 0.63 mg/dL (ref 0.44–1.00)
GFR calc Af Amer: >60 mL/min (ref >60)
GFR calc non Af Amer: >60 mL/min (ref >60)
Glucose, Bld: 106 mg/dL — ABNORMAL HIGH (ref 70–99)
Potassium: 4.1 mmol/L (ref 3.5–5.1)
Sodium: 140 mmol/L (ref 135–145)
Total Bilirubin: 0.4 mg/dL (ref 0.3–1.2)
Total Protein: 6.9 g/dL (ref 6.5–8.1)

## 2020-02-17 LAB — CULTURE, BLOOD (ROUTINE X 2)
Culture: NO GROWTH
Culture: NO GROWTH
Special Requests: ADEQUATE
Special Requests: ADEQUATE

## 2020-02-17 LAB — CBC WITH DIFFERENTIAL/PLATELET
Abs Immature Granulocytes: 0.05 10*3/uL (ref 0.00–0.07)
Basophils Absolute: 0 10*3/uL (ref 0.0–0.1)
Basophils Relative: 1 %
Eosinophils Absolute: 0.4 10*3/uL (ref 0.0–0.5)
Eosinophils Relative: 5 %
HCT: 40.1 % (ref 36.0–46.0)
Hemoglobin: 12.8 g/dL (ref 12.0–15.0)
Immature Granulocytes: 1 %
Lymphocytes Relative: 36 %
Lymphs Abs: 3.1 10*3/uL (ref 0.7–4.0)
MCH: 30.3 pg (ref 26.0–34.0)
MCHC: 31.9 g/dL (ref 30.0–36.0)
MCV: 95 fL (ref 80.0–100.0)
Monocytes Absolute: 0.6 10*3/uL (ref 0.1–1.0)
Monocytes Relative: 7 %
Neutro Abs: 4.4 10*3/uL (ref 1.7–7.7)
Neutrophils Relative %: 50 %
Platelets: 277 10*3/uL (ref 150–400)
RBC: 4.22 MIL/uL (ref 3.87–5.11)
RDW: 13.4 % (ref 11.5–15.5)
WBC: 8.5 10*3/uL (ref 4.0–10.5)
nRBC: 0 % (ref 0.0–0.2)

## 2020-02-17 LAB — MAGNESIUM: Magnesium: 1.9 mg/dL (ref 1.7–2.4)

## 2020-02-17 LAB — PHOSPHORUS: Phosphorus: 3 mg/dL (ref 2.5–4.6)

## 2020-02-17 MED ORDER — RIVAROXABAN 20 MG PO TABS: 20.0000 mg | ORAL_TABLET | Freq: Every day | ORAL | 1 refills | Status: DC

## 2020-02-17 MED ORDER — NITROFURANTOIN MONOHYD MACRO 100 MG PO CAPS
100.0000 mg | ORAL_CAPSULE | Freq: Every day | ORAL | 0 refills | Status: DC
Start: 2020-02-17 — End: 2020-08-10

## 2020-02-17 MED ORDER — RIVAROXABAN 15 MG PO TABS: 15.0000 mg | ORAL_TABLET | Freq: Two times a day (BID) | ORAL | 0 refills | Status: DC

## 2020-02-17 NOTE — Progress Notes (Signed)
Physical Therapy Treatment Patient Details Name: Katelyn Lamb MRN: [id number] DOB: [birth year] Today's Date: 02/17/2020    History of Present Illness 74 y.o. female with a history of herpes zoster with prior encephalitis, thrombocytopenia, GERD, anemia, prior DVT, transverse myelitis, recurrent UTI, chronic pain, memory difficulty, and sepsis who presents to the emergency department via EMS for altered mental status and fever. Pt with sepsis secondary to UTI, RLL PE on lovenox.    PT Comments    Pt assisted with ambulating in hallway.  Pt requiring min-mod assist mostly for bed mobility.    Follow Up Recommendations  Home health PT;Supervision/Assistance - 24 hour     Equipment Recommendations  None recommended by PT    Recommendations for Other Services       Precautions / Restrictions Precautions Precautions: Fall    Mobility  Bed Mobility Overal bed mobility: Needs Assistance Bed Mobility: Supine to Sit;Sit to Supine     Supine to sit: Mod assist Sit to supine: Mod assist   General bed mobility comments: assist for scooting to EOB, pt with great difficult despite weight shifting cues; assist for LEs onto bed  Transfers Overall transfer level: Needs assistance Equipment used: Rolling walker (2 wheeled) Transfers: Sit to/from Stand Sit to Stand: Min assist         General transfer comment: assist for rise and steady  Ambulation/Gait Ambulation/Gait assistance: Min guard Gait Distance (Feet): 80 Feet Assistive device: Rolling walker (2 wheeled) Gait Pattern/deviations: Step-through pattern;Decreased stride length     General Gait Details: verbal cues for safe use of RW, HR up to 135 bpm (RN notified); distance to tolerance   Stairs             Wheelchair Mobility    Modified Rankin (Stroke Patients Only)       Balance                                            Cognition Arousal/Alertness: Awake/alert Behavior During  Therapy: WFL for tasks assessed/performed   Area of Impairment: Safety/judgement;Awareness;Problem solving                       Following Commands: Follows one step commands with increased time Safety/Judgement: Decreased awareness of safety;Decreased awareness of deficits   Problem Solving: Slow processing;Decreased initiation;Requires verbal cues;Difficulty sequencing;Requires tactile cues General Comments: required verbal and tactile cues for sequencing for all tasks      Exercises      General Comments        Pertinent Vitals/Pain Pain Assessment: No/denies pain    Home Living                      Prior Function            PT Goals (current goals can now be found in the care plan section) Progress towards PT goals: Progressing toward goals    Frequency    Min 3X/week      PT Plan Current plan remains appropriate    Co-evaluation              AM-PAC PT "6 Clicks" Mobility   Outcome Measure  Help needed turning from your back to your side while in a flat bed without using bedrails?: A Little Help needed moving from lying on your back to sitting on  the side of a flat bed without using bedrails?: A Little Help needed moving to and from a bed to a chair (including a wheelchair)?: A Little Help needed standing up from a chair using your arms (e.g., wheelchair or bedside chair)?: A Little Help needed to walk in hospital room?: A Little Help needed climbing 3-5 steps with a railing? : A Lot 6 Click Score: 17    End of Session Equipment Utilized During Treatment: Gait belt Activity Tolerance: Patient tolerated treatment well Patient left: with call bell/phone within reach;with family/visitor present;in bed Nurse Communication: Mobility status PT Visit Diagnosis: Muscle weakness (generalized) (M62.81);Difficulty in walking, not elsewhere classified (R26.2)     Time: 3383-2919 PT Time Calculation (min) (ACUTE ONLY): 20 min  Charges:   $Gait Training: 8-22 mins                     Arlyce Dice, DPT Acute Rehabilitation Services Pager: [phone] Office: [phone]  York Ram E 02/17/2020, 1:13 PM

## 2020-02-17 NOTE — Discharge Summary (Signed)
DISCHARGE SUMMARY  Katelyn Lamb  MR#: [id number]  DOB:1945/11/25  Date of Admission: 02/12/2020 Date of Discharge: 02/17/2020  Attending Physician:Caytlin Better Hennie Duos, MD  Patient's HYQ:MVHQION, Dibas, MD  Consults: PCCM  Disposition: D/C home   Follow-up Appts:  Follow-up Information    Koirala, Dibas, MD Follow up in 1 week(s).   Specialty: Family Medicine Contact information: [place] [address] [address] [phone]               Tests Needing Follow-up: -assure pt tolerating Xarelto and following proper dosing   Discharge Diagnoses: Severe sepsis secondary to UTI Neurogenic bladder Acute pulmonary embolism Incomplete paraplegia - transverse myelitis  Herpes zoster without complication Generalized anxiety disorder HTN Deep tissue injury right heel -present on admission  Initial presentation: 74 year old with a history of frequently recurrent UTIs followed by ID, anxiety disorder, herpes zoster with prior encephalitis, chronic thrombocytopenia, prior DVT, transverse myelitis, and GERD who presented to the ED via EMS with altered mental status and fever after the acute onset of symptoms.  EMS found the patient to have a temperature of 102 with tachycardia and hypoxia.  Hospital Course:  Severe sepsis secondary to UTI At time of admission patient had a fever with tachycardia tachypnea and lactic acidosis - she was volume resuscitated using normal saline -her ID physician was contacted and agreed with meropenem for initial inpatient IV coverage with transition to [facility] for a 30-day course -she is to follow-up with her urologist Dr. Amalia Hailey at [facility] in 2 to 3 weeks -urine culture this admission did not reveal the offending pathogen  Neurogenic bladder Patient experienced frequent recurrent urinary retention during her hospital stay -Flomax was provided but did not produce significant results -at this time she is to be  discharged home with a Foley catheter in place with urology follow-up established  Acute pulmonary embolism Evaluation of hypoxia at presentation revealed a right lower lobe region pulmonary embolism -patient was able to improve to the point of stable saturations on room air -Xarelto dosing was initiated -bilateral lower extremity venous duplex were unrevealing for DVT -history reveals that this is her second episode of clotting therefore she will need to consider lifelong anticoagulation  Incomplete paraplegia - transverse myelitis  HHPT and OT being arranged at time of d/c   Herpes zoster without complication Continue usual home medical tx   Generalized anxiety disorder Continue Cymbalta without change  HTN BP meds held at time of admission due to hypotension and initial need for vasopressors  Deep tissue injury right heel -present on admission Was evaluated by [facility] during hospital stay -to continue use of heel boots  Allergies as of 02/17/2020      Reactions   Demerol [meperidine] Other (See Comments)   Hallucinations   Percocet [oxycodone-acetaminophen] Itching   Amoxicillin-pot Clavulanate Diarrhea   Severe pain, headache, intestinal infection   Penicillins Itching, Rash   Has patient had a PCN reaction causing immediate rash, facial/tongue/throat swelling, SOB or lightheadedness with hypotension:  NO Has patient had a PCN reaction causing severe rash involving mucus membranes or skin necrosis: No Has patient had a PCN reaction that required hospitalization: No Has patient had a PCN reaction occurring within the last 10 years: Yes If all of the above answers are "NO", then may proceed with Cephalosporin use.      Medication List    TAKE these medications   acyclovir 400 MG tablet Commonly known as: ZOVIRAX TAKE 1 TABLET BY  MOUTH TWICE A DAY   diazepam 5 MG tablet Commonly known as: VALIUM Take 1 tablet (5 mg total) by mouth every 12 (twelve) hours as needed for  anxiety. What changed: when to take this   diphenoxylate-atropine 2.5-0.025 MG tablet Commonly known as: LOMOTIL Take 1 tablet by mouth 4 (four) times daily as needed for diarrhea or loose stools.   DULoxetine 30 MG capsule Commonly known as: CYMBALTA TAKE 1 CAPSULE BY MOUTH EVERY DAY What changed:   how much to take  when to take this   DULoxetine 60 MG capsule Commonly known as: CYMBALTA Take 1 capsule (60 mg total) by mouth at bedtime. What changed: Another medication with the same name was changed. Make sure you understand how and when to take each.   Hair/Skin/Nails/Biotin Tabs Take 1 tablet by mouth daily.   HYDROcodone-acetaminophen 10-325 MG tablet Commonly known as: NORCO Take 1 tablet by mouth every 6 (six) hours as needed for pain.   ipratropium-albuterol 0.5-2.5 (3) MG/3ML Soln Commonly known as: DUONEB Take 3 mLs by nebulization every 4 (four) hours as needed (For SOB and wheezing).   nitrofurantoin (macrocrystal-monohydrate) 100 MG capsule Commonly known as: Macrobid Take 1 capsule (100 mg total) by mouth at bedtime.   omega-3 acid ethyl esters 1 g capsule Commonly known as: LOVAZA Take 1 g by mouth daily.   pantoprazole 40 MG tablet Commonly known as: PROTONIX Take 1 tablet (40 mg total) by mouth daily. What changed: when to take this   polyethylene glycol 17 g packet Commonly known as: MIRALAX / GLYCOLAX Take 17 g by mouth daily. What changed:   when to take this  reasons to take this   pregabalin 50 MG capsule Commonly known as: LYRICA Take 100 mg by mouth at bedtime.   QUEtiapine 25 MG tablet Commonly known as: SEROquel Take 1 tablet (25 mg total) by mouth at bedtime. What changed:   when to take this  reasons to take this   Rivaroxaban 15 MG Tabs tablet Commonly known as: XARELTO Take 1 tablet (15 mg total) by mouth 2 (two) times daily with a meal.   rivaroxaban 20 MG Tabs tablet Commonly known as: XARELTO Take 1 tablet (20  mg total) by mouth daily with supper. Start taking on: March 06, 2020   Voltaren 1 % Gel Generic drug: diclofenac Sodium Apply 1 application topically daily as needed (pain).       Day of Discharge BP 119/72 (BP Location: Right Arm)   Pulse 92   Temp 98.4 F (36.9 C) (Oral)   Resp 16   Ht 5\' 5"  (1.651 m)   Wt 72.7 kg   SpO2 91%   BMI 26.67 kg/m   Physical Exam: General: No acute respiratory distress Lungs: Clear to auscultation bilaterally without wheezes or crackles Cardiovascular: Regular rate and rhythm without murmur gallop or rub normal S1 and S2 Abdomen: Nontender, nondistended, soft, bowel sounds positive, no rebound, no ascites, no appreciable mass Extremities: No significant cyanosis, clubbing, or edema bilateral lower extremities  Basic Metabolic Panel: Recent Labs  Lab 02/13/20 0538 02/14/20 0244 02/15/20 0415 02/16/20 0424 02/17/20 0453  NA 138 142 142 141 140  K 4.2 4.0 4.0 4.2 4.1  CL 105 110 106 104 106  CO2 23 26 24 26 26   GLUCOSE 105* 110* 108* 106* 106*  BUN 8 8 7* 8 9  CREATININE 0.76 0.74 0.68 0.72 0.63  CALCIUM 8.7* 8.3* 8.7* 8.9 9.1  MG 1.7 1.8 1.7 1.8  1.9  PHOS 2.4* 2.5 2.2* 3.4 3.0    Liver Function Tests: Recent Labs  Lab 02/13/20 0538 02/14/20 0244 02/15/20 0415 02/16/20 0424 02/17/20 0453  AST 42* 22 27 29  34  ALT 25 20 20 19 23   ALKPHOS 132* 113 130* 138* 146*  BILITOT 1.0 0.5 0.3 0.4 0.4  PROT 6.2* 5.6* 6.4* 6.7 6.9  ALBUMIN 3.1* 2.6* 3.0* 3.1* 3.3*    Coags: Recent Labs  Lab 02/12/20 0520 02/13/20 0538  INR 1.1 1.1    CBC: Recent Labs  Lab 02/13/20 0538 02/14/20 0244 02/15/20 0415 02/16/20 0424 02/17/20 0453  WBC 14.7* 8.9 7.2 7.3 8.5  NEUTROABS 12.2* 4.9 3.3 3.3 4.4  HGB 10.3* 9.5* 11.0* 11.8* 12.8  HCT 32.5* 30.4* 35.8* 37.1 40.1  MCV 95.6 97.7 96.8 94.4 95.0  PLT 220 195 235 256 277    Recent Results (from the past 240 hour(s))  Blood Culture (routine x 2)     Status: None   Collection Time:  02/12/20  5:21 AM   Specimen: BLOOD  Result Value Ref Range Status   Specimen Description   Final    BLOOD RIGHT ANTECUBITAL Performed at [facility], [place] [address] [place], [place] [address]    Special Requests   Final    BOTTLES DRAWN AEROBIC AND ANAEROBIC Blood Culture adequate volume Performed at [place] [address] [place], [place] [address]    Culture   Final    NO GROWTH 5 DAYS Performed at [facility], [place] [address], [place], [place] [address]    Report Status 02/17/2020 FINAL  Final  Blood Culture (routine x 2)     Status: None   Collection Time: 02/12/20  5:21 AM   Specimen: BLOOD  Result Value Ref Range Status   Specimen Description   Final    BLOOD BLOOD LEFT FOREARM Performed at [place] [address], [place], [place] [address]    Special Requests   Final    BOTTLES DRAWN AEROBIC AND ANAEROBIC Blood Culture adequate volume Performed at [place] [address] [place], [place] [address]    Culture   Final    NO GROWTH 5 DAYS Performed at [facility], [place] [address], [place], [place] [address]    Report Status 02/17/2020 FINAL  Final  Urine culture     Status: Abnormal   Collection Time: 02/12/20  5:48 AM   Specimen: In/Out Cath Urine  Result Value Ref Range Status   Specimen Description   Final    IN/OUT CATH URINE Performed at [place] [address] [place], [place] [address]    Special Requests   Final    NONE Performed at [facility], [place] [address] [place], [place] [address]    Culture (A)  Final    40,000 COLONIES/mL MULTIPLE SPECIES PRESENT, SUGGEST RECOLLECTION   Report Status 02/13/2020 FINAL  Final  SARS Coronavirus 2 by RT PCR (hospital order, performed in [facility] hospital lab) Nasopharyngeal Nasopharyngeal Swab     Status: None   Collection Time: 02/12/20  5:48 AM   Specimen:  Nasopharyngeal Swab  Result Value Ref Range Status   SARS Coronavirus 2 NEGATIVE NEGATIVE Final    Comment: (NOTE) SARS-CoV-2 target nucleic acids are NOT DETECTED.  The SARS-CoV-2 RNA is generally detectable in upper and lower respiratory specimens during the acute phase of infection. The lowest concentration of  SARS-CoV-2 viral copies this assay can detect is 250 copies / mL. A negative result does not preclude SARS-CoV-2 infection and should not be used as the sole basis for treatment or other patient management decisions.  A negative result may occur with improper specimen collection / handling, submission of specimen other than nasopharyngeal swab, presence of viral mutation(s) within the areas targeted by this assay, and inadequate number of viral copies (<250 copies / mL). A negative result must be combined with clinical observations, patient history, and epidemiological information.  Fact Sheet for Patients:   StrictlyIdeas.no  Fact Sheet for Healthcare Providers: BankingDealers.co.za  This test is not yet approved or  cleared by the [place] FDA and has been authorized for detection and/or diagnosis of SARS-CoV-2 by FDA under an Emergency Use Authorization (EUA).  This EUA will remain in effect (meaning this test can be used) for the duration of the COVID-19 declaration under Section 564(b)(1) of the Act, 21 U.S.C. section 360bbb-3(b)(1), unless the authorization is terminated or revoked sooner.  Performed at [facility], [place] [address] [place], [place] [address]   MRSA PCR Screening     Status: None   Collection Time: 02/12/20  9:51 AM   Specimen: Nasal Mucosa; Nasopharyngeal  Result Value Ref Range Status   MRSA by PCR NEGATIVE NEGATIVE Final    Comment:        The GeneXpert MRSA Assay (FDA approved for NASAL specimens only), is one component of a comprehensive MRSA  colonization surveillance program. It is not intended to diagnose MRSA infection nor to guide or monitor treatment for MRSA infections. Performed at [facility], [place] [address], [place], [place] [address]   Culture, Urine     Status: None   Collection Time: 02/13/20  2:27 PM   Specimen: Urine, Random  Result Value Ref Range Status   Specimen Description   Final    URINE, RANDOM Performed at [place] [address], [place], [place] [address]    Special Requests   Final    NONE Performed at [facility], [place] [address] [place], [place] [address]    Culture   Final    NO GROWTH Performed at [facility], [place] [address], [place], [place] [address]    Report Status 02/14/2020 FINAL  Final     Time spent in discharge (includes decision making & examination of pt): 35 minutes  02/17/2020, 3:06 PM   Cherene Altes, MD [facility] Office  [phone]

## 2020-02-17 NOTE — Progress Notes (Signed)
No change from am assessment. Pt is alert, oriented(x4). Out of bed one assist with walker. Discharge instructions reviewed with patient and spouse Katelyn Lamb). Reinforcement education provided regarding foley care, medication and post discharge follow up visits (Urology, primary, neurology). Questions, concerns denied at this time.

## 2020-02-17 NOTE — Progress Notes (Signed)
Discussed Home Health ([facility]) services with patient and spouse.

## 2020-02-17 NOTE — TOC Transition Note (Signed)
Transition of Care [facility]) - CM/SW Discharge Note   Patient Details  Name: AALLIYAH KILKER MRN: [id number] Date of Birth: March 17, 1946  [facility] of [facility] CM/SW Contact:  Ross Ludwig, LCSW Phone Number: 02/17/2020, 4:15 PM   Clinical Narrative:     Patient is active with [facility], patient's husband would like to continue receiving services from them.  CSW contacted [facility] and they will continue to see patient.  Patient does not need any equipment.  Patient will be going home with home health through [facility].  CSW signing off please reconsult with any other social work needs, home health agency has been notified of planned discharge.   Final next level of care: [place] Barriers to Discharge: Barriers Resolved   Patient Goals and CMS Choice Patient states their goals for this hospitalization and ongoing recovery are:: To return back home with home health. CMS Medicare.gov Compare Post Acute Care list provided to:: Patient Choice offered to / list presented to : Patient  Discharge Placement                       Discharge Plan and Services                  DME Agency: NA       HH Arranged: RN, PT, OT, Nurse's Aide [place] Agency: [facility] Date [facility] Agency Contacted: 02/17/20 Time [place]: 1614 Representative spoke with at [place]: Amy  Social Determinants of Health ([place]) Interventions     Readmission Risk Interventions No flowsheet data found.

## 2020-02-22 DIAGNOSIS — L97419 Non-pressure chronic ulcer of right heel and midfoot with unspecified severity: Secondary | ICD-10-CM | POA: Diagnosis not present

## 2020-02-22 DIAGNOSIS — I82502 Chronic embolism and thrombosis of unspecified deep veins of left lower extremity: Secondary | ICD-10-CM | POA: Diagnosis not present

## 2020-02-22 DIAGNOSIS — I2699 Other pulmonary embolism without acute cor pulmonale: Secondary | ICD-10-CM | POA: Diagnosis not present

## 2020-03-02 DIAGNOSIS — R350 Frequency of micturition: Secondary | ICD-10-CM | POA: Diagnosis not present

## 2020-03-02 DIAGNOSIS — N390 Urinary tract infection, site not specified: Secondary | ICD-10-CM | POA: Diagnosis not present

## 2020-03-03 DIAGNOSIS — R339 Retention of urine, unspecified: Secondary | ICD-10-CM | POA: Diagnosis not present

## 2020-03-03 DIAGNOSIS — M792 Neuralgia and neuritis, unspecified: Secondary | ICD-10-CM | POA: Diagnosis not present

## 2020-03-03 DIAGNOSIS — N319 Neuromuscular dysfunction of bladder, unspecified: Secondary | ICD-10-CM | POA: Diagnosis not present

## 2020-03-04 DIAGNOSIS — G373 Acute transverse myelitis in demyelinating disease of central nervous system: Secondary | ICD-10-CM | POA: Diagnosis not present

## 2020-03-04 DIAGNOSIS — M6281 Muscle weakness (generalized): Secondary | ICD-10-CM | POA: Diagnosis not present

## 2020-03-04 DIAGNOSIS — F419 Anxiety disorder, unspecified: Secondary | ICD-10-CM | POA: Diagnosis not present

## 2020-03-04 DIAGNOSIS — M4802 Spinal stenosis, cervical region: Secondary | ICD-10-CM | POA: Diagnosis not present

## 2020-03-04 DIAGNOSIS — F0390 Unspecified dementia without behavioral disturbance: Secondary | ICD-10-CM | POA: Diagnosis not present

## 2020-03-04 DIAGNOSIS — R339 Retention of urine, unspecified: Secondary | ICD-10-CM | POA: Diagnosis not present

## 2020-03-04 DIAGNOSIS — M25562 Pain in left knee: Secondary | ICD-10-CM | POA: Diagnosis not present

## 2020-03-04 DIAGNOSIS — S32502D Unspecified fracture of left pubis, subsequent encounter for fracture with routine healing: Secondary | ICD-10-CM | POA: Diagnosis not present

## 2020-03-04 DIAGNOSIS — M25561 Pain in right knee: Secondary | ICD-10-CM | POA: Diagnosis not present

## 2020-03-04 DIAGNOSIS — R262 Difficulty in walking, not elsewhere classified: Secondary | ICD-10-CM | POA: Diagnosis not present

## 2020-03-17 DIAGNOSIS — R339 Retention of urine, unspecified: Secondary | ICD-10-CM | POA: Diagnosis not present

## 2020-03-20 ENCOUNTER — Other Ambulatory Visit: Payer: Self-pay | Admitting: Neurology

## 2020-03-22 ENCOUNTER — Other Ambulatory Visit: Payer: Self-pay | Admitting: Neurology

## 2020-03-22 DIAGNOSIS — R339 Retention of urine, unspecified: Secondary | ICD-10-CM | POA: Diagnosis not present

## 2020-03-22 DIAGNOSIS — N3941 Urge incontinence: Secondary | ICD-10-CM | POA: Diagnosis not present

## 2020-03-22 DIAGNOSIS — N3289 Other specified disorders of bladder: Secondary | ICD-10-CM | POA: Diagnosis not present

## 2020-03-25 DIAGNOSIS — F0390 Unspecified dementia without behavioral disturbance: Secondary | ICD-10-CM | POA: Diagnosis not present

## 2020-03-25 DIAGNOSIS — M4802 Spinal stenosis, cervical region: Secondary | ICD-10-CM | POA: Diagnosis not present

## 2020-03-25 DIAGNOSIS — M6281 Muscle weakness (generalized): Secondary | ICD-10-CM | POA: Diagnosis not present

## 2020-03-25 DIAGNOSIS — S32502D Unspecified fracture of left pubis, subsequent encounter for fracture with routine healing: Secondary | ICD-10-CM | POA: Diagnosis not present

## 2020-03-25 DIAGNOSIS — R339 Retention of urine, unspecified: Secondary | ICD-10-CM | POA: Diagnosis not present

## 2020-03-25 DIAGNOSIS — M25561 Pain in right knee: Secondary | ICD-10-CM | POA: Diagnosis not present

## 2020-03-25 DIAGNOSIS — G373 Acute transverse myelitis in demyelinating disease of central nervous system: Secondary | ICD-10-CM | POA: Diagnosis not present

## 2020-03-25 DIAGNOSIS — Z87440 Personal history of urinary (tract) infections: Secondary | ICD-10-CM | POA: Diagnosis not present

## 2020-03-25 DIAGNOSIS — F419 Anxiety disorder, unspecified: Secondary | ICD-10-CM | POA: Diagnosis not present

## 2020-03-25 DIAGNOSIS — M25562 Pain in left knee: Secondary | ICD-10-CM | POA: Diagnosis not present

## 2020-03-25 DIAGNOSIS — Z8661 Personal history of infections of the central nervous system: Secondary | ICD-10-CM | POA: Diagnosis not present

## 2020-03-25 DIAGNOSIS — R262 Difficulty in walking, not elsewhere classified: Secondary | ICD-10-CM | POA: Diagnosis not present

## 2020-03-28 ENCOUNTER — Telehealth: Payer: Self-pay | Admitting: Neurology

## 2020-03-28 DIAGNOSIS — M792 Neuralgia and neuritis, unspecified: Secondary | ICD-10-CM | POA: Diagnosis not present

## 2020-03-28 DIAGNOSIS — F411 Generalized anxiety disorder: Secondary | ICD-10-CM | POA: Diagnosis not present

## 2020-03-28 DIAGNOSIS — I82502 Chronic embolism and thrombosis of unspecified deep veins of left lower extremity: Secondary | ICD-10-CM | POA: Diagnosis not present

## 2020-03-28 DIAGNOSIS — N390 Urinary tract infection, site not specified: Secondary | ICD-10-CM | POA: Diagnosis not present

## 2020-03-28 DIAGNOSIS — M810 Age-related osteoporosis without current pathological fracture: Secondary | ICD-10-CM | POA: Diagnosis not present

## 2020-03-28 DIAGNOSIS — G8222 Paraplegia, incomplete: Secondary | ICD-10-CM | POA: Diagnosis not present

## 2020-03-28 NOTE — Telephone Encounter (Signed)
Pt's husband, Rokia Bosket; wife taken a turn for the worse. Nerve condition is worsening everyday, can't walk from bed to wheelchair. Husband would like nurse to call him to see if she can be seen sooner. Please advise.

## 2020-03-29 NOTE — Telephone Encounter (Signed)
Called and spoke to husband, he states he only wants to speak with Dr Jannifer [place]  States he would like a call when back in office

## 2020-03-30 DIAGNOSIS — A419 Sepsis, unspecified organism: Secondary | ICD-10-CM | POA: Diagnosis not present

## 2020-03-30 DIAGNOSIS — I2699 Other pulmonary embolism without acute cor pulmonale: Secondary | ICD-10-CM | POA: Diagnosis not present

## 2020-03-30 NOTE — Telephone Encounter (Signed)
I called the husband.  The patient has had a decline in her ability to walk with increasing lower extremity fatigue over the last 2 weeks, particularly worse over the last for 5 days, she has not been able to walk much at all at this point.  Onset was therefore somewhat gradual.  On 01 Feb 2020, she was admitted to the hospital with some decline in her ability to walk, the patient has had MRI of the brain, cervical spine, lumbar spine, and thoracic spine recently.  There was some evidence of spinal stenosis at the C5-6 level, the patient has been seen by Dr. Rolena Infante in the past but not recently for this issue.  The patient was admitted to the hospital on 12 February 2020 with urinary sepsis and was found to have a pulmonary embolism.  She currently is on Xarelto.  The patient has been seen through urology recently, no evidence of recurrent bladder infection was noted.  Blood work will be done by the primary doctor soon.  The patient has no fever or cough.  I will try to get the patient worked in next week.

## 2020-03-30 NOTE — Telephone Encounter (Signed)
Called, appt made 04/07/20 at 730 with Dr. Jannifer [place]

## 2020-04-01 DIAGNOSIS — G373 Acute transverse myelitis in demyelinating disease of central nervous system: Secondary | ICD-10-CM | POA: Diagnosis not present

## 2020-04-04 DIAGNOSIS — M4802 Spinal stenosis, cervical region: Secondary | ICD-10-CM | POA: Diagnosis not present

## 2020-04-04 DIAGNOSIS — M25561 Pain in right knee: Secondary | ICD-10-CM | POA: Diagnosis not present

## 2020-04-04 DIAGNOSIS — R339 Retention of urine, unspecified: Secondary | ICD-10-CM | POA: Diagnosis not present

## 2020-04-04 DIAGNOSIS — M25562 Pain in left knee: Secondary | ICD-10-CM | POA: Diagnosis not present

## 2020-04-04 DIAGNOSIS — M6281 Muscle weakness (generalized): Secondary | ICD-10-CM | POA: Diagnosis not present

## 2020-04-04 DIAGNOSIS — G373 Acute transverse myelitis in demyelinating disease of central nervous system: Secondary | ICD-10-CM | POA: Diagnosis not present

## 2020-04-04 DIAGNOSIS — R262 Difficulty in walking, not elsewhere classified: Secondary | ICD-10-CM | POA: Diagnosis not present

## 2020-04-04 DIAGNOSIS — S32502D Unspecified fracture of left pubis, subsequent encounter for fracture with routine healing: Secondary | ICD-10-CM | POA: Diagnosis not present

## 2020-04-04 DIAGNOSIS — F0390 Unspecified dementia without behavioral disturbance: Secondary | ICD-10-CM | POA: Diagnosis not present

## 2020-04-04 DIAGNOSIS — F419 Anxiety disorder, unspecified: Secondary | ICD-10-CM | POA: Diagnosis not present

## 2020-04-05 ENCOUNTER — Telehealth: Payer: Self-pay | Admitting: Neurology

## 2020-04-05 DIAGNOSIS — A419 Sepsis, unspecified organism: Secondary | ICD-10-CM | POA: Diagnosis not present

## 2020-04-05 DIAGNOSIS — I2699 Other pulmonary embolism without acute cor pulmonale: Secondary | ICD-10-CM | POA: Diagnosis not present

## 2020-04-05 MED ORDER — PREDNISONE 10 MG PO TABS: ORAL_TABLET | ORAL | 0 refills | Status: DC

## 2020-04-05 NOTE — Telephone Encounter (Signed)
I called the husband.  The patient continues to decline her physical abilities, she had some blood work done through the primary care physician but they have not her anything back yet.  There may be some overlying increased confusion as well, the patient is getting weak on all fours, not able to walk, she has had 3 falls in 3 days.  I will give her a trial on a prednisone Dosepak, if they cannot make it to the office in the next couple days for visit, they may have to go to the emergency room for further evaluation.

## 2020-04-05 NOTE — Telephone Encounter (Signed)
Pt's husband called stating that this morning the pt had trouble standing, walking and is loosing her grip so its hard for her to hold on to her walker. He would like to speak to the RN or MD to be advised on what can be done.

## 2020-04-07 ENCOUNTER — Other Ambulatory Visit: Payer: Self-pay

## 2020-04-07 ENCOUNTER — Encounter: Payer: Self-pay | Admitting: Neurology

## 2020-04-07 ENCOUNTER — Ambulatory Visit (INDEPENDENT_AMBULATORY_CARE_PROVIDER_SITE_OTHER): Payer: PPO | Admitting: Neurology

## 2020-04-07 VITALS — BP 116/79 | HR 105

## 2020-04-07 DIAGNOSIS — R413 Other amnesia: Secondary | ICD-10-CM | POA: Diagnosis not present

## 2020-04-07 DIAGNOSIS — R269 Unspecified abnormalities of gait and mobility: Secondary | ICD-10-CM

## 2020-04-07 DIAGNOSIS — N319 Neuromuscular dysfunction of bladder, unspecified: Secondary | ICD-10-CM

## 2020-04-07 DIAGNOSIS — G894 Chronic pain syndrome: Secondary | ICD-10-CM | POA: Diagnosis not present

## 2020-04-07 DIAGNOSIS — R270 Ataxia, unspecified: Secondary | ICD-10-CM | POA: Diagnosis not present

## 2020-04-07 DIAGNOSIS — G0490 Encephalitis and encephalomyelitis, unspecified: Secondary | ICD-10-CM

## 2020-04-07 MED ORDER — DIAZEPAM 2 MG PO TABS: 2.0000 mg | ORAL_TABLET | Freq: Two times a day (BID) | ORAL | 3 refills | Status: DC

## 2020-04-07 NOTE — Progress Notes (Signed)
Reason for visit: Encephalomyelitis  Katelyn Lamb is an 74 y.o. 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  The patient has had significant deficits following this with changes in memory, coordination, and gait stability.  The patient has had recent issues with multiple falls, weakness, and sepsis from urinary tract infections.  The patient was in the hospital on 06 December 2019 following a fall, she went to a rehab facility following this.  She had a urinary tract infection when she was discharged from rehab facility and subsequently required and admission to the hospital for sepsis on 20 Jan 2020.  The patient was treated for this but was readmitted on 01 Feb 2020 with generalized weakness.  At that time, MRI of the brain, cervical spine, thoracic spine, and lumbar spine were done.  There was some evidence of spinal stenosis at the C5-6 level without evidence of spinal cord injury at that level.  The patient was admitted again with urinary sepsis on 12 February 2020.  During that admission she was found to have a pulmonary embolism as well, she was placed on anticoagulation therapy.  The patient has been recovering at home, she has had home health physical therapy that will end today.  Over the last 2 and half weeks she has had a decline in her functional level.  She has had about 5 falls over the last 5 days.  The patient will constantly try to get up and walk on her own and slide down from a chair.  She has a tendency to lean backwards.  She has a Foley catheter in place, she is followed by Dr. Amalia Hailey from urology and may end up getting a suprapubic catheter placement.  The patient has not been eating well, some days she eats very little, she does drink fluids and she has plenty of salt in the diet.  She has been documented to have episodes of low blood pressure.  The patient is also on  diazepam taking 5 mg twice daily every day.  Without the diazepam she tends to have some tremors of the upper extremities.  She has a lot of underlying anxiety issues.  The patient has ongoing discomfort in the legs and knees that is chronic in nature.  She is on Lyrica and Cymbalta.  The patient was placed on a prednisone Dosepak within a couple days prior to this admission which seems to have improved her ability to ambulate dramatically.  The patient has not had any bumps to the head with any of the recent falls.  She comes to this office for further evaluation.  There have been no definite new changes in strength or sensation reported by the patient.  Past Medical History:  Diagnosis Date  . Anxiety   . Back pain   . Gait abnormality 11/14/2016  . Memory difficulty 03/02/2019  . Myelitis due to herpes simplex [facility])     Past Surgical History:  Procedure Laterality Date  . ABDOMINAL HYSTERECTOMY    . BLADDER REPAIR    . CESAREAN SECTION    . IR KYPHO LUMBAR INC FX REDUCE BONE BX UNI/BIL CANNULATION INC/IMAGING  04/11/2018  . TUBAL LIGATION      Family History  Problem Relation Age of Onset  . Hypertension Mother     Social history:  reports that she has quit smoking. She has never used  smokeless tobacco. She reports that she does not drink alcohol and does not use drugs.    Allergies  Allergen Reactions  . Demerol [Meperidine] Other (See Comments)    Hallucinations  . Percocet [Oxycodone-Acetaminophen] Itching  . Amoxicillin-Pot Clavulanate Diarrhea    Severe pain, headache, intestinal infection  . Penicillins Itching and Rash    Has patient had a PCN reaction causing immediate rash, facial/tongue/throat swelling, SOB or lightheadedness with hypotension:  NO Has patient had a PCN reaction causing severe rash involving mucus membranes or skin necrosis: No Has patient had a PCN reaction that required hospitalization: No Has patient had a PCN reaction occurring within the last 10  years: Yes If all of the above answers are "NO", then may proceed with Cephalosporin use.    Medications:  Prior to Admission medications   Medication Sig Start Date End Date Taking? Authorizing Provider  acyclovir (ZOVIRAX) 400 MG tablet TAKE 1 TABLET BY MOUTH TWICE A DAY Patient taking differently: Take 400 mg by mouth 2 (two) times daily.  12/29/19  Yes [name], MD  diazepam (VALIUM) 5 MG tablet Take 1 tablet (5 mg total) by mouth every 12 (twelve) hours as needed for anxiety. Patient taking differently: Take 5 mg by mouth in the morning and at bedtime.  02/06/20  Yes Swayze, Ava, DO  diclofenac Sodium (VOLTAREN) 1 % GEL Apply 1 application topically daily as needed (pain).   Yes [provider]  diphenoxylate-atropine (LOMOTIL) 2.5-0.025 MG tablet Take 1 tablet by mouth 4 (four) times daily as needed for diarrhea or loose stools.   Yes [provider]  DULoxetine (CYMBALTA) 30 MG capsule TAKE 1 CAPSULE BY MOUTH EVERY DAY Patient taking differently: Take 30 mg by mouth every morning.  09/07/19  Yes [name], MD  DULoxetine (CYMBALTA) 60 MG capsule Take 1 capsule (60 mg total) by mouth at bedtime. 12/14/19  Yes [name], MD  HYDROcodone-acetaminophen [facility]) 10-325 MG tablet Take 1 tablet by mouth every 6 (six) hours as needed for pain. 03/07/18  Yes [provider]  Multiple Vitamins-Minerals (HAIR/SKIN/NAILS/BIOTIN) TABS Take 1 tablet by mouth daily.   Yes [provider]  nitrofurantoin, macrocrystal-monohydrate, (MACROBID) 100 MG capsule Take 1 capsule (100 mg total) by mouth at bedtime. 02/17/20  Yes Cherene Altes, MD  omega-3 acid ethyl esters (LOVAZA) 1 g capsule Take 1 g by mouth daily.   Yes [provider]  pantoprazole (PROTONIX) 40 MG tablet Take 1 tablet (40 mg total) by mouth daily. Patient taking differently: Take 40 mg by mouth at bedtime.  10/26/16  Yes Angiulli, Lavon Paganini, PA-C  polyethylene glycol (MIRALAX /  GLYCOLAX) packet Take 17 g by mouth daily. Patient taking differently: Take 17 g by mouth daily as needed (constipation).  10/26/16  Yes Angiulli, Lavon Paganini, PA-C  predniSONE (DELTASONE) 10 MG tablet Begin taking 6 tablets daily, taper by one tablet daily until off the medication. 04/05/20  Yes [name], MD  pregabalin (LYRICA) 50 MG capsule Take 100 mg by mouth at bedtime. 11/16/19  Yes [provider]  QUEtiapine (SEROQUEL) 25 MG tablet Take 1 tablet (25 mg total) by mouth at bedtime. Patient taking differently: Take 25 mg by mouth at bedtime as needed (sleep aid).  11/16/19  Yes [name], MD  rivaroxaban (XARELTO) 20 MG TABS tablet Take 1 tablet (20 mg total) by mouth daily with supper. 03/06/20  Yes Cherene Altes, MD    ROS:  Out of a complete 14 system review of symptoms, the patient complains only of the following symptoms, and all other reviewed systems are negative.  Walking difficulty Memory problems, confusion Leg pain  Blood pressure 116/79, pulse (!) 105.  Physical Exam  General: The patient is alert and cooperative at the time of the examination.  Skin: No significant peripheral edema is noted.   Neurologic Exam  Mental status: The patient is alert and oriented x 3 at the time of the examination. The patient has apparent normal recent and remote memory, with an apparently normal attention span and concentration ability.   Cranial nerves: Facial symmetry is present. Speech is normal, no aphasia or dysarthria is noted. Extraocular movements are full. Visual fields are full.  Motor: The patient has good strength in all 4 extremities.  Sensory examination: Soft touch sensation is symmetric on the face, arms, and legs.  Coordination: The patient has mild dysmetria with finger-nose-finger bilaterally and with heel-to-shin bilaterally.  Gait and station: The patient is able to stand with assistance.  She has a tendency to lean backwards.  She can  take a few steps with examiner with a wide-based stance.  Reflexes: Deep tendon reflexes are symmetric.   MRI cervical 02/02/20:  IMPRESSION: 1. Unchanged examination with severe spinal canal stenosis at C5-6 with indentation of the ventral aspect of the spinal cord. 2. Hyperintense T2-weighted signal within the dorsal spinal cord at the C3-4 levels, unchanged from the prior study and likely a sequela of transverse myelitis. 3. Unchanged mild spinal canal stenosis at C4-5 and C6-7. 4. Unchanged severe bilateral C5-6 and moderate bilateral C4-5 and C6-7 neural foraminal stenosis.  * MRI scan images were reviewed online. I agree with the written report.   MRI brain 02/02/20:  IMPRESSION: 1. No acute intracranial abnormality. 2. Moderate chronic small vessel disease.  * MRI scan images were reviewed online. I agree with the written report.   MRI thoracic 02/04/20:  IMPRESSION: 1. No acute abnormality. 2. Decreased intensity of multifocal T2 signal abnormality within the thoracic cord without associated enhancement, consistent with sequelae of prior myelitis. No new cord lesion.   MRI lumbar 02/01/20:  IMPRESSION: 1. Mild spinal canal stenosis and moderate bilateral neural foraminal stenosis at L4-L5 secondary to grade 1 anterolisthesis caused by severe facet arthrosis. 2. Left lateral recess narrowing at L3-L4. Correlate for L4 radiculopathy. 3. Partial involution of left subarticular disc protrusion at L1-L2. No associated stenosis   Assessment/Plan:  1.  History of encephalomyelitis  2.  Recent decline in functional level  3.  Neurogenic bladder, recurrent episodes of urinary sepsis  4.  Recent pulmonary embolism on anticoagulation  The patient has gained benefit from prednisone, we will continue the taper.  Unfortunately, physical therapy will be ending today.  The patient is on diazepam taking 5 mg twice daily, this may have long-acting metabolites that may  impair cognitive abilities and increased ataxia and gait instability.  We will reduce the dose to 2 mg twice daily.  The patient may be brought back in the future for an a.m. cortisol level if the physical abilities decline after the prednisone is tapered off. A CT of the head may also be considered, as the patient has fallen on anticoagulant therapy. The patient is to try to improve her diet, while eating better and drinking plenty of fluids.  She will follow-up here in 4 months.  The patient did have recent blood work done through the primary care physician, the results are  not available to me.  Jill Alexanders MD 04/07/2020 7:52 AM  [facility] [address] [place], [place] [address]  Phone [phone] Fax [phone]

## 2020-04-07 NOTE — Patient Instructions (Signed)
We will reduce the diazepam to 2 mg twice a day.

## 2020-04-11 DIAGNOSIS — D6869 Other thrombophilia: Secondary | ICD-10-CM | POA: Diagnosis not present

## 2020-04-11 DIAGNOSIS — Z86718 Personal history of other venous thrombosis and embolism: Secondary | ICD-10-CM | POA: Diagnosis not present

## 2020-04-12 DIAGNOSIS — I2699 Other pulmonary embolism without acute cor pulmonale: Secondary | ICD-10-CM | POA: Diagnosis not present

## 2020-04-12 DIAGNOSIS — A419 Sepsis, unspecified organism: Secondary | ICD-10-CM | POA: Diagnosis not present

## 2020-04-19 DIAGNOSIS — I2699 Other pulmonary embolism without acute cor pulmonale: Secondary | ICD-10-CM | POA: Diagnosis not present

## 2020-04-19 DIAGNOSIS — A419 Sepsis, unspecified organism: Secondary | ICD-10-CM | POA: Diagnosis not present

## 2020-04-21 DIAGNOSIS — N319 Neuromuscular dysfunction of bladder, unspecified: Secondary | ICD-10-CM | POA: Diagnosis not present

## 2020-04-21 DIAGNOSIS — R339 Retention of urine, unspecified: Secondary | ICD-10-CM | POA: Diagnosis not present

## 2020-04-26 DIAGNOSIS — A419 Sepsis, unspecified organism: Secondary | ICD-10-CM | POA: Diagnosis not present

## 2020-04-26 DIAGNOSIS — I2699 Other pulmonary embolism without acute cor pulmonale: Secondary | ICD-10-CM | POA: Diagnosis not present

## 2020-04-28 ENCOUNTER — Other Ambulatory Visit: Payer: Self-pay | Admitting: Neurology

## 2020-04-30 ENCOUNTER — Other Ambulatory Visit: Payer: Self-pay | Admitting: Neurology

## 2020-04-30 DIAGNOSIS — G9340 Encephalopathy, unspecified: Secondary | ICD-10-CM

## 2020-04-30 DIAGNOSIS — G0490 Encephalitis and encephalomyelitis, unspecified: Secondary | ICD-10-CM

## 2020-05-03 DIAGNOSIS — A419 Sepsis, unspecified organism: Secondary | ICD-10-CM | POA: Diagnosis not present

## 2020-05-03 DIAGNOSIS — I2699 Other pulmonary embolism without acute cor pulmonale: Secondary | ICD-10-CM | POA: Diagnosis not present

## 2020-05-18 DIAGNOSIS — N319 Neuromuscular dysfunction of bladder, unspecified: Secondary | ICD-10-CM | POA: Diagnosis not present

## 2020-05-18 DIAGNOSIS — R339 Retention of urine, unspecified: Secondary | ICD-10-CM | POA: Diagnosis not present

## 2020-05-18 DIAGNOSIS — Z466 Encounter for fitting and adjustment of urinary device: Secondary | ICD-10-CM | POA: Diagnosis not present

## 2020-05-18 DIAGNOSIS — R310 Gross hematuria: Secondary | ICD-10-CM | POA: Diagnosis not present

## 2020-05-18 DIAGNOSIS — R82998 Other abnormal findings in urine: Secondary | ICD-10-CM | POA: Diagnosis not present

## 2020-05-23 DIAGNOSIS — R339 Retention of urine, unspecified: Secondary | ICD-10-CM | POA: Diagnosis not present

## 2020-05-23 DIAGNOSIS — R3981 Functional urinary incontinence: Secondary | ICD-10-CM | POA: Diagnosis not present

## 2020-05-23 DIAGNOSIS — N319 Neuromuscular dysfunction of bladder, unspecified: Secondary | ICD-10-CM | POA: Diagnosis not present

## 2020-05-23 DIAGNOSIS — R32 Unspecified urinary incontinence: Secondary | ICD-10-CM | POA: Diagnosis not present

## 2020-05-25 ENCOUNTER — Other Ambulatory Visit: Payer: Self-pay | Admitting: Neurology

## 2020-06-02 ENCOUNTER — Ambulatory Visit: Payer: PPO | Admitting: Neurology

## 2020-07-05 DIAGNOSIS — N3289 Other specified disorders of bladder: Secondary | ICD-10-CM | POA: Diagnosis not present

## 2020-07-05 DIAGNOSIS — N318 Other neuromuscular dysfunction of bladder: Secondary | ICD-10-CM | POA: Diagnosis not present

## 2020-07-05 DIAGNOSIS — R338 Other retention of urine: Secondary | ICD-10-CM | POA: Diagnosis not present

## 2020-07-05 DIAGNOSIS — Z960 Presence of urogenital implants: Secondary | ICD-10-CM | POA: Diagnosis not present

## 2020-07-12 DIAGNOSIS — M5136 Other intervertebral disc degeneration, lumbar region: Secondary | ICD-10-CM | POA: Diagnosis not present

## 2020-07-30 DIAGNOSIS — R319 Hematuria, unspecified: Secondary | ICD-10-CM | POA: Diagnosis not present

## 2020-07-30 DIAGNOSIS — Z9359 Other cystostomy status: Secondary | ICD-10-CM | POA: Diagnosis not present

## 2020-07-30 DIAGNOSIS — T83510A Infection and inflammatory reaction due to cystostomy catheter, initial encounter: Secondary | ICD-10-CM | POA: Diagnosis not present

## 2020-07-30 DIAGNOSIS — N390 Urinary tract infection, site not specified: Secondary | ICD-10-CM | POA: Diagnosis not present

## 2020-08-04 DIAGNOSIS — N319 Neuromuscular dysfunction of bladder, unspecified: Secondary | ICD-10-CM | POA: Diagnosis not present

## 2020-08-10 ENCOUNTER — Ambulatory Visit (INDEPENDENT_AMBULATORY_CARE_PROVIDER_SITE_OTHER): Payer: PPO | Admitting: Neurology

## 2020-08-10 ENCOUNTER — Encounter: Payer: Self-pay | Admitting: Neurology

## 2020-08-10 ENCOUNTER — Other Ambulatory Visit: Payer: Self-pay

## 2020-08-10 VITALS — BP 98/66 | HR 93 | Ht 65.0 in | Wt 150.0 lb

## 2020-08-10 DIAGNOSIS — G894 Chronic pain syndrome: Secondary | ICD-10-CM | POA: Diagnosis not present

## 2020-08-10 DIAGNOSIS — R269 Unspecified abnormalities of gait and mobility: Secondary | ICD-10-CM

## 2020-08-10 DIAGNOSIS — G0490 Encephalitis and encephalomyelitis, unspecified: Secondary | ICD-10-CM | POA: Diagnosis not present

## 2020-08-10 MED ORDER — DIAZEPAM 5 MG PO TABS: 5.0000 mg | ORAL_TABLET | Freq: Two times a day (BID) | ORAL | 1 refills | Status: DC

## 2020-08-10 NOTE — Progress Notes (Signed)
Reason for visit: Encephalomyelitis, gait disorder  Katelyn Lamb is an 74 y.o. female  History of present illness:  Katelyn Lamb is a 74 year old right-handed white female with a history of a prior post viral encephalomyelitis.  The patient has been left with residual balance issues and some cognitive issues and chronic pain.  The patient is on Cymbalta taking 30 mg in the morning and 60 mg in the evening and she takes Lyrica 50 mg twice daily.  Due to concerns about balance, the diazepam was cut back from 5 mg twice daily to 2 mg twice daily but the husband believes that on the lower dose she has more difficulty controlling her hands.  The patient is walking with a walker, she has not had any falls.  The patient has had a suprapubic catheter placed in the bladder which has helped, she has not had any significant recurrent urinary tract infections.  She returns this office for an evaluation.  Past Medical History:  Diagnosis Date  . Anxiety   . Back pain   . Gait abnormality 11/14/2016  . Memory difficulty 03/02/2019  . Myelitis due to herpes simplex [facility])     Past Surgical History:  Procedure Laterality Date  . ABDOMINAL HYSTERECTOMY    . BLADDER REPAIR    . CESAREAN SECTION    . IR KYPHO LUMBAR INC FX REDUCE BONE BX UNI/BIL CANNULATION INC/IMAGING  04/11/2018  . TUBAL LIGATION      Family History  Problem Relation Age of Onset  . Hypertension Mother     Social history:  reports that she has quit smoking. She has never used smokeless tobacco. She reports that she does not drink alcohol and does not use drugs.    Allergies  Allergen Reactions  . Demerol [Meperidine] Other (See Comments)    Hallucinations  . Percocet [Oxycodone-Acetaminophen] Itching  . Amoxicillin-Pot Clavulanate Diarrhea    Severe pain, headache, intestinal infection  . Penicillins Itching and Rash    Has patient had a PCN reaction causing immediate rash, facial/tongue/throat swelling, SOB or lightheadedness  with hypotension:  NO Has patient had a PCN reaction causing severe rash involving mucus membranes or skin necrosis: No Has patient had a PCN reaction that required hospitalization: No Has patient had a PCN reaction occurring within the last 10 years: Yes If all of the above answers are "NO", then may proceed with Cephalosporin use.    Medications:  Prior to Admission medications   Medication Sig Start Date End Date Taking? Authorizing Provider  acyclovir (ZOVIRAX) 400 MG tablet TAKE 1 TABLET BY MOUTH TWICE A DAY Patient taking differently: Take 400 mg by mouth 2 (two) times daily.  12/29/19  Yes [name], MD  diazepam (VALIUM) 2 MG tablet Take 1 tablet (2 mg total) by mouth in the morning and at bedtime. 04/07/20  Yes [name], MD  diclofenac Sodium (VOLTAREN) 1 % GEL Apply 1 application topically daily as needed (pain).   Yes [provider]  diphenoxylate-atropine (LOMOTIL) 2.5-0.025 MG tablet Take 1 tablet by mouth 4 (four) times daily as needed for diarrhea or loose stools.   Yes [provider]  DULoxetine (CYMBALTA) 30 MG capsule TAKE 1 CAPSULE BY MOUTH EVERY DAY IN THE MORNING 05/25/20  Yes [name], MD  DULoxetine (CYMBALTA) 60 MG capsule Take 1 capsule (60 mg total) by mouth at bedtime. 12/14/19  Yes [name], MD  HYDROcodone-acetaminophen [facility]) 10-325 MG tablet Take 1 tablet  by mouth every 6 (six) hours as needed for pain. 03/07/18  Yes [provider]  Multiple Vitamins-Minerals (HAIR/SKIN/NAILS/BIOTIN) TABS Take 1 tablet by mouth daily.   Yes [provider]  omega-3 acid ethyl esters (LOVAZA) 1 g capsule Take 1 g by mouth daily.   Yes [provider]  pantoprazole (PROTONIX) 40 MG tablet Take 1 tablet (40 mg total) by mouth daily. Patient taking differently: Take 40 mg by mouth at bedtime.  10/26/16  Yes Angiulli, Lavon Paganini, PA-C  polyethylene glycol (MIRALAX / GLYCOLAX) packet Take 17 g by mouth  daily. Patient taking differently: Take 17 g by mouth daily as needed (constipation).  10/26/16  Yes Angiulli, Lavon Paganini, PA-C  pregabalin (LYRICA) 50 MG capsule TAKE 1 CAPSULE BY MOUTH IN THE MORNING AND 2 CAPSULES IN THE EVENING. 05/02/20  Yes [name], MD  QUEtiapine (SEROQUEL) 25 MG tablet Take 1 tablet (25 mg total) by mouth at bedtime. Patient taking differently: Take 25 mg by mouth at bedtime as needed (sleep aid).  11/16/19  Yes [name], MD  rivaroxaban (XARELTO) 20 MG TABS tablet Take 1 tablet (20 mg total) by mouth daily with supper. 03/06/20  Yes Cherene Altes, MD  nitrofurantoin, macrocrystal-monohydrate, (MACROBID) 100 MG capsule Take 1 capsule (100 mg total) by mouth at bedtime. 02/17/20   Cherene Altes, MD  predniSONE (DELTASONE) 10 MG tablet Begin taking 6 tablets daily, taper by one tablet daily until off the medication. 04/05/20   [name], MD    ROS:  Out of a complete 14 system review of symptoms, the patient complains only of the following symptoms, and all other reviewed systems are negative.  Walking difficulty Foot and leg discomfort  Blood pressure 98/66, pulse 93, height 5\' 5"  (1.651 m), weight 150 lb (68 kg).  Physical Exam  General: The patient is alert and cooperative at the time of the examination.  Skin: No significant peripheral edema is noted.   Neurologic Exam  Mental status: The patient is alert and oriented x 3 at the time of the examination. The patient has apparent normal recent and remote memory, with an apparently normal attention span and concentration ability.   Cranial nerves: Facial symmetry is present. Speech is normal, no aphasia or dysarthria is noted. Extraocular movements are full. Visual fields are full.  Motor: The patient has good strength in all 4 extremities.  Sensory examination: Soft touch sensation is symmetric on the face, arms, and legs.  Coordination: The patient has good finger-nose-finger  and heel-to-shin bilaterally.  Gait and station: The patient is able to stand with some assistance, the patient is able to walk with examiner, gait is wide-based.  Tandem gait was not attempted.  Romberg is negative but is unsteady.  Reflexes: Deep tendon reflexes are symmetric.   Assessment/Plan:  1.  History of encephalomyelitis  2.  Gait disorder  3.  Chronic pain syndrome  The patient will be increased on the Lyrica taking 50 mg in the morning and 100 mg in the evening.  The patient and her husband believe that she functioned better with the 5 mg dose of the diazepam, I will go ahead and go up on this dose, but they need to look out for worsening gait instability.  The patient otherwise will follow up in 6 months.  Jill Alexanders MD 08/10/2020 2:10 PM  [facility] [address] [place], [place] [address]  Phone [phone] Fax [phone]

## 2020-08-15 ENCOUNTER — Telehealth: Payer: Self-pay | Admitting: Neurology

## 2020-08-15 DIAGNOSIS — G0490 Encephalitis and encephalomyelitis, unspecified: Secondary | ICD-10-CM

## 2020-08-15 DIAGNOSIS — G9340 Encephalopathy, unspecified: Secondary | ICD-10-CM

## 2020-08-15 MED ORDER — PREGABALIN 50 MG PO CAPS: 100.0000 mg | ORAL_CAPSULE | Freq: Every day | ORAL | 1 refills | Status: DC

## 2020-08-15 NOTE — Telephone Encounter (Signed)
Unable to reach, left message for return call.

## 2020-08-15 NOTE — Addendum Note (Signed)
Addended by: [name] on: 08/15/2020 04:46 PM   Modules accepted: Orders

## 2020-08-15 NOTE — Telephone Encounter (Signed)
Pt's husband, [name] Nasca to return call. Would like a call back.

## 2020-08-15 NOTE — Telephone Encounter (Signed)
The patient had not yet gone up on the diazepam dose, but did go up on the Lyrica dose taking 50 mg in the morning and 100 mg in the evening, she had some difficulty getting into the bathroom, legs have a tendency to collapse.  He will go back to taking 100 mg in the evening and stop the 50 mg a.m. dose.  Once again I will have to look out for balance changes when they go up on the diazepam dose.

## 2020-08-15 NOTE — Telephone Encounter (Signed)
Pt's husband called needing to speak to the provider regarding the pt's pregabalin (LYRICA) 50 MG capsule Husband states that apparently it is affecting the pt's balance. Please advise.

## 2020-08-21 ENCOUNTER — Other Ambulatory Visit: Payer: Self-pay

## 2020-08-21 ENCOUNTER — Emergency Department (HOSPITAL_BASED_OUTPATIENT_CLINIC_OR_DEPARTMENT_OTHER)
Admission: EM | Admit: 2020-08-21 | Discharge: 2020-08-21 | Disposition: A | Discharge status: Home / Self Care | Payer: PPO | Attending: Emergency Medicine | Admitting: Emergency Medicine

## 2020-08-21 ENCOUNTER — Emergency Department (HOSPITAL_COMMUNITY)
Admission: EM | Admit: 2020-08-21 | Discharge: 2020-08-21 | Disposition: A | Discharge status: Other Acute Inpatient Hospital | Payer: PPO

## 2020-08-21 ENCOUNTER — Encounter (HOSPITAL_BASED_OUTPATIENT_CLINIC_OR_DEPARTMENT_OTHER): Payer: Self-pay | Admitting: Emergency Medicine

## 2020-08-21 DIAGNOSIS — R32 Unspecified urinary incontinence: Secondary | ICD-10-CM | POA: Insufficient documentation

## 2020-08-21 DIAGNOSIS — Z87891 Personal history of nicotine dependence: Secondary | ICD-10-CM | POA: Insufficient documentation

## 2020-08-21 DIAGNOSIS — R69 Illness, unspecified: Secondary | ICD-10-CM | POA: Diagnosis not present

## 2020-08-21 DIAGNOSIS — T83091A Other mechanical complication of indwelling urethral catheter, initial encounter: Secondary | ICD-10-CM | POA: Diagnosis not present

## 2020-08-21 DIAGNOSIS — T83010A Breakdown (mechanical) of cystostomy catheter, initial encounter: Secondary | ICD-10-CM

## 2020-08-21 DIAGNOSIS — R5381 Other malaise: Secondary | ICD-10-CM | POA: Diagnosis not present

## 2020-08-21 DIAGNOSIS — T83198A Other mechanical complication of other urinary devices and implants, initial encounter: Secondary | ICD-10-CM | POA: Diagnosis not present

## 2020-08-21 DIAGNOSIS — Z7901 Long term (current) use of anticoagulants: Secondary | ICD-10-CM | POA: Insufficient documentation

## 2020-08-21 NOTE — ED Notes (Signed)
ED Provider at bedside. 

## 2020-08-21 NOTE — ED Provider Notes (Signed)
[place] EMERGENCY DEPARTMENT Provider Note   CSN: [id number] Arrival date & time: 08/21/20  1145     History Chief Complaint  Patient presents with  . Foley Catheter out    Katelyn Lamb is a 74 y.o. female with multiple medical comorbidities as listed below including indwelling suprapubic catheter who presents to the emergency department due to concern of her catheter falling out last night.  Patient and her husband provide history, her husband states that over the past couple of days they had noted urine on the bed sheets, but did not see that the catheter was out, however last night definitively realized it was not in.  She is still having some urine leakage.  She is not in significant pain at this time.  No alleviating or aggravating factors to her symptoms.  She has had her suprapubic catheter for about 3 months, was most recently changed 10 days ago, utilizes a 60 [place].  Denies fever, vomiting, hematuria, dysuria, or abdominal pain.  HPI     Past Medical History:  Diagnosis Date  . Anxiety   . Back pain   . Gait abnormality 11/14/2016  . Memory difficulty 03/02/2019  . Myelitis due to herpes simplex [facility])     Patient Active Problem List   Diagnosis Date Noted  . Right pulmonary embolus ([place]) 02/12/2020  . Lower extremity weakness 02/02/2020  . Macrocytic anemia 02/02/2020  . Spinal stenosis 02/02/2020  . History of recurrent UTI (urinary tract infection)   . Bacterial infection due to Klebsiella pneumoniae   . Laceration of left hand 12/09/2019  . DNR (do not resuscitate) 12/09/2019  . Pelvic fracture ([place]) 12/06/2019  . AKI (acute kidney injury) ([place]) 12/06/2019  . Hematuria 07/23/2019  . Hypokalemia 07/23/2019  . Acute on chronic anemia 07/23/2019  . Fever 07/23/2019  . Leukocytosis 07/23/2019  . Sepsis ([place]) 07/23/2019  . Generalized anxiety disorder 06/17/2019  . Menopausal sweats 06/17/2019  . Memory difficulty 03/02/2019  . Degenerative  spondylolisthesis 09/02/2018  . Delirium 03/26/2018  . Degeneration of lumbar intervertebral disc 11/08/2017  . Lumbar radiculopathy 11/06/2017  . Chronic neck pain 09/12/2017  . Chronic low back pain 09/06/2017  . Chronic pain syndrome 09/06/2017  . Dilated pancreatic duct 01/24/2017  . DVT, lower extremity, distal, chronic ([place]) 01/22/2017  . Elevated serum GGT level 01/22/2017  . Medication monitoring encounter 01/08/2017  . Gait abnormality 11/14/2016  . Hypotension due to drugs   . Urinary retention   . Anxiety about health   . Reactive depression   . Ataxia   . Abdominal spasms   . Constipation due to pain medication   . Acute lower UTI   . Dysuria   . Acute deep vein thrombosis (DVT) of popliteal vein of left lower extremity ([place])   . Incomplete paraplegia ([place])   . Acute blood loss anemia   . Neurogenic bladder   . Neuropathic pain   . Muscle spasm   . Gastroesophageal reflux disease   . Slow transit constipation   . Thrombocytopenia ([place]) 10/03/2016  . Abnormal MRI, spinal cord   . Encephalomyelitis   . Numbness   . Intractable back pain 09/20/2016  . Numbness of left lower extremity 09/20/2016  . Hyponatremia 09/20/2016  . Herpes zoster without complication 04/16/4817  . Spondylosis of cervical region without myelopathy or radiculopathy 06/16/2015    Past Surgical History:  Procedure Laterality Date  . ABDOMINAL HYSTERECTOMY    . BLADDER REPAIR    .  CESAREAN SECTION    . IR KYPHO LUMBAR INC FX REDUCE BONE BX UNI/BIL CANNULATION INC/IMAGING  04/11/2018  . TUBAL LIGATION       OB History   No obstetric history on file.     Family History  Problem Relation Age of Onset  . Hypertension Mother     Social History   Tobacco Use  . Smoking status: Former Research scientist (life sciences)  . Smokeless tobacco: Never Used  . Tobacco comment: 40 years ago   Vaping Use  . Vaping Use: Never used  Substance Use Topics  . Alcohol use: No  . Drug use: No    Home  Medications Prior to Admission medications   Medication Sig Start Date End Date Taking? Authorizing Provider  acyclovir (ZOVIRAX) 400 MG tablet TAKE 1 TABLET BY MOUTH TWICE A DAY Patient taking differently: Take 400 mg by mouth 2 (two) times daily.  12/29/19   [name], MD  diazepam (VALIUM) 5 MG tablet Take 1 tablet (5 mg total) by mouth in the morning and at bedtime. 08/10/20   [name], MD  diclofenac Sodium (VOLTAREN) 1 % GEL Apply 1 application topically daily as needed (pain).    [provider]  diphenoxylate-atropine (LOMOTIL) 2.5-0.025 MG tablet Take 1 tablet by mouth 4 (four) times daily as needed for diarrhea or loose stools.    [provider]  DULoxetine (CYMBALTA) 30 MG capsule TAKE 1 CAPSULE BY MOUTH EVERY DAY IN THE MORNING 05/25/20   [name], MD  DULoxetine (CYMBALTA) 60 MG capsule Take 1 capsule (60 mg total) by mouth at bedtime. 12/14/19   [name], MD  HYDROcodone-acetaminophen [facility]) 10-325 MG tablet Take 1 tablet by mouth every 6 (six) hours as needed for pain. 03/07/18   [provider]  Multiple Vitamins-Minerals (HAIR/SKIN/NAILS/BIOTIN) TABS Take 1 tablet by mouth daily.    [provider]  omega-3 acid ethyl esters (LOVAZA) 1 g capsule Take 1 g by mouth daily.    [provider]  pantoprazole (PROTONIX) 40 MG tablet Take 1 tablet (40 mg total) by mouth daily. Patient taking differently: Take 40 mg by mouth at bedtime.  10/26/16   Angiulli, Lavon Paganini, PA-C  polyethylene glycol (MIRALAX / GLYCOLAX) packet Take 17 g by mouth daily. Patient taking differently: Take 17 g by mouth daily as needed (constipation).  10/26/16   Angiulli, Lavon Paganini, PA-C  pregabalin (LYRICA) 50 MG capsule Take 2 capsules (100 mg total) by mouth at bedtime. 08/15/20   [name], MD  QUEtiapine (SEROQUEL) 25 MG tablet Take 1 tablet (25 mg total) by mouth at bedtime. Patient taking differently: Take 25 mg by mouth at  bedtime as needed (sleep aid).  11/16/19   [name], MD  rivaroxaban (XARELTO) 20 MG TABS tablet Take 1 tablet (20 mg total) by mouth daily with supper. 03/06/20   Cherene Altes, MD    Allergies    Demerol [meperidine], Percocet [oxycodone-acetaminophen], Amoxicillin-pot clavulanate, and Penicillins  Review of Systems   Review of Systems  Constitutional: Negative for chills and fever.  Respiratory: Negative for cough and shortness of breath.   Gastrointestinal: Negative for abdominal pain and vomiting.  Genitourinary: Positive for difficulty urinating. Negative for dysuria and hematuria.  All other systems reviewed and are negative.   Physical Exam Updated Vital Signs BP 98/71 (BP Location: Left Arm)   Pulse 96   Temp 98.9 F (37.2 C) (Oral)   Resp 18   Ht  5\' 5"  (1.651 m)   Wt 68 kg   SpO2 97%   BMI 24.96 kg/m   Physical Exam Vitals and nursing note reviewed.  Constitutional:      General: She is not in acute distress.    Appearance: She is well-developed and well-nourished. She is not toxic-appearing.  HENT:     Head: Normocephalic and atraumatic.  Eyes:     General:        Right eye: No discharge.        Left eye: No discharge.     Conjunctiva/sclera: Conjunctivae normal.  Cardiovascular:     Rate and Rhythm: Normal rate and regular rhythm.  Pulmonary:     Effort: Pulmonary effort is normal. No respiratory distress.     Breath sounds: Normal breath sounds. No wheezing, rhonchi or rales.     Comments: Respiration even and unlabored Abdominal:     General: There is no distension.     Palpations: Abdomen is soft.     Tenderness: There is no abdominal tenderness. There is no right CVA tenderness, left CVA tenderness, guarding or rebound.     Comments: Suprapubic ostomy present.  No significant erythema, purulent drainage, or active bleeding.  Musculoskeletal:     Cervical back: Neck supple.  Skin:    General: Skin is warm and dry.     Findings: No  rash.  Neurological:     Mental Status: She is alert.     Comments: Clear speech.   Psychiatric:        Mood and Affect: Mood and affect normal.        Behavior: Behavior normal.     ED Results / Procedures / Treatments   Labs (all labs ordered are listed, but only abnormal results are displayed) Labs Reviewed - No data to display  EKG None  Radiology No results found.  Procedures BLADDER CATHETERIZATION  Date/Time: 08/21/2020 4:07 PM Performed by: Amaryllis Dyke, PA-C Authorized by: Amaryllis Dyke, PA-C   Consent:    Consent obtained:  Verbal   Consent given by:  Patient and spouse   Risks, benefits, and alternatives were discussed: yes     Risks discussed:  False passage, incomplete procedure, pain, infection and urethral injury   Alternatives discussed:  No treatment Pre-procedure details:    Procedure purpose:  Therapeutic   Preparation: Patient was prepped and draped in usual sterile fashion   Anesthesia:    Anesthesia method:  None Procedure details:    Procedure performed by provider due to: Suprapubic catheter.   Catheter type:  Foley   Catheter size:  14 Fr   Number of attempts:  3 (Initially attempted catheter insertion with 78 [place], subsequently 20 [place], and ultimately was able to place a 14 [place].)   Urine characteristics:  Yellow Post-procedure details:    Procedure completion:  Tolerated well, no immediate complications   (including critical care time)  Medications Ordered in ED Medications - No data to display  ED Course  I have reviewed the triage vital signs and the nursing notes.  Pertinent labs & imaging results that were available during my care of the patient were reviewed by me and considered in my medical decision making (see chart for details).    MDM Rules/Calculators/A&P                          Patient presents to the emergency department due to her suprapubic catheter coming out.  She is nontoxic, resting  comfortably, vitals without significant abnormality.  Abdomen is nontender without peritoneal signs.  Ultimately was able to insert a 14 French catheter. No sxs to raise concern for UTI. Will discharge home at this time. I discussed treatment plan, need for follow-up, and return precautions with the patient & her husband. Provided opportunity for questions, patient & her husband confirmed understanding and are in agreement with plan.   Findings and plan of care discussed with supervising physician Dr. Rex Kras who has evaluated the patient as a shared visit and is in agreement.   Final Clinical Impression(s) / ED Diagnoses Final diagnoses:  Suprapubic catheter dysfunction, initial encounter [facility])    Rx / DC Orders ED Discharge Orders    None       Amaryllis Dyke, PA-C 08/21/20 1611    Little, Wenda Overland, MD 08/21/20 1826

## 2020-08-21 NOTE — Discharge Instructions (Addendum)
You were seen in the emergency department and had your suprapubic catheter replaced.  Please call your urology office first thing tomorrow morning to inform them of your ER visit and that we have placed a somewhat smaller catheter size with a 14 [place].  Please follow-up in clinic within 3 days.  Return to the ER for new or worsening symptoms including but not limited to urine leakage, catheter coming out, fever, blood in your urine, pain with urination, or any other concerns.

## 2020-08-21 NOTE — ED Triage Notes (Signed)
Pt reports foley catheter came out last night. Pt has been able to urinate today.

## 2020-09-03 ENCOUNTER — Other Ambulatory Visit: Payer: Self-pay | Admitting: Neurology

## 2020-09-03 DIAGNOSIS — G9340 Encephalopathy, unspecified: Secondary | ICD-10-CM

## 2020-09-03 DIAGNOSIS — G0490 Encephalitis and encephalomyelitis, unspecified: Secondary | ICD-10-CM

## 2020-09-03 DIAGNOSIS — R413 Other amnesia: Secondary | ICD-10-CM

## 2020-09-03 DIAGNOSIS — R269 Unspecified abnormalities of gait and mobility: Secondary | ICD-10-CM

## 2020-09-03 DIAGNOSIS — M792 Neuralgia and neuritis, unspecified: Secondary | ICD-10-CM

## 2020-09-08 DIAGNOSIS — Z961 Presence of intraocular lens: Secondary | ICD-10-CM | POA: Diagnosis not present

## 2020-09-08 DIAGNOSIS — H353190 Nonexudative age-related macular degeneration, unspecified eye, stage unspecified: Secondary | ICD-10-CM | POA: Diagnosis not present

## 2020-09-08 DIAGNOSIS — Z466 Encounter for fitting and adjustment of urinary device: Secondary | ICD-10-CM | POA: Diagnosis not present

## 2020-09-08 DIAGNOSIS — H5212 Myopia, left eye: Secondary | ICD-10-CM | POA: Diagnosis not present

## 2020-09-20 ENCOUNTER — Other Ambulatory Visit: Payer: Self-pay | Admitting: Neurology

## 2020-09-20 DIAGNOSIS — G8222 Paraplegia, incomplete: Secondary | ICD-10-CM

## 2020-09-20 DIAGNOSIS — G0490 Encephalitis and encephalomyelitis, unspecified: Secondary | ICD-10-CM

## 2020-09-27 DIAGNOSIS — H903 Sensorineural hearing loss, bilateral: Secondary | ICD-10-CM | POA: Diagnosis not present

## 2020-10-05 DIAGNOSIS — M816 Localized osteoporosis [Lequesne]: Secondary | ICD-10-CM | POA: Diagnosis not present

## 2020-10-05 DIAGNOSIS — N958 Other specified menopausal and perimenopausal disorders: Secondary | ICD-10-CM | POA: Diagnosis not present

## 2020-10-09 ENCOUNTER — Telehealth: Payer: Self-pay | Admitting: Neurology

## 2020-10-09 ENCOUNTER — Other Ambulatory Visit: Payer: Self-pay | Admitting: Neurology

## 2020-10-09 MED ORDER — PREDNISONE 10 MG PO TABS: ORAL_TABLET | ORAL | 0 refills | Status: DC

## 2020-10-09 NOTE — Telephone Encounter (Signed)
I spoke with her husband.   Katelyn Lamb is having more leg weakness -- exactly like last August.   At thea time a steroid pack helped her quickly.   No signs or symptoms of UTI.     I discussed I can send in another steroid dose pack (same as last August).  If she continues to progress, recommend he take her to the ED to r/o other process and obtain imaging if necessary.

## 2020-10-10 DIAGNOSIS — R8290 Unspecified abnormal findings in urine: Secondary | ICD-10-CM | POA: Diagnosis not present

## 2020-10-10 DIAGNOSIS — Z466 Encounter for fitting and adjustment of urinary device: Secondary | ICD-10-CM | POA: Diagnosis not present

## 2020-10-10 DIAGNOSIS — R197 Diarrhea, unspecified: Secondary | ICD-10-CM | POA: Diagnosis not present

## 2020-10-13 ENCOUNTER — Telehealth: Payer: Self-pay | Admitting: Neurology

## 2020-10-13 NOTE — Telephone Encounter (Signed)
Returned the call to patient's husband, patient was on speaker phone.  Discussed patient's difficulty standing and using walker d/t weakness.    Stated that during one of the office visits with Dr. Jannifer Franklin, it was mentioned that MS patients are sometimes treated with low dose daily steroid. Would like Katelyn Lamb to be considered for that treatment.  Aware Dr. Jannifer Franklin is out til Monday and prefer to have Dr. Jannifer Franklin review the information and decide for her.

## 2020-10-13 NOTE — Telephone Encounter (Signed)
predniSONE (DELTASONE) 10 MG tablet husband(on DPR) has concerns he'd like a call to discuss about this medication .

## 2020-10-14 MED ORDER — PREDNISONE 5 MG PO TABS: 5.0000 mg | ORAL_TABLET | Freq: Every day | ORAL | 1 refills | Status: DC

## 2020-10-14 NOTE — Telephone Encounter (Signed)
I called and talked with the husband.  The was at a point where she could not get out of bed on her own.  When she gets prednisone her ability to ambulate markedly improves and her overall functional level significantly improves.  It appears that when she is not on prednisone, she has a gradual backslide and loses functional abilities.  We discussed the potential long-term side effects of prednisone, but it may be better to have her on a low-dose of prednisone chronically to allow a better functional level.  We will start 5 mg daily of the prednisone when she ends the prednisone taper.  I will send in a prescription.

## 2020-10-14 NOTE — Addendum Note (Signed)
Addended by: [name] on: 10/14/2020 08:45 AM   Modules accepted: Orders

## 2020-10-21 ENCOUNTER — Telehealth: Payer: Self-pay | Admitting: Internal Medicine

## 2020-10-21 MED ORDER — DIPHENOXYLATE-ATROPINE 2.5-0.025 MG PO TABS: 1.0000 | ORAL_TABLET | Freq: Three times a day (TID) | ORAL | 0 refills | Status: DC | PRN

## 2020-10-21 NOTE — Telephone Encounter (Signed)
Pt of Dr. Collene Mares She is treated for chronic diarrhea.  She uses Lomotil 1 to 2 tablets 3 times a day on an as-needed basis. Diarrhea has been intermittent but present over the last 3 to 4 days.  Nothing out of the ordinary for her. No abdominal pain, no blood in stool or melena.  No fevers. Her husband had requested a refill of Lomotil but the pharmacy never sent this to Dr. Lorie Apley office.  He only has 4 tablets left for the weekend.  He is requesting a refill until Dr. Lorie Apley office is available next week.  She normally takes up to 6 tablets daily. I will send electronic prescription for Lomotil 1 to 2 tablets 3 times daily as needed for 5-day supply until Dr. Collene Mares can make decision about longer-term refill. #30 tablets  I will forward to Dr. Collene Mares

## 2020-10-27 ENCOUNTER — Other Ambulatory Visit: Payer: Self-pay | Admitting: Internal Medicine

## 2020-11-02 DIAGNOSIS — K529 Noninfective gastroenteritis and colitis, unspecified: Secondary | ICD-10-CM | POA: Diagnosis not present

## 2020-11-02 DIAGNOSIS — G8222 Paraplegia, incomplete: Secondary | ICD-10-CM | POA: Diagnosis not present

## 2020-11-02 DIAGNOSIS — F411 Generalized anxiety disorder: Secondary | ICD-10-CM | POA: Diagnosis not present

## 2020-11-03 DIAGNOSIS — R197 Diarrhea, unspecified: Secondary | ICD-10-CM | POA: Diagnosis not present

## 2020-11-10 DIAGNOSIS — Z466 Encounter for fitting and adjustment of urinary device: Secondary | ICD-10-CM | POA: Diagnosis not present

## 2020-11-11 DIAGNOSIS — M6281 Muscle weakness (generalized): Secondary | ICD-10-CM | POA: Diagnosis not present

## 2020-11-15 ENCOUNTER — Other Ambulatory Visit: Payer: Self-pay | Admitting: Neurology

## 2020-11-21 DIAGNOSIS — K529 Noninfective gastroenteritis and colitis, unspecified: Secondary | ICD-10-CM | POA: Diagnosis not present

## 2020-11-21 DIAGNOSIS — Z86010 Personal history of colonic polyps: Secondary | ICD-10-CM | POA: Diagnosis not present

## 2020-12-01 DIAGNOSIS — R339 Retention of urine, unspecified: Secondary | ICD-10-CM | POA: Diagnosis not present

## 2020-12-01 DIAGNOSIS — Z9359 Other cystostomy status: Secondary | ICD-10-CM | POA: Diagnosis not present

## 2020-12-01 DIAGNOSIS — N319 Neuromuscular dysfunction of bladder, unspecified: Secondary | ICD-10-CM | POA: Diagnosis not present

## 2020-12-01 DIAGNOSIS — N3289 Other specified disorders of bladder: Secondary | ICD-10-CM | POA: Diagnosis not present

## 2020-12-12 DIAGNOSIS — Z466 Encounter for fitting and adjustment of urinary device: Secondary | ICD-10-CM | POA: Diagnosis not present

## 2020-12-14 ENCOUNTER — Other Ambulatory Visit: Payer: Self-pay

## 2020-12-14 ENCOUNTER — Ambulatory Visit
Admission: RE | Admit: 2020-12-14 | Discharge: 2020-12-14 | Disposition: A | Discharge status: Home / Self Care | Payer: PPO | Source: Ambulatory Visit | Attending: Gastroenterology | Admitting: Gastroenterology

## 2020-12-14 ENCOUNTER — Other Ambulatory Visit: Payer: Self-pay | Admitting: Gastroenterology

## 2020-12-14 DIAGNOSIS — I878 Other specified disorders of veins: Secondary | ICD-10-CM | POA: Diagnosis not present

## 2020-12-14 DIAGNOSIS — R197 Diarrhea, unspecified: Secondary | ICD-10-CM | POA: Diagnosis not present

## 2021-01-06 ENCOUNTER — Other Ambulatory Visit: Payer: Self-pay | Admitting: Neurology

## 2021-01-06 ENCOUNTER — Telehealth: Payer: Self-pay | Admitting: Neurology

## 2021-01-06 NOTE — Telephone Encounter (Signed)
Pt's husband Annie Main [place] request refill predniSONE (DELTASONE) 5 MG tablet at [id number]

## 2021-01-09 NOTE — Telephone Encounter (Signed)
Called and spoke to patient's husband.  He realized he had a refill left and has picked it up already.  Patient denied further questions, verbalized understanding and expressed appreciation for the phone call.

## 2021-01-16 DIAGNOSIS — R109 Unspecified abdominal pain: Secondary | ICD-10-CM | POA: Diagnosis not present

## 2021-01-16 DIAGNOSIS — R194 Change in bowel habit: Secondary | ICD-10-CM | POA: Diagnosis not present

## 2021-01-17 ENCOUNTER — Other Ambulatory Visit: Payer: Self-pay | Admitting: Gastroenterology

## 2021-01-17 DIAGNOSIS — R194 Change in bowel habit: Secondary | ICD-10-CM

## 2021-01-19 DIAGNOSIS — Z466 Encounter for fitting and adjustment of urinary device: Secondary | ICD-10-CM | POA: Diagnosis not present

## 2021-01-25 ENCOUNTER — Other Ambulatory Visit: Payer: Self-pay | Admitting: Neurology

## 2021-01-25 DIAGNOSIS — G9340 Encephalopathy, unspecified: Secondary | ICD-10-CM

## 2021-01-25 DIAGNOSIS — G0490 Encephalitis and encephalomyelitis, unspecified: Secondary | ICD-10-CM

## 2021-01-26 DIAGNOSIS — H353132 Nonexudative age-related macular degeneration, bilateral, intermediate dry stage: Secondary | ICD-10-CM | POA: Diagnosis not present

## 2021-01-26 DIAGNOSIS — H26492 Other secondary cataract, left eye: Secondary | ICD-10-CM | POA: Diagnosis not present

## 2021-01-26 DIAGNOSIS — H18413 Arcus senilis, bilateral: Secondary | ICD-10-CM | POA: Diagnosis not present

## 2021-01-26 DIAGNOSIS — Z961 Presence of intraocular lens: Secondary | ICD-10-CM | POA: Diagnosis not present

## 2021-01-31 DIAGNOSIS — N319 Neuromuscular dysfunction of bladder, unspecified: Secondary | ICD-10-CM | POA: Diagnosis not present

## 2021-01-31 DIAGNOSIS — N3289 Other specified disorders of bladder: Secondary | ICD-10-CM | POA: Diagnosis not present

## 2021-01-31 DIAGNOSIS — Z9359 Other cystostomy status: Secondary | ICD-10-CM | POA: Diagnosis not present

## 2021-01-31 DIAGNOSIS — R339 Retention of urine, unspecified: Secondary | ICD-10-CM | POA: Diagnosis not present

## 2021-02-02 DIAGNOSIS — Z79899 Other long term (current) drug therapy: Secondary | ICD-10-CM | POA: Diagnosis not present

## 2021-02-02 DIAGNOSIS — F5101 Primary insomnia: Secondary | ICD-10-CM | POA: Diagnosis not present

## 2021-02-02 DIAGNOSIS — R2689 Other abnormalities of gait and mobility: Secondary | ICD-10-CM | POA: Diagnosis not present

## 2021-02-02 DIAGNOSIS — G8222 Paraplegia, incomplete: Secondary | ICD-10-CM | POA: Diagnosis not present

## 2021-02-02 DIAGNOSIS — Z13220 Encounter for screening for lipoid disorders: Secondary | ICD-10-CM | POA: Diagnosis not present

## 2021-02-02 DIAGNOSIS — I82502 Chronic embolism and thrombosis of unspecified deep veins of left lower extremity: Secondary | ICD-10-CM | POA: Diagnosis not present

## 2021-02-02 DIAGNOSIS — F331 Major depressive disorder, recurrent, moderate: Secondary | ICD-10-CM | POA: Diagnosis not present

## 2021-02-09 ENCOUNTER — Encounter: Payer: Self-pay | Admitting: Neurology

## 2021-02-09 ENCOUNTER — Ambulatory Visit: Payer: PPO | Admitting: Neurology

## 2021-02-09 VITALS — BP 115/76 | HR 84 | Ht 65.0 in | Wt 150.0 lb

## 2021-02-09 DIAGNOSIS — R269 Unspecified abnormalities of gait and mobility: Secondary | ICD-10-CM

## 2021-02-09 DIAGNOSIS — N319 Neuromuscular dysfunction of bladder, unspecified: Secondary | ICD-10-CM

## 2021-02-09 DIAGNOSIS — M792 Neuralgia and neuritis, unspecified: Secondary | ICD-10-CM | POA: Diagnosis not present

## 2021-02-09 DIAGNOSIS — M545 Low back pain, unspecified: Secondary | ICD-10-CM | POA: Diagnosis not present

## 2021-02-09 DIAGNOSIS — G0490 Encephalitis and encephalomyelitis, unspecified: Secondary | ICD-10-CM | POA: Diagnosis not present

## 2021-02-09 DIAGNOSIS — G9340 Encephalopathy, unspecified: Secondary | ICD-10-CM

## 2021-02-09 DIAGNOSIS — R413 Other amnesia: Secondary | ICD-10-CM

## 2021-02-09 DIAGNOSIS — G894 Chronic pain syndrome: Secondary | ICD-10-CM

## 2021-02-09 MED ORDER — PREGABALIN 50 MG PO CAPS: 100.0000 mg | ORAL_CAPSULE | Freq: Every day | ORAL | 1 refills | Status: DC

## 2021-02-09 MED ORDER — LEVETIRACETAM 250 MG PO TABS: 250.0000 mg | ORAL_TABLET | Freq: Two times a day (BID) | ORAL | 3 refills | Status: DC

## 2021-02-09 MED ORDER — QUETIAPINE FUMARATE 25 MG PO TABS
100.0000 mg | ORAL_TABLET | Freq: Every evening | ORAL | Status: DC | PRN
Start: 2021-02-09 — End: 2021-03-17

## 2021-02-09 NOTE — Progress Notes (Signed)
Reason for visit: History of encephalomyelitis, gait disorder, chronic neuropathic pain  Katelyn Lamb is an 75 y.o. female  History of present illness:  Katelyn Lamb is a 75 year old right-handed white female with a history of a bilateral encephalomyelitis that has left her with a chronic gait disorder, and a neurogenic bladder.  She has a suprapubic catheter in place.  She has a chronic balance issue, she uses a walker for ambulation but she still will fall on occasion.  She often falls backwards on her rear end.  She is having some low back pain following a recent fall.  The patient takes Lyrica 100 mg in the evening, she has gait instability if she takes higher doses.  She is on a 5 mg prednisone dose daily which seems to help her function better.  She has ongoing discomfort that is neuropathic in nature involving the legs and hands.  She returns to this office for further evaluation.  Past Medical History:  Diagnosis Date   Anxiety    Back pain    Chronic kidney disease    Gait abnormality 11/14/2016   Memory difficulty 03/02/2019   Myelitis due to herpes simplex [facility])     Past Surgical History:  Procedure Laterality Date   ABDOMINAL HYSTERECTOMY     BLADDER REPAIR     CESAREAN SECTION     IR KYPHO LUMBAR INC FX REDUCE BONE BX UNI/BIL CANNULATION INC/IMAGING  04/11/2018   TUBAL LIGATION      Family History  Problem Relation Age of Onset   Hypertension Mother     Social history:  reports that she has quit smoking. She has never used smokeless tobacco. She reports that she does not drink alcohol and does not use drugs.    Allergies  Allergen Reactions   Demerol [Meperidine] Other (See Comments)    Hallucinations   Percocet [Oxycodone-Acetaminophen] Itching   Amoxicillin-Pot Clavulanate Diarrhea    Severe pain, headache, intestinal infection   Penicillins Itching and Rash    Has patient had a PCN reaction causing immediate rash, facial/tongue/throat swelling, SOB or  lightheadedness with hypotension:  NO Has patient had a PCN reaction causing severe rash involving mucus membranes or skin necrosis: No Has patient had a PCN reaction that required hospitalization: No Has patient had a PCN reaction occurring within the last 10 years: Yes If all of the above answers are "NO", then may proceed with Cephalosporin use.    Medications:  Prior to Admission medications   Medication Sig Start Date End Date Taking? Authorizing Provider  acyclovir (ZOVIRAX) 400 MG tablet TAKE 1 TABLET BY MOUTH TWICE A DAY 09/21/20  Yes [name], MD  colestipol (COLESTID) 1 g tablet Take 2 g by mouth 2 (two) times daily.   Yes [provider]  diazepam (VALIUM) 5 MG tablet Take 1 tablet (5 mg total) by mouth in the morning and at bedtime. 08/10/20  Yes [name], MD  diclofenac Sodium (VOLTAREN) 1 % GEL Apply 1 application topically daily as needed (pain).   Yes [provider]  diphenoxylate-atropine (LOMOTIL) 2.5-0.025 MG tablet Take 1-2 tablets by mouth 3 (three) times daily as needed for diarrhea or loose stools. 10/21/20  Yes Pyrtle, Lajuan Lines, MD  DULoxetine (CYMBALTA) 30 MG capsule TAKE 1 CAPSULE BY MOUTH EVERY DAY IN THE MORNING 11/16/20  Yes [name], MD  DULoxetine (CYMBALTA) 60 MG capsule TAKE 1 CAPSULE BY MOUTH AT BEDTIME 09/05/20  Yes [name],  MD  HYDROcodone-acetaminophen (NORCO) 10-325 MG tablet Take 1 tablet by mouth every 6 (six) hours as needed for pain. 03/07/18  Yes [provider]  mirabegron ER (MYRBETRIQ) 50 MG TB24 tablet Take 50 mg by mouth daily.   Yes [provider]  Multiple Vitamins-Minerals (HAIR/SKIN/NAILS/BIOTIN) TABS Take 1 tablet by mouth daily.   Yes [provider]  omega-3 acid ethyl esters (LOVAZA) 1 g capsule Take 1 g by mouth daily.   Yes [provider]  pantoprazole (PROTONIX) 40 MG tablet Take 1 tablet (40 mg total) by mouth daily. Patient taking differently: Take 40  mg by mouth at bedtime. 10/26/16  Yes Angiulli, Lavon Paganini, PA-C  polyethylene glycol (MIRALAX / GLYCOLAX) packet Take 17 g by mouth daily. Patient taking differently: Take 17 g by mouth daily as needed (constipation). 10/26/16  Yes Angiulli, Lavon Paganini, PA-C  predniSONE (DELTASONE) 5 MG tablet Take 1 tablet (5 mg total) by mouth daily with breakfast. 10/14/20  Yes [name], MD  pregabalin (LYRICA) 50 MG capsule TAKE 1 CAPSULE BY MOUTH IN THE MORNING AND 2 CAPSULES IN THE EVENING. 01/26/21  Yes [name], MD  QUEtiapine (SEROQUEL) 25 MG tablet Take 1 tablet (25 mg total) by mouth at bedtime. Patient taking differently: Take 100 mg by mouth at bedtime as needed (sleep aid). 11/16/19  Yes [name], MD  rivaroxaban (XARELTO) 20 MG TABS tablet Take 1 tablet (20 mg total) by mouth daily with supper. 03/06/20  Yes Cherene Altes, MD  diphenoxylate-atropine (LOMOTIL) 2.5-0.025 MG tablet Take 1 tablet by mouth 4 (four) times daily as needed for diarrhea or loose stools.    [provider]  nitrofurantoin, macrocrystal-monohydrate, (MACROBID) 100 MG capsule Take 100 mg by mouth daily.    [provider]    ROS:  Out of a complete 14 system review of symptoms, the patient complains only of the following symptoms, and all other reviewed systems are negative.  Walking difficulty Chronic pain  Blood pressure 115/76, pulse 84, height 5\' 5"  (1.651 m), weight 150 lb (68 kg).  Physical Exam  General: The patient is alert and cooperative at the time of the examination.  Skin: No significant peripheral edema is noted.   Neurologic Exam  Mental status: The patient is alert and oriented x 3 at the time of the examination. The patient has apparent normal recent and remote memory, with an apparently normal attention span and concentration ability.   Cranial nerves: Facial symmetry is present. Speech is normal, no aphasia or dysarthria is noted. Extraocular movements are  full. Visual fields are full.  Motor: The patient has good strength in all 4 extremities.  Sensory examination: Soft touch sensation is symmetric on the face, arms, and legs.  Coordination: The patient has good finger-nose-finger and heel-to-shin bilaterally.  Gait and station: The patient has a wide-based gait, she can walk easily with the examiner.  Tandem gait was not attempted.  Reflexes: Deep tendon reflexes are symmetric.   Assessment/Plan:  1.  History of encephalomyelitis  2.  Chronic neuropathic pain  3.  Gait disorder  The patient reports some low back pain following a fall recently.  I will check an x-ray of the lumbar spine.  She will be given a trial on low-dose Keppra taking 250 mg twice daily to see if this helps the neuropathic pain.  The patient will remain on the Lyrica for now.  She is also on Cymbalta.  She will follow-up here  in 6 months, she can follow-up with Dr. Felecia Shelling in the future.  Jill Alexanders MD 02/09/2021 3:29 PM  [facility] [address] [place], [place] [address]  Phone [phone] Fax [phone]

## 2021-02-10 ENCOUNTER — Other Ambulatory Visit: Payer: Self-pay | Admitting: Neurology

## 2021-02-14 ENCOUNTER — Telehealth: Payer: Self-pay | Admitting: Neurology

## 2021-02-14 DIAGNOSIS — Z13220 Encounter for screening for lipoid disorders: Secondary | ICD-10-CM | POA: Diagnosis not present

## 2021-02-14 DIAGNOSIS — Z79899 Other long term (current) drug therapy: Secondary | ICD-10-CM | POA: Diagnosis not present

## 2021-02-14 DIAGNOSIS — D509 Iron deficiency anemia, unspecified: Secondary | ICD-10-CM | POA: Diagnosis not present

## 2021-02-14 DIAGNOSIS — E7800 Pure hypercholesterolemia, unspecified: Secondary | ICD-10-CM | POA: Diagnosis not present

## 2021-02-14 NOTE — Telephone Encounter (Signed)
Pt's husband Annie Main [employer]ox called, she took her last diazepam (VALIUM) 5 MG tablet this morning. Have already requested refill on 02/09/21. Would like a call from the nurse.

## 2021-02-14 NOTE — Telephone Encounter (Signed)
The diazepam prescription was sent in.

## 2021-02-16 DIAGNOSIS — F411 Generalized anxiety disorder: Secondary | ICD-10-CM | POA: Diagnosis not present

## 2021-02-16 DIAGNOSIS — M792 Neuralgia and neuritis, unspecified: Secondary | ICD-10-CM | POA: Diagnosis not present

## 2021-02-16 DIAGNOSIS — F5101 Primary insomnia: Secondary | ICD-10-CM | POA: Diagnosis not present

## 2021-02-16 DIAGNOSIS — M47812 Spondylosis without myelopathy or radiculopathy, cervical region: Secondary | ICD-10-CM | POA: Diagnosis not present

## 2021-02-16 DIAGNOSIS — F331 Major depressive disorder, recurrent, moderate: Secondary | ICD-10-CM | POA: Diagnosis not present

## 2021-02-16 DIAGNOSIS — Z86711 Personal history of pulmonary embolism: Secondary | ICD-10-CM | POA: Diagnosis not present

## 2021-02-16 DIAGNOSIS — E7800 Pure hypercholesterolemia, unspecified: Secondary | ICD-10-CM | POA: Diagnosis not present

## 2021-02-16 DIAGNOSIS — I82502 Chronic embolism and thrombosis of unspecified deep veins of left lower extremity: Secondary | ICD-10-CM | POA: Diagnosis not present

## 2021-02-16 DIAGNOSIS — G2581 Restless legs syndrome: Secondary | ICD-10-CM | POA: Diagnosis not present

## 2021-02-16 DIAGNOSIS — Z9181 History of falling: Secondary | ICD-10-CM | POA: Diagnosis not present

## 2021-02-16 DIAGNOSIS — Z7952 Long term (current) use of systemic steroids: Secondary | ICD-10-CM | POA: Diagnosis not present

## 2021-02-16 DIAGNOSIS — M4802 Spinal stenosis, cervical region: Secondary | ICD-10-CM | POA: Diagnosis not present

## 2021-02-16 DIAGNOSIS — Z87891 Personal history of nicotine dependence: Secondary | ICD-10-CM | POA: Diagnosis not present

## 2021-02-16 DIAGNOSIS — Z7901 Long term (current) use of anticoagulants: Secondary | ICD-10-CM | POA: Diagnosis not present

## 2021-02-16 DIAGNOSIS — K219 Gastro-esophageal reflux disease without esophagitis: Secondary | ICD-10-CM | POA: Diagnosis not present

## 2021-02-16 DIAGNOSIS — G8929 Other chronic pain: Secondary | ICD-10-CM | POA: Diagnosis not present

## 2021-02-16 DIAGNOSIS — G8222 Paraplegia, incomplete: Secondary | ICD-10-CM | POA: Diagnosis not present

## 2021-02-16 DIAGNOSIS — M810 Age-related osteoporosis without current pathological fracture: Secondary | ICD-10-CM | POA: Diagnosis not present

## 2021-02-16 DIAGNOSIS — M170 Bilateral primary osteoarthritis of knee: Secondary | ICD-10-CM | POA: Diagnosis not present

## 2021-02-26 ENCOUNTER — Other Ambulatory Visit: Payer: Self-pay | Admitting: Neurology

## 2021-02-26 DIAGNOSIS — G9340 Encephalopathy, unspecified: Secondary | ICD-10-CM

## 2021-02-26 DIAGNOSIS — R413 Other amnesia: Secondary | ICD-10-CM

## 2021-02-26 DIAGNOSIS — M792 Neuralgia and neuritis, unspecified: Secondary | ICD-10-CM

## 2021-02-26 DIAGNOSIS — R269 Unspecified abnormalities of gait and mobility: Secondary | ICD-10-CM

## 2021-02-26 DIAGNOSIS — G0490 Encephalitis and encephalomyelitis, unspecified: Secondary | ICD-10-CM

## 2021-03-01 ENCOUNTER — Inpatient Hospital Stay: Admission: RE | Admit: 2021-03-01 | Payer: PPO | Source: Ambulatory Visit

## 2021-03-02 DIAGNOSIS — N319 Neuromuscular dysfunction of bladder, unspecified: Secondary | ICD-10-CM | POA: Diagnosis not present

## 2021-03-02 DIAGNOSIS — R800 Isolated proteinuria: Secondary | ICD-10-CM | POA: Diagnosis not present

## 2021-03-02 DIAGNOSIS — Z9359 Other cystostomy status: Secondary | ICD-10-CM | POA: Diagnosis not present

## 2021-03-02 DIAGNOSIS — Z4803 Encounter for change or removal of drains: Secondary | ICD-10-CM | POA: Diagnosis not present

## 2021-03-02 DIAGNOSIS — R8289 Other abnormal findings on cytological and histological examination of urine: Secondary | ICD-10-CM | POA: Diagnosis not present

## 2021-03-03 DIAGNOSIS — M47812 Spondylosis without myelopathy or radiculopathy, cervical region: Secondary | ICD-10-CM | POA: Diagnosis not present

## 2021-03-03 DIAGNOSIS — G8222 Paraplegia, incomplete: Secondary | ICD-10-CM | POA: Diagnosis not present

## 2021-03-03 DIAGNOSIS — Z7901 Long term (current) use of anticoagulants: Secondary | ICD-10-CM | POA: Diagnosis not present

## 2021-03-03 DIAGNOSIS — M4802 Spinal stenosis, cervical region: Secondary | ICD-10-CM | POA: Diagnosis not present

## 2021-03-03 DIAGNOSIS — F5101 Primary insomnia: Secondary | ICD-10-CM | POA: Diagnosis not present

## 2021-03-03 DIAGNOSIS — E7800 Pure hypercholesterolemia, unspecified: Secondary | ICD-10-CM | POA: Diagnosis not present

## 2021-03-03 DIAGNOSIS — Z86711 Personal history of pulmonary embolism: Secondary | ICD-10-CM | POA: Diagnosis not present

## 2021-03-03 DIAGNOSIS — M170 Bilateral primary osteoarthritis of knee: Secondary | ICD-10-CM | POA: Diagnosis not present

## 2021-03-03 DIAGNOSIS — I82502 Chronic embolism and thrombosis of unspecified deep veins of left lower extremity: Secondary | ICD-10-CM | POA: Diagnosis not present

## 2021-03-03 DIAGNOSIS — G2581 Restless legs syndrome: Secondary | ICD-10-CM | POA: Diagnosis not present

## 2021-03-03 DIAGNOSIS — G8929 Other chronic pain: Secondary | ICD-10-CM | POA: Diagnosis not present

## 2021-03-03 DIAGNOSIS — Z9181 History of falling: Secondary | ICD-10-CM | POA: Diagnosis not present

## 2021-03-03 DIAGNOSIS — K219 Gastro-esophageal reflux disease without esophagitis: Secondary | ICD-10-CM | POA: Diagnosis not present

## 2021-03-03 DIAGNOSIS — F411 Generalized anxiety disorder: Secondary | ICD-10-CM | POA: Diagnosis not present

## 2021-03-03 DIAGNOSIS — Z87891 Personal history of nicotine dependence: Secondary | ICD-10-CM | POA: Diagnosis not present

## 2021-03-03 DIAGNOSIS — M792 Neuralgia and neuritis, unspecified: Secondary | ICD-10-CM | POA: Diagnosis not present

## 2021-03-03 DIAGNOSIS — M810 Age-related osteoporosis without current pathological fracture: Secondary | ICD-10-CM | POA: Diagnosis not present

## 2021-03-03 DIAGNOSIS — Z7952 Long term (current) use of systemic steroids: Secondary | ICD-10-CM | POA: Diagnosis not present

## 2021-03-03 DIAGNOSIS — F331 Major depressive disorder, recurrent, moderate: Secondary | ICD-10-CM | POA: Diagnosis not present

## 2021-03-07 DIAGNOSIS — E611 Iron deficiency: Secondary | ICD-10-CM | POA: Diagnosis not present

## 2021-03-08 DIAGNOSIS — Z7952 Long term (current) use of systemic steroids: Secondary | ICD-10-CM | POA: Diagnosis not present

## 2021-03-08 DIAGNOSIS — I82502 Chronic embolism and thrombosis of unspecified deep veins of left lower extremity: Secondary | ICD-10-CM | POA: Diagnosis not present

## 2021-03-08 DIAGNOSIS — M47812 Spondylosis without myelopathy or radiculopathy, cervical region: Secondary | ICD-10-CM | POA: Diagnosis not present

## 2021-03-08 DIAGNOSIS — G2581 Restless legs syndrome: Secondary | ICD-10-CM | POA: Diagnosis not present

## 2021-03-08 DIAGNOSIS — Z7901 Long term (current) use of anticoagulants: Secondary | ICD-10-CM | POA: Diagnosis not present

## 2021-03-08 DIAGNOSIS — M170 Bilateral primary osteoarthritis of knee: Secondary | ICD-10-CM | POA: Diagnosis not present

## 2021-03-08 DIAGNOSIS — M4802 Spinal stenosis, cervical region: Secondary | ICD-10-CM | POA: Diagnosis not present

## 2021-03-08 DIAGNOSIS — M810 Age-related osteoporosis without current pathological fracture: Secondary | ICD-10-CM | POA: Diagnosis not present

## 2021-03-08 DIAGNOSIS — E7800 Pure hypercholesterolemia, unspecified: Secondary | ICD-10-CM | POA: Diagnosis not present

## 2021-03-08 DIAGNOSIS — F5101 Primary insomnia: Secondary | ICD-10-CM | POA: Diagnosis not present

## 2021-03-08 DIAGNOSIS — G8929 Other chronic pain: Secondary | ICD-10-CM | POA: Diagnosis not present

## 2021-03-08 DIAGNOSIS — F411 Generalized anxiety disorder: Secondary | ICD-10-CM | POA: Diagnosis not present

## 2021-03-08 DIAGNOSIS — M792 Neuralgia and neuritis, unspecified: Secondary | ICD-10-CM | POA: Diagnosis not present

## 2021-03-08 DIAGNOSIS — F331 Major depressive disorder, recurrent, moderate: Secondary | ICD-10-CM | POA: Diagnosis not present

## 2021-03-08 DIAGNOSIS — Z9181 History of falling: Secondary | ICD-10-CM | POA: Diagnosis not present

## 2021-03-08 DIAGNOSIS — Z87891 Personal history of nicotine dependence: Secondary | ICD-10-CM | POA: Diagnosis not present

## 2021-03-08 DIAGNOSIS — Z86711 Personal history of pulmonary embolism: Secondary | ICD-10-CM | POA: Diagnosis not present

## 2021-03-08 DIAGNOSIS — G8222 Paraplegia, incomplete: Secondary | ICD-10-CM | POA: Diagnosis not present

## 2021-03-08 DIAGNOSIS — K219 Gastro-esophageal reflux disease without esophagitis: Secondary | ICD-10-CM | POA: Diagnosis not present

## 2021-03-10 DIAGNOSIS — W19XXXA Unspecified fall, initial encounter: Secondary | ICD-10-CM | POA: Diagnosis not present

## 2021-03-10 DIAGNOSIS — M533 Sacrococcygeal disorders, not elsewhere classified: Secondary | ICD-10-CM | POA: Diagnosis not present

## 2021-03-14 ENCOUNTER — Encounter (HOSPITAL_COMMUNITY): Payer: Self-pay

## 2021-03-14 ENCOUNTER — Other Ambulatory Visit: Payer: Self-pay

## 2021-03-14 ENCOUNTER — Inpatient Hospital Stay (HOSPITAL_COMMUNITY)
Admission: EM | Admit: 2021-03-14 | Discharge: 2021-03-17 | DRG: 698 | Disposition: A | Discharge status: Home Health | Payer: PPO | Attending: Family Medicine | Admitting: Family Medicine

## 2021-03-14 DIAGNOSIS — Z79899 Other long term (current) drug therapy: Secondary | ICD-10-CM | POA: Diagnosis not present

## 2021-03-14 DIAGNOSIS — Z90710 Acquired absence of both cervix and uterus: Secondary | ICD-10-CM | POA: Diagnosis not present

## 2021-03-14 DIAGNOSIS — R079 Chest pain, unspecified: Secondary | ICD-10-CM | POA: Diagnosis not present

## 2021-03-14 DIAGNOSIS — N3001 Acute cystitis with hematuria: Secondary | ICD-10-CM | POA: Diagnosis not present

## 2021-03-14 DIAGNOSIS — S22009A Unspecified fracture of unspecified thoracic vertebra, initial encounter for closed fracture: Secondary | ICD-10-CM | POA: Diagnosis not present

## 2021-03-14 DIAGNOSIS — G0490 Encephalitis and encephalomyelitis, unspecified: Secondary | ICD-10-CM | POA: Diagnosis not present

## 2021-03-14 DIAGNOSIS — K529 Noninfective gastroenteritis and colitis, unspecified: Secondary | ICD-10-CM | POA: Diagnosis present

## 2021-03-14 DIAGNOSIS — T83518A Infection and inflammatory reaction due to other urinary catheter, initial encounter: Secondary | ICD-10-CM | POA: Diagnosis not present

## 2021-03-14 DIAGNOSIS — Z8249 Family history of ischemic heart disease and other diseases of the circulatory system: Secondary | ICD-10-CM

## 2021-03-14 DIAGNOSIS — R339 Retention of urine, unspecified: Secondary | ICD-10-CM | POA: Diagnosis present

## 2021-03-14 DIAGNOSIS — Z7901 Long term (current) use of anticoagulants: Secondary | ICD-10-CM | POA: Diagnosis not present

## 2021-03-14 DIAGNOSIS — Y846 Urinary catheterization as the cause of abnormal reaction of the patient, or of later complication, without mention of misadventure at the time of the procedure: Secondary | ICD-10-CM | POA: Diagnosis present

## 2021-03-14 DIAGNOSIS — Z87440 Personal history of urinary (tract) infections: Secondary | ICD-10-CM | POA: Diagnosis not present

## 2021-03-14 DIAGNOSIS — G894 Chronic pain syndrome: Secondary | ICD-10-CM | POA: Diagnosis not present

## 2021-03-14 DIAGNOSIS — D696 Thrombocytopenia, unspecified: Secondary | ICD-10-CM | POA: Diagnosis not present

## 2021-03-14 DIAGNOSIS — Z8661 Personal history of infections of the central nervous system: Secondary | ICD-10-CM | POA: Diagnosis not present

## 2021-03-14 DIAGNOSIS — M5416 Radiculopathy, lumbar region: Secondary | ICD-10-CM | POA: Diagnosis present

## 2021-03-14 DIAGNOSIS — R531 Weakness: Secondary | ICD-10-CM

## 2021-03-14 DIAGNOSIS — Z880 Allergy status to penicillin: Secondary | ICD-10-CM

## 2021-03-14 DIAGNOSIS — R278 Other lack of coordination: Secondary | ICD-10-CM | POA: Diagnosis present

## 2021-03-14 DIAGNOSIS — W19XXXA Unspecified fall, initial encounter: Secondary | ICD-10-CM | POA: Diagnosis not present

## 2021-03-14 DIAGNOSIS — D1809 Hemangioma of other sites: Secondary | ICD-10-CM | POA: Diagnosis not present

## 2021-03-14 DIAGNOSIS — G319 Degenerative disease of nervous system, unspecified: Secondary | ICD-10-CM | POA: Diagnosis not present

## 2021-03-14 DIAGNOSIS — Z86711 Personal history of pulmonary embolism: Secondary | ICD-10-CM | POA: Diagnosis not present

## 2021-03-14 DIAGNOSIS — K5900 Constipation, unspecified: Secondary | ICD-10-CM | POA: Diagnosis not present

## 2021-03-14 DIAGNOSIS — N319 Neuromuscular dysfunction of bladder, unspecified: Secondary | ICD-10-CM | POA: Diagnosis not present

## 2021-03-14 DIAGNOSIS — I825Z9 Chronic embolism and thrombosis of unspecified deep veins of unspecified distal lower extremity: Secondary | ICD-10-CM | POA: Diagnosis present

## 2021-03-14 DIAGNOSIS — Z87891 Personal history of nicotine dependence: Secondary | ICD-10-CM | POA: Diagnosis not present

## 2021-03-14 DIAGNOSIS — G8222 Paraplegia, incomplete: Secondary | ICD-10-CM | POA: Diagnosis present

## 2021-03-14 DIAGNOSIS — E876 Hypokalemia: Secondary | ICD-10-CM | POA: Diagnosis not present

## 2021-03-14 DIAGNOSIS — Z888 Allergy status to other drugs, medicaments and biological substances status: Secondary | ICD-10-CM

## 2021-03-14 DIAGNOSIS — Z885 Allergy status to narcotic agent status: Secondary | ICD-10-CM

## 2021-03-14 DIAGNOSIS — Z86718 Personal history of other venous thrombosis and embolism: Secondary | ICD-10-CM

## 2021-03-14 DIAGNOSIS — R42 Dizziness and giddiness: Secondary | ICD-10-CM | POA: Diagnosis not present

## 2021-03-14 DIAGNOSIS — R296 Repeated falls: Secondary | ICD-10-CM | POA: Diagnosis not present

## 2021-03-14 DIAGNOSIS — R102 Pelvic and perineal pain: Secondary | ICD-10-CM | POA: Diagnosis not present

## 2021-03-14 DIAGNOSIS — M4802 Spinal stenosis, cervical region: Secondary | ICD-10-CM | POA: Diagnosis not present

## 2021-03-14 DIAGNOSIS — I6782 Cerebral ischemia: Secondary | ICD-10-CM | POA: Diagnosis not present

## 2021-03-14 DIAGNOSIS — D649 Anemia, unspecified: Secondary | ICD-10-CM | POA: Diagnosis not present

## 2021-03-14 DIAGNOSIS — Z7952 Long term (current) use of systemic steroids: Secondary | ICD-10-CM

## 2021-03-14 DIAGNOSIS — G9589 Other specified diseases of spinal cord: Secondary | ICD-10-CM | POA: Diagnosis not present

## 2021-03-14 DIAGNOSIS — R0902 Hypoxemia: Secondary | ICD-10-CM | POA: Diagnosis not present

## 2021-03-14 DIAGNOSIS — M47814 Spondylosis without myelopathy or radiculopathy, thoracic region: Secondary | ICD-10-CM | POA: Diagnosis not present

## 2021-03-14 DIAGNOSIS — M2578 Osteophyte, vertebrae: Secondary | ICD-10-CM | POA: Diagnosis not present

## 2021-03-14 DIAGNOSIS — R269 Unspecified abnormalities of gait and mobility: Secondary | ICD-10-CM

## 2021-03-14 DIAGNOSIS — M4315 Spondylolisthesis, thoracolumbar region: Secondary | ICD-10-CM | POA: Diagnosis not present

## 2021-03-14 DIAGNOSIS — M47812 Spondylosis without myelopathy or radiculopathy, cervical region: Secondary | ICD-10-CM | POA: Diagnosis not present

## 2021-03-14 DIAGNOSIS — G9341 Metabolic encephalopathy: Secondary | ICD-10-CM | POA: Diagnosis present

## 2021-03-14 DIAGNOSIS — G9340 Encephalopathy, unspecified: Secondary | ICD-10-CM | POA: Diagnosis present

## 2021-03-14 DIAGNOSIS — M792 Neuralgia and neuritis, unspecified: Secondary | ICD-10-CM | POA: Diagnosis present

## 2021-03-14 DIAGNOSIS — Z20822 Contact with and (suspected) exposure to covid-19: Secondary | ICD-10-CM | POA: Diagnosis present

## 2021-03-14 HISTORY — DX: Acute embolism and thrombosis of left popliteal vein: I82.432

## 2021-03-14 HISTORY — DX: Neuromuscular dysfunction of bladder, unspecified: N31.9

## 2021-03-14 HISTORY — DX: Encephalitis and encephalomyelitis, unspecified: G04.90

## 2021-03-14 NOTE — ED Provider Notes (Signed)
[facility] DEPT Provider Note   CSN: [id number] Arrival date & time: 03/14/21  2139     History Chief Complaint  Patient presents with   Weakness     Katelyn Lamb is a 75 y.o. female with a history of chronic kidney disease, intractable back pain, thrombocytopenia, encephalomyelitis (HSV), anemia, prior DVT/PE on Xarelto, and chronic pain syndrome who presents to the emergency department with her husband for evaluation of progressively worsening lower extremity weakness over the past 4 to 5 days.  Patient reports that she has been having progressively worsening weakness and paresthesias to her bilateral lower extremities which extends up to her thighs, she states is worse when she attempts to stand her legs will give out and she is unable to walk.  She has had intermittent dizziness described as a room spinning that occurs with certain position changes, it is not constant or present at rest.  She has had 6 associated falls with her dizziness as well as her lower extremity weakness, she has hit her head with some of these falls.  No other alleviating or aggravating factors.  Her husband states that all of this started when she was having excessive diarrhea while trying to clean herself out for a GI procedure.  Husband also mentions that she has been somewhat confused thinking some people in the house when they are not.  She does have chronic problems with intermittent diarrhea.  She has had problems with lower extremity weakness/numbness previously, first episode was when she had her HSV myelitis, and she did have another episode which was treated with outpatient steroids.  She denies recent fever, vomiting, melena, bright red blood per rectum, chest pain, dyspnea, cough, abdominal pain.  She continues to have her chronic lower back pain. HPI     Past Medical History:  Diagnosis Date   Anxiety    Back pain    Chronic kidney disease    Gait abnormality 11/14/2016    Memory difficulty 03/02/2019   Myelitis due to herpes simplex [facility])     Patient Active Problem List   Diagnosis Date Noted   Right pulmonary embolus ([place]) 02/12/2020   Lower extremity weakness 02/02/2020   Macrocytic anemia 02/02/2020   Spinal stenosis 02/02/2020   History of recurrent UTI (urinary tract infection)    Bacterial infection due to Klebsiella pneumoniae    Laceration of left hand 12/09/2019   DNR (do not resuscitate) 12/09/2019   Pelvic fracture ([place]) 12/06/2019   AKI (acute kidney injury) ([place]) 12/06/2019   Hematuria 07/23/2019   Hypokalemia 07/23/2019   Acute on chronic anemia 07/23/2019   Fever 07/23/2019   Leukocytosis 07/23/2019   Sepsis ([place]) 07/23/2019   Generalized anxiety disorder 06/17/2019   Menopausal sweats 06/17/2019   Memory difficulty 03/02/2019   Degenerative spondylolisthesis 09/02/2018   Delirium 03/26/2018   Degeneration of lumbar intervertebral disc 11/08/2017   Lumbar radiculopathy 11/06/2017   Chronic neck pain 09/12/2017   Chronic low back pain 09/06/2017   Chronic pain syndrome 09/06/2017   Dilated pancreatic duct 01/24/2017   DVT, lower extremity, distal, chronic ([place]) 01/22/2017   Elevated serum GGT level 01/22/2017   Medication monitoring encounter 01/08/2017   Gait abnormality 11/14/2016   Hypotension due to drugs    Urinary retention    Anxiety about health    Reactive depression    Ataxia    Abdominal spasms    Constipation due to pain medication    Acute lower UTI  Dysuria    Acute deep vein thrombosis (DVT) of popliteal vein of left lower extremity (HCC)    Incomplete paraplegia (HCC)    Acute blood loss anemia    Neurogenic bladder    Neuropathic pain    Muscle spasm    Gastroesophageal reflux disease    Slow transit constipation    Thrombocytopenia (HCC) 10/03/2016   Abnormal MRI, spinal cord    Encephalomyelitis    Numbness    Intractable back pain 09/20/2016   Numbness of left lower extremity 09/20/2016    Hyponatremia 09/20/2016   Herpes zoster without complication 27/74/1287   Spondylosis of cervical region without myelopathy or radiculopathy 06/16/2015    Past Surgical History:  Procedure Laterality Date   ABDOMINAL HYSTERECTOMY     BLADDER REPAIR     CESAREAN SECTION     IR KYPHO LUMBAR INC FX REDUCE BONE BX UNI/BIL CANNULATION INC/IMAGING  04/11/2018   TUBAL LIGATION       OB History   No obstetric history on file.     Family History  Problem Relation Age of Onset   Hypertension Mother     Social History   Tobacco Use   Smoking status: Former    Pack years: 0.00   Smokeless tobacco: Never   Tobacco comments:    40 years ago   Vaping Use   Vaping Use: Never used  Substance Use Topics   Alcohol use: No   Drug use: No    Home Medications Prior to Admission medications   Medication Sig Start Date End Date Taking? Authorizing Provider  acyclovir (ZOVIRAX) 400 MG tablet TAKE 1 TABLET BY MOUTH TWICE A DAY 09/21/20   [name], MD  colestipol (COLESTID) 1 g tablet Take 2 g by mouth 2 (two) times daily.    [provider]  diazepam (VALIUM) 5 MG tablet TAKE 1 TABLET (5 MG TOTAL) BY MOUTH IN THE MORNING AND AT BEDTIME. 02/14/21   [name], MD  diclofenac Sodium (VOLTAREN) 1 % GEL Apply 1 application topically daily as needed (pain).    [provider]  diphenoxylate-atropine (LOMOTIL) 2.5-0.025 MG tablet Take 1-2 tablets by mouth 3 (three) times daily as needed for diarrhea or loose stools. 10/21/20   Pyrtle, Lajuan Lines, MD  DULoxetine (CYMBALTA) 30 MG capsule TAKE 1 CAPSULE BY MOUTH EVERY DAY IN THE MORNING 11/16/20   [name], MD  DULoxetine (CYMBALTA) 60 MG capsule TAKE 1 CAPSULE BY MOUTH EVERYDAY AT BEDTIME 02/27/21   [name], MD  HYDROcodone-acetaminophen [facility]) 10-325 MG tablet Take 1 tablet by mouth every 6 (six) hours as needed for pain. 03/07/18   [provider]  levETIRAcetam (KEPPRA) 250 MG tablet Take 1  tablet (250 mg total) by mouth 2 (two) times daily. 02/09/21   [name], MD  mirabegron ER (MYRBETRIQ) 50 MG TB24 tablet Take 50 mg by mouth daily.    [provider]  Multiple Vitamins-Minerals (HAIR/SKIN/NAILS/BIOTIN) TABS Take 1 tablet by mouth daily.    [provider]  omega-3 acid ethyl esters (LOVAZA) 1 g capsule Take 1 g by mouth daily.    [provider]  pantoprazole (PROTONIX) 40 MG tablet Take 1 tablet (40 mg total) by mouth daily. Patient taking differently: Take 40 mg by mouth at bedtime. 10/26/16   Angiulli, Lavon Paganini, PA-C  polyethylene glycol (MIRALAX / GLYCOLAX) packet Take 17 g by mouth daily. Patient taking differently: Take 17 g by mouth daily as  needed (constipation). 10/26/16   Angiulli, Lavon Paganini, PA-C  predniSONE (DELTASONE) 5 MG tablet Take 1 tablet (5 mg total) by mouth daily with breakfast. 10/14/20   [name], MD  pregabalin (LYRICA) 50 MG capsule Take 2 capsules (100 mg total) by mouth at bedtime. 02/09/21   [name], MD  QUEtiapine (SEROQUEL) 25 MG tablet Take 4 tablets (100 mg total) by mouth at bedtime as needed (sleep aid). 02/09/21   [name], MD  rivaroxaban (XARELTO) 20 MG TABS tablet Take 1 tablet (20 mg total) by mouth daily with supper. 03/06/20   Cherene Altes, MD    Allergies    Demerol [meperidine], Percocet [oxycodone-acetaminophen], Amoxicillin-pot clavulanate, and Penicillins  Review of Systems   Review of Systems  Constitutional:  Negative for chills and fever.  Eyes:  Negative for visual disturbance.  Respiratory:  Negative for cough and shortness of breath.   Cardiovascular:  Negative for chest pain and leg swelling.  Gastrointestinal:  Positive for diarrhea. Negative for abdominal pain, blood in stool and vomiting.  Musculoskeletal:  Positive for arthralgias.  Neurological:  Positive for dizziness, weakness and numbness. Negative for syncope.  Psychiatric/Behavioral:  Positive for  confusion.   All other systems reviewed and are negative.  Physical Exam Updated Vital Signs BP 122/77 (BP Location: Left Arm)   Pulse 92   Temp 98.2 F (36.8 C) (Oral)   Resp 18   SpO2 94%   Physical Exam Vitals and nursing note reviewed.  Constitutional:      General: She is not in acute distress. HENT:     Head: Normocephalic and atraumatic.     Comments: No raccoon eyes or battle sign. Eyes:     Extraocular Movements: Extraocular movements intact.     Pupils: Pupils are equal, round, and reactive to light.     Comments: No vertical or rotational nystagmus.  Cardiovascular:     Rate and Rhythm: Normal rate and regular rhythm.     Comments: 2+ symmetric DP pulses bilaterally. Pulmonary:     Effort: Pulmonary effort is normal.     Breath sounds: Normal breath sounds.  Abdominal:     General: There is no distension.     Palpations: Abdomen is soft.     Tenderness: There is no abdominal tenderness. There is no guarding or rebound.     Comments: Old appearing bruise to right lower quadrant of the abdomen. Suprapubic catheter in place without surrounding erythema or purulence.  Musculoskeletal:     Cervical back: Neck supple.     Comments: Upper extremities: Actively ranging all major joints.  No focal bony tenderness. Back: No point/focal vertebral tenderness or palpable step-off Lower extremities: Able to actively move at all major joints.  Mild tenderness to the pelvis.   Skin:    General: Skin is warm and dry.     Capillary Refill: Capillary refill takes less than 2 seconds.  Neurological:     Mental Status: She is alert.     Comments: Alert.  Clear speech.  No facial droop.  CN III through XII grossly intact.  Sensation grossly tact bilateral upper and lower extremities with light touch, patient does report some decreased sensation to the bilateral lower extremities.  Negative pronator drift, some difficulty bilaterally with finger-to-nose.  Patient is able to lift  bilateral lower extremities off of the stretcher and hold them off the bed for 5 seconds against gravity.  She has 5 out of 5 strength with  plantar dorsiflexion bilaterally  Psychiatric:        Mood and Affect: Mood normal.    ED Results / Procedures / Treatments   Labs (all labs ordered are listed, but only abnormal results are displayed) Labs Reviewed  COMPREHENSIVE METABOLIC PANEL - Abnormal; Notable for the following components:      Result Value   Glucose, Bld 103 (*)    All other components within normal limits  CBC WITH DIFFERENTIAL/PLATELET - Abnormal; Notable for the following components:   WBC 11.1 (*)    RBC 3.63 (*)    Hemoglobin 8.6 (*)    HCT 29.9 (*)    MCH 23.7 (*)    MCHC 28.8 (*)    RDW 18.8 (*)    All other components within normal limits  URINALYSIS, ROUTINE W REFLEX MICROSCOPIC - Abnormal; Notable for the following components:   APPearance CLOUDY (*)    Nitrite POSITIVE (*)    Leukocytes,Ua LARGE (*)    WBC, UA >50 (*)    Bacteria, UA MANY (*)    All other components within normal limits  RETICULOCYTES - Abnormal; Notable for the following components:   RBC. 3.56 (*)    Immature Retic Fract 40.3 (*)    All other components within normal limits  URINE CULTURE  RESP PANEL BY RT-PCR (FLU A&B, COVID) ARPGX2  MAGNESIUM  TSH  LEVETIRACETAM LEVEL  LIPID PANEL  HEMOGLOBIN A1C  VITAMIN B12  AMMONIA  FOLATE  IRON AND TIBC  FERRITIN  POC OCCULT BLOOD, ED    EKG EKG Interpretation  Date/Time:  Wednesday March 15 2021 00:44:17 EDT Ventricular Rate:  87 PR Interval:  154 QRS Duration: 80 QT Interval:  386 QTC Calculation: 464 R Axis:   62 Text Interpretation: Normal sinus rhythm Cannot rule out Anterior infarct , age undetermined Abnormal ECG Confirmed by Ripley Fraise [phone]) on 03/15/2021 12:52:07 AM  Radiology CT Head Wo Contrast  Result Date: 03/15/2021 CLINICAL DATA:  Multiple recent falls EXAM: CT HEAD WITHOUT CONTRAST CT CERVICAL SPINE  WITHOUT CONTRAST TECHNIQUE: Multidetector CT imaging of the head and cervical spine was performed following the standard protocol without intravenous contrast. Multiplanar CT image reconstructions of the cervical spine were also generated. COMPARISON:  12/06/2019 FINDINGS: CT HEAD FINDINGS Brain: No evidence of acute infarction, hemorrhage, hydrocephalus, extra-axial collection or mass lesion/mass effect. Chronic atrophic and ischemic changes are noted similar to that seen on the prior exam. Vascular: No hyperdense vessel or unexpected calcification. Skull: Normal. Negative for fracture or focal lesion. Sinuses/Orbits: No acute finding. Other: None. CT CERVICAL SPINE FINDINGS Alignment: Mild loss of the normal cervical lordosis is noted likely related to muscular spasm. Skull base and vertebrae: 7 cervical segments are well visualized. Vertebral body height is well maintained. Multilevel osteophytic changes are seen. Facet hypertrophic changes are noted throughout the cervical spine. No findings to suggest acute fracture or acute facet abnormality are noted. The odontoid is within normal limits. Soft tissues and spinal canal: Surrounding soft tissue structures are within normal limits. Upper chest: Visualized lung apices are within normal limits. Other: None IMPRESSION: CT of the head: Chronic atrophic and ischemic changes stable from the prior study. CT of the cervical spine: Multilevel degenerative change without acute abnormality. Electronically Signed   By: Inez Catalina M.D.   On: 03/15/2021 01:51   CT Cervical Spine Wo Contrast  Result Date: 03/15/2021 CLINICAL DATA:  Multiple recent falls EXAM: CT HEAD WITHOUT CONTRAST CT CERVICAL SPINE WITHOUT CONTRAST TECHNIQUE: Multidetector CT  imaging of the head and cervical spine was performed following the standard protocol without intravenous contrast. Multiplanar CT image reconstructions of the cervical spine were also generated. COMPARISON:  12/06/2019 FINDINGS:  CT HEAD FINDINGS Brain: No evidence of acute infarction, hemorrhage, hydrocephalus, extra-axial collection or mass lesion/mass effect. Chronic atrophic and ischemic changes are noted similar to that seen on the prior exam. Vascular: No hyperdense vessel or unexpected calcification. Skull: Normal. Negative for fracture or focal lesion. Sinuses/Orbits: No acute finding. Other: None. CT CERVICAL SPINE FINDINGS Alignment: Mild loss of the normal cervical lordosis is noted likely related to muscular spasm. Skull base and vertebrae: 7 cervical segments are well visualized. Vertebral body height is well maintained. Multilevel osteophytic changes are seen. Facet hypertrophic changes are noted throughout the cervical spine. No findings to suggest acute fracture or acute facet abnormality are noted. The odontoid is within normal limits. Soft tissues and spinal canal: Surrounding soft tissue structures are within normal limits. Upper chest: Visualized lung apices are within normal limits. Other: None IMPRESSION: CT of the head: Chronic atrophic and ischemic changes stable from the prior study. CT of the cervical spine: Multilevel degenerative change without acute abnormality. Electronically Signed   By: Inez Catalina M.D.   On: 03/15/2021 01:51   DG Pelvis Portable  Result Date: 03/15/2021 CLINICAL DATA:  Multiple recent falls with pelvic pain, initial encounter EXAM: PORTABLE PELVIS 1-2 VIEWS COMPARISON:  12/19/2017 FINDINGS: Changes of prior vertebral augmentation are noted at L5. Pelvic ring appears intact. No acute fracture or dislocation is noted. No soft tissue abnormality is seen. IMPRESSION: No acute abnormality noted. Electronically Signed   By: Inez Catalina M.D.   On: 03/15/2021 01:42   DG Chest Portable 1 View  Result Date: 03/15/2021 CLINICAL DATA:  Multiple recent falls with chest pain, initial encounter EXAM: PORTABLE CHEST 1 VIEW COMPARISON:  02/13/2020 FINDINGS: The heart size and mediastinal contours  are within normal limits. Both lungs are clear. The visualized skeletal structures are unremarkable. IMPRESSION: No active disease. Electronically Signed   By: Inez Catalina M.D.   On: 03/15/2021 01:42    Procedures Procedures   Medications Ordered in ED Medications  magnesium sulfate IVPB 1 g 100 mL (1 g Intravenous New Bag/Given 03/15/21 0525)  HYDROcodone-acetaminophen (NORCO/VICODIN) 5-325 MG per tablet 1 tablet (1 tablet Oral Given 03/15/21 0237)  cefTRIAXone (ROCEPHIN) 1 g in sodium chloride 0.9 % 100 mL IVPB (0 g Intravenous Stopped 03/15/21 0526)  fentaNYL (SUBLIMAZE) injection 50 mcg (50 mcg Intravenous Given 03/15/21 0522)    ED Course  I have reviewed the triage vital signs and the nursing notes.  Pertinent labs & imaging results that were available during my care of the patient were reviewed by me and considered in my medical decision making (see chart for details).    MDM Rules/Calculators/A&P                         Patient presents to the ED with complaints of lower extremity weakness, intermittent dizziness, and multiple falls. Nontoxic, vitals without significant abnormality. Plan for labs, CT head/Cspine imaging as well as portable chest/pelvis x-rays. Consult placed to teleneurology.   Additional history obtained:  Additional history obtained from chart review & nursing note review.   Lab Tests:  I Ordered, reviewed, and interpreted labs, which included:  CBC: Mild leukocytosis, anemia with a hemoglobin of 8.6 and hematocrit of 29.9, this is decreased from prior record from a year ago, per  patient and her husband her hemoglobin has been low, they are unsure what its been, she was started on oral iron supplement about a week ago-she has been checking her stool at home with fecal occult testing, first 1 was positive, has since had to negative. CMP: Unremarkable Magnesium: Lower limit of normal. Urinalysis: Nitrite positive with large leukocytes and many bacteria.  She does  have a suprapubic catheter, however her urine collected at urology recently does not appear that this infected, culture at that time was sensitive to cephalosporins.  Imaging Studies ordered:  I ordered imaging studies which included CT head/Cspine & X-rays of the chest/pelvis, I independently reviewed, formal radiology impression shows:  CT head/Cspine:  CT of the head: Chronic atrophic and ischemic changes stable from the prior study. CT of the cervical spine: Multilevel degenerative change without acute abnormality. CXR:  No active disease. Pelvic X-ray: No acute abnormality noted.   ED Course:  Patient seen by teleneurology: Per teleneurology recommendations:  Diagnostic Studies: Recommend MRI brain without contrast Laboratory Studies: Recommend Lipid panel Hemoglobin A1c Nursing Recommendations: Telemetry, IV Fluids, avoid dextrose containing fluids, Maintain euglycemia Neuro checks q4 hrs x 24 hrs and then per shift Head of bed 30 degrees Consultations: Recommend Speech therapy if failed dysphagia screen Physical therapy/Occupational therapy DVT Prophylaxis: Choice of Primary Team Disposition: Neurology will follow Additional Recommendations: can continue xarelto r/o uti - check UA - pt w mild leukocytosis - monitor for worsening check ammonia level, b12 level, mg replacement - pt w hypomagnesemia  Additional labs and MRI ordered.  Magnesium ordered.  Rocephin started to cover for UTI, culture sent, patient has an allergy to penicillin but has tolerated Rocephin previously in the past per chart review.  04:18: CONSULT: Discussed with hospitalist Dr. Josephine Cables who accepts admission.   This is a shared visit with supervising physician Dr. Thalia Party who has independently evaluated patient & provided guidance in evaluation/management/disposition, in agreement with care   Portions of this note were generated with Dragon dictation software. Dictation errors may occur despite best  attempts at proofreading.  Final Clinical Impression(s) / ED Diagnoses Final diagnoses:  Weakness  Acute cystitis with hematuria  Anemia, unspecified type  Recurrent falls    Rx / DC Orders ED Discharge Orders     None        Amaryllis Dyke, PA-C 03/15/21 0546    Ripley Fraise, MD 03/15/21 [phone]

## 2021-03-14 NOTE — ED Triage Notes (Signed)
Patient arrives via EMS from home with complaint of multiple recent falls after new diagnosis of vertigo. Pt endorses generalized weakness in the legs and reports that her legs feel like they gave out today. Pt's husband reports pt slid down to the floor using the wall and her walker. No LOC, did not hit head, taking Xarelto, hx of anemia.   100 palpated HR 92 RR 18 SPO2 94 RA CBG 120

## 2021-03-15 ENCOUNTER — Inpatient Hospital Stay (HOSPITAL_COMMUNITY): Payer: PPO

## 2021-03-15 ENCOUNTER — Emergency Department (HOSPITAL_COMMUNITY): Payer: PPO

## 2021-03-15 ENCOUNTER — Encounter (HOSPITAL_COMMUNITY): Payer: Self-pay | Admitting: Internal Medicine

## 2021-03-15 DIAGNOSIS — E876 Hypokalemia: Secondary | ICD-10-CM | POA: Diagnosis present

## 2021-03-15 DIAGNOSIS — Z90710 Acquired absence of both cervix and uterus: Secondary | ICD-10-CM | POA: Diagnosis not present

## 2021-03-15 DIAGNOSIS — Z79899 Other long term (current) drug therapy: Secondary | ICD-10-CM | POA: Diagnosis not present

## 2021-03-15 DIAGNOSIS — K5900 Constipation, unspecified: Secondary | ICD-10-CM | POA: Diagnosis present

## 2021-03-15 DIAGNOSIS — Z8249 Family history of ischemic heart disease and other diseases of the circulatory system: Secondary | ICD-10-CM | POA: Diagnosis not present

## 2021-03-15 DIAGNOSIS — N319 Neuromuscular dysfunction of bladder, unspecified: Secondary | ICD-10-CM | POA: Diagnosis present

## 2021-03-15 DIAGNOSIS — R269 Unspecified abnormalities of gait and mobility: Secondary | ICD-10-CM

## 2021-03-15 DIAGNOSIS — Z87440 Personal history of urinary (tract) infections: Secondary | ICD-10-CM | POA: Diagnosis not present

## 2021-03-15 DIAGNOSIS — G0490 Encephalitis and encephalomyelitis, unspecified: Secondary | ICD-10-CM | POA: Diagnosis not present

## 2021-03-15 DIAGNOSIS — M5416 Radiculopathy, lumbar region: Secondary | ICD-10-CM | POA: Diagnosis present

## 2021-03-15 DIAGNOSIS — R531 Weakness: Secondary | ICD-10-CM

## 2021-03-15 DIAGNOSIS — G9340 Encephalopathy, unspecified: Secondary | ICD-10-CM | POA: Diagnosis present

## 2021-03-15 DIAGNOSIS — T83518A Infection and inflammatory reaction due to other urinary catheter, initial encounter: Secondary | ICD-10-CM | POA: Diagnosis present

## 2021-03-15 DIAGNOSIS — N3001 Acute cystitis with hematuria: Secondary | ICD-10-CM | POA: Diagnosis not present

## 2021-03-15 DIAGNOSIS — D649 Anemia, unspecified: Secondary | ICD-10-CM | POA: Diagnosis present

## 2021-03-15 DIAGNOSIS — Y846 Urinary catheterization as the cause of abnormal reaction of the patient, or of later complication, without mention of misadventure at the time of the procedure: Secondary | ICD-10-CM | POA: Diagnosis present

## 2021-03-15 DIAGNOSIS — G894 Chronic pain syndrome: Secondary | ICD-10-CM | POA: Diagnosis present

## 2021-03-15 DIAGNOSIS — G8222 Paraplegia, incomplete: Secondary | ICD-10-CM | POA: Diagnosis present

## 2021-03-15 DIAGNOSIS — Z86711 Personal history of pulmonary embolism: Secondary | ICD-10-CM | POA: Diagnosis not present

## 2021-03-15 DIAGNOSIS — G9341 Metabolic encephalopathy: Secondary | ICD-10-CM | POA: Diagnosis present

## 2021-03-15 DIAGNOSIS — Z87891 Personal history of nicotine dependence: Secondary | ICD-10-CM | POA: Diagnosis not present

## 2021-03-15 DIAGNOSIS — W19XXXA Unspecified fall, initial encounter: Secondary | ICD-10-CM | POA: Diagnosis present

## 2021-03-15 DIAGNOSIS — R296 Repeated falls: Secondary | ICD-10-CM

## 2021-03-15 DIAGNOSIS — Z20822 Contact with and (suspected) exposure to covid-19: Secondary | ICD-10-CM | POA: Diagnosis present

## 2021-03-15 DIAGNOSIS — D696 Thrombocytopenia, unspecified: Secondary | ICD-10-CM | POA: Diagnosis present

## 2021-03-15 DIAGNOSIS — Z8661 Personal history of infections of the central nervous system: Secondary | ICD-10-CM | POA: Diagnosis not present

## 2021-03-15 DIAGNOSIS — K529 Noninfective gastroenteritis and colitis, unspecified: Secondary | ICD-10-CM | POA: Diagnosis present

## 2021-03-15 DIAGNOSIS — Z7901 Long term (current) use of anticoagulants: Secondary | ICD-10-CM | POA: Diagnosis not present

## 2021-03-15 DIAGNOSIS — Z86718 Personal history of other venous thrombosis and embolism: Secondary | ICD-10-CM | POA: Diagnosis not present

## 2021-03-15 LAB — COMPREHENSIVE METABOLIC PANEL
ALT: 17 U/L (ref 0–44)
AST: 26 U/L (ref 15–41)
Albumin: 3.5 g/dL (ref 3.5–5.0)
Alkaline Phosphatase: 82 U/L (ref 38–126)
Anion gap: 7 (ref 5–15)
BUN: 14 mg/dL (ref 8–23)
CO2: 30 mmol/L (ref 22–32)
Calcium: 8.9 mg/dL (ref 8.9–10.3)
Chloride: 102 mmol/L (ref 98–111)
Creatinine, Ser: 0.81 mg/dL (ref 0.44–1.00)
GFR, Estimated: >60 mL/min (ref >60)
Glucose, Bld: 103 mg/dL — ABNORMAL HIGH (ref 70–99)
Potassium: 4.1 mmol/L (ref 3.5–5.1)
Sodium: 139 mmol/L (ref 135–145)
Total Bilirubin: 0.4 mg/dL (ref 0.3–1.2)
Total Protein: 6.9 g/dL (ref 6.5–8.1)

## 2021-03-15 LAB — CBC WITH DIFFERENTIAL/PLATELET
Abs Immature Granulocytes: 0.05 10*3/uL (ref 0.00–0.07)
Basophils Absolute: 0.1 10*3/uL (ref 0.0–0.1)
Basophils Relative: 1 %
Eosinophils Absolute: 0.3 10*3/uL (ref 0.0–0.5)
Eosinophils Relative: 3 %
HCT: 29.9 % — ABNORMAL LOW (ref 36.0–46.0)
Hemoglobin: 8.6 g/dL — ABNORMAL LOW (ref 12.0–15.0)
Immature Granulocytes: 1 %
Lymphocytes Relative: 27 %
Lymphs Abs: 3 10*3/uL (ref 0.7–4.0)
MCH: 23.7 pg — ABNORMAL LOW (ref 26.0–34.0)
MCHC: 28.8 g/dL — ABNORMAL LOW (ref 30.0–36.0)
MCV: 82.4 fL (ref 80.0–100.0)
Monocytes Absolute: 1 10*3/uL (ref 0.1–1.0)
Monocytes Relative: 9 %
Neutro Abs: 6.7 10*3/uL (ref 1.7–7.7)
Neutrophils Relative %: 59 %
Platelets: 249 10*3/uL (ref 150–400)
RBC: 3.63 MIL/uL — ABNORMAL LOW (ref 3.87–5.11)
RDW: 18.8 % — ABNORMAL HIGH (ref 11.5–15.5)
WBC: 11.1 10*3/uL — ABNORMAL HIGH (ref 4.0–10.5)
nRBC: 0.2 % (ref 0.0–0.2)

## 2021-03-15 LAB — URINALYSIS, ROUTINE W REFLEX MICROSCOPIC
Bilirubin Urine: NEGATIVE
Glucose, UA: NEGATIVE mg/dL
Hgb urine dipstick: NEGATIVE
Ketones, ur: NEGATIVE mg/dL
Nitrite: POSITIVE — AB
Protein, ur: NEGATIVE mg/dL
Specific Gravity, Urine: 1.013 (ref 1.005–1.030)
WBC, UA: >50 WBC/hpf — ABNORMAL HIGH (ref 0–5)
pH: 5 (ref 5.0–8.0)

## 2021-03-15 LAB — VITAMIN B12: Vitamin B-12: 2030 pg/mL — ABNORMAL HIGH (ref 180–914)

## 2021-03-15 LAB — IRON AND TIBC
Iron: 28 ug/dL (ref 28–170)
Saturation Ratios: 6 % — ABNORMAL LOW (ref 10.4–31.8)
TIBC: 469 ug/dL — ABNORMAL HIGH (ref 250–450)
UIBC: 441 ug/dL

## 2021-03-15 LAB — HEMOGLOBIN A1C
Hgb A1c MFr Bld: 5.9 % — ABNORMAL HIGH (ref 4.8–5.6)
Mean Plasma Glucose: 122.63 mg/dL

## 2021-03-15 LAB — FERRITIN: Ferritin: 5 ng/mL — ABNORMAL LOW (ref 11–307)

## 2021-03-15 LAB — LIPID PANEL
Cholesterol: 172 mg/dL (ref 0–200)
HDL: 67 mg/dL (ref >40)
LDL Cholesterol: 84 mg/dL (ref 0–99)
Total CHOL/HDL Ratio: 2.6 RATIO
Triglycerides: 107 mg/dL (ref <150)
VLDL: 21 mg/dL (ref 0–40)

## 2021-03-15 LAB — RESP PANEL BY RT-PCR (FLU A&B, COVID) ARPGX2
Influenza A by PCR: NEGATIVE
Influenza B by PCR: NEGATIVE
SARS Coronavirus 2 by RT PCR: NEGATIVE

## 2021-03-15 LAB — RETICULOCYTES
Immature Retic Fract: 40.3 % — ABNORMAL HIGH (ref 2.3–15.9)
RBC.: 3.56 MIL/uL — ABNORMAL LOW (ref 3.87–5.11)
Retic Count, Absolute: 100 10*3/uL (ref 19.0–186.0)
Retic Ct Pct: 2.8 % (ref 0.4–3.1)

## 2021-03-15 LAB — AMMONIA: Ammonia: 22 umol/L (ref 9–35)

## 2021-03-15 LAB — FOLATE: Folate: 13.7 ng/mL (ref >5.9)

## 2021-03-15 LAB — MAGNESIUM: Magnesium: 1.8 mg/dL (ref 1.7–2.4)

## 2021-03-15 LAB — TSH: TSH: 1.817 u[IU]/mL (ref 0.350–4.500)

## 2021-03-15 MED ORDER — POLYETHYLENE GLYCOL 3350 17 G PO PACK: 17.0000 g | PACK | Freq: Every day | ORAL | Status: DC | PRN

## 2021-03-15 MED ORDER — ENOXAPARIN SODIUM 40 MG/0.4ML IJ SOSY
40.0000 mg | PREFILLED_SYRINGE | INTRAMUSCULAR | Status: DC
  Administered 2021-03-15: 40 mg via SUBCUTANEOUS
  Filled 2021-03-15: qty 0.4

## 2021-03-15 MED ORDER — ACETAMINOPHEN 325 MG PO TABS: 650.0000 mg | ORAL_TABLET | Freq: Four times a day (QID) | ORAL | Status: DC | PRN

## 2021-03-15 MED ORDER — HYDROCODONE-ACETAMINOPHEN 5-325 MG PO TABS
1.0000 | ORAL_TABLET | Freq: Once | ORAL | Status: AC
  Administered 2021-03-15: 1 via ORAL
  Filled 2021-03-15: qty 1

## 2021-03-15 MED ORDER — QUETIAPINE FUMARATE 100 MG PO TABS
100.0000 mg | ORAL_TABLET | Freq: Every day | ORAL | Status: DC
  Administered 2021-03-15 – 2021-03-16 (×2): 100 mg via ORAL
  Filled 2021-03-15 (×2): qty 1

## 2021-03-15 MED ORDER — ONDANSETRON HCL 4 MG PO TABS: 4.0000 mg | ORAL_TABLET | Freq: Four times a day (QID) | ORAL | Status: DC | PRN

## 2021-03-15 MED ORDER — FERROUS SULFATE 325 (65 FE) MG PO TABS
325.0000 mg | ORAL_TABLET | Freq: Every day | ORAL | Status: DC
  Administered 2021-03-15 – 2021-03-17 (×3): 325 mg via ORAL
  Filled 2021-03-15 (×3): qty 1

## 2021-03-15 MED ORDER — SODIUM CHLORIDE 0.9 % IV SOLN
1.0000 g | Freq: Every day | INTRAVENOUS | Status: DC
  Administered 2021-03-16: 1 g via INTRAVENOUS
  Filled 2021-03-15: qty 10

## 2021-03-15 MED ORDER — COLESTIPOL HCL 1 G PO TABS
2.0000 g | ORAL_TABLET | Freq: Two times a day (BID) | ORAL | Status: DC
  Administered 2021-03-15: 2 g via ORAL
  Filled 2021-03-15 (×2): qty 2

## 2021-03-15 MED ORDER — ONDANSETRON HCL 4 MG/2ML IJ SOLN: 4.0000 mg | Freq: Four times a day (QID) | INTRAMUSCULAR | Status: DC | PRN

## 2021-03-15 MED ORDER — ACYCLOVIR 400 MG PO TABS
400.0000 mg | ORAL_TABLET | Freq: Two times a day (BID) | ORAL | Status: DC
  Administered 2021-03-15 – 2021-03-17 (×4): 400 mg via ORAL
  Filled 2021-03-15 (×4): qty 1

## 2021-03-15 MED ORDER — ACETAMINOPHEN 650 MG RE SUPP: 650.0000 mg | Freq: Four times a day (QID) | RECTAL | Status: DC | PRN

## 2021-03-15 MED ORDER — GADOBUTROL 1 MMOL/ML IV SOLN
7.0000 mL | Freq: Once | INTRAVENOUS | Status: AC | PRN
  Administered 2021-03-15: 7 mL via INTRAVENOUS

## 2021-03-15 MED ORDER — HAIR/SKIN/NAILS/BIOTIN PO TABS: 1.0000 | ORAL_TABLET | Freq: Every day | ORAL | Status: DC

## 2021-03-15 MED ORDER — MORPHINE SULFATE (PF) 2 MG/ML IV SOLN
2.0000 mg | INTRAVENOUS | Status: DC | PRN
  Administered 2021-03-15: 2 mg via INTRAVENOUS
  Filled 2021-03-15: qty 1

## 2021-03-15 MED ORDER — DULOXETINE HCL 60 MG PO CPEP
60.0000 mg | ORAL_CAPSULE | Freq: Every day | ORAL | Status: DC
  Administered 2021-03-15 – 2021-03-16 (×2): 60 mg via ORAL
  Filled 2021-03-15 (×2): qty 1

## 2021-03-15 MED ORDER — PREDNISONE 5 MG PO TABS
5.0000 mg | ORAL_TABLET | Freq: Every day | ORAL | Status: DC
  Administered 2021-03-15 – 2021-03-17 (×3): 5 mg via ORAL
  Filled 2021-03-15 (×3): qty 1

## 2021-03-15 MED ORDER — PANTOPRAZOLE SODIUM 40 MG PO TBEC
40.0000 mg | DELAYED_RELEASE_TABLET | Freq: Every day | ORAL | Status: DC
  Administered 2021-03-15 – 2021-03-16 (×2): 40 mg via ORAL

## 2021-03-15 MED ORDER — PREGABALIN 50 MG PO CAPS
100.0000 mg | ORAL_CAPSULE | Freq: Every day | ORAL | Status: DC
  Administered 2021-03-15 – 2021-03-16 (×2): 100 mg via ORAL
  Filled 2021-03-15 (×2): qty 2

## 2021-03-15 MED ORDER — RIVAROXABAN 20 MG PO TABS
20.0000 mg | ORAL_TABLET | Freq: Every day | ORAL | Status: DC
  Administered 2021-03-15 – 2021-03-16 (×2): 20 mg via ORAL
  Filled 2021-03-15 (×2): qty 1

## 2021-03-15 MED ORDER — DULOXETINE HCL 30 MG PO CPEP
30.0000 mg | ORAL_CAPSULE | Freq: Every day | ORAL | Status: DC
  Administered 2021-03-16 – 2021-03-17 (×2): 30 mg via ORAL
  Filled 2021-03-15 (×2): qty 1

## 2021-03-15 MED ORDER — SODIUM CHLORIDE 0.9 % IV SOLN: INTRAVENOUS | Status: DC

## 2021-03-15 MED ORDER — FENTANYL CITRATE (PF) 100 MCG/2ML IJ SOLN
50.0000 ug | Freq: Once | INTRAMUSCULAR | Status: AC
  Administered 2021-03-15: 50 ug via INTRAVENOUS
  Filled 2021-03-15: qty 2

## 2021-03-15 MED ORDER — DICLOFENAC SODIUM 1 % EX GEL
1.0000 "application " | Freq: Every day | CUTANEOUS | Status: DC | PRN
  Filled 2021-03-15: qty 100

## 2021-03-15 MED ORDER — MIRABEGRON ER 25 MG PO TB24
50.0000 mg | ORAL_TABLET | Freq: Every day | ORAL | Status: DC
  Administered 2021-03-15 – 2021-03-17 (×3): 50 mg via ORAL
  Filled 2021-03-15 (×3): qty 2

## 2021-03-15 MED ORDER — MAGNESIUM CITRATE PO SOLN: 1.0000 | Freq: Once | ORAL | Status: DC | PRN

## 2021-03-15 MED ORDER — LEVETIRACETAM 250 MG PO TABS
250.0000 mg | ORAL_TABLET | Freq: Two times a day (BID) | ORAL | Status: DC
  Administered 2021-03-15 – 2021-03-17 (×5): 250 mg via ORAL
  Filled 2021-03-15 (×6): qty 1

## 2021-03-15 MED ORDER — HYDROCODONE-ACETAMINOPHEN 5-325 MG PO TABS: 1.0000 | ORAL_TABLET | ORAL | Status: DC | PRN

## 2021-03-15 MED ORDER — BISACODYL 5 MG PO TBEC: 5.0000 mg | DELAYED_RELEASE_TABLET | Freq: Every day | ORAL | Status: DC | PRN

## 2021-03-15 MED ORDER — DIAZEPAM 5 MG PO TABS
5.0000 mg | ORAL_TABLET | Freq: Two times a day (BID) | ORAL | Status: DC
  Administered 2021-03-15 – 2021-03-17 (×4): 5 mg via ORAL
  Filled 2021-03-15 (×4): qty 1

## 2021-03-15 MED ORDER — SENNOSIDES-DOCUSATE SODIUM 8.6-50 MG PO TABS
1.0000 | ORAL_TABLET | Freq: Every evening | ORAL | Status: DC | PRN
  Administered 2021-03-16: 1 via ORAL
  Filled 2021-03-15: qty 1

## 2021-03-15 MED ORDER — MAGNESIUM SULFATE IN D5W 1-5 GM/100ML-% IV SOLN
1.0000 g | Freq: Once | INTRAVENOUS | Status: AC
  Administered 2021-03-15: 1 g via INTRAVENOUS
  Filled 2021-03-15: qty 100

## 2021-03-15 MED ORDER — OMEGA-3-ACID ETHYL ESTERS 1 G PO CAPS
1.0000 g | ORAL_CAPSULE | Freq: Every day | ORAL | Status: DC
  Administered 2021-03-15 – 2021-03-17 (×3): 1 g via ORAL
  Filled 2021-03-15 (×3): qty 1

## 2021-03-15 MED ORDER — SODIUM CHLORIDE 0.9 % IV SOLN
1.0000 g | Freq: Once | INTRAVENOUS | Status: AC
  Administered 2021-03-15: 1 g via INTRAVENOUS
  Filled 2021-03-15: qty 10

## 2021-03-15 NOTE — H&P (Signed)
History and Physical    Katelyn Lamb DTO:[id number] DOB: Oct 24, 1945 DOA: 03/14/2021  PCP: [name], MD  Patient coming from: Home  I have personally briefly reviewed patient's old medical records in [facility].  Chief Complaint: Falls and generalized weakness  HPI: Katelyn Lamb is a 75 y.o. 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  About 2-3 weeks ago she started having worsening diarrhea while taking medication for bowel prep for a planned GI CT scan; she was so weak they had to cancel the CT, and since then she has been progressively weaker.   Her current symptoms began approximately 03/10/2021, has been constantly present but progressively worsening.  Weakness location is both lower extremities involving the hips through the toes; neither leg is more weak than the other.  Weakness has become so bad that she is having trouble walking at all because her legs give out when she stands up. It took 2 people to get her to stand and pivot and get her to the toilet. Her husband said "It's like her feet are stuck to the floor." Several times she just slid down to the floor because her legs were too weak. Patient is now falling frequently and has had 6 falls recently and has hit her head during some of the falls. Her husband said the falls were very disconcerting and scared him; she is not usually like this. Weakness is generalized  but is worse in the lower extremities. Pain is chronic and located everywhere; it varies but can be up to  10/10 at times and is characterized as pins and needles. Symptoms are alleviated by nothing and exacerbated by standing or trying to walk.  Associated symptoms: Patient has had dizziness characterized as room spinning that has been intermittent and worse with position changes; this was present prior to current symptoms. Her husband reports sometimes she has her eyes going back and forth (nystagmus - horizontal) with the dizziness.Confusion intermittently; example given is that patient thought there were people in her home and they were not; issue is intermittent. Numbness/paresthesias of both lower extremities. She has chronic pain from her back and her chronic condition and it is about the same as baseline. In the last week she has had a significant worsening of her hearing in both ears. No abdominal pain, vomiting, or bloody stool; she does have hemorrhoids. Primary care doctor ordered hemoccult x 3; the first one was positive and then second 2 were negative. Mild wheezing lately. No shortness of breath or cough. No nasal congestion. No chest pain or palpitations. No hematuria. Has suprapubic catheter. Intermittent bifrontal headaches.   ED Course:  Noncontrast head CT, CT C-spine shows no acute process.  Chest x-ray shows no active disease.  Pelvic x-ray shows no acute abnormality.  Urinalysis was concerning for UTI.  Teleneurology was consulted.  Neurologist noted sensation abnormalities in both lower extremities more prominent proximally and bilateral dysmetria.  Neurologist suspects acute on chronic exacerbation with multifocal etiologies for gait impairment possible including infection, sequela of HSV  encephalomyelitis, worsening lumbar radiculopathy, or recent cerebellar stroke.  Neurologist noted mild tremor, bilateral dysmetria of the upper extremities, impaired HKS inconsistent  proximal/distal sensation  Review of Systems: As per HPI otherwise all other systems reviewed and are unremarable.  GENERAL: No Fever. Chronic chills. Positive for fatigue/malaise.  CARDIOVASCULAR: No chest pain or palpitations.    Past Medical History:  Diagnosis Date   Anxiety    Back pain    Chronic kidney disease    Gait abnormality 11/14/2016   Memory difficulty 03/02/2019   Myelitis due to herpes simplex [facility])     Past Surgical History:  Procedure Laterality Date   ABDOMINAL HYSTERECTOMY     BLADDER REPAIR     CESAREAN SECTION     IR KYPHO LUMBAR INC FX REDUCE BONE BX UNI/BIL CANNULATION INC/IMAGING  04/11/2018   TUBAL LIGATION      Social History  reports that she has quit smoking. She has never used smokeless tobacco. She reports that she does not drink alcohol and does not use drugs.  Allergies  Allergen Reactions   Demerol [Meperidine] Other (See Comments)    Hallucinations   Percocet [Oxycodone-Acetaminophen] Itching   Amoxicillin-Pot Clavulanate Diarrhea    Severe pain, headache, intestinal infection   Penicillins Itching and Rash    Has patient had a PCN reaction causing immediate rash, facial/tongue/throat swelling, SOB or lightheadedness with hypotension:  NO Has patient had a PCN reaction causing severe rash involving mucus membranes or skin necrosis: No Has patient had a PCN reaction that required hospitalization: No Has patient had a PCN reaction occurring within the last 10 years: Yes If all of the above answers are "NO", then may proceed with Cephalosporin use.    Family History  Problem Relation Age of Onset   Hypertension Mother      Home Medications  Prior to Admission medications   Medication Sig Start Date End Date Taking? Authorizing Provider  acyclovir (ZOVIRAX) 400 MG tablet TAKE 1 TABLET BY MOUTH TWICE A DAY 09/21/20   [name], MD  colestipol (COLESTID) 1 g tablet Take 2 g by mouth 2 (two) times daily.    [provider]  diazepam (VALIUM) 5 MG tablet TAKE 1 TABLET (5 MG TOTAL) BY MOUTH IN THE MORNING AND AT BEDTIME. 02/14/21   [name], MD  diclofenac Sodium (VOLTAREN) 1 % GEL Apply 1 application topically daily as needed (pain).    [provider]  diphenoxylate-atropine (LOMOTIL) 2.5-0.025 MG tablet Take 1-2 tablets by mouth 3 (three) times daily as needed for diarrhea or loose stools. 10/21/20   Pyrtle, Lajuan Lines, MD  DULoxetine (CYMBALTA) 30 MG capsule TAKE 1 CAPSULE BY MOUTH EVERY DAY IN THE MORNING 11/16/20   [name], MD  DULoxetine (CYMBALTA) 60 MG capsule TAKE 1 CAPSULE BY MOUTH EVERYDAY AT BEDTIME 02/27/21   [name], MD  HYDROcodone-acetaminophen [facility]) 10-325 MG tablet Take 1 tablet by mouth every 6 (six) hours as needed for pain. 03/07/18   [provider]  levETIRAcetam (KEPPRA) 250 MG tablet Take 1 tablet (250 mg total) by mouth 2 (two) times daily. 02/09/21   [name], MD  mirabegron ER (MYRBETRIQ) 50 MG TB24 tablet Take 50 mg by mouth daily.    [provider]  Multiple Vitamins-Minerals (HAIR/SKIN/NAILS/BIOTIN) TABS Take 1 tablet by mouth daily.    [provider]  omega-3 acid ethyl esters (LOVAZA) 1 g capsule Take 1 g by mouth daily.  [provider]  pantoprazole (PROTONIX) 40 MG tablet Take 1 tablet (40 mg total) by mouth daily. Patient taking differently: Take 40 mg by mouth at bedtime. 10/26/16   Angiulli, Lavon Paganini, PA-C  polyethylene glycol (MIRALAX / GLYCOLAX) packet Take 17 g by mouth daily. Patient taking differently: Take 17 g by mouth daily as needed (constipation). 10/26/16   Angiulli, Lavon Paganini, PA-C  predniSONE (DELTASONE) 5 MG tablet Take 1 tablet (5 mg total) by mouth daily with breakfast. 10/14/20   [name], MD  pregabalin (LYRICA) 50 MG capsule Take 2 capsules (100 mg total) by mouth at bedtime. 02/09/21   [name], MD  QUEtiapine (SEROQUEL) 25 MG tablet Take 4 tablets (100 mg  total) by mouth at bedtime as needed (sleep aid). 02/09/21   [name], MD  rivaroxaban (XARELTO) 20 MG TABS tablet Take 1 tablet (20 mg total) by mouth daily with supper. 03/06/20   Cherene Altes, MD    Physical Exam: Vitals:   03/15/21 0300 03/15/21 0634 03/15/21 0753 03/15/21 0828  BP: (!) 126/114 120/83 (!) 102/91 111/76  Pulse: 82 93 87 85  Resp: 20 20 16 17   Temp:   97.8 F (36.6 C) 97.9 F (36.6 C)  TempSrc:   Oral Oral  SpO2: 94% 94% 95% 95%    Constitutional: NAD, calm, comfortable, ill-appearing. Vitals:   03/15/21 0300 03/15/21 0634 03/15/21 0753 03/15/21 0828  BP: (!) 126/114 120/83 (!) 102/91 111/76  Pulse: 82 93 87 85  Resp: 20 20 16 17   Temp:   97.8 F (36.6 C) 97.9 F (36.6 C)  TempSrc:   Oral Oral  SpO2: 94% 94% 95% 95%   Eyes: Pupils equal and round, lids and conjunctivae without icterus or erythema. ENMT: Mucous membranes are dry. Posterior pharynx clear of any exudate or lesions. Nares patent without discharge or bleeding.  Normocephalic, atraumatic.  Normal dentition.  Neck: normal, supple, no masses, trachea midline.  Thyroid nontender, no masses appreciated, no thyromegaly. Respiratory: clear to auscultation bilaterally. Chest wall movements are symmetric. No wheezing, no crackles.  No rhonchi.  Normal respiratory effort. No accessory muscle use.  Cardiovascular: Regular rate and rhythm, no murmurs / rubs / gallops. Pulses: DP pulses 2+ bilaterally. No carotid bruits.  Capillary refill less than 3 seconds. Edema: None bilaterally. GI: soft, non-distended, normal active bowel sounds. No hepatosplenomegaly. No rigidity, rebound, or guarding. Non-tender. No masses palpated. Ecchymoses on right lower quadrant to periumbilical area; 2 cm area of firmness within the ecchymoses at approximately 3 cm distal to the umbilicus at the 0:93 position.  Musculoskeletal: no clubbing / cyanosis. No joint deformity upper and lower extremities. Good ROM, no  contractures. No deformity in the back bilaterally. Mild bilateral back tenderness.  Integument: no rashes, lesions, ulcers. No induration. Clean, dry, intact. Ecchymoses, scattered. Pale.  Neurologic: CN 2-12 grossly intact. Sensation grossly intact to light touch. DTR 1+ bilaterally.  Babinski: Toes nonreactive bilaterally.  Strength 4/5 in upper extremities; strength 3/5 in lower extremities.  Intact rapid alternating movements bilaterally.  No pronator drift. Psychiatric: Normal judgment and insight. Alert and oriented x 3. Normal mood.  Normal and appropriate affect.  GCS: Eye 4,  Verbal 5,  Motor 6.  Total 15  Lymphatic: No cervical lymphadenopathy. No supraclavicular lymphadenopathy.   Labs on Admission: I have personally reviewed the following labs and imaging studies.  CBC: Recent Labs  Lab 03/15/21 0125  WBC 11.1*  NEUTROABS 6.7  HGB 8.6*  HCT 29.9*  MCV 82.4  PLT 175    Basic Metabolic Panel: Recent Labs  Lab 03/15/21 0125  NA 139  K 4.1  CL 102  CO2 30  GLUCOSE 103*  BUN 14  CREATININE 0.81  CALCIUM 8.9  MG 1.8    GFR: CrCl cannot be calculated (Unknown ideal weight.).  Liver Function Tests: Recent Labs  Lab 03/15/21 0125  AST 26  ALT 17  ALKPHOS 82  BILITOT 0.4  PROT 6.9  ALBUMIN 3.5    Urine analysis:    Component Value Date/Time   COLORURINE YELLOW 03/15/2021 0241   APPEARANCEUR CLOUDY (A) 03/15/2021 0241   LABSPEC 1.013 03/15/2021 0241   PHURINE 5.0 03/15/2021 0241   GLUCOSEU NEGATIVE 03/15/2021 0241   HGBUR NEGATIVE 03/15/2021 0241   BILIRUBINUR NEGATIVE 03/15/2021 0241   KETONESUR NEGATIVE 03/15/2021 0241   PROTEINUR NEGATIVE 03/15/2021 0241   NITRITE POSITIVE (A) 03/15/2021 0241   LEUKOCYTESUR LARGE (A) 03/15/2021 0241    Radiological Exams on Admission: CT Head Wo Contrast  Result Date: 03/15/2021 CLINICAL DATA:  Multiple recent falls EXAM: CT HEAD WITHOUT CONTRAST CT CERVICAL SPINE WITHOUT CONTRAST TECHNIQUE: Multidetector CT  imaging of the head and cervical spine was performed following the standard protocol without intravenous contrast. Multiplanar CT image reconstructions of the cervical spine were also generated. COMPARISON:  12/06/2019 FINDINGS: CT HEAD FINDINGS Brain: No evidence of acute infarction, hemorrhage, hydrocephalus, extra-axial collection or mass lesion/mass effect. Chronic atrophic and ischemic changes are noted similar to that seen on the prior exam. Vascular: No hyperdense vessel or unexpected calcification. Skull: Normal. Negative for fracture or focal lesion. Sinuses/Orbits: No acute finding. Other: None. CT CERVICAL SPINE FINDINGS Alignment: Mild loss of the normal cervical lordosis is noted likely related to muscular spasm. Skull base and vertebrae: 7 cervical segments are well visualized. Vertebral body height is well maintained. Multilevel osteophytic changes are seen. Facet hypertrophic changes are noted throughout the cervical spine. No findings to suggest acute fracture or acute facet abnormality are noted. The odontoid is within normal limits. Soft tissues and spinal canal: Surrounding soft tissue structures are within normal limits. Upper chest: Visualized lung apices are within normal limits. Other: None IMPRESSION: CT of the head: Chronic atrophic and ischemic changes stable from the prior study. CT of the cervical spine: Multilevel degenerative change without acute abnormality. Electronically Signed   By: Inez Catalina M.D.   On: 03/15/2021 01:51   CT Cervical Spine Wo Contrast  Result Date: 03/15/2021 CLINICAL DATA:  Multiple recent falls EXAM: CT HEAD WITHOUT CONTRAST CT CERVICAL SPINE WITHOUT CONTRAST TECHNIQUE: Multidetector CT imaging of the head and cervical spine was performed following the standard protocol without intravenous contrast. Multiplanar CT image reconstructions of the cervical spine were also generated. COMPARISON:  12/06/2019 FINDINGS: CT HEAD FINDINGS Brain: No evidence of acute  infarction, hemorrhage, hydrocephalus, extra-axial collection or mass lesion/mass effect. Chronic atrophic and ischemic changes are noted similar to that seen on the prior exam. Vascular: No hyperdense vessel or unexpected calcification. Skull: Normal. Negative for fracture or focal lesion. Sinuses/Orbits: No acute finding. Other: None. CT CERVICAL SPINE FINDINGS Alignment: Mild loss of the normal cervical lordosis is noted likely related to muscular spasm. Skull base and vertebrae: 7 cervical segments are well visualized. Vertebral body height is well maintained. Multilevel osteophytic changes are seen. Facet hypertrophic changes are noted throughout the cervical spine. No findings to suggest acute fracture or acute facet abnormality are noted. The odontoid is within normal limits. Soft tissues  and spinal canal: Surrounding soft tissue structures are within normal limits. Upper chest: Visualized lung apices are within normal limits. Other: None IMPRESSION: CT of the head: Chronic atrophic and ischemic changes stable from the prior study. CT of the cervical spine: Multilevel degenerative change without acute abnormality. Electronically Signed   By: Inez Catalina M.D.   On: 03/15/2021 01:51   MR BRAIN WO CONTRAST  Result Date: 03/15/2021 CLINICAL DATA:  Dizziness EXAM: MRI HEAD WITHOUT CONTRAST TECHNIQUE: Multiplanar, multiecho pulse sequences of the brain and surrounding structures were obtained without intravenous contrast. COMPARISON:  02/02/2020 FINDINGS: Brain: There is no acute infarction or intracranial hemorrhage. There is no intracranial mass, mass effect, or edema. There is no hydrocephalus or extra-axial fluid collection. Prominence of the ventricles sulci reflects generalized parenchymal volume loss. Patchy and confluent areas of T2 hyperintensity in the supratentorial and pontine white matter are nonspecific but reflect moderate chronic microvascular ischemic changes. Appearance is similar to the  prior study. Vascular: Major vessel flow voids at the skull base are preserved. Skull and upper cervical spine: Normal marrow signal is preserved. Sinuses/Orbits: Paranasal sinuses are aerated. Bilateral lens replacements. Other: Sella is unremarkable. Chronic patchy right mastoid opacification. IMPRESSION: No evidence of recent infarction, hemorrhage, or mass. Moderate chronic microvascular ischemic changes. No substantial change from the prior study. Electronically Signed   By: Macy Mis M.D.   On: 03/15/2021 08:05   DG Pelvis Portable  Result Date: 03/15/2021 CLINICAL DATA:  Multiple recent falls with pelvic pain, initial encounter EXAM: PORTABLE PELVIS 1-2 VIEWS COMPARISON:  12/19/2017 FINDINGS: Changes of prior vertebral augmentation are noted at L5. Pelvic ring appears intact. No acute fracture or dislocation is noted. No soft tissue abnormality is seen. IMPRESSION: No acute abnormality noted. Electronically Signed   By: Inez Catalina M.D.   On: 03/15/2021 01:42   DG Chest Portable 1 View  Result Date: 03/15/2021 CLINICAL DATA:  Multiple recent falls with chest pain, initial encounter EXAM: PORTABLE CHEST 1 VIEW COMPARISON:  02/13/2020 FINDINGS: The heart size and mediastinal contours are within normal limits. Both lungs are clear. The visualized skeletal structures are unremarkable. IMPRESSION: No active disease. Electronically Signed   By: Inez Catalina M.D.   On: 03/15/2021 01:42    EKG: Independently reviewed. 87 bpm. Normal sinus rhythm.  Assessment/Plan  Principal Problem:    Neurologic gait dysfunction Etiology unclear. Appreciate neurology consult. Plan MRI  NPO until swallow evaluation.   Physical therapy, occupational therapy.  Neuro checks. Telemetry.  Neurology recommendations have been ordered including: Recommendations from neurologist include MRI of the brain without contrast Labs: Lipid panel, hemoglobin A1c, UA and urine culture, Ammonia, B12 Replace  magnesium Nursing: Telemetry, IV fluids but avoid dextrose containing fluids and maintain euglycemia Neurochecks every 4 hours x24 hours and then per shift Head of bed at 30 degrees Recommend speech therapy if failed dysphagia screen Physical therapy and Occupational Therapy Continued consultation with neurologist Continue home Xarelto    Active Problems:    Generalized weakness Plan: PT/OT.     Acute cystitis with hematuria Plan: Cultures. Empiric IV ceftriaxone.    Thrombocytopenia  History of thrombocytopenia. Plan: Monitor CBC and monitor for bleeding.    Anemia Acute on chronic. Plan: Monitor CBC. Monitor for any bleeding. Hemoccult stool.    Neuropathic pain Plan: Continue home pain medications.    Incomplete paraplegia (HCC) Worsening. Plan: PT/OT. Neurology consult. MRI.    Neurogenic bladder and   Urinary retention Plan: Continue suprapubic catheter.  DVT, lower extremity, distal, chronic (HCC) Plan: Continue home anticoagulant.    Encephalopathy Chronic memory issues with acute episodes of confusion. Likely multifactorial. Plan: Neuro checks. Neurology consult.    Recurrent falls Frequent. Plan: PT/OT.  Hypomagnesemia Mild. Plan: Replace.    DVT prophylaxis: Xarelto Code Status:   Full Code Family Communication:  Thera Flake, spouse, at bedside    Disposition Plan:   Patient is from:  Home  Anticipated DC to:  Home  Anticipated DC date:  03/19/21  Anticipated DC barriers: continued weakness  Consults called:    Neurology via Teleneurology in ED: Barron Schmid, MD Inpatient neurology: Lorenza Chick, MD  Admission status:  Inpatient   Severity of Illness: The appropriate patient status for this patient is INPATIENT. Inpatient status is judged to be reasonable and necessary in order to provide the required intensity of service to ensure the patient's safety. The patient's presenting symptoms, physical exam findings, and initial  radiographic and laboratory data in the context of their chronic comorbidities is felt to place them at high risk for further clinical deterioration. Furthermore, it is not anticipated that the patient will be medically stable for discharge from the hospital within 2 midnights of admission. The following factors support the patient status of inpatient.   " The patient's presenting symptoms include profound weakness and inability to walk. " The worrisome physical exam findings include leg weakness.. " The initial radiographic and laboratory data are worrisome because they do not indicate the cause of her weakness. Has a UTI and that could be contributing. " The chronic co-morbidities include chronic anticoagulation for history of PE, history of HSV encephalomyelitis.   * I certify that at the point of admission it is my clinical judgment that the patient will require inpatient hospital care sanning beyond 2 midnights from the point of admission due to high intensity of service, high risk for further deterioration and high frequency of surveillance required.*  03/19/21  Tacey Ruiz MD [facility]  How to contact the [facility] Attending or Consulting provider [place] or covering provider during after hours [place], for this patient?   Check the care team in [facility] and look for a) attending/consulting [facility] provider listed and b) the [facility] team listed Log into [email or web address] and use [facility]'s universal password to access. If you do not have the password, please contact the hospital operator. Locate the [facility] provider you are looking for under [facility] and page to a number that you can be directly reached. If you still have difficulty reaching the provider, please page the [facility] (Director on Call) for the Hospitalists listed on amion for assistance.  03/15/2021, 9:00 AM

## 2021-03-15 NOTE — Consult Note (Signed)
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 On exam, possible sensation > proximal more than distal but dysmetria noted b/l. Suspected acute on chronic exacerbation with multifactorial etiologies for gait impairment including possible infection (suspected infx/metabolic encephalopathy) with mild leukocytosis and hypomagnesemia r/o uti in patient with baseline lumbar radiculopathy and pre-existent disease related to HSV encephalomyelitis sequela; r/o cerebellar stroke (recent)  CT HEAD: Showed No Acute Hemorrhage or Acute Core Infarct IMPRESSION: CT of the head: Chronic atrophic and ischemic changes stable from the prior study.   CT of the cervical spine: Multilevel degenerative change without acute abnormality.  Our recommendations are outlined below.  Diagnostic Studies: Recommend MRI brain without contrast  Laboratory Studies: Recommend Lipid panel Hemoglobin A1c  Nursing Recommendations: Telemetry, IV Fluids, avoid dextrose containing fluids, Maintain euglycemia Neuro checks q4 hrs x 24 hrs and then per shift Head of bed 30 degrees  Consultations: Recommend Speech therapy if failed dysphagia screen Physical therapy/Occupational therapy  DVT Prophylaxis: Choice of Primary Team  Disposition: Neurology will follow  Additional Recommendations: can continue xarelto r/o uti - check UA - pt w mild leukocytosis - monitor for worsening check ammonia level, b12  level mg replacement - pt w hypomagnesemia   Imaging: IMPRESSION: CT of the head: Chronic atrophic and ischemic changes stable from the prior study.   CT of the cervical spine: Multilevel degenerative change without acute abnormality.  Labs: mg 1.8 wbc 11.1  Metrics: [facility] Notification Time: 03/15/2021 00:04:50 Stamp Time: 03/15/2021 00:08:34 Callback Response Time: 03/15/2021 00:10:53   ----------------------------------------------------------------------------------------------------  Chief Complaint: LE weakness  History of Present Illness: Patient is a 75 year old Female.  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 She has hit her head prior days this week but when she fell tonight she did not hit her head. Husband also reports confusion and seeing people are not there today and waking up at night confused. She has a neurologist who she sees as an outpatient.          Examination: BP(119//71), Pulse(85), Blood Glucose(103)  Neuro Exam: General: Alert,Awake, Oriented, Place, Person  Speech: Fluent:  Language: Intact:  Face: Symmetric:  Facial Sensation: Intact:  Visual Fields: Intact:  Extraocular Movements: Intact:  Motor Exam: No Drift:  Sensation: Reduced:  Coordination: Dysmetria: RUE, RLE, LUE, LLE  mild tremor and bilateral dysmetria UE and impaired HKS inconsistent prox/distal sensation - initially reported feeling more proximally but later similar to LT     Patient / Family was informed the Neurology Consult would occur via TeleHealth consult by way of interactive audio and video telecommunications and consented to receiving care  in this manner.  Patient is being evaluated for possible acute neurologic impairment and high probability of imminent or  life - threatening deterioration.I spent total of 32 minutes providing care to this patient, including time for face to face visit via telemedicine, review of medical records, imaging studies and discussion of findings with providers, the patient and / or family.   Dr Barron Schmid   [facility] [phone]  Case [id number]

## 2021-03-15 NOTE — Consult Note (Addendum)
Neurology Consultation Reason for Consult: Gait abnormality, trouble waking, falls Referring Physician: Dr Tacey Ruiz  CC: Falls, gait abnormality  History is obtained from: Patient, husband, chart review  HPI: Katelyn Lamb is a 75 y.o. right-handed female with past medical history of encephalomyelitis, DVT/PE on Xarelto, depression, lumbar radiculopathy, UTI, neurogenic bladder with suprapubic catheter, dependent on walker at baseline who presented with recurrent falls and worsening gait requiring two-person assistance for the last 4 to 5 days.  MRI brain was obtained which did not show any acute abnormality.  CT cervical spine showed multilevel degenerative changes without acute abnormality.  Blood work showed mild leukocytosis at 11.1.  Urine analysis showed more than 50 WBCs with many bacteria, large amount of leukocytes and positive for nitrites.  Urine cultures ordered and pending.  Of note, point-of-care urine dipstick only showed moderate leukocytes on 03/02/2021.  Neurology was consulted for further recommendations.  Patient and husband at bedside state she was scheduled for a CT colonoscopy and started taking bowel prep last Monday (03/06/2021). After first dose, she had severe diarrhea with multiple loose watery stools. She couldn't take the rest of the bowel prep next day and had to cancel her colonoscopy due to severe weakness because of frequent loose stools. Since then she has had alternating diarrhea and constipation which is common for her at baseline. She also had episode of nystagmus as noted by her physical therapist last week and was told it could be vertigo which has since improved. However, the weakness has persisted and now she is requiring 2 person assist to get around. Yesterday evening, husband couldn't help her by himself and had to call EMS. Of note, patient states on most day she is in 10/10 pain in both legs but does have days where its worse and has improvement at times.    I personally reviewed Dr. Tobey Grim note from clinic visit on 02/10/2019 summarized as follows.  Patient has history of chronic gait disorder due to HSV encephalomyelitis, has chronic balance issues and uses walker for ambulation but will still fall backwards on occasion.  She is on Lyrica 100 mg nightly as well as prednisone 5 mg daily for neuropathic pain as well as gait instability.  Keppra 250 mg twice daily was added during most recent appointment for neuropathic pain as well.   I also reviewed Dr Cecil Cobbs note from 09/20/2016 summarized as follows: patient presented 2 weeks of worsening back pain and lower extremity weakness/numbness and urinary changes. MRI C/R/L spine showed abnormal spinal cord signal and enhancement without discrete mass. 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 MRI Brain showed cortical diffusion and FLAIR signal abnormality with some increased sulcal FLAIR signal centered in the anterior paramedian frontal and cingulate gyri. 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 The pattern of meningeal and cortical inflammation is not typical of multiple sclerosis or neuromyelitis optica favoring an infectious or other autoimmune etiology. LP was positive for HSV-2PCR, WBC 490 ( 86% lymphs, 14% monos), RBC 34, Protein 53, Glucose 96,  negative were ACE, NMO titer, RA latex test, C-ANCA/P-ANCA, SSA/SSB, ribonucleic protein Ab and ENA SM Ab. HIV testing was negative. CSF VDRL was negative.B12 569. She was treated with IV steroids and acyclovir  She was also seen at [facility] in 2018. I have reviewed the note summarized as follows: Repeat LP showed 3 nuc cells, 6 RBCs, protein 38, glucose 58, 7 OCBs, IgG 3.4 (mildly elevated), NMO negative, meningitis PCR  negative, culture negative. Paraneoplastic from the CSF is pending. Cytology was not able to be obtained. Her serum autoimmune panel was negative. NMO in the  serum was also negative. Her CSF was perplexing as she previously had a very inflammatory CSF with HSV 2 positivity but there was no evidence of inflammation. Whipple PCR, syphilis screen were negative as was quant gold. Her ANA was 1:2560 speckled. However, RF, ANCA, Anti-sm, ro, la were negative. She had a CT chest that was negative for adenopathy (in evaluation for sarcoid). She did have a repeat MRI cervical and thoracic spine which demonstrated interval improvement in the enhancing lesion at C2-C4 and at t7-8. Again, these were improved but enhancement persisted. MRI brain was also repeated which re-demonstrated T2/FLAIR hyperintense foci w/i the periventricular and subcortical WM. This was reassuring; however, the residual enhancement was puzzling. 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 Sarcoid, demyelination (NMO/MS) were entertained but with absence of adenopathy and negative NMO, these were thought to be less likely. CRP, ferritin, folate, B12 were also normal. We treated the patient with 5 sessionsof plasmapheresis and solumedrol.  MRI Brain wo contrast 09/21/2020: Cortical diffusion and FLAIR signal abnormality with some increased sulcal FLAIR signal centered in the anterior paramedian frontal and cingulate gyri. 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 The pattern of meningeal and cortical inflammation is not typical of multiple sclerosis or neuromyelitis optica favoring an infectious or other autoimmune etiology. Consider follow-up with MRI of the brain with contrast. 2. Stable nonspecific T2 FLAIR hyperintense white matter foci may represent microvascular ischemic changes particularly in the setting of diabetes or hypertension or sequelae of demyelination, vasculitis, and other infectious/inflammatory processes.  MRI C spine  w and wo contrast 09/20/2020: Expansile T2 bright signal and patchy varying enhancement predominately involving the posterior columns C2 through C4-5 and more central within the spinal cord at C5 through C7-T1. No myelomalacia. No syrinx. No abnormal leptomeningeal or epidural enhancement.without discrete mass. 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  MRI T spine w and wo contrast 09/20/2020: Patchy mildly expansile central spinal cord T2 hyperintense signal and varying enhancement T1-2 through T2, T5-6 through T7-8 with predominant dorsal cord involvement. Patchy T2 hyperintense signal within the conus medullaris. No syrinx. No abnormal leptomeningeal or epidural enhancement. No myelomalacia.  MRI L spine wo contrast 09/20/2020: Re- absorption of L1-2 disc extrusion with small residual protrusion. Degenerative lumbar spine resulting in mild canal stenosis L4-5,similar grade 1 L4-5 anterolisthesis on degenerative basis. Neural foraminal narrowing L3-4 through L5-S1: Moderate to severe on the RIGHT at L4-5.  ROS: All other systems reviewed and negative except as noted in the HPI.   Past Medical History:  Diagnosis Date   Acute deep vein thrombosis (DVT) of popliteal vein of left lower extremity (HCC)    Anxiety    Back pain    Chronic kidney disease    Encephalomyelitis    Gait abnormality 11/14/2016   Memory difficulty 03/02/2019   Myelitis due to herpes simplex [facility])    Neurogenic bladder      Family History  Problem Relation Age of Onset   Hypertension Mother     Social History:  reports that she has quit smoking. She has never used smokeless tobacco. She reports that she does not drink alcohol and does not use drugs.   Medications Prior to Admission  Medication  Sig Dispense Refill Last Dose   acyclovir (ZOVIRAX) 400 MG tablet TAKE 1 TABLET BY MOUTH TWICE A DAY (Patient taking differently: Take 400 mg by mouth 2 (two) times daily.) 180 tablet 1 03/14/2021    diazepam (VALIUM) 5 MG tablet TAKE 1 TABLET (5 MG TOTAL) BY MOUTH IN THE MORNING AND AT BEDTIME. 180 tablet 1 03/14/2021   diclofenac Sodium (VOLTAREN) 1 % GEL Apply 1 application topically daily as needed (pain).   Past Month   DULoxetine (CYMBALTA) 30 MG capsule TAKE 1 CAPSULE BY MOUTH EVERY DAY IN THE MORNING (Patient taking differently: Take 30 mg by mouth in the morning.) 90 capsule 1 03/14/2021   DULoxetine (CYMBALTA) 60 MG capsule TAKE 1 CAPSULE BY MOUTH EVERYDAY AT BEDTIME (Patient taking differently: Take 60 mg by mouth at bedtime.) 90 capsule 1 03/13/2021   ferrous sulfate 325 (65 FE) MG tablet Take 325 mg by mouth daily.   03/14/2021   HYDROcodone-acetaminophen (NORCO) 10-325 MG tablet Take 1 tablet by mouth every 6 (six) hours as needed for pain.   Past Month   levETIRAcetam (KEPPRA) 250 MG tablet Take 1 tablet (250 mg total) by mouth 2 (two) times daily. 60 tablet 3 03/14/2021   loperamide (IMODIUM) 2 MG capsule Take 2 mg by mouth as needed for diarrhea or loose stools.   unknown   mirabegron ER (MYRBETRIQ) 50 MG TB24 tablet Take 50 mg by mouth daily.   03/14/2021   Multiple Vitamins-Minerals (HAIR/SKIN/NAILS/BIOTIN) TABS Take 1 tablet by mouth daily.   03/14/2021   omega-3 acid ethyl esters (LOVAZA) 1 g capsule Take 1 g by mouth daily.   03/14/2021   pantoprazole (PROTONIX) 40 MG tablet Take 1 tablet (40 mg total) by mouth daily. (Patient taking differently: Take 40 mg by mouth at bedtime.) 30 tablet 1 03/13/2021   polyethylene glycol (MIRALAX / GLYCOLAX) packet Take 17 g by mouth daily. (Patient taking differently: Take 17 g by mouth daily as needed (constipation).) 14 each 0 unknown   predniSONE (DELTASONE) 5 MG tablet Take 1 tablet (5 mg total) by mouth daily with breakfast. 90 tablet 1 03/14/2021   pregabalin (LYRICA) 50 MG capsule Take 2 capsules (100 mg total) by mouth at bedtime. 180 capsule 1 03/13/2021   QUEtiapine (SEROQUEL) 100 MG tablet Take 100 mg by mouth at bedtime.   03/13/2021    rivaroxaban (XARELTO) 20 MG TABS tablet Take 1 tablet (20 mg total) by mouth daily with supper. (Patient taking differently: Take 20 mg by mouth daily with lunch.) 30 tablet 1 03/14/2021 at 1200   colestipol (COLESTID) 1 g tablet Take 2 g by mouth 2 (two) times daily. (Patient not taking: Reported on 03/15/2021)   Not Taking   diphenoxylate-atropine (LOMOTIL) 2.5-0.025 MG tablet Take 1-2 tablets by mouth 3 (three) times daily as needed for diarrhea or loose stools. (Patient not taking: Reported on 03/15/2021) 30 tablet 0 Not Taking   QUEtiapine (SEROQUEL) 25 MG tablet Take 4 tablets (100 mg total) by mouth at bedtime as needed (sleep aid). (Patient not taking: No sig reported)   Not Taking      Exam: Current vital signs: BP 103/69 (BP Location: Left Arm)   Pulse 78   Temp 98.2 F (36.8 C) (Oral)   Resp 17   SpO2 94%  Vital signs in last 24 hours: Temp:  [97.8 F (36.6 C)-98.2 F (36.8 C)] 98.2 F (36.8 C) (07/13 1448) Pulse Rate:  [78-93] 78 (07/13 1448) Resp:  [16-20] 17 (07/13 1448)  BP: (102-126)/(69-114) 103/69 (07/13 1448) SpO2:  [94 %-96 %] 94 % (07/13 1448)   Physical Exam  Constitutional: Appears well-developed and well-nourished.  Psych: Affect appropriate to situation Eyes: No scleral injection HENT: No OP obstrucion Head: Normocephalic.  Cardiovascular: Normal rate and regular rhythm.  Respiratory: Effort normal, non-labored breathing GI: Soft.  No distension. There is no tenderness.  Skin: Warm  Neuro: Mental Status: Patient is awake, alert, oriented to person, place,time Patient is able to give a clear and coherent history. No signs of aphasia or neglect Cranial Nerves: II: Visual Fields are full. Pupils are equal, round, and reactive to light.   III,IV, VI: EOMI without ptosis or diploplia.  V: Facial sensation is symmetric to temperature VII: Facial movement is symmetric.  VIII: hearing is intact to voice X: Uvula elevates symmetrically XI: Shoulder shrug  is symmetric. XII: tongue is midline without atrophy or fasciculations.  Motor: Tone is normal. Bulk is normal. 5/5 strength was present in all four extremities.  Sensory: Reports paresthesias in BL lower extremity but no clear sensory level Deep Tendon Reflexes: 1+ and symmetric in the biceps and patellae.  Plantars: Toes are downgoing bilaterally.  Cerebellar: FNF intact bilaterally   I have reviewed labs in epic and the results pertinent to this consultation are: CBC:  Recent Labs  Lab 03/15/21 0125  WBC 11.1*  NEUTROABS 6.7  HGB 8.6*  HCT 29.9*  MCV 82.4  PLT 573    Basic Metabolic Panel:  Lab Results  Component Value Date   NA 139 03/15/2021   K 4.1 03/15/2021   CO2 30 03/15/2021   GLUCOSE 103 (H) 03/15/2021   BUN 14 03/15/2021   CREATININE 0.81 03/15/2021   CALCIUM 8.9 03/15/2021   GFRNONAA >60 03/15/2021   GFRAA >60 02/17/2020   Lipid Panel:  Lab Results  Component Value Date   LDLCALC 84 03/15/2021   HgbA1c:  Lab Results  Component Value Date   HGBA1C 5.9 (H) 03/15/2021   Urine Drug Screen: No results found for: LABOPIA, COCAINSCRNUR, LABBENZ, AMPHETMU, THCU, LABBARB  Alcohol Level No results found for: ETH   I have reviewed the images obtained: CT head without contrast 03/15/2021: Chronic atrophic and ischemic changes stable from  the prior study. CT cervical spine without contrast 03/15/2021: Multilevel degenerative change without acute abnormality. MRI brain without contrast 03/15/2021: No evidence of recent infarction, hemorrhage, or mass. Moderate chronic microvascular ischemic changes. No substantial change from the prior study.  ASSESSMENT/PLAN: 75 year old female with history of HSV encephalomyelitis in 2018 with baseline gait dysfunction requiring walker who presented with worsening gait and falls for the last few days in the setting of worsening diarrhea while taking medications for bowel prep for colonoscopy as well as possible UTI  Acute  on chronic gait dysfunction Chronic neuropathic pain History of HSV encephalomyelitis -Worsening gait could be due to possible UTI. DDX also include recurrence of prior disease vs autoimmune etiology due to the waxing and waning course  Recommendations: - Patient on antibiotic for UTI. Wil also order MRI C and T spine w and wo contrast to assess for any recurrence. - if no improvement after antibiotics or imaging shows active disease, will consider solumedrol 1000mg  x5 days - Continue current dose of prednisone, keppra and pregabalin - PT/OT  Thank you for allowing [place] to participate in the care of this patient. If you have any further questions, please contact  me or neurohospitalist.   I have spent a total of  150  minutes with the patient reviewing hospital notes,  test results, labs and examining the patient as well as establishing an assessment and plan that was discussed personally with the patient and husband at bedside.  > 50% of time was spent in direct patient care.   Zeb [facility]

## 2021-03-15 NOTE — ED Notes (Signed)
Patient transported to MRI 

## 2021-03-16 LAB — BASIC METABOLIC PANEL
Anion gap: 6 (ref 5–15)
BUN: 10 mg/dL (ref 8–23)
CO2: 27 mmol/L (ref 22–32)
Calcium: 8.4 mg/dL — ABNORMAL LOW (ref 8.9–10.3)
Chloride: 108 mmol/L (ref 98–111)
Creatinine, Ser: 0.77 mg/dL (ref 0.44–1.00)
GFR, Estimated: >60 mL/min (ref >60)
Glucose, Bld: 98 mg/dL (ref 70–99)
Potassium: 4 mmol/L (ref 3.5–5.1)
Sodium: 141 mmol/L (ref 135–145)

## 2021-03-16 LAB — CBC
HCT: 30.5 % — ABNORMAL LOW (ref 36.0–46.0)
Hemoglobin: 8.5 g/dL — ABNORMAL LOW (ref 12.0–15.0)
MCH: 23.3 pg — ABNORMAL LOW (ref 26.0–34.0)
MCHC: 27.9 g/dL — ABNORMAL LOW (ref 30.0–36.0)
MCV: 83.6 fL (ref 80.0–100.0)
Platelets: 216 10*3/uL (ref 150–400)
RBC: 3.65 MIL/uL — ABNORMAL LOW (ref 3.87–5.11)
RDW: 19.7 % — ABNORMAL HIGH (ref 11.5–15.5)
WBC: 9 10*3/uL (ref 4.0–10.5)
nRBC: 0 % (ref 0.0–0.2)

## 2021-03-16 LAB — URINE CULTURE

## 2021-03-16 MED ORDER — SENNOSIDES-DOCUSATE SODIUM 8.6-50 MG PO TABS
1.0000 | ORAL_TABLET | Freq: Two times a day (BID) | ORAL | Status: DC
  Administered 2021-03-16: 1 via ORAL
  Filled 2021-03-16 (×2): qty 1

## 2021-03-16 MED ORDER — SODIUM CHLORIDE 0.9 % IV SOLN: INTRAVENOUS | Status: DC

## 2021-03-16 MED ORDER — LIP MEDEX EX OINT
TOPICAL_OINTMENT | CUTANEOUS | Status: AC
  Filled 2021-03-16: qty 7

## 2021-03-16 MED ORDER — HYDROCORTISONE NA SUCCINATE PF 100 MG IJ SOLR
50.0000 mg | Freq: Once | INTRAMUSCULAR | Status: AC
  Administered 2021-03-16: 50 mg via INTRAVENOUS
  Filled 2021-03-16: qty 2

## 2021-03-16 MED ORDER — CEFDINIR 300 MG PO CAPS
300.0000 mg | ORAL_CAPSULE | Freq: Two times a day (BID) | ORAL | Status: DC
  Administered 2021-03-16 – 2021-03-17 (×2): 300 mg via ORAL
  Filled 2021-03-16 (×2): qty 1

## 2021-03-16 NOTE — Progress Notes (Signed)
[facility] Progress Note  Patient: Katelyn Lamb    XHB:[id number]  DOA: 03/14/2021     Date of Service: the patient was seen and examined on 03/16/2021  Brief hospital course: Past medical history of HSV encephalitis, incomplete paraplegia, PE and DVT, thrombocytopenia, neurogenic bladder SP suprapubic catheter, anemia, hyponatremia, IBS.  Presents with complaints of recurrent fall and generalized weakness. Suspected to have UTI.  Currently on IV antibiotics. Neurology consulted.  Work-up so far unremarkable. Currently plan is monitor overnight for blood pressure stabilization.  Subjective: Feeling better.  Close to normal.  No nausea no vomiting.  Improvement in leg weakness.  No diarrhea.  Catheter was not changed in ER.  Assessment and Plan: 1.  Ambulatory dysfunction Acute on chronic. Neurology was consulted. MRI brain negative for acute stroke. MRI spine negative for any acute abnormality or recurrence of transverse myelitis. Neurology signed off for now but recommend outpatient follow-up. Suspect there is also component of polypharmacy. Neurology thinks that UTI is contributing to her presentation and recommend to continue antibiotic for now.  2.  UTI, catheter associated Neurogenic bladder s/p suprapubic catheter. Catheter was exchanged 2 weeks ago. Symptom onset few days ago. No fever no chills.  No nausea no vomiting.  No leukocytosis. UA shows evidence of pyuria with nitrite positive. Started on IV ceftriaxone. Cultures so far shows multiple species. Catheter was not changed.  We will request RN to exchange the catheter.  3.  Chronic neuropathy pain Patient is on Lyrica and Cymbalta. Neurology is switching her to [place]. Also Valium. Monitor for now.  4.  Chronic thrombocytopenia. History of DVT. On therapeutic anticoagulation. Monitor.  5.  Incomplete paraplegia PT OT consulted.  Recommend home with home health.  Work-up with MRI negative.  6.   Hypokalemia, hypomagnesemia Replaced.  7.  Chronic diarrhea. Currently has constipation. Holding colestipol.  Scheduled Meds:  lip balm       acyclovir  400 mg Oral BID   cefdinir  300 mg Oral Q12H   diazepam  5 mg Oral BID   DULoxetine  30 mg Oral Daily   DULoxetine  60 mg Oral QHS   ferrous sulfate  325 mg Oral Daily   levETIRAcetam  250 mg Oral BID   mirabegron ER  50 mg Oral Daily   omega-3 acid ethyl esters  1 g Oral Daily   pantoprazole  40 mg Oral QHS   predniSONE  5 mg Oral Q breakfast   pregabalin  100 mg Oral QHS   QUEtiapine  100 mg Oral QHS   rivaroxaban  20 mg Oral Q supper   senna-docusate  1 tablet Oral BID   Continuous Infusions:  sodium chloride 75 mL/hr at 03/16/21 0823   PRN Meds: acetaminophen **OR** acetaminophen, diclofenac Sodium, HYDROcodone-acetaminophen, ondansetron **OR** ondansetron (ZOFRAN) IV, polyethylene glycol  Body mass index is 25.13 kg/m.    Pressure Injury 02/12/20 Heel Right Deep Tissue Pressure Injury - Purple or maroon localized area of discolored intact skin or blood-filled blister due to damage of underlying soft tissue from pressure and/or shear. (Active)  02/12/20 0945  Location: Heel  Location Orientation: Right  Staging: Deep Tissue Pressure Injury - Purple or maroon localized area of discolored intact skin or blood-filled blister due to damage of underlying soft tissue from pressure and/or shear.  Wound Description (Comments):   Present on Admission: Yes     DVT Prophylaxis:   SCDs Start: 03/15/21 1318 SCDs Start: 03/15/21 0857 rivaroxaban (XARELTO) tablet 20 mg  Advance goals of care discussion: Pt is Full code.  Family Communication: family was present at bedside, at the time of interview.  The pt provided permission to discuss medical plan with the family. Opportunity was given to ask question and all questions were answered satisfactorily.   Data Reviewed: I have personally reviewed and interpreted daily labs,  tele strips, imaging. Hemoglobin stable after initial drop. Electrolytes stable.  Physical Exam:  General: Appear in mild distress, no Rash; Oral Mucosa Clear, moist. no Abnormal Neck Mass Or lumps, Conjunctiva normal  Cardiovascular: S1 and S2 Present, no Murmur, Respiratory: good respiratory effort, Bilateral Air entry present and CTA, no Crackles, no wheezes Abdomen: Bowel Sound present, Soft and no tenderness Extremities: no Pedal edema Neurology: alert and oriented to time, place, and person affect appropriate. no new focal deficit Gait not checked due to patient safety concerns  Vitals:   03/15/21 2012 03/16/21 0434 03/16/21 0500 03/16/21 1424  BP: 107/71 98/72  110/77  Pulse: 83 90  95  Resp: 19 20  16   Temp: 98.4 F (36.9 C) 97.8 F (36.6 C)  97.8 F (36.6 C)  TempSrc: Oral Oral  Oral  SpO2: 92% 91%  94%  Weight:   68.5 kg   Height:       Disposition:  Status is: Inpatient  Remains inpatient appropriate because:Inpatient level of care appropriate due to severity of illness blood pressure still soft.  Need to consider stress dose steroids if remains persistently low.  Dispo: The patient is from: Home              Anticipated d/c is to: Home              Patient currently is not medically stable to d/c.   Difficult to place patient No        Time spent: 35 minutes. I reviewed all nursing notes, pharmacy notes, vitals, pertinent old records. I have discussed plan of care as described above with RN.  Author: Berle Mull, MD [facility] 03/16/2021 5:53 PM  To reach On-call, see care teams to locate the attending and reach out via [email or web address]. Between 7PM-7AM, please contact night-coverage If you still have difficulty reaching the attending provider, please page the [facility] (Director on Call) for [facility] on amion for assistance.

## 2021-03-16 NOTE — Evaluation (Signed)
Occupational Therapy Evaluation Patient Details Name: Katelyn Lamb MRN: [id number] DOB: 09-05-1945 Today's Date: 03/16/2021    History of Present Illness Patient admitted with neurological gait dysfunction. Katelyn Lamb is a 75 y.o. 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   Clinical Impression   Mrs. Katelyn Lamb is a 75 year old woman who presents with functional ROM and strength of upper extremities but bilateral dysmetria with fine and gross motor coordination - worse on the left. Patient needs min assistance with transfers and ambulation and assistance with ADLs. Overall patient exhibits functional lower body strength and able to ambulate from [facility] to recliner with walker with assistance of therapist. Patient's granddaughter in the room and reports the incoordination and impaired proprioception in hands in normal. At baseline patient has assistance for ADLs and ambulation - reports she's not supposed to get up by herself. They have all needed DME and patient has 24/7 supervision. Patient appears to be at her baseline. No OT needs at this time.     Follow Up Recommendations  No OT follow up    Equipment Recommendations  None recommended by OT    Recommendations for Other Services       Precautions / Restrictions Precautions Precautions: Fall Restrictions Weight Bearing Restrictions: No      Mobility Bed Mobility Overal  bed mobility: Needs Assistance Bed Mobility: Supine to Sit     Supine to sit: Min assist     General bed mobility comments: min assist for hand hold to transfer into sitting and scooting to edge of bed.    Transfers Overall transfer level: Needs assistance Equipment used: Rolling walker (2 wheeled) Transfers: Sit to/from [facility] Sit to Stand: Min assist;+2 safety/equipment Stand pivot transfers: Min assist;+2 safety/equipment       General transfer comment: Overall min assist to stand and ambulate with Rw from [facility] to recliner.    Balance Overall balance assessment: Needs assistance Sitting-balance support: No upper extremity supported Sitting balance-Leahy Scale: Fair     Standing balance support: During functional activity Standing balance-Leahy Scale: Poor                             ADL either performed or assessed with clinical judgement   ADL Overall ADL's : Needs assistance/impaired Eating/Feeding: Set up   Grooming: Set up;Oral care;Wash/dry face;Wash/dry hands;Sitting   Upper Body Bathing: Set up;Sitting   Lower Body Bathing: Moderate assistance;Sit to/from stand   Upper Body Dressing : Minimal assistance;Sitting   Lower Body Dressing: Moderate assistance;Sit to/from stand   Toilet Transfer: Minimal assistance;+2 for physical assistance;+2 for safety/equipment;BSC Toilet Transfer Details (indicate cue type and reason): Initially +2 to min assist to transfer to [facility] due to an abundance of caution from reports of weak lower extremities. Overall min assist for [facility] transfers and verbal cue for hand placement. Toileting- Clothing Manipulation and Hygiene: Moderate assistance;Sit to/from stand Toileting - Clothing Manipulation Details (indicate cue type and reason): need assistance for wiping  and clothing management     Functional mobility during ADLs: Minimal assistance;Cueing for safety;Cueing for sequencing;Rolling walker;+2 for  safety/equipment       Vision Patient Visual Report: No change from baseline       Perception     Praxis      Pertinent Vitals/Pain Pain Assessment: No/denies pain     Hand Dominance Right   Extremity/Trunk Assessment Upper Extremity Assessment Upper Extremity Assessment: RUE deficits/detail;LUE deficits/detail RUE Deficits / Details: WFL ROM; 4/5 shoulder strength, 5/5 bicep strength, 4/5 tricep, 5/5 wrist and grip strength RUE Sensation: decreased proprioception (hx of decreased proprioception in hands) RUE Coordination: decreased gross motor LUE Deficits / Details: WFL ROM though shoulder ROM approx only at 90 degrees; 4/5 shoulder strength, 5/5 bicep strength, 4/5 tricep, 5/5 wrist and grip strength LUE Sensation: decreased proprioception (hx of proprioception in hands) LUE Coordination: decreased fine motor;decreased gross motor (dysmetria with finger to nose, worse on left. decreased finger to thumb worse on left.)   Lower Extremity Assessment Lower Extremity Assessment: Defer to PT evaluation   Cervical / Trunk Assessment Cervical / Trunk Assessment: Kyphotic   Communication Communication Communication: No difficulties   Cognition Arousal/Alertness: Awake/alert Behavior During Therapy: WFL for tasks assessed/performed Overall Cognitive Status: Within Functional Limits for tasks assessed                                     General Comments       Exercises     Shoulder Instructions      Home Living Family/patient expects to be discharged to:: Private residence Living Arrangements: Spouse/significant other Available Help at Discharge: Available 24 hours/day;Family Type of Home: House Home Access: Stairs to enter [employer] of Steps: 3 Entrance Stairs-Rails: None Home Layout: Able to live on main level with bedroom/bathroom;Two level     Bathroom Shower/Tub: Teacher, early years/pre: Standard Bathroom  Accessibility: Yes   Home Equipment: Wheelchair - manual;Grab bars - toilet;Bedside commode;Grab bars - tub/shower;Walker - 2 wheels;Shower seat          Prior Functioning/Environment Level of Independence: Needs assistance  Gait / Transfers Assistance Needed: uses rollator for ambulation. ADL's / Homemaking Assistance Needed: needs assistane for bathing, dressing and toileting (wiping after BM)   Comments: From prior visit: Pt and spouse report that she has impaired proprioceptive awareness so if she can't see her hands, she is unable to perofm a task, such as peri care, fastening bra, washing back, etc        OT Problem List: Decreased activity tolerance;Impaired balance (sitting and/or standing);Impaired sensation;Decreased knowledge of use of DME or AE      OT Treatment/Interventions: Self-care/ADL training;DME and/or AE instruction;Therapeutic activities;Patient/family education    OT Goals(Current goals can be found in the care plan section) Acute Rehab OT Goals OT Goal Formulation: All assessment and education complete, DC therapy  OT Frequency: Min 2X/week   Barriers to D/C:            Co-evaluation              AM-PAC OT "6 Clicks" Daily Activity     Outcome Measure Help from another person eating meals?: A Little Help from another person taking care of personal grooming?: A Little Help from another person toileting, which includes using toliet, bedpan, or urinal?: A Lot Help from another person bathing (including washing, rinsing, drying)?: A Little Help  from another person to put on and taking off regular upper body clothing?: A Little Help from another person to put on and taking off regular lower body clothing?: A Lot 6 Click Score: 16   End of Session Equipment Utilized During Treatment: Gait belt;Rolling walker Nurse Communication: Mobility status  Activity Tolerance: Patient tolerated treatment well Patient left: in chair;with call bell/phone within  reach;with chair alarm set;with family/visitor present  OT Visit Diagnosis: Unsteadiness on feet (R26.81)                Time: 5834-6219 OT Time Calculation (min): 32 min Charges:  OT General Charges $OT Visit: 1 Visit OT Evaluation $OT Eval Moderate Complexity: 1 Mod  Taralynn Quiett, OTR/L [place]  Office [phone] Pager: [place] 03/16/2021, 12:59 PM

## 2021-03-16 NOTE — Evaluation (Signed)
Physical Therapy Evaluation Patient Details Name: Katelyn Lamb MRN: [id number] DOB: 12-13-1945 Today's Date: 03/16/2021   History of Present Illness  Patient admitted with neurological gait dysfunction. Katelyn Lamb is a 75 y.o. 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  Clinical Impression  Pt is a 75 y.o. female with above listed HPI. Pt required overall MIN A for transfers and ambulation in room. Pt lives with spouse and has assist from family members with mobility at baseline. Pt's granddaughter present towards EOS and confirms that pt has 24/7 supervision and assist with mobility and that current proprioceptive and coordination deficits are baseline for the pt. No further skilled PT needs identified at this time as pt is at baseline mobility and has adequate support for safe mobility at home.      Follow Up Recommendations No PT follow up;Supervision for mobility/OOB    Equipment Recommendations  None recommended by PT    Recommendations for Other Services       Precautions / Restrictions Precautions Precautions: Fall Restrictions Weight Bearing Restrictions: No      Mobility  Bed Mobility Overal bed mobility: Needs Assistance Bed Mobility: Supine to Sit     Supine to sit: Min assist;HOB elevated     General bed mobility comments: min A with HHA to transfer into sitting and scooting to edge of bed.     Transfers Overall transfer level: Needs assistance Equipment used: Rolling walker (2 wheeled) Transfers: Sit to/from [facility] Sit to Stand: Min assist;+2 safety/equipment Stand pivot transfers: Min assist;+2 safety/equipment       General transfer comment: initially MIN A +2 for sit to stand and stand pivot as precautions, due to reported LE weakness pt presented to hospital for.  Ambulation/Gait Ambulation/Gait assistance: Min assist Gait Distance (Feet): 10 Feet Assistive device: Rolling walker (2 wheeled) Gait Pattern/deviations: Step-to pattern;Decreased stride length Gait velocity: decr   General Gait Details: Pt able to progress to MIN A for ambulation from [facility] to recliner with no LOB noted.  Stairs            Wheelchair Mobility    Modified Rankin (Stroke Patients Only)       Balance Overall balance assessment: Needs assistance Sitting-balance support: No upper extremity supported Sitting balance-Leahy Scale: Fair     Standing balance support: Bilateral upper extremity supported;During functional activity Standing balance-Leahy Scale: Poor Standing balance comment: use of external support for balance                             Pertinent Vitals/Pain Pain Assessment: No/denies pain    Home Living Family/patient expects to be discharged to:: Private residence Living Arrangements: Spouse/significant other Available Help at Discharge: Available 24 hours/day;Family Type of Home: House Home Access: Stairs to enter Entrance Stairs-Rails: None Entrance Stairs-Number of Steps: 3 Home Layout: Able to live on main level with bedroom/bathroom;Two level Home Equipment: Wheelchair - manual;Grab bars - toilet;Bedside commode;Grab bars - tub/shower;Walker - 2 wheels;Shower seat Additional  Comments: Per pt's granddaughter, someome is at home 24/7 to provide assist for pt at baseline    Prior Function Level of Independence: Needs  assistance   Gait / Transfers Assistance Needed: uses rollator for ambulation.  ADL's / Homemaking Assistance Needed: needs assistane for bathing, dressing and toileting (wiping after BM)  Comments: From prior visit: Pt and spouse report that she has impaired proprioceptive awareness so if she can't see her hands, she is unable to perofm a task, such as peri care, fastening bra, washing back, etc     Hand Dominance   Dominant Hand: Right    Extremity/Trunk Assessment   Upper Extremity Assessment RUE Sensation: decreased proprioception (hx of decr proprioception) LUE Sensation: decreased proprioception LUE Coordination: decreased fine motor;decreased gross motor (dysmetria and difficulty with finger opposition, worse on L for both tasks)    Lower Extremity Assessment Lower Extremity Assessment: RLE deficits/detail;LLE deficits/detail RLE Deficits / Details: grossly 4/5 strength, assessed while seated. Pt able to draw shapes accurately on floor with eyes closed when prompted RLE Sensation: WNL LLE Deficits / Details: grossly 4/5 strength, assessed while seated, Pt able to draw shapes accurately on floor with eyes closed when prompted LLE Sensation: WNL    Cervical / Trunk Assessment Cervical / Trunk Assessment: Kyphotic  Communication   Communication: No difficulties  Cognition Arousal/Alertness: Awake/alert Behavior During Therapy: WFL for tasks assessed/performed Overall Cognitive Status: Within Functional Limits for tasks assessed                                        General Comments      Exercises     Assessment/Plan    PT Assessment Patent does not need any further PT services  PT Problem List         PT Treatment Interventions      PT Goals (Current goals can be found in the Care Plan section)  Acute Rehab PT Goals Patient Stated Goal: none stated PT Goal Formulation: With patient Time For Goal Achievement: 03/30/21 Potential to Achieve  Goals: Good    Frequency     Barriers to discharge        Co-evaluation               AM-PAC PT "6 Clicks" Mobility  Outcome Measure Help needed turning from your back to your side while in a flat bed without using bedrails?: A Little Help needed moving from lying on your back to sitting on the side of a flat bed without using bedrails?: A Little Help needed moving to and from a bed to a chair (including a wheelchair)?: A Little Help needed standing up from a chair using your arms (e.g., wheelchair or bedside chair)?: A Little Help needed to walk in hospital room?: A Little Help needed climbing 3-5 steps with a railing? : A Lot 6 Click Score: 17    End of Session Equipment Utilized During Treatment: Gait belt Activity Tolerance: Patient tolerated treatment well Patient left: in chair;with call bell/phone within reach;with chair alarm set;with family/visitor present Nurse Communication: Mobility status PT Visit Diagnosis: Unsteadiness on feet (R26.81);Muscle weakness (generalized) (M62.81)    Time: 2751-7001 PT Time Calculation (min) (ACUTE ONLY): 32 min   Charges:   PT Evaluation $PT Eval Moderate Complexity: 1 Mod         Festus Barren PT, DPT  Acute Rehabilitation Services  Office [phone]  03/16/2021, 4:12 PM

## 2021-03-16 NOTE — Care Plan (Signed)
  MR THORACIC SPINE W WO CONTRAST 03/15/2021  IMPRESSION: 1. Subtle T2 signal abnormality within the central/dorsal aspect of the upper and mid thoracic spinal cord with associated subtle cord atrophy as above. Findings felt to be most consistent with chronic myelomalacia, presumably related to history of prior transverse myelitis. No cord edema or enhancement to suggest acute or recurrent disease. 2. Underlying mild multilevel thoracic spondylosis without significant stenosis, relatively stable from previous.   Electronically Signed By: Jeannine Boga M.D. On: 03/16/2021 02:45   MR CERVICAL SPINE W WO CONTRAST 03/15/2021  IMPRESSION: 1. Chronic myelomalacia involving the dorsal cervical cord, most pronounced at C3-4, stable from prior. Finding presumably related to history of prior transverse myelitis. No superimposed edema or enhancement to suggest acute or recurrent disease. 2. Multilevel cervical spondylosis with resultant mild to moderate spinal stenosis at C4-5 through C6-7. 3. Multifactorial degenerative changes with resultant moderate to severe bilateral foraminal narrowing at C4 through C7 as above.  Images above personally reviewed, Dr. Karolee Stamps note reviewed as well, and patient's chart briefly reviewed.  Agree with radiology read, no clear contrast-enhancing lesions suggestive of acute process.  Through the cervical spine postcontrast images are somewhat motion limited, the precontrast images do not demonstrate any new lesions which is reassuring.  Suspect multifactorial gait disorder in the setting of her myelitis, lumbar radiculopathy, age, and degenerative disc disease, likely exacerbated by acute infection given leukocytosis on arrival.  No further inpatient neurological work-up is indicated at this time, appreciate primary team's continue treatment of infection and PT/OT evaluation  Neurology will be available on an as-needed basis going forward.  Please reach  out if any questions or concerns arise, especially if the patient fails to improved with treatment of her infection and certainly if she continues to worsen.  Lesleigh Noe MD-PhD [facility] [phone] Available 7 AM to 7 PM, outside these hours please contact Neurologist on call listed on AMION

## 2021-03-17 MED ORDER — CEFDINIR 300 MG PO CAPS: 300.0000 mg | ORAL_CAPSULE | Freq: Two times a day (BID) | ORAL | 0 refills | Status: AC

## 2021-03-17 NOTE — Evaluation (Signed)
Physical Therapy Re-Evaluation Patient Details Name: Katelyn Lamb MRN: [id number] DOB: 1945-12-11 Today's Date: 03/17/2021   History of Present Illness  Patient is a 75 year old female who presented to the emergency department on 03/15/2021 with recurrent falls and generalized weakness especially of her lower extremities and admitted with neurological gait dysfunction. Past medical history significant for lumbar spinal stenosis with chronic back pain,  encephalomyelitis due to HSV (started December 2018) and incomplete paraplegia (uses walker at baseline), PE and DVT (approx 2019 for DVT and 2021 for PE now treated with Xarelto), thrombocytopenia, neurogenic bladder with suprapubic catheter, anemia, hyponatremia, memory difficulty, chronic intermittent diarrhea, recent diagnosis of vertigo  Clinical Impression  Pt admitted with above diagnosis. Pt currently with functional limitations due to the deficits listed below (see PT Problem List). Pt will benefit from skilled PT to increase their independence and safety with mobility to allow discharge to the venue listed below.  Spouse reports assisted fall yesterday with staff and concerned about d/c home today.  Pt assisted with ambulating short distance in hallway and able to tolerate her typical distance at home.  Spouse appears more at ease with d/c home today based on pt's mobility today.  Spouse also reports pt has assist and gait belts at home.  Pt has been receiving HHPT and would benefit from continuing HHPT upon d/c.  Will continue to assist with safe mobility in hospital in preparation for return home, if pt does not d/c home today.     Follow Up Recommendations Home health PT;Supervision for mobility/OOB    Equipment Recommendations  None recommended by PT    Recommendations for Other Services       Precautions / Restrictions Precautions Precautions: Fall      Mobility  Bed Mobility Overal bed mobility: Needs Assistance Bed Mobility:  Supine to Sit     Supine to sit: Mod assist     General bed mobility comments: assist for LEs over EOB (crossed legs in transition) and assisted with scooting to EOB using bed pad    Transfers Overall transfer level: Needs assistance Equipment used: Rolling walker (2 wheeled) Transfers: Sit to/from Stand Sit to Stand: Mod assist         General transfer comment: min assist to rise and steady; mod assist to control descent as pt did not control descent to recliner  Ambulation/Gait Ambulation/Gait assistance: Min assist Gait Distance (Feet): 30 Feet Assistive device: Rolling walker (2 wheeled) Gait Pattern/deviations: Step-to pattern;Decreased stride length;Narrow base of support Gait velocity: decr   General Gait Details: assist for stability, therapist held left hand in place for safety, typically only 15ish feet at home at a time per spouse so limited distance for safety and fatigue  Stairs            Wheelchair Mobility    Modified Rankin (Stroke Patients Only)       Balance Overall balance assessment: Needs assistance         Standing balance support: Bilateral upper extremity supported;During functional activity Standing balance-Leahy Scale: Poor Standing balance comment: use of external support for balance                             Pertinent Vitals/Pain Pain Assessment: No/denies pain    Home Living Family/patient expects to be discharged to:: Private residence Living Arrangements: Spouse/significant other Available Help at Discharge: Available 24 hours/day;Family Type of Home: House Home Access: Stairs to enter  Entrance Stairs-Rails: None Entrance Stairs-Number of Steps: 3 Home Layout: Able to live on main level with bedroom/bathroom;Two level Home Equipment: Wheelchair - manual;Grab bars - toilet;Bedside commode;Grab bars - tub/shower;Walker - 2 wheels;Shower seat Additional Comments: Per pt's spouse, someome is at home 24/7 to  provide assist for pt at baseline    Prior Function Level of Independence: Needs assistance   Gait / Transfers Assistance Needed: uses rollator for ambulation.  ADL's / Homemaking Assistance Needed: needs assistane for bathing, dressing and toileting (wiping after BM)  Comments: From prior visit: Pt and spouse report that she has impaired proprioceptive awareness so if she can't see her hands, she is unable to perofm a task, such as peri care, fastening bra, washing back, etc     Hand Dominance   Dominant Hand: Right    Extremity/Trunk Assessment   Upper Extremity Assessment LUE Deficits / Details: decreased left hand grip observed, required assist to hold RW and spouse reports HHPT ordered attachment for walker to assist with keeping left hand in place LUE Sensation: decreased proprioception    Lower Extremity Assessment Lower Extremity Assessment: Generalized weakness       Communication   Communication: No difficulties  Cognition Arousal/Alertness: Awake/alert Behavior During Therapy: WFL for tasks assessed/performed Overall Cognitive Status: Within Functional Limits for tasks assessed                                        General Comments      Exercises     Assessment/Plan    PT Assessment Patient needs continued PT services  PT Problem List Decreased strength;Decreased mobility;Decreased activity tolerance;Decreased balance;Decreased knowledge of use of DME;Decreased coordination       PT Treatment Interventions Gait training;DME instruction;Therapeutic exercise;Balance training;Functional mobility training;Patient/family education;Therapeutic activities;Neuromuscular re-education    PT Goals (Current goals can be found in the Care Plan section)  Acute Rehab PT Goals PT Goal Formulation: With patient Time For Goal Achievement: 03/31/21 Potential to Achieve Goals: Good    Frequency Min 3X/week   Barriers to discharge         Co-evaluation               AM-PAC PT "6 Clicks" Mobility  Outcome Measure Help needed turning from your back to your side while in a flat bed without using bedrails?: A Little Help needed moving from lying on your back to sitting on the side of a flat bed without using bedrails?: A Lot Help needed moving to and from a bed to a chair (including a wheelchair)?: A Lot Help needed standing up from a chair using your arms (e.g., wheelchair or bedside chair)?: A Lot Help needed to walk in hospital room?: A Little Help needed climbing 3-5 steps with a railing? : A Lot 6 Click Score: 14    End of Session Equipment Utilized During Treatment: Gait belt Activity Tolerance: Patient tolerated treatment well Patient left: in chair;with call bell/phone within reach;with chair alarm set;with family/visitor present Nurse Communication: Mobility status PT Visit Diagnosis: Unsteadiness on feet (R26.81);Muscle weakness (generalized) (M62.81)    Time: 1601-0932 PT Time Calculation (min) (ACUTE ONLY): 21 min   Charges:   PT Evaluation $PT Re-evaluation: 1 Re-eval     Kati PT, DPT Acute Rehabilitation Services Pager: [phone] Office: [phone]   York Ram E 03/17/2021, 3:52 PM

## 2021-03-17 NOTE — Discharge Summary (Signed)
Physician Discharge Summary  Katelyn Lamb ZJI:[id number] DOB: [birth year] DOA: 03/14/2021  PCP: Katelyn Amel, MD  Admit date: 03/14/2021 Discharge date: 03/17/2021 30 Day Unplanned Readmission Risk Score    Flowsheet Row ED to Hosp-Admission (Current) from 03/14/2021 in [facility]  30 Day Unplanned Readmission Risk Score (%) 21.06 Filed at 03/17/2021 0801       This score is the patient's risk of an unplanned readmission within 30 days of being discharged (0 -100%). The score is based on dignosis, age, lab data, medications, orders, and past utilization.   Low:  0-14.9   Medium: 15-21.9   High: 22-29.9   Extreme: 30 and above          Admitted From: home Disposition:  home  Recommendations for Outpatient Follow-up:  Follow up with PCP in 1-2 weeks Please obtain BMP/CBC in one week Please follow up with your PCP on the following pending results: Unresulted Labs (From admission, onward)     Start     Ordered   03/14/21 2358  Levetiracetam level  Once,   STAT        03/14/21 [place]: Yes Equipment/Devices:none   Discharge Condition:stable  CODE STATUS:full code  Diet recommendation: cardiac  Subjective: Seen and examined.  She has no complaints.  Brief/Interim Summary: Katelyn Lamb is a 75 y.o. right-handed female with Past medical history of HSV encephalitis, incomplete paraplegia, PE and DVT, thrombocytopenia, neurogenic bladder SP suprapubic catheter, anemia, hyponatremia, IBS.  Presented with complaints of recurrent fall and generalized weakness. Suspected to have UTI, started on antibiotics.  Neurology consulted.  Work-up so far unremarkable including CT head, CT cervical spine, MRI brain and MRI cervical as well as thoracic spine were all unremarkable.  Neurology signed off opined that her weakness was likely secondary to UTI.  She was assessed by PT OT and was thought to be back to her baseline and they recommended discharging  home with home health.  Patient being discharged home in stable condition and she will continue 5 more days of oral antibiotics to complete course for UTI  Discharge Diagnoses:  Principal Problem:   Neurologic gait dysfunction Active Problems:   Thrombocytopenia ([place])   Neuropathic pain   Incomplete paraplegia (HCC)   Neurogenic bladder   Urinary retention   DVT, lower extremity, distal, chronic (HCC)   Generalized weakness   Acute cystitis with hematuria   Encephalopathy   Recurrent falls    Discharge Instructions   Allergies as of 03/17/2021       Reactions   Demerol [meperidine] Other (See Comments)   Hallucinations   Percocet [oxycodone-acetaminophen] Itching   Amoxicillin-pot Clavulanate Diarrhea   Severe pain, headache, intestinal infection   Penicillins Itching, Rash   Has patient had a PCN reaction causing immediate rash, facial/tongue/throat swelling, SOB or lightheadedness with hypotension:  NO Has patient had a PCN reaction causing severe rash involving mucus membranes or skin necrosis: No Has patient had a PCN reaction that required hospitalization: No Has patient had a PCN reaction occurring within the last 10 years: Yes If all of the above answers are "NO", then may proceed with Cephalosporin use.        Medication List     STOP taking these medications    colestipol 1 g tablet Commonly known as: COLESTID   diphenoxylate-atropine 2.5-0.025 MG tablet Commonly known as: Lomotil  TAKE these medications    acyclovir 400 MG tablet Commonly known as: ZOVIRAX TAKE 1 TABLET BY MOUTH TWICE A DAY   cefdinir 300 MG capsule Commonly known as: OMNICEF Take 1 capsule (300 mg total) by mouth every 12 (twelve) hours for 5 days.   diazepam 5 MG tablet Commonly known as: VALIUM TAKE 1 TABLET (5 MG TOTAL) BY MOUTH IN THE MORNING AND AT BEDTIME.   diclofenac Sodium 1 % Gel Commonly known as: VOLTAREN Apply 1 application topically daily as needed  (pain).   DULoxetine 30 MG capsule Commonly known as: CYMBALTA TAKE 1 CAPSULE BY MOUTH EVERY DAY IN THE MORNING What changed: See the new instructions.   DULoxetine 60 MG capsule Commonly known as: CYMBALTA TAKE 1 CAPSULE BY MOUTH EVERYDAY AT BEDTIME What changed: See the new instructions.   ferrous sulfate 325 (65 FE) MG tablet Take 325 mg by mouth daily.   Hair/Skin/Nails/Biotin Tabs Take 1 tablet by mouth daily.   HYDROcodone-acetaminophen 10-325 MG tablet Commonly known as: NORCO Take 1 tablet by mouth every 6 (six) hours as needed for pain.   levETIRAcetam 250 MG tablet Commonly known as: Keppra Take 1 tablet (250 mg total) by mouth 2 (two) times daily.   loperamide 2 MG capsule Commonly known as: IMODIUM Take 2 mg by mouth as needed for diarrhea or loose stools.   mirabegron ER 50 MG Tb24 tablet Commonly known as: MYRBETRIQ Take 50 mg by mouth daily.   omega-3 acid ethyl esters 1 g capsule Commonly known as: LOVAZA Take 1 g by mouth daily.   pantoprazole 40 MG tablet Commonly known as: PROTONIX Take 1 tablet (40 mg total) by mouth daily. What changed: when to take this   polyethylene glycol 17 g packet Commonly known as: MIRALAX / GLYCOLAX Take 17 g by mouth daily. What changed:  when to take this reasons to take this   predniSONE 5 MG tablet Commonly known as: DELTASONE Take 1 tablet (5 mg total) by mouth daily with breakfast.   pregabalin 50 MG capsule Commonly known as: LYRICA Take 2 capsules (100 mg total) by mouth at bedtime.   QUEtiapine 100 MG tablet Commonly known as: SEROQUEL Take 100 mg by mouth at bedtime. What changed: Another medication with the same name was removed. Continue taking this medication, and follow the directions you see here.   rivaroxaban 20 MG Tabs tablet Commonly known as: XARELTO Take 1 tablet (20 mg total) by mouth daily with supper. What changed: when to take this        Follow-up Information     Koirala,  Dibas, MD Follow up in 1 week(s).   Specialty: Family Medicine Contact information: [address] [place] [address] [phone]                Allergies  Allergen Reactions   Demerol [Meperidine] Other (See Comments)    Hallucinations   Percocet [Oxycodone-Acetaminophen] Itching   Amoxicillin-Pot Clavulanate Diarrhea    Severe pain, headache, intestinal infection   Penicillins Itching and Rash    Has patient had a PCN reaction causing immediate rash, facial/tongue/throat swelling, SOB or lightheadedness with hypotension:  NO Has patient had a PCN reaction causing severe rash involving mucus membranes or skin necrosis: No Has patient had a PCN reaction that required hospitalization: No Has patient had a PCN reaction occurring within the last 10 years: Yes If all of the above answers are "NO", then may proceed with Cephalosporin use.  Consultations: Neurology   Procedures/Studies: CT Head Wo Contrast  Result Date: 03/15/2021 CLINICAL DATA:  Multiple recent falls EXAM: CT HEAD WITHOUT CONTRAST CT CERVICAL SPINE WITHOUT CONTRAST TECHNIQUE: Multidetector CT imaging of the head and cervical spine was performed following the standard protocol without intravenous contrast. Multiplanar CT image reconstructions of the cervical spine were also generated. COMPARISON:  12/06/2019 FINDINGS: CT HEAD FINDINGS Brain: No evidence of acute infarction, hemorrhage, hydrocephalus, extra-axial collection or mass lesion/mass effect. Chronic atrophic and ischemic changes are noted similar to that seen on the prior exam. Vascular: No hyperdense vessel or unexpected calcification. Skull: Normal. Negative for fracture or focal lesion. Sinuses/Orbits: No acute finding. Other: None. CT CERVICAL SPINE FINDINGS Alignment: Mild loss of the normal cervical lordosis is noted likely related to muscular spasm. Skull base and vertebrae: 7 cervical segments are well visualized. Vertebral body  height is well maintained. Multilevel osteophytic changes are seen. Facet hypertrophic changes are noted throughout the cervical spine. No findings to suggest acute fracture or acute facet abnormality are noted. The odontoid is within normal limits. Soft tissues and spinal canal: Surrounding soft tissue structures are within normal limits. Upper chest: Visualized lung apices are within normal limits. Other: None IMPRESSION: CT of the head: Chronic atrophic and ischemic changes stable from the prior study. CT of the cervical spine: Multilevel degenerative change without acute abnormality. Electronically Signed   By: Inez Catalina M.D.   On: 03/15/2021 01:51   CT Cervical Spine Wo Contrast  Result Date: 03/15/2021 CLINICAL DATA:  Multiple recent falls EXAM: CT HEAD WITHOUT CONTRAST CT CERVICAL SPINE WITHOUT CONTRAST TECHNIQUE: Multidetector CT imaging of the head and cervical spine was performed following the standard protocol without intravenous contrast. Multiplanar CT image reconstructions of the cervical spine were also generated. COMPARISON:  12/06/2019 FINDINGS: CT HEAD FINDINGS Brain: No evidence of acute infarction, hemorrhage, hydrocephalus, extra-axial collection or mass lesion/mass effect. Chronic atrophic and ischemic changes are noted similar to that seen on the prior exam. Vascular: No hyperdense vessel or unexpected calcification. Skull: Normal. Negative for fracture or focal lesion. Sinuses/Orbits: No acute finding. Other: None. CT CERVICAL SPINE FINDINGS Alignment: Mild loss of the normal cervical lordosis is noted likely related to muscular spasm. Skull base and vertebrae: 7 cervical segments are well visualized. Vertebral body height is well maintained. Multilevel osteophytic changes are seen. Facet hypertrophic changes are noted throughout the cervical spine. No findings to suggest acute fracture or acute facet abnormality are noted. The odontoid is within normal limits. Soft tissues and spinal  canal: Surrounding soft tissue structures are within normal limits. Upper chest: Visualized lung apices are within normal limits. Other: None IMPRESSION: CT of the head: Chronic atrophic and ischemic changes stable from the prior study. CT of the cervical spine: Multilevel degenerative change without acute abnormality. Electronically Signed   By: Inez Catalina M.D.   On: 03/15/2021 01:51   MR BRAIN WO CONTRAST  Result Date: 03/15/2021 CLINICAL DATA:  Dizziness EXAM: MRI HEAD WITHOUT CONTRAST TECHNIQUE: Multiplanar, multiecho pulse sequences of the brain and surrounding structures were obtained without intravenous contrast. COMPARISON:  02/02/2020 FINDINGS: Brain: There is no acute infarction or intracranial hemorrhage. There is no intracranial mass, mass effect, or edema. There is no hydrocephalus or extra-axial fluid collection. Prominence of the ventricles sulci reflects generalized parenchymal volume loss. Patchy and confluent areas of T2 hyperintensity in the supratentorial and pontine white matter are nonspecific but reflect moderate chronic microvascular ischemic changes. Appearance is similar to the prior study. Vascular:  Major vessel flow voids at the skull base are preserved. Skull and upper cervical spine: Normal marrow signal is preserved. Sinuses/Orbits: Paranasal sinuses are aerated. Bilateral lens replacements. Other: Sella is unremarkable. Chronic patchy right mastoid opacification. IMPRESSION: No evidence of recent infarction, hemorrhage, or mass. Moderate chronic microvascular ischemic changes. No substantial change from the prior study. Electronically Signed   By: Macy Mis M.D.   On: 03/15/2021 08:05   MR CERVICAL SPINE W WO CONTRAST  Result Date: 03/16/2021 CLINICAL DATA:  Initial evaluation for acute on chronic ataxia. EXAM: MRI CERVICAL SPINE WITHOUT AND WITH CONTRAST TECHNIQUE: Multiplanar and multiecho pulse sequences of the cervical spine, to include the craniocervical junction  and cervicothoracic junction, were obtained without and with intravenous contrast. CONTRAST:  53mL GADAVIST GADOBUTROL 1 MMOL/ML IV SOLN COMPARISON:  MRI from 02/02/2020. FINDINGS: Alignment: Reversal of the normal cervical lordosis.  No listhesis. Vertebrae: Vertebral body height maintained without acute or chronic fracture. Bone marrow signal intensity within normal limits. Benign hemangioma noted within the T2 vertebral body. No other discrete or worrisome osseous lesions. No abnormal marrow edema or enhancement. Cord: T2 signal abnormality with atrophy noted involving the central/dorsal aspect of the cord at the level of C3-4, stable from prior. Additional mild patchy signal abnormality extends inferiorly within the dorsal cord towards the upper thoracic spine as well. Findings most consistent with chronic myelomalacia, most likely related to prior history of transverse myelitis. No superimposed cord edema or enhancement to suggest an acute or recurrent component. Posterior Fossa, vertebral arteries, paraspinal tissues: Patchy signal abnormality within the visualized pons/brainstem most like related chronic microvascular ischemic disease. Visualized brain and posterior fossa otherwise unremarkable. Craniocervical junction normal. Paraspinous and prevertebral soft tissues within normal limits. Normal flow voids seen within the vertebral arteries bilaterally. Disc levels: C2-C3: Tiny central disc protrusion mildly indents the ventral thecal sac. Mild right greater than left facet hypertrophy. No canal or foraminal stenosis. C3-C4: Degenerative intervertebral disc space narrowing with diffuse disc osteophyte complex. Flattening of the ventral thecal sac without significant spinal stenosis. Mild bilateral C4 foraminal narrowing. C4-C5: Degenerative intervertebral disc space narrowing with diffuse disc osteophyte complex. Flattening and partial effacement of the ventral thecal sac with resultant mild spinal stenosis.  Moderate left worse than right C5 foraminal stenosis. C5-C6: Degenerative intervertebral disc space narrowing with diffuse disc bulge and uncovertebral spurring. Superimposed left paracentral disc osteophyte complex indents the left ventral thecal sac, contacting and mildly flattening the left ventral cord (series 26, image 26). Associated mild cord flattening with moderate spinal stenosis. Severe bilateral C6 foraminal narrowing. C6-C7: Degenerative intervertebral disc space narrowing with diffuse disc osteophyte complex. Flattening and partial effacement of the ventral thecal sac with resultant mild spinal stenosis. Moderate bilateral C7 foraminal narrowing. C7-T1: Mild uncovertebral spurring without significant disc bulge. Mild facet hypertrophy. No spinal stenosis. Foramina remain patent. IMPRESSION: 1. Chronic myelomalacia involving the dorsal cervical cord, most pronounced at C3-4, stable from prior. Finding presumably related to history of prior transverse myelitis. No superimposed edema or enhancement to suggest acute or recurrent disease. 2. Multilevel cervical spondylosis with resultant mild to moderate spinal stenosis at C4-5 through C6-7. 3. Multifactorial degenerative changes with resultant moderate to severe bilateral foraminal narrowing at C4 through C7 as above. Electronically Signed   By: Jeannine Boga M.D.   On: 03/16/2021 02:29   MR THORACIC SPINE W WO CONTRAST  Result Date: 03/16/2021 CLINICAL DATA:  Initial evaluation for acute on chronic ataxia. EXAM: MRI THORACIC WITHOUT AND WITH CONTRAST  TECHNIQUE: Multiplanar and multiecho pulse sequences of the thoracic spine were obtained without and with intravenous contrast. CONTRAST:  37mL GADAVIST GADOBUTROL 1 MMOL/ML IV SOLN COMPARISON:  Prior MRI from 02/04/2020. FINDINGS: Alignment: Trace retrolisthesis of T12 on L1. Alignment otherwise normal with preservation of the normal thoracic kyphosis. Vertebrae: Minimal chronic height loss noted  at the superior endplate of O75. Vertebral body height otherwise maintained without acute or subacute fracture. Bone marrow signal intensity heterogeneous but overall within normal limits. Multiple scattered benign hemangiomata noted, most prominent of which are in the T2 and L1 vertebral bodies. No worrisome osseous lesions. No abnormal marrow edema or enhancement. Cord: Subtle T2 signal abnormality within the central/dorsal aspect of the upper and mid thoracic spinal cord, most pronounced at T2 (series 15, image 6), T4-5 (series 15, image 13), and T8 (series 15, image 25). Associated subtle cord atrophy at these levels. Findings felt to be most consistent with chronic myelomalacia, presumably related to history of prior transverse myelitis. No cord edema or enhancement to suggest acute or recurrent disease. Paraspinal and other soft tissues: Unremarkable. Disc levels: Few scattered small central to right paracentral disc protrusions again noted within the thoracic spine, overall little interval change from prior. No significant spinal stenosis or evidence for cord impingement. Foramina remain patent. IMPRESSION: 1. Subtle T2 signal abnormality within the central/dorsal aspect of the upper and mid thoracic spinal cord with associated subtle cord atrophy as above. Findings felt to be most consistent with chronic myelomalacia, presumably related to history of prior transverse myelitis. No cord edema or enhancement to suggest acute or recurrent disease. 2. Underlying mild multilevel thoracic spondylosis without significant stenosis, relatively stable from previous. Electronically Signed   By: Jeannine Boga M.D.   On: 03/16/2021 02:45   DG Pelvis Portable  Result Date: 03/15/2021 CLINICAL DATA:  Multiple recent falls with pelvic pain, initial encounter EXAM: PORTABLE PELVIS 1-2 VIEWS COMPARISON:  12/19/2017 FINDINGS: Changes of prior vertebral augmentation are noted at L5. Pelvic ring appears intact. No  acute fracture or dislocation is noted. No soft tissue abnormality is seen. IMPRESSION: No acute abnormality noted. Electronically Signed   By: Inez Catalina M.D.   On: 03/15/2021 01:42   DG Chest Portable 1 View  Result Date: 03/15/2021 CLINICAL DATA:  Multiple recent falls with chest pain, initial encounter EXAM: PORTABLE CHEST 1 VIEW COMPARISON:  02/13/2020 FINDINGS: The heart size and mediastinal contours are within normal limits. Both lungs are clear. The visualized skeletal structures are unremarkable. IMPRESSION: No active disease. Electronically Signed   By: Inez Catalina M.D.   On: 03/15/2021 01:42     Discharge Exam: Vitals:   03/16/21 2041 03/17/21 0604  BP: 99/76 100/71  Pulse: 99 97  Resp: 18 18  Temp: 98.7 F (37.1 C) 97.9 F (36.6 C)  SpO2: 97% 94%   Vitals:   03/16/21 1853 03/16/21 1855 03/16/21 2041 03/17/21 0604  BP: 116/86 127/78 99/76 100/71  Pulse: 97 89 99 97  Resp:   18 18  Temp:   98.7 F (37.1 C) 97.9 F (36.6 C)  TempSrc:   Oral Oral  SpO2:   97% 94%  Weight:    70.1 kg  Height:        General: Pt is alert, awake, not in acute distress Cardiovascular: RRR, S1/S2 +, no rubs, no gallops Respiratory: CTA bilaterally, no wheezing, no rhonchi Abdominal: Soft, NT, ND, bowel sounds + Extremities: no edema, no cyanosis    The results of significant diagnostics  from this hospitalization (including imaging, microbiology, ancillary and laboratory) are listed below for reference.     Microbiology: Recent Results (from the past 240 hour(s))  Urine culture     Status: Abnormal   Collection Time: 03/15/21  2:41 AM   Specimen: Urine, Random  Result Value Ref Range Status   Specimen Description   Final    URINE, RANDOM Performed at [facility] [address], [place], [place] [address]    Special Requests   Final    NONE Performed at [facility], [place] [address], [place], [place] [address]    Culture MULTIPLE  SPECIES PRESENT, SUGGEST RECOLLECTION (A)  Final   Report Status 03/16/2021 FINAL  Final  Resp Panel by RT-PCR (Flu A&B, Covid) Nasopharyngeal Swab     Status: None   Collection Time: 03/15/21  4:46 AM   Specimen: Nasopharyngeal Swab; Nasopharyngeal(NP) swabs in vial transport medium  Result Value Ref Range Status   SARS Coronavirus 2 by RT PCR NEGATIVE NEGATIVE Final    Comment: (NOTE) SARS-CoV-2 target nucleic acids are NOT DETECTED.  The SARS-CoV-2 RNA is generally detectable in upper respiratory specimens during the acute phase of infection. The lowest concentration of SARS-CoV-2 viral copies this assay can detect is 138 copies/mL. A negative result does not preclude SARS-Cov-2 infection and should not be used as the sole basis for treatment or other patient management decisions. A negative result may occur with  improper specimen collection/handling, submission of specimen other than nasopharyngeal swab, presence of viral mutation(s) within the areas targeted by this assay, and inadequate number of viral copies(<138 copies/mL). A negative result must be combined with clinical observations, patient history, and epidemiological information. The expected result is Negative.  Fact Sheet for Patients:  EntrepreneurPulse.com.au  Fact Sheet for Healthcare Providers:  IncredibleEmployment.be  This test is no t yet approved or cleared by the [place] FDA and  has been authorized for detection and/or diagnosis of SARS-CoV-2 by FDA under an Emergency Use Authorization (EUA). This EUA will remain  in effect (meaning this test can be used) for the duration of the COVID-19 declaration under Section 564(b)(1) of the Act, 21 U.S.C.section 360bbb-3(b)(1), unless the authorization is terminated  or revoked sooner.       Influenza A by PCR NEGATIVE NEGATIVE Final   Influenza B by PCR NEGATIVE NEGATIVE Final    Comment: (NOTE) The Xpert Xpress  SARS-CoV-2/FLU/RSV plus assay is intended as an aid in the diagnosis of influenza from Nasopharyngeal swab specimens and should not be used as a sole basis for treatment. Nasal washings and aspirates are unacceptable for Xpert Xpress SARS-CoV-2/FLU/RSV testing.  Fact Sheet for Patients: EntrepreneurPulse.com.au  Fact Sheet for Healthcare Providers: IncredibleEmployment.be  This test is not yet approved or cleared by the [place] FDA and has been authorized for detection and/or diagnosis of SARS-CoV-2 by FDA under an Emergency Use Authorization (EUA). This EUA will remain in effect (meaning this test can be used) for the duration of the COVID-19 declaration under Section 564(b)(1) of the Act, 21 U.S.C. section 360bbb-3(b)(1), unless the authorization is terminated or revoked.  Performed at [facility], [place] [address], [place], [place] [address]      Labs: BNP (last 3 results) No results for input(s): BNP in the last 8760 hours. Basic Metabolic Panel: Recent Labs  Lab 03/15/21 0125 03/16/21 0511  NA 139 141  K 4.1 4.0  CL 102 108  CO2 30 27  GLUCOSE 103*  98  BUN 14 10  CREATININE 0.81 0.77  CALCIUM 8.9 8.4*  MG 1.8  --    Liver Function Tests: Recent Labs  Lab 03/15/21 0125  AST 26  ALT 17  ALKPHOS 82  BILITOT 0.4  PROT 6.9  ALBUMIN 3.5   No results for input(s): LIPASE, AMYLASE in the last 168 hours. Recent Labs  Lab 03/15/21 0443  AMMONIA 22   CBC: Recent Labs  Lab 03/15/21 0125 03/16/21 0511  WBC 11.1* 9.0  NEUTROABS 6.7  --   HGB 8.6* 8.5*  HCT 29.9* 30.5*  MCV 82.4 83.6  PLT 249 216   Cardiac Enzymes: No results for input(s): CKTOTAL, CKMB, CKMBINDEX, TROPONINI in the last 168 hours. BNP: Invalid input(s): POCBNP CBG: No results for input(s): GLUCAP in the last 168 hours. D-Dimer No results for input(s): DDIMER in the last 72 hours. Hgb A1c Recent Labs    03/15/21 0443   HGBA1C 5.9*   Lipid Profile Recent Labs    03/15/21 0443  CHOL 172  HDL 67  LDLCALC 84  TRIG 107  CHOLHDL 2.6   Thyroid function studies Recent Labs    03/15/21 0125  TSH 1.817   Anemia work up Recent Labs    03/15/21 0443  VITAMINB12 2,030*  FOLATE 13.7  FERRITIN 5*  TIBC 469*  IRON 28  RETICCTPCT 2.8   Urinalysis    Component Value Date/Time   COLORURINE YELLOW 03/15/2021 0241   APPEARANCEUR CLOUDY (A) 03/15/2021 0241   LABSPEC 1.013 03/15/2021 0241   PHURINE 5.0 03/15/2021 0241   GLUCOSEU NEGATIVE 03/15/2021 0241   HGBUR NEGATIVE 03/15/2021 0241   BILIRUBINUR NEGATIVE 03/15/2021 0241   KETONESUR NEGATIVE 03/15/2021 0241   PROTEINUR NEGATIVE 03/15/2021 0241   NITRITE POSITIVE (A) 03/15/2021 0241   LEUKOCYTESUR LARGE (A) 03/15/2021 0241   Sepsis Labs Invalid input(s): PROCALCITONIN,  WBC,  LACTICIDVEN Microbiology Recent Results (from the past 240 hour(s))  Urine culture     Status: Abnormal   Collection Time: 03/15/21  2:41 AM   Specimen: Urine, Random  Result Value Ref Range Status   Specimen Description   Final    URINE, RANDOM Performed at [facility], [place] [address], [place], [place] [address]    Special Requests   Final    NONE Performed at [facility], [place] [address] [place], [place] [address]    Culture MULTIPLE SPECIES PRESENT, SUGGEST RECOLLECTION (A)  Final   Report Status 03/16/2021 FINAL  Final  Resp Panel by RT-PCR (Flu A&B, Covid) Nasopharyngeal Swab     Status: None   Collection Time: 03/15/21  4:46 AM   Specimen: Nasopharyngeal Swab; Nasopharyngeal(NP) swabs in vial transport medium  Result Value Ref Range Status   SARS Coronavirus 2 by RT PCR NEGATIVE NEGATIVE Final    Comment: (NOTE) SARS-CoV-2 target nucleic acids are NOT DETECTED.  The SARS-CoV-2 RNA is generally detectable in upper respiratory specimens during the acute phase of infection. The lowest concentration of SARS-CoV-2  viral copies this assay can detect is 138 copies/mL. A negative result does not preclude SARS-Cov-2 infection and should not be used as the sole basis for treatment or other patient management decisions. A negative result may occur with  improper specimen collection/handling, submission of specimen other than nasopharyngeal swab, presence of viral mutation(s) within the areas targeted by this assay, and inadequate number of viral copies(<138 copies/mL). A negative result must be combined with clinical observations, patient history, and epidemiological information. The expected  result is Negative.  Fact Sheet for Patients:  EntrepreneurPulse.com.au  Fact Sheet for Healthcare Providers:  IncredibleEmployment.be  This test is no t yet approved or cleared by the [place] FDA and  has been authorized for detection and/or diagnosis of SARS-CoV-2 by FDA under an Emergency Use Authorization (EUA). This EUA will remain  in effect (meaning this test can be used) for the duration of the COVID-19 declaration under Section 564(b)(1) of the Act, 21 U.S.C.section 360bbb-3(b)(1), unless the authorization is terminated  or revoked sooner.       Influenza A by PCR NEGATIVE NEGATIVE Final   Influenza B by PCR NEGATIVE NEGATIVE Final    Comment: (NOTE) The Xpert Xpress SARS-CoV-2/FLU/RSV plus assay is intended as an aid in the diagnosis of influenza from Nasopharyngeal swab specimens and should not be used as a sole basis for treatment. Nasal washings and aspirates are unacceptable for Xpert Xpress SARS-CoV-2/FLU/RSV testing.  Fact Sheet for Patients: EntrepreneurPulse.com.au  Fact Sheet for Healthcare Providers: IncredibleEmployment.be  This test is not yet approved or cleared by the [place] FDA and has been authorized for detection and/or diagnosis of SARS-CoV-2 by FDA under an Emergency Use Authorization  (EUA). This EUA will remain in effect (meaning this test can be used) for the duration of the COVID-19 declaration under Section 564(b)(1) of the Act, 21 U.S.C. section 360bbb-3(b)(1), unless the authorization is terminated or revoked.  Performed at [facility], [place] [address], [place],  [address]      Time coordinating discharge: Over 30 minutes  SIGNED:   Darliss Cheney, MD  [facility] 03/17/2021, 10:10 AM  If 7PM-7AM, please contact night-coverage [email or web address]

## 2021-03-19 LAB — LEVETIRACETAM LEVEL: Levetiracetam Lvl: 6.9 ug/mL — ABNORMAL LOW (ref 10.0–40.0)

## 2021-03-20 ENCOUNTER — Telehealth: Payer: Self-pay | Admitting: Neurology

## 2021-03-20 NOTE — Telephone Encounter (Signed)
I called the patient.  I talk with husband.  The patient was recent hospital with urinary tract infection and a decline in her ability to walk.  She was not running fevers.  MRI evaluation did not show new lesions.  She is on prednisone 5 mg daily, she is now on antibiotics.  When the antibiotics are complete she will start a 6-day taper of the prednisone beginning at 30 mg daily and tapering by 5 mg each day getting back to 5 mg daily.  She is improving following hospitalization.

## 2021-03-20 NOTE — Telephone Encounter (Signed)
Pt's husband, Peola Joynt (on [name]) called hospital neurologist sent information to Dr. Jannifer Franklin about her condition. Want to know what neurologist sent about her condition and if we need to do something different. Would like a call from the nurse.

## 2021-03-20 NOTE — Telephone Encounter (Signed)
I called patient.  Left message, I will call back later.

## 2021-03-23 DIAGNOSIS — Z87440 Personal history of urinary (tract) infections: Secondary | ICD-10-CM | POA: Diagnosis not present

## 2021-03-23 DIAGNOSIS — Z09 Encounter for follow-up examination after completed treatment for conditions other than malignant neoplasm: Secondary | ICD-10-CM | POA: Diagnosis not present

## 2021-03-23 DIAGNOSIS — N319 Neuromuscular dysfunction of bladder, unspecified: Secondary | ICD-10-CM | POA: Diagnosis not present

## 2021-03-24 DIAGNOSIS — E7800 Pure hypercholesterolemia, unspecified: Secondary | ICD-10-CM | POA: Diagnosis not present

## 2021-03-24 DIAGNOSIS — Z86711 Personal history of pulmonary embolism: Secondary | ICD-10-CM | POA: Diagnosis not present

## 2021-03-24 DIAGNOSIS — F411 Generalized anxiety disorder: Secondary | ICD-10-CM | POA: Diagnosis not present

## 2021-03-24 DIAGNOSIS — K219 Gastro-esophageal reflux disease without esophagitis: Secondary | ICD-10-CM | POA: Diagnosis not present

## 2021-03-24 DIAGNOSIS — G2581 Restless legs syndrome: Secondary | ICD-10-CM | POA: Diagnosis not present

## 2021-03-24 DIAGNOSIS — Z7952 Long term (current) use of systemic steroids: Secondary | ICD-10-CM | POA: Diagnosis not present

## 2021-03-24 DIAGNOSIS — G8222 Paraplegia, incomplete: Secondary | ICD-10-CM | POA: Diagnosis not present

## 2021-03-24 DIAGNOSIS — M170 Bilateral primary osteoarthritis of knee: Secondary | ICD-10-CM | POA: Diagnosis not present

## 2021-03-24 DIAGNOSIS — I82502 Chronic embolism and thrombosis of unspecified deep veins of left lower extremity: Secondary | ICD-10-CM | POA: Diagnosis not present

## 2021-03-24 DIAGNOSIS — M792 Neuralgia and neuritis, unspecified: Secondary | ICD-10-CM | POA: Diagnosis not present

## 2021-03-24 DIAGNOSIS — Z87891 Personal history of nicotine dependence: Secondary | ICD-10-CM | POA: Diagnosis not present

## 2021-03-24 DIAGNOSIS — G8929 Other chronic pain: Secondary | ICD-10-CM | POA: Diagnosis not present

## 2021-03-24 DIAGNOSIS — M47812 Spondylosis without myelopathy or radiculopathy, cervical region: Secondary | ICD-10-CM | POA: Diagnosis not present

## 2021-03-24 DIAGNOSIS — Z9181 History of falling: Secondary | ICD-10-CM | POA: Diagnosis not present

## 2021-03-24 DIAGNOSIS — F5101 Primary insomnia: Secondary | ICD-10-CM | POA: Diagnosis not present

## 2021-03-24 DIAGNOSIS — Z7901 Long term (current) use of anticoagulants: Secondary | ICD-10-CM | POA: Diagnosis not present

## 2021-03-24 DIAGNOSIS — M810 Age-related osteoporosis without current pathological fracture: Secondary | ICD-10-CM | POA: Diagnosis not present

## 2021-03-24 DIAGNOSIS — F331 Major depressive disorder, recurrent, moderate: Secondary | ICD-10-CM | POA: Diagnosis not present

## 2021-03-24 DIAGNOSIS — M4802 Spinal stenosis, cervical region: Secondary | ICD-10-CM | POA: Diagnosis not present

## 2021-03-27 ENCOUNTER — Other Ambulatory Visit: Payer: Self-pay | Admitting: Neurology

## 2021-03-27 DIAGNOSIS — G8222 Paraplegia, incomplete: Secondary | ICD-10-CM

## 2021-03-27 DIAGNOSIS — G0490 Encephalitis and encephalomyelitis, unspecified: Secondary | ICD-10-CM

## 2021-03-28 DIAGNOSIS — R269 Unspecified abnormalities of gait and mobility: Secondary | ICD-10-CM | POA: Diagnosis not present

## 2021-03-28 DIAGNOSIS — G8222 Paraplegia, incomplete: Secondary | ICD-10-CM | POA: Diagnosis not present

## 2021-03-28 DIAGNOSIS — R296 Repeated falls: Secondary | ICD-10-CM | POA: Diagnosis not present

## 2021-03-28 DIAGNOSIS — Z09 Encounter for follow-up examination after completed treatment for conditions other than malignant neoplasm: Secondary | ICD-10-CM | POA: Diagnosis not present

## 2021-04-14 ENCOUNTER — Other Ambulatory Visit: Payer: Self-pay

## 2021-04-14 ENCOUNTER — Emergency Department (HOSPITAL_COMMUNITY): Payer: PPO

## 2021-04-14 ENCOUNTER — Encounter (HOSPITAL_COMMUNITY): Payer: Self-pay

## 2021-04-14 ENCOUNTER — Emergency Department (HOSPITAL_COMMUNITY)
Admission: EM | Admit: 2021-04-14 | Discharge: 2021-04-14 | Disposition: A | Discharge status: Home / Self Care | Payer: PPO | Attending: Emergency Medicine | Admitting: Emergency Medicine

## 2021-04-14 DIAGNOSIS — M545 Low back pain, unspecified: Secondary | ICD-10-CM | POA: Insufficient documentation

## 2021-04-14 DIAGNOSIS — M549 Dorsalgia, unspecified: Secondary | ICD-10-CM

## 2021-04-14 DIAGNOSIS — M546 Pain in thoracic spine: Secondary | ICD-10-CM | POA: Insufficient documentation

## 2021-04-14 DIAGNOSIS — S0990XA Unspecified injury of head, initial encounter: Secondary | ICD-10-CM | POA: Diagnosis not present

## 2021-04-14 DIAGNOSIS — I639 Cerebral infarction, unspecified: Secondary | ICD-10-CM | POA: Diagnosis not present

## 2021-04-14 DIAGNOSIS — W1839XA Other fall on same level, initial encounter: Secondary | ICD-10-CM | POA: Insufficient documentation

## 2021-04-14 DIAGNOSIS — R319 Hematuria, unspecified: Secondary | ICD-10-CM | POA: Diagnosis not present

## 2021-04-14 DIAGNOSIS — R1032 Left lower quadrant pain: Secondary | ICD-10-CM | POA: Diagnosis not present

## 2021-04-14 DIAGNOSIS — N189 Chronic kidney disease, unspecified: Secondary | ICD-10-CM | POA: Insufficient documentation

## 2021-04-14 DIAGNOSIS — Z960 Presence of urogenital implants: Secondary | ICD-10-CM | POA: Diagnosis not present

## 2021-04-14 DIAGNOSIS — M47812 Spondylosis without myelopathy or radiculopathy, cervical region: Secondary | ICD-10-CM | POA: Diagnosis not present

## 2021-04-14 DIAGNOSIS — Z87891 Personal history of nicotine dependence: Secondary | ICD-10-CM | POA: Diagnosis not present

## 2021-04-14 DIAGNOSIS — I6521 Occlusion and stenosis of right carotid artery: Secondary | ICD-10-CM | POA: Diagnosis not present

## 2021-04-14 DIAGNOSIS — I878 Other specified disorders of veins: Secondary | ICD-10-CM | POA: Diagnosis not present

## 2021-04-14 DIAGNOSIS — I739 Peripheral vascular disease, unspecified: Secondary | ICD-10-CM | POA: Diagnosis not present

## 2021-04-14 DIAGNOSIS — M542 Cervicalgia: Secondary | ICD-10-CM | POA: Diagnosis not present

## 2021-04-14 DIAGNOSIS — W19XXXA Unspecified fall, initial encounter: Secondary | ICD-10-CM | POA: Diagnosis not present

## 2021-04-14 DIAGNOSIS — M4692 Unspecified inflammatory spondylopathy, cervical region: Secondary | ICD-10-CM | POA: Diagnosis not present

## 2021-04-14 DIAGNOSIS — R9431 Abnormal electrocardiogram [ECG] [EKG]: Secondary | ICD-10-CM | POA: Diagnosis not present

## 2021-04-14 DIAGNOSIS — Z79899 Other long term (current) drug therapy: Secondary | ICD-10-CM | POA: Diagnosis not present

## 2021-04-14 DIAGNOSIS — G9389 Other specified disorders of brain: Secondary | ICD-10-CM | POA: Diagnosis not present

## 2021-04-14 DIAGNOSIS — R0902 Hypoxemia: Secondary | ICD-10-CM | POA: Diagnosis not present

## 2021-04-14 DIAGNOSIS — S199XXA Unspecified injury of neck, initial encounter: Secondary | ICD-10-CM | POA: Diagnosis not present

## 2021-04-14 DIAGNOSIS — Z9359 Other cystostomy status: Secondary | ICD-10-CM

## 2021-04-14 LAB — COMPREHENSIVE METABOLIC PANEL
ALT: 17 U/L (ref 0–44)
AST: 23 U/L (ref 15–41)
Albumin: 3.1 g/dL — ABNORMAL LOW (ref 3.5–5.0)
Alkaline Phosphatase: 73 U/L (ref 38–126)
Anion gap: 8 (ref 5–15)
BUN: 19 mg/dL (ref 8–23)
CO2: 28 mmol/L (ref 22–32)
Calcium: 8.4 mg/dL — ABNORMAL LOW (ref 8.9–10.3)
Chloride: 102 mmol/L (ref 98–111)
Creatinine, Ser: 0.88 mg/dL (ref 0.44–1.00)
GFR, Estimated: >60 mL/min (ref >60)
Glucose, Bld: 103 mg/dL — ABNORMAL HIGH (ref 70–99)
Potassium: 3.8 mmol/L (ref 3.5–5.1)
Sodium: 138 mmol/L (ref 135–145)
Total Bilirubin: 0.2 mg/dL — ABNORMAL LOW (ref 0.3–1.2)
Total Protein: 5.9 g/dL — ABNORMAL LOW (ref 6.5–8.1)

## 2021-04-14 LAB — URINALYSIS, ROUTINE W REFLEX MICROSCOPIC
Bilirubin Urine: NEGATIVE
Bilirubin Urine: NEGATIVE
Glucose, UA: NEGATIVE mg/dL
Glucose, UA: NEGATIVE mg/dL
Ketones, ur: NEGATIVE mg/dL
Ketones, ur: NEGATIVE mg/dL
Nitrite: NEGATIVE
Nitrite: NEGATIVE
Protein, ur: 30 mg/dL — AB
Protein, ur: NEGATIVE mg/dL
RBC / HPF: >50 RBC/hpf — ABNORMAL HIGH (ref 0–5)
Specific Gravity, Urine: 1.009 (ref 1.005–1.030)
Specific Gravity, Urine: 1.009 (ref 1.005–1.030)
WBC, UA: >50 WBC/hpf — ABNORMAL HIGH (ref 0–5)
WBC, UA: >50 WBC/hpf — ABNORMAL HIGH (ref 0–5)
pH: 6 (ref 5.0–8.0)
pH: 9 — ABNORMAL HIGH (ref 5.0–8.0)

## 2021-04-14 LAB — CBC WITH DIFFERENTIAL/PLATELET
Abs Immature Granulocytes: 0 10*3/uL (ref 0.00–0.07)
Basophils Absolute: 0.2 10*3/uL — ABNORMAL HIGH (ref 0.0–0.1)
Basophils Relative: 2 %
Eosinophils Absolute: 0.3 10*3/uL (ref 0.0–0.5)
Eosinophils Relative: 3 %
HCT: 35.4 % — ABNORMAL LOW (ref 36.0–46.0)
Hemoglobin: 10 g/dL — ABNORMAL LOW (ref 12.0–15.0)
Lymphocytes Relative: 14 %
Lymphs Abs: 1.3 10*3/uL (ref 0.7–4.0)
MCH: 25.6 pg — ABNORMAL LOW (ref 26.0–34.0)
MCHC: 28.2 g/dL — ABNORMAL LOW (ref 30.0–36.0)
MCV: 90.8 fL (ref 80.0–100.0)
Monocytes Absolute: 0.2 10*3/uL (ref 0.1–1.0)
Monocytes Relative: 2 %
Neutro Abs: 7.6 10*3/uL (ref 1.7–7.7)
Neutrophils Relative %: 79 %
Platelets: 201 10*3/uL (ref 150–400)
RBC: 3.9 MIL/uL (ref 3.87–5.11)
RDW: 23.1 % — ABNORMAL HIGH (ref 11.5–15.5)
WBC: 9.6 10*3/uL (ref 4.0–10.5)
nRBC: 0 % (ref 0.0–0.2)
nRBC: 0 /100 WBC

## 2021-04-14 LAB — LIPASE, BLOOD: Lipase: 29 U/L (ref 11–51)

## 2021-04-14 LAB — CBG MONITORING, ED: Glucose-Capillary: 118 mg/dL — ABNORMAL HIGH (ref 70–99)

## 2021-04-14 MED ORDER — NITROFURANTOIN MONOHYD MACRO 100 MG PO CAPS: 100.0000 mg | ORAL_CAPSULE | Freq: Two times a day (BID) | ORAL | 0 refills | Status: DC

## 2021-04-14 MED ORDER — ACETAMINOPHEN 500 MG PO TABS
1000.0000 mg | ORAL_TABLET | Freq: Once | ORAL | Status: DC
  Filled 2021-04-14: qty 2

## 2021-04-14 MED ORDER — NITROFURANTOIN MONOHYD MACRO 100 MG PO CAPS
100.0000 mg | ORAL_CAPSULE | Freq: Once | ORAL | Status: AC
  Administered 2021-04-14: 100 mg via ORAL
  Filled 2021-04-14: qty 1

## 2021-04-14 MED ORDER — FENTANYL CITRATE PF 50 MCG/ML IJ SOSY: 50.0000 ug | PREFILLED_SYRINGE | INTRAMUSCULAR | Status: DC | PRN

## 2021-04-14 NOTE — ED Notes (Signed)
Ptar cancelled per husband request

## 2021-04-14 NOTE — ED Provider Notes (Signed)
[facility] EMERGENCY DEPARTMENT Provider Note   CSN: [id number] Arrival date & time: 04/14/21  1356     History Chief Complaint  Patient presents with   Flank Pain   Back Pain    Katelyn Lamb is a 75 y.o. female.  HPI Patient is a 75 year old female with a past medical history significant for CKD, HSV encephalitis with chronic gait abnormalities, balance issues, memory difficulty, chronic back pain, anxiety, neurogenic bladder with suprapubic catheter.  Notably patient was admitted to the hospital for presumed UTI 1 month ago although culture grew out multiple species and recollection was suggested.  She also had MRI imaging of her spine at that time.  Patient is here today with left flank pain, left mid abdominal pain, neck and mid back pain and low back pain after fall that occurred 3 days ago per patient she fell backwards because her legs were weak and she smacked the back of her head on the ground has been having pain in her back abdomen and head since that time.  She states her headache is actually quite mild.  She is in further quantifies of her pain is a 1/10 at this time.  Circumferentially although slightly worse than the back.  She has not denies any visual symptoms no focal weakness or numbness that is new.  Denies any chest pain or shortness of breath lightheadedness or dizziness.  States that she just feels weak all over.  No aggravating / mitigating factors.       Past Medical History:  Diagnosis Date   Acute deep vein thrombosis (DVT) of popliteal vein of left lower extremity (HCC)    Anxiety    Back pain    Chronic kidney disease    Encephalomyelitis    Gait abnormality 11/14/2016   Memory difficulty 03/02/2019   Myelitis due to herpes simplex [facility])    Neurogenic bladder     Patient Active Problem List   Diagnosis Date Noted   Generalized weakness 03/15/2021   Acute cystitis with hematuria 03/15/2021   Encephalopathy 03/15/2021    Recurrent falls 03/15/2021   Right pulmonary embolus ([place]) 02/12/2020   Lower extremity weakness 02/02/2020   Macrocytic anemia 02/02/2020   Spinal stenosis 02/02/2020   History of recurrent UTI (urinary tract infection)    Bacterial infection due to Klebsiella pneumoniae    Laceration of left hand 12/09/2019   DNR (do not resuscitate) 12/09/2019   Pelvic fracture ([place]) 12/06/2019   AKI (acute kidney injury) ([place]) 12/06/2019   Hematuria 07/23/2019   Hypokalemia 07/23/2019   Acute on chronic anemia 07/23/2019   Fever 07/23/2019   Leukocytosis 07/23/2019   Sepsis ([place]) 07/23/2019   Generalized anxiety disorder 06/17/2019   Menopausal sweats 06/17/2019   Memory difficulty 03/02/2019   Degenerative spondylolisthesis 09/02/2018   Delirium 03/26/2018   Degeneration of lumbar intervertebral disc 11/08/2017   Lumbar radiculopathy 11/06/2017   Chronic neck pain 09/12/2017   Chronic low back pain 09/06/2017   Chronic pain syndrome 09/06/2017   Dilated pancreatic duct 01/24/2017   DVT, lower extremity, distal, chronic ([place]) 01/22/2017   Elevated serum GGT level 01/22/2017   Medication monitoring encounter 01/08/2017   Neurologic gait dysfunction 11/14/2016   Hypotension due to drugs    Urinary retention    Anxiety about health    Reactive depression    Ataxia    Abdominal spasms    Constipation due to pain medication    Acute lower UTI    Dysuria  Acute deep vein thrombosis (DVT) of popliteal vein of left lower extremity (HCC)    Incomplete paraplegia (HCC)    Acute blood loss anemia    Neurogenic bladder    Neuropathic pain    Muscle spasm    Gastroesophageal reflux disease    Slow transit constipation    Thrombocytopenia (HCC) 10/03/2016   Abnormal MRI, spinal cord    Encephalomyelitis    Numbness    Intractable back pain 09/20/2016   Numbness of left lower extremity 09/20/2016   Hyponatremia 09/20/2016   Herpes zoster without complication 0000000   Spondylosis of  cervical region without myelopathy or radiculopathy 06/16/2015    Past Surgical History:  Procedure Laterality Date   ABDOMINAL HYSTERECTOMY     BLADDER REPAIR     CESAREAN SECTION     IR KYPHO LUMBAR INC FX REDUCE BONE BX UNI/BIL CANNULATION INC/IMAGING  04/11/2018   TUBAL LIGATION       OB History   No obstetric history on file.     Family History  Problem Relation Age of Onset   Hypertension Mother     Social History   Tobacco Use   Smoking status: Former   Smokeless tobacco: Never   Tobacco comments:    40 years ago   Vaping Use   Vaping Use: Never used  Substance Use Topics   Alcohol use: No   Drug use: Never    Home Medications Prior to Admission medications   Medication Sig Start Date End Date Taking? Authorizing Provider  acyclovir (ZOVIRAX) 400 MG tablet TAKE 1 TABLET BY MOUTH TWICE A DAY Patient taking differently: Take 400 mg by mouth 2 (two) times daily. 03/27/21  Yes [name], MD  diazepam (VALIUM) 5 MG tablet TAKE 1 TABLET (5 MG TOTAL) BY MOUTH IN THE MORNING AND AT BEDTIME. Patient taking differently: Take 5 mg by mouth in the morning and at bedtime. For tremors 02/14/21  Yes [name], MD  diclofenac Sodium (VOLTAREN) 1 % GEL Apply 1 application topically 2 (two) times daily as needed (nerve pain).   Yes [provider]  docusate sodium (COLACE) 100 MG capsule Take 200 mg by mouth daily as needed (constipation).   Yes [provider]  DULoxetine (CYMBALTA) 30 MG capsule TAKE 1 CAPSULE BY MOUTH EVERY DAY IN THE MORNING Patient taking differently: Take 30 mg by mouth every morning. Also take 60 mg at bedtime 11/16/20  Yes [name], MD  DULoxetine (CYMBALTA) 60 MG capsule TAKE 1 CAPSULE BY MOUTH EVERYDAY AT BEDTIME Patient taking differently: Take 60 mg by mouth at bedtime. Also take 30 mg every morning 02/27/21  Yes [name], MD  Emollient (CERAVE) CREA Apply 1 application topically daily as needed (bruise  spots).   Yes [provider]  ferrous sulfate 325 (65 FE) MG tablet Take 325 mg by mouth every morning. 03/10/21  Yes [provider]  HYDROcodone-acetaminophen (NORCO) 10-325 MG tablet Take 1 tablet by mouth every 6 (six) hours as needed for pain. 03/07/18  Yes [provider]  levETIRAcetam (KEPPRA) 250 MG tablet Take 1 tablet (250 mg total) by mouth 2 (two) times daily. 02/09/21  Yes [name], MD  mirabegron ER (MYRBETRIQ) 50 MG TB24 tablet Take 50 mg by mouth at bedtime.   Yes [provider]  Multiple Vitamins-Minerals (HAIR/SKIN/NAILS/BIOTIN) TABS Take 1 tablet by mouth every morning.   Yes [provider]  naproxen sodium (ALEVE) 220 MG tablet Take  220 mg by mouth 2 (two) times daily as needed (headache).   Yes [provider]  nitrofurantoin, macrocrystal-monohydrate, (MACROBID) 100 MG capsule Take 1 capsule (100 mg total) by mouth 2 (two) times daily. 04/14/21  Yes Emory Gallentine, Ova Freshwater S, PA  omega-3 acid ethyl esters (LOVAZA) 1 g capsule Take 1 g by mouth every morning.   Yes [provider]  pantoprazole (PROTONIX) 40 MG tablet Take 1 tablet (40 mg total) by mouth daily. Patient taking differently: Take 40 mg by mouth at bedtime. 10/26/16  Yes Angiulli, Lavon Paganini, PA-C  polyethylene glycol (MIRALAX / GLYCOLAX) packet Take 17 g by mouth daily. Patient taking differently: Take 17 g by mouth daily as needed (constipation). 10/26/16  Yes Angiulli, Lavon Paganini, PA-C  predniSONE (DELTASONE) 5 MG tablet TAKE 1 TABLET BY MOUTH EVERY DAY WITH BREAKFAST Patient taking differently: Take 5 mg by mouth daily with breakfast. 03/27/21  Yes [name], MD  pregabalin (LYRICA) 50 MG capsule Take 2 capsules (100 mg total) by mouth at bedtime. 02/09/21  Yes [name], MD  QUEtiapine (SEROQUEL) 100 MG tablet Take 100 mg by mouth at bedtime. 01/25/21  Yes [provider]  rivaroxaban (XARELTO) 20 MG TABS tablet Take 1 tablet (20 mg  total) by mouth daily with supper. Patient taking differently: Take 20 mg by mouth See admin instructions. Take one tablet (20 mg) by mouth daily with breakfast or lunch 03/06/20  Yes Cherene Altes, MD    Allergies    Demerol [meperidine], Percocet [oxycodone-acetaminophen], Amoxicillin-pot clavulanate, and Penicillins  Review of Systems   Review of Systems  Constitutional:  Negative for chills and fever.  HENT:  Negative for congestion.   Eyes:  Negative for pain.  Respiratory:  Negative for cough and shortness of breath.   Cardiovascular:  Negative for chest pain and leg swelling.  Gastrointestinal:  Positive for abdominal pain. Negative for vomiting.  Genitourinary:  Negative for dysuria.  Musculoskeletal:  Negative for myalgias.       Neck, thoracic and lumbar back pain, headache  Skin:  Negative for rash.  Neurological:  Negative for dizziness and headaches.   Physical Exam Updated Vital Signs BP 104/76   Pulse 80   Temp 97.9 F (36.6 C) (Oral)   Resp 15   Ht '5\' 7"'$  (1.702 m)   Wt 68 kg   SpO2 91%   BMI 23.49 kg/m   Physical Exam Vitals and nursing note reviewed.  Constitutional:      General: She is not in acute distress.    Comments: Chronically ill-appearing 75 year old female does not appear acutely uncomfortable at this time.  Is in no acute distress.  Is not acutely toxic.  Pleasant.  Poor historian.  HENT:     Head: Normocephalic and atraumatic.     Nose: Nose normal.  Eyes:     General: No scleral icterus. Neck:     Comments: Midline tenderness to palpation of cervical spine Cardiovascular:     Rate and Rhythm: Normal rate and regular rhythm.     Pulses: Normal pulses.     Heart sounds: Normal heart sounds.  Pulmonary:     Effort: Pulmonary effort is normal. No respiratory distress.     Breath sounds: Normal breath sounds. No wheezing.  Abdominal:     Palpations: Abdomen is soft.     Tenderness: There is abdominal tenderness.     Comments: Left  mid and left lower abdominal tenderness to palpation.  No guarding  or rebound.  Genitourinary:    Comments: Deferred. Musculoskeletal:     Cervical back: Normal range of motion.     Right lower leg: No edema.     Left lower leg: No edema.     Comments: No lower extremity edema.  No calf tenderness.  There is some mild diffuse C, T, L-spine tenderness palpation.  Skin:    General: Skin is warm and dry.     Capillary Refill: Capillary refill takes less than 2 seconds.     Comments: No bruising to either lower extremity, low back, torso abdomen chest or neck or head.  No cranial tenderness to palpation.  Neurological:     Mental Status: She is alert. Mental status is at baseline.     Comments: Moves all 4 extremities.  Strength intact in all 4 extremities.  Smile symmetric, EOMI.  PERRLA.  Follows all commands.  Alert and oriented x3.  Psychiatric:        Mood and Affect: Mood normal.        Behavior: Behavior normal.    ED Results / Procedures / Treatments   Labs (all labs ordered are listed, but only abnormal results are displayed) Labs Reviewed  URINALYSIS, ROUTINE W REFLEX MICROSCOPIC - Abnormal; Notable for the following components:      Result Value   Color, Urine AMBER (*)    APPearance TURBID (*)    Hgb urine dipstick LARGE (*)    Protein, ur 30 (*)    Leukocytes,Ua LARGE (*)    RBC / HPF >50 (*)    WBC, UA >50 (*)    Bacteria, UA MANY (*)    All other components within normal limits  CBC WITH DIFFERENTIAL/PLATELET - Abnormal; Notable for the following components:   Hemoglobin 10.0 (*)    HCT 35.4 (*)    MCH 25.6 (*)    MCHC 28.2 (*)    RDW 23.1 (*)    Basophils Absolute 0.2 (*)    All other components within normal limits  COMPREHENSIVE METABOLIC PANEL - Abnormal; Notable for the following components:   Glucose, Bld 103 (*)    Calcium 8.4 (*)    Total Protein 5.9 (*)    Albumin 3.1 (*)    Total Bilirubin 0.2 (*)    All other components within normal limits   URINALYSIS, ROUTINE W REFLEX MICROSCOPIC - Abnormal; Notable for the following components:   APPearance TURBID (*)    pH 9.0 (*)    Hgb urine dipstick MODERATE (*)    Leukocytes,Ua LARGE (*)    WBC, UA >50 (*)    Bacteria, UA MANY (*)    All other components within normal limits  CBG MONITORING, ED - Abnormal; Notable for the following components:   Glucose-Capillary 118 (*)    All other components within normal limits  URINE CULTURE  LIPASE, BLOOD    EKG EKG Interpretation  Date/Time:  Friday April 14 2021 14:43:09 EDT Ventricular Rate:  84 PR Interval:  158 QRS Duration: 89 QT Interval:  391 QTC Calculation: 463 R Axis:   82 Text Interpretation: Sinus rhythm Borderline right axis deviation Low voltage, extremity and precordial leads No significant change since prior 6/22 Confirmed by Aletta Edouard [phone]) on 04/14/2021 5:37:26 PM  Radiology CT ABDOMEN PELVIS WO CONTRAST  Result Date: 04/14/2021 CLINICAL DATA:  LEFT lower quadrant and LEFT flank pain, some hematuria, question nephrolithiasis. Also some traumatic back pain post fall 3 days ago, question rib fracture/contusion, fell trying  to transfer to bed from [place]: CT ABDOMEN AND PELVIS WITHOUT CONTRAST TECHNIQUE: Multidetector CT imaging of the abdomen and pelvis was performed following the standard protocol without IV contrast. Sagittal and coronal MPR images reconstructed from axial data set. No oral contrast administered. COMPARISON:  12/06/2019 CT pelvis, CT abdomen and pelvis 07/23/2019 FINDINGS: Lower chest: Lung bases clear. Hepatobiliary: Gallbladder and liver normal appearance Pancreas: Normal appearance Spleen: Normal appearance Adrenals/Urinary Tract: Adrenal glands, kidneys, and ureters normal appearance. Bladder decompressed by suprapubic catheter. No urinary tract calcification or dilatation. Perinephric fat planes clear. Stomach/Bowel: Normal appendix. Stomach and bowel loops normal appearance.  Vascular/Lymphatic: Scattered pelvic phleboliths. Scattered atherosclerotic calcifications of aorta and iliac arteries without aneurysm. No adenopathy. Reproductive: Uterus surgically absent with nonvisualization of ovaries Other: No free air or free fluid.  No hernia. Musculoskeletal: Diffuse osseous demineralization. Old healed deformity of LEFT inferior pubic ramus. No acute osseous findings. Prior spinal augmentation procedures at L2 and L5. Mild chronic height losses of L3 and L4 vertebral bodies. IMPRESSION: No acute intra-abdominal or intrapelvic abnormalities. Specifically, no evidence of urinary tract calcification or obstruction. Prior spinal augmentation procedures at L2 and L5 with mild chronic height losses of L3 and L4 vertebral bodies. Aortic Atherosclerosis (ICD10-I70.0). Electronically Signed   By: Lavonia Dana M.D.   On: 04/14/2021 16:18   CT HEAD WO CONTRAST (5MM)  Result Date: 04/14/2021 CLINICAL DATA:  Head trauma EXAM: CT HEAD WITHOUT CONTRAST TECHNIQUE: Contiguous axial images were obtained from the base of the skull through the vertex without intravenous contrast. COMPARISON:  Brain MRI 02/13/2021, CT head 02/13/2021 FINDINGS: Brain: There is no evidence of acute intracranial hemorrhage, extra-axial fluid collection, or infarct. There is a remote infarct in the right superior frontal gyrus, unchanged. Confluent hypodensity throughout the subcortical and periventricular white matter likely reflects sequela of chronic white matter microangiopathy. There is moderate global parenchymal volume loss with commensurate enlargement of the ventricular system, unchanged. No mass lesion is identified. There is no midline shift Vascular: There is calcification of the bilateral cavernous ICAs. Skull: There is no acute fracture. There is no suspicious osseous lesion. Sinuses/Orbits: The imaged paranasal sinuses are clear. Bilateral lens implants are noted. The globes and orbits are otherwise  unremarkable. Other: There is a trace right mastoid effusion decreased from the prior MRI. IMPRESSION: 1. No acute intracranial hemorrhage or calvarial fracture. 2. Remote infarct in the right superior frontal gyrus and advanced chronic white matter microangiopathy, unchanged. 3. Small right mastoid effusion, decreased from the prior MRI. Electronically Signed   By: Valetta Mole M.D.   On: 04/14/2021 16:09   CT Cervical Spine Wo Contrast  Result Date: 04/14/2021 CLINICAL DATA:  Neck trauma EXAM: CT CERVICAL SPINE WITHOUT CONTRAST TECHNIQUE: Multidetector CT imaging of the cervical spine was performed without intravenous contrast. Multiplanar CT image reconstructions were also generated. COMPARISON:  Cervical spine CT 03/15/2021 FINDINGS: Alignment: There is reversal of the normal cervical spine lordosis centered at C3-C4, unchanged. There is no anterior or retrolisthesis. There is no jumped or perched facet. Skull base and vertebrae: Vertebral body heights are preserved. There is no evidence of acute fracture. Soft tissues and spinal canal: No prevertebral fluid or swelling. No visible canal hematoma. Disc levels: Multilevel uncovertebral and facet arthropathy is again seen, most advanced at C4-C5 and C5-C6, unchanged and better delineated on the MRI cervical spine of 02/02/2020. Upper chest: The partially imaged lung apices are clear. Other: There is calcified atherosclerotic plaque at the right carotid bulb. IMPRESSION:  No acute fracture or traumatic malalignment of the cervical spine. Electronically Signed   By: Valetta Mole M.D.   On: 04/14/2021 16:14   CT Thoracic Spine Wo Contrast  Result Date: 04/14/2021 CLINICAL DATA:  Mid back pain, midline tenderness after recent fall EXAM: CT Thoracic and Lumbar spine  Contrast TECHNIQUE: Multiplanar CT images of the thoracic and lumbar spine were reconstructed from contemporary CT of the Chest, Abdomen, and Pelvis CONTRAST:  None COMPARISON:  CT chest 02/12/2020  FINDINGS: CT THORACIC SPINE FINDINGS Alignment: No significant listhesis. Vertebrae: No new loss of vertebral body height. No acute fracture. Paraspinal and other soft tissues: Unremarkable. Disc levels: No significant osseous encroachment on the spinal canal. CT LUMBAR SPINE FINDINGS Segmentation: 5 lumbar type vertebrae. Alignment: Grade 1 anterolisthesis at L4-L5. Vertebrae: Compression fractures at L2 and L5 with cement augmentation. Additional chronic appearing compression fractures at L3 and L4. No significant endplate retropulsion. No acute fracture identified. Paraspinal and other soft tissues: Aortic atherosclerosis. Disc levels: Multilevel disc bulges and facet hypertrophy with ligamentum flavum thickening. Moderate canal stenosis at lower lumbar levels. Foraminal narrowing greatest at L4-L5 and on the left at L5-S1. IMPRESSION: No acute fracture of the thoracolumbar spine. Electronically Signed   By: Macy Mis M.D.   On: 04/14/2021 16:01   CT L-SPINE NO CHARGE  Result Date: 04/14/2021 CLINICAL DATA:  Mid back pain, midline tenderness after recent fall EXAM: CT Thoracic and Lumbar spine  Contrast TECHNIQUE: Multiplanar CT images of the thoracic and lumbar spine were reconstructed from contemporary CT of the Chest, Abdomen, and Pelvis CONTRAST:  None COMPARISON:  CT chest 02/12/2020 FINDINGS: CT THORACIC SPINE FINDINGS Alignment: No significant listhesis. Vertebrae: No new loss of vertebral body height. No acute fracture. Paraspinal and other soft tissues: Unremarkable. Disc levels: No significant osseous encroachment on the spinal canal. CT LUMBAR SPINE FINDINGS Segmentation: 5 lumbar type vertebrae. Alignment: Grade 1 anterolisthesis at L4-L5. Vertebrae: Compression fractures at L2 and L5 with cement augmentation. Additional chronic appearing compression fractures at L3 and L4. No significant endplate retropulsion. No acute fracture identified. Paraspinal and other soft tissues: Aortic  atherosclerosis. Disc levels: Multilevel disc bulges and facet hypertrophy with ligamentum flavum thickening. Moderate canal stenosis at lower lumbar levels. Foraminal narrowing greatest at L4-L5 and on the left at L5-S1. IMPRESSION: No acute fracture of the thoracolumbar spine. Electronically Signed   By: Macy Mis M.D.   On: 04/14/2021 16:01    Procedures Procedures   Medications Ordered in ED Medications  acetaminophen (TYLENOL) tablet 1,000 mg (1,000 mg Oral Patient Refused/Not Given 04/14/21 1617)  fentaNYL (SUBLIMAZE) injection 50 mcg (has no administration in time range)  nitrofurantoin (macrocrystal-monohydrate) (MACROBID) capsule 100 mg (has no administration in time range)    ED Course  I have reviewed the triage vital signs and the nursing notes.  Pertinent labs & imaging results that were available during my care of the patient were reviewed by me and considered in my medical decision making (see chart for details).  Clinical Course as of 04/14/21 1859  Fri Apr 14, 2021  1528 Urinalysis, Routine w reflex microscopic(!) Collected from foley bag - will recollect from tubing directly.  [WF]  [id number] IMPRESSION: No acute fracture of the thoracolumbar spine.   [WF]  1708 IMPRESSION: 1. No acute intracranial hemorrhage or calvarial fracture. 2. Remote infarct in the right superior frontal gyrus and advanced chronic white matter microangiopathy, unchanged. 3. Small right mastoid effusion, decreased from the prior MRI. [WF]  1709 No  acute changes on CT abd/pelvis or CT Cspine    IMPRESSION: No acute intra-abdominal or intrapelvic abnormalities.   Specifically, no evidence of urinary tract calcification or obstruction.   Prior spinal augmentation procedures at L2 and L5 with mild chronic height losses of L3 and L4 vertebral bodies.   [WF]  1709 Hemoglobin(!): 10.0 Hemoglobin improved from prior.  No evidence of active bleeding.  CMP unremarkable.  Lipase within  normal limits. [WF]    Clinical Course User Index [WF] Tedd Sias, [place]   MDM Rules/Calculators/A&P                           Patient is a 75 year old female with chronic gait abnormalities and neurologic disease she is followed by neurology outpatient.  She was admitted to the hospital for fall 1 month ago and was discharged home.  She is here again for another fall this occurred 3 days ago has been at bedside states that she normally feels better a couple days after the fall but has not in this instance which worried him.  He brought her to the ER for evaluation.  She has diffuse vertebral tenderness palpation no focal bony tenderness.  She has some abdominal tenderness as well.  Given her age and today complained extensive work-up was obtained including thoracic, cervical, lumbar CT imaging including CT abdomen pelvis without contrast to evaluate for pelvic fracture and obvious intra-abdominal pathology also CT head.  These images were reviewed personally myself.  Radiology read these are particularly remarkable.  Hemoglobin actually improved from prior.  Blood sugar is grossly within normal limits. Initial urinalysis was obtained from the bag follow-up urinalysis shows many bacteria, many WBC, large leukocytes.  Negative for nitrates.  Some moderate hemoglobin.  Suspect this is chronic debilitation/failure to thrive.  No significant acute changes.  She is actually pain-free at this time and declined both Tylenol and any other medications although she is excepting of Macrobid.  Urine culture during her last hospital stay actually grew out multiple species and was thought to be contaminated. Seems that she improved with time after her last fall.  Believe that she is reasonable for outpatient follow-up.  She will need to closely follow-up with her urology, neurology, PCP team.  Strict return cautions were given.  Patient is understanding of this.  Will prescribe Macrobid.  Discharged home.   Is discussing with PCP home health.  Encouraged him to continue to ensure that this occurs.  Final Clinical Impression(s) / ED Diagnoses Final diagnoses:  Low back pain  Fall, initial encounter  Acute back pain, unspecified back location, unspecified back pain laterality  Injury of head, initial encounter  Suprapubic catheter [place] [facility])    Rx / DC Orders ED Discharge Orders          Ordered    nitrofurantoin, macrocrystal-monohydrate, (MACROBID) 100 MG capsule  2 times daily        04/14/21 1845             Tedd Sias, [place] 04/14/21 1905    Pattricia Boss, MD 04/17/21 [phone]

## 2021-04-14 NOTE — ED Triage Notes (Signed)
Pt BIB [facility] c/o back pain and left flank pain after a fall that happened 3 days ago. Pt is bed bound at baseline and was trying to transfer from a BSC to the bed and fell in the floor. Pt's husband wants her evaluated. Pt is alert and oriented.

## 2021-04-14 NOTE — Discharge Instructions (Addendum)
Your work-up today was quite reassuring I do not see any fractures from her fall.  Please follow-up with your urologist.  Please take Tylenol 1000 mg every 6 hours for pain.  I also recommend applying Voltaren gel to the areas that are causing discomfort.  Your urinalysis is somewhat unusual appearing this may be because of the suprapubic catheter however we will treat with antibiotics and have you follow-up closely with urology and your primary care provider.  Malays return to the ER for any new or concerning symptoms.  Your urine culture is running currently and the results of this will help guide whether to continue antibiotics or cease using antibiotics.

## 2021-04-14 NOTE — ED Notes (Signed)
Pt d/c home per MD order. Discharge summary reviewed with pt and husband. Verbalize understanding. Discharge ride home here. Pt off unit via wheelchair and gait belt. Assisted to car. No s/s of acute distress noted .

## 2021-04-14 NOTE — ED Notes (Signed)
Ptar called pt is number 12 on th list

## 2021-04-19 LAB — URINE CULTURE: Culture: >=100000 — AB

## 2021-04-20 ENCOUNTER — Telehealth: Payer: Self-pay

## 2021-04-20 DIAGNOSIS — Z7901 Long term (current) use of anticoagulants: Secondary | ICD-10-CM | POA: Diagnosis not present

## 2021-04-20 DIAGNOSIS — K219 Gastro-esophageal reflux disease without esophagitis: Secondary | ICD-10-CM | POA: Diagnosis not present

## 2021-04-20 DIAGNOSIS — Z86711 Personal history of pulmonary embolism: Secondary | ICD-10-CM | POA: Diagnosis not present

## 2021-04-20 DIAGNOSIS — G8929 Other chronic pain: Secondary | ICD-10-CM | POA: Diagnosis not present

## 2021-04-20 DIAGNOSIS — F411 Generalized anxiety disorder: Secondary | ICD-10-CM | POA: Diagnosis not present

## 2021-04-20 DIAGNOSIS — Z87891 Personal history of nicotine dependence: Secondary | ICD-10-CM | POA: Diagnosis not present

## 2021-04-20 DIAGNOSIS — G2581 Restless legs syndrome: Secondary | ICD-10-CM | POA: Diagnosis not present

## 2021-04-20 DIAGNOSIS — M47814 Spondylosis without myelopathy or radiculopathy, thoracic region: Secondary | ICD-10-CM | POA: Diagnosis not present

## 2021-04-20 DIAGNOSIS — G8222 Paraplegia, incomplete: Secondary | ICD-10-CM | POA: Diagnosis not present

## 2021-04-20 DIAGNOSIS — Z87440 Personal history of urinary (tract) infections: Secondary | ICD-10-CM | POA: Diagnosis not present

## 2021-04-20 DIAGNOSIS — D696 Thrombocytopenia, unspecified: Secondary | ICD-10-CM | POA: Diagnosis not present

## 2021-04-20 DIAGNOSIS — N319 Neuromuscular dysfunction of bladder, unspecified: Secondary | ICD-10-CM | POA: Diagnosis not present

## 2021-04-20 DIAGNOSIS — F5101 Primary insomnia: Secondary | ICD-10-CM | POA: Diagnosis not present

## 2021-04-20 DIAGNOSIS — M170 Bilateral primary osteoarthritis of knee: Secondary | ICD-10-CM | POA: Diagnosis not present

## 2021-04-20 DIAGNOSIS — M810 Age-related osteoporosis without current pathological fracture: Secondary | ICD-10-CM | POA: Diagnosis not present

## 2021-04-20 DIAGNOSIS — F331 Major depressive disorder, recurrent, moderate: Secondary | ICD-10-CM | POA: Diagnosis not present

## 2021-04-20 DIAGNOSIS — Z9350 Unspecified cystostomy status: Secondary | ICD-10-CM | POA: Diagnosis not present

## 2021-04-20 DIAGNOSIS — M47812 Spondylosis without myelopathy or radiculopathy, cervical region: Secondary | ICD-10-CM | POA: Diagnosis not present

## 2021-04-20 DIAGNOSIS — M792 Neuralgia and neuritis, unspecified: Secondary | ICD-10-CM | POA: Diagnosis not present

## 2021-04-20 DIAGNOSIS — M4802 Spinal stenosis, cervical region: Secondary | ICD-10-CM | POA: Diagnosis not present

## 2021-04-20 DIAGNOSIS — I82502 Chronic embolism and thrombosis of unspecified deep veins of left lower extremity: Secondary | ICD-10-CM | POA: Diagnosis not present

## 2021-04-20 DIAGNOSIS — Z7952 Long term (current) use of systemic steroids: Secondary | ICD-10-CM | POA: Diagnosis not present

## 2021-04-20 DIAGNOSIS — Z9181 History of falling: Secondary | ICD-10-CM | POA: Diagnosis not present

## 2021-04-20 DIAGNOSIS — E7800 Pure hypercholesterolemia, unspecified: Secondary | ICD-10-CM | POA: Diagnosis not present

## 2021-04-20 NOTE — Telephone Encounter (Signed)
Post ED Visit - Positive Culture Follow-up  Culture report reviewed by antimicrobial stewardship pharmacist: [place] [facility] '[x]'$  Andreas Blower, Pharm.D. '[]'$  Heide Guile, Pharm.D., BCPS AQ-ID '[]'$  Parks Neptune, Pharm.D., BCPS '[]'$  Alycia Rossetti, Pharm.D., BCPS '[]'$  H[place], Pharm.D., BCPS, AAHIVP '[]'$  Legrand Como, Pharm.D., BCPS, AAHIVP '[]'$  Salome Arnt, PharmD, BCPS '[]'$  Johnnette Gourd, PharmD, BCPS '[]'$  Hughes Better, PharmD, BCPS '[]'$  Leeroy Cha, PharmD '[]'$  Laqueta Linden, PharmD, BCPS '[]'$  Albertina Parr, PharmD  [place] Team '[]'$  Leodis Sias, PharmD '[]'$  Lindell Spar, PharmD '[]'$  Royetta Asal, PharmD '[]'$  Graylin Shiver, Rph '[]'$  Rema Fendt) Glennon Mac, PharmD '[]'$  Arlyn Dunning, PharmD '[]'$  Netta Cedars, PharmD '[]'$  Dia Sitter, PharmD '[]'$  Leone Haven, PharmD '[]'$  Gretta Arab, PharmD '[]'$  Theodis Shove, PharmD '[]'$  Peggyann Juba, PharmD '[]'$  Reuel Boom, PharmD   Positive urine culture Treated with Nitrofurantoin Monohyd Macro , organism sensitive to the same and no further patient follow-up is required at this time.  Glennon Hamilton 04/20/2021, 9:57 AM

## 2021-05-01 DIAGNOSIS — Z4803 Encounter for change or removal of drains: Secondary | ICD-10-CM | POA: Diagnosis not present

## 2021-05-04 DIAGNOSIS — F331 Major depressive disorder, recurrent, moderate: Secondary | ICD-10-CM | POA: Diagnosis not present

## 2021-05-04 DIAGNOSIS — G2581 Restless legs syndrome: Secondary | ICD-10-CM | POA: Diagnosis not present

## 2021-05-04 DIAGNOSIS — G8222 Paraplegia, incomplete: Secondary | ICD-10-CM | POA: Diagnosis not present

## 2021-05-04 DIAGNOSIS — M170 Bilateral primary osteoarthritis of knee: Secondary | ICD-10-CM | POA: Diagnosis not present

## 2021-05-04 DIAGNOSIS — F5101 Primary insomnia: Secondary | ICD-10-CM | POA: Diagnosis not present

## 2021-05-04 DIAGNOSIS — I82502 Chronic embolism and thrombosis of unspecified deep veins of left lower extremity: Secondary | ICD-10-CM | POA: Diagnosis not present

## 2021-05-04 DIAGNOSIS — M792 Neuralgia and neuritis, unspecified: Secondary | ICD-10-CM | POA: Diagnosis not present

## 2021-05-04 DIAGNOSIS — Z7952 Long term (current) use of systemic steroids: Secondary | ICD-10-CM | POA: Diagnosis not present

## 2021-05-04 DIAGNOSIS — Z9350 Unspecified cystostomy status: Secondary | ICD-10-CM | POA: Diagnosis not present

## 2021-05-04 DIAGNOSIS — D696 Thrombocytopenia, unspecified: Secondary | ICD-10-CM | POA: Diagnosis not present

## 2021-05-04 DIAGNOSIS — M47814 Spondylosis without myelopathy or radiculopathy, thoracic region: Secondary | ICD-10-CM | POA: Diagnosis not present

## 2021-05-04 DIAGNOSIS — N319 Neuromuscular dysfunction of bladder, unspecified: Secondary | ICD-10-CM | POA: Diagnosis not present

## 2021-05-04 DIAGNOSIS — K219 Gastro-esophageal reflux disease without esophagitis: Secondary | ICD-10-CM | POA: Diagnosis not present

## 2021-05-04 DIAGNOSIS — M47812 Spondylosis without myelopathy or radiculopathy, cervical region: Secondary | ICD-10-CM | POA: Diagnosis not present

## 2021-05-04 DIAGNOSIS — F411 Generalized anxiety disorder: Secondary | ICD-10-CM | POA: Diagnosis not present

## 2021-05-04 DIAGNOSIS — M810 Age-related osteoporosis without current pathological fracture: Secondary | ICD-10-CM | POA: Diagnosis not present

## 2021-05-04 DIAGNOSIS — E7800 Pure hypercholesterolemia, unspecified: Secondary | ICD-10-CM | POA: Diagnosis not present

## 2021-05-04 DIAGNOSIS — Z86711 Personal history of pulmonary embolism: Secondary | ICD-10-CM | POA: Diagnosis not present

## 2021-05-04 DIAGNOSIS — Z7901 Long term (current) use of anticoagulants: Secondary | ICD-10-CM | POA: Diagnosis not present

## 2021-05-04 DIAGNOSIS — M4802 Spinal stenosis, cervical region: Secondary | ICD-10-CM | POA: Diagnosis not present

## 2021-05-04 DIAGNOSIS — Z9181 History of falling: Secondary | ICD-10-CM | POA: Diagnosis not present

## 2021-05-04 DIAGNOSIS — Z87440 Personal history of urinary (tract) infections: Secondary | ICD-10-CM | POA: Diagnosis not present

## 2021-05-04 DIAGNOSIS — G8929 Other chronic pain: Secondary | ICD-10-CM | POA: Diagnosis not present

## 2021-05-04 DIAGNOSIS — Z87891 Personal history of nicotine dependence: Secondary | ICD-10-CM | POA: Diagnosis not present

## 2021-05-09 ENCOUNTER — Other Ambulatory Visit: Payer: Self-pay | Admitting: Neurology

## 2021-05-09 ENCOUNTER — Telehealth: Payer: Self-pay | Admitting: Neurology

## 2021-05-09 MED ORDER — PREDNISONE 10 MG PO TABS: ORAL_TABLET | ORAL | 0 refills | Status: DC

## 2021-05-09 NOTE — Telephone Encounter (Signed)
I called and left messages on home phone and the mobile phone, I will try to call back later.

## 2021-05-09 NOTE — Telephone Encounter (Signed)
I called the patient, talk with the husband.  The patient has had some decline in ability to walk over the last couple weeks.  She seems to undulate with her abilities over time.  She has been to the ER on 12 August and saw her neurologist in the last several days.  She is not walking well currently.  We will try a course of prednisone, 6-day taper of 10 mg returning back to 5 mg daily.

## 2021-05-09 NOTE — Telephone Encounter (Signed)
Pt's husband, Katelyn Lamb (on [name]) called, her condition has changed. She can barely stand some days and other days she can't walk. Would like a call from the nurse.

## 2021-05-11 ENCOUNTER — Other Ambulatory Visit: Payer: Self-pay | Admitting: Neurology

## 2021-05-31 ENCOUNTER — Other Ambulatory Visit: Payer: Self-pay | Admitting: Obstetrics and Gynecology

## 2021-05-31 DIAGNOSIS — N6459 Other signs and symptoms in breast: Secondary | ICD-10-CM | POA: Diagnosis not present

## 2021-05-31 DIAGNOSIS — N644 Mastodynia: Secondary | ICD-10-CM

## 2021-06-02 DIAGNOSIS — M810 Age-related osteoporosis without current pathological fracture: Secondary | ICD-10-CM | POA: Diagnosis not present

## 2021-06-02 DIAGNOSIS — G2581 Restless legs syndrome: Secondary | ICD-10-CM | POA: Diagnosis not present

## 2021-06-02 DIAGNOSIS — Z87891 Personal history of nicotine dependence: Secondary | ICD-10-CM | POA: Diagnosis not present

## 2021-06-02 DIAGNOSIS — G8222 Paraplegia, incomplete: Secondary | ICD-10-CM | POA: Diagnosis not present

## 2021-06-02 DIAGNOSIS — M4802 Spinal stenosis, cervical region: Secondary | ICD-10-CM | POA: Diagnosis not present

## 2021-06-02 DIAGNOSIS — E7800 Pure hypercholesterolemia, unspecified: Secondary | ICD-10-CM | POA: Diagnosis not present

## 2021-06-02 DIAGNOSIS — I82502 Chronic embolism and thrombosis of unspecified deep veins of left lower extremity: Secondary | ICD-10-CM | POA: Diagnosis not present

## 2021-06-02 DIAGNOSIS — M47812 Spondylosis without myelopathy or radiculopathy, cervical region: Secondary | ICD-10-CM | POA: Diagnosis not present

## 2021-06-02 DIAGNOSIS — Z7952 Long term (current) use of systemic steroids: Secondary | ICD-10-CM | POA: Diagnosis not present

## 2021-06-02 DIAGNOSIS — M170 Bilateral primary osteoarthritis of knee: Secondary | ICD-10-CM | POA: Diagnosis not present

## 2021-06-02 DIAGNOSIS — N319 Neuromuscular dysfunction of bladder, unspecified: Secondary | ICD-10-CM | POA: Diagnosis not present

## 2021-06-02 DIAGNOSIS — Z9181 History of falling: Secondary | ICD-10-CM | POA: Diagnosis not present

## 2021-06-02 DIAGNOSIS — D696 Thrombocytopenia, unspecified: Secondary | ICD-10-CM | POA: Diagnosis not present

## 2021-06-02 DIAGNOSIS — Z9350 Unspecified cystostomy status: Secondary | ICD-10-CM | POA: Diagnosis not present

## 2021-06-02 DIAGNOSIS — Z87440 Personal history of urinary (tract) infections: Secondary | ICD-10-CM | POA: Diagnosis not present

## 2021-06-02 DIAGNOSIS — K219 Gastro-esophageal reflux disease without esophagitis: Secondary | ICD-10-CM | POA: Diagnosis not present

## 2021-06-02 DIAGNOSIS — F5101 Primary insomnia: Secondary | ICD-10-CM | POA: Diagnosis not present

## 2021-06-02 DIAGNOSIS — M792 Neuralgia and neuritis, unspecified: Secondary | ICD-10-CM | POA: Diagnosis not present

## 2021-06-02 DIAGNOSIS — F411 Generalized anxiety disorder: Secondary | ICD-10-CM | POA: Diagnosis not present

## 2021-06-02 DIAGNOSIS — Z86711 Personal history of pulmonary embolism: Secondary | ICD-10-CM | POA: Diagnosis not present

## 2021-06-02 DIAGNOSIS — Z7901 Long term (current) use of anticoagulants: Secondary | ICD-10-CM | POA: Diagnosis not present

## 2021-06-02 DIAGNOSIS — M47814 Spondylosis without myelopathy or radiculopathy, thoracic region: Secondary | ICD-10-CM | POA: Diagnosis not present

## 2021-06-02 DIAGNOSIS — F331 Major depressive disorder, recurrent, moderate: Secondary | ICD-10-CM | POA: Diagnosis not present

## 2021-06-02 DIAGNOSIS — G8929 Other chronic pain: Secondary | ICD-10-CM | POA: Diagnosis not present

## 2021-06-06 DIAGNOSIS — G2581 Restless legs syndrome: Secondary | ICD-10-CM | POA: Diagnosis not present

## 2021-06-06 DIAGNOSIS — F331 Major depressive disorder, recurrent, moderate: Secondary | ICD-10-CM | POA: Diagnosis not present

## 2021-06-06 DIAGNOSIS — M4802 Spinal stenosis, cervical region: Secondary | ICD-10-CM | POA: Diagnosis not present

## 2021-06-06 DIAGNOSIS — K219 Gastro-esophageal reflux disease without esophagitis: Secondary | ICD-10-CM | POA: Diagnosis not present

## 2021-06-06 DIAGNOSIS — M792 Neuralgia and neuritis, unspecified: Secondary | ICD-10-CM | POA: Diagnosis not present

## 2021-06-06 DIAGNOSIS — M47814 Spondylosis without myelopathy or radiculopathy, thoracic region: Secondary | ICD-10-CM | POA: Diagnosis not present

## 2021-06-06 DIAGNOSIS — F5101 Primary insomnia: Secondary | ICD-10-CM | POA: Diagnosis not present

## 2021-06-06 DIAGNOSIS — Z7901 Long term (current) use of anticoagulants: Secondary | ICD-10-CM | POA: Diagnosis not present

## 2021-06-06 DIAGNOSIS — M170 Bilateral primary osteoarthritis of knee: Secondary | ICD-10-CM | POA: Diagnosis not present

## 2021-06-06 DIAGNOSIS — Z9350 Unspecified cystostomy status: Secondary | ICD-10-CM | POA: Diagnosis not present

## 2021-06-06 DIAGNOSIS — D696 Thrombocytopenia, unspecified: Secondary | ICD-10-CM | POA: Diagnosis not present

## 2021-06-06 DIAGNOSIS — G8222 Paraplegia, incomplete: Secondary | ICD-10-CM | POA: Diagnosis not present

## 2021-06-06 DIAGNOSIS — M47812 Spondylosis without myelopathy or radiculopathy, cervical region: Secondary | ICD-10-CM | POA: Diagnosis not present

## 2021-06-06 DIAGNOSIS — Z9181 History of falling: Secondary | ICD-10-CM | POA: Diagnosis not present

## 2021-06-06 DIAGNOSIS — N319 Neuromuscular dysfunction of bladder, unspecified: Secondary | ICD-10-CM | POA: Diagnosis not present

## 2021-06-06 DIAGNOSIS — I82502 Chronic embolism and thrombosis of unspecified deep veins of left lower extremity: Secondary | ICD-10-CM | POA: Diagnosis not present

## 2021-06-06 DIAGNOSIS — Z86711 Personal history of pulmonary embolism: Secondary | ICD-10-CM | POA: Diagnosis not present

## 2021-06-06 DIAGNOSIS — Z87440 Personal history of urinary (tract) infections: Secondary | ICD-10-CM | POA: Diagnosis not present

## 2021-06-06 DIAGNOSIS — Z7952 Long term (current) use of systemic steroids: Secondary | ICD-10-CM | POA: Diagnosis not present

## 2021-06-06 DIAGNOSIS — G8929 Other chronic pain: Secondary | ICD-10-CM | POA: Diagnosis not present

## 2021-06-06 DIAGNOSIS — E7800 Pure hypercholesterolemia, unspecified: Secondary | ICD-10-CM | POA: Diagnosis not present

## 2021-06-06 DIAGNOSIS — F411 Generalized anxiety disorder: Secondary | ICD-10-CM | POA: Diagnosis not present

## 2021-06-06 DIAGNOSIS — Z87891 Personal history of nicotine dependence: Secondary | ICD-10-CM | POA: Diagnosis not present

## 2021-06-06 DIAGNOSIS — M810 Age-related osteoporosis without current pathological fracture: Secondary | ICD-10-CM | POA: Diagnosis not present

## 2021-06-08 DIAGNOSIS — M4310 Spondylolisthesis, site unspecified: Secondary | ICD-10-CM | POA: Diagnosis not present

## 2021-06-08 DIAGNOSIS — M5136 Other intervertebral disc degeneration, lumbar region: Secondary | ICD-10-CM | POA: Diagnosis not present

## 2021-06-09 ENCOUNTER — Telehealth: Payer: Self-pay | Admitting: Neurology

## 2021-06-09 NOTE — Telephone Encounter (Signed)
I spoke to her husband Annie Main.  He reports that she has been getting weaker this week.  She does not have a fever.  He does not think she has a urinary tract infection (she does have an indwelling catheter and is going to be seeing urology on Monday).  He asked me to get a message to you that she is doing worse.  I recommended that they consider taking her to the emergency room to make sure that there is not a medical derangement causing her worsening symptoms.

## 2021-06-10 MED ORDER — PREDNISONE 10 MG PO TABS: ORAL_TABLET | ORAL | 0 refills | Status: DC

## 2021-06-10 NOTE — Addendum Note (Signed)
Addended by: [name] on: 06/10/2021 04:08 PM   Modules accepted: Orders

## 2021-06-10 NOTE — Telephone Encounter (Signed)
I called and I talk with husband.  The patient once again is having an episode of poor functioning, more weak and unstable, slightly confused.  No fevers, no clear evidence of bladder infection but they will be seeing the urology physician in 2 days.  In the past she has responded to a brief prednisone taper, I will send this in.  If she does not improve, she may need further blood work and scanning procedures.  She fell about 5 days ago, did not bump her head and no significant injury.  The patient is however is slightly confused.

## 2021-06-12 DIAGNOSIS — Z4803 Encounter for change or removal of drains: Secondary | ICD-10-CM | POA: Diagnosis not present

## 2021-06-12 DIAGNOSIS — Z9359 Other cystostomy status: Secondary | ICD-10-CM | POA: Diagnosis not present

## 2021-06-12 DIAGNOSIS — R339 Retention of urine, unspecified: Secondary | ICD-10-CM | POA: Diagnosis not present

## 2021-06-14 ENCOUNTER — Other Ambulatory Visit: Payer: PPO

## 2021-06-16 DIAGNOSIS — M47814 Spondylosis without myelopathy or radiculopathy, thoracic region: Secondary | ICD-10-CM | POA: Diagnosis not present

## 2021-06-16 DIAGNOSIS — G8929 Other chronic pain: Secondary | ICD-10-CM | POA: Diagnosis not present

## 2021-06-16 DIAGNOSIS — G8222 Paraplegia, incomplete: Secondary | ICD-10-CM | POA: Diagnosis not present

## 2021-06-16 DIAGNOSIS — F411 Generalized anxiety disorder: Secondary | ICD-10-CM | POA: Diagnosis not present

## 2021-06-16 DIAGNOSIS — M792 Neuralgia and neuritis, unspecified: Secondary | ICD-10-CM | POA: Diagnosis not present

## 2021-06-16 DIAGNOSIS — M810 Age-related osteoporosis without current pathological fracture: Secondary | ICD-10-CM | POA: Diagnosis not present

## 2021-06-16 DIAGNOSIS — M47812 Spondylosis without myelopathy or radiculopathy, cervical region: Secondary | ICD-10-CM | POA: Diagnosis not present

## 2021-06-16 DIAGNOSIS — M170 Bilateral primary osteoarthritis of knee: Secondary | ICD-10-CM | POA: Diagnosis not present

## 2021-06-16 DIAGNOSIS — M4802 Spinal stenosis, cervical region: Secondary | ICD-10-CM | POA: Diagnosis not present

## 2021-06-16 DIAGNOSIS — I82502 Chronic embolism and thrombosis of unspecified deep veins of left lower extremity: Secondary | ICD-10-CM | POA: Diagnosis not present

## 2021-06-16 DIAGNOSIS — G2581 Restless legs syndrome: Secondary | ICD-10-CM | POA: Diagnosis not present

## 2021-06-16 DIAGNOSIS — Z435 Encounter for attention to cystostomy: Secondary | ICD-10-CM | POA: Diagnosis not present

## 2021-06-20 DIAGNOSIS — M47812 Spondylosis without myelopathy or radiculopathy, cervical region: Secondary | ICD-10-CM | POA: Diagnosis not present

## 2021-06-20 DIAGNOSIS — M4802 Spinal stenosis, cervical region: Secondary | ICD-10-CM | POA: Diagnosis not present

## 2021-06-20 DIAGNOSIS — Z435 Encounter for attention to cystostomy: Secondary | ICD-10-CM | POA: Diagnosis not present

## 2021-06-20 DIAGNOSIS — M792 Neuralgia and neuritis, unspecified: Secondary | ICD-10-CM | POA: Diagnosis not present

## 2021-06-20 DIAGNOSIS — I82502 Chronic embolism and thrombosis of unspecified deep veins of left lower extremity: Secondary | ICD-10-CM | POA: Diagnosis not present

## 2021-06-20 DIAGNOSIS — G8222 Paraplegia, incomplete: Secondary | ICD-10-CM | POA: Diagnosis not present

## 2021-06-20 DIAGNOSIS — K219 Gastro-esophageal reflux disease without esophagitis: Secondary | ICD-10-CM | POA: Diagnosis not present

## 2021-06-20 DIAGNOSIS — F411 Generalized anxiety disorder: Secondary | ICD-10-CM | POA: Diagnosis not present

## 2021-06-20 DIAGNOSIS — F331 Major depressive disorder, recurrent, moderate: Secondary | ICD-10-CM | POA: Diagnosis not present

## 2021-06-20 DIAGNOSIS — Z7952 Long term (current) use of systemic steroids: Secondary | ICD-10-CM | POA: Diagnosis not present

## 2021-06-20 DIAGNOSIS — Z87440 Personal history of urinary (tract) infections: Secondary | ICD-10-CM | POA: Diagnosis not present

## 2021-06-20 DIAGNOSIS — M47814 Spondylosis without myelopathy or radiculopathy, thoracic region: Secondary | ICD-10-CM | POA: Diagnosis not present

## 2021-06-20 DIAGNOSIS — N319 Neuromuscular dysfunction of bladder, unspecified: Secondary | ICD-10-CM | POA: Diagnosis not present

## 2021-06-20 DIAGNOSIS — F5101 Primary insomnia: Secondary | ICD-10-CM | POA: Diagnosis not present

## 2021-06-20 DIAGNOSIS — Z9181 History of falling: Secondary | ICD-10-CM | POA: Diagnosis not present

## 2021-06-20 DIAGNOSIS — E7800 Pure hypercholesterolemia, unspecified: Secondary | ICD-10-CM | POA: Diagnosis not present

## 2021-06-20 DIAGNOSIS — M810 Age-related osteoporosis without current pathological fracture: Secondary | ICD-10-CM | POA: Diagnosis not present

## 2021-06-20 DIAGNOSIS — G8929 Other chronic pain: Secondary | ICD-10-CM | POA: Diagnosis not present

## 2021-06-20 DIAGNOSIS — Z87891 Personal history of nicotine dependence: Secondary | ICD-10-CM | POA: Diagnosis not present

## 2021-06-20 DIAGNOSIS — Z7901 Long term (current) use of anticoagulants: Secondary | ICD-10-CM | POA: Diagnosis not present

## 2021-06-20 DIAGNOSIS — M170 Bilateral primary osteoarthritis of knee: Secondary | ICD-10-CM | POA: Diagnosis not present

## 2021-06-20 DIAGNOSIS — Z86711 Personal history of pulmonary embolism: Secondary | ICD-10-CM | POA: Diagnosis not present

## 2021-06-20 DIAGNOSIS — G2581 Restless legs syndrome: Secondary | ICD-10-CM | POA: Diagnosis not present

## 2021-06-27 ENCOUNTER — Other Ambulatory Visit: Payer: Self-pay

## 2021-06-27 ENCOUNTER — Ambulatory Visit
Admission: RE | Admit: 2021-06-27 | Discharge: 2021-06-27 | Disposition: A | Discharge status: Home / Self Care | Payer: PPO | Source: Ambulatory Visit | Attending: Obstetrics and Gynecology | Admitting: Obstetrics and Gynecology

## 2021-06-27 ENCOUNTER — Telehealth: Payer: Self-pay | Admitting: Neurology

## 2021-06-27 ENCOUNTER — Ambulatory Visit: Payer: PPO

## 2021-06-27 DIAGNOSIS — N644 Mastodynia: Secondary | ICD-10-CM

## 2021-06-27 DIAGNOSIS — M792 Neuralgia and neuritis, unspecified: Secondary | ICD-10-CM

## 2021-06-27 DIAGNOSIS — R413 Other amnesia: Secondary | ICD-10-CM

## 2021-06-27 DIAGNOSIS — R269 Unspecified abnormalities of gait and mobility: Secondary | ICD-10-CM

## 2021-06-27 DIAGNOSIS — G9340 Encephalopathy, unspecified: Secondary | ICD-10-CM

## 2021-06-27 DIAGNOSIS — R922 Inconclusive mammogram: Secondary | ICD-10-CM | POA: Diagnosis not present

## 2021-06-27 DIAGNOSIS — G0490 Encephalitis and encephalomyelitis, unspecified: Secondary | ICD-10-CM

## 2021-06-27 MED ORDER — DULOXETINE HCL 60 MG PO CPEP: 60.0000 mg | ORAL_CAPSULE | Freq: Two times a day (BID) | ORAL | 1 refills | Status: DC

## 2021-06-27 NOTE — Telephone Encounter (Signed)
I called the patient and talk with the husband.  Over the last 6 weeks she has had intermittent headaches, the headaches are not every day.  The headaches may be on the front or the back of the head, no other associated focal symptoms.  The patient has had a CT of the brain and August and an MRI of the brain in July.  Neither study showed any acute changes.  We will increase the Cymbalta taking 60 mg twice daily to help the headache and potentially help the leg pain.  She will remain on Keppra and Lyrica as well.  They will call us if the headaches are continuing to get worse.

## 2021-06-27 NOTE — Telephone Encounter (Signed)
Pt's husband would like a call to discuss MRI's being ordered for pt due to headaches and new leg pain. Husband has concerns re: pt's viral infection.

## 2021-06-28 DIAGNOSIS — G8222 Paraplegia, incomplete: Secondary | ICD-10-CM | POA: Diagnosis not present

## 2021-06-28 DIAGNOSIS — Z87891 Personal history of nicotine dependence: Secondary | ICD-10-CM | POA: Diagnosis not present

## 2021-06-28 DIAGNOSIS — F5101 Primary insomnia: Secondary | ICD-10-CM | POA: Diagnosis not present

## 2021-06-28 DIAGNOSIS — F331 Major depressive disorder, recurrent, moderate: Secondary | ICD-10-CM | POA: Diagnosis not present

## 2021-06-28 DIAGNOSIS — M810 Age-related osteoporosis without current pathological fracture: Secondary | ICD-10-CM | POA: Diagnosis not present

## 2021-06-28 DIAGNOSIS — G2581 Restless legs syndrome: Secondary | ICD-10-CM | POA: Diagnosis not present

## 2021-06-28 DIAGNOSIS — Z7901 Long term (current) use of anticoagulants: Secondary | ICD-10-CM | POA: Diagnosis not present

## 2021-06-28 DIAGNOSIS — Z86711 Personal history of pulmonary embolism: Secondary | ICD-10-CM | POA: Diagnosis not present

## 2021-06-28 DIAGNOSIS — Z435 Encounter for attention to cystostomy: Secondary | ICD-10-CM | POA: Diagnosis not present

## 2021-06-28 DIAGNOSIS — M792 Neuralgia and neuritis, unspecified: Secondary | ICD-10-CM | POA: Diagnosis not present

## 2021-06-28 DIAGNOSIS — E7800 Pure hypercholesterolemia, unspecified: Secondary | ICD-10-CM | POA: Diagnosis not present

## 2021-06-28 DIAGNOSIS — Z87440 Personal history of urinary (tract) infections: Secondary | ICD-10-CM | POA: Diagnosis not present

## 2021-06-28 DIAGNOSIS — M4802 Spinal stenosis, cervical region: Secondary | ICD-10-CM | POA: Diagnosis not present

## 2021-06-28 DIAGNOSIS — M47814 Spondylosis without myelopathy or radiculopathy, thoracic region: Secondary | ICD-10-CM | POA: Diagnosis not present

## 2021-06-28 DIAGNOSIS — M47812 Spondylosis without myelopathy or radiculopathy, cervical region: Secondary | ICD-10-CM | POA: Diagnosis not present

## 2021-06-28 DIAGNOSIS — M170 Bilateral primary osteoarthritis of knee: Secondary | ICD-10-CM | POA: Diagnosis not present

## 2021-06-28 DIAGNOSIS — I82502 Chronic embolism and thrombosis of unspecified deep veins of left lower extremity: Secondary | ICD-10-CM | POA: Diagnosis not present

## 2021-06-28 DIAGNOSIS — F411 Generalized anxiety disorder: Secondary | ICD-10-CM | POA: Diagnosis not present

## 2021-06-28 DIAGNOSIS — N319 Neuromuscular dysfunction of bladder, unspecified: Secondary | ICD-10-CM | POA: Diagnosis not present

## 2021-06-28 DIAGNOSIS — K219 Gastro-esophageal reflux disease without esophagitis: Secondary | ICD-10-CM | POA: Diagnosis not present

## 2021-06-28 DIAGNOSIS — G8929 Other chronic pain: Secondary | ICD-10-CM | POA: Diagnosis not present

## 2021-06-28 DIAGNOSIS — Z9181 History of falling: Secondary | ICD-10-CM | POA: Diagnosis not present

## 2021-06-28 DIAGNOSIS — Z7952 Long term (current) use of systemic steroids: Secondary | ICD-10-CM | POA: Diagnosis not present

## 2021-07-04 DIAGNOSIS — G2581 Restless legs syndrome: Secondary | ICD-10-CM | POA: Diagnosis not present

## 2021-07-04 DIAGNOSIS — Z87891 Personal history of nicotine dependence: Secondary | ICD-10-CM | POA: Diagnosis not present

## 2021-07-04 DIAGNOSIS — K219 Gastro-esophageal reflux disease without esophagitis: Secondary | ICD-10-CM | POA: Diagnosis not present

## 2021-07-04 DIAGNOSIS — M47812 Spondylosis without myelopathy or radiculopathy, cervical region: Secondary | ICD-10-CM | POA: Diagnosis not present

## 2021-07-04 DIAGNOSIS — M4802 Spinal stenosis, cervical region: Secondary | ICD-10-CM | POA: Diagnosis not present

## 2021-07-04 DIAGNOSIS — E7800 Pure hypercholesterolemia, unspecified: Secondary | ICD-10-CM | POA: Diagnosis not present

## 2021-07-04 DIAGNOSIS — G8222 Paraplegia, incomplete: Secondary | ICD-10-CM | POA: Diagnosis not present

## 2021-07-04 DIAGNOSIS — N319 Neuromuscular dysfunction of bladder, unspecified: Secondary | ICD-10-CM | POA: Diagnosis not present

## 2021-07-04 DIAGNOSIS — M792 Neuralgia and neuritis, unspecified: Secondary | ICD-10-CM | POA: Diagnosis not present

## 2021-07-04 DIAGNOSIS — G8929 Other chronic pain: Secondary | ICD-10-CM | POA: Diagnosis not present

## 2021-07-04 DIAGNOSIS — Z9181 History of falling: Secondary | ICD-10-CM | POA: Diagnosis not present

## 2021-07-04 DIAGNOSIS — F5101 Primary insomnia: Secondary | ICD-10-CM | POA: Diagnosis not present

## 2021-07-04 DIAGNOSIS — Z7901 Long term (current) use of anticoagulants: Secondary | ICD-10-CM | POA: Diagnosis not present

## 2021-07-04 DIAGNOSIS — Z435 Encounter for attention to cystostomy: Secondary | ICD-10-CM | POA: Diagnosis not present

## 2021-07-04 DIAGNOSIS — M810 Age-related osteoporosis without current pathological fracture: Secondary | ICD-10-CM | POA: Diagnosis not present

## 2021-07-04 DIAGNOSIS — F411 Generalized anxiety disorder: Secondary | ICD-10-CM | POA: Diagnosis not present

## 2021-07-04 DIAGNOSIS — F331 Major depressive disorder, recurrent, moderate: Secondary | ICD-10-CM | POA: Diagnosis not present

## 2021-07-04 DIAGNOSIS — I82502 Chronic embolism and thrombosis of unspecified deep veins of left lower extremity: Secondary | ICD-10-CM | POA: Diagnosis not present

## 2021-07-04 DIAGNOSIS — Z87440 Personal history of urinary (tract) infections: Secondary | ICD-10-CM | POA: Diagnosis not present

## 2021-07-04 DIAGNOSIS — M47814 Spondylosis without myelopathy or radiculopathy, thoracic region: Secondary | ICD-10-CM | POA: Diagnosis not present

## 2021-07-04 DIAGNOSIS — M170 Bilateral primary osteoarthritis of knee: Secondary | ICD-10-CM | POA: Diagnosis not present

## 2021-07-04 DIAGNOSIS — Z86711 Personal history of pulmonary embolism: Secondary | ICD-10-CM | POA: Diagnosis not present

## 2021-07-04 DIAGNOSIS — Z7952 Long term (current) use of systemic steroids: Secondary | ICD-10-CM | POA: Diagnosis not present

## 2021-07-07 DIAGNOSIS — M792 Neuralgia and neuritis, unspecified: Secondary | ICD-10-CM | POA: Diagnosis not present

## 2021-07-07 DIAGNOSIS — M4802 Spinal stenosis, cervical region: Secondary | ICD-10-CM | POA: Diagnosis not present

## 2021-07-07 DIAGNOSIS — G2581 Restless legs syndrome: Secondary | ICD-10-CM | POA: Diagnosis not present

## 2021-07-07 DIAGNOSIS — Z87891 Personal history of nicotine dependence: Secondary | ICD-10-CM | POA: Diagnosis not present

## 2021-07-07 DIAGNOSIS — Z9181 History of falling: Secondary | ICD-10-CM | POA: Diagnosis not present

## 2021-07-07 DIAGNOSIS — F5101 Primary insomnia: Secondary | ICD-10-CM | POA: Diagnosis not present

## 2021-07-07 DIAGNOSIS — Z87440 Personal history of urinary (tract) infections: Secondary | ICD-10-CM | POA: Diagnosis not present

## 2021-07-07 DIAGNOSIS — G8222 Paraplegia, incomplete: Secondary | ICD-10-CM | POA: Diagnosis not present

## 2021-07-07 DIAGNOSIS — E7800 Pure hypercholesterolemia, unspecified: Secondary | ICD-10-CM | POA: Diagnosis not present

## 2021-07-07 DIAGNOSIS — M170 Bilateral primary osteoarthritis of knee: Secondary | ICD-10-CM | POA: Diagnosis not present

## 2021-07-07 DIAGNOSIS — N319 Neuromuscular dysfunction of bladder, unspecified: Secondary | ICD-10-CM | POA: Diagnosis not present

## 2021-07-07 DIAGNOSIS — M47812 Spondylosis without myelopathy or radiculopathy, cervical region: Secondary | ICD-10-CM | POA: Diagnosis not present

## 2021-07-07 DIAGNOSIS — M47814 Spondylosis without myelopathy or radiculopathy, thoracic region: Secondary | ICD-10-CM | POA: Diagnosis not present

## 2021-07-07 DIAGNOSIS — F331 Major depressive disorder, recurrent, moderate: Secondary | ICD-10-CM | POA: Diagnosis not present

## 2021-07-07 DIAGNOSIS — Z7901 Long term (current) use of anticoagulants: Secondary | ICD-10-CM | POA: Diagnosis not present

## 2021-07-07 DIAGNOSIS — Z435 Encounter for attention to cystostomy: Secondary | ICD-10-CM | POA: Diagnosis not present

## 2021-07-07 DIAGNOSIS — I82502 Chronic embolism and thrombosis of unspecified deep veins of left lower extremity: Secondary | ICD-10-CM | POA: Diagnosis not present

## 2021-07-07 DIAGNOSIS — Z7952 Long term (current) use of systemic steroids: Secondary | ICD-10-CM | POA: Diagnosis not present

## 2021-07-07 DIAGNOSIS — Z86711 Personal history of pulmonary embolism: Secondary | ICD-10-CM | POA: Diagnosis not present

## 2021-07-07 DIAGNOSIS — F411 Generalized anxiety disorder: Secondary | ICD-10-CM | POA: Diagnosis not present

## 2021-07-07 DIAGNOSIS — K219 Gastro-esophageal reflux disease without esophagitis: Secondary | ICD-10-CM | POA: Diagnosis not present

## 2021-07-07 DIAGNOSIS — G8929 Other chronic pain: Secondary | ICD-10-CM | POA: Diagnosis not present

## 2021-07-07 DIAGNOSIS — M810 Age-related osteoporosis without current pathological fracture: Secondary | ICD-10-CM | POA: Diagnosis not present

## 2021-07-13 DIAGNOSIS — G8222 Paraplegia, incomplete: Secondary | ICD-10-CM | POA: Diagnosis not present

## 2021-07-13 DIAGNOSIS — Z86711 Personal history of pulmonary embolism: Secondary | ICD-10-CM | POA: Diagnosis not present

## 2021-07-13 DIAGNOSIS — M4802 Spinal stenosis, cervical region: Secondary | ICD-10-CM | POA: Diagnosis not present

## 2021-07-13 DIAGNOSIS — Z435 Encounter for attention to cystostomy: Secondary | ICD-10-CM | POA: Diagnosis not present

## 2021-07-13 DIAGNOSIS — F411 Generalized anxiety disorder: Secondary | ICD-10-CM | POA: Diagnosis not present

## 2021-07-13 DIAGNOSIS — M170 Bilateral primary osteoarthritis of knee: Secondary | ICD-10-CM | POA: Diagnosis not present

## 2021-07-13 DIAGNOSIS — M792 Neuralgia and neuritis, unspecified: Secondary | ICD-10-CM | POA: Diagnosis not present

## 2021-07-13 DIAGNOSIS — Z7952 Long term (current) use of systemic steroids: Secondary | ICD-10-CM | POA: Diagnosis not present

## 2021-07-13 DIAGNOSIS — N319 Neuromuscular dysfunction of bladder, unspecified: Secondary | ICD-10-CM | POA: Diagnosis not present

## 2021-07-13 DIAGNOSIS — F5101 Primary insomnia: Secondary | ICD-10-CM | POA: Diagnosis not present

## 2021-07-13 DIAGNOSIS — K219 Gastro-esophageal reflux disease without esophagitis: Secondary | ICD-10-CM | POA: Diagnosis not present

## 2021-07-13 DIAGNOSIS — E7800 Pure hypercholesterolemia, unspecified: Secondary | ICD-10-CM | POA: Diagnosis not present

## 2021-07-13 DIAGNOSIS — M810 Age-related osteoporosis without current pathological fracture: Secondary | ICD-10-CM | POA: Diagnosis not present

## 2021-07-13 DIAGNOSIS — F331 Major depressive disorder, recurrent, moderate: Secondary | ICD-10-CM | POA: Diagnosis not present

## 2021-07-13 DIAGNOSIS — I82502 Chronic embolism and thrombosis of unspecified deep veins of left lower extremity: Secondary | ICD-10-CM | POA: Diagnosis not present

## 2021-07-13 DIAGNOSIS — Z87440 Personal history of urinary (tract) infections: Secondary | ICD-10-CM | POA: Diagnosis not present

## 2021-07-13 DIAGNOSIS — M47814 Spondylosis without myelopathy or radiculopathy, thoracic region: Secondary | ICD-10-CM | POA: Diagnosis not present

## 2021-07-13 DIAGNOSIS — Z7901 Long term (current) use of anticoagulants: Secondary | ICD-10-CM | POA: Diagnosis not present

## 2021-07-13 DIAGNOSIS — G2581 Restless legs syndrome: Secondary | ICD-10-CM | POA: Diagnosis not present

## 2021-07-13 DIAGNOSIS — Z9181 History of falling: Secondary | ICD-10-CM | POA: Diagnosis not present

## 2021-07-13 DIAGNOSIS — M47812 Spondylosis without myelopathy or radiculopathy, cervical region: Secondary | ICD-10-CM | POA: Diagnosis not present

## 2021-07-13 DIAGNOSIS — G8929 Other chronic pain: Secondary | ICD-10-CM | POA: Diagnosis not present

## 2021-07-13 DIAGNOSIS — Z87891 Personal history of nicotine dependence: Secondary | ICD-10-CM | POA: Diagnosis not present

## 2021-07-14 ENCOUNTER — Inpatient Hospital Stay (HOSPITAL_COMMUNITY)
Admission: EM | Admit: 2021-07-14 | Discharge: 2021-08-03 | DRG: 673 | Disposition: A | Discharge status: Skilled Nursing Facility | Payer: PPO | Attending: Internal Medicine | Admitting: Internal Medicine

## 2021-07-14 ENCOUNTER — Emergency Department (HOSPITAL_COMMUNITY): Payer: PPO

## 2021-07-14 ENCOUNTER — Encounter (HOSPITAL_COMMUNITY): Payer: Self-pay

## 2021-07-14 ENCOUNTER — Other Ambulatory Visit: Payer: Self-pay

## 2021-07-14 DIAGNOSIS — J189 Pneumonia, unspecified organism: Secondary | ICD-10-CM

## 2021-07-14 DIAGNOSIS — G0490 Encephalitis and encephalomyelitis, unspecified: Secondary | ICD-10-CM

## 2021-07-14 DIAGNOSIS — Z86711 Personal history of pulmonary embolism: Secondary | ICD-10-CM | POA: Diagnosis not present

## 2021-07-14 DIAGNOSIS — R269 Unspecified abnormalities of gait and mobility: Secondary | ICD-10-CM

## 2021-07-14 DIAGNOSIS — A419 Sepsis, unspecified organism: Secondary | ICD-10-CM | POA: Diagnosis not present

## 2021-07-14 DIAGNOSIS — Z87891 Personal history of nicotine dependence: Secondary | ICD-10-CM | POA: Diagnosis not present

## 2021-07-14 DIAGNOSIS — Z66 Do not resuscitate: Secondary | ICD-10-CM | POA: Diagnosis not present

## 2021-07-14 DIAGNOSIS — R7303 Prediabetes: Secondary | ICD-10-CM | POA: Diagnosis not present

## 2021-07-14 DIAGNOSIS — J9601 Acute respiratory failure with hypoxia: Secondary | ICD-10-CM | POA: Diagnosis not present

## 2021-07-14 DIAGNOSIS — F411 Generalized anxiety disorder: Secondary | ICD-10-CM | POA: Diagnosis not present

## 2021-07-14 DIAGNOSIS — N390 Urinary tract infection, site not specified: Secondary | ICD-10-CM | POA: Diagnosis present

## 2021-07-14 DIAGNOSIS — A498 Other bacterial infections of unspecified site: Secondary | ICD-10-CM

## 2021-07-14 DIAGNOSIS — T83510A Infection and inflammatory reaction due to cystostomy catheter, initial encounter: Principal | ICD-10-CM | POA: Diagnosis present

## 2021-07-14 DIAGNOSIS — R040 Epistaxis: Secondary | ICD-10-CM | POA: Diagnosis not present

## 2021-07-14 DIAGNOSIS — Z79899 Other long term (current) drug therapy: Secondary | ICD-10-CM

## 2021-07-14 DIAGNOSIS — Z7952 Long term (current) use of systemic steroids: Secondary | ICD-10-CM | POA: Diagnosis not present

## 2021-07-14 DIAGNOSIS — Z885 Allergy status to narcotic agent status: Secondary | ICD-10-CM

## 2021-07-14 DIAGNOSIS — N3001 Acute cystitis with hematuria: Secondary | ICD-10-CM

## 2021-07-14 DIAGNOSIS — S3992XA Unspecified injury of lower back, initial encounter: Secondary | ICD-10-CM | POA: Diagnosis not present

## 2021-07-14 DIAGNOSIS — R6520 Severe sepsis without septic shock: Secondary | ICD-10-CM | POA: Diagnosis not present

## 2021-07-14 DIAGNOSIS — R262 Difficulty in walking, not elsewhere classified: Secondary | ICD-10-CM | POA: Diagnosis not present

## 2021-07-14 DIAGNOSIS — M47812 Spondylosis without myelopathy or radiculopathy, cervical region: Secondary | ICD-10-CM | POA: Diagnosis not present

## 2021-07-14 DIAGNOSIS — G990 Autonomic neuropathy in diseases classified elsewhere: Secondary | ICD-10-CM | POA: Diagnosis not present

## 2021-07-14 DIAGNOSIS — Z1612 Extended spectrum beta lactamase (ESBL) resistance: Secondary | ICD-10-CM | POA: Diagnosis present

## 2021-07-14 DIAGNOSIS — Z86718 Personal history of other venous thrombosis and embolism: Secondary | ICD-10-CM

## 2021-07-14 DIAGNOSIS — G894 Chronic pain syndrome: Secondary | ICD-10-CM | POA: Diagnosis present

## 2021-07-14 DIAGNOSIS — Z8249 Family history of ischemic heart disease and other diseases of the circulatory system: Secondary | ICD-10-CM

## 2021-07-14 DIAGNOSIS — E871 Hypo-osmolality and hyponatremia: Secondary | ICD-10-CM | POA: Diagnosis not present

## 2021-07-14 DIAGNOSIS — S22089A Unspecified fracture of T11-T12 vertebra, initial encounter for closed fracture: Secondary | ICD-10-CM | POA: Diagnosis not present

## 2021-07-14 DIAGNOSIS — R102 Pelvic and perineal pain: Secondary | ICD-10-CM | POA: Diagnosis not present

## 2021-07-14 DIAGNOSIS — R8271 Bacteriuria: Secondary | ICD-10-CM | POA: Diagnosis not present

## 2021-07-14 DIAGNOSIS — M5127 Other intervertebral disc displacement, lumbosacral region: Secondary | ICD-10-CM | POA: Diagnosis not present

## 2021-07-14 DIAGNOSIS — A4181 Sepsis due to Enterococcus: Secondary | ICD-10-CM | POA: Diagnosis not present

## 2021-07-14 DIAGNOSIS — E876 Hypokalemia: Secondary | ICD-10-CM | POA: Diagnosis not present

## 2021-07-14 DIAGNOSIS — R338 Other retention of urine: Secondary | ICD-10-CM | POA: Diagnosis not present

## 2021-07-14 DIAGNOSIS — Z20822 Contact with and (suspected) exposure to covid-19: Secondary | ICD-10-CM | POA: Diagnosis present

## 2021-07-14 DIAGNOSIS — M533 Sacrococcygeal disorders, not elsewhere classified: Secondary | ICD-10-CM | POA: Diagnosis not present

## 2021-07-14 DIAGNOSIS — R509 Fever, unspecified: Secondary | ICD-10-CM | POA: Diagnosis not present

## 2021-07-14 DIAGNOSIS — G8222 Paraplegia, incomplete: Secondary | ICD-10-CM | POA: Diagnosis present

## 2021-07-14 DIAGNOSIS — B004 Herpesviral encephalitis: Secondary | ICD-10-CM | POA: Diagnosis not present

## 2021-07-14 DIAGNOSIS — R531 Weakness: Secondary | ICD-10-CM

## 2021-07-14 DIAGNOSIS — W19XXXA Unspecified fall, initial encounter: Secondary | ICD-10-CM | POA: Diagnosis present

## 2021-07-14 DIAGNOSIS — D62 Acute posthemorrhagic anemia: Secondary | ICD-10-CM | POA: Diagnosis not present

## 2021-07-14 DIAGNOSIS — R29898 Other symptoms and signs involving the musculoskeletal system: Secondary | ICD-10-CM | POA: Diagnosis not present

## 2021-07-14 DIAGNOSIS — Y846 Urinary catheterization as the cause of abnormal reaction of the patient, or of later complication, without mention of misadventure at the time of the procedure: Secondary | ICD-10-CM | POA: Diagnosis present

## 2021-07-14 DIAGNOSIS — E46 Unspecified protein-calorie malnutrition: Secondary | ICD-10-CM | POA: Diagnosis not present

## 2021-07-14 DIAGNOSIS — R404 Transient alteration of awareness: Secondary | ICD-10-CM | POA: Diagnosis not present

## 2021-07-14 DIAGNOSIS — N319 Neuromuscular dysfunction of bladder, unspecified: Secondary | ICD-10-CM | POA: Diagnosis present

## 2021-07-14 DIAGNOSIS — Z8619 Personal history of other infectious and parasitic diseases: Secondary | ICD-10-CM | POA: Diagnosis not present

## 2021-07-14 DIAGNOSIS — M5125 Other intervertebral disc displacement, thoracolumbar region: Secondary | ICD-10-CM | POA: Diagnosis not present

## 2021-07-14 DIAGNOSIS — Z87440 Personal history of urinary (tract) infections: Secondary | ICD-10-CM | POA: Diagnosis not present

## 2021-07-14 DIAGNOSIS — Z4659 Encounter for fitting and adjustment of other gastrointestinal appliance and device: Secondary | ICD-10-CM

## 2021-07-14 DIAGNOSIS — Z043 Encounter for examination and observation following other accident: Secondary | ICD-10-CM | POA: Diagnosis not present

## 2021-07-14 DIAGNOSIS — D539 Nutritional anemia, unspecified: Secondary | ICD-10-CM | POA: Diagnosis not present

## 2021-07-14 DIAGNOSIS — Z7902 Long term (current) use of antithrombotics/antiplatelets: Secondary | ICD-10-CM | POA: Diagnosis not present

## 2021-07-14 DIAGNOSIS — M545 Low back pain, unspecified: Secondary | ICD-10-CM | POA: Diagnosis not present

## 2021-07-14 DIAGNOSIS — T83511A Infection and inflammatory reaction due to indwelling urethral catheter, initial encounter: Secondary | ICD-10-CM | POA: Diagnosis present

## 2021-07-14 DIAGNOSIS — K5900 Constipation, unspecified: Secondary | ICD-10-CM | POA: Diagnosis not present

## 2021-07-14 DIAGNOSIS — Z4682 Encounter for fitting and adjustment of non-vascular catheter: Secondary | ICD-10-CM | POA: Diagnosis not present

## 2021-07-14 DIAGNOSIS — R770 Abnormality of albumin: Secondary | ICD-10-CM | POA: Diagnosis not present

## 2021-07-14 DIAGNOSIS — I959 Hypotension, unspecified: Secondary | ICD-10-CM | POA: Diagnosis not present

## 2021-07-14 DIAGNOSIS — Z7901 Long term (current) use of anticoagulants: Secondary | ICD-10-CM

## 2021-07-14 DIAGNOSIS — M5126 Other intervertebral disc displacement, lumbar region: Secondary | ICD-10-CM | POA: Diagnosis not present

## 2021-07-14 DIAGNOSIS — Z743 Need for continuous supervision: Secondary | ICD-10-CM | POA: Diagnosis not present

## 2021-07-14 DIAGNOSIS — M792 Neuralgia and neuritis, unspecified: Secondary | ICD-10-CM

## 2021-07-14 DIAGNOSIS — Z515 Encounter for palliative care: Secondary | ICD-10-CM | POA: Diagnosis not present

## 2021-07-14 DIAGNOSIS — R52 Pain, unspecified: Secondary | ICD-10-CM | POA: Diagnosis present

## 2021-07-14 DIAGNOSIS — R0602 Shortness of breath: Secondary | ICD-10-CM | POA: Diagnosis not present

## 2021-07-14 DIAGNOSIS — F329 Major depressive disorder, single episode, unspecified: Secondary | ICD-10-CM | POA: Diagnosis not present

## 2021-07-14 DIAGNOSIS — B941 Sequelae of viral encephalitis: Secondary | ICD-10-CM

## 2021-07-14 DIAGNOSIS — Z880 Allergy status to penicillin: Secondary | ICD-10-CM

## 2021-07-14 DIAGNOSIS — M546 Pain in thoracic spine: Secondary | ICD-10-CM | POA: Diagnosis not present

## 2021-07-14 DIAGNOSIS — M6281 Muscle weakness (generalized): Secondary | ICD-10-CM | POA: Diagnosis not present

## 2021-07-14 DIAGNOSIS — Z1159 Encounter for screening for other viral diseases: Secondary | ICD-10-CM | POA: Diagnosis not present

## 2021-07-14 DIAGNOSIS — S0990XA Unspecified injury of head, initial encounter: Secondary | ICD-10-CM | POA: Diagnosis not present

## 2021-07-14 DIAGNOSIS — G9340 Encephalopathy, unspecified: Secondary | ICD-10-CM

## 2021-07-14 DIAGNOSIS — R413 Other amnesia: Secondary | ICD-10-CM

## 2021-07-14 DIAGNOSIS — K219 Gastro-esophageal reflux disease without esophagitis: Secondary | ICD-10-CM | POA: Diagnosis not present

## 2021-07-14 DIAGNOSIS — M5124 Other intervertebral disc displacement, thoracic region: Secondary | ICD-10-CM | POA: Diagnosis not present

## 2021-07-14 DIAGNOSIS — R69 Illness, unspecified: Secondary | ICD-10-CM | POA: Diagnosis not present

## 2021-07-14 DIAGNOSIS — R29890 Loss of height: Secondary | ICD-10-CM | POA: Diagnosis not present

## 2021-07-14 DIAGNOSIS — M47816 Spondylosis without myelopathy or radiculopathy, lumbar region: Secondary | ICD-10-CM | POA: Diagnosis not present

## 2021-07-14 DIAGNOSIS — R58 Hemorrhage, not elsewhere classified: Secondary | ICD-10-CM | POA: Diagnosis not present

## 2021-07-14 LAB — COMPREHENSIVE METABOLIC PANEL
ALT: 33 U/L (ref 0–44)
AST: 40 U/L (ref 15–41)
Albumin: 3.2 g/dL — ABNORMAL LOW (ref 3.5–5.0)
Alkaline Phosphatase: 100 U/L (ref 38–126)
Anion gap: 10 (ref 5–15)
BUN: 22 mg/dL (ref 8–23)
CO2: 26 mmol/L (ref 22–32)
Calcium: 9 mg/dL (ref 8.9–10.3)
Chloride: 102 mmol/L (ref 98–111)
Creatinine, Ser: 0.86 mg/dL (ref 0.44–1.00)
GFR, Estimated: >60 mL/min (ref >60)
Glucose, Bld: 121 mg/dL — ABNORMAL HIGH (ref 70–99)
Potassium: 4.3 mmol/L (ref 3.5–5.1)
Sodium: 138 mmol/L (ref 135–145)
Total Bilirubin: 0.4 mg/dL (ref 0.3–1.2)
Total Protein: 6.2 g/dL — ABNORMAL LOW (ref 6.5–8.1)

## 2021-07-14 LAB — URINALYSIS, ROUTINE W REFLEX MICROSCOPIC
Bilirubin Urine: NEGATIVE
Bilirubin Urine: NEGATIVE
Glucose, UA: NEGATIVE mg/dL
Glucose, UA: NEGATIVE mg/dL
Hgb urine dipstick: NEGATIVE
Ketones, ur: NEGATIVE mg/dL
Ketones, ur: NEGATIVE mg/dL
Nitrite: NEGATIVE
Nitrite: NEGATIVE
Protein, ur: NEGATIVE mg/dL
Protein, ur: 100 mg/dL — AB
RBC / HPF: >50 RBC/hpf — ABNORMAL HIGH (ref 0–5)
Specific Gravity, Urine: 1.016 (ref 1.005–1.030)
Specific Gravity, Urine: 1.018 (ref 1.005–1.030)
WBC, UA: >50 WBC/hpf — ABNORMAL HIGH (ref 0–5)
WBC, UA: >50 WBC/hpf — ABNORMAL HIGH (ref 0–5)
pH: 6 (ref 5.0–8.0)
pH: 7 (ref 5.0–8.0)

## 2021-07-14 LAB — LIPASE, BLOOD: Lipase: 38 U/L (ref 11–51)

## 2021-07-14 LAB — CBC WITH DIFFERENTIAL/PLATELET
Abs Immature Granulocytes: 0.05 10*3/uL (ref 0.00–0.07)
Basophils Absolute: 0.1 10*3/uL (ref 0.0–0.1)
Basophils Relative: 1 %
Eosinophils Absolute: 0.3 10*3/uL (ref 0.0–0.5)
Eosinophils Relative: 3 %
HCT: 41.7 % (ref 36.0–46.0)
Hemoglobin: 13 g/dL (ref 12.0–15.0)
Immature Granulocytes: 1 %
Lymphocytes Relative: 23 %
Lymphs Abs: 2.3 10*3/uL (ref 0.7–4.0)
MCH: 30.7 pg (ref 26.0–34.0)
MCHC: 31.2 g/dL (ref 30.0–36.0)
MCV: 98.3 fL (ref 80.0–100.0)
Monocytes Absolute: 0.7 10*3/uL (ref 0.1–1.0)
Monocytes Relative: 7 %
Neutro Abs: 6.5 10*3/uL (ref 1.7–7.7)
Neutrophils Relative %: 65 %
Platelets: 230 10*3/uL (ref 150–400)
RBC: 4.24 MIL/uL (ref 3.87–5.11)
RDW: 16.2 % — ABNORMAL HIGH (ref 11.5–15.5)
WBC: 9.9 10*3/uL (ref 4.0–10.5)
nRBC: 0 % (ref 0.0–0.2)

## 2021-07-14 LAB — CBG MONITORING, ED: Glucose-Capillary: 125 mg/dL — ABNORMAL HIGH (ref 70–99)

## 2021-07-14 LAB — TROPONIN I (HIGH SENSITIVITY): Troponin I (High Sensitivity): 5 ng/L (ref <18)

## 2021-07-14 MED ORDER — DOCUSATE SODIUM 100 MG PO CAPS: 200.0000 mg | ORAL_CAPSULE | Freq: Every day | ORAL | Status: DC | PRN

## 2021-07-14 MED ORDER — ACETAMINOPHEN 325 MG PO TABS
650.0000 mg | ORAL_TABLET | Freq: Four times a day (QID) | ORAL | Status: DC | PRN
  Administered 2021-07-22 – 2021-07-27 (×2): 650 mg via ORAL
  Filled 2021-07-14 (×3): qty 2

## 2021-07-14 MED ORDER — FERROUS SULFATE 325 (65 FE) MG PO TABS
325.0000 mg | ORAL_TABLET | Freq: Every morning | ORAL | Status: DC
  Administered 2021-07-15 – 2021-07-17 (×3): 325 mg via ORAL
  Filled 2021-07-14 (×3): qty 1

## 2021-07-14 MED ORDER — MIRABEGRON ER 50 MG PO TB24
50.0000 mg | ORAL_TABLET | Freq: Every day | ORAL | Status: DC
  Administered 2021-07-15 – 2021-07-16 (×3): 50 mg via ORAL
  Filled 2021-07-14 (×4): qty 1

## 2021-07-14 MED ORDER — PANTOPRAZOLE SODIUM 40 MG PO TBEC
40.0000 mg | DELAYED_RELEASE_TABLET | Freq: Every day | ORAL | Status: DC
  Administered 2021-07-15 – 2021-07-16 (×3): 40 mg via ORAL
  Filled 2021-07-14 (×4): qty 1

## 2021-07-14 MED ORDER — QUETIAPINE FUMARATE 100 MG PO TABS
100.0000 mg | ORAL_TABLET | Freq: Every day | ORAL | Status: DC
  Administered 2021-07-15 – 2021-07-16 (×3): 100 mg via ORAL
  Filled 2021-07-14 (×5): qty 1

## 2021-07-14 MED ORDER — LEVETIRACETAM 250 MG PO TABS
250.0000 mg | ORAL_TABLET | Freq: Two times a day (BID) | ORAL | Status: DC
  Administered 2021-07-15 – 2021-07-17 (×6): 250 mg via ORAL
  Filled 2021-07-14 (×9): qty 1

## 2021-07-14 MED ORDER — DULOXETINE HCL 30 MG PO CPEP
30.0000 mg | ORAL_CAPSULE | Freq: Every morning | ORAL | Status: DC
  Administered 2021-07-15 – 2021-07-17 (×3): 30 mg via ORAL
  Filled 2021-07-14 (×5): qty 1

## 2021-07-14 MED ORDER — ONDANSETRON HCL 4 MG/2ML IJ SOLN
4.0000 mg | Freq: Four times a day (QID) | INTRAMUSCULAR | Status: DC | PRN
  Filled 2021-07-14 (×2): qty 2

## 2021-07-14 MED ORDER — PREDNISONE 5 MG PO TABS
5.0000 mg | ORAL_TABLET | Freq: Every day | ORAL | Status: DC
  Administered 2021-07-15 – 2021-07-17 (×3): 5 mg via ORAL
  Filled 2021-07-14 (×5): qty 1

## 2021-07-14 MED ORDER — SODIUM CHLORIDE 0.9 % IV SOLN
1.0000 g | Freq: Three times a day (TID) | INTRAVENOUS | Status: DC
  Administered 2021-07-14 – 2021-07-15 (×2): 1 g via INTRAVENOUS
  Filled 2021-07-14 (×4): qty 1

## 2021-07-14 MED ORDER — RIVAROXABAN 10 MG PO TABS
20.0000 mg | ORAL_TABLET | Freq: Every day | ORAL | Status: DC
  Administered 2021-07-15 – 2021-07-17 (×3): 20 mg via ORAL
  Filled 2021-07-14 (×3): qty 2

## 2021-07-14 MED ORDER — DULOXETINE HCL 60 MG PO CPEP
60.0000 mg | ORAL_CAPSULE | Freq: Every day | ORAL | Status: DC
  Administered 2021-07-15 – 2021-07-16 (×3): 60 mg via ORAL
  Filled 2021-07-14 (×4): qty 1

## 2021-07-14 MED ORDER — ACETAMINOPHEN 650 MG RE SUPP: 650.0000 mg | Freq: Four times a day (QID) | RECTAL | Status: DC | PRN

## 2021-07-14 MED ORDER — OMEGA-3-ACID ETHYL ESTERS 1 G PO CAPS
1.0000 g | ORAL_CAPSULE | Freq: Every morning | ORAL | Status: DC
  Administered 2021-07-15 – 2021-07-17 (×3): 1 g via ORAL
  Filled 2021-07-14 (×3): qty 1

## 2021-07-14 MED ORDER — ACYCLOVIR 400 MG PO TABS
400.0000 mg | ORAL_TABLET | Freq: Two times a day (BID) | ORAL | Status: DC
  Administered 2021-07-15 – 2021-07-17 (×6): 400 mg via ORAL
  Filled 2021-07-14 (×9): qty 1

## 2021-07-14 MED ORDER — ENOXAPARIN SODIUM 40 MG/0.4ML IJ SOSY: 40.0000 mg | PREFILLED_SYRINGE | INTRAMUSCULAR | Status: DC

## 2021-07-14 MED ORDER — HYDROCODONE-ACETAMINOPHEN 10-325 MG PO TABS
1.0000 | ORAL_TABLET | Freq: Four times a day (QID) | ORAL | Status: DC | PRN
  Administered 2021-07-19 – 2021-08-02 (×7): 1 via ORAL
  Filled 2021-07-14 (×8): qty 1

## 2021-07-14 MED ORDER — DIAZEPAM 5 MG PO TABS
5.0000 mg | ORAL_TABLET | Freq: Two times a day (BID) | ORAL | Status: DC
  Administered 2021-07-15 – 2021-07-17 (×6): 5 mg via ORAL
  Filled 2021-07-14 (×7): qty 1

## 2021-07-14 MED ORDER — ONDANSETRON HCL 4 MG PO TABS: 4.0000 mg | ORAL_TABLET | Freq: Four times a day (QID) | ORAL | Status: DC | PRN

## 2021-07-14 MED ORDER — POLYETHYLENE GLYCOL 3350 17 G PO PACK: 17.0000 g | PACK | Freq: Every day | ORAL | Status: DC | PRN

## 2021-07-14 MED ORDER — PREGABALIN 50 MG PO CAPS
100.0000 mg | ORAL_CAPSULE | Freq: Every day | ORAL | Status: DC
  Administered 2021-07-15 – 2021-07-16 (×3): 100 mg via ORAL
  Filled 2021-07-14 (×4): qty 1

## 2021-07-14 NOTE — ED Provider Notes (Signed)
[facility] EMERGENCY DEPARTMENT Provider Note   CSN: [id number] Arrival date & time:        History No chief complaint on file.   Katelyn Lamb is a 75 y.o. female diagnosed with encephalomalacia 5 years ago presents today via EMS after an acute onset of weakness worsening over the last 2 days.  Patient lives at home with her husband who is normally able to help her ambulate to and from the bathroom but today she was unable to ambulate with his assistance.  Per records, patient with a neurogenic bladder and partial paraplegia.  Per EMS, patient had a fall 2 days ago striking her head.  She is anticoagulated.  Was not evaluated at that time.  Patient reports she has had back pain and buttock pain since that time.    Level 5 caveat for chronic altered mental status.  Await husband for additional history.  Patient has been reports that while at dinner tonight patient seemed altered.  He then noticed that she was sitting on her right hand and was having trouble speaking.  He reports that after calling 911 she attempted to get up and then fell hitting her head.   The history is provided by the patient, the EMS personnel, the spouse and medical records. The history is limited by the condition of the patient. No language interpreter was used.      Past Medical History:  Diagnosis Date   Acute deep vein thrombosis (DVT) of popliteal vein of left lower extremity (HCC)    Anxiety    Back pain    Chronic kidney disease    Encephalomyelitis    Gait abnormality 11/14/2016   Memory difficulty 03/02/2019   Myelitis due to herpes simplex [facility])    Neurogenic bladder     Patient Active Problem List   Diagnosis Date Noted   History of ESBL E. coli infection 07/14/2021   History of ESBL Klebsiella pneumoniae infection 07/14/2021   Generalized weakness 03/15/2021   Acute cystitis with hematuria 03/15/2021   Encephalopathy 03/15/2021   Recurrent falls 03/15/2021   Right  pulmonary embolus ([place]) 02/12/2020   Lower extremity weakness 02/02/2020   Macrocytic anemia 02/02/2020   Spinal stenosis 02/02/2020   History of recurrent UTI (urinary tract infection)    Bacterial infection due to Klebsiella pneumoniae    Laceration of left hand 12/09/2019   DNR (do not resuscitate) 12/09/2019   Pelvic fracture ([place]) 12/06/2019   AKI (acute kidney injury) ([place]) 12/06/2019   Urinary tract infection associated with catheterization of urinary tract, initial encounter ([place]) 07/23/2019   Hematuria 07/23/2019   Hypokalemia 07/23/2019   Acute on chronic anemia 07/23/2019   Fever 07/23/2019   Leukocytosis 07/23/2019   Sepsis ([place]) 07/23/2019   Generalized anxiety disorder 06/17/2019   Menopausal sweats 06/17/2019   Memory difficulty 03/02/2019   Degenerative spondylolisthesis 09/02/2018   Delirium 03/26/2018   Degeneration of lumbar intervertebral disc 11/08/2017   Lumbar radiculopathy 11/06/2017   Chronic neck pain 09/12/2017   Chronic low back pain 09/06/2017   Chronic pain syndrome 09/06/2017   Dilated pancreatic duct 01/24/2017   DVT, lower extremity, distal, chronic ([place]) 01/22/2017   Elevated serum GGT level 01/22/2017   Medication monitoring encounter 01/08/2017   Neurologic gait dysfunction 11/14/2016   Hypotension due to drugs    Urinary retention    Anxiety about health    Reactive depression    Ataxia    Abdominal spasms    Constipation due  to pain medication    Acute lower UTI    Dysuria    Acute deep vein thrombosis (DVT) of popliteal vein of left lower extremity (HCC)    Incomplete paraplegia (HCC)    Acute blood loss anemia    Neurogenic bladder    Neuropathic pain    Muscle spasm    Gastroesophageal reflux disease    Slow transit constipation    Thrombocytopenia (HCC) 10/03/2016   Abnormal MRI, spinal cord    Encephalomyelitis    Numbness    Intractable back pain 09/20/2016   Numbness of left lower extremity 09/20/2016   Hyponatremia  09/20/2016   Herpes zoster without complication 09/62/8366   Spondylosis of cervical region without myelopathy or radiculopathy 06/16/2015    Past Surgical History:  Procedure Laterality Date   ABDOMINAL HYSTERECTOMY     BLADDER REPAIR     CESAREAN SECTION     IR KYPHO LUMBAR INC FX REDUCE BONE BX UNI/BIL CANNULATION INC/IMAGING  04/11/2018   TUBAL LIGATION       OB History   No obstetric history on file.     Family History  Problem Relation Age of Onset   Hypertension Mother     Social History   Tobacco Use   Smoking status: Former   Smokeless tobacco: Never   Tobacco comments:    40 years ago   Vaping Use   Vaping Use: Never used  Substance Use Topics   Alcohol use: No   Drug use: Never    Home Medications Prior to Admission medications   Medication Sig Start Date End Date Taking? Authorizing Provider  acyclovir (ZOVIRAX) 400 MG tablet TAKE 1 TABLET BY MOUTH TWICE A DAY Patient taking differently: Take 400 mg by mouth 2 (two) times daily. 03/27/21  Yes [name], MD  Ascorbic Acid (VITAMIN C) 500 MG CHEW Chew 500 mg by mouth daily.   Yes [provider]  Calcium Carb-Cholecalciferol (CALCIUM 600 + D PO) Take 1 tablet by mouth in the morning and at bedtime.   Yes [provider]  Cyanocobalamin (VITAMIN B-12 PO) Take 1 tablet by mouth daily.   Yes [provider]  diclofenac Sodium (VOLTAREN) 1 % GEL Apply 1 application topically 2 (two) times daily as needed (nerve pain).   Yes [provider]  docusate sodium (COLACE) 100 MG capsule Take 200 mg by mouth daily as needed (constipation).   Yes [provider]  DULoxetine (CYMBALTA) 30 MG capsule Take 30 mg by mouth in the morning. 03/17/21  Yes [provider]  DULoxetine (CYMBALTA) 60 MG capsule Take 1 capsule (60 mg total) by mouth 2 (two) times daily. Patient taking differently: Take 60 mg by mouth at bedtime. 06/27/21  Yes [name], MD  Emollient  (CERAVE) CREA Apply 1 application topically daily as needed (bruise spots).   Yes [provider]  ferrous sulfate 325 (65 FE) MG tablet Take 325 mg by mouth every morning. 03/10/21  Yes [provider]  HYDROcodone-acetaminophen (NORCO) 10-325 MG tablet Take 1 tablet by mouth every 6 (six) hours as needed for pain. 03/07/18  Yes [provider]  levETIRAcetam (KEPPRA) 250 MG tablet TAKE 1 TABLET BY MOUTH TWICE A DAY Patient taking differently: Take 250 mg by mouth 2 (two) times daily. 05/09/21  Yes [name], MD  mirabegron ER (MYRBETRIQ) 50 MG TB24 tablet Take 50 mg by mouth at bedtime.   Yes [provider]  Multiple Vitamins-Minerals (HAIR/SKIN/NAILS/BIOTIN)  TABS Take 1 tablet by mouth every morning.   Yes [provider]  naproxen sodium (ALEVE) 220 MG tablet Take 220 mg by mouth 2 (two) times daily as needed (headache).   Yes [provider]  omega-3 acid ethyl esters (LOVAZA) 1 g capsule Take 1 g by mouth every morning.   Yes [provider]  pantoprazole (PROTONIX) 40 MG tablet Take 1 tablet (40 mg total) by mouth daily. Patient taking differently: Take 40 mg by mouth at bedtime. 10/26/16  Yes Angiulli, Lavon Paganini, PA-C  polyethylene glycol (MIRALAX / GLYCOLAX) packet Take 17 g by mouth daily. Patient taking differently: Take 17 g by mouth daily as needed (constipation). 10/26/16  Yes Angiulli, Lavon Paganini, PA-C  predniSONE (DELTASONE) 5 MG tablet TAKE 1 TABLET BY MOUTH EVERY DAY WITH BREAKFAST Patient taking differently: Take 5 mg by mouth daily with breakfast. 03/27/21  Yes [name], MD  pregabalin (LYRICA) 50 MG capsule Take 2 capsules (100 mg total) by mouth at bedtime. 02/09/21  Yes [name], MD  QUEtiapine (SEROQUEL) 100 MG tablet Take 100 mg by mouth at bedtime. 01/25/21  Yes [provider]  rivaroxaban (XARELTO) 20 MG TABS tablet Take 1 tablet (20 mg total) by mouth daily with supper. Patient taking  differently: Take 20 mg by mouth See admin instructions. Take one tablet (20 mg) by mouth daily with breakfast or lunch 03/06/20  Yes Cherene Altes, MD  diazepam (VALIUM) 5 MG tablet TAKE 1 TABLET (5 MG TOTAL) BY MOUTH IN THE MORNING AND AT BEDTIME. Patient taking differently: Take 5 mg by mouth in the morning and at bedtime. For tremors 02/14/21   [name], MD    Allergies    Demerol [meperidine], Percocet [oxycodone-acetaminophen], Amoxicillin-pot clavulanate, and Penicillins  Review of Systems   Review of Systems  Unable to perform ROS: Other   Physical Exam Updated Vital Signs BP 102/78 (BP Location: Right Arm)   Pulse 87   Temp 98.9 F (37.2 C) (Oral)   Resp 18   SpO2 96%   Physical Exam Vitals and nursing note reviewed.  Constitutional:      General: She is not in acute distress.    Appearance: She is not diaphoretic.  HENT:     Head: Normocephalic.     Comments: No ecchymosis or contusions noted. Eyes:     General: No scleral icterus.    Conjunctiva/sclera: Conjunctivae normal.     Pupils: Pupils are equal, round, and reactive to light.  Neck:     Comments: No midline or paraspinal tenderness to the cervical spine Cardiovascular:     Rate and Rhythm: Normal rate and regular rhythm.     Pulses: Normal pulses.          Radial pulses are 2+ on the right side and 2+ on the left side.  Pulmonary:     Effort: Pulmonary effort is normal. No tachypnea, accessory muscle usage, prolonged expiration, respiratory distress or retractions.     Breath sounds: Normal breath sounds. No stridor.     Comments: Equal chest rise. No increased work of breathing. Abdominal:     General: There is no distension.     Palpations: Abdomen is soft.     Tenderness: There is no abdominal tenderness. There is no guarding or rebound.  Genitourinary:    Comments: Foley catheter in place Musculoskeletal:     Cervical back: Normal range of motion. No tenderness.     Comments: Moves  all extremities equally and without difficulty.  Lymphadenopathy:     Cervical: No cervical adenopathy.  Skin:    General: Skin is warm and dry.     Capillary Refill: Capillary refill takes less than 2 seconds.  Neurological:     Mental Status: She is alert. Mental status is at baseline.     GCS: GCS eye subscore is 4. GCS verbal subscore is 5. GCS motor subscore is 6.     Motor: Weakness (generalized) present.     Comments: Speech is clear and goal oriented. Unable to lift either leg off the bed.  Psychiatric:        Mood and Affect: Mood normal.    ED Results / Procedures / Treatments   Labs (all labs ordered are listed, but only abnormal results are displayed) Labs Reviewed  CBC WITH DIFFERENTIAL/PLATELET - Abnormal; Notable for the following components:      Result Value   RDW 16.2 (*)    All other components within normal limits  COMPREHENSIVE METABOLIC PANEL - Abnormal; Notable for the following components:   Glucose, Bld 121 (*)    Total Protein 6.2 (*)    Albumin 3.2 (*)    All other components within normal limits  URINALYSIS, ROUTINE W REFLEX MICROSCOPIC - Abnormal; Notable for the following components:   Color, Urine AMBER (*)    APPearance CLOUDY (*)    Leukocytes,Ua LARGE (*)    WBC, UA >50 (*)    Bacteria, UA MANY (*)    All other components within normal limits  URINALYSIS, ROUTINE W REFLEX MICROSCOPIC - Abnormal; Notable for the following components:   APPearance CLOUDY (*)    Hgb urine dipstick SMALL (*)    Protein, ur 100 (*)    Leukocytes,Ua LARGE (*)    RBC / HPF >50 (*)    WBC, UA >50 (*)    Bacteria, UA MANY (*)    Non Squamous Epithelial 11-20 (*)    All other components within normal limits  CBG MONITORING, ED - Abnormal; Notable for the following components:   Glucose-Capillary 125 (*)    All other components within normal limits  RESP PANEL BY RT-PCR (FLU A&B, COVID) [id number]  URINE CULTURE  URINE CULTURE  LIPASE, BLOOD  CBC  BASIC  METABOLIC PANEL  TROPONIN I (HIGH SENSITIVITY)  TROPONIN I (HIGH SENSITIVITY)    EKG None  Radiology DG Chest 2 View  Result Date: 07/14/2021 CLINICAL DATA:  Generalized weakness EXAM: CHEST - 2 VIEW COMPARISON:  03/15/2021 FINDINGS: Lungs are well expanded, symmetric, and clear. No pneumothorax or pleural effusion. Cardiac size within normal limits. Pulmonary vascularity is normal. Osseous structures are age-appropriate. No acute bone abnormality. IMPRESSION: No active cardiopulmonary disease. Electronically Signed   By: Fidela Salisbury M.D.   On: 07/14/2021 20:25   DG Thoracic Spine 2 View  Result Date: 07/14/2021 CLINICAL DATA:  Generalized weakness, back pain, recent fall EXAM: THORACIC SPINE 2 VIEWS COMPARISON:  None. FINDINGS: Normal thoracic kyphosis. No acute fracture or listhesis of the thoracic spine. Vertebral body height is preserved. Paraspinal soft tissues are unremarkable. L2 kyphoplasty incidentally noted. Degenerative changes are noted at C3-C6,. IMPRESSION: No acute fracture or listhesis. Electronically Signed   By: Fidela Salisbury M.D.   On: 07/14/2021 20:32   DG Lumbar Spine Complete  Result Date: 07/14/2021 CLINICAL DATA:  Weakness, recent fall, back pain EXAM: LUMBAR SPINE - COMPLETE 4+ VIEW COMPARISON:  12/19/2017 FINDINGS: Normal lumbar lordosis. 7 mm anterolisthesis of L4  upon L5 is unchanged. Interval kyphoplasty of L2 and L5. There are superior endplate fractures of L3 and L4, new since prior examination, with roughly 20-30% loss of height of the L4 vertebral body. These appear chronic in nature. No acute fracture of the lumbar spine. Stable intervertebral disc space narrowing and endplate remodeling at V40-J8 in keeping with changes of a mild to moderate degenerative disc disease. Vascular calcifications are seen within the abdominal aorta. IMPRESSION: No acute fracture or listhesis of the a lumbar spine. Interval vertebral augmentation of L2 and L5. Interval  development of remote appearing fractures of L3 and L4 with approximately 20-30% loss of height of L4. Electronically Signed   By: Fidela Salisbury M.D.   On: 07/14/2021 20:30   DG Sacrum/Coccyx  Result Date: 07/14/2021 CLINICAL DATA:  Generalized weakness, pelvic pain, recent fall EXAM: SACRUM AND COCCYX - 2+ VIEW COMPARISON:  None. FINDINGS: The osseous structures are diffusely osteopenic. No acute fracture or dislocation. L5 kyphoplasty has been performed. L3 and L4 vertebral bodies demonstrate loss of height, not well profiled on this examination. The soft tissues are unremarkable. IMPRESSION: No acute fracture or dislocation. Electronically Signed   By: Fidela Salisbury M.D.   On: 07/14/2021 20:27   CT HEAD WO CONTRAST (5MM)  Result Date: 07/14/2021 CLINICAL DATA:  Weakness and difficulty walking. Worse today. Chronic pain all over. Fall 2 days ago. EXAM: CT HEAD WITHOUT CONTRAST CT CERVICAL SPINE WITHOUT CONTRAST TECHNIQUE: Multidetector CT imaging of the head and cervical spine was performed following the standard protocol without intravenous contrast. Multiplanar CT image reconstructions of the cervical spine were also generated. COMPARISON:  None. FINDINGS: CT HEAD FINDINGS BRAIN: BRAIN Cerebral ventricle sizes are concordant with the degree of cerebral volume loss. Patchy and confluent areas of decreased attenuation are noted throughout the deep and periventricular white matter of the cerebral hemispheres bilaterally, compatible with chronic microvascular ischemic disease. No evidence of large-territorial acute infarction. No parenchymal hemorrhage. No mass lesion. No extra-axial collection. No mass effect or midline shift. No hydrocephalus. Basilar cisterns are patent. Vascular: No hyperdense vessel. Skull: No acute fracture or focal lesion. Sinuses/Orbits: Partial effusion of the right mastoid air cells. No fluid within the right middle ear. Paranasal sinuses and left mastoid air cells are clear.  Bilateral lens replacement. Otherwise orbits are unremarkable. Other: None. CT CERVICAL SPINE FINDINGS Alignment: Normal. Skull base and vertebrae: Multilevel degenerative changes of the spine. No associated severe osseous neural foraminal or central canal stenosis. No acute fracture. No aggressive appearing focal osseous lesion or focal pathologic process. Soft tissues and spinal canal: No prevertebral fluid or swelling. No visible canal hematoma. Upper chest: Unremarkable. Other: None. IMPRESSION: 1. No acute intracranial abnormality. 2. No acute displaced fracture or traumatic listhesis of the cervical spine. 3. Partial fusion of the right mastoid air cells. Electronically Signed   By: Iven Finn M.D.   On: 07/14/2021 21:52   CT Cervical Spine Wo Contrast  Result Date: 07/14/2021 CLINICAL DATA:  Weakness and difficulty walking. Worse today. Chronic pain all over. Fall 2 days ago. EXAM: CT HEAD WITHOUT CONTRAST CT CERVICAL SPINE WITHOUT CONTRAST TECHNIQUE: Multidetector CT imaging of the head and cervical spine was performed following the standard protocol without intravenous contrast. Multiplanar CT image reconstructions of the cervical spine were also generated. COMPARISON:  None. FINDINGS: CT HEAD FINDINGS BRAIN: BRAIN Cerebral ventricle sizes are concordant with the degree of cerebral volume loss. Patchy and confluent areas of decreased attenuation are noted throughout the deep  and periventricular white matter of the cerebral hemispheres bilaterally, compatible with chronic microvascular ischemic disease. No evidence of large-territorial acute infarction. No parenchymal hemorrhage. No mass lesion. No extra-axial collection. No mass effect or midline shift. No hydrocephalus. Basilar cisterns are patent. Vascular: No hyperdense vessel. Skull: No acute fracture or focal lesion. Sinuses/Orbits: Partial effusion of the right mastoid air cells. No fluid within the right middle ear. Paranasal sinuses and  left mastoid air cells are clear. Bilateral lens replacement. Otherwise orbits are unremarkable. Other: None. CT CERVICAL SPINE FINDINGS Alignment: Normal. Skull base and vertebrae: Multilevel degenerative changes of the spine. No associated severe osseous neural foraminal or central canal stenosis. No acute fracture. No aggressive appearing focal osseous lesion or focal pathologic process. Soft tissues and spinal canal: No prevertebral fluid or swelling. No visible canal hematoma. Upper chest: Unremarkable. Other: None. IMPRESSION: 1. No acute intracranial abnormality. 2. No acute displaced fracture or traumatic listhesis of the cervical spine. 3. Partial fusion of the right mastoid air cells. Electronically Signed   By: Iven Finn M.D.   On: 07/14/2021 21:52    Procedures BLADDER CATHETERIZATION  Date/Time: 07/15/2021 12:33 AM Performed by: Abigail Butts, PA-C Authorized by: Abigail Butts, PA-C   Consent:    Consent obtained:  Verbal   Consent given by:  Patient and spouse   Risks, benefits, and alternatives were discussed: yes     Risks discussed:  False passage, infection, pain and incomplete procedure   Alternatives discussed:  No treatment Universal protocol:    Procedure explained and questions answered to patient or proxy's satisfaction: yes     Relevant documents present and verified: yes     Test results available: yes     Imaging studies available: yes     Required blood products, implants, devices, and special equipment available: yes     Site/side marked: yes     Immediately prior to procedure, a time out was called: yes     Patient identity confirmed:  Verbally with patient and arm band Pre-procedure details:    Procedure purpose:  Therapeutic   Preparation: Patient was prepped and draped in usual sterile fashion   Anesthesia:    Anesthesia method:  None Procedure details:    Catheter insertion:  Indwelling   Catheter type:  Coude   Catheter size:  20  Fr   Bladder irrigation: no     Number of attempts:  1   Urine characteristics:  Cloudy and foul smelling Post-procedure details:    Procedure completion:  Tolerated Comments:     Placement of suprapubic urinary catheter without complication.   Medications Ordered in ED Medications  meropenem (MERREM) 1 g in sodium chloride 0.9 % 100 mL IVPB (0 g Intravenous Stopped 07/15/21 0030)  acetaminophen (TYLENOL) tablet 650 mg (has no administration in time range)    Or  acetaminophen (TYLENOL) suppository 650 mg (has no administration in time range)  ondansetron (ZOFRAN) tablet 4 mg (has no administration in time range)    Or  ondansetron (ZOFRAN) injection 4 mg (has no administration in time range)  levETIRAcetam (KEPPRA) tablet 250 mg (has no administration in time range)  predniSONE (DELTASONE) tablet 5 mg (has no administration in time range)  pantoprazole (PROTONIX) EC tablet 40 mg (has no administration in time range)  pregabalin (LYRICA) capsule 100 mg (has no administration in time range)  QUEtiapine (SEROQUEL) tablet 100 mg (has no administration in time range)  rivaroxaban (XARELTO) tablet 20 mg (has no administration in time  range)  polyethylene glycol (MIRALAX / GLYCOLAX) packet 17 g (has no administration in time range)  omega-3 acid ethyl esters (LOVAZA) capsule 1 g (has no administration in time range)  DULoxetine (CYMBALTA) DR capsule 30 mg (has no administration in time range)  DULoxetine (CYMBALTA) DR capsule 60 mg (has no administration in time range)  HYDROcodone-acetaminophen (NORCO) 10-325 MG per tablet 1 tablet (has no administration in time range)  ferrous sulfate tablet 325 mg (has no administration in time range)  mirabegron ER (MYRBETRIQ) tablet 50 mg (has no administration in time range)  diazepam (VALIUM) tablet 5 mg (has no administration in time range)  docusate sodium (COLACE) capsule 200 mg (has no administration in time range)  acyclovir (ZOVIRAX) tablet  400 mg (has no administration in time range)    ED Course  I have reviewed the triage vital signs and the nursing notes.  Pertinent labs & imaging results that were available during my care of the patient were reviewed by me and considered in my medical decision making (see chart for details).  Clinical Course as of 07/15/21 0036  Ludwig Clarks Jul 14, 2021  2124 Discussed with parmacy [HM]    Clinical Course User Index [HM] Caylon Saine, Gwenlyn Perking   MDM Rules/Calculators/A&P                           Patient presents via EMS with husband for generalized weakness.  She does have an indwelling catheter.  Work-up generally reassuring.  Initial UA concerning for infection however current suprapubic catheter is approximately 105 days old.  New catheter placed and repeat urine sample sent.  Given previous cultures antibiotics ordered.  No evidence of CVA.  Patient admitted to Dr. Alcario Drought.   Final Clinical Impression(s) / ED Diagnoses Final diagnoses:  Generalized weakness  Urinary tract infection associated with cystostomy catheter, initial encounter [facility])    Rx / [place] Orders ED Discharge Orders     None        Javaria Knapke, Gwenlyn Perking 07/15/21 0036    Noemi Chapel, MD 07/16/21 1511

## 2021-07-14 NOTE — ED Notes (Signed)
Estella Husk (Older Brother) called asking for an update at [phone]

## 2021-07-14 NOTE — H&P (Signed)
History and Physical    [name] PPI:[id number] DOB: Jan 12, 1946 DOA: 07/14/2021  PCP: [name], MD  Patient coming from: Home  I have personally briefly reviewed patient's old medical records in [place]  Chief Complaint: BLE weakness  HPI: Katelyn Lamb is a 75 y.o. 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  Pt has had frequent UTIs since then.  Typically gets BLE weakness with her UTIs.  Patient lives at home with her husband who is normally able to help her ambulate to and from the bathroom but today she was unable to ambulate with his assistance.  These symptoms are consistent with (if a bit more severe than usual) her UTI symptoms.  Of note had admission to our service in July this year for similar symptoms which after a major work up, were felt to be secondary to UTI and improved with UTI treatment.  In Aug UCx grew out > 100k cfu of ESBL e.coli AND > 100k cfu of ESBL K.Pneumo.  Last month pt with worsening weakness.  [facility] UCx grew out > 100k cfu of ESBL K. [place] (on 10/10).  Attempts were made to treat with monurol which worked for a short period before symptoms returned.  Monurol attempted again around mid-month.  Today pt with severe weakness as described above.  No fevers, chills.   ED Course: WBC nl, no SIRS.  UA with large LE, many bacteria > 50 WBC.  Catheter changed, UA repeated, and still looks infected.  UCx ordered, pt started on merrem.  CT head and nec = nothing acute  X ray of T, L spine and sacrum = just a chronic looking L4 fx that's new since 2019 (not seen on MRI L spine in 2021 though)   Review of Systems: As per HPI, otherwise all review of systems negative.  Past Medical History:  Diagnosis Date   Acute deep vein thrombosis (DVT) of popliteal vein of left lower  extremity (HCC)    Anxiety    Back pain    Chronic kidney disease    Encephalomyelitis    Gait abnormality 11/14/2016   Memory difficulty 03/02/2019   Myelitis due to herpes simplex [facility])    Neurogenic bladder     Past Surgical History:  Procedure Laterality Date   ABDOMINAL HYSTERECTOMY     BLADDER REPAIR     CESAREAN SECTION     IR KYPHO LUMBAR INC FX REDUCE BONE BX UNI/BIL CANNULATION INC/IMAGING  04/11/2018   TUBAL LIGATION       reports that she has quit smoking. She has never used smokeless tobacco. She reports that she does not drink alcohol and does not use drugs.  Allergies  Allergen Reactions   Demerol [Meperidine] Other (See Comments)    Hallucinations   Percocet [Oxycodone-Acetaminophen] Itching   Amoxicillin-Pot Clavulanate Diarrhea    Severe pain, headache, intestinal infection   Penicillins Itching and Rash    Has patient had a PCN reaction causing immediate rash, facial/tongue/throat swelling, SOB or lightheadedness with hypotension:  NO Has patient had a PCN reaction causing severe rash involving mucus membranes or skin necrosis: No Has patient had a PCN reaction that required hospitalization: No Has patient had a PCN reaction occurring within the last 10 years: Yes If all of the above answers are "NO", then may proceed with Cephalosporin use.    Family History  Problem Relation Age of Onset   Hypertension Mother      Prior to Admission medications   Medication Sig Start Date End Date Taking? Authorizing Provider  acyclovir (ZOVIRAX) 400 MG tablet TAKE 1 TABLET BY MOUTH TWICE A DAY Patient taking differently: Take 400 mg by mouth 2 (two) times daily. 03/27/21  Yes [name], MD  Ascorbic Acid (VITAMIN C) 500 MG CHEW Chew 500 mg by mouth daily.   Yes [provider]  Calcium Carb-Cholecalciferol (CALCIUM 600 + D PO) Take 1 tablet by mouth in the morning and at bedtime.   Yes [provider]  Cyanocobalamin (VITAMIN B-12 PO) Take  1 tablet by mouth daily.   Yes [provider]  diclofenac Sodium (VOLTAREN) 1 % GEL Apply 1 application topically 2 (two) times daily as needed (nerve pain).   Yes [provider]  docusate sodium (COLACE) 100 MG capsule Take 200 mg by mouth daily as needed (constipation).   Yes [provider]  DULoxetine (CYMBALTA) 30 MG capsule Take 30 mg by mouth in the morning. 03/17/21  Yes [provider]  DULoxetine (CYMBALTA) 60 MG capsule Take 1 capsule (60 mg total) by mouth 2 (two) times daily. Patient taking differently: Take 60 mg by mouth at bedtime. 06/27/21  Yes [name], MD  Emollient (CERAVE) CREA Apply 1 application topically daily as needed (bruise spots).   Yes [provider]  ferrous sulfate 325 (65 FE) MG tablet Take 325 mg by mouth every morning. 03/10/21  Yes [provider]  HYDROcodone-acetaminophen (NORCO) 10-325 MG tablet Take 1 tablet by mouth every 6 (six) hours as needed for pain. 03/07/18  Yes [provider]  levETIRAcetam (KEPPRA) 250 MG tablet TAKE 1 TABLET BY MOUTH TWICE A DAY Patient taking differently: Take 250 mg by mouth 2 (two) times daily. 05/09/21  Yes [name], MD  mirabegron ER (MYRBETRIQ) 50 MG TB24 tablet Take 50 mg by mouth at bedtime.   Yes [provider]  Multiple Vitamins-Minerals (HAIR/SKIN/NAILS/BIOTIN) TABS Take 1 tablet by mouth every morning.   Yes [provider]  naproxen sodium (ALEVE) 220 MG tablet Take 220 mg by mouth 2 (two) times daily as needed (headache).   Yes [provider]  omega-3 acid ethyl esters (LOVAZA) 1 g capsule Take 1 g by mouth every morning.   Yes [provider]  pantoprazole (PROTONIX) 40 MG tablet Take 1 tablet (40 mg total) by mouth daily. Patient taking differently: Take 40 mg by mouth at bedtime. 10/26/16  Yes Angiulli, Lavon Paganini, PA-C  polyethylene glycol (MIRALAX / GLYCOLAX) packet Take 17 g by mouth daily. Patient  taking differently: Take 17 g by mouth daily as needed (constipation). 10/26/16  Yes Angiulli, Lavon Paganini, PA-C  predniSONE (DELTASONE) 5 MG tablet TAKE 1 TABLET BY MOUTH EVERY DAY WITH BREAKFAST Patient taking differently: Take 5 mg by mouth daily with breakfast. 03/27/21  Yes [name], MD  pregabalin (LYRICA) 50 MG capsule Take 2 capsules (100 mg total) by mouth at bedtime. 02/09/21  Yes [name], MD  QUEtiapine (SEROQUEL) 100 MG tablet Take 100 mg by mouth at bedtime. 01/25/21  Yes [provider]  rivaroxaban (XARELTO) 20 MG TABS tablet Take 1 tablet (20 mg total) by mouth daily with supper. Patient taking differently: Take 20 mg by mouth See admin instructions. Take one tablet (20 mg) by mouth daily with breakfast or lunch 03/06/20  Yes Cherene Altes, MD  diazepam (VALIUM) 5 MG tablet TAKE 1 TABLET (5 MG TOTAL) BY MOUTH IN THE MORNING AND AT BEDTIME. Patient taking differently: Take 5 mg by mouth in the morning and at bedtime. For tremors 02/14/21   [name], MD  nitrofurantoin, macrocrystal-monohydrate, (MACROBID) 100 MG capsule Take 1 capsule (100 mg total) by mouth 2 (two) times daily. Patient not taking: No sig reported 04/14/21   Tedd Sias, PA  predniSONE (DELTASONE) 10 MG tablet Begin taking 6 tablets daily, taper by one tablet daily until off the medication. Patient not taking: No sig reported 06/10/21   [name], MD    Physical Exam: Vitals:   07/14/21 2100 07/14/21 2115 07/14/21 2200 07/14/21 2215  BP:  112/83 109/71 117/84  Pulse:  80 77 74  Resp:  (!) 26 (!) 23 (!) 22  Temp:      TempSrc:      SpO2:  98% 93% 96%  Weight: 68 kg       Constitutional: NAD, calm, comfortable Eyes: PERRL, lids and conjunctivae normal ENMT: Mucous membranes are moist. Posterior pharynx clear of any exudate or lesions.Normal dentition.  Neck: normal, supple, no masses, no thyromegaly Respiratory: clear to auscultation bilaterally, no wheezing, no  crackles. Normal respiratory effort. No accessory muscle use.  Cardiovascular: Regular rate and rhythm, no murmurs / rubs / gallops. No extremity edema. 2+ pedal pulses. No carotid bruits.  Abdomen: no tenderness, no masses palpated. No hepatosplenomegaly. Bowel sounds positive.  Musculoskeletal: no clubbing / cyanosis. No joint deformity upper and lower extremities. Good ROM, no contractures. Normal muscle tone.  Skin: no rashes, lesions, ulcers. No induration Neurologic: Unable to lift either leg off of bed. Psychiatric: Normal judgment and insight. Alert and oriented x 3. Normal mood.    Labs on Admission: I have personally reviewed following labs and imaging studies  CBC: Recent Labs  Lab 07/14/21 1900  WBC 9.9  NEUTROABS 6.5  HGB 13.0  HCT 41.7  MCV 98.3  PLT 161   Basic Metabolic Panel: Recent Labs  Lab 07/14/21 1900  NA 138  K 4.3  CL 102  CO2 26  GLUCOSE 121*  BUN 22  CREATININE 0.86  CALCIUM 9.0   GFR: Estimated Creatinine Clearance: 56.7 mL/min (by C-G formula based on SCr of 0.86 mg/dL). Liver Function Tests: Recent Labs  Lab 07/14/21 1900  AST 40  ALT 33  ALKPHOS 100  BILITOT 0.4  PROT 6.2*  ALBUMIN 3.2*   Recent Labs  Lab 07/14/21 1900  LIPASE 38   No results for input(s): AMMONIA in the last 168 hours. Coagulation Profile: No results for input(s): INR, PROTIME in the last 168 hours. Cardiac Enzymes: No results for input(s): CKTOTAL, CKMB, CKMBINDEX, TROPONINI in the last 168 hours. BNP (last 3 results) No results for input(s): PROBNP in the last 8760 hours. HbA1C: No results for input(s): HGBA1C in the last 72 hours. CBG: Recent Labs  Lab 07/14/21 1904  GLUCAP 125*   Lipid Profile: No results for input(s): CHOL, HDL, LDLCALC, TRIG, CHOLHDL, LDLDIRECT in the last 72 hours. Thyroid Function Tests: No results for input(s): TSH, T4TOTAL, FREET4, T3FREE, THYROIDAB in the last 72 hours. Anemia Panel: No results for input(s): VITAMINB12,  FOLATE, FERRITIN, TIBC, IRON, RETICCTPCT in the last 72 hours. Urine analysis:    Component Value Date/Time   COLORURINE AMBER (A) 07/14/2021 1901   APPEARANCEUR CLOUDY (A) 07/14/2021 1901   LABSPEC 1.016 07/14/2021 1901   PHURINE 6.0 07/14/2021 1901   GLUCOSEU  NEGATIVE 07/14/2021 1901   HGBUR NEGATIVE 07/14/2021 1901   BILIRUBINUR NEGATIVE 07/14/2021 1901   KETONESUR NEGATIVE 07/14/2021 1901   PROTEINUR NEGATIVE 07/14/2021 1901   NITRITE NEGATIVE 07/14/2021 1901   LEUKOCYTESUR LARGE (A) 07/14/2021 1901    Radiological Exams on Admission: DG Chest 2 View  Result Date: 07/14/2021 CLINICAL DATA:  Generalized weakness EXAM: CHEST - 2 VIEW COMPARISON:  03/15/2021 FINDINGS: Lungs are well expanded, symmetric, and clear. No pneumothorax or pleural effusion. Cardiac size within normal limits. Pulmonary vascularity is normal. Osseous structures are age-appropriate. No acute bone abnormality. IMPRESSION: No active cardiopulmonary disease. Electronically Signed   By: Fidela Salisbury M.D.   On: 07/14/2021 20:25   DG Thoracic Spine 2 View  Result Date: 07/14/2021 CLINICAL DATA:  Generalized weakness, back pain, recent fall EXAM: THORACIC SPINE 2 VIEWS COMPARISON:  None. FINDINGS: Normal thoracic kyphosis. No acute fracture or listhesis of the thoracic spine. Vertebral body height is preserved. Paraspinal soft tissues are unremarkable. L2 kyphoplasty incidentally noted. Degenerative changes are noted at C3-C6,. IMPRESSION: No acute fracture or listhesis. Electronically Signed   By: Fidela Salisbury M.D.   On: 07/14/2021 20:32   DG Lumbar Spine Complete  Result Date: 07/14/2021 CLINICAL DATA:  Weakness, recent fall, back pain EXAM: LUMBAR SPINE - COMPLETE 4+ VIEW COMPARISON:  12/19/2017 FINDINGS: Normal lumbar lordosis. 7 mm anterolisthesis of L4 upon L5 is unchanged. Interval kyphoplasty of L2 and L5. There are superior endplate fractures of L3 and L4, new since prior examination, with roughly 20-30%  loss of height of the L4 vertebral body. These appear chronic in nature. No acute fracture of the lumbar spine. Stable intervertebral disc space narrowing and endplate remodeling at H41-D4 in keeping with changes of a mild to moderate degenerative disc disease. Vascular calcifications are seen within the abdominal aorta. IMPRESSION: No acute fracture or listhesis of the a lumbar spine. Interval vertebral augmentation of L2 and L5. Interval development of remote appearing fractures of L3 and L4 with approximately 20-30% loss of height of L4. Electronically Signed   By: Fidela Salisbury M.D.   On: 07/14/2021 20:30   DG Sacrum/Coccyx  Result Date: 07/14/2021 CLINICAL DATA:  Generalized weakness, pelvic pain, recent fall EXAM: SACRUM AND COCCYX - 2+ VIEW COMPARISON:  None. FINDINGS: The osseous structures are diffusely osteopenic. No acute fracture or dislocation. L5 kyphoplasty has been performed. L3 and L4 vertebral bodies demonstrate loss of height, not well profiled on this examination. The soft tissues are unremarkable. IMPRESSION: No acute fracture or dislocation. Electronically Signed   By: Fidela Salisbury M.D.   On: 07/14/2021 20:27   CT HEAD WO CONTRAST (5MM)  Result Date: 07/14/2021 CLINICAL DATA:  Weakness and difficulty walking. Worse today. Chronic pain all over. Fall 2 days ago. EXAM: CT HEAD WITHOUT CONTRAST CT CERVICAL SPINE WITHOUT CONTRAST TECHNIQUE: Multidetector CT imaging of the head and cervical spine was performed following the standard protocol without intravenous contrast. Multiplanar CT image reconstructions of the cervical spine were also generated. COMPARISON:  None. FINDINGS: CT HEAD FINDINGS BRAIN: BRAIN Cerebral ventricle sizes are concordant with the degree of cerebral volume loss. Patchy and confluent areas of decreased attenuation are noted throughout the deep and periventricular white matter of the cerebral hemispheres bilaterally, compatible with chronic microvascular ischemic  disease. No evidence of large-territorial acute infarction. No parenchymal hemorrhage. No mass lesion. No extra-axial collection. No mass effect or midline shift. No hydrocephalus. Basilar cisterns are patent. Vascular: No hyperdense vessel. Skull: No acute fracture or focal  lesion. Sinuses/Orbits: Partial effusion of the right mastoid air cells. No fluid within the right middle ear. Paranasal sinuses and left mastoid air cells are clear. Bilateral lens replacement. Otherwise orbits are unremarkable. Other: None. CT CERVICAL SPINE FINDINGS Alignment: Normal. Skull base and vertebrae: Multilevel degenerative changes of the spine. No associated severe osseous neural foraminal or central canal stenosis. No acute fracture. No aggressive appearing focal osseous lesion or focal pathologic process. Soft tissues and spinal canal: No prevertebral fluid or swelling. No visible canal hematoma. Upper chest: Unremarkable. Other: None. IMPRESSION: 1. No acute intracranial abnormality. 2. No acute displaced fracture or traumatic listhesis of the cervical spine. 3. Partial fusion of the right mastoid air cells. Electronically Signed   By: Iven Finn M.D.   On: 07/14/2021 21:52   CT Cervical Spine Wo Contrast  Result Date: 07/14/2021 CLINICAL DATA:  Weakness and difficulty walking. Worse today. Chronic pain all over. Fall 2 days ago. EXAM: CT HEAD WITHOUT CONTRAST CT CERVICAL SPINE WITHOUT CONTRAST TECHNIQUE: Multidetector CT imaging of the head and cervical spine was performed following the standard protocol without intravenous contrast. Multiplanar CT image reconstructions of the cervical spine were also generated. COMPARISON:  None. FINDINGS: CT HEAD FINDINGS BRAIN: BRAIN Cerebral ventricle sizes are concordant with the degree of cerebral volume loss. Patchy and confluent areas of decreased attenuation are noted throughout the deep and periventricular white matter of the cerebral hemispheres bilaterally, compatible with  chronic microvascular ischemic disease. No evidence of large-territorial acute infarction. No parenchymal hemorrhage. No mass lesion. No extra-axial collection. No mass effect or midline shift. No hydrocephalus. Basilar cisterns are patent. Vascular: No hyperdense vessel. Skull: No acute fracture or focal lesion. Sinuses/Orbits: Partial effusion of the right mastoid air cells. No fluid within the right middle ear. Paranasal sinuses and left mastoid air cells are clear. Bilateral lens replacement. Otherwise orbits are unremarkable. Other: None. CT CERVICAL SPINE FINDINGS Alignment: Normal. Skull base and vertebrae: Multilevel degenerative changes of the spine. No associated severe osseous neural foraminal or central canal stenosis. No acute fracture. No aggressive appearing focal osseous lesion or focal pathologic process. Soft tissues and spinal canal: No prevertebral fluid or swelling. No visible canal hematoma. Upper chest: Unremarkable. Other: None. IMPRESSION: 1. No acute intracranial abnormality. 2. No acute displaced fracture or traumatic listhesis of the cervical spine. 3. Partial fusion of the right mastoid air cells. Electronically Signed   By: Iven Finn M.D.   On: 07/14/2021 21:52    EKG: Independently reviewed.  Assessment/Plan Principal Problem:   Urinary tract infection associated with catheterization of urinary tract, initial encounter [facility]) Active Problems:   Incomplete paraplegia (HCC)   Chronic pain syndrome   Generalized weakness   History of ESBL E. coli infection   History of ESBL Klebsiella pneumoniae infection    CAUTI - likely ESBL given HPI above Empiric merrem UCx pending If ESBL confirmed, likely warrants ID consult to see if they have any ideas how to keep her from getting recurrent ESBL CAUTI BLE weakness - incomplete paraplegia Acute on chronic Acute component probably secondary to ESBL UTI given extensive history of similar presentations. However, in  abundance of caution, and due to fact that DG L spine does show a new (though remote appearing) compression fx, will go ahead and get MRI of T and L spine, just-in-case Chronic component is of course due to HSV transverse myelitis history CPS - Cont home meds Cont lyrica Cont keppra (looks like shes taking for chronic pain per Dr.  Willis note, I dont see any mention of seizure disorder) Chronic steroid use - cont prednisone Chronic AC - Cont xarelto  DVT prophylaxis: Xarelto Code Status: Full Family Communication: Husband at bedside Disposition Plan: Home after weakness improved / treatment for likely ESBL UTI Consults called: None Admission status: Place in obs     Rhya Shan, [place] [facility]  How to contact the [facility] Attending or Consulting provider [place] or covering provider during after hours [place], for this patient?  Check the care team in [facility] and look for a) attending/consulting [facility] provider listed and b) the [facility] team listed Log into [email or web address]  Amion Physician Scheduling and messaging for groups and whole hospitals  On call and physician scheduling software for group practices, residents, hospitalists and other medical providers for call, clinic, rotation and shift schedules. On[employer] is a hospital-wide system for scheduling doctors and paging doctors on call. EasyPlot is for scientific plotting and data analysis.  [email or web address]  and use [place]'s universal password to access. If you do not have the password, please contact the hospital operator.  Locate the [facility] provider you are looking for under [facility] and page to a number that you can be directly reached. If you still have difficulty reaching the provider, please page the [facility] (Director on Call) for the Hospitalists listed on amion for assistance.  07/14/2021, 11:26 PM

## 2021-07-14 NOTE — ED Triage Notes (Signed)
Pt bib [facility] from home where she lives with her husband. Pt has had weakness and difficulty walking for 5 years but today it is worse. Pt complains of chronic pain all over. Pt did fall 2 days ago and hit her head but does not have a headache now.  EMS vitals: 110/70, 90 NSR, 16R, 95%RA,134CBG

## 2021-07-15 ENCOUNTER — Observation Stay (HOSPITAL_COMMUNITY): Payer: PPO

## 2021-07-15 DIAGNOSIS — E876 Hypokalemia: Secondary | ICD-10-CM | POA: Diagnosis not present

## 2021-07-15 DIAGNOSIS — R69 Illness, unspecified: Secondary | ICD-10-CM | POA: Diagnosis not present

## 2021-07-15 DIAGNOSIS — N390 Urinary tract infection, site not specified: Secondary | ICD-10-CM | POA: Diagnosis present

## 2021-07-15 DIAGNOSIS — R531 Weakness: Secondary | ICD-10-CM | POA: Diagnosis not present

## 2021-07-15 DIAGNOSIS — Y846 Urinary catheterization as the cause of abnormal reaction of the patient, or of later complication, without mention of misadventure at the time of the procedure: Secondary | ICD-10-CM | POA: Diagnosis present

## 2021-07-15 DIAGNOSIS — Z86718 Personal history of other venous thrombosis and embolism: Secondary | ICD-10-CM | POA: Diagnosis not present

## 2021-07-15 DIAGNOSIS — W19XXXA Unspecified fall, initial encounter: Secondary | ICD-10-CM | POA: Diagnosis present

## 2021-07-15 DIAGNOSIS — Z885 Allergy status to narcotic agent status: Secondary | ICD-10-CM | POA: Diagnosis not present

## 2021-07-15 DIAGNOSIS — M5126 Other intervertebral disc displacement, lumbar region: Secondary | ICD-10-CM | POA: Diagnosis not present

## 2021-07-15 DIAGNOSIS — R6520 Severe sepsis without septic shock: Secondary | ICD-10-CM | POA: Diagnosis not present

## 2021-07-15 DIAGNOSIS — Z66 Do not resuscitate: Secondary | ICD-10-CM | POA: Diagnosis not present

## 2021-07-15 DIAGNOSIS — T83510A Infection and inflammatory reaction due to cystostomy catheter, initial encounter: Secondary | ICD-10-CM | POA: Diagnosis present

## 2021-07-15 DIAGNOSIS — M5124 Other intervertebral disc displacement, thoracic region: Secondary | ICD-10-CM | POA: Diagnosis not present

## 2021-07-15 DIAGNOSIS — G0490 Encephalitis and encephalomyelitis, unspecified: Secondary | ICD-10-CM | POA: Diagnosis not present

## 2021-07-15 DIAGNOSIS — R040 Epistaxis: Secondary | ICD-10-CM | POA: Diagnosis not present

## 2021-07-15 DIAGNOSIS — K5900 Constipation, unspecified: Secondary | ICD-10-CM | POA: Diagnosis not present

## 2021-07-15 DIAGNOSIS — M5125 Other intervertebral disc displacement, thoracolumbar region: Secondary | ICD-10-CM | POA: Diagnosis not present

## 2021-07-15 DIAGNOSIS — Z1612 Extended spectrum beta lactamase (ESBL) resistance: Secondary | ICD-10-CM | POA: Diagnosis present

## 2021-07-15 DIAGNOSIS — Z8619 Personal history of other infectious and parasitic diseases: Secondary | ICD-10-CM | POA: Diagnosis not present

## 2021-07-15 DIAGNOSIS — Z79899 Other long term (current) drug therapy: Secondary | ICD-10-CM | POA: Diagnosis not present

## 2021-07-15 DIAGNOSIS — Z87440 Personal history of urinary (tract) infections: Secondary | ICD-10-CM | POA: Diagnosis not present

## 2021-07-15 DIAGNOSIS — A419 Sepsis, unspecified organism: Secondary | ICD-10-CM | POA: Diagnosis not present

## 2021-07-15 DIAGNOSIS — I959 Hypotension, unspecified: Secondary | ICD-10-CM | POA: Diagnosis not present

## 2021-07-15 DIAGNOSIS — R29898 Other symptoms and signs involving the musculoskeletal system: Secondary | ICD-10-CM | POA: Diagnosis not present

## 2021-07-15 DIAGNOSIS — Z86711 Personal history of pulmonary embolism: Secondary | ICD-10-CM | POA: Diagnosis not present

## 2021-07-15 DIAGNOSIS — A4181 Sepsis due to Enterococcus: Secondary | ICD-10-CM | POA: Diagnosis not present

## 2021-07-15 DIAGNOSIS — T83511A Infection and inflammatory reaction due to indwelling urethral catheter, initial encounter: Secondary | ICD-10-CM

## 2021-07-15 DIAGNOSIS — D62 Acute posthemorrhagic anemia: Secondary | ICD-10-CM | POA: Diagnosis not present

## 2021-07-15 DIAGNOSIS — E871 Hypo-osmolality and hyponatremia: Secondary | ICD-10-CM | POA: Diagnosis not present

## 2021-07-15 DIAGNOSIS — Z7901 Long term (current) use of anticoagulants: Secondary | ICD-10-CM | POA: Diagnosis not present

## 2021-07-15 DIAGNOSIS — Z4682 Encounter for fitting and adjustment of non-vascular catheter: Secondary | ICD-10-CM | POA: Diagnosis not present

## 2021-07-15 DIAGNOSIS — B941 Sequelae of viral encephalitis: Secondary | ICD-10-CM | POA: Diagnosis not present

## 2021-07-15 DIAGNOSIS — N319 Neuromuscular dysfunction of bladder, unspecified: Secondary | ICD-10-CM | POA: Diagnosis present

## 2021-07-15 DIAGNOSIS — S22089A Unspecified fracture of T11-T12 vertebra, initial encounter for closed fracture: Secondary | ICD-10-CM | POA: Diagnosis not present

## 2021-07-15 DIAGNOSIS — R509 Fever, unspecified: Secondary | ICD-10-CM | POA: Diagnosis not present

## 2021-07-15 DIAGNOSIS — G894 Chronic pain syndrome: Secondary | ICD-10-CM | POA: Diagnosis not present

## 2021-07-15 DIAGNOSIS — J9601 Acute respiratory failure with hypoxia: Secondary | ICD-10-CM | POA: Diagnosis not present

## 2021-07-15 DIAGNOSIS — S3992XA Unspecified injury of lower back, initial encounter: Secondary | ICD-10-CM | POA: Diagnosis not present

## 2021-07-15 DIAGNOSIS — R52 Pain, unspecified: Secondary | ICD-10-CM | POA: Diagnosis present

## 2021-07-15 DIAGNOSIS — G8222 Paraplegia, incomplete: Secondary | ICD-10-CM | POA: Diagnosis present

## 2021-07-15 DIAGNOSIS — Z8249 Family history of ischemic heart disease and other diseases of the circulatory system: Secondary | ICD-10-CM | POA: Diagnosis not present

## 2021-07-15 DIAGNOSIS — M5127 Other intervertebral disc displacement, lumbosacral region: Secondary | ICD-10-CM | POA: Diagnosis not present

## 2021-07-15 DIAGNOSIS — R8271 Bacteriuria: Secondary | ICD-10-CM | POA: Diagnosis not present

## 2021-07-15 DIAGNOSIS — Z87891 Personal history of nicotine dependence: Secondary | ICD-10-CM | POA: Diagnosis not present

## 2021-07-15 DIAGNOSIS — Z7902 Long term (current) use of antithrombotics/antiplatelets: Secondary | ICD-10-CM | POA: Diagnosis not present

## 2021-07-15 DIAGNOSIS — M47816 Spondylosis without myelopathy or radiculopathy, lumbar region: Secondary | ICD-10-CM | POA: Diagnosis not present

## 2021-07-15 DIAGNOSIS — R0602 Shortness of breath: Secondary | ICD-10-CM | POA: Diagnosis not present

## 2021-07-15 DIAGNOSIS — Z20822 Contact with and (suspected) exposure to covid-19: Secondary | ICD-10-CM | POA: Diagnosis not present

## 2021-07-15 DIAGNOSIS — Z515 Encounter for palliative care: Secondary | ICD-10-CM | POA: Diagnosis not present

## 2021-07-15 DIAGNOSIS — Z7952 Long term (current) use of systemic steroids: Secondary | ICD-10-CM | POA: Diagnosis not present

## 2021-07-15 LAB — C-REACTIVE PROTEIN: CRP: 0.8 mg/dL (ref <1.0)

## 2021-07-15 LAB — BASIC METABOLIC PANEL
Anion gap: 11 (ref 5–15)
BUN: 18 mg/dL (ref 8–23)
CO2: 27 mmol/L (ref 22–32)
Calcium: 9 mg/dL (ref 8.9–10.3)
Chloride: 103 mmol/L (ref 98–111)
Creatinine, Ser: 0.75 mg/dL (ref 0.44–1.00)
GFR, Estimated: >60 mL/min (ref >60)
Glucose, Bld: 102 mg/dL — ABNORMAL HIGH (ref 70–99)
Potassium: 4 mmol/L (ref 3.5–5.1)
Sodium: 141 mmol/L (ref 135–145)

## 2021-07-15 LAB — CBC
HCT: 39.8 % (ref 36.0–46.0)
Hemoglobin: 12.2 g/dL (ref 12.0–15.0)
MCH: 30.1 pg (ref 26.0–34.0)
MCHC: 30.7 g/dL (ref 30.0–36.0)
MCV: 98.3 fL (ref 80.0–100.0)
Platelets: 237 10*3/uL (ref 150–400)
RBC: 4.05 MIL/uL (ref 3.87–5.11)
RDW: 16.1 % — ABNORMAL HIGH (ref 11.5–15.5)
WBC: 9.3 10*3/uL (ref 4.0–10.5)
nRBC: 0 % (ref 0.0–0.2)

## 2021-07-15 LAB — RESP PANEL BY RT-PCR (FLU A&B, COVID) ARPGX2
Influenza A by PCR: NEGATIVE
Influenza B by PCR: NEGATIVE
SARS Coronavirus 2 by RT PCR: NEGATIVE

## 2021-07-15 LAB — TROPONIN I (HIGH SENSITIVITY): Troponin I (High Sensitivity): 4 ng/L (ref <18)

## 2021-07-15 MED ORDER — GADOBUTROL 1 MMOL/ML IV SOLN
6.0000 mL | Freq: Once | INTRAVENOUS | Status: AC | PRN
  Administered 2021-07-15: 6 mL via INTRAVENOUS

## 2021-07-15 NOTE — Progress Notes (Addendum)
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  Patient does have a history of recurrent UTIs since then typically gets bilateral lower extremity weakness with her UTIs.  Patient lives at home with her husband who is normally able to help her ambulate to and from the bathroom but for last few days she was unable to ambulate with his assistance. Patient has multiple hospitalizations for recurrent UTIs and was treated several times. Labs in the ED shows UA large,  LE many bacteria's.  Catheter was exchanged.  UA still looks infected.  Urine cultures obtained. Patient started on meropenem.  CT head and neck negative for acute abnormality.  Infectious disease consulted recommended to discontinue meropenem.  Assessment & Plan:   Principal Problem:   Urinary tract infection associated with catheterization of urinary tract, initial encounter [facility]) Active Problems:   Incomplete paraplegia (HCC)   Chronic pain syndrome   Generalized weakness   History of ESBL E. coli infection   History of ESBL Klebsiella pneumoniae infection  Catheter associated UTI-likely ESBL. Patient has chronic catheter, history of recurrent UTIs with ESBL. Started empiric meropenem. Follow-up urine cultures. Infectious disease consulted, recommended to discontinue antibiotics. If She develops sepsis  repeat blood cultures and restart meropenem/ vancomycin while waiting for cultures.  Bilateral lower extremity weakness : Incomplete paraplegia due to transverse myelitis. Acute on chronic.  This could be secondary to ESBL UTI. Patient also has remote appearing compression fractures in thoracic and lumbar spine. MRI T-spine: healed T12 compression fracture   MRI L-spine: Multiple remote healed compression fractures  PT and OT eval.  Chronic pain syndrome: Continue Lyrica, Keppra, prednisone  History of DVT Continue Xarelto    DVT prophylaxis: Xarelto Code Status: Full code Family Communication: Husband and son at bedside Disposition Plan:   Status is: Observation  The patient remains OBS appropriate and will d/c before 2 midnights.  CAUTI.  Requiring infectious disease consultation.  Anticipated discharge home in 1 to 2 days.   Consultants:  Infectious disease  Procedures: None Antimicrobials:   Anti-infectives (From admission, onward)    Start     Dose/Rate Route Frequency Ordered Stop   07/15/21 0000  acyclovir (ZOVIRAX) tablet 400 mg        400 mg Oral 2 times daily 07/14/21 2351     07/14/21 2200  meropenem (MERREM) 1 g in sodium chloride 0.9 % 100 mL IVPB  Status:  Discontinued        1 g 200 mL/hr over 30 Minutes Intravenous Every 8 hours 07/14/21 2143 07/15/21 1251        Subjective: Patient was seen and examined at bedside.  Overnight events noted.   Patient reports having lower back pain, still reports having burning urination.  Objective: Vitals:   07/15/21 1145 07/15/21 1230 07/15/21 1315 07/15/21 1400  BP: 108/69 109/83 121/88 110/67  Pulse: 89 90 96 95  Resp: (!) 26 (!) 23 (!) 27 (!) 22  Temp:      TempSrc:      SpO2: 92% 93% 92% 92%  Weight:        Intake/Output Summary (Last 24 hours) at 07/15/2021 1429 Last data filed at 07/15/2021 0916 Gross per 24 hour  Intake 198.18 ml  Output --  Net 198.18 ml   Filed Weights   07/14/21 2100  Weight: 68 kg    Examination:  General exam: Appears comfortable, not in any acute distress.,  Chronically ill looking. Respiratory system: Clear to auscultation. Respiratory effort normal.  RR 15. Cardiovascular system: S1-S2 heard, regular rate and rhythm, no murmur. Gastrointestinal system: Abdomen is soft, nontender, nondistended, BS +. Central  nervous system: Alert and oriented. No focal neurological deficits. Extremities: No edema, no cyanosis, no clubbing. Skin: No rashes, lesions or ulcers Psychiatry: Judgement and insight appear normal. Mood & affect appropriate.     Data Reviewed: I have personally reviewed following labs and imaging studies  CBC: Recent Labs  Lab 07/14/21 1900 07/15/21 0318  WBC 9.9 9.3  NEUTROABS 6.5  --   HGB 13.0 12.2  HCT 41.7 39.8  MCV 98.3 98.3  PLT 230 166   Basic Metabolic Panel: Recent Labs  Lab 07/14/21 1900 07/15/21 0318  NA 138 141  K 4.3 4.0  CL 102 103  CO2 26 27  GLUCOSE 121* 102*  BUN 22 18  CREATININE 0.86 0.75  CALCIUM 9.0 9.0   GFR: Estimated Creatinine Clearance: 60.9 mL/min (by C-G formula based on SCr of 0.75 mg/dL). Liver Function Tests: Recent Labs  Lab 07/14/21 1900  AST 40  ALT 33  ALKPHOS 100  BILITOT 0.4  PROT 6.2*  ALBUMIN 3.2*   Recent Labs  Lab 07/14/21 1900  LIPASE 38   No results for input(s): AMMONIA in the last 168 hours. Coagulation Profile: No results for input(s): INR, PROTIME in the last 168 hours. Cardiac Enzymes: No results for input(s): CKTOTAL, CKMB, CKMBINDEX, TROPONINI in the last 168 hours. BNP (last 3 results) No results for input(s): PROBNP in the last 8760 hours. HbA1C: No results for input(s): HGBA1C in the last 72 hours. CBG: Recent Labs  Lab 07/14/21 1904  GLUCAP 125*   Lipid Profile: No results for input(s): CHOL, HDL, LDLCALC, TRIG, CHOLHDL, LDLDIRECT in the last 72 hours. Thyroid Function Tests: No results for input(s): TSH, T4TOTAL, FREET4, T3FREE, THYROIDAB in the last 72 hours. Anemia Panel: No results for input(s): VITAMINB12, FOLATE, FERRITIN, TIBC, IRON, RETICCTPCT in the last 72 hours. Sepsis Labs: No results for input(s): PROCALCITON, LATICACIDVEN in the last 168 hours.  Recent Results (from the past 240 hour(s))  Resp Panel by RT-PCR (Flu A&B, Covid) Nasopharyngeal Swab     Status: None    Collection Time: 07/14/21 11:30 PM   Specimen: Nasopharyngeal Swab; Nasopharyngeal(NP) swabs in vial transport medium  Result Value Ref Range Status   SARS Coronavirus 2 by RT PCR NEGATIVE NEGATIVE Final    Comment: (NOTE) SARS-CoV-2 target nucleic acids are NOT DETECTED.  The SARS-CoV-2 RNA is generally detectable in upper respiratory specimens during the acute phase of infection. The lowest concentration of SARS-CoV-2 viral copies this assay can detect is 138 copies/mL. A negative result does not preclude SARS-Cov-2 infection and should not be used as the sole basis for treatment or other patient management decisions. A negative result may occur with  improper specimen collection/handling, submission of specimen other than nasopharyngeal swab, presence of viral mutation(s) within the areas targeted by this assay, and inadequate number of viral copies(<138 copies/mL). A negative result must be combined with clinical observations, patient history, and epidemiological information. The expected result is Negative.  Fact Sheet for Patients:  EntrepreneurPulse.com.au  Fact Sheet for Healthcare Providers:  IncredibleEmployment.be  This test is no t yet approved or cleared by the [place] FDA  and  has been authorized for detection and/or diagnosis of SARS-CoV-2 by FDA under an Emergency Use Authorization (EUA). This EUA will remain  in effect (meaning this test can be used) for the duration of the COVID-19 declaration under Section 564(b)(1) of the Act, 21 U.S.C.section 360bbb-3(b)(1), unless the authorization is terminated  or revoked sooner.       Influenza A by PCR NEGATIVE NEGATIVE Final   Influenza B by PCR NEGATIVE NEGATIVE Final    Comment: (NOTE) The Xpert Xpress SARS-CoV-2/FLU/RSV plus assay is intended as an aid in the diagnosis of influenza from Nasopharyngeal swab specimens and should not be used as a sole basis for treatment.  Nasal washings and aspirates are unacceptable for Xpert Xpress SARS-CoV-2/FLU/RSV testing.  Fact Sheet for Patients: EntrepreneurPulse.com.au  Fact Sheet for Healthcare Providers: IncredibleEmployment.be  This test is not yet approved or cleared by the [place] FDA and has been authorized for detection and/or diagnosis of SARS-CoV-2 by FDA under an Emergency Use Authorization (EUA). This EUA will remain in effect (meaning this test can be used) for the duration of the COVID-19 declaration under Section 564(b)(1) of the Act, 21 U.S.C. section 360bbb-3(b)(1), unless the authorization is terminated or revoked.  Performed at [facility], [place] [address] [place], [place] [address]          Radiology Studies: DG Chest 2 View  Result Date: 07/14/2021 CLINICAL DATA:  Generalized weakness EXAM: CHEST - 2 VIEW COMPARISON:  03/15/2021 FINDINGS: Lungs are well expanded, symmetric, and clear. No pneumothorax or pleural effusion. Cardiac size within normal limits. Pulmonary vascularity is normal. Osseous structures are age-appropriate. No acute bone abnormality. IMPRESSION: No active cardiopulmonary disease. Electronically Signed   By: Fidela Salisbury M.D.   On: 07/14/2021 20:25   DG Thoracic Spine 2 View  Result Date: 07/14/2021 CLINICAL DATA:  Generalized weakness, back pain, recent fall EXAM: THORACIC SPINE 2 VIEWS COMPARISON:  None. FINDINGS: Normal thoracic kyphosis. No acute fracture or listhesis of the thoracic spine. Vertebral body height is preserved. Paraspinal soft tissues are unremarkable. L2 kyphoplasty incidentally noted. Degenerative changes are noted at C3-C6,. IMPRESSION: No acute fracture or listhesis. Electronically Signed   By: Fidela Salisbury M.D.   On: 07/14/2021 20:32   DG Lumbar Spine Complete  Result Date: 07/14/2021 CLINICAL DATA:  Weakness, recent fall, back pain EXAM: LUMBAR SPINE - COMPLETE 4+ VIEW COMPARISON:   12/19/2017 FINDINGS: Normal lumbar lordosis. 7 mm anterolisthesis of L4 upon L5 is unchanged. Interval kyphoplasty of L2 and L5. There are superior endplate fractures of L3 and L4, new since prior examination, with roughly 20-30% loss of height of the L4 vertebral body. These appear chronic in nature. No acute fracture of the lumbar spine. Stable intervertebral disc space narrowing and endplate remodeling at J82-N0 in keeping with changes of a mild to moderate degenerative disc disease. Vascular calcifications are seen within the abdominal aorta. IMPRESSION: No acute fracture or listhesis of the a lumbar spine. Interval vertebral augmentation of L2 and L5. Interval development of remote appearing fractures of L3 and L4 with approximately 20-30% loss of height of L4. Electronically Signed   By: Fidela Salisbury M.D.   On: 07/14/2021 20:30   DG Sacrum/Coccyx  Result Date: 07/14/2021 CLINICAL DATA:  Generalized weakness, pelvic pain, recent fall EXAM: SACRUM AND COCCYX - 2+ VIEW COMPARISON:  None. FINDINGS: The osseous structures are diffusely osteopenic. No acute fracture or dislocation. L5 kyphoplasty has been performed. L3 and L4 vertebral bodies demonstrate loss of  height, not well profiled on this examination. The soft tissues are unremarkable. IMPRESSION: No acute fracture or dislocation. Electronically Signed   By: Fidela Salisbury M.D.   On: 07/14/2021 20:27   CT HEAD WO CONTRAST (5MM)  Result Date: 07/14/2021 CLINICAL DATA:  Weakness and difficulty walking. Worse today. Chronic pain all over. Fall 2 days ago. EXAM: CT HEAD WITHOUT CONTRAST CT CERVICAL SPINE WITHOUT CONTRAST TECHNIQUE: Multidetector CT imaging of the head and cervical spine was performed following the standard protocol without intravenous contrast. Multiplanar CT image reconstructions of the cervical spine were also generated. COMPARISON:  None. FINDINGS: CT HEAD FINDINGS BRAIN: BRAIN Cerebral ventricle sizes are concordant with the  degree of cerebral volume loss. Patchy and confluent areas of decreased attenuation are noted throughout the deep and periventricular white matter of the cerebral hemispheres bilaterally, compatible with chronic microvascular ischemic disease. No evidence of large-territorial acute infarction. No parenchymal hemorrhage. No mass lesion. No extra-axial collection. No mass effect or midline shift. No hydrocephalus. Basilar cisterns are patent. Vascular: No hyperdense vessel. Skull: No acute fracture or focal lesion. Sinuses/Orbits: Partial effusion of the right mastoid air cells. No fluid within the right middle ear. Paranasal sinuses and left mastoid air cells are clear. Bilateral lens replacement. Otherwise orbits are unremarkable. Other: None. CT CERVICAL SPINE FINDINGS Alignment: Normal. Skull base and vertebrae: Multilevel degenerative changes of the spine. No associated severe osseous neural foraminal or central canal stenosis. No acute fracture. No aggressive appearing focal osseous lesion or focal pathologic process. Soft tissues and spinal canal: No prevertebral fluid or swelling. No visible canal hematoma. Upper chest: Unremarkable. Other: None. IMPRESSION: 1. No acute intracranial abnormality. 2. No acute displaced fracture or traumatic listhesis of the cervical spine. 3. Partial fusion of the right mastoid air cells. Electronically Signed   By: Iven Finn M.D.   On: 07/14/2021 21:52   CT Cervical Spine Wo Contrast  Result Date: 07/14/2021 CLINICAL DATA:  Weakness and difficulty walking. Worse today. Chronic pain all over. Fall 2 days ago. EXAM: CT HEAD WITHOUT CONTRAST CT CERVICAL SPINE WITHOUT CONTRAST TECHNIQUE: Multidetector CT imaging of the head and cervical spine was performed following the standard protocol without intravenous contrast. Multiplanar CT image reconstructions of the cervical spine were also generated. COMPARISON:  None. FINDINGS: CT HEAD FINDINGS BRAIN: BRAIN Cerebral  ventricle sizes are concordant with the degree of cerebral volume loss. Patchy and confluent areas of decreased attenuation are noted throughout the deep and periventricular white matter of the cerebral hemispheres bilaterally, compatible with chronic microvascular ischemic disease. No evidence of large-territorial acute infarction. No parenchymal hemorrhage. No mass lesion. No extra-axial collection. No mass effect or midline shift. No hydrocephalus. Basilar cisterns are patent. Vascular: No hyperdense vessel. Skull: No acute fracture or focal lesion. Sinuses/Orbits: Partial effusion of the right mastoid air cells. No fluid within the right middle ear. Paranasal sinuses and left mastoid air cells are clear. Bilateral lens replacement. Otherwise orbits are unremarkable. Other: None. CT CERVICAL SPINE FINDINGS Alignment: Normal. Skull base and vertebrae: Multilevel degenerative changes of the spine. No associated severe osseous neural foraminal or central canal stenosis. No acute fracture. No aggressive appearing focal osseous lesion or focal pathologic process. Soft tissues and spinal canal: No prevertebral fluid or swelling. No visible canal hematoma. Upper chest: Unremarkable. Other: None. IMPRESSION: 1. No acute intracranial abnormality. 2. No acute displaced fracture or traumatic listhesis of the cervical spine. 3. Partial fusion of the right mastoid air cells. Electronically Signed   By: Thomasena Edis  Mckinley Jewel M.D.   On: 07/14/2021 21:52   MR THORACIC SPINE W WO CONTRAST  Result Date: 07/15/2021 CLINICAL DATA:  Follow-up spinal cord injury EXAM: MRI THORACIC AND LUMBAR SPINE WITHOUT AND WITH CONTRAST TECHNIQUE: Multiplanar and multiecho pulse sequences of the thoracic and lumbar spine were obtained without and with intravenous contrast. CONTRAST:  35mL GADAVIST GADOBUTROL 1 MMOL/ML IV SOLN COMPARISON:  Thoracic MRI 03/15/2021 FINDINGS: MRI THORACIC SPINE FINDINGS Alignment:  Normal Vertebrae: Horizontal fracture  through the T12 vertebral body without marrow edema, interval. No acute fracture. Hemangiomas seen at T2 and L1. Cord: The central canal is faintly visible intermittently in the upper thoracic cord, within normal limits. No cord edema or swelling noted. Paraspinal and other soft tissues: Negative Disc levels: Central disc protrusion on a chronic basis at T5-6. Disc narrowing and bulging at T11-12 and T12-L1. Negative facets. MRI LUMBAR SPINE FINDINGS Segmentation:  5 lumbar type vertebrae Alignment:  Grade 1 anterolisthesis at L4-5 Vertebrae: Remote compression fractures of L2-L5 with cement augmentation at L2 and L5. No acute fracture, discitis, or aggressive bone lesion Conus medullaris: Extends to the L2 level and appears normal. Paraspinal and other soft tissues: Fatty atrophy of intrinsic back muscles Disc levels: T12- L1: Disc narrowing with shallow central protrusion. L1-L2: Disc narrowing and left paracentral protrusion at an annular fissure. L2-L3: Disc narrowing and mild bilateral foraminal bulging L3-L4: Disc narrowing and bulging with mild facet spurring. Mild left foraminal narrowing L4-L5: Facet osteoarthritis with spurring and anterolisthesis. The disc is mildly narrowed with circumferential bulging. Moderate bilateral foraminal narrowing L5-S1:Disc narrowing and left eccentric bulging at with an annular fissure. Facet spurring. IMPRESSION: Thoracic MRI: 1. Interval but healed T12 compression fracture since 03/15/2021, with mild height loss. 2. Unremarkable appearance of the cord today. Lumbar MRI: 1. No acute finding or neural compression. 2. Multiple remote compression fractures which have healed. 3. Degeneration most notable at L4-5 where there is anterolisthesis and moderate bilateral foraminal narrowing. Electronically Signed   By: Jorje Guild M.D.   On: 07/15/2021 05:00   MR Lumbar Spine W Wo Contrast  Result Date: 07/15/2021 CLINICAL DATA:  Follow-up spinal cord injury EXAM: MRI  THORACIC AND LUMBAR SPINE WITHOUT AND WITH CONTRAST TECHNIQUE: Multiplanar and multiecho pulse sequences of the thoracic and lumbar spine were obtained without and with intravenous contrast. CONTRAST:  24mL GADAVIST GADOBUTROL 1 MMOL/ML IV SOLN COMPARISON:  Thoracic MRI 03/15/2021 FINDINGS: MRI THORACIC SPINE FINDINGS Alignment:  Normal Vertebrae: Horizontal fracture through the T12 vertebral body without marrow edema, interval. No acute fracture. Hemangiomas seen at T2 and L1. Cord: The central canal is faintly visible intermittently in the upper thoracic cord, within normal limits. No cord edema or swelling noted. Paraspinal and other soft tissues: Negative Disc levels: Central disc protrusion on a chronic basis at T5-6. Disc narrowing and bulging at T11-12 and T12-L1. Negative facets. MRI LUMBAR SPINE FINDINGS Segmentation:  5 lumbar type vertebrae Alignment:  Grade 1 anterolisthesis at L4-5 Vertebrae: Remote compression fractures of L2-L5 with cement augmentation at L2 and L5. No acute fracture, discitis, or aggressive bone lesion Conus medullaris: Extends to the L2 level and appears normal. Paraspinal and other soft tissues: Fatty atrophy of intrinsic back muscles Disc levels: T12- L1: Disc narrowing with shallow central protrusion. L1-L2: Disc narrowing and left paracentral protrusion at an annular fissure. L2-L3: Disc narrowing and mild bilateral foraminal bulging L3-L4: Disc narrowing and bulging with mild facet spurring. Mild left foraminal narrowing L4-L5: Facet osteoarthritis with spurring and anterolisthesis. The  disc is mildly narrowed with circumferential bulging. Moderate bilateral foraminal narrowing L5-S1:Disc narrowing and left eccentric bulging at with an annular fissure. Facet spurring. IMPRESSION: Thoracic MRI: 1. Interval but healed T12 compression fracture since 03/15/2021, with mild height loss. 2. Unremarkable appearance of the cord today. Lumbar MRI: 1. No acute finding or neural  compression. 2. Multiple remote compression fractures which have healed. 3. Degeneration most notable at L4-5 where there is anterolisthesis and moderate bilateral foraminal narrowing. Electronically Signed   By: Jorje Guild M.D.   On: 07/15/2021 05:00    Scheduled Meds:  acyclovir  400 mg Oral BID   diazepam  5 mg Oral BID   DULoxetine  30 mg Oral q AM   DULoxetine  60 mg Oral QHS   ferrous sulfate  325 mg Oral q morning   levETIRAcetam  250 mg Oral BID   mirabegron ER  50 mg Oral QHS   omega-3 acid ethyl esters  1 g Oral q morning   pantoprazole  40 mg Oral QHS   predniSONE  5 mg Oral Q breakfast   pregabalin  100 mg Oral QHS   QUEtiapine  100 mg Oral QHS   rivaroxaban  20 mg Oral Q supper   Continuous Infusions:   LOS: 0 days    Time spent: 35 mins    Ahnya Akre, MD [facility]   If 7PM-7AM, please contact night-coverage

## 2021-07-15 NOTE — ED Notes (Signed)
Pt placed on bedpan

## 2021-07-15 NOTE — Progress Notes (Signed)
Pt husband wanted clarification from hospitalist on why abx were D/c. Secure chat initiated. Per Dwyane Dee, MD: "ABX dc per infections disease. Will restart if pt has fever." Pt family updated

## 2021-07-15 NOTE — Consult Note (Signed)
[facility]    Date of Admission:  07/14/2021     Reason for Consult: legs weakness ?uti    Referring Provider: Dwyane Dee      Lines:  peripheral  Abx: 11/11-c meropenem        Assessment: 75 yo female hx hsv transverse myelitis/encephalitis, chronic suprapubic catheter, hx dvt/pe 2019 and 2021 on xarelto, here for acute on chronic legs weakness concerning for possible uti  No bcx this admission due to lack of sepsis  Reviewed hx. Has had esbl bacteria in urine in the past. The July admission unclear if uti as she also has diarrhea. And didn't get any esbl abxcoverage, and improved rather quickly  It is difficult to say upon my first encounter with patient this is truely uti. So far her albumin is baseline and she has no leukocytosis or sepsis otherwise. Would expect also if bladder infection for her to have autonomic dysreflexia given the high transverse myelitis which she does not have  Urine study in setting of foley and no clear uti syndrome could be misleading. Having said all this, this could be well uti, but will need to do some more diagnostics steps prior to committing to that  **she did mention at times there is leakage around spc or urethra. I query if she developed sediment often and needs more frequent spc change rather than monthly   As she is in-house, would focus on conservative care like fluid/nutrition support and pt/ot, off abx and see how she does  Plan: Stop meropenem for now If she develops sepsis, please repeat blood culture and restart meropenem/vanc while waiting for culture Pt/ot & conservative care per primary team Follow up crp and repeat urine culture after foley cath exchanged On follow up outpatient urology, advised patient to discuss need for more frequent spc change rather than monthly Will continue to follow Discussed with primary team   I spent 60 minute reviewing data/chart, and coordinating care and  >50% direct face to face time providing counseling/discussing diagnostics/treatment plan with patient      ------------------------------------------------ Principal Problem:   Urinary tract infection associated with catheterization of urinary tract, initial encounter ([place]) Active Problems:   Incomplete paraplegia (HCC)   Chronic pain syndrome   Generalized weakness   History of ESBL E. coli infection   History of ESBL Klebsiella pneumoniae infection    HPI: Katelyn Lamb is a 75 y.o. female  75 y.o. 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 July admission 7/12-15 for 1 week of progressive LE weakness and some confusion but also had diarrhea from gi prep. Urine cultures multiple species. Appears to improve with supportive care very quickly. Team thought she has uti and treated with abx on HD#2 and sends out on 5 more days of cefdinir for 7 day abx total  In 8/12 2022 has urine cultures that grew esbl ecoli and klebsiella. Seen in ED for left flank/abd pain/back pain and a fall. Exam with tenderness. Urine was checked and given nitrofurantoin. I am not too sure this was uti either.    At baseline normally able to get to/from bathroom with husband assist, but the day of admission not  Appears per chart (?outside record) --> "Last month pt with worsening weakness.  [facility] grew out > 100k cfu of ESBL K. Variicola (on 10/10). Attempts were made to treat with monurol which worked for a short period before symptoms returned.  Monurol attempted again around mid-month."    No fevers, chills, n/v/rash, diarrhea, sick contact, cough No leukocytosis on admission Mri spine no acute changes Cxr clear No bcx given Spc changed and urine  resent for cultures  She said she feels better soon after arriving in ED, but still with legs weakness  Family History  Problem Relation Age of Onset   Hypertension Mother     Social History   Tobacco Use   Smoking status: Former   Smokeless tobacco: Never   Tobacco comments:    40 years ago   Vaping Use   Vaping Use: Never used  Substance Use Topics   Alcohol use: No   Drug use: Never    Allergies  Allergen Reactions   Demerol [Meperidine] Other (See Comments)    Hallucinations   Percocet [Oxycodone-Acetaminophen] Itching   Amoxicillin-Pot Clavulanate Diarrhea    Severe pain, headache, intestinal infection   Penicillins Itching and Rash    Has patient had a PCN reaction causing immediate rash, facial/tongue/throat swelling, SOB or lightheadedness with hypotension:  NO Has patient had a PCN reaction causing severe rash involving mucus membranes or skin necrosis: No Has patient had a PCN reaction that required hospitalization: No Has patient had a PCN reaction occurring within the last 10 years: Yes If all of the above answers are "NO", then may proceed with Cephalosporin use.    Review of Systems: ROS All Other ROS was negative, except mentioned above   Past Medical History:  Diagnosis Date   Acute deep vein thrombosis (DVT) of popliteal vein of left lower extremity (HCC)    Anxiety    Back pain    Chronic kidney disease    Encephalomyelitis    Gait abnormality 11/14/2016   Memory difficulty 03/02/2019   Myelitis due to herpes simplex (HCC)    Neurogenic bladder        Scheduled Meds:  acyclovir  400 mg Oral BID   diazepam  5 mg Oral BID   DULoxetine  30 mg Oral q AM   DULoxetine  60 mg Oral QHS   ferrous sulfate  325 mg Oral q morning   levETIRAcetam  250 mg Oral BID   mirabegron ER  50 mg Oral QHS   omega-3 acid ethyl esters  1 g Oral q morning   pantoprazole  40 mg Oral QHS   predniSONE  5 mg Oral Q breakfast   pregabalin  100 mg Oral QHS    QUEtiapine  100 mg Oral QHS   rivaroxaban  20 mg Oral Q supper   Continuous Infusions:  meropenem (MERREM) IV Stopped (07/15/21 0916)   PRN Meds:.acetaminophen **OR** acetaminophen, docusate sodium, HYDROcodone-acetaminophen, ondansetron **OR** ondansetron (ZOFRAN) IV, polyethylene glycol   OBJECTIVE: Blood pressure 108/69, pulse 89, temperature 98.9 F (37.2 C), temperature source Oral, resp. rate (!) 26, weight 68 kg, SpO2 92 %.  Physical Exam  General/constitutional: no distress, pleasant HEENT: Normocephalic, PER, Conj Clear, EOMI, Oropharynx clear Neck supple CV: rrr no mrg Lungs: clear to auscultation, normal respiratory effort Abd: Soft, Nontender Ext: no edema Skin: No Rash - spc site no erythema/tenderness/purulence Neuro: bilateral LE weakness 3/5 symmetric; no rigidity MSK: no peripheral joint swelling/tenderness/warmth; back spines nontender Psych: alert/oriented  Lab Results Lab Results  Component Value Date   WBC 9.3 07/15/2021   HGB 12.2 07/15/2021   HCT 39.8 07/15/2021   MCV 98.3 07/15/2021   PLT 237 07/15/2021    Lab Results  Component Value Date   CREATININE 0.75 07/15/2021   BUN 18 07/15/2021   NA 141 07/15/2021   K 4.0 07/15/2021   CL 103 07/15/2021   CO2 27 07/15/2021    Lab Results  Component Value Date   ALT 33 07/14/2021   AST 40 07/14/2021   ALKPHOS 100 07/14/2021   BILITOT 0.4 07/14/2021      Microbiology: Recent Results (from the past 240 hour(s))  Resp Panel by RT-PCR (Flu A&B, Covid) Nasopharyngeal Swab     Status: None   Collection Time: 07/14/21 11:30 PM   Specimen: Nasopharyngeal Swab; Nasopharyngeal(NP) swabs in vial transport medium  Result Value Ref Range Status   SARS Coronavirus 2 by RT PCR NEGATIVE NEGATIVE Final    Comment: (NOTE) SARS-CoV-2 target nucleic acids are NOT DETECTED.  The SARS-CoV-2 RNA is generally detectable in upper respiratory specimens during the acute phase of infection. The  lowest concentration of SARS-CoV-2 viral copies this assay can detect is 138 copies/mL. A negative result does not preclude SARS-Cov-2 infection and should not be used as the sole basis for treatment or other patient management decisions. A negative result may occur with  improper specimen collection/handling, submission of specimen other than nasopharyngeal swab, presence of viral mutation(s) within the areas targeted by this assay, and inadequate number of viral copies(<138 copies/mL). A negative result must be combined with clinical observations, patient history, and epidemiological information. The expected result is Negative.  Fact Sheet for Patients:  EntrepreneurPulse.com.au  Fact Sheet for Healthcare Providers:  IncredibleEmployment.be  This test is no t yet approved or cleared by the [place] FDA and  has been authorized for detection and/or diagnosis of SARS-CoV-2 by FDA under an Emergency Use Authorization (EUA). This EUA will remain  in effect (meaning this test can be used) for the duration of the COVID-19 declaration under Section 564(b)(1) of the Act, 21 U.S.C.section 360bbb-3(b)(1), unless the authorization is terminated  or revoked sooner.       Influenza A by PCR NEGATIVE NEGATIVE Final   Influenza B by PCR NEGATIVE NEGATIVE Final    Comment: (NOTE) The Xpert Xpress SARS-CoV-2/FLU/RSV plus assay is intended as an aid in the diagnosis of influenza from Nasopharyngeal swab specimens and should not be used as a sole basis for treatment. Nasal washings and aspirates are unacceptable for Xpert Xpress SARS-CoV-2/FLU/RSV testing.  Fact Sheet for Patients: EntrepreneurPulse.com.au  Fact Sheet for Healthcare Providers: IncredibleEmployment.be  This test is not yet approved or cleared by the [place] FDA and has been authorized for detection and/or diagnosis of SARS-CoV-2 by FDA under  an Emergency Use Authorization (EUA). This EUA will remain in effect (meaning this test can be used) for the duration of the COVID-19 declaration under Section 564(b)(1) of the Act, 21 U.S.C. section 360bbb-3(b)(1), unless the authorization is terminated or revoked.  Performed at [facility], [place] [address] [place], [place] [address]     11/11 urinalysis 1900 Old foley -- >50 wbc; 11-20 rbc, many bacteria  11/11 urinalysis 2223 New foley --> >50 wbc, >50 rbc, many bacteria  Serology: Albumin stable low 3's compared to 04/2021  Imaging: If present, new imagings (plain films, ct scans, and mri) have been personally visualized and interpreted; radiology reports have been reviewed.  Decision making incorporated into the Impression / Recommendations.  11/12 lumbar thoracic spine mri 1. No acute finding or neural compression. 2. Multiple remote compression fractures which have healed. 3. Degeneration most notable at L4-5 where there is anterolisthesis and moderate bilateral foraminal narrowing.  11/11 cxr No active cardiopulm disease    Jabier Mutton, MD [facility] for [facility] [place] [phone] pager    07/15/2021, 12:44 PM

## 2021-07-15 NOTE — Progress Notes (Signed)
NEW ADMISSION NOTE New Admission Note:   Arrival Method: Patient arrived from ED on stretcher accompanied by staff and family. Mental Orientation: alert and oriented x 4. Telemetry: N/A Assessment: in progress Skin: dry, warm and intact except for ecchymosis on right upper arm noted.   IV: R wrist SL Pain: denies any pain. Tubes: Supra pubic catheter, light yellow urine noted in the foley bag. Safety Measures: Safety Fall Prevention Plan has been given, discussed and signed Admission: in progress [facility] [place] Orientation: Patient has been orientated to the room, unit and staff.  Family:  Husband at bedside  Orders have been reviewed and implemented. Will continue to monitor the patient. Call light has been placed within reach and bed alarm has been activated.   Amaryllis Dyke, RN

## 2021-07-15 NOTE — ED Notes (Signed)
ED TO INPATIENT HANDOFF REPORT  ED Nurse Name and Phone #:   S Name/Age/Gender Katelyn Lamb 75 y.o. female Room/Bed: 043C/043C  Code Status   Code Status: Full Code  Home/SNF/Other Home Patient oriented to: self and place Is this baseline? Yes   Triage Complete: Triage complete  Chief Complaint Generalized weakness [R53.1]  Triage Note Pt bib [facility] from home where she lives with her husband. Pt has had weakness and difficulty walking for 5 years but today it is worse. Pt complains of chronic pain all over. Pt did fall 2 days ago and hit her head but does not have a headache now.  EMS vitals: 110/70, 90 NSR, 16R, 95%RA,134CBG   Allergies Allergies  Allergen Reactions   Demerol [Meperidine] Other (See Comments)    Hallucinations   Percocet [Oxycodone-Acetaminophen] Itching   Amoxicillin-Pot Clavulanate Diarrhea    Severe pain, headache, intestinal infection   Penicillins Itching and Rash    Has patient had a PCN reaction causing immediate rash, facial/tongue/throat swelling, SOB or lightheadedness with hypotension:  NO Has patient had a PCN reaction causing severe rash involving mucus membranes or skin necrosis: No Has patient had a PCN reaction that required hospitalization: No Has patient had a PCN reaction occurring within the last 10 years: Yes If all of the above answers are "NO", then may proceed with Cephalosporin use.    Level of Care/Admitting Diagnosis ED Disposition     ED Disposition  Admit   Condition  --   Comment  Hospital Area: [facility] [[id number]]  Level of Care: Med-Surg [16]  May admit patient to [name] or [facility] if equivalent level of care is available:: No  Covid Evaluation: Asymptomatic Screening Protocol (No Symptoms)  Diagnosis: Generalized weakness [193790]  Admitting Physician: Etta Quill [[id number]]  Attending Physician: Tarry Kos  Estimated length of stay: past midnight tomorrow   Certification:: I certify this patient will need inpatient services for at least 2 midnights          B Medical/Surgery History Past Medical History:  Diagnosis Date   Acute deep vein thrombosis (DVT) of popliteal vein of left lower extremity (HCC)    Anxiety    Back pain    Chronic kidney disease    Encephalomyelitis    Gait abnormality 11/14/2016   Memory difficulty 03/02/2019   Myelitis due to herpes simplex ([place])    Neurogenic bladder    Past Surgical History:  Procedure Laterality Date   ABDOMINAL HYSTERECTOMY     BLADDER REPAIR     CESAREAN SECTION     IR KYPHO LUMBAR INC FX REDUCE BONE BX UNI/BIL CANNULATION INC/IMAGING  04/11/2018   TUBAL LIGATION       A IV Location/Drains/Wounds Patient Lines/Drains/Airways Status     Active Line/Drains/Airways     Name Placement date Placement time Site Days   Peripheral IV 07/14/21 20 G 1" Anterior;Right Wrist 07/14/21  1937  Wrist  1   Suprapubic Catheter Non-latex 14 Fr. 08/21/20  1602  Non-latex  328   Pressure Injury 02/12/20 Heel Right Deep Tissue Pressure Injury - Purple or maroon localized area of discolored intact skin or blood-filled blister due to damage of underlying soft tissue from pressure and/or shear. 02/12/20  0945  -- 519            Intake/Output Last 24 hours  Intake/Output Summary (Last 24 hours) at 07/15/2021 1736 Last data filed at 07/15/2021 1622 Gross per 24 hour  Intake 498.18 ml  Output --  Net 498.18 ml    Labs/Imaging Results for orders placed or performed during the hospital encounter of 07/14/21 (from the past 48 hour(s))  CBC with Differential/Platelet     Status: Abnormal   Collection Time: 07/14/21  7:00 PM  Result Value Ref Range   WBC 9.9 4.0 - 10.5 K/uL   RBC 4.24 3.87 - 5.11 MIL/uL   Hemoglobin 13.0 12.0 - 15.0 g/dL   HCT 41.7 36.0 - 46.0 %   MCV 98.3 80.0 - 100.0 fL   MCH 30.7 26.0 - 34.0 pg   MCHC 31.2 30.0 - 36.0 g/dL   RDW 16.2 (H) 11.5 - 15.5 %   Platelets 230  150 - 400 K/uL   nRBC 0.0 0.0 - 0.2 %   Neutrophils Relative % 65 %   Neutro Abs 6.5 1.7 - 7.7 K/uL   Lymphocytes Relative 23 %   Lymphs Abs 2.3 0.7 - 4.0 K/uL   Monocytes Relative 7 %   Monocytes Absolute 0.7 0.1 - 1.0 K/uL   Eosinophils Relative 3 %   Eosinophils Absolute 0.3 0.0 - 0.5 K/uL   Basophils Relative 1 %   Basophils Absolute 0.1 0.0 - 0.1 K/uL   Immature Granulocytes 1 %   Abs Immature Granulocytes 0.05 0.00 - 0.07 K/uL    Comment: Performed at [facility], [address] [place], [place] [address]  Comprehensive metabolic panel     Status: Abnormal   Collection Time: 07/14/21  7:00 PM  Result Value Ref Range   Sodium 138 135 - 145 mmol/L   Potassium 4.3 3.5 - 5.1 mmol/L   Chloride 102 98 - 111 mmol/L   CO2 26 22 - 32 mmol/L   Glucose, Bld 121 (H) 70 - 99 mg/dL    Comment: Glucose reference range applies only to samples taken after fasting for at least 8 hours.   BUN 22 8 - 23 mg/dL   Creatinine, Ser 0.86 0.44 - 1.00 mg/dL   Calcium 9.0 8.9 - 10.3 mg/dL   Total Protein 6.2 (L) 6.5 - 8.1 g/dL   Albumin 3.2 (L) 3.5 - 5.0 g/dL   AST 40 15 - 41 U/L   ALT 33 0 - 44 U/L   Alkaline Phosphatase 100 38 - 126 U/L   Total Bilirubin 0.4 0.3 - 1.2 mg/dL   GFR, Estimated >60 >60 mL/min    Comment: (NOTE) Calculated using the CKD-EPI Creatinine Equation (2021)    Anion gap 10 5 - 15    Comment: Performed at [facility], [place] [address], [place], [name] [address]  Troponin I (High Sensitivity)     Status: None   Collection Time: 07/14/21  7:00 PM  Result Value Ref Range   Troponin I (High Sensitivity) 5 <18 ng/L    Comment: (NOTE) Elevated high sensitivity troponin I (hsTnI) values and significant  changes across serial measurements may suggest ACS but many other  chronic and acute conditions are known to elevate hsTnI results.  Refer to the "Links" section for chest pain algorithms and additional  guidance. Performed at [facility], [place]  [address], [place], [place] [address]   Lipase, blood     Status: None   Collection Time: 07/14/21  7:00 PM  Result Value Ref Range   Lipase 38 11 - 51 U/L    Comment: Performed at [facility], [place] [address] [place], [place] [address]  Urinalysis, Routine  w reflex microscopic Urine, Catheterized     Status: Abnormal   Collection Time: 07/14/21  7:01 PM  Result Value Ref Range   Color, Urine AMBER (A) YELLOW    Comment: BIOCHEMICALS MAY BE AFFECTED BY COLOR   APPearance CLOUDY (A) CLEAR   Specific Gravity, Urine 1.016 1.005 - 1.030   pH 6.0 5.0 - 8.0   Glucose, UA NEGATIVE NEGATIVE mg/dL   Hgb urine dipstick NEGATIVE NEGATIVE   Bilirubin Urine NEGATIVE NEGATIVE   Ketones, ur NEGATIVE NEGATIVE mg/dL   Protein, ur NEGATIVE NEGATIVE mg/dL   Nitrite NEGATIVE NEGATIVE   Leukocytes,Ua LARGE (A) NEGATIVE   RBC / HPF 11-20 0 - 5 RBC/hpf   WBC, UA >50 (H) 0 - 5 WBC/hpf   Bacteria, UA MANY (A) NONE SEEN   Squamous Epithelial / LPF 0-5 0 - 5   Mucus PRESENT    Ca Oxalate Crys, UA PRESENT     Comment: Performed at [facility], [address] [place], [place] [address]  POC CBG, ED     Status: Abnormal   Collection Time: 07/14/21  7:04 PM  Result Value Ref Range   Glucose-Capillary 125 (H) 70 - 99 mg/dL    Comment: Glucose reference range applies only to samples taken after fasting for at least 8 hours.   Comment 1 Notify RN    Comment 2 Document in Chart   Urinalysis, Routine w reflex microscopic Urine, Catheterized     Status: Abnormal   Collection Time: 07/14/21 10:23 PM  Result Value Ref Range   Color, Urine YELLOW YELLOW   APPearance CLOUDY (A) CLEAR   Specific Gravity, Urine 1.018 1.005 - 1.030   pH 7.0 5.0 - 8.0   Glucose, UA NEGATIVE NEGATIVE mg/dL   Hgb urine dipstick SMALL (A) NEGATIVE   Bilirubin Urine NEGATIVE NEGATIVE   Ketones, ur NEGATIVE NEGATIVE mg/dL   Protein, ur 100 (A) NEGATIVE mg/dL   Nitrite NEGATIVE NEGATIVE   Leukocytes,Ua LARGE (A) NEGATIVE    RBC / HPF >50 (H) 0 - 5 RBC/hpf   WBC, UA >50 (H) 0 - 5 WBC/hpf   Bacteria, UA MANY (A) NONE SEEN   Squamous Epithelial / LPF 21-50 0 - 5   WBC Clumps PRESENT    Mucus PRESENT    Ca Oxalate Crys, UA PRESENT    Non Squamous Epithelial 11-20 (A) NONE SEEN    Comment: Performed at [facility], [place] [address], [place], [place] [address]  Resp Panel by RT-PCR (Flu A&B, Covid) Nasopharyngeal Swab     Status: None   Collection Time: 07/14/21 11:30 PM   Specimen: Nasopharyngeal Swab; Nasopharyngeal(NP) swabs in vial transport medium  Result Value Ref Range   SARS Coronavirus 2 by RT PCR NEGATIVE NEGATIVE    Comment: (NOTE) SARS-CoV-2 target nucleic acids are NOT DETECTED.  The SARS-CoV-2 RNA is generally detectable in upper respiratory specimens during the acute phase of infection. The lowest concentration of SARS-CoV-2 viral copies this assay can detect is 138 copies/mL. A negative result does not preclude SARS-Cov-2 infection and should not be used as the sole basis for treatment or other patient management decisions. A negative result may occur with  improper specimen collection/handling, submission of specimen other than nasopharyngeal swab, presence of viral mutation(s) within the areas targeted by this assay, and inadequate number of viral copies(<138 copies/mL). A negative result must be combined with clinical observations, patient history, and epidemiological information. The expected result is Negative.  Fact  Sheet for Patients:  EntrepreneurPulse.com.au  Fact Sheet for Healthcare Providers:  IncredibleEmployment.be  This test is no t yet approved or cleared by the [place] FDA and  has been authorized for detection and/or diagnosis of SARS-CoV-2 by FDA under an Emergency Use Authorization (EUA). This EUA will remain  in effect (meaning this test can be used) for the duration of the COVID-19 declaration under Section 564(b)(1) of  the Act, 21 U.S.C.section 360bbb-3(b)(1), unless the authorization is terminated  or revoked sooner.       Influenza A by PCR NEGATIVE NEGATIVE   Influenza B by PCR NEGATIVE NEGATIVE    Comment: (NOTE) The Xpert Xpress SARS-CoV-2/FLU/RSV plus assay is intended as an aid in the diagnosis of influenza from Nasopharyngeal swab specimens and should not be used as a sole basis for treatment. Nasal washings and aspirates are unacceptable for Xpert Xpress SARS-CoV-2/FLU/RSV testing.  Fact Sheet for Patients: EntrepreneurPulse.com.au  Fact Sheet for Healthcare Providers: IncredibleEmployment.be  This test is not yet approved or cleared by the [place] FDA and has been authorized for detection and/or diagnosis of SARS-CoV-2 by FDA under an Emergency Use Authorization (EUA). This EUA will remain in effect (meaning this test can be used) for the duration of the COVID-19 declaration under Section 564(b)(1) of the Act, 21 U.S.C. section 360bbb-3(b)(1), unless the authorization is terminated or revoked.  Performed at [facility], [place] [address], [place], [name] [address]   Troponin I (High Sensitivity)     Status: None   Collection Time: 07/15/21  3:18 AM  Result Value Ref Range   Troponin I (High Sensitivity) 4 <18 ng/L    Comment: (NOTE) Elevated high sensitivity troponin I (hsTnI) values and significant  changes across serial measurements may suggest ACS but many other  chronic and acute conditions are known to elevate hsTnI results.  Refer to the "Links" section for chest pain algorithms and additional  guidance. Performed at [facility], [place] [address], [place], [name] [address]   CBC     Status: Abnormal   Collection Time: 07/15/21  3:18 AM  Result Value Ref Range   WBC 9.3 4.0 - 10.5 K/uL   RBC 4.05 3.87 - 5.11 MIL/uL   Hemoglobin 12.2 12.0 - 15.0 g/dL   HCT 39.8 36.0 - 46.0 %   MCV 98.3 80.0 - 100.0 fL   MCH 30.1  26.0 - 34.0 pg   MCHC 30.7 30.0 - 36.0 g/dL   RDW 16.1 (H) 11.5 - 15.5 %   Platelets 237 150 - 400 K/uL   nRBC 0.0 0.0 - 0.2 %    Comment: Performed at [facility], [place] [address] [place], [place] [address]  Basic metabolic panel     Status: Abnormal   Collection Time: 07/15/21  3:18 AM  Result Value Ref Range   Sodium 141 135 - 145 mmol/L   Potassium 4.0 3.5 - 5.1 mmol/L   Chloride 103 98 - 111 mmol/L   CO2 27 22 - 32 mmol/L   Glucose, Bld 102 (H) 70 - 99 mg/dL    Comment: Glucose reference range applies only to samples taken after fasting for at least 8 hours.   BUN 18 8 - 23 mg/dL   Creatinine, Ser 0.75 0.44 - 1.00 mg/dL   Calcium 9.0 8.9 - 10.3 mg/dL   GFR, Estimated >60 >60 mL/min    Comment: (NOTE) Calculated using the CKD-EPI Creatinine Equation (2021)    Anion gap 11  5 - 15    Comment: Performed at [facility], [place] [address] [place], [place] [address]   DG Chest 2 View  Result Date: 07/14/2021 CLINICAL DATA:  Generalized weakness EXAM: CHEST - 2 VIEW COMPARISON:  03/15/2021 FINDINGS: Lungs are well expanded, symmetric, and clear. No pneumothorax or pleural effusion. Cardiac size within normal limits. Pulmonary vascularity is normal. Osseous structures are age-appropriate. No acute bone abnormality. IMPRESSION: No active cardiopulmonary disease. Electronically Signed   By: Fidela Salisbury M.D.   On: 07/14/2021 20:25   DG Thoracic Spine 2 View  Result Date: 07/14/2021 CLINICAL DATA:  Generalized weakness, back pain, recent fall EXAM: THORACIC SPINE 2 VIEWS COMPARISON:  None. FINDINGS: Normal thoracic kyphosis. No acute fracture or listhesis of the thoracic spine. Vertebral body height is preserved. Paraspinal soft tissues are unremarkable. L2 kyphoplasty incidentally noted. Degenerative changes are noted at C3-C6,. IMPRESSION: No acute fracture or listhesis. Electronically Signed   By: Fidela Salisbury M.D.   On: 07/14/2021 20:32   DG Lumbar Spine  Complete  Result Date: 07/14/2021 CLINICAL DATA:  Weakness, recent fall, back pain EXAM: LUMBAR SPINE - COMPLETE 4+ VIEW COMPARISON:  12/19/2017 FINDINGS: Normal lumbar lordosis. 7 mm anterolisthesis of L4 upon L5 is unchanged. Interval kyphoplasty of L2 and L5. There are superior endplate fractures of L3 and L4, new since prior examination, with roughly 20-30% loss of height of the L4 vertebral body. These appear chronic in nature. No acute fracture of the lumbar spine. Stable intervertebral disc space narrowing and endplate remodeling at M42-A8 in keeping with changes of a mild to moderate degenerative disc disease. Vascular calcifications are seen within the abdominal aorta. IMPRESSION: No acute fracture or listhesis of the a lumbar spine. Interval vertebral augmentation of L2 and L5. Interval development of remote appearing fractures of L3 and L4 with approximately 20-30% loss of height of L4. Electronically Signed   By: Fidela Salisbury M.D.   On: 07/14/2021 20:30   DG Sacrum/Coccyx  Result Date: 07/14/2021 CLINICAL DATA:  Generalized weakness, pelvic pain, recent fall EXAM: SACRUM AND COCCYX - 2+ VIEW COMPARISON:  None. FINDINGS: The osseous structures are diffusely osteopenic. No acute fracture or dislocation. L5 kyphoplasty has been performed. L3 and L4 vertebral bodies demonstrate loss of height, not well profiled on this examination. The soft tissues are unremarkable. IMPRESSION: No acute fracture or dislocation. Electronically Signed   By: Fidela Salisbury M.D.   On: 07/14/2021 20:27   CT HEAD WO CONTRAST (5MM)  Result Date: 07/14/2021 CLINICAL DATA:  Weakness and difficulty walking. Worse today. Chronic pain all over. Fall 2 days ago. EXAM: CT HEAD WITHOUT CONTRAST CT CERVICAL SPINE WITHOUT CONTRAST TECHNIQUE: Multidetector CT imaging of the head and cervical spine was performed following the standard protocol without intravenous contrast. Multiplanar CT image reconstructions of the cervical  spine were also generated. COMPARISON:  None. FINDINGS: CT HEAD FINDINGS BRAIN: BRAIN Cerebral ventricle sizes are concordant with the degree of cerebral volume loss. Patchy and confluent areas of decreased attenuation are noted throughout the deep and periventricular white matter of the cerebral hemispheres bilaterally, compatible with chronic microvascular ischemic disease. No evidence of large-territorial acute infarction. No parenchymal hemorrhage. No mass lesion. No extra-axial collection. No mass effect or midline shift. No hydrocephalus. Basilar cisterns are patent. Vascular: No hyperdense vessel. Skull: No acute fracture or focal lesion. Sinuses/Orbits: Partial effusion of the right mastoid air cells. No fluid within the right middle ear. Paranasal sinuses and left mastoid air cells are  clear. Bilateral lens replacement. Otherwise orbits are unremarkable. Other: None. CT CERVICAL SPINE FINDINGS Alignment: Normal. Skull base and vertebrae: Multilevel degenerative changes of the spine. No associated severe osseous neural foraminal or central canal stenosis. No acute fracture. No aggressive appearing focal osseous lesion or focal pathologic process. Soft tissues and spinal canal: No prevertebral fluid or swelling. No visible canal hematoma. Upper chest: Unremarkable. Other: None. IMPRESSION: 1. No acute intracranial abnormality. 2. No acute displaced fracture or traumatic listhesis of the cervical spine. 3. Partial fusion of the right mastoid air cells. Electronically Signed   By: Iven Finn M.D.   On: 07/14/2021 21:52   CT Cervical Spine Wo Contrast  Result Date: 07/14/2021 CLINICAL DATA:  Weakness and difficulty walking. Worse today. Chronic pain all over. Fall 2 days ago. EXAM: CT HEAD WITHOUT CONTRAST CT CERVICAL SPINE WITHOUT CONTRAST TECHNIQUE: Multidetector CT imaging of the head and cervical spine was performed following the standard protocol without intravenous contrast. Multiplanar CT image  reconstructions of the cervical spine were also generated. COMPARISON:  None. FINDINGS: CT HEAD FINDINGS BRAIN: BRAIN Cerebral ventricle sizes are concordant with the degree of cerebral volume loss. Patchy and confluent areas of decreased attenuation are noted throughout the deep and periventricular white matter of the cerebral hemispheres bilaterally, compatible with chronic microvascular ischemic disease. No evidence of large-territorial acute infarction. No parenchymal hemorrhage. No mass lesion. No extra-axial collection. No mass effect or midline shift. No hydrocephalus. Basilar cisterns are patent. Vascular: No hyperdense vessel. Skull: No acute fracture or focal lesion. Sinuses/Orbits: Partial effusion of the right mastoid air cells. No fluid within the right middle ear. Paranasal sinuses and left mastoid air cells are clear. Bilateral lens replacement. Otherwise orbits are unremarkable. Other: None. CT CERVICAL SPINE FINDINGS Alignment: Normal. Skull base and vertebrae: Multilevel degenerative changes of the spine. No associated severe osseous neural foraminal or central canal stenosis. No acute fracture. No aggressive appearing focal osseous lesion or focal pathologic process. Soft tissues and spinal canal: No prevertebral fluid or swelling. No visible canal hematoma. Upper chest: Unremarkable. Other: None. IMPRESSION: 1. No acute intracranial abnormality. 2. No acute displaced fracture or traumatic listhesis of the cervical spine. 3. Partial fusion of the right mastoid air cells. Electronically Signed   By: Iven Finn M.D.   On: 07/14/2021 21:52   MR THORACIC SPINE W WO CONTRAST  Result Date: 07/15/2021 CLINICAL DATA:  Follow-up spinal cord injury EXAM: MRI THORACIC AND LUMBAR SPINE WITHOUT AND WITH CONTRAST TECHNIQUE: Multiplanar and multiecho pulse sequences of the thoracic and lumbar spine were obtained without and with intravenous contrast. CONTRAST:  86mL GADAVIST GADOBUTROL 1 MMOL/ML IV SOLN  COMPARISON:  Thoracic MRI 03/15/2021 FINDINGS: MRI THORACIC SPINE FINDINGS Alignment:  Normal Vertebrae: Horizontal fracture through the T12 vertebral body without marrow edema, interval. No acute fracture. Hemangiomas seen at T2 and L1. Cord: The central canal is faintly visible intermittently in the upper thoracic cord, within normal limits. No cord edema or swelling noted. Paraspinal and other soft tissues: Negative Disc levels: Central disc protrusion on a chronic basis at T5-6. Disc narrowing and bulging at T11-12 and T12-L1. Negative facets. MRI LUMBAR SPINE FINDINGS Segmentation:  5 lumbar type vertebrae Alignment:  Grade 1 anterolisthesis at L4-5 Vertebrae: Remote compression fractures of L2-L5 with cement augmentation at L2 and L5. No acute fracture, discitis, or aggressive bone lesion Conus medullaris: Extends to the L2 level and appears normal. Paraspinal and other soft tissues: Fatty atrophy of intrinsic back muscles Disc levels: T12-  L1: Disc narrowing with shallow central protrusion. L1-L2: Disc narrowing and left paracentral protrusion at an annular fissure. L2-L3: Disc narrowing and mild bilateral foraminal bulging L3-L4: Disc narrowing and bulging with mild facet spurring. Mild left foraminal narrowing L4-L5: Facet osteoarthritis with spurring and anterolisthesis. The disc is mildly narrowed with circumferential bulging. Moderate bilateral foraminal narrowing L5-S1:Disc narrowing and left eccentric bulging at with an annular fissure. Facet spurring. IMPRESSION: Thoracic MRI: 1. Interval but healed T12 compression fracture since 03/15/2021, with mild height loss. 2. Unremarkable appearance of the cord today. Lumbar MRI: 1. No acute finding or neural compression. 2. Multiple remote compression fractures which have healed. 3. Degeneration most notable at L4-5 where there is anterolisthesis and moderate bilateral foraminal narrowing. Electronically Signed   By: Jorje Guild M.D.   On: 07/15/2021  05:00   MR Lumbar Spine W Wo Contrast  Result Date: 07/15/2021 CLINICAL DATA:  Follow-up spinal cord injury EXAM: MRI THORACIC AND LUMBAR SPINE WITHOUT AND WITH CONTRAST TECHNIQUE: Multiplanar and multiecho pulse sequences of the thoracic and lumbar spine were obtained without and with intravenous contrast. CONTRAST:  49mL GADAVIST GADOBUTROL 1 MMOL/ML IV SOLN COMPARISON:  Thoracic MRI 03/15/2021 FINDINGS: MRI THORACIC SPINE FINDINGS Alignment:  Normal Vertebrae: Horizontal fracture through the T12 vertebral body without marrow edema, interval. No acute fracture. Hemangiomas seen at T2 and L1. Cord: The central canal is faintly visible intermittently in the upper thoracic cord, within normal limits. No cord edema or swelling noted. Paraspinal and other soft tissues: Negative Disc levels: Central disc protrusion on a chronic basis at T5-6. Disc narrowing and bulging at T11-12 and T12-L1. Negative facets. MRI LUMBAR SPINE FINDINGS Segmentation:  5 lumbar type vertebrae Alignment:  Grade 1 anterolisthesis at L4-5 Vertebrae: Remote compression fractures of L2-L5 with cement augmentation at L2 and L5. No acute fracture, discitis, or aggressive bone lesion Conus medullaris: Extends to the L2 level and appears normal. Paraspinal and other soft tissues: Fatty atrophy of intrinsic back muscles Disc levels: T12- L1: Disc narrowing with shallow central protrusion. L1-L2: Disc narrowing and left paracentral protrusion at an annular fissure. L2-L3: Disc narrowing and mild bilateral foraminal bulging L3-L4: Disc narrowing and bulging with mild facet spurring. Mild left foraminal narrowing L4-L5: Facet osteoarthritis with spurring and anterolisthesis. The disc is mildly narrowed with circumferential bulging. Moderate bilateral foraminal narrowing L5-S1:Disc narrowing and left eccentric bulging at with an annular fissure. Facet spurring. IMPRESSION: Thoracic MRI: 1. Interval but healed T12 compression fracture since 03/15/2021,  with mild height loss. 2. Unremarkable appearance of the cord today. Lumbar MRI: 1. No acute finding or neural compression. 2. Multiple remote compression fractures which have healed. 3. Degeneration most notable at L4-5 where there is anterolisthesis and moderate bilateral foraminal narrowing. Electronically Signed   By: Jorje Guild M.D.   On: 07/15/2021 05:00    Pending Labs Unresulted Labs (From admission, onward)     Start     Ordered   07/15/21 0834  C-reactive protein  Once,   R        07/15/21 4827   07/14/21 1901  Urine Culture  Once,   STAT       Question:  Indication  Answer:  Altered mental status (if no other cause identified)   07/14/21 1901            Vitals/Pain Today's Vitals   07/15/21 1230 07/15/21 1315 07/15/21 1400 07/15/21 1728  BP: 109/83 121/88 110/67 102/75  Pulse: 90 96 95 98  Resp: Marland Kitchen)  23 (!) 27 (!) 22 (!) 23  Temp:    98.7 F (37.1 C)  TempSrc:    Oral  SpO2: 93% 92% 92% 91%  Weight:      PainSc:    Asleep    Isolation Precautions No active isolations  Medications Medications  acetaminophen (TYLENOL) tablet 650 mg (has no administration in time range)    Or  acetaminophen (TYLENOL) suppository 650 mg (has no administration in time range)  ondansetron (ZOFRAN) tablet 4 mg (has no administration in time range)    Or  ondansetron (ZOFRAN) injection 4 mg (has no administration in time range)  levETIRAcetam (KEPPRA) tablet 250 mg (250 mg Oral Given 07/15/21 1004)  predniSONE (DELTASONE) tablet 5 mg (5 mg Oral Given 07/15/21 0836)  pantoprazole (PROTONIX) EC tablet 40 mg (40 mg Oral Given 07/15/21 0113)  pregabalin (LYRICA) capsule 100 mg (100 mg Oral Given 07/15/21 0113)  QUEtiapine (SEROQUEL) tablet 100 mg (100 mg Oral Given 07/15/21 0114)  rivaroxaban (XARELTO) tablet 20 mg (20 mg Oral Given 07/15/21 0113)  polyethylene glycol (MIRALAX / GLYCOLAX) packet 17 g (has no administration in time range)  omega-3 acid ethyl esters (LOVAZA) capsule  1 g (1 g Oral Given 07/15/21 1004)  DULoxetine (CYMBALTA) DR capsule 30 mg (30 mg Oral Given 07/15/21 0836)  DULoxetine (CYMBALTA) DR capsule 60 mg (60 mg Oral Given 07/15/21 0113)  HYDROcodone-acetaminophen (NORCO) 10-325 MG per tablet 1 tablet (has no administration in time range)  ferrous sulfate tablet 325 mg (325 mg Oral Given 07/15/21 0839)  mirabegron ER (MYRBETRIQ) tablet 50 mg (50 mg Oral Given 07/15/21 0114)  diazepam (VALIUM) tablet 5 mg (5 mg Oral Given 07/15/21 0836)  docusate sodium (COLACE) capsule 200 mg (has no administration in time range)  acyclovir (ZOVIRAX) tablet 400 mg (400 mg Oral Given 07/15/21 1004)  gadobutrol (GADAVIST) 1 MMOL/ML injection 6 mL (6 mLs Intravenous Contrast Given 07/15/21 0450)  gadobutrol (GADAVIST) 1 MMOL/ML injection 6 mL (6 mLs Intravenous Contrast Given 07/15/21 0457)    Mobility walks with person assist High fall risk   Focused Assessments Neuro Assessment Handoff:  Swallow screen pass? Yes          Neuro Assessment:   Neuro Checks:      Last Documented NIHSS Modified Score:   Has TPA been given? No If patient is a Neuro Trauma and patient is going to OR before floor call report to [place] nurse: [phone] or [phone]   R Recommendations: See Admitting Provider Note  Report given to:   Additional Notes:

## 2021-07-16 DIAGNOSIS — R8271 Bacteriuria: Secondary | ICD-10-CM

## 2021-07-16 DIAGNOSIS — R29898 Other symptoms and signs involving the musculoskeletal system: Secondary | ICD-10-CM

## 2021-07-16 LAB — URINE CULTURE

## 2021-07-16 MED ORDER — CHLORHEXIDINE GLUCONATE CLOTH 2 % EX PADS
6.0000 | MEDICATED_PAD | Freq: Every day | CUTANEOUS | Status: DC
  Administered 2021-07-16 – 2021-08-02 (×18): 6 via TOPICAL

## 2021-07-16 NOTE — Progress Notes (Signed)
Subjective: Patient continues to feel better with energy over all and appetite No fever No leukocytosis Crp normal  Husband and grand-daughter here by bedside today. They have different views of how patient has been doing the last several months. Grand daughter is under the impression of gradual LE strength decline where as husband states it is a valley drop episodes but back to normal outside of that   Exam: Vitals:   07/16/21 0518 07/16/21 0907  BP: 112/71 106/79  Pulse: 84 (!) 102  Resp: 18 16  Temp: 97.7 F (36.5 C) 98.6 F (37 C)  SpO2: 95% 92%   General/constitutional: no distress, pleasant HEENT: Normocephalic, PER, Conj Clear, EOMI, Oropharynx clear Neck supple CV: rrr no mrg Lungs: clear to auscultation, normal respiratory effort Abd: Soft, Nontender Ext: no edema Skin: No Rash Neuro: bilateral LE strength 3-4/5 symmetric; no ridigity MSK: no peripheral joint swelling/tenderness/warmth; back spines nontender  Labs: Lab Results  Component Value Date   WBC 9.3 07/15/2021   HGB 12.2 07/15/2021   HCT 39.8 07/15/2021   MCV 98.3 07/15/2021   PLT 237 97/98/9211   Last metabolic panel Lab Results  Component Value Date   GLUCOSE 102 (H) 07/15/2021   NA 141 07/15/2021   K 4.0 07/15/2021   CL 103 07/15/2021   CO2 27 07/15/2021   BUN 18 07/15/2021   CREATININE 0.75 07/15/2021   GFRNONAA >60 07/15/2021   CALCIUM 9.0 07/15/2021   PHOS 3.0 02/17/2020   PROT 6.2 (L) 07/14/2021   ALBUMIN 3.2 (L) 07/14/2021   LABGLOB 2.6 03/02/2019   AGRATIO 1.6 03/02/2019   BILITOT 0.4 07/14/2021   ALKPHOS 100 07/14/2021   AST 40 07/14/2021   ALT 33 07/14/2021   ANIONGAP 11 07/15/2021      Component Value Date/Time   CRP 0.8 07/15/2021 1908   CRP 0.7 02/02/2020 1130   A/p Weakness acute on chronic/progressive? Hx encephalitis/transverse myelitis Neurogenic bladder Bacteriuria  11/11 ucx multiple species of bacteria  Again I do not suspect this is uti. She had 1 dose  abx but off for 24 hours and had improvement too quick for abx, and continue to improve  Her strength is still weak. Although there is question based on review what the course of this has been  At this time if she continues to remain clinically stable with mentation/appetite and other vitals I would think for her to have PT/ot and ongoing outpatient neurologic evaluation for what seems to be slowly progressive LE weakness with episodes of more rapid drop  I have arranged outpatient id f/u with me next week to continue addressing dx and tx of future episode of uti if need to  -pt/ot supportive care per team -- husband concern to send her out this weak without pt/ot evaluation -continue to defer abx -I am leaving her on the id list in case there is more question the next few days for ID team   Clinic Follow Up Appt: 11/18 @ 1030 with dr [facility]  @  [facility] [address] [place], [place], [place] [address] Phone: [phone]

## 2021-07-16 NOTE — Progress Notes (Signed)
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  Patient does have a history of recurrent UTIs since then typically gets bilateral lower extremity weakness with her UTIs.  Patient lives at home with her husband who is normally able to help her ambulate to and from the bathroom but for last few days she was unable to ambulate with his assistance. Patient has multiple hospitalizations for recurrent UTIs and was treated several times. Labs in the ED shows UA large,  LE many bacteria's.  Catheter was exchanged.  UA still looks infected.  Urine cultures obtained. Patient started on meropenem.  CT head and neck negative for acute abnormality.  Infectious disease consulted,  recommended to discontinue meropenem.  Assessment & Plan:   Principal Problem:   Urinary tract infection associated with catheterization of urinary tract, initial encounter [facility]) Active Problems:   Incomplete paraplegia (HCC)   Chronic pain syndrome   Generalized weakness   History of ESBL E. coli infection   History of ESBL Klebsiella pneumoniae infection   Asymptomatic bacteriuria  Catheter associated UTI-likely ESBL. Patient has chronic catheter, history of recurrent UTIs with ESBL. Started empiric meropenem. Urine cultures appears contaminated. Infectious disease consulted, recommended to discontinue antibiotics.  Monitor off antibiotics. If She develops sepsis  repeat blood cultures and restart meropenem/ vancomycin while waiting for cultures. Patient has been doing good so for, reports more improvement.  Bilateral lower extremity weakness : Incomplete paraplegia due to transverse myelitis. Acute on chronic.  This could be secondary to ESBL  UTI. Patient also has remote appearing compression fractures in thoracic and lumbar spine. MRI T-spine: healed T12 compression fracture  MRI L-spine: Multiple remote healed compression fractures  PT and OT eval pending.  Chronic pain syndrome: Continue Lyrica, Keppra, prednisone  History of DVT Continue Xarelto.    DVT prophylaxis: Xarelto Code Status: Full code Family Communication: Husband and son at bedside Disposition Plan:    Status is: Inpatient  Remains inpatient appropriate because:  Recurrent UTI requiring IV antibiotics.  Lower extremity weakness requiring PT and OT eval.  Anticipated discharge home in 1 to 2 days.   Consultants:  Infectious disease  Procedures: None Antimicrobials:   Anti-infectives (From admission, onward)    Start     Dose/Rate Route Frequency Ordered Stop   07/15/21 0000  acyclovir (ZOVIRAX) tablet 400 mg        400 mg Oral 2 times daily 07/14/21 2351     07/14/21 2200  meropenem (MERREM) 1 g in sodium chloride 0.9 % 100 mL IVPB  Status:  Discontinued        1 g 200 mL/hr over 30 Minutes Intravenous Every 8 hours 07/14/21 2143 07/15/21 1251        Subjective: Patient was seen and examined at bedside.  Overnight events noted.   Patient reports feeling better,  states energy has improved but is still feeling weak in lower extremities. Patient reports still having mild urinary burning but getting better.  Objective: Vitals:   07/15/21 2300 07/16/21 0208 07/16/21 0518 07/16/21 0907  BP: 110/70 103/86 112/71 106/79  Pulse: 88 87 84 (!) 102  Resp: 19 18 18 16   Temp: 98 F (36.7 C) 98.2 F (36.8 C) 97.7 F (36.5 C) 98.6 F (  37 C)  TempSrc: Oral Oral Oral Oral  SpO2: 94% 95% 95% 92%  Weight:        Intake/Output Summary (Last 24 hours) at 07/16/2021 1329 Last data filed at 07/16/2021 0200 Gross per 24 hour  Intake 300 ml  Output 1601 ml  Net -1301 ml   Filed Weights   07/14/21 2100  Weight: 68 kg     Examination:  General exam: Appears comfortable and calm, not in any distress,  Chronically ill looking. Respiratory system: Clear to auscultation. Respiratory effort normal.  RR 15. Cardiovascular system: S1-S2 heard, regular rate and rhythm, no murmur. Gastrointestinal system: Abdomen is soft, nontender, nondistended, BS +. Central nervous system: Alert and oriented x 3.  Bilateral lower extremity weakness Extremities: No edema, no cyanosis, no clubbing. Skin: No rashes, lesions or ulcers Psychiatry: Judgement and insight appear normal. Mood & affect appropriate.     Data Reviewed: I have personally reviewed following labs and imaging studies  CBC: Recent Labs  Lab 07/14/21 1900 07/15/21 0318  WBC 9.9 9.3  NEUTROABS 6.5  --   HGB 13.0 12.2  HCT 41.7 39.8  MCV 98.3 98.3  PLT 230 800   Basic Metabolic Panel: Recent Labs  Lab 07/14/21 1900 07/15/21 0318  NA 138 141  K 4.3 4.0  CL 102 103  CO2 26 27  GLUCOSE 121* 102*  BUN 22 18  CREATININE 0.86 0.75  CALCIUM 9.0 9.0   GFR: Estimated Creatinine Clearance: 60.9 mL/min (by C-G formula based on SCr of 0.75 mg/dL). Liver Function Tests: Recent Labs  Lab 07/14/21 1900  AST 40  ALT 33  ALKPHOS 100  BILITOT 0.4  PROT 6.2*  ALBUMIN 3.2*   Recent Labs  Lab 07/14/21 1900  LIPASE 38   No results for input(s): AMMONIA in the last 168 hours. Coagulation Profile: No results for input(s): INR, PROTIME in the last 168 hours. Cardiac Enzymes: No results for input(s): CKTOTAL, CKMB, CKMBINDEX, TROPONINI in the last 168 hours. BNP (last 3 results) No results for input(s): PROBNP in the last 8760 hours. HbA1C: No results for input(s): HGBA1C in the last 72 hours. CBG: Recent Labs  Lab 07/14/21 1904  GLUCAP 125*   Lipid Profile: No results for input(s): CHOL, HDL, LDLCALC, TRIG, CHOLHDL, LDLDIRECT in the last 72 hours. Thyroid Function Tests: No results for input(s): TSH, T4TOTAL, FREET4, T3FREE, THYROIDAB  in the last 72 hours. Anemia Panel: No results for input(s): VITAMINB12, FOLATE, FERRITIN, TIBC, IRON, RETICCTPCT in the last 72 hours. Sepsis Labs: No results for input(s): PROCALCITON, LATICACIDVEN in the last 168 hours.  Recent Results (from the past 240 hour(s))  Urine Culture     Status: Abnormal   Collection Time: 07/14/21  7:01 PM   Specimen: Urine, Catheterized  Result Value Ref Range Status   Specimen Description URINE, CATHETERIZED  Final   Special Requests   Final    NONE Performed at [facility], [address], [place], [place] [address]    Culture MULTIPLE SPECIES PRESENT, SUGGEST RECOLLECTION (A)  Final   Report Status 07/16/2021 FINAL  Final  Resp Panel by RT-PCR (Flu A&B, Covid) Nasopharyngeal Swab     Status: None   Collection Time: 07/14/21 11:30 PM   Specimen: Nasopharyngeal Swab; Nasopharyngeal(NP) swabs in vial transport medium  Result Value Ref Range Status   SARS Coronavirus 2 by RT PCR NEGATIVE NEGATIVE Final    Comment: (NOTE) SARS-CoV-2 target nucleic acids are NOT DETECTED.  The SARS-CoV-2 RNA is  generally detectable in upper respiratory specimens during the acute phase of infection. The lowest concentration of SARS-CoV-2 viral copies this assay can detect is 138 copies/mL. A negative result does not preclude SARS-Cov-2 infection and should not be used as the sole basis for treatment or other patient management decisions. A negative result may occur with  improper specimen collection/handling, submission of specimen other than nasopharyngeal swab, presence of viral mutation(s) within the areas targeted by this assay, and inadequate number of viral copies(<138 copies/mL). A negative result must be combined with clinical observations, patient history, and epidemiological information. The expected result is Negative.  Fact Sheet for Patients:  EntrepreneurPulse.com.au  Fact Sheet for Healthcare Providers:   IncredibleEmployment.be  This test is no t yet approved or cleared by the [place] FDA and  has been authorized for detection and/or diagnosis of SARS-CoV-2 by FDA under an Emergency Use Authorization (EUA). This EUA will remain  in effect (meaning this test can be used) for the duration of the COVID-19 declaration under Section 564(b)(1) of the Act, 21 U.S.C.section 360bbb-3(b)(1), unless the authorization is terminated  or revoked sooner.       Influenza A by PCR NEGATIVE NEGATIVE Final   Influenza B by PCR NEGATIVE NEGATIVE Final    Comment: (NOTE) The Xpert Xpress SARS-CoV-2/FLU/RSV plus assay is intended as an aid in the diagnosis of influenza from Nasopharyngeal swab specimens and should not be used as a sole basis for treatment. Nasal washings and aspirates are unacceptable for Xpert Xpress SARS-CoV-2/FLU/RSV testing.  Fact Sheet for Patients: EntrepreneurPulse.com.au  Fact Sheet for Healthcare Providers: IncredibleEmployment.be  This test is not yet approved or cleared by the [place] FDA and has been authorized for detection and/or diagnosis of SARS-CoV-2 by FDA under an Emergency Use Authorization (EUA). This EUA will remain in effect (meaning this test can be used) for the duration of the COVID-19 declaration under Section 564(b)(1) of the Act, 21 U.S.C. section 360bbb-3(b)(1), unless the authorization is terminated or revoked.  Performed at [facility], [place] [address], [place], [place] [address]          Radiology Studies: DG Chest 2 View  Result Date: 07/14/2021 CLINICAL DATA:  Generalized weakness EXAM: CHEST - 2 VIEW COMPARISON:  03/15/2021 FINDINGS: Lungs are well expanded, symmetric, and clear. No pneumothorax or pleural effusion. Cardiac size within normal limits. Pulmonary vascularity is normal. Osseous structures are age-appropriate. No acute bone abnormality. IMPRESSION: No  active cardiopulmonary disease. Electronically Signed   By: Fidela Salisbury M.D.   On: 07/14/2021 20:25   DG Thoracic Spine 2 View  Result Date: 07/14/2021 CLINICAL DATA:  Generalized weakness, back pain, recent fall EXAM: THORACIC SPINE 2 VIEWS COMPARISON:  None. FINDINGS: Normal thoracic kyphosis. No acute fracture or listhesis of the thoracic spine. Vertebral body height is preserved. Paraspinal soft tissues are unremarkable. L2 kyphoplasty incidentally noted. Degenerative changes are noted at C3-C6,. IMPRESSION: No acute fracture or listhesis. Electronically Signed   By: Fidela Salisbury M.D.   On: 07/14/2021 20:32   DG Lumbar Spine Complete  Result Date: 07/14/2021 CLINICAL DATA:  Weakness, recent fall, back pain EXAM: LUMBAR SPINE - COMPLETE 4+ VIEW COMPARISON:  12/19/2017 FINDINGS: Normal lumbar lordosis. 7 mm anterolisthesis of L4 upon L5 is unchanged. Interval kyphoplasty of L2 and L5. There are superior endplate fractures of L3 and L4, new since prior examination, with roughly 20-30% loss of height of the L4 vertebral body. These appear chronic in nature. No acute fracture of the  lumbar spine. Stable intervertebral disc space narrowing and endplate remodeling at W96-P5 in keeping with changes of a mild to moderate degenerative disc disease. Vascular calcifications are seen within the abdominal aorta. IMPRESSION: No acute fracture or listhesis of the a lumbar spine. Interval vertebral augmentation of L2 and L5. Interval development of remote appearing fractures of L3 and L4 with approximately 20-30% loss of height of L4. Electronically Signed   By: Fidela Salisbury M.D.   On: 07/14/2021 20:30   DG Sacrum/Coccyx  Result Date: 07/14/2021 CLINICAL DATA:  Generalized weakness, pelvic pain, recent fall EXAM: SACRUM AND COCCYX - 2+ VIEW COMPARISON:  None. FINDINGS: The osseous structures are diffusely osteopenic. No acute fracture or dislocation. L5 kyphoplasty has been performed. L3 and L4 vertebral  bodies demonstrate loss of height, not well profiled on this examination. The soft tissues are unremarkable. IMPRESSION: No acute fracture or dislocation. Electronically Signed   By: Fidela Salisbury M.D.   On: 07/14/2021 20:27   CT HEAD WO CONTRAST (5MM)  Result Date: 07/14/2021 CLINICAL DATA:  Weakness and difficulty walking. Worse today. Chronic pain all over. Fall 2 days ago. EXAM: CT HEAD WITHOUT CONTRAST CT CERVICAL SPINE WITHOUT CONTRAST TECHNIQUE: Multidetector CT imaging of the head and cervical spine was performed following the standard protocol without intravenous contrast. Multiplanar CT image reconstructions of the cervical spine were also generated. COMPARISON:  None. FINDINGS: CT HEAD FINDINGS BRAIN: BRAIN Cerebral ventricle sizes are concordant with the degree of cerebral volume loss. Patchy and confluent areas of decreased attenuation are noted throughout the deep and periventricular white matter of the cerebral hemispheres bilaterally, compatible with chronic microvascular ischemic disease. No evidence of large-territorial acute infarction. No parenchymal hemorrhage. No mass lesion. No extra-axial collection. No mass effect or midline shift. No hydrocephalus. Basilar cisterns are patent. Vascular: No hyperdense vessel. Skull: No acute fracture or focal lesion. Sinuses/Orbits: Partial effusion of the right mastoid air cells. No fluid within the right middle ear. Paranasal sinuses and left mastoid air cells are clear. Bilateral lens replacement. Otherwise orbits are unremarkable. Other: None. CT CERVICAL SPINE FINDINGS Alignment: Normal. Skull base and vertebrae: Multilevel degenerative changes of the spine. No associated severe osseous neural foraminal or central canal stenosis. No acute fracture. No aggressive appearing focal osseous lesion or focal pathologic process. Soft tissues and spinal canal: No prevertebral fluid or swelling. No visible canal hematoma. Upper chest: Unremarkable. Other:  None. IMPRESSION: 1. No acute intracranial abnormality. 2. No acute displaced fracture or traumatic listhesis of the cervical spine. 3. Partial fusion of the right mastoid air cells. Electronically Signed   By: Iven Finn M.D.   On: 07/14/2021 21:52   CT Cervical Spine Wo Contrast  Result Date: 07/14/2021 CLINICAL DATA:  Weakness and difficulty walking. Worse today. Chronic pain all over. Fall 2 days ago. EXAM: CT HEAD WITHOUT CONTRAST CT CERVICAL SPINE WITHOUT CONTRAST TECHNIQUE: Multidetector CT imaging of the head and cervical spine was performed following the standard protocol without intravenous contrast. Multiplanar CT image reconstructions of the cervical spine were also generated. COMPARISON:  None. FINDINGS: CT HEAD FINDINGS BRAIN: BRAIN Cerebral ventricle sizes are concordant with the degree of cerebral volume loss. Patchy and confluent areas of decreased attenuation are noted throughout the deep and periventricular white matter of the cerebral hemispheres bilaterally, compatible with chronic microvascular ischemic disease. No evidence of large-territorial acute infarction. No parenchymal hemorrhage. No mass lesion. No extra-axial collection. No mass effect or midline shift. No hydrocephalus. Basilar cisterns are patent. Vascular: No  hyperdense vessel. Skull: No acute fracture or focal lesion. Sinuses/Orbits: Partial effusion of the right mastoid air cells. No fluid within the right middle ear. Paranasal sinuses and left mastoid air cells are clear. Bilateral lens replacement. Otherwise orbits are unremarkable. Other: None. CT CERVICAL SPINE FINDINGS Alignment: Normal. Skull base and vertebrae: Multilevel degenerative changes of the spine. No associated severe osseous neural foraminal or central canal stenosis. No acute fracture. No aggressive appearing focal osseous lesion or focal pathologic process. Soft tissues and spinal canal: No prevertebral fluid or swelling. No visible canal hematoma.  Upper chest: Unremarkable. Other: None. IMPRESSION: 1. No acute intracranial abnormality. 2. No acute displaced fracture or traumatic listhesis of the cervical spine. 3. Partial fusion of the right mastoid air cells. Electronically Signed   By: Iven Finn M.D.   On: 07/14/2021 21:52   MR THORACIC SPINE W WO CONTRAST  Result Date: 07/15/2021 CLINICAL DATA:  Follow-up spinal cord injury EXAM: MRI THORACIC AND LUMBAR SPINE WITHOUT AND WITH CONTRAST TECHNIQUE: Multiplanar and multiecho pulse sequences of the thoracic and lumbar spine were obtained without and with intravenous contrast. CONTRAST:  50mL GADAVIST GADOBUTROL 1 MMOL/ML IV SOLN COMPARISON:  Thoracic MRI 03/15/2021 FINDINGS: MRI THORACIC SPINE FINDINGS Alignment:  Normal Vertebrae: Horizontal fracture through the T12 vertebral body without marrow edema, interval. No acute fracture. Hemangiomas seen at T2 and L1. Cord: The central canal is faintly visible intermittently in the upper thoracic cord, within normal limits. No cord edema or swelling noted. Paraspinal and other soft tissues: Negative Disc levels: Central disc protrusion on a chronic basis at T5-6. Disc narrowing and bulging at T11-12 and T12-L1. Negative facets. MRI LUMBAR SPINE FINDINGS Segmentation:  5 lumbar type vertebrae Alignment:  Grade 1 anterolisthesis at L4-5 Vertebrae: Remote compression fractures of L2-L5 with cement augmentation at L2 and L5. No acute fracture, discitis, or aggressive bone lesion Conus medullaris: Extends to the L2 level and appears normal. Paraspinal and other soft tissues: Fatty atrophy of intrinsic back muscles Disc levels: T12- L1: Disc narrowing with shallow central protrusion. L1-L2: Disc narrowing and left paracentral protrusion at an annular fissure. L2-L3: Disc narrowing and mild bilateral foraminal bulging L3-L4: Disc narrowing and bulging with mild facet spurring. Mild left foraminal narrowing L4-L5: Facet osteoarthritis with spurring and  anterolisthesis. The disc is mildly narrowed with circumferential bulging. Moderate bilateral foraminal narrowing L5-S1:Disc narrowing and left eccentric bulging at with an annular fissure. Facet spurring. IMPRESSION: Thoracic MRI: 1. Interval but healed T12 compression fracture since 03/15/2021, with mild height loss. 2. Unremarkable appearance of the cord today. Lumbar MRI: 1. No acute finding or neural compression. 2. Multiple remote compression fractures which have healed. 3. Degeneration most notable at L4-5 where there is anterolisthesis and moderate bilateral foraminal narrowing. Electronically Signed   By: Jorje Guild M.D.   On: 07/15/2021 05:00   MR Lumbar Spine W Wo Contrast  Result Date: 07/15/2021 CLINICAL DATA:  Follow-up spinal cord injury EXAM: MRI THORACIC AND LUMBAR SPINE WITHOUT AND WITH CONTRAST TECHNIQUE: Multiplanar and multiecho pulse sequences of the thoracic and lumbar spine were obtained without and with intravenous contrast. CONTRAST:  78mL GADAVIST GADOBUTROL 1 MMOL/ML IV SOLN COMPARISON:  Thoracic MRI 03/15/2021 FINDINGS: MRI THORACIC SPINE FINDINGS Alignment:  Normal Vertebrae: Horizontal fracture through the T12 vertebral body without marrow edema, interval. No acute fracture. Hemangiomas seen at T2 and L1. Cord: The central canal is faintly visible intermittently in the upper thoracic cord, within normal limits. No cord edema or swelling noted. Paraspinal  and other soft tissues: Negative Disc levels: Central disc protrusion on a chronic basis at T5-6. Disc narrowing and bulging at T11-12 and T12-L1. Negative facets. MRI LUMBAR SPINE FINDINGS Segmentation:  5 lumbar type vertebrae Alignment:  Grade 1 anterolisthesis at L4-5 Vertebrae: Remote compression fractures of L2-L5 with cement augmentation at L2 and L5. No acute fracture, discitis, or aggressive bone lesion Conus medullaris: Extends to the L2 level and appears normal. Paraspinal and other soft tissues: Fatty atrophy of  intrinsic back muscles Disc levels: T12- L1: Disc narrowing with shallow central protrusion. L1-L2: Disc narrowing and left paracentral protrusion at an annular fissure. L2-L3: Disc narrowing and mild bilateral foraminal bulging L3-L4: Disc narrowing and bulging with mild facet spurring. Mild left foraminal narrowing L4-L5: Facet osteoarthritis with spurring and anterolisthesis. The disc is mildly narrowed with circumferential bulging. Moderate bilateral foraminal narrowing L5-S1:Disc narrowing and left eccentric bulging at with an annular fissure. Facet spurring. IMPRESSION: Thoracic MRI: 1. Interval but healed T12 compression fracture since 03/15/2021, with mild height loss. 2. Unremarkable appearance of the cord today. Lumbar MRI: 1. No acute finding or neural compression. 2. Multiple remote compression fractures which have healed. 3. Degeneration most notable at L4-5 where there is anterolisthesis and moderate bilateral foraminal narrowing. Electronically Signed   By: Jorje Guild M.D.   On: 07/15/2021 05:00    Scheduled Meds:  acyclovir  400 mg Oral BID   Chlorhexidine Gluconate Cloth  6 each Topical Q0600   diazepam  5 mg Oral BID   DULoxetine  30 mg Oral q AM   DULoxetine  60 mg Oral QHS   ferrous sulfate  325 mg Oral q morning   levETIRAcetam  250 mg Oral BID   mirabegron ER  50 mg Oral QHS   omega-3 acid ethyl esters  1 g Oral q morning   pantoprazole  40 mg Oral QHS   predniSONE  5 mg Oral Q breakfast   pregabalin  100 mg Oral QHS   QUEtiapine  100 mg Oral QHS   rivaroxaban  20 mg Oral Q supper   Continuous Infusions:   LOS: 1 day    Time spent: 25 mins    Shawna Clamp, MD [facility]   If 7PM-7AM, please contact night-coverage

## 2021-07-17 DIAGNOSIS — R040 Epistaxis: Secondary | ICD-10-CM

## 2021-07-17 MED ORDER — OXYMETAZOLINE HCL 0.05 % NA SOLN
1.0000 | NASAL | Status: AC
  Administered 2021-07-18: 1 via NASAL
  Filled 2021-07-17: qty 30

## 2021-07-17 MED ORDER — SODIUM CHLORIDE 0.9 % IV BOLUS
500.0000 mL | Freq: Once | INTRAVENOUS | Status: AC
  Administered 2021-07-17: 500 mL via INTRAVENOUS

## 2021-07-17 NOTE — Progress Notes (Signed)
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  Patient does have a history of recurrent UTIs since then typically gets bilateral lower extremity weakness with her UTIs.  Patient lives at home with her husband who is normally able to help her ambulate to and from the bathroom but for last few days she was unable to ambulate with his assistance. Patient has multiple hospitalizations for recurrent UTIs and was treated several times. Labs in the ED shows UA large,  LE many bacteria's.  Catheter was exchanged.  UA still looks infected.  Urine cultures obtained. Patient started on meropenem.  CT head and neck negative for acute abnormality.  Infectious disease consulted,  recommended to discontinue meropenem.  Assessment & Plan:   Principal Problem:   Urinary tract infection associated with catheterization of urinary tract, initial encounter [facility]) Active Problems:   Incomplete paraplegia (HCC)   Chronic pain syndrome   Generalized weakness   History of ESBL E. coli infection   History of ESBL Klebsiella pneumoniae infection   Asymptomatic bacteriuria  Catheter associated UTI-likely ESBL. Patient has chronic catheter, history of recurrent UTIs with ESBL. Started empiric meropenem. Urine cultures appears contaminated. Infectious disease consulted, recommended to discontinue antibiotics.  Monitor off antibiotics. If She develops sepsis  repeat blood cultures and restart meropenem / vancomycin while waiting for cultures. Patient has been doing good so for, reports more improvement. Patient remains afebrile with no leukocytosis.  Progressive Bilateral lower extremity weakness : Incomplete paraplegia due to transverse  myelitis. Acute on chronic.  This could be secondary to ESBL UTI. Patient also has remote appearing compression fractures in thoracic and lumbar spine. MRI T-spine: healed T12 compression fracture  MRI L-spine: Multiple remote healed compression fractures  PT and OT eval recommended acute inpatient rehab.  Chronic pain syndrome: Continue Lyrica, Keppra, prednisone  History of DVT Continue Xarelto.    DVT prophylaxis: Xarelto Code Status: Full code Family Communication: Husband and son at bedside Disposition Plan:    Status is: Inpatient  Remains inpatient appropriate because:  Recurrent UTI requiring IV antibiotics.  Progressive lower extremity weakness requiring PT and OT eval. PT and OT recommended acute inpatient rehab.  Patient is medically clear,  awaiting acute inpatient rehab.   Consultants:  Infectious disease  Procedures: None Antimicrobials:   Anti-infectives (From admission, onward)    Start     Dose/Rate Route Frequency Ordered Stop   07/15/21 0000  acyclovir (ZOVIRAX) tablet 400 mg        400 mg Oral 2 times daily 07/14/21 2351     07/14/21 2200  meropenem (MERREM) 1 g in sodium chloride 0.9 % 100 mL IVPB  Status:  Discontinued        1 g 200 mL/hr over 30 Minutes Intravenous Every 8 hours 07/14/21 2143 07/15/21 1251        Subjective: Patient was seen and examined at bedside.  Overnight events noted.   Patient reports feeling better, she still reports having lower extremity weakness. Patient reports still having mild urinary burning but getting better. Husband unable to take her out of bed to the chair.  Objective: Vitals:   07/16/21 1841 07/16/21 2100 07/17/21 0644 07/17/21 0916  BP: 100/72 108/72 106/69 (!) 85/59  Pulse: 90  86 79 87  Resp: 18 18 16 19   Temp: 98.8 F (37.1 C) 98.6 F (37 C) 98 F (36.7 C) (!) 97.5 F (36.4 C)  TempSrc: Oral Oral Oral   SpO2: 93% 94% 92% 96%  Weight:        Intake/Output Summary (Last 24 hours) at  07/17/2021 1334 Last data filed at 07/17/2021 0900 Gross per 24 hour  Intake 477 ml  Output 1600 ml  Net -1123 ml   Filed Weights   07/14/21 2100  Weight: 68 kg    Examination:  General exam: Appears comfortable , Chronically ill looking.  Not in any distress. Respiratory system: Clear to auscultation. Respiratory effort normal.  RR 13. Cardiovascular system: S1-S2 heard, regular rate and rhythm, no murmur. Gastrointestinal system: Abdomen is soft, nontender, nondistended, BS +. Central nervous system: Alert and oriented x 3.  Bilateral lower extremity weakness+ Extremities: No edema, no cyanosis, no clubbing. Skin: No rashes, lesions or ulcers Psychiatry: Judgement and insight appear normal. Mood & affect appropriate.     Data Reviewed: I have personally reviewed following labs and imaging studies  CBC: Recent Labs  Lab 07/14/21 1900 07/15/21 0318  WBC 9.9 9.3  NEUTROABS 6.5  --   HGB 13.0 12.2  HCT 41.7 39.8  MCV 98.3 98.3  PLT 230 650   Basic Metabolic Panel: Recent Labs  Lab 07/14/21 1900 07/15/21 0318  NA 138 141  K 4.3 4.0  CL 102 103  CO2 26 27  GLUCOSE 121* 102*  BUN 22 18  CREATININE 0.86 0.75  CALCIUM 9.0 9.0   GFR: Estimated Creatinine Clearance: 60.9 mL/min (by C-G formula based on SCr of 0.75 mg/dL). Liver Function Tests: Recent Labs  Lab 07/14/21 1900  AST 40  ALT 33  ALKPHOS 100  BILITOT 0.4  PROT 6.2*  ALBUMIN 3.2*   Recent Labs  Lab 07/14/21 1900  LIPASE 38   No results for input(s): AMMONIA in the last 168 hours. Coagulation Profile: No results for input(s): INR, PROTIME in the last 168 hours. Cardiac Enzymes: No results for input(s): CKTOTAL, CKMB, CKMBINDEX, TROPONINI in the last 168 hours. BNP (last 3 results) No results for input(s): PROBNP in the last 8760 hours. HbA1C: No results for input(s): HGBA1C in the last 72 hours. CBG: Recent Labs  Lab 07/14/21 1904  GLUCAP 125*   Lipid Profile: No results for  input(s): CHOL, HDL, LDLCALC, TRIG, CHOLHDL, LDLDIRECT in the last 72 hours. Thyroid Function Tests: No results for input(s): TSH, T4TOTAL, FREET4, T3FREE, THYROIDAB in the last 72 hours. Anemia Panel: No results for input(s): VITAMINB12, FOLATE, FERRITIN, TIBC, IRON, RETICCTPCT in the last 72 hours. Sepsis Labs: No results for input(s): PROCALCITON, LATICACIDVEN in the last 168 hours.  Recent Results (from the past 240 hour(s))  Urine Culture     Status: Abnormal   Collection Time: 07/14/21  7:01 PM   Specimen: Urine, Catheterized  Result Value Ref Range Status   Specimen Description URINE, CATHETERIZED  Final   Special Requests   Final    NONE Performed at [facility], [address] [place], [place] [address]    Culture MULTIPLE SPECIES PRESENT, SUGGEST RECOLLECTION (A)  Final   Report Status 07/16/2021 FINAL  Final  Resp Panel by RT-PCR (Flu A&B, Covid) Nasopharyngeal Swab     Status: None   Collection Time: 07/14/21 11:30 PM   Specimen: Nasopharyngeal Swab; Nasopharyngeal(NP) swabs in vial transport medium  Result Value Ref Range Status   SARS  Coronavirus 2 by RT PCR NEGATIVE NEGATIVE Final    Comment: (NOTE) SARS-CoV-2 target nucleic acids are NOT DETECTED.  The SARS-CoV-2 RNA is generally detectable in upper respiratory specimens during the acute phase of infection. The lowest concentration of SARS-CoV-2 viral copies this assay can detect is 138 copies/mL. A negative result does not preclude SARS-Cov-2 infection and should not be used as the sole basis for treatment or other patient management decisions. A negative result may occur with  improper specimen collection/handling, submission of specimen other than nasopharyngeal swab, presence of viral mutation(s) within the areas targeted by this assay, and inadequate number of viral copies(<138 copies/mL). A negative result must be combined with clinical observations, patient history, and  epidemiological information. The expected result is Negative.  Fact Sheet for Patients:  EntrepreneurPulse.com.au  Fact Sheet for Healthcare Providers:  IncredibleEmployment.be  This test is no t yet approved or cleared by the [place] FDA and  has been authorized for detection and/or diagnosis of SARS-CoV-2 by FDA under an Emergency Use Authorization (EUA). This EUA will remain  in effect (meaning this test can be used) for the duration of the COVID-19 declaration under Section 564(b)(1) of the Act, 21 U.S.C.section 360bbb-3(b)(1), unless the authorization is terminated  or revoked sooner.       Influenza A by PCR NEGATIVE NEGATIVE Final   Influenza B by PCR NEGATIVE NEGATIVE Final    Comment: (NOTE) The Xpert Xpress SARS-CoV-2/FLU/RSV plus assay is intended as an aid in the diagnosis of influenza from Nasopharyngeal swab specimens and should not be used as a sole basis for treatment. Nasal washings and aspirates are unacceptable for Xpert Xpress SARS-CoV-2/FLU/RSV testing.  Fact Sheet for Patients: EntrepreneurPulse.com.au  Fact Sheet for Healthcare Providers: IncredibleEmployment.be  This test is not yet approved or cleared by the [place] FDA and has been authorized for detection and/or diagnosis of SARS-CoV-2 by FDA under an Emergency Use Authorization (EUA). This EUA will remain in effect (meaning this test can be used) for the duration of the COVID-19 declaration under Section 564(b)(1) of the Act, 21 U.S.C. section 360bbb-3(b)(1), unless the authorization is terminated or revoked.  Performed at [facility], [place] [address], [place], [place] [address]     Radiology Studies: No results found.  Scheduled Meds:  acyclovir  400 mg Oral BID   Chlorhexidine Gluconate Cloth  6 each Topical Q0600   diazepam  5 mg Oral BID   DULoxetine  30 mg Oral q AM   DULoxetine  60 mg Oral QHS    ferrous sulfate  325 mg Oral q morning   levETIRAcetam  250 mg Oral BID   mirabegron ER  50 mg Oral QHS   omega-3 acid ethyl esters  1 g Oral q morning   pantoprazole  40 mg Oral QHS   predniSONE  5 mg Oral Q breakfast   pregabalin  100 mg Oral QHS   QUEtiapine  100 mg Oral QHS   rivaroxaban  20 mg Oral Q supper   Continuous Infusions:   LOS: 2 days    Time spent: 25 mins    Alise Calais, MD [facility]   If 7PM-7AM, please contact night-coverage

## 2021-07-17 NOTE — Progress Notes (Signed)
Katelyn Lamb for Infectious Disease  Date of Admission:  07/14/2021   Total days of inpatient antibiotics 2  Principal Problem:   Urinary tract infection associated with catheterization of urinary tract, initial encounter [facility]) Active Problems:   Incomplete paraplegia (HCC)   Chronic pain syndrome   Generalized weakness   History of ESBL E. coli infection   History of ESBL Klebsiella pneumoniae infection   Asymptomatic bacteriuria          Assessment: #Hx of transverse encephalitis/myelitis #Concern for UTI in a pt with suprapubic catheter -Urine Cx on 11/11 + multiple species suggest recollection.   Recommendations: -Hold antibiotics as do not think pt has a UTI, suspect underlying weakness is in the setting of neurological condition.  -Discussed with pt and husband that she is likely colonized with bacteria given her suprapubic catheter, as such other findings such as fever, abdominal pain besides weakness would point towards UTI.   -ID follow-up scheduled outpatient to discuss episodic treatment of future episodes of UTI -ID will sign off    Microbiology:   Antibiotics: Meropenem 11/11-11/12 Acyclovir 11/11-present   SUBJECTIVE: Pt is resting in bed. Reports concern for LE weakness and falls. Husband is at bedside he is concerned about the UA.   Review of Systems: ROS   Scheduled Meds:  acyclovir  400 mg Oral BID   Chlorhexidine Gluconate Cloth  6 each Topical Q0600   diazepam  5 mg Oral BID   DULoxetine  30 mg Oral q AM   DULoxetine  60 mg Oral QHS   ferrous sulfate  325 mg Oral q morning   levETIRAcetam  250 mg Oral BID   mirabegron ER  50 mg Oral QHS   omega-3 acid ethyl esters  1 g Oral q morning   pantoprazole  40 mg Oral QHS   predniSONE  5 mg Oral Q breakfast   pregabalin  100 mg Oral QHS   QUEtiapine  100 mg Oral QHS   rivaroxaban  20 mg Oral Q supper   Continuous Infusions: PRN Meds:.acetaminophen **OR** acetaminophen, docusate  sodium, HYDROcodone-acetaminophen, ondansetron **OR** ondansetron (ZOFRAN) IV, polyethylene glycol Allergies  Allergen Reactions   Demerol [Meperidine] Other (See Comments)    Hallucinations   Percocet [Oxycodone-Acetaminophen] Itching   Amoxicillin-Pot Clavulanate Diarrhea    Severe pain, headache, intestinal infection   Penicillins Itching and Rash    Has patient had a PCN reaction causing immediate rash, facial/tongue/throat swelling, SOB or lightheadedness with hypotension:  NO Has patient had a PCN reaction causing severe rash involving mucus membranes or skin necrosis: No Has patient had a PCN reaction that required hospitalization: No Has patient had a PCN reaction occurring within the last 10 years: Yes If all of the above answers are "NO", then may proceed with Cephalosporin use.    OBJECTIVE: Vitals:   07/17/21 0644 07/17/21 0916 07/17/21 1452 07/17/21 1704  BP: 106/69 (!) 85/59 114/72 105/72  Pulse: 79 87 87 86  Resp: 16 19 19 19   Temp: 98 F (36.7 C) (!) 97.5 F (36.4 C)  98.6 F (37 C)  TempSrc: Oral   Oral  SpO2: 92% 96% 92% 93%  Weight:       Body mass index is 23.48 kg/m.  Physical Exam Constitutional:      Appearance: Normal appearance.  HENT:     Head: Normocephalic and atraumatic.     Right Ear: Tympanic membrane normal.     Left Ear: Tympanic  membrane normal.     Nose: Nose normal.     Mouth/Throat:     Mouth: Mucous membranes are moist.  Eyes:     Extraocular Movements: Extraocular movements intact.     Conjunctiva/sclera: Conjunctivae normal.     Pupils: Pupils are equal, round, and reactive to light.  Cardiovascular:     Rate and Rhythm: Normal rate and regular rhythm.     Heart sounds: No murmur heard.   No friction rub. No gallop.  Pulmonary:     Effort: Pulmonary effort is normal.     Breath sounds: Normal breath sounds.  Abdominal:     General: Abdomen is flat.     Palpations: Abdomen is soft.  Skin:    General: Skin is warm and  dry.  Neurological:     General: No focal deficit present.     Mental Status: She is alert and oriented to person, place, and time.  Psychiatric:        Mood and Affect: Mood normal.      Lab Results Lab Results  Component Value Date   WBC 9.3 07/15/2021   HGB 12.2 07/15/2021   HCT 39.8 07/15/2021   MCV 98.3 07/15/2021   PLT 237 07/15/2021    Lab Results  Component Value Date   CREATININE 0.75 07/15/2021   BUN 18 07/15/2021   NA 141 07/15/2021   K 4.0 07/15/2021   CL 103 07/15/2021   CO2 27 07/15/2021    Lab Results  Component Value Date   ALT 33 07/14/2021   AST 40 07/14/2021   ALKPHOS 100 07/14/2021   BILITOT 0.4 07/14/2021        Laurice Record, [place] [facility] 07/17/2021, 10:33 PM

## 2021-07-17 NOTE — Progress Notes (Signed)
  X-cover Note: Called to bedside by RN due to massive left nare nosebleed. Pt with hx of nosebleeds. Is on xarelto. Despite 10-15 minutes of constant direct pressure, pt still bleeding from left nare.  Medium rhinorocket placed into left nare without difficulty. 5 ml of NS placed onto rhinorocket to assist in expansion of material.  RN and patient instructed not to remove rhinorocket for next 2-3 days.  If she continues to bleed tonight, she may need ENT consult for bedside chemical cautery(I.e. silver nitrate).   Kristopher Oppenheim, DO [facility]

## 2021-07-17 NOTE — Progress Notes (Signed)
Inpatient Rehabilitation Admissions Coordinator   I met at bedside with patient and her spouse. She was previously at [facility] in 2018. We discussed goals and expectations of a possible CIR admit. They prefer CIR. I will begin Auth with Health team advantage for a possible admit pending insurance approval.  Danne Baxter, RN, MSN Rehab Admissions Coordinator [phone] 07/17/2021 2:43 PM

## 2021-07-17 NOTE — Progress Notes (Signed)
  X-cover Note: Bedside RN reports pt still bleeding from left nare. I have exhausted my expertise on managing epistaxis as a hospitalist. Have discussed case with Dr. Caldwell(on-call for ENT). He states will see patient as soon as possible after he is done dealing with emergency situation in the ER.   Kristopher Oppenheim, DO [facility]

## 2021-07-17 NOTE — Evaluation (Signed)
Occupational Therapy Evaluation Patient Details Name: Katelyn Lamb MRN: [id number] DOB: 1946-06-27 Today's Date: 07/17/2021   History of Present Illness Pt is a 75 y.o. F who presents 07/14/2021 with working diagnosis of catheter associated UTI - likely EBSL and increasing bilateral lower extremity weakness. 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   Clinical Impression   Truly required assistance for ADLs and mobility PTA, she lives with her husband in a 2 level home, 3 STE. Pt is now limited by BLE weakness and BUE sensory motor deficits. She required max A for mobility and transfers, and up to max A +2 for ADLs. Pt ws Ox4 but required increased cues and time for all commands and problem solving. Pt will benefit from OT acutely. Recommend d/c to CIR to progress pt back to her baseline in order to reduce caregiver burden.      Recommendations for follow up therapy are one component of a multi-disciplinary discharge planning process, led by the attending physician.  Recommendations may be updated based on patient status, additional functional criteria and insurance authorization.   Follow Up Recommendations  Acute inpatient rehab (3hours/day)    Assistance Recommended at Discharge Frequent or constant Supervision/Assistance  Functional Status Assessment  Patient has had a recent decline in their functional status and/or demonstrates limited ability to make significant improvements in function in a reasonable and predictable amount of time  Equipment Recommendations  None recommended by OT    Recommendations for Other Services Rehab consult     Precautions / Restrictions Precautions Precautions: Fall Restrictions Weight Bearing Restrictions: No      Mobility Bed Mobility Overal bed mobility: Needs Assistance Bed Mobility: Rolling;Sidelying to Sit;Sit to  Supine Rolling: Mod assist Sidelying to sit: Mod assist   Sit to supine: Mod assist   General bed mobility comments: pt required verbal cues for sequencing bed mobility tasks, +moderate physical assist for management of BLE and trunk    Transfers Overall transfer level: Needs assistance                 General transfer comment: pt declined transfer this session due to just getting back to bed from recliner      Balance Overall balance assessment: Needs assistance Sitting-balance support: Feet unsupported Sitting balance-Leahy Scale: Poor                                     ADL either performed or assessed with clinical judgement   ADL Overall ADL's : Needs assistance/impaired Eating/Feeding: Set up;Bed level Eating/Feeding Details (indicate cue type and reason): wtih built up foam Grooming: Minimal assistance;Bed level   Upper Body Bathing: Moderate assistance;Bed level   Lower Body Bathing: Maximal assistance;+2 for physical assistance;+2 for safety/equipment;Sit to/from stand   Upper Body Dressing : Moderate assistance   Lower Body Dressing: Maximal assistance;+2 for physical assistance;+2 for safety/equipment;Sit to/from stand   Toilet Transfer: Maximal assistance;+2 for physical assistance;+2 for safety/equipment;Stand-pivot;BSC/3in1   Toileting- Clothing Manipulation and Hygiene: Maximal assistance;+2 for safety/equipment;+2 for physical assistance;Sit to/from stand       Functional mobility during ADLs: Maximal assistance;+2 for physical assistance;+2 for safety/equipment General ADL Comments: max A +2 for all standing tasks for balance, weakness and to complete functional aspect, pt with fair sitting balance but requires assist for UB tasks for safety and due to limited  ROM and strength of bilat hands     Vision Baseline Vision/History: 1 Wears glasses Ability to See in Adequate Light: 0 Adequate Patient Visual Report: No change from  baseline Vision Assessment?: No apparent visual deficits     Perception     Praxis      Pertinent Vitals/Pain Pain Assessment: No/denies pain     Hand Dominance Right   Extremity/Trunk Assessment Upper Extremity Assessment Upper Extremity Assessment: RUE deficits/detail;LUE deficits/detail RUE Deficits / Details: limited ROM, grip strength is slightly dimished but overall WFL. RUE Sensation: decreased light touch RUE Coordination: decreased fine motor;decreased gross motor LUE Deficits / Details: Poor coordination, unable to complete thumb to finger task. Husband states she must always "look" at her hands when carrying something or else she will drop it. grip strength is 3/5 LUE Sensation: decreased light touch LUE Coordination: decreased fine motor;decreased gross motor   Lower Extremity Assessment Lower Extremity Assessment: Overall WFL for tasks assessed;Defer to PT evaluation   Cervical / Trunk Assessment Cervical / Trunk Assessment: Kyphotic   Communication Communication Communication: No difficulties   Cognition Arousal/Alertness: Awake/alert Behavior During Therapy: Flat affect Overall Cognitive Status: Impaired/Different from baseline Area of Impairment: Following commands;Problem solving                       Following Commands: Follows one step commands with increased time     Problem Solving: Slow processing;Decreased initiation;Requires verbal cues General Comments: pt Ox4 this session, required incrased time and cues for all functional tasks. Pt with poor problem solving for bed mobility     General Comments  VSS on RA, pt's husband present and supportive. pt on bed pan upon arrival, no BM    Exercises     Shoulder Instructions      Home Living Family/patient expects to be discharged to:: Private residence Living Arrangements: Spouse/significant other Available Help at Discharge: Available 24 hours/day;Family Type of Home: House Home  Access: Stairs to enter [employer] of Steps: 3 Entrance Stairs-Rails: None Home Layout: Able to live on main level with bedroom/bathroom;Two level     Bathroom Shower/Tub: Teacher, early years/pre: Standard Bathroom Accessibility: Yes   Home Equipment: Tub Physiological scientist (2 wheels);BSC/3in1;Grab bars - tub/shower          Prior Functioning/Environment Prior Level of Function : Needs assist             Mobility Comments: Pt requiring assist for all transfers, typically a limited household ambulator, however pt spouse reports in the past few days he has had increased difficulty transferring her to and from [facility] ADLs Comments: Able to self feed with use of built up handles, requires assist for other ADL's        OT Problem List: Decreased strength;Decreased range of motion;Decreased activity tolerance;Impaired balance (sitting and/or standing);Decreased coordination;Decreased safety awareness;Decreased knowledge of precautions;Pain      OT Treatment/Interventions: Self-care/ADL training;Therapeutic exercise;Therapeutic activities;Patient/family education;Balance training;Modalities    OT Goals(Current goals can be found in the care plan section) Acute Rehab OT Goals Patient Stated Goal: to get stronger OT Goal Formulation: With patient Time For Goal Achievement: 07/31/21 Potential to Achieve Goals: Fair ADL Goals Pt Will Perform Grooming: with set-up;sitting Pt Will Perform Lower Body Bathing: with min assist;sit to/from stand Pt Will Perform Lower Body Dressing: with min assist;sit to/from stand Pt Will Transfer to Toilet: with min assist;stand pivot transfer;bedside commode Pt Will Perform Tub/Shower Transfer: with min assist;tub bench;rolling walker  OT Frequency: Min 2X/week   Barriers to D/C:            Co-evaluation              AM-PAC OT "6 Clicks" Daily Activity     Outcome Measure Help from another person eating meals?: A  Little Help from another person taking care of personal grooming?: A Little Help from another person toileting, which includes using toliet, bedpan, or urinal?: A Lot Help from another person bathing (including washing, rinsing, drying)?: A Lot Help from another person to put on and taking off regular upper body clothing?: A Lot Help from another person to put on and taking off regular lower body clothing?: A Lot 6 Click Score: 14   End of Session Nurse Communication: Mobility status;Patient requests pain meds  Activity Tolerance: Patient tolerated treatment well Patient left: in bed;with call bell/phone within reach;with bed alarm set;with family/visitor present  OT Visit Diagnosis: Unsteadiness on feet (R26.81);Other abnormalities of gait and mobility (R26.89);Muscle weakness (generalized) (M62.81);Pain                Time: 1355-1417 OT Time Calculation (min): 22 min Charges:  OT General Charges $OT Visit: 1 Visit OT Evaluation $OT Eval Moderate Complexity: 1 Mod   [place]ston Shawgo A Eusevio Schriver 07/17/2021, 2:35 PM

## 2021-07-17 NOTE — TOC Initial Note (Signed)
Transition of Care [facility]) - Initial/Assessment Note    Patient Details  Name: Katelyn Lamb MRN: [id number] Date of Birth: 10/17/1945  [facility] CM/SW Contact:    Tom-Johnson, Renea Ee, RN Phone Number: 07/17/2021, 3:33 PM  Clinical Narrative:                 CM spoke with patient and husband, who was feeding her breakfast at bedside about needs for post hospital transition. Presented with lower extremities weakness and UTI symptoms. Patient has history of recurring UTI's, neurogenic bladder, incomplete paraplegia and has a suprapubic catheter. Patient states she lives at home with her husband, has two children and six grand children. States her husband helps with her care at home and drives her to and from her appointments. Has a walker, wheelchair, shower chair at home. PT/OT recommended CIR and CM spoke with Pamala Hurry and she states she will begin [employer] with [employer]. PCP is Dibas Dorthy Cooler, MD and [facility] [address]. Medical workup continues. CM will continue with needs.       Expected Discharge Plan: IP Rehab Facility Barriers to Discharge: Continued Medical Work up   Patient Goals and CMS Choice Patient states their goals for this hospitalization and ongoing recovery are:: To go to rehab and go home. CMS Medicare.gov Compare Post Acute Care list provided to:: Patient Choice offered to / list presented to : Patient, Spouse  Expected Discharge Plan and Services Expected Discharge Plan: [place]   Discharge Planning Services: CM Consult Post Acute Care Choice: IP Rehab Living arrangements for the past 2 months: Single Family Home                                      Prior Living Arrangements/Services Living arrangements for the past 2 months: Single Family Home Lives with:: Spouse Patient language and need for interpreter reviewed:: Yes Do you feel safe going back to the place where you live?: Yes       Need for Family Participation in Patient Care: Yes (Comment) Care giver support system in place?: Yes (comment) Current home services: DME (walker, wheelchair, shower chair with back.) Criminal Activity/Legal Involvement Pertinent to Current Situation/Hospitalization: No - Comment as needed  Activities of Daily Living      Permission Sought/Granted Permission sought to share information with : Case Manager, Customer service manager, Family Supports Permission granted to share information with : Yes, Verbal Permission Granted              Emotional Assessment Appearance:: Appears stated age Attitude/Demeanor/Rapport: Engaged Affect (typically observed): Accepting, Appropriate, Calm, Hopeful Orientation: : Oriented to Self, Oriented to Place, Oriented to  Time, Oriented to Situation Alcohol / Substance Use: Not Applicable Psych Involvement: No (comment)  Admission diagnosis:  Generalized weakness [R53.1] Urinary tract infection associated with cystostomy catheter, initial encounter ([place]) [T83.510A, N39.0] Patient Active Problem List   Diagnosis Date Noted   Asymptomatic bacteriuria    History of ESBL E. coli infection 07/14/2021   History of ESBL Klebsiella pneumoniae infection 07/14/2021   Generalized weakness 03/15/2021   Acute cystitis with hematuria 03/15/2021   Encephalopathy 03/15/2021   Recurrent falls 03/15/2021   Right pulmonary embolus ([place]) 02/12/2020   Lower extremity weakness 02/02/2020   Macrocytic anemia 02/02/2020   Spinal stenosis 02/02/2020   History of recurrent UTI (urinary tract infection)    Bacterial infection  due to Klebsiella pneumoniae    Laceration of left hand 12/09/2019   DNR (do not resuscitate) 12/09/2019   Pelvic fracture ([place]) 12/06/2019   AKI (acute kidney injury) ([place]) 12/06/2019   Urinary tract infection associated with catheterization of urinary tract, initial encounter ([place]) 07/23/2019   Hematuria 07/23/2019    Hypokalemia 07/23/2019   Acute on chronic anemia 07/23/2019   Fever 07/23/2019   Leukocytosis 07/23/2019   Sepsis ([place]) 07/23/2019   Generalized anxiety disorder 06/17/2019   Menopausal sweats 06/17/2019   Memory difficulty 03/02/2019   Degenerative spondylolisthesis 09/02/2018   Delirium 03/26/2018   Degeneration of lumbar intervertebral disc 11/08/2017   Lumbar radiculopathy 11/06/2017   Chronic neck pain 09/12/2017   Chronic low back pain 09/06/2017   Chronic pain syndrome 09/06/2017   Dilated pancreatic duct 01/24/2017   DVT, lower extremity, distal, chronic ([place]) 01/22/2017   Elevated serum GGT level 01/22/2017   Medication monitoring encounter 01/08/2017   Neurologic gait dysfunction 11/14/2016   Hypotension due to drugs    Urinary retention    Anxiety about health    Reactive depression    Ataxia    Abdominal spasms    Constipation due to pain medication    Acute lower UTI    Dysuria    Acute deep vein thrombosis (DVT) of popliteal vein of left lower extremity (HCC)    Incomplete paraplegia (HCC)    Acute blood loss anemia    Neurogenic bladder    Neuropathic pain    Muscle spasm    Gastroesophageal reflux disease    Slow transit constipation    Thrombocytopenia (HCC) 10/03/2016   Abnormal MRI, spinal cord    Encephalomyelitis    Numbness    Intractable back pain 09/20/2016   Numbness of left lower extremity 09/20/2016   Hyponatremia 09/20/2016   Herpes zoster without complication 34/19/3790   Spondylosis of cervical region without myelopathy or radiculopathy 06/16/2015   PCP:  [name], MD Pharmacy:   [id number] [place], [place] [address] AT [address] [place] [address] Phone: [phone] Fax: [phone]     Social Determinants of Health (SDOH) Interventions    Readmission Risk Interventions No flowsheet data found.

## 2021-07-17 NOTE — Progress Notes (Signed)
Inpatient Rehab Admissions Coordinator:  ? ?Per therapy recommendations,  patient was screened for CIR candidacy by Bartlett Enke, MS, CCC-SLP. At this time, Pt. Appears to be a a potential candidate for CIR. I will place   order for rehab consult per protocol for full assessment. Please contact me any with questions. ? ?Ezme Duch, MS, CCC-SLP ?Rehab Admissions Coordinator  ?[phone] (celll) ?[phone] (office) ? ?

## 2021-07-17 NOTE — Evaluation (Addendum)
Physical Therapy Evaluation Patient Details Name: Katelyn Lamb MRN: [id number] DOB: Jul 06, 1946 Today's Date: 07/17/2021  History of Present Illness  Pt is a 75 y.o. F who presents 07/14/2021 with working diagnosis of catheter associated UTI - likely EBSL and increasing bilateral lower extremity weakness. 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  Clinical Impression  PTA, pt lives with her spouse, is typically a limited household ambulator using a Rollator with assist, and requires assist for ADL's. Pt presents with decreased functional strength, poor sitting/standing balance, and truncal weakness. Pt requiring mod assist for bed mobility and maximal assist for low pivot transfers. She is unable to stand upright. Discussed with pt spouse via phone; pt spouse reports over the past few days, he has had increasing difficulty transferring pt out of bed. He would like her to be able to get to a level of mobility where he can safely assist her. Pt would benefit from post acute rehab to address deficits, maximize functional mobility and decrease caregiver burden.      Recommendations for follow up therapy are one component of a multi-disciplinary discharge planning process, led by the attending physician.  Recommendations may be updated based on patient status, additional functional criteria and insurance authorization.  Follow Up Recommendations Acute inpatient rehab (3hours/day)    Assistance Recommended at Discharge Frequent or constant Supervision/Assistance  Functional Status Assessment Patient has had a recent decline in their functional status and demonstrates the ability to make significant improvements in function in a reasonable and predictable amount of time.   Equipment Recommendations  Hospital bed;Other (comment) (hoyer lift)    Recommendations for Other Services  Rehab consult     Precautions / Restrictions Precautions Precautions: Fall Restrictions Weight Bearing Restrictions: No      Mobility  Bed Mobility Overal bed mobility: Needs Assistance Bed Mobility: Supine to Sit     Supine to sit: Mod assist     General bed mobility comments: Use of bed pad to pivot hips, assist at trunk to upright    Transfers Overall transfer level: Needs assistance Equipment used: None;Rolling walker (2 wheels) Transfers: Sit to/from Stand;Bed to chair/wheelchair/BSC Sit to Stand: Max assist     Squat pivot transfers: Max assist     General transfer comment: Initially performed sit to stand from edge of bed to walker, pt with heavy posterior lean and increased hip flexion; unable to correct. Sat back down then performed face to face low pivot transfer to right with cues for holding onto PT elbows and head/hip relationship    Ambulation/Gait               General Gait Details: unable  Stairs            Wheelchair Mobility    Modified Rankin (Stroke Patients Only)       Balance Overall balance assessment: Needs assistance Sitting-balance support: Feet unsupported Sitting balance-Leahy Scale: Poor Sitting balance - Comments: posterior lean, requires up to minA to correct   Standing balance support: Bilateral upper extremity supported Standing balance-Leahy Scale: Zero                               Pertinent Vitals/Pain Pain Assessment: No/denies pain    Home Living Family/patient expects to be discharged to:: Private residence Living Arrangements: Spouse/significant other Available Help at Discharge: Available 24 hours/day;Family Type of  Home: House Home Access: Stairs to enter Entrance Stairs-Rails: None Entrance Stairs-Number of Steps: 3   Home Layout: Able to live on main level with bedroom/bathroom;Two level        Prior Function Prior Level of Function : Needs assist             Mobility  Comments: Pt requiring assist for all transfers, typically a limited household ambulator, however pt spouse reports in the past few days he has had increased difficulty transferring her to and from [facility] ADLs Comments: Able to self feed, requires assist for other ADL's     Hand Dominance   Dominant Hand: Right    Extremity/Trunk Assessment   Upper Extremity Assessment Upper Extremity Assessment: Defer to OT evaluation    Lower Extremity Assessment Lower Extremity Assessment: RLE deficits/detail;LLE deficits/detail RLE Deficits / Details: grossly 4/5, ankle dorsiflexion 2/5 LLE Deficits / Details: grossly 4/5, ankle dorsiflexion 2/5       Communication   Communication: No difficulties  Cognition Arousal/Alertness: Awake/alert Behavior During Therapy: Flat affect Overall Cognitive Status: Impaired/Different from baseline Area of Impairment: Orientation;Following commands;Problem solving                 Orientation Level: Disoriented to;Time     Following Commands: Follows one step commands with increased time     Problem Solving: Slow processing;Decreased initiation;Requires verbal cues;Difficulty sequencing;Requires tactile cues General Comments: Pt not oriented to time, stating it was 2001. Reports she has difficulty "remembering the 2000's." Pt with slower processing        General Comments      Exercises     Assessment/Plan    PT Assessment Patient needs continued PT services  PT Problem List Decreased strength;Decreased activity tolerance;Decreased balance;Decreased mobility;Decreased cognition;Decreased safety awareness       PT Treatment Interventions DME instruction;Gait training;Functional mobility training;Therapeutic activities;Therapeutic exercise;Balance training;Patient/family education    PT Goals (Current goals can be found in the Care Plan section)  Acute Rehab PT Goals Patient Stated Goal: pt spouse would like pt to be able to get to a  level where he can assist her PT Goal Formulation: With patient/family Time For Goal Achievement: 07/31/21 Potential to Achieve Goals: Good    Frequency Min 3X/week   Barriers to discharge        Co-evaluation               AM-PAC PT "6 Clicks" Mobility  Outcome Measure Help needed turning from your back to your side while in a flat bed without using bedrails?: A Lot Help needed moving from lying on your back to sitting on the side of a flat bed without using bedrails?: A Lot Help needed moving to and from a bed to a chair (including a wheelchair)?: A Lot Help needed standing up from a chair using your arms (e.g., wheelchair or bedside chair)?: Total Help needed to walk in hospital room?: Total Help needed climbing 3-5 steps with a railing? : Total 6 Click Score: 9    End of Session Equipment Utilized During Treatment: Gait belt Activity Tolerance: Patient tolerated treatment well Patient left: in chair;with call bell/phone within reach;with chair alarm set Nurse Communication: Mobility status PT Visit Diagnosis: Unsteadiness on feet (R26.81);Muscle weakness (generalized) (M62.81);History of falling (Z91.81);Difficulty in walking, not elsewhere classified (R26.2)    Time: 0350-0938 PT Time Calculation (min) (ACUTE ONLY): 21 min   Charges:   PT Evaluation $PT Eval Moderate Complexity: 1 Mod  Wyona Almas, PT, DPT Acute Rehabilitation Services Pager [phone] Office [phone]   Deno Etienne 07/17/2021, 10:08 AM

## 2021-07-17 NOTE — Plan of Care (Signed)

## 2021-07-18 ENCOUNTER — Inpatient Hospital Stay (HOSPITAL_COMMUNITY): Payer: PPO

## 2021-07-18 ENCOUNTER — Encounter (HOSPITAL_COMMUNITY)
Admission: EM | Disposition: A | Discharge status: Skilled Nursing Facility | Payer: Self-pay | Source: Home / Self Care | Attending: Family Medicine

## 2021-07-18 ENCOUNTER — Inpatient Hospital Stay (HOSPITAL_COMMUNITY): Payer: PPO | Admitting: Anesthesiology

## 2021-07-18 DIAGNOSIS — R040 Epistaxis: Secondary | ICD-10-CM | POA: Diagnosis not present

## 2021-07-18 DIAGNOSIS — Z7902 Long term (current) use of antithrombotics/antiplatelets: Secondary | ICD-10-CM | POA: Diagnosis not present

## 2021-07-18 HISTORY — PX: IR NEURO EACH ADD'L AFTER BASIC UNI LEFT (MS): IMG5373

## 2021-07-18 HISTORY — PX: IR ANGIO EXTERNAL CAROTID SEL EXT CAROTID BILAT MOD SED: IMG5372

## 2021-07-18 HISTORY — PX: RADIOLOGY WITH ANESTHESIA: SHX6223

## 2021-07-18 HISTORY — PX: IR ANGIO INTRA EXTRACRAN SEL INTERNAL CAROTID BILAT MOD SED: IMG5363

## 2021-07-18 HISTORY — PX: IR US GUIDE VASC ACCESS RIGHT: IMG2390

## 2021-07-18 HISTORY — PX: IR NEURO EACH ADD'L AFTER BASIC UNI RIGHT (MS): IMG5374

## 2021-07-18 HISTORY — PX: IR TRANSCATH/EMBOLIZ: IMG695

## 2021-07-18 LAB — POCT I-STAT 7, (LYTES, BLD GAS, ICA,H+H)
Acid-Base Excess: 2 mmol/L (ref 0.0–2.0)
Bicarbonate: 27.4 mmol/L (ref 20.0–28.0)
Calcium, Ion: 1.12 mmol/L — ABNORMAL LOW (ref 1.15–1.40)
HCT: 30 % — ABNORMAL LOW (ref 36.0–46.0)
Hemoglobin: 10.2 g/dL — ABNORMAL LOW (ref 12.0–15.0)
O2 Saturation: 100 %
Patient temperature: 96.2
Potassium: 3.9 mmol/L (ref 3.5–5.1)
Sodium: 137 mmol/L (ref 135–145)
TCO2: 29 mmol/L (ref 22–32)
pCO2 arterial: 41.3 mmHg (ref 32.0–48.0)
pH, Arterial: 7.424 (ref 7.350–7.450)
pO2, Arterial: 450 mmHg — ABNORMAL HIGH (ref 83.0–108.0)

## 2021-07-18 LAB — BPAM FFP
Blood Product Expiration Date: 202211202359
Blood Product Expiration Date: 202211202359
ISSUE DATE / TIME: 202211150307
ISSUE DATE / TIME: 202211150307
Unit Type and Rh: 5100
Unit Type and Rh: 5100

## 2021-07-18 LAB — PREPARE FRESH FROZEN PLASMA

## 2021-07-18 LAB — CBC
HCT: 35.8 % — ABNORMAL LOW (ref 36.0–46.0)
HCT: 34.6 % — ABNORMAL LOW (ref 36.0–46.0)
HCT: 32.6 % — ABNORMAL LOW (ref 36.0–46.0)
Hemoglobin: 11.5 g/dL — ABNORMAL LOW (ref 12.0–15.0)
Hemoglobin: 11.1 g/dL — ABNORMAL LOW (ref 12.0–15.0)
Hemoglobin: 10.4 g/dL — ABNORMAL LOW (ref 12.0–15.0)
MCH: 30.4 pg (ref 26.0–34.0)
MCH: 30.3 pg (ref 26.0–34.0)
MCH: 30.1 pg (ref 26.0–34.0)
MCHC: 32.1 g/dL (ref 30.0–36.0)
MCHC: 32.1 g/dL (ref 30.0–36.0)
MCHC: 31.9 g/dL (ref 30.0–36.0)
MCV: 94.7 fL (ref 80.0–100.0)
MCV: 94.5 fL (ref 80.0–100.0)
MCV: 94.5 fL (ref 80.0–100.0)
Platelets: 267 10*3/uL (ref 150–400)
Platelets: 294 10*3/uL (ref 150–400)
Platelets: 300 10*3/uL (ref 150–400)
RBC: 3.78 MIL/uL — ABNORMAL LOW (ref 3.87–5.11)
RBC: 3.66 MIL/uL — ABNORMAL LOW (ref 3.87–5.11)
RBC: 3.45 MIL/uL — ABNORMAL LOW (ref 3.87–5.11)
RDW: 15.9 % — ABNORMAL HIGH (ref 11.5–15.5)
RDW: 15.9 % — ABNORMAL HIGH (ref 11.5–15.5)
RDW: 15.9 % — ABNORMAL HIGH (ref 11.5–15.5)
WBC: 17.5 10*3/uL — ABNORMAL HIGH (ref 4.0–10.5)
WBC: 18.6 10*3/uL — ABNORMAL HIGH (ref 4.0–10.5)
WBC: 22 10*3/uL — ABNORMAL HIGH (ref 4.0–10.5)
nRBC: 0 % (ref 0.0–0.2)
nRBC: 0 % (ref 0.0–0.2)
nRBC: 0 % (ref 0.0–0.2)

## 2021-07-18 LAB — CBC WITH DIFFERENTIAL/PLATELET
Abs Immature Granulocytes: 0.11 10*3/uL — ABNORMAL HIGH (ref 0.00–0.07)
Basophils Absolute: 0.1 10*3/uL (ref 0.0–0.1)
Basophils Relative: 1 %
Eosinophils Absolute: 0.3 10*3/uL (ref 0.0–0.5)
Eosinophils Relative: 2 %
HCT: 41 % (ref 36.0–46.0)
Hemoglobin: 12.9 g/dL (ref 12.0–15.0)
Immature Granulocytes: 1 %
Lymphocytes Relative: 37 %
Lymphs Abs: 4.7 10*3/uL — ABNORMAL HIGH (ref 0.7–4.0)
MCH: 30.6 pg (ref 26.0–34.0)
MCHC: 31.5 g/dL (ref 30.0–36.0)
MCV: 97.4 fL (ref 80.0–100.0)
Monocytes Absolute: 0.8 10*3/uL (ref 0.1–1.0)
Monocytes Relative: 6 %
Neutro Abs: 6.7 10*3/uL (ref 1.7–7.7)
Neutrophils Relative %: 53 %
Platelets: 259 10*3/uL (ref 150–400)
RBC: 4.21 MIL/uL (ref 3.87–5.11)
RDW: 15.9 % — ABNORMAL HIGH (ref 11.5–15.5)
WBC: 12.7 10*3/uL — ABNORMAL HIGH (ref 4.0–10.5)
nRBC: 0 % (ref 0.0–0.2)

## 2021-07-18 LAB — POCT I-STAT, CHEM 8
BUN: 34 mg/dL — ABNORMAL HIGH (ref 8–23)
Calcium, Ion: 1.14 mmol/L — ABNORMAL LOW (ref 1.15–1.40)
Chloride: 104 mmol/L (ref 98–111)
Creatinine, Ser: 0.8 mg/dL (ref 0.44–1.00)
Glucose, Bld: 115 mg/dL — ABNORMAL HIGH (ref 70–99)
HCT: 43 % (ref 36.0–46.0)
Hemoglobin: 14.6 g/dL (ref 12.0–15.0)
Potassium: 7.4 mmol/L (ref 3.5–5.1)
Sodium: 138 mmol/L (ref 135–145)
TCO2: 31 mmol/L (ref 22–32)

## 2021-07-18 LAB — BASIC METABOLIC PANEL
Anion gap: 9 (ref 5–15)
BUN: 19 mg/dL (ref 8–23)
CO2: 23 mmol/L (ref 22–32)
Calcium: 8.6 mg/dL — ABNORMAL LOW (ref 8.9–10.3)
Chloride: 105 mmol/L (ref 98–111)
Creatinine, Ser: 0.81 mg/dL (ref 0.44–1.00)
GFR, Estimated: >60 mL/min (ref >60)
Glucose, Bld: 150 mg/dL — ABNORMAL HIGH (ref 70–99)
Potassium: 3.7 mmol/L (ref 3.5–5.1)
Sodium: 137 mmol/L (ref 135–145)

## 2021-07-18 LAB — HEPARIN LEVEL (UNFRACTIONATED): Heparin Unfractionated: >1.1 IU/mL — ABNORMAL HIGH (ref 0.30–0.70)

## 2021-07-18 LAB — PROTIME-INR
INR: 1.3 — ABNORMAL HIGH (ref 0.8–1.2)
Prothrombin Time: 16.6 seconds — ABNORMAL HIGH (ref 11.4–15.2)

## 2021-07-18 LAB — ABO/RH: ABO/RH(D): O POS

## 2021-07-18 SURGERY — IR WITH ANESTHESIA
Anesthesia: General

## 2021-07-18 SURGERY — Surgical Case
Anesthesia: *Unknown

## 2021-07-18 MED ORDER — HEPARIN SODIUM (PORCINE) 1000 UNIT/ML IJ SOLN
INTRAMUSCULAR | Status: AC
  Filled 2021-07-18: qty 1

## 2021-07-18 MED ORDER — IOHEXOL 300 MG/ML  SOLN
100.0000 mL | Freq: Once | INTRAMUSCULAR | Status: AC | PRN
  Administered 2021-07-18: 70 mL via INTRA_ARTERIAL

## 2021-07-18 MED ORDER — FENTANYL CITRATE PF 50 MCG/ML IJ SOSY: 25.0000 ug | PREFILLED_SYRINGE | INTRAMUSCULAR | Status: DC | PRN

## 2021-07-18 MED ORDER — DEXAMETHASONE SODIUM PHOSPHATE 10 MG/ML IJ SOLN
INTRAMUSCULAR | Status: DC | PRN
  Administered 2021-07-18: 10 mg via INTRAVENOUS

## 2021-07-18 MED ORDER — FENTANYL CITRATE PF 50 MCG/ML IJ SOSY
25.0000 ug | PREFILLED_SYRINGE | INTRAMUSCULAR | Status: DC | PRN
  Administered 2021-07-18: 50 ug via INTRAVENOUS
  Administered 2021-07-18: 25 ug via INTRAVENOUS
  Filled 2021-07-18 (×2): qty 1

## 2021-07-18 MED ORDER — QUETIAPINE FUMARATE 100 MG PO TABS
100.0000 mg | ORAL_TABLET | Freq: Every day | ORAL | Status: DC
Start: 2021-07-18 — End: 2021-07-19
  Administered 2021-07-18: 100 mg
  Filled 2021-07-18: qty 1

## 2021-07-18 MED ORDER — PROPOFOL 1000 MG/100ML IV EMUL
INTRAVENOUS | Status: AC
  Filled 2021-07-18: qty 100

## 2021-07-18 MED ORDER — CHLORHEXIDINE GLUCONATE 0.12% ORAL RINSE (MEDLINE KIT)
15.0000 mL | Freq: Two times a day (BID) | OROMUCOSAL | Status: DC
  Administered 2021-07-18 – 2021-07-19 (×3): 15 mL via OROMUCOSAL

## 2021-07-18 MED ORDER — PROPOFOL 10 MG/ML IV BOLUS
INTRAVENOUS | Status: DC | PRN
  Administered 2021-07-18: 110 mg via INTRAVENOUS

## 2021-07-18 MED ORDER — PROTHROMBIN COMPLEX CONC HUMAN 500 UNITS IV KIT
1607.0000 [IU] | PACK | Status: AC
  Administered 2021-07-18: 1607 [IU] via INTRAVENOUS
  Filled 2021-07-18: qty 607

## 2021-07-18 MED ORDER — POLYETHYLENE GLYCOL 3350 17 G PO PACK
17.0000 g | PACK | Freq: Every day | ORAL | Status: DC
  Administered 2021-07-18: 17 g
  Filled 2021-07-18 (×2): qty 1

## 2021-07-18 MED ORDER — FENTANYL CITRATE (PF) 100 MCG/2ML IJ SOLN
INTRAMUSCULAR | Status: AC
  Filled 2021-07-18: qty 2

## 2021-07-18 MED ORDER — ACYCLOVIR 200 MG/5ML PO SUSP
400.0000 mg | Freq: Two times a day (BID) | ORAL | Status: DC
  Administered 2021-07-18 – 2021-07-19 (×3): 400 mg
  Filled 2021-07-18 (×4): qty 10

## 2021-07-18 MED ORDER — CEFAZOLIN SODIUM-DEXTROSE 2-3 GM-%(50ML) IV SOLR
INTRAVENOUS | Status: DC | PRN
  Administered 2021-07-18: 2 g via INTRAVENOUS

## 2021-07-18 MED ORDER — ROCURONIUM 10MG/ML (10ML) SYRINGE FOR MEDFUSION PUMP - OPTIME
INTRAVENOUS | Status: DC | PRN
  Administered 2021-07-18: 50 mg via INTRAVENOUS

## 2021-07-18 MED ORDER — SUCCINYLCHOLINE 20MG/ML (10ML) SYRINGE FOR MEDFUSION PUMP - OPTIME
INTRAMUSCULAR | Status: DC | PRN
  Administered 2021-07-18: 110 mg via INTRAVENOUS

## 2021-07-18 MED ORDER — LIDOCAINE HCL (CARDIAC) PF 100 MG/5ML IV SOSY
PREFILLED_SYRINGE | INTRAVENOUS | Status: DC | PRN
  Administered 2021-07-18: 40 mg via INTRAVENOUS

## 2021-07-18 MED ORDER — ONDANSETRON HCL 4 MG/2ML IJ SOLN
INTRAMUSCULAR | Status: DC | PRN
  Administered 2021-07-18: 4 mg via INTRAVENOUS

## 2021-07-18 MED ORDER — PROTHROMBIN COMPLEX CONC HUMAN 500 UNITS IV KIT
2659.0000 [IU] | PACK | Status: AC
  Administered 2021-07-18: 2659 [IU] via INTRAVENOUS
  Filled 2021-07-18: qty 561

## 2021-07-18 MED ORDER — IOHEXOL 350 MG/ML SOLN
100.0000 mL | Freq: Once | INTRAVENOUS | Status: AC | PRN
  Administered 2021-07-18: 30 mL via INTRA_ARTERIAL

## 2021-07-18 MED ORDER — VERAPAMIL HCL 2.5 MG/ML IV SOLN
INTRAVENOUS | Status: AC | PRN
  Administered 2021-07-18: 5 mg via INTRA_ARTERIAL

## 2021-07-18 MED ORDER — PROPOFOL 1000 MG/100ML IV EMUL
0.0000 ug/kg/min | INTRAVENOUS | Status: DC
  Administered 2021-07-18: 30 ug/kg/min via INTRAVENOUS
  Administered 2021-07-18: 35 ug/kg/min via INTRAVENOUS
  Administered 2021-07-19: 20 ug/kg/min via INTRAVENOUS
  Filled 2021-07-18 (×2): qty 100

## 2021-07-18 MED ORDER — ORAL CARE MOUTH RINSE
15.0000 mL | OROMUCOSAL | Status: DC
  Administered 2021-07-18 – 2021-07-20 (×18): 15 mL via OROMUCOSAL

## 2021-07-18 MED ORDER — DOCUSATE SODIUM 50 MG/5ML PO LIQD
100.0000 mg | Freq: Two times a day (BID) | ORAL | Status: DC
  Administered 2021-07-18 – 2021-07-19 (×3): 100 mg
  Filled 2021-07-18 (×3): qty 10

## 2021-07-18 MED ORDER — VITAMIN K1 10 MG/ML IJ SOLN
10.0000 mg | INTRAVENOUS | Status: AC
  Administered 2021-07-18: 10 mg via INTRAVENOUS
  Filled 2021-07-18: qty 1

## 2021-07-18 MED ORDER — PHENYLEPHRINE HCL (PRESSORS) 10 MG/ML IV SOLN
INTRAVENOUS | Status: DC | PRN
  Administered 2021-07-18 (×2): 80 ug via INTRAVENOUS

## 2021-07-18 MED ORDER — PHENYLEPHRINE HCL-NACL 20-0.9 MG/250ML-% IV SOLN
INTRAVENOUS | Status: DC | PRN
  Administered 2021-07-18: 50 ug/min via INTRAVENOUS

## 2021-07-18 MED ORDER — PREGABALIN 50 MG PO CAPS
100.0000 mg | ORAL_CAPSULE | Freq: Every day | ORAL | Status: DC
Start: 2021-07-18 — End: 2021-07-19
  Administered 2021-07-18: 100 mg
  Filled 2021-07-18: qty 2

## 2021-07-18 MED ORDER — SODIUM CHLORIDE 0.9% IV SOLUTION: Freq: Once | INTRAVENOUS | Status: DC

## 2021-07-18 MED ORDER — PROPOFOL 500 MG/50ML IV EMUL
INTRAVENOUS | Status: DC | PRN
  Administered 2021-07-18: 50 ug/kg/min via INTRAVENOUS

## 2021-07-18 MED ORDER — FERROUS SULFATE 220 (44 FE) MG/5ML PO ELIX
220.0000 mg | ORAL_SOLUTION | Freq: Every day | ORAL | Status: DC
  Administered 2021-07-18 – 2021-07-19 (×2): 220 mg
  Filled 2021-07-18 (×2): qty 5

## 2021-07-18 MED ORDER — LEVETIRACETAM 100 MG/ML PO SOLN
250.0000 mg | Freq: Two times a day (BID) | ORAL | Status: DC
  Administered 2021-07-18 – 2021-07-19 (×3): 250 mg
  Filled 2021-07-18 (×3): qty 5

## 2021-07-18 MED ORDER — DIAZEPAM 5 MG PO TABS
5.0000 mg | ORAL_TABLET | Freq: Two times a day (BID) | ORAL | Status: DC
Start: 2021-07-18 — End: 2021-07-19
  Administered 2021-07-18 – 2021-07-19 (×3): 5 mg
  Filled 2021-07-18 (×3): qty 1

## 2021-07-18 MED ORDER — PANTOPRAZOLE SODIUM 40 MG IV SOLR
40.0000 mg | Freq: Every day | INTRAVENOUS | Status: DC
  Administered 2021-07-18: 40 mg via INTRAVENOUS
  Filled 2021-07-18: qty 40

## 2021-07-18 MED ORDER — SODIUM CHLORIDE 0.9 % IV SOLN: INTRAVENOUS | Status: DC

## 2021-07-18 MED ORDER — VERAPAMIL HCL 2.5 MG/ML IV SOLN
INTRAVENOUS | Status: AC
  Filled 2021-07-18: qty 2

## 2021-07-18 MED ORDER — LACTATED RINGERS IV SOLN: INTRAVENOUS | Status: DC | PRN

## 2021-07-18 MED ORDER — LIDOCAINE HCL 1 % IJ SOLN
INTRAMUSCULAR | Status: AC
  Filled 2021-07-18: qty 20

## 2021-07-18 MED ORDER — CEFAZOLIN SODIUM-DEXTROSE 2-4 GM/100ML-% IV SOLN
INTRAVENOUS | Status: AC
  Filled 2021-07-18: qty 100

## 2021-07-18 MED ORDER — PREDNISONE 5 MG PO TABS
5.0000 mg | ORAL_TABLET | Freq: Every day | ORAL | Status: DC
Start: 2021-07-18 — End: 2021-07-19
  Administered 2021-07-18 – 2021-07-19 (×2): 5 mg
  Filled 2021-07-18 (×2): qty 1

## 2021-07-18 MED ORDER — NITROGLYCERIN 1 MG/10 ML FOR IR/CATH LAB
INTRA_ARTERIAL | Status: AC
  Filled 2021-07-18: qty 10

## 2021-07-18 MED ORDER — ONDANSETRON HCL 4 MG/2ML IJ SOLN: 4.0000 mg | Freq: Four times a day (QID) | INTRAMUSCULAR | Status: DC | PRN

## 2021-07-18 NOTE — Progress Notes (Signed)
Pt notified staff of nose bleed around 2130. Continuous pressure was applied and MD on call alerted when bleed continued.  Husband also called and he came back to the room to be with her. Both the Hospitalist and ENT doctor on call came to bedside to stop the bleeding with a rhino rocket and eventually an Epistat catheter, but the bleeding persisted.  Patient was sent to IR to cauterize bleed, and she will be transferred to ICU.  Husband in room and updated while waiting for her new room assignment.  Support from Dr. Bridgett Larsson and Cr. Marcelline Deist is much appreciated .

## 2021-07-18 NOTE — Procedures (Signed)
INTERVENTIONAL NEURORADIOLOGY BRIEF POSTPROCEDURE NOTE  DIAGNOSTIC CEREBRAL ANGIOGRAM AND ENDOVASCULAR EPISTAXIS EMBOLIZATION  Attending: Dr. Pedro Earls  Assistant: None.  Diagnosis: Epistaxis  Access site: RCFA  Access closure: Perclose proStyle  Anesthesia: GETA  Medication used: Refer to anesthesia documentation.  Complications: None.  Estimated blood loss: Negligible.  Specimen: None.  Findings: Prominent blush of the left sphenopalatine artery. Embolization performed bilateral with contour particles 150-250 mc. Post procedure angiograms showed no evidence of non target embolization with preserved choroidal blush bilaterally.  The patient tolerated the procedure well without incident or complication and is in stable condition.  PLAN: - Patient will remain intubated due to aspiration risk. - Bed rest x 4 hours s/p right common femoral puncture. - Further management per Medicine/ENT.

## 2021-07-18 NOTE — Progress Notes (Signed)
Received report at 0700 from [employer], [place]. At that time, the patient was still copiously bleeding from her nose and her urostomy was leaking quite a bit and soaked the bed.  It is, however, also draining well into the bag. It was reported that CCM had assessed the patient's bleeding upon arrival at @ 0530 and was not concerned at that time. Waiting on CCM (Dr Lamonte Sakai and Jerene Pitch, NP) to round on patient and determine what needs to be done. Katelyn Lamb 8:24 AM

## 2021-07-18 NOTE — Anesthesia Preprocedure Evaluation (Addendum)
Anesthesia Evaluation  Patient identified by MRN, date of birth, ID band Patient awake    Reviewed: Allergy & Precautions, NPO status , Patient's Chart, lab work & pertinent test results  History of Anesthesia Complications Negative for: history of anesthetic complications  Airway Mallampati: II  TM Distance: >3 FB Neck ROM: Full    Dental  (+) Caps, Dental Advisory Given   Pulmonary former smoker,  epistaxis   breath sounds clear to auscultation       Cardiovascular + DVT   Rhythm:Regular Rate:Normal  '21 ECHO: EF 65-70%. The LVhas normal function, no regional wall motion abnormalities. mild LVH, Grade I DD, trivial MR   Neuro/Psych Seizures -,  Anxiety Depression    GI/Hepatic Neg liver ROS, GERD  Controlled,  Endo/Other  negative endocrine ROS  Renal/GU negative Renal ROS     Musculoskeletal   Abdominal   Peds  Hematology xarelto   Anesthesia Other Findings   Reproductive/Obstetrics                            Anesthesia Physical Anesthesia Plan  ASA: 3 and emergent  Anesthesia Plan: General   Post-op Pain Management:    Induction: Intravenous and Rapid sequence  PONV Risk Score and Plan: 3 and Ondansetron, Dexamethasone and Treatment may vary due to age or medical condition  Airway Management Planned: Video Laryngoscope Planned and Oral ETT  Additional Equipment: None  Intra-op Plan:   Post-operative Plan: Extubation in OR  Informed Consent: I have reviewed the patients History and Physical, chart, labs and discussed the procedure including the risks, benefits and alternatives for the proposed anesthesia with the patient or authorized representative who has indicated his/her understanding and acceptance.     Dental advisory given  Plan Discussed with: CRNA and Surgeon  Anesthesia Plan Comments:        Anesthesia Quick Evaluation

## 2021-07-18 NOTE — Transfer of Care (Signed)
Immediate Anesthesia Transfer of Care Note  Patient: Katelyn Lamb  Procedure(s) Performed: IR WITH ANESTHESIA  Patient Location: NICU  Anesthesia Type:General  Level of Consciousness: Patient remains intubated per anesthesia plan  Airway & Oxygen Therapy: Patient placed on Ventilator (see vital sign flow sheet for setting)  Post-op Assessment: Report given to RN and Post -op Vital signs reviewed and stable  Post vital signs: Reviewed and stable  Last Vitals:  Vitals Value Taken Time  BP 151/108 07/18/21 0530  Temp    Pulse 82 07/18/21 0531  Resp 16 07/18/21 0531  SpO2 100 % 07/18/21 0531  Vitals shown include unvalidated device data.  Last Pain:  Vitals:   07/17/21 2314  TempSrc: Oral  PainSc:          Complications: No notable events documented.

## 2021-07-18 NOTE — Progress Notes (Signed)
75 y.o female inpatient. 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 Presented to there ED at [facility] with generalized weakness, and difficulty ambulating.  Patient developed acute bleeding of the left nare on 11.14.22 @ 22:30. Rhinorocket placed for hemostasis was unsuccessful.  8 cm Merocel sponge and Afrin attempted by ENT also unsuccessful. ENT placed a Epistat catheter with 19 ml balloon. Patient was then brought to IR for a cerebral angiogram with endovascular epistaxis embolization using contour particulars 150-250 mc. Case was performed by Dr. Raliegh Ip. Karenann Cai the morning on 11.15.22. Patient remained intubated post procedure.   Patient seen at bedside by Dr. Raliegh Ip. Karenann Cai. Daughter and RN present. Patient continues to have bleeding from the left nare. Packing is in place. Patient has received K centra and Vitamin K.  Right groin access site is soft with no active bleeding and no appreciable pseudoaneurysm. Patient to be seen by ENT for further evaluation and discussion of possible glue embolization.   NIR continue to follow.

## 2021-07-18 NOTE — Progress Notes (Signed)
RT NOTE: RT pulled ETT back 3 cm to 21 at the lips per CCM order per chest xray. Vitals are stable. BBS present and good volumes on ventilator. RT will continue to monitor.

## 2021-07-18 NOTE — Progress Notes (Signed)
  X-cover Note: Pt noted to continue to bleed through rhinorocket.  Dr. Marcelline Deist with ENT was able to locate Epistax catheter which is a posterior pack for epistaxis. Discussed case with PCCM who recommended reversal of xarelto. Pt on xarelto for hx of afib. 2 units of FFP ordered.  Discussed case with Dr. Marcelline Deist who is concerned pt may still need IR for arterial coil. Discussed that if he is requesting IR for selection arterial coiling, that he will need to discuss case with IR on-call. I gave him # of on-call IR Dr. Pascal Lux.  Pt placed on telemetry.  Kristopher Oppenheim, DO [facility]

## 2021-07-18 NOTE — H&P (Addendum)
NAME:  Katelyn Lamb, MRN:  [id number], DOB:  [birth year], LOS: 3 ADMISSION DATE:  07/14/2021, CONSULTATION DATE:  07/18/21 REFERRING MD:  Ethel Rana, CHIEF COMPLAINT:  epistaxis  History of Present Illness:  75 yo woman with encephalomyelitis and transvers myelitis due to HSV 5 years ago, neurogenic bladder, subrapubic catheter, hx of UTIS, admitted here 11/11 with weakness, unable to ambulate.  Admitted for UTI and started on meropenem.   ID thought likely colonization, abtx held. 11/12  Developed bleeding from L nare this evening at 1030.  Rhinorocket initially placed with only partial resolution of bleeding.  ENT placed epstat catheter, still bleeding thought significantly slowed, but Patient intubated for IR procedure for embolization of L sphenopalatine artery .  Will remain intubated for airway protection, as  she sill has significant bleeding.      Pertinent  Medical History  Myelitis due to HSV Neurogenic bladder  DVT hx  Encephalomyelitis   Significant Hospital Events: Including procedures, antibiotic start and stop dates in addition to other pertinent events     Interim History / Subjective:    Objective   Blood pressure 120/78, pulse (!) 104, temperature 97.9 F (36.6 C), temperature source Oral, resp. rate 20, weight 68 kg, SpO2 97 %.        Intake/Output Summary (Last 24 hours) at 07/18/2021 0505 Last data filed at 07/18/2021 0438 Gross per 24 hour  Intake 2797 ml  Output 2800 ml  Net -3 ml   Filed Weights   07/14/21 2100  Weight: 68 kg    Examination: General: NAD intubated  HENT: packed L nare  Lungs: CTAB  Cardiovascular: RRR  Abdomen: nt, nd, nbs  Extremities: no edema no erythema Neuro: sedated, intubated    Resolved Hospital Problem list     Assessment & Plan:  Epistaxis: S/p IR embolization.  Admitted to ICU post procedure as the patient is still having some epistaxis, will remain Intubated for airway protection.  Patient takes xarelto for  DVT, will reverse with [place].     Weakness: possible catheter associated UTI vs colonization.  Possible progression of underlying disease, vs weakness due to infection. Off abtx for now.   Transverse myelitis.   Chronic pain syndrome: Continue Lyrica, Keppra, prednisone   History of DVT Held anticoag, reverse for the short term until bleeding controlled.   Best Practice (right click and "Reselect all SmartList Selections" daily)   Diet/type: tubefeeds DVT prophylaxis: other GI prophylaxis: N/A Lines: N/A Foley:  N/A Code Status:  full code Last date of multidisciplinary goals of care discussion []   Labs   CBC: Recent Labs  Lab 07/14/21 1900 07/15/21 0318 07/18/21 0305  WBC 9.9 9.3 12.7*  NEUTROABS 6.5  --  6.7  HGB 13.0 12.2 12.9  HCT 41.7 39.8 41.0  MCV 98.3 98.3 97.4  PLT 230 237 601    Basic Metabolic Panel: Recent Labs  Lab 07/14/21 1900 07/15/21 0318 07/18/21 0305  NA 138 141 137  K 4.3 4.0 3.7  CL 102 103 105  CO2 26 27 23   GLUCOSE 121* 102* 150*  BUN 22 18 19   CREATININE 0.86 0.75 0.81  CALCIUM 9.0 9.0 8.6*   GFR: Estimated Creatinine Clearance: 60.2 mL/min (by C-G formula based on SCr of 0.81 mg/dL). Recent Labs  Lab 07/14/21 1900 07/15/21 0318 07/18/21 0305  WBC 9.9 9.3 12.7*    Liver Function Tests: Recent Labs  Lab 07/14/21 1900  AST 40  ALT 33  ALKPHOS 100  BILITOT 0.4  PROT 6.2*  ALBUMIN 3.2*   Recent Labs  Lab 07/14/21 1900  LIPASE 38   No results for input(s): AMMONIA in the last 168 hours.  ABG    Component Value Date/Time   PHART 7.343 (L) 02/12/2020 1220   PCO2ART 46.1 02/12/2020 1220   PO2ART 87.8 02/12/2020 1220   HCO3 24.4 02/12/2020 1220   TCO2 26 01/20/2020 1104   ACIDBASEDEF 0.9 02/12/2020 1220   O2SAT 96.2 02/12/2020 1220     Coagulation Profile: No results for input(s): INR, PROTIME in the last 168 hours.  Cardiac Enzymes: No results for input(s): CKTOTAL, CKMB, CKMBINDEX, TROPONINI in the last  168 hours.  HbA1C: Hgb A1c MFr Bld  Date/Time Value Ref Range Status  03/15/2021 04:43 AM 5.9 (H) 4.8 - 5.6 % Final    Comment:    (NOTE) Pre diabetes:          5.7%-6.4%  Diabetes:              >6.4%  Glycemic control for   <7.0% adults with diabetes     CBG: Recent Labs  Lab 07/14/21 1904  GLUCAP 125*    Review of Systems:   Unable, intubated, sedated  Past Medical History:  She,  has a past medical history of Acute deep vein thrombosis (DVT) of popliteal vein of left lower extremity ([place]), Anxiety, Back pain, Chronic kidney disease, Encephalomyelitis, Gait abnormality (11/14/2016), Memory difficulty (03/02/2019), Myelitis due to herpes simplex ([place]), and Neurogenic bladder.   Surgical History:   Past Surgical History:  Procedure Laterality Date   ABDOMINAL HYSTERECTOMY     BLADDER REPAIR     CESAREAN SECTION     IR KYPHO LUMBAR INC FX REDUCE BONE BX UNI/BIL CANNULATION INC/IMAGING  04/11/2018   TUBAL LIGATION       Social History:   reports that she has quit smoking. She has never used smokeless tobacco. She reports that she does not drink alcohol and does not use drugs.   Family History:  Her family history includes Hypertension in her mother.   Allergies Allergies  Allergen Reactions   Demerol [Meperidine] Other (See Comments)    Hallucinations   Percocet [Oxycodone-Acetaminophen] Itching   Amoxicillin-Pot Clavulanate Diarrhea    Severe pain, headache, intestinal infection   Penicillins Itching and Rash    Has patient had a PCN reaction causing immediate rash, facial/tongue/throat swelling, SOB or lightheadedness with hypotension:  NO Has patient had a PCN reaction causing severe rash involving mucus membranes or skin necrosis: No Has patient had a PCN reaction that required hospitalization: No Has patient had a PCN reaction occurring within the last 10 years: Yes If all of the above answers are "NO", then may proceed with Cephalosporin use.     Home  Medications  Prior to Admission medications   Medication Sig Start Date End Date Taking? Authorizing Provider  acyclovir (ZOVIRAX) 400 MG tablet TAKE 1 TABLET BY MOUTH TWICE A DAY Patient taking differently: Take 400 mg by mouth 2 (two) times daily. 03/27/21  Yes [name], MD  Ascorbic Acid (VITAMIN C) 500 MG CHEW Chew 500 mg by mouth daily.   Yes [provider]  Calcium Carb-Cholecalciferol (CALCIUM 600 + D PO) Take 1 tablet by mouth in the morning and at bedtime.   Yes [provider]  Cyanocobalamin (VITAMIN B-12 PO) Take 1 tablet by mouth daily.   Yes [provider]  diclofenac Sodium (VOLTAREN) 1 % GEL Apply 1 application topically 2 (two)  times daily as needed (nerve pain).   Yes [provider]  docusate sodium (COLACE) 100 MG capsule Take 200 mg by mouth daily as needed (constipation).   Yes [provider]  DULoxetine (CYMBALTA) 30 MG capsule Take 30 mg by mouth in the morning. 03/17/21  Yes [provider]  DULoxetine (CYMBALTA) 60 MG capsule Take 1 capsule (60 mg total) by mouth 2 (two) times daily. Patient taking differently: Take 60 mg by mouth at bedtime. 06/27/21  Yes [name], MD  Emollient (CERAVE) CREA Apply 1 application topically daily as needed (bruise spots).   Yes [provider]  ferrous sulfate 325 (65 FE) MG tablet Take 325 mg by mouth every morning. 03/10/21  Yes [provider]  HYDROcodone-acetaminophen (NORCO) 10-325 MG tablet Take 1 tablet by mouth every 6 (six) hours as needed for pain. 03/07/18  Yes [provider]  levETIRAcetam (KEPPRA) 250 MG tablet TAKE 1 TABLET BY MOUTH TWICE A DAY Patient taking differently: Take 250 mg by mouth 2 (two) times daily. 05/09/21  Yes [name], MD  mirabegron ER (MYRBETRIQ) 50 MG TB24 tablet Take 50 mg by mouth at bedtime.   Yes [provider]  Multiple Vitamins-Minerals (HAIR/SKIN/NAILS/BIOTIN) TABS Take 1 tablet by  mouth every morning.   Yes [provider]  naproxen sodium (ALEVE) 220 MG tablet Take 220 mg by mouth 2 (two) times daily as needed (headache).   Yes [provider]  omega-3 acid ethyl esters (LOVAZA) 1 g capsule Take 1 g by mouth every morning.   Yes [provider]  pantoprazole (PROTONIX) 40 MG tablet Take 1 tablet (40 mg total) by mouth daily. Patient taking differently: Take 40 mg by mouth at bedtime. 10/26/16  Yes Angiulli, Lavon Paganini, PA-C  polyethylene glycol (MIRALAX / GLYCOLAX) packet Take 17 g by mouth daily. Patient taking differently: Take 17 g by mouth daily as needed (constipation). 10/26/16  Yes Angiulli, Lavon Paganini, PA-C  predniSONE (DELTASONE) 5 MG tablet TAKE 1 TABLET BY MOUTH EVERY DAY WITH BREAKFAST Patient taking differently: Take 5 mg by mouth daily with breakfast. 03/27/21  Yes [name], MD  pregabalin (LYRICA) 50 MG capsule Take 2 capsules (100 mg total) by mouth at bedtime. 02/09/21  Yes [name], MD  QUEtiapine (SEROQUEL) 100 MG tablet Take 100 mg by mouth at bedtime. 01/25/21  Yes [provider]  rivaroxaban (XARELTO) 20 MG TABS tablet Take 1 tablet (20 mg total) by mouth daily with supper. Patient taking differently: Take 20 mg by mouth See admin instructions. Take one tablet (20 mg) by mouth daily with breakfast or lunch 03/06/20  Yes Cherene Altes, MD  diazepam (VALIUM) 5 MG tablet TAKE 1 TABLET (5 MG TOTAL) BY MOUTH IN THE MORNING AND AT BEDTIME. Patient taking differently: Take 5 mg by mouth in the morning and at bedtime. For tremors 02/14/21   [name], MD     Critical care time: 45 min

## 2021-07-18 NOTE — Progress Notes (Signed)
Subjective: Patient remains intubated status postembolization of left IMAX.  The anterior and posterior balloons on her epistatic catheter were untouched by the radiologist and continue to be inflated.  She had some bleeding this morning although it is now stopped.  Her hemoglobins are stable and she is receiving Kcentra.  The son and daughter were in the room this morning during my visit.  Objective: Vital signs in last 24 hours: Temp:  [96.2 F (35.7 C)-98.6 F (37 C)] 97.3 F (36.3 C) (11/15 0800) Pulse Rate:  [78-112] 108 (11/15 1129) Resp:  [17-21] 20 (11/15 1129) BP: (105-158)/(72-108) 140/98 (11/15 1000) SpO2:  [92 %-100 %] 100 % (11/15 1129) FiO2 (%):  [40 %-100 %] 40 % (11/15 1129) Weight:  [78 kg] 78 kg (11/15 0924) Wt Readings from Last 1 Encounters:  07/18/21 78 kg    Intake/Output from previous day: 11/14 0701 - 11/15 0700 In: 2797 [P.O.:897; I.V.:1900] Out: 2500 [Urine:2500] Intake/Output this shift: Total I/O In: 307.5 [I.V.:137.5; IV Piggyback:170] Out: 4166 [Urine:1420]  Patient is intubated with epistatic catheter in place on the left side.  She has 15 cc in the anterior balloon and 10 cc in the posterior balloon.  No active bleeding at this time.  Recent Labs    07/18/21 0305 07/18/21 0607 07/18/21 0959  WBC 12.7*  --  17.5*  HGB 12.9 10.2* 11.5*  HCT 41.0 30.0* 35.8*  PLT 259  --  267    Recent Labs    07/18/21 0246 07/18/21 0305 07/18/21 0607  NA 138 137 137  K 7.4* 3.7 3.9  CL 104 105  --   CO2  --  23  --   GLUCOSE 115* 150*  --   BUN 34* 19  --   CREATININE 0.80 0.81  --   CALCIUM  --  8.6*  --     Medications: I have reviewed the patient's current medications.  Assessment/Plan: Epistaxis  I think we are headed in the right direction.  She has no active bleeding now either anteriorly or posteriorly.  I had a long talk with her children about last night's challenges related to her platelet inhibitors as well as our options going  forward.  We discussed reembolization as well as endoscopic surgical options.  Given that she is not bleeding at this time, I would recommend we hold off on any sort of invasive procedure and leave the epistatic catheter inflated.  We will follow her blood counts and make a decision tomorrow morning about when to deflate the catheter if she is not having additional bleeding.  If bleeding resumes, my recommendation would be that we go to the operating room for endoscopic control.  I discussed the case with the attending critical care physician and am immediately available to assist with her care today as needed.   LOS: 3 days   Marcina Millard 07/18/2021, 11:56 AM

## 2021-07-18 NOTE — Consult Note (Signed)
Reason for Consult:epistaxis Referring Physician: Quita Mcgrory [name] is an 75 y.o. female.  HPI: Patient on Katelyn Lamb for previous clotting issues 2 years ago admitted for UTI.  4 hours ago she received a does of Xarelto and began to have an ongoing left sided nosebleed.  I was called while performing an emergency airway in the OR but arrived at the bedside 25 minutes after the call.  She had a RhinoRocket in the left naris. Her husband and her nurse were in the room with me.  Past Medical History:  Diagnosis Date   Acute deep vein thrombosis (DVT) of popliteal vein of left lower extremity ([facility])    Anxiety    Back pain    Chronic kidney disease    Encephalomyelitis    Gait abnormality 11/14/2016   Memory difficulty 03/02/2019   Myelitis due to herpes simplex [facility])    Neurogenic bladder     Past Surgical History:  Procedure Laterality Date   ABDOMINAL HYSTERECTOMY     BLADDER REPAIR     CESAREAN SECTION     IR KYPHO LUMBAR INC FX REDUCE BONE BX UNI/BIL CANNULATION INC/IMAGING  04/11/2018   TUBAL LIGATION      Family History  Problem Relation Age of Onset   Hypertension Mother     Social History:  reports that she has quit smoking. She has never used smokeless tobacco. She reports that she does not drink alcohol and does not use drugs.  Allergies:  Allergies  Allergen Reactions   Demerol [Meperidine] Other (See Comments)    Hallucinations   Percocet [Oxycodone-Acetaminophen] Itching   Amoxicillin-Pot Clavulanate Diarrhea    Severe pain, headache, intestinal infection   Penicillins Itching and Rash    Has patient had a PCN reaction causing immediate rash, facial/tongue/throat swelling, SOB or lightheadedness with hypotension:  NO Has patient had a PCN reaction causing severe rash involving mucus membranes or skin necrosis: No Has patient had a PCN reaction that required hospitalization: No Has patient had a PCN reaction occurring within the last 10 years: Yes If all of the  above answers are "NO", then may proceed with Cephalosporin use.    Medications: I have reviewed the patient's current medications.  No results found for this or any previous visit (from the past 48 hour(s)).  No results found.  Review of Systems Blood pressure 120/78, pulse (!) 104, temperature 97.9 F (36.6 C), temperature source Oral, resp. rate 20, weight 68 kg, SpO2 97 %. Physical Exam  Ongoing bleeding from, the left naris PERRL/EOMI Chest sym expansions bilaterally Neck without mass or lesion Face without lesion or mass   Assessment/Plan:  Epistaxis  I first attempted to stop the bleeding with an 8 cm Merocel sponge and Afrin.  She bled right through the pack.  There was no Epistat catheter in the ENT cart.  I called the OR and ER and they could not find one.  We called [facility] Lamb and they were sending one over.  While waiting, I went to the ER and looked myself but did not find one on any of the 4 ENT carts.  I went to the OR and the staff and I finally found an Epistat catheter.  I went back to the bedside.  I placed the Epistat catheter after removal of the Merocel.  Placed 10 cc in the posterior balloon and progressively placed amounts in the anterior balloon until we reached 19 cc.  She could not tolerate any more saline.  Bleeding persisted.  I called IR on call and requested embolization.  I have notified the attending internist of the plan, and he placed her on a monitor. IR is taking now for embolization - I spoke directly with the attending on call.  Katelyn Lamb 07/18/2021, 12:42 AM

## 2021-07-18 NOTE — Care Plan (Addendum)
Overnight events noted. Patient developed nosebleed which was uncontrolled.  S/p embolization of Left IMAX.  Patient was intubated for airway protection.  Patient remains under ICU team.  We will sign off. [facility].

## 2021-07-18 NOTE — Progress Notes (Signed)
[place] Progress Note Patient Name: Katelyn Lamb DOB: [birth year] MRN: [id number]   Date of Service  07/18/2021  HPI/Events of Note  Patient intubated for airway protection and sent to IR for embolization of bleeding spheno-palatine vessel, patient brought back to ICU intubated secondary to sub-optimal hemostasis after the procedure.  eICU Interventions  New Patient Evaluation.        Kerry Kass Esther Broyles 07/18/2021, 6:21 AM

## 2021-07-18 NOTE — Progress Notes (Signed)
ETT holder changed.

## 2021-07-18 NOTE — Progress Notes (Signed)
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  Admitted for UTI and started on meropenem.   ID thought likely colonization, abtx held. 11/12  Developed bleeding from L nare this evening at 1030.  Rhinorocket initially placed with only partial resolution of bleeding.  ENT placed epstat catheter, still bleeding thought significantly slowed, but Patient intubated for IR procedure for embolization of L sphenopalatine artery .  Will remain intubated for airway protection, as  she sill has significant bleeding.      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 Hgb 12.9 >> 10.2 I/O- 2.5 L total Not currently on abx, received 2 days meropenem Suprapubic catheter with some leakage since it was replaced  Objective   Blood pressure 126/83, pulse 87, temperature (!) 97.3 F (36.3 C), temperature source Axillary, resp. rate 20, height 5\' 7"  (1.702 m), weight 68 kg, SpO2 100 %.    Vent Mode: PRVC FiO2 (%):  [100 %] 100 % Set Rate:  [20 bmp] 20 bmp Vt Set:  [490 mL] 490 mL PEEP:  [5 cmH20] 5 cmH20 Plateau Pressure:  [16 cmH20] 16 cmH20   Intake/Output Summary (Last 24 hours) at 07/18/2021 3893 Last data filed at 07/18/2021 0746 Gross per 24 hour  Intake 2797 ml  Output 3125  ml  Net -328 ml   Filed Weights   07/14/21 2100  Weight: 68 kg    Examination: General: NAD intubated  HENT: packed L nare  Lungs: CTAB  Cardiovascular: RRR  Abdomen: nt, nd, nbs  Extremities: no edema no erythema Neuro: sedated, intubated    Resolved Hospital Problem list     Assessment & Plan:  Epistaxis left nare: S/p IR embolization of left sphenopalatine artery, but with persistent bleeding -Remains intubated for airway protection -She has received vitamin K and Kcentra, Xarelto held -We will need to consult with IR and ENT to determine whether there are any other interventions to make given her persistent epistaxis. -Follow CBC  Acute respiratory failure due to epistaxis, airway protection -PRVC 8 cc/kg -Wean FiO2 -Pulmonary hygiene -Sedation as per PAD protocol, propofol -No plans to push for extubation until epistaxis under control  Transverse myelitis Acute weakness: Question possible catheter associated UTI vs colonization contributing to decreased function.  Consider also possible progression of underlying disease -Continue her chronic prednisone  Chronic pain syndrome: -Continue Lyrica, Keppra   History of DVT -Xarelto on hold  Best Practice (right click and "Reselect all SmartList Selections" daily)   Diet/type: tubefeeds DVT prophylaxis: other GI prophylaxis: N/A Lines: N/A Foley:  N/A Code Status:  full code Last date of multidisciplinary goals of care discussion [pending] Family: Husband updated at bedside 11/15  Labs   CBC: Recent Labs  Lab 07/14/21 1900 07/15/21 0318 07/18/21 0246 07/18/21 0305 07/18/21 0607  WBC 9.9 9.3  --  12.7*  --  NEUTROABS 6.5  --   --  6.7  --   HGB 13.0 12.2 14.6 12.9 10.2*  HCT 41.7 39.8 43.0 41.0 30.0*  MCV 98.3 98.3  --  97.4  --   PLT 230 237  --  259  --     Basic Metabolic Panel: Recent Labs  Lab 07/14/21 1900 07/15/21 0318 07/18/21 0246 07/18/21 0305 07/18/21 0607  NA 138 141 138 137  137  K 4.3 4.0 7.4* 3.7 3.9  CL 102 103 104 105  --   CO2 26 27  --  23  --   GLUCOSE 121* 102* 115* 150*  --   BUN 22 18 34* 19  --   CREATININE 0.86 0.75 0.80 0.81  --   CALCIUM 9.0 9.0  --  8.6*  --    GFR: Estimated Creatinine Clearance: 60.2 mL/min (by C-G formula based on SCr of 0.81 mg/dL). Recent Labs  Lab 07/14/21 1900 07/15/21 0318 07/18/21 0305  WBC 9.9 9.3 12.7*    Liver Function Tests: Recent Labs  Lab 07/14/21 1900  AST 40  ALT 33  ALKPHOS 100  BILITOT 0.4  PROT 6.2*  ALBUMIN 3.2*   Recent Labs  Lab 07/14/21 1900  LIPASE 38   No results for input(s): AMMONIA in the last 168 hours.  ABG    Component Value Date/Time   PHART 7.424 07/18/2021 0607   PCO2ART 41.3 07/18/2021 0607   PO2ART 450 (H) 07/18/2021 0607   HCO3 27.4 07/18/2021 0607   TCO2 29 07/18/2021 0607   ACIDBASEDEF 0.9 02/12/2020 1220   O2SAT 100.0 07/18/2021 0607     Coagulation Profile: Recent Labs  Lab 07/18/21 0612  INR 1.3*    Cardiac Enzymes: No results for input(s): CKTOTAL, CKMB, CKMBINDEX, TROPONINI in the last 168 hours.  HbA1C: Hgb A1c MFr Bld  Date/Time Value Ref Range Status  03/15/2021 04:43 AM 5.9 (H) 4.8 - 5.6 % Final    Comment:    (NOTE) Pre diabetes:          5.7%-6.4%  Diabetes:              >6.4%  Glycemic control for   <7.0% adults with diabetes     CBG: Recent Labs  Lab 07/14/21 1904  GLUCAP 125*     Critical care time: 63 min     Baltazar Apo, MD, PhD 07/18/2021, 8:39 AM [facility] [phone] or if no answer before 7:00PM call [phone] For any issues after 7:00PM please call eLink [phone]

## 2021-07-18 NOTE — TOC Progression Note (Signed)
Transition of Care [facility]) - Progression Note    Patient Details  Name: Katelyn Lamb MRN: [id number] Date of Birth: 1946/01/06  [facility] CM/SW Contact  Ella Bodo, RN Phone Number: 07/18/2021, 3:33 PM  Clinical Narrative:    Noted recent events; patient remains intubated s/p postembolization of Lt IMAX. TOC will continue to follow as patient progresses.    Expected Discharge Plan: IP Rehab Facility Barriers to Discharge: Continued Medical Work up  Expected Discharge Plan and Services Expected Discharge Plan: [place]   Discharge Planning Services: CM Consult Post Acute Care Choice: IP Rehab Living arrangements for the past 2 months: Single Family Home                                       Social Determinants of Health (SDOH) Interventions    Readmission Risk Interventions No flowsheet data found.  Reinaldo Raddle, RN, BSN  Trauma/Neuro ICU Case Manager [phone]

## 2021-07-18 NOTE — Progress Notes (Signed)
Bleeding has stopped from nose.  Urine output is within normal limits no longer without color.  Urostomy still leaks a bit but manageable. Will continue to monitor. [place], [place]

## 2021-07-18 NOTE — Anesthesia Procedure Notes (Signed)
Procedure Name: Intubation Date/Time: 07/18/2021 2:27 AM Performed by: Valetta Fuller, CRNA Pre-anesthesia Checklist: Patient identified, Emergency Drugs available, Suction available and Patient being monitored Patient Re-evaluated:Patient Re-evaluated prior to induction Oxygen Delivery Method: Circle system utilized Preoxygenation: Pre-oxygenation with 100% oxygen Induction Type: IV induction and Rapid sequence Laryngoscope Size: Glidescope Grade View: Grade I Tube type: Oral Tube size: 7.5 mm Number of attempts: 2 Airway Equipment and Method: Stylet Placement Confirmation: ETT inserted through vocal cords under direct vision, positive ETCO2 and breath sounds checked- equal and bilateral Secured at: 24 cm Tube secured with: Tape Comments: Oropharynx full of blood. DL with Sabra Heck 2, unable to visualize structures despite suctioning. Video laryngoscopy for intubation without difficulty. ETT suctioned of blood.

## 2021-07-18 NOTE — Progress Notes (Signed)
Pt transported from IR to 4N 24 without event.

## 2021-07-18 NOTE — Progress Notes (Signed)
Inpatient Rehabilitation Admissions Coordinator   Noted events overnight. I will follow her progress. I withdrew Health Team advantage Auth.  Danne Baxter, RN, MSN Rehab Admissions Coordinator [phone] 07/18/2021 8:58 AM

## 2021-07-19 ENCOUNTER — Encounter (HOSPITAL_COMMUNITY): Payer: Self-pay | Admitting: Neuroradiology

## 2021-07-19 ENCOUNTER — Inpatient Hospital Stay (HOSPITAL_COMMUNITY): Payer: PPO

## 2021-07-19 DIAGNOSIS — Z7902 Long term (current) use of antithrombotics/antiplatelets: Secondary | ICD-10-CM | POA: Diagnosis not present

## 2021-07-19 DIAGNOSIS — J9601 Acute respiratory failure with hypoxia: Secondary | ICD-10-CM

## 2021-07-19 DIAGNOSIS — R040 Epistaxis: Secondary | ICD-10-CM | POA: Diagnosis not present

## 2021-07-19 LAB — CBC
HCT: 31.8 % — ABNORMAL LOW (ref 36.0–46.0)
Hemoglobin: 9.9 g/dL — ABNORMAL LOW (ref 12.0–15.0)
MCH: 30.3 pg (ref 26.0–34.0)
MCHC: 31.1 g/dL (ref 30.0–36.0)
MCV: 97.2 fL (ref 80.0–100.0)
Platelets: 250 10*3/uL (ref 150–400)
RBC: 3.27 MIL/uL — ABNORMAL LOW (ref 3.87–5.11)
RDW: 16.1 % — ABNORMAL HIGH (ref 11.5–15.5)
WBC: 22 10*3/uL — ABNORMAL HIGH (ref 4.0–10.5)
nRBC: 0.1 % (ref 0.0–0.2)

## 2021-07-19 LAB — BASIC METABOLIC PANEL
Anion gap: 11 (ref 5–15)
BUN: 14 mg/dL (ref 8–23)
CO2: 22 mmol/L (ref 22–32)
Calcium: 8.4 mg/dL — ABNORMAL LOW (ref 8.9–10.3)
Chloride: 106 mmol/L (ref 98–111)
Creatinine, Ser: 0.79 mg/dL (ref 0.44–1.00)
GFR, Estimated: >60 mL/min (ref >60)
Glucose, Bld: 130 mg/dL — ABNORMAL HIGH (ref 70–99)
Potassium: 4.3 mmol/L (ref 3.5–5.1)
Sodium: 139 mmol/L (ref 135–145)

## 2021-07-19 LAB — MAGNESIUM: Magnesium: 1.8 mg/dL (ref 1.7–2.4)

## 2021-07-19 LAB — TRIGLYCERIDES: Triglycerides: 162 mg/dL — ABNORMAL HIGH (ref <150)

## 2021-07-19 MED ORDER — DULOXETINE HCL 30 MG PO CPEP
30.0000 mg | ORAL_CAPSULE | Freq: Every morning | ORAL | Status: DC
  Administered 2021-07-20 – 2021-08-02 (×13): 30 mg via ORAL
  Filled 2021-07-19 (×13): qty 1

## 2021-07-19 MED ORDER — DULOXETINE HCL 60 MG PO CPEP
60.0000 mg | ORAL_CAPSULE | Freq: Every day | ORAL | Status: DC
  Administered 2021-07-20 – 2021-08-02 (×14): 60 mg via ORAL
  Filled 2021-07-19 (×16): qty 1

## 2021-07-19 MED ORDER — MAGNESIUM SULFATE 2 GM/50ML IV SOLN
2.0000 g | Freq: Once | INTRAVENOUS | Status: AC
  Administered 2021-07-19: 2 g via INTRAVENOUS
  Filled 2021-07-19: qty 50

## 2021-07-19 MED ORDER — LEVETIRACETAM 250 MG PO TABS
250.0000 mg | ORAL_TABLET | Freq: Two times a day (BID) | ORAL | Status: DC
  Administered 2021-07-19 – 2021-08-02 (×28): 250 mg via ORAL
  Filled 2021-07-19 (×31): qty 1

## 2021-07-19 MED ORDER — DIAZEPAM 5 MG PO TABS
5.0000 mg | ORAL_TABLET | Freq: Two times a day (BID) | ORAL | Status: DC
  Administered 2021-07-19 – 2021-08-02 (×29): 5 mg via ORAL
  Filled 2021-07-19 (×29): qty 1

## 2021-07-19 MED ORDER — QUETIAPINE FUMARATE 100 MG PO TABS
100.0000 mg | ORAL_TABLET | Freq: Every day | ORAL | Status: DC
  Administered 2021-07-19 – 2021-08-02 (×15): 100 mg via ORAL
  Filled 2021-07-19 (×15): qty 1

## 2021-07-19 MED ORDER — MIRABEGRON ER 50 MG PO TB24
50.0000 mg | ORAL_TABLET | Freq: Every day | ORAL | Status: DC
  Administered 2021-07-20 – 2021-08-02 (×14): 50 mg via ORAL
  Filled 2021-07-19 (×15): qty 1

## 2021-07-19 MED ORDER — PREDNISONE 5 MG PO TABS
5.0000 mg | ORAL_TABLET | Freq: Every day | ORAL | Status: DC
  Administered 2021-07-20 – 2021-07-26 (×7): 5 mg via ORAL
  Filled 2021-07-19 (×7): qty 1

## 2021-07-19 MED ORDER — PHENOL 1.4 % MT LIQD
1.0000 | OROMUCOSAL | Status: DC | PRN
  Administered 2021-07-20 – 2021-07-24 (×3): 1 via OROMUCOSAL
  Filled 2021-07-19: qty 177

## 2021-07-19 MED ORDER — ACYCLOVIR 200 MG/5ML PO SUSP
400.0000 mg | Freq: Two times a day (BID) | ORAL | Status: DC
  Administered 2021-07-19 – 2021-07-20 (×3): 400 mg via ORAL
  Filled 2021-07-19 (×5): qty 10

## 2021-07-19 MED ORDER — PREGABALIN 100 MG PO CAPS
100.0000 mg | ORAL_CAPSULE | Freq: Every day | ORAL | Status: DC
  Administered 2021-07-19 – 2021-08-02 (×15): 100 mg via ORAL
  Filled 2021-07-19: qty 1
  Filled 2021-07-19: qty 2
  Filled 2021-07-19 (×2): qty 1
  Filled 2021-07-19: qty 2
  Filled 2021-07-19 (×10): qty 1

## 2021-07-19 NOTE — Progress Notes (Signed)
Referring Physician(s): * No referring provider recorded for this case *  Supervising Physician: Pedro Earls  Patient Status:  [facility] - In-pt  Chief Complaint:  Pt underwent cerebral angiogram with endovascular epistaxis embolization 07/18/2021 with Dr. Harrold Donath.   Subjective:  Pt denies pain, new instances of bleeding or N/V.   Allergies: Demerol [meperidine], Percocet [oxycodone-acetaminophen], Amoxicillin-pot clavulanate, and Penicillins  Medications: Prior to Admission medications   Medication Sig Start Date End Date Taking? Authorizing Provider  acyclovir (ZOVIRAX) 400 MG tablet TAKE 1 TABLET BY MOUTH TWICE A DAY Patient taking differently: Take 400 mg by mouth 2 (two) times daily. 03/27/21  Yes [name], MD  Ascorbic Acid (VITAMIN C) 500 MG CHEW Chew 500 mg by mouth daily.   Yes [provider]  Calcium Carb-Cholecalciferol (CALCIUM 600 + D PO) Take 1 tablet by mouth in the morning and at bedtime.   Yes [provider]  Cyanocobalamin (VITAMIN B-12 PO) Take 1 tablet by mouth daily.   Yes [provider]  diclofenac Sodium (VOLTAREN) 1 % GEL Apply 1 application topically 2 (two) times daily as needed (nerve pain).   Yes [provider]  docusate sodium (COLACE) 100 MG capsule Take 200 mg by mouth daily as needed (constipation).   Yes [provider]  DULoxetine (CYMBALTA) 30 MG capsule Take 30 mg by mouth in the morning. 03/17/21  Yes [provider]  DULoxetine (CYMBALTA) 60 MG capsule Take 1 capsule (60 mg total) by mouth 2 (two) times daily. Patient taking differently: Take 60 mg by mouth at bedtime. 06/27/21  Yes [name], MD  Emollient (CERAVE) CREA Apply 1 application topically daily as needed (bruise spots).   Yes [provider]  ferrous sulfate 325 (65 FE) MG tablet Take 325 mg by mouth every morning. 03/10/21  Yes [provider]   HYDROcodone-acetaminophen (NORCO) 10-325 MG tablet Take 1 tablet by mouth every 6 (six) hours as needed for pain. 03/07/18  Yes [provider]  levETIRAcetam (KEPPRA) 250 MG tablet TAKE 1 TABLET BY MOUTH TWICE A DAY Patient taking differently: Take 250 mg by mouth 2 (two) times daily. 05/09/21  Yes [name], MD  mirabegron ER (MYRBETRIQ) 50 MG TB24 tablet Take 50 mg by mouth at bedtime.   Yes [provider]  Multiple Vitamins-Minerals (HAIR/SKIN/NAILS/BIOTIN) TABS Take 1 tablet by mouth every morning.   Yes [provider]  naproxen sodium (ALEVE) 220 MG tablet Take 220 mg by mouth 2 (two) times daily as needed (headache).   Yes [provider]  omega-3 acid ethyl esters (LOVAZA) 1 g capsule Take 1 g by mouth every morning.   Yes [provider]  pantoprazole (PROTONIX) 40 MG tablet Take 1 tablet (40 mg total) by mouth daily. Patient taking differently: Take 40 mg by mouth at bedtime. 10/26/16  Yes Angiulli, Lavon Paganini, PA-C  polyethylene glycol (MIRALAX / GLYCOLAX) packet Take 17 g by mouth daily. Patient taking differently: Take 17 g by mouth daily as needed (constipation). 10/26/16  Yes Angiulli, Lavon Paganini, PA-C  predniSONE (DELTASONE) 5 MG tablet TAKE 1 TABLET BY MOUTH EVERY DAY WITH BREAKFAST Patient taking differently: Take 5 mg by mouth daily with breakfast. 03/27/21  Yes [name], MD  pregabalin (LYRICA) 50 MG capsule Take 2 capsules (100 mg total) by mouth at bedtime. 02/09/21  Yes [name], MD  QUEtiapine (SEROQUEL) 100 MG tablet Take 100 mg by mouth  at bedtime. 01/25/21  Yes [provider]  rivaroxaban (XARELTO) 20 MG TABS tablet Take 1 tablet (20 mg total) by mouth daily with supper. Patient taking differently: Take 20 mg by mouth See admin instructions. Take one tablet (20 mg) by mouth daily with breakfast or lunch 03/06/20  Yes Cherene Altes, MD  diazepam (VALIUM) 5 MG tablet TAKE 1 TABLET (5 MG TOTAL) BY MOUTH  IN THE MORNING AND AT BEDTIME. Patient taking differently: Take 5 mg by mouth in the morning and at bedtime. For tremors 02/14/21   [name], MD     Vital Signs: BP 133/77   Pulse (!) 112   Temp 97.7 F (36.5 C) (Axillary)   Resp 17   Ht '5\' 7"'  (1.702 m)   Wt 167 lb 15.9 oz (76.2 kg)   SpO2 99%   BMI 26.31 kg/m   Physical Exam Constitutional:      Appearance: She is ill-appearing.  HENT:     Head: Normocephalic and atraumatic.     Nose:     Comments: Balloon catheter port in left nare, Dried blood around both nares Cardiovascular:     Rate and Rhythm: Regular rhythm. Tachycardia present.  Pulmonary:     Effort: Pulmonary effort is normal. No respiratory distress.     Comments: Pt on O2 face mask Skin:    General: Skin is warm and dry.  Neurological:     Mental Status: She is alert. She is disoriented.  Psychiatric:        Mood and Affect: Mood normal.        Behavior: Behavior normal.    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 Presented to there ED at [facility] with generalized weakness, and difficulty ambulating. Patient developed acute bleeding of the left nare on 11.14.22 at 22:30. Rhinorocket placed for hemostasis was unsuccessful. 8 cm Merocel sponge and Afrin attempted by ENT also unsuccessful. ENT placed a Epistat catheter with 19 ml balloon. She comes to our service for a cerebral angiogram with endovascular epistaxis embolization. EXAM: ULTRASOUND-GUIDED VASCULAR ACCESS DIAGNOSTIC CEREBRAL ANGIOGRAM VASCULAR EMBOLIZATION OF BILATERAL SPHENOPALATINE ARTERIES COMPARISON:  None. MEDICATIONS: Ancef 2 gm IV. The antibiotic was administered within 1 hour of the procedure. ANESTHESIA/SEDATION: The procedure was performed under general  anesthesia. CONTRAST:  170 mL of Omnipaque 300 milligram/mL FLUOROSCOPY TIME:  Fluoroscopy Time: 45 minutes 42 seconds (1979 mGy). COMPLICATIONS: None immediate. TECHNIQUE: Informed written consent was obtained from the patient and her husband after a thorough discussion of the procedural risks, benefits and alternatives. All questions were addressed. Maximal Sterile Barrier Technique was utilized including caps, mask, sterile gowns, sterile gloves, sterile drape, hand hygiene and skin antiseptic. A timeout was performed prior to the initiation of the procedure. The right groin was prepped and draped in the usual sterile fashion. Using a micropuncture kit and the modified Seldinger technique, access was gained to the right common femoral artery and an 8 French sheath was placed. Real-time ultrasound guidance was utilized for vascular access including the acquisition of a permanent ultrasound image documenting patency of the accessed vessel. Under fluoroscopy, a 4 [place] Berenstein 2 catheter was navigated over a 0.035" Terumo Glidewire into the aortic arch. The catheter was placed into the left common carotid artery. Frontal and lateral angiograms of the neck were obtained. The catheter  was then placed into the left internal carotid artery. Frontal and lateral angiograms of the head were obtained. Next, the catheter was placed into the left external carotid artery. Frontal and lateral angiograms of the neck and head/face were obtained. Left sphenopalatine artery embolization was then carried out as described below. Next, the Envoy catheter was retracted into the aortic arch and then navigated to the right common carotid artery. Frontal and lateral angiograms of the neck were obtained. The catheter was then placed into the right internal carotid artery. Frontal and lateral angiograms of the head were obtained. Attempts to navigate the Envoy catheter into the right external carotid artery proved unsuccessful due to  branch takeoff angle. The Envoy catheter was then exchanged over the wire and under biplane roadmap for a 4 [place] Berenstein catheter. Next, the catheter was placed into the right external carotid artery. Frontal and lateral angiograms of the neck and head/face were obtained. The Berenstein 2 catheter was then exchanged over the wire and under biplane roadmap for a 6 [place] envoy which was placed into into the right external carotid artery. Right sphenopalatine artery embolization was then carried out as described below. The catheter was subsequently withdrawn. A right common femoral artery angiogram was obtained in right anterior oblique view. FINDINGS: 1. Normal caliber of the right common femoral artery, adequate for vascular access and closure device utilization. 2. Prominent mucosal blush in the nasal cavity on the left with contrast pooling in the inferior and middle meatus. 3. Normal appearing right nasal cavity mucosal blush. 4. Normal appearance of the bilateral carotid bifurcation without hemodynamically significant stenosis. 5. Brisk contrast opacification of the bilateral MCA and ACA vascular tree without significant vascular abnormality. PROCEDURE: After left-sided diagnostic angiogram, the Berenstein 2 catheter was exchanged over the wire and biplane roadmap for a 6 French envoy DA which was placed into the left external carotid artery. Magnified frontal and lateral angiograms of the face were obtained. Using biplane roadmap, a Prowler select Plus microcatheter was navigated over a synchro 2 micro guidewire into the left sphenopalatine artery. Magnified frontal and lateral angiograms were obtained via microcatheter contrast injection. Subsequently, left sphenopalatine artery embolization was carried out with 250-350 microns PVA particles suspension with iodine contrast for opacification. Follow-up angiogram showed near complete embolization. The catheter was slightly pulled back more proximally and  additional embolization with PVA particles was performed. The microcatheter was retracted under continuous aspiration. 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 The Envoy catheter was then placed into the left internal carotid artery. Frontal and lateral angiograms of the head were obtained with no evidence of nontarget embolization. Mild nasal septum blush is noted via ethmoidal branches of the left ophthalmic artery. After right-sided diagnostic angiogram, a Prowler select Plus microcatheter was navigated over a synchro 2 micro guidewire into the right sphenopalatine artery under biplane roadmap guidance. Magnified frontal and lateral angiograms were obtained via microcatheter contrast injection. Subsequently, right sphenopalatine artery embolization was carried out with 250-350 microns PVA particles suspension with iodine contrast for opacification. 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 The Envoy catheter was then placed into the right internal carotid artery. Frontal and lateral angiograms of the head were obtained with no evidence of nontarget embolization. At the end of the procedure, the 8 French right common femoral artery sheath was exchanged over the wire for a Perclose ProGlide which was utilized for access closure. Immediate hemostasis was achieved. IMPRESSION: 1. Prominent mucosal  blush in the left nasal cavity with active contrast extravasation. 2. Successful and uncomplicated endovascular embolization of the bilateral sphenopalatine arteries with polyvinyl alcohol particles for treatment of epistaxis refractory to nasal packing. 3. Residual mucosal blush of the nasal septum via anterior ethmoidal branches of the left ophthalmic artery. PLAN: Patient will remain intubated due to aspiration risk and will be transferred to ICU for continued management. Electronically Signed   By: Pedro Earls M.D.   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 Presented to there ED at [facility] with generalized weakness, and difficulty ambulating. Patient developed acute bleeding of the left nare on 11.14.22 at 22:30. Rhinorocket placed for hemostasis was unsuccessful. 8 cm Merocel sponge and Afrin attempted by ENT also unsuccessful. ENT placed a Epistat catheter with 19 ml balloon. She comes to our service for a cerebral angiogram with endovascular epistaxis embolization. EXAM: ULTRASOUND-GUIDED VASCULAR ACCESS DIAGNOSTIC CEREBRAL ANGIOGRAM VASCULAR EMBOLIZATION OF BILATERAL SPHENOPALATINE ARTERIES COMPARISON:  None. MEDICATIONS: Ancef 2 gm IV. The antibiotic was administered within 1 hour of the procedure. ANESTHESIA/SEDATION: The procedure was performed under general anesthesia. CONTRAST:  170 mL of Omnipaque 300 milligram/mL FLUOROSCOPY TIME:  Fluoroscopy Time: 45 minutes 42 seconds (1979 mGy). COMPLICATIONS: None immediate. TECHNIQUE: Informed written consent was obtained from the patient and her husband after a thorough discussion of the procedural risks, benefits and alternatives. All questions were addressed. Maximal Sterile Barrier Technique was utilized including caps, mask, sterile gowns, sterile gloves, sterile drape, hand hygiene and skin antiseptic. A timeout was performed prior to the initiation of the procedure. The right groin was prepped and draped in the usual sterile fashion. Using a micropuncture kit and the modified Seldinger technique, access was gained to the right common femoral artery and an 8 French sheath was placed. Real-time ultrasound guidance was utilized for vascular access including the acquisition of a permanent ultrasound image  documenting patency of the accessed vessel. Under fluoroscopy, a 4 [place] Berenstein 2 catheter was navigated over a 0.035" Terumo Glidewire into the aortic arch. The catheter was placed into the left common carotid artery. Frontal and lateral angiograms of the neck were obtained. The catheter was then placed into the left internal carotid artery. Frontal and lateral angiograms of the head were obtained. Next, the catheter was placed into the left external carotid artery. Frontal and lateral angiograms of the neck and head/face were obtained. Left sphenopalatine artery embolization was then carried out as described below. Next, the Envoy catheter was retracted into the aortic arch and then navigated to the right common carotid artery. Frontal and lateral angiograms of the neck were obtained. The catheter was then placed into the right internal carotid artery. Frontal and lateral angiograms of the head were obtained. Attempts to navigate the Envoy catheter into the right external carotid artery proved unsuccessful due to branch takeoff angle. The Envoy catheter was then exchanged over the wire and under biplane roadmap for a 4 [place] Berenstein catheter. Next, the catheter was placed into the right external carotid artery. Frontal and lateral angiograms of the neck and head/face were obtained. The Berenstein 2 catheter was then exchanged over the wire and under biplane roadmap for a 6 [place] envoy which was placed into into the right external carotid artery. Right sphenopalatine artery embolization  was then carried out as described below. The catheter was subsequently withdrawn. A right common femoral artery angiogram was obtained in right anterior oblique view. FINDINGS: 1. Normal caliber of the right common femoral artery, adequate for vascular access and closure device utilization. 2. Prominent mucosal blush in the nasal cavity on the left with contrast pooling in the inferior and middle meatus. 3. Normal  appearing right nasal cavity mucosal blush. 4. Normal appearance of the bilateral carotid bifurcation without hemodynamically significant stenosis. 5. Brisk contrast opacification of the bilateral MCA and ACA vascular tree without significant vascular abnormality. PROCEDURE: After left-sided diagnostic angiogram, the Berenstein 2 catheter was exchanged over the wire and biplane roadmap for a 6 French envoy DA which was placed into the left external carotid artery. Magnified frontal and lateral angiograms of the face were obtained. Using biplane roadmap, a Prowler select Plus microcatheter was navigated over a synchro 2 micro guidewire into the left sphenopalatine artery. Magnified frontal and lateral angiograms were obtained via microcatheter contrast injection. Subsequently, left sphenopalatine artery embolization was carried out with 250-350 microns PVA particles suspension with iodine contrast for opacification. Follow-up angiogram showed near complete embolization. The catheter was slightly pulled back more proximally and additional embolization with PVA particles was performed. The microcatheter was retracted under continuous aspiration. 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 The Envoy catheter was then placed into the left internal carotid artery. Frontal and lateral angiograms of the head were obtained with no evidence of nontarget embolization. Mild nasal septum blush is noted via ethmoidal branches of the left ophthalmic artery. After right-sided diagnostic angiogram, a Prowler select Plus microcatheter was navigated over a synchro 2 micro guidewire into the right sphenopalatine artery under biplane roadmap guidance. Magnified frontal and lateral angiograms were obtained via microcatheter contrast injection. Subsequently, right sphenopalatine artery embolization was carried out with 250-350 microns PVA particles suspension with iodine contrast for  opacification. 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 The Envoy catheter was then placed into the right internal carotid artery. Frontal and lateral angiograms of the head were obtained with no evidence of nontarget embolization. At the end of the procedure, the 8 French right common femoral artery sheath was exchanged over the wire for a Perclose ProGlide which was utilized for access closure. Immediate hemostasis was achieved. IMPRESSION: 1. Prominent mucosal blush in the left nasal cavity with active contrast extravasation. 2. Successful and uncomplicated endovascular embolization of the bilateral sphenopalatine arteries with polyvinyl alcohol particles for treatment of epistaxis refractory to nasal packing. 3. Residual mucosal blush of the nasal septum via anterior ethmoidal branches of the left ophthalmic artery. PLAN: Patient will remain intubated due to aspiration risk and will be transferred to ICU for continued management. Electronically Signed   By: Pedro Earls M.D.   On: 07/18/2021 14:08   DG Chest Port 1 View  Result Date: 07/19/2021 CLINICAL DATA:  Intubated, ventilated EXAM: PORTABLE CHEST 1 VIEW COMPARISON:  Chest radiograph dated 1 day prior FINDINGS: The endotracheal tube has been retracted and now projects approximately 4.0 cm from the carina. There is a new enteric catheter in place with the tip coursing off the field of view. The cardiomediastinal silhouette is normal. The lungs are clear, without focal consolidation or pulmonary edema. There is no pleural effusion or pneumothorax. There is no acute osseous abnormality. IMPRESSION: 1. Interval retraction of the endotracheal tube tip which is now in satisfactory position. 2. Clear lungs.  Electronically Signed   By: Valetta Mole M.D.   On: 07/19/2021 08:08   DG Chest Port 1 View  Result Date: 07/18/2021 CLINICAL DATA:  Endotracheal tube placement. EXAM: PORTABLE CHEST  1 VIEW COMPARISON:  07/14/2021 FINDINGS: No 633 hours. Low lung volumes. Cardiopericardial silhouette is at upper limits of normal for size. The lungs are clear without focal pneumonia, edema, pneumothorax or pleural effusion. Endotracheal tube tip is just into the right mainstem bronchus and could be retracted 3 cm for more appropriate positioning. Bones are diffusely demineralized. Telemetry leads overlie the chest. IMPRESSION: 1. Endotracheal tube tip just into the right mainstem bronchus. Recommend retracting 3 cm for more appropriate positioning. 2. Low lung volumes without acute cardiopulmonary findings. I called these results to the patient's nurse, Loma Sousa, at approximately 0656 hours on 07/18/2021. Electronically Signed   By: Misty Stanley M.D.   On: 07/18/2021 06:57   DG Abd Portable 1V  Result Date: 07/18/2021 CLINICAL DATA:  OG tube placement EXAM: PORTABLE ABDOMEN - 1 VIEW COMPARISON:  None. FINDINGS: Enteric tube tip is in the pyloric region. Included bowel gas pattern is unremarkable. IMPRESSION: Enteric tube in pyloric region. Electronically Signed   By: Macy Mis M.D.   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 Presented to there ED at [facility] with generalized weakness, and difficulty ambulating. Patient developed acute bleeding of the left nare on 11.14.22 at 22:30. Rhinorocket placed for hemostasis was unsuccessful. 8 cm Merocel sponge and Afrin attempted by ENT also unsuccessful. ENT placed a Epistat catheter with 19 ml balloon. She comes to our service for a cerebral angiogram with endovascular epistaxis embolization. EXAM: ULTRASOUND-GUIDED VASCULAR ACCESS DIAGNOSTIC CEREBRAL ANGIOGRAM VASCULAR  EMBOLIZATION OF BILATERAL SPHENOPALATINE ARTERIES COMPARISON:  None. MEDICATIONS: Ancef 2 gm IV. The antibiotic was administered within 1 hour of the procedure. ANESTHESIA/SEDATION: The procedure was performed under general anesthesia. CONTRAST:  170 mL of Omnipaque 300 milligram/mL FLUOROSCOPY TIME:  Fluoroscopy Time: 45 minutes 42 seconds (1979 mGy). COMPLICATIONS: None immediate. TECHNIQUE: Informed written consent was obtained from the patient and her husband after a thorough discussion of the procedural risks, benefits and alternatives. All questions were addressed. Maximal Sterile Barrier Technique was utilized including caps, mask, sterile gowns, sterile gloves, sterile drape, hand hygiene and skin antiseptic. A timeout was performed prior to the initiation of the procedure. The right groin was prepped and draped in the usual sterile fashion. Using a micropuncture kit and the modified Seldinger technique, access was gained to the right common femoral artery and an 8 French sheath was placed. Real-time ultrasound guidance was utilized for vascular access including the acquisition of a permanent ultrasound image documenting patency of the accessed vessel. Under fluoroscopy, a 4 [place] Berenstein 2 catheter was navigated over a 0.035" Terumo Glidewire into the aortic arch. The catheter was placed into the left common carotid artery. Frontal and lateral angiograms of the neck were obtained. The catheter was then placed into the left internal carotid artery. Frontal and lateral angiograms of the head were obtained. Next, the catheter was placed into the left external carotid artery. Frontal and lateral angiograms of the neck and head/face were obtained. Left sphenopalatine artery embolization was then carried out as described below. Next, the Envoy catheter was retracted into the  aortic arch and then navigated to the right common carotid artery. Frontal and lateral angiograms of the neck were obtained. The  catheter was then placed into the right internal carotid artery. Frontal and lateral angiograms of the head were obtained. Attempts to navigate the Envoy catheter into the right external carotid artery proved unsuccessful due to branch takeoff angle. The Envoy catheter was then exchanged over the wire and under biplane roadmap for a 4 [place] Berenstein catheter. Next, the catheter was placed into the right external carotid artery. Frontal and lateral angiograms of the neck and head/face were obtained. The Berenstein 2 catheter was then exchanged over the wire and under biplane roadmap for a 6 [place] envoy which was placed into into the right external carotid artery. Right sphenopalatine artery embolization was then carried out as described below. The catheter was subsequently withdrawn. A right common femoral artery angiogram was obtained in right anterior oblique view. FINDINGS: 1. Normal caliber of the right common femoral artery, adequate for vascular access and closure device utilization. 2. Prominent mucosal blush in the nasal cavity on the left with contrast pooling in the inferior and middle meatus. 3. Normal appearing right nasal cavity mucosal blush. 4. Normal appearance of the bilateral carotid bifurcation without hemodynamically significant stenosis. 5. Brisk contrast opacification of the bilateral MCA and ACA vascular tree without significant vascular abnormality. PROCEDURE: After left-sided diagnostic angiogram, the Berenstein 2 catheter was exchanged over the wire and biplane roadmap for a 6 French envoy DA which was placed into the left external carotid artery. Magnified frontal and lateral angiograms of the face were obtained. Using biplane roadmap, a Prowler select Plus microcatheter was navigated over a synchro 2 micro guidewire into the left sphenopalatine artery. Magnified frontal and lateral angiograms were obtained via microcatheter contrast injection. Subsequently, left sphenopalatine artery  embolization was carried out with 250-350 microns PVA particles suspension with iodine contrast for opacification. Follow-up angiogram showed near complete embolization. The catheter was slightly pulled back more proximally and additional embolization with PVA particles was performed. The microcatheter was retracted under continuous aspiration. 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 The Envoy catheter was then placed into the left internal carotid artery. Frontal and lateral angiograms of the head were obtained with no evidence of nontarget embolization. Mild nasal septum blush is noted via ethmoidal branches of the left ophthalmic artery. After right-sided diagnostic angiogram, a Prowler select Plus microcatheter was navigated over a synchro 2 micro guidewire into the right sphenopalatine artery under biplane roadmap guidance. Magnified frontal and lateral angiograms were obtained via microcatheter contrast injection. Subsequently, right sphenopalatine artery embolization was carried out with 250-350 microns PVA particles suspension with iodine contrast for opacification. 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 The Envoy catheter was then placed into the right internal carotid artery. Frontal and lateral angiograms of the head were obtained with no evidence of nontarget embolization. At the end of the procedure, the 8 French right common femoral artery sheath was exchanged over the wire for a Perclose ProGlide which was utilized for access closure. Immediate hemostasis was achieved. IMPRESSION: 1. Prominent mucosal blush in the left nasal cavity with active contrast extravasation. 2. Successful and uncomplicated endovascular embolization of the bilateral sphenopalatine arteries with polyvinyl alcohol particles for treatment of epistaxis refractory to nasal packing. 3. Residual mucosal blush of the nasal septum  via anterior ethmoidal branches of the left ophthalmic artery. PLAN: Patient will remain intubated due to aspiration  risk and will be transferred to ICU for continued management. Electronically Signed   By: Pedro Earls M.D.   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 Presented to there ED at [facility] with generalized weakness, and difficulty ambulating. Patient developed acute bleeding of the left nare on 11.14.22 at 22:30. Rhinorocket placed for hemostasis was unsuccessful. 8 cm Merocel sponge and Afrin attempted by ENT also unsuccessful. ENT placed a Epistat catheter with 19 ml balloon. She comes to our service for a cerebral angiogram with endovascular epistaxis embolization. EXAM: ULTRASOUND-GUIDED VASCULAR ACCESS DIAGNOSTIC CEREBRAL ANGIOGRAM VASCULAR EMBOLIZATION OF BILATERAL SPHENOPALATINE ARTERIES COMPARISON:  None. MEDICATIONS: Ancef 2 gm IV. The antibiotic was administered within 1 hour of the procedure. ANESTHESIA/SEDATION: The procedure was performed under general anesthesia. CONTRAST:  170 mL of Omnipaque 300 milligram/mL FLUOROSCOPY TIME:  Fluoroscopy Time: 45 minutes 42 seconds (1979 mGy). COMPLICATIONS: None immediate. TECHNIQUE: Informed written consent was obtained from the patient and her husband after a thorough discussion of the procedural risks, benefits and alternatives. All questions were addressed. Maximal Sterile Barrier Technique was utilized including caps, mask, sterile gowns, sterile gloves, sterile drape, hand hygiene and skin antiseptic. A timeout was performed prior to the initiation of the procedure. The right groin was prepped and draped in the usual sterile fashion. Using a micropuncture  kit and the modified Seldinger technique, access was gained to the right common femoral artery and an 8 French sheath was placed. Real-time ultrasound guidance was utilized for vascular access including the acquisition of a permanent ultrasound image documenting patency of the accessed vessel. Under fluoroscopy, a 4 [place] Berenstein 2 catheter was navigated over a 0.035" Terumo Glidewire into the aortic arch. The catheter was placed into the left common carotid artery. Frontal and lateral angiograms of the neck were obtained. The catheter was then placed into the left internal carotid artery. Frontal and lateral angiograms of the head were obtained. Next, the catheter was placed into the left external carotid artery. Frontal and lateral angiograms of the neck and head/face were obtained. Left sphenopalatine artery embolization was then carried out as described below. Next, the Envoy catheter was retracted into the aortic arch and then navigated to the right common carotid artery. Frontal and lateral angiograms of the neck were obtained. The catheter was then placed into the right internal carotid artery. Frontal and lateral angiograms of the head were obtained. Attempts to navigate the Envoy catheter into the right external carotid artery proved unsuccessful due to branch takeoff angle. The Envoy catheter was then exchanged over the wire and under biplane roadmap for a 4 [place] Berenstein catheter. Next, the catheter was placed into the right external carotid artery. Frontal and lateral angiograms of the neck and head/face were obtained. The Berenstein 2 catheter was then exchanged over the wire and under biplane roadmap for a 6 [place] envoy which was placed into into the right external carotid artery. Right sphenopalatine artery embolization was then carried out as described below. The catheter was subsequently withdrawn. A right common femoral artery angiogram was obtained in right anterior oblique view.  FINDINGS: 1. Normal caliber of the right common femoral artery, adequate for vascular access and closure device utilization. 2. Prominent mucosal blush in the nasal cavity on the left  with contrast pooling in the inferior and middle meatus. 3. Normal appearing right nasal cavity mucosal blush. 4. Normal appearance of the bilateral carotid bifurcation without hemodynamically significant stenosis. 5. Brisk contrast opacification of the bilateral MCA and ACA vascular tree without significant vascular abnormality. PROCEDURE: After left-sided diagnostic angiogram, the Berenstein 2 catheter was exchanged over the wire and biplane roadmap for a 6 French envoy DA which was placed into the left external carotid artery. Magnified frontal and lateral angiograms of the face were obtained. Using biplane roadmap, a Prowler select Plus microcatheter was navigated over a synchro 2 micro guidewire into the left sphenopalatine artery. Magnified frontal and lateral angiograms were obtained via microcatheter contrast injection. Subsequently, left sphenopalatine artery embolization was carried out with 250-350 microns PVA particles suspension with iodine contrast for opacification. Follow-up angiogram showed near complete embolization. The catheter was slightly pulled back more proximally and additional embolization with PVA particles was performed. The microcatheter was retracted under continuous aspiration. 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 The Envoy catheter was then placed into the left internal carotid artery. Frontal and lateral angiograms of the head were obtained with no evidence of nontarget embolization. Mild nasal septum blush is noted via ethmoidal branches of the left ophthalmic artery. After right-sided diagnostic angiogram, a Prowler select Plus microcatheter was navigated over a synchro 2 micro guidewire into the right sphenopalatine artery under biplane roadmap  guidance. Magnified frontal and lateral angiograms were obtained via microcatheter contrast injection. Subsequently, right sphenopalatine artery embolization was carried out with 250-350 microns PVA particles suspension with iodine contrast for opacification. 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 The Envoy catheter was then placed into the right internal carotid artery. Frontal and lateral angiograms of the head were obtained with no evidence of nontarget embolization. At the end of the procedure, the 8 French right common femoral artery sheath was exchanged over the wire for a Perclose ProGlide which was utilized for access closure. Immediate hemostasis was achieved. IMPRESSION: 1. Prominent mucosal blush in the left nasal cavity with active contrast extravasation. 2. Successful and uncomplicated endovascular embolization of the bilateral sphenopalatine arteries with polyvinyl alcohol particles for treatment of epistaxis refractory to nasal packing. 3. Residual mucosal blush of the nasal septum via anterior ethmoidal branches of the left ophthalmic artery. PLAN: Patient will remain intubated due to aspiration risk and will be transferred to ICU for continued management. Electronically Signed   By: Pedro Earls M.D.   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 Presented to there ED at [facility] with generalized weakness, and difficulty ambulating. Patient developed acute bleeding of the left nare on 11.14.22 at 22:30. Rhinorocket placed for hemostasis was unsuccessful. 8 cm Merocel sponge and Afrin attempted by ENT also unsuccessful. ENT placed a Epistat catheter with 19 ml balloon.  She comes to our service for a cerebral angiogram with endovascular epistaxis embolization. EXAM: ULTRASOUND-GUIDED VASCULAR ACCESS DIAGNOSTIC CEREBRAL ANGIOGRAM VASCULAR EMBOLIZATION OF BILATERAL SPHENOPALATINE ARTERIES COMPARISON:  None. MEDICATIONS: Ancef 2 gm IV. The antibiotic was administered within 1 hour of the procedure. ANESTHESIA/SEDATION: The procedure was performed under general anesthesia. CONTRAST:  170 mL of Omnipaque 300 milligram/mL FLUOROSCOPY TIME:  Fluoroscopy Time: 45 minutes 42  seconds (1979 mGy). COMPLICATIONS: None immediate. TECHNIQUE: Informed written consent was obtained from the patient and her husband after a thorough discussion of the procedural risks, benefits and alternatives. All questions were addressed. Maximal Sterile Barrier Technique was utilized including caps, mask, sterile gowns, sterile gloves, sterile drape, hand hygiene and skin antiseptic. A timeout was performed prior to the initiation of the procedure. The right groin was prepped and draped in the usual sterile fashion. Using a micropuncture kit and the modified Seldinger technique, access was gained to the right common femoral artery and an 8 French sheath was placed. Real-time ultrasound guidance was utilized for vascular access including the acquisition of a permanent ultrasound image documenting patency of the accessed vessel. Under fluoroscopy, a 4 [place] Berenstein 2 catheter was navigated over a 0.035" Terumo Glidewire into the aortic arch. The catheter was placed into the left common carotid artery. Frontal and lateral angiograms of the neck were obtained. The catheter was then placed into the left internal carotid artery. Frontal and lateral angiograms of the head were obtained. Next, the catheter was placed into the left external carotid artery. Frontal and lateral angiograms of the neck and head/face were obtained. Left sphenopalatine artery embolization was then carried out as described below. Next, the  Envoy catheter was retracted into the aortic arch and then navigated to the right common carotid artery. Frontal and lateral angiograms of the neck were obtained. The catheter was then placed into the right internal carotid artery. Frontal and lateral angiograms of the head were obtained. Attempts to navigate the Envoy catheter into the right external carotid artery proved unsuccessful due to branch takeoff angle. The Envoy catheter was then exchanged over the wire and under biplane roadmap for a 4 [place] Berenstein catheter. Next, the catheter was placed into the right external carotid artery. Frontal and lateral angiograms of the neck and head/face were obtained. The Berenstein 2 catheter was then exchanged over the wire and under biplane roadmap for a 6 [place] envoy which was placed into into the right external carotid artery. Right sphenopalatine artery embolization was then carried out as described below. The catheter was subsequently withdrawn. A right common femoral artery angiogram was obtained in right anterior oblique view. FINDINGS: 1. Normal caliber of the right common femoral artery, adequate for vascular access and closure device utilization. 2. Prominent mucosal blush in the nasal cavity on the left with contrast pooling in the inferior and middle meatus. 3. Normal appearing right nasal cavity mucosal blush. 4. Normal appearance of the bilateral carotid bifurcation without hemodynamically significant stenosis. 5. Brisk contrast opacification of the bilateral MCA and ACA vascular tree without significant vascular abnormality. PROCEDURE: After left-sided diagnostic angiogram, the Berenstein 2 catheter was exchanged over the wire and biplane roadmap for a 6 French envoy DA which was placed into the left external carotid artery. Magnified frontal and lateral angiograms of the face were obtained. Using biplane roadmap, a Prowler select Plus microcatheter was navigated over a synchro 2 micro guidewire into  the left sphenopalatine artery. Magnified frontal and lateral angiograms were obtained via microcatheter contrast injection. Subsequently, left sphenopalatine artery embolization was carried out with 250-350 microns PVA particles suspension with iodine contrast for opacification. Follow-up angiogram showed near complete embolization. The catheter was slightly pulled back more proximally and additional embolization with PVA particles was performed. The microcatheter was retracted under continuous aspiration. 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 The Envoy catheter was then placed into the left internal  carotid artery. Frontal and lateral angiograms of the head were obtained with no evidence of nontarget embolization. Mild nasal septum blush is noted via ethmoidal branches of the left ophthalmic artery. After right-sided diagnostic angiogram, a Prowler select Plus microcatheter was navigated over a synchro 2 micro guidewire into the right sphenopalatine artery under biplane roadmap guidance. Magnified frontal and lateral angiograms were obtained via microcatheter contrast injection. Subsequently, right sphenopalatine artery embolization was carried out with 250-350 microns PVA particles suspension with iodine contrast for opacification. 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 The Envoy catheter was then placed into the right internal carotid artery. Frontal and lateral angiograms of the head were obtained with no evidence of nontarget embolization. At the end of the procedure, the 8 French right common femoral artery sheath was exchanged over the wire for a Perclose ProGlide which was utilized for access closure. Immediate hemostasis was achieved. IMPRESSION: 1. Prominent mucosal blush in the left nasal cavity with active contrast extravasation. 2. Successful and uncomplicated endovascular embolization of the  bilateral sphenopalatine arteries with polyvinyl alcohol particles for treatment of epistaxis refractory to nasal packing. 3. Residual mucosal blush of the nasal septum via anterior ethmoidal branches of the left ophthalmic artery. PLAN: Patient will remain intubated due to aspiration risk and will be transferred to ICU for continued management. Electronically Signed   By: Pedro Earls M.D.   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 Presented to there ED at [facility] with generalized weakness, and difficulty ambulating. Patient developed acute bleeding of the left nare on 11.14.22 at 22:30. Rhinorocket placed for hemostasis was unsuccessful. 8 cm Merocel sponge and Afrin attempted by ENT also unsuccessful. ENT placed a Epistat catheter with 19 ml balloon. She comes to our service for a cerebral angiogram with endovascular epistaxis embolization. EXAM: ULTRASOUND-GUIDED VASCULAR ACCESS DIAGNOSTIC CEREBRAL ANGIOGRAM VASCULAR EMBOLIZATION OF BILATERAL SPHENOPALATINE ARTERIES COMPARISON:  None. MEDICATIONS: Ancef 2 gm IV. The antibiotic was administered within 1 hour of the procedure. ANESTHESIA/SEDATION: The procedure was performed under general anesthesia. CONTRAST:  170 mL of Omnipaque 300 milligram/mL FLUOROSCOPY TIME:  Fluoroscopy Time: 45 minutes 42 seconds (1979 mGy). COMPLICATIONS: None immediate. TECHNIQUE: Informed written consent was obtained from the patient and her husband after a thorough discussion of the procedural risks, benefits and alternatives. All questions were addressed. Maximal Sterile Barrier Technique was utilized including caps, mask, sterile gowns, sterile gloves, sterile drape, hand hygiene and skin antiseptic.  A timeout was performed prior to the initiation of the procedure. The right groin was prepped and draped in the usual sterile fashion. Using a micropuncture kit and the modified Seldinger technique, access was gained to the right common femoral artery and an 8 French sheath was placed. Real-time ultrasound guidance was utilized for vascular access including the acquisition of a permanent ultrasound image documenting patency of the accessed vessel. Under fluoroscopy, a 4 [place] Berenstein 2 catheter was navigated over a 0.035" Terumo Glidewire into the aortic arch. The catheter was placed into the left common carotid artery. Frontal and lateral angiograms of the neck were obtained. The catheter was then placed into the left internal carotid artery. Frontal and lateral angiograms of the head were obtained. Next, the catheter was placed into the left  external carotid artery. Frontal and lateral angiograms of the neck and head/face were obtained. Left sphenopalatine artery embolization was then carried out as described below. Next, the Envoy catheter was retracted into the aortic arch and then navigated to the right common carotid artery. Frontal and lateral angiograms of the neck were obtained. The catheter was then placed into the right internal carotid artery. Frontal and lateral angiograms of the head were obtained. Attempts to navigate the Envoy catheter into the right external carotid artery proved unsuccessful due to branch takeoff angle. The Envoy catheter was then exchanged over the wire and under biplane roadmap for a 4 [place] Berenstein catheter. Next, the catheter was placed into the right external carotid artery. Frontal and lateral angiograms of the neck and head/face were obtained. The Berenstein 2 catheter was then exchanged over the wire and under biplane roadmap for a 6 [place] envoy which was placed into into the right external carotid artery. Right sphenopalatine artery embolization was then carried  out as described below. The catheter was subsequently withdrawn. A right common femoral artery angiogram was obtained in right anterior oblique view. FINDINGS: 1. Normal caliber of the right common femoral artery, adequate for vascular access and closure device utilization. 2. Prominent mucosal blush in the nasal cavity on the left with contrast pooling in the inferior and middle meatus. 3. Normal appearing right nasal cavity mucosal blush. 4. Normal appearance of the bilateral carotid bifurcation without hemodynamically significant stenosis. 5. Brisk contrast opacification of the bilateral MCA and ACA vascular tree without significant vascular abnormality. PROCEDURE: After left-sided diagnostic angiogram, the Berenstein 2 catheter was exchanged over the wire and biplane roadmap for a 6 French envoy DA which was placed into the left external carotid artery. Magnified frontal and lateral angiograms of the face were obtained. Using biplane roadmap, a Prowler select Plus microcatheter was navigated over a synchro 2 micro guidewire into the left sphenopalatine artery. Magnified frontal and lateral angiograms were obtained via microcatheter contrast injection. Subsequently, left sphenopalatine artery embolization was carried out with 250-350 microns PVA particles suspension with iodine contrast for opacification. Follow-up angiogram showed near complete embolization. The catheter was slightly pulled back more proximally and additional embolization with PVA particles was performed. The microcatheter was retracted under continuous aspiration. 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 The Envoy catheter was then placed into the left internal carotid artery. Frontal and lateral angiograms of the head were obtained with no evidence of nontarget embolization. Mild nasal septum blush is noted via ethmoidal branches of the left ophthalmic artery. After right-sided diagnostic  angiogram, a Prowler select Plus microcatheter was navigated over a synchro 2 micro guidewire into the right sphenopalatine artery under biplane roadmap guidance. Magnified frontal and lateral angiograms were obtained via microcatheter contrast injection. Subsequently, right sphenopalatine artery embolization was carried out with 250-350 microns PVA particles suspension with iodine contrast for opacification. 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 The Envoy catheter was then placed into the right internal carotid artery. Frontal and lateral angiograms of the head were obtained with no evidence of nontarget embolization. At the end of the procedure, the 8 French right common femoral artery sheath was exchanged over the wire for a Perclose ProGlide which was utilized for access closure. Immediate hemostasis was achieved. IMPRESSION: 1. Prominent mucosal blush in the left nasal cavity with active contrast extravasation. 2. Successful and uncomplicated endovascular embolization of the bilateral sphenopalatine arteries with polyvinyl alcohol particles for  treatment of epistaxis refractory to nasal packing. 3. Residual mucosal blush of the nasal septum via anterior ethmoidal branches of the left ophthalmic artery. PLAN: Patient will remain intubated due to aspiration risk and will be transferred to ICU for continued management. Electronically Signed   By: Pedro Earls M.D.   On: 07/18/2021 14:08    Labs:  CBC: Recent Labs    07/18/21 0959 07/18/21 1543 07/18/21 2121 07/19/21 0327  WBC 17.5* 18.6* 22.0* 22.0*  HGB 11.5* 11.1* 10.4* 9.9*  HCT 35.8* 34.6* 32.6* 31.8*  PLT 267 294 300 250    COAGS: Recent Labs    07/18/21 0612  INR 1.3*    BMP: Recent Labs    07/14/21 1900 07/15/21 0318 07/18/21 0246 07/18/21 0305 07/18/21 0607 07/19/21 0327  NA 138 141 138 137 137 139  K 4.3 4.0 7.4* 3.7 3.9 4.3  CL 102 103 104 105  --   106  CO2 26 27  --  23  --  22  GLUCOSE 121* 102* 115* 150*  --  130*  BUN 22 18 34* 19  --  14  CALCIUM 9.0 9.0  --  8.6*  --  8.4*  CREATININE 0.86 0.75 0.80 0.81  --  0.79  GFRNONAA >60 >60  --  >60  --  >60    LIVER FUNCTION TESTS: Recent Labs    03/15/21 0125 04/14/21 1431 07/14/21 1900  BILITOT 0.4 0.2* 0.4  AST 26 23 40  ALT 17 17 33  ALKPHOS 82 73 100  PROT 6.9 5.9* 6.2*  ALBUMIN 3.5 3.1* 3.2*    Assessment and Plan:  Pt underwent cerebral angiogram with endovascular epistaxis embolization 07/18/2021 with Dr. Harrold Donath. Pt denies pain, new bleeding or N/V. Pt extubated and on O2 face mask. No respiratory distress noted.  Pt has been treated with Kcentra and Vitamin K. Balloon catheter port noted to be in place in L nare. Pt husband at bedside. He confirms no new bleeding.   Further plan of care per Otolaryngology/IM/IR Please contact IR with questions/concerns.  Electronically Signed: Tyson Alias, NP 07/19/2021, 11:50 AM   I spent a total of 25 Minutes at the the patient's bedside AND on the patient's hospital floor or unit, greater than 50% of which was counseling/coordinating care for cerebral angiogram with endovascular epistaxis embolization.

## 2021-07-19 NOTE — Progress Notes (Signed)
[facility] ADULT ICU REPLACEMENT PROTOCOL   The patient does apply for the [facility] Adult ICU Electrolyte Replacment Protocol based on the criteria listed below:   1.Exclusion criteria: TCTS patients, ECMO patients, and Dialysis patients 2. Is GFR >/= 30 ml/min? Yes.    Patient's GFR today is >60 3. Is SCr </= 2? Yes.   Patient's SCr is 0.79 mg/dL 4. Did SCr increase >/= 0.5 in 24 hours? No. 5.Pt's weight >40kg  Yes.   6. Abnormal electrolyte(s): mag 1.8  7. Electrolytes replaced per protocol 8.  Call MD STAT for K+ </= 2.5, Phos </= 1, or Mag </= 1 Physician:  n/a  Katelyn Lamb 07/19/2021 6:37 AM

## 2021-07-19 NOTE — Procedures (Signed)
Extubation Procedure Note  Patient Details:   Name: CIELA MAHAJAN DOB: 1946-03-23 MRN: [id number]   Airway Documentation:    Vent end date: 07/19/21 Vent end time: 0915   Evaluation  O2 sats: stable throughout Complications: No apparent complications Patient did tolerate procedure well. Bilateral Breath Sounds: Rhonchi   Yes  Patient was extubated to a 40% venti mask due to nose bleed without any complications, dyspnea or stridor noted.   Lauriana Denes L 07/19/2021, 9:15 AM

## 2021-07-19 NOTE — Progress Notes (Signed)
Patient has been stable overnight without additional bleeding.  She has now received two doses of Kcentra and was extubated this morning.  HB very slightly decreased from 11/15.  I removed saline from both balloons today. No bleeding.  Remaining saline:   Anterior - 12 cc  Posterior - 5 cc  Will leave in place until tomorrow.  Remove in AM if still no bleeding at that time.

## 2021-07-19 NOTE — Progress Notes (Signed)
Physical Therapy Treatment Patient Details Name: Katelyn Lamb MRN: [id number] DOB: 13-Jul-1946 Today's Date: 07/19/2021   History of Present Illness Pt is a 75 y.o. F who presents 07/14/2021 with working diagnosis of catheter associated UTI - likely EBSL and increasing bilateral lower extremity weakness. Patient with epistaxis. s/p embolization of L IMAX on 11/15. Intubated 11/15-11/16. 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    PT Comments    Patient seen s/p embolization following epistaxis. Patient on face tent with 5L O2, 28% FiO2 with VSS. HR max 140 during session. Requires max encouragement to perform mobility this date. Requires maxA+2 for bed mobility and sit to stand transfer ~2-3 seconds. RN present to assist during session. Continue to recommend CIR level therapies to assist with maximizing functional mobility and safety.     Recommendations for follow up therapy are one component of a multi-disciplinary discharge planning process, led by the attending physician.  Recommendations may be updated based on patient status, additional functional criteria and insurance authorization.  Follow Up Recommendations  Acute inpatient rehab (3hours/day)     Assistance Recommended at Discharge Frequent or [facility] bed;Other (comment) (hoyer lift)    Recommendations for Other Services       Precautions / Restrictions Precautions Precautions: Fall Restrictions Weight Bearing Restrictions: No     Mobility  Bed Mobility Overal bed mobility: Needs Assistance Bed Mobility: Rolling;Sidelying to Sit;Sit to Supine Rolling: Mod assist Sidelying to sit: Mod assist;+2 for physical assistance   Sit to supine: Max assist;+2 for physical assistance;+2 for safety/equipment   General bed mobility comments:  requires consistent verbal cueing and assist to initiate bed mobility. Difficulty sequencing throughout.    Transfers Overall transfer level: Needs assistance Equipment used: Rolling Anarie Kalish (2 wheels) Transfers: Sit to/from Stand Sit to Stand: Max assist;+2 physical assistance;+2 safety/equipment           General transfer comment: max encouragement to perform stand this session. MaxA+2 from bed to stand for ~2-3 seconds.    Ambulation/Gait                   Stairs             Wheelchair Mobility    Modified Rankin (Stroke Patients Only)       Balance Overall balance assessment: Needs assistance Sitting-balance support: Feet unsupported Sitting balance-Leahy Scale: Poor Sitting balance - Comments: L lateral lean Postural control: Left lateral lean Standing balance support: Bilateral upper extremity supported Standing balance-Leahy Scale: Zero                              Cognition Arousal/Alertness: Awake/alert Behavior During Therapy: Flat affect Overall Cognitive Status: Impaired/Different from baseline Area of Impairment: Following commands;Problem solving;Orientation;Memory;Attention;Safety/judgement;Awareness                 Orientation Level: Disoriented to;Situation Current Attention Level: Sustained Memory: Decreased short-term memory Following Commands: Follows one step commands with increased time Safety/Judgement: Decreased awareness of safety;Decreased awareness of deficits Awareness: Emergent Problem Solving: Slow processing;Decreased initiation;Requires verbal cues General Comments: slow processing throughout. Following commands with increased time. Disoriented to full situation. Able to state "nosebleed" but unsure of why she came to hospital        Exercises      General Comments General comments (skin integrity, edema, etc.): On face  tent, 5L O2 28% FiO2      Pertinent Vitals/Pain Pain Assessment:  Faces Faces Pain Scale: No hurt Pain Intervention(s): Monitored during session    Home Living                          Prior Function            PT Goals (current goals can now be found in the care plan section) Acute Rehab PT Goals Patient Stated Goal: pt spouse would like pt to be able to get to a level where he can assist her PT Goal Formulation: With patient/family Time For Goal Achievement: 07/31/21 Potential to Achieve Goals: Good Progress towards PT goals: Progressing toward goals    Frequency    Min 3X/week      PT Plan Current plan remains appropriate    Co-evaluation              AM-PAC PT "6 Clicks" Mobility   Outcome Measure  Help needed turning from your back to your side while in a flat bed without using bedrails?: Total Help needed moving from lying on your back to sitting on the side of a flat bed without using bedrails?: Total Help needed moving to and from a bed to a chair (including a wheelchair)?: Total Help needed standing up from a chair using your arms (e.g., wheelchair or bedside chair)?: Total Help needed to walk in hospital room?: Total Help needed climbing 3-5 steps with a railing? : Total 6 Click Score: 6    End of Session Equipment Utilized During Treatment: Oxygen Activity Tolerance: Patient tolerated treatment well Patient left: in bed;with call bell/phone within reach;with bed alarm set;with nursing/sitter in room Nurse Communication: Mobility status PT Visit Diagnosis: Unsteadiness on feet (R26.81);Muscle weakness (generalized) (M62.81);History of falling (Z91.81);Difficulty in walking, not elsewhere classified (R26.2)     Time: 3491-7915 PT Time Calculation (min) (ACUTE ONLY): 28 min  Charges:  $Therapeutic Activity: 23-37 mins                     Amoy Steeves A. Gilford Rile PT, DPT Acute Rehabilitation Services Pager [phone] Office [phone]    Linna Hoff 07/19/2021, 5:16 PM

## 2021-07-19 NOTE — Plan of Care (Signed)
Spoke with Dr. Diona Fanti from urology for advise on further management of leakage around the suprapubic catheter site. He recommends catheter exchange. If leakage continues to be an issue, we will plan on obtaining a bladder ultrasound and will formally consult.

## 2021-07-19 NOTE — Progress Notes (Addendum)
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  Admitted for UTI and started on meropenem.   ID thought likely colonization, abtx held. 11/12  Developed bleeding from L nare this evening at 1030.  Rhinorocket initially placed with only partial resolution of bleeding.  ENT placed epstat catheter, still bleeding thought significantly slowed, but Patient intubated for IR procedure for embolization of L sphenopalatine artery .  Will remain intubated for airway protection, as  she sill has significant bleeding.      Pertinent  Medical History  Myelitis due to HSV Neurogenic bladder  DVT hx  Ckd Encephalomyelitis  Significant Hospital Events: Including procedures, antibiotic start and stop dates in addition to other pertinent events   11/15 interventional radiology for embolization of left sphenopalatine artery  Interim History / Subjective:  Bleeding appears to be controlled this morning. Hemoglobin is down ~1g from yesterday but remains hemodynamically stable.   Objective   Blood pressure 107/77, pulse (!) 105, temperature 97.6 F (36.4 C), temperature source Axillary, resp. rate 20, height 5\' 7"  (1.702 m), weight 76.2 kg, SpO2 99 %.    Vent Mode: PRVC FiO2 (%):  [40 %] 40 % Set Rate:  [20 bmp] 20 bmp Vt Set:  [490 mL] 490 mL PEEP:  [5 cmH20] 5 cmH20 Plateau Pressure:  [13 cmH20-18 cmH20] 16 cmH20   Intake/Output Summary (Last 24 hours) at 07/19/2021 0815 Last data filed at 07/19/2021 0700 Gross per 24 hour  Intake 1405.8 ml  Output 2025 ml  Net -619.2 ml    Filed Weights   07/14/21 2100 07/18/21 0924 07/19/21 0421  Weight: 68 kg 78  kg 76.2 kg    Examination: General: NAD intubated  HENT: packed L nare  Lungs: ETT, lungs clear Cardiovascular: tachycardic rate, regular rhythm  Abdomen: nt, nd, nbs  Extremities: no edema no erythema Neuro: sedated on propofol, intubated    Resolved Hospital Problem list     Assessment & Plan:  Epistaxis left nare: S/p IR embolization of left sphenopalatine artery, but with persistent bleeding Acute respiratory failure requiring mechanical ventilation due to epistaxis, airway protection In the setting of xarelto use; s/p kcentra 11/15 Bleeding appears to be controlled this morning. Hemoglobin down about 1g since yesterday but she remains hemodynamically stable Leukocytosis likely reactive -appreciate ENT management -trend hemoglobin; transfuse for hemoglobin <7 -weaning well this morning. RBSI 23. Will plan to extubate.  Transverse myelitis Appears that she had been becoming weaker in the months leading up to admission, multiple telephone encounters between her husband and neurology. Recent CTH/MRI in August had not revealed any acute changes. -Continue her chronic prednisone -Will need to assess weakness once more awake. Can consider neurology consult if needed  Neurogenic bladder secondary to above Suprapubic cath exchanged in the ED, now leaking. Currently has 48F, will need to see what her home size is. Once that is done, we can look into finding someone to exchange it here if needed  Chronic pain syndrome: -Continue Lyrica, Keppra   History of DVT -Xarelto on hold  Best Practice (right click and "Reselect all SmartList Selections" daily)   Diet/type: tubefeeds DVT prophylaxis: other GI prophylaxis: N/A Lines: N/A  Foley:  N/A Code Status:  full code Last date of multidisciplinary goals of care discussion [pending] Family: Husband updated at bedside 11/15  Labs   CBC: Recent Labs  Lab 07/14/21 1900 07/15/21 0318 07/18/21 0305 07/18/21 0607 07/18/21 0959  07/18/21 1543 07/18/21 2121 07/19/21 0327  WBC 9.9   < > 12.7*  --  17.5* 18.6* 22.0* 22.0*  NEUTROABS 6.5  --  6.7  --   --   --   --   --   HGB 13.0   < > 12.9 10.2* 11.5* 11.1* 10.4* 9.9*  HCT 41.7   < > 41.0 30.0* 35.8* 34.6* 32.6* 31.8*  MCV 98.3   < > 97.4  --  94.7 94.5 94.5 97.2  PLT 230   < > 259  --  267 294 300 250   < > = values in this interval not displayed.     Basic Metabolic Panel: Recent Labs  Lab 07/14/21 1900 07/15/21 0318 07/18/21 0246 07/18/21 0305 07/18/21 0607 07/19/21 0327  NA 138 141 138 137 137 139  K 4.3 4.0 7.4* 3.7 3.9 4.3  CL 102 103 104 105  --  106  CO2 26 27  --  23  --  22  GLUCOSE 121* 102* 115* 150*  --  130*  BUN 22 18 34* 19  --  14  CREATININE 0.86 0.75 0.80 0.81  --  0.79  CALCIUM 9.0 9.0  --  8.6*  --  8.4*  MG  --   --   --   --   --  1.8    GFR: Estimated Creatinine Clearance: 66.6 mL/min (by C-G formula based on SCr of 0.79 mg/dL). Recent Labs  Lab 07/18/21 0959 07/18/21 1543 07/18/21 2121 07/19/21 0327  WBC 17.5* 18.6* 22.0* 22.0*     Liver Function Tests: Recent Labs  Lab 07/14/21 1900  AST 40  ALT 33  ALKPHOS 100  BILITOT 0.4  PROT 6.2*  ALBUMIN 3.2*    Recent Labs  Lab 07/14/21 1900  LIPASE 38    No results for input(s): AMMONIA in the last 168 hours.  ABG    Component Value Date/Time   PHART 7.424 07/18/2021 0607   PCO2ART 41.3 07/18/2021 0607   PO2ART 450 (H) 07/18/2021 0607   HCO3 27.4 07/18/2021 0607   TCO2 29 07/18/2021 0607   ACIDBASEDEF 0.9 02/12/2020 1220   O2SAT 100.0 07/18/2021 0607      Coagulation Profile: Recent Labs  Lab 07/18/21 0612  INR 1.3*     Cardiac Enzymes: No results for input(s): CKTOTAL, CKMB, CKMBINDEX, TROPONINI in the last 168 hours.  HbA1C: Hgb A1c MFr Bld  Date/Time Value Ref Range Status  03/15/2021 04:43 AM 5.9 (H) 4.8 - 5.6 % Final    Comment:    (NOTE) Pre diabetes:          5.7%-6.4%  Diabetes:              >6.4%  Glycemic control for    <7.0% adults with diabetes     CBG: Recent Labs  Lab 07/14/21 [place], MD Internal Medicine Resident PGY-3 [name] 07/19/2021 8:31 AM

## 2021-07-20 DIAGNOSIS — J9601 Acute respiratory failure with hypoxia: Secondary | ICD-10-CM | POA: Diagnosis not present

## 2021-07-20 DIAGNOSIS — R040 Epistaxis: Secondary | ICD-10-CM | POA: Diagnosis not present

## 2021-07-20 LAB — CBC
HCT: 25.7 % — ABNORMAL LOW (ref 36.0–46.0)
Hemoglobin: 7.8 g/dL — ABNORMAL LOW (ref 12.0–15.0)
MCH: 30.6 pg (ref 26.0–34.0)
MCHC: 30.4 g/dL (ref 30.0–36.0)
MCV: 100.8 fL — ABNORMAL HIGH (ref 80.0–100.0)
Platelets: 210 10*3/uL (ref 150–400)
RBC: 2.55 MIL/uL — ABNORMAL LOW (ref 3.87–5.11)
RDW: 16.2 % — ABNORMAL HIGH (ref 11.5–15.5)
WBC: 14.1 10*3/uL — ABNORMAL HIGH (ref 4.0–10.5)
nRBC: 0.8 % — ABNORMAL HIGH (ref 0.0–0.2)

## 2021-07-20 LAB — CBC WITH DIFFERENTIAL/PLATELET
Abs Immature Granulocytes: 0.09 10*3/uL — ABNORMAL HIGH (ref 0.00–0.07)
Basophils Absolute: 0.1 10*3/uL (ref 0.0–0.1)
Basophils Relative: 0 %
Eosinophils Absolute: 0 10*3/uL (ref 0.0–0.5)
Eosinophils Relative: 0 %
HCT: 25.7 % — ABNORMAL LOW (ref 36.0–46.0)
Hemoglobin: 8 g/dL — ABNORMAL LOW (ref 12.0–15.0)
Immature Granulocytes: 1 %
Lymphocytes Relative: 36 %
Lymphs Abs: 5 10*3/uL — ABNORMAL HIGH (ref 0.7–4.0)
MCH: 30.7 pg (ref 26.0–34.0)
MCHC: 31.1 g/dL (ref 30.0–36.0)
MCV: 98.5 fL (ref 80.0–100.0)
Monocytes Absolute: 1.1 10*3/uL — ABNORMAL HIGH (ref 0.1–1.0)
Monocytes Relative: 8 %
Neutro Abs: 7.4 10*3/uL (ref 1.7–7.7)
Neutrophils Relative %: 55 %
Platelets: 212 10*3/uL (ref 150–400)
RBC: 2.61 MIL/uL — ABNORMAL LOW (ref 3.87–5.11)
RDW: 16.3 % — ABNORMAL HIGH (ref 11.5–15.5)
WBC: 13.7 10*3/uL — ABNORMAL HIGH (ref 4.0–10.5)
nRBC: 0.3 % — ABNORMAL HIGH (ref 0.0–0.2)

## 2021-07-20 LAB — BASIC METABOLIC PANEL
Anion gap: 6 (ref 5–15)
BUN: 15 mg/dL (ref 8–23)
CO2: 26 mmol/L (ref 22–32)
Calcium: 8.1 mg/dL — ABNORMAL LOW (ref 8.9–10.3)
Chloride: 108 mmol/L (ref 98–111)
Creatinine, Ser: 0.67 mg/dL (ref 0.44–1.00)
GFR, Estimated: >60 mL/min (ref >60)
Glucose, Bld: 115 mg/dL — ABNORMAL HIGH (ref 70–99)
Potassium: 3.7 mmol/L (ref 3.5–5.1)
Sodium: 140 mmol/L (ref 135–145)

## 2021-07-20 MED ORDER — POLYETHYLENE GLYCOL 3350 17 G PO PACK
17.0000 g | PACK | Freq: Two times a day (BID) | ORAL | Status: DC
  Administered 2021-07-20: 10:00:00 17 g via ORAL
  Filled 2021-07-20: qty 1

## 2021-07-20 MED ORDER — SENNOSIDES-DOCUSATE SODIUM 8.6-50 MG PO TABS
2.0000 | ORAL_TABLET | Freq: Two times a day (BID) | ORAL | Status: DC
  Administered 2021-07-20: 11:00:00 2 via ORAL
  Filled 2021-07-20: qty 2

## 2021-07-20 MED ORDER — WHITE PETROLATUM EX OINT
TOPICAL_OINTMENT | CUTANEOUS | Status: AC
  Administered 2021-07-20: 0.2
  Filled 2021-07-20: qty 28.35

## 2021-07-20 MED ORDER — POTASSIUM CHLORIDE CRYS ER 20 MEQ PO TBCR
40.0000 meq | EXTENDED_RELEASE_TABLET | Freq: Once | ORAL | Status: AC
  Administered 2021-07-20: 08:00:00 40 meq via ORAL
  Filled 2021-07-20: qty 2

## 2021-07-20 NOTE — Progress Notes (Signed)
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  Admitted for UTI and started on meropenem.   ID thought likely colonization, abtx held. 11/12  Developed bleeding from L nare this evening at 1030.  Rhinorocket initially placed with only partial resolution of bleeding.  ENT placed epstat catheter, still bleeding thought significantly slowed, but Patient intubated for IR procedure for embolization of L sphenopalatine artery .  Will remain intubated for airway protection, as  she sill has significant bleeding.      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  Respiratory status stable.  No complaints.  No further leaking from the suprapubic cath since yesterday  Objective   Blood pressure 94/66, pulse 100, temperature 98.2 F (36.8 C), temperature source Axillary, resp. rate 16, height 5\' 7"  (1.702 m), weight 76.7 kg, SpO2 96 %.    Vent Mode: CPAP;PSV FiO2 (%):  [28 %-40 %] 28 % PEEP:  [5 cmH20] 5 cmH20 Pressure Support:  [10 cmH20] 10 cmH20   Examination:  General: Chronically ill-appearing female in no acute distress HEENT: Nasal packing in the left nare.  No blood appreciated in the back of the throat. Cardiac: Tachycardic rate, regular rhythm no lower  extremity edema, extremities are warm Pulm: Breathing comfortably on facemask, lung sounds clear throughout   Resolved Hospital Problem list     Assessment & Plan:  Epistaxis left nare: S/p IR embolization of left sphenopalatine artery Blood loss anemia In the setting of xarelto use; s/p kcentra 11/15 Hemoglobin down ~2g from yesterday, down 4g from admission. No major bleeding events overnight. Suspect hemoglobin should stabilize in the next 1-2d Leukocytosis resolving.  -appreciate ENT management -trend hemoglobin twice daily; transfuse for hemoglobin <7 -ENT plans to remove packing today. Will plan to keep her in ICU for 1 more day to monitor for recurrence of major bleeding. If stable, can consider transfer out of the unit tomorrow.   Acute hypoxic respiratory failure  Intubated 11/15-11/16 for airway protection in setting of epistaxis. Now on 5L. Suspect ongoing improvement with removal of nasal packing.  Appears euvolemic -pulm hygeine   Transverse myelitis Appears that she had been becoming weaker in the months leading up to admission, multiple telephone encounters between her husband and neurology. Recent CTH/MRI in August had not revealed any acute changes. -Continue chronic prednisone -Will need to assess weakness once more awake. Can consider neurology consult if needed  Neurogenic bladder secondary to above, complicated by leakage around suprapubic site Normal catheter size: 72F Spoke with urology 11/16 who recommends that we replace the catheter.  We are unable to find the correct size however states she has not had any more leaking around the suprapubic site since yesterday so we will defer catheter exchange for now Continue Myrbetric    Constipation.  Scheduled senokot and miralax. Will escalate as needed.  Chronic pain syndrome: -Continue Lyrica, Keppra   History of DVT -Xarelto on hold  Best Practice (right click and "Reselect all SmartList Selections" daily)    Diet/type: ADAT DVT prophylaxis: other GI prophylaxis: N/A Lines: N/A Foley:  N/A Code Status:  full code Last date of multidisciplinary goals of care discussion [pending] Family: Husband updated at bedside 11/17  Labs   CBC: Recent Labs  Lab 07/14/21 1900 07/15/21 0318 07/18/21 0305 07/18/21 0607 07/18/21 0959 07/18/21 1543 07/18/21 2121 07/19/21 0327 07/20/21 0306  WBC 9.9   < > 12.7*  --  17.5* 18.6* 22.0* 22.0* 13.7*  NEUTROABS 6.5  --  6.7  --   --   --   --   --  7.4  HGB 13.0   < > 12.9   < > 11.5* 11.1* 10.4* 9.9* 8.0*  HCT 41.7   < > 41.0   < > 35.8* 34.6* 32.6* 31.8* 25.7*  MCV 98.3   < > 97.4  --  94.7 94.5 94.5 97.2 98.5  PLT 230   < > 259  --  267 294 300 250 212   < > = values in this interval not displayed.     Basic Metabolic Panel: Recent Labs  Lab 07/14/21 1900 07/15/21 0318 07/18/21 0246 07/18/21 0305 07/18/21 0607 07/19/21 0327 07/20/21 0306  NA 138 141 138 137 137 139 140  K 4.3 4.0 7.4* 3.7 3.9 4.3 3.7  CL 102 103 104 105  --  106 108  CO2 26 27  --  23  --  22 26  GLUCOSE 121* 102* 115* 150*  --  130* 115*  BUN 22 18 34* 19  --  14 15  CREATININE 0.86 0.75 0.80 0.81  --  0.79 0.67  CALCIUM 9.0 9.0  --  8.6*  --  8.4* 8.1*  MG  --   --   --   --   --  1.8  --     GFR: Estimated Creatinine Clearance: 66.8 mL/min (by C-G formula based on SCr of 0.67 mg/dL). Recent Labs  Lab 07/18/21 1543 07/18/21 2121 07/19/21 0327 07/20/21 0306  WBC 18.6* 22.0* 22.0* 13.7*     Liver Function Tests: Recent Labs  Lab 07/14/21 1900  AST 40  ALT 33  ALKPHOS 100  BILITOT 0.4  PROT 6.2*  ALBUMIN 3.2*    Recent Labs  Lab 07/14/21 1900  LIPASE 38    No results for input(s): AMMONIA in the last 168 hours.  ABG    Component Value Date/Time   PHART 7.424 07/18/2021 0607   PCO2ART 41.3 07/18/2021 0607   PO2ART 450 (H) 07/18/2021 0607   HCO3 27.4 07/18/2021 0607   TCO2 29 07/18/2021 0607   ACIDBASEDEF 0.9 02/12/2020 1220   O2SAT  100.0 07/18/2021 0607      Coagulation Profile: Recent Labs  Lab 07/18/21 0612  INR 1.3*     Cardiac Enzymes: No results for input(s): CKTOTAL, CKMB, CKMBINDEX, TROPONINI in the last 168 hours.  HbA1C: Hgb A1c MFr Bld  Date/Time Value Ref Range Status  03/15/2021 04:43 AM 5.9 (H) 4.8 - 5.6 % Final    Comment:    (NOTE) Pre diabetes:          5.7%-6.4%  Diabetes:              >6.4%  Glycemic control for   <7.0% adults with diabetes     CBG: Recent Labs  Lab 07/14/21 1904  GLUCAP 125*  Mitzi Hansen, MD Internal Medicine Resident PGY-3 [name] 07/20/2021 7:16 AM

## 2021-07-20 NOTE — Evaluation (Signed)
Occupational Therapy Re-Evaluation Patient Details Name: Katelyn Lamb MRN: [id number] DOB: 01-25-1946 Today's Date: 07/20/2021   History of Present Illness Pt is a 75 y.o. F who presents 07/14/2021 with working diagnosis of catheter associated UTI - likely EBSL and increasing bilateral lower extremity weakness. Patient with epistaxis. s/p embolization of L IMAX on 11/15. Intubated 11/15-11/16. 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   Clinical Impression   Pt seen after transfer to ICU s/p  embolization of L IMAX and intubated/extubated 1 day. Required +2 Max A with Stedy to mobilize to chair. Total A with LB ADL and Mod A with UB ADL. Pt demonstrates a functional decline and would benefit form post acute rehab. Husband states pt was too weak to manage himself PTA. CIR is appropriate if husband is able to hire caregivers to assist after DC and pt DC home @ wc level. Will continue to follow acutely.      Recommendations for follow up therapy are one component of a multi-disciplinary discharge planning process, led by the attending physician.  Recommendations may be updated based on patient status, additional functional criteria and insurance authorization.   Follow Up Recommendations  Acute inpatient rehab (3hours/day) (pending progress)    Assistance Recommended at Discharge Frequent or constant Supervision/Assistance  Functional Status Assessment     Equipment Recommendations  Hospital bed;Wheelchair cushion (measurements OT);Wheelchair (measurements OT);Other (comment) (hoyer)    Recommendations for Other Services Rehab consult     Precautions / Restrictions Precautions Precautions: Fall Precaution Comments: at risk for skin breakdown; suprapubic catheter Restrictions Weight Bearing Restrictions: No      Mobility Bed Mobility Overal bed mobility: Needs  Assistance Bed Mobility: Rolling;Sidelying to Sit Rolling: Mod assist Sidelying to sit: Mod assist;+2 for physical assistance;+2 for safety/equipment       General bed mobility comments: modA for initiation of rolling towards L side and +2 for trunk elevation and repositioning hips towards EOB    Transfers Overall transfer level: Needs assistance Equipment used: Ambulation equipment used Transfers: Sit to/from Stand Sit to Stand: Max assist;+2 safety/equipment;+2 physical assistance;From elevated surface Stand pivot transfers: Total assist;+2 physical assistance;+2 safety/equipment         General transfer comment: use of STEDY and maxA+2 to stand from EOB and stedy flaps. requires cues for hand placement and hip extension for upright posture. Transfer via Lift Equipment: Stedy    Balance Overall balance assessment: Needs assistance Sitting-balance support: Feet unsupported Sitting balance-Leahy Scale: Poor     Standing balance support: Bilateral upper extremity supported Standing balance-Leahy Scale: Zero                             ADL either performed or assessed with clinical judgement   ADL Overall ADL's : Needs assistance/impaired Eating/Feeding: Minimal assistance   Grooming: Moderate assistance   Upper Body Bathing: Moderate assistance   Lower Body Bathing: Total assistance   Upper Body Dressing : Maximal assistance   Lower Body Dressing: +2 for physical assistance       Toileting- Clothing Manipulation and Hygiene: Total assistance (suprapubic catheter)       Functional mobility during ADLs: Maximal assistance;+2 for physical assistance       Vision         Perception     Praxis      Pertinent Vitals/Pain Pain Assessment: Faces Faces Pain Scale:  Hurts a little bit Pain Location: bottom Pain Descriptors / Indicators: Discomfort Pain Intervention(s): Monitored during session     Hand Dominance     Extremity/Trunk  Assessment Upper Extremity Assessment Upper Extremity Assessment: Generalized weakness RUE Deficits / Details: AROM to @ 90 shoulder FF; other AROM overall WFL; Strength @ 3/5 elbow/wrist/hand; 2+/5 shoulders RUE Coordination: decreased fine motor;decreased gross motor LUE Coordination: decreased fine motor;decreased gross motor   Lower Extremity Assessment Lower Extremity Assessment: Defer to PT evaluation       Communication     Cognition Arousal/Alertness: Awake/alert Behavior During Therapy: Flat affect Overall Cognitive Status: Impaired/Different from baseline Area of Impairment: Attention;Memory;Following commands;Safety/judgement;Awareness;Problem solving                   Current Attention Level: Sustained Memory: Decreased short-term memory Following Commands: Follows one step commands with increased time Safety/Judgement: Decreased awareness of safety;Decreased awareness of deficits Awareness: Emergent Problem Solving: Slow processing;Decreased initiation;Requires verbal cues       General Comments  On face tent, 5L O2 28% FiO2. Removed for mobility with VSS. Donned at end of session for comfort    Exercises Exercises: Other exercises Other Exercises Other Exercises: AROM knee flexion/extension in supine and sitting   Shoulder Instructions      Home Living                                          Prior Functioning/Environment                          OT Problem List:        OT Treatment/Interventions:      OT Goals(Current goals can be found in the care plan section) Acute Rehab OT Goals Patient Stated Goal: to get stronger OT Goal Formulation: With patient/family Time For Goal Achievement: 08/03/21 Potential to Achieve Goals: Fair ADL Goals Pt Will Perform Grooming: with set-up;sitting Pt Will Perform Lower Body Bathing: with min assist;sit to/from stand Pt Will Perform Lower Body Dressing: with min assist;sit  to/from stand Pt Will Transfer to Toilet: with min assist;stand pivot transfer;bedside commode Pt Will Perform Tub/Shower Transfer: with min assist;tub bench;rolling walker  OT Frequency: Min 2X/week   Barriers to D/C:            Co-evaluation PT/OT/SLP Co-Evaluation/Treatment: Yes Reason for Co-Treatment: For patient/therapist safety;To address functional/ADL transfers PT goals addressed during session: Mobility/safety with mobility;Proper use of DME;Strengthening/ROM OT goals addressed during session: ADL's and self-care;Strengthening/ROM      AM-PAC OT "6 Clicks" Daily Activity     Outcome Measure Help from another person eating meals?: A Little Help from another person taking care of personal grooming?: A Lot Help from another person toileting, which includes using toliet, bedpan, or urinal?: Total Help from another person bathing (including washing, rinsing, drying)?: A Lot Help from another person to put on and taking off regular upper body clothing?: A Lot Help from another person to put on and taking off regular lower body clothing?: Total 6 Click Score: 11   End of Session Equipment Utilized During Treatment: Gait belt;Oxygen (face mask) Nurse Communication: Mobility status;Need for lift equipment  Activity Tolerance: Patient tolerated treatment well Patient left: in chair;with call bell/phone within reach;with chair alarm set;with family/visitor present  OT Visit Diagnosis: Unsteadiness on feet (R26.81);Other abnormalities of gait and mobility (R26.89);Muscle weakness (  generalized) (M62.81);Other symptoms and signs involving the nervous system (R29.898);Pain Pain - part of body:  (bottom)                Time: 0100-7121 OT Time Calculation (min): 26 min Charges:  OT General Charges $OT Visit: 1 Visit OT Evaluation $OT Re-eval: 1 Re-eval  Maurie Boettcher, OT/L   Acute OT Clinical Specialist Acute Rehabilitation Services Pager [phone] Office [phone]    [facility] 07/20/2021, 3:37 PM

## 2021-07-20 NOTE — Progress Notes (Signed)
[facility] ADULT ICU REPLACEMENT PROTOCOL   The patient does apply for the [facility] Adult ICU Electrolyte Replacment Protocol based on the criteria listed below:   1.Exclusion criteria: TCTS patients, ECMO patients, and Dialysis patients 2. Is GFR >/= 30 ml/min? Yes.    Patient's GFR today is >60 3. Is SCr </= 2? Yes.   Patient's SCr is 0.67 mg/dL 4. Did SCr increase >/= 0.5 in 24 hours? No. 5.Pt's weight >40kg  Yes.   6. Abnormal electrolyte(s): K+ 3.7  7. Electrolytes replaced per protocol 8.  Call MD STAT for K+ </= 2.5, Phos </= 1, or Mag </= 1 Physician:  n/a  Darlys Gales 07/20/2021 5:04 AM

## 2021-07-20 NOTE — Progress Notes (Signed)
OT Treatment Note  Pt seen for second session to assist with mobility with use of Stedy. Max A +2 to [place] and mobilize to bed. As noted before, husband will need to hire caregivers to assist with care after DC from [place]. If this is not an option, recommend SNF. Will continue to follow acutely.      07/20/21 1541  OT Visit Information  Last OT Received On 07/20/21  Assistance Needed +2  History of Present Illness Pt is a 75 y.o. F who presents 07/14/2021 with working diagnosis of catheter associated UTI - likely EBSL and increasing bilateral lower extremity weakness. Patient with epistaxis. s/p embolization of L IMAX on 11/15. Intubated 11/15-11/16. 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  Precautions  Precautions Fall  Precaution Comments at risk for skin breakdown; suprapubic catheter  Pain Assessment  Pain Assessment Faces  Faces Pain Scale 4  Pain Location bottom (general discomfort)  Pain Descriptors / Indicators Discomfort;Grimacing  Pain Intervention(s) Limited activity within patient's tolerance  Cognition  Arousal/Alertness Awake/alert  Behavior During Therapy Flat affect  Overall Cognitive Status Impaired/Different from baseline  Problem Solving Slow processing;Decreased initiation;Requires verbal cues  Upper Extremity Assessment  Upper Extremity Assessment Generalized weakness (as noted before)  Bed Mobility  Overal bed mobility Needs Assistance  Supine to sit Max assist;+2 for physical assistance  Transfers  Overall transfer level Needs assistance  Equipment used  Charlaine Dalton)  Transfers Sit to/from Stand  Sit to Stand Max assist;+2 physical assistance  Transfer via [employer] transfer comment unable to stand comletely upright  Balance  Sitting balance-Leahy Scale Poor  Standing balance-Leahy Scale Zero  OT - End of Session   Equipment Utilized During Treatment Gait belt  Activity Tolerance Patient tolerated treatment well  Patient left in bed;with call bell/phone within reach;with bed alarm set;with family/visitor present;with restraints reapplied  Nurse Communication Need for lift equipment  OT Assessment/Plan  OT Plan Discharge plan remains appropriate (pending progress)  OT Visit Diagnosis Unsteadiness on feet (R26.81);Other abnormalities of gait and mobility (R26.89);Muscle weakness (generalized) (M62.81);Other symptoms and signs involving the nervous system (R29.898);Pain  Pain - part of body  (bottom)  OT Frequency (ACUTE ONLY) Min 2X/week  Recommendations for Other Services Rehab consult  Follow Up Recommendations Acute inpatient rehab (3hours/day) (pending progress)  Assistance recommended at discharge Frequent or [place] [facility] bed;Wheelchair cushion (measurements OT);Wheelchair (measurements OT);Other (comment)  AM-PAC OT "6 Clicks" Daily Activity Outcome Measure (Version 2)  Help from another person eating meals? 3  Help from another person taking care of personal grooming? 2  Help from another person toileting, which includes using toliet, bedpan, or urinal? 1  Help from another person bathing (including washing, rinsing, drying)? 2  Help from another person to put on and taking off regular upper body clothing? 2  Help from another person to put on and taking off regular lower body clothing? 1  6 Click Score 11  Progressive Mobility  What is the highest level of mobility based on the progressive mobility assessment? Level 2 ([place]) - Balance while sitting on edge of bed and cannot stand  Mobility Sit up in bed/chair position for meals  OT Goal Progression  Progress towards OT goals OT to reassess next treatment  Acute Rehab OT Goals  Patient Stated Goal to get stronger  OT Goal Formulation With patient/family  Time For Goal Achievement 08/03/21  Potential to Achieve Goals Fair  ADL Goals  Pt Will Perform Grooming with set-up;sitting  Pt Will Perform Lower Body Bathing with min assist;sit to/from stand  Pt Will Perform Lower Body Dressing with min assist;sit to/from stand  Pt Will Transfer to Toilet with min assist;stand pivot transfer;bedside commode  Pt Will Perform Tub/Shower Transfer with min assist;tub bench;rolling walker  OT Time Calculation  OT Start Time (ACUTE ONLY) 1225  OT Stop Time (ACUTE ONLY) 1245  OT Time Calculation (min) 20 min  OT General Charges  $OT Visit 1 Visit  OT Treatments  $Self Care/Home Management  8-22 mins  Maurie Boettcher, OT/L   Acute OT Clinical Specialist [place] Pager [phone] Office [phone]

## 2021-07-20 NOTE — Progress Notes (Signed)
Physical Therapy Treatment Patient Details Name: Katelyn Lamb MRN: [id number] DOB: 08/25/1946 Today's Date: 07/20/2021   History of Present Illness Pt is a 75 y.o. F who presents 07/14/2021 with working diagnosis of catheter associated UTI - likely EBSL and increasing bilateral lower extremity weakness. Patient with epistaxis. s/p embolization of L IMAX on 11/15. Intubated 11/15-11/16. 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    PT Comments    Patient was able to progress OOB to chair via Stedy. Patient required maxA+2 for sit to stand transfer from bed and Stedy flaps. Patient requires multimodal cueing for posture throughout session. Husband present and supportive. Patient on face tent with 5L O2 28% FiO2, removed for mobility with VSS. Donned at end of session for comfort. Continue to recommend CIR level therapies to assist with maximizing functional mobility and safety. Will continue to assess.     Recommendations for follow up therapy are one component of a multi-disciplinary discharge planning process, led by the attending physician.  Recommendations may be updated based on patient status, additional functional criteria and insurance authorization.  Follow Up Recommendations  Acute inpatient rehab (3hours/day)     Assistance Recommended at Discharge Frequent or [facility] bed;Other (comment) (hoyer lift)    Recommendations for Other Services       Precautions / Restrictions Precautions Precautions: Fall Restrictions Weight Bearing Restrictions: No     Mobility  Bed Mobility Overal bed mobility: Needs Assistance Bed Mobility: Rolling;Sidelying to Sit Rolling: Mod assist Sidelying to sit: Mod assist;+2 for physical assistance;+2 for safety/equipment       General bed mobility comments: modA  for initiation of rolling towards L side and +2 for trunk elevation and repositioning hips towards EOB    Transfers Overall transfer level: Needs assistance Equipment used: Ambulation equipment used Transfers: Sit to/from Stand Sit to Stand: Max assist;+2 safety/equipment;+2 physical assistance;From elevated surface Stand pivot transfers: Total assist;+2 physical assistance;+2 safety/equipment         General transfer comment: use of STEDY and maxA+2 to stand from EOB and stedy flaps. requires cues for hand placement and hip extension for upright posture. Transfer via Lift Equipment: Stedy  Ambulation/Gait                   Stairs             Wheelchair Mobility    Modified Rankin (Stroke Patients Only)       Balance Overall balance assessment: Needs assistance Sitting-balance support: Feet unsupported Sitting balance-Leahy Scale: Poor     Standing balance support: Bilateral upper extremity supported Standing balance-Leahy Scale: Zero                              Cognition Arousal/Alertness: Awake/alert Behavior During Therapy: Flat affect Overall Cognitive Status: Impaired/Different from baseline Area of Impairment: Attention;Memory;Following commands;Safety/judgement;Awareness;Problem solving                   Current Attention Level: Sustained Memory: Decreased short-term memory Following Commands: Follows one step commands with increased time Safety/Judgement: Decreased awareness of safety;Decreased awareness of deficits Awareness: Emergent Problem Solving: Slow processing;Decreased initiation;Requires verbal cues          Exercises Other Exercises Other Exercises: AROM knee flexion/extension in supine and sitting    General Comments General comments (skin integrity, edema, etc.):  On face tent, 5L O2 28% FiO2. Removed for mobility with VSS. Donned at end of session for comfort      Pertinent Vitals/Pain Pain  Assessment: Faces Faces Pain Scale: No hurt Pain Intervention(s): Monitored during session    Home Living                          Prior Function            PT Goals (current goals can now be found in the care plan section) Acute Rehab PT Goals Patient Stated Goal: pt spouse would like pt to be able to get to a level where he can assist her PT Goal Formulation: With patient/family Time For Goal Achievement: 07/31/21 Potential to Achieve Goals: Good Progress towards PT goals: Progressing toward goals    Frequency    Min 3X/week      PT Plan Current plan remains appropriate    Co-evaluation PT/OT/SLP Co-Evaluation/Treatment: Yes Reason for Co-Treatment: For patient/therapist safety;To address functional/ADL transfers PT goals addressed during session: Mobility/safety with mobility;Proper use of DME;Strengthening/ROM        AM-PAC PT "6 Clicks" Mobility   Outcome Measure  Help needed turning from your back to your side while in a flat bed without using bedrails?: A Lot Help needed moving from lying on your back to sitting on the side of a flat bed without using bedrails?: Total Help needed moving to and from a bed to a chair (including a wheelchair)?: Total Help needed standing up from a chair using your arms (e.g., wheelchair or bedside chair)?: Total Help needed to walk in hospital room?: Total Help needed climbing 3-5 steps with a railing? : Total 6 Click Score: 7    End of Session Equipment Utilized During Treatment: Oxygen;Gait belt Activity Tolerance: Patient tolerated treatment well Patient left: in chair;with call bell/phone within reach;with chair alarm set;with family/visitor present Nurse Communication: Mobility status PT Visit Diagnosis: Unsteadiness on feet (R26.81);Muscle weakness (generalized) (M62.81);History of falling (Z91.81);Difficulty in walking, not elsewhere classified (R26.2)     Time: 1110-1135 PT Time Calculation (min) (ACUTE  ONLY): 25 min  Charges:  $Therapeutic Activity: 8-22 mins                     Anneli Bing A. Gilford Rile PT, DPT Acute Rehabilitation Services Pager [phone] Office [phone]    Linna Hoff 07/20/2021, 1:18 PM

## 2021-07-20 NOTE — Progress Notes (Signed)
Patient ID: Katelyn Lamb, female   DOB: 06/23/1946, 75 y.o.   MRN: [id number]  She is doing well. No significant bleeding but did have some mild yesterday. She thought the packing was coming out today but need to wait few more days

## 2021-07-20 NOTE — Anesthesia Postprocedure Evaluation (Signed)
Anesthesia Post Note  Patient: Katelyn Lamb  Procedure(s) Performed: IR WITH ANESTHESIA     Patient location during evaluation: ICU Anesthesia Type: General Anesthetic complications: no Comments: Post-op note completed by chart review. Pt successfully extubated yesterday, bleeding has subsided. No apparent anesthetic complications.   No notable events documented.  Last Vitals:  Vitals:   07/20/21 1400 07/20/21 1600  BP: 95/65   Pulse: (!) 108   Resp: (!) 22   Temp:  37.5 C  SpO2: 100%     Last Pain:  Vitals:   07/20/21 1600  TempSrc: Axillary  PainSc:                  Jatziry Wechter,E. Ichiro Chesnut

## 2021-07-20 NOTE — Progress Notes (Signed)
Inpatient Rehabilitation Admissions Coordinator   I met with patient and spouse at bedside. I await further progress with therapy before possible beginning Auth with [employer]  approval for CIR. Await further tolerance demonstration and discussions with Spouse.  Danne Baxter, RN, MSN Rehab Admissions Coordinator [phone] 07/20/2021 4:02 PM

## 2021-07-21 ENCOUNTER — Inpatient Hospital Stay: Payer: PPO | Admitting: Internal Medicine

## 2021-07-21 ENCOUNTER — Other Ambulatory Visit (HOSPITAL_COMMUNITY): Payer: Self-pay

## 2021-07-21 DIAGNOSIS — J9601 Acute respiratory failure with hypoxia: Secondary | ICD-10-CM | POA: Diagnosis not present

## 2021-07-21 LAB — CBC
HCT: 23.4 % — ABNORMAL LOW (ref 36.0–46.0)
HCT: 29.6 % — ABNORMAL LOW (ref 36.0–46.0)
Hemoglobin: 7.2 g/dL — ABNORMAL LOW (ref 12.0–15.0)
Hemoglobin: 9.8 g/dL — ABNORMAL LOW (ref 12.0–15.0)
MCH: 30.8 pg (ref 26.0–34.0)
MCH: 31.6 pg (ref 26.0–34.0)
MCHC: 30.8 g/dL (ref 30.0–36.0)
MCHC: 33.1 g/dL (ref 30.0–36.0)
MCV: 100 fL (ref 80.0–100.0)
MCV: 95.5 fL (ref 80.0–100.0)
Platelets: 188 10*3/uL (ref 150–400)
Platelets: 199 10*3/uL (ref 150–400)
RBC: 2.34 MIL/uL — ABNORMAL LOW (ref 3.87–5.11)
RBC: 3.1 MIL/uL — ABNORMAL LOW (ref 3.87–5.11)
RDW: 16 % — ABNORMAL HIGH (ref 11.5–15.5)
RDW: 17.3 % — ABNORMAL HIGH (ref 11.5–15.5)
WBC: 10.6 10*3/uL — ABNORMAL HIGH (ref 4.0–10.5)
WBC: 11.7 10*3/uL — ABNORMAL HIGH (ref 4.0–10.5)
nRBC: 0.4 % — ABNORMAL HIGH (ref 0.0–0.2)
nRBC: 0.5 % — ABNORMAL HIGH (ref 0.0–0.2)

## 2021-07-21 LAB — BASIC METABOLIC PANEL
Anion gap: 4 — ABNORMAL LOW (ref 5–15)
BUN: 8 mg/dL (ref 8–23)
CO2: 26 mmol/L (ref 22–32)
Calcium: 8 mg/dL — ABNORMAL LOW (ref 8.9–10.3)
Chloride: 108 mmol/L (ref 98–111)
Creatinine, Ser: 0.68 mg/dL (ref 0.44–1.00)
GFR, Estimated: >60 mL/min (ref >60)
Glucose, Bld: 109 mg/dL — ABNORMAL HIGH (ref 70–99)
Potassium: 3.5 mmol/L (ref 3.5–5.1)
Sodium: 138 mmol/L (ref 135–145)

## 2021-07-21 LAB — PREPARE RBC (CROSSMATCH)

## 2021-07-21 LAB — MAGNESIUM: Magnesium: 1.7 mg/dL (ref 1.7–2.4)

## 2021-07-21 MED ORDER — POLYETHYLENE GLYCOL 3350 17 G PO PACK
17.0000 g | PACK | Freq: Every day | ORAL | Status: DC
Start: 2021-07-21 — End: 2021-08-03
  Administered 2021-07-22 – 2021-08-02 (×8): 17 g via ORAL
  Filled 2021-07-21 (×12): qty 1

## 2021-07-21 MED ORDER — SENNOSIDES-DOCUSATE SODIUM 8.6-50 MG PO TABS
2.0000 | ORAL_TABLET | Freq: Every day | ORAL | Status: DC
Start: 2021-07-21 — End: 2021-08-03
  Administered 2021-07-21 – 2021-07-23 (×3): 2 via ORAL
  Filled 2021-07-21 (×5): qty 2

## 2021-07-21 MED ORDER — POTASSIUM CHLORIDE CRYS ER 20 MEQ PO TBCR
40.0000 meq | EXTENDED_RELEASE_TABLET | Freq: Once | ORAL | Status: AC
  Administered 2021-07-21: 40 meq via ORAL
  Filled 2021-07-21: qty 2

## 2021-07-21 MED ORDER — ACYCLOVIR 400 MG PO TABS
400.0000 mg | ORAL_TABLET | Freq: Two times a day (BID) | ORAL | Status: DC
  Administered 2021-07-21 – 2021-08-02 (×26): 400 mg via ORAL
  Filled 2021-07-21 (×27): qty 1

## 2021-07-21 MED ORDER — SODIUM CHLORIDE 0.9% IV SOLUTION: Freq: Once | INTRAVENOUS | Status: AC

## 2021-07-21 MED ORDER — MAGNESIUM SULFATE 2 GM/50ML IV SOLN
2.0000 g | Freq: Once | INTRAVENOUS | Status: AC
  Administered 2021-07-21: 2 g via INTRAVENOUS
  Filled 2021-07-21: qty 50

## 2021-07-21 MED ORDER — IPRATROPIUM-ALBUTEROL 0.5-2.5 (3) MG/3ML IN SOLN
3.0000 mL | RESPIRATORY_TRACT | Status: DC | PRN
  Administered 2021-07-21: 3 mL via RESPIRATORY_TRACT
  Filled 2021-07-21: qty 3

## 2021-07-21 NOTE — Progress Notes (Signed)
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  Admitted for UTI and started on meropenem.   ID thought likely colonization, abtx held. 11/12  Developed bleeding from L nare this evening at 1030.  Rhinorocket initially placed with only partial resolution of bleeding.  ENT placed epstat catheter, still bleeding thought significantly slowed, but Patient intubated for IR procedure for embolization of L sphenopalatine artery .  Will remain intubated for airway protection, as  she sill has significant bleeding.      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 Seen by ENT yesterday evening who decided to keep packing in place for total of 5d.   Objective   Blood pressure 102/68, pulse (!) 110, temperature 99 F (37.2 C), temperature source Oral, resp. rate (!) 23, height 5\' 7"  (1.702 m), weight 76.7 kg, SpO2 94 %.    FiO2 (%):  [28 %] 28 %   Examination:  General: Chronically ill-appearing female in no acute distress HEENT: Nasal packing in the left nare.   Cardiac: Tachycardic rate, regular rhythm no lower extremity edema, extremities are warm Pulm: Breathing comfortably on facemask, expiratory wheezing bilaterally Resolved Hospital Problem list      Assessment & Plan:  Epistaxis left nare: S/p IR embolization of left sphenopalatine artery Blood loss anemia In the setting of xarelto use; s/p kcentra 11/15 Hemoglobin 7.2 this morning. Suspect this is still related to the slow  -appreciate ENT management. Seen yesterday and notes plans to keep packing in place for total of 5d, packing placed 11/15 -transfuse 1U PRBC with 2 hour post transfusion h/h -trend hemoglobin twice daily -will plan to transfer out of the unit later today  Acute hypoxic respiratory failure  Intubated 11/15-11/16 for airway protection in setting of epistaxis. Stable now on 5L via face mask. Noted to have some wheezing on exam this morning -prn duonebs -pulm hygeine   Transverse myelitis Appears that she had been becoming weaker in the months leading up to admission, multiple telephone encounters between her husband and neurology. Recent CTH/MRI in August had not revealed any acute changes. -Continue chronic prednisone -PT/OT following  Neurogenic bladder secondary to above Urine leakage resolved Continue Myrbetric    Constipation resolved Daily senakot and miralax  Chronic pain syndrome: -Continue Lyrica, Keppra   History of DVT -Xarelto on hold  Best Practice (right click and "Reselect all SmartList Selections" daily)   Diet/type: ADAT DVT prophylaxis: other GI prophylaxis: N/A Lines: N/A Foley:  N/A Code Status:  full code Last date of multidisciplinary goals of care discussion [pending] Family: Husband updated at bedside 11/17  Labs   CBC: Recent Labs  Lab 07/14/21 1900 07/15/21 0318 07/18/21 0305 07/18/21 0607 07/18/21 1543 07/18/21 2121 07/19/21 0327 07/20/21 0306 07/20/21 1900  WBC 9.9   < > 12.7*   < > 18.6* 22.0* 22.0* 13.7* 14.1*  NEUTROABS 6.5  --  6.7  --   --   --   --  7.4  --   HGB 13.0   < > 12.9   < > 11.1* 10.4* 9.9* 8.0* 7.8*  HCT 41.7   < > 41.0   < > 34.6* 32.6* 31.8* 25.7* 25.7*  MCV 98.3   < > 97.4   < >  94.5 94.5 97.2 98.5 100.8*  PLT 230   < > 259   < > 294 300 250 212 210   < > = values in this interval not displayed.     Basic Metabolic Panel: Recent Labs  Lab 07/14/21 1900 07/15/21 0318 07/18/21 0246 07/18/21 0305 07/18/21 0607 07/19/21 0327 07/20/21 0306  NA 138 141 138 137 137 139 140  K 4.3 4.0 7.4* 3.7 3.9 4.3 3.7  CL 102 103 104 105  --  106 108  CO2 26 27  --  23  --  22 26  GLUCOSE 121* 102* 115* 150*  --  130* 115*  BUN 22 18 34* 19  --  14 15  CREATININE 0.86 0.75 0.80 0.81  --  0.79 0.67  CALCIUM 9.0 9.0  --  8.6*  --  8.4* 8.1*  MG  --   --   --   --   --  1.8  --     GFR: Estimated Creatinine Clearance: 66.8 mL/min (by C-G formula based on SCr of 0.67 mg/dL). Recent Labs  Lab 07/18/21 2121 07/19/21 0327 07/20/21 0306 07/20/21 1900  WBC 22.0* 22.0* 13.7* 14.1*     Liver Function Tests: Recent Labs  Lab 07/14/21 1900  AST 40  ALT 33  ALKPHOS 100  BILITOT 0.4  PROT 6.2*  ALBUMIN 3.2*    Recent Labs  Lab 07/14/21 1900  LIPASE 38    No results for input(s): AMMONIA in the last 168 hours.  ABG    Component Value Date/Time   PHART 7.424 07/18/2021 0607   PCO2ART 41.3 07/18/2021 0607   PO2ART 450 (H) 07/18/2021 0607   HCO3 27.4 07/18/2021 0607   TCO2 29 07/18/2021 0607   ACIDBASEDEF 0.9 02/12/2020 1220   O2SAT 100.0 07/18/2021 0607      Coagulation Profile: Recent Labs  Lab 07/18/21 0612  INR 1.3*     Cardiac Enzymes: No results for input(s): CKTOTAL, CKMB, CKMBINDEX, TROPONINI in the last 168 hours.  HbA1C: Hgb A1c MFr Bld  Date/Time Value Ref Range Status  03/15/2021 04:43 AM 5.9 (H) 4.8 - 5.6 % Final    Comment:    (NOTE) Pre diabetes:          5.7%-6.4%  Diabetes:              >6.4%  Glycemic control for   <7.0% adults with diabetes     CBG: Recent Labs  Lab 07/14/21 [place], MD Internal Medicine Resident PGY-3 [name] 07/21/2021 7:40  AM

## 2021-07-21 NOTE — Progress Notes (Addendum)
Inpatient Rehabilitation Admissions Coordinator   I met with patient and her spouse at bedside. I discussed that she is not at a level to pursue CIR level therapies and that other rehab venues ned to be considered such as SNF. I will alert TOC and sign off.   , RN, MSN Rehab Admissions Coordinator [phone] 07/21/2021 12:06 PM  

## 2021-07-21 NOTE — TOC Progression Note (Signed)
Transition of Care [facility]) - Progression Note    Patient Details  Name: [name] MRN: [id number] Date of Birth: 1945-09-18  Transition [facility] CM/SW Contact  Ella Bodo, RN Phone Number: 07/21/2021, 3:54 PM  Clinical Narrative:    Patient is not a candidate for inpatient rehab, as unable to tolerate therapy requirements.  Patient/spouse agreeable to skilled nursing facility placement; spoke with patient's husband, and he agrees for patient information to be faxed out.  He states that patient has been to [facility] in the past, and this would be their first choice.  FL-2 completed and faxed out accordingly; will follow up with bed offers as available.   Expected Discharge Plan: [place] Barriers to Discharge: Continued Medical Work up  Expected Discharge Plan and Services Expected Discharge Plan: [place]   Discharge Planning Services: CM Consult Post Acute Care Choice: [place] Living arrangements for the past 2 months: Single Family Home                                       Social Determinants of Health (SDOH) Interventions    Readmission Risk Interventions No flowsheet data found.  Reinaldo Raddle, RN, BSN  Trauma/Neuro ICU Case Manager [phone]

## 2021-07-21 NOTE — TOC Benefit Eligibility Note (Signed)
Patient Teacher, English as a foreign language completed.    The patient is currently admitted and upon discharge could be taking Eliquis 5 mg.  The current 30 day co-pay is, $128.97 due to being in Coverage Gap (donut hole).   The patient is currently admitted and upon discharge could be taking Xarelto 20 mg.  The current 30 day co-pay is, $125.95 due to being in Coverage Gap (donut hole).   The patient is insured through [place], [place] Patient Advocate Specialist [place] Patient Advocate Team Direct Number: [phone]  Fax: [phone]

## 2021-07-21 NOTE — Progress Notes (Signed)
Patient with no bleeding today so will plan on removing packing tomorrow if the no bleeding trend continues.

## 2021-07-21 NOTE — NC FL2 (Signed)
[place] LEVEL OF CARE SCREENING TOOL     IDENTIFICATION  Patient Name: Katelyn Lamb Birthdate: 1946-01-07 Sex: female Admission Date (Current Location): 07/14/2021  [facility] and [place] Number:  Herbalist and Address:  [facility] [place] [place] [address], [place], [place] [address]      Provider Number: [id number]  Attending Physician Name and Address:  Collene Gobble, MD  Relative Name and Phone Number:  Josalynn Johndrow, husband  [phone]    Current Level of Care: Hospital Recommended Level of Care: [place] Prior Approval Number:    Date Approved/Denied:   PASRR Number: [id number]  Discharge Plan: SNF    Current Diagnoses: Patient Active Problem List   Diagnosis Date Noted   Platelet inhibition due to Plavix    Epistaxis s/p embolization 07-18-2021 07/17/2021   Asymptomatic bacteriuria    History of ESBL E. coli infection 07/14/2021   History of ESBL Klebsiella pneumoniae infection 07/14/2021   Generalized weakness 03/15/2021   Acute cystitis with hematuria 03/15/2021   Encephalopathy 03/15/2021   Recurrent falls 03/15/2021   Right pulmonary embolus ([place]) 02/12/2020   Lower extremity weakness 02/02/2020   Macrocytic anemia 02/02/2020   Spinal stenosis 02/02/2020   History of recurrent UTI (urinary tract infection)    Bacterial infection due to Klebsiella pneumoniae    Laceration of left hand 12/09/2019   DNR (do not resuscitate) 12/09/2019   Pelvic fracture ([place]) 12/06/2019   AKI (acute kidney injury) ([place]) 12/06/2019   Urinary tract infection associated with catheterization of urinary tract, initial encounter ([place]) 07/23/2019   Hematuria 07/23/2019   Acute respiratory failure with hypoxia ([place]) 07/23/2019   Hypokalemia 07/23/2019   Acute on chronic anemia 07/23/2019   Fever 07/23/2019   Leukocytosis 07/23/2019   Sepsis ([place]) 07/23/2019   Generalized anxiety disorder 06/17/2019   Menopausal  sweats 06/17/2019   Memory difficulty 03/02/2019   Degenerative spondylolisthesis 09/02/2018   Delirium 03/26/2018   Degeneration of lumbar intervertebral disc 11/08/2017   Lumbar radiculopathy 11/06/2017   Chronic neck pain 09/12/2017   Chronic low back pain 09/06/2017   Chronic pain syndrome 09/06/2017   Dilated pancreatic duct 01/24/2017   DVT, lower extremity, distal, chronic ([place]) 01/22/2017   Elevated serum GGT level 01/22/2017   Medication monitoring encounter 01/08/2017   Neurologic gait dysfunction 11/14/2016   Hypotension due to drugs    Urinary retention    Anxiety about health    Reactive depression    Ataxia    Abdominal spasms    Constipation due to pain medication    Acute lower UTI    Dysuria    Acute deep vein thrombosis (DVT) of popliteal vein of left lower extremity (HCC)    Incomplete paraplegia (HCC)    Acute blood loss anemia    Neurogenic bladder    Neuropathic pain    Muscle spasm    Gastroesophageal reflux disease    Slow transit constipation    Thrombocytopenia (HCC) 10/03/2016   Abnormal MRI, spinal cord    Encephalomyelitis    Numbness    Intractable back pain 09/20/2016   Numbness of left lower extremity 09/20/2016   Hyponatremia 09/20/2016   Herpes zoster without complication 27/78/2423   Spondylosis of cervical region without myelopathy or radiculopathy 06/16/2015    Orientation RESPIRATION BLADDER Height & Weight     Self, Time, Situation, Place  Normal Indwelling catheter (suprapubic catheter) Weight: 76.7 kg Height:  5\' 7"  (170.2 cm)  BEHAVIORAL SYMPTOMS/MOOD NEUROLOGICAL BOWEL NUTRITION STATUS      Continent Diet (Dysphagia 3 diet)  AMBULATORY STATUS COMMUNICATION OF NEEDS Skin   Extensive Assist Verbally Normal                       Personal Care Assistance Level of Assistance  Bathing, Feeding, Dressing Bathing Assistance: Maximum assistance Feeding assistance: Limited assistance Dressing Assistance: Maximum  assistance     Functional Limitations Info             SPECIAL CARE FACTORS FREQUENCY  PT (By licensed PT), OT (By licensed OT)     PT Frequency: 5x weekly OT Frequency: 5x weekly            Contractures Contractures Info: Not present    Additional Factors Info  Code Status, Allergies Code Status Info: Full code Allergies Info: Demerol-hallucinations; Percocet-itching; Amoxicillin-pot Clavulanate-diarrhea, severe pain, HA; Penicillins-itching, rash           Current Medications (07/21/2021):  This is the current hospital active medication list Current Facility-Administered Medications  Medication Dose Route Frequency Provider Last Rate Last Admin   0.9 %  sodium chloride infusion (Manually program via Guardrails IV Fluids)   Intravenous Once Kristopher Oppenheim, DO       0.9 %  sodium chloride infusion   Intravenous Continuous Darrick Meigs, Rylee, MD   Stopped at 07/21/21 1009   acetaminophen (TYLENOL) tablet 650 mg  650 mg Oral Q6H PRN Etta Quill, DO       Or   acetaminophen (TYLENOL) suppository 650 mg  650 mg Rectal Q6H PRN Etta Quill, DO       acyclovir (ZOVIRAX) tablet 400 mg  400 mg Oral BID Collene Gobble, MD   400 mg at 07/21/21 1113   Chlorhexidine Gluconate Cloth 2 % PADS 6 each  6 each Topical Q0600 Shawna Clamp, MD   6 each at 07/21/21 1100   diazepam (VALIUM) tablet 5 mg  5 mg Oral BID Christian, Rylee, MD   5 mg at 07/21/21 1112   DULoxetine (CYMBALTA) DR capsule 30 mg  30 mg Oral q AM Darrick Meigs, Rylee, MD   30 mg at 07/21/21 9357   DULoxetine (CYMBALTA) DR capsule 60 mg  60 mg Oral QHS Christian, Rylee, MD   60 mg at 07/20/21 2201   HYDROcodone-acetaminophen (NORCO) 10-325 MG per tablet 1 tablet  1 tablet Oral Q6H PRN Etta Quill, DO   1 tablet at 07/19/21 2155   ipratropium-albuterol (DUONEB) 0.5-2.5 (3) MG/3ML nebulizer solution 3 mL  3 mL Nebulization Q4H PRN Darrick Meigs, Rylee, MD   3 mL at 07/21/21 0815   levETIRAcetam (KEPPRA) tablet 250 mg   250 mg Oral BID Christian, Rylee, MD   250 mg at 07/21/21 1112   magnesium sulfate IVPB 2 g 50 mL  2 g Intravenous Once Collene Gobble, MD 50 mL/hr at 07/21/21 1458 2 g at 07/21/21 1458   mirabegron ER (MYRBETRIQ) tablet 50 mg  50 mg Oral QHS Christian, Rylee, MD   50 mg at 07/20/21 2202   ondansetron (ZOFRAN) tablet 4 mg  4 mg Oral Q6H PRN Etta Quill, DO       Or   ondansetron [facility]) injection 4 mg  4 mg Intravenous Q6H PRN Etta Quill, DO       phenol (CHLORASEPTIC) mouth spray 1 spray  1 spray Mouth/Throat PRN Collene Gobble, MD   1 spray at 07/21/21 [phone]  polyethylene glycol (MIRALAX / GLYCOLAX) packet 17 g  17 g Oral Daily Christian, Rylee, MD       predniSONE (DELTASONE) tablet 5 mg  5 mg Oral Q breakfast Collene Gobble, MD   5 mg at 07/21/21 0831   pregabalin (LYRICA) capsule 100 mg  100 mg Oral QHS Christian, Rylee, MD   100 mg at 07/20/21 2201   QUEtiapine (SEROQUEL) tablet 100 mg  100 mg Oral QHS Christian, Rylee, MD   100 mg at 07/20/21 2201   senna-docusate (Senokot-S) tablet 2 tablet  2 tablet Oral QHS Mitzi Hansen, MD         Discharge Medications: Please see discharge summary for a list of discharge medications.  Relevant Imaging Results:  Relevant Lab Results:   Additional Information SS # [id number]  Reinaldo Raddle, RN, BSN  Trauma/Neuro ICU Case Manager [phone]

## 2021-07-22 ENCOUNTER — Inpatient Hospital Stay (HOSPITAL_COMMUNITY): Payer: PPO

## 2021-07-22 LAB — BPAM RBC
Blood Product Expiration Date: 202211232359
ISSUE DATE / TIME: 202211180955
Unit Type and Rh: 5100

## 2021-07-22 LAB — TYPE AND SCREEN
ABO/RH(D): O POS
Antibody Screen: NEGATIVE
Unit division: 0

## 2021-07-22 NOTE — Significant Event (Signed)
Rapid Response Event Note   Reason for Call :  Altered mental status  Initial Focused Assessment:  I was called by the RN because she was having trouble waking the patient up. She reports that the patient has been more sleepy today than normal. On arrival, the patient is awake, which is a change from how she was previous to my arrival. Her husband is bedside and states that this is a change from her normal mental status. On my exam she appears to not be in any distress. She was confused about where she was and the current year. She followed commands for me and correctly told me that my watch tells time. She was ataxic upper extremity bilaterally on my exam   She has history of encephalomyelitis and transverse myelitis due to HSV. Currently, she is taking acyclovir 400 mg. The RN did state that she had a fever of 102 this morning. It was suspected that the patient had a UTI on admission, but UA findings showed otherwise.      Interventions:   Plan of Care:  Continue to monitor this patient's mental status. Gentle reminders of where she is. Try to avoid giving narcotics or other medication that can cause change in mental status. This patient may need head CT    Event Summary:     Call Time: 8453 Arrival Time: 1215 End Time:  Venetia Maxon, RN

## 2021-07-22 NOTE — Progress Notes (Signed)
Per RN, nasal packing was removed by ENT today, requested trial on [place].  Pt placed on 2 lpm [place] w/ humidification, sat 93-94%.  Discussed w/ RN that if  is causing dryness or if bleeding noted, to contact RT for pt to be placed back on AFT.  Pt appears to be tol well currently.

## 2021-07-22 NOTE — TOC Progression Note (Signed)
Transition of Care [facility]) - Progression Note    Patient Details  Name: KEYANAH KOZICKI MRN: [id number] Date of Birth: Jan 22, 1946  Transition of Care [facility]) CM/SW [place], [place] Phone Number: 07/22/2021, 9:25 AM  Clinical Narrative:    CSW following for SNF placement and referral. CSW notes at time of note submission no SNF bed offers have been made. CSW will continue to follow at this time.   Expected Discharge Plan: [place] Barriers to Discharge: Continued Medical Work up  Expected Discharge Plan and Services Expected Discharge Plan: [place]   Discharge Planning Services: CM Consult Post Acute Care Choice: [place] Living arrangements for the past 2 months: Single Family Home                                       Social Determinants of Health (SDOH) Interventions    Readmission Risk Interventions No flowsheet data found.

## 2021-07-22 NOTE — Progress Notes (Signed)
Patient ID: Katelyn Lamb, female   DOB: 1946/05/08, 75 y.o.   MRN: [id number] She has had no active bleeding.  The drainage is a serous slightly red-tinged.  At this point it seemed appropriate to remove the epistatic balloon.  It was removed after removing the water out of both ports without difficulty.  She had no bleeding.  Her vestibule and nasal ala looks normal.  She can use saline spray twice a day and call if there is any further bleeding.

## 2021-07-22 NOTE — Progress Notes (Signed)
[facility] PROGRESS NOTE    Progress Note  CINA KLUMPP  EXH:[id number] DOB: October 02, 1945 DOA: 07/14/2021 PCP: [name], MD     Brief Narrative:   GAVYN ZOSS is an 75 y.o. 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  Unfortunately she started developing bleeding from her left nare Rhino Rocket was placed that was unsuccessful, ENT was consulted to control the bleeding which initially was unsuccessful patient had to be intubated for interventional procedure with embolization of the left sphenopalatine artery on 07/18/2021 successfully extubated on 07/19/2021.     Assessment/Plan:   Acute Epics taxis of the left nare status post IR embolization of the left sphenopalatine artery/acute blood loss anemia: In the setting of Xarelto She was given Kcentra on 07/18/2021 intubated and subsequently extubated on the 16th. She status post 1 unit of packed red blood cells Hemoglobin this morning is 9.8. ENT is on board and recommended to continue packing for several additional days. When to restart anticoagulation per ENT.  Acute respiratory failure with hypoxia: Intubating for airway protection now on 5 L nasal cannula, currently on inhalers. Try to wean to room air.  New onset of fevers: Temperature on 07/22/2021, check a CBC with differential tomorrow morning.  Her saturations have remained stable. This fever could be due to her recent embolization.  Transfer myelitis: Physical therapy was consulted who recommended inpatient rehab. In patient rehab coordinator evaluated the patient deemed her a candidate not for inpatient rehabilitation. She will need  skilled nursing facility.  Neurogenic bladder secondary to above: Continue Myrbetriq.   Patient: Continue daily Senokot and MiraLAX.  Chronic pain: Continue Lyrica and Keppra.  History of DVT: Continue to hold Xarelto.  Right heel unstageable pressure ulcer RN Pressure Injury Documentation: Pressure Injury 02/12/20 Heel Right Deep Tissue Pressure Injury - Purple or maroon localized area of discolored intact skin or blood-filled blister due to damage of underlying soft tissue from pressure and/or shear. (Active)  02/12/20 0945  Location: Heel  Location Orientation: Right  Staging: Deep Tissue Pressure Injury - Purple or maroon localized area of discolored intact skin or blood-filled blister due to damage of underlying soft tissue from pressure and/or shear.  Wound Description (Comments):   Present on Admission: Yes     DVT prophylaxis: SCD Family Communication:none Status is: Inpatient  Remains inpatient appropriate because: Acute epistaxis        Code Status:     Code Status Orders  (From admission, onward)           Start     Ordered   07/18/21 0551  Full code  Continuous        07/18/21 0554           Code Status History     Date Active Date Inactive Code Status Order ID Comments User Context   07/14/2021 2257 07/18/2021 0554 Full Code [id number]  Etta Quill, DO ED   03/15/2021 1317 03/17/2021 2146 Full Code [id number]  Tacey Ruiz, MD Inpatient   02/12/2020 0803 02/17/2020 2330 Full Code [id number]  Allie Bossier, MD ED   02/02/2020 0921 02/06/2020 2304 Full Code [id number]  Norval Morton, MD Inpatient   01/23/2020 2147 01/24/2020  2318 Full Code [id number]  Karie Kirks, DO Inpatient   01/23/2020 2003 01/23/2020 2147 Full Code [id number]  Vianne Bulls, MD Inpatient   01/20/2020 1350 01/23/2020 2002 DNR [id number]  Lequita Halt, MD ED   12/06/2019 1949 12/18/2019 1640 DNR [id number]  Desiree Hane, MD Inpatient   07/23/2019 0357 07/28/2019 1900 Full  Code [id number]  Shela Leff, MD ED   03/26/2018 2237 03/29/2018 1745 Full Code [id number]  Etta Quill, DO ED   10/08/2016 1801 10/26/2016 1548 Full Code [id number]  Cathlyn Parsons, PA-C Inpatient   10/08/2016 1801 10/08/2016 1801 Full Code [id number]  Cathlyn Parsons, PA-C Inpatient   10/03/2016 0251 10/08/2016 1800 Full Code [id number]  Norval Morton, MD ED   09/20/2016 2106 09/26/2016 2030 Full Code [id number]  Reubin Milan, MD Inpatient         IV Access:   Peripheral IV   Procedures and diagnostic studies:   No results found.   Medical Consultants:   None.   Subjective:    Rhapsody I Luten no complaints this morning saturating well.  Objective:    Vitals:   07/22/21 0324 07/22/21 0429 07/22/21 0500 07/22/21 0724  BP: 112/85 112/85  119/79  Pulse: (!) 115 (!) 115  (!) 115  Resp: 19 (!) 25  (!) 23  Temp: 98 F (36.7 C)   (!) 101.5 F (38.6 C)  TempSrc: Axillary   Axillary  SpO2: 94% 92%  92%  Weight:   79.7 kg   Height:       SpO2: 92 % O2 Flow Rate (L/min): 5 L/min FiO2 (%): 28 %   Intake/Output Summary (Last 24 hours) at 07/22/2021 0753 Last data filed at 07/22/2021 0300 Gross per 24 hour  Intake 2225.67 ml  Output 950 ml  Net 1275.67 ml   Filed Weights   07/19/21 0421 07/20/21 0500 07/22/21 0500  Weight: 76.2 kg 76.7 kg 79.7 kg    Exam: General exam: In no acute distress. Respiratory system: Good air movement and clear to auscultation. Cardiovascular system: S1 & S2 heard, RRR. No JVD. Gastrointestinal system: Abdomen is nondistended, soft and nontender.  Extremities: No pedal edema. Skin: No rashes, lesions or ulcers Psychiatry: Judgement and insight appear normal. Mood & affect appropriate.    Data Reviewed:    Labs: Basic Metabolic Panel: Recent Labs  Lab 07/18/21 0246 07/18/21 0305 07/18/21 0607 07/19/21 0327 07/20/21 0306 07/21/21 0723  NA 138 137 137 139 140 138  K 7.4* 3.7 3.9 4.3 3.7 3.5  CL 104 105  --  106  108 108  CO2  --  23  --  22 26 26   GLUCOSE 115* 150*  --  130* 115* 109*  BUN 34* 19  --  14 15 8   CREATININE 0.80 0.81  --  0.79 0.67 0.68  CALCIUM  --  8.6*  --  8.4* 8.1* 8.0*  MG  --   --   --  1.8  --  1.7   GFR Estimated Creatinine Clearance: 68 mL/min (by C-G formula based on SCr of 0.68 mg/dL). Liver Function Tests: No results for input(s): AST, ALT, ALKPHOS, BILITOT, PROT, ALBUMIN in the last 168 hours. No results for input(s): LIPASE, AMYLASE in the last 168 hours. No results for input(s): AMMONIA in the last 168 hours. Coagulation profile Recent Labs  Lab 07/18/21 0612  INR 1.3*   COVID-19 Labs  No results for input(s): DDIMER, FERRITIN, LDH, CRP in the last 72 hours.  Lab Results  Component Value Date   SARSCOV2NAA NEGATIVE 07/14/2021   [place] NEGATIVE 03/15/2021   [place] NEGATIVE 02/12/2020   [place] NEGATIVE 02/06/2020    CBC: Recent Labs  Lab 07/18/21 0305 07/18/21 0607 07/19/21 0327 07/20/21 0306 07/20/21 1900 07/21/21 0723 07/21/21 1801  WBC 12.7*   < > 22.0* 13.7* 14.1* 10.6* 11.7*  NEUTROABS 6.7  --   --  7.4  --   --   --   HGB 12.9   < > 9.9* 8.0* 7.8* 7.2* 9.8*  HCT 41.0   < > 31.8* 25.7* 25.7* 23.4* 29.6*  MCV 97.4   < > 97.2 98.5 100.8* 100.0 95.5  PLT 259   < > 250 212 210 188 199   < > = values in this interval not displayed.   Cardiac Enzymes: No results for input(s): CKTOTAL, CKMB, CKMBINDEX, TROPONINI in the last 168 hours. BNP (last 3 results) No results for input(s): PROBNP in the last 8760 hours. CBG: No results for input(s): GLUCAP in the last 168 hours. D-Dimer: No results for input(s): DDIMER in the last 72 hours. Hgb A1c: No results for input(s): HGBA1C in the last 72 hours. Lipid Profile: No results for input(s): CHOL, HDL, LDLCALC, TRIG, CHOLHDL, LDLDIRECT in the last 72 hours. Thyroid function studies: No results for input(s): TSH, T4TOTAL, T3FREE, THYROIDAB in the last 72 hours.  Invalid input(s):  FREET3 Anemia work up: No results for input(s): VITAMINB12, FOLATE, FERRITIN, TIBC, IRON, RETICCTPCT in the last 72 hours. Sepsis Labs: Recent Labs  Lab 07/20/21 0306 07/20/21 1900 07/21/21 0723 07/21/21 1801  WBC 13.7* 14.1* 10.6* 11.7*   Microbiology Recent Results (from the past 240 hour(s))  Urine Culture     Status: Abnormal   Collection Time: 07/14/21  7:01 PM   Specimen: Urine, Catheterized  Result Value Ref Range Status   Specimen Description URINE, CATHETERIZED  Final   Special Requests   Final    NONE Performed at [facility], [address] [place], [place] [address]    Culture MULTIPLE SPECIES PRESENT, SUGGEST RECOLLECTION (A)  Final   Report Status 07/16/2021 FINAL  Final  Resp Panel by RT-PCR (Flu A&B, Covid) Nasopharyngeal Swab     Status: None   Collection Time: 07/14/21 11:30 PM   Specimen: Nasopharyngeal Swab; Nasopharyngeal(NP) swabs in vial transport medium  Result Value Ref Range Status   SARS Coronavirus 2 by RT PCR NEGATIVE NEGATIVE Final    Comment: (NOTE) SARS-CoV-2 target nucleic acids are NOT DETECTED.  The SARS-CoV-2 RNA is generally detectable in upper respiratory specimens during the acute phase of infection. The lowest concentration of SARS-CoV-2 viral copies this assay can detect is 138 copies/mL. A negative result does not preclude SARS-Cov-2 infection and should not be used as the sole basis for treatment or other patient management decisions. A negative result may occur with  improper specimen collection/handling, submission of specimen other than nasopharyngeal swab, presence of viral mutation(s) within the areas targeted by this assay, and inadequate number of viral copies(<138 copies/mL). A negative result must be combined with clinical observations, patient history, and epidemiological information. The expected result is Negative.  Fact Sheet for Patients:  EntrepreneurPulse.com.au  Fact Sheet for  Healthcare Providers:  IncredibleEmployment.be  This test is no t yet approved or cleared by the [place] FDA and  has been authorized for detection and/or diagnosis of SARS-CoV-2 by FDA under an Emergency Use Authorization (EUA). This EUA will remain  in effect (meaning this  test can be used) for the duration of the COVID-19 declaration under Section 564(b)(1) of the Act, 21 U.S.C.section 360bbb-3(b)(1), unless the authorization is terminated  or revoked sooner.       Influenza A by PCR NEGATIVE NEGATIVE Final   Influenza B by PCR NEGATIVE NEGATIVE Final    Comment: (NOTE) The Xpert Xpress SARS-CoV-2/FLU/RSV plus assay is intended as an aid in the diagnosis of influenza from Nasopharyngeal swab specimens and should not be used as a sole basis for treatment. Nasal washings and aspirates are unacceptable for Xpert Xpress SARS-CoV-2/FLU/RSV testing.  Fact Sheet for Patients: EntrepreneurPulse.com.au  Fact Sheet for Healthcare Providers: IncredibleEmployment.be  This test is not yet approved or cleared by the [place] FDA and has been authorized for detection and/or diagnosis of SARS-CoV-2 by FDA under an Emergency Use Authorization (EUA). This EUA will remain in effect (meaning this test can be used) for the duration of the COVID-19 declaration under Section 564(b)(1) of the Act, 21 U.S.C. section 360bbb-3(b)(1), unless the authorization is terminated or revoked.  Performed at [facility], [place] [address], [place], [name] [address]      Medications:    sodium chloride   Intravenous Once   acyclovir  400 mg Oral BID   Chlorhexidine Gluconate Cloth  6 each Topical Q0600   diazepam  5 mg Oral BID   DULoxetine  30 mg Oral q AM   DULoxetine  60 mg Oral QHS   levETIRAcetam  250 mg Oral BID   mirabegron ER  50 mg Oral QHS   polyethylene glycol  17 g Oral Daily   predniSONE  5 mg Oral Q breakfast    pregabalin  100 mg Oral QHS   QUEtiapine  100 mg Oral QHS   senna-docusate  2 tablet Oral QHS   Continuous Infusions:  sodium chloride 75 mL/hr at 07/22/21 0413      LOS: 7 days   Charlynne Cousins  [facility]  07/22/2021, 7:53 AM

## 2021-07-23 LAB — CBC WITH DIFFERENTIAL/PLATELET
Abs Immature Granulocytes: 0.06 10*3/uL (ref 0.00–0.07)
Basophils Absolute: 0 10*3/uL (ref 0.0–0.1)
Basophils Relative: 0 %
Eosinophils Absolute: 0.3 10*3/uL (ref 0.0–0.5)
Eosinophils Relative: 2 %
HCT: 24.3 % — ABNORMAL LOW (ref 36.0–46.0)
Hemoglobin: 7.6 g/dL — ABNORMAL LOW (ref 12.0–15.0)
Immature Granulocytes: 1 %
Lymphocytes Relative: 31 %
Lymphs Abs: 3.3 10*3/uL (ref 0.7–4.0)
MCH: 31 pg (ref 26.0–34.0)
MCHC: 31.3 g/dL (ref 30.0–36.0)
MCV: 99.2 fL (ref 80.0–100.0)
Monocytes Absolute: 0.7 10*3/uL (ref 0.1–1.0)
Monocytes Relative: 7 %
Neutro Abs: 6.3 10*3/uL (ref 1.7–7.7)
Neutrophils Relative %: 59 %
Platelets: 145 10*3/uL — ABNORMAL LOW (ref 150–400)
RBC: 2.45 MIL/uL — ABNORMAL LOW (ref 3.87–5.11)
RDW: 17.1 % — ABNORMAL HIGH (ref 11.5–15.5)
WBC: 10.7 10*3/uL — ABNORMAL HIGH (ref 4.0–10.5)
nRBC: 0.2 % (ref 0.0–0.2)

## 2021-07-23 LAB — HEMOGLOBIN AND HEMATOCRIT, BLOOD
HCT: 30.1 % — ABNORMAL LOW (ref 36.0–46.0)
Hemoglobin: 9.8 g/dL — ABNORMAL LOW (ref 12.0–15.0)

## 2021-07-23 LAB — PREPARE RBC (CROSSMATCH)

## 2021-07-23 MED ORDER — SODIUM CHLORIDE 0.9% IV SOLUTION: Freq: Once | INTRAVENOUS | Status: AC

## 2021-07-23 NOTE — Progress Notes (Signed)
[facility] PROGRESS NOTE    Progress Note  Katelyn Lamb  LKG:[id number] DOB: 1946/05/18 DOA: 07/14/2021 PCP: [name], MD     Brief Narrative:   Katelyn Lamb is an 75 y.o. 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  Unfortunately she started developing bleeding from her left nare Rhino Rocket was placed that was unsuccessful, ENT was consulted to control the bleeding which initially was unsuccessful patient had to be intubated for interventional procedure with embolization of the left sphenopalatine artery on 07/18/2021 successfully extubated on 07/19/2021.    Assessment/Plan:   Acute Epics taxis of the left nare status post IR embolization of the left sphenopalatine artery/acute blood loss anemia: In the setting of Xarelto She was given Kcentra on 07/18/2021 intubated and subsequently extubated on the 16th. Drop in hemoglobin today to 7.6 we will go ahead and give her 1 unit of packed red blood cells she is mildly tachycardic. ENT recommended no further intervention. Husband is overprotective.  Acute respiratory failure with hypoxia: Intubating for airway protection now on facemask with an FiO2 of 10%.  Now afebrile  New onset of fevers: T-max of 98.8, leukocytosis improving. Chest x-ray showed no acute infiltrates.  Transfer myelitis: Physical therapy was consulted who recommended inpatient rehab. In patient rehab coordinator evaluated the patient deemed her a candidate not for inpatient rehabilitation. She will need skilled nursing facility.  Neurogenic bladder secondary to above: Continue Myrbetriq.   Patient: Continue daily Senokot and  MiraLAX.  Chronic pain: Continue Lyrica and Keppra.  History of DVT: Continue to hold Xarelto.  Acute confusional state: Had an episode of delirium yesterday try to keep the blinds open Wednesday lactulose when at night, have the family reorient them. Avoid narcotics and benzodiazepines. This puts her at high risk of aspiration she has remained afebrile.  Right heel unstageable pressure ulcer RN Pressure Injury Documentation: Pressure Injury 02/12/20 Heel Right Deep Tissue Pressure Injury - Purple or maroon localized area of discolored intact skin or blood-filled blister due to damage of underlying soft tissue from pressure and/or shear. (Active)  02/12/20 0945  Location: Heel  Location Orientation: Right  Staging: Deep Tissue Pressure Injury - Purple or maroon localized area of discolored intact skin or blood-filled blister due to damage of underlying soft tissue from pressure and/or shear.  Wound Description (Comments):   Present on Admission: Yes     DVT prophylaxis: SCD Family Communication:none Status is: Inpatient  Remains inpatient appropriate because: Acute epistaxis        Code Status:     Code Status Orders  (From admission, onward)           Start     Ordered   07/18/21 0551  Full code  Continuous        07/18/21 0554           Code Status History     Date Active Date Inactive Code Status Order ID Comments User Context   07/14/2021 2257 07/18/2021 0554 Full Code [id number]  Etta Quill, DO ED   03/15/2021 1317 03/17/2021 2146 Full Code [id number]  Tacey Ruiz, MD Inpatient   02/12/2020 0803 02/17/2020 2330 Full Code [id number]  Allie Bossier, MD  ED   02/02/2020 0921 02/06/2020 2304 Full Code [id number]  Norval Morton, MD Inpatient   01/23/2020 2147 01/24/2020 2318 Full Code [id number]  Karie Kirks, DO Inpatient   01/23/2020 2003 01/23/2020 2147 Full Code [id number]  Vianne Bulls, MD Inpatient   01/20/2020 1350 01/23/2020 2002 DNR [id number]   Lequita Halt, MD ED   12/06/2019 1949 12/18/2019 1640 DNR [id number]  Desiree Hane, MD Inpatient   07/23/2019 0357 07/28/2019 1900 Full Code [id number]  Shela Leff, MD ED   03/26/2018 2237 03/29/2018 1745 Full Code [id number]  Etta Quill, DO ED   10/08/2016 1801 10/26/2016 1548 Full Code [id number]  Cathlyn Parsons, PA-C Inpatient   10/08/2016 1801 10/08/2016 1801 Full Code [id number]  Cathlyn Parsons, PA-C Inpatient   10/03/2016 0251 10/08/2016 1800 Full Code [id number]  Norval Morton, MD ED   09/20/2016 2106 09/26/2016 2030 Full Code [id number]  Reubin Milan, MD Inpatient         IV Access:   Peripheral IV   Procedures and diagnostic studies:   DG CHEST PORT 1 VIEW  Result Date: 07/22/2021 CLINICAL DATA:  Fever. EXAM: PORTABLE CHEST 1 VIEW COMPARISON:  July 19, 2021 FINDINGS: The ET and NG tubes have been removed. The heart, hila, mediastinum, lungs, and pleura are unremarkable. IMPRESSION: No active disease. Electronically Signed   By: Dorise Bullion III M.D.   On: 07/22/2021 19:06     Medical Consultants:   None.   Subjective:    Katelyn Lamb awake this morning in a good mood hungry.  Objective:    Vitals:   07/23/21 0754 07/23/21 0852 07/23/21 0859 07/23/21 0940  BP: 108/74 119/69  119/69  Pulse: (!) 114  (!) 109 (!) 109  Resp: (!) 24  20 20   Temp: 97.6 F (36.4 C)     TempSrc: Oral     SpO2: 95% 93% 94%   Weight:      Height:       SpO2: 94 % O2 Flow Rate (L/min): 60 L/min FiO2 (%): (!) 10 %   Intake/Output Summary (Last 24 hours) at 07/23/2021 1011 Last data filed at 07/23/2021 0931 Gross per 24 hour  Intake 2943.26 ml  Output 2300 ml  Net 643.26 ml    Filed Weights   07/20/21 0500 07/22/21 0500 07/23/21 0500  Weight: 76.7 kg 79.7 kg 78.3 kg    Exam: General exam: In no acute distress. Respiratory system: Good air movement and clear to auscultation. Cardiovascular system: S1 & S2 heard, RRR. No JVD. Gastrointestinal  system: Abdomen is nondistended, soft and nontender.  Extremities: No pedal edema. Skin: No rashes, lesions or ulcers Psychiatry: Judgment and insight appear intact.   Data Reviewed:    Labs: Basic Metabolic Panel: Recent Labs  Lab 07/18/21 0246 07/18/21 0305 07/18/21 0607 07/19/21 0327 07/20/21 0306 07/21/21 0723  NA 138 137 137 139 140 138  K 7.4* 3.7 3.9 4.3 3.7 3.5  CL 104 105  --  106 108 108  CO2  --  23  --  22 26 26   GLUCOSE 115* 150*  --  130* 115* 109*  BUN 34* 19  --  14 15 8   CREATININE 0.80 0.81  --  0.79 0.67 0.68  CALCIUM  --  8.6*  --  8.4* 8.1* 8.0*  MG  --   --   --  1.8  --  1.7    GFR Estimated Creatinine Clearance: 67.5 mL/min (by C-G formula  based on SCr of 0.68 mg/dL). Liver Function Tests: No results for input(s): AST, ALT, ALKPHOS, BILITOT, PROT, ALBUMIN in the last 168 hours. No results for input(s): LIPASE, AMYLASE in the last 168 hours. No results for input(s): AMMONIA in the last 168 hours. Coagulation profile Recent Labs  Lab 07/18/21 0612  INR 1.3*    COVID-19 Labs  No results for input(s): DDIMER, FERRITIN, LDH, CRP in the last 72 hours.  Lab Results  Component Value Date   SARSCOV2NAA NEGATIVE 07/14/2021   [place] NEGATIVE 03/15/2021   [place] NEGATIVE 02/12/2020   [place] NEGATIVE 02/06/2020    CBC: Recent Labs  Lab 07/18/21 0305 07/18/21 0607 07/20/21 0306 07/20/21 1900 07/21/21 0723 07/21/21 1801 07/23/21 0347  WBC 12.7*   < > 13.7* 14.1* 10.6* 11.7* 10.7*  NEUTROABS 6.7  --  7.4  --   --   --  6.3  HGB 12.9   < > 8.0* 7.8* 7.2* 9.8* 7.6*  HCT 41.0   < > 25.7* 25.7* 23.4* 29.6* 24.3*  MCV 97.4   < > 98.5 100.8* 100.0 95.5 99.2  PLT 259   < > 212 210 188 199 145*   < > = values in this interval not displayed.    Cardiac Enzymes: No results for input(s): CKTOTAL, CKMB, CKMBINDEX, TROPONINI in the last 168 hours. BNP (last 3 results) No results for input(s): PROBNP in the last 8760  hours. CBG: No results for input(s): GLUCAP in the last 168 hours. D-Dimer: No results for input(s): DDIMER in the last 72 hours. Hgb A1c: No results for input(s): HGBA1C in the last 72 hours. Lipid Profile: No results for input(s): CHOL, HDL, LDLCALC, TRIG, CHOLHDL, LDLDIRECT in the last 72 hours. Thyroid function studies: No results for input(s): TSH, T4TOTAL, T3FREE, THYROIDAB in the last 72 hours.  Invalid input(s): FREET3 Anemia work up: No results for input(s): VITAMINB12, FOLATE, FERRITIN, TIBC, IRON, RETICCTPCT in the last 72 hours. Sepsis Labs: Recent Labs  Lab 07/20/21 1900 07/21/21 0723 07/21/21 1801 07/23/21 0347  WBC 14.1* 10.6* 11.7* 10.7*    Microbiology Recent Results (from the past 240 hour(s))  Urine Culture     Status: Abnormal   Collection Time: 07/14/21  7:01 PM   Specimen: Urine, Catheterized  Result Value Ref Range Status   Specimen Description URINE, CATHETERIZED  Final   Special Requests   Final    NONE Performed at [facility], [address], [place], [place] [address]    Culture MULTIPLE SPECIES PRESENT, SUGGEST RECOLLECTION (A)  Final   Report Status 07/16/2021 FINAL  Final  Resp Panel by RT-PCR (Flu A&B, Covid) Nasopharyngeal Swab     Status: None   Collection Time: 07/14/21 11:30 PM   Specimen: Nasopharyngeal Swab; Nasopharyngeal(NP) swabs in vial transport medium  Result Value Ref Range Status   SARS Coronavirus 2 by RT PCR NEGATIVE NEGATIVE Final    Comment: (NOTE) SARS-CoV-2 target nucleic acids are NOT DETECTED.  The SARS-CoV-2 RNA is generally detectable in upper respiratory specimens during the acute phase of infection. The lowest concentration of SARS-CoV-2 viral copies this assay can detect is 138 copies/mL. A negative result does not preclude SARS-Cov-2 infection and should not be used as the sole basis for treatment or other patient management decisions. A negative result may occur with  improper specimen  collection/handling, submission of specimen other than nasopharyngeal swab, presence of viral mutation(s) within the areas targeted by this assay, and inadequate number of viral copies(<138 copies/mL). A  negative result must be combined with clinical observations, patient history, and epidemiological information. The expected result is Negative.  Fact Sheet for Patients:  EntrepreneurPulse.com.au  Fact Sheet for Healthcare Providers:  IncredibleEmployment.be  This test is no t yet approved or cleared by the [place] FDA and  has been authorized for detection and/or diagnosis of SARS-CoV-2 by FDA under an Emergency Use Authorization (EUA). This EUA will remain  in effect (meaning this test can be used) for the duration of the COVID-19 declaration under Section 564(b)(1) of the Act, 21 U.S.C.section 360bbb-3(b)(1), unless the authorization is terminated  or revoked sooner.       Influenza A by PCR NEGATIVE NEGATIVE Final   Influenza B by PCR NEGATIVE NEGATIVE Final    Comment: (NOTE) The Xpert Xpress SARS-CoV-2/FLU/RSV plus assay is intended as an aid in the diagnosis of influenza from Nasopharyngeal swab specimens and should not be used as a sole basis for treatment. Nasal washings and aspirates are unacceptable for Xpert Xpress SARS-CoV-2/FLU/RSV testing.  Fact Sheet for Patients: EntrepreneurPulse.com.au  Fact Sheet for Healthcare Providers: IncredibleEmployment.be  This test is not yet approved or cleared by the [place] FDA and has been authorized for detection and/or diagnosis of SARS-CoV-2 by FDA under an Emergency Use Authorization (EUA). This EUA will remain in effect (meaning this test can be used) for the duration of the COVID-19 declaration under Section 564(b)(1) of the Act, 21 U.S.C. section 360bbb-3(b)(1), unless the authorization is terminated or revoked.  Performed at [facility], [place] [address] [place], [name] [address]      Medications:    sodium chloride   Intravenous Once   acyclovir  400 mg Oral BID   Chlorhexidine Gluconate Cloth  6 each Topical Q0600   diazepam  5 mg Oral BID   DULoxetine  30 mg Oral q AM   DULoxetine  60 mg Oral QHS   levETIRAcetam  250 mg Oral BID   mirabegron ER  50 mg Oral QHS   polyethylene glycol  17 g Oral Daily   predniSONE  5 mg Oral Q breakfast   pregabalin  100 mg Oral QHS   QUEtiapine  100 mg Oral QHS   senna-docusate  2 tablet Oral QHS   Continuous Infusions:  sodium chloride 75 mL/hr at 07/23/21 0614      LOS: 8 days   Charlynne Cousins  [facility]  07/23/2021, 10:11 AM

## 2021-07-24 DIAGNOSIS — R531 Weakness: Secondary | ICD-10-CM

## 2021-07-24 LAB — BPAM RBC
Blood Product Expiration Date: 202211222359
ISSUE DATE / TIME: 202211201328
Unit Type and Rh: 5100

## 2021-07-24 LAB — TYPE AND SCREEN
ABO/RH(D): O POS
Antibody Screen: NEGATIVE
Unit division: 0

## 2021-07-24 NOTE — TOC Progression Note (Signed)
Transition of Care [facility]) - Progression Note    Patient Details  Name: Katelyn Lamb MRN: [id number] Date of Birth: September 20, 1945  Transition of Care [facility]) CM/SW [place], [place] Phone Number: 07/24/2021, 4:55 PM  Clinical Narrative:     Spoke with patient's spouse- informed ( sent text as requested) of bed offers w/ [facility] and [facility]. He requested to see the facilities before making decision 1-2 days, - CSW advised decision need to be made soon as possible at least by tomorrow, & will still need insurance authorization. He states understanding.  CSW called [facility] statrted insurance process-left voice message - CSW will follow up on request tomorrow. CSW will continue to follow and assist with discharge planning  Thurmond Butts, MSW, LCSW Clinical Social Worker    Expected Discharge Plan: [place] Barriers to Discharge: Continued Medical Work up  Expected Discharge Plan and Services Expected Discharge Plan: [place]   Discharge Planning Services: CM Consult Post Acute Care Choice: [place] Living arrangements for the past 2 months: Single Family Home                                       Social Determinants of Health (SDOH) Interventions    Readmission Risk Interventions No flowsheet data found.

## 2021-07-24 NOTE — TOC Progression Note (Addendum)
Transition of Care [facility]) - Progression Note    Patient Details  Name: Katelyn Lamb MRN: [id number] Date of Birth: Oct 30, 1945  Transition of Care [facility]) CM/SW [place], [place] Phone Number: 07/24/2021, 12:01 PM  Clinical Narrative:     CSW following up on bed SNF offers- patient has no confirmed bed availabily at this time: [facility]- no bed until the end of next week [facility]- declined [facility]- declined- no beds [facility]- declined [facility]- waiting on response [facility]- waiting on response [facility] - waiting on [facility]- waiting on response [facility] -waiting on response  CSW will continue to follow and assist with discharge planning  Thurmond Butts, MSW, LCSW Clinical Social Worker     Expected Discharge Plan: [place] Barriers to Discharge: Continued Medical Work up  Expected Discharge Plan and Services Expected Discharge Plan: [place]   Discharge Planning Services: CM Consult Post Acute Care Choice: [place] arrangements for the past 2 months: Single Family Home                                       Social Determinants of Health (SDOH) Interventions    Readmission Risk Interventions No flowsheet data found.

## 2021-07-24 NOTE — Progress Notes (Signed)
Mepliex pads placed under patients chin for comfort.

## 2021-07-24 NOTE — Progress Notes (Signed)
Occupational Therapy Treatment Patient Details Name: Katelyn Lamb MRN: [id number] DOB: [birth year] Today's Date: 07/24/2021   History of present illness Pt is a 75 y.o. F who presents 07/14/2021 with working diagnosis of catheter associated UTI - likely EBSL and increasing bilateral lower extremity weakness. Patient with epistaxis. s/p embolization of L IMAX on 11/15. Intubated 11/15-11/16. 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   OT comments  Pt demonstrates a significant functional decline. Incorporated pericare into session as pt incontinent of BM. Max A+2 to total A to roll side to side. Total A for pericare. Pt assisting with reaching and pulling/holding bed rails; working on bridging position and strengthening in "chair position" @ bed level. Recommend nsg use Maximove to mobilize OOB daily. Pt will benefit from [facility] acute re[facility] at [facility]. Will continue to follow acutely.    Recommendations for follow up therapy are one component of a multi-disciplinary discharge planning process, led by the attending physician.  Recommendations may be updated based on patient status, additional functional criteria and insurance authorization.    Follow Up Recommendations  Skilled nursing-short term rehab (<3 hours/day)    Assistance Recommended at Discharge Frequent or [facility] bed;Wheelchair cushion (measurements OT);Wheelchair (measurements OT);Other (comment)    Recommendations for Other Services      Precautions / Restrictions Precautions Precautions: Fall Precaution Comments: at risk for skin breakdown; suprapubic catheter       Mobility Bed Mobility   Bed Mobility: Rolling Rolling: Max assist;+2 for physical assistance         General bed mobility comments: placed in chair position; pt pulling forward  using bed rails; unable to maintain forward position    Transfers                   General transfer comment: Will need Maximove     Balance     Sitting balance-Leahy Scale: Zero                                     ADL either performed or assessed with clinical judgement   ADL Overall ADL's : Needs assistance/impaired Eating/Feeding: Minimal assistance Eating/Feeding Details (indicate cue type and reason): wtih built up foam; family has been feeding her in the hospital Grooming: Moderate assistance   Upper Body Bathing: Maximal assistance   Lower Body Bathing: Total assistance   Upper Body Dressing : Total assistance   Lower Body Dressing: Total assistance;+2 for physical assistance               Functional mobility during ADLs:  (Maximove will be needed)      Extremity/Trunk Assessment Upper Extremity Assessment Upper Extremity Assessment: Generalized weakness RUE Deficits / Details: AROM to @ 90 shoulder FF; other AROM overall WFL; Strength @ 3/5 elbow/wrist/hand; 2+/5 shoulders   Lower Extremity Assessment Lower Extremity Assessment: Defer to PT evaluation        Vision       Perception     Praxis      Cognition Arousal/Alertness: Awake/alert Behavior During Therapy: Flat affect;Anxious Overall Cognitive Status: Impaired/Different from baseline Area of Impairment: Attention;Memory;Following commands;Safety/judgement;Awareness;Problem solving                   Current Attention Level: Sustained Memory: Decreased short-term memory Following Commands: Follows one step  commands with increased time Safety/Judgement: Decreased awareness of safety;Decreased awareness of deficits Awareness: Emergent Problem Solving: Slow processing;Decreased initiation;Difficulty sequencing;Requires verbal cues;Requires tactile cues            Exercises Exercises: Other exercises;General Upper Extremity General Exercises - Upper  Extremity Shoulder Flexion: AAROM;Both;10 reps;Supine Elbow Flexion: AROM;Both;10 reps;Supine Elbow Extension: AROM;Both;10 reps;Supine Other Exercises Other Exercises: bridging; holding x 10 in bridge position x 10 seconds Other Exercises: reaching cross body   Shoulder Instructions       General Comments      Pertinent Vitals/ Pain       Pain Assessment: Faces Faces Pain Scale: Hurts little more Pain Location:  (generalized) Pain Descriptors / Indicators: Discomfort;Grimacing Pain Intervention(s): Limited activity within patient's tolerance  Home Living                                          Prior Functioning/Environment              Frequency  Min 2X/week        Progress Toward Goals  OT Goals(current goals can now be found in the care plan section)  Progress towards OT goals: Goals drowngraded-see care plan;Not progressing toward goals - comment (decline in function)  Acute Rehab OT Goals Patient Stated Goal: none stated OT Goal Formulation: With patient Time For Goal Achievement: 08/03/21 Potential to Achieve Goals: Fair ADL Goals Pt Will Perform Eating: with adaptive utensils;sitting;with supervision;with set-up Pt Will Perform Grooming: with set-up;sitting Pt Will Perform Lower Body Bathing:  (goal downgraded) Pt Will Perform Lower Body Dressing:  (gaol downgraded) Pt Will Transfer to Toilet:  (goals down graded) Pt Will Perform Tub/Shower Transfer:  (goal down graded) Additional ADL Goal #1: Bed mobility with mod A  in preparation for ADL Additional ADL Goal #2: Pt will maintain midline postural control sitting EOB x 10 min with minA  Plan Discharge plan needs to be updated    Co-evaluation                 AM-PAC OT "6 Clicks" Daily Activity     Outcome Measure   Help from another person eating meals?: A Lot Help from another person taking care of personal grooming?: A Lot Help from another person toileting, which  includes using toliet, bedpan, or urinal?: Total Help from another person bathing (including washing, rinsing, drying)?: A Lot Help from another person to put on and taking off regular upper body clothing?: A Lot Help from another person to put on and taking off regular lower body clothing?: Total 6 Click Score: 10    End of Session Equipment Utilized During Treatment: Oxygen  OT Visit Diagnosis: Unsteadiness on feet (R26.81);Other abnormalities of gait and mobility (R26.89);Muscle weakness (generalized) (M62.81);History of falling (Z91.81);Other symptoms and signs involving cognitive function;Pain Pain - part of body:  (generalized)   Activity Tolerance Patient limited by fatigue   Patient Left in bed;with call bell/phone within reach;with bed alarm set;with SCD's reapplied (modified chair position)   Nurse Communication Mobility status        Time: 3546-5681 OT Time Calculation (min): 27 min  Charges: OT General Charges $OT Visit: 1 Visit OT Treatments $Therapeutic Exercise: 8-22 mins  Maurie Boettcher, OT/L   Acute OT Clinical Specialist [place] Pager [phone] Office [phone]   [facility] 07/24/2021, 3:08 PM

## 2021-07-24 NOTE — Progress Notes (Signed)
[facility] PROGRESS NOTE    Progress Note  ERIKA SLABY  ZDG:[id number] DOB: [birth year] DOA: 07/14/2021 PCP: [name], MD     Brief Narrative:   TOMIA ENLOW is an 75 y.o. 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  Unfortunately she started developing epistaxis from her left nare Rhino Rocket was placed that was unsuccessful, ENT was consulted to control the bleeding which initially was unsuccessful patient had to be intubated for interventional procedure with embolization of the left sphenopalatine artery on 07/18/2021 successfully extubated on 07/19/2021.  She was given Kcentra on 07/18/2021 .    Assessment/Plan:   Acute Epics taxis of the left nare status post IR embolization of the left sphenopalatine artery/acute blood loss anemia: In the setting of Xarelto Had a mild drop in hemoglobin the day before yesterday she was given a unit of packed red blood cells, hemoglobin this morning is 9.8. Tachycardia has resolved. ENT to dictate when to start anticoagulation. Will reconsult physical therapy to keep working with her, she is currently awaiting skilled nursing facility placement.  Acute respiratory failure with hypoxia: Intubating for airway protection now on facemask with an FiO2 of 10%.  Now afebrile  New onset of fevers: T-max of 98.0, leukocytosis is resolved, chest x-ray showed no infiltrate.  Transfer myelitis: Physical therapy was consulted who recommended inpatient rehab. In patient rehab coordinator evaluated the patient deemed her a candidate not for inpatient rehabilitation. She will need skilled nursing facility.  Neurogenic  bladder secondary to above: Continue Myrbetriq.   Patient: Continue daily Senokot and MiraLAX.  Chronic pain: Continue Lyrica and Keppra.  History of DVT: Continue to hold Xarelto.  Acute confusional state: Had an episode of delirium yesterday try to keep the blinds open Wednesday lactulose when at night, have the family reorient them. Avoid narcotics and benzodiazepines. This puts her at high risk of aspiration she has remained afebrile.  Right heel unstageable pressure ulcer RN Pressure Injury Documentation: Pressure Injury 02/12/20 Heel Right Deep Tissue Pressure Injury - Purple or maroon localized area of discolored intact skin or blood-filled blister due to damage of underlying soft tissue from pressure and/or shear. (Active)  02/12/20 0945  Location: Heel  Location Orientation: Right  Staging: Deep Tissue Pressure Injury - Purple or maroon localized area of discolored intact skin or blood-filled blister due to damage of underlying soft tissue from pressure and/or shear.  Wound Description (Comments):   Present on Admission: Yes     DVT prophylaxis: SCD Family Communication:none Status is: Inpatient  Remains inpatient appropriate because: Acute epistaxis        Code Status:     Code Status Orders  (From admission, onward)           Start     Ordered   07/18/21 0551  Full code  Continuous        07/18/21 0554           Code Status History     Date Active Date Inactive Code Status Order ID Comments User Context   07/14/2021 2257 07/18/2021 0554 Full Code [id number]  Etta Quill, DO ED   03/15/2021 1317 03/17/2021 2146 Full Code [id number]  Tacey Ruiz, MD  Inpatient   02/12/2020 0803 02/17/2020 2330 Full Code [id number]  Allie Bossier, MD ED   02/02/2020 [phone] 02/06/2020 2304 Full Code [id number]  Norval Morton, MD Inpatient   01/23/2020 2147 01/24/2020 2318 Full Code [id number]  [place], [place], DO Inpatient   01/23/2020 2003 01/23/2020 2147 Full Code  [id number]  Vianne Bulls, MD Inpatient   01/20/2020 1350 01/23/2020 2002 DNR [id number]  Lequita Halt, MD ED   12/06/2019 1949 12/18/2019 1640 DNR [id number]  Desiree Hane, MD Inpatient   07/23/2019 0357 07/28/2019 1900 Full Code [id number]  Shela Leff, MD ED   03/26/2018 2237 03/29/2018 1745 Full Code [id number]  Etta Quill, DO ED   10/08/2016 1801 10/26/2016 1548 Full Code [id number]  Cathlyn Parsons, PA-C Inpatient   10/08/2016 1801 10/08/2016 1801 Full Code [id number]  Cathlyn Parsons, PA-C Inpatient   10/03/2016 0251 10/08/2016 1800 Full Code [id number]  Norval Morton, MD ED   09/20/2016 2106 09/26/2016 2030 Full Code [id number]  Reubin Milan, MD Inpatient         IV Access:   Peripheral IV   Procedures and diagnostic studies:   No results found.   Medical Consultants:   None.   Subjective:    Felisha I Keel no complaints this morning.  Objective:    Vitals:   07/24/21 0326 07/24/21 0403 07/24/21 0800 07/24/21 0905  BP: 103/74  103/71   Pulse: 96  92 90  Resp: 20  20 (!) 22  Temp: 97.6 F (36.4 C)  98 F (36.7 C)   TempSrc: Axillary  Oral   SpO2: 94%  (!) 89%   Weight:  78.2 kg    Height:       SpO2: (!) 89 % O2 Flow Rate (L/min): 10 L/min FiO2 (%): 60 %   Intake/Output Summary (Last 24 hours) at 07/24/2021 1026 Last data filed at 07/24/2021 0300 Gross per 24 hour  Intake 1045 ml  Output 1550 ml  Net -505 ml    Filed Weights   07/22/21 0500 07/23/21 0500 07/24/21 0403  Weight: 79.7 kg 78.3 kg 78.2 kg    Exam: General exam: In no acute distress. Respiratory system: Good air movement and clear to auscultation. Cardiovascular system: S1 & S2 heard, RRR. No JVD. Gastrointestinal system: Abdomen is nondistended, soft and nontender.  Extremities: No pedal edema. Skin: No rashes, lesions or ulcers  Data Reviewed:    Labs: Basic Metabolic Panel: Recent Labs  Lab 07/18/21 0246 07/18/21 0305 07/18/21 0607 07/19/21 0327  07/20/21 0306 07/21/21 0723  NA 138 137 137 139 140 138  K 7.4* 3.7 3.9 4.3 3.7 3.5  CL 104 105  --  106 108 108  CO2  --  23  --  22 26 26   GLUCOSE 115* 150*  --  130* 115* 109*  BUN 34* 19  --  14 15 8   CREATININE 0.80 0.81  --  0.79 0.67 0.68  CALCIUM  --  8.6*  --  8.4* 8.1* 8.0*  MG  --   --   --  1.8  --  1.7    GFR Estimated Creatinine Clearance: 67.4 mL/min (by C-G formula based on SCr of 0.68 mg/dL). Liver Function Tests: No results for input(s): AST, ALT, ALKPHOS, BILITOT, PROT, ALBUMIN in the last 168 hours. No results for input(s): LIPASE, AMYLASE in the last 168 hours. No results for input(s): AMMONIA in the last 168 hours. Coagulation profile Recent Labs  Lab 07/18/21 [id number]  INR 1.3*    COVID-19 Labs  No results for input(s): DDIMER, FERRITIN, LDH, CRP in the last 72 hours.  Lab Results  Component Value Date   SARSCOV2NAA NEGATIVE 07/14/2021   [place] NEGATIVE 03/15/2021   [place] NEGATIVE 02/12/2020   [place] NEGATIVE 02/06/2020    CBC: Recent Labs  Lab 07/18/21 0305 07/18/21 0607 07/20/21 0306 07/20/21 1900 07/21/21 0723 07/21/21 1801 07/23/21 0347 07/23/21 2039  WBC 12.7*   < > 13.7* 14.1* 10.6* 11.7* 10.7*  --   NEUTROABS 6.7  --  7.4  --   --   --  6.3  --   HGB 12.9   < > 8.0* 7.8* 7.2* 9.8* 7.6* 9.8*  HCT 41.0   < > 25.7* 25.7* 23.4* 29.6* 24.3* 30.1*  MCV 97.4   < > 98.5 100.8* 100.0 95.5 99.2  --   PLT 259   < > 212 210 188 199 145*  --    < > = values in this interval not displayed.    Cardiac Enzymes: No results for input(s): CKTOTAL, CKMB, CKMBINDEX, TROPONINI in the last 168 hours. BNP (last 3 results) No results for input(s): PROBNP in the last 8760 hours. CBG: No results for input(s): GLUCAP in the last 168 hours. D-Dimer: No results for input(s): DDIMER in the last 72 hours. Hgb A1c: No results for input(s): HGBA1C in the last 72 hours. Lipid Profile: No results for input(s): CHOL, HDL, LDLCALC, TRIG,  CHOLHDL, LDLDIRECT in the last 72 hours. Thyroid function studies: No results for input(s): TSH, T4TOTAL, T3FREE, THYROIDAB in the last 72 hours.  Invalid input(s): FREET3 Anemia work up: No results for input(s): VITAMINB12, FOLATE, FERRITIN, TIBC, IRON, RETICCTPCT in the last 72 hours. Sepsis Labs: Recent Labs  Lab 07/20/21 1900 07/21/21 0723 07/21/21 1801 07/23/21 0347  WBC 14.1* 10.6* 11.7* 10.7*    Microbiology Recent Results (from the past 240 hour(s))  Urine Culture     Status: Abnormal   Collection Time: 07/14/21  7:01 PM   Specimen: Urine, Catheterized  Result Value Ref Range Status   Specimen Description URINE, CATHETERIZED  Final   Special Requests   Final    NONE Performed at [facility], [address], [place], [place] [address]    Culture MULTIPLE SPECIES PRESENT, SUGGEST RECOLLECTION (A)  Final   Report Status 07/16/2021 FINAL  Final  Resp Panel by RT-PCR (Flu A&B, Covid) Nasopharyngeal Swab     Status: None   Collection Time: 07/14/21 11:30 PM   Specimen: Nasopharyngeal Swab; Nasopharyngeal(NP) swabs in vial transport medium  Result Value Ref Range Status   SARS Coronavirus 2 by RT PCR NEGATIVE NEGATIVE Final    Comment: (NOTE) SARS-CoV-2 target nucleic acids are NOT DETECTED.  The SARS-CoV-2 RNA is generally detectable in upper respiratory specimens during the acute phase of infection. The lowest concentration of SARS-CoV-2 viral copies this assay can detect is 138 copies/mL. A negative result does not preclude SARS-Cov-2 infection and should not be used as the sole basis for treatment or other patient management decisions. A negative result may occur with  improper specimen collection/handling, submission of specimen other than nasopharyngeal swab, presence of viral mutation(s) within the areas targeted by this assay, and inadequate number of viral copies(<138 copies/mL). A negative result must be combined with clinical observations, patient  history, and epidemiological information. The expected result is Negative.  Fact Sheet for Patients:  EntrepreneurPulse.com.au  Fact Sheet for Healthcare Providers:  IncredibleEmployment.be  This test is no  t yet approved or cleared by the [place] and  has been authorized for detection and/or diagnosis of SARS-CoV-2 by FDA under an Emergency Use Authorization (EUA). This EUA will remain  in effect (meaning this test can be used) for the duration of the COVID-19 declaration under Section 564(b)(1) of the Act, 21 U.S.C.section 360bbb-3(b)(1), unless the authorization is terminated  or revoked sooner.       Influenza A by PCR NEGATIVE NEGATIVE Final   Influenza B by PCR NEGATIVE NEGATIVE Final    Comment: (NOTE) The Xpert Xpress SARS-CoV-2/FLU/RSV plus assay is intended as an aid in the diagnosis of influenza from Nasopharyngeal swab specimens and should not be used as a sole basis for treatment. Nasal washings and aspirates are unacceptable for Xpert Xpress SARS-CoV-2/FLU/RSV testing.  Fact Sheet for Patients: EntrepreneurPulse.com.au  Fact Sheet for Healthcare Providers: IncredibleEmployment.be  This test is not yet approved or cleared by the [place] FDA and has been authorized for detection and/or diagnosis of SARS-CoV-2 by FDA under an Emergency Use Authorization (EUA). This EUA will remain in effect (meaning this test can be used) for the duration of the COVID-19 declaration under Section 564(b)(1) of the Act, 21 U.S.C. section 360bbb-3(b)(1), unless the authorization is terminated or revoked.  Performed at [facility], [place] [address] [place], [name] [address]      Medications:    acyclovir  400 mg Oral BID   Chlorhexidine Gluconate Cloth  6 each Topical Q0600   diazepam  5 mg Oral BID   DULoxetine  30 mg Oral q AM   DULoxetine  60 mg Oral QHS   levETIRAcetam  250 mg  Oral BID   mirabegron ER  50 mg Oral QHS   polyethylene glycol  17 g Oral Daily   predniSONE  5 mg Oral Q breakfast   pregabalin  100 mg Oral QHS   QUEtiapine  100 mg Oral QHS   senna-docusate  2 tablet Oral QHS   Continuous Infusions:      LOS: 9 days   Charlynne Cousins  [facility]  07/24/2021, 10:26 AM

## 2021-07-25 MED ORDER — DICLOFENAC SODIUM 1 % EX GEL
2.0000 g | Freq: Four times a day (QID) | CUTANEOUS | Status: DC | PRN
  Filled 2021-07-25: qty 100

## 2021-07-25 NOTE — Progress Notes (Signed)
Found pt off face tent. No resp distress noted. Sat 92-94% on RA. Will continue to monitor.

## 2021-07-25 NOTE — Progress Notes (Signed)
[facility] PROGRESS NOTE    Progress Note  Katelyn Lamb  GQQ:[id number] DOB: 05-14-1946 DOA: 07/14/2021 PCP: [name], MD     Brief Narrative:   Katelyn Lamb is an 75 y.o. 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  Unfortunately she started developing epistaxis from her left nare Rhino Rocket was placed that was unsuccessful, ENT was consulted to control the bleeding which initially was unsuccessful patient had to be intubated for interventional procedure with embolization of the left sphenopalatine artery on 07/18/2021 successfully extubated on 07/19/2021.  She was given Kcentra on 07/18/2021 .   Medically stable to be transferred to skilled nursing facility. Assessment/Plan:   Acute Epics taxis of the left nare status post IR embolization of the left sphenopalatine artery/acute blood loss anemia: In the setting of Xarelto, she is not a candidate for anticoagulation, she will follow-up with ENT as an outpatient and ENT to dictate when to start anticoagulation. Status post 1 unit of packed red blood cells her hemoglobin has remained stable since then. Tachycardia has significantly improved. She is currently awaiting skilled nursing facility placement. Physical therapy to continue to work with the patient.  Acute respiratory failure with hypoxia: Intubating for airway protection now on facemask with an FiO2 of 10%.  Wean oxygen anything above 88% on her is good.  New onset of fevers: T-max of 98.0, leukocytosis is resolved, chest x-ray showed no infiltrate. Aspiration has been ruled out.  Transfer myelitis: Physical therapy was consulted who  recommended inpatient rehab. In patient rehab coordinator evaluated the patient deemed her a candidate not for inpatient rehabilitation. She will need skilled nursing facility.  Neurogenic bladder secondary to above: Continue Myrbetriq.   Constipation Continue daily Senokot and MiraLAX.  Chronic pain: Continue Lyrica and Keppra.  History of DVT: Continue to hold Xarelto. She has had 2 episodes of DVT in the past she will probably benefit from restarting Xarelto in the future.  Acute confusional state: Still having episodes of confusion fluctuated within a 24-hour period. Avoid narcotics and benzodiazepines.  Right heel unstageable pressure ulcer RN Pressure Injury Documentation: Pressure Injury 02/12/20 Heel Right Deep Tissue Pressure Injury - Purple or maroon localized area of discolored intact skin or blood-filled blister due to damage of underlying soft tissue from pressure and/or shear. (Active)  02/12/20 0945  Location: Heel  Location Orientation: Right  Staging: Deep Tissue Pressure Injury - Purple or maroon localized area of discolored intact skin or blood-filled blister due to damage of underlying soft tissue from pressure and/or shear.  Wound Description (Comments):   Present on Admission: Yes     DVT prophylaxis: SCD Family Communication:none Status is: Inpatient  Remains inpatient appropriate because: Acute epistaxis        Code Status:     Code Status Orders  (From admission, onward)           Start     Ordered   07/18/21 0551  Full code  Continuous        07/18/21 0554           Code Status History     Date Active Date Inactive Code Status Order ID Comments User Context   07/14/2021 2257  07/18/2021 0554 Full Code [id number]  Etta Quill, DO ED   03/15/2021 1317 03/17/2021 2146 Full Code [id number]  Tacey Ruiz, MD Inpatient   02/12/2020 0803 02/17/2020 2330 Full Code [id number]  Allie Bossier, MD ED   02/02/2020 424 203 8915 02/06/2020 2304  Full Code [id number]  Norval Morton, MD Inpatient   01/23/2020 2147 01/24/2020 2318 Full Code [id number]  [place], [place], DO Inpatient   01/23/2020 2003 01/23/2020 2147 Full Code [id number]  Vianne Bulls, MD Inpatient   01/20/2020 1350 01/23/2020 2002 DNR [id number]  Lequita Halt, MD ED   12/06/2019 1949 12/18/2019 1640 DNR [id number]  Desiree Hane, MD Inpatient   07/23/2019 0357 07/28/2019 1900 Full Code [id number]  Shela Leff, MD ED   03/26/2018 2237 03/29/2018 1745 Full Code [id number]  Etta Quill, DO ED   10/08/2016 1801 10/26/2016 1548 Full Code [id number]  Cathlyn Parsons, PA-C Inpatient   10/08/2016 1801 10/08/2016 1801 Full Code [id number]  Cathlyn Parsons, PA-C Inpatient   10/03/2016 0251 10/08/2016 1800 Full Code [id number]  Norval Morton, MD ED   09/20/2016 2106 09/26/2016 2030 Full Code [id number]  Reubin Milan, MD Inpatient         IV Access:   Peripheral IV   Procedures and diagnostic studies:   No results found.   Medical Consultants:   None.   Subjective:    Trinady I Stephenson no complaints today.  Objective:    Vitals:   07/25/21 0435 07/25/21 0500 07/25/21 0748 07/25/21 0800  BP:   91/67 97/73  Pulse: (!) 109  (!) 109 (!) 105  Resp: 18  19 17   Temp:   98.1 F (36.7 C)   TempSrc:   Oral   SpO2: 93%  95% 96%  Weight:  78.8 kg    Height:       SpO2: 96 % O2 Flow Rate (L/min): 10 L/min FiO2 (%): 50 %   Intake/Output Summary (Last 24 hours) at 07/25/2021 0910 Last data filed at 07/25/2021 0438 Gross per 24 hour  Intake 400 ml  Output 1600 ml  Net -1200 ml    Filed Weights   07/23/21 0500 07/24/21 0403 07/25/21 0500  Weight: 78.3 kg 78.2 kg 78.8 kg    Exam: General exam: In no acute distress. Respiratory system: Good air movement and clear to auscultation. Cardiovascular system: S1 & S2 heard, RRR. No JVD. Gastrointestinal system: Abdomen is nondistended, soft and nontender.  Extremities: No pedal edema. Skin: No rashes, lesions or  ulcers Psychiatry: Judgement and insight appear normal. Mood & affect appropriate.  Data Reviewed:    Labs: Basic Metabolic Panel: Recent Labs  Lab 07/19/21 0327 07/20/21 0306 07/21/21 0723  NA 139 140 138  K 4.3 3.7 3.5  CL 106 108 108  CO2 22 26 26   GLUCOSE 130* 115* 109*  BUN 14 15 8   CREATININE 0.79 0.67 0.68  CALCIUM 8.4* 8.1* 8.0*  MG 1.8  --  1.7    GFR Estimated Creatinine Clearance: 67.7 mL/min (by C-G formula based on SCr of 0.68 mg/dL). Liver Function Tests: No results for input(s): AST, ALT, ALKPHOS, BILITOT, PROT, ALBUMIN in the last 168 hours. No results for input(s): LIPASE, AMYLASE in the last 168 hours. No results for input(s): AMMONIA in the last 168 hours. Coagulation profile No results for input(s): INR, PROTIME in the last 168 hours.  COVID-19 Labs  No results for input(s): DDIMER, FERRITIN, LDH, CRP in the last 72 hours.  Lab Results  Component Value Date   SARSCOV2NAA NEGATIVE 07/14/2021   [place] NEGATIVE 03/15/2021   [place] NEGATIVE 02/12/2020   [place] NEGATIVE 02/06/2020    CBC: Recent Labs  Lab 07/20/21 0306 07/20/21 1900 07/21/21 0723 07/21/21 1801 07/23/21 0347 07/23/21 2039  WBC 13.7* 14.1* 10.6* 11.7* 10.7*  --   NEUTROABS 7.4  --   --   --  6.3  --   HGB 8.0* 7.8* 7.2* 9.8* 7.6* 9.8*  HCT 25.7* 25.7* 23.4* 29.6* 24.3* 30.1*  MCV 98.5 100.8* 100.0 95.5 99.2  --   PLT 212 210 188 199 145*  --     Cardiac Enzymes: No results for input(s): CKTOTAL, CKMB, CKMBINDEX, TROPONINI in the last 168 hours. BNP (last 3 results) No results for input(s): PROBNP in the last 8760 hours. CBG: No results for input(s): GLUCAP in the last 168 hours. D-Dimer: No results for input(s): DDIMER in the last 72 hours. Hgb A1c: No results for input(s): HGBA1C in the last 72 hours. Lipid Profile: No results for input(s): CHOL, HDL, LDLCALC, TRIG, CHOLHDL, LDLDIRECT in the last 72 hours. Thyroid function studies: No results for  input(s): TSH, T4TOTAL, T3FREE, THYROIDAB in the last 72 hours.  Invalid input(s): FREET3 Anemia work up: No results for input(s): VITAMINB12, FOLATE, FERRITIN, TIBC, IRON, RETICCTPCT in the last 72 hours. Sepsis Labs: Recent Labs  Lab 07/20/21 1900 07/21/21 0723 07/21/21 1801 07/23/21 0347  WBC 14.1* 10.6* 11.7* 10.7*    Microbiology No results found for this or any previous visit (from the past 240 hour(s)).    Medications:    acyclovir  400 mg Oral BID   Chlorhexidine Gluconate Cloth  6 each Topical Q0600   diazepam  5 mg Oral BID   DULoxetine  30 mg Oral q AM   DULoxetine  60 mg Oral QHS   levETIRAcetam  250 mg Oral BID   mirabegron ER  50 mg Oral QHS   polyethylene glycol  17 g Oral Daily   predniSONE  5 mg Oral Q breakfast   pregabalin  100 mg Oral QHS   QUEtiapine  100 mg Oral QHS   senna-docusate  2 tablet Oral QHS   Continuous Infusions:      LOS: 10 days   Charlynne Cousins  [facility]  07/25/2021, 9:10 AM

## 2021-07-25 NOTE — TOC Progression Note (Signed)
Transition of Care [facility]) - Progression Note    Patient Details  Name: Katelyn Lamb MRN: [id number] Date of Birth: 12-17-1945  Transition of Care [name]) CM/SW [place], [place] Phone Number: 07/25/2021, 3:36 PM  Clinical Narrative:     Katelyn Lamb  rescinded bed offer   CSW will continue to follow and assist with discharge planning.  Katelyn Lamb, MSW, LCSW Clinical Social Worker    Expected Discharge Plan: Skilled Nursing Facility Barriers to Discharge: Continued Medical Work up  Expected Discharge Plan and Services Expected Discharge Plan: [place]   Discharge Planning Services: CM Consult Post Acute Care Choice: [place] Living arrangements for the past 2 months: Single Family Home                                       Social Determinants of Health (SDOH) Interventions    Readmission Risk Interventions No flowsheet data found.

## 2021-07-25 NOTE — Progress Notes (Addendum)
Physical Therapy Treatment Patient Details Name: Katelyn Lamb MRN: [id number] DOB: 06-06-1946 Today's Date: 07/25/2021   History of Present Illness Pt is a 75 y.o. F who presents 07/14/2021 with working diagnosis of catheter associated UTI - likely EBSL and increasing bilateral lower extremity weakness. Patient with epistaxis. s/p embolization of L IMAX on 11/15. Intubated 11/15-11/16. 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    PT Comments    Pt was extremely sleepy today, initially unable to be woken up and then husband was able to get her awake.  Spent time to get her more alert and then did strengthening to BLE's with pt remaining in bed.  Sleepy and not safe for one person to sit up, but recommend she get up to chair with two person help tomorrow.  Follow along for sitting balance, endurance out of bed in chair and LE strengthening.  Recommendations for follow up therapy are one component of a multi-disciplinary discharge planning process, led by the attending physician.  Recommendations may be updated based on patient status, additional functional criteria and insurance authorization.  Follow Up Recommendations  Acute inpatient rehab (3hours/day)     Assistance Recommended at Discharge Frequent or [facility] bed;Other (comment)    Recommendations for Other Services Rehab consult     Precautions / Restrictions Precautions Precautions: Fall Precaution Comments: monitor positioning to protect bony prominences Restrictions Weight Bearing Restrictions: No     Mobility  Bed Mobility Overal bed mobility: Needs Assistance Bed Mobility: Rolling Rolling: Max assist              Transfers Overall transfer level: Needs assistance                 General transfer comment: unable to get  second person to help    Ambulation/Gait                   Stairs             Wheelchair Mobility    Modified Rankin (Stroke Patients Only)       Balance                                            Cognition Arousal/Alertness: Lethargic Behavior During Therapy: Flat affect Overall Cognitive Status: Impaired/Different from baseline Area of Impairment: Following commands;Attention                   Current Attention Level: Sustained Memory: Decreased short-term memory;Decreased recall of precautions                  Exercises General Exercises - Lower Extremity Ankle Circles/Pumps: AAROM;5 reps Long Arc Quad: AAROM;10 reps Heel Slides: AAROM;10 reps Hip ABduction/ADduction: AAROM;10 reps Straight Leg Raises: AAROM;10 reps    General Comments General comments (skin integrity, edema, etc.): pt is qulte lethargic and cannot be safely assisted to side of bed with one person      Pertinent Vitals/Pain Pain Assessment: No/denies pain    Home Living                          Prior Function            PT Goals (current goals can now  be found in the care plan section) Acute Rehab PT Goals Patient Stated Goal: pt spouse would like pt to be able to get to a level where he can assist her Progress towards PT goals: Not progressing toward goals - comment    Frequency    Min 3X/week      PT Plan Current plan remains appropriate    Co-evaluation              AM-PAC PT "6 Clicks" Mobility   Outcome Measure  Help needed turning from your back to your side while in a flat bed without using bedrails?: A Lot Help needed moving from lying on your back to sitting on the side of a flat bed without using bedrails?: A Lot Help needed moving to and from a bed to a chair (including a wheelchair)?: Total Help needed standing up from a chair using your arms (e.g., wheelchair or bedside chair)?: Total Help needed to  walk in hospital room?: Total Help needed climbing 3-5 steps with a railing? : Total 6 Click Score: 8    End of Session Equipment Utilized During Treatment: Oxygen Activity Tolerance: Patient tolerated treatment well Patient left: in bed;with call bell/phone within reach;with bed alarm set;with family/visitor present (lethargic) Nurse Communication: Mobility status PT Visit Diagnosis: Unsteadiness on feet (R26.81);Muscle weakness (generalized) (M62.81);History of falling (Z91.81);Difficulty in walking, not elsewhere classified (R26.2)     Time: 7711-6579 PT Time Calculation (min) (ACUTE ONLY): 27 min  Charges:  $Therapeutic Exercise: 23-37 mins                 Ramond Dial 07/25/2021, 8:36 PM  Mee Hives, PT PhD Acute Rehab Dept. Number: [place]

## 2021-07-25 NOTE — Care Management Important Message (Signed)
Important Message  Patient Details  Name: Katelyn Lamb MRN: [id number] Date of Birth: October 23, 1945   Medicare Important Message Given:  Yes     Joetta Manners 07/25/2021, 11:39 AM

## 2021-07-26 DIAGNOSIS — G8222 Paraplegia, incomplete: Secondary | ICD-10-CM | POA: Diagnosis not present

## 2021-07-26 DIAGNOSIS — R531 Weakness: Secondary | ICD-10-CM | POA: Diagnosis not present

## 2021-07-26 DIAGNOSIS — R040 Epistaxis: Secondary | ICD-10-CM | POA: Diagnosis not present

## 2021-07-26 LAB — COMPREHENSIVE METABOLIC PANEL
ALT: 37 U/L (ref 0–44)
AST: 23 U/L (ref 15–41)
Albumin: 2.6 g/dL — ABNORMAL LOW (ref 3.5–5.0)
Alkaline Phosphatase: 97 U/L (ref 38–126)
Anion gap: 6 (ref 5–15)
BUN: 12 mg/dL (ref 8–23)
CO2: 30 mmol/L (ref 22–32)
Calcium: 8.6 mg/dL — ABNORMAL LOW (ref 8.9–10.3)
Chloride: 102 mmol/L (ref 98–111)
Creatinine, Ser: 0.81 mg/dL (ref 0.44–1.00)
GFR, Estimated: >60 mL/min (ref >60)
Glucose, Bld: 105 mg/dL — ABNORMAL HIGH (ref 70–99)
Potassium: 3.6 mmol/L (ref 3.5–5.1)
Sodium: 138 mmol/L (ref 135–145)
Total Bilirubin: 0.3 mg/dL (ref 0.3–1.2)
Total Protein: 5.6 g/dL — ABNORMAL LOW (ref 6.5–8.1)

## 2021-07-26 LAB — CBC
HCT: 33.4 % — ABNORMAL LOW (ref 36.0–46.0)
Hemoglobin: 10.3 g/dL — ABNORMAL LOW (ref 12.0–15.0)
MCH: 30.6 pg (ref 26.0–34.0)
MCHC: 30.8 g/dL (ref 30.0–36.0)
MCV: 99.1 fL (ref 80.0–100.0)
Platelets: 177 10*3/uL (ref 150–400)
RBC: 3.37 MIL/uL — ABNORMAL LOW (ref 3.87–5.11)
RDW: 15.6 % — ABNORMAL HIGH (ref 11.5–15.5)
WBC: 10.9 10*3/uL — ABNORMAL HIGH (ref 4.0–10.5)
nRBC: 0 % (ref 0.0–0.2)

## 2021-07-26 MED ORDER — SODIUM CHLORIDE 0.9 % IV BOLUS
250.0000 mL | Freq: Once | INTRAVENOUS | Status: AC
  Administered 2021-07-26: 250 mL via INTRAVENOUS

## 2021-07-26 NOTE — TOC Progression Note (Signed)
Transition of Care [facility]) - Progression Note    Patient Details  Name: Katelyn Lamb MRN: [id number] Date of Birth: 12/20/1945  Transition of Care [facility]) CM/SW [place], [place] Phone Number: 07/26/2021, 1:42 PM  Clinical Narrative:     CSW  met with patient at bedside. CSW spoke with patient's spouse- CSW informed  [facility] unable to met the patient's needs and has declined. CSW informed [facility] is the only offer at this time. He requested CSW widen search to [facility], [place], and [employer] area, stating " I need more options". CSW advised will work diligently to try and secure additional offers but up coming holiday may prevent CSW from getting a response today from some facilities,reminding that patient still need insurance authorization for SNF and she is close/ready for discharge. He states understanding.  TOC will continue to follow and assist with discharge planning.  Thurmond Butts, MSW, LCSW Clinical Social Worker    Expected Discharge Plan: Skilled Nursing Facility Barriers to Discharge: Continued Medical Work up  Expected Discharge Plan and Services Expected Discharge Plan: [place]   Discharge Planning Services: CM Consult Post Acute Care Choice: [place] Living arrangements for the past 2 months: Single Family Home                                       Social Determinants of Health (SDOH) Interventions    Readmission Risk Interventions No flowsheet data found.

## 2021-07-26 NOTE — TOC Progression Note (Signed)
Transition of Care [facility]) - Progression Note    Patient Details  Name: Katelyn Lamb MRN: [id number] Date of Birth: Jan 11, 1946  Transition of Care [facility]) CM/SW [place],  Phone Number: 07/26/2021, 3:57 PM  Clinical Narrative:     Insurance remains pending-  CSW SNF search;  left voice messages: [place] [phone] [facility] # [place] #[phone]- no answer   Declined: [facility] not in network   [facility] will continue to follow and assist with discharge planning.  Thurmond Butts, MSW, LCSW Clinical Social Worker    Expected Discharge Plan: Skilled Nursing Facility Barriers to Discharge: Continued Medical Work up  Expected Discharge Plan and Services Expected Discharge Plan: [place]   Discharge Planning Services: CM Consult Post Acute Care Choice: [place] Living arrangements for the past 2 months: Single Family Home                                       Social Determinants of Health (SDOH) Interventions    Readmission Risk Interventions No flowsheet data found.

## 2021-07-26 NOTE — Progress Notes (Signed)
Physical Therapy Treatment Patient Details Name: Katelyn Lamb MRN: [id number] DOB: Jan 17, 1946 Today's Date: 07/26/2021   History of Present Illness Pt is a 75 y.o. F who presents 07/14/2021 with working diagnosis of catheter associated UTI - likely EBSL and increasing bilateral lower extremity weakness. Patient with epistaxis. s/p embolization of L IMAX on 11/15. Intubated 11/15-11/16. 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    PT Comments    Patient more awake this session. Patient requires maxA for bed mobility and mostly maxA to maintain sitting balance at EOB. One instance of min guard but lasted <30 seconds. Patient able to tolerate sitting EOB x 15 minutes this date with VSS. Patient performed seated exercises focusing on LE strengthening. Continue to recommend SNF for ongoing Physical Therapy.      Recommendations for follow up therapy are one component of a multi-disciplinary discharge planning process, led by the attending physician.  Recommendations may be updated based on patient status, additional functional criteria and insurance authorization.  Follow Up Recommendations  Skilled nursing-short term rehab (<3 hours/day)     Assistance Recommended at Discharge Frequent or [facility] bed;Wheelchair (measurements PT);Wheelchair cushion (measurements PT)    Recommendations for Other Services       Precautions / Restrictions Precautions Precautions: Fall Precaution Comments: monitor positioning to protect bony prominences Restrictions Weight Bearing Restrictions: No     Mobility  Bed Mobility Overal bed mobility: Needs Assistance Bed Mobility: Supine to Sit;Sit to Supine     Supine to sit: Max assist Sit to supine: Max assist   General bed mobility comments: maxA to come to EOB     Transfers                   General transfer comment: deferred due to poor sitting balance    Ambulation/Gait                   Stairs             Wheelchair Mobility    Modified Rankin (Stroke Patients Only)       Balance Overall balance assessment: Needs assistance Sitting-balance support: Feet supported;Single extremity supported Sitting balance-Leahy Scale: Poor Sitting balance - Comments: L lateral lean. Requiring maxA majority of time. One instance of min guard but lasted <30 seconds. Unable to dual task without losing balance Postural control: Left lateral lean                                  Cognition Arousal/Alertness: Awake/alert Behavior During Therapy: Flat affect Overall Cognitive Status: Impaired/Different from baseline Area of Impairment: Following commands;Attention                   Current Attention Level: Sustained   Following Commands: Follows one step commands with increased time       General Comments: slow processing requiring increased time to follow commands        Exercises General Exercises - Lower Extremity Ankle Circles/Pumps: AROM;Both;10 reps;Seated Long Arc Quad: AROM;Both;10 reps;Seated Hip Flexion/Marching: AAROM;Both;5 reps;Seated    General Comments        Pertinent Vitals/Pain Pain Assessment: Faces Faces Pain Scale: Hurts little more Pain Location: bottom Pain Descriptors / Indicators: Discomfort;Grimacing Pain Intervention(s): Monitored during session;Repositioned    Home Living  Prior Function            PT Goals (current goals can now be found in the care plan section) Acute Rehab PT Goals Patient Stated Goal: pt spouse would like pt to be able to get to a level where he can assist her PT Goal Formulation: With patient/family Time For Goal Achievement: 07/31/21 Potential to Achieve Goals: Fair Progress towards PT goals:  Progressing toward goals    Frequency    Min 2X/week      PT Plan Current plan remains appropriate    Co-evaluation              AM-PAC PT "6 Clicks" Mobility   Outcome Measure  Help needed turning from your back to your side while in a flat bed without using bedrails?: A Lot Help needed moving from lying on your back to sitting on the side of a flat bed without using bedrails?: A Lot Help needed moving to and from a bed to a chair (including a wheelchair)?: Total Help needed standing up from a chair using your arms (e.g., wheelchair or bedside chair)?: Total Help needed to walk in hospital room?: Total Help needed climbing 3-5 steps with a railing? : Total 6 Click Score: 8    End of Session Equipment Utilized During Treatment: Oxygen Activity Tolerance: Patient tolerated treatment well Patient left: in bed;with call bell/phone within reach;with bed alarm set Nurse Communication: Mobility status PT Visit Diagnosis: Unsteadiness on feet (R26.81);Muscle weakness (generalized) (M62.81);History of falling (Z91.81);Difficulty in walking, not elsewhere classified (R26.2)     Time: 1630-1650 PT Time Calculation (min) (ACUTE ONLY): 20 min  Charges:  $Therapeutic Exercise: 8-22 mins                     [place]ia Harral A. Gilford Rile PT, DPT Acute Rehabilitation Services Pager [phone] Office [phone]    Linna Hoff 07/26/2021, 5:41 PM

## 2021-07-26 NOTE — TOC Progression Note (Signed)
Transition of Care [facility]) - Progression Note    Patient Details  Name: Katelyn Lamb MRN: [id number] Date of Birth: December 03, 1945  Transition of Care [facility]) CM/SW [place], [place] Phone Number: 07/26/2021, 1:40 PM  Clinical Narrative:     CSW sent secured chat  to MD- informed of Peer to Peer request by [employer] w/ Dr. Amalia Hailey # [phone]  CSW waiting on outcome-  Thurmond Butts, MSW, LCSW Clinical Social Worker    Expected Discharge Plan: [place] Barriers to Discharge: Continued Medical Work up  Expected Discharge Plan and Services Expected Discharge Plan: [place]   Discharge Planning Services: CM Consult Post Acute Care Choice: [place] arrangements for the past 2 months: Single Family Home                                       Social Determinants of Health (SDOH) Interventions    Readmission Risk Interventions No flowsheet data found.

## 2021-07-26 NOTE — Progress Notes (Signed)
[facility]  PROGRESS NOTE  Katelyn Lamb EYC:[id number] DOB: 1945-11-08 DOA: 07/14/2021 PCP: [name], MD   Brief HPI:   75 year old female with a history of encephalomyelitis and transverse colitis HSV 5 years ago prior to admission, neurogenic bladder with suprapubic catheter, incomplete paraplegia, recurrent UTIs was admitted on 07/14/2021 due to weakness and unable to ambulate due to UTI.  Was started on meropenem.  ID was consulted on 07/15/2021 and antibiotics were held as it was thought to be due to colonization.  Unfortunately she developed epistaxis from left nare, Rhino Rocket was placed that was unsuccessful.  ENT was consulted to control the bleeding which initially was unsuccessful, patient had to be intubated for interventional procedure with embolization of the left sphenopalatine artery on 07/18/2021.  She was successfully extubated on 07/19/2021.  She was given Kcentra on 07/18/2021.    Subjective   Patient seen and examined, no new complaints.   Assessment/Plan:   Acute epistaxis of the left nare -S/p embolization of the left sphenopalatine artery per IR -Insetting of Xarelto, she is not a candidate for anticoagulation -We will follow-up with the ENT and ENT will dictate when to start anticoagulation -She received 1 unit of PRBC, hemoglobin has been stable -Awaiting bed at skilled nursing facility  Acute respiratory failure with hypoxemia -Intubated for airway protection -Now on face tent with FiO2 of 10% -Wean off oxygen as tolerated  History of transverse myelitis -Patient has incomplete paraplegia -She had declining physical strength recently -She will need skilled nursing facility -PT following  History of DVT -Xarelto on hold due to above -She has had 2 episodes of DVT in the past and reported benefit from restarting Xarelto in the future  Delirium -She was somnolent yesterday, much more clear today -Avoid narcotics and  benzodiazepines         Medications     acyclovir  400 mg Oral BID   Chlorhexidine Gluconate Cloth  6 each Topical Q0600   diazepam  5 mg Oral BID   DULoxetine  30 mg Oral q AM   DULoxetine  60 mg Oral QHS   levETIRAcetam  250 mg Oral BID   mirabegron ER  50 mg Oral QHS   polyethylene glycol  17 g Oral Daily   predniSONE  5 mg Oral Q breakfast   pregabalin  100 mg Oral QHS   QUEtiapine  100 mg Oral QHS   senna-docusate  2 tablet Oral QHS     Data Reviewed:   CBG:  No results for input(s): GLUCAP in the last 168 hours.  SpO2: 91 % O2 Flow Rate (L/min): 2 L/min FiO2 (%): 28 %    Vitals:   07/26/21 1140 07/26/21 1513 07/26/21 1602 07/26/21 1853  BP: 104/66  113/75 127/67  Pulse: (!) 107 (!) 112 (!) 108 (!) 106  Resp: 17 (!) 28 (!) 25 (!) 30  Temp: 98.1 F (36.7 C)  97.9 F (36.6 C) 97.6 F (36.4 C)  TempSrc: Oral  Oral Oral  SpO2: 95% 94% 95% 91%  Weight:      Height:         Intake/Output Summary (Last 24 hours) at 07/26/2021 2004 Last data filed at 07/26/2021 1700 Gross per 24 hour  Intake --  Output 1250 ml  Net -1250 ml    11/22 0701 - 11/23 1900 In: 536 [P.O.:536] Out: 1497 [Urine:1650]  Filed Weights   07/24/21 0403 07/25/21 0500 07/26/21 0438  Weight: 78.2 kg 78.8 kg 78.2 kg  Data Reviewed: Basic Metabolic Panel: Recent Labs  Lab 07/20/21 0306 07/21/21 0723 07/26/21 0933  NA 140 138 138  K 3.7 3.5 3.6  CL 108 108 102  CO2 26 26 30   GLUCOSE 115* 109* 105*  BUN 15 8 12   CREATININE 0.67 0.68 0.81  CALCIUM 8.1* 8.0* 8.6*  MG  --  1.7  --    Liver Function Tests: Recent Labs  Lab 07/26/21 0933  AST 23  ALT 37  ALKPHOS 97  BILITOT 0.3  PROT 5.6*  ALBUMIN 2.6*   No results for input(s): LIPASE, AMYLASE in the last 168 hours. No results for input(s): AMMONIA in the last 168 hours. CBC: Recent Labs  Lab 07/20/21 0306 07/20/21 1900 07/21/21 0723 07/21/21 1801 07/23/21 0347 07/23/21 2039 07/26/21 0933  WBC 13.7*  14.1* 10.6* 11.7* 10.7*  --  10.9*  NEUTROABS 7.4  --   --   --  6.3  --   --   HGB 8.0* 7.8* 7.2* 9.8* 7.6* 9.8* 10.3*  HCT 25.7* 25.7* 23.4* 29.6* 24.3* 30.1* 33.4*  MCV 98.5 100.8* 100.0 95.5 99.2  --  99.1  PLT 212 210 188 199 145*  --  177   Cardiac Enzymes: No results for input(s): CKTOTAL, CKMB, CKMBINDEX, TROPONINI in the last 168 hours. BNP (last 3 results) No results for input(s): BNP in the last 8760 hours.  ProBNP (last 3 results) No results for input(s): PROBNP in the last 8760 hours.  CBG: No results for input(s): GLUCAP in the last 168 hours.     Radiology Reports  No results found.     Antibiotics: Anti-infectives (From admission, onward)    Start     Dose/Rate Route Frequency Ordered Stop   07/21/21 1000  acyclovir (ZOVIRAX) tablet 400 mg        400 mg Oral 2 times daily 07/21/21 0124     07/19/21 2200  acyclovir (ZOVIRAX) 200 MG/5ML suspension SUSP 400 mg  Status:  Discontinued        400 mg Oral 2 times daily 07/19/21 1547 07/21/21 0123   07/18/21 1115  acyclovir (ZOVIRAX) 200 MG/5ML suspension SUSP 400 mg  Status:  Discontinued        400 mg Per Tube 2 times daily 07/18/21 1016 07/19/21 1547   07/18/21 0137  ceFAZolin (ANCEF) 2-4 GM/100ML-% IVPB       Note to Pharmacy: Ubaldo Glassing   : cabinet override      07/18/21 0137 07/18/21 1344   07/15/21 0000  acyclovir (ZOVIRAX) tablet 400 mg  Status:  Discontinued        400 mg Oral 2 times daily 07/14/21 2351 07/18/21 1016   07/14/21 2200  meropenem (MERREM) 1 g in sodium chloride 0.9 % 100 mL IVPB  Status:  Discontinued        1 g 200 mL/hr over 30 Minutes Intravenous Every 8 hours 07/14/21 2143 07/15/21 1251         DVT prophylaxis: SCDs  Code Status: Full code  Family Communication: No family at bedside   Consultants: ENT IR  Procedures: Embolization of left sphenopalatine artery    Objective    Physical Examination:   General-appears in no acute distress Heart-S1-S2,  regular, no murmur auscultated Lungs-clear to auscultation bilaterally, no wheezing or crackles auscultated Abdomen-soft, nontender, no organomegaly Extremities-no edema in the lower extremities Neuro-alert, oriented x3, no focal deficit noted  Status is: Inpatient  Dispo: The patient is from: Home  Anticipated d/c is to: Skilled nursing facility              Anticipated d/c date is: 07/28/2021              Patient currently stable for discharge  Barrier to discharge-awaiting bed at skilled nursing facility  COVID-19 Labs  No results for input(s): DDIMER, FERRITIN, LDH, CRP in the last 72 hours.  Lab Results  Component Value Date   SARSCOV2NAA NEGATIVE 07/14/2021   [place] NEGATIVE 03/15/2021   [place] NEGATIVE 02/12/2020   [place] NEGATIVE 02/06/2020     Pressure Injury 02/12/20 Heel Right Deep Tissue Pressure Injury - Purple or maroon localized area of discolored intact skin or blood-filled blister due to damage of underlying soft tissue from pressure and/or shear. (Active)  02/12/20 0945  Location: Heel  Location Orientation: Right  Staging: Deep Tissue Pressure Injury - Purple or maroon localized area of discolored intact skin or blood-filled blister due to damage of underlying soft tissue from pressure and/or shear.  Wound Description (Comments):   Present on Admission: Yes        No results found for this or any previous visit (from the past 240 hour(s)).  Oswald Hillock   [facility] If 7PM-7AM, please contact night-coverage at [email or web address], Office  [phone]   07/26/2021, 8:04 PM  LOS: 11 days

## 2021-07-27 ENCOUNTER — Inpatient Hospital Stay (HOSPITAL_COMMUNITY): Payer: PPO

## 2021-07-27 DIAGNOSIS — R531 Weakness: Secondary | ICD-10-CM | POA: Diagnosis not present

## 2021-07-27 DIAGNOSIS — J9601 Acute respiratory failure with hypoxia: Secondary | ICD-10-CM | POA: Diagnosis not present

## 2021-07-27 DIAGNOSIS — R6520 Severe sepsis without septic shock: Secondary | ICD-10-CM | POA: Diagnosis not present

## 2021-07-27 DIAGNOSIS — A419 Sepsis, unspecified organism: Secondary | ICD-10-CM

## 2021-07-27 DIAGNOSIS — G8222 Paraplegia, incomplete: Secondary | ICD-10-CM | POA: Diagnosis not present

## 2021-07-27 DIAGNOSIS — R040 Epistaxis: Secondary | ICD-10-CM | POA: Diagnosis not present

## 2021-07-27 LAB — CBC WITH DIFFERENTIAL/PLATELET
Abs Immature Granulocytes: 0.1 10*3/uL — ABNORMAL HIGH (ref 0.00–0.07)
Basophils Absolute: 0.1 10*3/uL (ref 0.0–0.1)
Basophils Relative: 0 %
Eosinophils Absolute: 0.4 10*3/uL (ref 0.0–0.5)
Eosinophils Relative: 3 %
HCT: 33.7 % — ABNORMAL LOW (ref 36.0–46.0)
Hemoglobin: 10.9 g/dL — ABNORMAL LOW (ref 12.0–15.0)
Immature Granulocytes: 1 %
Lymphocytes Relative: 26 %
Lymphs Abs: 3.4 10*3/uL (ref 0.7–4.0)
MCH: 31.2 pg (ref 26.0–34.0)
MCHC: 32.3 g/dL (ref 30.0–36.0)
MCV: 96.6 fL (ref 80.0–100.0)
Monocytes Absolute: 1.1 10*3/uL — ABNORMAL HIGH (ref 0.1–1.0)
Monocytes Relative: 9 %
Neutro Abs: 7.8 10*3/uL — ABNORMAL HIGH (ref 1.7–7.7)
Neutrophils Relative %: 61 %
Platelets: 191 10*3/uL (ref 150–400)
RBC: 3.49 MIL/uL — ABNORMAL LOW (ref 3.87–5.11)
RDW: 15.3 % (ref 11.5–15.5)
WBC: 12.9 10*3/uL — ABNORMAL HIGH (ref 4.0–10.5)
nRBC: 0 % (ref 0.0–0.2)

## 2021-07-27 LAB — APTT: aPTT: 27 seconds (ref 24–36)

## 2021-07-27 LAB — COMPREHENSIVE METABOLIC PANEL
ALT: 35 U/L (ref 0–44)
AST: 25 U/L (ref 15–41)
Albumin: 2.8 g/dL — ABNORMAL LOW (ref 3.5–5.0)
Alkaline Phosphatase: 104 U/L (ref 38–126)
Anion gap: 8 (ref 5–15)
BUN: 13 mg/dL (ref 8–23)
CO2: 27 mmol/L (ref 22–32)
Calcium: 8.7 mg/dL — ABNORMAL LOW (ref 8.9–10.3)
Chloride: 102 mmol/L (ref 98–111)
Creatinine, Ser: 0.77 mg/dL (ref 0.44–1.00)
GFR, Estimated: >60 mL/min (ref >60)
Glucose, Bld: 107 mg/dL — ABNORMAL HIGH (ref 70–99)
Potassium: 3.8 mmol/L (ref 3.5–5.1)
Sodium: 137 mmol/L (ref 135–145)
Total Bilirubin: 0.7 mg/dL (ref 0.3–1.2)
Total Protein: 6 g/dL — ABNORMAL LOW (ref 6.5–8.1)

## 2021-07-27 LAB — PROTIME-INR
INR: 1.1 (ref 0.8–1.2)
Prothrombin Time: 14.2 seconds (ref 11.4–15.2)

## 2021-07-27 LAB — CORTISOL: Cortisol, Plasma: 6.2 ug/dL

## 2021-07-27 LAB — LACTIC ACID, PLASMA: Lactic Acid, Venous: 1.3 mmol/L (ref 0.5–1.9)

## 2021-07-27 LAB — PROCALCITONIN: Procalcitonin: 19.08 ng/mL

## 2021-07-27 MED ORDER — SODIUM CHLORIDE 0.9 % IV SOLN
2.0000 g | Freq: Three times a day (TID) | INTRAVENOUS | Status: DC
  Administered 2021-07-27 – 2021-07-31 (×11): 2 g via INTRAVENOUS
  Filled 2021-07-27 (×15): qty 2

## 2021-07-27 MED ORDER — HYDROCORTISONE SOD SUC (PF) 100 MG IJ SOLR
100.0000 mg | Freq: Three times a day (TID) | INTRAMUSCULAR | Status: DC
  Administered 2021-07-27 – 2021-07-31 (×11): 100 mg via INTRAVENOUS
  Filled 2021-07-27 (×12): qty 2

## 2021-07-27 MED ORDER — LACTATED RINGERS IV BOLUS (SEPSIS)
500.0000 mL | Freq: Once | INTRAVENOUS | Status: AC
  Administered 2021-07-27: 500 mL via INTRAVENOUS

## 2021-07-27 MED ORDER — VANCOMYCIN HCL 1500 MG/300ML IV SOLN
1500.0000 mg | INTRAVENOUS | Status: DC
  Administered 2021-07-28 – 2021-07-29 (×2): 1500 mg via INTRAVENOUS
  Filled 2021-07-27 (×2): qty 300

## 2021-07-27 MED ORDER — VANCOMYCIN HCL 1000 MG/200ML IV SOLN: 1000.0000 mg | Freq: Once | INTRAVENOUS | Status: DC

## 2021-07-27 MED ORDER — LACTATED RINGERS IV SOLN: INTRAVENOUS | Status: AC

## 2021-07-27 MED ORDER — SODIUM CHLORIDE 0.9 % IV SOLN
1.0000 g | Freq: Once | INTRAVENOUS | Status: AC
  Administered 2021-07-27: 1 g via INTRAVENOUS
  Filled 2021-07-27: qty 1

## 2021-07-27 MED ORDER — VANCOMYCIN HCL 1750 MG/350ML IV SOLN
1750.0000 mg | Freq: Once | INTRAVENOUS | Status: AC
  Administered 2021-07-27: 1750 mg via INTRAVENOUS
  Filled 2021-07-27: qty 350

## 2021-07-27 NOTE — Progress Notes (Incomplete)
°   07/27/21 0734  Assess: MEWS Score  Temp (!) 101.1 F (38.4 C)  BP (!) 91/57  Pulse Rate (!) 120  ECG Heart Rate (!) 120  Resp (!) 34  Level of Consciousness Alert  SpO2 90 %  O2 Device Face Tent  O2 Flow Rate (L/min) 10 L/min  FiO2 (%) 35 %  Assess: MEWS Score  MEWS Temp 1  MEWS Systolic 1  MEWS Pulse 2  MEWS RR 2  MEWS LOC 0  MEWS Score 6  MEWS Score Color Red  Assess: if the MEWS score is Yellow or Red  Were vital signs taken at a resting state? Yes  Focused Assessment Change from prior assessment (see assessment flowsheet)  Early Detection of Sepsis Score *See Row Information* Low  MEWS guidelines implemented *See Row Information* Yes  Treat  MEWS Interventions Administered prn meds/treatments;Escalated (See documentation below)  Take Vital Signs  Increase Vital Sign Frequency  Red: Q 1hr X 4 then Q 4hr X 4, if remains red, continue Q 4hrs  Escalate  MEWS: Escalate Red: discuss with charge nurse/RN and provider, consider discussing with RRT  Notify: Charge Nurse/RN  Name of Charge Nurse/RN Notified [employer] RN  Date Charge Nurse/RN Notified 07/27/21  Time Charge Nurse/RN Notified 1610  Notify: Provider  Provider Name/Title Georgiann Mohs MD  Date Provider Notified 07/27/21  Time Provider Notified 0800  Notification Type Page  Notification Reason Other (Comment) (red MEWS)  Provider response See new orders;At bedside  Date of Provider Response 07/27/21  Time of Provider Response [phone]  Document  Patient Outcome Other (Comment) (labs ordered, will recheck VS)  Progress note created (see row info) Yes

## 2021-07-27 NOTE — Progress Notes (Signed)
[facility]  PROGRESS NOTE  Katelyn Lamb TML:[id number] DOB: Aug 16, 1946 DOA: 07/14/2021 PCP: [name], MD   Brief HPI:   75 year old female with a history of encephalomyelitis and transverse colitis HSV 5 years ago prior to admission, neurogenic bladder with suprapubic catheter, incomplete paraplegia, recurrent UTIs was admitted on 07/14/2021 due to weakness and unable to ambulate due to UTI.  Was started on meropenem.  ID was consulted on 07/15/2021 and antibiotics were held as it was thought to be due to colonization.  Unfortunately she developed epistaxis from left nare, Rhino Rocket was placed that was unsuccessful.  ENT was consulted to control the bleeding which initially was unsuccessful, patient had to be intubated for interventional procedure with embolization of the left sphenopalatine artery on 07/18/2021.  She was successfully extubated on 07/19/2021.  She was given Kcentra on 07/18/2021.    Subjective   This morning patient developed fever with temperature 101.  She was requiring 2 L/min of oxygen via face tent.  Sepsis protocol was initiated, patient started on vancomycin and meropenem, also started on LR bolus 500 cc x 1, chest x-ray was unremarkable.  Blood cultures x2 pending.   Assessment/Plan:    Sepsis -Patient was tachycardic, febrile with temperature 101, hypotensive, also requiring 10 L/min of oxygen via face tent -Sepsis work-up was initiated, lactic acid was 1.3, procalcitonin 19.0 -Patient responded to 500 cc LR bolus x1 -Blood cultures x2, urine culture obtained -Also started on vancomycin and meropenem -Chest x-ray was unremarkable -Follow blood and urine culture results  Acute epistaxis of the left nare in the setting of anticoagulation with Xarelto -S/p embolization of the left sphenopalatine artery per IR -she has been on Xarelto ,she is not a candidate for anticoagulation -We will follow-up with the ENT and ENT will dictate when to start  anticoagulation -She received 1 unit of PRBC, hemoglobin has been stable -Awaiting bed at skilled nursing facility  Acute respiratory failure with hypoxemia -Intubated for airway protection -Now on face tent with FiO2 of 10% -Wean off oxygen as tolerated  History of transverse myelitis -Patient has incomplete paraplegia -She had declining physical strength recently -She will need skilled nursing facility -PT following  History of DVT -Xarelto on hold due to above -She has had 2 episodes of DVT in the past and reported benefit from restarting Xarelto in the future  Delirium -She was somnolent yesterday, much more clear today -Avoid narcotics and benzodiazepines         Medications     acyclovir  400 mg Oral BID   Chlorhexidine Gluconate Cloth  6 each Topical Q0600   diazepam  5 mg Oral BID   DULoxetine  30 mg Oral q AM   DULoxetine  60 mg Oral QHS   hydrocortisone sod succinate (SOLU-CORTEF) inj  100 mg Intravenous Q8H   levETIRAcetam  250 mg Oral BID   mirabegron ER  50 mg Oral QHS   polyethylene glycol  17 g Oral Daily   pregabalin  100 mg Oral QHS   QUEtiapine  100 mg Oral QHS   senna-docusate  2 tablet Oral QHS     Data Reviewed:   CBG:  No results for input(s): GLUCAP in the last 168 hours.  SpO2: 91 % O2 Flow Rate (L/min): 5 L/min FiO2 (%): 28 %    Vitals:   07/27/21 1130 07/27/21 1458 07/27/21 1538 07/27/21 1612  BP: 98/77 114/66 101/72   Pulse: 96 93 94 93  Resp: 19 20 19  19  Temp: 98.2 F (36.8 C) (!) 97.4 F (36.3 C)    TempSrc:  Axillary    SpO2: 94% 92% 91% 91%  Weight:      Height:         Intake/Output Summary (Last 24 hours) at 07/27/2021 1623 Last data filed at 07/27/2021 1506 Gross per 24 hour  Intake 450 ml  Output 3800 ml  Net -3350 ml    11/22 1901 - 11/24 0700 In: -  Out: 1250 [Urine:1250]  Filed Weights   07/25/21 0500 07/26/21 0438 07/27/21 0537  Weight: 78.8 kg 78.2 kg 79.3 kg    Data Reviewed: Basic  Metabolic Panel: Recent Labs  Lab 07/21/21 0723 07/26/21 0933 07/27/21 0856  NA 138 138 137  K 3.5 3.6 3.8  CL 108 102 102  CO2 26 30 27   GLUCOSE 109* 105* 107*  BUN 8 12 13   CREATININE 0.68 0.81 0.77  CALCIUM 8.0* 8.6* 8.7*  MG 1.7  --   --    Liver Function Tests: Recent Labs  Lab 07/26/21 0933 07/27/21 0856  AST 23 25  ALT 37 35  ALKPHOS 97 104  BILITOT 0.3 0.7  PROT 5.6* 6.0*  ALBUMIN 2.6* 2.8*   No results for input(s): LIPASE, AMYLASE in the last 168 hours. No results for input(s): AMMONIA in the last 168 hours. CBC: Recent Labs  Lab 07/21/21 0723 07/21/21 1801 07/23/21 0347 07/23/21 2039 07/26/21 0933 07/27/21 0856  WBC 10.6* 11.7* 10.7*  --  10.9* 12.9*  NEUTROABS  --   --  6.3  --   --  7.8*  HGB 7.2* 9.8* 7.6* 9.8* 10.3* 10.9*  HCT 23.4* 29.6* 24.3* 30.1* 33.4* 33.7*  MCV 100.0 95.5 99.2  --  99.1 96.6  PLT 188 199 145*  --  177 191   Cardiac Enzymes: No results for input(s): CKTOTAL, CKMB, CKMBINDEX, TROPONINI in the last 168 hours. BNP (last 3 results) No results for input(s): BNP in the last 8760 hours.  ProBNP (last 3 results) No results for input(s): PROBNP in the last 8760 hours.  CBG: No results for input(s): GLUCAP in the last 168 hours.     Radiology Reports  DG Chest Port 1 View  Result Date: 07/27/2021 CLINICAL DATA:  Shortness of breath. EXAM: PORTABLE CHEST 1 VIEW COMPARISON:  Chest x-ray dated July 22, 2021. FINDINGS: The patient is rotated to the left. The heart size and mediastinal contours are within normal limits. Normal pulmonary vascularity. No focal consolidation, pleural effusion, or pneumothorax. No acute osseous abnormality. IMPRESSION: No active disease. Electronically Signed   By: Titus Dubin M.D.   On: 07/27/2021 11:21       Antibiotics: Anti-infectives (From admission, onward)    Start     Dose/Rate Route Frequency Ordered Stop   07/28/21 1000  vancomycin (VANCOREADY) IVPB 1500 mg/300 mL         1,500 mg 150 mL/hr over 120 Minutes Intravenous Every 24 hours 07/27/21 0849     07/27/21 1400  meropenem (MERREM) 2 g in sodium chloride 0.9 % 100 mL IVPB        2 g 200 mL/hr over 30 Minutes Intravenous Every 8 hours 07/27/21 0849     07/27/21 0930  vancomycin (VANCOREADY) IVPB 1750 mg/350 mL        1,750 mg 175 mL/hr over 120 Minutes Intravenous  Once 07/27/21 0841 07/27/21 1242   07/27/21 0915  vancomycin (VANCOREADY) IVPB 1000 mg/200 mL  Status:  Discontinued  1,000 mg 200 mL/hr over 60 Minutes Intravenous  Once 07/27/21 0818 07/27/21 0840   07/27/21 0915  meropenem (MERREM) 1 g in sodium chloride 0.9 % 100 mL IVPB        1 g 200 mL/hr over 30 Minutes Intravenous  Once 07/27/21 0818 07/27/21 1024   07/21/21 1000  acyclovir (ZOVIRAX) tablet 400 mg        400 mg Oral 2 times daily 07/21/21 0124     07/19/21 2200  acyclovir (ZOVIRAX) 200 MG/5ML suspension SUSP 400 mg  Status:  Discontinued        400 mg Oral 2 times daily 07/19/21 1547 07/21/21 0123   07/18/21 1115  acyclovir (ZOVIRAX) 200 MG/5ML suspension SUSP 400 mg  Status:  Discontinued        400 mg Per Tube 2 times daily 07/18/21 1016 07/19/21 1547   07/18/21 0137  ceFAZolin (ANCEF) 2-4 GM/100ML-% IVPB       Note to Pharmacy: Ubaldo Glassing   : cabinet override      07/18/21 0137 07/18/21 1344   07/15/21 0000  acyclovir (ZOVIRAX) tablet 400 mg  Status:  Discontinued        400 mg Oral 2 times daily 07/14/21 2351 07/18/21 1016   07/14/21 2200  meropenem (MERREM) 1 g in sodium chloride 0.9 % 100 mL IVPB  Status:  Discontinued        1 g 200 mL/hr over 30 Minutes Intravenous Every 8 hours 07/14/21 2143 07/15/21 1251         DVT prophylaxis: SCDs  Code Status: Full code  Family Communication: No family at bedside   Consultants: ENT IR  Procedures: Embolization of left sphenopalatine artery    Objective    Physical Examination:  General-appears in no acute distress Heart-S1-S2, regular, no murmur  auscultated Lungs-clear to auscultation bilaterally, no wheezing or crackles auscultated Abdomen-soft, nontender, no organomegaly Extremities-no edema in the lower extremities Neuro-alert, oriented x3, no focal deficit noted  Status is: Inpatient  Dispo: The patient is from: Home              Anticipated d/c is to: Skilled nursing facility              Anticipated d/c date is: 11/28/ the last #1 #2 2022              Patient currently stable for discharge  Barrier to discharge-awaiting bed at skilled nursing facility  COVID-19 Labs  No results for input(s): DDIMER, FERRITIN, LDH, CRP in the last 72 hours.  Lab Results  Component Value Date   SARSCOV2NAA NEGATIVE 07/14/2021   [place] NEGATIVE 03/15/2021   [place] NEGATIVE 02/12/2020   [place] NEGATIVE 02/06/2020     Pressure Injury 02/12/20 Heel Right Deep Tissue Pressure Injury - Purple or maroon localized area of discolored intact skin or blood-filled blister due to damage of underlying soft tissue from pressure and/or shear. (Active)  02/12/20 0945  Location: Heel  Location Orientation: Right  Staging: Deep Tissue Pressure Injury - Purple or maroon localized area of discolored intact skin or blood-filled blister due to damage of underlying soft tissue from pressure and/or shear.  Wound Description (Comments):   Present on Admission: Yes        Recent Results (from the past 240 hour(s))  Culture, blood (x 2)     Status: None (Preliminary result)   Collection Time: 07/27/21  9:15 AM   Specimen: BLOOD  Result Value Ref Range Status   Specimen  Description BLOOD RIGHT ANTECUBITAL  Final   Special Requests   Final    BOTTLES DRAWN AEROBIC AND ANAEROBIC Blood Culture results may not be optimal due to an excessive volume of blood received in culture bottles   Culture   Final    NO GROWTH < 12 HOURS Performed at [facility] [address], [place], [place] [address]    Report Status PENDING  Incomplete   Culture, blood (x 2)     Status: None (Preliminary result)   Collection Time: 07/27/21  9:16 AM   Specimen: BLOOD RIGHT HAND  Result Value Ref Range Status   Specimen Description BLOOD RIGHT HAND  Final   Special Requests   Final    BOTTLES DRAWN AEROBIC AND ANAEROBIC Blood Culture results may not be optimal due to an excessive volume of blood received in culture bottles   Culture   Final    NO GROWTH < 12 HOURS Performed at [facility], [place] [address] [place], [place] [address]    Report Status PENDING  Incomplete    Oswald Hillock   [facility] If 7PM-7AM, please contact night-coverage at [email or web address], Office  [phone]   07/27/2021, 4:23 PM  LOS: 12 days

## 2021-07-27 NOTE — Progress Notes (Signed)
Pharmacy Antibiotic Note  Katelyn Lamb is a 75 y.o. female admitted on 07/14/2021 with sepsis.  Pharmacy has been consulted for vancomycin/meropenem dosing.  Patient has been admitted since 07/14/21 with nosebleed due to her Xarelto requiring reversal with Kcentra. Spiked new fevers in the 101F and increasing oxygen requirement.   She will go for a stat chest X-ray.  Plan: Vancomycin 1750 mg x1 then 1500 mg IV q 24h  (est AUC 483.6) Meropenem 1g IV once then 2g IV q8h  Height: 5\' 7"  (170.2 cm) Weight: 79.3 kg (174 lb 13.2 oz) IBW/kg (Calculated) : 61.6  Temp (24hrs), Avg:98.7 F (37.1 C), Min:97.6 F (36.4 C), Max:101.1 F (38.4 C)  Recent Labs  Lab 07/20/21 1900 07/21/21 0723 07/21/21 1801 07/23/21 0347 07/26/21 0933  WBC 14.1* 10.6* 11.7* 10.7* 10.9*  CREATININE  --  0.68  --   --  0.81    Estimated Creatinine Clearance: 67.1 mL/min (by C-G formula based on SCr of 0.81 mg/dL).    Allergies  Allergen Reactions   Demerol [Meperidine] Other (See Comments)    Hallucinations   Percocet [Oxycodone-Acetaminophen] Itching   Amoxicillin-Pot Clavulanate Diarrhea    Severe pain, headache, intestinal infection   Penicillins Itching and Rash    Has patient had a PCN reaction causing immediate rash, facial/tongue/throat swelling, SOB or lightheadedness with hypotension:  NO Has patient had a PCN reaction causing severe rash involving mucus membranes or skin necrosis: No Has patient had a PCN reaction that required hospitalization: No Has patient had a PCN reaction occurring within the last 10 years: Yes If all of the above answers are "NO", then may proceed with Cephalosporin use.    Antimicrobials this admission: Merrem x2 doses in ED (11/11 ans 11/12) Acyclovir PTA cont.  11/24 vancomycin 1750 mg x1 then 1500 mg q24h>> 11/24 meropenem 1g x1 then 2g q8h >>   Microbiology results: 11/24 UCx- pending 11/24 Bcx- pending 11/11 Ucx- multiple species present, recollection  suggested  Thank you for allowing pharmacy to be a part of this patient's care.  Eduard Clos Athleen Feltner 07/27/2021 8:36 AM

## 2021-07-27 NOTE — Sepsis Progress Note (Signed)
ELink monitoring sepsis protocol 

## 2021-07-27 NOTE — Progress Notes (Addendum)
Subjective: Called by RN that patient developed fever of 101 this morning, oxygen requirement went up to 10 L/min.  O2 sats maintaining up to 90%. Patient seen and examined, she is alert, oriented x3, following commands. She was started on antibiotics initially when she came to ED on 07/14/2021, at that time it was felt that she may have UTI.  However after consulting with ID IV antibiotics were discontinued.  Urine culture also grew multiple species at that time.   Vitals:   07/27/21 0311 07/27/21 0734  BP: 108/71 (!) 91/57  Pulse: (!) 116 (!) 120  Resp: (!) 30 (!) 34  Temp: 97.8 F (36.6 C) (!) 101.1 F (38.4 C)  SpO2: (!) 88% 90%      A/P Sepsis/acute hypoxemic respiratory failure -Unclear source -Requiring 10 L/min oxygen by face tent, FiO2 40% -We will initiate sepsis work-up -LR 500 bolus x1, obtain blood cultures x2, urine culture, CBC, CMP, lactic acid, procalcitonin -Blood pressure is stable, so will not initiate 30 cc/kg fluid bolus -We will initiate vancomycin, meropenem per pharmacy consultation -We will obtain stat chest x-ray    Called and updated patient's husband on phone    [place] [facility] Pager- [phone]

## 2021-07-27 NOTE — TOC Progression Note (Signed)
Transition of Care [facility]) - Progression Note    Patient Details  Name: Katelyn Lamb MRN: [id number] Date of Birth: [birth year]  Transition of Care [facility]) CM/SW [place], [place] Phone Number: 07/27/2021, 12:53 PM  Clinical Narrative:     07/27/2021-[employer] faxed copy of denial 4:47pm CSW received copy of denial notice today - CSW informed patient's spouse and gave faxed copy of denial.   Patient's spouse inquired about finding facilities - advised no return calls(from SNFs mainly because of holiday) and at this current time, patient has been denied authorization for short term rehab at [facility]. He expressed "what are we to do now", CSW acknowledge his concern and suggested we refocus on disposition once she has medically improved.   TOC will continue to follow and assist with discharge planning.  Thurmond Butts, MSW, LCSW Clinical Social Worker    Expected Discharge Plan: Skilled Nursing Facility Barriers to Discharge: Continued Medical Work up  Expected Discharge Plan and Services Expected Discharge Plan: [place]   Discharge Planning Services: CM Consult Post Acute Care Choice: [place] Living arrangements for the past 2 months: Single Family Home                                       Social Determinants of Health (SDOH) Interventions    Readmission Risk Interventions No flowsheet data found.

## 2021-07-28 DIAGNOSIS — A419 Sepsis, unspecified organism: Secondary | ICD-10-CM | POA: Diagnosis not present

## 2021-07-28 DIAGNOSIS — R531 Weakness: Secondary | ICD-10-CM | POA: Diagnosis not present

## 2021-07-28 DIAGNOSIS — G8222 Paraplegia, incomplete: Secondary | ICD-10-CM | POA: Diagnosis not present

## 2021-07-28 DIAGNOSIS — R040 Epistaxis: Secondary | ICD-10-CM | POA: Diagnosis not present

## 2021-07-28 LAB — CBC
HCT: 31.3 % — ABNORMAL LOW (ref 36.0–46.0)
Hemoglobin: 9.8 g/dL — ABNORMAL LOW (ref 12.0–15.0)
MCH: 30.5 pg (ref 26.0–34.0)
MCHC: 31.3 g/dL (ref 30.0–36.0)
MCV: 97.5 fL (ref 80.0–100.0)
Platelets: 191 10*3/uL (ref 150–400)
RBC: 3.21 MIL/uL — ABNORMAL LOW (ref 3.87–5.11)
RDW: 14.7 % (ref 11.5–15.5)
WBC: 12.1 10*3/uL — ABNORMAL HIGH (ref 4.0–10.5)
nRBC: 0 % (ref 0.0–0.2)

## 2021-07-28 LAB — COMPREHENSIVE METABOLIC PANEL
ALT: 35 U/L (ref 0–44)
AST: 22 U/L (ref 15–41)
Albumin: 2.5 g/dL — ABNORMAL LOW (ref 3.5–5.0)
Alkaline Phosphatase: 89 U/L (ref 38–126)
Anion gap: 7 (ref 5–15)
BUN: 15 mg/dL (ref 8–23)
CO2: 27 mmol/L (ref 22–32)
Calcium: 8.7 mg/dL — ABNORMAL LOW (ref 8.9–10.3)
Chloride: 105 mmol/L (ref 98–111)
Creatinine, Ser: 0.65 mg/dL (ref 0.44–1.00)
GFR, Estimated: >60 mL/min (ref >60)
Glucose, Bld: 138 mg/dL — ABNORMAL HIGH (ref 70–99)
Potassium: 3.6 mmol/L (ref 3.5–5.1)
Sodium: 139 mmol/L (ref 135–145)
Total Bilirubin: 0.4 mg/dL (ref 0.3–1.2)
Total Protein: 5.6 g/dL — ABNORMAL LOW (ref 6.5–8.1)

## 2021-07-28 LAB — PROCALCITONIN: Procalcitonin: <0.1 ng/mL

## 2021-07-28 MED ORDER — LACTATED RINGERS IV SOLN: INTRAVENOUS | Status: AC

## 2021-07-28 MED ORDER — WHITE PETROLATUM EX OINT
TOPICAL_OINTMENT | CUTANEOUS | Status: AC
  Filled 2021-07-28: qty 28.35

## 2021-07-28 NOTE — Progress Notes (Signed)
Manufacturing engineer [facility]) Hospital Liaison: RN note    Lonia Chimera was contacted by patient's husband this morning to inquire about hospice services at home after discharge. Liaison spoke with husband to explain services and answer questions.  Liaison notified care team of family request.   [place] Liaison will continue to follow. If decision is made to go home with hospice, liaison will coordinate with hospital for needs after discharge.    A Please do not hesitate to call with questions.    Thank you,   Farrel Gordon, RN, [place] [facility] Liaison   [phone]

## 2021-07-28 NOTE — Progress Notes (Signed)
[facility]  PROGRESS NOTE  Katelyn Lamb FWY:[id number] DOB: [birth year] DOA: 07/14/2021 PCP: [name], MD   Brief HPI:   75 year old female with a history of encephalomyelitis and transverse colitis HSV 5 years ago prior to admission, neurogenic bladder with suprapubic catheter, incomplete paraplegia, recurrent UTIs was admitted on 07/14/2021 due to weakness and unable to ambulate due to UTI.  Was started on meropenem.  ID was consulted on 07/15/2021 and antibiotics were held as it was thought to be due to colonization.  Unfortunately she developed epistaxis from left nare, Rhino Rocket was placed that was unsuccessful.  ENT was consulted to control the bleeding which initially was unsuccessful, patient had to be intubated for interventional procedure with embolization of the left sphenopalatine artery on 07/18/2021.  She was successfully extubated on 07/19/2021.  She was given Kcentra on 07/18/2021.    Subjective   Patient seen and examined, somnolent this morning.  Arousable to verbal stimuli and then falls back to sleep.   Assessment/Plan:    Sepsis -Patient was tachycardic, febrile with temperature 101, hypotensive, also requiring 10 L/min of oxygen via face tent on 07/27/2021 -Sepsis work-up was initiated, lactic acid was 1.3, procalcitonin 19.0; today repeat procalcitonin is less than 0.10 -Patient responded to 500 cc LR bolus x1 -Sepsis physiology has resolved -Blood cultures x2, urine culture obtained; no growth till date -Also started on vancomycin and meropenem -Patient takes prednisone 5 mg daily at home, prednisone was discontinued and patient started on Solu-Medrol 100 mg IV every 8 hours for stress dose steroids. -Chest x-ray was unremarkable -Follow blood and urine culture results  Acute epistaxis of the left nare in the setting of anticoagulation with Xarelto -S/p embolization of the left sphenopalatine artery per IR -she has been on Xarelto ,she is not a  candidate for anticoagulation -We will follow-up with the ENT and ENT will dictate when to start anticoagulation -She received 1 unit of PRBC, hemoglobin has been stable -Awaiting bed at skilled nursing facility  Acute respiratory failure with hypoxemia -Intubated for airway protection -Now on face tent with FiO2 of 10% -Wean off oxygen as tolerated  History of transverse myelitis -Patient has incomplete paraplegia -She had declining physical strength recently -She will need skilled nursing facility -PT following  History of DVT -Xarelto on hold due to above -She has had 2 episodes of DVT in the past and reported benefit from restarting Xarelto in the future  Delirium -She was somnolent yesterday, much more clear today -Avoid narcotics and benzodiazepines   Goals of care -Discussed with patient's husband on phone, he is considering hospice at home.  We will consult palliative care for further goals of care discussion and helping with disposition.      Medications     acyclovir  400 mg Oral BID   Chlorhexidine Gluconate Cloth  6 each Topical Q0600   diazepam  5 mg Oral BID   DULoxetine  30 mg Oral q AM   DULoxetine  60 mg Oral QHS   hydrocortisone sod succinate (SOLU-CORTEF) inj  100 mg Intravenous Q8H   levETIRAcetam  250 mg Oral BID   mirabegron ER  50 mg Oral QHS   polyethylene glycol  17 g Oral Daily   pregabalin  100 mg Oral QHS   QUEtiapine  100 mg Oral QHS   senna-docusate  2 tablet Oral QHS     Data Reviewed:   CBG:  No results for input(s): GLUCAP in the last 168 hours.  SpO2:  90 % O2 Flow Rate (L/min): 5 L/min FiO2 (%): 28 %    Vitals:   07/28/21 0346 07/28/21 0608 07/28/21 0759 07/28/21 0840  BP: (!) 74/48  100/76 (!) 88/61  Pulse: 97  93 90  Resp: 20  19 12   Temp: 97.7 F (36.5 C)  (!) 97.5 F (36.4 C)   TempSrc: Oral  Axillary   SpO2: 91%  94% 90%  Weight:  77.2 kg    Height:         Intake/Output Summary (Last 24 hours) at  07/28/2021 0910 Last data filed at 07/27/2021 1506 Gross per 24 hour  Intake 450 ml  Output 850 ml  Net -400 ml    11/23 1901 - 11/25 0700 In: 450  Out: 2550 [Urine:2550]  Filed Weights   07/26/21 0438 07/27/21 0537 07/28/21 0608  Weight: 78.2 kg 79.3 kg 77.2 kg    Data Reviewed: Basic Metabolic Panel: Recent Labs  Lab 07/26/21 0933 07/27/21 0856 07/28/21 0316  NA 138 137 139  K 3.6 3.8 3.6  CL 102 102 105  CO2 30 27 27   GLUCOSE 105* 107* 138*  BUN 12 13 15   CREATININE 0.81 0.77 0.65  CALCIUM 8.6* 8.7* 8.7*   Liver Function Tests: Recent Labs  Lab 07/26/21 0933 07/27/21 0856 07/28/21 0316  AST 23 25 22   ALT 37 35 35  ALKPHOS 97 104 89  BILITOT 0.3 0.7 0.4  PROT 5.6* 6.0* 5.6*  ALBUMIN 2.6* 2.8* 2.5*   No results for input(s): LIPASE, AMYLASE in the last 168 hours. No results for input(s): AMMONIA in the last 168 hours. CBC: Recent Labs  Lab 07/21/21 1801 07/23/21 0347 07/23/21 2039 07/26/21 0933 07/27/21 0856 07/28/21 0316  WBC 11.7* 10.7*  --  10.9* 12.9* 12.1*  NEUTROABS  --  6.3  --   --  7.8*  --   HGB 9.8* 7.6* 9.8* 10.3* 10.9* 9.8*  HCT 29.6* 24.3* 30.1* 33.4* 33.7* 31.3*  MCV 95.5 99.2  --  99.1 96.6 97.5  PLT 199 145*  --  177 191 191       Radiology Reports  DG Chest Port 1 View  Result Date: 07/27/2021 CLINICAL DATA:  Shortness of breath. EXAM: PORTABLE CHEST 1 VIEW COMPARISON:  Chest x-ray dated July 22, 2021. FINDINGS: The patient is rotated to the left. The heart size and mediastinal contours are within normal limits. Normal pulmonary vascularity. No focal consolidation, pleural effusion, or pneumothorax. No acute osseous abnormality. IMPRESSION: No active disease. Electronically Signed   By: Titus Dubin M.D.   On: 07/27/2021 11:21       Antibiotics: Anti-infectives (From admission, onward)    Start     Dose/Rate Route Frequency Ordered Stop   07/28/21 1000  vancomycin (VANCOREADY) IVPB 1500 mg/300 mL        1,500  mg 150 mL/hr over 120 Minutes Intravenous Every 24 hours 07/27/21 0849     07/27/21 1400  meropenem (MERREM) 2 g in sodium chloride 0.9 % 100 mL IVPB        2 g 200 mL/hr over 30 Minutes Intravenous Every 8 hours 07/27/21 0849     07/27/21 0930  vancomycin (VANCOREADY) IVPB 1750 mg/350 mL        1,750 mg 175 mL/hr over 120 Minutes Intravenous  Once 07/27/21 0841 07/27/21 1242   07/27/21 0915  vancomycin (VANCOREADY) IVPB 1000 mg/200 mL  Status:  Discontinued        1,000 mg 200 mL/hr over  60 Minutes Intravenous  Once 07/27/21 0818 07/27/21 0840   07/27/21 0915  meropenem (MERREM) 1 g in sodium chloride 0.9 % 100 mL IVPB        1 g 200 mL/hr over 30 Minutes Intravenous  Once 07/27/21 0818 07/27/21 1024   07/21/21 1000  acyclovir (ZOVIRAX) tablet 400 mg        400 mg Oral 2 times daily 07/21/21 0124     07/19/21 2200  acyclovir (ZOVIRAX) 200 MG/5ML suspension SUSP 400 mg  Status:  Discontinued        400 mg Oral 2 times daily 07/19/21 1547 07/21/21 0123   07/18/21 1115  acyclovir (ZOVIRAX) 200 MG/5ML suspension SUSP 400 mg  Status:  Discontinued        400 mg Per Tube 2 times daily 07/18/21 1016 07/19/21 1547   07/18/21 0137  ceFAZolin (ANCEF) 2-4 GM/100ML-% IVPB       Note to Pharmacy: Ubaldo Glassing   : cabinet override      07/18/21 0137 07/18/21 1344   07/15/21 0000  acyclovir (ZOVIRAX) tablet 400 mg  Status:  Discontinued        400 mg Oral 2 times daily 07/14/21 2351 07/18/21 1016   07/14/21 2200  meropenem (MERREM) 1 g in sodium chloride 0.9 % 100 mL IVPB  Status:  Discontinued        1 g 200 mL/hr over 30 Minutes Intravenous Every 8 hours 07/14/21 2143 07/15/21 1251         DVT prophylaxis: SCDs  Code Status: Full code  Family Communication: No family at bedside   Consultants: ENT IR  Procedures: Embolization of left sphenopalatine artery    Objective    Physical Examination:  General-appears in no acute distress Heart-S1-S2, regular, no murmur  auscultated Lungs-clear to auscultation bilaterally, no wheezing or crackles auscultated Abdomen-soft, nontender, no organomegaly Extremities-no edema in the lower extremities Neuro-alert, oriented x3, no focal deficit noted  Status is: Inpatient  Dispo: The patient is from: Home              Anticipated d/c is to: Skilled nursing facility              Anticipated d/c date is: 11/28/ the last #1 #2 2022              Patient currently stable for discharge  Barrier to discharge-awaiting bed at skilled nursing facility  COVID-19 Labs  No results for input(s): DDIMER, FERRITIN, LDH, CRP in the last 72 hours.  Lab Results  Component Value Date   SARSCOV2NAA NEGATIVE 07/14/2021   [place] NEGATIVE 03/15/2021   [place] NEGATIVE 02/12/2020   [place] NEGATIVE 02/06/2020     Pressure Injury 02/12/20 Heel Right Deep Tissue Pressure Injury - Purple or maroon localized area of discolored intact skin or blood-filled blister due to damage of underlying soft tissue from pressure and/or shear. (Active)  02/12/20 0945  Location: Heel  Location Orientation: Right  Staging: Deep Tissue Pressure Injury - Purple or maroon localized area of discolored intact skin or blood-filled blister due to damage of underlying soft tissue from pressure and/or shear.  Wound Description (Comments):   Present on Admission: Yes        Recent Results (from the past 240 hour(s))  Culture, blood (x 2)     Status: None (Preliminary result)   Collection Time: 07/27/21  9:15 AM   Specimen: BLOOD  Result Value Ref Range Status   Specimen Description BLOOD RIGHT ANTECUBITAL  Final   Special Requests   Final    BOTTLES DRAWN AEROBIC AND ANAEROBIC Blood Culture results may not be optimal due to an excessive volume of blood received in culture bottles   Culture   Final    NO GROWTH < 12 HOURS Performed at [facility] [address] [place], [place] [address]    Report Status PENDING  Incomplete   Culture, blood (x 2)     Status: None (Preliminary result)   Collection Time: 07/27/21  9:16 AM   Specimen: BLOOD RIGHT HAND  Result Value Ref Range Status   Specimen Description BLOOD RIGHT HAND  Final   Special Requests   Final    BOTTLES DRAWN AEROBIC AND ANAEROBIC Blood Culture results may not be optimal due to an excessive volume of blood received in culture bottles   Culture   Final    NO GROWTH < 12 HOURS Performed at [facility], [place] [address] [place], [place] [address]    Report Status PENDING  Incomplete    Oswald Hillock   [facility] If 7PM-7AM, please contact night-coverage at [email or web address], Office  [phone]   07/28/2021, 9:10 AM  LOS: 13 days

## 2021-07-29 DIAGNOSIS — Z515 Encounter for palliative care: Secondary | ICD-10-CM

## 2021-07-29 DIAGNOSIS — J9601 Acute respiratory failure with hypoxia: Secondary | ICD-10-CM | POA: Diagnosis not present

## 2021-07-29 DIAGNOSIS — A419 Sepsis, unspecified organism: Secondary | ICD-10-CM | POA: Diagnosis not present

## 2021-07-29 DIAGNOSIS — R040 Epistaxis: Secondary | ICD-10-CM | POA: Diagnosis not present

## 2021-07-29 DIAGNOSIS — G8222 Paraplegia, incomplete: Secondary | ICD-10-CM

## 2021-07-29 DIAGNOSIS — G0490 Encephalitis and encephalomyelitis, unspecified: Secondary | ICD-10-CM | POA: Diagnosis not present

## 2021-07-29 DIAGNOSIS — Z66 Do not resuscitate: Secondary | ICD-10-CM

## 2021-07-29 DIAGNOSIS — G894 Chronic pain syndrome: Secondary | ICD-10-CM

## 2021-07-29 DIAGNOSIS — N390 Urinary tract infection, site not specified: Secondary | ICD-10-CM | POA: Diagnosis not present

## 2021-07-29 DIAGNOSIS — T83511A Infection and inflammatory reaction due to indwelling urethral catheter, initial encounter: Secondary | ICD-10-CM | POA: Diagnosis not present

## 2021-07-29 DIAGNOSIS — R531 Weakness: Secondary | ICD-10-CM | POA: Diagnosis not present

## 2021-07-29 LAB — URINE CULTURE: Culture: >=100000 — AB

## 2021-07-29 LAB — PROCALCITONIN: Procalcitonin: <0.1 ng/mL

## 2021-07-29 MED ORDER — AMOXICILLIN 500 MG PO CAPS
500.0000 mg | ORAL_CAPSULE | Freq: Three times a day (TID) | ORAL | Status: DC
  Administered 2021-07-30 – 2021-07-31 (×3): 500 mg via ORAL
  Filled 2021-07-29 (×7): qty 1

## 2021-07-29 MED ORDER — SALINE SPRAY 0.65 % NA SOLN
1.0000 | NASAL | Status: DC | PRN
  Administered 2021-07-31: 18:00:00 1 via NASAL
  Filled 2021-07-29: qty 44

## 2021-07-29 MED ORDER — LACTATED RINGERS IV BOLUS
500.0000 mL | Freq: Once | INTRAVENOUS | Status: AC
  Administered 2021-07-29: 500 mL via INTRAVENOUS

## 2021-07-29 MED ORDER — MELATONIN 5 MG PO TABS
5.0000 mg | ORAL_TABLET | Freq: Every day | ORAL | Status: DC
  Administered 2021-07-29 – 2021-08-02 (×5): 5 mg via ORAL
  Filled 2021-07-29 (×5): qty 1

## 2021-07-29 MED ORDER — LACTATED RINGERS IV SOLN: INTRAVENOUS | Status: DC

## 2021-07-29 NOTE — Consult Note (Signed)
Consultation Note Date: 07/29/2021   Patient Name: Katelyn Lamb  DOB: Oct 06, 1945  MRN: [id number]  Age / Sex: 75 y.o., female  PCP: [name], MD Referring Physician: Oswald Hillock, MD  Reason for Consultation: Establishing goals of care  HPI/Patient Profile: 75 y.o. female  with past medical history of encephalomyelitis, transverse colitis HSV (diagnosed 5 years ago), neurogenic bladder with suprapubic catheter, incomplete paraplegia, and recurrent UTIs admitted on 07/14/2021 with weakness and unable to ambulate.   During her hospitalization patient developed epistaxis from the left nare, Rhino Rocket was unsuccessful, ENT was consulted, and patient had to be intubated for interventional procedure with embolization of left sphenopalatine artery on 11/15.  She was successfully extubated on 11/16.  Palliative medicine was consulted to discuss goals of care. Clinical Assessment and Goals of Care: I have reviewed medical records including EPIC notes, labs and imaging, assessed the patient and then met with patient at bedside to discuss diagnosis prognosis, GOC, EOL wishes, disposition and options.  I introduced Palliative Medicine as specialized medical care for people living with serious illness. It focuses on providing relief from the symptoms and stress of a serious illness. The goal is to improve quality of life for both the patient and the family.  We discussed a brief life review of the patient.  She has been married over 38 years to her husband.  She has 2 children, a son and a daughter.  She also has 6 grandchildren and a great grandchild.  She shares that her family and her faith are very important to her.  As far as functional and nutritional status prior to admission patient relied heavily on her husband for assistance with all ADLs.  She shares that she is able to feed herself but is no longer able  to cook for herself, wipe herself after going to the bathroom, wash herself, or transfer from bed to chair or chair to bed without maximum assistance.  We discussed patient's current illness and what it means in the larger context of patient's on-going co-morbidities.  Natural disease trajectory and expectations at EOL were discussed. I attempted to elicit values and goals of care important to the patient.  Patient shared that being with her family and spending time with them is the most important thing to her.   The difference between aggressive medical intervention and comfort care was considered in light of the patient's goals of care. Education offered regarding concept specific to human mortality and the limitations of medical interventions to prolong life when the body begins to have significant physiological changes.  Therapeutic silence and space provided for patient to share her thoughts and emotions.  After our discussion I shared that I was available to speak with her husband.  She shared he would be at the hospital sometime today.  I advised her to let me know when they are ready to speak with me again.  Received a call from patient's husband and came to bedside to further discussed patient's diagnosis and prognosis with  patient and husband at bedside.  Therapeutic listening provided to patient's husband.  He is frustrated with the patient's disposition and placement options.  Concepts specific to code status and rehospitalization were considered and discussed.  Patient was clear that she would not want to have cardiopulmonary resuscitation should her heart and her lungs stop working.  Discussed that allowing a natural death involves changing her CODE STATUS to a DNR.  Patient was in agreement.  Patient's husband at bedside was also in agreement.  Patient CODE STATUS is now DNR.  A comfort pathway as well as an aggressive medical pathway were again both reviewed in detail.  Risk and  benefits of continuing with medical treatment as well as risks and benefits of continue with a comfort pathway fully outlined to both patient and her spouse.     Discussed with patient/family the importance of continued conversation with family and the medical providers regarding overall plan of care and treatment options, ensuring decisions are within the context of the patient's values and GOCs.    Hospice and Palliative Care services outpatient were explained and offered.  Husband verbalized that patient was not ready for hospice services at this time.  Both patient and husband seems to defer discussion of patient's wishes at end-of-life.  I encouraged them to continue these conversations even though they are difficult wants to have.  Palliative outpatient services referral to be placed.  Questions and concerns were addressed. The family was encouraged to call with questions or concerns.  I advised the family that we will need to monitor the patient throughout her hospitalization and that we will intervene if contacted or if patient's status declines.  At this point patient continues to have issues with hypotension, as well as apparent decline of functional status.  She is not medically stable for discharge at this time.  Given her encephalomyelitis and transverse colitis her biggest concern at this time seems to be where she will be discharged to.  TOC social worker and case manager already aware of patient and patient's husband's request for skilled nursing facility.   Primary Decision Maker PATIENT  Code Status/Advance Care Planning: DNR Outpatient palliative services referral placed Monitor for patient's readiness for SNF for rehab vs need for LTC  Prognosis:   Unable to determine  Discharge Planning: [facility] for rehab with Palliative care service follow-up  Primary Diagnoses: Present on Admission:  Urinary tract infection associated with catheterization of urinary  tract, initial encounter ([place])  Chronic pain syndrome  Incomplete paraplegia (HCC)  History of ESBL E. coli infection  History of ESBL Klebsiella pneumoniae infection   Physical Exam Vitals and nursing note reviewed.  Constitutional:      Appearance: Normal appearance.  HENT:     Head: Normocephalic and atraumatic.     Mouth/Throat:     Mouth: Mucous membranes are moist.  Cardiovascular:     Rate and Rhythm: Normal rate.     Pulses: Normal pulses.  Pulmonary:     Effort: Pulmonary effort is normal.  Abdominal:     Palpations: Abdomen is soft.  Musculoskeletal:     Comments: Bilateral LE weakness - incomplete paraplegia  Skin:    General: Skin is warm and dry.  Neurological:     Mental Status: She is alert and oriented to person, place, and time. Mental status is at baseline.  Psychiatric:        Mood and Affect: Mood normal.        Behavior: Behavior  normal.        Thought Content: Thought content normal.        Judgment: Judgment normal.    Vital Signs: BP 99/63 (BP Location: Right Arm)   Pulse 88   Temp 97.9 F (36.6 C) (Oral)   Resp 18   Ht _0  (1.702 m)   Wt 77.2 kg   SpO2 95%   BMI 26.66 kg/m  Pain Scale: 0-10   Pain Score: 0-No pain SpO2: SpO2: 95 % O2 Device:SpO2: 95 % O2 Flow Rate: .O2 Flow Rate (L/min): 5 L/min  Palliative Assessment/Data: 30%     I discussed this patient's plan of care with patient, patient's husband, Dr. Darrick Meigs.  Thank you for this consult. Palliative medicine will continue to follow and assist holistically.   Time Total: 140 minutes Greater than 50%  of this time was spent counseling and coordinating care related to the above assessment and plan.  Signed by: Jordan Hawks, DNP, FNP-BC Palliative Medicine    Please contact Palliative Medicine Team phone at [phone] for questions and concerns.  For individual provider: See Shea Evans

## 2021-07-29 NOTE — Progress Notes (Signed)
Pt BP 87/60, P 91 notified Dr. Frederich Chick order given for 500 ml bolus of LR.   Renee Ramus

## 2021-07-29 NOTE — Progress Notes (Signed)
Pt suprapubic cath noted to be leaking bed and gown wet MD notified order placed for urology and to keep area clean and dry.

## 2021-07-29 NOTE — Plan of Care (Signed)

## 2021-07-29 NOTE — Progress Notes (Signed)
Pt stated her suprapubic catheter has been positional for the last year and she has had issues with leaking before. Currently pt is not having any leaking from catheter and wishes to hold off on exchanging catheter at this time. Will continue to monitor closely.  Clint Bolder, RN 07/29/21 9:37 PM

## 2021-07-29 NOTE — Progress Notes (Signed)
500 ml LR bolus complete pt BP 112/74. Will continue with plan of care.   Renee Ramus, RN

## 2021-07-29 NOTE — Progress Notes (Signed)
[facility] Hospital Liaison Note  Notified by [facility] manager of patient/family request for [facility] Palliative services at facility after discharge.  [facility] hospital liaison will follow patient for discharge disposition.  Please call with any hospice or outpatient palliative care related questions.  Thank you for the opportunity to participate in this patient's care.  Bobbie "Loren Racer, [place], [place] [phone]

## 2021-07-29 NOTE — Progress Notes (Signed)
[facility]  PROGRESS NOTE  Katelyn Lamb VHQ:[id number] DOB: 05/10/1946 DOA: 07/14/2021 PCP: [name], MD   Brief HPI:   75 year old female with a history of encephalomyelitis and transverse colitis HSV 5 years ago prior to admission, neurogenic bladder with suprapubic catheter, incomplete paraplegia, recurrent UTIs was admitted on 07/14/2021 due to weakness and unable to ambulate due to UTI.  Was started on meropenem.  ID was consulted on 07/15/2021 and antibiotics were held as it was thought to be due to colonization.  Unfortunately she developed epistaxis from left nare, Rhino Rocket was placed that was unsuccessful.  ENT was consulted to control the bleeding which initially was unsuccessful, patient had to be intubated for interventional procedure with embolization of the left sphenopalatine artery on 07/18/2021.  She was successfully extubated on 07/19/2021.  She was given Kcentra on 07/18/2021.    Subjective   Patient seen and examined, feels better today.  Wants to know when she can blow her nose.  She feels her oxygen via face tent is making her nasal passages dry.   Assessment/Plan:    Sepsis -Patient was tachycardic, febrile with temperature 101, hypotensive, also requiring 10 L/min of oxygen via face tent on 07/27/2021 -Sepsis work-up was initiated, lactic acid was 1.3, procalcitonin 19.0; today repeat procalcitonin is less than 0.10 -Patient responded to 500 cc LR bolus x1 -Sepsis physiology has resolved -Blood cultures x2, urine culture obtained; no growth till date -Urine culture grew 100,000 colonies of Enterococcus faecalis and 70,000 colonies of Klebsiella pneumoniae -She was started on vancomycin and meropenem -We will switch vancomycin to amoxicillin, continue meropenem; continue treatment for total 7 days -Patient takes prednisone 5 mg daily at home, prednisone was discontinued and patient started on Solu-Medrol 100 mg IV every 8 hours for stress dose  steroids. -Chest x-ray was unremarkable -Follow blood culture results.  Acute epistaxis of the left nare in the setting of anticoagulation with Xarelto -S/p embolization of the left sphenopalatine artery per IR -she has been on Xarelto ,she is not a candidate for anticoagulation -We will follow-up with the ENT and ENT will dictate when to start anticoagulation -She received 1 unit of PRBC, hemoglobin has been stable -Awaiting bed at skilled nursing facility -Start nasal saline Ocean Spray as needed  Acute respiratory failure with hypoxemia -Intubated for airway protection -Now on face tent with 5 L/min -Wean off oxygen as tolerated  History of transverse myelitis -Patient has incomplete paraplegia -She had declining physical strength recently -She will need skilled nursing facility -PT following  History of DVT -Xarelto on hold due to above -She has had 2 episodes of DVT in the past and reported benefit from restarting Xarelto in the future  Delirium -She was somnolent yesterday, much more clear today -Avoid narcotics and benzodiazepines   Goals of care -Discussed with patient's husband on phone, he is considering hospice at home.   -Palliative care was consulted, patient is now DNR -will have outpatient palliative care services     Medications     acyclovir  400 mg Oral BID   amoxicillin  500 mg Oral Q8H   Chlorhexidine Gluconate Cloth  6 each Topical Q0600   diazepam  5 mg Oral BID   DULoxetine  30 mg Oral q AM   DULoxetine  60 mg Oral QHS   hydrocortisone sod succinate (SOLU-CORTEF) inj  100 mg Intravenous Q8H   levETIRAcetam  250 mg Oral BID   mirabegron ER  50 mg Oral QHS  polyethylene glycol  17 g Oral Daily   pregabalin  100 mg Oral QHS   QUEtiapine  100 mg Oral QHS   senna-docusate  2 tablet Oral QHS     Data Reviewed:   CBG:  No results for input(s): GLUCAP in the last 168 hours.  SpO2: 94 % O2 Flow Rate (L/min): 5 L/min FiO2 (%): 28 %     Vitals:   07/29/21 0750 07/29/21 0945 07/29/21 1145 07/29/21 1547  BP: (!) 87/60 112/74 99/63 104/76  Pulse: 91  88 (!) 101  Resp: 18  18 20   Temp: (!) 97.3 F (36.3 C) (!) 97.4 F (36.3 C) 97.9 F (36.6 C) 98.8 F (37.1 C)  TempSrc: Oral Oral Oral Oral  SpO2: 93%  95% 94%  Weight:      Height:         Intake/Output Summary (Last 24 hours) at 07/29/2021 1552 Last data filed at 07/29/2021 1300 Gross per 24 hour  Intake 360 ml  Output 1500 ml  Net -1140 ml    11/24 1901 - 11/26 0700 In: 480 [P.O.:480] Out: 1900 [Urine:1900]  Filed Weights   07/26/21 0438 07/27/21 0537 07/28/21 0608  Weight: 78.2 kg 79.3 kg 77.2 kg    Data Reviewed: Basic Metabolic Panel: Recent Labs  Lab 07/26/21 0933 07/27/21 0856 07/28/21 0316  NA 138 137 139  K 3.6 3.8 3.6  CL 102 102 105  CO2 30 27 27   GLUCOSE 105* 107* 138*  BUN 12 13 15   CREATININE 0.81 0.77 0.65  CALCIUM 8.6* 8.7* 8.7*   Liver Function Tests: Recent Labs  Lab 07/26/21 0933 07/27/21 0856 07/28/21 0316  AST 23 25 22   ALT 37 35 35  ALKPHOS 97 104 89  BILITOT 0.3 0.7 0.4  PROT 5.6* 6.0* 5.6*  ALBUMIN 2.6* 2.8* 2.5*   No results for input(s): LIPASE, AMYLASE in the last 168 hours. No results for input(s): AMMONIA in the last 168 hours. CBC: Recent Labs  Lab 07/23/21 0347 07/23/21 2039 07/26/21 0933 07/27/21 0856 07/28/21 0316  WBC 10.7*  --  10.9* 12.9* 12.1*  NEUTROABS 6.3  --   --  7.8*  --   HGB 7.6* 9.8* 10.3* 10.9* 9.8*  HCT 24.3* 30.1* 33.4* 33.7* 31.3*  MCV 99.2  --  99.1 96.6 97.5  PLT 145*  --  177 191 191       Radiology Reports  No results found.     Antibiotics: Anti-infectives (From admission, onward)    Start     Dose/Rate Route Frequency Ordered Stop   07/29/21 2200  amoxicillin (AMOXIL) capsule 500 mg        500 mg Oral Every 8 hours 07/29/21 1527 08/04/21 2159   07/28/21 1000  vancomycin (VANCOREADY) IVPB 1500 mg/300 mL  Status:  Discontinued        1,500 mg 150  mL/hr over 120 Minutes Intravenous Every 24 hours 07/27/21 0849 07/29/21 1527   07/27/21 1400  meropenem (MERREM) 2 g in sodium chloride 0.9 % 100 mL IVPB        2 g 200 mL/hr over 30 Minutes Intravenous Every 8 hours 07/27/21 0849 08/04/21 2359   07/27/21 0930  vancomycin (VANCOREADY) IVPB 1750 mg/350 mL        1,750 mg 175 mL/hr over 120 Minutes Intravenous  Once 07/27/21 0841 07/27/21 1242   07/27/21 0915  vancomycin (VANCOREADY) IVPB 1000 mg/200 mL  Status:  Discontinued        1,000 mg  200 mL/hr over 60 Minutes Intravenous  Once 07/27/21 0818 07/27/21 0840   07/27/21 0915  meropenem (MERREM) 1 g in sodium chloride 0.9 % 100 mL IVPB        1 g 200 mL/hr over 30 Minutes Intravenous  Once 07/27/21 0818 07/27/21 1024   07/21/21 1000  acyclovir (ZOVIRAX) tablet 400 mg        400 mg Oral 2 times daily 07/21/21 0124     07/19/21 2200  acyclovir (ZOVIRAX) 200 MG/5ML suspension SUSP 400 mg  Status:  Discontinued        400 mg Oral 2 times daily 07/19/21 1547 07/21/21 0123   07/18/21 1115  acyclovir (ZOVIRAX) 200 MG/5ML suspension SUSP 400 mg  Status:  Discontinued        400 mg Per Tube 2 times daily 07/18/21 1016 07/19/21 1547   07/18/21 0137  ceFAZolin (ANCEF) 2-4 GM/100ML-% IVPB       Note to Pharmacy: Ubaldo Glassing   : cabinet override      07/18/21 0137 07/18/21 1344   07/15/21 0000  acyclovir (ZOVIRAX) tablet 400 mg  Status:  Discontinued        400 mg Oral 2 times daily 07/14/21 2351 07/18/21 1016   07/14/21 2200  meropenem (MERREM) 1 g in sodium chloride 0.9 % 100 mL IVPB  Status:  Discontinued        1 g 200 mL/hr over 30 Minutes Intravenous Every 8 hours 07/14/21 2143 07/15/21 1251         DVT prophylaxis: SCDs  Code Status: Full code  Family Communication: No family at bedside   Consultants: ENT IR  Procedures: Embolization of left sphenopalatine artery    Objective    Physical Examination:  General-appears in no acute distress Heart-S1-S2, regular, no  murmur auscultated Lungs-clear to auscultation bilaterally, no wheezing or crackles auscultated Abdomen-soft, nontender, no organomegaly Extremities-no edema in the lower extremities Neuro-alert, oriented x3, no focal deficit noted  Status is: Inpatient  Dispo: The patient is from: Home              Anticipated d/c is to: Skilled nursing facility              Anticipated d/c date is: 11/28/               Patient currently stable for discharge  Barrier to discharge-awaiting bed at skilled nursing facility  COVID-19 Labs  No results for input(s): DDIMER, FERRITIN, LDH, CRP in the last 72 hours.  Lab Results  Component Value Date   SARSCOV2NAA NEGATIVE 07/14/2021   [place] NEGATIVE 03/15/2021   [place] NEGATIVE 02/12/2020   [place] NEGATIVE 02/06/2020     Pressure Injury 02/12/20 Heel Right Deep Tissue Pressure Injury - Purple or maroon localized area of discolored intact skin or blood-filled blister due to damage of underlying soft tissue from pressure and/or shear. (Active)  02/12/20 0945  Location: Heel  Location Orientation: Right  Staging: Deep Tissue Pressure Injury - Purple or maroon localized area of discolored intact skin or blood-filled blister due to damage of underlying soft tissue from pressure and/or shear.  Wound Description (Comments):   Present on Admission: Yes        Recent Results (from the past 240 hour(s))  Culture, blood (x 2)     Status: None (Preliminary result)   Collection Time: 07/27/21  9:15 AM   Specimen: BLOOD  Result Value Ref Range Status   Specimen Description BLOOD RIGHT ANTECUBITAL  Final  Special Requests   Final    BOTTLES DRAWN AEROBIC AND ANAEROBIC Blood Culture results may not be optimal due to an excessive volume of blood received in culture bottles   Culture   Final    NO GROWTH 2 DAYS Performed at [facility], [place] [address], [place], [place] [address]    Report Status PENDING  Incomplete  Culture,  blood (x 2)     Status: None (Preliminary result)   Collection Time: 07/27/21  9:16 AM   Specimen: BLOOD RIGHT HAND  Result Value Ref Range Status   Specimen Description BLOOD RIGHT HAND  Final   Special Requests   Final    BOTTLES DRAWN AEROBIC AND ANAEROBIC Blood Culture results may not be optimal due to an excessive volume of blood received in culture bottles   Culture   Final    NO GROWTH 2 DAYS Performed at [facility], [place] [address] [place], [place] [address]    Report Status PENDING  Incomplete  Urine Culture     Status: Abnormal   Collection Time: 07/27/21  2:46 PM   Specimen: Urine, Suprapubic  Result Value Ref Range Status   Specimen Description URINE, SUPRAPUBIC  Final   Special Requests   Final    NONE Performed at [facility], [place] [address], [place], [place] [address]    Culture (A)  Final    >=100,000 COLONIES/mL ENTEROCOCCUS FAECALIS 70,000 COLONIES/mL KLEBSIELLA PNEUMONIAE Confirmed Extended Spectrum Beta-Lactamase Producer (ESBL).  In bloodstream infections from ESBL organisms, carbapenems are preferred over piperacillin/tazobactam. They are shown to have a lower risk of mortality.    Report Status 07/29/2021 FINAL  Final   Organism ID, Bacteria ENTEROCOCCUS FAECALIS (A)  Final   Organism ID, Bacteria KLEBSIELLA PNEUMONIAE (A)  Final      Susceptibility   Enterococcus faecalis - MIC*    AMPICILLIN <=2 SENSITIVE Sensitive     NITROFURANTOIN <=16 SENSITIVE Sensitive     VANCOMYCIN 1 SENSITIVE Sensitive     * >=100,000 COLONIES/mL ENTEROCOCCUS FAECALIS   Klebsiella pneumoniae - MIC*    AMPICILLIN >=32 RESISTANT Resistant     CEFAZOLIN >=64 RESISTANT Resistant     CEFEPIME >=32 RESISTANT Resistant     CEFTRIAXONE >=64 RESISTANT Resistant     CIPROFLOXACIN >=4 RESISTANT Resistant     GENTAMICIN <=1 SENSITIVE Sensitive     IMIPENEM <=0.25 SENSITIVE Sensitive     NITROFURANTOIN 128 RESISTANT Resistant     TRIMETH/SULFA >=320 RESISTANT Resistant      AMPICILLIN/SULBACTAM >=32 RESISTANT Resistant     PIP/TAZO 32 INTERMEDIATE Intermediate     * 70,000 COLONIES/mL KLEBSIELLA PNEUMONIAE    [place]   [facility] If 7PM-7AM, please contact night-coverage at [email or web address], Office  [phone]   07/29/2021, 3:52 PM  LOS: 14 days

## 2021-07-30 DIAGNOSIS — A419 Sepsis, unspecified organism: Secondary | ICD-10-CM | POA: Diagnosis not present

## 2021-07-30 DIAGNOSIS — R531 Weakness: Secondary | ICD-10-CM | POA: Diagnosis not present

## 2021-07-30 DIAGNOSIS — G8222 Paraplegia, incomplete: Secondary | ICD-10-CM | POA: Diagnosis not present

## 2021-07-30 DIAGNOSIS — R040 Epistaxis: Secondary | ICD-10-CM | POA: Diagnosis not present

## 2021-07-30 NOTE — Progress Notes (Signed)
Pt refused AM labs. Pt states she is tired of being in the hospital and poked. RN encouraged pt to talk with her family and doctors about her goals of care. Will continue to monitor closely.  Clint Bolder, RN 07/30/21 2:28 AM

## 2021-07-30 NOTE — Progress Notes (Signed)
[facility]  PROGRESS NOTE  Katelyn Lamb OHY:[id number] DOB: 12-01-1945 DOA: 07/14/2021 PCP: [name], MD   Brief HPI:   75 year old female with a history of encephalomyelitis and transverse colitis HSV 5 years ago prior to admission, neurogenic bladder with suprapubic catheter, incomplete paraplegia, recurrent UTIs was admitted on 07/14/2021 due to weakness and unable to ambulate due to UTI.  Was started on meropenem.  ID was consulted on 07/15/2021 and antibiotics were held as it was thought to be due to colonization.  Unfortunately she developed epistaxis from left nare, Rhino Rocket was placed that was unsuccessful.  ENT was consulted to control the bleeding which initially was unsuccessful, patient had to be intubated for interventional procedure with embolization of the left sphenopalatine artery on 07/18/2021.  She was successfully extubated on 07/19/2021.  She was given Kcentra on 07/18/2021.    Subjective   Patient seen and examined, wants suprapubic catheter to be changed.  It has been leaking since yesterday.   Assessment/Plan:    Sepsis -Patient was tachycardic, febrile with temperature 101, hypotensive, also requiring 10 L/min of oxygen via face tent on 07/27/2021 -Sepsis work-up was initiated, lactic acid was 1.3, procalcitonin 19.0; today repeat procalcitonin is less than 0.10 -Patient responded to 500 cc LR bolus x1 -Sepsis physiology has resolved -Blood cultures x2, urine culture obtained; no growth till date -Urine culture grew 100,000 colonies of Enterococcus faecalis and 70,000 colonies of Klebsiella pneumoniae -She was started on vancomycin and meropenem -We will switch vancomycin to amoxicillin, continue meropenem; continue treatment for total 7 days -Patient takes prednisone 5 mg daily at home, prednisone was discontinued and patient started on Solu-Medrol 100 mg IV every 8 hours for stress dose steroids. -Chest x-ray was unremarkable -Follow blood  culture results.  Acute epistaxis of the left nare in the setting of anticoagulation with Xarelto -S/p embolization of the left sphenopalatine artery per IR -she has been on Xarelto ,she is not a candidate for anticoagulation -We will follow-up with the ENT and ENT will dictate when to start anticoagulation -She received 1 unit of PRBC, hemoglobin has been stable -Awaiting bed at skilled nursing facility -Start nasal saline Ocean Spray as needed  Acute respiratory failure with hypoxemia -Intubated for airway protection -Now on face tent with 5 L/min -Wean off oxygen as tolerated  History of transverse myelitis -Patient has incomplete paraplegia -She had declining physical strength recently -She will need skilled nursing facility -PT following  Urinary retention -Patient has suprapubic catheter in place which has been leaking -Called and discussed with urologist Dr. Abner Greenspan, he recommends catheter to be changed per RN.  History of DVT -Xarelto on hold due to above -She has had 2 episodes of DVT in the past and reported benefit from restarting Xarelto in the future  Delirium -She was somnolent yesterday, much more clear today -Avoid narcotics and benzodiazepines   Goals of care -Discussed with patient's husband on phone, he is considering hospice at home.   -Palliative care was consulted, patient is now DNR -will have outpatient palliative care services     Medications     acyclovir  400 mg Oral BID   amoxicillin  500 mg Oral Q8H   Chlorhexidine Gluconate Cloth  6 each Topical Q0600   diazepam  5 mg Oral BID   DULoxetine  30 mg Oral q AM   DULoxetine  60 mg Oral QHS   hydrocortisone sod succinate (SOLU-CORTEF) inj  100 mg Intravenous Q8H   levETIRAcetam  250  mg Oral BID   melatonin  5 mg Oral QHS   mirabegron ER  50 mg Oral QHS   polyethylene glycol  17 g Oral Daily   pregabalin  100 mg Oral QHS   QUEtiapine  100 mg Oral QHS   senna-docusate  2 tablet Oral QHS      Data Reviewed:   CBG:  No results for input(s): GLUCAP in the last 168 hours.  SpO2: 95 % O2 Flow Rate (L/min): 5 L/min FiO2 (%): 28 %    Vitals:   07/29/21 2340 07/30/21 0432 07/30/21 0746 07/30/21 1125  BP: (!) 122/94 134/90 103/68   Pulse: 94 97 87   Resp: 20 15 13    Temp: 99.5 F (37.5 C) 97.7 F (36.5 C) (!) 97.4 F (36.3 C) (!) 97.5 F (36.4 C)  TempSrc: Axillary Axillary Oral Oral  SpO2: 96% 94% 95%   Weight:  81.7 kg    Height:         Intake/Output Summary (Last 24 hours) at 07/30/2021 1714 Last data filed at 07/30/2021 1300 Gross per 24 hour  Intake 2245.29 ml  Output 2250 ml  Net -4.71 ml    11/25 1901 - 11/27 0700 In: 2744.8 [P.O.:600; I.V.:1643.6] Out: 2600 [Urine:2600]  Filed Weights   07/27/21 0537 07/28/21 0608 07/30/21 0432  Weight: 79.3 kg 77.2 kg 81.7 kg    Data Reviewed: Basic Metabolic Panel: Recent Labs  Lab 07/26/21 0933 07/27/21 0856 07/28/21 0316  NA 138 137 139  K 3.6 3.8 3.6  CL 102 102 105  CO2 30 27 27   GLUCOSE 105* 107* 138*  BUN 12 13 15   CREATININE 0.81 0.77 0.65  CALCIUM 8.6* 8.7* 8.7*   Liver Function Tests: Recent Labs  Lab 07/26/21 0933 07/27/21 0856 07/28/21 0316  AST 23 25 22   ALT 37 35 35  ALKPHOS 97 104 89  BILITOT 0.3 0.7 0.4  PROT 5.6* 6.0* 5.6*  ALBUMIN 2.6* 2.8* 2.5*   No results for input(s): LIPASE, AMYLASE in the last 168 hours. No results for input(s): AMMONIA in the last 168 hours. CBC: Recent Labs  Lab 07/23/21 2039 07/26/21 0933 07/27/21 0856 07/28/21 0316  WBC  --  10.9* 12.9* 12.1*  NEUTROABS  --   --  7.8*  --   HGB 9.8* 10.3* 10.9* 9.8*  HCT 30.1* 33.4* 33.7* 31.3*  MCV  --  99.1 96.6 97.5  PLT  --  177 191 191       Radiology Reports  No results found.     Antibiotics: Anti-infectives (From admission, onward)    Start     Dose/Rate Route Frequency Ordered Stop   07/29/21 2200  amoxicillin (AMOXIL) capsule 500 mg        500 mg Oral Every 8 hours  07/29/21 1527 08/04/21 2159   07/28/21 1000  vancomycin (VANCOREADY) IVPB 1500 mg/300 mL  Status:  Discontinued        1,500 mg 150 mL/hr over 120 Minutes Intravenous Every 24 hours 07/27/21 0849 07/29/21 1527   07/27/21 1400  meropenem (MERREM) 2 g in sodium chloride 0.9 % 100 mL IVPB        2 g 200 mL/hr over 30 Minutes Intravenous Every 8 hours 07/27/21 0849 08/04/21 2359   07/27/21 0930  vancomycin (VANCOREADY) IVPB 1750 mg/350 mL        1,750 mg 175 mL/hr over 120 Minutes Intravenous  Once 07/27/21 0841 07/27/21 1242   07/27/21 0915  vancomycin (VANCOREADY) IVPB 1000  mg/200 mL  Status:  Discontinued        1,000 mg 200 mL/hr over 60 Minutes Intravenous  Once 07/27/21 0818 07/27/21 0840   07/27/21 0915  meropenem (MERREM) 1 g in sodium chloride 0.9 % 100 mL IVPB        1 g 200 mL/hr over 30 Minutes Intravenous  Once 07/27/21 0818 07/27/21 1024   07/21/21 1000  acyclovir (ZOVIRAX) tablet 400 mg        400 mg Oral 2 times daily 07/21/21 0124     07/19/21 2200  acyclovir (ZOVIRAX) 200 MG/5ML suspension SUSP 400 mg  Status:  Discontinued        400 mg Oral 2 times daily 07/19/21 1547 07/21/21 0123   07/18/21 1115  acyclovir (ZOVIRAX) 200 MG/5ML suspension SUSP 400 mg  Status:  Discontinued        400 mg Per Tube 2 times daily 07/18/21 1016 07/19/21 1547   07/18/21 0137  ceFAZolin (ANCEF) 2-4 GM/100ML-% IVPB       Note to Pharmacy: Ubaldo Glassing   : cabinet override      07/18/21 0137 07/18/21 1344   07/15/21 0000  acyclovir (ZOVIRAX) tablet 400 mg  Status:  Discontinued        400 mg Oral 2 times daily 07/14/21 2351 07/18/21 1016   07/14/21 2200  meropenem (MERREM) 1 g in sodium chloride 0.9 % 100 mL IVPB  Status:  Discontinued        1 g 200 mL/hr over 30 Minutes Intravenous Every 8 hours 07/14/21 2143 07/15/21 1251         DVT prophylaxis: SCDs  Code Status: Full code  Family Communication: No family at bedside   Consultants: ENT IR  Procedures: Embolization of  left sphenopalatine artery    Objective    Physical Examination:  General-appears in no acute distress Heart-S1-S2, regular, no murmur auscultated Lungs-clear to auscultation bilaterally, no wheezing or crackles auscultated Abdomen-soft, nontender, no organomegaly, suprapubic catheter in place, leaking Extremities-no edema in the lower extremities Neuro-alert, oriented x3, no focal deficit noted   Status is: Inpatient  Dispo: The patient is from: Home              Anticipated d/c is to: Skilled nursing facility              Anticipated d/c date is: 07/31/21              Patient currently stable for discharge  Barrier to discharge-awaiting bed at skilled nursing facility  COVID-19 Labs  No results for input(s): DDIMER, FERRITIN, LDH, CRP in the last 72 hours.  Lab Results  Component Value Date   SARSCOV2NAA NEGATIVE 07/14/2021   [place] NEGATIVE 03/15/2021   [place] NEGATIVE 02/12/2020   [place] NEGATIVE 02/06/2020     Pressure Injury 02/12/20 Heel Right Deep Tissue Pressure Injury - Purple or maroon localized area of discolored intact skin or blood-filled blister due to damage of underlying soft tissue from pressure and/or shear. (Active)  02/12/20 0945  Location: Heel  Location Orientation: Right  Staging: Deep Tissue Pressure Injury - Purple or maroon localized area of discolored intact skin or blood-filled blister due to damage of underlying soft tissue from pressure and/or shear.  Wound Description (Comments):   Present on Admission: Yes        Recent Results (from the past 240 hour(s))  Culture, blood (x 2)     Status: None (Preliminary result)   Collection Time: 07/27/21  9:15  AM   Specimen: BLOOD  Result Value Ref Range Status   Specimen Description BLOOD RIGHT ANTECUBITAL  Final   Special Requests   Final    BOTTLES DRAWN AEROBIC AND ANAEROBIC Blood Culture results may not be optimal due to an excessive volume of blood received in culture  bottles   Culture   Final    NO GROWTH 3 DAYS Performed at [facility] [address] [place], [place] [address]    Report Status PENDING  Incomplete  Culture, blood (x 2)     Status: None (Preliminary result)   Collection Time: 07/27/21  9:16 AM   Specimen: BLOOD RIGHT HAND  Result Value Ref Range Status   Specimen Description BLOOD RIGHT HAND  Final   Special Requests   Final    BOTTLES DRAWN AEROBIC AND ANAEROBIC Blood Culture results may not be optimal due to an excessive volume of blood received in culture bottles   Culture   Final    NO GROWTH 3 DAYS Performed at [facility], [place] [address] [place], [place] [address]    Report Status PENDING  Incomplete  Urine Culture     Status: Abnormal   Collection Time: 07/27/21  2:46 PM   Specimen: Urine, Suprapubic  Result Value Ref Range Status   Specimen Description URINE, SUPRAPUBIC  Final   Special Requests   Final    NONE Performed at [facility], [place] [address], [place], [place] [address]    Culture (A)  Final    >=100,000 COLONIES/mL ENTEROCOCCUS FAECALIS 70,000 COLONIES/mL KLEBSIELLA PNEUMONIAE Confirmed Extended Spectrum Beta-Lactamase Producer (ESBL).  In bloodstream infections from ESBL organisms, carbapenems are preferred over piperacillin/tazobactam. They are shown to have a lower risk of mortality.    Report Status 07/29/2021 FINAL  Final   Organism ID, Bacteria ENTEROCOCCUS FAECALIS (A)  Final   Organism ID, Bacteria KLEBSIELLA PNEUMONIAE (A)  Final      Susceptibility   Enterococcus faecalis - MIC*    AMPICILLIN <=2 SENSITIVE Sensitive     NITROFURANTOIN <=16 SENSITIVE Sensitive     VANCOMYCIN 1 SENSITIVE Sensitive     * >=100,000 COLONIES/mL ENTEROCOCCUS FAECALIS   Klebsiella pneumoniae - MIC*    AMPICILLIN >=32 RESISTANT Resistant     CEFAZOLIN >=64 RESISTANT Resistant     CEFEPIME >=32 RESISTANT Resistant     CEFTRIAXONE >=64 RESISTANT Resistant     CIPROFLOXACIN >=4 RESISTANT  Resistant     GENTAMICIN <=1 SENSITIVE Sensitive     IMIPENEM <=0.25 SENSITIVE Sensitive     NITROFURANTOIN 128 RESISTANT Resistant     TRIMETH/SULFA >=320 RESISTANT Resistant     AMPICILLIN/SULBACTAM >=32 RESISTANT Resistant     PIP/TAZO 32 INTERMEDIATE Intermediate     * 70,000 COLONIES/mL KLEBSIELLA PNEUMONIAE    [place]   [facility] If 7PM-7AM, please contact night-coverage at [email or web address], Office  [phone]   07/30/2021, 5:14 PM  LOS: 15 days

## 2021-07-30 NOTE — Progress Notes (Signed)
Supra pubic catheter exchanged per MD order. Urine returned. Patient tolerated procedure comfortably. Foley care done post procedure.

## 2021-07-31 ENCOUNTER — Encounter (HOSPITAL_COMMUNITY): Payer: Self-pay | Admitting: Internal Medicine

## 2021-07-31 DIAGNOSIS — R040 Epistaxis: Secondary | ICD-10-CM | POA: Diagnosis not present

## 2021-07-31 DIAGNOSIS — A419 Sepsis, unspecified organism: Secondary | ICD-10-CM | POA: Diagnosis not present

## 2021-07-31 DIAGNOSIS — G8222 Paraplegia, incomplete: Secondary | ICD-10-CM | POA: Diagnosis not present

## 2021-07-31 DIAGNOSIS — R531 Weakness: Secondary | ICD-10-CM | POA: Diagnosis not present

## 2021-07-31 MED ORDER — ADULT MULTIVITAMIN W/MINERALS CH
1.0000 | ORAL_TABLET | Freq: Every day | ORAL | Status: DC
  Administered 2021-08-01 – 2021-08-02 (×2): 1 via ORAL
  Filled 2021-07-31 (×2): qty 1

## 2021-07-31 MED ORDER — ENSURE ENLIVE PO LIQD
237.0000 mL | Freq: Two times a day (BID) | ORAL | Status: DC
Start: 2021-07-31 — End: 2021-08-03
  Administered 2021-07-31 – 2021-08-02 (×4): 237 mL via ORAL

## 2021-07-31 MED ORDER — PREDNISONE 20 MG PO TABS
40.0000 mg | ORAL_TABLET | Freq: Every day | ORAL | Status: DC
  Administered 2021-08-01 – 2021-08-02 (×2): 40 mg via ORAL
  Filled 2021-07-31 (×2): qty 2

## 2021-07-31 MED ORDER — SODIUM CHLORIDE 0.9 % IV SOLN
1.0000 g | Freq: Three times a day (TID) | INTRAVENOUS | Status: DC
  Filled 2021-07-31 (×4): qty 1

## 2021-07-31 MED ORDER — SACCHAROMYCES BOULARDII 250 MG PO CAPS
ORAL_CAPSULE | ORAL | Status: AC
  Filled 2021-07-31: qty 1

## 2021-07-31 MED ORDER — SACCHAROMYCES BOULARDII 250 MG PO CAPS
250.0000 mg | ORAL_CAPSULE | Freq: Two times a day (BID) | ORAL | Status: DC
  Administered 2021-07-31 – 2021-08-02 (×5): 250 mg via ORAL
  Filled 2021-07-31 (×3): qty 1

## 2021-07-31 MED ORDER — PREDNISONE 20 MG PO TABS
40.0000 mg | ORAL_TABLET | Freq: Once | ORAL | Status: AC
  Administered 2021-07-31: 21:00:00 40 mg via ORAL
  Filled 2021-07-31: qty 2

## 2021-07-31 NOTE — Progress Notes (Signed)
PHARMACY NOTE:  ANTIMICROBIAL RENAL DOSAGE ADJUSTMENT  Current antimicrobial regimen includes a mismatch between antimicrobial dosage and estimated renal function.  As per policy approved by the Pharmacy & Therapeutics and Medical Executive Committees, the antimicrobial dosage will be adjusted accordingly.  Current antimicrobial dosage:  Meropenem 2 gm IV every 8 hours   Indication: UTI  Renal Function:  Estimated Creatinine Clearance: 68.6 mL/min (by C-G formula based on SCr of 0.65 mg/dL). []      On intermittent HD, scheduled: []      On CRRT    Antimicrobial dosage has been changed to:  Meropenem 1 gm every 8 hours   Additional comments: Lowered dose to target UTI - No CNS involvement    Thank you for allowing pharmacy to be a part of this patient's care.  [name], PharmD, BCPS, [place] [facility] Clinical Pharmacist Phone: [phone] 07/31/2021 2:03 PM

## 2021-07-31 NOTE — Progress Notes (Signed)
Initial Nutrition Assessment  DOCUMENTATION CODES:   Not applicable  INTERVENTION:   - Ensure Enlive po BID, each supplement provides 350 kcal and 20 grams of protein  - MVI with minerals daily  - Encourage PO intake  NUTRITION DIAGNOSIS:   Increased nutrient needs related to acute illness as evidenced by estimated needs.  GOAL:   Patient will meet greater than or equal to 90% of their needs  MONITOR:   PO intake, Supplement acceptance, Labs, Weight trends, I & O's  REASON FOR ASSESSMENT:   Malnutrition Screening Tool    ASSESSMENT:   75 year old female who presented to the ED on 11/11 with worsening weakness. 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 Pt admitted with CAUTI.  11/15 - s/p diagnostic cerebral angiogram and endovascular epistaxis embolization, intubated 11/16 - extubated 11/27 - suprapubic catheter exchanged  Unable to reach pt via phone call to room. Per review of meal completion records, PO intake has been good, averaging 76% over the last 8 documented meals. RD to order oral nutrition supplements to aid pt in meeting kcal and protein needs. Will also order daily MVI with minerals.  Reviewed weight history in chart. Weight trending up over the last year. Pt's weight has fluctuated between 76-82 kg since admission. Suspect admit weight of 68 kg was copied over from previous encounter given it is an outlier. Per nursing edema assessment, pt with non-pitting edema to BLE.  Admit weight: 78 kg Current weight: 81 kg  Meal Completion: 15-100% x last 8 meals (averaging 76%)  Medications reviewed and include: IV solu-cortef, melatonin, miralax, senna, IV abx IVF: LR @ 100 ml/hr  Labs reviewed.  UOP: 1625 ml x 24 hours I/O's: -4.6 L since admit  NUTRITION - FOCUSED PHYSICAL EXAM:  Unable to complete at this time. RD working remotely.  Diet Order:    Diet Order             DIET DYS 3 Room service appropriate? Yes; Fluid consistency: Thin  Diet effective now                   EDUCATION NEEDS:   No education needs have been identified at this time  Skin:  Skin Assessment: Reviewed RN Assessment  Last BM:  07/31/21 smear type 6  Height:   Ht Readings from Last 1 Encounters:  07/18/21 5\' 7"  (1.702 m)    Weight:   Wt Readings from Last 1 Encounters:  07/31/21 81 kg    BMI:  Body mass index is 27.97 kg/m.  Estimated Nutritional Needs:   Kcal:  1800-2000  Protein:  85-100 grams  Fluid:  >/= 1.8 L    Gustavus Bryant, MS, RD, LDN Inpatient Clinical Dietitian Please see AMiON for contact information.

## 2021-07-31 NOTE — Progress Notes (Signed)
Pt lost IV access earlier today, refusing new IV but has IV abx still ordered. I educated the pt about this and asked again if we could get a new IV so she could finish her course of IV abx, no other route available, pt still refusing new IV. Will further educate and check with pt again about the IV later in my shift.

## 2021-07-31 NOTE — Plan of Care (Signed)

## 2021-07-31 NOTE — Progress Notes (Signed)
Patient off of oxygen most of the day.  When she took a nap, oxygen sat went down to 87% on RA.  Oxygen mask reapplied at 4L/[place], sats 94%.  Oxygen off again when patient was awake with sats in the mid 90's.

## 2021-07-31 NOTE — TOC Progression Note (Addendum)
Transition of Care [facility]) - Progression Note    Patient Details  Name: Katelyn Lamb MRN: [id number] Date of Birth: 1946-05-17  Transition of Care [facility]) CM/SW [name], [place] Phone Number: 07/31/2021, 2:23 PM  Clinical Narrative:     Patient's spouse sent CSW text message and inquired about SNF placement. CSW met with patient and spouse,Katelyn Lamb at bedside along with the [facility]. Her informed CSW, want to pursue SNF placement and re-send request for approval- he has spoken with [employer] concierge and he believes the outcome of previous denial ( 07/27/2021) may be different. CSW and RNCM explained if patient does not get approved for SNF, the plan will be to discharge home and [facility] team will work to assist with transition home. He states understanding.   CSW gave bed offers w/ [facility] and [facility] explained extensive search has been made and those are the only two options. Other SNFs contacted  [facility] declined  [facility]- sent message to re-view again per request of spouse [place] of network [facility] -not in [place]- waiting on response  [facility]- waiting on response  Patient's spouse explained to [facility], he contacted hospice/palliative care - only as inquires- researching possible options of care.  CSW contacted [facility]- will start authorization today CSW will continue to Follow and assist with discharge planning.  Thurmond Butts, MSW, LCSW Clinical Social Worker    Expected Discharge Plan: Skilled Nursing Facility Barriers to Discharge: Continued Medical Work up  Expected Discharge Plan and Services Expected Discharge Plan: [place]   Discharge Planning Services: CM Consult Post Acute Care Choice: [place] Living arrangements for the past 2 months: Single Family Home                                       Social Determinants of Health (SDOH) Interventions     Readmission Risk Interventions No flowsheet data found.

## 2021-07-31 NOTE — Progress Notes (Signed)
IV started leaking and d/c'd.  Patient did not want another one started, as she will be going home soon.  Discussed with Dr. Darrick Meigs and he is aware she does not want the IV.  IV solu-cortef d/c'd.  Orders for one time dose prednisone 40mg  ordered.  Asked if he would like more than that ordered/scheduled, as she was on 100mg  of solu-cortef Q8H.  No response yet.  Added Sarah RN to conversation and informed her of situation to clarify this order.

## 2021-07-31 NOTE — Progress Notes (Signed)
[facility]  PROGRESS NOTE  Katelyn Lamb [id number] DOB: 1945-09-14 DOA: 07/14/2021 PCP: [name], MD   Brief HPI:   75 year old female with a history of encephalomyelitis and transverse colitis HSV 5 years ago prior to admission, neurogenic bladder with suprapubic catheter, incomplete paraplegia, recurrent UTIs was admitted on 07/14/2021 due to weakness and unable to ambulate due to UTI.  Was started on meropenem.  ID was consulted on 07/15/2021 and antibiotics were held as it was thought to be due to colonization.  Unfortunately she developed epistaxis from left nare, Rhino Rocket was placed that was unsuccessful.  ENT was consulted to control the bleeding which initially was unsuccessful, patient had to be intubated for interventional procedure with embolization of the left sphenopalatine artery on 07/18/2021.  She was successfully extubated on 07/19/2021.  She was given Kcentra on 07/18/2021.    Subjective   Patient seen and examined, denies any complaints.   Assessment/Plan:    Sepsis -Patient was tachycardic, febrile with temperature 101, hypotensive, also requiring 10 L/min of oxygen via face tent on 07/27/2021 -Sepsis work-up was initiated, lactic acid was 1.3, procalcitonin 19.0; today repeat procalcitonin is less than 0.10 -Patient responded to 500 cc LR bolus x1 -Sepsis physiology has resolved -Blood cultures x2, urine culture obtained; no growth till date -Urine culture grew 100,000 colonies of Enterococcus faecalis and 70,000 colonies of Klebsiella pneumoniae -She was started on vancomycin and meropenem -We will switch vancomycin to amoxicillin, continue meropenem; continue treatment for total 7 days -Patient takes prednisone 5 mg daily at home, prednisone was discontinued and patient started on Solu-Medrol 100 mg IV every 8 hours for stress dose steroids. -Chest x-ray was unremarkable -Blood cultures x2 are negative to date  Acute epistaxis of the left nare  in the setting of anticoagulation with Xarelto -S/p embolization of the left sphenopalatine artery per IR -she has been on Xarelto ,she is not a candidate for anticoagulation -We will follow-up with the ENT and ENT will dictate when to start anticoagulation -She received 1 unit of PRBC, hemoglobin has been stable -Awaiting bed at skilled nursing facility -Start nasal saline [place] Spr[place] as needed  Acute respiratory failure with hypoxemia -Intubated for airway protection -Now on face tent with 5 L/min -Wean off oxygen as tolerated  History of transverse myelitis -Patient has incomplete paraplegia -She had declining physical strength recently -She will need skilled nursing facility -PT following  Urinary retention -Patient has suprapubic catheter in place which has been leaking -Called and discussed with urologist Dr. Abner Greenspan, he recommends catheter to be changed per RN. -Suprapubic catheter was replaced yesterday  History of DVT -Xarelto on hold due to above -She has had 2 episodes of DVT in the past and reported benefit from restarting Xarelto in the future  Delirium -She was somnolent yesterday, much more clear today -Avoid narcotics and benzodiazepines   Goals of care -Discussed with patient's husband on phone, he is considering hospice at home.   -Palliative care was consulted, patient is now DNR -will have outpatient palliative care services     Medications     acyclovir  400 mg Oral BID   Chlorhexidine Gluconate Cloth  6 each Topical Q0600   diazepam  5 mg Oral BID   DULoxetine  30 mg Oral q AM   DULoxetine  60 mg Oral QHS   feeding supplement  237 mL Oral BID BM   hydrocortisone sod succinate (SOLU-CORTEF) inj  100 mg Intravenous Q8H   levETIRAcetam  250  mg Oral BID   melatonin  5 mg Oral QHS   mirabegron ER  50 mg Oral QHS   [START ON 08/01/2021] multivitamin with minerals  1 tablet Oral Daily   polyethylene glycol  17 g Oral Daily   pregabalin  100 mg Oral  QHS   QUEtiapine  100 mg Oral QHS   senna-docusate  2 tablet Oral QHS     Data Reviewed:   CBG:  No results for input(s): GLUCAP in the last 168 hours.  SpO2: 95 % O2 Flow Rate (L/min): 5 L/min FiO2 (%): 28 %    Vitals:   07/31/21 1117 07/31/21 1517 07/31/21 1623 07/31/21 1627  BP:  116/83  113/78  Pulse:  (!) 107 (!) 110 (!) 104  Resp:  (!) 22 (!) 23 20  Temp: 97.6 F (36.4 C) 98.1 F (36.7 C)  98.1 F (36.7 C)  TempSrc: Oral Axillary    SpO2:  95% 94% 95%  Weight:      Height:         Intake/Output Summary (Last 24 hours) at 07/31/2021 1725 Last data filed at 07/31/2021 1144 Gross per 24 hour  Intake 3449.86 ml  Output 2425 ml  Net 1024.86 ml    11/26 1901 - 11/28 0700 In: 5215.2 [P.O.:1440; I.V.:3475.2] Out: 2775 [Urine:2775]  Filed Weights   07/28/21 0608 07/30/21 0432 07/31/21 0500  Weight: 77.2 kg 81.7 kg 81 kg    Data Reviewed: Basic Metabolic Panel: Recent Labs  Lab 07/26/21 0933 07/27/21 0856 07/28/21 0316  NA 138 137 139  K 3.6 3.8 3.6  CL 102 102 105  CO2 30 27 27   GLUCOSE 105* 107* 138*  BUN 12 13 15   CREATININE 0.81 0.77 0.65  CALCIUM 8.6* 8.7* 8.7*   Liver Function Tests: Recent Labs  Lab 07/26/21 0933 07/27/21 0856 07/28/21 0316  AST 23 25 22   ALT 37 35 35  ALKPHOS 97 104 89  BILITOT 0.3 0.7 0.4  PROT 5.6* 6.0* 5.6*  ALBUMIN 2.6* 2.8* 2.5*   No results for input(s): LIPASE, AMYLASE in the last 168 hours. No results for input(s): AMMONIA in the last 168 hours. CBC: Recent Labs  Lab 07/26/21 0933 07/27/21 0856 07/28/21 0316  WBC 10.9* 12.9* 12.1*  NEUTROABS  --  7.8*  --   HGB 10.3* 10.9* 9.8*  HCT 33.4* 33.7* 31.3*  MCV 99.1 96.6 97.5  PLT 177 191 191       Radiology Reports  No results found.     Antibiotics: Anti-infectives (From admission, onward)    Start     Dose/Rate Route Frequency Ordered Stop   07/31/21 2200  meropenem (MERREM) 1 g in sodium chloride 0.9 % 100 mL IVPB        1 g 200  mL/hr over 30 Minutes Intravenous Every 8 hours 07/31/21 1402 08/02/21 2359   07/29/21 2200  amoxicillin (AMOXIL) capsule 500 mg  Status:  Discontinued        500 mg Oral Every 8 hours 07/29/21 1527 07/31/21 1414   07/28/21 1000  vancomycin (VANCOREADY) IVPB 1500 mg/300 mL  Status:  Discontinued        1,500 mg 150 mL/hr over 120 Minutes Intravenous Every 24 hours 07/27/21 0849 07/29/21 1527   07/27/21 1400  meropenem (MERREM) 2 g in sodium chloride 0.9 % 100 mL IVPB  Status:  Discontinued        2 g 200 mL/hr over 30 Minutes Intravenous Every 8 hours 07/27/21 0849 07/31/21  1402   07/27/21 0930  vancomycin (VANCOREADY) IVPB 1750 mg/350 mL        1,750 mg 175 mL/hr over 120 Minutes Intravenous  Once 07/27/21 0841 07/27/21 1242   07/27/21 0915  vancomycin (VANCOREADY) IVPB 1000 mg/200 mL  Status:  Discontinued        1,000 mg 200 mL/hr over 60 Minutes Intravenous  Once 07/27/21 0818 07/27/21 0840   07/27/21 0915  meropenem (MERREM) 1 g in sodium chloride 0.9 % 100 mL IVPB        1 g 200 mL/hr over 30 Minutes Intravenous  Once 07/27/21 0818 07/27/21 1024   07/21/21 1000  acyclovir (ZOVIRAX) tablet 400 mg        400 mg Oral 2 times daily 07/21/21 0124     07/19/21 2200  acyclovir (ZOVIRAX) 200 MG/5ML suspension SUSP 400 mg  Status:  Discontinued        400 mg Oral 2 times daily 07/19/21 1547 07/21/21 0123   07/18/21 1115  acyclovir (ZOVIRAX) 200 MG/5ML suspension SUSP 400 mg  Status:  Discontinued        400 mg Per Tube 2 times daily 07/18/21 1016 07/19/21 1547   07/18/21 0137  ceFAZolin (ANCEF) 2-4 GM/100ML-% IVPB       Note to Pharmacy: Ubaldo Glassing   : cabinet override      07/18/21 0137 07/18/21 1344   07/15/21 0000  acyclovir (ZOVIRAX) tablet 400 mg  Status:  Discontinued        400 mg Oral 2 times daily 07/14/21 2351 07/18/21 1016   07/14/21 2200  meropenem (MERREM) 1 g in sodium chloride 0.9 % 100 mL IVPB  Status:  Discontinued        1 g 200 mL/hr over 30 Minutes Intravenous  Every 8 hours 07/14/21 2143 07/15/21 1251         DVT prophylaxis: SCDs  Code Status: Full code  Family Communication: No family at bedside   Consultants: ENT IR  Procedures: Embolization of left sphenopalatine artery    Objective    Physical Examination:  General-appears in no acute distress Heart-S1-S2, regular, no murmur auscultated Lungs-clear to auscultation bilaterally, no wheezing or crackles auscultated Abdomen-soft, nontender, no organomegaly Extremities-no edema in the lower extremities Neuro-alert, oriented x3, no focal deficit noted  Status is: Inpatient  Dispo: The patient is from: Home              Anticipated d/c is to: Skilled nursing facility              Anticipated d/c date is: 07/31/21              Patient currently stable for discharge  Barrier to discharge-awaiting bed at skilled nursing facility  COVID-19 Labs  No results for input(s): DDIMER, FERRITIN, LDH, CRP in the last 72 hours.  Lab Results  Component Value Date   SARSCOV2NAA NEGATIVE 07/14/2021   [place] NEGATIVE 03/15/2021   [place] NEGATIVE 02/12/2020   [place] NEGATIVE 02/06/2020     Pressure Injury 02/12/20 Heel Right Deep Tissue Pressure Injury - Purple or maroon localized area of discolored intact skin or blood-filled blister due to damage of underlying soft tissue from pressure and/or shear. (Active)  02/12/20 0945  Location: Heel  Location Orientation: Right  Staging: Deep Tissue Pressure Injury - Purple or maroon localized area of discolored intact skin or blood-filled blister due to damage of underlying soft tissue from pressure and/or shear.  Wound Description (Comments):   Present on  Admission: Yes        Recent Results (from the past 240 hour(s))  Culture, blood (x 2)     Status: None (Preliminary result)   Collection Time: 07/27/21  9:15 AM   Specimen: BLOOD  Result Value Ref Range Status   Specimen Description BLOOD RIGHT ANTECUBITAL   Final   Special Requests   Final    BOTTLES DRAWN AEROBIC AND ANAEROBIC Blood Culture results may not be optimal due to an excessive volume of blood received in culture bottles   Culture   Final    NO GROWTH 4 DAYS Performed at [facility], [place] [address] [place], [place] [address]    Report Status PENDING  Incomplete  Culture, blood (x 2)     Status: None (Preliminary result)   Collection Time: 07/27/21  9:16 AM   Specimen: BLOOD RIGHT HAND  Result Value Ref Range Status   Specimen Description BLOOD RIGHT HAND  Final   Special Requests   Final    BOTTLES DRAWN AEROBIC AND ANAEROBIC Blood Culture results may not be optimal due to an excessive volume of blood received in culture bottles   Culture   Final    NO GROWTH 4 DAYS Performed at [facility], [place] [address], [place], [place] [address]    Report Status PENDING  Incomplete  Urine Culture     Status: Abnormal   Collection Time: 07/27/21  2:46 PM   Specimen: Urine, Suprapubic  Result Value Ref Range Status   Specimen Description URINE, SUPRAPUBIC  Final   Special Requests   Final    NONE Performed at [facility], [place] [address] [place], [place] [address]    Culture (A)  Final    >=100,000 COLONIES/mL ENTEROCOCCUS FAECALIS 70,000 COLONIES/mL KLEBSIELLA PNEUMONIAE Confirmed Extended Spectrum Beta-Lactamase Producer (ESBL).  In bloodstream infections from ESBL organisms, carbapenems are preferred over piperacillin/tazobactam. They are shown to have a lower risk of mortality.    Report Status 07/29/2021 FINAL  Final   Organism ID, Bacteria ENTEROCOCCUS FAECALIS (A)  Final   Organism ID, Bacteria KLEBSIELLA PNEUMONIAE (A)  Final      Susceptibility   Enterococcus faecalis - MIC*    AMPICILLIN <=2 SENSITIVE Sensitive     NITROFURANTOIN <=16 SENSITIVE Sensitive     VANCOMYCIN 1 SENSITIVE Sensitive     * >=100,000 COLONIES/mL ENTEROCOCCUS FAECALIS   Klebsiella pneumoniae - MIC*    AMPICILLIN >=32 RESISTANT  Resistant     CEFAZOLIN >=64 RESISTANT Resistant     CEFEPIME >=32 RESISTANT Resistant     CEFTRIAXONE >=64 RESISTANT Resistant     CIPROFLOXACIN >=4 RESISTANT Resistant     GENTAMICIN <=1 SENSITIVE Sensitive     IMIPENEM <=0.25 SENSITIVE Sensitive     NITROFURANTOIN 128 RESISTANT Resistant     TRIMETH/SULFA >=320 RESISTANT Resistant     AMPICILLIN/SULBACTAM >=32 RESISTANT Resistant     PIP/TAZO 32 INTERMEDIATE Intermediate     * 70,000 COLONIES/mL KLEBSIELLA PNEUMONIAE    [place]   [facility] If 7PM-7AM, please contact night-coverage at [email or web address], Office  [phone]   07/31/2021, 5:25 PM  LOS: 16 days

## 2021-07-31 NOTE — Progress Notes (Signed)
Physical Therapy Treatment Patient Details Name: Katelyn Lamb MRN: [id number] DOB: 1946/08/31 Today's Date: 07/31/2021   History of Present Illness Pt is a 75 y.o. F who presents 07/14/2021 with working diagnosis of catheter associated UTI - likely EBSL and increasing bilateral lower extremity weakness. Patient with epistaxis. s/p embolization of L IMAX on 11/15. Intubated 11/15-11/16. 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    PT Comments    Pt sleeping upon arrival to room, wakes easily upon PT entrance. Pt motivated to participate in PT. Pt tolerated repeated sit<>stands in stedy, mostly used as standing frame. Pt with poor standing tolerance, but motivated to progress to PLOF. Pt also tolerated upwards of 20 minutes EOB sitting, PT focusing on pt maintaining upright sitting with and without UE support with multimodal cuing. Pt states she may d/c home instead of SNF (pending insurance), if she does d/c home she will need below equipment recommended as well as possibly a hoyer lift for OOB. PT to continue to follow.     Recommendations for follow up therapy are one component of a multi-disciplinary discharge planning process, led by the attending physician.  Recommendations may be updated based on patient status, additional functional criteria and insurance authorization.  Follow Up Recommendations  Skilled nursing-short term rehab (<3 hours/day)     Assistance Recommended at Discharge Frequent or [facility] bed;Wheelchair (measurements PT);Wheelchair cushion (measurements PT)    Recommendations for Other Services       Precautions / Restrictions Precautions Precautions: Fall Precaution Comments: suprapubic catheter Restrictions Weight Bearing Restrictions: No     Mobility  Bed Mobility Overal bed  mobility: Needs Assistance Bed Mobility: Supine to Sit;Sit to Supine     Supine to sit: Mod assist Sit to supine: Mod assist   General bed mobility comments: assist for trunk and LE management, scooting to/from EOB, and boost up in bed.    Transfers Overall transfer level: Needs assistance Equipment used: Ambulation equipment used Transfers: Sit to/from Stand Sit to Stand: Mod assist;From elevated surface           General transfer comment: mod assist for repeated sit<>stands in stedy for initial power up, hip extension via posterior pelvic facilitation, and steadying upon standing. STS x6, from EOB x1 and stedy x5.    Ambulation/Gait                   Stairs             Wheelchair Mobility    Modified Rankin (Stroke Patients Only)       Balance Overall balance assessment: Needs assistance Sitting-balance support: Feet supported;Single extremity supported Sitting balance-Leahy Scale: Fair Sitting balance - Comments: initially poor with posterior leaning, progressing to fair with UE Support on stedy and/or bedrail Postural control: Posterior lean Standing balance support: Bilateral upper extremity supported Standing balance-Leahy Scale: Poor Standing balance comment: reliant on external assist, tolerance x10 seconds standing                            Cognition Arousal/Alertness: Awake/alert Behavior During Therapy: Flat affect Overall Cognitive Status: Impaired/Different from baseline Area of Impairment: Following commands;Attention                   Current Attention Level: Selective   Following Commands: Follows one step commands with increased time  Safety/Judgement: Decreased awareness of deficits   Problem Solving: Requires verbal cues;Requires tactile cues General Comments: lacks some insight into present deficits, feels she can tolerate more activity than she can        Exercises General Exercises - Lower  Extremity Long Arc Quad: AROM;Both;10 reps;Seated    General Comments General comments (skin integrity, edema, etc.): SPO2 86 post-mobility, placed on 4LO2 via face mask for recovery to 90s      Pertinent Vitals/Pain Pain Assessment: Faces Faces Pain Scale: No hurt Pain Intervention(s): Monitored during session    Home Living                          Prior Function            PT Goals (current goals can now be found in the care plan section) Acute Rehab PT Goals Patient Stated Goal: pt spouse would like pt to be able to get to a level where he can assist her PT Goal Formulation: With patient/family Time For Goal Achievement: 08/14/21 Potential to Achieve Goals: Fair Progress towards PT goals: Progressing toward goals    Frequency    Min 3X/week (SNF vs home, pending insurance approval)      PT Plan Current plan remains appropriate    Co-evaluation              AM-PAC PT "6 Clicks" Mobility   Outcome Measure  Help needed turning from your back to your side while in a flat bed without using bedrails?: A Lot Help needed moving from lying on your back to sitting on the side of a flat bed without using bedrails?: A Lot Help needed moving to and from a bed to a chair (including a wheelchair)?: A Lot Help needed standing up from a chair using your arms (e.g., wheelchair or bedside chair)?: Total Help needed to walk in hospital room?: Total Help needed climbing 3-5 steps with a railing? : Total 6 Click Score: 9    End of Session Equipment Utilized During Treatment: Oxygen Activity Tolerance: Patient tolerated treatment well Patient left: in bed;with call bell/phone within reach;with bed alarm set Nurse Communication: Mobility status PT Visit Diagnosis: Unsteadiness on feet (R26.81);Muscle weakness (generalized) (M62.81);History of falling (Z91.81)     Time: 2633-3545 PT Time Calculation (min) (ACUTE ONLY): 35 min  Charges:  $Therapeutic Activity:  8-22 mins $Neuromuscular Re-education: 8-22 mins                    Stacie Glaze, PT DPT Acute Rehabilitation Services Pager [phone]  Office [phone]    Louis Matte 07/31/2021, 3:39 PM

## 2021-08-01 DIAGNOSIS — G0490 Encephalitis and encephalomyelitis, unspecified: Secondary | ICD-10-CM | POA: Diagnosis not present

## 2021-08-01 DIAGNOSIS — R531 Weakness: Secondary | ICD-10-CM | POA: Diagnosis not present

## 2021-08-01 DIAGNOSIS — J9601 Acute respiratory failure with hypoxia: Secondary | ICD-10-CM | POA: Diagnosis not present

## 2021-08-01 DIAGNOSIS — T83511A Infection and inflammatory reaction due to indwelling urethral catheter, initial encounter: Secondary | ICD-10-CM | POA: Diagnosis not present

## 2021-08-01 DIAGNOSIS — R69 Illness, unspecified: Secondary | ICD-10-CM | POA: Diagnosis not present

## 2021-08-01 LAB — CULTURE, BLOOD (ROUTINE X 2)
Culture: NO GROWTH
Culture: NO GROWTH

## 2021-08-01 MED ORDER — HYDROCODONE-ACETAMINOPHEN 10-325 MG PO TABS: 1.0000 | ORAL_TABLET | Freq: Four times a day (QID) | ORAL | 0 refills | Status: DC | PRN

## 2021-08-01 MED ORDER — OXYCODONE HCL 5 MG PO TABS
5.0000 mg | ORAL_TABLET | Freq: Once | ORAL | Status: AC
  Administered 2021-08-01: 5 mg via ORAL

## 2021-08-01 MED ORDER — DULOXETINE HCL 60 MG PO CPEP: 60.0000 mg | ORAL_CAPSULE | Freq: Every day | ORAL | Status: DC

## 2021-08-01 MED ORDER — HYDROMORPHONE HCL 1 MG/ML IJ SOLN
INTRAMUSCULAR | Status: AC
  Filled 2021-08-01: qty 0.5

## 2021-08-01 MED ORDER — PREDNISONE 10 MG PO TABS: ORAL_TABLET | ORAL | 0 refills | Status: DC

## 2021-08-01 MED ORDER — DIAZEPAM 5 MG PO TABS: 5.0000 mg | ORAL_TABLET | Freq: Two times a day (BID) | ORAL | 0 refills | Status: DC

## 2021-08-01 MED ORDER — FOSFOMYCIN TROMETHAMINE 3 G PO PACK
3.0000 g | PACK | Freq: Once | ORAL | Status: AC
  Administered 2021-08-01: 3 g via ORAL
  Filled 2021-08-01: qty 3

## 2021-08-01 MED ORDER — SALINE SPRAY 0.65 % NA SOLN: 1.0000 | NASAL | 0 refills | Status: DC | PRN

## 2021-08-01 MED ORDER — OXYCODONE HCL 5 MG PO TABS
ORAL_TABLET | ORAL | Status: AC
  Filled 2021-08-01: qty 1

## 2021-08-01 MED ORDER — HYDROMORPHONE HCL 1 MG/ML IJ SOLN: 0.5000 mg | Freq: Once | INTRAMUSCULAR | Status: DC

## 2021-08-01 NOTE — TOC Progression Note (Signed)
Transition of Care [facility]) - Progression Note    Patient Details  Name: Katelyn Lamb MRN: [id number] Date of Birth: July 18, 1946  Transition [facility]) CM/SW Contact  Vinie Sill, [place] Phone Number: 08/01/2021, 1:44 PM  Clinical Narrative:     Received notice/phone call from [employer](Tammy) -informed [employer] denied SNF approval - Informed Dr. Darrick Meigs- contact Dr. Amalia Hailey [phone].   Thurmond Butts, MSW, LCSW Clinical Social Worker     Expected Discharge Plan: Skilled Nursing Facility Barriers to Discharge: Continued Medical Work up  Expected Discharge Plan and Services Expected Discharge Plan: [place]   Discharge Planning Services: CM Consult Post Acute Care Choice: [place] Living arrangements for the past 2 months: Single Family Home                                       Social Determinants of Health (SDOH) Interventions    Readmission Risk Interventions No flowsheet data found.

## 2021-08-01 NOTE — Progress Notes (Signed)
ID Pharmacy Note  Spoke with Dr. Darrick Meigs- Katelyn Lamb lost IV access and has not had a new IV line placed. Will change meropenem to a 1 time dose of fosfomycin to complete therapy for a UTI.    [name], PharmD, BCPS, BCIDP Infectious Diseases Clinical Pharmacist Phone: [phone] 08/01/2021

## 2021-08-01 NOTE — TOC Progression Note (Addendum)
Transition of Care [facility]) - Progression Note    Patient Details  Name: Katelyn Lamb MRN: [id number] Date of Birth: 1946-08-31  Transition of Care [facility]) CM/SW [place], [place] Phone Number: 08/01/2021, 3:43 PM  Clinical Narrative:     Update: received notice from Dr. Elza Rafter to peer was complete - patient has been approved for 7 days- CSW informed patient's spouse. He will reconsider SNF choices and call CSW back tomorrow.  Informed spouse of denial and peer to peer request-     Expected Discharge Plan: Skilled Nursing Facility Barriers to Discharge: Continued Medical Work up  Expected Discharge Plan and Services Expected Discharge Plan: [place]   Discharge Planning Services: CM Consult Post Acute Care Choice: [place] Living arrangements for the past 2 months: Single Family Home                                       Social Determinants of Health (SDOH) Interventions    Readmission Risk Interventions No flowsheet data found.

## 2021-08-01 NOTE — Discharge Summary (Addendum)
Physician Discharge Summary  JOSHUA ZERINGUE UYQ:[id number] DOB: 1946/07/12 DOA: 07/14/2021  PCP: [name], MD  Admit date: 07/14/2021 Discharge date: 08/02/2021  Time spent: 60 minutes  Recommendations for Outpatient Follow-up:  Follow-up with ENT Dr. [name] in 2 weeks Palliative care services as outpatient   Discharge Diagnoses:  Principal Problem:   Urinary tract infection associated with catheterization of urinary tract, initial encounter [facility]) Active Problems:   Incomplete paraplegia ([place])   Chronic pain syndrome   Acute respiratory failure with hypoxia ([place])   Generalized weakness   History of ESBL E. coli infection   History of ESBL Klebsiella pneumoniae infection   Asymptomatic bacteriuria   Epistaxis s/p embolization 07-18-2021   Platelet inhibition due to Plavix   Discharge Condition: Stable  Diet recommendation: Regular diet  Filed Weights   07/28/21 0608 07/30/21 0432 07/31/21 0500  Weight: 77.2 kg 81.7 kg 81 kg    History of present illness:  75 year old female with a history of encephalomyelitis and transverse colitis HSV 5 years ago prior to admission, neurogenic bladder with suprapubic catheter, incomplete paraplegia, recurrent UTIs was admitted on 07/14/2021 due to weakness and unable to ambulate due to UTI.  Was started on meropenem.  ID was consulted on 07/15/2021 and antibiotics were held as it was thought to be due to colonization.  Unfortunately she developed epistaxis from left nare, Rhino Rocket was placed that was unsuccessful.  ENT was consulted to control the bleeding which initially was unsuccessful, patient had to be intubated for interventional procedure with embolization of the left sphenopalatine artery on 07/18/2021.  She was successfully extubated on 07/19/2021.  She was given Kcentra on 07/18/2021  Hospital Course:   Sepsis -Patient was tachycardic, febrile with temperature 101, hypotensive, also requiring 10 L/min of oxygen via face tent  on 07/27/2021 -Sepsis work-up was initiated, lactic acid was 1.3, procalcitonin 19.0; today repeat procalcitonin is less than 0.10 -Patient responded to 500 cc LR bolus x1 -Sepsis physiology has resolved -Blood cultures x2, urine culture obtained; no growth till date -Urine culture grew 100,000 colonies of Enterococcus faecalis and 70,000 colonies of Klebsiella pneumoniae -She was started on vancomycin and meropenem -We will switch vancomycin to amoxicillin, continue meropenem; continue treatment for total 7 days -Patient takes prednisone 5 mg daily at home, prednisone was discontinued and patient started on Solucortef  100 mg IV every 8 hours for stress dose steroids. -Solucortef was discontinued and patient started on prednisone 40 mg daily to be tapered over next 5 days and then she can continue taking her prednisone 5 mg p.o. daily -Chest x-ray was unremarkable -Blood cultures x2 are negative to date   Acute epistaxis of the left nare in the setting of anticoagulation with Xarelto -S/p embolization of the left sphenopalatine artery per IR -she has been on Xarelto ,she is not a candidate for anticoagulation -We will follow-up with the ENT and ENT will dictate when to start anticoagulation -She received 1 unit of PRBC, hemoglobin has been stable -Start nasal saline Ocean Spray as needed -Follow-up with ENT as outpatient   Acute respiratory failure with hypoxemia -Intubated for airway protection -She required face tent with 5 L/min -Oxygen has been weaned off   History of transverse myelitis -Patient has incomplete paraplegia -She had declining physical strength recently -PT recommended rehab at skilled nursing facility -Insurance is approved going to skilled nursing facility for rehab for 7 days -Husband is going to discuss with family to go to skilled nursing facility versus going home  Urinary retention -Patient has suprapubic catheter in place which has been leaking -Called  and discussed with urologist Dr. Abner Greenspan, he recommends catheter to be changed per RN. -Suprapubic catheter was replaced yesterday   History of DVT -Xarelto on hold due to above -She has had 2 episodes of DVT in the past  -She will follow up with the ENT to discuss when to restart taking Xarelto   Delirium -Resolved     Goals of care -She was seen by palliative care, will get palliative care services as outpatient.    Procedures:   Consultations: ENT  Discharge Exam: Vitals:   08/01/21 1123 08/01/21 1535  BP: 115/73 117/76  Pulse: 98 100  Resp: 14   Temp: 97.9 F (36.6 C) 97.7 F (36.5 C)  SpO2: 93% 91%    General: Appears in no acute distress Cardiovascular: S1-S2, regular, no murmur auscultated Respiratory: Clear to auscultation bilaterally  Discharge Instructions   Discharge Instructions     Diet - low sodium heart healthy   Complete by: As directed    Increase activity slowly   Complete by: As directed       Allergies as of 08/01/2021       Reactions   Demerol [meperidine] Other (See Comments)   Hallucinations   Percocet [oxycodone-acetaminophen] Itching   Amoxicillin-pot Clavulanate Diarrhea   Severe pain, headache, intestinal infection   Penicillins Itching, Rash   Tolerated amoxicillin November 2022  Has patient had a PCN reaction causing immediate rash, facial/tongue/throat swelling, SOB or lightheadedness with hypotension:  NO Has patient had a PCN reaction causing severe rash involving mucus membranes or skin necrosis: No Has patient had a PCN reaction that required hospitalization: No Has patient had a PCN reaction occurring within the last 10 years: Yes If all of the above answers are "NO", then may proceed with Cephalosporin use.        Medication List     STOP taking these medications    naproxen sodium 220 MG tablet Commonly known as: ALEVE   rivaroxaban 20 MG Tabs tablet Commonly known as: XARELTO       TAKE these  medications    acyclovir 400 MG tablet Commonly known as: ZOVIRAX TAKE 1 TABLET BY MOUTH TWICE A DAY   CALCIUM 600 + D PO Take 1 tablet by mouth in the morning and at bedtime.   CeraVe Crea Apply 1 application topically daily as needed (bruise spots).   diazepam 5 MG tablet Commonly known as: VALIUM Take 1 tablet (5 mg total) by mouth in the morning and at bedtime. For tremors   diclofenac Sodium 1 % Gel Commonly known as: VOLTAREN Apply 1 application topically 2 (two) times daily as needed (nerve pain).   docusate sodium 100 MG capsule Commonly known as: COLACE Take 200 mg by mouth daily as needed (constipation).   DULoxetine 30 MG capsule Commonly known as: CYMBALTA Take 30 mg by mouth in the morning.   DULoxetine 60 MG capsule Commonly known as: CYMBALTA Take 1 capsule (60 mg total) by mouth at bedtime.   ferrous sulfate 325 (65 FE) MG tablet Take 325 mg by mouth every morning.   Hair/Skin/Nails/Biotin Tabs Take 1 tablet by mouth every morning.   HYDROcodone-acetaminophen 10-325 MG tablet Commonly known as: NORCO Take 1 tablet by mouth every 6 (six) hours as needed. What changed: reasons to take this   levETIRAcetam 250 MG tablet Commonly known as: KEPPRA TAKE 1 TABLET BY MOUTH TWICE A DAY  mirabegron ER 50 MG Tb24 tablet Commonly known as: MYRBETRIQ Take 50 mg by mouth at bedtime.   omega-3 acid ethyl esters 1 g capsule Commonly known as: LOVAZA Take 1 g by mouth every morning.   pantoprazole 40 MG tablet Commonly known as: PROTONIX Take 1 tablet (40 mg total) by mouth daily. What changed: when to take this   polyethylene glycol 17 g packet Commonly known as: MIRALAX / GLYCOLAX Take 17 g by mouth daily. What changed:  when to take this reasons to take this   predniSONE 5 MG tablet Commonly known as: DELTASONE TAKE 1 TABLET BY MOUTH EVERY DAY WITH BREAKFAST What changed: See the new instructions.   predniSONE 10 MG tablet Commonly known as:  DELTASONE Prednisone 40 mg po daily x 1 day then Prednisone 30 mg po daily x 1 day then Prednisone 20 mg po daily x 1 day then Prednisone 10 mg daily x 1 day then Prednisone 5 mg po daily as your usual dose What changed: You were already taking a medication with the same name, and this prescription was added. Make sure you understand how and when to take each.   pregabalin 50 MG capsule Commonly known as: LYRICA Take 2 capsules (100 mg total) by mouth at bedtime.   QUEtiapine 100 MG tablet Commonly known as: SEROQUEL Take 100 mg by mouth at bedtime.   sodium chloride 0.65 % Soln nasal spray Commonly known as: OCEAN Place 1 spray into both nostrils as needed for congestion.   VITAMIN B-12 PO Take 1 tablet by mouth daily.   Vitamin C 500 MG Chew Chew 500 mg by mouth daily.       Allergies  Allergen Reactions   Demerol [Meperidine] Other (See Comments)    Hallucinations   Percocet [Oxycodone-Acetaminophen] Itching   Amoxicillin-Pot Clavulanate Diarrhea    Severe pain, headache, intestinal infection   Penicillins Itching and Rash    Tolerated amoxicillin November 2022  Has patient had a PCN reaction causing immediate rash, facial/tongue/throat swelling, SOB or lightheadedness with hypotension:  NO Has patient had a PCN reaction causing severe rash involving mucus membranes or skin necrosis: No Has patient had a PCN reaction that required hospitalization: No Has patient had a PCN reaction occurring within the last 10 years: Yes If all of the above answers are "NO", then may proceed with Cephalosporin use.    Follow-up Information     Melissa Montane, MD Follow up in 2 week(s).   Specialty: Otolaryngology Contact information: [address] [place] [address] [phone]                  The results of significant diagnostics from this hospitalization (including imaging, microbiology, ancillary and laboratory) are listed below for reference.     Significant Diagnostic Studies: DG Chest 2 View  Result Date: 07/14/2021 CLINICAL DATA:  Generalized weakness EXAM: CHEST - 2 VIEW COMPARISON:  03/15/2021 FINDINGS: Lungs are well expanded, symmetric, and clear. No pneumothorax or pleural effusion. Cardiac size within normal limits. Pulmonary vascularity is normal. Osseous structures are age-appropriate. No acute bone abnormality. IMPRESSION: No active cardiopulmonary disease. Electronically Signed   By: Fidela Salisbury M.D.   On: 07/14/2021 20:25   DG Thoracic Spine 2 View  Result Date: 07/14/2021 CLINICAL DATA:  Generalized weakness, back pain, recent fall EXAM: THORACIC SPINE 2 VIEWS COMPARISON:  None. FINDINGS: Normal thoracic kyphosis. No acute fracture or listhesis of the thoracic spine. Vertebral body height is preserved. Paraspinal  soft tissues are unremarkable. L2 kyphoplasty incidentally noted. Degenerative changes are noted at C3-C6,. IMPRESSION: No acute fracture or listhesis. Electronically Signed   By: Fidela Salisbury M.D.   On: 07/14/2021 20:32   DG Lumbar Spine Complete  Result Date: 07/14/2021 CLINICAL DATA:  Weakness, recent fall, back pain EXAM: LUMBAR SPINE - COMPLETE 4+ VIEW COMPARISON:  12/19/2017 FINDINGS: Normal lumbar lordosis. 7 mm anterolisthesis of L4 upon L5 is unchanged. Interval kyphoplasty of L2 and L5. There are superior endplate fractures of L3 and L4, new since prior examination, with roughly 20-30% loss of height of the L4 vertebral body. These appear chronic in nature. No acute fracture of the lumbar spine. Stable intervertebral disc space narrowing and endplate remodeling at E83-T5 in keeping with changes of a mild to moderate degenerative disc disease. Vascular calcifications are seen within the abdominal aorta. IMPRESSION: No acute fracture or listhesis of the a lumbar spine. Interval vertebral augmentation of L2 and L5. Interval development of remote appearing fractures of L3 and L4 with approximately 20-30%  loss of height of L4. Electronically Signed   By: Fidela Salisbury M.D.   On: 07/14/2021 20:30   DG Sacrum/Coccyx  Result Date: 07/14/2021 CLINICAL DATA:  Generalized weakness, pelvic pain, recent fall EXAM: SACRUM AND COCCYX - 2+ VIEW COMPARISON:  None. FINDINGS: The osseous structures are diffusely osteopenic. No acute fracture or dislocation. L5 kyphoplasty has been performed. L3 and L4 vertebral bodies demonstrate loss of height, not well profiled on this examination. The soft tissues are unremarkable. IMPRESSION: No acute fracture or dislocation. Electronically Signed   By: Fidela Salisbury M.D.   On: 07/14/2021 20:27   CT HEAD WO CONTRAST (5MM)  Result Date: 07/14/2021 CLINICAL DATA:  Weakness and difficulty walking. Worse today. Chronic pain all over. Fall 2 days ago. EXAM: CT HEAD WITHOUT CONTRAST CT CERVICAL SPINE WITHOUT CONTRAST TECHNIQUE: Multidetector CT imaging of the head and cervical spine was performed following the standard protocol without intravenous contrast. Multiplanar CT image reconstructions of the cervical spine were also generated. COMPARISON:  None. FINDINGS: CT HEAD FINDINGS BRAIN: BRAIN Cerebral ventricle sizes are concordant with the degree of cerebral volume loss. Patchy and confluent areas of decreased attenuation are noted throughout the deep and periventricular white matter of the cerebral hemispheres bilaterally, compatible with chronic microvascular ischemic disease. No evidence of large-territorial acute infarction. No parenchymal hemorrhage. No mass lesion. No extra-axial collection. No mass effect or midline shift. No hydrocephalus. Basilar cisterns are patent. Vascular: No hyperdense vessel. Skull: No acute fracture or focal lesion. Sinuses/Orbits: Partial effusion of the right mastoid air cells. No fluid within the right middle ear. Paranasal sinuses and left mastoid air cells are clear. Bilateral lens replacement. Otherwise orbits are unremarkable. Other: None. CT  CERVICAL SPINE FINDINGS Alignment: Normal. Skull base and vertebrae: Multilevel degenerative changes of the spine. No associated severe osseous neural foraminal or central canal stenosis. No acute fracture. No aggressive appearing focal osseous lesion or focal pathologic process. Soft tissues and spinal canal: No prevertebral fluid or swelling. No visible canal hematoma. Upper chest: Unremarkable. Other: None. IMPRESSION: 1. No acute intracranial abnormality. 2. No acute displaced fracture or traumatic listhesis of the cervical spine. 3. Partial fusion of the right mastoid air cells. Electronically Signed   By: Iven Finn M.D.   On: 07/14/2021 21:52   CT Cervical Spine Wo Contrast  Result Date: 07/14/2021 CLINICAL DATA:  Weakness and difficulty walking. Worse today. Chronic pain all over. Fall 2 days ago. EXAM: CT  HEAD WITHOUT CONTRAST CT CERVICAL SPINE WITHOUT CONTRAST TECHNIQUE: Multidetector CT imaging of the head and cervical spine was performed following the standard protocol without intravenous contrast. Multiplanar CT image reconstructions of the cervical spine were also generated. COMPARISON:  None. FINDINGS: CT HEAD FINDINGS BRAIN: BRAIN Cerebral ventricle sizes are concordant with the degree of cerebral volume loss. Patchy and confluent areas of decreased attenuation are noted throughout the deep and periventricular white matter of the cerebral hemispheres bilaterally, compatible with chronic microvascular ischemic disease. No evidence of large-territorial acute infarction. No parenchymal hemorrhage. No mass lesion. No extra-axial collection. No mass effect or midline shift. No hydrocephalus. Basilar cisterns are patent. Vascular: No hyperdense vessel. Skull: No acute fracture or focal lesion. Sinuses/Orbits: Partial effusion of the right mastoid air cells. No fluid within the right middle ear. Paranasal sinuses and left mastoid air cells are clear. Bilateral lens replacement. Otherwise orbits  are unremarkable. Other: None. CT CERVICAL SPINE FINDINGS Alignment: Normal. Skull base and vertebrae: Multilevel degenerative changes of the spine. No associated severe osseous neural foraminal or central canal stenosis. No acute fracture. No aggressive appearing focal osseous lesion or focal pathologic process. Soft tissues and spinal canal: No prevertebral fluid or swelling. No visible canal hematoma. Upper chest: Unremarkable. Other: None. IMPRESSION: 1. No acute intracranial abnormality. 2. No acute displaced fracture or traumatic listhesis of the cervical spine. 3. Partial fusion of the right mastoid air cells. Electronically Signed   By: Iven Finn M.D.   On: 07/14/2021 21:52   MR THORACIC SPINE W WO CONTRAST  Result Date: 07/15/2021 CLINICAL DATA:  Follow-up spinal cord injury EXAM: MRI THORACIC AND LUMBAR SPINE WITHOUT AND WITH CONTRAST TECHNIQUE: Multiplanar and multiecho pulse sequences of the thoracic and lumbar spine were obtained without and with intravenous contrast. CONTRAST:  28m GADAVIST GADOBUTROL 1 MMOL/ML IV SOLN COMPARISON:  Thoracic MRI 03/15/2021 FINDINGS: MRI THORACIC SPINE FINDINGS Alignment:  Normal Vertebrae: Horizontal fracture through the T12 vertebral body without marrow edema, interval. No acute fracture. Hemangiomas seen at T2 and L1. Cord: The central canal is faintly visible intermittently in the upper thoracic cord, within normal limits. No cord edema or swelling noted. Paraspinal and other soft tissues: Negative Disc levels: Central disc protrusion on a chronic basis at T5-6. Disc narrowing and bulging at T11-12 and T12-L1. Negative facets. MRI LUMBAR SPINE FINDINGS Segmentation:  5 lumbar type vertebrae Alignment:  Grade 1 anterolisthesis at L4-5 Vertebrae: Remote compression fractures of L2-L5 with cement augmentation at L2 and L5. No acute fracture, discitis, or aggressive bone lesion Conus medullaris: Extends to the L2 level and appears normal. Paraspinal and other  soft tissues: Fatty atrophy of intrinsic back muscles Disc levels: T12- L1: Disc narrowing with shallow central protrusion. L1-L2: Disc narrowing and left paracentral protrusion at an annular fissure. L2-L3: Disc narrowing and mild bilateral foraminal bulging L3-L4: Disc narrowing and bulging with mild facet spurring. Mild left foraminal narrowing L4-L5: Facet osteoarthritis with spurring and anterolisthesis. The disc is mildly narrowed with circumferential bulging. Moderate bilateral foraminal narrowing L5-S1:Disc narrowing and left eccentric bulging at with an annular fissure. Facet spurring. IMPRESSION: Thoracic MRI: 1. Interval but healed T12 compression fracture since 03/15/2021, with mild height loss. 2. Unremarkable appearance of the cord today. Lumbar MRI: 1. No acute finding or neural compression. 2. Multiple remote compression fractures which have healed. 3. Degeneration most notable at L4-5 where there is anterolisthesis and moderate bilateral foraminal narrowing. Electronically Signed   By: JJorje GuildM.D.   On: 07/15/2021  05:00   MR Lumbar Spine W Wo Contrast  Result Date: 07/15/2021 CLINICAL DATA:  Follow-up spinal cord injury EXAM: MRI THORACIC AND LUMBAR SPINE WITHOUT AND WITH CONTRAST TECHNIQUE: Multiplanar and multiecho pulse sequences of the thoracic and lumbar spine were obtained without and with intravenous contrast. CONTRAST:  90m GADAVIST GADOBUTROL 1 MMOL/ML IV SOLN COMPARISON:  Thoracic MRI 03/15/2021 FINDINGS: MRI THORACIC SPINE FINDINGS Alignment:  Normal Vertebrae: Horizontal fracture through the T12 vertebral body without marrow edema, interval. No acute fracture. Hemangiomas seen at T2 and L1. Cord: The central canal is faintly visible intermittently in the upper thoracic cord, within normal limits. No cord edema or swelling noted. Paraspinal and other soft tissues: Negative Disc levels: Central disc protrusion on a chronic basis at T5-6. Disc narrowing and bulging at T11-12  and T12-L1. Negative facets. MRI LUMBAR SPINE FINDINGS Segmentation:  5 lumbar type vertebrae Alignment:  Grade 1 anterolisthesis at L4-5 Vertebrae: Remote compression fractures of L2-L5 with cement augmentation at L2 and L5. No acute fracture, discitis, or aggressive bone lesion Conus medullaris: Extends to the L2 level and appears normal. Paraspinal and other soft tissues: Fatty atrophy of intrinsic back muscles Disc levels: T12- L1: Disc narrowing with shallow central protrusion. L1-L2: Disc narrowing and left paracentral protrusion at an annular fissure. L2-L3: Disc narrowing and mild bilateral foraminal bulging L3-L4: Disc narrowing and bulging with mild facet spurring. Mild left foraminal narrowing L4-L5: Facet osteoarthritis with spurring and anterolisthesis. The disc is mildly narrowed with circumferential bulging. Moderate bilateral foraminal narrowing L5-S1:Disc narrowing and left eccentric bulging at with an annular fissure. Facet spurring. IMPRESSION: Thoracic MRI: 1. Interval but healed T12 compression fracture since 03/15/2021, with mild height loss. 2. Unremarkable appearance of the cord today. Lumbar MRI: 1. No acute finding or neural compression. 2. Multiple remote compression fractures which have healed. 3. Degeneration most notable at L4-5 where there is anterolisthesis and moderate bilateral foraminal narrowing. Electronically Signed   By: JJorje GuildM.D.   On: 07/15/2021 05:00   IR Transcath/Emboliz  Result Date: 07/18/2021 INDICATION: 75year old female with past medical history significant for encephalomyelitis and transverse myelitis due to HSV 5 years ago, neurogenic bladder s/p suprapubic catheter, frequent UTI's, DVT's on Xarelto. [facility]. Patient developed acute bleeding of the left nare on 11.14.22 at 22:30. Rhinorocket placed for hemostasis was unsuccessful. 8 cm Merocel sponge and Afrin attempted by ENT  also unsuccessful. ENT placed a Epistat catheter with 19 ml balloon. She comes to our service for a cerebral angiogram with endovascular epistaxis embolization. EXAM: ULTRASOUND-GUIDED VASCULAR ACCESS DIAGNOSTIC CEREBRAL ANGIOGRAM VASCULAR EMBOLIZATION OF BILATERAL SPHENOPALATINE ARTERIES COMPARISON:  None. MEDICATIONS: Ancef 2 gm IV. The antibiotic was administered within 1 hour of the procedure. ANESTHESIA/SEDATION: The procedure was performed under general anesthesia. CONTRAST:  170 mL of Omnipaque 300 milligram/mL FLUOROSCOPY TIME:  Fluoroscopy Time: 45 minutes 42 seconds (1979 mGy). COMPLICATIONS: None immediate. TECHNIQUE: Informed written consent was obtained from the patient and her husband after a thorough discussion of the procedural risks, benefits and alternatives. All questions were addressed. Maximal Sterile Barrier Technique was utilized including caps, mask, sterile gowns, sterile gloves, sterile drape, hand hygiene and skin antiseptic. A timeout was performed prior to the initiation of the procedure. The right groin was prepped and draped in the usual sterile fashion. Using a micropuncture kit and the modified Seldinger technique, access was gained to the right common femoral artery and an 8 French sheath was  placed. Real-time ultrasound guidance was utilized for vascular access including the acquisition of a permanent ultrasound image documenting patency of the accessed vessel. Under fluoroscopy, a 4 [place] Berenstein 2 catheter was navigated over a 0.035" Terumo Glidewire into the aortic arch. The catheter was placed into the left common carotid artery. Frontal and lateral angiograms of the neck were obtained. The catheter was then placed into the left internal carotid artery. Frontal and lateral angiograms of the head were obtained. Next, the catheter was placed into the left external carotid artery. Frontal and lateral angiograms of the neck and head/face were obtained. Left sphenopalatine  artery embolization was then carried out as described below. Next, the Envoy catheter was retracted into the aortic arch and then navigated to the right common carotid artery. Frontal and lateral angiograms of the neck were obtained. The catheter was then placed into the right internal carotid artery. Frontal and lateral angiograms of the head were obtained. Attempts to navigate the Envoy catheter into the right external carotid artery proved unsuccessful due to branch takeoff angle. The Envoy catheter was then exchanged over the wire and under biplane roadmap for a 4 [place] Berenstein catheter. Next, the catheter was placed into the right external carotid artery. Frontal and lateral angiograms of the neck and head/face were obtained. The Berenstein 2 catheter was then exchanged over the wire and under biplane roadmap for a 6 [place] envoy which was placed into into the right external carotid artery. Right sphenopalatine artery embolization was then carried out as described below. The catheter was subsequently withdrawn. A right common femoral artery angiogram was obtained in right anterior oblique view. FINDINGS: 1. Normal caliber of the right common femoral artery, adequate for vascular access and closure device utilization. 2. Prominent mucosal blush in the nasal cavity on the left with contrast pooling in the inferior and middle meatus. 3. Normal appearing right nasal cavity mucosal blush. 4. Normal appearance of the bilateral carotid bifurcation without hemodynamically significant stenosis. 5. Brisk contrast opacification of the bilateral MCA and ACA vascular tree without significant vascular abnormality. PROCEDURE: After left-sided diagnostic angiogram, the Berenstein 2 catheter was exchanged over the wire and biplane roadmap for a 6 French envoy DA which was placed into the left external carotid artery. Magnified frontal and lateral angiograms of the face were obtained. Using biplane roadmap, a Prowler select  Plus microcatheter was navigated over a synchro 2 micro guidewire into the left sphenopalatine artery. Magnified frontal and lateral angiograms were obtained via microcatheter contrast injection. Subsequently, left sphenopalatine artery embolization was carried out with 250-350 microns PVA particles suspension with iodine contrast for opacification. Follow-up angiogram showed near complete embolization. The catheter was slightly pulled back more proximally and additional embolization with PVA particles was performed. The microcatheter was retracted under continuous aspiration. 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 The Envoy catheter was then placed into the left internal carotid artery. Frontal and lateral angiograms of the head were obtained with no evidence of nontarget embolization. Mild nasal septum blush is noted via ethmoidal branches of the left ophthalmic artery. After right-sided diagnostic angiogram, a Prowler select Plus microcatheter was navigated over a synchro 2 micro guidewire into the right sphenopalatine artery under biplane roadmap guidance. Magnified frontal and lateral angiograms were obtained via microcatheter contrast injection. Subsequently, right sphenopalatine artery embolization was carried out with 250-350 microns PVA particles suspension with iodine contrast for opacification. 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 The  Envoy catheter was then placed into the right internal carotid artery. Frontal and lateral angiograms of the head were obtained with no evidence of nontarget embolization. At the end of the procedure, the 8 French right common femoral artery sheath was exchanged over the wire for a Perclose ProGlide which was utilized for access closure. Immediate hemostasis was achieved. IMPRESSION: 1. Prominent mucosal blush in the left nasal cavity with active contrast  extravasation. 2. Successful and uncomplicated endovascular embolization of the bilateral sphenopalatine arteries with polyvinyl alcohol particles for treatment of epistaxis refractory to nasal packing. 3. Residual mucosal blush of the nasal septum via anterior ethmoidal branches of the left ophthalmic artery. PLAN: Patient will remain intubated due to aspiration risk and will be transferred to ICU for continued management. Electronically Signed   By: Pedro Earls M.D.   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 Presented to there ED at [facility] with generalized weakness, and difficulty ambulating. Patient developed acute bleeding of the left nare on 11.14.22 at 22:30. Rhinorocket placed for hemostasis was unsuccessful. 8 cm Merocel sponge and Afrin attempted by ENT also unsuccessful. ENT placed a Epistat catheter with 19 ml balloon. She comes to our service for a cerebral angiogram with endovascular epistaxis embolization. EXAM: ULTRASOUND-GUIDED VASCULAR ACCESS DIAGNOSTIC CEREBRAL ANGIOGRAM VASCULAR EMBOLIZATION OF BILATERAL SPHENOPALATINE ARTERIES COMPARISON:  None. MEDICATIONS: Ancef 2 gm IV. The antibiotic was administered within 1 hour of the procedure. ANESTHESIA/SEDATION: The procedure was performed under general anesthesia. CONTRAST:  170 mL of Omnipaque 300 milligram/mL FLUOROSCOPY TIME:  Fluoroscopy Time: 45 minutes 42 seconds (1979 mGy). COMPLICATIONS: None immediate. TECHNIQUE: Informed written consent was obtained from the patient and her husband after a thorough discussion of the procedural risks, benefits and alternatives. All questions were addressed. Maximal Sterile Barrier Technique was utilized including caps, mask, sterile gowns,  sterile gloves, sterile drape, hand hygiene and skin antiseptic. A timeout was performed prior to the initiation of the procedure. The right groin was prepped and draped in the usual sterile fashion. Using a micropuncture kit and the modified Seldinger technique, access was gained to the right common femoral artery and an 8 French sheath was placed. Real-time ultrasound guidance was utilized for vascular access including the acquisition of a permanent ultrasound image documenting patency of the accessed vessel. Under fluoroscopy, a 4 [place] Berenstein 2 catheter was navigated over a 0.035" Terumo Glidewire into the aortic arch. The catheter was placed into the left common carotid artery. Frontal and lateral angiograms of the neck were obtained. The catheter was then placed into the left internal carotid artery. Frontal and lateral angiograms of the head were obtained. Next, the catheter was placed into the left external carotid artery. Frontal and lateral angiograms of the neck and head/face were obtained. Left sphenopalatine artery embolization was then carried out as described below. Next, the Envoy catheter was retracted into the aortic arch and then navigated to the right common carotid artery. Frontal and lateral angiograms of the neck were obtained. The catheter was then placed into the right internal carotid artery. Frontal and lateral angiograms of the head were obtained. Attempts to navigate the Envoy catheter into the right external carotid artery proved unsuccessful due to branch takeoff angle. The Envoy catheter was then exchanged over the wire and under  biplane roadmap for a 4 [place] Berenstein catheter. Next, the catheter was placed into the right external carotid artery. Frontal and lateral angiograms of the neck and head/face were obtained. The Berenstein 2 catheter was then exchanged over the wire and under biplane roadmap for a 6 [place] envoy which was placed into into the right external carotid  artery. Right sphenopalatine artery embolization was then carried out as described below. The catheter was subsequently withdrawn. A right common femoral artery angiogram was obtained in right anterior oblique view. FINDINGS: 1. Normal caliber of the right common femoral artery, adequate for vascular access and closure device utilization. 2. Prominent mucosal blush in the nasal cavity on the left with contrast pooling in the inferior and middle meatus. 3. Normal appearing right nasal cavity mucosal blush. 4. Normal appearance of the bilateral carotid bifurcation without hemodynamically significant stenosis. 5. Brisk contrast opacification of the bilateral MCA and ACA vascular tree without significant vascular abnormality. PROCEDURE: After left-sided diagnostic angiogram, the Berenstein 2 catheter was exchanged over the wire and biplane roadmap for a 6 French envoy DA which was placed into the left external carotid artery. Magnified frontal and lateral angiograms of the face were obtained. Using biplane roadmap, a Prowler select Plus microcatheter was navigated over a synchro 2 micro guidewire into the left sphenopalatine artery. Magnified frontal and lateral angiograms were obtained via microcatheter contrast injection. Subsequently, left sphenopalatine artery embolization was carried out with 250-350 microns PVA particles suspension with iodine contrast for opacification. Follow-up angiogram showed near complete embolization. The catheter was slightly pulled back more proximally and additional embolization with PVA particles was performed. The microcatheter was retracted under continuous aspiration. 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 The Envoy catheter was then placed into the left internal carotid artery. Frontal and lateral angiograms of the head were obtained with no evidence of nontarget embolization. Mild nasal septum blush is noted via ethmoidal branches  of the left ophthalmic artery. After right-sided diagnostic angiogram, a Prowler select Plus microcatheter was navigated over a synchro 2 micro guidewire into the right sphenopalatine artery under biplane roadmap guidance. Magnified frontal and lateral angiograms were obtained via microcatheter contrast injection. Subsequently, right sphenopalatine artery embolization was carried out with 250-350 microns PVA particles suspension with iodine contrast for opacification. 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 The Envoy catheter was then placed into the right internal carotid artery. Frontal and lateral angiograms of the head were obtained with no evidence of nontarget embolization. At the end of the procedure, the 8 French right common femoral artery sheath was exchanged over the wire for a Perclose ProGlide which was utilized for access closure. Immediate hemostasis was achieved. IMPRESSION: 1. Prominent mucosal blush in the left nasal cavity with active contrast extravasation. 2. Successful and uncomplicated endovascular embolization of the bilateral sphenopalatine arteries with polyvinyl alcohol particles for treatment of epistaxis refractory to nasal packing. 3. Residual mucosal blush of the nasal septum via anterior ethmoidal branches of the left ophthalmic artery. PLAN: Patient will remain intubated due to aspiration risk and will be transferred to ICU for continued management. Electronically Signed   By: Pedro Earls M.D.   On: 07/18/2021 14:08   DG Chest Port 1 View  Result Date: 07/27/2021 CLINICAL DATA:  Shortness of breath. EXAM: PORTABLE CHEST 1 VIEW COMPARISON:  Chest x-ray dated July 22, 2021. FINDINGS: The patient is rotated to the left. The heart size and mediastinal contours are  within normal limits. Normal pulmonary vascularity. No focal consolidation, pleural effusion, or pneumothorax. No acute osseous abnormality.  IMPRESSION: No active disease. Electronically Signed   By: Titus Dubin M.D.   On: 07/27/2021 11:21   DG CHEST PORT 1 VIEW  Result Date: 07/22/2021 CLINICAL DATA:  Fever. EXAM: PORTABLE CHEST 1 VIEW COMPARISON:  July 19, 2021 FINDINGS: The ET and NG tubes have been removed. The heart, hila, mediastinum, lungs, and pleura are unremarkable. IMPRESSION: No active disease. Electronically Signed   By: Dorise Bullion III M.D.   On: 07/22/2021 19:06   DG Chest Port 1 View  Result Date: 07/19/2021 CLINICAL DATA:  Intubated, ventilated EXAM: PORTABLE CHEST 1 VIEW COMPARISON:  Chest radiograph dated 1 day prior FINDINGS: The endotracheal tube has been retracted and now projects approximately 4.0 cm from the carina. There is a new enteric catheter in place with the tip coursing off the field of view. The cardiomediastinal silhouette is normal. The lungs are clear, without focal consolidation or pulmonary edema. There is no pleural effusion or pneumothorax. There is no acute osseous abnormality. IMPRESSION: 1. Interval retraction of the endotracheal tube tip which is now in satisfactory position. 2. Clear lungs. Electronically Signed   By: Valetta Mole M.D.   On: 07/19/2021 08:08   DG Chest Port 1 View  Result Date: 07/18/2021 CLINICAL DATA:  Endotracheal tube placement. EXAM: PORTABLE CHEST 1 VIEW COMPARISON:  07/14/2021 FINDINGS: No 633 hours. Low lung volumes. Cardiopericardial silhouette is at upper limits of normal for size. The lungs are clear without focal pneumonia, edema, pneumothorax or pleural effusion. Endotracheal tube tip is just into the right mainstem bronchus and could be retracted 3 cm for more appropriate positioning. Bones are diffusely demineralized. Telemetry leads overlie the chest. IMPRESSION: 1. Endotracheal tube tip just into the right mainstem bronchus. Recommend retracting 3 cm for more appropriate positioning. 2. Low lung volumes without acute cardiopulmonary findings. I  called these results to the patient's nurse, Loma Sousa, at approximately 0656 hours on 07/18/2021. Electronically Signed   By: Misty Stanley M.D.   On: 07/18/2021 06:57   DG Abd Portable 1V  Result Date: 07/18/2021 CLINICAL DATA:  OG tube placement EXAM: PORTABLE ABDOMEN - 1 VIEW COMPARISON:  None. FINDINGS: Enteric tube tip is in the pyloric region. Included bowel gas pattern is unremarkable. IMPRESSION: Enteric tube in pyloric region. Electronically Signed   By: Macy Mis M.D.   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 Presented to there ED at [facility] with generalized weakness, and difficulty ambulating. Patient developed acute bleeding of the left nare on 11.14.22 at 22:30. Rhinorocket placed for hemostasis was unsuccessful. 8 cm Merocel sponge and Afrin attempted by ENT also unsuccessful. ENT placed a Epistat catheter with 19 ml balloon. She comes to our service for a cerebral angiogram with endovascular epistaxis embolization. EXAM: ULTRASOUND-GUIDED VASCULAR ACCESS DIAGNOSTIC CEREBRAL ANGIOGRAM VASCULAR EMBOLIZATION OF BILATERAL SPHENOPALATINE ARTERIES COMPARISON:  None. MEDICATIONS: Ancef 2 gm IV. The antibiotic was administered within 1 hour of the procedure. ANESTHESIA/SEDATION: The procedure was performed under general anesthesia. CONTRAST:  170 mL of Omnipaque 300 milligram/mL FLUOROSCOPY TIME:  Fluoroscopy Time: 45 minutes 42 seconds (1979 mGy). COMPLICATIONS: None immediate. TECHNIQUE: Informed written consent was obtained from the patient and her  husband after a thorough discussion of the procedural risks, benefits and alternatives. All questions were addressed. Maximal Sterile Barrier Technique was utilized  including caps, mask, sterile gowns, sterile gloves, sterile drape, hand hygiene and skin antiseptic. A timeout was performed prior to the initiation of the procedure. The right groin was prepped and draped in the usual sterile fashion. Using a micropuncture kit and the modified Seldinger technique, access was gained to the right common femoral artery and an 8 French sheath was placed. Real-time ultrasound guidance was utilized for vascular access including the acquisition of a permanent ultrasound image documenting patency of the accessed vessel. Under fluoroscopy, a 4 [place] Berenstein 2 catheter was navigated over a 0.035" Terumo Glidewire into the aortic arch. The catheter was placed into the left common carotid artery. Frontal and lateral angiograms of the neck were obtained. The catheter was then placed into the left internal carotid artery. Frontal and lateral angiograms of the head were obtained. Next, the catheter was placed into the left external carotid artery. Frontal and lateral angiograms of the neck and head/face were obtained. Left sphenopalatine artery embolization was then carried out as described below. Next, the Envoy catheter was retracted into the aortic arch and then navigated to the right common carotid artery. Frontal and lateral angiograms of the neck were obtained. The catheter was then placed into the right internal carotid artery. Frontal and lateral angiograms of the head were obtained. Attempts to navigate the Envoy catheter into the right external carotid artery proved unsuccessful due to branch takeoff angle. The Envoy catheter was then exchanged over the wire and under biplane roadmap for a 4 [place] Berenstein catheter. Next, the catheter was placed into the right external carotid artery. Frontal and lateral angiograms of the neck and head/face were obtained. The Berenstein 2 catheter was then exchanged over the wire and under biplane roadmap for a 6 [place] envoy which was placed  into into the right external carotid artery. Right sphenopalatine artery embolization was then carried out as described below. The catheter was subsequently withdrawn. A right common femoral artery angiogram was obtained in right anterior oblique view. FINDINGS: 1. Normal caliber of the right common femoral artery, adequate for vascular access and closure device utilization. 2. Prominent mucosal blush in the nasal cavity on the left with contrast pooling in the inferior and middle meatus. 3. Normal appearing right nasal cavity mucosal blush. 4. Normal appearance of the bilateral carotid bifurcation without hemodynamically significant stenosis. 5. Brisk contrast opacification of the bilateral MCA and ACA vascular tree without significant vascular abnormality. PROCEDURE: After left-sided diagnostic angiogram, the Berenstein 2 catheter was exchanged over the wire and biplane roadmap for a 6 French envoy DA which was placed into the left external carotid artery. Magnified frontal and lateral angiograms of the face were obtained. Using biplane roadmap, a Prowler select Plus microcatheter was navigated over a synchro 2 micro guidewire into the left sphenopalatine artery. Magnified frontal and lateral angiograms were obtained via microcatheter contrast injection. Subsequently, left sphenopalatine artery embolization was carried out with 250-350 microns PVA particles suspension with iodine contrast for opacification. Follow-up angiogram showed near complete embolization. The catheter was slightly pulled back more proximally and additional embolization with PVA particles was performed. The microcatheter was retracted under continuous aspiration. 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 The Envoy catheter was then placed into the left internal carotid artery. Frontal and lateral angiograms of the head were obtained with no evidence of nontarget embolization.  Mild nasal septum  blush is noted via ethmoidal branches of the left ophthalmic artery. After right-sided diagnostic angiogram, a Prowler select Plus microcatheter was navigated over a synchro 2 micro guidewire into the right sphenopalatine artery under biplane roadmap guidance. Magnified frontal and lateral angiograms were obtained via microcatheter contrast injection. Subsequently, right sphenopalatine artery embolization was carried out with 250-350 microns PVA particles suspension with iodine contrast for opacification. 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 The Envoy catheter was then placed into the right internal carotid artery. Frontal and lateral angiograms of the head were obtained with no evidence of nontarget embolization. At the end of the procedure, the 8 French right common femoral artery sheath was exchanged over the wire for a Perclose ProGlide which was utilized for access closure. Immediate hemostasis was achieved. IMPRESSION: 1. Prominent mucosal blush in the left nasal cavity with active contrast extravasation. 2. Successful and uncomplicated endovascular embolization of the bilateral sphenopalatine arteries with polyvinyl alcohol particles for treatment of epistaxis refractory to nasal packing. 3. Residual mucosal blush of the nasal septum via anterior ethmoidal branches of the left ophthalmic artery. PLAN: Patient will remain intubated due to aspiration risk and will be transferred to ICU for continued management. Electronically Signed   By: Pedro Earls M.D.   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 Presented to there ED at [facility] with generalized weakness, and  difficulty ambulating. Patient developed acute bleeding of the left nare on 11.14.22 at 22:30. Rhinorocket placed for hemostasis was unsuccessful. 8 cm Merocel sponge and Afrin attempted by ENT also unsuccessful. ENT placed a Epistat catheter with 19 ml balloon. She comes to our service for a cerebral angiogram with endovascular epistaxis embolization. EXAM: ULTRASOUND-GUIDED VASCULAR ACCESS DIAGNOSTIC CEREBRAL ANGIOGRAM VASCULAR EMBOLIZATION OF BILATERAL SPHENOPALATINE ARTERIES COMPARISON:  None. MEDICATIONS: Ancef 2 gm IV. The antibiotic was administered within 1 hour of the procedure. ANESTHESIA/SEDATION: The procedure was performed under general anesthesia. CONTRAST:  170 mL of Omnipaque 300 milligram/mL FLUOROSCOPY TIME:  Fluoroscopy Time: 45 minutes 42 seconds (1979 mGy). COMPLICATIONS: None immediate. TECHNIQUE: Informed written consent was obtained from the patient and her husband after a thorough discussion of the procedural risks, benefits and alternatives. All questions were addressed. Maximal Sterile Barrier Technique was utilized including caps, mask, sterile gowns, sterile gloves, sterile drape, hand hygiene and skin antiseptic. A timeout was performed prior to the initiation of the procedure. The right groin was prepped and draped in the usual sterile fashion. Using a micropuncture kit and the modified Seldinger technique, access was gained to the right common femoral artery and an 8 French sheath was placed. Real-time ultrasound guidance was utilized for vascular access including the acquisition of a permanent ultrasound image documenting patency of the accessed vessel. Under fluoroscopy, a 4 [place] Berenstein 2 catheter was navigated over a 0.035" Terumo Glidewire into the aortic arch. The catheter was placed into the left common carotid artery. Frontal and lateral angiograms of the neck were obtained. The catheter was then placed into the left internal carotid artery. Frontal and lateral angiograms  of the head were obtained. Next, the catheter was placed into the left external carotid artery. Frontal and lateral angiograms of the neck and head/face were obtained. Left  sphenopalatine artery embolization was then carried out as described below. Next, the Envoy catheter was retracted into the aortic arch and then navigated to the right common carotid artery. Frontal and lateral angiograms of the neck were obtained. The catheter was then placed into the right internal carotid artery. Frontal and lateral angiograms of the head were obtained. Attempts to navigate the Envoy catheter into the right external carotid artery proved unsuccessful due to branch takeoff angle. The Envoy catheter was then exchanged over the wire and under biplane roadmap for a 4 [place] Berenstein catheter. Next, the catheter was placed into the right external carotid artery. Frontal and lateral angiograms of the neck and head/face were obtained. The Berenstein 2 catheter was then exchanged over the wire and under biplane roadmap for a 6 [place] envoy which was placed into into the right external carotid artery. Right sphenopalatine artery embolization was then carried out as described below. The catheter was subsequently withdrawn. A right common femoral artery angiogram was obtained in right anterior oblique view. FINDINGS: 1. Normal caliber of the right common femoral artery, adequate for vascular access and closure device utilization. 2. Prominent mucosal blush in the nasal cavity on the left with contrast pooling in the inferior and middle meatus. 3. Normal appearing right nasal cavity mucosal blush. 4. Normal appearance of the bilateral carotid bifurcation without hemodynamically significant stenosis. 5. Brisk contrast opacification of the bilateral MCA and ACA vascular tree without significant vascular abnormality. PROCEDURE: After left-sided diagnostic angiogram, the Berenstein 2 catheter was exchanged over the wire and biplane roadmap  for a 6 French envoy DA which was placed into the left external carotid artery. Magnified frontal and lateral angiograms of the face were obtained. Using biplane roadmap, a Prowler select Plus microcatheter was navigated over a synchro 2 micro guidewire into the left sphenopalatine artery. Magnified frontal and lateral angiograms were obtained via microcatheter contrast injection. Subsequently, left sphenopalatine artery embolization was carried out with 250-350 microns PVA particles suspension with iodine contrast for opacification. Follow-up angiogram showed near complete embolization. The catheter was slightly pulled back more proximally and additional embolization with PVA particles was performed. The microcatheter was retracted under continuous aspiration. 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 The Envoy catheter was then placed into the left internal carotid artery. Frontal and lateral angiograms of the head were obtained with no evidence of nontarget embolization. Mild nasal septum blush is noted via ethmoidal branches of the left ophthalmic artery. After right-sided diagnostic angiogram, a Prowler select Plus microcatheter was navigated over a synchro 2 micro guidewire into the right sphenopalatine artery under biplane roadmap guidance. Magnified frontal and lateral angiograms were obtained via microcatheter contrast injection. Subsequently, right sphenopalatine artery embolization was carried out with 250-350 microns PVA particles suspension with iodine contrast for opacification. 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 The Envoy catheter was then placed into the right internal carotid artery. Frontal and lateral angiograms of the head were obtained with no evidence of nontarget embolization. At the end of the procedure, the 8 French right common femoral artery sheath was exchanged over the wire for a  Perclose ProGlide which was utilized for access closure. Immediate hemostasis was achieved. IMPRESSION: 1. Prominent mucosal blush in the left nasal cavity with active contrast extravasation. 2. Successful and uncomplicated endovascular embolization of the bilateral sphenopalatine arteries with polyvinyl alcohol particles for treatment of epistaxis refractory to nasal packing. 3. Residual mucosal blush of the nasal septum  via anterior ethmoidal branches of the left ophthalmic artery. PLAN: Patient will remain intubated due to aspiration risk and will be transferred to ICU for continued management. Electronically Signed   By: Pedro Earls M.D.   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 Presented to there ED at [facility] with generalized weakness, and difficulty ambulating. Patient developed acute bleeding of the left nare on 11.14.22 at 22:30. Rhinorocket placed for hemostasis was unsuccessful. 8 cm Merocel sponge and Afrin attempted by ENT also unsuccessful. ENT placed a Epistat catheter with 19 ml balloon. She comes to our service for a cerebral angiogram with endovascular epistaxis embolization. EXAM: ULTRASOUND-GUIDED VASCULAR ACCESS DIAGNOSTIC CEREBRAL ANGIOGRAM VASCULAR EMBOLIZATION OF BILATERAL SPHENOPALATINE ARTERIES COMPARISON:  None. MEDICATIONS: Ancef 2 gm IV. The antibiotic was administered within 1 hour of the procedure. ANESTHESIA/SEDATION: The procedure was performed under general anesthesia. CONTRAST:  170 mL of Omnipaque 300 milligram/mL FLUOROSCOPY TIME:  Fluoroscopy Time: 45 minutes 42 seconds (1979 mGy). COMPLICATIONS: None immediate. TECHNIQUE: Informed written consent was obtained from the patient and her husband  after a thorough discussion of the procedural risks, benefits and alternatives. All questions were addressed. Maximal Sterile Barrier Technique was utilized including caps, mask, sterile gowns, sterile gloves, sterile drape, hand hygiene and skin antiseptic. A timeout was performed prior to the initiation of the procedure. The right groin was prepped and draped in the usual sterile fashion. Using a micropuncture kit and the modified Seldinger technique, access was gained to the right common femoral artery and an 8 French sheath was placed. Real-time ultrasound guidance was utilized for vascular access including the acquisition of a permanent ultrasound image documenting patency of the accessed vessel. Under fluoroscopy, a 4 [place] Berenstein 2 catheter was navigated over a 0.035" Terumo Glidewire into the aortic arch. The catheter was placed into the left common carotid artery. Frontal and lateral angiograms of the neck were obtained. The catheter was then placed into the left internal carotid artery. Frontal and lateral angiograms of the head were obtained. Next, the catheter was placed into the left external carotid artery. Frontal and lateral angiograms of the neck and head/face were obtained. Left sphenopalatine artery embolization was then carried out as described below. Next, the Envoy catheter was retracted into the aortic arch and then navigated to the right common carotid artery. Frontal and lateral angiograms of the neck were obtained. The catheter was then placed into the right internal carotid artery. Frontal and lateral angiograms of the head were obtained. Attempts to navigate the Envoy catheter into the right external carotid artery proved unsuccessful due to branch takeoff angle. The Envoy catheter was then exchanged over the wire and under biplane roadmap for a 4 [place] Berenstein catheter. Next, the catheter was placed into the right external carotid artery. Frontal and lateral angiograms of the  neck and head/face were obtained. The Berenstein 2 catheter was then exchanged over the wire and under biplane roadmap for a 6 [place] envoy which was placed into into the right external carotid artery. Right sphenopalatine artery embolization was then carried out as described below. The catheter was subsequently withdrawn. A right common femoral artery angiogram was obtained in right anterior oblique view. FINDINGS: 1. Normal caliber of the right common femoral artery, adequate for vascular  access and closure device utilization. 2. Prominent mucosal blush in the nasal cavity on the left with contrast pooling in the inferior and middle meatus. 3. Normal appearing right nasal cavity mucosal blush. 4. Normal appearance of the bilateral carotid bifurcation without hemodynamically significant stenosis. 5. Brisk contrast opacification of the bilateral MCA and ACA vascular tree without significant vascular abnormality. PROCEDURE: After left-sided diagnostic angiogram, the Berenstein 2 catheter was exchanged over the wire and biplane roadmap for a 6 French envoy DA which was placed into the left external carotid artery. Magnified frontal and lateral angiograms of the face were obtained. Using biplane roadmap, a Prowler select Plus microcatheter was navigated over a synchro 2 micro guidewire into the left sphenopalatine artery. Magnified frontal and lateral angiograms were obtained via microcatheter contrast injection. Subsequently, left sphenopalatine artery embolization was carried out with 250-350 microns PVA particles suspension with iodine contrast for opacification. Follow-up angiogram showed near complete embolization. The catheter was slightly pulled back more proximally and additional embolization with PVA particles was performed. The microcatheter was retracted under continuous aspiration. 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 The Envoy catheter was then placed  into the left internal carotid artery. Frontal and lateral angiograms of the head were obtained with no evidence of nontarget embolization. Mild nasal septum blush is noted via ethmoidal branches of the left ophthalmic artery. After right-sided diagnostic angiogram, a Prowler select Plus microcatheter was navigated over a synchro 2 micro guidewire into the right sphenopalatine artery under biplane roadmap guidance. Magnified frontal and lateral angiograms were obtained via microcatheter contrast injection. Subsequently, right sphenopalatine artery embolization was carried out with 250-350 microns PVA particles suspension with iodine contrast for opacification. 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 The Envoy catheter was then placed into the right internal carotid artery. Frontal and lateral angiograms of the head were obtained with no evidence of nontarget embolization. At the end of the procedure, the 8 French right common femoral artery sheath was exchanged over the wire for a Perclose ProGlide which was utilized for access closure. Immediate hemostasis was achieved. IMPRESSION: 1. Prominent mucosal blush in the left nasal cavity with active contrast extravasation. 2. Successful and uncomplicated endovascular embolization of the bilateral sphenopalatine arteries with polyvinyl alcohol particles for treatment of epistaxis refractory to nasal packing. 3. Residual mucosal blush of the nasal septum via anterior ethmoidal branches of the left ophthalmic artery. PLAN: Patient will remain intubated due to aspiration risk and will be transferred to ICU for continued management. Electronically Signed   By: Pedro Earls M.D.   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 Presented to there ED at [facility] with generalized weakness, and difficulty ambulating. Patient developed acute bleeding of the left nare on 11.14.22 at 22:30. Rhinorocket placed for hemostasis was unsuccessful. 8 cm Merocel sponge and Afrin attempted by ENT also unsuccessful. ENT placed a Epistat catheter with 19 ml balloon. She comes to our service for a cerebral angiogram with endovascular epistaxis embolization. EXAM: ULTRASOUND-GUIDED VASCULAR ACCESS DIAGNOSTIC CEREBRAL ANGIOGRAM VASCULAR EMBOLIZATION OF BILATERAL SPHENOPALATINE ARTERIES COMPARISON:  None. MEDICATIONS: Ancef 2 gm IV. The antibiotic was administered within 1 hour of the procedure. ANESTHESIA/SEDATION: The procedure was performed under general anesthesia.  CONTRAST:  170 mL of Omnipaque 300 milligram/mL FLUOROSCOPY TIME:  Fluoroscopy Time: 45 minutes 42 seconds (1979 mGy). COMPLICATIONS: None immediate. TECHNIQUE: Informed written consent was obtained from the patient and her husband after a thorough discussion of the procedural risks, benefits and alternatives. All questions were addressed. Maximal Sterile Barrier Technique was utilized including caps, mask, sterile gowns, sterile gloves, sterile drape, hand hygiene and skin antiseptic. A timeout was performed prior to the initiation of the procedure. The right groin was prepped and draped in the usual sterile fashion. Using a micropuncture kit and the modified Seldinger technique, access was gained to the right common femoral artery and an 8 French sheath was placed. Real-time ultrasound guidance was utilized for vascular access including the acquisition of a permanent ultrasound image documenting patency of the accessed vessel. Under fluoroscopy, a 4 [place] Berenstein 2 catheter was navigated over a 0.035" Terumo Glidewire into the aortic arch. The catheter was placed into the left common carotid artery. Frontal  and lateral angiograms of the neck were obtained. The catheter was then placed into the left internal carotid artery. Frontal and lateral angiograms of the head were obtained. Next, the catheter was placed into the left external carotid artery. Frontal and lateral angiograms of the neck and head/face were obtained. Left sphenopalatine artery embolization was then carried out as described below. Next, the Envoy catheter was retracted into the aortic arch and then navigated to the right common carotid artery. Frontal and lateral angiograms of the neck were obtained. The catheter was then placed into the right internal carotid artery. Frontal and lateral angiograms of the head were obtained. Attempts to navigate the Envoy catheter into the right external carotid artery proved unsuccessful due to branch takeoff angle. The Envoy catheter was then exchanged over the wire and under biplane roadmap for a 4 [place] Berenstein catheter. Next, the catheter was placed into the right external carotid artery. Frontal and lateral angiograms of the neck and head/face were obtained. The Berenstein 2 catheter was then exchanged over the wire and under biplane roadmap for a 6 [place] envoy which was placed into into the right external carotid artery. Right sphenopalatine artery embolization was then carried out as described below. The catheter was subsequently withdrawn. A right common femoral artery angiogram was obtained in right anterior oblique view. FINDINGS: 1. Normal caliber of the right common femoral artery, adequate for vascular access and closure device utilization. 2. Prominent mucosal blush in the nasal cavity on the left with contrast pooling in the inferior and middle meatus. 3. Normal appearing right nasal cavity mucosal blush. 4. Normal appearance of the bilateral carotid bifurcation without hemodynamically significant stenosis. 5. Brisk contrast opacification of the bilateral MCA and ACA vascular tree without  significant vascular abnormality. PROCEDURE: After left-sided diagnostic angiogram, the Berenstein 2 catheter was exchanged over the wire and biplane roadmap for a 6 French envoy DA which was placed into the left external carotid artery. Magnified frontal and lateral angiograms of the face were obtained. Using biplane roadmap, a Prowler select Plus microcatheter was navigated over a synchro 2 micro guidewire into the left sphenopalatine artery. Magnified frontal and lateral angiograms were obtained via microcatheter contrast injection. Subsequently, left sphenopalatine artery embolization was carried out with 250-350 microns PVA particles suspension with iodine contrast for opacification. Follow-up angiogram showed near complete embolization. The catheter was slightly pulled back more proximally and additional embolization with PVA particles was performed. The microcatheter was retracted under continuous aspiration. Follow-up left ECA angiograms showed complete  occlusion of the left sphenopalatine artery. The Envoy catheter was then placed into the left internal carotid artery. Frontal and lateral angiograms of the head were obtained with no evidence of nontarget embolization. Mild nasal septum blush is noted via ethmoidal branches of the left ophthalmic artery. After right-sided diagnostic angiogram, a Prowler select Plus microcatheter was navigated over a synchro 2 micro guidewire into the right sphenopalatine artery under biplane roadmap guidance. Magnified frontal and lateral angiograms were obtained via microcatheter contrast injection. Subsequently, right sphenopalatine artery embolization was carried out with 250-350 microns PVA particles suspension with iodine contrast for opacification. 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 The Envoy catheter was then placed into the right internal carotid artery. Frontal and lateral angiograms of the head were obtained  with no evidence of nontarget embolization. At the end of the procedure, the 8 French right common femoral artery sheath was exchanged over the wire for a Perclose ProGlide which was utilized for access closure. Immediate hemostasis was achieved. IMPRESSION: 1. Prominent mucosal blush in the left nasal cavity with active contrast extravasation. 2. Successful and uncomplicated endovascular embolization of the bilateral sphenopalatine arteries with polyvinyl alcohol particles for treatment of epistaxis refractory to nasal packing. 3. Residual mucosal blush of the nasal septum via anterior ethmoidal branches of the left ophthalmic artery. PLAN: Patient will remain intubated due to aspiration risk and will be transferred to ICU for continued management. Electronically Signed   By: Pedro Earls M.D.   On: 07/18/2021 14:08    Microbiology: Recent Results (from the past 240 hour(s))  Culture, blood (x 2)     Status: None   Collection Time: 07/27/21  9:15 AM   Specimen: BLOOD  Result Value Ref Range Status   Specimen Description BLOOD RIGHT ANTECUBITAL  Final   Special Requests   Final    BOTTLES DRAWN AEROBIC AND ANAEROBIC Blood Culture results may not be optimal due to an excessive volume of blood received in culture bottles   Culture   Final    NO GROWTH 5 DAYS Performed at [facility], [place] [address], [place], [place] [address]    Report Status 08/01/2021 FINAL  Final  Culture, blood (x 2)     Status: None   Collection Time: 07/27/21  9:16 AM   Specimen: BLOOD RIGHT HAND  Result Value Ref Range Status   Specimen Description BLOOD RIGHT HAND  Final   Special Requests   Final    BOTTLES DRAWN AEROBIC AND ANAEROBIC Blood Culture results may not be optimal due to an excessive volume of blood received in culture bottles   Culture   Final    NO GROWTH 5 DAYS Performed at [facility], [place] [address] [place], [place] [address]    Report Status 08/01/2021 FINAL  Final   Urine Culture     Status: Abnormal   Collection Time: 07/27/21  2:46 PM   Specimen: Urine, Suprapubic  Result Value Ref Range Status   Specimen Description URINE, SUPRAPUBIC  Final   Special Requests   Final    NONE Performed at [facility], [place] [address] [place], [place] [address]    Culture (A)  Final    >=100,000 COLONIES/mL ENTEROCOCCUS FAECALIS 70,000 COLONIES/mL KLEBSIELLA PNEUMONIAE Confirmed Extended Spectrum Beta-Lactamase Producer (ESBL).  In bloodstream infections from ESBL organisms, carbapenems are preferred over piperacillin/tazobactam. They are shown to have a lower risk of mortality.    Report  Status 07/29/2021 FINAL  Final   Organism ID, Bacteria ENTEROCOCCUS FAECALIS (A)  Final   Organism ID, Bacteria KLEBSIELLA PNEUMONIAE (A)  Final      Susceptibility   Enterococcus faecalis - MIC*    AMPICILLIN <=2 SENSITIVE Sensitive     NITROFURANTOIN <=16 SENSITIVE Sensitive     VANCOMYCIN 1 SENSITIVE Sensitive     * >=100,000 COLONIES/mL ENTEROCOCCUS FAECALIS   Klebsiella pneumoniae - MIC*    AMPICILLIN >=32 RESISTANT Resistant     CEFAZOLIN >=64 RESISTANT Resistant     CEFEPIME >=32 RESISTANT Resistant     CEFTRIAXONE >=64 RESISTANT Resistant     CIPROFLOXACIN >=4 RESISTANT Resistant     GENTAMICIN <=1 SENSITIVE Sensitive     IMIPENEM <=0.25 SENSITIVE Sensitive     NITROFURANTOIN 128 RESISTANT Resistant     TRIMETH/SULFA >=320 RESISTANT Resistant     AMPICILLIN/SULBACTAM >=32 RESISTANT Resistant     PIP/TAZO 32 INTERMEDIATE Intermediate     * 70,000 COLONIES/mL KLEBSIELLA PNEUMONIAE     Labs: Basic Metabolic Panel: Recent Labs  Lab 07/26/21 0933 07/27/21 0856 07/28/21 0316  NA 138 137 139  K 3.6 3.8 3.6  CL 102 102 105  CO2 '30 27 27  ' GLUCOSE 105* 107* 138*  BUN '12 13 15  ' CREATININE 0.81 0.77 0.65  CALCIUM 8.6* 8.7* 8.7*   Liver Function Tests: Recent Labs  Lab 07/26/21 0933 07/27/21 0856 07/28/21 0316  AST '23 25 22  ' ALT 37 35 35   ALKPHOS 97 104 89  BILITOT 0.3 0.7 0.4  PROT 5.6* 6.0* 5.6*  ALBUMIN 2.6* 2.8* 2.5*   No results for input(s): LIPASE, AMYLASE in the last 168 hours. No results for input(s): AMMONIA in the last 168 hours. CBC: Recent Labs  Lab 07/26/21 0933 07/27/21 0856 07/28/21 0316  WBC 10.9* 12.9* 12.1*  NEUTROABS  --  7.8*  --   HGB 10.3* 10.9* 9.8*  HCT 33.4* 33.7* 31.3*  MCV 99.1 96.6 97.5  PLT 177 191 191   Cardiac Enzymes: No results for input(s): CKTOTAL, CKMB, CKMBINDEX, TROPONINI in the last 168 hours. BNP: BNP (last 3 results) No results for input(s): BNP in the last 8760 hours.  ProBNP (last 3 results) No results for input(s): PROBNP in the last 8760 hours.  CBG: No results for input(s): GLUCAP in the last 168 hours.     Signed:  Oswald Hillock MD.  [facility] 08/01/2021, 4:38 PM

## 2021-08-01 NOTE — Progress Notes (Signed)
Occupational Therapy Treatment Patient Details Name: Katelyn Lamb MRN: [id number] DOB: 1946-01-27 Today's Date: 08/01/2021   History of present illness Pt is a 75 y.o. F who presents 07/14/2021 with working diagnosis of catheter associated UTI - likely EBSL and increasing bilateral lower extremity weakness. Patient with epistaxis. s/p embolization of L IMAX on 11/15. Intubated 11/15-11/16. 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   OT comments  Pt has made significant progress since last session. Completed bed mobility with Mod A then mobilized to [facility] then chair with use of Stedy with Max A +2 at times form lower surfaces; after repetition, able to stand from paddles x 3 with min A. Feel pt would benefit from rehab at SNF to improve mobility and strength in order to facilitate safe DC home and reduce burden of care. Per notes, [employer] has denied SNF for needed rehab therefore recommend DME listed below in addition to max [facility] support available. Husband voiced concerns regarding his ability to care for his wife at home and had questions regarding home health and available resources - CM notified. Will plan to follow up tomorrow with focus of education on bed level ADL with husband.    Recommendations for follow up therapy are one component of a multi-disciplinary discharge planning process, led by the attending physician.  Recommendations may be updated based on patient status, additional functional criteria and insurance authorization.    Follow Up Recommendations  Skilled nursing-short term rehab (<3 hours/day) (HHOT; [place])    Assistance Recommended at Discharge Frequent or [facility] [facility] bed;Other (comment) Harrel Lemon)    Recommendations for Other Services      Precautions / Restrictions  Precautions Precautions: Fall Precaution Comments: suprapubic catheter       Mobility Bed Mobility Overal bed mobility: Needs Assistance Bed Mobility: Supine to Sit     Supine to sit: Mod assist          Transfers Overall transfer level: Needs assistance                 General transfer comment: Max A +2 from lower surface; moD to stand from paddles initially then progressed to min A Transfer via Lift Equipment: Stedy   Balance     Sitting balance-Leahy Scale: Fair Sitting balance - Comments: posterior bias                                   ADL either performed or assessed with clinical judgement   ADL   Eating/Feeding: Minimal assistance Eating/Feeding Details (indicate cue type and reason): using built up hadnles Grooming: Minimal assistance   Upper Body Bathing: Moderate assistance;Bed level   Lower Body Bathing: Maximal assistance;Bed level   Upper Body Dressing : Moderate assistance   Lower Body Dressing: Total assistance   Toilet Transfer: Maximal assistance;+2 for physical assistance (using Stedy)   Toileting- Clothing Manipulation and Hygiene: Total assistance Toileting - Clothing Manipulation Details (indicate cue type and reason): Pt aware of needing to have BM; Transferred to [facility] with i=use of Stedy            Extremity/Trunk Assessment Upper Extremity Assessment Upper Extremity Assessment: Generalized weakness (Ablet ocomplete hand to top of head pattern BUE)   Lower Extremity Assessment Lower Extremity Assessment: Defer to PT evaluation  Vision       Perception     Praxis      Cognition Arousal/Alertness: Awake/alert Behavior During Therapy: WFL for tasks assessed/performed Overall Cognitive Status: Impaired/Different from baseline Area of Impairment: Attention;Memory;Safety/judgement;Awareness;Problem solving                   Current Attention Level: Selective Memory: Decreased short-term  memory Following Commands: Follows one step commands consistently   Awareness: Emergent Problem Solving: Slow processing General Comments: improved cognition since last session          Exercises General Exercises - Upper Extremity Shoulder Flexion: AROM;Both;20 reps;Seated (to 90 FF) Elbow Flexion: AROM;Both;20 reps;Seated Elbow Extension: AROM;Both;20 reps;Seated   Shoulder Instructions       General Comments      Pertinent Vitals/ Pain       Pain Assessment: Faces Faces Pain Scale: Hurts a little bit Pain Location: bottom Pain Descriptors / Indicators: Discomfort;Grimacing Pain Intervention(s): Limited activity within patient's tolerance  Home Living                                          Prior Functioning/Environment              Frequency  Min 2X/week        Progress Toward Goals  OT Goals(current goals can now be found in the care plan section)  Progress towards OT goals: Progressing toward goals  Acute Rehab OT Goals Patient Stated Goal: to get stronger OT Goal Formulation: With patient/family Time For Goal Achievement: 08/03/21 Potential to Achieve Goals: Fair ADL Goals Pt Will Perform Eating: with adaptive utensils;sitting;with supervision;with set-up Pt Will Perform Grooming: with set-up;sitting Pt Will Perform Lower Body Bathing: with min assist;sit to/from stand Pt Will Perform Lower Body Dressing: with min assist;sit to/from stand Pt Will Transfer to Toilet: with min assist;stand pivot transfer;bedside commode Pt Will Perform Tub/Shower Transfer: with min assist;tub bench;rolling walker Additional ADL Goal #1: Bed mobility with mod A  in preparation for ADL Additional ADL Goal #2: Pt will maintain midline postural control sitting EOB x 10 min with [place] Discharge plan remains appropriate    Co-evaluation    PT/OT/SLP Co-Evaluation/Treatment: Yes Reason for Co-Treatment: Complexity of the patient's impairments  (multi-system involvement);For patient/therapist safety;To address functional/ADL transfers   OT goals addressed during session: ADL's and self-care;Strengthening/ROM      AM-PAC OT "6 Clicks" Daily Activity     Outcome Measure   Help from another person eating meals?: A Little Help from another person taking care of personal grooming?: A Little Help from another person toileting, which includes using toliet, bedpan, or urinal?: Total Help from another person bathing (including washing, rinsing, drying)?: A Lot Help from another person to put on and taking off regular upper body clothing?: A Lot Help from another person to put on and taking off regular lower body clothing?: Total 6 Click Score: 12    End of Session Equipment Utilized During Treatment:  (RA)  OT Visit Diagnosis: Unsteadiness on feet (R26.81);Other abnormalities of gait and mobility (R26.89);Muscle weakness (generalized) (M62.81);History of falling (Z91.81);Other symptoms and signs involving cognitive function;Pain Pain - part of body:  (bottom)   Activity Tolerance Patient tolerated treatment well   Patient Left in chair;with call bell/phone within reach;with chair alarm set;with family/visitor present   Nurse Communication Mobility status;Other (comment) (DC needs)  Time: 0881-1031 OT Time Calculation (min): 39 min  Charges: OT General Charges $OT Visit: 1 Visit OT Treatments $Self Care/Home Management : 8-22 mins  Maurie Boettcher, OT/L   Acute OT Clinical Specialist [place] Pager [phone] Office [phone]   [facility] 08/01/2021, 4:31 PM

## 2021-08-01 NOTE — Progress Notes (Signed)
Physical Therapy Treatment Patient Details Name: Katelyn Lamb MRN: [id number] DOB: [birth year] Today's Date: 08/01/2021   History of Present Illness Pt is a 75 y.o. F who presents 07/14/2021 with working diagnosis of catheter associated UTI - likely EBSL and increasing bilateral lower extremity weakness. Patient with epistaxis. s/p embolization of L IMAX on 11/15. Intubated 11/15-11/16. 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    PT Comments    PT and OT saw pt together to maximize mobility progression, in preparation for d/c home. Pt very engaged in session and motivated to progress mobility. Pt overall at mod-max assist level for transfer OOB, pt's husband present during session and understands pt will need significant equipment to assist pt in mobility at home. Hoyer lift and hospital bed discussed with pt and family, CSM notified. PT to continue to follow.     Recommendations for follow up therapy are one component of a multi-disciplinary discharge planning process, led by the attending physician.  Recommendations may be updated based on patient status, additional functional criteria and insurance authorization.  Follow Up Recommendations  Skilled nursing-short term rehab (<3 hours/day) (if SNF denied, recommending maximizing HH services (PT, OT, aide))     Assistance Recommended at Discharge Frequent or [place] [facility] bed;Other (comment) (hoyer lift)    Recommendations for Other Services       Precautions / Restrictions Precautions Precautions: Fall Precaution Comments: suprapubic catheter Restrictions Weight Bearing Restrictions: No     Mobility  Bed Mobility Overal bed mobility: Needs Assistance Bed Mobility: Supine to Sit     Supine to sit: Mod assist;+2 for physical assistance     General  bed mobility comments: assist for trunk and LE management, scooting to/from EOB, and boost up in bed.    Transfers Overall transfer level: Needs assistance Equipment used: Ambulation equipment used Transfers: Sit to/from Stand Sit to Stand: Mod assist;From elevated surface;Max assist           General transfer comment: Max A +2 from lower surface; mod to stand from paddles Transfer via Lift Equipment: Stedy  Ambulation/Gait               General Gait Details: unable   Stairs             Wheelchair Mobility    Modified Rankin (Stroke Patients Only)       Balance Overall balance assessment: Needs assistance Sitting-balance support: Feet supported;Single extremity supported Sitting balance-Leahy Scale: Fair Sitting balance - Comments: posterioro bias Postural control: Posterior lean Standing balance support: Bilateral upper extremity supported Standing balance-Leahy Scale: Poor Standing balance comment: reliant on external assist, tolerance x10 seconds standing                            Cognition Arousal/Alertness: Awake/alert Behavior During Therapy: WFL for tasks assessed/performed Overall Cognitive Status: Impaired/Different from baseline Area of Impairment: Attention;Memory;Safety/judgement;Awareness;Problem solving                   Current Attention Level: Selective Memory: Decreased short-term memory Following Commands: Follows one step commands consistently   Awareness: Emergent Problem Solving: Slow processing General Comments: improved cognition since last session        Exercises General Exercises - Upper Extremity Shoulder Flexion: AROM;Both;20 reps;Seated (to 90 FF) Elbow Flexion: AROM;Both;20 reps;Seated Elbow Extension: AROM;Both;20 reps;Seated General Exercises -  Lower Extremity Long Arc Quad: AROM;Both;Seated;15 reps    General Comments        Pertinent Vitals/Pain Pain Assessment: Faces Faces Pain  Scale: Hurts a little bit Pain Location: bottom Pain Descriptors / Indicators: Sore;Discomfort Pain Intervention(s): Limited activity within patient's tolerance;Monitored during session;Repositioned    Home Living                          Prior Function            PT Goals (current goals can now be found in the care plan section) Acute Rehab PT Goals Patient Stated Goal: pt spouse would like pt to be able to get to a level where he can assist her PT Goal Formulation: With patient/family Time For Goal Achievement: 08/14/21 Potential to Achieve Goals: Fair Progress towards PT goals: Progressing toward goals    Frequency    Min 3X/week (SNF vs home, pending insurance approval)      PT Plan Current plan remains appropriate    Co-evaluation PT/OT/SLP Co-Evaluation/Treatment: Yes Reason for Co-Treatment: Complexity of the patient's impairments (multi-system involvement);For patient/therapist safety;To address functional/ADL transfers PT goals addressed during session: Mobility/safety with mobility;Proper use of DME;Strengthening/ROM OT goals addressed during session: ADL's and self-care;Strengthening/ROM      AM-PAC PT "6 Clicks" Mobility   Outcome Measure  Help needed turning from your back to your side while in a flat bed without using bedrails?: A Lot Help needed moving from lying on your back to sitting on the side of a flat bed without using bedrails?: A Lot Help needed moving to and from a bed to a chair (including a wheelchair)?: A Lot Help needed standing up from a chair using your arms (e.g., wheelchair or bedside chair)?: Total Help needed to walk in hospital room?: Total Help needed climbing 3-5 steps with a railing? : Total 6 Click Score: 9    End of Session Equipment Utilized During Treatment: Gait belt Activity Tolerance: Patient tolerated treatment well Patient left: with call bell/phone within reach;with bed alarm set;in chair;with chair alarm  set Nurse Communication: Mobility status;Need for lift equipment (NT notified) PT Visit Diagnosis: Unsteadiness on feet (R26.81);Muscle weakness (generalized) (M62.81);History of falling (Z91.81)     Time: 0973-5329 PT Time Calculation (min) (ACUTE ONLY): 38 min  Charges:  $Therapeutic Activity: 8-22 mins                     Stacie Glaze, PT DPT Acute Rehabilitation Services Pager [phone]  Office [phone]    Roxine Caddy E Ruffin Pyo 08/01/2021, 5:02 PM

## 2021-08-01 NOTE — TOC Progression Note (Signed)
Transition of Care (TOC) - Progression Note  Marvetta Gibbons RN,BSN Transitions of Care Unit 4NP (Non Trauma)- RN Case Manager See Treatment Team for direct Phone #    Patient Details  Name: REVE CROCKET MRN: [id number] Date of Birth: 1946/01/27  Transition of Care [facility]) CM/SW Contact  Dahlia Client, Romeo Rabon, RN Phone Number: 08/01/2021, 4:20 PM  Clinical Narrative:    CSW informed CM that insurance has issued another denial for ST SNF stay- P2P offered and MD has left msg with insurance- awaiting return call.  Call made to spouse to discuss plan for transition home if denial upheld with P2P. Per spouse he has found a hospital bed that he wants to purchase out of pocket and submit a claim to see if insurance will reimburse. Will see if MD will place DME order for bed to assist with this, pt will also need hoyer lift for home- spouse does not have preference for agency to arrange Lift and agreeable to in house provider. Will also request order for Lift.  Spouse states that they would want to continue [facility] services with [facility]- would need orders for HHPT/OT/aide.  Spouse also requesting info on private duty agencies - verbal permission from spouse for CM to do "google" search and provide list - search done and list provided on private duty as per spouse request.   Patient will need EMS transport home. Spouse is in process of having hospital bed delivered to home.   1615- informed by Dr, Darrick Meigs -P2P completed with [facility]- and Dr. Amalia Hailey has approved pt for a STSNF stay of 7 days. CSW to follow up with spouse regarding approval for 7 days and see if he has decided on a bed offer. TOC to f/u in am regarding spouse decision regarding SNF bed offers- if bed not selected then pt will need to discharge home with [facility].    Expected Discharge Plan: [place] Barriers to Discharge: Continued Medical Work up  Expected Discharge Plan and Services Expected Discharge Plan: [place]   Discharge Planning Services: CM Consult Post Acute Care Choice: [place] Living arrangements for the past 2 months: Single Family Home                   DME Agency: [place] Agency: [facility]         Social Determinants of Health (SDOH) Interventions    Readmission Risk Interventions No flowsheet data found.

## 2021-08-02 DIAGNOSIS — N390 Urinary tract infection, site not specified: Secondary | ICD-10-CM | POA: Diagnosis not present

## 2021-08-02 DIAGNOSIS — T83511A Infection and inflammatory reaction due to indwelling urethral catheter, initial encounter: Secondary | ICD-10-CM | POA: Diagnosis not present

## 2021-08-02 LAB — RESP PANEL BY RT-PCR (FLU A&B, COVID) ARPGX2
Influenza A by PCR: NEGATIVE
Influenza B by PCR: NEGATIVE
SARS Coronavirus 2 by RT PCR: NEGATIVE

## 2021-08-02 NOTE — Discharge Summary (Signed)
Physician Discharge Summary  Katelyn Lamb XIH:[id number] DOB: 11-Dec-1945 DOA: 07/14/2021  PCP: [name], MD  Admit date: 07/14/2021 Discharge date: 08/02/2021  Time spent: 60 minutes  Recommendations for Outpatient Follow-up:  Follow-up with ENT Dr. [name] in 2 weeks Palliative care services as outpatient   Discharge Diagnoses:  Principal Problem:   Urinary tract infection associated with catheterization of urinary tract, initial encounter [facility]) Active Problems:   Incomplete paraplegia ([place])   Chronic pain syndrome   Acute respiratory failure with hypoxia ([place])   Generalized weakness   History of ESBL E. coli infection   History of ESBL Klebsiella pneumoniae infection   Asymptomatic bacteriuria   Epistaxis s/p embolization 07-18-2021   Platelet inhibition due to Plavix  Discharge Condition: Stable  Diet recommendation: Regular diet  Filed Weights   07/28/21 0608 07/30/21 0432 07/31/21 0500  Weight: 77.2 kg 81.7 kg 81 kg    History of present illness:  75 year old female with a history of encephalomyelitis and transverse colitis HSV 5 years ago prior to admission, neurogenic bladder with suprapubic catheter, incomplete paraplegia, recurrent UTIs was admitted on 07/14/2021 due to weakness and unable to ambulate due to UTI.  Was started on meropenem.  ID was consulted on 07/15/2021 and antibiotics were held as it was thought to be due to colonization.  Unfortunately she developed epistaxis from left nare, Rhino Rocket was placed that was unsuccessful.  ENT was consulted to control the bleeding which initially was unsuccessful, patient had to be intubated for interventional procedure with embolization of the left sphenopalatine artery on 07/18/2021.  She was successfully extubated on 07/19/2021.  She was given Kcentra on 07/18/2021  Hospital Course:   Sepsis secondary to UTI, POA, resolved -Patient was tachycardic, febrile with temperature 101, hypotensive, urine culture  grew 100,000 colonies of Enterococcus faecalis and 70,000 colonies of Klebsiella pneumoniae -She was started on vancomycin and meropenem -now completed antibiotic course -Continue prednisone taper back to home prednisone dosing at 5 mg daily   Acute epistaxis of the left nare in the setting of anticoagulation with Xarelto -S/p embolization of the left sphenopalatine artery per IR -Previously on Xarelto ,she is not a candidate for anticoagulation -We will follow-up with the ENT where they can have further discussion on possibility of restarting anticoagulation -Status post 1 unit PRBC   Acute respiratory failure with hypoxemia -Intubated for airway protection -She required face tent with 5 L/min -Oxygen has been weaned off without hypoxia   History of transverse myelitis -Patient has incomplete paraplegia -She had declining physical strength recently -PT recommended rehab at skilled nursing facility -Insurance is approved going to skilled nursing facility for rehab for 7 days -Husband is going to discuss with family to go to skilled nursing facility versus going home  Urinary retention -Patient has suprapubic catheter in place which has been leaking -Called and discussed with urologist Dr. Abner Greenspan, he recommends catheter to be changed per RN. -Suprapubic catheter was replaced yesterday   History of DVT -Xarelto on hold due to above -She has had 2 episodes of DVT in the past  -She will follow up with the ENT to discuss when to restart taking Xarelto   Delirium -Resolved    Goals of care -She was seen by palliative care, will get palliative care services as outpatient.   Consultations: ENT  Discharge Exam: Vitals:   08/01/21 2313 08/02/21 0301  BP: 115/81 117/71  Pulse: 93 91  Resp: 18 15  Temp: 98.1 F (36.7 C) (!) 97.5  F (36.4 C)  SpO2: 92% 92%    General: Appears in no acute distress Cardiovascular: S1-S2, regular, no murmur auscultated Respiratory: Clear to  auscultation bilaterally  Discharge Instructions   Discharge Instructions     Diet - low sodium heart healthy   Complete by: As directed    Increase activity slowly   Complete by: As directed       Allergies as of 08/02/2021       Reactions   Demerol [meperidine] Other (See Comments)   Hallucinations   Percocet [oxycodone-acetaminophen] Itching   Amoxicillin-pot Clavulanate Diarrhea   Severe pain, headache, intestinal infection   Penicillins Itching, Rash   Tolerated amoxicillin November 2022  Has patient had a PCN reaction causing immediate rash, facial/tongue/throat swelling, SOB or lightheadedness with hypotension:  NO Has patient had a PCN reaction causing severe rash involving mucus membranes or skin necrosis: No Has patient had a PCN reaction that required hospitalization: No Has patient had a PCN reaction occurring within the last 10 years: Yes If all of the above answers are "NO", then may proceed with Cephalosporin use.        Medication List     STOP taking these medications    naproxen sodium 220 MG tablet Commonly known as: ALEVE   rivaroxaban 20 MG Tabs tablet Commonly known as: XARELTO       TAKE these medications    acyclovir 400 MG tablet Commonly known as: ZOVIRAX TAKE 1 TABLET BY MOUTH TWICE A DAY   CALCIUM 600 + D PO Take 1 tablet by mouth in the morning and at bedtime.   CeraVe Crea Apply 1 application topically daily as needed (bruise spots).   diazepam 5 MG tablet Commonly known as: VALIUM Take 1 tablet (5 mg total) by mouth in the morning and at bedtime. For tremors   diclofenac Sodium 1 % Gel Commonly known as: VOLTAREN Apply 1 application topically 2 (two) times daily as needed (nerve pain).   docusate sodium 100 MG capsule Commonly known as: COLACE Take 200 mg by mouth daily as needed (constipation).   DULoxetine 30 MG capsule Commonly known as: CYMBALTA Take 30 mg by mouth in the morning.   DULoxetine 60 MG  capsule Commonly known as: CYMBALTA Take 1 capsule (60 mg total) by mouth at bedtime.   ferrous sulfate 325 (65 FE) MG tablet Take 325 mg by mouth every morning.   Hair/Skin/Nails/Biotin Tabs Take 1 tablet by mouth every morning.   HYDROcodone-acetaminophen 10-325 MG tablet Commonly known as: NORCO Take 1 tablet by mouth every 6 (six) hours as needed. What changed: reasons to take this   levETIRAcetam 250 MG tablet Commonly known as: KEPPRA TAKE 1 TABLET BY MOUTH TWICE A DAY   mirabegron ER 50 MG Tb24 tablet Commonly known as: MYRBETRIQ Take 50 mg by mouth at bedtime.   omega-3 acid ethyl esters 1 g capsule Commonly known as: LOVAZA Take 1 g by mouth every morning.   pantoprazole 40 MG tablet Commonly known as: PROTONIX Take 1 tablet (40 mg total) by mouth daily. What changed: when to take this   polyethylene glycol 17 g packet Commonly known as: MIRALAX / GLYCOLAX Take 17 g by mouth daily. What changed:  when to take this reasons to take this   predniSONE 5 MG tablet Commonly known as: DELTASONE TAKE 1 TABLET BY MOUTH EVERY DAY WITH BREAKFAST What changed: See the new instructions.   predniSONE 10 MG tablet Commonly known as: DELTASONE Prednisone 40  mg po daily x 1 day then Prednisone 30 mg po daily x 1 day then Prednisone 20 mg po daily x 1 day then Prednisone 10 mg daily x 1 day then Prednisone 5 mg po daily as your usual dose What changed: You were already taking a medication with the same name, and this prescription was added. Make sure you understand how and when to take each.   pregabalin 50 MG capsule Commonly known as: LYRICA Take 2 capsules (100 mg total) by mouth at bedtime.   QUEtiapine 100 MG tablet Commonly known as: SEROQUEL Take 100 mg by mouth at bedtime.   sodium chloride 0.65 % Soln nasal spray Commonly known as: OCEAN Place 1 spray into both nostrils as needed for congestion.   VITAMIN B-12 PO Take 1 tablet by mouth daily.   Vitamin  C 500 MG Chew Chew 500 mg by mouth daily.       Allergies  Allergen Reactions   Demerol [Meperidine] Other (See Comments)    Hallucinations   Percocet [Oxycodone-Acetaminophen] Itching   Amoxicillin-Pot Clavulanate Diarrhea    Severe pain, headache, intestinal infection   Penicillins Itching and Rash    Tolerated amoxicillin November 2022  Has patient had a PCN reaction causing immediate rash, facial/tongue/throat swelling, SOB or lightheadedness with hypotension:  NO Has patient had a PCN reaction causing severe rash involving mucus membranes or skin necrosis: No Has patient had a PCN reaction that required hospitalization: No Has patient had a PCN reaction occurring within the last 10 years: Yes If all of the above answers are "NO", then may proceed with Cephalosporin use.    Follow-up Information     Melissa Montane, MD Follow up in 2 week(s).   Specialty: Otolaryngology Contact information: [address] [place] [address] [phone]                  The results of significant diagnostics from this hospitalization (including imaging, microbiology, ancillary and laboratory) are listed below for reference.    Significant Diagnostic Studies: DG Chest 2 View  Result Date: 07/14/2021 CLINICAL DATA:  Generalized weakness EXAM: CHEST - 2 VIEW COMPARISON:  03/15/2021 FINDINGS: Lungs are well expanded, symmetric, and clear. No pneumothorax or pleural effusion. Cardiac size within normal limits. Pulmonary vascularity is normal. Osseous structures are age-appropriate. No acute bone abnormality. IMPRESSION: No active cardiopulmonary disease. Electronically Signed   By: Fidela Salisbury M.D.   On: 07/14/2021 20:25   DG Thoracic Spine 2 View  Result Date: 07/14/2021 CLINICAL DATA:  Generalized weakness, back pain, recent fall EXAM: THORACIC SPINE 2 VIEWS COMPARISON:  Katelyn Lamb. FINDINGS: Normal thoracic kyphosis. No acute fracture or listhesis of the thoracic spine.  Vertebral body height is preserved. Paraspinal soft tissues are unremarkable. L2 kyphoplasty incidentally noted. Degenerative changes are noted at C3-C6,. IMPRESSION: No acute fracture or listhesis. Electronically Signed   By: Fidela Salisbury M.D.   On: 07/14/2021 20:32   DG Lumbar Spine Complete  Result Date: 07/14/2021 CLINICAL DATA:  Weakness, recent fall, back pain EXAM: LUMBAR SPINE - COMPLETE 4+ VIEW COMPARISON:  12/19/2017 FINDINGS: Normal lumbar lordosis. 7 mm anterolisthesis of L4 upon L5 is unchanged. Interval kyphoplasty of L2 and L5. There are superior endplate fractures of L3 and L4, new since prior examination, with roughly 20-30% loss of height of the L4 vertebral body. These appear chronic in nature. No acute fracture of the lumbar spine. Stable intervertebral disc space narrowing and endplate remodeling at T34-K8 in keeping  with changes of a mild to moderate degenerative disc disease. Vascular calcifications are seen within the abdominal aorta. IMPRESSION: No acute fracture or listhesis of the a lumbar spine. Interval vertebral augmentation of L2 and L5. Interval development of remote appearing fractures of L3 and L4 with approximately 20-30% loss of height of L4. Electronically Signed   By: Fidela Salisbury M.D.   On: 07/14/2021 20:30   DG Sacrum/Coccyx  Result Date: 07/14/2021 CLINICAL DATA:  Generalized weakness, pelvic pain, recent fall EXAM: SACRUM AND COCCYX - 2+ VIEW COMPARISON:  Katelyn Lamb. FINDINGS: The osseous structures are diffusely osteopenic. No acute fracture or dislocation. L5 kyphoplasty has been performed. L3 and L4 vertebral bodies demonstrate loss of height, not well profiled on this examination. The soft tissues are unremarkable. IMPRESSION: No acute fracture or dislocation. Electronically Signed   By: Fidela Salisbury M.D.   On: 07/14/2021 20:27   CT HEAD WO CONTRAST (5MM)  Result Date: 07/14/2021 CLINICAL DATA:  Weakness and difficulty walking. Worse today. Chronic pain  all over. Fall 2 days ago. EXAM: CT HEAD WITHOUT CONTRAST CT CERVICAL SPINE WITHOUT CONTRAST TECHNIQUE: Multidetector CT imaging of the head and cervical spine was performed following the standard protocol without intravenous contrast. Multiplanar CT image reconstructions of the cervical spine were also generated. COMPARISON:  Katelyn Lamb. FINDINGS: CT HEAD FINDINGS BRAIN: BRAIN Cerebral ventricle sizes are concordant with the degree of cerebral volume loss. Patchy and confluent areas of decreased attenuation are noted throughout the deep and periventricular white matter of the cerebral hemispheres bilaterally, compatible with chronic microvascular ischemic disease. No evidence of large-territorial acute infarction. No parenchymal hemorrhage. No mass lesion. No extra-axial collection. No mass effect or midline shift. No hydrocephalus. Basilar cisterns are patent. Vascular: No hyperdense vessel. Skull: No acute fracture or focal lesion. Sinuses/Orbits: Partial effusion of the right mastoid air cells. No fluid within the right middle ear. Paranasal sinuses and left mastoid air cells are clear. Bilateral lens replacement. Otherwise orbits are unremarkable. Other: Katelyn Lamb. CT CERVICAL SPINE FINDINGS Alignment: Normal. Skull base and vertebrae: Multilevel degenerative changes of the spine. No associated severe osseous neural foraminal or central canal stenosis. No acute fracture. No aggressive appearing focal osseous lesion or focal pathologic process. Soft tissues and spinal canal: No prevertebral fluid or swelling. No visible canal hematoma. Upper chest: Unremarkable. Other: Katelyn Lamb. IMPRESSION: 1. No acute intracranial abnormality. 2. No acute displaced fracture or traumatic listhesis of the cervical spine. 3. Partial fusion of the right mastoid air cells. Electronically Signed   By: Iven Finn M.D.   On: 07/14/2021 21:52   CT Cervical Spine Wo Contrast  Result Date: 07/14/2021 CLINICAL DATA:  Weakness and difficulty  walking. Worse today. Chronic pain all over. Fall 2 days ago. EXAM: CT HEAD WITHOUT CONTRAST CT CERVICAL SPINE WITHOUT CONTRAST TECHNIQUE: Multidetector CT imaging of the head and cervical spine was performed following the standard protocol without intravenous contrast. Multiplanar CT image reconstructions of the cervical spine were also generated. COMPARISON:  Katelyn Lamb. FINDINGS: CT HEAD FINDINGS BRAIN: BRAIN Cerebral ventricle sizes are concordant with the degree of cerebral volume loss. Patchy and confluent areas of decreased attenuation are noted throughout the deep and periventricular white matter of the cerebral hemispheres bilaterally, compatible with chronic microvascular ischemic disease. No evidence of large-territorial acute infarction. No parenchymal hemorrhage. No mass lesion. No extra-axial collection. No mass effect or midline shift. No hydrocephalus. Basilar cisterns are patent. Vascular: No hyperdense vessel. Skull: No acute fracture or focal lesion. Sinuses/Orbits: Partial effusion of the  right mastoid air cells. No fluid within the right middle ear. Paranasal sinuses and left mastoid air cells are clear. Bilateral lens replacement. Otherwise orbits are unremarkable. Other: Katelyn Lamb. CT CERVICAL SPINE FINDINGS Alignment: Normal. Skull base and vertebrae: Multilevel degenerative changes of the spine. No associated severe osseous neural foraminal or central canal stenosis. No acute fracture. No aggressive appearing focal osseous lesion or focal pathologic process. Soft tissues and spinal canal: No prevertebral fluid or swelling. No visible canal hematoma. Upper chest: Unremarkable. Other: Katelyn Lamb. IMPRESSION: 1. No acute intracranial abnormality. 2. No acute displaced fracture or traumatic listhesis of the cervical spine. 3. Partial fusion of the right mastoid air cells. Electronically Signed   By: Iven Finn M.D.   On: 07/14/2021 21:52   MR THORACIC SPINE W WO CONTRAST  Result Date:  07/15/2021 CLINICAL DATA:  Follow-up spinal cord injury EXAM: MRI THORACIC AND LUMBAR SPINE WITHOUT AND WITH CONTRAST TECHNIQUE: Multiplanar and multiecho pulse sequences of the thoracic and lumbar spine were obtained without and with intravenous contrast. CONTRAST:  40m GADAVIST GADOBUTROL 1 MMOL/ML IV SOLN COMPARISON:  Thoracic MRI 03/15/2021 FINDINGS: MRI THORACIC SPINE FINDINGS Alignment:  Normal Vertebrae: Horizontal fracture through the T12 vertebral body without marrow edema, interval. No acute fracture. Hemangiomas seen at T2 and L1. Cord: The central canal is faintly visible intermittently in the upper thoracic cord, within normal limits. No cord edema or swelling noted. Paraspinal and other soft tissues: Negative Disc levels: Central disc protrusion on a chronic basis at T5-6. Disc narrowing and bulging at T11-12 and T12-L1. Negative facets. MRI LUMBAR SPINE FINDINGS Segmentation:  5 lumbar type vertebrae Alignment:  Grade 1 anterolisthesis at L4-5 Vertebrae: Remote compression fractures of L2-L5 with cement augmentation at L2 and L5. No acute fracture, discitis, or aggressive bone lesion Conus medullaris: Extends to the L2 level and appears normal. Paraspinal and other soft tissues: Fatty atrophy of intrinsic back muscles Disc levels: T12- L1: Disc narrowing with shallow central protrusion. L1-L2: Disc narrowing and left paracentral protrusion at an annular fissure. L2-L3: Disc narrowing and mild bilateral foraminal bulging L3-L4: Disc narrowing and bulging with mild facet spurring. Mild left foraminal narrowing L4-L5: Facet osteoarthritis with spurring and anterolisthesis. The disc is mildly narrowed with circumferential bulging. Moderate bilateral foraminal narrowing L5-S1:Disc narrowing and left eccentric bulging at with an annular fissure. Facet spurring. IMPRESSION: Thoracic MRI: 1. Interval but healed T12 compression fracture since 03/15/2021, with mild height loss. 2. Unremarkable appearance of  the cord today. Lumbar MRI: 1. No acute finding or neural compression. 2. Multiple remote compression fractures which have healed. 3. Degeneration most notable at L4-5 where there is anterolisthesis and moderate bilateral foraminal narrowing. Electronically Signed   By: JJorje GuildM.D.   On: 07/15/2021 05:00   MR Lumbar Spine W Wo Contrast  Result Date: 07/15/2021 CLINICAL DATA:  Follow-up spinal cord injury EXAM: MRI THORACIC AND LUMBAR SPINE WITHOUT AND WITH CONTRAST TECHNIQUE: Multiplanar and multiecho pulse sequences of the thoracic and lumbar spine were obtained without and with intravenous contrast. CONTRAST:  642mGADAVIST GADOBUTROL 1 MMOL/ML IV SOLN COMPARISON:  Thoracic MRI 03/15/2021 FINDINGS: MRI THORACIC SPINE FINDINGS Alignment:  Normal Vertebrae: Horizontal fracture through the T12 vertebral body without marrow edema, interval. No acute fracture. Hemangiomas seen at T2 and L1. Cord: The central canal is faintly visible intermittently in the upper thoracic cord, within normal limits. No cord edema or swelling noted. Paraspinal and other soft tissues: Negative Disc levels: Central disc protrusion on a chronic basis  at T5-6. Disc narrowing and bulging at T11-12 and T12-L1. Negative facets. MRI LUMBAR SPINE FINDINGS Segmentation:  5 lumbar type vertebrae Alignment:  Grade 1 anterolisthesis at L4-5 Vertebrae: Remote compression fractures of L2-L5 with cement augmentation at L2 and L5. No acute fracture, discitis, or aggressive bone lesion Conus medullaris: Extends to the L2 level and appears normal. Paraspinal and other soft tissues: Fatty atrophy of intrinsic back muscles Disc levels: T12- L1: Disc narrowing with shallow central protrusion. L1-L2: Disc narrowing and left paracentral protrusion at an annular fissure. L2-L3: Disc narrowing and mild bilateral foraminal bulging L3-L4: Disc narrowing and bulging with mild facet spurring. Mild left foraminal narrowing L4-L5: Facet osteoarthritis with  spurring and anterolisthesis. The disc is mildly narrowed with circumferential bulging. Moderate bilateral foraminal narrowing L5-S1:Disc narrowing and left eccentric bulging at with an annular fissure. Facet spurring. IMPRESSION: Thoracic MRI: 1. Interval but healed T12 compression fracture since 03/15/2021, with mild height loss. 2. Unremarkable appearance of the cord today. Lumbar MRI: 1. No acute finding or neural compression. 2. Multiple remote compression fractures which have healed. 3. Degeneration most notable at L4-5 where there is anterolisthesis and moderate bilateral foraminal narrowing. Electronically Signed   By: Jorje Guild M.D.   On: 07/15/2021 05:00   IR Transcath/Emboliz  Result Date: 07/18/2021 INDICATION: 75 year old female with past medical history significant for encephalomyelitis and transverse myelitis due to HSV 5 years ago, neurogenic bladder s/p suprapubic catheter, frequent UTI's, DVT's on Xarelto. Presented to there ED at [facility] with generalized weakness, and difficulty ambulating. Patient developed acute bleeding of the left nare on 11.14.22 at 22:30. Rhinorocket placed for hemostasis was unsuccessful. 8 cm Merocel sponge and Afrin attempted by ENT also unsuccessful. ENT placed a Epistat catheter with 19 ml balloon. She comes to our service for a cerebral angiogram with endovascular epistaxis embolization. EXAM: ULTRASOUND-GUIDED VASCULAR ACCESS DIAGNOSTIC CEREBRAL ANGIOGRAM VASCULAR EMBOLIZATION OF BILATERAL SPHENOPALATINE ARTERIES COMPARISON:  Katelyn Lamb. MEDICATIONS: Ancef 2 gm IV. The antibiotic was administered within 1 hour of the procedure. ANESTHESIA/SEDATION: The procedure was performed under general anesthesia. CONTRAST:  170 mL of Omnipaque 300 milligram/mL FLUOROSCOPY TIME:  Fluoroscopy Time: 45 minutes 42 seconds (1979 mGy). COMPLICATIONS: Katelyn Lamb immediate. TECHNIQUE: Informed written consent was obtained from the patient and her husband after a thorough discussion of the  procedural risks, benefits and alternatives. All questions were addressed. Maximal Sterile Barrier Technique was utilized including caps, mask, sterile gowns, sterile gloves, sterile drape, hand hygiene and skin antiseptic. A timeout was performed prior to the initiation of the procedure. The right groin was prepped and draped in the usual sterile fashion. Using a micropuncture kit and the modified Seldinger technique, access was gained to the right common femoral artery and an 8 French sheath was placed. Real-time ultrasound guidance was utilized for vascular access including the acquisition of a permanent ultrasound image documenting patency of the accessed vessel. Under fluoroscopy, a 4 [place] Berenstein 2 catheter was navigated over a 0.035" Terumo Glidewire into the aortic arch. The catheter was placed into the left common carotid artery. Frontal and lateral angiograms of the neck were obtained. The catheter was then placed into the left internal carotid artery. Frontal and lateral angiograms of the head were obtained. Next, the catheter was placed into the left external carotid artery. Frontal and lateral angiograms of the neck and head/face were obtained. Left sphenopalatine artery embolization was then carried out as described below. Next, the Envoy catheter was retracted into the aortic arch and then navigated to the  right common carotid artery. Frontal and lateral angiograms of the neck were obtained. The catheter was then placed into the right internal carotid artery. Frontal and lateral angiograms of the head were obtained. Attempts to navigate the Envoy catheter into the right external carotid artery proved unsuccessful due to branch takeoff angle. The Envoy catheter was then exchanged over the wire and under biplane roadmap for a 4 [place] Berenstein catheter. Next, the catheter was placed into the right external carotid artery. Frontal and lateral angiograms of the neck and head/face were obtained. The  Berenstein 2 catheter was then exchanged over the wire and under biplane roadmap for a 6 [place] envoy which was placed into into the right external carotid artery. Right sphenopalatine artery embolization was then carried out as described below. The catheter was subsequently withdrawn. A right common femoral artery angiogram was obtained in right anterior oblique view. FINDINGS: 1. Normal caliber of the right common femoral artery, adequate for vascular access and closure device utilization. 2. Prominent mucosal blush in the nasal cavity on the left with contrast pooling in the inferior and middle meatus. 3. Normal appearing right nasal cavity mucosal blush. 4. Normal appearance of the bilateral carotid bifurcation without hemodynamically significant stenosis. 5. Brisk contrast opacification of the bilateral MCA and ACA vascular tree without significant vascular abnormality. PROCEDURE: After left-sided diagnostic angiogram, the Berenstein 2 catheter was exchanged over the wire and biplane roadmap for a 6 French envoy DA which was placed into the left external carotid artery. Magnified frontal and lateral angiograms of the face were obtained. Using biplane roadmap, a Prowler select Plus microcatheter was navigated over a synchro 2 micro guidewire into the left sphenopalatine artery. Magnified frontal and lateral angiograms were obtained via microcatheter contrast injection. Subsequently, left sphenopalatine artery embolization was carried out with 250-350 microns PVA particles suspension with iodine contrast for opacification. Follow-up angiogram showed near complete embolization. The catheter was slightly pulled back more proximally and additional embolization with PVA particles was performed. The microcatheter was retracted under continuous aspiration. 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 The Envoy catheter was then placed into the left internal carotid artery.  Frontal and lateral angiograms of the head were obtained with no evidence of nontarget embolization. Mild nasal septum blush is noted via ethmoidal branches of the left ophthalmic artery. After right-sided diagnostic angiogram, a Prowler select Plus microcatheter was navigated over a synchro 2 micro guidewire into the right sphenopalatine artery under biplane roadmap guidance. Magnified frontal and lateral angiograms were obtained via microcatheter contrast injection. Subsequently, right sphenopalatine artery embolization was carried out with 250-350 microns PVA particles suspension with iodine contrast for opacification. 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 The Envoy catheter was then placed into the right internal carotid artery. Frontal and lateral angiograms of the head were obtained with no evidence of nontarget embolization. At the end of the procedure, the 8 French right common femoral artery sheath was exchanged over the wire for a Perclose ProGlide which was utilized for access closure. Immediate hemostasis was achieved. IMPRESSION: 1. Prominent mucosal blush in the left nasal cavity with active contrast extravasation. 2. Successful and uncomplicated endovascular embolization of the bilateral sphenopalatine arteries with polyvinyl alcohol particles for treatment of epistaxis refractory to nasal packing. 3. Residual mucosal blush of the nasal septum via anterior ethmoidal branches of the left ophthalmic artery. PLAN: Patient will remain intubated due to aspiration risk and will be transferred to ICU for  continued management. Electronically Signed   By: Pedro Earls M.D.   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 Presented to there ED at [facility] with generalized weakness, and difficulty ambulating. Patient developed acute bleeding of the left nare on 11.14.22 at 22:30. Rhinorocket placed for hemostasis was unsuccessful. 8 cm Merocel sponge and Afrin attempted by ENT also unsuccessful. ENT placed a Epistat catheter with 19 ml balloon. She comes to our service for a cerebral angiogram with endovascular epistaxis embolization. EXAM: ULTRASOUND-GUIDED VASCULAR ACCESS DIAGNOSTIC CEREBRAL ANGIOGRAM VASCULAR EMBOLIZATION OF BILATERAL SPHENOPALATINE ARTERIES COMPARISON:  Katelyn Lamb. MEDICATIONS: Ancef 2 gm IV. The antibiotic was administered within 1 hour of the procedure. ANESTHESIA/SEDATION: The procedure was performed under general anesthesia. CONTRAST:  170 mL of Omnipaque 300 milligram/mL FLUOROSCOPY TIME:  Fluoroscopy Time: 45 minutes 42 seconds (1979 mGy). COMPLICATIONS: Katelyn Lamb immediate. TECHNIQUE: Informed written consent was obtained from the patient and her husband after a thorough discussion of the procedural risks, benefits and alternatives. All questions were addressed. Maximal Sterile Barrier Technique was utilized including caps, mask, sterile gowns, sterile gloves, sterile drape, hand hygiene and skin antiseptic. A timeout was performed prior to the initiation of the procedure. The right groin was prepped and draped in the usual sterile fashion. Using a micropuncture kit and the modified Seldinger technique, access was gained to the right common femoral artery and an 8 French sheath was placed. Real-time ultrasound guidance was utilized for vascular access including the acquisition of a permanent ultrasound image documenting patency of the accessed vessel. Under fluoroscopy, a 4 [place] Berenstein 2 catheter was navigated over a 0.035" Terumo Glidewire into the aortic arch. The catheter was placed into the left common carotid artery. Frontal and lateral angiograms of the neck were obtained. The  catheter was then placed into the left internal carotid artery. Frontal and lateral angiograms of the head were obtained. Next, the catheter was placed into the left external carotid artery. Frontal and lateral angiograms of the neck and head/face were obtained. Left sphenopalatine artery embolization was then carried out as described below. Next, the Envoy catheter was retracted into the aortic arch and then navigated to the right common carotid artery. Frontal and lateral angiograms of the neck were obtained. The catheter was then placed into the right internal carotid artery. Frontal and lateral angiograms of the head were obtained. Attempts to navigate the Envoy catheter into the right external carotid artery proved unsuccessful due to branch takeoff angle. The Envoy catheter was then exchanged over the wire and under biplane roadmap for a 4 [place] Berenstein catheter. Next, the catheter was placed into the right external carotid artery. Frontal and lateral angiograms of the neck and head/face were obtained. The Berenstein 2 catheter was then exchanged over the wire and under biplane roadmap for a 6 [place] envoy which was placed into into the right external carotid artery. Right sphenopalatine artery embolization was then carried out as described below. The catheter was subsequently withdrawn. A right common femoral artery angiogram was obtained in right anterior oblique view. FINDINGS: 1. Normal caliber of the right common femoral artery, adequate for vascular access and closure device utilization. 2. Prominent mucosal blush in the nasal cavity on the left with contrast pooling in the inferior and middle meatus. 3. Normal appearing  right nasal cavity mucosal blush. 4. Normal appearance of the bilateral carotid bifurcation without hemodynamically significant stenosis. 5. Brisk contrast opacification of the bilateral MCA and ACA vascular tree without significant vascular abnormality. PROCEDURE: After left-sided  diagnostic angiogram, the Berenstein 2 catheter was exchanged over the wire and biplane roadmap for a 6 French envoy DA which was placed into the left external carotid artery. Magnified frontal and lateral angiograms of the face were obtained. Using biplane roadmap, a Prowler select Plus microcatheter was navigated over a synchro 2 micro guidewire into the left sphenopalatine artery. Magnified frontal and lateral angiograms were obtained via microcatheter contrast injection. Subsequently, left sphenopalatine artery embolization was carried out with 250-350 microns PVA particles suspension with iodine contrast for opacification. Follow-up angiogram showed near complete embolization. The catheter was slightly pulled back more proximally and additional embolization with PVA particles was performed. The microcatheter was retracted under continuous aspiration. 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 The Envoy catheter was then placed into the left internal carotid artery. Frontal and lateral angiograms of the head were obtained with no evidence of nontarget embolization. Mild nasal septum blush is noted via ethmoidal branches of the left ophthalmic artery. After right-sided diagnostic angiogram, a Prowler select Plus microcatheter was navigated over a synchro 2 micro guidewire into the right sphenopalatine artery under biplane roadmap guidance. Magnified frontal and lateral angiograms were obtained via microcatheter contrast injection. Subsequently, right sphenopalatine artery embolization was carried out with 250-350 microns PVA particles suspension with iodine contrast for opacification. 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 The Envoy catheter was then placed into the right internal carotid artery. Frontal and lateral angiograms of the head were obtained with no evidence of nontarget embolization. At the end of the  procedure, the 8 French right common femoral artery sheath was exchanged over the wire for a Perclose ProGlide which was utilized for access closure. Immediate hemostasis was achieved. IMPRESSION: 1. Prominent mucosal blush in the left nasal cavity with active contrast extravasation. 2. Successful and uncomplicated endovascular embolization of the bilateral sphenopalatine arteries with polyvinyl alcohol particles for treatment of epistaxis refractory to nasal packing. 3. Residual mucosal blush of the nasal septum via anterior ethmoidal branches of the left ophthalmic artery. PLAN: Patient will remain intubated due to aspiration risk and will be transferred to ICU for continued management. Electronically Signed   By: Pedro Earls M.D.   On: 07/18/2021 14:08   DG Chest Port 1 View  Result Date: 07/27/2021 CLINICAL DATA:  Shortness of breath. EXAM: PORTABLE CHEST 1 VIEW COMPARISON:  Chest x-ray dated July 22, 2021. FINDINGS: The patient is rotated to the left. The heart size and mediastinal contours are within normal limits. Normal pulmonary vascularity. No focal consolidation, pleural effusion, or pneumothorax. No acute osseous abnormality. IMPRESSION: No active disease. Electronically Signed   By: Titus Dubin M.D.   On: 07/27/2021 11:21   DG CHEST PORT 1 VIEW  Result Date: 07/22/2021 CLINICAL DATA:  Fever. EXAM: PORTABLE CHEST 1 VIEW COMPARISON:  July 19, 2021 FINDINGS: The ET and NG tubes have been removed. The heart, hila, mediastinum, lungs, and pleura are unremarkable. IMPRESSION: No active disease. Electronically Signed   By: Dorise Bullion III M.D.   On: 07/22/2021 19:06   DG Chest Port 1 View  Result Date: 07/19/2021 CLINICAL DATA:  Intubated, ventilated EXAM: PORTABLE CHEST 1 VIEW COMPARISON:  Chest radiograph dated 1 day prior FINDINGS: The endotracheal  tube has been retracted and now projects approximately 4.0 cm from the carina. There is a new enteric catheter  in place with the tip coursing off the field of view. The cardiomediastinal silhouette is normal. The lungs are clear, without focal consolidation or pulmonary edema. There is no pleural effusion or pneumothorax. There is no acute osseous abnormality. IMPRESSION: 1. Interval retraction of the endotracheal tube tip which is now in satisfactory position. 2. Clear lungs. Electronically Signed   By: Valetta Mole M.D.   On: 07/19/2021 08:08   DG Chest Port 1 View  Result Date: 07/18/2021 CLINICAL DATA:  Endotracheal tube placement. EXAM: PORTABLE CHEST 1 VIEW COMPARISON:  07/14/2021 FINDINGS: No 633 hours. Low lung volumes. Cardiopericardial silhouette is at upper limits of normal for size. The lungs are clear without focal pneumonia, edema, pneumothorax or pleural effusion. Endotracheal tube tip is just into the right mainstem bronchus and could be retracted 3 cm for more appropriate positioning. Bones are diffusely demineralized. Telemetry leads overlie the chest. IMPRESSION: 1. Endotracheal tube tip just into the right mainstem bronchus. Recommend retracting 3 cm for more appropriate positioning. 2. Low lung volumes without acute cardiopulmonary findings. I called these results to the patient's nurse, Loma Sousa, at approximately 0656 hours on 07/18/2021. Electronically Signed   By: Misty Stanley M.D.   On: 07/18/2021 06:57   DG Abd Portable 1V  Result Date: 07/18/2021 CLINICAL DATA:  OG tube placement EXAM: PORTABLE ABDOMEN - 1 VIEW COMPARISON:  Katelyn Lamb. FINDINGS: Enteric tube tip is in the pyloric region. Included bowel gas pattern is unremarkable. IMPRESSION: Enteric tube in pyloric region. Electronically Signed   By: Macy Mis M.D.   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 Presented to there ED at [facility] with generalized weakness, and difficulty ambulating. Patient developed acute bleeding of the left nare on 11.14.22 at 22:30. Rhinorocket placed for hemostasis was unsuccessful. 8 cm Merocel sponge and Afrin attempted by ENT also unsuccessful. ENT placed a Epistat catheter with 19 ml balloon. She comes to our service for a cerebral angiogram with endovascular epistaxis embolization. EXAM: ULTRASOUND-GUIDED VASCULAR ACCESS DIAGNOSTIC CEREBRAL ANGIOGRAM VASCULAR EMBOLIZATION OF BILATERAL SPHENOPALATINE ARTERIES COMPARISON:  Katelyn Lamb. MEDICATIONS: Ancef 2 gm IV. The antibiotic was administered within 1 hour of the procedure. ANESTHESIA/SEDATION: The procedure was performed under general anesthesia. CONTRAST:  170 mL of Omnipaque 300 milligram/mL FLUOROSCOPY TIME:  Fluoroscopy Time: 45 minutes 42 seconds (1979 mGy). COMPLICATIONS: Katelyn Lamb immediate. TECHNIQUE: Informed written consent was obtained from the patient and her husband after a thorough discussion of the procedural risks, benefits and alternatives. All questions were addressed. Maximal Sterile Barrier Technique was utilized including caps, mask, sterile gowns, sterile gloves, sterile drape, hand hygiene and skin antiseptic. A timeout was performed prior to the initiation of the procedure. The right groin was prepped and draped in the usual sterile fashion. Using a micropuncture kit and the modified Seldinger technique, access was gained to the right common femoral artery and an 8 French sheath was placed. Real-time ultrasound guidance was utilized for vascular access including the acquisition of a permanent ultrasound image documenting patency of the accessed vessel. Under fluoroscopy, a 4 [place] Berenstein 2 catheter was navigated over a 0.035" Terumo Glidewire into the aortic arch. The catheter was placed  into the left common carotid artery. Frontal and lateral angiograms of the neck  were obtained. The catheter was then placed into the left internal carotid artery. Frontal and lateral angiograms of the head were obtained. Next, the catheter was placed into the left external carotid artery. Frontal and lateral angiograms of the neck and head/face were obtained. Left sphenopalatine artery embolization was then carried out as described below. Next, the Envoy catheter was retracted into the aortic arch and then navigated to the right common carotid artery. Frontal and lateral angiograms of the neck were obtained. The catheter was then placed into the right internal carotid artery. Frontal and lateral angiograms of the head were obtained. Attempts to navigate the Envoy catheter into the right external carotid artery proved unsuccessful due to branch takeoff angle. The Envoy catheter was then exchanged over the wire and under biplane roadmap for a 4 [place] Berenstein catheter. Next, the catheter was placed into the right external carotid artery. Frontal and lateral angiograms of the neck and head/face were obtained. The Berenstein 2 catheter was then exchanged over the wire and under biplane roadmap for a 6 [place] envoy which was placed into into the right external carotid artery. Right sphenopalatine artery embolization was then carried out as described below. The catheter was subsequently withdrawn. A right common femoral artery angiogram was obtained in right anterior oblique view. FINDINGS: 1. Normal caliber of the right common femoral artery, adequate for vascular access and closure device utilization. 2. Prominent mucosal blush in the nasal cavity on the left with contrast pooling in the inferior and middle meatus. 3. Normal appearing right nasal cavity mucosal blush. 4. Normal appearance of the bilateral carotid bifurcation without hemodynamically significant stenosis. 5. Brisk contrast opacification of the bilateral MCA and ACA vascular tree without significant vascular abnormality. PROCEDURE:  After left-sided diagnostic angiogram, the Berenstein 2 catheter was exchanged over the wire and biplane roadmap for a 6 French envoy DA which was placed into the left external carotid artery. Magnified frontal and lateral angiograms of the face were obtained. Using biplane roadmap, a Prowler select Plus microcatheter was navigated over a synchro 2 micro guidewire into the left sphenopalatine artery. Magnified frontal and lateral angiograms were obtained via microcatheter contrast injection. Subsequently, left sphenopalatine artery embolization was carried out with 250-350 microns PVA particles suspension with iodine contrast for opacification. Follow-up angiogram showed near complete embolization. The catheter was slightly pulled back more proximally and additional embolization with PVA particles was performed. The microcatheter was retracted under continuous aspiration. 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 The Envoy catheter was then placed into the left internal carotid artery. Frontal and lateral angiograms of the head were obtained with no evidence of nontarget embolization. Mild nasal septum blush is noted via ethmoidal branches of the left ophthalmic artery. After right-sided diagnostic angiogram, a Prowler select Plus microcatheter was navigated over a synchro 2 micro guidewire into the right sphenopalatine artery under biplane roadmap guidance. Magnified frontal and lateral angiograms were obtained via microcatheter contrast injection. Subsequently, right sphenopalatine artery embolization was carried out with 250-350 microns PVA particles suspension with iodine contrast for opacification. 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 The Envoy catheter was then placed into the right internal carotid artery. Frontal and lateral angiograms of the head were obtained with no evidence of nontarget embolization.  At the end of the procedure, the 8 French right common femoral artery sheath was exchanged over the wire  for a Perclose ProGlide which was utilized for access closure. Immediate hemostasis was achieved. IMPRESSION: 1. Prominent mucosal blush in the left nasal cavity with active contrast extravasation. 2. Successful and uncomplicated endovascular embolization of the bilateral sphenopalatine arteries with polyvinyl alcohol particles for treatment of epistaxis refractory to nasal packing. 3. Residual mucosal blush of the nasal septum via anterior ethmoidal branches of the left ophthalmic artery. PLAN: Patient will remain intubated due to aspiration risk and will be transferred to ICU for continued management. Electronically Signed   By: Pedro Earls M.D.   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 Presented to there ED at [facility] with generalized weakness, and difficulty ambulating. Patient developed acute bleeding of the left nare on 11.14.22 at 22:30. Rhinorocket placed for hemostasis was unsuccessful. 8 cm Merocel sponge and Afrin attempted by ENT also unsuccessful. ENT placed a Epistat catheter with 19 ml balloon. She comes to our service for a cerebral angiogram with endovascular epistaxis embolization. EXAM: ULTRASOUND-GUIDED VASCULAR ACCESS DIAGNOSTIC CEREBRAL ANGIOGRAM VASCULAR EMBOLIZATION OF BILATERAL SPHENOPALATINE ARTERIES COMPARISON:  Katelyn Lamb. MEDICATIONS: Ancef 2 gm IV. The antibiotic was administered within 1 hour of the procedure. ANESTHESIA/SEDATION: The procedure was performed under general anesthesia. CONTRAST:  170 mL of Omnipaque 300 milligram/mL FLUOROSCOPY TIME:  Fluoroscopy Time: 45 minutes 42 seconds (1979  mGy). COMPLICATIONS: Katelyn Lamb immediate. TECHNIQUE: Informed written consent was obtained from the patient and her husband after a thorough discussion of the procedural risks, benefits and alternatives. All questions were addressed. Maximal Sterile Barrier Technique was utilized including caps, mask, sterile gowns, sterile gloves, sterile drape, hand hygiene and skin antiseptic. A timeout was performed prior to the initiation of the procedure. The right groin was prepped and draped in the usual sterile fashion. Using a micropuncture kit and the modified Seldinger technique, access was gained to the right common femoral artery and an 8 French sheath was placed. Real-time ultrasound guidance was utilized for vascular access including the acquisition of a permanent ultrasound image documenting patency of the accessed vessel. Under fluoroscopy, a 4 [place] Berenstein 2 catheter was navigated over a 0.035" Terumo Glidewire into the aortic arch. The catheter was placed into the left common carotid artery. Frontal and lateral angiograms of the neck were obtained. The catheter was then placed into the left internal carotid artery. Frontal and lateral angiograms of the head were obtained. Next, the catheter was placed into the left external carotid artery. Frontal and lateral angiograms of the neck and head/face were obtained. Left sphenopalatine artery embolization was then carried out as described below. Next, the Envoy catheter was retracted into the aortic arch and then navigated to the right common carotid artery. Frontal and lateral angiograms of the neck were obtained. The catheter was then placed into the right internal carotid artery. Frontal and lateral angiograms of the head were obtained. Attempts to navigate the Envoy catheter into the right external carotid artery proved unsuccessful due to branch takeoff angle. The Envoy catheter was then exchanged over the wire and under biplane roadmap for a 4 [place] Berenstein  catheter. Next, the catheter was placed into the right external carotid artery. Frontal and lateral angiograms of the neck and head/face were obtained. The Berenstein 2 catheter was then exchanged over the wire and  under biplane roadmap for a 6 [place] envoy which was placed into into the right external carotid artery. Right sphenopalatine artery embolization was then carried out as described below. The catheter was subsequently withdrawn. A right common femoral artery angiogram was obtained in right anterior oblique view. FINDINGS: 1. Normal caliber of the right common femoral artery, adequate for vascular access and closure device utilization. 2. Prominent mucosal blush in the nasal cavity on the left with contrast pooling in the inferior and middle meatus. 3. Normal appearing right nasal cavity mucosal blush. 4. Normal appearance of the bilateral carotid bifurcation without hemodynamically significant stenosis. 5. Brisk contrast opacification of the bilateral MCA and ACA vascular tree without significant vascular abnormality. PROCEDURE: After left-sided diagnostic angiogram, the Berenstein 2 catheter was exchanged over the wire and biplane roadmap for a 6 [place] envoy DA which was placed into the left external carotid artery. Magnified frontal and lateral angiograms of the face were obtained. Using biplane roadmap, a Prowler select Plus microcatheter was navigated over a synchro 2 micro guidewire into the left sphenopalatine artery. Magnified frontal and lateral angiograms were obtained via microcatheter contrast injection. Subsequently, left sphenopalatine artery embolization was carried out with 250-350 microns PVA particles suspension with iodine contrast for opacification. Follow-up angiogram showed near complete embolization. The catheter was slightly pulled back more proximally and additional embolization with PVA particles was performed. The microcatheter was retracted under continuous aspiration. 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 The Envoy catheter was then placed into the left internal carotid artery. Frontal and lateral angiograms of the head were obtained with no evidence of nontarget embolization. Mild nasal septum blush is noted via ethmoidal branches of the left ophthalmic artery. After right-sided diagnostic angiogram, a Prowler select Plus microcatheter was navigated over a synchro 2 micro guidewire into the right sphenopalatine artery under biplane roadmap guidance. Magnified frontal and lateral angiograms were obtained via microcatheter contrast injection. Subsequently, right sphenopalatine artery embolization was carried out with 250-350 microns PVA particles suspension with iodine contrast for opacification. 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 The Envoy catheter was then placed into the right internal carotid artery. Frontal and lateral angiograms of the head were obtained with no evidence of nontarget embolization. At the end of the procedure, the 8 French right common femoral artery sheath was exchanged over the wire for a Perclose ProGlide which was utilized for access closure. Immediate hemostasis was achieved. IMPRESSION: 1. Prominent mucosal blush in the left nasal cavity with active contrast extravasation. 2. Successful and uncomplicated endovascular embolization of the bilateral sphenopalatine arteries with polyvinyl alcohol particles for treatment of epistaxis refractory to nasal packing. 3. Residual mucosal blush of the nasal septum via anterior ethmoidal branches of the left ophthalmic artery. PLAN: Patient will remain intubated due to aspiration risk and will be transferred to ICU for continued management. Electronically Signed   By: Pedro Earls M.D.   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 Presented to there ED at [facility] with generalized weakness, and difficulty ambulating. Patient developed acute bleeding of the left nare on 11.14.22 at 22:30. Rhinorocket placed for hemostasis was unsuccessful. 8 cm Merocel sponge and Afrin attempted by ENT also unsuccessful. ENT  placed a Epistat catheter with 19 ml balloon. She comes to our service for a cerebral angiogram with endovascular epistaxis embolization. EXAM: ULTRASOUND-GUIDED VASCULAR ACCESS DIAGNOSTIC CEREBRAL ANGIOGRAM VASCULAR EMBOLIZATION OF BILATERAL SPHENOPALATINE ARTERIES COMPARISON:  Katelyn Lamb. MEDICATIONS: Ancef 2 gm IV. The antibiotic was administered within 1 hour of the procedure. ANESTHESIA/SEDATION: The procedure was performed under general anesthesia. CONTRAST:  170 mL of Omnipaque 300 milligram/mL FLUOROSCOPY TIME:  Fluoroscopy Time: 45 minutes 42 seconds (1979 mGy). COMPLICATIONS: Katelyn Lamb immediate. TECHNIQUE: Informed written consent was obtained from the patient and her husband after a thorough discussion of the procedural risks, benefits and alternatives. All questions were addressed. Maximal Sterile Barrier Technique was utilized including caps, mask, sterile gowns, sterile gloves, sterile drape, hand hygiene and skin antiseptic. A timeout was performed prior to the initiation of the procedure. The right groin was prepped and draped in the usual sterile fashion. Using a micropuncture kit and the modified Seldinger technique, access was gained to the right common femoral artery and an 8 French sheath was placed. Real-time ultrasound guidance was utilized for vascular access including the acquisition of a permanent ultrasound image documenting patency of the accessed vessel. Under fluoroscopy, a 4 [place] Berenstein 2 catheter was navigated over a  0.035" Terumo Glidewire into the aortic arch. The catheter was placed into the left common carotid artery. Frontal and lateral angiograms of the neck were obtained. The catheter was then placed into the left internal carotid artery. Frontal and lateral angiograms of the head were obtained. Next, the catheter was placed into the left external carotid artery. Frontal and lateral angiograms of the neck and head/face were obtained. Left sphenopalatine artery embolization was then carried out as described below. Next, the Envoy catheter was retracted into the aortic arch and then navigated to the right common carotid artery. Frontal and lateral angiograms of the neck were obtained. The catheter was then placed into the right internal carotid artery. Frontal and lateral angiograms of the head were obtained. Attempts to navigate the Envoy catheter into the right external carotid artery proved unsuccessful due to branch takeoff angle. The Envoy catheter was then exchanged over the wire and under biplane roadmap for a 4 [place] Berenstein catheter. Next, the catheter was placed into the right external carotid artery. Frontal and lateral angiograms of the neck and head/face were obtained. The Berenstein 2 catheter was then exchanged over the wire and under biplane roadmap for a 6 [place] envoy which was placed into into the right external carotid artery. Right sphenopalatine artery embolization was then carried out as described below. The catheter was subsequently withdrawn. A right common femoral artery angiogram was obtained in right anterior oblique view. FINDINGS: 1. Normal caliber of the right common femoral artery, adequate for vascular access and closure device utilization. 2. Prominent mucosal blush in the nasal cavity on the left with contrast pooling in the inferior and middle meatus. 3. Normal appearing right nasal cavity mucosal blush. 4. Normal appearance of the bilateral carotid bifurcation without hemodynamically  significant stenosis. 5. Brisk contrast opacification of the bilateral MCA and ACA vascular tree without significant vascular abnormality. PROCEDURE: After left-sided diagnostic angiogram, the Berenstein 2 catheter was exchanged over the wire and biplane roadmap for a 6 French envoy DA which was placed into the left external carotid artery. Magnified frontal and lateral angiograms of the face were obtained. Using biplane roadmap, a Prowler select Plus microcatheter was navigated over a synchro 2 micro guidewire into the left sphenopalatine artery. Magnified frontal and lateral angiograms  were obtained via microcatheter contrast injection. Subsequently, left sphenopalatine artery embolization was carried out with 250-350 microns PVA particles suspension with iodine contrast for opacification. Follow-up angiogram showed near complete embolization. The catheter was slightly pulled back more proximally and additional embolization with PVA particles was performed. The microcatheter was retracted under continuous aspiration. 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 The Envoy catheter was then placed into the left internal carotid artery. Frontal and lateral angiograms of the head were obtained with no evidence of nontarget embolization. Mild nasal septum blush is noted via ethmoidal branches of the left ophthalmic artery. After right-sided diagnostic angiogram, a Prowler select Plus microcatheter was navigated over a synchro 2 micro guidewire into the right sphenopalatine artery under biplane roadmap guidance. Magnified frontal and lateral angiograms were obtained via microcatheter contrast injection. Subsequently, right sphenopalatine artery embolization was carried out with 250-350 microns PVA particles suspension with iodine contrast for opacification. 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 The Envoy catheter was  then placed into the right internal carotid artery. Frontal and lateral angiograms of the head were obtained with no evidence of nontarget embolization. At the end of the procedure, the 8 French right common femoral artery sheath was exchanged over the wire for a Perclose ProGlide which was utilized for access closure. Immediate hemostasis was achieved. IMPRESSION: 1. Prominent mucosal blush in the left nasal cavity with active contrast extravasation. 2. Successful and uncomplicated endovascular embolization of the bilateral sphenopalatine arteries with polyvinyl alcohol particles for treatment of epistaxis refractory to nasal packing. 3. Residual mucosal blush of the nasal septum via anterior ethmoidal branches of the left ophthalmic artery. PLAN: Patient will remain intubated due to aspiration risk and will be transferred to ICU for continued management. Electronically Signed   By: Pedro Earls M.D.   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 Presented to there ED at [facility] with generalized weakness, and difficulty ambulating. Patient developed acute bleeding of the left nare on 11.14.22 at 22:30. Rhinorocket placed for hemostasis was unsuccessful. 8 cm Merocel sponge and Afrin attempted by ENT also unsuccessful. ENT placed a Epistat catheter with 19 ml balloon. She comes to our service for a cerebral angiogram with endovascular epistaxis embolization. EXAM: ULTRASOUND-GUIDED VASCULAR ACCESS DIAGNOSTIC CEREBRAL ANGIOGRAM VASCULAR EMBOLIZATION OF BILATERAL SPHENOPALATINE ARTERIES COMPARISON:  Katelyn Lamb. MEDICATIONS: Ancef 2 gm IV. The antibiotic was administered within 1 hour of the procedure. ANESTHESIA/SEDATION: The procedure was  performed under general anesthesia. CONTRAST:  170 mL of Omnipaque 300 milligram/mL FLUOROSCOPY TIME:  Fluoroscopy Time: 45 minutes 42 seconds (1979 mGy). COMPLICATIONS: Katelyn Lamb immediate. TECHNIQUE: Informed written consent was obtained from the patient and her husband after a thorough discussion of the procedural risks, benefits and alternatives. All questions were addressed. Maximal Sterile Barrier Technique was utilized including caps, mask, sterile gowns, sterile gloves, sterile drape, hand hygiene and skin antiseptic. A timeout was performed prior to the initiation of the procedure. The right groin was prepped and draped in the usual sterile fashion. Using a micropuncture kit and the modified Seldinger technique, access was gained to the right common femoral artery and an 8 French sheath was placed. Real-time ultrasound guidance was utilized for vascular access including the acquisition of  a permanent ultrasound image documenting patency of the accessed vessel. Under fluoroscopy, a 4 [place] Berenstein 2 catheter was navigated over a 0.035" Terumo Glidewire into the aortic arch. The catheter was placed into the left common carotid artery. Frontal and lateral angiograms of the neck were obtained. The catheter was then placed into the left internal carotid artery. Frontal and lateral angiograms of the head were obtained. Next, the catheter was placed into the left external carotid artery. Frontal and lateral angiograms of the neck and head/face were obtained. Left sphenopalatine artery embolization was then carried out as described below. Next, the Envoy catheter was retracted into the aortic arch and then navigated to the right common carotid artery. Frontal and lateral angiograms of the neck were obtained. The catheter was then placed into the right internal carotid artery. Frontal and lateral angiograms of the head were obtained. Attempts to navigate the Envoy catheter into the right external carotid artery  proved unsuccessful due to branch takeoff angle. The Envoy catheter was then exchanged over the wire and under biplane roadmap for a 4 [place] Berenstein catheter. Next, the catheter was placed into the right external carotid artery. Frontal and lateral angiograms of the neck and head/face were obtained. The Berenstein 2 catheter was then exchanged over the wire and under biplane roadmap for a 6 [place] envoy which was placed into into the right external carotid artery. Right sphenopalatine artery embolization was then carried out as described below. The catheter was subsequently withdrawn. A right common femoral artery angiogram was obtained in right anterior oblique view. FINDINGS: 1. Normal caliber of the right common femoral artery, adequate for vascular access and closure device utilization. 2. Prominent mucosal blush in the nasal cavity on the left with contrast pooling in the inferior and middle meatus. 3. Normal appearing right nasal cavity mucosal blush. 4. Normal appearance of the bilateral carotid bifurcation without hemodynamically significant stenosis. 5. Brisk contrast opacification of the bilateral MCA and ACA vascular tree without significant vascular abnormality. PROCEDURE: After left-sided diagnostic angiogram, the Berenstein 2 catheter was exchanged over the wire and biplane roadmap for a 6 French envoy DA which was placed into the left external carotid artery. Magnified frontal and lateral angiograms of the face were obtained. Using biplane roadmap, a Prowler select Plus microcatheter was navigated over a synchro 2 micro guidewire into the left sphenopalatine artery. Magnified frontal and lateral angiograms were obtained via microcatheter contrast injection. Subsequently, left sphenopalatine artery embolization was carried out with 250-350 microns PVA particles suspension with iodine contrast for opacification. Follow-up angiogram showed near complete embolization. The catheter was slightly pulled  back more proximally and additional embolization with PVA particles was performed. The microcatheter was retracted under continuous aspiration. 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 The Envoy catheter was then placed into the left internal carotid artery. Frontal and lateral angiograms of the head were obtained with no evidence of nontarget embolization. Mild nasal septum blush is noted via ethmoidal branches of the left ophthalmic artery. After right-sided diagnostic angiogram, a Prowler select Plus microcatheter was navigated over a synchro 2 micro guidewire into the right sphenopalatine artery under biplane roadmap guidance. Magnified frontal and lateral angiograms were obtained via microcatheter contrast injection. Subsequently, right sphenopalatine artery embolization was carried out with 250-350 microns PVA particles suspension with iodine contrast for opacification. 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 The Envoy catheter was then placed into the right internal carotid artery. Frontal and  lateral angiograms of the head were obtained with no evidence of nontarget embolization. At the end of the procedure, the 8 French right common femoral artery sheath was exchanged over the wire for a Perclose ProGlide which was utilized for access closure. Immediate hemostasis was achieved. IMPRESSION: 1. Prominent mucosal blush in the left nasal cavity with active contrast extravasation. 2. Successful and uncomplicated endovascular embolization of the bilateral sphenopalatine arteries with polyvinyl alcohol particles for treatment of epistaxis refractory to nasal packing. 3. Residual mucosal blush of the nasal septum via anterior ethmoidal branches of the left ophthalmic artery. PLAN: Patient will remain intubated due to aspiration risk and will be transferred to ICU for continued management. Electronically Signed    By: Pedro Earls M.D.   On: 07/18/2021 14:08    Microbiology: Recent Results (from the past 240 hour(s))  Culture, blood (x 2)     Status: Katelyn Lamb   Collection Time: 07/27/21  9:15 AM   Specimen: BLOOD  Result Value Ref Range Status   Specimen Description BLOOD RIGHT ANTECUBITAL  Final   Special Requests   Final    BOTTLES DRAWN AEROBIC AND ANAEROBIC Blood Culture results may not be optimal due to an excessive volume of blood received in culture bottles   Culture   Final    NO GROWTH 5 DAYS Performed at [facility], [place] [address], [place], [place] [address]    Report Status 08/01/2021 FINAL  Final  Culture, blood (x 2)     Status: Katelyn Lamb   Collection Time: 07/27/21  9:16 AM   Specimen: BLOOD RIGHT HAND  Result Value Ref Range Status   Specimen Description BLOOD RIGHT HAND  Final   Special Requests   Final    BOTTLES DRAWN AEROBIC AND ANAEROBIC Blood Culture results may not be optimal due to an excessive volume of blood received in culture bottles   Culture   Final    NO GROWTH 5 DAYS Performed at [facility], [place] [address] [place], [place] [address]    Report Status 08/01/2021 FINAL  Final  Urine Culture     Status: Abnormal   Collection Time: 07/27/21  2:46 PM   Specimen: Urine, Suprapubic  Result Value Ref Range Status   Specimen Description URINE, SUPRAPUBIC  Final   Special Requests   Final    Katelyn Lamb Performed at [facility], [place] [address] [place], [place] [address]    Culture (A)  Final    >=100,000 COLONIES/mL ENTEROCOCCUS FAECALIS 70,000 COLONIES/mL KLEBSIELLA PNEUMONIAE Confirmed Extended Spectrum Beta-Lactamase Producer (ESBL).  In bloodstream infections from ESBL organisms, carbapenems are preferred over piperacillin/tazobactam. They are shown to have a lower risk of mortality.    Report Status 07/29/2021 FINAL  Final   Organism ID, Bacteria ENTEROCOCCUS FAECALIS (A)  Final   Organism ID, Bacteria KLEBSIELLA PNEUMONIAE (A)   Final      Susceptibility   Enterococcus faecalis - MIC*    AMPICILLIN <=2 SENSITIVE Sensitive     NITROFURANTOIN <=16 SENSITIVE Sensitive     VANCOMYCIN 1 SENSITIVE Sensitive     * >=100,000 COLONIES/mL ENTEROCOCCUS FAECALIS   Klebsiella pneumoniae - MIC*    AMPICILLIN >=32 RESISTANT Resistant     CEFAZOLIN >=64 RESISTANT Resistant     CEFEPIME >=32 RESISTANT Resistant     CEFTRIAXONE >=64 RESISTANT Resistant     CIPROFLOXACIN >=4 RESISTANT Resistant     GENTAMICIN <=1 SENSITIVE Sensitive     IMIPENEM <=0.25 SENSITIVE  Sensitive     NITROFURANTOIN 128 RESISTANT Resistant     TRIMETH/SULFA >=320 RESISTANT Resistant     AMPICILLIN/SULBACTAM >=32 RESISTANT Resistant     PIP/TAZO 32 INTERMEDIATE Intermediate     * 70,000 COLONIES/mL KLEBSIELLA PNEUMONIAE     Labs: Basic Metabolic Panel: Recent Labs  Lab 07/26/21 0933 07/27/21 0856 07/28/21 0316  NA 138 137 139  K 3.6 3.8 3.6  CL 102 102 105  CO2 '30 27 27  ' GLUCOSE 105* 107* 138*  BUN '12 13 15  ' CREATININE 0.81 0.77 0.65  CALCIUM 8.6* 8.7* 8.7*    Liver Function Tests: Recent Labs  Lab 07/26/21 0933 07/27/21 0856 07/28/21 0316  AST '23 25 22  ' ALT 37 35 35  ALKPHOS 97 104 89  BILITOT 0.3 0.7 0.4  PROT 5.6* 6.0* 5.6*  ALBUMIN 2.6* 2.8* 2.5*    No results for input(s): LIPASE, AMYLASE in the last 168 hours. No results for input(s): AMMONIA in the last 168 hours. CBC: Recent Labs  Lab 07/26/21 0933 07/27/21 0856 07/28/21 0316  WBC 10.9* 12.9* 12.1*  NEUTROABS  --  7.8*  --   HGB 10.3* 10.9* 9.8*  HCT 33.4* 33.7* 31.3*  MCV 99.1 96.6 97.5  PLT 177 191 191    Cardiac Enzymes: No results for input(s): CKTOTAL, CKMB, CKMBINDEX, TROPONINI in the last 168 hours. BNP: BNP (last 3 results) No results for input(s): BNP in the last 8760 hours.  ProBNP (last 3 results) No results for input(s): PROBNP in the last 8760 hours.  CBG: No results for input(s): GLUCAP in the last 168 hours.     Signed:  Little Ishikawa DO   [facility] 08/02/2021, 7:49 AM

## 2021-08-02 NOTE — Care Management Important Message (Signed)
Important Message  Patient Details  Name: Katelyn Lamb MRN: [id number] Date of Birth: [birth year]   Medicare Important Message Given:  Yes     Hannah Beat 08/02/2021, 11:53 AM

## 2021-08-02 NOTE — TOC Transition Note (Signed)
Transition of Care [facility]) - CM/SW Discharge Note   Patient Details  Name: Katelyn Lamb MRN: [id number] Date of Birth: 29-Jul-1946  Transition of Care [facility]) CM/SW Contact:  Vinie Sill, LCSW Phone Number: 08/02/2021, 12:25 PM   Clinical Narrative:     Discharge pending -Pending Covid Test Results   Patient will Discharge to: [facility] Discharge Date:08/02/2021 Family Notified: spouse  Transport [id number]   Per MD patient is ready for discharge. RN, patient, and facility notified of discharge. Discharge Summary sent to facility. RN given number for [phone]. Ambulance transport requested for patient.   Clinical Social Worker signing off.  Thurmond Butts, MSW, LCSW Clinical Social Worker     Final next level of care: Skilled Nursing Facility Barriers to Discharge: Barriers Resolved   Patient Goals and CMS Choice Patient states their goals for this hospitalization and ongoing recovery are:: To go to rehab and go home. CMS Medicare.gov Compare Post Acute Care list provided to:: Patient Choice offered to / list presented to : Patient, Spouse  Discharge Placement PASRR number recieved: 07/21/21            Patient chooses bed at: [facility] Patient to be transferred to facility by: [place] Name of family member notified: spouse Patient and family notified of of transfer: 08/02/21  Discharge Plan and Services   Discharge Planning Services: CM Consult Post Acute Care Choice: [place]            DME Agency: [place]        Social Determinants of Health (SDOH) Interventions     Readmission Risk Interventions No flowsheet data found.

## 2021-08-02 NOTE — TOC Progression Note (Signed)
Transition of Care [facility]) - Progression Note    Patient Details  Name: LAVONA NORSWORTHY MRN: [id number] Date of Birth: 06/30/1946  Transition of Care [facility]) CM/SW [place], [place] Phone Number: 08/02/2021, 10:17 AM  Clinical Narrative:     CSW spoke with patient's spouse,Stephen. He confirmed his decision of [facility].  Confirmed w/[facility]/Kathy- SNF can admit today  Tammy w/ provided  HTA- authorization # [id number] for 7 days - PTAR #  [phone]- CSW informed SNF choice is [facility].  Updated MD & RN- udpated d/c summary and covid test needed.  Thurmond Butts, MSW, LCSW Clinical Social Worker     Expected Discharge Plan: Skilled Nursing Facility Barriers to Discharge: Continued Medical Work up  Expected Discharge Plan and Services Expected Discharge Plan: [place]   Discharge Planning Services: CM Consult Post Acute Care Choice: [place] Living arrangements for the past 2 months: Single Family Home                   DME Agency: [place] Agency: [facility]         Social Determinants of Health (SDOH) Interventions    Readmission Risk Interventions No flowsheet data found.

## 2021-08-03 DIAGNOSIS — R0689 Other abnormalities of breathing: Secondary | ICD-10-CM | POA: Diagnosis not present

## 2021-08-03 DIAGNOSIS — F419 Anxiety disorder, unspecified: Secondary | ICD-10-CM | POA: Diagnosis present

## 2021-08-03 DIAGNOSIS — I2609 Other pulmonary embolism with acute cor pulmonale: Secondary | ICD-10-CM | POA: Diagnosis present

## 2021-08-03 DIAGNOSIS — Z743 Need for continuous supervision: Secondary | ICD-10-CM | POA: Diagnosis not present

## 2021-08-03 DIAGNOSIS — F329 Major depressive disorder, single episode, unspecified: Secondary | ICD-10-CM | POA: Diagnosis not present

## 2021-08-03 DIAGNOSIS — J9601 Acute respiratory failure with hypoxia: Secondary | ICD-10-CM | POA: Diagnosis not present

## 2021-08-03 DIAGNOSIS — E46 Unspecified protein-calorie malnutrition: Secondary | ICD-10-CM | POA: Diagnosis not present

## 2021-08-03 DIAGNOSIS — R770 Abnormality of albumin: Secondary | ICD-10-CM | POA: Diagnosis not present

## 2021-08-03 DIAGNOSIS — R109 Unspecified abdominal pain: Secondary | ICD-10-CM | POA: Diagnosis not present

## 2021-08-03 DIAGNOSIS — J189 Pneumonia, unspecified organism: Secondary | ICD-10-CM | POA: Diagnosis not present

## 2021-08-03 DIAGNOSIS — K219 Gastro-esophageal reflux disease without esophagitis: Secondary | ICD-10-CM | POA: Diagnosis not present

## 2021-08-03 DIAGNOSIS — R000 Tachycardia, unspecified: Secondary | ICD-10-CM | POA: Diagnosis not present

## 2021-08-03 DIAGNOSIS — R778 Other specified abnormalities of plasma proteins: Secondary | ICD-10-CM | POA: Diagnosis present

## 2021-08-03 DIAGNOSIS — I10 Essential (primary) hypertension: Secondary | ICD-10-CM | POA: Diagnosis present

## 2021-08-03 DIAGNOSIS — I82441 Acute embolism and thrombosis of right tibial vein: Secondary | ICD-10-CM | POA: Diagnosis present

## 2021-08-03 DIAGNOSIS — G0490 Encephalitis and encephalomyelitis, unspecified: Secondary | ICD-10-CM | POA: Diagnosis present

## 2021-08-03 DIAGNOSIS — R404 Transient alteration of awareness: Secondary | ICD-10-CM | POA: Diagnosis not present

## 2021-08-03 DIAGNOSIS — Z7952 Long term (current) use of systemic steroids: Secondary | ICD-10-CM | POA: Diagnosis not present

## 2021-08-03 DIAGNOSIS — R040 Epistaxis: Secondary | ICD-10-CM | POA: Diagnosis not present

## 2021-08-03 DIAGNOSIS — M6281 Muscle weakness (generalized): Secondary | ICD-10-CM | POA: Diagnosis not present

## 2021-08-03 DIAGNOSIS — B004 Herpesviral encephalitis: Secondary | ICD-10-CM | POA: Diagnosis not present

## 2021-08-03 DIAGNOSIS — G990 Autonomic neuropathy in diseases classified elsewhere: Secondary | ICD-10-CM | POA: Diagnosis not present

## 2021-08-03 DIAGNOSIS — Z8249 Family history of ischemic heart disease and other diseases of the circulatory system: Secondary | ICD-10-CM | POA: Diagnosis not present

## 2021-08-03 DIAGNOSIS — G8222 Paraplegia, incomplete: Secondary | ICD-10-CM | POA: Diagnosis not present

## 2021-08-03 DIAGNOSIS — G894 Chronic pain syndrome: Secondary | ICD-10-CM | POA: Diagnosis not present

## 2021-08-03 DIAGNOSIS — F411 Generalized anxiety disorder: Secondary | ICD-10-CM | POA: Diagnosis not present

## 2021-08-03 DIAGNOSIS — D539 Nutritional anemia, unspecified: Secondary | ICD-10-CM | POA: Diagnosis not present

## 2021-08-03 DIAGNOSIS — Z66 Do not resuscitate: Secondary | ICD-10-CM | POA: Diagnosis present

## 2021-08-03 DIAGNOSIS — R509 Fever, unspecified: Secondary | ICD-10-CM | POA: Diagnosis not present

## 2021-08-03 DIAGNOSIS — L89616 Pressure-induced deep tissue damage of right heel: Secondary | ICD-10-CM | POA: Diagnosis present

## 2021-08-03 DIAGNOSIS — D72829 Elevated white blood cell count, unspecified: Secondary | ICD-10-CM | POA: Diagnosis not present

## 2021-08-03 DIAGNOSIS — M4184 Other forms of scoliosis, thoracic region: Secondary | ICD-10-CM | POA: Diagnosis present

## 2021-08-03 DIAGNOSIS — N319 Neuromuscular dysfunction of bladder, unspecified: Secondary | ICD-10-CM | POA: Diagnosis present

## 2021-08-03 DIAGNOSIS — Z86718 Personal history of other venous thrombosis and embolism: Secondary | ICD-10-CM | POA: Diagnosis not present

## 2021-08-03 DIAGNOSIS — I517 Cardiomegaly: Secondary | ICD-10-CM | POA: Diagnosis not present

## 2021-08-03 DIAGNOSIS — R338 Other retention of urine: Secondary | ICD-10-CM | POA: Diagnosis not present

## 2021-08-03 DIAGNOSIS — R140 Abdominal distension (gaseous): Secondary | ICD-10-CM | POA: Diagnosis not present

## 2021-08-03 DIAGNOSIS — Z7902 Long term (current) use of antithrombotics/antiplatelets: Secondary | ICD-10-CM | POA: Diagnosis not present

## 2021-08-03 DIAGNOSIS — Z9359 Other cystostomy status: Secondary | ICD-10-CM | POA: Diagnosis not present

## 2021-08-03 DIAGNOSIS — Z87440 Personal history of urinary (tract) infections: Secondary | ICD-10-CM | POA: Diagnosis not present

## 2021-08-03 DIAGNOSIS — A419 Sepsis, unspecified organism: Secondary | ICD-10-CM | POA: Diagnosis not present

## 2021-08-03 DIAGNOSIS — R0902 Hypoxemia: Secondary | ICD-10-CM | POA: Diagnosis not present

## 2021-08-03 DIAGNOSIS — R0682 Tachypnea, not elsewhere classified: Secondary | ICD-10-CM | POA: Diagnosis not present

## 2021-08-03 DIAGNOSIS — R7303 Prediabetes: Secondary | ICD-10-CM | POA: Diagnosis not present

## 2021-08-03 DIAGNOSIS — Z20822 Contact with and (suspected) exposure to covid-19: Secondary | ICD-10-CM | POA: Diagnosis present

## 2021-08-03 DIAGNOSIS — Z880 Allergy status to penicillin: Secondary | ICD-10-CM | POA: Diagnosis not present

## 2021-08-03 DIAGNOSIS — R8271 Bacteriuria: Secondary | ICD-10-CM | POA: Diagnosis not present

## 2021-08-03 DIAGNOSIS — N390 Urinary tract infection, site not specified: Secondary | ICD-10-CM | POA: Diagnosis not present

## 2021-08-03 DIAGNOSIS — Z1159 Encounter for screening for other viral diseases: Secondary | ICD-10-CM | POA: Diagnosis not present

## 2021-08-03 DIAGNOSIS — T83511A Infection and inflammatory reaction due to indwelling urethral catheter, initial encounter: Secondary | ICD-10-CM | POA: Diagnosis not present

## 2021-08-03 DIAGNOSIS — Z8619 Personal history of other infectious and parasitic diseases: Secondary | ICD-10-CM | POA: Diagnosis not present

## 2021-08-03 DIAGNOSIS — Z888 Allergy status to other drugs, medicaments and biological substances status: Secondary | ICD-10-CM | POA: Diagnosis not present

## 2021-08-03 DIAGNOSIS — Y95 Nosocomial condition: Secondary | ICD-10-CM | POA: Diagnosis not present

## 2021-08-03 DIAGNOSIS — G40909 Epilepsy, unspecified, not intractable, without status epilepticus: Secondary | ICD-10-CM | POA: Diagnosis present

## 2021-08-03 DIAGNOSIS — I2699 Other pulmonary embolism without acute cor pulmonale: Secondary | ICD-10-CM | POA: Diagnosis not present

## 2021-08-03 DIAGNOSIS — E872 Acidosis, unspecified: Secondary | ICD-10-CM | POA: Diagnosis present

## 2021-08-03 NOTE — Plan of Care (Signed)

## 2021-08-03 NOTE — Progress Notes (Signed)
Pt transported to [facility] in stable conditions, all belongings sent with husband. AVS and discharge packet given to PTAR transporters.

## 2021-08-06 ENCOUNTER — Inpatient Hospital Stay (HOSPITAL_COMMUNITY)
Admission: EM | Admit: 2021-08-06 | Discharge: 2021-08-16 | DRG: 175 | Disposition: A | Discharge status: Skilled Nursing Facility | Payer: PPO | Source: Skilled Nursing Facility | Attending: Internal Medicine | Admitting: Internal Medicine

## 2021-08-06 ENCOUNTER — Emergency Department (HOSPITAL_COMMUNITY): Payer: PPO

## 2021-08-06 ENCOUNTER — Other Ambulatory Visit: Payer: Self-pay

## 2021-08-06 ENCOUNTER — Encounter (HOSPITAL_COMMUNITY): Payer: Self-pay | Admitting: Student

## 2021-08-06 DIAGNOSIS — A419 Sepsis, unspecified organism: Secondary | ICD-10-CM | POA: Diagnosis not present

## 2021-08-06 DIAGNOSIS — I2699 Other pulmonary embolism without acute cor pulmonale: Secondary | ICD-10-CM | POA: Diagnosis present

## 2021-08-06 DIAGNOSIS — Z66 Do not resuscitate: Secondary | ICD-10-CM | POA: Diagnosis present

## 2021-08-06 DIAGNOSIS — Z880 Allergy status to penicillin: Secondary | ICD-10-CM

## 2021-08-06 DIAGNOSIS — Z79899 Other long term (current) drug therapy: Secondary | ICD-10-CM

## 2021-08-06 DIAGNOSIS — Z8249 Family history of ischemic heart disease and other diseases of the circulatory system: Secondary | ICD-10-CM

## 2021-08-06 DIAGNOSIS — G40909 Epilepsy, unspecified, not intractable, without status epilepticus: Secondary | ICD-10-CM

## 2021-08-06 DIAGNOSIS — G8222 Paraplegia, incomplete: Secondary | ICD-10-CM | POA: Diagnosis present

## 2021-08-06 DIAGNOSIS — I517 Cardiomegaly: Secondary | ICD-10-CM | POA: Diagnosis not present

## 2021-08-06 DIAGNOSIS — Z888 Allergy status to other drugs, medicaments and biological substances status: Secondary | ICD-10-CM

## 2021-08-06 DIAGNOSIS — L89616 Pressure-induced deep tissue damage of right heel: Secondary | ICD-10-CM | POA: Diagnosis present

## 2021-08-06 DIAGNOSIS — Z20822 Contact with and (suspected) exposure to covid-19: Secondary | ICD-10-CM | POA: Diagnosis present

## 2021-08-06 DIAGNOSIS — R509 Fever, unspecified: Secondary | ICD-10-CM

## 2021-08-06 DIAGNOSIS — M4184 Other forms of scoliosis, thoracic region: Secondary | ICD-10-CM | POA: Diagnosis present

## 2021-08-06 DIAGNOSIS — F419 Anxiety disorder, unspecified: Secondary | ICD-10-CM | POA: Diagnosis present

## 2021-08-06 DIAGNOSIS — R7989 Other specified abnormal findings of blood chemistry: Secondary | ICD-10-CM | POA: Diagnosis present

## 2021-08-06 DIAGNOSIS — Z8661 Personal history of infections of the central nervous system: Secondary | ICD-10-CM

## 2021-08-06 DIAGNOSIS — G894 Chronic pain syndrome: Secondary | ICD-10-CM | POA: Diagnosis present

## 2021-08-06 DIAGNOSIS — Z7952 Long term (current) use of systemic steroids: Secondary | ICD-10-CM

## 2021-08-06 DIAGNOSIS — R0902 Hypoxemia: Secondary | ICD-10-CM | POA: Diagnosis not present

## 2021-08-06 DIAGNOSIS — E872 Acidosis, unspecified: Secondary | ICD-10-CM | POA: Diagnosis present

## 2021-08-06 DIAGNOSIS — J189 Pneumonia, unspecified organism: Secondary | ICD-10-CM | POA: Diagnosis not present

## 2021-08-06 DIAGNOSIS — R0682 Tachypnea, not elsewhere classified: Secondary | ICD-10-CM | POA: Diagnosis not present

## 2021-08-06 DIAGNOSIS — G0490 Encephalitis and encephalomyelitis, unspecified: Secondary | ICD-10-CM | POA: Diagnosis present

## 2021-08-06 DIAGNOSIS — R0689 Other abnormalities of breathing: Secondary | ICD-10-CM | POA: Diagnosis not present

## 2021-08-06 DIAGNOSIS — Z9359 Other cystostomy status: Secondary | ICD-10-CM

## 2021-08-06 DIAGNOSIS — Y95 Nosocomial condition: Secondary | ICD-10-CM | POA: Diagnosis not present

## 2021-08-06 DIAGNOSIS — I2609 Other pulmonary embolism with acute cor pulmonale: Principal | ICD-10-CM | POA: Diagnosis present

## 2021-08-06 DIAGNOSIS — J9601 Acute respiratory failure with hypoxia: Secondary | ICD-10-CM | POA: Diagnosis present

## 2021-08-06 DIAGNOSIS — I10 Essential (primary) hypertension: Secondary | ICD-10-CM | POA: Diagnosis present

## 2021-08-06 DIAGNOSIS — K529 Noninfective gastroenteritis and colitis, unspecified: Secondary | ICD-10-CM | POA: Diagnosis present

## 2021-08-06 DIAGNOSIS — R0602 Shortness of breath: Secondary | ICD-10-CM

## 2021-08-06 DIAGNOSIS — K219 Gastro-esophageal reflux disease without esophagitis: Secondary | ICD-10-CM | POA: Diagnosis present

## 2021-08-06 DIAGNOSIS — Z8619 Personal history of other infectious and parasitic diseases: Secondary | ICD-10-CM

## 2021-08-06 DIAGNOSIS — R140 Abdominal distension (gaseous): Secondary | ICD-10-CM | POA: Diagnosis not present

## 2021-08-06 DIAGNOSIS — R778 Other specified abnormalities of plasma proteins: Secondary | ICD-10-CM | POA: Diagnosis present

## 2021-08-06 DIAGNOSIS — R000 Tachycardia, unspecified: Secondary | ICD-10-CM | POA: Diagnosis not present

## 2021-08-06 DIAGNOSIS — R8271 Bacteriuria: Secondary | ICD-10-CM | POA: Diagnosis present

## 2021-08-06 DIAGNOSIS — N319 Neuromuscular dysfunction of bladder, unspecified: Secondary | ICD-10-CM | POA: Diagnosis present

## 2021-08-06 DIAGNOSIS — R109 Unspecified abdominal pain: Secondary | ICD-10-CM | POA: Diagnosis present

## 2021-08-06 DIAGNOSIS — I82441 Acute embolism and thrombosis of right tibial vein: Secondary | ICD-10-CM | POA: Diagnosis present

## 2021-08-06 DIAGNOSIS — Z87891 Personal history of nicotine dependence: Secondary | ICD-10-CM

## 2021-08-06 LAB — URINALYSIS, ROUTINE W REFLEX MICROSCOPIC
Bilirubin Urine: NEGATIVE
Glucose, UA: NEGATIVE mg/dL
Hgb urine dipstick: NEGATIVE
Ketones, ur: NEGATIVE mg/dL
Nitrite: NEGATIVE
Protein, ur: NEGATIVE mg/dL
Specific Gravity, Urine: 1.014 (ref 1.005–1.030)
pH: 5 (ref 5.0–8.0)

## 2021-08-06 LAB — CBC WITH DIFFERENTIAL/PLATELET
Abs Immature Granulocytes: 0.47 10*3/uL — ABNORMAL HIGH (ref 0.00–0.07)
Basophils Absolute: 0.1 10*3/uL (ref 0.0–0.1)
Basophils Relative: 0 %
Eosinophils Absolute: 0.2 10*3/uL (ref 0.0–0.5)
Eosinophils Relative: 1 %
HCT: 42.2 % (ref 36.0–46.0)
Hemoglobin: 12.9 g/dL (ref 12.0–15.0)
Immature Granulocytes: 2 %
Lymphocytes Relative: 17 %
Lymphs Abs: 4.1 10*3/uL — ABNORMAL HIGH (ref 0.7–4.0)
MCH: 30.2 pg (ref 26.0–34.0)
MCHC: 30.6 g/dL (ref 30.0–36.0)
MCV: 98.8 fL (ref 80.0–100.0)
Monocytes Absolute: 1.3 10*3/uL — ABNORMAL HIGH (ref 0.1–1.0)
Monocytes Relative: 5 %
Neutro Abs: 18.2 10*3/uL — ABNORMAL HIGH (ref 1.7–7.7)
Neutrophils Relative %: 75 %
Platelets: 292 10*3/uL (ref 150–400)
RBC: 4.27 MIL/uL (ref 3.87–5.11)
RDW: 16.8 % — ABNORMAL HIGH (ref 11.5–15.5)
WBC: 24.4 10*3/uL — ABNORMAL HIGH (ref 4.0–10.5)
nRBC: 0.1 % (ref 0.0–0.2)

## 2021-08-06 LAB — I-STAT CHEM 8, ED
BUN: 33 mg/dL — ABNORMAL HIGH (ref 8–23)
Calcium, Ion: 1.04 mmol/L — ABNORMAL LOW (ref 1.15–1.40)
Chloride: 106 mmol/L (ref 98–111)
Creatinine, Ser: 0.8 mg/dL (ref 0.44–1.00)
Glucose, Bld: 132 mg/dL — ABNORMAL HIGH (ref 70–99)
HCT: 43 % (ref 36.0–46.0)
Hemoglobin: 14.6 g/dL (ref 12.0–15.0)
Potassium: 4.1 mmol/L (ref 3.5–5.1)
Sodium: 135 mmol/L (ref 135–145)
TCO2: 24 mmol/L (ref 22–32)

## 2021-08-06 LAB — APTT: aPTT: 28 seconds (ref 24–36)

## 2021-08-06 LAB — LACTIC ACID, PLASMA: Lactic Acid, Venous: 2 mmol/L (ref 0.5–1.9)

## 2021-08-06 LAB — RESP PANEL BY RT-PCR (FLU A&B, COVID) ARPGX2
Influenza A by PCR: NEGATIVE
Influenza B by PCR: NEGATIVE
SARS Coronavirus 2 by RT PCR: NEGATIVE

## 2021-08-06 LAB — PROTIME-INR
INR: 1 (ref 0.8–1.2)
Prothrombin Time: 13.4 seconds (ref 11.4–15.2)

## 2021-08-06 MED ORDER — LACTATED RINGERS IV SOLN: INTRAVENOUS | Status: AC

## 2021-08-06 MED ORDER — LACTATED RINGERS IV BOLUS (SEPSIS)
500.0000 mL | Freq: Once | INTRAVENOUS | Status: AC
  Administered 2021-08-06: 23:00:00 500 mL via INTRAVENOUS

## 2021-08-06 MED ORDER — METRONIDAZOLE 500 MG/100ML IV SOLN
500.0000 mg | Freq: Once | INTRAVENOUS | Status: AC
  Administered 2021-08-06: 23:00:00 500 mg via INTRAVENOUS
  Filled 2021-08-06: qty 100

## 2021-08-06 MED ORDER — SODIUM CHLORIDE 0.9 % IV SOLN
2.0000 g | Freq: Once | INTRAVENOUS | Status: AC
  Administered 2021-08-06: 23:00:00 2 g via INTRAVENOUS
  Filled 2021-08-06: qty 2

## 2021-08-06 MED ORDER — ACETAMINOPHEN 650 MG RE SUPP
650.0000 mg | Freq: Once | RECTAL | Status: AC
  Administered 2021-08-06: 650 mg via RECTAL
  Filled 2021-08-06 (×2): qty 1

## 2021-08-06 MED ORDER — VANCOMYCIN HCL 1500 MG/300ML IV SOLN
1500.0000 mg | Freq: Once | INTRAVENOUS | Status: AC
  Administered 2021-08-06: 1500 mg via INTRAVENOUS
  Filled 2021-08-06: qty 300

## 2021-08-06 NOTE — Progress Notes (Signed)
Pt being followed by [facility] for Sepsis protocol. 

## 2021-08-06 NOTE — ED Provider Notes (Signed)
[facility] EMERGENCY DEPARTMENT Provider Note   CSN: [id number] Arrival date & time: 08/06/21  2213     History Chief Complaint  Patient presents with   Shortness of Breath    Katelyn Lamb is a 75 y.o. 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 Per EMS received call out for dyspnea and abdominal pain.   Per chart review patient with recent hospital admission 07/14/21-08/04/21 for UTI placed on meropenem- Dced after seen by ID- felt likely colonization, had epistaxis uncontrolled by rhino rocket which required S/p emobolization of the left sphenopalatine artery per IR, required intubation for this but was able to be extubated and weaned of oxygen per discharge note, holding Xarelto at this time. Tonight nursing care facility noted patient was short of breath, per EMS hypoxic into the 80s on supplement O2 via [place] therefore transitioned to facemask with 6L. Patient reports she feels short of breath and she is having abdominal pain, notes multiple loose Bms today. No alleviating/aggravating factors. Denies chest pain, melena, hematochezia, emesis, fever or chills.   HPI     Past Medical History:  Diagnosis Date   Acute deep vein thrombosis (DVT) of popliteal vein of left lower extremity (HCC)    Anxiety    Back pain    Chronic kidney disease    Encephalomyelitis    Gait abnormality 11/14/2016   Memory difficulty 03/02/2019   Myelitis due to herpes simplex [facility])    Neurogenic bladder     Patient Active Problem List   Diagnosis Date Noted   Platelet inhibition due to Plavix    Epistaxis s/p embolization 07-18-2021 07/17/2021   Asymptomatic bacteriuria    History of ESBL E. coli infection 07/14/2021   History of ESBL Klebsiella pneumoniae infection  07/14/2021   Generalized weakness 03/15/2021   Acute cystitis with hematuria 03/15/2021   Encephalopathy 03/15/2021   Recurrent falls 03/15/2021   Right pulmonary embolus ([place]) 02/12/2020   Lower extremity weakness 02/02/2020   Macrocytic anemia 02/02/2020   Spinal stenosis 02/02/2020   History of recurrent UTI (urinary tract infection)    Bacterial infection due to Klebsiella pneumoniae    Laceration of left hand 12/09/2019   DNR (do not resuscitate) 12/09/2019   Pelvic fracture ([place]) 12/06/2019   AKI (acute kidney injury) ([place]) 12/06/2019   Urinary tract infection associated with catheterization of urinary tract, initial encounter ([place]) 07/23/2019   Hematuria 07/23/2019   Acute respiratory failure with hypoxia ([place]) 07/23/2019   Hypokalemia 07/23/2019   Acute on chronic anemia 07/23/2019   Fever 07/23/2019   Leukocytosis 07/23/2019   Sepsis ([place]) 07/23/2019   Generalized anxiety disorder 06/17/2019   Menopausal sweats 06/17/2019   Memory difficulty 03/02/2019   Degenerative spondylolisthesis 09/02/2018   Delirium 03/26/2018   Degeneration of lumbar intervertebral disc 11/08/2017   Lumbar radiculopathy 11/06/2017   Chronic neck pain 09/12/2017   Chronic low back pain 09/06/2017   Chronic pain syndrome 09/06/2017   Dilated pancreatic duct 01/24/2017   DVT, lower extremity, distal, chronic ([place]) 01/22/2017   Elevated serum GGT level 01/22/2017   Medication monitoring encounter 01/08/2017   Neurologic gait dysfunction 11/14/2016   Hypotension due to drugs    Urinary retention    Anxiety about health    Reactive depression  Ataxia    Abdominal spasms    Constipation due to pain medication    Acute lower UTI    Dysuria    Acute deep vein thrombosis (DVT) of popliteal vein of left lower extremity (HCC)    Incomplete paraplegia (HCC)    Acute blood loss anemia    Neurogenic bladder    Neuropathic pain    Muscle spasm    Gastroesophageal reflux disease    Slow transit  constipation    Thrombocytopenia (HCC) 10/03/2016   Abnormal MRI, spinal cord    Encephalomyelitis    Numbness    Intractable back pain 09/20/2016   Numbness of left lower extremity 09/20/2016   Hyponatremia 09/20/2016   Herpes zoster without complication 25/01/3975   Spondylosis of cervical region without myelopathy or radiculopathy 06/16/2015    Past Surgical History:  Procedure Laterality Date   ABDOMINAL HYSTERECTOMY     BLADDER REPAIR     CESAREAN SECTION     IR ANGIO EXTERNAL CAROTID SEL EXT CAROTID BILAT MOD SED  07/18/2021   IR ANGIO INTRA EXTRACRAN SEL INTERNAL CAROTID BILAT MOD SED  07/18/2021   IR KYPHO LUMBAR INC FX REDUCE BONE BX UNI/BIL CANNULATION INC/IMAGING  04/11/2018   IR NEURO EACH ADD'L AFTER BASIC UNI LEFT (MS)  07/18/2021   IR NEURO EACH ADD'L AFTER BASIC UNI RIGHT (MS)  07/18/2021   IR TRANSCATH/EMBOLIZ  07/18/2021   IR US GUIDE VASC ACCESS RIGHT  07/18/2021   RADIOLOGY WITH ANESTHESIA N/A 07/18/2021   Procedure: IR WITH ANESTHESIA;  Surgeon: Pedro Earls, MD;  Location: [place];  Service: Radiology;  Laterality: N/A;   TUBAL LIGATION       OB History   No obstetric history on file.     Family History  Problem Relation Age of Onset   Hypertension Mother     Social History   Tobacco Use   Smoking status: Former   Smokeless tobacco: Never   Tobacco comments:    40 years ago   Vaping Use   Vaping Use: Never used  Substance Use Topics   Alcohol use: No   Drug use: Never    Home Medications Prior to Admission medications   Medication Sig Start Date End Date Taking? Authorizing Provider  acyclovir (ZOVIRAX) 400 MG tablet TAKE 1 TABLET BY MOUTH TWICE A DAY Patient taking differently: Take 400 mg by mouth 2 (two) times daily. 03/27/21   [name], MD  Ascorbic Acid (VITAMIN C) 500 MG CHEW Chew 500 mg by mouth daily.    [provider]  Calcium Carb-Cholecalciferol (CALCIUM 600 + D PO) Take 1 tablet by mouth in the  morning and at bedtime.    [provider]  Cyanocobalamin (VITAMIN B-12 PO) Take 1 tablet by mouth daily.    [provider]  diazepam (VALIUM) 5 MG tablet Take 1 tablet (5 mg total) by mouth in the morning and at bedtime. For tremors 08/01/21   Oswald Hillock, MD  diclofenac Sodium (VOLTAREN) 1 % GEL Apply 1 application topically 2 (two) times daily as needed (nerve pain).    [provider]  docusate sodium (COLACE) 100 MG capsule Take 200 mg by mouth daily as needed (constipation).    [provider]  DULoxetine (CYMBALTA) 30 MG capsule Take 30 mg by mouth in the morning. 03/17/21   [provider]  DULoxetine (CYMBALTA) 60 MG capsule Take 1 capsule (60 mg total) by mouth at bedtime. 08/01/21  Oswald Hillock, MD  Emollient (CERAVE) CREA Apply 1 application topically daily as needed (bruise spots).    [provider]  ferrous sulfate 325 (65 FE) MG tablet Take 325 mg by mouth every morning. 03/10/21   [provider]  HYDROcodone-acetaminophen (NORCO) 10-325 MG tablet Take 1 tablet by mouth every 6 (six) hours as needed. 08/01/21   Oswald Hillock, MD  levETIRAcetam (KEPPRA) 250 MG tablet TAKE 1 TABLET BY MOUTH TWICE A DAY Patient taking differently: Take 250 mg by mouth 2 (two) times daily. 05/09/21   [name], MD  mirabegron ER (MYRBETRIQ) 50 MG TB24 tablet Take 50 mg by mouth at bedtime.    [provider]  Multiple Vitamins-Minerals (HAIR/SKIN/NAILS/BIOTIN) TABS Take 1 tablet by mouth every morning.    [provider]  omega-3 acid ethyl esters (LOVAZA) 1 g capsule Take 1 g by mouth every morning.    [provider]  pantoprazole (PROTONIX) 40 MG tablet Take 1 tablet (40 mg total) by mouth daily. Patient taking differently: Take 40 mg by mouth at bedtime. 10/26/16   Angiulli, Lavon Paganini, PA-C  polyethylene glycol (MIRALAX / GLYCOLAX) packet Take 17 g by mouth daily. Patient taking differently: Take 17 g  by mouth daily as needed (constipation). 10/26/16   Angiulli, Lavon Paganini, PA-C  predniSONE (DELTASONE) 10 MG tablet Prednisone 40 mg po daily x 1 day then Prednisone 30 mg po daily x 1 day then Prednisone 20 mg po daily x 1 day then Prednisone 10 mg daily x 1 day then Prednisone 5 mg po daily as your usual dose 08/01/21   Ira[place], Marge Duncans, MD  predniSONE (DELTASONE) 5 MG tablet TAKE 1 TABLET BY MOUTH EVERY DAY WITH BREAKFAST Patient taking differently: Take 5 mg by mouth daily with breakfast. 03/27/21   [name], MD  pregabalin (LYRICA) 50 MG capsule Take 2 capsules (100 mg total) by mouth at bedtime. 02/09/21   [name], MD  QUEtiapine (SEROQUEL) 100 MG tablet Take 100 mg by mouth at bedtime. 01/25/21   [provider]  sodium chloride (OCEAN) 0.65 % SOLN nasal spray Place 1 spray into both nostrils as needed for congestion. 08/01/21   Oswald Hillock, MD    Allergies    Demerol [meperidine], Percocet [oxycodone-acetaminophen], Amoxicillin-pot clavulanate, and Penicillins  Review of Systems   Review of Systems  Constitutional:  Negative for fever.  Respiratory:  Positive for shortness of breath.   Cardiovascular:  Negative for chest pain.  Gastrointestinal:  Positive for abdominal pain and diarrhea. Negative for blood in stool, nausea and vomiting.  Neurological:  Negative for syncope.  All other systems reviewed and are negative.  Physical Exam Updated Vital Signs BP (!) 127/93   Pulse (!) 126   Temp (!) 101.4 F (38.6 C) (Rectal)   Resp (!) 26   SpO2 93%   Physical Exam Vitals and nursing note reviewed.  Constitutional:      Appearance: She is ill-appearing. She is not toxic-appearing.  HENT:     Head: Normocephalic and atraumatic.  Eyes:     Pupils: Pupils are equal, round, and reactive to light.  Cardiovascular:     Rate and Rhythm: Regular rhythm. Tachycardia present.  Pulmonary:     Effort: Tachypnea present.     Breath sounds: No wheezing or rales.   Abdominal:     General: There is distension.     Tenderness: There is generalized abdominal tenderness. There is no guarding  or rebound.  Genitourinary:    Comments: Suprapubic catheter in place.  Musculoskeletal:     Cervical back: Neck supple. No rigidity.  Skin:    General: Skin is warm and dry.  Neurological:     Mental Status: She is alert.     Comments: Alert. Clear speech.   Psychiatric:        Mood and Affect: Mood normal.        Behavior: Behavior normal.    ED Results / Procedures / Treatments   Labs (all labs ordered are listed, but only abnormal results are displayed) Labs Reviewed  LACTIC ACID, PLASMA - Abnormal; Notable for the following components:      Result Value   Lactic Acid, Venous 2.0 (*)    All other components within normal limits  COMPREHENSIVE METABOLIC PANEL - Abnormal; Notable for the following components:   CO2 20 (*)    Glucose, Bld 136 (*)    BUN 27 (*)    All other components within normal limits  CBC WITH DIFFERENTIAL/PLATELET - Abnormal; Notable for the following components:   WBC 24.4 (*)    RDW 16.8 (*)    Neutro Abs 18.2 (*)    Lymphs Abs 4.1 (*)    Monocytes Absolute 1.3 (*)    Abs Immature Granulocytes 0.47 (*)    All other components within normal limits  URINALYSIS, ROUTINE W REFLEX MICROSCOPIC - Abnormal; Notable for the following components:   APPearance HAZY (*)    Leukocytes,Ua SMALL (*)    Bacteria, UA FEW (*)    All other components within normal limits  URINALYSIS, ROUTINE W REFLEX MICROSCOPIC - Abnormal; Notable for the following components:   APPearance CLOUDY (*)    Protein, ur 30 (*)    Leukocytes,Ua MODERATE (*)    WBC, UA >50 (*)    Bacteria, UA FEW (*)    All other components within normal limits  I-STAT CHEM 8, ED - Abnormal; Notable for the following components:   BUN 33 (*)    Glucose, Bld 132 (*)    Calcium, Ion 1.04 (*)    All other components within normal limits  TROPONIN I (HIGH SENSITIVITY) -  Abnormal; Notable for the following components:   Troponin I (High Sensitivity) 39 (*)    All other components within normal limits  TROPONIN I (HIGH SENSITIVITY) - Abnormal; Notable for the following components:   Troponin I (High Sensitivity) 48 (*)    All other components within normal limits  RESP PANEL BY RT-PCR (FLU A&B, COVID) ARPGX2  CULTURE, BLOOD (ROUTINE X 2)  CULTURE, BLOOD (ROUTINE X 2)  URINE CULTURE  PROTIME-INR  APTT  LACTIC ACID, PLASMA    EKG EKG Interpretation  Date/Time:  Sunday August 06 2021 22:15:54 EST Ventricular Rate:  124 PR Interval:  129 QRS Duration: 99 QT Interval:  306 QTC Calculation: 440 R Axis:   -57 Text Interpretation: Sinus tachycardia Atrial premature complex Left axis deviation Low voltage, precordial leads Since last tracing rate faster Confirmed by Wandra Arthurs [phone]) on 08/06/2021 10:24:54 PM  Radiology CT Angio Chest PE W and/or Wo Contrast  Result Date: 08/07/2021 CLINICAL DATA:  High probability of pulmonary thromboemboli. Abdominal distention also. EXAM: CT ANGIOGRAPHY CHEST CT ABDOMEN AND PELVIS WITH CONTRAST TECHNIQUE: Multidetector CT imaging of the chest was performed using the standard protocol during bolus administration of intravenous contrast. Multiplanar CT image reconstructions and MIPs were obtained to evaluate the vascular anatomy. Multidetector CT imaging of the  abdomen and pelvis was performed using the standard protocol during bolus administration of intravenous contrast. CONTRAST:  133mL OMNIPAQUE IOHEXOL 350 MG/ML SOLN COMPARISON:  None. CTA chest 02/12/2020 and 07/23/2019, portable chest today and 07/27/2021, CT abdomen and pelvis without contrast 04/14/2021, CT abdomen pelvis with contrast 11/22/2015. FINDINGS: CTA CHEST FINDINGS Cardiovascular: Prominent pulmonary trunk 3.1 cm indicating arterial hypertension, with slightly prominent right heart chambers and IVC reflux compatible with right heart strain. There are  bilateral nonocclusive emboli in the distal main pulmonary arteries with near-occlusive thrombus in the interlobar artery and lateral and medial segmental right middle lobe arteries. There is occluding thrombus in the right upper lobe apical segmental and subsegmental arteries, nonocclusive thrombus in the right upper lobe anterior segmental artery. There are multiple segmental and subsegmental right lower lobe arteries with nonocclusive thrombus, and additional nonoccluding thrombus in the left upper lobe anterior segmental artery and the left lower lobe main and 2 segmental arteries to the posterior and medial left base. There is moderate overall embolic burden which appears acute. There is mild cardiomegaly with slight right chamber predominance, trace calcification LAD coronary artery, small amount of scattered aortic calcification and mild aortic ectasia and tortuosity. There is no aortic aneurysm or dissection, with normal great vessel branching and opacification. There is no venous dilatation no pericardial effusion. Mediastinum/Nodes: No enlarged mediastinal, hilar, or axillary lymph nodes. Thyroid gland, trachea, and esophagus demonstrate no significant findings. Lungs/Pleura: No pleural effusion or pneumothorax. Lung bases show posterior atelectasis. There is interstitial haziness in the medial base of the right upper lobe which could be due to low lung volumes and micro atelectasis or pneumonitis. There are small caliber central airways and trachea which could indicate bronchospasm or be due to expiration. Remaining lungs are clear. Musculoskeletal: Osteopenia and degenerative change with mild dextroscoliosis thoracic spine. No concerning regional skeletal lesion. Review of the MIP images confirms the above findings. CT ABDOMEN and PELVIS FINDINGS Factors affecting image quality in the abdomen: Respiratory motion artifact. Hepatobiliary: There is no mass enhancement, but there is portal venous gas in the  left hepatic lobe near the dome and along the inter segmental division but not in the main portal vein. Etiology is unclear. Gallbladder and bile ducts unremarkable. Pancreas: Mildly prominent pancreatic duct which has been seen on prior studies. No mass enhancement or interval change. Spleen: Normal Adrenals/Urinary Tract: Unremarkable adrenal glands and renal cortex. No calculus or hydronephrosis. The bladder is contracted around a suprapubic Foley catheter and not well seen. Stomach/Bowel: The stomach is moderately distended with air and fluid. There is dilatation in the descending duodenum up to 3.2 cm to the right of the aorta, with duodenum and jejunum decompressed left of the aorta. Small bowel is otherwise unremarkable with a normal appendix. There is air and fluid distention of the ascending and transverse colon with the cecum up to 10.3 cm in diameter but no wall thickening. No other colonic dilatation is seen. There is fluid throughout the colon. No obstructing mass is visible. Vascular/Lymphatic: Aortic atherosclerosis. No enlarged abdominal or pelvic lymph nodes. Reproductive: The uterus is absent. No adnexal lesion is seen. Multiple pelvic phleboliths. Other: There is no free air, hemorrhage, fluid or bowel pneumatosis. Musculoskeletal: Osteopenia and compression fractures again noted at T12, L2, L3, L4 and L5, unchanged compared with lumbar spine MRI 07/15/2021 with kyphoplasty cement again seen at L2 and 5. There are degenerative changes of the lumbar spine, old healed left inferior pubic ramus fracture. IMPRESSION: 1. Bilateral lobar, segmental  and subsegmental pulmonary arterial emboli, some of which are occlusive, with overall moderate clot burden and mild features of right heart strain. 2. Aortic atherosclerosis. 3. Cardiomegaly with scattered calcification LAD coronary artery. 4. Interstitial haziness medially in the right upper lobe which could relate to low lung volumes or pneumonitis. 5.  Portal venous gas in the liver, unknown etiology. This may be seen with necrotic bowel but there are no thickened segments or segments showing pneumatosis. There is a distended stomach and proximal duodenum with air and fluid which could be due to an ileus or intermittent SMA compression syndrome. There is also mild dilatation and fluid filling in the ascending colon without a visible obstructing mass or small bowel dilatation in the remainder of the abdomen and pelvis. Adynamic ileus may be present with the portal venous gas related to dilatation in the stomach and/or ascending colon. Fluid is seen throughout the colon without wall thickening or inflammation. Close clinical follow-up and laboratory correlation recommended. 6. Osteopenia with lumbar and T12 compression fractures similar in appearance to an MRI of 07/15/2021. 7. Results phoned to Dr. Darl Householder at 1:01 a.m., 08/07/2021. Electronically Signed   By: Telford Nab M.D.   On: 08/07/2021 01:09   CT ABDOMEN PELVIS W CONTRAST  Result Date: 08/07/2021 CLINICAL DATA:  High probability of pulmonary thromboemboli. Abdominal distention also. EXAM: CT ANGIOGRAPHY CHEST CT ABDOMEN AND PELVIS WITH CONTRAST TECHNIQUE: Multidetector CT imaging of the chest was performed using the standard protocol during bolus administration of intravenous contrast. Multiplanar CT image reconstructions and MIPs were obtained to evaluate the vascular anatomy. Multidetector CT imaging of the abdomen and pelvis was performed using the standard protocol during bolus administration of intravenous contrast. CONTRAST:  166mL OMNIPAQUE IOHEXOL 350 MG/ML SOLN COMPARISON:  None. CTA chest 02/12/2020 and 07/23/2019, portable chest today and 07/27/2021, CT abdomen and pelvis without contrast 04/14/2021, CT abdomen pelvis with contrast 11/22/2015. FINDINGS: CTA CHEST FINDINGS Cardiovascular: Prominent pulmonary trunk 3.1 cm indicating arterial hypertension, with slightly prominent right heart  chambers and IVC reflux compatible with right heart strain. There are bilateral nonocclusive emboli in the distal main pulmonary arteries with near-occlusive thrombus in the interlobar artery and lateral and medial segmental right middle lobe arteries. There is occluding thrombus in the right upper lobe apical segmental and subsegmental arteries, nonocclusive thrombus in the right upper lobe anterior segmental artery. There are multiple segmental and subsegmental right lower lobe arteries with nonocclusive thrombus, and additional nonoccluding thrombus in the left upper lobe anterior segmental artery and the left lower lobe main and 2 segmental arteries to the posterior and medial left base. There is moderate overall embolic burden which appears acute. There is mild cardiomegaly with slight right chamber predominance, trace calcification LAD coronary artery, small amount of scattered aortic calcification and mild aortic ectasia and tortuosity. There is no aortic aneurysm or dissection, with normal great vessel branching and opacification. There is no venous dilatation no pericardial effusion. Mediastinum/Nodes: No enlarged mediastinal, hilar, or axillary lymph nodes. Thyroid gland, trachea, and esophagus demonstrate no significant findings. Lungs/Pleura: No pleural effusion or pneumothorax. Lung bases show posterior atelectasis. There is interstitial haziness in the medial base of the right upper lobe which could be due to low lung volumes and micro atelectasis or pneumonitis. There are small caliber central airways and trachea which could indicate bronchospasm or be due to expiration. Remaining lungs are clear. Musculoskeletal: Osteopenia and degenerative change with mild dextroscoliosis thoracic spine. No concerning regional skeletal lesion. Review of the MIP images  confirms the above findings. CT ABDOMEN and PELVIS FINDINGS Factors affecting image quality in the abdomen: Respiratory motion artifact.  Hepatobiliary: There is no mass enhancement, but there is portal venous gas in the left hepatic lobe near the dome and along the inter segmental division but not in the main portal vein. Etiology is unclear. Gallbladder and bile ducts unremarkable. Pancreas: Mildly prominent pancreatic duct which has been seen on prior studies. No mass enhancement or interval change. Spleen: Normal Adrenals/Urinary Tract: Unremarkable adrenal glands and renal cortex. No calculus or hydronephrosis. The bladder is contracted around a suprapubic Foley catheter and not well seen. Stomach/Bowel: The stomach is moderately distended with air and fluid. There is dilatation in the descending duodenum up to 3.2 cm to the right of the aorta, with duodenum and jejunum decompressed left of the aorta. Small bowel is otherwise unremarkable with a normal appendix. There is air and fluid distention of the ascending and transverse colon with the cecum up to 10.3 cm in diameter but no wall thickening. No other colonic dilatation is seen. There is fluid throughout the colon. No obstructing mass is visible. Vascular/Lymphatic: Aortic atherosclerosis. No enlarged abdominal or pelvic lymph nodes. Reproductive: The uterus is absent. No adnexal lesion is seen. Multiple pelvic phleboliths. Other: There is no free air, hemorrhage, fluid or bowel pneumatosis. Musculoskeletal: Osteopenia and compression fractures again noted at T12, L2, L3, L4 and L5, unchanged compared with lumbar spine MRI 07/15/2021 with kyphoplasty cement again seen at L2 and 5. There are degenerative changes of the lumbar spine, old healed left inferior pubic ramus fracture. IMPRESSION: 1. Bilateral lobar, segmental and subsegmental pulmonary arterial emboli, some of which are occlusive, with overall moderate clot burden and mild features of right heart strain. 2. Aortic atherosclerosis. 3. Cardiomegaly with scattered calcification LAD coronary artery. 4. Interstitial haziness medially in  the right upper lobe which could relate to low lung volumes or pneumonitis. 5. Portal venous gas in the liver, unknown etiology. This may be seen with necrotic bowel but there are no thickened segments or segments showing pneumatosis. There is a distended stomach and proximal duodenum with air and fluid which could be due to an ileus or intermittent SMA compression syndrome. There is also mild dilatation and fluid filling in the ascending colon without a visible obstructing mass or small bowel dilatation in the remainder of the abdomen and pelvis. Adynamic ileus may be present with the portal venous gas related to dilatation in the stomach and/or ascending colon. Fluid is seen throughout the colon without wall thickening or inflammation. Close clinical follow-up and laboratory correlation recommended. 6. Osteopenia with lumbar and T12 compression fractures similar in appearance to an MRI of 07/15/2021. 7. Results phoned to Dr. Darl Householder at 1:01 a.m., 08/07/2021. Electronically Signed   By: Telford Nab M.D.   On: 08/07/2021 01:09   DG Chest Port 1 View  Result Date: 08/06/2021 CLINICAL DATA:  Sepsis workup. EXAM: PORTABLE CHEST 1 VIEW COMPARISON:  Portable chest 07/27/2021 FINDINGS: The patient is rotated to the left and the lungs are expiratory with obscured lung bases. The visualized lungs are generally clear. No pleural effusion is seen. There is mild cardiomegaly, mild aortic atherosclerosis and tortuosity. There is prominence of the central vessels which could relate to low lung volumes or mild perihilar vascular congestion without evidence of acute edema. Osteopenia mild thoracic dextroscoliosis. There is moderate aeration of the flexures of the colon incidentally noted. IMPRESSION: Expiratory with obscured lung bases. The visualized hypoinflated lungs are generally  clear. There could be mild perihilar vascular congestion or exaggerated central vasculature due to expiration. Electronically Signed   By: Telford Nab M.D.   On: 08/06/2021 23:15    Procedures .Critical Care Performed by: Amaryllis Dyke, PA-C Authorized by: Amaryllis Dyke, PA-C  CRITICAL CARE Performed by: Kennith Maes   Total critical care time: 45 minutes  Critical care time was exclusive of separately billable procedures and treating other patients.  Critical care was necessary to treat or prevent imminent or life-threatening deterioration.  Critical care was time spent personally by me on the following activities: development of treatment plan with patient and/or surrogate as well as nursing, discussions with consultants, evaluation of patient's response to treatment, examination of patient, obtaining history from patient or surrogate, ordering and performing treatments and interventions, ordering and review of laboratory studies, ordering and review of radiographic studies, pulse oximetry and re-evaluation of patient's condition.    Medications Ordered in ED Medications  lactated ringers infusion ( Intravenous New Bag/Given 08/06/21 2257)  vancomycin (VANCOREADY) IVPB 1500 mg/300 mL (has no administration in time range)  aztreonam (AZACTAM) 1 g in sodium chloride 0.9 % 100 mL IVPB (has no administration in time range)  heparin ADULT infusion 100 units/mL (25000 units/266mL) (900 Units/hr Intravenous New Bag/Given 08/07/21 0215)  aztreonam (AZACTAM) 2 g in sodium chloride 0.9 % 100 mL IVPB (0 g Intravenous Stopped 08/06/21 2329)  metroNIDAZOLE (FLAGYL) IVPB 500 mg (0 mg Intravenous Stopped 08/07/21 0029)  vancomycin (VANCOREADY) IVPB 1500 mg/300 mL (0 mg Intravenous Stopped 08/07/21 0143)  lactated ringers bolus 500 mL (0 mLs Intravenous Stopped 08/07/21 0028)  acetaminophen (TYLENOL) suppository 650 mg (650 mg Rectal Given 08/06/21 2341)  iohexol (OMNIPAQUE) 350 MG/ML injection 100 mL (100 mLs Intravenous Contrast Given 08/07/21 0003)    ED Course  I have reviewed the triage vital signs and the nursing  notes.  Pertinent labs & imaging results that were available during my care of the patient were reviewed by me and considered in my medical decision making (see chart for details).    MDM Rules/Calculators/A&P                           Patient presents to the ED for evaluation of dyspnea & abdominal pain.  Requiring 3L O2 via simple facemask. No obvious adventitious breath sounds. Abdomen is distended and generally tender. Febrile, tachycardic, and tachypneic. Code sepsis w/ broad spectrum abx, plan for CTA chest to evaluation for PE and CT A/P to further evaluate intra-abdominal pathology at this time.   Additional history obtained:  Additional history obtained from chart review & nursing note review.  Procedure 07/18/21 for embolization in relation to her epistaxis.   EKG: Sinus tachycardia Atrial premature complex Left axis deviation Low voltage, precordial leads Since last tracing rate faster   Lab Tests:  I Ordered, reviewed, and interpreted labs, which included:  CBC: Significant leukocytosis @ 24.4 CMP: BUN elevation, otherwise unremarkable.  APTT/PT/INR: WNL Covid/flu: negative Lactic: Mild elevation.  UA: leukocytes, few bacteria, yeast, sent for culture Troponin: elevated, 39--48  Imaging Studies ordered:  I ordered imaging studies which included CXR and subsequent CTA chest & CT A/P, I independently reviewed, formal radiology impression shows:  CXR: Expiratory with obscured lung bases. The visualized hypoinflated lungs are generally clear. There could be mild perihilar vascular congestion or exaggerated central vasculature due to expiration. CTA chest & CT A/P: 1. Bilateral lobar, segmental and subsegmental pulmonary  arterial emboli, some of which are occlusive, with overall moderate clot burden and mild features of right heart strain. 2. Aortic atherosclerosis. 3. Cardiomegaly with scattered calcification LAD coronary artery. 4. Interstitial haziness medially in the right  upper lobe which could relate to low lung volumes or pneumonitis. 5. Portal venous gas in the liver, unknown etiology. This may be seen with necrotic bowel but there are no thickened segments or segments showing pneumatosis. There is a distended stomach and proximal duodenum with air and fluid which could be due to an ileus or intermittent SMA compression syndrome. There is also mild dilatation and fluid filling in the ascending colon without a visible obstructing mass or small bowel dilatation in the remainder of the abdomen and pelvis. Adynamic ileus may be present with the portal venous gas related to dilatation in the stomach and/or ascending colon. Fluid is seen throughout the colon without wall thickening or inflammation. Close clinical follow-up and laboratory correlation recommended. 6. Osteopenia with lumbar and T12 compression fractures similar in appearance to an MRI of 07/15/2021.   ED Course:  Patient with acute bilateral pulmonary embolism with mild right heart strain, tachycardic and requiring supplemental O2, not hypotensive, likely in the setting of held anticoagulation with epistaxis requiring embolization, no active bleeding, this procedure was > 2 weeks prior, will start patient on heparin. Several changes within the abdomen on CT, possible ileus, no definitive bowel obstruction and patient is not actively vomiting therefore no NG tube, will continue to monitor, will discuss w/ medicine for admission. Patient & her daughter updated on results & plan of care and are in agreement.   02:50: CONSULT: Discussed with hospitalist Dr. Velia Meyer- accepts admission.   This is a shared visit with supervising physician Dr. Darl Householder who has independently evaluated patient as well as Dr. Christy Gentles @ shift change- they have provided guidance in evaluation/management/disposition, in agreement with care   Portions of this note were generated with Dragon dictation software. Dictation errors may occur despite  best attempts at proofreading.  Final Clinical Impression(s) / ED Diagnoses Final diagnoses:  Bilateral pulmonary embolism (HCC)  Febrile illness  Abdominal distension    Rx / DC Orders ED Discharge Orders     None        Amaryllis Dyke, PA-C 08/07/21 7841    Drenda Freeze, MD 08/07/21 [phone]

## 2021-08-06 NOTE — ED Triage Notes (Signed)
Pt BIB [facility] from [facility] c/o SOB. D/c recently from hospital on 3L , tonight sats 88% on 3L. EMS placed 6L simple mask on pt with 98% sats. Distended abd also noted by family & EMS. A&Ox4.

## 2021-08-07 ENCOUNTER — Inpatient Hospital Stay (HOSPITAL_COMMUNITY): Payer: PPO

## 2021-08-07 ENCOUNTER — Emergency Department (HOSPITAL_COMMUNITY): Payer: PPO

## 2021-08-07 ENCOUNTER — Encounter (HOSPITAL_COMMUNITY): Payer: Self-pay | Admitting: Internal Medicine

## 2021-08-07 DIAGNOSIS — Y95 Nosocomial condition: Secondary | ICD-10-CM | POA: Diagnosis not present

## 2021-08-07 DIAGNOSIS — Z8249 Family history of ischemic heart disease and other diseases of the circulatory system: Secondary | ICD-10-CM | POA: Diagnosis not present

## 2021-08-07 DIAGNOSIS — Z20822 Contact with and (suspected) exposure to covid-19: Secondary | ICD-10-CM | POA: Diagnosis not present

## 2021-08-07 DIAGNOSIS — G894 Chronic pain syndrome: Secondary | ICD-10-CM | POA: Diagnosis not present

## 2021-08-07 DIAGNOSIS — R8271 Bacteriuria: Secondary | ICD-10-CM | POA: Diagnosis not present

## 2021-08-07 DIAGNOSIS — I2609 Other pulmonary embolism with acute cor pulmonale: Secondary | ICD-10-CM | POA: Diagnosis not present

## 2021-08-07 DIAGNOSIS — R778 Other specified abnormalities of plasma proteins: Secondary | ICD-10-CM

## 2021-08-07 DIAGNOSIS — Z888 Allergy status to other drugs, medicaments and biological substances status: Secondary | ICD-10-CM | POA: Diagnosis not present

## 2021-08-07 DIAGNOSIS — N319 Neuromuscular dysfunction of bladder, unspecified: Secondary | ICD-10-CM | POA: Diagnosis not present

## 2021-08-07 DIAGNOSIS — J9601 Acute respiratory failure with hypoxia: Secondary | ICD-10-CM | POA: Diagnosis not present

## 2021-08-07 DIAGNOSIS — Z7952 Long term (current) use of systemic steroids: Secondary | ICD-10-CM | POA: Diagnosis not present

## 2021-08-07 DIAGNOSIS — I517 Cardiomegaly: Secondary | ICD-10-CM | POA: Diagnosis not present

## 2021-08-07 DIAGNOSIS — K3189 Other diseases of stomach and duodenum: Secondary | ICD-10-CM | POA: Diagnosis not present

## 2021-08-07 DIAGNOSIS — E872 Acidosis, unspecified: Secondary | ICD-10-CM | POA: Diagnosis not present

## 2021-08-07 DIAGNOSIS — M170 Bilateral primary osteoarthritis of knee: Secondary | ICD-10-CM | POA: Diagnosis not present

## 2021-08-07 DIAGNOSIS — I2699 Other pulmonary embolism without acute cor pulmonale: Secondary | ICD-10-CM

## 2021-08-07 DIAGNOSIS — I700 Atherosclerosis of aorta: Secondary | ICD-10-CM | POA: Diagnosis not present

## 2021-08-07 DIAGNOSIS — G990 Autonomic neuropathy in diseases classified elsewhere: Secondary | ICD-10-CM | POA: Diagnosis not present

## 2021-08-07 DIAGNOSIS — I82441 Acute embolism and thrombosis of right tibial vein: Secondary | ICD-10-CM | POA: Diagnosis not present

## 2021-08-07 DIAGNOSIS — Z1159 Encounter for screening for other viral diseases: Secondary | ICD-10-CM | POA: Diagnosis not present

## 2021-08-07 DIAGNOSIS — F331 Major depressive disorder, recurrent, moderate: Secondary | ICD-10-CM | POA: Diagnosis not present

## 2021-08-07 DIAGNOSIS — D539 Nutritional anemia, unspecified: Secondary | ICD-10-CM | POA: Diagnosis not present

## 2021-08-07 DIAGNOSIS — R457 State of emotional shock and stress, unspecified: Secondary | ICD-10-CM | POA: Diagnosis not present

## 2021-08-07 DIAGNOSIS — Z880 Allergy status to penicillin: Secondary | ICD-10-CM | POA: Diagnosis not present

## 2021-08-07 DIAGNOSIS — N189 Chronic kidney disease, unspecified: Secondary | ICD-10-CM | POA: Diagnosis not present

## 2021-08-07 DIAGNOSIS — J189 Pneumonia, unspecified organism: Secondary | ICD-10-CM | POA: Diagnosis not present

## 2021-08-07 DIAGNOSIS — F32A Depression, unspecified: Secondary | ICD-10-CM | POA: Diagnosis not present

## 2021-08-07 DIAGNOSIS — R109 Unspecified abdominal pain: Secondary | ICD-10-CM

## 2021-08-07 DIAGNOSIS — Z9359 Other cystostomy status: Secondary | ICD-10-CM | POA: Diagnosis not present

## 2021-08-07 DIAGNOSIS — R569 Unspecified convulsions: Secondary | ICD-10-CM | POA: Diagnosis not present

## 2021-08-07 DIAGNOSIS — B004 Herpesviral encephalitis: Secondary | ICD-10-CM | POA: Diagnosis not present

## 2021-08-07 DIAGNOSIS — I10 Essential (primary) hypertension: Secondary | ICD-10-CM | POA: Diagnosis not present

## 2021-08-07 DIAGNOSIS — R0602 Shortness of breath: Secondary | ICD-10-CM | POA: Diagnosis not present

## 2021-08-07 DIAGNOSIS — J168 Pneumonia due to other specified infectious organisms: Secondary | ICD-10-CM | POA: Diagnosis not present

## 2021-08-07 DIAGNOSIS — K6389 Other specified diseases of intestine: Secondary | ICD-10-CM | POA: Diagnosis not present

## 2021-08-07 DIAGNOSIS — D72829 Elevated white blood cell count, unspecified: Secondary | ICD-10-CM | POA: Diagnosis not present

## 2021-08-07 DIAGNOSIS — K219 Gastro-esophageal reflux disease without esophagitis: Secondary | ICD-10-CM

## 2021-08-07 DIAGNOSIS — F411 Generalized anxiety disorder: Secondary | ICD-10-CM | POA: Diagnosis not present

## 2021-08-07 DIAGNOSIS — R7303 Prediabetes: Secondary | ICD-10-CM | POA: Diagnosis not present

## 2021-08-07 DIAGNOSIS — M6281 Muscle weakness (generalized): Secondary | ICD-10-CM | POA: Diagnosis not present

## 2021-08-07 DIAGNOSIS — G2581 Restless legs syndrome: Secondary | ICD-10-CM | POA: Diagnosis not present

## 2021-08-07 DIAGNOSIS — G40909 Epilepsy, unspecified, not intractable, without status epilepticus: Secondary | ICD-10-CM

## 2021-08-07 DIAGNOSIS — J9811 Atelectasis: Secondary | ICD-10-CM | POA: Diagnosis not present

## 2021-08-07 DIAGNOSIS — G8222 Paraplegia, incomplete: Secondary | ICD-10-CM | POA: Diagnosis not present

## 2021-08-07 DIAGNOSIS — M4184 Other forms of scoliosis, thoracic region: Secondary | ICD-10-CM | POA: Diagnosis not present

## 2021-08-07 DIAGNOSIS — F5101 Primary insomnia: Secondary | ICD-10-CM | POA: Diagnosis not present

## 2021-08-07 DIAGNOSIS — Z66 Do not resuscitate: Secondary | ICD-10-CM | POA: Diagnosis not present

## 2021-08-07 DIAGNOSIS — G0490 Encephalitis and encephalomyelitis, unspecified: Secondary | ICD-10-CM | POA: Diagnosis not present

## 2021-08-07 DIAGNOSIS — L89616 Pressure-induced deep tissue damage of right heel: Secondary | ICD-10-CM | POA: Diagnosis not present

## 2021-08-07 DIAGNOSIS — K5939 Other megacolon: Secondary | ICD-10-CM | POA: Diagnosis not present

## 2021-08-07 DIAGNOSIS — Z743 Need for continuous supervision: Secondary | ICD-10-CM | POA: Diagnosis not present

## 2021-08-07 DIAGNOSIS — E46 Unspecified protein-calorie malnutrition: Secondary | ICD-10-CM | POA: Diagnosis not present

## 2021-08-07 DIAGNOSIS — F419 Anxiety disorder, unspecified: Secondary | ICD-10-CM | POA: Diagnosis not present

## 2021-08-07 LAB — URINALYSIS, ROUTINE W REFLEX MICROSCOPIC
Bilirubin Urine: NEGATIVE
Glucose, UA: NEGATIVE mg/dL
Hgb urine dipstick: NEGATIVE
Ketones, ur: NEGATIVE mg/dL
Nitrite: NEGATIVE
Protein, ur: 30 mg/dL — AB
Specific Gravity, Urine: 1.021 (ref 1.005–1.030)
WBC, UA: >50 WBC/hpf — ABNORMAL HIGH (ref 0–5)
pH: 5 (ref 5.0–8.0)

## 2021-08-07 LAB — COMPREHENSIVE METABOLIC PANEL
ALT: 40 U/L (ref 0–44)
ALT: 33 U/L (ref 0–44)
AST: 28 U/L (ref 15–41)
AST: 25 U/L (ref 15–41)
Albumin: 3.5 g/dL (ref 3.5–5.0)
Albumin: 2.9 g/dL — ABNORMAL LOW (ref 3.5–5.0)
Alkaline Phosphatase: 109 U/L (ref 38–126)
Alkaline Phosphatase: 90 U/L (ref 38–126)
Anion gap: 14 (ref 5–15)
Anion gap: 9 (ref 5–15)
BUN: 27 mg/dL — ABNORMAL HIGH (ref 8–23)
BUN: 21 mg/dL (ref 8–23)
CO2: 20 mmol/L — ABNORMAL LOW (ref 22–32)
CO2: 23 mmol/L (ref 22–32)
Calcium: 9.5 mg/dL (ref 8.9–10.3)
Calcium: 8.8 mg/dL — ABNORMAL LOW (ref 8.9–10.3)
Chloride: 102 mmol/L (ref 98–111)
Chloride: 100 mmol/L (ref 98–111)
Creatinine, Ser: 0.74 mg/dL (ref 0.44–1.00)
Creatinine, Ser: 0.67 mg/dL (ref 0.44–1.00)
GFR, Estimated: >60 mL/min (ref >60)
GFR, Estimated: >60 mL/min (ref >60)
Glucose, Bld: 136 mg/dL — ABNORMAL HIGH (ref 70–99)
Glucose, Bld: 112 mg/dL — ABNORMAL HIGH (ref 70–99)
Potassium: 4.2 mmol/L (ref 3.5–5.1)
Potassium: 3.8 mmol/L (ref 3.5–5.1)
Sodium: 136 mmol/L (ref 135–145)
Sodium: 132 mmol/L — ABNORMAL LOW (ref 135–145)
Total Bilirubin: 0.8 mg/dL (ref 0.3–1.2)
Total Bilirubin: 0.9 mg/dL (ref 0.3–1.2)
Total Protein: 6.9 g/dL (ref 6.5–8.1)
Total Protein: 5.9 g/dL — ABNORMAL LOW (ref 6.5–8.1)

## 2021-08-07 LAB — ECHOCARDIOGRAM COMPLETE
AR max vel: 2.16 cm2
AV Area VTI: 2.09 cm2
AV Area mean vel: 1.91 cm2
AV Mean grad: 4 mmHg
AV Peak grad: 5.7 mmHg
Ao pk vel: 1.19 m/s
Area-P 1/2: 3.91 cm2
S' Lateral: 2 cm

## 2021-08-07 LAB — TROPONIN I (HIGH SENSITIVITY)
Troponin I (High Sensitivity): 29 ng/L — ABNORMAL HIGH (ref <18)
Troponin I (High Sensitivity): 39 ng/L — ABNORMAL HIGH (ref <18)
Troponin I (High Sensitivity): 48 ng/L — ABNORMAL HIGH (ref <18)

## 2021-08-07 LAB — BRAIN NATRIURETIC PEPTIDE: B Natriuretic Peptide: 54 pg/mL (ref 0.0–100.0)

## 2021-08-07 LAB — CBC
HCT: 34.9 % — ABNORMAL LOW (ref 36.0–46.0)
Hemoglobin: 10.5 g/dL — ABNORMAL LOW (ref 12.0–15.0)
MCH: 29.4 pg (ref 26.0–34.0)
MCHC: 30.1 g/dL (ref 30.0–36.0)
MCV: 97.8 fL (ref 80.0–100.0)
Platelets: 237 10*3/uL (ref 150–400)
RBC: 3.57 MIL/uL — ABNORMAL LOW (ref 3.87–5.11)
RDW: 16.7 % — ABNORMAL HIGH (ref 11.5–15.5)
WBC: 20.2 10*3/uL — ABNORMAL HIGH (ref 4.0–10.5)
nRBC: 0 % (ref 0.0–0.2)

## 2021-08-07 LAB — PROCALCITONIN: Procalcitonin: <0.1 ng/mL

## 2021-08-07 LAB — LACTIC ACID, PLASMA: Lactic Acid, Venous: 1.7 mmol/L (ref 0.5–1.9)

## 2021-08-07 LAB — PHOSPHORUS: Phosphorus: 3.9 mg/dL (ref 2.5–4.6)

## 2021-08-07 LAB — HEPARIN LEVEL (UNFRACTIONATED)
Heparin Unfractionated: 0.14 IU/mL — ABNORMAL LOW (ref 0.30–0.70)
Heparin Unfractionated: 0.67 IU/mL (ref 0.30–0.70)

## 2021-08-07 LAB — MAGNESIUM: Magnesium: 1.6 mg/dL — ABNORMAL LOW (ref 1.7–2.4)

## 2021-08-07 MED ORDER — QUETIAPINE FUMARATE 100 MG PO TABS
100.0000 mg | ORAL_TABLET | Freq: Every day | ORAL | Status: DC
  Administered 2021-08-07 – 2021-08-15 (×9): 100 mg via ORAL
  Filled 2021-08-07 (×9): qty 1

## 2021-08-07 MED ORDER — ACYCLOVIR 400 MG PO TABS
400.0000 mg | ORAL_TABLET | Freq: Two times a day (BID) | ORAL | Status: DC
  Administered 2021-08-07 – 2021-08-15 (×17): 400 mg via ORAL
  Filled 2021-08-07 (×18): qty 1

## 2021-08-07 MED ORDER — PANTOPRAZOLE SODIUM 40 MG IV SOLR
40.0000 mg | Freq: Every day | INTRAVENOUS | Status: DC
  Administered 2021-08-07 – 2021-08-11 (×5): 40 mg via INTRAVENOUS
  Filled 2021-08-07 (×5): qty 40

## 2021-08-07 MED ORDER — FENTANYL CITRATE PF 50 MCG/ML IJ SOSY
25.0000 ug | PREFILLED_SYRINGE | INTRAMUSCULAR | Status: DC | PRN
  Administered 2021-08-13 – 2021-08-14 (×5): 25 ug via INTRAVENOUS
  Filled 2021-08-07 (×5): qty 1

## 2021-08-07 MED ORDER — HEPARIN (PORCINE) 25000 UT/250ML-% IV SOLN
1100.0000 [IU]/h | INTRAVENOUS | Status: DC
  Administered 2021-08-07: 900 [IU]/h via INTRAVENOUS
  Administered 2021-08-08 – 2021-08-09 (×2): 1100 [IU]/h via INTRAVENOUS
  Filled 2021-08-07 (×3): qty 250

## 2021-08-07 MED ORDER — NALOXONE HCL 0.4 MG/ML IJ SOLN: 0.4000 mg | INTRAMUSCULAR | Status: DC | PRN

## 2021-08-07 MED ORDER — MIRABEGRON ER 50 MG PO TB24
50.0000 mg | ORAL_TABLET | Freq: Every day | ORAL | Status: DC
  Administered 2021-08-07 – 2021-08-15 (×9): 50 mg via ORAL
  Filled 2021-08-07 (×9): qty 1

## 2021-08-07 MED ORDER — ACETAMINOPHEN 650 MG RE SUPP: 650.0000 mg | Freq: Four times a day (QID) | RECTAL | Status: DC | PRN

## 2021-08-07 MED ORDER — SODIUM CHLORIDE 0.9 % IV SOLN
250.0000 mg | Freq: Two times a day (BID) | INTRAVENOUS | Status: DC
  Administered 2021-08-07 – 2021-08-12 (×11): 250 mg via INTRAVENOUS
  Filled 2021-08-07 (×14): qty 2.5

## 2021-08-07 MED ORDER — DULOXETINE HCL 60 MG PO CPEP
60.0000 mg | ORAL_CAPSULE | Freq: Every day | ORAL | Status: DC
  Administered 2021-08-07 – 2021-08-15 (×9): 60 mg via ORAL
  Filled 2021-08-07 (×10): qty 1

## 2021-08-07 MED ORDER — IOHEXOL 350 MG/ML SOLN
100.0000 mL | Freq: Once | INTRAVENOUS | Status: AC | PRN
  Administered 2021-08-07: 100 mL via INTRAVENOUS

## 2021-08-07 MED ORDER — VANCOMYCIN HCL 1500 MG/300ML IV SOLN: 1500.0000 mg | INTRAVENOUS | Status: DC

## 2021-08-07 MED ORDER — ACETAMINOPHEN 325 MG PO TABS
650.0000 mg | ORAL_TABLET | Freq: Four times a day (QID) | ORAL | Status: DC | PRN
  Administered 2021-08-10 – 2021-08-15 (×3): 650 mg via ORAL
  Filled 2021-08-07 (×4): qty 2

## 2021-08-07 MED ORDER — AZTREONAM 1 G IJ SOLR
1.0000 g | Freq: Three times a day (TID) | INTRAMUSCULAR | Status: DC
  Filled 2021-08-07 (×2): qty 1

## 2021-08-07 MED ORDER — DULOXETINE HCL 30 MG PO CPEP
30.0000 mg | ORAL_CAPSULE | Freq: Every morning | ORAL | Status: DC
  Administered 2021-08-08 – 2021-08-15 (×8): 30 mg via ORAL
  Filled 2021-08-07 (×7): qty 1

## 2021-08-07 MED ORDER — DIAZEPAM 5 MG PO TABS
5.0000 mg | ORAL_TABLET | Freq: Two times a day (BID) | ORAL | Status: DC | PRN
  Administered 2021-08-14 (×3): 5 mg via ORAL
  Filled 2021-08-07 (×3): qty 1

## 2021-08-07 NOTE — Progress Notes (Signed)
Patient ID: Katelyn Lamb, female   DOB: 1946/06/14, 75 y.o.   MRN: [id number] Patient admitted early this morning for shortness of breath and was found to have acute bilateral pulmonary embolism and started on heparin drip.  Patient seen and examined at bedside and plan of care discussed with her.  I have reviewed patient's medical records including this morning's H&P, current vitals, labs, medications myself.  Continue heparin drip.  Check 2D echo and lower extremity duplex.

## 2021-08-07 NOTE — Consult Note (Signed)
NAME:  Katelyn Lamb, MRN:  [id number], DOB:  15-Apr-1946, LOS: 0 ADMISSION DATE:  08/06/2021, CONSULTATION DATE:  12/5 REFERRING MD:  Dr. Starla Link, CHIEF COMPLAINT:  SOB   History of Present Illness:  75 y/o F who presented to [facility] ER on 12/4 with reports of shortness of breath.    The patient was recently admitted from 11/11-11/30 with UTI with associated sepsis.  Hospital course was complicated by epistasis in the setting of anticoagulation for AF.  She required ENT evaluation and cautery.  In addition she was evaluated by IR and required embolization for epistaxis.  Oxygen was weaned off prior to discharge.  She required a placement of suprapubic catheter for urinary retention.  At discharge, she was planned to follow up with ENT regarding anticoagulation given two prior episodes of DVT.  She was discharged post hospitalization to [facility].  Her family noted on 12/2 that she seemed to have increased work of breathing and abdominal distention.  Patient denied shortness of breath at that time.  Her vital signs were checked at the rehab on 12/4 with low oxygen saturations and tachycardia prompting evaluation.  She returned to the ER on 12/4 with reports of abnormal vital signs and mild shortness of breath.  She was mildly hypoxemic and placed on nasal cannula O2.  CT angio of the chest was assessed which revealed bilateral lobar, segmental and subsegmental pulmonary emboli with moderate clot burden and mild features of right heart strain.  CT of the abdomen raised question of possible ileus with portal venous gas in the liver.  Patient was admitted by [facility] and started on heparin infusion.    PCCM consulted for evaluation of pulmonary emboli.  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  Denies shortness of breath, chest pain, pain with inspiration, reports normal bowel movements. Son at bedside O2 1 L  Objective   Blood pressure 99/70, pulse (!) 102, temperature 98.5 F (36.9 C), temperature source Oral, resp. rate (!) 26, SpO2 96 %.        Intake/Output Summary (Last 24 hours) at 08/07/2021 0958 Last data filed at 08/07/2021 0143 Gross per 24 hour  Intake 1000 ml  Output --  Net 1000 ml   There were no vitals filed for this visit.  Examination: General: Elderly adult female lying on ER stretcher in no acute distress, chronically ill-appearing HENT: MM pink/moist, anicteric, no JVD, [place] O2 Lungs: Nonlabored, lungs bilaterally with few basilar crackles otherwise clear Cardiovascular: S1-S2 RRR, ST on monitor, no MRG Abdomen: Mild distention, hypoactive bowel sounds Extremities: Warm/dry, left lower extremity with slightly larger than right Neuro: AAOx4 GU: suprapubic catheter in place  Resolved Hospital Problem list      Assessment & Plan:   Bilateral Segmental & Subsegmental Pulmonary Emboli Acute Hypoxemic Respiratory Failure   Moderate clot burden mild right heart strain on CT. She has now had multiple episodes of DVT (both LE and pulmonary) in the past.  Recently had significant epistaxis requiring cautery, embolization and intubation.  PESI score 93, intermediate risk. Suspect this is provoked clot in the setting of immobility, prior hx of DVT and break in anticoagulation  -continue heparin infusion per pharmacy  -monitor for bleeding -requiring 1L O2, wean oxygen to off as able.  May be irritant to nasal passages given recent epistaxis.  -await 2D ECHO  -assess LE  venous duplex -no indication for advanced therapies such as thrombectomy or catheter directed tPA at this time -would advocate for her to go back on DOAC given immobility issues.  Coumadin would be difficult for her to manage and put her at risk for further clot if she is not able to keep up with checks.  Additionally, DOACs have been shown to have less bleeding risk.   -reviewed risks/indications of full dose anticoagulation in the setting of clinical deterioration with patient and family.  She would be accepting if needed.  She does not meet criteria for this currently.  -DNR confirmed.  Would not want prolonged critical illness - states her quality of life has declined since her original illness and quality is important to her.   Best Practice (right click and "Reselect all SmartList Selections" daily)  Per Primary   Labs   CBC: Recent Labs  Lab 08/06/21 2225 08/06/21 2330 08/07/21 0740  WBC 24.4*  --  20.2*  NEUTROABS 18.2*  --   --   HGB 12.9 14.6 10.5*  HCT 42.2 43.0 34.9*  MCV 98.8  --  97.8  PLT 292  --  937    Basic Metabolic Panel: Recent Labs  Lab 08/06/21 2225 08/06/21 2330 08/07/21 0500  NA 136 135 132*  K 4.2 4.1 3.8  CL 102 106 100  CO2 20*  --  23  GLUCOSE 136* 132* 112*  BUN 27* 33* 21  CREATININE 0.74 0.80 0.67  CALCIUM 9.5  --  8.8*  MG  --   --  1.6*  PHOS  --   --  3.9   GFR: Estimated Creatinine Clearance: 68.6 mL/min (by C-G formula based on SCr of 0.67 mg/dL). Recent Labs  Lab 08/06/21 2225 08/06/21 2300 08/07/21 0556 08/07/21 0740  WBC 24.4*  --   --  20.2*  LATICACIDVEN  --  2.0* 1.7  --     Liver Function Tests: Recent Labs  Lab 08/06/21 2225 08/07/21 0500  AST 28 25  ALT 40 33  ALKPHOS 109 90  BILITOT 0.8 0.9  PROT 6.9 5.9*  ALBUMIN 3.5 2.9*   No results for input(s): LIPASE, AMYLASE in the last 168 hours. No results for input(s): AMMONIA in the last 168 hours.  ABG    Component Value Date/Time   PHART 7.424 07/18/2021  0607   PCO2ART 41.3 07/18/2021 0607   PO2ART 450 (H) 07/18/2021 0607   HCO3 27.4 07/18/2021 0607   TCO2 24 08/06/2021 2330   ACIDBASEDEF 0.9 02/12/2020 1220   O2SAT 100.0 07/18/2021 0607     Coagulation Profile: Recent Labs  Lab 08/06/21 2225  INR 1.0    Cardiac Enzymes: No results for input(s): CKTOTAL, CKMB, CKMBINDEX, TROPONINI in the last 168 hours.  HbA1C: Hgb A1c MFr Bld  Date/Time Value Ref Range Status  03/15/2021 04:43 AM 5.9 (H) 4.8 - 5.6 % Final    Comment:    (NOTE) Pre diabetes:          5.7%-6.4%  Diabetes:              >6.4%  Glycemic control for   <7.0% adults  with diabetes     CBG: No results for input(s): GLUCAP in the last 168 hours.  Review of Systems: Positives in [place]  Gen: Denies fever, chills, weight change, fatigue, night sweats HEENT: Denies blurred vision, double vision, hearing loss, tinnitus, sinus congestion, rhinorrhea, sore throat, neck stiffness, dysphagia PULM: Denies shortness of breath, cough, sputum production, hemoptysis, wheezing CV: Denies chest pain, edema, orthopnea, paroxysmal nocturnal dyspnea, palpitations GI: Denies abdominal distention, pain, nausea, vomiting, diarrhea, hematochezia, melena, constipation, change in bowel habits GU: Denies dysuria, hematuria, polyuria, oliguria, urethral discharge Endocrine: Denies hot or cold intolerance, polyuria, polyphagia or appetite change Derm: Denies rash, dry skin, scaling or peeling skin change Heme: Denies easy bruising, bleeding, bleeding gums Neuro: Denies headache, numbness, weakness, slurred speech, loss of memory or consciousness   Past Medical History:  She,  has a past medical history of Acute deep vein thrombosis (DVT) of popliteal vein of left lower extremity ([place]), Anxiety, Back pain, Chronic kidney disease, Encephalomyelitis, Gait abnormality (11/14/2016), Memory difficulty (03/02/2019), Myelitis due to herpes simplex ([place]), and Neurogenic bladder.   Surgical  History:   Past Surgical History:  Procedure Laterality Date   ABDOMINAL HYSTERECTOMY     BLADDER REPAIR     CESAREAN SECTION     IR ANGIO EXTERNAL CAROTID SEL EXT CAROTID BILAT MOD SED  07/18/2021   IR ANGIO INTRA EXTRACRAN SEL INTERNAL CAROTID BILAT MOD SED  07/18/2021   IR KYPHO LUMBAR INC FX REDUCE BONE BX UNI/BIL CANNULATION INC/IMAGING  04/11/2018   IR NEURO EACH ADD'L AFTER BASIC UNI LEFT (MS)  07/18/2021   IR NEURO EACH ADD'L AFTER BASIC UNI RIGHT (MS)  07/18/2021   IR TRANSCATH/EMBOLIZ  07/18/2021   IR US GUIDE VASC ACCESS RIGHT  07/18/2021   RADIOLOGY WITH ANESTHESIA N/A 07/18/2021   Procedure: IR WITH ANESTHESIA;  Surgeon: Pedro Earls, MD;  Location: [facility];  Service: Radiology;  Laterality: N/A;   TUBAL LIGATION       Social History:   reports that she has quit smoking. She has never used smokeless tobacco. She reports that she does not drink alcohol and does not use drugs.   Family History:  Her family history includes Hypertension in her mother.   Allergies Allergies  Allergen Reactions   Demerol [Meperidine] Other (See Comments)    Hallucinations   Percocet [Oxycodone-Acetaminophen] Itching   Amoxicillin-Pot Clavulanate Diarrhea    Severe pain, headache, intestinal infection   Penicillins Itching and Rash    Tolerated amoxicillin November 2022  Has patient had a PCN reaction causing immediate rash, facial/tongue/throat swelling, SOB or lightheadedness with hypotension:  NO Has patient had a PCN reaction causing severe rash involving mucus membranes or skin necrosis: No Has patient had a PCN reaction that required hospitalization: No Has patient had a PCN reaction occurring within the last 10 years: Yes If all of the above answers are "NO", then may proceed with Cephalosporin use.     Home Medications  Prior to Admission medications   Medication Sig Start Date End Date Taking? Authorizing Provider  acyclovir (ZOVIRAX) 400 MG tablet TAKE 1  TABLET BY MOUTH TWICE A DAY Patient taking differently: Take 400 mg by mouth 2 (two) times daily. 03/27/21   [name], MD  Ascorbic Acid (VITAMIN C) 500 MG CHEW Chew 500 mg by mouth daily.    [provider]  Calcium Carb-Cholecalciferol (CALCIUM 600 + D PO) Take 1 tablet by mouth in the morning and at bedtime.  [provider]  Cyanocobalamin (VITAMIN B-12 PO) Take 1 tablet by mouth daily.    [provider]  diazepam (VALIUM) 5 MG tablet Take 1 tablet (5 mg total) by mouth in the morning and at bedtime. For tremors 08/01/21   Oswald Hillock, MD  diclofenac Sodium (VOLTAREN) 1 % GEL Apply 1 application topically 2 (two) times daily as needed (nerve pain).    [provider]  docusate sodium (COLACE) 100 MG capsule Take 200 mg by mouth daily as needed (constipation).    [provider]  DULoxetine (CYMBALTA) 30 MG capsule Take 30 mg by mouth in the morning. 03/17/21   [provider]  DULoxetine (CYMBALTA) 60 MG capsule Take 1 capsule (60 mg total) by mouth at bedtime. 08/01/21   Oswald Hillock, MD  Emollient (CERAVE) CREA Apply 1 application topically daily as needed (bruise spots).    [provider]  ferrous sulfate 325 (65 FE) MG tablet Take 325 mg by mouth every morning. 03/10/21   [provider]  HYDROcodone-acetaminophen (NORCO) 10-325 MG tablet Take 1 tablet by mouth every 6 (six) hours as needed. 08/01/21   Oswald Hillock, MD  levETIRAcetam (KEPPRA) 250 MG tablet TAKE 1 TABLET BY MOUTH TWICE A DAY Patient taking differently: Take 250 mg by mouth 2 (two) times daily. 05/09/21   [name], MD  mirabegron ER (MYRBETRIQ) 50 MG TB24 tablet Take 50 mg by mouth at bedtime.    [provider]  Multiple Vitamins-Minerals (HAIR/SKIN/NAILS/BIOTIN) TABS Take 1 tablet by mouth every morning.    [provider]  omega-3 acid ethyl esters (LOVAZA) 1 g capsule Take 1 g by mouth every morning.    [provider]  pantoprazole (PROTONIX) 40 MG tablet Take 1 tablet (40 mg total) by mouth daily. Patient taking differently: Take 40 mg by mouth at bedtime. 10/26/16   Angiulli, Lavon Paganini, PA-C  polyethylene glycol (MIRALAX / GLYCOLAX) packet Take 17 g by mouth daily. Patient taking differently: Take 17 g by mouth daily as needed (constipation). 10/26/16   Angiulli, Lavon Paganini, PA-C  predniSONE (DELTASONE) 10 MG tablet Prednisone 40 mg po daily x 1 day then Prednisone 30 mg po daily x 1 day then Prednisone 20 mg po daily x 1 day then Prednisone 10 mg daily x 1 day then Prednisone 5 mg po daily as your usual dose 08/01/21   Iraq, Marge Duncans, MD  predniSONE (DELTASONE) 5 MG tablet TAKE 1 TABLET BY MOUTH EVERY DAY WITH BREAKFAST Patient taking differently: Take 5 mg by mouth daily with breakfast. 03/27/21   [name], MD  pregabalin (LYRICA) 50 MG capsule Take 2 capsules (100 mg total) by mouth at bedtime. 02/09/21   [name], MD  QUEtiapine (SEROQUEL) 100 MG tablet Take 100 mg by mouth at bedtime. 01/25/21   [provider]  sodium chloride (OCEAN) 0.65 % SOLN nasal spray Place 1 spray into both nostrils as needed for congestion. 08/01/21   Oswald Hillock, MD         Noe Gens, MSN, APRN, NP-C, AGACNP-BC [facility] 08/07/2021, 9:58 AM   Please see [email or web address] for pager details.   From 7A-7P if no response, please call [phone] After hours, please call ELink [phone]

## 2021-08-07 NOTE — ED Notes (Signed)
Patient transported to CT 

## 2021-08-07 NOTE — Progress Notes (Signed)
ANTICOAGULATION CONSULT NOTE - Follow Up Consult  Pharmacy Consult for heparin Indication: new PE, hx of DVT (PTA Xarelto - stopped 2/2 nosebleeds and was reversed with Eppie Gibson on 11/15)  Allergies  Allergen Reactions   Demerol [Meperidine] Other (See Comments)    Hallucinations   Percocet [Oxycodone-Acetaminophen] Itching   Amoxicillin-Pot Clavulanate Diarrhea    Severe pain, headache, intestinal infection   Penicillins Itching and Rash    Tolerated amoxicillin November 2022  Has patient had a PCN reaction causing immediate rash, facial/tongue/throat swelling, SOB or lightheadedness with hypotension:  NO Has patient had a PCN reaction causing severe rash involving mucus membranes or skin necrosis: No Has patient had a PCN reaction that required hospitalization: No Has patient had a PCN reaction occurring within the last 10 years: Yes If all of the above answers are "NO", then may proceed with Cephalosporin use.    Patient Measurements: Height: 5\' 7"  (170.2 cm) Weight: 81 kg (178 lb 9.2 oz) IBW/kg (Calculated) : 61.6 Heparin Dosing Weight: 78.2 kg  Vital Signs: Temp: 98.1 F (36.7 C) (12/05 2030) Temp Source: Oral (12/05 2030) BP: 125/86 (12/05 2030) Pulse Rate: 107 (12/05 2030)  Labs: Recent Labs    08/06/21 2225 08/06/21 2330 08/07/21 0052 08/07/21 0500 08/07/21 0740 08/07/21 1140 08/07/21 2129  HGB 12.9 14.6  --   --  10.5*  --   --   HCT 42.2 43.0  --   --  34.9*  --   --   PLT 292  --   --   --  237  --   --   APTT 28  --   --   --   --   --   --   LABPROT 13.4  --   --   --   --   --   --   INR 1.0  --   --   --   --   --   --   HEPARINUNFRC  --   --   --   --   --  0.14* 0.67  CREATININE 0.74 0.80  --  0.67  --   --   --   TROPONINIHS 39*  --  48*  --  29*  --   --     Estimated Creatinine Clearance: 68.6 mL/min (by C-G formula based on SCr of 0.67 mg/dL).     Assessment:  75 yo F with acute BL segmental and subsegmental PE with mild RHS on 12/5.  Patient with DVT hx on rivaroxaban, which caused epistaxis requiring IR embolization and Kcentra reversal 11/15. No anticoagulation prior to admission. Pharmacy consulted for heparin.     Heparin level 0.67 is at the high end of therapeutic on 1150 units/hr. Hgb dropped but suspect dilution.  No issues with infusion or bleeding per RN.   Goal of Therapy:  Heparin level 0.3-0.7 units/ml Monitor platelets by anticoagulation protocol: Yes   Plan: Decrease heparin infusion to 1100 units/hr Monitor daily HL, CBC/plt Monitor for signs/symptoms of bleeding    Benetta Spar, PharmD, BCPS, BCCP Clinical Pharmacist  Please check AMION for all [place] phone numbers After 10:00 PM, call [place]

## 2021-08-07 NOTE — Progress Notes (Signed)
ANTICOAGULATION CONSULT NOTE - Follow Up Consult  Pharmacy Consult for heparin Indication: new PE, hx of DVT (PTA Xarelto - stopped 2/2 nosebleeds and was reversed with Eppie Gibson on 11/15)  Allergies  Allergen Reactions   Demerol [Meperidine] Other (See Comments)    Hallucinations   Percocet [Oxycodone-Acetaminophen] Itching   Amoxicillin-Pot Clavulanate Diarrhea    Severe pain, headache, intestinal infection   Penicillins Itching and Rash    Tolerated amoxicillin November 2022  Has patient had a PCN reaction causing immediate rash, facial/tongue/throat swelling, SOB or lightheadedness with hypotension:  NO Has patient had a PCN reaction causing severe rash involving mucus membranes or skin necrosis: No Has patient had a PCN reaction that required hospitalization: No Has patient had a PCN reaction occurring within the last 10 years: Yes If all of the above answers are "NO", then may proceed with Cephalosporin use.    Patient Measurements: Height: 5\' 7"  (170.2 cm) Weight: 81 kg (178 lb 9.2 oz) IBW/kg (Calculated) : 61.6 Heparin Dosing Weight: 78.2 kg  Vital Signs: Temp: 98.5 F (36.9 C) (12/05 0217) Temp Source: Oral (12/05 0217) BP: 96/75 (12/05 1200) Pulse Rate: 103 (12/05 1200)  Labs: Recent Labs    08/06/21 2225 08/06/21 2330 08/07/21 0052 08/07/21 0500 08/07/21 0740 08/07/21 1140  HGB 12.9 14.6  --   --  10.5*  --   HCT 42.2 43.0  --   --  34.9*  --   PLT 292  --   --   --  237  --   APTT 28  --   --   --   --   --   LABPROT 13.4  --   --   --   --   --   INR 1.0  --   --   --   --   --   HEPARINUNFRC  --   --   --   --   --  0.14*  CREATININE 0.74 0.80  --  0.67  --   --   TROPONINIHS 39*  --  48*  --  29*  --     Estimated Creatinine Clearance: 68.6 mL/min (by C-G formula based on SCr of 0.67 mg/dL).   Medications: see MAR  Assessment: 75 yo F with acute PE on CT. CBC ok. Heparin level subtherapeutic this AM at 0.14 - IV site ok, no s/sx of bleeding per  RN.   Goal of Therapy:  Heparin level 0.3-0.7 units/ml Monitor platelets by anticoagulation protocol: Yes   Plan: Increase heparin infusion from 900 units/hr to 1150 units/hr (~3 units/kg/hr) Monitor daily CBC and HL F/u 6h HL   Joetta Manners, PharmD, [facility] Clinical Pharmacist ED RPh Phone: [place]: [phone]

## 2021-08-07 NOTE — Progress Notes (Signed)
ANTICOAGULATION CONSULT NOTE - Initial Consult  Pharmacy Consult for Heparin  Indication: New onset PE, history of DVT  Allergies  Allergen Reactions   Demerol [Meperidine] Other (See Comments)    Hallucinations   Percocet [Oxycodone-Acetaminophen] Itching   Amoxicillin-Pot Clavulanate Diarrhea    Severe pain, headache, intestinal infection   Penicillins Itching and Rash    Tolerated amoxicillin November 2022  Has patient had a PCN reaction causing immediate rash, facial/tongue/throat swelling, SOB or lightheadedness with hypotension:  NO Has patient had a PCN reaction causing severe rash involving mucus membranes or skin necrosis: No Has patient had a PCN reaction that required hospitalization: No Has patient had a PCN reaction occurring within the last 10 years: Yes If all of the above answers are "NO", then may proceed with Cephalosporin use.     Vital Signs: Temp: 98.5 F (36.9 C) (12/05 0217) Temp Source: Oral (12/05 0217) BP: 116/72 (12/05 0217) Pulse Rate: 109 (12/05 0217)  Labs: Recent Labs    08/06/21 2225 08/06/21 2330 08/07/21 0052  HGB 12.9 14.6  --   HCT 42.2 43.0  --   PLT 292  --   --   APTT 28  --   --   LABPROT 13.4  --   --   INR 1.0  --   --   CREATININE 0.74 0.80  --   TROPONINIHS 39*  --  48*    Estimated Creatinine Clearance: 68.6 mL/min (by C-G formula based on SCr of 0.8 mg/dL).   Medical History: Past Medical History:  Diagnosis Date   Acute deep vein thrombosis (DVT) of popliteal vein of left lower extremity (HCC)    Anxiety    Back pain    Chronic kidney disease    Encephalomyelitis    Gait abnormality 11/14/2016   Memory difficulty 03/02/2019   Myelitis due to herpes simplex [facility])    Neurogenic bladder      Assessment: 75 y/o F with new onset PE on CT. Previously on Xarelto for history of DVT but this was recent stopped due to severe epistaxis. CBC/renal function good.   Goal of Therapy:  Heparin level 0.3-0.5 units/ml  (recent epistaxis) Monitor platelets by anticoagulation protocol: Yes   Plan:  No boluses  Start heparin drip at 900 units/hr 1000 heparin level Monitor closely for bleeding  [name], PharmD, BCPS Clinical Pharmacist Phone: [phone]

## 2021-08-07 NOTE — ED Notes (Signed)
Called 5W about purple man, 5W RN notified.

## 2021-08-07 NOTE — ED Notes (Signed)
RN paged admitting for patients husband requesting update at this time. MD states he will call patients husband tomorrow after rounds. Patients husband informed.

## 2021-08-07 NOTE — ED Notes (Signed)
Previous RN received call from lab needing another green tube for troponin level. This RN sent repeat tube at this time.

## 2021-08-07 NOTE — H&P (Signed)
History and Physical    PLEASE NOTE THAT DRAGON DICTATION SOFTWARE WAS USED IN THE CONSTRUCTION OF THIS NOTE.   Katelyn Lamb QVZ:[id number] DOB: [birth year] DOA: 08/06/2021  PCP: Katelyn Amel, MD  Patient coming from: home   I have personally briefly reviewed patient's old medical records in [place]  Chief Complaint: Shortness of breath  HPI: Katelyn Lamb is a 75 y.o. female with medical history significant for prior DVT, seizure disorder, GERD, who is admitted to [facility] on 08/06/2021 with acute bilateral pulmonary emboli with suggestion of cor pulmonale after presenting from home to [facility] ED complaining of shortness of breath.   Patient recently hospitalized at [facility] during which she was treated for UTI in mid November.  In the context of a history of DVT for which she was being treated with Xarelto, she experienced epistaxis during this previous hospitalization, during which [employer] was reportedly unsuccessful in achieving associated hemostasis, prompting IR embolization on November 15, no reported new epistaxis since that time however, Xarelto has been held since this previous episode of epistaxis.  He was also noted to have acute hypoxic respiratory failure during his previous hospitalization, with discharge summary conveying that this has been weaned back to room air leading up to discharge from hospital.  She was also noted to be discharged on prednisone taper.  Today, the patient presents to [name] ED complaining of 1 day of progressive shortness of breath associated with subjective fever.  Denies any associated orthopnea, PND, or new onset peripheral edema.  No calf tenderness Extremity erythema.  Denies any associated chest pain, palpitations, diaphoresis, dizziness, presyncope, or syncope.  No wheezing or hemoptysis.  She reports 2 episodes of loose stool on the morning of 08/06/2021, prompting receipt of 2 doses of Lomotil.  Subsequent to  taking the second dose of Lomotil, the patient reports diffuse abdominal cramping.  She conveys subsequent decline in flatus production.  Denies any associated nausea, vomiting.  Denies any associated dysuria or gross hematuria.   ED Course:  Vital signs in the ED were notable for the following: 1.4, with ensuing improvement to 98.5 following interval acetaminophen.  Heart rate initially 125, with ensuing improvement into the range of 10 3-1 09; blood pressure 108/77 130/83; respiratory rate 86 to 88% on room air, with ensuing improvement to 94 to 95% 2 L nasal cannula.-  Labs were notable for the following: CMP notable for the following: Creatinine 0.74, liver enzymes within normal limits.  High-sensitivity troponin I initially noted to be 39, with repeat value trending up slightly to 48.  CBC notable for platelets of 24,000, hemoglobin 12.9, platelet count 292.  COVID-19/influenza PCR were found to be negative.  Blood cultures x2 collected.  Imaging and additional notable ED work-up: EKG showed sinus tachycardia, with heart rate 124, no evidence of T wave or ST changes, including no evidence of ST elevation.  Chest x-ray showed potential mild perihilar vascular congestion, but otherwise showed no evidence of acute cardiopulmonary process.  CTA chest showed bilateral lobar pulmonary emboli, some of which are occlusive, with moderate clot burden, and evidence of right heart strain, also showing interstitial haziness medially within the right upper lobe, consistent with low lung volumes, per radiology.   While in the ED, the following were administered  initially there was concern for sepsis in the setting of fever and leukocytosis, prompting initiation of stream, pain, Flagyl.  She has received acetaminophen 650 mg rectally x1.  Heparin  drip; LR bolus x500 cc followed by initiation of continuous LR at 150 cc/hr. associated with the patient is admitted to [facility] for further evaluation and management of  presenting acute bilateral pulmonary emboli with evidence of cor pulmonale as well as further evaluation management of her generalized abdominal discomfort, nonspecific findings on CT abdomen/pelvis.      Review of Systems: As per HPI otherwise 10 point review of systems negative.   Past Medical History:  Diagnosis Date   Acute deep vein thrombosis (DVT) of popliteal vein of left lower extremity (HCC)    Anxiety    Back pain    Chronic kidney disease    Encephalomyelitis    Gait abnormality 11/14/2016   Memory difficulty 03/02/2019   Myelitis due to herpes simplex [facility])    Neurogenic bladder     Past Surgical History:  Procedure Laterality Date   ABDOMINAL HYSTERECTOMY     BLADDER REPAIR     CESAREAN SECTION     IR ANGIO EXTERNAL CAROTID SEL EXT CAROTID BILAT MOD SED  07/18/2021   IR ANGIO INTRA EXTRACRAN SEL INTERNAL CAROTID BILAT MOD SED  07/18/2021   IR KYPHO LUMBAR INC FX REDUCE BONE BX UNI/BIL CANNULATION INC/IMAGING  04/11/2018   IR NEURO EACH ADD'L AFTER BASIC UNI LEFT (MS)  07/18/2021   IR NEURO EACH ADD'L AFTER BASIC UNI RIGHT (MS)  07/18/2021   IR TRANSCATH/EMBOLIZ  07/18/2021   IR US GUIDE VASC ACCESS RIGHT  07/18/2021   RADIOLOGY WITH ANESTHESIA N/A 07/18/2021   Procedure: IR WITH ANESTHESIA;  Surgeon: Pedro Earls, MD;  Location: [facility];  Service: Radiology;  Laterality: N/A;   TUBAL LIGATION      Social History:  reports that she has quit smoking. She has never used smokeless tobacco. She reports that she does not drink alcohol and does not use drugs.   Allergies  Allergen Reactions   Demerol [Meperidine] Other (See Comments)    Hallucinations   Percocet [Oxycodone-Acetaminophen] Itching   Amoxicillin-Pot Clavulanate Diarrhea    Severe pain, headache, intestinal infection   Penicillins Itching and Rash    Tolerated amoxicillin November 2022  Has patient had a PCN reaction causing immediate rash, facial/tongue/throat swelling, SOB or  lightheadedness with hypotension:  NO Has patient had a PCN reaction causing severe rash involving mucus membranes or skin necrosis: No Has patient had a PCN reaction that required hospitalization: No Has patient had a PCN reaction occurring within the last 10 years: Yes If all of the above answers are "NO", then may proceed with Cephalosporin use.    Family History  Problem Relation Age of Onset   Hypertension Mother     Family history reviewed and not pertinent    Prior to Admission medications   Medication Sig Start Date End Date Taking? Authorizing Provider  acyclovir (ZOVIRAX) 400 MG tablet TAKE 1 TABLET BY MOUTH TWICE A DAY Patient taking differently: Take 400 mg by mouth 2 (two) times daily. 03/27/21   [name], MD  Ascorbic Acid (VITAMIN C) 500 MG CHEW Chew 500 mg by mouth daily.    [provider]  Calcium Carb-Cholecalciferol (CALCIUM 600 + D PO) Take 1 tablet by mouth in the morning and at bedtime.    [provider]  Cyanocobalamin (VITAMIN B-12 PO) Take 1 tablet by mouth daily.    [provider]  diazepam (VALIUM) 5 MG tablet Take 1 tablet (5 mg total) by mouth in the morning and at bedtime.  For tremors 08/01/21   Oswald Hillock, MD  diclofenac Sodium (VOLTAREN) 1 % GEL Apply 1 application topically 2 (two) times daily as needed (nerve pain).    [provider]  docusate sodium (COLACE) 100 MG capsule Take 200 mg by mouth daily as needed (constipation).    [provider]  DULoxetine (CYMBALTA) 30 MG capsule Take 30 mg by mouth in the morning. 03/17/21   [provider]  DULoxetine (CYMBALTA) 60 MG capsule Take 1 capsule (60 mg total) by mouth at bedtime. 08/01/21   Oswald Hillock, MD  Emollient (CERAVE) CREA Apply 1 application topically daily as needed (bruise spots).    [provider]  ferrous sulfate 325 (65 FE) MG tablet Take 325 mg by mouth every morning. 03/10/21   [provider]   HYDROcodone-acetaminophen (NORCO) 10-325 MG tablet Take 1 tablet by mouth every 6 (six) hours as needed. 08/01/21   Oswald Hillock, MD  levETIRAcetam (KEPPRA) 250 MG tablet TAKE 1 TABLET BY MOUTH TWICE A DAY Patient taking differently: Take 250 mg by mouth 2 (two) times daily. 05/09/21   [name], MD  mirabegron ER (MYRBETRIQ) 50 MG TB24 tablet Take 50 mg by mouth at bedtime.    [provider]  Multiple Vitamins-Minerals (HAIR/SKIN/NAILS/BIOTIN) TABS Take 1 tablet by mouth every morning.    [provider]  omega-3 acid ethyl esters (LOVAZA) 1 g capsule Take 1 g by mouth every morning.    [provider]  pantoprazole (PROTONIX) 40 MG tablet Take 1 tablet (40 mg total) by mouth daily. Patient taking differently: Take 40 mg by mouth at bedtime. 10/26/16   Angiulli, Lavon Paganini, PA-C  polyethylene glycol (MIRALAX / GLYCOLAX) packet Take 17 g by mouth daily. Patient taking differently: Take 17 g by mouth daily as needed (constipation). 10/26/16   Angiulli, Lavon Paganini, PA-C  predniSONE (DELTASONE) 10 MG tablet Prednisone 40 mg po daily x 1 day then Prednisone 30 mg po daily x 1 day then Prednisone 20 mg po daily x 1 day then Prednisone 10 mg daily x 1 day then Prednisone 5 mg po daily as your usual dose 08/01/21   Iraq, Marge Duncans, MD  predniSONE (DELTASONE) 5 MG tablet TAKE 1 TABLET BY MOUTH EVERY DAY WITH BREAKFAST Patient taking differently: Take 5 mg by mouth daily with breakfast. 03/27/21   [name], MD  pregabalin (LYRICA) 50 MG capsule Take 2 capsules (100 mg total) by mouth at bedtime. 02/09/21   [name], MD  QUEtiapine (SEROQUEL) 100 MG tablet Take 100 mg by mouth at bedtime. 01/25/21   [provider]  sodium chloride (OCEAN) 0.65 % SOLN nasal spray Place 1 spray into both nostrils as needed for congestion. 08/01/21   Oswald Hillock, MD     Objective    Physical Exam: Vitals:   08/07/21 0015 08/07/21 0045 08/07/21 0115 08/07/21 0217  BP:  (!) 140/99 129/79 119/73 116/72  Pulse: (!) 116 (!) 111 (!) 110 (!) 109  Resp: (!) 27 (!) 21 20 (!) 22  Temp:    98.5 F (36.9 C)  TempSrc:    Oral  SpO2: 97% 95% 95% 92%    General: appears to be stated age; alert Skin: warm, dry, no rash Head:  AT/[place] Mouth:  Oral mucosa membranes appear dry, normal dentition Neck: supple; trachea midline Heart:  RRR; did not appreciate any M/R/G Lungs: CTAB, did not appreciate any wheezes, rales, or rhonchi Abdomen: +  BS; soft, mild distention; mild generalized tenderness, without evidence of guarding, rigidity, or rebound tenderness. Vascular: 2+ pedal pulses b/l; 2+ radial pulses b/l Extremities: no peripheral edema, no muscle wasting Neuro: strength and sensation intact in upper and lower extremities b/l   Labs on Admission: I have personally reviewed following labs and imaging studies  CBC: Recent Labs  Lab 08/06/21 2225 08/06/21 2330  WBC 24.4*  --   NEUTROABS 18.2*  --   HGB 12.9 14.6  HCT 42.2 43.0  MCV 98.8  --   PLT 292  --    Basic Metabolic Panel: Recent Labs  Lab 08/06/21 2225 08/06/21 2330  NA 136 135  K 4.2 4.1  CL 102 106  CO2 20*  --   GLUCOSE 136* 132*  BUN 27* 33*  CREATININE 0.74 0.80  CALCIUM 9.5  --    GFR: Estimated Creatinine Clearance: 68.6 mL/min (by C-G formula based on SCr of 0.8 mg/dL). Liver Function Tests: Recent Labs  Lab 08/06/21 2225  AST 28  ALT 40  ALKPHOS 109  BILITOT 0.8  PROT 6.9  ALBUMIN 3.5   No results for input(s): LIPASE, AMYLASE in the last 168 hours. No results for input(s): AMMONIA in the last 168 hours. Coagulation Profile: Recent Labs  Lab 08/06/21 2225  INR 1.0   Cardiac Enzymes: No results for input(s): CKTOTAL, CKMB, CKMBINDEX, TROPONINI in the last 168 hours. BNP (last 3 results) No results for input(s): PROBNP in the last 8760 hours. HbA1C: No results for input(s): HGBA1C in the last 72 hours. CBG: No results for input(s): GLUCAP in the last 168  hours. Lipid Profile: No results for input(s): CHOL, HDL, LDLCALC, TRIG, CHOLHDL, LDLDIRECT in the last 72 hours. Thyroid Function Tests: No results for input(s): TSH, T4TOTAL, FREET4, T3FREE, THYROIDAB in the last 72 hours. Anemia Panel: No results for input(s): VITAMINB12, FOLATE, FERRITIN, TIBC, IRON, RETICCTPCT in the last 72 hours. Urine analysis:    Component Value Date/Time   COLORURINE YELLOW 08/06/2021 2300   APPEARANCEUR CLOUDY (A) 08/06/2021 2300   LABSPEC 1.021 08/06/2021 2300   PHURINE 5.0 08/06/2021 2300   GLUCOSEU NEGATIVE 08/06/2021 2300   HGBUR NEGATIVE 08/06/2021 2300   BILIRUBINUR NEGATIVE 08/06/2021 2300   KETONESUR NEGATIVE 08/06/2021 2300   PROTEINUR 30 (A) 08/06/2021 2300   NITRITE NEGATIVE 08/06/2021 2300   LEUKOCYTESUR MODERATE (A) 08/06/2021 2300    Radiological Exams on Admission: CT Angio Chest PE W and/or Wo Contrast  Result Date: 08/07/2021 CLINICAL DATA:  High probability of pulmonary thromboemboli. Abdominal distention also. EXAM: CT ANGIOGRAPHY CHEST CT ABDOMEN AND PELVIS WITH CONTRAST TECHNIQUE: Multidetector CT imaging of the chest was performed using the standard protocol during bolus administration of intravenous contrast. Multiplanar CT image reconstructions and MIPs were obtained to evaluate the vascular anatomy. Multidetector CT imaging of the abdomen and pelvis was performed using the standard protocol during bolus administration of intravenous contrast. CONTRAST:  127mL OMNIPAQUE IOHEXOL 350 MG/ML SOLN COMPARISON:  None. CTA chest 02/12/2020 and 07/23/2019, portable chest today and 07/27/2021, CT abdomen and pelvis without contrast 04/14/2021, CT abdomen pelvis with contrast 11/22/2015. FINDINGS: CTA CHEST FINDINGS Cardiovascular: Prominent pulmonary trunk 3.1 cm indicating arterial hypertension, with slightly prominent right heart chambers and IVC reflux compatible with right heart strain. There are bilateral nonocclusive emboli in the distal main  pulmonary arteries with near-occlusive thrombus in the interlobar artery and lateral and medial segmental right middle lobe arteries. There is occluding thrombus in the right upper lobe apical segmental and  subsegmental arteries, nonocclusive thrombus in the right upper lobe anterior segmental artery. There are multiple segmental and subsegmental right lower lobe arteries with nonocclusive thrombus, and additional nonoccluding thrombus in the left upper lobe anterior segmental artery and the left lower lobe main and 2 segmental arteries to the posterior and medial left base. There is moderate overall embolic burden which appears acute. There is mild cardiomegaly with slight right chamber predominance, trace calcification LAD coronary artery, small amount of scattered aortic calcification and mild aortic ectasia and tortuosity. There is no aortic aneurysm or dissection, with normal great vessel branching and opacification. There is no venous dilatation no pericardial effusion. Mediastinum/Nodes: No enlarged mediastinal, hilar, or axillary lymph nodes. Thyroid gland, trachea, and esophagus demonstrate no significant findings. Lungs/Pleura: No pleural effusion or pneumothorax. Lung bases show posterior atelectasis. There is interstitial haziness in the medial base of the right upper lobe which could be due to low lung volumes and micro atelectasis or pneumonitis. There are small caliber central airways and trachea which could indicate bronchospasm or be due to expiration. Remaining lungs are clear. Musculoskeletal: Osteopenia and degenerative change with mild dextroscoliosis thoracic spine. No concerning regional skeletal lesion. Review of the MIP images confirms the above findings. CT ABDOMEN and PELVIS FINDINGS Factors affecting image quality in the abdomen: Respiratory motion artifact. Hepatobiliary: There is no mass enhancement, but there is portal venous gas in the left hepatic lobe near the dome and along the  inter segmental division but not in the main portal vein. Etiology is unclear. Gallbladder and bile ducts unremarkable. Pancreas: Mildly prominent pancreatic duct which has been seen on prior studies. No mass enhancement or interval change. Spleen: Normal Adrenals/Urinary Tract: Unremarkable adrenal glands and renal cortex. No calculus or hydronephrosis. The bladder is contracted around a suprapubic Foley catheter and not well seen. Stomach/Bowel: The stomach is moderately distended with air and fluid. There is dilatation in the descending duodenum up to 3.2 cm to the right of the aorta, with duodenum and jejunum decompressed left of the aorta. Small bowel is otherwise unremarkable with a normal appendix. There is air and fluid distention of the ascending and transverse colon with the cecum up to 10.3 cm in diameter but no wall thickening. No other colonic dilatation is seen. There is fluid throughout the colon. No obstructing mass is visible. Vascular/Lymphatic: Aortic atherosclerosis. No enlarged abdominal or pelvic lymph nodes. Reproductive: The uterus is absent. No adnexal lesion is seen. Multiple pelvic phleboliths. Other: There is no free air, hemorrhage, fluid or bowel pneumatosis. Musculoskeletal: Osteopenia and compression fractures again noted at T12, L2, L3, L4 and L5, unchanged compared with lumbar spine MRI 07/15/2021 with kyphoplasty cement again seen at L2 and 5. There are degenerative changes of the lumbar spine, old healed left inferior pubic ramus fracture. IMPRESSION: 1. Bilateral lobar, segmental and subsegmental pulmonary arterial emboli, some of which are occlusive, with overall moderate clot burden and mild features of right heart strain. 2. Aortic atherosclerosis. 3. Cardiomegaly with scattered calcification LAD coronary artery. 4. Interstitial haziness medially in the right upper lobe which could relate to low lung volumes or pneumonitis. 5. Portal venous gas in the liver, unknown etiology.  This may be seen with necrotic bowel but there are no thickened segments or segments showing pneumatosis. There is a distended stomach and proximal duodenum with air and fluid which could be due to an ileus or intermittent SMA compression syndrome. There is also mild dilatation and fluid filling in the ascending colon without a  visible obstructing mass or small bowel dilatation in the remainder of the abdomen and pelvis. Adynamic ileus may be present with the portal venous gas related to dilatation in the stomach and/or ascending colon. Fluid is seen throughout the colon without wall thickening or inflammation. Close clinical follow-up and laboratory correlation recommended. 6. Osteopenia with lumbar and T12 compression fractures similar in appearance to an MRI of 07/15/2021. 7. Results phoned to Dr. Darl Householder at 1:01 a.m., 08/07/2021. Electronically Signed   By: Telford Nab M.D.   On: 08/07/2021 01:09   CT ABDOMEN PELVIS W CONTRAST  Result Date: 08/07/2021 CLINICAL DATA:  High probability of pulmonary thromboemboli. Abdominal distention also. EXAM: CT ANGIOGRAPHY CHEST CT ABDOMEN AND PELVIS WITH CONTRAST TECHNIQUE: Multidetector CT imaging of the chest was performed using the standard protocol during bolus administration of intravenous contrast. Multiplanar CT image reconstructions and MIPs were obtained to evaluate the vascular anatomy. Multidetector CT imaging of the abdomen and pelvis was performed using the standard protocol during bolus administration of intravenous contrast. CONTRAST:  136mL OMNIPAQUE IOHEXOL 350 MG/ML SOLN COMPARISON:  None. CTA chest 02/12/2020 and 07/23/2019, portable chest today and 07/27/2021, CT abdomen and pelvis without contrast 04/14/2021, CT abdomen pelvis with contrast 11/22/2015. FINDINGS: CTA CHEST FINDINGS Cardiovascular: Prominent pulmonary trunk 3.1 cm indicating arterial hypertension, with slightly prominent right heart chambers and IVC reflux compatible with right heart  strain. There are bilateral nonocclusive emboli in the distal main pulmonary arteries with near-occlusive thrombus in the interlobar artery and lateral and medial segmental right middle lobe arteries. There is occluding thrombus in the right upper lobe apical segmental and subsegmental arteries, nonocclusive thrombus in the right upper lobe anterior segmental artery. There are multiple segmental and subsegmental right lower lobe arteries with nonocclusive thrombus, and additional nonoccluding thrombus in the left upper lobe anterior segmental artery and the left lower lobe main and 2 segmental arteries to the posterior and medial left base. There is moderate overall embolic burden which appears acute. There is mild cardiomegaly with slight right chamber predominance, trace calcification LAD coronary artery, small amount of scattered aortic calcification and mild aortic ectasia and tortuosity. There is no aortic aneurysm or dissection, with normal great vessel branching and opacification. There is no venous dilatation no pericardial effusion. Mediastinum/Nodes: No enlarged mediastinal, hilar, or axillary lymph nodes. Thyroid gland, trachea, and esophagus demonstrate no significant findings. Lungs/Pleura: No pleural effusion or pneumothorax. Lung bases show posterior atelectasis. There is interstitial haziness in the medial base of the right upper lobe which could be due to low lung volumes and micro atelectasis or pneumonitis. There are small caliber central airways and trachea which could indicate bronchospasm or be due to expiration. Remaining lungs are clear. Musculoskeletal: Osteopenia and degenerative change with mild dextroscoliosis thoracic spine. No concerning regional skeletal lesion. Review of the MIP images confirms the above findings. CT ABDOMEN and PELVIS FINDINGS Factors affecting image quality in the abdomen: Respiratory motion artifact. Hepatobiliary: There is no mass enhancement, but there is portal  venous gas in the left hepatic lobe near the dome and along the inter segmental division but not in the main portal vein. Etiology is unclear. Gallbladder and bile ducts unremarkable. Pancreas: Mildly prominent pancreatic duct which has been seen on prior studies. No mass enhancement or interval change. Spleen: Normal Adrenals/Urinary Tract: Unremarkable adrenal glands and renal cortex. No calculus or hydronephrosis. The bladder is contracted around a suprapubic Foley catheter and not well seen. Stomach/Bowel: The stomach is moderately distended with air and fluid.  There is dilatation in the descending duodenum up to 3.2 cm to the right of the aorta, with duodenum and jejunum decompressed left of the aorta. Small bowel is otherwise unremarkable with a normal appendix. There is air and fluid distention of the ascending and transverse colon with the cecum up to 10.3 cm in diameter but no wall thickening. No other colonic dilatation is seen. There is fluid throughout the colon. No obstructing mass is visible. Vascular/Lymphatic: Aortic atherosclerosis. No enlarged abdominal or pelvic lymph nodes. Reproductive: The uterus is absent. No adnexal lesion is seen. Multiple pelvic phleboliths. Other: There is no free air, hemorrhage, fluid or bowel pneumatosis. Musculoskeletal: Osteopenia and compression fractures again noted at T12, L2, L3, L4 and L5, unchanged compared with lumbar spine MRI 07/15/2021 with kyphoplasty cement again seen at L2 and 5. There are degenerative changes of the lumbar spine, old healed left inferior pubic ramus fracture. IMPRESSION: 1. Bilateral lobar, segmental and subsegmental pulmonary arterial emboli, some of which are occlusive, with overall moderate clot burden and mild features of right heart strain. 2. Aortic atherosclerosis. 3. Cardiomegaly with scattered calcification LAD coronary artery. 4. Interstitial haziness medially in the right upper lobe which could relate to low lung volumes or  pneumonitis. 5. Portal venous gas in the liver, unknown etiology. This may be seen with necrotic bowel but there are no thickened segments or segments showing pneumatosis. There is a distended stomach and proximal duodenum with air and fluid which could be due to an ileus or intermittent SMA compression syndrome. There is also mild dilatation and fluid filling in the ascending colon without a visible obstructing mass or small bowel dilatation in the remainder of the abdomen and pelvis. Adynamic ileus may be present with the portal venous gas related to dilatation in the stomach and/or ascending colon. Fluid is seen throughout the colon without wall thickening or inflammation. Close clinical follow-up and laboratory correlation recommended. 6. Osteopenia with lumbar and T12 compression fractures similar in appearance to an MRI of 07/15/2021. 7. Results phoned to Dr. Darl [facility]holder at 1:01 a.m., 08/07/2021. Electronically Signed   By: Telford Nab M.D.   On: 08/07/2021 01:09   DG Chest Port 1 View  Result Date: 08/06/2021 CLINICAL DATA:  Sepsis workup. EXAM: PORTABLE CHEST 1 VIEW COMPARISON:  Portable chest 07/27/2021 FINDINGS: The patient is rotated to the left and the lungs are expiratory with obscured lung bases. The visualized lungs are generally clear. No pleural effusion is seen. There is mild cardiomegaly, mild aortic atherosclerosis and tortuosity. There is prominence of the central vessels which could relate to low lung volumes or mild perihilar vascular congestion without evidence of acute edema. Osteopenia mild thoracic dextroscoliosis. There is moderate aeration of the flexures of the colon incidentally noted. IMPRESSION: Expiratory with obscured lung bases. The visualized hypoinflated lungs are generally clear. There could be mild perihilar vascular congestion or exaggerated central vasculature due to expiration. Electronically Signed   By: Telford Nab M.D.   On: 08/06/2021 23:15     EKG: Independently  reviewed, with result as described above.    Assessment/Plan   Principal Problem:   Acute pulmonary embolism (HCC) Active Problems:   Abdominal pain   Elevated troponin   Lactic acidosis   Seizure disorder (HCC)   GERD (gastroesophageal reflux disease)     #) Acute bilateral pulmonary emboli: With associated evidence of cor pulmonale.  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  No evidence of associated hypotension to warrant tPA administration.  Started on heparin drip in the ED.  Of note, no evidence of additional epistaxis since time of IR embolization on July 18, 2021.  Per presenting labs, hemoglobin and platelets stable, as quantified above.    Plan: Continue heparin drip.  Echo ordered for the morning.  Mrs. Rountree.  Monitor on telemetry.  Monitor continuous pulse oximetry.  Continue continuous IV fluids, but reduce rate from 150 cc/h to 100 cc/h.  Monitor strict I's and O's Daily weights.  Repeat CBC in the morning.  Check INR.      #) Acute hypoxic respiratory distress: In setting of no known baseline supplemental oxygen requirements, with patient having been weaned to room air over the course of most recent prior hospitalization, her initial O2 sats are noted to be in the mid to high 80s on room air, with ensuing improvement to 94 to 95% on 2 L nasal cannula.  This appears to be in setting of presenting acute bilateral Neri emboli.  No clinical or radiographic evidence to suggest acutely causing heart failure, but will check BNP as well as echocardiogram.  No overt evidence of  pneumonia, but will check procalcitonin as well.  COVID-19/influenza PCR found to be negative.  Will initially thought to be septic, it appears that the patient's fever may well be from acute line lateral pulmonary emboli with moderate clot burden while leukocytosis suspected to be inflammatory/reactive in the setting of acute bilateral pulmonary emboli as well as steroid taper upon which patient was short from most recent prior hospitalization.  ACS is felt to be less likely, with mildly elevated troponin suspected to be in the basis of type II supply demand mismatch as a consequence of acute hypoxic respiratory failure as well as acute bilateral pulmonary emboli proper.  No evidence of associated chest pain, will EKG shows evidence of acute ischemic changes.  Plan: Further evaluation management of acute bilateral pulmonary emboli, bleeding heparin drip.  Echocardiogram.  Monitor telemetry.  Monitor continuous pulse oximetry.  Add on BNP.  Echo in the morning.  Continue to trend troponin.  Add on procalcitonin.  Refrain from additional antibiotics at this time.      #) abdominal pain: Generalized abdominal tenderness starting on the afternoon of 08/06/2021 following receipt of 2 doses of Lomotil as management for 2 episodes of loose stool occurring on the morning of 08/06/2021, with CT abdomen/pelvis showing multiple nonspecific findings, with radiology, I discussed case with EDP, giving the possibility of ileus in the absence of overt obstruction, no evidence of bowel perforation or abscess.  Physical exam reveals no evidence of acute peritoneal signs.   Plan: We will keep n.p.o. for the rest of the evening, while providing IV fluids, and reassess abdominal exam and bowel status in the morning, with the possibility of pursuing small bowel follow-through for diagnostic/therapeutic purposes.  Prn IV fentanyl.       #) Asymptomatic pyuria: Presenting urinalysis in the absence of any acute urinary  symptoms, shows 11-20 white blood cells, which, in a female, does not meet quantitative threshold for significance.  Therefore, this presentation appears consistent with asymptomatic pyuria of insignificant quantity.  In the absence  of sepsis given that fever/lactic acidosis are felt to be on the basis of acute bilateral pulmonary emboli as well as contribution from recent steroid taper, will refrain from  Plan: Repeat CBC with differential in the morning. continuation of IV antibiotics at this time.      #) Lactic acidosis: Mildly elevated initial lactate of 2.0.  Suspect this is in the basis of generalized tissue hypoperfusion as concerns of presenting acute hypoxic respiratory distress resulting in diminished oxygen delivery capacity.  As noted above, mentation will to be less likely consistent with sepsis.  She has been receiving continuous LR at 150 cc/h.    Plan: Will reduce rate to 100 cc/h and repeat lactic acid level.  Repeat CMP in the morning.       #) Seizure disorder: Documented history of such, on Keppra 250 mg p.o. twice daily.  Setting of current n.p.o. status, pending further evaluation of ileus, hold oral Keppra, and initiate IV equivalent dose of this antiepileptic medication.  Plan: Transition oral Keppra to IV equivalent in the setting of current n.p.o. status.      #) GERD: Documented history of such, on oral Protonix as an outpatient.  Plan: In the setting of current n.p.o. status, will pursue IV route of administration from Protonix for now.     DVT prophylaxis: Heparin drip Code Status: DNR/DNI; documentation from his recent transposition, including palliative care consultation with good status assessment 07/29/2021. Family Communication: Case discussed with patient's daughter, is present at bedside. Disposition Plan: Per Rounding Team Consults called: none;  Admission status: Inpatient; PCU  Warrants inpatient status on basis of here for further  evaluation management of acute bilateral pulmonary emboli with evidence of cor pulmonale consistent with at least moderate clot burden, including need for IV anticoagulation, and further evaluation of presenting acute proximal respiratory distress, including pursuit of echocardiogram in addition to evaluation and management of presenting discomfort.   PLEASE NOTE THAT DRAGON DICTATION SOFTWARE WAS USED IN THE CONSTRUCTION OF THIS NOTE.   [place] DO [facility]  [place]   08/07/2021, 3:00 AM

## 2021-08-07 NOTE — Progress Notes (Signed)
BLE venous duplex has been completed.  Preliminary findings have been given to Curt Bears, Therapist, sports.   Results can be found under chart review under CV PROC. 08/07/2021 1:13 PM [place] Nordquist RVT, RDMS

## 2021-08-07 NOTE — Progress Notes (Signed)
Pharmacy Antibiotic Note  Katelyn Lamb is a 75 y.o. female admitted on 08/06/2021 with sepsis.  Pharmacy has been consulted for Vancomycin/Aztreonam dosing. Pt having shortness of breath, noted to have PE on CT. Xarelto was recently held due to severe nosebleeds. WBC is elevated at 24.4. Renal function good.   Plan: Vancomycin 1500 mg IV q24h >>>Estimated AUC: 468 Aztreonam 1g IV q8h Trend WBC, temp, renal function  F/U infectious work-up Drug levels as indicated   Temp (24hrs), Avg:99.8 F (37.7 C), Min:98.1 F (36.7 C), Max:101.4 F (38.6 C)  Recent Labs  Lab 08/06/21 2225 08/06/21 2300 08/06/21 2330  WBC 24.4*  --   --   CREATININE 0.74  --  0.80  LATICACIDVEN  --  2.0*  --     Estimated Creatinine Clearance: 68.6 mL/min (by C-G formula based on SCr of 0.8 mg/dL).    Allergies  Allergen Reactions   Demerol [Meperidine] Other (See Comments)    Hallucinations   Percocet [Oxycodone-Acetaminophen] Itching   Amoxicillin-Pot Clavulanate Diarrhea    Severe pain, headache, intestinal infection   Penicillins Itching and Rash    Tolerated amoxicillin November 2022  Has patient had a PCN reaction causing immediate rash, facial/tongue/throat swelling, SOB or lightheadedness with hypotension:  NO Has patient had a PCN reaction causing severe rash involving mucus membranes or skin necrosis: No Has patient had a PCN reaction that required hospitalization: No Has patient had a PCN reaction occurring within the last 10 years: Yes If all of the above answers are "NO", then may proceed with Cephalosporin use.    [name], PharmD, BCPS Clinical Pharmacist Phone: [phone]

## 2021-08-08 DIAGNOSIS — J9601 Acute respiratory failure with hypoxia: Secondary | ICD-10-CM

## 2021-08-08 LAB — CBC
HCT: 32.1 % — ABNORMAL LOW (ref 36.0–46.0)
Hemoglobin: 9.7 g/dL — ABNORMAL LOW (ref 12.0–15.0)
MCH: 29.1 pg (ref 26.0–34.0)
MCHC: 30.2 g/dL (ref 30.0–36.0)
MCV: 96.4 fL (ref 80.0–100.0)
Platelets: 200 10*3/uL (ref 150–400)
RBC: 3.33 MIL/uL — ABNORMAL LOW (ref 3.87–5.11)
RDW: 16.9 % — ABNORMAL HIGH (ref 11.5–15.5)
WBC: 14.6 10*3/uL — ABNORMAL HIGH (ref 4.0–10.5)
nRBC: 0 % (ref 0.0–0.2)

## 2021-08-08 LAB — BASIC METABOLIC PANEL
Anion gap: 11 (ref 5–15)
BUN: 12 mg/dL (ref 8–23)
CO2: 23 mmol/L (ref 22–32)
Calcium: 8.2 mg/dL — ABNORMAL LOW (ref 8.9–10.3)
Chloride: 101 mmol/L (ref 98–111)
Creatinine, Ser: 0.66 mg/dL (ref 0.44–1.00)
GFR, Estimated: >60 mL/min (ref >60)
Glucose, Bld: 82 mg/dL (ref 70–99)
Potassium: 3.8 mmol/L (ref 3.5–5.1)
Sodium: 135 mmol/L (ref 135–145)

## 2021-08-08 LAB — MAGNESIUM: Magnesium: 1.5 mg/dL — ABNORMAL LOW (ref 1.7–2.4)

## 2021-08-08 LAB — HEPARIN LEVEL (UNFRACTIONATED): Heparin Unfractionated: 0.39 IU/mL (ref 0.30–0.70)

## 2021-08-08 MED ORDER — MAGNESIUM SULFATE 2 GM/50ML IV SOLN
2.0000 g | Freq: Once | INTRAVENOUS | Status: AC
  Administered 2021-08-08: 2 g via INTRAVENOUS
  Filled 2021-08-08: qty 50

## 2021-08-08 MED ORDER — LIP MEDEX EX OINT
1.0000 "application " | TOPICAL_OINTMENT | CUTANEOUS | Status: DC | PRN
  Filled 2021-08-08: qty 7

## 2021-08-08 MED ORDER — PREDNISONE 5 MG PO TABS
5.0000 mg | ORAL_TABLET | Freq: Every day | ORAL | Status: DC
  Administered 2021-08-08 – 2021-08-15 (×8): 5 mg via ORAL
  Filled 2021-08-08 (×8): qty 1

## 2021-08-08 NOTE — Progress Notes (Signed)
MD aware HR is slowly rising to 120's; EKG ordered and done (sinus tach).

## 2021-08-08 NOTE — Progress Notes (Addendum)
Pt had brief small nose bleed. Sterile water humidifier applied to nasal canula O2. Bleeding has since stopped and no other signs of bleeding. Will continue to monitor.

## 2021-08-08 NOTE — Progress Notes (Signed)
   08/07/21 2030  Assess: MEWS Score  Temp 98.1 F (36.7 C)  BP 125/86  Pulse Rate (!) 107  ECG Heart Rate (!) 108  Resp (!) 29  Level of Consciousness Alert  SpO2 93 %  O2 Device Nasal Cannula  O2 Flow Rate (L/min) 4 L/min  Assess: MEWS Score  MEWS Temp 0  MEWS Systolic 0  MEWS Pulse 1  MEWS RR 2  MEWS LOC 0  MEWS Score 3  MEWS Score Color Yellow  Assess: if the MEWS score is Yellow or Red  Were vital signs taken at a resting state? Yes  Focused Assessment No change from prior assessment  Early Detection of Sepsis Score *See Row Information* Low  MEWS guidelines implemented *See Row Information* Yes  Treat  MEWS Interventions Administered scheduled meds/treatments;Other (Comment) (MD notified of home meds)  Pain Scale 0-10  Pain Score 0  Take Vital Signs  Increase Vital Sign Frequency  Yellow: Q 2hr X 2 then Q 4hr X 2, if remains yellow, continue Q 4hrs  Escalate  MEWS: Escalate Yellow: discuss with charge nurse/RN and consider discussing with provider and RRT  Notify: Charge Nurse/RN  Name of Charge Nurse/RN Notified NIcole RN  Date Charge Nurse/RN Notified 08/07/21  Time Charge Nurse/RN Notified 2045  Notify: Provider  Provider Name/Title Dr. Tonie Griffith  Date Provider Notified 08/07/21  Time Provider Notified 2050  Notification Type Page (secure chat)  Notification Reason Other (Comment) (Increased HR and increased RR)  Provider response No new orders  Date of Provider Response 08/07/21  Time of Provider Response 2055

## 2021-08-08 NOTE — Evaluation (Signed)
Physical Therapy Evaluation Patient Details Name: Katelyn Lamb MRN: [id number] DOB: 1946/05/21 Today's Date: 08/08/2021  History of Present Illness  75 y.o. female who is admitted to [facility] on 08/06/2021 with acute bilateral pulmonary emboli with suggestion of cor pulmonale after presenting to [facility] ED complaining of shortness of breath. Pt recently on Eliquis for LE DVT but was on hold due to epistaxis requiring cauterization to stop bleeding. 08/07/21 +RLE DVT  PMH- significant for prior DVT, seizure disorder, GERD  Clinical Impression   Pt admitted secondary to problem above with deficits below. PTA patient was briefly at SNF for therapies due to dependence with all functional mobility. When last hospitalized was requiring +2 max assist with stedy lift to stand.  Pt currently requires max to total assist for bed mobility and unable to progress to sitting EOB with 1 person assist. She remains very weak and can benefit from continued therapies to regain function with goal to ultimately return home.  Anticipate patient will benefit from PT to address problems listed below.Will continue to follow acutely to maximize functional mobility independence and safety.          Recommendations for follow up therapy are one component of a multi-disciplinary discharge planning process, led by the attending physician.  Recommendations may be updated based on patient status, additional functional criteria and insurance authorization.  Follow Up Recommendations Skilled nursing-short term rehab (<3 hours/day)    Assistance Recommended at Discharge Frequent or constant Supervision/Assistance  Functional Status Assessment Patient has had a recent decline in their functional status and/or demonstrates limited ability to make significant improvements in function in a reasonable and predictable amount of time  Equipment Recommendations       Recommendations for Other Services       Precautions /  Restrictions Precautions Precautions: Fall Precaution Comments: suprapubic catheter      Mobility  Bed Mobility Overal bed mobility: Needs Assistance Bed Mobility: Rolling Rolling: Max assist;Total assist         General bed mobility comments: rolling to rt total assist; to left max assist    Transfers                        Ambulation/Gait                  Stairs            Wheelchair Mobility    Modified Rankin (Stroke Patients Only)       Balance                                             Pertinent Vitals/Pain Pain Assessment: Faces Faces Pain Scale: Hurts a little bit Pain Location: "all over" Pain Descriptors / Indicators: Discomfort Pain Intervention(s): Limited activity within patient's tolerance;Monitored during session    Home Living Family/patient expects to be discharged to:: Skilled nursing facility                   Additional Comments: pt recently discharged to SNF with plan to return    Prior Function Prior Level of Function : Needs assist             Mobility Comments: prior to last hospitalization, pt was requiring assist for all transfers and limited ambulation; while last hospitalized, pt required +2 max to stand with  stedy lift ADLs Comments: Able to self feed with use of built up handles, requires assist for other ADL's     Hand Dominance   Dominant Hand: Right    Extremity/Trunk Assessment   Upper Extremity Assessment Upper Extremity Assessment: Generalized weakness;RUE deficits/detail;LUE deficits/detail RUE Deficits / Details: AROM to @ 120 shoulder FF; other AROM overall WFL; Strength 3+/5 elbow/wrist/hand; 2+/5 shoulders LUE Deficits / Details: AROM shoulder 90 degrees; elbow WFL; elbow strength 3+, shoulder 3-    Lower Extremity Assessment Lower Extremity Assessment: Generalized weakness;RLE deficits/detail;LLE deficits/detail RLE Deficits / Details: ROM WFL except  ankle DF to neutral; hip flexion 3, extension 3+; knee extension 3+ LLE Deficits / Details: ROM WFL except ankle DF to neutral; hip flexion 3, extension 3+; knee extension 3+    Cervical / Trunk Assessment Cervical / Trunk Assessment: Kyphotic  Communication   Communication: No difficulties  Cognition Arousal/Alertness: Awake/alert Behavior During Therapy: WFL for tasks assessed/performed Overall Cognitive Status: No family/caregiver present to determine baseline cognitive functioning                                 General Comments: slow processing; at times requiring tactile cues to understand request made of her        General Comments General comments (skin integrity, edema, etc.): VSS during session; will need +2 assist to attempt EOB    Exercises     Assessment/Plan    PT Assessment Patient needs continued PT services  PT Problem List Decreased strength;Decreased range of motion;Decreased activity tolerance;Decreased balance;Decreased mobility;Decreased cognition;Decreased knowledge of use of DME;Pain       PT Treatment Interventions DME instruction;Gait training;Functional mobility training;Therapeutic activities;Therapeutic exercise;Balance training;Patient/family education    PT Goals (Current goals can be found in the Care Plan section)  Acute Rehab PT Goals Patient Stated Goal: get stronger PT Goal Formulation: With patient Time For Goal Achievement: 08/22/21 Potential to Achieve Goals: Fair    Frequency Min 2X/week   Barriers to discharge Decreased caregiver support      Co-evaluation               AM-PAC PT "6 Clicks" Mobility  Outcome Measure Help needed turning from your back to your side while in a flat bed without using bedrails?: Total Help needed moving from lying on your back to sitting on the side of a flat bed without using bedrails?: Total Help needed moving to and from a bed to a chair (including a wheelchair)?:  Total Help needed standing up from a chair using your arms (e.g., wheelchair or bedside chair)?: Total Help needed to walk in hospital room?: Total Help needed climbing 3-5 steps with a railing? : Total 6 Click Score: 6    End of Session   Activity Tolerance: Patient limited by fatigue Patient left: in bed;with call bell/phone within reach;with bed alarm set Nurse Communication: Mobility status;Need for lift equipment PT Visit Diagnosis: Muscle weakness (generalized) (M62.81);Difficulty in walking, not elsewhere classified (R26.2)    Time: 9030-0923 PT Time Calculation (min) (ACUTE ONLY): 14 min   Charges:   PT Evaluation $PT Eval Low Complexity: [place], PT Acute Rehabilitation Services  Pager [phone] Office [phone]   Rexanne Mano 08/08/2021, 1:49 PM

## 2021-08-08 NOTE — Progress Notes (Signed)
Patient ID: Katelyn Lamb, female   DOB: 1945-11-20, 75 y.o.   MRN: [id number]  PROGRESS NOTE    Katelyn Lamb  XKP:[id number] DOB: 03-01-1946 DOA: 08/06/2021 PCP: Katelyn Amel, MD   Brief Narrative:  75 y.o. female with medical history significant for prior DVT, seizure disorder, GERD, encephalomyelitis and transverse myelitis, HSV, neurogenic bladder with suprapubic catheter, incomplete paraplegia, recurrent UTIs with recent hospitalization from 07/18/2021-08/02/2021 for epistaxis requiring intubation and embolization by IR and Xarelto has remained on hold since then.  She presented with worsening shortness of breath.  On presentation, she was saturating 86 to 88% on room air which improved with supplemental oxygen.  WBC was 24.4; COVID-19 and influenza testing were negative. x-ray showed potential mild perihilar vascular congestion, but otherwise showed no evidence of acute cardiopulmonary process.  CTA chest showed bilateral lobar pulmonary emboli, some of which are occlusive, with moderate clot burden, and evidence of right heart strain, also showing interstitial haziness medially within the right upper lobe, consistent with low lung volumes, per radiology.  She was started on heparin drip.  Subsequently PCCM was consulted.  Assessment & Plan:   Acute bilateral pulmonary embolism Acute DVT involving the right posterior tibial veins -Patient has been off of Xarelto for almost 3 weeks because of her recent hospitalization for epistaxis requiring embolization -Presented with worsening shortness of breath and was found to have acute bilateral PE with mild right heart strain on CTA chest and right posterior tibial DVT. -Echo showed EF of 65 to 70%. -PCCM consultation appreciated: Recommended 48 hours of IV heparin and monitor for signs of bleeding.  If no signs of bleeding, then switch to oral anticoagulation.  If signs of bleeding: Might need IVC filter.  PCCM signed off on 08/07/2021. -Continue heparin  drip for today.  Acute hypoxic respiratory failure -Possibly from above.  Currently requiring 4 L oxygen via nasal cannula.  Wean off as able.  COVID-19/influenza testing negative on presentation  Abdominal pain -Nonspecific.  X-ray of abdomen showed moderate dilation of the large bowel in the right hemiabdomen. -Husband states that patient was having a lot of bowel movements prior to presentation.  We will start patient on a diet and monitor response.  Leukocytosis -Possibly reactive.  Improving  Pyuria -Patient has indwelling suprapubic catheter and is possibly colonized with bacteria.  Will not start antibiotics.  Monitor.  Seizure disorder -Continue Keppra IV.  Switch to oral once able to take orally  GERD -Continue Protonix  History of encephalomyelitis/transverse myelitis/incomplete paraplegia Neurogenic bladder with suprapubic catheter Generalized deconditioning -Fall precautions.  PT eval. -Had recent palliative care evaluation.  Outpatient follow-up with palliative care   DVT prophylaxis: Heparin drip Code Status: DNR Family Communication: Husband at bedside Disposition Plan: Status is: Inpatient  Remains inpatient appropriate because: Of need for heparin drip  Consultants: PCCM  Procedures: Echo  Antimicrobials:  Anti-infectives (From admission, onward)    Start     Dose/Rate Route Frequency Ordered Stop   08/07/21 2330  acyclovir (ZOVIRAX) tablet 400 mg        400 mg Oral 2 times daily 08/07/21 2233     08/07/21 2200  vancomycin (VANCOREADY) IVPB 1500 mg/300 mL  Status:  Discontinued        1,500 mg 150 mL/hr over 120 Minutes Intravenous Every 24 hours 08/07/21 0128 08/07/21 0546   08/07/21 0600  aztreonam (AZACTAM) 1 g in sodium chloride 0.9 % 100 mL IVPB  Status:  Discontinued  1 g 200 mL/hr over 30 Minutes Intravenous Every 8 hours 08/07/21 0128 08/07/21 0546   08/06/21 2300  vancomycin (VANCOREADY) IVPB 1500 mg/300 mL        1,500 mg 150  mL/hr over 120 Minutes Intravenous  Once 08/06/21 2227 08/07/21 0143   08/06/21 2230  aztreonam (AZACTAM) 2 g in sodium chloride 0.9 % 100 mL IVPB        2 g 200 mL/hr over 30 Minutes Intravenous  Once 08/06/21 2227 08/06/21 2329   08/06/21 2230  metroNIDAZOLE (FLAGYL) IVPB 500 mg        500 mg 100 mL/hr over 60 Minutes Intravenous  Once 08/06/21 2227 08/07/21 0029        Subjective: Patient seen and examined at bedside.  Poor historian.  Denies chest pain, nausea or vomiting.  Slightly short of breath with some exertion.  Objective: Vitals:   08/07/21 2230 08/08/21 0030 08/08/21 0405 08/08/21 0447  BP: 103/75 115/81 96/70 99/71   Pulse: (!) 105 (!) 110 (!) 110 (!) 109  Resp: 17 (!) 25 (!) 21 (!) 21  Temp: 98.4 F (36.9 C) 98.4 F (36.9 C) 99.3 F (37.4 C) 99.2 F (37.3 C)  TempSrc: Oral Oral Axillary   SpO2: 96% 91% 95% 95%  Weight:      Height:        Intake/Output Summary (Last 24 hours) at 08/08/2021 0802 Last data filed at 08/08/2021 0409 Gross per 24 hour  Intake 473.8 ml  Output 1625 ml  Net -1151.2 ml   Filed Weights   08/07/21 1200  Weight: 81 kg    Examination:  General exam: Appears calm and comfortable.  Looks chronically ill and deconditioned.  Currently on 4 L oxygen via nasal. Respiratory system: Bilateral decreased breath sounds at bases with some scattered crackles and intermittently tachypneic Cardiovascular system: S1 & S2 heard; tachycardic intermittently  gastrointestinal system: Abdomen is slightly distended mildly tender., soft and nontender.  Bowel sounds are sluggish. Extremities: No cyanosis, clubbing; trace lower extremity edema Central nervous system: Awake, extremely slow to respond.  Poor historian.   Skin: No rashes, lesions or ulcers Psychiatry: Affect is extremely flat.  Does not participate in conversation much.   Data Reviewed: I have personally reviewed following labs and imaging studies  CBC: Recent Labs  Lab 08/06/21 2225  08/06/21 2330 08/07/21 0740 08/08/21 0038  WBC 24.4*  --  20.2* 14.6*  NEUTROABS 18.2*  --   --   --   HGB 12.9 14.6 10.5* 9.7*  HCT 42.2 43.0 34.9* 32.1*  MCV 98.8  --  97.8 96.4  PLT 292  --  237 503   Basic Metabolic Panel: Recent Labs  Lab 08/06/21 2225 08/06/21 2330 08/07/21 0500 08/08/21 0038  NA 136 135 132* 135  K 4.2 4.1 3.8 3.8  CL 102 106 100 101  CO2 20*  --  23 23  GLUCOSE 136* 132* 112* 82  BUN 27* 33* 21 12  CREATININE 0.74 0.80 0.67 0.66  CALCIUM 9.5  --  8.8* 8.2*  MG  --   --  1.6* 1.5*  PHOS  --   --  3.9  --    GFR: Estimated Creatinine Clearance: 68.6 mL/min (by C-G formula based on SCr of 0.66 mg/dL). Liver Function Tests: Recent Labs  Lab 08/06/21 2225 08/07/21 0500  AST 28 25  ALT 40 33  ALKPHOS 109 90  BILITOT 0.8 0.9  PROT 6.9 5.9*  ALBUMIN 3.5 2.9*   No  results for input(s): LIPASE, AMYLASE in the last 168 hours. No results for input(s): AMMONIA in the last 168 hours. Coagulation Profile: Recent Labs  Lab 08/06/21 2225  INR 1.0   Cardiac Enzymes: No results for input(s): CKTOTAL, CKMB, CKMBINDEX, TROPONINI in the last 168 hours. BNP (last 3 results) No results for input(s): PROBNP in the last 8760 hours. HbA1C: No results for input(s): HGBA1C in the last 72 hours. CBG: No results for input(s): GLUCAP in the last 168 hours. Lipid Profile: No results for input(s): CHOL, HDL, LDLCALC, TRIG, CHOLHDL, LDLDIRECT in the last 72 hours. Thyroid Function Tests: No results for input(s): TSH, T4TOTAL, FREET4, T3FREE, THYROIDAB in the last 72 hours. Anemia Panel: No results for input(s): VITAMINB12, FOLATE, FERRITIN, TIBC, IRON, RETICCTPCT in the last 72 hours. Sepsis Labs: Recent Labs  Lab 08/06/21 2300 08/07/21 0556 08/07/21 0740  PROCALCITON  --   --  <0.10  LATICACIDVEN 2.0* 1.7  --     Recent Results (from the past 240 hour(s))  Resp Panel by RT-PCR (Flu A&B, Covid) Nasopharyngeal Swab     Status: None   Collection Time:  08/02/21 11:38 AM   Specimen: Nasopharyngeal Swab; Nasopharyngeal(NP) swabs in vial transport medium  Result Value Ref Range Status   SARS Coronavirus 2 by RT PCR NEGATIVE NEGATIVE Final    Comment: (NOTE) SARS-CoV-2 target nucleic acids are NOT DETECTED.  The SARS-CoV-2 RNA is generally detectable in upper respiratory specimens during the acute phase of infection. The lowest concentration of SARS-CoV-2 viral copies this assay can detect is 138 copies/mL. A negative result does not preclude SARS-Cov-2 infection and should not be used as the sole basis for treatment or other patient management decisions. A negative result may occur with  improper specimen collection/handling, submission of specimen other than nasopharyngeal swab, presence of viral mutation(s) within the areas targeted by this assay, and inadequate number of viral copies(<138 copies/mL). A negative result must be combined with clinical observations, patient history, and epidemiological information. The expected result is Negative.  Fact Sheet for Patients:  EntrepreneurPulse.com.au  Fact Sheet for Healthcare Providers:  IncredibleEmployment.be  This test is no t yet approved or cleared by the [place] FDA and  has been authorized for detection and/or diagnosis of SARS-CoV-2 by FDA under an Emergency Use Authorization (EUA). This EUA will remain  in effect (meaning this test can be used) for the duration of the COVID-19 declaration under Section 564(b)(1) of the Act, 21 U.S.C.section 360bbb-3(b)(1), unless the authorization is terminated  or revoked sooner.       Influenza A by PCR NEGATIVE NEGATIVE Final   Influenza B by PCR NEGATIVE NEGATIVE Final    Comment: (NOTE) The Xpert Xpress SARS-CoV-2/FLU/RSV plus assay is intended as an aid in the diagnosis of influenza from Nasopharyngeal swab specimens and should not be used as a sole basis for treatment. Nasal washings  and aspirates are unacceptable for Xpert Xpress SARS-CoV-2/FLU/RSV testing.  Fact Sheet for Patients: EntrepreneurPulse.com.au  Fact Sheet for Healthcare Providers: IncredibleEmployment.be  This test is not yet approved or cleared by the [place] FDA and has been authorized for detection and/or diagnosis of SARS-CoV-2 by FDA under an Emergency Use Authorization (EUA). This EUA will remain in effect (meaning this test can be used) for the duration of the COVID-19 declaration under Section 564(b)(1) of the Act, 21 U.S.C. section 360bbb-3(b)(1), unless the authorization is terminated or revoked.  Performed at [facility], [place] [address], [place], [place] [address]  Resp Panel by RT-PCR (Flu A&B, Covid) Nasopharyngeal Swab     Status: None   Collection Time: 08/06/21 10:25 PM   Specimen: Nasopharyngeal Swab; Nasopharyngeal(NP) swabs in vial transport medium  Result Value Ref Range Status   SARS Coronavirus 2 by RT PCR NEGATIVE NEGATIVE Final    Comment: (NOTE) SARS-CoV-2 target nucleic acids are NOT DETECTED.  The SARS-CoV-2 RNA is generally detectable in upper respiratory specimens during the acute phase of infection. The lowest concentration of SARS-CoV-2 viral copies this assay can detect is 138 copies/mL. A negative result does not preclude SARS-Cov-2 infection and should not be used as the sole basis for treatment or other patient management decisions. A negative result may occur with  improper specimen collection/handling, submission of specimen other than nasopharyngeal swab, presence of viral mutation(s) within the areas targeted by this assay, and inadequate number of viral copies(<138 copies/mL). A negative result must be combined with clinical observations, patient history, and epidemiological information. The expected result is Negative.  Fact Sheet for Patients:  EntrepreneurPulse.com.au  Fact Sheet  for Healthcare Providers:  IncredibleEmployment.be  This test is no t yet approved or cleared by the [place] FDA and  has been authorized for detection and/or diagnosis of SARS-CoV-2 by FDA under an Emergency Use Authorization (EUA). This EUA will remain  in effect (meaning this test can be used) for the duration of the COVID-19 declaration under Section 564(b)(1) of the Act, 21 U.S.C.section 360bbb-3(b)(1), unless the authorization is terminated  or revoked sooner.       Influenza A by PCR NEGATIVE NEGATIVE Final   Influenza B by PCR NEGATIVE NEGATIVE Final    Comment: (NOTE) The Xpert Xpress SARS-CoV-2/FLU/RSV plus assay is intended as an aid in the diagnosis of influenza from Nasopharyngeal swab specimens and should not be used as a sole basis for treatment. Nasal washings and aspirates are unacceptable for Xpert Xpress SARS-CoV-2/FLU/RSV testing.  Fact Sheet for Patients: EntrepreneurPulse.com.au  Fact Sheet for Healthcare Providers: IncredibleEmployment.be  This test is not yet approved or cleared by the [place] FDA and has been authorized for detection and/or diagnosis of SARS-CoV-2 by FDA under an Emergency Use Authorization (EUA). This EUA will remain in effect (meaning this test can be used) for the duration of the COVID-19 declaration under Section 564(b)(1) of the Act, 21 U.S.C. section 360bbb-3(b)(1), unless the authorization is terminated or revoked.  Performed at [facility], [place] [address], [place], [place] [address]   Blood Culture (routine x 2)     Status: None (Preliminary result)   Collection Time: 08/06/21 10:37 PM   Specimen: BLOOD LEFT HAND  Result Value Ref Range Status   Specimen Description BLOOD LEFT HAND  Final   Special Requests   Final    BOTTLES DRAWN AEROBIC AND ANAEROBIC Blood Culture results may not be optimal due to an inadequate volume of blood received in culture  bottles   Culture   Final    NO GROWTH < 12 HOURS Performed at [facility], [place] [address] [place], [place] [address]    Report Status PENDING  Incomplete  Urine Culture     Status: Abnormal (Preliminary result)   Collection Time: 08/06/21 10:41 PM   Specimen: In/Out Cath Urine  Result Value Ref Range Status   Specimen Description IN/OUT CATH URINE  Final   Special Requests NONE  Final   Culture (A)  Final    >=100,000 COLONIES/mL GRAM NEGATIVE RODS SUSCEPTIBILITIES TO FOLLOW Performed at  [facility], [place] [address] [place], [place] [address]    Report Status PENDING  Incomplete  Blood Culture (routine x 2)     Status: None (Preliminary result)   Collection Time: 08/06/21 11:00 PM   Specimen: BLOOD RIGHT ARM  Result Value Ref Range Status   Specimen Description BLOOD RIGHT ARM  Final   Special Requests   Final    BOTTLES DRAWN AEROBIC AND ANAEROBIC Blood Culture adequate volume   Culture   Final    NO GROWTH < 12 HOURS Performed at [facility], [place] [address] [place], [place] [address]    Report Status PENDING  Incomplete         Radiology Studies: DG Abd 1 View  Result Date: 08/07/2021 CLINICAL DATA:  Abdominal distention EXAM: ABDOMEN - 1 VIEW COMPARISON:  KUB 07/18/2021, same-day CT abdomen/pelvis FINDINGS: There is significant gaseous distention of the large bowel in the right hemiabdomen, similar to the same day CT abdomen/pelvis. There is no significant small bowel dilation. There is no definite free intraperitoneal air, suboptimally assessed. There is no abnormal soft tissue calcification. Post kyphoplasty changes are seen in the lumbar spine. IMPRESSION: Moderate dilation of the large bowel in the right hemiabdomen, similar to the same-day CT abdomen/pelvis. Findings may reflect ileus. Electronically Signed   By: Valetta Mole M.D.   On: 08/07/2021 10:14   CT Angio Chest PE W and/or Wo Contrast  Result Date: 08/07/2021 CLINICAL DATA:  High  probability of pulmonary thromboemboli. Abdominal distention also. EXAM: CT ANGIOGRAPHY CHEST CT ABDOMEN AND PELVIS WITH CONTRAST TECHNIQUE: Multidetector CT imaging of the chest was performed using the standard protocol during bolus administration of intravenous contrast. Multiplanar CT image reconstructions and MIPs were obtained to evaluate the vascular anatomy. Multidetector CT imaging of the abdomen and pelvis was performed using the standard protocol during bolus administration of intravenous contrast. CONTRAST:  167mL OMNIPAQUE IOHEXOL 350 MG/ML SOLN COMPARISON:  None. CTA chest 02/12/2020 and 07/23/2019, portable chest today and 07/27/2021, CT abdomen and pelvis without contrast 04/14/2021, CT abdomen pelvis with contrast 11/22/2015. FINDINGS: CTA CHEST FINDINGS Cardiovascular: Prominent pulmonary trunk 3.1 cm indicating arterial hypertension, with slightly prominent right heart chambers and IVC reflux compatible with right heart strain. There are bilateral nonocclusive emboli in the distal main pulmonary arteries with near-occlusive thrombus in the interlobar artery and lateral and medial segmental right middle lobe arteries. There is occluding thrombus in the right upper lobe apical segmental and subsegmental arteries, nonocclusive thrombus in the right upper lobe anterior segmental artery. There are multiple segmental and subsegmental right lower lobe arteries with nonocclusive thrombus, and additional nonoccluding thrombus in the left upper lobe anterior segmental artery and the left lower lobe main and 2 segmental arteries to the posterior and medial left base. There is moderate overall embolic burden which appears acute. There is mild cardiomegaly with slight right chamber predominance, trace calcification LAD coronary artery, small amount of scattered aortic calcification and mild aortic ectasia and tortuosity. There is no aortic aneurysm or dissection, with normal great vessel branching and  opacification. There is no venous dilatation no pericardial effusion. Mediastinum/Nodes: No enlarged mediastinal, hilar, or axillary lymph nodes. Thyroid gland, trachea, and esophagus demonstrate no significant findings. Lungs/Pleura: No pleural effusion or pneumothorax. Lung bases show posterior atelectasis. There is interstitial haziness in the medial base of the right upper lobe which could be due to low lung volumes and micro atelectasis or pneumonitis. There are small caliber central airways and  trachea which could indicate bronchospasm or be due to expiration. Remaining lungs are clear. Musculoskeletal: Osteopenia and degenerative change with mild dextroscoliosis thoracic spine. No concerning regional skeletal lesion. Review of the MIP images confirms the above findings. CT ABDOMEN and PELVIS FINDINGS Factors affecting image quality in the abdomen: Respiratory motion artifact. Hepatobiliary: There is no mass enhancement, but there is portal venous gas in the left hepatic lobe near the dome and along the inter segmental division but not in the main portal vein. Etiology is unclear. Gallbladder and bile ducts unremarkable. Pancreas: Mildly prominent pancreatic duct which has been seen on prior studies. No mass enhancement or interval change. Spleen: Normal Adrenals/Urinary Tract: Unremarkable adrenal glands and renal cortex. No calculus or hydronephrosis. The bladder is contracted around a suprapubic Foley catheter and not well seen. Stomach/Bowel: The stomach is moderately distended with air and fluid. There is dilatation in the descending duodenum up to 3.2 cm to the right of the aorta, with duodenum and jejunum decompressed left of the aorta. Small bowel is otherwise unremarkable with a normal appendix. There is air and fluid distention of the ascending and transverse colon with the cecum up to 10.3 cm in diameter but no wall thickening. No other colonic dilatation is seen. There is fluid throughout the  colon. No obstructing mass is visible. Vascular/Lymphatic: Aortic atherosclerosis. No enlarged abdominal or pelvic lymph nodes. Reproductive: The uterus is absent. No adnexal lesion is seen. Multiple pelvic phleboliths. Other: There is no free air, hemorrhage, fluid or bowel pneumatosis. Musculoskeletal: Osteopenia and compression fractures again noted at T12, L2, L3, L4 and L5, unchanged compared with lumbar spine MRI 07/15/2021 with kyphoplasty cement again seen at L2 and 5. There are degenerative changes of the lumbar spine, old healed left inferior pubic ramus fracture. IMPRESSION: 1. Bilateral lobar, segmental and subsegmental pulmonary arterial emboli, some of which are occlusive, with overall moderate clot burden and mild features of right heart strain. 2. Aortic atherosclerosis. 3. Cardiomegaly with scattered calcification LAD coronary artery. 4. Interstitial haziness medially in the right upper lobe which could relate to low lung volumes or pneumonitis. 5. Portal venous gas in the liver, unknown etiology. This may be seen with necrotic bowel but there are no thickened segments or segments showing pneumatosis. There is a distended stomach and proximal duodenum with air and fluid which could be due to an ileus or intermittent SMA compression syndrome. There is also mild dilatation and fluid filling in the ascending colon without a visible obstructing mass or small bowel dilatation in the remainder of the abdomen and pelvis. Adynamic ileus may be present with the portal venous gas related to dilatation in the stomach and/or ascending colon. Fluid is seen throughout the colon without wall thickening or inflammation. Close clinical follow-up and laboratory correlation recommended. 6. Osteopenia with lumbar and T12 compression fractures similar in appearance to an MRI of 07/15/2021. 7. Results phoned to Dr. Darl Householder at 1:01 a.m., 08/07/2021. Electronically Signed   By: Telford Nab M.D.   On: 08/07/2021 01:09   CT  ABDOMEN PELVIS W CONTRAST  Result Date: 08/07/2021 CLINICAL DATA:  High probability of pulmonary thromboemboli. Abdominal distention also. EXAM: CT ANGIOGRAPHY CHEST CT ABDOMEN AND PELVIS WITH CONTRAST TECHNIQUE: Multidetector CT imaging of the chest was performed using the standard protocol during bolus administration of intravenous contrast. Multiplanar CT image reconstructions and MIPs were obtained to evaluate the vascular anatomy. Multidetector CT imaging of the abdomen and pelvis was performed using the standard protocol during bolus administration  of intravenous contrast. CONTRAST:  141mL OMNIPAQUE IOHEXOL 350 MG/ML SOLN COMPARISON:  None. CTA chest 02/12/2020 and 07/23/2019, portable chest today and 07/27/2021, CT abdomen and pelvis without contrast 04/14/2021, CT abdomen pelvis with contrast 11/22/2015. FINDINGS: CTA CHEST FINDINGS Cardiovascular: Prominent pulmonary trunk 3.1 cm indicating arterial hypertension, with slightly prominent right heart chambers and IVC reflux compatible with right heart strain. There are bilateral nonocclusive emboli in the distal main pulmonary arteries with near-occlusive thrombus in the interlobar artery and lateral and medial segmental right middle lobe arteries. There is occluding thrombus in the right upper lobe apical segmental and subsegmental arteries, nonocclusive thrombus in the right upper lobe anterior segmental artery. There are multiple segmental and subsegmental right lower lobe arteries with nonocclusive thrombus, and additional nonoccluding thrombus in the left upper lobe anterior segmental artery and the left lower lobe main and 2 segmental arteries to the posterior and medial left base. There is moderate overall embolic burden which appears acute. There is mild cardiomegaly with slight right chamber predominance, trace calcification LAD coronary artery, small amount of scattered aortic calcification and mild aortic ectasia and tortuosity. There is no  aortic aneurysm or dissection, with normal great vessel branching and opacification. There is no venous dilatation no pericardial effusion. Mediastinum/Nodes: No enlarged mediastinal, hilar, or axillary lymph nodes. Thyroid gland, trachea, and esophagus demonstrate no significant findings. Lungs/Pleura: No pleural effusion or pneumothorax. Lung bases show posterior atelectasis. There is interstitial haziness in the medial base of the right upper lobe which could be due to low lung volumes and micro atelectasis or pneumonitis. There are small caliber central airways and trachea which could indicate bronchospasm or be due to expiration. Remaining lungs are clear. Musculoskeletal: Osteopenia and degenerative change with mild dextroscoliosis thoracic spine. No concerning regional skeletal lesion. Review of the MIP images confirms the above findings. CT ABDOMEN and PELVIS FINDINGS Factors affecting image quality in the abdomen: Respiratory motion artifact. Hepatobiliary: There is no mass enhancement, but there is portal venous gas in the left hepatic lobe near the dome and along the inter segmental division but not in the main portal vein. Etiology is unclear. Gallbladder and bile ducts unremarkable. Pancreas: Mildly prominent pancreatic duct which has been seen on prior studies. No mass enhancement or interval change. Spleen: Normal Adrenals/Urinary Tract: Unremarkable adrenal glands and renal cortex. No calculus or hydronephrosis. The bladder is contracted around a suprapubic Foley catheter and not well seen. Stomach/Bowel: The stomach is moderately distended with air and fluid. There is dilatation in the descending duodenum up to 3.2 cm to the right of the aorta, with duodenum and jejunum decompressed left of the aorta. Small bowel is otherwise unremarkable with a normal appendix. There is air and fluid distention of the ascending and transverse colon with the cecum up to 10.3 cm in diameter but no wall thickening. No  other colonic dilatation is seen. There is fluid throughout the colon. No obstructing mass is visible. Vascular/Lymphatic: Aortic atherosclerosis. No enlarged abdominal or pelvic lymph nodes. Reproductive: The uterus is absent. No adnexal lesion is seen. Multiple pelvic phleboliths. Other: There is no free air, hemorrhage, fluid or bowel pneumatosis. Musculoskeletal: Osteopenia and compression fractures again noted at T12, L2, L3, L4 and L5, unchanged compared with lumbar spine MRI 07/15/2021 with kyphoplasty cement again seen at L2 and 5. There are degenerative changes of the lumbar spine, old healed left inferior pubic ramus fracture. IMPRESSION: 1. Bilateral lobar, segmental and subsegmental pulmonary arterial emboli, some of which are occlusive, with overall  moderate clot burden and mild features of right heart strain. 2. Aortic atherosclerosis. 3. Cardiomegaly with scattered calcification LAD coronary artery. 4. Interstitial haziness medially in the right upper lobe which could relate to low lung volumes or pneumonitis. 5. Portal venous gas in the liver, unknown etiology. This may be seen with necrotic bowel but there are no thickened segments or segments showing pneumatosis. There is a distended stomach and proximal duodenum with air and fluid which could be due to an ileus or intermittent SMA compression syndrome. There is also mild dilatation and fluid filling in the ascending colon without a visible obstructing mass or small bowel dilatation in the remainder of the abdomen and pelvis. Adynamic ileus may be present with the portal venous gas related to dilatation in the stomach and/or ascending colon. Fluid is seen throughout the colon without wall thickening or inflammation. Close clinical follow-up and laboratory correlation recommended. 6. Osteopenia with lumbar and T12 compression fractures similar in appearance to an MRI of 07/15/2021. 7. Results phoned to Dr. Darl Householder at 1:01 a.m., 08/07/2021.  Electronically Signed   By: Telford Nab M.D.   On: 08/07/2021 01:09   DG Chest Port 1 View  Result Date: 08/06/2021 CLINICAL DATA:  Sepsis workup. EXAM: PORTABLE CHEST 1 VIEW COMPARISON:  Portable chest 07/27/2021 FINDINGS: The patient is rotated to the left and the lungs are expiratory with obscured lung bases. The visualized lungs are generally clear. No pleural effusion is seen. There is mild cardiomegaly, mild aortic atherosclerosis and tortuosity. There is prominence of the central vessels which could relate to low lung volumes or mild perihilar vascular congestion without evidence of acute edema. Osteopenia mild thoracic dextroscoliosis. There is moderate aeration of the flexures of the colon incidentally noted. IMPRESSION: Expiratory with obscured lung bases. The visualized hypoinflated lungs are generally clear. There could be mild perihilar vascular congestion or exaggerated central vasculature due to expiration. Electronically Signed   By: Telford Nab M.D.   On: 08/06/2021 23:15   ECHOCARDIOGRAM COMPLETE  Result Date: 08/07/2021    ECHOCARDIOGRAM REPORT   Patient Name:   ALLICIA CULLEY Date of Exam: 08/07/2021 Medical Rec #:  [id number]    Height:       67.0 in Accession #:    [id number]   Weight:       178.6 lb Date of Birth:  Jan 26, 1946     BSA:          1.927 m Patient Age:    48 years     BP:           105/66 mmHg Patient Gender: F            HR:           102 bpm. Exam Location:  Inpatient Procedure: 2D Echo, Cardiac Doppler, Color Doppler and Intracardiac            Opacification Agent Indications:    Pulmonary Emboli  History:        Patient has prior history of Echocardiogram examinations, most                 recent 02/12/2020.  Sonographer:    Glo Herring Referring Phys: [id number] Rhetta Mura  Sonographer Comments: Technically difficult apical windows IMPRESSIONS  1. Left ventricular ejection fraction, by estimation, is 65 to 70%. The left ventricle has normal function. The left  ventricle has no regional wall motion abnormalities. There is mild asymmetric left ventricular hypertrophy of the basal-septal segment. Left  ventricular diastolic parameters are indeterminate.  2. Right ventricular systolic function is mildly reduced. Free wall hypokinesis. The right ventricular size is mildly enlarged. There is mildly elevated pulmonary artery systolic pressure. The estimated right ventricular systolic pressure is 26.3 mmHg.  3. The mitral valve is normal in structure. No evidence of mitral valve regurgitation. No evidence of mitral stenosis.  4. Tricuspid valve regurgitation is mild to moderate.  5. The aortic valve is tricuspid. Aortic valve regurgitation is mild. Aortic valve sclerosis/calcification is present, without any evidence of aortic stenosis.  6. The inferior vena cava is normal in size with greater than 50% respiratory variability, suggesting right atrial pressure of 3 mmHg. FINDINGS  Left Ventricle: Left ventricular ejection fraction, by estimation, is 65 to 70%. The left ventricle has normal function. The left ventricle has no regional wall motion abnormalities. Definity contrast agent was given IV to delineate the left ventricular  endocardial borders. The left ventricular internal cavity size was small. There is mild asymmetric left ventricular hypertrophy of the basal-septal segment. Left ventricular diastolic parameters are indeterminate. Right Ventricle: The right ventricular size is mildly enlarged. Right vetricular wall thickness was not well visualized. Right ventricular systolic function is mildly reduced. There is mildly elevated pulmonary artery systolic pressure. The tricuspid regurgitant velocity is 2.92 m/s, and with an assumed right atrial pressure of 3 mmHg, the estimated right ventricular systolic pressure is 33.5 mmHg. Left Atrium: Left atrial size was normal in size. Right Atrium: Right atrial size was normal in size. Pericardium: There is no evidence of  pericardial effusion. Mitral Valve: The mitral valve is normal in structure. No evidence of mitral valve regurgitation. No evidence of mitral valve stenosis. Tricuspid Valve: The tricuspid valve is normal in structure. Tricuspid valve regurgitation is mild to moderate. Aortic Valve: The aortic valve is tricuspid. Aortic valve regurgitation is mild. Aortic valve sclerosis/calcification is present, without any evidence of aortic stenosis. Aortic valve mean gradient measures 4.0 mmHg. Aortic valve peak gradient measures 5.7 mmHg. Aortic valve area, by VTI measures 2.09 cm. Pulmonic Valve: The pulmonic valve was not well visualized. Pulmonic valve regurgitation is trivial. Aorta: The aortic root and ascending aorta are structurally normal, with no evidence of dilitation. Venous: The inferior vena cava is normal in size with greater than 50% respiratory variability, suggesting right atrial pressure of 3 mmHg. IAS/Shunts: The interatrial septum was not well visualized.  LEFT VENTRICLE PLAX 2D LVIDd:         3.60 cm     Diastology LVIDs:         2.00 cm     LV e' medial:    6.96 cm/s LV PW:         0.90 cm     LV E/e' medial:  10.0 LV IVS:        0.80 cm     LV e' lateral:   6.53 cm/s LVOT diam:     1.90 cm     LV E/e' lateral: 10.7 LV SV:         44 LV SV Index:   23 LVOT Area:     2.84 cm  LV Volumes (MOD) LV vol d, MOD A4C: 77.7 ml LV SV MOD A4C:     77.7 ml RIGHT VENTRICLE             IVC RV S prime:     14.00 cm/s  IVC diam: 1.40 cm LEFT ATRIUM         Index LA  diam:    2.90 cm 1.50 cm/m  AORTIC VALVE                    PULMONIC VALVE AV Area (Vmax):    2.16 cm     PV Vmax:       1.11 m/s AV Area (Vmean):   1.91 cm     PV Peak grad:  4.9 mmHg AV Area (VTI):     2.09 cm AV Vmax:           119.00 cm/s AV Vmean:          90.100 cm/s AV VTI:            0.209 m AV Peak Grad:      5.7 mmHg AV Mean Grad:      4.0 mmHg LVOT Vmax:         90.70 cm/s LVOT Vmean:        60.700 cm/s LVOT VTI:          0.154 m LVOT/AV VTI  ratio: 0.74  AORTA Ao Root diam: 3.50 cm Ao Asc diam:  3.50 cm MITRAL VALVE               TRICUSPID VALVE MV Area (PHT): 3.91 cm    TR Peak grad:   34.1 mmHg MV Decel Time: 194 msec    TR Vmax:        292.00 cm/s MV E velocity: 69.80 cm/s MV A velocity: 90.00 cm/s  SHUNTS MV E/A ratio:  0.78        Systemic VTI:  0.15 m                            Systemic Diam: 1.90 cm Oswaldo Milian MD Electronically signed by Oswaldo Milian MD Signature Date/Time: 08/07/2021/1:07:04 PM    Final    VAS [place] LOWER EXTREMITY VENOUS (DVT)  Result Date: 08/07/2021  Lower Venous DVT Study Patient Name:  NEKESHIA LENHARDT  Date of Exam:   08/07/2021 Medical Rec #: [id number]     Accession #:    [id number] Date of Birth: [birth year]      Patient Gender: F Patient Age:   67 years Exam Location:  [facility] Procedure:      VAS [place] LOWER EXTREMITY VENOUS (DVT) Referring Phys: Aline August --------------------------------------------------------------------------------  Indications: Pulmonary embolism.  Limitations: Body habitus and poor ultrasound/tissue interface. Comparison Study: Previous exam on 02/12/2020 was negative for DVT. Performing Technologist: Rogelia Rohrer RVT, RDMS  Examination Guidelines: A complete evaluation includes B-mode imaging, spectral Doppler, color Doppler, and power Doppler as needed of all accessible portions of each vessel. Bilateral testing is considered an integral part of a complete examination. Limited examinations for reoccurring indications may be performed as noted. The reflux portion of the exam is performed with the patient in reverse Trendelenburg.  +---------+---------------+---------+-----------+----------+------------------+ RIGHT    CompressibilityPhasicitySpontaneityPropertiesThrombus Aging     +---------+---------------+---------+-----------+----------+------------------+ CFV      Full           Yes      Yes                                      +---------+---------------+---------+-----------+----------+------------------+ SFJ      Full                                                            +---------+---------------+---------+-----------+----------+------------------+  FV Prox  Full           Yes      Yes                                     +---------+---------------+---------+-----------+----------+------------------+ FV Mid   Full           Yes      Yes                                     +---------+---------------+---------+-----------+----------+------------------+ FV DistalFull           Yes      Yes                                     +---------+---------------+---------+-----------+----------+------------------+ PFV      Full                                                            +---------+---------------+---------+-----------+----------+------------------+ POP      Full           Yes      Yes                                     +---------+---------------+---------+-----------+----------+------------------+ PTV      Partial        No       No                   Acute - one of                                                           paired             +---------+---------------+---------+-----------+----------+------------------+ PERO     Full                                                            +---------+---------------+---------+-----------+----------+------------------+   +---------+---------------+---------+-----------+----------+-------------------+ LEFT     CompressibilityPhasicitySpontaneityPropertiesThrombus Aging      +---------+---------------+---------+-----------+----------+-------------------+ CFV      Full           Yes      Yes                                      +---------+---------------+---------+-----------+----------+-------------------+ SFJ      Full  Not well visualized  +---------+---------------+---------+-----------+----------+-------------------+ FV Prox  Full           Yes      Yes                                      +---------+---------------+---------+-----------+----------+-------------------+ FV Mid   Full           Yes      Yes                                      +---------+---------------+---------+-----------+----------+-------------------+ FV DistalFull           Yes      Yes                                      +---------+---------------+---------+-----------+----------+-------------------+ PFV      Full                                                             +---------+---------------+---------+-----------+----------+-------------------+ POP      Full           Yes      Yes                                      +---------+---------------+---------+-----------+----------+-------------------+ PTV      Full                                                             +---------+---------------+---------+-----------+----------+-------------------+ PERO     Full                                                             +---------+---------------+---------+-----------+----------+-------------------+     Summary: BILATERAL: - No evidence of superficial venous thrombosis in the lower extremities, bilaterally. -No evidence of popliteal cyst, bilaterally. RIGHT: - Findings consistent with acute deep vein thrombosis involving the right posterior tibial veins.  LEFT: - There is no evidence of deep vein thrombosis in the lower extremity.  *See table(s) above for measurements and observations. Electronically signed by Monica Martinez MD on 08/07/2021 at 1:50:38 PM.    Final         Scheduled Meds:  acyclovir  400 mg Oral BID   DULoxetine  30 mg Oral q AM   DULoxetine  60 mg Oral QHS   mirabegron ER  50 mg Oral QHS   pantoprazole (PROTONIX) IV  40 mg Intravenous QHS   QUEtiapine  100 mg Oral QHS   Continuous  Infusions:  heparin 1,100 Units/hr (08/08/21 0244)   levETIRAcetam 250 mg (  08/07/21 2303)          Aline August, MD [facility] 08/08/2021, 8:02 AM

## 2021-08-08 NOTE — Plan of Care (Signed)

## 2021-08-08 NOTE — Progress Notes (Signed)
ANTICOAGULATION CONSULT NOTE - Follow Up Consult  Pharmacy Consult for Heparin Indication: pulmonary embolus and DVT, RLE. (PTA Xarelto stopped ~3 weeks ago due to nosebleeds and was reversed with Eppie Gibson on 11/15)  Allergies  Allergen Reactions   Demerol [Meperidine] Other (See Comments)    Hallucinations   Percocet [Oxycodone-Acetaminophen] Itching   Amoxicillin-Pot Clavulanate Diarrhea    Severe pain, headache, intestinal infection   Penicillins Itching and Rash    Tolerated amoxicillin November 2022  Has patient had a PCN reaction causing immediate rash, facial/tongue/throat swelling, SOB or lightheadedness with hypotension:  NO Has patient had a PCN reaction causing severe rash involving mucus membranes or skin necrosis: No Has patient had a PCN reaction that required hospitalization: No Has patient had a PCN reaction occurring within the last 10 years: Yes If all of the above answers are "NO", then may proceed with Cephalosporin use.    Patient Measurements: Height: 5\' 7"  (170.2 cm) Weight: 81 kg (178 lb 9.2 oz) IBW/kg (Calculated) : 61.6 Heparin Dosing Weight: 78.2 kg  Vital Signs: Temp: 97.9 F (36.6 C) (12/06 1134) Temp Source: Axillary (12/06 1134) BP: 94/73 (12/06 1134) Pulse Rate: 100 (12/06 1134)  Labs: Recent Labs    08/06/21 2225 08/06/21 2330 08/07/21 0052 08/07/21 0500 08/07/21 0740 08/07/21 1140 08/07/21 2129 08/08/21 0038  HGB 12.9 14.6  --   --  10.5*  --   --  9.7*  HCT 42.2 43.0  --   --  34.9*  --   --  32.1*  PLT 292  --   --   --  237  --   --  200  APTT 28  --   --   --   --   --   --   --   LABPROT 13.4  --   --   --   --   --   --   --   INR 1.0  --   --   --   --   --   --   --   HEPARINUNFRC  --   --   --   --   --  0.14* 0.67 0.39  CREATININE 0.74 0.80  --  0.67  --   --   --  0.66  TROPONINIHS 39*  --  48*  --  29*  --   --   --     Estimated Creatinine Clearance: 68.6 mL/min (by C-G formula based on SCr of 0.66  mg/dL).  Assessment: 75 yo F with acute BL segmental and subsegmental PE with mild RHS on 12/5 and RLE DVT per duplex. Patient with DVT hx on rivaroxaban, which caused epistaxis requiring IR embolization and Kcentra reversal 11/15. No anticoagulation prior to admission. Pharmacy consulted for heparin, begun ~2 am on 12/5.    Heparin level is therapeutic (0.39) on 1100 units/hr   Hemoglobin trended down. No bleeding reported.  Goal of Therapy:  Heparin level 0.3-0.7 units/ml Monitor platelets by anticoagulation protocol: Yes   Plan:  Continue heparin drip at 1100 units/hr Daily heparin level and CBC while on heparin. Monitor for signs/symptoms of bleeding.  Noted plan for oral anticoagulation after 48hrs IV heparin, if no signs of bleeding.  Arty Baumgartner, RPh 08/08/2021,12:30 PM

## 2021-08-08 NOTE — Progress Notes (Signed)
  Patient's MEWS yellow; MD aware.     08/07/21 2030  Assess: MEWS Score  Temp 98.1 F (36.7 C)  BP 125/86  Pulse Rate (!) 107  ECG Heart Rate (!) 108  Resp (!) 29  Level of Consciousness Alert  SpO2 93 %  O2 Device Nasal Cannula  O2 Flow Rate (L/min) 4 L/min  Assess: MEWS Score  MEWS Temp 0  MEWS Systolic 0  MEWS Pulse 1  MEWS RR 2  MEWS LOC 0  MEWS Score 3  MEWS Score Color Yellow  Assess: if the MEWS score is Yellow or Red  Were vital signs taken at a resting state? Yes  Focused Assessment No change from prior assessment  Early Detection of Sepsis Score *See Row Information* Low  MEWS guidelines implemented *See Row Information* Yes  Treat  MEWS Interventions Administered scheduled meds/treatments;Other (Comment) (MD notified of home meds)  Pain Scale 0-10  Pain Score 0  Take Vital Signs  Increase Vital Sign Frequency  Yellow: Q 2hr X 2 then Q 4hr X 2, if remains yellow, continue Q 4hrs  Escalate  MEWS: Escalate Yellow: discuss with charge nurse/RN and consider discussing with provider and RRT  Notify: Charge Nurse/RN  Name of Charge Nurse/RN Notified NIcole RN  Date Charge Nurse/RN Notified 08/07/21  Time Charge Nurse/RN Notified 2045  Notify: Provider  Provider Name/Title Dr. Tonie Griffith  Date Provider Notified 08/07/21  Time Provider Notified 2050  Notification Type Page (secure chat)  Notification Reason Other (Comment) (Increased HR and increased RR)  Provider response No new orders  Date of Provider Response 08/07/21  Time of Provider Response 2055

## 2021-08-09 ENCOUNTER — Other Ambulatory Visit (HOSPITAL_COMMUNITY): Payer: Self-pay

## 2021-08-09 DIAGNOSIS — D72829 Elevated white blood cell count, unspecified: Secondary | ICD-10-CM

## 2021-08-09 LAB — COMPREHENSIVE METABOLIC PANEL
ALT: 25 U/L (ref 0–44)
AST: 23 U/L (ref 15–41)
Albumin: 2.8 g/dL — ABNORMAL LOW (ref 3.5–5.0)
Alkaline Phosphatase: 85 U/L (ref 38–126)
Anion gap: 9 (ref 5–15)
BUN: 11 mg/dL (ref 8–23)
CO2: 28 mmol/L (ref 22–32)
Calcium: 8.3 mg/dL — ABNORMAL LOW (ref 8.9–10.3)
Chloride: 101 mmol/L (ref 98–111)
Creatinine, Ser: 0.6 mg/dL (ref 0.44–1.00)
GFR, Estimated: >60 mL/min (ref >60)
Glucose, Bld: 143 mg/dL — ABNORMAL HIGH (ref 70–99)
Potassium: 4.1 mmol/L (ref 3.5–5.1)
Sodium: 138 mmol/L (ref 135–145)
Total Bilirubin: 0.5 mg/dL (ref 0.3–1.2)
Total Protein: 5.7 g/dL — ABNORMAL LOW (ref 6.5–8.1)

## 2021-08-09 LAB — CBC
HCT: 33.2 % — ABNORMAL LOW (ref 36.0–46.0)
Hemoglobin: 10.6 g/dL — ABNORMAL LOW (ref 12.0–15.0)
MCH: 30.8 pg (ref 26.0–34.0)
MCHC: 31.9 g/dL (ref 30.0–36.0)
MCV: 96.5 fL (ref 80.0–100.0)
Platelets: 190 10*3/uL (ref 150–400)
RBC: 3.44 MIL/uL — ABNORMAL LOW (ref 3.87–5.11)
RDW: 17.1 % — ABNORMAL HIGH (ref 11.5–15.5)
WBC: 12.1 10*3/uL — ABNORMAL HIGH (ref 4.0–10.5)
nRBC: 0 % (ref 0.0–0.2)

## 2021-08-09 LAB — HEPARIN LEVEL (UNFRACTIONATED): Heparin Unfractionated: 0.7 IU/mL (ref 0.30–0.70)

## 2021-08-09 LAB — MAGNESIUM: Magnesium: 2.2 mg/dL (ref 1.7–2.4)

## 2021-08-09 LAB — URINE CULTURE: Culture: 70000 — AB

## 2021-08-09 MED ORDER — BOOST / RESOURCE BREEZE PO LIQD CUSTOM
1.0000 | Freq: Two times a day (BID) | ORAL | Status: DC
  Administered 2021-08-09 – 2021-08-10 (×2): 1 via ORAL

## 2021-08-09 MED ORDER — APIXABAN 5 MG PO TABS
5.0000 mg | ORAL_TABLET | Freq: Two times a day (BID) | ORAL | Status: DC
  Administered 2021-08-13 – 2021-08-15 (×6): 5 mg via ORAL
  Filled 2021-08-09 (×6): qty 1

## 2021-08-09 MED ORDER — ALBUTEROL SULFATE (2.5 MG/3ML) 0.083% IN NEBU
2.5000 mg | INHALATION_SOLUTION | RESPIRATORY_TRACT | Status: DC | PRN
  Administered 2021-08-09 (×2): 2.5 mg via RESPIRATORY_TRACT
  Filled 2021-08-09 (×2): qty 3

## 2021-08-09 MED ORDER — APIXABAN 5 MG PO TABS
10.0000 mg | ORAL_TABLET | Freq: Two times a day (BID) | ORAL | Status: AC
  Administered 2021-08-09 – 2021-08-12 (×8): 10 mg via ORAL
  Filled 2021-08-09 (×8): qty 2

## 2021-08-09 MED ORDER — DM-GUAIFENESIN ER 30-600 MG PO TB12
1.0000 | ORAL_TABLET | Freq: Two times a day (BID) | ORAL | Status: DC
  Administered 2021-08-09 – 2021-08-12 (×6): 1 via ORAL
  Filled 2021-08-09 (×6): qty 1

## 2021-08-09 MED ORDER — OXYMETAZOLINE HCL 0.05 % NA SOLN
3.0000 | Freq: Two times a day (BID) | NASAL | Status: AC | PRN
Start: 2021-08-09 — End: 2021-08-12
  Administered 2021-08-09: 3 via NASAL
  Filled 2021-08-09: qty 30

## 2021-08-09 MED ORDER — SALINE SPRAY 0.65 % NA SOLN
1.0000 | NASAL | Status: DC | PRN
  Filled 2021-08-09: qty 44

## 2021-08-09 NOTE — Progress Notes (Signed)
RN called to pt room by spouse.  Spouse stated pts nose bleeding.  Minimal blood coming from  on tissue.  RN looked in pt nose, minimal blood in pts right nare.  Family also concerned about pts breathing, stating that she is working harder to breathe.  MD notified.  New orders received.  Will continue to monitor.

## 2021-08-09 NOTE — TOC Initial Note (Signed)
Transition of Care [facility]) - Initial/Assessment Note    Patient Details  Name: Katelyn Lamb MRN: [id number] Date of Birth: 30-May-1946  Transition of Care [facility]) CM/SW Contact:    Geralynn Ochs, LCSW Phone Number: 08/09/2021, 2:58 PM  Clinical Narrative:          CSW spoke with patient's spouse, [name], about return to SNF. [name] initially said plan was to return to [facility] but then expressed interest in looking at if other options might be available. [name] said that patient has been to [facility] before, and they've generally been happy with the care there, but that the past couple days that the patient has been there she hasn't really gotten out of bed. CSW faxed out referral and will follow up with [name] on bed offers. [facility] can accept patient back if family wants to return. CSW contacted [employer] to initiate insurance authorization request.          Expected Discharge Plan: Skilled Nursing Facility Barriers to Discharge: Continued Medical Work up, Ship broker   Patient Goals and CMS Choice Patient states their goals for this hospitalization and ongoing recovery are:: to get rehab CMS Medicare.gov Compare Post Acute Care list provided to:: Patient Represenative (must comment) Choice offered to / list presented to : Spouse  Expected Discharge Plan and Services Expected Discharge Plan: [place] Choice: [place] Living arrangements for the past 2 months: Single Family Home                                      Prior Living Arrangements/Services Living arrangements for the past 2 months: Single Family Home Lives with:: Spouse Patient language and need for interpreter reviewed:: No Do you feel safe going back to the place where you live?: Yes      Need for Family Participation in Patient Care: Yes (Comment) Care giver support system in place?: Yes (comment)   Criminal  Activity/Legal Involvement Pertinent to Current Situation/Hospitalization: No - Comment as needed  Activities of Daily Living Home Assistive Devices/Equipment: Wheelchair ADL Screening (condition at time of admission) Patient's cognitive ability adequate to safely complete daily activities?: Yes Is the patient deaf or have difficulty hearing?: No Does the patient have difficulty seeing, even when wearing glasses/contacts?: No Does the patient have difficulty concentrating, remembering, or making decisions?: No Patient able to express need for assistance with ADLs?: Yes Does the patient have difficulty dressing or bathing?: Yes Independently performs ADLs?: No Communication: Independent Dressing (OT): Needs assistance Is this a change from baseline?: Pre-admission baseline Grooming: Needs assistance Is this a change from baseline?: Pre-admission baseline Feeding: Needs assistance Is this a change from baseline?: Pre-admission baseline Bathing: Needs assistance Is this a change from baseline?: Pre-admission baseline Toileting: Needs assistance Is this a change from baseline?: Pre-admission baseline In/Out Bed: Dependent Is this a change from baseline?: Pre-admission baseline Walks in Home: Dependent Is this a change from baseline?: Pre-admission baseline Does the patient have difficulty walking or climbing stairs?: Yes Weakness of Legs: Both Weakness of Arms/Hands: None  Permission Sought/Granted Permission sought to share information with : Facility Sport and exercise psychologist, Family Supports Permission granted to share information with : Yes, Verbal Permission Granted  Share Information with NAME: [name]  Permission granted to share info w AGENCY: SNF  Permission granted to share info w Relationship: Spouse  Emotional Assessment Appearance:: Appears stated age Attitude/Demeanor/Rapport: Engaged Affect (typically observed): Appropriate Orientation: : Oriented to Self, Oriented  to Place, Oriented to  Time, Oriented to Situation Alcohol / Substance Use: Not Applicable Psych Involvement: No (comment)  Admission diagnosis:  Abdominal distension [R14.0] Bilateral pulmonary embolism (HCC) [I26.99] Acute pulmonary embolism (HCC) [I26.99] Febrile illness [R50.9] Patient Active Problem List   Diagnosis Date Noted   Acute pulmonary embolism ([place]) 08/07/2021   Abdominal pain 08/07/2021   Elevated troponin 08/07/2021   Lactic acidosis 08/07/2021   Seizure disorder ([place]) 08/07/2021   GERD (gastroesophageal reflux disease) 08/07/2021   Platelet inhibition due to Plavix    Epistaxis s/p embolization 07-18-2021 07/17/2021   Asymptomatic bacteriuria    History of ESBL E. coli infection 07/14/2021   History of ESBL Klebsiella pneumoniae infection 07/14/2021   Generalized weakness 03/15/2021   Acute cystitis with hematuria 03/15/2021   Encephalopathy 03/15/2021   Recurrent falls 03/15/2021   Right pulmonary embolus ([place]) 02/12/2020   Lower extremity weakness 02/02/2020   Macrocytic anemia 02/02/2020   Spinal stenosis 02/02/2020   History of recurrent UTI (urinary tract infection)    Bacterial infection due to Klebsiella pneumoniae    Laceration of left hand 12/09/2019   DNR (do not resuscitate) 12/09/2019   Pelvic fracture ([place]) 12/06/2019   AKI (acute kidney injury) ([place]) 12/06/2019   Urinary tract infection associated with catheterization of urinary tract, initial encounter ([place]) 07/23/2019   Hematuria 07/23/2019   Acute respiratory failure with hypoxia ([place]) 07/23/2019   Hypokalemia 07/23/2019   Acute on chronic anemia 07/23/2019   Fever 07/23/2019   Leukocytosis 07/23/2019   Sepsis ([place]) 07/23/2019   Generalized anxiety disorder 06/17/2019   Menopausal sweats 06/17/2019   Memory difficulty 03/02/2019   Degenerative spondylolisthesis 09/02/2018   Delirium 03/26/2018   Degeneration of lumbar intervertebral disc 11/08/2017   Lumbar radiculopathy 11/06/2017    Chronic neck pain 09/12/2017   Chronic low back pain 09/06/2017   Chronic pain syndrome 09/06/2017   Dilated pancreatic duct 01/24/2017   DVT, lower extremity, distal, chronic (HCC) 01/22/2017   Elevated serum GGT level 01/22/2017   Medication monitoring encounter 01/08/2017   Neurologic gait dysfunction 11/14/2016   Hypotension due to drugs    Urinary retention    Anxiety about health    Reactive depression    Ataxia    Abdominal spasms    Constipation due to pain medication    Acute lower UTI    Dysuria    Acute deep vein thrombosis (DVT) of popliteal vein of left lower extremity (HCC)    Incomplete paraplegia (HCC)    Acute blood loss anemia    Neurogenic bladder    Neuropathic pain    Muscle spasm    Gastroesophageal reflux disease    Slow transit constipation    Thrombocytopenia (HCC) 10/03/2016   Abnormal MRI, spinal cord    Encephalomyelitis    Numbness    Intractable back pain 09/20/2016   Numbness of left lower extremity 09/20/2016   Hyponatremia 09/20/2016   Herpes zoster without complication 26/83/4196   Spondylosis of cervical region without myelopathy or radiculopathy 06/16/2015   PCP:  [name], MD Pharmacy:  No Pharmacies Listed    Social Determinants of Health (SDOH) Interventions    Readmission Risk Interventions No flowsheet data found.

## 2021-08-09 NOTE — Discharge Instructions (Signed)
Information on my medicine - ELIQUIS (apixaban)  This medication education was reviewed with me or my healthcare representative as part of my discharge preparation.  The pharmacist that spoke with me during my hospital stay was:    Why was Eliquis prescribed for you? Eliquis was prescribed to treat blood clots that may have been found in the veins of your legs (deep vein thrombosis) or in your lungs (pulmonary embolism) and to reduce the risk of them occurring again.  What do You need to know about Eliquis ? The starting dose is 10 mg (two 5 mg tablets) taken TWICE daily for the FIRST SEVEN (7) DAYS, then on (enter date)  08/13/21  the dose is reduced to ONE 5 mg tablet taken TWICE daily.  Eliquis may be taken with or without food.   Try to take the dose about the same time in the morning and in the evening. If you have difficulty swallowing the tablet whole please discuss with your pharmacist how to take the medication safely.  Take Eliquis exactly as prescribed and DO NOT stop taking Eliquis without talking to the doctor who prescribed the medication.  Stopping may increase your risk of developing a new blood clot.  Refill your prescription before you run out.  After discharge, you should have regular check-up appointments with your healthcare provider that is prescribing your Eliquis.    What do you do if you miss a dose? If a dose of ELIQUIS is not taken at the scheduled time, take it as soon as possible on the same day and twice-daily administration should be resumed. The dose should not be doubled to make up for a missed dose.  Important Safety Information A possible side effect of Eliquis is bleeding. You should call your healthcare provider right away if you experience any of the following: Bleeding from an injury or your nose that does not stop. Unusual colored urine (red or dark brown) or unusual colored stools (red or black). Unusual bruising for unknown reasons. A  serious fall or if you hit your head (even if there is no bleeding).  Some medicines may interact with Eliquis and might increase your risk of bleeding or clotting while on Eliquis. To help avoid this, consult your healthcare provider or pharmacist prior to using any new prescription or non-prescription medications, including herbals, vitamins, non-steroidal anti-inflammatory drugs (NSAIDs) and supplements.  This website has more information on Eliquis (apixaban): http://[email or web address].com/eliquis/home

## 2021-08-09 NOTE — Progress Notes (Signed)
PROGRESS NOTE        PATIENT DETAILS Name: Katelyn Lamb Age: 75 y.o. Sex: female Date of Birth: 1946-06-21 Admit Date: 08/06/2021 Admitting Physician Rhetta Mura, DO BOF:BPZWCHE, [place]ibas, MD  Brief Narrative: Patient is a 75 y.o. female with history of prior VTE, seizure disorder, HSV encephalomyelitis in 2018 with baseline gait dysfunction requiring a walker-recent hospitalization from 11/11-11/30  UTI and epistaxis requiring intubation and embolization of the left sphenopalatine artery by IR (off Xarelto since then)-presented with shortness of breath-found to have PE and right lower extremity DVT.  See below for further details.  Subjective: Lying comfortably in bed-denies any chest pain or shortness of breath.  Removed oxygen this morning while I was in the room-O2 saturations ranged from 89 to 92% on room air.  Objective: Vitals: Blood pressure 95/69, pulse 93, temperature (!) 97.3 F (36.3 C), temperature source Oral, resp. rate 19, height 5\' 7"  (1.702 m), weight 77 kg, SpO2 94 %.   Exam: Gen Exam:Alert awake-not in any distress HEENT:atraumatic, normocephalic Chest: B/L clear to auscultation anteriorly CVS:S1S2 regular Abdomen:soft non tender, non distended Extremities:no edema Neurology: Non focal Skin: no rash  Pertinent Labs/Radiology: Recent Labs  Lab 08/06/21 2225 08/06/21 2330 08/07/21 0500 08/07/21 0740 08/09/21 0156  WBC 24.4*  --   --    < > 12.1*  HGB 12.9   < >  --    < > 10.6*  PLT 292  --   --    < > 190  NA 136   < > 132*   < > 138  K 4.2   < > 3.8   < > 4.1  CREATININE 0.74   < > 0.67   < > 0.60  AST 28  --  25  --  23  ALT 40  --  33  --  25  ALKPHOS 109  --  90  --  85  BILITOT 0.8  --  0.9  --  0.5   < > = values in this interval not displayed.     Assessment/Plan: Acute hypoxic respiratory failure due to PE: Hypoxia improving-transition to room air today.  Continue anticoagulation.  Bilateral lobar,  segmental and subsegmental PE with right lower extremity DVT: Hypoxia improving-other hemodynamics remained stable.  Although has CT evidence of RV strain-no clinical signs of RV failure.Tolerating IV heparin since 12/5 without any bleeding complications (no epistaxis)-extensive discussion regarding risks/benefits of long-term anticoagulation held by this MD with this patient/spouse (at bedside)/son (over the phone)-all understand that benefits outweigh risks-all accept risks including catastrophic bleeding.  Subsequently discussed different options of long-term anticoagulation-Eliquis/Xarelto versus Coumadin.  Patient/family elected to try Eliquis.  Stop heparin-restart Eliquis per pharmacy-watch overnight closely.  Portal venous gas in the liver: Seen incidentally on CT imaging-no pneumatosis seen on CT imaging-abdominal exam remains benign-unclear significance of this finding.  Leukocytosis: Possibly reactive-from DVT/PE-downtrending without the use of any antimicrobial therapy.  Asymptomatic bacteriuria: Likely colonization-continue to monitor off antimicrobial therapy.  Seizure disorder: Continue Keppra  GERD: Continue PPI  History of HSV encephalomyelitis with chronic gait dysfunction: Weakness has worsened due to acute hospitalization-plans of SNF on discharge.  Neurogenic bladder with chronic indwelling suprapubic catheter: Due to sequelae of HSV encephalomyelitis  Chronic steroid use: On usual dosing of prednisone.  BMI Estimated body mass index is 26.59 kg/m as calculated from  the following:   Height as of this encounter: 5\' 7"  (1.702 m).   Weight as of this encounter: 77 kg.   Procedures: None Consults: PCCM DVT Prophylaxis:IV heparin>>eliquis Code Status: DNR Family Communication: Spouse at bedside-son over the phone.  Time spent: 45 minutes-Greater than 50% of this time was spent in counseling, explanation of diagnosis, planning of further management, and coordination of  care.   Disposition Plan: Status is: Inpatient  Remains inpatient appropriate because: Resolving hypoxia due to large PE.    Diet: Diet Order             DIET SOFT Room service appropriate? Yes with Assist; Fluid consistency: Thin  Diet effective now                     Antimicrobial agents: Anti-infectives (From admission, onward)    Start     Dose/Rate Route Frequency Ordered Stop   08/07/21 2330  acyclovir (ZOVIRAX) tablet 400 mg        400 mg Oral 2 times daily 08/07/21 2233     08/07/21 2200  vancomycin (VANCOREADY) IVPB 1500 mg/300 mL  Status:  Discontinued        1,500 mg 150 mL/hr over 120 Minutes Intravenous Every 24 hours 08/07/21 0128 08/07/21 0546   08/07/21 0600  aztreonam (AZACTAM) 1 g in sodium chloride 0.9 % 100 mL IVPB  Status:  Discontinued        1 g 200 mL/hr over 30 Minutes Intravenous Every 8 hours 08/07/21 0128 08/07/21 0546   08/06/21 2300  vancomycin (VANCOREADY) IVPB 1500 mg/300 mL        1,500 mg 150 mL/hr over 120 Minutes Intravenous  Once 08/06/21 2227 08/07/21 0143   08/06/21 2230  aztreonam (AZACTAM) 2 g in sodium chloride 0.9 % 100 mL IVPB        2 g 200 mL/hr over 30 Minutes Intravenous  Once 08/06/21 2227 08/06/21 2329   08/06/21 2230  metroNIDAZOLE (FLAGYL) IVPB 500 mg        500 mg 100 mL/hr over 60 Minutes Intravenous  Once 08/06/21 2227 08/07/21 0029        MEDICATIONS: Scheduled Meds:  acyclovir  400 mg Oral BID   apixaban  10 mg Oral BID   Followed by   Derrill Memo ON 08/13/2021] apixaban  5 mg Oral BID   DULoxetine  30 mg Oral q AM   DULoxetine  60 mg Oral QHS   feeding supplement  1 Container Oral BID BM   mirabegron ER  50 mg Oral QHS   pantoprazole (PROTONIX) IV  40 mg Intravenous QHS   predniSONE  5 mg Oral Q breakfast   QUEtiapine  100 mg Oral QHS   Continuous Infusions:  levETIRAcetam Stopped (08/09/21 0936)   PRN Meds:.acetaminophen **OR** acetaminophen, diazepam, fentaNYL (SUBLIMAZE) injection, lip balm,  naLOXone (NARCAN)  injection   I have personally reviewed following labs and imaging studies  LABORATORY DATA: CBC: Recent Labs  Lab 08/06/21 2225 08/06/21 2330 08/07/21 0740 08/08/21 0038 08/09/21 0156  WBC 24.4*  --  20.2* 14.6* 12.1*  NEUTROABS 18.2*  --   --   --   --   HGB 12.9 14.6 10.5* 9.7* 10.6*  HCT 42.2 43.0 34.9* 32.1* 33.2*  MCV 98.8  --  97.8 96.4 96.5  PLT 292  --  237 200 128    Basic Metabolic Panel: Recent Labs  Lab 08/06/21 2225 08/06/21 2330 08/07/21 0500 08/08/21 0038 08/09/21 0156  NA 136 135 132* 135 138  K 4.2 4.1 3.8 3.8 4.1  CL 102 106 100 101 101  CO2 20*  --  23 23 28   GLUCOSE 136* 132* 112* 82 143*  BUN 27* 33* 21 12 11   CREATININE 0.74 0.80 0.67 0.66 0.60  CALCIUM 9.5  --  8.8* 8.2* 8.3*  MG  --   --  1.6* 1.5* 2.2  PHOS  --   --  3.9  --   --     GFR: Estimated Creatinine Clearance: 67 mL/min (by C-G formula based on SCr of 0.6 mg/dL).  Liver Function Tests: Recent Labs  Lab 08/06/21 2225 08/07/21 0500 08/09/21 0156  AST 28 25 23   ALT 40 33 25  ALKPHOS 109 90 85  BILITOT 0.8 0.9 0.5  PROT 6.9 5.9* 5.7*  ALBUMIN 3.5 2.9* 2.8*   No results for input(s): LIPASE, AMYLASE in the last 168 hours. No results for input(s): AMMONIA in the last 168 hours.  Coagulation Profile: Recent Labs  Lab 08/06/21 2225  INR 1.0    Cardiac Enzymes: No results for input(s): CKTOTAL, CKMB, CKMBINDEX, TROPONINI in the last 168 hours.  BNP (last 3 results) No results for input(s): PROBNP in the last 8760 hours.  Lipid Profile: No results for input(s): CHOL, HDL, LDLCALC, TRIG, CHOLHDL, LDLDIRECT in the last 72 hours.  Thyroid Function Tests: No results for input(s): TSH, T4TOTAL, FREET4, T3FREE, THYROIDAB in the last 72 hours.  Anemia Panel: No results for input(s): VITAMINB12, FOLATE, FERRITIN, TIBC, IRON, RETICCTPCT in the last 72 hours.  Urine analysis:    Component Value Date/Time   COLORURINE YELLOW 08/06/2021 2300    APPEARANCEUR CLOUDY (A) 08/06/2021 2300   LABSPEC 1.021 08/06/2021 2300   PHURINE 5.0 08/06/2021 2300   GLUCOSEU NEGATIVE 08/06/2021 2300   HGBUR NEGATIVE 08/06/2021 2300   BILIRUBINUR NEGATIVE 08/06/2021 2300   KETONESUR NEGATIVE 08/06/2021 2300   PROTEINUR 30 (A) 08/06/2021 2300   NITRITE NEGATIVE 08/06/2021 2300   LEUKOCYTESUR MODERATE (A) 08/06/2021 2300    Sepsis Labs: Lactic Acid, Venous    Component Value Date/Time   LATICACIDVEN 1.7 08/07/2021 0556    MICROBIOLOGY: Recent Results (from the past 240 hour(s))  Resp Panel by RT-PCR (Flu A&B, Covid) Nasopharyngeal Swab     Status: None   Collection Time: 08/02/21 11:38 AM   Specimen: Nasopharyngeal Swab; Nasopharyngeal(NP) swabs in vial transport medium  Result Value Ref Range Status   SARS Coronavirus 2 by RT PCR NEGATIVE NEGATIVE Final    Comment: (NOTE) SARS-CoV-2 target nucleic acids are NOT DETECTED.  The SARS-CoV-2 RNA is generally detectable in upper respiratory specimens during the acute phase of infection. The lowest concentration of SARS-CoV-2 viral copies this assay can detect is 138 copies/mL. A negative result does not preclude SARS-Cov-2 infection and should not be used as the sole basis for treatment or other patient management decisions. A negative result may occur with  improper specimen collection/handling, submission of specimen other than nasopharyngeal swab, presence of viral mutation(s) within the areas targeted by this assay, and inadequate number of viral copies(<138 copies/mL). A negative result must be combined with clinical observations, patient history, and epidemiological information. The expected result is Negative.  Fact Sheet for Patients:  EntrepreneurPulse.com.au  Fact Sheet for Healthcare Providers:  IncredibleEmployment.be  This test is no t yet approved or cleared by the [place] FDA and  has been authorized for detection and/or diagnosis  of SARS-CoV-2 by FDA under an Emergency Use Authorization (  EUA). This EUA will remain  in effect (meaning this test can be used) for the duration of the COVID-19 declaration under Section 564(b)(1) of the Act, 21 U.S.C.section 360bbb-3(b)(1), unless the authorization is terminated  or revoked sooner.       Influenza A by PCR NEGATIVE NEGATIVE Final   Influenza B by PCR NEGATIVE NEGATIVE Final    Comment: (NOTE) The Xpert Xpress SARS-CoV-2/FLU/RSV plus assay is intended as an aid in the diagnosis of influenza from Nasopharyngeal swab specimens and should not be used as a sole basis for treatment. Nasal washings and aspirates are unacceptable for Xpert Xpress SARS-CoV-2/FLU/RSV testing.  Fact Sheet for Patients: EntrepreneurPulse.com.au  Fact Sheet for Healthcare Providers: IncredibleEmployment.be  This test is not yet approved or cleared by the [place] FDA and has been authorized for detection and/or diagnosis of SARS-CoV-2 by FDA under an Emergency Use Authorization (EUA). This EUA will remain in effect (meaning this test can be used) for the duration of the COVID-19 declaration under Section 564(b)(1) of the Act, 21 U.S.C. section 360bbb-3(b)(1), unless the authorization is terminated or revoked.  Performed at [facility], [place] [address], [place], [place] [address]   Resp Panel by RT-PCR (Flu A&B, Covid) Nasopharyngeal Swab     Status: None   Collection Time: 08/06/21 10:25 PM   Specimen: Nasopharyngeal Swab; Nasopharyngeal(NP) swabs in vial transport medium  Result Value Ref Range Status   SARS Coronavirus 2 by RT PCR NEGATIVE NEGATIVE Final    Comment: (NOTE) SARS-CoV-2 target nucleic acids are NOT DETECTED.  The SARS-CoV-2 RNA is generally detectable in upper respiratory specimens during the acute phase of infection. The lowest concentration of SARS-CoV-2 viral copies this assay can detect is 138 copies/mL. A negative  result does not preclude SARS-Cov-2 infection and should not be used as the sole basis for treatment or other patient management decisions. A negative result may occur with  improper specimen collection/handling, submission of specimen other than nasopharyngeal swab, presence of viral mutation(s) within the areas targeted by this assay, and inadequate number of viral copies(<138 copies/mL). A negative result must be combined with clinical observations, patient history, and epidemiological information. The expected result is Negative.  Fact Sheet for Patients:  EntrepreneurPulse.com.au  Fact Sheet for Healthcare Providers:  IncredibleEmployment.be  This test is no t yet approved or cleared by the [place] FDA and  has been authorized for detection and/or diagnosis of SARS-CoV-2 by FDA under an Emergency Use Authorization (EUA). This EUA will remain  in effect (meaning this test can be used) for the duration of the COVID-19 declaration under Section 564(b)(1) of the Act, 21 U.S.C.section 360bbb-3(b)(1), unless the authorization is terminated  or revoked sooner.       Influenza A by PCR NEGATIVE NEGATIVE Final   Influenza B by PCR NEGATIVE NEGATIVE Final    Comment: (NOTE) The Xpert Xpress SARS-CoV-2/FLU/RSV plus assay is intended as an aid in the diagnosis of influenza from Nasopharyngeal swab specimens and should not be used as a sole basis for treatment. Nasal washings and aspirates are unacceptable for Xpert Xpress SARS-CoV-2/FLU/RSV testing.  Fact Sheet for Patients: EntrepreneurPulse.com.au  Fact Sheet for Healthcare Providers: IncredibleEmployment.be  This test is not yet approved or cleared by the [place] FDA and has been authorized for detection and/or diagnosis of SARS-CoV-2 by FDA under an Emergency Use Authorization (EUA). This EUA will remain in effect (meaning this test can be used)  for the duration of the COVID-19 declaration under Section  564(b)(1) of the Act, 21 U.S.C. section 360bbb-3(b)(1), unless the authorization is terminated or revoked.  Performed at [facility], [place] [address] [place], [place] [address]   Blood Culture (routine x 2)     Status: None (Preliminary result)   Collection Time: 08/06/21 10:37 PM   Specimen: BLOOD LEFT HAND  Result Value Ref Range Status   Specimen Description BLOOD LEFT HAND  Final   Special Requests   Final    BOTTLES DRAWN AEROBIC AND ANAEROBIC Blood Culture results may not be optimal due to an inadequate volume of blood received in culture bottles   Culture   Final    NO GROWTH 3 DAYS Performed at [facility], [place] [address], [place], [place] [address]    Report Status PENDING  Incomplete  Urine Culture     Status: Abnormal   Collection Time: 08/06/21 10:41 PM   Specimen: In/Out Cath Urine  Result Value Ref Range Status   Specimen Description IN/OUT CATH URINE  Final   Special Requests   Final    NONE Performed at [facility], [place] [address], [place], [place] [address]    Culture 70,000 COLONIES/mL KLEBSIELLA PNEUMONIAE (A)  Final   Report Status 08/09/2021 FINAL  Final   Organism ID, Bacteria KLEBSIELLA PNEUMONIAE (A)  Final      Susceptibility   Klebsiella pneumoniae - MIC*    AMPICILLIN >=32 RESISTANT Resistant     CEFAZOLIN <=4 SENSITIVE Sensitive     CEFEPIME <=0.12 SENSITIVE Sensitive     CEFTRIAXONE <=0.25 SENSITIVE Sensitive     CIPROFLOXACIN <=0.25 SENSITIVE Sensitive     GENTAMICIN <=1 SENSITIVE Sensitive     IMIPENEM <=0.25 SENSITIVE Sensitive     NITROFURANTOIN 32 SENSITIVE Sensitive     TRIMETH/SULFA <=20 SENSITIVE Sensitive     AMPICILLIN/SULBACTAM 4 SENSITIVE Sensitive     PIP/TAZO <=4 SENSITIVE Sensitive     * 70,000 COLONIES/mL KLEBSIELLA PNEUMONIAE  Blood Culture (routine x 2)     Status: None (Preliminary result)   Collection Time: 08/06/21 11:00 PM   Specimen: BLOOD  RIGHT ARM  Result Value Ref Range Status   Specimen Description BLOOD RIGHT ARM  Final   Special Requests   Final    BOTTLES DRAWN AEROBIC AND ANAEROBIC Blood Culture adequate volume   Culture   Final    NO GROWTH 3 DAYS Performed at [facility], [address] [place],  [address]    Report Status PENDING  Incomplete    RADIOLOGY STUDIES/RESULTS: No results found.   LOS: 2 days   Oren Binet, MD  [facility]    To contact the attending provider between 7A-7P or the covering provider during after hours 7P-7A, please log into the web site [email or web address] and access using universal [place] password for that web site. If you do not have the password, please call the hospital operator.  08/09/2021, 2:57 PM

## 2021-08-09 NOTE — NC FL2 (Signed)
[place] LEVEL OF CARE SCREENING TOOL     IDENTIFICATION  Patient Name: Katelyn Lamb Birthdate: July 18, 1946 Sex: female Admission Date (Current Location): 08/06/2021  [facility] and [place] Number:  Herbalist and Address:  [facility], [place] [address], [place], [place] [address]      Provider Number: [id number]  Attending Physician Name and Address:  Jonetta Osgood, MD  Relative Name and Phone Number:       Current Level of Care: Hospital Recommended Level of Care: [place] Prior Approval Number:    Date Approved/Denied:   PASRR Number: [id number]  Discharge Plan: SNF    Current Diagnoses: Patient Active Problem List   Diagnosis Date Noted   Acute pulmonary embolism ([place]) 08/07/2021   Abdominal pain 08/07/2021   Elevated troponin 08/07/2021   Lactic acidosis 08/07/2021   Seizure disorder ([place]) 08/07/2021   GERD (gastroesophageal reflux disease) 08/07/2021   Platelet inhibition due to Plavix    Epistaxis s/p embolization 07-18-2021 07/17/2021   Asymptomatic bacteriuria    History of ESBL E. coli infection 07/14/2021   History of ESBL Klebsiella pneumoniae infection 07/14/2021   Generalized weakness 03/15/2021   Acute cystitis with hematuria 03/15/2021   Encephalopathy 03/15/2021   Recurrent falls 03/15/2021   Right pulmonary embolus ([place]) 02/12/2020   Lower extremity weakness 02/02/2020   Macrocytic anemia 02/02/2020   Spinal stenosis 02/02/2020   History of recurrent UTI (urinary tract infection)    Bacterial infection due to Klebsiella pneumoniae    Laceration of left hand 12/09/2019   DNR (do not resuscitate) 12/09/2019   Pelvic fracture ([place]) 12/06/2019   AKI (acute kidney injury) ([place]) 12/06/2019   Urinary tract infection associated with catheterization of urinary tract, initial encounter ([place]) 07/23/2019   Hematuria 07/23/2019   Acute respiratory failure with hypoxia ([place]) 07/23/2019    Hypokalemia 07/23/2019   Acute on chronic anemia 07/23/2019   Fever 07/23/2019   Leukocytosis 07/23/2019   Sepsis ([place]) 07/23/2019   Generalized anxiety disorder 06/17/2019   Menopausal sweats 06/17/2019   Memory difficulty 03/02/2019   Degenerative spondylolisthesis 09/02/2018   Delirium 03/26/2018   Degeneration of lumbar intervertebral disc 11/08/2017   Lumbar radiculopathy 11/06/2017   Chronic neck pain 09/12/2017   Chronic low back pain 09/06/2017   Chronic pain syndrome 09/06/2017   Dilated pancreatic duct 01/24/2017   DVT, lower extremity, distal, chronic ([place]) 01/22/2017   Elevated serum GGT level 01/22/2017   Medication monitoring encounter 01/08/2017   Neurologic gait dysfunction 11/14/2016   Hypotension due to drugs    Urinary retention    Anxiety about health    Reactive depression    Ataxia    Abdominal spasms    Constipation due to pain medication    Acute lower UTI    Dysuria    Acute deep vein thrombosis (DVT) of popliteal vein of left lower extremity (HCC)    Incomplete paraplegia (HCC)    Acute blood loss anemia    Neurogenic bladder    Neuropathic pain    Muscle spasm    Gastroesophageal reflux disease    Slow transit constipation    Thrombocytopenia (HCC) 10/03/2016   Abnormal MRI, spinal cord    Encephalomyelitis    Numbness    Intractable back pain 09/20/2016   Numbness of left lower extremity 09/20/2016   Hyponatremia 09/20/2016   Herpes zoster without complication 24/26/8341   Spondylosis of cervical region without myelopathy or radiculopathy 06/16/2015  Orientation RESPIRATION BLADDER Height & Weight     Self, Time, Situation, Place  Normal Indwelling catheter (suprapubic catheter) Weight: 169 lb 12.1 oz (77 kg) Height:  5\' 7"  (170.2 cm)  BEHAVIORAL SYMPTOMS/MOOD NEUROLOGICAL BOWEL NUTRITION STATUS      Continent Diet (see DC summary)  AMBULATORY STATUS COMMUNICATION OF NEEDS Skin   Extensive Assist Verbally Normal                        Personal Care Assistance Level of Assistance  Bathing, Feeding, Dressing Bathing Assistance: Maximum assistance Feeding assistance: Limited assistance Dressing Assistance: Maximum assistance     Functional Limitations Info             SPECIAL CARE FACTORS FREQUENCY  PT (By licensed PT), OT (By licensed OT)     PT Frequency: 5x/wk OT Frequency: 5x/wk            Contractures Contractures Info: Not present    Additional Factors Info  Code Status, Allergies Code Status Info: DNR Allergies Info: Demerol (Meperidine), Percocet (Oxycodone-acetaminophen), Amoxicillin-pot Clavulanate, Penicillins           Current Medications (08/09/2021):  This is the current hospital active medication list Current Facility-Administered Medications  Medication Dose Route Frequency Provider Last Rate Last Admin   acetaminophen (TYLENOL) tablet 650 mg  650 mg Oral Q6H PRN Howerter, Justin B, DO       Or   acetaminophen (TYLENOL) suppository 650 mg  650 mg Rectal Q6H PRN Howerter, Justin B, DO       acyclovir (ZOVIRAX) tablet 400 mg  400 mg Oral BID Chotiner, Yevonne Aline, MD   400 mg at 08/09/21 5364   apixaban (ELIQUIS) tablet 10 mg  10 mg Oral BID Pham, Minh Q, RPH-CPP   10 mg at 08/09/21 1131   Followed by   Derrill Memo ON 08/13/2021] apixaban (ELIQUIS) tablet 5 mg  5 mg Oral BID Pham, Minh Q, RPH-CPP       diazepam (VALIUM) tablet 5 mg  5 mg Oral Q12H PRN Chotiner, Yevonne Aline, MD       DULoxetine (CYMBALTA) DR capsule 30 mg  30 mg Oral q AM Chotiner, Yevonne Aline, MD   30 mg at 08/09/21 0918   DULoxetine (CYMBALTA) DR capsule 60 mg  60 mg Oral QHS Chotiner, Yevonne Aline, MD   60 mg at 08/08/21 2136   feeding supplement (BOOST / RESOURCE BREEZE) liquid 1 Container  1 Container Oral BID BM Jonetta Osgood, MD   1 Container at 08/09/21 1136   fentaNYL (SUBLIMAZE) injection 25 mcg  25 mcg Intravenous Q2H PRN Howerter, Justin B, DO       levETIRAcetam (KEPPRA) 250 mg in sodium chloride 0.9 % 100  mL IVPB  250 mg Intravenous Q12H Howerter, Justin B, DO   Stopped at 08/09/21 0936   lip balm (CARMEX) ointment 1 application  1 application Topical PRN Aline August, MD       mirabegron ER (MYRBETRIQ) tablet 50 mg  50 mg Oral QHS Chotiner, Yevonne Aline, MD   50 mg at 08/08/21 2137   naloxone [facility]) injection 0.4 mg  0.4 mg Intravenous PRN Howerter, Justin B, DO       pantoprazole (PROTONIX) injection 40 mg  40 mg Intravenous QHS Howerter, Justin B, DO   40 mg at 08/08/21 2137   predniSONE (DELTASONE) tablet 5 mg  5 mg Oral Q breakfast Aline August, MD   5 mg at 08/09/21  1660   QUEtiapine (SEROQUEL) tablet 100 mg  100 mg Oral QHS Chotiner, Yevonne Aline, MD   100 mg at 08/08/21 2136     Discharge Medications: Please see discharge summary for a list of discharge medications.  Relevant Imaging Results:  Relevant Lab Results:   Additional Information SS#: [id number]  Geralynn Ochs, LCSW

## 2021-08-09 NOTE — TOC Benefit Eligibility Note (Signed)
Patient Teacher, English as a foreign language completed.    The patient is currently admitted and upon discharge could be taking Eliquis 5 mg.  The current 30 day co-pay is, $128.97.   The patient is insured through [place], [place] Patient Advocate Specialist [place] Patient Advocate Team Direct Number: [phone]  Fax: [phone]

## 2021-08-09 NOTE — Progress Notes (Signed)
ANTICOAGULATION CONSULT NOTE - Follow Up Consult  Pharmacy Consult for Heparin>apixaban Indication: pulmonary embolus and DVT, RLE. (PTA Xarelto stopped ~3 weeks ago due to nosebleeds and was reversed with Eppie Gibson on 11/15)  Allergies  Allergen Reactions   Demerol [Meperidine] Other (See Comments)    Hallucinations   Percocet [Oxycodone-Acetaminophen] Itching   Amoxicillin-Pot Clavulanate Diarrhea    Severe pain, headache, intestinal infection   Penicillins Itching and Rash    Tolerated amoxicillin November 2022  Has patient had a PCN reaction causing immediate rash, facial/tongue/throat swelling, SOB or lightheadedness with hypotension:  NO Has patient had a PCN reaction causing severe rash involving mucus membranes or skin necrosis: No Has patient had a PCN reaction that required hospitalization: No Has patient had a PCN reaction occurring within the last 10 years: Yes If all of the above answers are "NO", then may proceed with Cephalosporin use.    Patient Measurements: Height: 5\' 7"  (170.2 cm) Weight: 77 kg (169 lb 12.1 oz) IBW/kg (Calculated) : 61.6 Heparin Dosing Weight: 78.2 kg  Vital Signs: Temp: 97.3 F (36.3 C) (12/07 0745) Temp Source: Oral (12/07 0745) BP: 109/73 (12/07 0745) Pulse Rate: 93 (12/07 0745)  Labs: Recent Labs    08/06/21 2225 08/06/21 2330 08/07/21 0052 08/07/21 0500 08/07/21 0740 08/07/21 1140 08/07/21 2129 08/08/21 0038 08/09/21 0156  HGB 12.9   < >  --   --  10.5*  --   --  9.7* 10.6*  HCT 42.2   < >  --   --  34.9*  --   --  32.1* 33.2*  PLT 292  --   --   --  237  --   --  200 190  APTT 28  --   --   --   --   --   --   --   --   LABPROT 13.4  --   --   --   --   --   --   --   --   INR 1.0  --   --   --   --   --   --   --   --   HEPARINUNFRC  --   --   --   --   --    < > 0.67 0.39 0.70  CREATININE 0.74   < >  --  0.67  --   --   --  0.66 0.60  TROPONINIHS 39*  --  48*  --  29*  --   --   --   --    < > = values in this interval  not displayed.     Estimated Creatinine Clearance: 67 mL/min (by C-G formula based on SCr of 0.6 mg/dL).  Assessment: 75 yo F with acute BL segmental and subsegmental PE with mild RHS on 12/5 and RLE DVT per duplex. Patient with DVT hx on rivaroxaban, which caused epistaxis requiring IR embolization and Kcentra reversal 11/15. No anticoagulation prior to admission. Pharmacy consulted for heparin, begun ~2 am on 12/5.  Plan is to transition her to PO apixaban today. She has gotten 3d of IV heparin. We will reduce the duration of loading days.   Goal of Therapy:  Monitor platelets by anticoagulation protocol: Yes   Plan:  Dc heparin Apixaban 10mg  PO BID x4 days then 5mg  PO BID Rx will follow peripherally  Onnie Boer, PharmD, BCIDP, AAHIVP, CPP Infectious Disease Pharmacist 08/09/2021 10:53 AM

## 2021-08-10 LAB — CBC
HCT: 30.8 % — ABNORMAL LOW (ref 36.0–46.0)
Hemoglobin: 9.5 g/dL — ABNORMAL LOW (ref 12.0–15.0)
MCH: 30.3 pg (ref 26.0–34.0)
MCHC: 30.8 g/dL (ref 30.0–36.0)
MCV: 98.1 fL (ref 80.0–100.0)
Platelets: 194 10*3/uL (ref 150–400)
RBC: 3.14 MIL/uL — ABNORMAL LOW (ref 3.87–5.11)
RDW: 17.3 % — ABNORMAL HIGH (ref 11.5–15.5)
WBC: 10 10*3/uL (ref 4.0–10.5)
nRBC: 0 % (ref 0.0–0.2)

## 2021-08-10 MED ORDER — MUPIROCIN 2 % EX OINT: TOPICAL_OINTMENT | Freq: Two times a day (BID) | CUTANEOUS | Status: DC | PRN

## 2021-08-10 MED ORDER — ENSURE ENLIVE PO LIQD
237.0000 mL | Freq: Two times a day (BID) | ORAL | Status: DC
  Administered 2021-08-11 – 2021-08-15 (×8): 237 mL via ORAL

## 2021-08-10 MED ORDER — ADULT MULTIVITAMIN W/MINERALS CH
1.0000 | ORAL_TABLET | Freq: Every day | ORAL | Status: DC
  Administered 2021-08-10 – 2021-08-15 (×6): 1 via ORAL
  Filled 2021-08-10 (×5): qty 1

## 2021-08-10 NOTE — Progress Notes (Signed)
0700 - Report received from PM RN.  All questions answered.  Safety checks performed. Hand hygiene performed before/after each pt contact. 0148 - Assessment/Rx.  Dr. Sloan Leiter rounded on pt.  Dr. Sloan Leiter advised that pt should be ready for discharge soon.  Colletta Maryland, Case Manager notified of same. 1000 - Pt's husband arrived to visit.  Requested to speak with RN. 1030 - Pt's husband concerned about pt's lab values.  Lab values explained.  Pt's husband concerned about where pt will discharge to.  Colletta Maryland, Case Manager notified. 70 - PT worked with pt. 1200 - Pt's daughter arrived to visit. [place], RD assessed pt.  1700 - Pt's food arrived.  Pt able to eat food if cut into bite sized pieces. 71 - Pt's husband arrived and offered to feed her.  Pt advised that she liked it better when he feeds her. 1900 - Report given to PM RN.  All questions answered.

## 2021-08-10 NOTE — Progress Notes (Signed)
PROGRESS NOTE        PATIENT DETAILS Name: Katelyn Lamb Age: 75 y.o. Sex: female Date of Birth: 12/26/1945 Admit Date: 08/06/2021 Admitting Physician Rhetta Mura, DO NOM:VEHMCNO, [place], MD  Brief Narrative: Patient is a 76 y.o. female with history of prior VTE, seizure disorder, HSV encephalomyelitis in 2018 with baseline gait dysfunction requiring a walker-recent hospitalization from 11/11-11/30  UTI and epistaxis requiring intubation and embolization of the left sphenopalatine artery by IR (off Xarelto since then)-presented with shortness of breath-found to have PE and right lower extremity DVT.  See below for further details.  Subjective: Slept well overnight-no major issues overnight.  Tolerating weaning of oxygen-but feels comfortable with 1-2 L.  Denies any abdominal pain or nausea or vomiting.  Last BM was day before yesterday.  Objective: Vitals: Blood pressure 104/65, pulse 98, temperature 98.2 F (36.8 C), temperature source Oral, resp. rate (!) 21, height 5\' 7"  (1.702 m), weight 78 kg, SpO2 (!) 89 %.   Exam: Gen Exam:Alert awake-not in any distress HEENT:atraumatic, normocephalic Chest: B/L clear to auscultation anteriorly CVS:S1S2 regular Abdomen:soft non tender, non distended Extremities:no edema Neurology: Non focal Skin: no rash   Pertinent Labs/Radiology: Recent Labs  Lab 08/06/21 2225 08/06/21 2330 08/07/21 0500 08/07/21 0740 08/09/21 0156 08/10/21 0030  WBC 24.4*  --   --    < > 12.1* 10.0  HGB 12.9   < >  --    < > 10.6* 9.5*  PLT 292  --   --    < > 190 194  NA 136   < > 132*   < > 138  --   K 4.2   < > 3.8   < > 4.1  --   CREATININE 0.74   < > 0.67   < > 0.60  --   AST 28  --  25  --  23  --   ALT 40  --  33  --  25  --   ALKPHOS 109  --  90  --  85  --   BILITOT 0.8  --  0.9  --  0.5  --    < > = values in this interval not displayed.      Assessment/Plan: Acute hypoxic respiratory failure due to PE: Hypoxia  has improved-either on room air or on 1-2 L of oxygen for comfort.  Continue anticoagulation  Bilateral lobar, segmental and subsegmental PE with right lower extremity DVT: Initially on IV heparin-subsequently transition to Eliquis on 12/7.  Seems to be tolerating anticoagulation well without any major bleeding issues.    Note on 12/7-this MD had extensive discussion regarding risks/benefits of long-term anticoagulation with patient/spouse (at bedside)/son (over the phone)-all understand that benefits outweigh risks-all accept risks including catastrophic bleeding.  Subsequently discussed different options of long-term anticoagulation-Eliquis/Xarelto versus Coumadin.  Patient/family elected to try Eliquis.  Portal venous gas in the liver: Seen incidentally on CT imaging-no pneumatosis seen on CT imaging-abdominal exam remains benign-unclear significance of this finding.  Briefly discussed with surgery team-recommendations are to continue with supportive care-given that abdominal exam remains benign and leukocytosis has resolved spontaneously.  Leukocytosis: Possibly reactive-from DVT/PE-no evidence of infection-leukocytosis has resolved with just supportive care and off antimicrobial therapy  Asymptomatic bacteriuria: Likely colonization-continue to monitor off antimicrobial therapy.  Seizure disorder: Continue Keppra  GERD: Continue PPI  History of HSV encephalomyelitis with chronic gait dysfunction: Weakness has worsened due to acute hospitalization-plans of SNF on discharge.  Neurogenic bladder with chronic indwelling suprapubic catheter: Due to sequelae of HSV encephalomyelitis  Chronic steroid use: On usual dosing of prednisone.  BMI Estimated body mass index is 26.93 kg/m as calculated from the following:   Height as of this encounter: 5\' 7"  (1.702 m).   Weight as of this encounter: 78 kg.   Procedures: None Consults: PCCM DVT Prophylaxis:IV heparin>>eliquis Code Status:  DNR Family Communication: Spouse at bedside on 12/8.  Time spent: 25 minutes-Greater than 50% of this time was spent in counseling, explanation of diagnosis, planning of further management, and coordination of care.   Disposition Plan: Status is: Inpatient  Remains inpatient appropriate because: Resolving hypoxia due to large PE.    Diet: Diet Order             DIET SOFT Room service appropriate? Yes with Assist; Fluid consistency: Thin  Diet effective now                     Antimicrobial agents: Anti-infectives (From admission, onward)    Start     Dose/Rate Route Frequency Ordered Stop   08/07/21 2330  acyclovir (ZOVIRAX) tablet 400 mg        400 mg Oral 2 times daily 08/07/21 2233     08/07/21 2200  vancomycin (VANCOREADY) IVPB 1500 mg/300 mL  Status:  Discontinued        1,500 mg 150 mL/hr over 120 Minutes Intravenous Every 24 hours 08/07/21 0128 08/07/21 0546   08/07/21 0600  aztreonam (AZACTAM) 1 g in sodium chloride 0.9 % 100 mL IVPB  Status:  Discontinued        1 g 200 mL/hr over 30 Minutes Intravenous Every 8 hours 08/07/21 0128 08/07/21 0546   08/06/21 2300  vancomycin (VANCOREADY) IVPB 1500 mg/300 mL        1,500 mg 150 mL/hr over 120 Minutes Intravenous  Once 08/06/21 2227 08/07/21 0143   08/06/21 2230  aztreonam (AZACTAM) 2 g in sodium chloride 0.9 % 100 mL IVPB        2 g 200 mL/hr over 30 Minutes Intravenous  Once 08/06/21 2227 08/06/21 2329   08/06/21 2230  metroNIDAZOLE (FLAGYL) IVPB 500 mg        500 mg 100 mL/hr over 60 Minutes Intravenous  Once 08/06/21 2227 08/07/21 0029        MEDICATIONS: Scheduled Meds:  acyclovir  400 mg Oral BID   apixaban  10 mg Oral BID   Followed by   Derrill Memo ON 08/13/2021] apixaban  5 mg Oral BID   dextromethorphan-guaiFENesin  1 tablet Oral BID   DULoxetine  30 mg Oral q AM   DULoxetine  60 mg Oral QHS   feeding supplement  1 Container Oral BID BM   mirabegron ER  50 mg Oral QHS   pantoprazole (PROTONIX)  IV  40 mg Intravenous QHS   predniSONE  5 mg Oral Q breakfast   QUEtiapine  100 mg Oral QHS   Continuous Infusions:  levETIRAcetam 250 mg (08/10/21 0915)   PRN Meds:.acetaminophen **OR** acetaminophen, albuterol, diazepam, fentaNYL (SUBLIMAZE) injection, lip balm, mupirocin ointment, naLOXone (NARCAN)  injection, oxymetazoline, sodium chloride   I have personally reviewed following labs and imaging studies  LABORATORY DATA: CBC: Recent Labs  Lab 08/06/21 2225 08/06/21 2330 08/07/21 0740 08/08/21 0038 08/09/21 0156 08/10/21 0030  WBC 24.4*  --  20.2* 14.6* 12.1* 10.0  NEUTROABS 18.2*  --   --   --   --   --   HGB 12.9 14.6 10.5* 9.7* 10.6* 9.5*  HCT 42.2 43.0 34.9* 32.1* 33.2* 30.8*  MCV 98.8  --  97.8 96.4 96.5 98.1  PLT 292  --  237 200 190 194     Basic Metabolic Panel: Recent Labs  Lab 08/06/21 2225 08/06/21 2330 08/07/21 0500 08/08/21 0038 08/09/21 0156  NA 136 135 132* 135 138  K 4.2 4.1 3.8 3.8 4.1  CL 102 106 100 101 101  CO2 20*  --  23 23 28   GLUCOSE 136* 132* 112* 82 143*  BUN 27* 33* 21 12 11   CREATININE 0.74 0.80 0.67 0.66 0.60  CALCIUM 9.5  --  8.8* 8.2* 8.3*  MG  --   --  1.6* 1.5* 2.2  PHOS  --   --  3.9  --   --      GFR: Estimated Creatinine Clearance: 67.4 mL/min (by C-G formula based on SCr of 0.6 mg/dL).  Liver Function Tests: Recent Labs  Lab 08/06/21 2225 08/07/21 0500 08/09/21 0156  AST 28 25 23   ALT 40 33 25  ALKPHOS 109 90 85  BILITOT 0.8 0.9 0.5  PROT 6.9 5.9* 5.7*  ALBUMIN 3.5 2.9* 2.8*    No results for input(s): LIPASE, AMYLASE in the last 168 hours. No results for input(s): AMMONIA in the last 168 hours.  Coagulation Profile: Recent Labs  Lab 08/06/21 2225  INR 1.0     Cardiac Enzymes: No results for input(s): CKTOTAL, CKMB, CKMBINDEX, TROPONINI in the last 168 hours.  BNP (last 3 results) No results for input(s): PROBNP in the last 8760 hours.  Lipid Profile: No results for input(s): CHOL, HDL,  LDLCALC, TRIG, CHOLHDL, LDLDIRECT in the last 72 hours.  Thyroid Function Tests: No results for input(s): TSH, T4TOTAL, FREET4, T3FREE, THYROIDAB in the last 72 hours.  Anemia Panel: No results for input(s): VITAMINB12, FOLATE, FERRITIN, TIBC, IRON, RETICCTPCT in the last 72 hours.  Urine analysis:    Component Value Date/Time   COLORURINE YELLOW 08/06/2021 2300   APPEARANCEUR CLOUDY (A) 08/06/2021 2300   LABSPEC 1.021 08/06/2021 2300   PHURINE 5.0 08/06/2021 2300   GLUCOSEU NEGATIVE 08/06/2021 2300   HGBUR NEGATIVE 08/06/2021 2300   BILIRUBINUR NEGATIVE 08/06/2021 2300   KETONESUR NEGATIVE 08/06/2021 2300   PROTEINUR 30 (A) 08/06/2021 2300   NITRITE NEGATIVE 08/06/2021 2300   LEUKOCYTESUR MODERATE (A) 08/06/2021 2300    Sepsis Labs: Lactic Acid, Venous    Component Value Date/Time   LATICACIDVEN 1.7 08/07/2021 0556    MICROBIOLOGY: Recent Results (from the past 240 hour(s))  Resp Panel by RT-PCR (Flu A&B, Covid) Nasopharyngeal Swab     Status: None   Collection Time: 08/02/21 11:38 AM   Specimen: Nasopharyngeal Swab; Nasopharyngeal(NP) swabs in vial transport medium  Result Value Ref Range Status   SARS Coronavirus 2 by RT PCR NEGATIVE NEGATIVE Final    Comment: (NOTE) SARS-CoV-2 target nucleic acids are NOT DETECTED.  The SARS-CoV-2 RNA is generally detectable in upper respiratory specimens during the acute phase of infection. The lowest concentration of SARS-CoV-2 viral copies this assay can detect is 138 copies/mL. A negative result does not preclude SARS-Cov-2 infection and should not be used as the sole basis for treatment or other patient management decisions. A negative result may occur with  improper specimen collection/handling, submission of specimen other than nasopharyngeal swab, presence of viral  mutation(s) within the areas targeted by this assay, and inadequate number of viral copies(<138 copies/mL). A negative result must be combined with clinical  observations, patient history, and epidemiological information. The expected result is Negative.  Fact Sheet for Patients:  EntrepreneurPulse.com.au  Fact Sheet for Healthcare Providers:  IncredibleEmployment.be  This test is no t yet approved or cleared by the [place] FDA and  has been authorized for detection and/or diagnosis of SARS-CoV-2 by FDA under an Emergency Use Authorization (EUA). This EUA will remain  in effect (meaning this test can be used) for the duration of the COVID-19 declaration under Section 564(b)(1) of the Act, 21 U.S.C.section 360bbb-3(b)(1), unless the authorization is terminated  or revoked sooner.       Influenza A by PCR NEGATIVE NEGATIVE Final   Influenza B by PCR NEGATIVE NEGATIVE Final    Comment: (NOTE) The Xpert Xpress SARS-CoV-2/FLU/RSV plus assay is intended as an aid in the diagnosis of influenza from Nasopharyngeal swab specimens and should not be used as a sole basis for treatment. Nasal washings and aspirates are unacceptable for Xpert Xpress SARS-CoV-2/FLU/RSV testing.  Fact Sheet for Patients: EntrepreneurPulse.com.au  Fact Sheet for Healthcare Providers: IncredibleEmployment.be  This test is not yet approved or cleared by the [place] FDA and has been authorized for detection and/or diagnosis of SARS-CoV-2 by FDA under an Emergency Use Authorization (EUA). This EUA will remain in effect (meaning this test can be used) for the duration of the COVID-19 declaration under Section 564(b)(1) of the Act, 21 U.S.C. section 360bbb-3(b)(1), unless the authorization is terminated or revoked.  Performed at [facility], [place] [address] [place], [place] [address]   Resp Panel by RT-PCR (Flu A&B, Covid) Nasopharyngeal Swab     Status: None   Collection Time: 08/06/21 10:25 PM   Specimen: Nasopharyngeal Swab; Nasopharyngeal(NP) swabs in vial transport medium   Result Value Ref Range Status   SARS Coronavirus 2 by RT PCR NEGATIVE NEGATIVE Final    Comment: (NOTE) SARS-CoV-2 target nucleic acids are NOT DETECTED.  The SARS-CoV-2 RNA is generally detectable in upper respiratory specimens during the acute phase of infection. The lowest concentration of SARS-CoV-2 viral copies this assay can detect is 138 copies/mL. A negative result does not preclude SARS-Cov-2 infection and should not be used as the sole basis for treatment or other patient management decisions. A negative result may occur with  improper specimen collection/handling, submission of specimen other than nasopharyngeal swab, presence of viral mutation(s) within the areas targeted by this assay, and inadequate number of viral copies(<138 copies/mL). A negative result must be combined with clinical observations, patient history, and epidemiological information. The expected result is Negative.  Fact Sheet for Patients:  EntrepreneurPulse.com.au  Fact Sheet for Healthcare Providers:  IncredibleEmployment.be  This test is no t yet approved or cleared by the [place] FDA and  has been authorized for detection and/or diagnosis of SARS-CoV-2 by FDA under an Emergency Use Authorization (EUA). This EUA will remain  in effect (meaning this test can be used) for the duration of the COVID-19 declaration under Section 564(b)(1) of the Act, 21 U.S.C.section 360bbb-3(b)(1), unless the authorization is terminated  or revoked sooner.       Influenza A by PCR NEGATIVE NEGATIVE Final   Influenza B by PCR NEGATIVE NEGATIVE Final    Comment: (NOTE) The Xpert Xpress SARS-CoV-2/FLU/RSV plus assay is intended as an aid in the diagnosis of influenza from Nasopharyngeal swab specimens and should not be used as  a sole basis for treatment. Nasal washings and aspirates are unacceptable for Xpert Xpress SARS-CoV-2/FLU/RSV testing.  Fact Sheet for  Patients: EntrepreneurPulse.com.au  Fact Sheet for Healthcare Providers: IncredibleEmployment.be  This test is not yet approved or cleared by the [place] FDA and has been authorized for detection and/or diagnosis of SARS-CoV-2 by FDA under an Emergency Use Authorization (EUA). This EUA will remain in effect (meaning this test can be used) for the duration of the COVID-19 declaration under Section 564(b)(1) of the Act, 21 U.S.C. section 360bbb-3(b)(1), unless the authorization is terminated or revoked.  Performed at [facility], [place] [address] [place], [place] [address]   Blood Culture (routine x 2)     Status: None (Preliminary result)   Collection Time: 08/06/21 10:37 PM   Specimen: BLOOD LEFT HAND  Result Value Ref Range Status   Specimen Description BLOOD LEFT HAND  Final   Special Requests   Final    BOTTLES DRAWN AEROBIC AND ANAEROBIC Blood Culture results may not be optimal due to an inadequate volume of blood received in culture bottles   Culture   Final    NO GROWTH 4 DAYS Performed at [facility], [place] [address], [place], [place] [address]    Report Status PENDING  Incomplete  Urine Culture     Status: Abnormal   Collection Time: 08/06/21 10:41 PM   Specimen: In/Out Cath Urine  Result Value Ref Range Status   Specimen Description IN/OUT CATH URINE  Final   Special Requests   Final    NONE Performed at [facility], [place] [address] [place], [place] [address]    Culture 70,000 COLONIES/mL KLEBSIELLA PNEUMONIAE (A)  Final   Report Status 08/09/2021 FINAL  Final   Organism ID, Bacteria KLEBSIELLA PNEUMONIAE (A)  Final      Susceptibility   Klebsiella pneumoniae - MIC*    AMPICILLIN >=32 RESISTANT Resistant     CEFAZOLIN <=4 SENSITIVE Sensitive     CEFEPIME <=0.12 SENSITIVE Sensitive     CEFTRIAXONE <=0.25 SENSITIVE Sensitive     CIPROFLOXACIN <=0.25 SENSITIVE Sensitive     GENTAMICIN <=1 SENSITIVE  Sensitive     IMIPENEM <=0.25 SENSITIVE Sensitive     NITROFURANTOIN 32 SENSITIVE Sensitive     TRIMETH/SULFA <=20 SENSITIVE Sensitive     AMPICILLIN/SULBACTAM 4 SENSITIVE Sensitive     PIP/TAZO <=4 SENSITIVE Sensitive     * 70,000 COLONIES/mL KLEBSIELLA PNEUMONIAE  Blood Culture (routine x 2)     Status: None (Preliminary result)   Collection Time: 08/06/21 11:00 PM   Specimen: BLOOD RIGHT ARM  Result Value Ref Range Status   Specimen Description BLOOD RIGHT ARM  Final   Special Requests   Final    BOTTLES DRAWN AEROBIC AND ANAEROBIC Blood Culture adequate volume   Culture   Final    NO GROWTH 4 DAYS Performed at [facility], [address], [place], [place] [address]    Report Status PENDING  Incomplete    RADIOLOGY STUDIES/RESULTS: No results found.   LOS: 3 days   Oren Binet, MD  [facility]    To contact the attending provider between 7A-7P or the covering provider during after hours 7P-7A, please log into the web site [email or web address] and access using universal [place] password for that web site. If you do not have the password, please call the hospital operator.  08/10/2021, 11:41 AM

## 2021-08-10 NOTE — Progress Notes (Addendum)
Initial Nutrition Assessment  DOCUMENTATION CODES:   Not applicable  INTERVENTION:   Recommend liberalizing pt diet to regular due to poor PO intake. Received ok from MD. Encourage good PO intake  Discontinue Boost Breeze Ensure Enlive po BID, each supplement provides 350 kcal and 20 grams of protein Multivitamin w/ minerals daily  NUTRITION DIAGNOSIS:   Increased nutrient needs related to acute illness as evidenced by estimated needs.  GOAL:   Patient will meet greater than or equal to 90% of their needs  MONITOR:   PO intake, Supplement acceptance, Labs, Weight trends  REASON FOR ASSESSMENT:   Other (Comment) (Supplements)    ASSESSMENT:   75 y.o. female presented to the Ed with SOB. PMH includes GERD. Pt admitted with acute pulmonary embolism and acute hypoxic respiratory distress.   Pt resting in bed, family at bedside. Family able to provide majority of nutrition related information.  Pt reports that she doesn't eat right at home and that she doesn't eat much. Pt and family reports that she will go on binges of eating one thing for a  while until she is burnt out on it. Pt reports that she drinks one Boost for breakfast each day; reports that she will have sandwich's on occasion. Pt lives with her husband at home.  Family reports that pt has difficulty using her hands and that she would need assistance eating if they were not present. RD informed RN that pt will need help feeding.  Pt denies any difficulty chewing or swallowing.   Pt unable to provide UBW. Denies any weight loss, states that she has been gaining weight. Per EMR, pt has not had any weight loss within the past year.   Pt was recently at a facility; family states that she had not been getting up much there with therapy.  Medications reviewed and include: Protonix, Prednisone Labs reviewed.  NUTRITION - FOCUSED PHYSICAL EXAM:  Flowsheet Row Most Recent Value  Orbital Region No depletion  Upper Arm  Region No depletion  Thoracic and Lumbar Region No depletion  Buccal Region No depletion  Temple Region Mild depletion  Clavicle Bone Region No depletion  Clavicle and Acromion Bone Region No depletion  Scapular Bone Region No depletion  Dorsal Hand No depletion  Patellar Region Moderate depletion  Anterior Thigh Region Moderate depletion  Posterior Calf Region Moderate depletion  Edema (RD Assessment) Mild  Hair Reviewed  Eyes Reviewed  Mouth Reviewed  Skin Reviewed  Nails Reviewed       Diet Order:   Diet Order             Diet regular Room service appropriate? Yes with Assist; Fluid consistency: Thin  Diet effective now                   EDUCATION NEEDS:   No education needs have been identified at this time  Skin:  Skin Assessment: Reviewed RN Assessment  Last BM:  08/08/2021  Height:   Ht Readings from Last 1 Encounters:  08/07/21 5\' 7"  (1.702 m)    Weight:   Wt Readings from Last 1 Encounters:  08/10/21 78 kg    Ideal Body Weight:  61.2 kg  BMI:  Body mass index is 26.93 kg/m.  Estimated Nutritional Needs:   Kcal:  1700-1900  Protein:  85-100 gram  Fluid:  > 1.7 L    Katelyn Lamb, RD, LDN Clinical Dietitian See [facility] for contact information.

## 2021-08-10 NOTE — Evaluation (Signed)
Occupational Therapy Evaluation Patient Details Name: Katelyn Lamb MRN: [id number] DOB: August 22, 1946 Today's Date: 08/10/2021   History of Present Illness 75 y.o. female who is admitted to [facility] on 08/06/2021 with acute bilateral pulmonary emboli with suggestion of cor pulmonale after presenting to [facility] ED complaining of shortness of breath. Pt recently on Eliquis for LE DVT but was on hold due to epistaxis requiring cauterization to stop bleeding. 08/07/21 +RLE DVT  PMH- significant for prior DVT, seizure disorder, GERD   Clinical Impression   Patient admitted for above and limited by problem list below, including impaired cognition, generalized weakness, decreased sensation and coordination in BUEs, and decreased activity tolerance.  Patient currently requires up to total assist for bed level toileting and LB ADLS, mod- max assist for UB ADLs and min-mod assist for grooming.  She is disoriented to situation, follows simple commands with increased time and has poor awareness, problem solving.  She will benefit from continued OT services while admitted and after dc at SNF level to optimize return to PLOF and decrease burden of care upon return home.  Will follow.      Recommendations for follow up therapy are one component of a multi-disciplinary discharge planning process, led by the attending physician.  Recommendations may be updated based on patient status, additional functional criteria and insurance authorization.   Follow Up Recommendations  Skilled nursing-short term rehab (<3 hours/day)    Assistance Recommended at Discharge Frequent or constant Supervision/Assistance  Functional Status Assessment  Patient has had a recent decline in their functional status and/or demonstrates limited ability to make significant improvements in function in a reasonable and predictable amount of time  Equipment Recommendations       Recommendations for Other Services       Precautions /  Restrictions Precautions Precautions: Fall Precaution Comments: suprapubic catheter Restrictions Weight Bearing Restrictions: No      Mobility Bed Mobility Overal bed mobility: Needs Assistance Bed Mobility: Rolling Rolling: Max assist;Mod assist         General bed mobility comments: initally max assist to roll to L and R side, progressed to mod assist with cueing for technique during hygiene and bed pad change    Transfers                   General transfer comment: deferred      Balance                                           ADL either performed or assessed with clinical judgement   ADL Overall ADL's : Needs assistance/impaired     Grooming: Minimal assistance;Bed level Grooming Details (indicate cue type and reason): able to wash face and min assist to open chapstick Upper Body Bathing: Moderate assistance;Bed level   Lower Body Bathing: Total assistance;Bed level   Upper Body Dressing : Maximal assistance;Bed level   Lower Body Dressing: Total assistance;Bed level     Toilet Transfer Details (indicate cue type and reason): deferred Toileting- Clothing Manipulation and Hygiene: Total assistance;+2 for physical assistance;Bed level Toileting - Clothing Manipulation Details (indicate cue type and reason): spouse assisting minimally, total assist for hygiene and bed pad change with pt unaware of BM             Vision   Vision Assessment?: No apparent visual deficits     Perception  Praxis      Pertinent Vitals/Pain Pain Assessment: No/denies pain     Hand Dominance Right   Extremity/Trunk Assessment Upper Extremity Assessment Upper Extremity Assessment: Generalized weakness;RUE deficits/detail;LUE deficits/detail RUE Deficits / Details: WFL AROM, grossly 3/5 MMT. decreased coordination and sensation at baseline RUE Sensation: decreased light touch RUE Coordination: decreased fine motor;decreased gross motor LUE  Deficits / Details: WFL AROM, grossly 3/5 MMT with decreased sesnation and coordination (at baseline) LUE Sensation: decreased light touch;decreased proprioception LUE Coordination: decreased fine motor;decreased gross motor   Lower Extremity Assessment Lower Extremity Assessment: Defer to PT evaluation       Communication Communication Communication: No difficulties   Cognition Arousal/Alertness: Awake/alert Behavior During Therapy: WFL for tasks assessed/performed Overall Cognitive Status: Impaired/Different from baseline Area of Impairment: Orientation;Attention;Memory;Following commands;Safety/judgement;Awareness;Problem solving                 Orientation Level: Disoriented to;Situation Current Attention Level: Sustained Memory: Decreased short-term memory Following Commands: Follows one step commands with increased time Safety/Judgement: Decreased awareness of safety;Decreased awareness of deficits Awareness: Intellectual Problem Solving: Slow processing;Decreased initiation;Difficulty sequencing;Requires verbal cues General Comments: pt disoriented to situation, unaware she had a BM in bed; follows simple commands with increased time but poor recall, awareness and problem solving     General Comments  VSS on 2L [place]    Exercises     Shoulder Instructions      Home Living Family/patient expects to be discharged to:: Skilled nursing facility                                        Prior Functioning/Environment Prior Level of Function : Needs assist  Cognitive Assist : ADLs (cognitive)   ADLs (Cognitive): Step by step cues (spouse reports varies daily but at times needs increased assist cognitively) Physical Assist : Mobility (physical);ADLs (physical) Mobility (physical): Bed mobility;Transfers;Gait ADLs (physical): Feeding;Grooming;Bathing;Dressing;Toileting;IADLs Mobility Comments: prior to last hospitalization, pt was requiring assist for all  transfers and limited ambulation; while last hospitalized, pt required +2 max to stand with stedy lift ADLs Comments: prior to last hospital stay required some assist for ADLs, since able to self feed some but requires assist for other ADL's        OT Problem List: Decreased strength;Decreased activity tolerance;Impaired balance (sitting and/or standing);Decreased coordination;Decreased safety awareness;Decreased knowledge of precautions;Pain;Decreased cognition;Decreased range of motion;Impaired sensation      OT Treatment/Interventions: Self-care/ADL training;Energy conservation;DME and/or AE instruction;Therapeutic activities;Cognitive remediation/compensation;Patient/family education;Balance training;Therapeutic exercise    OT Goals(Current goals can be found in the care plan section) Acute Rehab OT Goals Patient Stated Goal: to get stronger OT Goal Formulation: With patient/family Time For Goal Achievement: 08/24/21 Potential to Achieve Goals: Fair  OT Frequency: Min 2X/week   Barriers to D/C:            Co-evaluation              AM-PAC OT "6 Clicks" Daily Activity     Outcome Measure Help from another person eating meals?: A Lot Help from another person taking care of personal grooming?: A Lot Help from another person toileting, which includes using toliet, bedpan, or urinal?: Total Help from another person bathing (including washing, rinsing, drying)?: A Lot Help from another person to put on and taking off regular upper body clothing?: A Lot Help from another person to put on and taking off regular lower body clothing?:  Total 6 Click Score: 10   End of Session Equipment Utilized During Treatment: Oxygen (2L) Nurse Communication: Mobility status;Other (comment) (+ BM in bed, pt unaware)  Activity Tolerance: Patient tolerated treatment well Patient left: in bed;with call bell/phone within reach;with bed alarm set;with family/visitor present  OT Visit Diagnosis:  Other abnormalities of gait and mobility (R26.89);Muscle weakness (generalized) (M62.81);Feeding difficulties (R63.3)                Time: 3685-9923 OT Time Calculation (min): 45 min Charges:  OT General Charges $OT Visit: 1 Visit OT Evaluation $OT Eval Moderate Complexity: 1 Mod OT Treatments $Self Care/Home Management : 23-37 mins  Jolaine Artist, OT Acute Rehabilitation Services Pager [phone] Office [phone]   Delight Stare 08/10/2021, 12:27 PM

## 2021-08-11 ENCOUNTER — Inpatient Hospital Stay (HOSPITAL_COMMUNITY): Payer: PPO

## 2021-08-11 LAB — CULTURE, BLOOD (ROUTINE X 2)
Culture: NO GROWTH
Culture: NO GROWTH
Special Requests: ADEQUATE

## 2021-08-11 LAB — CBC
HCT: 33.7 % — ABNORMAL LOW (ref 36.0–46.0)
Hemoglobin: 10.3 g/dL — ABNORMAL LOW (ref 12.0–15.0)
MCH: 30.5 pg (ref 26.0–34.0)
MCHC: 30.6 g/dL (ref 30.0–36.0)
MCV: 99.7 fL (ref 80.0–100.0)
Platelets: 185 10*3/uL (ref 150–400)
RBC: 3.38 MIL/uL — ABNORMAL LOW (ref 3.87–5.11)
RDW: 17.7 % — ABNORMAL HIGH (ref 11.5–15.5)
WBC: 11.9 10*3/uL — ABNORMAL HIGH (ref 4.0–10.5)
nRBC: 0.4 % — ABNORMAL HIGH (ref 0.0–0.2)

## 2021-08-11 MED ORDER — ALBUTEROL SULFATE (2.5 MG/3ML) 0.083% IN NEBU: 2.5000 mg | INHALATION_SOLUTION | RESPIRATORY_TRACT | Status: DC | PRN

## 2021-08-11 MED ORDER — IPRATROPIUM-ALBUTEROL 0.5-2.5 (3) MG/3ML IN SOLN
3.0000 mL | Freq: Four times a day (QID) | RESPIRATORY_TRACT | Status: DC
  Administered 2021-08-11 – 2021-08-12 (×6): 3 mL via RESPIRATORY_TRACT
  Filled 2021-08-11 (×7): qty 3

## 2021-08-11 MED ORDER — BENZONATATE 100 MG PO CAPS
200.0000 mg | ORAL_CAPSULE | Freq: Three times a day (TID) | ORAL | Status: DC
  Administered 2021-08-11 – 2021-08-15 (×15): 200 mg via ORAL
  Filled 2021-08-11 (×15): qty 2

## 2021-08-11 NOTE — Progress Notes (Signed)
Physical Therapy Treatment Patient Details Name: Katelyn Lamb MRN: [id number] DOB: 1946/01/08 Today's Date: 08/11/2021   History of Present Illness 75 y.o. female who is admitted to [facility] on 08/06/2021 with acute bilateral pulmonary emboli with suggestion of cor pulmonale after presenting to [facility] ED complaining of shortness of breath. Pt recently on Eliquis for LE DVT but was on hold due to epistaxis requiring cauterization to stop bleeding. 08/07/21 +RLE DVT  PMH- significant for prior DVT, seizure disorder, GERD    PT Comments    Pt with much improved mobility and activity tolerance today. Motivated and participatory with all aspects of treatment session. Worked on sitting balance and trunk strengthening and progressed to standing. Hopeful to continue focusing on bed mobility, balance, and transfers to increase pt's independence and decr the burden of care for hopeful eventual return home. Currently continue to recommend ST-SNF for further rehab.    Recommendations for follow up therapy are one component of a multi-disciplinary discharge planning process, led by the attending physician.  Recommendations may be updated based on patient status, additional functional criteria and insurance authorization.  Follow Up Recommendations  Skilled nursing-short term rehab (<3 hours/day)     Assistance Recommended at Discharge Frequent or [facility] bed;Other (comment) (hoyer lift)    Recommendations for Other Services       Precautions / Restrictions Precautions Precautions: Fall;Other (comment) Precaution Comments: suprapubic catheter Restrictions Weight Bearing Restrictions: No     Mobility  Bed Mobility Overal bed mobility: Needs Assistance Bed Mobility: Rolling;Supine to Sit;Sit to Supine Rolling: Mod assist   Supine to sit: +2 for physical assistance;Mod assist Sit to supine: +2 for physical assistance;Max assist    General bed mobility comments: Assist to bring shoulders over to roll. Pt able to initiate bringing legs off EOB. Assist to elevate trunk and bring hips to EOB. Assist to lower trunk and bring legs back up into bed returning to supine    Transfers Overall transfer level: Needs assistance Equipment used: Ambulation equipment used Charlaine Dalton) Transfers: Sit to/from Stand Sit to Stand: +2 physical assistance;Mod assist           General transfer comment: Assist using bed pad to bring hips up. Verbal/tactile cues to extend hips/trunk in standing. From bed +2 mod assist to stand. From seat of Stedy +2 min assist to stand. Transfer via Lift Equipment: Stedy  Ambulation/Gait             Pre-gait activities: Stood in [place] with mod assist ~30 sec working on hip/trunk Physicist, medical [place] (Stroke Patients Only)       Balance Overall balance assessment: Needs assistance Sitting-balance support: Feet supported;Single extremity supported Sitting balance-Leahy Scale: Poor Sitting balance - Comments: UE support and min guard with intermittent verbal cues to correct posterior and lt lean. Sat x 15 minutes Postural control: Posterior lean;Left lateral lean Standing balance support: Bilateral upper extremity supported Standing balance-Leahy Scale: Poor Standing balance comment: Stedy and mod assist for static standing                            Cognition Arousal/Alertness: Awake/alert Behavior During Therapy: WFL for tasks assessed/performed Overall Cognitive Status: Impaired/Different from baseline Area of Impairment: Memory;Problem solving;Following commands  Memory: Decreased short-term memory Following Commands: Follows one step commands with increased time     Problem Solving: Slow processing;Requires verbal cues;Decreased initiation          Exercises      General  Comments General comments (skin integrity, edema, etc.): SpO2, HR, and BP all stable on 2L O2      Pertinent Vitals/Pain Pain Assessment: No/denies pain    Home Living                          Prior Function            PT Goals (current goals can now be found in the care plan section) Acute Rehab PT Goals Patient Stated Goal: get stronger and go home PT Goal Formulation: With patient Time For Goal Achievement: 08/25/21 Potential to Achieve Goals: Fair Progress towards PT goals: Goals met and updated - see care plan    Frequency    Min 2X/week      PT Plan Current plan remains appropriate    Co-evaluation              AM-PAC PT "6 Clicks" Mobility   Outcome Measure  Help needed turning from your back to your side while in a flat bed without using bedrails?: A Lot Help needed moving from lying on your back to sitting on the side of a flat bed without using bedrails?: Total Help needed moving to and from a bed to a chair (including a wheelchair)?: Total Help needed standing up from a chair using your arms (e.g., wheelchair or bedside chair)?: Total Help needed to walk in hospital room?: Total Help needed climbing 3-5 steps with a railing? : Total 6 Click Score: 7    End of Session Equipment Utilized During Treatment: Oxygen Activity Tolerance: Patient tolerated treatment well Patient left: in bed;with call bell/phone within reach;with bed alarm set;with family/visitor present Nurse Communication: Mobility status;Need for lift equipment PT Visit Diagnosis: Muscle weakness (generalized) (M62.81);Difficulty in walking, not elsewhere classified (R26.2)     Time: 8288-3374 PT Time Calculation (min) (ACUTE ONLY): 34 min  Charges:  $Therapeutic Activity: 23-37 mins                     [place] Pager [phone] Office [place] 08/11/2021, 10:46 AM

## 2021-08-11 NOTE — Progress Notes (Signed)
   08/11/21 0000  Assess: MEWS Score  Temp 98.9 F (37.2 C)  BP 117/76  Pulse Rate 100  ECG Heart Rate (!) 101  Resp (!) 23  SpO2 93 %  O2 Device Nasal Cannula  O2 Flow Rate (L/min) 3 L/min  Assess: MEWS Score  MEWS Temp 0  MEWS Systolic 0  MEWS Pulse 1  MEWS RR 1  MEWS LOC 0  MEWS Score 2  MEWS Score Color Yellow  Assess: if the MEWS score is Yellow or Red  Were vital signs taken at a resting state? Yes  Focused Assessment No change from prior assessment  Early Detection of Sepsis Score *See Row Information* Medium  MEWS guidelines implemented *See Row Information* No, previously yellow, continue vital signs every 4 hours

## 2021-08-11 NOTE — TOC Progression Note (Addendum)
Transition of Care [facility]) - Progression Note    Patient Details  Name: Katelyn Lamb MRN: [id number] Date of Birth: Mar 23, 1946  [facility]) CM/SW [place], LCSW Phone Number: 08/11/2021, 1:42 PM  Clinical Narrative:    11am-MD completing peer to peer with [facility] for SNF. Patient's spouse given other SNF facility options.  1:47pm-CSW spoke with patient's spouse to get SNF decision. He asked if [facility] had a private room available. [place] checking. Still waiting on insurance auth.   4pm- Spouse returned call and stated they will return to [facility] even though it is not a private room. CSW received SNF approval from [facility] for [facility], [phone] for 7 days. Ambulance Josem Kaufmann [phone]. CSW made [facility] aware that patient will discharge tomorrow and requested their admissions liaison contact patient's spouse to answer questions.    Expected Discharge Plan: [place] Barriers to Discharge: Continued Medical Work up, Ship broker  Expected Discharge Plan and Services Expected Discharge Plan: [place] Choice: [place] arrangements for the past 2 months: Single Family Home                                       Social Determinants of Health (SDOH) Interventions    Readmission Risk Interventions No flowsheet data found.

## 2021-08-11 NOTE — Progress Notes (Signed)
PROGRESS NOTE        PATIENT DETAILS Name: Katelyn Lamb Age: 75 y.o. Sex: female Date of Birth: 01-30-1946 Admit Date: 08/06/2021 Admitting Physician Rhetta Mura, DO IEP:PIRJJOA, [place], MD  Brief Narrative: Patient is a 75 y.o. female with history of prior VTE, seizure disorder, HSV encephalomyelitis in 2018 with baseline gait dysfunction requiring a walker-recent hospitalization from 11/11-11/30  UTI and epistaxis requiring intubation and embolization of the left sphenopalatine artery by IR (off Xarelto since then)-presented with shortness of breath-found to have PE and right lower extremity DVT.  See below for further details.  Subjective: Some cough-but no major events overnight.  Objective: Vitals: Blood pressure 105/76, pulse 99, temperature 98 F (36.7 C), temperature source Axillary, resp. rate 20, height 5\' 7"  (1.702 m), weight 76.1 kg, SpO2 97 %.   Exam: Gen Exam:Alert awake-not in any distress HEENT:atraumatic, normocephalic Chest: Few scattered rhonchi all over. CVS:S1S2 regular Abdomen:soft non tender, non distended Extremities:no edema Neurology: Non focal Skin: no rash   Pertinent Labs/Radiology: Recent Labs  Lab 08/06/21 2225 08/06/21 2330 08/07/21 0500 08/07/21 0740 08/09/21 0156 08/10/21 0030 08/11/21 0120  WBC 24.4*  --   --    < > 12.1*   < > 11.9*  HGB 12.9   < >  --    < > 10.6*   < > 10.3*  PLT 292  --   --    < > 190   < > 185  NA 136   < > 132*   < > 138  --   --   K 4.2   < > 3.8   < > 4.1  --   --   CREATININE 0.74   < > 0.67   < > 0.60  --   --   AST 28  --  25  --  23  --   --   ALT 40  --  33  --  25  --   --   ALKPHOS 109  --  90  --  85  --   --   BILITOT 0.8  --  0.9  --  0.5  --   --    < > = values in this interval not displayed.      Assessment/Plan: Acute hypoxic respiratory failure due to PE: Overall much better-hypoxia has improved-on only 1-2 L of oxygen-mostly for comfort.  Continue  anticoagulation.  Some wheezing today-probably atelectasis-CXR negative for pneumonia or pulmonary edema-encourage use of incentive spirometry/flutter valve/out of bed to chair-90 degree angle while eating.  Bilateral lobar, segmental and subsegmental PE with right lower extremity DVT: Initially on IV heparin-subsequently transition to Eliquis on 12/7.  Seems to be tolerating anticoagulation well without any major bleeding issues.    Note on 12/7-this MD had extensive discussion regarding risks/benefits of long-term anticoagulation with patient/spouse (at bedside)/son (over the phone)-all understand that benefits outweigh risks-all accept risks including catastrophic bleeding.  Subsequently discussed different options of long-term anticoagulation-Eliquis/Xarelto versus Coumadin.  Patient/family elected to try Eliquis.  Portal venous gas in the liver: Seen incidentally on CT imaging-no pneumatosis seen on CT imaging-abdominal exam remains benign-unclear significance of this finding.  Briefly discussed with surgery team-recommendations are to continue with supportive care-given that abdominal exam remains benign and leukocytosis has resolved spontaneously.  Leukocytosis: Possibly reactive-from DVT/PE-no evidence of infection-leukocytosis  has resolved with just supportive care and off antimicrobial therapy  Asymptomatic bacteriuria: Likely colonization-continue to monitor off antimicrobial therapy.  Seizure disorder: Continue Keppra  GERD: Continue PPI  History of HSV encephalomyelitis with chronic gait dysfunction: Weakness has worsened due to acute hospitalization-plans of SNF on discharge.  Neurogenic bladder with chronic indwelling suprapubic catheter: Due to sequelae of HSV encephalomyelitis  Chronic steroid use: On usual dosing of prednisone.  Per patient/family this was started by her neurologist several months ago for her underlying history of encephalomyelitis.  BMI Estimated body mass  index is 26.28 kg/m as calculated from the following:   Height as of this encounter: 5\' 7"  (1.702 m).   Weight as of this encounter: 76.1 kg.   Procedures: None Consults: PCCM DVT Prophylaxis:IV heparin>>eliquis Code Status: DNR Family Communication: Daughter at bedside on 12/8.  Time spent: 25 minutes-Greater than 50% of this time was spent in counseling, explanation of diagnosis, planning of further management, and coordination of care.   Disposition Plan: Status is: Inpatient  Remains inpatient appropriate because: Resolving hypoxia due to large PE.  Awaiting SNF-have placed call to insurance MD for peer to peer-I have left a voicemail    Diet: Diet Order             Diet regular Room service appropriate? Yes with Assist; Fluid consistency: Thin  Diet effective now                     Antimicrobial agents: Anti-infectives (From admission, onward)    Start     Dose/Rate Route Frequency Ordered Stop   08/07/21 2330  acyclovir (ZOVIRAX) tablet 400 mg        400 mg Oral 2 times daily 08/07/21 2233     08/07/21 2200  vancomycin (VANCOREADY) IVPB 1500 mg/300 mL  Status:  Discontinued        1,500 mg 150 mL/hr over 120 Minutes Intravenous Every 24 hours 08/07/21 0128 08/07/21 0546   08/07/21 0600  aztreonam (AZACTAM) 1 g in sodium chloride 0.9 % 100 mL IVPB  Status:  Discontinued        1 g 200 mL/hr over 30 Minutes Intravenous Every 8 hours 08/07/21 0128 08/07/21 0546   08/06/21 2300  vancomycin (VANCOREADY) IVPB 1500 mg/300 mL        1,500 mg 150 mL/hr over 120 Minutes Intravenous  Once 08/06/21 2227 08/07/21 0143   08/06/21 2230  aztreonam (AZACTAM) 2 g in sodium chloride 0.9 % 100 mL IVPB        2 g 200 mL/hr over 30 Minutes Intravenous  Once 08/06/21 2227 08/06/21 2329   08/06/21 2230  metroNIDAZOLE (FLAGYL) IVPB 500 mg        500 mg 100 mL/hr over 60 Minutes Intravenous  Once 08/06/21 2227 08/07/21 0029        MEDICATIONS: Scheduled Meds:  acyclovir   400 mg Oral BID   apixaban  10 mg Oral BID   Followed by   Derrill Memo ON 08/13/2021] apixaban  5 mg Oral BID   benzonatate  200 mg Oral TID   dextromethorphan-guaiFENesin  1 tablet Oral BID   DULoxetine  30 mg Oral q AM   DULoxetine  60 mg Oral QHS   feeding supplement  237 mL Oral BID BM   ipratropium-albuterol  3 mL Nebulization Q6H   mirabegron ER  50 mg Oral QHS   multivitamin with minerals  1 tablet Oral Daily   pantoprazole (PROTONIX) IV  40 mg Intravenous QHS   predniSONE  5 mg Oral Q breakfast   QUEtiapine  100 mg Oral QHS   Continuous Infusions:  levETIRAcetam 250 mg (08/11/21 0827)   PRN Meds:.acetaminophen **OR** acetaminophen, albuterol, diazepam, fentaNYL (SUBLIMAZE) injection, lip balm, mupirocin ointment, naLOXone (NARCAN)  injection, oxymetazoline, sodium chloride   I have personally reviewed following labs and imaging studies  LABORATORY DATA: CBC: Recent Labs  Lab 08/06/21 2225 08/06/21 2330 08/07/21 0740 08/08/21 0038 08/09/21 0156 08/10/21 0030 08/11/21 0120  WBC 24.4*  --  20.2* 14.6* 12.1* 10.0 11.9*  NEUTROABS 18.2*  --   --   --   --   --   --   HGB 12.9   < > 10.5* 9.7* 10.6* 9.5* 10.3*  HCT 42.2   < > 34.9* 32.1* 33.2* 30.8* 33.7*  MCV 98.8  --  97.8 96.4 96.5 98.1 99.7  PLT 292  --  237 200 190 194 185   < > = values in this interval not displayed.     Basic Metabolic Panel: Recent Labs  Lab 08/06/21 2225 08/06/21 2330 08/07/21 0500 08/08/21 0038 08/09/21 0156  NA 136 135 132* 135 138  K 4.2 4.1 3.8 3.8 4.1  CL 102 106 100 101 101  CO2 20*  --  23 23 28   GLUCOSE 136* 132* 112* 82 143*  BUN 27* 33* 21 12 11   CREATININE 0.74 0.80 0.67 0.66 0.60  CALCIUM 9.5  --  8.8* 8.2* 8.3*  MG  --   --  1.6* 1.5* 2.2  PHOS  --   --  3.9  --   --      GFR: Estimated Creatinine Clearance: 66.6 mL/min (by C-G formula based on SCr of 0.6 mg/dL).  Liver Function Tests: Recent Labs  Lab 08/06/21 2225 08/07/21 0500 08/09/21 0156  AST 28 25  23   ALT 40 33 25  ALKPHOS 109 90 85  BILITOT 0.8 0.9 0.5  PROT 6.9 5.9* 5.7*  ALBUMIN 3.5 2.9* 2.8*    No results for input(s): LIPASE, AMYLASE in the last 168 hours. No results for input(s): AMMONIA in the last 168 hours.  Coagulation Profile: Recent Labs  Lab 08/06/21 2225  INR 1.0     Cardiac Enzymes: No results for input(s): CKTOTAL, CKMB, CKMBINDEX, TROPONINI in the last 168 hours.  BNP (last 3 results) No results for input(s): PROBNP in the last 8760 hours.  Lipid Profile: No results for input(s): CHOL, HDL, LDLCALC, TRIG, CHOLHDL, LDLDIRECT in the last 72 hours.  Thyroid Function Tests: No results for input(s): TSH, T4TOTAL, FREET4, T3FREE, THYROIDAB in the last 72 hours.  Anemia Panel: No results for input(s): VITAMINB12, FOLATE, FERRITIN, TIBC, IRON, RETICCTPCT in the last 72 hours.  Urine analysis:    Component Value Date/Time   COLORURINE YELLOW 08/06/2021 2300   APPEARANCEUR CLOUDY (A) 08/06/2021 2300   LABSPEC 1.021 08/06/2021 2300   PHURINE 5.0 08/06/2021 2300   GLUCOSEU NEGATIVE 08/06/2021 2300   HGBUR NEGATIVE 08/06/2021 2300   BILIRUBINUR NEGATIVE 08/06/2021 2300   KETONESUR NEGATIVE 08/06/2021 2300   PROTEINUR 30 (A) 08/06/2021 2300   NITRITE NEGATIVE 08/06/2021 2300   LEUKOCYTESUR MODERATE (A) 08/06/2021 2300    Sepsis Labs: Lactic Acid, Venous    Component Value Date/Time   LATICACIDVEN 1.7 08/07/2021 0556    MICROBIOLOGY: Recent Results (from the past 240 hour(s))  Resp Panel by RT-PCR (Flu A&B, Covid) Nasopharyngeal Swab     Status: None   Collection Time: 08/02/21  11:38 AM   Specimen: Nasopharyngeal Swab; Nasopharyngeal(NP) swabs in vial transport medium  Result Value Ref Range Status   SARS Coronavirus 2 by RT PCR NEGATIVE NEGATIVE Final    Comment: (NOTE) SARS-CoV-2 target nucleic acids are NOT DETECTED.  The SARS-CoV-2 RNA is generally detectable in upper respiratory specimens during the acute phase of infection. The  lowest concentration of SARS-CoV-2 viral copies this assay can detect is 138 copies/mL. A negative result does not preclude SARS-Cov-2 infection and should not be used as the sole basis for treatment or other patient management decisions. A negative result may occur with  improper specimen collection/handling, submission of specimen other than nasopharyngeal swab, presence of viral mutation(s) within the areas targeted by this assay, and inadequate number of viral copies(<138 copies/mL). A negative result must be combined with clinical observations, patient history, and epidemiological information. The expected result is Negative.  Fact Sheet for Patients:  EntrepreneurPulse.com.au  Fact Sheet for Healthcare Providers:  IncredibleEmployment.be  This test is no t yet approved or cleared by the [place] FDA and  has been authorized for detection and/or diagnosis of SARS-CoV-2 by FDA under an Emergency Use Authorization (EUA). This EUA will remain  in effect (meaning this test can be used) for the duration of the COVID-19 declaration under Section 564(b)(1) of the Act, 21 U.S.C.section 360bbb-3(b)(1), unless the authorization is terminated  or revoked sooner.       Influenza A by PCR NEGATIVE NEGATIVE Final   Influenza B by PCR NEGATIVE NEGATIVE Final    Comment: (NOTE) The Xpert Xpress SARS-CoV-2/FLU/RSV plus assay is intended as an aid in the diagnosis of influenza from Nasopharyngeal swab specimens and should not be used as a sole basis for treatment. Nasal washings and aspirates are unacceptable for Xpert Xpress SARS-CoV-2/FLU/RSV testing.  Fact Sheet for Patients: EntrepreneurPulse.com.au  Fact Sheet for Healthcare Providers: IncredibleEmployment.be  This test is not yet approved or cleared by the [place] FDA and has been authorized for detection and/or diagnosis of SARS-CoV-2 by FDA under  an Emergency Use Authorization (EUA). This EUA will remain in effect (meaning this test can be used) for the duration of the COVID-19 declaration under Section 564(b)(1) of the Act, 21 U.S.C. section 360bbb-3(b)(1), unless the authorization is terminated or revoked.  Performed at [facility], [place] [address], [place], [place] [address]   Resp Panel by RT-PCR (Flu A&B, Covid) Nasopharyngeal Swab     Status: None   Collection Time: 08/06/21 10:25 PM   Specimen: Nasopharyngeal Swab; Nasopharyngeal(NP) swabs in vial transport medium  Result Value Ref Range Status   SARS Coronavirus 2 by RT PCR NEGATIVE NEGATIVE Final    Comment: (NOTE) SARS-CoV-2 target nucleic acids are NOT DETECTED.  The SARS-CoV-2 RNA is generally detectable in upper respiratory specimens during the acute phase of infection. The lowest concentration of SARS-CoV-2 viral copies this assay can detect is 138 copies/mL. A negative result does not preclude SARS-Cov-2 infection and should not be used as the sole basis for treatment or other patient management decisions. A negative result may occur with  improper specimen collection/handling, submission of specimen other than nasopharyngeal swab, presence of viral mutation(s) within the areas targeted by this assay, and inadequate number of viral copies(<138 copies/mL). A negative result must be combined with clinical observations, patient history, and epidemiological information. The expected result is Negative.  Fact Sheet for Patients:  EntrepreneurPulse.com.au  Fact Sheet for Healthcare Providers:  IncredibleEmployment.be  This test is no t yet approved  or cleared by the [place] and  has been authorized for detection and/or diagnosis of SARS-CoV-2 by FDA under an Emergency Use Authorization (EUA). This EUA will remain  in effect (meaning this test can be used) for the duration of the COVID-19 declaration under  Section 564(b)(1) of the Act, 21 U.S.C.section 360bbb-3(b)(1), unless the authorization is terminated  or revoked sooner.       Influenza A by PCR NEGATIVE NEGATIVE Final   Influenza B by PCR NEGATIVE NEGATIVE Final    Comment: (NOTE) The Xpert Xpress SARS-CoV-2/FLU/RSV plus assay is intended as an aid in the diagnosis of influenza from Nasopharyngeal swab specimens and should not be used as a sole basis for treatment. Nasal washings and aspirates are unacceptable for Xpert Xpress SARS-CoV-2/FLU/RSV testing.  Fact Sheet for Patients: EntrepreneurPulse.com.au  Fact Sheet for Healthcare Providers: IncredibleEmployment.be  This test is not yet approved or cleared by the [place] FDA and has been authorized for detection and/or diagnosis of SARS-CoV-2 by FDA under an Emergency Use Authorization (EUA). This EUA will remain in effect (meaning this test can be used) for the duration of the COVID-19 declaration under Section 564(b)(1) of the Act, 21 U.S.C. section 360bbb-3(b)(1), unless the authorization is terminated or revoked.  Performed at [facility], [place] [address] [place], [place] [address]   Blood Culture (routine x 2)     Status: None   Collection Time: 08/06/21 10:37 PM   Specimen: BLOOD LEFT HAND  Result Value Ref Range Status   Specimen Description BLOOD LEFT HAND  Final   Special Requests   Final    BOTTLES DRAWN AEROBIC AND ANAEROBIC Blood Culture results may not be optimal due to an inadequate volume of blood received in culture bottles   Culture   Final    NO GROWTH 5 DAYS Performed at [facility], [place] [address] [place], [place] [address]    Report Status 08/11/2021 FINAL  Final  Urine Culture     Status: Abnormal   Collection Time: 08/06/21 10:41 PM   Specimen: In/Out Cath Urine  Result Value Ref Range Status   Specimen Description IN/OUT CATH URINE  Final   Special Requests   Final    NONE Performed  at [facility], [place] [address], [place], [place] [address]    Culture 70,000 COLONIES/mL KLEBSIELLA PNEUMONIAE (A)  Final   Report Status 08/09/2021 FINAL  Final   Organism ID, Bacteria KLEBSIELLA PNEUMONIAE (A)  Final      Susceptibility   Klebsiella pneumoniae - MIC*    AMPICILLIN >=32 RESISTANT Resistant     CEFAZOLIN <=4 SENSITIVE Sensitive     CEFEPIME <=0.12 SENSITIVE Sensitive     CEFTRIAXONE <=0.25 SENSITIVE Sensitive     CIPROFLOXACIN <=0.25 SENSITIVE Sensitive     GENTAMICIN <=1 SENSITIVE Sensitive     IMIPENEM <=0.25 SENSITIVE Sensitive     NITROFURANTOIN 32 SENSITIVE Sensitive     TRIMETH/SULFA <=20 SENSITIVE Sensitive     AMPICILLIN/SULBACTAM 4 SENSITIVE Sensitive     PIP/TAZO <=4 SENSITIVE Sensitive     * 70,000 COLONIES/mL KLEBSIELLA PNEUMONIAE  Blood Culture (routine x 2)     Status: None   Collection Time: 08/06/21 11:00 PM   Specimen: BLOOD RIGHT ARM  Result Value Ref Range Status   Specimen Description BLOOD RIGHT ARM  Final   Special Requests   Final    BOTTLES DRAWN AEROBIC AND ANAEROBIC Blood Culture adequate volume   Culture  Final    NO GROWTH 5 DAYS Performed at [facility], [place] [address] [place], [place] [address]    Report Status 08/11/2021 FINAL  Final    RADIOLOGY STUDIES/RESULTS: DG Chest Port 1V same Day  Result Date: 08/11/2021 CLINICAL DATA:  Shortness of breath EXAM: PORTABLE CHEST 1 VIEW COMPARISON:  08/06/2021 FINDINGS: The heart size and mediastinal contours are within normal limits. Both lungs are clear. The visualized skeletal structures are unremarkable. IMPRESSION: No acute abnormality of the lungs in AP portable projection. Electronically Signed   By: Delanna Ahmadi M.D.   On: 08/11/2021 09:52     LOS: 4 days   Oren Binet, MD  [facility]    To contact the attending provider between 7A-7P or the covering provider during after hours 7P-7A, please log into the web site [email or web address] and access using  universal [place] password for that web site. If you do not have the password, please call the hospital operator.  08/11/2021, 12:25 PM

## 2021-08-12 ENCOUNTER — Inpatient Hospital Stay (HOSPITAL_COMMUNITY): Payer: PPO

## 2021-08-12 LAB — BASIC METABOLIC PANEL
Anion gap: 10 (ref 5–15)
Anion gap: 11 (ref 5–15)
BUN: 14 mg/dL (ref 8–23)
BUN: 14 mg/dL (ref 8–23)
CO2: 27 mmol/L (ref 22–32)
CO2: 26 mmol/L (ref 22–32)
Calcium: 9.1 mg/dL (ref 8.9–10.3)
Calcium: 9.2 mg/dL (ref 8.9–10.3)
Chloride: 97 mmol/L — ABNORMAL LOW (ref 98–111)
Chloride: 99 mmol/L (ref 98–111)
Creatinine, Ser: 0.76 mg/dL (ref 0.44–1.00)
Creatinine, Ser: 0.8 mg/dL (ref 0.44–1.00)
GFR, Estimated: >60 mL/min (ref >60)
GFR, Estimated: >60 mL/min (ref >60)
Glucose, Bld: 133 mg/dL — ABNORMAL HIGH (ref 70–99)
Glucose, Bld: 147 mg/dL — ABNORMAL HIGH (ref 70–99)
Potassium: 4.2 mmol/L (ref 3.5–5.1)
Potassium: 4.2 mmol/L (ref 3.5–5.1)
Sodium: 134 mmol/L — ABNORMAL LOW (ref 135–145)
Sodium: 136 mmol/L (ref 135–145)

## 2021-08-12 LAB — CBC
HCT: 34.7 % — ABNORMAL LOW (ref 36.0–46.0)
HCT: 35.3 % — ABNORMAL LOW (ref 36.0–46.0)
Hemoglobin: 10.8 g/dL — ABNORMAL LOW (ref 12.0–15.0)
Hemoglobin: 11 g/dL — ABNORMAL LOW (ref 12.0–15.0)
MCH: 30.2 pg (ref 26.0–34.0)
MCH: 30.1 pg (ref 26.0–34.0)
MCHC: 31.1 g/dL (ref 30.0–36.0)
MCHC: 31.2 g/dL (ref 30.0–36.0)
MCV: 96.9 fL (ref 80.0–100.0)
MCV: 96.4 fL (ref 80.0–100.0)
Platelets: 203 10*3/uL (ref 150–400)
Platelets: 211 10*3/uL (ref 150–400)
RBC: 3.58 MIL/uL — ABNORMAL LOW (ref 3.87–5.11)
RBC: 3.66 MIL/uL — ABNORMAL LOW (ref 3.87–5.11)
RDW: 17.6 % — ABNORMAL HIGH (ref 11.5–15.5)
RDW: 17.8 % — ABNORMAL HIGH (ref 11.5–15.5)
WBC: 14.1 10*3/uL — ABNORMAL HIGH (ref 4.0–10.5)
WBC: 14.7 10*3/uL — ABNORMAL HIGH (ref 4.0–10.5)
nRBC: 0.1 % (ref 0.0–0.2)
nRBC: 0.1 % (ref 0.0–0.2)

## 2021-08-12 LAB — BRAIN NATRIURETIC PEPTIDE: B Natriuretic Peptide: 16.5 pg/mL (ref 0.0–100.0)

## 2021-08-12 LAB — PROCALCITONIN
Procalcitonin: <0.1 ng/mL
Procalcitonin: <0.1 ng/mL

## 2021-08-12 MED ORDER — GUAIFENESIN ER 600 MG PO TB12
1200.0000 mg | ORAL_TABLET | Freq: Two times a day (BID) | ORAL | Status: DC
  Administered 2021-08-12 – 2021-08-15 (×7): 1200 mg via ORAL
  Filled 2021-08-12 (×7): qty 2

## 2021-08-12 MED ORDER — SODIUM CHLORIDE 0.9 % IV SOLN
2.0000 g | Freq: Three times a day (TID) | INTRAVENOUS | Status: DC
  Administered 2021-08-12 – 2021-08-15 (×9): 2 g via INTRAVENOUS
  Filled 2021-08-12 (×10): qty 2

## 2021-08-12 MED ORDER — PANTOPRAZOLE SODIUM 40 MG PO TBEC
40.0000 mg | DELAYED_RELEASE_TABLET | Freq: Every day | ORAL | Status: DC
  Administered 2021-08-12 – 2021-08-15 (×4): 40 mg via ORAL
  Filled 2021-08-12 (×4): qty 1

## 2021-08-12 MED ORDER — VANCOMYCIN HCL 1500 MG/300ML IV SOLN
1500.0000 mg | INTRAVENOUS | Status: DC
  Filled 2021-08-12: qty 300

## 2021-08-12 MED ORDER — VANCOMYCIN HCL 1500 MG/300ML IV SOLN
1500.0000 mg | Freq: Once | INTRAVENOUS | Status: AC
  Administered 2021-08-12: 1500 mg via INTRAVENOUS
  Filled 2021-08-12: qty 300

## 2021-08-12 MED ORDER — LEVETIRACETAM 250 MG PO TABS
250.0000 mg | ORAL_TABLET | Freq: Two times a day (BID) | ORAL | Status: DC
  Administered 2021-08-12 – 2021-08-15 (×7): 250 mg via ORAL
  Filled 2021-08-12 (×7): qty 1

## 2021-08-12 NOTE — Plan of Care (Signed)
  Problem: Clinical Measurements: Goal: Diagnostic test results will improve Outcome: Progressing   Problem: Clinical Measurements: Goal: Respiratory complications will improve Outcome: Progressing   Problem: Activity: Goal: Risk for activity intolerance will decrease Outcome: Progressing   

## 2021-08-12 NOTE — Progress Notes (Signed)
Pharmacy Antibiotic Note  Katelyn Lamb is a 75 y.o. female admitted on 08/06/2021 with pneumonia.  Pharmacy has been consulted for cefepime and vancomycin dosing.  WBC 14.1 (on prednisone), PCT <0.1, temp 36.3, Scr 0.76 (CrCl 66 mL/min). CXR showing mild bibasilar atelectasis with progression. Has PCN allergy but has tolerated amoxicillin and other cephalosporins in the past.   Plan: Vancomycin 1500 mg IV every 24 hours (estAUC 498) Cefepime 2g IV every 8 hours Monitor renal fx, cx results, clinical pic  Height: 5\' 7"  (170.2 cm) Weight: 76.1 kg (167 lb 12.3 oz) IBW/kg (Calculated) : 61.6  Temp (24hrs), Avg:97.9 F (36.6 C), Min:97.3 F (36.3 C), Max:98.7 F (37.1 C)  Recent Labs  Lab 08/06/21 2300 08/06/21 2330 08/07/21 0500 08/07/21 0556 08/07/21 0740 08/08/21 0038 08/09/21 0156 08/10/21 0030 08/11/21 0120 08/12/21 1111  WBC  --   --   --   --    < > 14.6* 12.1* 10.0 11.9* 14.1*  CREATININE  --  0.80 0.67  --   --  0.66 0.60  --   --  0.76  LATICACIDVEN 2.0*  --   --  1.7  --   --   --   --   --   --    < > = values in this interval not displayed.    Estimated Creatinine Clearance: 66.6 mL/min (by C-G formula based on SCr of 0.76 mg/dL).    Allergies  Allergen Reactions   Demerol [Meperidine] Other (See Comments)    Hallucinations   Percocet [Oxycodone-Acetaminophen] Itching   Amoxicillin-Pot Clavulanate Diarrhea    Severe pain, headache, intestinal infection   Penicillins Itching and Rash    Tolerated amoxicillin November 2022  Has patient had a PCN reaction causing immediate rash, facial/tongue/throat swelling, SOB or lightheadedness with hypotension:  NO Has patient had a PCN reaction causing severe rash involving mucus membranes or skin necrosis: No Has patient had a PCN reaction that required hospitalization: No Has patient had a PCN reaction occurring within the last 10 years: Yes If all of the above answers are "NO", then may proceed with Cephalosporin use.     Antimicrobials this admission: Azactam/Flagyl 12/4 x1 Vancomycin 12/4, 12/10>> Cefepime 12/10>>  Acyclovir 400 BID as PTA  Dose adjustments this admission: N/A  Microbiology results: 12/4 BCx: neg 12/4 Ucx: 70 K/ml Klebsiella, sens to all except R to Amp 12/4 COVID and flu: neg 12/10 Bcx: sent   Thank you for allowing pharmacy to be a part of this patient's care.  Antonietta Jewel, PharmD, [facility] Clinical Pharmacist  Phone: [phone] 08/12/2021 3:33 PM  Please check AMION for all [place] phone numbers After 10:00 PM, call [place] [phone]

## 2021-08-12 NOTE — Progress Notes (Addendum)
PROGRESS NOTE        PATIENT DETAILS Name: Katelyn Lamb Age: 75 y.o. Sex: female Date of Birth: 1945-10-24 Admit Date: 08/06/2021 Admitting Physician Rhetta Mura, DO WCB:[id number], [place], MD  Brief Narrative: Patient is a 75 y.o. female with history of prior VTE, seizure disorder, HSV encephalomyelitis in 2018 with baseline gait dysfunction requiring a walker-recent hospitalization from 11/11-11/30  UTI and epistaxis requiring intubation and embolization of the left sphenopalatine artery by IR (off Xarelto since then)-presented with shortness of breath-found to have PE and right lower extremity DVT.  See below for further details.  Subjective: Appears comfortable-but continues to have these coughing spells-appears to have significant congestion in the chest today.  Objective: Vitals: Blood pressure 98/82, pulse (!) 123, temperature (!) 97.3 F (36.3 C), temperature source Oral, resp. rate 20, height 5\' 7"  (1.702 m), weight 76.1 kg, SpO2 95 %.   Exam: Gen Exam:Alert awake-not in any distress.  Appears weak and chronically sick appearing. HEENT:atraumatic, normocephalic Chest: Transmitted upper airway sounds-coarse crackles and bibasilar areas. CVS:S1S2 regular Abdomen:soft non tender, non distended Extremities:no edema Neurology: Generalized weakness-but nonfocal. Skin: no rash   Pertinent Labs/Radiology: Recent Labs  Lab 08/06/21 2225 08/06/21 2330 08/07/21 0500 08/07/21 0740 08/09/21 0156 08/10/21 0030 08/11/21 0120  WBC 24.4*  --   --    < > 12.1*   < > 11.9*  HGB 12.9   < >  --    < > 10.6*   < > 10.3*  PLT 292  --   --    < > 190   < > 185  NA 136   < > 132*   < > 138  --   --   K 4.2   < > 3.8   < > 4.1  --   --   CREATININE 0.74   < > 0.67   < > 0.60  --   --   AST 28  --  25  --  23  --   --   ALT 40  --  33  --  25  --   --   ALKPHOS 109  --  90  --  85  --   --   BILITOT 0.8  --  0.9  --  0.5  --   --    < > = values in this  interval not displayed.      Assessment/Plan: Acute hypoxic respiratory failure due to PE: Minimal oxygen requirement-but better than how she first presented-tolerating anticoagulation well.  However now developing cough/chest congestion-concerned that she may be developing PNA-repeat CXR today.  We will ask SLP to evaluate as well.  Continue bronchodilator use/Mucinex/incentive spirometry/flutter valve-we will asked nursing staff to see if it is possible to get her out of bed to a chair as well.  Patient and family aware that she needs to be sitting up and eating.  Addendum: Chest x-ray shows progression of basilar infiltrates-suspect she may have aspirated-we will treat as healthcare associated pneumonia-start vancomycin/Zosyn.  Bilateral lobar, segmental and subsegmental PE with right lower extremity DVT: Initially on IV heparin-subsequently transition to Eliquis on 12/7.  Seems to be tolerating anticoagulation well without any major bleeding issues.    Note on 12/7-this MD had extensive discussion regarding risks/benefits of long-term anticoagulation with patient/spouse (at bedside)/son (over the phone)-all understand  that benefits outweigh risks-all accept risks including catastrophic bleeding.  Subsequently discussed different options of long-term anticoagulation-Eliquis/Xarelto versus Coumadin.  Patient/family elected to try Eliquis.  Portal venous gas in the liver: Seen incidentally on CT imaging-no pneumatosis seen on CT imaging-abdominal exam remains benign-unclear significance of this finding.  Briefly discussed with surgery team-recommendations are to continue with supportive care-given that abdominal exam remains benign and leukocytosis has resolved spontaneously.  Leukocytosis: Possibly reactive-from DVT/PE-no evidence of infection-leukocytosis has resolved with just supportive care and off antimicrobial therapy  Asymptomatic bacteriuria: Likely colonization-continue to monitor off  antimicrobial therapy.  Seizure disorder: Continue Keppra  GERD: Continue PPI  History of HSV encephalomyelitis with chronic gait dysfunction: Weakness has worsened due to acute hospitalization-plans of SNF on discharge.  Neurogenic bladder with chronic indwelling suprapubic catheter: Due to sequelae of HSV encephalomyelitis  Chronic steroid use: On usual dosing of prednisone.  Per patient/family this was started by her neurologist several months ago for her underlying history of encephalomyelitis.  BMI Estimated body mass index is 26.28 kg/m as calculated from the following:   Height as of this encounter: 5\' 7"  (1.702 m).   Weight as of this encounter: 76.1 kg.   Procedures: None Consults: PCCM DVT Prophylaxis:IV heparin>>eliquis Code Status: DNR Family Communication: Spouse at bedside on 12/10.  Time spent: 35 minutes-Greater than 50% of this time was spent in counseling, explanation of diagnosis, planning of further management, and coordination of care.   Disposition Plan: Status is: Inpatient  Remains inpatient appropriate because: Resolving hypoxia due to large PE.  Awaiting SNF-have placed call to insurance MD for peer to peer-I have left a voicemail    Diet: Diet Order             Diet regular Room service appropriate? Yes with Assist; Fluid consistency: Thin  Diet effective now                     Antimicrobial agents: Anti-infectives (From admission, onward)    Start     Dose/Rate Route Frequency Ordered Stop   08/07/21 2330  acyclovir (ZOVIRAX) tablet 400 mg        400 mg Oral 2 times daily 08/07/21 2233     08/07/21 2200  vancomycin (VANCOREADY) IVPB 1500 mg/300 mL  Status:  Discontinued        1,500 mg 150 mL/hr over 120 Minutes Intravenous Every 24 hours 08/07/21 0128 08/07/21 0546   08/07/21 0600  aztreonam (AZACTAM) 1 g in sodium chloride 0.9 % 100 mL IVPB  Status:  Discontinued        1 g 200 mL/hr over 30 Minutes Intravenous Every 8 hours  08/07/21 0128 08/07/21 0546   08/06/21 2300  vancomycin (VANCOREADY) IVPB 1500 mg/300 mL        1,500 mg 150 mL/hr over 120 Minutes Intravenous  Once 08/06/21 2227 08/07/21 0143   08/06/21 2230  aztreonam (AZACTAM) 2 g in sodium chloride 0.9 % 100 mL IVPB        2 g 200 mL/hr over 30 Minutes Intravenous  Once 08/06/21 2227 08/06/21 2329   08/06/21 2230  metroNIDAZOLE (FLAGYL) IVPB 500 mg        500 mg 100 mL/hr over 60 Minutes Intravenous  Once 08/06/21 2227 08/07/21 0029        MEDICATIONS: Scheduled Meds:  acyclovir  400 mg Oral BID   apixaban  10 mg Oral BID   Followed by   Derrill Memo ON 08/13/2021] apixaban  5  mg Oral BID   benzonatate  200 mg Oral TID   dextromethorphan-guaiFENesin  1 tablet Oral BID   DULoxetine  30 mg Oral q AM   DULoxetine  60 mg Oral QHS   feeding supplement  237 mL Oral BID BM   ipratropium-albuterol  3 mL Nebulization Q6H   mirabegron ER  50 mg Oral QHS   multivitamin with minerals  1 tablet Oral Daily   pantoprazole (PROTONIX) IV  40 mg Intravenous QHS   predniSONE  5 mg Oral Q breakfast   QUEtiapine  100 mg Oral QHS   Continuous Infusions:  levETIRAcetam 250 mg (08/12/21 0935)   PRN Meds:.acetaminophen **OR** acetaminophen, albuterol, diazepam, fentaNYL (SUBLIMAZE) injection, lip balm, mupirocin ointment, naLOXone (NARCAN)  injection, oxymetazoline, sodium chloride   I have personally reviewed following labs and imaging studies  LABORATORY DATA: CBC: Recent Labs  Lab 08/06/21 2225 08/06/21 2330 08/07/21 0740 08/08/21 0038 08/09/21 0156 08/10/21 0030 08/11/21 0120  WBC 24.4*  --  20.2* 14.6* 12.1* 10.0 11.9*  NEUTROABS 18.2*  --   --   --   --   --   --   HGB 12.9   < > 10.5* 9.7* 10.6* 9.5* 10.3*  HCT 42.2   < > 34.9* 32.1* 33.2* 30.8* 33.7*  MCV 98.8  --  97.8 96.4 96.5 98.1 99.7  PLT 292  --  237 200 190 194 185   < > = values in this interval not displayed.     Basic Metabolic Panel: Recent Labs  Lab 08/06/21 2225  08/06/21 2330 08/07/21 0500 08/08/21 0038 08/09/21 0156  NA 136 135 132* 135 138  K 4.2 4.1 3.8 3.8 4.1  CL 102 106 100 101 101  CO2 20*  --  23 23 28   GLUCOSE 136* 132* 112* 82 143*  BUN 27* 33* 21 12 11   CREATININE 0.74 0.80 0.67 0.66 0.60  CALCIUM 9.5  --  8.8* 8.2* 8.3*  MG  --   --  1.6* 1.5* 2.2  PHOS  --   --  3.9  --   --      GFR: Estimated Creatinine Clearance: 66.6 mL/min (by C-G formula based on SCr of 0.6 mg/dL).  Liver Function Tests: Recent Labs  Lab 08/06/21 2225 08/07/21 0500 08/09/21 0156  AST 28 25 23   ALT 40 33 25  ALKPHOS 109 90 85  BILITOT 0.8 0.9 0.5  PROT 6.9 5.9* 5.7*  ALBUMIN 3.5 2.9* 2.8*    No results for input(s): LIPASE, AMYLASE in the last 168 hours. No results for input(s): AMMONIA in the last 168 hours.  Coagulation Profile: Recent Labs  Lab 08/06/21 2225  INR 1.0     Cardiac Enzymes: No results for input(s): CKTOTAL, CKMB, CKMBINDEX, TROPONINI in the last 168 hours.  BNP (last 3 results) No results for input(s): PROBNP in the last 8760 hours.  Lipid Profile: No results for input(s): CHOL, HDL, LDLCALC, TRIG, CHOLHDL, LDLDIRECT in the last 72 hours.  Thyroid Function Tests: No results for input(s): TSH, T4TOTAL, FREET4, T3FREE, THYROIDAB in the last 72 hours.  Anemia Panel: No results for input(s): VITAMINB12, FOLATE, FERRITIN, TIBC, IRON, RETICCTPCT in the last 72 hours.  Urine analysis:    Component Value Date/Time   COLORURINE YELLOW 08/06/2021 2300   APPEARANCEUR CLOUDY (A) 08/06/2021 2300   LABSPEC 1.021 08/06/2021 2300   PHURINE 5.0 08/06/2021 2300   GLUCOSEU NEGATIVE 08/06/2021 2300   HGBUR NEGATIVE 08/06/2021 2300   BILIRUBINUR NEGATIVE 08/06/2021  [place] 08/06/2021 2300   PROTEINUR 30 (A) 08/06/2021 2300   NITRITE NEGATIVE 08/06/2021 2300   LEUKOCYTESUR MODERATE (A) 08/06/2021 2300    Sepsis Labs: Lactic Acid, Venous    Component Value Date/Time   LATICACIDVEN 1.7 08/07/2021  0556    MICROBIOLOGY: Recent Results (from the past 240 hour(s))  Resp Panel by RT-PCR (Flu A&B, Covid) Nasopharyngeal Swab     Status: None   Collection Time: 08/02/21 11:38 AM   Specimen: Nasopharyngeal Swab; Nasopharyngeal(NP) swabs in vial transport medium  Result Value Ref Range Status   SARS Coronavirus 2 by RT PCR NEGATIVE NEGATIVE Final    Comment: (NOTE) SARS-CoV-2 target nucleic acids are NOT DETECTED.  The SARS-CoV-2 RNA is generally detectable in upper respiratory specimens during the acute phase of infection. The lowest concentration of SARS-CoV-2 viral copies this assay can detect is 138 copies/mL. A negative result does not preclude SARS-Cov-2 infection and should not be used as the sole basis for treatment or other patient management decisions. A negative result may occur with  improper specimen collection/handling, submission of specimen other than nasopharyngeal swab, presence of viral mutation(s) within the areas targeted by this assay, and inadequate number of viral copies(<138 copies/mL). A negative result must be combined with clinical observations, patient history, and epidemiological information. The expected result is Negative.  Fact Sheet for Patients:  EntrepreneurPulse.com.au  Fact Sheet for Healthcare Providers:  IncredibleEmployment.be  This test is no t yet approved or cleared by the [place] FDA and  has been authorized for detection and/or diagnosis of SARS-CoV-2 by FDA under an Emergency Use Authorization (EUA). This EUA will remain  in effect (meaning this test can be used) for the duration of the COVID-19 declaration under Section 564(b)(1) of the Act, 21 U.S.C.section 360bbb-3(b)(1), unless the authorization is terminated  or revoked sooner.       Influenza A by PCR NEGATIVE NEGATIVE Final   Influenza B by PCR NEGATIVE NEGATIVE Final    Comment: (NOTE) The Xpert Xpress SARS-CoV-2/FLU/RSV plus  assay is intended as an aid in the diagnosis of influenza from Nasopharyngeal swab specimens and should not be used as a sole basis for treatment. Nasal washings and aspirates are unacceptable for Xpert Xpress SARS-CoV-2/FLU/RSV testing.  Fact Sheet for Patients: EntrepreneurPulse.com.au  Fact Sheet for Healthcare Providers: IncredibleEmployment.be  This test is not yet approved or cleared by the [place] FDA and has been authorized for detection and/or diagnosis of SARS-CoV-2 by FDA under an Emergency Use Authorization (EUA). This EUA will remain in effect (meaning this test can be used) for the duration of the COVID-19 declaration under Section 564(b)(1) of the Act, 21 U.S.C. section 360bbb-3(b)(1), unless the authorization is terminated or revoked.  Performed at [facility], [place] [address], [place], [place] [address]   Resp Panel by RT-PCR (Flu A&B, Covid) Nasopharyngeal Swab     Status: None   Collection Time: 08/06/21 10:25 PM   Specimen: Nasopharyngeal Swab; Nasopharyngeal(NP) swabs in vial transport medium  Result Value Ref Range Status   SARS Coronavirus 2 by RT PCR NEGATIVE NEGATIVE Final    Comment: (NOTE) SARS-CoV-2 target nucleic acids are NOT DETECTED.  The SARS-CoV-2 RNA is generally detectable in upper respiratory specimens during the acute phase of infection. The lowest concentration of SARS-CoV-2 viral copies this assay can detect is 138 copies/mL. A negative result does not preclude SARS-Cov-2 infection and should not be used as the sole basis for treatment or other patient  management decisions. A negative result may occur with  improper specimen collection/handling, submission of specimen other than nasopharyngeal swab, presence of viral mutation(s) within the areas targeted by this assay, and inadequate number of viral copies(<138 copies/mL). A negative result must be combined with clinical observations,  patient history, and epidemiological information. The expected result is Negative.  Fact Sheet for Patients:  EntrepreneurPulse.com.au  Fact Sheet for Healthcare Providers:  IncredibleEmployment.be  This test is no t yet approved or cleared by the [place] FDA and  has been authorized for detection and/or diagnosis of SARS-CoV-2 by FDA under an Emergency Use Authorization (EUA). This EUA will remain  in effect (meaning this test can be used) for the duration of the COVID-19 declaration under Section 564(b)(1) of the Act, 21 U.S.C.section 360bbb-3(b)(1), unless the authorization is terminated  or revoked sooner.       Influenza A by PCR NEGATIVE NEGATIVE Final   Influenza B by PCR NEGATIVE NEGATIVE Final    Comment: (NOTE) The Xpert Xpress SARS-CoV-2/FLU/RSV plus assay is intended as an aid in the diagnosis of influenza from Nasopharyngeal swab specimens and should not be used as a sole basis for treatment. Nasal washings and aspirates are unacceptable for Xpert Xpress SARS-CoV-2/FLU/RSV testing.  Fact Sheet for Patients: EntrepreneurPulse.com.au  Fact Sheet for Healthcare Providers: IncredibleEmployment.be  This test is not yet approved or cleared by the [place] FDA and has been authorized for detection and/or diagnosis of SARS-CoV-2 by FDA under an Emergency Use Authorization (EUA). This EUA will remain in effect (meaning this test can be used) for the duration of the COVID-19 declaration under Section 564(b)(1) of the Act, 21 U.S.C. section 360bbb-3(b)(1), unless the authorization is terminated or revoked.  Performed at [facility], [place] [address] [place], [place] [address]   Blood Culture (routine x 2)     Status: None   Collection Time: 08/06/21 10:37 PM   Specimen: BLOOD LEFT HAND  Result Value Ref Range Status   Specimen Description BLOOD LEFT HAND  Final   Special Requests    Final    BOTTLES DRAWN AEROBIC AND ANAEROBIC Blood Culture results may not be optimal due to an inadequate volume of blood received in culture bottles   Culture   Final    NO GROWTH 5 DAYS Performed at [facility], [place] [address] [place], [place] [address]    Report Status 08/11/2021 FINAL  Final  Urine Culture     Status: Abnormal   Collection Time: 08/06/21 10:41 PM   Specimen: In/Out Cath Urine  Result Value Ref Range Status   Specimen Description IN/OUT CATH URINE  Final   Special Requests   Final    NONE Performed at [facility], [place] [address], [place], [name] [address]    Culture 70,000 COLONIES/mL KLEBSIELLA PNEUMONIAE (A)  Final   Report Status 08/09/2021 FINAL  Final   Organism ID, Bacteria KLEBSIELLA PNEUMONIAE (A)  Final      Susceptibility   Klebsiella pneumoniae - MIC*    AMPICILLIN >=32 RESISTANT Resistant     CEFAZOLIN <=4 SENSITIVE Sensitive     CEFEPIME <=0.12 SENSITIVE Sensitive     CEFTRIAXONE <=0.25 SENSITIVE Sensitive     CIPROFLOXACIN <=0.25 SENSITIVE Sensitive     GENTAMICIN <=1 SENSITIVE Sensitive     IMIPENEM <=0.25 SENSITIVE Sensitive     NITROFURANTOIN 32 SENSITIVE Sensitive     TRIMETH/SULFA <=20 SENSITIVE Sensitive     AMPICILLIN/SULBACTAM 4 SENSITIVE Sensitive  PIP/TAZO <=4 SENSITIVE Sensitive     * 70,000 COLONIES/mL KLEBSIELLA PNEUMONIAE  Blood Culture (routine x 2)     Status: None   Collection Time: 08/06/21 11:00 PM   Specimen: BLOOD RIGHT ARM  Result Value Ref Range Status   Specimen Description BLOOD RIGHT ARM  Final   Special Requests   Final    BOTTLES DRAWN AEROBIC AND ANAEROBIC Blood Culture adequate volume   Culture   Final    NO GROWTH 5 DAYS Performed at [facility], [address] [place], [place] [address]    Report Status 08/11/2021 FINAL  Final    RADIOLOGY STUDIES/RESULTS: DG Chest Port 1V same Day  Result Date: 08/11/2021 CLINICAL DATA:  Shortness of breath EXAM: PORTABLE CHEST 1 VIEW  COMPARISON:  08/06/2021 FINDINGS: The heart size and mediastinal contours are within normal limits. Both lungs are clear. The visualized skeletal structures are unremarkable. IMPRESSION: No acute abnormality of the lungs in AP portable projection. Electronically Signed   By: Delanna Ahmadi M.D.   On: 08/11/2021 09:52     LOS: 5 days   Oren Binet, MD  [facility]    To contact the attending provider between 7A-7P or the covering provider during after hours 7P-7A, please log into the web site [email or web address] and access using universal [place] password for that web site. If you do not have the password, please call the hospital operator.  08/12/2021, 10:30 AM

## 2021-08-13 ENCOUNTER — Inpatient Hospital Stay (HOSPITAL_COMMUNITY): Payer: PPO

## 2021-08-13 LAB — BASIC METABOLIC PANEL
Anion gap: 11 (ref 5–15)
BUN: 11 mg/dL (ref 8–23)
CO2: 24 mmol/L (ref 22–32)
Calcium: 8.8 mg/dL — ABNORMAL LOW (ref 8.9–10.3)
Chloride: 102 mmol/L (ref 98–111)
Creatinine, Ser: 0.64 mg/dL (ref 0.44–1.00)
GFR, Estimated: >60 mL/min (ref >60)
Glucose, Bld: 124 mg/dL — ABNORMAL HIGH (ref 70–99)
Potassium: 4.3 mmol/L (ref 3.5–5.1)
Sodium: 137 mmol/L (ref 135–145)

## 2021-08-13 LAB — CBC
HCT: 34.2 % — ABNORMAL LOW (ref 36.0–46.0)
Hemoglobin: 10.4 g/dL — ABNORMAL LOW (ref 12.0–15.0)
MCH: 29.7 pg (ref 26.0–34.0)
MCHC: 30.4 g/dL (ref 30.0–36.0)
MCV: 97.7 fL (ref 80.0–100.0)
Platelets: 193 10*3/uL (ref 150–400)
RBC: 3.5 MIL/uL — ABNORMAL LOW (ref 3.87–5.11)
RDW: 17.8 % — ABNORMAL HIGH (ref 11.5–15.5)
WBC: 12.4 10*3/uL — ABNORMAL HIGH (ref 4.0–10.5)
nRBC: 0.2 % (ref 0.0–0.2)

## 2021-08-13 LAB — BRAIN NATRIURETIC PEPTIDE: B Natriuretic Peptide: 17.6 pg/mL (ref 0.0–100.0)

## 2021-08-13 MED ORDER — IPRATROPIUM-ALBUTEROL 0.5-2.5 (3) MG/3ML IN SOLN: 3.0000 mL | Freq: Four times a day (QID) | RESPIRATORY_TRACT | Status: DC | PRN

## 2021-08-13 NOTE — Progress Notes (Signed)
Patient offered to bathe; patient and daughter requested to skip bath this shift so patient can continue resting.

## 2021-08-13 NOTE — Evaluation (Signed)
Clinical/Bedside Swallow Evaluation Patient Details  Name: Katelyn Lamb MRN: [id number] Date of Birth: 03-Mar-1946  Today's Date: 08/13/2021 Time: SLP Start Time (ACUTE ONLY): 1200 SLP Stop Time (ACUTE ONLY): 1210 SLP Time Calculation (min) (ACUTE ONLY): 10 min  Past Medical History:  Past Medical History:  Diagnosis Date   Acute deep vein thrombosis (DVT) of popliteal vein of left lower extremity (HCC)    Anxiety    Back pain    Chronic kidney disease    Encephalomyelitis    Gait abnormality 11/14/2016   Memory difficulty 03/02/2019   Myelitis due to herpes simplex ([facility])    Neurogenic bladder    Past Surgical History:  Past Surgical History:  Procedure Laterality Date   ABDOMINAL HYSTERECTOMY     BLADDER REPAIR     CESAREAN SECTION     IR ANGIO EXTERNAL CAROTID SEL EXT CAROTID BILAT MOD SED  07/18/2021   IR ANGIO INTRA EXTRACRAN SEL INTERNAL CAROTID BILAT MOD SED  07/18/2021   IR KYPHO LUMBAR INC FX REDUCE BONE BX UNI/BIL CANNULATION INC/IMAGING  04/11/2018   IR NEURO EACH ADD'L AFTER BASIC UNI LEFT (MS)  07/18/2021   IR NEURO EACH ADD'L AFTER BASIC UNI RIGHT (MS)  07/18/2021   IR TRANSCATH/EMBOLIZ  07/18/2021   IR US GUIDE VASC ACCESS RIGHT  07/18/2021   RADIOLOGY WITH ANESTHESIA N/A 07/18/2021   Procedure: IR WITH ANESTHESIA;  Surgeon: Pedro Earls, MD;  Location: [facility];  Service: Radiology;  Laterality: N/A;   TUBAL LIGATION     HPI:  Patient is a 75 y.o. female with history of prior VTE, seizure disorder, HSV encephalomyelitis in 2018 with baseline gait dysfunction requiring a walker-recent hospitalization from 11/11-11/30  UTI and epistaxis requiring intubation and embolization of the left sphenopalatine artery by IR (off Xarelto since then)-presented with shortness of breath-found to have PE and right lower extremity DVT.    Assessment / Plan / Recommendation  Clinical Impression  Pt demonstrates no signs of dysphagia. Typically takes small sips but able  to pass 3 oz water swallow. Discussed basic precautions. No SLP f/u needed will sign off. SLP Visit Diagnosis: Dysphagia, unspecified (R13.10)    Aspiration Risk       Diet Recommendation Regular;Thin liquid        Other  Recommendations      Recommendations for follow up therapy are one component of a multi-disciplinary discharge planning process, led by the attending physician.  Recommendations may be updated based on patient status, additional functional criteria and insurance authorization.  Follow up Recommendations        Assistance Recommended at Discharge    Functional Status Assessment    Frequency and Duration            Prognosis        Swallow Study   General HPI: Patient is a 75 y.o. female with history of prior VTE, seizure disorder, HSV encephalomyelitis in 2018 with baseline gait dysfunction requiring a walker-recent hospitalization from 11/11-11/30  UTI and epistaxis requiring intubation and embolization of the left sphenopalatine artery by IR (off Xarelto since then)-presented with shortness of breath-found to have PE and right lower extremity DVT. Type of Study: Bedside Swallow Evaluation Previous Swallow Assessment: none Diet Prior to this Study: Regular;Thin liquids Temperature Spikes Noted: No Respiratory Status: Nasal cannula History of Recent Intubation: No Behavior/Cognition: Alert;Cooperative;Pleasant mood Oral Cavity Assessment: Within Functional Limits Oral Care Completed by SLP: No Oral Cavity - Dentition: Adequate natural dentition Vision: Functional  for self-feeding Self-Feeding Abilities: Needs assist Patient Positioning: Upright in bed Baseline Vocal Quality: Normal Volitional Cough: Strong;Congested Volitional Swallow: Able to elicit    Oral/Motor/Sensory Function Overall Oral Motor/Sensory Function: Within functional limits   Ice Chips     Thin Liquid Thin Liquid: Within functional limits    Nectar Thick Nectar Thick Liquid: Not  tested   Honey Thick Honey Thick Liquid: Not tested   Puree Puree: Within functional limits   Solid     Solid: Within functional limits      Katelyn Lamb, Katelyn Lamb 08/13/2021,1:02 PM

## 2021-08-13 NOTE — Progress Notes (Signed)
PROGRESS NOTE        PATIENT DETAILS Name: Katelyn Lamb Age: 75 y.o. Sex: female Date of Birth: 12/07/1945 Admit Date: 08/06/2021 Admitting Physician Rhetta Mura, DO ELF:[id number], [place], MD  Brief Narrative: Patient is a 75 y.o. female with history of prior VTE, seizure disorder, HSV encephalomyelitis in 2018 with baseline gait dysfunction requiring a walker-recent hospitalization from 11/11-11/30  UTI and epistaxis requiring intubation and embolization of the left sphenopalatine artery by IR (off Xarelto since then)-presented with shortness of breath-found to have PE and right lower extremity DVT.  Started on IV heparin and subsequently transition to oral Eliquis-however for the hospital course complicated by development of HCAP.  See below for further details.  Subjective: Appears significantly better today-less congested.  Appears very much more comfortable today compared to yesterday.  Objective: Vitals: Blood pressure 110/90, pulse 96, temperature 97.6 F (36.4 C), temperature source Oral, resp. rate 18, height 5\' 7"  (1.702 m), weight 76.1 kg, SpO2 94 %.   Exam: Gen Exam:Alert awake-not in any distress HEENT:atraumatic, normocephalic Chest: Still with bibasilar rales-but significantly less transmitted upper airway sounds. CVS:S1S2 regular Abdomen:soft non tender, non distended Extremities:no edema Neurology: Non focal Skin: no rash   Pertinent Labs/Radiology: Recent Labs  Lab 08/06/21 2225 08/06/21 2330 08/07/21 0500 08/07/21 0740 08/09/21 0156 08/10/21 0030 08/13/21 0036  WBC 24.4*  --   --    < > 12.1*   < > 12.4*  HGB 12.9   < >  --    < > 10.6*   < > 10.4*  PLT 292  --   --    < > 190   < > 193  NA 136   < > 132*   < > 138   < > 137  K 4.2   < > 3.8   < > 4.1   < > 4.3  CREATININE 0.74   < > 0.67   < > 0.60   < > 0.64  AST 28  --  25  --  23  --   --   ALT 40  --  33  --  25  --   --   ALKPHOS 109  --  90  --  85  --   --    BILITOT 0.8  --  0.9  --  0.5  --   --    < > = values in this interval not displayed.      Assessment/Plan: Acute hypoxic respiratory failure due to PE and HCAP: Minimal oxygen requirement.  Continue anticoagulation and antibiotics.  Bilateral lobar, segmental and subsegmental PE with right lower extremity DVT: Initially on IV heparin-subsequently transition to Eliquis on 12/7.  Seems to be tolerating anticoagulation well without any major bleeding issues.    Note on 12/7-this MD had extensive discussion regarding risks/benefits of long-term anticoagulation with patient/spouse (at bedside)/son (over the phone)-all understand that benefits outweigh risks-all accept risks including catastrophic bleeding.  Subsequently discussed different options of long-term anticoagulation-Eliquis/Xarelto versus Coumadin.  Patient/family elected to try Eliquis.  HCAP: Developed a significant amount of congestion over the past few days-worsening leukocytosis on 12/10-worsening bibasilar infiltrates on chest x-ray on 12/10-subsequently started on IV vancomycin/cefepime-leukocytosis now downtrending-blood cultures on 12/11 negative so far.  If clinical improvement continues-suspect can discontinue vancomycin and just continue with cefepime.  Continue to encourage  incentive spirometry/flutter valve/out of bed to chair.  Portal venous gas in the liver: Seen incidentally on CT imaging-no pneumatosis seen on CT imaging-abdominal exam remains benign-unclear significance of this finding.  Briefly discussed with surgery team-recommendations are to continue with supportive care-given that abdominal exam remains benign and leukocytosis has resolved spontaneously.  Asymptomatic bacteriuria: Likely colonization-continue to monitor off antimicrobial therapy.  Seizure disorder: Continue Keppra  GERD: Continue PPI  History of HSV encephalomyelitis with chronic gait dysfunction: Weakness has worsened due to acute  hospitalization-plans of SNF on discharge.  Neurogenic bladder with chronic indwelling suprapubic catheter: Due to sequelae of HSV encephalomyelitis  Chronic steroid use: On usual dosing of prednisone.  Per patient/family this was started by her neurologist several months ago for her underlying history of encephalomyelitis.  BMI Estimated body mass index is 26.28 kg/m as calculated from the following:   Height as of this encounter: 5\' 7"  (1.702 m).   Weight as of this encounter: 76.1 kg.   Procedures: None Consults: PCCM DVT Prophylaxis:IV heparin>>eliquis Code Status: DNR Family Communication: Spouse at bedside on 12/10.  Time spent: 35 minutes-Greater than 50% of this time was spent in counseling, explanation of diagnosis, planning of further management, and coordination of care.   Disposition Plan: Status is: Inpatient  Remains inpatient appropriate because: Resolving hypoxia due to large PE.  Awaiting SNF-have placed call to insurance MD for peer to peer-I have left a voicemail    Diet: Diet Order             Diet regular Room service appropriate? Yes with Assist; Fluid consistency: Thin  Diet effective now                     Antimicrobial agents: Anti-infectives (From admission, onward)    Start     Dose/Rate Route Frequency Ordered Stop   08/13/21 1700  vancomycin (VANCOREADY) IVPB 1500 mg/300 mL        1,500 mg 150 mL/hr over 120 Minutes Intravenous Every 24 hours 08/12/21 1533     08/12/21 1615  ceFEPIme (MAXIPIME) 2 g in sodium chloride 0.9 % 100 mL IVPB        2 g 200 mL/hr over 30 Minutes Intravenous Every 8 hours 08/12/21 1525     08/12/21 1615  vancomycin (VANCOREADY) IVPB 1500 mg/300 mL        1,500 mg 150 mL/hr over 120 Minutes Intravenous  Once 08/12/21 1525 08/12/21 1946   08/07/21 2330  acyclovir (ZOVIRAX) tablet 400 mg        400 mg Oral 2 times daily 08/07/21 2233     08/07/21 2200  vancomycin (VANCOREADY) IVPB 1500 mg/300 mL  Status:   Discontinued        1,500 mg 150 mL/hr over 120 Minutes Intravenous Every 24 hours 08/07/21 0128 08/07/21 0546   08/07/21 0600  aztreonam (AZACTAM) 1 g in sodium chloride 0.9 % 100 mL IVPB  Status:  Discontinued        1 g 200 mL/hr over 30 Minutes Intravenous Every 8 hours 08/07/21 0128 08/07/21 0546   08/06/21 2300  vancomycin (VANCOREADY) IVPB 1500 mg/300 mL        1,500 mg 150 mL/hr over 120 Minutes Intravenous  Once 08/06/21 2227 08/07/21 0143   08/06/21 2230  aztreonam (AZACTAM) 2 g in sodium chloride 0.9 % 100 mL IVPB        2 g 200 mL/hr over 30 Minutes Intravenous  Once 08/06/21 2227 08/06/21 2329  08/06/21 2230  metroNIDAZOLE (FLAGYL) IVPB 500 mg        500 mg 100 mL/hr over 60 Minutes Intravenous  Once 08/06/21 2227 08/07/21 0029        MEDICATIONS: Scheduled Meds:  acyclovir  400 mg Oral BID   apixaban  5 mg Oral BID   benzonatate  200 mg Oral TID   DULoxetine  30 mg Oral q AM   DULoxetine  60 mg Oral QHS   feeding supplement  237 mL Oral BID BM   guaiFENesin  1,200 mg Oral BID   levETIRAcetam  250 mg Oral BID   mirabegron ER  50 mg Oral QHS   multivitamin with minerals  1 tablet Oral Daily   pantoprazole  40 mg Oral QHS   predniSONE  5 mg Oral Q breakfast   QUEtiapine  100 mg Oral QHS   Continuous Infusions:  ceFEPime (MAXIPIME) IV 2 g (08/13/21 0947)   vancomycin     PRN Meds:.acetaminophen **OR** acetaminophen, albuterol, diazepam, fentaNYL (SUBLIMAZE) injection, ipratropium-albuterol, lip balm, mupirocin ointment, naLOXone (NARCAN)  injection, sodium chloride   I have personally reviewed following labs and imaging studies  LABORATORY DATA: CBC: Recent Labs  Lab 08/06/21 2225 08/06/21 2330 08/10/21 0030 08/11/21 0120 08/12/21 1111 08/12/21 1550 08/13/21 0036  WBC 24.4*   < > 10.0 11.9* 14.1* 14.7* 12.4*  NEUTROABS 18.2*  --   --   --   --   --   --   HGB 12.9   < > 9.5* 10.3* 10.8* 11.0* 10.4*  HCT 42.2   < > 30.8* 33.7* 34.7* 35.3* 34.2*   MCV 98.8   < > 98.1 99.7 96.9 96.4 97.7  PLT 292   < > 194 185 203 211 193   < > = values in this interval not displayed.     Basic Metabolic Panel: Recent Labs  Lab 08/07/21 0500 08/08/21 0038 08/09/21 0156 08/12/21 1111 08/12/21 1550 08/13/21 0036  NA 132* 135 138 134* 136 137  K 3.8 3.8 4.1 4.2 4.2 4.3  CL 100 101 101 97* 99 102  CO2 23 23 28 27 26 24   GLUCOSE 112* 82 143* 133* 147* 124*  BUN 21 12 11 14 14 11   CREATININE 0.67 0.66 0.60 0.76 0.80 0.64  CALCIUM 8.8* 8.2* 8.3* 9.1 9.2 8.8*  MG 1.6* 1.5* 2.2  --   --   --   PHOS 3.9  --   --   --   --   --      GFR: Estimated Creatinine Clearance: 66.6 mL/min (by C-G formula based on SCr of 0.64 mg/dL).  Liver Function Tests: Recent Labs  Lab 08/06/21 2225 08/07/21 0500 08/09/21 0156  AST 28 25 23   ALT 40 33 25  ALKPHOS 109 90 85  BILITOT 0.8 0.9 0.5  PROT 6.9 5.9* 5.7*  ALBUMIN 3.5 2.9* 2.8*    No results for input(s): LIPASE, AMYLASE in the last 168 hours. No results for input(s): AMMONIA in the last 168 hours.  Coagulation Profile: Recent Labs  Lab 08/06/21 2225  INR 1.0     Cardiac Enzymes: No results for input(s): CKTOTAL, CKMB, CKMBINDEX, TROPONINI in the last 168 hours.  BNP (last 3 results) No results for input(s): PROBNP in the last 8760 hours.  Lipid Profile: No results for input(s): CHOL, HDL, LDLCALC, TRIG, CHOLHDL, LDLDIRECT in the last 72 hours.  Thyroid Function Tests: No results for input(s): TSH, T4TOTAL, FREET4, T3FREE, THYROIDAB in the last  72 hours.  Anemia Panel: No results for input(s): VITAMINB12, FOLATE, FERRITIN, TIBC, IRON, RETICCTPCT in the last 72 hours.  Urine analysis:    Component Value Date/Time   COLORURINE YELLOW 08/06/2021 2300   APPEARANCEUR CLOUDY (A) 08/06/2021 2300   LABSPEC 1.021 08/06/2021 2300   PHURINE 5.0 08/06/2021 2300   GLUCOSEU NEGATIVE 08/06/2021 2300   HGBUR NEGATIVE 08/06/2021 2300   BILIRUBINUR NEGATIVE 08/06/2021 2300   KETONESUR  NEGATIVE 08/06/2021 2300   PROTEINUR 30 (A) 08/06/2021 2300   NITRITE NEGATIVE 08/06/2021 2300   LEUKOCYTESUR MODERATE (A) 08/06/2021 2300    Sepsis Labs: Lactic Acid, Venous    Component Value Date/Time   LATICACIDVEN 1.7 08/07/2021 0556    MICROBIOLOGY: Recent Results (from the past 240 hour(s))  Resp Panel by RT-PCR (Flu A&B, Covid) Nasopharyngeal Swab     Status: None   Collection Time: 08/06/21 10:25 PM   Specimen: Nasopharyngeal Swab; Nasopharyngeal(NP) swabs in vial transport medium  Result Value Ref Range Status   SARS Coronavirus 2 by RT PCR NEGATIVE NEGATIVE Final    Comment: (NOTE) SARS-CoV-2 target nucleic acids are NOT DETECTED.  The SARS-CoV-2 RNA is generally detectable in upper respiratory specimens during the acute phase of infection. The lowest concentration of SARS-CoV-2 viral copies this assay can detect is 138 copies/mL. A negative result does not preclude SARS-Cov-2 infection and should not be used as the sole basis for treatment or other patient management decisions. A negative result may occur with  improper specimen collection/handling, submission of specimen other than nasopharyngeal swab, presence of viral mutation(s) within the areas targeted by this assay, and inadequate number of viral copies(<138 copies/mL). A negative result must be combined with clinical observations, patient history, and epidemiological information. The expected result is Negative.  Fact Sheet for Patients:  EntrepreneurPulse.com.au  Fact Sheet for Healthcare Providers:  IncredibleEmployment.be  This test is no t yet approved or cleared by the [place] FDA and  has been authorized for detection and/or diagnosis of SARS-CoV-2 by FDA under an Emergency Use Authorization (EUA). This EUA will remain  in effect (meaning this test can be used) for the duration of the COVID-19 declaration under Section 564(b)(1) of the Act,  21 U.S.C.section 360bbb-3(b)(1), unless the authorization is terminated  or revoked sooner.       Influenza A by PCR NEGATIVE NEGATIVE Final   Influenza B by PCR NEGATIVE NEGATIVE Final    Comment: (NOTE) The Xpert Xpress SARS-CoV-2/FLU/RSV plus assay is intended as an aid in the diagnosis of influenza from Nasopharyngeal swab specimens and should not be used as a sole basis for treatment. Nasal washings and aspirates are unacceptable for Xpert Xpress SARS-CoV-2/FLU/RSV testing.  Fact Sheet for Patients: EntrepreneurPulse.com.au  Fact Sheet for Healthcare Providers: IncredibleEmployment.be  This test is not yet approved or cleared by the [place] FDA and has been authorized for detection and/or diagnosis of SARS-CoV-2 by FDA under an Emergency Use Authorization (EUA). This EUA will remain in effect (meaning this test can be used) for the duration of the COVID-19 declaration under Section 564(b)(1) of the Act, 21 U.S.C. section 360bbb-3(b)(1), unless the authorization is terminated or revoked.  Performed at [facility], [place] [address] [place],  [address]   Blood Culture (routine x 2)     Status: None   Collection Time: 08/06/21 10:37 PM   Specimen: BLOOD LEFT HAND  Result Value Ref Range Status   Specimen Description BLOOD LEFT HAND  Final   Special Requests  Final    BOTTLES DRAWN AEROBIC AND ANAEROBIC Blood Culture results may not be optimal due to an inadequate volume of blood received in culture bottles   Culture   Final    NO GROWTH 5 DAYS Performed at [facility], [place] [address], [place], [place] [address]    Report Status 08/11/2021 FINAL  Final  Urine Culture     Status: Abnormal   Collection Time: 08/06/21 10:41 PM   Specimen: In/Out Cath Urine  Result Value Ref Range Status   Specimen Description IN/OUT CATH URINE  Final   Special Requests   Final    NONE Performed at [facility],  [place] [address] [place], [place] [address]    Culture 70,000 COLONIES/mL KLEBSIELLA PNEUMONIAE (A)  Final   Report Status 08/09/2021 FINAL  Final   Organism ID, Bacteria KLEBSIELLA PNEUMONIAE (A)  Final      Susceptibility   Klebsiella pneumoniae - MIC*    AMPICILLIN >=32 RESISTANT Resistant     CEFAZOLIN <=4 SENSITIVE Sensitive     CEFEPIME <=0.12 SENSITIVE Sensitive     CEFTRIAXONE <=0.25 SENSITIVE Sensitive     CIPROFLOXACIN <=0.25 SENSITIVE Sensitive     GENTAMICIN <=1 SENSITIVE Sensitive     IMIPENEM <=0.25 SENSITIVE Sensitive     NITROFURANTOIN 32 SENSITIVE Sensitive     TRIMETH/SULFA <=20 SENSITIVE Sensitive     AMPICILLIN/SULBACTAM 4 SENSITIVE Sensitive     PIP/TAZO <=4 SENSITIVE Sensitive     * 70,000 COLONIES/mL KLEBSIELLA PNEUMONIAE  Blood Culture (routine x 2)     Status: None   Collection Time: 08/06/21 11:00 PM   Specimen: BLOOD RIGHT ARM  Result Value Ref Range Status   Specimen Description BLOOD RIGHT ARM  Final   Special Requests   Final    BOTTLES DRAWN AEROBIC AND ANAEROBIC Blood Culture adequate volume   Culture   Final    NO GROWTH 5 DAYS Performed at [facility], [address], [place], [place] [address]    Report Status 08/11/2021 FINAL  Final  Culture, blood (routine x 2)     Status: None (Preliminary result)   Collection Time: 08/12/21  3:50 PM   Specimen: BLOOD LEFT HAND  Result Value Ref Range Status   Specimen Description BLOOD LEFT HAND  Final   Special Requests   Final    BOTTLES DRAWN AEROBIC AND ANAEROBIC Blood Culture adequate volume   Culture   Final    NO GROWTH < 24 HOURS Performed at [facility], [place] [address], [place], [place] [address]    Report Status PENDING  Incomplete  Culture, blood (routine x 2)     Status: None (Preliminary result)   Collection Time: 08/12/21  3:50 PM   Specimen: BLOOD LEFT WRIST  Result Value Ref Range Status   Specimen Description BLOOD LEFT WRIST  Final   Special Requests   Final    BOTTLES  DRAWN AEROBIC AND ANAEROBIC Blood Culture adequate volume   Culture   Final    NO GROWTH < 24 HOURS Performed at [facility], [place] [address] [place], [place] [address]    Report Status PENDING  Incomplete    RADIOLOGY STUDIES/RESULTS: DG Chest Port 1 View  Result Date: 08/12/2021 CLINICAL DATA:  Short of breath. EXAM: PORTABLE CHEST 1 VIEW COMPARISON:  08/11/2021 FINDINGS: Heart size and vascularity normal. Mild bibasilar atelectasis with slight progression. No effusion. Upper lobes clear. IMPRESSION: Mild bibasilar atelectasis with  progression. Electronically Signed   By: Franchot Gallo M.D.   On: 08/12/2021 10:50   DG Chest Port 1V same Day  Result Date: 08/13/2021 CLINICAL DATA:  Shortness of breath. EXAM: PORTABLE CHEST 1 VIEW COMPARISON:  August 12, 2021. FINDINGS: The heart size and mediastinal contours are within normal limits. Mild bibasilar atelectasis or infiltrates are again noted and stable. The visualized skeletal structures are unremarkable. IMPRESSION: Stable bibasilar atelectasis or infiltrates. Electronically Signed   By: Marijo Conception M.D.   On: 08/13/2021 05:27     LOS: 6 days   Oren Binet, MD  [facility]    To contact the attending provider between 7A-7P or the covering provider during after hours 7P-7A, please log into the web site [email or web address] and access using universal [place] password for that web site. If you do not have the password, please call the hospital operator.  08/13/2021, 12:36 PM

## 2021-08-14 DIAGNOSIS — J189 Pneumonia, unspecified organism: Secondary | ICD-10-CM

## 2021-08-14 LAB — CBC
HCT: 34.7 % — ABNORMAL LOW (ref 36.0–46.0)
Hemoglobin: 10.6 g/dL — ABNORMAL LOW (ref 12.0–15.0)
MCH: 29.9 pg (ref 26.0–34.0)
MCHC: 30.5 g/dL (ref 30.0–36.0)
MCV: 98 fL (ref 80.0–100.0)
Platelets: 227 10*3/uL (ref 150–400)
RBC: 3.54 MIL/uL — ABNORMAL LOW (ref 3.87–5.11)
RDW: 17.3 % — ABNORMAL HIGH (ref 11.5–15.5)
WBC: 13 10*3/uL — ABNORMAL HIGH (ref 4.0–10.5)
nRBC: 0 % (ref 0.0–0.2)

## 2021-08-14 LAB — RESP PANEL BY RT-PCR (FLU A&B, COVID) ARPGX2
Influenza A by PCR: NEGATIVE
Influenza B by PCR: NEGATIVE
SARS Coronavirus 2 by RT PCR: NEGATIVE

## 2021-08-14 LAB — BASIC METABOLIC PANEL
Anion gap: 10 (ref 5–15)
BUN: 15 mg/dL (ref 8–23)
CO2: 24 mmol/L (ref 22–32)
Calcium: 8.8 mg/dL — ABNORMAL LOW (ref 8.9–10.3)
Chloride: 102 mmol/L (ref 98–111)
Creatinine, Ser: 0.56 mg/dL (ref 0.44–1.00)
GFR, Estimated: >60 mL/min (ref >60)
Glucose, Bld: 116 mg/dL — ABNORMAL HIGH (ref 70–99)
Potassium: 4.5 mmol/L (ref 3.5–5.1)
Sodium: 136 mmol/L (ref 135–145)

## 2021-08-14 MED ORDER — IPRATROPIUM-ALBUTEROL 0.5-2.5 (3) MG/3ML IN SOLN
3.0000 mL | Freq: Three times a day (TID) | RESPIRATORY_TRACT | Status: DC
  Administered 2021-08-14 – 2021-08-15 (×4): 3 mL via RESPIRATORY_TRACT
  Filled 2021-08-14 (×4): qty 3

## 2021-08-14 NOTE — Progress Notes (Signed)
Physical Therapy Treatment Patient Details Name: Katelyn Lamb MRN: [id number] DOB: [birth year] Today's Date: 08/14/2021   History of Present Illness 75 y.o. female who is admitted to [facility] on 08/06/2021 with acute bilateral pulmonary emboli with suggestion of cor pulmonale after presenting to [facility] ED complaining of shortness of breath. Pt recently on Eliquis for LE DVT but was on hold due to epistaxis requiring cauterization to stop bleeding. 08/07/21 +RLE DVT  PMH- significant for prior DVT, seizure disorder, GERD    PT Comments    Pt was seen for mobility on stedy to stand at side of bed, resulting in a drop in her sats to 79%.  Attempted to do pursed lip breathing to mitigate the effects of the activity with no success.  As soon as pt sat down, quickly recovered back to high 90's on O2.  Pt is motivated to work and demonstrates interest in getting home but is not able to tolerate much activity yet.  Follow along with her for acute PT goals, focusing on sats with any movement and consistency with standing practice as well as OOB to chair.  Recommendations for follow up therapy are one component of a multi-disciplinary discharge planning process, led by the attending physician.  Recommendations may be updated based on patient status, additional functional criteria and insurance authorization.  Follow Up Recommendations  Skilled nursing-short term rehab (<3 hours/day)     Assistance Recommended at Discharge Frequent or [facility] bed;Other (comment) (hoyer lift)    Recommendations for Other Services       Precautions / Restrictions Precautions Precautions: Fall;Other (comment) Precaution Comments: suprapubic catheter Restrictions Weight Bearing Restrictions: No     Mobility  Bed Mobility Overal bed mobility: Needs Assistance Bed Mobility: Rolling;Sidelying to Sit;Sit to Sidelying Rolling: Mod assist Sidelying to  sit: Mod assist     Sit to sidelying: Max assist General bed mobility comments: slow to respond to cues but can be assisted to sit with close guarding for loss of balance backward    Transfers Overall transfer level: Needs assistance Equipment used: Ambulation equipment used Transfers: Sit to/from Stand Sit to Stand: Mod assist Stand pivot transfers: Total assist         General transfer comment: assisted with just PT with quick transition to get sitting pads under pt Transfer via Lift Equipment: Stedy  Ambulation/Gait               General Gait Details: unable   Stairs             Wheelchair Mobility    Modified Rankin (Stroke Patients Only)       Balance Overall balance assessment: Needs assistance Sitting-balance support: Feet supported;Single extremity supported Sitting balance-Leahy Scale: Poor     Standing balance support: Bilateral upper extremity supported;During functional activity Standing balance-Leahy Scale: Poor Standing balance comment: nearly zero balance                            Cognition Arousal/Alertness: Awake/alert Behavior During Therapy: WFL for tasks assessed/performed Overall Cognitive Status: Impaired/Different from baseline Area of Impairment: Problem solving;Safety/judgement;Awareness;Following commands;Attention                   Current Attention Level: Selective Memory: Decreased recall of precautions;Decreased short-term memory Following Commands: Follows one step commands inconsistently;Follows one step commands with increased time Safety/Judgement: Decreased awareness of safety Awareness: Intellectual Problem Solving: Slow processing;Requires  verbal cues;Requires tactile cues General Comments: pt was distracted by breathing but once sitting announced she needed a bedpan and waited        Exercises      General Comments General comments (skin integrity, edema, etc.): pt was assisted to  stand in stedy then was desaturating with standing only to 79%, had to return to sit and recovered to 90's immediately      Pertinent Vitals/Pain Pain Assessment: Faces Faces Pain Scale: Hurts a little bit Breathing: normal Negative Vocalization: none Facial Expression: smiling or inexpressive Body Language: relaxed Consolability: no need to console PAINAD Score: 0 Pain Location: "all over"    Home Living                          Prior Function            PT Goals (current goals can now be found in the care plan section) Acute Rehab PT Goals Patient Stated Goal: get stronger and go home    Frequency    Min 2X/week      PT Plan Current plan remains appropriate    Co-evaluation              AM-PAC PT "6 Clicks" Mobility   Outcome Measure  Help needed turning from your back to your side while in a flat bed without using bedrails?: A Lot Help needed moving from lying on your back to sitting on the side of a flat bed without using bedrails?: A Lot Help needed moving to and from a bed to a chair (including a wheelchair)?: Total Help needed standing up from a chair using your arms (e.g., wheelchair or bedside chair)?: Total Help needed to walk in hospital room?: Total Help needed climbing 3-5 steps with a railing? : Total 6 Click Score: 8    End of Session Equipment Utilized During Treatment: Gait belt;Oxygen Activity Tolerance: Patient tolerated treatment well;Treatment limited secondary to medical complications (Comment) Patient left: in bed;with call bell/phone within reach;with bed alarm set;with family/visitor present Nurse Communication: Mobility status;Need for lift equipment PT Visit Diagnosis: Muscle weakness (generalized) (M62.81);Difficulty in walking, not elsewhere classified (R26.2)     Time: 7124-5809 PT Time Calculation (min) (ACUTE ONLY): 28 min  Charges:  $Therapeutic Activity: 23-37 mins          Ramond Dial 08/14/2021, 4:01  PM Mee Hives, PT PhD Acute Rehab Dept. Number: [place] [facility] [place]

## 2021-08-14 NOTE — Progress Notes (Signed)
PROGRESS NOTE        PATIENT DETAILS Name: Katelyn Lamb Age: 75 y.o. Sex: female Date of Birth: 20-Apr-1946 Admit Date: 08/06/2021 Admitting Physician Rhetta Mura, DO FIE:PPIRJJO, [place], MD  Brief Narrative: Patient is a 75 y.o. female with history of prior VTE, seizure disorder, HSV encephalomyelitis in 2018 with baseline gait dysfunction requiring a walker-recent hospitalization from 11/11-11/30  UTI and epistaxis requiring intubation and embolization of the left sphenopalatine artery by IR (off Xarelto since then)-presented with shortness of breath-found to have PE and right lower extremity DVT.  Started on IV heparin and subsequently transition to oral Eliquis-however for the hospital course complicated by development of HCAP.  See below for further details.  Subjective: Some cough continues but overall much better-significantly less congested in the past few days.  Objective: Vitals: Blood pressure 119/81, pulse (!) 111, temperature 97.6 F (36.4 C), temperature source Axillary, resp. rate 20, height 5\' 7"  (1.702 m), weight 76.1 kg, SpO2 96 %.   Exam: Gen Exam:Alert awake-not in any distress HEENT:atraumatic, normocephalic Chest: Few rales-moving air well-significantly better sounding than just 2 days back. CVS:S1S2 regular Abdomen:soft non tender, non distended Extremities:no edema Neurology: Non focal-but with significant generalized weakness. Skin: no rash   Pertinent Labs/Radiology: Recent Labs  Lab 08/09/21 0156 08/10/21 0030 08/14/21 0034  WBC 12.1*   < > 13.0*  HGB 10.6*   < > 10.6*  PLT 190   < > 227  NA 138   < > 136  K 4.1   < > 4.5  CREATININE 0.60   < > 0.56  AST 23  --   --   ALT 25  --   --   ALKPHOS 85  --   --   BILITOT 0.5  --   --    < > = values in this interval not displayed.      Assessment/Plan: Acute hypoxic respiratory failure due to PE and HCAP: Improving-minimal oxygen requirement-on 1 L-mostly for  comfort-on anticoagulation with Eliquis and empiric antibiotics.   Bilateral lobar, segmental and subsegmental PE with right lower extremity DVT: Initially on IV heparin-subsequently transition to Eliquis on 12/7.  Seems to be tolerating anticoagulation well without any major bleeding issues.    Note on 12/7-this MD had extensive discussion regarding risks/benefits of long-term anticoagulation with patient/spouse (at bedside)/son (over the phone)-all understand that benefits outweigh risks-all accept risks including catastrophic bleeding.  Subsequently discussed different options of long-term anticoagulation-Eliquis/Xarelto versus Coumadin.  Patient/family elected to try Eliquis.  HCAP: Developed cough/congestion/worsening leukocytosis-CXR on 12/10 with worsening bibasilar infiltrates-subsequently started on vancomycin and cefepime.  Use of incentive spirometry/flutter valve was encouraged-she sounds significantly better today-continues to have some mild leukocytosis but clinically improved.  Repeat blood cultures negative so far.  Continue cefepime today-suspect he can be transition to oral antimicrobial therapy tomorrow morning.    Portal venous gas in the liver: Seen incidentally on CT imaging-no pneumatosis seen on CT imaging-abdominal exam remains benign-unclear significance of this finding.  Briefly discussed with surgery team-recommendations are to continue with supportive care-given that abdominal exam remains benign and leukocytosis has resolved spontaneously.  Asymptomatic bacteriuria: Likely colonization-continue to monitor off antimicrobial therapy.  Seizure disorder: Continue Keppra  GERD: Continue PPI  History of HSV encephalomyelitis with chronic gait dysfunction: Weakness has worsened due to acute hospitalization-plans of SNF on discharge.  Neurogenic bladder with chronic indwelling suprapubic catheter: Due to sequelae of HSV encephalomyelitis  Chronic steroid use: On usual dosing  of prednisone.  Per patient/family this was started by her neurologist several months ago for her underlying history of encephalomyelitis.  BMI Estimated body mass index is 26.28 kg/m as calculated from the following:   Height as of this encounter: 5\' 7"  (1.702 m).   Weight as of this encounter: 76.1 kg.   Procedures: None Consults: PCCM DVT Prophylaxis:IV heparin>>eliquis Code Status: DNR Family Communication: Spouse at bedside on 12/12.  Time spent: 25 minutes-Greater than 50% of this time was spent in counseling, explanation of diagnosis, planning of further management, and coordination of care.   Disposition Plan: Status is: Inpatient  Remains inpatient appropriate because: Resolving hypoxia due to large PE.  Awaiting SNF-have placed call to insurance MD for peer to peer-I have left a voicemail    Diet: Diet Order             Diet regular Room service appropriate? Yes with Assist; Fluid consistency: Thin  Diet effective now                     Antimicrobial agents: Anti-infectives (From admission, onward)    Start     Dose/Rate Route Frequency Ordered Stop   08/13/21 1700  vancomycin (VANCOREADY) IVPB 1500 mg/300 mL  Status:  Discontinued        1,500 mg 150 mL/hr over 120 Minutes Intravenous Every 24 hours 08/12/21 1533 08/13/21 1240   08/12/21 1615  ceFEPIme (MAXIPIME) 2 g in sodium chloride 0.9 % 100 mL IVPB        2 g 200 mL/hr over 30 Minutes Intravenous Every 8 hours 08/12/21 1525     08/12/21 1615  vancomycin (VANCOREADY) IVPB 1500 mg/300 mL        1,500 mg 150 mL/hr over 120 Minutes Intravenous  Once 08/12/21 1525 08/12/21 1946   08/07/21 2330  acyclovir (ZOVIRAX) tablet 400 mg        400 mg Oral 2 times daily 08/07/21 2233     08/07/21 2200  vancomycin (VANCOREADY) IVPB 1500 mg/300 mL  Status:  Discontinued        1,500 mg 150 mL/hr over 120 Minutes Intravenous Every 24 hours 08/07/21 0128 08/07/21 0546   08/07/21 0600  aztreonam (AZACTAM) 1 g in  sodium chloride 0.9 % 100 mL IVPB  Status:  Discontinued        1 g 200 mL/hr over 30 Minutes Intravenous Every 8 hours 08/07/21 0128 08/07/21 0546   08/06/21 2300  vancomycin (VANCOREADY) IVPB 1500 mg/300 mL        1,500 mg 150 mL/hr over 120 Minutes Intravenous  Once 08/06/21 2227 08/07/21 0143   08/06/21 2230  aztreonam (AZACTAM) 2 g in sodium chloride 0.9 % 100 mL IVPB        2 g 200 mL/hr over 30 Minutes Intravenous  Once 08/06/21 2227 08/06/21 2329   08/06/21 2230  metroNIDAZOLE (FLAGYL) IVPB 500 mg        500 mg 100 mL/hr over 60 Minutes Intravenous  Once 08/06/21 2227 08/07/21 0029        MEDICATIONS: Scheduled Meds:  acyclovir  400 mg Oral BID   apixaban  5 mg Oral BID   benzonatate  200 mg Oral TID   DULoxetine  30 mg Oral q AM   DULoxetine  60 mg Oral QHS   feeding supplement  237 mL Oral  BID BM   guaiFENesin  1,200 mg Oral BID   levETIRAcetam  250 mg Oral BID   mirabegron ER  50 mg Oral QHS   multivitamin with minerals  1 tablet Oral Daily   pantoprazole  40 mg Oral QHS   predniSONE  5 mg Oral Q breakfast   QUEtiapine  100 mg Oral QHS   Continuous Infusions:  ceFEPime (MAXIPIME) IV 2 g (08/14/21 0949)   PRN Meds:.acetaminophen **OR** acetaminophen, albuterol, diazepam, fentaNYL (SUBLIMAZE) injection, ipratropium-albuterol, lip balm, mupirocin ointment, naLOXone (NARCAN)  injection, sodium chloride   I have personally reviewed following labs and imaging studies  LABORATORY DATA: CBC: Recent Labs  Lab 08/11/21 0120 08/12/21 1111 08/12/21 1550 08/13/21 0036 08/14/21 0034  WBC 11.9* 14.1* 14.7* 12.4* 13.0*  HGB 10.3* 10.8* 11.0* 10.4* 10.6*  HCT 33.7* 34.7* 35.3* 34.2* 34.7*  MCV 99.7 96.9 96.4 97.7 98.0  PLT 185 203 211 193 227     Basic Metabolic Panel: Recent Labs  Lab 08/08/21 0038 08/09/21 0156 08/12/21 1111 08/12/21 1550 08/13/21 0036 08/14/21 0034  NA 135 138 134* 136 137 136  K 3.8 4.1 4.2 4.2 4.3 4.5  CL 101 101 97* 99 102 102   CO2 23 28 27 26 24 24   GLUCOSE 82 143* 133* 147* 124* 116*  BUN 12 11 14 14 11 15   CREATININE 0.66 0.60 0.76 0.80 0.64 0.56  CALCIUM 8.2* 8.3* 9.1 9.2 8.8* 8.8*  MG 1.5* 2.2  --   --   --   --      GFR: Estimated Creatinine Clearance: 66.6 mL/min (by C-G formula based on SCr of 0.56 mg/dL).  Liver Function Tests: Recent Labs  Lab 08/09/21 0156  AST 23  ALT 25  ALKPHOS 85  BILITOT 0.5  PROT 5.7*  ALBUMIN 2.8*    No results for input(s): LIPASE, AMYLASE in the last 168 hours. No results for input(s): AMMONIA in the last 168 hours.  Coagulation Profile: No results for input(s): INR, PROTIME in the last 168 hours.   Cardiac Enzymes: No results for input(s): CKTOTAL, CKMB, CKMBINDEX, TROPONINI in the last 168 hours.  BNP (last 3 results) No results for input(s): PROBNP in the last 8760 hours.  Lipid Profile: No results for input(s): CHOL, HDL, LDLCALC, TRIG, CHOLHDL, LDLDIRECT in the last 72 hours.  Thyroid Function Tests: No results for input(s): TSH, T4TOTAL, FREET4, T3FREE, THYROIDAB in the last 72 hours.  Anemia Panel: No results for input(s): VITAMINB12, FOLATE, FERRITIN, TIBC, IRON, RETICCTPCT in the last 72 hours.  Urine analysis:    Component Value Date/Time   COLORURINE YELLOW 08/06/2021 2300   APPEARANCEUR CLOUDY (A) 08/06/2021 2300   LABSPEC 1.021 08/06/2021 2300   PHURINE 5.0 08/06/2021 2300   GLUCOSEU NEGATIVE 08/06/2021 2300   HGBUR NEGATIVE 08/06/2021 2300   BILIRUBINUR NEGATIVE 08/06/2021 2300   KETONESUR NEGATIVE 08/06/2021 2300   PROTEINUR 30 (A) 08/06/2021 2300   NITRITE NEGATIVE 08/06/2021 2300   LEUKOCYTESUR MODERATE (A) 08/06/2021 2300    Sepsis Labs: Lactic Acid, Venous    Component Value Date/Time   LATICACIDVEN 1.7 08/07/2021 0556    MICROBIOLOGY: Recent Results (from the past 240 hour(s))  Resp Panel by RT-PCR (Flu A&B, Covid) Nasopharyngeal Swab     Status: None   Collection Time: 08/06/21 10:25 PM   Specimen:  Nasopharyngeal Swab; Nasopharyngeal(NP) swabs in vial transport medium  Result Value Ref Range Status   SARS Coronavirus 2 by RT PCR NEGATIVE NEGATIVE Final    Comment: (  NOTE) SARS-CoV-2 target nucleic acids are NOT DETECTED.  The SARS-CoV-2 RNA is generally detectable in upper respiratory specimens during the acute phase of infection. The lowest concentration of SARS-CoV-2 viral copies this assay can detect is 138 copies/mL. A negative result does not preclude SARS-Cov-2 infection and should not be used as the sole basis for treatment or other patient management decisions. A negative result may occur with  improper specimen collection/handling, submission of specimen other than nasopharyngeal swab, presence of viral mutation(s) within the areas targeted by this assay, and inadequate number of viral copies(<138 copies/mL). A negative result must be combined with clinical observations, patient history, and epidemiological information. The expected result is Negative.  Fact Sheet for Patients:  EntrepreneurPulse.com.au  Fact Sheet for Healthcare Providers:  IncredibleEmployment.be  This test is no t yet approved or cleared by the [place] FDA and  has been authorized for detection and/or diagnosis of SARS-CoV-2 by FDA under an Emergency Use Authorization (EUA). This EUA will remain  in effect (meaning this test can be used) for the duration of the COVID-19 declaration under Section 564(b)(1) of the Act, 21 U.S.C.section 360bbb-3(b)(1), unless the authorization is terminated  or revoked sooner.       Influenza A by PCR NEGATIVE NEGATIVE Final   Influenza B by PCR NEGATIVE NEGATIVE Final    Comment: (NOTE) The Xpert Xpress SARS-CoV-2/FLU/RSV plus assay is intended as an aid in the diagnosis of influenza from Nasopharyngeal swab specimens and should not be used as a sole basis for treatment. Nasal washings and aspirates are unacceptable for  Xpert Xpress SARS-CoV-2/FLU/RSV testing.  Fact Sheet for Patients: EntrepreneurPulse.com.au  Fact Sheet for Healthcare Providers: IncredibleEmployment.be  This test is not yet approved or cleared by the [place] FDA and has been authorized for detection and/or diagnosis of SARS-CoV-2 by FDA under an Emergency Use Authorization (EUA). This EUA will remain in effect (meaning this test can be used) for the duration of the COVID-19 declaration under Section 564(b)(1) of the Act, 21 U.S.C. section 360bbb-3(b)(1), unless the authorization is terminated or revoked.  Performed at [facility], [place] [address] [place], [place] [address]   Blood Culture (routine x 2)     Status: None   Collection Time: 08/06/21 10:37 PM   Specimen: BLOOD LEFT HAND  Result Value Ref Range Status   Specimen Description BLOOD LEFT HAND  Final   Special Requests   Final    BOTTLES DRAWN AEROBIC AND ANAEROBIC Blood Culture results may not be optimal due to an inadequate volume of blood received in culture bottles   Culture   Final    NO GROWTH 5 DAYS Performed at [facility], [place] [address], [place], [place] [address]    Report Status 08/11/2021 FINAL  Final  Urine Culture     Status: Abnormal   Collection Time: 08/06/21 10:41 PM   Specimen: In/Out Cath Urine  Result Value Ref Range Status   Specimen Description IN/OUT CATH URINE  Final   Special Requests   Final    NONE Performed at [facility], [place] [address] [place], [place] [address]    Culture 70,000 COLONIES/mL KLEBSIELLA PNEUMONIAE (A)  Final   Report Status 08/09/2021 FINAL  Final   Organism ID, Bacteria KLEBSIELLA PNEUMONIAE (A)  Final      Susceptibility   Klebsiella pneumoniae - MIC*    AMPICILLIN >=32 RESISTANT Resistant     CEFAZOLIN <=4 SENSITIVE Sensitive     CEFEPIME <=  0.12 SENSITIVE Sensitive     CEFTRIAXONE <=0.25 SENSITIVE Sensitive     CIPROFLOXACIN <=0.25 SENSITIVE  Sensitive     GENTAMICIN <=1 SENSITIVE Sensitive     IMIPENEM <=0.25 SENSITIVE Sensitive     NITROFURANTOIN 32 SENSITIVE Sensitive     TRIMETH/SULFA <=20 SENSITIVE Sensitive     AMPICILLIN/SULBACTAM 4 SENSITIVE Sensitive     PIP/TAZO <=4 SENSITIVE Sensitive     * 70,000 COLONIES/mL KLEBSIELLA PNEUMONIAE  Blood Culture (routine x 2)     Status: None   Collection Time: 08/06/21 11:00 PM   Specimen: BLOOD RIGHT ARM  Result Value Ref Range Status   Specimen Description BLOOD RIGHT ARM  Final   Special Requests   Final    BOTTLES DRAWN AEROBIC AND ANAEROBIC Blood Culture adequate volume   Culture   Final    NO GROWTH 5 DAYS Performed at [facility], [address] [place], [place] [address]    Report Status 08/11/2021 FINAL  Final  Culture, blood (routine x 2)     Status: None (Preliminary result)   Collection Time: 08/12/21  3:50 PM   Specimen: BLOOD LEFT HAND  Result Value Ref Range Status   Specimen Description BLOOD LEFT HAND  Final   Special Requests   Final    BOTTLES DRAWN AEROBIC AND ANAEROBIC Blood Culture adequate volume   Culture   Final    NO GROWTH 2 DAYS Performed at [facility], [place] [address] [place], [place] [address]    Report Status PENDING  Incomplete  Culture, blood (routine x 2)     Status: None (Preliminary result)   Collection Time: 08/12/21  3:50 PM   Specimen: BLOOD LEFT WRIST  Result Value Ref Range Status   Specimen Description BLOOD LEFT WRIST  Final   Special Requests   Final    BOTTLES DRAWN AEROBIC AND ANAEROBIC Blood Culture adequate volume   Culture   Final    NO GROWTH 2 DAYS Performed at [facility], [place] [address], [place], [place] [address]    Report Status PENDING  Incomplete    RADIOLOGY STUDIES/RESULTS: DG Chest Port 1V same Day  Result Date: 08/13/2021 CLINICAL DATA:  Shortness of breath. EXAM: PORTABLE CHEST 1 VIEW COMPARISON:  August 12, 2021. FINDINGS: The heart size and mediastinal contours are within  normal limits. Mild bibasilar atelectasis or infiltrates are again noted and stable. The visualized skeletal structures are unremarkable. IMPRESSION: Stable bibasilar atelectasis or infiltrates. Electronically Signed   By: Marijo Conception M.D.   On: 08/13/2021 05:27     LOS: 7 days   Oren Binet, MD  [facility]    To contact the attending provider between 7A-7P or the covering provider during after hours 7P-7A, please log into the web site [email or web address] and access using universal [place] password for that web site. If you do not have the password, please call the hospital operator.  08/14/2021, 11:45 AM

## 2021-08-14 NOTE — TOC Progression Note (Signed)
Transition of Care [facility]) - Progression Note    Patient Details  Name: Katelyn Lamb MRN: [id number] Date of Birth: 01-16-1946  Transition of Care [facility]) CM/SW [place], [place] Phone Number: 08/14/2021, 12:44 PM  Clinical Narrative:    CSW was notified by facility that they do not have a bed available today, they will update CSW when they know about tomorrow. Authorization is still good. CSW updated family, spouse had specific questions about insurance and CSW suggested he call the insurance directly. TOC will continue to follow for DC needs.   Expected Discharge Plan: [place] Barriers to Discharge: Continued Medical Work up, Ship broker  Expected Discharge Plan and Services Expected Discharge Plan: [place] Choice: [place] arrangements for the past 2 months: Single Family Home                                       Social Determinants of Health (SDOH) Interventions    Readmission Risk Interventions No flowsheet data found.

## 2021-08-14 NOTE — Care Management Important Message (Signed)
Important Message  Patient Details  Name: Katelyn Lamb MRN: [id number] Date of Birth: 1946/06/05   Medicare Important Message Given:  Yes  Patient has a contact Precaution order in place will mail to the home address.    Ormond Lazo 08/14/2021, 2:06 PM

## 2021-08-15 ENCOUNTER — Inpatient Hospital Stay (HOSPITAL_COMMUNITY): Payer: PPO

## 2021-08-15 DIAGNOSIS — Y95 Nosocomial condition: Secondary | ICD-10-CM

## 2021-08-15 LAB — CBC
HCT: 30.7 % — ABNORMAL LOW (ref 36.0–46.0)
Hemoglobin: 9.3 g/dL — ABNORMAL LOW (ref 12.0–15.0)
MCH: 29.4 pg (ref 26.0–34.0)
MCHC: 30.3 g/dL (ref 30.0–36.0)
MCV: 97.2 fL (ref 80.0–100.0)
Platelets: 223 10*3/uL (ref 150–400)
RBC: 3.16 MIL/uL — ABNORMAL LOW (ref 3.87–5.11)
RDW: 17.2 % — ABNORMAL HIGH (ref 11.5–15.5)
WBC: 10.5 10*3/uL (ref 4.0–10.5)
nRBC: 0 % (ref 0.0–0.2)

## 2021-08-15 MED ORDER — GUAIFENESIN ER 600 MG PO TB12: 1200.0000 mg | ORAL_TABLET | Freq: Two times a day (BID) | ORAL | Status: DC

## 2021-08-15 MED ORDER — BENZONATATE 200 MG PO CAPS
200.0000 mg | ORAL_CAPSULE | Freq: Three times a day (TID) | ORAL | 0 refills | Status: DC
Start: 2021-08-15 — End: 2021-09-15

## 2021-08-15 MED ORDER — DIAZEPAM 5 MG PO TABS: 5.0000 mg | ORAL_TABLET | Freq: Two times a day (BID) | ORAL | 0 refills | Status: DC | PRN

## 2021-08-15 MED ORDER — APIXABAN 5 MG PO TABS: 5.0000 mg | ORAL_TABLET | Freq: Two times a day (BID) | ORAL | Status: DC

## 2021-08-15 MED ORDER — CEFDINIR 300 MG PO CAPS: 300.0000 mg | ORAL_CAPSULE | Freq: Two times a day (BID) | ORAL | 0 refills | Status: AC

## 2021-08-15 MED ORDER — IPRATROPIUM-ALBUTEROL 0.5-2.5 (3) MG/3ML IN SOLN: 3.0000 mL | Freq: Three times a day (TID) | RESPIRATORY_TRACT | Status: DC

## 2021-08-15 MED ORDER — ENSURE ENLIVE PO LIQD: 237.0000 mL | Freq: Two times a day (BID) | ORAL | 12 refills | Status: DC

## 2021-08-15 MED ORDER — ALBUTEROL SULFATE (2.5 MG/3ML) 0.083% IN NEBU: 2.5000 mg | INHALATION_SOLUTION | RESPIRATORY_TRACT | 12 refills | Status: AC | PRN

## 2021-08-15 NOTE — TOC Progression Note (Addendum)
Transition of Care [facility]) - Progression Note    Patient Details  Name: Katelyn Lamb MRN: [id number] Date of Birth: 03/29/1946  Transition of Care [facility]) CM/SW [place], LCSW Phone Number: 08/15/2021, 9:29 AM  Clinical Narrative:    [facility] able to [facility] patient today after 3pm. CSW confirmed with [facility] that Josem Kaufmann is still good.    Expected Discharge Plan: [place] Barriers to Discharge: Continued Medical Work up, Ship broker  Expected Discharge Plan and Services Expected Discharge Plan: [place] Choice: [place] arrangements for the past 2 months: Single Family Home                                       Social Determinants of Health (SDOH) Interventions    Readmission Risk Interventions No flowsheet data found.

## 2021-08-15 NOTE — Discharge Summary (Signed)
PATIENT DETAILS Name: Katelyn Lamb Age: 75 y.o. Sex: female Date of Birth: 15-Aug-1946 MRN: [id number]. Admitting Physician: Rhetta Mura, DO LMB:[id number], Dibas, MD  Admit Date: 08/06/2021 Discharge date: 08/15/2021  Recommendations for Outpatient Follow-up:  Follow up with PCP in 1-2 weeks Please obtain CMP/CBC in one week Please ensure follow-up by primary neurologist.  Admitted From:  SNF  Disposition: SNF   Home Health: No  Equipment/Devices: Oxygen 2 L/min  Discharge Condition: Stable  CODE STATUS: DNR  Diet recommendation:  Diet Order             Diet - low sodium heart healthy           Diet regular Room service appropriate? Yes with Assist; Fluid consistency: Thin  Diet effective now                    Brief Summary: Patient is a 75 y.o. female with history of prior VTE, seizure disorder, HSV encephalomyelitis in 2018 with baseline gait dysfunction requiring a walker-recent hospitalization from 11/11-11/30  UTI and epistaxis requiring intubation and embolization of the left sphenopalatine artery by IR (off Xarelto since then)-presented with shortness of breath-found to have PE and right lower extremity DVT.  Started on IV heparin and subsequently transition to oral Eliquis-however for the hospital course complicated by development of HCAP.  See below for further details.  Brief Hospital Course: Acute hypoxic respiratory failure due to PE and HCAP: Overall much improved with anticoagulation and antibiotics.  On minimal amounts of oxygen at rest-but due to severe deconditioning/large clot burden with PE-probably will require more oxygen with ambulation.     Bilateral lobar, segmental and subsegmental PE with right lower extremity DVT: Initially on IV heparin-subsequently transition to Eliquis on 12/7.  Seems to be tolerating anticoagulation well without any major bleeding issues.     Note on 12/7-this MD had extensive discussion regarding  risks/benefits of long-term anticoagulation with patient/spouse (at bedside)/son (over the phone)-all understand that benefits outweigh risks-all accept risks including catastrophic bleeding.  Subsequently discussed different options of long-term anticoagulation-Eliquis/Xarelto versus Coumadin.  Patient/family elected to try Eliquis.   HCAP: Developed cough/congestion/worsening leukocytosis-CXR on 12/10 with worsening bibasilar infiltrates-subsequently started on vancomycin and cefepime.  Use of incentive spirometry/flutter valve was encouraged-she was asked to sit up and eat-and mobilize as much as possible.  With these measures-she has significantly improved-her chest congestion is significantly better-she has some few rales in her base of her lungs bilaterally but much better than the past few days.  Repeat chest x-ray today shows stability.  Leukocytosis has resolved-initially on vancomycin but this was discontinued-remains on cefepime-hence we will transition to oral Omnicef on discharge.  Blood cultures remain negative.    Portal venous gas in the liver: Seen incidentally on CT imaging-no pneumatosis seen on CT imaging-abdominal exam remains benign-unclear significance of this finding.  Briefly discussed with surgery team-recommendations are to continue with supportive care-given that abdominal exam remains benign and leukocytosis has resolved spontaneously.   Asymptomatic bacteriuria: Likely colonization-plan was to monitor off antimicrobial therapy-but eventually got started on antibiotics for PNA.   Seizure disorder: Continue Keppra   GERD: Continue PPI   History of HSV encephalomyelitis with chronic gait dysfunction: Weakness has worsened due to acute hospitalization-plans of SNF on discharge.   Neurogenic bladder with chronic indwelling suprapubic catheter: Due to sequelae of HSV encephalomyelitis   Chronic steroid use: On usual dosing of prednisone.  Per patient/family this was started by  her  neurologist several months ago for her underlying history of encephalomyelitis.   BMI Estimated body mass index is 26.28 kg/m as calculated from the following:   Height as of this encounter: '5\' 7"'  (1.702 m).   Weight as of this encounter: 76.1 kg.   Nutrition Status: Nutrition Problem: Increased nutrient needs Etiology: acute illness Signs/Symptoms: estimated needs Interventions: Ensure Enlive (each supplement provides 350kcal and 20 grams of protein), MVI, Liberalize Diet    RN pressure injury documentation: Pressure Injury 02/12/20 Heel Right Deep Tissue Pressure Injury - Purple or maroon localized area of discolored intact skin or blood-filled blister due to damage of underlying soft tissue from pressure and/or shear. (Active)  02/12/20 0945  Location: Heel  Location Orientation: Right  Staging: Deep Tissue Pressure Injury - Purple or maroon localized area of discolored intact skin or blood-filled blister due to damage of underlying soft tissue from pressure and/or shear.  Wound Description (Comments):   Present on Admission: Yes    Procedures/ None  Discharge Diagnoses:  Principal Problem:   Acute pulmonary embolism (HCC) Active Problems:   Abdominal pain   Elevated troponin   Lactic acidosis   Seizure disorder (HCC)   GERD (gastroesophageal reflux disease)   Discharge Instructions:  Activity:  As tolerated with Full fall precautions use walker/cane & assistance as needed   Discharge Instructions     Call MD for:  difficulty breathing, headache or visual disturbances   Complete by: As directed    Diet - low sodium heart healthy   Complete by: As directed    Discharge instructions   Complete by: As directed    Follow with Primary MD  Koirala, Dibas, MD in 1-2 weeks  Please get a complete blood count and chemistry panel checked by your Primary MD at your next visit, and again as instructed by your Primary MD.  Get Medicines reviewed and adjusted: Please  take all your medications with you for your next visit with your Primary MD  Laboratory/radiological data: Please request your Primary MD to go over all hospital tests and procedure/radiological results at the follow up, please ask your Primary MD to get all Hospital records sent to his/her office.  In some cases, they will be blood work, cultures and biopsy results pending at the time of your discharge. Please request that your primary care M.D. follows up on these results.  Also Note the following: If you experience worsening of your admission symptoms, develop shortness of breath, life threatening emergency, suicidal or homicidal thoughts you must seek medical attention immediately by calling 911 or calling your MD immediately  if symptoms less severe.  You must read complete instructions/literature along with all the possible adverse reactions/side effects for all the Medicines you take and that have been prescribed to you. Take any new Medicines after you have completely understood and accpet all the possible adverse reactions/side effects.   Do not drive when taking Pain medications or sleeping medications (Benzodaizepines)  Do not take more than prescribed Pain, Sleep and Anxiety Medications. It is not advisable to combine anxiety,sleep and pain medications without talking with your primary care practitioner  Special Instructions: If you have smoked or chewed Tobacco  in the last 2 yrs please stop smoking, stop any regular Alcohol  and or any Recreational drug use.  Wear Seat belts while driving.  Please note: You were cared for by a hospitalist during your hospital stay. Once you are discharged, your primary care physician will handle any further medical issues.  Please note that NO REFILLS for any discharge medications will be authorized once you are discharged, as it is imperative that you return to your primary care physician (or establish a relationship with a primary care physician  if you do not have one) for your post hospital discharge needs so that they can reassess your need for medications and monitor your lab values.   1.  Sit up and eat at all times  2.  Use incentive spirometry/flutter valve-at least 5 times an hour while awake.   Increase activity slowly   Complete by: As directed       Allergies as of 08/15/2021       Reactions   Demerol [meperidine] Other (See Comments)   Hallucinations   Percocet [oxycodone-acetaminophen] Itching   Amoxicillin-pot Clavulanate Diarrhea   Severe pain, headache, intestinal infection   Penicillins Itching, Rash   Tolerated amoxicillin November 2022  Has patient had a PCN reaction causing immediate rash, facial/tongue/throat swelling, SOB or lightheadedness with hypotension:  NO Has patient had a PCN reaction causing severe rash involving mucus membranes or skin necrosis: No Has patient had a PCN reaction that required hospitalization: No Has patient had a PCN reaction occurring within the last 10 years: Yes If all of the above answers are "NO", then may proceed with Cephalosporin use.        Medication List     STOP taking these medications    HYDROcodone-acetaminophen 10-325 MG tablet Commonly known as: NORCO       TAKE these medications    acyclovir 400 MG tablet Commonly known as: ZOVIRAX TAKE 1 TABLET BY MOUTH TWICE A DAY   albuterol (2.5 MG/3ML) 0.083% nebulizer solution Commonly known as: PROVENTIL Take 3 mLs (2.5 mg total) by nebulization every 2 (two) hours as needed for wheezing or shortness of breath.   apixaban 5 MG Tabs tablet Commonly known as: ELIQUIS Take 1 tablet (5 mg total) by mouth 2 (two) times daily.   benzonatate 200 MG capsule Commonly known as: TESSALON Take 1 capsule (200 mg total) by mouth 3 (three) times daily.   CALCIUM 600 + D PO Take 1 tablet by mouth in the morning and at bedtime.   cefdinir 300 MG capsule Commonly known as: OMNICEF Take 1 capsule (300 mg  total) by mouth 2 (two) times daily for 2 days.   diazepam 5 MG tablet Commonly known as: VALIUM Take 1 tablet (5 mg total) by mouth every 12 (twelve) hours as needed for anxiety or muscle spasms. For tremors What changed:  when to take this reasons to take this   docusate sodium 100 MG capsule Commonly known as: COLACE Take 200 mg by mouth daily.   DULoxetine 30 MG capsule Commonly known as: CYMBALTA Take 30 mg by mouth in the morning.   DULoxetine 60 MG capsule Commonly known as: CYMBALTA Take 1 capsule (60 mg total) by mouth at bedtime.   feeding supplement Liqd Take 237 mLs by mouth 2 (two) times daily between meals.   ferrous sulfate 325 (65 FE) MG tablet Take 325 mg by mouth every morning.   guaiFENesin 600 MG 12 hr tablet Commonly known as: MUCINEX Take 2 tablets (1,200 mg total) by mouth 2 (two) times daily.   ipratropium-albuterol 0.5-2.5 (3) MG/3ML Soln Commonly known as: DUONEB Take 3 mLs by nebulization 3 (three) times daily.   levETIRAcetam 250 MG tablet Commonly known as: KEPPRA TAKE 1 TABLET BY MOUTH TWICE A DAY   loperamide 2 MG  capsule Commonly known as: IMODIUM Take 2 mg by mouth daily as needed for diarrhea or loose stools.   mirabegron ER 50 MG Tb24 tablet Commonly known as: MYRBETRIQ Take 50 mg by mouth at bedtime.   omega-3 acid ethyl esters 1 g capsule Commonly known as: LOVAZA Take 1 g by mouth every morning.   pantoprazole 40 MG tablet Commonly known as: PROTONIX Take 1 tablet (40 mg total) by mouth daily.   polyethylene glycol 17 g packet Commonly known as: MIRALAX / GLYCOLAX Take 17 g by mouth daily.   predniSONE 5 MG tablet Commonly known as: DELTASONE TAKE 1 TABLET BY MOUTH EVERY DAY WITH BREAKFAST What changed:  See the new instructions. Another medication with the same name was removed. Continue taking this medication, and follow the directions you see here.   pregabalin 50 MG capsule Commonly known as: LYRICA Take 2  capsules (100 mg total) by mouth at bedtime.   QUEtiapine 100 MG tablet Commonly known as: SEROQUEL Take 100 mg by mouth at bedtime.   sodium chloride 0.65 % Soln nasal spray Commonly known as: OCEAN Place 1 spray into both nostrils as needed for congestion.   VITAMIN B-12 PO Take 500 mcg by mouth daily.   Vitamin C 500 MG Chew Chew 500 mg by mouth daily.        Follow-up Information     Koirala, Dibas, MD. Schedule an appointment as soon as possible for a visit in 1 week(s).   Specialty: Family Medicine Contact information: [place] [address] [place] Follow up in 2 week(s).   Contact information: [address]     [address]  [place] [address] [phone]               Allergies  Allergen Reactions   Demerol [Meperidine] Other (See Comments)    Hallucinations   Percocet [Oxycodone-Acetaminophen] Itching   Amoxicillin-Pot Clavulanate Diarrhea    Severe pain, headache, intestinal infection   Penicillins Itching and Rash    Tolerated amoxicillin November 2022  Has patient had a PCN reaction causing immediate rash, facial/tongue/throat swelling, SOB or lightheadedness with hypotension:  NO Has patient had a PCN reaction causing severe rash involving mucus membranes or skin necrosis: No Has patient had a PCN reaction that required hospitalization: No Has patient had a PCN reaction occurring within the last 10 years: Yes If all of the above answers are "NO", then may proceed with Cephalosporin use.      Consultations:  pulmonary/intensive care   Other Procedures/Studies: DG Abd 1 View  Result Date: 08/07/2021 CLINICAL DATA:  Abdominal distention EXAM: ABDOMEN - 1 VIEW COMPARISON:  KUB 07/18/2021, same-day CT abdomen/pelvis FINDINGS: There is significant gaseous distention of the large bowel in the right hemiabdomen, similar to the same day CT  abdomen/pelvis. There is no significant small bowel dilation. There is no definite free intraperitoneal air, suboptimally assessed. There is no abnormal soft tissue calcification. Post kyphoplasty changes are seen in the lumbar spine. IMPRESSION: Moderate dilation of the large bowel in the right hemiabdomen, similar to the same-day CT abdomen/pelvis. Findings may reflect ileus. Electronically Signed   By: Valetta Mole M.D.   On: 08/07/2021 10:14   CT Angio Chest PE W and/or Wo Contrast  Result Date: 08/07/2021 CLINICAL DATA:  High probability of pulmonary thromboemboli. Abdominal distention also. EXAM: CT ANGIOGRAPHY CHEST CT ABDOMEN AND PELVIS WITH CONTRAST TECHNIQUE:  Multidetector CT imaging of the chest was performed using the standard protocol during bolus administration of intravenous contrast. Multiplanar CT image reconstructions and MIPs were obtained to evaluate the vascular anatomy. Multidetector CT imaging of the abdomen and pelvis was performed using the standard protocol during bolus administration of intravenous contrast. CONTRAST:  136m OMNIPAQUE IOHEXOL 350 MG/ML SOLN COMPARISON:  None. CTA chest 02/12/2020 and 07/23/2019, portable chest today and 07/27/2021, CT abdomen and pelvis without contrast 04/14/2021, CT abdomen pelvis with contrast 11/22/2015. FINDINGS: CTA CHEST FINDINGS Cardiovascular: Prominent pulmonary trunk 3.1 cm indicating arterial hypertension, with slightly prominent right heart chambers and IVC reflux compatible with right heart strain. There are bilateral nonocclusive emboli in the distal main pulmonary arteries with near-occlusive thrombus in the interlobar artery and lateral and medial segmental right middle lobe arteries. There is occluding thrombus in the right upper lobe apical segmental and subsegmental arteries, nonocclusive thrombus in the right upper lobe anterior segmental artery. There are multiple segmental and subsegmental right lower lobe arteries with  nonocclusive thrombus, and additional nonoccluding thrombus in the left upper lobe anterior segmental artery and the left lower lobe main and 2 segmental arteries to the posterior and medial left base. There is moderate overall embolic burden which appears acute. There is mild cardiomegaly with slight right chamber predominance, trace calcification LAD coronary artery, small amount of scattered aortic calcification and mild aortic ectasia and tortuosity. There is no aortic aneurysm or dissection, with normal great vessel branching and opacification. There is no venous dilatation no pericardial effusion. Mediastinum/Nodes: No enlarged mediastinal, hilar, or axillary lymph nodes. Thyroid gland, trachea, and esophagus demonstrate no significant findings. Lungs/Pleura: No pleural effusion or pneumothorax. Lung bases show posterior atelectasis. There is interstitial haziness in the medial base of the right upper lobe which could be due to low lung volumes and micro atelectasis or pneumonitis. There are small caliber central airways and trachea which could indicate bronchospasm or be due to expiration. Remaining lungs are clear. Musculoskeletal: Osteopenia and degenerative change with mild dextroscoliosis thoracic spine. No concerning regional skeletal lesion. Review of the MIP images confirms the above findings. CT ABDOMEN and PELVIS FINDINGS Factors affecting image quality in the abdomen: Respiratory motion artifact. Hepatobiliary: There is no mass enhancement, but there is portal venous gas in the left hepatic lobe near the dome and along the inter segmental division but not in the main portal vein. Etiology is unclear. Gallbladder and bile ducts unremarkable. Pancreas: Mildly prominent pancreatic duct which has been seen on prior studies. No mass enhancement or interval change. Spleen: Normal Adrenals/Urinary Tract: Unremarkable adrenal glands and renal cortex. No calculus or hydronephrosis. The bladder is contracted  around a suprapubic Foley catheter and not well seen. Stomach/Bowel: The stomach is moderately distended with air and fluid. There is dilatation in the descending duodenum up to 3.2 cm to the right of the aorta, with duodenum and jejunum decompressed left of the aorta. Small bowel is otherwise unremarkable with a normal appendix. There is air and fluid distention of the ascending and transverse colon with the cecum up to 10.3 cm in diameter but no wall thickening. No other colonic dilatation is seen. There is fluid throughout the colon. No obstructing mass is visible. Vascular/Lymphatic: Aortic atherosclerosis. No enlarged abdominal or pelvic lymph nodes. Reproductive: The uterus is absent. No adnexal lesion is seen. Multiple pelvic phleboliths. Other: There is no free air, hemorrhage, fluid or bowel pneumatosis. Musculoskeletal: Osteopenia and compression fractures again noted at T12, L2, L3, L4 and L5,  unchanged compared with lumbar spine MRI 07/15/2021 with kyphoplasty cement again seen at L2 and 5. There are degenerative changes of the lumbar spine, old healed left inferior pubic ramus fracture. IMPRESSION: 1. Bilateral lobar, segmental and subsegmental pulmonary arterial emboli, some of which are occlusive, with overall moderate clot burden and mild features of right heart strain. 2. Aortic atherosclerosis. 3. Cardiomegaly with scattered calcification LAD coronary artery. 4. Interstitial haziness medially in the right upper lobe which could relate to low lung volumes or pneumonitis. 5. Portal venous gas in the liver, unknown etiology. This may be seen with necrotic bowel but there are no thickened segments or segments showing pneumatosis. There is a distended stomach and proximal duodenum with air and fluid which could be due to an ileus or intermittent SMA compression syndrome. There is also mild dilatation and fluid filling in the ascending colon without a visible obstructing mass or small bowel dilatation in  the remainder of the abdomen and pelvis. Adynamic ileus may be present with the portal venous gas related to dilatation in the stomach and/or ascending colon. Fluid is seen throughout the colon without wall thickening or inflammation. Close clinical follow-up and laboratory correlation recommended. 6. Osteopenia with lumbar and T12 compression fractures similar in appearance to an MRI of 07/15/2021. 7. Results phoned to Dr. Darl Householder at 1:01 a.m., 08/07/2021. Electronically Signed   By: Telford Nab M.D.   On: 08/07/2021 01:09   CT ABDOMEN PELVIS W CONTRAST  Result Date: 08/07/2021 CLINICAL DATA:  High probability of pulmonary thromboemboli. Abdominal distention also. EXAM: CT ANGIOGRAPHY CHEST CT ABDOMEN AND PELVIS WITH CONTRAST TECHNIQUE: Multidetector CT imaging of the chest was performed using the standard protocol during bolus administration of intravenous contrast. Multiplanar CT image reconstructions and MIPs were obtained to evaluate the vascular anatomy. Multidetector CT imaging of the abdomen and pelvis was performed using the standard protocol during bolus administration of intravenous contrast. CONTRAST:  176m OMNIPAQUE IOHEXOL 350 MG/ML SOLN COMPARISON:  None. CTA chest 02/12/2020 and 07/23/2019, portable chest today and 07/27/2021, CT abdomen and pelvis without contrast 04/14/2021, CT abdomen pelvis with contrast 11/22/2015. FINDINGS: CTA CHEST FINDINGS Cardiovascular: Prominent pulmonary trunk 3.1 cm indicating arterial hypertension, with slightly prominent right heart chambers and IVC reflux compatible with right heart strain. There are bilateral nonocclusive emboli in the distal main pulmonary arteries with near-occlusive thrombus in the interlobar artery and lateral and medial segmental right middle lobe arteries. There is occluding thrombus in the right upper lobe apical segmental and subsegmental arteries, nonocclusive thrombus in the right upper lobe anterior segmental artery. There are  multiple segmental and subsegmental right lower lobe arteries with nonocclusive thrombus, and additional nonoccluding thrombus in the left upper lobe anterior segmental artery and the left lower lobe main and 2 segmental arteries to the posterior and medial left base. There is moderate overall embolic burden which appears acute. There is mild cardiomegaly with slight right chamber predominance, trace calcification LAD coronary artery, small amount of scattered aortic calcification and mild aortic ectasia and tortuosity. There is no aortic aneurysm or dissection, with normal great vessel branching and opacification. There is no venous dilatation no pericardial effusion. Mediastinum/Nodes: No enlarged mediastinal, hilar, or axillary lymph nodes. Thyroid gland, trachea, and esophagus demonstrate no significant findings. Lungs/Pleura: No pleural effusion or pneumothorax. Lung bases show posterior atelectasis. There is interstitial haziness in the medial base of the right upper lobe which could be due to low lung volumes and micro atelectasis or pneumonitis. There are small caliber central  airways and trachea which could indicate bronchospasm or be due to expiration. Remaining lungs are clear. Musculoskeletal: Osteopenia and degenerative change with mild dextroscoliosis thoracic spine. No concerning regional skeletal lesion. Review of the MIP images confirms the above findings. CT ABDOMEN and PELVIS FINDINGS Factors affecting image quality in the abdomen: Respiratory motion artifact. Hepatobiliary: There is no mass enhancement, but there is portal venous gas in the left hepatic lobe near the dome and along the inter segmental division but not in the main portal vein. Etiology is unclear. Gallbladder and bile ducts unremarkable. Pancreas: Mildly prominent pancreatic duct which has been seen on prior studies. No mass enhancement or interval change. Spleen: Normal Adrenals/Urinary Tract: Unremarkable adrenal glands and  renal cortex. No calculus or hydronephrosis. The bladder is contracted around a suprapubic Foley catheter and not well seen. Stomach/Bowel: The stomach is moderately distended with air and fluid. There is dilatation in the descending duodenum up to 3.2 cm to the right of the aorta, with duodenum and jejunum decompressed left of the aorta. Small bowel is otherwise unremarkable with a normal appendix. There is air and fluid distention of the ascending and transverse colon with the cecum up to 10.3 cm in diameter but no wall thickening. No other colonic dilatation is seen. There is fluid throughout the colon. No obstructing mass is visible. Vascular/Lymphatic: Aortic atherosclerosis. No enlarged abdominal or pelvic lymph nodes. Reproductive: The uterus is absent. No adnexal lesion is seen. Multiple pelvic phleboliths. Other: There is no free air, hemorrhage, fluid or bowel pneumatosis. Musculoskeletal: Osteopenia and compression fractures again noted at T12, L2, L3, L4 and L5, unchanged compared with lumbar spine MRI 07/15/2021 with kyphoplasty cement again seen at L2 and 5. There are degenerative changes of the lumbar spine, old healed left inferior pubic ramus fracture. IMPRESSION: 1. Bilateral lobar, segmental and subsegmental pulmonary arterial emboli, some of which are occlusive, with overall moderate clot burden and mild features of right heart strain. 2. Aortic atherosclerosis. 3. Cardiomegaly with scattered calcification LAD coronary artery. 4. Interstitial haziness medially in the right upper lobe which could relate to low lung volumes or pneumonitis. 5. Portal venous gas in the liver, unknown etiology. This may be seen with necrotic bowel but there are no thickened segments or segments showing pneumatosis. There is a distended stomach and proximal duodenum with air and fluid which could be due to an ileus or intermittent SMA compression syndrome. There is also mild dilatation and fluid filling in the  ascending colon without a visible obstructing mass or small bowel dilatation in the remainder of the abdomen and pelvis. Adynamic ileus may be present with the portal venous gas related to dilatation in the stomach and/or ascending colon. Fluid is seen throughout the colon without wall thickening or inflammation. Close clinical follow-up and laboratory correlation recommended. 6. Osteopenia with lumbar and T12 compression fractures similar in appearance to an MRI of 07/15/2021. 7. Results phoned to Dr. Darl Householder at 1:01 a.m., 08/07/2021. Electronically Signed   By: Telford Nab M.D.   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 Presented to there ED at [facility] with generalized weakness, and difficulty ambulating. Patient developed acute bleeding of the left nare on 11.14.22 at 22:30. Rhinorocket placed for hemostasis was unsuccessful. 8 cm Merocel sponge and Afrin attempted by ENT also unsuccessful. ENT  placed a Epistat catheter with 19 ml balloon. She comes to our service for a cerebral angiogram with endovascular epistaxis embolization. EXAM: ULTRASOUND-GUIDED VASCULAR ACCESS DIAGNOSTIC CEREBRAL ANGIOGRAM VASCULAR EMBOLIZATION OF BILATERAL SPHENOPALATINE ARTERIES COMPARISON:  None. MEDICATIONS: Ancef 2 gm IV. The antibiotic was administered within 1 hour of the procedure. ANESTHESIA/SEDATION: The procedure was performed under general anesthesia. CONTRAST:  170 mL of Omnipaque 300 milligram/mL FLUOROSCOPY TIME:  Fluoroscopy Time: 45 minutes 42 seconds (1979 mGy). COMPLICATIONS: None immediate. TECHNIQUE: Informed written consent was obtained from the patient and her husband after a thorough discussion of the procedural risks, benefits and alternatives. All questions were addressed.  Maximal Sterile Barrier Technique was utilized including caps, mask, sterile gowns, sterile gloves, sterile drape, hand hygiene and skin antiseptic. A timeout was performed prior to the initiation of the procedure. The right groin was prepped and draped in the usual sterile fashion. Using a micropuncture kit and the modified Seldinger technique, access was gained to the right common femoral artery and an 8 French sheath was placed. Real-time ultrasound guidance was utilized for vascular access including the acquisition of a permanent ultrasound image documenting patency of the accessed vessel. Under fluoroscopy, a 4 [place] Berenstein 2 catheter was navigated over a 0.035" Terumo Glidewire into the aortic arch. The catheter was placed into the left common carotid artery. Frontal and lateral angiograms of the neck were obtained. The catheter was then placed into the left internal carotid artery. Frontal and lateral angiograms of the head were obtained. Next, the catheter was placed into the left external carotid artery. Frontal and lateral angiograms of the neck and head/face were obtained. Left sphenopalatine artery embolization was then carried out as described below. Next, the Envoy catheter was retracted into the aortic arch and then navigated to the right common carotid artery. Frontal and lateral angiograms of the neck were obtained. The catheter was then placed into the right internal carotid artery. Frontal and lateral angiograms of the head were obtained. Attempts to navigate the Envoy catheter into the right external carotid artery proved unsuccessful due to branch takeoff angle. The Envoy catheter was then exchanged over the wire and under biplane roadmap for a 4 [place] Berenstein catheter. Next, the catheter was placed into the right external carotid artery. Frontal and lateral angiograms of the neck and head/face were obtained. The Berenstein 2 catheter was then exchanged over the wire and under biplane  roadmap for a 6 [place] envoy which was placed into into the right external carotid artery. Right sphenopalatine artery embolization was then carried out as described below. The catheter was subsequently withdrawn. A right common femoral artery angiogram was obtained in right anterior oblique view. FINDINGS: 1. Normal caliber of the right common femoral artery, adequate for vascular access and closure device utilization. 2. Prominent mucosal blush in the nasal cavity on the left with contrast pooling in the inferior and middle meatus. 3. Normal appearing right nasal cavity mucosal blush. 4. Normal appearance of the bilateral carotid bifurcation without hemodynamically significant stenosis. 5. Brisk contrast opacification of the bilateral MCA and ACA vascular tree without significant vascular abnormality. PROCEDURE: After left-sided diagnostic angiogram, the Berenstein 2 catheter was exchanged over the wire and biplane roadmap for a 6 French envoy DA which was placed into the left external carotid artery. Magnified frontal and lateral angiograms of the face were obtained. Using biplane roadmap, a Prowler select Plus microcatheter was navigated over a synchro 2 micro guidewire into the left sphenopalatine artery. Magnified frontal and lateral angiograms  were obtained via microcatheter contrast injection. Subsequently, left sphenopalatine artery embolization was carried out with 250-350 microns PVA particles suspension with iodine contrast for opacification. Follow-up angiogram showed near complete embolization. The catheter was slightly pulled back more proximally and additional embolization with PVA particles was performed. The microcatheter was retracted under continuous aspiration. 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 The Envoy catheter was then placed into the left internal carotid artery. Frontal and lateral angiograms of the head were obtained with no evidence  of nontarget embolization. Mild nasal septum blush is noted via ethmoidal branches of the left ophthalmic artery. After right-sided diagnostic angiogram, a Prowler select Plus microcatheter was navigated over a synchro 2 micro guidewire into the right sphenopalatine artery under biplane roadmap guidance. Magnified frontal and lateral angiograms were obtained via microcatheter contrast injection. Subsequently, right sphenopalatine artery embolization was carried out with 250-350 microns PVA particles suspension with iodine contrast for opacification. 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 The Envoy catheter was then placed into the right internal carotid artery. Frontal and lateral angiograms of the head were obtained with no evidence of nontarget embolization. At the end of the procedure, the 8 French right common femoral artery sheath was exchanged over the wire for a Perclose ProGlide which was utilized for access closure. Immediate hemostasis was achieved. IMPRESSION: 1. Prominent mucosal blush in the left nasal cavity with active contrast extravasation. 2. Successful and uncomplicated endovascular embolization of the bilateral sphenopalatine arteries with polyvinyl alcohol particles for treatment of epistaxis refractory to nasal packing. 3. Residual mucosal blush of the nasal septum via anterior ethmoidal branches of the left ophthalmic artery. PLAN: Patient will remain intubated due to aspiration risk and will be transferred to ICU for continued management. Electronically Signed   By: Pedro Earls M.D.   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 Presented to there ED at [facility] with  generalized weakness, and difficulty ambulating. Patient developed acute bleeding of the left nare on 11.14.22 at 22:30. Rhinorocket placed for hemostasis was unsuccessful. 8 cm Merocel sponge and Afrin attempted by ENT also unsuccessful. ENT placed a Epistat catheter with 19 ml balloon. She comes to our service for a cerebral angiogram with endovascular epistaxis embolization. EXAM: ULTRASOUND-GUIDED VASCULAR ACCESS DIAGNOSTIC CEREBRAL ANGIOGRAM VASCULAR EMBOLIZATION OF BILATERAL SPHENOPALATINE ARTERIES COMPARISON:  None. MEDICATIONS: Ancef 2 gm IV. The antibiotic was administered within 1 hour of the procedure. ANESTHESIA/SEDATION: The procedure was performed under general anesthesia. CONTRAST:  170 mL of Omnipaque 300 milligram/mL FLUOROSCOPY TIME:  Fluoroscopy Time: 45 minutes 42 seconds (1979 mGy). COMPLICATIONS: None immediate. TECHNIQUE: Informed written consent was obtained from the patient and her husband after a thorough discussion of the procedural risks, benefits and alternatives. All questions were addressed. Maximal Sterile Barrier Technique was utilized including caps, mask, sterile gowns, sterile gloves, sterile drape, hand hygiene and skin antiseptic. A timeout was performed prior to the initiation of the procedure. The right groin was prepped and draped in the usual sterile fashion. Using a micropuncture kit and the modified Seldinger technique, access was gained to the right common femoral artery and an 8 French sheath was placed. Real-time ultrasound guidance was utilized for vascular access including the acquisition of a permanent ultrasound  image documenting patency of the accessed vessel. Under fluoroscopy, a 4 [place] Berenstein 2 catheter was navigated over a 0.035" Terumo Glidewire into the aortic arch. The catheter was placed into the left common carotid artery. Frontal and lateral angiograms of the neck were obtained. The catheter was then placed into the left internal carotid artery.  Frontal and lateral angiograms of the head were obtained. Next, the catheter was placed into the left external carotid artery. Frontal and lateral angiograms of the neck and head/face were obtained. Left sphenopalatine artery embolization was then carried out as described below. Next, the Envoy catheter was retracted into the aortic arch and then navigated to the right common carotid artery. Frontal and lateral angiograms of the neck were obtained. The catheter was then placed into the right internal carotid artery. Frontal and lateral angiograms of the head were obtained. Attempts to navigate the Envoy catheter into the right external carotid artery proved unsuccessful due to branch takeoff angle. The Envoy catheter was then exchanged over the wire and under biplane roadmap for a 4 [place] Berenstein catheter. Next, the catheter was placed into the right external carotid artery. Frontal and lateral angiograms of the neck and head/face were obtained. The Berenstein 2 catheter was then exchanged over the wire and under biplane roadmap for a 6 [place] envoy which was placed into into the right external carotid artery. Right sphenopalatine artery embolization was then carried out as described below. The catheter was subsequently withdrawn. A right common femoral artery angiogram was obtained in right anterior oblique view. FINDINGS: 1. Normal caliber of the right common femoral artery, adequate for vascular access and closure device utilization. 2. Prominent mucosal blush in the nasal cavity on the left with contrast pooling in the inferior and middle meatus. 3. Normal appearing right nasal cavity mucosal blush. 4. Normal appearance of the bilateral carotid bifurcation without hemodynamically significant stenosis. 5. Brisk contrast opacification of the bilateral MCA and ACA vascular tree without significant vascular abnormality. PROCEDURE: After left-sided diagnostic angiogram, the Berenstein 2 catheter was exchanged  over the wire and biplane roadmap for a 6 French envoy DA which was placed into the left external carotid artery. Magnified frontal and lateral angiograms of the face were obtained. Using biplane roadmap, a Prowler select Plus microcatheter was navigated over a synchro 2 micro guidewire into the left sphenopalatine artery. Magnified frontal and lateral angiograms were obtained via microcatheter contrast injection. Subsequently, left sphenopalatine artery embolization was carried out with 250-350 microns PVA particles suspension with iodine contrast for opacification. Follow-up angiogram showed near complete embolization. The catheter was slightly pulled back more proximally and additional embolization with PVA particles was performed. The microcatheter was retracted under continuous aspiration. 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 The Envoy catheter was then placed into the left internal carotid artery. Frontal and lateral angiograms of the head were obtained with no evidence of nontarget embolization. Mild nasal septum blush is noted via ethmoidal branches of the left ophthalmic artery. After right-sided diagnostic angiogram, a Prowler select Plus microcatheter was navigated over a synchro 2 micro guidewire into the right sphenopalatine artery under biplane roadmap guidance. Magnified frontal and lateral angiograms were obtained via microcatheter contrast injection. Subsequently, right sphenopalatine artery embolization was carried out with 250-350 microns PVA particles suspension with iodine contrast for opacification. 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 The Envoy catheter was then placed into the right internal carotid artery. Frontal and lateral angiograms of  the head were obtained with no evidence of nontarget embolization. At the end of the procedure, the 8 French right common femoral artery sheath  was exchanged over the wire for a Perclose ProGlide which was utilized for access closure. Immediate hemostasis was achieved. IMPRESSION: 1. Prominent mucosal blush in the left nasal cavity with active contrast extravasation. 2. Successful and uncomplicated endovascular embolization of the bilateral sphenopalatine arteries with polyvinyl alcohol particles for treatment of epistaxis refractory to nasal packing. 3. Residual mucosal blush of the nasal septum via anterior ethmoidal branches of the left ophthalmic artery. PLAN: Patient will remain intubated due to aspiration risk and will be transferred to ICU for continued management. Electronically Signed   By: Pedro Earls M.D.   On: 07/18/2021 14:08   DG Chest Port 1 View  Result Date: 08/12/2021 CLINICAL DATA:  Short of breath. EXAM: PORTABLE CHEST 1 VIEW COMPARISON:  08/11/2021 FINDINGS: Heart size and vascularity normal. Mild bibasilar atelectasis with slight progression. No effusion. Upper lobes clear. IMPRESSION: Mild bibasilar atelectasis with progression. Electronically Signed   By: Franchot Gallo M.D.   On: 08/12/2021 10:50   DG Chest Port 1 View  Result Date: 08/06/2021 CLINICAL DATA:  Sepsis workup. EXAM: PORTABLE CHEST 1 VIEW COMPARISON:  Portable chest 07/27/2021 FINDINGS: The patient is rotated to the left and the lungs are expiratory with obscured lung bases. The visualized lungs are generally clear. No pleural effusion is seen. There is mild cardiomegaly, mild aortic atherosclerosis and tortuosity. There is prominence of the central vessels which could relate to low lung volumes or mild perihilar vascular congestion without evidence of acute edema. Osteopenia mild thoracic dextroscoliosis. There is moderate aeration of the flexures of the colon incidentally noted. IMPRESSION: Expiratory with obscured lung bases. The visualized hypoinflated lungs are generally clear. There could be mild perihilar vascular congestion or  exaggerated central vasculature due to expiration. Electronically Signed   By: Telford Nab M.D.   On: 08/06/2021 23:15   DG Chest Port 1 View  Result Date: 07/27/2021 CLINICAL DATA:  Shortness of breath. EXAM: PORTABLE CHEST 1 VIEW COMPARISON:  Chest x-ray dated July 22, 2021. FINDINGS: The patient is rotated to the left. The heart size and mediastinal contours are within normal limits. Normal pulmonary vascularity. No focal consolidation, pleural effusion, or pneumothorax. No acute osseous abnormality. IMPRESSION: No active disease. Electronically Signed   By: Titus Dubin M.D.   On: 07/27/2021 11:21   DG CHEST PORT 1 VIEW  Result Date: 07/22/2021 CLINICAL DATA:  Fever. EXAM: PORTABLE CHEST 1 VIEW COMPARISON:  July 19, 2021 FINDINGS: The ET and NG tubes have been removed. The heart, hila, mediastinum, lungs, and pleura are unremarkable. IMPRESSION: No active disease. Electronically Signed   By: Dorise Bullion III M.D.   On: 07/22/2021 19:06   DG Chest Port 1 View  Result Date: 07/19/2021 CLINICAL DATA:  Intubated, ventilated EXAM: PORTABLE CHEST 1 VIEW COMPARISON:  Chest radiograph dated 1 day prior FINDINGS: The endotracheal tube has been retracted and now projects approximately 4.0 cm from the carina. There is a new enteric catheter in place with the tip coursing off the field of view. The cardiomediastinal silhouette is normal. The lungs are clear, without focal consolidation or pulmonary edema. There is no pleural effusion or pneumothorax. There is no acute osseous abnormality. IMPRESSION: 1. Interval retraction of the endotracheal tube tip which is now in satisfactory position. 2. Clear lungs. Electronically Signed   By: Court Joy.D.  On: 07/19/2021 08:08   DG Chest Port 1 View  Result Date: 07/18/2021 CLINICAL DATA:  Endotracheal tube placement. EXAM: PORTABLE CHEST 1 VIEW COMPARISON:  07/14/2021 FINDINGS: No 633 hours. Low lung volumes. Cardiopericardial silhouette is  at upper limits of normal for size. The lungs are clear without focal pneumonia, edema, pneumothorax or pleural effusion. Endotracheal tube tip is just into the right mainstem bronchus and could be retracted 3 cm for more appropriate positioning. Bones are diffusely demineralized. Telemetry leads overlie the chest. IMPRESSION: 1. Endotracheal tube tip just into the right mainstem bronchus. Recommend retracting 3 cm for more appropriate positioning. 2. Low lung volumes without acute cardiopulmonary findings. I called these results to the patient's nurse, Loma Sousa, at approximately 0656 hours on 07/18/2021. Electronically Signed   By: Misty Stanley M.D.   On: 07/18/2021 06:57   DG Chest Port 1V same Day  Result Date: 08/13/2021 CLINICAL DATA:  Shortness of breath. EXAM: PORTABLE CHEST 1 VIEW COMPARISON:  August 12, 2021. FINDINGS: The heart size and mediastinal contours are within normal limits. Mild bibasilar atelectasis or infiltrates are again noted and stable. The visualized skeletal structures are unremarkable. IMPRESSION: Stable bibasilar atelectasis or infiltrates. Electronically Signed   By: Marijo Conception M.D.   On: 08/13/2021 05:27   DG Chest Port 1V same Day  Result Date: 08/11/2021 CLINICAL DATA:  Shortness of breath EXAM: PORTABLE CHEST 1 VIEW COMPARISON:  08/06/2021 FINDINGS: The heart size and mediastinal contours are within normal limits. Both lungs are clear. The visualized skeletal structures are unremarkable. IMPRESSION: No acute abnormality of the lungs in AP portable projection. Electronically Signed   By: Delanna Ahmadi M.D.   On: 08/11/2021 09:52   DG Abd Portable 1V  Result Date: 07/18/2021 CLINICAL DATA:  OG tube placement EXAM: PORTABLE ABDOMEN - 1 VIEW COMPARISON:  None. FINDINGS: Enteric tube tip is in the pyloric region. Included bowel gas pattern is unremarkable. IMPRESSION: Enteric tube in pyloric region. Electronically Signed   By: Macy Mis M.D.   On: 07/18/2021  10:59   ECHOCARDIOGRAM COMPLETE  Result Date: 08/07/2021    ECHOCARDIOGRAM REPORT   Patient Name:   DEVYN GRIFFING Date of Exam: 08/07/2021 Medical Rec #:  [id number]    Height:       67.0 in Accession #:    [id number]   Weight:       178.6 lb Date of Birth:  1946-05-23     BSA:          1.927 m Patient Age:    24 years     BP:           105/66 mmHg Patient Gender: F            HR:           102 bpm. Exam Location:  Inpatient Procedure: 2D Echo, Cardiac Doppler, Color Doppler and Intracardiac            Opacification Agent Indications:    Pulmonary Emboli  History:        Patient has prior history of Echocardiogram examinations, most                 recent 02/12/2020.  Sonographer:    Glo Herring Referring Phys: [id number] Rhetta Mura  Sonographer Comments: Technically difficult apical windows IMPRESSIONS  1. Left ventricular ejection fraction, by estimation, is 65 to 70%. The left ventricle has normal function. The left ventricle has no regional wall motion abnormalities. There  is mild asymmetric left ventricular hypertrophy of the basal-septal segment. Left ventricular diastolic parameters are indeterminate.  2. Right ventricular systolic function is mildly reduced. Free wall hypokinesis. The right ventricular size is mildly enlarged. There is mildly elevated pulmonary artery systolic pressure. The estimated right ventricular systolic pressure is 85.0 mmHg.  3. The mitral valve is normal in structure. No evidence of mitral valve regurgitation. No evidence of mitral stenosis.  4. Tricuspid valve regurgitation is mild to moderate.  5. The aortic valve is tricuspid. Aortic valve regurgitation is mild. Aortic valve sclerosis/calcification is present, without any evidence of aortic stenosis.  6. The inferior vena cava is normal in size with greater than 50% respiratory variability, suggesting right atrial pressure of 3 mmHg. FINDINGS  Left Ventricle: Left ventricular ejection fraction, by estimation, is 65 to 70%.  The left ventricle has normal function. The left ventricle has no regional wall motion abnormalities. Definity contrast agent was given IV to delineate the left ventricular  endocardial borders. The left ventricular internal cavity size was small. There is mild asymmetric left ventricular hypertrophy of the basal-septal segment. Left ventricular diastolic parameters are indeterminate. Right Ventricle: The right ventricular size is mildly enlarged. Right vetricular wall thickness was not well visualized. Right ventricular systolic function is mildly reduced. There is mildly elevated pulmonary artery systolic pressure. The tricuspid regurgitant velocity is 2.92 m/s, and with an assumed right atrial pressure of 3 mmHg, the estimated right ventricular systolic pressure is 27.7 mmHg. Left Atrium: Left atrial size was normal in size. Right Atrium: Right atrial size was normal in size. Pericardium: There is no evidence of pericardial effusion. Mitral Valve: The mitral valve is normal in structure. No evidence of mitral valve regurgitation. No evidence of mitral valve stenosis. Tricuspid Valve: The tricuspid valve is normal in structure. Tricuspid valve regurgitation is mild to moderate. Aortic Valve: The aortic valve is tricuspid. Aortic valve regurgitation is mild. Aortic valve sclerosis/calcification is present, without any evidence of aortic stenosis. Aortic valve mean gradient measures 4.0 mmHg. Aortic valve peak gradient measures 5.7 mmHg. Aortic valve area, by VTI measures 2.09 cm. Pulmonic Valve: The pulmonic valve was not well visualized. Pulmonic valve regurgitation is trivial. Aorta: The aortic root and ascending aorta are structurally normal, with no evidence of dilitation. Venous: The inferior vena cava is normal in size with greater than 50% respiratory variability, suggesting right atrial pressure of 3 mmHg. IAS/Shunts: The interatrial septum was not well visualized.  LEFT VENTRICLE PLAX 2D LVIDd:          3.60 cm     Diastology LVIDs:         2.00 cm     LV e' medial:    6.96 cm/s LV PW:         0.90 cm     LV E/e' medial:  10.0 LV IVS:        0.80 cm     LV e' lateral:   6.53 cm/s LVOT diam:     1.90 cm     LV E/e' lateral: 10.7 LV SV:         44 LV SV Index:   23 LVOT Area:     2.84 cm  LV Volumes (MOD) LV vol d, MOD A4C: 77.7 ml LV SV MOD A4C:     77.7 ml RIGHT VENTRICLE             IVC RV S prime:     14.00 cm/s  IVC diam: 1.40 cm  LEFT ATRIUM         Index LA diam:    2.90 cm 1.50 cm/m  AORTIC VALVE                    PULMONIC VALVE AV Area (Vmax):    2.16 cm     PV Vmax:       1.11 m/s AV Area (Vmean):   1.91 cm     PV Peak grad:  4.9 mmHg AV Area (VTI):     2.09 cm AV Vmax:           119.00 cm/s AV Vmean:          90.100 cm/s AV VTI:            0.209 m AV Peak Grad:      5.7 mmHg AV Mean Grad:      4.0 mmHg LVOT Vmax:         90.70 cm/s LVOT Vmean:        60.700 cm/s LVOT VTI:          0.154 m LVOT/AV VTI ratio: 0.74  AORTA Ao Root diam: 3.50 cm Ao Asc diam:  3.50 cm MITRAL VALVE               TRICUSPID VALVE MV Area (PHT): 3.91 cm    TR Peak grad:   34.1 mmHg MV Decel Time: 194 msec    TR Vmax:        292.00 cm/s MV E velocity: 69.80 cm/s MV A velocity: 90.00 cm/s  SHUNTS MV E/A ratio:  0.78        Systemic VTI:  0.15 m                            Systemic Diam: 1.90 cm Oswaldo Milian MD Electronically signed by Oswaldo Milian MD Signature Date/Time: 08/07/2021/1:07:04 PM    Final    VAS [place] LOWER EXTREMITY VENOUS (DVT)  Result Date: 08/07/2021  Lower Venous DVT Study Patient Name:  KENNIYAH SASAKI  Date of Exam:   08/07/2021 Medical Rec #: [id number]     Accession #:    [id number] Date of Birth: December 08, 1945      Patient Gender: F Patient Age:   64 years Exam Location:  [facility] Procedure:      VAS [place] LOWER EXTREMITY VENOUS (DVT) Referring Phys: Aline August --------------------------------------------------------------------------------  Indications: Pulmonary embolism.  Limitations:  Body habitus and poor ultrasound/tissue interface. Comparison Study: Previous exam on 02/12/2020 was negative for DVT. Performing Technologist: Rogelia Rohrer RVT, RDMS  Examination Guidelines: A complete evaluation includes B-mode imaging, spectral Doppler, color Doppler, and power Doppler as needed of all accessible portions of each vessel. Bilateral testing is considered an integral part of a complete examination. Limited examinations for reoccurring indications may be performed as noted. The reflux portion of the exam is performed with the patient in reverse Trendelenburg.  +---------+---------------+---------+-----------+----------+------------------+  RIGHT     Compressibility Phasicity Spontaneity Properties Thrombus Aging      +---------+---------------+---------+-----------+----------+------------------+  CFV       Full            Yes       Yes                                        +---------+---------------+---------+-----------+----------+------------------+  SFJ       Full                                                                 +---------+---------------+---------+-----------+----------+------------------+  FV Prox   Full            Yes       Yes                                        +---------+---------------+---------+-----------+----------+------------------+  FV Mid    Full            Yes       Yes                                        +---------+---------------+---------+-----------+----------+------------------+  FV Distal Full            Yes       Yes                                        +---------+---------------+---------+-----------+----------+------------------+  PFV       Full                                                                 +---------+---------------+---------+-----------+----------+------------------+  POP       Full            Yes       Yes                                        +---------+---------------+---------+-----------+----------+------------------+  PTV        Partial         No        No                     Acute - one of                                                                  paired              +---------+---------------+---------+-----------+----------+------------------+  PERO      Full                                                                 +---------+---------------+---------+-----------+----------+------------------+   +---------+---------------+---------+-----------+----------+-------------------+  LEFT      Compressibility Phasicity Spontaneity Properties Thrombus Aging       +---------+---------------+---------+-----------+----------+-------------------+  CFV       Full            Yes       Yes                                         +---------+---------------+---------+-----------+----------+-------------------+  SFJ       Full                                             Not well visualized  +---------+---------------+---------+-----------+----------+-------------------+  FV Prox   Full            Yes       Yes                                         +---------+---------------+---------+-----------+----------+-------------------+  FV Mid    Full            Yes       Yes                                         +---------+---------------+---------+-----------+----------+-------------------+  FV Distal Full            Yes       Yes                                         +---------+---------------+---------+-----------+----------+-------------------+  PFV       Full                                                                  +---------+---------------+---------+-----------+----------+-------------------+  POP       Full            Yes       Yes                                         +---------+---------------+---------+-----------+----------+-------------------+  PTV       Full                                                                  +---------+---------------+---------+-----------+----------+-------------------+  PERO      Full                                                                   +---------+---------------+---------+-----------+----------+-------------------+  Summary: BILATERAL: - No evidence of superficial venous thrombosis in the lower extremities, bilaterally. -No evidence of popliteal cyst, bilaterally. RIGHT: - Findings consistent with acute deep vein thrombosis involving the right posterior tibial veins.  LEFT: - There is no evidence of deep vein thrombosis in the lower extremity.  *See table(s) above for measurements and observations. Electronically signed by Monica Martinez MD on 08/07/2021 at 1:50:38 PM.    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 Presented to there ED at [facility] with generalized weakness, and difficulty ambulating. Patient developed acute bleeding of the left nare on 11.14.22 at 22:30. Rhinorocket placed for hemostasis was unsuccessful. 8 cm Merocel sponge and Afrin attempted by ENT also unsuccessful. ENT placed a Epistat catheter with 19 ml balloon. She comes to our service for a cerebral angiogram with endovascular epistaxis embolization. EXAM: ULTRASOUND-GUIDED VASCULAR ACCESS DIAGNOSTIC CEREBRAL ANGIOGRAM VASCULAR EMBOLIZATION OF BILATERAL SPHENOPALATINE ARTERIES COMPARISON:  None. MEDICATIONS: Ancef 2 gm IV. The antibiotic was administered within 1 hour of the procedure. ANESTHESIA/SEDATION: The procedure was performed under general anesthesia. CONTRAST:  170 mL of Omnipaque 300 milligram/mL FLUOROSCOPY TIME:  Fluoroscopy Time: 45 minutes 42 seconds (1979 mGy). COMPLICATIONS: None immediate. TECHNIQUE: Informed written consent was obtained from the patient and her husband after a thorough discussion of the procedural risks,  benefits and alternatives. All questions were addressed. Maximal Sterile Barrier Technique was utilized including caps, mask, sterile gowns, sterile gloves, sterile drape, hand hygiene and skin antiseptic. A timeout was performed prior to the initiation of the procedure. The right groin was prepped and draped in the usual sterile fashion. Using a micropuncture kit and the modified Seldinger technique, access was gained to the right common femoral artery and an 8 French sheath was placed. Real-time ultrasound guidance was utilized for vascular access including the acquisition of a permanent ultrasound image documenting patency of the accessed vessel. Under fluoroscopy, a 4 [place] Berenstein 2 catheter was navigated over a 0.035" Terumo Glidewire into the aortic arch. The catheter was placed into the left common carotid artery. Frontal and lateral angiograms of the neck were obtained. The catheter was then placed into the left internal carotid artery. Frontal and lateral angiograms of the head were obtained. Next, the catheter was placed into the left external carotid artery. Frontal and lateral angiograms of the neck and head/face were obtained. Left sphenopalatine artery embolization was then carried out as described below. Next, the Envoy catheter was retracted into the aortic arch and then navigated to the right common carotid artery. Frontal and lateral angiograms of the neck were obtained. The catheter was then placed into the right internal carotid artery. Frontal and lateral angiograms of the head were obtained. Attempts to navigate the Envoy catheter into the right external carotid artery proved unsuccessful due to branch takeoff angle. The Envoy catheter was then exchanged over the wire and under biplane roadmap for a 4 [place] Berenstein catheter. Next, the catheter was placed into the right external carotid artery. Frontal and lateral angiograms of the neck and head/face were obtained. The Berenstein 2  catheter was then exchanged over the wire and under biplane roadmap for a 6 [place] envoy which was placed into into the right external carotid artery. Right sphenopalatine artery embolization was then carried out as described  below. The catheter was subsequently withdrawn. A right common femoral artery angiogram was obtained in right anterior oblique view. FINDINGS: 1. Normal caliber of the right common femoral artery, adequate for vascular access and closure device utilization. 2. Prominent mucosal blush in the nasal cavity on the left with contrast pooling in the inferior and middle meatus. 3. Normal appearing right nasal cavity mucosal blush. 4. Normal appearance of the bilateral carotid bifurcation without hemodynamically significant stenosis. 5. Brisk contrast opacification of the bilateral MCA and ACA vascular tree without significant vascular abnormality. PROCEDURE: After left-sided diagnostic angiogram, the Berenstein 2 catheter was exchanged over the wire and biplane roadmap for a 6 French envoy DA which was placed into the left external carotid artery. Magnified frontal and lateral angiograms of the face were obtained. Using biplane roadmap, a Prowler select Plus microcatheter was navigated over a synchro 2 micro guidewire into the left sphenopalatine artery. Magnified frontal and lateral angiograms were obtained via microcatheter contrast injection. Subsequently, left sphenopalatine artery embolization was carried out with 250-350 microns PVA particles suspension with iodine contrast for opacification. Follow-up angiogram showed near complete embolization. The catheter was slightly pulled back more proximally and additional embolization with PVA particles was performed. The microcatheter was retracted under continuous aspiration. 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 The Envoy catheter was then placed into the left internal carotid artery. Frontal and  lateral angiograms of the head were obtained with no evidence of nontarget embolization. Mild nasal septum blush is noted via ethmoidal branches of the left ophthalmic artery. After right-sided diagnostic angiogram, a Prowler select Plus microcatheter was navigated over a synchro 2 micro guidewire into the right sphenopalatine artery under biplane roadmap guidance. Magnified frontal and lateral angiograms were obtained via microcatheter contrast injection. Subsequently, right sphenopalatine artery embolization was carried out with 250-350 microns PVA particles suspension with iodine contrast for opacification. 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 The Envoy catheter was then placed into the right internal carotid artery. Frontal and lateral angiograms of the head were obtained with no evidence of nontarget embolization. At the end of the procedure, the 8 French right common femoral artery sheath was exchanged over the wire for a Perclose ProGlide which was utilized for access closure. Immediate hemostasis was achieved. IMPRESSION: 1. Prominent mucosal blush in the left nasal cavity with active contrast extravasation. 2. Successful and uncomplicated endovascular embolization of the bilateral sphenopalatine arteries with polyvinyl alcohol particles for treatment of epistaxis refractory to nasal packing. 3. Residual mucosal blush of the nasal septum via anterior ethmoidal branches of the left ophthalmic artery. PLAN: Patient will remain intubated due to aspiration risk and will be transferred to ICU for continued management. Electronically Signed   By: Pedro Earls M.D.   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 Presented to there ED at [facility] with generalized weakness, and difficulty ambulating. Patient developed acute bleeding of the left nare on 11.14.22 at 22:30. Rhinorocket placed for hemostasis was unsuccessful. 8 cm Merocel sponge and Afrin attempted by ENT also unsuccessful. ENT placed a Epistat catheter with 19 ml balloon. She comes to our service for a cerebral angiogram with endovascular epistaxis embolization. EXAM: ULTRASOUND-GUIDED VASCULAR ACCESS DIAGNOSTIC CEREBRAL  ANGIOGRAM VASCULAR EMBOLIZATION OF BILATERAL SPHENOPALATINE ARTERIES COMPARISON:  None. MEDICATIONS: Ancef 2 gm IV. The antibiotic was administered within 1 hour of the procedure. ANESTHESIA/SEDATION: The procedure was performed under general anesthesia. CONTRAST:  170 mL of Omnipaque 300 milligram/mL FLUOROSCOPY TIME:  Fluoroscopy Time: 45 minutes 42 seconds (1979 mGy). COMPLICATIONS: None immediate. TECHNIQUE: Informed written consent was obtained from the patient and her husband after a thorough discussion of the procedural risks, benefits and alternatives. All questions were addressed. Maximal Sterile Barrier Technique was utilized including caps, mask, sterile gowns, sterile gloves, sterile drape, hand hygiene and skin antiseptic. A timeout was performed prior to the initiation of the procedure. The right groin was prepped and draped in the usual sterile fashion. Using a micropuncture kit and the modified Seldinger technique, access was gained to the right common femoral artery and an 8 French sheath was placed. Real-time ultrasound guidance was utilized for vascular access including the acquisition of a permanent ultrasound image documenting patency of the accessed vessel. Under fluoroscopy, a 4 [place] Berenstein 2 catheter was navigated over a 0.035" Terumo Glidewire into the aortic arch. The catheter was placed into the left common carotid artery. Frontal and lateral angiograms of the neck were  obtained. The catheter was then placed into the left internal carotid artery. Frontal and lateral angiograms of the head were obtained. Next, the catheter was placed into the left external carotid artery. Frontal and lateral angiograms of the neck and head/face were obtained. Left sphenopalatine artery embolization was then carried out as described below. Next, the Envoy catheter was retracted into the aortic arch and then navigated to the right common carotid artery. Frontal and lateral angiograms of the neck were obtained. The catheter was then placed into the right internal carotid artery. Frontal and lateral angiograms of the head were obtained. Attempts to navigate the Envoy catheter into the right external carotid artery proved unsuccessful due to branch takeoff angle. The Envoy catheter was then exchanged over the wire and under biplane roadmap for a 4 [place] Berenstein catheter. Next, the catheter was placed into the right external carotid artery. Frontal and lateral angiograms of the neck and head/face were obtained. The Berenstein 2 catheter was then exchanged over the wire and under biplane roadmap for a 6 [place] envoy which was placed into into the right external carotid artery. Right sphenopalatine artery embolization was then carried out as described below. The catheter was subsequently withdrawn. A right common femoral artery angiogram was obtained in right anterior oblique view. FINDINGS: 1. Normal caliber of the right common femoral artery, adequate for vascular access and closure device utilization. 2. Prominent mucosal blush in the nasal cavity on the left with contrast pooling in the inferior and middle meatus. 3. Normal appearing right nasal cavity mucosal blush. 4. Normal appearance of the bilateral carotid bifurcation without hemodynamically significant stenosis. 5. Brisk contrast opacification of the bilateral MCA and ACA vascular tree without significant vascular abnormality. PROCEDURE:  After left-sided diagnostic angiogram, the Berenstein 2 catheter was exchanged over the wire and biplane roadmap for a 6 [place] envoy DA which was placed into the left external carotid artery. Magnified frontal and lateral angiograms of the face were obtained. Using biplane roadmap, a Prowler select Plus microcatheter was navigated over a synchro 2 micro guidewire into the left sphenopalatine artery. Magnified frontal and lateral angiograms were obtained via microcatheter contrast injection. Subsequently, left sphenopalatine artery embolization was carried out with 250-350 microns PVA particles suspension with iodine contrast for opacification. Follow-up angiogram  showed near complete embolization. The catheter was slightly pulled back more proximally and additional embolization with PVA particles was performed. The microcatheter was retracted under continuous aspiration. 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 The Envoy catheter was then placed into the left internal carotid artery. Frontal and lateral angiograms of the head were obtained with no evidence of nontarget embolization. Mild nasal septum blush is noted via ethmoidal branches of the left ophthalmic artery. After right-sided diagnostic angiogram, a Prowler select Plus microcatheter was navigated over a synchro 2 micro guidewire into the right sphenopalatine artery under biplane roadmap guidance. Magnified frontal and lateral angiograms were obtained via microcatheter contrast injection. Subsequently, right sphenopalatine artery embolization was carried out with 250-350 microns PVA particles suspension with iodine contrast for opacification. 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 The Envoy catheter was then placed into the right internal carotid artery. Frontal and lateral angiograms of the head were obtained with no evidence of nontarget embolization.  At the end of the procedure, the 8 French right common femoral artery sheath was exchanged over the wire for a Perclose ProGlide which was utilized for access closure. Immediate hemostasis was achieved. IMPRESSION: 1. Prominent mucosal blush in the left nasal cavity with active contrast extravasation. 2. Successful and uncomplicated endovascular embolization of the bilateral sphenopalatine arteries with polyvinyl alcohol particles for treatment of epistaxis refractory to nasal packing. 3. Residual mucosal blush of the nasal septum via anterior ethmoidal branches of the left ophthalmic artery. PLAN: Patient will remain intubated due to aspiration risk and will be transferred to ICU for continued management. Electronically Signed   By: Pedro Earls M.D.   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 Presented to there ED at [facility] with generalized weakness, and difficulty ambulating. Patient developed acute bleeding of the left nare on 11.14.22 at 22:30. Rhinorocket placed for hemostasis was unsuccessful. 8 cm Merocel sponge and Afrin attempted by ENT also unsuccessful. ENT placed a Epistat catheter with 19 ml balloon. She comes to our service for a cerebral angiogram with endovascular epistaxis embolization. EXAM: ULTRASOUND-GUIDED VASCULAR ACCESS DIAGNOSTIC CEREBRAL ANGIOGRAM VASCULAR EMBOLIZATION OF BILATERAL SPHENOPALATINE ARTERIES COMPARISON:  None. MEDICATIONS: Ancef 2 gm IV. The antibiotic was administered within 1 hour of the procedure. ANESTHESIA/SEDATION: The procedure was performed under general anesthesia. CONTRAST:  170 mL of Omnipaque 300 milligram/mL FLUOROSCOPY TIME:  Fluoroscopy Time: 45 minutes 42 seconds (1979 mGy).  COMPLICATIONS: None immediate. TECHNIQUE: Informed written consent was obtained from the patient and her husband after a thorough discussion of the procedural risks, benefits and alternatives. All questions were addressed. Maximal Sterile Barrier Technique was utilized including caps, mask, sterile gowns, sterile gloves, sterile drape, hand hygiene and skin antiseptic. A timeout was performed prior to the initiation of the procedure. The right groin was prepped and draped in the usual sterile fashion. Using a micropuncture kit and the modified Seldinger technique, access was gained to the right common femoral artery and an 8 French sheath was placed. Real-time ultrasound guidance was utilized for vascular access including the acquisition of a permanent ultrasound image documenting patency of the accessed vessel. Under fluoroscopy, a 4 [place] Berenstein 2 catheter was navigated over a 0.035" Terumo Glidewire into the  aortic arch. The catheter was placed into the left common carotid artery. Frontal and lateral angiograms of the neck were obtained. The catheter was then placed into the left internal carotid artery. Frontal and lateral angiograms of the head were obtained. Next, the catheter was placed into the left external carotid artery. Frontal and lateral angiograms of the neck and head/face were obtained. Left sphenopalatine artery embolization was then carried out as described below. Next, the Envoy catheter was retracted into the aortic arch and then navigated to the right common carotid artery. Frontal and lateral angiograms of the neck were obtained. The catheter was then placed into the right internal carotid artery. Frontal and lateral angiograms of the head were obtained. Attempts to navigate the Envoy catheter into the right external carotid artery proved unsuccessful due to branch takeoff angle. The Envoy catheter was then exchanged over the wire and under biplane roadmap for a 4 [place] Berenstein  catheter. Next, the catheter was placed into the right external carotid artery. Frontal and lateral angiograms of the neck and head/face were obtained. The Berenstein 2 catheter was then exchanged over the wire and under biplane roadmap for a 6 [place] envoy which was placed into into the right external carotid artery. Right sphenopalatine artery embolization was then carried out as described below. The catheter was subsequently withdrawn. A right common femoral artery angiogram was obtained in right anterior oblique view. FINDINGS: 1. Normal caliber of the right common femoral artery, adequate for vascular access and closure device utilization. 2. Prominent mucosal blush in the nasal cavity on the left with contrast pooling in the inferior and middle meatus. 3. Normal appearing right nasal cavity mucosal blush. 4. Normal appearance of the bilateral carotid bifurcation without hemodynamically significant stenosis. 5. Brisk contrast opacification of the bilateral MCA and ACA vascular tree without significant vascular abnormality. PROCEDURE: After left-sided diagnostic angiogram, the Berenstein 2 catheter was exchanged over the wire and biplane roadmap for a 6 French envoy DA which was placed into the left external carotid artery. Magnified frontal and lateral angiograms of the face were obtained. Using biplane roadmap, a Prowler select Plus microcatheter was navigated over a synchro 2 micro guidewire into the left sphenopalatine artery. Magnified frontal and lateral angiograms were obtained via microcatheter contrast injection. Subsequently, left sphenopalatine artery embolization was carried out with 250-350 microns PVA particles suspension with iodine contrast for opacification. Follow-up angiogram showed near complete embolization. The catheter was slightly pulled back more proximally and additional embolization with PVA particles was performed. The microcatheter was retracted under continuous aspiration. 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 The Envoy catheter was then placed into the left internal carotid artery. Frontal and lateral angiograms of the head were obtained with no evidence of nontarget embolization. Mild nasal septum blush is noted via ethmoidal branches of the left ophthalmic artery. After right-sided diagnostic angiogram, a Prowler select Plus microcatheter was navigated over a synchro 2 micro guidewire into the right sphenopalatine artery under biplane roadmap guidance. Magnified frontal and lateral angiograms were obtained via microcatheter contrast injection. Subsequently, right sphenopalatine artery embolization was carried out with 250-350 microns PVA particles suspension with iodine contrast for opacification. 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 The Envoy catheter was then placed into the right internal carotid artery. Frontal and lateral angiograms of the head were obtained with no evidence of nontarget embolization. At the end of the procedure, the 8 French right common femoral artery sheath  was exchanged over the wire for a Perclose ProGlide which was utilized for access closure. Immediate hemostasis was achieved. IMPRESSION: 1. Prominent mucosal blush in the left nasal cavity with active contrast extravasation. 2. Successful and uncomplicated endovascular embolization of the bilateral sphenopalatine arteries with polyvinyl alcohol particles for treatment of epistaxis refractory to nasal packing. 3. Residual mucosal blush of the nasal septum via anterior ethmoidal branches of the left ophthalmic artery. PLAN: Patient will remain intubated due to aspiration risk and will be transferred to ICU for continued management. Electronically Signed   By: Pedro Earls M.D.   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 Presented to there ED at [facility] with generalized weakness, and difficulty ambulating. Patient developed acute bleeding of the left nare on 11.14.22 at 22:30. Rhinorocket placed for hemostasis was unsuccessful. 8 cm Merocel sponge and Afrin attempted by ENT also unsuccessful. ENT placed a Epistat catheter with 19 ml balloon. She comes to our service for a cerebral angiogram with endovascular epistaxis embolization. EXAM: ULTRASOUND-GUIDED VASCULAR ACCESS DIAGNOSTIC CEREBRAL ANGIOGRAM VASCULAR EMBOLIZATION OF BILATERAL SPHENOPALATINE ARTERIES COMPARISON:  None. MEDICATIONS: Ancef 2 gm IV. The antibiotic was administered within 1 hour of the procedure. ANESTHESIA/SEDATION: The procedure was performed under general anesthesia. CONTRAST:  170 mL of Omnipaque 300 milligram/mL FLUOROSCOPY TIME:  Fluoroscopy Time: 45 minutes 42 seconds (1979 mGy). COMPLICATIONS: None immediate. TECHNIQUE: Informed written consent was obtained from the patient and her husband after a thorough discussion of the procedural risks, benefits and alternatives. All questions were addressed. Maximal Sterile Barrier Technique was utilized including caps, mask, sterile gowns, sterile gloves, sterile drape, hand hygiene and skin antiseptic. A timeout was performed prior to the initiation of the procedure. The right groin was prepped and draped in the usual sterile fashion. Using a micropuncture kit and the modified Seldinger technique, access was gained to the right common femoral artery and an 8 French sheath was placed. Real-time ultrasound guidance was utilized for vascular access including the acquisition of a permanent ultrasound image documenting patency of the accessed vessel. Under fluoroscopy, a 4 [place] Berenstein 2 catheter was navigated over a  0.035" Terumo Glidewire into the aortic arch. The catheter was placed into the left common carotid artery. Frontal and lateral angiograms of the neck were obtained. The catheter was then placed into the left internal carotid artery. Frontal and lateral angiograms of the head were obtained. Next, the catheter was placed into the left external carotid artery. Frontal and lateral angiograms of the neck and head/face were obtained. Left sphenopalatine artery embolization was then carried out as described below. Next, the Envoy catheter was retracted into the aortic arch and then navigated to the right common carotid artery. Frontal and lateral angiograms of the neck were obtained. The catheter was then placed into the right internal carotid artery. Frontal and lateral angiograms of the head were obtained. Attempts to navigate the Envoy catheter into the right external carotid artery proved unsuccessful due to branch takeoff angle. The Envoy catheter was then exchanged over the wire and under biplane roadmap for a 4 [place] Berenstein catheter. Next, the catheter was placed into the right external carotid artery. Frontal and lateral angiograms of the neck and head/face were obtained. The Berenstein 2 catheter was then exchanged  over the wire and under biplane roadmap for a 6 [place] envoy which was placed into into the right external carotid artery. Right sphenopalatine artery embolization was then carried out as described below. The catheter was subsequently withdrawn. A right common femoral artery angiogram was obtained in right anterior oblique view. FINDINGS: 1. Normal caliber of the right common femoral artery, adequate for vascular access and closure device utilization. 2. Prominent mucosal blush in the nasal cavity on the left with contrast pooling in the inferior and middle meatus. 3. Normal appearing right nasal cavity mucosal blush. 4. Normal appearance of the bilateral carotid bifurcation without hemodynamically  significant stenosis. 5. Brisk contrast opacification of the bilateral MCA and ACA vascular tree without significant vascular abnormality. PROCEDURE: After left-sided diagnostic angiogram, the Berenstein 2 catheter was exchanged over the wire and biplane roadmap for a 6 French envoy DA which was placed into the left external carotid artery. Magnified frontal and lateral angiograms of the face were obtained. Using biplane roadmap, a Prowler select Plus microcatheter was navigated over a synchro 2 micro guidewire into the left sphenopalatine artery. Magnified frontal and lateral angiograms were obtained via microcatheter contrast injection. Subsequently, left sphenopalatine artery embolization was carried out with 250-350 microns PVA particles suspension with iodine contrast for opacification. Follow-up angiogram showed near complete embolization. The catheter was slightly pulled back more proximally and additional embolization with PVA particles was performed. The microcatheter was retracted under continuous aspiration. 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 The Envoy catheter was then placed into the left internal carotid artery. Frontal and lateral angiograms of the head were obtained with no evidence of nontarget embolization. Mild nasal septum blush is noted via ethmoidal branches of the left ophthalmic artery. After right-sided diagnostic angiogram, a Prowler select Plus microcatheter was navigated over a synchro 2 micro guidewire into the right sphenopalatine artery under biplane roadmap guidance. Magnified frontal and lateral angiograms were obtained via microcatheter contrast injection. Subsequently, right sphenopalatine artery embolization was carried out with 250-350 microns PVA particles suspension with iodine contrast for opacification. 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 The Envoy catheter was  then placed into the right internal carotid artery. Frontal and lateral angiograms of the head were obtained with no evidence of nontarget embolization. At the end of the procedure, the 8 French right common femoral artery sheath was exchanged over the wire for a Perclose ProGlide which was utilized for access closure. Immediate hemostasis was achieved. IMPRESSION: 1. Prominent mucosal blush in the left nasal cavity with active contrast extravasation. 2. Successful and uncomplicated endovascular embolization of the bilateral sphenopalatine arteries with polyvinyl alcohol particles for treatment of epistaxis refractory to nasal packing. 3. Residual mucosal blush of the nasal septum via anterior ethmoidal branches of the left ophthalmic artery. PLAN: Patient will remain intubated due to aspiration risk and will be transferred to ICU for continued management. Electronically Signed   By: Pedro Earls M.D.   On: 07/18/2021 14:08     TODAY-DAY OF DISCHARGE:  Subjective:   Remo Lipps Keckler today has no headache,no chest abdominal pain,no new weakness tingling or numbness, feels much better wants to go home today.   Objective:   Blood pressure 105/77, pulse (!) 101, temperature 97.8 F (36.6 C), temperature source Oral, resp. rate 18, height '5\' 7"'  (1.702 m), weight 79.2 kg, SpO2 95 %.  Intake/Output Summary (Last 24 hours) at 08/15/2021 0944 Last data filed at 08/15/2021 1093 Gross  per 24 hour  Intake 0 ml  Output 2900 ml  Net -2900 ml   Filed Weights   08/10/21 0339 08/11/21 0500 08/15/21 0400  Weight: 78 kg 76.1 kg 79.2 kg    Exam: Awake Alert, Oriented *3, No new F.N deficits, Normal affect [place].AT,PERRAL Supple Neck,No JVD, No cervical lymphadenopathy appriciated.  Symmetrical Chest wall movement, Good air movement bilaterally, CTAB RRR,No Gallops,Rubs or new Murmurs, No Parasternal Heave +ve B.Sounds, Abd Soft, Non tender, No organomegaly appriciated, No rebound -guarding or  rigidity. No Cyanosis, Clubbing or edema, No new Rash or bruise   PERTINENT RADIOLOGIC STUDIES: No results found.   PERTINENT LAB RESULTS: CBC: Recent Labs    08/14/21 0034 08/15/21 0046  WBC 13.0* 10.5  HGB 10.6* 9.3*  HCT 34.7* 30.7*  PLT 227 223   CMET CMP     Component Value Date/Time   NA 136 08/14/2021 0034   NA 137 03/02/2019 1612   K 4.5 08/14/2021 0034   CL 102 08/14/2021 0034   CO2 24 08/14/2021 0034   GLUCOSE 116 (H) 08/14/2021 0034   BUN 15 08/14/2021 0034   BUN 8 03/02/2019 1612   CREATININE 0.56 08/14/2021 0034   CREATININE 0.80 01/08/2017 1449   CALCIUM 8.8 (L) 08/14/2021 0034   PROT 5.7 (L) 08/09/2021 0156   PROT 6.8 03/02/2019 1612   ALBUMIN 2.8 (L) 08/09/2021 0156   ALBUMIN 4.2 03/02/2019 1612   AST 23 08/09/2021 0156   ALT 25 08/09/2021 0156   ALKPHOS 85 08/09/2021 0156   BILITOT 0.5 08/09/2021 0156   BILITOT 0.2 03/02/2019 1612   GFRNONAA >60 08/14/2021 0034   GFRAA >60 02/17/2020 0453    GFR Estimated Creatinine Clearance: 67.8 mL/min (by C-G formula based on SCr of 0.56 mg/dL). No results for input(s): LIPASE, AMYLASE in the last 72 hours. No results for input(s): CKTOTAL, CKMB, CKMBINDEX, TROPONINI in the last 72 hours. Invalid input(s): POCBNP No results for input(s): DDIMER in the last 72 hours. No results for input(s): HGBA1C in the last 72 hours. No results for input(s): CHOL, HDL, LDLCALC, TRIG, CHOLHDL, LDLDIRECT in the last 72 hours. No results for input(s): TSH, T4TOTAL, T3FREE, THYROIDAB in the last 72 hours.  Invalid input(s): FREET3 No results for input(s): VITAMINB12, FOLATE, FERRITIN, TIBC, IRON, RETICCTPCT in the last 72 hours. Coags: No results for input(s): INR in the last 72 hours.  Invalid input(s): PT Microbiology: Recent Results (from the past 240 hour(s))  Resp Panel by RT-PCR (Flu A&B, Covid) Nasopharyngeal Swab     Status: None   Collection Time: 08/06/21 10:25 PM   Specimen: Nasopharyngeal Swab;  Nasopharyngeal(NP) swabs in vial transport medium  Result Value Ref Range Status   SARS Coronavirus 2 by RT PCR NEGATIVE NEGATIVE Final    Comment: (NOTE) SARS-CoV-2 target nucleic acids are NOT DETECTED.  The SARS-CoV-2 RNA is generally detectable in upper respiratory specimens during the acute phase of infection. The lowest concentration of SARS-CoV-2 viral copies this assay can detect is 138 copies/mL. A negative result does not preclude SARS-Cov-2 infection and should not be used as the sole basis for treatment or other patient management decisions. A negative result may occur with  improper specimen collection/handling, submission of specimen other than nasopharyngeal swab, presence of viral mutation(s) within the areas targeted by this assay, and inadequate number of viral copies(<138 copies/mL). A negative result must be combined with clinical observations, patient history, and epidemiological information. The expected result is Negative.  Fact Sheet for Patients:  EntrepreneurPulse.com.au  Fact Sheet for Healthcare Providers:  IncredibleEmployment.be  This test is no t yet approved or cleared by the [place] FDA and  has been authorized for detection and/or diagnosis of SARS-CoV-2 by FDA under an Emergency Use Authorization (EUA). This EUA will remain  in effect (meaning this test can be used) for the duration of the COVID-19 declaration under Section 564(b)(1) of the Act, 21 U.S.C.section 360bbb-3(b)(1), unless the authorization is terminated  or revoked sooner.       Influenza A by PCR NEGATIVE NEGATIVE Final   Influenza B by PCR NEGATIVE NEGATIVE Final    Comment: (NOTE) The Xpert Xpress SARS-CoV-2/FLU/RSV plus assay is intended as an aid in the diagnosis of influenza from Nasopharyngeal swab specimens and should not be used as a sole basis for treatment. Nasal washings and aspirates are unacceptable for Xpert Xpress  SARS-CoV-2/FLU/RSV testing.  Fact Sheet for Patients: EntrepreneurPulse.com.au  Fact Sheet for Healthcare Providers: IncredibleEmployment.be  This test is not yet approved or cleared by the [place] FDA and has been authorized for detection and/or diagnosis of SARS-CoV-2 by FDA under an Emergency Use Authorization (EUA). This EUA will remain in effect (meaning this test can be used) for the duration of the COVID-19 declaration under Section 564(b)(1) of the Act, 21 U.S.C. section 360bbb-3(b)(1), unless the authorization is terminated or revoked.  Performed at [facility], [place] [address] [place], [place] [address]   Blood Culture (routine x 2)     Status: None   Collection Time: 08/06/21 10:37 PM   Specimen: BLOOD LEFT HAND  Result Value Ref Range Status   Specimen Description BLOOD LEFT HAND  Final   Special Requests   Final    BOTTLES DRAWN AEROBIC AND ANAEROBIC Blood Culture results may not be optimal due to an inadequate volume of blood received in culture bottles   Culture   Final    NO GROWTH 5 DAYS Performed at [facility], [place] [address] [place], [place] [address]    Report Status 08/11/2021 FINAL  Final  Urine Culture     Status: Abnormal   Collection Time: 08/06/21 10:41 PM   Specimen: In/Out Cath Urine  Result Value Ref Range Status   Specimen Description IN/OUT CATH URINE  Final   Special Requests   Final    NONE Performed at [facility], [place] [address], [place], [place] [address]    Culture 70,000 COLONIES/mL KLEBSIELLA PNEUMONIAE (A)  Final   Report Status 08/09/2021 FINAL  Final   Organism ID, Bacteria KLEBSIELLA PNEUMONIAE (A)  Final      Susceptibility   Klebsiella pneumoniae - MIC*    AMPICILLIN >=32 RESISTANT Resistant     CEFAZOLIN <=4 SENSITIVE Sensitive     CEFEPIME <=0.12 SENSITIVE Sensitive     CEFTRIAXONE <=0.25 SENSITIVE Sensitive     CIPROFLOXACIN <=0.25 SENSITIVE Sensitive      GENTAMICIN <=1 SENSITIVE Sensitive     IMIPENEM <=0.25 SENSITIVE Sensitive     NITROFURANTOIN 32 SENSITIVE Sensitive     TRIMETH/SULFA <=20 SENSITIVE Sensitive     AMPICILLIN/SULBACTAM 4 SENSITIVE Sensitive     PIP/TAZO <=4 SENSITIVE Sensitive     * 70,000 COLONIES/mL KLEBSIELLA PNEUMONIAE  Blood Culture (routine x 2)     Status: None   Collection Time: 08/06/21 11:00 PM   Specimen: BLOOD RIGHT ARM  Result Value Ref Range Status   Specimen Description BLOOD RIGHT ARM  Final   Special Requests  Final    BOTTLES DRAWN AEROBIC AND ANAEROBIC Blood Culture adequate volume   Culture   Final    NO GROWTH 5 DAYS Performed at [facility], [place] [address] [place], [place] [address]    Report Status 08/11/2021 FINAL  Final  Culture, blood (routine x 2)     Status: None (Preliminary result)   Collection Time: 08/12/21  3:50 PM   Specimen: BLOOD LEFT HAND  Result Value Ref Range Status   Specimen Description BLOOD LEFT HAND  Final   Special Requests   Final    BOTTLES DRAWN AEROBIC AND ANAEROBIC Blood Culture adequate volume   Culture   Final    NO GROWTH 3 DAYS Performed at [facility], [place] [address], [place], [place] [address]    Report Status PENDING  Incomplete  Culture, blood (routine x 2)     Status: None (Preliminary result)   Collection Time: 08/12/21  3:50 PM   Specimen: BLOOD LEFT WRIST  Result Value Ref Range Status   Specimen Description BLOOD LEFT WRIST  Final   Special Requests   Final    BOTTLES DRAWN AEROBIC AND ANAEROBIC Blood Culture adequate volume   Culture   Final    NO GROWTH 3 DAYS Performed at [facility], [place] [address] [place], [place] [address]    Report Status PENDING  Incomplete  Resp Panel by RT-PCR (Flu A&B, Covid) Nasopharyngeal Swab     Status: None   Collection Time: 08/14/21 12:50 PM   Specimen: Nasopharyngeal Swab; Nasopharyngeal(NP) swabs in vial transport medium  Result Value Ref Range Status   SARS Coronavirus 2 by RT  PCR NEGATIVE NEGATIVE Final    Comment: (NOTE) SARS-CoV-2 target nucleic acids are NOT DETECTED.  The SARS-CoV-2 RNA is generally detectable in upper respiratory specimens during the acute phase of infection. The lowest concentration of SARS-CoV-2 viral copies this assay can detect is 138 copies/mL. A negative result does not preclude SARS-Cov-2 infection and should not be used as the sole basis for treatment or other patient management decisions. A negative result may occur with  improper specimen collection/handling, submission of specimen other than nasopharyngeal swab, presence of viral mutation(s) within the areas targeted by this assay, and inadequate number of viral copies(<138 copies/mL). A negative result must be combined with clinical observations, patient history, and epidemiological information. The expected result is Negative.  Fact Sheet for Patients:  EntrepreneurPulse.com.au  Fact Sheet for Healthcare Providers:  IncredibleEmployment.be  This test is no t yet approved or cleared by the [place] FDA and  has been authorized for detection and/or diagnosis of SARS-CoV-2 by FDA under an Emergency Use Authorization (EUA). This EUA will remain  in effect (meaning this test can be used) for the duration of the COVID-19 declaration under Section 564(b)(1) of the Act, 21 U.S.C.section 360bbb-3(b)(1), unless the authorization is terminated  or revoked sooner.       Influenza A by PCR NEGATIVE NEGATIVE Final   Influenza B by PCR NEGATIVE NEGATIVE Final    Comment: (NOTE) The Xpert Xpress SARS-CoV-2/FLU/RSV plus assay is intended as an aid in the diagnosis of influenza from Nasopharyngeal swab specimens and should not be used as a sole basis for treatment. Nasal washings and aspirates are unacceptable for Xpert Xpress SARS-CoV-2/FLU/RSV testing.  Fact Sheet for Patients: EntrepreneurPulse.com.au  Fact Sheet for  Healthcare Providers: IncredibleEmployment.be  This test is not yet approved or cleared by the [place] FDA and has been  authorized for detection and/or diagnosis of SARS-CoV-2 by FDA under an Emergency Use Authorization (EUA). This EUA will remain in effect (meaning this test can be used) for the duration of the COVID-19 declaration under Section 564(b)(1) of the Act, 21 U.S.C. section 360bbb-3(b)(1), unless the authorization is terminated or revoked.  Performed at [facility], [place] [address], [place], [place] [address]     FURTHER DISCHARGE INSTRUCTIONS:  Get Medicines reviewed and adjusted: Please take all your medications with you for your next visit with your Primary MD  Laboratory/radiological data: Please request your Primary MD to go over all hospital tests and procedure/radiological results at the follow up, please ask your Primary MD to get all Hospital records sent to his/her office.  In some cases, they will be blood work, cultures and biopsy results pending at the time of your discharge. Please request that your primary care M.D. goes through all the records of your hospital data and follows up on these results.  Also Note the following: If you experience worsening of your admission symptoms, develop shortness of breath, life threatening emergency, suicidal or homicidal thoughts you must seek medical attention immediately by calling 911 or calling your MD immediately  if symptoms less severe.  You must read complete instructions/literature along with all the possible adverse reactions/side effects for all the Medicines you take and that have been prescribed to you. Take any new Medicines after you have completely understood and accpet all the possible adverse reactions/side effects.   Do not drive when taking Pain medications or sleeping medications (Benzodaizepines)  Do not take more than prescribed Pain, Sleep and Anxiety Medications. It  is not advisable to combine anxiety,sleep and pain medications without talking with your primary care practitioner  Special Instructions: If you have smoked or chewed Tobacco  in the last 2 yrs please stop smoking, stop any regular Alcohol  and or any Recreational drug use.  Wear Seat belts while driving.  Please note: You were cared for by a hospitalist during your hospital stay. Once you are discharged, your primary care physician will handle any further medical issues. Please note that NO REFILLS for any discharge medications will be authorized once you are discharged, as it is imperative that you return to your primary care physician (or establish a relationship with a primary care physician if you do not have one) for your post hospital discharge needs so that they can reassess your need for medications and monitor your lab values.  Total Time spent coordinating discharge including counseling, education and face to face time equals 35 minutes.  SignedOren Binet 08/15/2021 9:44 AM

## 2021-08-15 NOTE — TOC Transition Note (Signed)
Transition of Care [facility]) - CM/SW Discharge Note   Patient Details  Name: Katelyn Lamb MRN: [id number] Date of Birth: 05-12-1946  [facility] CM/SW Contact:  Benard Halsted, LCSW Phone Number: 08/15/2021, 1:22 PM   Clinical Narrative:    Patient will DC to: [facility] Anticipated DC date: 08/15/21 Family notified: Spouse Transport by: Domenica Reamer   Per MD patient ready for DC to [facility]. RN to call report prior to discharge [phone], room 101). RN, patient, patient's family, and facility notified of DC. Discharge Summary and FL2 sent to facility. DC packet on chart. Ambulance transport requested for patient.   CSW will sign off for now as social work intervention is no longer needed. Please consult [place] again if new needs arise.     Final next level of care: Skilled Nursing Facility Barriers to Discharge: Barriers Resolved   Patient Goals and CMS Choice Patient states their goals for this hospitalization and ongoing recovery are:: to get rehab CMS Medicare.gov Compare Post Acute Care list provided to:: Patient Represenative (must comment) Choice offered to / list presented to : Spouse  Discharge Placement   Existing PASRR number confirmed : 08/15/21          Patient [facility] bed at: [facility] Patient to be transferred to facility by: [place] Name of family member notified: Spouse Patient and family notified of of transfer: 08/15/21  Discharge Plan and Services     Post Acute Care Choice: [place]                               Social Determinants of Health (SDOH) Interventions     Readmission Risk Interventions No flowsheet data found.

## 2021-08-15 NOTE — Progress Notes (Signed)
Writer attempted to call report to [facility] [phone]) x [phone] with no answer. Will continue to reach out to facility until PTAR arrives.

## 2021-08-15 NOTE — Progress Notes (Signed)
Pharmacy Antibiotic Note- Follow-up  Katelyn Lamb is a 75 y.o. female admitted on 08/06/2021 with pneumonia.  Pharmacy was been consulted for cefepime and vancomycin dosing. Vancomycin was discontinued on 08/13/2021   Plan:  discontinued 08/13/2021 Cefepime 2g IV every 8 hours Monitor renal fx, cx results, clinical pic  Height: 5\' 7"  (170.2 cm) Weight: 79.2 kg (174 lb 9.7 oz) IBW/kg (Calculated) : 61.6  Temp (24hrs), Avg:97.8 F (36.6 C), Min:97.3 F (36.3 C), Max:98.3 F (36.8 C)  Recent Labs  Lab 08/09/21 0156 08/10/21 0030 08/12/21 1111 08/12/21 1550 08/13/21 0036 08/14/21 0034 08/15/21 0046  WBC 12.1*   < > 14.1* 14.7* 12.4* 13.0* 10.5  CREATININE 0.60  --  0.76 0.80 0.64 0.56  --    < > = values in this interval not displayed.     Estimated Creatinine Clearance: 67.8 mL/min (by C-G formula based on SCr of 0.56 mg/dL).    Allergies  Allergen Reactions   Demerol [Meperidine] Other (See Comments)    Hallucinations   Percocet [Oxycodone-Acetaminophen] Itching   Amoxicillin-Pot Clavulanate Diarrhea    Severe pain, headache, intestinal infection   Penicillins Itching and Rash    Tolerated amoxicillin November 2022  Has patient had a PCN reaction causing immediate rash, facial/tongue/throat swelling, SOB or lightheadedness with hypotension:  NO Has patient had a PCN reaction causing severe rash involving mucus membranes or skin necrosis: No Has patient had a PCN reaction that required hospitalization: No Has patient had a PCN reaction occurring within the last 10 years: Yes If all of the above answers are "NO", then may proceed with Cephalosporin use.    Antimicrobials this admission: Azactam/Flagyl 12/4 x1 Vancomycin 12/4, 12/10>>12/11 Cefepime 12/10>>  Acyclovir 400 BID as PTA  Dose adjustments this admission: N/A  Microbiology results: 12/4 BCx: neg 12/4 Ucx: 70 K/ml Klebsiella, sens to all except R to Amp 12/4 COVID and flu: neg 12/10 Bcx: neg day  3  Thank you for allowing pharmacy to be a part of this patient's care.  Vaughan Basta BS, PharmD, BCPS Clinical Pharmacist 08/15/2021 9:19 AM  Please check AMION for all [place] phone numbers After 10:00 PM, call [place] [phone]

## 2021-08-15 NOTE — Progress Notes (Signed)
Occupational Therapy Treatment Patient Details Name: MALENI SEYER MRN: [id number] DOB: 1946-08-20 Today's Date: 08/15/2021   History of present illness 75 y.o. female who is admitted to [facility] on 08/06/2021 with acute bilateral pulmonary emboli with suggestion of cor pulmonale after presenting to [facility] ED complaining of shortness of breath. Pt recently on Eliquis for LE DVT but was on hold due to epistaxis requiring cauterization to stop bleeding. 08/07/21 +RLE DVT  PMH- significant for prior DVT, seizure disorder, GERD   OT comments  Patient supine in bed, encouragement to engage in OT session.  Reports not sleeping well, but agreeable to engage.  She requires mod assist for bed mobility, maintains sitting balance at EOB statically with min guard to min assist.  Max assist for lateral scoot into recliner.  Total assist for LB ADLs. Pt requires constant cueing for techniques, poor recall of technique to assist with transfer immediately after educating.  Pt up in chair with spouse and RN present.  Educated Therapist, sports to utilize steady to return back to bed.  Continue to recommend SNF.    Recommendations for follow up therapy are one component of a multi-disciplinary discharge planning process, led by the attending physician.  Recommendations may be updated based on patient status, additional functional criteria and insurance authorization.    Follow Up Recommendations  Skilled nursing-short term rehab (<3 hours/day)    Assistance Recommended at Discharge Frequent or [facility] bed;Other (comment) (BSC)    Recommendations for Other Services      Precautions / Restrictions Precautions Precautions: Fall;Other (comment) Precaution Comments: suprapubic catheter Restrictions Weight Bearing Restrictions: No       Mobility Bed Mobility Overal bed mobility: Needs Assistance Bed Mobility: Supine to Sit     Supine to sit: Mod assist      General bed mobility comments: able to manage LEs, assist for trunk and scooting forward    Transfers Overall transfer level: Needs assistance   Transfers: Bed to chair/wheelchair/BSC            Lateral/Scoot Transfers: Max assist General transfer comment: lateral scoot towards R side with max assist, cueing for forward lean and pushing through UEs     Balance Overall balance assessment: Needs assistance Sitting-balance support: Feet supported;Single extremity supported Sitting balance-Leahy Scale: Fair Sitting balance - Comments: preference to UE support, min assist to min guard at best statically                                   ADL either performed or assessed with clinical judgement   ADL Overall ADL's : Needs assistance/impaired                     Lower Body Dressing: Total assistance;Sitting/lateral leans   Toilet Transfer: Maximal assistance;+2 for safety/equipment (lateral scoot) Toilet Transfer Details (indicate cue type and reason): simulated to recliner towards R side lateral scoot         Functional mobility during ADLs: Maximal assistance;+2 for safety/equipment General ADL Comments: RN present for safety, pt with poor awareness, sequencing and recall of techniques.  Voicing "I don't know how to help" after educating on what to do to assist.    Extremity/Trunk Assessment              Vision       Perception     Praxis  Cognition Arousal/Alertness: Awake/alert Behavior During Therapy: WFL for tasks assessed/performed Overall Cognitive Status: Impaired/Different from baseline Area of Impairment: Problem solving;Safety/judgement;Awareness;Following commands;Attention;Memory                   Current Attention Level: Selective Memory: Decreased short-term memory Following Commands: Follows one step commands consistently;Follows one step commands with increased time;Follows multi-step commands  inconsistently Safety/Judgement: Decreased awareness of safety;Decreased awareness of deficits Awareness: Intellectual Problem Solving: Slow processing;Requires verbal cues;Requires tactile cues;Decreased initiation;Difficulty sequencing General Comments: pt with decreased problem solving and awareness, requires encouragement to participate in OOB activities          Exercises     Shoulder Instructions       General Comments VSS on O2 at 2L    Pertinent Vitals/ Pain       Pain Assessment: Faces Faces Pain Scale: Hurts a little bit Pain Location: bottom Pain Descriptors / Indicators: Discomfort Pain Intervention(s): Limited activity within patient's tolerance;Monitored during session;Repositioned  Home Living                                          Prior Functioning/Environment              Frequency  Min 2X/week        Progress Toward Goals  OT Goals(current goals can now be found in the care plan section)  Progress towards OT goals: Progressing toward goals  Acute Rehab OT Goals Patient Stated Goal: less pain OT Goal Formulation: With patient Time For Goal Achievement: 08/24/21 Potential to Achieve Goals: [place] Discharge plan remains appropriate;Frequency remains appropriate    Co-evaluation                 AM-PAC OT "6 Clicks" Daily Activity     Outcome Measure   Help from another person eating meals?: A Lot Help from another person taking care of personal grooming?: A Lot Help from another person toileting, which includes using toliet, bedpan, or urinal?: Total Help from another person bathing (including washing, rinsing, drying)?: A Lot Help from another person to put on and taking off regular upper body clothing?: A Lot Help from another person to put on and taking off regular lower body clothing?: Total 6 Click Score: 10    End of Session Equipment Utilized During Treatment: Oxygen  OT Visit Diagnosis: Other  abnormalities of gait and mobility (R26.89);Muscle weakness (generalized) (M62.81);Feeding difficulties (R63.3) Pain - part of body:  (bottom)   Activity Tolerance Patient tolerated treatment well   Patient Left in chair;with call bell/phone within reach;with nursing/sitter in room;with family/visitor present   Nurse Communication Mobility status;Need for lift equipment        Time: 3220-2542 OT Time Calculation (min): 33 min  Charges: OT General Charges $OT Visit: 1 Visit OT Treatments $Self Care/Home Management : 23-37 mins  [place] Pager [phone] Office [phone]   Delight Stare 08/15/2021, 11:04 AM

## 2021-08-16 DIAGNOSIS — M2550 Pain in unspecified joint: Secondary | ICD-10-CM | POA: Diagnosis not present

## 2021-08-16 DIAGNOSIS — E46 Unspecified protein-calorie malnutrition: Secondary | ICD-10-CM | POA: Diagnosis not present

## 2021-08-16 DIAGNOSIS — F5101 Primary insomnia: Secondary | ICD-10-CM | POA: Diagnosis not present

## 2021-08-16 DIAGNOSIS — F411 Generalized anxiety disorder: Secondary | ICD-10-CM | POA: Diagnosis not present

## 2021-08-16 DIAGNOSIS — Z86711 Personal history of pulmonary embolism: Secondary | ICD-10-CM | POA: Diagnosis not present

## 2021-08-16 DIAGNOSIS — G8222 Paraplegia, incomplete: Secondary | ICD-10-CM | POA: Diagnosis not present

## 2021-08-16 DIAGNOSIS — M6281 Muscle weakness (generalized): Secondary | ICD-10-CM | POA: Diagnosis not present

## 2021-08-16 DIAGNOSIS — M170 Bilateral primary osteoarthritis of knee: Secondary | ICD-10-CM | POA: Diagnosis not present

## 2021-08-16 DIAGNOSIS — I2699 Other pulmonary embolism without acute cor pulmonale: Secondary | ICD-10-CM | POA: Diagnosis not present

## 2021-08-16 DIAGNOSIS — D539 Nutritional anemia, unspecified: Secondary | ICD-10-CM | POA: Diagnosis not present

## 2021-08-16 DIAGNOSIS — J189 Pneumonia, unspecified organism: Secondary | ICD-10-CM | POA: Diagnosis not present

## 2021-08-16 DIAGNOSIS — N319 Neuromuscular dysfunction of bladder, unspecified: Secondary | ICD-10-CM | POA: Diagnosis not present

## 2021-08-16 DIAGNOSIS — R569 Unspecified convulsions: Secondary | ICD-10-CM | POA: Diagnosis not present

## 2021-08-16 DIAGNOSIS — J9601 Acute respiratory failure with hypoxia: Secondary | ICD-10-CM | POA: Diagnosis not present

## 2021-08-16 DIAGNOSIS — R8271 Bacteriuria: Secondary | ICD-10-CM | POA: Diagnosis not present

## 2021-08-16 DIAGNOSIS — N189 Chronic kidney disease, unspecified: Secondary | ICD-10-CM | POA: Diagnosis not present

## 2021-08-16 DIAGNOSIS — R0989 Other specified symptoms and signs involving the circulatory and respiratory systems: Secondary | ICD-10-CM | POA: Diagnosis not present

## 2021-08-16 DIAGNOSIS — R0602 Shortness of breath: Secondary | ICD-10-CM | POA: Diagnosis not present

## 2021-08-16 DIAGNOSIS — J168 Pneumonia due to other specified infectious organisms: Secondary | ICD-10-CM | POA: Diagnosis not present

## 2021-08-16 DIAGNOSIS — G990 Autonomic neuropathy in diseases classified elsewhere: Secondary | ICD-10-CM | POA: Diagnosis not present

## 2021-08-16 DIAGNOSIS — R5381 Other malaise: Secondary | ICD-10-CM | POA: Diagnosis not present

## 2021-08-16 DIAGNOSIS — F32A Depression, unspecified: Secondary | ICD-10-CM | POA: Diagnosis not present

## 2021-08-16 DIAGNOSIS — Z1159 Encounter for screening for other viral diseases: Secondary | ICD-10-CM | POA: Diagnosis not present

## 2021-08-16 DIAGNOSIS — F331 Major depressive disorder, recurrent, moderate: Secondary | ICD-10-CM | POA: Diagnosis not present

## 2021-08-16 DIAGNOSIS — R051 Acute cough: Secondary | ICD-10-CM | POA: Diagnosis not present

## 2021-08-16 DIAGNOSIS — Z743 Need for continuous supervision: Secondary | ICD-10-CM | POA: Diagnosis not present

## 2021-08-16 DIAGNOSIS — G894 Chronic pain syndrome: Secondary | ICD-10-CM | POA: Diagnosis not present

## 2021-08-16 DIAGNOSIS — R7303 Prediabetes: Secondary | ICD-10-CM | POA: Diagnosis not present

## 2021-08-16 DIAGNOSIS — B004 Herpesviral encephalitis: Secondary | ICD-10-CM | POA: Diagnosis not present

## 2021-08-16 DIAGNOSIS — I700 Atherosclerosis of aorta: Secondary | ICD-10-CM | POA: Diagnosis not present

## 2021-08-16 DIAGNOSIS — Z7401 Bed confinement status: Secondary | ICD-10-CM | POA: Diagnosis not present

## 2021-08-16 DIAGNOSIS — I82441 Acute embolism and thrombosis of right tibial vein: Secondary | ICD-10-CM | POA: Diagnosis not present

## 2021-08-16 DIAGNOSIS — I517 Cardiomegaly: Secondary | ICD-10-CM | POA: Diagnosis not present

## 2021-08-16 DIAGNOSIS — R457 State of emotional shock and stress, unspecified: Secondary | ICD-10-CM | POA: Diagnosis not present

## 2021-08-16 DIAGNOSIS — G2581 Restless legs syndrome: Secondary | ICD-10-CM | POA: Diagnosis not present

## 2021-08-16 DIAGNOSIS — G40909 Epilepsy, unspecified, not intractable, without status epilepticus: Secondary | ICD-10-CM | POA: Diagnosis not present

## 2021-08-16 NOTE — Progress Notes (Signed)
Patient discharged to [facility] per orders. Discharge instructions in discharge packet given to PTAR. Patient's husband verbalizes understanding. IV already removed, tele discontinued per orders. Belongings packed up by husband. This RN called [facility], and got report to [place], receiving nurse. Questions answered, and Beryle Quant verbalized understanding. Denies needs at this time. Patient off unit via stretcher by PTAR. Husband left unit shortly after.

## 2021-08-17 DIAGNOSIS — G8222 Paraplegia, incomplete: Secondary | ICD-10-CM | POA: Diagnosis not present

## 2021-08-17 LAB — CULTURE, BLOOD (ROUTINE X 2)
Culture: NO GROWTH
Culture: NO GROWTH
Special Requests: ADEQUATE
Special Requests: ADEQUATE

## 2021-08-22 ENCOUNTER — Telehealth: Payer: PPO | Admitting: Neurology

## 2021-08-22 DIAGNOSIS — J9601 Acute respiratory failure with hypoxia: Secondary | ICD-10-CM | POA: Diagnosis not present

## 2021-08-22 DIAGNOSIS — J189 Pneumonia, unspecified organism: Secondary | ICD-10-CM | POA: Diagnosis not present

## 2021-08-22 DIAGNOSIS — R0602 Shortness of breath: Secondary | ICD-10-CM | POA: Diagnosis not present

## 2021-08-22 DIAGNOSIS — R051 Acute cough: Secondary | ICD-10-CM | POA: Diagnosis not present

## 2021-08-22 NOTE — Progress Notes (Deleted)
Virtual Visit via Video Note  I connected with Katelyn Lamb on 08/22/21 at  3:45 PM EST by a video enabled telemedicine application and verified that I am speaking with the correct person using two identifiers.  Location: Patient: *** Provider: ***   I discussed the limitations of evaluation and management by telemedicine and the availability of in person appointments. The patient expressed understanding and agreed to proceed.  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  Was hospitalized in November for UTI, then again in early December 2022 with shortness of breath found to have PE and right lower extremity DVT.  02/09/2021 Dr. Jannifer Franklin: 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  She has a suprapubic catheter in place.  She has a chronic balance issue, she uses a walker for ambulation but she still will fall on occasion.  She often falls backwards on her rear end.  She is having some low back pain following a recent fall.  The patient takes Lyrica 100 mg in the evening, she has gait instability if she takes higher doses.  She is on a 5 mg prednisone dose daily which seems to help her function better.  She has ongoing discomfort that is neuropathic in nature involving the legs and hands.  She returns to this office for further evaluation.   Observations/Objective:   Assessment and Plan:   Follow Up Instructions:    I discussed the assessment and treatment plan with the patient. The patient was provided an opportunity to ask questions and all were answered. The patient agreed with the plan and demonstrated an understanding of the instructions.   The patient was advised to call back or seek an in-person evaluation if the symptoms worsen  or if the condition fails to improve as anticipated.  I provided *** minutes of non-face-to-face time during this encounter.   Suzzanne Cloud, NP

## 2021-08-23 DIAGNOSIS — R5381 Other malaise: Secondary | ICD-10-CM | POA: Diagnosis not present

## 2021-08-23 DIAGNOSIS — Z86711 Personal history of pulmonary embolism: Secondary | ICD-10-CM | POA: Diagnosis not present

## 2021-08-23 DIAGNOSIS — R051 Acute cough: Secondary | ICD-10-CM | POA: Diagnosis not present

## 2021-08-23 DIAGNOSIS — R0989 Other specified symptoms and signs involving the circulatory and respiratory systems: Secondary | ICD-10-CM | POA: Diagnosis not present

## 2021-08-24 DIAGNOSIS — J168 Pneumonia due to other specified infectious organisms: Secondary | ICD-10-CM | POA: Diagnosis not present

## 2021-08-24 DIAGNOSIS — J9601 Acute respiratory failure with hypoxia: Secondary | ICD-10-CM | POA: Diagnosis not present

## 2021-08-24 DIAGNOSIS — I2699 Other pulmonary embolism without acute cor pulmonale: Secondary | ICD-10-CM | POA: Diagnosis not present

## 2021-08-24 DIAGNOSIS — G8222 Paraplegia, incomplete: Secondary | ICD-10-CM | POA: Diagnosis not present

## 2021-08-25 DIAGNOSIS — M2550 Pain in unspecified joint: Secondary | ICD-10-CM | POA: Diagnosis not present

## 2021-08-25 DIAGNOSIS — Z7401 Bed confinement status: Secondary | ICD-10-CM | POA: Diagnosis not present

## 2021-08-26 DIAGNOSIS — M47813 Spondylosis without myelopathy or radiculopathy, cervicothoracic region: Secondary | ICD-10-CM | POA: Diagnosis not present

## 2021-08-26 DIAGNOSIS — I959 Hypotension, unspecified: Secondary | ICD-10-CM | POA: Diagnosis not present

## 2021-08-26 DIAGNOSIS — M4802 Spinal stenosis, cervical region: Secondary | ICD-10-CM | POA: Diagnosis not present

## 2021-08-26 DIAGNOSIS — R7303 Prediabetes: Secondary | ICD-10-CM | POA: Diagnosis not present

## 2021-08-26 DIAGNOSIS — Z993 Dependence on wheelchair: Secondary | ICD-10-CM | POA: Diagnosis not present

## 2021-08-26 DIAGNOSIS — M810 Age-related osteoporosis without current pathological fracture: Secondary | ICD-10-CM | POA: Diagnosis not present

## 2021-08-26 DIAGNOSIS — Z7401 Bed confinement status: Secondary | ICD-10-CM | POA: Diagnosis not present

## 2021-08-26 DIAGNOSIS — N319 Neuromuscular dysfunction of bladder, unspecified: Secondary | ICD-10-CM | POA: Diagnosis not present

## 2021-08-26 DIAGNOSIS — F411 Generalized anxiety disorder: Secondary | ICD-10-CM | POA: Diagnosis not present

## 2021-08-26 DIAGNOSIS — D62 Acute posthemorrhagic anemia: Secondary | ICD-10-CM | POA: Diagnosis not present

## 2021-08-26 DIAGNOSIS — Z435 Encounter for attention to cystostomy: Secondary | ICD-10-CM | POA: Diagnosis not present

## 2021-08-26 DIAGNOSIS — K5900 Constipation, unspecified: Secondary | ICD-10-CM | POA: Diagnosis not present

## 2021-08-26 DIAGNOSIS — Z87891 Personal history of nicotine dependence: Secondary | ICD-10-CM | POA: Diagnosis not present

## 2021-08-26 DIAGNOSIS — I82441 Acute embolism and thrombosis of right tibial vein: Secondary | ICD-10-CM | POA: Diagnosis not present

## 2021-08-26 DIAGNOSIS — H9193 Unspecified hearing loss, bilateral: Secondary | ICD-10-CM | POA: Diagnosis not present

## 2021-08-26 DIAGNOSIS — G2581 Restless legs syndrome: Secondary | ICD-10-CM | POA: Diagnosis not present

## 2021-08-26 DIAGNOSIS — I700 Atherosclerosis of aorta: Secondary | ICD-10-CM | POA: Diagnosis not present

## 2021-08-26 DIAGNOSIS — Z8701 Personal history of pneumonia (recurrent): Secondary | ICD-10-CM | POA: Diagnosis not present

## 2021-08-26 DIAGNOSIS — M47812 Spondylosis without myelopathy or radiculopathy, cervical region: Secondary | ICD-10-CM | POA: Diagnosis not present

## 2021-08-26 DIAGNOSIS — Z7952 Long term (current) use of systemic steroids: Secondary | ICD-10-CM | POA: Diagnosis not present

## 2021-08-26 DIAGNOSIS — F0283 Dementia in other diseases classified elsewhere, unspecified severity, with mood disturbance: Secondary | ICD-10-CM | POA: Diagnosis not present

## 2021-08-26 DIAGNOSIS — G8222 Paraplegia, incomplete: Secondary | ICD-10-CM | POA: Diagnosis not present

## 2021-08-26 DIAGNOSIS — M47814 Spondylosis without myelopathy or radiculopathy, thoracic region: Secondary | ICD-10-CM | POA: Diagnosis not present

## 2021-08-26 DIAGNOSIS — Z9981 Dependence on supplemental oxygen: Secondary | ICD-10-CM | POA: Diagnosis not present

## 2021-08-26 DIAGNOSIS — G40909 Epilepsy, unspecified, not intractable, without status epilepticus: Secondary | ICD-10-CM | POA: Diagnosis not present

## 2021-08-26 DIAGNOSIS — Z466 Encounter for fitting and adjustment of urinary device: Secondary | ICD-10-CM | POA: Diagnosis not present

## 2021-08-26 DIAGNOSIS — J9601 Acute respiratory failure with hypoxia: Secondary | ICD-10-CM | POA: Diagnosis not present

## 2021-08-26 DIAGNOSIS — Z79899 Other long term (current) drug therapy: Secondary | ICD-10-CM | POA: Diagnosis not present

## 2021-08-26 DIAGNOSIS — K219 Gastro-esophageal reflux disease without esophagitis: Secondary | ICD-10-CM | POA: Diagnosis not present

## 2021-08-26 DIAGNOSIS — I2699 Other pulmonary embolism without acute cor pulmonale: Secondary | ICD-10-CM | POA: Diagnosis not present

## 2021-08-26 DIAGNOSIS — N189 Chronic kidney disease, unspecified: Secondary | ICD-10-CM | POA: Diagnosis not present

## 2021-08-26 DIAGNOSIS — F5101 Primary insomnia: Secondary | ICD-10-CM | POA: Diagnosis not present

## 2021-08-26 DIAGNOSIS — F331 Major depressive disorder, recurrent, moderate: Secondary | ICD-10-CM | POA: Diagnosis not present

## 2021-08-26 DIAGNOSIS — Z86711 Personal history of pulmonary embolism: Secondary | ICD-10-CM | POA: Diagnosis not present

## 2021-08-26 DIAGNOSIS — R338 Other retention of urine: Secondary | ICD-10-CM | POA: Diagnosis not present

## 2021-08-26 DIAGNOSIS — R339 Retention of urine, unspecified: Secondary | ICD-10-CM | POA: Diagnosis not present

## 2021-08-26 DIAGNOSIS — Z7901 Long term (current) use of anticoagulants: Secondary | ICD-10-CM | POA: Diagnosis not present

## 2021-08-26 DIAGNOSIS — T83030D Leakage of cystostomy catheter, subsequent encounter: Secondary | ICD-10-CM | POA: Diagnosis not present

## 2021-08-26 DIAGNOSIS — E46 Unspecified protein-calorie malnutrition: Secondary | ICD-10-CM | POA: Diagnosis not present

## 2021-08-26 DIAGNOSIS — Z87440 Personal history of urinary (tract) infections: Secondary | ICD-10-CM | POA: Diagnosis not present

## 2021-08-26 DIAGNOSIS — N390 Urinary tract infection, site not specified: Secondary | ICD-10-CM | POA: Diagnosis not present

## 2021-08-26 DIAGNOSIS — Z86718 Personal history of other venous thrombosis and embolism: Secondary | ICD-10-CM | POA: Diagnosis not present

## 2021-08-26 DIAGNOSIS — M170 Bilateral primary osteoarthritis of knee: Secondary | ICD-10-CM | POA: Diagnosis not present

## 2021-08-26 DIAGNOSIS — G894 Chronic pain syndrome: Secondary | ICD-10-CM | POA: Diagnosis not present

## 2021-08-30 DIAGNOSIS — R339 Retention of urine, unspecified: Secondary | ICD-10-CM | POA: Diagnosis not present

## 2021-09-01 DIAGNOSIS — F411 Generalized anxiety disorder: Secondary | ICD-10-CM | POA: Diagnosis not present

## 2021-09-01 DIAGNOSIS — G894 Chronic pain syndrome: Secondary | ICD-10-CM | POA: Diagnosis not present

## 2021-09-01 DIAGNOSIS — I2699 Other pulmonary embolism without acute cor pulmonale: Secondary | ICD-10-CM | POA: Diagnosis not present

## 2021-09-01 DIAGNOSIS — N189 Chronic kidney disease, unspecified: Secondary | ICD-10-CM | POA: Diagnosis not present

## 2021-09-01 DIAGNOSIS — Z86718 Personal history of other venous thrombosis and embolism: Secondary | ICD-10-CM | POA: Diagnosis not present

## 2021-09-01 DIAGNOSIS — Z87440 Personal history of urinary (tract) infections: Secondary | ICD-10-CM | POA: Diagnosis not present

## 2021-09-01 DIAGNOSIS — F5101 Primary insomnia: Secondary | ICD-10-CM | POA: Diagnosis not present

## 2021-09-01 DIAGNOSIS — E46 Unspecified protein-calorie malnutrition: Secondary | ICD-10-CM | POA: Diagnosis not present

## 2021-09-01 DIAGNOSIS — I82441 Acute embolism and thrombosis of right tibial vein: Secondary | ICD-10-CM | POA: Diagnosis not present

## 2021-09-01 DIAGNOSIS — M170 Bilateral primary osteoarthritis of knee: Secondary | ICD-10-CM | POA: Diagnosis not present

## 2021-09-01 DIAGNOSIS — N319 Neuromuscular dysfunction of bladder, unspecified: Secondary | ICD-10-CM | POA: Diagnosis not present

## 2021-09-01 DIAGNOSIS — G40909 Epilepsy, unspecified, not intractable, without status epilepticus: Secondary | ICD-10-CM | POA: Diagnosis not present

## 2021-09-01 DIAGNOSIS — F331 Major depressive disorder, recurrent, moderate: Secondary | ICD-10-CM | POA: Diagnosis not present

## 2021-09-01 DIAGNOSIS — Z435 Encounter for attention to cystostomy: Secondary | ICD-10-CM | POA: Diagnosis not present

## 2021-09-01 DIAGNOSIS — J9601 Acute respiratory failure with hypoxia: Secondary | ICD-10-CM | POA: Diagnosis not present

## 2021-09-01 DIAGNOSIS — G2581 Restless legs syndrome: Secondary | ICD-10-CM | POA: Diagnosis not present

## 2021-09-01 DIAGNOSIS — K219 Gastro-esophageal reflux disease without esophagitis: Secondary | ICD-10-CM | POA: Diagnosis not present

## 2021-09-01 DIAGNOSIS — R338 Other retention of urine: Secondary | ICD-10-CM | POA: Diagnosis not present

## 2021-09-01 DIAGNOSIS — Z7901 Long term (current) use of anticoagulants: Secondary | ICD-10-CM | POA: Diagnosis not present

## 2021-09-01 DIAGNOSIS — I700 Atherosclerosis of aorta: Secondary | ICD-10-CM | POA: Diagnosis not present

## 2021-09-01 DIAGNOSIS — Z7952 Long term (current) use of systemic steroids: Secondary | ICD-10-CM | POA: Diagnosis not present

## 2021-09-01 DIAGNOSIS — R7303 Prediabetes: Secondary | ICD-10-CM | POA: Diagnosis not present

## 2021-09-01 DIAGNOSIS — Z9981 Dependence on supplemental oxygen: Secondary | ICD-10-CM | POA: Diagnosis not present

## 2021-09-01 DIAGNOSIS — Z466 Encounter for fitting and adjustment of urinary device: Secondary | ICD-10-CM | POA: Diagnosis not present

## 2021-09-04 DIAGNOSIS — F5101 Primary insomnia: Secondary | ICD-10-CM | POA: Diagnosis not present

## 2021-09-04 DIAGNOSIS — F411 Generalized anxiety disorder: Secondary | ICD-10-CM | POA: Diagnosis not present

## 2021-09-04 DIAGNOSIS — R338 Other retention of urine: Secondary | ICD-10-CM | POA: Diagnosis not present

## 2021-09-04 DIAGNOSIS — I82441 Acute embolism and thrombosis of right tibial vein: Secondary | ICD-10-CM | POA: Diagnosis not present

## 2021-09-04 DIAGNOSIS — I2699 Other pulmonary embolism without acute cor pulmonale: Secondary | ICD-10-CM | POA: Diagnosis not present

## 2021-09-04 DIAGNOSIS — N319 Neuromuscular dysfunction of bladder, unspecified: Secondary | ICD-10-CM | POA: Diagnosis not present

## 2021-09-04 DIAGNOSIS — K219 Gastro-esophageal reflux disease without esophagitis: Secondary | ICD-10-CM | POA: Diagnosis not present

## 2021-09-04 DIAGNOSIS — Z9981 Dependence on supplemental oxygen: Secondary | ICD-10-CM | POA: Diagnosis not present

## 2021-09-04 DIAGNOSIS — N189 Chronic kidney disease, unspecified: Secondary | ICD-10-CM | POA: Diagnosis not present

## 2021-09-04 DIAGNOSIS — M170 Bilateral primary osteoarthritis of knee: Secondary | ICD-10-CM | POA: Diagnosis not present

## 2021-09-04 DIAGNOSIS — J9601 Acute respiratory failure with hypoxia: Secondary | ICD-10-CM | POA: Diagnosis not present

## 2021-09-04 DIAGNOSIS — G2581 Restless legs syndrome: Secondary | ICD-10-CM | POA: Diagnosis not present

## 2021-09-04 DIAGNOSIS — Z7901 Long term (current) use of anticoagulants: Secondary | ICD-10-CM | POA: Diagnosis not present

## 2021-09-04 DIAGNOSIS — E46 Unspecified protein-calorie malnutrition: Secondary | ICD-10-CM | POA: Diagnosis not present

## 2021-09-04 DIAGNOSIS — G894 Chronic pain syndrome: Secondary | ICD-10-CM | POA: Diagnosis not present

## 2021-09-04 DIAGNOSIS — I700 Atherosclerosis of aorta: Secondary | ICD-10-CM | POA: Diagnosis not present

## 2021-09-04 DIAGNOSIS — Z7952 Long term (current) use of systemic steroids: Secondary | ICD-10-CM | POA: Diagnosis not present

## 2021-09-04 DIAGNOSIS — R7303 Prediabetes: Secondary | ICD-10-CM | POA: Diagnosis not present

## 2021-09-04 DIAGNOSIS — Z86718 Personal history of other venous thrombosis and embolism: Secondary | ICD-10-CM | POA: Diagnosis not present

## 2021-09-04 DIAGNOSIS — Z87440 Personal history of urinary (tract) infections: Secondary | ICD-10-CM | POA: Diagnosis not present

## 2021-09-04 DIAGNOSIS — Z435 Encounter for attention to cystostomy: Secondary | ICD-10-CM | POA: Diagnosis not present

## 2021-09-04 DIAGNOSIS — G8222 Paraplegia, incomplete: Secondary | ICD-10-CM | POA: Diagnosis not present

## 2021-09-04 DIAGNOSIS — Z466 Encounter for fitting and adjustment of urinary device: Secondary | ICD-10-CM | POA: Diagnosis not present

## 2021-09-04 DIAGNOSIS — G40909 Epilepsy, unspecified, not intractable, without status epilepticus: Secondary | ICD-10-CM | POA: Diagnosis not present

## 2021-09-04 DIAGNOSIS — F331 Major depressive disorder, recurrent, moderate: Secondary | ICD-10-CM | POA: Diagnosis not present

## 2021-09-13 DIAGNOSIS — G894 Chronic pain syndrome: Secondary | ICD-10-CM | POA: Diagnosis not present

## 2021-09-13 DIAGNOSIS — Z466 Encounter for fitting and adjustment of urinary device: Secondary | ICD-10-CM | POA: Diagnosis not present

## 2021-09-13 DIAGNOSIS — Z7952 Long term (current) use of systemic steroids: Secondary | ICD-10-CM | POA: Diagnosis not present

## 2021-09-13 DIAGNOSIS — R338 Other retention of urine: Secondary | ICD-10-CM | POA: Diagnosis not present

## 2021-09-13 DIAGNOSIS — G2581 Restless legs syndrome: Secondary | ICD-10-CM | POA: Diagnosis not present

## 2021-09-13 DIAGNOSIS — Z87440 Personal history of urinary (tract) infections: Secondary | ICD-10-CM | POA: Diagnosis not present

## 2021-09-13 DIAGNOSIS — J9601 Acute respiratory failure with hypoxia: Secondary | ICD-10-CM | POA: Diagnosis not present

## 2021-09-13 DIAGNOSIS — Z8709 Personal history of other diseases of the respiratory system: Secondary | ICD-10-CM | POA: Diagnosis not present

## 2021-09-13 DIAGNOSIS — G40909 Epilepsy, unspecified, not intractable, without status epilepticus: Secondary | ICD-10-CM | POA: Diagnosis not present

## 2021-09-13 DIAGNOSIS — I2699 Other pulmonary embolism without acute cor pulmonale: Secondary | ICD-10-CM | POA: Diagnosis not present

## 2021-09-13 DIAGNOSIS — F331 Major depressive disorder, recurrent, moderate: Secondary | ICD-10-CM | POA: Diagnosis not present

## 2021-09-13 DIAGNOSIS — I82441 Acute embolism and thrombosis of right tibial vein: Secondary | ICD-10-CM | POA: Diagnosis not present

## 2021-09-13 DIAGNOSIS — Z7901 Long term (current) use of anticoagulants: Secondary | ICD-10-CM | POA: Diagnosis not present

## 2021-09-13 DIAGNOSIS — R7303 Prediabetes: Secondary | ICD-10-CM | POA: Diagnosis not present

## 2021-09-13 DIAGNOSIS — Z435 Encounter for attention to cystostomy: Secondary | ICD-10-CM | POA: Diagnosis not present

## 2021-09-13 DIAGNOSIS — F5101 Primary insomnia: Secondary | ICD-10-CM | POA: Diagnosis not present

## 2021-09-13 DIAGNOSIS — E46 Unspecified protein-calorie malnutrition: Secondary | ICD-10-CM | POA: Diagnosis not present

## 2021-09-13 DIAGNOSIS — N319 Neuromuscular dysfunction of bladder, unspecified: Secondary | ICD-10-CM | POA: Diagnosis not present

## 2021-09-13 DIAGNOSIS — K219 Gastro-esophageal reflux disease without esophagitis: Secondary | ICD-10-CM | POA: Diagnosis not present

## 2021-09-13 DIAGNOSIS — Z9359 Other cystostomy status: Secondary | ICD-10-CM | POA: Diagnosis not present

## 2021-09-13 DIAGNOSIS — I700 Atherosclerosis of aorta: Secondary | ICD-10-CM | POA: Diagnosis not present

## 2021-09-13 DIAGNOSIS — Z86718 Personal history of other venous thrombosis and embolism: Secondary | ICD-10-CM | POA: Diagnosis not present

## 2021-09-13 DIAGNOSIS — N189 Chronic kidney disease, unspecified: Secondary | ICD-10-CM | POA: Diagnosis not present

## 2021-09-13 DIAGNOSIS — F411 Generalized anxiety disorder: Secondary | ICD-10-CM | POA: Diagnosis not present

## 2021-09-13 DIAGNOSIS — Z9981 Dependence on supplemental oxygen: Secondary | ICD-10-CM | POA: Diagnosis not present

## 2021-09-13 DIAGNOSIS — M170 Bilateral primary osteoarthritis of knee: Secondary | ICD-10-CM | POA: Diagnosis not present

## 2021-09-15 ENCOUNTER — Emergency Department (HOSPITAL_COMMUNITY): Payer: PPO

## 2021-09-15 ENCOUNTER — Observation Stay (HOSPITAL_COMMUNITY): Payer: PPO

## 2021-09-15 ENCOUNTER — Observation Stay (HOSPITAL_COMMUNITY)
Admission: EM | Admit: 2021-09-15 | Discharge: 2021-09-17 | Disposition: A | Discharge status: Home / Self Care | Payer: PPO | Attending: Internal Medicine | Admitting: Internal Medicine

## 2021-09-15 ENCOUNTER — Encounter (HOSPITAL_COMMUNITY): Payer: Self-pay | Admitting: Internal Medicine

## 2021-09-15 DIAGNOSIS — R531 Weakness: Principal | ICD-10-CM | POA: Insufficient documentation

## 2021-09-15 DIAGNOSIS — M2578 Osteophyte, vertebrae: Secondary | ICD-10-CM | POA: Diagnosis not present

## 2021-09-15 DIAGNOSIS — Z79899 Other long term (current) drug therapy: Secondary | ICD-10-CM | POA: Diagnosis not present

## 2021-09-15 DIAGNOSIS — G894 Chronic pain syndrome: Secondary | ICD-10-CM | POA: Diagnosis present

## 2021-09-15 DIAGNOSIS — F418 Other specified anxiety disorders: Secondary | ICD-10-CM | POA: Diagnosis present

## 2021-09-15 DIAGNOSIS — R29818 Other symptoms and signs involving the nervous system: Secondary | ICD-10-CM | POA: Diagnosis not present

## 2021-09-15 DIAGNOSIS — R2681 Unsteadiness on feet: Secondary | ICD-10-CM | POA: Insufficient documentation

## 2021-09-15 DIAGNOSIS — G0490 Encephalitis and encephalomyelitis, unspecified: Secondary | ICD-10-CM | POA: Diagnosis present

## 2021-09-15 DIAGNOSIS — Z86711 Personal history of pulmonary embolism: Secondary | ICD-10-CM | POA: Diagnosis not present

## 2021-09-15 DIAGNOSIS — N189 Chronic kidney disease, unspecified: Secondary | ICD-10-CM | POA: Insufficient documentation

## 2021-09-15 DIAGNOSIS — R29898 Other symptoms and signs involving the musculoskeletal system: Secondary | ICD-10-CM | POA: Diagnosis not present

## 2021-09-15 DIAGNOSIS — G40909 Epilepsy, unspecified, not intractable, without status epilepticus: Secondary | ICD-10-CM | POA: Diagnosis not present

## 2021-09-15 DIAGNOSIS — I10 Essential (primary) hypertension: Secondary | ICD-10-CM | POA: Diagnosis not present

## 2021-09-15 DIAGNOSIS — Z7901 Long term (current) use of anticoagulants: Secondary | ICD-10-CM | POA: Diagnosis not present

## 2021-09-15 DIAGNOSIS — G8222 Paraplegia, incomplete: Secondary | ICD-10-CM | POA: Diagnosis present

## 2021-09-15 DIAGNOSIS — R0902 Hypoxemia: Secondary | ICD-10-CM | POA: Diagnosis not present

## 2021-09-15 DIAGNOSIS — G319 Degenerative disease of nervous system, unspecified: Secondary | ICD-10-CM | POA: Diagnosis not present

## 2021-09-15 DIAGNOSIS — I639 Cerebral infarction, unspecified: Secondary | ICD-10-CM | POA: Diagnosis not present

## 2021-09-15 DIAGNOSIS — Z87891 Personal history of nicotine dependence: Secondary | ICD-10-CM | POA: Insufficient documentation

## 2021-09-15 DIAGNOSIS — R2990 Unspecified symptoms and signs involving the nervous system: Secondary | ICD-10-CM

## 2021-09-15 DIAGNOSIS — Z9359 Other cystostomy status: Secondary | ICD-10-CM

## 2021-09-15 DIAGNOSIS — R9431 Abnormal electrocardiogram [ECG] [EKG]: Secondary | ICD-10-CM | POA: Diagnosis not present

## 2021-09-15 DIAGNOSIS — Z20822 Contact with and (suspected) exposure to covid-19: Secondary | ICD-10-CM | POA: Diagnosis not present

## 2021-09-15 DIAGNOSIS — Z66 Do not resuscitate: Secondary | ICD-10-CM | POA: Diagnosis present

## 2021-09-15 DIAGNOSIS — R4589 Other symptoms and signs involving emotional state: Secondary | ICD-10-CM | POA: Diagnosis present

## 2021-09-15 LAB — I-STAT CHEM 8, ED
BUN: 19 mg/dL (ref 8–23)
Calcium, Ion: 1.19 mmol/L (ref 1.15–1.40)
Chloride: 101 mmol/L (ref 98–111)
Creatinine, Ser: 0.8 mg/dL (ref 0.44–1.00)
Glucose, Bld: 87 mg/dL (ref 70–99)
HCT: 37 % (ref 36.0–46.0)
Hemoglobin: 12.6 g/dL (ref 12.0–15.0)
Potassium: 4.1 mmol/L (ref 3.5–5.1)
Sodium: 139 mmol/L (ref 135–145)
TCO2: 31 mmol/L (ref 22–32)

## 2021-09-15 LAB — CBC
HCT: 37.5 % (ref 36.0–46.0)
Hemoglobin: 11.1 g/dL — ABNORMAL LOW (ref 12.0–15.0)
MCH: 28.7 pg (ref 26.0–34.0)
MCHC: 29.6 g/dL — ABNORMAL LOW (ref 30.0–36.0)
MCV: 96.9 fL (ref 80.0–100.0)
Platelets: 249 10*3/uL (ref 150–400)
RBC: 3.87 MIL/uL (ref 3.87–5.11)
RDW: 17.2 % — ABNORMAL HIGH (ref 11.5–15.5)
WBC: 10.2 10*3/uL (ref 4.0–10.5)
nRBC: 0 % (ref 0.0–0.2)

## 2021-09-15 LAB — DIFFERENTIAL
Abs Immature Granulocytes: 0.03 10*3/uL (ref 0.00–0.07)
Basophils Absolute: 0 10*3/uL (ref 0.0–0.1)
Basophils Relative: 0 %
Eosinophils Absolute: 0.2 10*3/uL (ref 0.0–0.5)
Eosinophils Relative: 2 %
Immature Granulocytes: 0 %
Lymphocytes Relative: 40 %
Lymphs Abs: 4.1 10*3/uL — ABNORMAL HIGH (ref 0.7–4.0)
Monocytes Absolute: 0.8 10*3/uL (ref 0.1–1.0)
Monocytes Relative: 8 %
Neutro Abs: 5 10*3/uL (ref 1.7–7.7)
Neutrophils Relative %: 50 %

## 2021-09-15 LAB — COMPREHENSIVE METABOLIC PANEL
ALT: 25 U/L (ref 0–44)
AST: 28 U/L (ref 15–41)
Albumin: 3.5 g/dL (ref 3.5–5.0)
Alkaline Phosphatase: 68 U/L (ref 38–126)
Anion gap: 7 (ref 5–15)
BUN: 17 mg/dL (ref 8–23)
CO2: 27 mmol/L (ref 22–32)
Calcium: 8.7 mg/dL — ABNORMAL LOW (ref 8.9–10.3)
Chloride: 102 mmol/L (ref 98–111)
Creatinine, Ser: 0.82 mg/dL (ref 0.44–1.00)
GFR, Estimated: >60 mL/min (ref >60)
Glucose, Bld: 90 mg/dL (ref 70–99)
Potassium: 4 mmol/L (ref 3.5–5.1)
Sodium: 136 mmol/L (ref 135–145)
Total Bilirubin: 0.5 mg/dL (ref 0.3–1.2)
Total Protein: 6.6 g/dL (ref 6.5–8.1)

## 2021-09-15 LAB — CBG MONITORING, ED: Glucose-Capillary: 93 mg/dL (ref 70–99)

## 2021-09-15 LAB — RESP PANEL BY RT-PCR (FLU A&B, COVID) ARPGX2
Influenza A by PCR: NEGATIVE
Influenza B by PCR: NEGATIVE
SARS Coronavirus 2 by RT PCR: NEGATIVE

## 2021-09-15 LAB — PROTIME-INR
INR: 1.3 — ABNORMAL HIGH (ref 0.8–1.2)
Prothrombin Time: 16 seconds — ABNORMAL HIGH (ref 11.4–15.2)

## 2021-09-15 LAB — APTT: aPTT: 32 seconds (ref 24–36)

## 2021-09-15 LAB — ETHANOL: Alcohol, Ethyl (B): <10 mg/dL (ref <10)

## 2021-09-15 MED ORDER — DULOXETINE HCL 60 MG PO CPEP
60.0000 mg | ORAL_CAPSULE | Freq: Every day | ORAL | Status: DC
  Administered 2021-09-15 – 2021-09-17 (×3): 60 mg via ORAL
  Filled 2021-09-15 (×3): qty 1

## 2021-09-15 MED ORDER — PREDNISONE 5 MG PO TABS
5.0000 mg | ORAL_TABLET | Freq: Every day | ORAL | Status: DC
  Administered 2021-09-16 – 2021-09-17 (×2): 5 mg via ORAL
  Filled 2021-09-15 (×2): qty 1

## 2021-09-15 MED ORDER — PROPOFOL 1000 MG/100ML IV EMUL
INTRAVENOUS | Status: AC
  Filled 2021-09-15: qty 100

## 2021-09-15 MED ORDER — ACETAMINOPHEN 325 MG PO TABS: 650.0000 mg | ORAL_TABLET | ORAL | Status: DC | PRN

## 2021-09-15 MED ORDER — ACETAMINOPHEN 325 MG PO TABS: 650.0000 mg | ORAL_TABLET | Freq: Four times a day (QID) | ORAL | Status: DC | PRN

## 2021-09-15 MED ORDER — QUETIAPINE FUMARATE 100 MG PO TABS
100.0000 mg | ORAL_TABLET | Freq: Every day | ORAL | Status: DC
  Administered 2021-09-15 – 2021-09-17 (×3): 100 mg via ORAL
  Filled 2021-09-15 (×3): qty 1

## 2021-09-15 MED ORDER — ACETAMINOPHEN 650 MG RE SUPP: 650.0000 mg | Freq: Four times a day (QID) | RECTAL | Status: DC | PRN

## 2021-09-15 MED ORDER — SENNOSIDES-DOCUSATE SODIUM 8.6-50 MG PO TABS: 1.0000 | ORAL_TABLET | Freq: Every evening | ORAL | Status: DC | PRN

## 2021-09-15 MED ORDER — ALBUTEROL SULFATE (2.5 MG/3ML) 0.083% IN NEBU: 2.5000 mg | INHALATION_SOLUTION | RESPIRATORY_TRACT | Status: DC | PRN

## 2021-09-15 MED ORDER — HYDROCODONE-ACETAMINOPHEN 10-325 MG PO TABS: 1.0000 | ORAL_TABLET | Freq: Three times a day (TID) | ORAL | Status: DC | PRN

## 2021-09-15 MED ORDER — PREGABALIN 75 MG PO CAPS
150.0000 mg | ORAL_CAPSULE | Freq: Every day | ORAL | Status: DC
  Administered 2021-09-15 – 2021-09-17 (×3): 150 mg via ORAL
  Filled 2021-09-15 (×3): qty 2

## 2021-09-15 MED ORDER — DIAZEPAM 5 MG PO TABS: 5.0000 mg | ORAL_TABLET | Freq: Two times a day (BID) | ORAL | Status: DC | PRN

## 2021-09-15 MED ORDER — ONDANSETRON HCL 4 MG/2ML IJ SOLN: 4.0000 mg | Freq: Four times a day (QID) | INTRAMUSCULAR | Status: DC | PRN

## 2021-09-15 MED ORDER — LEVETIRACETAM 250 MG PO TABS
250.0000 mg | ORAL_TABLET | Freq: Two times a day (BID) | ORAL | Status: DC
  Administered 2021-09-15 – 2021-09-17 (×6): 250 mg via ORAL
  Filled 2021-09-15 (×7): qty 1

## 2021-09-15 MED ORDER — MIRABEGRON ER 50 MG PO TB24
50.0000 mg | ORAL_TABLET | Freq: Every day | ORAL | Status: DC
  Administered 2021-09-15 – 2021-09-17 (×3): 50 mg via ORAL
  Filled 2021-09-15 (×4): qty 1

## 2021-09-15 MED ORDER — ACETAMINOPHEN 160 MG/5ML PO SOLN: 650.0000 mg | ORAL | Status: DC | PRN

## 2021-09-15 MED ORDER — PANTOPRAZOLE SODIUM 40 MG PO TBEC
40.0000 mg | DELAYED_RELEASE_TABLET | Freq: Every day | ORAL | Status: DC
  Administered 2021-09-15 – 2021-09-17 (×3): 40 mg via ORAL
  Filled 2021-09-15 (×3): qty 1

## 2021-09-15 MED ORDER — ACETAMINOPHEN 650 MG RE SUPP: 650.0000 mg | RECTAL | Status: DC | PRN

## 2021-09-15 MED ORDER — OMEGA-3-ACID ETHYL ESTERS 1 G PO CAPS
1.0000 g | ORAL_CAPSULE | Freq: Every morning | ORAL | Status: DC
  Administered 2021-09-15 – 2021-09-17 (×3): 1 g via ORAL
  Filled 2021-09-15 (×3): qty 1

## 2021-09-15 MED ORDER — STROKE: EARLY STAGES OF RECOVERY BOOK: Freq: Once | Status: AC

## 2021-09-15 MED ORDER — ACYCLOVIR 400 MG PO TABS
400.0000 mg | ORAL_TABLET | Freq: Two times a day (BID) | ORAL | Status: DC
  Administered 2021-09-15 – 2021-09-17 (×6): 400 mg via ORAL
  Filled 2021-09-15 (×7): qty 1

## 2021-09-15 MED ORDER — SODIUM CHLORIDE 0.9 % IV SOLN: INTRAVENOUS | Status: DC

## 2021-09-15 MED ORDER — APIXABAN 5 MG PO TABS
5.0000 mg | ORAL_TABLET | Freq: Two times a day (BID) | ORAL | Status: DC
Start: 2021-09-15 — End: 2021-09-18
  Administered 2021-09-15 – 2021-09-17 (×6): 5 mg via ORAL
  Filled 2021-09-15 (×6): qty 1

## 2021-09-15 NOTE — ED Notes (Signed)
EEG in progress at bedside

## 2021-09-15 NOTE — Assessment & Plan Note (Signed)
-  She has chronic debility and chronic pain -She is not on Keppra for seizures - denies h/o seizures - but rather for pain control according to family -Continue Lyrica (tapering off), Keppra -Continue Norco -Continue Cymbalta -Continue prednisone -I have reviewed this patient in the [place] Controlled Substances Reporting System.  She is receiving medications from two providers but appears to be taking them as prescribed. -She is not at particularly high risk of opioid misuse, diversion, or overdose.

## 2021-09-15 NOTE — Assessment & Plan Note (Signed)
-  Continue Cymbalta, Seroquel, Valium

## 2021-09-15 NOTE — Assessment & Plan Note (Signed)
-  h/o remote infection with chronic debility -She continues to take BID acyclovir

## 2021-09-15 NOTE — Assessment & Plan Note (Addendum)
-  She has chronic generalized weakness and did not appear to have focal findings at the time of admission today -It is unclear how much of her presentation is acute vs. Chronic -Her husband recently hired help at home, ?need for placement -PT/OT consults requested

## 2021-09-15 NOTE — Consult Note (Addendum)
Neurology Consultation  Reason for Consult: Code stroke for left-sided weakness Referring Physician: Dr. Deno Etienne  CC: Left-sided weakness  History is obtained from: Chart, EMS, patient  HPI: MANHATTAN MCCUEN is a 76 y.o. female past medical history of HSV encephalomyelitis with progressive gait disorder, neurogenic bladder, seizure disorder, progressive memory difficulties, anxiety, back pain, DVT on Eliquis-brought in for evaluation of weakness. Last known well was when she went to bed at 10 PM.  Upon waking up this morning, required help from the husband to sit on the commode but needed more assistance to come off the commode and husband could not do it and hence he called EMS.  EMS noted some left-sided weakness worse in the lower extremity for which they called a code stroke-LVO negative. Patient reports difficulty with moving her left leg that started sometime last night. Denies any headaches or preceding illness sickness fevers chills. Denies any abdominal pain nausea vomiting. She has a suprapubic catheter in place. No report of witnessed seizure.  LKW: 10 PM last night tpa given?: no, outside the window Premorbid modified Rankin scale (mRS): 4-has been using walker for ambulation or worse  ROS: Full ROS was performed and is negative except as noted in the HPI.   Past Medical History:  Diagnosis Date   Acute deep vein thrombosis (DVT) of popliteal vein of left lower extremity (HCC)    Anxiety    Back pain    Chronic kidney disease    Encephalomyelitis    Gait abnormality 11/14/2016   Memory difficulty 03/02/2019   Myelitis due to herpes simplex [facility])    Neurogenic bladder     Family History  Problem Relation Age of Onset   Hypertension Mother     Social History:   reports that she has quit smoking. She has never used smokeless tobacco. She reports that she does not drink alcohol and does not use drugs.  Medications No current facility-administered medications for  this encounter.  Current Outpatient Medications:    acyclovir (ZOVIRAX) 400 MG tablet, TAKE 1 TABLET BY MOUTH TWICE A DAY (Patient taking differently: Take 400 mg by mouth 2 (two) times daily.), Disp: 180 tablet, Rfl: 1   albuterol (PROVENTIL) (2.5 MG/3ML) 0.083% nebulizer solution, Take 3 mLs (2.5 mg total) by nebulization every 2 (two) hours as needed for wheezing or shortness of breath., Disp: 75 mL, Rfl: 12   apixaban (ELIQUIS) 5 MG TABS tablet, Take 1 tablet (5 mg total) by mouth 2 (two) times daily., Disp: 60 tablet, Rfl:    Ascorbic Acid (VITAMIN C) 500 MG CHEW, Chew 500 mg by mouth daily., Disp: , Rfl:    benzonatate (TESSALON) 200 MG capsule, Take 1 capsule (200 mg total) by mouth 3 (three) times daily., Disp: 20 capsule, Rfl: 0   Calcium Carb-Cholecalciferol (CALCIUM 600 + D PO), Take 1 tablet by mouth in the morning and at bedtime., Disp: , Rfl:    Cyanocobalamin (VITAMIN B-12 PO), Take 500 mcg by mouth daily., Disp: , Rfl:    diazepam (VALIUM) 5 MG tablet, Take 1 tablet (5 mg total) by mouth every 12 (twelve) hours as needed for anxiety or muscle spasms. For tremors, Disp: 10 tablet, Rfl: 0   docusate sodium (COLACE) 100 MG capsule, Take 200 mg by mouth daily., Disp: , Rfl:    DULoxetine (CYMBALTA) 30 MG capsule, Take 30 mg by mouth in the morning., Disp: , Rfl:    DULoxetine (CYMBALTA) 60 MG capsule, Take 1 capsule (60 mg total)  by mouth at bedtime., Disp: 30 capsule, Rfl:    feeding supplement (ENSURE ENLIVE / ENSURE PLUS) LIQD, Take 237 mLs by mouth 2 (two) times daily between meals., Disp: 237 mL, Rfl: 12   ferrous sulfate 325 (65 FE) MG tablet, Take 325 mg by mouth every morning., Disp: , Rfl:    guaiFENesin (MUCINEX) 600 MG 12 hr tablet, Take 2 tablets (1,200 mg total) by mouth 2 (two) times daily., Disp: , Rfl:    ipratropium-albuterol (DUONEB) 0.5-2.5 (3) MG/3ML SOLN, Take 3 mLs by nebulization 3 (three) times daily., Disp: 360 mL, Rfl:    levETIRAcetam (KEPPRA) 250 MG tablet,  TAKE 1 TABLET BY MOUTH TWICE A DAY (Patient taking differently: Take 250 mg by mouth 2 (two) times daily.), Disp: 180 tablet, Rfl: 1   loperamide (IMODIUM) 2 MG capsule, Take 2 mg by mouth daily as needed for diarrhea or loose stools., Disp: , Rfl:    mirabegron ER (MYRBETRIQ) 50 MG TB24 tablet, Take 50 mg by mouth at bedtime., Disp: , Rfl:    omega-3 acid ethyl esters (LOVAZA) 1 g capsule, Take 1 g by mouth every morning., Disp: , Rfl:    pantoprazole (PROTONIX) 40 MG tablet, Take 1 tablet (40 mg total) by mouth daily., Disp: 30 tablet, Rfl: 1   polyethylene glycol (MIRALAX / GLYCOLAX) packet, Take 17 g by mouth daily., Disp: 14 each, Rfl: 0   predniSONE (DELTASONE) 5 MG tablet, TAKE 1 TABLET BY MOUTH EVERY DAY WITH BREAKFAST (Patient taking differently: Take 5 mg by mouth daily with breakfast.), Disp: 90 tablet, Rfl: 1   pregabalin (LYRICA) 50 MG capsule, Take 2 capsules (100 mg total) by mouth at bedtime., Disp: 180 capsule, Rfl: 1   QUEtiapine (SEROQUEL) 100 MG tablet, Take 100 mg by mouth at bedtime., Disp: , Rfl:    sodium chloride (OCEAN) 0.65 % SOLN nasal spray, Place 1 spray into both nostrils as needed for congestion., Disp: 30 mL, Rfl: 0   Exam: Current vital signs: BP (!) 121/8 (BP Location: Left Arm)    Pulse 85    Temp 98.7 F (37.1 C) (Oral)    Resp 16    Ht 5\' 6"  (1.676 m)    Wt 76 kg    SpO2 91%    BMI 27.04 kg/m  Vital signs in last 24 hours: Temp:  [98.7 F (37.1 C)] 98.7 F (37.1 C) (01/13 0516) Pulse Rate:  [85] 85 (01/13 0516) Resp:  [16] 16 (01/13 0516) BP: (121)/(8) 121/8 (01/13 0516) SpO2:  [91 %] 91 % (01/13 0516) Weight:  [76 kg] 76 kg (01/13 0516) General: Awake alert in no distress HNT: Normocephalic atraumatic Lungs: Clear CVS: Regular rate rhythm Abdomen nondistended nontender Extremities warm well perfused with decreased muscle mass in both lower extremities Neurological exam Awake alert oriented x3 Speech is not dysarthric No evidence of  aphasia Slow to respond to questions and diminished attention concentration Cranial nerves: Pupils equal round reactive to light, extraocular movements intact, visual fields appear full, facial sensation intact, face symmetric, tongue and palate midline. Motor examination with no drift in the upper extremities although she does have mild weakness in the left upper extremity proximally and distally compared to the right.  Both lower extremities have a vertical drift-she has antigravity strength in both but exhibits drift nearly symmetrically in both legs. Sensory exam: Intact to touch without extinction Coordination no dysmetria in the upper extremities.  Difficult to assess for lower extremities. NIH stroke scale 1a Level of  Conscious.: 0 1b LOC Questions: 0 1c LOC Commands: 0 2 Best Gaze: 0 3 Visual: 0 4 Facial Palsy: 0 5a Motor Arm - left: 0 5b Motor Arm - Right: 0 6a Motor Leg - Left: 1 6b Motor Leg - Right: 1 7 Limb Ataxia: 0 8 Sensory: 0 9 Best Language: 0 10 Dysarthria: 0 11 Extinct. and Inatten.: 0 TOTAL: 2   Labs I have reviewed labs in epic and the results pertinent to this consultation are:   CBC    Component Value Date/Time   WBC 10.5 08/15/2021 0046   RBC 3.16 (L) 08/15/2021 0046   HGB 9.3 (L) 08/15/2021 0046   HCT 30.7 (L) 08/15/2021 0046   PLT 223 08/15/2021 0046   MCV 97.2 08/15/2021 0046   MCH 29.4 08/15/2021 0046   MCHC 30.3 08/15/2021 0046   RDW 17.2 (H) 08/15/2021 0046   LYMPHSABS 4.1 (H) 08/06/2021 2225   MONOABS 1.3 (H) 08/06/2021 2225   EOSABS 0.2 08/06/2021 2225   BASOSABS 0.1 08/06/2021 2225    CMP     Component Value Date/Time   NA 136 08/14/2021 0034   NA 137 03/02/2019 1612   K 4.5 08/14/2021 0034   CL 102 08/14/2021 0034   CO2 24 08/14/2021 0034   GLUCOSE 116 (H) 08/14/2021 0034   BUN 15 08/14/2021 0034   BUN 8 03/02/2019 1612   CREATININE 0.56 08/14/2021 0034   CREATININE 0.80 01/08/2017 1449   CALCIUM 8.8 (L) 08/14/2021 0034    PROT 5.7 (L) 08/09/2021 0156   PROT 6.8 03/02/2019 1612   ALBUMIN 2.8 (L) 08/09/2021 0156   ALBUMIN 4.2 03/02/2019 1612   AST 23 08/09/2021 0156   ALT 25 08/09/2021 0156   ALKPHOS 85 08/09/2021 0156   BILITOT 0.5 08/09/2021 0156   BILITOT 0.2 03/02/2019 1612   GFRNONAA >60 08/14/2021 0034   GFRAA >60 02/17/2020 0453    Lipid Panel     Component Value Date/Time   CHOL 172 03/15/2021 0443   TRIG 162 (H) 07/19/2021 0327   HDL 67 03/15/2021 0443   CHOLHDL 2.6 03/15/2021 0443   VLDL 21 03/15/2021 0443   LDLCALC 84 03/15/2021 0443     Imaging I have reviewed the images obtained:  CT-head-chronic small vessel disease, aspects 10, no bleed MRI of the C-spine from July 2022 with chronic myelomalacia involving the dorsal cervical cord most pronounced at C3-4 stable from prior.  Multilevel DJD and cervical spondylosis with mild to moderate spinal stenosis at C4-5 through C6-7.  Multifactorial degenerative changes with resultant moderate to severe bilateral foraminal narrowing at C4-C5. MR thoracic spine from November 2022 with multiple remote compression fractures that have healed.  Degeneration most notable at L4-5 where there is anterior listhesis and mild bilateral foraminal narrowing.  Assessment: 76 year old with past medical history of HSV encephalomyelitis with progressive gait disorder, neurogenic bladder, progressive memory difficulties, anxiety, back pain, DVT on Eliquis brought in for evaluation of weakness with last known well at 10 PM. The weakness was more on the left compared to the right. On my exam she does have mild left upper extremity weakness without drift but both her legs are equally and symmetrically weak. At baseline, she has had progressive difficulty with memory and gait related to her prior encephalomyelitis. At this point, her CT head does not show a bleed-which was my primary concern given the history of her being on blood thinners. I cannot rule out a small  ischemic stroke-an MRI would be helpful to  evaluate for that. Other possibilities include weakness secondary to systemic infection such as UTI. Given the history of seizures-although no witnessed seizure, an EEG would be helpful to rule out subclinical event.  Impression: Multifactorial toxic metabolic encephalopathy Evaluate for stroke Evaluate for seizure   Recommendations: MRI of the brain without contrast MR C-spine without contrast Routine EEG Check urinalysis Checks chest x-ray Continue home Keppra We will follow the results with you.  Plan discussed with Dr. Tyrone Nine in the emergency room -- Amie Portland, MD Neurologist [facility] Pager: [phone]

## 2021-09-15 NOTE — ED Notes (Signed)
Patient transported to MRI 

## 2021-09-15 NOTE — Assessment & Plan Note (Signed)
-  I have discussed code status with the patient and her husband and  they are in agreement that the patient would not desire resuscitation and would prefer to die a natural death should that situation arise.

## 2021-09-15 NOTE — Assessment & Plan Note (Signed)
-  B PE and DVT during last hospitalization after Xarelto was stopped for epistaxis with prior hospitalization -Continue Eliquis -She needs lifelong anticoagulation

## 2021-09-15 NOTE — ED Provider Notes (Signed)
[place] EMERGENCY DEPARTMENT Provider Note   CSN: [id number] Arrival date & time: 09/15/21  0453  An emergency department physician performed an initial assessment on this suspected stroke patient at 0456.  History  Chief Complaint  Patient presents with   Code Stroke    Katelyn Lamb is a 76 y.o. female.  76 yo F with a cc of weakness.  Arrived as a code stroke.  Husband took her to the bathroom and was unable to get her up.  EMS felt left sided weakness. Lvl V caveat acuity of condition.   The history is provided by the patient.      Home Medications Prior to Admission medications   Medication Sig Start Date End Date Taking? Authorizing Provider  acyclovir (ZOVIRAX) 400 MG tablet TAKE 1 TABLET BY MOUTH TWICE A DAY Patient taking differently: Take 400 mg by mouth 2 (two) times daily. 03/27/21  Yes [name], MD  albuterol (PROVENTIL) (2.5 MG/3ML) 0.083% nebulizer solution Take 3 mLs (2.5 mg total) by nebulization every 2 (two) hours as needed for wheezing or shortness of breath. 08/15/21  Yes Ghimire, Henreitta Leber, MD  apixaban (ELIQUIS) 5 MG TABS tablet Take 1 tablet (5 mg total) by mouth 2 (two) times daily. 08/15/21  Yes Ghimire, Henreitta Leber, MD  Ascorbic Acid (VITAMIN C) 500 MG CHEW Chew 500 mg by mouth daily.   Yes [provider]  Calcium Carb-Cholecalciferol (CALCIUM 600 + D PO) Take 1 tablet by mouth in the morning and at bedtime.   Yes [provider]  Cyanocobalamin (VITAMIN B-12 PO) Take 500 mcg by mouth daily.   Yes [provider]  diazepam (VALIUM) 5 MG tablet Take 1 tablet (5 mg total) by mouth every 12 (twelve) hours as needed for anxiety or muscle spasms. For tremors 08/15/21  Yes Ghimire, Henreitta Leber, MD  docusate sodium (COLACE) 100 MG capsule Take 200 mg by mouth daily.   Yes [provider]  DULoxetine (CYMBALTA) 60 MG capsule Take 1 capsule (60 mg total) by mouth at bedtime. 08/01/21  Yes Oswald Hillock,  MD  feeding supplement (ENSURE ENLIVE / ENSURE PLUS) LIQD Take 237 mLs by mouth 2 (two) times daily between meals. 08/15/21  Yes Ghimire, Henreitta Leber, MD  ferrous sulfate 325 (65 FE) MG tablet Take 325 mg by mouth every morning. 03/10/21  Yes [provider]  guaiFENesin (MUCINEX) 600 MG 12 hr tablet Take 2 tablets (1,200 mg total) by mouth 2 (two) times daily. 08/15/21  Yes Ghimire, Henreitta Leber, MD  HYDROcodone-acetaminophen (NORCO) 10-325 MG tablet Take 1 tablet by mouth 3 (three) times daily as needed. 08/24/21  Yes [provider]  levETIRAcetam (KEPPRA) 250 MG tablet TAKE 1 TABLET BY MOUTH TWICE A DAY Patient taking differently: Take 250 mg by mouth 2 (two) times daily. 05/09/21  Yes [name], MD  loperamide (IMODIUM) 2 MG capsule Take 2 mg by mouth daily as needed for diarrhea or loose stools.   Yes [provider]  mirabegron ER (MYRBETRIQ) 50 MG TB24 tablet Take 50 mg by mouth at bedtime.   Yes [provider]  omega-3 acid ethyl esters (LOVAZA) 1 g capsule Take 1 g by mouth every morning.   Yes [provider]  pantoprazole (PROTONIX) 40 MG tablet Take 1 tablet (40 mg total) by mouth daily. 10/26/16  Yes Angiulli, Lavon Paganini, PA-C  predniSONE (DELTASONE) 5 MG tablet TAKE 1 TABLET BY MOUTH EVERY DAY WITH BREAKFAST  Patient taking differently: Take 5 mg by mouth daily with breakfast. 03/27/21  Yes [name], MD  pregabalin (LYRICA) 50 MG capsule Take 2 capsules (100 mg total) by mouth at bedtime. 02/09/21  Yes [name], MD  QUEtiapine (SEROQUEL) 100 MG tablet Take 100 mg by mouth at bedtime. 01/25/21  Yes [provider]  sodium chloride (OCEAN) 0.65 % SOLN nasal spray Place 1 spray into both nostrils as needed for congestion. 08/01/21  Yes Oswald Hillock, MD  benzonatate (TESSALON) 200 MG capsule Take 1 capsule (200 mg total) by mouth 3 (three) times daily. Patient not taking: Reported on 09/15/2021 08/15/21   Jonetta Osgood,  MD  DULoxetine (CYMBALTA) 30 MG capsule Take 30 mg by mouth in the morning. Patient not taking: Reported on 09/15/2021 03/17/21   [provider]  ipratropium-albuterol (DUONEB) 0.5-2.5 (3) MG/3ML SOLN Take 3 mLs by nebulization 3 (three) times daily. Patient not taking: Reported on 09/15/2021 08/15/21   Jonetta Osgood, MD  polyethylene glycol [facility] / Floria Raveling) packet Take 17 g by mouth daily. Patient not taking: Reported on 09/15/2021 10/26/16   Cathlyn Parsons, PA-C      Allergies    Demerol [meperidine], Percocet [oxycodone-acetaminophen], Amoxicillin-pot clavulanate, and Penicillins    Review of Systems   Review of Systems  Constitutional:  Negative for chills and fever.  HENT:  Negative for congestion and rhinorrhea.   Eyes:  Negative for redness and visual disturbance.  Respiratory:  Negative for shortness of breath and wheezing.   Cardiovascular:  Negative for chest pain and palpitations.  Gastrointestinal:  Negative for nausea and vomiting.  Genitourinary:  Negative for dysuria and urgency.  Musculoskeletal:  Negative for arthralgias and myalgias.  Skin:  Negative for pallor and wound.  Neurological:  Positive for weakness. Negative for dizziness and headaches.   Physical Exam Updated Vital Signs BP (!) 121/8 (BP Location: Left Arm)    Pulse 85    Temp 98.7 F (37.1 C) (Oral)    Resp 16    Ht 5\' 6"  (1.676 m)    Wt 76 kg    SpO2 98%    BMI 27.04 kg/m  Physical Exam Vitals and nursing note reviewed.  Constitutional:      General: She is not in acute distress.    Appearance: She is well-developed. She is not diaphoretic.  HENT:     Head: Normocephalic and atraumatic.  Eyes:     Pupils: Pupils are equal, round, and reactive to light.  Cardiovascular:     Rate and Rhythm: Normal rate and regular rhythm.     Heart sounds: No murmur heard.   No friction rub. No gallop.  Pulmonary:     Effort: Pulmonary effort is normal.     Breath sounds: No wheezing or  rales.  Abdominal:     General: There is no distension.     Palpations: Abdomen is soft.     Tenderness: There is no abdominal tenderness.  Musculoskeletal:        General: No tenderness.     Cervical back: Normal range of motion and neck supple.  Skin:    General: Skin is warm and dry.  Neurological:     Mental Status: She is alert and oriented to person, place, and time.  Psychiatric:        Behavior: Behavior normal.    ED Results / Procedures / Treatments   Labs (all labs ordered are listed, but only abnormal results  are displayed) Labs Reviewed  PROTIME-INR - Abnormal; Notable for the following components:      Result Value   Prothrombin Time 16.0 (*)    INR 1.3 (*)    All other components within normal limits  CBC - Abnormal; Notable for the following components:   Hemoglobin 11.1 (*)    MCHC 29.6 (*)    RDW 17.2 (*)    All other components within normal limits  DIFFERENTIAL - Abnormal; Notable for the following components:   Lymphs Abs 4.1 (*)    All other components within normal limits  COMPREHENSIVE METABOLIC PANEL - Abnormal; Notable for the following components:   Calcium 8.7 (*)    All other components within normal limits  RESP PANEL BY RT-PCR (FLU A&B, COVID) ARPGX2  ETHANOL  APTT  RAPID URINE DRUG SCREEN, HOSP PERFORMED  URINALYSIS, ROUTINE W REFLEX MICROSCOPIC  I-STAT CHEM 8, ED  CBG MONITORING, ED    EKG EKG Interpretation  Date/Time:  Friday September 15 2021 05:19:52 EST Ventricular Rate:  85 PR Interval:  164 QRS Duration: 87 QT Interval:  386 QTC Calculation: 459 R Axis:   84 Text Interpretation: Sinus rhythm Probable left atrial enlargement Low voltage with right axis deviation Consider anterior infarct No significant change since last tracing Confirmed by Deno Etienne [phone]) on 09/15/2021 5:44:41 AM  Radiology DG Chest Port 1 View  Result Date: 09/15/2021 CLINICAL DATA:  Weakness.  Code stroke EXAM: PORTABLE CHEST 1 VIEW COMPARISON:   08/15/2021 FINDINGS: Artifact from EKG leads. Normal heart size and mediastinal contours. There is no edema, consolidation, effusion, or pneumothorax. Osteopenia. IMPRESSION: Negative portable chest. Electronically Signed   By: Jorje Guild M.D.   On: 09/15/2021 06:03   CT HEAD CODE STROKE WO CONTRAST  Result Date: 09/15/2021 CLINICAL DATA:  Code stroke.  Neuro deficit EXAM: CT HEAD WITHOUT CONTRAST TECHNIQUE: Contiguous axial images were obtained from the base of the skull through the vertex without intravenous contrast. RADIATION DOSE REDUCTION: This exam was performed according to the departmental dose-optimization program which includes automated exposure control, adjustment of the mA and/or kV according to patient size and/or use of iterative reconstruction technique. COMPARISON:  07/14/2021 FINDINGS: Brain: No evidence of acute infarction, hemorrhage, hydrocephalus, extra-axial collection or mass lesion/mass effect. White matter low-density accentuating the bilateral frontal lobes where there is small areas of chronic cortical encephalomalacia. Vascular: No convincing hyperdense vessel. Skull: Normal. Negative for fracture or focal lesion. Sinuses/Orbits: No acute finding. Other: These results were communicated to Dr. Rory Percy at 5:16 am on 09/15/2021 by text page via the [facility] messaging system. ASPECTS [facility] Stroke Program Early CT Score) Not scored without localizing symptom IMPRESSION: No acute finding or change from November 2022. Electronically Signed   By: Jorje Guild M.D.   On: 09/15/2021 05:15    Procedures Procedures    Medications Ordered in ED Medications  acetaminophen (TYLENOL) tablet 650 mg (has no administration in time range)    Or  acetaminophen (TYLENOL) suppository 650 mg (has no administration in time range)    ED Course/ Medical Decision Making/ A&P                           Medical Decision Making  76 yo F with a cc of weakness.  Got worse this evening.  Was  unable to get up off the toilet then arrived here as a code stroke.  No obvious focal abnormality found on the neurologist neuro  exam.  I discussed the case with Dr. Rory Percy, neurology. Recommends MRI.  Blood work chest x-ray UA PT OT  No significant change from her baseline blood work.  No significant electrolyte abnormality.  No significant anemia.  CT of the head negative.  Discussed with medicine for admission.  The patients results and plan were reviewed and discussed.   Any x-rays performed were independently reviewed by myself.   Differential diagnosis were considered with the presenting HPI.  Medications  acetaminophen (TYLENOL) tablet 650 mg (has no administration in time range)    Or  acetaminophen (TYLENOL) suppository 650 mg (has no administration in time range)    Vitals:   09/15/21 0516 09/15/21 0623  BP: (!) 121/8   Pulse: 85   Resp: 16   Temp: 98.7 F (37.1 C)   TempSrc: Oral   SpO2: 91% 98%  Weight: 76 kg   Height: 5\' 6"  (1.676 m)     Final diagnoses:  Left-sided weakness    Admission/ observation were discussed with the admitting physician, patient and/or family and they are comfortable with the plan.          Final Clinical Impression(s) / ED Diagnoses Final diagnoses:  Left-sided weakness    Rx / DC Orders ED Discharge Orders     None         Deno Etienne, DO 09/15/21 [phone]

## 2021-09-15 NOTE — Assessment & Plan Note (Signed)
-  Her husband reported BUE weakness followed by her left arm not staying on the bedside commode arm rail -CT negative -MRI brain unremarkable -MRI s-cpine appears unchanged but I have asked Dr. Kathyrn Sheriff to review to ensure that no intervention is needed -PT/OT consults -Will observe on telemetry for now

## 2021-09-15 NOTE — Progress Notes (Addendum)
Carryover to Day Admitter.  I discussed this patient's case with the EDP, Dr. Tyrone Nine.  Per these discussions:  This is a 76 year old female who is being admitted for further evaluation management of left upper extremity weakness.  Patient reportedly awoke earlier this morning to use the bathroom, at which time she noted left upper extremity weakness, which is reportedly new for her.  This resolved prior to her arrival in the ED. EDP discussed patient's case with on-call neurologist, Dr. Rory Percy, Who recommended admission to the hospitalist service for further evaluation management of left upper extremity weakness, including reported specific recommendations for MRI brain, EEG, and physical therapy, with formal neurology consult to follow.  I have placed an order for observation to med telemetry.  I have also placed some preliminary admission orders via the adult multi morbid order set.  Also ordered every 4 hours neurochecks. Of note, based upon 2 most recent documented code statuses, I have also listed patient as DNR.     Babs Bertin, DO Hospitalist

## 2021-09-15 NOTE — H&P (Signed)
History and Physical    Patient: Katelyn Lamb:[id number] DOB: [birth year] DOA: 09/15/2021 DOS: the patient was seen and examined on 09/15/2021 PCP: [name], MD  Patient coming from: Home - lives with husband; NOK: Husband, [phone]   Chief Complaint: L > RUE weakness  HPI: Katelyn Lamb is a 76 y.o. female with medical history significant of DVT; dementia; VTE; seizure d/o; HSV encephalomyelitis; and neurogenic bladder presenting with weakness.   They were here in November for generalized LE weakness, unable to stand.  She is getting therapy at home for this.  This AM about 3am, she developed acute pain in her R buttocks.  She felt the need to go to the bathroom and realized that she had LUE > RUE weakness.  Her L arm would fall off the armrest of the bedside commode.  The other arm was better.  She is consistently not oriented to the year.  She is sometimes disoriented upon awakening.    She was hospitalized from 11/11-30 for UTI; she has a chronic indwelling suprapubic catheter from viral encephalomyelitis/transverse myelitis about 5 years ago.  She had weakness and difficulty ambulating at the time of admission but ID saw her and though she had colonization rather than infection and antibiotics were stopped.  While she was hospitalized she had epistaxis with Rhino Rocket placement and KCentra and was intubated for embolization of the sphenopalatine artery; she was extubated the following day.  Xarelto was stopped and she was discharged but returned from 12/4-13 with B PE and RLE DVT.  She was treated with heparin -> Eliquis.  She was also diagnosed with HCAP and treated with Vanc/Cefepime -> Omnicef.  She was discharged to SNF rehab and remained there until 12/23.  They have recently hired in-home assistance.  She has required [place] O2 since coming off the ventilator in November, 1L most recently.  They try to not use it during the day but is still requiring nocturnal O2.      ER Course:   Carryover, per Dr. Velia Meyer:  Left upper extremity weakness.  Patient reportedly awoke earlier this morning to use the bathroom, at which time she noted left upper extremity weakness, which is reportedly new for her.  This resolved prior to her arrival in the ED.  Neurology will see, suggests MRI, EEG.     Review of Systems: Unable to review all systems due to lack of cooperation from patient. Past Medical History:  Diagnosis Date   Acute deep vein thrombosis (DVT) of popliteal vein of left lower extremity (HCC)    Anxiety    Back pain    Chronic kidney disease    Encephalomyelitis    Gait abnormality 11/14/2016   Memory difficulty 03/02/2019   Myelitis due to herpes simplex [facility])    Neurogenic bladder    Past Surgical History:  Procedure Laterality Date   ABDOMINAL HYSTERECTOMY     BLADDER REPAIR     CESAREAN SECTION     IR ANGIO EXTERNAL CAROTID SEL EXT CAROTID BILAT MOD SED  07/18/2021   IR ANGIO INTRA EXTRACRAN SEL INTERNAL CAROTID BILAT MOD SED  07/18/2021   IR KYPHO LUMBAR INC FX REDUCE BONE BX UNI/BIL CANNULATION INC/IMAGING  04/11/2018   IR NEURO EACH ADD'L AFTER BASIC UNI LEFT (MS)  07/18/2021   IR NEURO EACH ADD'L AFTER BASIC UNI RIGHT (MS)  07/18/2021   IR TRANSCATH/EMBOLIZ  07/18/2021   IR US GUIDE VASC ACCESS RIGHT  07/18/2021   RADIOLOGY WITH ANESTHESIA  N/A 07/18/2021   Procedure: IR WITH ANESTHESIA;  Surgeon: Pedro Earls, MD;  Location: [place];  Service: Radiology;  Laterality: N/A;   TUBAL LIGATION     Social History:  reports that she has quit smoking. Her smoking use included cigarettes. She has a 40.00 pack-year smoking history. She has never used smokeless tobacco. She reports that she does not drink alcohol and does not use drugs.  Allergies  Allergen Reactions   Demerol [Meperidine] Other (See Comments)    Hallucinations   Percocet [Oxycodone-Acetaminophen] Itching   Amoxicillin-Pot Clavulanate Diarrhea    Severe pain, headache,  intestinal infection   Penicillins Itching and Rash    Tolerated amoxicillin November 2022  Has patient had a PCN reaction causing immediate rash, facial/tongue/throat swelling, SOB or lightheadedness with hypotension:  NO Has patient had a PCN reaction causing severe rash involving mucus membranes or skin necrosis: No Has patient had a PCN reaction that required hospitalization: No Has patient had a PCN reaction occurring within the last 10 years: Yes If all of the above answers are "NO", then may proceed with Cephalosporin use.    Family History  Problem Relation Age of Onset   Hypertension Mother     Prior to Admission medications   Medication Sig Start Date End Date Taking? Authorizing Provider  acyclovir (ZOVIRAX) 400 MG tablet TAKE 1 TABLET BY MOUTH TWICE A DAY Patient taking differently: Take 400 mg by mouth 2 (two) times daily. 03/27/21  Yes [name], MD  albuterol (PROVENTIL) (2.5 MG/3ML) 0.083% nebulizer solution Take 3 mLs (2.5 mg total) by nebulization every 2 (two) hours as needed for wheezing or shortness of breath. 08/15/21  Yes Ghimire, Henreitta Leber, MD  apixaban (ELIQUIS) 5 MG TABS tablet Take 1 tablet (5 mg total) by mouth 2 (two) times daily. 08/15/21  Yes Ghimire, Henreitta Leber, MD  Ascorbic Acid (VITAMIN C) 500 MG CHEW Chew 500 mg by mouth daily.   Yes [provider]  Calcium Carb-Cholecalciferol (CALCIUM 600 + D PO) Take 1 tablet by mouth in the morning and at bedtime.   Yes [provider]  Cyanocobalamin (VITAMIN B-12 PO) Take 500 mcg by mouth daily.   Yes [provider]  diazepam (VALIUM) 5 MG tablet Take 1 tablet (5 mg total) by mouth every 12 (twelve) hours as needed for anxiety or muscle spasms. For tremors Patient taking differently: Take 5 mg by mouth every 12 (twelve) hours as needed (tremors). 08/15/21  Yes Ghimire, Henreitta Leber, MD  docusate sodium (COLACE) 100 MG capsule Take 200 mg by mouth daily as needed for mild constipation.    Yes [provider]  DULoxetine (CYMBALTA) 60 MG capsule Take 1 capsule (60 mg total) by mouth at bedtime. 08/01/21  Yes Oswald Hillock, MD  feeding supplement (ENSURE ENLIVE / ENSURE PLUS) LIQD Take 237 mLs by mouth 2 (two) times daily between meals. 08/15/21  Yes Ghimire, Henreitta Leber, MD  ferrous sulfate 325 (65 FE) MG tablet Take 325 mg by mouth every morning. 03/10/21  Yes [provider]  guaiFENesin (MUCINEX) 600 MG 12 hr tablet Take 2 tablets (1,200 mg total) by mouth 2 (two) times daily. 08/15/21  Yes Ghimire, Henreitta Leber, MD  HYDROcodone-acetaminophen (NORCO) 10-325 MG tablet Take 1 tablet by mouth 3 (three) times daily as needed. 08/24/21  Yes [provider]  levETIRAcetam (KEPPRA) 250 MG tablet TAKE 1 TABLET BY MOUTH TWICE A DAY Patient taking differently: Take 250 mg by mouth  2 (two) times daily. 05/09/21  Yes [name], MD  loperamide (IMODIUM) 2 MG capsule Take 2 mg by mouth daily as needed for diarrhea or loose stools.   Yes [provider]  mirabegron ER (MYRBETRIQ) 50 MG TB24 tablet Take 50 mg by mouth at bedtime.   Yes [provider]  omega-3 acid ethyl esters (LOVAZA) 1 g capsule Take 1 g by mouth every morning.   Yes [provider]  pantoprazole (PROTONIX) 40 MG tablet Take 1 tablet (40 mg total) by mouth daily. 10/26/16  Yes Angiulli, Lavon Paganini, PA-C  predniSONE (DELTASONE) 5 MG tablet TAKE 1 TABLET BY MOUTH EVERY DAY WITH BREAKFAST Patient taking differently: Take 5 mg by mouth daily with breakfast. 03/27/21  Yes [name], MD  pregabalin (LYRICA) 50 MG capsule Take 2 capsules (100 mg total) by mouth at bedtime. Patient taking differently: Take 150 mg by mouth at bedtime. 02/09/21  Yes [name], MD  QUEtiapine (SEROQUEL) 100 MG tablet Take 100 mg by mouth at bedtime. 01/25/21  Yes [provider]  sodium chloride (OCEAN) 0.65 % SOLN nasal spray Place 1 spray into both nostrils as needed for congestion.  08/01/21  Yes Oswald Hillock, MD  benzonatate (TESSALON) 200 MG capsule Take 1 capsule (200 mg total) by mouth 3 (three) times daily. Patient not taking: Reported on 09/15/2021 08/15/21   Jonetta Osgood, MD  DULoxetine (CYMBALTA) 30 MG capsule Take 30 mg by mouth in the morning. Patient not taking: Reported on 09/15/2021 03/17/21   [provider]  ipratropium-albuterol (DUONEB) 0.5-2.5 (3) MG/3ML SOLN Take 3 mLs by nebulization 3 (three) times daily. Patient not taking: Reported on 09/15/2021 08/15/21   Jonetta Osgood, MD  polyethylene glycol [facility] / Floria Raveling) packet Take 17 g by mouth daily. Patient not taking: Reported on 09/15/2021 10/26/16   Cathlyn Parsons, PA-C    Physical Exam: Vitals:   09/15/21 1351 09/15/21 1500 09/15/21 1600 09/15/21 1630  BP: 98/63 100/65 (!) 88/54 107/73  Pulse: 77 78 76 72  Resp: 13 16 16 14   Temp:      TempSrc:      SpO2: 100% 97% 97% 97%  Weight:      Height:       General:  Appears calm and comfortable and is in NAD, minimally engaged and most of history is from her husband Eyes:  PERRL, EOMI, normal lids, iris ENT:  grossly normal hearing, lips & tongue, dry mm Neck:  no LAD, masses or thyromegaly Cardiovascular:  RRR, no m/r/g. 1+ LE edema.  Respiratory:   CTA bilaterally with no wheezes/rales/rhonchi.  Normal respiratory effort. Abdomen:  soft, NT, ND, +suprapubic catheter in place Skin:  no rash or induration seen on limited exam Musculoskeletal:  decreased tone BUE/BLE, appears symmetric,  no bony abnormality Psychiatric:  blunted mood and affect, speech sparse but appropriate, AOx2 Neurologic:  CN 2-12 grossly intact, generalized weakness that is hard to localize   Radiological Exams on Admission: Independently reviewed - see discussion in A/P where applicable  MR BRAIN WO CONTRAST  Result Date: 09/15/2021 CLINICAL DATA:  Stroke, follow-up, history of HSVencephalomyelitis EXAM: MRI HEAD WITHOUT CONTRAST MRI CERVICAL  SPINE WITHOUT CONTRAST TECHNIQUE: Multiplanar, multiecho pulse sequences of the brain and surrounding structures were obtained without intravenous contrast. Multiplanar, multisequence MR imaging of the cervical spine was performed. No intravenous contrast was administered. COMPARISON:  03/15/2021 MRI head, correlation is also made with CT head 09/15/2021 02/01/2020 MRI  cervical spine, correlation is also made with 07/14/2021 CT cervical spine FINDINGS: HEAD FINDINGS: Brain: No restricted diffusion to suggest acute or subacute infarct. No acute hemorrhage, mass, mass effect, or midline shift. No hydrocephalus or extra-axial collection. Unchanged ventricular prominence, which is commensurate with parenchymal volume loss. Confluent T2 hyperintense signal in the periventricular white matter and pons, likely the sequela of moderate to severe chronic small vessel ischemic disease, unchanged from the prior exam no foci. Of hemosiderin deposition to suggest remote hemorrhage. Vascular: Normal flow voids. Skull and upper cervical spine: Normal marrow signal. Sinuses/Orbits: Negative.  Status post bilateral lens replacements. Other: Fluid throughout the right mastoid air cells. Trace fluid in the left mastoid air cells. CERVICAL SPINE FINDINGS Evaluation is somewhat limited by motion artifact. Alignment: Unchanged reversal of the normal cervical lordosis and trace retrolisthesis of C6 on C7. Vertebrae: No acute fracture or suspicious osseous lesion. Redemonstrated hemangioma in the T2 vertebral body. Cord: Redemonstrated increased T2 signal and spinal cord atrophy involving the central/dorsal aspect of the cord at C3-C4, unchanged, with additional mild patchy increased T2 signal extending more inferiorly into the thoracic spinal cord, likely related to the patient's history of transverse myelitis. No new areas of increased T2 signal in the spinal cord. Posterior Fossa, vertebral arteries, paraspinal tissues: Negative. Disc  levels: C2-C3: Small central disc protrusion. Facet arthropathy. No spinal canal stenosis. Mild right neural foraminal narrowing, which appears to progressed from prior exam. C3-C4: Disc height loss and disc osteophyte complex. No spinal canal stenosis. Uncovertebral and facet arthropathy. Mild bilateral neural foraminal narrowing. C4-C5: Disc height loss with diffuse disc osteophyte complex. Facet and uncovertebral hypertrophy. Mild spinal canal stenosis, unchanged. Moderate bilateral neural foraminal narrowing, unchanged. C5-C6: Disc height loss with moderate disc bulge. Facet and uncovertebral hypertrophy. This level is particularly motion degraded, but appears to have unchanged moderate spinal canal stenosis and severe bilateral neural foraminal narrowing. C6-C7: Disc height loss and disc osteophyte complex. Uncovertebral and facet arthropathy. Mild spinal canal stenosis. Moderate bilateral neural foraminal narrowing, unchanged. C7-T1: No significant disc bulge. Uncovertebral and facet arthropathy. No spinal canal stenosis or neural foraminal narrowing. IMPRESSION: 1. No acute intracranial process. No significant change from the prior exam. 2. Unchanged chronic myelomalacia involving the cervical spinal cord, most pronounced at C3-C4, which is presumed to be related to the patient's history of transverse myelitis. No new areas of abnormal signal. 3. Redemonstrated multilevel degenerative changes, worst at C5-C6, where there is moderate spinal canal stenosis and severe bilateral neural foraminal narrowing. These degenerative findings are largely unchanged compared to 02/01/2020, with the exception of the right neural foramen at C2-C3, which appears mildly narrowed. Electronically Signed   By: Merilyn Baba M.D.   On: 09/15/2021 11:50   MR CERVICAL SPINE WO CONTRAST  Result Date: 09/15/2021 CLINICAL DATA:  Stroke, follow-up, history of HSVencephalomyelitis EXAM: MRI HEAD WITHOUT CONTRAST MRI CERVICAL SPINE  WITHOUT CONTRAST TECHNIQUE: Multiplanar, multiecho pulse sequences of the brain and surrounding structures were obtained without intravenous contrast. Multiplanar, multisequence MR imaging of the cervical spine was performed. No intravenous contrast was administered. COMPARISON:  03/15/2021 MRI head, correlation is also made with CT head 09/15/2021 02/01/2020 MRI cervical spine, correlation is also made with 07/14/2021 CT cervical spine FINDINGS: HEAD FINDINGS: Brain: No restricted diffusion to suggest acute or subacute infarct. No acute hemorrhage, mass, mass effect, or midline shift. No hydrocephalus or extra-axial collection. Unchanged ventricular prominence, which is commensurate with parenchymal volume loss. Confluent T2 hyperintense signal in the periventricular white  matter and pons, likely the sequela of moderate to severe chronic small vessel ischemic disease, unchanged from the prior exam no foci. Of hemosiderin deposition to suggest remote hemorrhage. Vascular: Normal flow voids. Skull and upper cervical spine: Normal marrow signal. Sinuses/Orbits: Negative.  Status post bilateral lens replacements. Other: Fluid throughout the right mastoid air cells. Trace fluid in the left mastoid air cells. CERVICAL SPINE FINDINGS Evaluation is somewhat limited by motion artifact. Alignment: Unchanged reversal of the normal cervical lordosis and trace retrolisthesis of C6 on C7. Vertebrae: No acute fracture or suspicious osseous lesion. Redemonstrated hemangioma in the T2 vertebral body. Cord: Redemonstrated increased T2 signal and spinal cord atrophy involving the central/dorsal aspect of the cord at C3-C4, unchanged, with additional mild patchy increased T2 signal extending more inferiorly into the thoracic spinal cord, likely related to the patient's history of transverse myelitis. No new areas of increased T2 signal in the spinal cord. Posterior Fossa, vertebral arteries, paraspinal tissues: Negative. Disc levels:  C2-C3: Small central disc protrusion. Facet arthropathy. No spinal canal stenosis. Mild right neural foraminal narrowing, which appears to progressed from prior exam. C3-C4: Disc height loss and disc osteophyte complex. No spinal canal stenosis. Uncovertebral and facet arthropathy. Mild bilateral neural foraminal narrowing. C4-C5: Disc height loss with diffuse disc osteophyte complex. Facet and uncovertebral hypertrophy. Mild spinal canal stenosis, unchanged. Moderate bilateral neural foraminal narrowing, unchanged. C5-C6: Disc height loss with moderate disc bulge. Facet and uncovertebral hypertrophy. This level is particularly motion degraded, but appears to have unchanged moderate spinal canal stenosis and severe bilateral neural foraminal narrowing. C6-C7: Disc height loss and disc osteophyte complex. Uncovertebral and facet arthropathy. Mild spinal canal stenosis. Moderate bilateral neural foraminal narrowing, unchanged. C7-T1: No significant disc bulge. Uncovertebral and facet arthropathy. No spinal canal stenosis or neural foraminal narrowing. IMPRESSION: 1. No acute intracranial process. No significant change from the prior exam. 2. Unchanged chronic myelomalacia involving the cervical spinal cord, most pronounced at C3-C4, which is presumed to be related to the patient's history of transverse myelitis. No new areas of abnormal signal. 3. Redemonstrated multilevel degenerative changes, worst at C5-C6, where there is moderate spinal canal stenosis and severe bilateral neural foraminal narrowing. These degenerative findings are largely unchanged compared to 02/01/2020, with the exception of the right neural foramen at C2-C3, which appears mildly narrowed. Electronically Signed   By: Merilyn Baba M.D.   On: 09/15/2021 11:50   DG Chest Port 1 View  Result Date: 09/15/2021 CLINICAL DATA:  Weakness.  Code stroke EXAM: PORTABLE CHEST 1 VIEW COMPARISON:  08/15/2021 FINDINGS: Artifact from EKG leads. Normal heart  size and mediastinal contours. There is no edema, consolidation, effusion, or pneumothorax. Osteopenia. IMPRESSION: Negative portable chest. Electronically Signed   By: Jorje Guild M.D.   On: 09/15/2021 06:03   EEG adult  Result Date: 09/15/2021 Lora Havens, MD     09/15/2021 12:45 PM Patient Name: ADANYA SOSINSKI MRN: [id number] Epilepsy Attending: Lora Havens Referring Physician/Provider: Karmen Bongo, MD Date: 09/15/2021 Duration: 26.22 mins Patient history: 76 year old with past medical history of HSV encephalomyelitis with progressive gait disorder, neurogenic bladder, progressive memory difficulties, anxiety, back pain, DVT on Eliquis brought in for evaluation of weakness. EEG to evaluate for seizure Level of alertness: Awake, asleep AEDs during EEG study: LEV, Pregabalin Technical aspects: This EEG study was done with scalp electrodes positioned according to the 10-20 International system of electrode placement. Electrical activity was acquired at a sampling rate of 500Hz  and reviewed with a high  frequency filter of 70Hz  and a low frequency filter of 1Hz . EEG data were recorded continuously and digitally stored. Description: The posterior dominant rhythm consists of 7.5-8 Hz activity of moderate voltage (25-35 uV) seen predominantly in posterior head regions, symmetric and reactive to eye opening and eye closing.  Sleep was characterized by sleep spindles (12 to 14 Hz), maximal frontocentral region. Physiologic photic driving was not seen during photic stimulation.  Hyperventilation was not performed.   IMPRESSION: This study is within normal limits. No seizures or epileptiform discharges were seen throughout the recording. Lora Havens   CT HEAD CODE STROKE WO CONTRAST  Result Date: 09/15/2021 CLINICAL DATA:  Code stroke.  Neuro deficit EXAM: CT HEAD WITHOUT CONTRAST TECHNIQUE: Contiguous axial images were obtained from the base of the skull through the vertex without intravenous  contrast. RADIATION DOSE REDUCTION: This exam was performed according to the departmental dose-optimization program which includes automated exposure control, adjustment of the mA and/or kV according to patient size and/or use of iterative reconstruction technique. COMPARISON:  07/14/2021 FINDINGS: Brain: No evidence of acute infarction, hemorrhage, hydrocephalus, extra-axial collection or mass lesion/mass effect. White matter low-density accentuating the bilateral frontal lobes where there is small areas of chronic cortical encephalomalacia. Vascular: No convincing hyperdense vessel. Skull: Normal. Negative for fracture or focal lesion. Sinuses/Orbits: No acute finding. Other: These results were communicated to Dr. Rory Percy at 5:16 am on 09/15/2021 by text page via the [facility] messaging system. ASPECTS [facility] Stroke Program Early CT Score) Not scored without localizing symptom IMPRESSION: No acute finding or change from November 2022. Electronically Signed   By: Jorje Guild M.D.   On: 09/15/2021 05:15    EKG: Independently reviewed.  NSR with rate 85; nonspecific ST changes with NSCSLT   Labs on Admission: I have personally reviewed the available labs and imaging studies at the time of the admission.  Pertinent labs:    Unremarkable BMP Unremarkable CBC INR 1.3 COVID/flu negative ETOH <10   Assessment/Plan * Weakness of left upper extremity- (present on admission) -Her husband reported BUE weakness followed by her left arm not staying on the bedside commode arm rail -CT negative -MRI brain unremarkable -MRI s-cpine appears unchanged but I have asked Dr. Kathyrn Sheriff to review to ensure that no intervention is needed -PT/OT consults -Will observe on telemetry for now  Incomplete paraplegia [facility])- (present on admission) -She has chronic generalized weakness and did not appear to have focal findings at the time of admission today -It is unclear how much of her presentation is acute vs.  Chronic -Her husband recently hired help at home, ?need for placement -PT/OT consults requested  History of pulmonary embolism- (present on admission) -B PE and DVT during last hospitalization after Xarelto was stopped for epistaxis with prior hospitalization -Continue Eliquis -She needs lifelong anticoagulation  Encephalomyelitis- (present on admission) -h/o remote infection with chronic debility -She continues to take BID acyclovir  Chronic suprapubic catheter (HCC) -No evidence of infection at this time -UA is pending -Continue Myrbetriq  DNR (do not resuscitate)- (present on admission) -I have discussed code status with the patient and her husband and  they are in agreement that the patient would not desire resuscitation and would prefer to die a natural death should that situation arise.  Chronic pain syndrome- (present on admission) -She has chronic debility and chronic pain -She is not on Keppra for seizures - denies h/o seizures - but rather for pain control according to family -Continue Lyrica (tapering off), Keppra -Continue  Norco -Continue Cymbalta -Continue prednisone -I have reviewed this patient in the [place] Controlled Substances Reporting System.  She is receiving medications from two providers but appears to be taking them as prescribed. -She is not at particularly high risk of opioid misuse, diversion, or overdose.  Anxiety about health- (present on admission) -Continue Cymbalta, Seroquel, Valium    Advance Care Planning:   Code Status: DNR   Consults: Neurology; PT/OT; TOC team; nutrition  Family Communication: Husband was present throughout evaluation  Severity of Illness: The appropriate patient status for this patient is OBSERVATION. Observation status is judged to be reasonable and necessary in order to provide the required intensity of service to ensure the patient's safety. The patient's presenting symptoms, physical exam findings, and initial  radiographic and laboratory data in the context of their medical condition is felt to place them at decreased risk for further clinical deterioration. Furthermore, it is anticipated that the patient will be medically stable for discharge from the hospital within 2 midnights of admission.   Author: Karmen Bongo, MD 09/15/2021 4:55 PM  For on call review [email or web address].

## 2021-09-15 NOTE — Assessment & Plan Note (Addendum)
-  No evidence of infection at this time -UA is pending -Continue Myrbetriq

## 2021-09-15 NOTE — Progress Notes (Signed)
EEG complete - results pending 

## 2021-09-15 NOTE — Code Documentation (Signed)
Responded to Code Stroke called at 0433 for L arm droop, [id number]. Pt arrived at 0453, CBG-93,NIH-2 for BLE weakness, CT neg for acute changes. Plan-MRI.

## 2021-09-15 NOTE — Procedures (Signed)
Patient Name: Katelyn Lamb  MRN: [id number]  Epilepsy Attending: Lora Havens  Referring Physician/Provider: Karmen Bongo, MD Date: 09/15/2021 Duration: 26.22 mins  Patient history: 76 year old with past medical history of HSV encephalomyelitis with progressive gait disorder, neurogenic bladder, progressive memory difficulties, anxiety, back pain, DVT on Eliquis brought in for evaluation of weakness. EEG to evaluate for seizure  Level of alertness: Awake, asleep  AEDs during EEG study: LEV, Pregabalin  Technical aspects: This EEG study was done with scalp electrodes positioned according to the 10-20 International system of electrode placement. Electrical activity was acquired at a sampling rate of 500Hz  and reviewed with a high frequency filter of 70Hz  and a low frequency filter of 1Hz . EEG data were recorded continuously and digitally stored.   Description: The posterior dominant rhythm consists of 7.5-8 Hz activity of moderate voltage (25-35 uV) seen predominantly in posterior head regions, symmetric and reactive to eye opening and eye closing.  Sleep was characterized by sleep spindles (12 to 14 Hz), maximal frontocentral region. Physiologic photic driving was not seen during photic stimulation.  Hyperventilation was not performed.     IMPRESSION: This study is within normal limits. No seizures or epileptiform discharges were seen throughout the recording.  Lenita Peregrina Barbra Sarks

## 2021-09-15 NOTE — Progress Notes (Signed)
Called regarding MRI findings. I have reviewed the EMR and discussed this case with Dr. Lorin Mercy. Pt admitted with weakness, has extensive medical history including HSV encephalomyelitis/ ? Transverse myelitis, PE. I reviewed her MRI Cspine which is motion degraded but demonstrates multilevel cervical spondylosis including up to moderate stenosis at C5-6 and various levels of foraminal stenosis. There is also chronic encephalomalacia in the upper cervical spine likely a result of her known hx of myelitis. I do not see any need for acute neurosurgical intervention. Furthermore, given her baseline condition I suspect that even in the future the risks of surgery will likely never be outweighed by the benefits.   Consuella Lose, MD [facility]

## 2021-09-15 NOTE — ED Triage Notes (Signed)
BIB [facility] from home. Called by husband due to lt side weakness. Pt was taken to restroom and husband noted lt side weakness. A&Ox4, virus in spine, has mobility issues, foley in place on arrival

## 2021-09-16 DIAGNOSIS — G9589 Other specified diseases of spinal cord: Secondary | ICD-10-CM | POA: Diagnosis not present

## 2021-09-16 DIAGNOSIS — R29898 Other symptoms and signs involving the musculoskeletal system: Secondary | ICD-10-CM | POA: Diagnosis not present

## 2021-09-16 LAB — LIPID PANEL
Cholesterol: 150 mg/dL (ref 0–200)
HDL: 48 mg/dL (ref >40)
LDL Cholesterol: 76 mg/dL (ref 0–99)
Total CHOL/HDL Ratio: 3.1 RATIO
Triglycerides: 130 mg/dL (ref <150)
VLDL: 26 mg/dL (ref 0–40)

## 2021-09-16 MED ORDER — MIDODRINE HCL 5 MG PO TABS
5.0000 mg | ORAL_TABLET | Freq: Three times a day (TID) | ORAL | Status: DC
  Administered 2021-09-16 – 2021-09-17 (×4): 5 mg via ORAL
  Filled 2021-09-16 (×4): qty 1

## 2021-09-16 NOTE — Evaluation (Signed)
Physical Therapy Evaluation Patient Details Name: Katelyn Lamb MRN: [id number] DOB: Feb 24, 1946 Today's Date: 09/16/2021  History of Present Illness  76 y.o. female with medical history significant of DVT; dementia; VTE; seizure d/o; HSV encephalomyelitis; and neurogenic bladder presenting with weakness.  Clinical Impression  Pt admitted with the above complications. Spoke with husband over the phone and her functional abilities apparently fluctuate daily requiring up to max assist for bed mobility and transfers at times. Pt currently at Mod assist level for bed mobility and transfers, demonstrating a heavy lean towards Rt side and posteriorly. Demonstrates decreased control with functional tasks using LUE, however husband reports notable improvement last night PTA. He currently feels they are managing her care adequately at home with HHPT/OT and aides. Pt currently with functional limitations due to the deficits listed below (see PT Problem List). Pt will benefit from skilled PT to increase their independence and safety with mobility to allow discharge to the venue listed below.          Recommendations for follow up therapy are one component of a multi-disciplinary discharge planning process, led by the attending physician.  Recommendations may be updated based on patient status, additional functional criteria and insurance authorization.  Follow Up Recommendations Home health PT    Assistance Recommended at Discharge Frequent or constant Supervision/Assistance  Patient can return home with the following  A lot of help with bathing/dressing/bathroom;Help with stairs or ramp for entrance;Assist for transportation (Consider PTAR if no ramp)    Equipment Recommendations None recommended by PT  Recommendations for Other Services  OT consult    Functional Status Assessment Patient has had a recent decline in their functional status and/or demonstrates limited ability to make significant  improvements in function in a reasonable and predictable amount of time     Precautions / Restrictions Precautions Precautions: Fall Restrictions Weight Bearing Restrictions: No      Mobility  Bed Mobility Overal bed mobility: Needs Assistance Bed Mobility: Supine to Sit;Sit to Supine     Supine to sit: Mod assist Sit to supine: Min assist   General bed mobility comments: Mod assist for rotating and facilitating trunk to EOB, mod assist to scoot to EOB. Min assist for LE support back into bed. VC to sequencing - slow, difficulty figuring out technique.    Transfers Overall transfer level: Needs assistance Equipment used: Rolling walker (2 wheels) Transfers: Sit to/from Stand Sit to Stand: Mod assist           General transfer comment: Mod assist for boost to stand, demonstrates heavy lean towards Rt side and posteriorly - pt reports baseline. practiced x2, performed side stepping alone bed with mod assist for balance holding RW. No buckling but unable to stand without assistance.    Ambulation/Gait                  Stairs            Wheelchair Mobility    Modified Rankin (Stroke Patients Only) Modified Rankin (Stroke Patients Only) Pre-Morbid Rankin Score: Severe disability Modified Rankin: Severe disability     Balance Overall balance assessment: Needs assistance Sitting-balance support: Feet supported;No upper extremity supported Sitting balance-Leahy Scale: Fair   Postural control: Right lateral lean;Posterior lean Standing balance support: Bilateral upper extremity supported Standing balance-Leahy Scale: Poor                               Pertinent  Vitals/Pain Pain Assessment: No/denies pain    Home Living Family/patient expects to be discharged to:: Private residence Living Arrangements: Spouse/significant other Available Help at Discharge: Available 24 hours/day;Family Type of Home: House Home Access: Stairs to  enter Entrance Stairs-Rails: None Entrance Stairs-Number of Steps: 3 Alternate Level Stairs-Number of Steps: 10 Home Layout: Able to live on main level with bedroom/bathroom;Two level Home Equipment: Tub bench;Rolling Walker (2 wheels);BSC/3in1;Grab bars - tub/shower;Wheelchair - manual      Prior Function Prior Level of Function : Needs assist  Cognitive Assist : ADLs (cognitive)   ADLs (Cognitive): Step by step cues Physical Assist : Mobility (physical);ADLs (physical) Mobility (physical): Bed mobility;Transfers;Gait   Mobility Comments: Pt states she has not been walking, requires assist to transfer to w/c and toilet       Hand Dominance   Dominant Hand: Right    Extremity/Trunk Assessment   Upper Extremity Assessment Upper Extremity Assessment: Defer to OT evaluation    Lower Extremity Assessment Lower Extremity Assessment: Generalized weakness       Communication   Communication: No difficulties  Cognition Arousal/Alertness: Awake/alert Behavior During Therapy: WFL for tasks assessed/performed Overall Cognitive Status: Impaired/Different from baseline Area of Impairment: Orientation;Problem solving                 Orientation Level: Disoriented to;Time;Situation (wrong month and year)           Problem Solving: Slow processing;Decreased initiation;Difficulty sequencing;Requires verbal cues;Requires tactile cues          General Comments General comments (skin integrity, edema, etc.): Spoke with husband - Reports she has good and bad days requiring up to max assist on the difficult days - he feels confident managing at home with the aides and HHPT; acknowledges the fluctuation in functional ability.    Exercises     Assessment/Plan    PT Assessment Patient needs continued PT services  PT Problem List Decreased strength;Decreased range of motion;Decreased activity tolerance;Decreased balance;Decreased mobility;Decreased cognition;Decreased  coordination;Decreased knowledge of use of DME;Decreased safety awareness       PT Treatment Interventions DME instruction;Functional mobility training;Therapeutic activities;Therapeutic exercise;Balance training;Neuromuscular re-education;Cognitive remediation;Patient/family education    PT Goals (Current goals can be found in the Care Plan section)  Acute Rehab PT Goals Patient Stated Goal: go home PT Goal Formulation: With family Time For Goal Achievement: 09/30/21 Potential to Achieve Goals: Good    Frequency Min 3X/week     Co-evaluation               AM-PAC PT "6 Clicks" Mobility  Outcome Measure Help needed turning from your back to your side while in a flat bed without using bedrails?: A Little Help needed moving from lying on your back to sitting on the side of a flat bed without using bedrails?: A Lot Help needed moving to and from a bed to a chair (including a wheelchair)?: A Lot Help needed standing up from a chair using your arms (e.g., wheelchair or bedside chair)?: A Lot Help needed to walk in hospital room?: Total Help needed climbing 3-5 steps with a railing? : Total 6 Click Score: 11    End of Session Equipment Utilized During Treatment: Gait belt;Oxygen Activity Tolerance: Patient tolerated treatment well Patient left: in bed;with call bell/phone within reach;with bed alarm set   PT Visit Diagnosis: Unsteadiness on feet (R26.81);Muscle weakness (generalized) (M62.81);Difficulty in walking, not elsewhere classified (R26.2)    Time: 0160-1093 PT Time Calculation (min) (ACUTE ONLY): 41 min   Charges:  PT Evaluation $PT Eval Moderate Complexity: 1 Mod PT Treatments $Therapeutic Activity: 23-37 mins        Elayne Snare, PT, DPT  Ellouise Newer 09/16/2021, 9:45 AM

## 2021-09-16 NOTE — Progress Notes (Signed)
PROGRESS NOTE    DELAYLA HOFFMASTER  LXB:[id number] DOB: 11/28/1945 DOA: 09/15/2021 PCP: [name], MD   Chief Complain:weakness  Brief Narrative: Katelyn Lamb is a 76 y.o. female with medical history significant of DVT; dementia; VTE; seizure d/o; HSV encephalomyelitis; transverse mellitus, and neurogenic bladder presenting with weakness.  She noted to have worsening of weakness of her left upper extremity.  Patient was admitted for further work-up.  EEG was negative for seizures.  Neurology consulted.  MRI of the brain/MRI of the cervical spine did not show any acute findings but showed chronic multilevel cervical spondylosis, very stable foraminal stenosis, chronic encephalomalacia in the left upper cervical spine.  Neurosurgery was also consulted, no plan for intervention.  PT/OT recommend home health on discharge.  Neurology also following, no plan for further work-up.  PT/OT recommended home health but husband states she cannot take her home today because of persistent weakness.  Possible discharge home tomorrow  Assessment & Plan:   Principal Problem:   Weakness of left upper extremity Active Problems:   Encephalomyelitis   Incomplete paraplegia (HCC)   Anxiety about health   Chronic pain syndrome   DNR (do not resuscitate)   Chronic suprapubic catheter (HCC)   History of pulmonary embolism   Weakness of left upper extremity- (present on admission) -Her husband reported BUE weakness followed by her left arm not staying on the bedside commode arm rail -CT negative -MRI brain unremarkable -MRI s-cpine appears unchanged but I have asked Dr. Kathyrn Sheriff to review to ensure that no intervention is needed -PT/OT consults,home health recommended   Incomplete paraplegia ([place])- (present on admission) -She has chronic generalized weakness and did not appear to have focal findings at the time of admission -PT/OT recommend home health.   History of pulmonary embolism- (present on  admission) -B PE and DVT during last hospitalization after Xarelto was stopped for epistaxis with prior hospitalization -Continue Eliquis -She needs lifelong anticoagulation   Encephalomyelitis- (present on admission) -h/o remote infection with chronic debility -She continues to take BID acyclovir   Chronic suprapubic catheter (HCC) -No evidence of infection at this time -Continue Myrbetriq -UA not suggestive of infection   DNR (do not resuscitate)- (present on admission) -I have discussed code status with the patient and her husband and  they are in agreement that the patient would not desire resuscitation and would prefer to die a natural death should that situation arise.   Chronic pain syndrome- (present on admission) -She has chronic debility and chronic pain -She is not on Keppra for seizures - denies h/o seizures - but rather for pain control according to family -Continue Lyrica (tapering off), Keppra -Continue Norco -Continue Cymbalta -Continue prednisone -She is not at particularly high risk of opioid misuse, diversion, or overdose.   Anxiety about health- (present on admission) -Continue Cymbalta, Seroquel, Valium   Soft blood pressure Chronic.  She is asymptomatic without any dizziness or lightheadedness.  Continue gentle IV fluids overnight.  Started on midodrine 5 mg 3 times a day .           DVT prophylaxis:Eliquis Code Status: DNR Family Communication: Husband at bedside Patient status:Obs  Dispo: The patient is from: Home              Anticipated d/c is to: Home              Anticipated d/c date is: tomorrow  Consultants: Neurology, neurosurgery  Procedures: None  Antimicrobials:  Anti-infectives (From admission, onward)  Start     Dose/Rate Route Frequency Ordered Stop   09/15/21 1000  acyclovir (ZOVIRAX) tablet 400 mg        400 mg Oral 2 times daily 09/15/21 0957         Subjective: Patient seen and examined at the bedside this  morning.  Blood pressure was low but she is asymptomatic.  Husband at the bedside.  She mentions that she is felt better since yesterday and her weakness on the left upper extremity is close to her baseline.  Patient worked with PT and OT, continues to have problem with mobility  Objective: Vitals:   09/15/21 1949 09/15/21 2336 09/16/21 0321 09/16/21 0739  BP: 97/63 (!) 92/57 (!) 91/59 94/68  Pulse: 75 86 84 84  Resp: 16 17 16 14   Temp: (!) 97.5 F (36.4 C) 98 F (36.7 C) 98.4 F (36.9 C) 97.7 F (36.5 C)  TempSrc: Oral Oral Oral Oral  SpO2: 97% 97% 98% 96%  Weight:      Height:        Intake/Output Summary (Last 24 hours) at 09/16/2021 1349 Last data filed at 09/16/2021 0610 Gross per 24 hour  Intake 265.88 ml  Output 900 ml  Net -634.12 ml   Filed Weights   09/15/21 0516  Weight: 76 kg    Examination:  General exam: Not in distress, very deconditioned, chronically ill looking HEENT: PERRL Respiratory system:  no wheezes or crackles  Cardiovascular system: S1 & S2 heard, RRR.  Gastrointestinal system: Abdomen is nondistended, soft and nontender. Central nervous system: Alert and oriented, generalized weakness Extremities: No edema, no clubbing ,no cyanosis Skin: No rashes, no ulcers,no icterus      Data Reviewed: I have personally reviewed following labs and imaging studies  CBC: Recent Labs  Lab 09/15/21 0503 09/15/21 0523  WBC 10.2  --   NEUTROABS 5.0  --   HGB 11.1* 12.6  HCT 37.5 37.0  MCV 96.9  --   PLT 249  --    Basic Metabolic Panel: Recent Labs  Lab 09/15/21 0503 09/15/21 0523  NA 136 139  K 4.0 4.1  CL 102 101  CO2 27  --   GLUCOSE 90 87  BUN 17 19  CREATININE 0.82 0.80  CALCIUM 8.7*  --    GFR: Estimated Creatinine Clearance: 65.3 mL/min (by C-G formula based on SCr of 0.8 mg/dL). Liver Function Tests: Recent Labs  Lab 09/15/21 0503  AST 28  ALT 25  ALKPHOS 68  BILITOT 0.5  PROT 6.6  ALBUMIN 3.5   No results for input(s):  LIPASE, AMYLASE in the last 168 hours. No results for input(s): AMMONIA in the last 168 hours. Coagulation Profile: Recent Labs  Lab 09/15/21 0503  INR 1.3*   Cardiac Enzymes: No results for input(s): CKTOTAL, CKMB, CKMBINDEX, TROPONINI in the last 168 hours. BNP (last 3 results) No results for input(s): PROBNP in the last 8760 hours. HbA1C: No results for input(s): HGBA1C in the last 72 hours. CBG: Recent Labs  Lab 09/15/21 0459  GLUCAP 93   Lipid Profile: Recent Labs    09/16/21 0235  CHOL 150  HDL 48  LDLCALC 76  TRIG 130  CHOLHDL 3.1   Thyroid Function Tests: No results for input(s): TSH, T4TOTAL, FREET4, T3FREE, THYROIDAB in the last 72 hours. Anemia Panel: No results for input(s): VITAMINB12, FOLATE, FERRITIN, TIBC, IRON, RETICCTPCT in the last 72 hours. Sepsis Labs: No results for input(s): PROCALCITON, LATICACIDVEN in the last 168 hours.  Recent Results (from the past 240 hour(s))  Resp Panel by RT-PCR (Flu A&B, Covid) Nasopharyngeal Swab     Status: None   Collection Time: 09/15/21  4:57 AM   Specimen: Nasopharyngeal Swab; Nasopharyngeal(NP) swabs in vial transport medium  Result Value Ref Range Status   SARS Coronavirus 2 by RT PCR NEGATIVE NEGATIVE Final    Comment: (NOTE) SARS-CoV-2 target nucleic acids are NOT DETECTED.  The SARS-CoV-2 RNA is generally detectable in upper respiratory specimens during the acute phase of infection. The lowest concentration of SARS-CoV-2 viral copies this assay can detect is 138 copies/mL. A negative result does not preclude SARS-Cov-2 infection and should not be used as the sole basis for treatment or other patient management decisions. A negative result may occur with  improper specimen collection/handling, submission of specimen other than nasopharyngeal swab, presence of viral mutation(s) within the areas targeted by this assay, and inadequate number of viral copies(<138 copies/mL). A negative result must be  combined with clinical observations, patient history, and epidemiological information. The expected result is Negative.  Fact Sheet for Patients:  EntrepreneurPulse.com.au  Fact Sheet for Healthcare Providers:  IncredibleEmployment.be  This test is no t yet approved or cleared by the [place] FDA and  has been authorized for detection and/or diagnosis of SARS-CoV-2 by FDA under an Emergency Use Authorization (EUA). This EUA will remain  in effect (meaning this test can be used) for the duration of the COVID-19 declaration under Section 564(b)(1) of the Act, 21 U.S.C.section 360bbb-3(b)(1), unless the authorization is terminated  or revoked sooner.       Influenza A by PCR NEGATIVE NEGATIVE Final   Influenza B by PCR NEGATIVE NEGATIVE Final    Comment: (NOTE) The Xpert Xpress SARS-CoV-2/FLU/RSV plus assay is intended as an aid in the diagnosis of influenza from Nasopharyngeal swab specimens and should not be used as a sole basis for treatment. Nasal washings and aspirates are unacceptable for Xpert Xpress SARS-CoV-2/FLU/RSV testing.  Fact Sheet for Patients: EntrepreneurPulse.com.au  Fact Sheet for Healthcare Providers: IncredibleEmployment.be  This test is not yet approved or cleared by the [place] FDA and has been authorized for detection and/or diagnosis of SARS-CoV-2 by FDA under an Emergency Use Authorization (EUA). This EUA will remain in effect (meaning this test can be used) for the duration of the COVID-19 declaration under Section 564(b)(1) of the Act, 21 U.S.C. section 360bbb-3(b)(1), unless the authorization is terminated or revoked.  Performed at [facility], [place] [address] [place], [place] [address]          Radiology Studies: MR BRAIN WO CONTRAST  Result Date: 09/15/2021 CLINICAL DATA:  Stroke, follow-up, history of HSVencephalomyelitis EXAM: MRI HEAD WITHOUT  CONTRAST MRI CERVICAL SPINE WITHOUT CONTRAST TECHNIQUE: Multiplanar, multiecho pulse sequences of the brain and surrounding structures were obtained without intravenous contrast. Multiplanar, multisequence MR imaging of the cervical spine was performed. No intravenous contrast was administered. COMPARISON:  03/15/2021 MRI head, correlation is also made with CT head 09/15/2021 02/01/2020 MRI cervical spine, correlation is also made with 07/14/2021 CT cervical spine FINDINGS: HEAD FINDINGS: Brain: No restricted diffusion to suggest acute or subacute infarct. No acute hemorrhage, mass, mass effect, or midline shift. No hydrocephalus or extra-axial collection. Unchanged ventricular prominence, which is commensurate with parenchymal volume loss. Confluent T2 hyperintense signal in the periventricular white matter and pons, likely the sequela of moderate to severe chronic small vessel ischemic disease, unchanged from the prior exam no foci. Of hemosiderin deposition to suggest remote  hemorrhage. Vascular: Normal flow voids. Skull and upper cervical spine: Normal marrow signal. Sinuses/Orbits: Negative.  Status post bilateral lens replacements. Other: Fluid throughout the right mastoid air cells. Trace fluid in the left mastoid air cells. CERVICAL SPINE FINDINGS Evaluation is somewhat limited by motion artifact. Alignment: Unchanged reversal of the normal cervical lordosis and trace retrolisthesis of C6 on C7. Vertebrae: No acute fracture or suspicious osseous lesion. Redemonstrated hemangioma in the T2 vertebral body. Cord: Redemonstrated increased T2 signal and spinal cord atrophy involving the central/dorsal aspect of the cord at C3-C4, unchanged, with additional mild patchy increased T2 signal extending more inferiorly into the thoracic spinal cord, likely related to the patient's history of transverse myelitis. No new areas of increased T2 signal in the spinal cord. Posterior Fossa, vertebral arteries, paraspinal  tissues: Negative. Disc levels: C2-C3: Small central disc protrusion. Facet arthropathy. No spinal canal stenosis. Mild right neural foraminal narrowing, which appears to progressed from prior exam. C3-C4: Disc height loss and disc osteophyte complex. No spinal canal stenosis. Uncovertebral and facet arthropathy. Mild bilateral neural foraminal narrowing. C4-C5: Disc height loss with diffuse disc osteophyte complex. Facet and uncovertebral hypertrophy. Mild spinal canal stenosis, unchanged. Moderate bilateral neural foraminal narrowing, unchanged. C5-C6: Disc height loss with moderate disc bulge. Facet and uncovertebral hypertrophy. This level is particularly motion degraded, but appears to have unchanged moderate spinal canal stenosis and severe bilateral neural foraminal narrowing. C6-C7: Disc height loss and disc osteophyte complex. Uncovertebral and facet arthropathy. Mild spinal canal stenosis. Moderate bilateral neural foraminal narrowing, unchanged. C7-T1: No significant disc bulge. Uncovertebral and facet arthropathy. No spinal canal stenosis or neural foraminal narrowing. IMPRESSION: 1. No acute intracranial process. No significant change from the prior exam. 2. Unchanged chronic myelomalacia involving the cervical spinal cord, most pronounced at C3-C4, which is presumed to be related to the patient's history of transverse myelitis. No new areas of abnormal signal. 3. Redemonstrated multilevel degenerative changes, worst at C5-C6, where there is moderate spinal canal stenosis and severe bilateral neural foraminal narrowing. These degenerative findings are largely unchanged compared to 02/01/2020, with the exception of the right neural foramen at C2-C3, which appears mildly narrowed. Electronically Signed   By: Merilyn Baba M.D.   On: 09/15/2021 11:50   MR CERVICAL SPINE WO CONTRAST  Result Date: 09/15/2021 CLINICAL DATA:  Stroke, follow-up, history of HSVencephalomyelitis EXAM: MRI HEAD WITHOUT  CONTRAST MRI CERVICAL SPINE WITHOUT CONTRAST TECHNIQUE: Multiplanar, multiecho pulse sequences of the brain and surrounding structures were obtained without intravenous contrast. Multiplanar, multisequence MR imaging of the cervical spine was performed. No intravenous contrast was administered. COMPARISON:  03/15/2021 MRI head, correlation is also made with CT head 09/15/2021 02/01/2020 MRI cervical spine, correlation is also made with 07/14/2021 CT cervical spine FINDINGS: HEAD FINDINGS: Brain: No restricted diffusion to suggest acute or subacute infarct. No acute hemorrhage, mass, mass effect, or midline shift. No hydrocephalus or extra-axial collection. Unchanged ventricular prominence, which is commensurate with parenchymal volume loss. Confluent T2 hyperintense signal in the periventricular white matter and pons, likely the sequela of moderate to severe chronic small vessel ischemic disease, unchanged from the prior exam no foci. Of hemosiderin deposition to suggest remote hemorrhage. Vascular: Normal flow voids. Skull and upper cervical spine: Normal marrow signal. Sinuses/Orbits: Negative.  Status post bilateral lens replacements. Other: Fluid throughout the right mastoid air cells. Trace fluid in the left mastoid air cells. CERVICAL SPINE FINDINGS Evaluation is somewhat limited by motion artifact. Alignment: Unchanged reversal of the normal cervical lordosis and  trace retrolisthesis of C6 on C7. Vertebrae: No acute fracture or suspicious osseous lesion. Redemonstrated hemangioma in the T2 vertebral body. Cord: Redemonstrated increased T2 signal and spinal cord atrophy involving the central/dorsal aspect of the cord at C3-C4, unchanged, with additional mild patchy increased T2 signal extending more inferiorly into the thoracic spinal cord, likely related to the patient's history of transverse myelitis. No new areas of increased T2 signal in the spinal cord. Posterior Fossa, vertebral arteries, paraspinal  tissues: Negative. Disc levels: C2-C3: Small central disc protrusion. Facet arthropathy. No spinal canal stenosis. Mild right neural foraminal narrowing, which appears to progressed from prior exam. C3-C4: Disc height loss and disc osteophyte complex. No spinal canal stenosis. Uncovertebral and facet arthropathy. Mild bilateral neural foraminal narrowing. C4-C5: Disc height loss with diffuse disc osteophyte complex. Facet and uncovertebral hypertrophy. Mild spinal canal stenosis, unchanged. Moderate bilateral neural foraminal narrowing, unchanged. C5-C6: Disc height loss with moderate disc bulge. Facet and uncovertebral hypertrophy. This level is particularly motion degraded, but appears to have unchanged moderate spinal canal stenosis and severe bilateral neural foraminal narrowing. C6-C7: Disc height loss and disc osteophyte complex. Uncovertebral and facet arthropathy. Mild spinal canal stenosis. Moderate bilateral neural foraminal narrowing, unchanged. C7-T1: No significant disc bulge. Uncovertebral and facet arthropathy. No spinal canal stenosis or neural foraminal narrowing. IMPRESSION: 1. No acute intracranial process. No significant change from the prior exam. 2. Unchanged chronic myelomalacia involving the cervical spinal cord, most pronounced at C3-C4, which is presumed to be related to the patient's history of transverse myelitis. No new areas of abnormal signal. 3. Redemonstrated multilevel degenerative changes, worst at C5-C6, where there is moderate spinal canal stenosis and severe bilateral neural foraminal narrowing. These degenerative findings are largely unchanged compared to 02/01/2020, with the exception of the right neural foramen at C2-C3, which appears mildly narrowed. Electronically Signed   By: Merilyn Baba M.D.   On: 09/15/2021 11:50   DG Chest Port 1 View  Result Date: 09/15/2021 CLINICAL DATA:  Weakness.  Code stroke EXAM: PORTABLE CHEST 1 VIEW COMPARISON:  08/15/2021 FINDINGS:  Artifact from EKG leads. Normal heart size and mediastinal contours. There is no edema, consolidation, effusion, or pneumothorax. Osteopenia. IMPRESSION: Negative portable chest. Electronically Signed   By: Jorje Guild M.D.   On: 09/15/2021 06:03   EEG adult  Result Date: 09/15/2021 Lora Havens, MD     09/15/2021 12:45 PM Patient Name: JAMAYAH MYSZKA MRN: [id number] Epilepsy Attending: Lora Havens Referring Physician/Provider: Karmen Bongo, MD Date: 09/15/2021 Duration: 26.22 mins Patient history: 76 year old with past medical history of HSV encephalomyelitis with progressive gait disorder, neurogenic bladder, progressive memory difficulties, anxiety, back pain, DVT on Eliquis brought in for evaluation of weakness. EEG to evaluate for seizure Level of alertness: Awake, asleep AEDs during EEG study: LEV, Pregabalin Technical aspects: This EEG study was done with scalp electrodes positioned according to the 10-20 International system of electrode placement. Electrical activity was acquired at a sampling rate of 500Hz  and reviewed with a high frequency filter of 70Hz  and a low frequency filter of 1Hz . EEG data were recorded continuously and digitally stored. Description: The posterior dominant rhythm consists of 7.5-8 Hz activity of moderate voltage (25-35 uV) seen predominantly in posterior head regions, symmetric and reactive to eye opening and eye closing.  Sleep was characterized by sleep spindles (12 to 14 Hz), maximal frontocentral region. Physiologic photic driving was not seen during photic stimulation.  Hyperventilation was not performed.   IMPRESSION: This study is within  normal limits. No seizures or epileptiform discharges were seen throughout the recording. Lora Havens   CT HEAD CODE STROKE WO CONTRAST  Result Date: 09/15/2021 CLINICAL DATA:  Code stroke.  Neuro deficit EXAM: CT HEAD WITHOUT CONTRAST TECHNIQUE: Contiguous axial images were obtained from the base of the skull  through the vertex without intravenous contrast. RADIATION DOSE REDUCTION: This exam was performed according to the departmental dose-optimization program which includes automated exposure control, adjustment of the mA and/or kV according to patient size and/or use of iterative reconstruction technique. COMPARISON:  07/14/2021 FINDINGS: Brain: No evidence of acute infarction, hemorrhage, hydrocephalus, extra-axial collection or mass lesion/mass effect. White matter low-density accentuating the bilateral frontal lobes where there is small areas of chronic cortical encephalomalacia. Vascular: No convincing hyperdense vessel. Skull: Normal. Negative for fracture or focal lesion. Sinuses/Orbits: No acute finding. Other: These results were communicated to Dr. Rory Percy at 5:16 am on 09/15/2021 by text page via the [facility] messaging system. ASPECTS [facility] Stroke Program Early CT Score) Not scored without localizing symptom IMPRESSION: No acute finding or change from November 2022. Electronically Signed   By: Jorje Guild M.D.   On: 09/15/2021 05:15        Scheduled Meds:  acyclovir  400 mg Oral BID   apixaban  5 mg Oral BID   DULoxetine  60 mg Oral QHS   levETIRAcetam  250 mg Oral BID   midodrine  5 mg Oral TID WC   mirabegron ER  50 mg Oral QHS   omega-3 acid ethyl esters  1 g Oral q morning   pantoprazole  40 mg Oral Daily   predniSONE  5 mg Oral Q breakfast   pregabalin  150 mg Oral QHS   QUEtiapine  100 mg Oral QHS   Continuous Infusions:  sodium chloride Stopped (09/15/21 1657)     LOS: 0 days    Time spent: More than 50% of that time was spent in counseling and/or coordination of care.      Shelly Coss, MD [facility] P1/14/2023, 1:49 PM

## 2021-09-16 NOTE — Progress Notes (Signed)
SLP Cancellation Note  Patient Details Name: Katelyn Lamb MRN: [id number] DOB: August 13, 1946   Cancelled treatment:       Reason Eval/Treat Not Completed: SLP screened, MRI negative, spoke with her husband at bedside, no needs identified, will sign off   Juan Quam Laurice 09/16/2021, 2:53 PM

## 2021-09-16 NOTE — Plan of Care (Signed)

## 2021-09-16 NOTE — Evaluation (Addendum)
Occupational Therapy Evaluation Patient Details Name: Katelyn Lamb MRN: [id number] DOB: [birth year] Today's Date: 09/16/2021   History of Present Illness 76 y.o. female with medical history significant of DVT; dementia; VTE; seizure d/o; HSV encephalomyelitis; and neurogenic bladder presenting with weakness.   Clinical Impression   Katelyn Lamb required assist for transfers and ADLs PTA. Her husband states that her functional ability fluctuates from needing min A to max A with all aspects of her care, she is limited to [facility] txs and sponge bathing due to their bathroom size and she has a hospital bed at home. Some days pt is able to walk, others she is limited to stand pivot surface transfers. They live in a 1 level home with a ramped entrance. Pt has a home health aide Monday-Friday 8a-5p. Upon evaluation pt required mod A for bed mobility, and mod A for sitting balance. Pt's husband expressed concern with his ability to assist pt with safe transfers. Husband educated on transfer technique and they practiced, he was able to assist pt with 3x sit<>stands with max A. He was unable to stand pivot pt 3x. Ultimately pt required max-total A squat pivot from this therapist. Due to poor sitting balance, weakness and poor activity tolerance pt is limited to bed level ADLs with min A-total A required. Pt will benefit from continued OT acutely to progress function, reduce caregiver burden and increase safety. Pt and pt's husband declining SNF at this time. Recommend home with Katelyn Lamb; to progress to home safety pt and her husband should demonstrate mod-max A stand pivot surface transfer. At pt's current level, she is a high fall and safety risk.      Recommendations for follow up therapy are one component of a multi-disciplinary discharge planning process, led by the attending physician.  Recommendations may be updated based on patient status, additional functional criteria and insurance authorization.   Follow Up  Recommendations  Home health OT (HHOT if pt and family are abel to safely complete SP transfers. Consider SNF if pt is unable to transfer with mod A +1.)    Assistance Recommended at Discharge Frequent or constant Supervision/Assistance  Patient can return home with the following A lot of help with walking and/or transfers;A lot of help with bathing/dressing/bathroom;Assistance with feeding;Assist for transportation;Help with stairs or ramp for entrance    Functional Status Assessment  Patient has had a recent decline in their functional status and demonstrates the ability to make significant improvements in function in a reasonable and predictable amount of time.  Equipment Recommendations  Other (comment) Harrel Lemon)    Recommendations for Other Services       Precautions / Restrictions Precautions Precautions: Fall Restrictions Weight Bearing Restrictions: No      Mobility Bed Mobility Overal bed mobility: Needs Assistance Bed Mobility: Supine to Sit     Supine to sit: Mod assist Sit to supine: Min assist   General bed mobility comments: requires assist for BLE and trunk elevation, also mod A for sitting EOB balance    Transfers Overall transfer level: Needs assistance Equipment used: 1 person hand held assist;2 person hand held assist Transfers: Sit to/from Stand;Bed to chair/wheelchair/BSC Sit to Stand: Max assist   Squat pivot transfers: Max assist;Total assist       General transfer comment: max A for 3x sit<>stand with husband. 1x wtih therapist. Husband unable to assist pt in bed>chair transfer this date. Pt ultimately required max-total A for squat pivot from bed>chair via this therapist  Balance Overall balance assessment: Needs assistance Sitting-balance support: Feet supported Sitting balance-Leahy Scale: Poor Sitting balance - Comments: pt requried mod A for sitting balance. pt with L lateral lean this session Postural control: Left lateral  lean Standing balance support: Bilateral upper extremity supported Standing balance-Leahy Scale: Poor                             ADL either performed or assessed with clinical judgement   ADL Overall ADL's : Needs assistance/impaired Eating/Feeding: Set up;Sitting   Grooming: Set up;Sitting   Upper Body Bathing: Maximal assistance;Sitting   Lower Body Bathing: Maximal assistance;Bed level   Upper Body Dressing : Moderate assistance;Sitting   Lower Body Dressing: Maximal assistance;Bed level   Toilet Transfer: Maximal assistance;+2 for physical assistance;Stand-pivot   Toileting- Clothing Manipulation and Hygiene: Total assistance;Bed level Toileting - Clothing Manipulation Details (indicate cue type and reason): suprapubic catheter. total A for rear peri care     Functional mobility during ADLs: Maximal assistance;+2 for safety/equipment;+2 for physical assistance (limited to sit<>stand and squat pivot) General ADL Comments: Pt is limited by poor sitting balance with L lateral lean & requires mod A for midline posture, BUE and BLE weakness. She required max A fir sit<>stand and max/total A for squat pivot from bed>chair. Due to heavy assist levels pt is bed level for ADLs this date     Vision Baseline Vision/History: 1 Wears glasses Ability to See in Adequate Light: 0 Adequate Vision Assessment?: No apparent visual deficits     Perception     Praxis      Pertinent Vitals/Pain Pain Assessment: No/denies pain     Hand Dominance Right   Extremity/Trunk Assessment Upper Extremity Assessment Upper Extremity Assessment: RUE deficits/detail;LUE deficits/detail RUE Deficits / Details: globally 3/5 MMT RUE Sensation: decreased light touch RUE Coordination: decreased fine motor LUE Deficits / Details: globally 3-/5 MMT, limited overhead ROM LUE Sensation: decreased light touch LUE Coordination: decreased fine motor;decreased gross motor   Lower Extremity  Assessment Lower Extremity Assessment: Generalized weakness   Cervical / Trunk Assessment Cervical / Trunk Assessment: Kyphotic   Communication Communication Communication: No difficulties   Cognition Arousal/Alertness: Awake/alert Behavior During Therapy: WFL for tasks assessed/performed Overall Cognitive Status: Impaired/Different from baseline Area of Impairment: Following commands;Safety/judgement;Awareness;Problem solving                 Orientation Level: Disoriented to;Time;Situation     Following Commands: Follows one step commands with increased time Safety/Judgement: Decreased awareness of deficits;Decreased awareness of safety Awareness: Emergent Problem Solving: Slow processing;Decreased initiation;Requires verbal cues General Comments: Likely close to her baseilne     General Comments  Spent significantly incrased time with pt and her husband educating on transfers and practicing simple stand-pivot transfers. After 3 unsuccessful attempts pt's husband reports he does not feel confident in safely caring for pt at home at her current functional level. Messaged MD and care team.    Exercises     Shoulder Instructions      Home Living Family/patient expects to be discharged to:: Private residence Living Arrangements: Spouse/significant other Available Help at Discharge: Available 24 hours/day;Family Type of Home: House Home Access: Ramped entrance Entrance Stairs-Number of Steps: 3 Entrance Stairs-Rails: None Home Layout: Able to live on main level with bedroom/bathroom;Two level Alternate Level Stairs-Number of Steps: 10   Bathroom Shower/Tub: Teacher, early years/pre: Standard Bathroom Accessibility: Yes   Home Equipment: Tub bench;Rolling Environmental consultant (2  wheels);BSC/3in1;Grab bars - tub/shower;Wheelchair - manual;Hospital bed          Prior Functioning/Environment Prior Level of Function : Needs assist  Cognitive Assist : ADLs (cognitive)    ADLs (Cognitive): Step by step cues Physical Assist : Mobility (physical);ADLs (physical) Mobility (physical): Bed mobility;Transfers;Gait   Mobility Comments: Pt states she has not been walking, requires assist to transfer to w/c and toilet ADLs Comments: prior to last hospital stay required some assist for ADLs, since able to self feed some but requires assist for other ADL's        OT Problem List: Decreased strength;Decreased range of motion;Impaired balance (sitting and/or standing);Decreased activity tolerance;Decreased safety awareness;Decreased knowledge of use of DME or AE      OT Treatment/Interventions: Self-care/ADL training;Therapeutic exercise;Therapeutic activities;Patient/family education;Balance training;DME and/or AE instruction    OT Goals(Current goals can be found in the care plan section) Acute Rehab OT Goals Patient Stated Goal: home OT Goal Formulation: With patient Time For Goal Achievement: 09/30/21 Potential to Achieve Goals: Good ADL Goals Pt Will Perform Lower Body Dressing: with mod assist;sit to/from stand Pt Will Transfer to Toilet: with mod assist;stand pivot transfer;bedside commode Additional ADL Goal #1: pt will complete bed mobility independently as a precursor to ADLs  OT Frequency: Min 2X/week       AM-PAC OT "6 Clicks" Daily Activity     Outcome Measure Help from another person eating meals?: A Little Help from another person taking care of personal grooming?: A Little Help from another person toileting, which includes using toliet, bedpan, or urinal?: A Lot Help from another person bathing (including washing, rinsing, drying)?: A Lot Help from another person to put on and taking off regular upper body clothing?: A Lot Help from another person to put on and taking off regular lower body clothing?: A Lot 6 Click Score: 14   End of Session Equipment Utilized During Treatment: Gait belt;Left knee immobilizer Nurse Communication: Mobility  status (Plan to have pt's husband assist with transfer back to bed wtih help of RN or NT)  Activity Tolerance: Patient limited by fatigue Patient left: in chair;with call bell/phone within reach;with family/visitor present  OT Visit Diagnosis: Other abnormalities of gait and mobility (R26.89);Muscle weakness (generalized) (M62.81)                Time: 3845-3646 OT Time Calculation (min): 66 min Charges:  OT General Charges $OT Visit: 1 Visit OT Evaluation $OT Eval Moderate Complexity: 1 Mod OT Treatments $Therapeutic Activity: 38-52 mins   Sabreen Kitchen A Iridian Reader 09/16/2021, 1:07 PM

## 2021-09-16 NOTE — Progress Notes (Signed)
Neurology Progress Note  Brief HPI: Patient with a history of HSV encephalitis with progressive gait disorder, neurogenic bladder, seizure disorder, progressive memory difficulties, anxiety and DVT presents with worsening weakness of the left arm and leg.  Subjective: Patient reports that she has been feeling more weakness in her left arm than normal, as well as weakness in bilateral legs.  Her husband reports that her baseline strength fluctuates, and that she sometimes needs quite a lot of assistance and sometimes is able to stand independently.  After admission, a code stroke was called, but patient was found to have no LVO on CTA and no acute infarct on MRI.  Patient's husband states that he is unable to care for her at home with her current level of weakness.  Exam: Vitals:   09/16/21 0321 09/16/21 0739  BP: (!) 91/59 94/68  Pulse: 84 84  Resp: 16 14  Temp: 98.4 F (36.9 C) 97.7 F (36.5 C)  SpO2: 98% 96%   Gen: In bed, NAD Resp: non-labored breathing, no acute distress Abd: soft, nt  Neuro:  Mental Status: Patient is alert and oriented x3, speech is fluent without dysarthria or aphasia Cranial Nerves:PERRL, EOMI, facial sensation and movement symmetrical, phonation normal, tongue midline Motor: 4/5 strength in all extremities, tone and bulk are normal Sensory:intact to light touch bilaterally DTR: 2+ triceps and brachioradialis, unable to elicit patellar Gait: Deferred  Pertinent Labs: CBC    Component Value Date/Time   WBC 10.2 09/15/2021 0503   RBC 3.87 09/15/2021 0503   HGB 12.6 09/15/2021 0523   HCT 37.0 09/15/2021 0523   PLT 249 09/15/2021 0503   MCV 96.9 09/15/2021 0503   MCH 28.7 09/15/2021 0503   MCHC 29.6 (L) 09/15/2021 0503   RDW 17.2 (H) 09/15/2021 0503   LYMPHSABS 4.1 (H) 09/15/2021 0503   MONOABS 0.8 09/15/2021 0503   EOSABS 0.2 09/15/2021 0503   BASOSABS 0.0 09/15/2021 0503  CMP     Component Value Date/Time   NA 139 09/15/2021 0523   NA 137  03/02/2019 1612   K 4.1 09/15/2021 0523   CL 101 09/15/2021 0523   CO2 27 09/15/2021 0503   GLUCOSE 87 09/15/2021 0523   BUN 19 09/15/2021 0523   BUN 8 03/02/2019 1612   CREATININE 0.80 09/15/2021 0523   CREATININE 0.80 01/08/2017 1449   CALCIUM 8.7 (L) 09/15/2021 0503   PROT 6.6 09/15/2021 0503   PROT 6.8 03/02/2019 1612   ALBUMIN 3.5 09/15/2021 0503   ALBUMIN 4.2 03/02/2019 1612   AST 28 09/15/2021 0503   ALT 25 09/15/2021 0503   ALKPHOS 68 09/15/2021 0503   BILITOT 0.5 09/15/2021 0503   BILITOT 0.2 03/02/2019 1612   GFRNONAA >60 09/15/2021 0503   GFRAA >60 02/17/2020 0453   INR 1.3  Imaging Reviewed: CT Head code stroke: No evidence of acute infarction or hemorrhage, chronic cortical encephalomalacia in frontal lobes MRI brain: No acute intracranial process MRI cervical spine: unchanged chronic myelomalacia involving cervical spinal cord, multilevel degenerative changes in cervical spine, worst at C5-C6 EEG within normal limits with no seizures or epileptiform discharges seen  Assessment: 76 year old with past medical history of HSV encephalomyelitis with progressive gait disorder, neurogenic bladder, progressive memory difficulties, anxiety, back pain, DVT on Eliquis brought in for evaluation of weakness with last known well at 10 PM. The weakness was more on the left compared to the right. - On exam today, she has mild weakness of all extremities with no increased deficits on the left. -  At baseline, she has had progressive difficulty with memory and gait related to her prior encephalomyelitis.  Per husband, her strength at baseline fluctuates. - At this point, her CT head does not show a bleed-which was our primary concern given the history of her being on blood thinners. - MRI has ruled out stroke and reveals chronic myelomalacia of the cervical spinal cord - Given negative MRI and EEG, no further neurological workup indicated. Most likely her presentation is related to  recrudescence of old symptoms in the setting of a toxic, metabolic or infectious process.  - Recommend continuing workup for metabolic causes of recrudescence of her weakness and continuing PT/OT support.   Recommendations: 1)Check UA to rule out UTI 2)Continue PT/OT  Cortney E Carron Curie , MSN, AGACNP-BC [facility]  Electronically signed: Dr. Kerney Elbe

## 2021-09-16 NOTE — Discharge Summary (Addendum)
Physician Discharge Summary  Katelyn Lamb BSJ:[id number] DOB: 1946/04/05 DOA: 09/15/2021  PCP: [name], MD  Admit date: 09/15/2021 Discharge date: 09/17/2021  Admitted From: Home Disposition:  Home  Discharge Condition:Stable CODE STATUS:FULL Diet recommendation: Regular   Brief/Interim Summary: Katelyn Lamb is a 76 y.o. female with medical history significant of DVT; dementia; VTE; seizure d/o; HSV encephalomyelitis; transverse mellitus, and neurogenic bladder presenting with weakness.  She noted to have worsening of weakness of her left upper extremity.  Patient was admitted for further work-up.  EEG was negative for seizures.  Neurology consulted.  MRI of the brain/MRI of the cervical spine did not show any acute findings but showed chronic multilevel cervical spondylosis, very stable foraminal stenosis, chronic encephalomalacia in the left upper cervical spine.  Neurosurgery was also consulted, no plan for intervention.  PT/OT recommend home health on discharge.  She is medically stable for discharge to home today.  Following problems were addressed during her hospitalization:  Weakness of left upper extremity- (present on admission) -Her husband reported BUE weakness followed by her left arm not staying on the bedside commode arm rail -CT negative -MRI brain unremarkable -MRI s-cpine appears unchanged but I have asked Dr. Kathyrn Sheriff to review to ensure that no intervention is needed -PT/OT consults,home health recommended   Incomplete paraplegia [facility])- (present on admission) -She has chronic generalized weakness and did not appear to have focal findings at the time of admission -PT/OT recommend home health.   History of pulmonary embolism- (present on admission) -B PE and DVT during last hospitalization after Xarelto was stopped for epistaxis with prior hospitalization -Continue Eliquis -She needs lifelong anticoagulation   Encephalomyelitis- (present on admission) -h/o  remote infection with chronic debility -She continues to take BID acyclovir   Chronic suprapubic catheter (HCC) -No evidence of infection at this time -Continue Myrbetriq -UA not suggestive of infection   DNR (do not resuscitate)- (present on admission) -I have discussed code status with the patient and her husband and  they are in agreement that the patient would not desire resuscitation and would prefer to die a natural death should that situation arise.   Chronic pain syndrome- (present on admission) -She has chronic debility and chronic pain -She is not on Keppra for seizures - denies h/o seizures - but rather for pain control according to family -Continue Lyrica (tapering off), Keppra -Continue Norco -Continue Cymbalta -Continue prednisone -She is not at particularly high risk of opioid misuse, diversion, or overdose.   Anxiety about health- (present on admission) -Continue Cymbalta, Seroquel, Valium  Soft blood pressure Likely chronic.  She is asymptomatic without any dizziness or lightheadedness.  Continue to monitor blood pressure at home.Started on midodrine   Discharge Diagnoses:  Principal Problem:   Weakness of left upper extremity Active Problems:   Encephalomyelitis   Incomplete paraplegia (HCC)   Anxiety about health   Chronic pain syndrome   DNR (do not resuscitate)   Chronic suprapubic catheter ([place])   History of pulmonary embolism    Discharge Instructions  Discharge Instructions     Diet general   Complete by: As directed    Discharge instructions   Complete by: As directed    1)Please continue your home medications 2)Follow up with your PCP and neurologist as an outpatient 3)follow up with home health   Increase activity slowly   Complete by: As directed       Allergies as of 09/17/2021       Reactions   Demerol [  meperidine] Other (See Comments)   Hallucinations   Percocet [oxycodone-acetaminophen] Itching   Amoxicillin-pot Clavulanate  Diarrhea   Severe pain, headache, intestinal infection   Penicillins Itching, Rash   Tolerated amoxicillin November 2022  Has patient had a PCN reaction causing immediate rash, facial/tongue/throat swelling, SOB or lightheadedness with hypotension:  NO Has patient had a PCN reaction causing severe rash involving mucus membranes or skin necrosis: No Has patient had a PCN reaction that required hospitalization: No Has patient had a PCN reaction occurring within the last 10 years: Yes If all of the above answers are "NO", then may proceed with Cephalosporin use.        Medication List     TAKE these medications    acyclovir 400 MG tablet Commonly known as: ZOVIRAX TAKE 1 TABLET BY MOUTH TWICE A DAY   albuterol (2.5 MG/3ML) 0.083% nebulizer solution Commonly known as: PROVENTIL Take 3 mLs (2.5 mg total) by nebulization every 2 (two) hours as needed for wheezing or shortness of breath.   apixaban 5 MG Tabs tablet Commonly known as: ELIQUIS Take 1 tablet (5 mg total) by mouth 2 (two) times daily.   CALCIUM 600 + D PO Take 1 tablet by mouth in the morning and at bedtime.   diazepam 5 MG tablet Commonly known as: VALIUM Take 1 tablet (5 mg total) by mouth every 12 (twelve) hours as needed for anxiety or muscle spasms. For tremors What changed:  reasons to take this additional instructions   docusate sodium 100 MG capsule Commonly known as: COLACE Take 200 mg by mouth daily as needed for mild constipation.   DULoxetine 60 MG capsule Commonly known as: CYMBALTA Take 1 capsule (60 mg total) by mouth at bedtime.   feeding supplement Liqd Take 237 mLs by mouth 2 (two) times daily between meals.   ferrous sulfate 325 (65 FE) MG tablet Take 325 mg by mouth every morning.   guaiFENesin 600 MG 12 hr tablet Commonly known as: MUCINEX Take 2 tablets (1,200 mg total) by mouth 2 (two) times daily.   HYDROcodone-acetaminophen 10-325 MG tablet Commonly known as: NORCO Take 1  tablet by mouth 3 (three) times daily as needed.   levETIRAcetam 250 MG tablet Commonly known as: KEPPRA TAKE 1 TABLET BY MOUTH TWICE A DAY   loperamide 2 MG capsule Commonly known as: IMODIUM Take 2 mg by mouth daily as needed for diarrhea or loose stools.   midodrine 5 MG tablet Commonly known as: PROAMATINE Take 1 tablet (5 mg total) by mouth 3 (three) times daily with meals.   mirabegron ER 50 MG Tb24 tablet Commonly known as: MYRBETRIQ Take 50 mg by mouth at bedtime.   omega-3 acid ethyl esters 1 g capsule Commonly known as: LOVAZA Take 1 g by mouth every morning.   pantoprazole 40 MG tablet Commonly known as: PROTONIX Take 1 tablet (40 mg total) by mouth daily.   predniSONE 5 MG tablet Commonly known as: DELTASONE TAKE 1 TABLET BY MOUTH EVERY DAY WITH BREAKFAST What changed: See the new instructions.   pregabalin 50 MG capsule Commonly known as: LYRICA Take 2 capsules (100 mg total) by mouth at bedtime. What changed: how much to take   QUEtiapine 100 MG tablet Commonly known as: SEROQUEL Take 100 mg by mouth at bedtime.   sodium chloride 0.65 % Soln nasal spray Commonly known as: OCEAN Place 1 spray into both nostrils as needed for congestion.   VITAMIN B-12 PO Take 500 mcg by mouth  daily.   Vitamin C 500 MG Chew Chew 500 mg by mouth daily.        Follow-up Information     Koirala, Dibas, MD. Schedule an appointment as soon as possible for a visit in 1 week(s).   Specialty: Family Medicine Contact information: [address] [place] [address] [phone]                Allergies  Allergen Reactions   Demerol [Meperidine] Other (See Comments)    Hallucinations   Percocet [Oxycodone-Acetaminophen] Itching   Amoxicillin-Pot Clavulanate Diarrhea    Severe pain, headache, intestinal infection   Penicillins Itching and Rash    Tolerated amoxicillin November 2022  Has patient had a PCN reaction causing immediate  rash, facial/tongue/throat swelling, SOB or lightheadedness with hypotension:  NO Has patient had a PCN reaction causing severe rash involving mucus membranes or skin necrosis: No Has patient had a PCN reaction that required hospitalization: No Has patient had a PCN reaction occurring within the last 10 years: Yes If all of the above answers are "NO", then may proceed with Cephalosporin use.    Consultations: Neurology, neurosurgery   Procedures/Studies: MR BRAIN WO CONTRAST  Result Date: 09/15/2021 CLINICAL DATA:  Stroke, follow-up, history of HSVencephalomyelitis EXAM: MRI HEAD WITHOUT CONTRAST MRI CERVICAL SPINE WITHOUT CONTRAST TECHNIQUE: Multiplanar, multiecho pulse sequences of the brain and surrounding structures were obtained without intravenous contrast. Multiplanar, multisequence MR imaging of the cervical spine was performed. No intravenous contrast was administered. COMPARISON:  03/15/2021 MRI head, correlation is also made with CT head 09/15/2021 02/01/2020 MRI cervical spine, correlation is also made with 07/14/2021 CT cervical spine FINDINGS: HEAD FINDINGS: Brain: No restricted diffusion to suggest acute or subacute infarct. No acute hemorrhage, mass, mass effect, or midline shift. No hydrocephalus or extra-axial collection. Unchanged ventricular prominence, which is commensurate with parenchymal volume loss. Confluent T2 hyperintense signal in the periventricular white matter and pons, likely the sequela of moderate to severe chronic small vessel ischemic disease, unchanged from the prior exam no foci. Of hemosiderin deposition to suggest remote hemorrhage. Vascular: Normal flow voids. Skull and upper cervical spine: Normal marrow signal. Sinuses/Orbits: Negative.  Status post bilateral lens replacements. Other: Fluid throughout the right mastoid air cells. Trace fluid in the left mastoid air cells. CERVICAL SPINE FINDINGS Evaluation is somewhat limited by motion artifact. Alignment:  Unchanged reversal of the normal cervical lordosis and trace retrolisthesis of C6 on C7. Vertebrae: No acute fracture or suspicious osseous lesion. Redemonstrated hemangioma in the T2 vertebral body. Cord: Redemonstrated increased T2 signal and spinal cord atrophy involving the central/dorsal aspect of the cord at C3-C4, unchanged, with additional mild patchy increased T2 signal extending more inferiorly into the thoracic spinal cord, likely related to the patient's history of transverse myelitis. No new areas of increased T2 signal in the spinal cord. Posterior Fossa, vertebral arteries, paraspinal tissues: Negative. Disc levels: C2-C3: Small central disc protrusion. Facet arthropathy. No spinal canal stenosis. Mild right neural foraminal narrowing, which appears to progressed from prior exam. C3-C4: Disc height loss and disc osteophyte complex. No spinal canal stenosis. Uncovertebral and facet arthropathy. Mild bilateral neural foraminal narrowing. C4-C5: Disc height loss with diffuse disc osteophyte complex. Facet and uncovertebral hypertrophy. Mild spinal canal stenosis, unchanged. Moderate bilateral neural foraminal narrowing, unchanged. C5-C6: Disc height loss with moderate disc bulge. Facet and uncovertebral hypertrophy. This level is particularly motion degraded, but appears to have unchanged moderate spinal canal stenosis and severe bilateral neural foraminal  narrowing. C6-C7: Disc height loss and disc osteophyte complex. Uncovertebral and facet arthropathy. Mild spinal canal stenosis. Moderate bilateral neural foraminal narrowing, unchanged. C7-T1: No significant disc bulge. Uncovertebral and facet arthropathy. No spinal canal stenosis or neural foraminal narrowing. IMPRESSION: 1. No acute intracranial process. No significant change from the prior exam. 2. Unchanged chronic myelomalacia involving the cervical spinal cord, most pronounced at C3-C4, which is presumed to be related to the patient's history of  transverse myelitis. No new areas of abnormal signal. 3. Redemonstrated multilevel degenerative changes, worst at C5-C6, where there is moderate spinal canal stenosis and severe bilateral neural foraminal narrowing. These degenerative findings are largely unchanged compared to 02/01/2020, with the exception of the right neural foramen at C2-C3, which appears mildly narrowed. Electronically Signed   By: Merilyn Baba M.D.   On: 09/15/2021 11:50   MR CERVICAL SPINE WO CONTRAST  Result Date: 09/15/2021 CLINICAL DATA:  Stroke, follow-up, history of HSVencephalomyelitis EXAM: MRI HEAD WITHOUT CONTRAST MRI CERVICAL SPINE WITHOUT CONTRAST TECHNIQUE: Multiplanar, multiecho pulse sequences of the brain and surrounding structures were obtained without intravenous contrast. Multiplanar, multisequence MR imaging of the cervical spine was performed. No intravenous contrast was administered. COMPARISON:  03/15/2021 MRI head, correlation is also made with CT head 09/15/2021 02/01/2020 MRI cervical spine, correlation is also made with 07/14/2021 CT cervical spine FINDINGS: HEAD FINDINGS: Brain: No restricted diffusion to suggest acute or subacute infarct. No acute hemorrhage, mass, mass effect, or midline shift. No hydrocephalus or extra-axial collection. Unchanged ventricular prominence, which is commensurate with parenchymal volume loss. Confluent T2 hyperintense signal in the periventricular white matter and pons, likely the sequela of moderate to severe chronic small vessel ischemic disease, unchanged from the prior exam no foci. Of hemosiderin deposition to suggest remote hemorrhage. Vascular: Normal flow voids. Skull and upper cervical spine: Normal marrow signal. Sinuses/Orbits: Negative.  Status post bilateral lens replacements. Other: Fluid throughout the right mastoid air cells. Trace fluid in the left mastoid air cells. CERVICAL SPINE FINDINGS Evaluation is somewhat limited by motion artifact. Alignment: Unchanged  reversal of the normal cervical lordosis and trace retrolisthesis of C6 on C7. Vertebrae: No acute fracture or suspicious osseous lesion. Redemonstrated hemangioma in the T2 vertebral body. Cord: Redemonstrated increased T2 signal and spinal cord atrophy involving the central/dorsal aspect of the cord at C3-C4, unchanged, with additional mild patchy increased T2 signal extending more inferiorly into the thoracic spinal cord, likely related to the patient's history of transverse myelitis. No new areas of increased T2 signal in the spinal cord. Posterior Fossa, vertebral arteries, paraspinal tissues: Negative. Disc levels: C2-C3: Small central disc protrusion. Facet arthropathy. No spinal canal stenosis. Mild right neural foraminal narrowing, which appears to progressed from prior exam. C3-C4: Disc height loss and disc osteophyte complex. No spinal canal stenosis. Uncovertebral and facet arthropathy. Mild bilateral neural foraminal narrowing. C4-C5: Disc height loss with diffuse disc osteophyte complex. Facet and uncovertebral hypertrophy. Mild spinal canal stenosis, unchanged. Moderate bilateral neural foraminal narrowing, unchanged. C5-C6: Disc height loss with moderate disc bulge. Facet and uncovertebral hypertrophy. This level is particularly motion degraded, but appears to have unchanged moderate spinal canal stenosis and severe bilateral neural foraminal narrowing. C6-C7: Disc height loss and disc osteophyte complex. Uncovertebral and facet arthropathy. Mild spinal canal stenosis. Moderate bilateral neural foraminal narrowing, unchanged. C7-T1: No significant disc bulge. Uncovertebral and facet arthropathy. No spinal canal stenosis or neural foraminal narrowing. IMPRESSION: 1. No acute intracranial process. No significant change from the prior exam. 2. Unchanged chronic  myelomalacia involving the cervical spinal cord, most pronounced at C3-C4, which is presumed to be related to the patient's history of  transverse myelitis. No new areas of abnormal signal. 3. Redemonstrated multilevel degenerative changes, worst at C5-C6, where there is moderate spinal canal stenosis and severe bilateral neural foraminal narrowing. These degenerative findings are largely unchanged compared to 02/01/2020, with the exception of the right neural foramen at C2-C3, which appears mildly narrowed. Electronically Signed   By: Merilyn Baba M.D.   On: 09/15/2021 11:50   DG Chest Port 1 View  Result Date: 09/15/2021 CLINICAL DATA:  Weakness.  Code stroke EXAM: PORTABLE CHEST 1 VIEW COMPARISON:  08/15/2021 FINDINGS: Artifact from EKG leads. Normal heart size and mediastinal contours. There is no edema, consolidation, effusion, or pneumothorax. Osteopenia. IMPRESSION: Negative portable chest. Electronically Signed   By: Jorje Guild M.D.   On: 09/15/2021 06:03   EEG adult  Result Date: 09/15/2021 Lora Havens, MD     09/15/2021 12:45 PM Patient Name: DAVIN MURAMOTO MRN: [id number] Epilepsy Attending: Lora Havens Referring Physician/Provider: Karmen Bongo, MD Date: 09/15/2021 Duration: 26.22 mins Patient history: 77 year old with past medical history of HSV encephalomyelitis with progressive gait disorder, neurogenic bladder, progressive memory difficulties, anxiety, back pain, DVT on Eliquis brought in for evaluation of weakness. EEG to evaluate for seizure Level of alertness: Awake, asleep AEDs during EEG study: LEV, Pregabalin Technical aspects: This EEG study was done with scalp electrodes positioned according to the 10-20 International system of electrode placement. Electrical activity was acquired at a sampling rate of 500Hz  and reviewed with a high frequency filter of 70Hz  and a low frequency filter of 1Hz . EEG data were recorded continuously and digitally stored. Description: The posterior dominant rhythm consists of 7.5-8 Hz activity of moderate voltage (25-35 uV) seen predominantly in posterior head regions,  symmetric and reactive to eye opening and eye closing.  Sleep was characterized by sleep spindles (12 to 14 Hz), maximal frontocentral region. Physiologic photic driving was not seen during photic stimulation.  Hyperventilation was not performed.   IMPRESSION: This study is within normal limits. No seizures or epileptiform discharges were seen throughout the recording. Lora Havens   CT HEAD CODE STROKE WO CONTRAST  Result Date: 09/15/2021 CLINICAL DATA:  Code stroke.  Neuro deficit EXAM: CT HEAD WITHOUT CONTRAST TECHNIQUE: Contiguous axial images were obtained from the base of the skull through the vertex without intravenous contrast. RADIATION DOSE REDUCTION: This exam was performed according to the departmental dose-optimization program which includes automated exposure control, adjustment of the mA and/or kV according to patient size and/or use of iterative reconstruction technique. COMPARISON:  07/14/2021 FINDINGS: Brain: No evidence of acute infarction, hemorrhage, hydrocephalus, extra-axial collection or mass lesion/mass effect. White matter low-density accentuating the bilateral frontal lobes where there is small areas of chronic cortical encephalomalacia. Vascular: No convincing hyperdense vessel. Skull: Normal. Negative for fracture or focal lesion. Sinuses/Orbits: No acute finding. Other: These results were communicated to Dr. Rory Percy at 5:16 am on 09/15/2021 by text page via the [facility] messaging system. ASPECTS [facility] Stroke Program Early CT Score) Not scored without localizing symptom IMPRESSION: No acute finding or change from November 2022. Electronically Signed   By: Jorje Guild M.D.   On: 09/15/2021 05:15      Subjective: Patient seen and examined at the bedside this morning.  Hemodynamically stable.  Very deconditioned, chronically ill looking.  She says her weakness on the left upper extremity is close to her baseline.  Discharge planning discussed with the husband at  bedside  Discharge Exam: Vitals:   09/17/21 0322 09/17/21 0847  BP: 108/76 (!) 91/58  Pulse: 87 81  Resp: (!) 24 20  Temp: 98.8 F (37.1 C) 98.4 F (36.9 C)  SpO2: 97% 97%   Vitals:   09/16/21 1938 09/16/21 2333 09/17/21 0322 09/17/21 0847  BP: 116/73 113/72 108/76 (!) 91/58  Pulse: 88 83 87 81  Resp: 17 20 (!) 24 20  Temp: 97.7 F (36.5 C) 98.8 F (37.1 C) 98.8 F (37.1 C) 98.4 F (36.9 C)  TempSrc: Oral Oral Oral Oral  SpO2: 91% 96% 97% 97%  Weight:      Height:        General: Pt is alert, awake, not in acute distress.  Very deconditioned, chronically looking Cardiovascular: RRR, S1/S2 +, no rubs, no gallops Respiratory: CTA bilaterally, no wheezing, no rhonchi Abdominal: Soft, NT, ND, bowel sounds + Extremities: no edema, no cyanosis, generalized weakness    The results of significant diagnostics from this hospitalization (including imaging, microbiology, ancillary and laboratory) are listed below for reference.     Microbiology: Recent Results (from the past 240 hour(s))  Resp Panel by RT-PCR (Flu A&B, Covid) Nasopharyngeal Swab     Status: None   Collection Time: 09/15/21  4:57 AM   Specimen: Nasopharyngeal Swab; Nasopharyngeal(NP) swabs in vial transport medium  Result Value Ref Range Status   SARS Coronavirus 2 by RT PCR NEGATIVE NEGATIVE Final    Comment: (NOTE) SARS-CoV-2 target nucleic acids are NOT DETECTED.  The SARS-CoV-2 RNA is generally detectable in upper respiratory specimens during the acute phase of infection. The lowest concentration of SARS-CoV-2 viral copies this assay can detect is 138 copies/mL. A negative result does not preclude SARS-Cov-2 infection and should not be used as the sole basis for treatment or other patient management decisions. A negative result may occur with  improper specimen collection/handling, submission of specimen other than nasopharyngeal swab, presence of viral mutation(s) within the areas targeted by this  assay, and inadequate number of viral copies(<138 copies/mL). A negative result must be combined with clinical observations, patient history, and epidemiological information. The expected result is Negative.  Fact Sheet for Patients:  EntrepreneurPulse.com.au  Fact Sheet for Healthcare Providers:  IncredibleEmployment.be  This test is no t yet approved or cleared by the [place] FDA and  has been authorized for detection and/or diagnosis of SARS-CoV-2 by FDA under an Emergency Use Authorization (EUA). This EUA will remain  in effect (meaning this test can be used) for the duration of the COVID-19 declaration under Section 564(b)(1) of the Act, 21 U.S.C.section 360bbb-3(b)(1), unless the authorization is terminated  or revoked sooner.       Influenza A by PCR NEGATIVE NEGATIVE Final   Influenza B by PCR NEGATIVE NEGATIVE Final    Comment: (NOTE) The Xpert Xpress SARS-CoV-2/FLU/RSV plus assay is intended as an aid in the diagnosis of influenza from Nasopharyngeal swab specimens and should not be used as a sole basis for treatment. Nasal washings and aspirates are unacceptable for Xpert Xpress SARS-CoV-2/FLU/RSV testing.  Fact Sheet for Patients: EntrepreneurPulse.com.au  Fact Sheet for Healthcare Providers: IncredibleEmployment.be  This test is not yet approved or cleared by the [place] FDA and has been authorized for detection and/or diagnosis of SARS-CoV-2 by FDA under an Emergency Use Authorization (EUA). This EUA will remain in effect (meaning this test can be used) for the duration of the COVID-19 declaration under Section 564(b)(1) of  the Act, 21 U.S.C. section 360bbb-3(b)(1), unless the authorization is terminated or revoked.  Performed at [facility], [place] [address], [place], [place] [address]      Labs: BNP (last 3 results) Recent Labs    08/07/21 0546 08/12/21 1111  08/13/21 0036  BNP 54.0 16.5 82.4   Basic Metabolic Panel: Recent Labs  Lab 09/15/21 0503 09/15/21 0523  NA 136 139  K 4.0 4.1  CL 102 101  CO2 27  --   GLUCOSE 90 87  BUN 17 19  CREATININE 0.82 0.80  CALCIUM 8.7*  --    Liver Function Tests: Recent Labs  Lab 09/15/21 0503  AST 28  ALT 25  ALKPHOS 68  BILITOT 0.5  PROT 6.6  ALBUMIN 3.5   No results for input(s): LIPASE, AMYLASE in the last 168 hours. No results for input(s): AMMONIA in the last 168 hours. CBC: Recent Labs  Lab 09/15/21 0503 09/15/21 0523  WBC 10.2  --   NEUTROABS 5.0  --   HGB 11.1* 12.6  HCT 37.5 37.0  MCV 96.9  --   PLT 249  --    Cardiac Enzymes: No results for input(s): CKTOTAL, CKMB, CKMBINDEX, TROPONINI in the last 168 hours. BNP: Invalid input(s): POCBNP CBG: Recent Labs  Lab 09/15/21 0459  GLUCAP 93   D-Dimer No results for input(s): DDIMER in the last 72 hours. Hgb A1c No results for input(s): HGBA1C in the last 72 hours. Lipid Profile Recent Labs    09/16/21 0235  CHOL 150  HDL 48  LDLCALC 76  TRIG 130  CHOLHDL 3.1   Thyroid function studies No results for input(s): TSH, T4TOTAL, T3FREE, THYROIDAB in the last 72 hours.  Invalid input(s): FREET3 Anemia work up No results for input(s): VITAMINB12, FOLATE, FERRITIN, TIBC, IRON, RETICCTPCT in the last 72 hours. Urinalysis    Component Value Date/Time   COLORURINE YELLOW 08/06/2021 2300   APPEARANCEUR CLOUDY (A) 08/06/2021 2300   LABSPEC 1.021 08/06/2021 2300   PHURINE 5.0 08/06/2021 2300   GLUCOSEU NEGATIVE 08/06/2021 2300   HGBUR NEGATIVE 08/06/2021 2300   BILIRUBINUR NEGATIVE 08/06/2021 2300   KETONESUR NEGATIVE 08/06/2021 2300   PROTEINUR 30 (A) 08/06/2021 2300   NITRITE NEGATIVE 08/06/2021 2300   LEUKOCYTESUR MODERATE (A) 08/06/2021 2300   Sepsis Labs Invalid input(s): PROCALCITONIN,  WBC,  LACTICIDVEN Microbiology Recent Results (from the past 240 hour(s))  Resp Panel by RT-PCR (Flu A&B, Covid)  Nasopharyngeal Swab     Status: None   Collection Time: 09/15/21  4:57 AM   Specimen: Nasopharyngeal Swab; Nasopharyngeal(NP) swabs in vial transport medium  Result Value Ref Range Status   SARS Coronavirus 2 by RT PCR NEGATIVE NEGATIVE Final    Comment: (NOTE) SARS-CoV-2 target nucleic acids are NOT DETECTED.  The SARS-CoV-2 RNA is generally detectable in upper respiratory specimens during the acute phase of infection. The lowest concentration of SARS-CoV-2 viral copies this assay can detect is 138 copies/mL. A negative result does not preclude SARS-Cov-2 infection and should not be used as the sole basis for treatment or other patient management decisions. A negative result may occur with  improper specimen collection/handling, submission of specimen other than nasopharyngeal swab, presence of viral mutation(s) within the areas targeted by this assay, and inadequate number of viral copies(<138 copies/mL). A negative result must be combined with clinical observations, patient history, and epidemiological information. The expected result is Negative.  Fact Sheet for Patients:  EntrepreneurPulse.com.au  Fact Sheet for Healthcare Providers:  IncredibleEmployment.be  This test is no t yet approved or cleared by the [place] and  has been authorized for detection and/or diagnosis of SARS-CoV-2 by FDA under an Emergency Use Authorization (EUA). This EUA will remain  in effect (meaning this test can be used) for the duration of the COVID-19 declaration under Section 564(b)(1) of the Act, 21 U.S.C.section 360bbb-3(b)(1), unless the authorization is terminated  or revoked sooner.       Influenza A by PCR NEGATIVE NEGATIVE Final   Influenza B by PCR NEGATIVE NEGATIVE Final    Comment: (NOTE) The Xpert Xpress SARS-CoV-2/FLU/RSV plus assay is intended as an aid in the diagnosis of influenza from Nasopharyngeal swab specimens and should not be  used as a sole basis for treatment. Nasal washings and aspirates are unacceptable for Xpert Xpress SARS-CoV-2/FLU/RSV testing.  Fact Sheet for Patients: EntrepreneurPulse.com.au  Fact Sheet for Healthcare Providers: IncredibleEmployment.be  This test is not yet approved or cleared by the [place] FDA and has been authorized for detection and/or diagnosis of SARS-CoV-2 by FDA under an Emergency Use Authorization (EUA). This EUA will remain in effect (meaning this test can be used) for the duration of the COVID-19 declaration under Section 564(b)(1) of the Act, 21 U.S.C. section 360bbb-3(b)(1), unless the authorization is terminated or revoked.  Performed at [facility], [place] [address], [place], [place] [address]     Please note: You were cared for by a hospitalist during your hospital stay. Once you are discharged, your primary care physician will handle any further medical issues. Please note that NO REFILLS for any discharge medications will be authorized once you are discharged, as it is imperative that you return to your primary care physician (or establish a relationship with a primary care physician if you do not have one) for your post hospital discharge needs so that they can reassess your need for medications and monitor your lab values.    Time coordinating discharge: 40 minutes  SIGNED:   Shelly Coss, MD  [facility] 09/17/2021, 10:19 AM Pager [phone]  If 7PM-7AM, please contact night-coverage [email or web address] Password [email or web address]

## 2021-09-16 NOTE — Care Management Obs Status (Signed)
[place] NOTIFICATION   Patient Details  Name: Katelyn Lamb MRN: [id number] Date of Birth: 07/19/1946   Medicare Observation Status Notification Given:  Yes    Carles Collet, RN 09/16/2021, 1:34 PM

## 2021-09-16 NOTE — TOC Initial Note (Addendum)
Transition of Care [facility]) - Initial/Assessment Note    Patient Details  Name: Katelyn Lamb MRN: [id number] Date of Birth: 09-Jul-1946  [facility]) CM/SW Contact:    Carles Collet, RN Phone Number: 09/16/2021, 2:50 PM  Clinical Narrative:        Spoke to patient and spouse at bedside.  Spouse expresses concern since working with OT that he will not be able to support at DC. He is concerns with transfers to the 3/1 as he states that she was having diarrhea and he could not get to the the 3/1 in time using the hoyer lift.  They have hoyer, hospital bed, WC, 3/1. They have caregivers M-F to assist but not on weekends.     [place] services are active with [facility].   Spouse feels that patient has made progress in last 24 hours and he anticipates that she will make enough progress to be abel to be DC'd in the AM.   Patient remains in observation. Reviewed with spouse that they do not have medicare right to appeal if DC order written in observation status. He verbalized understanding.         Call out to HTA for authorization for PTAR Update Enzo Bi approved for 09-26-21 to 12-15-21 Auth # [id number]   Expected Discharge Plan: [place] Barriers to Discharge: Continued Medical Work up   Patient Goals and CMS Choice Patient states their goals for this hospitalization and ongoing recovery are:: to return home CMS Medicare.gov Compare Post Acute Care list provided to:: Other (Comment Required) Choice offered to / list presented to : Spouse  Expected Discharge Plan and Services Expected Discharge Plan: [place]   Discharge Planning Services: CM Consult Post Acute Care Choice: [place] arrangements for the past 2 months: Single Family Home Expected Discharge Date: 09/16/21                         HH Arranged: PT, OT HH Agency: [facility] Date [place]: 09/16/21 Time [place]: 1449 Representative spoke with  at [place]: [place] Arrangements/Services Living arrangements for the past 2 months: [place] with:: Spouse              Current home services: DME    Activities of Daily Living Home Assistive Devices/Equipment: Wheelchair ADL Screening (condition at time of admission) Patient's cognitive ability adequate to safely complete daily activities?: Yes Is the patient deaf or have difficulty hearing?: No Does the patient have difficulty seeing, even when wearing glasses/contacts?: No Does the patient have difficulty concentrating, remembering, or making decisions?: Yes Patient able to express need for assistance with ADLs?: Yes Does the patient have difficulty dressing or bathing?: Yes Independently performs ADLs?: No Communication: Needs assistance Is this a change from baseline?: Pre-admission baseline Dressing (OT): Needs assistance Is this a change from baseline?: Pre-admission baseline Grooming: Needs assistance Is this a change from baseline?: Pre-admission baseline Feeding: Needs assistance Is this a change from baseline?: Pre-admission baseline Bathing: Needs assistance Is this a change from baseline?: Pre-admission baseline Toileting: Needs assistance Is this a change from baseline?: Pre-admission baseline In/Out Bed: Needs assistance Is this a change from baseline?: Pre-admission baseline Walks in Home: Dependent Is this a change from baseline?: Pre-admission baseline Does the patient have difficulty walking or climbing stairs?: Yes Weakness of Legs: Both Weakness of Arms/Hands: Both  Permission Sought/Granted  Emotional Assessment              Admission diagnosis:  Weakness of left upper extremity [R29.898] Left-sided weakness [R53.1] Patient Active Problem List   Diagnosis Date Noted   Weakness of left upper extremity 09/15/2021   Chronic suprapubic catheter ([place]) 09/15/2021   History of pulmonary  embolism 09/15/2021   Acute pulmonary embolism ([place]) 08/07/2021   Abdominal pain 08/07/2021   Elevated troponin 08/07/2021   Lactic acidosis 08/07/2021   Seizure disorder ([place]) 08/07/2021   GERD (gastroesophageal reflux disease) 08/07/2021   Platelet inhibition due to Plavix    Epistaxis s/p embolization 07-18-2021 07/17/2021   Asymptomatic bacteriuria    History of ESBL E. coli infection 07/14/2021   History of ESBL Klebsiella pneumoniae infection 07/14/2021   Generalized weakness 03/15/2021   Acute cystitis with hematuria 03/15/2021   Encephalopathy 03/15/2021   Recurrent falls 03/15/2021   Right pulmonary embolus ([place]) 02/12/2020   Lower extremity weakness 02/02/2020   Macrocytic anemia 02/02/2020   Spinal stenosis 02/02/2020   History of recurrent UTI (urinary tract infection)    Bacterial infection due to Klebsiella pneumoniae    Laceration of left hand 12/09/2019   DNR (do not resuscitate) 12/09/2019   Pelvic fracture ([place]) 12/06/2019   AKI (acute kidney injury) ([place]) 12/06/2019   Urinary tract infection associated with catheterization of urinary tract, initial encounter ([place]) 07/23/2019   Hematuria 07/23/2019   Acute respiratory failure with hypoxia ([place]) 07/23/2019   Hypokalemia 07/23/2019   Acute on chronic anemia 07/23/2019   Fever 07/23/2019   Leukocytosis 07/23/2019   Sepsis ([place]) 07/23/2019   Generalized anxiety disorder 06/17/2019   Menopausal sweats 06/17/2019   Memory difficulty 03/02/2019   Degenerative spondylolisthesis 09/02/2018   Delirium 03/26/2018   Degeneration of lumbar intervertebral disc 11/08/2017   Lumbar radiculopathy 11/06/2017   Chronic neck pain 09/12/2017   Chronic low back pain 09/06/2017   Chronic pain syndrome 09/06/2017   Dilated pancreatic duct 01/24/2017   DVT, lower extremity, distal, chronic ([place]) 01/22/2017   Elevated serum GGT level 01/22/2017   Medication monitoring encounter 01/08/2017   Neurologic gait dysfunction  11/14/2016   Hypotension due to drugs    Urinary retention    Anxiety about health    Reactive depression    Ataxia    Abdominal spasms    Constipation due to pain medication    Acute lower UTI    Dysuria    Acute deep vein thrombosis (DVT) of popliteal vein of left lower extremity (HCC)    Incomplete paraplegia (HCC)    Acute blood loss anemia    Neurogenic bladder    Neuropathic pain    Muscle spasm    Gastroesophageal reflux disease    Slow transit constipation    Thrombocytopenia (HCC) 10/03/2016   Abnormal MRI, spinal cord    Encephalomyelitis    Numbness    Intractable back pain 09/20/2016   Numbness of left lower extremity 09/20/2016   Hyponatremia 09/20/2016   Herpes zoster without complication 31/51/7616   Spondylosis of cervical region without myelopathy or radiculopathy 06/16/2015   PCP:  [name], MD Pharmacy:   [id number] [place], [place] [address] [place] [place] [address] Phone: [phone] Fax: [phone]     Social Determinants of Health (SDOH) Interventions    Readmission Risk Interventions No flowsheet data found.

## 2021-09-17 DIAGNOSIS — Z743 Need for continuous supervision: Secondary | ICD-10-CM | POA: Diagnosis not present

## 2021-09-17 DIAGNOSIS — R4182 Altered mental status, unspecified: Secondary | ICD-10-CM | POA: Diagnosis not present

## 2021-09-17 DIAGNOSIS — R29898 Other symptoms and signs involving the musculoskeletal system: Secondary | ICD-10-CM | POA: Diagnosis not present

## 2021-09-17 MED ORDER — MIDODRINE HCL 5 MG PO TABS: 5.0000 mg | ORAL_TABLET | Freq: Three times a day (TID) | ORAL | 1 refills | Status: DC

## 2021-09-17 NOTE — TOC Transition Note (Signed)
Transition of Care [facility]) - CM/SW Discharge Note   Patient Details  Name: KERRIGAN GOMBOS MRN: [id number] Date of Birth: 09/23/1945  [facility] CM/SW Contact:  Carles Collet, RN Phone Number: 09/17/2021, 3:00 PM   Clinical Narrative:    Spoke w patient and spouse at bedside. Confirmed plans to return home w [facility] services. PTAR arranged for today, HTA auth approved. No other TOC needs for DC    Final next level of care: Home w Home Health Services Barriers to Discharge: No Barriers Identified   Patient Goals and CMS Choice Patient states their goals for this hospitalization and ongoing recovery are:: to return home CMS Medicare.gov Compare Post Acute Care list provided to:: Other (Comment Required) Choice offered to / list presented to : Spouse  Discharge Placement                       Discharge Plan and Services   Discharge Planning Services: CM Consult Post Acute Care Choice: Home Health                    HH Arranged: PT, OT [facility] Agency: [facility] Date [place]: 09/17/21 Time [place]: [id number] Representative spoke with at [place]: [place] Determinants of Health ([place]) Interventions     Readmission Risk Interventions No flowsheet data found.

## 2021-09-17 NOTE — Plan of Care (Signed)

## 2021-09-17 NOTE — Progress Notes (Signed)
Patient ready for discharge, transport pick-up scheduled, husband at bedside. Pt will discharge with chronic suprapubic catheter in place.

## 2021-09-21 DIAGNOSIS — R296 Repeated falls: Secondary | ICD-10-CM | POA: Diagnosis not present

## 2021-09-21 DIAGNOSIS — R29898 Other symptoms and signs involving the musculoskeletal system: Secondary | ICD-10-CM | POA: Diagnosis not present

## 2021-09-21 DIAGNOSIS — R031 Nonspecific low blood-pressure reading: Secondary | ICD-10-CM | POA: Diagnosis not present

## 2021-09-21 DIAGNOSIS — G8222 Paraplegia, incomplete: Secondary | ICD-10-CM | POA: Diagnosis not present

## 2021-09-21 DIAGNOSIS — Z9359 Other cystostomy status: Secondary | ICD-10-CM | POA: Diagnosis not present

## 2021-09-21 DIAGNOSIS — F331 Major depressive disorder, recurrent, moderate: Secondary | ICD-10-CM | POA: Diagnosis not present

## 2021-09-21 DIAGNOSIS — E7800 Pure hypercholesterolemia, unspecified: Secondary | ICD-10-CM | POA: Diagnosis not present

## 2021-09-24 DIAGNOSIS — G8222 Paraplegia, incomplete: Secondary | ICD-10-CM | POA: Diagnosis not present

## 2021-09-24 DIAGNOSIS — J9601 Acute respiratory failure with hypoxia: Secondary | ICD-10-CM | POA: Diagnosis not present

## 2021-09-24 DIAGNOSIS — J168 Pneumonia due to other specified infectious organisms: Secondary | ICD-10-CM | POA: Diagnosis not present

## 2021-09-25 DIAGNOSIS — R339 Retention of urine, unspecified: Secondary | ICD-10-CM | POA: Diagnosis not present

## 2021-09-27 DIAGNOSIS — R339 Retention of urine, unspecified: Secondary | ICD-10-CM | POA: Diagnosis not present

## 2021-09-28 ENCOUNTER — Telehealth: Payer: Self-pay | Admitting: Family Medicine

## 2021-09-28 MED ORDER — DIAZEPAM 5 MG PO TABS: 5.0000 mg | ORAL_TABLET | Freq: Two times a day (BID) | ORAL | 0 refills | Status: DC | PRN

## 2021-09-28 NOTE — Telephone Encounter (Signed)
Dr. Felecia Shelling- are you ok with refilling this for pt? She will be TOC from Dr. Jannifer Franklin. Looks like he prescribed for tremors/anxiety

## 2021-09-28 NOTE — Telephone Encounter (Signed)
..   Pt understands that although there may be some limitations with this type of visit, we will take all precautions to reduce any security or privacy concerns.  Pt understands that this will be treated like an in office visit and we will file with pt's insurance, and there may be a patient responsible charge related to this service. ? ?

## 2021-09-28 NOTE — Telephone Encounter (Signed)
Confirmed MD sent to pharmacy.

## 2021-09-28 NOTE — Telephone Encounter (Signed)
Pt is requesting a refill for diazepam (VALIUM) 5 MG tablet.  Pharmacy: [id number] Husband states before last refill done thru the hospital it was Dr Jannifer Franklin that filled this medication for pt

## 2021-09-28 NOTE — Telephone Encounter (Deleted)
Received refill request for Valium.  Last OV was on 02/09/21.  Next OV is scheduled for 12/25/21.  Last RX was written on 05/13/21 for 180 tabs.   [place] Drug Database has been reviewed.

## 2021-09-28 NOTE — Telephone Encounter (Signed)
Pt last seen 02/09/21 with Dr. Jannifer Franklin and next follow up 12/25/21 with AL,NP. Pt will be transferring care to Dr. Felecia Shelling.  Checked drug registry. Pt was getting diazepam 5mg  tab #180 from Dr Jannifer Franklin. Dr Jannifer Franklin last refilled 05/13/21 #180

## 2021-09-29 DIAGNOSIS — W19XXXA Unspecified fall, initial encounter: Secondary | ICD-10-CM | POA: Diagnosis not present

## 2021-09-29 DIAGNOSIS — M79671 Pain in right foot: Secondary | ICD-10-CM | POA: Diagnosis not present

## 2021-10-04 DIAGNOSIS — F5101 Primary insomnia: Secondary | ICD-10-CM | POA: Diagnosis not present

## 2021-10-04 DIAGNOSIS — F411 Generalized anxiety disorder: Secondary | ICD-10-CM | POA: Diagnosis not present

## 2021-10-04 DIAGNOSIS — I2699 Other pulmonary embolism without acute cor pulmonale: Secondary | ICD-10-CM | POA: Diagnosis not present

## 2021-10-04 DIAGNOSIS — M170 Bilateral primary osteoarthritis of knee: Secondary | ICD-10-CM | POA: Diagnosis not present

## 2021-10-04 DIAGNOSIS — K219 Gastro-esophageal reflux disease without esophagitis: Secondary | ICD-10-CM | POA: Diagnosis not present

## 2021-10-04 DIAGNOSIS — Z466 Encounter for fitting and adjustment of urinary device: Secondary | ICD-10-CM | POA: Diagnosis not present

## 2021-10-04 DIAGNOSIS — Z435 Encounter for attention to cystostomy: Secondary | ICD-10-CM | POA: Diagnosis not present

## 2021-10-04 DIAGNOSIS — Z86718 Personal history of other venous thrombosis and embolism: Secondary | ICD-10-CM | POA: Diagnosis not present

## 2021-10-04 DIAGNOSIS — N319 Neuromuscular dysfunction of bladder, unspecified: Secondary | ICD-10-CM | POA: Diagnosis not present

## 2021-10-04 DIAGNOSIS — Z7901 Long term (current) use of anticoagulants: Secondary | ICD-10-CM | POA: Diagnosis not present

## 2021-10-04 DIAGNOSIS — Z9981 Dependence on supplemental oxygen: Secondary | ICD-10-CM | POA: Diagnosis not present

## 2021-10-04 DIAGNOSIS — G2581 Restless legs syndrome: Secondary | ICD-10-CM | POA: Diagnosis not present

## 2021-10-04 DIAGNOSIS — R338 Other retention of urine: Secondary | ICD-10-CM | POA: Diagnosis not present

## 2021-10-04 DIAGNOSIS — I82441 Acute embolism and thrombosis of right tibial vein: Secondary | ICD-10-CM | POA: Diagnosis not present

## 2021-10-04 DIAGNOSIS — F331 Major depressive disorder, recurrent, moderate: Secondary | ICD-10-CM | POA: Diagnosis not present

## 2021-10-04 DIAGNOSIS — E46 Unspecified protein-calorie malnutrition: Secondary | ICD-10-CM | POA: Diagnosis not present

## 2021-10-04 DIAGNOSIS — Z87440 Personal history of urinary (tract) infections: Secondary | ICD-10-CM | POA: Diagnosis not present

## 2021-10-04 DIAGNOSIS — Z7952 Long term (current) use of systemic steroids: Secondary | ICD-10-CM | POA: Diagnosis not present

## 2021-10-04 DIAGNOSIS — R7303 Prediabetes: Secondary | ICD-10-CM | POA: Diagnosis not present

## 2021-10-04 DIAGNOSIS — N189 Chronic kidney disease, unspecified: Secondary | ICD-10-CM | POA: Diagnosis not present

## 2021-10-04 DIAGNOSIS — I700 Atherosclerosis of aorta: Secondary | ICD-10-CM | POA: Diagnosis not present

## 2021-10-04 DIAGNOSIS — G40909 Epilepsy, unspecified, not intractable, without status epilepticus: Secondary | ICD-10-CM | POA: Diagnosis not present

## 2021-10-04 DIAGNOSIS — G894 Chronic pain syndrome: Secondary | ICD-10-CM | POA: Diagnosis not present

## 2021-10-04 DIAGNOSIS — J9601 Acute respiratory failure with hypoxia: Secondary | ICD-10-CM | POA: Diagnosis not present

## 2021-10-11 DIAGNOSIS — Z9981 Dependence on supplemental oxygen: Secondary | ICD-10-CM | POA: Diagnosis not present

## 2021-10-11 DIAGNOSIS — Z9359 Other cystostomy status: Secondary | ICD-10-CM | POA: Diagnosis not present

## 2021-10-11 DIAGNOSIS — Z515 Encounter for palliative care: Secondary | ICD-10-CM | POA: Diagnosis not present

## 2021-10-11 DIAGNOSIS — Z6832 Body mass index (BMI) 32.0-32.9, adult: Secondary | ICD-10-CM | POA: Diagnosis not present

## 2021-10-11 DIAGNOSIS — F0390 Unspecified dementia without behavioral disturbance: Secondary | ICD-10-CM | POA: Diagnosis not present

## 2021-10-11 DIAGNOSIS — J449 Chronic obstructive pulmonary disease, unspecified: Secondary | ICD-10-CM | POA: Diagnosis not present

## 2021-10-17 ENCOUNTER — Telehealth: Payer: Self-pay | Admitting: Family Medicine

## 2021-10-17 NOTE — Telephone Encounter (Signed)
Called and spoke w/ spouse. He reports since beginning of November, wife has lost ability to use legs as much as her normal baseline. Unable to stand even w/ assistance sometimes. Unable to walk now. She does not have use of arms/ they are very weak. Finished last of Lyrica she had 10/14/2021 (was taking 50mg  po qd). Wondering if she should continue this? Dr. Jannifer Franklin made mention of her coming off of this in past. Confirmed she is still taking Cymbalta.  Pharmacist was reviewing medication list yesterday and mentioned SE of weakness in extremities to him. She has been on this for about 3-4 months now. Wondering if she should come off of this or continue?   Last saw Dr. Jannifer Franklin 02/2021. Next visit scheduled for 12/25/21. Advised we may need to schedule sooner appt for re-evaluation/discuss treatment. He states it is hard to get her to office d/t needing to schedule medical transport which is expensive. Requesting mychart VV if MD ok with this. Confirmed they use mychart. Aware I will forward to MD to review and will call back to discuss next steps.  She was recently admitted to [facility] 09/15/21-09/17/21 as well.

## 2021-10-17 NOTE — Telephone Encounter (Signed)
Pt's husband is asking for a call to discuss pt coming off of :levETIRAcetam (KEPPRA) 250 MG tablet he does not feel like it is working for pt.

## 2021-10-18 NOTE — Telephone Encounter (Signed)
Called and spoke with pt's husband, they accepted VV appointment for 10/19/21.

## 2021-10-18 NOTE — Telephone Encounter (Signed)
Can you please call husband back and offer appt with Dr. Felecia Shelling tomorrow at 1030am as [facility]oris Ping VV? I have it on hold.  Dr. Felecia Shelling approved getting this scheduled. Thank you!

## 2021-10-19 ENCOUNTER — Telehealth (INDEPENDENT_AMBULATORY_CARE_PROVIDER_SITE_OTHER): Payer: PPO | Admitting: Neurology

## 2021-10-19 ENCOUNTER — Encounter: Payer: Self-pay | Admitting: Neurology

## 2021-10-19 DIAGNOSIS — G8222 Paraplegia, incomplete: Secondary | ICD-10-CM | POA: Diagnosis not present

## 2021-10-19 DIAGNOSIS — G40909 Epilepsy, unspecified, not intractable, without status epilepticus: Secondary | ICD-10-CM | POA: Diagnosis not present

## 2021-10-19 DIAGNOSIS — N319 Neuromuscular dysfunction of bladder, unspecified: Secondary | ICD-10-CM

## 2021-10-19 DIAGNOSIS — G0490 Encephalitis and encephalomyelitis, unspecified: Secondary | ICD-10-CM | POA: Diagnosis not present

## 2021-10-19 MED ORDER — QUETIAPINE FUMARATE 50 MG PO TABS
100.0000 mg | ORAL_TABLET | Freq: Every day | ORAL | 3 refills | Status: DC
Start: 2021-10-19 — End: 2021-11-27

## 2021-10-19 NOTE — Progress Notes (Addendum)
[facility]  PATIENT: Katelyn Lamb DOB: [birth year]  REFERRING DOCTOR OR PCP:  Dr. Dorthy Cooler. SOURCE: Patient, husband, records from Dr. Jannifer Franklin, imaging and lab reports, multiple MRI images personally reviewed.  _________________________________  Virtual Visit via Video Note I connected with Katelyn Lamb on 10/19/21 at 10:30 AM EST by a video enabled telemedicine application and verified that I am speaking with the correct person.  I discussed the limitations of evaluation and management by telemedicine and the availability of in person appointments. The patient expressed understanding and agreed to proceed.  Patient and husband were in their home.  Provider was in the office.  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 woman with h/o encephalomyelitis and chronic gait disorder and neurogenic bladder.  She was feeling weaker in November 2022 and went to the ED and was admitted   She was unable to help with transfers due to having more weakness.   She had a long hospital stay.     She has dysesthesias in her limbs.  Lyrica had helped.    Due to falls Lyrica was reduced.   She was placed on Keppra.   Her husband does not note benefit from the [place].   She is also on Duloxetine 60 mg po   She is bedbound or chair bound.   She sometimes helps with transfers and other times can't.  She has a suprapubic catheter.   No recent UTI.  She is taking prednisone 5 mg po qd.   This has seemed to hep her function better.   She takes Seroquel 100 mg po qHS and diazepam 5 mg bid (for tremors/shakiness).  Trying to reduce valium made these issues worse.        History of encephalitis     November 2017, she began developing headaches. She had a brain MRI w/o contrast done in November 2017 for this and this showed numerous supratentorial white matter abnormalities and an abnormality in the  pons. Then in December, she developed back pain and used a heating pad on her back and realized that she could not feel that heat. Shortly thereafter, she developed left abdominal pain. However, she traveled to [facility] to visit her friend and during the trip she developed numbness in her legs that progressed over a week or two. By the time she returned home in early January 2018, she was using a wheel chair in the airport and having difficulty walking. She went to the ER twice and was sent home. Around that time, she had a lumbar MRI that showed a protruding disc and initially, it was felt that her symptoms were due to that. However, she lost control of her bowl/bladder and developed a left leg rash and this brought her back to the ER. This time, she was admitted at [facility] from Jan 18- 24. She had an LP that showed 490 nucleated cells, 34 RBCs, 96 glucose and 53 protein. HSV2 PCR from the CSF was positive. Serum NMO negative, anca panel was negative. C/T spine MRI demonstrated longitudinally extensive T2 abnormalities and enhancement. Brain MRI had new lesions including frontal cortical ribboning on DWI with possible ADC correlate. She was diagnosed with HSV2 encephalomyelitis and treated with IV acyclovir and high dose steroids. She was discharged home with a pic line to continue acyclovir for 21 days and steroids were not continued. Within a couple of days following  discharge, she began having numbness,and she reports involuntary movements of her arms. She was readmitted to the hospital on Jan 20 and IV acyclovir was continued and she was restarted on steroids. She did better and went to inpatient rehab for 18 days and then was discharged home with home health PT/OT.   Due to fluctuations, she has been maintained on oral acyclovir.    IMAGING: MRI of the cervical spine 09/15/2021 shows spinal cord atrophy, T2 hyperintense signal in the posterior columns adjacent to C3-C4 and patchy signal below C6 posteriorly.  There  is moderate spinal stenosis at C5-C6 and milder spinal stenosis at C4-C5 and C6-C7.  This study was done without contrast.  MRI of the brain 09/15/2021 shows confluent T2 hyperintense abnormal signal predominantly in the frontal greater than parietal lobes extending from the periventricular to the subcortical white matter.  there is also abnormal signal within the pons.  No change compared to the previous MRI from July 2022.  MRI of the cervical spine with and without contrast 02/01/2020 shows spinal cord encephalomalacia adjacent to C3 to C4 and increased signal in a patchy manner in the lower cervical spinal cord.  Moderate spinal stenosis at C5-C6.  Normal enhancement pattern.  MRI of the brain 03/14/2018 showed T2/FLAIR hyperintense foci in the hemispheres and in the pons with large confluencies in the frontal lobes.  The right frontal lobe changes have increased when compared to the 12/25/2016 MRI while the changes elsewhere are more stable.  These findings are consistent with chronic microvascular ischemic changes with superimposed more recent right frontal findings that could be due to the history of encephalomyelitis or chronic stroke.  Atrophy had increased compared to the 12/25/2016 MRI.  Normal enhancement pattern.  MRI of the cervical spine 03/14/2018 showed a focus at the cervicomedullary junction, a larger posterior focus adjacent to C3-C4 and central/posterior focus at C6.  Moderately severe spinal stenosis at C5-C6 and mild spinal stenosis at C4-C5 and C6-C7.  MRI of the cervical spine 12/25/2016 shows an enhancing focus at C3-C4 posteriorly with nonenhancing foci at the cervicomedullary junction and at C6-C7.  Moderate spinal stenosis at C5-C6 and moderate spinal stenosis at C4-C5 and C6-C7.  MRI of the head 10/03/2016 showed patchy hyperintense foci in the hemispheres (significantly less than seen 2019 and later).  No enhancement.  Foci in the pons consistent with chronic microvascular ischemic  changes.  Ribbon cortical DWI changes  MRI of the cervical and thoracic spine showed a longitudinal extensive transverse myelitis from C2 through C4C5 associated with enlargement of spinal cord and other foci adjacent to C6-C7 and T1.  In the thoracic spine, additionally, there is increased signal from T5-T7.  These enhance after contrast.  PERTINENT LABS: -- CSF Analysis:  09/2016: 490 nucleated cells, 34 RBCs, 96 glucose and 53 protein. HSV2 PCR from the CSF was positive.   -- Other Studies:  09/2016- NMO AB neg.,ANCA panel neg, ANA neg,ENA neg, ACE neg  REVIEW OF SYSTEMS: Constitutional: No fevers, chills, sweats, or change in appetite Eyes: No visual changes, double vision, eye pain Ear, nose and throat: No hearing loss, ear pain, nasal congestion, sore throat Cardiovascular: No chest pain, palpitations Respiratory:  No shortness of breath at rest or with exertion.   No wheezes GastrointestinaI: No nausea, vomiting, diarrhea, abdominal pain, fecal incontinence Genitourinary:  .  Suprapubic catheter for urinary retention Musculoskeletal:  No neck pain, back pain Integumentary: No rash, pruritus, skin lesions Neurological: as above Psychiatric: No depression at this  time.  No anxiety Endocrine: No palpitations, diaphoresis, change in appetite, change in weigh or increased thirst Hematologic/Lymphatic:  No anemia, purpura, petechiae. Allergic/Immunologic: No itchy/runny eyes, nasal congestion, recent allergic reactions, rashes  ALLERGIES: Allergies  Allergen Reactions   Demerol [Meperidine] Other (See Comments)    Hallucinations   Percocet [Oxycodone-Acetaminophen] Itching   Amoxicillin-Pot Clavulanate Diarrhea    Severe pain, headache, intestinal infection   Penicillins Itching and Rash    Tolerated amoxicillin November 2022  Has patient had a PCN reaction causing immediate rash, facial/tongue/throat swelling, SOB or lightheadedness with hypotension:  NO Has patient had a PCN  reaction causing severe rash involving mucus membranes or skin necrosis: No Has patient had a PCN reaction that required hospitalization: No Has patient had a PCN reaction occurring within the last 10 years: Yes If all of the above answers are "NO", then may proceed with Cephalosporin use.    HOME MEDICATIONS:  Current Outpatient Medications:    acyclovir (ZOVIRAX) 400 MG tablet, TAKE 1 TABLET BY MOUTH TWICE A DAY (Patient taking differently: Take 400 mg by mouth 2 (two) times daily.), Disp: 180 tablet, Rfl: 1   albuterol (PROVENTIL) (2.5 MG/3ML) 0.083% nebulizer solution, Take 3 mLs (2.5 mg total) by nebulization every 2 (two) hours as needed for wheezing or shortness of breath., Disp: 75 mL, Rfl: 12   apixaban (ELIQUIS) 5 MG TABS tablet, Take 1 tablet (5 mg total) by mouth 2 (two) times daily., Disp: 60 tablet, Rfl:    Ascorbic Acid (VITAMIN C) 500 MG CHEW, Chew 500 mg by mouth daily., Disp: , Rfl:    Calcium Carb-Cholecalciferol (CALCIUM 600 + D PO), Take 1 tablet by mouth in the morning and at bedtime., Disp: , Rfl:    Cyanocobalamin (VITAMIN B-12 PO), Take 500 mcg by mouth daily., Disp: , Rfl:    diazepam (VALIUM) 5 MG tablet, Take 1 tablet (5 mg total) by mouth every 12 (twelve) hours as needed (tremors)., Disp: 180 tablet, Rfl: 0   docusate sodium (COLACE) 100 MG capsule, Take 200 mg by mouth daily as needed for mild constipation., Disp: , Rfl:    DULoxetine (CYMBALTA) 60 MG capsule, Take 1 capsule (60 mg total) by mouth at bedtime., Disp: 30 capsule, Rfl:    feeding supplement (ENSURE ENLIVE / ENSURE PLUS) LIQD, Take 237 mLs by mouth 2 (two) times daily between meals., Disp: 237 mL, Rfl: 12   ferrous sulfate 325 (65 FE) MG tablet, Take 325 mg by mouth every morning., Disp: , Rfl:    guaiFENesin (MUCINEX) 600 MG 12 hr tablet, Take 2 tablets (1,200 mg total) by mouth 2 (two) times daily., Disp: , Rfl:    HYDROcodone-acetaminophen (NORCO) 10-325 MG tablet, Take 1 tablet by mouth 3 (three)  times daily as needed., Disp: , Rfl:    loperamide (IMODIUM) 2 MG capsule, Take 2 mg by mouth daily as needed for diarrhea or loose stools., Disp: , Rfl:    midodrine (PROAMATINE) 5 MG tablet, Take 1 tablet (5 mg total) by mouth 3 (three) times daily with meals., Disp: 90 tablet, Rfl: 1   mirabegron ER (MYRBETRIQ) 50 MG TB24 tablet, Take 50 mg by mouth at bedtime., Disp: , Rfl:    omega-3 acid ethyl esters (LOVAZA) 1 g capsule, Take 1 g by mouth every morning., Disp: , Rfl:    pantoprazole (PROTONIX) 40 MG tablet, Take 1 tablet (40 mg total) by mouth daily., Disp: 30 tablet, Rfl: 1   predniSONE (DELTASONE) 5 MG tablet, TAKE  1 TABLET BY MOUTH EVERY DAY WITH BREAKFAST (Patient taking differently: Take 5 mg by mouth daily with breakfast.), Disp: 90 tablet, Rfl: 1   pregabalin (LYRICA) 50 MG capsule, Take 2 capsules (100 mg total) by mouth at bedtime. (Patient taking differently: Take 150 mg by mouth at bedtime.), Disp: 180 capsule, Rfl: 1   QUEtiapine (SEROQUEL) 50 MG tablet, Take 2 tablets (100 mg total) by mouth at bedtime., Disp: 90 tablet, Rfl: 3   sodium chloride (OCEAN) 0.65 % SOLN nasal spray, Place 1 spray into both nostrils as needed for congestion., Disp: 30 mL, Rfl: 0  PAST MEDICAL HISTORY: Past Medical History:  Diagnosis Date   Acute deep vein thrombosis (DVT) of popliteal vein of left lower extremity (HCC)    Anxiety    Back pain    Chronic kidney disease    Encephalomyelitis    Gait abnormality 11/14/2016   Memory difficulty 03/02/2019   Myelitis due to herpes simplex ([place])    Neurogenic bladder     PAST SURGICAL HISTORY: Past Surgical History:  Procedure Laterality Date   ABDOMINAL HYSTERECTOMY     BLADDER REPAIR     CESAREAN SECTION     IR ANGIO EXTERNAL CAROTID SEL EXT CAROTID BILAT MOD SED  07/18/2021   IR ANGIO INTRA EXTRACRAN SEL INTERNAL CAROTID BILAT MOD SED  07/18/2021   IR KYPHO LUMBAR INC FX REDUCE BONE BX UNI/BIL CANNULATION INC/IMAGING  04/11/2018   IR NEURO  EACH ADD'L AFTER BASIC UNI LEFT (MS)  07/18/2021   IR NEURO EACH ADD'L AFTER BASIC UNI RIGHT (MS)  07/18/2021   IR TRANSCATH/EMBOLIZ  07/18/2021   IR US GUIDE VASC ACCESS RIGHT  07/18/2021   RADIOLOGY WITH ANESTHESIA N/A 07/18/2021   Procedure: IR WITH ANESTHESIA;  Surgeon: Pedro Earls, MD;  Location: [facility];  Service: Radiology;  Laterality: N/A;   TUBAL LIGATION      FAMILY HISTORY: Family History  Problem Relation Age of Onset   Hypertension Mother     SOCIAL HISTORY:  Social History   Socioeconomic History   Marital status: Married    Spouse name: [name]   Number of children: 2   Years of education: 12   Highest education level: Not on file  Occupational History   Occupation: retired  Tobacco Use   Smoking status: Former    Packs/day: 2.00    Years: 20.00    Pack years: 40.00    Types: Cigarettes   Smokeless tobacco: Never   Tobacco comments:    40 years ago   Vaping Use   Vaping Use: Never used  Substance and Sexual Activity   Alcohol use: No   Drug use: Never   Sexual activity: Not Currently  Other Topics Concern   Not on file  Social History Narrative   Lives with husband   Caffeine 2-3 cups daily   Right-handed   Social Determinants of Health   Financial Resource Strain: Not on file  Food Insecurity: Not on file  Transportation Needs: Not on file  Physical Activity: Not on file  Stress: Not on file  Social Connections: Not on file  Intimate Partner Violence: Not on file     PHYSICAL EXAM  She is a bedbound well-developed well-nourished woman in no acute distress.  The head is normocephalic and atraumatic.  Sclera are anicteric.  Visible skin appears normal.  The neck has a good range of motion.  Facial strength seems symmetric.    DIAGNOSTIC DATA (LABS, IMAGING,  TESTING) - I reviewed patient records, labs, notes, testing and imaging myself where available.  Lab Results  Component Value Date   WBC 10.2 09/15/2021   HGB  12.6 09/15/2021   HCT 37.0 09/15/2021   MCV 96.9 09/15/2021   PLT 249 09/15/2021      Component Value Date/Time   NA 139 09/15/2021 0523   NA 137 03/02/2019 1612   K 4.1 09/15/2021 0523   CL 101 09/15/2021 0523   CO2 27 09/15/2021 0503   GLUCOSE 87 09/15/2021 0523   BUN 19 09/15/2021 0523   BUN 8 03/02/2019 1612   CREATININE 0.80 09/15/2021 0523   CREATININE 0.80 01/08/2017 1449   CALCIUM 8.7 (L) 09/15/2021 0503   PROT 6.6 09/15/2021 0503   PROT 6.8 03/02/2019 1612   ALBUMIN 3.5 09/15/2021 0503   ALBUMIN 4.2 03/02/2019 1612   AST 28 09/15/2021 0503   ALT 25 09/15/2021 0503   ALKPHOS 68 09/15/2021 0503   BILITOT 0.5 09/15/2021 0503   BILITOT 0.2 03/02/2019 1612   GFRNONAA >60 09/15/2021 0503   GFRAA >60 02/17/2020 0453   Lab Results  Component Value Date   CHOL 150 09/16/2021   HDL 48 09/16/2021   LDLCALC 76 09/16/2021   TRIG 130 09/16/2021   CHOLHDL 3.1 09/16/2021   Lab Results  Component Value Date   HGBA1C 5.9 (H) 03/15/2021   Lab Results  Component Value Date   VITAMINB12 2,030 (H) 03/15/2021   Lab Results  Component Value Date   TSH 1.817 03/15/2021       ASSESSMENT AND PLAN  Encephalomyelitis  Seizure disorder ([place])  Incomplete paraplegia (HCC)  Neurogenic bladder   Stop Keppra.  Restart Lyrica 50 mg po bid (afternoon and night).   Continue acyclovir and 5 mg daily prednisone..  Reduce Seroquel to 50 mg at night Continue diazepam for spasticity.  Suprapubic catheter managed by urology. Return in 6 months or sooner if there are new or worsening neurologic symptoms.    Follow Up Instructions: I discussed the assessment and treatment plan with the patient. The patient was provided an opportunity to ask questions and all were answered. The patient agreed with the plan and demonstrated an understanding of the instructions.    The patient was advised to call back or seek an in-person evaluation if the symptoms worsen or if the condition fails to  improve as anticipated.   50-minute office visit with the majority of the time spent f on video for history and physical, discussion/counseling and decision-making.  Additional time with extensive record review including multiple MRIs personally reviewed and documentation.   Daryon Remmert A. Felecia Shelling, MD, [facility] 11/24/5571, 2:20 PM Certified in Neurology, Clinical Neurophysiology, Sleep Medicine and Neuroimaging  [facility] [address], [place], [place] [address] [phone]

## 2021-10-25 DIAGNOSIS — R339 Retention of urine, unspecified: Secondary | ICD-10-CM | POA: Diagnosis not present

## 2021-10-25 DIAGNOSIS — M4802 Spinal stenosis, cervical region: Secondary | ICD-10-CM | POA: Diagnosis not present

## 2021-10-25 DIAGNOSIS — G8222 Paraplegia, incomplete: Secondary | ICD-10-CM | POA: Diagnosis not present

## 2021-10-25 DIAGNOSIS — N319 Neuromuscular dysfunction of bladder, unspecified: Secondary | ICD-10-CM | POA: Diagnosis not present

## 2021-10-25 DIAGNOSIS — I700 Atherosclerosis of aorta: Secondary | ICD-10-CM | POA: Diagnosis not present

## 2021-10-25 DIAGNOSIS — M810 Age-related osteoporosis without current pathological fracture: Secondary | ICD-10-CM | POA: Diagnosis not present

## 2021-10-25 DIAGNOSIS — I82441 Acute embolism and thrombosis of right tibial vein: Secondary | ICD-10-CM | POA: Diagnosis not present

## 2021-10-25 DIAGNOSIS — M47812 Spondylosis without myelopathy or radiculopathy, cervical region: Secondary | ICD-10-CM | POA: Diagnosis not present

## 2021-10-25 DIAGNOSIS — J168 Pneumonia due to other specified infectious organisms: Secondary | ICD-10-CM | POA: Diagnosis not present

## 2021-10-25 DIAGNOSIS — M47814 Spondylosis without myelopathy or radiculopathy, thoracic region: Secondary | ICD-10-CM | POA: Diagnosis not present

## 2021-10-25 DIAGNOSIS — M170 Bilateral primary osteoarthritis of knee: Secondary | ICD-10-CM | POA: Diagnosis not present

## 2021-10-25 DIAGNOSIS — I2699 Other pulmonary embolism without acute cor pulmonale: Secondary | ICD-10-CM | POA: Diagnosis not present

## 2021-10-25 DIAGNOSIS — N189 Chronic kidney disease, unspecified: Secondary | ICD-10-CM | POA: Diagnosis not present

## 2021-10-25 DIAGNOSIS — Z466 Encounter for fitting and adjustment of urinary device: Secondary | ICD-10-CM | POA: Diagnosis not present

## 2021-10-25 DIAGNOSIS — J9601 Acute respiratory failure with hypoxia: Secondary | ICD-10-CM | POA: Diagnosis not present

## 2021-10-30 DIAGNOSIS — J209 Acute bronchitis, unspecified: Secondary | ICD-10-CM | POA: Diagnosis not present

## 2021-10-30 DIAGNOSIS — J449 Chronic obstructive pulmonary disease, unspecified: Secondary | ICD-10-CM | POA: Diagnosis not present

## 2021-10-30 DIAGNOSIS — Z9359 Other cystostomy status: Secondary | ICD-10-CM | POA: Diagnosis not present

## 2021-11-01 DIAGNOSIS — J9601 Acute respiratory failure with hypoxia: Secondary | ICD-10-CM | POA: Diagnosis not present

## 2021-11-01 DIAGNOSIS — N319 Neuromuscular dysfunction of bladder, unspecified: Secondary | ICD-10-CM | POA: Diagnosis not present

## 2021-11-01 DIAGNOSIS — E46 Unspecified protein-calorie malnutrition: Secondary | ICD-10-CM | POA: Diagnosis not present

## 2021-11-01 DIAGNOSIS — M47812 Spondylosis without myelopathy or radiculopathy, cervical region: Secondary | ICD-10-CM | POA: Diagnosis not present

## 2021-11-01 DIAGNOSIS — N189 Chronic kidney disease, unspecified: Secondary | ICD-10-CM | POA: Diagnosis not present

## 2021-11-01 DIAGNOSIS — M47814 Spondylosis without myelopathy or radiculopathy, thoracic region: Secondary | ICD-10-CM | POA: Diagnosis not present

## 2021-11-01 DIAGNOSIS — M4802 Spinal stenosis, cervical region: Secondary | ICD-10-CM | POA: Diagnosis not present

## 2021-11-01 DIAGNOSIS — G2581 Restless legs syndrome: Secondary | ICD-10-CM | POA: Diagnosis not present

## 2021-11-01 DIAGNOSIS — G40909 Epilepsy, unspecified, not intractable, without status epilepticus: Secondary | ICD-10-CM | POA: Diagnosis not present

## 2021-11-01 DIAGNOSIS — I2699 Other pulmonary embolism without acute cor pulmonale: Secondary | ICD-10-CM | POA: Diagnosis not present

## 2021-11-01 DIAGNOSIS — G8222 Paraplegia, incomplete: Secondary | ICD-10-CM | POA: Diagnosis not present

## 2021-11-01 DIAGNOSIS — M170 Bilateral primary osteoarthritis of knee: Secondary | ICD-10-CM | POA: Diagnosis not present

## 2021-11-01 DIAGNOSIS — G894 Chronic pain syndrome: Secondary | ICD-10-CM | POA: Diagnosis not present

## 2021-11-01 DIAGNOSIS — K219 Gastro-esophageal reflux disease without esophagitis: Secondary | ICD-10-CM | POA: Diagnosis not present

## 2021-11-01 DIAGNOSIS — F411 Generalized anxiety disorder: Secondary | ICD-10-CM | POA: Diagnosis not present

## 2021-11-01 DIAGNOSIS — M810 Age-related osteoporosis without current pathological fracture: Secondary | ICD-10-CM | POA: Diagnosis not present

## 2021-11-01 DIAGNOSIS — I82441 Acute embolism and thrombosis of right tibial vein: Secondary | ICD-10-CM | POA: Diagnosis not present

## 2021-11-01 DIAGNOSIS — Z466 Encounter for fitting and adjustment of urinary device: Secondary | ICD-10-CM | POA: Diagnosis not present

## 2021-11-01 DIAGNOSIS — Z7901 Long term (current) use of anticoagulants: Secondary | ICD-10-CM | POA: Diagnosis not present

## 2021-11-01 DIAGNOSIS — F5101 Primary insomnia: Secondary | ICD-10-CM | POA: Diagnosis not present

## 2021-11-01 DIAGNOSIS — R7303 Prediabetes: Secondary | ICD-10-CM | POA: Diagnosis not present

## 2021-11-01 DIAGNOSIS — I700 Atherosclerosis of aorta: Secondary | ICD-10-CM | POA: Diagnosis not present

## 2021-11-01 DIAGNOSIS — Z7952 Long term (current) use of systemic steroids: Secondary | ICD-10-CM | POA: Diagnosis not present

## 2021-11-01 DIAGNOSIS — F331 Major depressive disorder, recurrent, moderate: Secondary | ICD-10-CM | POA: Diagnosis not present

## 2021-11-09 ENCOUNTER — Other Ambulatory Visit: Payer: Self-pay

## 2021-11-09 ENCOUNTER — Emergency Department (HOSPITAL_COMMUNITY)
Admission: EM | Admit: 2021-11-09 | Discharge: 2021-11-10 | Disposition: A | Discharge status: Home / Self Care | Payer: PPO | Attending: Emergency Medicine | Admitting: Emergency Medicine

## 2021-11-09 ENCOUNTER — Other Ambulatory Visit: Payer: Self-pay | Admitting: *Deleted

## 2021-11-09 ENCOUNTER — Encounter (HOSPITAL_COMMUNITY): Payer: Self-pay

## 2021-11-09 DIAGNOSIS — E7800 Pure hypercholesterolemia, unspecified: Secondary | ICD-10-CM | POA: Insufficient documentation

## 2021-11-09 DIAGNOSIS — R0902 Hypoxemia: Secondary | ICD-10-CM | POA: Diagnosis not present

## 2021-11-09 DIAGNOSIS — Z466 Encounter for fitting and adjustment of urinary device: Secondary | ICD-10-CM | POA: Diagnosis not present

## 2021-11-09 DIAGNOSIS — M810 Age-related osteoporosis without current pathological fracture: Secondary | ICD-10-CM | POA: Insufficient documentation

## 2021-11-09 DIAGNOSIS — N3289 Other specified disorders of bladder: Secondary | ICD-10-CM | POA: Diagnosis not present

## 2021-11-09 DIAGNOSIS — G8222 Paraplegia, incomplete: Secondary | ICD-10-CM

## 2021-11-09 DIAGNOSIS — F5101 Primary insomnia: Secondary | ICD-10-CM | POA: Insufficient documentation

## 2021-11-09 DIAGNOSIS — I2699 Other pulmonary embolism without acute cor pulmonale: Secondary | ICD-10-CM | POA: Diagnosis not present

## 2021-11-09 DIAGNOSIS — I82441 Acute embolism and thrombosis of right tibial vein: Secondary | ICD-10-CM | POA: Diagnosis not present

## 2021-11-09 DIAGNOSIS — N319 Neuromuscular dysfunction of bladder, unspecified: Secondary | ICD-10-CM | POA: Diagnosis not present

## 2021-11-09 DIAGNOSIS — G0490 Encephalitis and encephalomyelitis, unspecified: Secondary | ICD-10-CM

## 2021-11-09 DIAGNOSIS — I700 Atherosclerosis of aorta: Secondary | ICD-10-CM | POA: Diagnosis not present

## 2021-11-09 DIAGNOSIS — J9601 Acute respiratory failure with hypoxia: Secondary | ICD-10-CM | POA: Diagnosis not present

## 2021-11-09 DIAGNOSIS — F331 Major depressive disorder, recurrent, moderate: Secondary | ICD-10-CM | POA: Insufficient documentation

## 2021-11-09 MED ORDER — ACYCLOVIR 400 MG PO TABS: 400.0000 mg | ORAL_TABLET | Freq: Two times a day (BID) | ORAL | 1 refills | Status: DC

## 2021-11-09 NOTE — Discharge Instructions (Signed)
There were no complications found with the urinary bladder or suprapubic catheter.  The urine is draining normally.  She probably had some bladder spasm, contributing to her discomfort following the blocked catheter episode.  Continue current treatment and care plan.  Follow-up with your doctor as needed for problems. ?

## 2021-11-09 NOTE — ED Provider Notes (Signed)
[facility]-EMERGENCY DEPT Provider Note   CSN: [id number] Arrival date & time: 11/09/21  1837     History  Chief Complaint  Patient presents with   Suprapubic problem    Katelyn Lamb is a 76 y.o. female.  HPI Patient presents from home, for leakage around her suprapubic catheter.  She is unable to give any other history.    Home Medications Prior to Admission medications   Medication Sig Start Date End Date Taking? Authorizing Provider  acyclovir (ZOVIRAX) 400 MG tablet Take 1 tablet (400 mg total) by mouth 2 (two) times daily. 11/09/21   Sater, Pearletha Furl, MD  albuterol (PROVENTIL) (2.5 MG/3ML) 0.083% nebulizer solution Take 3 mLs (2.5 mg total) by nebulization every 2 (two) hours as needed for wheezing or shortness of breath. 08/15/21   Ghimire, Werner Lean, MD  apixaban (ELIQUIS) 5 MG TABS tablet Take 1 tablet (5 mg total) by mouth 2 (two) times daily. 08/15/21   Ghimire, Werner Lean, MD  Ascorbic Acid (VITAMIN C) 500 MG CHEW Chew 500 mg by mouth daily.    [provider]  Calcium Carb-Cholecalciferol (CALCIUM 600 + D PO) Take 1 tablet by mouth in the morning and at bedtime.    [provider]  Cyanocobalamin (VITAMIN B-12 PO) Take 500 mcg by mouth daily.    [provider]  diazepam (VALIUM) 5 MG tablet Take 1 tablet (5 mg total) by mouth every 12 (twelve) hours as needed (tremors). 09/28/21   Sater, Pearletha Furl, MD  docusate sodium (COLACE) 100 MG capsule Take 200 mg by mouth daily as needed for mild constipation.    [provider]  DULoxetine (CYMBALTA) 60 MG capsule Take 1 capsule (60 mg total) by mouth at bedtime. 08/01/21   Meredeth Ide, MD  feeding supplement (ENSURE ENLIVE / ENSURE PLUS) LIQD Take 237 mLs by mouth 2 (two) times daily between meals. 08/15/21   Ghimire, Werner Lean, MD  ferrous sulfate 325 (65 FE) MG tablet Take 325 mg by mouth every morning. 03/10/21   [provider]  guaiFENesin (MUCINEX) 600 MG 12 hr  tablet Take 2 tablets (1,200 mg total) by mouth 2 (two) times daily. 08/15/21   Ghimire, Werner Lean, MD  HYDROcodone-acetaminophen (NORCO) 10-325 MG tablet Take 1 tablet by mouth 3 (three) times daily as needed. 08/24/21   [provider]  loperamide (IMODIUM) 2 MG capsule Take 2 mg by mouth daily as needed for diarrhea or loose stools.    [provider]  midodrine (PROAMATINE) 5 MG tablet Take 1 tablet (5 mg total) by mouth 3 (three) times daily with meals. 09/17/21   Burnadette Pop, MD  mirabegron ER (MYRBETRIQ) 50 MG TB24 tablet Take 50 mg by mouth at bedtime.    [provider]  omega-3 acid ethyl esters (LOVAZA) 1 g capsule Take 1 g by mouth every morning.    [provider]  pantoprazole (PROTONIX) 40 MG tablet Take 1 tablet (40 mg total) by mouth daily. 10/26/16   Angiulli, Mcarthur Rossetti, PA-C  predniSONE (DELTASONE) 5 MG tablet TAKE 1 TABLET BY MOUTH EVERY DAY WITH BREAKFAST Patient taking differently: Take 5 mg by mouth daily with breakfast. 03/27/21   York Spaniel, MD  pregabalin (LYRICA) 50 MG capsule Take 2 capsules (100 mg total) by mouth at bedtime. Patient taking differently: Take 150 mg by mouth at bedtime. 02/09/21   York Spaniel, MD  QUEtiapine (SEROQUEL) 50 MG tablet Take 2 tablets (100  mg total) by mouth at bedtime. 10/19/21   Sater, Pearletha Furl, MD  sodium chloride (OCEAN) 0.65 % SOLN nasal spray Place 1 spray into both nostrils as needed for congestion. 08/01/21   Meredeth Ide, MD      Allergies    Demerol [meperidine], Percocet [oxycodone-acetaminophen], Amoxicillin-pot clavulanate, and Penicillins    Review of Systems   Review of Systems  Physical Exam Updated Vital Signs BP 116/77   Pulse 90   Temp 98.8 F (37.1 C) (Oral)   Resp 18   Ht 5\' 6"  (1.676 m)   Wt 72.6 kg   SpO2 97%   BMI 25.82 kg/m  Physical Exam Vitals and nursing note reviewed.  Constitutional:      General: She is not in acute distress.    Appearance: She  is well-developed. She is obese. She is not ill-appearing or diaphoretic.  HENT:     Head: Normocephalic and atraumatic.     Right Ear: External ear normal.     Left Ear: External ear normal.  Eyes:     Conjunctiva/sclera: Conjunctivae normal.     Pupils: Pupils are equal, round, and reactive to light.  Neck:     Trachea: Phonation normal.  Cardiovascular:     Rate and Rhythm: Normal rate.  Pulmonary:     Effort: Pulmonary effort is normal.     Comments: Occasional nonproductive congested cough.  No increased work of breathing.  Normal oxygen saturation on room air, 95%. Abdominal:     General: There is no distension.     Palpations: Abdomen is soft.     Tenderness: There is no abdominal tenderness.     Comments: Suprapubic catheter, present in the low mid abdomen.  Site implants appear normal.  No leakage or bleeding around the suprapubic catheter.  Perineum is dry.  Musculoskeletal:        General: Normal range of motion.     Cervical back: Normal range of motion and neck supple.  Skin:    General: Skin is warm and dry.  Neurological:     Mental Status: She is alert and oriented to person, place, and time.     Cranial Nerves: No cranial nerve deficit.     Sensory: No sensory deficit.     Motor: No abnormal muscle tone.     Coordination: Coordination normal.  Psychiatric:        Behavior: Behavior normal.        Thought Content: Thought content normal.        Judgment: Judgment normal.    ED Results / Procedures / Treatments   Labs (all labs ordered are listed, but only abnormal results are displayed) Labs Reviewed - No data to display  EKG None  Radiology No results found.  Procedures Procedures    Medications Ordered in ED Medications - No data to display  ED Course/ Medical Decision Making/ A&P                           Medical Decision Making Patient presenting with leakage around her suprapubic catheter which occurred today while at home.  Differential  diagnosis includes malplaced catheter, urinary tract infection, bladder spasm.  Problems Addressed: Bladder spasm: self-limited or minor problem    Details: No recurrence, during evaluation.  Amount and/or Complexity of Data Reviewed Independent Historian: caregiver    Details: Patient's husband arrived and gave additional history.  Yesterday she had routine replacement of her  suprapubic catheter.  After that she began passing urine through her urethra.  The catheter did not drain so staff came back to the home and irrigated the catheter.  This seemed to help.  She had no more drainage of urine from the urethra.  Today her caregiver noticed that she had urine soaked clothing, unclear if it passed from the urethra or the suprapubic catheter region.  She has otherwise been ill with a cough for about 10 days which was treated with antibiotics and prednisone.  She is on chronic prednisone therapy.  She is debilitated from encephalopathy, and has had a general turn for the worse, with weakness over the last 6 weeks.  She gets care at home, with assistance by husband.  She typically needs help to transfer from bed to wheelchair to mobilize herself.  There have been no other recent illnesses. ECG/medicine tests: ordered and independent interpretation performed.    Details: Urinary bladder scan, less than 20 cc of urine indicating functioning catheter system.  Risk Decision regarding hospitalization. Risk Details: Ms.Patient presenting with probable misplaced versus blocked suprapubic catheter.  At the time she is seen in the urine she is draining clear urine, from the suprapubic catheter.  Urinary catheter bag has about 400 cc of urine in it.  Urine is yellow in color.  Patient has normal oxygen saturation, normal vital signs, and occasional cough during exam.  She is not showing any signs of acute bacterial infection, sepsis or metabolic disorders.  Blood pressure and vital signs are normal.  There is no  indication for further ED evaluation for this problem.  I doubt urinary tract infection.  I suspect that she had urinary bladder spasm causing the leakage of urine.  She is stable for discharge.           Final Clinical Impression(s) / ED Diagnoses Final diagnoses:  Bladder spasm    Rx / DC Orders ED Discharge Orders     None         Mancel Bale, MD 11/09/21 2045

## 2021-11-09 NOTE — ED Notes (Signed)
Patients husband said that a ua was collected and sent to [facility] this evening. ?

## 2021-11-09 NOTE — ED Triage Notes (Signed)
Patient brought in via ems from home. Patient states her suprapubic cath was changed this morning and states that it is leaking around insertion site. Denies N/V/D or any pain  ?

## 2021-11-10 DIAGNOSIS — R3989 Other symptoms and signs involving the genitourinary system: Secondary | ICD-10-CM | POA: Diagnosis not present

## 2021-11-10 DIAGNOSIS — N319 Neuromuscular dysfunction of bladder, unspecified: Secondary | ICD-10-CM | POA: Diagnosis not present

## 2021-11-10 DIAGNOSIS — M2550 Pain in unspecified joint: Secondary | ICD-10-CM | POA: Diagnosis not present

## 2021-11-10 DIAGNOSIS — R339 Retention of urine, unspecified: Secondary | ICD-10-CM | POA: Diagnosis not present

## 2021-11-10 DIAGNOSIS — Z466 Encounter for fitting and adjustment of urinary device: Secondary | ICD-10-CM | POA: Diagnosis not present

## 2021-11-10 DIAGNOSIS — Z7401 Bed confinement status: Secondary | ICD-10-CM | POA: Diagnosis not present

## 2021-11-22 ENCOUNTER — Other Ambulatory Visit: Payer: Self-pay

## 2021-11-22 DIAGNOSIS — J9601 Acute respiratory failure with hypoxia: Secondary | ICD-10-CM | POA: Diagnosis not present

## 2021-11-22 DIAGNOSIS — G8222 Paraplegia, incomplete: Secondary | ICD-10-CM | POA: Diagnosis not present

## 2021-11-22 DIAGNOSIS — J168 Pneumonia due to other specified infectious organisms: Secondary | ICD-10-CM | POA: Diagnosis not present

## 2021-11-22 MED ORDER — PREDNISONE 5 MG PO TABS: 5.0000 mg | ORAL_TABLET | Freq: Every day | ORAL | 2 refills | Status: DC

## 2021-11-23 DIAGNOSIS — R059 Cough, unspecified: Secondary | ICD-10-CM | POA: Diagnosis not present

## 2021-11-23 DIAGNOSIS — Z7409 Other reduced mobility: Secondary | ICD-10-CM | POA: Diagnosis not present

## 2021-11-24 DIAGNOSIS — R059 Cough, unspecified: Secondary | ICD-10-CM | POA: Diagnosis not present

## 2021-11-25 ENCOUNTER — Other Ambulatory Visit: Payer: Self-pay | Admitting: Neurology

## 2021-11-27 DIAGNOSIS — R059 Cough, unspecified: Secondary | ICD-10-CM | POA: Diagnosis not present

## 2021-11-28 DIAGNOSIS — R059 Cough, unspecified: Secondary | ICD-10-CM | POA: Diagnosis not present

## 2021-12-03 DIAGNOSIS — R339 Retention of urine, unspecified: Secondary | ICD-10-CM | POA: Diagnosis not present

## 2021-12-14 DIAGNOSIS — N319 Neuromuscular dysfunction of bladder, unspecified: Secondary | ICD-10-CM | POA: Diagnosis not present

## 2021-12-14 DIAGNOSIS — K219 Gastro-esophageal reflux disease without esophagitis: Secondary | ICD-10-CM | POA: Diagnosis not present

## 2021-12-14 DIAGNOSIS — N189 Chronic kidney disease, unspecified: Secondary | ICD-10-CM | POA: Diagnosis not present

## 2021-12-14 DIAGNOSIS — M47814 Spondylosis without myelopathy or radiculopathy, thoracic region: Secondary | ICD-10-CM | POA: Diagnosis not present

## 2021-12-14 DIAGNOSIS — G2581 Restless legs syndrome: Secondary | ICD-10-CM | POA: Diagnosis not present

## 2021-12-14 DIAGNOSIS — M170 Bilateral primary osteoarthritis of knee: Secondary | ICD-10-CM | POA: Diagnosis not present

## 2021-12-14 DIAGNOSIS — Z7952 Long term (current) use of systemic steroids: Secondary | ICD-10-CM | POA: Diagnosis not present

## 2021-12-14 DIAGNOSIS — J9601 Acute respiratory failure with hypoxia: Secondary | ICD-10-CM | POA: Diagnosis not present

## 2021-12-14 DIAGNOSIS — I700 Atherosclerosis of aorta: Secondary | ICD-10-CM | POA: Diagnosis not present

## 2021-12-14 DIAGNOSIS — G894 Chronic pain syndrome: Secondary | ICD-10-CM | POA: Diagnosis not present

## 2021-12-14 DIAGNOSIS — E46 Unspecified protein-calorie malnutrition: Secondary | ICD-10-CM | POA: Diagnosis not present

## 2021-12-14 DIAGNOSIS — F411 Generalized anxiety disorder: Secondary | ICD-10-CM | POA: Diagnosis not present

## 2021-12-14 DIAGNOSIS — I82441 Acute embolism and thrombosis of right tibial vein: Secondary | ICD-10-CM | POA: Diagnosis not present

## 2021-12-14 DIAGNOSIS — F331 Major depressive disorder, recurrent, moderate: Secondary | ICD-10-CM | POA: Diagnosis not present

## 2021-12-14 DIAGNOSIS — I2699 Other pulmonary embolism without acute cor pulmonale: Secondary | ICD-10-CM | POA: Diagnosis not present

## 2021-12-14 DIAGNOSIS — G40909 Epilepsy, unspecified, not intractable, without status epilepticus: Secondary | ICD-10-CM | POA: Diagnosis not present

## 2021-12-14 DIAGNOSIS — R7303 Prediabetes: Secondary | ICD-10-CM | POA: Diagnosis not present

## 2021-12-14 DIAGNOSIS — M47812 Spondylosis without myelopathy or radiculopathy, cervical region: Secondary | ICD-10-CM | POA: Diagnosis not present

## 2021-12-14 DIAGNOSIS — Z466 Encounter for fitting and adjustment of urinary device: Secondary | ICD-10-CM | POA: Diagnosis not present

## 2021-12-14 DIAGNOSIS — Z7901 Long term (current) use of anticoagulants: Secondary | ICD-10-CM | POA: Diagnosis not present

## 2021-12-14 DIAGNOSIS — F5101 Primary insomnia: Secondary | ICD-10-CM | POA: Diagnosis not present

## 2021-12-14 DIAGNOSIS — M4802 Spinal stenosis, cervical region: Secondary | ICD-10-CM | POA: Diagnosis not present

## 2021-12-14 DIAGNOSIS — G8222 Paraplegia, incomplete: Secondary | ICD-10-CM | POA: Diagnosis not present

## 2021-12-14 DIAGNOSIS — M810 Age-related osteoporosis without current pathological fracture: Secondary | ICD-10-CM | POA: Diagnosis not present

## 2021-12-22 ENCOUNTER — Other Ambulatory Visit: Payer: Self-pay | Admitting: Neurology

## 2021-12-23 DIAGNOSIS — J9601 Acute respiratory failure with hypoxia: Secondary | ICD-10-CM | POA: Diagnosis not present

## 2021-12-23 DIAGNOSIS — G8222 Paraplegia, incomplete: Secondary | ICD-10-CM | POA: Diagnosis not present

## 2021-12-23 DIAGNOSIS — J168 Pneumonia due to other specified infectious organisms: Secondary | ICD-10-CM | POA: Diagnosis not present

## 2021-12-24 DIAGNOSIS — M47814 Spondylosis without myelopathy or radiculopathy, thoracic region: Secondary | ICD-10-CM | POA: Diagnosis not present

## 2021-12-24 DIAGNOSIS — N189 Chronic kidney disease, unspecified: Secondary | ICD-10-CM | POA: Diagnosis not present

## 2021-12-24 DIAGNOSIS — I82441 Acute embolism and thrombosis of right tibial vein: Secondary | ICD-10-CM | POA: Diagnosis not present

## 2021-12-24 DIAGNOSIS — I700 Atherosclerosis of aorta: Secondary | ICD-10-CM | POA: Diagnosis not present

## 2021-12-24 DIAGNOSIS — J9601 Acute respiratory failure with hypoxia: Secondary | ICD-10-CM | POA: Diagnosis not present

## 2021-12-24 DIAGNOSIS — M810 Age-related osteoporosis without current pathological fracture: Secondary | ICD-10-CM | POA: Diagnosis not present

## 2021-12-24 DIAGNOSIS — M4802 Spinal stenosis, cervical region: Secondary | ICD-10-CM | POA: Diagnosis not present

## 2021-12-24 DIAGNOSIS — N319 Neuromuscular dysfunction of bladder, unspecified: Secondary | ICD-10-CM | POA: Diagnosis not present

## 2021-12-24 DIAGNOSIS — Z466 Encounter for fitting and adjustment of urinary device: Secondary | ICD-10-CM | POA: Diagnosis not present

## 2021-12-24 DIAGNOSIS — G8222 Paraplegia, incomplete: Secondary | ICD-10-CM | POA: Diagnosis not present

## 2021-12-24 DIAGNOSIS — M170 Bilateral primary osteoarthritis of knee: Secondary | ICD-10-CM | POA: Diagnosis not present

## 2021-12-24 DIAGNOSIS — M47812 Spondylosis without myelopathy or radiculopathy, cervical region: Secondary | ICD-10-CM | POA: Diagnosis not present

## 2021-12-25 ENCOUNTER — Telehealth: Payer: PPO | Admitting: Family Medicine

## 2021-12-25 DIAGNOSIS — R339 Retention of urine, unspecified: Secondary | ICD-10-CM | POA: Diagnosis not present

## 2021-12-27 DIAGNOSIS — Z515 Encounter for palliative care: Secondary | ICD-10-CM | POA: Diagnosis not present

## 2021-12-27 DIAGNOSIS — J449 Chronic obstructive pulmonary disease, unspecified: Secondary | ICD-10-CM | POA: Diagnosis not present

## 2021-12-27 DIAGNOSIS — Z9359 Other cystostomy status: Secondary | ICD-10-CM | POA: Diagnosis not present

## 2021-12-30 DIAGNOSIS — N319 Neuromuscular dysfunction of bladder, unspecified: Secondary | ICD-10-CM | POA: Diagnosis not present

## 2021-12-30 DIAGNOSIS — Z466 Encounter for fitting and adjustment of urinary device: Secondary | ICD-10-CM | POA: Diagnosis not present

## 2021-12-30 DIAGNOSIS — M47814 Spondylosis without myelopathy or radiculopathy, thoracic region: Secondary | ICD-10-CM | POA: Diagnosis not present

## 2021-12-30 DIAGNOSIS — F331 Major depressive disorder, recurrent, moderate: Secondary | ICD-10-CM | POA: Diagnosis not present

## 2021-12-30 DIAGNOSIS — K219 Gastro-esophageal reflux disease without esophagitis: Secondary | ICD-10-CM | POA: Diagnosis not present

## 2021-12-30 DIAGNOSIS — R7303 Prediabetes: Secondary | ICD-10-CM | POA: Diagnosis not present

## 2021-12-30 DIAGNOSIS — M47812 Spondylosis without myelopathy or radiculopathy, cervical region: Secondary | ICD-10-CM | POA: Diagnosis not present

## 2021-12-30 DIAGNOSIS — Z7901 Long term (current) use of anticoagulants: Secondary | ICD-10-CM | POA: Diagnosis not present

## 2021-12-30 DIAGNOSIS — E46 Unspecified protein-calorie malnutrition: Secondary | ICD-10-CM | POA: Diagnosis not present

## 2021-12-30 DIAGNOSIS — I700 Atherosclerosis of aorta: Secondary | ICD-10-CM | POA: Diagnosis not present

## 2021-12-30 DIAGNOSIS — G894 Chronic pain syndrome: Secondary | ICD-10-CM | POA: Diagnosis not present

## 2021-12-30 DIAGNOSIS — G8222 Paraplegia, incomplete: Secondary | ICD-10-CM | POA: Diagnosis not present

## 2021-12-30 DIAGNOSIS — Z7952 Long term (current) use of systemic steroids: Secondary | ICD-10-CM | POA: Diagnosis not present

## 2021-12-30 DIAGNOSIS — N189 Chronic kidney disease, unspecified: Secondary | ICD-10-CM | POA: Diagnosis not present

## 2021-12-30 DIAGNOSIS — F5101 Primary insomnia: Secondary | ICD-10-CM | POA: Diagnosis not present

## 2021-12-30 DIAGNOSIS — M4802 Spinal stenosis, cervical region: Secondary | ICD-10-CM | POA: Diagnosis not present

## 2021-12-30 DIAGNOSIS — J9601 Acute respiratory failure with hypoxia: Secondary | ICD-10-CM | POA: Diagnosis not present

## 2021-12-30 DIAGNOSIS — G2581 Restless legs syndrome: Secondary | ICD-10-CM | POA: Diagnosis not present

## 2021-12-30 DIAGNOSIS — Z9350 Unspecified cystostomy status: Secondary | ICD-10-CM | POA: Diagnosis not present

## 2021-12-30 DIAGNOSIS — M810 Age-related osteoporosis without current pathological fracture: Secondary | ICD-10-CM | POA: Diagnosis not present

## 2021-12-30 DIAGNOSIS — I82441 Acute embolism and thrombosis of right tibial vein: Secondary | ICD-10-CM | POA: Diagnosis not present

## 2021-12-30 DIAGNOSIS — F411 Generalized anxiety disorder: Secondary | ICD-10-CM | POA: Diagnosis not present

## 2021-12-30 DIAGNOSIS — G40909 Epilepsy, unspecified, not intractable, without status epilepticus: Secondary | ICD-10-CM | POA: Diagnosis not present

## 2021-12-30 DIAGNOSIS — M170 Bilateral primary osteoarthritis of knee: Secondary | ICD-10-CM | POA: Diagnosis not present

## 2022-01-02 DIAGNOSIS — M4802 Spinal stenosis, cervical region: Secondary | ICD-10-CM | POA: Diagnosis not present

## 2022-01-02 DIAGNOSIS — F5101 Primary insomnia: Secondary | ICD-10-CM | POA: Diagnosis not present

## 2022-01-02 DIAGNOSIS — F331 Major depressive disorder, recurrent, moderate: Secondary | ICD-10-CM | POA: Diagnosis not present

## 2022-01-02 DIAGNOSIS — G40909 Epilepsy, unspecified, not intractable, without status epilepticus: Secondary | ICD-10-CM | POA: Diagnosis not present

## 2022-01-02 DIAGNOSIS — Z466 Encounter for fitting and adjustment of urinary device: Secondary | ICD-10-CM | POA: Diagnosis not present

## 2022-01-02 DIAGNOSIS — G894 Chronic pain syndrome: Secondary | ICD-10-CM | POA: Diagnosis not present

## 2022-01-02 DIAGNOSIS — R7303 Prediabetes: Secondary | ICD-10-CM | POA: Diagnosis not present

## 2022-01-02 DIAGNOSIS — M47814 Spondylosis without myelopathy or radiculopathy, thoracic region: Secondary | ICD-10-CM | POA: Diagnosis not present

## 2022-01-02 DIAGNOSIS — Z9350 Unspecified cystostomy status: Secondary | ICD-10-CM | POA: Diagnosis not present

## 2022-01-02 DIAGNOSIS — I700 Atherosclerosis of aorta: Secondary | ICD-10-CM | POA: Diagnosis not present

## 2022-01-02 DIAGNOSIS — M810 Age-related osteoporosis without current pathological fracture: Secondary | ICD-10-CM | POA: Diagnosis not present

## 2022-01-02 DIAGNOSIS — F411 Generalized anxiety disorder: Secondary | ICD-10-CM | POA: Diagnosis not present

## 2022-01-02 DIAGNOSIS — K219 Gastro-esophageal reflux disease without esophagitis: Secondary | ICD-10-CM | POA: Diagnosis not present

## 2022-01-02 DIAGNOSIS — M170 Bilateral primary osteoarthritis of knee: Secondary | ICD-10-CM | POA: Diagnosis not present

## 2022-01-02 DIAGNOSIS — N189 Chronic kidney disease, unspecified: Secondary | ICD-10-CM | POA: Diagnosis not present

## 2022-01-02 DIAGNOSIS — I82441 Acute embolism and thrombosis of right tibial vein: Secondary | ICD-10-CM | POA: Diagnosis not present

## 2022-01-02 DIAGNOSIS — J9601 Acute respiratory failure with hypoxia: Secondary | ICD-10-CM | POA: Diagnosis not present

## 2022-01-02 DIAGNOSIS — M47812 Spondylosis without myelopathy or radiculopathy, cervical region: Secondary | ICD-10-CM | POA: Diagnosis not present

## 2022-01-02 DIAGNOSIS — Z7952 Long term (current) use of systemic steroids: Secondary | ICD-10-CM | POA: Diagnosis not present

## 2022-01-02 DIAGNOSIS — G8222 Paraplegia, incomplete: Secondary | ICD-10-CM | POA: Diagnosis not present

## 2022-01-02 DIAGNOSIS — N319 Neuromuscular dysfunction of bladder, unspecified: Secondary | ICD-10-CM | POA: Diagnosis not present

## 2022-01-02 DIAGNOSIS — E46 Unspecified protein-calorie malnutrition: Secondary | ICD-10-CM | POA: Diagnosis not present

## 2022-01-02 DIAGNOSIS — Z7901 Long term (current) use of anticoagulants: Secondary | ICD-10-CM | POA: Diagnosis not present

## 2022-01-02 DIAGNOSIS — G2581 Restless legs syndrome: Secondary | ICD-10-CM | POA: Diagnosis not present

## 2022-01-03 DIAGNOSIS — M4802 Spinal stenosis, cervical region: Secondary | ICD-10-CM | POA: Diagnosis not present

## 2022-01-03 DIAGNOSIS — M810 Age-related osteoporosis without current pathological fracture: Secondary | ICD-10-CM | POA: Diagnosis not present

## 2022-01-03 DIAGNOSIS — F5101 Primary insomnia: Secondary | ICD-10-CM | POA: Diagnosis not present

## 2022-01-03 DIAGNOSIS — M47814 Spondylosis without myelopathy or radiculopathy, thoracic region: Secondary | ICD-10-CM | POA: Diagnosis not present

## 2022-01-03 DIAGNOSIS — N319 Neuromuscular dysfunction of bladder, unspecified: Secondary | ICD-10-CM | POA: Diagnosis not present

## 2022-01-03 DIAGNOSIS — F411 Generalized anxiety disorder: Secondary | ICD-10-CM | POA: Diagnosis not present

## 2022-01-03 DIAGNOSIS — R7303 Prediabetes: Secondary | ICD-10-CM | POA: Diagnosis not present

## 2022-01-03 DIAGNOSIS — I82441 Acute embolism and thrombosis of right tibial vein: Secondary | ICD-10-CM | POA: Diagnosis not present

## 2022-01-03 DIAGNOSIS — F331 Major depressive disorder, recurrent, moderate: Secondary | ICD-10-CM | POA: Diagnosis not present

## 2022-01-03 DIAGNOSIS — J9601 Acute respiratory failure with hypoxia: Secondary | ICD-10-CM | POA: Diagnosis not present

## 2022-01-03 DIAGNOSIS — N189 Chronic kidney disease, unspecified: Secondary | ICD-10-CM | POA: Diagnosis not present

## 2022-01-03 DIAGNOSIS — G40909 Epilepsy, unspecified, not intractable, without status epilepticus: Secondary | ICD-10-CM | POA: Diagnosis not present

## 2022-01-03 DIAGNOSIS — E46 Unspecified protein-calorie malnutrition: Secondary | ICD-10-CM | POA: Diagnosis not present

## 2022-01-03 DIAGNOSIS — G894 Chronic pain syndrome: Secondary | ICD-10-CM | POA: Diagnosis not present

## 2022-01-03 DIAGNOSIS — G8222 Paraplegia, incomplete: Secondary | ICD-10-CM | POA: Diagnosis not present

## 2022-01-03 DIAGNOSIS — Z9350 Unspecified cystostomy status: Secondary | ICD-10-CM | POA: Diagnosis not present

## 2022-01-03 DIAGNOSIS — M47812 Spondylosis without myelopathy or radiculopathy, cervical region: Secondary | ICD-10-CM | POA: Diagnosis not present

## 2022-01-03 DIAGNOSIS — Z7952 Long term (current) use of systemic steroids: Secondary | ICD-10-CM | POA: Diagnosis not present

## 2022-01-03 DIAGNOSIS — Z466 Encounter for fitting and adjustment of urinary device: Secondary | ICD-10-CM | POA: Diagnosis not present

## 2022-01-03 DIAGNOSIS — I700 Atherosclerosis of aorta: Secondary | ICD-10-CM | POA: Diagnosis not present

## 2022-01-03 DIAGNOSIS — M170 Bilateral primary osteoarthritis of knee: Secondary | ICD-10-CM | POA: Diagnosis not present

## 2022-01-03 DIAGNOSIS — G2581 Restless legs syndrome: Secondary | ICD-10-CM | POA: Diagnosis not present

## 2022-01-03 DIAGNOSIS — K219 Gastro-esophageal reflux disease without esophagitis: Secondary | ICD-10-CM | POA: Diagnosis not present

## 2022-01-03 DIAGNOSIS — Z7901 Long term (current) use of anticoagulants: Secondary | ICD-10-CM | POA: Diagnosis not present

## 2022-01-04 DIAGNOSIS — R0902 Hypoxemia: Secondary | ICD-10-CM | POA: Diagnosis not present

## 2022-01-04 DIAGNOSIS — R0989 Other specified symptoms and signs involving the circulatory and respiratory systems: Secondary | ICD-10-CM | POA: Diagnosis not present

## 2022-01-05 DIAGNOSIS — Z7401 Bed confinement status: Secondary | ICD-10-CM | POA: Diagnosis not present

## 2022-01-05 DIAGNOSIS — R059 Cough, unspecified: Secondary | ICD-10-CM | POA: Diagnosis not present

## 2022-01-10 DIAGNOSIS — M47812 Spondylosis without myelopathy or radiculopathy, cervical region: Secondary | ICD-10-CM | POA: Diagnosis not present

## 2022-01-10 DIAGNOSIS — N319 Neuromuscular dysfunction of bladder, unspecified: Secondary | ICD-10-CM | POA: Diagnosis not present

## 2022-01-10 DIAGNOSIS — F331 Major depressive disorder, recurrent, moderate: Secondary | ICD-10-CM | POA: Diagnosis not present

## 2022-01-10 DIAGNOSIS — M170 Bilateral primary osteoarthritis of knee: Secondary | ICD-10-CM | POA: Diagnosis not present

## 2022-01-10 DIAGNOSIS — E46 Unspecified protein-calorie malnutrition: Secondary | ICD-10-CM | POA: Diagnosis not present

## 2022-01-10 DIAGNOSIS — G40909 Epilepsy, unspecified, not intractable, without status epilepticus: Secondary | ICD-10-CM | POA: Diagnosis not present

## 2022-01-10 DIAGNOSIS — M47814 Spondylosis without myelopathy or radiculopathy, thoracic region: Secondary | ICD-10-CM | POA: Diagnosis not present

## 2022-01-10 DIAGNOSIS — Z466 Encounter for fitting and adjustment of urinary device: Secondary | ICD-10-CM | POA: Diagnosis not present

## 2022-01-10 DIAGNOSIS — R7303 Prediabetes: Secondary | ICD-10-CM | POA: Diagnosis not present

## 2022-01-10 DIAGNOSIS — I82441 Acute embolism and thrombosis of right tibial vein: Secondary | ICD-10-CM | POA: Diagnosis not present

## 2022-01-10 DIAGNOSIS — M810 Age-related osteoporosis without current pathological fracture: Secondary | ICD-10-CM | POA: Diagnosis not present

## 2022-01-10 DIAGNOSIS — G2581 Restless legs syndrome: Secondary | ICD-10-CM | POA: Diagnosis not present

## 2022-01-10 DIAGNOSIS — Z9350 Unspecified cystostomy status: Secondary | ICD-10-CM | POA: Diagnosis not present

## 2022-01-10 DIAGNOSIS — J9601 Acute respiratory failure with hypoxia: Secondary | ICD-10-CM | POA: Diagnosis not present

## 2022-01-10 DIAGNOSIS — Z7901 Long term (current) use of anticoagulants: Secondary | ICD-10-CM | POA: Diagnosis not present

## 2022-01-10 DIAGNOSIS — K219 Gastro-esophageal reflux disease without esophagitis: Secondary | ICD-10-CM | POA: Diagnosis not present

## 2022-01-10 DIAGNOSIS — G8222 Paraplegia, incomplete: Secondary | ICD-10-CM | POA: Diagnosis not present

## 2022-01-10 DIAGNOSIS — F411 Generalized anxiety disorder: Secondary | ICD-10-CM | POA: Diagnosis not present

## 2022-01-10 DIAGNOSIS — F5101 Primary insomnia: Secondary | ICD-10-CM | POA: Diagnosis not present

## 2022-01-10 DIAGNOSIS — G894 Chronic pain syndrome: Secondary | ICD-10-CM | POA: Diagnosis not present

## 2022-01-10 DIAGNOSIS — N189 Chronic kidney disease, unspecified: Secondary | ICD-10-CM | POA: Diagnosis not present

## 2022-01-10 DIAGNOSIS — Z7952 Long term (current) use of systemic steroids: Secondary | ICD-10-CM | POA: Diagnosis not present

## 2022-01-10 DIAGNOSIS — M4802 Spinal stenosis, cervical region: Secondary | ICD-10-CM | POA: Diagnosis not present

## 2022-01-10 DIAGNOSIS — I700 Atherosclerosis of aorta: Secondary | ICD-10-CM | POA: Diagnosis not present

## 2022-01-22 DIAGNOSIS — J9601 Acute respiratory failure with hypoxia: Secondary | ICD-10-CM | POA: Diagnosis not present

## 2022-01-22 DIAGNOSIS — G8222 Paraplegia, incomplete: Secondary | ICD-10-CM | POA: Diagnosis not present

## 2022-01-22 DIAGNOSIS — J168 Pneumonia due to other specified infectious organisms: Secondary | ICD-10-CM | POA: Diagnosis not present

## 2022-01-23 ENCOUNTER — Other Ambulatory Visit: Payer: Self-pay | Admitting: Neurology

## 2022-01-23 DIAGNOSIS — G9340 Encephalopathy, unspecified: Secondary | ICD-10-CM

## 2022-01-23 DIAGNOSIS — G0490 Encephalitis and encephalomyelitis, unspecified: Secondary | ICD-10-CM

## 2022-01-23 MED ORDER — PREGABALIN 50 MG PO CAPS: 50.0000 mg | ORAL_CAPSULE | Freq: Two times a day (BID) | ORAL | 1 refills | Status: DC

## 2022-01-23 NOTE — Telephone Encounter (Signed)
I have routed this request to Dr Sater for review. The pt is due for the medication and [place] registry was verified.  

## 2022-01-23 NOTE — Telephone Encounter (Signed)
Pt is requesting a refill for pregabalin (LYRICA) 50 MG capsule.  Pharmacy: [id number]

## 2022-01-24 DIAGNOSIS — Z9350 Unspecified cystostomy status: Secondary | ICD-10-CM | POA: Diagnosis not present

## 2022-01-24 DIAGNOSIS — F411 Generalized anxiety disorder: Secondary | ICD-10-CM | POA: Diagnosis not present

## 2022-01-24 DIAGNOSIS — G8222 Paraplegia, incomplete: Secondary | ICD-10-CM | POA: Diagnosis not present

## 2022-01-24 DIAGNOSIS — N189 Chronic kidney disease, unspecified: Secondary | ICD-10-CM | POA: Diagnosis not present

## 2022-01-24 DIAGNOSIS — G894 Chronic pain syndrome: Secondary | ICD-10-CM | POA: Diagnosis not present

## 2022-01-24 DIAGNOSIS — M47814 Spondylosis without myelopathy or radiculopathy, thoracic region: Secondary | ICD-10-CM | POA: Diagnosis not present

## 2022-01-24 DIAGNOSIS — K219 Gastro-esophageal reflux disease without esophagitis: Secondary | ICD-10-CM | POA: Diagnosis not present

## 2022-01-24 DIAGNOSIS — Z466 Encounter for fitting and adjustment of urinary device: Secondary | ICD-10-CM | POA: Diagnosis not present

## 2022-01-24 DIAGNOSIS — G2581 Restless legs syndrome: Secondary | ICD-10-CM | POA: Diagnosis not present

## 2022-01-24 DIAGNOSIS — I700 Atherosclerosis of aorta: Secondary | ICD-10-CM | POA: Diagnosis not present

## 2022-01-24 DIAGNOSIS — M4802 Spinal stenosis, cervical region: Secondary | ICD-10-CM | POA: Diagnosis not present

## 2022-01-24 DIAGNOSIS — E46 Unspecified protein-calorie malnutrition: Secondary | ICD-10-CM | POA: Diagnosis not present

## 2022-01-24 DIAGNOSIS — Z7952 Long term (current) use of systemic steroids: Secondary | ICD-10-CM | POA: Diagnosis not present

## 2022-01-24 DIAGNOSIS — N319 Neuromuscular dysfunction of bladder, unspecified: Secondary | ICD-10-CM | POA: Diagnosis not present

## 2022-01-24 DIAGNOSIS — R7303 Prediabetes: Secondary | ICD-10-CM | POA: Diagnosis not present

## 2022-01-24 DIAGNOSIS — M170 Bilateral primary osteoarthritis of knee: Secondary | ICD-10-CM | POA: Diagnosis not present

## 2022-01-24 DIAGNOSIS — F331 Major depressive disorder, recurrent, moderate: Secondary | ICD-10-CM | POA: Diagnosis not present

## 2022-01-24 DIAGNOSIS — G40909 Epilepsy, unspecified, not intractable, without status epilepticus: Secondary | ICD-10-CM | POA: Diagnosis not present

## 2022-01-24 DIAGNOSIS — M810 Age-related osteoporosis without current pathological fracture: Secondary | ICD-10-CM | POA: Diagnosis not present

## 2022-01-24 DIAGNOSIS — F5101 Primary insomnia: Secondary | ICD-10-CM | POA: Diagnosis not present

## 2022-01-24 DIAGNOSIS — M47812 Spondylosis without myelopathy or radiculopathy, cervical region: Secondary | ICD-10-CM | POA: Diagnosis not present

## 2022-01-24 DIAGNOSIS — J9601 Acute respiratory failure with hypoxia: Secondary | ICD-10-CM | POA: Diagnosis not present

## 2022-01-24 DIAGNOSIS — Z7901 Long term (current) use of anticoagulants: Secondary | ICD-10-CM | POA: Diagnosis not present

## 2022-01-24 DIAGNOSIS — I82441 Acute embolism and thrombosis of right tibial vein: Secondary | ICD-10-CM | POA: Diagnosis not present

## 2022-02-01 DIAGNOSIS — M4802 Spinal stenosis, cervical region: Secondary | ICD-10-CM | POA: Diagnosis not present

## 2022-02-01 DIAGNOSIS — I82441 Acute embolism and thrombosis of right tibial vein: Secondary | ICD-10-CM | POA: Diagnosis not present

## 2022-02-01 DIAGNOSIS — Z7952 Long term (current) use of systemic steroids: Secondary | ICD-10-CM | POA: Diagnosis not present

## 2022-02-01 DIAGNOSIS — R7303 Prediabetes: Secondary | ICD-10-CM | POA: Diagnosis not present

## 2022-02-01 DIAGNOSIS — G40909 Epilepsy, unspecified, not intractable, without status epilepticus: Secondary | ICD-10-CM | POA: Diagnosis not present

## 2022-02-01 DIAGNOSIS — M47814 Spondylosis without myelopathy or radiculopathy, thoracic region: Secondary | ICD-10-CM | POA: Diagnosis not present

## 2022-02-01 DIAGNOSIS — K219 Gastro-esophageal reflux disease without esophagitis: Secondary | ICD-10-CM | POA: Diagnosis not present

## 2022-02-01 DIAGNOSIS — Z466 Encounter for fitting and adjustment of urinary device: Secondary | ICD-10-CM | POA: Diagnosis not present

## 2022-02-01 DIAGNOSIS — J9601 Acute respiratory failure with hypoxia: Secondary | ICD-10-CM | POA: Diagnosis not present

## 2022-02-01 DIAGNOSIS — F411 Generalized anxiety disorder: Secondary | ICD-10-CM | POA: Diagnosis not present

## 2022-02-01 DIAGNOSIS — M47812 Spondylosis without myelopathy or radiculopathy, cervical region: Secondary | ICD-10-CM | POA: Diagnosis not present

## 2022-02-01 DIAGNOSIS — G2581 Restless legs syndrome: Secondary | ICD-10-CM | POA: Diagnosis not present

## 2022-02-01 DIAGNOSIS — Z7901 Long term (current) use of anticoagulants: Secondary | ICD-10-CM | POA: Diagnosis not present

## 2022-02-01 DIAGNOSIS — M170 Bilateral primary osteoarthritis of knee: Secondary | ICD-10-CM | POA: Diagnosis not present

## 2022-02-01 DIAGNOSIS — F331 Major depressive disorder, recurrent, moderate: Secondary | ICD-10-CM | POA: Diagnosis not present

## 2022-02-01 DIAGNOSIS — N319 Neuromuscular dysfunction of bladder, unspecified: Secondary | ICD-10-CM | POA: Diagnosis not present

## 2022-02-01 DIAGNOSIS — G894 Chronic pain syndrome: Secondary | ICD-10-CM | POA: Diagnosis not present

## 2022-02-01 DIAGNOSIS — F5101 Primary insomnia: Secondary | ICD-10-CM | POA: Diagnosis not present

## 2022-02-01 DIAGNOSIS — I700 Atherosclerosis of aorta: Secondary | ICD-10-CM | POA: Diagnosis not present

## 2022-02-01 DIAGNOSIS — Z9350 Unspecified cystostomy status: Secondary | ICD-10-CM | POA: Diagnosis not present

## 2022-02-01 DIAGNOSIS — G8222 Paraplegia, incomplete: Secondary | ICD-10-CM | POA: Diagnosis not present

## 2022-02-01 DIAGNOSIS — E46 Unspecified protein-calorie malnutrition: Secondary | ICD-10-CM | POA: Diagnosis not present

## 2022-02-01 DIAGNOSIS — N189 Chronic kidney disease, unspecified: Secondary | ICD-10-CM | POA: Diagnosis not present

## 2022-02-01 DIAGNOSIS — M810 Age-related osteoporosis without current pathological fracture: Secondary | ICD-10-CM | POA: Diagnosis not present

## 2022-02-06 DIAGNOSIS — M5136 Other intervertebral disc degeneration, lumbar region: Secondary | ICD-10-CM | POA: Diagnosis not present

## 2022-02-06 DIAGNOSIS — R339 Retention of urine, unspecified: Secondary | ICD-10-CM | POA: Diagnosis not present

## 2022-02-09 DIAGNOSIS — N189 Chronic kidney disease, unspecified: Secondary | ICD-10-CM | POA: Diagnosis not present

## 2022-02-09 DIAGNOSIS — M170 Bilateral primary osteoarthritis of knee: Secondary | ICD-10-CM | POA: Diagnosis not present

## 2022-02-09 DIAGNOSIS — G2581 Restless legs syndrome: Secondary | ICD-10-CM | POA: Diagnosis not present

## 2022-02-09 DIAGNOSIS — G894 Chronic pain syndrome: Secondary | ICD-10-CM | POA: Diagnosis not present

## 2022-02-09 DIAGNOSIS — G8222 Paraplegia, incomplete: Secondary | ICD-10-CM | POA: Diagnosis not present

## 2022-02-09 DIAGNOSIS — M4802 Spinal stenosis, cervical region: Secondary | ICD-10-CM | POA: Diagnosis not present

## 2022-02-09 DIAGNOSIS — J9601 Acute respiratory failure with hypoxia: Secondary | ICD-10-CM | POA: Diagnosis not present

## 2022-02-09 DIAGNOSIS — F5101 Primary insomnia: Secondary | ICD-10-CM | POA: Diagnosis not present

## 2022-02-09 DIAGNOSIS — M47814 Spondylosis without myelopathy or radiculopathy, thoracic region: Secondary | ICD-10-CM | POA: Diagnosis not present

## 2022-02-09 DIAGNOSIS — M47812 Spondylosis without myelopathy or radiculopathy, cervical region: Secondary | ICD-10-CM | POA: Diagnosis not present

## 2022-02-09 DIAGNOSIS — Z7901 Long term (current) use of anticoagulants: Secondary | ICD-10-CM | POA: Diagnosis not present

## 2022-02-09 DIAGNOSIS — I82441 Acute embolism and thrombosis of right tibial vein: Secondary | ICD-10-CM | POA: Diagnosis not present

## 2022-02-09 DIAGNOSIS — K219 Gastro-esophageal reflux disease without esophagitis: Secondary | ICD-10-CM | POA: Diagnosis not present

## 2022-02-09 DIAGNOSIS — M810 Age-related osteoporosis without current pathological fracture: Secondary | ICD-10-CM | POA: Diagnosis not present

## 2022-02-09 DIAGNOSIS — E46 Unspecified protein-calorie malnutrition: Secondary | ICD-10-CM | POA: Diagnosis not present

## 2022-02-09 DIAGNOSIS — Z7952 Long term (current) use of systemic steroids: Secondary | ICD-10-CM | POA: Diagnosis not present

## 2022-02-09 DIAGNOSIS — Z9350 Unspecified cystostomy status: Secondary | ICD-10-CM | POA: Diagnosis not present

## 2022-02-09 DIAGNOSIS — F411 Generalized anxiety disorder: Secondary | ICD-10-CM | POA: Diagnosis not present

## 2022-02-09 DIAGNOSIS — G40909 Epilepsy, unspecified, not intractable, without status epilepticus: Secondary | ICD-10-CM | POA: Diagnosis not present

## 2022-02-09 DIAGNOSIS — F331 Major depressive disorder, recurrent, moderate: Secondary | ICD-10-CM | POA: Diagnosis not present

## 2022-02-09 DIAGNOSIS — I700 Atherosclerosis of aorta: Secondary | ICD-10-CM | POA: Diagnosis not present

## 2022-02-09 DIAGNOSIS — R7303 Prediabetes: Secondary | ICD-10-CM | POA: Diagnosis not present

## 2022-02-09 DIAGNOSIS — Z466 Encounter for fitting and adjustment of urinary device: Secondary | ICD-10-CM | POA: Diagnosis not present

## 2022-02-09 DIAGNOSIS — N319 Neuromuscular dysfunction of bladder, unspecified: Secondary | ICD-10-CM | POA: Diagnosis not present

## 2022-02-20 DIAGNOSIS — R339 Retention of urine, unspecified: Secondary | ICD-10-CM | POA: Diagnosis not present

## 2022-02-22 DIAGNOSIS — N319 Neuromuscular dysfunction of bladder, unspecified: Secondary | ICD-10-CM | POA: Diagnosis not present

## 2022-02-22 DIAGNOSIS — M4802 Spinal stenosis, cervical region: Secondary | ICD-10-CM | POA: Diagnosis not present

## 2022-02-22 DIAGNOSIS — M170 Bilateral primary osteoarthritis of knee: Secondary | ICD-10-CM | POA: Diagnosis not present

## 2022-02-22 DIAGNOSIS — M47812 Spondylosis without myelopathy or radiculopathy, cervical region: Secondary | ICD-10-CM | POA: Diagnosis not present

## 2022-02-22 DIAGNOSIS — J168 Pneumonia due to other specified infectious organisms: Secondary | ICD-10-CM | POA: Diagnosis not present

## 2022-02-22 DIAGNOSIS — G8222 Paraplegia, incomplete: Secondary | ICD-10-CM | POA: Diagnosis not present

## 2022-02-22 DIAGNOSIS — M47814 Spondylosis without myelopathy or radiculopathy, thoracic region: Secondary | ICD-10-CM | POA: Diagnosis not present

## 2022-02-22 DIAGNOSIS — Z466 Encounter for fitting and adjustment of urinary device: Secondary | ICD-10-CM | POA: Diagnosis not present

## 2022-02-22 DIAGNOSIS — M810 Age-related osteoporosis without current pathological fracture: Secondary | ICD-10-CM | POA: Diagnosis not present

## 2022-02-22 DIAGNOSIS — N189 Chronic kidney disease, unspecified: Secondary | ICD-10-CM | POA: Diagnosis not present

## 2022-02-22 DIAGNOSIS — I700 Atherosclerosis of aorta: Secondary | ICD-10-CM | POA: Diagnosis not present

## 2022-02-22 DIAGNOSIS — I82441 Acute embolism and thrombosis of right tibial vein: Secondary | ICD-10-CM | POA: Diagnosis not present

## 2022-02-22 DIAGNOSIS — J9601 Acute respiratory failure with hypoxia: Secondary | ICD-10-CM | POA: Diagnosis not present

## 2022-03-05 DIAGNOSIS — I8390 Asymptomatic varicose veins of unspecified lower extremity: Secondary | ICD-10-CM | POA: Diagnosis not present

## 2022-03-05 DIAGNOSIS — J449 Chronic obstructive pulmonary disease, unspecified: Secondary | ICD-10-CM | POA: Diagnosis not present

## 2022-03-05 DIAGNOSIS — Z515 Encounter for palliative care: Secondary | ICD-10-CM | POA: Diagnosis not present

## 2022-03-05 DIAGNOSIS — F0390 Unspecified dementia without behavioral disturbance: Secondary | ICD-10-CM | POA: Diagnosis not present

## 2022-03-05 DIAGNOSIS — Z9359 Other cystostomy status: Secondary | ICD-10-CM | POA: Diagnosis not present

## 2022-03-07 DIAGNOSIS — R140 Abdominal distension (gaseous): Secondary | ICD-10-CM | POA: Diagnosis not present

## 2022-03-07 DIAGNOSIS — R197 Diarrhea, unspecified: Secondary | ICD-10-CM | POA: Diagnosis not present

## 2022-03-08 DIAGNOSIS — G894 Chronic pain syndrome: Secondary | ICD-10-CM | POA: Diagnosis not present

## 2022-03-08 DIAGNOSIS — G8222 Paraplegia, incomplete: Secondary | ICD-10-CM | POA: Diagnosis not present

## 2022-03-08 DIAGNOSIS — K219 Gastro-esophageal reflux disease without esophagitis: Secondary | ICD-10-CM | POA: Diagnosis not present

## 2022-03-08 DIAGNOSIS — M4802 Spinal stenosis, cervical region: Secondary | ICD-10-CM | POA: Diagnosis not present

## 2022-03-08 DIAGNOSIS — M170 Bilateral primary osteoarthritis of knee: Secondary | ICD-10-CM | POA: Diagnosis not present

## 2022-03-08 DIAGNOSIS — I82441 Acute embolism and thrombosis of right tibial vein: Secondary | ICD-10-CM | POA: Diagnosis not present

## 2022-03-08 DIAGNOSIS — G2581 Restless legs syndrome: Secondary | ICD-10-CM | POA: Diagnosis not present

## 2022-03-08 DIAGNOSIS — G40909 Epilepsy, unspecified, not intractable, without status epilepticus: Secondary | ICD-10-CM | POA: Diagnosis not present

## 2022-03-08 DIAGNOSIS — I700 Atherosclerosis of aorta: Secondary | ICD-10-CM | POA: Diagnosis not present

## 2022-03-08 DIAGNOSIS — F5101 Primary insomnia: Secondary | ICD-10-CM | POA: Diagnosis not present

## 2022-03-08 DIAGNOSIS — R7303 Prediabetes: Secondary | ICD-10-CM | POA: Diagnosis not present

## 2022-03-08 DIAGNOSIS — F331 Major depressive disorder, recurrent, moderate: Secondary | ICD-10-CM | POA: Diagnosis not present

## 2022-03-08 DIAGNOSIS — Z7952 Long term (current) use of systemic steroids: Secondary | ICD-10-CM | POA: Diagnosis not present

## 2022-03-08 DIAGNOSIS — E46 Unspecified protein-calorie malnutrition: Secondary | ICD-10-CM | POA: Diagnosis not present

## 2022-03-08 DIAGNOSIS — J9601 Acute respiratory failure with hypoxia: Secondary | ICD-10-CM | POA: Diagnosis not present

## 2022-03-08 DIAGNOSIS — M47814 Spondylosis without myelopathy or radiculopathy, thoracic region: Secondary | ICD-10-CM | POA: Diagnosis not present

## 2022-03-08 DIAGNOSIS — Z466 Encounter for fitting and adjustment of urinary device: Secondary | ICD-10-CM | POA: Diagnosis not present

## 2022-03-08 DIAGNOSIS — N319 Neuromuscular dysfunction of bladder, unspecified: Secondary | ICD-10-CM | POA: Diagnosis not present

## 2022-03-08 DIAGNOSIS — M47812 Spondylosis without myelopathy or radiculopathy, cervical region: Secondary | ICD-10-CM | POA: Diagnosis not present

## 2022-03-08 DIAGNOSIS — N189 Chronic kidney disease, unspecified: Secondary | ICD-10-CM | POA: Diagnosis not present

## 2022-03-08 DIAGNOSIS — F411 Generalized anxiety disorder: Secondary | ICD-10-CM | POA: Diagnosis not present

## 2022-03-08 DIAGNOSIS — Z7901 Long term (current) use of anticoagulants: Secondary | ICD-10-CM | POA: Diagnosis not present

## 2022-03-08 DIAGNOSIS — Z9350 Unspecified cystostomy status: Secondary | ICD-10-CM | POA: Diagnosis not present

## 2022-03-08 DIAGNOSIS — M810 Age-related osteoporosis without current pathological fracture: Secondary | ICD-10-CM | POA: Diagnosis not present

## 2022-03-09 DIAGNOSIS — R140 Abdominal distension (gaseous): Secondary | ICD-10-CM | POA: Diagnosis not present

## 2022-03-09 DIAGNOSIS — R197 Diarrhea, unspecified: Secondary | ICD-10-CM | POA: Diagnosis not present

## 2022-03-24 DIAGNOSIS — J168 Pneumonia due to other specified infectious organisms: Secondary | ICD-10-CM | POA: Diagnosis not present

## 2022-03-24 DIAGNOSIS — G8222 Paraplegia, incomplete: Secondary | ICD-10-CM | POA: Diagnosis not present

## 2022-03-24 DIAGNOSIS — J9601 Acute respiratory failure with hypoxia: Secondary | ICD-10-CM | POA: Diagnosis not present

## 2022-03-27 ENCOUNTER — Other Ambulatory Visit: Payer: Self-pay | Admitting: Neurology

## 2022-03-27 DIAGNOSIS — R339 Retention of urine, unspecified: Secondary | ICD-10-CM | POA: Diagnosis not present

## 2022-04-12 ENCOUNTER — Other Ambulatory Visit: Payer: Self-pay

## 2022-04-12 DIAGNOSIS — M792 Neuralgia and neuritis, unspecified: Secondary | ICD-10-CM

## 2022-04-12 DIAGNOSIS — R413 Other amnesia: Secondary | ICD-10-CM

## 2022-04-12 DIAGNOSIS — R269 Unspecified abnormalities of gait and mobility: Secondary | ICD-10-CM

## 2022-04-12 DIAGNOSIS — G9340 Encephalopathy, unspecified: Secondary | ICD-10-CM

## 2022-04-12 DIAGNOSIS — G0490 Encephalitis and encephalomyelitis, unspecified: Secondary | ICD-10-CM

## 2022-04-12 MED ORDER — DULOXETINE HCL 60 MG PO CPEP: 60.0000 mg | ORAL_CAPSULE | Freq: Every day | ORAL | 0 refills | Status: DC

## 2022-04-18 DIAGNOSIS — N319 Neuromuscular dysfunction of bladder, unspecified: Secondary | ICD-10-CM | POA: Diagnosis not present

## 2022-04-18 DIAGNOSIS — F411 Generalized anxiety disorder: Secondary | ICD-10-CM | POA: Diagnosis not present

## 2022-04-18 DIAGNOSIS — G2581 Restless legs syndrome: Secondary | ICD-10-CM | POA: Diagnosis not present

## 2022-04-18 DIAGNOSIS — M47814 Spondylosis without myelopathy or radiculopathy, thoracic region: Secondary | ICD-10-CM | POA: Diagnosis not present

## 2022-04-18 DIAGNOSIS — K219 Gastro-esophageal reflux disease without esophagitis: Secondary | ICD-10-CM | POA: Diagnosis not present

## 2022-04-18 DIAGNOSIS — I82441 Acute embolism and thrombosis of right tibial vein: Secondary | ICD-10-CM | POA: Diagnosis not present

## 2022-04-18 DIAGNOSIS — F5101 Primary insomnia: Secondary | ICD-10-CM | POA: Diagnosis not present

## 2022-04-18 DIAGNOSIS — Z9350 Unspecified cystostomy status: Secondary | ICD-10-CM | POA: Diagnosis not present

## 2022-04-18 DIAGNOSIS — I700 Atherosclerosis of aorta: Secondary | ICD-10-CM | POA: Diagnosis not present

## 2022-04-18 DIAGNOSIS — N189 Chronic kidney disease, unspecified: Secondary | ICD-10-CM | POA: Diagnosis not present

## 2022-04-18 DIAGNOSIS — J9601 Acute respiratory failure with hypoxia: Secondary | ICD-10-CM | POA: Diagnosis not present

## 2022-04-18 DIAGNOSIS — M4802 Spinal stenosis, cervical region: Secondary | ICD-10-CM | POA: Diagnosis not present

## 2022-04-18 DIAGNOSIS — F331 Major depressive disorder, recurrent, moderate: Secondary | ICD-10-CM | POA: Diagnosis not present

## 2022-04-18 DIAGNOSIS — R7303 Prediabetes: Secondary | ICD-10-CM | POA: Diagnosis not present

## 2022-04-18 DIAGNOSIS — G40909 Epilepsy, unspecified, not intractable, without status epilepticus: Secondary | ICD-10-CM | POA: Diagnosis not present

## 2022-04-18 DIAGNOSIS — G8222 Paraplegia, incomplete: Secondary | ICD-10-CM | POA: Diagnosis not present

## 2022-04-18 DIAGNOSIS — Z466 Encounter for fitting and adjustment of urinary device: Secondary | ICD-10-CM | POA: Diagnosis not present

## 2022-04-18 DIAGNOSIS — M170 Bilateral primary osteoarthritis of knee: Secondary | ICD-10-CM | POA: Diagnosis not present

## 2022-04-18 DIAGNOSIS — M810 Age-related osteoporosis without current pathological fracture: Secondary | ICD-10-CM | POA: Diagnosis not present

## 2022-04-18 DIAGNOSIS — E46 Unspecified protein-calorie malnutrition: Secondary | ICD-10-CM | POA: Diagnosis not present

## 2022-04-18 DIAGNOSIS — Z7901 Long term (current) use of anticoagulants: Secondary | ICD-10-CM | POA: Diagnosis not present

## 2022-04-18 DIAGNOSIS — Z7952 Long term (current) use of systemic steroids: Secondary | ICD-10-CM | POA: Diagnosis not present

## 2022-04-18 DIAGNOSIS — M47812 Spondylosis without myelopathy or radiculopathy, cervical region: Secondary | ICD-10-CM | POA: Diagnosis not present

## 2022-04-18 DIAGNOSIS — G894 Chronic pain syndrome: Secondary | ICD-10-CM | POA: Diagnosis not present

## 2022-04-19 DIAGNOSIS — R197 Diarrhea, unspecified: Secondary | ICD-10-CM | POA: Diagnosis not present

## 2022-04-20 ENCOUNTER — Other Ambulatory Visit: Payer: Self-pay | Admitting: Neurology

## 2022-04-20 DIAGNOSIS — G8222 Paraplegia, incomplete: Secondary | ICD-10-CM

## 2022-04-20 DIAGNOSIS — G0490 Encephalitis and encephalomyelitis, unspecified: Secondary | ICD-10-CM

## 2022-04-20 DIAGNOSIS — R339 Retention of urine, unspecified: Secondary | ICD-10-CM | POA: Diagnosis not present

## 2022-04-23 ENCOUNTER — Encounter: Payer: Self-pay | Admitting: Neurology

## 2022-04-23 ENCOUNTER — Ambulatory Visit: Payer: PPO | Admitting: Neurology

## 2022-04-23 DIAGNOSIS — G8222 Paraplegia, incomplete: Secondary | ICD-10-CM | POA: Diagnosis not present

## 2022-04-23 DIAGNOSIS — N189 Chronic kidney disease, unspecified: Secondary | ICD-10-CM | POA: Diagnosis not present

## 2022-04-23 DIAGNOSIS — M170 Bilateral primary osteoarthritis of knee: Secondary | ICD-10-CM | POA: Diagnosis not present

## 2022-04-23 DIAGNOSIS — M47812 Spondylosis without myelopathy or radiculopathy, cervical region: Secondary | ICD-10-CM | POA: Diagnosis not present

## 2022-04-23 DIAGNOSIS — Z466 Encounter for fitting and adjustment of urinary device: Secondary | ICD-10-CM | POA: Diagnosis not present

## 2022-04-23 DIAGNOSIS — F411 Generalized anxiety disorder: Secondary | ICD-10-CM | POA: Diagnosis not present

## 2022-04-23 DIAGNOSIS — F331 Major depressive disorder, recurrent, moderate: Secondary | ICD-10-CM | POA: Diagnosis not present

## 2022-04-23 DIAGNOSIS — N319 Neuromuscular dysfunction of bladder, unspecified: Secondary | ICD-10-CM | POA: Diagnosis not present

## 2022-04-23 DIAGNOSIS — M47814 Spondylosis without myelopathy or radiculopathy, thoracic region: Secondary | ICD-10-CM | POA: Diagnosis not present

## 2022-04-23 DIAGNOSIS — M4802 Spinal stenosis, cervical region: Secondary | ICD-10-CM | POA: Diagnosis not present

## 2022-04-23 DIAGNOSIS — I700 Atherosclerosis of aorta: Secondary | ICD-10-CM | POA: Diagnosis not present

## 2022-04-23 DIAGNOSIS — M810 Age-related osteoporosis without current pathological fracture: Secondary | ICD-10-CM | POA: Diagnosis not present

## 2022-04-24 DIAGNOSIS — J168 Pneumonia due to other specified infectious organisms: Secondary | ICD-10-CM | POA: Diagnosis not present

## 2022-04-24 DIAGNOSIS — J9601 Acute respiratory failure with hypoxia: Secondary | ICD-10-CM | POA: Diagnosis not present

## 2022-04-24 DIAGNOSIS — G8222 Paraplegia, incomplete: Secondary | ICD-10-CM | POA: Diagnosis not present

## 2022-04-29 ENCOUNTER — Emergency Department (HOSPITAL_COMMUNITY): Payer: PPO

## 2022-04-29 ENCOUNTER — Other Ambulatory Visit: Payer: Self-pay

## 2022-04-29 ENCOUNTER — Emergency Department (HOSPITAL_COMMUNITY)
Admission: EM | Admit: 2022-04-29 | Discharge: 2022-04-29 | Disposition: A | Discharge status: Home / Self Care | Payer: PPO | Attending: Emergency Medicine | Admitting: Emergency Medicine

## 2022-04-29 DIAGNOSIS — S5002XA Contusion of left elbow, initial encounter: Secondary | ICD-10-CM | POA: Diagnosis not present

## 2022-04-29 DIAGNOSIS — R079 Chest pain, unspecified: Secondary | ICD-10-CM | POA: Diagnosis not present

## 2022-04-29 DIAGNOSIS — S199XXA Unspecified injury of neck, initial encounter: Secondary | ICD-10-CM | POA: Diagnosis not present

## 2022-04-29 DIAGNOSIS — M4312 Spondylolisthesis, cervical region: Secondary | ICD-10-CM | POA: Diagnosis not present

## 2022-04-29 DIAGNOSIS — G319 Degenerative disease of nervous system, unspecified: Secondary | ICD-10-CM | POA: Diagnosis not present

## 2022-04-29 DIAGNOSIS — W19XXXA Unspecified fall, initial encounter: Secondary | ICD-10-CM | POA: Insufficient documentation

## 2022-04-29 DIAGNOSIS — R102 Pelvic and perineal pain: Secondary | ICD-10-CM | POA: Diagnosis not present

## 2022-04-29 DIAGNOSIS — S0990XA Unspecified injury of head, initial encounter: Secondary | ICD-10-CM | POA: Diagnosis not present

## 2022-04-29 DIAGNOSIS — M25522 Pain in left elbow: Secondary | ICD-10-CM | POA: Diagnosis not present

## 2022-04-29 DIAGNOSIS — M47812 Spondylosis without myelopathy or radiculopathy, cervical region: Secondary | ICD-10-CM | POA: Diagnosis not present

## 2022-04-29 DIAGNOSIS — Z79899 Other long term (current) drug therapy: Secondary | ICD-10-CM | POA: Insufficient documentation

## 2022-04-29 DIAGNOSIS — S80212A Abrasion, left knee, initial encounter: Secondary | ICD-10-CM | POA: Diagnosis not present

## 2022-04-29 DIAGNOSIS — S0083XA Contusion of other part of head, initial encounter: Secondary | ICD-10-CM | POA: Diagnosis not present

## 2022-04-29 DIAGNOSIS — M25559 Pain in unspecified hip: Secondary | ICD-10-CM | POA: Diagnosis not present

## 2022-04-29 DIAGNOSIS — R531 Weakness: Secondary | ICD-10-CM | POA: Diagnosis not present

## 2022-04-29 DIAGNOSIS — S59902A Unspecified injury of left elbow, initial encounter: Secondary | ICD-10-CM | POA: Diagnosis present

## 2022-04-29 DIAGNOSIS — I6782 Cerebral ischemia: Secondary | ICD-10-CM | POA: Diagnosis not present

## 2022-04-29 DIAGNOSIS — Z7901 Long term (current) use of anticoagulants: Secondary | ICD-10-CM | POA: Diagnosis not present

## 2022-04-29 DIAGNOSIS — Z7401 Bed confinement status: Secondary | ICD-10-CM | POA: Diagnosis not present

## 2022-04-29 DIAGNOSIS — M25562 Pain in left knee: Secondary | ICD-10-CM | POA: Diagnosis not present

## 2022-04-29 MED ORDER — ACETAMINOPHEN 500 MG PO TABS
1000.0000 mg | ORAL_TABLET | Freq: Once | ORAL | Status: DC
  Filled 2022-04-29: qty 2

## 2022-04-29 MED ORDER — OXYCODONE HCL 5 MG PO TABS
5.0000 mg | ORAL_TABLET | Freq: Once | ORAL | Status: AC
  Administered 2022-04-29: 5 mg via ORAL
  Filled 2022-04-29: qty 1

## 2022-04-29 NOTE — Progress Notes (Signed)
Orthopedic Tech Progress Note Patient Details:  Katelyn Lamb 18-Sep-1945 [id number]  Patient ID: Katelyn Lamb, female   DOB: [birth year], 76 y.o.   MRN: [id number] Level II; not needed. Katelyn Lamb 04/29/2022, 1:23 PM

## 2022-04-29 NOTE — ED Notes (Signed)
[facility] called; fourth in line.

## 2022-04-29 NOTE — Discharge Instructions (Signed)
Return for any problem.  ?

## 2022-04-29 NOTE — ED Triage Notes (Signed)
Pt BIB EMS from home for a fall. Pt was being transferred from her bed with assistance when they slipped and fell. A small hematoma to the right forehead and a large hematoma to the left elbow. Pt c/o left hip pain without any clear deformity    146/86 88HR  98%

## 2022-04-29 NOTE — Progress Notes (Signed)
   04/29/22 1300  Clinical Encounter Type  Visited With Patient  Visit Type ED  Referral From Nurse  Consult/Referral To Chaplain  Spiritual Encounters  Spiritual Needs Emotional  Stress Factors  Patient Stress Factors Loss of control  Family Stress Factors None identified   Chaplain met  with patient upon entering ED  Provided spiritual and emotional care for patient .

## 2022-04-29 NOTE — ED Notes (Signed)
Trauma Response Nurse Documentation   Katelyn Lamb is a 76 y.o. female arriving to [name] via [facility]  On Eliquis (apixaban) daily. Trauma was activated as a Level 2 by chg based on the following trauma criteria Elderly patients > 65 with head trauma on anti-coagulation (excluding ASA). Trauma team at the bedside on patient arrival.   Patient cleared for CT by Dr. Francia Greaves.  GCS 15.  History   Past Medical History:  Diagnosis Date   Acute deep vein thrombosis (DVT) of popliteal vein of left lower extremity (HCC)    Anxiety    Back pain    Chronic kidney disease    Encephalomyelitis    Gait abnormality 11/14/2016   Memory difficulty 03/02/2019   Myelitis due to herpes simplex [facility])    Neurogenic bladder      Past Surgical History:  Procedure Laterality Date   ABDOMINAL HYSTERECTOMY     BLADDER REPAIR     CESAREAN SECTION     IR ANGIO EXTERNAL CAROTID SEL EXT CAROTID BILAT MOD SED  07/18/2021   IR ANGIO INTRA EXTRACRAN SEL INTERNAL CAROTID BILAT MOD SED  07/18/2021   IR KYPHO LUMBAR INC FX REDUCE BONE BX UNI/BIL CANNULATION INC/IMAGING  04/11/2018   IR NEURO EACH ADD'L AFTER BASIC UNI LEFT (MS)  07/18/2021   IR NEURO EACH ADD'L AFTER BASIC UNI RIGHT (MS)  07/18/2021   IR TRANSCATH/EMBOLIZ  07/18/2021   IR US GUIDE VASC ACCESS RIGHT  07/18/2021   RADIOLOGY WITH ANESTHESIA N/A 07/18/2021   Procedure: IR WITH ANESTHESIA;  Surgeon: Pedro Earls, MD;  Location: [facility];  Service: Radiology;  Laterality: N/A;   TUBAL LIGATION         Initial Focused Assessment (If applicable, or please see trauma documentation):  Airway-- CLear Breathing -- unlabored Circulation-- no bleeding Alert/Oriented X 4   CT's Completed:   CT Head   Interventions:  Xrays CT scans    Bedside handoff with ED Katelyn Lamb Katelyn Lamb, Katelyn Lamb.    Katelyn Lamb  Trauma Response Katelyn Lamb  Please call TRN at [phone] for further assistance.

## 2022-04-29 NOTE — ED Provider Notes (Signed)
[facility] EMERGENCY DEPARTMENT Provider Note   CSN: [id number] Arrival date & time: 04/29/22  1322     History  Chief Complaint  Patient presents with   Fall on Kaley Jutras I Montesano is a 76 y.o. female.  76 year old female with prior medical history as detailed below presents for evaluation.  Patient presents after reported fall.  Patient requires assistance for transfers.  Per EMS, patient was being transferred from bed to toilet when the patient's attendant lost her balance and the patient and the attendant both fell to the floor.  Patient with hematoma to the left elbow.  Patient did strike her head.  Patient without loss conscious.  Patient is on Eliquis.  Patient is comfortable and without specific complaint on evaluation.  Superficial abrasion noted to the left knee.  The history is provided by the patient and medical records.       Home Medications Prior to Admission medications   Medication Sig Start Date End Date Taking? Authorizing Provider  acyclovir (ZOVIRAX) 400 MG tablet Take 1 tablet (400 mg total) by mouth 2 (two) times daily. Please call and make overdue appt for further refills, 1st attempt 04/24/22   Britt Bottom, MD  albuterol (PROVENTIL) (2.5 MG/3ML) 0.083% nebulizer solution Take 3 mLs (2.5 mg total) by nebulization every 2 (two) hours as needed for wheezing or shortness of breath. 08/15/21   Ghimire, Henreitta Leber, MD  apixaban (ELIQUIS) 5 MG TABS tablet Take 1 tablet (5 mg total) by mouth 2 (two) times daily. 08/15/21   Ghimire, Henreitta Leber, MD  Ascorbic Acid (VITAMIN C) 500 MG CHEW Chew 500 mg by mouth daily.    [provider]  Calcium Carb-Cholecalciferol (CALCIUM 600 + D PO) Take 1 tablet by mouth in the morning and at bedtime.    [provider]  Cyanocobalamin (VITAMIN B-12 PO) Take 500 mcg by mouth daily.    [provider]  diazepam (VALIUM) 5 MG tablet TAKE 1 TABLET BY MOUTH EVERY 12 HOURS AS NEEDED  FOR TREMORS 03/27/22   Sater, Nanine Means, MD  docusate sodium (COLACE) 100 MG capsule Take 200 mg by mouth daily as needed for mild constipation.    [provider]  DULoxetine (CYMBALTA) 60 MG capsule Take 1 capsule (60 mg total) by mouth at bedtime. 04/12/22   Sater, Nanine Means, MD  feeding supplement (ENSURE ENLIVE / ENSURE PLUS) LIQD Take 237 mLs by mouth 2 (two) times daily between meals. 08/15/21   Ghimire, Henreitta Leber, MD  ferrous sulfate 325 (65 FE) MG tablet Take 325 mg by mouth every morning. 03/10/21   [provider]  guaiFENesin (MUCINEX) 600 MG 12 hr tablet Take 2 tablets (1,200 mg total) by mouth 2 (two) times daily. 08/15/21   Ghimire, Henreitta Leber, MD  HYDROcodone-acetaminophen (NORCO) 10-325 MG tablet Take 1 tablet by mouth 3 (three) times daily as needed. 08/24/21   [provider]  loperamide (IMODIUM) 2 MG capsule Take 2 mg by mouth daily as needed for diarrhea or loose stools.    [provider]  midodrine (PROAMATINE) 5 MG tablet Take 1 tablet (5 mg total) by mouth 3 (three) times daily with meals. 09/17/21   Shelly Coss, MD  mirabegron ER (MYRBETRIQ) 50 MG TB24 tablet Take 50 mg by mouth at bedtime.    [provider]  omega-3 acid ethyl esters (LOVAZA) 1 g capsule Take 1 g by mouth every morning.    [provider]  pantoprazole (PROTONIX) 40 MG tablet Take 1 tablet (40 mg total) by mouth daily. 10/26/16   Angiulli, Lavon Paganini, PA-C  predniSONE (DELTASONE) 5 MG tablet Take 1 tablet (5 mg total) by mouth daily with breakfast. 11/22/21   Sater, Nanine Means, MD  pregabalin (LYRICA) 50 MG capsule Take 1 capsule (50 mg total) by mouth 2 (two) times daily. 01/23/22   Sater, Nanine Means, MD  QUEtiapine (SEROQUEL) 50 MG tablet Take 1 tablet (50 mg total) by mouth at bedtime. 11/27/21   Sater, Nanine Means, MD  sodium chloride (OCEAN) 0.65 % SOLN nasal spray Place 1 spray into both nostrils as needed for congestion. 08/01/21   Oswald Hillock, MD       Allergies    Demerol [meperidine], Percocet [oxycodone-acetaminophen], Amoxicillin-pot clavulanate, and Penicillins    Review of Systems   Review of Systems  All other systems reviewed and are negative.   Physical Exam Updated Vital Signs SpO2 98%  Physical Exam Vitals and nursing note reviewed.  Constitutional:      General: She is not in acute distress.    Appearance: Normal appearance. She is well-developed.  HENT:     Head: Normocephalic and atraumatic.  Eyes:     Conjunctiva/sclera: Conjunctivae normal.     Pupils: Pupils are equal, round, and reactive to light.  Cardiovascular:     Rate and Rhythm: Normal rate and regular rhythm.     Heart sounds: Normal heart sounds.  Pulmonary:     Effort: Pulmonary effort is normal. No respiratory distress.     Breath sounds: Normal breath sounds.  Abdominal:     General: There is no distension.     Palpations: Abdomen is soft.     Tenderness: There is no abdominal tenderness.  Musculoskeletal:        General: Swelling present. No deformity. Normal range of motion.     Cervical back: Normal range of motion and neck supple.     Comments: Hematoma noted to the lateral aspect of the left elbow.  Full active range of motion of the left elbow noted.  Distal left upper extremity is neurovascular tact.    Superficial abrasion to the anterior aspect of the left knee noted.  Distal bilateral lower extremities are neurovascular tact.    Skin:    General: Skin is warm and dry.  Neurological:     General: No focal deficit present.     Mental Status: She is alert and oriented to person, place, and time.     ED Results / Procedures / Treatments   Labs (all labs ordered are listed, but only abnormal results are displayed) Labs Reviewed - No data to display  EKG None  Radiology No results found.  Procedures Procedures    Medications Ordered in ED Medications - No data to display  ED Course/ Medical Decision Making/ A&P                            Medical Decision Making Amount and/or Complexity of Data Reviewed Radiology: ordered.  Risk Prescription drug management.    Medical Screen Complete  This patient presented to the ED with complaint of fall.  This complaint involves an extensive number of treatment options. The initial differential diagnosis includes, but is not limited to, trauma related to fall  This presentation is: Acute, Self-Limited, Previously Undiagnosed, Uncertain Prognosis, Complicated, Systemic Symptoms, and Threat to Life/Bodily Function  Patient presents after  fall during transfer from chair to toilet.  Patient is on Eliquis.  Patient did strike her head.  Patient without clear evidence of significant traumatic injury on exam.  However, out of an abundance of caution and given patient's status of being anticoagulated -- will obtain CT and plain film imaging.  Imaging obtained is without significant abnormality.  Patient and patient's husband understand need for close outpatient follow-up.  Strict return precautions given understood.  Additional history obtained:  External records from outside sources obtained and reviewed including prior ED visits and prior Inpatient records.    Imaging Studies ordered:  I ordered imaging studies including CT head, CT C-spine, plain films of pelvis, chest, left knee, left elbow I independently visualized and interpreted obtained imaging which showed NAD I agree with the radiologist interpretation.   Cardiac Monitoring:  The patient was maintained on a cardiac monitor.  I personally viewed and interpreted the cardiac monitor which showed an underlying rhythm of: NSR   Medicines ordered:  I ordered medication including oxycodone for pain Reevaluation of the patient after these medicines showed that the patient: improved   Problem List / ED Course:  Fall, contusion   Reevaluation:  After the interventions noted above, I  reevaluated the patient and found that they have: improved     Disposition:  After consideration of the diagnostic results and the patients response to treatment, I feel that the patent would benefit from close outpatient follow-up.          Final Clinical Impression(s) / ED Diagnoses Final diagnoses:  Fall, initial encounter  Contusion of left elbow, initial encounter    Rx / DC Orders ED Discharge Orders     None         Valarie Merino, MD 04/29/22 1520

## 2022-05-13 ENCOUNTER — Other Ambulatory Visit: Payer: Self-pay | Admitting: Neurology

## 2022-05-13 DIAGNOSIS — G9340 Encephalopathy, unspecified: Secondary | ICD-10-CM

## 2022-05-13 DIAGNOSIS — R413 Other amnesia: Secondary | ICD-10-CM

## 2022-05-13 DIAGNOSIS — M792 Neuralgia and neuritis, unspecified: Secondary | ICD-10-CM

## 2022-05-13 DIAGNOSIS — G0490 Encephalitis and encephalomyelitis, unspecified: Secondary | ICD-10-CM

## 2022-05-13 DIAGNOSIS — R269 Unspecified abnormalities of gait and mobility: Secondary | ICD-10-CM

## 2022-05-16 ENCOUNTER — Encounter (HOSPITAL_COMMUNITY): Payer: Self-pay

## 2022-05-16 ENCOUNTER — Emergency Department (HOSPITAL_COMMUNITY)
Admission: EM | Admit: 2022-05-16 | Discharge: 2022-05-17 | Disposition: A | Discharge status: Home / Self Care | Payer: PPO | Attending: Emergency Medicine | Admitting: Emergency Medicine

## 2022-05-16 ENCOUNTER — Emergency Department (HOSPITAL_COMMUNITY): Payer: PPO

## 2022-05-16 DIAGNOSIS — R140 Abdominal distension (gaseous): Secondary | ICD-10-CM | POA: Diagnosis not present

## 2022-05-16 DIAGNOSIS — Z7952 Long term (current) use of systemic steroids: Secondary | ICD-10-CM | POA: Diagnosis not present

## 2022-05-16 DIAGNOSIS — M810 Age-related osteoporosis without current pathological fracture: Secondary | ICD-10-CM | POA: Diagnosis not present

## 2022-05-16 DIAGNOSIS — K219 Gastro-esophageal reflux disease without esophagitis: Secondary | ICD-10-CM | POA: Diagnosis not present

## 2022-05-16 DIAGNOSIS — K6389 Other specified diseases of intestine: Secondary | ICD-10-CM | POA: Diagnosis not present

## 2022-05-16 DIAGNOSIS — Z7901 Long term (current) use of anticoagulants: Secondary | ICD-10-CM | POA: Diagnosis not present

## 2022-05-16 DIAGNOSIS — M47812 Spondylosis without myelopathy or radiculopathy, cervical region: Secondary | ICD-10-CM | POA: Diagnosis not present

## 2022-05-16 DIAGNOSIS — G894 Chronic pain syndrome: Secondary | ICD-10-CM | POA: Diagnosis not present

## 2022-05-16 DIAGNOSIS — F331 Major depressive disorder, recurrent, moderate: Secondary | ICD-10-CM | POA: Diagnosis not present

## 2022-05-16 DIAGNOSIS — N319 Neuromuscular dysfunction of bladder, unspecified: Secondary | ICD-10-CM | POA: Diagnosis not present

## 2022-05-16 DIAGNOSIS — N3000 Acute cystitis without hematuria: Secondary | ICD-10-CM | POA: Diagnosis not present

## 2022-05-16 DIAGNOSIS — Z466 Encounter for fitting and adjustment of urinary device: Secondary | ICD-10-CM | POA: Diagnosis not present

## 2022-05-16 DIAGNOSIS — F5101 Primary insomnia: Secondary | ICD-10-CM | POA: Diagnosis not present

## 2022-05-16 DIAGNOSIS — F0390 Unspecified dementia without behavioral disturbance: Secondary | ICD-10-CM | POA: Diagnosis not present

## 2022-05-16 DIAGNOSIS — Z87440 Personal history of urinary (tract) infections: Secondary | ICD-10-CM | POA: Diagnosis not present

## 2022-05-16 DIAGNOSIS — R079 Chest pain, unspecified: Secondary | ICD-10-CM | POA: Diagnosis not present

## 2022-05-16 DIAGNOSIS — G40909 Epilepsy, unspecified, not intractable, without status epilepticus: Secondary | ICD-10-CM | POA: Diagnosis not present

## 2022-05-16 DIAGNOSIS — M4802 Spinal stenosis, cervical region: Secondary | ICD-10-CM | POA: Diagnosis not present

## 2022-05-16 DIAGNOSIS — E46 Unspecified protein-calorie malnutrition: Secondary | ICD-10-CM | POA: Diagnosis not present

## 2022-05-16 DIAGNOSIS — R0789 Other chest pain: Secondary | ICD-10-CM | POA: Diagnosis not present

## 2022-05-16 DIAGNOSIS — R519 Headache, unspecified: Secondary | ICD-10-CM | POA: Diagnosis not present

## 2022-05-16 DIAGNOSIS — Z86711 Personal history of pulmonary embolism: Secondary | ICD-10-CM | POA: Diagnosis not present

## 2022-05-16 DIAGNOSIS — K5939 Other megacolon: Secondary | ICD-10-CM | POA: Diagnosis not present

## 2022-05-16 DIAGNOSIS — N189 Chronic kidney disease, unspecified: Secondary | ICD-10-CM | POA: Diagnosis not present

## 2022-05-16 DIAGNOSIS — M170 Bilateral primary osteoarthritis of knee: Secondary | ICD-10-CM | POA: Diagnosis not present

## 2022-05-16 DIAGNOSIS — I959 Hypotension, unspecified: Secondary | ICD-10-CM | POA: Diagnosis not present

## 2022-05-16 DIAGNOSIS — R002 Palpitations: Secondary | ICD-10-CM | POA: Diagnosis not present

## 2022-05-16 DIAGNOSIS — I700 Atherosclerosis of aorta: Secondary | ICD-10-CM | POA: Diagnosis not present

## 2022-05-16 DIAGNOSIS — G2581 Restless legs syndrome: Secondary | ICD-10-CM | POA: Diagnosis not present

## 2022-05-16 DIAGNOSIS — Z20822 Contact with and (suspected) exposure to covid-19: Secondary | ICD-10-CM | POA: Insufficient documentation

## 2022-05-16 DIAGNOSIS — Z87891 Personal history of nicotine dependence: Secondary | ICD-10-CM | POA: Diagnosis not present

## 2022-05-16 DIAGNOSIS — G8222 Paraplegia, incomplete: Secondary | ICD-10-CM | POA: Diagnosis not present

## 2022-05-16 DIAGNOSIS — I878 Other specified disorders of veins: Secondary | ICD-10-CM | POA: Diagnosis not present

## 2022-05-16 DIAGNOSIS — M47814 Spondylosis without myelopathy or radiculopathy, thoracic region: Secondary | ICD-10-CM | POA: Diagnosis not present

## 2022-05-16 DIAGNOSIS — F411 Generalized anxiety disorder: Secondary | ICD-10-CM | POA: Diagnosis not present

## 2022-05-16 DIAGNOSIS — Z8701 Personal history of pneumonia (recurrent): Secondary | ICD-10-CM | POA: Diagnosis not present

## 2022-05-16 LAB — URINALYSIS, ROUTINE W REFLEX MICROSCOPIC
Bilirubin Urine: NEGATIVE
Glucose, UA: NEGATIVE mg/dL
Hgb urine dipstick: NEGATIVE
Ketones, ur: NEGATIVE mg/dL
Nitrite: NEGATIVE
Protein, ur: NEGATIVE mg/dL
Specific Gravity, Urine: 1.004 — ABNORMAL LOW (ref 1.005–1.030)
pH: 8 (ref 5.0–8.0)

## 2022-05-16 LAB — CBC
HCT: 41.4 % (ref 36.0–46.0)
Hemoglobin: 13.6 g/dL (ref 12.0–15.0)
MCH: 33.5 pg (ref 26.0–34.0)
MCHC: 32.9 g/dL (ref 30.0–36.0)
MCV: 102 fL — ABNORMAL HIGH (ref 80.0–100.0)
Platelets: 260 10*3/uL (ref 150–400)
RBC: 4.06 MIL/uL (ref 3.87–5.11)
RDW: 13.7 % (ref 11.5–15.5)
WBC: 11.4 10*3/uL — ABNORMAL HIGH (ref 4.0–10.5)
nRBC: 0 % (ref 0.0–0.2)

## 2022-05-16 LAB — SARS CORONAVIRUS 2 BY RT PCR: SARS Coronavirus 2 by RT PCR: NEGATIVE

## 2022-05-16 LAB — COMPREHENSIVE METABOLIC PANEL
ALT: 46 U/L — ABNORMAL HIGH (ref 0–44)
AST: 44 U/L — ABNORMAL HIGH (ref 15–41)
Albumin: 3.2 g/dL — ABNORMAL LOW (ref 3.5–5.0)
Alkaline Phosphatase: 113 U/L (ref 38–126)
Anion gap: 13 (ref 5–15)
BUN: 19 mg/dL (ref 8–23)
CO2: 25 mmol/L (ref 22–32)
Calcium: 9.1 mg/dL (ref 8.9–10.3)
Chloride: 98 mmol/L (ref 98–111)
Creatinine, Ser: 0.74 mg/dL (ref 0.44–1.00)
GFR, Estimated: >60 mL/min (ref >60)
Glucose, Bld: 102 mg/dL — ABNORMAL HIGH (ref 70–99)
Potassium: 4.6 mmol/L (ref 3.5–5.1)
Sodium: 136 mmol/L (ref 135–145)
Total Bilirubin: 0.5 mg/dL (ref 0.3–1.2)
Total Protein: 6.7 g/dL (ref 6.5–8.1)

## 2022-05-16 LAB — TROPONIN I (HIGH SENSITIVITY): Troponin I (High Sensitivity): 7 ng/L (ref <18)

## 2022-05-16 MED ORDER — SULFAMETHOXAZOLE-TRIMETHOPRIM 800-160 MG PO TABS: 1.0000 | ORAL_TABLET | Freq: Two times a day (BID) | ORAL | 0 refills | Status: AC

## 2022-05-16 NOTE — ED Triage Notes (Signed)
Pt bib [facility] from home where she lives with her husband with complaints of chest pressure that started last night. Pt is bed bound at home and states that she wishes she hadnt had to come in because she is "tired of hospitals and just wants whatever it is to take me to heaven". Pt does not want to be stuck with needles. Pt AOx4.

## 2022-05-16 NOTE — Discharge Instructions (Signed)
Please read and follow all provided instructions.  Your diagnoses today include:  1. Abdominal bloating   2. Acute cystitis without hematuria     Tests performed today include: An EKG of your heart An x-ray of the chest and abdomen: shows no problems in the lungs and moderate amount of gas in the bowel, no signs of a bowel blockage Cardiac enzymes - a blood test for heart muscle damage Blood counts and electrolytes COVID test - was negative Urine test - shows infection fighting cells Vital signs. See below for your results today.   Medications prescribed:  Bactrim (trimethoprim/sulfamethoxazole) - antibiotic for urine infection  You have been prescribed an antibiotic medicine: take the entire course of medicine even if you are feeling better. Stopping early can cause the antibiotic not to work.  Take any prescribed medications only as directed.  Follow-up instructions: Please follow-up with your primary care provider as soon as you can for further evaluation of your symptoms.   Return instructions:  SEEK IMMEDIATE MEDICAL ATTENTION IF: You have severe chest pain, especially if the pain is crushing or pressure-like and spreads to the arms, back, neck, or jaw, or if you have sweating, nausea or vomiting, or trouble with breathing. THIS IS AN EMERGENCY. Do not wait to see if the pain will go away. Get medical help at once. Call 911. DO NOT drive yourself to the hospital.  Your chest pain gets worse and does not go away after a few minutes of rest.  You have an attack of chest pain lasting longer than what you usually experience.  You have significant dizziness, if you pass out, or have trouble walking.  You have chest pain not typical of your usual pain for which you originally saw your caregiver.  You have any other emergent concerns regarding your health.  Additional Information: Chest pain comes from many different causes. Your caregiver has diagnosed you as having chest pain that  is not specific for one problem, but does not require admission.  You are at low risk for an acute heart condition or other serious illness.   Your vital signs today were: BP 116/73 (BP Location: Right Arm)   Pulse 75   Temp 99.9 F (37.7 C) (Oral)   Resp 18   SpO2 100%  If your blood pressure (BP) was elevated above 135/85 this visit, please have this repeated by your doctor within one month. --------------

## 2022-05-16 NOTE — ED Notes (Signed)
Transported to XR  

## 2022-05-16 NOTE — ED Provider Notes (Signed)
[facility]PART[facility] Provider Note   CSN: [id number] Arrival date & time: 05/16/22  1809     History  Chief Complaint  Patient presents with   Chest Pain    Katelyn Lamb is a 76 y.o. female.  Patient with medical history significant of DVT on anticoagulation, dementia; seizure, HSV encephalomyelitis, transverse mellitus, neurogenic bladder, bedbound status --presents for evaluation of chest tightness and headache.  Patient reports that last evening prior to going to bed she felt "palpitations" that were in the upper part of her abdomen.  She does not describe this as a pain.  Cannot give other adjectives, but states that she had the same sensation when she awoke today.  She had a home health nurse come out to change her indwelling urinary catheter today.  While they were there, patient mentioned the symptoms and home RN called a doctor who recommended that they come to the emergency department.  Patient states that her symptoms are currently resolved and that she wishes she did not come to the hospital.  Patient's husband reports that patient mentioned having some chest tightness today and also headache.  Patient does report having headache earlier, but relieved after taking naproxen.  No new weakness.  She reports compliance with her medications.  No reported fever, nausea, vomiting.  She does think that her stomach looks to be a little distended.  In addition, patient did have ED presentation for fall at the end of August.  She had imaging of the head and extremities that time which were negative.  She continues to have significant bruising, although improving, to the left elbow.       Home Medications Prior to Admission medications   Medication Sig Start Date End Date Taking? Authorizing Provider  acyclovir (ZOVIRAX) 400 MG tablet Take 1 tablet (400 mg total) by mouth 2 (two) times daily. Please call and make overdue appt for further refills, 1st attempt  04/24/22   Britt Bottom, MD  albuterol (PROVENTIL) (2.5 MG/3ML) 0.083% nebulizer solution Take 3 mLs (2.5 mg total) by nebulization every 2 (two) hours as needed for wheezing or shortness of breath. 08/15/21   Ghimire, Henreitta Leber, MD  apixaban (ELIQUIS) 5 MG TABS tablet Take 1 tablet (5 mg total) by mouth 2 (two) times daily. 08/15/21   Ghimire, Henreitta Leber, MD  Ascorbic Acid (VITAMIN C) 500 MG CHEW Chew 500 mg by mouth daily.    [provider]  Calcium Carb-Cholecalciferol (CALCIUM 600 + D PO) Take 1 tablet by mouth in the morning and at bedtime.    [provider]  Cyanocobalamin (VITAMIN B-12 PO) Take 500 mcg by mouth daily.    [provider]  diazepam (VALIUM) 5 MG tablet TAKE 1 TABLET BY MOUTH EVERY 12 HOURS AS NEEDED FOR TREMORS 03/27/22   Sater, Nanine Means, MD  docusate sodium (COLACE) 100 MG capsule Take 200 mg by mouth daily as needed for mild constipation.    [provider]  DULoxetine (CYMBALTA) 60 MG capsule TAKE 1 CAPSULE (60 MG TOTAL) BY MOUTH AT BEDTIME. 05/13/22   Sater, Nanine Means, MD  feeding supplement (ENSURE ENLIVE / ENSURE PLUS) LIQD Take 237 mLs by mouth 2 (two) times daily between meals. 08/15/21   Ghimire, Henreitta Leber, MD  ferrous sulfate 325 (65 FE) MG tablet Take 325 mg by mouth every morning. 03/10/21   [provider]  guaiFENesin (MUCINEX) 600 MG 12 hr tablet Take 2 tablets (1,200 mg total) by  mouth 2 (two) times daily. 08/15/21   Ghimire, Henreitta Leber, MD  HYDROcodone-acetaminophen (NORCO) 10-325 MG tablet Take 1 tablet by mouth 3 (three) times daily as needed. 08/24/21   [provider]  loperamide (IMODIUM) 2 MG capsule Take 2 mg by mouth daily as needed for diarrhea or loose stools.    [provider]  midodrine (PROAMATINE) 5 MG tablet Take 1 tablet (5 mg total) by mouth 3 (three) times daily with meals. 09/17/21   Shelly Coss, MD  mirabegron ER (MYRBETRIQ) 50 MG TB24 tablet Take 50 mg by mouth at bedtime.     [provider]  omega-3 acid ethyl esters (LOVAZA) 1 g capsule Take 1 g by mouth every morning.    [provider]  pantoprazole (PROTONIX) 40 MG tablet Take 1 tablet (40 mg total) by mouth daily. 10/26/16   Angiulli, Lavon Paganini, PA-C  predniSONE (DELTASONE) 5 MG tablet Take 1 tablet (5 mg total) by mouth daily with breakfast. 11/22/21   Sater, Nanine Means, MD  pregabalin (LYRICA) 50 MG capsule Take 1 capsule (50 mg total) by mouth 2 (two) times daily. 01/23/22   Sater, Nanine Means, MD  QUEtiapine (SEROQUEL) 50 MG tablet Take 1 tablet (50 mg total) by mouth at bedtime. 11/27/21   Sater, Nanine Means, MD  sodium chloride (OCEAN) 0.65 % SOLN nasal spray Place 1 spray into both nostrils as needed for congestion. 08/01/21   Oswald Hillock, MD      Allergies    Demerol [meperidine], Percocet [oxycodone-acetaminophen], Amoxicillin-pot clavulanate, and Penicillins    Review of Systems   Review of Systems  Physical Exam Updated Vital Signs BP (!) 160/137 (BP Location: Right Arm)   Pulse 80   Temp 99.9 F (37.7 C) (Oral)   Resp 20   SpO2 100%   Physical Exam Vitals and nursing note reviewed.  Constitutional:      General: She is not in acute distress.    Appearance: She is well-developed.  HENT:     Head: Normocephalic and atraumatic.     Right Ear: External ear normal.     Left Ear: External ear normal.     Nose: Nose normal.  Eyes:     Conjunctiva/sclera: Conjunctivae normal.  Cardiovascular:     Rate and Rhythm: Normal rate and regular rhythm.     Heart sounds: No murmur heard. Pulmonary:     Effort: No respiratory distress.     Breath sounds: No wheezing, rhonchi or rales.  Abdominal:     Palpations: Abdomen is soft.     Tenderness: There is no abdominal tenderness. There is no guarding or rebound.  Musculoskeletal:     Cervical back: Normal range of motion and neck supple.     Right lower leg: No edema.     Left lower leg: No edema.     Comments: Ecchymosis of the  left left elbow and proximal forearm.   Skin:    General: Skin is warm and dry.     Findings: No rash.  Neurological:     General: No focal deficit present.     Mental Status: She is alert. Mental status is at baseline.     Motor: No weakness.  Psychiatric:        Mood and Affect: Mood normal.     ED Results / Procedures / Treatments   Labs (all labs ordered are listed, but only abnormal results are displayed) Labs Reviewed  CBC - Abnormal; Notable for the  following components:      Result Value   WBC 11.4 (*)    MCV 102.0 (*)    All other components within normal limits  COMPREHENSIVE METABOLIC PANEL - Abnormal; Notable for the following components:   Glucose, Bld 102 (*)    Albumin 3.2 (*)    AST 44 (*)    ALT 46 (*)    All other components within normal limits  URINALYSIS, ROUTINE W REFLEX MICROSCOPIC - Abnormal; Notable for the following components:   APPearance HAZY (*)    Specific Gravity, Urine 1.004 (*)    Leukocytes,Ua LARGE (*)    Bacteria, UA MANY (*)    All other components within normal limits  SARS CORONAVIRUS 2 BY RT PCR  URINE CULTURE  TROPONIN I (HIGH SENSITIVITY)    EKG EKG Interpretation  Date/Time:  Wednesday May 16 2022 18:23:57 EDT Ventricular Rate:  83 PR Interval:  144 QRS Duration: 78 QT Interval:  388 QTC Calculation: 455 R Axis:   13 Text Interpretation: Normal sinus rhythm Low voltage QRS Confirmed by Lajean Saver [phone]) on 05/16/2022 6:28:04 PM  Radiology DG Abdomen Acute W/Chest  Result Date: 05/16/2022 CLINICAL DATA:  Abdominal discomfort and chest tightness. EXAM: DG ABDOMEN ACUTE WITH 1 VIEW CHEST COMPARISON:  Chest x-ray 04/29/2022 FINDINGS: Lungs and costophrenic angles are clear. Cardiomediastinal silhouette is within normal limits. There is no free air under the diaphragm. There is gaseous distention of the colon diffusely. Transverse colon and cecum are mildly dilated. Air seen to the level the rectum. There are  phleboliths in the pelvis. Vertebroplasty changes are seen at multiple lumbar levels. IMPRESSION: There is mild gaseous dilatation of the transverse colon and ascending colon. This can be seen with colonic ileus. Please correlate clinically. No acute cardiopulmonary process. Electronically Signed   By: Ronney Asters M.D.   On: 05/16/2022 19:17    Procedures Procedures    Medications Ordered in ED Medications - No data to display  ED Course/ Medical Decision Making/ A&P    Patient seen and examined. History obtained directly from patient.   Labs/EKG: Ordered CBC, troponin, CMP, UA, COVID testing.  Imaging: Ordered chest x-ray, abdominal x-ray.  Medications/Fluids: None ordered  Most recent vital signs reviewed and are as follows: BP (!) 160/137 (BP Location: Right Arm)   Pulse 80   Temp 99.9 F (37.7 C) (Oral)   Resp 20   SpO2 100%   Initial impression: Chest pain and abdominal discomfort.  Patient states that she currently feels asymptomatic, but does tend to downplay her symptoms.    10:33 PM Earlier, patient was discussed with and seen by Dr. Ashok Cordia.   Reassessment performed. Patient appears stable, no complaints. No CP or HA currently.   Labs personally reviewed and interpreted including: CBC with minimally elevated white blood cell count 11.4, normal hemoglobin; CMP with normal kidney function, normal electrolytes, minimally elevated transaminases; troponin normal today at 7; UA does demonstrate signs of infection with 21-50 white blood cells, large leukocyte esterase, many bacteria, 0-5 squamous epithelial cells.  I have sent urine culture.  Imaging personally visualized and interpreted including: Chest x-ray agree negative, abdominal x-ray agree gaseous distention but no signs of bowel obstruction.  Reviewed pertinent lab work and imaging with patient and family at bedside. Questions answered.   Patient will be given a prescription for Bactrim.  She has had UTIs in the  past.  Most recently, the end of 2022, she grew out Klebsiella that was resistant only  to penicillin.  However, prior to this she has had ESBL infections.  Culture will need to be followed up.   Discussed 2 options for the patient.  She can closely monitor her temperature at home and begin antibiotics if symptoms worsen or temperature exceeds 100.4 F, or she can start the antibiotics sooner and follow-up with PCP.  Most current vital signs reviewed and are as follows: BP 116/73 (BP Location: Right Arm)   Pulse 75   Temp 99.9 F (37.7 C) (Oral)   Resp 18   SpO2 100%   Plan: Discharge to home.   Prescriptions written for: Bactrim  Other home care instructions discussed:   ED return instructions discussed: Return with fever, vomiting, abdominal or flank pain.   I encouraged patient to return to ED with severe chest pain, especially if the pain is crushing or pressure-like and spreads to the arms, back, neck, or jaw, or if they have associated sweating, vomiting, or shortness of breath with the pain, or significant pain with activity. We discussed that the evaluation here today indicates a low-risk of serious cause of chest pain, including heart trouble or a blood clot, but no evaluation is perfect and chest pain can evolve with time. The patient verbalized understanding and agreed.  I encouraged patient to follow-up with their provider in the next 48 hours for recheck.                            Medical Decision Making Amount and/or Complexity of Data Reviewed Labs: ordered. Radiology: ordered.  Risk Prescription drug management.   For this patient's complaint of chest pain, the following emergent conditions were considered on the differential diagnosis: acute coronary syndrome, pulmonary embolism, pneumothorax, myocarditis, pericardial tamponade, aortic dissection, thoracic aortic aneurysm complication, esophageal perforation.   Other causes were also considered including:  gastroesophageal reflux disease, musculoskeletal pain including costochondritis, pneumonia/pleurisy, herpes zoster, pericarditis.  In regards to possibility of ACS, patient has atypical features of pain, non-ischemic and unchanged EKG and negative troponin(s).   In regards to possibility of PE, symptoms are atypical for PE and risk profile is low, making PE low likelihood.   For this patient's complaint of abdominal discomfort, the following conditions were considered on the differential diagnosis: gastritis/PUD, enteritis/duodenitis, appendicitis, cholelithiasis/cholecystitis, cholangitis, pancreatitis, ruptured viscus, colitis, diverticulitis, small/large bowel obstruction, proctitis, cystitis, pyelonephritis, ureteral colic, aortic dissection, aortic aneurysm. In women, ectopic pregnancy, pelvic inflammatory disease, ovarian cysts, and tubo-ovarian abscess were also considered. Atypical chest etiologies were also considered including ACS, PE, and pneumonia.   Overall work-up is reassuring.  There is some difficulty in interpreting UA as patient has indwelling Foley catheter.  Borderline temperature tonight at 99.9 F but no typical symptoms for UTI.  Will need close monitoring.  The patient's vital signs, pertinent lab work and imaging were reviewed and interpreted as discussed in the ED course. Hospitalization was considered for further testing, treatments, or serial exams/observation. However as patient is well-appearing, has a stable exam, and reassuring studies today, I do not feel that they warrant admission at this time. This plan was discussed with the patient who verbalizes agreement and comfort with this plan and seems reliable and able to return to the Emergency Department with worsening or changing symptoms.          Final Clinical Impression(s) / ED Diagnoses Final diagnoses:  Abdominal bloating  Acute cystitis without hematuria    Rx / DC Orders ED Discharge Orders  Ordered    sulfamethoxazole-trimethoprim (BACTRIM DS) 800-160 MG tablet  2 times daily        05/16/22 2221              Carlisle Cater, PA-C 05/16/22 2238    Lajean Saver, MD 05/18/22 1452

## 2022-05-17 DIAGNOSIS — R4182 Altered mental status, unspecified: Secondary | ICD-10-CM | POA: Diagnosis not present

## 2022-05-17 DIAGNOSIS — Z743 Need for continuous supervision: Secondary | ICD-10-CM | POA: Diagnosis not present

## 2022-05-17 LAB — URINE CULTURE

## 2022-05-19 ENCOUNTER — Other Ambulatory Visit: Payer: Self-pay | Admitting: Neurology

## 2022-05-19 DIAGNOSIS — G8222 Paraplegia, incomplete: Secondary | ICD-10-CM

## 2022-05-19 DIAGNOSIS — G0490 Encephalitis and encephalomyelitis, unspecified: Secondary | ICD-10-CM

## 2022-05-24 DIAGNOSIS — R339 Retention of urine, unspecified: Secondary | ICD-10-CM | POA: Diagnosis not present

## 2022-05-25 DIAGNOSIS — G8222 Paraplegia, incomplete: Secondary | ICD-10-CM | POA: Diagnosis not present

## 2022-05-25 DIAGNOSIS — J168 Pneumonia due to other specified infectious organisms: Secondary | ICD-10-CM | POA: Diagnosis not present

## 2022-05-25 DIAGNOSIS — J9601 Acute respiratory failure with hypoxia: Secondary | ICD-10-CM | POA: Diagnosis not present

## 2022-06-08 DIAGNOSIS — D509 Iron deficiency anemia, unspecified: Secondary | ICD-10-CM | POA: Diagnosis not present

## 2022-06-08 DIAGNOSIS — R2689 Other abnormalities of gait and mobility: Secondary | ICD-10-CM | POA: Diagnosis not present

## 2022-06-08 DIAGNOSIS — R54 Age-related physical debility: Secondary | ICD-10-CM | POA: Diagnosis not present

## 2022-06-08 DIAGNOSIS — F331 Major depressive disorder, recurrent, moderate: Secondary | ICD-10-CM | POA: Diagnosis not present

## 2022-06-08 DIAGNOSIS — I82502 Chronic embolism and thrombosis of unspecified deep veins of left lower extremity: Secondary | ICD-10-CM | POA: Diagnosis not present

## 2022-06-08 DIAGNOSIS — Z9359 Other cystostomy status: Secondary | ICD-10-CM | POA: Diagnosis not present

## 2022-06-10 ENCOUNTER — Other Ambulatory Visit: Payer: Self-pay | Admitting: Neurology

## 2022-06-10 DIAGNOSIS — G8222 Paraplegia, incomplete: Secondary | ICD-10-CM

## 2022-06-10 DIAGNOSIS — G0490 Encephalitis and encephalomyelitis, unspecified: Secondary | ICD-10-CM

## 2022-06-19 DIAGNOSIS — Z466 Encounter for fitting and adjustment of urinary device: Secondary | ICD-10-CM | POA: Diagnosis not present

## 2022-06-19 DIAGNOSIS — N189 Chronic kidney disease, unspecified: Secondary | ICD-10-CM | POA: Diagnosis not present

## 2022-06-19 DIAGNOSIS — N319 Neuromuscular dysfunction of bladder, unspecified: Secondary | ICD-10-CM | POA: Diagnosis not present

## 2022-06-19 DIAGNOSIS — F411 Generalized anxiety disorder: Secondary | ICD-10-CM | POA: Diagnosis not present

## 2022-06-19 DIAGNOSIS — Z86711 Personal history of pulmonary embolism: Secondary | ICD-10-CM | POA: Diagnosis not present

## 2022-06-19 DIAGNOSIS — M170 Bilateral primary osteoarthritis of knee: Secondary | ICD-10-CM | POA: Diagnosis not present

## 2022-06-19 DIAGNOSIS — K219 Gastro-esophageal reflux disease without esophagitis: Secondary | ICD-10-CM | POA: Diagnosis not present

## 2022-06-19 DIAGNOSIS — M4802 Spinal stenosis, cervical region: Secondary | ICD-10-CM | POA: Diagnosis not present

## 2022-06-19 DIAGNOSIS — G8222 Paraplegia, incomplete: Secondary | ICD-10-CM | POA: Diagnosis not present

## 2022-06-19 DIAGNOSIS — F331 Major depressive disorder, recurrent, moderate: Secondary | ICD-10-CM | POA: Diagnosis not present

## 2022-06-19 DIAGNOSIS — G2581 Restless legs syndrome: Secondary | ICD-10-CM | POA: Diagnosis not present

## 2022-06-19 DIAGNOSIS — Z7952 Long term (current) use of systemic steroids: Secondary | ICD-10-CM | POA: Diagnosis not present

## 2022-06-19 DIAGNOSIS — G894 Chronic pain syndrome: Secondary | ICD-10-CM | POA: Diagnosis not present

## 2022-06-19 DIAGNOSIS — E46 Unspecified protein-calorie malnutrition: Secondary | ICD-10-CM | POA: Diagnosis not present

## 2022-06-19 DIAGNOSIS — I700 Atherosclerosis of aorta: Secondary | ICD-10-CM | POA: Diagnosis not present

## 2022-06-19 DIAGNOSIS — Z87891 Personal history of nicotine dependence: Secondary | ICD-10-CM | POA: Diagnosis not present

## 2022-06-19 DIAGNOSIS — M810 Age-related osteoporosis without current pathological fracture: Secondary | ICD-10-CM | POA: Diagnosis not present

## 2022-06-19 DIAGNOSIS — Z87440 Personal history of urinary (tract) infections: Secondary | ICD-10-CM | POA: Diagnosis not present

## 2022-06-19 DIAGNOSIS — F5101 Primary insomnia: Secondary | ICD-10-CM | POA: Diagnosis not present

## 2022-06-19 DIAGNOSIS — M47814 Spondylosis without myelopathy or radiculopathy, thoracic region: Secondary | ICD-10-CM | POA: Diagnosis not present

## 2022-06-19 DIAGNOSIS — M47812 Spondylosis without myelopathy or radiculopathy, cervical region: Secondary | ICD-10-CM | POA: Diagnosis not present

## 2022-06-19 DIAGNOSIS — G40909 Epilepsy, unspecified, not intractable, without status epilepticus: Secondary | ICD-10-CM | POA: Diagnosis not present

## 2022-06-19 DIAGNOSIS — Z8701 Personal history of pneumonia (recurrent): Secondary | ICD-10-CM | POA: Diagnosis not present

## 2022-06-19 DIAGNOSIS — Z7901 Long term (current) use of anticoagulants: Secondary | ICD-10-CM | POA: Diagnosis not present

## 2022-06-22 DIAGNOSIS — G8222 Paraplegia, incomplete: Secondary | ICD-10-CM | POA: Diagnosis not present

## 2022-06-22 DIAGNOSIS — N319 Neuromuscular dysfunction of bladder, unspecified: Secondary | ICD-10-CM | POA: Diagnosis not present

## 2022-06-22 DIAGNOSIS — Z435 Encounter for attention to cystostomy: Secondary | ICD-10-CM | POA: Diagnosis not present

## 2022-06-22 DIAGNOSIS — Z466 Encounter for fitting and adjustment of urinary device: Secondary | ICD-10-CM | POA: Diagnosis not present

## 2022-06-22 DIAGNOSIS — R339 Retention of urine, unspecified: Secondary | ICD-10-CM | POA: Diagnosis not present

## 2022-06-22 DIAGNOSIS — M4802 Spinal stenosis, cervical region: Secondary | ICD-10-CM | POA: Diagnosis not present

## 2022-06-22 DIAGNOSIS — M47814 Spondylosis without myelopathy or radiculopathy, thoracic region: Secondary | ICD-10-CM | POA: Diagnosis not present

## 2022-06-22 DIAGNOSIS — M170 Bilateral primary osteoarthritis of knee: Secondary | ICD-10-CM | POA: Diagnosis not present

## 2022-06-22 DIAGNOSIS — I700 Atherosclerosis of aorta: Secondary | ICD-10-CM | POA: Diagnosis not present

## 2022-06-22 DIAGNOSIS — N189 Chronic kidney disease, unspecified: Secondary | ICD-10-CM | POA: Diagnosis not present

## 2022-06-22 DIAGNOSIS — M47812 Spondylosis without myelopathy or radiculopathy, cervical region: Secondary | ICD-10-CM | POA: Diagnosis not present

## 2022-06-22 DIAGNOSIS — F331 Major depressive disorder, recurrent, moderate: Secondary | ICD-10-CM | POA: Diagnosis not present

## 2022-06-22 DIAGNOSIS — M810 Age-related osteoporosis without current pathological fracture: Secondary | ICD-10-CM | POA: Diagnosis not present

## 2022-06-24 DIAGNOSIS — J168 Pneumonia due to other specified infectious organisms: Secondary | ICD-10-CM | POA: Diagnosis not present

## 2022-06-24 DIAGNOSIS — J9601 Acute respiratory failure with hypoxia: Secondary | ICD-10-CM | POA: Diagnosis not present

## 2022-06-24 DIAGNOSIS — G8222 Paraplegia, incomplete: Secondary | ICD-10-CM | POA: Diagnosis not present

## 2022-06-25 ENCOUNTER — Other Ambulatory Visit: Payer: Self-pay | Admitting: Neurology

## 2022-06-26 ENCOUNTER — Emergency Department (HOSPITAL_COMMUNITY): Payer: PPO

## 2022-06-26 ENCOUNTER — Encounter (HOSPITAL_COMMUNITY): Payer: Self-pay

## 2022-06-26 ENCOUNTER — Inpatient Hospital Stay (HOSPITAL_COMMUNITY)
Admission: EM | Admit: 2022-06-26 | Discharge: 2022-07-03 | DRG: 698 | Disposition: A | Discharge status: Home Health | Payer: PPO | Attending: Student | Admitting: Student

## 2022-06-26 DIAGNOSIS — R6521 Severe sepsis with septic shock: Secondary | ICD-10-CM | POA: Diagnosis present

## 2022-06-26 DIAGNOSIS — Z86711 Personal history of pulmonary embolism: Secondary | ICD-10-CM

## 2022-06-26 DIAGNOSIS — R000 Tachycardia, unspecified: Secondary | ICD-10-CM | POA: Diagnosis not present

## 2022-06-26 DIAGNOSIS — R578 Other shock: Secondary | ICD-10-CM | POA: Diagnosis not present

## 2022-06-26 DIAGNOSIS — Z8249 Family history of ischemic heart disease and other diseases of the circulatory system: Secondary | ICD-10-CM | POA: Diagnosis not present

## 2022-06-26 DIAGNOSIS — R310 Gross hematuria: Secondary | ICD-10-CM

## 2022-06-26 DIAGNOSIS — D62 Acute posthemorrhagic anemia: Secondary | ICD-10-CM | POA: Diagnosis not present

## 2022-06-26 DIAGNOSIS — R319 Hematuria, unspecified: Secondary | ICD-10-CM | POA: Diagnosis not present

## 2022-06-26 DIAGNOSIS — R739 Hyperglycemia, unspecified: Secondary | ICD-10-CM | POA: Diagnosis present

## 2022-06-26 DIAGNOSIS — K5900 Constipation, unspecified: Secondary | ICD-10-CM | POA: Diagnosis not present

## 2022-06-26 DIAGNOSIS — G8222 Paraplegia, incomplete: Secondary | ICD-10-CM | POA: Diagnosis present

## 2022-06-26 DIAGNOSIS — Z8619 Personal history of other infectious and parasitic diseases: Secondary | ICD-10-CM

## 2022-06-26 DIAGNOSIS — A419 Sepsis, unspecified organism: Secondary | ICD-10-CM | POA: Diagnosis present

## 2022-06-26 DIAGNOSIS — I2699 Other pulmonary embolism without acute cor pulmonale: Secondary | ICD-10-CM | POA: Diagnosis not present

## 2022-06-26 DIAGNOSIS — R0902 Hypoxemia: Secondary | ICD-10-CM | POA: Diagnosis not present

## 2022-06-26 DIAGNOSIS — K649 Unspecified hemorrhoids: Secondary | ICD-10-CM | POA: Diagnosis present

## 2022-06-26 DIAGNOSIS — R338 Other retention of urine: Secondary | ICD-10-CM | POA: Diagnosis not present

## 2022-06-26 DIAGNOSIS — D72825 Bandemia: Secondary | ICD-10-CM | POA: Diagnosis not present

## 2022-06-26 DIAGNOSIS — F419 Anxiety disorder, unspecified: Secondary | ICD-10-CM | POA: Diagnosis not present

## 2022-06-26 DIAGNOSIS — Z87891 Personal history of nicotine dependence: Secondary | ICD-10-CM | POA: Diagnosis not present

## 2022-06-26 DIAGNOSIS — N3001 Acute cystitis with hematuria: Secondary | ICD-10-CM | POA: Diagnosis not present

## 2022-06-26 DIAGNOSIS — I959 Hypotension, unspecified: Secondary | ICD-10-CM | POA: Diagnosis not present

## 2022-06-26 DIAGNOSIS — Z789 Other specified health status: Secondary | ICD-10-CM

## 2022-06-26 DIAGNOSIS — Z9359 Other cystostomy status: Secondary | ICD-10-CM

## 2022-06-26 DIAGNOSIS — R6510 Systemic inflammatory response syndrome (SIRS) of non-infectious origin without acute organ dysfunction: Secondary | ICD-10-CM

## 2022-06-26 DIAGNOSIS — Z7901 Long term (current) use of anticoagulants: Secondary | ICD-10-CM

## 2022-06-26 DIAGNOSIS — G894 Chronic pain syndrome: Secondary | ICD-10-CM | POA: Diagnosis present

## 2022-06-26 DIAGNOSIS — Z86718 Personal history of other venous thrombosis and embolism: Secondary | ICD-10-CM

## 2022-06-26 DIAGNOSIS — N319 Neuromuscular dysfunction of bladder, unspecified: Secondary | ICD-10-CM | POA: Diagnosis not present

## 2022-06-26 DIAGNOSIS — G40909 Epilepsy, unspecified, not intractable, without status epilepticus: Secondary | ICD-10-CM | POA: Diagnosis not present

## 2022-06-26 DIAGNOSIS — T83090A Other mechanical complication of cystostomy catheter, initial encounter: Secondary | ICD-10-CM | POA: Diagnosis not present

## 2022-06-26 DIAGNOSIS — E872 Acidosis, unspecified: Secondary | ICD-10-CM | POA: Diagnosis not present

## 2022-06-26 DIAGNOSIS — T380X5A Adverse effect of glucocorticoids and synthetic analogues, initial encounter: Secondary | ICD-10-CM | POA: Diagnosis present

## 2022-06-26 DIAGNOSIS — N3289 Other specified disorders of bladder: Secondary | ICD-10-CM | POA: Diagnosis not present

## 2022-06-26 DIAGNOSIS — N390 Urinary tract infection, site not specified: Secondary | ICD-10-CM

## 2022-06-26 DIAGNOSIS — Y846 Urinary catheterization as the cause of abnormal reaction of the patient, or of later complication, without mention of misadventure at the time of the procedure: Secondary | ICD-10-CM | POA: Diagnosis present

## 2022-06-26 DIAGNOSIS — Z7189 Other specified counseling: Secondary | ICD-10-CM

## 2022-06-26 DIAGNOSIS — Z515 Encounter for palliative care: Secondary | ICD-10-CM

## 2022-06-26 DIAGNOSIS — Z90710 Acquired absence of both cervix and uterus: Secondary | ICD-10-CM | POA: Diagnosis not present

## 2022-06-26 DIAGNOSIS — N200 Calculus of kidney: Secondary | ICD-10-CM | POA: Diagnosis not present

## 2022-06-26 DIAGNOSIS — Z7952 Long term (current) use of systemic steroids: Secondary | ICD-10-CM | POA: Diagnosis not present

## 2022-06-26 DIAGNOSIS — T83510A Infection and inflammatory reaction due to cystostomy catheter, initial encounter: Secondary | ICD-10-CM | POA: Diagnosis not present

## 2022-06-26 DIAGNOSIS — Z66 Do not resuscitate: Secondary | ICD-10-CM | POA: Diagnosis present

## 2022-06-26 DIAGNOSIS — R339 Retention of urine, unspecified: Secondary | ICD-10-CM | POA: Diagnosis present

## 2022-06-26 DIAGNOSIS — T83098A Other mechanical complication of other indwelling urethral catheter, initial encounter: Secondary | ICD-10-CM | POA: Diagnosis not present

## 2022-06-26 DIAGNOSIS — E876 Hypokalemia: Secondary | ICD-10-CM | POA: Diagnosis present

## 2022-06-26 LAB — BASIC METABOLIC PANEL
Anion gap: 15 (ref 5–15)
BUN: 24 mg/dL — ABNORMAL HIGH (ref 8–23)
CO2: 25 mmol/L (ref 22–32)
Calcium: 9 mg/dL (ref 8.9–10.3)
Chloride: 101 mmol/L (ref 98–111)
Creatinine, Ser: 0.79 mg/dL (ref 0.44–1.00)
GFR, Estimated: >60 mL/min (ref >60)
Glucose, Bld: 112 mg/dL — ABNORMAL HIGH (ref 70–99)
Potassium: 4.6 mmol/L (ref 3.5–5.1)
Sodium: 141 mmol/L (ref 135–145)

## 2022-06-26 LAB — CBC WITH DIFFERENTIAL/PLATELET
Abs Immature Granulocytes: 0.17 10*3/uL — ABNORMAL HIGH (ref 0.00–0.07)
Basophils Absolute: 0.1 10*3/uL (ref 0.0–0.1)
Basophils Relative: 1 %
Eosinophils Absolute: 0.4 10*3/uL (ref 0.0–0.5)
Eosinophils Relative: 2 %
HCT: 37.9 % (ref 36.0–46.0)
Hemoglobin: 11.6 g/dL — ABNORMAL LOW (ref 12.0–15.0)
Immature Granulocytes: 1 %
Lymphocytes Relative: 33 %
Lymphs Abs: 6.1 10*3/uL — ABNORMAL HIGH (ref 0.7–4.0)
MCH: 32.4 pg (ref 26.0–34.0)
MCHC: 30.6 g/dL (ref 30.0–36.0)
MCV: 105.9 fL — ABNORMAL HIGH (ref 80.0–100.0)
Monocytes Absolute: 1.3 10*3/uL — ABNORMAL HIGH (ref 0.1–1.0)
Monocytes Relative: 7 %
Neutro Abs: 10.7 10*3/uL — ABNORMAL HIGH (ref 1.7–7.7)
Neutrophils Relative %: 56 %
Platelets: 310 10*3/uL (ref 150–400)
RBC: 3.58 MIL/uL — ABNORMAL LOW (ref 3.87–5.11)
RDW: 13.9 % (ref 11.5–15.5)
WBC: 18.7 10*3/uL — ABNORMAL HIGH (ref 4.0–10.5)
nRBC: 0 % (ref 0.0–0.2)

## 2022-06-26 LAB — MRSA NEXT GEN BY PCR, NASAL: MRSA by PCR Next Gen: NOT DETECTED

## 2022-06-26 LAB — TYPE AND SCREEN
ABO/RH(D): O POS
Antibody Screen: POSITIVE

## 2022-06-26 LAB — LACTIC ACID, PLASMA
Lactic Acid, Venous: 2.5 mmol/L (ref 0.5–1.9)
Lactic Acid, Venous: 3.9 mmol/L (ref 0.5–1.9)
Lactic Acid, Venous: 5.8 mmol/L (ref 0.5–1.9)

## 2022-06-26 LAB — URINALYSIS, MICROSCOPIC (REFLEX)
RBC / HPF: >50 RBC/hpf (ref 0–5)
Squamous Epithelial / HPF: NONE SEEN (ref 0–5)

## 2022-06-26 LAB — URINALYSIS, ROUTINE W REFLEX MICROSCOPIC

## 2022-06-26 LAB — CBC
HCT: 45.2 % (ref 36.0–46.0)
Hemoglobin: 14 g/dL (ref 12.0–15.0)
MCH: 32.3 pg (ref 26.0–34.0)
MCHC: 31 g/dL (ref 30.0–36.0)
MCV: 104.1 fL — ABNORMAL HIGH (ref 80.0–100.0)
Platelets: 303 10*3/uL (ref 150–400)
RBC: 4.34 MIL/uL (ref 3.87–5.11)
RDW: 13.9 % (ref 11.5–15.5)
WBC: 15 10*3/uL — ABNORMAL HIGH (ref 4.0–10.5)
nRBC: 0 % (ref 0.0–0.2)

## 2022-06-26 LAB — PREPARE RBC (CROSSMATCH)

## 2022-06-26 LAB — PROTIME-INR
INR: 1.5 — ABNORMAL HIGH (ref 0.8–1.2)
Prothrombin Time: 17.7 seconds — ABNORMAL HIGH (ref 11.4–15.2)

## 2022-06-26 LAB — HEMOGLOBIN AND HEMATOCRIT, BLOOD
HCT: 37.1 % (ref 36.0–46.0)
HCT: 30 % — ABNORMAL LOW (ref 36.0–46.0)
Hemoglobin: 11.8 g/dL — ABNORMAL LOW (ref 12.0–15.0)
Hemoglobin: 9.5 g/dL — ABNORMAL LOW (ref 12.0–15.0)

## 2022-06-26 MED ORDER — DIPHENHYDRAMINE HCL 50 MG/ML IJ SOLN
12.5000 mg | Freq: Four times a day (QID) | INTRAMUSCULAR | Status: DC | PRN
  Administered 2022-06-28: 12.5 mg via INTRAVENOUS
  Filled 2022-06-26: qty 1

## 2022-06-26 MED ORDER — HYDROCODONE-ACETAMINOPHEN 5-325 MG PO TABS
1.0000 | ORAL_TABLET | Freq: Four times a day (QID) | ORAL | Status: DC | PRN
  Administered 2022-06-26 – 2022-07-02 (×7): 1 via ORAL
  Filled 2022-06-26 (×8): qty 1

## 2022-06-26 MED ORDER — SODIUM CHLORIDE 0.9 % IV BOLUS
500.0000 mL | Freq: Once | INTRAVENOUS | Status: AC
  Administered 2022-06-26: 500 mL via INTRAVENOUS

## 2022-06-26 MED ORDER — MIDODRINE HCL 5 MG PO TABS
2.5000 mg | ORAL_TABLET | Freq: Once | ORAL | Status: AC
  Administered 2022-06-26: 2.5 mg via ORAL
  Filled 2022-06-26: qty 1

## 2022-06-26 MED ORDER — PROTHROMBIN COMPLEX CONC HUMAN 500 UNITS IV KIT
3637.0000 [IU] | PACK | Status: AC
  Administered 2022-06-26: 3637 [IU] via INTRAVENOUS
  Filled 2022-06-26: qty 3637

## 2022-06-26 MED ORDER — ACETAMINOPHEN 650 MG RE SUPP: 650.0000 mg | Freq: Four times a day (QID) | RECTAL | Status: DC | PRN

## 2022-06-26 MED ORDER — SODIUM CHLORIDE 0.9 % IV SOLN
10.0000 mL/h | Freq: Once | INTRAVENOUS | Status: DC
  Administered 2022-06-29: 10 mL/h via INTRAVENOUS

## 2022-06-26 MED ORDER — HYDROCORTISONE SOD SUC (PF) 100 MG IJ SOLR
100.0000 mg | Freq: Three times a day (TID) | INTRAMUSCULAR | Status: DC
  Administered 2022-06-26 – 2022-06-27 (×4): 100 mg via INTRAVENOUS
  Filled 2022-06-26 (×4): qty 2

## 2022-06-26 MED ORDER — SODIUM CHLORIDE 0.9 % IV SOLN: INTRAVENOUS | Status: DC

## 2022-06-26 MED ORDER — SODIUM CHLORIDE 0.9 % IV SOLN
1.0000 g | Freq: Three times a day (TID) | INTRAVENOUS | Status: DC
  Administered 2022-06-26 – 2022-06-27 (×4): 1 g via INTRAVENOUS
  Filled 2022-06-26 (×6): qty 20

## 2022-06-26 MED ORDER — SODIUM CHLORIDE 0.9% FLUSH
3.0000 mL | Freq: Two times a day (BID) | INTRAVENOUS | Status: DC
  Administered 2022-06-26 – 2022-07-03 (×9): 3 mL via INTRAVENOUS

## 2022-06-26 MED ORDER — CHLORHEXIDINE GLUCONATE CLOTH 2 % EX PADS
6.0000 | MEDICATED_PAD | Freq: Every day | CUTANEOUS | Status: DC
  Administered 2022-06-26 – 2022-07-03 (×8): 6 via TOPICAL

## 2022-06-26 MED ORDER — ALBUTEROL SULFATE (2.5 MG/3ML) 0.083% IN NEBU: 2.5000 mg | INHALATION_SOLUTION | Freq: Four times a day (QID) | RESPIRATORY_TRACT | Status: DC | PRN

## 2022-06-26 MED ORDER — ACETAMINOPHEN 325 MG PO TABS
650.0000 mg | ORAL_TABLET | Freq: Four times a day (QID) | ORAL | Status: DC | PRN
  Filled 2022-06-26: qty 2

## 2022-06-26 MED ORDER — SODIUM CHLORIDE 0.9 % IV SOLN
1.0000 g | Freq: Once | INTRAVENOUS | Status: AC
  Administered 2022-06-26: 1 g via INTRAVENOUS
  Filled 2022-06-26: qty 10

## 2022-06-26 NOTE — ED Notes (Signed)
Foley catheter clotted. Irrigation had to be paused for CT scan earlier. Tried to irrigate it w/ a syringe and was able to get 1 blood clot out. Unable to get good flow. Notified Tamala Julian MD. Tamala Julian MD spoke w/ urology who said to stop CBI at this time. CBI stopped.

## 2022-06-26 NOTE — ED Notes (Signed)
EDT at bedside attempting 1st blood culture collection

## 2022-06-26 NOTE — ED Notes (Signed)
Urology MD at bedside.  RN assisted with catheter placement.  20 Fr 3-way catheter was placed by Urology MD.  Irrigation preformed.  Multiple clots irrigated from catheter by Urology MD.

## 2022-06-26 NOTE — ED Notes (Signed)
Pt catheter clotted up again. Difficulty irrigating. Tamala Julian MD notified. Bell MD consulted. Both catheters hooked to drainage bags. Urethral catheter is not flowing and Mds are aware. SPC is flowing. Per Gloriann Loan MD, stop CBI at this time.

## 2022-06-26 NOTE — ED Provider Notes (Signed)
Signed out by Dr. Sedonia Small Patient with neurogenic bladder and suprapubic catheter on eliquis for ho dvt with ongoing hematuria Per urology ongoing bladder irrigation Dr. Sedonia Small with Dr. Cain Sieve, on call for urology who will be in to see and assist with dispo Awaiting UA CT abdomen Urology consult Discussed with Dr. Jenne Campus who has seen patient and placed foley Bladder irrigation ongoing and continues  Last eliquis at 10-11 pm loc Dr. Jenne Campus would like patient at [place] [facility] admit Patient with episode of hypotension post irrigation Last hgb at 0443 14 Fluid bolus 500 cc now Patient now on second liter Repeat hgb 11 Plan 1 unit prbc and reversal with Eppie Gibson  Discussed with Esmeralda Arthur, [facility] and she will dose Eppie Gibson Discussed with Dr. Jenne Campus, 9:13 AM Plan rocephin pending ua results CT will need to be done, but not necessarily before admission He is aware of relative hypotensive and above treatments Plan discussion with hospitalist team at this time       Pattricia Boss, MD 06/27/22 1557

## 2022-06-26 NOTE — Consult Note (Signed)
Urology Consult   Physician requesting consult: Bero MD  Reason for consult: gross hematuria  History of Present Illness: Katelyn Lamb is a 76 y.o. who presented to the ED earlier this AM with gross hematuria. Per her husband, this started overnight - she has had intermittent hematuria previously but this was noticeably worse than normal.   Katelyn Lamb was last seen in our clinic in 2020 for urinary retention. Her bladder is typically managed with an SP tube, which is changed monthly by home health. It was last changed on Wednesday. Katelyn Lamb takes eliquis for a history of DVT.  After I saw the patient in the ED, her blood pressures decreased.   06/26/2022: H/H 14/45.2, WBC 15, Cr 0.79 -> rechecked at 820 and Hgb was 11.6. Her White count increased to 18.7  CT scan performed in the ED shows there is likely a small/med amount blood or clot in the bladder; SP tube and foley balloons present in the bladder.   Past Medical History:  Diagnosis Date   Acute deep vein thrombosis (DVT) of popliteal vein of left lower extremity (HCC)    Anxiety    Back pain    Chronic kidney disease    Encephalomyelitis    Gait abnormality 11/14/2016   Memory difficulty 03/02/2019   Myelitis due to herpes simplex [facility])    Neurogenic bladder     Past Surgical History:  Procedure Laterality Date   ABDOMINAL HYSTERECTOMY     BLADDER REPAIR     CESAREAN SECTION     IR ANGIO EXTERNAL CAROTID SEL EXT CAROTID BILAT MOD SED  07/18/2021   IR ANGIO INTRA EXTRACRAN SEL INTERNAL CAROTID BILAT MOD SED  07/18/2021   IR KYPHO LUMBAR INC FX REDUCE BONE BX UNI/BIL CANNULATION INC/IMAGING  04/11/2018   IR NEURO EACH ADD'L AFTER BASIC UNI LEFT (Katelyn)  07/18/2021   IR NEURO EACH ADD'L AFTER BASIC UNI RIGHT (Katelyn)  07/18/2021   IR TRANSCATH/EMBOLIZ  07/18/2021   IR US GUIDE VASC ACCESS RIGHT  07/18/2021   RADIOLOGY WITH ANESTHESIA N/A 07/18/2021   Procedure: IR WITH ANESTHESIA;  Surgeon: Pedro Earls, MD;   Location: [place];  Service: Radiology;  Laterality: N/A;   TUBAL LIGATION       Current Hospital Medications:  Home meds:  No current facility-administered medications on file prior to encounter.   Current Outpatient Medications on File Prior to Encounter  Medication Sig Dispense Refill   acyclovir (ZOVIRAX) 400 MG tablet TAKE 1 TABLET BY MOUTH 2 TIMES DAILY. PLEASE CALL AND MAKE OVERDUE APPT FOR FURTHER REFILLS 60 tablet 0   albuterol (PROVENTIL) (2.5 MG/3ML) 0.083% nebulizer solution Take 3 mLs (2.5 mg total) by nebulization every 2 (two) hours as needed for wheezing or shortness of breath. 75 mL 12   apixaban (ELIQUIS) 5 MG TABS tablet Take 1 tablet (5 mg total) by mouth 2 (two) times daily. 60 tablet    Ascorbic Acid (VITAMIN C) 500 MG CHEW Chew 500 mg by mouth daily.     Calcium Carb-Cholecalciferol (CALCIUM 600 + D PO) Take 1 tablet by mouth in the morning and at bedtime.     Cyanocobalamin (VITAMIN B-12 PO) Take 500 mcg by mouth daily.     diazepam (VALIUM) 5 MG tablet TAKE 1 TABLET BY MOUTH EVERY 12 HOURS AS NEEDED FOR TREMORS 180 tablet 0   docusate sodium (COLACE) 100 MG capsule Take 200 mg by mouth daily as needed for mild constipation.  DULoxetine (CYMBALTA) 60 MG capsule TAKE 1 CAPSULE (60 MG TOTAL) BY MOUTH AT BEDTIME. 90 capsule 0   feeding supplement (ENSURE ENLIVE / ENSURE PLUS) LIQD Take 237 mLs by mouth 2 (two) times daily between meals. 237 mL 12   ferrous sulfate 325 (65 FE) MG tablet Take 325 mg by mouth every morning.     guaiFENesin (MUCINEX) 600 MG 12 hr tablet Take 2 tablets (1,200 mg total) by mouth 2 (two) times daily.     HYDROcodone-acetaminophen (NORCO) 10-325 MG tablet Take 1 tablet by mouth 3 (three) times daily as needed.     loperamide (IMODIUM) 2 MG capsule Take 2 mg by mouth daily as needed for diarrhea or loose stools.     midodrine (PROAMATINE) 5 MG tablet Take 1 tablet (5 mg total) by mouth 3 (three) times daily with meals. 90 tablet 1   mirabegron  ER (MYRBETRIQ) 50 MG TB24 tablet Take 50 mg by mouth at bedtime.     omega-3 acid ethyl esters (LOVAZA) 1 g capsule Take 1 g by mouth every morning.     pantoprazole (PROTONIX) 40 MG tablet Take 1 tablet (40 mg total) by mouth daily. 30 tablet 1   predniSONE (DELTASONE) 5 MG tablet Take 1 tablet (5 mg total) by mouth daily with breakfast. 90 tablet 2   pregabalin (LYRICA) 50 MG capsule Take 1 capsule (50 mg total) by mouth 2 (two) times daily. 180 capsule 1   QUEtiapine (SEROQUEL) 50 MG tablet Take 1 tablet (50 mg total) by mouth at bedtime. 90 tablet 1   sodium chloride (OCEAN) 0.65 % SOLN nasal spray Place 1 spray into both nostrils as needed for congestion. 30 mL 0     Scheduled Meds: Continuous Infusions: PRN Meds:.  Allergies:  Allergies  Allergen Reactions   Demerol [Meperidine] Other (See Comments)    Hallucinations   Percocet [Oxycodone-Acetaminophen] Itching   Amoxicillin-Pot Clavulanate Diarrhea    Severe pain, headache, intestinal infection   Penicillins Itching and Rash    Tolerated amoxicillin November 2022  Has patient had a PCN reaction causing immediate rash, facial/tongue/throat swelling, SOB or lightheadedness with hypotension:  NO Has patient had a PCN reaction causing severe rash involving mucus membranes or skin necrosis: No Has patient had a PCN reaction that required hospitalization: No Has patient had a PCN reaction occurring within the last 10 years: Yes If all of the above answers are "NO", then may proceed with Cephalosporin use.    Family History  Problem Relation Age of Onset   Hypertension Mother     Social History:  reports that she has quit smoking. Her smoking use included cigarettes. She has a 40.00 pack-year smoking history. She has never used smokeless tobacco. She reports that she does not drink alcohol and does not use drugs.  ROS: A complete review of systems was performed.  All systems are negative except for pertinent findings as  noted.  Physical Exam:  Vital signs in last 24 hours: Temp:  [97.7 F (36.5 C)-98.8 F (37.1 C)] 97.7 F (36.5 C) (10/24 0826) Pulse Rate:  [72-125] 111 (10/24 0935) Resp:  [19-40] 27 (10/24 0935) BP: (66-129)/(30-91) 78/64 (10/24 0935) SpO2:  [92 %-99 %] 97 % (10/24 0935) Weight:  [68 kg] 68 kg (10/24 0353) Constitutional:  Alert and oriented, No acute distress Cardiovascular: Regular rate and rhythm Respiratory: Normal respiratory effort, Lungs clear bilaterally GI: Abdomen is soft, nontender, nondistended, no abdominal masses GU: No CVA tenderness; SP tube in place  draining kool-aid colored urine Neurologic: Grossly intact, no focal deficits Psychiatric: Normal mood and affect  Laboratory Data:  Recent Labs    06/26/22 0443 06/26/22 0820  WBC 15.0* 18.7*  HGB 14.0 11.6*  HCT 45.2 37.9  PLT 303 310    Recent Labs    06/26/22 0443  NA 141  K 4.6  CL 101  GLUCOSE 112*  BUN 24*  CALCIUM 9.0  CREATININE 0.79     Results for orders placed or performed during the hospital encounter of 06/26/22 (from the past 24 hour(s))  CBC     Status: Abnormal   Collection Time: 06/26/22  4:43 AM  Result Value Ref Range   WBC 15.0 (H) 4.0 - 10.5 K/uL   RBC 4.34 3.87 - 5.11 MIL/uL   Hemoglobin 14.0 12.0 - 15.0 g/dL   HCT 45.2 36.0 - 46.0 %   MCV 104.1 (H) 80.0 - 100.0 fL   MCH 32.3 26.0 - 34.0 pg   MCHC 31.0 30.0 - 36.0 g/dL   RDW 13.9 11.5 - 15.5 %   Platelets 303 150 - 400 K/uL   nRBC 0.0 0.0 - 0.2 %  Basic metabolic panel     Status: Abnormal   Collection Time: 06/26/22  4:43 AM  Result Value Ref Range   Sodium 141 135 - 145 mmol/L   Potassium 4.6 3.5 - 5.1 mmol/L   Chloride 101 98 - 111 mmol/L   CO2 25 22 - 32 mmol/L   Glucose, Bld 112 (H) 70 - 99 mg/dL   BUN 24 (H) 8 - 23 mg/dL   Creatinine, Ser 0.79 0.44 - 1.00 mg/dL   Calcium 9.0 8.9 - 10.3 mg/dL   GFR, Estimated >60 >60 mL/min   Anion gap 15 5 - 15  Type and screen [place]     Status:  None (Preliminary result)   Collection Time: 06/26/22  8:07 AM  Result Value Ref Range   ABO/RH(D) O POS    Antibody Screen POS    Sample Expiration 06/29/2022,2359    Unit Number [id number]    Blood Component Type RED CELLS,LR    Unit division 00    Status of Unit ISSUED    Unit tag comment VERBAL ORDERS PER DR DANIELLE RAY    Transfusion Status      OK TO TRANSFUSE Performed at [facility], [address], [place], [place] [address]    Crossmatch Result PENDING   CBC with Differential     Status: Abnormal   Collection Time: 06/26/22  8:20 AM  Result Value Ref Range   WBC 18.7 (H) 4.0 - 10.5 K/uL   RBC 3.58 (L) 3.87 - 5.11 MIL/uL   Hemoglobin 11.6 (L) 12.0 - 15.0 g/dL   HCT 37.9 36.0 - 46.0 %   MCV 105.9 (H) 80.0 - 100.0 fL   MCH 32.4 26.0 - 34.0 pg   MCHC 30.6 30.0 - 36.0 g/dL   RDW 13.9 11.5 - 15.5 %   Platelets 310 150 - 400 K/uL   nRBC 0.0 0.0 - 0.2 %   Neutrophils Relative % 56 %   Neutro Abs 10.7 (H) 1.7 - 7.7 K/uL   Lymphocytes Relative 33 %   Lymphs Abs 6.1 (H) 0.7 - 4.0 K/uL   Monocytes Relative 7 %   Monocytes Absolute 1.3 (H) 0.1 - 1.0 K/uL   Eosinophils Relative 2 %   Eosinophils Absolute 0.4 0.0 - 0.5 K/uL   Basophils Relative 1 %  Basophils Absolute 0.1 0.0 - 0.1 K/uL   Immature Granulocytes 1 %   Abs Immature Granulocytes 0.17 (H) 0.00 - 0.07 K/uL  Urinalysis, Routine w reflex microscopic Urine, Catheterized     Status: Abnormal   Collection Time: 06/26/22  9:00 AM  Result Value Ref Range   Color, Urine RED (A) YELLOW   APPearance TURBID (A) CLEAR   Specific Gravity, Urine  1.005 - 1.030    TEST NOT REPORTED DUE TO COLOR INTERFERENCE OF URINE PIGMENT   pH  5.0 - 8.0    TEST NOT REPORTED DUE TO COLOR INTERFERENCE OF URINE PIGMENT   Glucose, UA (A) NEGATIVE mg/dL    TEST NOT REPORTED DUE TO COLOR INTERFERENCE OF URINE PIGMENT   Hgb urine dipstick (A) NEGATIVE    TEST NOT REPORTED DUE TO COLOR INTERFERENCE OF URINE PIGMENT   Bilirubin  Urine (A) NEGATIVE    TEST NOT REPORTED DUE TO COLOR INTERFERENCE OF URINE PIGMENT   Ketones, ur (A) NEGATIVE mg/dL    TEST NOT REPORTED DUE TO COLOR INTERFERENCE OF URINE PIGMENT   Protein, ur (A) NEGATIVE mg/dL    TEST NOT REPORTED DUE TO COLOR INTERFERENCE OF URINE PIGMENT   Nitrite (A) NEGATIVE    TEST NOT REPORTED DUE TO COLOR INTERFERENCE OF URINE PIGMENT   Leukocytes,Ua (A) NEGATIVE    TEST NOT REPORTED DUE TO COLOR INTERFERENCE OF URINE PIGMENT  Protime-INR     Status: Abnormal   Collection Time: 06/26/22  9:00 AM  Result Value Ref Range   Prothrombin Time 17.7 (H) 11.4 - 15.2 seconds   INR 1.5 (H) 0.8 - 1.2  Urinalysis, Microscopic (reflex)     Status: Abnormal   Collection Time: 06/26/22  9:00 AM  Result Value Ref Range   RBC / HPF >50 0 - 5 RBC/hpf   WBC, UA 0-5 0 - 5 WBC/hpf   Bacteria, UA MANY (A) NONE SEEN   Squamous Epithelial / LPF NONE SEEN 0 - 5   WBC Clumps PRESENT    No results found for this or any previous visit (from the past 240 hour(s)).  Renal Function: Recent Labs    06/26/22 0443  CREATININE 0.79   Estimated Creatinine Clearance: 57.8 mL/min (by C-G formula based on SCr of 0.79 mg/dL).  Radiologic Imaging: CT ABDOMEN PELVIS WO CONTRAST  Result Date: 06/26/2022 CLINICAL DATA:  Gross hematuria, has suprapubic catheter, had red streaks and collection bag yesterday, today frank red urine, on Eliquis, golf ball sized clots EXAM: CT ABDOMEN AND PELVIS WITHOUT CONTRAST TECHNIQUE: Multidetector CT imaging of the abdomen and pelvis was performed following the standard protocol without IV contrast. RADIATION DOSE REDUCTION: This exam was performed according to the departmental dose-optimization program which includes automated exposure control, adjustment of the mA and/or kV according to patient size and/or use of iterative reconstruction technique. COMPARISON:  08/07/2021 FINDINGS: Lower chest: Lung bases clear Hepatobiliary: Gallbladder and liver normal  appearance. No biliary dilatation. Pancreas: Normal appearance Spleen: Normal appearance Adrenals/Urinary Tract: Adrenal glands normal appearance. Tiny nonobstructing calculus at inferior pole LEFT kidney. Kidneys otherwise normal appearance without mass or hydronephrosis. Slightly increased attenuation of the decompressed RIGHT renal pelvis and proximal RIGHT ureter, cannot exclude blood. LEFT ureter is low-attenuation. Foley catheter and suprapubic catheter balloons within urinary bladder. Significant bladder wall thickening. High attenuation material within bladder lumen consistent with blood. Minimal air within bladder lumen. No focal bladder mass identified though a bladder mass could be obscured by the blood and catheters.  Stomach/Bowel: Minimal distal colonic diverticulosis without evidence of diverticulitis. Stool RIGHT colon. Appendix normal caliber with high attenuation material versus appendicolith in distal lumen. Stomach and remaining bowel loops normal appearance. Vascular/Lymphatic: Atherosclerotic calcifications aorta and iliac arteries without aneurysm Reproductive: Uterus surgically absent. Nonvisualization of ovaries. Other: Tiny umbilical hernia containing fat. No free air or free fluid. No inflammatory process. Musculoskeletal: Osseous demineralization. Old inferior LEFT pubic ramus fracture deformity. Prior L5 and L2 spinal augmentation procedures. Superior endplate height losses of multiple thoracic and lumbar vertebra. IMPRESSION: Significant bladder wall thickening with high attenuation material within bladder lumen consistent with blood; no definite bladder mass identified though a bladder mass could be obscured by the blood and catheters. Slightly increased attenuation of the decompressed RIGHT renal pelvis and proximal RIGHT ureter, question hematuria. Tiny nonobstructing LEFT renal calculus. Minimal distal colonic diverticulosis without evidence of diverticulitis. Tiny umbilical hernia  containing fat. Aortic Atherosclerosis (ICD10-I70.0). Electronically Signed   By: Lavonia Dana M.D.   On: 06/26/2022 09:41    I independently reviewed the above imaging studies.  Procedure: The patient was prepped and draped in the usual fashion. I carefully inserted a 81f 3 way catheter into the bladder. The balloon was inflated with 20 cc sterile water. I then hand irrigated out a moderate amount of clot through the catheter before connecting it to the appropriate collection bag and starting CBI, which was draining light pink on med rate CBI.  Impression/Recommendation: 76yo F with gross hematuria, clot retention. Patient now with capped SP tube (3-way) and 3-way foley in her bladder with CBI. I hand irrigated out a moderate amount of clots  -CT shows some residual blood vs clots in the bladder. Will continue CBI for time being -hold eliquis; I believe the ED is planning to administer the reversal agent -1 unit PRBCs to be transfused in ED -f/u UA/Ucx; rocephin started empirically -following  LDonald Pore10/24/2023, 10:00 AM  [facility]  Pager: [phone]

## 2022-06-26 NOTE — ED Notes (Signed)
Attempted to flush foley catheter.  Large blood clots noted.  Unable to flush catheter at this time.  MD aware and Urology cart being brought to bedside

## 2022-06-26 NOTE — Progress Notes (Signed)
Bell MD with Urology paged and notified that patient is at [facility] ICU 1240

## 2022-06-26 NOTE — ED Notes (Signed)
Pt transported to CT at this time on cardiac monitor by RN

## 2022-06-26 NOTE — ED Notes (Signed)
Pt still hypotensive. Notified Ray MD, verbal order obtained for repeat CBC and 515m NS bolus.

## 2022-06-26 NOTE — ED Notes (Signed)
Katelyn Lamb, phlebotomy obtained pink top and H&H and is sending it to lab and blood bank. Notified blood bank.

## 2022-06-26 NOTE — ED Notes (Signed)
Pt leaving [facility] ED at this time via [facility].

## 2022-06-26 NOTE — ED Notes (Signed)
Patient c/o feeling "hot."  Patient became pale and diaphoretic.  BP obtained.  BP currently 66/30.  ED MD called to bedside.

## 2022-06-26 NOTE — ED Notes (Addendum)
Blood bank called and said to stop the blood transfusion d/t incompatibility. Notified Smith MD who is at bedside. Blood transfusion paused. Another pink top to be drawn for testing. Pt has received approximately 247m of blood.

## 2022-06-26 NOTE — ED Provider Notes (Signed)
[facility] Emergency Department Provider Note MRN:  [id number]  Arrival date & time: 06/26/22     Chief Complaint   Hematuria   History of Present Illness   Katelyn Lamb is a 76 y.o. year-old female with a history of DVT, CKD, neurogenic bladder presenting to the ED with chief complaint of hematuria.  Hematuria this evening, lots of blood and clots exiting the suprapubic catheter as well as around the catheter.  Denies abdominal pain, no fever, some burning with urination.  No other complaints.  Review of Systems  A thorough review of systems was obtained and all systems are negative except as noted in the HPI and PMH.   Patient's Health History    Past Medical History:  Diagnosis Date   Acute deep vein thrombosis (DVT) of popliteal vein of left lower extremity (HCC)    Anxiety    Back pain    Chronic kidney disease    Encephalomyelitis    Gait abnormality 11/14/2016   Memory difficulty 03/02/2019   Myelitis due to herpes simplex ([place])    Neurogenic bladder     Past Surgical History:  Procedure Laterality Date   ABDOMINAL HYSTERECTOMY     BLADDER REPAIR     CESAREAN SECTION     IR ANGIO EXTERNAL CAROTID SEL EXT CAROTID BILAT MOD SED  07/18/2021   IR ANGIO INTRA EXTRACRAN SEL INTERNAL CAROTID BILAT MOD SED  07/18/2021   IR KYPHO LUMBAR INC FX REDUCE BONE BX UNI/BIL CANNULATION INC/IMAGING  04/11/2018   IR NEURO EACH ADD'L AFTER BASIC UNI LEFT (MS)  07/18/2021   IR NEURO EACH ADD'L AFTER BASIC UNI RIGHT (MS)  07/18/2021   IR TRANSCATH/EMBOLIZ  07/18/2021   IR US GUIDE VASC ACCESS RIGHT  07/18/2021   RADIOLOGY WITH ANESTHESIA N/A 07/18/2021   Procedure: IR WITH ANESTHESIA;  Surgeon: Pedro Earls, MD;  Location: [place];  Service: Radiology;  Laterality: N/A;   TUBAL LIGATION      Family History  Problem Relation Age of Onset   Hypertension Mother     Social History   Socioeconomic History   Marital status: Married    Spouse  name: [name]   Number of children: 2   Years of education: 12   Highest education level: Not on file  Occupational History   Occupation: retired  Tobacco Use   Smoking status: Former    Packs/day: 2.00    Years: 20.00    Total pack years: 40.00    Types: Cigarettes   Smokeless tobacco: Never   Tobacco comments:    40 years ago   Vaping Use   Vaping Use: Never used  Substance and Sexual Activity   Alcohol use: No   Drug use: Never   Sexual activity: Not Currently  Other Topics Concern   Not on file  Social History Narrative   Lives with husband   Caffeine 2-3 cups daily   Right-handed   Social Determinants of Health   Financial Resource Strain: Not on file  Food Insecurity: Not on file  Transportation Needs: Not on file  Physical Activity: Not on file  Stress: Not on file  Social Connections: Not on file  Intimate Partner Violence: Not on file     Physical Exam   Vitals:   06/26/22 0404 06/26/22 0415  BP: 129/74 (!) 115/91  Pulse:  (!) 106  Resp:  (!) 28  Temp:    SpO2:  92%    CONSTITUTIONAL: Well-appearing,  NAD NEURO/PSYCH:  Alert and oriented x 3, no focal deficits EYES:  eyes equal and reactive ENT/NECK:  no LAD, no JVD CARDIO: Regular rate, well-perfused, normal S1 and S2 PULM:  CTAB no wheezing or rhonchi GI/GU:  non-distended, non-tender MSK/SPINE:  No gross deformities, no edema SKIN:  no rash, atraumatic   *Additional and/or pertinent findings included in MDM below  Diagnostic and Interventional Summary    EKG Interpretation  Date/Time:  Tuesday June 26 2022 04:03:43 EDT Ventricular Rate:  108 PR Interval:  135 QRS Duration: 79 QT Interval:  334 QTC Calculation: 448 R Axis:   214 Text Interpretation: Sinus tachycardia Low voltage with right axis deviation Abnormal R-wave progression, late transition Confirmed by Gerlene Fee [phone]) on 06/26/2022 4:59:51 AM       Labs Reviewed  CBC - Abnormal; Notable for the following  components:      Result Value   WBC 15.0 (*)    MCV 104.1 (*)    All other components within normal limits  BASIC METABOLIC PANEL - Abnormal; Notable for the following components:   Glucose, Bld 112 (*)    BUN 24 (*)    All other components within normal limits  URINALYSIS, ROUTINE W REFLEX MICROSCOPIC    CT ABDOMEN PELVIS WO CONTRAST    (Results Pending)    Medications - No data to display   Procedures  /  Critical Care BLADDER CATHETERIZATION  Date/Time: 06/26/2022 4:58 AM  Performed by: Maudie Flakes, MD Authorized by: Maudie Flakes, MD   Consent:    Consent obtained:  Verbal   Risks discussed:  False passage, incomplete procedure, pain, infection and urethral injury Universal protocol:    Procedure explained and questions answered to patient or proxy's satisfaction: yes     Immediately prior to procedure, a time out was called: yes     Patient identity confirmed:  Verbally with patient Pre-procedure details:    Procedure purpose:  Therapeutic   Preparation: Patient was prepped and draped in usual sterile fashion   Procedure details:    Provider performed due to:  Complicated insertion (suprapubic catheter)   Catheter insertion:  Indwelling   Catheter type:  Foley   Catheter size:  20 Fr   Bladder irrigation: no     Number of attempts:  1   Urine characteristics:  Bloody Post-procedure details:    Procedure completion:  Tolerated well, no immediate complications BLADDER CATHETERIZATION  Date/Time: 06/26/2022 6:36 AM  Performed by: Maudie Flakes, MD Authorized by: Maudie Flakes, MD   Consent:    Consent obtained:  Verbal   Consent given by:  Patient   Risks, benefits, and alternatives were discussed: yes     Risks discussed:  Urethral injury, infection, pain, incomplete procedure and false passage   Alternatives discussed:  No treatment Universal protocol:    Procedure explained and questions answered to patient or proxy's satisfaction: yes      Immediately prior to procedure, a time out was called: yes     Patient identity confirmed:  Verbally with patient Pre-procedure details:    Procedure purpose:  Therapeutic Anesthesia:    Anesthesia method:  None Procedure details:    Provider performed due to:  Complicated insertion (suprapubic)   Catheter insertion:  Indwelling   Catheter type:  Foley   Catheter size:  20 Fr   Bladder irrigation: yes     Number of attempts:  1   Urine characteristics:  Bloody Post-procedure details:  Procedure completion:  Tolerated well, no immediate complications Comments:     Standard foley replaced with 3-way irrigation foley   ED Course and Medical Decision Making  Initial Impression and Ddx Differential diagnosis includes UTI, mild irritation of the bladder wall complicated by anticoagulation, less likely bladder mass, glomerulonephritis, etc.  Awaiting basic labs, urinalysis.  Will flush the catheter to see if this stops the urine/blood flow and leaking around it, suspect obstruction.  Past medical/surgical history that increases complexity of ED encounter: Neurogenic bladder, anticoagulated  Interpretation of Diagnostics I personally reviewed the laboratory assessment and my interpretation is as follows: No significant blood count or electrolyte disturbance  Urinalysis and CT pending  Patient Reassessment and Ultimate Disposition/Management     Patient to be evaluated by Dr. Cain Sieve of urology, signed out to oncoming provider.  Patient management required discussion with the following services or consulting groups:  Urology  Complexity of Problems Addressed Acute illness or injury that poses threat of life of bodily function  Additional Data Reviewed and Analyzed Further history obtained from: Further history from spouse/family member  Additional Factors Impacting ED Encounter Risk Minor Procedures and Consideration of hospitalization  Barth Kirks. Sedonia Small, MD [place] [email or web address]  Final Clinical Impressions(s) / ED Diagnoses     ICD-10-CM   1. Hematuria, unspecified type  R31.9     2. Obstruction of suprapubic catheter, initial encounter [facility])  T83.090A       ED Discharge Orders     None        Discharge Instructions Discussed with and Provided to Patient:   Discharge Instructions   None      Maudie Flakes, MD 06/26/22 [id number]

## 2022-06-26 NOTE — ED Notes (Signed)
Urology at bedside.

## 2022-06-26 NOTE — ED Triage Notes (Signed)
BIBA from home. Patient has hx of supra-pubic catheter. Yesterday noticed red streaks in collection bag. Tonight frank red urine in catheter tubing and bag. Patient take Eliquis.  Presents to ED with golf ball sized blood clots and frank red blood in brief.   Ems VS:  106/64 98HR 97% RA CBG= 138

## 2022-06-26 NOTE — ED Notes (Signed)
MD at bedside. 

## 2022-06-26 NOTE — ED Notes (Signed)
Phlebotomy at bedside to attempt blood draw for extra pink top

## 2022-06-26 NOTE — ED Notes (Signed)
Pt MAP dropped to 50's while in CT. Verbal order for another 558m NS bolus obtained and started.

## 2022-06-26 NOTE — Progress Notes (Signed)
Pharmacy Antibiotic Note  Katelyn Lamb is a 76 y.o. female admitted on 06/26/2022 with  infection concern in the setting of ESBL history . Pharmacy has been consulted for meropenem dosing. Patient grew out ESBL Kleb pneumo in 07/2021 from a urine culture. Patient has been afebrile, WBC elevatd at 18.7, patient with lactic acidosis. Concern for hemorrhagic shock in addition to septic.   Plan: Meropenem 1g Q8H  Follow culture data for de-escalation.  Monitor renal function for dose adjustments as indicated.    Height: '5\' 6"'$  (167.6 cm) Weight: 68 kg (150 lb) IBW/kg (Calculated) : 59.3  Temp (24hrs), Avg:98.2 F (36.8 C), Min:97.5 F (36.4 C), Max:98.8 F (37.1 C)  Recent Labs  Lab 06/26/22 0443 06/26/22 0820 06/26/22 0900 06/26/22 1101  WBC 15.0* 18.7*  --   --   CREATININE 0.79  --   --   --   LATICACIDVEN  --   --  2.5* 3.9*    Estimated Creatinine Clearance: 57.8 mL/min (by C-G formula based on SCr of 0.79 mg/dL).    Allergies  Allergen Reactions   Demerol [Meperidine] Other (See Comments)    Hallucinations   Hydrocodone-Acetaminophen Other (See Comments)   Percocet [Oxycodone-Acetaminophen] Itching   Amoxicillin-Pot Clavulanate Diarrhea    Severe pain, headache, intestinal infection   Penicillins Itching and Rash    Tolerated amoxicillin November 2022  Has patient had a PCN reaction causing immediate rash, facial/tongue/throat swelling, SOB or lightheadedness with hypotension:  NO Has patient had a PCN reaction causing severe rash involving mucus membranes or skin necrosis: No Has patient had a PCN reaction that required hospitalization: No Has patient had a PCN reaction occurring within the last 10 years: Yes If all of the above answers are "NO", then may proceed with Cephalosporin use.     Thank you for allowing pharmacy to be a part of this patient's care.  Ventura Sellers 06/26/2022 12:29 PM

## 2022-06-26 NOTE — H&P (Addendum)
History and Physical    Patient: Katelyn Lamb ZOX:[id number] DOB: 12-22-1945 DOA: 06/26/2022 DOS: the patient was seen and examined on 06/26/2022 PCP: [name], MD  Patient coming from: Home via EMS  Chief Complaint:  Chief Complaint  Patient presents with   Hematuria   HPI: Katelyn Lamb is a 76 y.o. 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  History is somewhat limited from patient due to her being in pain at this time.  Patient reports associated symptoms of burning suprapubic pain.  She has had intermittent hematuria in the past, but never this severe.  Records note patient was hospitalized in 07/2021 due to epistaxis from the left nare in the setting of anticoagulation with Xarelto requiring embolization of the left sphenopalatine artery with IR. Patient with prior history of DVT in 2019, 2021 with PE on Xarelto, and most recently 08/2021  right lower extremity DVT with bilateral pulmonary embolism switched to Eliquis.  Upon admission into the emergency department patient was noted to be afebrile, tachycardic, and tachypneic with blood pressures charted dropping to as low as 66/30 with improvement after initial IV fluid boluses.  Labs significant for WBC 18.7, hemoglobin 14-> 11.6, BUN 24, and creatinine 0.79.  Blood and urine cultures have been obtained.  Patient received antibiotics of Rocephin, Kcentra, and a total of 1 L of IV fluids.  She had been typed and screened and ordered 1 unit of packed red blood cells.  Urology had been consulted and placed Foley catheter and recommended continuous bladder irrigation.  Review of Systems: Unable to review all systems due to lack of cooperation from patient. Past Medical History:   Diagnosis Date   Acute deep vein thrombosis (DVT) of popliteal vein of left lower extremity (HCC)    Anxiety    Back pain    Chronic kidney disease    Encephalomyelitis    Gait abnormality 11/14/2016   Memory difficulty 03/02/2019   Myelitis due to herpes simplex [facility])    Neurogenic bladder    Past Surgical History:  Procedure Laterality Date   ABDOMINAL HYSTERECTOMY     BLADDER REPAIR     CESAREAN SECTION     IR ANGIO EXTERNAL CAROTID SEL EXT CAROTID BILAT MOD SED  07/18/2021   IR ANGIO INTRA EXTRACRAN SEL INTERNAL CAROTID BILAT MOD SED  07/18/2021   IR KYPHO LUMBAR INC FX REDUCE BONE BX UNI/BIL CANNULATION INC/IMAGING  04/11/2018   IR NEURO EACH ADD'L AFTER BASIC UNI LEFT (MS)  07/18/2021   IR NEURO EACH ADD'L AFTER BASIC UNI RIGHT (MS)  07/18/2021   IR TRANSCATH/EMBOLIZ  07/18/2021   IR US GUIDE VASC ACCESS RIGHT  07/18/2021   RADIOLOGY WITH ANESTHESIA N/A 07/18/2021   Procedure: IR WITH ANESTHESIA;  Surgeon: Pedro Earls, MD;  Location: [place];  Service: Radiology;  Laterality: N/A;   TUBAL LIGATION     Social History:  reports that she has quit smoking. Her smoking use included cigarettes. She has a 40.00 pack-year smoking history. She has never used smokeless tobacco. She reports that she does not drink alcohol and does not use drugs.  Allergies  Allergen Reactions   Demerol [Meperidine] Other (See Comments)    Hallucinations   Percocet [Oxycodone-Acetaminophen] Itching   Amoxicillin-Pot Clavulanate Diarrhea    Severe  pain, headache, intestinal infection   Penicillins Itching and Rash    Tolerated amoxicillin November 2022  Has patient had a PCN reaction causing immediate rash, facial/tongue/throat swelling, SOB or lightheadedness with hypotension:  NO Has patient had a PCN reaction causing severe rash involving mucus membranes or skin necrosis: No Has patient had a PCN reaction that required hospitalization: No Has patient had a PCN reaction occurring  within the last 10 years: Yes If all of the above answers are "NO", then may proceed with Cephalosporin use.    Family History  Problem Relation Age of Onset   Hypertension Mother     Prior to Admission medications   Medication Sig Start Date End Date Taking? Authorizing Provider  acyclovir (ZOVIRAX) 400 MG tablet TAKE 1 TABLET BY MOUTH 2 TIMES DAILY. PLEASE CALL AND MAKE OVERDUE APPT FOR FURTHER REFILLS 06/11/22   Dohmeier, Asencion Partridge, MD  albuterol (PROVENTIL) (2.5 MG/3ML) 0.083% nebulizer solution Take 3 mLs (2.5 mg total) by nebulization every 2 (two) hours as needed for wheezing or shortness of breath. 08/15/21   Ghimire, Henreitta Leber, MD  apixaban (ELIQUIS) 5 MG TABS tablet Take 1 tablet (5 mg total) by mouth 2 (two) times daily. 08/15/21   Ghimire, Henreitta Leber, MD  Ascorbic Acid (VITAMIN C) 500 MG CHEW Chew 500 mg by mouth daily.    [provider]  Calcium Carb-Cholecalciferol (CALCIUM 600 + D PO) Take 1 tablet by mouth in the morning and at bedtime.    [provider]  Cyanocobalamin (VITAMIN B-12 PO) Take 500 mcg by mouth daily.    [provider]  diazepam (VALIUM) 5 MG tablet TAKE 1 TABLET BY MOUTH EVERY 12 HOURS AS NEEDED FOR TREMORS 06/26/22   Sater, Nanine Means, MD  docusate sodium (COLACE) 100 MG capsule Take 200 mg by mouth daily as needed for mild constipation.    [provider]  DULoxetine (CYMBALTA) 60 MG capsule TAKE 1 CAPSULE (60 MG TOTAL) BY MOUTH AT BEDTIME. 05/13/22   Sater, Nanine Means, MD  feeding supplement (ENSURE ENLIVE / ENSURE PLUS) LIQD Take 237 mLs by mouth 2 (two) times daily between meals. 08/15/21   Ghimire, Henreitta Leber, MD  ferrous sulfate 325 (65 FE) MG tablet Take 325 mg by mouth every morning. 03/10/21   [provider]  guaiFENesin (MUCINEX) 600 MG 12 hr tablet Take 2 tablets (1,200 mg total) by mouth 2 (two) times daily. 08/15/21   Ghimire, Henreitta Leber, MD  HYDROcodone-acetaminophen (NORCO) 10-325 MG tablet Take 1 tablet by  mouth 3 (three) times daily as needed. 08/24/21   [provider]  loperamide (IMODIUM) 2 MG capsule Take 2 mg by mouth daily as needed for diarrhea or loose stools.    [provider]  midodrine (PROAMATINE) 5 MG tablet Take 1 tablet (5 mg total) by mouth 3 (three) times daily with meals. 09/17/21   Shelly Coss, MD  mirabegron ER (MYRBETRIQ) 50 MG TB24 tablet Take 50 mg by mouth at bedtime.    [provider]  omega-3 acid ethyl esters (LOVAZA) 1 g capsule Take 1 g by mouth every morning.    [provider]  pantoprazole (PROTONIX) 40 MG tablet Take 1 tablet (40 mg total) by mouth daily. 10/26/16   Angiulli, Lavon Paganini, PA-C  predniSONE (DELTASONE) 5 MG tablet Take 1 tablet (5 mg total) by mouth daily with breakfast. 11/22/21   Sater, Nanine Means, MD  pregabalin (LYRICA) 50 MG capsule Take 1 capsule (50 mg total) by  mouth 2 (two) times daily. 01/23/22   Sater, Nanine Means, MD  QUEtiapine (SEROQUEL) 50 MG tablet Take 1 tablet (50 mg total) by mouth at bedtime. 11/27/21   Sater, Nanine Means, MD  sodium chloride (OCEAN) 0.65 % SOLN nasal spray Place 1 spray into both nostrils as needed for congestion. 08/01/21   Oswald Hillock, MD    Physical Exam: Vitals:   06/26/22 0840 06/26/22 0845 06/26/22 0850 06/26/22 0855  BP: (!) 81/68 (!) 83/67 90/70 (!) 77/60  Pulse:  (!) 116 (!) 117   Resp: (!) 29 (!) 24 (!) 31 (!) 21  Temp:      TempSrc:      SpO2:  99% 97%   Weight:      Height:        Constitutional: Ill-appearing elderly female who appears to be in acute distress Eyes: PERRL, lids and conjunctivae normal ENMT: Mucous membranes are moist.  Neck: normal, supple  Respiratory: clear to auscultation bilaterally, no wheezing, no crackles. Normal respiratory effort. No accessory muscle use.  Cardiovascular: Tachycardic no extremity edema.  Abdomen: Suprapubic tenderness appreciated.  Bowel sounds present all 4 quadrants.  Suprapubic catheter in place draining gross  blood. Musculoskeletal: no clubbing / cyanosis. No joint deformity upper and lower extremities.  Skin: Pallor present. Neurologic: CN 2-12 grossly intact.  She is able to move all extremities Psychiatric: Normal judgment and insight. Alert and oriented x 3.  Anxious mood.   Data Reviewed:  Reviewed labs, and imaging as noted above in HPI.  Assessment and Plan: Acute blood loss anemia secondary to hematuria due to acute cystitis Patient presented with complaints of blood clots coming from suprapubic catheter and burning suprapubic pain.  Open dropped from 14 down to 11.6.  Urinalysis noted many bacteria with greater than 50 RBCs/hpf, and WBC clumps present.  Urology had been consulted and placed Foley catheter and started three-way bladder irrigation.  Blood and urine cultures have been obtained and patient has been started on empiric antibiotics of Rocephin.  Patient had been typed and screened for possible need of blood products.  She had been given Kcentra to reverse Eliquis and initially ordered to be transfused 1 unit of RBCs.  CT scan showed bladder wall thickening with blood and nonobstructing left renal calculus.  Emergency release blood was given due to hypotension, but had to be discontinued halfway through due to antibody incompatibility.  Nursing staff noted irrigation clotted off which case was discussed with Dr. Gloriann Loan on-call currently for urology and recommended holding bladder irrigation.  Review of records note prior history of resistant ESBL infections sensitive to meropenem. -Admit to a stepdown bed -N.p.o. except for meds for likely need of cystoscopy -Hold bladder irrigation -Transfuse 1 unit of PRBCs once able, but may not be warranted. -Serial monitoring of H&H -Change antibiotics to meropenem.  Most recent urine culture 05/16/2022 showed multiple species. -Hydrocodone as needed for pain as blood pressure will allow -Appreciate urology consultative services, will follow  up  SIRS/sepsis lactic acidosis Acute.  Patient was noted to be tachycardic and tachypneic with WBC elevated at 18.7 meeting SIRS criteria.  Gassett elevated at 2.5. source thought to possibly be urinary tract infection.  Antibiotics as noted above. -Follow-up cultures -Trend lactic acid levels  Hypotension Acute.  Blood pressures noted to have MAP in the 40s with improvement after IV fluid boluses.  Patient noted to be on chronic steroids.  So hypotension may be secondary to adrenal insufficiency versus acute blood loss. -  Hydrocortisone 100 mg every 8 hours in case of adrenal insufficiency -IV fluids at 100 mL/h  Neurogenic bladder with chronic indwelling suprapubic catheter Patient has a neurogenic bladder as a complication of prior history of HSV encephalitis. -Per urology  History of DVT/PE on chronic anticoagulation Patient with prior history of DVT in 2019, 2021 with PE on Xarelto, and most recently 08/2021 of right lower extremity DVT with bilateral pulmonary embolism switched to Eliquis. -Check Doppler ultrasound of lower extremities once able -Will consult interventional radiology to consider IVC filter placement  History of HSV encephalitis with chronic gait disorder chronic pain syndrome and chronic steroid use Patient ambulates with use of walker as a result of a history of HSV encephalitis.  She notes being on chronic steroids related to symptoms from this.  Reports poor quality of life. -Hydrocortisone IV as noted above -Palliative care consulted due to patient stating multiple times that she just wants to be kept comfortable and to stop all of these interventions.  Family currently asking patient to continue with treatment.  DNR Confirmed with family present at bedside   DVT prophylaxis: TED hose Advance Care Planning:   Code Status: DNR  Consults: Urology  Family Communication: Family updated at bedside  Severity of Illness: The appropriate patient status for  this patient is INPATIENT. Inpatient status is judged to be reasonable and necessary in order to provide the required intensity of service to ensure the patient's safety. The patient's presenting symptoms, physical exam findings, and initial radiographic and laboratory data in the context of their chronic comorbidities is felt to place them at high risk for further clinical deterioration. Furthermore, it is not anticipated that the patient will be medically stable for discharge from the hospital within 2 midnights of admission.   * I certify that at the point of admission it is my clinical judgment that the patient will require inpatient hospital care spanning beyond 2 midnights from the point of admission due to high intensity of service, high risk for further deterioration and high frequency of surveillance required.*  Author: Norval Morton, MD 06/26/2022 9:31 AM  For on call review [email or web address].

## 2022-06-26 NOTE — ED Notes (Signed)
Ray MD at bedside

## 2022-06-26 NOTE — ED Notes (Signed)
Notified Ray MD about pt hypotensive SBP 60's

## 2022-06-26 NOTE — Consult Note (Addendum)
Consultation Note Date: 06/26/2022   Patient Name: Katelyn Lamb  DOB: 1946-07-21  MRN: [id number]  Age / Sex: 76 y.o., female  PCP: [name], MD Referring Physician: Norval Morton, MD  Reason for Consultation: Establishing goals of care, "Hematuria with hypotension patient with history of encephalitis reports wanting to be kept comfortable"  HPI/Patient Profile: 76 y.o. female  with past medical history of DVT/PE, HSV encephalitis in 2018 with baseline gait dysfunction requiring walker, neurogenic bladder with suprapubic catheter in place, CKD, chronic pain, and seizure disorder presented to [facility] ED on 06/26/2022 from home with bleeding from her suprapubic catheter/hematuria.  Patient was admitted on 06/26/2022 with acute blood loss anemia secondary to hematuria due to acute cystitis, SIRS/sepsis/lactic acidosis, and hypotension.   ED Course:  Urology had been consulted and placed Foley catheter and started three-way bladder irrigation.  Blood and urine cultures have been obtained and patient has been started on empiric antibiotics of Rocephin.  Patient had been typed and screened for possible need of blood products.  She had been given Kcentra to reverse Eliquis and initially ordered to be transfused 1 unit of RBCs.  CT scan showed bladder wall thickening with blood and nonobstructing left renal calculus.  Emergency release blood was given due to hypotension, but had to be discontinued halfway through due to antibody incompatibility.  Nursing staff noted irrigation clotted off which case was discussed with Dr. Gloriann Loan on-call currently for urology and recommended holding bladder irrigation.  Clinical Assessment and Goals of Care: I have reviewed medical records including EPIC notes, labs, and imaging.   4:05 PM Went to visit patient at bedside - primary RN present attempting to irrigate catheter, 2 females and 1 female  present at bedside. Patient was lying in bed awake and alert. Signs/non-verbal gestures of pain noted. No respiratory distress, increased work of breathing, or secretions noted.   I introduced myself and Palliative Medicine - offered to return at a later time after irrigation. Family became quickly and visibly upset by PMT introduction stating "why were they called this is not what we want." Education provided that Palliative Medicine is specialized medical care for people living with serious illness. It focuses on providing relief from the symptoms and stress of a serious illness. The goal is to improve quality of life for both the patient and the family as well as discuss diagnosis, prognosis, GOC, EOL wishes, disposition, and options. Family seemed to calm after this; however, due to tense emotions previously ongoing prior to my arrival, the two females excused themselves from the room in clear distress. Prior to leaving they noted they needed to see Dr. Tamala Julian. Primary RN at bedside also requesting assistance in paging Dr. Tamala Julian urgently. I stepped out of the room to assist in locating Dr. Tamala Julian. Dr. Tamala Julian went to patient's bedside.  Discussed case in detail with Dr. Tamala Julian after he was finished in patient's room. He indicates now was not a good time for meaningful GOC, which I agree as patient seems to be in distress, emotions are elevated, and family are no longer at bedside. Will attempt GOC again in the near future. Discussions around possibly needing IVC filter and cystoscopy will be important.   5:25 PM Returned to patient's bedside in attempt to continue [place]. Transport was at bedside preparing to transfer patient to [facility]. Patient seemed more calm and with less pain. Emotional support provided to patient and her spouse at bedside. They are open for PMT follow up at [facility].  PMT card was provided.  Primary Decision Maker: PATIENT    SUMMARY OF RECOMMENDATIONS   Unfortunately, was unable to complete  full GOC prior to patient's transfer to [facility]. Patient and her spouse are open for PMT follow up at [place] [facility] as previously documented PMT will continue to follow and support holistically   Code Status/Advance Care Planning: DNR  Palliative Prophylaxis:  Aspiration, Delirium Protocol, Frequent Pain Assessment, and Turn Reposition  Additional Recommendations (Limitations, Scope, Preferences): Full Scope Treatment  Psycho-social/Spiritual:  Desire for further Chaplaincy support:no Created space and opportunity for patient and family to express thoughts and feelings regarding patient's current medical situation.  Emotional support and therapeutic listening provided.  Prognosis:  Unable to determine  Discharge Planning: To Be Determined      Primary Diagnoses: Present on Admission:  Hematuria due to acute cystitis  DNR (do not resuscitate)  Neurogenic bladder  History of pulmonary embolism  Incomplete paraplegia (HCC)  Chronic pain syndrome   I have reviewed the medical record, interviewed the patient and family, and examined the patient. The following aspects are pertinent.  Past Medical History:  Diagnosis Date   Acute deep vein thrombosis (DVT) of popliteal vein of left lower extremity (HCC)    Anxiety    Back pain    Chronic kidney disease    Encephalomyelitis    Gait abnormality 11/14/2016   Memory difficulty 03/02/2019   Myelitis due to herpes simplex (HCC)    Neurogenic bladder    Social History   Socioeconomic History   Marital status: Married    Spouse name: Katelyn Lamb   Number of children: 2   Years of education: 12   Highest education level: Not on file  Occupational History   Occupation: retired  Tobacco Use   Smoking status: Former    Packs/day: 2.00    Years: 20.00    Total pack years: 40.00    Types: Cigarettes   Smokeless tobacco: Never   Tobacco comments:    40 years ago   Vaping Use   Vaping Use: Never used  Substance and Sexual  Activity   Alcohol use: No   Drug use: Never   Sexual activity: Not Currently  Other Topics Concern   Not on file  Social History Narrative   Lives with husband   Caffeine 2-3 cups daily   Right-handed   Social Determinants of Health   Financial Resource Strain: Not on file  Food Insecurity: Not on file  Transportation Needs: Not on file  Physical Activity: Not on file  Stress: Not on file  Social Connections: Not on file   Family History  Problem Relation Age of Onset   Hypertension Mother    Scheduled Meds:  hydrocortisone sod succinate (SOLU-CORTEF) inj  100 mg Intravenous Q8H   sodium chloride flush  3 mL Intravenous Q12H   Continuous Infusions:  sodium chloride Stopped (06/26/22 1059)   sodium chloride 100 mL/hr at 06/26/22 1109   meropenem (MERREM) IV 1 g (06/26/22 1349)   sodium chloride     PRN Meds:.acetaminophen **OR** acetaminophen, albuterol, diphenhydrAMINE, HYDROcodone-acetaminophen Medications Prior to Admission:  Prior to Admission medications   Medication Sig Start Date End Date Taking? Authorizing Provider  acyclovir (ZOVIRAX) 400 MG tablet TAKE 1 TABLET BY MOUTH 2 TIMES DAILY. PLEASE CALL AND MAKE OVERDUE APPT FOR FURTHER REFILLS 06/11/22   Dohmeier, Asencion Partridge, MD  albuterol (PROVENTIL) (2.5 MG/3ML) 0.083% nebulizer solution Take 3 mLs (2.5 mg total) by nebulization every 2 (two) hours  as needed for wheezing or shortness of breath. 08/15/21   Ghimire, Henreitta Leber, MD  apixaban (ELIQUIS) 5 MG TABS tablet Take 1 tablet (5 mg total) by mouth 2 (two) times daily. 08/15/21   Ghimire, Henreitta Leber, MD  Ascorbic Acid (VITAMIN C) 500 MG CHEW Chew 500 mg by mouth daily.    [provider]  Calcium Carb-Cholecalciferol (CALCIUM 600 + D PO) Take 1 tablet by mouth in the morning and at bedtime.    [provider]  Cyanocobalamin (VITAMIN B-12 PO) Take 500 mcg by mouth daily.    [provider]  diazepam (VALIUM) 5 MG tablet TAKE 1 TABLET BY MOUTH  EVERY 12 HOURS AS NEEDED FOR TREMORS 06/26/22   Sater, Nanine Means, MD  diphenoxylate-atropine (LOMOTIL) 2.5-0.025 MG tablet Take by mouth. 04/19/22   [provider]  docusate sodium (COLACE) 100 MG capsule Take 200 mg by mouth daily as needed for mild constipation.    [provider]  DULoxetine (CYMBALTA) 60 MG capsule TAKE 1 CAPSULE (60 MG TOTAL) BY MOUTH AT BEDTIME. 05/13/22   Sater, Nanine Means, MD  feeding supplement (ENSURE ENLIVE / ENSURE PLUS) LIQD Take 237 mLs by mouth 2 (two) times daily between meals. 08/15/21   Ghimire, Henreitta Leber, MD  ferrous sulfate 325 (65 FE) MG tablet Take 325 mg by mouth every morning. 03/10/21   [provider]  guaiFENesin (MUCINEX) 600 MG 12 hr tablet Take 2 tablets (1,200 mg total) by mouth 2 (two) times daily. 08/15/21   Ghimire, Henreitta Leber, MD  HYDROcodone-acetaminophen (NORCO) 10-325 MG tablet Take 1 tablet by mouth 3 (three) times daily as needed. 08/24/21   [provider]  loperamide (IMODIUM) 2 MG capsule Take 2 mg by mouth daily as needed for diarrhea or loose stools.    [provider]  midodrine (PROAMATINE) 5 MG tablet Take 1 tablet (5 mg total) by mouth 3 (three) times daily with meals. 09/17/21   Shelly Coss, MD  mirabegron ER (MYRBETRIQ) 50 MG TB24 tablet Take 50 mg by mouth at bedtime.    [provider]  omega-3 acid ethyl esters (LOVAZA) 1 g capsule Take 1 g by mouth every morning.    [provider]  pantoprazole (PROTONIX) 40 MG tablet Take 1 tablet (40 mg total) by mouth daily. 10/26/16   Angiulli, Lavon Paganini, PA-C  predniSONE (DELTASONE) 5 MG tablet Take 1 tablet (5 mg total) by mouth daily with breakfast. 11/22/21   Sater, Nanine Means, MD  pregabalin (LYRICA) 50 MG capsule Take 1 capsule (50 mg total) by mouth 2 (two) times daily. 01/23/22   Sater, Nanine Means, MD  QUEtiapine (SEROQUEL) 50 MG tablet Take 1 tablet (50 mg total) by mouth at bedtime. 11/27/21   Sater, Nanine Means, MD  sodium  chloride (OCEAN) 0.65 % SOLN nasal spray Place 1 spray into both nostrils as needed for congestion. 08/01/21   Oswald Hillock, MD   Allergies  Allergen Reactions   Demerol [Meperidine] Other (See Comments)    Hallucinations   Hydrocodone-Acetaminophen Other (See Comments)   Percocet [Oxycodone-Acetaminophen] Itching   Amoxicillin-Pot Clavulanate Diarrhea    Severe pain, headache, intestinal infection   Penicillins Itching and Rash    Tolerated amoxicillin November 2022  Has patient had a PCN reaction causing immediate rash, facial/tongue/throat swelling, SOB or lightheadedness with hypotension:  NO Has patient had a PCN reaction causing severe rash involving mucus membranes or skin necrosis: No Has patient had a PCN reaction that  required hospitalization: No Has patient had a PCN reaction occurring within the last 10 years: Yes If all of the above answers are "NO", then may proceed with Cephalosporin use.   Review of Systems  Unable to perform ROS: Other    Physical Exam Vitals and nursing note reviewed.  Constitutional:      General: She is not in acute distress. Pulmonary:     Effort: No respiratory distress.  Skin:    General: Skin is warm and dry.  Neurological:     Mental Status: She is alert and oriented to person, place, and time.     Motor: Weakness present.  Psychiatric:        Attention and Perception: Attention normal.        Behavior: Behavior is cooperative.        Cognition and Memory: Cognition and memory normal.     Vital Signs: BP (!) 78/61   Pulse (!) 107   Temp 98.5 F (36.9 C) (Oral)   Resp 13   Ht '5\' 6"'$  (1.676 m)   Wt 68 kg   SpO2 96%   BMI 24.21 kg/m  Pain Scale: 0-10   Pain Score: 5    SpO2: SpO2: 96 % O2 Device:SpO2: 96 % O2 Flow Rate: .   IO: Intake/output summary:  Intake/Output Summary (Last 24 hours) at 06/26/2022 1554 Last data filed at 06/26/2022 1158 Gross per 24 hour  Intake 2229.51 ml  Output 1800 ml  Net 429.51 ml     LBM:   Baseline Weight: Weight: 68 kg Most recent weight: Weight: 68 kg     Palliative Assessment/Data:     Time In-Out: 2595-6387 / 1725-1730 Time Total: 50 minutes Greater than 50%  of this time was spent counseling and coordinating care related to the above assessment and plan.  Signed by: Lin Landsman, NP   Please contact Palliative Medicine Team phone at [phone] for questions and concerns.  For individual provider: See Amion  *Portions of this note are a verbal dictation therefore any spelling and/or grammatical errors are due to the "[employer]" system interpretation.

## 2022-06-26 NOTE — ED Notes (Signed)
Pt has antibodies per blood bank and reported that they left a message to notify this RN about pt having antibodies. Explained to blood bank staff that this RN was not notified. Blood bank requesting a copy of blood consent d/t pt having and FYI of blood refusal despite educating staff that the MD discussed this w/ the pt and her husband and has consented to blood. Explained that the consent is in the chart electronically and the urgent need to give pt blood. This RN gave her phone to Ray MD so she could discuss this issue w/ blood bank. Per Ray MD, 1 unit of O neg emergency release to be given while other blood is being tested for antibodies. Also let blood bank staff know that two pink tops were sent to them with the other blood work for their extra tube of blood needed. They are looking for it in the blood bank per staff.

## 2022-06-26 NOTE — ED Notes (Signed)
[facility] expressed concerns regarding pt VS and stability. Smith MD notified and came to bedside to discuss pt stability w/ [facility].

## 2022-06-26 NOTE — ED Notes (Signed)
EDT obtained 1 blue culture until color changed in tube d/t pt hard stick.

## 2022-06-27 ENCOUNTER — Inpatient Hospital Stay (HOSPITAL_COMMUNITY): Payer: PPO

## 2022-06-27 DIAGNOSIS — I2699 Other pulmonary embolism without acute cor pulmonale: Secondary | ICD-10-CM

## 2022-06-27 DIAGNOSIS — N3001 Acute cystitis with hematuria: Secondary | ICD-10-CM | POA: Diagnosis not present

## 2022-06-27 DIAGNOSIS — I959 Hypotension, unspecified: Secondary | ICD-10-CM | POA: Diagnosis not present

## 2022-06-27 DIAGNOSIS — A419 Sepsis, unspecified organism: Secondary | ICD-10-CM

## 2022-06-27 DIAGNOSIS — D72825 Bandemia: Secondary | ICD-10-CM

## 2022-06-27 DIAGNOSIS — N390 Urinary tract infection, site not specified: Secondary | ICD-10-CM

## 2022-06-27 DIAGNOSIS — R310 Gross hematuria: Secondary | ICD-10-CM

## 2022-06-27 DIAGNOSIS — G40909 Epilepsy, unspecified, not intractable, without status epilepticus: Secondary | ICD-10-CM

## 2022-06-27 DIAGNOSIS — R739 Hyperglycemia, unspecified: Secondary | ICD-10-CM

## 2022-06-27 DIAGNOSIS — R6520 Severe sepsis without septic shock: Secondary | ICD-10-CM

## 2022-06-27 DIAGNOSIS — D62 Acute posthemorrhagic anemia: Secondary | ICD-10-CM

## 2022-06-27 LAB — HEMOGLOBIN AND HEMATOCRIT, BLOOD
HCT: 21.2 % — ABNORMAL LOW (ref 36.0–46.0)
HCT: 20.5 % — ABNORMAL LOW (ref 36.0–46.0)
HCT: 25.1 % — ABNORMAL LOW (ref 36.0–46.0)
HCT: 28.1 % — ABNORMAL LOW (ref 36.0–46.0)
Hemoglobin: 6.9 g/dL — CL (ref 12.0–15.0)
Hemoglobin: 6.7 g/dL — CL (ref 12.0–15.0)
Hemoglobin: 8.2 g/dL — ABNORMAL LOW (ref 12.0–15.0)
Hemoglobin: 9.3 g/dL — ABNORMAL LOW (ref 12.0–15.0)

## 2022-06-27 LAB — BASIC METABOLIC PANEL
Anion gap: 10 (ref 5–15)
BUN: 27 mg/dL — ABNORMAL HIGH (ref 8–23)
CO2: 20 mmol/L — ABNORMAL LOW (ref 22–32)
Calcium: 7.5 mg/dL — ABNORMAL LOW (ref 8.9–10.3)
Chloride: 107 mmol/L (ref 98–111)
Creatinine, Ser: 0.95 mg/dL (ref 0.44–1.00)
GFR, Estimated: >60 mL/min (ref >60)
Glucose, Bld: 144 mg/dL — ABNORMAL HIGH (ref 70–99)
Potassium: 4.8 mmol/L (ref 3.5–5.1)
Sodium: 137 mmol/L (ref 135–145)

## 2022-06-27 LAB — BPAM RBC
Blood Product Expiration Date: 202311182359
Blood Product Expiration Date: 202311232359
ISSUE DATE / TIME: 202310240956
ISSUE DATE / TIME: 202310251330
Unit Type and Rh: 5100
Unit Type and Rh: 5100

## 2022-06-27 LAB — GLUCOSE, CAPILLARY
Glucose-Capillary: 134 mg/dL — ABNORMAL HIGH (ref 70–99)
Glucose-Capillary: 150 mg/dL — ABNORMAL HIGH (ref 70–99)

## 2022-06-27 LAB — URINE CULTURE: Culture: NO GROWTH

## 2022-06-27 LAB — LACTATE DEHYDROGENASE: LDH: 138 U/L (ref 98–192)

## 2022-06-27 LAB — TYPE AND SCREEN
ABO/RH(D): O POS
Antibody Screen: POSITIVE
DAT, IgG: POSITIVE
Unit division: 0
Unit division: 0

## 2022-06-27 LAB — CBC
HCT: 25.4 % — ABNORMAL LOW (ref 36.0–46.0)
Hemoglobin: 7.8 g/dL — ABNORMAL LOW (ref 12.0–15.0)
MCH: 32.6 pg (ref 26.0–34.0)
MCHC: 30.7 g/dL (ref 30.0–36.0)
MCV: 106.3 fL — ABNORMAL HIGH (ref 80.0–100.0)
Platelets: 208 10*3/uL (ref 150–400)
RBC: 2.39 MIL/uL — ABNORMAL LOW (ref 3.87–5.11)
RDW: 15.5 % (ref 11.5–15.5)
WBC: 26 10*3/uL — ABNORMAL HIGH (ref 4.0–10.5)
nRBC: 0 % (ref 0.0–0.2)

## 2022-06-27 LAB — HEMOGLOBIN A1C
Hgb A1c MFr Bld: 5.2 % (ref 4.8–5.6)
Mean Plasma Glucose: 102.54 mg/dL

## 2022-06-27 LAB — PREPARE RBC (CROSSMATCH)

## 2022-06-27 LAB — LACTIC ACID, PLASMA: Lactic Acid, Venous: 4.6 mmol/L (ref 0.5–1.9)

## 2022-06-27 MED ORDER — SODIUM CHLORIDE 0.9% IV SOLUTION: Freq: Once | INTRAVENOUS | Status: AC

## 2022-06-27 MED ORDER — SODIUM CHLORIDE 0.9 % IV SOLN
2.0000 g | INTRAVENOUS | Status: AC
  Administered 2022-06-27 – 2022-07-01 (×4): 2 g via INTRAVENOUS
  Filled 2022-06-27 (×4): qty 20

## 2022-06-27 MED ORDER — LACTATED RINGERS IV BOLUS
500.0000 mL | Freq: Once | INTRAVENOUS | Status: AC
  Administered 2022-06-27: 500 mL via INTRAVENOUS

## 2022-06-27 MED ORDER — HYDROCORTISONE SOD SUC (PF) 100 MG IJ SOLR
50.0000 mg | Freq: Three times a day (TID) | INTRAMUSCULAR | Status: DC
  Administered 2022-06-27 – 2022-06-28 (×3): 50 mg via INTRAVENOUS
  Filled 2022-06-27 (×3): qty 2

## 2022-06-27 MED ORDER — SODIUM CHLORIDE 0.9 % IR SOLN
3000.0000 mL | Status: DC
  Administered 2022-06-27 – 2022-06-29 (×6): 3000 mL

## 2022-06-27 MED ORDER — PANTOPRAZOLE SODIUM 40 MG PO TBEC
40.0000 mg | DELAYED_RELEASE_TABLET | Freq: Every day | ORAL | Status: DC
  Administered 2022-06-27 – 2022-07-02 (×6): 40 mg via ORAL
  Filled 2022-06-27 (×6): qty 1

## 2022-06-27 MED ORDER — WITCH HAZEL-GLYCERIN EX PADS
MEDICATED_PAD | CUTANEOUS | Status: DC | PRN
  Filled 2022-06-27: qty 100

## 2022-06-27 MED ORDER — ALPRAZOLAM 0.5 MG PO TABS
0.5000 mg | ORAL_TABLET | Freq: Once | ORAL | Status: AC
  Administered 2022-06-27: 0.5 mg via ORAL
  Filled 2022-06-27: qty 1

## 2022-06-27 MED ORDER — SODIUM CHLORIDE 0.9% IV SOLUTION: Freq: Once | INTRAVENOUS | Status: DC

## 2022-06-27 MED ORDER — DOCUSATE SODIUM 100 MG PO CAPS
100.0000 mg | ORAL_CAPSULE | Freq: Every day | ORAL | Status: AC | PRN
  Administered 2022-06-27: 100 mg via ORAL
  Filled 2022-06-27: qty 1

## 2022-06-27 MED ORDER — INSULIN ASPART 100 UNIT/ML IJ SOLN
0.0000 [IU] | Freq: Three times a day (TID) | INTRAMUSCULAR | Status: DC
  Administered 2022-06-27 – 2022-07-01 (×5): 1 [IU] via SUBCUTANEOUS
  Administered 2022-07-02: 2 [IU] via SUBCUTANEOUS

## 2022-06-27 MED ORDER — ACYCLOVIR 400 MG PO TABS
400.0000 mg | ORAL_TABLET | Freq: Two times a day (BID) | ORAL | Status: DC
  Administered 2022-06-27 – 2022-07-03 (×12): 400 mg via ORAL
  Filled 2022-06-27 (×13): qty 1

## 2022-06-27 MED ORDER — POLYETHYLENE GLYCOL 3350 17 G PO PACK: 17.0000 g | PACK | Freq: Every day | ORAL | Status: DC | PRN

## 2022-06-27 NOTE — Progress Notes (Signed)
Palliative:  Chart reviewed. Patient seen by palliative provider in [facility] ED 10/24. When family became aware of palliative involvement they expressed disagreement. Patient now has been transported to [facility] for ongoing care and palliative consult remains in place. Discussed with Dr. Cyndia Skeeters via epic chat - will await to hear further from medical team if family is agreeable to our involvement or not prior to reaching out again. Plan to hold off for now unless we hear otherwise.  Juel Burrow, DNP, AGNP-C Palliative Medicine Team Team Phone # [phone]  Pager # [phone]  NO CHARGE

## 2022-06-27 NOTE — Progress Notes (Signed)
Bilateral lower extremity venous duplex completed. Refer to "CV Proc" under chart review to view preliminary results.  06/27/2022 2:41 PM Kelby Aline., MHA, RVT, RDCS, RDMS

## 2022-06-27 NOTE — Progress Notes (Addendum)
PROGRESS NOTE  Katelyn Lamb:[id number] DOB: March 28, 1946   PCP: [name], MD  Patient is from: Home.  Lives with husband.  Uses walker at baseline.  DOA: 06/26/2022 LOS: 1  Chief complaints Chief Complaint  Patient presents with   Hematuria     Brief Narrative / Interim history: 76 year old F with PMH of recurrent DVT/PE on Eliquis, gait dysfunction, neurogenic bladder with suprapubic cath, seizure disorder, herpes encephalitis, chronic steroid use and ESBL UTI presenting with bleeding from suprapubic catheter for 1 day with associated burning pain, and she was admitted for acute blood loss anemia due to gross hematuria and severe sepsis due to complicated UTI.  Cultures obtained.  Received Kcentra and ceftriaxone.  Blood transfusion ordered but discontinued due to concern for incompatibility.  Urology consulted.    Subjective: Seen and examined earlier this morning.  Patient's husband at bedside.  Patient has no complaints not a great historian although she is oriented.  She denies pain, shortness of breath, nausea, vomiting or abdominal pain.  Nocturia seems to have subsided hemoglobin reached nadir at 6.7 this morning.  Objective: Vitals:   06/27/22 0700 06/27/22 0708 06/27/22 0840 06/27/22 1100  BP: (!) 106/53  (!) 122/55 (!) 100/50  Pulse: (!) 105  (!) 104 (!) 103  Resp: 17  (!) 21 18  Temp:  (!) 97.4 F (36.3 C)    TempSrc:  Oral    SpO2: 96%  94% 99%  Weight:      Height:        Examination:  GENERAL: No apparent distress.  Nontoxic. HEENT: MMM.  Vision and hearing grossly intact.  NECK: Supple.  No apparent JVD.  RESP:  No IWOB.  Fair aeration bilaterally. CVS:  RRR. Heart sounds normal.  ABD/GI/GU: BS+. Abd soft, NTND.  Prepubic catheter. MSK/EXT:  Moves extremities. No apparent deformity. No edema.  SKIN: no apparent skin lesion or wound NEURO: Awake, alert and oriented appropriately.  No apparent focal neuro deficit. PSYCH: Calm. Normal affect.    Procedures:  Continuous bladder irrigation  Microbiology summarized: Urine culture NGTD Blood culture NGTD Outside PCR screen nonreactive.  Assessment and plan: Principal Problem:   Hematuria due to acute cystitis Active Problems:   Severe sepsis (HCC)   Hypotension   Neurogenic bladder   Chronic suprapubic catheter (HCC)   History of pulmonary embolism   History of DVT (deep vein thrombosis)   Incomplete paraplegia (HCC)   Chronic pain syndrome   History of herpes encephalitis   Current chronic use of systemic steroids   DNR (do not resuscitate)   History of ESBL E. coli infection   Seizure disorder ([place])   Gross hematuria   Hyperglycemia   Complicated UTI (urinary tract infection)  Acute blood loss anemia secondary to gross hematuria from suprapubic catheter: Blood transfusion ordered yesterday but is continued due to concern for incompatibility.  Bleeding seems to have subsided.  Recent Labs    09/15/21 0503 09/15/21 0523 05/16/22 1940 06/26/22 0443 06/26/22 0820 06/26/22 1101 06/26/22 2250 06/27/22 0521 06/27/22 0817 06/27/22 1058  HGB 11.1* 12.6 13.6 14.0 11.6* 11.8* 9.5* 7.8* 6.9* 6.7*  -Oncology consulted given concern for incompatibility -Check hemolysis labs -Urology on board.  On CBI. -Continue antibiotics -Discontinue IV fluid -Monitor H&H q4 hours and transfuse for Hgb <7.0   Severe sepsis due to complicated UTI: POA SIRS, lactic acidosis and hypotension although hypotension could be also be hemorrhagic.  Hypotension resolved. Patient has history of ESBL blood cultures NGTD so  far -Currently on meropenem.  I think we can de-escalate antibiotics -Monitor leukocytosis -Reduce stress dose steroid -Might be a good idea to change suprapubic catheter   Hypotension: Likely combination of sepsis and hemorrhage.  She could have adrenal insufficiency from chronic steroid use.  Resolved. -Decrease stress dose steroid  Lactic acidosis: Dynamically  stable. -Monitor   Neurogenic bladder with chronic indwelling suprapubic catheter: Complication of HSV encephalitis. -Urology on board   History of recurrent DVT/PE on chronic anticoagulation -Hold anticoagulation in the setting of bleeding   History of HSV encephalitis/chronic gait disorder/chronic pain syndrome and chronic steroid use -DNR/DNI. -Palliative following.  Bandemia: Could be due to sepsis or steroid. -Continue monitoring  Hyperglycemia: Likely due to steroid. -Start CBG monitoring and SSI   Body mass index is 29.39 kg/m.    DVT prophylaxis:  Place TED hose Start: 06/26/22 0950  Code Status: DNR/DNI Family Communication: Updated patient's husband at bedside Level of care: Stepdown Status is: Inpatient Remains inpatient appropriate because: Gross hematuria, severe sepsis   Final disposition: TBD Consultants:  Urology Oncology/hematology Palliative medicine  Sch Meds:  Scheduled Meds:  sodium chloride   Intravenous Once   Chlorhexidine Gluconate Cloth  6 each Topical Daily   hydrocortisone sod succinate (SOLU-CORTEF) inj  50 mg Intravenous Q8H   insulin aspart  0-9 Units Subcutaneous TID WC   sodium chloride flush  3 mL Intravenous Q12H   Continuous Infusions:  sodium chloride Stopped (06/26/22 1059)   sodium chloride 100 mL/hr at 06/27/22 0913   meropenem (MERREM) IV Stopped (06/27/22 3825)   PRN Meds:.acetaminophen **OR** acetaminophen, albuterol, diphenhydrAMINE, HYDROcodone-acetaminophen  Antimicrobials: Anti-infectives (From admission, onward)    Start     Dose/Rate Route Frequency Ordered Stop   06/26/22 1400  meropenem (MERREM) 1 g in sodium chloride 0.9 % 100 mL IVPB        1 g 200 mL/hr over 30 Minutes Intravenous Every 8 hours 06/26/22 1228     06/26/22 0915  cefTRIAXone (ROCEPHIN) 1 g in sodium chloride 0.9 % 100 mL IVPB        1 g 200 mL/hr over 30 Minutes Intravenous  Once 06/26/22 0913 06/26/22 1143        I have  personally reviewed the following labs and images: CBC: Recent Labs  Lab 06/26/22 0443 06/26/22 0820 06/26/22 1101 06/26/22 2250 06/27/22 0521 06/27/22 0817 06/27/22 1058  WBC 15.0* 18.7*  --   --  26.0*  --   --   NEUTROABS  --  10.7*  --   --   --   --   --   HGB 14.0 11.6* 11.8* 9.5* 7.8* 6.9* 6.7*  HCT 45.2 37.9 37.1 30.0* 25.4* 21.2* 20.5*  MCV 104.1* 105.9*  --   --  106.3*  --   --   PLT 303 310  --   --  208  --   --    BMP &GFR Recent Labs  Lab 06/26/22 0443 06/27/22 0255  NA 141 137  K 4.6 4.8  CL 101 107  CO2 25 20*  GLUCOSE 112* 144*  BUN 24* 27*  CREATININE 0.79 0.95  CALCIUM 9.0 7.5*   Estimated Creatinine Clearance: 54.3 mL/min (by C-G formula based on SCr of 0.95 mg/dL). Liver & Pancreas: No results for input(s): "AST", "ALT", "ALKPHOS", "BILITOT", "PROT", "ALBUMIN" in the last 168 hours. No results for input(s): "LIPASE", "AMYLASE" in the last 168 hours. No results for input(s): "AMMONIA" in the last 168 hours. Diabetic: No  results for input(s): "HGBA1C" in the last 72 hours. No results for input(s): "GLUCAP" in the last 168 hours. Cardiac Enzymes: No results for input(s): "CKTOTAL", "CKMB", "CKMBINDEX", "TROPONINI" in the last 168 hours. No results for input(s): "PROBNP" in the last 8760 hours. Coagulation Profile: Recent Labs  Lab 06/26/22 0900  INR 1.5*   Thyroid Function Tests: No results for input(s): "TSH", "T4TOTAL", "FREET4", "T3FREE", "THYROIDAB" in the last 72 hours. Lipid Profile: No results for input(s): "CHOL", "HDL", "LDLCALC", "TRIG", "CHOLHDL", "LDLDIRECT" in the last 72 hours. Anemia Panel: No results for input(s): "VITAMINB12", "FOLATE", "FERRITIN", "TIBC", "IRON", "RETICCTPCT" in the last 72 hours. Urine analysis:    Component Value Date/Time   COLORURINE RED (A) 06/26/2022 0900   APPEARANCEUR TURBID (A) 06/26/2022 0900   LABSPEC  06/26/2022 0900    TEST NOT REPORTED DUE TO COLOR INTERFERENCE OF URINE PIGMENT   PHURINE   06/26/2022 0900    TEST NOT REPORTED DUE TO COLOR INTERFERENCE OF URINE PIGMENT   GLUCOSEU (A) 06/26/2022 0900    TEST NOT REPORTED DUE TO COLOR INTERFERENCE OF URINE PIGMENT   HGBUR (A) 06/26/2022 0900    TEST NOT REPORTED DUE TO COLOR INTERFERENCE OF URINE PIGMENT   BILIRUBINUR (A) 06/26/2022 0900    TEST NOT REPORTED DUE TO COLOR INTERFERENCE OF URINE PIGMENT   KETONESUR (A) 06/26/2022 0900    TEST NOT REPORTED DUE TO COLOR INTERFERENCE OF URINE PIGMENT   PROTEINUR (A) 06/26/2022 0900    TEST NOT REPORTED DUE TO COLOR INTERFERENCE OF URINE PIGMENT   NITRITE (A) 06/26/2022 0900    TEST NOT REPORTED DUE TO COLOR INTERFERENCE OF URINE PIGMENT   LEUKOCYTESUR (A) 06/26/2022 0900    TEST NOT REPORTED DUE TO COLOR INTERFERENCE OF URINE PIGMENT   Sepsis Labs: Invalid input(s): "PROCALCITONIN", "LACTICIDVEN"  Microbiology: Recent Results (from the past 240 hour(s))  Blood culture (routine x 2)     Status: None (Preliminary result)   Collection Time: 06/26/22  8:33 AM   Specimen: BLOOD RIGHT HAND  Result Value Ref Range Status   Specimen Description BLOOD RIGHT HAND  Final   Special Requests   Final    BOTTLES DRAWN AEROBIC ONLY Blood Culture results may not be optimal due to an inadequate volume of blood received in culture bottles   Culture   Final    NO GROWTH < 24 HOURS Performed at [facility], [address] [place], [place] [address]    Report Status PENDING  Incomplete  Urine Culture     Status: None   Collection Time: 06/26/22  9:13 AM   Specimen: Urine, Catheterized  Result Value Ref Range Status   Specimen Description URINE, CATHETERIZED  Final   Special Requests NONE  Final   Culture   Final    NO GROWTH Performed at [facility] [address], [place], [place] [address]    Report Status 06/27/2022 FINAL  Final  MRSA Next Gen by PCR, Nasal     Status: None   Collection Time: 06/26/22  7:02 PM   Specimen: Nasal Mucosa; Nasal Swab  Result Value Ref  Range Status   MRSA by PCR Next Gen NOT DETECTED NOT DETECTED Final    Comment: (NOTE) The GeneXpert MRSA Assay (FDA approved for NASAL specimens only), is one component of a comprehensive MRSA colonization surveillance program. It is not intended to diagnose MRSA infection nor to guide or monitor treatment for MRSA infections. Test performance is not FDA approved  in patients less than 51 years old. Performed at [facility], [place] [address] [place], [place] [address]     Radiology Studies: No results found.    Benford Asch T. Graham  If 7PM-7AM, please contact night-coverage [email or web address] 06/27/2022, 1:00 PM

## 2022-06-27 NOTE — Consult Note (Signed)
Reason for the request:    Anemia, evaluation for hemolysis  HPI: I was asked by Dr. Cyndia Skeeters  to evaluate Katelyn Lamb for the evaluation of anemia.  She is a 76 year old woman with history of deep vein thrombosis chronically anticoagulated with Eliquis.  She presented with gross hematuria and initial hemoglobin of 14 that dropped significantly to 7.8.  Subsequently her hemoglobin was down to 6.7 and attempted transfusion was delayed due to due to autoantibodies.  She is currently receiving bladder irrigation and CT scan did not show any acute pathology.  She has a suprapubic catheter and the bleeding has subsided at this time.  Other labs so far showed a bilirubin of 0.5, LDH of 138 with haptoglobin pending.  She did have a positive IgG DAT.  Clinically, she denies chest pain or shortness of breath.  She denies any cough or wheezing. Performance status is limited due to neurological condition although her mental status appears intact.     Past Medical History:  Diagnosis Date   Acute deep vein thrombosis (DVT) of popliteal vein of left lower extremity (HCC)    Anxiety    Back pain    Chronic kidney disease    Encephalomyelitis    Gait abnormality 11/14/2016   Memory difficulty 03/02/2019   Myelitis due to herpes simplex ([place])    Neurogenic bladder   :   Past Surgical History:  Procedure Laterality Date   ABDOMINAL HYSTERECTOMY     BLADDER REPAIR     CESAREAN SECTION     IR ANGIO EXTERNAL CAROTID SEL EXT CAROTID BILAT MOD SED  07/18/2021   IR ANGIO INTRA EXTRACRAN SEL INTERNAL CAROTID BILAT MOD SED  07/18/2021   IR KYPHO LUMBAR INC FX REDUCE BONE BX UNI/BIL CANNULATION INC/IMAGING  04/11/2018   IR NEURO EACH ADD'L AFTER BASIC UNI LEFT (MS)  07/18/2021   IR NEURO EACH ADD'L AFTER BASIC UNI RIGHT (MS)  07/18/2021   IR TRANSCATH/EMBOLIZ  07/18/2021   IR US GUIDE VASC ACCESS RIGHT  07/18/2021   RADIOLOGY WITH ANESTHESIA N/A 07/18/2021   Procedure: IR WITH ANESTHESIA;  Surgeon: Pedro Earls, MD;  Location: [facility];  Service: Radiology;  Laterality: N/A;   TUBAL LIGATION    :   Current Facility-Administered Medications:    0.9 %  sodium chloride infusion (Manually program via Guardrails IV Fluids), , Intravenous, Once, Bell, Wonda Cheng III, MD   0.9 %  sodium chloride infusion, 10 mL/hr, Intravenous, Once, Fuller Plan A, MD, Held at 06/26/22 1059   0.9 %  sodium chloride infusion, , Intravenous, Continuous, Fuller Plan A, MD, Last Rate: 100 mL/hr at 06/27/22 0913, New Bag at 06/27/22 0913   acetaminophen (TYLENOL) tablet 650 mg, 650 mg, Oral, Q6H PRN **OR** acetaminophen (TYLENOL) suppository 650 mg, 650 mg, Rectal, Q6H PRN, Smith, Rondell A, MD   albuterol (PROVENTIL) (2.5 MG/3ML) 0.083% nebulizer solution 2.5 mg, 2.5 mg, Nebulization, Q6H PRN, Tamala Julian, Rondell A, MD   Chlorhexidine Gluconate Cloth 2 % PADS 6 each, 6 each, Topical, Daily, Tamala Julian, Rondell A, MD, 6 each at 06/26/22 1811   diphenhydrAMINE (BENADRYL) injection 12.5 mg, 12.5 mg, Intravenous, Q6H PRN, Tamala Julian, Rondell A, MD   HYDROcodone-acetaminophen (NORCO/VICODIN) 5-325 MG per tablet 1 tablet, 1 tablet, Oral, Q6H PRN, Fuller Plan A, MD, 1 tablet at 06/26/22 1202   hydrocortisone sodium succinate (SOLU-CORTEF) 100 MG injection 100 mg, 100 mg, Intravenous, Q8H, Smith, Rondell A, MD, 100 mg at 06/27/22 0915   meropenem (MERREM) 1  g in sodium chloride 0.9 % 100 mL IVPB, 1 g, Intravenous, Q8H, Ventura Sellers, RPH, Stopped at 06/27/22 2831   sodium chloride flush (NS) 0.9 % injection 3 mL, 3 mL, Intravenous, Q12H, Smith, Rondell A, MD, 3 mL at 06/27/22 0913:   Allergies  Allergen Reactions   Demerol [Meperidine] Other (See Comments)    Hallucinations   Hydrocodone-Acetaminophen Other (See Comments)   Percocet [Oxycodone-Acetaminophen] Itching   Amoxicillin-Pot Clavulanate Diarrhea    Severe pain, headache, intestinal infection   Penicillins Itching and Rash    Tolerated amoxicillin November  2022  Has patient had a PCN reaction causing immediate rash, facial/tongue/throat swelling, SOB or lightheadedness with hypotension:  NO Has patient had a PCN reaction causing severe rash involving mucus membranes or skin necrosis: No Has patient had a PCN reaction that required hospitalization: No Has patient had a PCN reaction occurring within the last 10 years: Yes If all of the above answers are "NO", then may proceed with Cephalosporin use.  :   Family History  Problem Relation Age of Onset   Hypertension Mother   :   Social History   Socioeconomic History   Marital status: Married    Spouse name: [name]   Number of children: 2   Years of education: 12   Highest education level: Not on file  Occupational History   Occupation: retired  Tobacco Use   Smoking status: Former    Packs/day: 2.00    Years: 20.00    Total pack years: 40.00    Types: Cigarettes   Smokeless tobacco: Never   Tobacco comments:    40 years ago   Vaping Use   Vaping Use: Never used  Substance and Sexual Activity   Alcohol use: No   Drug use: Never   Sexual activity: Not Currently  Other Topics Concern   Not on file  Social History Narrative   Lives with husband   Caffeine 2-3 cups daily   Right-handed   Social Determinants of Health   Financial Resource Strain: Not on file  Food Insecurity: Not on file  Transportation Needs: Not on file  Physical Activity: Not on file  Stress: Not on file  Social Connections: Not on file  Intimate Partner Violence: Not on file  :  Pertinent items are noted in HPI.  Exam: Blood pressure (!) 100/50, pulse (!) 103, temperature (!) 97.4 F (36.3 C), temperature source Oral, resp. rate 18, height '5\' 5"'$  (1.651 m), weight 176 lb 9.4 oz (80.1 kg), SpO2 99 %. General appearance: alert and cooperative appeared without distress. Head: atraumatic without any abnormalities. Eyes: conjunctivae/corneas clear. PERRL.  Sclera anicteric. Throat: lips, mucosa,  and tongue normal; without oral thrush or ulcers. Resp: clear to auscultation bilaterally without rhonchi, wheezes or dullness to percussion. Cardio: regular rate and rhythm, S1, S2 normal, no murmur, click, rub or gallop GI: soft, non-tender; bowel sounds normal; no masses,  no organomegaly Skin: Skin color, texture, turgor normal. No rashes or lesions Lymph nodes: Cervical, supraclavicular, and axillary nodes normal. Neurologic: Grossly normal without any motor, sensory or deep tendon reflexes. Musculoskeletal: No joint deformity or effusion.   Recent Labs    06/26/22 0820 06/26/22 1101 06/27/22 0521 06/27/22 0817 06/27/22 1058  WBC 18.7*  --  26.0*  --   --   HGB 11.6*   < > 7.8* 6.9* 6.7*  HCT 37.9   < > 25.4* 21.2* 20.5*  PLT 310  --  208  --   --    < > =  values in this interval not displayed.    Recent Labs    06/26/22 0443 06/27/22 0255  NA 141 137  K 4.6 4.8  CL 101 107  CO2 25 20*  GLUCOSE 112* 144*  BUN 24* 27*  CREATININE 0.79 0.95  CALCIUM 9.0 7.5*       CT ABDOMEN PELVIS WO CONTRAST  Result Date: 06/26/2022 CLINICAL DATA:  Gross hematuria, has suprapubic catheter, had red streaks and collection bag yesterday, today frank red urine, on Eliquis, golf ball sized clots EXAM: CT ABDOMEN AND PELVIS WITHOUT CONTRAST TECHNIQUE: Multidetector CT imaging of the abdomen and pelvis was performed following the standard protocol without IV contrast. RADIATION DOSE REDUCTION: This exam was performed according to the departmental dose-optimization program which includes automated exposure control, adjustment of the mA and/or kV according to patient size and/or use of iterative reconstruction technique. COMPARISON:  08/07/2021 FINDINGS: Lower chest: Lung bases clear Hepatobiliary: Gallbladder and liver normal appearance. No biliary dilatation. Pancreas: Normal appearance Spleen: Normal appearance Adrenals/Urinary Tract: Adrenal glands normal appearance. Tiny nonobstructing  calculus at inferior pole LEFT kidney. Kidneys otherwise normal appearance without mass or hydronephrosis. Slightly increased attenuation of the decompressed RIGHT renal pelvis and proximal RIGHT ureter, cannot exclude blood. LEFT ureter is low-attenuation. Foley catheter and suprapubic catheter balloons within urinary bladder. Significant bladder wall thickening. High attenuation material within bladder lumen consistent with blood. Minimal air within bladder lumen. No focal bladder mass identified though a bladder mass could be obscured by the blood and catheters. Stomach/Bowel: Minimal distal colonic diverticulosis without evidence of diverticulitis. Stool RIGHT colon. Appendix normal caliber with high attenuation material versus appendicolith in distal lumen. Stomach and remaining bowel loops normal appearance. Vascular/Lymphatic: Atherosclerotic calcifications aorta and iliac arteries without aneurysm Reproductive: Uterus surgically absent. Nonvisualization of ovaries. Other: Tiny umbilical hernia containing fat. No free air or free fluid. No inflammatory process. Musculoskeletal: Osseous demineralization. Old inferior LEFT pubic ramus fracture deformity. Prior L5 and L2 spinal augmentation procedures. Superior endplate height losses of multiple thoracic and lumbar vertebra. IMPRESSION: Significant bladder wall thickening with high attenuation material within bladder lumen consistent with blood; no definite bladder mass identified though a bladder mass could be obscured by the blood and catheters. Slightly increased attenuation of the decompressed RIGHT renal pelvis and proximal RIGHT ureter, question hematuria. Tiny nonobstructing LEFT renal calculus. Minimal distal colonic diverticulosis without evidence of diverticulitis. Tiny umbilical hernia containing fat. Aortic Atherosclerosis (ICD10-I70.0). Electronically Signed   By: Lavonia Dana M.D.   On: 06/26/2022 09:41    Assessment and Plan:    76 year old  with:  1.  Anemia related to blood loss from hematuria with hemoglobin dropping to 6.9 with baseline hemoglobin close to 14.  Her hemolysis work-up is currently ongoing although I see no evidence to suggest that at this time.  Her LDH, bilirubin are within normal range which goes against brisk hemolysis.  From a management standpoint, I do not have any objections to packed red cell transfusion with compatible blood which would likely take longer given her antibody panel.  I do not think as she has active hemolysis that is contributing to her anemia at this time.  2.  Venous thromboembolism: She was currently on Eliquis and currently on hold given her active bleeding.  This can be resumed in the future once her bleeding has resolved.  3.  Positive DAT with IgG: I do not think this translates into autoimmune hemolysis and should not be contraindication to transfusion.  Please call with  any questions regarding this patient.  50  minutes were dedicated to this visit.  50% of the time was face-to-face.  Time spent on laboratory data, imaging studies, discussing differential diagnosis and answering questions regarding future plan.     A copy of this consult has been forwarded to the requesting physician.

## 2022-06-27 NOTE — Progress Notes (Signed)
Subjective: Ms Yagi had an OK night after transferring to [facility]. No further catheter irrigations were needed.   Her blood counts have continued to trend down, although her urine looks improved this morning and is light pink on low/med CBI. Blood pressures look to have improved somewhat overnight as well  Ms Hohensee received a partial blood transfusion yesterday which was stopped due to incompatibility. It looks like another unit was ordered but it is unclear if she received any more blood  Urine culture negative this morning, blood cultures negative so far.  Objective: Vital signs in last 24 hours: Temp:  [97.4 F (36.3 C)-98.8 F (37.1 C)] 97.4 F (36.3 C) (10/25 0708) Pulse Rate:  [61-120] 104 (10/25 0840) Resp:  [13-34] 21 (10/25 0840) BP: (40-124)/(23-76) 122/55 (10/25 0840) SpO2:  [87 %-100 %] 94 % (10/25 0840) Weight:  [80.1 kg] 80.1 kg (10/24 1820)  Intake/Output from previous day: 10/24 0701 - 10/25 0700 In: 14664.6 [I.V.:849.9; IV Piggyback:2914.7] Out: 67124 [Urine:14250] Intake/Output this shift: Total I/O In: 62.1 [I.V.:60; IV Piggyback:2.2] Out: 1450 [Urine:1450]  Physical Exam:  General: Alert and oriented CV: RRR Lungs: Clear Abdomen: Soft, ND, ATTP Ext: NT, No erythema  Lab Results: Recent Labs    06/26/22 2250 06/27/22 0521 06/27/22 0817  HGB 9.5* 7.8* 6.9*  HCT 30.0* 25.4* 21.2*   BMET Recent Labs    06/26/22 0443 06/27/22 0255  NA 141 137  K 4.6 4.8  CL 101 107  CO2 25 20*  GLUCOSE 112* 144*  BUN 24* 27*  CREATININE 0.79 0.95  CALCIUM 9.0 7.5*     Studies/Results: CT ABDOMEN PELVIS WO CONTRAST  Result Date: 06/26/2022 CLINICAL DATA:  Gross hematuria, has suprapubic catheter, had red streaks and collection bag yesterday, today frank red urine, on Eliquis, golf ball sized clots EXAM: CT ABDOMEN AND PELVIS WITHOUT CONTRAST TECHNIQUE: Multidetector CT imaging of the abdomen and pelvis was performed following the standard protocol without  IV contrast. RADIATION DOSE REDUCTION: This exam was performed according to the departmental dose-optimization program which includes automated exposure control, adjustment of the mA and/or kV according to patient size and/or use of iterative reconstruction technique. COMPARISON:  08/07/2021 FINDINGS: Lower chest: Lung bases clear Hepatobiliary: Gallbladder and liver normal appearance. No biliary dilatation. Pancreas: Normal appearance Spleen: Normal appearance Adrenals/Urinary Tract: Adrenal glands normal appearance. Tiny nonobstructing calculus at inferior pole LEFT kidney. Kidneys otherwise normal appearance without mass or hydronephrosis. Slightly increased attenuation of the decompressed RIGHT renal pelvis and proximal RIGHT ureter, cannot exclude blood. LEFT ureter is low-attenuation. Foley catheter and suprapubic catheter balloons within urinary bladder. Significant bladder wall thickening. High attenuation material within bladder lumen consistent with blood. Minimal air within bladder lumen. No focal bladder mass identified though a bladder mass could be obscured by the blood and catheters. Stomach/Bowel: Minimal distal colonic diverticulosis without evidence of diverticulitis. Stool RIGHT colon. Appendix normal caliber with high attenuation material versus appendicolith in distal lumen. Stomach and remaining bowel loops normal appearance. Vascular/Lymphatic: Atherosclerotic calcifications aorta and iliac arteries without aneurysm Reproductive: Uterus surgically absent. Nonvisualization of ovaries. Other: Tiny umbilical hernia containing fat. No free air or free fluid. No inflammatory process. Musculoskeletal: Osseous demineralization. Old inferior LEFT pubic ramus fracture deformity. Prior L5 and L2 spinal augmentation procedures. Superior endplate height losses of multiple thoracic and lumbar vertebra. IMPRESSION: Significant bladder wall thickening with high attenuation material within bladder lumen  consistent with blood; no definite bladder mass identified though a bladder mass could be obscured by the blood  and catheters. Slightly increased attenuation of the decompressed RIGHT renal pelvis and proximal RIGHT ureter, question hematuria. Tiny nonobstructing LEFT renal calculus. Minimal distal colonic diverticulosis without evidence of diverticulitis. Tiny umbilical hernia containing fat. Aortic Atherosclerosis (ICD10-I70.0). Electronically Signed   By: Lavonia Dana M.D.   On: 06/26/2022 09:41    Assessment/Plan: IMANI FIEBELKORN is a 76 yo F with hematuria, blood loss anemia. Blood counts trended down overnight, although urine is improved this morning.   --Blood transfusion ordered this morning; transfuse PRN --Urine seems to be improving with conservative measures, but will be following closely.     LOS: 1 day   Donald Pore MD 06/27/2022, 9:54 AM [facility]  Pager: [phone]

## 2022-06-27 NOTE — TOC Initial Note (Signed)
Transition of Care [facility]) - Initial/Assessment Note    Patient Details  Name: Katelyn Lamb MRN: [id number] Date of Birth: 1946-05-25  Transition of Care [facility]) CM/SW Contact:    Leeroy Cha, RN Phone Number: 06/27/2022, 8:19 AM  Clinical Narrative:                  Transition of Care [facility]) Screening Note   Patient Details  Name: Katelyn Lamb Date of Birth: [birth year]   Transition of Care [facility]) CM/SW Contact:    Leeroy Cha, RN Phone Number: 06/27/2022, 8:19 AM    Transition of Care Department [facility]) has reviewed patient and no TOC needs have been identified at this time. We will continue to monitor patient advancement through interdisciplinary progression rounds. If new patient transition needs arise, please place a TOC consult.    Expected Discharge Plan: Home/Self Care Barriers to Discharge: Continued Medical Work up   Patient Goals and CMS Choice Patient states their goals for this hospitalization and ongoing recovery are:: to go home CMS Medicare.gov Compare Post Acute Care list provided to:: Patient    Expected Discharge Plan and Services Expected Discharge Plan: Home/Self Care   Discharge Planning Services: CM Consult   Living arrangements for the past 2 months: Single Family Home                                      Prior Living Arrangements/Services Living arrangements for the past 2 months: Single Family Home Lives with:: Spouse Patient language and need for interpreter reviewed:: Yes Do you feel safe going back to the place where you live?: Yes            Criminal Activity/Legal Involvement Pertinent to Current Situation/Hospitalization: No - Comment as needed  Activities of Daily Living      Permission Sought/Granted                  Emotional Assessment Appearance:: Appears stated age Attitude/Demeanor/Rapport: Engaged Affect (typically observed): Calm Orientation: : Oriented to Self, Oriented to Place, Oriented  to  Time, Oriented to Situation Alcohol / Substance Use: Tobacco Use (tobacco use history of smoking quit years ago.) Psych Involvement: No (comment)  Admission diagnosis:  Obstruction of suprapubic catheter, initial encounter ([place]) [T83.090A] Hematuria due to acute cystitis [N30.01] Hematuria, unspecified type [R31.9] Patient Active Problem List   Diagnosis Date Noted   Hematuria due to acute cystitis 06/26/2022   History of DVT (deep vein thrombosis) 06/26/2022   Current chronic use of systemic steroids 06/26/2022   Weakness of left upper extremity 09/15/2021   Chronic suprapubic catheter ([place]) 09/15/2021   History of pulmonary embolism 09/15/2021   Acute pulmonary embolism ([place]) 08/07/2021   Abdominal pain 08/07/2021   Elevated troponin 08/07/2021   Lactic acidosis 08/07/2021   Seizure disorder ([place]) 08/07/2021   GERD (gastroesophageal reflux disease) 08/07/2021   Platelet inhibition due to Plavix    Epistaxis s/p embolization 07-18-2021 07/17/2021   Asymptomatic bacteriuria    History of ESBL E. coli infection 07/14/2021   History of herpes encephalitis 07/14/2021   Generalized weakness 03/15/2021   Acute cystitis with hematuria 03/15/2021   Encephalopathy 03/15/2021   Recurrent falls 03/15/2021   Right pulmonary embolus ([place]) 02/12/2020   Lower extremity weakness 02/02/2020   Macrocytic anemia 02/02/2020   Spinal stenosis 02/02/2020   History of recurrent UTI (urinary tract infection)  Bacterial infection due to Klebsiella pneumoniae    Laceration of left hand 12/09/2019   DNR (do not resuscitate) 12/09/2019   Pelvic fracture ([place]) 12/06/2019   AKI (acute kidney injury) ([place]) 12/06/2019   Urinary tract infection associated with catheterization of urinary tract, initial encounter ([place]) 07/23/2019   Hematuria 07/23/2019   Acute respiratory failure with hypoxia ([place]) 07/23/2019   Hypokalemia 07/23/2019   Acute on chronic anemia 07/23/2019   Fever 07/23/2019    Leukocytosis 07/23/2019   Sepsis ([place]) 07/23/2019   Generalized anxiety disorder 06/17/2019   Menopausal sweats 06/17/2019   Memory difficulty 03/02/2019   Degenerative spondylolisthesis 09/02/2018   Delirium 03/26/2018   SIRS (systemic inflammatory response syndrome) ([place]) 03/26/2018   Degeneration of lumbar intervertebral disc 11/08/2017   Lumbar radiculopathy 11/06/2017   Chronic neck pain 09/12/2017   Chronic low back pain 09/06/2017   Chronic pain syndrome 09/06/2017   Dilated pancreatic duct 01/24/2017   DVT, lower extremity, distal, chronic ([place]) 01/22/2017   Elevated serum GGT level 01/22/2017   Medication monitoring encounter 01/08/2017   Neurologic gait dysfunction 11/14/2016   Hypotension    Urinary retention    Anxiety about health    Reactive depression    Ataxia    Abdominal spasms    Constipation due to pain medication    Acute lower UTI    Dysuria    Acute deep vein thrombosis (DVT) of popliteal vein of left lower extremity (HCC)    Incomplete paraplegia (HCC)    Acute blood loss anemia    Neurogenic bladder    Neuropathic pain    Muscle spasm    Gastroesophageal reflux disease    Slow transit constipation    Thrombocytopenia (HCC) 10/03/2016   Abnormal MRI, spinal cord    Encephalomyelitis    Numbness    Intractable back pain 09/20/2016   Numbness of left lower extremity 09/20/2016   Hyponatremia 09/20/2016   Herpes zoster without complication 71/02/2693   Spondylosis of cervical region without myelopathy or radiculopathy 06/16/2015   PCP:  [name], MD Pharmacy:   [id number] [place] [address] [id number]: [phone]: [phone]    Social Determinants of Health (SDOH) Interventions    Readmission Risk Interventions   No data to display

## 2022-06-28 ENCOUNTER — Other Ambulatory Visit: Payer: Self-pay

## 2022-06-28 DIAGNOSIS — I959 Hypotension, unspecified: Secondary | ICD-10-CM | POA: Diagnosis not present

## 2022-06-28 DIAGNOSIS — A419 Sepsis, unspecified organism: Secondary | ICD-10-CM | POA: Diagnosis not present

## 2022-06-28 DIAGNOSIS — R578 Other shock: Secondary | ICD-10-CM

## 2022-06-28 DIAGNOSIS — N3001 Acute cystitis with hematuria: Secondary | ICD-10-CM | POA: Diagnosis not present

## 2022-06-28 DIAGNOSIS — E876 Hypokalemia: Secondary | ICD-10-CM

## 2022-06-28 DIAGNOSIS — K5900 Constipation, unspecified: Secondary | ICD-10-CM

## 2022-06-28 DIAGNOSIS — N390 Urinary tract infection, site not specified: Secondary | ICD-10-CM | POA: Diagnosis not present

## 2022-06-28 DIAGNOSIS — R739 Hyperglycemia, unspecified: Secondary | ICD-10-CM

## 2022-06-28 DIAGNOSIS — D72825 Bandemia: Secondary | ICD-10-CM

## 2022-06-28 LAB — CBC WITH DIFFERENTIAL/PLATELET
Abs Immature Granulocytes: 0.52 10*3/uL — ABNORMAL HIGH (ref 0.00–0.07)
Basophils Absolute: 0.1 10*3/uL (ref 0.0–0.1)
Basophils Relative: 0 %
Eosinophils Absolute: 0 10*3/uL (ref 0.0–0.5)
Eosinophils Relative: 0 %
HCT: 24.7 % — ABNORMAL LOW (ref 36.0–46.0)
Hemoglobin: 8.3 g/dL — ABNORMAL LOW (ref 12.0–15.0)
Immature Granulocytes: 2 %
Lymphocytes Relative: 14 %
Lymphs Abs: 3.7 10*3/uL (ref 0.7–4.0)
MCH: 31.7 pg (ref 26.0–34.0)
MCHC: 33.6 g/dL (ref 30.0–36.0)
MCV: 94.3 fL (ref 80.0–100.0)
Monocytes Absolute: 2.1 10*3/uL — ABNORMAL HIGH (ref 0.1–1.0)
Monocytes Relative: 8 %
Neutro Abs: 21.2 10*3/uL — ABNORMAL HIGH (ref 1.7–7.7)
Neutrophils Relative %: 76 %
Platelets: 199 10*3/uL (ref 150–400)
RBC: 2.62 MIL/uL — ABNORMAL LOW (ref 3.87–5.11)
RDW: 17.3 % — ABNORMAL HIGH (ref 11.5–15.5)
WBC: 27.6 10*3/uL — ABNORMAL HIGH (ref 4.0–10.5)
nRBC: 0.5 % — ABNORMAL HIGH (ref 0.0–0.2)

## 2022-06-28 LAB — MAGNESIUM: Magnesium: 1.9 mg/dL (ref 1.7–2.4)

## 2022-06-28 LAB — COMPREHENSIVE METABOLIC PANEL
ALT: 35 U/L (ref 0–44)
AST: 41 U/L (ref 15–41)
Albumin: 2.9 g/dL — ABNORMAL LOW (ref 3.5–5.0)
Alkaline Phosphatase: 58 U/L (ref 38–126)
Anion gap: 7 (ref 5–15)
BUN: 24 mg/dL — ABNORMAL HIGH (ref 8–23)
CO2: 22 mmol/L (ref 22–32)
Calcium: 7.9 mg/dL — ABNORMAL LOW (ref 8.9–10.3)
Chloride: 111 mmol/L (ref 98–111)
Creatinine, Ser: 0.93 mg/dL (ref 0.44–1.00)
GFR, Estimated: >60 mL/min (ref >60)
Glucose, Bld: 125 mg/dL — ABNORMAL HIGH (ref 70–99)
Potassium: 3.4 mmol/L — ABNORMAL LOW (ref 3.5–5.1)
Sodium: 140 mmol/L (ref 135–145)
Total Bilirubin: 0.5 mg/dL (ref 0.3–1.2)
Total Protein: 5.4 g/dL — ABNORMAL LOW (ref 6.5–8.1)

## 2022-06-28 LAB — PROTIME-INR
INR: 1.1 (ref 0.8–1.2)
Prothrombin Time: 14 seconds (ref 11.4–15.2)

## 2022-06-28 LAB — HEMOGLOBIN AND HEMATOCRIT, BLOOD
HCT: 25.7 % — ABNORMAL LOW (ref 36.0–46.0)
HCT: 27 % — ABNORMAL LOW (ref 36.0–46.0)
Hemoglobin: 8.6 g/dL — ABNORMAL LOW (ref 12.0–15.0)
Hemoglobin: 9 g/dL — ABNORMAL LOW (ref 12.0–15.0)

## 2022-06-28 LAB — BLOOD PRODUCT ORDER (VERBAL) VERIFICATION

## 2022-06-28 LAB — LACTIC ACID, PLASMA: Lactic Acid, Venous: 2.1 mmol/L (ref 0.5–1.9)

## 2022-06-28 LAB — HAPTOGLOBIN: Haptoglobin: 140 mg/dL (ref 42–346)

## 2022-06-28 LAB — GLUCOSE, CAPILLARY: Glucose-Capillary: 140 mg/dL — ABNORMAL HIGH (ref 70–99)

## 2022-06-28 LAB — PHOSPHORUS: Phosphorus: 1.8 mg/dL — ABNORMAL LOW (ref 2.5–4.6)

## 2022-06-28 MED ORDER — SIMETHICONE 80 MG PO CHEW
80.0000 mg | CHEWABLE_TABLET | Freq: Four times a day (QID) | ORAL | Status: DC
  Administered 2022-06-28 – 2022-07-02 (×17): 80 mg via ORAL
  Filled 2022-06-28 (×17): qty 1

## 2022-06-28 MED ORDER — QUETIAPINE FUMARATE 25 MG PO TABS
50.0000 mg | ORAL_TABLET | Freq: Every day | ORAL | Status: DC
  Administered 2022-06-28 – 2022-07-02 (×5): 50 mg via ORAL
  Filled 2022-06-28 (×5): qty 2

## 2022-06-28 MED ORDER — DULOXETINE HCL 60 MG PO CPEP
60.0000 mg | ORAL_CAPSULE | Freq: Every day | ORAL | Status: DC
  Administered 2022-06-28 – 2022-07-02 (×5): 60 mg via ORAL
  Filled 2022-06-28 (×5): qty 1

## 2022-06-28 MED ORDER — HYDROCORTISONE SOD SUC (PF) 100 MG IJ SOLR
50.0000 mg | Freq: Two times a day (BID) | INTRAMUSCULAR | Status: DC
  Administered 2022-06-28 – 2022-06-30 (×5): 50 mg via INTRAVENOUS
  Filled 2022-06-28 (×6): qty 1

## 2022-06-28 MED ORDER — SENNOSIDES-DOCUSATE SODIUM 8.6-50 MG PO TABS: 1.0000 | ORAL_TABLET | Freq: Two times a day (BID) | ORAL | Status: DC | PRN

## 2022-06-28 MED ORDER — MIRABEGRON ER 25 MG PO TB24
50.0000 mg | ORAL_TABLET | Freq: Every day | ORAL | Status: DC
  Administered 2022-06-28 – 2022-07-02 (×5): 50 mg via ORAL
  Filled 2022-06-28 (×6): qty 2

## 2022-06-28 MED ORDER — PREGABALIN 50 MG PO CAPS
50.0000 mg | ORAL_CAPSULE | Freq: Two times a day (BID) | ORAL | Status: DC
  Administered 2022-06-28 – 2022-07-03 (×11): 50 mg via ORAL
  Filled 2022-06-28 (×9): qty 1
  Filled 2022-06-28: qty 2
  Filled 2022-06-28: qty 1

## 2022-06-28 MED ORDER — POTASSIUM CHLORIDE CRYS ER 20 MEQ PO TBCR
40.0000 meq | EXTENDED_RELEASE_TABLET | Freq: Once | ORAL | Status: AC
  Administered 2022-06-28: 40 meq via ORAL
  Filled 2022-06-28: qty 2

## 2022-06-28 MED ORDER — POTASSIUM CHLORIDE CRYS ER 20 MEQ PO TBCR: 60.0000 meq | EXTENDED_RELEASE_TABLET | Freq: Once | ORAL | Status: DC

## 2022-06-28 MED ORDER — POLYETHYLENE GLYCOL 3350 17 G PO PACK: 17.0000 g | PACK | Freq: Two times a day (BID) | ORAL | Status: DC | PRN

## 2022-06-28 MED ORDER — ONDANSETRON HCL 4 MG/2ML IJ SOLN
4.0000 mg | Freq: Three times a day (TID) | INTRAMUSCULAR | Status: DC | PRN
  Administered 2022-06-28: 4 mg via INTRAVENOUS
  Filled 2022-06-28: qty 2

## 2022-06-28 MED ORDER — POTASSIUM PHOSPHATES 15 MMOLE/5ML IV SOLN
30.0000 mmol | Freq: Once | INTRAVENOUS | Status: AC
  Administered 2022-06-28: 30 mmol via INTRAVENOUS
  Filled 2022-06-28: qty 10

## 2022-06-28 MED ORDER — DIAZEPAM 5 MG PO TABS
5.0000 mg | ORAL_TABLET | Freq: Two times a day (BID) | ORAL | Status: DC | PRN
  Administered 2022-06-28 – 2022-07-02 (×4): 5 mg via ORAL
  Filled 2022-06-28 (×4): qty 1

## 2022-06-28 NOTE — Progress Notes (Addendum)
PROGRESS NOTE  Katelyn Lamb MGQ:[id number] DOB: 08-12-1946   PCP: [name], MD  Patient is from: Home.  Lives with husband.  Uses walker at baseline.  DOA: 06/26/2022 LOS: 2  Chief complaints Chief Complaint  Patient presents with   Hematuria     Brief Narrative / Interim history: 76 year old F with PMH of recurrent DVT/PE on Eliquis, gait dysfunction, neurogenic bladder with suprapubic cath, seizure disorder, herpes encephalitis, chronic steroid use and ESBL UTI presenting with bleeding from suprapubic catheter for 1 day with associated burning pain, and she was admitted for acute blood loss anemia due to gross hematuria and severe sepsis due to complicated UTI.  Hemoglobin dropped from 14-6.7.  Received Kcentra, and started on antibiotics and stress dose steroid.  Urology consulted.  Continuous bladder irrigation started.  Initial blood transfusion discontinued due to concern for incompatibility.  She received 1 unit the next day with appropriate response.  H&H seems to be stable.  Weaning off stress dose steroid.  Urology following.  Subjective: Seen and examined earlier this morning.  She had an episode of anxiety, agitation and increased tremor in the setting of abdominal discomfort from constipation and hemorrhoidal pain.  Her Valium and Lyrica were on hold.  Her symptoms improved after having bowel movement.  Feels much better this morning.  Patient's husband at bedside.  Objective: Vitals:   06/28/22 1000 06/28/22 1100 06/28/22 1200 06/28/22 1700  BP: 132/73 108/60    Pulse: (!) 107 (!) 110 (!) 103 (!) 105  Resp: (!) 22 18 (!) 21 19  Temp:   98.7 F (37.1 C)   TempSrc:   Oral   SpO2: 96% 93% 93%   Weight:      Height:        Examination:  GENERAL: No apparent distress.  Nontoxic. HEENT: MMM.  Vision and hearing grossly intact.  NECK: Supple.  No apparent JVD.  RESP:  No IWOB.  Fair aeration bilaterally. CVS:  RRR. Heart sounds normal.  ABD/GI/GU: BS+. Abd soft,  NTND.  Suprapubic catheter.  No urine in Foley bag.  Just emptied.  MSK/EXT:  Moves extremities. No apparent deformity. No edema.  SKIN: no apparent skin lesion or wound NEURO: Awake and alert. Oriented appropriately.  No apparent focal neuro deficit other than some tremors.Marland Kitchen PSYCH: Calm. Normal affect.   Procedures:  Continuous bladder irrigation  Microbiology summarized: Urine culture NGTD Blood culture NGTD MRSA PCR screen nonreactive.  Assessment and plan: Principal Problem:   Hematuria due to acute cystitis Active Problems:   Severe sepsis with septic shock (HCC)   Hypotension   Neurogenic bladder   Chronic suprapubic catheter (HCC)   History of pulmonary embolism   History of DVT (deep vein thrombosis)   Incomplete paraplegia (HCC)   Chronic pain syndrome   History of herpes encephalitis   Current chronic use of systemic steroids   DNR (do not resuscitate)   History of ESBL E. coli infection   Seizure disorder ([place])   Gross hematuria   Hyperglycemia   Complicated UTI (urinary tract infection)   Bandemia   Hemorrhagic shock ([place])  Hemorrhagic shock due to gross hematuria in patient with suprapubic catheter: Received 1 unit with appropriate response.  Bleeding seems to have subsided.  Hemolysis labs negative. Recent Labs    06/26/22 1101 06/26/22 2250 06/27/22 0521 06/27/22 0817 06/27/22 1058 06/27/22 1811 06/27/22 2012 06/28/22 0048 06/28/22 0351 06/28/22 0749  HGB 11.8* 9.5* 7.8* 6.9* 6.7* 8.2* 9.3* 9.0* 8.3* 8.6*  -  Appreciate help by oncology and urology -Continue IV ceftriaxone for total of 5 days -Recheck CBC in the morning -Transfuse for Hgb <7.0.  Severe sepsis with septic shock due to UTI from suprapubic catheter: Had SIRS, lactic acidosis and hypotension although hypotension could be also be hemorrhagic.  Hypotension resolved but significant leukocytosis.  Steroid could contribute to leukocytosis. -Meropenem>> ceftriaxone for a total of 5  days -Monitor leukocytosis -Wean off stress dose steroid  Lactic acidosis: Resolving.  Hemodynamically stable now.   Neurogenic bladder with chronic indwelling suprapubic catheter: Complication of HSV encephalitis. -Urology on board   History of recurrent DVT/PE on chronic anticoagulation -Hold anticoagulation in the setting of bleeding   History of HSV encephalitis/chronic gait disorder/chronic pain syndrome and chronic steroid use -DNR/DNI. -Palliative following.  Anxiety and tremor -Resumed home Lyrica and Valium.  Constipation -MiraLAX and Senokot-S as needed  Bandemia: Could be due to sepsis or steroid. -Continue monitoring  Hyperglycemia: Likely due to steroid.  A1c 5.2%. -Continue CBG monitoring and SSI while on stress dose steroid  Hypokalemia/hypophosphatemia -Monitor and replenish as appropriate  History of hemorrhoids -Continue Tucks  Body mass index is 29.39 kg/m.    DVT prophylaxis:  Place TED hose Start: 06/26/22 0950  Code Status: DNR/DNI Family Communication: Updated patient's husband at bedside Level of care: Progressive Status is: Inpatient Remains inpatient appropriate because: Gross hematuria, severe sepsis in the setting of complicated UTI   Final disposition: TBD Consultants:  Urology Oncology/hematology Palliative medicine  Sch Meds:  Scheduled Meds:  sodium chloride   Intravenous Once   acyclovir  400 mg Oral BID   Chlorhexidine Gluconate Cloth  6 each Topical Daily   DULoxetine  60 mg Oral QHS   hydrocortisone sod succinate (SOLU-CORTEF) inj  50 mg Intravenous Q12H   insulin aspart  0-9 Units Subcutaneous TID WC   mirabegron ER  50 mg Oral QHS   pantoprazole  40 mg Oral QHS   pregabalin  50 mg Oral BID   QUEtiapine  50 mg Oral QHS   simethicone  80 mg Oral QID   sodium chloride flush  3 mL Intravenous Q12H   Continuous Infusions:  sodium chloride Stopped (06/26/22 1059)   cefTRIAXone (ROCEPHIN)  IV Stopped (06/27/22  2146)   sodium chloride irrigation     PRN Meds:.acetaminophen **OR** acetaminophen, albuterol, diazepam, diphenhydrAMINE, HYDROcodone-acetaminophen, ondansetron (ZOFRAN) IV, polyethylene glycol, senna-docusate, witch hazel-glycerin  Antimicrobials: Anti-infectives (From admission, onward)    Start     Dose/Rate Route Frequency Ordered Stop   06/27/22 2200  cefTRIAXone (ROCEPHIN) 2 g in sodium chloride 0.9 % 100 mL IVPB        2 g 200 mL/hr over 30 Minutes Intravenous Every 24 hours 06/27/22 1356     06/27/22 1600  acyclovir (ZOVIRAX) tablet 400 mg        400 mg Oral 2 times daily 06/27/22 1357     06/26/22 1400  meropenem (MERREM) 1 g in sodium chloride 0.9 % 100 mL IVPB  Status:  Discontinued        1 g 200 mL/hr over 30 Minutes Intravenous Every 8 hours 06/26/22 1228 06/27/22 1355   06/26/22 0915  cefTRIAXone (ROCEPHIN) 1 g in sodium chloride 0.9 % 100 mL IVPB        1 g 200 mL/hr over 30 Minutes Intravenous  Once 06/26/22 0913 06/26/22 1143        I have personally reviewed the following labs and images: CBC: Recent Labs  Lab 06/26/22 0443  06/26/22 0820 06/26/22 1101 06/27/22 0521 06/27/22 0817 06/27/22 1811 06/27/22 2012 06/28/22 0048 06/28/22 0351 06/28/22 0749  WBC 15.0* 18.7*  --  26.0*  --   --   --   --  27.6*  --   NEUTROABS  --  10.7*  --   --   --   --   --   --  21.2*  --   HGB 14.0 11.6*   < > 7.8*   < > 8.2* 9.3* 9.0* 8.3* 8.6*  HCT 45.2 37.9   < > 25.4*   < > 25.1* 28.1* 27.0* 24.7* 25.7*  MCV 104.1* 105.9*  --  106.3*  --   --   --   --  94.3  --   PLT 303 310  --  208  --   --   --   --  199  --    < > = values in this interval not displayed.   BMP &GFR Recent Labs  Lab 06/26/22 0443 06/27/22 0255 06/28/22 0351  NA 141 137 140  K 4.6 4.8 3.4*  CL 101 107 111  CO2 25 20* 22  GLUCOSE 112* 144* 125*  BUN 24* 27* 24*  CREATININE 0.79 0.95 0.93  CALCIUM 9.0 7.5* 7.9*  MG  --   --  1.9  PHOS  --   --  1.8*   Estimated Creatinine Clearance:  55.5 mL/min (by C-G formula based on SCr of 0.93 mg/dL). Liver & Pancreas: Recent Labs  Lab 06/28/22 0351  AST 41  ALT 35  ALKPHOS 58  BILITOT 0.5  PROT 5.4*  ALBUMIN 2.9*   No results for input(s): "LIPASE", "AMYLASE" in the last 168 hours. No results for input(s): "AMMONIA" in the last 168 hours. Diabetic: Recent Labs    06/27/22 1058  HGBA1C 5.2   Recent Labs  Lab 06/27/22 1736 06/27/22 2143 06/28/22 1711  GLUCAP 134* 150* 140*   Cardiac Enzymes: No results for input(s): "CKTOTAL", "CKMB", "CKMBINDEX", "TROPONINI" in the last 168 hours. No results for input(s): "PROBNP" in the last 8760 hours. Coagulation Profile: Recent Labs  Lab 06/26/22 0900 06/28/22 0351  INR 1.5* 1.1   Thyroid Function Tests: No results for input(s): "TSH", "T4TOTAL", "FREET4", "T3FREE", "THYROIDAB" in the last 72 hours. Lipid Profile: No results for input(s): "CHOL", "HDL", "LDLCALC", "TRIG", "CHOLHDL", "LDLDIRECT" in the last 72 hours. Anemia Panel: No results for input(s): "VITAMINB12", "FOLATE", "FERRITIN", "TIBC", "IRON", "RETICCTPCT" in the last 72 hours. Urine analysis:    Component Value Date/Time   COLORURINE RED (A) 06/26/2022 0900   APPEARANCEUR TURBID (A) 06/26/2022 0900   LABSPEC  06/26/2022 0900    TEST NOT REPORTED DUE TO COLOR INTERFERENCE OF URINE PIGMENT   PHURINE  06/26/2022 0900    TEST NOT REPORTED DUE TO COLOR INTERFERENCE OF URINE PIGMENT   GLUCOSEU (A) 06/26/2022 0900    TEST NOT REPORTED DUE TO COLOR INTERFERENCE OF URINE PIGMENT   HGBUR (A) 06/26/2022 0900    TEST NOT REPORTED DUE TO COLOR INTERFERENCE OF URINE PIGMENT   BILIRUBINUR (A) 06/26/2022 0900    TEST NOT REPORTED DUE TO COLOR INTERFERENCE OF URINE PIGMENT   KETONESUR (A) 06/26/2022 0900    TEST NOT REPORTED DUE TO COLOR INTERFERENCE OF URINE PIGMENT   PROTEINUR (A) 06/26/2022 0900    TEST NOT REPORTED DUE TO COLOR INTERFERENCE OF URINE PIGMENT   NITRITE (A) 06/26/2022 0900    TEST NOT REPORTED  DUE TO COLOR INTERFERENCE OF URINE  PIGMENT   LEUKOCYTESUR (A) 06/26/2022 0900    TEST NOT REPORTED DUE TO COLOR INTERFERENCE OF URINE PIGMENT   Sepsis Labs: Invalid input(s): "PROCALCITONIN", "LACTICIDVEN"  Microbiology: Recent Results (from the past 240 hour(s))  Blood culture (routine x 2)     Status: None (Preliminary result)   Collection Time: 06/26/22  8:33 AM   Specimen: BLOOD RIGHT HAND  Result Value Ref Range Status   Specimen Description BLOOD RIGHT HAND  Final   Special Requests   Final    BOTTLES DRAWN AEROBIC ONLY Blood Culture results may not be optimal due to an inadequate volume of blood received in culture bottles   Culture   Final    NO GROWTH 2 DAYS Performed at [facility], [place] [address], [place], [place] [address]    Report Status PENDING  Incomplete  Urine Culture     Status: None   Collection Time: 06/26/22  9:13 AM   Specimen: Urine, Catheterized  Result Value Ref Range Status   Specimen Description URINE, CATHETERIZED  Final   Special Requests NONE  Final   Culture   Final    NO GROWTH Performed at [facility] [address], [place], [place] [address]    Report Status 06/27/2022 FINAL  Final  MRSA Next Gen by PCR, Nasal     Status: None   Collection Time: 06/26/22  7:02 PM   Specimen: Nasal Mucosa; Nasal Swab  Result Value Ref Range Status   MRSA by PCR Next Gen NOT DETECTED NOT DETECTED Final    Comment: (NOTE) The GeneXpert MRSA Assay (FDA approved for NASAL specimens only), is one component of a comprehensive MRSA colonization surveillance program. It is not intended to diagnose MRSA infection nor to guide or monitor treatment for MRSA infections. Test performance is not FDA approved in patients less than 28 years old. Performed at [facility], [place] [address], [place], [place] [address]   Blood culture (routine x 2)     Status: None (Preliminary result)   Collection Time: 06/26/22 10:50 PM   Specimen:  BLOOD  Result Value Ref Range Status   Specimen Description   Final    BLOOD BLOOD LEFT FOREARM Performed at [facility] [address] [place], [place] [address]    Special Requests   Final    BOTTLES DRAWN AEROBIC ONLY Blood Culture adequate volume Performed at [facility] [address] [place], [place] [address]    Culture   Final    NO GROWTH 1 DAY Performed at [facility], [place] [address] [place], [place] [address]    Report Status PENDING  Incomplete    Radiology Studies: No results found.    Angelise Petrich T. [place]  If 7PM-7AM, please contact night-coverage [email or web address] 06/28/2022, 8:59 PM

## 2022-06-28 NOTE — Progress Notes (Signed)
Patient significantly calmer after having a bowel movement.however still had a fine tremor called out after she dropped her phone and glasses while turning in bed.Phone still intact but glasses broken. Pieces collected and placed at bedside.

## 2022-06-28 NOTE — Progress Notes (Signed)
Patient has been increasingly agitated since shift change with noted anxiety about her inability to have a bowel movement and hemorrhoids and was also noted to have a tremor in upper extremities that she and her husband reported she had been taking valium twice a day for at home.NP made aware and and orders put in to address both issues. Husband updated of the patients progress and said he would be by to see her in the morning.

## 2022-06-28 NOTE — Progress Notes (Signed)
Subjective: No acute events overnight. Katelyn Lamb had some loose stools overnight and endorses some rectal discomfort this morning. Her catheter did not have to be flushed overnight.  She was transfused at least one unit yesterday evening. Blood pressures appear improved.  Urine clear pink on low CBI this morning  Objective: Vital signs in last 24 hours: Temp:  [97.4 F (36.3 C)-98.3 F (36.8 C)] 98.3 F (36.8 C) (10/26 0400) Pulse Rate:  [46-181] 109 (10/26 0600) Resp:  [13-32] 26 (10/26 0600) BP: (94-195)/(50-145) 102/65 (10/26 0600) SpO2:  [45 %-100 %] 99 % (10/26 0600)  Intake/Output from previous day: 10/25 0701 - 10/26 0700 In: 5366.4 [I.V.:469.7; Blood:394.6; IV Piggyback:102.2] Out: 6300 [Urine:6300] Intake/Output this shift: Total I/O In: 2400 [Other:2300; IV Piggyback:100] Out: 2900 [Urine:2900]  Physical Exam:  General: Alert and oriented CV: RRR Lungs: Clear Abdomen: Soft, ND, ATTP Ext: NT, No erythema Urine clear on low rate CBI  Lab Results: Recent Labs    06/27/22 2012 06/28/22 0048 06/28/22 0351  HGB 9.3* 9.0* 8.3*  HCT 28.1* 27.0* 24.7*    BMET Recent Labs    06/27/22 0255 06/28/22 0351  NA 137 140  K 4.8 3.4*  CL 107 111  CO2 20* 22  GLUCOSE 144* 125*  BUN 27* 24*  CREATININE 0.95 0.93  CALCIUM 7.5* 7.9*      Studies/Results: VAS [place] LOWER EXTREMITY VENOUS (DVT)  Result Date: 06/27/2022  Lower Venous DVT Study Patient Name:  Katelyn Lamb  Date of Exam:   06/27/2022 Medical Rec #: [id number]     Accession #:    [id number] Date of Birth: Aug 24, 1946      Patient Gender: F Patient Age:   76 years Exam Location:  [facility] Procedure:      VAS [place] LOWER EXTREMITY VENOUS (DVT) Referring Phys: Fuller Plan --------------------------------------------------------------------------------  Indications: Pulmonary embolism.  Limitations: Body habitus, poor ultrasound/tissue interface and patient position, muscle contracture. Comparison  Study: 08/07/2021 Bilateral lower extremity venous duplex- acute DVT                   involving right peroneal veins, no evidence of left lower                   extremity DVT. Performing Technologist: Maudry Mayhew MHA, RDMS, RVT, RDCS  Examination Guidelines: A complete evaluation includes B-mode imaging, spectral Doppler, color Doppler, and power Doppler as needed of all accessible portions of each vessel. Bilateral testing is considered an integral part of a complete examination. Limited examinations for reoccurring indications may be performed as noted. The reflux portion of the exam is performed with the patient in reverse Trendelenburg.  +---------+---------------+---------+-----------+---------------+--------------+ RIGHT    CompressibilityPhasicitySpontaneityProperties     Thrombus Aging +---------+---------------+---------+-----------+---------------+--------------+ CFV      Full           Yes      Yes                                      +---------+---------------+---------+-----------+---------------+--------------+ SFJ      Full                                                             +---------+---------------+---------+-----------+---------------+--------------+  FV Prox  Full                                                             +---------+---------------+---------+-----------+---------------+--------------+ FV Mid   Full                                                             +---------+---------------+---------+-----------+---------------+--------------+ FV DistalFull                                                             +---------+---------------+---------+-----------+---------------+--------------+ PFV      Full                                                             +---------+---------------+---------+-----------+---------------+--------------+ POP      Full           Yes      Yes                                       +---------+---------------+---------+-----------+---------------+--------------+ PTV                              Yes        patent by color                                                           Doppler                       +---------+---------------+---------+-----------+---------------+--------------+   Right Technical Findings: Not visualized segments include limited visualization peroneal veins.  +---------+---------------+---------+-----------+----------+--------------+ LEFT     CompressibilityPhasicitySpontaneityPropertiesThrombus Aging +---------+---------------+---------+-----------+----------+--------------+ CFV      Full           Yes      Yes                                 +---------+---------------+---------+-----------+----------+--------------+ SFJ      Full                                                        +---------+---------------+---------+-----------+----------+--------------+ FV Prox  Full                                                        +---------+---------------+---------+-----------+----------+--------------+  FV Mid   Full                                                        +---------+---------------+---------+-----------+----------+--------------+ FV DistalFull                                                        +---------+---------------+---------+-----------+----------+--------------+ PFV      Full                                                        +---------+---------------+---------+-----------+----------+--------------+ POP      Full           Yes      Yes                                 +---------+---------------+---------+-----------+----------+--------------+   Left Technical Findings: Not visualized segments include PTV, peroneal veins.   Summary: RIGHT: - There is no evidence of deep vein thrombosis in the lower extremity. However, portions of this examination were limited- see  technologist comments above.  - No cystic structure found in the popliteal fossa.  LEFT: - There is no evidence of deep vein thrombosis in the lower extremity. However, portions of this examination were limited- see technologist comments above.  - No cystic structure found in the popliteal fossa.  *See table(s) above for measurements and observations. Electronically signed by Harold Barban MD on 06/27/2022 at 10:35:15 PM.    Final    CT ABDOMEN PELVIS WO CONTRAST  Result Date: 06/26/2022 CLINICAL DATA:  Gross hematuria, has suprapubic catheter, had red streaks and collection bag yesterday, today frank red urine, on Eliquis, golf ball sized clots EXAM: CT ABDOMEN AND PELVIS WITHOUT CONTRAST TECHNIQUE: Multidetector CT imaging of the abdomen and pelvis was performed following the standard protocol without IV contrast. RADIATION DOSE REDUCTION: This exam was performed according to the departmental dose-optimization program which includes automated exposure control, adjustment of the mA and/or kV according to patient size and/or use of iterative reconstruction technique. COMPARISON:  08/07/2021 FINDINGS: Lower chest: Lung bases clear Hepatobiliary: Gallbladder and liver normal appearance. No biliary dilatation. Pancreas: Normal appearance Spleen: Normal appearance Adrenals/Urinary Tract: Adrenal glands normal appearance. Tiny nonobstructing calculus at inferior pole LEFT kidney. Kidneys otherwise normal appearance without mass or hydronephrosis. Slightly increased attenuation of the decompressed RIGHT renal pelvis and proximal RIGHT ureter, cannot exclude blood. LEFT ureter is low-attenuation. Foley catheter and suprapubic catheter balloons within urinary bladder. Significant bladder wall thickening. High attenuation material within bladder lumen consistent with blood. Minimal air within bladder lumen. No focal bladder mass identified though a bladder mass could be obscured by the blood and catheters.  Stomach/Bowel: Minimal distal colonic diverticulosis without evidence of diverticulitis. Stool RIGHT colon. Appendix normal caliber with high attenuation material versus appendicolith in distal lumen. Stomach and remaining bowel loops normal appearance. Vascular/Lymphatic: Atherosclerotic calcifications aorta and iliac arteries without aneurysm Reproductive: Uterus surgically absent.  Nonvisualization of ovaries. Other: Tiny umbilical hernia containing fat. No free air or free fluid. No inflammatory process. Musculoskeletal: Osseous demineralization. Old inferior LEFT pubic ramus fracture deformity. Prior L5 and L2 spinal augmentation procedures. Superior endplate height losses of multiple thoracic and lumbar vertebra. IMPRESSION: Significant bladder wall thickening with high attenuation material within bladder lumen consistent with blood; no definite bladder mass identified though a bladder mass could be obscured by the blood and catheters. Slightly increased attenuation of the decompressed RIGHT renal pelvis and proximal RIGHT ureter, question hematuria. Tiny nonobstructing LEFT renal calculus. Minimal distal colonic diverticulosis without evidence of diverticulitis. Tiny umbilical hernia containing fat. Aortic Atherosclerosis (ICD10-I70.0). Electronically Signed   By: Lavonia Dana M.D.   On: 06/26/2022 09:41    Assessment/Plan: Katelyn Lamb is a 76 yo F with hematuria, blood loss anemia. Transfused PRBCs yesterday evening. Urine appearance is improved today  --transfuse PRN --Urine continues to improve with conservative measures. Based on the trend of her urine I believe she will likely not need any sort of operative intervention this hospital stay, but will continue to follow closely --Ok to have diet today, NPO at midnight just in case    LOS: 2 days   Donald Pore MD 06/28/2022, 6:53 AM [facility]  Pager: [phone]

## 2022-06-28 NOTE — Plan of Care (Signed)

## 2022-06-29 DIAGNOSIS — A419 Sepsis, unspecified organism: Secondary | ICD-10-CM | POA: Diagnosis not present

## 2022-06-29 DIAGNOSIS — N390 Urinary tract infection, site not specified: Secondary | ICD-10-CM | POA: Diagnosis not present

## 2022-06-29 DIAGNOSIS — N3001 Acute cystitis with hematuria: Secondary | ICD-10-CM | POA: Diagnosis not present

## 2022-06-29 DIAGNOSIS — R6521 Severe sepsis with septic shock: Secondary | ICD-10-CM

## 2022-06-29 DIAGNOSIS — I959 Hypotension, unspecified: Secondary | ICD-10-CM | POA: Diagnosis not present

## 2022-06-29 DIAGNOSIS — R578 Other shock: Secondary | ICD-10-CM

## 2022-06-29 DIAGNOSIS — R0902 Hypoxemia: Secondary | ICD-10-CM

## 2022-06-29 LAB — IRON AND TIBC
Iron: 15 ug/dL — ABNORMAL LOW (ref 28–170)
Saturation Ratios: 4 % — ABNORMAL LOW (ref 10.4–31.8)
TIBC: 341 ug/dL (ref 250–450)
UIBC: 326 ug/dL

## 2022-06-29 LAB — CBC WITH DIFFERENTIAL/PLATELET
Abs Immature Granulocytes: 0.14 10*3/uL — ABNORMAL HIGH (ref 0.00–0.07)
Basophils Absolute: 0 10*3/uL (ref 0.0–0.1)
Basophils Relative: 0 %
Eosinophils Absolute: 0 10*3/uL (ref 0.0–0.5)
Eosinophils Relative: 0 %
HCT: 22.2 % — ABNORMAL LOW (ref 36.0–46.0)
Hemoglobin: 6.9 g/dL — CL (ref 12.0–15.0)
Immature Granulocytes: 1 %
Lymphocytes Relative: 12 %
Lymphs Abs: 1.8 10*3/uL (ref 0.7–4.0)
MCH: 31.5 pg (ref 26.0–34.0)
MCHC: 31.1 g/dL (ref 30.0–36.0)
MCV: 101.4 fL — ABNORMAL HIGH (ref 80.0–100.0)
Monocytes Absolute: 0.7 10*3/uL (ref 0.1–1.0)
Monocytes Relative: 5 %
Neutro Abs: 11.8 10*3/uL — ABNORMAL HIGH (ref 1.7–7.7)
Neutrophils Relative %: 82 %
Platelets: 177 10*3/uL (ref 150–400)
RBC: 2.19 MIL/uL — ABNORMAL LOW (ref 3.87–5.11)
RDW: 17.3 % — ABNORMAL HIGH (ref 11.5–15.5)
WBC: 14.4 10*3/uL — ABNORMAL HIGH (ref 4.0–10.5)
nRBC: 0.6 % — ABNORMAL HIGH (ref 0.0–0.2)

## 2022-06-29 LAB — GLUCOSE, CAPILLARY
Glucose-Capillary: 117 mg/dL — ABNORMAL HIGH (ref 70–99)
Glucose-Capillary: 133 mg/dL — ABNORMAL HIGH (ref 70–99)
Glucose-Capillary: 104 mg/dL — ABNORMAL HIGH (ref 70–99)
Glucose-Capillary: 91 mg/dL (ref 70–99)
Glucose-Capillary: 111 mg/dL — ABNORMAL HIGH (ref 70–99)
Glucose-Capillary: 97 mg/dL (ref 70–99)

## 2022-06-29 LAB — CBC
HCT: 25.3 % — ABNORMAL LOW (ref 36.0–46.0)
HCT: 24.6 % — ABNORMAL LOW (ref 36.0–46.0)
Hemoglobin: 8.1 g/dL — ABNORMAL LOW (ref 12.0–15.0)
Hemoglobin: 8 g/dL — ABNORMAL LOW (ref 12.0–15.0)
MCH: 31.4 pg (ref 26.0–34.0)
MCH: 32 pg (ref 26.0–34.0)
MCHC: 32 g/dL (ref 30.0–36.0)
MCHC: 32.5 g/dL (ref 30.0–36.0)
MCV: 98.1 fL (ref 80.0–100.0)
MCV: 98.4 fL (ref 80.0–100.0)
Platelets: 156 10*3/uL (ref 150–400)
Platelets: 161 10*3/uL (ref 150–400)
RBC: 2.58 MIL/uL — ABNORMAL LOW (ref 3.87–5.11)
RBC: 2.5 MIL/uL — ABNORMAL LOW (ref 3.87–5.11)
RDW: 17.7 % — ABNORMAL HIGH (ref 11.5–15.5)
RDW: 17.8 % — ABNORMAL HIGH (ref 11.5–15.5)
WBC: 13.1 10*3/uL — ABNORMAL HIGH (ref 4.0–10.5)
WBC: 15.4 10*3/uL — ABNORMAL HIGH (ref 4.0–10.5)
nRBC: 0.5 % — ABNORMAL HIGH (ref 0.0–0.2)
nRBC: 0.6 % — ABNORMAL HIGH (ref 0.0–0.2)

## 2022-06-29 LAB — VITAMIN B12: Vitamin B-12: 1261 pg/mL — ABNORMAL HIGH (ref 180–914)

## 2022-06-29 LAB — RENAL FUNCTION PANEL
Albumin: 2.7 g/dL — ABNORMAL LOW (ref 3.5–5.0)
Anion gap: 3 — ABNORMAL LOW (ref 5–15)
BUN: 22 mg/dL (ref 8–23)
CO2: 24 mmol/L (ref 22–32)
Calcium: 7.6 mg/dL — ABNORMAL LOW (ref 8.9–10.3)
Chloride: 114 mmol/L — ABNORMAL HIGH (ref 98–111)
Creatinine, Ser: 1.02 mg/dL — ABNORMAL HIGH (ref 0.44–1.00)
GFR, Estimated: 58 mL/min — ABNORMAL LOW (ref >60)
Glucose, Bld: 133 mg/dL — ABNORMAL HIGH (ref 70–99)
Phosphorus: 4.7 mg/dL — ABNORMAL HIGH (ref 2.5–4.6)
Potassium: 4.9 mmol/L (ref 3.5–5.1)
Sodium: 141 mmol/L (ref 135–145)

## 2022-06-29 LAB — FOLATE: Folate: 17.6 ng/mL (ref >5.9)

## 2022-06-29 LAB — FERRITIN: Ferritin: 23 ng/mL (ref 11–307)

## 2022-06-29 LAB — RETICULOCYTES
Immature Retic Fract: 37.2 % — ABNORMAL HIGH (ref 2.3–15.9)
RBC.: 2.15 MIL/uL — ABNORMAL LOW (ref 3.87–5.11)
Retic Count, Absolute: 89.7 10*3/uL (ref 19.0–186.0)
Retic Ct Pct: 4.2 % — ABNORMAL HIGH (ref 0.4–3.1)

## 2022-06-29 LAB — LACTIC ACID, PLASMA: Lactic Acid, Venous: 0.8 mmol/L (ref 0.5–1.9)

## 2022-06-29 LAB — PREPARE RBC (CROSSMATCH)

## 2022-06-29 LAB — MAGNESIUM: Magnesium: 1.6 mg/dL — ABNORMAL LOW (ref 1.7–2.4)

## 2022-06-29 MED ORDER — MIDODRINE HCL 5 MG PO TABS
5.0000 mg | ORAL_TABLET | Freq: Two times a day (BID) | ORAL | Status: DC
  Administered 2022-06-29 – 2022-07-03 (×8): 5 mg via ORAL
  Filled 2022-06-29 (×8): qty 1

## 2022-06-29 MED ORDER — SODIUM CHLORIDE 0.9% IV SOLUTION: Freq: Once | INTRAVENOUS | Status: AC

## 2022-06-29 MED ORDER — SODIUM CHLORIDE 0.9 % IV SOLN
250.0000 mg | Freq: Every day | INTRAVENOUS | Status: AC
  Administered 2022-06-29 – 2022-06-30 (×2): 250 mg via INTRAVENOUS
  Filled 2022-06-29: qty 20
  Filled 2022-06-29: qty 15

## 2022-06-29 NOTE — Progress Notes (Signed)
   06/29/22 0600  Provider Notification  Provider Name/Title Raenette Rover, NP  Date Provider Notified 06/29/22  Time Provider Notified [phone]  Method of Notification Page  Notification Reason Critical result  Test performed and critical result HGB = 6.9  Date Critical Result Received 06/29/22  Time Critical Result Received 0548  Provider response See new orders  Date of Provider Response 06/29/22  Time of Provider Response [phone]

## 2022-06-29 NOTE — Progress Notes (Addendum)
Pt lethargic and confuse this morning. Pt A/O x 2( person and place)  and unable to sign blood consent form. CN Lauren notified. Will continue to monitor pt.

## 2022-06-29 NOTE — Progress Notes (Signed)
PROGRESS NOTE  LEMPI EDWIN DJM:[id number] DOB: 20-Sep-1945   PCP: [name], MD  Patient is from: Home.  Lives with husband.  Uses walker at baseline.  DOA: 06/26/2022 LOS: 3  Chief complaints Chief Complaint  Patient presents with   Hematuria     Brief Narrative / Interim history: 76 year old F with PMH of recurrent DVT/PE on Eliquis, gait dysfunction, neurogenic bladder with suprapubic cath, seizure disorder, herpes encephalitis, chronic steroid use and ESBL UTI presenting with bleeding from suprapubic catheter for 1 day with associated burning pain, and she was admitted for acute blood loss anemia due to gross hematuria and severe sepsis due to complicated UTI.  Hemoglobin dropped from 14-6.7.  Received Kcentra, and started on antibiotics and stress dose steroid.  Urology consulted.  Continuous bladder irrigation started.  Initial blood transfusion discontinued due to concern for incompatibility.  She received 1 unit the next day with appropriate response.  However, H&H dropped again on 10/27.  Transfuse additional 1 unit.  Weaning off stress dose steroid.  Urology following.  Subjective: Seen and examined earlier this morning.  She was somewhat lethargic and desaturated to 88% overnight started on 2 L.  Hemoglobin dropped to 6.9 this morning.  Was transfused 1 unit.  Hemoglobin 8.1 this afternoon.  Anemia panel with severe iron deficiency.  She has no complaints.  Patient's daughter and husband at bedside.  Objective: Vitals:   06/29/22 0815 06/29/22 0830 06/29/22 1055 06/29/22 1350  BP: (!) 80/58 (!) 87/58 (!) 90/59 103/62  Pulse: (!) 101 99 97 (!) 107  Resp: '17 17 19 18  '$ Temp: 99.1 F (37.3 C) 99.2 F (37.3 C) 98.9 F (37.2 C) 98 F (36.7 C)  TempSrc: Axillary Axillary Axillary Oral  SpO2: 97% 98% 100% 92%  Weight:      Height:        Examination:  GENERAL: No apparent distress.  Nontoxic. HEENT: MMM.  Vision and hearing grossly intact.  NECK: Supple.  No apparent  JVD.  RESP:  No IWOB.  Fair aeration bilaterally. CVS:  RRR. Heart sounds normal.  ABD/GI/GU: BS+. Abd soft, NTND.  Suprapubic catheter.  Dark red urine MSK/EXT:  Moves extremities. No apparent deformity. No edema.  SKIN: no apparent skin lesion or wound NEURO: Awake and alert. Oriented fairly.  No apparent focal neuro deficit. PSYCH: Calm. Normal affect.   Procedures:  Continuous bladder irrigation  Microbiology summarized: Urine culture NGTD Blood culture NGTD MRSA PCR screen nonreactive.  Assessment and plan: Principal Problem:   Hematuria due to acute cystitis Active Problems:   Severe sepsis with septic shock (HCC)   Hypotension   Neurogenic bladder   Chronic suprapubic catheter (HCC)   History of pulmonary embolism   History of DVT (deep vein thrombosis)   Incomplete paraplegia (HCC)   Chronic pain syndrome   History of herpes encephalitis   Current chronic use of systemic steroids   DNR (do not resuscitate)   Hypoxemia   History of ESBL E. coli infection   Seizure disorder ([place])   Gross hematuria   Hyperglycemia   Complicated UTI (urinary tract infection)   Bandemia   Hemorrhagic shock ([place])  Hemorrhagic shock due to gross hematuria in patient with suprapubic catheter: Received 2 units of 4.  Bleeding seems to have subsided.  Hemolysis labs negative.  Anemia panel with severe iron deficiency. Recent Labs    06/27/22 0521 06/27/22 2229 06/27/22 1058 06/27/22 1811 06/27/22 2012 06/28/22 7989 06/28/22 0351 06/28/22 0749 06/29/22 0515 06/29/22  1312  HGB 7.8* 6.9* 6.7* 8.2* 9.3* 9.0* 8.3* 8.6* 6.9* 8.1*  -Appreciate help by oncology and urology -Continue IV ceftriaxone for total of 5 days -IV ferric gluconate 250 mg daily x2 -Recheck CBC in the morning -Transfuse for Hgb <7.0.  Severe sepsis with septic shock due to UTI from suprapubic catheter: Had SIRS, lactic acidosis and hypotension.  Sepsis physiology improving. -Meropenem>> ceftriaxone for a total  of 5 days -Monitor leukocytosis-improving -Wean off stress dose steroid as able -Midodrine 5 mg 3 times daily for BP support  Hypoxemia: Likely due to OSA, Valium and Lyrica.  Now with CPAP. -Wean oxygen as able -We may have to decrease Valium and/or Lyrica  Lactic acidosis: Resolved.   Neurogenic bladder with chronic indwelling suprapubic catheter: Complication of HSV encephalitis. -Urology on board   History of recurrent DVT/PE on chronic anticoagulation -Hold anticoagulation in the setting of bleeding   History of HSV encephalitis/chronic gait disorder/chronic pain syndrome and chronic steroid use -DNR/DNI. -Appreciate input by palliative  Anxiety and tremor -Continue Valium and Lyrica.  May decrease dose  Constipation -MiraLAX and Senokot-S as needed  Bandemia: Could be due to sepsis or steroid.  Improved. -Continue monitoring  Hyperglycemia: Likely due to steroid.  A1c 5.2%. -Continue CBG monitoring and SSI while on stress dose steroid  Hypokalemia/hypophosphatemia/hypomagnesemia -Monitor and replenish as appropriate  History of hemorrhoids -Continue Tucks  Body mass index is 29.39 kg/m.    DVT prophylaxis:  Place TED hose Start: 06/26/22 0950  Code Status: DNR/DNI Family Communication: Updated patient's daughter and husband at bedside Level of care: Progressive Status is: Inpatient Remains inpatient appropriate because: Gross hematuria, severe sepsis in the setting of complicated UTI   Final disposition: TBD Consultants:  Urology Oncology/hematology Palliative medicine  Sch Meds:  Scheduled Meds:  acyclovir  400 mg Oral BID   Chlorhexidine Gluconate Cloth  6 each Topical Daily   DULoxetine  60 mg Oral QHS   hydrocortisone sod succinate (SOLU-CORTEF) inj  50 mg Intravenous Q12H   insulin aspart  0-9 Units Subcutaneous TID WC   midodrine  5 mg Oral BID WC   mirabegron ER  50 mg Oral QHS   pantoprazole  40 mg Oral QHS   pregabalin  50 mg Oral  BID   QUEtiapine  50 mg Oral QHS   simethicone  80 mg Oral QID   sodium chloride flush  3 mL Intravenous Q12H   Continuous Infusions:  cefTRIAXone (ROCEPHIN)  IV 2 g (06/28/22 2200)   ferric gluconate (FERRLECIT) IVPB     sodium chloride irrigation     PRN Meds:.acetaminophen **OR** acetaminophen, albuterol, diazepam, diphenhydrAMINE, HYDROcodone-acetaminophen, ondansetron (ZOFRAN) IV, polyethylene glycol, senna-docusate, witch hazel-glycerin  Antimicrobials: Anti-infectives (From admission, onward)    Start     Dose/Rate Route Frequency Ordered Stop   06/27/22 2200  cefTRIAXone (ROCEPHIN) 2 g in sodium chloride 0.9 % 100 mL IVPB        2 g 200 mL/hr over 30 Minutes Intravenous Every 24 hours 06/27/22 1356 06/30/22 2359   06/27/22 1600  acyclovir (ZOVIRAX) tablet 400 mg        400 mg Oral 2 times daily 06/27/22 1357     06/26/22 1400  meropenem (MERREM) 1 g in sodium chloride 0.9 % 100 mL IVPB  Status:  Discontinued        1 g 200 mL/hr over 30 Minutes Intravenous Every 8 hours 06/26/22 1228 06/27/22 1355   06/26/22 0915  cefTRIAXone (ROCEPHIN) 1 g in sodium  chloride 0.9 % 100 mL IVPB        1 g 200 mL/hr over 30 Minutes Intravenous  Once 06/26/22 0913 06/26/22 1143        I have personally reviewed the following labs and images: CBC: Recent Labs  Lab 06/26/22 0820 06/26/22 1101 06/27/22 0521 06/27/22 0817 06/28/22 0048 06/28/22 0351 06/28/22 0749 06/29/22 0515 06/29/22 1312  WBC 18.7*  --  26.0*  --   --  27.6*  --  14.4* 13.1*  NEUTROABS 10.7*  --   --   --   --  21.2*  --  11.8*  --   HGB 11.6*   < > 7.8*   < > 9.0* 8.3* 8.6* 6.9* 8.1*  HCT 37.9   < > 25.4*   < > 27.0* 24.7* 25.7* 22.2* 25.3*  MCV 105.9*  --  106.3*  --   --  94.3  --  101.4* 98.1  PLT 310  --  208  --   --  199  --  177 156   < > = values in this interval not displayed.   BMP &GFR Recent Labs  Lab 06/26/22 0443 06/27/22 0255 06/28/22 0351 06/29/22 0515  NA 141 137 140 141  K 4.6 4.8  3.4* 4.9  CL 101 107 111 114*  CO2 25 20* 22 24  GLUCOSE 112* 144* 125* 133*  BUN 24* 27* 24* 22  CREATININE 0.79 0.95 0.93 1.02*  CALCIUM 9.0 7.5* 7.9* 7.6*  MG  --   --  1.9 1.6*  PHOS  --   --  1.8* 4.7*   Estimated Creatinine Clearance: 50.6 mL/min (A) (by C-G formula based on SCr of 1.02 mg/dL (H)). Liver & Pancreas: Recent Labs  Lab 06/28/22 0351 06/29/22 0515  AST 41  --   ALT 35  --   ALKPHOS 58  --   BILITOT 0.5  --   PROT 5.4*  --   ALBUMIN 2.9* 2.7*   No results for input(s): "LIPASE", "AMYLASE" in the last 168 hours. No results for input(s): "AMMONIA" in the last 168 hours. Diabetic: Recent Labs    06/27/22 1058  HGBA1C 5.2   Recent Labs  Lab 06/28/22 1217 06/28/22 1711 06/29/22 0840 06/29/22 1204 06/29/22 1635  GLUCAP 133* 140* 104* 91 111*   Cardiac Enzymes: No results for input(s): "CKTOTAL", "CKMB", "CKMBINDEX", "TROPONINI" in the last 168 hours. No results for input(s): "PROBNP" in the last 8760 hours. Coagulation Profile: Recent Labs  Lab 06/26/22 0900 06/28/22 0351  INR 1.5* 1.1   Thyroid Function Tests: No results for input(s): "TSH", "T4TOTAL", "FREET4", "T3FREE", "THYROIDAB" in the last 72 hours. Lipid Profile: No results for input(s): "CHOL", "HDL", "LDLCALC", "TRIG", "CHOLHDL", "LDLDIRECT" in the last 72 hours. Anemia Panel: Recent Labs    06/29/22 0515  VITAMINB12 1,261*  FOLATE 17.6  FERRITIN 23  TIBC 341  IRON 15*  RETICCTPCT 4.2*   Urine analysis:    Component Value Date/Time   COLORURINE RED (A) 06/26/2022 0900   APPEARANCEUR TURBID (A) 06/26/2022 0900   LABSPEC  06/26/2022 0900    TEST NOT REPORTED DUE TO COLOR INTERFERENCE OF URINE PIGMENT   PHURINE  06/26/2022 0900    TEST NOT REPORTED DUE TO COLOR INTERFERENCE OF URINE PIGMENT   GLUCOSEU (A) 06/26/2022 0900    TEST NOT REPORTED DUE TO COLOR INTERFERENCE OF URINE PIGMENT   HGBUR (A) 06/26/2022 0900    TEST NOT REPORTED DUE TO COLOR INTERFERENCE OF URINE  PIGMENT   BILIRUBINUR (A) 06/26/2022 0900    TEST NOT REPORTED DUE TO COLOR INTERFERENCE OF URINE PIGMENT   KETONESUR (A) 06/26/2022 0900    TEST NOT REPORTED DUE TO COLOR INTERFERENCE OF URINE PIGMENT   PROTEINUR (A) 06/26/2022 0900    TEST NOT REPORTED DUE TO COLOR INTERFERENCE OF URINE PIGMENT   NITRITE (A) 06/26/2022 0900    TEST NOT REPORTED DUE TO COLOR INTERFERENCE OF URINE PIGMENT   LEUKOCYTESUR (A) 06/26/2022 0900    TEST NOT REPORTED DUE TO COLOR INTERFERENCE OF URINE PIGMENT   Sepsis Labs: Invalid input(s): "PROCALCITONIN", "LACTICIDVEN"  Microbiology: Recent Results (from the past 240 hour(s))  Blood culture (routine x 2)     Status: None (Preliminary result)   Collection Time: 06/26/22  8:33 AM   Specimen: BLOOD RIGHT HAND  Result Value Ref Range Status   Specimen Description BLOOD RIGHT HAND  Final   Special Requests   Final    BOTTLES DRAWN AEROBIC ONLY Blood Culture results may not be optimal due to an inadequate volume of blood received in culture bottles   Culture   Final    NO GROWTH 3 DAYS Performed at [facility], [place] [address] [place], [place] [address]    Report Status PENDING  Incomplete  Urine Culture     Status: None   Collection Time: 06/26/22  9:13 AM   Specimen: Urine, Catheterized  Result Value Ref Range Status   Specimen Description URINE, CATHETERIZED  Final   Special Requests NONE  Final   Culture   Final    NO GROWTH Performed at [facility] [address], [place], [place] [address]    Report Status 06/27/2022 FINAL  Final  MRSA Next Gen by PCR, Nasal     Status: None   Collection Time: 06/26/22  7:02 PM   Specimen: Nasal Mucosa; Nasal Swab  Result Value Ref Range Status   MRSA by PCR Next Gen NOT DETECTED NOT DETECTED Final    Comment: (NOTE) The GeneXpert MRSA Assay (FDA approved for NASAL specimens only), is one component of a comprehensive MRSA colonization surveillance program. It is not intended to diagnose MRSA  infection nor to guide or monitor treatment for MRSA infections. Test performance is not FDA approved in patients less than 36 years old. Performed at [facility], [place] [address], [place], [place] [address]   Blood culture (routine x 2)     Status: None (Preliminary result)   Collection Time: 06/26/22 10:50 PM   Specimen: BLOOD  Result Value Ref Range Status   Specimen Description   Final    BLOOD BLOOD LEFT FOREARM Performed at [facility] [address], [place], [place] [address]    Special Requests   Final    BOTTLES DRAWN AEROBIC ONLY Blood Culture adequate volume Performed at [facility] [address], [place], [place] [address]    Culture   Final    NO GROWTH 2 DAYS Performed at [facility] [address], [place], [place] [address]    Report Status PENDING  Incomplete    Radiology Studies: No results found.    Graceyn Fodor T. [place]  If 7PM-7AM, please contact night-coverage [email or web address] 06/29/2022, 5:24 PM

## 2022-06-29 NOTE — Progress Notes (Signed)
PT Cancellation Note  Patient Details Name: Katelyn Lamb MRN: [id number] DOB: [birth year]   Cancelled Treatment:    Reason Eval/Treat Not Completed: Other (comment) (hemoglobin 6.9). Per chart review, pt disoriented and needing PRBCs. Hemoglobin outside therapeutic range, will hold PT eval until appropriate.    Talbot Grumbling PT, DPT 06/29/22, 8:49 AM

## 2022-06-29 NOTE — Progress Notes (Signed)
   Subjective/Chief Complaint:  1 - Neurogenic Bladder - chronic SPT at baseline.  2 - Gross Hematuria with Anemia / Retention - significant gross hematurai with clots on admission. ON eliquus for DVT/PE. Continuous bladder irrigation started in SPT, Out foley and Eliquus reversed. UCX 10/24, [place] 10/24 negative. CT 06/26/22 with clot in bladder, no upper tract lesions. CT 08/2021 also w/o overt GU neoplasia.  Today "Katelyn Lamb" is stable and slowly improving. Did require some PRN bladder irrigation by RN today, apprediate, Urine now quite clear. Responded to [place] given yesterday.     Objective: Vital signs in last 24 hours: Temp:  [97.8 F (36.6 C)-99.2 F (37.3 C)] 98 F (36.7 C) (10/27 1350) Pulse Rate:  [97-116] 107 (10/27 1350) Resp:  [17-20] 18 (10/27 1350) BP: (80-103)/(56-69) 103/62 (10/27 1350) SpO2:  [86 %-100 %] 92 % (10/27 1350) Last BM Date : 06/28/22  Intake/Output from previous day: 10/26 0701 - 10/27 0700 In: 8495.1 [P.O.:360; IV Piggyback:515.1] Out: 10750 [Urine:10750] Intake/Output this shift: Total I/O In: 6392.1 [I.V.:60; Blood:332.1; Other:6000] Out: 1600 [Urine:1600]  EXAM: NAD, Stigmata of dementia. Most history form family at bedside Non-labored breathing on RA RRR SNTND SPT in place connected to CBI inflow very slow gtt Urethral catheter in place with very very light pink urine   Lab Results:  Recent Labs    06/29/22 0515 06/29/22 1312  WBC 14.4* 13.1*  HGB 6.9* 8.1*  HCT 22.2* 25.3*  PLT 177 156   BMET Recent Labs    06/28/22 0351 06/29/22 0515  NA 140 141  K 3.4* 4.9  CL 111 114*  CO2 22 24  GLUCOSE 125* 133*  BUN 24* 22  CREATININE 0.93 1.02*  CALCIUM 7.9* 7.6*   PT/INR Recent Labs    06/28/22 0351  LABPROT 14.0  INR 1.1   ABG No results for input(s): "PHART", "HCO3" in the last 72 hours.  Invalid input(s): "PCO2", "PO2"  Studies/Results: No results found.  Anti-infectives: Anti-infectives (From admission,  onward)    Start     Dose/Rate Route Frequency Ordered Stop   06/27/22 2200  cefTRIAXone (ROCEPHIN) 2 g in sodium chloride 0.9 % 100 mL IVPB        2 g 200 mL/hr over 30 Minutes Intravenous Every 24 hours 06/27/22 1356 06/30/22 2359   06/27/22 1600  acyclovir (ZOVIRAX) tablet 400 mg        400 mg Oral 2 times daily 06/27/22 1357     06/26/22 1400  meropenem (MERREM) 1 g in sodium chloride 0.9 % 100 mL IVPB  Status:  Discontinued        1 g 200 mL/hr over 30 Minutes Intravenous Every 8 hours 06/26/22 1228 06/27/22 1355   06/26/22 0915  cefTRIAXone (ROCEPHIN) 1 g in sodium chloride 0.9 % 100 mL IVPB        1 g 200 mL/hr over 30 Minutes Intravenous  Once 06/26/22 0913 06/26/22 1143       Assessment/Plan:  Bladder irrigation titrated to off. Will reassess tomorrow, consdier removal of urethral catheter if remains minimal gross blood. Discussed with NSG ok to restart as needed. Agree with hold eliquus for now as hemodynamically significang bleeding.   Perhaps even consider IVC filer if requires some time off anticaogulation given h/o PE.   Greatly appreciate hospitalist team comangment.   Katelyn Lamb 06/29/2022

## 2022-06-29 NOTE — Progress Notes (Signed)
Dr Tresa Moore in '@bedside'$  and turns CBI off with instructions to leave it off if urine is pink.

## 2022-06-29 NOTE — Progress Notes (Signed)
Attempted to see pt this am. Pt confused and hgb is 6.9.  Will hold on this pt and check back after blood given.   Jinger Neighbors, [place] [phone]

## 2022-06-29 NOTE — TOC Initial Note (Signed)
Transition of Care [facility]) - Initial/Assessment Note    Patient Details  Name: Katelyn Lamb MRN: [id number] Date of Birth: June 09, 1946  [facility]) CM/SW Contact:    Dessa Phi, RN Phone Number: 06/29/2022, 1:53 PM  Clinical Narrative:  Continue to follow for d/c needs.                  Expected Discharge Plan: Home/Self Care Barriers to Discharge: Continued Medical Work up   Patient Goals and CMS Choice Patient states their goals for this hospitalization and ongoing recovery are:: to go home CMS Medicare.gov Compare Post Acute Care list provided to:: Patient    Expected Discharge Plan and Services Expected Discharge Plan: Home/Self Care   Discharge Planning Services: CM Consult   Living arrangements for the past 2 months: Single Family Home                                      Prior Living Arrangements/Services Living arrangements for the past 2 months: Single Family Home Lives with:: Spouse Patient language and need for interpreter reviewed:: Yes Do you feel safe going back to the place where you live?: Yes            Criminal Activity/Legal Involvement Pertinent to Current Situation/Hospitalization: No - Comment as needed  Activities of Daily Living Home Assistive Devices/Equipment: None ADL Screening (condition at time of admission) Patient's cognitive ability adequate to safely complete daily activities?: Yes Is the patient deaf or have difficulty hearing?: No Does the patient have difficulty seeing, even when wearing glasses/contacts?: No Does the patient have difficulty concentrating, remembering, or making decisions?: No Patient able to express need for assistance with ADLs?: Yes Does the patient have difficulty dressing or bathing?: Yes Independently performs ADLs?: No Communication: Independent Dressing (OT): Independent Grooming: Needs assistance Is this a change from baseline?: Pre-admission baseline Feeding:  Independent Bathing: Needs assistance Is this a change from baseline?: Pre-admission baseline Toileting: Needs assistance Is this a change from baseline?: Pre-admission baseline In/Out Bed: Independent Walks in Home: Independent Does the patient have difficulty walking or climbing stairs?: No Weakness of Legs: None Weakness of Arms/Hands: None  Permission Sought/Granted                  Emotional Assessment Appearance:: Appears stated age Attitude/Demeanor/Rapport: Engaged Affect (typically observed): Calm Orientation: : Oriented to Self, Oriented to Place, Oriented to  Time, Oriented to Situation Alcohol / Substance Use: Tobacco Use (tobacco use history of smoking quit years ago.) Psych Involvement: No (comment)  Admission diagnosis:  Obstruction of suprapubic catheter, initial encounter ([place]) [T83.090A] Hematuria due to acute cystitis [N30.01] Hematuria, unspecified type [R31.9] Patient Active Problem List   Diagnosis Date Noted   Hemorrhagic shock ([place]) 06/28/2022   Gross hematuria 06/27/2022   Hyperglycemia 40/98/1191   Complicated UTI (urinary tract infection) 06/27/2022   Bandemia 06/27/2022   Hematuria due to acute cystitis 06/26/2022   History of DVT (deep vein thrombosis) 06/26/2022   Current chronic use of systemic steroids 06/26/2022   Weakness of left upper extremity 09/15/2021   Chronic suprapubic catheter ([place]) 09/15/2021   History of pulmonary embolism 09/15/2021   Abdominal pain 08/07/2021   Elevated troponin 08/07/2021   Lactic acidosis 08/07/2021   Seizure disorder ([place]) 08/07/2021   GERD (gastroesophageal reflux disease) 08/07/2021   Platelet inhibition due to Plavix    Epistaxis s/p embolization 07-18-2021 07/17/2021  Asymptomatic bacteriuria    History of ESBL E. coli infection 07/14/2021   History of herpes encephalitis 07/14/2021   Generalized weakness 03/15/2021   Acute cystitis with hematuria 03/15/2021   Encephalopathy 03/15/2021    Recurrent falls 03/15/2021   Right pulmonary embolus ([place]) 02/12/2020   Lower extremity weakness 02/02/2020   Macrocytic anemia 02/02/2020   Spinal stenosis 02/02/2020   History of recurrent UTI (urinary tract infection)    Bacterial infection due to Klebsiella pneumoniae    Laceration of left hand 12/09/2019   DNR (do not resuscitate) 12/09/2019   Pelvic fracture ([place]) 12/06/2019   AKI (acute kidney injury) ([place]) 12/06/2019   Urinary tract infection associated with catheterization of urinary tract, initial encounter ([place]) 07/23/2019   Hematuria 07/23/2019   Acute respiratory failure with hypoxia ([place]) 07/23/2019   Hypokalemia 07/23/2019   Acute on chronic anemia 07/23/2019   Fever 07/23/2019   Leukocytosis 07/23/2019   Generalized anxiety disorder 06/17/2019   Menopausal sweats 06/17/2019   Memory difficulty 03/02/2019   Degenerative spondylolisthesis 09/02/2018   Delirium 03/26/2018   Severe sepsis with septic shock ([place]) 03/26/2018   Degeneration of lumbar intervertebral disc 11/08/2017   Lumbar radiculopathy 11/06/2017   Chronic neck pain 09/12/2017   Chronic low back pain 09/06/2017   Chronic pain syndrome 09/06/2017   Dilated pancreatic duct 01/24/2017   DVT, lower extremity, distal, chronic ([place]) 01/22/2017   Elevated serum GGT level 01/22/2017   Medication monitoring encounter 01/08/2017   Neurologic gait dysfunction 11/14/2016   Hypotension    Urinary retention    Anxiety about health    Reactive depression    Ataxia    Abdominal spasms    Constipation due to pain medication    Acute lower UTI    Dysuria    Incomplete paraplegia (H[facility])    Acute blood loss anemia    Neurogenic bladder    Neuropathic pain    Muscle spasm    Gastroesophageal reflux disease    Slow transit constipation    Thrombocytopenia ([facility]) 10/03/2016   Abnormal MRI, spinal cord    Encephalomyelitis    Numbness    Intractable back pain 09/20/2016   Numbness of left lower extremity  09/20/2016   Hyponatremia 09/20/2016   Herpes zoster without complication 91/50/5697   Spondylosis of cervical region without myelopathy or radiculopathy 06/16/2015   PCP:  [name], MD Pharmacy:   [id number] [place] [facility] [address] [id number]: [phone]: [phone]    Social Determinants of Health (SDOH) Interventions    Readmission Risk Interventions     No data to display

## 2022-06-30 DIAGNOSIS — A419 Sepsis, unspecified organism: Secondary | ICD-10-CM | POA: Diagnosis not present

## 2022-06-30 DIAGNOSIS — N390 Urinary tract infection, site not specified: Secondary | ICD-10-CM | POA: Diagnosis not present

## 2022-06-30 DIAGNOSIS — I959 Hypotension, unspecified: Secondary | ICD-10-CM | POA: Diagnosis not present

## 2022-06-30 DIAGNOSIS — N3001 Acute cystitis with hematuria: Secondary | ICD-10-CM | POA: Diagnosis not present

## 2022-06-30 LAB — HEMOGLOBIN AND HEMATOCRIT, BLOOD
HCT: 23.5 % — ABNORMAL LOW (ref 36.0–46.0)
Hemoglobin: 7.5 g/dL — ABNORMAL LOW (ref 12.0–15.0)

## 2022-06-30 LAB — CBC
HCT: 22.8 % — ABNORMAL LOW (ref 36.0–46.0)
Hemoglobin: 7.2 g/dL — ABNORMAL LOW (ref 12.0–15.0)
MCH: 30.8 pg (ref 26.0–34.0)
MCHC: 31.6 g/dL (ref 30.0–36.0)
MCV: 97.4 fL (ref 80.0–100.0)
Platelets: 152 10*3/uL (ref 150–400)
RBC: 2.34 MIL/uL — ABNORMAL LOW (ref 3.87–5.11)
RDW: 17.3 % — ABNORMAL HIGH (ref 11.5–15.5)
WBC: 11.1 10*3/uL — ABNORMAL HIGH (ref 4.0–10.5)
nRBC: 1.4 % — ABNORMAL HIGH (ref 0.0–0.2)

## 2022-06-30 LAB — TYPE AND SCREEN
ABO/RH(D): O POS
Antibody Screen: POSITIVE
DAT, IgG: POSITIVE
Unit division: 0
Unit division: 0

## 2022-06-30 LAB — RENAL FUNCTION PANEL
Albumin: 2.4 g/dL — ABNORMAL LOW (ref 3.5–5.0)
Anion gap: 5 (ref 5–15)
BUN: 14 mg/dL (ref 8–23)
CO2: 25 mmol/L (ref 22–32)
Calcium: 7.8 mg/dL — ABNORMAL LOW (ref 8.9–10.3)
Chloride: 109 mmol/L (ref 98–111)
Creatinine, Ser: 0.78 mg/dL (ref 0.44–1.00)
GFR, Estimated: >60 mL/min (ref >60)
Glucose, Bld: 86 mg/dL (ref 70–99)
Phosphorus: 2 mg/dL — ABNORMAL LOW (ref 2.5–4.6)
Potassium: 3.1 mmol/L — ABNORMAL LOW (ref 3.5–5.1)
Sodium: 139 mmol/L (ref 135–145)

## 2022-06-30 LAB — BPAM RBC
Blood Product Expiration Date: 202311232359
Blood Product Expiration Date: 202311262359
ISSUE DATE / TIME: 202310251330
ISSUE DATE / TIME: 202310270806
Unit Type and Rh: 5100
Unit Type and Rh: 5100

## 2022-06-30 LAB — GLUCOSE, CAPILLARY
Glucose-Capillary: 84 mg/dL (ref 70–99)
Glucose-Capillary: 96 mg/dL (ref 70–99)
Glucose-Capillary: 100 mg/dL — ABNORMAL HIGH (ref 70–99)
Glucose-Capillary: 124 mg/dL — ABNORMAL HIGH (ref 70–99)

## 2022-06-30 LAB — MAGNESIUM: Magnesium: 1.7 mg/dL (ref 1.7–2.4)

## 2022-06-30 MED ORDER — POTASSIUM PHOSPHATES 15 MMOLE/5ML IV SOLN
15.0000 mmol | Freq: Once | INTRAVENOUS | Status: AC
  Administered 2022-06-30: 15 mmol via INTRAVENOUS
  Filled 2022-06-30 (×2): qty 5

## 2022-06-30 MED ORDER — POTASSIUM CHLORIDE 20 MEQ PO PACK
40.0000 meq | PACK | ORAL | Status: AC
  Administered 2022-06-30 (×2): 40 meq via ORAL
  Filled 2022-06-30 (×2): qty 2

## 2022-06-30 NOTE — Progress Notes (Signed)
Subjective/Chief Complaint:  1 - Neurogenic Bladder - chronic SPT at baseline.  2 - Gross Hematuria with Anemia / Retention - significant gross hematurai with clots on admission. ON eliquus for DVT/PE. Continuous bladder irrigation started in SPT, Out foley and Eliquus reversed. UCX 10/24, [place] 10/24 negative. CT 06/26/22 with clot in bladder, no upper tract lesions. CT 08/2021 also w/o overt GU neoplasia.  10/27 - bladder irrigation weaned to off 10/28 - urethral catheter removed, SPT reconnected to straight drain.   Today "Katelyn Lamb" is stable. Hematuria clearing nicely off irrigation, did not require any irrigation last night.    Objective: Vital signs in last 24 hours: Temp:  [98 F (36.7 C)-99.2 F (37.3 C)] 98.9 F (37.2 C) (10/28 0605) Pulse Rate:  [97-107] 97 (10/28 0605) Resp:  [17-20] 18 (10/28 0605) BP: (87-103)/(58-62) 90/62 (10/28 0605) SpO2:  [92 %-100 %] 97 % (10/28 0605) Last BM Date : 06/28/22  Intake/Output from previous day: 10/27 0701 - 10/28 0700 In: 6882.1 [P.O.:120; I.V.:60; Blood:332.1; IV Piggyback:370] Out: 4400 [Urine:4400] Intake/Output this shift: No intake/output data recorded.  EXAM: NAD, Stigmata of dementia. Most history form family at bedside Non-labored breathing on RA RRR SNTND SPT in place connected to CBI  (off) Urethral catheter in place with very very light pink urine Urethral catheter removed, SPT connected to straing drain, CBI set up taken down and discarded.   Lab Results:  Recent Labs    06/29/22 1312 06/29/22 1939  WBC 13.1* 15.4*  HGB 8.1* 8.0*  HCT 25.3* 24.6*  PLT 156 161   BMET Recent Labs    06/28/22 0351 06/29/22 0515  NA 140 141  K 3.4* 4.9  CL 111 114*  CO2 22 24  GLUCOSE 125* 133*  BUN 24* 22  CREATININE 0.93 1.02*  CALCIUM 7.9* 7.6*   PT/INR Recent Labs    06/28/22 0351  LABPROT 14.0  INR 1.1   ABG No results for input(s): "PHART", "HCO3" in the last 72 hours.  Invalid input(s): "PCO2",  "PO2"  Studies/Results: No results found.  Anti-infectives: Anti-infectives (From admission, onward)    Start     Dose/Rate Route Frequency Ordered Stop   06/27/22 2200  cefTRIAXone (ROCEPHIN) 2 g in sodium chloride 0.9 % 100 mL IVPB        2 g 200 mL/hr over 30 Minutes Intravenous Every 24 hours 06/27/22 1356 06/30/22 2359   06/27/22 1600  acyclovir (ZOVIRAX) tablet 400 mg        400 mg Oral 2 times daily 06/27/22 1357     06/26/22 1400  meropenem (MERREM) 1 g in sodium chloride 0.9 % 100 mL IVPB  Status:  Discontinued        1 g 200 mL/hr over 30 Minutes Intravenous Every 8 hours 06/26/22 1228 06/27/22 1355   06/26/22 0915  cefTRIAXone (ROCEPHIN) 1 g in sodium chloride 0.9 % 100 mL IVPB        1 g 200 mL/hr over 30 Minutes Intravenous  Once 06/26/22 0913 06/26/22 1143       Assessment/Plan:  Hematuruia resolving. Again, source appears to have been jsut cathteer irritation for SPT in setting of bacteruria and anticoagulation. Irrigation now stopped and back to her baseline SPTube. I feel will be adeuate for DC from GU perspective as soon as tomorrow as long as tolerating SPT overnight without clots or Hgb drop.   Perhaps even consider IVC filer if requires some time off anticaogulation given h/o PE.   Greatly appreciate  hospitalist team comangment.    Alexis Frock 06/30/2022

## 2022-06-30 NOTE — Progress Notes (Signed)
OT Cancellation Note  Patient Details Name: Katelyn Lamb MRN: [id number] DOB: Sep 25, 1945   Cancelled Treatment:    Reason Eval/Treat Not Completed:  Pt declined OT evaluation, requested OT return next day.  Malka So 06/30/2022, 2:56 PM Cleta Alberts, OTR/L Acute Rehabilitation Services Office: [phone]

## 2022-06-30 NOTE — Progress Notes (Signed)
PROGRESS NOTE  Katelyn Lamb OVF:[id number] DOB: Oct 29, 1945   PCP: [name], MD  Patient is from: Home.  Lives with husband.  Uses walker at baseline.  DOA: 06/26/2022 LOS: 4  Chief complaints Chief Complaint  Patient presents with   Hematuria     Brief Narrative / Interim history: 76 year old F with PMH of recurrent DVT/PE on Eliquis, gait dysfunction, neurogenic bladder with suprapubic cath, seizure disorder, herpes encephalitis, chronic steroid use and ESBL UTI presenting with bleeding from suprapubic catheter for 1 day with associated burning pain, and she was admitted for acute blood loss anemia due to gross hematuria and severe sepsis due to complicated UTI.  Hemoglobin dropped from 14-6.7.  Received Kcentra, and started on antibiotics and stress dose steroid.  Urology consulted.  Continuous bladder irrigation started.  Transfuse 2 units so far.  Bleeding seems to have subsided.  Urology following.  Subjective: Seen and examined earlier this morning.  No major events overnight of this morning.  No complaints.  She denies pain, shortness of breath, nausea or vomiting.  Nocturia seems to be clearing.  Objective: Vitals:   06/29/22 1055 06/29/22 1350 06/29/22 2217 06/30/22 0605  BP: (!) 90/59 103/62 (!) 90/59 90/62  Pulse: 97 (!) 107 97 97  Resp: '19 18 20 18  '$ Temp: 98.9 F (37.2 C) 98 F (36.7 C) 98.8 F (37.1 C) 98.9 F (37.2 C)  TempSrc: Axillary Oral Oral Oral  SpO2: 100% 92% 93% 97%  Weight:      Height:        Examination:  GENERAL: No apparent distress.  Nontoxic. HEENT: MMM.  Vision and hearing grossly intact.  NECK: Supple.  No apparent JVD.  RESP:  No IWOB.  Fair aeration bilaterally. CVS:  RRR. Heart sounds normal.  ABD/GI/GU: BS+. Abd soft, NTND.  Suprapubic catheter. MSK/EXT:  Moves extremities. No apparent deformity. No edema.  SKIN: no apparent skin lesion or wound NEURO: Awake and alert. Oriented fairly oriented.  No apparent focal neuro  deficit. PSYCH: Calm. Normal affect.   Procedures:  Continuous bladder irrigation  Microbiology summarized: Urine culture NGTD Blood culture NGTD MRSA PCR screen nonreactive.  Assessment and plan: Principal Problem:   Hematuria due to acute cystitis Active Problems:   Severe sepsis with septic shock (HCC)   Hypotension   Neurogenic bladder   Chronic suprapubic catheter (HCC)   History of pulmonary embolism   History of DVT (deep vein thrombosis)   Incomplete paraplegia (HCC)   Chronic pain syndrome   History of herpes encephalitis   Current chronic use of systemic steroids   DNR (do not resuscitate)   Hypoxemia   History of ESBL E. coli infection   Seizure disorder ([place])   Gross hematuria   Hyperglycemia   Complicated UTI (urinary tract infection)   Bandemia   Hemorrhagic shock ([place])  Hemorrhagic shock due to gross hematuria in patient with suprapubic catheter: Received 2 units of PRBC so far.  Bleeding seems to have subsided.  Hemolysis labs negative.  Iron deficient. Recent Labs    06/27/22 1058 06/27/22 1811 06/27/22 2012 06/28/22 0048 06/28/22 0351 06/28/22 0749 06/29/22 0515 06/29/22 1312 06/29/22 1939 06/30/22 0921  HGB 6.7* 8.2* 9.3* 9.0* 8.3* 8.6* 6.9* 8.1* 8.0* 7.2*  -Appreciate help by oncology and urology -Continue IV ceftriaxone for total of 5 days -IV ferric gluconate 250 mg daily x2 -Recheck CBC in the morning -Check H&H this afternoon.  Transfuse for Hgb <7.0.  Severe sepsis with septic shock due to  UTI from suprapubic catheter: Had SIRS, lactic acidosis and hypotension.  Sepsis physiology improving. -Meropenem>> ceftriaxone for a total of 5 days -Monitor leukocytosis-improving -Wean off stress dose steroid as able -Midodrine 5 mg 3 times daily for BP support  Hypoxemia: Likely due to OSA, Valium and Lyrica.  Not on CPAP.  Resolved. -We may have to decrease Valium and/or Lyrica  Lactic acidosis: Resolved.   Neurogenic bladder with  chronic indwelling suprapubic catheter: Complication of HSV encephalitis. -Urology on board   History of recurrent DVT/PE on chronic anticoagulation -Continue holding anticoagulation   History of HSV encephalitis/chronic gait disorder/chronic pain syndrome and chronic steroid use -DNR/DNI. -Appreciate input by palliative  Anxiety and tremor -Continue Valium and Lyrica.  May decrease dose  Constipation -MiraLAX and Senokot-S as needed  Bandemia: Could be due to sepsis or steroid.  Improved. -Continue monitoring  Hyperglycemia: Likely due to steroid.  A1c 5.2%. -Continue CBG monitoring and SSI while on stress dose steroid  Hypokalemia/hypophosphatemia/hypomagnesemia -Monitor and replenish as appropriate  History of hemorrhoids -Continue Tucks  Body mass index is 29.39 kg/m.    DVT prophylaxis:  Place TED hose Start: 06/26/22 0950  Code Status: DNR/DNI Family Communication: None at bedside today Level of care: Progressive Status is: Inpatient Remains inpatient appropriate because: Gross hematuria, severe sepsis in the setting of complicated UTI   Final disposition: TBD Consultants:  Urology Oncology/hematology Palliative medicine  Sch Meds:  Scheduled Meds:  acyclovir  400 mg Oral BID   Chlorhexidine Gluconate Cloth  6 each Topical Daily   DULoxetine  60 mg Oral QHS   hydrocortisone sod succinate (SOLU-CORTEF) inj  50 mg Intravenous Q12H   insulin aspart  0-9 Units Subcutaneous TID WC   midodrine  5 mg Oral BID WC   mirabegron ER  50 mg Oral QHS   pantoprazole  40 mg Oral QHS   pregabalin  50 mg Oral BID   QUEtiapine  50 mg Oral QHS   simethicone  80 mg Oral QID   sodium chloride flush  3 mL Intravenous Q12H   Continuous Infusions:  cefTRIAXone (ROCEPHIN)  IV 2 g (06/29/22 2143)   sodium chloride irrigation     PRN Meds:.acetaminophen **OR** acetaminophen, albuterol, diazepam, diphenhydrAMINE, HYDROcodone-acetaminophen, ondansetron (ZOFRAN) IV,  polyethylene glycol, senna-docusate, witch hazel-glycerin  Antimicrobials: Anti-infectives (From admission, onward)    Start     Dose/Rate Route Frequency Ordered Stop   06/27/22 2200  cefTRIAXone (ROCEPHIN) 2 g in sodium chloride 0.9 % 100 mL IVPB        2 g 200 mL/hr over 30 Minutes Intravenous Every 24 hours 06/27/22 1356 06/30/22 2359   06/27/22 1600  acyclovir (ZOVIRAX) tablet 400 mg        400 mg Oral 2 times daily 06/27/22 1357     06/26/22 1400  meropenem (MERREM) 1 g in sodium chloride 0.9 % 100 mL IVPB  Status:  Discontinued        1 g 200 mL/hr over 30 Minutes Intravenous Every 8 hours 06/26/22 1228 06/27/22 1355   06/26/22 0915  cefTRIAXone (ROCEPHIN) 1 g in sodium chloride 0.9 % 100 mL IVPB        1 g 200 mL/hr over 30 Minutes Intravenous  Once 06/26/22 0913 06/26/22 1143        I have personally reviewed the following labs and images: CBC: Recent Labs  Lab 06/26/22 0820 06/26/22 1101 06/28/22 0351 06/28/22 0749 06/29/22 0515 06/29/22 1312 06/29/22 1939 06/30/22 0921  WBC 18.7*   < >  27.6*  --  14.4* 13.1* 15.4* 11.1*  NEUTROABS 10.7*  --  21.2*  --  11.8*  --   --   --   HGB 11.6*   < > 8.3* 8.6* 6.9* 8.1* 8.0* 7.2*  HCT 37.9   < > 24.7* 25.7* 22.2* 25.3* 24.6* 22.8*  MCV 105.9*   < > 94.3  --  101.4* 98.1 98.4 97.4  PLT 310   < > 199  --  177 156 161 152   < > = values in this interval not displayed.   BMP &GFR Recent Labs  Lab 06/26/22 0443 06/27/22 0255 06/28/22 0351 06/29/22 0515 06/30/22 0921  NA 141 137 140 141 139  K 4.6 4.8 3.4* 4.9 3.1*  CL 101 107 111 114* 109  CO2 25 20* '22 24 25  '$ GLUCOSE 112* 144* 125* 133* 86  BUN 24* 27* 24* 22 14  CREATININE 0.79 0.95 0.93 1.02* 0.78  CALCIUM 9.0 7.5* 7.9* 7.6* 7.8*  MG  --   --  1.9 1.6* 1.7  PHOS  --   --  1.8* 4.7* 2.0*   Estimated Creatinine Clearance: 64.5 mL/min (by C-G formula based on SCr of 0.78 mg/dL). Liver & Pancreas: Recent Labs  Lab 06/28/22 0351 06/29/22 0515 06/30/22 0921   AST 41  --   --   ALT 35  --   --   ALKPHOS 58  --   --   BILITOT 0.5  --   --   PROT 5.4*  --   --   ALBUMIN 2.9* 2.7* 2.4*   No results for input(s): "LIPASE", "AMYLASE" in the last 168 hours. No results for input(s): "AMMONIA" in the last 168 hours. Diabetic: No results for input(s): "HGBA1C" in the last 72 hours.  Recent Labs  Lab 06/29/22 1204 06/29/22 1635 06/29/22 2208 06/30/22 0752 06/30/22 1211  GLUCAP 91 111* 97 84 96   Cardiac Enzymes: No results for input(s): "CKTOTAL", "CKMB", "CKMBINDEX", "TROPONINI" in the last 168 hours. No results for input(s): "PROBNP" in the last 8760 hours. Coagulation Profile: Recent Labs  Lab 06/26/22 0900 06/28/22 0351  INR 1.5* 1.1   Thyroid Function Tests: No results for input(s): "TSH", "T4TOTAL", "FREET4", "T3FREE", "THYROIDAB" in the last 72 hours. Lipid Profile: No results for input(s): "CHOL", "HDL", "LDLCALC", "TRIG", "CHOLHDL", "LDLDIRECT" in the last 72 hours. Anemia Panel: Recent Labs    06/29/22 0515  VITAMINB12 1,261*  FOLATE 17.6  FERRITIN 23  TIBC 341  IRON 15*  RETICCTPCT 4.2*   Urine analysis:    Component Value Date/Time   COLORURINE RED (A) 06/26/2022 0900   APPEARANCEUR TURBID (A) 06/26/2022 0900   LABSPEC  06/26/2022 0900    TEST NOT REPORTED DUE TO COLOR INTERFERENCE OF URINE PIGMENT   PHURINE  06/26/2022 0900    TEST NOT REPORTED DUE TO COLOR INTERFERENCE OF URINE PIGMENT   GLUCOSEU (A) 06/26/2022 0900    TEST NOT REPORTED DUE TO COLOR INTERFERENCE OF URINE PIGMENT   HGBUR (A) 06/26/2022 0900    TEST NOT REPORTED DUE TO COLOR INTERFERENCE OF URINE PIGMENT   BILIRUBINUR (A) 06/26/2022 0900    TEST NOT REPORTED DUE TO COLOR INTERFERENCE OF URINE PIGMENT   KETONESUR (A) 06/26/2022 0900    TEST NOT REPORTED DUE TO COLOR INTERFERENCE OF URINE PIGMENT   PROTEINUR (A) 06/26/2022 0900    TEST NOT REPORTED DUE TO COLOR INTERFERENCE OF URINE PIGMENT   NITRITE (A) 06/26/2022 0900    TEST NOT  REPORTED DUE TO COLOR INTERFERENCE OF URINE PIGMENT   LEUKOCYTESUR (A) 06/26/2022 0900    TEST NOT REPORTED DUE TO COLOR INTERFERENCE OF URINE PIGMENT   Sepsis Labs: Invalid input(s): "PROCALCITONIN", "LACTICIDVEN"  Microbiology: Recent Results (from the past 240 hour(s))  Blood culture (routine x 2)     Status: None (Preliminary result)   Collection Time: 06/26/22  8:33 AM   Specimen: BLOOD RIGHT HAND  Result Value Ref Range Status   Specimen Description BLOOD RIGHT HAND  Final   Special Requests   Final    BOTTLES DRAWN AEROBIC ONLY Blood Culture results may not be optimal due to an inadequate volume of blood received in culture bottles   Culture   Final    NO GROWTH 4 DAYS Performed at [facility], [place] [address] [place], [place] [address]    Report Status PENDING  Incomplete  Urine Culture     Status: None   Collection Time: 06/26/22  9:13 AM   Specimen: Urine, Catheterized  Result Value Ref Range Status   Specimen Description URINE, CATHETERIZED  Final   Special Requests NONE  Final   Culture   Final    NO GROWTH Performed at [facility] [address], [place], [place] [address]    Report Status 06/27/2022 FINAL  Final  MRSA Next Gen by PCR, Nasal     Status: None   Collection Time: 06/26/22  7:02 PM   Specimen: Nasal Mucosa; Nasal Swab  Result Value Ref Range Status   MRSA by PCR Next Gen NOT DETECTED NOT DETECTED Final    Comment: (NOTE) The GeneXpert MRSA Assay (FDA approved for NASAL specimens only), is one component of a comprehensive MRSA colonization surveillance program. It is not intended to diagnose MRSA infection nor to guide or monitor treatment for MRSA infections. Test performance is not FDA approved in patients less than 29 years old. Performed at [facility], [place] [address], [place], [place] [address]   Blood culture (routine x 2)     Status: None (Preliminary result)   Collection Time: 06/26/22 10:50 PM    Specimen: BLOOD  Result Value Ref Range Status   Specimen Description   Final    BLOOD BLOOD LEFT FOREARM Performed at [facility] [address] [place], [place] [address]    Special Requests   Final    BOTTLES DRAWN AEROBIC ONLY Blood Culture adequate volume Performed at [facility] [address], [place], [place] [address]    Culture   Final    NO GROWTH 3 DAYS Performed at [facility], [place] [address] [place], [place] [address]    Report Status PENDING  Incomplete    Radiology Studies: No results found.    Skyleen Bentley T. Rittman  If 7PM-7AM, please contact night-coverage [email or web address] 06/30/2022, 2:59 PM

## 2022-06-30 NOTE — Progress Notes (Signed)
  Transition of Care (TOC) Screening Note   Patient Details  Name: Katelyn Lamb Date of Birth: 1945-09-16   [facility] CM/SW Contact:    Kimber Relic, LCSW Phone Number: 06/30/2022, 11:20 AM    Transition of Care Department [facility]) has reviewed patient and no TOC needs have been identified at this time. We will continue to monitor patient advancement through interdisciplinary progression rounds. If new patient transition needs arise, please place a TOC consult.

## 2022-06-30 NOTE — Progress Notes (Signed)
PT Cancellation Note  Patient Details Name: Katelyn Lamb MRN: [id number] DOB: 08-Jan-1946   Cancelled Treatment:    Reason Eval/Treat Not Completed: Patient declined, no reason specified. Pt requesting to receive iron supplement from RN prior to mobility and expressed concern about IV, RN notified. Will attempt to follow up as schedule and pt status allows which may be another day.    Coolidge Breeze, PT, DPT [facility] Department Office: [phone] Weekend pager: [phone] Coolidge Breeze 06/30/2022, 1:39 PM

## 2022-06-30 NOTE — Plan of Care (Signed)
  Problem: Clinical Measurements: Goal: Diagnostic test results will improve Outcome: Progressing   Problem: Pain Managment: Goal: General experience of comfort will improve Outcome: Progressing

## 2022-07-01 DIAGNOSIS — I959 Hypotension, unspecified: Secondary | ICD-10-CM | POA: Diagnosis not present

## 2022-07-01 DIAGNOSIS — N3001 Acute cystitis with hematuria: Secondary | ICD-10-CM | POA: Diagnosis not present

## 2022-07-01 DIAGNOSIS — N390 Urinary tract infection, site not specified: Secondary | ICD-10-CM | POA: Diagnosis not present

## 2022-07-01 DIAGNOSIS — A419 Sepsis, unspecified organism: Secondary | ICD-10-CM | POA: Diagnosis not present

## 2022-07-01 LAB — CBC
HCT: 25.8 % — ABNORMAL LOW (ref 36.0–46.0)
Hemoglobin: 8.1 g/dL — ABNORMAL LOW (ref 12.0–15.0)
MCH: 31.5 pg (ref 26.0–34.0)
MCHC: 31.4 g/dL (ref 30.0–36.0)
MCV: 100.4 fL — ABNORMAL HIGH (ref 80.0–100.0)
Platelets: 165 10*3/uL (ref 150–400)
RBC: 2.57 MIL/uL — ABNORMAL LOW (ref 3.87–5.11)
RDW: 17.5 % — ABNORMAL HIGH (ref 11.5–15.5)
WBC: 11.9 10*3/uL — ABNORMAL HIGH (ref 4.0–10.5)
nRBC: 1.5 % — ABNORMAL HIGH (ref 0.0–0.2)

## 2022-07-01 LAB — CULTURE, BLOOD (ROUTINE X 2): Culture: NO GROWTH

## 2022-07-01 LAB — RENAL FUNCTION PANEL
Albumin: 2.7 g/dL — ABNORMAL LOW (ref 3.5–5.0)
Anion gap: 5 (ref 5–15)
BUN: 10 mg/dL (ref 8–23)
CO2: 29 mmol/L (ref 22–32)
Calcium: 8.2 mg/dL — ABNORMAL LOW (ref 8.9–10.3)
Chloride: 109 mmol/L (ref 98–111)
Creatinine, Ser: 0.7 mg/dL (ref 0.44–1.00)
GFR, Estimated: >60 mL/min (ref >60)
Glucose, Bld: 101 mg/dL — ABNORMAL HIGH (ref 70–99)
Phosphorus: 2.8 mg/dL (ref 2.5–4.6)
Potassium: 3.5 mmol/L (ref 3.5–5.1)
Sodium: 143 mmol/L (ref 135–145)

## 2022-07-01 LAB — GLUCOSE, CAPILLARY
Glucose-Capillary: 89 mg/dL (ref 70–99)
Glucose-Capillary: 109 mg/dL — ABNORMAL HIGH (ref 70–99)
Glucose-Capillary: 144 mg/dL — ABNORMAL HIGH (ref 70–99)
Glucose-Capillary: 122 mg/dL — ABNORMAL HIGH (ref 70–99)

## 2022-07-01 LAB — MAGNESIUM: Magnesium: 1.8 mg/dL (ref 1.7–2.4)

## 2022-07-01 MED ORDER — PREDNISONE 5 MG PO TABS
10.0000 mg | ORAL_TABLET | Freq: Every day | ORAL | Status: AC
  Administered 2022-07-01 – 2022-07-02 (×2): 10 mg via ORAL
  Filled 2022-07-01 (×2): qty 2

## 2022-07-01 MED ORDER — HYDROCORTISONE SOD SUC (PF) 100 MG IJ SOLR: 50.0000 mg | Freq: Every day | INTRAMUSCULAR | Status: DC

## 2022-07-01 MED ORDER — POTASSIUM CHLORIDE CRYS ER 20 MEQ PO TBCR
40.0000 meq | EXTENDED_RELEASE_TABLET | Freq: Once | ORAL | Status: AC
  Administered 2022-07-01: 40 meq via ORAL
  Filled 2022-07-01: qty 2

## 2022-07-01 MED ORDER — PHENAZOPYRIDINE HCL 200 MG PO TABS
200.0000 mg | ORAL_TABLET | Freq: Three times a day (TID) | ORAL | Status: DC
  Administered 2022-07-01 – 2022-07-03 (×6): 200 mg via ORAL
  Filled 2022-07-01 (×8): qty 1

## 2022-07-01 MED ORDER — DICLOFENAC SODIUM 1 % EX GEL
2.0000 g | Freq: Four times a day (QID) | CUTANEOUS | Status: DC
  Administered 2022-07-02: 2 g via TOPICAL
  Filled 2022-07-01: qty 100

## 2022-07-01 MED ORDER — PREDNISONE 5 MG PO TABS
5.0000 mg | ORAL_TABLET | Freq: Every day | ORAL | Status: DC
  Administered 2022-07-03: 5 mg via ORAL
  Filled 2022-07-01: qty 1

## 2022-07-01 MED ORDER — PREDNISONE 5 MG PO TABS: 5.0000 mg | ORAL_TABLET | Freq: Every day | ORAL | Status: DC

## 2022-07-01 NOTE — Evaluation (Signed)
Physical Therapy Evaluation Patient Details Name: Katelyn Lamb MRN: [id number] DOB: Jan 14, 1946 Today's Date: 07/01/2022  History of Present Illness  Pt is a 76yo female [facility] ED on [facility]/22 with complaint of hematuria and disorientation. PMH: hx of DVT, anxiety, back pain, CKD, gait abnormality, memory difficulty, neurogenic bladder on suprapubic catheter.  Clinical Impression  Pt admitted with above diagnosis.  PT reports recent functional decline, at baseline able to perform stand step pivot with ~ min/guard assist. Recommend HHPT if available, pt feels she could benefit from this. Pt has 24 hour care at home, husband present and confirms.   Pt currently with functional limitations due to the deficits listed below (see PT Problem List). Pt will benefit from skilled PT to increase their independence and safety with mobility to allow discharge to the venue listed below.          Recommendations for follow up therapy are one component of a multi-disciplinary discharge planning process, led by the attending physician.  Recommendations may be updated based on patient status, additional functional criteria and insurance authorization.  Follow Up Recommendations Home health PT      Assistance Recommended at Discharge Frequent or constant Supervision/Assistance  Patient can return home with the following  Two people to help with walking and/or transfers;Two people to help with bathing/dressing/bathroom;Assist for transportation;Assistance with cooking/housework;Help with stairs or ramp for entrance    Equipment Recommendations None recommended by PT  Recommendations for Other Services       Functional Status Assessment Patient has had a recent decline in their functional status and demonstrates the ability to make significant improvements in function in a reasonable and predictable amount of time.     Precautions / Restrictions Precautions Precautions: Fall Restrictions Weight  Bearing Restrictions: No      Mobility  Bed Mobility Overal bed mobility: Needs Assistance Bed Mobility: Supine to Sit     Supine to sit: Max assist, +2 for physical assistance, +2 for safety/equipment, Mod assist     General bed mobility comments: assist with LEs and to elevate trunk. cues to self assist. bed pad utilized to complete transition to EOB    Transfers Overall transfer level: Needs assistance Equipment used: Rolling walker (2 wheels) Transfers: Sit to/from Stand Sit to Stand: Mod assist, +2 physical assistance, +2 safety/equipment           General transfer comment: STS x2, improved pt effort/decr assist needed on second trial however difficulty withfull  trunk/hip/knee extension and posterior bias on both standing trials. fatigues easily    Ambulation/Gait               General Gait Details: non-amb at baseline  Stairs            Wheelchair Mobility    Modified Rankin (Stroke Patients Only)       Balance Overall balance assessment: Needs assistance, History of Falls Sitting-balance support: Feet supported, Bilateral upper extremity supported Sitting balance-Leahy Scale: Fair Sitting balance - Comments: incr time to achieve midline Postural control: Posterior lean Standing balance support: During functional activity, Reliant on assistive device for balance, Bilateral upper extremity supported Standing balance-Leahy Scale: Zero Standing balance comment: reliant on UEs and external support of 2 people                             Pertinent Vitals/Pain Pain Assessment Pain Assessment: No/denies pain    Home Living Family/patient expects to  be discharged to:: Private residence Living Arrangements: Spouse/significant other Available Help at Discharge: Available 24 hours/day;Family;Personal care attendant Type of Home: House Home Access: [place]: Able to live on main level with  bedroom/bathroom;Two level Home Equipment: Tub bench;Rolling Walker (2 wheels);BSC/3in1;Grab bars - tub/shower;Wheelchair - manual;Hospital bed      Prior Function Prior Level of Function : Needs assist             Mobility Comments: Pt states she has not been walking, requires assist to transfer to w/c and toilet (normally able to stand-step pivot )       Hand Dominance        Extremity/Trunk Assessment   Upper Extremity Assessment Upper Extremity Assessment: Defer to OT evaluation    Lower Extremity Assessment Lower Extremity Assessment: Overall WFL for tasks assessed       Communication      Cognition Arousal/Alertness: Awake/alert Behavior During Therapy: WFL for tasks assessed/performed Overall Cognitive Status: Within Functional Limits for tasks assessed                                 General Comments: tangential at times, occasional redirection to task        General Comments      Exercises     Assessment/Plan    PT Assessment Patient needs continued PT services  PT Problem List Decreased strength;Decreased mobility;Decreased activity tolerance;Decreased balance       PT Treatment Interventions DME instruction;Therapeutic exercise;Functional mobility training;Therapeutic activities;Patient/family education;Balance training    PT Goals (Current goals can be found in the Care Plan section)  Acute Rehab PT Goals Patient Stated Goal: to get a little stronger PT Goal Formulation: With patient Time For Goal Achievement: 07/15/22 Potential to Achieve Goals: Fair    Frequency Min 2X/week     Co-evaluation               AM-PAC PT "6 Clicks" Mobility  Outcome Measure Help needed turning from your back to your side while in a flat bed without using bedrails?: A Lot Help needed moving from lying on your back to sitting on the side of a flat bed without using bedrails?: Total Help needed moving to and from a bed to a chair  (including a wheelchair)?: Total Help needed standing up from a chair using your arms (e.g., wheelchair or bedside chair)?: Total Help needed to walk in hospital room?: Total Help needed climbing 3-5 steps with a railing? : Total 6 Click Score: 7    End of Session Equipment Utilized During Treatment: Gait belt Activity Tolerance: Patient limited by fatigue Patient left: in bed;with call bell/phone within reach;with bed alarm set;with family/visitor present Nurse Communication: Mobility status PT Visit Diagnosis: Other abnormalities of gait and mobility (R26.89);Difficulty in walking, not elsewhere classified (R26.2)    Time:  -      Charges:              Baxter Flattery, PT  Acute Rehab Dept [facility]) [phone]  [facility] Weekend Pager [facility] only)  [phone]  07/01/2022   Katelyn Lamb 07/01/2022, 2:21 PM

## 2022-07-01 NOTE — Progress Notes (Signed)
PROGRESS NOTE  Katelyn Lamb ZOX:[id number] DOB: [birth year]   PCP: [name], MD  Patient is from: Home.  Lives with husband.  Uses walker at baseline.  DOA: 06/26/2022 LOS: 5  Chief complaints Chief Complaint  Patient presents with   Hematuria     Brief Narrative / Interim history: 76 year old F with PMH of recurrent DVT/PE on Eliquis, gait dysfunction, neurogenic bladder with suprapubic cath, seizure disorder, herpes encephalitis, chronic steroid use and ESBL UTI presenting with bleeding from suprapubic catheter for 1 day with associated burning pain, and she was admitted for acute blood loss anemia due to gross hematuria and severe sepsis due to complicated UTI.  Hemoglobin dropped from 14-6.7.  Received Kcentra, and started on antibiotics and stress dose steroid.  Urology consulted.  Continuous bladder irrigation started.  Transfused 2 units so far.  Bleeding seems to have subsided.  Urology following.  Subjective: Seen and examined earlier this morning.  She is complaining of burning when she passes blood clots.  Also feels fullness at times.  She said she had diarrhea last night.  Feels better now.  She is eager to know where she is bleeding from although although her hemoglobin seems to be stable.  He might be passing old blood clots.  No other complaints.  Granddaughter at bedside.  Objective: Vitals:   06/30/22 0605 06/30/22 1648 06/30/22 2037 07/01/22 0345  BP: 90/62 99/64 106/69 122/76  Pulse: 97 95 92 98  Resp: '18 18 16 18  '$ Temp: 98.9 F (37.2 C) 99.1 F (37.3 C) 98 F (36.7 C) 98 F (36.7 C)  TempSrc: Oral Oral Temporal Oral  SpO2: 97% 93% 95% 93%  Weight:      Height:        Examination:  GENERAL: No apparent distress.  Nontoxic. HEENT: MMM.  Vision and hearing grossly intact.  NECK: Supple.  No apparent JVD.  RESP:  No IWOB.  Fair aeration bilaterally. CVS:  RRR. Heart sounds normal.  ABD/GI/GU: BS+. Abd soft, NTND.  Suprapubic Foley.  Clear looking  urine.  Some blood clots on bed mat MSK/EXT:  Moves extremities. No apparent deformity. No edema.  SKIN: no apparent skin lesion or wound NEURO: Awake and alert. Oriented fairly.  No apparent focal neuro deficit. PSYCH: Calm. Normal affect.   Procedures:  Continuous bladder irrigation  Microbiology summarized: Urine culture NGTD Blood culture NGTD MRSA PCR screen nonreactive.  Assessment and plan: Principal Problem:   Hematuria due to acute cystitis Active Problems:   Severe sepsis with septic shock (HCC)   Hypotension   Neurogenic bladder   Chronic suprapubic catheter (HCC)   History of pulmonary embolism   History of DVT (deep vein thrombosis)   Incomplete paraplegia (HCC)   Chronic pain syndrome   History of herpes encephalitis   Current chronic use of systemic steroids   DNR (do not resuscitate)   Hypoxemia   History of ESBL E. coli infection   Seizure disorder ([place])   Gross hematuria   Hyperglycemia   Complicated UTI (urinary tract infection)   Bandemia   Hemorrhagic shock ([place])  Hemorrhagic shock due to gross hematuria in patient with suprapubic catheter: Received 2 units of PRBC so far.  Bleeding seems to have subsided.  Hemolysis labs negative.  Iron deficient. Recent Labs    06/27/22 2012 06/28/22 0048 06/28/22 0351 06/28/22 0749 06/29/22 0515 06/29/22 1312 06/29/22 1939 06/30/22 0921 06/30/22 1627 07/01/22 0608  HGB 9.3* 9.0* 8.3* 8.6* 6.9* 8.1* 8.0* 7.2* 7.5* 8.1*  -  Continue IV ceftriaxone for total of 5 days -Received IV ferric gluconate 250 mg daily x2 -Monitor H&H and transfuse for Hgb <7.0.   Severe sepsis with septic shock due to UTI from suprapubic catheter: Had SIRS, lactic acidosis and hypotension.  Sepsis physiology resolving.  Blood and urine cultures NGTD -Meropenem>> ceftriaxone.  Completed 5 days course. -Transition to p.o. prednisone 10 mg daily for 2 days then to home 5 mg -Continue midodrine 5 mg 3 times daily for BP  support  Hypoxemia: Likely due to OSA, Valium and Lyrica.  Not on CPAP.  Resolved. -May have to decrease Valium and/or Lyrica  Lactic acidosis: Resolved.   Neurogenic bladder with chronic indwelling suprapubic catheter: Complication of HSV encephalitis. -Urology on board -Agree with Pyridium for dysuria in-house   History of recurrent DVT/PE on chronic anticoagulation --Discussed with urology.  Okay to resume AC and monitor. We may start heparin without bolus this afternoon if no further bleeding or blood clot.   History of HSV encephalitis/chronic gait disorder/chronic pain syndrome and chronic steroid use -DNR/DNI. -Appreciate input by palliative  Anxiety and tremor -Continue Valium and Lyrica.    Constipation -MiraLAX and Senokot-S as needed  Bandemia: Could be due to sepsis or steroid.  Improved. -Continue monitoring  Hyperglycemia: Likely due to steroid.  A1c 5.2%. -Continue CBG monitoring and SSI while on stress dose steroid  Hypokalemia/hypophosphatemia/hypomagnesemia -Monitor and replenish as appropriate  History of hemorrhoids -Continue Tucks  Body mass index is 29.39 kg/m.    DVT prophylaxis:  Place TED hose Start: 06/26/22 0950  Code Status: DNR/DNI Family Communication: Granddaughter at bedside. Level of care: Telemetry Status is: Inpatient Remains inpatient appropriate because: Gross hematuria, severe sepsis in the setting of complicated UTI   Final disposition: TBD Consultants:  Urology Oncology/hematology Palliative medicine  Sch Meds:  Scheduled Meds:  acyclovir  400 mg Oral BID   Chlorhexidine Gluconate Cloth  6 each Topical Daily   DULoxetine  60 mg Oral QHS   insulin aspart  0-9 Units Subcutaneous TID WC   midodrine  5 mg Oral BID WC   mirabegron ER  50 mg Oral QHS   pantoprazole  40 mg Oral QHS   phenazopyridine  200 mg Oral TID WC   predniSONE  10 mg Oral Q breakfast   Followed by   Derrill Memo ON 07/03/2022] predniSONE  5 mg Oral Q  breakfast   pregabalin  50 mg Oral BID   QUEtiapine  50 mg Oral QHS   simethicone  80 mg Oral QID   sodium chloride flush  3 mL Intravenous Q12H   Continuous Infusions:  sodium chloride irrigation     PRN Meds:.acetaminophen **OR** acetaminophen, albuterol, diazepam, diphenhydrAMINE, HYDROcodone-acetaminophen, ondansetron (ZOFRAN) IV, polyethylene glycol, senna-docusate, witch hazel-glycerin  Antimicrobials: Anti-infectives (From admission, onward)    Start     Dose/Rate Route Frequency Ordered Stop   06/27/22 2200  cefTRIAXone (ROCEPHIN) 2 g in sodium chloride 0.9 % 100 mL IVPB        2 g 200 mL/hr over 30 Minutes Intravenous Every 24 hours 06/27/22 1356 07/01/22 0106   06/27/22 1600  acyclovir (ZOVIRAX) tablet 400 mg        400 mg Oral 2 times daily 06/27/22 1357     06/26/22 1400  meropenem (MERREM) 1 g in sodium chloride 0.9 % 100 mL IVPB  Status:  Discontinued        1 g 200 mL/hr over 30 Minutes Intravenous Every 8 hours 06/26/22 1228 06/27/22 1355  06/26/22 0915  cefTRIAXone (ROCEPHIN) 1 g in sodium chloride 0.9 % 100 mL IVPB        1 g 200 mL/hr over 30 Minutes Intravenous  Once 06/26/22 0913 06/26/22 1143        I have personally reviewed the following labs and images: CBC: Recent Labs  Lab 06/26/22 0820 06/26/22 1101 06/28/22 0351 06/28/22 0749 06/29/22 0515 06/29/22 1312 06/29/22 1939 06/30/22 0921 06/30/22 1627 07/01/22 0608  WBC 18.7*   < > 27.6*  --  14.4* 13.1* 15.4* 11.1*  --  11.9*  NEUTROABS 10.7*  --  21.2*  --  11.8*  --   --   --   --   --   HGB 11.6*   < > 8.3*   < > 6.9* 8.1* 8.0* 7.2* 7.5* 8.1*  HCT 37.9   < > 24.7*   < > 22.2* 25.3* 24.6* 22.8* 23.5* 25.8*  MCV 105.9*   < > 94.3  --  101.4* 98.1 98.4 97.4  --  100.4*  PLT 310   < > 199  --  177 156 161 152  --  165   < > = values in this interval not displayed.   BMP &GFR Recent Labs  Lab 06/27/22 0255 06/28/22 0351 06/29/22 0515 06/30/22 0921 07/01/22 0608  NA 137 140 141 139 143   K 4.8 3.4* 4.9 3.1* 3.5  CL 107 111 114* 109 109  CO2 20* '22 24 25 29  '$ GLUCOSE 144* 125* 133* 86 101*  BUN 27* 24* '22 14 10  '$ CREATININE 0.95 0.93 1.02* 0.78 0.70  CALCIUM 7.5* 7.9* 7.6* 7.8* 8.2*  MG  --  1.9 1.6* 1.7 1.8  PHOS  --  1.8* 4.7* 2.0* 2.8   Estimated Creatinine Clearance: 64.5 mL/min (by C-G formula based on SCr of 0.7 mg/dL). Liver & Pancreas: Recent Labs  Lab 06/28/22 0351 06/29/22 0515 06/30/22 0921 07/01/22 0608  AST 41  --   --   --   ALT 35  --   --   --   ALKPHOS 58  --   --   --   BILITOT 0.5  --   --   --   PROT 5.4*  --   --   --   ALBUMIN 2.9* 2.7* 2.4* 2.7*   No results for input(s): "LIPASE", "AMYLASE" in the last 168 hours. No results for input(s): "AMMONIA" in the last 168 hours. Diabetic: No results for input(s): "HGBA1C" in the last 72 hours.  Recent Labs  Lab 06/30/22 0752 06/30/22 1211 06/30/22 1641 06/30/22 2029 07/01/22 0736  GLUCAP 84 96 100* 124* 89   Cardiac Enzymes: No results for input(s): "CKTOTAL", "CKMB", "CKMBINDEX", "TROPONINI" in the last 168 hours. No results for input(s): "PROBNP" in the last 8760 hours. Coagulation Profile: Recent Labs  Lab 06/26/22 0900 06/28/22 0351  INR 1.5* 1.1   Thyroid Function Tests: No results for input(s): "TSH", "T4TOTAL", "FREET4", "T3FREE", "THYROIDAB" in the last 72 hours. Lipid Profile: No results for input(s): "CHOL", "HDL", "LDLCALC", "TRIG", "CHOLHDL", "LDLDIRECT" in the last 72 hours. Anemia Panel: Recent Labs    06/29/22 0515  VITAMINB12 1,261*  FOLATE 17.6  FERRITIN 23  TIBC 341  IRON 15*  RETICCTPCT 4.2*   Urine analysis:    Component Value Date/Time   COLORURINE RED (A) 06/26/2022 0900   APPEARANCEUR TURBID (A) 06/26/2022 0900   LABSPEC  06/26/2022 0900    TEST NOT REPORTED DUE TO COLOR INTERFERENCE OF URINE PIGMENT  PHURINE  06/26/2022 0900    TEST NOT REPORTED DUE TO COLOR INTERFERENCE OF URINE PIGMENT   GLUCOSEU (A) 06/26/2022 0900    TEST NOT REPORTED  DUE TO COLOR INTERFERENCE OF URINE PIGMENT   HGBUR (A) 06/26/2022 0900    TEST NOT REPORTED DUE TO COLOR INTERFERENCE OF URINE PIGMENT   BILIRUBINUR (A) 06/26/2022 0900    TEST NOT REPORTED DUE TO COLOR INTERFERENCE OF URINE PIGMENT   KETONESUR (A) 06/26/2022 0900    TEST NOT REPORTED DUE TO COLOR INTERFERENCE OF URINE PIGMENT   PROTEINUR (A) 06/26/2022 0900    TEST NOT REPORTED DUE TO COLOR INTERFERENCE OF URINE PIGMENT   NITRITE (A) 06/26/2022 0900    TEST NOT REPORTED DUE TO COLOR INTERFERENCE OF URINE PIGMENT   LEUKOCYTESUR (A) 06/26/2022 0900    TEST NOT REPORTED DUE TO COLOR INTERFERENCE OF URINE PIGMENT   Sepsis Labs: Invalid input(s): "PROCALCITONIN", "LACTICIDVEN"  Microbiology: Recent Results (from the past 240 hour(s))  Blood culture (routine x 2)     Status: None (Preliminary result)   Collection Time: 06/26/22  8:33 AM   Specimen: BLOOD RIGHT HAND  Result Value Ref Range Status   Specimen Description BLOOD RIGHT HAND  Final   Special Requests   Final    BOTTLES DRAWN AEROBIC ONLY Blood Culture results may not be optimal due to an inadequate volume of blood received in culture bottles   Culture   Final    NO GROWTH 4 DAYS Performed at [facility], [place] [address] [place], [place] [address]    Report Status PENDING  Incomplete  Urine Culture     Status: None   Collection Time: 06/26/22  9:13 AM   Specimen: Urine, Catheterized  Result Value Ref Range Status   Specimen Description URINE, CATHETERIZED  Final   Special Requests NONE  Final   Culture   Final    NO GROWTH Performed at [facility] [address], [place], [place] [address]    Report Status 06/27/2022 FINAL  Final  MRSA Next Gen by PCR, Nasal     Status: None   Collection Time: 06/26/22  7:02 PM   Specimen: Nasal Mucosa; Nasal Swab  Result Value Ref Range Status   MRSA by PCR Next Gen NOT DETECTED NOT DETECTED Final    Comment: (NOTE) The GeneXpert MRSA Assay (FDA approved for NASAL  specimens only), is one component of a comprehensive MRSA colonization surveillance program. It is not intended to diagnose MRSA infection nor to guide or monitor treatment for MRSA infections. Test performance is not FDA approved in patients less than 76 years old. Performed at [facility], [place] [address] [place], [place] [address]   Blood culture (routine x 2)     Status: None (Preliminary result)   Collection Time: 06/26/22 10:50 PM   Specimen: BLOOD  Result Value Ref Range Status   Specimen Description   Final    BLOOD BLOOD LEFT FOREARM Performed at [facility] [address] [place], [place] [address]    Special Requests   Final    BOTTLES DRAWN AEROBIC ONLY Blood Culture adequate volume Performed at [facility] [address], [place], [place] [address]    Culture   Final    NO GROWTH 3 DAYS Performed at [facility], [place] [address] [place], [place] [address]    Report Status PENDING  Incomplete    Radiology Studies: No  results found.    Cigi Bega T. Wyano  If 7PM-7AM, please contact night-coverage [email or web address] 07/01/2022, 11:24 AM

## 2022-07-01 NOTE — Progress Notes (Signed)
Subjective/Chief Complaint:  1 - Neurogenic Bladder - chronic SPT at baseline.  2 - Gross Hematuria with Anemia / Retention - significant gross hematurai with clots on admission. ON eliquus for DVT/PE. Continuous bladder irrigation started in SPT, Out foley and Eliquus reversed. UCX 10/24, [place] 10/24 negative. CT 06/26/22 with clot in bladder, no upper tract lesions. CT 08/2021 also w/o overt GU neoplasia.  10/27 - bladder irrigation weaned to off 10/28 - urethral catheter removed, SPT reconnected to straight drain.   Today "Katelyn Lamb" is stable. No recurrent clot retention with SPT back to straight drain. She does c/o some urethral burnign after recetn urethral catheter that is now out.    Objective: Vital signs in last 24 hours: Temp:  [98 F (36.7 C)-99.1 F (37.3 C)] 98 F (36.7 C) (10/29 0345) Pulse Rate:  [92-98] 98 (10/29 0345) Resp:  [16-18] 18 (10/29 0345) BP: (99-122)/(64-76) 122/76 (10/29 0345) SpO2:  [93 %-95 %] 93 % (10/29 0345) Last BM Date : 06/30/22  Intake/Output from previous day: 10/28 0701 - 10/29 0700 In: 479.2 [P.O.:240; IV Piggyback:239.2] Out: 2975 [Urine:2975] Intake/Output this shift: No intake/output data recorded.  EXAM: NAD, Stigmata of dementia. Most history form family at bedside Non-labored breathing on RA RRR SNTND SPT in place to straight drain with medium yellow urie. No clots. Irrigated 60cc NS per pt reqeust and no clots or obstruction noted.  No palpable urethral masses/ drainage/ blood.    Lab Results:  Recent Labs    06/30/22 0921 06/30/22 1627 07/01/22 0608  WBC 11.1*  --  11.9*  HGB 7.2* 7.5* 8.1*  HCT 22.8* 23.5* 25.8*  PLT 152  --  165   BMET Recent Labs    06/29/22 0515 06/30/22 0921  NA 141 139  K 4.9 3.1*  CL 114* 109  CO2 24 25  GLUCOSE 133* 86  BUN 22 14  CREATININE 1.02* 0.78  CALCIUM 7.6* 7.8*   PT/INR No results for input(s): "LABPROT", "INR" in the last 72 hours. ABG No results for input(s):  "PHART", "HCO3" in the last 72 hours.  Invalid input(s): "PCO2", "PO2"  Studies/Results: No results found.  Anti-infectives: Anti-infectives (From admission, onward)    Start     Dose/Rate Route Frequency Ordered Stop   06/27/22 2200  cefTRIAXone (ROCEPHIN) 2 g in sodium chloride 0.9 % 100 mL IVPB        2 g 200 mL/hr over 30 Minutes Intravenous Every 24 hours 06/27/22 1356 07/01/22 0106   06/27/22 1600  acyclovir (ZOVIRAX) tablet 400 mg        400 mg Oral 2 times daily 06/27/22 1357     06/26/22 1400  meropenem (MERREM) 1 g in sodium chloride 0.9 % 100 mL IVPB  Status:  Discontinued        1 g 200 mL/hr over 30 Minutes Intravenous Every 8 hours 06/26/22 1228 06/27/22 1355   06/26/22 0915  cefTRIAXone (ROCEPHIN) 1 g in sodium chloride 0.9 % 100 mL IVPB        1 g 200 mL/hr over 30 Minutes Intravenous  Once 06/26/22 0913 06/26/22 1143       Assessment/Plan:  Hematuruia resolved. Again, source appears to have been jsut cathteer irritation for SPT in setting of bacteruria and anticoagulation. I feel will be adeuate for DC from GU perspective at anytime. Will RX pyridium for dysuria while in house (does not need to continue at [facility]).   Perhaps even consider IVC filer if requires some time off  anticaogulation given h/o PE.   Greatly appreciate hospitalist team comangment.    Alexis Frock 07/01/2022

## 2022-07-02 DIAGNOSIS — N3001 Acute cystitis with hematuria: Secondary | ICD-10-CM | POA: Diagnosis not present

## 2022-07-02 DIAGNOSIS — A419 Sepsis, unspecified organism: Secondary | ICD-10-CM | POA: Diagnosis not present

## 2022-07-02 DIAGNOSIS — I959 Hypotension, unspecified: Secondary | ICD-10-CM | POA: Diagnosis not present

## 2022-07-02 DIAGNOSIS — N390 Urinary tract infection, site not specified: Secondary | ICD-10-CM | POA: Diagnosis not present

## 2022-07-02 LAB — CBC
HCT: 28.8 % — ABNORMAL LOW (ref 36.0–46.0)
Hemoglobin: 8.6 g/dL — ABNORMAL LOW (ref 12.0–15.0)
MCH: 31.6 pg (ref 26.0–34.0)
MCHC: 29.9 g/dL — ABNORMAL LOW (ref 30.0–36.0)
MCV: 105.9 fL — ABNORMAL HIGH (ref 80.0–100.0)
Platelets: 176 10*3/uL (ref 150–400)
RBC: 2.72 MIL/uL — ABNORMAL LOW (ref 3.87–5.11)
RDW: 18.5 % — ABNORMAL HIGH (ref 11.5–15.5)
WBC: 10 10*3/uL (ref 4.0–10.5)
nRBC: 1.7 % — ABNORMAL HIGH (ref 0.0–0.2)

## 2022-07-02 LAB — GLUCOSE, CAPILLARY
Glucose-Capillary: 68 mg/dL — ABNORMAL LOW (ref 70–99)
Glucose-Capillary: 96 mg/dL (ref 70–99)
Glucose-Capillary: 166 mg/dL — ABNORMAL HIGH (ref 70–99)
Glucose-Capillary: 146 mg/dL — ABNORMAL HIGH (ref 70–99)

## 2022-07-02 LAB — RENAL FUNCTION PANEL
Albumin: 2.7 g/dL — ABNORMAL LOW (ref 3.5–5.0)
Anion gap: 5 (ref 5–15)
BUN: 8 mg/dL (ref 8–23)
CO2: 30 mmol/L (ref 22–32)
Calcium: 8.2 mg/dL — ABNORMAL LOW (ref 8.9–10.3)
Chloride: 105 mmol/L (ref 98–111)
Creatinine, Ser: 0.64 mg/dL (ref 0.44–1.00)
GFR, Estimated: >60 mL/min (ref >60)
Glucose, Bld: 85 mg/dL (ref 70–99)
Phosphorus: 3.3 mg/dL (ref 2.5–4.6)
Potassium: 4.2 mmol/L (ref 3.5–5.1)
Sodium: 140 mmol/L (ref 135–145)

## 2022-07-02 LAB — MAGNESIUM: Magnesium: 1.7 mg/dL (ref 1.7–2.4)

## 2022-07-02 LAB — CULTURE, BLOOD (ROUTINE X 2)
Culture: NO GROWTH
Special Requests: ADEQUATE

## 2022-07-02 LAB — HEPARIN LEVEL (UNFRACTIONATED): Heparin Unfractionated: 0.58 IU/mL (ref 0.30–0.70)

## 2022-07-02 MED ORDER — HEPARIN (PORCINE) 25000 UT/250ML-% IV SOLN
1100.0000 [IU]/h | INTRAVENOUS | Status: DC
  Administered 2022-07-02 – 2022-07-03 (×2): 1200 [IU]/h via INTRAVENOUS
  Filled 2022-07-02 (×2): qty 250

## 2022-07-02 NOTE — Progress Notes (Signed)
Occupational Therapy Evaluation Patient Details Name: Katelyn Lamb MRN: [id number] DOB: 1946/01/01 Today's Date: 07/02/2022   History of Present Illness Pt is a 76yo female presenting to [facility] ED on 06/26/22 with complaint of hematuria and disorientation. PMH: hx of DVT, anxiety, back pain, CKD, gait abnormality, memory difficulty, neurogenic bladder on suprapubic catheter.   Clinical Impression   Pt is from home, at baseline the only ADL she does independently is self-feeding, oral care, face washing. She requires assist for bathing, dressing, toileting, and hair maintenance. Today she continues to require mod to max A +2 for transfers. Able to demonstrate baseline for ADL - grooming, face washing. Pt mentions that she would like to continue increasing independence. AT this time recommend [place] post-acute to maximize safety and independence in ADL and functional transfers.       Recommendations for follow up therapy are one component of a multi-disciplinary discharge planning process, led by the attending physician.  Recommendations may be updated based on patient status, additional functional criteria and insurance authorization.   Follow Up Recommendations  Home health OT    Assistance Recommended at Discharge Frequent or constant Supervision/Assistance  Patient can return home with the following A lot of help with walking and/or transfers;A lot of help with bathing/dressing/bathroom;Assist for transportation;Help with stairs or ramp for entrance;Assistance with cooking/housework    Functional Status Assessment  Patient has had a recent decline in their functional status and demonstrates the ability to make significant improvements in function in a reasonable and predictable amount of time.  Equipment Recommendations  None recommended by OT (Pt has appropriate DME)    Recommendations for Other Services PT consult     Precautions / Restrictions Precautions Precautions:  Fall Restrictions Weight Bearing Restrictions: No      Mobility Bed Mobility Overal bed mobility: Needs Assistance Bed Mobility: Supine to Sit     Supine to sit: Max assist, +2 for physical assistance, +2 for safety/equipment, Mod assist     General bed mobility comments: assist with LEs and to elevate trunk. cues to self assist. bed pad utilized to complete transition to EOB    Transfers Overall transfer level: Needs assistance Equipment used: Rolling walker (2 wheels) Transfers: Sit to/from Stand, Bed to chair/wheelchair/BSC Sit to Stand: Mod assist, +2 physical assistance, +2 safety/equipment     Step pivot transfers: Max assist, +2 physical assistance, +2 safety/equipment     General transfer comment: posterior bias, sits early on the pivot      Balance Overall balance assessment: Needs assistance, History of Falls Sitting-balance support: Feet supported, Bilateral upper extremity supported Sitting balance-Leahy Scale: Fair Sitting balance - Comments: incr time to achieve midline Postural control: Posterior lean Standing balance support: During functional activity, Reliant on assistive device for balance, Bilateral upper extremity supported Standing balance-Leahy Scale: Zero Standing balance comment: reliant on UEs and external support of 2 people                           ADL either performed or assessed with clinical judgement   ADL Overall ADL's : At baseline                                       General ADL Comments: Pt can self-feed and perform face washing and oral care - otherwise at baseline she gets assist from caretakers  Vision Baseline Vision/History: 1 Wears glasses Ability to See in Adequate Light: 0 Adequate Patient Visual Report: No change from baseline       Perception     Praxis      Pertinent Vitals/Pain Pain Assessment Pain Assessment: No/denies pain     Hand Dominance Right   Extremity/Trunk  Assessment Upper Extremity Assessment Upper Extremity Assessment: Generalized weakness (decreased shoulder extension at baseline - which is why she needs assist with brushing hair)   Lower Extremity Assessment Lower Extremity Assessment: Defer to PT evaluation       Communication Communication Communication: No difficulties   Cognition Arousal/Alertness: Awake/alert Behavior During Therapy: WFL for tasks assessed/performed Overall Cognitive Status: Within Functional Limits for tasks assessed                                 General Comments: tangential at times, occasional redirection to task, LOVEs to laugh and joke around     General Comments  no family present today    Exercises     Shoulder Instructions      Home Living Family/patient expects to be discharged to:: Private residence Living Arrangements: Spouse/significant other Available Help at Discharge: Available 24 hours/day;Family;Personal care attendant Type of Home: House Home Access: Ramped entrance     Home Layout: Able to live on main level with bedroom/bathroom;Two level     Bathroom Shower/Tub: Teacher, early years/pre: Standard     Home Equipment: Tub bench;Rolling Walker (2 wheels);BSC/3in1;Grab bars - tub/shower;Wheelchair - manual;Hospital bed          Prior Functioning/Environment Prior Level of Function : Needs assist             Mobility Comments: Pt states she has not been walking, requires assist to transfer to w/c and toilet (normally able to stand-step pivot ) ADLs Comments: she requires assist for all ADL but can self-feed, brush teeth, wash face        OT Problem List: Decreased strength      OT Treatment/Interventions:      OT Goals(Current goals can be found in the care plan section) Acute Rehab OT Goals Patient Stated Goal: be more independent OT Goal Formulation: With patient Time For Goal Achievement: 07/16/22 Potential to Achieve Goals: Good   OT Frequency:      Co-evaluation              AM-PAC OT "6 Clicks" Daily Activity     Outcome Measure Help from another person eating meals?: None Help from another person taking care of personal grooming?: A Little Help from another person toileting, which includes using toliet, bedpan, or urinal?: A Lot Help from another person bathing (including washing, rinsing, drying)?: A Lot Help from another person to put on and taking off regular upper body clothing?: A Little Help from another person to put on and taking off regular lower body clothing?: A Lot 6 Click Score: 16   End of Session Equipment Utilized During Treatment: Gait belt;Rolling walker (2 wheels) Nurse Communication: Mobility status;Precautions  Activity Tolerance: Patient tolerated treatment well Patient left: in chair;with call bell/phone within reach;with chair alarm set  OT Visit Diagnosis: Muscle weakness (generalized) (M62.81)                Time: 5102-5852 OT Time Calculation (min): 24 min Charges:  OT General Charges $OT Visit: 1 Visit OT Evaluation $OT Eval Moderate Complexity: 1 Mod  OT Treatments $Self Care/Home Management : 8-22 mins  Jesse Sans OTR/L Acute Rehabilitation Services Office: [place] 07/02/2022, 1:49 PM

## 2022-07-02 NOTE — TOC Initial Note (Signed)
Transition of Care [facility] - Initial/Assessment Note    Patient Details  Name: Katelyn Lamb MRN: [id number] Date of Birth: 1946-03-06  Transition of Care [facility] CM/SW Contact:    Dessa Phi, RN Phone Number: 07/02/2022, 3:18 PM  Clinical Narrative: Will confirm w/[employer] if active w/HHRN-suprapubic cath-left message w/[employer] rep Calvin. Recc HHPT/OT.Spoke to spouse Annie Main about d/c plan-agree to [facility] services,already has care givers. Will need safe transport for home-confirmed address @ d/c.                 Expected Discharge Plan: [place] Barriers to Discharge: Continued Medical Work up   Patient Goals and CMS Choice Patient states their goals for this hospitalization and ongoing recovery are:: to go home CMS Medicare.gov Compare Post Acute Care list provided to:: Patient Choice offered to / list presented to : Adult Children  Expected Discharge Plan and Services Expected Discharge Plan: [place]   Discharge Planning Services: CM Consult Post Acute Care Choice: [place] arrangements for the past 2 months: Single Family Home                                      Prior Living Arrangements/Services Living arrangements for the past 2 months: Single Family Home Lives with:: Spouse Patient language and need for interpreter reviewed:: Yes Do you feel safe going back to the place where you live?: Yes      Need for Family Participation in Patient Care: Yes (Comment) Care giver support system in place?: Yes (comment) Current home services: Home RN (Active w/[employer] HHRN-suprapubic cath.) Criminal Activity/Legal Involvement Pertinent to Current Situation/Hospitalization: No - Comment as needed  Activities of Daily Living Home Assistive Devices/Equipment: None ADL Screening (condition at time of admission) Patient's cognitive ability adequate to safely complete daily activities?: Yes Is the patient deaf or have difficulty  hearing?: No Does the patient have difficulty seeing, even when wearing glasses/contacts?: No Does the patient have difficulty concentrating, remembering, or making decisions?: No Patient able to express need for assistance with ADLs?: Yes Does the patient have difficulty dressing or bathing?: Yes Independently performs ADLs?: No Communication: Independent Dressing (OT): Independent Grooming: Needs assistance Is this a change from baseline?: Pre-admission baseline Feeding: Independent Bathing: Needs assistance Is this a change from baseline?: Pre-admission baseline Toileting: Needs assistance Is this a change from baseline?: Pre-admission baseline In/Out Bed: Independent Walks in Home: Independent Does the patient have difficulty walking or climbing stairs?: No Weakness of Legs: None Weakness of Arms/Hands: None  Permission Sought/Granted Permission sought to share information with : Case Manager Permission granted to share information with : Yes, Verbal Permission Granted  Share Information with NAME:  (Case manager)           Emotional Assessment Appearance:: Appears stated age Attitude/Demeanor/Rapport: Gracious Affect (typically observed): Accepting Orientation: : Oriented to Self, Oriented to Place, Oriented to  Time, Oriented to Situation Alcohol / Substance Use: Not Applicable Psych Involvement: No (comment)  Admission diagnosis:  Obstruction of suprapubic catheter, initial encounter ([place]) [T83.090A] Hematuria due to acute cystitis [N30.01] Hematuria, unspecified type [R31.9] Patient Active Problem List   Diagnosis Date Noted   Hemorrhagic shock ([place]) 06/28/2022   Gross hematuria 06/27/2022   Hyperglycemia 19/75/8832   Complicated UTI (urinary tract infection) 06/27/2022   Bandemia 06/27/2022   Hematuria due to acute cystitis 06/26/2022   History of DVT (deep  vein thrombosis) 06/26/2022   Current chronic use of systemic steroids 06/26/2022   Weakness of left  upper extremity 09/15/2021   Chronic suprapubic catheter ([place]) 09/15/2021   History of pulmonary embolism 09/15/2021   Abdominal pain 08/07/2021   Elevated troponin 08/07/2021   Lactic acidosis 08/07/2021   Seizure disorder ([place]) 08/07/2021   GERD (gastroesophageal reflux disease) 08/07/2021   Platelet inhibition due to Plavix    Epistaxis s/p embolization 07-18-2021 07/17/2021   Asymptomatic bacteriuria    History of ESBL E. coli infection 07/14/2021   History of herpes encephalitis 07/14/2021   Generalized weakness 03/15/2021   Acute cystitis with hematuria 03/15/2021   Encephalopathy 03/15/2021   Recurrent falls 03/15/2021   Right pulmonary embolus ([place]) 02/12/2020   Lower extremity weakness 02/02/2020   Macrocytic anemia 02/02/2020   Spinal stenosis 02/02/2020   History of recurrent UTI (urinary tract infection)    Bacterial infection due to Klebsiella pneumoniae    Hypoxemia 12/09/2019   Laceration of left hand 12/09/2019   DNR (do not resuscitate) 12/09/2019   Pelvic fracture ([place]) 12/06/2019   AKI (acute kidney injury) ([place]) 12/06/2019   Urinary tract infection associated with catheterization of urinary tract, initial encounter ([place]) 07/23/2019   Hematuria 07/23/2019   Acute respiratory failure with hypoxia ([place]) 07/23/2019   Hypokalemia 07/23/2019   Acute on chronic anemia 07/23/2019   Fever 07/23/2019   Leukocytosis 07/23/2019   Generalized anxiety disorder 06/17/2019   Menopausal sweats 06/17/2019   Memory difficulty 03/02/2019   Degenerative spondylolisthesis 09/02/2018   Delirium 03/26/2018   Severe sepsis with septic shock ([place]) 03/26/2018   Degeneration of lumbar intervertebral disc 11/08/2017   Lumbar radiculopathy 11/06/2017   Chronic neck pain 09/12/2017   Chronic low back pain 09/06/2017   Chronic pain syndrome 09/06/2017   Dilated pancreatic duct 01/24/2017   DVT, lower extremity, distal, chronic ([place]) 01/22/2017   Elevated serum GGT level 01/22/2017    Medication monitoring encounter 01/08/2017   Neurologic gait dysfunction 11/14/2016   Hypotension    Urinary retention    Anxiety about health    Reactive depression    Ataxia    Abdominal spasms    Constipation due to pain medication    Acute lower UTI    Dysuria    Incomplete paraplegia (HCC)    Acute blood loss anemia    Neurogenic bladder    Neuropathic pain    Muscle spasm    Gastroesophageal reflux disease    Slow transit constipation    Thrombocytopenia (HCC) 10/03/2016   Abnormal MRI, spinal cord    Encephalomyelitis    Numbness    Intractable back pain 09/20/2016   Numbness of left lower extremity 09/20/2016   Hyponatremia 09/20/2016   Herpes zoster without complication 63/33/5456   Spondylosis of cervical region without myelopathy or radiculopathy 06/16/2015   PCP:  [name], MD Pharmacy:   [id number] [place] [facility] [address]Phone: [phone]: [phone]    Social Determinants of Health (SDOH) Interventions    Readmission Risk Interventions    06/29/2022    1:54 PM  Readmission Risk Prevention Plan  Transportation Screening Complete  Medication Review (RN Care Manager) Complete  [place] or [place]  SW Recovery Care/Counseling Consult Complete  Palliative Care Screening Not ALake CassidyNot Applicable

## 2022-07-02 NOTE — Progress Notes (Signed)
PROGRESS NOTE  Katelyn Lamb ZSW:[id number] DOB: June 27, 1946   PCP: [name], MD  Patient is from: Home.  Lives with husband.  Uses walker at baseline.  DOA: 06/26/2022 LOS: 6  Chief complaints Chief Complaint  Patient presents with   Hematuria     Brief Narrative / Interim history: 76 year old F with PMH of recurrent DVT/PE on Eliquis, gait dysfunction, neurogenic bladder with suprapubic cath, seizure disorder, herpes encephalitis, chronic steroid use and ESBL UTI presenting with bleeding from suprapubic catheter for 1 day with associated burning pain, and she was admitted for acute blood loss anemia due to gross hematuria and severe sepsis due to complicated UTI.  Hemoglobin dropped from 14-6.7.  Received Kcentra, and started on antibiotics and stress dose steroid.  Urology consulted.  Continuous bladder irrigation started.  Transfused 2 units with appropriate response.  Bleeding stopped. Anticoagulation resumed with IV heparin.  Could be discharged on p.o. Eliquis on 10/31 if no further bleeding.  Subjective: Seen and examined earlier this morning.  No major events overnight of this morning.  She slept well.  No complaint this morning.  Bleeding stopped.  Objective: Vitals:   07/01/22 2007 07/02/22 0500 07/02/22 0808 07/02/22 1013  BP: 129/81 130/81 118/70 108/65  Pulse:   93 (!) 103  Resp: '20 18 20 20  '$ Temp: 98 F (36.7 C) 98.3 F (36.8 C) 100.3 F (37.9 C) 99.6 F (37.6 C)  TempSrc: Oral Oral Oral Oral  SpO2: 95% 92% 95% 95%  Weight:      Height:        Examination: GENERAL: No apparent distress.  Nontoxic. HEENT: MMM.  Vision and hearing grossly intact.  NECK: Supple.  No apparent JVD.  RESP:  No IWOB.  Fair aeration bilaterally. CVS:  RRR. Heart sounds normal.  ABD/GI/GU: BS+. Abd soft, NTND.  Suprapubic Foley.  Clear looking urine. MSK/EXT:  Moves extremities. No apparent deformity. No edema.  SKIN: no apparent skin lesion or wound NEURO: Awake and alert.  Oriented fairly.  No apparent focal neuro deficit. PSYCH: Calm. Normal affect.   Procedures:  Continuous bladder irrigation  Microbiology summarized: Urine culture NGTD Blood culture NGTD MRSA PCR screen nonreactive.  Assessment and plan: Principal Problem:   Hematuria due to acute cystitis Active Problems:   Severe sepsis with septic shock (HCC)   Hypotension   Neurogenic bladder   Chronic suprapubic catheter (HCC)   History of pulmonary embolism   History of DVT (deep vein thrombosis)   Incomplete paraplegia (HCC)   Chronic pain syndrome   History of herpes encephalitis   Current chronic use of systemic steroids   DNR (do not resuscitate)   Hypoxemia   History of ESBL E. coli infection   Seizure disorder ([place])   Gross hematuria   Hyperglycemia   Complicated UTI (urinary tract infection)   Bandemia   Hemorrhagic shock ([place])  Hemorrhagic shock due to gross hematuria in patient with suprapubic catheter: Received 2 units of PRBC so far.  Hemolysis labs negative.  Iron deficient.  Bleeding seems to have stopped. Recent Labs    06/28/22 0048 06/28/22 0351 06/28/22 0749 06/29/22 0515 06/29/22 1312 06/29/22 1939 06/30/22 0921 06/30/22 1627 07/01/22 0608 07/02/22 0449  HGB 9.0* 8.3* 8.6* 6.9* 8.1* 8.0* 7.2* 7.5* 8.1* 8.6*  -Completed 5 days of IV antibiotics. -Received IV ferric gluconate 250 mg daily x2 -Monitor H&H and transfuse for Hgb <7.0.   Severe sepsis with septic shock due to UTI from suprapubic catheter: Had SIRS, lactic  acidosis and hypotension.  Sepsis physiology resolving.  Blood and urine cultures NGTD -Meropenem>> ceftriaxone.  Completed 5 days course. -Transition to p.o. prednisone 10 mg daily for 2 days then to home 5 mg -Continue midodrine 5 mg 3 times daily for BP support  Hypoxemia: Likely due to OSA, Valium and Lyrica.  Not on CPAP.  Resolved. -May have to decrease Valium and/or Lyrica  Lactic acidosis: Resolved.   Neurogenic bladder with  chronic indwelling suprapubic catheter: Complication of HSV encephalitis. -Urology on board -Agree with [place] for dysuria in-house   History of recurrent DVT/PE on chronic anticoagulation -Resumed anticoagulation with IV heparin without bolus after discussion with urology.   -Transition to home Eliquis in the morning to discharge   History of HSV encephalitis/chronic gait disorder/chronic pain syndrome and chronic steroid use -DNR/DNI. -Appreciate input by palliative  Anxiety and tremor -Continue Valium and Lyrica.    Constipation -MiraLAX and Senokot-S as needed  Bandemia: Could be due to sepsis or steroid.  Improved. -Continue monitoring  Hyperglycemia: Likely due to steroid.  A1c 5.2%. -Continue CBG monitoring and SSI while on stress dose steroid  Hypokalemia/hypophosphatemia/hypomagnesemia -Monitor and replenish as appropriate  History of hemorrhoids -Continue Tucks  Body mass index is 29.39 kg/m.    DVT prophylaxis:  Place TED hose Start: 06/26/22 0950  Code Status: DNR/DNI Family Communication: None at the bedside. Level of care: Telemetry Status is: Inpatient Remains inpatient appropriate because: Acute blood loss anemia/gross hematuria   Final disposition: Likely home with home health on 10/31. Consultants:  Urology Oncology/hematology Palliative medicine  Sch Meds:  Scheduled Meds:  acyclovir  400 mg Oral BID   Chlorhexidine Gluconate Cloth  6 each Topical Daily   diclofenac Sodium  2 g Topical QID   DULoxetine  60 mg Oral QHS   insulin aspart  0-9 Units Subcutaneous TID WC   midodrine  5 mg Oral BID WC   mirabegron ER  50 mg Oral QHS   pantoprazole  40 mg Oral QHS   phenazopyridine  200 mg Oral TID WC   [START ON 07/03/2022] predniSONE  5 mg Oral Q breakfast   pregabalin  50 mg Oral BID   QUEtiapine  50 mg Oral QHS   simethicone  80 mg Oral QID   sodium chloride flush  3 mL Intravenous Q12H   Continuous Infusions:  heparin 1,200  Units/hr (07/02/22 1137)   sodium chloride irrigation     PRN Meds:.acetaminophen **OR** acetaminophen, albuterol, diazepam, diphenhydrAMINE, HYDROcodone-acetaminophen, ondansetron (ZOFRAN) IV, polyethylene glycol, senna-docusate, witch hazel-glycerin  Antimicrobials: Anti-infectives (From admission, onward)    Start     Dose/Rate Route Frequency Ordered Stop   06/27/22 2200  cefTRIAXone (ROCEPHIN) 2 g in sodium chloride 0.9 % 100 mL IVPB        2 g 200 mL/hr over 30 Minutes Intravenous Every 24 hours 06/27/22 1356 07/01/22 0106   06/27/22 1600  acyclovir (ZOVIRAX) tablet 400 mg        400 mg Oral 2 times daily 06/27/22 1357     06/26/22 1400  meropenem (MERREM) 1 g in sodium chloride 0.9 % 100 mL IVPB  Status:  Discontinued        1 g 200 mL/hr over 30 Minutes Intravenous Every 8 hours 06/26/22 1228 06/27/22 1355   06/26/22 0915  cefTRIAXone (ROCEPHIN) 1 g in sodium chloride 0.9 % 100 mL IVPB        1 g 200 mL/hr over 30 Minutes Intravenous  Once 06/26/22 0913 06/26/22  1143        I have personally reviewed the following labs and images: CBC: Recent Labs  Lab 06/26/22 0820 06/26/22 1101 06/28/22 0351 06/28/22 0749 06/29/22 0515 06/29/22 1312 06/29/22 1939 06/30/22 0921 06/30/22 1627 07/01/22 0608 07/02/22 0449  WBC 18.7*   < > 27.6*  --  14.4* 13.1* 15.4* 11.1*  --  11.9* 10.0  NEUTROABS 10.7*  --  21.2*  --  11.8*  --   --   --   --   --   --   HGB 11.6*   < > 8.3*   < > 6.9* 8.1* 8.0* 7.2* 7.5* 8.1* 8.6*  HCT 37.9   < > 24.7*   < > 22.2* 25.3* 24.6* 22.8* 23.5* 25.8* 28.8*  MCV 105.9*   < > 94.3  --  101.4* 98.1 98.4 97.4  --  100.4* 105.9*  PLT 310   < > 199  --  177 156 161 152  --  165 176   < > = values in this interval not displayed.   BMP &GFR Recent Labs  Lab 06/28/22 0351 06/29/22 0515 06/30/22 0921 07/01/22 0608 07/02/22 0449  NA 140 141 139 143 140  K 3.4* 4.9 3.1* 3.5 4.2  CL 111 114* 109 109 105  CO2 '22 24 25 29 30  '$ GLUCOSE 125* 133* 86 101*  85  BUN 24* '22 14 10 8  '$ CREATININE 0.93 1.02* 0.78 0.70 0.64  CALCIUM 7.9* 7.6* 7.8* 8.2* 8.2*  MG 1.9 1.6* 1.7 1.8 1.7  PHOS 1.8* 4.7* 2.0* 2.8 3.3   Estimated Creatinine Clearance: 64.5 mL/min (by C-G formula based on SCr of 0.64 mg/dL). Liver & Pancreas: Recent Labs  Lab 06/28/22 0351 06/29/22 0515 06/30/22 0921 07/01/22 0608 07/02/22 0449  AST 41  --   --   --   --   ALT 35  --   --   --   --   ALKPHOS 58  --   --   --   --   BILITOT 0.5  --   --   --   --   PROT 5.4*  --   --   --   --   ALBUMIN 2.9* 2.7* 2.4* 2.7* 2.7*   No results for input(s): "LIPASE", "AMYLASE" in the last 168 hours. No results for input(s): "AMMONIA" in the last 168 hours. Diabetic: No results for input(s): "HGBA1C" in the last 72 hours.  Recent Labs  Lab 07/01/22 1142 07/01/22 1650 07/01/22 2143 07/02/22 0736 07/02/22 1150  GLUCAP 109* 144* 122* 68* 96   Cardiac Enzymes: No results for input(s): "CKTOTAL", "CKMB", "CKMBINDEX", "TROPONINI" in the last 168 hours. No results for input(s): "PROBNP" in the last 8760 hours. Coagulation Profile: Recent Labs  Lab 06/26/22 0900 06/28/22 0351  INR 1.5* 1.1   Thyroid Function Tests: No results for input(s): "TSH", "T4TOTAL", "FREET4", "T3FREE", "THYROIDAB" in the last 72 hours. Lipid Profile: No results for input(s): "CHOL", "HDL", "LDLCALC", "TRIG", "CHOLHDL", "LDLDIRECT" in the last 72 hours. Anemia Panel: No results for input(s): "VITAMINB12", "FOLATE", "FERRITIN", "TIBC", "IRON", "RETICCTPCT" in the last 72 hours.  Urine analysis:    Component Value Date/Time   COLORURINE RED (A) 06/26/2022 0900   APPEARANCEUR TURBID (A) 06/26/2022 0900   LABSPEC  06/26/2022 0900    TEST NOT REPORTED DUE TO COLOR INTERFERENCE OF URINE PIGMENT   PHURINE  06/26/2022 0900    TEST NOT REPORTED DUE TO COLOR INTERFERENCE OF URINE PIGMENT   GLUCOSEU (  A) 06/26/2022 0900    TEST NOT REPORTED DUE TO COLOR INTERFERENCE OF URINE PIGMENT   HGBUR (A) 06/26/2022  0900    TEST NOT REPORTED DUE TO COLOR INTERFERENCE OF URINE PIGMENT   BILIRUBINUR (A) 06/26/2022 0900    TEST NOT REPORTED DUE TO COLOR INTERFERENCE OF URINE PIGMENT   KETONESUR (A) 06/26/2022 0900    TEST NOT REPORTED DUE TO COLOR INTERFERENCE OF URINE PIGMENT   PROTEINUR (A) 06/26/2022 0900    TEST NOT REPORTED DUE TO COLOR INTERFERENCE OF URINE PIGMENT   NITRITE (A) 06/26/2022 0900    TEST NOT REPORTED DUE TO COLOR INTERFERENCE OF URINE PIGMENT   LEUKOCYTESUR (A) 06/26/2022 0900    TEST NOT REPORTED DUE TO COLOR INTERFERENCE OF URINE PIGMENT   Sepsis Labs: Invalid input(s): "PROCALCITONIN", "LACTICIDVEN"  Microbiology: Recent Results (from the past 240 hour(s))  Blood culture (routine x 2)     Status: None   Collection Time: 06/26/22  8:33 AM   Specimen: BLOOD RIGHT HAND  Result Value Ref Range Status   Specimen Description BLOOD RIGHT HAND  Final   Special Requests   Final    BOTTLES DRAWN AEROBIC ONLY Blood Culture results may not be optimal due to an inadequate volume of blood received in culture bottles   Culture   Final    NO GROWTH 5 DAYS Performed at [facility], [place] [address] [place], [place] [address]    Report Status 07/01/2022 FINAL  Final  Urine Culture     Status: None   Collection Time: 06/26/22  9:13 AM   Specimen: Urine, Catheterized  Result Value Ref Range Status   Specimen Description URINE, CATHETERIZED  Final   Special Requests NONE  Final   Culture   Final    NO GROWTH Performed at [facility] [address], [place], [place] [address]    Report Status 06/27/2022 FINAL  Final  MRSA Next Gen by PCR, Nasal     Status: None   Collection Time: 06/26/22  7:02 PM   Specimen: Nasal Mucosa; Nasal Swab  Result Value Ref Range Status   MRSA by PCR Next Gen NOT DETECTED NOT DETECTED Final    Comment: (NOTE) The GeneXpert MRSA Assay (FDA approved for NASAL specimens only), is one component of a comprehensive MRSA colonization  surveillance program. It is not intended to diagnose MRSA infection nor to guide or monitor treatment for MRSA infections. Test performance is not FDA approved in patients less than 73 years old. Performed at [facility], [place] [address] [place], [place] [address]   Blood culture (routine x 2)     Status: None   Collection Time: 06/26/22 10:50 PM   Specimen: BLOOD  Result Value Ref Range Status   Specimen Description   Final    BLOOD BLOOD LEFT FOREARM Performed at [facility] [address] [place], [place] [address]    Special Requests   Final    BOTTLES DRAWN AEROBIC ONLY Blood Culture adequate volume Performed at [facility] [address], [place], [place] [address]    Culture   Final    NO GROWTH 5 DAYS Performed at [facility], [place] [address], [place], [place] [address]    Report Status 07/02/2022 FINAL  Final    Radiology Studies: No results found.    Fatima Fedie T. [place]  If 7PM-7AM, please contact night-coverage [email or web address] 07/02/2022, 1:09 PM

## 2022-07-02 NOTE — Progress Notes (Signed)
ANTICOAGULATION CONSULT NOTE - follow up  Pharmacy Consult for Heparin Indication: Hx recurrent DVT/PE, apixaban held  Allergies  Allergen Reactions   Demerol [Meperidine] Other (See Comments)    Hallucinations   Hydrocodone-Acetaminophen Other (See Comments)   Percocet [Oxycodone-Acetaminophen] Itching   Amoxicillin-Pot Clavulanate Diarrhea    Severe pain, headache, intestinal infection   Penicillins Itching and Rash    Tolerated amoxicillin November 2022  Has patient had a PCN reaction causing immediate rash, facial/tongue/throat swelling, SOB or lightheadedness with hypotension:  NO Has patient had a PCN reaction causing severe rash involving mucus membranes or skin necrosis: No Has patient had a PCN reaction that required hospitalization: No Has patient had a PCN reaction occurring within the last 10 years: Yes If all of the above answers are "NO", then may proceed with Cephalosporin use.    Patient Measurements: Height: '5\' 5"'$  (165.1 cm) Weight: 80.1 kg (176 lb 9.4 oz) IBW/kg (Calculated) : 57 Heparin Dosing Weight: 74 kg  Vital Signs: Temp: 98.5 F (36.9 C) (10/30 2012) Temp Source: Oral (10/30 2012) BP: 124/72 (10/30 2012) Pulse Rate: 105 (10/30 2012)  Labs: Recent Labs    06/30/22 0921 06/30/22 1627 07/01/22 0608 07/02/22 0449 07/02/22 2054  HGB 7.2* 7.5* 8.1* 8.6*  --   HCT 22.8* 23.5* 25.8* 28.8*  --   PLT 152  --  165 176  --   HEPARINUNFRC  --   --   --   --  0.58  CREATININE 0.78  --  0.70 0.64  --      Estimated Creatinine Clearance: 64.5 mL/min (by C-G formula based on SCr of 0.64 mg/dL).   Medical History: Past Medical History:  Diagnosis Date   Acute deep vein thrombosis (DVT) of popliteal vein of left lower extremity (HCC)    Anxiety    Back pain    Chronic kidney disease    Encephalomyelitis    Gait abnormality 11/14/2016   Memory difficulty 03/02/2019   Myelitis due to herpes simplex (HCC)    Neurogenic bladder     Medications:   Scheduled:   acyclovir  400 mg Oral BID   Chlorhexidine Gluconate Cloth  6 each Topical Daily   diclofenac Sodium  2 g Topical QID   DULoxetine  60 mg Oral QHS   insulin aspart  0-9 Units Subcutaneous TID WC   midodrine  5 mg Oral BID WC   mirabegron ER  50 mg Oral QHS   pantoprazole  40 mg Oral QHS   phenazopyridine  200 mg Oral TID WC   [START ON 07/03/2022] predniSONE  5 mg Oral Q breakfast   pregabalin  50 mg Oral BID   QUEtiapine  50 mg Oral QHS   simethicone  80 mg Oral QID   sodium chloride flush  3 mL Intravenous Q12H   Infusions:   heparin 1,200 Units/hr (07/02/22 1551)   sodium chloride irrigation      Assessment: 49 yoF admitted 10/24 with gross hematuria related to suprapubic catheter.  Prior to admission Apixaban for recurrent DVT/PE was held.  Bleeding has now stopped s/p KCentra on 10/24, PRBC, Ferrlecit 10/27-28, continuous bladder irrigation.  Pharmacy has been  consulted to start Heparin IV without bolus on 10/30.  Last anticoag: Prior to admission apixaban '5mg'$  BID.  Last dose on 10/23 at 2200.   Today, 07/02/2022:  CBC: Hgb 8.6, Plt WNL  1st HL 0.58, therapeutic on 1200 units/hr No bleeding per RN   Goal of Therapy:  Heparin  level 0.3-0.7 units/ml Monitor platelets by anticoagulation protocol: Yes   Plan:  No bolus  Continue  heparin IV infusion at 1200 units/hr Confirmatory level with am labs Daily heparin level and CBC Monitor for s/s bleeding, hematuria Follow up long-term anticoagulation plans  Dolly Rias RPh 07/02/2022, 9:48 PM

## 2022-07-02 NOTE — Progress Notes (Signed)
ANTICOAGULATION CONSULT NOTE - Initial Consult  Pharmacy Consult for Heparin Indication: Hx recurrent DVT/PE, apixaban held  Allergies  Allergen Reactions   Demerol [Meperidine] Other (See Comments)    Hallucinations   Hydrocodone-Acetaminophen Other (See Comments)   Percocet [Oxycodone-Acetaminophen] Itching   Amoxicillin-Pot Clavulanate Diarrhea    Severe pain, headache, intestinal infection   Penicillins Itching and Rash    Tolerated amoxicillin November 2022  Has patient had a PCN reaction causing immediate rash, facial/tongue/throat swelling, SOB or lightheadedness with hypotension:  NO Has patient had a PCN reaction causing severe rash involving mucus membranes or skin necrosis: No Has patient had a PCN reaction that required hospitalization: No Has patient had a PCN reaction occurring within the last 10 years: Yes If all of the above answers are "NO", then may proceed with Cephalosporin use.    Patient Measurements: Height: '5\' 5"'$  (165.1 cm) Weight: 80.1 kg (176 lb 9.4 oz) IBW/kg (Calculated) : 57 Heparin Dosing Weight: 74 kg  Vital Signs: Temp: 100.3 F (37.9 C) (10/30 0808) Temp Source: Oral (10/30 0808) BP: 118/70 (10/30 0808) Pulse Rate: 93 (10/30 0808)  Labs: Recent Labs    06/30/22 0921 06/30/22 1627 07/01/22 0608 07/02/22 0449  HGB 7.2* 7.5* 8.1* 8.6*  HCT 22.8* 23.5* 25.8* 28.8*  PLT 152  --  165 176  CREATININE 0.78  --  0.70 0.64    Estimated Creatinine Clearance: 64.5 mL/min (by C-G formula based on SCr of 0.64 mg/dL).   Medical History: Past Medical History:  Diagnosis Date   Acute deep vein thrombosis (DVT) of popliteal vein of left lower extremity (HCC)    Anxiety    Back pain    Chronic kidney disease    Encephalomyelitis    Gait abnormality 11/14/2016   Memory difficulty 03/02/2019   Myelitis due to herpes simplex (HCC)    Neurogenic bladder     Medications:  Scheduled:   acyclovir  400 mg Oral BID   Chlorhexidine Gluconate  Cloth  6 each Topical Daily   diclofenac Sodium  2 g Topical QID   DULoxetine  60 mg Oral QHS   insulin aspart  0-9 Units Subcutaneous TID WC   midodrine  5 mg Oral BID WC   mirabegron ER  50 mg Oral QHS   pantoprazole  40 mg Oral QHS   phenazopyridine  200 mg Oral TID WC   predniSONE  10 mg Oral Q breakfast   Followed by   Derrill Memo ON 07/03/2022] predniSONE  5 mg Oral Q breakfast   pregabalin  50 mg Oral BID   QUEtiapine  50 mg Oral QHS   simethicone  80 mg Oral QID   sodium chloride flush  3 mL Intravenous Q12H   Infusions:   sodium chloride irrigation      Assessment: 27 yoF admitted 10/24 with gross hematuria related to suprapubic catheter.  Prior to admission Apixaban for recurrent DVT/PE was held.  Bleeding has now stopped s/p KCentra on 10/24, PRBC, Ferrlecit 10/27-28, continuous bladder irrigation.  Pharmacy has been  consulted to start Heparin IV without bolus on 10/30.  Last anticoag: Prior to admission apixaban '5mg'$  BID.  Last dose on 10/23 at 2200.   Today, 07/02/2022:  CBC: Hgb 8.6, Plt WNL No bleeding reported today    Goal of Therapy:  Heparin level 0.3-0.7 units/ml Monitor platelets by anticoagulation protocol: Yes   Plan:  No bolus  Start heparin IV infusion at 1200 units/hr Heparin level 8 hours after starting Daily heparin  level and CBC Monitor for s/s bleeding, hematuria Follow up long-term anticoagulation plans   Gretta Arab PharmD, BCPS Clinical Pharmacist [facility] main pharmacy [phone] 07/02/2022 8:36 AM

## 2022-07-02 NOTE — Progress Notes (Signed)
  Subjective: No acute events. Katelyn Chiu reports she is feeling well today.  CBI titrated off over the weekend and urine clear/yellow from SP tubve  Blood counts stable today  Objective: Vital signs in last 24 hours: Temp:  [98 F (36.7 C)-100.3 F (37.9 C)] 99.1 F (37.3 C) (10/30 1352) Pulse Rate:  [93-103] 102 (10/30 1352) Resp:  [18-20] 20 (10/30 1352) BP: (108-130)/(65-89) 129/89 (10/30 1352) SpO2:  [92 %-95 %] 95 % (10/30 1352)  Intake/Output from previous day: 10/29 0701 - 10/30 0700 In: -  Out: 4097 [Urine:3375] Intake/Output this shift: Total I/O In: 290.6 [P.O.:240; I.V.:50.6] Out: 1900 [Urine:1900]  Physical Exam:  General: Alert and oriented CV: RRR Lungs: Clear Abdomen: Soft, ND, ATTP Ext: NT, No erythema Urine clear/yellow  Lab Results: Recent Labs    06/30/22 1627 07/01/22 0608 07/02/22 0449  HGB 7.5* 8.1* 8.6*  HCT 23.5* 25.8* 28.8*   BMET Recent Labs    07/01/22 0608 07/02/22 0449  NA 143 140  K 3.5 4.2  CL 109 105  CO2 29 30  GLUCOSE 101* 85  BUN 10 8  CREATININE 0.70 0.64  CALCIUM 8.2* 8.2*     Studies/Results: No results found.  Assessment/Plan: Katelyn Lamb is a 76 yo admitted to the hospital with gross hematuria. CBI titrated off over the weekend and urine looks good today  -dispo per primary team -Will set up Katelyn Lamb for gross hematuria work up as outpatient    LOS: 6 days   Donald Pore MD 07/02/2022, [facility]  Pager: [phone]

## 2022-07-02 NOTE — Care Management Important Message (Signed)
Important Message  Patient Details IM Letter given Name: Katelyn Lamb MRN: [id number] Date of Birth: 12/08/1945   Medicare Important Message Given:  Yes     [name] 07/02/2022, 12:38 PM

## 2022-07-02 NOTE — Progress Notes (Signed)
  Daily Progress Note   Patient Name: Katelyn Lamb       Date: 07/02/2022 DOB: 1946-03-10  Age: 76 y.o. MRN#: [id number] Attending Physician: Mercy Riding, MD Primary Care Physician: [name], MD Admit Date: 06/26/2022 Length of Stay: 6 days  Palliative team consulted on 10/25 and aptient had been seen by palliative provider in ED on 10/24. Family expressed disagreement with palliative care team's involvement at that time. As family has expressed reluctance to palliative team's involvement, our team will respectfully sign off at this time. Please reach out if our team can be of further assistance in the future and family open to visit from PMT. Thank you for involving our team in patient's care.    Chelsea Aus, DO Palliative Care Provider PMT # [phone]

## 2022-07-03 ENCOUNTER — Other Ambulatory Visit: Payer: Self-pay | Admitting: Neurology

## 2022-07-03 DIAGNOSIS — G40909 Epilepsy, unspecified, not intractable, without status epilepticus: Secondary | ICD-10-CM | POA: Diagnosis not present

## 2022-07-03 DIAGNOSIS — R739 Hyperglycemia, unspecified: Secondary | ICD-10-CM | POA: Diagnosis not present

## 2022-07-03 DIAGNOSIS — N3001 Acute cystitis with hematuria: Secondary | ICD-10-CM | POA: Diagnosis not present

## 2022-07-03 DIAGNOSIS — R0902 Hypoxemia: Secondary | ICD-10-CM | POA: Diagnosis not present

## 2022-07-03 DIAGNOSIS — G8222 Paraplegia, incomplete: Secondary | ICD-10-CM

## 2022-07-03 DIAGNOSIS — G0490 Encephalitis and encephalomyelitis, unspecified: Secondary | ICD-10-CM

## 2022-07-03 LAB — CBC
HCT: 29.5 % — ABNORMAL LOW (ref 36.0–46.0)
Hemoglobin: 9.1 g/dL — ABNORMAL LOW (ref 12.0–15.0)
MCH: 31.1 pg (ref 26.0–34.0)
MCHC: 30.8 g/dL (ref 30.0–36.0)
MCV: 100.7 fL — ABNORMAL HIGH (ref 80.0–100.0)
Platelets: 197 10*3/uL (ref 150–400)
RBC: 2.93 MIL/uL — ABNORMAL LOW (ref 3.87–5.11)
RDW: 18.5 % — ABNORMAL HIGH (ref 11.5–15.5)
WBC: 11.3 10*3/uL — ABNORMAL HIGH (ref 4.0–10.5)
nRBC: 0.6 % — ABNORMAL HIGH (ref 0.0–0.2)

## 2022-07-03 LAB — GLUCOSE, CAPILLARY
Glucose-Capillary: 76 mg/dL (ref 70–99)
Glucose-Capillary: 103 mg/dL — ABNORMAL HIGH (ref 70–99)

## 2022-07-03 LAB — HEPARIN LEVEL (UNFRACTIONATED): Heparin Unfractionated: 0.78 IU/mL — ABNORMAL HIGH (ref 0.30–0.70)

## 2022-07-03 MED ORDER — MIDODRINE HCL 5 MG PO TABS: 5.0000 mg | ORAL_TABLET | Freq: Two times a day (BID) | ORAL | 0 refills | Status: DC

## 2022-07-03 MED ORDER — APIXABAN 5 MG PO TABS
5.0000 mg | ORAL_TABLET | Freq: Two times a day (BID) | ORAL | Status: DC
  Administered 2022-07-03: 5 mg via ORAL
  Filled 2022-07-03: qty 1

## 2022-07-03 NOTE — Discharge Summary (Signed)
Physician Discharge Summary  Katelyn Lamb JAS:[id number] DOB: 1946-02-11 DOA: 06/26/2022  PCP: [name], MD  Admit date: 06/26/2022 Discharge date: 07/03/2022 Admitted From: Home Disposition: Home Recommendations for Outpatient Follow-up:  Follow up with PCP in 1 to 2 weeks Urology to arrange outpatient follow-up Check CBC and CMP in 1 to 2 weeks Please follow up on the following pending results: None  Home Health: PT/OT Equipment/Devices: Patient has appropriate DME  Discharge Condition: Stable CODE STATUS: DNR/DNI  Follow-up Information     Koirala, Dibas, MD. Schedule an appointment as soon as possible for a visit in 1 week(s).   Specialty: Family Medicine Contact information: [place] [address] [place], [place] Follow up.   Specialty: Home Health Services Why: [facility] nursing/physical/occupational therapy Contact information: [place] [id number] [place] [facility] course 76 year old F with PMH of recurrent DVT/PE on Eliquis, gait dysfunction, neurogenic bladder with suprapubic cath, seizure disorder, herpes encephalitis, chronic steroid use and ESBL UTI presenting with bleeding from suprapubic catheter for 1 day with associated burning pain, and she was admitted for acute blood loss anemia due to gross hematuria and severe sepsis due to complicated UTI.  Hemoglobin dropped from 14-6.7.  Received Kcentra, and started on antibiotics and stress dose steroid.  Urology consulted.  Continuous bladder irrigation started.  Transfused 2 units with appropriate response.  Bleeding stopped. Anticoagulation resumed with IV heparin.  Hemoglobin remained stable.  She is discharged on home Eliquis for outpatient follow-up with PCP and urology. Patient completed antibiotic course in-house for possible UTI.   See individual problem list below for more.    Problems addressed during this hospitalization Principal Problem:   Hematuria due to acute cystitis Active Problems:   Severe sepsis with septic shock (HCC)   Hypotension   Neurogenic bladder   Chronic suprapubic catheter (HCC)   History of pulmonary embolism   History of DVT (deep vein thrombosis)   Incomplete paraplegia (HCC)   Chronic pain syndrome   History of herpes encephalitis   Current chronic use of systemic steroids   DNR (do not resuscitate)   Hypoxemia   History of ESBL E. coli infection   Seizure disorder ([place])   Gross hematuria   Hyperglycemia   Complicated UTI (urinary tract infection)   Bandemia   Hemorrhagic shock (HCC)   Vital signs Vitals:   07/02/22 1352 07/02/22 2012 07/03/22 0522 07/03/22 1145  BP: 129/89 124/72 117/74 104/63  Pulse: (!) 102 (!) 105 87 95  Temp: 99.1 F (37.3 C) 98.5 F (36.9 C) 98.5 F (36.9 C)   Resp: '20 17  18  '$ Height:      Weight:      SpO2: 95% 94% 93% 95%  TempSrc: Oral Oral Oral   BMI (Calculated):         Discharge exam  GENERAL: No apparent distress.  Nontoxic. HEENT: MMM.  Vision and hearing grossly intact.  NECK: Supple.  No apparent JVD.  RESP:  No IWOB.  Fair aeration bilaterally. CVS:  RRR. Heart sounds normal.  ABD/GI/GU: BS+. Abd soft, NTND.  Suprapubic cath.  Clear looking urine. MSK/EXT:  Moves extremities. No apparent deformity. No edema.  SKIN: no apparent skin lesion or wound NEURO: Awake and alert.  Oriented fairly.  No apparent focal neuro  deficit. PSYCH: Calm. Normal affect.   Discharge Instructions Discharge Instructions     Diet general   Complete by: As directed    Discharge instructions   Complete by: As directed    It has been a pleasure taking care of you!  You were hospitalized due to bleeding and possible urinary tract infection for which you have been treated with blood transfusion and antibiotics.  Your bleeding seems to have stopped.  Follow-up with your primary care doctor in  1 to 2 weeks or sooner if needed.  Follow-up with urology per their recommendation.   Take care,   Increase activity slowly   Complete by: As directed       Allergies as of 07/03/2022       Reactions   Demerol [meperidine] Other (See Comments)   Hallucinations   Hydrocodone-acetaminophen Other (See Comments)   Percocet [oxycodone-acetaminophen] Itching   Amoxicillin-pot Clavulanate Diarrhea   Severe pain, headache, intestinal infection   Penicillins Itching, Rash   Tolerated amoxicillin November 2022  Has patient had a PCN reaction causing immediate rash, facial/tongue/throat swelling, SOB or lightheadedness with hypotension:  NO Has patient had a PCN reaction causing severe rash involving mucus membranes or skin necrosis: No Has patient had a PCN reaction that required hospitalization: No Has patient had a PCN reaction occurring within the last 10 years: Yes If all of the above answers are "NO", then may proceed with Cephalosporin use.        Medication List     TAKE these medications    acyclovir 400 MG tablet Commonly known as: ZOVIRAX TAKE 1 TABLET BY MOUTH 2 TIMES DAILY. PLEASE CALL AND MAKE OVERDUE APPT FOR FURTHER REFILLS What changed: See the new instructions.   albuterol (2.5 MG/3ML) 0.083% nebulizer solution Commonly known as: PROVENTIL Take 3 mLs (2.5 mg total) by nebulization every 2 (two) hours as needed for wheezing or shortness of breath.   apixaban 5 MG Tabs tablet Commonly known as: ELIQUIS Take 1 tablet (5 mg total) by mouth 2 (two) times daily.   CALCIUM 600 + D PO Take 1 tablet by mouth in the morning and at bedtime.   diazepam 5 MG tablet Commonly known as: VALIUM TAKE 1 TABLET BY MOUTH EVERY 12 HOURS AS NEEDED FOR TREMORS   DULoxetine 60 MG capsule Commonly known as: CYMBALTA TAKE 1 CAPSULE (60 MG TOTAL) BY MOUTH AT BEDTIME.   feeding supplement Liqd Take 237 mLs by mouth 2 (two) times daily between meals.   ferrous sulfate 325 (65  FE) MG tablet Take 325 mg by mouth every morning.   midodrine 5 MG tablet Commonly known as: PROAMATINE Take 1 tablet (5 mg total) by mouth 2 (two) times daily with a meal. What changed: when to take this   mirabegron ER 50 MG Tb24 tablet Commonly known as: MYRBETRIQ Take 50 mg by mouth at bedtime.   omega-3 acid ethyl esters 1 g capsule Commonly known as: LOVAZA Take 1 g by mouth every morning.   pantoprazole 40 MG tablet Commonly known as: PROTONIX Take 1 tablet (40 mg total) by mouth daily.   predniSONE 5 MG tablet Commonly known as: DELTASONE Take 1 tablet (5 mg total) by mouth daily with breakfast.   pregabalin 50 MG capsule Commonly known as: LYRICA Take 1 capsule (50 mg total) by mouth 2 (two) times daily.   QUEtiapine 50 MG tablet Commonly known as: SEROQUEL Take 1 tablet (50 mg total) by mouth at bedtime.  VITAMIN B-12 PO Take 500 mcg by mouth daily.   Vitamin C 500 MG Chew Chew 500 mg by mouth daily.        Consultations: Urology Hematology  Procedures/Studies:   VAS [place] LOWER EXTREMITY VENOUS (DVT)  Result Date: 06/27/2022  Lower Venous DVT Study Patient Name:  LAURIANN MILILLO  Date of Exam:   06/27/2022 Medical Rec #: [id number]     Accession #:    [id number] Date of Birth: [birth year]      Patient Gender: F Patient Age:   76 years Exam Location:  [facility] Procedure:      VAS [place] LOWER EXTREMITY VENOUS (DVT) Referring Phys: Fuller Plan --------------------------------------------------------------------------------  Indications: Pulmonary embolism.  Limitations: Body habitus, poor ultrasound/tissue interface and patient position, muscle contracture. Comparison Study: 08/07/2021 Bilateral lower extremity venous duplex- acute DVT                   involving right peroneal veins, no evidence of left lower                   extremity DVT. Performing Technologist: Maudry Mayhew MHA, RDMS, RVT, RDCS  Examination Guidelines: A complete evaluation  includes B-mode imaging, spectral Doppler, color Doppler, and power Doppler as needed of all accessible portions of each vessel. Bilateral testing is considered an integral part of a complete examination. Limited examinations for reoccurring indications may be performed as noted. The reflux portion of the exam is performed with the patient in reverse Trendelenburg.  +---------+---------------+---------+-----------+---------------+--------------+ RIGHT    CompressibilityPhasicitySpontaneityProperties     Thrombus Aging +---------+---------------+---------+-----------+---------------+--------------+ CFV      Full           Yes      Yes                                      +---------+---------------+---------+-----------+---------------+--------------+ SFJ      Full                                                             +---------+---------------+---------+-----------+---------------+--------------+ FV Prox  Full                                                             +---------+---------------+---------+-----------+---------------+--------------+ FV Mid   Full                                                             +---------+---------------+---------+-----------+---------------+--------------+ FV DistalFull                                                             +---------+---------------+---------+-----------+---------------+--------------+ PFV  Full                                                             +---------+---------------+---------+-----------+---------------+--------------+ POP      Full           Yes      Yes                                      +---------+---------------+---------+-----------+---------------+--------------+ PTV                              Yes        patent by color                                                           Doppler                        +---------+---------------+---------+-----------+---------------+--------------+   Right Technical Findings: Not visualized segments include limited visualization peroneal veins.  +---------+---------------+---------+-----------+----------+--------------+ LEFT     CompressibilityPhasicitySpontaneityPropertiesThrombus Aging +---------+---------------+---------+-----------+----------+--------------+ CFV      Full           Yes      Yes                                 +---------+---------------+---------+-----------+----------+--------------+ SFJ      Full                                                        +---------+---------------+---------+-----------+----------+--------------+ FV Prox  Full                                                        +---------+---------------+---------+-----------+----------+--------------+ FV Mid   Full                                                        +---------+---------------+---------+-----------+----------+--------------+ FV DistalFull                                                        +---------+---------------+---------+-----------+----------+--------------+ PFV      Full                                                        +---------+---------------+---------+-----------+----------+--------------+  POP      Full           Yes      Yes                                 +---------+---------------+---------+-----------+----------+--------------+   Left Technical Findings: Not visualized segments include PTV, peroneal veins.   Summary: RIGHT: - There is no evidence of deep vein thrombosis in the lower extremity. However, portions of this examination were limited- see technologist comments above.  - No cystic structure found in the popliteal fossa.  LEFT: - There is no evidence of deep vein thrombosis in the lower extremity. However, portions of this examination were limited- see technologist comments above.  - No  cystic structure found in the popliteal fossa.  *See table(s) above for measurements and observations. Electronically signed by Harold Barban MD on 06/27/2022 at 10:35:15 PM.    Final    CT ABDOMEN PELVIS WO CONTRAST  Result Date: 06/26/2022 CLINICAL DATA:  Gross hematuria, has suprapubic catheter, had red streaks and collection bag yesterday, today frank red urine, on Eliquis, golf ball sized clots EXAM: CT ABDOMEN AND PELVIS WITHOUT CONTRAST TECHNIQUE: Multidetector CT imaging of the abdomen and pelvis was performed following the standard protocol without IV contrast. RADIATION DOSE REDUCTION: This exam was performed according to the departmental dose-optimization program which includes automated exposure control, adjustment of the mA and/or kV according to patient size and/or use of iterative reconstruction technique. COMPARISON:  08/07/2021 FINDINGS: Lower chest: Lung bases clear Hepatobiliary: Gallbladder and liver normal appearance. No biliary dilatation. Pancreas: Normal appearance Spleen: Normal appearance Adrenals/Urinary Tract: Adrenal glands normal appearance. Tiny nonobstructing calculus at inferior pole LEFT kidney. Kidneys otherwise normal appearance without mass or hydronephrosis. Slightly increased attenuation of the decompressed RIGHT renal pelvis and proximal RIGHT ureter, cannot exclude blood. LEFT ureter is low-attenuation. Foley catheter and suprapubic catheter balloons within urinary bladder. Significant bladder wall thickening. High attenuation material within bladder lumen consistent with blood. Minimal air within bladder lumen. No focal bladder mass identified though a bladder mass could be obscured by the blood and catheters. Stomach/Bowel: Minimal distal colonic diverticulosis without evidence of diverticulitis. Stool RIGHT colon. Appendix normal caliber with high attenuation material versus appendicolith in distal lumen. Stomach and remaining bowel loops normal appearance.  Vascular/Lymphatic: Atherosclerotic calcifications aorta and iliac arteries without aneurysm Reproductive: Uterus surgically absent. Nonvisualization of ovaries. Other: Tiny umbilical hernia containing fat. No free air or free fluid. No inflammatory process. Musculoskeletal: Osseous demineralization. Old inferior LEFT pubic ramus fracture deformity. Prior L5 and L2 spinal augmentation procedures. Superior endplate height losses of multiple thoracic and lumbar vertebra. IMPRESSION: Significant bladder wall thickening with high attenuation material within bladder lumen consistent with blood; no definite bladder mass identified though a bladder mass could be obscured by the blood and catheters. Slightly increased attenuation of the decompressed RIGHT renal pelvis and proximal RIGHT ureter, question hematuria. Tiny nonobstructing LEFT renal calculus. Minimal distal colonic diverticulosis without evidence of diverticulitis. Tiny umbilical hernia containing fat. Aortic Atherosclerosis (ICD10-I70.0). Electronically Signed   By: Lavonia Dana M.D.   On: 06/26/2022 09:41       The results of significant diagnostics from this hospitalization (including imaging, microbiology, ancillary and laboratory) are listed below for reference.     Microbiology: Recent Results (from the past 240 hour(s))  Blood culture (routine x 2)     Status: None   Collection Time: 06/26/22  8:33 AM   Specimen: BLOOD RIGHT HAND  Result Value Ref Range Status   Specimen Description BLOOD RIGHT HAND  Final   Special Requests   Final    BOTTLES DRAWN AEROBIC ONLY Blood Culture results may not be optimal due to an inadequate volume of blood received in culture bottles   Culture   Final    NO GROWTH 5 DAYS Performed at [facility], [place] [address] [place], [place] [address]    Report Status 07/01/2022 FINAL  Final  Urine Culture     Status: None   Collection Time: 06/26/22  9:13 AM   Specimen: Urine, Catheterized  Result Value  Ref Range Status   Specimen Description URINE, CATHETERIZED  Final   Special Requests NONE  Final   Culture   Final    NO GROWTH Performed at [place] [address] [place], [place] [address]    Report Status 06/27/2022 FINAL  Final  MRSA Next Gen by PCR, Nasal     Status: None   Collection Time: 06/26/22  7:02 PM   Specimen: Nasal Mucosa; Nasal Swab  Result Value Ref Range Status   MRSA by PCR Next Gen NOT DETECTED NOT DETECTED Final    Comment: (NOTE) The GeneXpert MRSA Assay (FDA approved for NASAL specimens only), is one component of a comprehensive MRSA colonization surveillance program. It is not intended to diagnose MRSA infection nor to guide or monitor treatment for MRSA infections. Test performance is not FDA approved in patients less than 42 years old. Performed at [facility], [place] [address] [place], [place] [address]   Blood culture (routine x 2)     Status: None   Collection Time: 06/26/22 10:50 PM   Specimen: BLOOD  Result Value Ref Range Status   Specimen Description   Final    BLOOD BLOOD LEFT FOREARM Performed at [place] [address], [place], [place] [address]    Special Requests   Final    BOTTLES DRAWN AEROBIC ONLY Blood Culture adequate volume Performed at [place] [address] [place], [place] [address]    Culture   Final    NO GROWTH 5 DAYS Performed at [facility], [place] [address] [place], [place] [address]    Report Status 07/02/2022 FINAL  Final     Labs:  CBC: Recent Labs  Lab 06/28/22 0351 06/28/22 0749 06/29/22 0515 06/29/22 1312 06/29/22 1939 06/30/22 0921 06/30/22 1627 07/01/22 0608 07/02/22 0449 07/03/22 0509  WBC 27.6*  --  14.4*   < > 15.4* 11.1*  --  11.9* 10.0 11.3*  NEUTROABS 21.2*  --  11.8*  --   --   --   --   --   --   --   HGB 8.3*   < > 6.9*   < > 8.0* 7.2* 7.5* 8.1* 8.6* 9.1*  HCT 24.7*   < > 22.2*   < > 24.6* 22.8*  23.5* 25.8* 28.8* 29.5*  MCV 94.3  --  101.4*   < > 98.4 97.4  --  100.4* 105.9* 100.7*  PLT 199  --  177   < > 161 152  --  165 176 197   < > = values in this interval not displayed.   BMP &GFR Recent Labs  Lab 06/28/22 0351 06/29/22 0515 06/30/22 0921 07/01/22 0608 07/02/22 0449  NA 140 141 139 143 140  K 3.4* 4.9 3.1*  3.5 4.2  CL 111 114* 109 109 105  CO2 '22 24 25 29 30  '$ GLUCOSE 125* 133* 86 101* 85  BUN 24* '22 14 10 8  '$ CREATININE 0.93 1.02* 0.78 0.70 0.64  CALCIUM 7.9* 7.6* 7.8* 8.2* 8.2*  MG 1.9 1.6* 1.7 1.8 1.7  PHOS 1.8* 4.7* 2.0* 2.8 3.3   Estimated Creatinine Clearance: 64.5 mL/min (by C-G formula based on SCr of 0.64 mg/dL). Liver & Pancreas: Recent Labs  Lab 06/28/22 0351 06/29/22 0515 06/30/22 0921 07/01/22 0608 07/02/22 0449  AST 41  --   --   --   --   ALT 35  --   --   --   --   ALKPHOS 58  --   --   --   --   BILITOT 0.5  --   --   --   --   PROT 5.4*  --   --   --   --   ALBUMIN 2.9* 2.7* 2.4* 2.7* 2.7*   No results for input(s): "LIPASE", "AMYLASE" in the last 168 hours. No results for input(s): "AMMONIA" in the last 168 hours. Diabetic: No results for input(s): "HGBA1C" in the last 72 hours. Recent Labs  Lab 07/02/22 1150 07/02/22 1602 07/02/22 2053 07/03/22 0804 07/03/22 1140  GLUCAP 96 166* 146* 76 103*   Cardiac Enzymes: No results for input(s): "CKTOTAL", "CKMB", "CKMBINDEX", "TROPONINI" in the last 168 hours. No results for input(s): "PROBNP" in the last 8760 hours. Coagulation Profile: Recent Labs  Lab 06/28/22 0351  INR 1.1   Thyroid Function Tests: No results for input(s): "TSH", "T4TOTAL", "FREET4", "T3FREE", "THYROIDAB" in the last 72 hours. Lipid Profile: No results for input(s): "CHOL", "HDL", "LDLCALC", "TRIG", "CHOLHDL", "LDLDIRECT" in the last 72 hours. Anemia Panel: No results for input(s): "VITAMINB12", "FOLATE", "FERRITIN", "TIBC", "IRON", "RETICCTPCT" in the last 72 hours. Urine analysis:    Component Value  Date/Time   COLORURINE RED (A) 06/26/2022 0900   APPEARANCEUR TURBID (A) 06/26/2022 0900   LABSPEC  06/26/2022 0900    TEST NOT REPORTED DUE TO COLOR INTERFERENCE OF URINE PIGMENT   PHURINE  06/26/2022 0900    TEST NOT REPORTED DUE TO COLOR INTERFERENCE OF URINE PIGMENT   GLUCOSEU (A) 06/26/2022 0900    TEST NOT REPORTED DUE TO COLOR INTERFERENCE OF URINE PIGMENT   HGBUR (A) 06/26/2022 0900    TEST NOT REPORTED DUE TO COLOR INTERFERENCE OF URINE PIGMENT   BILIRUBINUR (A) 06/26/2022 0900    TEST NOT REPORTED DUE TO COLOR INTERFERENCE OF URINE PIGMENT   KETONESUR (A) 06/26/2022 0900    TEST NOT REPORTED DUE TO COLOR INTERFERENCE OF URINE PIGMENT   PROTEINUR (A) 06/26/2022 0900    TEST NOT REPORTED DUE TO COLOR INTERFERENCE OF URINE PIGMENT   NITRITE (A) 06/26/2022 0900    TEST NOT REPORTED DUE TO COLOR INTERFERENCE OF URINE PIGMENT   LEUKOCYTESUR (A) 06/26/2022 0900    TEST NOT REPORTED DUE TO COLOR INTERFERENCE OF URINE PIGMENT   Sepsis Labs: Invalid input(s): "PROCALCITONIN", "LACTICIDVEN"   SIGNED:  Mercy Riding, MD  [facility] 07/03/2022, 4:35 PM

## 2022-07-03 NOTE — TOC Transition Note (Signed)
Transition of Care [facility]) - CM/SW Discharge Note   Patient Details  Name: Katelyn Lamb MRN: [id number] Date of Birth: September 06, 1945  [facility] of [facility]) CM/SW Contact:  Dessa Phi, RN Phone Number: 07/03/2022, 9:42 AM   Clinical Narrative:  d/c home today-[employer] rep Kerry Dory aware of HHRN/PT/OT orders.Nsg to manage safe transport per spouse request. No further CM needs.     Final next level of care: [place] Barriers to Discharge: No Barriers Identified   Patient Goals and CMS Choice Patient states their goals for this hospitalization and ongoing recovery are:: to go home CMS Medicare.gov Compare Post Acute Care list provided to:: Patient Choice offered to / list presented to : Adult Children  Discharge Placement                       Discharge Plan and Services   Discharge Planning Services: CM Consult Post Acute Care Choice: Home Health                    HH Arranged: RN, PT, OT [facility] Agency: [facility] Date [place]: 07/03/22 Time [place]: [phone] Representative spoke with at [place]: [place] Determinants of Health ([place]) Interventions     Readmission Risk Interventions    06/29/2022    1:54 PM  Readmission Risk Prevention Plan  Transportation Screening Complete  Medication Review Press photographer) Complete  HRI or [place] Complete  SW Recovery Care/Counseling Consult Complete  [place] Not Applicable

## 2022-07-03 NOTE — Progress Notes (Signed)
ANTICOAGULATION CONSULT NOTE - follow up  Pharmacy Consult for Heparin Indication: Hx recurrent DVT/PE, apixaban held  Allergies  Allergen Reactions   Demerol [Meperidine] Other (See Comments)    Hallucinations   Hydrocodone-Acetaminophen Other (See Comments)   Percocet [Oxycodone-Acetaminophen] Itching   Amoxicillin-Pot Clavulanate Diarrhea    Severe pain, headache, intestinal infection   Penicillins Itching and Rash    Tolerated amoxicillin November 2022  Has patient had a PCN reaction causing immediate rash, facial/tongue/throat swelling, SOB or lightheadedness with hypotension:  NO Has patient had a PCN reaction causing severe rash involving mucus membranes or skin necrosis: No Has patient had a PCN reaction that required hospitalization: No Has patient had a PCN reaction occurring within the last 10 years: Yes If all of the above answers are "NO", then may proceed with Cephalosporin use.    Patient Measurements: Height: '5\' 5"'$  (165.1 cm) Weight: 80.1 kg (176 lb 9.4 oz) IBW/kg (Calculated) : 57 Heparin Dosing Weight: 74 kg  Vital Signs: Temp: 98.5 F (36.9 C) (10/31 0522) Temp Source: Oral (10/31 0522) BP: 117/74 (10/31 0522) Pulse Rate: 87 (10/31 0522)  Labs: Recent Labs    06/30/22 0921 06/30/22 1627 07/01/22 0608 07/02/22 0449 07/02/22 2054 07/03/22 0509  HGB 7.2*   < > 8.1* 8.6*  --  9.1*  HCT 22.8*   < > 25.8* 28.8*  --  29.5*  PLT 152  --  165 176  --  197  HEPARINUNFRC  --   --   --   --  0.58 0.78*  CREATININE 0.78  --  0.70 0.64  --   --    < > = values in this interval not displayed.     Estimated Creatinine Clearance: 64.5 mL/min (by C-G formula based on SCr of 0.64 mg/dL).   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)    Neurogenic bladder     Medications:  Scheduled:   acyclovir  400 mg Oral BID   Chlorhexidine Gluconate Cloth  6 each Topical Daily   diclofenac Sodium  2 g Topical QID   DULoxetine  60 mg Oral QHS   insulin aspart  0-9 Units Subcutaneous TID WC   midodrine  5 mg Oral BID WC   mirabegron ER  50 mg Oral QHS   pantoprazole  40 mg Oral QHS   phenazopyridine  200 mg Oral TID WC   predniSONE  5 mg Oral Q breakfast   pregabalin  50 mg Oral BID   QUEtiapine  50 mg Oral QHS   simethicone  80 mg Oral QID   sodium chloride flush  3 mL Intravenous Q12H   Infusions:   heparin 1,200 Units/hr (07/03/22 0526)   sodium chloride irrigation      Assessment: 74 yoF admitted 10/24 with gross hematuria related to suprapubic catheter.  Prior to admission Apixaban for recurrent DVT/PE was held.  Bleeding has now stopped s/p KCentra on 10/24, PRBC, Ferrlecit 10/27-28, continuous bladder irrigation.  Pharmacy has been  consulted to start Heparin IV without bolus on 10/30.  Last anticoag: Prior to admission apixaban '5mg'$  BID.  Last dose on 10/23 at 2200.   Today, 07/03/2022:  HL  0.78 supra-therapeutic on 1200 units/hr Hgb 9.1, Plts WNL No bleeding reported  Goal of Therapy:  Heparin level 0.3-0.7 units/ml Monitor platelets by anticoagulation protocol: Yes   Plan:  No bolus  Decrease heparin IV infusion to 1100 units/hr Heparin level in 8 hours Daily heparin level and CBC Monitor for s/s bleeding, hematuria Follow up long-term anticoagulation plans  Dolly Rias RPh 07/03/2022, 6:06 AM

## 2022-07-05 DIAGNOSIS — Z7952 Long term (current) use of systemic steroids: Secondary | ICD-10-CM | POA: Diagnosis not present

## 2022-07-05 DIAGNOSIS — Z7901 Long term (current) use of anticoagulants: Secondary | ICD-10-CM | POA: Diagnosis not present

## 2022-07-05 DIAGNOSIS — M47814 Spondylosis without myelopathy or radiculopathy, thoracic region: Secondary | ICD-10-CM | POA: Diagnosis not present

## 2022-07-05 DIAGNOSIS — Z86711 Personal history of pulmonary embolism: Secondary | ICD-10-CM | POA: Diagnosis not present

## 2022-07-05 DIAGNOSIS — I700 Atherosclerosis of aorta: Secondary | ICD-10-CM | POA: Diagnosis not present

## 2022-07-05 DIAGNOSIS — Z435 Encounter for attention to cystostomy: Secondary | ICD-10-CM | POA: Diagnosis not present

## 2022-07-05 DIAGNOSIS — Z87440 Personal history of urinary (tract) infections: Secondary | ICD-10-CM | POA: Diagnosis not present

## 2022-07-05 DIAGNOSIS — M170 Bilateral primary osteoarthritis of knee: Secondary | ICD-10-CM | POA: Diagnosis not present

## 2022-07-05 DIAGNOSIS — G894 Chronic pain syndrome: Secondary | ICD-10-CM | POA: Diagnosis not present

## 2022-07-05 DIAGNOSIS — M47812 Spondylosis without myelopathy or radiculopathy, cervical region: Secondary | ICD-10-CM | POA: Diagnosis not present

## 2022-07-05 DIAGNOSIS — F5101 Primary insomnia: Secondary | ICD-10-CM | POA: Diagnosis not present

## 2022-07-05 DIAGNOSIS — M4802 Spinal stenosis, cervical region: Secondary | ICD-10-CM | POA: Diagnosis not present

## 2022-07-05 DIAGNOSIS — M810 Age-related osteoporosis without current pathological fracture: Secondary | ICD-10-CM | POA: Diagnosis not present

## 2022-07-05 DIAGNOSIS — G8222 Paraplegia, incomplete: Secondary | ICD-10-CM | POA: Diagnosis not present

## 2022-07-05 DIAGNOSIS — N189 Chronic kidney disease, unspecified: Secondary | ICD-10-CM | POA: Diagnosis not present

## 2022-07-05 DIAGNOSIS — E46 Unspecified protein-calorie malnutrition: Secondary | ICD-10-CM | POA: Diagnosis not present

## 2022-07-05 DIAGNOSIS — N319 Neuromuscular dysfunction of bladder, unspecified: Secondary | ICD-10-CM | POA: Diagnosis not present

## 2022-07-05 DIAGNOSIS — G2581 Restless legs syndrome: Secondary | ICD-10-CM | POA: Diagnosis not present

## 2022-07-05 DIAGNOSIS — G40909 Epilepsy, unspecified, not intractable, without status epilepticus: Secondary | ICD-10-CM | POA: Diagnosis not present

## 2022-07-05 DIAGNOSIS — F411 Generalized anxiety disorder: Secondary | ICD-10-CM | POA: Diagnosis not present

## 2022-07-05 DIAGNOSIS — Z466 Encounter for fitting and adjustment of urinary device: Secondary | ICD-10-CM | POA: Diagnosis not present

## 2022-07-05 DIAGNOSIS — Z8701 Personal history of pneumonia (recurrent): Secondary | ICD-10-CM | POA: Diagnosis not present

## 2022-07-05 DIAGNOSIS — F331 Major depressive disorder, recurrent, moderate: Secondary | ICD-10-CM | POA: Diagnosis not present

## 2022-07-05 DIAGNOSIS — K219 Gastro-esophageal reflux disease without esophagitis: Secondary | ICD-10-CM | POA: Diagnosis not present

## 2022-07-06 DIAGNOSIS — N319 Neuromuscular dysfunction of bladder, unspecified: Secondary | ICD-10-CM | POA: Diagnosis not present

## 2022-07-06 DIAGNOSIS — Z87440 Personal history of urinary (tract) infections: Secondary | ICD-10-CM | POA: Diagnosis not present

## 2022-07-06 DIAGNOSIS — Z9359 Other cystostomy status: Secondary | ICD-10-CM | POA: Diagnosis not present

## 2022-07-09 DIAGNOSIS — F411 Generalized anxiety disorder: Secondary | ICD-10-CM | POA: Diagnosis not present

## 2022-07-09 DIAGNOSIS — E46 Unspecified protein-calorie malnutrition: Secondary | ICD-10-CM | POA: Diagnosis not present

## 2022-07-09 DIAGNOSIS — Z7901 Long term (current) use of anticoagulants: Secondary | ICD-10-CM | POA: Diagnosis not present

## 2022-07-09 DIAGNOSIS — F331 Major depressive disorder, recurrent, moderate: Secondary | ICD-10-CM | POA: Diagnosis not present

## 2022-07-09 DIAGNOSIS — Z435 Encounter for attention to cystostomy: Secondary | ICD-10-CM | POA: Diagnosis not present

## 2022-07-09 DIAGNOSIS — K219 Gastro-esophageal reflux disease without esophagitis: Secondary | ICD-10-CM | POA: Diagnosis not present

## 2022-07-09 DIAGNOSIS — N189 Chronic kidney disease, unspecified: Secondary | ICD-10-CM | POA: Diagnosis not present

## 2022-07-09 DIAGNOSIS — G40909 Epilepsy, unspecified, not intractable, without status epilepticus: Secondary | ICD-10-CM | POA: Diagnosis not present

## 2022-07-09 DIAGNOSIS — Z86711 Personal history of pulmonary embolism: Secondary | ICD-10-CM | POA: Diagnosis not present

## 2022-07-09 DIAGNOSIS — M47812 Spondylosis without myelopathy or radiculopathy, cervical region: Secondary | ICD-10-CM | POA: Diagnosis not present

## 2022-07-09 DIAGNOSIS — Z8701 Personal history of pneumonia (recurrent): Secondary | ICD-10-CM | POA: Diagnosis not present

## 2022-07-09 DIAGNOSIS — Z87440 Personal history of urinary (tract) infections: Secondary | ICD-10-CM | POA: Diagnosis not present

## 2022-07-09 DIAGNOSIS — I700 Atherosclerosis of aorta: Secondary | ICD-10-CM | POA: Diagnosis not present

## 2022-07-09 DIAGNOSIS — M810 Age-related osteoporosis without current pathological fracture: Secondary | ICD-10-CM | POA: Diagnosis not present

## 2022-07-09 DIAGNOSIS — G894 Chronic pain syndrome: Secondary | ICD-10-CM | POA: Diagnosis not present

## 2022-07-09 DIAGNOSIS — G8222 Paraplegia, incomplete: Secondary | ICD-10-CM | POA: Diagnosis not present

## 2022-07-09 DIAGNOSIS — M4802 Spinal stenosis, cervical region: Secondary | ICD-10-CM | POA: Diagnosis not present

## 2022-07-09 DIAGNOSIS — Z7952 Long term (current) use of systemic steroids: Secondary | ICD-10-CM | POA: Diagnosis not present

## 2022-07-09 DIAGNOSIS — N319 Neuromuscular dysfunction of bladder, unspecified: Secondary | ICD-10-CM | POA: Diagnosis not present

## 2022-07-09 DIAGNOSIS — G2581 Restless legs syndrome: Secondary | ICD-10-CM | POA: Diagnosis not present

## 2022-07-09 DIAGNOSIS — Z466 Encounter for fitting and adjustment of urinary device: Secondary | ICD-10-CM | POA: Diagnosis not present

## 2022-07-09 DIAGNOSIS — F5101 Primary insomnia: Secondary | ICD-10-CM | POA: Diagnosis not present

## 2022-07-09 DIAGNOSIS — M47814 Spondylosis without myelopathy or radiculopathy, thoracic region: Secondary | ICD-10-CM | POA: Diagnosis not present

## 2022-07-09 DIAGNOSIS — M170 Bilateral primary osteoarthritis of knee: Secondary | ICD-10-CM | POA: Diagnosis not present

## 2022-07-16 DIAGNOSIS — R310 Gross hematuria: Secondary | ICD-10-CM | POA: Diagnosis not present

## 2022-07-16 DIAGNOSIS — R319 Hematuria, unspecified: Secondary | ICD-10-CM | POA: Diagnosis not present

## 2022-07-18 ENCOUNTER — Telehealth: Payer: Self-pay | Admitting: Neurology

## 2022-07-18 DIAGNOSIS — G2581 Restless legs syndrome: Secondary | ICD-10-CM | POA: Diagnosis not present

## 2022-07-18 DIAGNOSIS — Z466 Encounter for fitting and adjustment of urinary device: Secondary | ICD-10-CM | POA: Diagnosis not present

## 2022-07-18 DIAGNOSIS — M810 Age-related osteoporosis without current pathological fracture: Secondary | ICD-10-CM | POA: Diagnosis not present

## 2022-07-18 DIAGNOSIS — N189 Chronic kidney disease, unspecified: Secondary | ICD-10-CM | POA: Diagnosis not present

## 2022-07-18 DIAGNOSIS — M47814 Spondylosis without myelopathy or radiculopathy, thoracic region: Secondary | ICD-10-CM | POA: Diagnosis not present

## 2022-07-18 DIAGNOSIS — I9589 Other hypotension: Secondary | ICD-10-CM | POA: Diagnosis not present

## 2022-07-18 DIAGNOSIS — M47812 Spondylosis without myelopathy or radiculopathy, cervical region: Secondary | ICD-10-CM | POA: Diagnosis not present

## 2022-07-18 DIAGNOSIS — Z7901 Long term (current) use of anticoagulants: Secondary | ICD-10-CM | POA: Diagnosis not present

## 2022-07-18 DIAGNOSIS — I700 Atherosclerosis of aorta: Secondary | ICD-10-CM | POA: Diagnosis not present

## 2022-07-18 DIAGNOSIS — G8222 Paraplegia, incomplete: Secondary | ICD-10-CM | POA: Diagnosis not present

## 2022-07-18 DIAGNOSIS — Z87440 Personal history of urinary (tract) infections: Secondary | ICD-10-CM | POA: Diagnosis not present

## 2022-07-18 DIAGNOSIS — Z7952 Long term (current) use of systemic steroids: Secondary | ICD-10-CM | POA: Diagnosis not present

## 2022-07-18 DIAGNOSIS — E46 Unspecified protein-calorie malnutrition: Secondary | ICD-10-CM | POA: Diagnosis not present

## 2022-07-18 DIAGNOSIS — M4802 Spinal stenosis, cervical region: Secondary | ICD-10-CM | POA: Diagnosis not present

## 2022-07-18 DIAGNOSIS — Z8701 Personal history of pneumonia (recurrent): Secondary | ICD-10-CM | POA: Diagnosis not present

## 2022-07-18 DIAGNOSIS — G40909 Epilepsy, unspecified, not intractable, without status epilepticus: Secondary | ICD-10-CM | POA: Diagnosis not present

## 2022-07-18 DIAGNOSIS — F5101 Primary insomnia: Secondary | ICD-10-CM | POA: Diagnosis not present

## 2022-07-18 DIAGNOSIS — I82502 Chronic embolism and thrombosis of unspecified deep veins of left lower extremity: Secondary | ICD-10-CM | POA: Diagnosis not present

## 2022-07-18 DIAGNOSIS — F331 Major depressive disorder, recurrent, moderate: Secondary | ICD-10-CM | POA: Diagnosis not present

## 2022-07-18 DIAGNOSIS — E7800 Pure hypercholesterolemia, unspecified: Secondary | ICD-10-CM | POA: Diagnosis not present

## 2022-07-18 DIAGNOSIS — G894 Chronic pain syndrome: Secondary | ICD-10-CM | POA: Diagnosis not present

## 2022-07-18 DIAGNOSIS — M170 Bilateral primary osteoarthritis of knee: Secondary | ICD-10-CM | POA: Diagnosis not present

## 2022-07-18 DIAGNOSIS — Z86711 Personal history of pulmonary embolism: Secondary | ICD-10-CM | POA: Diagnosis not present

## 2022-07-18 DIAGNOSIS — D500 Iron deficiency anemia secondary to blood loss (chronic): Secondary | ICD-10-CM | POA: Diagnosis not present

## 2022-07-18 DIAGNOSIS — N319 Neuromuscular dysfunction of bladder, unspecified: Secondary | ICD-10-CM | POA: Diagnosis not present

## 2022-07-18 DIAGNOSIS — F411 Generalized anxiety disorder: Secondary | ICD-10-CM | POA: Diagnosis not present

## 2022-07-18 DIAGNOSIS — K219 Gastro-esophageal reflux disease without esophagitis: Secondary | ICD-10-CM | POA: Diagnosis not present

## 2022-07-18 DIAGNOSIS — Z435 Encounter for attention to cystostomy: Secondary | ICD-10-CM | POA: Diagnosis not present

## 2022-07-18 NOTE — Telephone Encounter (Signed)
..   Pt understands that although there may be some limitations with this type of visit, we will take all precautions to reduce any security or privacy concerns.  Pt understands that this will be treated like an in office visit and we will file with pt's insurance, and there may be a patient responsible charge related to this service. ? ?

## 2022-07-19 ENCOUNTER — Other Ambulatory Visit: Payer: Self-pay | Admitting: Neurology

## 2022-07-19 DIAGNOSIS — G0490 Encephalitis and encephalomyelitis, unspecified: Secondary | ICD-10-CM

## 2022-07-19 DIAGNOSIS — G8222 Paraplegia, incomplete: Secondary | ICD-10-CM

## 2022-07-25 DIAGNOSIS — J9601 Acute respiratory failure with hypoxia: Secondary | ICD-10-CM | POA: Diagnosis not present

## 2022-07-25 DIAGNOSIS — J168 Pneumonia due to other specified infectious organisms: Secondary | ICD-10-CM | POA: Diagnosis not present

## 2022-07-25 DIAGNOSIS — G8222 Paraplegia, incomplete: Secondary | ICD-10-CM | POA: Diagnosis not present

## 2022-07-27 ENCOUNTER — Other Ambulatory Visit: Payer: Self-pay | Admitting: Neurology

## 2022-07-27 DIAGNOSIS — G9340 Encephalopathy, unspecified: Secondary | ICD-10-CM

## 2022-07-27 DIAGNOSIS — G0490 Encephalitis and encephalomyelitis, unspecified: Secondary | ICD-10-CM

## 2022-07-30 DIAGNOSIS — J449 Chronic obstructive pulmonary disease, unspecified: Secondary | ICD-10-CM | POA: Diagnosis not present

## 2022-07-30 DIAGNOSIS — Z515 Encounter for palliative care: Secondary | ICD-10-CM | POA: Diagnosis not present

## 2022-07-30 DIAGNOSIS — B0089 Other herpesviral infection: Secondary | ICD-10-CM | POA: Diagnosis not present

## 2022-07-30 DIAGNOSIS — Z7901 Long term (current) use of anticoagulants: Secondary | ICD-10-CM | POA: Diagnosis not present

## 2022-07-30 DIAGNOSIS — D6869 Other thrombophilia: Secondary | ICD-10-CM | POA: Diagnosis not present

## 2022-07-30 DIAGNOSIS — Z87891 Personal history of nicotine dependence: Secondary | ICD-10-CM | POA: Diagnosis not present

## 2022-07-30 DIAGNOSIS — Z86718 Personal history of other venous thrombosis and embolism: Secondary | ICD-10-CM | POA: Diagnosis not present

## 2022-07-30 DIAGNOSIS — G63 Polyneuropathy in diseases classified elsewhere: Secondary | ICD-10-CM | POA: Diagnosis not present

## 2022-07-30 NOTE — Telephone Encounter (Signed)
Patient is due for a refill on lyrica. Patient is due for an appointment. [place] Controlled Substance Registry checked and is appropriate.

## 2022-08-01 DIAGNOSIS — K5730 Diverticulosis of large intestine without perforation or abscess without bleeding: Secondary | ICD-10-CM | POA: Insufficient documentation

## 2022-08-01 DIAGNOSIS — K5904 Chronic idiopathic constipation: Secondary | ICD-10-CM | POA: Insufficient documentation

## 2022-08-02 DIAGNOSIS — N312 Flaccid neuropathic bladder, not elsewhere classified: Secondary | ICD-10-CM | POA: Diagnosis not present

## 2022-08-02 DIAGNOSIS — J439 Emphysema, unspecified: Secondary | ICD-10-CM | POA: Diagnosis not present

## 2022-08-02 DIAGNOSIS — R0989 Other specified symptoms and signs involving the circulatory and respiratory systems: Secondary | ICD-10-CM | POA: Diagnosis not present

## 2022-08-02 DIAGNOSIS — R310 Gross hematuria: Secondary | ICD-10-CM | POA: Diagnosis not present

## 2022-08-03 DIAGNOSIS — F411 Generalized anxiety disorder: Secondary | ICD-10-CM | POA: Diagnosis not present

## 2022-08-03 DIAGNOSIS — Z86711 Personal history of pulmonary embolism: Secondary | ICD-10-CM | POA: Diagnosis not present

## 2022-08-03 DIAGNOSIS — F331 Major depressive disorder, recurrent, moderate: Secondary | ICD-10-CM | POA: Diagnosis not present

## 2022-08-03 DIAGNOSIS — Z466 Encounter for fitting and adjustment of urinary device: Secondary | ICD-10-CM | POA: Diagnosis not present

## 2022-08-03 DIAGNOSIS — Z8701 Personal history of pneumonia (recurrent): Secondary | ICD-10-CM | POA: Diagnosis not present

## 2022-08-03 DIAGNOSIS — I700 Atherosclerosis of aorta: Secondary | ICD-10-CM | POA: Diagnosis not present

## 2022-08-03 DIAGNOSIS — G2581 Restless legs syndrome: Secondary | ICD-10-CM | POA: Diagnosis not present

## 2022-08-03 DIAGNOSIS — M47812 Spondylosis without myelopathy or radiculopathy, cervical region: Secondary | ICD-10-CM | POA: Diagnosis not present

## 2022-08-03 DIAGNOSIS — N3289 Other specified disorders of bladder: Secondary | ICD-10-CM | POA: Diagnosis not present

## 2022-08-03 DIAGNOSIS — Z435 Encounter for attention to cystostomy: Secondary | ICD-10-CM | POA: Diagnosis not present

## 2022-08-03 DIAGNOSIS — M170 Bilateral primary osteoarthritis of knee: Secondary | ICD-10-CM | POA: Diagnosis not present

## 2022-08-03 DIAGNOSIS — Z7901 Long term (current) use of anticoagulants: Secondary | ICD-10-CM | POA: Diagnosis not present

## 2022-08-03 DIAGNOSIS — R8271 Bacteriuria: Secondary | ICD-10-CM | POA: Diagnosis not present

## 2022-08-03 DIAGNOSIS — Z87440 Personal history of urinary (tract) infections: Secondary | ICD-10-CM | POA: Diagnosis not present

## 2022-08-03 DIAGNOSIS — F5101 Primary insomnia: Secondary | ICD-10-CM | POA: Diagnosis not present

## 2022-08-03 DIAGNOSIS — Z7952 Long term (current) use of systemic steroids: Secondary | ICD-10-CM | POA: Diagnosis not present

## 2022-08-03 DIAGNOSIS — N189 Chronic kidney disease, unspecified: Secondary | ICD-10-CM | POA: Diagnosis not present

## 2022-08-03 DIAGNOSIS — M4802 Spinal stenosis, cervical region: Secondary | ICD-10-CM | POA: Diagnosis not present

## 2022-08-03 DIAGNOSIS — E46 Unspecified protein-calorie malnutrition: Secondary | ICD-10-CM | POA: Diagnosis not present

## 2022-08-03 DIAGNOSIS — G8222 Paraplegia, incomplete: Secondary | ICD-10-CM | POA: Diagnosis not present

## 2022-08-03 DIAGNOSIS — Z9359 Other cystostomy status: Secondary | ICD-10-CM | POA: Diagnosis not present

## 2022-08-03 DIAGNOSIS — M810 Age-related osteoporosis without current pathological fracture: Secondary | ICD-10-CM | POA: Diagnosis not present

## 2022-08-03 DIAGNOSIS — N318 Other neuromuscular dysfunction of bladder: Secondary | ICD-10-CM | POA: Diagnosis not present

## 2022-08-03 DIAGNOSIS — G894 Chronic pain syndrome: Secondary | ICD-10-CM | POA: Diagnosis not present

## 2022-08-03 DIAGNOSIS — R338 Other retention of urine: Secondary | ICD-10-CM | POA: Diagnosis not present

## 2022-08-03 DIAGNOSIS — Z87898 Personal history of other specified conditions: Secondary | ICD-10-CM | POA: Diagnosis not present

## 2022-08-03 DIAGNOSIS — M47814 Spondylosis without myelopathy or radiculopathy, thoracic region: Secondary | ICD-10-CM | POA: Diagnosis not present

## 2022-08-03 DIAGNOSIS — G40909 Epilepsy, unspecified, not intractable, without status epilepticus: Secondary | ICD-10-CM | POA: Diagnosis not present

## 2022-08-03 DIAGNOSIS — N319 Neuromuscular dysfunction of bladder, unspecified: Secondary | ICD-10-CM | POA: Diagnosis not present

## 2022-08-03 DIAGNOSIS — K219 Gastro-esophageal reflux disease without esophagitis: Secondary | ICD-10-CM | POA: Diagnosis not present

## 2022-08-09 DIAGNOSIS — Z7952 Long term (current) use of systemic steroids: Secondary | ICD-10-CM | POA: Diagnosis not present

## 2022-08-09 DIAGNOSIS — N189 Chronic kidney disease, unspecified: Secondary | ICD-10-CM | POA: Diagnosis not present

## 2022-08-09 DIAGNOSIS — F5101 Primary insomnia: Secondary | ICD-10-CM | POA: Diagnosis not present

## 2022-08-09 DIAGNOSIS — E46 Unspecified protein-calorie malnutrition: Secondary | ICD-10-CM | POA: Diagnosis not present

## 2022-08-09 DIAGNOSIS — Z87440 Personal history of urinary (tract) infections: Secondary | ICD-10-CM | POA: Diagnosis not present

## 2022-08-09 DIAGNOSIS — Z7901 Long term (current) use of anticoagulants: Secondary | ICD-10-CM | POA: Diagnosis not present

## 2022-08-09 DIAGNOSIS — Z466 Encounter for fitting and adjustment of urinary device: Secondary | ICD-10-CM | POA: Diagnosis not present

## 2022-08-09 DIAGNOSIS — Z86711 Personal history of pulmonary embolism: Secondary | ICD-10-CM | POA: Diagnosis not present

## 2022-08-09 DIAGNOSIS — M4802 Spinal stenosis, cervical region: Secondary | ICD-10-CM | POA: Diagnosis not present

## 2022-08-09 DIAGNOSIS — N319 Neuromuscular dysfunction of bladder, unspecified: Secondary | ICD-10-CM | POA: Diagnosis not present

## 2022-08-09 DIAGNOSIS — G894 Chronic pain syndrome: Secondary | ICD-10-CM | POA: Diagnosis not present

## 2022-08-09 DIAGNOSIS — I700 Atherosclerosis of aorta: Secondary | ICD-10-CM | POA: Diagnosis not present

## 2022-08-09 DIAGNOSIS — G40909 Epilepsy, unspecified, not intractable, without status epilepticus: Secondary | ICD-10-CM | POA: Diagnosis not present

## 2022-08-09 DIAGNOSIS — F331 Major depressive disorder, recurrent, moderate: Secondary | ICD-10-CM | POA: Diagnosis not present

## 2022-08-09 DIAGNOSIS — M810 Age-related osteoporosis without current pathological fracture: Secondary | ICD-10-CM | POA: Diagnosis not present

## 2022-08-09 DIAGNOSIS — Z435 Encounter for attention to cystostomy: Secondary | ICD-10-CM | POA: Diagnosis not present

## 2022-08-09 DIAGNOSIS — Z8701 Personal history of pneumonia (recurrent): Secondary | ICD-10-CM | POA: Diagnosis not present

## 2022-08-09 DIAGNOSIS — M170 Bilateral primary osteoarthritis of knee: Secondary | ICD-10-CM | POA: Diagnosis not present

## 2022-08-09 DIAGNOSIS — K219 Gastro-esophageal reflux disease without esophagitis: Secondary | ICD-10-CM | POA: Diagnosis not present

## 2022-08-09 DIAGNOSIS — M47814 Spondylosis without myelopathy or radiculopathy, thoracic region: Secondary | ICD-10-CM | POA: Diagnosis not present

## 2022-08-09 DIAGNOSIS — M47812 Spondylosis without myelopathy or radiculopathy, cervical region: Secondary | ICD-10-CM | POA: Diagnosis not present

## 2022-08-09 DIAGNOSIS — G2581 Restless legs syndrome: Secondary | ICD-10-CM | POA: Diagnosis not present

## 2022-08-09 DIAGNOSIS — G8222 Paraplegia, incomplete: Secondary | ICD-10-CM | POA: Diagnosis not present

## 2022-08-09 DIAGNOSIS — F411 Generalized anxiety disorder: Secondary | ICD-10-CM | POA: Diagnosis not present

## 2022-08-12 ENCOUNTER — Other Ambulatory Visit: Payer: Self-pay | Admitting: Neurology

## 2022-08-12 DIAGNOSIS — G0490 Encephalitis and encephalomyelitis, unspecified: Secondary | ICD-10-CM

## 2022-08-12 DIAGNOSIS — G8222 Paraplegia, incomplete: Secondary | ICD-10-CM

## 2022-08-15 ENCOUNTER — Other Ambulatory Visit: Payer: Self-pay | Admitting: Neurology

## 2022-08-15 DIAGNOSIS — G0490 Encephalitis and encephalomyelitis, unspecified: Secondary | ICD-10-CM

## 2022-08-15 DIAGNOSIS — G8222 Paraplegia, incomplete: Secondary | ICD-10-CM

## 2022-08-16 DIAGNOSIS — G894 Chronic pain syndrome: Secondary | ICD-10-CM | POA: Diagnosis not present

## 2022-08-16 DIAGNOSIS — F5101 Primary insomnia: Secondary | ICD-10-CM | POA: Diagnosis not present

## 2022-08-16 DIAGNOSIS — G40909 Epilepsy, unspecified, not intractable, without status epilepticus: Secondary | ICD-10-CM | POA: Diagnosis not present

## 2022-08-16 DIAGNOSIS — Z7952 Long term (current) use of systemic steroids: Secondary | ICD-10-CM | POA: Diagnosis not present

## 2022-08-16 DIAGNOSIS — E46 Unspecified protein-calorie malnutrition: Secondary | ICD-10-CM | POA: Diagnosis not present

## 2022-08-16 DIAGNOSIS — I700 Atherosclerosis of aorta: Secondary | ICD-10-CM | POA: Diagnosis not present

## 2022-08-16 DIAGNOSIS — Z86711 Personal history of pulmonary embolism: Secondary | ICD-10-CM | POA: Diagnosis not present

## 2022-08-16 DIAGNOSIS — G8222 Paraplegia, incomplete: Secondary | ICD-10-CM | POA: Diagnosis not present

## 2022-08-16 DIAGNOSIS — M170 Bilateral primary osteoarthritis of knee: Secondary | ICD-10-CM | POA: Diagnosis not present

## 2022-08-16 DIAGNOSIS — M810 Age-related osteoporosis without current pathological fracture: Secondary | ICD-10-CM | POA: Diagnosis not present

## 2022-08-16 DIAGNOSIS — F331 Major depressive disorder, recurrent, moderate: Secondary | ICD-10-CM | POA: Diagnosis not present

## 2022-08-16 DIAGNOSIS — K219 Gastro-esophageal reflux disease without esophagitis: Secondary | ICD-10-CM | POA: Diagnosis not present

## 2022-08-16 DIAGNOSIS — Z435 Encounter for attention to cystostomy: Secondary | ICD-10-CM | POA: Diagnosis not present

## 2022-08-16 DIAGNOSIS — M47814 Spondylosis without myelopathy or radiculopathy, thoracic region: Secondary | ICD-10-CM | POA: Diagnosis not present

## 2022-08-16 DIAGNOSIS — G2581 Restless legs syndrome: Secondary | ICD-10-CM | POA: Diagnosis not present

## 2022-08-16 DIAGNOSIS — M4802 Spinal stenosis, cervical region: Secondary | ICD-10-CM | POA: Diagnosis not present

## 2022-08-16 DIAGNOSIS — Z7901 Long term (current) use of anticoagulants: Secondary | ICD-10-CM | POA: Diagnosis not present

## 2022-08-16 DIAGNOSIS — M47812 Spondylosis without myelopathy or radiculopathy, cervical region: Secondary | ICD-10-CM | POA: Diagnosis not present

## 2022-08-16 DIAGNOSIS — Z466 Encounter for fitting and adjustment of urinary device: Secondary | ICD-10-CM | POA: Diagnosis not present

## 2022-08-16 DIAGNOSIS — Z87440 Personal history of urinary (tract) infections: Secondary | ICD-10-CM | POA: Diagnosis not present

## 2022-08-16 DIAGNOSIS — Z8701 Personal history of pneumonia (recurrent): Secondary | ICD-10-CM | POA: Diagnosis not present

## 2022-08-16 DIAGNOSIS — F411 Generalized anxiety disorder: Secondary | ICD-10-CM | POA: Diagnosis not present

## 2022-08-16 DIAGNOSIS — N319 Neuromuscular dysfunction of bladder, unspecified: Secondary | ICD-10-CM | POA: Diagnosis not present

## 2022-08-16 DIAGNOSIS — N189 Chronic kidney disease, unspecified: Secondary | ICD-10-CM | POA: Diagnosis not present

## 2022-08-20 DIAGNOSIS — R310 Gross hematuria: Secondary | ICD-10-CM | POA: Diagnosis not present

## 2022-08-21 DIAGNOSIS — D62 Acute posthemorrhagic anemia: Secondary | ICD-10-CM | POA: Diagnosis not present

## 2022-08-21 DIAGNOSIS — M170 Bilateral primary osteoarthritis of knee: Secondary | ICD-10-CM | POA: Diagnosis not present

## 2022-08-21 DIAGNOSIS — M47812 Spondylosis without myelopathy or radiculopathy, cervical region: Secondary | ICD-10-CM | POA: Diagnosis not present

## 2022-08-21 DIAGNOSIS — R339 Retention of urine, unspecified: Secondary | ICD-10-CM | POA: Diagnosis not present

## 2022-08-21 DIAGNOSIS — Z993 Dependence on wheelchair: Secondary | ICD-10-CM | POA: Diagnosis not present

## 2022-08-21 DIAGNOSIS — G2581 Restless legs syndrome: Secondary | ICD-10-CM | POA: Diagnosis not present

## 2022-08-21 DIAGNOSIS — M4802 Spinal stenosis, cervical region: Secondary | ICD-10-CM | POA: Diagnosis not present

## 2022-08-21 DIAGNOSIS — Z466 Encounter for fitting and adjustment of urinary device: Secondary | ICD-10-CM | POA: Diagnosis not present

## 2022-08-21 DIAGNOSIS — G894 Chronic pain syndrome: Secondary | ICD-10-CM | POA: Diagnosis not present

## 2022-08-21 DIAGNOSIS — M810 Age-related osteoporosis without current pathological fracture: Secondary | ICD-10-CM | POA: Diagnosis not present

## 2022-08-21 DIAGNOSIS — F411 Generalized anxiety disorder: Secondary | ICD-10-CM | POA: Diagnosis not present

## 2022-08-21 DIAGNOSIS — N319 Neuromuscular dysfunction of bladder, unspecified: Secondary | ICD-10-CM | POA: Diagnosis not present

## 2022-08-21 DIAGNOSIS — G40909 Epilepsy, unspecified, not intractable, without status epilepticus: Secondary | ICD-10-CM | POA: Diagnosis not present

## 2022-08-21 DIAGNOSIS — N189 Chronic kidney disease, unspecified: Secondary | ICD-10-CM | POA: Diagnosis not present

## 2022-08-21 DIAGNOSIS — I700 Atherosclerosis of aorta: Secondary | ICD-10-CM | POA: Diagnosis not present

## 2022-08-21 DIAGNOSIS — E46 Unspecified protein-calorie malnutrition: Secondary | ICD-10-CM | POA: Diagnosis not present

## 2022-08-21 DIAGNOSIS — M47814 Spondylosis without myelopathy or radiculopathy, thoracic region: Secondary | ICD-10-CM | POA: Diagnosis not present

## 2022-08-21 DIAGNOSIS — F331 Major depressive disorder, recurrent, moderate: Secondary | ICD-10-CM | POA: Diagnosis not present

## 2022-08-21 DIAGNOSIS — K219 Gastro-esophageal reflux disease without esophagitis: Secondary | ICD-10-CM | POA: Diagnosis not present

## 2022-08-21 DIAGNOSIS — Z435 Encounter for attention to cystostomy: Secondary | ICD-10-CM | POA: Diagnosis not present

## 2022-08-21 DIAGNOSIS — F5101 Primary insomnia: Secondary | ICD-10-CM | POA: Diagnosis not present

## 2022-08-21 DIAGNOSIS — G8222 Paraplegia, incomplete: Secondary | ICD-10-CM | POA: Diagnosis not present

## 2022-08-21 DIAGNOSIS — I959 Hypotension, unspecified: Secondary | ICD-10-CM | POA: Diagnosis not present

## 2022-08-21 DIAGNOSIS — F0283 Dementia in other diseases classified elsewhere, unspecified severity, with mood disturbance: Secondary | ICD-10-CM | POA: Diagnosis not present

## 2022-08-21 DIAGNOSIS — Z9981 Dependence on supplemental oxygen: Secondary | ICD-10-CM | POA: Diagnosis not present

## 2022-08-24 ENCOUNTER — Telehealth: Payer: Self-pay | Admitting: *Deleted

## 2022-08-24 DIAGNOSIS — J9601 Acute respiratory failure with hypoxia: Secondary | ICD-10-CM | POA: Diagnosis not present

## 2022-08-24 DIAGNOSIS — J168 Pneumonia due to other specified infectious organisms: Secondary | ICD-10-CM | POA: Diagnosis not present

## 2022-08-24 DIAGNOSIS — G8222 Paraplegia, incomplete: Secondary | ICD-10-CM | POA: Diagnosis not present

## 2022-08-24 NOTE — Patient Outreach (Signed)
  Care Coordination   08/24/2022 Name: Katelyn Lamb MRN: [id number] DOB: Nov 26, 1945   Care Coordination Outreach Attempts:  An unsuccessful telephone outreach was attempted today to offer the patient information about available care coordination services as a benefit of their health plan.   Follow Up Plan:  Additional outreach attempts will be made to offer the patient care coordination information and services.   Encounter Outcome:  No Answer   Care Coordination Interventions:  No, not indicated    Raina Mina, RN Care Management Coordinator [place] Office [phone]

## 2022-08-28 DIAGNOSIS — Z5181 Encounter for therapeutic drug level monitoring: Secondary | ICD-10-CM | POA: Diagnosis not present

## 2022-08-28 DIAGNOSIS — Z79899 Other long term (current) drug therapy: Secondary | ICD-10-CM | POA: Diagnosis not present

## 2022-08-29 ENCOUNTER — Other Ambulatory Visit: Payer: Self-pay | Admitting: Neurology

## 2022-08-29 DIAGNOSIS — G0490 Encephalitis and encephalomyelitis, unspecified: Secondary | ICD-10-CM

## 2022-08-29 DIAGNOSIS — G894 Chronic pain syndrome: Secondary | ICD-10-CM | POA: Diagnosis not present

## 2022-08-29 DIAGNOSIS — G9340 Encephalopathy, unspecified: Secondary | ICD-10-CM

## 2022-08-29 DIAGNOSIS — M5136 Other intervertebral disc degeneration, lumbar region: Secondary | ICD-10-CM | POA: Diagnosis not present

## 2022-08-29 DIAGNOSIS — M542 Cervicalgia: Secondary | ICD-10-CM | POA: Diagnosis not present

## 2022-08-30 ENCOUNTER — Other Ambulatory Visit: Payer: Self-pay | Admitting: Neurology

## 2022-08-30 DIAGNOSIS — G0490 Encephalitis and encephalomyelitis, unspecified: Secondary | ICD-10-CM

## 2022-08-30 DIAGNOSIS — G9340 Encephalopathy, unspecified: Secondary | ICD-10-CM

## 2022-08-30 MED ORDER — PREGABALIN 50 MG PO CAPS: 50.0000 mg | ORAL_CAPSULE | Freq: Two times a day (BID) | ORAL | 0 refills | Status: DC

## 2022-09-04 DIAGNOSIS — K219 Gastro-esophageal reflux disease without esophagitis: Secondary | ICD-10-CM | POA: Diagnosis not present

## 2022-09-04 DIAGNOSIS — G894 Chronic pain syndrome: Secondary | ICD-10-CM | POA: Diagnosis not present

## 2022-09-04 DIAGNOSIS — G8222 Paraplegia, incomplete: Secondary | ICD-10-CM | POA: Diagnosis not present

## 2022-09-04 DIAGNOSIS — F5101 Primary insomnia: Secondary | ICD-10-CM | POA: Diagnosis not present

## 2022-09-04 DIAGNOSIS — F331 Major depressive disorder, recurrent, moderate: Secondary | ICD-10-CM | POA: Diagnosis not present

## 2022-09-04 DIAGNOSIS — F411 Generalized anxiety disorder: Secondary | ICD-10-CM | POA: Diagnosis not present

## 2022-09-04 DIAGNOSIS — M47814 Spondylosis without myelopathy or radiculopathy, thoracic region: Secondary | ICD-10-CM | POA: Diagnosis not present

## 2022-09-04 DIAGNOSIS — I700 Atherosclerosis of aorta: Secondary | ICD-10-CM | POA: Diagnosis not present

## 2022-09-04 DIAGNOSIS — N189 Chronic kidney disease, unspecified: Secondary | ICD-10-CM | POA: Diagnosis not present

## 2022-09-04 DIAGNOSIS — Z9981 Dependence on supplemental oxygen: Secondary | ICD-10-CM | POA: Diagnosis not present

## 2022-09-04 DIAGNOSIS — G40909 Epilepsy, unspecified, not intractable, without status epilepticus: Secondary | ICD-10-CM | POA: Diagnosis not present

## 2022-09-04 DIAGNOSIS — Z435 Encounter for attention to cystostomy: Secondary | ICD-10-CM | POA: Diagnosis not present

## 2022-09-04 DIAGNOSIS — M170 Bilateral primary osteoarthritis of knee: Secondary | ICD-10-CM | POA: Diagnosis not present

## 2022-09-04 DIAGNOSIS — D62 Acute posthemorrhagic anemia: Secondary | ICD-10-CM | POA: Diagnosis not present

## 2022-09-04 DIAGNOSIS — N319 Neuromuscular dysfunction of bladder, unspecified: Secondary | ICD-10-CM | POA: Diagnosis not present

## 2022-09-04 DIAGNOSIS — M810 Age-related osteoporosis without current pathological fracture: Secondary | ICD-10-CM | POA: Diagnosis not present

## 2022-09-04 DIAGNOSIS — E46 Unspecified protein-calorie malnutrition: Secondary | ICD-10-CM | POA: Diagnosis not present

## 2022-09-04 DIAGNOSIS — Z993 Dependence on wheelchair: Secondary | ICD-10-CM | POA: Diagnosis not present

## 2022-09-04 DIAGNOSIS — I959 Hypotension, unspecified: Secondary | ICD-10-CM | POA: Diagnosis not present

## 2022-09-04 DIAGNOSIS — M4802 Spinal stenosis, cervical region: Secondary | ICD-10-CM | POA: Diagnosis not present

## 2022-09-04 DIAGNOSIS — G2581 Restless legs syndrome: Secondary | ICD-10-CM | POA: Diagnosis not present

## 2022-09-04 DIAGNOSIS — F0283 Dementia in other diseases classified elsewhere, unspecified severity, with mood disturbance: Secondary | ICD-10-CM | POA: Diagnosis not present

## 2022-09-04 DIAGNOSIS — Z466 Encounter for fitting and adjustment of urinary device: Secondary | ICD-10-CM | POA: Diagnosis not present

## 2022-09-04 DIAGNOSIS — M47812 Spondylosis without myelopathy or radiculopathy, cervical region: Secondary | ICD-10-CM | POA: Diagnosis not present

## 2022-09-05 ENCOUNTER — Other Ambulatory Visit: Payer: Self-pay | Admitting: *Deleted

## 2022-09-05 NOTE — Telephone Encounter (Signed)
Refill requesting from [facility] [address] for levetiracetam 250 mg per note on 10/19/21 "Stop Keppra. Restart Lyrica 50 mg po bid (afternoon and night). "  Rx denied.

## 2022-09-11 ENCOUNTER — Telehealth (INDEPENDENT_AMBULATORY_CARE_PROVIDER_SITE_OTHER): Payer: PPO | Admitting: Neurology

## 2022-09-11 ENCOUNTER — Other Ambulatory Visit: Payer: Self-pay | Admitting: Neurology

## 2022-09-11 ENCOUNTER — Encounter: Payer: Self-pay | Admitting: Neurology

## 2022-09-11 DIAGNOSIS — G0490 Encephalitis and encephalomyelitis, unspecified: Secondary | ICD-10-CM | POA: Diagnosis not present

## 2022-09-11 DIAGNOSIS — G8222 Paraplegia, incomplete: Secondary | ICD-10-CM

## 2022-09-11 DIAGNOSIS — M792 Neuralgia and neuritis, unspecified: Secondary | ICD-10-CM | POA: Diagnosis not present

## 2022-09-11 DIAGNOSIS — G9340 Encephalopathy, unspecified: Secondary | ICD-10-CM

## 2022-09-11 DIAGNOSIS — R269 Unspecified abnormalities of gait and mobility: Secondary | ICD-10-CM

## 2022-09-11 MED ORDER — ACYCLOVIR 400 MG PO TABS: ORAL_TABLET | ORAL | 3 refills | Status: DC

## 2022-09-11 MED ORDER — DULOXETINE HCL 60 MG PO CPEP: 60.0000 mg | ORAL_CAPSULE | Freq: Every day | ORAL | 3 refills | Status: DC

## 2022-09-11 MED ORDER — DIAZEPAM 5 MG PO TABS: 5.0000 mg | ORAL_TABLET | Freq: Two times a day (BID) | ORAL | 1 refills | Status: DC | PRN

## 2022-09-11 MED ORDER — PREGABALIN 50 MG PO CAPS: 50.0000 mg | ORAL_CAPSULE | Freq: Two times a day (BID) | ORAL | 1 refills | Status: DC

## 2022-09-11 NOTE — Progress Notes (Signed)
[facility]  PATIENT: Katelyn Lamb DOB: 1946-03-19  REFERRING DOCTOR OR PCP:  Dr. Dorthy Cooler. SOURCE: Patient, husband, records from Dr. Jannifer Franklin, imaging and lab reports, multiple MRI images personally reviewed.  _________________________________  Virtual Visit via Video Note I connected with Katelyn Lamb on 09/11/22 at  2:30 PM EST by a video enabled telemedicine application and verified that I am speaking with the correct person.  I discussed the limitations of evaluation and management by telemedicine and the availability of in person appointments. The patient expressed understanding and agreed to proceed.  Patient and husband were in their home.  Provider was in the office.  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 woman with h/o encephalomyelitis and chronic gait disorder and neurogenic bladder.  She has not had any severe infections over the last year.  Her only hospital stay since the last visit was short due to hematuria.    She continues to be bedbound/chair bound.  She helps a little bit with transfers but is not able to transfer independently.  Weakness is severe in the legs and mild in the arms.  She is able to feed herself and hold a cup.  She has dysesthesias in her limbs.  Lyrica had helped.   She tried Keppra but it did not help.    Her husband does not note benefit from the K[place].   She is also on Duloxetine 60 mg po.   She did not note any difference between taking twice a day and once a day  She has a suprapubic catheter.   No recent UTI.   Bladder spasms are better with Myrbetriq.      She is taking prednisone 5 mg po qd and acyclovir.   She feels she is functioning better with these medications than when she was not on them..   She takes Seroquel 100 mg po qHS and diazepam 5 mg bid (for tremors/shakiness).  She felt much worse when  Valium was reduced in the past.        History of encephalitis     November 2017, she began developing headaches. She had a brain MRI w/o contrast done in November 2017 for this and this showed numerous supratentorial white matter abnormalities and an abnormality in the pons. Then in December, she developed back pain and used a heating pad on her back and realized that she could not feel that heat. Shortly thereafter, she developed left abdominal pain. However, she traveled to [facility] to visit her friend and during the trip she developed numbness in her legs that progressed over a week or two. By the time she returned home in early January 2018, she was using a wheel chair in the airport and having difficulty walking. She went to the ER twice and was sent home. Around that time, she had a lumbar MRI that showed a protruding disc and initially, it was felt that her symptoms were due to that. However, she lost control of her bowl/bladder and developed a left leg rash and this brought her back to the ER. This time, she was admitted at [facility] from Jan 18- 24. She had an LP that showed 490 nucleated cells, 34 RBCs, 96 glucose and 53 protein. HSV2 PCR from the CSF was positive. Serum NMO negative, anca panel was negative. C/T spine MRI demonstrated longitudinally extensive T2 abnormalities and enhancement. Brain MRI  had new lesions including frontal cortical ribboning on DWI with possible ADC correlate. She was diagnosed with HSV2 encephalomyelitis and treated with IV acyclovir and high dose steroids. She was discharged home with a pic line to continue acyclovir for 21 days and steroids were not continued. Within a couple of days following discharge, she began having numbness,and she reports involuntary movements of her arms. She was readmitted to the hospital on Jan 20 and IV acyclovir was continued and she was restarted on steroids. She did better and went to inpatient rehab for 18 days and then was discharged home with  home health PT/OT.   Due to fluctuations, she has been maintained on oral acyclovir.    IMAGING: MRI of the cervical spine 09/15/2021 shows spinal cord atrophy, T2 hyperintense signal in the posterior columns adjacent to C3-C4 and patchy signal below C6 posteriorly.  There is moderate spinal stenosis at C5-C6 and milder spinal stenosis at C4-C5 and C6-C7.  This study was done without contrast.  MRI of the brain 09/15/2021 shows confluent T2 hyperintense abnormal signal predominantly in the frontal greater than parietal lobes extending from the periventricular to the subcortical white matter.  there is also abnormal signal within the pons.  No change compared to the previous MRI from July 2022.  MRI of the cervical spine with and without contrast 02/01/2020 shows spinal cord encephalomalacia adjacent to C3 to C4 and increased signal in a patchy manner in the lower cervical spinal cord.  Moderate spinal stenosis at C5-C6.  Normal enhancement pattern.  MRI of the brain 03/14/2018 showed T2/FLAIR hyperintense foci in the hemispheres and in the pons with large confluencies in the frontal lobes.  The right frontal lobe changes have increased when compared to the 12/25/2016 MRI while the changes elsewhere are more stable.  These findings are consistent with chronic microvascular ischemic changes with superimposed more recent right frontal findings that could be due to the history of encephalomyelitis or chronic stroke.  Atrophy had increased compared to the 12/25/2016 MRI.  Normal enhancement pattern.  MRI of the cervical spine 03/14/2018 showed a focus at the cervicomedullary junction, a larger posterior focus adjacent to C3-C4 and central/posterior focus at C6.  Moderately severe spinal stenosis at C5-C6 and mild spinal stenosis at C4-C5 and C6-C7.  MRI of the cervical spine 12/25/2016 shows an enhancing focus at C3-C4 posteriorly with nonenhancing foci at the cervicomedullary junction and at C6-C7.  Moderate spinal  stenosis at C5-C6 and moderate spinal stenosis at C4-C5 and C6-C7.  MRI of the head 10/03/2016 showed patchy hyperintense foci in the hemispheres (significantly less than seen 2019 and later).  No enhancement.  Foci in the pons consistent with chronic microvascular ischemic changes.  Ribbon cortical DWI changes  MRI of the cervical and thoracic spine showed a longitudinal extensive transverse myelitis from C2 through C4C5 associated with enlargement of spinal cord and other foci adjacent to C6-C7 and T1.  In the thoracic spine, additionally, there is increased signal from T5-T7.  These enhance after contrast.  PERTINENT LABS: -- CSF Analysis:  09/2016: 490 nucleated cells, 34 RBCs, 96 glucose and 53 protein. HSV2 PCR from the CSF was positive.   -- Other Studies:  09/2016- NMO AB neg.,ANCA panel neg, ANA neg,ENA neg, ACE neg  REVIEW OF SYSTEMS: Constitutional: No fevers, chills, sweats, or change in appetite Eyes: No visual changes, double vision, eye pain Ear, nose and throat: No hearing loss, ear pain, nasal congestion, sore throat Cardiovascular: No chest pain, palpitations Respiratory:  No shortness of breath at rest or with exertion.   No wheezes GastrointestinaI: No nausea, vomiting, diarrhea, abdominal pain, fecal incontinence Genitourinary:  .  Suprapubic catheter for urinary retention Musculoskeletal:  No neck pain, back pain Integumentary: No rash, pruritus, skin lesions Neurological: as above Psychiatric: No depression at this time.  No anxiety Endocrine: No palpitations, diaphoresis, change in appetite, change in weigh or increased thirst Hematologic/Lymphatic:  No anemia, purpura, petechiae. Allergic/Immunologic: No itchy/runny eyes, nasal congestion, recent allergic reactions, rashes  ALLERGIES: Allergies  Allergen Reactions   Demerol [Meperidine] Other (See Comments)    Hallucinations   Hydrocodone-Acetaminophen Other (See Comments)   Percocet [Oxycodone-Acetaminophen]  Itching   Amoxicillin-Pot Clavulanate Diarrhea    Severe pain, headache, intestinal infection   Penicillins Itching and Rash    Tolerated amoxicillin November 2022  Has patient had a PCN reaction causing immediate rash, facial/tongue/throat swelling, SOB or lightheadedness with hypotension:  NO Has patient had a PCN reaction causing severe rash involving mucus membranes or skin necrosis: No Has patient had a PCN reaction that required hospitalization: No Has patient had a PCN reaction occurring within the last 10 years: Yes If all of the above answers are "NO", then may proceed with Cephalosporin use.    HOME MEDICATIONS:  Current Outpatient Medications:    acyclovir (ZOVIRAX) 400 MG tablet, TAKE 1 TABLET BY MOUTH 2 TIMES DAILY., Disp: 180 tablet, Rfl: 3   albuterol (PROVENTIL) (2.5 MG/3ML) 0.083% nebulizer solution, Take 3 mLs (2.5 mg total) by nebulization every 2 (two) hours as needed for wheezing or shortness of breath., Disp: 75 mL, Rfl: 12   apixaban (ELIQUIS) 5 MG TABS tablet, Take 1 tablet (5 mg total) by mouth 2 (two) times daily., Disp: 60 tablet, Rfl:    Ascorbic Acid (VITAMIN C) 500 MG CHEW, Chew 500 mg by mouth daily., Disp: , Rfl:    Calcium Carb-Cholecalciferol (CALCIUM 600 + D PO), Take 1 tablet by mouth in the morning and at bedtime., Disp: , Rfl:    Cyanocobalamin (VITAMIN B-12 PO), Take 500 mcg by mouth daily., Disp: , Rfl:    diazepam (VALIUM) 5 MG tablet, Take 1 tablet (5 mg total) by mouth every 12 (twelve) hours as needed (for tremors)., Disp: 180 tablet, Rfl: 1   DULoxetine (CYMBALTA) 60 MG capsule, Take 1 capsule (60 mg total) by mouth at bedtime., Disp: 90 capsule, Rfl: 3   feeding supplement (ENSURE ENLIVE / ENSURE PLUS) LIQD, Take 237 mLs by mouth 2 (two) times daily between meals., Disp: 237 mL, Rfl: 12   ferrous sulfate 325 (65 FE) MG tablet, Take 325 mg by mouth every morning., Disp: , Rfl:    midodrine (PROAMATINE) 5 MG tablet, Take 1 tablet (5 mg total) by  mouth 2 (two) times daily with a meal., Disp: 60 tablet, Rfl: 0   mirabegron ER (MYRBETRIQ) 50 MG TB24 tablet, Take 50 mg by mouth at bedtime., Disp: , Rfl:    omega-3 acid ethyl esters (LOVAZA) 1 g capsule, Take 1 g by mouth every morning. (Patient not taking: Reported on 06/26/2022), Disp: , Rfl:    pantoprazole (PROTONIX) 40 MG tablet, Take 1 tablet (40 mg total) by mouth daily., Disp: 30 tablet, Rfl: 1   predniSONE (DELTASONE) 5 MG tablet, TAKE 1 TABLET BY MOUTH EVERY DAY WITH BREAKFAST, Disp: 90 tablet, Rfl: 0   pregabalin (LYRICA) 50 MG capsule, Take 1 capsule (50 mg total) by mouth 2 (two) times daily., Disp: 180 capsule, Rfl: 1   QUEtiapine (  SEROQUEL) 50 MG tablet, Take 1 tablet (50 mg total) by mouth at bedtime., Disp: 90 tablet, Rfl: 1  PAST MEDICAL HISTORY: Past Medical History:  Diagnosis Date   Acute deep vein thrombosis (DVT) of popliteal vein of left lower extremity (HCC)    Anxiety    Back pain    Chronic kidney disease    Encephalomyelitis    Gait abnormality 11/14/2016   Memory difficulty 03/02/2019   Myelitis due to herpes simplex ([place])    Neurogenic bladder     PAST SURGICAL HISTORY: Past Surgical History:  Procedure Laterality Date   ABDOMINAL HYSTERECTOMY     BLADDER REPAIR     CESAREAN SECTION     IR ANGIO EXTERNAL CAROTID SEL EXT CAROTID BILAT MOD SED  07/18/2021   IR ANGIO INTRA EXTRACRAN SEL INTERNAL CAROTID BILAT MOD SED  07/18/2021   IR KYPHO LUMBAR INC FX REDUCE BONE BX UNI/BIL CANNULATION INC/IMAGING  04/11/2018   IR NEURO EACH ADD'L AFTER BASIC UNI LEFT (MS)  07/18/2021   IR NEURO EACH ADD'L AFTER BASIC UNI RIGHT (MS)  07/18/2021   IR TRANSCATH/EMBOLIZ  07/18/2021   IR US GUIDE VASC ACCESS RIGHT  07/18/2021   RADIOLOGY WITH ANESTHESIA N/A 07/18/2021   Procedure: IR WITH ANESTHESIA;  Surgeon: Pedro Earls, MD;  Location: [facility];  Service: Radiology;  Laterality: N/A;   TUBAL LIGATION      FAMILY HISTORY: Family History  Problem  Relation Age of Onset   Hypertension Mother     SOCIAL HISTORY:  Social History   Socioeconomic History   Marital status: Married    Spouse name: [name]   Number of children: 2   Years of education: 12   Highest education level: Not on file  Occupational History   Occupation: retired  Tobacco Use   Smoking status: Former    Packs/day: 2.00    Years: 20.00    Total pack years: 40.00    Types: Cigarettes   Smokeless tobacco: Never   Tobacco comments:    40 years ago   Vaping Use   Vaping Use: Never used  Substance and Sexual Activity   Alcohol use: No   Drug use: Never   Sexual activity: Not Currently  Other Topics Concern   Not on file  Social History Narrative   Lives with husband   Caffeine 2-3 cups daily   Right-handed   Social Determinants of Health   Financial Resource Strain: Not on file  Food Insecurity: No Food Insecurity (06/28/2022)   Hunger Vital Sign    Worried About Running Out of Food in the Last Year: Never true    Ran Out of Food in the Last Year: Never true  Transportation Needs: No Transportation Needs (06/28/2022)   PRAPARE - Hydrologist (Medical): No    Lack of Transportation (Non-Medical): No  Physical Activity: Not on file  Stress: Not on file  Social Connections: Not on file  Intimate Partner Violence: Not At Risk (06/28/2022)   Humiliation, Afraid, Rape, and Kick questionnaire    Fear of Current or Ex-Partner: No    Emotionally Abused: No    Physically Abused: No    Sexually Abused: No     PHYSICAL EXAM  She is a bedbound well-developed well-nourished woman in no acute distress.  The head is normocephalic and atraumatic.  Sclera are anicteric.  Visible skin appears normal.  The neck has a good range of motion.  Facial strength  seems symmetric.    DIAGNOSTIC DATA (LABS, IMAGING, TESTING) - I reviewed patient records, labs, notes, testing and imaging myself where available.  Lab Results  Component  Value Date   WBC 11.3 (H) 07/03/2022   HGB 9.1 (L) 07/03/2022   HCT 29.5 (L) 07/03/2022   MCV 100.7 (H) 07/03/2022   PLT 197 07/03/2022      Component Value Date/Time   NA 140 07/02/2022 0449   NA 137 03/02/2019 1612   K 4.2 07/02/2022 0449   CL 105 07/02/2022 0449   CO2 30 07/02/2022 0449   GLUCOSE 85 07/02/2022 0449   BUN 8 07/02/2022 0449   BUN 8 03/02/2019 1612   CREATININE 0.64 07/02/2022 0449   CREATININE 0.80 01/08/2017 1449   CALCIUM 8.2 (L) 07/02/2022 0449   PROT 5.4 (L) 06/28/2022 0351   PROT 6.8 03/02/2019 1612   ALBUMIN 2.7 (L) 07/02/2022 0449   ALBUMIN 4.2 03/02/2019 1612   AST 41 06/28/2022 0351   ALT 35 06/28/2022 0351   ALKPHOS 58 06/28/2022 0351   BILITOT 0.5 06/28/2022 0351   BILITOT 0.2 03/02/2019 1612   GFRNONAA >60 07/02/2022 0449   GFRAA >60 02/17/2020 0453   Lab Results  Component Value Date   CHOL 150 09/16/2021   HDL 48 09/16/2021   LDLCALC 76 09/16/2021   TRIG 130 09/16/2021   CHOLHDL 3.1 09/16/2021   Lab Results  Component Value Date   HGBA1C 5.2 06/27/2022   Lab Results  Component Value Date   VITAMINB12 1,261 (H) 06/29/2022   Lab Results  Component Value Date   TSH 1.817 03/15/2021       ASSESSMENT AND PLAN  Incomplete paraplegia ([place]) - Plan: acyclovir (ZOVIRAX) 400 MG tablet  Encephalopathy, unspecified - Plan: pregabalin (LYRICA) 50 MG capsule  Encephalomyelitis - Plan: DULoxetine (CYMBALTA) 60 MG capsule, acyclovir (ZOVIRAX) 400 MG tablet  Gait abnormality - Plan: DULoxetine (CYMBALTA) 60 MG capsule  Neuropathic pain - Plan: DULoxetine (CYMBALTA) 60 MG capsule   Continue Lyrica 50 mg po bid (afternoon and night).   Continue acyclovir and 5 mg daily prednisone..  Reduce Seroquel to 50 mg at night Continue diazepam for spasticity.  Suprapubic catheter managed by urology. Return in 6 months or sooner if there are new or worsening neurologic symptoms.    Follow Up Instructions: I discussed the assessment and  treatment plan with the patient. The patient was provided an opportunity to ask questions and all were answered. The patient agreed with the plan and demonstrated an understanding of the instructions.    The patient was advised to call back or seek an in-person evaluation if the symptoms worsen or if the condition fails to improve as anticipated.  27-minute office visit with the majority of the time spent f on video for history and physical, discussion/counseling and decision-making.  Additional time with extensive record review including multiple MRIs personally reviewed and documentation.   Lopez Dentinger A. Felecia Shelling, MD, [facility] 04/05/2548, 8:26 PM Certified in Neurology, Clinical Neurophysiology, Sleep Medicine and Neuroimaging  [facility] [address], [place], [place] [address] [phone]

## 2022-09-27 DIAGNOSIS — Z435 Encounter for attention to cystostomy: Secondary | ICD-10-CM | POA: Diagnosis not present

## 2022-09-27 DIAGNOSIS — M170 Bilateral primary osteoarthritis of knee: Secondary | ICD-10-CM | POA: Diagnosis not present

## 2022-09-27 DIAGNOSIS — I959 Hypotension, unspecified: Secondary | ICD-10-CM | POA: Diagnosis not present

## 2022-09-27 DIAGNOSIS — F5101 Primary insomnia: Secondary | ICD-10-CM | POA: Diagnosis not present

## 2022-09-27 DIAGNOSIS — E46 Unspecified protein-calorie malnutrition: Secondary | ICD-10-CM | POA: Diagnosis not present

## 2022-09-27 DIAGNOSIS — G8222 Paraplegia, incomplete: Secondary | ICD-10-CM | POA: Diagnosis not present

## 2022-09-27 DIAGNOSIS — M47812 Spondylosis without myelopathy or radiculopathy, cervical region: Secondary | ICD-10-CM | POA: Diagnosis not present

## 2022-09-27 DIAGNOSIS — M47814 Spondylosis without myelopathy or radiculopathy, thoracic region: Secondary | ICD-10-CM | POA: Diagnosis not present

## 2022-09-27 DIAGNOSIS — M810 Age-related osteoporosis without current pathological fracture: Secondary | ICD-10-CM | POA: Diagnosis not present

## 2022-09-27 DIAGNOSIS — D62 Acute posthemorrhagic anemia: Secondary | ICD-10-CM | POA: Diagnosis not present

## 2022-09-27 DIAGNOSIS — G2581 Restless legs syndrome: Secondary | ICD-10-CM | POA: Diagnosis not present

## 2022-09-27 DIAGNOSIS — N189 Chronic kidney disease, unspecified: Secondary | ICD-10-CM | POA: Diagnosis not present

## 2022-09-27 DIAGNOSIS — G40909 Epilepsy, unspecified, not intractable, without status epilepticus: Secondary | ICD-10-CM | POA: Diagnosis not present

## 2022-09-27 DIAGNOSIS — M4802 Spinal stenosis, cervical region: Secondary | ICD-10-CM | POA: Diagnosis not present

## 2022-09-27 DIAGNOSIS — G894 Chronic pain syndrome: Secondary | ICD-10-CM | POA: Diagnosis not present

## 2022-09-27 DIAGNOSIS — F331 Major depressive disorder, recurrent, moderate: Secondary | ICD-10-CM | POA: Diagnosis not present

## 2022-09-27 DIAGNOSIS — N319 Neuromuscular dysfunction of bladder, unspecified: Secondary | ICD-10-CM | POA: Diagnosis not present

## 2022-09-27 DIAGNOSIS — K219 Gastro-esophageal reflux disease without esophagitis: Secondary | ICD-10-CM | POA: Diagnosis not present

## 2022-09-27 DIAGNOSIS — F0283 Dementia in other diseases classified elsewhere, unspecified severity, with mood disturbance: Secondary | ICD-10-CM | POA: Diagnosis not present

## 2022-09-27 DIAGNOSIS — Z466 Encounter for fitting and adjustment of urinary device: Secondary | ICD-10-CM | POA: Diagnosis not present

## 2022-09-27 DIAGNOSIS — Z993 Dependence on wheelchair: Secondary | ICD-10-CM | POA: Diagnosis not present

## 2022-09-27 DIAGNOSIS — F411 Generalized anxiety disorder: Secondary | ICD-10-CM | POA: Diagnosis not present

## 2022-09-27 DIAGNOSIS — Z9981 Dependence on supplemental oxygen: Secondary | ICD-10-CM | POA: Diagnosis not present

## 2022-09-27 DIAGNOSIS — I700 Atherosclerosis of aorta: Secondary | ICD-10-CM | POA: Diagnosis not present

## 2022-10-02 DIAGNOSIS — R339 Retention of urine, unspecified: Secondary | ICD-10-CM | POA: Diagnosis not present

## 2022-10-08 DIAGNOSIS — J438 Other emphysema: Secondary | ICD-10-CM | POA: Diagnosis not present

## 2022-10-08 DIAGNOSIS — R040 Epistaxis: Secondary | ICD-10-CM | POA: Diagnosis not present

## 2022-10-08 DIAGNOSIS — F331 Major depressive disorder, recurrent, moderate: Secondary | ICD-10-CM | POA: Diagnosis not present

## 2022-10-08 DIAGNOSIS — Z9359 Other cystostomy status: Secondary | ICD-10-CM | POA: Diagnosis not present

## 2022-10-08 DIAGNOSIS — G8222 Paraplegia, incomplete: Secondary | ICD-10-CM | POA: Diagnosis not present

## 2022-10-08 DIAGNOSIS — Z79899 Other long term (current) drug therapy: Secondary | ICD-10-CM | POA: Diagnosis not present

## 2022-10-10 DIAGNOSIS — R5383 Other fatigue: Secondary | ICD-10-CM | POA: Diagnosis not present

## 2022-10-10 DIAGNOSIS — U071 COVID-19: Secondary | ICD-10-CM | POA: Diagnosis not present

## 2022-10-15 ENCOUNTER — Other Ambulatory Visit: Payer: Self-pay | Admitting: Family Medicine

## 2022-10-15 DIAGNOSIS — R7401 Elevation of levels of liver transaminase levels: Secondary | ICD-10-CM

## 2022-10-20 DIAGNOSIS — N189 Chronic kidney disease, unspecified: Secondary | ICD-10-CM | POA: Diagnosis not present

## 2022-10-20 DIAGNOSIS — N319 Neuromuscular dysfunction of bladder, unspecified: Secondary | ICD-10-CM | POA: Diagnosis not present

## 2022-10-20 DIAGNOSIS — M170 Bilateral primary osteoarthritis of knee: Secondary | ICD-10-CM | POA: Diagnosis not present

## 2022-10-20 DIAGNOSIS — Z466 Encounter for fitting and adjustment of urinary device: Secondary | ICD-10-CM | POA: Diagnosis not present

## 2022-10-20 DIAGNOSIS — M810 Age-related osteoporosis without current pathological fracture: Secondary | ICD-10-CM | POA: Diagnosis not present

## 2022-10-20 DIAGNOSIS — M47814 Spondylosis without myelopathy or radiculopathy, thoracic region: Secondary | ICD-10-CM | POA: Diagnosis not present

## 2022-10-20 DIAGNOSIS — M4802 Spinal stenosis, cervical region: Secondary | ICD-10-CM | POA: Diagnosis not present

## 2022-10-20 DIAGNOSIS — G8222 Paraplegia, incomplete: Secondary | ICD-10-CM | POA: Diagnosis not present

## 2022-10-20 DIAGNOSIS — I700 Atherosclerosis of aorta: Secondary | ICD-10-CM | POA: Diagnosis not present

## 2022-10-20 DIAGNOSIS — Z435 Encounter for attention to cystostomy: Secondary | ICD-10-CM | POA: Diagnosis not present

## 2022-10-20 DIAGNOSIS — M47812 Spondylosis without myelopathy or radiculopathy, cervical region: Secondary | ICD-10-CM | POA: Diagnosis not present

## 2022-10-20 DIAGNOSIS — G40909 Epilepsy, unspecified, not intractable, without status epilepticus: Secondary | ICD-10-CM | POA: Diagnosis not present

## 2022-10-22 DIAGNOSIS — G8222 Paraplegia, incomplete: Secondary | ICD-10-CM | POA: Diagnosis not present

## 2022-10-22 DIAGNOSIS — Z9981 Dependence on supplemental oxygen: Secondary | ICD-10-CM | POA: Diagnosis not present

## 2022-10-22 DIAGNOSIS — Z7952 Long term (current) use of systemic steroids: Secondary | ICD-10-CM | POA: Diagnosis not present

## 2022-10-22 DIAGNOSIS — E46 Unspecified protein-calorie malnutrition: Secondary | ICD-10-CM | POA: Diagnosis not present

## 2022-10-22 DIAGNOSIS — M810 Age-related osteoporosis without current pathological fracture: Secondary | ICD-10-CM | POA: Diagnosis not present

## 2022-10-22 DIAGNOSIS — I959 Hypotension, unspecified: Secondary | ICD-10-CM | POA: Diagnosis not present

## 2022-10-22 DIAGNOSIS — M47812 Spondylosis without myelopathy or radiculopathy, cervical region: Secondary | ICD-10-CM | POA: Diagnosis not present

## 2022-10-22 DIAGNOSIS — D62 Acute posthemorrhagic anemia: Secondary | ICD-10-CM | POA: Diagnosis not present

## 2022-10-22 DIAGNOSIS — M170 Bilateral primary osteoarthritis of knee: Secondary | ICD-10-CM | POA: Diagnosis not present

## 2022-10-22 DIAGNOSIS — G2581 Restless legs syndrome: Secondary | ICD-10-CM | POA: Diagnosis not present

## 2022-10-22 DIAGNOSIS — M4802 Spinal stenosis, cervical region: Secondary | ICD-10-CM | POA: Diagnosis not present

## 2022-10-22 DIAGNOSIS — F331 Major depressive disorder, recurrent, moderate: Secondary | ICD-10-CM | POA: Diagnosis not present

## 2022-10-22 DIAGNOSIS — I700 Atherosclerosis of aorta: Secondary | ICD-10-CM | POA: Diagnosis not present

## 2022-10-22 DIAGNOSIS — F5101 Primary insomnia: Secondary | ICD-10-CM | POA: Diagnosis not present

## 2022-10-22 DIAGNOSIS — N319 Neuromuscular dysfunction of bladder, unspecified: Secondary | ICD-10-CM | POA: Diagnosis not present

## 2022-10-22 DIAGNOSIS — K219 Gastro-esophageal reflux disease without esophagitis: Secondary | ICD-10-CM | POA: Diagnosis not present

## 2022-10-22 DIAGNOSIS — N189 Chronic kidney disease, unspecified: Secondary | ICD-10-CM | POA: Diagnosis not present

## 2022-10-22 DIAGNOSIS — Z466 Encounter for fitting and adjustment of urinary device: Secondary | ICD-10-CM | POA: Diagnosis not present

## 2022-10-22 DIAGNOSIS — F411 Generalized anxiety disorder: Secondary | ICD-10-CM | POA: Diagnosis not present

## 2022-10-22 DIAGNOSIS — Z435 Encounter for attention to cystostomy: Secondary | ICD-10-CM | POA: Diagnosis not present

## 2022-10-22 DIAGNOSIS — G40909 Epilepsy, unspecified, not intractable, without status epilepticus: Secondary | ICD-10-CM | POA: Diagnosis not present

## 2022-10-22 DIAGNOSIS — M47814 Spondylosis without myelopathy or radiculopathy, thoracic region: Secondary | ICD-10-CM | POA: Diagnosis not present

## 2022-10-22 DIAGNOSIS — G894 Chronic pain syndrome: Secondary | ICD-10-CM | POA: Diagnosis not present

## 2022-10-22 DIAGNOSIS — Z7901 Long term (current) use of anticoagulants: Secondary | ICD-10-CM | POA: Diagnosis not present

## 2022-10-25 DIAGNOSIS — R339 Retention of urine, unspecified: Secondary | ICD-10-CM | POA: Diagnosis not present

## 2022-11-03 DIAGNOSIS — Z7901 Long term (current) use of anticoagulants: Secondary | ICD-10-CM | POA: Diagnosis not present

## 2022-11-03 DIAGNOSIS — Z9981 Dependence on supplemental oxygen: Secondary | ICD-10-CM | POA: Diagnosis not present

## 2022-11-03 DIAGNOSIS — M47812 Spondylosis without myelopathy or radiculopathy, cervical region: Secondary | ICD-10-CM | POA: Diagnosis not present

## 2022-11-03 DIAGNOSIS — E46 Unspecified protein-calorie malnutrition: Secondary | ICD-10-CM | POA: Diagnosis not present

## 2022-11-03 DIAGNOSIS — M4802 Spinal stenosis, cervical region: Secondary | ICD-10-CM | POA: Diagnosis not present

## 2022-11-03 DIAGNOSIS — F411 Generalized anxiety disorder: Secondary | ICD-10-CM | POA: Diagnosis not present

## 2022-11-03 DIAGNOSIS — F331 Major depressive disorder, recurrent, moderate: Secondary | ICD-10-CM | POA: Diagnosis not present

## 2022-11-03 DIAGNOSIS — F5101 Primary insomnia: Secondary | ICD-10-CM | POA: Diagnosis not present

## 2022-11-03 DIAGNOSIS — I959 Hypotension, unspecified: Secondary | ICD-10-CM | POA: Diagnosis not present

## 2022-11-03 DIAGNOSIS — M47814 Spondylosis without myelopathy or radiculopathy, thoracic region: Secondary | ICD-10-CM | POA: Diagnosis not present

## 2022-11-03 DIAGNOSIS — Z466 Encounter for fitting and adjustment of urinary device: Secondary | ICD-10-CM | POA: Diagnosis not present

## 2022-11-03 DIAGNOSIS — M810 Age-related osteoporosis without current pathological fracture: Secondary | ICD-10-CM | POA: Diagnosis not present

## 2022-11-03 DIAGNOSIS — K219 Gastro-esophageal reflux disease without esophagitis: Secondary | ICD-10-CM | POA: Diagnosis not present

## 2022-11-03 DIAGNOSIS — Z435 Encounter for attention to cystostomy: Secondary | ICD-10-CM | POA: Diagnosis not present

## 2022-11-03 DIAGNOSIS — G8222 Paraplegia, incomplete: Secondary | ICD-10-CM | POA: Diagnosis not present

## 2022-11-03 DIAGNOSIS — G40909 Epilepsy, unspecified, not intractable, without status epilepticus: Secondary | ICD-10-CM | POA: Diagnosis not present

## 2022-11-03 DIAGNOSIS — N189 Chronic kidney disease, unspecified: Secondary | ICD-10-CM | POA: Diagnosis not present

## 2022-11-03 DIAGNOSIS — N319 Neuromuscular dysfunction of bladder, unspecified: Secondary | ICD-10-CM | POA: Diagnosis not present

## 2022-11-03 DIAGNOSIS — Z7952 Long term (current) use of systemic steroids: Secondary | ICD-10-CM | POA: Diagnosis not present

## 2022-11-03 DIAGNOSIS — G894 Chronic pain syndrome: Secondary | ICD-10-CM | POA: Diagnosis not present

## 2022-11-03 DIAGNOSIS — D62 Acute posthemorrhagic anemia: Secondary | ICD-10-CM | POA: Diagnosis not present

## 2022-11-03 DIAGNOSIS — G2581 Restless legs syndrome: Secondary | ICD-10-CM | POA: Diagnosis not present

## 2022-11-03 DIAGNOSIS — I700 Atherosclerosis of aorta: Secondary | ICD-10-CM | POA: Diagnosis not present

## 2022-11-03 DIAGNOSIS — M170 Bilateral primary osteoarthritis of knee: Secondary | ICD-10-CM | POA: Diagnosis not present

## 2022-11-05 DIAGNOSIS — Z87440 Personal history of urinary (tract) infections: Secondary | ICD-10-CM | POA: Diagnosis not present

## 2022-11-06 DIAGNOSIS — E46 Unspecified protein-calorie malnutrition: Secondary | ICD-10-CM | POA: Diagnosis not present

## 2022-11-06 DIAGNOSIS — Z7952 Long term (current) use of systemic steroids: Secondary | ICD-10-CM | POA: Diagnosis not present

## 2022-11-06 DIAGNOSIS — N319 Neuromuscular dysfunction of bladder, unspecified: Secondary | ICD-10-CM | POA: Diagnosis not present

## 2022-11-06 DIAGNOSIS — Z435 Encounter for attention to cystostomy: Secondary | ICD-10-CM | POA: Diagnosis not present

## 2022-11-06 DIAGNOSIS — N189 Chronic kidney disease, unspecified: Secondary | ICD-10-CM | POA: Diagnosis not present

## 2022-11-06 DIAGNOSIS — Z7901 Long term (current) use of anticoagulants: Secondary | ICD-10-CM | POA: Diagnosis not present

## 2022-11-06 DIAGNOSIS — Z466 Encounter for fitting and adjustment of urinary device: Secondary | ICD-10-CM | POA: Diagnosis not present

## 2022-11-06 DIAGNOSIS — Z9981 Dependence on supplemental oxygen: Secondary | ICD-10-CM | POA: Diagnosis not present

## 2022-11-06 DIAGNOSIS — G2581 Restless legs syndrome: Secondary | ICD-10-CM | POA: Diagnosis not present

## 2022-11-06 DIAGNOSIS — I959 Hypotension, unspecified: Secondary | ICD-10-CM | POA: Diagnosis not present

## 2022-11-06 DIAGNOSIS — R339 Retention of urine, unspecified: Secondary | ICD-10-CM | POA: Diagnosis not present

## 2022-11-06 DIAGNOSIS — M47812 Spondylosis without myelopathy or radiculopathy, cervical region: Secondary | ICD-10-CM | POA: Diagnosis not present

## 2022-11-06 DIAGNOSIS — G894 Chronic pain syndrome: Secondary | ICD-10-CM | POA: Diagnosis not present

## 2022-11-06 DIAGNOSIS — M810 Age-related osteoporosis without current pathological fracture: Secondary | ICD-10-CM | POA: Diagnosis not present

## 2022-11-06 DIAGNOSIS — G40909 Epilepsy, unspecified, not intractable, without status epilepticus: Secondary | ICD-10-CM | POA: Diagnosis not present

## 2022-11-06 DIAGNOSIS — G8222 Paraplegia, incomplete: Secondary | ICD-10-CM | POA: Diagnosis not present

## 2022-11-06 DIAGNOSIS — M4802 Spinal stenosis, cervical region: Secondary | ICD-10-CM | POA: Diagnosis not present

## 2022-11-06 DIAGNOSIS — F411 Generalized anxiety disorder: Secondary | ICD-10-CM | POA: Diagnosis not present

## 2022-11-06 DIAGNOSIS — F331 Major depressive disorder, recurrent, moderate: Secondary | ICD-10-CM | POA: Diagnosis not present

## 2022-11-06 DIAGNOSIS — D62 Acute posthemorrhagic anemia: Secondary | ICD-10-CM | POA: Diagnosis not present

## 2022-11-06 DIAGNOSIS — M170 Bilateral primary osteoarthritis of knee: Secondary | ICD-10-CM | POA: Diagnosis not present

## 2022-11-06 DIAGNOSIS — I700 Atherosclerosis of aorta: Secondary | ICD-10-CM | POA: Diagnosis not present

## 2022-11-06 DIAGNOSIS — F5101 Primary insomnia: Secondary | ICD-10-CM | POA: Diagnosis not present

## 2022-11-06 DIAGNOSIS — M47814 Spondylosis without myelopathy or radiculopathy, thoracic region: Secondary | ICD-10-CM | POA: Diagnosis not present

## 2022-11-06 DIAGNOSIS — K219 Gastro-esophageal reflux disease without esophagitis: Secondary | ICD-10-CM | POA: Diagnosis not present

## 2022-11-09 DIAGNOSIS — Z7952 Long term (current) use of systemic steroids: Secondary | ICD-10-CM | POA: Diagnosis not present

## 2022-11-09 DIAGNOSIS — Z466 Encounter for fitting and adjustment of urinary device: Secondary | ICD-10-CM | POA: Diagnosis not present

## 2022-11-09 DIAGNOSIS — G894 Chronic pain syndrome: Secondary | ICD-10-CM | POA: Diagnosis not present

## 2022-11-09 DIAGNOSIS — N319 Neuromuscular dysfunction of bladder, unspecified: Secondary | ICD-10-CM | POA: Diagnosis not present

## 2022-11-09 DIAGNOSIS — I959 Hypotension, unspecified: Secondary | ICD-10-CM | POA: Diagnosis not present

## 2022-11-09 DIAGNOSIS — M810 Age-related osteoporosis without current pathological fracture: Secondary | ICD-10-CM | POA: Diagnosis not present

## 2022-11-09 DIAGNOSIS — Z9981 Dependence on supplemental oxygen: Secondary | ICD-10-CM | POA: Diagnosis not present

## 2022-11-09 DIAGNOSIS — G8222 Paraplegia, incomplete: Secondary | ICD-10-CM | POA: Diagnosis not present

## 2022-11-09 DIAGNOSIS — M47814 Spondylosis without myelopathy or radiculopathy, thoracic region: Secondary | ICD-10-CM | POA: Diagnosis not present

## 2022-11-09 DIAGNOSIS — I700 Atherosclerosis of aorta: Secondary | ICD-10-CM | POA: Diagnosis not present

## 2022-11-09 DIAGNOSIS — K219 Gastro-esophageal reflux disease without esophagitis: Secondary | ICD-10-CM | POA: Diagnosis not present

## 2022-11-09 DIAGNOSIS — Z7901 Long term (current) use of anticoagulants: Secondary | ICD-10-CM | POA: Diagnosis not present

## 2022-11-09 DIAGNOSIS — F5101 Primary insomnia: Secondary | ICD-10-CM | POA: Diagnosis not present

## 2022-11-09 DIAGNOSIS — D62 Acute posthemorrhagic anemia: Secondary | ICD-10-CM | POA: Diagnosis not present

## 2022-11-09 DIAGNOSIS — G2581 Restless legs syndrome: Secondary | ICD-10-CM | POA: Diagnosis not present

## 2022-11-09 DIAGNOSIS — N189 Chronic kidney disease, unspecified: Secondary | ICD-10-CM | POA: Diagnosis not present

## 2022-11-09 DIAGNOSIS — Z435 Encounter for attention to cystostomy: Secondary | ICD-10-CM | POA: Diagnosis not present

## 2022-11-09 DIAGNOSIS — G40909 Epilepsy, unspecified, not intractable, without status epilepticus: Secondary | ICD-10-CM | POA: Diagnosis not present

## 2022-11-09 DIAGNOSIS — M4802 Spinal stenosis, cervical region: Secondary | ICD-10-CM | POA: Diagnosis not present

## 2022-11-09 DIAGNOSIS — E46 Unspecified protein-calorie malnutrition: Secondary | ICD-10-CM | POA: Diagnosis not present

## 2022-11-09 DIAGNOSIS — M170 Bilateral primary osteoarthritis of knee: Secondary | ICD-10-CM | POA: Diagnosis not present

## 2022-11-09 DIAGNOSIS — F411 Generalized anxiety disorder: Secondary | ICD-10-CM | POA: Diagnosis not present

## 2022-11-09 DIAGNOSIS — F331 Major depressive disorder, recurrent, moderate: Secondary | ICD-10-CM | POA: Diagnosis not present

## 2022-11-09 DIAGNOSIS — M47812 Spondylosis without myelopathy or radiculopathy, cervical region: Secondary | ICD-10-CM | POA: Diagnosis not present

## 2022-11-13 DIAGNOSIS — N390 Urinary tract infection, site not specified: Secondary | ICD-10-CM | POA: Diagnosis not present

## 2022-11-13 DIAGNOSIS — F331 Major depressive disorder, recurrent, moderate: Secondary | ICD-10-CM | POA: Diagnosis not present

## 2022-11-14 DIAGNOSIS — Z9981 Dependence on supplemental oxygen: Secondary | ICD-10-CM | POA: Diagnosis not present

## 2022-11-14 DIAGNOSIS — E46 Unspecified protein-calorie malnutrition: Secondary | ICD-10-CM | POA: Diagnosis not present

## 2022-11-14 DIAGNOSIS — I700 Atherosclerosis of aorta: Secondary | ICD-10-CM | POA: Diagnosis not present

## 2022-11-14 DIAGNOSIS — G2581 Restless legs syndrome: Secondary | ICD-10-CM | POA: Diagnosis not present

## 2022-11-14 DIAGNOSIS — M170 Bilateral primary osteoarthritis of knee: Secondary | ICD-10-CM | POA: Diagnosis not present

## 2022-11-14 DIAGNOSIS — M47814 Spondylosis without myelopathy or radiculopathy, thoracic region: Secondary | ICD-10-CM | POA: Diagnosis not present

## 2022-11-14 DIAGNOSIS — Z435 Encounter for attention to cystostomy: Secondary | ICD-10-CM | POA: Diagnosis not present

## 2022-11-14 DIAGNOSIS — M47812 Spondylosis without myelopathy or radiculopathy, cervical region: Secondary | ICD-10-CM | POA: Diagnosis not present

## 2022-11-14 DIAGNOSIS — N319 Neuromuscular dysfunction of bladder, unspecified: Secondary | ICD-10-CM | POA: Diagnosis not present

## 2022-11-14 DIAGNOSIS — G40909 Epilepsy, unspecified, not intractable, without status epilepticus: Secondary | ICD-10-CM | POA: Diagnosis not present

## 2022-11-14 DIAGNOSIS — Z7952 Long term (current) use of systemic steroids: Secondary | ICD-10-CM | POA: Diagnosis not present

## 2022-11-14 DIAGNOSIS — Z466 Encounter for fitting and adjustment of urinary device: Secondary | ICD-10-CM | POA: Diagnosis not present

## 2022-11-14 DIAGNOSIS — K219 Gastro-esophageal reflux disease without esophagitis: Secondary | ICD-10-CM | POA: Diagnosis not present

## 2022-11-14 DIAGNOSIS — I959 Hypotension, unspecified: Secondary | ICD-10-CM | POA: Diagnosis not present

## 2022-11-14 DIAGNOSIS — F331 Major depressive disorder, recurrent, moderate: Secondary | ICD-10-CM | POA: Diagnosis not present

## 2022-11-14 DIAGNOSIS — G894 Chronic pain syndrome: Secondary | ICD-10-CM | POA: Diagnosis not present

## 2022-11-14 DIAGNOSIS — Z7901 Long term (current) use of anticoagulants: Secondary | ICD-10-CM | POA: Diagnosis not present

## 2022-11-14 DIAGNOSIS — N189 Chronic kidney disease, unspecified: Secondary | ICD-10-CM | POA: Diagnosis not present

## 2022-11-14 DIAGNOSIS — M4802 Spinal stenosis, cervical region: Secondary | ICD-10-CM | POA: Diagnosis not present

## 2022-11-14 DIAGNOSIS — D62 Acute posthemorrhagic anemia: Secondary | ICD-10-CM | POA: Diagnosis not present

## 2022-11-14 DIAGNOSIS — M810 Age-related osteoporosis without current pathological fracture: Secondary | ICD-10-CM | POA: Diagnosis not present

## 2022-11-14 DIAGNOSIS — F411 Generalized anxiety disorder: Secondary | ICD-10-CM | POA: Diagnosis not present

## 2022-11-14 DIAGNOSIS — F5101 Primary insomnia: Secondary | ICD-10-CM | POA: Diagnosis not present

## 2022-11-14 DIAGNOSIS — G8222 Paraplegia, incomplete: Secondary | ICD-10-CM | POA: Diagnosis not present

## 2022-11-26 ENCOUNTER — Other Ambulatory Visit: Payer: Self-pay | Admitting: Neurology

## 2022-11-27 ENCOUNTER — Ambulatory Visit: Payer: PPO | Admitting: Podiatry

## 2022-11-27 NOTE — Telephone Encounter (Signed)
Video visit was on 09/11/22 No follow up visit scheduled

## 2022-11-30 DIAGNOSIS — R339 Retention of urine, unspecified: Secondary | ICD-10-CM | POA: Diagnosis not present

## 2022-12-06 DIAGNOSIS — I700 Atherosclerosis of aorta: Secondary | ICD-10-CM | POA: Diagnosis not present

## 2022-12-06 DIAGNOSIS — G894 Chronic pain syndrome: Secondary | ICD-10-CM | POA: Diagnosis not present

## 2022-12-06 DIAGNOSIS — K219 Gastro-esophageal reflux disease without esophagitis: Secondary | ICD-10-CM | POA: Diagnosis not present

## 2022-12-06 DIAGNOSIS — Z7952 Long term (current) use of systemic steroids: Secondary | ICD-10-CM | POA: Diagnosis not present

## 2022-12-06 DIAGNOSIS — G2581 Restless legs syndrome: Secondary | ICD-10-CM | POA: Diagnosis not present

## 2022-12-06 DIAGNOSIS — Z7901 Long term (current) use of anticoagulants: Secondary | ICD-10-CM | POA: Diagnosis not present

## 2022-12-06 DIAGNOSIS — D62 Acute posthemorrhagic anemia: Secondary | ICD-10-CM | POA: Diagnosis not present

## 2022-12-06 DIAGNOSIS — F411 Generalized anxiety disorder: Secondary | ICD-10-CM | POA: Diagnosis not present

## 2022-12-06 DIAGNOSIS — M170 Bilateral primary osteoarthritis of knee: Secondary | ICD-10-CM | POA: Diagnosis not present

## 2022-12-06 DIAGNOSIS — F5101 Primary insomnia: Secondary | ICD-10-CM | POA: Diagnosis not present

## 2022-12-06 DIAGNOSIS — I959 Hypotension, unspecified: Secondary | ICD-10-CM | POA: Diagnosis not present

## 2022-12-06 DIAGNOSIS — M47814 Spondylosis without myelopathy or radiculopathy, thoracic region: Secondary | ICD-10-CM | POA: Diagnosis not present

## 2022-12-06 DIAGNOSIS — Z466 Encounter for fitting and adjustment of urinary device: Secondary | ICD-10-CM | POA: Diagnosis not present

## 2022-12-06 DIAGNOSIS — N319 Neuromuscular dysfunction of bladder, unspecified: Secondary | ICD-10-CM | POA: Diagnosis not present

## 2022-12-06 DIAGNOSIS — Z435 Encounter for attention to cystostomy: Secondary | ICD-10-CM | POA: Diagnosis not present

## 2022-12-06 DIAGNOSIS — M810 Age-related osteoporosis without current pathological fracture: Secondary | ICD-10-CM | POA: Diagnosis not present

## 2022-12-06 DIAGNOSIS — Z9981 Dependence on supplemental oxygen: Secondary | ICD-10-CM | POA: Diagnosis not present

## 2022-12-06 DIAGNOSIS — G8222 Paraplegia, incomplete: Secondary | ICD-10-CM | POA: Diagnosis not present

## 2022-12-06 DIAGNOSIS — M47812 Spondylosis without myelopathy or radiculopathy, cervical region: Secondary | ICD-10-CM | POA: Diagnosis not present

## 2022-12-06 DIAGNOSIS — N189 Chronic kidney disease, unspecified: Secondary | ICD-10-CM | POA: Diagnosis not present

## 2022-12-06 DIAGNOSIS — F331 Major depressive disorder, recurrent, moderate: Secondary | ICD-10-CM | POA: Diagnosis not present

## 2022-12-06 DIAGNOSIS — G40909 Epilepsy, unspecified, not intractable, without status epilepticus: Secondary | ICD-10-CM | POA: Diagnosis not present

## 2022-12-06 DIAGNOSIS — E46 Unspecified protein-calorie malnutrition: Secondary | ICD-10-CM | POA: Diagnosis not present

## 2022-12-06 DIAGNOSIS — M4802 Spinal stenosis, cervical region: Secondary | ICD-10-CM | POA: Diagnosis not present

## 2022-12-07 DIAGNOSIS — F5101 Primary insomnia: Secondary | ICD-10-CM | POA: Diagnosis not present

## 2022-12-07 DIAGNOSIS — M4802 Spinal stenosis, cervical region: Secondary | ICD-10-CM | POA: Diagnosis not present

## 2022-12-07 DIAGNOSIS — M170 Bilateral primary osteoarthritis of knee: Secondary | ICD-10-CM | POA: Diagnosis not present

## 2022-12-07 DIAGNOSIS — G2581 Restless legs syndrome: Secondary | ICD-10-CM | POA: Diagnosis not present

## 2022-12-07 DIAGNOSIS — Z7901 Long term (current) use of anticoagulants: Secondary | ICD-10-CM | POA: Diagnosis not present

## 2022-12-07 DIAGNOSIS — I700 Atherosclerosis of aorta: Secondary | ICD-10-CM | POA: Diagnosis not present

## 2022-12-07 DIAGNOSIS — D62 Acute posthemorrhagic anemia: Secondary | ICD-10-CM | POA: Diagnosis not present

## 2022-12-07 DIAGNOSIS — Z7952 Long term (current) use of systemic steroids: Secondary | ICD-10-CM | POA: Diagnosis not present

## 2022-12-07 DIAGNOSIS — M47814 Spondylosis without myelopathy or radiculopathy, thoracic region: Secondary | ICD-10-CM | POA: Diagnosis not present

## 2022-12-07 DIAGNOSIS — G40909 Epilepsy, unspecified, not intractable, without status epilepticus: Secondary | ICD-10-CM | POA: Diagnosis not present

## 2022-12-07 DIAGNOSIS — K219 Gastro-esophageal reflux disease without esophagitis: Secondary | ICD-10-CM | POA: Diagnosis not present

## 2022-12-07 DIAGNOSIS — N189 Chronic kidney disease, unspecified: Secondary | ICD-10-CM | POA: Diagnosis not present

## 2022-12-07 DIAGNOSIS — E46 Unspecified protein-calorie malnutrition: Secondary | ICD-10-CM | POA: Diagnosis not present

## 2022-12-07 DIAGNOSIS — M47812 Spondylosis without myelopathy or radiculopathy, cervical region: Secondary | ICD-10-CM | POA: Diagnosis not present

## 2022-12-07 DIAGNOSIS — G894 Chronic pain syndrome: Secondary | ICD-10-CM | POA: Diagnosis not present

## 2022-12-07 DIAGNOSIS — Z435 Encounter for attention to cystostomy: Secondary | ICD-10-CM | POA: Diagnosis not present

## 2022-12-07 DIAGNOSIS — F331 Major depressive disorder, recurrent, moderate: Secondary | ICD-10-CM | POA: Diagnosis not present

## 2022-12-07 DIAGNOSIS — N319 Neuromuscular dysfunction of bladder, unspecified: Secondary | ICD-10-CM | POA: Diagnosis not present

## 2022-12-07 DIAGNOSIS — F411 Generalized anxiety disorder: Secondary | ICD-10-CM | POA: Diagnosis not present

## 2022-12-07 DIAGNOSIS — Z9981 Dependence on supplemental oxygen: Secondary | ICD-10-CM | POA: Diagnosis not present

## 2022-12-07 DIAGNOSIS — I959 Hypotension, unspecified: Secondary | ICD-10-CM | POA: Diagnosis not present

## 2022-12-07 DIAGNOSIS — G8222 Paraplegia, incomplete: Secondary | ICD-10-CM | POA: Diagnosis not present

## 2022-12-07 DIAGNOSIS — Z466 Encounter for fitting and adjustment of urinary device: Secondary | ICD-10-CM | POA: Diagnosis not present

## 2022-12-07 DIAGNOSIS — M810 Age-related osteoporosis without current pathological fracture: Secondary | ICD-10-CM | POA: Diagnosis not present

## 2022-12-15 DIAGNOSIS — F331 Major depressive disorder, recurrent, moderate: Secondary | ICD-10-CM | POA: Diagnosis not present

## 2022-12-15 DIAGNOSIS — I700 Atherosclerosis of aorta: Secondary | ICD-10-CM | POA: Diagnosis not present

## 2022-12-15 DIAGNOSIS — F5101 Primary insomnia: Secondary | ICD-10-CM | POA: Diagnosis not present

## 2022-12-15 DIAGNOSIS — D62 Acute posthemorrhagic anemia: Secondary | ICD-10-CM | POA: Diagnosis not present

## 2022-12-15 DIAGNOSIS — G40909 Epilepsy, unspecified, not intractable, without status epilepticus: Secondary | ICD-10-CM | POA: Diagnosis not present

## 2022-12-15 DIAGNOSIS — M4802 Spinal stenosis, cervical region: Secondary | ICD-10-CM | POA: Diagnosis not present

## 2022-12-15 DIAGNOSIS — Z7952 Long term (current) use of systemic steroids: Secondary | ICD-10-CM | POA: Diagnosis not present

## 2022-12-15 DIAGNOSIS — N189 Chronic kidney disease, unspecified: Secondary | ICD-10-CM | POA: Diagnosis not present

## 2022-12-15 DIAGNOSIS — G2581 Restless legs syndrome: Secondary | ICD-10-CM | POA: Diagnosis not present

## 2022-12-15 DIAGNOSIS — F411 Generalized anxiety disorder: Secondary | ICD-10-CM | POA: Diagnosis not present

## 2022-12-15 DIAGNOSIS — K219 Gastro-esophageal reflux disease without esophagitis: Secondary | ICD-10-CM | POA: Diagnosis not present

## 2022-12-15 DIAGNOSIS — Z9981 Dependence on supplemental oxygen: Secondary | ICD-10-CM | POA: Diagnosis not present

## 2022-12-15 DIAGNOSIS — G894 Chronic pain syndrome: Secondary | ICD-10-CM | POA: Diagnosis not present

## 2022-12-15 DIAGNOSIS — I959 Hypotension, unspecified: Secondary | ICD-10-CM | POA: Diagnosis not present

## 2022-12-15 DIAGNOSIS — Z466 Encounter for fitting and adjustment of urinary device: Secondary | ICD-10-CM | POA: Diagnosis not present

## 2022-12-15 DIAGNOSIS — Z7901 Long term (current) use of anticoagulants: Secondary | ICD-10-CM | POA: Diagnosis not present

## 2022-12-15 DIAGNOSIS — M47812 Spondylosis without myelopathy or radiculopathy, cervical region: Secondary | ICD-10-CM | POA: Diagnosis not present

## 2022-12-15 DIAGNOSIS — Z435 Encounter for attention to cystostomy: Secondary | ICD-10-CM | POA: Diagnosis not present

## 2022-12-15 DIAGNOSIS — E46 Unspecified protein-calorie malnutrition: Secondary | ICD-10-CM | POA: Diagnosis not present

## 2022-12-15 DIAGNOSIS — M47814 Spondylosis without myelopathy or radiculopathy, thoracic region: Secondary | ICD-10-CM | POA: Diagnosis not present

## 2022-12-15 DIAGNOSIS — M810 Age-related osteoporosis without current pathological fracture: Secondary | ICD-10-CM | POA: Diagnosis not present

## 2022-12-15 DIAGNOSIS — N319 Neuromuscular dysfunction of bladder, unspecified: Secondary | ICD-10-CM | POA: Diagnosis not present

## 2022-12-15 DIAGNOSIS — G8222 Paraplegia, incomplete: Secondary | ICD-10-CM | POA: Diagnosis not present

## 2022-12-15 DIAGNOSIS — M170 Bilateral primary osteoarthritis of knee: Secondary | ICD-10-CM | POA: Diagnosis not present

## 2022-12-17 DIAGNOSIS — Z466 Encounter for fitting and adjustment of urinary device: Secondary | ICD-10-CM | POA: Diagnosis not present

## 2022-12-17 DIAGNOSIS — M810 Age-related osteoporosis without current pathological fracture: Secondary | ICD-10-CM | POA: Diagnosis not present

## 2022-12-17 DIAGNOSIS — K219 Gastro-esophageal reflux disease without esophagitis: Secondary | ICD-10-CM | POA: Diagnosis not present

## 2022-12-17 DIAGNOSIS — N319 Neuromuscular dysfunction of bladder, unspecified: Secondary | ICD-10-CM | POA: Diagnosis not present

## 2022-12-17 DIAGNOSIS — G8222 Paraplegia, incomplete: Secondary | ICD-10-CM | POA: Diagnosis not present

## 2022-12-17 DIAGNOSIS — G40909 Epilepsy, unspecified, not intractable, without status epilepticus: Secondary | ICD-10-CM | POA: Diagnosis not present

## 2022-12-17 DIAGNOSIS — F411 Generalized anxiety disorder: Secondary | ICD-10-CM | POA: Diagnosis not present

## 2022-12-17 DIAGNOSIS — Z7901 Long term (current) use of anticoagulants: Secondary | ICD-10-CM | POA: Diagnosis not present

## 2022-12-17 DIAGNOSIS — I959 Hypotension, unspecified: Secondary | ICD-10-CM | POA: Diagnosis not present

## 2022-12-17 DIAGNOSIS — F5101 Primary insomnia: Secondary | ICD-10-CM | POA: Diagnosis not present

## 2022-12-17 DIAGNOSIS — E46 Unspecified protein-calorie malnutrition: Secondary | ICD-10-CM | POA: Diagnosis not present

## 2022-12-17 DIAGNOSIS — M4802 Spinal stenosis, cervical region: Secondary | ICD-10-CM | POA: Diagnosis not present

## 2022-12-17 DIAGNOSIS — Z7952 Long term (current) use of systemic steroids: Secondary | ICD-10-CM | POA: Diagnosis not present

## 2022-12-17 DIAGNOSIS — Z435 Encounter for attention to cystostomy: Secondary | ICD-10-CM | POA: Diagnosis not present

## 2022-12-17 DIAGNOSIS — G894 Chronic pain syndrome: Secondary | ICD-10-CM | POA: Diagnosis not present

## 2022-12-17 DIAGNOSIS — R339 Retention of urine, unspecified: Secondary | ICD-10-CM | POA: Diagnosis not present

## 2022-12-17 DIAGNOSIS — M170 Bilateral primary osteoarthritis of knee: Secondary | ICD-10-CM | POA: Diagnosis not present

## 2022-12-17 DIAGNOSIS — F331 Major depressive disorder, recurrent, moderate: Secondary | ICD-10-CM | POA: Diagnosis not present

## 2022-12-17 DIAGNOSIS — I700 Atherosclerosis of aorta: Secondary | ICD-10-CM | POA: Diagnosis not present

## 2022-12-17 DIAGNOSIS — M47812 Spondylosis without myelopathy or radiculopathy, cervical region: Secondary | ICD-10-CM | POA: Diagnosis not present

## 2022-12-17 DIAGNOSIS — Z9981 Dependence on supplemental oxygen: Secondary | ICD-10-CM | POA: Diagnosis not present

## 2022-12-17 DIAGNOSIS — N189 Chronic kidney disease, unspecified: Secondary | ICD-10-CM | POA: Diagnosis not present

## 2022-12-17 DIAGNOSIS — D62 Acute posthemorrhagic anemia: Secondary | ICD-10-CM | POA: Diagnosis not present

## 2022-12-17 DIAGNOSIS — G2581 Restless legs syndrome: Secondary | ICD-10-CM | POA: Diagnosis not present

## 2022-12-17 DIAGNOSIS — M47814 Spondylosis without myelopathy or radiculopathy, thoracic region: Secondary | ICD-10-CM | POA: Diagnosis not present

## 2022-12-19 DIAGNOSIS — G40909 Epilepsy, unspecified, not intractable, without status epilepticus: Secondary | ICD-10-CM | POA: Diagnosis not present

## 2022-12-19 DIAGNOSIS — G8222 Paraplegia, incomplete: Secondary | ICD-10-CM | POA: Diagnosis not present

## 2022-12-19 DIAGNOSIS — R339 Retention of urine, unspecified: Secondary | ICD-10-CM | POA: Diagnosis not present

## 2022-12-19 DIAGNOSIS — M4802 Spinal stenosis, cervical region: Secondary | ICD-10-CM | POA: Diagnosis not present

## 2022-12-19 DIAGNOSIS — T83030D Leakage of cystostomy catheter, subsequent encounter: Secondary | ICD-10-CM | POA: Diagnosis not present

## 2022-12-19 DIAGNOSIS — F411 Generalized anxiety disorder: Secondary | ICD-10-CM | POA: Diagnosis not present

## 2022-12-19 DIAGNOSIS — M810 Age-related osteoporosis without current pathological fracture: Secondary | ICD-10-CM | POA: Diagnosis not present

## 2022-12-19 DIAGNOSIS — M47813 Spondylosis without myelopathy or radiculopathy, cervicothoracic region: Secondary | ICD-10-CM | POA: Diagnosis not present

## 2022-12-19 DIAGNOSIS — I700 Atherosclerosis of aorta: Secondary | ICD-10-CM | POA: Diagnosis not present

## 2022-12-19 DIAGNOSIS — N189 Chronic kidney disease, unspecified: Secondary | ICD-10-CM | POA: Diagnosis not present

## 2022-12-19 DIAGNOSIS — N319 Neuromuscular dysfunction of bladder, unspecified: Secondary | ICD-10-CM | POA: Diagnosis not present

## 2022-12-19 DIAGNOSIS — F331 Major depressive disorder, recurrent, moderate: Secondary | ICD-10-CM | POA: Diagnosis not present

## 2022-12-19 DIAGNOSIS — M170 Bilateral primary osteoarthritis of knee: Secondary | ICD-10-CM | POA: Diagnosis not present

## 2022-12-24 DIAGNOSIS — M810 Age-related osteoporosis without current pathological fracture: Secondary | ICD-10-CM | POA: Diagnosis not present

## 2022-12-24 DIAGNOSIS — Z7901 Long term (current) use of anticoagulants: Secondary | ICD-10-CM | POA: Diagnosis not present

## 2022-12-24 DIAGNOSIS — I700 Atherosclerosis of aorta: Secondary | ICD-10-CM | POA: Diagnosis not present

## 2022-12-24 DIAGNOSIS — N319 Neuromuscular dysfunction of bladder, unspecified: Secondary | ICD-10-CM | POA: Diagnosis not present

## 2022-12-24 DIAGNOSIS — I959 Hypotension, unspecified: Secondary | ICD-10-CM | POA: Diagnosis not present

## 2022-12-24 DIAGNOSIS — G894 Chronic pain syndrome: Secondary | ICD-10-CM | POA: Diagnosis not present

## 2022-12-24 DIAGNOSIS — N189 Chronic kidney disease, unspecified: Secondary | ICD-10-CM | POA: Diagnosis not present

## 2022-12-24 DIAGNOSIS — K219 Gastro-esophageal reflux disease without esophagitis: Secondary | ICD-10-CM | POA: Diagnosis not present

## 2022-12-24 DIAGNOSIS — Z87440 Personal history of urinary (tract) infections: Secondary | ICD-10-CM | POA: Diagnosis not present

## 2022-12-24 DIAGNOSIS — M4802 Spinal stenosis, cervical region: Secondary | ICD-10-CM | POA: Diagnosis not present

## 2022-12-24 DIAGNOSIS — E46 Unspecified protein-calorie malnutrition: Secondary | ICD-10-CM | POA: Diagnosis not present

## 2022-12-24 DIAGNOSIS — Z7952 Long term (current) use of systemic steroids: Secondary | ICD-10-CM | POA: Diagnosis not present

## 2022-12-24 DIAGNOSIS — T83030D Leakage of cystostomy catheter, subsequent encounter: Secondary | ICD-10-CM | POA: Diagnosis not present

## 2022-12-24 DIAGNOSIS — M47813 Spondylosis without myelopathy or radiculopathy, cervicothoracic region: Secondary | ICD-10-CM | POA: Diagnosis not present

## 2022-12-24 DIAGNOSIS — Z7401 Bed confinement status: Secondary | ICD-10-CM | POA: Diagnosis not present

## 2022-12-24 DIAGNOSIS — Z8701 Personal history of pneumonia (recurrent): Secondary | ICD-10-CM | POA: Diagnosis not present

## 2022-12-24 DIAGNOSIS — F331 Major depressive disorder, recurrent, moderate: Secondary | ICD-10-CM | POA: Diagnosis not present

## 2022-12-24 DIAGNOSIS — G40909 Epilepsy, unspecified, not intractable, without status epilepticus: Secondary | ICD-10-CM | POA: Diagnosis not present

## 2022-12-24 DIAGNOSIS — M170 Bilateral primary osteoarthritis of knee: Secondary | ICD-10-CM | POA: Diagnosis not present

## 2022-12-24 DIAGNOSIS — G8222 Paraplegia, incomplete: Secondary | ICD-10-CM | POA: Diagnosis not present

## 2022-12-24 DIAGNOSIS — G2581 Restless legs syndrome: Secondary | ICD-10-CM | POA: Diagnosis not present

## 2022-12-24 DIAGNOSIS — Z9981 Dependence on supplemental oxygen: Secondary | ICD-10-CM | POA: Diagnosis not present

## 2022-12-24 DIAGNOSIS — F411 Generalized anxiety disorder: Secondary | ICD-10-CM | POA: Diagnosis not present

## 2022-12-24 DIAGNOSIS — F5101 Primary insomnia: Secondary | ICD-10-CM | POA: Diagnosis not present

## 2023-01-03 DIAGNOSIS — R7401 Elevation of levels of liver transaminase levels: Secondary | ICD-10-CM | POA: Diagnosis not present

## 2023-01-03 DIAGNOSIS — R053 Chronic cough: Secondary | ICD-10-CM | POA: Diagnosis not present

## 2023-01-03 DIAGNOSIS — Z9359 Other cystostomy status: Secondary | ICD-10-CM | POA: Diagnosis not present

## 2023-01-03 DIAGNOSIS — G8222 Paraplegia, incomplete: Secondary | ICD-10-CM | POA: Diagnosis not present

## 2023-01-03 DIAGNOSIS — Z993 Dependence on wheelchair: Secondary | ICD-10-CM | POA: Diagnosis not present

## 2023-01-04 DIAGNOSIS — Z8701 Personal history of pneumonia (recurrent): Secondary | ICD-10-CM | POA: Diagnosis not present

## 2023-01-04 DIAGNOSIS — G8222 Paraplegia, incomplete: Secondary | ICD-10-CM | POA: Diagnosis not present

## 2023-01-04 DIAGNOSIS — M170 Bilateral primary osteoarthritis of knee: Secondary | ICD-10-CM | POA: Diagnosis not present

## 2023-01-04 DIAGNOSIS — N189 Chronic kidney disease, unspecified: Secondary | ICD-10-CM | POA: Diagnosis not present

## 2023-01-04 DIAGNOSIS — K219 Gastro-esophageal reflux disease without esophagitis: Secondary | ICD-10-CM | POA: Diagnosis not present

## 2023-01-04 DIAGNOSIS — Z7901 Long term (current) use of anticoagulants: Secondary | ICD-10-CM | POA: Diagnosis not present

## 2023-01-04 DIAGNOSIS — G894 Chronic pain syndrome: Secondary | ICD-10-CM | POA: Diagnosis not present

## 2023-01-04 DIAGNOSIS — M810 Age-related osteoporosis without current pathological fracture: Secondary | ICD-10-CM | POA: Diagnosis not present

## 2023-01-04 DIAGNOSIS — I959 Hypotension, unspecified: Secondary | ICD-10-CM | POA: Diagnosis not present

## 2023-01-04 DIAGNOSIS — G2581 Restless legs syndrome: Secondary | ICD-10-CM | POA: Diagnosis not present

## 2023-01-04 DIAGNOSIS — F331 Major depressive disorder, recurrent, moderate: Secondary | ICD-10-CM | POA: Diagnosis not present

## 2023-01-04 DIAGNOSIS — Z9981 Dependence on supplemental oxygen: Secondary | ICD-10-CM | POA: Diagnosis not present

## 2023-01-04 DIAGNOSIS — G40909 Epilepsy, unspecified, not intractable, without status epilepticus: Secondary | ICD-10-CM | POA: Diagnosis not present

## 2023-01-04 DIAGNOSIS — I700 Atherosclerosis of aorta: Secondary | ICD-10-CM | POA: Diagnosis not present

## 2023-01-04 DIAGNOSIS — T83030D Leakage of cystostomy catheter, subsequent encounter: Secondary | ICD-10-CM | POA: Diagnosis not present

## 2023-01-04 DIAGNOSIS — M47813 Spondylosis without myelopathy or radiculopathy, cervicothoracic region: Secondary | ICD-10-CM | POA: Diagnosis not present

## 2023-01-04 DIAGNOSIS — Z87440 Personal history of urinary (tract) infections: Secondary | ICD-10-CM | POA: Diagnosis not present

## 2023-01-04 DIAGNOSIS — N319 Neuromuscular dysfunction of bladder, unspecified: Secondary | ICD-10-CM | POA: Diagnosis not present

## 2023-01-04 DIAGNOSIS — F411 Generalized anxiety disorder: Secondary | ICD-10-CM | POA: Diagnosis not present

## 2023-01-04 DIAGNOSIS — Z7952 Long term (current) use of systemic steroids: Secondary | ICD-10-CM | POA: Diagnosis not present

## 2023-01-04 DIAGNOSIS — M4802 Spinal stenosis, cervical region: Secondary | ICD-10-CM | POA: Diagnosis not present

## 2023-01-04 DIAGNOSIS — F5101 Primary insomnia: Secondary | ICD-10-CM | POA: Diagnosis not present

## 2023-01-04 DIAGNOSIS — Z7401 Bed confinement status: Secondary | ICD-10-CM | POA: Diagnosis not present

## 2023-01-04 DIAGNOSIS — E46 Unspecified protein-calorie malnutrition: Secondary | ICD-10-CM | POA: Diagnosis not present

## 2023-01-10 DIAGNOSIS — Z87440 Personal history of urinary (tract) infections: Secondary | ICD-10-CM | POA: Diagnosis not present

## 2023-01-10 DIAGNOSIS — N189 Chronic kidney disease, unspecified: Secondary | ICD-10-CM | POA: Diagnosis not present

## 2023-01-10 DIAGNOSIS — N319 Neuromuscular dysfunction of bladder, unspecified: Secondary | ICD-10-CM | POA: Diagnosis not present

## 2023-01-10 DIAGNOSIS — F331 Major depressive disorder, recurrent, moderate: Secondary | ICD-10-CM | POA: Diagnosis not present

## 2023-01-10 DIAGNOSIS — I700 Atherosclerosis of aorta: Secondary | ICD-10-CM | POA: Diagnosis not present

## 2023-01-10 DIAGNOSIS — M810 Age-related osteoporosis without current pathological fracture: Secondary | ICD-10-CM | POA: Diagnosis not present

## 2023-01-10 DIAGNOSIS — F5101 Primary insomnia: Secondary | ICD-10-CM | POA: Diagnosis not present

## 2023-01-10 DIAGNOSIS — K219 Gastro-esophageal reflux disease without esophagitis: Secondary | ICD-10-CM | POA: Diagnosis not present

## 2023-01-10 DIAGNOSIS — M47813 Spondylosis without myelopathy or radiculopathy, cervicothoracic region: Secondary | ICD-10-CM | POA: Diagnosis not present

## 2023-01-10 DIAGNOSIS — M170 Bilateral primary osteoarthritis of knee: Secondary | ICD-10-CM | POA: Diagnosis not present

## 2023-01-10 DIAGNOSIS — F411 Generalized anxiety disorder: Secondary | ICD-10-CM | POA: Diagnosis not present

## 2023-01-10 DIAGNOSIS — Z7952 Long term (current) use of systemic steroids: Secondary | ICD-10-CM | POA: Diagnosis not present

## 2023-01-10 DIAGNOSIS — E46 Unspecified protein-calorie malnutrition: Secondary | ICD-10-CM | POA: Diagnosis not present

## 2023-01-10 DIAGNOSIS — T83030D Leakage of cystostomy catheter, subsequent encounter: Secondary | ICD-10-CM | POA: Diagnosis not present

## 2023-01-10 DIAGNOSIS — G8222 Paraplegia, incomplete: Secondary | ICD-10-CM | POA: Diagnosis not present

## 2023-01-10 DIAGNOSIS — G40909 Epilepsy, unspecified, not intractable, without status epilepticus: Secondary | ICD-10-CM | POA: Diagnosis not present

## 2023-01-10 DIAGNOSIS — G894 Chronic pain syndrome: Secondary | ICD-10-CM | POA: Diagnosis not present

## 2023-01-10 DIAGNOSIS — I959 Hypotension, unspecified: Secondary | ICD-10-CM | POA: Diagnosis not present

## 2023-01-10 DIAGNOSIS — M4802 Spinal stenosis, cervical region: Secondary | ICD-10-CM | POA: Diagnosis not present

## 2023-01-10 DIAGNOSIS — Z7401 Bed confinement status: Secondary | ICD-10-CM | POA: Diagnosis not present

## 2023-01-10 DIAGNOSIS — G2581 Restless legs syndrome: Secondary | ICD-10-CM | POA: Diagnosis not present

## 2023-01-10 DIAGNOSIS — Z8701 Personal history of pneumonia (recurrent): Secondary | ICD-10-CM | POA: Diagnosis not present

## 2023-01-10 DIAGNOSIS — Z9981 Dependence on supplemental oxygen: Secondary | ICD-10-CM | POA: Diagnosis not present

## 2023-01-10 DIAGNOSIS — Z7901 Long term (current) use of anticoagulants: Secondary | ICD-10-CM | POA: Diagnosis not present

## 2023-01-15 DIAGNOSIS — M5459 Other low back pain: Secondary | ICD-10-CM | POA: Diagnosis not present

## 2023-01-15 DIAGNOSIS — R413 Other amnesia: Secondary | ICD-10-CM | POA: Diagnosis not present

## 2023-01-15 DIAGNOSIS — M5416 Radiculopathy, lumbar region: Secondary | ICD-10-CM | POA: Diagnosis not present

## 2023-01-15 DIAGNOSIS — M5136 Other intervertebral disc degeneration, lumbar region: Secondary | ICD-10-CM | POA: Diagnosis not present

## 2023-01-21 DIAGNOSIS — G8222 Paraplegia, incomplete: Secondary | ICD-10-CM | POA: Diagnosis not present

## 2023-01-21 DIAGNOSIS — M47814 Spondylosis without myelopathy or radiculopathy, thoracic region: Secondary | ICD-10-CM | POA: Diagnosis not present

## 2023-02-05 DIAGNOSIS — N39498 Other specified urinary incontinence: Secondary | ICD-10-CM | POA: Diagnosis not present

## 2023-02-05 DIAGNOSIS — T859XXD Unspecified complication of internal prosthetic device, implant and graft, subsequent encounter: Secondary | ICD-10-CM | POA: Diagnosis not present

## 2023-02-05 DIAGNOSIS — R339 Retention of urine, unspecified: Secondary | ICD-10-CM | POA: Diagnosis not present

## 2023-02-05 DIAGNOSIS — R8271 Bacteriuria: Secondary | ICD-10-CM | POA: Diagnosis not present

## 2023-02-05 DIAGNOSIS — Z87898 Personal history of other specified conditions: Secondary | ICD-10-CM | POA: Diagnosis not present

## 2023-02-05 DIAGNOSIS — N3289 Other specified disorders of bladder: Secondary | ICD-10-CM | POA: Diagnosis not present

## 2023-02-05 DIAGNOSIS — Z9359 Other cystostomy status: Secondary | ICD-10-CM | POA: Diagnosis not present

## 2023-02-05 DIAGNOSIS — N319 Neuromuscular dysfunction of bladder, unspecified: Secondary | ICD-10-CM | POA: Diagnosis not present

## 2023-02-11 ENCOUNTER — Encounter: Payer: Self-pay | Admitting: Podiatry

## 2023-02-11 ENCOUNTER — Ambulatory Visit: Payer: PPO | Admitting: Podiatry

## 2023-02-11 DIAGNOSIS — B351 Tinea unguium: Secondary | ICD-10-CM

## 2023-02-11 DIAGNOSIS — Z86010 Personal history of colonic polyps: Secondary | ICD-10-CM | POA: Insufficient documentation

## 2023-02-11 DIAGNOSIS — M79674 Pain in right toe(s): Secondary | ICD-10-CM

## 2023-02-11 DIAGNOSIS — R194 Change in bowel habit: Secondary | ICD-10-CM | POA: Insufficient documentation

## 2023-02-11 DIAGNOSIS — R197 Diarrhea, unspecified: Secondary | ICD-10-CM | POA: Insufficient documentation

## 2023-02-11 DIAGNOSIS — R1031 Right lower quadrant pain: Secondary | ICD-10-CM | POA: Insufficient documentation

## 2023-02-11 DIAGNOSIS — R1033 Periumbilical pain: Secondary | ICD-10-CM | POA: Insufficient documentation

## 2023-02-11 DIAGNOSIS — M79675 Pain in left toe(s): Secondary | ICD-10-CM

## 2023-02-11 NOTE — Progress Notes (Signed)
   Chief Complaint  Patient presents with   Toe Pain    Hallux right - medial border - its been tender, but her nurse has soaked it and put cream on it and is feeling much better now   New Patient (Initial Visit)    SUBJECTIVE Patient nonambulatory in a wheelchair presents to office today complaining of elongated, thickened nails that cause pain while ambulating in shoes.  Patient is unable to trim their own nails.  Concern for possible ingrown toenail especially to the medial border of the right great toe.  Her daughter normally trims her nails.  Patient is here for further evaluation and treatment.  Past Medical History:  Diagnosis Date   Acute deep vein thrombosis (DVT) of popliteal vein of left lower extremity (HCC)    Anxiety    Back pain    Chronic kidney disease    Encephalomyelitis    Gait abnormality 11/14/2016   Memory difficulty 03/02/2019   Myelitis due to herpes simplex [facility])    Neurogenic bladder     Allergies  Allergen Reactions   Demerol [Meperidine] Other (See Comments)    Hallucinations   Hydrocodone-Acetaminophen Other (See Comments)   Percocet [Oxycodone-Acetaminophen] Itching   Amoxicillin-Pot Clavulanate Diarrhea    Severe pain, headache, intestinal infection   Penicillins Itching and Rash    Tolerated amoxicillin November 2022  Has patient had a PCN reaction causing immediate rash, facial/tongue/throat swelling, SOB or lightheadedness with hypotension:  NO Has patient had a PCN reaction causing severe rash involving mucus membranes or skin necrosis: No Has patient had a PCN reaction that required hospitalization: No Has patient had a PCN reaction occurring within the last 10 years: Yes If all of the above answers are "NO", then may proceed with Cephalosporin use.     OBJECTIVE General Patient is awake, alert, and oriented x 3 and in no acute distress. Derm Skin is dry and supple bilateral. Negative open lesions or macerations. Remaining integument  unremarkable. Nails are tender, long, thickened and dystrophic with subungual debris, consistent with onychomycosis, 1-5 bilateral. No signs of infection noted. Vasc  DP and PT pedal pulses palpable bilaterally. Temperature gradient within normal limits.  Neuro grossly intact via light touch Musculoskeletal Exam [place] wheelchair  ASSESSMENT 1.  Pain due to onychomycosis of toenails both  PLAN OF CARE 1. Patient evaluated today.  2. Instructed to maintain good pedal hygiene and foot care.  3. Mechanical debridement of nails 1-5 bilaterally performed using a nail nipper. Filed with dremel without incident.  4. Return to clinic as needed   Felecia Shelling, DPM [facility]  Dr. Felecia Shelling, DPM    [address] [place], [place] [address]                Office [phone]  Fax [phone]

## 2023-02-12 DIAGNOSIS — Z09 Encounter for follow-up examination after completed treatment for conditions other than malignant neoplasm: Secondary | ICD-10-CM | POA: Diagnosis not present

## 2023-02-12 DIAGNOSIS — Z9359 Other cystostomy status: Secondary | ICD-10-CM | POA: Diagnosis not present

## 2023-02-14 DIAGNOSIS — R339 Retention of urine, unspecified: Secondary | ICD-10-CM | POA: Diagnosis not present

## 2023-02-15 ENCOUNTER — Other Ambulatory Visit (HOSPITAL_COMMUNITY): Payer: Self-pay

## 2023-02-15 ENCOUNTER — Ambulatory Visit (INDEPENDENT_AMBULATORY_CARE_PROVIDER_SITE_OTHER): Payer: PPO | Admitting: Pulmonary Disease

## 2023-02-15 ENCOUNTER — Encounter (HOSPITAL_BASED_OUTPATIENT_CLINIC_OR_DEPARTMENT_OTHER): Payer: Self-pay | Admitting: Pulmonary Disease

## 2023-02-15 VITALS — BP 108/68 | HR 94 | Temp 97.7°F | Ht 62.0 in | Wt 170.0 lb

## 2023-02-15 DIAGNOSIS — J45991 Cough variant asthma: Secondary | ICD-10-CM | POA: Diagnosis not present

## 2023-02-15 MED ORDER — FLUTICASONE-SALMETEROL 100-50 MCG/ACT IN AEPB: 1.0000 | INHALATION_SPRAY | Freq: Two times a day (BID) | RESPIRATORY_TRACT | 5 refills | Status: AC

## 2023-02-15 NOTE — Progress Notes (Unsigned)
Subjective:   PATIENT ID: Katelyn Lamb GENDER: female DOB: 04-Feb-1946, MRN: [id number]  Chief Complaint  Patient presents with   Consult    Consult.     Reason for Visit: New consult for chronic cough  [name] is a 77 year old female former smoker with HSV encephalomyelitis in 2018 with baseline gait dysfunction/incomplete paraplegia, hx DVT 2018 and PE 2021 on chronic AC, anxiety, insomnia, hx epistaxis requiring embolization 2022 who presents for evaluation for chronic cough.  Husband and granddaughter. Patient has a hx significant for hospitalization in 08/2022 for respiratory failure 2/2 pulmonary embolism  She was seen by her PCP for UTI on 01/03/23. Records reviewed. CXR on 07/2022 with nonspecific perihilar inflammatory changes and emphysema. Referred to pulmonary for chronic cough. On albuterol nebulizer and daily PPI  She reports in the last 6 month intermittent chronic cough that is sometimes productive. Sometimes associated from upper wheezing. No clear triggers. Has not used regular rescue nebulizer. Has not tried maintenance inhalers.   Social History: Previously smoked 2ppd. Quit in 1981 Retired Film/video editor   Past Medical History:  Diagnosis Date   Acute Lamb vein thrombosis (DVT) of popliteal vein of left lower extremity (HCC)    Anxiety    Back pain    Chronic kidney disease    Encephalomyelitis    Gait abnormality 11/14/2016   Memory difficulty 03/02/2019   Myelitis due to herpes simplex (HCC)    Neurogenic bladder      Family History  Problem Relation Age of Onset   Hypertension Mother      Social History   Occupational History   Occupation: retired  Tobacco Use   Smoking status: Former    Packs/day: 2.00    Years: 20.00    Additional pack years: 0.00    Total pack years: 40.00    Types: Cigarettes   Smokeless tobacco: Never   Tobacco comments:    40 years ago   Vaping Use   Vaping Use: Never used  Substance and Sexual Activity    Alcohol use: No   Drug use: Never   Sexual activity: Not Currently    Allergies  Allergen Reactions   Demerol [Meperidine] Other (See Comments)    Hallucinations   Hydrocodone-Acetaminophen Other (See Comments)   Percocet [Oxycodone-Acetaminophen] Itching   Amoxicillin-Pot Clavulanate Diarrhea    Severe pain, headache, intestinal infection   Penicillins Itching and Rash    Tolerated amoxicillin November 2022  Has patient had a PCN reaction causing immediate rash, facial/tongue/throat swelling, SOB or lightheadedness with hypotension:  NO Has patient had a PCN reaction causing severe rash involving mucus membranes or skin necrosis: No Has patient had a PCN reaction that required hospitalization: No Has patient had a PCN reaction occurring within the last 10 years: Yes If all of the above answers are "NO", then may proceed with Cephalosporin use.     Outpatient Medications Prior to Visit  Medication Sig Dispense Refill   acyclovir (ZOVIRAX) 400 MG tablet TAKE 1 TABLET BY MOUTH 2 TIMES DAILY. 180 tablet 3   albuterol (PROVENTIL) (2.5 MG/3ML) 0.083% nebulizer solution Take 3 mLs (2.5 mg total) by nebulization every 2 (two) hours as needed for wheezing or shortness of breath. 75 mL 12   apixaban (ELIQUIS) 5 MG TABS tablet Take 1 tablet (5 mg total) by mouth 2 (two) times daily. 60 tablet    Ascorbic Acid (VITAMIN C) 500 MG CHEW Chew 500 mg by mouth daily.  Calcium Carb-Cholecalciferol (CALCIUM 600 + D PO) Take 1 tablet by mouth in the morning and at bedtime.     Cyanocobalamin (VITAMIN B-12 PO) Take 500 mcg by mouth daily.     diazepam (VALIUM) 5 MG tablet Take 1 tablet (5 mg total) by mouth every 12 (twelve) hours as needed (for tremors). 180 tablet 1   DULoxetine (CYMBALTA) 60 MG capsule Take 1 capsule (60 mg total) by mouth at bedtime. 90 capsule 3   feeding supplement (ENSURE ENLIVE / ENSURE PLUS) LIQD Take 237 mLs by mouth 2 (two) times daily between meals. 237 mL 12   ferrous  sulfate 325 (65 FE) MG tablet Take 325 mg by mouth every morning.     midodrine (PROAMATINE) 5 MG tablet Take 1 tablet (5 mg total) by mouth 2 (two) times daily with a meal. 60 tablet 0   mirabegron ER (MYRBETRIQ) 50 MG TB24 tablet Take 50 mg by mouth at bedtime.     pantoprazole (PROTONIX) 40 MG tablet Take 1 tablet (40 mg total) by mouth daily. 30 tablet 1   predniSONE (DELTASONE) 5 MG tablet TAKE 1 TABLET BY MOUTH EVERY DAY WITH BREAKFAST 90 tablet 0   pregabalin (LYRICA) 50 MG capsule Take 1 capsule (50 mg total) by mouth 2 (two) times daily. 180 capsule 1   QUEtiapine (SEROQUEL) 50 MG tablet Take 1 tablet (50 mg total) by mouth at bedtime. 90 tablet 1   omega-3 acid ethyl esters (LOVAZA) 1 g capsule Take 1 g by mouth every morning. (Patient not taking: Reported on 06/26/2022)     No facility-administered medications prior to visit.    Review of Systems  Constitutional:  Negative for chills, diaphoresis, fever, malaise/fatigue and weight loss.  HENT:  Negative for congestion.   Respiratory:  Positive for cough, sputum production, shortness of breath and wheezing. Negative for hemoptysis.   Cardiovascular:  Negative for chest pain, palpitations and leg swelling.     Objective:   Vitals:   02/15/23 1411  BP: 108/68  Pulse: 94  Temp: 97.7 F (36.5 C)  TempSrc: Oral  SpO2: 95%  Weight: 170 lb (77.1 kg)  Height: 5\' 2"  (1.575 m)   SpO2: 95 % O2 Device: None (Room air)  Physical Exam: General: Well-appearing, no acute distress HENT: [place], AT Eyes: EOMI, no scleral icterus Respiratory: Clear to auscultation bilaterally.  No crackles, wheezing or rales Cardiovascular: RRR, -M/R/G, no JVD Extremities:-Edema,-tenderness Neuro: AAO x4, CNII-XII grossly intact Psych: Normal mood, normal affect  Data Reviewed:  Imaging: CXR 08/02/22 (report only) : Impression - 1. Nonspecific perihilar inflammatory changes. 2. Emphysema  PFT: None on file  Labs:  OSH 01/03/23 Absolute eos -  400    Assessment & Plan:   Discussion: 77 year old female former smoker with HSV encephalomyelitis in 2018 with baseline gait dysfunction/incomplete paraplegia, hx DVT 2018 and PE 2021 on chronic AC, anxiety, insomnia, hx epistaxis requiring embolization 2022 who presents for evaluation for chronic cough. Discussed causes of cough including airway disease (e.g. COPD, asthma), UACS and reflux. Would be a poor candidate for PFTs due to wheelchair bound status and core weakness to tolerate testing. Patient has not tried maintenance inhaler, reasonable to trial for benefit. Wheezing could be related to lungs but also upper airways may need to be evaluated if inhalers are ineffective.   Cough variant asthma - Chronic cough/wheezing --START Advair 100-50 mcg ONE puff in the morning and evening. Rinse mouth out after use --CONTINUE Albuterol nebulizer AS NEEDED  for shortness of breath or wheezing  Hx recurrent PE/DVT --Continue eliquis 5 mg twice a day  Hx HSV encephalomyelitis with chronic gait dysfunction Chronic steroid use --Prednisone 5 mg daily. On daily PPI --Followed by Neurology  Health Maintenance Immunization History  Administered Date(s) Administered   PFIZER(Purple Top)SARS-COV-2 Vaccination 09/23/2019, 10/14/2019   Pneumococcal Conjugate-13 02/11/2015   Pneumococcal Polysaccharide-23 06/12/2016   CT Lung Screen - not qualified. Quit >15 years  No orders of the defined types were placed in this encounter.  Meds ordered this encounter  Medications   fluticasone-salmeterol (ADVAIR) 100-50 MCG/ACT AEPB    Sig: Inhale 1 puff into the lungs 2 (two) times daily.    Dispense:  60 each    Refill:  5    Dispense generic Advair per insurance    Return in about 2 months (around 04/17/2023).  I have spent a total time of 45-minutes on the day of the appointment reviewing prior documentation, coordinating care and discussing medical diagnosis and plan with the patient/family.  Imaging, labs and tests included in this note have been reviewed and interpreted independently by me.  Katelyn Lamb Mechele Collin, MD [facility] 02/15/2023 2:30 PM  Office Number [phone]

## 2023-02-17 DIAGNOSIS — I700 Atherosclerosis of aorta: Secondary | ICD-10-CM | POA: Diagnosis not present

## 2023-02-17 DIAGNOSIS — G8222 Paraplegia, incomplete: Secondary | ICD-10-CM | POA: Diagnosis not present

## 2023-02-17 DIAGNOSIS — F331 Major depressive disorder, recurrent, moderate: Secondary | ICD-10-CM | POA: Diagnosis not present

## 2023-02-17 DIAGNOSIS — F411 Generalized anxiety disorder: Secondary | ICD-10-CM | POA: Diagnosis not present

## 2023-02-17 DIAGNOSIS — T83030D Leakage of cystostomy catheter, subsequent encounter: Secondary | ICD-10-CM | POA: Diagnosis not present

## 2023-02-17 DIAGNOSIS — M47813 Spondylosis without myelopathy or radiculopathy, cervicothoracic region: Secondary | ICD-10-CM | POA: Diagnosis not present

## 2023-02-17 DIAGNOSIS — M810 Age-related osteoporosis without current pathological fracture: Secondary | ICD-10-CM | POA: Diagnosis not present

## 2023-02-17 DIAGNOSIS — N189 Chronic kidney disease, unspecified: Secondary | ICD-10-CM | POA: Diagnosis not present

## 2023-02-17 DIAGNOSIS — G40909 Epilepsy, unspecified, not intractable, without status epilepticus: Secondary | ICD-10-CM | POA: Diagnosis not present

## 2023-02-17 DIAGNOSIS — N319 Neuromuscular dysfunction of bladder, unspecified: Secondary | ICD-10-CM | POA: Diagnosis not present

## 2023-02-17 DIAGNOSIS — M4802 Spinal stenosis, cervical region: Secondary | ICD-10-CM | POA: Diagnosis not present

## 2023-02-17 DIAGNOSIS — M170 Bilateral primary osteoarthritis of knee: Secondary | ICD-10-CM | POA: Diagnosis not present

## 2023-02-20 DIAGNOSIS — G8222 Paraplegia, incomplete: Secondary | ICD-10-CM | POA: Diagnosis not present

## 2023-02-21 ENCOUNTER — Encounter (HOSPITAL_BASED_OUTPATIENT_CLINIC_OR_DEPARTMENT_OTHER): Payer: Self-pay | Admitting: Pulmonary Disease

## 2023-02-21 DIAGNOSIS — M47814 Spondylosis without myelopathy or radiculopathy, thoracic region: Secondary | ICD-10-CM | POA: Diagnosis not present

## 2023-02-21 DIAGNOSIS — G8222 Paraplegia, incomplete: Secondary | ICD-10-CM | POA: Diagnosis not present

## 2023-02-23 ENCOUNTER — Other Ambulatory Visit: Payer: Self-pay | Admitting: Neurology

## 2023-03-23 DIAGNOSIS — M47814 Spondylosis without myelopathy or radiculopathy, thoracic region: Secondary | ICD-10-CM | POA: Diagnosis not present

## 2023-03-23 DIAGNOSIS — G8222 Paraplegia, incomplete: Secondary | ICD-10-CM | POA: Diagnosis not present

## 2023-03-25 DIAGNOSIS — N390 Urinary tract infection, site not specified: Secondary | ICD-10-CM | POA: Diagnosis not present

## 2023-03-25 DIAGNOSIS — F331 Major depressive disorder, recurrent, moderate: Secondary | ICD-10-CM | POA: Diagnosis not present

## 2023-03-25 DIAGNOSIS — I700 Atherosclerosis of aorta: Secondary | ICD-10-CM | POA: Diagnosis not present

## 2023-03-25 DIAGNOSIS — N189 Chronic kidney disease, unspecified: Secondary | ICD-10-CM | POA: Diagnosis not present

## 2023-03-25 DIAGNOSIS — M47813 Spondylosis without myelopathy or radiculopathy, cervicothoracic region: Secondary | ICD-10-CM | POA: Diagnosis not present

## 2023-03-25 DIAGNOSIS — N319 Neuromuscular dysfunction of bladder, unspecified: Secondary | ICD-10-CM | POA: Diagnosis not present

## 2023-03-25 DIAGNOSIS — F411 Generalized anxiety disorder: Secondary | ICD-10-CM | POA: Diagnosis not present

## 2023-03-25 DIAGNOSIS — G8222 Paraplegia, incomplete: Secondary | ICD-10-CM | POA: Diagnosis not present

## 2023-03-25 DIAGNOSIS — M4802 Spinal stenosis, cervical region: Secondary | ICD-10-CM | POA: Diagnosis not present

## 2023-03-25 DIAGNOSIS — M810 Age-related osteoporosis without current pathological fracture: Secondary | ICD-10-CM | POA: Diagnosis not present

## 2023-03-25 DIAGNOSIS — G40909 Epilepsy, unspecified, not intractable, without status epilepticus: Secondary | ICD-10-CM | POA: Diagnosis not present

## 2023-03-25 DIAGNOSIS — T83030D Leakage of cystostomy catheter, subsequent encounter: Secondary | ICD-10-CM | POA: Diagnosis not present

## 2023-03-28 ENCOUNTER — Other Ambulatory Visit: Payer: Self-pay | Admitting: Neurology

## 2023-03-28 DIAGNOSIS — G9340 Encephalopathy, unspecified: Secondary | ICD-10-CM

## 2023-03-28 NOTE — Telephone Encounter (Signed)
Requested Prescriptions   Pending Prescriptions Disp Refills   pregabalin (LYRICA) 50 MG capsule [Pharmacy Med Name: PREGABALIN 50 MG CAPSULE] 180 capsule     Sig: TAKE 1 CAPSULE BY MOUTH 2 TIMES DAILY.   Last seen 09/11/22, next appt not scheduled Dispenses   Dispensed Days Supply Quantity Provider Pharmacy  PREGABALIN 50 MG CAPSULE 12/31/2022 90 180 each Sater, Pearletha Furl, MD [facility] [phone] - [facility]...  PREGABALIN 50 MG CAPSULE 10/02/2022 90 180 each Sater, Pearletha Furl, MD [facility] [phone] - G...  PREGABALIN 50 MG CAPSULE 08/30/2022 30 60 each Micki Riley, MD [facility] [phone] - G...  PREGABALIN 50 MG CAPSULE 07/30/2022 30 60 each Sater, Pearletha Furl, MD [facility] [phone] - G...  PREGABALIN 50 MG CAPSULE 04/21/2022 90 180 each Sater, Pearletha Furl, MD [facility] [phone] - G.Marland KitchenMarland Kitchen

## 2023-04-01 ENCOUNTER — Other Ambulatory Visit: Payer: Self-pay | Admitting: Neurology

## 2023-04-02 NOTE — Telephone Encounter (Signed)
Last seen on 09/11/22 No 6 month follow up scheduled  Last filled on 12/29/22 #180 tablets (90 day supply) Rx pending to be signed

## 2023-04-03 ENCOUNTER — Telehealth: Payer: Self-pay | Admitting: Neurology

## 2023-04-03 NOTE — Telephone Encounter (Signed)
I called husband back, LMVM.

## 2023-04-03 NOTE — Telephone Encounter (Signed)
Pt's husband, Jeannett Senior [place] (on [place]) pt having pain in legs unable to walk even with assistance, occasional disorientation, confusion. Would like to schedule an appt with Dr. Epimenio Foot in person to check if her medication needs adjusting. Would like a call back.

## 2023-04-04 NOTE — Telephone Encounter (Signed)
I called and spoke pt husband.  He is asking for an in office appt for pt.  She is progressively getting worse he states.  Has not been into see any provider (in office) since Dr. Anne Hahn.  He feels that confusion is worse,  she is sleeping more  (20 hours a day sometimes), c/o severe pain legs, although pain all over. Cannot stand on her own now.  No change in medications he stated.

## 2023-04-04 NOTE — Telephone Encounter (Signed)
LMVM for husband pt appointment made on  04-15-2023 at 1500, arrive 1445.

## 2023-04-10 DIAGNOSIS — Z03818 Encounter for observation for suspected exposure to other biological agents ruled out: Secondary | ICD-10-CM | POA: Diagnosis not present

## 2023-04-10 DIAGNOSIS — Z9359 Other cystostomy status: Secondary | ICD-10-CM | POA: Diagnosis not present

## 2023-04-10 DIAGNOSIS — R5383 Other fatigue: Secondary | ICD-10-CM | POA: Diagnosis not present

## 2023-04-10 DIAGNOSIS — R509 Fever, unspecified: Secondary | ICD-10-CM | POA: Diagnosis not present

## 2023-04-10 DIAGNOSIS — R3989 Other symptoms and signs involving the genitourinary system: Secondary | ICD-10-CM | POA: Diagnosis not present

## 2023-04-10 DIAGNOSIS — G8222 Paraplegia, incomplete: Secondary | ICD-10-CM | POA: Diagnosis not present

## 2023-04-15 ENCOUNTER — Other Ambulatory Visit: Payer: Self-pay | Admitting: Neurology

## 2023-04-15 ENCOUNTER — Telehealth: Payer: Self-pay | Admitting: Neurology

## 2023-04-15 ENCOUNTER — Encounter: Payer: Self-pay | Admitting: Neurology

## 2023-04-15 ENCOUNTER — Ambulatory Visit: Payer: PPO | Admitting: Neurology

## 2023-04-15 VITALS — BP 106/72 | HR 87 | Ht 62.0 in

## 2023-04-15 DIAGNOSIS — G629 Polyneuropathy, unspecified: Secondary | ICD-10-CM | POA: Diagnosis not present

## 2023-04-15 DIAGNOSIS — G8222 Paraplegia, incomplete: Secondary | ICD-10-CM

## 2023-04-15 DIAGNOSIS — N319 Neuromuscular dysfunction of bladder, unspecified: Secondary | ICD-10-CM | POA: Diagnosis not present

## 2023-04-15 DIAGNOSIS — G0490 Encephalitis and encephalomyelitis, unspecified: Secondary | ICD-10-CM | POA: Diagnosis not present

## 2023-04-15 DIAGNOSIS — R269 Unspecified abnormalities of gait and mobility: Secondary | ICD-10-CM | POA: Diagnosis not present

## 2023-04-15 MED ORDER — LAMOTRIGINE 25 MG PO TABS: ORAL_TABLET | ORAL | 5 refills | Status: DC

## 2023-04-15 MED ORDER — TRAZODONE HCL 50 MG PO TABS: 50.0000 mg | ORAL_TABLET | Freq: Every day | ORAL | 3 refills | Status: DC

## 2023-04-15 NOTE — Telephone Encounter (Signed)
Pharmacy left a vm at 3:53 asking for a call to discuss pt's previous therapy before they are able to fill recent script. Please call pharmacy, and speak with pharmacist.

## 2023-04-15 NOTE — Patient Instructions (Signed)
The pharmacy has the prescription of lamotrigine 25 mg. Please take it as follows: For 1 week take 1 pill once a day. The second week, take 1 pill twice a day. The third week, take 1 pill in the morning and 2 at bedtime or evening. The fourth week take 2 pills twice a day.  Most people tolerate lamotrigine very well. Some will get a rash.  If you do get a significant rash stop the medicine immediately and let us know.  

## 2023-04-15 NOTE — Progress Notes (Signed)
[facility]  PATIENT: Katelyn Lamb DOB: July 30, 1946  REFERRING DOCTOR OR PCP:  Dr. Docia Chuck. SOURCE: Patient, husband, records from Dr. Anne Hahn, imaging and lab reports, multiple MRI images personally reviewed.  _________________________________     HISTORICAL  CHIEF COMPLAINT:  Chief Complaint  Patient presents with   Follow-up    Pt in room 11. Husband in room and caretaker. Here for follow up, husband said patient has been having pain in legs, confusion, occasional disorientation. Patient she has been having headaches just about everyday. Patient is not able to walk, pt is sleepy in the mornings makes it hard for patient to eat breakfast.     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 woman with h/o encephalomyelitis and chronic gait disorder and neurogenic bladder.    She had an admission for urosepsis early 2024.      She is bedbound/chair bound.  She helps a little bit with transfers but is not able to transfer independently.  Weakness is severe in the legs and mild in the arms.  She is able to feed herself and hold a cup.    She has burning dysesthesias in her limbs.  Lyrica had helped.   She tried Keppra but it did not help.     She is also on Duloxetine 60 mg po.   She did not note any difference between taking twice a day and once a day  She has a suprapubic catheter.   No recent UTI.   Bladder spasms are better with Myrbetriq.      She is sleepy during the day but not sleeping well at night.   Quetipiine initialy helped her sleep but not the sleepiness during the dat.   Now it is not helping er much at night.   .      She is taking prednisone 5 mg po qd and acyclovir.   She feels she is functioning better with these medications than when she was not on them..   She takes Seroquel 100 mg po qHS and diazepam 5 mg bid (for tremors/shakiness).  She felt much worse when Valium  was reduced in the past.        History of encephalitis     November 2017, she began developing headaches. She had a brain MRI w/o contrast done in November 2017 for this and this showed numerous supratentorial white matter abnormalities and an abnormality in the pons. Then in December, she developed back pain and used a heating pad on her back and realized that she could not feel that heat. Shortly thereafter, she developed left abdominal pain. However, she traveled to [facility] to visit her friend and during the trip she developed numbness in her legs that progressed over a week or two. By the time she returned home in early January 2018, she was using a wheel chair in the airport and having difficulty walking. She went to the ER twice and was sent home. Around that time, she had a lumbar MRI that showed a protruding disc and initially, it was felt that her symptoms were due to that. However, she lost control of her bowl/bladder and developed a left leg rash and this brought her back to the ER. This time, she was admitted at [facility] from Jan 18- 24. She had an LP that showed 490 nucleated cells, 34 RBCs, 96 glucose and 53 protein. HSV2 PCR from the CSF  was positive. Serum NMO negative, anca panel was negative. C/T spine MRI demonstrated longitudinally extensive T2 abnormalities and enhancement. Brain MRI had new lesions including frontal cortical ribboning on DWI with possible ADC correlate. She was diagnosed with HSV2 encephalomyelitis and treated with IV acyclovir and high dose steroids. She was discharged home with a pic line to continue acyclovir for 21 days and steroids were not continued. Within a couple of days following discharge, she began having numbness,and she reports involuntary movements of her arms. She was readmitted to the hospital on Jan 20 and IV acyclovir was continued and she was restarted on steroids. She did better and went to inpatient rehab for 18 days and then was discharged home with home  health PT/OT.   Due to fluctuations, she has been maintained on oral acyclovir.    IMAGING: MRI of the cervical spine 09/15/2021 shows spinal cord atrophy, T2 hyperintense signal in the posterior columns adjacent to C3-C4 and patchy signal below C6 posteriorly.  There is moderate spinal stenosis at C5-C6 and milder spinal stenosis at C4-C5 and C6-C7.  This study was done without contrast.  MRI of the brain 09/15/2021 shows confluent T2 hyperintense abnormal signal predominantly in the frontal greater than parietal lobes extending from the periventricular to the subcortical white matter.  there is also abnormal signal within the pons.  No change compared to the previous MRI from July 2022.  MRI of the cervical spine with and without contrast 02/01/2020 shows spinal cord encephalomalacia adjacent to C3 to C4 and increased signal in a patchy manner in the lower cervical spinal cord.  Moderate spinal stenosis at C5-C6.  Normal enhancement pattern.  MRI of the brain 03/14/2018 showed T2/FLAIR hyperintense foci in the hemispheres and in the pons with large confluencies in the frontal lobes.  The right frontal lobe changes have increased when compared to the 12/25/2016 MRI while the changes elsewhere are more stable.  These findings are consistent with chronic microvascular ischemic changes with superimposed more recent right frontal findings that could be due to the history of encephalomyelitis or chronic stroke.  Atrophy had increased compared to the 12/25/2016 MRI.  Normal enhancement pattern.  MRI of the cervical spine 03/14/2018 showed a focus at the cervicomedullary junction, a larger posterior focus adjacent to C3-C4 and central/posterior focus at C6.  Moderately severe spinal stenosis at C5-C6 and mild spinal stenosis at C4-C5 and C6-C7.  MRI of the cervical spine 12/25/2016 shows an enhancing focus at C3-C4 posteriorly with nonenhancing foci at the cervicomedullary junction and at C6-C7.  Moderate spinal  stenosis at C5-C6 and moderate spinal stenosis at C4-C5 and C6-C7.  MRI of the head 10/03/2016 showed patchy hyperintense foci in the hemispheres (significantly less than seen 2019 and later).  No enhancement.  Foci in the pons consistent with chronic microvascular ischemic changes.  Ribbon cortical DWI changes  MRI of the cervical and thoracic spine showed a longitudinal extensive transverse myelitis from C2 through C4C5 associated with enlargement of spinal cord and other foci adjacent to C6-C7 and T1.  In the thoracic spine, additionally, there is increased signal from T5-T7.  These enhance after contrast.  PERTINENT LABS: -- CSF Analysis:  09/2016: 490 nucleated cells, 34 RBCs, 96 glucose and 53 protein. HSV2 PCR from the CSF was positive.   -- Other Studies:  09/2016- NMO AB neg.,ANCA panel neg, ANA neg,ENA neg, ACE neg  REVIEW OF SYSTEMS: Constitutional: No fevers, chills, sweats, or change in appetite Eyes: No visual changes, double vision,  eye pain Ear, nose and throat: No hearing loss, ear pain, nasal congestion, sore throat Cardiovascular: No chest pain, palpitations Respiratory:  No shortness of breath at rest or with exertion.   No wheezes GastrointestinaI: No nausea, vomiting, diarrhea, abdominal pain, fecal incontinence Genitourinary:  .  Suprapubic catheter for urinary retention Musculoskeletal:  No neck pain, back pain Integumentary: No rash, pruritus, skin lesions Neurological: as above Psychiatric: No depression at this time.  No anxiety Endocrine: No palpitations, diaphoresis, change in appetite, change in weigh or increased thirst Hematologic/Lymphatic:  No anemia, purpura, petechiae. Allergic/Immunologic: No itchy/runny eyes, nasal congestion, recent allergic reactions, rashes  ALLERGIES: Allergies  Allergen Reactions   Demerol [Meperidine] Other (See Comments)    Hallucinations   Hydrocodone-Acetaminophen Other (See Comments)   Percocet [Oxycodone-Acetaminophen]  Itching   Amoxicillin-Pot Clavulanate Diarrhea    Severe pain, headache, intestinal infection   Penicillins Itching and Rash    Tolerated amoxicillin November 2022  Has patient had a PCN reaction causing immediate rash, facial/tongue/throat swelling, SOB or lightheadedness with hypotension:  NO Has patient had a PCN reaction causing severe rash involving mucus membranes or skin necrosis: No Has patient had a PCN reaction that required hospitalization: No Has patient had a PCN reaction occurring within the last 10 years: Yes If all of the above answers are "NO", then may proceed with Cephalosporin use.    HOME MEDICATIONS:  Current Outpatient Medications:    acyclovir (ZOVIRAX) 400 MG tablet, TAKE 1 TABLET BY MOUTH 2 TIMES DAILY., Disp: 180 tablet, Rfl: 3   albuterol (PROVENTIL) (2.5 MG/3ML) 0.083% nebulizer solution, Take 3 mLs (2.5 mg total) by nebulization every 2 (two) hours as needed for wheezing or shortness of breath., Disp: 75 mL, Rfl: 12   apixaban (ELIQUIS) 5 MG TABS tablet, Take 1 tablet (5 mg total) by mouth 2 (two) times daily., Disp: 60 tablet, Rfl:    Ascorbic Acid (VITAMIN C) 500 MG CHEW, Chew 500 mg by mouth daily., Disp: , Rfl:    Calcium Carb-Cholecalciferol (CALCIUM 600 + D PO), Take 1 tablet by mouth in the morning and at bedtime., Disp: , Rfl:    Cyanocobalamin (VITAMIN B-12 PO), Take 500 mcg by mouth daily., Disp: , Rfl:    diazepam (VALIUM) 5 MG tablet, TAKE 1 TABLET (5 MG TOTAL) BY MOUTH EVERY 12 (TWELVE) HOURS AS NEEDED (FOR TREMORS)., Disp: 180 tablet, Rfl: 0   DULoxetine (CYMBALTA) 60 MG capsule, Take 1 capsule (60 mg total) by mouth at bedtime., Disp: 90 capsule, Rfl: 3   feeding supplement (ENSURE ENLIVE / ENSURE PLUS) LIQD, Take 237 mLs by mouth 2 (two) times daily between meals., Disp: 237 mL, Rfl: 12   ferrous sulfate 325 (65 FE) MG tablet, Take 325 mg by mouth every morning., Disp: , Rfl:    fluticasone-salmeterol (ADVAIR) 100-50 MCG/ACT AEPB, Inhale 1 puff  into the lungs 2 (two) times daily., Disp: 60 each, Rfl: 5   mirabegron ER (MYRBETRIQ) 50 MG TB24 tablet, Take 50 mg by mouth at bedtime., Disp: , Rfl:    omega-3 acid ethyl esters (LOVAZA) 1 g capsule, Take 1 g by mouth every morning., Disp: , Rfl:    pantoprazole (PROTONIX) 40 MG tablet, Take 1 tablet (40 mg total) by mouth daily., Disp: 30 tablet, Rfl: 1   predniSONE (DELTASONE) 5 MG tablet, TAKE 1 TABLET BY MOUTH EVERY DAY WITH BREAKFAST, Disp: 90 tablet, Rfl: 0   pregabalin (LYRICA) 50 MG capsule, TAKE 1 CAPSULE BY MOUTH 2 TIMES DAILY., Disp:  180 capsule, Rfl: 1   QUEtiapine (SEROQUEL) 50 MG tablet, Take 1 tablet (50 mg total) by mouth at bedtime., Disp: 90 tablet, Rfl: 1   midodrine (PROAMATINE) 5 MG tablet, Take 1 tablet (5 mg total) by mouth 2 (two) times daily with a meal. (Patient not taking: Reported on 04/15/2023), Disp: 60 tablet, Rfl: 0  PAST MEDICAL HISTORY: Past Medical History:  Diagnosis Date   Acute deep vein thrombosis (DVT) of popliteal vein of left lower extremity (HCC)    Anxiety    Back pain    Chronic kidney disease    Encephalomyelitis    Gait abnormality 11/14/2016   Memory difficulty 03/02/2019   Myelitis due to herpes simplex (HCC)    Neurogenic bladder     PAST SURGICAL HISTORY: Past Surgical History:  Procedure Laterality Date   ABDOMINAL HYSTERECTOMY     BLADDER REPAIR     CESAREAN SECTION     IR ANGIO EXTERNAL CAROTID SEL EXT CAROTID BILAT MOD SED  07/18/2021   IR ANGIO INTRA EXTRACRAN SEL INTERNAL CAROTID BILAT MOD SED  07/18/2021   IR KYPHO LUMBAR INC FX REDUCE BONE BX UNI/BIL CANNULATION INC/IMAGING  04/11/2018   IR NEURO EACH ADD'L AFTER BASIC UNI LEFT (MS)  07/18/2021   IR NEURO EACH ADD'L AFTER BASIC UNI RIGHT (MS)  07/18/2021   IR TRANSCATH/EMBOLIZ  07/18/2021   IR US GUIDE VASC ACCESS RIGHT  07/18/2021   RADIOLOGY WITH ANESTHESIA N/A 07/18/2021   Procedure: IR WITH ANESTHESIA;  Surgeon: Baldemar Lenis, MD;  Location: [facility] OR;   Service: Radiology;  Laterality: N/A;   TUBAL LIGATION      FAMILY HISTORY: Family History  Problem Relation Age of Onset   Hypertension Mother     SOCIAL HISTORY:  Social History   Socioeconomic History   Marital status: Married    Spouse name: Brett Canales   Number of children: 2   Years of education: 12   Highest education level: Not on file  Occupational History   Occupation: retired  Tobacco Use   Smoking status: Former    Current packs/day: 0.00    Average packs/day: 2.0 packs/day for 10.0 years (20.0 ttl pk-yrs)    Types: Cigarettes    Start date: 09/03/1969    Quit date: 09/04/1979    Years since quitting: 43.6   Smokeless tobacco: Never   Tobacco comments:    40 years ago   Vaping Use   Vaping status: Never Used  Substance and Sexual Activity   Alcohol use: No   Drug use: Never   Sexual activity: Not Currently  Other Topics Concern   Not on file  Social History Narrative   Lives with husband   Caffeine 2-3 cups daily   Right-handed   Social Determinants of Health   Financial Resource Strain: Not on file  Food Insecurity: No Food Insecurity (06/28/2022)   Hunger Vital Sign    Worried About Running Out of Food in the Last Year: Never true    Ran Out of Food in the Last Year: Never true  Transportation Needs: No Transportation Needs (06/28/2022)   PRAPARE - Administrator, Civil Service (Medical): No    Lack of Transportation (Non-Medical): No  Physical Activity: Not on file  Stress: Not on file  Social Connections: Not on file  Intimate Partner Violence: Not At Risk (06/28/2022)   Humiliation, Afraid, Rape, and Kick questionnaire    Fear of Current or Ex-Partner: No  Emotionally Abused: No    Physically Abused: No    Sexually Abused: No     PHYSICAL EXAM  She is a bedbound well-developed well-nourished woman in no acute distress.  The head is normocephalic and atraumatic.  Sclera are anicteric.  Visible skin appears normal.  The neck has  a good range of motion.  Facial strength seems symmetric.    DIAGNOSTIC DATA (LABS, IMAGING, TESTING) - I reviewed patient records, labs, notes, testing and imaging myself where available.  Lab Results  Component Value Date   WBC 11.3 (H) 07/03/2022   HGB 9.1 (L) 07/03/2022   HCT 29.5 (L) 07/03/2022   MCV 100.7 (H) 07/03/2022   PLT 197 07/03/2022      Component Value Date/Time   NA 140 07/02/2022 0449   NA 137 03/02/2019 1612   K 4.2 07/02/2022 0449   CL 105 07/02/2022 0449   CO2 30 07/02/2022 0449   GLUCOSE 85 07/02/2022 0449   BUN 8 07/02/2022 0449   BUN 8 03/02/2019 1612   CREATININE 0.64 07/02/2022 0449   CREATININE 0.80 01/08/2017 1449   CALCIUM 8.2 (L) 07/02/2022 0449   PROT 5.4 (L) 06/28/2022 0351   PROT 6.8 03/02/2019 1612   ALBUMIN 2.7 (L) 07/02/2022 0449   ALBUMIN 4.2 03/02/2019 1612   AST 41 06/28/2022 0351   ALT 35 06/28/2022 0351   ALKPHOS 58 06/28/2022 0351   BILITOT 0.5 06/28/2022 0351   BILITOT 0.2 03/02/2019 1612   GFRNONAA >60 07/02/2022 0449   GFRAA >60 02/17/2020 0453   Lab Results  Component Value Date   CHOL 150 09/16/2021   HDL 48 09/16/2021   LDLCALC 76 09/16/2021   TRIG 130 09/16/2021   CHOLHDL 3.1 09/16/2021   Lab Results  Component Value Date   HGBA1C 5.2 06/27/2022   Lab Results  Component Value Date   VITAMINB12 1,261 (H) 06/29/2022   Lab Results  Component Value Date   TSH 1.817 03/15/2021    PHYSICAL EXAM  Vitals:   04/15/23 1442  BP: 106/72  Pulse: 87  Height: 5\' 2"  (1.575 m)    Body mass index is 31.09 kg/m.   General: The patient is well-developed and well-nourished and in no acute distress.  Head is [place]/AT  Eyes: Sclera are anicteric.  Neck: The neck is supple.  The neck has good range of motion..   Skin: Extremities are without rash or  edema.  Musculoskeletal:  Back is nontender  Neurologic Exam  Mental status: The patient is alert and oriented x 2 at the time of the examination.  Focus of  short-term memory was mildly reduced.   Speech is normal.  Cranial nerves: Extraocular movements are full.  There is good facial sensation to soft touch bilaterally.Facial strength is normal.  Trapezius and sternocleidomastoid strength is normal. No dysarthria is noted.  The tongue is midline, and the patient has symmetric elevation of the soft palate. No obvious hearing deficits are noted.  Motor:  Muscle bulk is normal.   Tone is normal. Strength is  5 / 5 in all 4 extremities in the arms and 4+/5 in the proximal legs and ankle extension and 4/5 with extension..   Sensory: Sensory testing is intact to pinprick, soft touch and vibration sensation in the arms but she reported reduced sensation to touch and vibration in the feet.  Vibration sensation was moderately reduced at the ankles and very reduced at the toes compared to the knees  Coordination: Cerebellar testing reveals good  finger-nose-finger.  Gait and station: She is not able to stand.   Reflexes: Deep tendon reflexes are symmetric and normal bilaterally.   Plantar responses are flexor.     ASSESSMENT AND PLAN  Incomplete paraplegia (HCC)  Encephalomyelitis  Gait abnormality  Seizure disorder (HCC)  Neurogenic bladder   Continue Lyrica 50 mg po bid (afternoon and night).   Continue acyclovir and 5 mg daily prednisone..  Change nighttime Seroquel to trazodone  Add lamotrigine and titrate up to 50 mg p.o. twice daily for dysesthesias.  This dose can be increased further if well-tolerated.   Continue diazepam for spasticity.  Suprapubic catheter managed by urology. Unclear if there is a polyneuropathy superimposed on her spinal cord symptoms.  Will check labs for treatable causes  return in 6 months or sooner if there are new or worsening neurologic symptoms.    40 minute office visit with the majority of the time spent f on video for history and physical, discussion/counseling and decision-making.  Additional time with  extensive record review including multiple MRIs personally reviewed and documentation.  This visit is part of a comprehensive longitudinal care medical relationship regarding the patients primary diagnosis of encephalitis and related concerns.    A. Epimenio Foot, MD, [facility] 04/15/2023, 3:13 PM Certified in Neurology, Clinical Neurophysiology, Sleep Medicine and Neuroimaging  [facility] [address], [address] [place], [place] [address] [phone]

## 2023-04-15 NOTE — Telephone Encounter (Signed)
Called pharmacy and they said the titration dose needs to be attached to Rx. Rx sent with titration dose based on office note today.

## 2023-04-18 ENCOUNTER — Encounter: Payer: Self-pay | Admitting: Neurology

## 2023-04-18 ENCOUNTER — Ambulatory Visit (HOSPITAL_BASED_OUTPATIENT_CLINIC_OR_DEPARTMENT_OTHER): Payer: PPO | Admitting: Pulmonary Disease

## 2023-04-18 DIAGNOSIS — N319 Neuromuscular dysfunction of bladder, unspecified: Secondary | ICD-10-CM | POA: Diagnosis not present

## 2023-04-18 DIAGNOSIS — M47813 Spondylosis without myelopathy or radiculopathy, cervicothoracic region: Secondary | ICD-10-CM | POA: Diagnosis not present

## 2023-04-18 DIAGNOSIS — I700 Atherosclerosis of aorta: Secondary | ICD-10-CM | POA: Diagnosis not present

## 2023-04-18 DIAGNOSIS — F331 Major depressive disorder, recurrent, moderate: Secondary | ICD-10-CM | POA: Diagnosis not present

## 2023-04-18 DIAGNOSIS — T83030D Leakage of cystostomy catheter, subsequent encounter: Secondary | ICD-10-CM | POA: Diagnosis not present

## 2023-04-18 DIAGNOSIS — M810 Age-related osteoporosis without current pathological fracture: Secondary | ICD-10-CM | POA: Diagnosis not present

## 2023-04-18 DIAGNOSIS — N390 Urinary tract infection, site not specified: Secondary | ICD-10-CM | POA: Diagnosis not present

## 2023-04-18 DIAGNOSIS — N189 Chronic kidney disease, unspecified: Secondary | ICD-10-CM | POA: Diagnosis not present

## 2023-04-18 DIAGNOSIS — M170 Bilateral primary osteoarthritis of knee: Secondary | ICD-10-CM | POA: Diagnosis not present

## 2023-04-18 DIAGNOSIS — M4802 Spinal stenosis, cervical region: Secondary | ICD-10-CM | POA: Diagnosis not present

## 2023-04-18 DIAGNOSIS — G8222 Paraplegia, incomplete: Secondary | ICD-10-CM | POA: Diagnosis not present

## 2023-04-18 DIAGNOSIS — G40909 Epilepsy, unspecified, not intractable, without status epilepticus: Secondary | ICD-10-CM | POA: Diagnosis not present

## 2023-04-22 DIAGNOSIS — J449 Chronic obstructive pulmonary disease, unspecified: Secondary | ICD-10-CM | POA: Diagnosis not present

## 2023-04-22 DIAGNOSIS — G629 Polyneuropathy, unspecified: Secondary | ICD-10-CM | POA: Diagnosis not present

## 2023-04-22 DIAGNOSIS — G319 Degenerative disease of nervous system, unspecified: Secondary | ICD-10-CM | POA: Diagnosis not present

## 2023-04-22 DIAGNOSIS — Z9359 Other cystostomy status: Secondary | ICD-10-CM | POA: Diagnosis not present

## 2023-04-22 DIAGNOSIS — Z515 Encounter for palliative care: Secondary | ICD-10-CM | POA: Diagnosis not present

## 2023-04-22 DIAGNOSIS — F0390 Unspecified dementia without behavioral disturbance: Secondary | ICD-10-CM | POA: Diagnosis not present

## 2023-04-23 DIAGNOSIS — G8222 Paraplegia, incomplete: Secondary | ICD-10-CM | POA: Diagnosis not present

## 2023-04-23 DIAGNOSIS — M47814 Spondylosis without myelopathy or radiculopathy, thoracic region: Secondary | ICD-10-CM | POA: Diagnosis not present

## 2023-05-01 ENCOUNTER — Ambulatory Visit (HOSPITAL_BASED_OUTPATIENT_CLINIC_OR_DEPARTMENT_OTHER): Payer: PPO | Admitting: Pulmonary Disease

## 2023-05-01 ENCOUNTER — Telehealth (INDEPENDENT_AMBULATORY_CARE_PROVIDER_SITE_OTHER): Payer: PPO | Admitting: Pulmonary Disease

## 2023-05-01 ENCOUNTER — Encounter (HOSPITAL_BASED_OUTPATIENT_CLINIC_OR_DEPARTMENT_OTHER): Payer: Self-pay | Admitting: Pulmonary Disease

## 2023-05-01 DIAGNOSIS — J4551 Severe persistent asthma with (acute) exacerbation: Secondary | ICD-10-CM | POA: Diagnosis not present

## 2023-05-01 DIAGNOSIS — R339 Retention of urine, unspecified: Secondary | ICD-10-CM | POA: Diagnosis not present

## 2023-05-01 MED ORDER — PREDNISONE 10 MG PO TABS: ORAL_TABLET | ORAL | 0 refills | Status: AC

## 2023-05-01 MED ORDER — AZITHROMYCIN 250 MG PO TABS: ORAL_TABLET | ORAL | 0 refills | Status: DC

## 2023-05-01 NOTE — Progress Notes (Signed)
Virtual Visit via Video Note  I connected with Katelyn Lamb on 05/01/23 at  2:30 PM EDT by a video enabled telemedicine application and verified that I am speaking with the correct person using two identifiers.  Location: Patient: Home Provider: [facility]   I discussed the limitations of evaluation and management by telemedicine and the availability of in person appointments. The patient expressed understanding and agreed to proceed.   I discussed the assessment and treatment plan with the patient. The patient was provided an opportunity to ask questions and all were answered. The patient agreed with the plan and demonstrated an understanding of the instructions.   The patient was advised to call back or seek an in-person evaluation if the symptoms worsen or if the condition fails to improve as anticipated.  I provided 25 minutes of non-face-to-face time during this encounter.   Katelyn Navarrete Mechele Collin, MD  Video connection was lost when less than 50% of the duration of the visit was complete, at which time the remainder of the visit was completed via audio only.   Subjective:   PATIENT ID: Katelyn Lamb GENDER: female DOB: Aug 30, 1946, MRN: [id number]  Chief Complaint  Patient presents with   Follow-up    Reason for Visit: Follow-up chronic cough, acute telephone visit  [name] is a 77 year old female former smoker with HSV encephalomyelitis in 2018 with baseline gait dysfunction/incomplete paraplegia, hx DVT 2018 and PE 2021 on chronic AC, anxiety, insomnia, hx epistaxis requiring embolization 2022 who presents follow-up.  Initial consult Patient has a hx significant for hospitalization in 08/2022 for respiratory failure 2/2 pulmonary embolism  She was seen by her PCP for UTI on 01/03/23. Records reviewed. CXR on 07/2022 with nonspecific perihilar inflammatory changes and emphysema. Referred to pulmonary for chronic cough. On albuterol nebulizer and daily  PPI  She reports in the last 6 month intermittent chronic cough that is sometimes productive. Sometimes associated from upper wheezing. No clear triggers. Has not used regular rescue nebulizer. Has not tried maintenance inhalers.   05/01/23 Virtual video She has worsening productive cough in the last week. Has chest pain from coughing. Denies sinus issues. Unsure if inhaler is helping but has been compliant. Denies fevers or chills. Denies recent sick contacts but has been around grandkids. Using nebulizer 1-2x a day. Has not taken home covid test.  Social History: Previously smoked 2ppd. Quit in 1981 Retired Film/video editor   Past Medical History:  Diagnosis Date   Acute Lamb vein thrombosis (DVT) of popliteal vein of left lower extremity (HCC)    Anxiety    Back pain    Chronic kidney disease    Encephalomyelitis    Gait abnormality 11/14/2016   Memory difficulty 03/02/2019   Myelitis due to herpes simplex (HCC)    Neurogenic bladder      Family History  Problem Relation Age of Onset   Hypertension Mother      Social History   Occupational History   Occupation: retired  Tobacco Use   Smoking status: Former    Current packs/day: 0.00    Average packs/day: 2.0 packs/day for 10.0 years (20.0 ttl pk-yrs)    Types: Cigarettes    Start date: 09/03/1969    Quit date: 09/04/1979    Years since quitting: 43.6   Smokeless tobacco: Never   Tobacco comments:    40 years ago   Vaping Use   Vaping status: Never Used  Substance and Sexual Activity  Alcohol use: No   Drug use: Never   Sexual activity: Not Currently    Allergies  Allergen Reactions   Demerol [Meperidine] Other (See Comments)    Hallucinations   Hydrocodone-Acetaminophen Other (See Comments)   Percocet [Oxycodone-Acetaminophen] Itching   Amoxicillin-Pot Clavulanate Diarrhea    Severe pain, headache, intestinal infection   Penicillins Itching and Rash    Tolerated amoxicillin November 2022  Has patient had a  PCN reaction causing immediate rash, facial/tongue/throat swelling, SOB or lightheadedness with hypotension:  NO Has patient had a PCN reaction causing severe rash involving mucus membranes or skin necrosis: No Has patient had a PCN reaction that required hospitalization: No Has patient had a PCN reaction occurring within the last 10 years: Yes If all of the above answers are "NO", then may proceed with Cephalosporin use.     Outpatient Medications Prior to Visit  Medication Sig Dispense Refill   acyclovir (ZOVIRAX) 400 MG tablet TAKE 1 TABLET BY MOUTH 2 TIMES DAILY. 180 tablet 3   albuterol (PROVENTIL) (2.5 MG/3ML) 0.083% nebulizer solution Take 3 mLs (2.5 mg total) by nebulization every 2 (two) hours as needed for wheezing or shortness of breath. 75 mL 12   apixaban (ELIQUIS) 5 MG TABS tablet Take 1 tablet (5 mg total) by mouth 2 (two) times daily. 60 tablet    Ascorbic Acid (VITAMIN C) 500 MG CHEW Chew 500 mg by mouth daily.     Calcium Carb-Cholecalciferol (CALCIUM 600 + D PO) Take 1 tablet by mouth in the morning and at bedtime.     Cyanocobalamin (VITAMIN B-12 PO) Take 500 mcg by mouth daily.     diazepam (VALIUM) 5 MG tablet TAKE 1 TABLET (5 MG TOTAL) BY MOUTH EVERY 12 (TWELVE) HOURS AS NEEDED (FOR TREMORS). 180 tablet 0   DULoxetine (CYMBALTA) 60 MG capsule Take 1 capsule (60 mg total) by mouth at bedtime. 90 capsule 3   feeding supplement (ENSURE ENLIVE / ENSURE PLUS) LIQD Take 237 mLs by mouth 2 (two) times daily between meals. 237 mL 12   ferrous sulfate 325 (65 FE) MG tablet Take 325 mg by mouth every morning.     fluticasone-salmeterol (ADVAIR) 100-50 MCG/ACT AEPB Inhale 1 puff into the lungs 2 (two) times daily. 60 each 5   lamoTRIgine (LAMICTAL) 25 MG tablet For 1 week take 1 pill once a day. The second week, take 1 pill twice a day. The third week, take 1 pill in the morning and 2 at bedtime or evening. The fourth week take 2 pills twice a day. 120 tablet 5   midodrine  (PROAMATINE) 5 MG tablet Take 1 tablet (5 mg total) by mouth 2 (two) times daily with a meal. 60 tablet 0   mirabegron ER (MYRBETRIQ) 50 MG TB24 tablet Take 50 mg by mouth at bedtime.     omega-3 acid ethyl esters (LOVAZA) 1 g capsule Take 1 g by mouth every morning.     pantoprazole (PROTONIX) 40 MG tablet Take 1 tablet (40 mg total) by mouth daily. 30 tablet 1   predniSONE (DELTASONE) 5 MG tablet TAKE 1 TABLET BY MOUTH EVERY DAY WITH BREAKFAST 90 tablet 0   pregabalin (LYRICA) 50 MG capsule TAKE 1 CAPSULE BY MOUTH 2 TIMES DAILY. 180 capsule 1   traZODone (DESYREL) 50 MG tablet Take 1 tablet (50 mg total) by mouth at bedtime. 90 tablet 3   No facility-administered medications prior to visit.    Review of Systems  Constitutional:  Negative for  chills, diaphoresis, fever, malaise/fatigue and weight loss.  HENT:  Negative for congestion.   Respiratory:  Positive for cough, sputum production, shortness of breath and wheezing. Negative for hemoptysis.   Cardiovascular:  Negative for chest pain, palpitations and leg swelling.     Objective:   There were no vitals filed for this visit.     Physical Exam: General: No acute distress on the phone  Data Reviewed:  Imaging: CXR 08/02/22 (report only) : Impression - 1. Nonspecific perihilar inflammatory changes. 2. Emphysema  PFT: None on file  Labs:  OSH 01/03/23 Absolute eos - 400    Assessment & Plan:   Discussion: 77 year old female former smoker with HSV encephalomyelitis in 2018 with baseline gait dysfunction/incomplete paraplegia, hx DVT 2018 and PE 2021 on chronic AC, anxiety, insomnia, hx epistaxis requiring embolization 2022 who presents for asthma exacerbation.   Followed for chronic cough. Discussed causes of cough including airway disease (e.g. COPD, asthma), UACS and reflux. Would be a poor candidate for PFTs due to wheelchair bound status and core weakness to tolerate testing. Consider increasing ICS/LABA when acute  illness is resolved. Wheezing could be related to lungs but also upper airways may need to be evaluated if inhalers are ineffective.   Cough variant asthma - Chronic cough/wheezing Asthma exacerbation --Advised home covid test --Prednisone taper --START azithromycin --CONTINUE Advair 100-50 mcg ONE puff in the morning and evening. Rinse mouth out after use --CONTINUE Albuterol nebulizer AS NEEDED for shortness of breath or wheezing --If symptoms do not improve in 48-72 hours, advised medical evaluation in the ER  Hx recurrent PE/DVT --Continue eliquis 5 mg twice a day  Hx HSV encephalomyelitis with chronic gait dysfunction Chronic steroid use --Prednisone 5 mg daily. On daily PPI --Followed by Neurology  Health Maintenance Immunization History  Administered Date(s) Administered   PFIZER(Purple Top)SARS-COV-2 Vaccination 09/23/2019, 10/14/2019   Pneumococcal Conjugate-13 02/11/2015   Pneumococcal Polysaccharide-23 06/12/2016   CT Lung Screen - not qualified. Quit >15 years  No orders of the defined types were placed in this encounter.  Meds ordered this encounter  Medications   predniSONE (DELTASONE) 10 MG tablet    Sig: Take 4 tablets (40 mg total) by mouth daily with breakfast for 2 days, THEN 3 tablets (30 mg total) daily with breakfast for 2 days, THEN 2 tablets (20 mg total) daily with breakfast for 2 days, THEN 1 tablet (10 mg total) daily with breakfast for 2 days.    Dispense:  20 tablet    Refill:  0   azithromycin (ZITHROMAX) 250 MG tablet    Sig: Take two tablets on day 1 followed by one tablet daily for days 2-5    Dispense:  6 tablet    Refill:  0    Return in about 3 months (around 08/01/2023).  I have spent a total time of 25-minutes on the day of the appointment including chart review, data review, collecting history, coordinating care and discussing medical diagnosis and plan with the patient/family. Past medical history, allergies, medications were  reviewed. Pertinent imaging, labs and tests included in this note have been reviewed and interpreted independently by me.  Beuford Garcilazo Mechele Collin, MD [facility] 05/01/2023 4:56 PM  Office Number [phone]

## 2023-05-01 NOTE — Patient Instructions (Addendum)
Asthma exacerbation --Advised home covid test --Prednisone taper --START azithromycin --CONTINUE Advair 100-50 mcg ONE puff in the morning and evening. Rinse mouth out after use --CONTINUE Albuterol nebulizer AS NEEDED for shortness of breath or wheezing

## 2023-05-27 ENCOUNTER — Ambulatory Visit
Admission: RE | Admit: 2023-05-27 | Discharge: 2023-05-27 | Disposition: A | Discharge status: Home / Self Care | Payer: PPO | Source: Ambulatory Visit | Attending: Family Medicine | Admitting: Family Medicine

## 2023-05-27 DIAGNOSIS — R7401 Elevation of levels of liver transaminase levels: Secondary | ICD-10-CM

## 2023-05-27 DIAGNOSIS — K824 Cholesterolosis of gallbladder: Secondary | ICD-10-CM | POA: Diagnosis not present

## 2023-06-03 ENCOUNTER — Other Ambulatory Visit: Payer: Self-pay | Admitting: Neurology

## 2023-06-03 NOTE — Telephone Encounter (Signed)
Last seen on 04/15/23 Follow up scheduled on 10/30/23

## 2023-06-14 DIAGNOSIS — R339 Retention of urine, unspecified: Secondary | ICD-10-CM | POA: Diagnosis not present

## 2023-06-17 DIAGNOSIS — I700 Atherosclerosis of aorta: Secondary | ICD-10-CM | POA: Diagnosis not present

## 2023-06-17 DIAGNOSIS — F331 Major depressive disorder, recurrent, moderate: Secondary | ICD-10-CM | POA: Diagnosis not present

## 2023-06-17 DIAGNOSIS — N189 Chronic kidney disease, unspecified: Secondary | ICD-10-CM | POA: Diagnosis not present

## 2023-06-17 DIAGNOSIS — G40909 Epilepsy, unspecified, not intractable, without status epilepticus: Secondary | ICD-10-CM | POA: Diagnosis not present

## 2023-06-17 DIAGNOSIS — M170 Bilateral primary osteoarthritis of knee: Secondary | ICD-10-CM | POA: Diagnosis not present

## 2023-06-17 DIAGNOSIS — G8222 Paraplegia, incomplete: Secondary | ICD-10-CM | POA: Diagnosis not present

## 2023-06-17 DIAGNOSIS — M810 Age-related osteoporosis without current pathological fracture: Secondary | ICD-10-CM | POA: Diagnosis not present

## 2023-06-17 DIAGNOSIS — T83030D Leakage of cystostomy catheter, subsequent encounter: Secondary | ICD-10-CM | POA: Diagnosis not present

## 2023-06-17 DIAGNOSIS — N319 Neuromuscular dysfunction of bladder, unspecified: Secondary | ICD-10-CM | POA: Diagnosis not present

## 2023-06-17 DIAGNOSIS — N390 Urinary tract infection, site not specified: Secondary | ICD-10-CM | POA: Diagnosis not present

## 2023-06-17 DIAGNOSIS — M4802 Spinal stenosis, cervical region: Secondary | ICD-10-CM | POA: Diagnosis not present

## 2023-06-17 DIAGNOSIS — M47813 Spondylosis without myelopathy or radiculopathy, cervicothoracic region: Secondary | ICD-10-CM | POA: Diagnosis not present

## 2023-06-21 DIAGNOSIS — R339 Retention of urine, unspecified: Secondary | ICD-10-CM | POA: Diagnosis not present

## 2023-06-30 ENCOUNTER — Other Ambulatory Visit: Payer: Self-pay | Admitting: Neurology

## 2023-06-30 DIAGNOSIS — R269 Unspecified abnormalities of gait and mobility: Secondary | ICD-10-CM

## 2023-06-30 DIAGNOSIS — G0490 Encephalitis and encephalomyelitis, unspecified: Secondary | ICD-10-CM

## 2023-06-30 DIAGNOSIS — M792 Neuralgia and neuritis, unspecified: Secondary | ICD-10-CM

## 2023-07-01 NOTE — Telephone Encounter (Signed)
Last seen on 04/15/23 Follow up scheduled on 10/30/23 Valium last filled on 04/02/23 #180 tablets (90 day supply) Rx pending to be signed

## 2023-09-28 ENCOUNTER — Other Ambulatory Visit: Payer: Self-pay | Admitting: Neurology

## 2023-09-28 DIAGNOSIS — G8222 Paraplegia, incomplete: Secondary | ICD-10-CM

## 2023-09-28 DIAGNOSIS — G0490 Encephalitis and encephalomyelitis, unspecified: Secondary | ICD-10-CM

## 2023-10-06 ENCOUNTER — Other Ambulatory Visit: Payer: Self-pay | Admitting: Neurology

## 2023-10-07 NOTE — Telephone Encounter (Signed)
Last 04/15/23, next appt  not scheduled, sater's last note stated: Continue diazepam for spasticity.  Suprapubic catheter managed by urology.  Routing to md for sign off Dispenses   Dispensed Days Supply Quantity Provider Pharmacy  DIAZEPAM 5 MG TABLET 09/04/2023 30 60 each Sater, Pearletha Furl, MD [facility] [phone] - [facility]...  DIAZEPAM 5 MG TABLET 08/02/2023 30 60 each Sater, Pearletha Furl, MD [facility] [phone] - G...  DIAZEPAM 5 MG TABLET 07/02/2023 30 60 each Sater, Pearletha Furl, MD [facility] [phone] - G...  diazepam (VALIUM) tablet 07/01/2023 90 180 Sater, Pearletha Furl, MD [facility] [phone] - G...  DIAZEPAM 5 MG TABLET 04/02/2023 90 180 each Sater, Pearletha Furl, MD [facility] [phone] - G...  DIAZEPAM 5 MG TABLET 12/29/2022 90 180 each Sater, Pearletha Furl, MD [facility] [phone] - G.Marland KitchenMarland Kitchen

## 2023-10-08 ENCOUNTER — Telehealth: Payer: PPO | Admitting: Neurology

## 2023-10-23 ENCOUNTER — Other Ambulatory Visit: Payer: Self-pay | Admitting: Neurology

## 2023-10-23 DIAGNOSIS — G9340 Encephalopathy, unspecified: Secondary | ICD-10-CM

## 2023-10-23 NOTE — Telephone Encounter (Signed)
 Dr. Epimenio Foot- please see message before refilling/approving  Last seen 04/15/23. Has no f/u. After reviewing, we cx appt 10/30/23 d/t provider being out. I called and spoke w/ husband. They could not take morning appt 10/30/23. Requested another day. Scheduled mychart VV 11/05/23 at 3pm w/ Dr. Epimenio Foot.   He did state she is now under hospice care. This started end of summer after visit 04/2023. Not sure Dr. Epimenio Foot was aware. I did relay that typically all care taken over by hospice. He wanted me to make sure with Dr. Epimenio Foot. Aware I will send and call back with his respones.   Last refilled pregabalin 09/20/23 #60.

## 2023-10-30 ENCOUNTER — Ambulatory Visit: Payer: PPO | Admitting: Neurology

## 2023-11-05 ENCOUNTER — Telehealth (INDEPENDENT_AMBULATORY_CARE_PROVIDER_SITE_OTHER): Payer: PPO | Admitting: Neurology

## 2023-11-05 ENCOUNTER — Encounter: Payer: Self-pay | Admitting: Neurology

## 2023-11-05 ENCOUNTER — Telehealth: Payer: Self-pay | Admitting: Neurology

## 2023-11-05 DIAGNOSIS — N319 Neuromuscular dysfunction of bladder, unspecified: Secondary | ICD-10-CM

## 2023-11-05 DIAGNOSIS — G0490 Encephalitis and encephalomyelitis, unspecified: Secondary | ICD-10-CM

## 2023-11-05 DIAGNOSIS — G8222 Paraplegia, incomplete: Secondary | ICD-10-CM | POA: Diagnosis not present

## 2023-11-05 DIAGNOSIS — M792 Neuralgia and neuritis, unspecified: Secondary | ICD-10-CM

## 2023-11-05 DIAGNOSIS — R269 Unspecified abnormalities of gait and mobility: Secondary | ICD-10-CM | POA: Diagnosis not present

## 2023-11-05 NOTE — Progress Notes (Signed)
 [facility]  PATIENT: Katelyn Lamb DOB: February 06, 1946  REFERRING DOCTOR OR PCP:  Dr. Docia Chuck. SOURCE: Patient, husband,  imaging and lab reports, multiple MRI images personally reviewed.  _________________________________  Virtual Visit via Video Note I connected with [name] on 11/05/23 at  3:00 PM EST by a video enabled telemedicine application and verified that I am speaking with the correct person.  I discussed the limitations of evaluation and management by telemedicine and the availability of in person appointments. The patient expressed understanding and agreed to proceed.  Patient and husband were in their home.  Provider was in the office.  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 woman with h/o encephalomyelitis (2017) and chronic gait disorder and neurogenic bladder.    She had an admission for urosepsis early 2024 but no hospital admission since that one.    She is bedbound/chair bound.  She helps a little bit with transfers but is not able to transfer independently.  Weakness is severe in the legs and mild in the arms.  She is able to feed herself and hold a cup.    She has burning dysesthesias in her limbs.  Lyrica had helped.   She tried Keppra but it did not help.     She is also on Duloxetine 60 mg po.   She did not note any difference between taking twice a day and once a day.  She notes pain in her sacrum.  She has no skin breakdown  She has a suprapubic catheter.   No recent UTI.   Bladder spasms are better with baclofen.  .      She is sleeping better at night and has less daytime sleepiness.   She takes trazodone and feels it helps more than quetiapine.     She is taking prednisone 5 mg po qd and acyclovir.   She feels she is functioning better with these medications than when these were discontinued in the past..   She takes  diazepam 5 mg bid (for tremors/shakiness).  She felt much worse when Valium was reduced in the past.       History of encephalitis     November 2017, she began developing headaches. She had a brain MRI w/o contrast done in November 2017 for this and this showed numerous supratentorial white matter abnormalities and an abnormality in the pons. Then in December, she developed back pain and used a heating pad on her back and realized that she could not feel that heat. Shortly thereafter, she developed left abdominal pain. However, she traveled to [facility] to visit her friend and during the trip she developed numbness in her legs that progressed over a week or two. By the time she returned home in early January 2018, she was using a wheel chair in the airport and having difficulty walking. She went to the ER twice and was sent home. Around that time, she had a lumbar MRI that showed a protruding disc and initially, it was felt that her symptoms were due to that. However, she lost control of her bowl/bladder and developed a left leg rash and this brought her back to the ER. This time, she was admitted at [facility] from Jan 18- 24. She had an LP that showed 490 nucleated cells, 34 RBCs, 96 glucose and 53 protein. HSV2 PCR from the CSF was positive. Serum NMO  negative, anca panel was negative. C/T spine MRI demonstrated longitudinally extensive T2 abnormalities and enhancement. Brain MRI had new lesions including frontal cortical ribboning on DWI with possible ADC correlate. She was diagnosed with HSV2 encephalomyelitis and treated with IV acyclovir and high dose steroids. She was discharged home with a pic line to continue acyclovir for 21 days and steroids were not continued. Within a couple of days following discharge, she began having numbness,and she reports involuntary movements of her arms. She was readmitted to the hospital on Jan 20 and IV acyclovir was continued and she was restarted on steroids. She did better and went  to inpatient rehab for 18 days and then was discharged home with home health PT/OT.   Due to fluctuations, she has been maintained on oral acyclovir.    IMAGING: MRI of the cervical spine 09/15/2021 shows spinal cord atrophy, T2 hyperintense signal in the posterior columns adjacent to C3-C4 and patchy signal below C6 posteriorly.  There is moderate spinal stenosis at C5-C6 and milder spinal stenosis at C4-C5 and C6-C7.  This study was done without contrast.  MRI of the brain 09/15/2021 shows confluent T2 hyperintense abnormal signal predominantly in the frontal greater than parietal lobes extending from the periventricular to the subcortical white matter.  there is also abnormal signal within the pons.  No change compared to the previous MRI from July 2022.  MRI of the cervical spine with and without contrast 02/01/2020 shows spinal cord encephalomalacia adjacent to C3 to C4 and increased signal in a patchy manner in the lower cervical spinal cord.  Moderate spinal stenosis at C5-C6.  Normal enhancement pattern.  MRI of the brain 03/14/2018 showed T2/FLAIR hyperintense foci in the hemispheres and in the pons with large confluencies in the frontal lobes.  The right frontal lobe changes have increased when compared to the 12/25/2016 MRI while the changes elsewhere are more stable.  These findings are consistent with chronic microvascular ischemic changes with superimposed more recent right frontal findings that could be due to the history of encephalomyelitis or chronic stroke.  Atrophy had increased compared to the 12/25/2016 MRI.  Normal enhancement pattern.  MRI of the cervical spine 03/14/2018 showed a focus at the cervicomedullary junction, a larger posterior focus adjacent to C3-C4 and central/posterior focus at C6.  Moderately severe spinal stenosis at C5-C6 and mild spinal stenosis at C4-C5 and C6-C7.  MRI of the cervical spine 12/25/2016 shows an enhancing focus at C3-C4 posteriorly with nonenhancing  foci at the cervicomedullary junction and at C6-C7.  Moderate spinal stenosis at C5-C6 and moderate spinal stenosis at C4-C5 and C6-C7.  MRI of the head 10/03/2016 showed patchy hyperintense foci in the hemispheres (significantly less than seen 2019 and later).  No enhancement.  Foci in the pons consistent with chronic microvascular ischemic changes.  Ribbon cortical DWI changes  MRI of the cervical and thoracic spine showed a longitudinal extensive transverse myelitis from C2 through C4C5 associated with enlargement of spinal cord and other foci adjacent to C6-C7 and T1.  In the thoracic spine, additionally, there is increased signal from T5-T7.  These enhance after contrast.  PERTINENT LABS: -- CSF Analysis:  09/2016: 490 nucleated cells, 34 RBCs, 96 glucose and 53 protein. HSV2 PCR from the CSF was positive.   -- Other Studies:  09/2016- NMO AB neg.,ANCA panel neg, ANA neg,ENA neg, ACE neg  REVIEW OF SYSTEMS: Constitutional: No fevers, chills, sweats, or change in appetite Eyes: No visual changes, double vision, eye pain Ear, nose  and throat: No hearing loss, ear pain, nasal congestion, sore throat Cardiovascular: No chest pain, palpitations Respiratory:  No shortness of breath at rest or with exertion.   No wheezes GastrointestinaI: No nausea, vomiting, diarrhea, abdominal pain, fecal incontinence Genitourinary:  .  Suprapubic catheter for urinary retention Musculoskeletal:  No neck pain, back pain Integumentary: No rash, pruritus, skin lesions Neurological: as above Psychiatric: No depression at this time.  No anxiety Endocrine: No palpitations, diaphoresis, change in appetite, change in weigh or increased thirst Hematologic/Lymphatic:  No anemia, purpura, petechiae. Allergic/Immunologic: No itchy/runny eyes, nasal congestion, recent allergic reactions, rashes  ALLERGIES: Allergies  Allergen Reactions   Demerol [Meperidine] Other (See Comments)    Hallucinations    Hydrocodone-Acetaminophen Other (See Comments)   Percocet [Oxycodone-Acetaminophen] Itching   Amoxicillin-Pot Clavulanate Diarrhea    Severe pain, headache, intestinal infection   Lamotrigine Rash   Penicillins Itching and Rash    Tolerated amoxicillin November 2022  Has patient had a PCN reaction causing immediate rash, facial/tongue/throat swelling, SOB or lightheadedness with hypotension:  NO Has patient had a PCN reaction causing severe rash involving mucus membranes or skin necrosis: No Has patient had a PCN reaction that required hospitalization: No Has patient had a PCN reaction occurring within the last 10 years: Yes If all of the above answers are "NO", then may proceed with Cephalosporin use.    HOME MEDICATIONS:  Current Outpatient Medications:    acyclovir (ZOVIRAX) 400 MG tablet, TAKE 1 TABLET BY MOUTH TWICE A DAY, Disp: 180 tablet, Rfl: 2   albuterol (PROVENTIL) (2.5 MG/3ML) 0.083% nebulizer solution, Take 3 mLs (2.5 mg total) by nebulization every 2 (two) hours as needed for wheezing or shortness of breath., Disp: 75 mL, Rfl: 12   apixaban (ELIQUIS) 5 MG TABS tablet, Take 1 tablet (5 mg total) by mouth 2 (two) times daily., Disp: 60 tablet, Rfl:    Ascorbic Acid (VITAMIN C) 500 MG CHEW, Chew 500 mg by mouth daily., Disp: , Rfl:    azithromycin (ZITHROMAX) 250 MG tablet, Take two tablets on day 1 followed by one tablet daily for days 2-5, Disp: 6 tablet, Rfl: 0   Calcium Carb-Cholecalciferol (CALCIUM 600 + D PO), Take 1 tablet by mouth in the morning and at bedtime., Disp: , Rfl:    Cyanocobalamin (VITAMIN B-12 PO), Take 500 mcg by mouth daily., Disp: , Rfl:    diazepam (VALIUM) 5 MG tablet, TAKE 1 TABLET (5 MG TOTAL) BY MOUTH EVERY 12 (TWELVE) HOURS AS NEEDED (FOR TREMORS)., Disp: 60 tablet, Rfl: 2   DULoxetine (CYMBALTA) 60 MG capsule, TAKE 1 CAPSULE (60 MG TOTAL) BY MOUTH AT BEDTIME., Disp: 90 capsule, Rfl: 1   feeding supplement (ENSURE ENLIVE / ENSURE PLUS) LIQD, Take 237  mLs by mouth 2 (two) times daily between meals., Disp: 237 mL, Rfl: 12   ferrous sulfate 325 (65 FE) MG tablet, Take 325 mg by mouth every morning., Disp: , Rfl:    fluticasone-salmeterol (ADVAIR) 100-50 MCG/ACT AEPB, Inhale 1 puff into the lungs 2 (two) times daily., Disp: 60 each, Rfl: 5   midodrine (PROAMATINE) 5 MG tablet, Take 1 tablet (5 mg total) by mouth 2 (two) times daily with a meal., Disp: 60 tablet, Rfl: 0   omega-3 acid ethyl esters (LOVAZA) 1 g capsule, Take 1 g by mouth every morning., Disp: , Rfl:    pantoprazole (PROTONIX) 40 MG tablet, Take 1 tablet (40 mg total) by mouth daily., Disp: 30 tablet, Rfl: 1   predniSONE (DELTASONE)  5 MG tablet, TAKE 1 TABLET BY MOUTH EVERY DAY WITH BREAKFAST, Disp: 90 tablet, Rfl: 1   pregabalin (LYRICA) 50 MG capsule, TAKE 1 CAPSULE BY MOUTH TWICE A DAY, Disp: 180 capsule, Rfl: 1   traZODone (DESYREL) 50 MG tablet, Take 1 tablet (50 mg total) by mouth at bedtime., Disp: 90 tablet, Rfl: 3  PAST MEDICAL HISTORY: Past Medical History:  Diagnosis Date   Acute deep vein thrombosis (DVT) of popliteal vein of left lower extremity (HCC)    Anxiety    Back pain    Chronic kidney disease    Encephalomyelitis    Gait abnormality 11/14/2016   Memory difficulty 03/02/2019   Myelitis due to herpes simplex (HCC)    Neurogenic bladder     PAST SURGICAL HISTORY: Past Surgical History:  Procedure Laterality Date   ABDOMINAL HYSTERECTOMY     BLADDER REPAIR     CESAREAN SECTION     IR ANGIO EXTERNAL CAROTID SEL EXT CAROTID BILAT MOD SED  07/18/2021   IR ANGIO INTRA EXTRACRAN SEL INTERNAL CAROTID BILAT MOD SED  07/18/2021   IR KYPHO LUMBAR INC FX REDUCE BONE BX UNI/BIL CANNULATION INC/IMAGING  04/11/2018   IR NEURO EACH ADD'L AFTER BASIC UNI LEFT (MS)  07/18/2021   IR NEURO EACH ADD'L AFTER BASIC UNI RIGHT (MS)  07/18/2021   IR TRANSCATH/EMBOLIZ  07/18/2021   IR US GUIDE VASC ACCESS RIGHT  07/18/2021   RADIOLOGY WITH ANESTHESIA N/A 07/18/2021    Procedure: IR WITH ANESTHESIA;  Surgeon: Baldemar Lenis, MD;  Location: [facility] OR;  Service: Radiology;  Laterality: N/A;   TUBAL LIGATION      FAMILY HISTORY: Family History  Problem Relation Age of Onset   Hypertension Mother     SOCIAL HISTORY:  Social History   Socioeconomic History   Marital status: Married    Spouse name: Brett Canales   Number of children: 2   Years of education: 12   Highest education level: Not on file  Occupational History   Occupation: retired  Tobacco Use   Smoking status: Former    Current packs/day: 0.00    Average packs/day: 2.0 packs/day for 10.0 years (20.0 ttl pk-yrs)    Types: Cigarettes    Start date: 09/03/1969    Quit date: 09/04/1979    Years since quitting: 44.2   Smokeless tobacco: Never   Tobacco comments:    40 years ago   Vaping Use   Vaping status: Never Used  Substance and Sexual Activity   Alcohol use: No   Drug use: Never   Sexual activity: Not Currently  Other Topics Concern   Not on file  Social History Narrative   Lives with husband   Caffeine 2-3 cups daily   Right-handed   Social Drivers of Health   Financial Resource Strain: Not on file  Food Insecurity: No Food Insecurity (06/28/2022)   Hunger Vital Sign    Worried About Running Out of Food in the Last Year: Never true    Ran Out of Food in the Last Year: Never true  Transportation Needs: No Transportation Needs (06/28/2022)   PRAPARE - Administrator, Civil Service (Medical): No    Lack of Transportation (Non-Medical): No  Physical Activity: Not on file  Stress: Not on file  Social Connections: Not on file  Intimate Partner Violence: Not At Risk (06/28/2022)   Humiliation, Afraid, Rape, and Kick questionnaire    Fear of Current or Ex-Partner: No  Emotionally Abused: No    Physically Abused: No    Sexually Abused: No     PHYSICAL EXAM  She is a bedbound well-developed well-nourished woman in no acute distress.  The head is  normocephalic and atraumatic.  Sclera are anicteric.  Visible skin appears normal.  The neck has a good range of motion.  Facial strength seems symmetric.    DIAGNOSTIC DATA (LABS, IMAGING, TESTING) - I reviewed patient records, labs, notes, testing and imaging myself where available.  Lab Results  Component Value Date   WBC 11.3 (H) 07/03/2022   HGB 9.1 (L) 07/03/2022   HCT 29.5 (L) 07/03/2022   MCV 100.7 (H) 07/03/2022   PLT 197 07/03/2022      Component Value Date/Time   NA 140 07/02/2022 0449   NA 137 03/02/2019 1612   K 4.2 07/02/2022 0449   CL 105 07/02/2022 0449   CO2 30 07/02/2022 0449   GLUCOSE 85 07/02/2022 0449   BUN 8 07/02/2022 0449   BUN 8 03/02/2019 1612   CREATININE 0.64 07/02/2022 0449   CREATININE 0.80 01/08/2017 1449   CALCIUM 8.2 (L) 07/02/2022 0449   PROT 6.9 04/15/2023 1542   ALBUMIN 2.7 (L) 07/02/2022 0449   ALBUMIN 4.2 03/02/2019 1612   AST 41 06/28/2022 0351   ALT 35 06/28/2022 0351   ALKPHOS 58 06/28/2022 0351   BILITOT 0.5 06/28/2022 0351   BILITOT 0.2 03/02/2019 1612   GFRNONAA >60 07/02/2022 0449   GFRAA >60 02/17/2020 0453   Lab Results  Component Value Date   CHOL 150 09/16/2021   HDL 48 09/16/2021   LDLCALC 76 09/16/2021   TRIG 130 09/16/2021   CHOLHDL 3.1 09/16/2021   Lab Results  Component Value Date   HGBA1C 5.2 06/27/2022   Lab Results  Component Value Date   VITAMINB12 >2000 (H) 04/15/2023   Lab Results  Component Value Date   TSH 1.817 03/15/2021       ASSESSMENT AND PLAN  Incomplete paraplegia (HCC)  Encephalomyelitis  Gait abnormality  Neurogenic bladder  Neuropathic pain   Continue Lyrica 50 mg po bid (afternoon and night).   Continue acyclovir and 5 mg daily prednisone..   Continue diazepam for spasticity.  Trazodone at bedtime Suprapubic catheter managed by urology. Return in 6 months or sooner if there are new or worsening neurologic symptoms.    Follow Up Instructions: I discussed the  assessment and treatment plan with the patient. The patient was provided an opportunity to ask questions and all were answered. The patient agreed with the plan and demonstrated an understanding of the instructions.    The patient was advised to call back or seek an in-person evaluation if the symptoms worsen or if the condition fails to improve as anticipated.  18-minute office visit with the majority of the time spent f on video for history and physical, discussion/counseling and decision-making.  Additional time with extensive record review including multiple MRIs personally reviewed and documentation.  This visit is part of a comprehensive longitudinal care medical relationship regarding the patients primary diagnosis of HSV encephalomyelitis and sequelae.  Marge Vandermeulen A. Epimenio Foot, MD, [facility] 11/05/2023, 3:27 PM Certified in Neurology, Clinical Neurophysiology, Sleep Medicine and Neuroimaging  [facility] [address], [address] [place], [place] [address] [phone]

## 2023-11-05 NOTE — Telephone Encounter (Signed)
 Patient's husband, Brett Canales Lamaster asking during MyChart visiit if Dr. Epimenio Foot could ask some questions see if can get a feel for what's going on with her.The reason she doesn't recognize family member, she thinks her high school boyfriend walking by her house and we live in [place] (we live in [place]), she does not recognize the home we have been living in for 40 years., she thinks I'm stealing her medis.

## 2023-11-05 NOTE — Telephone Encounter (Signed)
 Pt has appt today at 3pm with Dr. Epimenio Foot

## 2023-12-04 ENCOUNTER — Telehealth: Payer: Self-pay | Admitting: Neurology

## 2023-12-04 MED ORDER — TRAZODONE HCL 50 MG PO TABS: 50.0000 mg | ORAL_TABLET | Freq: Every day | ORAL | 4 refills | Status: DC

## 2023-12-04 NOTE — Telephone Encounter (Addendum)
 Last seen on 11/05/23 per note " Continue diazepam for spasticity.  Trazodone at bedtime" Follow up scheduled on 05/07/24  Rx sent   [facility] called [facility] it appears 90 day supply with 3 refills was sent to pharmacy in 9/24. [facility] said that Rx was deleted and they will need a new Rx.

## 2023-12-04 NOTE — Telephone Encounter (Signed)
 Pt called needing a refill on her traZODone (DESYREL) 50 MG tablet and needing it sent to the [facility] on [address]

## 2023-12-09 ENCOUNTER — Other Ambulatory Visit: Payer: Self-pay | Admitting: Neurology

## 2023-12-09 NOTE — Telephone Encounter (Signed)
 Last seen on 11/05/23 Follow up scheduled on 05/07/24

## 2023-12-25 ENCOUNTER — Other Ambulatory Visit: Payer: Self-pay | Admitting: Neurology

## 2023-12-25 DIAGNOSIS — M792 Neuralgia and neuritis, unspecified: Secondary | ICD-10-CM

## 2023-12-25 DIAGNOSIS — G0490 Encephalitis and encephalomyelitis, unspecified: Secondary | ICD-10-CM

## 2023-12-25 DIAGNOSIS — R269 Unspecified abnormalities of gait and mobility: Secondary | ICD-10-CM

## 2023-12-25 NOTE — Telephone Encounter (Signed)
 Last seen on 11/05/22 Follow up scheduled on 05/07/24

## 2023-12-28 ENCOUNTER — Other Ambulatory Visit: Payer: Self-pay | Admitting: Neurology

## 2023-12-30 NOTE — Telephone Encounter (Signed)
 Pharmacy requesting 90 day supply  Follow up scheduled on 05/07/24

## 2024-01-13 ENCOUNTER — Other Ambulatory Visit: Payer: Self-pay

## 2024-01-13 ENCOUNTER — Inpatient Hospital Stay (HOSPITAL_COMMUNITY)
Admission: EM | Admit: 2024-01-13 | Discharge: 2024-01-16 | DRG: 871 | Disposition: A | Discharge status: Hospice | Attending: Critical Care Medicine | Admitting: Critical Care Medicine

## 2024-01-13 ENCOUNTER — Encounter (HOSPITAL_COMMUNITY): Payer: Self-pay

## 2024-01-13 DIAGNOSIS — Z86711 Personal history of pulmonary embolism: Secondary | ICD-10-CM

## 2024-01-13 DIAGNOSIS — Z880 Allergy status to penicillin: Secondary | ICD-10-CM

## 2024-01-13 DIAGNOSIS — M545 Low back pain, unspecified: Secondary | ICD-10-CM | POA: Diagnosis present

## 2024-01-13 DIAGNOSIS — R918 Other nonspecific abnormal finding of lung field: Secondary | ICD-10-CM | POA: Diagnosis not present

## 2024-01-13 DIAGNOSIS — R197 Diarrhea, unspecified: Secondary | ICD-10-CM | POA: Diagnosis not present

## 2024-01-13 DIAGNOSIS — G40909 Epilepsy, unspecified, not intractable, without status epilepticus: Secondary | ICD-10-CM | POA: Diagnosis present

## 2024-01-13 DIAGNOSIS — Z7901 Long term (current) use of anticoagulants: Secondary | ICD-10-CM

## 2024-01-13 DIAGNOSIS — Z9359 Other cystostomy status: Secondary | ICD-10-CM

## 2024-01-13 DIAGNOSIS — J9601 Acute respiratory failure with hypoxia: Secondary | ICD-10-CM | POA: Diagnosis present

## 2024-01-13 DIAGNOSIS — N182 Chronic kidney disease, stage 2 (mild): Secondary | ICD-10-CM | POA: Diagnosis not present

## 2024-01-13 DIAGNOSIS — R0602 Shortness of breath: Secondary | ICD-10-CM | POA: Diagnosis not present

## 2024-01-13 DIAGNOSIS — A419 Sepsis, unspecified organism: Secondary | ICD-10-CM | POA: Diagnosis not present

## 2024-01-13 DIAGNOSIS — Z90710 Acquired absence of both cervix and uterus: Secondary | ICD-10-CM

## 2024-01-13 DIAGNOSIS — G928 Other toxic encephalopathy: Secondary | ICD-10-CM | POA: Diagnosis present

## 2024-01-13 DIAGNOSIS — Z8249 Family history of ischemic heart disease and other diseases of the circulatory system: Secondary | ICD-10-CM

## 2024-01-13 DIAGNOSIS — Z888 Allergy status to other drugs, medicaments and biological substances status: Secondary | ICD-10-CM

## 2024-01-13 DIAGNOSIS — R231 Pallor: Secondary | ICD-10-CM | POA: Diagnosis not present

## 2024-01-13 DIAGNOSIS — Z8661 Personal history of infections of the central nervous system: Secondary | ICD-10-CM

## 2024-01-13 DIAGNOSIS — R6521 Severe sepsis with septic shock: Secondary | ICD-10-CM | POA: Diagnosis not present

## 2024-01-13 DIAGNOSIS — N3289 Other specified disorders of bladder: Secondary | ICD-10-CM | POA: Diagnosis present

## 2024-01-13 DIAGNOSIS — Z87891 Personal history of nicotine dependence: Secondary | ICD-10-CM

## 2024-01-13 DIAGNOSIS — N189 Chronic kidney disease, unspecified: Secondary | ICD-10-CM | POA: Diagnosis not present

## 2024-01-13 DIAGNOSIS — J189 Pneumonia, unspecified organism: Secondary | ICD-10-CM | POA: Diagnosis present

## 2024-01-13 DIAGNOSIS — R4182 Altered mental status, unspecified: Secondary | ICD-10-CM | POA: Diagnosis not present

## 2024-01-13 DIAGNOSIS — Z7401 Bed confinement status: Secondary | ICD-10-CM

## 2024-01-13 DIAGNOSIS — Z7951 Long term (current) use of inhaled steroids: Secondary | ICD-10-CM

## 2024-01-13 DIAGNOSIS — G8222 Paraplegia, incomplete: Secondary | ICD-10-CM | POA: Diagnosis present

## 2024-01-13 DIAGNOSIS — Z8619 Personal history of other infectious and parasitic diseases: Secondary | ICD-10-CM

## 2024-01-13 DIAGNOSIS — N319 Neuromuscular dysfunction of bladder, unspecified: Secondary | ICD-10-CM | POA: Diagnosis present

## 2024-01-13 DIAGNOSIS — I2510 Atherosclerotic heart disease of native coronary artery without angina pectoris: Secondary | ICD-10-CM | POA: Diagnosis not present

## 2024-01-13 DIAGNOSIS — T50905A Adverse effect of unspecified drugs, medicaments and biological substances, initial encounter: Secondary | ICD-10-CM | POA: Diagnosis present

## 2024-01-13 DIAGNOSIS — Z66 Do not resuscitate: Secondary | ICD-10-CM | POA: Diagnosis present

## 2024-01-13 DIAGNOSIS — K529 Noninfective gastroenteritis and colitis, unspecified: Secondary | ICD-10-CM | POA: Diagnosis present

## 2024-01-13 DIAGNOSIS — J690 Pneumonitis due to inhalation of food and vomit: Secondary | ICD-10-CM | POA: Diagnosis not present

## 2024-01-13 DIAGNOSIS — J40 Bronchitis, not specified as acute or chronic: Secondary | ICD-10-CM | POA: Diagnosis not present

## 2024-01-13 DIAGNOSIS — J984 Other disorders of lung: Secondary | ICD-10-CM | POA: Diagnosis not present

## 2024-01-13 DIAGNOSIS — G894 Chronic pain syndrome: Secondary | ICD-10-CM | POA: Diagnosis present

## 2024-01-13 DIAGNOSIS — N390 Urinary tract infection, site not specified: Secondary | ICD-10-CM | POA: Diagnosis not present

## 2024-01-13 DIAGNOSIS — R0902 Hypoxemia: Secondary | ICD-10-CM | POA: Diagnosis not present

## 2024-01-13 DIAGNOSIS — E872 Acidosis, unspecified: Secondary | ICD-10-CM | POA: Diagnosis present

## 2024-01-13 DIAGNOSIS — Z515 Encounter for palliative care: Secondary | ICD-10-CM

## 2024-01-13 DIAGNOSIS — Z79899 Other long term (current) drug therapy: Secondary | ICD-10-CM

## 2024-01-13 DIAGNOSIS — Z7952 Long term (current) use of systemic steroids: Secondary | ICD-10-CM

## 2024-01-13 DIAGNOSIS — R0689 Other abnormalities of breathing: Secondary | ICD-10-CM | POA: Diagnosis not present

## 2024-01-13 DIAGNOSIS — Z86718 Personal history of other venous thrombosis and embolism: Secondary | ICD-10-CM

## 2024-01-13 DIAGNOSIS — G9341 Metabolic encephalopathy: Secondary | ICD-10-CM | POA: Diagnosis present

## 2024-01-13 DIAGNOSIS — Z1152 Encounter for screening for COVID-19: Secondary | ICD-10-CM

## 2024-01-13 DIAGNOSIS — R000 Tachycardia, unspecified: Secondary | ICD-10-CM | POA: Diagnosis not present

## 2024-01-13 DIAGNOSIS — R404 Transient alteration of awareness: Secondary | ICD-10-CM | POA: Diagnosis not present

## 2024-01-13 DIAGNOSIS — Z7189 Other specified counseling: Secondary | ICD-10-CM

## 2024-01-13 DIAGNOSIS — I6523 Occlusion and stenosis of bilateral carotid arteries: Secondary | ICD-10-CM | POA: Diagnosis not present

## 2024-01-13 DIAGNOSIS — R339 Retention of urine, unspecified: Secondary | ICD-10-CM | POA: Diagnosis present

## 2024-01-13 DIAGNOSIS — Z885 Allergy status to narcotic agent status: Secondary | ICD-10-CM

## 2024-01-13 DIAGNOSIS — B004 Herpesviral encephalitis: Secondary | ICD-10-CM | POA: Diagnosis present

## 2024-01-13 DIAGNOSIS — F419 Anxiety disorder, unspecified: Secondary | ICD-10-CM | POA: Diagnosis present

## 2024-01-13 MED ORDER — SODIUM CHLORIDE 0.9 % IV BOLUS: 500.0000 mL | Freq: Once | INTRAVENOUS | Status: DC

## 2024-01-13 MED ORDER — ACETAMINOPHEN 650 MG RE SUPP
650.0000 mg | Freq: Once | RECTAL | Status: AC
  Administered 2024-01-14: 650 mg via RECTAL
  Filled 2024-01-13: qty 1

## 2024-01-13 NOTE — ED Triage Notes (Signed)
 Pt came in from home via [facility] for AMS and extremities cold and clammy, after started on liquid opium ; hospice patient Sat 75% RA, CBG 141; HR 133, Sinus tach; BP 92 palpated Foley dark urine, Alert to self. 500 cc LR given

## 2024-01-14 ENCOUNTER — Emergency Department (HOSPITAL_COMMUNITY)

## 2024-01-14 ENCOUNTER — Other Ambulatory Visit (HOSPITAL_COMMUNITY)

## 2024-01-14 ENCOUNTER — Encounter (HOSPITAL_COMMUNITY): Payer: Self-pay

## 2024-01-14 ENCOUNTER — Other Ambulatory Visit: Payer: Self-pay

## 2024-01-14 DIAGNOSIS — N189 Chronic kidney disease, unspecified: Secondary | ICD-10-CM

## 2024-01-14 DIAGNOSIS — Z1152 Encounter for screening for COVID-19: Secondary | ICD-10-CM | POA: Diagnosis not present

## 2024-01-14 DIAGNOSIS — J690 Pneumonitis due to inhalation of food and vomit: Secondary | ICD-10-CM

## 2024-01-14 DIAGNOSIS — K529 Noninfective gastroenteritis and colitis, unspecified: Secondary | ICD-10-CM | POA: Diagnosis present

## 2024-01-14 DIAGNOSIS — G9341 Metabolic encephalopathy: Secondary | ICD-10-CM | POA: Diagnosis present

## 2024-01-14 DIAGNOSIS — I2510 Atherosclerotic heart disease of native coronary artery without angina pectoris: Secondary | ICD-10-CM | POA: Diagnosis not present

## 2024-01-14 DIAGNOSIS — N3289 Other specified disorders of bladder: Secondary | ICD-10-CM | POA: Diagnosis present

## 2024-01-14 DIAGNOSIS — Z515 Encounter for palliative care: Secondary | ICD-10-CM | POA: Diagnosis not present

## 2024-01-14 DIAGNOSIS — R4182 Altered mental status, unspecified: Secondary | ICD-10-CM | POA: Diagnosis not present

## 2024-01-14 DIAGNOSIS — G928 Other toxic encephalopathy: Secondary | ICD-10-CM | POA: Diagnosis present

## 2024-01-14 DIAGNOSIS — Z9359 Other cystostomy status: Secondary | ICD-10-CM | POA: Diagnosis not present

## 2024-01-14 DIAGNOSIS — J40 Bronchitis, not specified as acute or chronic: Secondary | ICD-10-CM | POA: Diagnosis not present

## 2024-01-14 DIAGNOSIS — J9601 Acute respiratory failure with hypoxia: Secondary | ICD-10-CM | POA: Diagnosis present

## 2024-01-14 DIAGNOSIS — I6523 Occlusion and stenosis of bilateral carotid arteries: Secondary | ICD-10-CM | POA: Diagnosis not present

## 2024-01-14 DIAGNOSIS — J984 Other disorders of lung: Secondary | ICD-10-CM | POA: Diagnosis not present

## 2024-01-14 DIAGNOSIS — N182 Chronic kidney disease, stage 2 (mild): Secondary | ICD-10-CM | POA: Diagnosis present

## 2024-01-14 DIAGNOSIS — F419 Anxiety disorder, unspecified: Secondary | ICD-10-CM | POA: Diagnosis present

## 2024-01-14 DIAGNOSIS — R0602 Shortness of breath: Secondary | ICD-10-CM | POA: Diagnosis not present

## 2024-01-14 DIAGNOSIS — E872 Acidosis, unspecified: Secondary | ICD-10-CM | POA: Diagnosis present

## 2024-01-14 DIAGNOSIS — G40909 Epilepsy, unspecified, not intractable, without status epilepticus: Secondary | ICD-10-CM | POA: Diagnosis present

## 2024-01-14 DIAGNOSIS — G8222 Paraplegia, incomplete: Secondary | ICD-10-CM | POA: Diagnosis present

## 2024-01-14 DIAGNOSIS — G894 Chronic pain syndrome: Secondary | ICD-10-CM | POA: Diagnosis present

## 2024-01-14 DIAGNOSIS — R918 Other nonspecific abnormal finding of lung field: Secondary | ICD-10-CM | POA: Diagnosis not present

## 2024-01-14 DIAGNOSIS — M545 Low back pain, unspecified: Secondary | ICD-10-CM | POA: Diagnosis present

## 2024-01-14 DIAGNOSIS — R6521 Severe sepsis with septic shock: Secondary | ICD-10-CM

## 2024-01-14 DIAGNOSIS — R197 Diarrhea, unspecified: Secondary | ICD-10-CM

## 2024-01-14 DIAGNOSIS — B004 Herpesviral encephalitis: Secondary | ICD-10-CM | POA: Diagnosis present

## 2024-01-14 DIAGNOSIS — A419 Sepsis, unspecified organism: Secondary | ICD-10-CM | POA: Diagnosis present

## 2024-01-14 DIAGNOSIS — T50905A Adverse effect of unspecified drugs, medicaments and biological substances, initial encounter: Secondary | ICD-10-CM | POA: Diagnosis present

## 2024-01-14 DIAGNOSIS — Z7189 Other specified counseling: Secondary | ICD-10-CM

## 2024-01-14 DIAGNOSIS — Z66 Do not resuscitate: Secondary | ICD-10-CM | POA: Diagnosis present

## 2024-01-14 DIAGNOSIS — J189 Pneumonia, unspecified organism: Secondary | ICD-10-CM | POA: Diagnosis present

## 2024-01-14 DIAGNOSIS — R339 Retention of urine, unspecified: Secondary | ICD-10-CM | POA: Diagnosis present

## 2024-01-14 DIAGNOSIS — N319 Neuromuscular dysfunction of bladder, unspecified: Secondary | ICD-10-CM | POA: Diagnosis present

## 2024-01-14 LAB — BASIC METABOLIC PANEL WITH GFR
Anion gap: 12 (ref 5–15)
BUN: 20 mg/dL (ref 8–23)
CO2: 30 mmol/L (ref 22–32)
Calcium: 10 mg/dL (ref 8.9–10.3)
Chloride: 97 mmol/L — ABNORMAL LOW (ref 98–111)
Creatinine, Ser: 0.84 mg/dL (ref 0.44–1.00)
GFR, Estimated: >60 mL/min (ref >60)
Glucose, Bld: 144 mg/dL — ABNORMAL HIGH (ref 70–99)
Potassium: 4.1 mmol/L (ref 3.5–5.1)
Sodium: 139 mmol/L (ref 135–145)

## 2024-01-14 LAB — CBC
HCT: 58.5 % — ABNORMAL HIGH (ref 36.0–46.0)
Hemoglobin: 18.2 g/dL — ABNORMAL HIGH (ref 12.0–15.0)
MCH: 32.5 pg (ref 26.0–34.0)
MCHC: 31.1 g/dL (ref 30.0–36.0)
MCV: 104.5 fL — ABNORMAL HIGH (ref 80.0–100.0)
Platelets: 208 10*3/uL (ref 150–400)
RBC: 5.6 MIL/uL — ABNORMAL HIGH (ref 3.87–5.11)
RDW: 14.4 % (ref 11.5–15.5)
WBC: 20.2 10*3/uL — ABNORMAL HIGH (ref 4.0–10.5)
nRBC: 0 % (ref 0.0–0.2)

## 2024-01-14 LAB — RESP PANEL BY RT-PCR (RSV, FLU A&B, COVID)  RVPGX2
Influenza A by PCR: NEGATIVE
Influenza B by PCR: NEGATIVE
Resp Syncytial Virus by PCR: NEGATIVE
SARS Coronavirus 2 by RT PCR: NEGATIVE

## 2024-01-14 LAB — COMPREHENSIVE METABOLIC PANEL WITH GFR
ALT: 45 U/L — ABNORMAL HIGH (ref 0–44)
AST: 57 U/L — ABNORMAL HIGH (ref 15–41)
Albumin: 2.7 g/dL — ABNORMAL LOW (ref 3.5–5.0)
Alkaline Phosphatase: 68 U/L (ref 38–126)
Anion gap: 8 (ref 5–15)
BUN: 17 mg/dL (ref 8–23)
CO2: 26 mmol/L (ref 22–32)
Calcium: 7.5 mg/dL — ABNORMAL LOW (ref 8.9–10.3)
Chloride: 104 mmol/L (ref 98–111)
Creatinine, Ser: 0.74 mg/dL (ref 0.44–1.00)
GFR, Estimated: >60 mL/min (ref >60)
Glucose, Bld: 150 mg/dL — ABNORMAL HIGH (ref 70–99)
Potassium: 3.3 mmol/L — ABNORMAL LOW (ref 3.5–5.1)
Sodium: 138 mmol/L (ref 135–145)
Total Bilirubin: 1 mg/dL (ref 0.0–1.2)
Total Protein: 5.4 g/dL — ABNORMAL LOW (ref 6.5–8.1)

## 2024-01-14 LAB — BLOOD GAS, VENOUS
Acid-Base Excess: 3.2 mmol/L — ABNORMAL HIGH (ref 0.0–2.0)
Bicarbonate: 30.9 mmol/L — ABNORMAL HIGH (ref 20.0–28.0)
O2 Saturation: 88.9 %
Patient temperature: 37
pCO2, Ven: 60 mmHg (ref 44–60)
pH, Ven: 7.32 (ref 7.25–7.43)
pO2, Ven: 55 mmHg — ABNORMAL HIGH (ref 32–45)

## 2024-01-14 LAB — PROTIME-INR
INR: 1.1 (ref 0.8–1.2)
Prothrombin Time: 14.5 s (ref 11.4–15.2)

## 2024-01-14 LAB — URINALYSIS, ROUTINE W REFLEX MICROSCOPIC
Bilirubin Urine: NEGATIVE
Glucose, UA: NEGATIVE mg/dL
Ketones, ur: NEGATIVE mg/dL
Nitrite: POSITIVE — AB
Protein, ur: >=300 mg/dL — AB
RBC / HPF: >50 RBC/hpf (ref 0–5)
Specific Gravity, Urine: 1.019 (ref 1.005–1.030)
WBC, UA: >50 WBC/hpf (ref 0–5)
pH: 7 (ref 5.0–8.0)

## 2024-01-14 LAB — LACTIC ACID, PLASMA
Lactic Acid, Venous: 2.2 mmol/L (ref 0.5–1.9)
Lactic Acid, Venous: 2.3 mmol/L (ref 0.5–1.9)

## 2024-01-14 LAB — MRSA NEXT GEN BY PCR, NASAL: MRSA by PCR Next Gen: NOT DETECTED

## 2024-01-14 LAB — GLUCOSE, CAPILLARY: Glucose-Capillary: 194 mg/dL — ABNORMAL HIGH (ref 70–99)

## 2024-01-14 LAB — I-STAT CG4 LACTIC ACID, ED
Lactic Acid, Venous: 3.6 mmol/L (ref 0.5–1.9)
Lactic Acid, Venous: 3.2 mmol/L (ref 0.5–1.9)

## 2024-01-14 LAB — AMMONIA: Ammonia: 20 umol/L (ref 9–35)

## 2024-01-14 LAB — BRAIN NATRIURETIC PEPTIDE: B Natriuretic Peptide: 46.7 pg/mL (ref 0.0–100.0)

## 2024-01-14 MED ORDER — LACTATED RINGERS IV SOLN: INTRAVENOUS | Status: DC

## 2024-01-14 MED ORDER — VITAMIN B-12 1000 MCG PO TABS
1000.0000 ug | ORAL_TABLET | Freq: Every day | ORAL | Status: DC
  Administered 2024-01-15 – 2024-01-16 (×2): 1000 ug via ORAL
  Filled 2024-01-14 (×2): qty 1

## 2024-01-14 MED ORDER — DIAZEPAM 5 MG PO TABS: 5.0000 mg | ORAL_TABLET | Freq: Two times a day (BID) | ORAL | Status: DC | PRN

## 2024-01-14 MED ORDER — MIRTAZAPINE 15 MG PO TABS
30.0000 mg | ORAL_TABLET | Freq: Every day | ORAL | Status: DC
  Administered 2024-01-15: 30 mg via ORAL
  Filled 2024-01-14: qty 2

## 2024-01-14 MED ORDER — CHOLESTYRAMINE LIGHT 4 G PO PACK
4.0000 g | PACK | Freq: Two times a day (BID) | ORAL | Status: DC
  Administered 2024-01-15 (×2): 4 g via ORAL
  Filled 2024-01-14 (×4): qty 1

## 2024-01-14 MED ORDER — CARMEX CLASSIC LIP BALM EX OINT
TOPICAL_OINTMENT | Freq: Once | CUTANEOUS | Status: AC
  Filled 2024-01-14: qty 10

## 2024-01-14 MED ORDER — FERROUS SULFATE 325 (65 FE) MG PO TABS
325.0000 mg | ORAL_TABLET | Freq: Every morning | ORAL | Status: DC
Start: 2024-01-15 — End: 2024-01-17
  Administered 2024-01-15 – 2024-01-16 (×2): 325 mg via ORAL
  Filled 2024-01-14 (×2): qty 1

## 2024-01-14 MED ORDER — POTASSIUM CHLORIDE CRYS ER 20 MEQ PO TBCR
40.0000 meq | EXTENDED_RELEASE_TABLET | Freq: Once | ORAL | Status: AC
  Administered 2024-01-14: 40 meq via ORAL
  Filled 2024-01-14: qty 2

## 2024-01-14 MED ORDER — DULOXETINE HCL 30 MG PO CPEP
60.0000 mg | ORAL_CAPSULE | Freq: Every day | ORAL | Status: DC
  Administered 2024-01-15: 60 mg via ORAL
  Filled 2024-01-14: qty 2

## 2024-01-14 MED ORDER — TEMAZEPAM 15 MG PO CAPS: 15.0000 mg | ORAL_CAPSULE | Freq: Every evening | ORAL | Status: DC | PRN

## 2024-01-14 MED ORDER — LACTATED RINGERS IV BOLUS
500.0000 mL | Freq: Once | INTRAVENOUS | Status: AC
  Administered 2024-01-14: 500 mL via INTRAVENOUS

## 2024-01-14 MED ORDER — BACLOFEN 10 MG PO TABS
5.0000 mg | ORAL_TABLET | Freq: Three times a day (TID) | ORAL | Status: DC
  Administered 2024-01-15 – 2024-01-16 (×5): 5 mg via ORAL
  Filled 2024-01-14 (×5): qty 1

## 2024-01-14 MED ORDER — ACYCLOVIR 400 MG PO TABS
400.0000 mg | ORAL_TABLET | Freq: Two times a day (BID) | ORAL | Status: DC
  Administered 2024-01-15 – 2024-01-16 (×3): 400 mg via ORAL
  Filled 2024-01-14 (×5): qty 1

## 2024-01-14 MED ORDER — PREGABALIN 25 MG PO CAPS
50.0000 mg | ORAL_CAPSULE | Freq: Two times a day (BID) | ORAL | Status: DC
  Administered 2024-01-15 – 2024-01-16 (×3): 50 mg via ORAL
  Filled 2024-01-14 (×3): qty 2

## 2024-01-14 MED ORDER — ORAL CARE MOUTH RINSE: 15.0000 mL | OROMUCOSAL | Status: DC | PRN

## 2024-01-14 MED ORDER — METRONIDAZOLE 500 MG/100ML IV SOLN
500.0000 mg | Freq: Once | INTRAVENOUS | Status: AC
  Administered 2024-01-14: 500 mg via INTRAVENOUS
  Filled 2024-01-14: qty 100

## 2024-01-14 MED ORDER — METRONIDAZOLE 500 MG/100ML IV SOLN
500.0000 mg | Freq: Two times a day (BID) | INTRAVENOUS | Status: DC
Start: 2024-01-14 — End: 2024-01-14
  Administered 2024-01-14: 500 mg via INTRAVENOUS

## 2024-01-14 MED ORDER — SODIUM CHLORIDE 0.9 % IV SOLN
2.0000 g | Freq: Once | INTRAVENOUS | Status: AC
  Administered 2024-01-14: 2 g via INTRAVENOUS
  Filled 2024-01-14: qty 20

## 2024-01-14 MED ORDER — SODIUM CHLORIDE 0.9 % IV SOLN
250.0000 mL | INTRAVENOUS | Status: AC
  Administered 2024-01-14: 250 mL via INTRAVENOUS

## 2024-01-14 MED ORDER — ASPIRIN 325 MG PO TBEC
325.0000 mg | DELAYED_RELEASE_TABLET | Freq: Every day | ORAL | Status: DC
  Administered 2024-01-15 – 2024-01-16 (×2): 325 mg via ORAL
  Filled 2024-01-14 (×2): qty 1

## 2024-01-14 MED ORDER — DOCUSATE SODIUM 100 MG PO CAPS: 100.0000 mg | ORAL_CAPSULE | Freq: Two times a day (BID) | ORAL | Status: DC | PRN

## 2024-01-14 MED ORDER — OMEGA-3-ACID ETHYL ESTERS 1 G PO CAPS: 1.0000 g | ORAL_CAPSULE | Freq: Every morning | ORAL | Status: DC

## 2024-01-14 MED ORDER — HYDROCORTISONE NICU INJ SYRINGE 50 MG/ML: 100.0000 mg | Freq: Three times a day (TID) | INTRAVENOUS | Status: DC

## 2024-01-14 MED ORDER — POLYETHYLENE GLYCOL 3350 17 G PO PACK: 17.0000 g | PACK | Freq: Every day | ORAL | Status: DC | PRN

## 2024-01-14 MED ORDER — SODIUM CHLORIDE 0.9 % IV BOLUS
500.0000 mL | Freq: Once | INTRAVENOUS | Status: AC
  Administered 2024-01-14: 500 mL via INTRAVENOUS

## 2024-01-14 MED ORDER — APIXABAN 5 MG PO TABS: 5.0000 mg | ORAL_TABLET | Freq: Two times a day (BID) | ORAL | Status: DC

## 2024-01-14 MED ORDER — NOREPINEPHRINE 4 MG/250ML-% IV SOLN
2.0000 ug/min | INTRAVENOUS | Status: DC
  Administered 2024-01-14: 2 ug/min via INTRAVENOUS
  Administered 2024-01-14: 3 ug/min via INTRAVENOUS
  Filled 2024-01-14 (×2): qty 250

## 2024-01-14 MED ORDER — HYDROCORTISONE SOD SUC (PF) 100 MG IJ SOLR
100.0000 mg | INTRAMUSCULAR | Status: AC
  Administered 2024-01-14: 100 mg via INTRAVENOUS
  Filled 2024-01-14: qty 2

## 2024-01-14 MED ORDER — NALOXONE HCL 0.4 MG/ML IJ SOLN
0.1000 mg | Freq: Once | INTRAMUSCULAR | Status: AC
  Administered 2024-01-14: 0.1 mg via INTRAVENOUS
  Filled 2024-01-14: qty 1

## 2024-01-14 MED ORDER — MIRABEGRON ER 25 MG PO TB24
50.0000 mg | ORAL_TABLET | Freq: Every day | ORAL | Status: DC
Start: 2024-01-15 — End: 2024-01-17
  Administered 2024-01-15 – 2024-01-16 (×2): 50 mg via ORAL
  Filled 2024-01-14 (×2): qty 2

## 2024-01-14 MED ORDER — SODIUM CHLORIDE 0.9 % IV SOLN
100.0000 mg | Freq: Two times a day (BID) | INTRAVENOUS | Status: DC
  Administered 2024-01-14 – 2024-01-16 (×4): 100 mg via INTRAVENOUS
  Filled 2024-01-14 (×5): qty 100

## 2024-01-14 MED ORDER — SODIUM CHLORIDE 0.9 % IV SOLN
2.0000 g | INTRAVENOUS | Status: DC
  Administered 2024-01-14 – 2024-01-15 (×2): 2 g via INTRAVENOUS
  Filled 2024-01-14: qty 20

## 2024-01-14 MED ORDER — SODIUM CHLORIDE 0.9 % IV SOLN
500.0000 mg | INTRAVENOUS | Status: DC
  Administered 2024-01-14: 500 mg via INTRAVENOUS
  Filled 2024-01-14 (×2): qty 5

## 2024-01-14 MED ORDER — PREDNISONE 20 MG PO TABS
20.0000 mg | ORAL_TABLET | Freq: Every day | ORAL | Status: DC
  Administered 2024-01-14 – 2024-01-16 (×3): 20 mg via ORAL
  Filled 2024-01-14 (×3): qty 1

## 2024-01-14 MED ORDER — HYDROCODONE-ACETAMINOPHEN 10-325 MG PO TABS: 1.0000 | ORAL_TABLET | Freq: Four times a day (QID) | ORAL | Status: DC | PRN

## 2024-01-14 MED ORDER — LACTATED RINGERS IV BOLUS
1000.0000 mL | Freq: Once | INTRAVENOUS | Status: AC
  Administered 2024-01-14: 1000 mL via INTRAVENOUS

## 2024-01-14 MED ORDER — CHLORHEXIDINE GLUCONATE CLOTH 2 % EX PADS
6.0000 | MEDICATED_PAD | Freq: Every day | CUTANEOUS | Status: DC
  Administered 2024-01-14 – 2024-01-16 (×3): 6 via TOPICAL

## 2024-01-14 MED ORDER — IOHEXOL 350 MG/ML SOLN
75.0000 mL | Freq: Once | INTRAVENOUS | Status: AC | PRN
  Administered 2024-01-14: 75 mL via INTRAVENOUS

## 2024-01-14 NOTE — IPAL (Signed)
  Interdisciplinary Goals of Care Family Meeting   Date carried out: 01/14/2024  Location of the meeting: Bedside  Member's involved: Physician, Bedside Registered Nurse, Family Member or next of kin, and Other: NP Asencion Blacksmith Power of Attorney or Environmental health practitioner: husband at home asleep; daughter at bedside    Discussion: We discussed goals of care for Katelyn Lamb .  I try to clarify what previous discussions have been had since she has been enrolled with hospice.  Family was not aware that she was on life support medication (norepinephrine ).  She was on BiPAP overnight.  They did not have information from previous discussions.  Her daughter seemed very surprised when I asked these questions and left the room.  Based on a phone conversation with her son and her daughter later, it does not sound that these discussions have ever been had with hospice (whether or not to rehospitalize, use of life support medications, use of antibiotics, use of supplemental nutrition).  We discussed that it would be helpful to have her husband come to the hospital later today for them to have an opportunity to discuss as a family and communicate to us  how aggressive they would like us  to be with her mother's care.  One of the issues that I brought up was the potential discomfort that can be associated with aggressive treatment; specifically she has very difficult to control diarrhea that has been difficult for even palliative care to manage, and the antibiotics she is getting will most certainly worsen this, potentially for several weeks.  They report she has a DNR, but she is listed as partial code in the chart.  They understand my concern about potential for decline that can sometimes happen precipitously and need to clarify goals so we are all on the same page.  Code status: Limited Code or DNR with short term  Disposition: Continue current acute care; for follow-up discussion later today   Time  spent for the meeting: 35 min.  Katelyn Lamb 01/14/2024, 1:57 PM

## 2024-01-14 NOTE — Progress Notes (Signed)
 Chaplain visited while rounding on unit, pt was alert intermittently during this visit. Pt's daughter was at bedside, later 3 other family members joined the conversation. Chaplain provided reflective listening, orientation to chaplain services and facilitated having additional seating provided to family members. Additional support available upon request.  Chaplain Levern Reader, M. Div.    01/14/24 1000  Spiritual Encounters  Type of Visit Initial  Care provided to: Patient;Family  Referral source Nurse (RN/NT/LPN)  Reason for visit Routine spiritual support  OnCall Visit No  Spiritual Framework  Presenting Themes Meaning/purpose/sources of inspiration  Community/Connection Family  Patient Stress Factors Health changes  Interventions  Spiritual Care Interventions Made Established relationship of care and support;Compassionate presence  Spiritual Care Plan  Spiritual Care Issues Still Outstanding No further spiritual care needs at this time (see row info)

## 2024-01-14 NOTE — Progress Notes (Signed)
 Katelyn Lamb [address] [facility] Liaison Note?     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 On the evening of 5.12 patient's husband called [facility] to report that patient became non-responsive, cold and clammy and her O2 sats dropped to the 70's. Nurse visit was offered but due to low O2 sats and no oxygen  in the home, EMS was called to assist and took her to the ED. Patient was admitted on 5.13 with concerns for hypoxia, aspiration pneumonia, sepsis and UTI. Per Dr. Tessie Fila with [facility] this is a related hospital admission.    Visited patient in hospital. She was sleeping peacefully so attempt to wake her was not made. Spoke with patient's daughter outside the room and she advised that the family has been asked to have a GOC discussion today to determine if current treatment is to be continued.   Patient is inpatient appropriate due to need for IV medications and antibiotics.    Vital Signs:?97.7/79/24    98/58    94% on 11LPM HFNC   I/O:  1450/not documented   Abnormal labs:  01/14/24 01:35 COMPREHENSIVE METABOLIC PANEL WITH GFR:  Potassium: 3.3 (L) Glucose: 150 (H) Calcium : 7.5 (L) Albumin: 2.7 (L) AST: 57 (H) ALT: 45 (H) Total Protein: 5.4 (L)  01/14/24 02:20 pO2, Ven: 55 (H) Acid-Base Excess: 3.2 (H) Bicarbonate: 30.9 (H)   Diagnostics: CLINICAL DATA:  Shortness of breath hypoxia   EXAM: PORTABLE CHEST 1 VIEW   COMPARISON:  05/16/2022   FINDINGS: Right infrahilar opacity. Normal cardiac size. No pleural effusion.   IMPRESSION: Right infrahilar opacity may reflect atelectasis or minimal pneumonia.   Electronically Signed   By: Esmeralda Hedge M.D.   On: 01/14/2024 00:14 CLINICAL DATA:  Altered mental status   EXAM: CT HEAD WITHOUT CONTRAST   TECHNIQUE: Contiguous axial images were obtained from the base of the skull through the vertex without intravenous  contrast.   RADIATION DOSE REDUCTION: This exam was performed according to the departmental dose-optimization program which includes automated exposure control, adjustment of the mA and/or kV according to patient size and/or use of iterative reconstruction technique.   COMPARISON:  04/29/2022   FINDINGS: Brain: There is no mass, hemorrhage or extra-axial collection. There is generalized atrophy without lobar predilection. Hypodensity of the white matter is most commonly associated with chronic microvascular disease.   Vascular: Atherosclerotic calcification of the internal carotid arteries at the skull base. No abnormal hyperdensity of the major intracranial arteries or dural venous sinuses.   Skull: The visualized skull base, calvarium and extracranial soft tissues are normal.   Sinuses/Orbits: No fluid levels or advanced mucosal thickening of the visualized paranasal sinuses. No mastoid or middle ear effusion. Normal orbits.   Other: None.   IMPRESSION: 1. No acute intracranial abnormality. 2. Generalized atrophy and findings of chronic microvascular disease.    Electronically Signed   By: Juanetta Nordmann M.D.   On: 01/14/2024 03:36  Narrative & Impression CLINICAL DATA:  Altered mental status and hypoxia with suspected pulmonary embolism, past history of pulmonary embolism.   EXAM: CT ANGIOGRAPHY CHEST WITH CONTRAST   TECHNIQUE: Multidetector CT imaging of the chest was performed using the standard protocol during bolus administration of intravenous contrast. Multiplanar CT image reconstructions and MIPs were obtained to evaluate the vascular anatomy.   RADIATION DOSE REDUCTION: This exam was performed according to the departmental dose-optimization program which includes automated exposure control,  adjustment of the mA and/or kV according to patient size and/or use of iterative reconstruction technique.   CONTRAST:  75mL OMNIPAQUE  IOHEXOL  350 MG/ML SOLN    COMPARISON:  Portable chest today, portable chest 04/29/2022, portable chest 09/15/2021, and CTA chest 08/07/2021   FINDINGS: Cardiovascular: The pulmonary arteries are upper limits of normal in caliber but do not show embolic filling defects, with resolution of the bilateral segmental and subsegmental arterial emboli noted previously.   The cardiac size normal. Single-vessel calcific plaque is again noted in the proximal LAD coronary artery. There is no pericardial effusion.   There is mild aortic atherosclerosis, unremarkable great vessels, and no aneurysm, stenosis or dissection. The pulmonary veins are normal caliber.   There is a small amount of air in the left brachiocephalic arch in the midline which was probably either injected with the contrast or at the time of the patient's IV insertion.   Mediastinum/Nodes: No enlarged mediastinal, hilar, or axillary lymph nodes. Thyroid  gland, trachea, and esophagus demonstrate no significant findings.   Lungs/Pleura: There is a moderately elevated right hemidiaphragm, seen previously.   There is a small amount of fluid in the right main bronchus tracking into the right lower lobe main lobar bronchus.   There is consolidation consistent with pneumonia or aspiration, most dense and confluent in the posterior aspect of the right lower lobe, with patchy airspace disease in the apical and posterior segments of the right upper lobe, small amount of airspace disease in the infrahilar right middle lobe.   Remaining bilateral lungs are generally clear. There is asymmetric bronchial thickening in the right lung.   Upper Abdomen: No acute findings.   Musculoskeletal: Osteopenia with degenerative changes of thoracic spine.   A mild anterior wedging T12 vertebral body is chronic. Other thoracic vertebrae are normal in heights.   No acute further or significant osseous findings or focal abnormality of the visualized chest wall.    Review of the MIP images confirms the above findings.   IMPRESSION: 1. No evidence of arterial dilatation or embolus. Resolution of the bilateral segmental and subsegmental arterial emboli noted previously. 2. Aortic and coronary artery atherosclerosis. Upper limit of normal pulmonary artery dimensions. 3. Elevated right hemidiaphragm, chronic. 4. Multilobar right lung airspace disease consistent with pneumonia or aspiration, most dense and confluent in the posterior right lower lobe. 5. Small amount of fluid in the right main bronchus tracking into the right lower lobe main lobar bronchus. 6. Asymmetric right lung bronchitis. 7. Osteopenia and degenerative change.  Mild chronic wedging of T12.   Aortic Atherosclerosis (ICD10-I70.0).   Electronically Signed   By: Denman Fischer M.D.   On: 01/14/2024 04:03   IV/PRN Meds:   Levophed  IV 2-10 mcg/min titrated, Flagyl  500 mg IV x1, LR 1,000 mL x 1, Rocephin  2g x1, Zithromax  500 mg IV x1  Problem list per MD note Lezlie Reddish, DO 5.13.25:    Assessment & Plan:  Septic shock, presume 2/2 urosepsis vs PNA Urosepsis History of ESBL infection - Admit to ICU/SDU - Goal MAP > 65 - Fluid resuscitation as tolerated - Levophed  titrated to goal MAP>60 - Trend WBC, fever curve - F/u Cx data - Continue broad-spectrum antibiotics (ceftriaxone , Flagyl , azithromycin )   Neurogenic bladder s/p suprapubic catheter placement UA appears dirty on admission, leuk/nitrite positive. - F/u Cx - Broad-spectrum abx as above - Suprapubic catheter care per protocol   Encephalomyelitis Incomplete paraplegia Seizure disorder (in chart but pt and husband deny) Chronic diarrhea -investigate new medication  that was started to see if has contributed to presentation   Chronic pain syndrome   DVT/PE CTA Chest negative for PE.    CKD - Trend BMP - Replete electrolytes as indicated - Monitor I&Os - Avoid nephrotoxic agents as able - Ensure  adequate renal perfusion   Anxiety   GOC: Ongoing. Family discussion planned for this afternoon. ?  Discharge Planning:? Discharge back home if medically stable   Family contact: spoke with daughter at bedside   IDT:? Updated?     Goals of Care: DNR  If patient requires EMS transport at discharge, please us  [facility] as that is who [facility] is contracted with for transport.   Please call with any hospice related questions or concerns.   Ardine Beckwith, LPN Hospice hospital liaison [phone]

## 2024-01-14 NOTE — Progress Notes (Addendum)
 [id number] [employer] Hospice liaison note     This patient is a current hospice patient with [facility]. Please see media tab for detailed hospice report.    Liaison will continue to follow for any discharge planning needs and to coordinate continuation of hospice care.    Please don't hesitate to call with any hospice related questions or concerns.    Thank you for the opportunity to participate in this patient's care.   Ardine Beckwith, LPN [facility] Liaison [phone]

## 2024-01-14 NOTE — H&P (Signed)
 NAME:  Katelyn Lamb, MRN:  [id number], DOB:  06/13/1946, LOS: 0 ADMISSION DATE:  01/13/2024 CONSULTATION DATE:  01/14/2024 REFERRING MD:  Monique Ano - EDP, CHIEF COMPLAINT:  AMS, hypotension   History of Present Illness:  78 year old woman who presented to [facility] ED 5/12 for AMS, cool/clammy extremities after new medication (liquid opium ). Of note, patient is a hospice patient. PMHx significant for encephalomyelitis, incomplete paraplegia, seizure disorder, chronic pain syndrome, neurogenic bladder s/p suprapubic catheter placement, history of ESBL infection, DVT/PE, CKD, anxiety.  Patient's husband reported that patient had just been started on liquid opium  for diarrhea one day PTA. She was in her USOH the morning of admission until around 1500 when he came to check on her after giving her time to nap and noted that she was "gagging on her saliva" and "staring off into space". He called EMS after she was not responding appropriately to him.   On ED arrival, patient was noted to have LG fever to 100.37F, tachycardia to 130s, SBP 90s (palpated), SpO2 75% on RA. Foley was noted to be draining dark urine and 500mL LR bolus given. Labs were notable for WBC 20.2, Hgb 18.2, Plt 208. Na 139, K 4.1, CO2 30, BUN/Cr 20/0.84. Mildly elevated transaminases. Ammonia 20. LA 3.6 > 3.2. UA turbid with small Hgb, ++protein, +nitrite, +leuks. COVID/Flu/RSV negative. VBG pH 7.32/pCO2 60/bicarb 30.9. BCx and UCx pending. CXR with R infrahilar opacity. CT Head NAICA, CTA Chest negative for PE with resolution of prior PEs, chronic elevated R hemidiaphragm, multilobar R lung airspace disease c/w PNA vs. aspiration. Patient was started on broad-spectrum antibiotics and planned for [facility] admission.  Unfortunately, BPs remained marginal and PCCM consulted for urosepsis with possible need for vasopressor initiation.  Lastly pt was recently started on a new medication for her chronic diarrhea that she started approximately 36-48 hours ago,  they are unclear of medication name but states it is a liquid she takes 3 times a day, has had total of 5 doses when she had this episode as well. State that it has "opium  and alcohol" in it. Will need to investigate this further. Pt endorses she can barely keep her eyes open now when she takes it.  Pertinent Medical History:   Past Medical History:  Diagnosis Date   Acute deep vein thrombosis (DVT) of popliteal vein of left lower extremity (HCC)    Anxiety    Back pain    Chronic kidney disease    Encephalomyelitis    Gait abnormality 11/14/2016   Memory difficulty 03/02/2019   Myelitis due to herpes simplex [facility])    Neurogenic bladder    Significant Hospital Events: Including procedures, antibiotic start and stop dates in addition to other pertinent events   5/12 - Presented to [facility] ED for AMS, hypotension. C/f urosepsis. 5/13 - PCCM consulted for marginal BP, possible pressors.  Interim History / Subjective:  PCCM consulted for marginal BP, possible vasopressor initiation.  Objective:   Blood pressure 96/64, pulse 94, temperature 98 F (36.7 C), temperature source Oral, resp. rate 15, SpO2 93%.    FiO2 (%):  [40 %-80 %] 70 %   Intake/Output Summary (Last 24 hours) at 01/14/2024 0547 Last data filed at 01/14/2024 0428 Gross per 24 hour  Intake 1450 ml  Output --  Net 1450 ml   There were no vitals filed for this visit.  Physical Examination: General: Chronically ill-appearing reclining in bed in NAD. HEENT: [place]/AT, anicteric sclera, PERRL, moist mucous membranes. Neuro: awake oriented,  slowed speech Responds to verbal stimuli. Following commands consistently. Moves BUE spontaneously.  CV: RRR, no m/g/r. PULM: Breathing even and unlabored. Lung fields clear with exception of RUL. GI: Soft, nontender, nondistended. Normoactive bowel sounds. Extremities:  + LE edema noted. Skin: Warm/dry, chronic suprapubic catheter.  Resolved Hospital Problem List:    Assessment &  Plan:  Septic shock, presume 2/2 urosepsis vs PNA Urosepsis History of ESBL infection - Admit to ICU/SDU - Goal MAP > 65 - Fluid resuscitation as tolerated - Levophed  titrated to goal MAP>60 - Trend WBC, fever curve - F/u Cx data - Continue broad-spectrum antibiotics (ceftriaxone , Flagyl , azithromycin )  Neurogenic bladder s/p suprapubic catheter placement UA appears dirty on admission, leuk/nitrite positive. - F/u Cx - Broad-spectrum abx as above - Suprapubic catheter care per protocol  Encephalomyelitis Incomplete paraplegia Seizure disorder (in chart but pt and husband deny) Chronic diarrhea -investigate new medication that was started to see if has contributed to presentation  Chronic pain syndrome  DVT/PE CTA Chest negative for PE.   CKD - Trend BMP - Replete electrolytes as indicated - Monitor I&Os - Avoid nephrotoxic agents as able - Ensure adequate renal perfusion  Anxiety  GOC Hospice status Will need ongoing goc discussion. At this time family states they would want lines and pressors if needed but still maintains dnr/dni - DNR Code Status noted on last MOST form in chart, husband also brought documents  Best Practice: (right click and "Reselect all SmartList Selections" daily)   Diet/type: NPO w/ oral meds DVT prophylaxis: SCD GI prophylaxis: PPI Lines: N/A Foley:  Yes, and it is still needed Code Status:  DNR Last date of multidisciplinary goals of care discussion [with husband and pt at bedside 5/13]  Labs:  CBC: Recent Labs  Lab 01/13/24 0030  WBC 20.2*  HGB 18.2*  HCT 58.5*  MCV 104.5*  PLT 208   Basic Metabolic Panel: Recent Labs  Lab 01/13/24 0030 01/14/24 0135  NA 139 138  K 4.1 3.3*  CL 97* 104  CO2 30 26  GLUCOSE 144* 150*  BUN 20 17  CREATININE 0.84 0.74  CALCIUM  10.0 7.5*   GFR: CrCl cannot be calculated (Unknown ideal weight.). Recent Labs  Lab 01/13/24 0030 01/14/24 0053 01/14/24 0515  WBC 20.2*  --   --    LATICACIDVEN  --  3.6* 3.2*   Liver Function Tests: Recent Labs  Lab 01/14/24 0135  AST 57*  ALT 45*  ALKPHOS 68  BILITOT 1.0  PROT 5.4*  ALBUMIN 2.7*   No results for input(s): "LIPASE", "AMYLASE" in the last 168 hours. Recent Labs  Lab 01/14/24 0135  AMMONIA 20   ABG:    Component Value Date/Time   PHART 7.424 07/18/2021 0607   PCO2ART 41.3 07/18/2021 0607   PO2ART 450 (H) 07/18/2021 0607   HCO3 30.9 (H) 01/14/2024 0220   TCO2 31 09/15/2021 0523   ACIDBASEDEF 0.9 02/12/2020 1220   O2SAT 88.9 01/14/2024 0220    Coagulation Profile: Recent Labs  Lab 01/14/24 0252  INR 1.1   Cardiac Enzymes: No results for input(s): "CKTOTAL", "CKMB", "CKMBINDEX", "TROPONINI" in the last 168 hours.  HbA1C: Hgb A1c MFr Bld  Date/Time Value Ref Range Status  06/27/2022 10:58 AM 5.2 4.8 - 5.6 % Final    Comment:    (NOTE) Pre diabetes:          5.7%-6.4%  Diabetes:              >6.4%  Glycemic control for   <  7.0% adults with diabetes   03/15/2021 04:43 AM 5.9 (H) 4.8 - 5.6 % Final    Comment:    (NOTE) Pre diabetes:          5.7%-6.4%  Diabetes:              >6.4%  Glycemic control for   <7.0% adults with diabetes    CBG: No results for input(s): "GLUCAP" in the last 168 hours.  Review of Systems:   As per hpi  Past Medical History:  She,  has a past medical history of Acute deep vein thrombosis (DVT) of popliteal vein of left lower extremity (HCC), Anxiety, Back pain, Chronic kidney disease, Encephalomyelitis, Gait abnormality (11/14/2016), Memory difficulty (03/02/2019), Myelitis due to herpes simplex (HCC), and Neurogenic bladder.   Surgical History:   Past Surgical History:  Procedure Laterality Date   ABDOMINAL HYSTERECTOMY     BLADDER REPAIR     CESAREAN SECTION     IR ANGIO EXTERNAL CAROTID SEL EXT CAROTID BILAT MOD SED  07/18/2021   IR ANGIO INTRA EXTRACRAN SEL INTERNAL CAROTID BILAT MOD SED  07/18/2021   IR KYPHO LUMBAR INC FX REDUCE BONE BX  UNI/BIL CANNULATION INC/IMAGING  04/11/2018   IR NEURO EACH ADD'L AFTER BASIC UNI LEFT (MS)  07/18/2021   IR NEURO EACH ADD'L AFTER BASIC UNI RIGHT (MS)  07/18/2021   IR TRANSCATH/EMBOLIZ  07/18/2021   IR US  GUIDE VASC ACCESS RIGHT  07/18/2021   RADIOLOGY WITH ANESTHESIA N/A 07/18/2021   Procedure: IR WITH ANESTHESIA;  Surgeon: de Macedo Rodrigues, Katyucia, MD;  Location: [facility] OR;  Service: Radiology;  Laterality: N/A;   TUBAL LIGATION      Social History:   reports that she quit smoking about 44 years ago. Her smoking use included cigarettes. She started smoking about 54 years ago. She has a 20 pack-year smoking history. She has never used smokeless tobacco. She reports that she does not drink alcohol and does not use drugs.   Family History:  Her family history includes Hypertension in her mother.   Allergies: Allergies  Allergen Reactions   Demerol [Meperidine] Other (See Comments)    Hallucinations   Hydrocodone -Acetaminophen  Other (See Comments)   Percocet [Oxycodone -Acetaminophen ] Itching   Amoxicillin -Pot Clavulanate Diarrhea    Severe pain, headache, intestinal infection   Lamotrigine  Rash   Penicillins Itching and Rash    Tolerated amoxicillin  November 2022  Has patient had a PCN reaction causing immediate rash, facial/tongue/throat swelling, SOB or lightheadedness with hypotension:  NO Has patient had a PCN reaction causing severe rash involving mucus membranes or skin necrosis: No Has patient had a PCN reaction that required hospitalization: No Has patient had a PCN reaction occurring within the last 10 years: Yes If all of the above answers are "NO", then may proceed with Cephalosporin use.   Home Medications: Prior to Admission medications   Medication Sig Start Date End Date Taking? Authorizing Provider  acyclovir  (ZOVIRAX ) 400 MG tablet TAKE 1 TABLET BY MOUTH TWICE A DAY 10/01/23   Sater, Sherida Dimmer, MD  albuterol  (PROVENTIL ) (2.5 MG/3ML) 0.083% nebulizer solution Take  3 mLs (2.5 mg total) by nebulization every 2 (two) hours as needed for wheezing or shortness of breath. 08/15/21   Ghimire, Estil Heman, MD  apixaban  (ELIQUIS ) 5 MG TABS tablet Take 1 tablet (5 mg total) by mouth 2 (two) times daily. 08/15/21   Ghimire, Estil Heman, MD  Ascorbic Acid (VITAMIN C) 500 MG CHEW Chew 500 mg by mouth daily.  [provider]  azithromycin  (ZITHROMAX ) 250 MG tablet Take two tablets on day 1 followed by one tablet daily for days 2-5 05/01/23   Quillian Brunt, MD  Calcium  Carb-Cholecalciferol  (CALCIUM  600 + D PO) Take 1 tablet by mouth in the morning and at bedtime.    [provider]  Cyanocobalamin  (VITAMIN B-12 PO) Take 500 mcg by mouth daily.    [provider]  diazepam  (VALIUM ) 5 MG tablet TAKE 1 TABLET (5 MG TOTAL) BY MOUTH EVERY 12 (TWELVE) HOURS AS NEEDED (FOR TREMORS). 10/07/23   Sater, Sherida Dimmer, MD  DULoxetine  (CYMBALTA ) 60 MG capsule TAKE 1 CAPSULE (60 MG TOTAL) BY MOUTH AT BEDTIME. 12/25/23   Sater, Sherida Dimmer, MD  feeding supplement (ENSURE ENLIVE / ENSURE PLUS) LIQD Take 237 mLs by mouth 2 (two) times daily between meals. 08/15/21   Ghimire, Estil Heman, MD  ferrous sulfate  325 (65 FE) MG tablet Take 325 mg by mouth every morning. 03/10/21   [provider]  fluticasone -salmeterol (ADVAIR) 100-50 MCG/ACT AEPB Inhale 1 puff into the lungs 2 (two) times daily. 02/15/23   Quillian Brunt, MD  midodrine  (PROAMATINE ) 5 MG tablet Take 1 tablet (5 mg total) by mouth 2 (two) times daily with a meal. 07/03/22   Theadore Finger, MD  omega-3 acid ethyl esters (LOVAZA ) 1 g capsule Take 1 g by mouth every morning.    [provider]  pantoprazole  (PROTONIX ) 40 MG tablet Take 1 tablet (40 mg total) by mouth daily. 10/26/16   Angiulli, Everlyn Hockey, PA-C  predniSONE  (DELTASONE ) 5 MG tablet TAKE 1 TABLET BY MOUTH EVERY DAY WITH BREAKFAST 12/09/23   Sater, Sherida Dimmer, MD  pregabalin  (LYRICA ) 50 MG capsule TAKE 1 CAPSULE BY MOUTH TWICE A DAY 10/23/23    Sater, Sherida Dimmer, MD  traZODone  (DESYREL ) 50 MG tablet TAKE 1 TABLET BY MOUTH EVERYDAY AT BEDTIME 12/30/23   Sater, Sherida Dimmer, MD    Critical care time:   The patient is critically ill with multiple organ system failure and requires high complexity decision making for assessment and support, frequent evaluation and titration of therapies, advanced monitoring, review of radiographic studies and interpretation of complex data.   Critical Care Time devoted to patient care services, exclusive of separately billable procedures, described in this note is 41 minutes.    Please see [email or web address] for pager details.  From 7A-7P if no response, please call [phone] After hours, please call ELink [phone]

## 2024-01-14 NOTE — ED Notes (Addendum)
 I-stat Lactic acid 3.18 EDP Groce notified.

## 2024-01-14 NOTE — IPAL (Signed)
  Interdisciplinary Goals of Care Family Meeting   Date carried out:: 01/14/2024  Location of the meeting: Bedside  Member's involved: Nurse Practitioner, Bedside Registered Nurse, and Family Member or next of kin  Durable Power of Attorney or acting medical decision maker: Husband Katelyn Lamb    Discussion: We discussed goals of care for Katelyn Lamb .  We discussed current illness. Chronic illness. Katelyn Dress shared with me that [name] has been bed ridden for about 2 years. She does not want advanced interventions however at this point the family would desire to still seek medical treatment this would include: hospital care, IVFs, antibiotics, limited vasoactive drips to peripheral dosing only. We will not use surgical procedures, central access, mechanical ventilation    Code status: Full DNR  Disposition: Continue current acute care   Time spent for the meeting: 32 min   Armstead Bertrand 01/14/2024, 4:29 PM

## 2024-01-14 NOTE — Progress Notes (Signed)
   01/14/24 0021  BiPAP/CPAP/SIPAP  $ Non-Invasive Ventilator  Non-Invasive Vent Initial  $ Face Mask Medium Yes  BiPAP/CPAP/SIPAP Pt Type Adult  BiPAP/CPAP/SIPAP V60  Mask Type Full face mask  Dentures removed? Not applicable  Mask Size Medium  Set Rate 14 breaths/min  Respiratory Rate 14 breaths/min  IPAP 16 cmH20  EPAP 8 cmH2O  FiO2 (%) 40 %  Flow Rate 0 lpm  Minute Ventilation 6.9  Leak 0  Peak Inspiratory Pressure (PIP) 17  Tidal Volume (Vt) 594  Patient Home Machine No  Patient Home Mask No  Patient Home Tubing No  Auto Titrate No  Press High Alarm 25 cmH2O  Press Low Alarm 5 cmH2O  Device Plugged into [employer] Power Outlet Yes

## 2024-01-14 NOTE — Progress Notes (Signed)
 NAME:  Katelyn Lamb, MRN:  [id number], DOB:  [birth year], LOS: 0 ADMISSION DATE:  01/13/2024 CONSULTATION DATE:  01/14/2024 REFERRING MD:  Monique Ano - EDP, CHIEF COMPLAINT:  AMS, hypotension   History of Present Illness:  78 year old woman who presented to [facility] ED 5/12 for AMS, cool/clammy extremities after new medication (liquid opium ). Of note, patient is a hospice patient. PMHx significant for encephalomyelitis, incomplete paraplegia, seizure disorder, chronic pain syndrome, neurogenic bladder s/p suprapubic catheter placement, history of ESBL infection, DVT/PE, CKD, anxiety.  Patient's husband reported that patient had just been started on liquid opium  for diarrhea one day PTA. She was in her USOH the morning of admission until around 1500 when he came to check on her after giving her time to nap and noted that she was "gagging on her saliva" and "staring off into space". He called EMS after she was not responding appropriately to him.   On ED arrival, patient was noted to have LG fever to 100.77F, tachycardia to 130s, SBP 90s (palpated), SpO2 75% on RA. Foley was noted to be draining dark urine and 500mL LR bolus given. Labs were notable for WBC 20.2, Hgb 18.2, Plt 208. Na 139, K 4.1, CO2 30, BUN/Cr 20/0.84. Mildly elevated transaminases. Ammonia 20. LA 3.6 > 3.2. UA turbid with small Hgb, ++protein, +nitrite, +leuks. COVID/Flu/RSV negative. VBG pH 7.32/pCO2 60/bicarb 30.9. BCx and UCx pending. CXR with R infrahilar opacity. CT Head NAICA, CTA Chest negative for PE with resolution of prior PEs, chronic elevated R hemidiaphragm, multilobar R lung airspace disease c/w PNA vs. aspiration. Patient was started on broad-spectrum antibiotics and planned for [facility] admission.  Unfortunately, BPs remained marginal and PCCM consulted for urosepsis with possible need for vasopressor initiation.  Lastly pt was recently started on a new medication for her chronic diarrhea that she started approximately 36-48 hours ago,  they are unclear of medication name but states it is a liquid she takes 3 times a day, has had total of 5 doses when she had this episode as well. State that it has "opium  and alcohol" in it. Will need to investigate this further. Pt endorses she can barely keep her eyes open now when she takes it.  Pertinent Medical History:   Past Medical History:  Diagnosis Date   Acute deep vein thrombosis (DVT) of popliteal vein of left lower extremity (HCC)    Anxiety    Back pain    Chronic kidney disease    Encephalomyelitis    Gait abnormality 11/14/2016   Memory difficulty 03/02/2019   Myelitis due to herpes simplex [facility])    Neurogenic bladder    Significant Hospital Events: Including procedures, antibiotic start and stop dates in addition to other pertinent events   5/12 - Presented to [facility] ED for AMS, hypotension. C/f urosepsis. 5/13 - PCCM consulted for marginal BP, possible pressors.  Interim History / Subjective:  Ms. Peagler denies complaints. Per her family, she is not at her baseline mental status.   Objective:   Blood pressure (!) 100/50, pulse 73, temperature 97.8 F (36.6 C), temperature source Oral, resp. rate 12, height 5\' 2"  (1.575 m), weight 72.2 kg, SpO2 93%.    FiO2 (%):  [40 %-80 %] 70 %   Intake/Output Summary (Last 24 hours) at 01/14/2024 1623 Last data filed at 01/14/2024 1423 Gross per 24 hour  Intake 1682.24 ml  Output 325 ml  Net 1357.24 ml   Filed Weights   01/14/24 0756  Weight: 72.2 kg  Physical Examination: General: Chronically ill-appearing woman lying in bed no acute distress HEENT: [place]/AT, eyes anicteric Neuro: Awake, slow to respond to questions.  Not oriented to day, date, month, year.  She knows she is at [facility]. CV: S1S2, RRR PULM: Breathing comfortably on [place], no accessory muscle use or tachypnea. Faint basilar rhales. GI: obese, soft, NT. Suprapubic catheter in place.  Extremities:  mild LE edema       Resolved Hospital  Problem List:     Assessment & Plan:  Septic shock, RLL PNA. Low suspicion for UTI given that the urine was taken from chronic suprapubic catheter. History of ESBL infection -NE to maintain MAP >65 -con't antibiotics for aspiration pneumonia-- doxy, ceftriaxone  -Family does not want invasive procedures (Aline, CVC) or high doses of vasopressors> see ipal notes.  -follow cultures  Neurogenic bladder s/p suprapubic catheter placement -change suprapubic catheter today -can reculture if concerned  Chronic diarrhea; has failed imodium and is on opium  at home per family -if having excessive diarrhea, check GI panel & c diff -family understands that antibiotics may worsen diarrhea  Acute encephalopathy likely due to sepsis vs medication side effect Encephalomyelitis- chronic due to previous HSV Incomplete paraplegia; bedbound at baseline Seizure disorder (in chart but pt and husband deny) Chronic diarrhea -investigate new medication that was started to see if has contributed to presentation> may try at a lower dose if her diarrhea comes back -resume PTA lyrica , baclofen , valium  to prevent withdrawal  Chronic pain syndrome -resume PTA lyrica , baclofen , valium  to prevent withdrawal -resume PTA Norco  DVT & PE in 2022; high risk with being bedbound -con't PTA apixaban ; family has expressed desire for no procedures  CKD 2 -Renally dose meds, avoid nephrotoxic meds  Anxiety -Continue PTA Valium  PRN; hold additional as needed Ativan  GOC Hospice status- [facility] -See separate Ipal note from today  Best Practice: (right click and "Reselect all SmartList Selections" daily)   Diet/type: NPO w/ oral meds> advance as tolerated if she passes bedside swallow DVT prophylaxis: DOAC GI prophylaxis: PPI Lines: N/A Foley:  Yes, and it is still needed-- chronic suprapubic catheter Code Status:  DNR- comfort care only in the case of an arrest Last date of multidisciplinary goals of care  discussion [multiple updates during the day 5/13 ]  Labs:  CBC: Recent Labs  Lab 01/13/24 0030  WBC 20.2*  HGB 18.2*  HCT 58.5*  MCV 104.5*  PLT 208   Basic Metabolic Panel: Recent Labs  Lab 01/13/24 0030 01/14/24 0135  NA 139 138  K 4.1 3.3*  CL 97* 104  CO2 30 26  GLUCOSE 144* 150*  BUN 20 17  CREATININE 0.84 0.74  CALCIUM  10.0 7.5*   GFR: Estimated Creatinine Clearance: 55.6 mL/min (by C-G formula based on SCr of 0.74 mg/dL). Recent Labs  Lab 01/13/24 0030 01/14/24 0053 01/14/24 0515 01/14/24 1147 01/14/24 1408  WBC 20.2*  --   --   --   --   LATICACIDVEN  --  3.6* 3.2* 2.2* 2.3*     Critical care time:   This patient is critically ill with multiple organ system failure which requires frequent high complexity decision making, assessment, support, evaluation, and titration of therapies. This was completed through the application of advanced monitoring technologies and extensive interpretation of multiple databases. During this encounter critical care time was devoted to patient care services described in this note for 33 minutes.  Joesph Mussel, DO 01/14/24 4:56 PM [facility]  For contact information, see Amion. If no response to pager, please call PCCM consult pager. After hours, 7PM- 7AM, please call Elink.

## 2024-01-14 NOTE — Sepsis Progress Note (Signed)
 Following for sepsis monitoring ?

## 2024-01-14 NOTE — Progress Notes (Signed)
 PT taken off BiPAP, tolerating well at this time.

## 2024-01-14 NOTE — ED Provider Notes (Signed)
 [facility] AT [facility] Provider Note   CSN: [id number] Arrival date & time: 01/13/24  2328     History  Chief Complaint  Patient presents with   Altered Mental Status    Katelyn Lamb is a 78 y.o. female with medical history of neurogenic bladder, encephalomyelitis, chronic disease, anxiety, acute DVT of popliteal vein of left lower extremity, PEs, memory difficulty, chronic low back pain, incomplete paraplegia, chronic pain syndrome, history of ESBL E. coli infection, chronic suprapubic catheter, seizure disorder.  Patient presents to ED for evaluation of altered mental status.  On my examination, the patient is alert and orient x 2.  She denies any pain besides "my rear-end".  Majority of history was gathered from patient husband at the bedside.  The patient has been reports that the patient was placed on liquid opium  for chronic diarrhea beginning yesterday.  He reports that her dose of liquid opium  is 0.6 mL 4 times a day.  He reports that she began this medication last night and has taken 4 doses since beginning the medication.  Patient husband reports that earlier today the patient was in usual state of health.  He reports that she had visitors over around 130 and they left at 3.  He reports that he then left the patient in her bed to sleep and take a nap while he went upstairs to pay bills.  He reports that he came back to her bedside and noted that she was making a "gagging" sound on her spit.  He reports that she was "staring off into space".  He was concerned that she was not responding to him calling her name so he called EMS.  When EMS arrived, the patient was found to be hypoxic with oxygen  saturation 79% room air.  She was hypotensive, tachycardic.  Here in the ED, the patient is tachypneic, hypotensive, tachycardic and with a temperature of 100.1.  Her chronic indwelling Foley appears to be infected.  She responds to questions appropriately, she follows  commands, she is alert to self.  Husband denies any fevers, nausea, vomiting, diarrhea today at home.  The patient denies any chest pain or shortness of breath to me.   Altered Mental Status      Home Medications Prior to Admission medications   Medication Sig Start Date End Date Taking? Authorizing Provider  acyclovir  (ZOVIRAX ) 400 MG tablet TAKE 1 TABLET BY MOUTH TWICE A DAY 10/01/23   Sater, Sherida Dimmer, MD  albuterol  (PROVENTIL ) (2.5 MG/3ML) 0.083% nebulizer solution Take 3 mLs (2.5 mg total) by nebulization every 2 (two) hours as needed for wheezing or shortness of breath. 08/15/21   Ghimire, Estil Heman, MD  apixaban  (ELIQUIS ) 5 MG TABS tablet Take 1 tablet (5 mg total) by mouth 2 (two) times daily. 08/15/21   Ghimire, Estil Heman, MD  Ascorbic Acid (VITAMIN C) 500 MG CHEW Chew 500 mg by mouth daily.    [provider]  azithromycin  (ZITHROMAX ) 250 MG tablet Take two tablets on day 1 followed by one tablet daily for days 2-5 05/01/23   Quillian Brunt, MD  Calcium  Carb-Cholecalciferol  (CALCIUM  600 + D PO) Take 1 tablet by mouth in the morning and at bedtime.    [provider]  Cyanocobalamin  (VITAMIN B-12 PO) Take 500 mcg by mouth daily.    [provider]  diazepam  (VALIUM ) 5 MG tablet TAKE 1 TABLET (5 MG TOTAL) BY MOUTH EVERY 12 (TWELVE) HOURS AS NEEDED (FOR TREMORS).  10/07/23   Sater, Sherida Dimmer, MD  DULoxetine  (CYMBALTA ) 60 MG capsule TAKE 1 CAPSULE (60 MG TOTAL) BY MOUTH AT BEDTIME. 12/25/23   Sater, Sherida Dimmer, MD  feeding supplement (ENSURE ENLIVE / ENSURE PLUS) LIQD Take 237 mLs by mouth 2 (two) times daily between meals. 08/15/21   Ghimire, Estil Heman, MD  ferrous sulfate  325 (65 FE) MG tablet Take 325 mg by mouth every morning. 03/10/21   [provider]  fluticasone -salmeterol (ADVAIR) 100-50 MCG/ACT AEPB Inhale 1 puff into the lungs 2 (two) times daily. 02/15/23   Quillian Brunt, MD  midodrine  (PROAMATINE ) 5 MG tablet Take 1 tablet (5 mg total) by  mouth 2 (two) times daily with a meal. 07/03/22   Theadore Finger, MD  omega-3 acid ethyl esters (LOVAZA ) 1 g capsule Take 1 g by mouth every morning.    [provider]  pantoprazole  (PROTONIX ) 40 MG tablet Take 1 tablet (40 mg total) by mouth daily. 10/26/16   Angiulli, Everlyn Hockey, PA-C  predniSONE  (DELTASONE ) 5 MG tablet TAKE 1 TABLET BY MOUTH EVERY DAY WITH BREAKFAST 12/09/23   Sater, Sherida Dimmer, MD  pregabalin  (LYRICA ) 50 MG capsule TAKE 1 CAPSULE BY MOUTH TWICE A DAY 10/23/23   Sater, Sherida Dimmer, MD  traZODone  (DESYREL ) 50 MG tablet TAKE 1 TABLET BY MOUTH EVERYDAY AT BEDTIME 12/30/23   Sater, Sherida Dimmer, MD      Allergies    Demerol [meperidine], Hydrocodone -acetaminophen , Percocet [oxycodone -acetaminophen ], Amoxicillin -pot clavulanate, Lamotrigine , and Penicillins    Review of Systems   Review of Systems  Unable to perform ROS: Acuity of condition (Level 5 caveat)  All other systems reviewed and are negative.   Physical Exam Updated Vital Signs BP 97/63   Pulse 95   Temp (!) 102.4 F (39.1 C) (Rectal)   Resp 18   SpO2 90%  Physical Exam Vitals and nursing note reviewed.  HENT:     Head: Normocephalic and atraumatic.  Eyes:     General:        Right eye: No discharge.        Left eye: No discharge.     Pupils: Pupils are equal, round, and reactive to light.     Comments: Pupils PERRL  Cardiovascular:     Rate and Rhythm: Regular rhythm. Tachycardia present.  Pulmonary:     Breath sounds: Rhonchi present.  Abdominal:     Tenderness: There is no abdominal tenderness.  Musculoskeletal:     Cervical back: Normal range of motion and neck supple. No tenderness.     Right lower leg: No edema.     Left lower leg: No edema.  Skin:    General: Skin is warm and dry.     Capillary Refill: Capillary refill takes less than 2 seconds.  Neurological:     Comments: Alert to self     ED Results / Procedures / Treatments   Labs (all labs ordered are listed, but only abnormal  results are displayed) Labs Reviewed  CBC - Abnormal; Notable for the following components:      Result Value   WBC 20.2 (*)    RBC 5.60 (*)    Hemoglobin 18.2 (*)    HCT 58.5 (*)    MCV 104.5 (*)    All other components within normal limits  BASIC METABOLIC PANEL WITH GFR - Abnormal; Notable for the following components:   Chloride 97 (*)    Glucose, Bld 144 (*)    All other components  within normal limits  URINALYSIS, ROUTINE W REFLEX MICROSCOPIC - Abnormal; Notable for the following components:   APPearance TURBID (*)    Hgb urine dipstick SMALL (*)    Protein, ur >=300 (*)    Nitrite POSITIVE (*)    Leukocytes,Ua MODERATE (*)    Bacteria, UA MANY (*)    All other components within normal limits  COMPREHENSIVE METABOLIC PANEL WITH GFR - Abnormal; Notable for the following components:   Potassium 3.3 (*)    Glucose, Bld 150 (*)    Calcium  7.5 (*)    Total Protein 5.4 (*)    Albumin 2.7 (*)    AST 57 (*)    ALT 45 (*)    All other components within normal limits  BLOOD GAS, VENOUS - Abnormal; Notable for the following components:   pO2, Ven 55 (*)    Bicarbonate 30.9 (*)    Acid-Base Excess 3.2 (*)    All other components within normal limits  I-STAT CG4 LACTIC ACID, ED - Abnormal; Notable for the following components:   Lactic Acid, Venous 3.6 (*)    All other components within normal limits  RESP PANEL BY RT-PCR (RSV, FLU A&B, COVID)  RVPGX2  CULTURE, BLOOD (ROUTINE X 2)  CULTURE, BLOOD (ROUTINE X 2)  URINE CULTURE  AMMONIA  BRAIN NATRIURETIC PEPTIDE  PROTIME-INR  I-STAT CG4 LACTIC ACID, ED    EKG EKG Interpretation Date/Time:  Monday Jan 13 2024 23:40:59 EDT Ventricular Rate:  124 PR Interval:  142 QRS Duration:  88 QT Interval:  317 QTC Calculation: 456 R Axis:   269  Text Interpretation: Sinus tachycardia Anterolateral infarct, old Confirmed by Kelsey Patricia [phone]) on 01/14/2024 2:13:56 AM  Radiology CT Angio Chest PE W and/or Wo Contrast Result  Date: 01/14/2024 CLINICAL DATA:  Altered mental status and hypoxia with suspected pulmonary embolism, past history of pulmonary embolism. EXAM: CT ANGIOGRAPHY CHEST WITH CONTRAST TECHNIQUE: Multidetector CT imaging of the chest was performed using the standard protocol during bolus administration of intravenous contrast. Multiplanar CT image reconstructions and MIPs were obtained to evaluate the vascular anatomy. RADIATION DOSE REDUCTION: This exam was performed according to the departmental dose-optimization program which includes automated exposure control, adjustment of the mA and/or kV according to patient size and/or use of iterative reconstruction technique. CONTRAST:  75mL OMNIPAQUE  IOHEXOL  350 MG/ML SOLN COMPARISON:  Portable chest today, portable chest 04/29/2022, portable chest 09/15/2021, and CTA chest 08/07/2021 FINDINGS: Cardiovascular: The pulmonary arteries are upper limits of normal in caliber but do not show embolic filling defects, with resolution of the bilateral segmental and subsegmental arterial emboli noted previously. The cardiac size normal. Single-vessel calcific plaque is again noted in the proximal LAD coronary artery. There is no pericardial effusion. There is mild aortic atherosclerosis, unremarkable great vessels, and no aneurysm, stenosis or dissection. The pulmonary veins are normal caliber. There is a small amount of air in the left brachiocephalic arch in the midline which was probably either injected with the contrast or at the time of the patient's IV insertion. Mediastinum/Nodes: No enlarged mediastinal, hilar, or axillary lymph nodes. Thyroid  gland, trachea, and esophagus demonstrate no significant findings. Lungs/Pleura: There is a moderately elevated right hemidiaphragm, seen previously. There is a small amount of fluid in the right main bronchus tracking into the right lower lobe main lobar bronchus. There is consolidation consistent with pneumonia or aspiration, most dense  and confluent in the posterior aspect of the right lower lobe, with patchy airspace disease in the apical  and posterior segments of the right upper lobe, small amount of airspace disease in the infrahilar right middle lobe. Remaining bilateral lungs are generally clear. There is asymmetric bronchial thickening in the right lung. Upper Abdomen: No acute findings. Musculoskeletal: Osteopenia with degenerative changes of thoracic spine. A mild anterior wedging T12 vertebral body is chronic. Other thoracic vertebrae are normal in heights. No acute further or significant osseous findings or focal abnormality of the visualized chest wall. Review of the MIP images confirms the above findings. IMPRESSION: 1. No evidence of arterial dilatation or embolus. Resolution of the bilateral segmental and subsegmental arterial emboli noted previously. 2. Aortic and coronary artery atherosclerosis. Upper limit of normal pulmonary artery dimensions. 3. Elevated right hemidiaphragm, chronic. 4. Multilobar right lung airspace disease consistent with pneumonia or aspiration, most dense and confluent in the posterior right lower lobe. 5. Small amount of fluid in the right main bronchus tracking into the right lower lobe main lobar bronchus. 6. Asymmetric right lung bronchitis. 7. Osteopenia and degenerative change.  Mild chronic wedging of T12. Aortic Atherosclerosis (ICD10-I70.0). Electronically Signed   By: Denman Fischer M.D.   On: 01/14/2024 04:03   CT Head Wo Contrast Result Date: 01/14/2024 CLINICAL DATA:  Altered mental status EXAM: CT HEAD WITHOUT CONTRAST TECHNIQUE: Contiguous axial images were obtained from the base of the skull through the vertex without intravenous contrast. RADIATION DOSE REDUCTION: This exam was performed according to the departmental dose-optimization program which includes automated exposure control, adjustment of the mA and/or kV according to patient size and/or use of iterative reconstruction  technique. COMPARISON:  04/29/2022 FINDINGS: Brain: There is no mass, hemorrhage or extra-axial collection. There is generalized atrophy without lobar predilection. Hypodensity of the white matter is most commonly associated with chronic microvascular disease. Vascular: Atherosclerotic calcification of the internal carotid arteries at the skull base. No abnormal hyperdensity of the major intracranial arteries or dural venous sinuses. Skull: The visualized skull base, calvarium and extracranial soft tissues are normal. Sinuses/Orbits: No fluid levels or advanced mucosal thickening of the visualized paranasal sinuses. No mastoid or middle ear effusion. Normal orbits. Other: None. IMPRESSION: 1. No acute intracranial abnormality. 2. Generalized atrophy and findings of chronic microvascular disease. Electronically Signed   By: Juanetta Nordmann M.D.   On: 01/14/2024 03:36   DG Chest Portable 1 View Result Date: 01/14/2024 CLINICAL DATA:  Shortness of breath hypoxia EXAM: PORTABLE CHEST 1 VIEW COMPARISON:  05/16/2022 FINDINGS: Right infrahilar opacity. Normal cardiac size. No pleural effusion. IMPRESSION: Right infrahilar opacity may reflect atelectasis or minimal pneumonia. Electronically Signed   By: Esmeralda Hedge M.D.   On: 01/14/2024 00:14    Procedures .Critical Care  Performed by: Adel Aden, PA-C Authorized by: Adel Aden, PA-C   Critical care provider statement:    Critical care time (minutes):  55   Critical care time was exclusive of:  Separately billable procedures and treating other patients   Critical care was necessary to treat or prevent imminent or life-threatening deterioration of the following conditions:  Respiratory failure and sepsis   Critical care was time spent personally by me on the following activities:  Blood draw for specimens, development of treatment plan with patient or surrogate, discussions with consultants, discussions with primary provider, evaluation of  patient's response to treatment, interpretation of cardiac output measurements, obtaining history from patient or surrogate, ordering and performing treatments and interventions, ordering and review of laboratory studies, ordering and review of radiographic studies, pulse oximetry, review of old charts,  re-evaluation of patient's condition, ventilator management and examination of patient   I assumed direction of critical care for this patient from another provider in my specialty: no     Care discussed with: admitting provider       Medications Ordered in ED Medications  azithromycin  (ZITHROMAX ) 500 mg in sodium chloride  0.9 % 250 mL IVPB (0 mg Intravenous Stopped 01/14/24 0300)  acetaminophen  (TYLENOL ) suppository 650 mg (650 mg Rectal Given 01/14/24 0109)  sodium chloride  0.9 % bolus 500 mL (0 mLs Intravenous Stopped 01/14/24 0155)  naloxone  (NARCAN ) injection 0.1 mg (0.1 mg Intravenous Given 01/14/24 0112)  hydrocortisone  sodium succinate  (SOLU-CORTEF ) 100 MG injection 100 mg (100 mg Intravenous Given 01/14/24 0112)  cefTRIAXone  (ROCEPHIN ) 2 g in sodium chloride  0.9 % 100 mL IVPB (0 g Intravenous Stopped 01/14/24 0300)  metroNIDAZOLE  (FLAGYL ) IVPB 500 mg (0 mg Intravenous Stopped 01/14/24 0300)  iohexol  (OMNIPAQUE ) 350 MG/ML injection 75 mL (75 mLs Intravenous Contrast Given 01/14/24 0256)  lactated ringers  bolus 1,000 mL (0 mLs Intravenous Stopped 01/14/24 0428)    ED Course/ Medical Decision Making/ A&P    Medical Decision Making Amount and/or Complexity of Data Reviewed Labs: ordered. Radiology: ordered.  Risk OTC drugs. Prescription drug management.   79 year old female presents for evaluation.  Please see HPI for further details.  On examination the patient is tachycardic, febrile and hypotensive.  Her oxygen  saturation is 91% on a nonrebreather at 12 L/min.  She has rhonchorous lung sounds throughout.  Her abdomen is soft and compressible.  Her neurological examination shows no  focal deficits.  She follows commands appropriately.  She is alert to self.  She has equal strength to upper extremities bilaterally.  She is able to track across the midline.   Patient immediately transitioned to BiPAP at this time, contacted RT who came down to patient bedside.  Patient oxygen  saturation improved to 96% on BiPAP.  Patient was given small dose of Narcan , 0.1 mg, due to her recently starting liquid opium .   Concern for sepsis in this patient.  Will collect a CBC, CMP, urinalysis, lactic acid, blood cultures, ammonia, viral panel, PT/INR, BNP, VBG.  Will add on chest x-ray for hypoxia, CT head due to altered mental status.  Will also add on CTA as patient has history of PE, DVTs.  Patient CBC with leukocytosis of 20.2, hemoglobin 18.2.  Metabolic panel with potassium 3.3, AST 57, ALT 45.  Anion gap 8.  Urinalysis shows nitrate positive urine, leukocytes.  Patient Foley catheter bag was changed prior to collecting urine sample.  Will culture patient urine.  Her lactic acid is elevated at 3.6.  Her ammonia is 20.  Her VBG reveals a venous pH of 55, HCO3 of 30.9.  Viral panel is negative for all.  BNP is within normal limits at 46.7.  PT/INR within normal limits.  Chest x-ray shows possible consolidation concerning for pneumonia.  CT head unremarkable.  CTA reveals no evidence of PE however does show concern for aspiration pneumonia.  Patient started on broad-spectrum antibiotics to include Rocephin , metronidazole , azithromycin  at this time.  Total amount of fluid given 1.5 L.  Received Tylenol  suppository due to being febrile.  Patient blood pressure has remained low here in the department.  She has a history of taking midodrine  in her chart however husband reports she has not taken this medication in over 1 year.  She was given Solu-Cortef  100 mg however remained hypotensive.  Patient requires admission due to hypoxia, aspiration pneumonia, sepsis,  UTI.  Have discussed with Dr. Charon Copper  critical care service who has agreed to admit the patient.  Patient husband amenable to plan.    Final Clinical Impression(s) / ED Diagnoses Final diagnoses:  Altered mental status, unspecified altered mental status type  Aspiration pneumonia, unspecified aspiration pneumonia type, unspecified laterality, unspecified part of lung (HCC)  Urinary tract infection associated with indwelling urethral catheter, initial encounter (HCC)  Sepsis, due to unspecified organism, unspecified whether acute organ dysfunction present [facility])    Rx / DC Orders ED Discharge Orders     None         Kristin Peyer 01/14/24 0517    Kelsey Patricia, MD 01/15/24 [phone]

## 2024-01-15 DIAGNOSIS — A419 Sepsis, unspecified organism: Principal | ICD-10-CM

## 2024-01-15 DIAGNOSIS — N182 Chronic kidney disease, stage 2 (mild): Secondary | ICD-10-CM

## 2024-01-15 DIAGNOSIS — R6521 Severe sepsis with septic shock: Secondary | ICD-10-CM

## 2024-01-15 DIAGNOSIS — R197 Diarrhea, unspecified: Secondary | ICD-10-CM

## 2024-01-15 DIAGNOSIS — J690 Pneumonitis due to inhalation of food and vomit: Secondary | ICD-10-CM

## 2024-01-15 DIAGNOSIS — J9601 Acute respiratory failure with hypoxia: Secondary | ICD-10-CM

## 2024-01-15 LAB — C DIFFICILE QUICK SCREEN W PCR REFLEX
C Diff antigen: NEGATIVE
C Diff interpretation: NOT DETECTED
C Diff toxin: NEGATIVE

## 2024-01-15 LAB — BLOOD CULTURE ID PANEL (REFLEXED) - BCID2

## 2024-01-15 LAB — URINE CULTURE

## 2024-01-15 MED ORDER — ALBUMIN HUMAN 25 % IV SOLN
25.0000 g | Freq: Four times a day (QID) | INTRAVENOUS | Status: AC
  Administered 2024-01-15 (×3): 25 g via INTRAVENOUS
  Administered 2024-01-16: 12.5 g via INTRAVENOUS
  Filled 2024-01-15 (×4): qty 100

## 2024-01-15 MED ORDER — ACETAMINOPHEN 325 MG PO TABS
650.0000 mg | ORAL_TABLET | ORAL | Status: DC | PRN
  Administered 2024-01-15: 650 mg via ORAL
  Filled 2024-01-15: qty 2

## 2024-01-15 MED ORDER — MIDODRINE HCL 5 MG PO TABS
10.0000 mg | ORAL_TABLET | Freq: Three times a day (TID) | ORAL | Status: DC
  Administered 2024-01-15 – 2024-01-16 (×5): 10 mg via ORAL
  Filled 2024-01-15 (×5): qty 2

## 2024-01-15 NOTE — Progress Notes (Signed)
 NAME:  Katelyn Lamb, MRN:  [id number], DOB:  01/05/1946, LOS: 1 ADMISSION DATE:  01/13/2024 CONSULTATION DATE:  01/14/2024 REFERRING MD:  Monique Ano - EDP, CHIEF COMPLAINT:  AMS, hypotension   History of Present Illness:  78 year old woman who presented to [facility] ED 5/12 for AMS, cool/clammy extremities after new medication (liquid opium ). Of note, patient is a hospice patient. PMHx significant for encephalomyelitis, incomplete paraplegia, seizure disorder, chronic pain syndrome, neurogenic bladder s/p suprapubic catheter placement, history of ESBL infection, DVT/PE, CKD, anxiety.  Patient's husband reported that patient had just been started on liquid opium  for diarrhea one day PTA. She was in her USOH the morning of admission until around 1500 when he came to check on her after giving her time to nap and noted that she was "gagging on her saliva" and "staring off into space". He called EMS after she was not responding appropriately to him.   On ED arrival, patient was noted to have LG fever to 100.27F, tachycardia to 130s, SBP 90s (palpated), SpO2 75% on RA. Foley was noted to be draining dark urine and 500mL LR bolus given. Labs were notable for WBC 20.2, Hgb 18.2, Plt 208. Na 139, K 4.1, CO2 30, BUN/Cr 20/0.84. Mildly elevated transaminases. Ammonia 20. LA 3.6 > 3.2. UA turbid with small Hgb, ++protein, +nitrite, +leuks. COVID/Flu/RSV negative. VBG pH 7.32/pCO2 60/bicarb 30.9. BCx and UCx pending. CXR with R infrahilar opacity. CT Head NAICA, CTA Chest negative for PE with resolution of prior PEs, chronic elevated R hemidiaphragm, multilobar R lung airspace disease c/w PNA vs. aspiration. Patient was started on broad-spectrum antibiotics and planned for [facility] admission.  Unfortunately, BPs remained marginal and PCCM consulted for urosepsis with possible need for vasopressor initiation.  Lastly pt was recently started on a new medication for her chronic diarrhea that she started approximately 36-48 hours ago,  they are unclear of medication name but states it is a liquid she takes 3 times a day, has had total of 5 doses when she had this episode as well. State that it has "opium  and alcohol" in it. Will need to investigate this further. Pt endorses she can barely keep her eyes open now when she takes it.   Pertinent Medical History:   Past Medical History:  Diagnosis Date   Acute deep vein thrombosis (DVT) of popliteal vein of left lower extremity (HCC)    Anxiety    Back pain    Chronic kidney disease    Encephalomyelitis    Gait abnormality 11/14/2016   Memory difficulty 03/02/2019   Myelitis due to herpes simplex [facility])    Neurogenic bladder    Significant Hospital Events: Including procedures, antibiotic start and stop dates in addition to other pertinent events   5/12 - Presented to [facility] ED for AMS, hypotension. C/f urosepsis. 5/13 - PCCM consulted for marginal BP, possible pressors. 5/14 No acute issues overnight, ongoing attempts as weaning vassopressors, she is alert and interactive this am   Interim History / Subjective:  States she feels well overall with no acute complaints   Objective:   Blood pressure 109/76, pulse 71, temperature (!) 97.5 F (36.4 C), temperature source Oral, resp. rate (!) 26, height 5\' 2"  (1.575 m), weight 72.1 kg, SpO2 98%.        Intake/Output Summary (Last 24 hours) at 01/15/2024 0953 Last data filed at 01/15/2024 0924 Gross per 24 hour  Intake 2081.78 ml  Output 2250 ml  Net -168.22 ml   [employer]  01/14/24 0756 01/15/24 0500  Weight: 72.2 kg 72.1 kg    Physical Examination: General: Acute on chronically ill appearing elderly female sitting up in bed, in NAD HEENT: [place]/AT, MM pink/moist, PERRL,  Neuro: Alert and oriented x2, confused to situation  CV: s1s2 regular rate and rhythm, no murmur, rubs, or gallops,  PULM:  Clear to auscultation, no increased work of breathing, no added breath sounds  GI: soft, bowel sounds active in all 4  quadrants, non-tender, non-distended oral diet  Extremities: warm/dry, no edema  Skin: no rashes or lesions     Resolved Hospital Problem List:  None   Assessment & Plan:  Septic shock, RLL PNA. Low suspicion for UTI given that the urine was taken from chronic suprapubic catheter. History of ESBL infection -Family does not want invasive procedures (Aline, CVC) or high doses of vasopressors> see ipal notes.  P: Continue peripheral pressor for MAP goal > 60 Schedule albumin  Start Midodrine   Remains on Ceftriaxone  and Doxy  Follow cultures  Aspiration precautions  Minimize sedation as able   Neurogenic bladder s/p suprapubic catheter placement -Per Care Everywhere catheter is exchanged monthly per [facility] RN P: Antibiotics as above   Chronic diarrhea -Has failed imodium and is on opium  at home per family -C-diff negative  P: Continue supportive care   Acute encephalopathy likely due to sepsis vs medication side effect Encephalomyelitis- chronic due to previous HSV - Improved  Incomplete paraplegia; bedbound at baseline Seizure disorder (in chart but pt and husband deny) Chronic diarrhea P: Minimize sedating medication as able  Aspirations precautions  Continue home Lyrica , Baclofen , and Valium    Chronic pain syndrome P: Continue home Norco, Lyrica . Baclofen , and Valium    DVT & PE in 2022; high risk with being bedbound P: Confirm with spouse use of Eliquis  resume if active medication   CKD 2 P: Follow renal function  Monitor urine output Trend Bmet Avoid nephrotoxins Ensure adequate renal perfusion   Anxiety P: Contine PTA Valium    GOC Hospice status- [facility] -See separate Ipal note from today  Best Practice: (right click and "Reselect all SmartList Selections" daily)   Diet/type: NPO w/ oral meds> advance as tolerated if she passes bedside swallow DVT prophylaxis: DOAC GI prophylaxis: PPI Lines: N/A Foley:  Yes, and it is still needed-- chronic  suprapubic catheter Code Status:  DNR- comfort care only in the case of an arrest Last date of multidisciplinary goals of care discussion [multiple updates during the day 5/13 ]  Critical care time:  CRITICAL CARE Performed by: Zachari Alberta D. Harris   Total critical care time: 35 minutes  Critical care time was exclusive of separately billable procedures and treating other patients.  Critical care was necessary to treat or prevent imminent or life-threatening deterioration.  Critical care was time spent personally by me on the following activities: development of treatment plan with patient and/or surrogate as well as nursing, discussions with consultants, evaluation of patient's response to treatment, examination of patient, obtaining history from patient or surrogate, ordering and performing treatments and interventions, ordering and review of laboratory studies, ordering and review of radiographic studies, pulse oximetry and re-evaluation of patient's condition.  Lyn Deemer D. Harris, NP-C [facility] Personal contact information can be found on Amion  If no contact or response made please call [phone] 01/15/2024, 9:56 AM

## 2024-01-15 NOTE — Progress Notes (Signed)
 PHARMACY - PHYSICIAN COMMUNICATION CRITICAL VALUE ALERT - BLOOD CULTURE IDENTIFICATION (BCID)  Katelyn Lamb is an 78 y.o. female who presented to [facility] on 01/13/2024 with a chief complaint of altered mental status.  Assessment:   Admit with septic shock, aspiration PNA. Anaerobic bottle of 1 blood cx set + GPCC; BCID + CoNS (S. Epi) - possible contaminant.  MRSA PCR negative.   Name of physician (or Provider) Contacted: Dr Fulton Job  Current antibiotics: Rocephin  + Doxycycline  Changes to prescribed antibiotics recommended:  Continue current antibiotics and monitor cx data.  Results for orders placed or performed during the hospital encounter of 01/13/24  Blood Culture ID Panel (Reflexed) (Collected: 01/14/2024 12:45 AM)  Result Value Ref Range   Enterococcus faecalis NOT DETECTED NOT DETECTED   Enterococcus Faecium NOT DETECTED NOT DETECTED   Listeria monocytogenes NOT DETECTED NOT DETECTED   Staphylococcus species DETECTED (A) NOT DETECTED   Staphylococcus aureus (BCID) NOT DETECTED NOT DETECTED   Staphylococcus epidermidis DETECTED (A) NOT DETECTED   Staphylococcus lugdunensis NOT DETECTED NOT DETECTED   Streptococcus species NOT DETECTED NOT DETECTED   Streptococcus agalactiae NOT DETECTED NOT DETECTED   Streptococcus pneumoniae NOT DETECTED NOT DETECTED   Streptococcus pyogenes NOT DETECTED NOT DETECTED   A.calcoaceticus-baumannii NOT DETECTED NOT DETECTED   Bacteroides fragilis NOT DETECTED NOT DETECTED   Enterobacterales NOT DETECTED NOT DETECTED   Enterobacter cloacae complex NOT DETECTED NOT DETECTED   Escherichia coli NOT DETECTED NOT DETECTED   Klebsiella aerogenes NOT DETECTED NOT DETECTED   Klebsiella oxytoca NOT DETECTED NOT DETECTED   Klebsiella pneumoniae NOT DETECTED NOT DETECTED   Proteus species NOT DETECTED NOT DETECTED   Salmonella species NOT DETECTED NOT DETECTED   Serratia marcescens NOT DETECTED NOT DETECTED   Haemophilus influenzae NOT DETECTED NOT  DETECTED   Neisseria meningitidis NOT DETECTED NOT DETECTED   Pseudomonas aeruginosa NOT DETECTED NOT DETECTED   Stenotrophomonas maltophilia NOT DETECTED NOT DETECTED   Candida albicans NOT DETECTED NOT DETECTED   Candida auris NOT DETECTED NOT DETECTED   Candida glabrata NOT DETECTED NOT DETECTED   Candida krusei NOT DETECTED NOT DETECTED   Candida parapsilosis NOT DETECTED NOT DETECTED   Candida tropicalis NOT DETECTED NOT DETECTED   Cryptococcus neoformans/gattii NOT DETECTED NOT DETECTED   Methicillin resistance mecA/C DETECTED (A) NOT DETECTED    Arie Kurtz PharmD 01/15/2024  2:05 AM

## 2024-01-15 NOTE — Progress Notes (Signed)
 [facility] [address] [facility] Liaison Note?     Ms. 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 On the evening of 5.12, patient's husband called [facility] to report that patient became non-responsive, cold and clammy and her O2 sats dropped to the 70's. Nurse visit was offered but due to low O2 sats and no oxygen  in the home, EMS was called to assist and took her to the ED. Patient was admitted on 5.13 with concerns for hypoxia, aspiration pneumonia, sepsis and UTI. Per Dr. Tessie Fila with [facility] this is a related hospital admission.     Visited with patient, patient's daughter and family friend at bedside. Report exchanged with hospital team. Patient much more alert and conversational today. She voiced that she did not want to come back to the hospital. Attempts being made to wean vasopressors with MAP goal > 60.    Patient is inpatient appropriate due to need for IV medications and antibiotics.    Vital Signs:?97.9/88/25    96/47    99% on 10LPM HFNC   I/O:  2102.10/1823   Abnormal labs: none new  Diagnostics: none new IV/PRN Meds:   Levophed  IV titrated, LR 500 mL bolus IV x 3, LR IV 75 mL/hr cont,  Doxycycline 100 mg IV x4, Albumin 25g IV x1   Problem list per MD note Enrico Hartshorn, MD 5.14.25:  Assessment & Plan: Septic shock, RLL PNA. Low suspicion for UTI given that the urine was taken from chronic suprapubic catheter. History of ESBL infection -Family does not want invasive procedures (Aline, CVC) or high doses of vasopressors> see ipal notes.  P: Continue peripheral pressor for MAP goal > 60 Schedule albumin  Start Midodrine   Remains on Ceftriaxone  and Doxy  Follow cultures  Aspiration precautions  Minimize sedation as able    Neurogenic bladder s/p suprapubic catheter placement -Per Care Everywhere catheter is exchanged monthly per [facility] RN P: Antibiotics as above     Chronic diarrhea -Has failed imodium and is on opium  at home per family -C-diff negative  P: Continue supportive care    Acute encephalopathy likely due to sepsis vs medication side effect Encephalomyelitis- chronic due to previous HSV - Improved  Incomplete paraplegia; bedbound at baseline Seizure disorder (in chart but pt and husband deny) Chronic diarrhea P: Minimize sedating medication as able  Aspirations precautions  Continue home Lyrica , Baclofen , and Valium     Chronic pain syndrome P: Continue home Norco, Lyrica . Baclofen , and Valium     DVT & PE in 2022; high risk with being bedbound P: Confirm with spouse use of Eliquis  resume if active medication    CKD 2 P: Follow renal function  Monitor urine output Trend Bmet Avoid nephrotoxins Ensure adequate renal perfusion    Anxiety P: Contine PTA Valium      Discharge Planning:? Discharge back home if medically stable   Family contact: spoke with daughter at bedside   IDT:? Updated?     Goals of Care: DNR- comfort care only in the case of an arrest   If patient requires EMS transport at discharge, please us  [facility] as that is who [facility] is contracted with for transport.   Please call with any hospice related questions or concerns.   Ardine Beckwith, LPN [facility] Liaison [phone]

## 2024-01-15 NOTE — Plan of Care (Incomplete)

## 2024-01-16 ENCOUNTER — Other Ambulatory Visit (HOSPITAL_COMMUNITY): Payer: Self-pay

## 2024-01-16 ENCOUNTER — Other Ambulatory Visit: Payer: Self-pay

## 2024-01-16 DIAGNOSIS — J9601 Acute respiratory failure with hypoxia: Secondary | ICD-10-CM

## 2024-01-16 DIAGNOSIS — J690 Pneumonitis due to inhalation of food and vomit: Secondary | ICD-10-CM

## 2024-01-16 DIAGNOSIS — R197 Diarrhea, unspecified: Secondary | ICD-10-CM

## 2024-01-16 DIAGNOSIS — A419 Sepsis, unspecified organism: Principal | ICD-10-CM

## 2024-01-16 DIAGNOSIS — R6521 Severe sepsis with septic shock: Secondary | ICD-10-CM

## 2024-01-16 DIAGNOSIS — N182 Chronic kidney disease, stage 2 (mild): Secondary | ICD-10-CM

## 2024-01-16 LAB — GASTROINTESTINAL PANEL BY PCR, STOOL (REPLACES STOOL CULTURE)

## 2024-01-16 MED ORDER — AZITHROMYCIN 500 MG PO TABS
500.0000 mg | ORAL_TABLET | Freq: Every day | ORAL | 0 refills | Status: AC
  Filled 2024-01-16: qty 3, 3d supply, fill #0

## 2024-01-16 MED ORDER — MIDODRINE HCL 10 MG PO TABS
10.0000 mg | ORAL_TABLET | Freq: Three times a day (TID) | ORAL | 0 refills | Status: AC
  Filled 2024-01-16: qty 90, 30d supply, fill #0

## 2024-01-16 MED ORDER — AMOXICILLIN-POT CLAVULANATE 875-125 MG PO TABS
1.0000 | ORAL_TABLET | Freq: Two times a day (BID) | ORAL | 0 refills | Status: DC
  Filled 2024-01-16: qty 5, 3d supply, fill #0

## 2024-01-16 NOTE — Progress Notes (Signed)
 PCCM Progress Note   Prior to discharge patient was weaned off supplemental oxygen  to RA with oxygen  saturations. 90-92%. Therefore patient does not meet criteria for home oxygen  at this time. Additionally family has again elected for more of a comfort approach.   Stephaie Dardis D. Harris, NP-C [facility] Personal contact information can be found on Amion  If no contact or response made please call [phone] 01/16/2024, 1:36 PM

## 2024-01-16 NOTE — Discharge Summary (Addendum)
 Physician Discharge Summary       Patient ID: Katelyn Lamb MRN: [id number] DOB/AGE: 1946/04/08 78 y.o.  Admit date: 01/13/2024 Discharge date: 01/16/2024  Discharge Diagnoses:   Septic shock, RLL PNA. Low suspicion for UTI given that the urine was taken from chronic suprapubic catheter. History of ESBL infection Neurogenic bladder s/p suprapubic catheter placement Chronic diarrhea Acute encephalopathy likely due to sepsis vs medication side effect Encephalomyelitis- chronic due to previous HSV - Improved  Incomplete paraplegia; bedbound at baseline Seizure disorder (in chart but pt and husband deny) Chronic diarrhea Chronic pain syndrome DVT & PE in 2022; high risk with being bedbound CKD 2 Anxiety  Discharge summary   Katelyn Lamb is a 78 y/o woman with a history of encephalomyelitis, incomplete paraplegia, ESBL infection, chronic suprapubic catheter for urinary retention, DVT and PE who presented with nonresponsiveness and hypoxia. She was found to have septic shock due to aspiration pneumonia.During admission she was treated with IV antibiotics and low dose peripheral vasopressor support. With these interventions shock state quickly resolved and mentation returned to baseline. GOC discussion was held and family confirmed that conservative measures with underlying hospice care was the main goal moving forward. By 5/15 patient was back to baseline mentation and family was comfortable discharging home with short oral antibiotic course.   Discharge Plan by Active Problems   Septic shock, RLL PNA. Low suspicion for UTI given that the urine was taken from chronic suprapubic catheter. History of ESBL infection -Family does not want invasive procedures (Aline, CVC) or high doses of vasopressors> see ipal notes.  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 Baclofen , and Valium  upon discharge    DVT & PE in 2022; high risk with being bedbound P: Confirm with spouse use of Eliquis  resume if active medication    CKD 2 P: Encourage oral hydration    Anxiety P: Valium  upon discharge    GOC Hospice status- [facility]   Significant Hospital tests/ studies  5/12 - Presented to [facility] ED for AMS, hypotension. C/f urosepsis. 5/13 - PCCM consulted for marginal BP, possible pressors. 5/14 No acute issues overnight, ongoing attempts as weaning vassopressors, she is alert and interactive this am 5/15 Discharged home   Procedures   None   Culture data/antimicrobials   1/4 blood culture positive for staph hominis felt to be a contaminate    Consults      Discharge Exam: BP 111/67   Pulse 68   Temp 97.6 F (36.4 C) (Oral)   Resp (!) 22   Ht 5\' 2"  (1.575 m)   Wt 72.7 kg   SpO2 98%   BMI 29.31 kg/m   General: Well but chronic ill appearing elderly female sitting up in bed, in NAD HEENT: [place]/AT, MM pink/moist, PERRL,  Neuro: Alert  and oriented x3, non-focal  CV: s1s2 regular rate and rhythm, no murmur, rubs, or gallops,  PULM:  Clear to auscultation, no increased work of breathing, no added breath sounds  GI: soft, bowel sounds active in all 4 quadrants, non-tender, non-distended, tolerating oral deit Extremities: warm/dry, no edema  Skin: no  rashes or lesions  Labs at discharge   Lab Results  Component Value Date   CREATININE 0.74 01/14/2024   BUN 17 01/14/2024   NA 138 01/14/2024   K 3.3 (L) 01/14/2024   CL 104 01/14/2024   CO2 26 01/14/2024   Lab Results  Component Value Date   WBC 20.2 (H) 01/13/2024   HGB 18.2 (H) 01/13/2024   HCT 58.5 (H) 01/13/2024   MCV 104.5 (H) 01/13/2024   PLT 208 01/13/2024   Lab Results  Component Value Date   ALT 45 (H) 01/14/2024   AST 57 (H) 01/14/2024   ALKPHOS 68 01/14/2024   BILITOT 1.0 01/14/2024   Lab Results  Component Value Date   INR 1.1 01/14/2024   INR 1.1 06/28/2022   INR 1.5 (H) 06/26/2022    Current radiological studies    No results found.  Disposition:    Discharge disposition: 01-Home or Self Care         Allergies as of 01/16/2024       Reactions   Demerol [meperidine] Other (See Comments)   Hallucinations   Hydrocodone -acetaminophen  Other (See Comments)   Percocet [oxycodone -acetaminophen ] Itching   Lamotrigine  Rash   Penicillins Itching, Rash   Tolerated amoxicillin  November 2022  Has patient had a PCN reaction causing immediate rash, facial/tongue/throat swelling, SOB or lightheadedness with hypotension:  NO Has patient had a PCN reaction causing severe rash involving mucus membranes or skin necrosis: No Has patient had a PCN reaction that required hospitalization: No Has patient had a PCN reaction occurring within the last 10 years: Yes If all of the above answers are "NO", then may proceed with Cephalosporin use.        Medication List     STOP taking these medications    apixaban  5 MG Tabs tablet Commonly known as: ELIQUIS        TAKE these medications    acyclovir  400 MG tablet Commonly known as: ZOVIRAX  TAKE 1 TABLET BY MOUTH TWICE A DAY   albuterol  (2.5 MG/3ML) 0.083% nebulizer solution Commonly known as: PROVENTIL  Take 3 mLs (2.5 mg total) by nebulization every 2 (two) hours as needed for wheezing or shortness  of breath.   aspirin EC 325 MG tablet Take 325 mg by mouth daily.   azithromycin  500 MG tablet Commonly known as: Zithromax  Take 1 tablet (500 mg total) by mouth daily for 3 days.   Baclofen  5 MG Tabs Take 5 mg by mouth 3 (three) times daily.   Calcium  600+D3 600-5 MG-MCG Tabs Generic drug: Calcium  Carb-Cholecalciferol  Take 1 tablet by mouth daily at 12 noon.   cholestyramine 4 GM/DOSE powder Commonly known as: QUESTRAN Take 4 g by mouth 2 (two) times daily.   cyanocobalamin  1000 MCG tablet Commonly known as: VITAMIN B12 Take 1,000 mcg by mouth daily.   diazepam  5 MG tablet Commonly known as: VALIUM  TAKE 1 TABLET (5 MG TOTAL) BY MOUTH EVERY 12 (TWELVE) HOURS AS NEEDED (FOR TREMORS). What changed: reasons to take this   diclofenac  Sodium 1 % Gel Commonly known as: VOLTAREN  Apply 2 g topically 4 (four) times daily as needed (pain).   diphenoxylate -atropine  2.5-0.025 MG tablet Commonly known as:  LOMOTIL  Take 1 tablet by mouth 2 (two) times daily as needed for diarrhea or loose stools.   docusate sodium  100 MG capsule Commonly known as: COLACE Take 200 mg by mouth daily.   DULoxetine  60 MG capsule Commonly known as: CYMBALTA  TAKE 1 CAPSULE (60 MG TOTAL) BY MOUTH AT BEDTIME.   ferrous sulfate  325 (65 FE) MG tablet Take 325 mg by mouth every morning.   fluticasone -salmeterol 100-50 MCG/ACT Aepb Commonly known as: ADVAIR Inhale 1 puff into the lungs 2 (two) times daily.   HYDROcodone -acetaminophen  10-325 MG tablet Commonly known as: NORCO Take 1 tablet by mouth every 6 (six) hours as needed for moderate pain (pain score 4-6).   lactobacillus acidophilus Tabs tablet Take 1 tablet by mouth daily at 12 noon.   LORazepam 0.5 MG tablet Commonly known as: ATIVAN Take 0.5 mg by mouth every 4 (four) hours as needed for anxiety, sedation or sleep.   Melatonin Maximum Strength 5 MG Tabs Generic drug: melatonin Take 5 mg by mouth at bedtime.   midodrine  10 MG  tablet Commonly known as: PROAMATINE  Take 1 tablet (10 mg total) by mouth 3 (three) times daily with meals.   mirtazapine 30 MG tablet Commonly known as: REMERON Take 30 mg by mouth at bedtime.   Myrbetriq  50 MG Tb24 tablet Generic drug: mirabegron  ER Take 50 mg by mouth daily.   omega-3 acid ethyl esters 1 g capsule Commonly known as: LOVAZA  Take 1 g by mouth every morning.   Opium  10 MG/ML (1%) Tinc Take 0.6 mLs by mouth in the morning, at noon, in the evening, and at bedtime.   pantoprazole  40 MG tablet Commonly known as: PROTONIX  Take 1 tablet (40 mg total) by mouth daily. What changed: when to take this   predniSONE  5 MG tablet Commonly known as: DELTASONE  TAKE 1 TABLET BY MOUTH EVERY DAY WITH BREAKFAST What changed: See the new instructions.   pregabalin  50 MG capsule Commonly known as: LYRICA  TAKE 1 CAPSULE BY MOUTH TWICE A DAY   temazepam 15 MG capsule Commonly known as: RESTORIL Take 15 mg by mouth at bedtime as needed for sleep.   traZODone  50 MG tablet Commonly known as: DESYREL  TAKE 1 TABLET BY MOUTH EVERYDAY AT BEDTIME What changed: See the new instructions.   Vitamin C 500 MG Chew Chew 500 mg by mouth daily.         Follow-up appointment   None  Discharge Condition:    stable  Camisha Srey D. Harris, NP-C [facility] Personal contact information can be found on Amion  If no contact or response made please call [phone] 01/16/2024, 4:31 PM

## 2024-01-16 NOTE — TOC Initial Note (Signed)
 Transition of Care [facility]) - Initial/Assessment Note    Patient Details  Name: Katelyn Lamb MRN: [id number] Date of Birth: October 15, 1945  Transition of Care [facility]) CM/SW Contact:    Levie Ream, RN Phone Number: 01/16/2024, 11:29 AM  Clinical Narrative:                 Spoke w/ pt, husband Stephen Postlewait [phone]), and family in room; pt says she lives at home w/ her husband; she plans to return at d/c; pt will need transport by ambulance; pt verified PCP/insurance; she denied SDOH risks; pt says she is bed-bound, her husband says they would told pt will need home oxygen ; pt is also active with [facility] hospice; TOC is following.   Expected Discharge Plan: Home w Hospice Care Barriers to Discharge: Continued Medical Work up   Patient Goals and CMS Choice Patient states their goals for this hospitalization and ongoing recovery are:: home CMS Medicare.gov Compare Post Acute Care list provided to:: Patient Choice offered to / list presented to : Patient      Expected Discharge Plan and Services   Discharge Planning Services: CM Consult   Living arrangements for the past 2 months: Single Family Home                                      Prior Living Arrangements/Services Living arrangements for the past 2 months: Single Family Home Lives with:: Spouse Patient language and need for interpreter reviewed:: Yes Do you feel safe going back to the place where you live?: Yes      Need for Family Participation in Patient Care: Yes (Comment) Care giver support system in place?: Yes (comment)   Criminal Activity/Legal Involvement Pertinent to Current Situation/Hospitalization: No - Comment as needed  Activities of Daily Living   ADL Screening (condition at time of admission) Independently performs ADLs?: No Does the patient have a NEW difficulty with bathing/dressing/toileting/self-feeding that is expected to last >3 days?: No Does the patient have a NEW  difficulty with getting in/out of bed, walking, or climbing stairs that is expected to last >3 days?: No Does the patient have a NEW difficulty with communication that is expected to last >3 days?: No Is the patient deaf or have difficulty hearing?: Yes Does the patient have difficulty seeing, even when wearing glasses/contacts?: No Does the patient have difficulty concentrating, remembering, or making decisions?: No  Permission Sought/Granted Permission sought to share information with : Case Manager Permission granted to share information with : Yes, Verbal Permission Granted  Share Information with NAME: Case Manger     Permission granted to share info w Relationship: Ziasia Vongphachanh (spouse) [phone]     Emotional Assessment Appearance:: Appears stated age Attitude/Demeanor/Rapport: Gracious Affect (typically observed): Accepting Orientation: : Oriented to Self, Oriented to Place, Oriented to  Time, Oriented to Situation Alcohol / Substance Use: Not Applicable Psych Involvement: No (comment)  Admission diagnosis:  Sepsis (HCC) [A41.9] Altered mental status, unspecified altered mental status type [R41.82] Urinary tract infection associated with indwelling urethral catheter, initial encounter (HCC) [K44.010U, N39.0] Aspiration pneumonia, unspecified aspiration pneumonia type, unspecified laterality, unspecified part of lung (HCC) [J69.0] Sepsis, due to unspecified organism, unspecified whether acute organ dysfunction present [facility]) [A41.9] Patient Active Problem List   Diagnosis Date Noted   Sepsis (HCC) 01/14/2024   Aspiration pneumonia (HCC) 01/14/2024   Septic shock (HCC) 01/14/2024   Advanced care planning/counseling  discussion 01/14/2024   Cough variant asthma 02/15/2023   Change in bowel habits 02/11/2023   Diarrhea, unspecified 02/11/2023   History of colonic polyps 02/11/2023   Periumbilical pain 02/11/2023   Right lower quadrant pain 02/11/2023   Chronic idiopathic  constipation 08/01/2022   Diverticulosis of colon 08/01/2022   Hemorrhagic shock (HCC) 06/28/2022   Gross hematuria 06/27/2022   Hyperglycemia 06/27/2022   Complicated UTI (urinary tract infection) 06/27/2022   Bandemia 06/27/2022   Hematuria due to acute cystitis 06/26/2022   History of DVT (deep vein thrombosis) 06/26/2022   Current chronic use of systemic steroids 06/26/2022   Age related osteoporosis 11/09/2021   Moderate recurrent major depression (HCC) 11/09/2021   Primary insomnia 11/09/2021   Pure hypercholesterolemia 11/09/2021   Weakness of left upper extremity 09/15/2021   Chronic suprapubic catheter (HCC) 09/15/2021   History of pulmonary embolism 09/15/2021   Abdominal pain 08/07/2021   Elevated troponin 08/07/2021   Lactic acidosis 08/07/2021   Seizure disorder (HCC) 08/07/2021   GERD (gastroesophageal reflux disease) 08/07/2021   Platelet inhibition due to Plavix    Epistaxis s/p embolization 07-18-2021 07/17/2021   Asymptomatic bacteriuria    History of ESBL E. coli infection 07/14/2021   History of herpes encephalitis 07/14/2021   Generalized weakness 03/15/2021   Acute cystitis with hematuria 03/15/2021   Encephalopathy 03/15/2021   Recurrent falls 03/15/2021   Acquired thrombophilia (HCC) 04/11/2020   Right pulmonary embolus (HCC) 02/12/2020   Lower extremity weakness 02/02/2020   Macrocytic anemia 02/02/2020   Spinal stenosis 02/02/2020   History of recurrent UTI (urinary tract infection)    Bacterial infection due to Klebsiella pneumoniae    Hypoxemia 12/09/2019   Laceration of left hand 12/09/2019   DNR (do not resuscitate) 12/09/2019   Pelvic fracture (HCC) 12/06/2019   AKI (acute kidney injury) (HCC) 12/06/2019   Urinary tract infection associated with catheterization of urinary tract, initial encounter (HCC) 07/23/2019   Hematuria 07/23/2019   Hypokalemia 07/23/2019   Acute on chronic anemia 07/23/2019   Fever 07/23/2019   Leukocytosis  07/23/2019   Generalized anxiety disorder 06/17/2019   Menopausal sweats 06/17/2019   Memory difficulty 03/02/2019   Degenerative spondylolisthesis 09/02/2018   Delirium 03/26/2018   Severe sepsis with septic shock (HCC) 03/26/2018   Degeneration of lumbar intervertebral disc 11/08/2017   Lumbar radiculopathy 11/06/2017   Chronic neck pain 09/12/2017   Chronic low back pain 09/06/2017   Chronic pain syndrome 09/06/2017   Dilated pancreatic duct 01/24/2017   DVT, lower extremity, distal, chronic (HCC) 01/22/2017   Elevated serum GGT level 01/22/2017   Medication monitoring encounter 01/08/2017   Gait abnormality 11/14/2016   Urinary retention    Anxiety about health    Reactive depression    Ataxia    Abdominal spasms    Constipation due to pain medication    Acute lower UTI    Dysuria    Incomplete paraplegia (HCC)    Acute blood loss anemia    Neurogenic bladder    Neuropathic pain    Muscle spasm    Gastroesophageal reflux disease    Slow transit constipation    Thrombocytopenia (HCC) 10/03/2016   Abnormal MRI, spinal cord    Encephalomyelitis    Numbness    Intractable back pain 09/20/2016   Numbness of left lower extremity 09/20/2016   Hyponatremia 09/20/2016   Herpes zoster without complication 09/20/2016   Spondylosis of cervical region without myelopathy or radiculopathy 06/16/2015   PCP:  Lanae Pinal, MD  Pharmacy:   [id number] - [place], [place] - [address] AT [address] [facility] [address] [place] [address] Phone: [phone] Fax: [phone]  [facility] [address] [place] [address] Phone: [phone] Fax: [phone]     Social Drivers of Health (SDOH) Social History: SDOH Screenings   Food Insecurity: No Food Insecurity (01/16/2024)  Housing: Low Risk  (01/16/2024)  Transportation Needs: No Transportation Needs (01/16/2024)  Utilities: Not At Risk  (01/16/2024)  Social Connections: Moderately Isolated (01/14/2024)  Tobacco Use: Medium Risk (01/13/2024)   SDOH Interventions: Food Insecurity Interventions: Intervention Not Indicated, Inpatient TOC Housing Interventions: Intervention Not Indicated, Inpatient TOC Transportation Interventions: Intervention Not Indicated, Inpatient TOC Utilities Interventions: Intervention Not Indicated, Inpatient TOC   Readmission Risk Interventions    01/16/2024   11:26 AM 06/29/2022    1:54 PM  Readmission Risk Prevention Plan  Transportation Screening Complete Complete  PCP or Specialist Appt within 5-7 Days Complete   Home Care Screening Complete   Medication Review (RN CM) Complete   Medication Review (RN Care Manager)  Complete  HRI or Home Care Consult  Complete  SW Recovery Care/Counseling Consult  Complete  Palliative Care Screening  Not Applicable  Skilled Nursing Facility  Not Applicable

## 2024-01-16 NOTE — TOC Transition Note (Signed)
 Transition of Care [facility]) - Discharge Note   Patient Details  Name: Katelyn Lamb MRN: [id number] Date of Birth: Jun 02, 1946  Transition of Care [facility]) CM/SW Contact:  Levie Ream, RN Phone Number: 01/16/2024, 2:07 PM   Clinical Narrative:    D/C orders received; pt verified d/c address: [address] [place], [place] [address]; pt transport by Lyna Sandhoff; per Odilia Bennett, RN meds to be delivered to room at 1430; will call [facility].  -1445- meds delivered; [facility] called at 1446; spoke w/ Parne; no TOC.   Final next level of care: Home w Hospice Care Barriers to Discharge: No Barriers Identified   Patient Goals and CMS Choice Patient states their goals for this hospitalization and ongoing recovery are:: home CMS Medicare.gov Compare Post Acute Care list provided to:: Patient Choice offered to / list presented to : Patient      Discharge Placement                       Discharge Plan and Services Additional resources added to the After Visit Summary for     Discharge Planning Services: CM Consult                                 Social Drivers of Health (SDOH) Interventions SDOH Screenings   Food Insecurity: No Food Insecurity (01/16/2024)  Housing: Low Risk  (01/16/2024)  Transportation Needs: No Transportation Needs (01/16/2024)  Utilities: Not At Risk (01/16/2024)  Social Connections: Moderately Isolated (01/14/2024)  Tobacco Use: Medium Risk (01/13/2024)     Readmission Risk Interventions    01/16/2024   11:26 AM 06/29/2022    1:54 PM  Readmission Risk Prevention Plan  Transportation Screening Complete Complete  PCP or Specialist Appt within 5-7 Days Complete   Home Care Screening Complete   Medication Review (RN CM) Complete   Medication Review (RN Care Manager)  Complete  HRI or Home Care Consult  Complete  SW Recovery Care/Counseling Consult  Complete  Palliative Care Screening  Not Applicable  Skilled Nursing Facility  Not Applicable

## 2024-01-18 LAB — CULTURE, BLOOD (ROUTINE X 2)

## 2024-01-19 LAB — CULTURE, BLOOD (ROUTINE X 2): Culture: NO GROWTH

## 2024-01-20 ENCOUNTER — Other Ambulatory Visit: Payer: Self-pay | Admitting: Neurology

## 2024-01-20 DIAGNOSIS — R269 Unspecified abnormalities of gait and mobility: Secondary | ICD-10-CM

## 2024-01-20 DIAGNOSIS — M792 Neuralgia and neuritis, unspecified: Secondary | ICD-10-CM

## 2024-01-20 DIAGNOSIS — G0490 Encephalitis and encephalomyelitis, unspecified: Secondary | ICD-10-CM

## 2024-01-22 ENCOUNTER — Other Ambulatory Visit: Payer: Self-pay | Admitting: Neurology

## 2024-01-22 NOTE — Telephone Encounter (Signed)
 Last seen 11/05/23 and next f/u 05/07/24. Last refilled 12/19/23 #60.

## 2024-01-24 DIAGNOSIS — R532 Functional quadriplegia: Secondary | ICD-10-CM | POA: Diagnosis not present

## 2024-01-24 DIAGNOSIS — G8222 Paraplegia, incomplete: Secondary | ICD-10-CM | POA: Diagnosis not present

## 2024-01-24 DIAGNOSIS — F331 Major depressive disorder, recurrent, moderate: Secondary | ICD-10-CM | POA: Diagnosis not present

## 2024-01-24 DIAGNOSIS — Z9350 Unspecified cystostomy status: Secondary | ICD-10-CM | POA: Diagnosis not present

## 2024-03-20 ENCOUNTER — Other Ambulatory Visit (HOSPITAL_COMMUNITY): Payer: Self-pay

## 2024-03-20 ENCOUNTER — Other Ambulatory Visit (HOSPITAL_BASED_OUTPATIENT_CLINIC_OR_DEPARTMENT_OTHER): Payer: Self-pay

## 2024-03-20 ENCOUNTER — Other Ambulatory Visit: Payer: Self-pay

## 2024-03-20 MED ORDER — OPIUM 10 MG/ML (1%) PO TINC
0.6000 mL | Freq: Four times a day (QID) | ORAL | 0 refills | Status: DC
  Filled 2024-03-20 (×2): qty 72, 30d supply, fill #0

## 2024-04-12 ENCOUNTER — Other Ambulatory Visit: Payer: Self-pay | Admitting: Neurology

## 2024-04-12 DIAGNOSIS — G0490 Encephalitis and encephalomyelitis, unspecified: Secondary | ICD-10-CM

## 2024-04-12 DIAGNOSIS — R269 Unspecified abnormalities of gait and mobility: Secondary | ICD-10-CM

## 2024-04-12 DIAGNOSIS — M792 Neuralgia and neuritis, unspecified: Secondary | ICD-10-CM

## 2024-04-13 ENCOUNTER — Other Ambulatory Visit: Payer: Self-pay

## 2024-04-30 ENCOUNTER — Telehealth: Payer: Self-pay | Admitting: Neurology

## 2024-04-30 NOTE — Telephone Encounter (Signed)
 Pt husband called to request vv , However Py husband cancel appt for that day will call back to rescheduled   Appt Canceled

## 2024-05-07 ENCOUNTER — Ambulatory Visit: Admitting: Neurology

## 2024-05-12 ENCOUNTER — Other Ambulatory Visit: Payer: Self-pay | Admitting: Neurology

## 2024-05-12 DIAGNOSIS — G9340 Encephalopathy, unspecified: Secondary | ICD-10-CM

## 2024-05-12 NOTE — Telephone Encounter (Signed)
 Last seen on 11/05/23 No 6 month up follow up scheduled   PREGABALIN  50 MG CAPSULE 04/10/2024 30 60 each Sater, Charlie LABOR, MD [facility] [phone] - G...   Rx pending to be signed

## 2024-05-21 ENCOUNTER — Other Ambulatory Visit: Payer: Self-pay | Admitting: Neurology

## 2024-05-21 NOTE — Telephone Encounter (Signed)
 Last seen on 11/05/23 per note Continue acyclovir  and 5 mg daily prednisone .. No 6 month follow up scheduled

## 2024-05-22 ENCOUNTER — Other Ambulatory Visit (HOSPITAL_COMMUNITY): Payer: Self-pay

## 2024-05-26 ENCOUNTER — Other Ambulatory Visit: Payer: Self-pay | Admitting: Neurology

## 2024-05-26 NOTE — Telephone Encounter (Signed)
 Requested Prescriptions   Pending Prescriptions Disp Refills   diazepam  (VALIUM ) 5 MG tablet [Pharmacy Med Name: DIAZEPAM  5 MG TABLET] 60 tablet     Sig: TAKE 1 TABLET BY MOUTH EVERY 12 HOURS AS NEEDED (FOR TREMORS).   Last seen 11/05/23 Next appt not scheduled  Dispenses   Dispensed Days Supply Quantity Provider Pharmacy  DIAZEPAM  5 MG TABLET 04/23/2024 30 60 each Dohmeier, Dedra, MD [facility] [phone] - G...  DIAZEPAM  5 MG TABLET 03/26/2024 30 60 each Dohmeier, Dedra, MD [facility] [phone] - G...  diazepam  5 mg tablet 03/19/2024 30  Hertweck, Nancyann, MD [facility] [phone] - G...  DIAZEPAM  5 MG TABLET 02/22/2024 30 60 each Hosie Nancyann, MD [facility] [phone] - G...  diazepam  5 mg tablet 02/19/2024 30  Hertweck, Nancyann, MD [facility] [phone] - G...  DIAZEPAM  5 MG TABLET 01/22/2024 30 60 each Dohmeier, Dedra, MD [facility] [phone] - G...  DIAZEPAM  5 MG TABLET 12/19/2023 30 60 each Sater, Charlie LABOR, MD [facility] [phone] - G...  DIAZEPAM  5 MG TABLET 11/09/2023 30 60 each Sater, Charlie LABOR, MD [facility] [phone] - G...  DIAZEPAM  5 MG TABLET 10/07/2023 30 60 each Sater, Charlie LABOR, MD [facility] [phone] - G...  DIAZEPAM  5 MG TABLET 09/04/2023 30 60 each Sater, Charlie LABOR, MD [facility] [phone] - G...  DIAZEPAM  5 MG TABLET 08/02/2023 30 60 each Sater, Charlie LABOR, MD [facility] [phone] - G...  DIAZEPAM  5 MG TABLET 07/02/2023 30 60 each Sater, Charlie LABOR, MD [facility] [phone] - G...  diazepam  (VALIUM ) tablet 07/01/2023 90 180 Sater, Charlie LABOR, MD [facility] [phone] - G.SABRASABRA

## 2024-05-27 ENCOUNTER — Other Ambulatory Visit (HOSPITAL_COMMUNITY): Payer: Self-pay

## 2024-05-27 MED ORDER — OPIUM 10 MG/ML (1%) PO TINC
0.6000 mL | Freq: Four times a day (QID) | ORAL | 0 refills | Status: AC
  Filled 2024-05-27: qty 72, 30d supply, fill #0

## 2024-05-29 ENCOUNTER — Other Ambulatory Visit (HOSPITAL_COMMUNITY): Payer: Self-pay

## 2024-06-11 ENCOUNTER — Other Ambulatory Visit (HOSPITAL_COMMUNITY): Payer: Self-pay

## 2024-06-11 MED ORDER — DULOXETINE HCL 60 MG PO CPEP
60.0000 mg | ORAL_CAPSULE | Freq: Every day | ORAL | 2 refills | Status: AC
  Filled 2024-06-11: qty 30, 30d supply, fill #0

## 2024-06-15 ENCOUNTER — Other Ambulatory Visit (HOSPITAL_COMMUNITY): Payer: Self-pay

## 2024-06-16 ENCOUNTER — Other Ambulatory Visit: Payer: Self-pay | Admitting: Neurology

## 2024-06-16 ENCOUNTER — Other Ambulatory Visit (HOSPITAL_COMMUNITY): Payer: Self-pay

## 2024-06-16 NOTE — Telephone Encounter (Signed)
 Last seen on 11/05/23 per note Continue acyclovir  and 5 mg daily prednisone .. No 6 month follow up scheduled

## 2024-06-20 ENCOUNTER — Other Ambulatory Visit: Payer: Self-pay | Admitting: Neurology

## 2024-06-20 DIAGNOSIS — G0490 Encephalitis and encephalomyelitis, unspecified: Secondary | ICD-10-CM

## 2024-06-20 DIAGNOSIS — G8222 Paraplegia, incomplete: Secondary | ICD-10-CM

## 2024-06-22 NOTE — Telephone Encounter (Signed)
 Last seen on 11/05/23 per note Continue acyclovir  and 5 mg daily prednisone .. No 6 month follow up scheduled

## 2024-07-17 ENCOUNTER — Other Ambulatory Visit: Payer: Self-pay | Admitting: Neurology

## 2024-07-20 NOTE — Telephone Encounter (Signed)
 Last seen on 11/05/23 per note  Continue acyclovir  and 5 mg daily prednisone ..  No 6 month follow up scheduled    Dispensed Days Supply Quantity Provider Pharmacy  PREDNISONE  5 MG TABLET 06/16/2024 30 30 each Sater, Charlie LABOR, MD [facility] [phone] - G.SABRASABRA

## 2024-07-21 ENCOUNTER — Other Ambulatory Visit: Payer: Self-pay | Admitting: Neurology

## 2024-07-21 DIAGNOSIS — G0490 Encephalitis and encephalomyelitis, unspecified: Secondary | ICD-10-CM

## 2024-07-21 DIAGNOSIS — G8222 Paraplegia, incomplete: Secondary | ICD-10-CM

## 2024-07-25 ENCOUNTER — Other Ambulatory Visit: Payer: Self-pay | Admitting: Neurology

## 2024-07-25 DIAGNOSIS — G0490 Encephalitis and encephalomyelitis, unspecified: Secondary | ICD-10-CM

## 2024-07-25 DIAGNOSIS — G8222 Paraplegia, incomplete: Secondary | ICD-10-CM

## 2024-08-19 ENCOUNTER — Other Ambulatory Visit: Payer: Self-pay | Admitting: Neurology

## 2024-08-19 DIAGNOSIS — G8222 Paraplegia, incomplete: Secondary | ICD-10-CM

## 2024-08-19 DIAGNOSIS — G0490 Encephalitis and encephalomyelitis, unspecified: Secondary | ICD-10-CM

## 2024-08-22 ENCOUNTER — Emergency Department (HOSPITAL_COMMUNITY)

## 2024-08-22 ENCOUNTER — Encounter (HOSPITAL_COMMUNITY): Payer: Self-pay | Admitting: Emergency Medicine

## 2024-08-22 ENCOUNTER — Emergency Department (HOSPITAL_COMMUNITY)
Admission: EM | Admit: 2024-08-22 | Discharge: 2024-08-22 | Disposition: A | Discharge status: Home / Self Care | Attending: Emergency Medicine | Admitting: Emergency Medicine

## 2024-08-22 ENCOUNTER — Other Ambulatory Visit: Payer: Self-pay

## 2024-08-22 DIAGNOSIS — M79605 Pain in left leg: Secondary | ICD-10-CM | POA: Diagnosis not present

## 2024-08-22 DIAGNOSIS — Z7982 Long term (current) use of aspirin: Secondary | ICD-10-CM | POA: Diagnosis not present

## 2024-08-22 DIAGNOSIS — M79604 Pain in right leg: Secondary | ICD-10-CM | POA: Diagnosis present

## 2024-08-22 DIAGNOSIS — M792 Neuralgia and neuritis, unspecified: Secondary | ICD-10-CM

## 2024-08-22 DIAGNOSIS — G9340 Encephalopathy, unspecified: Secondary | ICD-10-CM

## 2024-08-22 DIAGNOSIS — D72829 Elevated white blood cell count, unspecified: Secondary | ICD-10-CM | POA: Insufficient documentation

## 2024-08-22 LAB — CBC WITH DIFFERENTIAL/PLATELET
Abs Immature Granulocytes: 0.07 K/uL (ref 0.00–0.07)
Basophils Absolute: 0.1 K/uL (ref 0.0–0.1)
Basophils Relative: 1 %
Eosinophils Absolute: 0.1 K/uL (ref 0.0–0.5)
Eosinophils Relative: 1 %
HCT: 48.4 % — ABNORMAL HIGH (ref 36.0–46.0)
Hemoglobin: 15.8 g/dL — ABNORMAL HIGH (ref 12.0–15.0)
Immature Granulocytes: 1 %
Lymphocytes Relative: 29 %
Lymphs Abs: 3.1 K/uL (ref 0.7–4.0)
MCH: 33.7 pg (ref 26.0–34.0)
MCHC: 32.6 g/dL (ref 30.0–36.0)
MCV: 103.2 fL — ABNORMAL HIGH (ref 80.0–100.0)
Monocytes Absolute: 0.5 K/uL (ref 0.1–1.0)
Monocytes Relative: 4 %
Neutro Abs: 6.8 K/uL (ref 1.7–7.7)
Neutrophils Relative %: 64 %
Platelets: 199 K/uL (ref 150–400)
RBC: 4.69 MIL/uL (ref 3.87–5.11)
RDW: 14.6 % (ref 11.5–15.5)
WBC: 10.6 K/uL — ABNORMAL HIGH (ref 4.0–10.5)
nRBC: 0 % (ref 0.0–0.2)

## 2024-08-22 LAB — COMPREHENSIVE METABOLIC PANEL WITH GFR
ALT: 32 U/L (ref 0–44)
AST: 35 U/L (ref 15–41)
Albumin: 3.7 g/dL (ref 3.5–5.0)
Alkaline Phosphatase: 99 U/L (ref 38–126)
Anion gap: 9 (ref 5–15)
BUN: 14 mg/dL (ref 8–23)
CO2: 30 mmol/L (ref 22–32)
Calcium: 9.5 mg/dL (ref 8.9–10.3)
Chloride: 97 mmol/L — ABNORMAL LOW (ref 98–111)
Creatinine, Ser: 0.6 mg/dL (ref 0.44–1.00)
GFR, Estimated: >60 mL/min
Glucose, Bld: 108 mg/dL — ABNORMAL HIGH (ref 70–99)
Potassium: 4.5 mmol/L (ref 3.5–5.1)
Sodium: 136 mmol/L (ref 135–145)
Total Bilirubin: 0.4 mg/dL (ref 0.0–1.2)
Total Protein: 6.6 g/dL (ref 6.5–8.1)

## 2024-08-22 LAB — CBG MONITORING, ED: Glucose-Capillary: 115 mg/dL — ABNORMAL HIGH (ref 70–99)

## 2024-08-22 LAB — CK: Total CK: 24 U/L — ABNORMAL LOW (ref 38–234)

## 2024-08-22 MED ORDER — PREGABALIN 100 MG PO CAPS: 100.0000 mg | ORAL_CAPSULE | Freq: Two times a day (BID) | ORAL | 0 refills | Status: AC

## 2024-08-22 MED ORDER — DIPHENHYDRAMINE HCL 25 MG PO CAPS
25.0000 mg | ORAL_CAPSULE | Freq: Once | ORAL | Status: AC
  Administered 2024-08-22: 25 mg via ORAL
  Filled 2024-08-22: qty 1

## 2024-08-22 MED ORDER — HYDROMORPHONE HCL 1 MG/ML IJ SOLN
0.5000 mg | Freq: Once | INTRAMUSCULAR | Status: AC
  Administered 2024-08-22: 0.5 mg via INTRAVENOUS
  Filled 2024-08-22: qty 1

## 2024-08-22 MED ORDER — METHOCARBAMOL 1000 MG/10ML IJ SOLN
1000.0000 mg | Freq: Once | INTRAMUSCULAR | Status: AC
  Administered 2024-08-22: 1000 mg via INTRAVENOUS
  Filled 2024-08-22: qty 10

## 2024-08-22 MED ORDER — GADOBUTROL 1 MMOL/ML IV SOLN
7.5000 mL | Freq: Once | INTRAVENOUS | Status: AC | PRN
  Administered 2024-08-22: 7.5 mL via INTRAVENOUS

## 2024-08-22 MED ORDER — HYDROMORPHONE HCL 2 MG PO TABS: 1.0000 mg | ORAL_TABLET | ORAL | 0 refills | Status: AC | PRN

## 2024-08-22 NOTE — ED Triage Notes (Signed)
 Patient arrives via [facility] from home for bilateral leg and arm pain described as burning. Hx of chronic nerve pain - hospice advised husband to double Lyrica  dose. 100mg  Lyrica , hydrocodone , and tylenol  given at 8pm with minimal relief.

## 2024-08-22 NOTE — Discharge Instructions (Signed)
 Continue to increase Lyrica  dose as we discussed.  I did write for a refill.  You can also take the oral Dilaudid  tablets to help with your pain and discomfort.  Follow-up with your neurologist to be rechecked and discussed further treatment options

## 2024-08-22 NOTE — ED Provider Notes (Signed)
 Patient initially seen by Dr. Trine and PA Braxton.  Please see their note  Plan was to follow-up on the MRI  Case discussed with Dr Michaela regarding MRI findings.  No acute findings noted on MRI.  Patient's MRI findings have been previously noted on previous MRIs.  Recommend continued outpatient management with her neurologist  Will have patient increase to her increased dose of Lyrica .  I will give a short-term dose of hydromorphone  tablets which she tolerated here.   Randol Simmonds, MD 08/22/24 [phone]

## 2024-08-22 NOTE — ED Provider Notes (Signed)
 " [place] [facility] AT [facility] Provider Note   CSN: [id number] Arrival date & time: 08/22/24  9973     Patient presents with: Leg Pain   Katelyn Lamb is a 78 y.o. female. with medical history of neurogenic bladder, encephalomyelitis, chronic disease, anxiety, acute DVT of popliteal vein of left lower extremity, PEs, memory difficulty, chronic low back pain, incomplete paraplegia, chronic pain syndrome, history of ESBL E. coli infection, chronic suprapubic catheter, seizure disorder. Patient presents to the ED via [facility] from home for bilateral leg burning and pain that started today.  She also endorses numbness and tingling to both legs.  Patient notes this has been intermittent however this episode is more severe today.  She first experienced the symptoms 8 years ago.  Patient underwent multiple MRI and scans before being diagnosed with encephalitis. she denies any weakness.  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  Denies recent infections.  Hospice advise husband to double Lyrica  dose as well as give hydrocodone  and Tylenol  for her symptoms.  She states that it did not provide relief.  Her blood pressure is usually in the mid 90s systolic.  Patient has a indwelling cath at home.  Her oxygen  at home is usually around 90s but does have at home oxygen  2 use as needed.  She denies any fevers, chills, shortness of breath, chest pain, weakness, or any other symptoms at this time. Pt is bedbound at baseline.  Husband notes recent memory issues. Under hospice care for resources.         Leg Pain      Prior to Admission medications  Medication Sig Start Date End Date Taking? Authorizing Provider  acyclovir  (ZOVIRAX ) 400 MG tablet TAKE 1 TABLET BY MOUTH TWICE A DAY 08/19/24   Sater, Charlie LABOR, MD  albuterol  (PROVENTIL ) (2.5 MG/3ML) 0.083% nebulizer solution Take 3 mLs (2.5 mg total) by nebulization  every 2 (two) hours as needed for wheezing or shortness of breath. 08/15/21   Ghimire, Donalda HERO, MD  Ascorbic Acid (VITAMIN C) 500 MG CHEW Chew 500 mg by mouth daily.    [provider]  aspirin  EC 325 MG tablet Take 325 mg by mouth daily. 10/11/23   [provider]  Baclofen  5 MG TABS Take 5 mg by mouth 3 (three) times daily. 01/12/24   [provider]  Calcium  Carb-Cholecalciferol  (CALCIUM  600+D3) 600-5 MG-MCG TABS Take 1 tablet by mouth daily at 12 noon.    [provider]  cholestyramine  (QUESTRAN ) 4 GM/DOSE powder Take 4 g by mouth 2 (two) times daily. 12/02/23   [provider]  cyanocobalamin  (VITAMIN B12) 1000 MCG tablet Take 1,000 mcg by mouth daily.    [provider]  diazepam  (VALIUM ) 5 MG tablet TAKE 1 TABLET BY MOUTH EVERY 12 HOURS AS NEEDED (FOR TREMORS). 05/26/24   Dohmeier, Dedra, MD  diclofenac  Sodium (VOLTAREN ) 1 % GEL Apply 2 g topically 4 (four) times daily as needed (pain).    [provider]  diphenoxylate -atropine  (LOMOTIL ) 2.5-0.025 MG tablet Take 1 tablet by mouth 2 (two) times daily as needed for diarrhea or loose stools. 01/04/24   [provider]  docusate sodium  (COLACE) 100 MG capsule Take 200 mg by mouth daily. 12/18/23   [provider]  DULoxetine  (CYMBALTA ) 60 MG capsule Take 1 capsule (60 mg total) by mouth at bedtime. Call [phone] to schedule appt around 05/2024  for ongoing refills 01/20/24   Sater, Charlie LABOR, MD  DULoxetine  (CYMBALTA ) 60 MG capsule Take 1 capsule (60 mg total) by mouth daily. 06/11/24     ferrous sulfate  325 (65 FE) MG tablet Take 325 mg by mouth every morning. 03/10/21   [provider]  fluticasone -salmeterol (ADVAIR) 100-50 MCG/ACT AEPB Inhale 1 puff into the lungs 2 (two) times daily. 02/15/23   Kassie Acquanetta Bradley, MD  HYDROcodone -acetaminophen  (NORCO) 10-325 MG tablet Take 1 tablet by mouth every 6 (six) hours as needed for moderate pain (pain score 4-6).     [provider]  lactobacillus acidophilus (BACID) TABS tablet Take 1 tablet by mouth daily at 12 noon.    [provider]  MELATONIN MAXIMUM STRENGTH 5 MG TABS Take 5 mg by mouth at bedtime. 12/11/23   [provider]  midodrine  (PROAMATINE ) 10 MG tablet Take 1 tablet (10 mg total) by mouth 3 (three) times daily with meals. 01/16/24   Arloa Folks D, NP  mirabegron  ER (MYRBETRIQ ) 50 MG TB24 tablet Take 50 mg by mouth daily.    [provider]  mirtazapine  (REMERON ) 30 MG tablet Take 30 mg by mouth at bedtime. 12/11/23   [provider]  omega-3 acid ethyl esters (LOVAZA ) 1 g capsule Take 1 g by mouth every morning. Patient not taking: Reported on 01/14/2024    [provider]  Opium  10 MG/ML (1%) TINC Take 0.6 mLs by mouth in the morning, at noon, in the evening, and at bedtime. 01/11/24   [provider]  Opium  10 MG/ML (1%) TINC Take 0.6 mLs (6 mg total) by mouth 4 (four) times daily. 05/27/24     pantoprazole  (PROTONIX ) 40 MG tablet Take 1 tablet (40 mg total) by mouth daily. Patient taking differently: Take 40 mg by mouth at bedtime. 10/26/16   Angiulli, Toribio PARAS, PA-C  predniSONE  (DELTASONE ) 5 MG tablet TAKE 1 TABLET BY MOUTH EVERY DAY WITH BREAKFAST 07/20/24   Sater, Charlie LABOR, MD  pregabalin  (LYRICA ) 50 MG capsule TAKE 1 CAPSULE BY MOUTH TWICE A DAY 05/12/24   Sater, Charlie LABOR, MD  temazepam  (RESTORIL ) 15 MG capsule Take 15 mg by mouth at bedtime as needed for sleep. 10/29/23   [provider]  traZODone  (DESYREL ) 50 MG tablet TAKE 1 TABLET BY MOUTH EVERYDAY AT BEDTIME Patient taking differently: Take 50 mg by mouth at bedtime. 12/30/23   Sater, Charlie LABOR, MD    Allergies: Demerol [meperidine], Hydrocodone -acetaminophen , Percocet [oxycodone -acetaminophen ], Lamotrigine , and Penicillins    Review of Systems  Updated Vital Signs BP 117/69   Pulse 78   Temp 98 F (36.7 C) (Oral)   Resp 13   Ht 5' 2 (1.575 m)   Wt 72.7 kg    SpO2 96%   BMI 29.31 kg/m   Physical Exam Vitals and nursing note reviewed.  Constitutional:      General: She is not in acute distress.    Appearance: She is well-developed.  HENT:     Head: Normocephalic and atraumatic.  Eyes:     Conjunctiva/sclera: Conjunctivae normal.  Cardiovascular:     Rate and Rhythm: Normal rate and regular rhythm.     Heart sounds: No murmur heard. Pulmonary:     Effort: Pulmonary effort is normal. No respiratory distress.     Breath sounds: Normal breath sounds.  Abdominal:     Palpations: Abdomen is soft.     Tenderness: There is no abdominal tenderness.  Genitourinary:    Comments: Indwelling  catheter in place Musculoskeletal:        General: No swelling.     Cervical back: Neck supple.     Right lower leg: Normal. No swelling, deformity or tenderness. No edema.     Left lower leg: Normal. No swelling, deformity or tenderness. No edema.     Right ankle: Normal. No swelling or deformity. No tenderness. Normal range of motion.     Right Achilles Tendon: No tenderness.     Left ankle: Normal. No swelling. No tenderness. Normal range of motion.     Left Achilles Tendon: No tenderness.     Comments: 5 out of 5 strength to plantarflexion, dorsiflexion,  Negative straight leg raise.   Skin:    General: Skin is warm and dry.     Capillary Refill: Capillary refill takes less than 2 seconds.  Neurological:     General: No focal deficit present.     Mental Status: She is alert and oriented to person, place, and time.     Sensory: Sensation is intact.     Comments: Speech is clear and goal oriented, follows commands Major Cranial nerves without deficit, no facial droop Normal strength in upper and lower extremities bilaterally including dorsiflexion and plantar flexion, strong and equal grip strength Sensation normal to light and sharp touch Moves extremities without ataxia, coordination intact Normal finger to nose and rapid alternating  movements   Psychiatric:        Mood and Affect: Mood normal.     (all labs ordered are listed, but only abnormal results are displayed) Labs Reviewed  CK - Abnormal; Notable for the following components:      Result Value   Total CK 24 (*)    All other components within normal limits  CBC WITH DIFFERENTIAL/PLATELET - Abnormal; Notable for the following components:   WBC 10.6 (*)    Hemoglobin 15.8 (*)    HCT 48.4 (*)    MCV 103.2 (*)    All other components within normal limits  COMPREHENSIVE METABOLIC PANEL WITH GFR - Abnormal; Notable for the following components:   Chloride 97 (*)    Glucose, Bld 108 (*)    All other components within normal limits  CBG MONITORING, ED - Abnormal; Notable for the following components:   Glucose-Capillary 115 (*)    All other components within normal limits    EKG: None  Radiology: No results found.   Procedures   Medications Ordered in the ED  HYDROmorphone  (DILAUDID ) injection 0.5 mg (0 mg Intravenous Hold 08/22/24 0413)  methocarbamol  (ROBAXIN ) injection 1,000 mg (1,000 mg Intravenous Given 08/22/24 0306)                                    Medical Decision Making Amount and/or Complexity of Data Reviewed Labs: ordered. Radiology: ordered.  Risk Prescription drug management.    This patient presents to the ED for concern of burning and pain in bilateral legs, this involves an extensive number of treatment options, and is a complaint that carries with it a high risk of complications and morbidity.     Co morbidities / Chronic conditions that complicate the patient evaluation  HSV encephalitis, DVT, incomplete paraplegia, PE   Additional history obtained:  Additional history obtained from EMR External records from outside source obtained and reviewed including multiple MRIs in the past.  Also reviewed notes from 01/13/2024 where patient was discharged  after urinary infection.   Lab Tests:  I Ordered, and  personally interpreted labs.  The pertinent results include: Kidney function normal, mild leukocytosis, Low CK   Imaging Studies ordered:  I ordered imaging studies including MRI of thoracic, lumbar, cervical, brain which is pending. I agree with the radiologist interpretation   Cardiac Monitoring: / EKG:  The patient was maintained on a cardiac monitor.  I personally viewed and interpreted the cardiac monitored which showed an underlying rhythm of: NSR   Problem List / ED Course / Critical interventions / Medication management  Patient with a history HSV encephalitis under hospice care presents to the ED with burning and pain to bilateral legs.  On examination she is alert oriented.  No focal deficit and benign physical examination.  Initially the patient was hypotensive however this improved upon reevaluation.  I did provide patient Robaxin  and Dilaudid  injection for her symptoms.  Upon reevaluation, the patient and her symptoms after administration of Robaxin . Dilaudid  was on hold due to hypotension and improvement in pain.  Lab work was negative.  Will obtain MRI thoracic, cervical and brain for further evaluation of myelitis. if negative, patient is comfortable going home and treating her symptoms. I ordered medication including Robaxin  Reevaluation of the patient after these medicines showed that the patient improved I have reviewed the patients home medicines and have made adjustments as needed  Patient was reviewed in conjunction with Dr. Trine     Social Determinants of Health:  Under hospice care for resources but lives at home with husband    Accepted handoff at shift change from Dr. Randol MD. Please see prior provider note for more detail.   Briefly: Patient is 78 y.o. with a history of HSV encephalitis, hosp under hospice care presents today for bilateral leg pain and burning.  Was given Dilaudid  and Robaxin  with relief.  Ordered MRI thoracic, lumbar, cervical, brain  for further evaluation.   Plan: Waiting on MRI results.  Discharge patient if MRI is negative.          Final diagnoses:  None    ED Discharge Orders     None          Braxton Dubois, PA-C 08/22/24 0710    Trine Raynell Moder, MD 08/22/24 [phone]  "

## 2024-08-22 NOTE — ED Notes (Signed)
 Patient transported to MRI

## 2024-08-23 ENCOUNTER — Other Ambulatory Visit: Payer: Self-pay | Admitting: Neurology
